# Patient Record
Sex: Male | Born: 1935 | Race: White | Hispanic: No | Marital: Married | Smoking: Former smoker
Health system: Southern US, Community
[De-identification: ages and names within clinical notes are randomized; demographics above are authoritative.]

## PROBLEM LIST (undated history)

## (undated) DIAGNOSIS — I5032 Chronic diastolic (congestive) heart failure: Secondary | ICD-10-CM

## (undated) DIAGNOSIS — I10 Essential (primary) hypertension: Secondary | ICD-10-CM

## (undated) DIAGNOSIS — I350 Nonrheumatic aortic (valve) stenosis: Secondary | ICD-10-CM

## (undated) DIAGNOSIS — E119 Type 2 diabetes mellitus without complications: Secondary | ICD-10-CM

## (undated) DIAGNOSIS — R112 Nausea with vomiting, unspecified: Secondary | ICD-10-CM

## (undated) DIAGNOSIS — I2510 Atherosclerotic heart disease of native coronary artery without angina pectoris: Secondary | ICD-10-CM

## (undated) DIAGNOSIS — D509 Iron deficiency anemia, unspecified: Secondary | ICD-10-CM

## (undated) DIAGNOSIS — N400 Enlarged prostate without lower urinary tract symptoms: Secondary | ICD-10-CM

## (undated) DIAGNOSIS — E663 Overweight: Secondary | ICD-10-CM

## (undated) DIAGNOSIS — K222 Esophageal obstruction: Secondary | ICD-10-CM

## (undated) DIAGNOSIS — M1990 Unspecified osteoarthritis, unspecified site: Secondary | ICD-10-CM

## (undated) DIAGNOSIS — K635 Polyp of colon: Secondary | ICD-10-CM

## (undated) DIAGNOSIS — M545 Low back pain, unspecified: Secondary | ICD-10-CM

## (undated) DIAGNOSIS — N183 Chronic kidney disease, stage 3 unspecified: Secondary | ICD-10-CM

## (undated) DIAGNOSIS — Z9289 Personal history of other medical treatment: Secondary | ICD-10-CM

## (undated) DIAGNOSIS — G629 Polyneuropathy, unspecified: Secondary | ICD-10-CM

## (undated) DIAGNOSIS — R0600 Dyspnea, unspecified: Secondary | ICD-10-CM

## (undated) DIAGNOSIS — M059 Rheumatoid arthritis with rheumatoid factor, unspecified: Secondary | ICD-10-CM

## (undated) DIAGNOSIS — Z87442 Personal history of urinary calculi: Secondary | ICD-10-CM

## (undated) DIAGNOSIS — E785 Hyperlipidemia, unspecified: Secondary | ICD-10-CM

## (undated) DIAGNOSIS — Z8669 Personal history of other diseases of the nervous system and sense organs: Secondary | ICD-10-CM

## (undated) DIAGNOSIS — Z9981 Dependence on supplemental oxygen: Secondary | ICD-10-CM

## (undated) DIAGNOSIS — K5790 Diverticulosis of intestine, part unspecified, without perforation or abscess without bleeding: Secondary | ICD-10-CM

## (undated) DIAGNOSIS — R011 Cardiac murmur, unspecified: Secondary | ICD-10-CM

## (undated) DIAGNOSIS — J849 Interstitial pulmonary disease, unspecified: Secondary | ICD-10-CM

## (undated) DIAGNOSIS — K449 Diaphragmatic hernia without obstruction or gangrene: Secondary | ICD-10-CM

## (undated) DIAGNOSIS — Z972 Presence of dental prosthetic device (complete) (partial): Secondary | ICD-10-CM

## (undated) DIAGNOSIS — K219 Gastro-esophageal reflux disease without esophagitis: Secondary | ICD-10-CM

## (undated) DIAGNOSIS — M069 Rheumatoid arthritis, unspecified: Secondary | ICD-10-CM

## (undated) DIAGNOSIS — I219 Acute myocardial infarction, unspecified: Secondary | ICD-10-CM

## (undated) DIAGNOSIS — U071 COVID-19: Secondary | ICD-10-CM

## (undated) DIAGNOSIS — C801 Malignant (primary) neoplasm, unspecified: Secondary | ICD-10-CM

## (undated) DIAGNOSIS — Z973 Presence of spectacles and contact lenses: Secondary | ICD-10-CM

## (undated) DIAGNOSIS — M810 Age-related osteoporosis without current pathological fracture: Secondary | ICD-10-CM

## (undated) DIAGNOSIS — H269 Unspecified cataract: Secondary | ICD-10-CM

## (undated) DIAGNOSIS — G8929 Other chronic pain: Secondary | ICD-10-CM

## (undated) HISTORY — DX: Rheumatoid arthritis with rheumatoid factor, unspecified: M05.9

## (undated) HISTORY — DX: Personal history of other medical treatment: Z92.89

## (undated) HISTORY — DX: Gastro-esophageal reflux disease without esophagitis: K21.9

## (undated) HISTORY — PX: HAND SURGERY: SHX662

## (undated) HISTORY — DX: Nausea with vomiting, unspecified: R11.2

## (undated) HISTORY — DX: Rheumatoid arthritis, unspecified: M06.9

## (undated) HISTORY — DX: Unspecified osteoarthritis, unspecified site: M19.90

## (undated) HISTORY — DX: Overweight: E66.3

## (undated) HISTORY — DX: Atherosclerotic heart disease of native coronary artery without angina pectoris: I25.10

## (undated) HISTORY — DX: Unspecified cataract: H26.9

## (undated) HISTORY — DX: Esophageal obstruction: K22.2

## (undated) HISTORY — DX: Diaphragmatic hernia without obstruction or gangrene: K44.9

## (undated) HISTORY — DX: Age-related osteoporosis without current pathological fracture: M81.0

## (undated) HISTORY — PX: JOINT REPLACEMENT: SHX530

## (undated) HISTORY — DX: Polyp of colon: K63.5

## (undated) HISTORY — DX: Interstitial pulmonary disease, unspecified: J84.9

## (undated) HISTORY — DX: Diverticulosis of intestine, part unspecified, without perforation or abscess without bleeding: K57.90

## (undated) HISTORY — DX: Essential (primary) hypertension: I10

## (undated) HISTORY — PX: CATARACT EXTRACTION W/ INTRAOCULAR LENS  IMPLANT, BILATERAL: SHX1307

## (undated) HISTORY — DX: Dyspnea, unspecified: R06.00

## (undated) HISTORY — DX: Hyperlipidemia, unspecified: E78.5

---

## 1998-01-22 ENCOUNTER — Encounter: Payer: Self-pay | Admitting: Emergency Medicine

## 1998-01-22 ENCOUNTER — Emergency Department (HOSPITAL_COMMUNITY): Admission: EM | Admit: 1998-01-22 | Discharge: 1998-01-22 | Payer: Self-pay | Admitting: Emergency Medicine

## 1998-07-01 ENCOUNTER — Encounter: Payer: Self-pay | Admitting: Urology

## 1998-07-01 ENCOUNTER — Ambulatory Visit (HOSPITAL_COMMUNITY): Admission: RE | Admit: 1998-07-01 | Discharge: 1998-07-01 | Payer: Self-pay | Admitting: Urology

## 1999-10-30 ENCOUNTER — Ambulatory Visit (HOSPITAL_COMMUNITY): Admission: RE | Admit: 1999-10-30 | Discharge: 1999-10-30 | Payer: Self-pay | Admitting: Family Medicine

## 1999-10-30 ENCOUNTER — Encounter: Payer: Self-pay | Admitting: Family Medicine

## 1999-11-04 HISTORY — PX: CORONARY ARTERY BYPASS GRAFT: SHX141

## 1999-11-06 ENCOUNTER — Encounter: Admission: RE | Admit: 1999-11-06 | Discharge: 1999-11-06 | Payer: Self-pay | Admitting: Family Medicine

## 1999-11-06 ENCOUNTER — Encounter: Payer: Self-pay | Admitting: Family Medicine

## 1999-11-30 ENCOUNTER — Encounter: Payer: Self-pay | Admitting: Neurosurgery

## 1999-11-30 ENCOUNTER — Ambulatory Visit (HOSPITAL_COMMUNITY): Admission: RE | Admit: 1999-11-30 | Discharge: 1999-11-30 | Payer: Self-pay | Admitting: Neurosurgery

## 1999-12-14 ENCOUNTER — Ambulatory Visit (HOSPITAL_COMMUNITY): Admission: RE | Admit: 1999-12-14 | Discharge: 1999-12-14 | Payer: Self-pay | Admitting: Neurosurgery

## 1999-12-14 ENCOUNTER — Encounter: Payer: Self-pay | Admitting: Neurosurgery

## 1999-12-29 ENCOUNTER — Ambulatory Visit (HOSPITAL_COMMUNITY): Admission: RE | Admit: 1999-12-29 | Discharge: 1999-12-29 | Payer: Self-pay | Admitting: Neurosurgery

## 1999-12-29 ENCOUNTER — Encounter: Payer: Self-pay | Admitting: Neurosurgery

## 2000-01-19 ENCOUNTER — Ambulatory Visit (HOSPITAL_COMMUNITY): Admission: RE | Admit: 2000-01-19 | Discharge: 2000-01-19 | Payer: Self-pay | Admitting: Vascular Surgery

## 2000-01-19 ENCOUNTER — Encounter: Payer: Self-pay | Admitting: Vascular Surgery

## 2000-11-16 ENCOUNTER — Encounter: Payer: Self-pay | Admitting: Cardiology

## 2000-11-17 ENCOUNTER — Encounter: Payer: Self-pay | Admitting: Thoracic Surgery (Cardiothoracic Vascular Surgery)

## 2000-11-17 ENCOUNTER — Inpatient Hospital Stay (HOSPITAL_COMMUNITY): Admission: RE | Admit: 2000-11-17 | Discharge: 2000-11-21 | Payer: Self-pay | Admitting: Cardiology

## 2000-11-18 ENCOUNTER — Encounter: Payer: Self-pay | Admitting: Thoracic Surgery (Cardiothoracic Vascular Surgery)

## 2000-11-19 ENCOUNTER — Encounter: Payer: Self-pay | Admitting: Thoracic Surgery (Cardiothoracic Vascular Surgery)

## 2000-12-12 ENCOUNTER — Encounter: Admission: RE | Admit: 2000-12-12 | Discharge: 2000-12-12 | Payer: Self-pay | Admitting: Cardiovascular Disease

## 2001-06-07 ENCOUNTER — Encounter: Admission: RE | Admit: 2001-06-07 | Discharge: 2001-06-07 | Payer: Self-pay | Admitting: Internal Medicine

## 2001-06-07 ENCOUNTER — Encounter: Payer: Self-pay | Admitting: Internal Medicine

## 2001-09-25 ENCOUNTER — Encounter: Payer: Self-pay | Admitting: Internal Medicine

## 2001-12-13 ENCOUNTER — Encounter: Admission: RE | Admit: 2001-12-13 | Discharge: 2001-12-13 | Payer: Self-pay | Admitting: Internal Medicine

## 2003-01-23 ENCOUNTER — Encounter: Admission: RE | Admit: 2003-01-23 | Discharge: 2003-01-23 | Payer: Self-pay | Admitting: Orthopedic Surgery

## 2003-01-23 ENCOUNTER — Encounter: Payer: Self-pay | Admitting: Orthopedic Surgery

## 2003-02-22 ENCOUNTER — Encounter: Admission: RE | Admit: 2003-02-22 | Discharge: 2003-02-22 | Payer: Self-pay | Admitting: Orthopedic Surgery

## 2003-02-25 ENCOUNTER — Ambulatory Visit (HOSPITAL_BASED_OUTPATIENT_CLINIC_OR_DEPARTMENT_OTHER): Admission: RE | Admit: 2003-02-25 | Discharge: 2003-02-25 | Payer: Self-pay | Admitting: Orthopedic Surgery

## 2003-02-25 ENCOUNTER — Ambulatory Visit (HOSPITAL_COMMUNITY): Admission: RE | Admit: 2003-02-25 | Discharge: 2003-02-25 | Payer: Self-pay | Admitting: Orthopedic Surgery

## 2003-03-19 ENCOUNTER — Encounter: Admission: RE | Admit: 2003-03-19 | Discharge: 2003-06-17 | Payer: Self-pay | Admitting: Internal Medicine

## 2003-11-04 HISTORY — PX: CHOLECYSTECTOMY OPEN: SUR202

## 2004-02-28 ENCOUNTER — Ambulatory Visit: Payer: Self-pay | Admitting: Internal Medicine

## 2004-05-01 ENCOUNTER — Ambulatory Visit: Payer: Self-pay | Admitting: Internal Medicine

## 2004-05-21 ENCOUNTER — Ambulatory Visit: Payer: Self-pay | Admitting: Internal Medicine

## 2004-08-03 HISTORY — PX: CARDIAC CATHETERIZATION: SHX172

## 2004-08-17 ENCOUNTER — Inpatient Hospital Stay (HOSPITAL_COMMUNITY): Admission: EM | Admit: 2004-08-17 | Discharge: 2004-08-20 | Payer: Self-pay | Admitting: Emergency Medicine

## 2004-08-17 ENCOUNTER — Ambulatory Visit: Payer: Self-pay | Admitting: Family Medicine

## 2004-08-18 ENCOUNTER — Ambulatory Visit: Payer: Self-pay | Admitting: Cardiovascular Disease

## 2004-08-27 ENCOUNTER — Ambulatory Visit: Payer: Self-pay | Admitting: Internal Medicine

## 2004-09-03 ENCOUNTER — Ambulatory Visit: Payer: Self-pay | Admitting: Internal Medicine

## 2004-10-12 ENCOUNTER — Ambulatory Visit: Payer: Self-pay | Admitting: Internal Medicine

## 2004-11-06 ENCOUNTER — Ambulatory Visit: Payer: Self-pay | Admitting: Cardiovascular Disease

## 2004-12-11 ENCOUNTER — Ambulatory Visit: Payer: Self-pay | Admitting: Internal Medicine

## 2004-12-17 ENCOUNTER — Ambulatory Visit: Payer: Self-pay | Admitting: Internal Medicine

## 2004-12-31 ENCOUNTER — Ambulatory Visit: Payer: Self-pay | Admitting: Internal Medicine

## 2004-12-31 ENCOUNTER — Ambulatory Visit (HOSPITAL_COMMUNITY): Admission: RE | Admit: 2004-12-31 | Discharge: 2004-12-31 | Payer: Self-pay | Admitting: Internal Medicine

## 2005-01-07 ENCOUNTER — Ambulatory Visit: Payer: Self-pay | Admitting: Cardiovascular Disease

## 2005-01-15 ENCOUNTER — Ambulatory Visit: Payer: Self-pay | Admitting: Internal Medicine

## 2005-01-23 ENCOUNTER — Ambulatory Visit: Payer: Self-pay

## 2005-03-02 ENCOUNTER — Ambulatory Visit: Payer: Self-pay | Admitting: Internal Medicine

## 2005-08-31 ENCOUNTER — Ambulatory Visit: Payer: Self-pay | Admitting: Internal Medicine

## 2005-10-19 ENCOUNTER — Ambulatory Visit: Payer: Self-pay | Admitting: Internal Medicine

## 2005-11-10 ENCOUNTER — Ambulatory Visit: Payer: Self-pay | Admitting: Internal Medicine

## 2005-11-17 ENCOUNTER — Ambulatory Visit: Payer: Self-pay | Admitting: Internal Medicine

## 2006-02-02 ENCOUNTER — Ambulatory Visit: Payer: Self-pay | Admitting: Internal Medicine

## 2006-03-17 ENCOUNTER — Ambulatory Visit: Payer: Self-pay | Admitting: Internal Medicine

## 2006-04-05 HISTORY — PX: KNEE ARTHROSCOPY: SHX127

## 2006-04-08 ENCOUNTER — Ambulatory Visit: Payer: Self-pay | Admitting: Family Medicine

## 2006-04-19 ENCOUNTER — Ambulatory Visit: Payer: Self-pay | Admitting: Cardiovascular Disease

## 2006-04-29 ENCOUNTER — Encounter: Payer: Self-pay | Admitting: Cardiology

## 2006-04-29 ENCOUNTER — Ambulatory Visit: Payer: Self-pay

## 2006-05-17 ENCOUNTER — Ambulatory Visit: Payer: Self-pay | Admitting: Internal Medicine

## 2006-05-17 LAB — CONVERTED CEMR LAB
Basophils Relative: 0 % (ref 0.0–1.0)
Eosinophils Relative: 5.7 % — ABNORMAL HIGH (ref 0.0–5.0)
Hemoglobin: 14.4 g/dL (ref 13.0–17.0)
Lymphocytes Relative: 35.8 % (ref 12.0–46.0)
Monocytes Absolute: 0.7 10*3/uL (ref 0.2–0.7)
Neutro Abs: 3.6 10*3/uL (ref 1.4–7.7)
RDW: 13.7 % (ref 11.5–14.6)
Sed Rate: 11 mm/hr (ref 0–20)
WBC: 7.3 10*3/uL (ref 4.5–10.5)

## 2006-09-23 DIAGNOSIS — I10 Essential (primary) hypertension: Secondary | ICD-10-CM

## 2006-09-23 DIAGNOSIS — E7849 Other hyperlipidemia: Secondary | ICD-10-CM

## 2006-09-23 DIAGNOSIS — K219 Gastro-esophageal reflux disease without esophagitis: Secondary | ICD-10-CM

## 2006-09-23 DIAGNOSIS — N400 Enlarged prostate without lower urinary tract symptoms: Secondary | ICD-10-CM

## 2006-10-17 ENCOUNTER — Ambulatory Visit: Payer: Self-pay | Admitting: Cardiovascular Disease

## 2006-10-17 ENCOUNTER — Ambulatory Visit: Payer: Self-pay | Admitting: Internal Medicine

## 2006-10-17 DIAGNOSIS — G479 Sleep disorder, unspecified: Secondary | ICD-10-CM | POA: Insufficient documentation

## 2006-10-18 LAB — CONVERTED CEMR LAB
ALT: 19 units/L (ref 0–53)
Albumin: 4 g/dL (ref 3.5–5.2)
BUN: 24 mg/dL — ABNORMAL HIGH (ref 6–23)
CO2: 30 meq/L (ref 19–32)
Cholesterol: 151 mg/dL (ref 0–200)
Creatinine, Ser: 1.1 mg/dL (ref 0.4–1.5)
Creatinine,U: 157.8 mg/dL
Hgb A1c MFr Bld: 6.7 % — ABNORMAL HIGH (ref 4.6–6.0)
LDL Cholesterol: 80 mg/dL (ref 0–99)
Phosphorus: 3.5 mg/dL (ref 2.3–4.6)
Potassium: 4.6 meq/L (ref 3.5–5.1)
Sodium: 139 meq/L (ref 135–145)
Total CHOL/HDL Ratio: 4
Triglycerides: 170 mg/dL — ABNORMAL HIGH (ref 0–149)

## 2007-01-17 ENCOUNTER — Telehealth (INDEPENDENT_AMBULATORY_CARE_PROVIDER_SITE_OTHER): Payer: Self-pay | Admitting: *Deleted

## 2007-02-03 ENCOUNTER — Ambulatory Visit: Payer: Self-pay | Admitting: Internal Medicine

## 2007-02-08 ENCOUNTER — Encounter: Payer: Self-pay | Admitting: Internal Medicine

## 2007-04-06 HISTORY — PX: CARDIAC CATHETERIZATION: SHX172

## 2007-04-17 ENCOUNTER — Ambulatory Visit: Payer: Self-pay

## 2007-04-17 ENCOUNTER — Encounter: Payer: Self-pay | Admitting: Internal Medicine

## 2007-04-21 ENCOUNTER — Ambulatory Visit: Payer: Self-pay | Admitting: Internal Medicine

## 2007-04-21 ENCOUNTER — Telehealth: Payer: Self-pay | Admitting: Internal Medicine

## 2007-04-24 LAB — CONVERTED CEMR LAB
ALT: 21 units/L (ref 0–53)
Albumin: 4 g/dL (ref 3.5–5.2)
Alkaline Phosphatase: 62 units/L (ref 39–117)
Basophils Absolute: 0 10*3/uL (ref 0.0–0.1)
Calcium: 10.1 mg/dL (ref 8.4–10.5)
Chloride: 104 meq/L (ref 96–112)
Creatinine, Ser: 0.9 mg/dL (ref 0.4–1.5)
Eosinophils Absolute: 0 10*3/uL (ref 0.0–0.6)
GFR calc Af Amer: 106 mL/min
Glucose, Bld: 190 mg/dL — ABNORMAL HIGH (ref 70–99)
HDL: 43.1 mg/dL (ref >39.0)
Lymphocytes Relative: 8.1 % — ABNORMAL LOW (ref 12.0–46.0)
MCHC: 34.7 g/dL (ref 30.0–36.0)
MCV: 86.3 fL (ref 78.0–100.0)
Monocytes Relative: 2.6 % — ABNORMAL LOW (ref 3.0–11.0)
Neutro Abs: 16.6 10*3/uL — ABNORMAL HIGH (ref 1.4–7.7)
Platelets: 212 10*3/uL (ref 150–400)
RBC: 5.17 M/uL (ref 4.22–5.81)
TSH: 0.59 microintl units/mL (ref 0.35–5.50)
Total CHOL/HDL Ratio: 3.6
Total Protein: 8.1 g/dL (ref 6.0–8.3)
Triglycerides: 78 mg/dL (ref 0–149)

## 2007-04-25 ENCOUNTER — Ambulatory Visit: Payer: Self-pay | Admitting: Cardiovascular Disease

## 2007-06-23 ENCOUNTER — Ambulatory Visit: Payer: Self-pay | Admitting: Internal Medicine

## 2007-10-23 ENCOUNTER — Ambulatory Visit: Payer: Self-pay | Admitting: Internal Medicine

## 2007-10-23 DIAGNOSIS — L570 Actinic keratosis: Secondary | ICD-10-CM | POA: Insufficient documentation

## 2007-10-25 LAB — CONVERTED CEMR LAB
Albumin: 4 g/dL (ref 3.5–5.2)
Basophils Absolute: 0.1 10*3/uL (ref 0.0–0.1)
Basophils Relative: 1 % (ref 0.0–3.0)
CO2: 27 meq/L (ref 19–32)
Chloride: 105 meq/L (ref 96–112)
Cholesterol: 155 mg/dL (ref 0–200)
Creatinine,U: 125.7 mg/dL
Eosinophils Absolute: 0.4 10*3/uL (ref 0.0–0.7)
GFR calc non Af Amer: 78 mL/min
Hemoglobin: 15.2 g/dL (ref 13.0–17.0)
Hgb A1c MFr Bld: 7.2 % — ABNORMAL HIGH (ref 4.6–6.0)
MCHC: 34.2 g/dL (ref 30.0–36.0)
MCV: 86.2 fL (ref 78.0–100.0)
Microalb Creat Ratio: 4.8 mg/g (ref 0.0–30.0)
Microalb, Ur: 0.6 mg/dL (ref 0.0–1.9)
Neutro Abs: 3.5 10*3/uL (ref 1.4–7.7)
Neutrophils Relative %: 48.5 % (ref 43.0–77.0)
RBC: 5.16 M/uL (ref 4.22–5.81)
RDW: 13.9 % (ref 11.5–14.6)
Sodium: 141 meq/L (ref 135–145)
Total Bilirubin: 0.8 mg/dL (ref 0.3–1.2)
Triglycerides: 98 mg/dL (ref 0–149)
VLDL: 20 mg/dL (ref 0–40)

## 2007-10-30 ENCOUNTER — Ambulatory Visit: Payer: Self-pay | Admitting: Cardiovascular Disease

## 2007-12-20 ENCOUNTER — Ambulatory Visit: Payer: Self-pay | Admitting: Family Medicine

## 2007-12-20 ENCOUNTER — Encounter (INDEPENDENT_AMBULATORY_CARE_PROVIDER_SITE_OTHER): Payer: Self-pay | Admitting: Internal Medicine

## 2007-12-21 LAB — CONVERTED CEMR LAB
Calcium: 10.3 mg/dL (ref 8.4–10.5)
Creatinine, Ser: 1 mg/dL (ref 0.4–1.5)
GFR calc Af Amer: 94 mL/min
GFR calc non Af Amer: 78 mL/min

## 2007-12-22 ENCOUNTER — Ambulatory Visit: Payer: Self-pay | Admitting: Cardiovascular Disease

## 2008-01-01 ENCOUNTER — Ambulatory Visit: Payer: Self-pay | Admitting: Family Medicine

## 2008-01-08 ENCOUNTER — Ambulatory Visit: Payer: Self-pay | Admitting: Family Medicine

## 2008-01-09 ENCOUNTER — Ambulatory Visit: Payer: Self-pay | Admitting: Cardiovascular Disease

## 2008-01-09 ENCOUNTER — Ambulatory Visit: Payer: Self-pay | Admitting: Internal Medicine

## 2008-01-09 LAB — CONVERTED CEMR LAB
Basophils Absolute: 0.1 10*3/uL (ref 0.0–0.1)
CO2: 30 meq/L (ref 19–32)
Calcium: 9.5 mg/dL (ref 8.4–10.5)
GFR calc Af Amer: 84 mL/min
Hemoglobin: 15.5 g/dL (ref 13.0–17.0)
Lymphocytes Relative: 28.1 % (ref 12.0–46.0)
MCHC: 35.3 g/dL (ref 30.0–36.0)
Neutro Abs: 5.7 10*3/uL (ref 1.4–7.7)
Prothrombin Time: 13 s (ref 10.9–13.3)
RDW: 12.7 % (ref 11.5–14.6)
Sodium: 137 meq/L (ref 135–145)
aPTT: 27.3 s (ref 21.7–29.8)

## 2008-01-10 ENCOUNTER — Inpatient Hospital Stay (HOSPITAL_BASED_OUTPATIENT_CLINIC_OR_DEPARTMENT_OTHER): Admission: RE | Admit: 2008-01-10 | Discharge: 2008-01-10 | Payer: Self-pay | Admitting: Cardiovascular Disease

## 2008-01-10 ENCOUNTER — Ambulatory Visit: Payer: Self-pay | Admitting: Cardiovascular Disease

## 2008-01-16 ENCOUNTER — Ambulatory Visit: Payer: Self-pay | Admitting: Family Medicine

## 2008-01-19 ENCOUNTER — Ambulatory Visit: Payer: Self-pay | Admitting: Cardiology

## 2008-01-24 ENCOUNTER — Ambulatory Visit: Payer: Self-pay | Admitting: Internal Medicine

## 2008-01-24 ENCOUNTER — Ambulatory Visit: Payer: Self-pay | Admitting: Cardiology

## 2008-01-24 DIAGNOSIS — J8409 Other alveolar and parieto-alveolar conditions: Secondary | ICD-10-CM | POA: Insufficient documentation

## 2008-01-24 DIAGNOSIS — J9620 Acute and chronic respiratory failure, unspecified whether with hypoxia or hypercapnia: Secondary | ICD-10-CM | POA: Insufficient documentation

## 2008-01-30 ENCOUNTER — Ambulatory Visit: Payer: Self-pay | Admitting: Internal Medicine

## 2008-01-31 LAB — CONVERTED CEMR LAB: Hgb A1c MFr Bld: 7.6 % — ABNORMAL HIGH (ref 4.6–6.0)

## 2008-02-05 ENCOUNTER — Encounter (INDEPENDENT_AMBULATORY_CARE_PROVIDER_SITE_OTHER): Payer: Self-pay | Admitting: *Deleted

## 2008-02-05 ENCOUNTER — Telehealth: Payer: Self-pay | Admitting: Internal Medicine

## 2008-02-06 ENCOUNTER — Telehealth: Payer: Self-pay | Admitting: Internal Medicine

## 2008-03-19 ENCOUNTER — Encounter: Payer: Self-pay | Admitting: Cardiovascular Disease

## 2008-03-19 ENCOUNTER — Ambulatory Visit: Payer: Self-pay | Admitting: Cardiovascular Disease

## 2008-03-19 ENCOUNTER — Emergency Department (HOSPITAL_COMMUNITY): Admission: EM | Admit: 2008-03-19 | Discharge: 2008-03-19 | Payer: Self-pay | Admitting: Family Medicine

## 2008-03-19 DIAGNOSIS — I2510 Atherosclerotic heart disease of native coronary artery without angina pectoris: Secondary | ICD-10-CM | POA: Insufficient documentation

## 2008-03-19 DIAGNOSIS — I700 Atherosclerosis of aorta: Secondary | ICD-10-CM | POA: Insufficient documentation

## 2008-03-19 DIAGNOSIS — I350 Nonrheumatic aortic (valve) stenosis: Secondary | ICD-10-CM | POA: Insufficient documentation

## 2008-03-19 LAB — CONVERTED CEMR LAB
Calcium: 9.7 mg/dL (ref 8.4–10.5)
Chloride: 101 meq/L (ref 96–112)
Creatinine, Ser: 1 mg/dL (ref 0.4–1.5)
GFR calc Af Amer: 94 mL/min
GFR calc non Af Amer: 78 mL/min
Sed Rate: 17 mm/hr — ABNORMAL HIGH (ref 0–16)

## 2008-03-26 ENCOUNTER — Ambulatory Visit: Payer: Self-pay | Admitting: Internal Medicine

## 2008-03-27 ENCOUNTER — Ambulatory Visit: Payer: Self-pay | Admitting: Internal Medicine

## 2008-03-27 ENCOUNTER — Encounter (INDEPENDENT_AMBULATORY_CARE_PROVIDER_SITE_OTHER): Payer: Self-pay | Admitting: *Deleted

## 2008-04-05 HISTORY — PX: ESOPHAGOGASTRODUODENOSCOPY (EGD) WITH ESOPHAGEAL DILATION: SHX5812

## 2008-04-09 ENCOUNTER — Ambulatory Visit: Payer: Self-pay | Admitting: Family Medicine

## 2008-04-30 ENCOUNTER — Ambulatory Visit: Payer: Self-pay | Admitting: Internal Medicine

## 2008-05-06 ENCOUNTER — Ambulatory Visit (HOSPITAL_COMMUNITY): Admission: RE | Admit: 2008-05-06 | Discharge: 2008-05-06 | Payer: Self-pay | Admitting: Internal Medicine

## 2008-05-06 ENCOUNTER — Encounter: Payer: Self-pay | Admitting: Internal Medicine

## 2008-05-14 ENCOUNTER — Ambulatory Visit: Payer: Self-pay | Admitting: Internal Medicine

## 2008-05-24 ENCOUNTER — Ambulatory Visit: Payer: Self-pay | Admitting: Internal Medicine

## 2008-05-31 ENCOUNTER — Telehealth: Payer: Self-pay | Admitting: Internal Medicine

## 2008-06-13 ENCOUNTER — Ambulatory Visit: Payer: Self-pay | Admitting: Family Medicine

## 2008-08-02 ENCOUNTER — Ambulatory Visit: Payer: Self-pay | Admitting: Internal Medicine

## 2008-08-02 ENCOUNTER — Encounter (INDEPENDENT_AMBULATORY_CARE_PROVIDER_SITE_OTHER): Payer: Self-pay | Admitting: *Deleted

## 2008-08-02 LAB — CONVERTED CEMR LAB
AST: 31 units/L (ref 0–37)
Alkaline Phosphatase: 55 units/L (ref 39–117)
Basophils Relative: 0.8 % (ref 0.0–3.0)
Bilirubin, Direct: 0.2 mg/dL (ref 0.0–0.3)
CO2: 29 meq/L (ref 19–32)
Calcium: 9.6 mg/dL (ref 8.4–10.5)
Creatinine, Ser: 1.2 mg/dL (ref 0.4–1.5)
Eosinophils Absolute: 0.4 10*3/uL (ref 0.0–0.7)
Glucose, Bld: 204 mg/dL — ABNORMAL HIGH (ref 70–99)
HCT: 43.4 % (ref 39.0–52.0)
Hgb A1c MFr Bld: 9 % — ABNORMAL HIGH (ref 4.6–6.5)
Lymphs Abs: 2.5 10*3/uL (ref 0.7–4.0)
MCHC: 34.4 g/dL (ref 30.0–36.0)
MCV: 84.8 fL (ref 78.0–100.0)
Monocytes Absolute: 0.7 10*3/uL (ref 0.1–1.0)
Neutrophils Relative %: 53.3 % (ref 43.0–77.0)
Platelets: 189 10*3/uL (ref 150.0–400.0)
Total Bilirubin: 1.1 mg/dL (ref 0.3–1.2)
Total CHOL/HDL Ratio: 4

## 2008-08-05 ENCOUNTER — Encounter: Payer: Self-pay | Admitting: Internal Medicine

## 2008-08-13 ENCOUNTER — Telehealth: Payer: Self-pay | Admitting: Internal Medicine

## 2008-09-04 ENCOUNTER — Ambulatory Visit: Payer: Self-pay | Admitting: Cardiovascular Disease

## 2008-09-04 ENCOUNTER — Encounter: Payer: Self-pay | Admitting: Cardiovascular Disease

## 2008-09-04 ENCOUNTER — Telehealth: Payer: Self-pay | Admitting: Internal Medicine

## 2008-09-04 ENCOUNTER — Ambulatory Visit: Payer: Self-pay

## 2008-09-10 ENCOUNTER — Ambulatory Visit: Payer: Self-pay | Admitting: Cardiology

## 2008-09-10 ENCOUNTER — Ambulatory Visit: Payer: Self-pay | Admitting: Internal Medicine

## 2008-09-10 DIAGNOSIS — K2100 Gastro-esophageal reflux disease with esophagitis, without bleeding: Secondary | ICD-10-CM | POA: Insufficient documentation

## 2008-09-10 DIAGNOSIS — K5730 Diverticulosis of large intestine without perforation or abscess without bleeding: Secondary | ICD-10-CM

## 2008-09-11 ENCOUNTER — Encounter: Payer: Self-pay | Admitting: Pulmonary Disease

## 2008-09-12 ENCOUNTER — Telehealth (INDEPENDENT_AMBULATORY_CARE_PROVIDER_SITE_OTHER): Payer: Self-pay | Admitting: *Deleted

## 2008-09-16 ENCOUNTER — Ambulatory Visit: Payer: Self-pay | Admitting: Internal Medicine

## 2008-10-01 ENCOUNTER — Encounter: Payer: Self-pay | Admitting: Internal Medicine

## 2008-10-09 ENCOUNTER — Ambulatory Visit: Payer: Self-pay | Admitting: Internal Medicine

## 2008-10-09 DIAGNOSIS — K222 Esophageal obstruction: Secondary | ICD-10-CM

## 2008-10-21 ENCOUNTER — Ambulatory Visit: Payer: Self-pay | Admitting: Internal Medicine

## 2008-11-04 ENCOUNTER — Telehealth: Payer: Self-pay | Admitting: Internal Medicine

## 2008-11-05 ENCOUNTER — Ambulatory Visit: Payer: Self-pay | Admitting: Internal Medicine

## 2008-11-05 ENCOUNTER — Encounter: Payer: Self-pay | Admitting: Cardiovascular Disease

## 2008-11-05 ENCOUNTER — Encounter: Payer: Self-pay | Admitting: Internal Medicine

## 2008-11-08 ENCOUNTER — Encounter: Payer: Self-pay | Admitting: Internal Medicine

## 2008-11-25 ENCOUNTER — Ambulatory Visit: Payer: Self-pay | Admitting: Internal Medicine

## 2008-12-11 ENCOUNTER — Telehealth: Payer: Self-pay | Admitting: Cardiovascular Disease

## 2008-12-16 ENCOUNTER — Ambulatory Visit: Payer: Self-pay | Admitting: Pulmonary Disease

## 2008-12-17 ENCOUNTER — Ambulatory Visit: Payer: Self-pay | Admitting: Pulmonary Disease

## 2008-12-17 DIAGNOSIS — G4733 Obstructive sleep apnea (adult) (pediatric): Secondary | ICD-10-CM | POA: Insufficient documentation

## 2009-01-01 ENCOUNTER — Ambulatory Visit: Payer: Self-pay | Admitting: Internal Medicine

## 2009-02-04 ENCOUNTER — Ambulatory Visit: Payer: Self-pay | Admitting: Internal Medicine

## 2009-02-07 LAB — CONVERTED CEMR LAB
ALT: 35 units/L (ref 0–53)
Albumin: 3.9 g/dL (ref 3.5–5.2)
BUN: 22 mg/dL (ref 6–23)
Basophils Relative: 0.5 % (ref 0.0–3.0)
Bilirubin, Direct: 0.2 mg/dL (ref 0.0–0.3)
Chloride: 99 meq/L (ref 96–112)
Cholesterol: 136 mg/dL (ref 0–200)
Eosinophils Relative: 5.1 % — ABNORMAL HIGH (ref 0.0–5.0)
GFR calc non Af Amer: 69.27 mL/min (ref >60)
HCT: 43.8 % (ref 39.0–52.0)
HDL: 36.5 mg/dL — ABNORMAL LOW (ref >39.00)
Hemoglobin: 15.5 g/dL (ref 13.0–17.0)
LDL Cholesterol: 81 mg/dL (ref 0–99)
Lymphocytes Relative: 32.1 % (ref 12.0–46.0)
Lymphs Abs: 2.7 10*3/uL (ref 0.7–4.0)
Microalb, Ur: 0.5 mg/dL (ref 0.0–1.9)
Monocytes Relative: 8.5 % (ref 3.0–12.0)
Neutro Abs: 4.7 10*3/uL (ref 1.4–7.7)
Phosphorus: 2.8 mg/dL (ref 2.3–4.6)
Potassium: 4.3 meq/L (ref 3.5–5.1)
RBC: 5.17 M/uL (ref 4.22–5.81)
Sodium: 136 meq/L (ref 135–145)
Total Protein: 7.8 g/dL (ref 6.0–8.3)
VLDL: 18.4 mg/dL (ref 0.0–40.0)
WBC: 8.5 10*3/uL (ref 4.5–10.5)

## 2009-02-11 ENCOUNTER — Telehealth: Payer: Self-pay | Admitting: Internal Medicine

## 2009-02-17 ENCOUNTER — Ambulatory Visit: Payer: Self-pay | Admitting: Cardiovascular Disease

## 2009-02-20 LAB — CONVERTED CEMR LAB
BUN: 18 mg/dL (ref 6–23)
CO2: 28 meq/L (ref 19–32)
Chloride: 95 meq/L — ABNORMAL LOW (ref 96–112)
Creatinine, Ser: 1 mg/dL (ref 0.4–1.5)
Potassium: 4 meq/L (ref 3.5–5.1)
Pro B Natriuretic peptide (BNP): 18 pg/mL (ref 0.0–100.0)

## 2009-03-07 ENCOUNTER — Ambulatory Visit (HOSPITAL_COMMUNITY): Admission: RE | Admit: 2009-03-07 | Discharge: 2009-03-07 | Payer: Self-pay | Admitting: Cardiovascular Disease

## 2009-03-07 ENCOUNTER — Encounter: Payer: Self-pay | Admitting: Cardiovascular Disease

## 2009-03-07 ENCOUNTER — Ambulatory Visit: Payer: Self-pay

## 2009-03-07 ENCOUNTER — Ambulatory Visit: Payer: Self-pay | Admitting: Cardiology

## 2009-03-25 ENCOUNTER — Encounter: Payer: Self-pay | Admitting: Internal Medicine

## 2009-03-26 ENCOUNTER — Encounter: Payer: Self-pay | Admitting: Internal Medicine

## 2009-04-02 ENCOUNTER — Telehealth: Payer: Self-pay | Admitting: Internal Medicine

## 2009-04-03 ENCOUNTER — Ambulatory Visit: Payer: Self-pay | Admitting: Internal Medicine

## 2009-04-07 LAB — CONVERTED CEMR LAB: Hgb A1c MFr Bld: 9.4 % — ABNORMAL HIGH (ref 4.6–6.1)

## 2009-05-05 ENCOUNTER — Ambulatory Visit: Payer: Self-pay | Admitting: Pulmonary Disease

## 2009-07-16 ENCOUNTER — Ambulatory Visit: Payer: Self-pay | Admitting: Internal Medicine

## 2009-07-21 LAB — CONVERTED CEMR LAB
ALT: 25 units/L (ref 0–53)
AST: 29 units/L (ref 0–37)
BUN: 23 mg/dL (ref 6–23)
Basophils Absolute: 0.1 10*3/uL (ref 0.0–0.1)
CO2: 28 meq/L (ref 19–32)
Chloride: 102 meq/L (ref 96–112)
Creatinine, Ser: 1.1 mg/dL (ref 0.4–1.5)
Eosinophils Absolute: 0.5 10*3/uL (ref 0.0–0.7)
GFR calc non Af Amer: 69.19 mL/min (ref >60)
HDL: 38.4 mg/dL — ABNORMAL LOW (ref >39.00)
Hemoglobin: 15.7 g/dL (ref 13.0–17.0)
Hgb A1c MFr Bld: 8.6 % — ABNORMAL HIGH (ref 4.6–6.5)
Lymphocytes Relative: 29.5 % (ref 12.0–46.0)
MCHC: 34.4 g/dL (ref 30.0–36.0)
Monocytes Relative: 7.3 % (ref 3.0–12.0)
Neutrophils Relative %: 57.9 % (ref 43.0–77.0)
Phosphorus: 3.3 mg/dL (ref 2.3–4.6)
RBC: 5.35 M/uL (ref 4.22–5.81)
RDW: 14.7 % — ABNORMAL HIGH (ref 11.5–14.6)
Sodium: 139 meq/L (ref 135–145)
TSH: 2 microintl units/mL (ref 0.35–5.50)
Total Protein: 8.4 g/dL — ABNORMAL HIGH (ref 6.0–8.3)
Triglycerides: 113 mg/dL (ref 0.0–149.0)
VLDL: 22.6 mg/dL (ref 0.0–40.0)

## 2009-08-25 ENCOUNTER — Ambulatory Visit: Payer: Self-pay | Admitting: Cardiovascular Disease

## 2009-09-15 ENCOUNTER — Ambulatory Visit: Payer: Self-pay | Admitting: Cardiology

## 2009-09-22 ENCOUNTER — Ambulatory Visit: Payer: Self-pay | Admitting: Internal Medicine

## 2009-11-24 ENCOUNTER — Encounter: Payer: Self-pay | Admitting: Internal Medicine

## 2009-12-26 ENCOUNTER — Telehealth (INDEPENDENT_AMBULATORY_CARE_PROVIDER_SITE_OTHER): Payer: Self-pay | Admitting: *Deleted

## 2009-12-31 ENCOUNTER — Encounter: Payer: Self-pay | Admitting: Internal Medicine

## 2009-12-31 ENCOUNTER — Ambulatory Visit: Payer: Self-pay | Admitting: Internal Medicine

## 2010-01-01 LAB — CONVERTED CEMR LAB: Hgb A1c MFr Bld: 7.9 % — ABNORMAL HIGH (ref 4.6–6.5)

## 2010-01-21 ENCOUNTER — Encounter: Payer: Self-pay | Admitting: Cardiovascular Disease

## 2010-03-05 HISTORY — PX: TOTAL KNEE ARTHROPLASTY: SHX125

## 2010-03-23 ENCOUNTER — Encounter: Payer: Self-pay | Admitting: Internal Medicine

## 2010-03-24 ENCOUNTER — Telehealth: Payer: Self-pay | Admitting: Internal Medicine

## 2010-04-02 ENCOUNTER — Inpatient Hospital Stay (HOSPITAL_COMMUNITY)
Admission: RE | Admit: 2010-04-02 | Discharge: 2010-04-05 | Payer: Self-pay | Source: Home / Self Care | Attending: Orthopedic Surgery | Admitting: Orthopedic Surgery

## 2010-04-03 LAB — CONVERTED CEMR LAB: Hgb A1c MFr Bld: 7.6 %

## 2010-04-07 ENCOUNTER — Telehealth: Payer: Self-pay | Admitting: Internal Medicine

## 2010-04-09 ENCOUNTER — Ambulatory Visit
Admission: RE | Admit: 2010-04-09 | Discharge: 2010-04-09 | Payer: Self-pay | Source: Home / Self Care | Attending: Family Medicine | Admitting: Family Medicine

## 2010-04-16 ENCOUNTER — Ambulatory Visit
Admission: RE | Admit: 2010-04-16 | Discharge: 2010-04-16 | Payer: Self-pay | Source: Home / Self Care | Attending: Family Medicine | Admitting: Family Medicine

## 2010-04-16 ENCOUNTER — Other Ambulatory Visit: Payer: Self-pay | Admitting: Family Medicine

## 2010-04-16 LAB — BASIC METABOLIC PANEL
BUN: 29 mg/dL — ABNORMAL HIGH (ref 6–23)
CO2: 30 mEq/L (ref 19–32)
Calcium: 9.8 mg/dL (ref 8.4–10.5)
Chloride: 96 mEq/L (ref 96–112)
Creatinine, Ser: 1.2 mg/dL (ref 0.4–1.5)
GFR: 61.86 mL/min (ref >60.00)
Glucose, Bld: 163 mg/dL — ABNORMAL HIGH (ref 70–99)
Potassium: 4.4 mEq/L (ref 3.5–5.1)
Sodium: 135 mEq/L (ref 135–145)

## 2010-04-20 ENCOUNTER — Telehealth: Payer: Self-pay | Admitting: Internal Medicine

## 2010-04-26 ENCOUNTER — Encounter: Payer: Self-pay | Admitting: Internal Medicine

## 2010-04-27 ENCOUNTER — Encounter: Payer: Self-pay | Admitting: Internal Medicine

## 2010-05-05 NOTE — Letter (Signed)
Summary: [facility] [place] [facility]  [facility]   Imported By: Phillis Knack 09/12/2009 11:23:19  _____________________________________________________________________  External Attachment:    Type:   Image     Comment:   External Document

## 2010-05-05 NOTE — Assessment & Plan Note (Signed)
Summary: Cardiology Office Visit   Referred by:  Dr. Johnsie Cancel PCP:  Donella Stade, MD   History of Present Illness: [name] returns today for followup.  He continues to have some shortness of breath. his heart catheterization was benign.  All his bypass grafts are patent.  His filling pressures were normal.  He continues to have normal LV function.  He had a CT scan which showed an interstitial process involving the lower lobes.  He has a followup appointment with our pulmonary doctors.  Is not any recurrent chest pain.  No PND orthopnea.  He does have lower extremity edema secondary some varicosities.  He's been compliant with his low dose hydrochlorothiazide.  I told Mr. [name] I thought his heart was stable.  I think is her current problems are stemming from some interstitial pulmonary process.      Prior Medication List:  ADULT ASPIRIN LOW STRENGTH 81 MG  TBDP (ASPIRIN) Take 1 tablet by mouth once a day MULTIVITAMINS   TABS (MULTIPLE VITAMIN) Take 1 tablet by mouth once a day ISOSORBIDE MONONITRATE CR 60 MG  TB24 (ISOSORBIDE MONONITRATE) Take 1 tablet by mouth once a day SIMVASTATIN 40 MG  TABS (SIMVASTATIN) Take 1 tablet by mouth once a day PRILOSEC OTC 20 MG  TBEC (OMEPRAZOLE MAGNESIUM) Take 1 tablet by mouth once a day HYDROCODONE-ACETAMINOPHEN 5-325 MG  TABS (HYDROCODONE-ACETAMINOPHEN) every 6 hours prn TEMAZEPAM 15 MG  CAPS (TEMAZEPAM) 1 at bedtime as needed for sleep problems METOPROLOL TARTRATE 50 MG TABS (METOPROLOL TARTRATE) take one half tab by mouth twice a day HYDROCHLOROTHIAZIDE 25 MG TABS (HYDROCHLOROTHIAZIDE) Take 1 tablet by mouth once a day   Updated Prior Medication List: ADULT ASPIRIN LOW STRENGTH 81 MG  TBDP (ASPIRIN) Take 1 tablet by mouth once a day MULTIVITAMINS   TABS (MULTIPLE VITAMIN) Take 1 tablet by mouth once a day ISOSORBIDE MONONITRATE CR 60 MG  TB24 (ISOSORBIDE MONONITRATE) Take 1 tablet by mouth once a day SIMVASTATIN 40 MG  TABS (SIMVASTATIN)  Take 1 tablet by mouth once a day PRILOSEC OTC 20 MG  TBEC (OMEPRAZOLE MAGNESIUM) Take 1 tablet by mouth once a day HYDROCODONE-ACETAMINOPHEN 5-325 MG  TABS (HYDROCODONE-ACETAMINOPHEN) every 6 hours prn TEMAZEPAM 15 MG  CAPS (TEMAZEPAM) 1 at bedtime as needed for sleep problems METOPROLOL TARTRATE 50 MG TABS (METOPROLOL TARTRATE) take one half tab by mouth twice a day HYDROCHLOROTHIAZIDE 25 MG TABS (HYDROCHLOROTHIAZIDE) Take 1 tablet by mouth once a day  Current Allergies: ! CARDURA      Review of Systems  The patient denies anorexia, fever, weight loss, weight gain, vision loss, decreased hearing, hoarseness, chest pain, syncope, dyspnea on exertion, peripheral edema, prolonged cough, headaches, hemoptysis, abdominal pain, melena, hematochezia, severe indigestion/heartburn, hematuria, incontinence, genital sores, muscle weakness, suspicious skin lesions, transient blindness, difficulty walking, depression, unusual weight change, abnormal bleeding, enlarged lymph nodes, angioedema, breast masses, and testicular masses.         No cough, SSCP, palpitation, or syncope   Vital Signs:  Patient Profile:   77 Years Old Male Height:     74 inches Weight:      232 pounds Pulse rate:   64 / minute Resp:     12 per minute BP supine:   130 / 60                  Physical Exam  General:     Well developed, well nourished, in no acute distress. Eyes:     PERRLA/EOM intact; conjunctiva  and lids normal. Mouth:     Teeth, gums and palate normal. Oral mucosa normal. Neck:     Neck supple, no JVD. No masses, thyromegaly or abnormal cervical nodes. Lungs:     Clear bilaterally to auscultation and percussion. Heart:     [id number] with systolic ejection murmur of mild aortic stenosis PMI is normal Abdomen:     Bowel sounds positive; abdomen soft and non-tender without masses, organomegaly, or hernias noted. No hepatosplenomegaly. Msk:     Back normal, normal gait. Muscle strength and tone  normal. Extremities:     No clubbing or cyanosis.mild lower extremity venousvenous varicosities Neurologic:     Alert and oriented x 3. Skin:     Intact without lesions or rashes. Psych:     Normal affect.   Impression & Recommendations:  Problem # 1:  CORONARY ARTERY DISEASE (ICD-414.00) his coronary artery disease is stable.  Recent catheterization shows all patent grafts.  He has normal LV function.  He is normal filling pressures.  Will continue his current medications including an aspirin a day.  No need for functional study in the near future.   Problem # 2:  DYSPNEA ON EXERTION (ICD-786.09) the patient has a followup appointment with pulmonary.  I believe a repeat CT scan is being done next week.  Will check a BMET, BNP and ESR to asses filling pressures and R/O an inflamatory process.further recommendations will be given by pulmonary consultation.   Problem # 3:  AORTIC STENOSIS (ICD-424.1)  patient's murmur continues to be mild.  Follow up echocardiogram in 6 months.  His last echo in January of 08 showed the mean gradient across aortic valve of only 6 mm mercury.  Hopefully his valve disease will not progress as it would be a difficult decision to have a redo valve procedure as an octogenarian.   Problem # 4:  HYPERTENSION (ICD-401.9)  His updated medication list for this problem includes:    Adult Aspirin Low Strength 81 Mg Tbdp (Aspirin) .[name]... Take 1 tablet by mouth once a day    Metoprolol Tartrate 50 Mg Tabs (Metoprolol tartrate) .[name]... Take one half tab by mouth twice a day    Hydrochlorothiazide 25 Mg Tabs (Hydrochlorothiazide) .[name]... Take 1 tablet by mouth once a day  blood pressure is well controlled.  Continue low-sodium diet and above medications.   Problem # 5:  HYPERLIPIDEMIA (P102836.4)  His updated medication list for this problem includes:    Simvastatin 40 Mg Tabs (Simvastatin) .[name]... Take 1 tablet by mouth once a day  have to look in the patient's  chart.  I suspect Dr. [name] is checked his LDL in the last 6 months.  Target goal is less than 80 currently not having any side effects from statin therapy    Patient Instructions: 1)  F/U will pulmonary for lung process 2)  F/U with Cardiology in 6 months with echocardiogram 3)  Lab work today  Bmet, BNP and ESR    ]

## 2010-05-05 NOTE — Assessment & Plan Note (Signed)
Summary: 6 month rov/sl  Medications Added NITROSTAT 0.4 MG SUBL (NITROGLYCERIN) as needed        Visit Type:  6 mo f/u Referring Provider:  [name], MD Primary Provider:  Leticia Penna, MD  CC:  no cardiac  complaints today.  History of Present Illness: [name] is seen today for F/U of CAD with ischemic DCM  EF around 35% in past but most recent echo 12/10  normal.  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  He has had strictures and multiple endoscopy's.  He has had a distant CABG and cath 01/2008 showed widely patent grafts and normal EF.  I think his dyspnea is more related to bronchiectasis and interstitial lung disease as documented by CT.  he has been compliant with his meds  He also has mild AS on recent echo and should probably have another one 03/2011.  Current Medications (verified): 1)  Hydrochlorothiazide 25 Mg Tabs (Hydrochlorothiazide) .... Take 1 Tablet By Mouth Once A Day 2)  Adult Aspirin Low Strength 81 Mg  Tbdp (Aspirin) .... Take 1 Tablet By Mouth Once A Day 3)  Multivitamins   Tabs (Multiple Vitamin) .... Take 1 Tablet By Mouth Once A Day 4)  Isosorbide Mononitrate Cr 60 Mg  Tb24 (Isosorbide Mononitrate) .... Take 1 Tablet By Mouth Once A Day 5)  Simvastatin 80 Mg Tabs (Simvastatin) .... Take One Tablet By Mouth Daily At Bedtime 6)  Prilosec Otc 20 Mg  Tbec (Omeprazole Magnesium) .... Take 1 Tablet By Mouth Once A Day 7)  Metformin Hcl 1000 Mg Tabs (Metformin Hcl) .[name].. 1 Tab Daily Before Breakfast and Supper 8)  Losartan Potassium 50 Mg Tabs (Losartan Potassium) .[name].. 1 Tab Daily For High Blood Pressure  Allergies: 1)  ! Cardura  Vital Signs:  Patient profile:   77 year old male Height:      74 inches Weight:      217 pounds BMI:     27.96 Pulse rate:   88 / minute Pulse rhythm:   regular BP sitting:   142 / 90  (left arm) Cuff size:   large  Vitals Entered By: Julaine Hua, CMA (Aug 25, 2009 9:40  AM)  Physical Exam  General:  Affect appropriate Healthy:  appears stated age 57: normal Neck supple with no adenopathy JVP normal no bruits no thyromegaly Lungs basilar fibrotic crackles with no wheezing and good diaphragmatic motion Heart:  S1/S2 mild AS  murmur,rub, gallop or click PMI normal Abdomen: benighn, BS positve, no tenderness, no AAA no bruit.  No HSM or HJR Distal pulses intact with no bruits No edema Neuro non-focal Skin warm and dry    Impression & Recommendations:  Problem # 1:  CORONARY ARTERY DISEASE (ICD-414.00) Stable grafts patent cath 2009 His updated medication list for this problem includes:    Adult Aspirin Low Strength 81 Mg Tbdp (Aspirin) .[name]... Take 1 tablet by mouth once a day    Isosorbide Mononitrate Cr 60 Mg Tb24 (Isosorbide mononitrate) .[name]... Take 1 tablet by mouth once a day    Nitrostat 0.4 Mg Subl (Nitroglycerin) .[name]... As needed  Problem # 2:  AORTIC STENOSIS (ICD-424.1) Mild with no change in murmur.  F/U echo 2 years His updated medication list for this problem includes:    Hydrochlorothiazide 25 Mg Tabs (Hydrochlorothiazide) .[name]... Take 1 tablet by mouth once a day    Isosorbide Mononitrate Cr 60 Mg Tb24 (Isosorbide mononitrate) .[name]... Take 1  tablet by mouth once a day    Losartan Potassium 50 Mg Tabs (Losartan potassium) .[name]... 1 tab daily for high blood pressure    Nitrostat 0.4 Mg Subl (Nitroglycerin) .[name]... As needed  Problem # 3:  DYSPNEA ON EXERTION (ICD-786.09) F/U pulmonary.  CT in June.  Pulmonary fibrosis and mild OSA His updated medication list for this problem includes:    Hydrochlorothiazide 25 Mg Tabs (Hydrochlorothiazide) .[name]... Take 1 tablet by mouth once a day    Adult Aspirin Low Strength 81 Mg Tbdp (Aspirin) .[name]... Take 1 tablet by mouth once a day    Losartan Potassium 50 Mg Tabs (Losartan potassium) .[name]... 1 tab daily for high blood pressure  Problem # 4:  HYPERTENSION (ICD-401.9) Well controlled His updated  medication list for this problem includes:    Hydrochlorothiazide 25 Mg Tabs (Hydrochlorothiazide) .[name]... Take 1 tablet by mouth once a day    Adult Aspirin Low Strength 81 Mg Tbdp (Aspirin) .[name]... Take 1 tablet by mouth once a day    Losartan Potassium 50 Mg Tabs (Losartan potassium) .[name]... 1 tab daily for high blood pressure  Problem # 5:  HYPERLIPIDEMIA (ICD-272.4) At goal with no side effects His updated medication list for this problem includes:    Simvastatin 80 Mg Tabs (Simvastatin) .[name]... Take one tablet by mouth daily at bedtime  CHOL: 129 (07/16/2009)   LDL: 68 (07/16/2009)   HDL: 38.40 (07/16/2009)   TG: 113.0 (07/16/2009)  Patient Instructions: 1)  Your physician recommends that you schedule a follow-up appointment in: 12 months with Dr Johnsie Cancel 2)  Your physician recommends that you continue on your current medications as directed. Please refer to the Current Medication list given to you today. Prescriptions: NITROSTAT 0.4 MG SUBL (NITROGLYCERIN) as needed  #25 x 11   Entered by:   Sim Boast, RN   Authorized by:   Bosie Clos, MD, [facility]   Signed by:   Sim Boast, RN on 08/25/2009   Method used:   Electronically to        [id number]       [address]       [place], [place]  [address]       Ph: [phone]       Fax: [phone]   RxID:   [id number]

## 2010-05-05 NOTE — Assessment & Plan Note (Signed)
Summary: 35 M F/U DLO   Vital Signs:  Patient profile:   77 year old male Weight:      220 pounds Temp:     98.1 degrees F oral Pulse rate:   80 / minute Pulse rhythm:   irregular BP sitting:   144 / 80  (left arm) Cuff size:   large  Vitals Entered By: Edwin Dada CMA Deborra Medina) (July 16, 2009 10:47 AM) CC: 6 month follow-up   History of Present Illness: Doing okay  Voiding somewhat better but not great Still has nocturia 2-3 Flow may be some better---"freer"  Checks sugars about every 2-3 days generally  ~180 No hypoglycemia  Breathing has been good rare cough Same  chest pain --- better but defies diagnosis still No edema Has noted improving exercise tolerance with his exercise  Mild arthritic pain No regular meds  stomach still hurts No sig heartburn No real change from the winter  Allergies: 1)  ! Cardura  Past History:  Past medical, surgical, family and social histories (including risk factors) reviewed for relevance to current acute and chronic problems.  Past Medical History: #DYSPNEA - since July-Sept 2009 >Coronary artery disease. s/p CABG x 5 in 2002.  > symptoms followed lopressor Jan-July 2009 and lisinopril increase July 2009 >  Myoview on April 17, 2007 was  nonischemic with an EF of 51% and inferobasal wall scar.  > Cath Oct 2009:   1. Severe native three-vessel coronary artery disease.   2. Status post multivessel coronary bypass surgery with all grafts      patent.   3. Normal left ventricular systolic function with normal left    ventricular filling pressures.  > PFT - Mixed pattern on spiro. Mild restn on lung volumes with near normal DLCO.  - Pattern can be explained by CABG scar - Fev1 2.2L/73%, FVC 3.21L/70%, Ratio 68 (67),  TLC 4.7L/68%, RV 1.5L/55%, DLCO 79%.  > CPST 05/06/2008: Normal effot.  Reduced VO2 max 20.5/65%, REduced AT 8.2/40%, Normal breathing reservce of 55%, submaximal heart rate response 112/77%,  flattened O2 pulse  response at peak exercise - 89ml/beat at 85%. No VQ mismatch abnormalities. > ECHO 09/04/2008: LVH, ef 60%, mild AS,  #Hyperlipidemia #Hypertension #Benign prostatic hypertrophy #Diabetes mellitus, type II #GERD - on prilosec as needed #Osteoarthritis #Overweight - BMI 29 #Osteoarthritis - of digits #Interstitial Lung disease NOS  >Stable pulm infilratates 03/2008 -> 10/2008: ? due to GERD #Diverticulosis #Hiatal hernia #Esophageal stricture >s/p dilatation spring 2010  Past Surgical History: Reviewed history from 10/17/2006 and no changes required. Cholecystectomy (jacobs ) -open  11/2005 CABG  11/2000 Adenosine cardiolite-- thinning without ischemia EF- 55%  01/2003 CP--no MI (cath- small vessel desease ) 08/2004 Cardolite neg. 04/2006 R knee arthroscopy--Dr Dawna Part  6/08  Family History: Reviewed history from 04/21/2007 and no changes required. Mom died from COPD in her 61's Dad died from heart disease in his 39's 3 brothers--2 died (1 from DM complications, 1 from colon cancer) 5 sisters--1 died from alcoholsim  Social History: Reviewed history from 01/30/2008 and no changes required. Retired--mows lawns Married Former Smoker--quit in 1960's Alcohol use-no children Worked in Teacher, adult education care  Review of Systems       trying to eat right 4# further weight loss walking and [facility]  Physical Exam  General:  alert and normal appearance.   Neck:  supple, no masses, no thyromegaly, no carotid bruits, and no cervical lymphadenopathy.   Lungs:  normal respiratory  effort, no intercostal retractions, and no accessory muscle use.  Fine bibasilar crackles Heart:  normal rate, regular rhythm, no murmur, and no gallop.   Abdomen:  soft and non-tender.   Pulses:  1+ in feet Extremities:  no edema Skin:  no suspicious lesions and no ulcerations.   Psych:  normally interactive, good eye contact, not anxious appearing, and not depressed appearing.    Diabetes  Management Exam:    Foot Exam (with socks and/or shoes not present):       Sensory-Pinprick/Light touch:          Left medial foot (L-4): normal          Left dorsal foot (L-5): normal          Left lateral foot (S-1): normal          Right medial foot (L-4): normal          Right dorsal foot (L-5): normal          Right lateral foot (S-1): normal       Inspection:          Left foot: normal          Right foot: normal       Nails:          Left foot: normal          Right foot: normal    Eye Exam:       Eye Exam done elsewhere          Date: 11/04/2008          Results: no retinopathy          Done by: Dr Loanne Drilling   Impression & Recommendations:  Problem # 1:  DIABETES MELLITUS, TYPE II (ICD-250.00) Assessment Improved  lifestyle improvements will double metformin now consider 2 nd agent if not <9% by next visit  The following medications were removed from the medication list:    Cozaar 50 Mg Tabs (Losartan potassium) .[name]... Take 1 tablet by mouth once a day    Metformin Hcl 500 Mg Xr24h-tab (Metformin hcl) .[name]... 2 tabs before breakfast daily His updated medication list for this problem includes:    Adult Aspirin Low Strength 81 Mg Tbdp (Aspirin) .[name]... Take 1 tablet by mouth once a day    Metformin Hcl 1000 Mg Tabs (Metformin hcl) .[name]... 1 tab daily before breakfast and supper    Losartan Potassium 50 Mg Tabs (Losartan potassium) .[name]... 1 tab daily for high blood pressure  Labs Reviewed: Creat: 1.0 (02/17/2009)     Last Eye Exam: no retinopathy (11/04/2008) Reviewed HgBA1c results: 9.4 (04/03/2009)  10.5 (02/17/2009)  Orders: TLB-A1C / Hgb A1C (Glycohemoglobin) (83036-A1C)  Problem # 2:  ABDOMINAL PAIN, LEFT LOWER QUADRANT (ICD-789.04) Assessment: Unchanged persists without diagnosis doesn't appear to be worrisome No further testing now  Problem # 3:  GERD (ICD-530.81) Assessment: Unchanged seems to have reasonable control  His updated medication list for this  problem includes:    Prilosec Otc 20 Mg Tbec (Omeprazole magnesium) .[name]... Take 1 tablet by mouth once a day  Problem # 4:  BENIGN PROSTATIC HYPERTROPHY (ICD-600.00) Assessment: Improved slightly better on tamsulosin so will continue  Problem # 5:  HYPERTENSION (ICD-401.9) Assessment: Comment Only reasonable control no changes  The following medications were removed from the medication list:    Cozaar 50 Mg Tabs (Losartan potassium) .[name]... Take 1 tablet by mouth once a day His updated medication list for this problem includes:  Hydrochlorothiazide 25 Mg Tabs (Hydrochlorothiazide) .[name]... Take 1 tablet by mouth once a day    Losartan Potassium 50 Mg Tabs (Losartan potassium) .[name]... 1 tab daily for high blood pressure  Orders: Prescription Created Electronically [id number]) TLB-Renal Function Panel (80069-RENAL) TLB-TSH (Thyroid Stimulating Hormone) (84443-TSH) TLB-CBC Platelet - w/Differential (85025-CBCD) Venipuncture HR:[id number])  BP today: 144/80 Prior BP: 118/80 (05/05/2009)  Prior 10 Yr Risk Heart Disease: N/A (05/05/2009)  Labs Reviewed: K+: 4.0 (02/17/2009) Creat: : 1.0 (02/17/2009)   Chol: 136 (02/04/2009)   HDL: 36.50 (02/04/2009)   LDL: 81 (02/04/2009)   TG: 92.0 (02/04/2009)  Complete Medication List: 1)  Hydrochlorothiazide 25 Mg Tabs (Hydrochlorothiazide) .... Take 1 tablet by mouth once a day 2)  Adult Aspirin Low Strength 81 Mg Tbdp (Aspirin) .... Take 1 tablet by mouth once a day 3)  Multivitamins Tabs (Multiple vitamin) .... Take 1 tablet by mouth once a day 4)  Isosorbide Mononitrate Cr 60 Mg Tb24 (Isosorbide mononitrate) .... Take 1 tablet by mouth once a day 5)  Simvastatin 80 Mg Tabs (Simvastatin) .... Take one tablet by mouth daily at bedtime 6)  Prilosec Otc 20 Mg Tbec (Omeprazole magnesium) .... Take 1 tablet by mouth once a day 7)  Tamsulosin Hcl 0.4 Mg Caps (Tamsulosin hcl) .[name].. 1 tab daily for enlarged prostate 8)  Metformin Hcl 1000 Mg Tabs (Metformin hcl)  .[name].. 1 tab daily before breakfast and supper 9)  Losartan Potassium 50 Mg Tabs (Losartan potassium) .[name].. 1 tab daily for high blood pressure  Other Orders: TLB-Lipid Panel (80061-LIPID) TLB-Hepatic/Liver Function Pnl (80076-HEPATIC)  Patient Instructions: 1)  Please schedule a follow-up appointment in 6 months .  Prescriptions: LOSARTAN POTASSIUM 50 MG TABS (LOSARTAN POTASSIUM) 1 tab daily for high blood pressure  #30 x 11   Entered and Authorized by:   Claris Gower MD   Signed by:   Claris Gower MD on 07/16/2009   Method used:   [facility]        [phone] ([facility]ail)       [address]       [place], [place]  [address]       Ph: [phone]       Fax: [phone]   RxID:   [id number] METFORMIN HCL 1000 MG TABS (METFORMIN HCL) 1 tab daily before breakfast and supper  #60 x 11   Entered and Authorized by:   Claris Gower MD   Signed by:   Claris Gower MD on 07/16/2009   Method used:   Electronically to        [phone]* (retail)       [address]       [place], [place]  [address]       Ph: [phone]       Fax: [phone]   RxID:   [id number]   Current Allergies (reviewed today): ! CARDURA

## 2010-05-05 NOTE — Assessment & Plan Note (Signed)
Summary: PRE-OP CLEARANCE,FLU SHOT/CLE   Vital Signs:  Patient profile:   77 year old male Height:      73 inches Weight:      216 pounds O2 Sat:      95 % on Room air Temp:     98.2 degrees F oral Pulse rate:   70 / minute Pulse rhythm:   regular BP sitting:   140 / 80  (left arm) Cuff size:   large  Vitals Entered By: Edwin Dada CMA Deborra Medina) (December 31, 2009 11:18 AM)  O2 Flow:  Room air CC: Pre-Op Clearance   History of Present Illness: Here for preop evaluation Dr Telford Nab plans right TKR hopes to go right home with PT at home  Sugars have been running up in the 170's No hypoglycemia mixup with metformin--had only been taking 1000mg  daily instead of two times a day--Rx corrected Hard to exercise due to knee but has been walking  Walks at least 1/4- 1/2 mile without stopping Does note dyspnea walking up stairs or a hill but he doesn't have to stop  Still gets chest discomfort with exertion has had full evaluation including cardiopulmonary stress test---no findings  No cough-- or rare  Still has trouble sleeping due to nocturia uses 2 pillows--no change No PND  Allergies: 1)  ! Cardura  Past History:  Past medical, surgical, family and social histories (including risk factors) reviewed for relevance to current acute and chronic problems.  Past Medical History: Reviewed history from 09/22/2009 and no changes required. #DYSPNEA - since July-Sept 2009 >Coronary artery disease. s/p CABG x 5 in 2002.  > symptoms followed lopressor Jan-July 2009 and lisinopril increase July 2009 >  Myoview on April 17, 2007 was  nonischemic with an EF of 51% and inferobasal wall scar.  > Cath Oct 2009:   1. Severe native three-vessel coronary artery disease.   2. Status post multivessel coronary bypass surgery with all grafts      patent.   3. Normal left ventricular systolic function with normal left    ventricular filling pressures.  > PFT - Mixed pattern on spiro.  Mild restn on lung volumes with near normal DLCO.  - Pattern can be explained by CABG scar - Fev1 2.2L/73%, FVC 3.21L/70%, Ratio 68 (67),  TLC 4.7L/68%, RV 1.5L/55%, DLCO 79%.  > CPST 05/06/2008: Normal effot.  Reduced VO2 max 20.5/65%, REduced AT 8.2/40%, Normal breathing reservce of 55%, submaximal heart rate response 112/77%,  flattened O2 pulse response at peak exercise - 48ml/beat at 85%. No VQ mismatch abnormalities. All c/w CIRC LIMITATION > ECHO 09/04/2008: LVH, ef 60%, mild AS,  #Hyperlipidemia #Hypertension #Benign prostatic hypertrophy #Diabetes mellitus, type II #GERD - on prilosec as needed #Osteoarthritis #Overweight - BMI 29 #Osteoarthritis - of digits #Interstitial Lung disease NOS  >Stable pulm infilratates 03/2008 -> 10/2008: ? due to GERD #Diverticulosis #Hiatal hernia #Esophageal stricture >s/p dilatation spring 2010  Past Surgical History: Reviewed history from 10/17/2006 and no changes required. Cholecystectomy (jacobs ) -open  11/2005 CABG  11/2000 Adenosine cardiolite-- thinning without ischemia EF- 55%  01/2003 CP--no MI (cath- small vessel desease ) 08/2004 Cardolite neg. 04/2006 R knee arthroscopy--Dr Dawna Part  6/08  Family History: Reviewed history from 04/21/2007 and no changes required. Mom died from COPD in her 4's Dad died from heart disease in his 59's 3 brothers--2 died (1 from DM complications, 1 from colon cancer) 5 sisters--1 died from alcoholsim  Social History: Reviewed history from 01/30/2008 and no changes required.  Retired--mows lawns Married Former Smoker--quit in 1960's Alcohol use-no children Worked in lawn care  Review of Systems  The patient denies syncope and abdominal pain.         weight stable  Bowels fine  Physical Exam  General:  alert and normal appearance.   Neck:  supple, no masses, no thyromegaly, no carotid bruits, and no cervical lymphadenopathy.   Lungs:  normal respiratory effort, no intercostal retractions,  and no accessory muscle use.   Bibasilar coarse crackles Heart:  normal rate, regular rhythm, no murmur, and no gallop.   Pulses:  faint in feet Extremities:  no edema Skin:  no suspicious lesions and no ulcerations.   Psych:  normally interactive, good eye contact, not anxious appearing, and not depressed appearing.    Diabetes Management Exam:    Foot Exam (with socks and/or shoes not present):       Sensory-Pinprick/Light touch:          Left medial foot (L-4): normal          Left dorsal foot (L-5): normal          Left lateral foot (S-1): normal          Right medial foot (L-4): normal          Right dorsal foot (L-5): normal          Right lateral foot (S-1): normal       Inspection:          Left foot: normal          Right foot: normal       Nails:          Left foot: thickened          Right foot: thickened   Impression & Recommendations:  Problem # 1:  OSTEOARTHRITIS (ICD-715.90) Assessment Comment Only due for right TKR by Dr Telford Nab No medical contraindications has had ongoing chest discomfort without evidence of active CAD or CHF stalble NYHA class 2 activity level Diabetes slightly out of control due to mistake with meds----no reason to postpone sugar. Can use brief course of insulin in hospital if running too high  His updated medication list for this problem includes:    Adult Aspirin Low Strength 81 Mg Tbdp (Aspirin) .[name]... Take 1 tablet by mouth once a day  Problem # 2:  DIABETES MELLITUS, TYPE II (ICD-250.00) Assessment: Comment Only  may be higher due to error with metformin will recheck in 2-3 months if over 9% again  The following medications were removed from the medication list:    Metformin Hcl 500 Mg Tabs (Metformin hcl) .[name]... 2 before first meal His updated medication list for this problem includes:    Metformin Hcl 1000 Mg Tabs (Metformin hcl) .[name]... 1 two times a day before breakfast and supper    Losartan Potassium 50 Mg Tabs (Losartan  potassium) .[name]... 1 tab daily for high blood pressure    Adult Aspirin Low Strength 81 Mg Tbdp (Aspirin) .[name]... Take 1 tablet by mouth once a day  Labs Reviewed: Creat: 1.1 (07/16/2009)     Last Eye Exam: No diabetic retinopathy.    (11/24/2009) Reviewed HgBA1c results: 8.6 (07/16/2009)  9.4 (04/03/2009)  Orders: Venipuncture [id number]) TLB-A1C / Hgb A1C (Glycohemoglobin) (83036-A1C)  Problem # 3:  CORONARY ARTERY DISEASE (ICD-414.00) Assessment: Unchanged  doesn't appear to be active  His updated medication list for this problem includes:    Hydrochlorothiazide 25 Mg Tabs (Hydrochlorothiazide) .[name]... Take 1 tablet  by mouth once a day    Isosorbide Mononitrate Cr 60 Mg Tb24 (Isosorbide mononitrate) .[name]... Take 1 tablet by mouth once a day    Losartan Potassium 50 Mg Tabs (Losartan potassium) .[name]... 1 tab daily for high blood pressure    Nitrostat 0.4 Mg Subl (Nitroglycerin) .[name]... As needed    Adult Aspirin Low Strength 81 Mg Tbdp (Aspirin) .[name]... Take 1 tablet by mouth once a day  Orders: EKG w/ Interpretation (93000)  Problem # 4:  HYPERTENSION (ICD-401.9) Assessment: Unchanged good control  His updated medication list for this problem includes:    Hydrochlorothiazide 25 Mg Tabs (Hydrochlorothiazide) .[name]... Take 1 tablet by mouth once a day    Losartan Potassium 50 Mg Tabs (Losartan potassium) .[name]... 1 tab daily for high blood pressure  BP today: 140/80 Prior BP: 138/70 (09/22/2009)  Prior 10 Yr Risk Heart Disease: N/A (05/05/2009)  Labs Reviewed: K+: 3.8 (07/16/2009) Creat: : 1.1 (07/16/2009)   Chol: 129 (07/16/2009)   HDL: 38.40 (07/16/2009)   LDL: 68 (07/16/2009)   TG: 113.0 (07/16/2009)  Complete Medication List: 1)  Metformin Hcl 1000 Mg Tabs (Metformin hcl) .[name].. 1 two times a day before breakfast and supper 2)  Hydrochlorothiazide 25 Mg Tabs (Hydrochlorothiazide) .... Take 1 tablet by mouth once a day 3)  Isosorbide Mononitrate Cr 60 Mg Tb24 (Isosorbide mononitrate) ....  Take 1 tablet by mouth once a day 4)  Simvastatin 80 Mg Tabs (Simvastatin) .... Take one tablet by mouth daily at bedtime 5)  Losartan Potassium 50 Mg Tabs (Losartan potassium) .[name].. 1 tab daily for high blood pressure 6)  Nitrostat 0.4 Mg Subl (Nitroglycerin) .... As needed 7)  Adult Aspirin Low Strength 81 Mg Tbdp (Aspirin) .... Take 1 tablet by mouth once a day 8)  Multivitamins Tabs (Multiple vitamin) .... Take 1 tablet by mouth once a day 9)  Prilosec Otc 20 Mg Tbec (Omeprazole magnesium) .... Take 1 tablet by mouth once a day  Patient Instructions: 1)  Please schedule a follow-up appointment in 6 months --cancel next month's appt Prescriptions: METFORMIN HCL 1000 MG TABS (METFORMIN HCL) 1 two times a day before breakfast and supper  #60 x 12   Entered and Authorized by:   Claris Gower MD   Signed by:   Claris Gower MD on 12/31/2009   Method used:   Electronically to        [facility] [phone]* (retail)       [address]       [place], [place]  [address]       Ph: [phone]       Fax: [phone]   RxID:   [id number]   Current Allergies (reviewed today): ! CARDURA  Appended Document: PRE-OP CLEARANCE,FLU SHOT/CLE    Influenza Vaccine    Vaccine Type: Fluvax MCR    Site: left deltoid    Mfr: GlaxoSmithKline    Dose: 0.5 ml    Route: IM    Given by: Emelia Salisbury LPN    Exp. Date: 10/03/2010    Lot #: [id number]    VIS given: 10/28/09 version given December 31, 2009.  Flu Vaccine Consent Questions    Do you have a history of severe allergic reactions to this vaccine? no    Any prior history of allergic reactions to egg and/or gelatin? no    Do you have a sensitivity to the preservative Thimersol? no  Do you have a past history of Guillan-Barre Syndrome? no    Do you currently have an acute febrile illness? no    Have you ever had a severe reaction to latex? no    Vaccine information given and explained to patient?  yes

## 2010-05-05 NOTE — Assessment & Plan Note (Signed)
Summary: 10 months/after ct scan/apc   Visit Type:  Follow-up Copy to:  [name], MD Primary Provider/Referring Provider:  Leticia Penna, MD - PMD, DR Johnsie Cancel - Cards, Dr Halford Chessman - sleep, Dr. Chase Caller -Pulmonary  CC:  Followup.  Pt states that his cough has resolved.  Pt states that breathing is about 50% improved since last.  He states that he is able to do more activity without getting out of breath.  He states that he still gets SOB with heavy exertion.[name]  History of Present Illness: OV 09/22/2009: Folowup dyspnea due to ciriculatory limitation in remote smoker who quit 1960 and s/p CABG x 5 in 2002 and Known Mild Aortic Stenosis. Also with RLL >> LLL infiltrates in CT chest since October 2009. Last viis was August 2010. Since then dyspnea has improved spontaneously. He did not enrol in rehab due to cost issues but has been exxerciseing gently at homme and taking better care of his blood sugars. He is able to mow yard and climb > 1 flight of stairs without dyspnea. He had CT chest 61/06/2009 and this shows continued stability of infiltrates. There are no new complaints. Denies cough.   Preventive Screening-Counseling & Management  Alcohol-Tobacco     Smoking Status: quit  Current Medications (verified): 1)  Hydrochlorothiazide 25 Mg Tabs (Hydrochlorothiazide) .... Take 1 Tablet By Mouth Once A Day 2)  Adult Aspirin Low Strength 81 Mg  Tbdp (Aspirin) .... Take 1 Tablet By Mouth Once A Day 3)  Multivitamins   Tabs (Multiple Vitamin) .... Take 1 Tablet By Mouth Once A Day 4)  Isosorbide Mononitrate Cr 60 Mg  Tb24 (Isosorbide Mononitrate) .... Take 1 Tablet By Mouth Once A Day 5)  Simvastatin 80 Mg Tabs (Simvastatin) .... Take One Tablet By Mouth Daily At Bedtime 6)  Prilosec Otc 20 Mg  Tbec (Omeprazole Magnesium) .... Take 1 Tablet By Mouth Once A Day 7)  Metformin Hcl 1000 Mg Tabs (Metformin Hcl) .[name].. 1 Two Times A Day 8)  Losartan Potassium 50 Mg Tabs (Losartan Potassium) .[name].. 1 Tab Daily  For High Blood Pressure 9)  Nitrostat 0.4 Mg Subl (Nitroglycerin) .... As Needed 10)  Metformin Hcl 500 Mg Tabs (Metformin Hcl) .... 2 Before First Meal  Allergies (verified): 1)  ! Cardura  Past History:  Family History: Last updated: 04-24-07 Mom died from COPD in her 76's Dad died from heart disease in his 34's 3 brothers--2 died (1 from DM complications, 1 from colon cancer) 5 sisters--1 died from alcoholsim  Social History: Last updated: 01/30/2008 Retired--mows lawns Married Former Smoker--quit in 1960's Alcohol use-no children Worked in Teacher, adult education care  Risk Factors: Smoking Status: quit (09/22/2009)  Past Medical History: #DYSPNEA - since July-Sept 2009 >Coronary artery disease. s/p CABG x 5 in 2002.  > symptoms followed lopressor Jan-July 2009 and lisinopril increase July 2009 >  Myoview on April 17, 2007 was  nonischemic with an EF of 51% and inferobasal wall scar.  > Cath Oct 2009:   1. Severe native three-vessel coronary artery disease.   2. Status post multivessel coronary bypass surgery with all grafts      patent.   3. Normal left ventricular systolic function with normal left    ventricular filling pressures.  > PFT - Mixed pattern on spiro. Mild restn on lung volumes with near normal DLCO.  - Pattern can be explained by CABG scar - Fev1 2.2L/73%, FVC 3.21L/70%, Ratio 68 (67),  TLC 4.7L/68%, RV 1.5L/55%, DLCO 79%.  > CPST  05/06/2008: Normal effot.  Reduced VO2 max 20.5/65%, REduced AT 8.2/40%, Normal breathing reservce of 55%, submaximal heart rate response 112/77%,  flattened O2 pulse response at peak exercise - 72ml/beat at 85%. No VQ mismatch abnormalities. All c/w CIRC LIMITATION > ECHO 09/04/2008: LVH, ef 60%, mild AS,  #Hyperlipidemia #Hypertension #Benign prostatic hypertrophy #Diabetes mellitus, type II #GERD - on prilosec as needed #Osteoarthritis #Overweight - BMI 29 #Osteoarthritis - of digits #Interstitial Lung disease NOS  >Stable pulm  infilratates 03/2008 -> 10/2008: ? due to GERD #Diverticulosis #Hiatal hernia #Esophageal stricture >s/p dilatation spring 2010  Past Surgical History: Reviewed history from 10/17/2006 and no changes required. Cholecystectomy (jacobs ) -open  11/2005 CABG  11/2000 Adenosine cardiolite-- thinning without ischemia EF- 55%  01/2003 CP--no MI (cath- small vessel desease ) 08/2004 Cardolite neg. 04/2006 R knee arthroscopy--Dr Dawna Part  6/08  Family History: Reviewed history from 04/21/2007 and no changes required. Mom died from COPD in her 74's Dad died from heart disease in his 54's 3 brothers--2 died (1 from DM complications, 1 from colon cancer) 5 sisters--1 died from alcoholsim  Social History: Reviewed history from 01/30/2008 and no changes required. Retired--mows lawns Married Former Smoker--quit in 1960's Alcohol use-no children Worked in lawn care  Review of Systems       The patient complains of dyspnea on exertion.  The patient denies anorexia, fever, weight loss, weight gain, vision loss, decreased hearing, hoarseness, chest pain, peripheral edema, prolonged cough, headaches, hemoptysis, abdominal pain, melena, hematochezia, severe indigestion/heartburn, hematuria, incontinence, genital sores, muscle weakness, suspicious skin lesions, transient blindness, difficulty walking, depression, unusual weight change, abnormal bleeding, enlarged lymph nodes, angioedema, breast masses, and testicular masses.    Vital Signs:  Patient profile:   77 year old male Weight:      218 pounds BMI:     28.09 O2 Sat:      95 % on Room air Temp:     97.2 degrees F oral Pulse rate:   80 / minute BP sitting:   138 / 70  (left arm)  Vitals Entered By: Tilden Dome (September 22, 2009 8:55 AM)  O2 Flow:  Room air CC: Followup.  Pt states that his cough has resolved.  Pt states that breathing is about 50% improved since last.  He states that he is able to do more activity without getting out of  breath.  He states that he still gets SOB with heavy exertion.   Physical Exam  General:  normal appearance and obese.   Head:  Normocephalic and atraumatic. Eyes:  PERRLA, no icterus. Nose:  Narrow nasal angles Mouth:  MP3, 2+ tonsills, poor dentition, retrognathic Neck:  no JVD, no thyromegaly Chest Wall:  Symmetrical,  no deformities . Lungs:  clear bilaterally to auscultation and percussion Heart:  regular rhythm and normal rate.  Grade 2 murmur in aortic area + Abdomen:  Soft, nontender and nondistended. Msk:  Symmetrical with no gross deformities. Normal posture. Pulses:  Normal pulses noted. Extremities:  No clubbing, cyanosis, edema or deformities noted. Neurologic:  CN II-XII grossly intact with normal reflexes, coordination, muscle strength and tone Skin:  Intact without significant lesions or rashes. Cervical Nodes:  No significant cervical adenopathy.no supraclavicular adenopathy Axillary Nodes:  no significant adenopathy Psych:  Alert and cooperative. Normal mood but mildly flat affect.   CT of Chest  Procedure date:  09/15/2009  Findings:      Comparison: 09/10/2008   Findings: No enlarged axillary or supraclavicular lymph nodes.  No enlarged mediastinal or hilar lymph nodes.   There is no pericardial effusion.   Pleural thickening is again noted within the posterior right lung base and appears unchanged from previous exam.   The trachea appears midline and patent. There is mild bronchiectasis identified bilaterally.  Mild scar-like and ground- glass densities are noted within the right lung base and are unchanged from prior exam.   There is no airspace consolidation identified.   Review of the visualized osseous structures shows no aggressive bone lesions.   IMPRESSION:   1.  Stable CT of the chest. 2.  No change in the appearance of the bilateral bronchiectasis, and right lower lobe ground-glass and scar like densities.   Read By:  Angelita Ingles,  M.D.     Released By:  Angelita Ingles,  M.D.  _____________________________________________________________________  External Attachment:    Type:     Image     Comment:  CT CHEST W/O CM - [id number]   Comments:      personally reviewed. No change actually since OCt 2009  Impression & Recommendations:  Problem # 1:  OBSTRUCTIVE SLEEP APNEA (ICD-327.23) Assessment Comment Only seen by Dr Halford Chessman. No further followup. WEight loss advised  Problem # 2:  UNSPEC ALVEOLAR&PARIETOALVEOLAR PNEUMONOPATHY (ICD-516.9) Assessment: Unchanged  ct 01/2008 -> 03/2008 is better -> 09/2008 is stable -> 09/2009 is stable  plan has completed nearly 2year followup. Will just monitor clinically No further ct Expectant approach Risk for future lung cancer remains due to age and prior smoking hx but current infiltrates are not c/w cancer Orders: Est. Patient Level III [id number])  Problem # 3:  DYSPNEA ON EXERTION (ICD-786.09) Assessment: Improved  Dyspnea is due to ciriculatory limitaiton based on CT. This is due to CAD, CABG and mild AS. THis is better with exercise at home  plan encourage continued daily exerccise advised to be more regular at gym get heart rate monitor and talk to Dr. Johnsie Cancel how much to push himself no further pulmonary followup His updated medication list for this problem includes:    Hydrochlorothiazide 25 Mg Tabs (Hydrochlorothiazide) .[name]... Take 1 tablet by mouth once a day  Orders: Est. Patient Level III [id number])  Medications Added to Medication List This Visit: 1)  Metformin Hcl 1000 Mg Tabs (Metformin hcl) .[name].. 1 two times a day 2)  Metformin Hcl 500 Mg Tabs (Metformin hcl) .... 2 before first meal  Patient Instructions: 1)  I am glad your shortness of breath is better 2)  Continue daily exercises 3)  BUy heart rate monitor and use that as a guide for exercise 4)  Your CT scan has been stable for 2 years 5)  No need for active followup 6)  Return if  having new problems or anything goes bad 7)  Good luck

## 2010-05-05 NOTE — Letter (Signed)
Summary: [facility]   Imported By: Edmonia James 12/01/2009 08:12:08  _____________________________________________________________________  External Attachment:    Type:   Image     Comment:   External Document  Appended Document: [facility]     Clinical Lists Changes  Observations: Added new observation of DIAB EYE EX: No diabetic retinopathy.    (11/24/2009 7:47)       Diabetic Eye Exam  Procedure date:  11/24/2009  Findings:      No diabetic retinopathy.

## 2010-05-05 NOTE — Progress Notes (Signed)
   Walk in Patient Form Recieved " Pt needs form filled out left paper from Sports Medicine" sent to [facility]  December 26, 2009 1:53 PM

## 2010-05-05 NOTE — Assessment & Plan Note (Signed)
Summary: 4-6 mo f/u/LC   Copy to:  [name], MD Primary Provider/Referring Provider:  Leticia Penna, MD  CC:  Sleep follow-up.  The patient has no complaints today.[name]  History of Present Illness: 77 yo male with mild OSA.  He has been sleeping better.  He was recently started on a medicine for his prostate.    He goes to bed at 1030 pm, and falls asleep quickly.  He wakes up once or twice per night to use the bathroom.  He wakes up at 730 am, and feels okay.  He denies morning headache.  He does not have any trouble with feeling sleepy during the day.  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  He has lost about 8 lbs since his last visit.  He has been more aware of his diet, and trying to exercise more.   Hypertension History:      Positive major cardiovascular risk factors include male age 23 years old or older, diabetes, hyperlipidemia, and hypertension.  Negative major cardiovascular risk factors include non-tobacco-user status.        Positive history for target organ damage include ASHD (either angina/prior MI/prior CABG).     Current Medications (verified): 1)  Hydrochlorothiazide 25 Mg Tabs (Hydrochlorothiazide) .... Take 1 Tablet By Mouth Once A Day 2)  Adult Aspirin Low Strength 81 Mg  Tbdp (Aspirin) .... Take 1 Tablet By Mouth Once A Day 3)  Multivitamins   Tabs (Multiple Vitamin) .... Take 1 Tablet By Mouth Once A Day 4)  Isosorbide Mononitrate Cr 60 Mg  Tb24 (Isosorbide Mononitrate) .... Take 1 Tablet By Mouth Once A Day 5)  Simvastatin 80 Mg Tabs (Simvastatin) .... Take One Tablet By Mouth Daily At Bedtime 6)  Prilosec Otc 20 Mg  Tbec (Omeprazole Magnesium) .... Take 1 Tablet By Mouth Once A Day 7)  Cozaar 50 Mg Tabs (Losartan Potassium) .... Take 1 Tablet By Mouth Once A Day 8)  Metformin Hcl 500 Mg Xr24h-Tab (Metformin Hcl) .... 2 Tabs Before Breakfast  Daily 9)  Tamsulosin Hcl 0.4 Mg Caps (Tamsulosin Hcl) .[name].. 1 Tab Daily For Enlarged Prostate  Allergies (verified): 1)  ! Cardura  Past History:  Past Medical History: Reviewed history from 04/03/2009 and no changes required. #DYSPNEA - since July-Sept 2009 >Coronary artery disease. s/p CABG x 5 in 2002.  > symptoms followed lopressor Jan-July 2009 and lisinopril increase July 2009 >  Myoview on April 17, 2007 was  nonischemic with an EF of 51% and inferobasal wall scar.  > Cath Oct 2009:   1. Severe native three-vessel coronary artery disease.   2. Status post multivessel coronary bypass surgery with all grafts      patent.   3. Normal left ventricular systolic function with normal left    ventricular filling pressures.  > PFT - Mixed pattern on spiro. Mild restn on lung volumes with near normal DLCO.  - Pattern can be explained by CABG scar - Fev1 2.2L/73%, FVC 3.21L/70%, Ratio 68 (67),  TLC 4.7L/68%, RV 1.5L/55%, DLCO 79%.  > CPST 05/06/2008: Normal effot.  Reduced VO2 max 20.5/65%, REduced AT 8.2/40%, Normal breathing reservce of 55%, submaximal heart rate response 112/77%,  flattened O2 pulse response at peak exercise - 78ml/beat at 85%. No VQ mismatch abnormalities. > ECHO 09/04/2008: LVH, ef 60%, mild AS,  #Hyperlipidemia #Hypertension #Benign prostatic hypertrophy #Diabetes mellitus, type II #GERD -  on prilosec as needed #Osteoarthritis #Overweight - BMI 29 #Osteoarthritis - of digits #Interstitial Lung disease NOS  >Stable pulm infilratates 03/2008 -> 10/2008: ? due to gerd #Diverticulosis #Hiatal hernia #esophageal stricture >s/p dilatation spring 2010  Past Surgical History: Reviewed history from 10/17/2006 and no changes required. Cholecystectomy (jacobs ) -open  11/2005 CABG  11/2000 Adenosine cardiolite-- thinning without ischemia EF- 55%  01/2003 CP--no MI (cath- small vessel desease ) 08/2004 Cardolite neg. 04/2006 R knee arthroscopy--Dr Dawna Part  6/08  Vital  Signs:  Patient profile:   77 year old male Height:      74 inches (187.96 cm) Weight:      224.50 pounds (102.05 kg) BMI:     28.93 O2 Sat:      96 % on Room air Temp:     97.7 degrees F (36.50 degrees C) oral Pulse rate:   74 / minute BP sitting:   118 / 80  (left arm) Cuff size:   regular  Vitals Entered By: Francesca Jewett CMA (May 05, 2009 9:52 AM)  O2 Sat at Rest %:  96 O2 Flow:  Room air  Physical Exam  General:  normal appearance and obese.   Nose:  Narrow nasal angles Mouth:  MP3, 2+ tonsills, poor dentition, retrognathic Neck:  no JVD, no thyromegaly Lungs:  clear bilaterally to auscultation and percussion Heart:  Regular rate and rhythm; no murmurs, rubs,  or bruits. Abdomen:  Soft, nontender and nondistended. Extremities:  No clubbing, cyanosis, edema or deformities noted. Cervical Nodes:  No significant cervical adenopathy.no supraclavicular adenopathy   Impression & Recommendations:  Problem # 1:  OBSTRUCTIVE SLEEP APNEA (ICD-327.23)  He has mild sleep apnea, and has likely improved with weight loss.  He is sleeping well, and denies daytime sleepiness.  I have encouraged him to continue with his weight loss regimen.  I don't think he needs any additional interventions for his sleep apnea at present.  Advised that he can follow up with sleep medicine if he notices a change in his sleep pattern, or daytime alertness.  Orders: Est. Patient Level II [id number])  Complete Medication List: 1)  Hydrochlorothiazide 25 Mg Tabs (Hydrochlorothiazide) .... Take 1 tablet by mouth once a day 2)  Adult Aspirin Low Strength 81 Mg Tbdp (Aspirin) .... Take 1 tablet by mouth once a day 3)  Multivitamins Tabs (Multiple vitamin) .... Take 1 tablet by mouth once a day 4)  Isosorbide Mononitrate Cr 60 Mg Tb24 (Isosorbide mononitrate) .... Take 1 tablet by mouth once a day 5)  Simvastatin 80 Mg Tabs (Simvastatin) .... Take one tablet by mouth daily at bedtime 6)  Prilosec Otc 20 Mg  Tbec (Omeprazole magnesium) .... Take 1 tablet by mouth once a day 7)  Cozaar 50 Mg Tabs (Losartan potassium) .... Take 1 tablet by mouth once a day 8)  Metformin Hcl 500 Mg Xr24h-tab (Metformin hcl) .... 2 tabs before breakfast daily 9)  Tamsulosin Hcl 0.4 Mg Caps (Tamsulosin hcl) .[name].. 1 tab daily for enlarged prostate  Hypertension Assessment/Plan:      The patient's hypertensive risk group is category C: Target organ damage and/or diabetes.  Today's blood pressure is 118/80.    Patient Instructions: 1)  Follow up as needed

## 2010-05-05 NOTE — Letter (Signed)
Summary: [facility] Pre [facility] Clearance   Imported By: Sallee Provencal 02/04/2010 11:59:28  _____________________________________________________________________  External Attachment:    Type:   Image     Comment:   External Document

## 2010-05-06 ENCOUNTER — Encounter: Payer: Self-pay | Admitting: Internal Medicine

## 2010-05-06 ENCOUNTER — Ambulatory Visit: Admit: 2010-05-06 | Payer: Self-pay | Admitting: Internal Medicine

## 2010-05-06 ENCOUNTER — Ambulatory Visit (INDEPENDENT_AMBULATORY_CARE_PROVIDER_SITE_OTHER): Payer: MEDICARE | Admitting: Internal Medicine

## 2010-05-06 DIAGNOSIS — I10 Essential (primary) hypertension: Secondary | ICD-10-CM

## 2010-05-06 DIAGNOSIS — E1159 Type 2 diabetes mellitus with other circulatory complications: Secondary | ICD-10-CM

## 2010-05-06 DIAGNOSIS — M130 Polyarthritis, unspecified: Secondary | ICD-10-CM | POA: Insufficient documentation

## 2010-05-07 NOTE — Progress Notes (Signed)
Summary: Patient status  Phone Note Other Incoming   Caller: [place] RN - Juliann Pulse Summary of Call: From 04/06/10 visit: S/p TKR on 12/29. BP 180/98. CBG 263 I suggested him  to take Losartan 50 mg two times a day; Metformin 1000 mg 1 tab in am and 1/2 at lunch, 1/2 at dinner. F/u w/Dr Silvio Pate. Call if you are not better in a reasonable amount of time or if worse. Go to ER if feeling really bad!  Initial call taken by: Cassandria Anger MD,  April 07, 2010 8:40 AM  Follow-up for Phone Call        Please check and see how he is doing Claris Gower MD  April 08, 2010 9:31 PM   pt was seen today 04/09/10 by Dr.G, no answer at home number. DeShannon Smith CMA Deborra Medina)  April 09, 2010 4:39 PM   noted  Spoke with patient he states he's doing ok, bp and sugar is good, bp med was changed, coming back 04/16/10 for lab work. DeShannon Smith [place] Deborra Medina)  April 09, 2010 4:45 PM  Follow-up by: Claris Gower MD,  April 10, 2010 5:12 PM

## 2010-05-07 NOTE — Progress Notes (Signed)
Summary: blood in his urine   Phone Note Call from Patient   Caller: [place] Call For: Claris Gower MD Summary of Call: Patient is scheduled for total knee replacement on 04-02-10. Dr. Norville Haggard office faxed over his labs results.They are concerned that there is blood in his urine. They want to know if he needs to be seen before his surgery. Please advise. Labs are on your desk.  Initial call taken by: Lacretia Nicks,  March 24, 2010 10:52 AM  Follow-up for Phone Call        no evidence of infection and only slight microscopic hematuria Okay to proceed with the surgery Follow-up by: Claris Gower MD,  March 24, 2010 1:52 PM  Additional Follow-up for Phone Call Additional follow up Details #1::        faxed back to [facility] and scanned Additional Follow-up by: Edwin Dada CMA Deborra Medina),  March 24, 2010 2:43 PM

## 2010-05-07 NOTE — Progress Notes (Signed)
Summary: pt has rash  Phone Note Refill Request Call back at Home Phone [phone] Message from:  Patient  Pt has started 3 new medicines in the last 2 weeks and this morning noticed a rash over his body.  He only started the skelaxin yesterday, he thinks that is the one that is causing the rash.  Please advise.  Initial call taken by: Marty Heck CMA, AAMA,  April 20, 2010 1:15 PM  Follow-up for Phone Call        what are the other 2 new meds? He should stop the skelaxin, but even if that is causing the rash if may take a few days for the rash to fade If it is another of the meds, the rash may worsen or persist longer Is he having any other allergy type symptoms---mouth swelling, hives, itching, etc? Claris Gower MD  April 20, 2010 1:38 PM   Additional Follow-up for Phone Call Additional follow up Details #1::        I spoke with the patient about another matter. He said he had stopped the skelaxin on Saturday. I advised it may take a few days to fade. I also told him that it may get worse if it's one of the other meds. He denied any mouth/tongue swelling, hives, itching, wheezing or shortness of breath.   Also, Dr. Danise Mina had seen him last week and suggested he follow up with you sooner than Feb 1 for his B/P and sugar. His B/P was running high last week per my note. Today it was 145/78 per the patient's digital machine. I looked at your schedule and you're booked until then with the exception of a few "same day" appts and "per dr" slots. Could you advise if you want him in any sooner? Thanks. Additional Follow-up by: Arnetha Courser CMA Deborra Medina),  April 20, 2010 2:36 PM    Additional Follow-up for Phone Call Additional follow up Details #2::    Okay to wait till Feb 1st unless he wants to push up the appt sooner Claris Gower MD  April 20, 2010 4:12 PM   left message on machine at home for patient to return my call.  DeShannon Smith [place] Deborra Medina)  April 20, 2010  5:06 PM   Pt says he is fine with waiting until 05/06/10 for his appointment. Follow-up by: Marty Heck CMA, AAMA,  April 22, 2010 9:42 AM

## 2010-05-07 NOTE — Assessment & Plan Note (Signed)
Summary: F/U [facility]/CLE   Vital Signs:  Patient profile:   77 year old male Weight:      220.25 pounds Temp:     98.3 degrees F oral Pulse rate:   88 / minute Pulse rhythm:   regular BP sitting:   136 / 84  (left arm) Cuff size:   large  Vitals Entered By: Maudie Mercury Dance CMA (AAMA) (April 09, 2010 11:14 AM) CC: Hospital Follow up Comments B/P and sugar got elevated in the hospital and he was told to follow up for possible medication adjustments. Spoke with Dr. Alain [name] on 04-06-10 who made temporary adjustments to Metformin and Losartan.   History of Present Illness: CC: hosp f/u  s/p R TKR last week, doing well on that aspect.  pain well controlled for most part.  Here today because during hospital blood pressure and sugar went up.  SBP up and cbgs up in hospital, told to f/u with PCP.    At home [facility] checked BP 180/93 and sugar 263 fasting.  CBGs normally run 160-170s fasting.  cbg yesterday 213 fasting.  Today BP 136/84.  has been eating more carbs recently (potatoes that family makes for him while recuperating)  No dizziness, no HA, chest pain, SOB.  compliant with other meds.  taking rivaroxaban x 2 wks then back onto aspirin  Current Medications (verified): 1)  Metformin Hcl 1000 Mg Tabs (Metformin Hcl) .[name].. 1 Two Times A Day Before Breakfast and Supper 2)  Hydrochlorothiazide 25 Mg Tabs (Hydrochlorothiazide) .... Take 1 Tablet By Mouth Once A Day 3)  Isosorbide Mononitrate Cr 60 Mg  Tb24 (Isosorbide Mononitrate) .... Take 1 Tablet By Mouth Once A Day 4)  Simvastatin 80 Mg Tabs (Simvastatin) .... Take One Tablet By Mouth Daily At Bedtime 5)  Losartan Potassium 50 Mg Tabs (Losartan Potassium) .[name].. 1 Tab Daily For High Blood Pressure 6)  Nitrostat 0.4 Mg Subl (Nitroglycerin) .... As Needed 7)  Adult Aspirin Low Strength 81 Mg  Tbdp (Aspirin) .... Take 1 Tablet By Mouth Once A Day-On Hold 8)  Multivitamins   Tabs (Multiple Vitamin) .... Take 1 Tablet By Mouth Once A  Day 9)  Prilosec Otc 20 Mg  Tbec (Omeprazole Magnesium) .... Take 1 Tablet By Mouth Once A Day 10)  Amoxicillin 500 Mg Caps (Amoxicillin) .[name].. 1 By Mouth Four Times Daily Until Finished. 11)  Celebrex 200 Mg Caps (Celecoxib) .[name].. 1 By Mouth Two Times A Day X1 Week and Then Once Daily 12)  Oxycodone-Acetaminophen 5-325 Mg Tabs (Oxycodone-Acetaminophen) .[name].. 1-2 By Mouth Every 4 Hours As Needed For Pain 13)  Methocarbamol 500 Mg Tabs (Methocarbamol) .[name].. 1-2 By Mouth Every 6 Hours As Needed For Spasms 14)  Xarelto 10 Mg Tabs (Rivaroxaban) .[name].. 1 By Mouth Every Day At 8pm  Allergies: 1)  ! Cardura  Past History:  Social History: Last updated: 01/30/2008 Retired--mows lawns Married Former Smoker--quit in 1960's Alcohol use-no children Worked in Teacher, adult education care  Past Medical History: #DYSPNEA - since July-Sept 2009 >Coronary artery disease. s/p CABG x 5 in 2002.  > symptoms followed lopressor Jan-July 2009 and lisinopril increase July 2009 >  Myoview on April 17, 2007 was  nonischemic with an EF of 51% and inferobasal wall scar.  > Cath Oct 2009:   1. Severe native three-vessel coronary artery disease.   2. Status post multivessel coronary bypass surgery with all grafts      patent.   3. Normal left ventricular systolic function with normal  left    ventricular filling pressures.  > PFT - Mixed pattern on spiro. Mild restn on lung volumes with near normal DLCO.  - Pattern can be explained by CABG scar - Fev1 2.2L/73%, FVC 3.21L/70%, Ratio 68 (67),  TLC 4.7L/68%, RV 1.5L/55%, DLCO 79%.  > CPST 05/06/2008: Normal effot.  Reduced VO2 max 20.5/65%, REduced AT 8.2/40%, Normal breathing reservce of 55%, submaximal heart rate response 112/77%,  flattened O2 pulse response at peak exercise - 2ml/beat at 85%. No VQ mismatch abnormalities. All c/w CIRC LIMITATION > ECHO 09/04/2008: LVH, ef 60%, mild AS,  #Hyperlipidemia #Hypertension #Benign prostatic hypertrophy #Diabetes mellitus, type II #GERD -  on prilosec as needed #Osteoarthritis s/p R TKR #Overweight - BMI 29 #Osteoarthritis - of digits #Interstitial Lung disease NOS  >Stable pulm infilratates 03/2008 -> 10/2008: ? due to GERD #Diverticulosis #Hiatal hernia #Esophageal stricture >s/p dilatation spring 2010  Past Surgical History: Cholecystectomy (jacobs ) -open  11/2005 CABG  11/2000 Adenosine cardiolite-- thinning without ischemia EF- 55%  01/2003 CP--no MI (cath- small vessel desease ) 08/2004 Cardolite neg. 04/2006 R knee arthroscopy--Dr Dawna Part  6/08 R TKR - Dr. Telford Nab 03/2010  Review of Systems       per HPI  Physical Exam  General:  alert and normal appearance.   Mouth:  Oral mucosa and oropharynx without lesions or exudates.  Teeth in good repair. Neck:  supple, no masses, no thyromegaly, no carotid bruits, and no cervical lymphadenopathy.   Lungs:  normal respiratory effort, no intercostal retractions, and no accessory muscle use.   Bibasilar coarse crackles Heart:  normal rate, regular rhythm, no murmur, and no gallop.   Pulses:  2+ rad pulses Extremities:  no edema   Impression & Recommendations:  Problem # 1:  HYPERTENSION (ICD-401.9) good control today on losartan 50 two times a day.  advised increase losartan to 100mg  daily, call us with update.  return next week for Cr. His updated medication list for this problem includes:    Hydrochlorothiazide 25 Mg Tabs (Hydrochlorothiazide) .[name]... Take 1 tablet by mouth once a day    Losartan Potassium 100 Mg Tabs (Losartan potassium) ..... One daily for blood pressure  BP today: 136/84 Prior BP: 140/80 (12/31/2009)  Prior 10 Yr Risk Heart Disease: N/A (05/05/2009)  Labs Reviewed: K+: 3.8 (07/16/2009) Creat: : 1.1 (07/16/2009)   Chol: 129 (07/16/2009)   HDL: 38.40 (07/16/2009)   LDL: 68 (07/16/2009)   TG: 113.0 (07/16/2009)  Problem # 2:  DIABETES MELLITUS, TYPE II (ICD-250.00) cbg's seem to be trending down.  advised continue metformin two times a day.   call us if consistently >250.  if so, consider starting other dm med.  watch carbs. His updated medication list for this problem includes:    Metformin Hcl 1000 Mg Tabs (Metformin hcl) .[name]... 1 two times a day before breakfast and supper    Losartan Potassium 100 Mg Tabs (Losartan potassium) ..... One daily for blood pressure    Adult Aspirin Low Strength 81 Mg Tbdp (Aspirin) .[name]... Take 1 tablet by mouth once a day-on hold  Labs Reviewed: Creat: 1.1 (07/16/2009)     Last Eye Exam: No diabetic retinopathy.    (11/24/2009) Reviewed HgBA1c results: 7.6 (04/03/2010)  7.9 (12/31/2009)  Complete Medication List: 1)  Metformin Hcl 1000 Mg Tabs (Metformin hcl) .[name].. 1 two times a day before breakfast and supper 2)  Hydrochlorothiazide 25 Mg Tabs (Hydrochlorothiazide) .... Take 1 tablet by mouth once a day 3)  Isosorbide Mononitrate Cr 60  Mg Tb24 (Isosorbide mononitrate) .... Take 1 tablet by mouth once a day 4)  Simvastatin 80 Mg Tabs (Simvastatin) .... Take one tablet by mouth daily at bedtime 5)  Losartan Potassium 100 Mg Tabs (Losartan potassium) .... One daily for blood pressure 6)  Nitrostat 0.4 Mg Subl (Nitroglycerin) .... As needed 7)  Adult Aspirin Low Strength 81 Mg Tbdp (Aspirin) .... Take 1 tablet by mouth once a day-on hold 8)  Multivitamins Tabs (Multiple vitamin) .... Take 1 tablet by mouth once a day 9)  Prilosec Otc 20 Mg Tbec (Omeprazole magnesium) .... Take 1 tablet by mouth once a day 10)  Amoxicillin 500 Mg Caps (Amoxicillin) .[name].. 1 by mouth four times daily until finished. 11)  Celebrex 200 Mg Caps (Celecoxib) .[name].. 1 by mouth two times a day x1 week and then once daily 12)  Oxycodone-acetaminophen 5-325 Mg Tabs (Oxycodone-acetaminophen) .[name].. 1-2 by mouth every 4 hours as needed for pain 13)  Methocarbamol 500 Mg Tabs (Methocarbamol) .[name].. 1-2 by mouth every 6 hours as needed for spasms 14)  Xarelto 10 Mg Tabs (Rivaroxaban) .[name].. 1 by mouth every day at 8pm  Patient  Instructions: 1)  Take metformin twice daily once in am and once at night. 2)  Keep eye on sugars.  if consistently staying high, give [place] a call (>250 fasting).  should slowly return to normal, watch simple carbs and get plenty of water. 3)  Increase losartan to 100mg  daily (increased dose). 4)  return next week for blood check [BMP, 401.9, 250.00] 5)  if blood pressure staying low (top number less than 110), change back to losartan 50mg  daily and call us. Prescriptions: LOSARTAN POTASSIUM 100 MG TABS (LOSARTAN POTASSIUM) one daily for blood pressure  #30 x 0   Entered and Authorized by:   Ria Bush  MD   Signed by:   Ria Bush  MD on 04/09/2010   Method used:   Electronically to        [facility] [phone]* (retail)       [address]       [place], [place]  [address]       Ph: [phone]       Fax: [phone]   RxID:   [id number]    Orders Added: 1)  Est. Patient Level III [id number]    Current Allergies (reviewed today): ! CARDURA  Appended Document: F/U [facility] i'd like pt to f/u with PCP in 1 mo for hematuria and bp/cbg control.  forgot to put in instructions.  can we call and have him set that up?  Appended Document: F/U [facility] Appt scheduled with patient.

## 2010-05-08 NOTE — Letter (Signed)
Summary: [facility] Clearance/[facility]   Imported By: Edmonia James 01/07/2010 11:20:59  _____________________________________________________________________  External Attachment:    Type:   Image     Comment:   External Document

## 2010-05-13 NOTE — Assessment & Plan Note (Signed)
Summary: 1MTH FOLLOW-UP   Vital Signs:  Patient profile:   77 year old male Weight:      219 pounds Temp:     98.2 degrees F oral Pulse rate:   84 / minute Pulse rhythm:   regular BP sitting:   175 / 91  (left arm) Cuff size:   large  Vitals Entered By: Edwin Dada CMA Deborra Medina) (May 06, 2010 10:58 AM) CC: 1 MONTH FOLLOW-UP   History of Present Illness: Recovering from knee replacement 6 weeks ago Pain is not bad--not sure he needs the celebrex rarely using oxycodone Still working with the therapists as outpatient  He checks BP running higher than his usual-- A999333 systolic  Checking sugars intermittently--every 2-3 days Has been coming down Highest  ~169 and lowest 139 No hypoglycemic spells  Needs diabetic shoes does have decreased circulation has numbness and some decreased sensation upon walking  Allergies: 1)  ! Cardura 2)  ! Methocarbamol (Methocarbamol)  Past History:  Past medical, surgical, family and social histories (including risk factors) reviewed for relevance to current acute and chronic problems.  Past Medical History: Reviewed history from 04/09/2010 and no changes required. #DYSPNEA - since July-Sept 2009 >Coronary artery disease. s/p CABG x 5 in 2002.  > symptoms followed lopressor Jan-July 2009 and lisinopril increase July 2009 >  Myoview on April 17, 2007 was  nonischemic with an EF of 51% and inferobasal wall scar.  > Cath Oct 2009:   1. Severe native three-vessel coronary artery disease.   2. Status post multivessel coronary bypass surgery with all grafts      patent.   3. Normal left ventricular systolic function with normal left    ventricular filling pressures.  > PFT - Mixed pattern on spiro. Mild restn on lung volumes with near normal DLCO.  - Pattern can be explained by CABG scar - Fev1 2.2L/73%, FVC 3.21L/70%, Ratio 68 (67),  TLC 4.7L/68%, RV 1.5L/55%, DLCO 79%.  > CPST 05/06/2008: Normal effot.  Reduced VO2 max  20.5/65%, REduced AT 8.2/40%, Normal breathing reservce of 55%, submaximal heart rate response 112/77%,  flattened O2 pulse response at peak exercise - 36ml/beat at 85%. No VQ mismatch abnormalities. All c/w CIRC LIMITATION > ECHO 09/04/2008: LVH, ef 60%, mild AS,  #Hyperlipidemia #Hypertension #Benign prostatic hypertrophy #Diabetes mellitus, type II #GERD - on prilosec as needed #Osteoarthritis s/p R TKR #Overweight - BMI 29 #Osteoarthritis - of digits #Interstitial Lung disease NOS  >Stable pulm infilratates 03/2008 -> 10/2008: ? due to GERD #Diverticulosis #Hiatal hernia #Esophageal stricture >s/p dilatation spring 2010  Past Surgical History: Reviewed history from 04/09/2010 and no changes required. Cholecystectomy ([facility] ) -open  11/2005 CABG  11/2000 Adenosine cardiolite-- thinning without ischemia EF- 55%  01/2003 CP--no MI (cath- small vessel desease ) 08/2004 Cardolite neg. 04/2006 R knee arthroscopy--Dr Dawna Part  6/08 R TKR - Dr. Telford Nab 03/2010  Family History: Reviewed history from 04/21/2007 and no changes required. Mom died from COPD in her 59's Dad died from heart disease in his 12's 3 brothers--2 died (1 from DM complications, 1 from colon cancer) 5 sisters--1 died from alcoholsim  Social History: Reviewed history from 01/30/2008 and no changes required. Retired--mows lawns Married Former Smoker--quit in 1960's Alcohol use-no children Worked in lawn care  Review of Systems       appetite is good sleeps okay but occ kept up by pain in knee. Still swollen and he ice packs it weight is stable  Physical Exam  General:  alert and normal appearance.   Neck:  supple, no masses, no thyromegaly, and no cervical lymphadenopathy.   Lungs:  normal respiratory effort, no intercostal retractions, no accessory muscle use, and normal breath sounds.   Heart:  normal rate, regular rhythm, and no gallop.   ?soft systolic murmur towards apex Msk:  still with swelling  around right knee Extremities:  no edema Psych:  normally interactive, good eye contact, not anxious appearing, and not depressed appearing.     Impression & Recommendations:  Problem # 1:  DIABETES MELLITUS, WITH VASCULAR COMPLICATIONS (A999333) Assessment Unchanged seems to have reasonable control will recheck A1c next time Rx for shoes signed--has early circulatory changes and early neuropathy  His updated medication list for this problem includes:    Metformin Hcl 1000 Mg Tabs (Metformin hcl) .[name]... 1 two times a day before breakfast and supper    Losartan Potassium 100 Mg Tabs (Losartan potassium) ..... One daily for blood pressure    Adult Aspirin Low Strength 81 Mg Tbdp (Aspirin) .[name]... Take 1 tablet by mouth once a day-on hold  Labs Reviewed: Creat: 1.2 (04/16/2010)     Last Eye Exam: No diabetic retinopathy.    (11/24/2009) Reviewed HgBA1c results: 7.6 (04/03/2010)  7.9 (12/31/2009)  Problem # 2:  HYPERTENSION (ICD-401.9) Assessment: Deteriorated will try off the celebrex and add med if still up next time  His updated medication list for this problem includes:    Hydrochlorothiazide 25 Mg Tabs (Hydrochlorothiazide) .[name]... Take 1 tablet by mouth once a day    Losartan Potassium 100 Mg Tabs (Losartan potassium) ..... One daily for blood pressure  BP today: 175/91 Prior BP: 136/84 (04/09/2010)  Prior 10 Yr Risk Heart Disease: N/A (05/05/2009)  Labs Reviewed: K+: 4.4 (04/16/2010) Creat: : 1.2 (04/16/2010)   Chol: 129 (07/16/2009)   HDL: 38.40 (07/16/2009)   LDL: 68 (07/16/2009)   TG: 113.0 (07/16/2009)  Problem # 3:  UNSPEC POLYARTHROPATHY/POLYARTHRITIS SITE UNSPEC (ICD-716.50) Assessment: Improved slow recovery from TKR will change to tylenol and add tramadol as needed   Complete Medication List: 1)  Xarelto 10 Mg Tabs (Rivaroxaban) .[name].. 1 by mouth every day at 8pm 2)  Metformin Hcl 1000 Mg Tabs (Metformin hcl) .[name].. 1 two times a day before breakfast and  supper 3)  Hydrochlorothiazide 25 Mg Tabs (Hydrochlorothiazide) .... Take 1 tablet by mouth once a day 4)  Isosorbide Mononitrate Cr 60 Mg Tb24 (Isosorbide mononitrate) .... Take 1 tablet by mouth once a day 5)  Simvastatin 80 Mg Tabs (Simvastatin) .... Take one tablet by mouth daily at bedtime 6)  Losartan Potassium 100 Mg Tabs (Losartan potassium) .... One daily for blood pressure 7)  Nitrostat 0.4 Mg Subl (Nitroglycerin) .... As needed 8)  Adult Aspirin Low Strength 81 Mg Tbdp (Aspirin) .... Take 1 tablet by mouth once a day-on hold 9)  Multivitamins Tabs (Multiple vitamin) .... Take 1 tablet by mouth once a day 10)  Prilosec Otc 20 Mg Tbec (Omeprazole magnesium) .... Take 1 tablet by mouth once a day 11)  Tramadol Hcl 50 Mg Tabs (Tramadol hcl) .[name].. 1 tab by mouth three times a day as needed for arthritis pain if tylenol not enough  Patient Instructions: 1)  Please schedule a follow-up appointment in 3 months .  Prescriptions: TRAMADOL HCL 50 MG TABS (TRAMADOL HCL) 1 tab by mouth three times a day as needed for arthritis pain if tylenol not enough  #90 x 0   Entered and Authorized by:   [facility]  Silvio Pate MD   Signed by:   Claris Gower MD on 05/06/2010   Method used:   Electronically to        [facility] [phone]* (retail)       [address]       [place], [place]  [address]       Ph: [phone]       Fax: [phone]   RxID:   [id number]      Current Allergies (reviewed today): ! CARDURA ! METHOCARBAMOL (METHOCARBAMOL)

## 2010-05-21 NOTE — Letter (Signed)
Summary: Diabetic Footwear Orders  Diabetic Footwear Orders   Imported By: Jamelle Haring 05/13/2010 08:21:40  _____________________________________________________________________  External Attachment:    Type:   Image     Comment:   External Document

## 2010-06-03 ENCOUNTER — Telehealth (INDEPENDENT_AMBULATORY_CARE_PROVIDER_SITE_OTHER): Payer: Self-pay | Admitting: *Deleted

## 2010-06-09 ENCOUNTER — Encounter: Payer: Self-pay | Admitting: Family Medicine

## 2010-06-09 ENCOUNTER — Ambulatory Visit (INDEPENDENT_AMBULATORY_CARE_PROVIDER_SITE_OTHER): Payer: Medicare Other | Admitting: Family Medicine

## 2010-06-09 DIAGNOSIS — I10 Essential (primary) hypertension: Secondary | ICD-10-CM

## 2010-06-11 NOTE — Progress Notes (Signed)
Summary: BP is elevated  Phone Note Call from Patient   Caller: Patient Call For: Claris Gower MD Reason for Call: Talk to Doctor Summary of Call: Patient says that his blood pressure is up this morning. When he checked it with his cuff it was 172/98. [facility] and [facility] checked it and it was 171/96. He says that he feels fine, no headache or dizziness, no chest pain, having some SOB, but he says that is usual for him. He is asking what he should do.  Initial call taken by: Lacretia Nicks,  June 03, 2010 11:43 AM  Follow-up for Phone Call        this is about where it was the last time here  I was hoping it would go down off the celebrex If it stays up, we will have to add a med but this is not an emergency  If stays up over the next couple of weeks, he should set up appt. If he has any symptoms, I should see him then Follow-up by: Claris Gower MD,  June 03, 2010 1:58 PM  Additional Follow-up for Phone Call Additional follow up Details #1::        Patient Advised.   Lugene Fuquay CMA Deborra Medina)  June 03, 2010 2:41 PM

## 2010-06-12 NOTE — Discharge Summary (Signed)
[name], [name]               ACCOUNT NO.:  [id number]  MEDICAL RECORD NO.:  [id number]          PATIENT TYPE:  INP  LOCATION:  [place]                         FACILITY:  [facility]  PHYSICIAN:  Annalyse Langlais L. Rendall, M.D.  DATE OF BIRTH:  1931-12-04  DATE OF ADMISSION:  04/02/2010 DATE OF DISCHARGE:  04/05/2010                              DISCHARGE SUMMARY   ADMISSION DIAGNOSES: 1. End-stage osteoarthritis, right knee. 2. Hypertension. 3. Type 2 diabetes mellitus. 4. Gastroesophageal reflux disease. 5. Coronary artery disease with history of coronary artery bypass     grafting. 6. Osteoarthritis, bilateral hips.  DISCHARGE DIAGNOSES: 1. End-stage osteoarthritis right knee, status post right total knee     arthroplasty. 2. Acute blood loss anemia secondary to surgery. 3. Hypertension. 4. Type 2 diabetes mellitus. 5. Gastroesophageal reflux disease. 6. Coronary artery disease with history of coronary artery bypass     grafting. 7. Osteoarthritis, bilateral hips.  SURGICAL PROCEDURES:  On April 02, 2010, [name] underwent a right total knee arthroplasty with computer navigation by Dr. Jenny Reichmann L. Rendall, assisted by Zenaida Deed, PA-C.  He had a DePuy primary femoral component cemented size large right placed with a metal backed patella cemented size large, a tibial tray rotating platform, MBT keel size 5 cemented with an LCS complete RP insert size large 12.5 mm thickness.  COMPLICATIONS:  None.  CONSULTS:  Physical therapy consult April 03, 2010.  HISTORY OF PRESENT ILLNESS:  This 77 year old white male patient presented to Dr. Telford Nab with a 5-year history of gradual onset progressive right knee pain without injury or prior surgery.  Pain at this time is a constant throb to sharp sensation diffuse about the knee without radiation.  Nothing makes it worse and it decreases with hydrocodone.  The knee does swell and give way.  He has failed conservative treatment and  because of that, he is presenting for right knee replacement.  HOSPITAL COURSE:  [name] tolerated his surgical procedure well without immediate postoperative complications.  He was transferred to the orthopedic floor.  T-max on postop day #1 was 99.1, vitals were stable.  Hemoglobin 11.5, hematocrit 34.5.  Dressing was intact to the right knee.  He was tolerating CPM.  He was started on therapy per protocol and weaned off his oxygen.  On postop day #2, his dressing was changed, drain was discontinued, and he was afebrile, vitals stable.  He had some difficulty with extension of the knee but otherwise was doing well with therapy.  Postop day #3, he continued to do well with therapy, afebrile, vitals stable.  Hemoglobin 10.1. It was felt he was doing well enough for discharge home and was discharged home at that time.  DISCHARGE INSTRUCTIONS:  DIET:  He is to resume his regular prehospitalization diet. MEDICATIONS:  Please see the home med rec sheet for complete documentation of his medications, but he was given several new meds including Celebrex 200 mg, Robaxin 500 mg, Percocet 5/325, and Xarelto 10 mg for 2 weeks after surgery.  Again, see the med discharge home rec sheet for complete documentation of his medications. ACTIVITY:  He can be  out of bed, weightbearing as tolerated on the right leg with use of the walker.  No lifting or driving for 6 weeks.  He can walk up steps and increase activity slowly.  Please see the white total joint discharge sheet for further activity instructions.  WOUND CARE:  Please see the white total joint discharge sheet for further wound care instructions.  FOLLOWUP:  He needs to follow up with Dr. Telford Nab in our office Tuesday, April 14, 2010, and call [phone] for that appointment.  He needs to follow up with his medical doctor at [facility] on Tuesday or Wednesday.  He is arranged for home health care per interim.  LABORATORY DATA:   Hemoglobin/hematocrit ranged from 15.1 and 44.6 on the April 02, 2010, to 10.1 and 30 on April 05, 2010.  Platelets went from 155 on the April 02, 2010, to 133 on April 04, 2010, to 142 on April 05, 2010.  Glucose ranged from 239 on the April 02, 2010 to 191 on April 04, 2010.  Sodium went from 138 on the March 23, 2010, to 133 on April 04, 2010.  Calcium dropped to a low of 8.3 on April 03, 2010. Hemoglobin A1c on April 03, 2010, was 7.6%.  His urinalysis on the April 02, 2010, showed rare epithelials, 0-2 white cells, and 11-20 red cells.  All other laboratory studies were within normal limits.     Vonita Moss Duffy, P.A.   ______________________________ Karen Chafe. Telford Nab, M.D.    KED/MEDQ  D:  04/22/2010  T:  04/22/2010  Job:  [id number]  Electronically Signed by Zenaida Deed P.A. on 05/27/2010 10:08:54 AM Electronically Signed by Oretha Caprice M.D. on 06/12/2010 01:16:47 PM

## 2010-06-15 LAB — GLUCOSE, CAPILLARY
Glucose-Capillary: 131 mg/dL — ABNORMAL HIGH (ref 70–99)
Glucose-Capillary: 154 mg/dL — ABNORMAL HIGH (ref 70–99)
Glucose-Capillary: 169 mg/dL — ABNORMAL HIGH (ref 70–99)
Glucose-Capillary: 183 mg/dL — ABNORMAL HIGH (ref 70–99)
Glucose-Capillary: 187 mg/dL — ABNORMAL HIGH (ref 70–99)
Glucose-Capillary: 188 mg/dL — ABNORMAL HIGH (ref 70–99)
Glucose-Capillary: 210 mg/dL — ABNORMAL HIGH (ref 70–99)

## 2010-06-15 LAB — BASIC METABOLIC PANEL
CO2: 27 mEq/L (ref 19–32)
Calcium: 8.8 mg/dL (ref 8.4–10.5)
Chloride: 98 mEq/L (ref 96–112)
Creatinine, Ser: 1.2 mg/dL (ref 0.4–1.5)
GFR calc Af Amer: >60 mL/min (ref >60)
Glucose, Bld: 191 mg/dL — ABNORMAL HIGH (ref 70–99)
Potassium: 4.2 mEq/L (ref 3.5–5.1)

## 2010-06-15 LAB — PROTIME-INR: Prothrombin Time: 13.4 seconds (ref 11.6–15.2)

## 2010-06-15 LAB — URINALYSIS, ROUTINE W REFLEX MICROSCOPIC
Bilirubin Urine: NEGATIVE
Glucose, UA: 250 mg/dL — AB
Ketones, ur: NEGATIVE mg/dL
Protein, ur: 30 mg/dL — AB

## 2010-06-15 LAB — SURGICAL PCR SCREEN: Staphylococcus aureus: POSITIVE — AB

## 2010-06-15 LAB — CBC
HCT: 30 % — ABNORMAL LOW (ref 39.0–52.0)
HCT: 34.5 % — ABNORMAL LOW (ref 39.0–52.0)
HCT: 44.6 % (ref 39.0–52.0)
Hemoglobin: 10.7 g/dL — ABNORMAL LOW (ref 13.0–17.0)
MCH: 28.7 pg (ref 26.0–34.0)
MCHC: 33.6 g/dL (ref 30.0–36.0)
MCV: 85.6 fL (ref 78.0–100.0)
MCV: 85.7 fL (ref 78.0–100.0)
RBC: 4.03 MIL/uL — ABNORMAL LOW (ref 4.22–5.81)
RBC: 5.21 MIL/uL (ref 4.22–5.81)
RDW: 13.5 % (ref 11.5–15.5)
RDW: 13.8 % (ref 11.5–15.5)
WBC: 10.1 10*3/uL (ref 4.0–10.5)
WBC: 7.4 10*3/uL (ref 4.0–10.5)
WBC: 9.1 10*3/uL (ref 4.0–10.5)

## 2010-06-15 LAB — COMPREHENSIVE METABOLIC PANEL
ALT: 29 U/L (ref 0–53)
Albumin: 3.9 g/dL (ref 3.5–5.2)
Alkaline Phosphatase: 40 U/L (ref 39–117)
Chloride: 100 mEq/L (ref 96–112)
Glucose, Bld: 239 mg/dL — ABNORMAL HIGH (ref 70–99)
Potassium: 4.5 mEq/L (ref 3.5–5.1)
Sodium: 138 mEq/L (ref 135–145)
Total Protein: 7.9 g/dL (ref 6.0–8.3)

## 2010-06-15 LAB — URINE MICROSCOPIC-ADD ON

## 2010-06-15 LAB — DIFFERENTIAL
Basophils Relative: 1 % (ref 0–1)
Eosinophils Absolute: 0.4 10*3/uL (ref 0.0–0.7)
Eosinophils Relative: 5 % (ref 0–5)
Lymphs Abs: 2.2 10*3/uL (ref 0.7–4.0)

## 2010-06-15 LAB — CROSSMATCH: Antibody Screen: NEGATIVE

## 2010-06-15 LAB — ABO/RH: ABO/RH(D): A NEG

## 2010-06-16 NOTE — Assessment & Plan Note (Signed)
Summary: elevated blood pressure/alc   Vital Signs:  Patient profile:   77 year old male Weight:      211.25 pounds Temp:     97.8 degrees F oral Pulse rate:   88 / minute Pulse rhythm:   regular BP sitting:   160 / 100  (left arm) Cuff size:   large  Vitals Entered By: Maudie Mercury Dance CMA (Helen) (June 09, 2010 10:30 AM) CC: BP is staying elevated   CC:  BP is staying elevated.  History of Present Illness: CC: BP staying high  BP staying high.  Checks at home, last 187/102.  Taking losartan and hctz, compliant.  increased to 100mg  last visit, hasn't helped much.  scared because sister with stroke 5 years ago.  No HA, vision changes, chest pain/tightness, urinary changes, LE swelling.  Staying short of breath intermittently for years, no change (has seen pulm).  sugars running good.  also feeling tired, nauseated intermittently.  No vomiting, diarrhea/constipation, fevers/chills.  No coughing, congestion.  states pain from recent knee replacement reasonably controlled.  as far as diet, tries to stay away from salts/sweets/fried foods.  eats vegetables.  doesn't add salt to diet.  getting plenty of water in.  on simvastatin for several years now.  LDL 68 last check 07/2009.  Current Medications (verified): 1)  Metformin Hcl 1000 Mg Tabs (Metformin Hcl) .[name].. 1 Two Times A Day Before Breakfast and Supper 2)  Hydrochlorothiazide 25 Mg Tabs (Hydrochlorothiazide) .... Take 1 Tablet By Mouth Once A Day 3)  Isosorbide Mononitrate Cr 60 Mg  Tb24 (Isosorbide Mononitrate) .... Take 1 Tablet By Mouth Once A Day 4)  Simvastatin 80 Mg Tabs (Simvastatin) .... Take One Tablet By Mouth Daily At Bedtime 5)  Losartan Potassium 100 Mg Tabs (Losartan Potassium) .... One Daily For Blood Pressure 6)  Nitrostat 0.4 Mg Subl (Nitroglycerin) .... As Needed 7)  Adult Aspirin Low Strength 81 Mg  Tbdp (Aspirin) .... Take 1 Tablet By Mouth Once A Day 8)  Multivitamins   Tabs (Multiple Vitamin) .... Take 1 Tablet  By Mouth Once A Day 9)  Prilosec Otc 20 Mg  Tbec (Omeprazole Magnesium) .... Take 1 Tablet By Mouth Once A Day 10)  Tramadol Hcl 50 Mg Tabs (Tramadol Hcl) .[name].. 1 Tab By Mouth Three Times A Day As Needed For Arthritis Pain If Tylenol Not Enough 11)  Tylenol Extra Strength 500 Mg Tabs (Acetaminophen) .... As Needed  Allergies: 1)  ! Cardura 2)  ! Methocarbamol (Methocarbamol)  Past History:  Past Medical History: Last updated: 04/09/2010 #DYSPNEA - since July-Sept 2009 >Coronary artery disease. s/p CABG x 5 in 2002.  > symptoms followed lopressor Jan-July 2009 and lisinopril increase July 2009 >  Myoview on April 17, 2007 was  nonischemic with an EF of 51% and inferobasal wall scar.  > Cath Oct 2009:   1. Severe native three-vessel coronary artery disease.   2. Status post multivessel coronary bypass surgery with all grafts      patent.   3. Normal left ventricular systolic function with normal left    ventricular filling pressures.  > PFT - Mixed pattern on spiro. Mild restn on lung volumes with near normal DLCO.  - Pattern can be explained by CABG scar - Fev1 2.2L/73%, FVC 3.21L/70%, Ratio 68 (67),  TLC 4.7L/68%, RV 1.5L/55%, DLCO 79%.  > CPST 05/06/2008: Normal effot.  Reduced VO2 max 20.5/65%, REduced AT 8.2/40%, Normal breathing reservce of 55%, submaximal heart rate response 112/77%,  flattened O2 pulse response at peak exercise - 55ml/beat at 85%. No VQ mismatch abnormalities. All c/w CIRC LIMITATION > ECHO 09/04/2008: LVH, ef 60%, mild AS,  #Hyperlipidemia #Hypertension #Benign prostatic hypertrophy #Diabetes mellitus, type II #GERD - on prilosec as needed #Osteoarthritis s/p R TKR #Overweight - BMI 29 #Osteoarthritis - of digits #Interstitial Lung disease NOS  >Stable pulm infilratates 03/2008 -> 10/2008: ? due to GERD #Diverticulosis #Hiatal hernia #Esophageal stricture >s/p dilatation spring 2010  Social History: Last updated: 01/30/2008 Retired--mows  lawns Married Former Smoker--quit in 1960's Alcohol use-no children Worked in Teacher, adult education care  Review of Systems       per HPI  Physical Exam  General:  alert and normal appearance.   Head:  normocephalic and atraumatic Mouth:  Oral mucosa and oropharynx without lesions or exudates.  Teeth in good repair. Neck:  supple, no masses, no thyromegaly, and no cervical lymphadenopathy.  no bruits, no JVD Lungs:  normal respiratory effort, no intercostal retractions, no accessory muscle use, and normal breath sounds.   Heart:  2/6 SEM best at LUSB Abdomen:  soft and non-tender.  no abd/renal bruits Pulses:  2+ rad pulses, brisk cap refill Extremities:  no pedal edema   Impression & Recommendations:  Problem # 1:  HYPERTENSION (ICD-401.9) Assessment Deteriorated continues elevated.  in setting of h/o AS. continue current meds (ARB, HCTZ).  add amlodipine, change statin from simva to atorva given amlodipine interaction. return if BP staying elevated.  call us with update in 1 wk. discussed healthy eating to lower bp = avoid salt.  more potassium. could consider spironolactone if not better.  His updated medication list for this problem includes:    Hydrochlorothiazide 25 Mg Tabs (Hydrochlorothiazide) .[name]... Take 1 tablet by mouth once a day    Losartan Potassium 100 Mg Tabs (Losartan potassium) ..... One daily for blood pressure    Amlodipine Besylate 5 Mg Tabs (Amlodipine besylate) .[name]... Take one daily for blood pressure  BP today: 160/100 Prior BP: 175/91 (05/06/2010)  Prior 10 Yr Risk Heart Disease: N/A (05/05/2009)  Labs Reviewed: K+: 4.4 (04/16/2010) Creat: : 1.2 (04/16/2010)   Chol: 129 (07/16/2009)   HDL: 38.40 (07/16/2009)   LDL: 68 (07/16/2009)   TG: 113.0 (07/16/2009)  Problem # 2:  AORTIC STENOSIS (ICD-424.1)  His updated medication list for this problem includes:    Adult Aspirin Low Strength 81 Mg Tbdp (Aspirin) .[name]... Take 1 tablet by mouth once a day  Complete  Medication List: 1)  Metformin Hcl 1000 Mg Tabs (Metformin hcl) .[name].. 1 two times a day before breakfast and supper 2)  Hydrochlorothiazide 25 Mg Tabs (Hydrochlorothiazide) .... Take 1 tablet by mouth once a day 3)  Isosorbide Mononitrate Cr 60 Mg Tb24 (Isosorbide mononitrate) .... Take 1 tablet by mouth once a day 4)  Atorvastatin Calcium 20 Mg Tabs (Atorvastatin calcium) .... Take one nightly for blood pressure 5)  Losartan Potassium 100 Mg Tabs (Losartan potassium) .... One daily for blood pressure 6)  Nitrostat 0.4 Mg Subl (Nitroglycerin) .... As needed 7)  Adult Aspirin Low Strength 81 Mg Tbdp (Aspirin) .... Take 1 tablet by mouth once a day 8)  Multivitamins Tabs (Multiple vitamin) .... Take 1 tablet by mouth once a day 9)  Prilosec Otc 20 Mg Tbec (Omeprazole magnesium) .... Take 1 tablet by mouth once a day 10)  Tramadol Hcl 50 Mg Tabs (Tramadol hcl) .[name].. 1 tab by mouth three times a day as needed for arthritis pain if tylenol not enough 11)  Tylenol Extra Strength 500 Mg Tabs (Acetaminophen) .... As needed 12)  Amlodipine Besylate 5 Mg Tabs (Amlodipine besylate) .... Take one daily for blood pressure  Patient Instructions: 1)  keep previous appointment.  2)  Stop simvastatin. 3)  For cholesterol, start another medicine called atorvastatin 20mg  nightly. 4)  Start amlodipine 5mg  daily. 5)  Keep eye on blood pressure, if not running beter after a week, call us with update. 6)  If worsening shortness of breath, any chest pain or tightness or dizziness, please return sooner. Prescriptions: AMLODIPINE BESYLATE 5 MG TABS (AMLODIPINE BESYLATE) take one daily for blood pressure  #30 x 3   Entered and Authorized by:   Ria Bush  MD   Signed by:   Ria Bush  MD on 06/09/2010   Method used:   Electronically to        [facility] [phone]* (retail)       [address]       [place], [place]  [address]       Ph: [phone]       Fax: [phone]    [place]:   [phone] ATORVASTATIN CALCIUM 20 MG TABS (ATORVASTATIN CALCIUM) take one nightly for blood pressure  #30 x 3   Entered and Authorized by:   Ria Bush  MD   Signed by:   Ria Bush  MD on 06/09/2010   Method used:   Electronically to        [facility] [phone]* (retail)       [address]       [place], [place]  [address]       Ph: [phone]       Fax: [phone]   RxID:   [id number]    Orders Added: 1)  Est. Patient Level IV [id number]    Current Allergies (reviewed today): ! CARDURA ! METHOCARBAMOL (METHOCARBAMOL)

## 2010-06-22 ENCOUNTER — Encounter: Payer: Self-pay | Admitting: Internal Medicine

## 2010-06-23 ENCOUNTER — Encounter: Payer: Self-pay | Admitting: Internal Medicine

## 2010-06-30 ENCOUNTER — Ambulatory Visit: Payer: Self-pay | Admitting: Internal Medicine

## 2010-07-13 LAB — GLUCOSE, CAPILLARY: Glucose-Capillary: 136 mg/dL — ABNORMAL HIGH (ref 70–99)

## 2010-08-04 ENCOUNTER — Ambulatory Visit: Payer: Self-pay | Admitting: Internal Medicine

## 2010-08-10 ENCOUNTER — Encounter: Payer: Self-pay | Admitting: Internal Medicine

## 2010-08-10 ENCOUNTER — Ambulatory Visit (INDEPENDENT_AMBULATORY_CARE_PROVIDER_SITE_OTHER): Payer: Medicare Other | Admitting: Internal Medicine

## 2010-08-10 VITALS — BP 158/70 | HR 78 | Temp 98.0°F | Ht 73.0 in | Wt 217.0 lb

## 2010-08-10 DIAGNOSIS — E119 Type 2 diabetes mellitus without complications: Secondary | ICD-10-CM

## 2010-08-10 DIAGNOSIS — E1142 Type 2 diabetes mellitus with diabetic polyneuropathy: Secondary | ICD-10-CM | POA: Insufficient documentation

## 2010-08-10 DIAGNOSIS — I2510 Atherosclerotic heart disease of native coronary artery without angina pectoris: Secondary | ICD-10-CM

## 2010-08-10 DIAGNOSIS — Z79899 Other long term (current) drug therapy: Secondary | ICD-10-CM

## 2010-08-10 DIAGNOSIS — I10 Essential (primary) hypertension: Secondary | ICD-10-CM

## 2010-08-10 DIAGNOSIS — N400 Enlarged prostate without lower urinary tract symptoms: Secondary | ICD-10-CM

## 2010-08-10 DIAGNOSIS — G5790 Unspecified mononeuropathy of unspecified lower limb: Secondary | ICD-10-CM

## 2010-08-10 DIAGNOSIS — E785 Hyperlipidemia, unspecified: Secondary | ICD-10-CM

## 2010-08-10 MED ORDER — GABAPENTIN 300 MG PO CAPS: 300.0000 mg | ORAL_CAPSULE | Freq: Every evening | ORAL | Status: DC

## 2010-08-10 MED ORDER — PRAVASTATIN SODIUM 40 MG PO TABS: 40.0000 mg | ORAL_TABLET | Freq: Every day | ORAL | Status: DC

## 2010-08-10 NOTE — Patient Instructions (Signed)
Please stop the atorvastatin and start the pravastatin.  Please set up labs in about 2 months---lipid, hepatic (272.4)  Please stop the lyrica and try the gabapentin at bedtime for the burning in your feet

## 2010-08-10 NOTE — Progress Notes (Signed)
Subjective:    Patient ID: [name], male    DOB: [birth year], 77 y.o.   MRN: [id number]  HPI DOing okay Expressed condolences on his brother's death  Doing fine on new BP med Home BP usually 140-150's/70 or so No headaches  No chest pain No SOB  Does get some right head congestion at times Feels like it is in ear Hearing is off  No problems on atorvastatin but it cost $45 Asks for different med  Checks sugars twice a week 130-140 usually Highest 168 No hypoglycemic reactions Went to podiatrist for burning pain ---started on lyrica. Helps but very expensive  Voiding okay Nocturia x 2--stable  Current outpatient prescriptions:acetaminophen (TYLENOL) 500 MG tablet, Take 500 mg by mouth as needed.  , Disp: , Rfl: ;  amLODipine (NORVASC) 5 MG tablet, Take 5 mg by mouth daily. For blood pressure , Disp: , Rfl: ;  aspirin 81 MG tablet, Take 81 mg by mouth daily.  , Disp: , Rfl: ;  atorvastatin (LIPITOR) 20 MG tablet, Take 20 mg by mouth. Nightly for blood pressure , Disp: , Rfl:  hydrochlorothiazide 25 MG tablet, Take 25 mg by mouth daily.  , Disp: , Rfl: ;  isosorbide mononitrate (IMDUR) 60 MG 24 hr tablet, Take 60 mg by mouth daily.  , Disp: , Rfl: ;  losartan (COZAAR) 100 MG tablet, Take 100 mg by mouth daily. For blood pressure , Disp: , Rfl: ;  metFORMIN (GLUCOPHAGE) 1000 MG tablet, Take 1,000 mg by mouth 2 (two) times daily with a meal.  , Disp: , Rfl:  Multiple Vitamins-Minerals (ICAPS) CAPS, Take 1 capsule by mouth daily.  , Disp: , Rfl: ;  nitroGLYCERIN (NITROSTAT) 0.4 MG SL tablet, Place 0.4 mg under the tongue every 5 (five) minutes as needed. Chest pain , Disp: , Rfl: ;  omeprazole (PRILOSEC OTC) 20 MG tablet, Take 20 mg by mouth daily.  , Disp: , Rfl:  traMADol (ULTRAM) 50 MG tablet, Take 50 mg by mouth every 8 (eight) hours as needed. For arthritis pain, if tylenol is not enough , Disp: , Rfl: ;  DISCONTD: Multiple Vitamin (MULTIVITAMIN) tablet, Take 1 tablet by mouth  daily.  , Disp: , Rfl:   Past Medical History  Diagnosis Date  . Dyspnea 2009 since July -Sept    05/06/08-CPST-  normal effort, reduced VO2 max 20.5 /65%, reduced at 8.2/ 40%, normal breathing resetvca of 55%, submaximal heart rate response 112/77%, flattened o2 pluse response at peak exercise-12 ml/beat @ 85%, No VQ mismatch abnormalities, All c/w CIRC Limitation  . Hyperlipidemia   . Hypertension   . Diabetes mellitus   . GERD (gastroesophageal reflux disease)   . Arthritis     osteoarthritis, s/p R TKR, and digits  . Overweight (BMI 25.0-29.9)     BMI 29  . Interstitial lung disease     NOS  . Pulmonary infiltrates 12-09    stable, 7/10: ? due to GERD  . Diverticulosis   . Hiatal hernia   . Esophageal stricture     s/p dilation spring 2010  . CAD (coronary artery disease)     symptoms  followed lopressor Jan-Jul 09 and lisinopril increase July 09, 1/09 Myoview- non ischemic with an EF of 51% and inferobasal wall scar, 10/09 Cath- severe native three-vessel coronary artery disease, s/p multivessel coronary bypass graft with all grafts, Normal left ventricular systolic function with normal left ventricular filling pressures,   . History of PFTs  mixed pattern on spiro. mild restn on lung volumes with near normal DLCO. Pattern can be explained by CABG scar. Fev1 2.2L/73%, ratio 68 (67), TLC 4.7/68%,RV 1.5L/55%,DLCO 79%  . CAD (coronary artery disease)     09/04/08 ECHO- LVH, ef 60%, mild AS,01/2003-Adenosine cardiolite- thinning without ischemia EF 55%, 1/08 -Cardiolite -neg    Past Surgical History  Procedure Date  . Cholecystectomy 11/2005    [name]- open  . Coronary artery bypass graft 11/2000    x 5  . Cardiac catheterization 5/06    CP- no MI, Cath- small vessell disease  . Total knee arthroplasty 6/08    R knee- Riddle  . Total knee arthroplasty 12/11    Right- Dr Telford Nab    Family History  Problem Relation Age of Onset  . COPD Mother   . Heart disease Father   .  Diabetes Brother   . Colon cancer Brother   . Alcohol abuse Sister     History   Social History  . Marital Status: Married    Spouse Name: N/A    Number of Children: N/A  . Years of Education: N/A   Occupational History  . lawn mower     retired   Social History Main Topics  . Smoking status: Former Smoker    Quit date: 04/06/1963  . Smokeless tobacco: Not on file  . Alcohol Use: No  . Drug Use: Not on file  . Sexually Active: Not on file   Other Topics Concern  . Not on file   Social History Narrative   Has children   Review of Systems Appetite is fine Weight is stable Sleeps well Still stiff from TKR---uses tylenol or tramadol prn      Objective:   Physical Exam  Constitutional: He appears well-developed and well-nourished. No distress.  HENT:       Right canal occluded with cerumen---discussed regimen to clear it out  Neck: Normal range of motion. No thyromegaly present.  Cardiovascular: Normal rate, regular rhythm and intact distal pulses.  Exam reveals no gallop.   Murmur heard.      Gr 2/6 aortic systolic murmur  Pulmonary/Chest: Effort normal. No respiratory distress. He has no wheezes. He has rales.       Fine bibasilar crackles  Abdominal: Soft. There is no tenderness.  Musculoskeletal: Normal range of motion. He exhibits no edema and no tenderness.  Lymphadenopathy:    He has no cervical adenopathy.  Psychiatric: He has a normal mood and affect. His behavior is normal. Judgment and thought content normal.          Assessment & Plan:

## 2010-08-11 LAB — CBC WITH DIFFERENTIAL/PLATELET
Basophils Relative: 0.5 % (ref 0.0–3.0)
Eosinophils Absolute: 0.4 10*3/uL (ref 0.0–0.7)
HCT: 35 % — ABNORMAL LOW (ref 39.0–52.0)
Lymphs Abs: 2.6 10*3/uL (ref 0.7–4.0)
MCHC: 34.4 g/dL (ref 30.0–36.0)
MCV: 82.9 fl (ref 78.0–100.0)
Monocytes Absolute: 0.7 10*3/uL (ref 0.1–1.0)
Neutrophils Relative %: 56.1 % (ref 43.0–77.0)
RBC: 4.22 Mil/uL (ref 4.22–5.81)

## 2010-08-11 LAB — BASIC METABOLIC PANEL
BUN: 23 mg/dL (ref 6–23)
CO2: 30 mEq/L (ref 19–32)
Chloride: 103 mEq/L (ref 96–112)
Creatinine, Ser: 1.5 mg/dL (ref 0.4–1.5)

## 2010-08-11 LAB — TSH: TSH: 1.24 u[IU]/mL (ref 0.35–5.50)

## 2010-08-11 LAB — HEMOGLOBIN A1C: Hgb A1c MFr Bld: 7.7 % — ABNORMAL HIGH (ref 4.6–6.5)

## 2010-08-17 ENCOUNTER — Encounter: Payer: Self-pay | Admitting: *Deleted

## 2010-08-18 NOTE — Cardiovascular Report (Signed)
[name], [name]               ACCOUNT NO.:  [id number]   MEDICAL RECORD NO.:  [id number]          PATIENT TYPE:  OIB   LOCATION:  NA                           FACILITY:  [facility]   PHYSICIAN:  Oluwatoni Melendrez. Burt Knack, MD  DATE OF BIRTH:  [birth year]   DATE OF PROCEDURE:  01/10/2008  DATE OF DISCHARGE:                            CARDIAC CATHETERIZATION   PROCEDURE:  1. Left heart catheterization.  2. Selective coronary angiography.  3. Left ventricular angiography.  4. Saphenous vein graft angiography.  5. Left internal mammary artery angiography.   INDICATIONS:  [name] is a 77 year old gentleman who was referred  yesterday as an outpatient for progressive angina.  He describes a 2-3  weeks history of increased exertional angina with low-level activity.  His symptoms resolved with rest.  He had earlier classic presentation of  crescendo angina and was referred for cardiac catheterization in the  setting of known CAD and prior CABG.   Risks and indications of procedure were reviewed with the patient and  informed consent was obtained.  The right groin was prepped, draped, and  anesthetized with 1% lidocaine using modified Seldinger technique.  A 4-  French sheaths was placed in the right femoral artery.  Standard 4-  [place] Judkins catheters were used for coronary angiography, saphenous  vein graft angiography, LIMA angiography, and left ventricular  angiography.  All catheter exchanges were performed over a guidewire.  The patient tolerated the procedure well.  There were no immediate  complications.  A 3-D RCA catheter was used for the LIMA and native  right coronary artery.  A JL-4 catheter was used for the left coronary  artery and RCD catheter was used for the saphenous vein graft to RCA and  saphenous vein graft to ramus intermedius.  The 3-D RCA catheter was  also used for the saphenous vein graft to first diagonal.   FINDINGS:  Aortic pressure 134/57 with a mean of 89 and  left ventricular  pressure 141/14.   Aortic pressure 134/57 with a mean of 89 and left ventricular pressure  141/ 14.   Coronary angiography:  The left mainstem:  Calcified.  Proximal  midportions are widely patent.  There are severe distal left main  stenoses of 90%.  The left main divides into the LAD and left  circumflex.   LAD:  The LAD is heavily calcified.  The proximal vessel was completely  occluded.   Left circumflex:  There is a moderate-sized intermediate branch.  The  vessel has 90% stenosis at its origin.  There is competitive filling  from the graft.  Vessel was diffusely diseased prior to the graft.   Left circumflex.  The left circumflex is very small and diffusely  diseased.  There is a small second OM branch that has severe 90%  stenosis.   Right coronary artery:  The right coronary artery is severely diseased  throughout the proximal portion.  It is heavily calcified.  The mid  vessel was totally occluded.   Saphenous vein graft to first diagonal:  This is a small vein.  The vein  is  widely patent with no focal stenosis.  The native diagonal branch is  patent without significant disease.   Saphenous vein graft to distal right coronary artery.  This vein is  widely patent with no significant stenosis.  It appears to be  anastomosed at the origin of the PDA.  It is described as a sequential  graft, but I do not see the sequence portion.  The PDA and two  posterolateral branches fill.  There is some back filling of the distal  right coronary artery with filling of an acute marginal branch as well.   Saphenous vein graft to ramus intermedius:  Widely patent.  No  significant stenosis.  The intermediate is moderate-sized and there is  no significant disease.   LIMA to LAD:  The LIMA is widely patent throughout its course.  It is  anastomosed to the mid-LAD.  The mid distal LAD have no significant  stenosis.  There is a small RV marginal branch of the right  coronary  artery that fills from collaterals of the LIMA and LAD.   Left ventricular function is normal by left ventriculography.  The LVEF  is 60%.   ASSESSMENT:  1. Severe native three-vessel coronary artery disease.  2. Status post multivessel coronary bypass surgery with all grafts      patent.  3. Normal left ventricular systolic function with normal left      ventricular filling pressures.   PLAN:  [name] has no change in his coronary anatomy.  I am unsure  how to explain his symptoms.  In any event he was reassured regarding  his coronary status with widely patent bypass grafts, normal LV  function, and normal LV filling pressures.  His medical therapy will  continue.      Juanda Bond. Burt Knack, MD  Electronically Signed     MDC/MEDQ  D:  01/10/2008  T:  01/11/2008  Job:  [id number]   cc:   Kathryne Eriksson, MD  Wallis Bamberg Johnsie Cancel, MD, [facility]

## 2010-08-18 NOTE — Consult Note (Signed)
[name], [name]               ACCOUNT NO.:  [id number]   MEDICAL RECORD NO.:  [id number]          PATIENT TYPE:  EMS   LOCATION:  URG                          FACILITY:  [facility]   PHYSICIAN:  Ileene Hutchinson T. Erik Obey, M.D. DATE OF BIRTH:  1932/03/14   DATE OF CONSULTATION:  03/19/2008  DATE OF DISCHARGE:  03/19/2008                                 CONSULTATION   CHIEF COMPLAINT:  Nasal trauma.   HISTORY:  This is a 77 year old white male who stumbled approximately 3  hours ago striking the dorsum of his nose flat on the ground.  He also  struck his left knee and left hand.  He did not faint before the fall  but simply stumbled.  He did not lose consciousness afterwards.  He  sustained an external laceration and had some persistent bleeding from  the internal nose.  He has never had a prior nasal fracture.  The pain  level is moderate.  No vision difficulty.  No neck pain or radiating  neurologic symptoms.  No malocclusion or dental problems.   PHYSICAL EXAMINATION:  This is a tall, trim, older middle-aged white  male.  He has an irregular excoriation/laceration over the bony nasal  dorsum.  He has a very prominent bony dorsum, but it looks to be  nondepressed and not displaced to the left or to the right.  There is  minimal active bleeding externally.  Ears are clear.  Internal nose  shows a small amount of blood in the vault, but the septum is straight  and the airway is good on both sides.  Oral cavity is moist with teeth  in good repair.  Oropharynx shows a small amount of old blood.  I did  not examine nasopharynx or hypopharynx.  Neck without adenopathy.   X-RAYS:  I reviewed a nasal bone series showing small comminuted  fracture of the tips of the nasal bones with slight depression but no  obvious lateral displacement.   IMPRESSION:  Depressed chip fracture of the tips of the nasal bones.   PLAN:  I discussed this with the patient and his family.  I recommend an  ice pack x24  hours, elevation for 3-4 days, Tylenol No. 3 or similar for  pain, or possibly just plain ibuprofen.  For now, I would like him to  blow the clots out of his nose and spray Afrin spray, but he should not  do this more than 2 days running.  I will try to see him back in my  office in 4-5 days and review the anatomy and decide whether this needs  any further intervention.  I suspect it will not require any formal  repair.  He is taking aspirin and I would like him to stop this for 3-4  days as for its blood thinning properties and then resume.      Marikay Alar. Erik Obey, M.D.  Electronically Signed     KTW/MEDQ  D:  03/19/2008  T:  03/20/2008  Job:  [id number]

## 2010-08-18 NOTE — Assessment & Plan Note (Signed)
[place] OFFICE NOTE   NAME:[name], [name]                      MRN:          [id number]  DATE:01/24/2008                            DOB:          Apr 18, 1931    PRIMARY CARDIOLOGIST:  Dr. Johnsie [name].   PRIMARY CARE PHYSICIAN:  Dr. Silvio [name].   This is a 77 year old white male patient of Dr. Kyla [name] who has  coronary artery disease status post CABG in 2002.  He has had 2 months  of recurrent substernal chest pain.  Initially he was treated for an  upper respiratory infection and reflux.  He continued to have pain and  underwent cardiac catheterization on January 10, 2008.  This revealed  severe native three-vessel coronary disease but widely patent bypass  grafts, normal LV function and normal LV filling pressures.  Dr. Burt [name]  could not explain his symptoms based on his cardiac cath.   The patient has continued to complain of substernal chest pain.  He says  whenever he does anything he becomes short of breath and develops a  hurting in his chest.  As soon as he stops his activity, it goes away.  This also occurs when he lays down at night and is relieved with sitting  up in a recliner for a few minutes.  He saw Dr. Frederico [name] in Dr. Alla [name]  absence last week who ordered a CT angio of his chest.  This showed no  pulmonary embolus but patchy lower lobe ground glass opacity, most  confluent in the right lower lobe but sparing the right costophrenic  sulcus.  There was trace layering of right pleural effusion and consider  viral atypical infectious alveolitis.  Differentials includes  atelectasis, drug toxicity, hypersensitivity, pneumonitis or  interstitial disease.   The patient denies any cough, fever, chills.  He says this is unlike the  chest burning that he has had with his angina in the past.   CURRENT MEDICATIONS:  1. Aspirin 81 mg daily.  2. Multivitamin daily.  3. Isosorbide 60 mg daily.  4.  Simvastatin 40 mg daily.  5. Lisinopril 40 mg daily.  6. Hydrochlorothiazide 25 mg daily.  7. Protonix 40 mg daily.  8. Metoprolol 50 mg one-half b.i.d.  9. Prilosec 40 mg daily.   PHYSICAL EXAM:  This is a pleasant 77 year old white male in no acute  distress.  Blood pressure 134/79, pulse 64, weight 224.  NECK:  Without JVD, HJR, bruit or thyroid enlargement.  LUNGS:  Decreased breath sounds with crackles and rhonchi at the right  base of the lung.  HEART:  Regular rate and rhythm at 64 beats per minute, normal S1 and  S2, distant heart sounds, positive S4; no murmur, rub, bruit, thrill or  heave noted.  ABDOMEN:  Soft without organomegaly, masses, lesions or abnormal  tenderness.  RIGHT GROIN:  Without hematoma or hemorrhage.  He has good distal pulses.   EKG:  Normal sinus rhythm, left anterior fascicular block, no acute  change.   IMPRESSION:  1. Chest pain and dyspnea with abnormal CT angio of the chest, see  above dictation.  2. Coronary artery disease status post coronary artery bypass grafting      x5 in 2002 with a left internal mammary artery to the left anterior      descending, saphenous vein graft to the first diagonal, saphenous      vein graft to the obtuse marginal, and saphenous vein graft to the      posterior descending artery.  3. Cardiac cath on January 10, 2008:  Widely patent bypass grafts,      normal LV function, normal LV filling pressures.  4. Hypertension.  5. Hyperlipidemia.  6. History of gastroesophageal reflux disease.  7. Nephrolithiasis   PLAN:  I do not think the patient's pain and dyspnea is ischemic.  I  have referred him to our Pulmonary Specialist to follow up his symptoms  and abnormal CT angio. He will then see Dr. Johnsie [name] back in 1-2 months.      [name] Barrios, PA-C  Electronically Signed      [name]. [name] Perches, MD, [facility]  Electronically Signed   ML/MedQ  DD: 01/24/2008  DT: 01/24/2008  Job #: [id number]   cc:   [name] Carbon, MD

## 2010-08-18 NOTE — Assessment & Plan Note (Signed)
[place] OFFICE NOTE   NAME:[name], [name]                      MRN:          [id number]  DATE:01/09/2008                            DOB:          06/05/1931    REASON FOR VISIT:  Chest pain.   HISTORY OF PRESENT ILLNESS:  [name] is a 77 year old gentleman with  coronary artery disease and prior coronary bypass surgery who presents  for further evaluation of chest pain.  He was seen by Dr. Johnsie [name] last  month where he had predominantly atypical symptoms.  He has been treated  for gastroesophageal reflux, as well as upper respiratory tract  infection, and his symptoms have not improved.  Actually, over the last  few weeks, he has developed exertional chest discomfort.  He has typical  substernal chest pressure with activity.  He has had symptoms with  walking, and they resolve after several minutes of rest.  He has also  had a few episodes of nocturnal chest pain.  He has not taken  nitroglycerin to date.  He denies dyspnea, orthopnea, PND, palpitations,  lightheadedness or syncope.  He has not developed rest symptoms during  the daytime.  I was called by Dr. Edilia Bo this morning because of  concern over the nature of his symptoms and the fact that have not  improved in spite of therapy as described above.   MEDICATIONS:  1. Aspirin 81 mg daily.  2. Multivitamin daily.  3. Isosorbide 60 mg daily.  4. Simvastatin 40 mg daily.  5. Lisinopril 40 mg daily.  6. Hydrochlorothiazide 25 mg daily.  7. Protonix 40 mg daily.  8. Metoprolol 25 mg twice daily.   ALLERGIES:  No known drug allergies.   PAST MEDICAL HISTORY:  1. Coronary bypass in 2002.  The patient underwent five-vessel bypass      at that time after he presented with unstable angina.  He had      follow-up cardiac catheterization in 2006 that demonstrated patent      grafts.  I do not have a copy of the report, but the diagram shows      a  patent LIMA to the LAD, patent saphenous vein graft to the first      diagonal branch, patent saphenous vein graft to the OM, and patent      saphenous vein graft to right PDA.  Nuclear stress study in January      2009 showed inferobasal scar with no ischemia.  The LVEF was 51%.  2. Hypertension.  3. Hyperlipidemia.  4. Nephrolithiasis.  5. Gastroesophageal reflux disease.   REVIEW OF SYSTEMS:  A 12 point review of systems was performed.  There  were no pertinent positives to report except as listed in the HPI.   PHYSICAL EXAMINATION:  GENERAL:  The patient is alert and oriented.  He  is in no acute distress.  VITAL SIGNS:  Weight is 224 pounds, blood pressure 143/76, heart rate is  59, respiratory rate is 16.  HEENT:  Normal.  NECK:  Normal carotid upstrokes.  No bruits.  JVP  normal.  LUNGS:  Clear bilaterally.  HEART:  Regular rate and rhythm.  No murmurs or gallops.  ABDOMEN:  Soft, nontender.  No organomegaly.  No abdominal bruits.  EXTREMITIES:  No clubbing, cyanosis or edema.  Peripheral pulses intact  and equal.  SKIN:  Warm and dry with no rash.  LYMPHATICS:  No adenopathy.  NEUROLOGIC:  Cranial nerves II-XII are intact.  Strength is intact and  equal bilaterally.  BACK:  No CVA tenderness.   EKG shows normal sinus rhythm with left axis deviation.  This is  unchanged from previous.   ASSESSMENT:  This is a 77 year old gentlemanman with known coronary artery  disease and previous coronary artery bypass surgery who presents with  typical angina.  His symptoms are fairly compelling for ischemia.  I  have recommended a cardiac catheterization in the setting of known CAD  and typical symptoms.  Risks and indications of the procedure were  reviewed in detail with the patient, who agrees to proceed.  The patient  will be scheduled for the outpatient cath lab on January 10, 2008.  Further disposition pending the results of his cardiac catheterization.  He should continue his  current medical regimen for the time being.     [name] Bond. Burt Knack, MD  Electronically Signed    MDC/MedQ  DD: 01/09/2008  DT: 01/10/2008  Job #: [id number]   cc:   [name] Bamberg. Johnsie Cancel, MD, [facility]  [name] Eriksson, MD

## 2010-08-18 NOTE — Assessment & Plan Note (Signed)
[place] OFFICE NOTE   NAME:[name], [name]                      MRN:          [id number]  DATE:10/17/2006                            DOB:          May 15, 1931    [name] is seen today.  He has a history of coronary artery  bypass surgery.  As I recall, this was back in 2004.  He had a  nonischemic Myoview in January 2008.  His ejection fraction is low  normal at 50%.  He has been doing fairly well.  He gets occasional  exertional dyspnea.  There is a question of COPD or pulmonary fibrosis.  He has had some bronchitis in the past.  He sees Dr. Silvio Pate for this.  I  do not see formal PFTs in the chart.  He has not had any significant  chest pain.   In regards to his heart, there has been no PND or orthopnea.  No  palpitations or syncope.   REVIEW OF SYSTEMS:  Otherwise remarkable for a recent cyst removed from  his back.  He has been on antibiotics for this.  He thinks this is  upsetting his stomach.  He had some lab work drawn at Dr. Alla German  office this morning and wanted to make sure I got a copy of this.  Review of systems is otherwise negative.   MEDICATIONS:  1. Lopressor 25 b.i.d.  2. Lisinopril 10 a day.  3. Aspirin a day.  4. Multivitamin.  5. Imdur 60 a day.  6. Flomax 0.4 a day.  7. Simvastatin 40 a day.   PHYSICAL EXAMINATION:  GENERAL:  Remarkable for a healthy appearing,  elderly, white male in no distress.  Affect is appropriate.  VITAL SIGNS:  Respiratory rate is 14.  Blood pressure is 140/80.  Pulse  is 73 and regular. Weight is 212.  HEENT:  Normal.  Carotids are normal without bruits.  There is no  lymphadenopathy.  No thyromegaly.  No JVP elevation.  LUNGS:  Clear with good diaphragmatic motion.  No wheezing.  HEART:  There is an S1 S2 with a systolic ejection murmur.  PMI is  normal.  ABDOMEN:  Benign.  Bowel sounds positive.  There is no tenderness, no  organomegaly,  no hepatosplenomegaly, no hepatojugular reflux, no bruits,  and no AAA.  EXTREMITIES:  Distal pulses are intact with no edema.  PTs are +3.  There are no varicosities.  SKIN:  Remarkable for a recent excision of seborrheic keratosis over the  chest.  It seems to be healing well.  NEUROLOGIC:  Nonfocal.  There is no muscular weakness.   IMPRESSION:  1. Stable, status post coronary artery bypass graft in 2004,      nonischemic Myoview, followup Myoview January 2009.  Continue      current medical therapy including aspirin and beta-blocker.  2. Hypertension, currently fairly well controlled.  Continue low salt      diet and current medications.  3. Dyspnea, probably more related to lung problems.  Follow up with      Dr.  [name].  May need followup high resolution CT scan to further      elucidate possible pulmonary fibrosis.  No evidence of cardiac      problems relating to dyspnea.  4. Recent right knee arthroscopy currently healing.  Increase      ambulation and physical therapy occupational therapy per      orthopedics.  5. Hypercholesterolemia.  Continue Simvastatin, particularly in the      setting of coronary disease.  Followup lipid and liver profiles      done this morning.  We will see what these show.  He certainly      could be on a higher dose if his LDL is above 80.   I will see him back in January, when he has his stress test.     Collier Salina C. Johnsie Cancel, MD, [facility]  Electronically Signed    PCN/MedQ  DD: 10/17/2006  DT: 10/17/2006  Job #: [id number]

## 2010-08-18 NOTE — Assessment & Plan Note (Signed)
[place] OFFICE NOTE   NAME:Lamison, THERRON SCHORSCH                      MRN:          [id number]  DATE:10/30/2007                            DOB:          28-Sep-1931    HISTORY OF PRESENT ILLNESS:  Mr. Villaruz returns today for followup.  He  is status post previous CABG with good LV function.  He has hypertension  and hypercholesterolemia.  He is not having any significant chest pain.   He has good LV function.  His last Myoview on April 17, 2007 was  nonischemic with an EF of 51% and inferobasal wall scar.   The patient had CABG x5 in 2002.  His grafts were patent by cath in  2006.  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  It seems to be running high.  It was high  in the office today.  I talked to him a bit about low-salt diet.  We  will increase his lisinopril from 10 to 20 mg a day.   REVIEW OF SYSTEMS:  Otherwise, on review of systems, he is not having  palpitations, PND, orthopnea.  There has been no heart failure or  angina.   He has been compliant with his medications.   CURRENT MEDICATIONS:  1. Lisinopril, which will be increased to 20 a day.  2. An aspirin a day.  3. Multivitamins.  4. Imdur 60 a day.  5. Simvastatin 40 a day.  6. Prilosec 20 a day.  7. He uses [facility] on the [address].  He also takes Toprol 50 mg b.i.d.   PHYSICAL EXAMINATION:  VITAL SIGNS:  Remarkable for blood pressure of  165/86, pulse 54 and regular, respiratory rate 14, afebrile, and weight  is 227.  HEENT:  Unremarkable.  NECK:  No carotid bruits.  No lymphadenopathy, thyromegaly, or JVP  elevation.  LUNGS:  Clear.  Good diaphragmatic motion.  No wheezing.  S1 and S2 with  normal heart sounds.  PMI normal.  ABDOMEN:  Benign.  Bowel sounds positive.  No AAA, no tenderness, no  bruit, no hepatosplenomegaly, no hepatojugular reflux.  EXTREMITIES:  Distal pulses are  intact, no edema.  NEURO:  Nonfocal.  SKIN:  Warm and dry.  No muscular weakness.   EKG today shows sinus rhythm with left axis deviation, PVC.   IMPRESSION:  1. Coronary artery disease, previous coronary artery bypass graft, no      angina.  Continue aspirin and beta-blocker.  2. Hypertension.  Increase lisinopril from 10 to 20 mg a day.      Followup in 3 months.  Record blood pressures at home twice a week.  3. Hypercholesterolemia.  Continue simvastatin.  We will check to see      when his last lipid and liver was checked by Dr. Linda Hedges.  4. History of prostatism, previously on Flomax, currently doing fine.      Followup PSA in 6 months.     Wallis Bamberg. Johnsie Cancel, MD, [facility]  Electronically Signed    PCN/MedQ  DD: 10/30/2007  DT: 10/30/2007  Job #: [id number]

## 2010-08-18 NOTE — Assessment & Plan Note (Signed)
[place] OFFICE NOTE   NAME:Mataya, CHANTHA MURAD                      MRN:          [id number]  DATE:04/25/2007                            DOB:          January 16, 1932    Mr. Mohamud seen today in follow-up.  Has a previous history coronary  bypass surgery.  Last cath in January 2008 showed patent grafts.  He is  not a recurrent chest pain.   The patient has good LV function.   He has been sedentary over the winter.  He has gained about 10 pounds.  His LDL cholesterol was 98.  His hemoglobin A1c was 6.8.  His sugars  have been running high.   I explained to him the connection between weight gain insulin  resistance.  He has been compliant with his other meds.  He has not  really been walking.  He has not had any PND, orthopnea, no palpitations  or syncope.   REVIEW OF SYSTEMS:  Otherwise negative.   CURRENT MEDICATIONS:  Include Lopressor 25 b.i.d., lisinopril 10 and an  aspirin a day, multivitamins, isosorbide 60 a day, simvastatin to be  increased to 80 a day.   EXAM:  Remarkable for an elderly white male in no distress.  His weight is up to 222 from 212 in July, respiratory 14, blood pressure  140/80, pulse 60 and regular, afebrile.  HEENT:  Unremarkable.  Carotids are without bruit, no lymphadenopathy, thyromegaly, JVP  elevation.  LUNGS:  Clear diaphragmatic motion.  No wheezing.  [id number] with a systolic ejection murmur.  PMI normal.  No diastolic  murmur.  ABDOMEN:  Benign.  Bowel sounds positive.  No AAA no tenderness.  No  hepatosplenomegaly.  No hepatojugular reflux.  Pulse intact, no edema.  NEURO:  Nonfocal.  SKIN:  Warm and dry.  No muscular weakness.   I reviewed lab work from Dr. Silvio Pate that was done I believe earlier this  month.  His LDL was 98.  LFTs were normal.  Hematocrit was 44.7 I did  not see a PSA.  TSH was 0.59, creatinine was 0.9.   IMPRESSION:  1. Coronary disease, previous CABG.   Grafts patent recent Myoview done      April 17, 2007 with inferior lateral wall scar no ischemia.      Continue aspirin and beta blocker therapy.  2. Hypertension currently well controlled.  Continue current dose of      ACE inhibitor low-salt diet.  3. Weight gain.  Increase activity decreased caloric intake. No      contraindications to exercise in regards to his heart.  4. Hypercholesterolemia.  Increase simvastatin from 40 to 80.  Lipid      and liver profile in 3 months current goal LDL 70 or less.  5. Diabetes follow-up with Dr. Silvio Pate.  Target goal hemoglobin A1c low      sixes, needs dietary consult.   The patient may benefit from the addition of Amaryl or Glucophage.     Wallis Bamberg. Johnsie Cancel, MD, [facility]  Electronically Signed    PCN/MedQ  DD: 04/25/2007  DT: 04/25/2007  Job #: [id number]

## 2010-08-18 NOTE — Assessment & Plan Note (Signed)
[place] OFFICE NOTE   NAME:[name], [name]                      MRN:          [id number]  DATE:12/22/2007                            DOB:          1931/07/15    [name] returns today for followup.  He has not felt well recently.  Apparently, he saw Wyatt [facility].  He  has been having some atypical chest pain.  He had a Myoview in January,  which was nonischemic.  His pain is atypical with sharp pain in his  chest.  It radiate up towards the shoulders.  It is necessarily  exertional.  He says it hurts him to take a deep breath.  He sounds like  he has some reflux.  There has been no nausea and vomiting.   He has a bit of a dry cough.  He has not been febrile   I had to pull his results from Tuesday as no one had gone over them with  him yet.  His chest x-ray showed slight cardiomegaly post CABG with no  cardiopulmonary disease and no infiltrate.  His LDL cholesterol was 96  with normal LFTs.  His white count was 7.2, hematocrit was 44.  His BUN  was 23 with a creatinine of 1.0.  A1c was 7.2.  In talking to Ikner,  his dyspnea seems little bit functional.  He may have a bit of a URI  with his cough, but clearly does not have a bacterial pneumonia or heart  failure.  He has good LV function.  He has not had lower extremity  edema.   His review of systems is otherwise negative.   He is taking aspirin a day, multivitamins, Imdur 60 a day, lisinopril 40  a day, hydrochlorothiazide 25 a day, Protonix 40 a day, Lopressor 25  b.i.d., he is on Flomax 0.4 a day, blood pressure control is much  improved on higher dose lisinopril.   PHYSICAL EXAMINATION:  VITAL SIGNS:  Blood pressure was 140/80, pulse 69  and regular, respiratory rate 14 and afebrile, and weight is 224.  HEENT:  Unremarkable.  NECK:  Carotids are normal without bruit.  No lymphadenopathy,  thyromegaly, or JVP  elevation.  LUNGS:  Clear.  Good diaphragmatic  motion.  No wheezing.  CARDIAC:  S1 and S2 with a systolic ejection murmur.  ABDOMEN:  Benign.  Bowel sounds positive.  No AAA, no tenderness, no  bruit, no hepatosplenomegaly, or hepatojugular reflux.  EXTREMITIES:  No tenderness.  Distal pulses are intact.  No edema.  NEURO:  Nonfocal.  SKIN:  Warm and dry.  MUSCULOSKELETAL:  No muscular weakness.   As indicated, his chest x-ray and all of his lab work were reviewed.  His EKG shows sinus rhythm with left axis deviation, no acute changes.   IMPRESSION:  1. Chest pain atypical history of coronary artery bypass grafting,      nonischemic Myoview in January.  Continue aspirin and beta-blocker.      No indication for catheterization.  2. Dyspnea, may be related to upper  respiratory infection.  No      evidence of pneumonia.  White count normal, afebrile.  No evidence      of congestive heart failure with weight actually being down.      Continue current dose of diuretic.  Follow up with Billie Bean.  3. Hypertension, improved control on higher dose lisinopril.  Continue      current medications.  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  4. Hypercholesterolemia, currently in a reasonable range in the 90s on      simvastatin 40 a day.  Given his known coronary disease we can      consider switching to higher dose Crestor in the future.  5. Question reflux.  Continue Protonix, this may be contributing to      his cough.  He will follow up with Dalene Seltzer Bean being regarding this      as well.  Overall, I think his heart is stable.  I will see him      back in 58-month.     Wallis Bamberg. Johnsie Cancel, MD, [facility]  Electronically Signed    PCN/MedQ  DD: 12/22/2007  DT: 12/23/2007  Job #: [id number]

## 2010-08-21 NOTE — Discharge Summary (Signed)
NAMEKELYNN, STAVELY               ACCOUNT NO.:  [id number]   MEDICAL RECORD NO.:  [id number]          PATIENT TYPE:  INP   LOCATION:  6523                         FACILITY:  [facility]   PHYSICIAN:  [name], M.D.     DATE OF BIRTH:  02-14-1932   DATE OF ADMISSION:  08/17/2004  DATE OF DISCHARGE:  08/20/2004                                 DISCHARGE SUMMARY   PRINCIPAL DIAGNOSIS:  Chest pain.   OTHER DIAGNOSES:  1.  Coronary artery disease.  2.  Hypertension.  3.  Gastroesophageal reflux disease.  4.  Mild aortic sclerosis.  5.  Hyperlipidemia.   ALLERGIES:  NO KNOWN DRUG ALLERGIES.   PROCEDURE:  Left heart cardiac catheterization.   HISTORY OF PRESENT ILLNESS:  A 78 year old white male with prior history of  CAD, status post CABG x2 in 2002 with hypertension, hyperlipidemia, and mild  aortic sclerosis who presented to [facility] on Aug 17, 2004 with  one-week history of substernal chest pain on exertion associated with  shortness of breath and relieved by rest.  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 Silvio Pate, and he was referred to the [facility] ED.  The  patient ruled out for MI and he was admitted for further evaluation.   HOSPITAL COURSE:  The patient was scheduled for left heart cardiac  catheterization which took place on May 15, revealing patent bypass grafts  with small vessel disease that would not be amenable to PCI.  It was  determined at that point that the patient should continue with medical  management.  He was initiated on long-acting nitrate therapy and he has not  had any recurrent chest pain.  He has been ambulating in the hallway without  difficulty and is being discharged home today in satisfactory condition.   DISCHARGE LABORATORY DATA:  Hemoglobin 13.6, hematocrit 39.6, WBC 7.3,  platelets 194,000.  Sodium 135, potassium 3.6, chloride 104, bicarbonate  25,  BUN 22, creatinine 0.9, glucose 116.  Cardiac enzymes negative x3.  Total  cholesterol 137, triglycerides 82, HDL 36, LDL 85.  Total bilirubin 0.5,  alkaline phosphatase 45, AST 19, ALT 18, calcium 9.3, magnesium 2.1.  TSH  1.588.   DISCHARGE PHYSICAL EXAMINATION:  VITAL SIGNS:  Temperature 96.9, heart rate  56, respirations 18, blood pressure 110/50, pulse oximetry 97% on room air.  GENERAL:  A pleasant white male in no acute distress, alert and oriented x3.  NECK:  Normal carotid upstrokes, no bruits or JVD.  LUNGS:  Respirations regular and unlabored, clear to auscultation.  CARDIAC:  Regular S1 and S2, no S3 or S4 or murmur.  ABDOMEN:  Round, soft, nontender, non-distended, bowel sounds present x4.  EXTREMITIES:  Warm and dry and pink, no clubbing, cyanosis or edema.  Dorsalis pedis and posterior tibial pulses 2+ and equal bilaterally.  The  groin site used for catheterization without bleeding, bruit or hematoma.   DISPOSITION:  The patient is being discharged home in good condition and  he  was asked to follow up with his primary care physician, Dr. Silvio Pate, in one  to two weeks and he has an appointment with Dr. Johnsie Cancel on September 03, 2004 at  10:30 a.m.   DISCHARGE MEDICATIONS:  1.  Lopressor 25 mg b.i.d.  2.  Aspirin 81 mg daily.  3.  __________  40 mg daily.  4.  Lisinopril/HCTZ 25/12.5 mg two tablets daily.  5.  Potassium chloride 10 mEq daily.  6.  Imdur 30 mg daily.  7.  Zantac 150 mg daily.  8.  Nitroglycerin 0.4 mg p.r.n. chest pain.   Pending lab studies:  None.      CRB/MEDQ  D:  08/20/2004  T:  08/20/2004  Job:  [id number]

## 2010-08-21 NOTE — Discharge Summary (Signed)
[place]  Patient:    [name], [name] Visit Number: [id number] MRN: [id number]          Service Type: MED Location: 2000 2009 01 Attending Physician:  Modesto Charon Dictated by:   Lynnell Catalan, P.A. Admit Date:  11/16/2000 Discharge Date: 11/21/2000   CC:         [name] D. Lia Foyer, M.D. [facility]   Discharge Summary  DATE OF BIRTH:  07/20/1931  ADMISSION DIAGNOSIS:  Unstable angina.  SECONDARY DIAGNOSIS:  Hypertension.  DISCHARGE DIAGNOSIS:  Coronary artery disease.  PROCEDURES: 1. Cardiac catheterization. 2. Pre-CABG Dopplers. 3. Bilateral carotid duplex. 4. Coronary artery bypass graft x 5.  HOSPITAL COURSE:  [name] was admitted to [facility] on November 16, 2000, secondary to unstable angina.  Because of this the patient underwent cardiovascular workup which included cardiac catheterization.  This revealed the patient had significant coronary artery disease.  Because of this, Dr. Roxan Hockey was consulted.  On November 17, 2000, the patient underwent a coronary artery bypass graft x 5.  The left internal mammary artery was anastomosed to the left anterior descending artery, saphenous vein graft to the second diagonal artery, saphenous vein graft to the obtuse marginal 1, and sequential saphenous vein graft anastomosed to the distal right coronary artery and distal posterior descending artery.  No complications were noted during the procedure.  Postoperatively the patient had an uneventful hospital course.  He did, however, have several periods of low-grade temperature.  This was felt to be due to atelectasis, and the patient was instructed on aggressive use of incentive spirometer.  The patient was subsequently discharged home in stable condition on postoperative day #4, November 21, 2000.  DISCHARGE MEDICATIONS: 1. Aspirin 325 mg 1 tablet daily. 2. Percocet 1-2 tablets every four to six hours as needed for  pain. 3. Lopressor 50 mg 1/2 tablet every 12 hours. 4. Lasix 40 mg 1 tablet daily. 5. Potassium chloride 20 mEq 1 tablet daily. 6. Zantac 75 mg 1 tablet twice daily.  ACTIVITY:  The patient was told to avoid driving, strenuous activity, and lifting heavy objects.  He was told to walk daily and continue using his incentive spirometer daily.  DIET:  Low fat, low salt.  WOUND CARE:  The patient was told that he could shower and clean his incisions with soap and water.  DISPOSITION:  Home.  FOLLOW-UP:  The patient was instructed to follow up with Dr. Lia Foyer on December 05, 2000, at 10:30 a.m.  He was also told to follow up with Dr. Roxan Hockey in approximately three weeks, and the office will call him to verify time and date of his appointment. Dictated by:   Lynnell Catalan, P.A.  Attending Physician:  Modesto Charon DD:  12/06/00 TD:  12/06/00 Job: [id number]

## 2010-08-21 NOTE — Cardiovascular Report (Signed)
NAMEADDAI, AFSHARI               ACCOUNT NO.:  [id number]   MEDICAL RECORD NO.:  [id number]          PATIENT TYPE:  INP   LOCATION:  6523                         FACILITY:  [place]   PHYSICIAN:  Junious Silk, M.D. LHCDATE OF BIRTH:  Oct 25, 1931   DATE OF PROCEDURE:  08/19/2004  DATE OF DISCHARGE:                              CARDIAC CATHETERIZATION   PROCEDURE PERFORMED:  Left heart catheterization with coronary angiography,  bypass graft angiography, and left ventriculography.   INDICATIONS:  The patient is 77 year old male with a history of three-vessel  coronary disease status post coronary artery bypass surgery. He presented to  the hospital with symptoms of progressive exertional chest pain. He ruled  out for myocardial infarction and was referred for cardiac catheterization.   PROCEDURE NOTE:  A 6-French sheath was placed in the right femoral artery.  Native coronaries were imaged with 6-French JL-4 and JR-4 catheters. The  saphenous vein graft to the diagonal branch was imaged with a JR-4 catheter.  The saphenous vein graft to the obtuse marginal was imaged with a LCB  catheter. The saphenous vein graft to the posterior descending artery was  imaged with an RCB catheter. The left internal mammary artery was imaged  with an internal mammary catheter. Left ventriculography was performed with  an angled pigtail catheter. Contrast was Omnipaque. At the conclusion of  procedure, a StarClose vascular closure device was placed in the right  femoral artery with good hemostasis. Of note, after we completed the  procedure the patient did have substernal chest pain which was relieved with  two sublingual nitroglycerin. There were no complications.   RESULTS:  HEMODYNAMICS:  Left ventricular pressure 168/18, aortic pressure  160/80. There was no aortic valve gradient.   Left ventriculogram:  Wall motion is normal, ejection fraction estimated at  60%. There is no mitral  regurgitation.   CORONARY ARTERIOGRAPHY:  Left main has a distal 90% stenosis.   Left anterior descending artery has a diffuse 60% stenosis in the proximal  vessel extending into the origin of the second diagonal branch. Beyond the  second diagonal, the LAD is 100% occluded. The distal LAD fills via left  internal mammary graft. There is also what appears to be a small first  diagonal branch which is occluded proximally. This fills via saphenous vein  graft.   Left circumflex has a diffuse 50% stenosis in the mid vessel. There is a  large second obtuse marginal which fills via saphenous vein graft. There is  a small first obtuse marginal and small third and fourth obtuse marginal  branches arising from the native circumflex.   Right coronary is a dominant vessel. There is a diffuse 80% stenosis in the  proximal mid vessel followed by a 99% stenosis in mid vessel just prior to  an acute marginal branch. Beyond the acute marginal branch, the distal right  coronary is 100% occluded. The distal right coronary fills via saphenous  vein graft.   Left internal mammary artery to the distal LAD is patent throughout its  course filling the mid and distal LAD. There is moderate diffuse  disease in  the apical LAD.   Saphenous vein graft to the first diagonal branch is patent throughout its  course filling a small diagonal branch.   Saphenous vein graft to the second obtuse marginal branch is patent  throughout its course filling a large second obtuse marginal branch.   Saphenous vein graft to the posterior descending artery is patent throughout  its course filling a large posterior descending artery and normal-sized  first posterior branch and a small second posterior branch.   IMPRESSION:  1.  Normal left ventricular systolic function.  2.  Native three-vessel coronary artery disease.  3.  Status post coronary artery bypass surgery. All grafts are patent. The      patient does appear to  be well revascularized.  4.  There is residual small vessel disease in nonbypassed vessels,      particularly in the second diagonal branch and in the acute marginal      branch. However, this disease appears to be chronic in nature and not      favorable for percutaneous coronary intervention.   RECOMMENDATIONS:  Medical therapy.      MWP/MEDQ  D:  08/19/2004  T:  08/19/2004  Job:  [id number]   cc:   [name], M.D.

## 2010-08-21 NOTE — Op Note (Signed)
[place]. [facility]  Patient:    ERSEL, KLEEMAN                MRN: [id number] Proc. Date: 11/15/00 Adm. Date:  [id number] Attending:  Modesto Charon CC:         Wallis Bamberg. Johnsie Cancel, M.D. [facility]  Heinz Knuckles. Norins, M.D. 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  PROCEDURE:  Median sternotomy, extracorporeal circulation, coronary artery bypass grafting x 5 (left internal mammary artery to left anterior descending, saphenous vein graft to second diagonal, saphenous vein graft to obtuse marginal-one, sequential saphenous vein graft to distal right coronary and distal posterior descending).  SURGEON:  Revonda Standard. Roxan Hockey, M.D.  ASSISTANT:  Earnstine Regal, P.A.  ANESTHESIA:  General.  FINDINGS:  Good quality targets, good quality conduits.  First diagonal was too small and heavily diseased to graft.  Preserved left ventricular function. Weaned from bypass without difficulty.  CLINICAL NOTE:  Mr. Riedman is a 77 year old gentleman, who presented with recent onset of class 3 angina.  He had initially presented with complaints of right upper quadrant pain, but on further questioning was having exertional chest tightness and shortness of breath.  He underwent cardiac catheterization, which revealed a 60% left main stenosis, as well as diffuse 3-vessel coronary disease.  He was noted to have calcification in his aortic valve, but no gradient at the time of catheterization.  He underwent a 2-D echocardiogram, which showed some aortic valve sclerosis, but an estimated 7 mm gradient with a valve area greater than 2 cm.sq.  The patient was referred for coronary artery bypass grafting.  The indications, risks, benefits, and alternatives for bypass grafting were discussed in detail, as well as the  decision making process regarding the aortic valve.  There was no indication for aortic valve replacement at this time.  Mr. Paccione understood and accepted the risks and agreed with the operative plan.  OPERATIVE NOTE:  Mr. Schwiebert was brought to the preoperative holding area on November 17, 2000.  Lines were placed to monitor arterial, central venous and pulmonary arterial pressure.  EKG leads were placed for continuous telemetry. The patient was taken to the operating room, anesthetized and intubated.  A Foley catheter was placed, intravenous antibiotics were administered.  The chest, abdomen and legs were prepped and draped in the usual fashion.  A median sternotomy was performed and the left internal mammary artery was harvested in the standard fashion.  It was a 2 mm good quality vessel.  The patient was fully heparinized prior dividing the distal end of the mammary artery.  There was excellent flow through the cut end of the vessel. Simultaneously, an incision was made in the medial aspect of the right leg and the greater saphenous vein was harvested from the ankle to the lower thigh. The saphenous vein was also of good quality.  The pericardium was opened.  The ascending aorta was inspected and palpated. There was no palpable atherosclerotic disease.  The aorta was cannulated via concentric 2-0 Ethibond pledgeted purse-string sutures.  A dual-stage venous cannula was placed via purse-string suture in the right atrial appendage. Cardiopulmonary bypass was instituted and the patient was cooled to 32 degrees Celcius.  The coronary arteries were inspected and anastomotic sites were chosen.  The conduits were inspected and cut to length.  A foam pad  was placed in the pericardium to protect the left phrenic nerve.  A temperature probe was placed in the myocardial septum and a cardioplegia cannula was placed in the ascending aorta.  The aorta was cross-clamped.  The left ventricle was  emptied via the aortic root vent.  Cardiac arrest then was achieved with a combination of cold antegrade blood cardioplegia and topical ice saline.  After achieving a complete diastolic arrest and a myocardial septal temperature of 12 degrees Celcius, the following distal anastomoses were performed.  First, a reverse saphenous vein graft was placed sequentially to the distal right coronary and distal posterior descending.  He had severe heavily and diffusely diseased right coronary in its proximal and mid portions.  An arteriotomy was made in the distal right coronary just beyond the takeoff of the posterior descending.  The right coronary was a 2.5 mm, good quality vessel at this site.  A side-to-side anastomosis was performed off a side branch of the vein graft to the distal right coronary using a running 7-0 Prolene suture.  There was good flow through this anastomosis.  The distal end of the vein was cut to length and was anastomosed end-to-side to the distal posterior descending, which was a 1.5 mm fair quality vessel.  The posterior descending had a 50% stenosis in its mid portion and the graft was placed just beyond that stenosis.  At the completion of the second anastomosis, cardioplegia was administered.  There was excellent flow through the graft and there was good hemostasis at the anastomoses.  There was some bleeding from epicardial veins, which was controlled with clips.  Next, a reverse saphenous vein graft was placed end-to-side to the obtuse marginal-one.  This was a 1.5 mm, good quality target.  The vein graft was of smaller caliber, but still good quality relative to the other two veins.  The obtuse marginal was superficially intramyocardial, but was deeper intramyocardial distal to the anastomosis.  The anastomosis was performed with a running 7-0 Prolene suture in an end-to-side fashion.  At the completion of the anastomosis, cardioplegia was administered.  There was  good hemostasis and good flow.   Next, a reverse saphenous vein graft was placed end-to-side to the second diagonal branch of the LAD.  The diagonal one and two arose as a common trunk, which bifurcated.  The first diagonal was very diffusely diseased and small and not graftable.  The second diagonal also was diffusely diseased in its proximal segment and then it was a good quality vessel distally.  It was 1.5 mm at the site of the anastomosis.  Again, this anastomosis was performed with a running 7-0 Prolene suture in an end-to-side fashion.  The vein graft was of good quality.  There was excellent flow through the graft, cardioplegia was administered and there was good hemostasis at the anastomosis.  Next, the left internal mammary artery was brought through a window in the pericardium anterior to the left phrenic nerve.  The distal end of the mammary artery was spatulated.  It was anastomosed end-to-side to the distal LAD.  The LAD was 1.5 mm, good quality target.  The mammary was a 2 mm, good quality conduit.  The anastomosis was performed with a running 8-0 Prolene suture.  At the completion of the mammary to LAD anastomosis, the bulldog clamp was briefly removed from the mammary artery to inspect for hemostasis.  Rapid septal rewarming occurred.  The bulldog clamp was replaced and additional cardioplegia was administered.  The mammary  pedicle was tacked to the epicardial surface of the heart with 6-0 Prolene sutures.  Rewarming was begun.  The vein grafts were cut to length, the cardioplegia cannula was removed from the ascending aorta, the proximal vein graft anastomoses then were performed to 4.0 mm punch aortotomies with running 6-0 Prolene sutures.  As the final anastomosis was being completed, the bulldog clamp was once again removed from the mammary artery.  Lidocaine was administered and immediate and rapid septal rewarming was noted.  Deairing maneuvers were performed.   The patient was placed in Trendelenburg position and the aortic cross-clamp was removed.  Air was then aspirated from the vein grafts, the bulldog clamps were removed.  All proximal and distal anastomoses were inspected for hemostasis.  Epicardial pacing wires were placed on the right ventricle and right atrium.  When the core temperature reached 37 degrees Celcius, the patient was weaned from cardiopulmonary bypass.  He was atrially paced for rate on no inotropic support at the time of separation from bypass.  The total cross-clamp time was 82 minutes and the total bypass time 132 minutes.  A test dose of protamine was administered and was well-tolerated.  The atrial and aortic cannulae were removed.  The remainder of the protamine was administered without incident.  The chest was irrigated with 1 L of warm normal saline containing 1 g of vancomycin.  Hemostasis was achieved.  A left pleural and two mediastinal chest tubes were placed through separate subcostal incisions.  The pericardium was reapproximated over the ascending aorta and proximal vein graft anastomoses.  It could not be closed over the heart.  The sternum was closed with heavy gauge interrupted stainless steel wires.  The pectoralis fascia was closed with a running #1 Vicryl suture.  The subcutaneous tissue was closed a running 2-0 Vicryl suture and the skin was closed with a 3-0 Vicryl subcuticular suture.  All sponge, needle and instrument counts were correct at the end of the procedure.  The patient remained hemodynamically stable throughout the postbypass period and was taken from the operating room to the surgical intensive care unit intubated and in stable condition. DD:  11/17/00 TD:  11/17/00 Job: [id number]

## 2010-08-21 NOTE — Op Note (Signed)
NAME:  [name], [name]                   ACCOUNT NO.:  [id number]   MEDICAL RECORD NO.:  [id number]                   PATIENT TYPE:  AMB   LOCATION:  [place]                                  FACILITY:  [facility]   PHYSICIAN:  John L. Rendall III, M.D.           DATE OF BIRTH:  [birth year]   DATE OF PROCEDURE:  02/25/2003  DATE OF DISCHARGE:                                 OPERATIVE REPORT   CONTINUATION:   PREOPERATIVE DIAGNOSES:   POSTOPERATIVE DIAGNOSES:  1. Partial thickness cuff tear with impingement syndrome.  2. Degenerative tear of the glenoid labrum.  3. Partial tear of the biceps tendon.  4. Adhesive capsulitis.   OPERATIVE PROCEDURE:   SURGEON:  John L. Rendall, M.D.   ASSISTANT:  Vonita Moss. Duffy, P.A.-C.   ANESTHESIA:  General.   PATHOLOGY:  The patient was found to have significant fraying of about 1-1/2  inches of the long head of the biceps tendon within the glenohumeral joint.  From the inside, the rotator cuff appeared intact, but the glenoid labrum  had a degenerative tear of the anterior labrum.  The humeral head had no  significant arthritic change.  There was further labral deterioration  posteriorly inferiorly.  The glenohumeral joint was otherwise pretty much  intact.  Attention at the bursa revealed a downward sloping acromion, very  thick bursa, and spurs near the [facility] joint anteriorly.   DESCRIPTION OF PROCEDURE:  Under general anesthesia, the patient was placed  in the left lateral decubitus position with the bean bag.  The arm was  suspended with a 15 pound weight, with a fishing pole shoulder holder.  The  posterior entry was made after a routine prep and drape, marking out the  landmarks with a marking pen.  By Wissinger rod technique, an anterior  working portal was obtained, and the glenohumeral joint was entered.  The  labrum was debrided.  The frayed portion of the long head of the biceps was  debrided.  The remaining fraying was felt to be  minimal enough that the  tendon was not fully released or cut loose., feeling that what we had done  was sufficient at this point.  The inferior posterior labrum was then  debrided, and attention was then turned to the bursa.  A bursectomy was  carried out.  The [employer] wand was then used for periosteal debridement on  the under-surface of the acromion.  A 6 mm bur was used to take down the  downward slope and spurs close to the [facility] joint anteriorly.  Once this was  completed, a fine rasp was further used to smooth the rough edges.  Traction  was let off, and the decompression appeared appropriate.  It should be noted  that the rotator cuff was frayed on the outside, but no evidence of close to  full thickness tear was found.  The portals were then closed with #4-0  nylon, and the shoulder  was  then manipulated, having some fairly significant adhesive capsulitis.  Releasing to a full forward flexion about 160 degrees is all it would  tolerate.  __________ were also improved.   The patient returned to the recovery room in good condition.                                               John L. Wynona Luna, M.D.    Judie Grieve  D:  02/25/2003  T:  02/25/2003  Job:  [id number]

## 2010-08-21 NOTE — Assessment & Plan Note (Signed)
[place] OFFICE NOTE   NAME:[name]                      MRN:          [id number]  DATE:04/19/2006                            DOB:          04/05/1932    [name] is referred back here today for an episode of chest pain requiring  an ER visit.  This was on January 4.   The patient had coronary artery bypass surgery back, I believe, in 2002.  He had repeat heart catheterization in May 2006 and had a patent LIMA to  the LAD, patent vein graft to the diagonal, patent vein graft to the OM,  and a patent vein graft to the PDA.  He had normal LV function.  The  patient's chest pain was somewhat atypical.  He had a lot of burping and  belching.  He had a little bit of diaphoresis.   The pain occurred after he had some sausage gravy.  It lasted about 2  hours.  He did not take nitroglycerin for it.  It resolved when he went  to the emergency room.  His EKG was without change and his CPKs were  negative.   On review of systems, the patient has some mild chronic shortness of  breath.  He had an abnormal CPX and there was a suggestion of developing  pulmonary fibrosis.  The shortness of breath has not been thought due to  LV dysfunction or other cardiac problems.   He has a longstanding heart murmur and has had mild AS by previous  echocardiogram.   His medications include:  1. Lopressor 25 b.i.d.  2. Lisinopril 10 a day.  3. An aspirin a day.  4. Zantac 150 a day.  5. Isosorbide 60 a day.  6. Flomax 0.4 a day.  7. Simvastatin 40 a day.   His exam is remarkable for a blood pressure of 150/80, pulse 60 and  regular.  HEENT is normal.  Carotids have no bruit, no thyromegaly.  The  lungs have inspiratory crackles at the base.  There is an S1, S2 with a  mild AS murmur.  Abdomen is benign, lower extremities intact pulses and  no edema.  Neurologic is nonfocal.   Since I last saw the patient he also had a  cholecystectomy and has a new  scar in the right upper quadrant.   IMPRESSION:  [name] presents multiple issues in regards to his heart.  He  has had some recurrent chest pain.  The chest pain syndrome has some GI  overtones.  He just had his gallbladder removed and may be having some  reflux.  He is already on his Zantac.   He had patent grafts by catheterization May 2006.  We will refer him for  a stress Myoview study.   In regards to his murmur, he has had mild aortic stenosis.  He has not  had a recent echocardiogram.  He needs a followup for this.  His second  heart sound is preserved and I doubt that he has progressed to moderate  AS.   In regards  to the patient's hypertension, he had some problems with  postural hypotension and dizziness in the past.  However, his blood  pressure started to creep up and we reinstituted lisinopril.  I asked  him to watch his blood pressure over the next 3-4 weeks at home.  We  will continue his lisinopril.   His last LDL cholesterol, I believe, was 81 and we will continue his  simvastatin for hypercholesterolemia.   The patient asked me if he could have Cialis and Viagra.  I told [name]  that he could not.  He is status post CABG and is on long-acting  nitrates and therefore I told him he was not a candidate for this.   I will see him back in 6 months so long as his Myoview and  echocardiogram do not show critical disease.     Wallis Bamberg. Johnsie Cancel, MD, [facility]  Electronically Signed    PCN/MedQ  DD: 04/19/2006  DT: 04/19/2006  Job #: [id number]

## 2010-08-21 NOTE — Cardiovascular Report (Signed)
[place]. [facility]  Patient:    [name], [name]                MRN: [id number] Proc. Date: 11/16/00 Adm. Date:  [id number] Attending:  Wadie Lessen CC:         [place] Norins, M.D. [facility]  Peter C. Johnsie Cancel, M.D. [facility]  CV Laboratory  Margarita Sermons, P.A.C. [facility]   Cardiac Catheterization  INDICATIONS:  This 78 year old gentleman has classic exertional angina. He is brought in for further evaluation.  PROCEDURES: 1. Left heart catheterization. 2. Selective coronary arteriography. 3. Selective left ventriculography. 4. Aortic root angiography.  DESCRIPTION OF PROCEDURE:  The procedure was performed from the right femoral artery using 6 French catheters.  He tolerated the procedure without complication.  We did have some difficulty getting across the aortic valve but ultimately we were successful.  HEMODYNAMICS:  The central aortic pressure was 138/74 with a mean of 101.  LV pressure 134/12 with an LVEDP of 18.  There was no gradient on pullback across the aortic valve.  ANGIOGRAPHIC DATA: 1. The aortic root shot demonstrates calcification of the aortic leaflets with    some restriction of motion.  There is no significant aortic regurgitation    and the aortic root does not appear to be substantially dilated. 2. Left ventriculography reveals vigorous global systolic function.  Ejection    fraction is calculated at 60.4%.  There is no segmental wall motion    abnormality. 3. The subclavian internal mammary appeared to be widely patent.  There is    some calcification at the origin of vertebral with about 50% narrowing    eccentrically just inside the ostium. 4. There is extensive calcification of the coronary arteries.  The left main    coronary artery demonstrates about 60% tapered narrowing of the distal    end where it bifurcates into the LAD and circumflex. 5. The LAD demonstrates about 30-40% proximal narrowing and there is a 90%  segmental stenosis in the mid vessel just after the diagonal.  The    diagonal has two sub-branches one of which has 70% narrowing and the other    with 50% narrowing.  All distal vessels appear to be suitable for    grafting. 6. The circumflex provides the first marginal.  The large marginal has about    40% proximal and 70% mid narrowing.  There is a small AV circumflex    distally with 90% narrowing in a marginal sub-branch. 7. The right coronary artery is calcified and has multiple lesions.  There    is 70% in the proximal portion of the mid vessel and 80% segmental    narrowing in the distal portion of the mid vessel.  There are tandem    50% stenoses in the distal vessel and a 50% PDA stenosis.  CONCLUSIONS: 1. Preserved left ventricular function. 2. Probable mild aortic stenosis. 3. Significant three-vessel coronary artery disease including involvement    of the left main.  DISPOSITION:  These lesions are not suitable for percutaneous intervention. Surgical consultation will be obtained.  A 2-D echocardiogram will also be obtained.  ______ followup is with Dr. Johnsie Cancel. DD:  11/16/00 TD:  11/16/00 Job: [id number]

## 2010-08-21 NOTE — Consult Note (Signed)
[facility]  Patient:    [name], [name]                MRN: [id number] Proc. Date: 11/16/00 Adm. Date:  [id number] Attending:  Wadie Lessen CC:         [name] Brasil. Lia Foyer, M.D. [facility]  Collier Salina C. Johnsie Cancel, M.D. [name]  Heinz Knuckles. Norins, M.D. [facility]   Consultation Report  REASON FOR CONSULTATION:  Left main and three-vessel coronary disease.  CHIEF COMPLAINT:  Exertional chest pain and dyspnea.  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 He presented to [facility]. Norins, M.D. for evaluation for possible cholelithiasis. He was complaining of right upper quadrant pain which was near constant with radiation to his flank and that that had been getting worse. During that office visit, he also noted that for the past several months, he had decreased exercise tolerance and decreased tolerance for his activities of daily living. He would often get short of breath over the past several weeks even with showering or routine activities of daily living. Also, if hewould walk to the mailbox and back, he would have shortness of breath, as well as sometimes pressure in his chest and diaphoresis. These symptoms would resolve with rest. He also would get short of breath if he walked up four or five stairs. Because of this symptoms complex being worrisome for angina, he was referred for cardiology evaluation. He was seen and evaluated by Wallis Bamberg. Johnsie Cancel, M.D. and scheduled for cardiac catheterization. He underwent cardiac catheterization today by [name], M.D. which revealed severe three-vessel and left main coronary disease. He had a 60% left main stenosis and 90% LAD stenosis at the takeoff of a large bifurcating first diagonal. He also had a 70% stenosis in  the large OM2. He had diffuse disease in his right coronary with ______ being mid 80% lesion. There was some calcification of the aortic valve noted. There was no aortic insufficiency and no gradient with pullback.  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 He has no history of diabetes, previous cardiac problem, stroke or TIA.  CURRENT MEDICATIONS: 1. Cardizem CD 300 mg p.o. q.d. 2. Zestoretic 20/12.5 mg p.o. q.d. 3. Zantac 75 mg p.o. q.d. 4. Enteric-coated one p.o. q.d.  ALLERGIES:  No known allergies to medications.  FAMILY HISTORY:  Significant for coronary disease. He had a brother who had bypass grafting in his late 59s. He also has a family history of diabetes.  SOCIAL HISTORY:  He is married with three daughters and seven grandchildren. He is retired but remains active until recently, and he does not smoke. He did smoke for awhile but quit 42 years ago.  REVIEW OF SYSTEMS:  He denies stroke, TIA symptoms, including amaurosis or unilateral weakness. He has not had any recent symptoms of vasculitis or headaches. He has not had any wheezing or cough. He does have exertional dyspnea and chest tightness. He has a fatty food intolerance in addition to his right upper quadrant difficulties. He has had no change in his bowel habits, constipation or diarrhea. Had no melena or hematochezia. He has had no recent hematuria or dysuria or pyuria or difficulty voiding. He  has not had any significant weight change. He does not have any bleeding or clotting abnormalities. All other systems are negative.  PHYSICAL EXAMINATION:  GENERAL:  Mr. Atondo is a 77 year old white male in no acute distress. He is well-developed and well-nourished in no acute distress.  VITAL SIGNS:  His blood pressure is 160/90; pulse is 69 and regular,  sinus rhythm by monitor; his respirations are 20.  NEUROLOGICAL:  He is alert and oriented x 3. Grossly intact.  HEENT:  Unremarkable.  LUNGS:  Clear to auscultation and percussion bilaterally. There is no wheezing.  CARDIAC:  Regular rate and rhythm. There is a 2/6 early systolic murmur at the right upper sternal border. This was very difficult to appreciate initially. There are no rubs or gallops.  NECK:  Supple without thyromegaly, adenopathy, JVD, or bruit.  ABDOMEN:  Soft. It is nontender. There are no palpable masses.  EXTREMITIES:  There is no clubbing, cyanosis, or edema. He has 2+ pulses in lower extremities.  LABORATORY DATA:  His BUN and creatinine are 20 and 1.1, glucose was 159. His electrolytes were normal. His PT is 11.7, PTT 22.2. Hematocrit 47, platelet count 242, white blood cell count 7.5.  Cardiac catheterization reviewed.  A 2-D echo pending.  EKG shows normal sinus rhythm with no acute ischemic changes.  IMPRESSION AND PLAN:  Mr. Vazguez is a 77 year old gentleman with several cardiac risk factors who presents now with classic exertional angina which is progressive and now is effecting his activities of daily living. On catheterization, he has severe three-vessel coronary disease and left main disease. He has preserved left ventricular function. There is some calcium apparent at the aortic valve but no gradient or aortic insufficiency. Coronary artery bypass grafting is indicated for relief of symptoms and survival benefit. Based on the catheterization results, there is no indication for aortic valve replacement at this point in time. However, the 2-D echo is pending and will need to be reviewed before arriving at a final decision. I discussed in detail with the patient and his family the indications, risks, benefits, and alternative treatments. They understand that coronary artery  bypass is indicated based on three-vessel disease and left main  disease and that there is survival benefit, as well as excellent relief of symptoms associated with bypass grafting under these circumstances. I discussed with them the risks which include but are not limited to death, stroke, MI, bleeding, possible need for transfusions, infections, DVT, PE, or other organ system dysfunction, including respiratory, renal, hepatic, or GI complications. He probably is at slightly higher risk than normal for cholecystitis. However, the risk to life from the left main disease certainly takes precedence. I answered all the patients and familys questions regarding the operative approach, postoperative length of stay, and anticipated recovery period. They understand and accept the risk and agree to proceed with bypass grafting as planned. We will review the 2-D echo prior to surgery. DD:  11/16/00 TD:  11/16/00 Job: [id number]

## 2010-10-13 ENCOUNTER — Other Ambulatory Visit: Payer: Self-pay | Admitting: Internal Medicine

## 2010-10-13 DIAGNOSIS — E785 Hyperlipidemia, unspecified: Secondary | ICD-10-CM

## 2010-10-14 ENCOUNTER — Other Ambulatory Visit: Payer: Self-pay | Admitting: Internal Medicine

## 2010-10-14 ENCOUNTER — Other Ambulatory Visit (INDEPENDENT_AMBULATORY_CARE_PROVIDER_SITE_OTHER): Payer: Medicare Other | Admitting: Internal Medicine

## 2010-10-14 ENCOUNTER — Other Ambulatory Visit: Payer: Self-pay | Admitting: Family Medicine

## 2010-10-14 DIAGNOSIS — E785 Hyperlipidemia, unspecified: Secondary | ICD-10-CM

## 2010-10-14 DIAGNOSIS — E538 Deficiency of other specified B group vitamins: Secondary | ICD-10-CM

## 2010-10-14 LAB — LIPID PANEL
Total CHOL/HDL Ratio: 4
Triglycerides: 179 mg/dL — ABNORMAL HIGH (ref 0.0–149.0)

## 2010-10-14 LAB — VITAMIN B12: Vitamin B-12: 671 pg/mL (ref 211–911)

## 2010-10-14 LAB — HEPATIC FUNCTION PANEL
Albumin: 4.4 g/dL (ref 3.5–5.2)
Total Protein: 8.4 g/dL — ABNORMAL HIGH (ref 6.0–8.3)

## 2010-10-14 NOTE — Progress Notes (Signed)
Addended by: Ellamae Sia on: 10/14/2010 10:00 AM   Modules accepted: Orders

## 2010-10-15 ENCOUNTER — Encounter: Payer: Self-pay | Admitting: *Deleted

## 2010-10-20 ENCOUNTER — Telehealth: Payer: Self-pay | Admitting: *Deleted

## 2010-10-20 NOTE — Telephone Encounter (Signed)
Patient says that the Gabapentin is not helping with the pain and burning in his feet. He is asking if he could increase it or if he could try something different. Uses [facility] [address]

## 2010-10-21 MED ORDER — GABAPENTIN 300 MG PO CAPS: 900.0000 mg | ORAL_CAPSULE | Freq: Every day | ORAL | Status: DC

## 2010-10-21 NOTE — Telephone Encounter (Signed)
Spoke with patient and advised results  rx sent to pharmacy by e-script, because pt was just about out.

## 2010-10-21 NOTE — Telephone Encounter (Signed)
If he has not had side effects, he can increase nighttime gabapentin to 3 at night (900mg )  If still having sig pain, he can try a 300mg  dose in the morning also---watch for sedation If he gets relief with higher dose, we will refill at that level (he will have to let us know)

## 2010-11-30 ENCOUNTER — Other Ambulatory Visit: Payer: Self-pay | Admitting: *Deleted

## 2010-11-30 MED ORDER — GABAPENTIN 300 MG PO CAPS: 900.0000 mg | ORAL_CAPSULE | Freq: Every day | ORAL | Status: DC

## 2010-11-30 NOTE — Telephone Encounter (Signed)
rx sent to pharmacy by e-script  

## 2010-12-01 ENCOUNTER — Telehealth: Payer: Self-pay | Admitting: *Deleted

## 2010-12-01 MED ORDER — NORTRIPTYLINE HCL 25 MG PO CAPS: 25.0000 mg | ORAL_CAPSULE | Freq: Every day | ORAL | Status: DC

## 2010-12-01 NOTE — Telephone Encounter (Signed)
Needs to continue the gabapentin!!

## 2010-12-01 NOTE — Telephone Encounter (Signed)
Spoke with patient's wife and advised results, patient was going out to mow some yards so she would give him the message.

## 2010-12-01 NOTE — Telephone Encounter (Signed)
Pt states he has been taking 900 mg's of gabapentin twice a day and this isn't helping the burning in his feet.  He would like to try something else.  [facility] [address].

## 2010-12-01 NOTE — Telephone Encounter (Signed)
Please add nortriptylline 25mg  at bedtime and increase to 50mg  at bedtime in 1 week if not really better and no side effects (like dry mouth or trouble urinating) #60 x 1 If still not better, needs appt to review options

## 2010-12-28 ENCOUNTER — Other Ambulatory Visit: Payer: Self-pay | Admitting: Internal Medicine

## 2010-12-29 ENCOUNTER — Ambulatory Visit (INDEPENDENT_AMBULATORY_CARE_PROVIDER_SITE_OTHER): Payer: Medicare Other | Admitting: *Deleted

## 2010-12-29 DIAGNOSIS — Z23 Encounter for immunization: Secondary | ICD-10-CM

## 2011-01-26 ENCOUNTER — Other Ambulatory Visit: Payer: Self-pay | Admitting: *Deleted

## 2011-01-26 MED ORDER — ISOSORBIDE MONONITRATE 60 MG PO TB24: 60.0000 mg | ORAL_TABLET | Freq: Every day | ORAL | Status: DC

## 2011-02-02 ENCOUNTER — Other Ambulatory Visit: Payer: Self-pay | Admitting: Internal Medicine

## 2011-02-16 ENCOUNTER — Ambulatory Visit (INDEPENDENT_AMBULATORY_CARE_PROVIDER_SITE_OTHER): Payer: Medicare Other | Admitting: Internal Medicine

## 2011-02-16 ENCOUNTER — Other Ambulatory Visit: Payer: Self-pay | Admitting: Internal Medicine

## 2011-02-16 ENCOUNTER — Other Ambulatory Visit: Payer: Self-pay | Admitting: *Deleted

## 2011-02-16 ENCOUNTER — Encounter: Payer: Self-pay | Admitting: Internal Medicine

## 2011-02-16 DIAGNOSIS — E1149 Type 2 diabetes mellitus with other diabetic neurological complication: Secondary | ICD-10-CM

## 2011-02-16 DIAGNOSIS — I10 Essential (primary) hypertension: Secondary | ICD-10-CM

## 2011-02-16 DIAGNOSIS — E785 Hyperlipidemia, unspecified: Secondary | ICD-10-CM

## 2011-02-16 DIAGNOSIS — R110 Nausea: Secondary | ICD-10-CM | POA: Insufficient documentation

## 2011-02-16 DIAGNOSIS — K222 Esophageal obstruction: Secondary | ICD-10-CM

## 2011-02-16 MED ORDER — AMLODIPINE BESYLATE 10 MG PO TABS: 10.0000 mg | ORAL_TABLET | Freq: Every day | ORAL | Status: DC

## 2011-02-16 MED ORDER — LOSARTAN POTASSIUM 100 MG PO TABS: 100.0000 mg | ORAL_TABLET | Freq: Every day | ORAL | Status: DC

## 2011-02-16 NOTE — Progress Notes (Signed)
Subjective:    Patient ID: [name], male    DOB: 11-04-1931, 77 y.o.   MRN: [id number]  HPI Doing okay Feels sick in the morning after getting up and getting dressed Not clearly related to neuropathy meds Bowels are "tight"---has to strain Hasn't been taking any meds for bowels  Still having neuropathic pain esp at night Lots of burning still despite the gabapentin and nortriptylline  Checks sugars a couple of times per week Usually in mid to upper 100's--not above 200 Has had eye exam No hypoglycemic reactions  Checks BP at home about once a week---usually 150/70-80 No sig edema No chest pain Stable DOE or slightly worse--relates to gaining some weight  Current Outpatient Prescriptions on File Prior to Visit  Medication Sig Dispense Refill  . amLODipine (NORVASC) 5 MG tablet TAKE ONE DAILY FOR BLOOD PRESSURE  30 tablet  3  . aspirin 81 MG tablet Take 81 mg by mouth daily.        [name] gabapentin (NEURONTIN) 300 MG capsule TAKE 3 CAPSULES BY MOUTH AT BEDTIME. AND IF NO RELIEF TAKE 3 CAPSULES IN THE MORNING.  180 capsule  0  . hydrochlorothiazide 25 MG tablet Take 25 mg by mouth daily.        . isosorbide mononitrate (IMDUR) 60 MG 24 hr tablet Take 1 tablet (60 mg total) by mouth daily.  90 tablet  3  . losartan (COZAAR) 100 MG tablet TAKE 1 TABLET BY MOUTH EVERY DAY FOR BLOOD PRESSURE  30 tablet  3  . metFORMIN (GLUCOPHAGE) 1000 MG tablet TAKE 1 TABLET TWICE A DAY BEFORE BREAKFAST AND SUPPER  60 tablet  7  . Multiple Vitamins-Minerals (ICAPS) CAPS Take 1 capsule by mouth daily.        . nitroGLYCERIN (NITROSTAT) 0.4 MG SL tablet Place 0.4 mg under the tongue every 5 (five) minutes as needed. Chest pain       . nortriptyline (PAMELOR) 25 MG capsule Take 1-2 capsules (25-50 mg total) by mouth at bedtime.  60 capsule  1  . omeprazole (PRILOSEC OTC) 20 MG tablet Take 20 mg by mouth daily.        . pravastatin (PRAVACHOL) 40 MG tablet Take 1 tablet (40 mg total) by mouth daily.   30 tablet  11    Allergies  Allergen Reactions  . Doxazosin Mesylate     REACTION: dizzy  . Methocarbamol     REACTION: RASH    Past Medical History  Diagnosis Date  . Dyspnea 2009 since July -Sept    05/06/08-CPST-  normal effort, reduced VO2 max 20.5 /65%, reduced at 8.2/ 40%, normal breathing resetvca of 55%, submaximal heart rate response 112/77%, flattened o2 pluse response at peak exercise-12 ml/beat @ 85%, No VQ mismatch abnormalities, All c/w CIRC Limitation  . Hyperlipidemia   . Hypertension   . Diabetes mellitus   . GERD (gastroesophageal reflux disease)   . Arthritis     osteoarthritis, s/p R TKR, and digits  . Overweight (BMI 25.0-29.9)     BMI 29  . Interstitial lung disease     NOS  . Pulmonary infiltrates 12-09    stable, 7/10: ? due to GERD  . Diverticulosis   . Hiatal hernia   . Esophageal stricture     s/p dilation spring 2010  . CAD (coronary artery disease)     symptoms  followed lopressor Jan-Jul 09 and lisinopril increase July 09, 1/09 Myoview- non ischemic with an EF of 51% and  inferobasal wall scar, 10/09 Cath- severe native three-vessel coronary artery disease, s/p multivessel coronary bypass graft with all grafts, Normal left ventricular systolic function with normal left ventricular filling pressures,   . History of PFTs     mixed pattern on spiro. mild restn on lung volumes with near normal DLCO. Pattern can be explained by CABG scar. Fev1 2.2L/73%, ratio 68 (67), TLC 4.7/68%,RV 1.5L/55%,DLCO 79%  . CAD (coronary artery disease)     09/04/08 ECHO- LVH, ef 60%, mild AS,01/2003-Adenosine cardiolite- thinning without ischemia EF 55%, 1/08 -Cardiolite -neg    Past Surgical History  Procedure Date  . Cholecystectomy 11/2005    [name]- open  . Coronary artery bypass graft 11/2000    x 5  . Cardiac catheterization 5/06    CP- no MI, Cath- small vessell disease  . Total knee arthroplasty 6/08    R knee- Riddle  . Total knee arthroplasty 12/11    Right-  Dr Telford Nab    Family History  Problem Relation Age of Onset  . COPD Mother   . Heart disease Father   . Diabetes Brother   . Colon cancer Brother   . Alcohol abuse Sister     History   Social History  . Marital Status: Married    Spouse Name: N/A    Number of Children: N/A  . Years of Education: N/A   Occupational History  . lawn mower     retired   Social History Main Topics  . Smoking status: Former Smoker    Quit date: 04/06/1963  . Smokeless tobacco: Never Used  . Alcohol Use: No  . Drug Use: Not on file  . Sexually Active: Not on file   Other Topics Concern  . Not on file   Social History Narrative   Has children   Review of Systems Does sleep okay but does have nocturia x 2-3 occ feels tired in day--will take nap occ problems swallowing food. Heartburn is controlled. Still on prilosec    Objective:   Physical Exam  Constitutional: He appears well-developed and well-nourished. No distress.  Neck: Normal range of motion. Neck supple. No thyromegaly present.  Cardiovascular: Normal rate, regular rhythm, normal heart sounds and intact distal pulses.  Exam reveals no gallop.   No murmur heard. Pulmonary/Chest: Effort normal. No respiratory distress. He has no wheezes. He has rales.       Faint bibasilar crackles  Abdominal: Soft. He exhibits no mass. There is no tenderness.  Musculoskeletal: He exhibits no edema and no tenderness.  Lymphadenopathy:    He has no cervical adenopathy.  Skin:       No foot lesions  Psychiatric: He has a normal mood and affect. His behavior is normal. Judgment and thought content normal.          Assessment & Plan:

## 2011-02-16 NOTE — Assessment & Plan Note (Signed)
BP Readings from Last 3 Encounters:  02/16/11 156/90  08/10/10 158/70  06/09/10 160/100   Not at goal still Will increase amlodipine

## 2011-02-16 NOTE — Assessment & Plan Note (Signed)
Ongoing neuropathic pain Has had acceptable control Lab Results  Component Value Date   HGBA1C 7.7* 08/10/2010   Will recheck

## 2011-02-16 NOTE — Assessment & Plan Note (Signed)
Some swallowing problems again Will continue prilosec GI reeval if worsens

## 2011-02-16 NOTE — Assessment & Plan Note (Signed)
In the morning ??related to amitryiptylline --probably not Likely some degree of diabetic gastroparesis Will start miralax and cathartics prn

## 2011-02-16 NOTE — Assessment & Plan Note (Signed)
Lab Results  Component Value Date   LDLCALC 95 10/14/2010   At goal (under 100) on the pravastatin

## 2011-02-18 ENCOUNTER — Encounter: Payer: Self-pay | Admitting: *Deleted

## 2011-03-04 ENCOUNTER — Other Ambulatory Visit: Payer: Self-pay | Admitting: Internal Medicine

## 2011-03-08 ENCOUNTER — Other Ambulatory Visit: Payer: Self-pay | Admitting: Internal Medicine

## 2011-03-23 ENCOUNTER — Other Ambulatory Visit: Payer: Self-pay | Admitting: Internal Medicine

## 2011-03-23 NOTE — Telephone Encounter (Signed)
Patient called and was seen on 02/16/11 and his Gabapentin was discontinued and he stated it really helped his burning feet and wanted to know if he could get a new refill on his gabapentin.

## 2011-03-24 NOTE — Telephone Encounter (Signed)
.  left message to have patient return my call.  

## 2011-03-24 NOTE — Telephone Encounter (Signed)
Spoke with patient and he take (300mg  tab) 900 mg in the morning and 900 mg at night, please advise on refill. I looked at a previous refill 02/02/11 and it states 3 tabs in the morning and if no relief take 3 tabs at night.

## 2011-03-24 NOTE — Telephone Encounter (Signed)
Okay to refill #90 x 5 Please confirm how he is taking it and adjust Rx if needed (listed as tid--300mg )

## 2011-03-25 MED ORDER — GABAPENTIN 300 MG PO CAPS: 900.0000 mg | ORAL_CAPSULE | Freq: Two times a day (BID) | ORAL | Status: DC

## 2011-03-25 NOTE — Telephone Encounter (Signed)
Okay to fill #180 x 5 3 tabs bid

## 2011-03-25 NOTE — Telephone Encounter (Signed)
rx sent to pharmacy by e-script Spoke with patient and advised results   

## 2011-04-12 ENCOUNTER — Other Ambulatory Visit: Payer: Self-pay | Admitting: Orthopaedic Surgery

## 2011-04-12 DIAGNOSIS — M545 Low back pain: Secondary | ICD-10-CM

## 2011-04-16 ENCOUNTER — Ambulatory Visit
Admission: RE | Admit: 2011-04-16 | Discharge: 2011-04-16 | Disposition: A | Discharge status: Home / Self Care | Payer: Medicare Other | Source: Ambulatory Visit | Attending: Orthopaedic Surgery | Admitting: Orthopaedic Surgery

## 2011-04-16 DIAGNOSIS — M545 Low back pain: Secondary | ICD-10-CM

## 2011-04-27 ENCOUNTER — Ambulatory Visit (INDEPENDENT_AMBULATORY_CARE_PROVIDER_SITE_OTHER): Payer: Medicare Other | Admitting: Internal Medicine

## 2011-04-27 ENCOUNTER — Ambulatory Visit (INDEPENDENT_AMBULATORY_CARE_PROVIDER_SITE_OTHER)
Admission: RE | Admit: 2011-04-27 | Discharge: 2011-04-27 | Disposition: A | Discharge status: Home / Self Care | Payer: Medicare Other | Source: Ambulatory Visit | Attending: Internal Medicine | Admitting: Internal Medicine

## 2011-04-27 ENCOUNTER — Other Ambulatory Visit: Payer: Self-pay | Admitting: Cardiovascular Disease

## 2011-04-27 ENCOUNTER — Encounter: Payer: Self-pay | Admitting: Internal Medicine

## 2011-04-27 VITALS — BP 130/70 | HR 89 | Temp 97.5°F | Wt 220.0 lb

## 2011-04-27 DIAGNOSIS — R0602 Shortness of breath: Secondary | ICD-10-CM | POA: Insufficient documentation

## 2011-04-27 DIAGNOSIS — R42 Dizziness and giddiness: Secondary | ICD-10-CM | POA: Insufficient documentation

## 2011-04-27 DIAGNOSIS — I10 Essential (primary) hypertension: Secondary | ICD-10-CM

## 2011-04-27 DIAGNOSIS — R110 Nausea: Secondary | ICD-10-CM

## 2011-04-27 DIAGNOSIS — E785 Hyperlipidemia, unspecified: Secondary | ICD-10-CM

## 2011-04-27 LAB — CBC WITH DIFFERENTIAL/PLATELET
Basophils Relative: 0.3 % (ref 0.0–3.0)
Eosinophils Relative: 4.4 % (ref 0.0–5.0)
HCT: 39.4 % (ref 39.0–52.0)
Lymphs Abs: 2.8 10*3/uL (ref 0.7–4.0)
MCV: 85.9 fl (ref 78.0–100.0)
Monocytes Relative: 6.6 % (ref 3.0–12.0)
Platelets: 239 10*3/uL (ref 150.0–400.0)
RBC: 4.59 Mil/uL (ref 4.22–5.81)
WBC: 11.9 10*3/uL — ABNORMAL HIGH (ref 4.5–10.5)

## 2011-04-27 LAB — BASIC METABOLIC PANEL
BUN: 30 mg/dL — ABNORMAL HIGH (ref 6–23)
Chloride: 100 mEq/L (ref 96–112)
GFR: 43.43 mL/min — ABNORMAL LOW (ref >60.00)
Potassium: 4.6 mEq/L (ref 3.5–5.1)
Sodium: 138 mEq/L (ref 135–145)

## 2011-04-27 LAB — HEPATIC FUNCTION PANEL
Albumin: 4 g/dL (ref 3.5–5.2)
Bilirubin, Direct: 0 mg/dL (ref 0.0–0.3)
Total Protein: 8.2 g/dL (ref 6.0–8.3)

## 2011-04-27 LAB — BRAIN NATRIURETIC PEPTIDE: Pro B Natriuretic peptide (BNP): 13 pg/mL (ref 0.0–100.0)

## 2011-04-27 LAB — TSH: TSH: 1.78 u[IU]/mL (ref 0.35–5.50)

## 2011-04-27 MED ORDER — HYDROCHLOROTHIAZIDE 25 MG PO TABS: 25.0000 mg | ORAL_TABLET | Freq: Every day | ORAL | Status: DC

## 2011-04-27 NOTE — Assessment & Plan Note (Signed)
No recent clear cut angina Will stop his imdur for now Due for cardiology eval Will consider stopping HCTZ if BUN up also

## 2011-04-27 NOTE — Assessment & Plan Note (Addendum)
Non specific Could be ischemic or reflect change in cardiac function CXR shows interstitial changes but no clear CHF or pneumonia (radiologist reading is pending)

## 2011-04-27 NOTE — Assessment & Plan Note (Signed)
Ongoing problems with this Probably gastroparesis but will send to GI to evaluate for other possiblities

## 2011-04-27 NOTE — Progress Notes (Signed)
Subjective:    Patient ID: [name], male    DOB: 1931/10/22, 78 y.o.   MRN: [id number]  HPI Having some dizzy spells Balance issues---has to hold onto things Only is standing--after up for a while--like 30-60 seconds Sense of "blacking out" Passes if he holds onto something for a while  Gets congestion on right side of head Got azelastine from ENT but not helping a whole lot (only brief)  Having SOB Even occurs at rest May be more prominent when lying down per wife No cough or fever  Gets sick after eating---will "heave"  Trouble eating anything for breakfast Can't drink milk Does better later in the day Bowels are okay  Has some upper chest pain---doesn't feel like his heart Hasn't used NTG under tongue in some time  Current Outpatient Prescriptions on File Prior to Visit  Medication Sig Dispense Refill  . amLODipine (NORVASC) 10 MG tablet Take 1 tablet (10 mg total) by mouth daily.  30 tablet  11  . aspirin 81 MG tablet Take 81 mg by mouth daily.        [name] gabapentin (NEURONTIN) 300 MG capsule Take 3 capsules (900 mg total) by mouth 2 (two) times daily.  180 capsule  5  . hydrochlorothiazide (HYDRODIURIL) 25 MG tablet Take 1 tablet (25 mg total) by mouth daily.  30 tablet  0  . isosorbide mononitrate (IMDUR) 60 MG 24 hr tablet Take 1 tablet (60 mg total) by mouth daily.  90 tablet  3  . losartan (COZAAR) 100 MG tablet Take 1 tablet (100 mg total) by mouth daily.  30 tablet  11  . metFORMIN (GLUCOPHAGE) 1000 MG tablet TAKE 1 TABLET TWICE A DAY BEFORE BREAKFAST AND SUPPER  60 tablet  7  . Multiple Vitamins-Minerals (ICAPS) CAPS Take 1 capsule by mouth daily.        . nitroGLYCERIN (NITROSTAT) 0.4 MG SL tablet Place 0.4 mg under the tongue every 5 (five) minutes as needed. Chest pain       . nortriptyline (PAMELOR) 25 MG capsule TAKE 1-2 CAPSULES BY MOUTH AT BEDTIME.  60 capsule  1  . omeprazole (PRILOSEC OTC) 20 MG tablet Take 20 mg by mouth daily.        .  polyethylene glycol (MIRALAX / GLYCOLAX) packet Take 17 g by mouth daily.        . pravastatin (PRAVACHOL) 40 MG tablet Take 1 tablet (40 mg total) by mouth daily.  30 tablet  11  . DISCONTD: hydrochlorothiazide 25 MG tablet Take 25 mg by mouth daily.          Allergies  Allergen Reactions  . Doxazosin Mesylate     REACTION: dizzy  . Methocarbamol     REACTION: RASH    Past Medical History  Diagnosis Date  . Dyspnea 2009 since July -Sept    05/06/08-CPST-  normal effort, reduced VO2 max 20.5 /65%, reduced at 8.2/ 40%, normal breathing resetvca of 55%, submaximal heart rate response 112/77%, flattened o2 pluse response at peak exercise-12 ml/beat @ 85%, No VQ mismatch abnormalities, All c/w CIRC Limitation  . Hyperlipidemia   . Hypertension   . Diabetes mellitus   . GERD (gastroesophageal reflux disease)   . Arthritis     osteoarthritis, s/p R TKR, and digits  . Overweight (BMI 25.0-29.9)     BMI 29  . Interstitial lung disease     NOS  . Pulmonary infiltrates 12-09    stable, 7/10: ? due to  GERD  . Diverticulosis   . Hiatal hernia   . Esophageal stricture     s/p dilation spring 2010  . CAD (coronary artery disease)     symptoms  followed lopressor Jan-Jul 09 and lisinopril increase July 09, 1/09 Myoview- non ischemic with an EF of 51% and inferobasal wall scar, 10/09 Cath- severe native three-vessel coronary artery disease, s/p multivessel coronary bypass graft with all grafts, Normal left ventricular systolic function with normal left ventricular filling pressures,   . History of PFTs     mixed pattern on spiro. mild restn on lung volumes with near normal DLCO. Pattern can be explained by CABG scar. Fev1 2.2L/73%, ratio 68 (67), TLC 4.7/68%,RV 1.5L/55%,DLCO 79%  . CAD (coronary artery disease)     09/04/08 ECHO- LVH, ef 60%, mild AS,01/2003-Adenosine cardiolite- thinning without ischemia EF 55%, 1/08 -Cardiolite -neg    Past Surgical History  Procedure Date  . Cholecystectomy  11/2005    [name]- open  . Coronary artery bypass graft 11/2000    x 5  . Cardiac catheterization 5/06    CP- no MI, Cath- small vessell disease  . Total knee arthroplasty 6/08    R knee- Riddle  . Total knee arthroplasty 12/11    Right- Dr Telford Nab    Family History  Problem Relation Age of Onset  . COPD Mother   . Heart disease Father   . Diabetes Brother   . Colon cancer Brother   . Alcohol abuse Sister     History   Social History  . Marital Status: Married    Spouse Name: N/A    Number of Children: N/A  . Years of Education: N/A   Occupational History  . lawn mower     retired   Social History Main Topics  . Smoking status: Former Smoker    Quit date: 04/06/1963  . Smokeless tobacco: Never Used  . Alcohol Use: No  . Drug Use: Not on file  . Sexually Active: Not on file   Other Topics Concern  . Not on file   Social History Narrative   Has children   Review of Systems Weight has been fairly stable over the past couple of years Sleeps okay in general. No clear PND Still with freq nocturia    Objective:   Physical Exam  Constitutional: He appears well-developed and well-nourished. No distress.  Neck: Normal range of motion. Neck supple.  Cardiovascular: Normal rate, regular rhythm, normal heart sounds and intact distal pulses.  Exam reveals no gallop.   No murmur heard. Pulmonary/Chest: Effort normal. No respiratory distress. He has no wheezes. He has rales.       Some bibasilar crackles but more on right  Musculoskeletal: He exhibits no edema and no tenderness.  Lymphadenopathy:    He has no cervical adenopathy.  Psychiatric: He has a normal mood and affect. His behavior is normal. Judgment and thought content normal.          Assessment & Plan:

## 2011-04-27 NOTE — Patient Instructions (Addendum)
Please stop the isosorbide Please set up appt with gastroenterologist

## 2011-04-28 ENCOUNTER — Telehealth: Payer: Self-pay | Admitting: *Deleted

## 2011-04-28 ENCOUNTER — Encounter: Payer: Self-pay | Admitting: Cardiovascular Disease

## 2011-04-28 ENCOUNTER — Ambulatory Visit (INDEPENDENT_AMBULATORY_CARE_PROVIDER_SITE_OTHER): Payer: Medicare Other | Admitting: Cardiovascular Disease

## 2011-04-28 DIAGNOSIS — E785 Hyperlipidemia, unspecified: Secondary | ICD-10-CM

## 2011-04-28 DIAGNOSIS — R079 Chest pain, unspecified: Secondary | ICD-10-CM

## 2011-04-28 DIAGNOSIS — R0602 Shortness of breath: Secondary | ICD-10-CM

## 2011-04-28 DIAGNOSIS — I359 Nonrheumatic aortic valve disorder, unspecified: Secondary | ICD-10-CM

## 2011-04-28 DIAGNOSIS — I10 Essential (primary) hypertension: Secondary | ICD-10-CM

## 2011-04-28 DIAGNOSIS — I2510 Atherosclerotic heart disease of native coronary artery without angina pectoris: Secondary | ICD-10-CM

## 2011-04-28 MED ORDER — NITROGLYCERIN 0.4 MG SL SUBL: 0.4000 mg | SUBLINGUAL_TABLET | SUBLINGUAL | Status: DC | PRN

## 2011-04-28 NOTE — Assessment & Plan Note (Signed)
With normal BNP and CXR showing no fluid not likely cardiac.  Echo to make sure EF still normal and AS mild

## 2011-04-28 NOTE — Assessment & Plan Note (Signed)
Well controlled.  Continue current medications and low sodium Dash type diet.    

## 2011-04-28 NOTE — Assessment & Plan Note (Signed)
Cholesterol is at goal.  Continue current dose of statin and diet Rx.  No myalgias or side effects.  F/U  LFT's in 6 months. Lab Results  Component Value Date   [place] 95 10/14/2010

## 2011-04-28 NOTE — Assessment & Plan Note (Signed)
Recurrent SSCP in setting of nause and vohmiting and chronic esophageal problems.  Grafts patent on cath 2009.  F/U myovue

## 2011-04-28 NOTE — Telephone Encounter (Signed)
Per staff message pt needs appt with Dr Johnsie Cancel  or Richardson Dopp pac  This week to f/u sob ./cy

## 2011-04-28 NOTE — Assessment & Plan Note (Signed)
Mild by exam f/u echo

## 2011-04-28 NOTE — Patient Instructions (Signed)
Your physician wants you to follow-up in: [place] will receive a reminder letter in the mail two months in advance. If you don't receive a letter, please call our office to schedule the follow-up appointment. Your physician recommends that you continue on your current medications as directed. Please refer to the Current Medication list given to you today. Your physician has requested that you have an echocardiogram. Echocardiography is a painless test that uses sound waves to create images of your heart. It provides your doctor with information about the size and shape of your heart and how well your heart's chambers and valves are working. This procedure takes approximately one hour. There are no restrictions for this procedure. DX DYSPNEA Your physician has requested that you have a lexiscan myoview. For further information please visit [email or web address]. Please follow instruction sheet, as given. DX CHEST PAIN

## 2011-04-28 NOTE — Progress Notes (Signed)
[name] is seen today for F/U of CAD with ischemic DCM EF around 35% in past but most recent echo 12/10 normal. 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 He has had strictures and multiple endoscopy's. He has had a distant CABG and cath 01/2008 showed widely patent grafts and normal EF. I think his dyspnea is more related to bronchiectasis and interstitial lung disease as documented by CT. he has been compliant with his meds He also has mild AS on recent echo and should probably have another one 03/2011.  Been ill since coming back from beach a week or two ago. Had nausea and vohmiting with SSCP.  Mild cough. CXR with bronchiectasis no CHF BNP normal ECG in office today NSR rate 31 LAFB old IMI no acute chanes  ROS: Denies fever, malais, weight loss, blurry vision, decreased visual acuity, cough, sputum, SOB, hemoptysis, pleuritic pain, palpitaitons, heartburn, abdominal pain, melena, lower extremity edema, claudication, or rash.  All other systems reviewed and negative  General: Affect appropriate Healthy:  appears stated age 78: normal Neck supple with no adenopathy JVP normal no bruits no thyromegaly Lungs inspitory crackles both bases and good diaphragmatic motion Heart:  S1/S2 SEM  murmur,rub, gallop or click PMI normal Abdomen: benighn, BS positve, no tenderness, no AAA no bruit.  No HSM or HJR Distal pulses intact with no bruits No edema Neuro non-focal Skin warm and dry No muscular weakness   Current Outpatient Prescriptions  Medication Sig Dispense Refill  . amLODipine (NORVASC) 10 MG tablet Take 1 tablet (10 mg total) by mouth daily.  30 tablet  11  . aspirin 81 MG tablet Take 81 mg by mouth daily.        [name] gabapentin (NEURONTIN) 300 MG capsule Take 3 capsules (900 mg total) by mouth 2 (two) times daily.  180 capsule  5  . hydrochlorothiazide (HYDRODIURIL) 25 MG tablet Take 1 tablet (25 mg total) by mouth daily.  30  tablet  0  . losartan (COZAAR) 100 MG tablet Take 1 tablet (100 mg total) by mouth daily.  30 tablet  11  . metFORMIN (GLUCOPHAGE) 1000 MG tablet TAKE 1 TABLET TWICE A DAY BEFORE BREAKFAST AND SUPPER  60 tablet  7  . Multiple Vitamins-Minerals (ICAPS) CAPS Take 1 capsule by mouth daily.        . nitroGLYCERIN (NITROSTAT) 0.4 MG SL tablet Place 0.4 mg under the tongue every 5 (five) minutes as needed. Chest pain       . nortriptyline (PAMELOR) 25 MG capsule TAKE 1-2 CAPSULES BY MOUTH AT BEDTIME.  60 capsule  1  . omeprazole (PRILOSEC OTC) 20 MG tablet Take 20 mg by mouth daily.        . polyethylene glycol (MIRALAX / GLYCOLAX) packet Take 17 g by mouth daily.        . pravastatin (PRAVACHOL) 40 MG tablet Take 1 tablet (40 mg total) by mouth daily.  30 tablet  11  . vitamin B-12 (CYANOCOBALAMIN) 1000 MCG tablet Take 1,000 mcg by mouth daily.        Allergies  Doxazosin mesylate and Methocarbamol  Electrocardiogram:  See HPI  Assessment and Plan

## 2011-04-28 NOTE — Telephone Encounter (Signed)
PT WAS SEEN TODAY AT 3:00 PM   BY DR Johnsie Cancel .Adonis Housekeeper

## 2011-05-04 ENCOUNTER — Telehealth: Payer: Self-pay | Admitting: Internal Medicine

## 2011-05-04 ENCOUNTER — Ambulatory Visit (HOSPITAL_COMMUNITY): Payer: Medicare Other | Attending: Cardiology

## 2011-05-04 DIAGNOSIS — I2589 Other forms of chronic ischemic heart disease: Secondary | ICD-10-CM | POA: Insufficient documentation

## 2011-05-04 DIAGNOSIS — I2510 Atherosclerotic heart disease of native coronary artery without angina pectoris: Secondary | ICD-10-CM | POA: Insufficient documentation

## 2011-05-04 DIAGNOSIS — E785 Hyperlipidemia, unspecified: Secondary | ICD-10-CM | POA: Insufficient documentation

## 2011-05-04 DIAGNOSIS — R072 Precordial pain: Secondary | ICD-10-CM

## 2011-05-04 DIAGNOSIS — I359 Nonrheumatic aortic valve disorder, unspecified: Secondary | ICD-10-CM

## 2011-05-04 DIAGNOSIS — I10 Essential (primary) hypertension: Secondary | ICD-10-CM | POA: Insufficient documentation

## 2011-05-04 DIAGNOSIS — R0602 Shortness of breath: Secondary | ICD-10-CM

## 2011-05-04 DIAGNOSIS — R079 Chest pain, unspecified: Secondary | ICD-10-CM | POA: Insufficient documentation

## 2011-05-04 NOTE — Telephone Encounter (Signed)
Pt returned nurse call from Jan 28th.  Call back # [phone]

## 2011-05-05 NOTE — Telephone Encounter (Signed)
Please address

## 2011-05-06 ENCOUNTER — Ambulatory Visit (HOSPITAL_COMMUNITY): Payer: Medicare Other | Attending: Cardiovascular Disease | Admitting: Radiology

## 2011-05-06 DIAGNOSIS — E119 Type 2 diabetes mellitus without complications: Secondary | ICD-10-CM | POA: Insufficient documentation

## 2011-05-06 DIAGNOSIS — R079 Chest pain, unspecified: Secondary | ICD-10-CM | POA: Insufficient documentation

## 2011-05-06 DIAGNOSIS — Z87891 Personal history of nicotine dependence: Secondary | ICD-10-CM | POA: Insufficient documentation

## 2011-05-06 DIAGNOSIS — E785 Hyperlipidemia, unspecified: Secondary | ICD-10-CM | POA: Insufficient documentation

## 2011-05-06 DIAGNOSIS — R0602 Shortness of breath: Secondary | ICD-10-CM | POA: Insufficient documentation

## 2011-05-06 DIAGNOSIS — I10 Essential (primary) hypertension: Secondary | ICD-10-CM | POA: Insufficient documentation

## 2011-05-06 DIAGNOSIS — R0609 Other forms of dyspnea: Secondary | ICD-10-CM | POA: Insufficient documentation

## 2011-05-06 DIAGNOSIS — Z951 Presence of aortocoronary bypass graft: Secondary | ICD-10-CM | POA: Insufficient documentation

## 2011-05-06 DIAGNOSIS — R0989 Other specified symptoms and signs involving the circulatory and respiratory systems: Secondary | ICD-10-CM | POA: Insufficient documentation

## 2011-05-06 DIAGNOSIS — I2510 Atherosclerotic heart disease of native coronary artery without angina pectoris: Secondary | ICD-10-CM | POA: Insufficient documentation

## 2011-05-06 DIAGNOSIS — R42 Dizziness and giddiness: Secondary | ICD-10-CM | POA: Insufficient documentation

## 2011-05-06 MED ORDER — TECHNETIUM TC 99M TETROFOSMIN IV KIT
33.0000 | PACK | Freq: Once | INTRAVENOUS | Status: AC | PRN
  Administered 2011-05-06: 33 via INTRAVENOUS

## 2011-05-06 MED ORDER — REGADENOSON 0.4 MG/5ML IV SOLN
0.4000 mg | Freq: Once | INTRAVENOUS | Status: AC
  Administered 2011-05-06: 0.4 mg via INTRAVENOUS

## 2011-05-06 MED ORDER — TECHNETIUM TC 99M TETROFOSMIN IV KIT
11.0000 | PACK | Freq: Once | INTRAVENOUS | Status: AC | PRN
  Administered 2011-05-06: 11 via INTRAVENOUS

## 2011-05-06 NOTE — Progress Notes (Signed)
[place] [address] [phone]  Cardiology Nuclear Med Study  [name] is a 78 y.o. male [id number] 05/15/1931   Nuclear Med Background Indication for Stress Test:  Evaluation for Ischemia and Graft Patency History:  '02 CABG x 5, 1/09 Myocardial Perfusion Study-inferobasal scar, (-) ischemia, EF=51%, 10/09 Heart Catheterization-Patent grafts, EF=60%, 05/04/11 Echo: EF=55-60%, mild LVH, mild AS Cardiac Risk Factors: Family History - CAD, History of Smoking, Hypertension, Lipids and NIDDM  Symptoms: Chest Pain with/without exertion (last occurrence one month ago),  Dizziness, DOE and SOB   Nuclear Pre-Procedure Caffeine/Decaff Intake:  None NPO After: 9:00pm   Lungs:  Clear IV 0.9% NS with Angio Cath:  20g  IV Site: R Antecubital  IV Started by:  Eliezer Lofts, EMT-P  Chest Size (in):  44 Cup Size: n/a  Height: 6\' 1"  (1.854 m)  Weight:  220 lb (99.791 kg)  BMI:  Body mass index is 29.03 kg/(m^2). Tech Comments:  CBG=147 @ 7am, per patient.    Nuclear Med Study 1 or 2 day study: 1 day  Stress Test Type:  Carlton Adam  Reading MD: [name], MD  Order Authorizing Provider:  [name], MD  Resting Radionuclide: Technetium 20m Tetrofosmin  Resting Radionuclide Dose: 11.0 mCi   Stress Radionuclide:  Technetium 46m Tetrofosmin  Stress Radionuclide Dose: 33.0 mCi           Stress Protocol Rest HR: 83 Stress HR: 93  Rest BP: 143/80 Stress BP: 154/77  Exercise Time (min): n/a METS: n/a   Predicted Max HR: 143 bpm % Max HR: 65.03 bpm Rate Pressure Product: 14322   Dose of Adenosine (mg):  n/a Dose of Lexiscan: 0.4 mg  Dose of Atropine (mg): n/a Dose of Dobutamine: n/a mcg/kg/min (at max HR)  Stress Test Technologist: Irven Baltimore, RN  Nuclear Technologist:  Charlton Amor, CNMT     Rest Procedure:  Myocardial perfusion imaging was performed at rest 45 minutes following the intravenous administration  of Technetium 68m Tetrofosmin. Rest ECG: NSR with nonspecific T wave changes  Stress Procedure:  The patient received IV Lexiscan 0.4 mg over 15-seconds.  Technetium 31m Tetrofosmin injected at 30-seconds.  There were nonspecific T wave changes with Lexiscan. There were frequent PVC's, trigeminy and bigeminy PVC's.  Quantitative spect images were obtained after a 45 minute delay. Stress ECG: No significant change from baseline ECG  QPS Raw Data Images:  Normal; no motion artifact; normal heart/lung ratio. Stress Images:  There is decreased uptake in the inferior wall. Rest Images:  There is decreased uptake in the inferior wall. Subtraction (SDS):  There is a fixed defect that is most consistent with a previous infarction. Transient Ischemic Dilatation (Normal <1.22):  1.05 Lung/Heart Ratio (Normal <0.45):  0.27  Quantitative Gated Spect Images QGS EDV:  117 ml QGS ESV:  65 ml QGS cine images:  Inferobasal hypokinesis QGS EF: 45%  Impression Exercise Capacity:  Lexiscan with no exercise. BP Response:  Normal blood pressure response. Clinical Symptoms:  No chest pain. ECG Impression:  No significant ST segment change suggestive of ischemia. Comparison with Prior Nuclear Study: No change compared to 2009  Overall Impression:  Low risk stress nuclear study.  Small inferobasal wall scar with no ischemia  [name]

## 2011-05-17 ENCOUNTER — Other Ambulatory Visit: Payer: Self-pay | Admitting: Internal Medicine

## 2011-05-17 ENCOUNTER — Ambulatory Visit (INDEPENDENT_AMBULATORY_CARE_PROVIDER_SITE_OTHER): Payer: Medicare Other | Admitting: Internal Medicine

## 2011-05-17 ENCOUNTER — Encounter: Payer: Self-pay | Admitting: Internal Medicine

## 2011-05-17 VITALS — BP 136/80 | HR 84 | Ht 74.0 in | Wt 224.4 lb

## 2011-05-17 DIAGNOSIS — R110 Nausea: Secondary | ICD-10-CM

## 2011-05-17 DIAGNOSIS — Z86010 Personal history of colonic polyps: Secondary | ICD-10-CM

## 2011-05-17 DIAGNOSIS — K222 Esophageal obstruction: Secondary | ICD-10-CM

## 2011-05-17 DIAGNOSIS — K219 Gastro-esophageal reflux disease without esophagitis: Secondary | ICD-10-CM

## 2011-05-17 NOTE — Patient Instructions (Signed)
Please follow up as needed 

## 2011-05-17 NOTE — Progress Notes (Signed)
HISTORY OF PRESENT ILLNESS:  [name] is a 78 y.o. male with multiple medical problems including hypertension, hyperlipidemia, diabetes mellitus, coronary artery disease status post coronary artery bypass grafting, interstitial lung disease, GERD complicated by peptic stricture, and adenomatous colon polyps. The patient was last seen in the office in July of 2010. See that dictation. He subsequently underwent colonoscopy and upper endoscopy in August of 2010. Colonoscopy revealed diminutive colon polyps and mild sigmoid diverticulosis. Followup in 5 years recommended. Upper endoscopy revealed a distal esophageal stricture which was dilated with a 54 [place] Maloney dilator. The patient presents today regarding recent problems with nausea, routine followup for GERD, and question regarding timing of surveillance colonoscopy. First, patient had difficulties with nausea. He was seen by his primary provider. There was a concern that isosorbide was contributing. After discontinuation of the medication, the symptom has completely resolved. He continues on omeprazole 20 mg daily for GERD. Good control of symptoms. No recurrent dysphagia post dilation. He denies lower GI complaints such as change in bowel habits or bleeding. Review of laboratories from 3 weeks ago shows normal CBC with hemoglobin of 13.4.  REVIEW OF SYSTEMS:  All non-GI ROS negative except for arthritis, back pain  Past Medical History  Diagnosis Date  . Dyspnea 2009 since July -Sept    05/06/08-CPST-  normal effort, reduced VO2 max 20.5 /65%, reduced at 8.2/ 40%, normal breathing resetvca of 55%, submaximal heart rate response 112/77%, flattened o2 pluse response at peak exercise-12 ml/beat @ 85%, No VQ mismatch abnormalities, All c/w CIRC Limitation  . Hyperlipidemia   . Hypertension   . Diabetes mellitus   . GERD (gastroesophageal reflux disease)   . Arthritis     osteoarthritis, s/p R TKR, and digits  . Overweight (BMI 25.0-29.9)       BMI 29  . Interstitial lung disease     NOS  . Pulmonary infiltrates 12-09    stable, 7/10: ? due to GERD  . Diverticulosis   . Hiatal hernia   . Esophageal stricture     s/p dilation spring 2010  . CAD (coronary artery disease)     symptoms  followed lopressor Jan-Jul 09 and lisinopril increase July 09, 1/09 Myoview- non ischemic with an EF of 51% and inferobasal wall scar, 10/09 Cath- severe native three-vessel coronary artery disease, s/p multivessel coronary bypass graft with all grafts, Normal left ventricular systolic function with normal left ventricular filling pressures,   . History of PFTs     mixed pattern on spiro. mild restn on lung volumes with near normal DLCO. Pattern can be explained by CABG scar. Fev1 2.2L/73%, ratio 68 (67), TLC 4.7/68%,RV 1.5L/55%,DLCO 79%  . CAD (coronary artery disease)     09/04/08 ECHO- LVH, ef 60%, mild AS,01/2003-Adenosine cardiolite- thinning without ischemia EF 55%, 1/08 -Cardiolite -neg  . Colon polyps     Past Surgical History  Procedure Date  . Cholecystectomy 11/2005    [name]- open  . Coronary artery bypass graft 11/2000    x 5  . Cardiac catheterization 5/06    CP- no MI, Cath- small vessell disease  . Total knee arthroplasty 6/08    R knee- Riddle  . Total knee arthroplasty 12/11    Right- Dr Telford Nab    Social History [name]  reports that he quit smoking about 48 years ago. He has never used smokeless tobacco. He reports that he does not drink alcohol. His drug history not on file.  family history includes  Alcohol abuse in his sister; COPD in his mother; Colon cancer in his brother; Diabetes in his brother; and Heart disease in his father.  Allergies  Allergen Reactions  . Doxazosin Mesylate     REACTION: dizzy  . Methocarbamol     REACTION: RASH       PHYSICAL EXAMINATION: Vital signs: BP 136/80  Pulse 84  Ht 6\' 2"  (1.88 m)  Wt 224 lb 6.4 oz (101.787 kg)  BMI 28.81 kg/m2 General: Well-developed,  well-nourished, no acute distress HEENT: Sclerae are anicteric, conjunctiva pink. Oral mucosa intact Lungs: Clear Heart: Regular with soft systolic murmur Abdomen: soft, nontender, nondistended, no obvious ascites, no peritoneal signs, normal bowel sounds. No organomegaly.no succussion splash Extremities: No edema Psychiatric: alert and oriented x3. Cooperative    ASSESSMENT:  #1. Nausea secondary to isosorbide. Resolved after discontinuation of medication #2. GERD complicated by peptic stricture. The patient is asymptomatic post dilation on PPI #3. History of adenomatous colon polyps. Last colonoscopy August 2010 #4. Multiple medical problems  PLAN:  #1. Reflux precautions #2. Continue PPI #3. Surveillance colonoscopy around August 2015 #4. Interval GI followup as needed #5. Return to the care of Dr. Silvio Pate

## 2011-05-27 ENCOUNTER — Telehealth: Payer: Self-pay | Admitting: Internal Medicine

## 2011-05-27 NOTE — Telephone Encounter (Signed)
appt scheduled for 12:30pm

## 2011-05-27 NOTE — Telephone Encounter (Signed)
Can add on at 3:15 if he wants to come in tomorrow

## 2011-05-27 NOTE — Telephone Encounter (Signed)
Triage Record Num: [id number] Operator: Earnest Rosier Patient Name: [name]" Legrand Como Call Date & Time: 05/27/2011 2:21:42PM Patient Phone: [phone] PCP: Viviana Simpler Patient Gender: Male PCP Fax : [phone] Patient DOB: 10-Apr-1931 Practice Name: [facility] Reason for Call: Caller: Brenda/daughter PCP: Viviana Simpler I.; CB#: [phone]; Call regarding weakness onset 05/26/11, new onset 05/27/11 of fever, congestion, nausea, with being dizzy, chills. Symptoms reviewed URI Guideline, short of breath, with symptoms, call provider, calll to office with no appt in EPIC, Caren Griffins advises will call pt with recommendations. Protocol(s) Used: Upper Respiratory Infection (URI) Recommended Outcome per Protocol: Call Provider Immediately Reason for Outcome: Any temperature elevation in an immunocompromised individual OR frail elderly Care Advice: ~ Another adult should drive. ~ Go to the ED if has new onset of stiff neck, vomiting, drowsiness or confusion

## 2011-05-28 ENCOUNTER — Other Ambulatory Visit: Payer: Self-pay

## 2011-05-28 ENCOUNTER — Encounter: Payer: Self-pay | Admitting: Internal Medicine

## 2011-05-28 ENCOUNTER — Ambulatory Visit (INDEPENDENT_AMBULATORY_CARE_PROVIDER_SITE_OTHER)
Admission: RE | Admit: 2011-05-28 | Discharge: 2011-05-28 | Disposition: A | Discharge status: Home / Self Care | Payer: Medicare Other | Source: Ambulatory Visit | Attending: Internal Medicine | Admitting: Internal Medicine

## 2011-05-28 ENCOUNTER — Ambulatory Visit (INDEPENDENT_AMBULATORY_CARE_PROVIDER_SITE_OTHER): Payer: Medicare Other | Admitting: Internal Medicine

## 2011-05-28 VITALS — BP 140/80 | HR 101 | Temp 98.1°F | Resp 24 | Ht 74.0 in | Wt 220.0 lb

## 2011-05-28 DIAGNOSIS — R42 Dizziness and giddiness: Secondary | ICD-10-CM

## 2011-05-28 DIAGNOSIS — J209 Acute bronchitis, unspecified: Secondary | ICD-10-CM | POA: Insufficient documentation

## 2011-05-28 DIAGNOSIS — R0602 Shortness of breath: Secondary | ICD-10-CM

## 2011-05-28 MED ORDER — LEVOFLOXACIN 500 MG PO TABS: 500.0000 mg | ORAL_TABLET | Freq: Every day | ORAL | Status: AC

## 2011-05-28 NOTE — Assessment & Plan Note (Signed)
Ongoing issues Doesn't appear to be cardiac with benign stress test Likely pulmonary

## 2011-05-28 NOTE — Progress Notes (Signed)
Subjective:    Patient ID: [name], male    DOB: 06-11-1931, 78 y.o.   MRN: [id number]  HPI Has been sick for a few days Got dizzy while rearranging pictures in carport Back hurt and had to sit down Started with respiratory illness and fever yesterday SOB, cough, congestion Breathing had seemed better till then  Had epidural shot in back last week--hasn't helped May need to try something else now  Current Outpatient Prescriptions on File Prior to Visit  Medication Sig Dispense Refill  . amLODipine (NORVASC) 10 MG tablet Take 1 tablet (10 mg total) by mouth daily.  30 tablet  11  . aspirin 81 MG tablet Take 81 mg by mouth daily.        [name] gabapentin (NEURONTIN) 300 MG capsule Take 3 capsules (900 mg total) by mouth 2 (two) times daily.  180 capsule  5  . hydrochlorothiazide (HYDRODIURIL) 25 MG tablet Take 1 tablet (25 mg total) by mouth daily.  30 tablet  0  . losartan (COZAAR) 100 MG tablet Take 1 tablet (100 mg total) by mouth daily.  30 tablet  11  . metFORMIN (GLUCOPHAGE) 1000 MG tablet TAKE 1 TABLET TWICE A DAY BEFORE BREAKFAST AND SUPPER  60 tablet  7  . Multiple Vitamins-Minerals (ICAPS) CAPS Take 1 capsule by mouth daily.        . nitroGLYCERIN (NITROSTAT) 0.4 MG SL tablet Place 1 tablet (0.4 mg total) under the tongue every 5 (five) minutes as needed. Chest pain  25 tablet  4  . nortriptyline (PAMELOR) 25 MG capsule TAKE 1 OR 2 CAPSULES BY MOUTH AT BEDTIME.  60 capsule  1  . omeprazole (PRILOSEC OTC) 20 MG tablet Take 20 mg by mouth daily.        . polyethylene glycol (MIRALAX / GLYCOLAX) packet Take 17 g by mouth daily.        . pravastatin (PRAVACHOL) 40 MG tablet Take 1 tablet (40 mg total) by mouth daily.  30 tablet  11  . vitamin B-12 (CYANOCOBALAMIN) 1000 MCG tablet Take 1,000 mcg by mouth daily.        Allergies  Allergen Reactions  . Doxazosin Mesylate     REACTION: dizzy  . Methocarbamol     REACTION: RASH    Past Medical History  Diagnosis Date  .  Dyspnea 2009 since July -Sept    05/06/08-CPST-  normal effort, reduced VO2 max 20.5 /65%, reduced at 8.2/ 40%, normal breathing resetvca of 55%, submaximal heart rate response 112/77%, flattened o2 pluse response at peak exercise-12 ml/beat @ 85%, No VQ mismatch abnormalities, All c/w CIRC Limitation  . Hyperlipidemia   . Hypertension   . Diabetes mellitus   . GERD (gastroesophageal reflux disease)   . Arthritis     osteoarthritis, s/p R TKR, and digits  . Overweight (BMI 25.0-29.9)     BMI 29  . Interstitial lung disease     NOS  . Pulmonary infiltrates 12-09    stable, 7/10: ? due to GERD  . Diverticulosis   . Hiatal hernia   . Esophageal stricture     s/p dilation spring 2010  . CAD (coronary artery disease)     symptoms  followed lopressor Jan-Jul 09 and lisinopril increase July 09, 1/09 Myoview- non ischemic with an EF of 51% and inferobasal wall scar, 10/09 Cath- severe native three-vessel coronary artery disease, s/p multivessel coronary bypass graft with all grafts, Normal left ventricular systolic function with normal left ventricular  filling pressures,   . History of PFTs     mixed pattern on spiro. mild restn on lung volumes with near normal DLCO. Pattern can be explained by CABG scar. Fev1 2.2L/73%, ratio 68 (67), TLC 4.7/68%,RV 1.5L/55%,DLCO 79%  . CAD (coronary artery disease)     09/04/08 ECHO- LVH, ef 60%, mild AS,01/2003-Adenosine cardiolite- thinning without ischemia EF 55%, 1/08 -Cardiolite -neg  . Colon polyps     Past Surgical History  Procedure Date  . Cholecystectomy 11/2005    [name]- open  . Coronary artery bypass graft 11/2000    x 5  . Cardiac catheterization 5/06    CP- no MI, Cath- small vessell disease  . Total knee arthroplasty 6/08    R knee- Riddle  . Total knee arthroplasty 12/11    Right- Dr Telford Nab    Family History  Problem Relation Age of Onset  . COPD Mother   . Heart disease Father   . Diabetes Brother   . Colon cancer Brother   .  Alcohol abuse Sister     History   Social History  . Marital Status: Married    Spouse Name: N/A    Number of Children: N/A  . Years of Education: N/A   Occupational History  . lawn mower     retired   Social History Main Topics  . Smoking status: Former Smoker    Quit date: 04/06/1963  . Smokeless tobacco: Never Used  . Alcohol Use: No  . Drug Use: Not on file  . Sexually Active: Not on file   Other Topics Concern  . Not on file   Social History Narrative   Has children   Review of Systems Recent stress test was okay Appetite is okay Sleep is still not good    Objective:   Physical Exam  Constitutional: He appears well-developed and well-nourished.       Looks fatigued but no distress  HENT:  Mouth/Throat: Oropharynx is clear and moist. No oropharyngeal exudate.       Canals with cerumen Mild nasal inflammation with green mucus on right  Neck: Normal range of motion. Neck supple.  Pulmonary/Chest: Effort normal.       Decreased breath sounds Some dullness at right base Basilar crackles-- R>L  Musculoskeletal: He exhibits no edema.  Lymphadenopathy:    He has no cervical adenopathy.          Assessment & Plan:

## 2011-05-28 NOTE — Assessment & Plan Note (Signed)
Fairly quick onset of illness with fever, etc CXR shows ?RLL atelectasis but no clear fever Has underlying lung disease  Will treat with levaquin Recheck if breathing worsens

## 2011-05-28 NOTE — Assessment & Plan Note (Signed)
If persists, will consider stopping his HCTZ

## 2011-06-02 ENCOUNTER — Other Ambulatory Visit: Payer: Self-pay | Admitting: *Deleted

## 2011-06-02 MED ORDER — HYDROCHLOROTHIAZIDE 25 MG PO TABS: 25.0000 mg | ORAL_TABLET | Freq: Every day | ORAL | Status: DC

## 2011-06-28 ENCOUNTER — Telehealth: Payer: Self-pay | Admitting: Cardiovascular Disease

## 2011-06-28 NOTE — Telephone Encounter (Signed)
PT COMPLAINING THAT HCTZ IS MAKING FEEL  HIM  NAUSEATED HE  STOPPED  TAKING MED FOR FEW DAYS AND SYMPTOM  DISSAPPEARED RESTARTED TAKING AND  SAME SYMPTOM RETURNED  PT AWARE WILL FORWARD TO DR Johnsie Cancel FOR REVIEW./CY

## 2011-06-28 NOTE — Telephone Encounter (Signed)
Ok to stop diuretic continue other BP meds

## 2011-06-28 NOTE — Telephone Encounter (Signed)
Please return call to patient at hm# [phone]   Patient would like to discuss his medications, would like to discontinue Hydrochlorothiazide.  Patient can be reached at hm# [phone]

## 2011-06-28 NOTE — Telephone Encounter (Signed)
Pt rtn call, do not see a rtn call to pt [phone]

## 2011-06-30 NOTE — Telephone Encounter (Signed)
PT'S DAUGHTER AWARE WILL LET PT KNOW OKAY TO STOP DIURETIC./CY

## 2011-08-02 ENCOUNTER — Other Ambulatory Visit: Payer: Self-pay | Admitting: Internal Medicine

## 2011-08-17 ENCOUNTER — Encounter: Payer: Self-pay | Admitting: Internal Medicine

## 2011-08-17 ENCOUNTER — Ambulatory Visit (INDEPENDENT_AMBULATORY_CARE_PROVIDER_SITE_OTHER): Payer: Medicare Other | Admitting: Internal Medicine

## 2011-08-17 VITALS — BP 148/80 | HR 96 | Temp 97.7°F | Ht 74.0 in | Wt 224.0 lb

## 2011-08-17 DIAGNOSIS — E1149 Type 2 diabetes mellitus with other diabetic neurological complication: Secondary | ICD-10-CM

## 2011-08-17 DIAGNOSIS — Z79899 Other long term (current) drug therapy: Secondary | ICD-10-CM

## 2011-08-17 DIAGNOSIS — E1142 Type 2 diabetes mellitus with diabetic polyneuropathy: Secondary | ICD-10-CM

## 2011-08-17 DIAGNOSIS — K219 Gastro-esophageal reflux disease without esophagitis: Secondary | ICD-10-CM

## 2011-08-17 DIAGNOSIS — E785 Hyperlipidemia, unspecified: Secondary | ICD-10-CM

## 2011-08-17 DIAGNOSIS — Z23 Encounter for immunization: Secondary | ICD-10-CM

## 2011-08-17 DIAGNOSIS — I10 Essential (primary) hypertension: Secondary | ICD-10-CM

## 2011-08-17 DIAGNOSIS — G5790 Unspecified mononeuropathy of unspecified lower limb: Secondary | ICD-10-CM

## 2011-08-17 LAB — CBC WITH DIFFERENTIAL/PLATELET
Basophils Relative: 0.8 % (ref 0.0–3.0)
Eosinophils Absolute: 0.4 10*3/uL (ref 0.0–0.7)
HCT: 38.9 % — ABNORMAL LOW (ref 39.0–52.0)
Hemoglobin: 12.9 g/dL — ABNORMAL LOW (ref 13.0–17.0)
MCHC: 33.1 g/dL (ref 30.0–36.0)
MCV: 86.8 fl (ref 78.0–100.0)
Monocytes Absolute: 0.7 10*3/uL (ref 0.1–1.0)
Neutro Abs: 5.7 10*3/uL (ref 1.4–7.7)
RBC: 4.49 Mil/uL (ref 4.22–5.81)

## 2011-08-17 LAB — LIPID PANEL
Total CHOL/HDL Ratio: 4
Triglycerides: 158 mg/dL — ABNORMAL HIGH (ref 0.0–149.0)

## 2011-08-17 LAB — BASIC METABOLIC PANEL
CO2: 27 mEq/L (ref 19–32)
Chloride: 103 mEq/L (ref 96–112)
Sodium: 141 mEq/L (ref 135–145)

## 2011-08-17 LAB — HEPATIC FUNCTION PANEL
ALT: 19 U/L (ref 0–53)
Bilirubin, Direct: 0 mg/dL (ref 0.0–0.3)
Total Protein: 8.3 g/dL (ref 6.0–8.3)

## 2011-08-17 NOTE — Assessment & Plan Note (Signed)
Control seems to have slipped Will check A1c He will work harder on lifestyle Consider glipizide if markedly worse

## 2011-08-17 NOTE — Progress Notes (Signed)
Subjective:    Patient ID: [name], male    DOB: 1931-05-29, 78 y.o.   MRN: [id number]  HPI Doing okay Did get over the respiratory illness  Gets AM nausea if eats right after getting off HCTZ stopped by Dr Johnsie Cancel in case that was causing the nausea---and it is better now Delays breakfast and does okay Swallows okay  Checks sugars twice a week ~170 fasting No hypoglycemic reactions Not overly compliant with diabetic diet  Ongoing toe pain but not bad Takes the gabapentin and nortriptylline  Occ checks BP with wrist Doesn't remember numbers No headaches No chest pain No SOB No edema  Current Outpatient Prescriptions on File Prior to Visit  Medication Sig Dispense Refill  . amLODipine (NORVASC) 10 MG tablet Take 1 tablet (10 mg total) by mouth daily.  30 tablet  11  . aspirin 81 MG tablet Take 81 mg by mouth daily.        [name] gabapentin (NEURONTIN) 300 MG capsule Take 3 capsules (900 mg total) by mouth 2 (two) times daily.  180 capsule  5  . losartan (COZAAR) 100 MG tablet Take 1 tablet (100 mg total) by mouth daily.  30 tablet  11  . metFORMIN (GLUCOPHAGE) 1000 MG tablet TAKE 1 TABLET TWICE A DAY BEFORE BREAKFAST AND SUPPER  60 tablet  7  . Multiple Vitamins-Minerals (ICAPS) CAPS Take 1 capsule by mouth daily.        . nitroGLYCERIN (NITROSTAT) 0.4 MG SL tablet Place 1 tablet (0.4 mg total) under the tongue every 5 (five) minutes as needed. Chest pain  25 tablet  4  . nortriptyline (PAMELOR) 25 MG capsule TAKE 1 OR 2 CAPSULES BY MOUTH AT BEDTIME.  60 capsule  1  . omeprazole (PRILOSEC OTC) 20 MG tablet Take 20 mg by mouth daily.        . polyethylene glycol (MIRALAX / GLYCOLAX) packet Take 17 g by mouth daily.        . vitamin B-12 (CYANOCOBALAMIN) 1000 MCG tablet Take 1,000 mcg by mouth daily.      [name] DISCONTD: pravastatin (PRAVACHOL) 40 MG tablet Take 1 tablet (40 mg total) by mouth daily.  30 tablet  11    Allergies  Allergen Reactions  . Doxazosin Mesylate    REACTION: dizzy  . Methocarbamol     REACTION: RASH    Past Medical History  Diagnosis Date  . Dyspnea 2009 since July -Sept    05/06/08-[facility]ST-  normal effort, reduced VO2 max 20.5 /65%, reduced at 8.2/ 40%, normal breathing resetvca of 55%, submaximal heart rate response 112/77%, flattened o2 pluse response at peak exercise-12 ml/beat @ 85%, No VQ mismatch abnormalities, All c/w CIRC Limitation  . Hyperlipidemia   . Hypertension   . Diabetes mellitus   . GERD (gastroesophageal reflux disease)   . Arthritis     osteoarthritis, s/p R TKR, and digits  . Overweight (BMI 25.0-29.9)     BMI 29  . Interstitial lung disease     NOS  . Pulmonary infiltrates 12-09    stable, 7/10: ? due to GERD  . Diverticulosis   . Hiatal hernia   . Esophageal stricture     s/p dilation spring 2010  . CAD (coronary artery disease)     symptoms  followed lopressor Jan-Jul 09 and lisinopril increase July 09, 1/09 Myoview- non ischemic with an EF of 51% and inferobasal wall scar, 10/09 Cath- severe native three-vessel coronary artery disease, s/p multivessel coronary bypass  graft with all grafts, Normal left ventricular systolic function with normal left ventricular filling pressures,   . History of PFTs     mixed pattern on spiro. mild restn on lung volumes with near normal DLCO. Pattern can be explained by CABG scar. Fev1 2.2L/73%, ratio 68 (67), TLC 4.7/68%,RV 1.5L/55%,DLCO 79%  . CAD (coronary artery disease)     09/04/08 ECHO- LVH, ef 60%, mild AS,01/2003-Adenosine cardiolite- thinning without ischemia EF 55%, 1/08 -Cardiolite -neg  . Colon polyps     Past Surgical History  Procedure Date  . Cholecystectomy 11/2005    [name]- open  . Coronary artery bypass graft 11/2000    x 5  . Cardiac catheterization 5/06    CP- no MI, Cath- small vessell disease  . Total knee arthroplasty 6/08    R knee- Riddle  . Total knee arthroplasty 12/11    Right- Dr Telford Nab    Family History  Problem Relation Age of  Onset  . COPD Mother   . Heart disease Father   . Diabetes Brother   . Colon cancer Brother   . Alcohol abuse Sister     History   Social History  . Marital Status: Married    Spouse Name: N/A    Number of Children: N/A  . Years of Education: N/A   Occupational History  . lawn mower     retired   Social History Main Topics  . Smoking status: Former Smoker    Quit date: 04/06/1963  . Smokeless tobacco: Never Used  . Alcohol Use: No  . Drug Use: Not on file  . Sexually Active: Not on file   Other Topics Concern  . Not on file   Social History Narrative   Has children   Review of Systems Sleeps fairly well Nocturia x 2 is stable No sig daytime urinary problems Appetite is fine     Objective:   Physical Exam  Constitutional: He appears well-developed and well-nourished. No distress.  Neck: Normal range of motion. Neck supple. No thyromegaly present.  Cardiovascular: Normal rate, regular rhythm, normal heart sounds and intact distal pulses.  Exam reveals no gallop.   No murmur heard. Pulmonary/Chest: Effort normal and breath sounds normal. No respiratory distress. He has no wheezes. He has no rales.  Abdominal: Soft. There is no tenderness.  Musculoskeletal: He exhibits no edema.  Lymphadenopathy:    He has no cervical adenopathy.  Skin: No rash noted. No erythema.  Psychiatric: He has a normal mood and affect. His behavior is normal.          Assessment & Plan:

## 2011-08-17 NOTE — Assessment & Plan Note (Signed)
Due for labs No problems on med

## 2011-08-17 NOTE — Assessment & Plan Note (Signed)
fair control on his 2 meds No change

## 2011-08-17 NOTE — Assessment & Plan Note (Signed)
Okay on meds

## 2011-08-17 NOTE — Assessment & Plan Note (Signed)
BP Readings from Last 3 Encounters:  08/17/11 148/80  05/28/11 140/80  05/17/11 136/80   Up a little off the HCTZ but nausea better No changes for now Due back with Dr Johnsie Cancel this summer

## 2011-08-18 ENCOUNTER — Encounter: Payer: Self-pay | Admitting: *Deleted

## 2011-09-07 ENCOUNTER — Other Ambulatory Visit: Payer: Self-pay | Admitting: *Deleted

## 2011-09-07 MED ORDER — PRAVASTATIN SODIUM 40 MG PO TABS: 40.0000 mg | ORAL_TABLET | Freq: Every day | ORAL | Status: DC

## 2011-09-09 ENCOUNTER — Telehealth: Payer: Self-pay | Admitting: Internal Medicine

## 2011-09-09 ENCOUNTER — Emergency Department (HOSPITAL_COMMUNITY)
Admission: EM | Admit: 2011-09-09 | Discharge: 2011-09-09 | Disposition: A | Discharge status: Home / Self Care | Payer: Medicare Other | Attending: Emergency Medicine | Admitting: Emergency Medicine

## 2011-09-09 ENCOUNTER — Encounter (HOSPITAL_COMMUNITY): Payer: Self-pay | Admitting: *Deleted

## 2011-09-09 ENCOUNTER — Emergency Department (HOSPITAL_COMMUNITY): Payer: Medicare Other

## 2011-09-09 DIAGNOSIS — E785 Hyperlipidemia, unspecified: Secondary | ICD-10-CM | POA: Insufficient documentation

## 2011-09-09 DIAGNOSIS — R609 Edema, unspecified: Secondary | ICD-10-CM | POA: Insufficient documentation

## 2011-09-09 DIAGNOSIS — R0600 Dyspnea, unspecified: Secondary | ICD-10-CM

## 2011-09-09 DIAGNOSIS — I10 Essential (primary) hypertension: Secondary | ICD-10-CM | POA: Insufficient documentation

## 2011-09-09 DIAGNOSIS — K219 Gastro-esophageal reflux disease without esophagitis: Secondary | ICD-10-CM | POA: Insufficient documentation

## 2011-09-09 DIAGNOSIS — E119 Type 2 diabetes mellitus without complications: Secondary | ICD-10-CM | POA: Insufficient documentation

## 2011-09-09 DIAGNOSIS — I2510 Atherosclerotic heart disease of native coronary artery without angina pectoris: Secondary | ICD-10-CM | POA: Insufficient documentation

## 2011-09-09 DIAGNOSIS — Z79899 Other long term (current) drug therapy: Secondary | ICD-10-CM | POA: Insufficient documentation

## 2011-09-09 DIAGNOSIS — Z8739 Personal history of other diseases of the musculoskeletal system and connective tissue: Secondary | ICD-10-CM | POA: Insufficient documentation

## 2011-09-09 DIAGNOSIS — R600 Localized edema: Secondary | ICD-10-CM

## 2011-09-09 DIAGNOSIS — R0989 Other specified symptoms and signs involving the circulatory and respiratory systems: Secondary | ICD-10-CM | POA: Insufficient documentation

## 2011-09-09 DIAGNOSIS — R0609 Other forms of dyspnea: Secondary | ICD-10-CM | POA: Insufficient documentation

## 2011-09-09 DIAGNOSIS — Z7982 Long term (current) use of aspirin: Secondary | ICD-10-CM | POA: Insufficient documentation

## 2011-09-09 LAB — COMPREHENSIVE METABOLIC PANEL
ALT: 18 U/L (ref 0–53)
AST: 21 U/L (ref 0–37)
CO2: 23 mEq/L (ref 19–32)
Chloride: 100 mEq/L (ref 96–112)
GFR calc non Af Amer: 46 mL/min — ABNORMAL LOW (ref >90)
Potassium: 3.9 mEq/L (ref 3.5–5.1)
Sodium: 136 mEq/L (ref 135–145)
Total Bilirubin: 0.4 mg/dL (ref 0.3–1.2)

## 2011-09-09 LAB — CBC
HCT: 36.3 % — ABNORMAL LOW (ref 39.0–52.0)
Platelets: 214 10*3/uL (ref 150–400)
RDW: 13.9 % (ref 11.5–15.5)
WBC: 9.9 10*3/uL (ref 4.0–10.5)

## 2011-09-09 LAB — DIFFERENTIAL
Basophils Absolute: 0.1 10*3/uL (ref 0.0–0.1)
Lymphocytes Relative: 26 % (ref 12–46)
Neutro Abs: 6.1 10*3/uL (ref 1.7–7.7)

## 2011-09-09 MED ORDER — FUROSEMIDE 20 MG PO TABS: 20.0000 mg | ORAL_TABLET | Freq: Every day | ORAL | Status: DC | PRN

## 2011-09-09 MED ORDER — FUROSEMIDE 10 MG/ML IJ SOLN
40.0000 mg | Freq: Once | INTRAMUSCULAR | Status: AC
  Administered 2011-09-09: 40 mg via INTRAVENOUS
  Filled 2011-09-09: qty 4

## 2011-09-09 NOTE — Discharge Instructions (Signed)
Edema Edema is an abnormal build-up of fluids in tissues. Because this is partly dependent on gravity (water flows to the lowest place), it is more common in the leg sand thighs (lower extremities). It is also common in the looser tissues, like around the eyes. Painless swelling of the feet and ankles is common and increases as a person ages. It may affect both legs and may include the calves or even thighs. When squeezed, the fluid may move out of the affected area and may leave a dent for a few moments. CAUSES   Prolonged standing or sitting in one place for extended periods of time. Movement helps pump tissue fluid into the veins, and absence of movement prevents this, resulting in edema.   Varicose veins. The valves in the veins do not work as well as they should. This causes fluid to leak into the tissues.   Fluid and salt overload.   Injury, burn, or surgery to the leg, ankle, or foot, may damage veins and allow fluid to leak out.   Sunburn damages vessels. Leaky vessels allow fluid to go out into the sunburned tissues.   Allergies (from insect bites or stings, medications or chemicals) cause swelling by allowing vessels to become leaky.   Protein in the blood helps keep fluid in your vessels. Low protein, as in malnutrition, allows fluid to leak out.   Hormonal changes, including pregnancy and menstruation, cause fluid retention. This fluid may leak out of vessels and cause edema.   Medications that cause fluid retention. Examples are sex hormones, blood pressure medications, steroid treatment, or anti-depressants.   Some illnesses cause edema, especially heart failure, kidney disease, or liver disease.   Surgery that cuts veins or lymph nodes, such as surgery done for the heart or for breast cancer, may result in edema.  DIAGNOSIS  Your caregiver is usually easily able to determine what is causing your swelling (edema) by simply asking what is wrong (getting a history) and examining  you (doing a physical). Sometimes x-rays, EKG (electrocardiogram or heart tracing), and blood work may be done to evaluate for underlying medical illness. TREATMENT  General treatment includes:  Leg elevation (or elevation of the affected body part).   Restriction of fluid intake.   Prevention of fluid overload.   Compression of the affected body part. Compression with elastic bandages or support stockings squeezes the tissues, preventing fluid from entering and forcing it back into the blood vessels.   Diuretics (also called water pills or fluid pills) pull fluid out of your body in the form of increased urination. These are effective in reducing the swelling, but can have side effects and must be used only under your caregiver's supervision. Diuretics are appropriate only for some types of edema.  The specific treatment can be directed at any underlying causes discovered. Heart, liver, or kidney disease should be treated appropriately. HOME CARE INSTRUCTIONS   Elevate the legs (or affected body part) above the level of the heart, while lying down.   Avoid sitting or standing still for prolonged periods of time.   Avoid putting anything directly under the knees when lying down, and do not wear constricting clothing or garters on the upper legs.   Exercising the legs causes the fluid to work back into the veins and lymphatic channels. This may help the swelling go down.   The pressure applied by elastic bandages or support stockings can help reduce ankle swelling.   A low-salt diet may help reduce fluid   retention and decrease the ankle swelling.   Take any medications exactly as prescribed.  SEEK MEDICAL CARE IF:  Your edema is not responding to recommended treatments. SEEK IMMEDIATE MEDICAL CARE IF:   You develop shortness of breath or chest pain.   You cannot breathe when you lay down; or if, while lying down, you have to get up and go to the window to get your breath.   You  are having increasing swelling without relief from treatment.   You develop a fever over 102 F (38.9 C).   You develop pain or redness in the areas that are swollen.   Tell your caregiver right away if you have gained 3 lb/1.4 kg in 1 day or 5 lb/2.3 kg in a week.  MAKE SURE YOU:   Understand these instructions.   Will watch your condition.   Will get help right away if you are not doing well or get worse.  Document Released: 03/22/2005 Document Revised: 03/11/2011 Document Reviewed: 11/08/2007 [employer] Patient Information 2012 [employer]. 

## 2011-09-09 NOTE — Telephone Encounter (Signed)
Caller: Hamed/Patient; PCP: Viviana Simpler I.; CB#: [phone];  Call regarding Swelling of Legs; Onset: 09/05/11.   Bilateral edema of feet and lower legs to knees present. Reports shortness of breath with mild exertion or talking.  Advised to call 911 now for worsening breathing problems and known cardiac condition.

## 2011-09-09 NOTE — ED Provider Notes (Addendum)
History     CSN: [id number]  Arrival date & time 09/09/11  1748   First MD Initiated Contact with Patient 09/09/11 1909      Chief Complaint  Patient presents with  . Shortness of Breath    (Consider location/radiation/quality/duration/timing/severity/associated sxs/prior treatment) HPI Pt presents with worseing SOb with any exertion x 2 weeks and several day of bl lower ext swelling. No orthopnea. No cough, fever, chills, chest pain. Pt has known interstitial lung disease and chronic dyspnea.   Past Medical History  Diagnosis Date  . Dyspnea 2009 since July -Sept    05/06/08-[facility]ST-  normal effort, reduced VO2 max 20.5 /65%, reduced at 8.2/ 40%, normal breathing resetvca of 55%, submaximal heart rate response 112/77%, flattened o2 pluse response at peak exercise-12 ml/beat @ 85%, No VQ mismatch abnormalities, All c/w CIRC Limitation  . Hyperlipidemia   . Hypertension   . Diabetes mellitus   . GERD (gastroesophageal reflux disease)   . Arthritis     osteoarthritis, s/p R TKR, and digits  . Overweight (BMI 25.0-29.9)     BMI 29  . Interstitial lung disease     NOS  . Pulmonary infiltrates 12-09    stable, 7/10: ? due to GERD  . Diverticulosis   . Hiatal hernia   . Esophageal stricture     s/p dilation spring 2010  . CAD (coronary artery disease)     symptoms  followed lopressor Jan-Jul 09 and lisinopril increase July 09, 1/09 Myoview- non ischemic with an EF of 51% and inferobasal wall scar, 10/09 Cath- severe native three-vessel coronary artery disease, s/p multivessel coronary bypass graft with all grafts, Normal left ventricular systolic function with normal left ventricular filling pressures,   . History of PFTs     mixed pattern on spiro. mild restn on lung volumes with near normal DLCO. Pattern can be explained by CABG scar. Fev1 2.2L/73%, ratio 68 (67), TLC 4.7/68%,RV 1.5L/55%,DLCO 79%  . CAD (coronary artery disease)     09/04/08 ECHO- LVH, ef 60%, mild AS,01/2003-Adenosine  cardiolite- thinning without ischemia EF 55%, 1/08 -Cardiolite -neg  . Colon polyps     Past Surgical History  Procedure Date  . Cholecystectomy 11/2005    [name]- open  . Coronary artery bypass graft 11/2000    x 5  . Cardiac catheterization 5/06    CP- no MI, Cath- small vessell disease  . Total knee arthroplasty 6/08    R knee- Riddle  . Total knee arthroplasty 12/11    Right- Dr Telford Nab    Family History  Problem Relation Age of Onset  . COPD Mother   . Heart disease Father   . Diabetes Brother   . Colon cancer Brother   . Alcohol abuse Sister     History  Substance Use Topics  . Smoking status: Former Smoker    Quit date: 04/06/1963  . Smokeless tobacco: Never Used  . Alcohol Use: No      Review of Systems  Constitutional: Negative for fever and chills.  Respiratory: Positive for shortness of breath. Negative for cough, chest tightness, wheezing and stridor.   Cardiovascular: Positive for leg swelling. Negative for chest pain and palpitations.  Gastrointestinal: Negative for nausea, vomiting and abdominal pain.  Neurological: Negative for dizziness, weakness, light-headedness, numbness and headaches.    Allergies  Doxazosin mesylate and Methocarbamol  Home Medications   Current Outpatient Rx  Name Route Sig Dispense Refill  . AMLODIPINE BESYLATE 10 MG PO TABS Oral Take 1  tablet (10 mg total) by mouth daily. 30 tablet 11  . ASPIRIN 81 MG PO TABS Oral Take 81 mg by mouth daily.      [name] GABAPENTIN 300 MG PO CAPS Oral Take 3 capsules (900 mg total) by mouth 2 (two) times daily. 180 capsule 5  . LOSARTAN POTASSIUM 100 MG PO TABS Oral Take 1 tablet (100 mg total) by mouth daily. 30 tablet 11  . METFORMIN HCL 1000 MG PO TABS  TAKE 1 TABLET TWICE A DAY BEFORE BREAKFAST AND SUPPER 60 tablet 7  . ICAPS PO CAPS Oral Take 1 capsule by mouth daily.      [name] NORTRIPTYLINE HCL 25 MG PO CAPS  TAKE 1 OR 2 CAPSULES BY MOUTH AT BEDTIME. 60 capsule 1  . OMEPRAZOLE MAGNESIUM 20  MG PO TBEC Oral Take 20 mg by mouth daily.      [name] PRAVASTATIN SODIUM 40 MG PO TABS Oral Take 1 tablet (40 mg total) by mouth daily. 30 tablet 11  . VITAMIN B-12 1000 MCG PO TABS Oral Take 1,000 mcg by mouth daily.    . FUROSEMIDE 20 MG PO TABS Oral Take 1 tablet (20 mg total) by mouth daily as needed (Leg swelling). 30 tablet 0  . NITROGLYCERIN 0.4 MG SL SUBL Sublingual Place 1 tablet (0.4 mg total) under the tongue every 5 (five) minutes as needed. Chest pain 25 tablet 4    BP 165/83  Pulse 95  Temp(Src) 98.2 F (36.8 C) (Oral)  Resp 18  SpO2 96%  Physical Exam  Nursing note and vitals reviewed. Constitutional: He is oriented to person, place, and time. He appears well-developed and well-nourished. No distress.  HENT:  Head: Normocephalic and atraumatic.  Mouth/Throat: Oropharynx is clear and moist.  Eyes: EOM are normal. Pupils are equal, round, and reactive to light.  Neck: Normal range of motion. Neck supple.  Cardiovascular: Normal rate and regular rhythm.   Pulmonary/Chest: Effort normal. No respiratory distress. He has no wheezes. He has rales (crackle in bl lung bases).  Abdominal: Soft. Bowel sounds are normal. There is no tenderness. There is no rebound and no guarding.  Musculoskeletal: Normal range of motion. He exhibits edema (1 + edema bl ankles). He exhibits no tenderness.  Neurological: He is alert and oriented to person, place, and time.  Skin: Skin is warm and dry. No rash noted. No erythema.  Psychiatric: He has a normal mood and affect. His behavior is normal.    ED Course  Procedures (including critical care time)  Labs Reviewed  CBC - Abnormal; Notable for the following:    Hemoglobin 12.3 (*)    HCT 36.3 (*)    All other components within normal limits  COMPREHENSIVE METABOLIC PANEL - Abnormal; Notable for the following:    Glucose, Bld 196 (*)    Creatinine, Ser 1.41 (*)    GFR calc non Af Amer 46 (*)    GFR calc Af Amer 54 (*)    All other  components within normal limits  DIFFERENTIAL  TROPONIN I  PRO B NATRIURETIC PEPTIDE   Dg Chest 2 View  09/09/2011  *RADIOLOGY REPORT*  Clinical Data: Shortness of breath.  Feet swelling.  CHEST - 2 VIEW  Comparison: Chest x-ray 05/28/2011.  Findings: Lung volumes are normal.  No consolidative airspace disease.  No pleural effusions.  No pneumothorax.  No pulmonary nodule or mass noted. Linear opacities in the right base are unchanged compared to the prior examination, most compatible with an  area of scarring.  Pulmonary vasculature and the cardiomediastinal silhouette are within normal limits.  The patient is status post median sternotomy for CABG.  IMPRESSION: 1. No radiographic evidence of acute cardiopulmonary disease. 2.  Appearance of chest is unchanged compared to priors, as above.  Original Report Authenticated By: Etheleen Mayhew, M.D.     1. Dyspnea   2. Pedal edema      Date: 10/09/2011  Rate: 101  Rhythm: sinus tachycardia  QRS Axis: normal  Intervals: normal  ST/T Wave abnormalities: nonspecific ST changes  Conduction Disutrbances:none  Narrative Interpretation:   Old EKG Reviewed: none available    MDM  Pt diuresed 700 cc and states he is feeling much better and at his baseline. Will give low dose Lasix prescription and pt advised to f/u closely with pulmonologist and PMD.         Julianne Rice, MD 09/09/11 [place], MD 10/09/11 [phone]

## 2011-09-09 NOTE — ED Notes (Signed)
Sob and swollen extremities since Sunday.  No pain no sob now.  Hx of chf

## 2011-09-10 ENCOUNTER — Telehealth: Payer: Self-pay | Admitting: Cardiovascular Disease

## 2011-09-10 NOTE — Telephone Encounter (Signed)
Seen in ER and sent home after diuresis Will set up follow up next week

## 2011-09-10 NOTE — Telephone Encounter (Signed)
Please return call to patient regarding hospital f/u, he can be reached at [phone].

## 2011-09-13 NOTE — Telephone Encounter (Signed)
PER PT WAS SEEN IN ER ON Thursday  (09-09-11 )  FOR SOB AND  EDEMA AND WAS TOLD TO FOLLOW UP WITH DR Johnsie Cancel .OFFERED APPT WITH PA THIS WEEK DECLINED AT THIS TIME  PER PT HAS APPT WITH DR [name] TOM WILL FOLLOW UP THERE FIRST HAS  APPT WITH DR Johnsie Cancel IN AUGUST 2013 AND WILL CALL IF NEEDS EARLIER APPT .Adonis Housekeeper

## 2011-09-14 ENCOUNTER — Encounter: Payer: Self-pay | Admitting: Internal Medicine

## 2011-09-14 ENCOUNTER — Ambulatory Visit (INDEPENDENT_AMBULATORY_CARE_PROVIDER_SITE_OTHER): Payer: Medicare Other | Admitting: Internal Medicine

## 2011-09-14 VITALS — BP 158/88 | HR 99 | Temp 97.5°F | Ht 74.0 in | Wt 227.0 lb

## 2011-09-14 DIAGNOSIS — I5030 Unspecified diastolic (congestive) heart failure: Secondary | ICD-10-CM | POA: Insufficient documentation

## 2011-09-14 DIAGNOSIS — I5032 Chronic diastolic (congestive) heart failure: Secondary | ICD-10-CM

## 2011-09-14 DIAGNOSIS — I5022 Chronic systolic (congestive) heart failure: Secondary | ICD-10-CM | POA: Insufficient documentation

## 2011-09-14 MED ORDER — FUROSEMIDE 20 MG PO TABS: 20.0000 mg | ORAL_TABLET | Freq: Every day | ORAL | Status: DC | PRN

## 2011-09-14 NOTE — Progress Notes (Signed)
Subjective:    Patient ID: [name], male    DOB: 1931/06/03, 78 y.o.   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 amLODipine (NORVASC) 10 MG tablet Take 1 tablet (10 mg total) by mouth daily.  30 tablet  11  . aspirin 81 MG tablet Take 81 mg by mouth daily.        . furosemide (LASIX) 20 MG tablet Take 1 tablet (20 mg total) by mouth daily as needed (Leg swelling).  30 tablet  0  . gabapentin (NEURONTIN) 300 MG capsule Take 3 capsules (900 mg total) by mouth 2 (two) times daily.  180 capsule  5  . losartan (COZAAR) 100 MG tablet Take 1 tablet (100 mg total) by mouth daily.  30 tablet  11  . metFORMIN (GLUCOPHAGE) 1000 MG tablet TAKE 1 TABLET TWICE A DAY BEFORE BREAKFAST AND SUPPER  60 tablet  7  . Multiple Vitamins-Minerals (ICAPS) CAPS Take 1 capsule by mouth daily.        . nitroGLYCERIN (NITROSTAT) 0.4 MG SL tablet Place 1 tablet (0.4 mg total) under the tongue every 5 (five) minutes as needed. Chest pain  25 tablet  4  . nortriptyline (PAMELOR) 25 MG capsule TAKE 1 OR 2 CAPSULES BY MOUTH AT BEDTIME.  60 capsule  1  . omeprazole (PRILOSEC OTC) 20 MG tablet Take 20 mg by mouth daily.        . pravastatin (PRAVACHOL) 40 MG tablet Take 1 tablet (40 mg total) by mouth daily.  30 tablet  11  . vitamin B-12 (CYANOCOBALAMIN) 1000 MCG tablet Take 1,000 mcg by mouth daily.        Allergies  Allergen Reactions  . Doxazosin Mesylate     REACTION: dizzy  .  Methocarbamol     REACTION: RASH    Past Medical History  Diagnosis Date  . Dyspnea 2009 since July -Sept    05/06/08-CPST-  normal effort, reduced VO2 max 20.5 /65%, reduced at 8.2/ 40%, normal breathing resetvca of 55%, submaximal heart rate response 112/77%, flattened o2 pluse response at peak exercise-12 ml/beat @ 85%, No VQ mismatch abnormalities, All c/w CIRC Limitation  . Hyperlipidemia   . Hypertension   . Diabetes mellitus   . GERD (gastroesophageal reflux disease)   . Arthritis     osteoarthritis, s/p R TKR, and digits  . Overweight (BMI 25.0-29.9)     BMI 29  . Interstitial lung disease     NOS  . Pulmonary infiltrates 12-09    stable, 7/10: ? due to GERD  . Diverticulosis   . Hiatal hernia   . Esophageal stricture     s/p dilation spring 2010  . CAD (coronary artery disease)     symptoms  followed lopressor Jan-Jul 09 and lisinopril increase July 09, 1/09 Myoview- non ischemic with an EF of 51% and inferobasal wall scar, 10/09 Cath- severe native three-vessel coronary artery disease, s/p multivessel coronary bypass  graft with all grafts, Normal left ventricular systolic function with normal left ventricular filling pressures,   . History of PFTs     mixed pattern on spiro. mild restn on lung volumes with near normal DLCO. Pattern can be explained by CABG scar. Fev1 2.2L/73%, ratio 68 (67), TLC 4.7/68%,RV 1.5L/55%,DLCO 79%  . CAD (coronary artery disease)     09/04/08 ECHO- LVH, ef 60%, mild AS,01/2003-Adenosine cardiolite- thinning without ischemia EF 55%, 1/08 -Cardiolite -neg  . Colon polyps     Past Surgical History  Procedure Date  . Cholecystectomy 11/2005    [name]- open  . Coronary artery bypass graft 11/2000    x 5  . Cardiac catheterization 5/06    CP- no MI, Cath- small vessell disease  . Total knee arthroplasty 6/08    R knee- Riddle  . Total knee arthroplasty 12/11    Right- Dr Telford Nab    Family History  Problem Relation Age of Onset  . COPD Mother     . Heart disease Father   . Diabetes Brother   . Colon cancer Brother   . Alcohol abuse Sister     History   Social History  . Marital Status: Married    Spouse Name: N/A    Number of Children: N/A  . Years of Education: N/A   Occupational History  . lawn mower     retired   Social History Main Topics  . Smoking status: Former Smoker    Quit date: 04/06/1963  . Smokeless tobacco: Never Used  . Alcohol Use: No  . Drug Use: Not on file  . Sexually Active: Not on file   Other Topics Concern  . Not on file   Social History Narrative   Has children   Review of Systems Appetite is okay wieght is up 3# since last month--even with the diuresis last week and 2 lasix Generally sleeps okay     Objective:   Physical Exam  Constitutional: He appears well-developed and well-nourished. No distress.  Neck: Normal range of motion. Neck supple. JVD present.       ? Slightly elevated JVP  Pulmonary/Chest: Effort normal. No respiratory distress. He has no wheezes. He has rales.       Bibasilar crackles  Abdominal: Soft. There is no tenderness.  Musculoskeletal: He exhibits no edema and no tenderness.  Lymphadenopathy:    He has no cervical adenopathy.  Psychiatric: He has a normal mood and affect. His behavior is normal.          Assessment & Plan:

## 2011-09-14 NOTE — Patient Instructions (Signed)
Please weigh yourself each morning after your first morning bladder emptying Please take the furosemide every day if your weight at home is 220# or more Call if your home weight stays above 225#

## 2011-09-14 NOTE — Assessment & Plan Note (Signed)
Clearly had fluid overload Echo in January did confirm diastolic dysfunction Discussed weighing daily Will use the furosemide daily based on weight estimate

## 2011-10-05 ENCOUNTER — Encounter: Payer: Self-pay | Admitting: Internal Medicine

## 2011-10-05 ENCOUNTER — Ambulatory Visit (INDEPENDENT_AMBULATORY_CARE_PROVIDER_SITE_OTHER): Payer: Medicare Other | Admitting: Internal Medicine

## 2011-10-05 VITALS — BP 148/87 | HR 93 | Temp 97.4°F | Ht 73.0 in | Wt 223.8 lb

## 2011-10-05 DIAGNOSIS — R0602 Shortness of breath: Secondary | ICD-10-CM

## 2011-10-05 DIAGNOSIS — R0600 Dyspnea, unspecified: Secondary | ICD-10-CM

## 2011-10-05 NOTE — Progress Notes (Signed)
  Subjective:    Patient ID: [name], male    DOB: 1931/10/05, 78 y.o.   MRN: [id number]  HPI  OV 09/22/2009: [place] dyspnea due to ciriculatory limitation in remote smoker who quit 1960 and s/p CABG x 5 in 2002 and Known Mild Aortic Stenosis. Also with RLL >> LLL infiltrates in CT chest since October 2009. Last viis was August 2010. Since then dyspnea has improved spontaneously. He did not enrol in rehab due to cost issues but has been exxerciseing gently at homme and taking better care of his blood sugars. He is able to mow yard and climb > 1 flight of stairs without dyspnea. He had CT chest 61/06/2009 and this shows continued stability of infiltrates. There are no new complaints. Denies cough.   Patient Instructions: 1)  I am glad your shortness of breath is better 2)  Continue daily exercises 3)  BUy heart rate monitor and use that as a guide for exercise 4)  Your CT scan has been stable for 2 years 5)  No need for active followup 6)  Return if having new problems or anything goes bad 7)  Good luck  OV 10/05/2011 On 09/05/11 noticed abrupt dyspnea worsened from class 1 to 3. Also, edema. No chest pain, hemoptysis. So, 09/09/11 went to ER. Trop, BNP normal. No d-didimer. CXR reported as stable but am wondering if there is increased ILD. Given lasix and now edema better and dyspnea better and > 75% towads baseline  Walking desat test : resting 98/94% -> 3 laps x 185 feet; HR 113/pulse ox 96%   Past, Family, Social reviewed: no change since last visit in 2 year. No major changes to health in 2 years  Review of Systems  Constitutional: Negative for fever and unexpected weight change.  HENT: Negative for ear pain, nosebleeds, congestion, sore throat, rhinorrhea, sneezing, trouble swallowing, dental problem, postnasal drip and sinus pressure.   Eyes: Negative for redness and itching.  Respiratory: Positive for shortness of breath. Negative for cough, chest tightness and wheezing.     Cardiovascular: Negative for palpitations and leg swelling.  Gastrointestinal: Negative for nausea and vomiting.  Genitourinary: Negative for dysuria.  Musculoskeletal: Negative for joint swelling.  Skin: Negative for rash.  Neurological: Negative for headaches.  Hematological: Does not bruise/bleed easily.  Psychiatric/Behavioral: Negative for dysphoric mood. The patient is not nervous/anxious.        Objective:   Physical Exam Physical Exam  General:  normal appearance and obese.  Same as 2 years ago Head:  Normocephalic and atraumatic. Eyes:  PERRLA, no icterus. Nose:  Narrow nasal angles Mouth:  MP3, 2+ tonsills, poor dentition, retrognathic Neck:  no JVD, no thyromegaly Chest Wall:  Symmetrical,  no deformities . Lungs:  clear bilaterally to auscultation and percussion: CRACKLES + BOTTOM 1/3rd - I THINK THIS IS NEW COMPARED to BEFORE Heart:  regular rhythm and normal rate.  Grade 2 murmur in aortic area + Abdomen:  Soft, nontender and nondistended. Msk:  Symmetrical with no gross deformities. Normal posture. Pulses:  Normal pulses noted. Extremities:  No clubbing, cyanosis, edema or deformities noted. Neurologic:  CN II-XII grossly intact with normal reflexes, coordination, muscle strength and tone Skin:  Intact without significant lesions or rashes. Cervical Nodes:  No significant cervical adenopathy.no supraclavicular adenopathy Axillary Nodes:  no significant adenopathy Psych:  Alert and cooperative. Normal mood but mildly flat affect.       Assessment & Plan:

## 2011-10-05 NOTE — Patient Instructions (Addendum)
Do not know why exactly you are more short of breath The main difference I think we notice compared to 2 years ago is the slightly more crackling noise in lung but this is not showing up on walking test or xray Best you have breathing test called PFT Return to see me or NP or DR Halford Chessman  for results of PFt in next 4 weeks

## 2011-10-11 ENCOUNTER — Other Ambulatory Visit: Payer: Self-pay | Admitting: Internal Medicine

## 2011-10-12 ENCOUNTER — Ambulatory Visit (INDEPENDENT_AMBULATORY_CARE_PROVIDER_SITE_OTHER): Payer: Medicare Other | Admitting: Internal Medicine

## 2011-10-12 DIAGNOSIS — R0989 Other specified symptoms and signs involving the circulatory and respiratory systems: Secondary | ICD-10-CM

## 2011-10-12 DIAGNOSIS — R0600 Dyspnea, unspecified: Secondary | ICD-10-CM

## 2011-10-12 LAB — PULMONARY FUNCTION TEST

## 2011-10-12 NOTE — Progress Notes (Signed)
PFT done today. 

## 2011-10-16 ENCOUNTER — Encounter: Payer: Self-pay | Admitting: Internal Medicine

## 2011-10-16 NOTE — Assessment & Plan Note (Signed)
Do not know why exactly you are more short of breath The main difference I think we notice compared to 2 years ago is the slightly more crackling noise in lung but this is not showing up on walking test or xray Best you have breathing test called PFT Return to see me or NP or DR [name]  for results of PFt in next 4 weeks

## 2011-10-19 ENCOUNTER — Other Ambulatory Visit: Payer: Self-pay | Admitting: *Deleted

## 2011-10-19 MED ORDER — METFORMIN HCL 1000 MG PO TABS: 1000.0000 mg | ORAL_TABLET | Freq: Two times a day (BID) | ORAL | Status: DC

## 2011-11-02 ENCOUNTER — Ambulatory Visit (INDEPENDENT_AMBULATORY_CARE_PROVIDER_SITE_OTHER): Payer: Medicare Other | Admitting: Pulmonary Disease

## 2011-11-02 ENCOUNTER — Other Ambulatory Visit (INDEPENDENT_AMBULATORY_CARE_PROVIDER_SITE_OTHER): Payer: Medicare Other

## 2011-11-02 ENCOUNTER — Ambulatory Visit (INDEPENDENT_AMBULATORY_CARE_PROVIDER_SITE_OTHER)
Admission: RE | Admit: 2011-11-02 | Discharge: 2011-11-02 | Disposition: A | Discharge status: Home / Self Care | Payer: Medicare Other | Source: Ambulatory Visit | Attending: Pulmonary Disease | Admitting: Pulmonary Disease

## 2011-11-02 ENCOUNTER — Encounter: Payer: Self-pay | Admitting: Pulmonary Disease

## 2011-11-02 VITALS — BP 148/84 | HR 103 | Temp 97.5°F | Ht 74.0 in | Wt 223.2 lb

## 2011-11-02 DIAGNOSIS — R0609 Other forms of dyspnea: Secondary | ICD-10-CM

## 2011-11-02 DIAGNOSIS — R0600 Dyspnea, unspecified: Secondary | ICD-10-CM

## 2011-11-02 DIAGNOSIS — R0989 Other specified symptoms and signs involving the circulatory and respiratory systems: Secondary | ICD-10-CM

## 2011-11-02 DIAGNOSIS — R0602 Shortness of breath: Secondary | ICD-10-CM

## 2011-11-02 DIAGNOSIS — G4733 Obstructive sleep apnea (adult) (pediatric): Secondary | ICD-10-CM

## 2011-11-02 LAB — BASIC METABOLIC PANEL
Chloride: 99 mEq/L (ref 96–112)
Creatinine, Ser: 1.6 mg/dL — ABNORMAL HIGH (ref 0.4–1.5)
Potassium: 4.6 mEq/L (ref 3.5–5.1)

## 2011-11-02 MED ORDER — TIOTROPIUM BROMIDE MONOHYDRATE 18 MCG IN CAPS: 18.0000 ug | ORAL_CAPSULE | Freq: Every day | RESPIRATORY_TRACT | Status: DC

## 2011-11-02 MED ORDER — IOHEXOL 350 MG/ML SOLN: 100.0000 mL | Freq: Once | INTRAVENOUS | Status: AC | PRN

## 2011-11-02 NOTE — Patient Instructions (Signed)
Spiriva one puff daily Will schedule CT chest Follow up with Dr. Chase Caller in 2 to 3 weeks

## 2011-11-02 NOTE — Progress Notes (Signed)
Chief Complaint  Patient presents with  . Follow-up    w/ pft. breathing is better, c/o nasal congestion. denies any cough, wheezing, chest tx    History of Present Illness: [name] is a 78 y.o. male former smoker with dyspnea.  I previously saw [name] for his mild sleep apnea.  He has been followed by Dr. Chase Caller for his dyspnea.  He is here to review his PFT.  He continues to have trouble with dyspnea on exertion.  He tries to stay active, but this is limited.  He denies cough, sputum, wheeze, or chest tightness.  He tried advair before, but this didn't seem to help much.  He is not using any inhalers now.  He had mild fibrotic change and pleural thickening on CT chest in right lung from Sept 2003.  CT chest from Oct 2009 showed patchy GGO, mostly in Rt lower lobe which persisted on CT chest from Dec 2009 and June 2010.  He was also noted to have b/l bronchiectasis on CT chest from June 2011.  Past Medical History  Diagnosis Date  . Dyspnea 2009 since July -Sept    05/06/08-CPST-  normal effort, reduced VO2 max 20.5 /65%, reduced at 8.2/ 40%, normal breathing resetvca of 55%, submaximal heart rate response 112/77%, flattened o2 pluse response at peak exercise-12 ml/beat @ 85%, No VQ mismatch abnormalities, All c/w CIRC Limitation  . Hyperlipidemia   . Hypertension   . Diabetes mellitus   . GERD (gastroesophageal reflux disease)   . Arthritis     osteoarthritis, s/p R TKR, and digits  . Overweight (BMI 25.0-29.9)     BMI 29  . Interstitial lung disease     NOS  . Pulmonary infiltrates 12-09    stable, 7/10: ? due to GERD  . Diverticulosis   . Hiatal hernia   . Esophageal stricture     s/p dilation spring 2010  . CAD (coronary artery disease)     symptoms  followed lopressor Jan-Jul 09 and lisinopril increase July 09, 1/09 Myoview- non ischemic with an EF of 51% and inferobasal wall scar, 10/09 Cath- severe native three-vessel coronary artery disease, s/p multivessel  coronary bypass graft with all grafts, Normal left ventricular systolic function with normal left ventricular filling pressures,   . History of PFTs     mixed pattern on spiro. mild restn on lung volumes with near normal DLCO. Pattern can be explained by CABG scar. Fev1 2.2L/73%, ratio 68 (67), TLC 4.7/68%,RV 1.5L/55%,DLCO 79%  . CAD (coronary artery disease)     09/04/08 ECHO- LVH, ef 60%, mild AS,01/2003-Adenosine cardiolite- thinning without ischemia EF 55%, 1/08 -Cardiolite -neg  . Colon polyps     Past Surgical History  Procedure Date  . Cholecystectomy 11/2005    [name]- open  . Coronary artery bypass graft 11/2000    x 5  . Cardiac catheterization 5/06    CP- no MI, Cath- small vessell disease  . Total knee arthroplasty 6/08    R knee- [facility]iddle  . Total knee arthroplasty 12/11    Right- Dr Telford Nab    Outpatient Encounter Prescriptions as of 11/02/2011  Medication Sig Dispense Refill  . amLODipine (NORVASC) 10 MG tablet Take 1 tablet (10 mg total) by mouth daily.  30 tablet  11  . aspirin 81 MG tablet Take 81 mg by mouth daily.        . furosemide (LASIX) 20 MG tablet Take 1 tablet (20 mg total) by mouth daily as  needed. Take if weight at home over 220# in the morning  30 tablet  2  . gabapentin (NEURONTIN) 300 MG capsule TAKE 3 CAPSULES BY MOUTH TWICE A DAY  180 capsule  5  . losartan (COZAAR) 100 MG tablet Take 1 tablet (100 mg total) by mouth daily.  30 tablet  11  . metFORMIN (GLUCOPHAGE) 1000 MG tablet Take 1 tablet (1,000 mg total) by mouth 2 (two) times daily with a meal.  60 tablet  11  . mometasone (NASONEX) 50 MCG/ACT nasal spray Place 2 sprays into the nose daily as needed.      . Multiple Vitamins-Minerals (ICAPS) CAPS Take 1 capsule by mouth daily.        . nitroGLYCERIN (NITROSTAT) 0.4 MG SL tablet Place 1 tablet (0.4 mg total) under the tongue every 5 (five) minutes as needed. Chest pain  25 tablet  4  . nortriptyline (PAMELOR) 25 MG capsule TAKE 1 OR 2 CAPSULES BY  MOUTH AT BEDTIME.  60 capsule  1  . omeprazole (PRILOSEC OTC) 20 MG tablet Take 20 mg by mouth daily.        . pravastatin (PRAVACHOL) 40 MG tablet Take 1 tablet (40 mg total) by mouth daily.  30 tablet  11  . vitamin B-12 (CYANOCOBALAMIN) 1000 MCG tablet Take 1,000 mcg by mouth daily.        Allergies  Allergen Reactions  . Doxazosin Mesylate     REACTION: dizzy  . Methocarbamol     REACTION: RASH    Physical Exam:  Blood pressure 148/84, pulse 103, temperature 97.5 F (36.4 C), temperature source Oral, height 6\' 2"  (1.88 m), weight 223 lb 3.2 oz (101.243 kg), SpO2 95.00%.  Body mass index is 28.66 kg/(m^2). Wt Readings from Last 2 Encounters:  11/02/11 223 lb 3.2 oz (101.243 kg)  10/05/11 223 lb 12.8 oz (101.515 kg)    General - No distress ENT - TM clear, no sinus tenderness, no oral exudate, no LAN, no thyromegaly Cardiac - s1s2 regular, no murmur, pulses symmetric, no edema Chest - normal respiratory excursion, good air entry, no wheeze/rales/dullness Back - no focal tenderness Abd - soft, non-tender, no organomegaly, + bowel sounds Ext - normal motor strength Neuro - Cranial nerves are normal. PERLA. EOM's intact. Skin - no discernible active dermatitis, erythema, urticaria or inflammatory process. Psych - normal mood, and behavior.  PFT 10/12/11>>FEV1 2.02 (70%), FEV1% 72, TLC 4.07 (59%), DLCO 62%, no BD  Echo 05/04/11>>mild LVH, EF 55 to [id number], grade 1 diastolic dysfx, mild AS, mild MR, PAS 34 mmHg  Dg Chest 2 View  09/09/2011  *RADIOLOGY REPORT*  Clinical Data: Shortness of breath.  Feet swelling.  CHEST - 2 VIEW  Comparison: Chest x-ray 05/28/2011.  Findings: Lung volumes are normal.  No consolidative airspace disease.  No pleural effusions.  No pneumothorax.  No pulmonary nodule or mass noted. Linear opacities in the right base are unchanged compared to the prior examination, most compatible with an area of scarring.  Pulmonary vasculature and the cardiomediastinal  silhouette are within normal limits.  The patient is status post median sternotomy for CABG.  IMPRESSION: 1. No radiographic evidence of acute cardiopulmonary disease. 2.  Appearance of chest is unchanged compared to priors, as above.  Original Report Authenticated By: Etheleen Mayhew, M.D.   Lab Results  Component Value Date   CREATININE 1.6* 11/02/2011   BUN 31* 11/02/2011   NA 138 11/02/2011   K 4.6 11/02/2011   CL 99  11/02/2011   CO2 30 11/02/2011   Lab Results  Component Value Date   WBC 9.9 09/09/2011   HGB 12.3* 09/09/2011   HCT 36.3* 09/09/2011   MCV 83.1 09/09/2011   PLT 214 09/09/2011   Lab Results  Component Value Date   ALT 18 09/09/2011   AST 21 09/09/2011   ALKPHOS 56 09/09/2011   BILITOT 0.4 09/09/2011   BNP    Component Value Date/Time   PROBNP 169.4 09/09/2011 1823   Lab Results  Component Value Date   ESRSEDRATE 17* 03/19/2008    Assessment/Plan:  Chesley Mires, MD [place] [facility] Pager:  [phone] 11/02/2011, 11:20 AM

## 2011-11-02 NOTE — Assessment & Plan Note (Signed)
He has history of mild sleep apnea. He does not feel that his sleep is a major problem at present.  I have asked him to monitor his sleep pattern.  He can follow up with me as needed if his sleep becomes more of an issue.

## 2011-11-02 NOTE — Assessment & Plan Note (Signed)
He has progressive dyspnea.  He has history of smoking.  He has history of GGO, and BTX on prior CT chests.  He has borderline obstruction, restriction, and diffusion defect on PFT.  There is nothing in his exam to suggest respiratory muscle weakness.  Will give him trial of spiriva.  Will arrange for high resolution CT chest with IV contrast.  I will have him follow up with Dr. Chase Caller to assess further.

## 2011-11-03 ENCOUNTER — Telehealth: Payer: Self-pay | Admitting: Pulmonary Disease

## 2011-11-03 NOTE — Telephone Encounter (Signed)
Ct Angio Chest W/cm &/or Wo Cm  11/02/2011  *RADIOLOGY REPORT*  Clinical Data:  Dyspnea, restrictive defect on pulmonary function tests, history of smoking, rule out pulmonary embolus  CT ANGIOGRAPHY CHEST  Technique:  Multidetector CT imaging of the chest using the standard protocol during bolus administration of intravenous contrast. Multiplanar reconstructed images including MIPs were obtained and reviewed to evaluate the vascular anatomy.  Contrast:  Comparison: Chest x-ray 09/09/2011, most recent CT scan of the chest 09/15/2009  Findings:  Mediastinum: Normal thyroid gland and thoracic inlet.  No suspicious mediastinal or hilar adenopathy.  Thoracic esophagus is within normal limits.  Heart/Vascular: The pulmonary arteries are well opacified to the segmental level.  There is no central filling defect to suggest acute pulmonary embolism.  No intravascular webs to suggest chronic pulmonary embolism.  The main pulmonary artery is within normal limits for size.  There is a bovine configuration of the aortic arch (two vessel arch with common origin of the brachiocephalic and left common carotid arteries), a normal anatomic variant.  The thoracic aorta is slightly tortuous, and mildly ectatic measuring up to 3.4 cm in diameter.  The heart is within normal limits for size, however the left ventricular myocardium is concentrically hypertrophied. Extensive calcification of the aortic valve leaflets.  No pericardial effusion.  Surgical changes of prior median sternotomy and multivessel CABG.  There is a left internal mammary artery to LAD bypass graft - the patency of the graft cannot be assessed secondary to pulmonary arterial phase timing. Extensive atherosclerotic calcification of the native coronary arteries.  Lungs/Pleura: No pulmonary edema, or focal airspace consolidation. No significant emphysema.  There is diffuse bronchial wall thickening, most prominently in the bilateral lower lobes of lung with some mild  cylindrical bronchiectasis in the posterior basal segments of the lower lobes slightly more prominent on the right than the left.  Mild dependent atelectasis.  2.5 mm dense for size subpleural nodule in the right middle lobe likely reflects a small granuloma, or focus of inspissated secretions.  This is below the size threshold for imaging followup.  Upper Abdomen: Visualized upper abdominal organs are unremarkable.  Bones: No acute fracture or aggressive appearing lytic or blastic osseous lesion.   Well healed median sternotomy.  Multilevel degenerative disc disease and hypertrophic changes of the spinous processes.  IMPRESSION:  1.   Negative for acute pulmonary embolism. No imaging findings to suggest chronic pulmonary embolism.  2.  Mild bronchial wall thickening and cylindrical bronchiectasis in the right greater than left lower lobes which can be seen in chronic bronchitis, asthma, and occasionally chronic subclinical aspiration.  3.  Extensive calcification of the aortic valve leaflets.  Combined with concentric hypertrophy of the left ventricular myocardium, these findings raise concern for aortic valvular stenosis.  If clinically warranted, further evaluation with echo cardiography may be useful.  Original Report Authenticated By: [place] with pt over phone.  Advised to continue prescribed inhaler regimen from ROV.  Will forward to Dr. Chase Caller so he can discuss in more detail at next [facility]  Pt has also asked I forward results to Dr. Johnsie Cancel for his review prior to [facility] in August.

## 2011-11-08 ENCOUNTER — Other Ambulatory Visit: Payer: Self-pay | Admitting: *Deleted

## 2011-11-08 MED ORDER — NORTRIPTYLINE HCL 25 MG PO CAPS: 25.0000 mg | ORAL_CAPSULE | Freq: Every day | ORAL | Status: DC

## 2011-11-08 NOTE — Telephone Encounter (Signed)
Thanks. Seeing him 11/22/11

## 2011-11-09 ENCOUNTER — Encounter: Payer: Self-pay | Admitting: Cardiovascular Disease

## 2011-11-09 ENCOUNTER — Ambulatory Visit (INDEPENDENT_AMBULATORY_CARE_PROVIDER_SITE_OTHER): Payer: Medicare Other | Admitting: Cardiovascular Disease

## 2011-11-09 VITALS — BP 164/93 | HR 87 | Ht 73.0 in | Wt 222.0 lb

## 2011-11-09 DIAGNOSIS — I2510 Atherosclerotic heart disease of native coronary artery without angina pectoris: Secondary | ICD-10-CM

## 2011-11-09 DIAGNOSIS — R0602 Shortness of breath: Secondary | ICD-10-CM

## 2011-11-09 DIAGNOSIS — I10 Essential (primary) hypertension: Secondary | ICD-10-CM

## 2011-11-09 LAB — BASIC METABOLIC PANEL
CO2: 26 mEq/L (ref 19–32)
Calcium: 9.6 mg/dL (ref 8.4–10.5)
Sodium: 137 mEq/L (ref 135–145)

## 2011-11-09 LAB — BRAIN NATRIURETIC PEPTIDE: Pro B Natriuretic peptide (BNP): 25 pg/mL (ref 0.0–100.0)

## 2011-11-09 NOTE — Assessment & Plan Note (Signed)
Cholesterol is at goal.  Continue current dose of statin and diet Rx.  No myalgias or side effects.  F/U  LFT's in 6 months. Lab Results  Component Value Date   LDLCALC 83 08/17/2011

## 2011-11-09 NOTE — Progress Notes (Signed)
Patient ID: [name], male   DOB: 1931-04-09, 78 y.o.   MRN: [id number] [name] is seen today for F/U of CAD with ischemic DCM EF around 35% in past but most recent echo 12/10 normal. 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 He has had strictures and multiple endoscopy's. He has had a distant CABG and cath 01/2008 showed widely patent grafts and normal EF. I think his dyspnea is more related to bronchiectasis and interstitial lung disease as documented by CT. he has been compliant with his meds He also has mild AS on recent echo and should probably have another one 03/2011.  Been ill since coming back from beach a week or two ago. Had nausea and vohmiting with SSCP. Mild cough. CXR with bronchiectasis no CHF BNP normal ECG in office today NSR rate 80 LAFB old IMI no acute chanes  Seen in ER recently ? CHF  Given lasix Still with some nocturnal frequency and LE edema.  No chest pain  ROS: Denies fever, malais, weight loss, blurry vision, decreased visual acuity, cough, sputum, SOB, hemoptysis, pleuritic pain, palpitaitons, heartburn, abdominal pain, melena, lower extremity edema, claudication, or rash.  All other systems reviewed and negative  General: Affect appropriate Healthy:  appears stated age 32: normal Neck supple with no adenopathy JVP normal no bruits no thyromegaly Lungs clear with no wheezing and good diaphragmatic motion Heart:  S1/S2 no murmur, no rub, gallop or click PMI normal Abdomen: benighn, BS positve, no tenderness, no AAA no bruit.  No HSM or HJR Distal pulses intact with no bruits Plus one bilateral  edema Neuro non-focal Skin warm and dry No muscular weakness   Current Outpatient Prescriptions  Medication Sig Dispense Refill  . amLODipine (NORVASC) 10 MG tablet Take 1 tablet (10 mg total) by mouth daily.  30 tablet  11  . aspirin 81 MG tablet Take 81 mg by mouth daily.        . furosemide (LASIX) 20  MG tablet Take 1 tablet (20 mg total) by mouth daily as needed. Take if weight at home over 220# in the morning  30 tablet  2  . gabapentin (NEURONTIN) 300 MG capsule TAKE 3 CAPSULES BY MOUTH TWICE A DAY  180 capsule  5  . losartan (COZAAR) 100 MG tablet Take 1 tablet (100 mg total) by mouth daily.  30 tablet  11  . metFORMIN (GLUCOPHAGE) 1000 MG tablet Take 1 tablet (1,000 mg total) by mouth 2 (two) times daily with a meal.  60 tablet  11  . mometasone (NASONEX) 50 MCG/ACT nasal spray Place 2 sprays into the nose daily as needed.      . Multiple Vitamins-Minerals (ICAPS) CAPS Take 1 capsule by mouth daily.        . nitroGLYCERIN (NITROSTAT) 0.4 MG SL tablet Place 1 tablet (0.4 mg total) under the tongue every 5 (five) minutes as needed. Chest pain  25 tablet  4  . nortriptyline (PAMELOR) 25 MG capsule Take 1-2 capsules (25-50 mg total) by mouth at bedtime.  60 capsule  1  . omeprazole (PRILOSEC OTC) 20 MG tablet Take 20 mg by mouth daily.        . pravastatin (PRAVACHOL) 40 MG tablet Take 1 tablet (40 mg total) by mouth daily.  30 tablet  11  . tiotropium (SPIRIVA) 18 MCG inhalation capsule Place 1 capsule (18 mcg total) into inhaler and inhale daily.  30 capsule  5  .  vitamin B-12 (CYANOCOBALAMIN) 1000 MCG tablet Take 1,000 mcg by mouth daily.        Allergies  Doxazosin mesylate and Methocarbamol  Electrocardiogram:09/13/11  SR rate 100 nonspecific ST/T wave changes  Assessment and Plan

## 2011-11-09 NOTE — Assessment & Plan Note (Signed)
Well controlled.  Continue current medications and low sodium Dash type diet.    

## 2011-11-09 NOTE — Assessment & Plan Note (Signed)
CHF with known ischemic DCM.  Continue lasix.  Check BMET and BNP.  Also component of lung disease with bronchiectasis.  Reevaluate in 3-4 weeks May need right and left heart cath.

## 2011-11-09 NOTE — Patient Instructions (Signed)
Your physician recommends that you schedule a follow-up appointment in: 3-4 weeks with DR [facility] Your physician recommends that you continue on your current medications as directed. Please refer to the Current Medication list given to you today. Your physician recommends that you return for lab work in: TODAY  BMET  BNP  DX 401.1 785.06 V58.69

## 2011-11-10 ENCOUNTER — Ambulatory Visit (INDEPENDENT_AMBULATORY_CARE_PROVIDER_SITE_OTHER): Payer: Medicare Other | Admitting: Family Medicine

## 2011-11-10 ENCOUNTER — Encounter: Payer: Self-pay | Admitting: Family Medicine

## 2011-11-10 VITALS — BP 142/78 | HR 82 | Temp 97.5°F | Wt 225.0 lb

## 2011-11-10 DIAGNOSIS — L723 Sebaceous cyst: Secondary | ICD-10-CM

## 2011-11-10 DIAGNOSIS — L720 Epidermal cyst: Secondary | ICD-10-CM

## 2011-11-10 MED ORDER — DOXYCYCLINE HYCLATE 100 MG PO CAPS: 100.0000 mg | ORAL_CAPSULE | Freq: Two times a day (BID) | ORAL | Status: AC

## 2011-11-10 NOTE — Progress Notes (Signed)
  Subjective:    Patient ID: [name], male    DOB: 11-23-1931, 78 y.o.   MRN: [id number]  HPI CC: skin rash on back  No h/o shingles in past.  Did not receive zostavax.  4d h/o rash on back, lower right back, tender to touch and with shirt.  Hasn't tried anything on it.  No fevers/chills, oral lesions, other skin rashes, n/v, joint pains.  Review of Systems Per HPI    Objective:   Physical Exam WDWN CM, NAD Right lower back laterally with erythematous indurated lesion. Draining on its own.  Able to express small amt pus.  No significant fluctuance.    Assessment & Plan:

## 2011-11-10 NOTE — Assessment & Plan Note (Signed)
Anticipate infected cyst.  As already draining some on own, did not perform I&D today. placed on doxy, discussed precautions to return for I&D. Aggressive warm compresses.

## 2011-11-10 NOTE — Patient Instructions (Signed)
I think you have an infected epidermal cyst. Treat with aggressive warm compresses and antibiotic (sent to pharmacy). Doxycycline twice daily sent to pharmacy for 10 days. If area coming to a head, or worsening redness, pain, please return for incision and drainage. Hopefully warm compress and antibiotics will help resolve without cutting.

## 2011-11-11 ENCOUNTER — Other Ambulatory Visit: Payer: Self-pay | Admitting: Internal Medicine

## 2011-11-22 ENCOUNTER — Other Ambulatory Visit (INDEPENDENT_AMBULATORY_CARE_PROVIDER_SITE_OTHER): Payer: Medicare Other

## 2011-11-22 ENCOUNTER — Encounter: Payer: Self-pay | Admitting: Internal Medicine

## 2011-11-22 ENCOUNTER — Ambulatory Visit (INDEPENDENT_AMBULATORY_CARE_PROVIDER_SITE_OTHER): Payer: Medicare Other | Admitting: Internal Medicine

## 2011-11-22 VITALS — BP 130/76 | HR 101 | Temp 98.5°F | Ht 73.0 in | Wt 228.8 lb

## 2011-11-22 DIAGNOSIS — R0602 Shortness of breath: Secondary | ICD-10-CM

## 2011-11-22 DIAGNOSIS — J8410 Pulmonary fibrosis, unspecified: Secondary | ICD-10-CM

## 2011-11-22 DIAGNOSIS — J849 Interstitial pulmonary disease, unspecified: Secondary | ICD-10-CM

## 2011-11-22 NOTE — Progress Notes (Signed)
Subjective:    Patient ID: [name], male    DOB: 04-28-1931, 78 y.o.   MRN: [id number]  HPI #[id number] - since July-Sept 2009  - Coronary artery disease. s/p CABG x 5 in 2002.  Dyspnea started following lopressor Jan-July 2009 and lisinopril increae July 2009. No relief despite stopping agents July 2010  -   Myoview on April 17, 2007 was  nonischemic with an EF of 51% and inferobasal wall scar.   - CAth Oct 2009:    - . Severe native three-vessel coronary artery disease. 2. Status post multivessel coronary bypass surgery with all grafts  patent. Normal LVEF and LVEDP - PFT - Mixed pattern on spiro. Mild restn on lung volumeswith near normal DLCO.   - Fev1 2.2L/73%, FVC 3.21L/70%, Ratio 68 (67),  TLC 4.7L/68%, RV 1.5L/55%, DLCO 79%.  -CPST 05/06/2008: Normal effot.    - Reduced VO2 max 20.5/65%, REduced AT 8.2/40%, Normal breathing reservce of 55%, submaximal heart rate response 112/77%,  flattened O2 pulse response at peak exercise - 39ml/beat at 85%. No VQ mismatch abnormalities. - ECHO 09/04/2008: LVH, ef 60%, mild AS and unchanged from prior   - CT chest   - Non specific Interstitial Lung disease NOS. Stable pulm infilratates RLL ggo > LLL ggo. 03/2008 -> 10/2008 -> June 2011: stable - Rehab: never attended 2009-2013 due to cost   OV 10/05/2011 Last seen September 22, 2009 and discharged from followup. He never attended rehab due to cost issues. Now reports that on 09/05/11 noticed abrupt dyspnea worsened from class 1 to 3. Also, edema. No chest pain, hemoptysis. So, 09/09/11 went to ER. Trop, BNP normal. No d-didimer. CXR reported as stable but am wondering if there is increased ILD. Given lasix and now edema better and dyspnea better and > 75% towads baseline  Walking desat test : resting 98/94% -> 3 laps x 185 feet; HR 113/pulse ox 96%   Past, Family, Social reviewed: no change since last visit in 2 year. No major changes to health in 2 years  REC Do not know why exactly you are more short  of breath The main difference I think we notice compared to 2 years ago is the slightly more crackling noise in lung but this is not showing up on walking test or xray Best you have breathing test called PFT Return to see me or NP or DR Halford Chessman  for results of PFt in next 4 weeks   OV 11/22/2011 Followup test results: Dyspnea is unchanged since last. IN review of tests: PFTs July 2013 show worsening restriction since 2010 along with reduced diffusion. The RLL baseline GGO might be worse in July 2013 compared to 2011. In addition, autommune profile shows VERY HIGH TITERS of CCP antibody > 300 which is pathognomonic of rheumtoid arthritis lung involvement. RF is only borderline elevated a 23   PFT 10/12/11>>FEV1 2.02 (70%), FEV1% 72, TLC 4.07 (59%), DLCO 62%, no BD: RESTRICTION - WORSE since 2010 -(2010:  Fev1 2.2L/73%, FVC 3.21L/70%, Ratio 68 (67),  TLC 4.7L/68%, RV 1.5L/55%, DLCO 79%)   Echo 05/04/11>>mild LVH, EF 55 to [id number], grade 1 diastolic dysfx, mild AS, mild MR, PAS 34 mmHg  CT 11/02/11 - calcification suspicious for aortic valve and some GGO esp RLL > LLL (? RLL worse compared to 2011)   Past, Family, Social reviewed: new problem is that  rT flank and RUQ pain. No associated weight loss but has gained. Come and goes. Mild pain. Unclear what aggravating factors  and relieving factors. Annoying quality.    Review of Systems  Constitutional: Negative for fever, chills, diaphoresis, activity change, appetite change, fatigue and unexpected weight change.  HENT: Negative for ear pain, nosebleeds, congestion, sore throat, facial swelling, rhinorrhea, sneezing, mouth sores, trouble swallowing, postnasal drip, sinus pressure and tinnitus.   Eyes: Negative for visual disturbance.  Respiratory: Positive for cough and wheezing. Negative for choking, chest tightness and shortness of breath.   Cardiovascular: Negative for chest pain and palpitations.  Gastrointestinal: Positive for abdominal pain.  Negative for nausea, vomiting, constipation, blood in stool and abdominal distention.  Genitourinary: Negative for difficulty urinating.  Musculoskeletal: Negative for gait problem.  Skin: Negative for pallor and rash.  Neurological: Negative for dizziness, weakness, light-headedness, numbness and headaches.  Hematological: Does not bruise/bleed easily.  Psychiatric/Behavioral: Negative for confusion, disturbed wake/sleep cycle and agitation. The patient is not nervous/anxious.        Objective:   Physical Exam Physical Exam  General:  normal appearance and obese.  Same as 2 years ago Head:  Normocephalic and atraumatic. Eyes:  PERRLA, no icterus. Nose:  Narrow nasal angles Mouth:  MP3, 2+ tonsills, poor dentition, retrognathic Neck:  no JVD, no thyromegaly Chest Wall:  Symmetrical,  no deformities . Lungs:  clear bilaterally to auscultation and percussion: CRACKLES + BOTTOM 1/3rd - I THINK THIS IS NEW COMPARED to 2011 Heart:  regular rhythm and normal rate.   Cardiac: Grade 2 murmur in aortic area + Abdomen:  Soft, nontender and nondistended. Msk:  Symmetrical with no gross deformities. Normal posture. Pulses:  Normal pulses noted.       Assessment & Plan:

## 2011-11-22 NOTE — Patient Instructions (Addendum)
Have blood work today related to lung disease I will talk to Dr Johnsie Cancel I will call you back after results are in and after talking to Dr Johnsie Cancel   ... Update - see phone note 11/26/11. I will send it to Dr Johnsie Cancel

## 2011-11-23 ENCOUNTER — Telehealth: Payer: Self-pay | Admitting: Cardiovascular Disease

## 2011-11-23 ENCOUNTER — Telehealth: Payer: Self-pay | Admitting: *Deleted

## 2011-11-23 LAB — ANA: Anti Nuclear Antibody(ANA): NEGATIVE

## 2011-11-23 LAB — ANCA SCREEN W REFLEX TITER
Atypical p-ANCA Screen: NEGATIVE
c-ANCA Screen: NEGATIVE

## 2011-11-23 LAB — ANGIOTENSIN CONVERTING ENZYME: Angiotensin-Converting Enzyme: 20 U/L (ref 8–52)

## 2011-11-23 LAB — CYCLIC CITRUL PEPTIDE ANTIBODY, IGG: Cyclic Citrullin Peptide Ab: >300 U/mL — ABNORMAL HIGH (ref 0.0–5.0)

## 2011-11-23 LAB — RHEUMATOID FACTOR: Rheumatoid fact SerPl-aCnc: 23 [IU]/mL — ABNORMAL HIGH

## 2011-11-23 NOTE — Telephone Encounter (Signed)
Calling patient to see if he will be using [employer] for his diabetes supplies, .left message to have patient return my call. Form on my desk

## 2011-11-23 NOTE — Telephone Encounter (Signed)
PT AWARE OF LAB RESULTS./CY 

## 2011-11-23 NOTE — Telephone Encounter (Signed)
Fu call °Patient returning your call °

## 2011-11-24 LAB — ANTI-SCLERODERMA ANTIBODY: Scleroderma (Scl-70) (ENA) Antibody, IgG: 1 AU/mL (ref <30)

## 2011-11-25 ENCOUNTER — Telehealth: Payer: Self-pay | Admitting: Internal Medicine

## 2011-11-25 DIAGNOSIS — J849 Interstitial pulmonary disease, unspecified: Secondary | ICD-10-CM

## 2011-11-25 DIAGNOSIS — M069 Rheumatoid arthritis, unspecified: Secondary | ICD-10-CM

## 2011-11-25 NOTE — Telephone Encounter (Signed)
MR spoke with the patient. [name], CMA

## 2011-11-25 NOTE — Telephone Encounter (Signed)
Autoimmune test results: RF and CCP antibodies positive. Esp CCP strongly positive. 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 Dyspnea is from this +/- joint pain that he might be having. He needs immune modulators like prednisone or cell cept  I called to give him this news but left message with grandaughter to call back office.   I have ordered rheum referral

## 2011-11-28 DIAGNOSIS — J849 Interstitial pulmonary disease, unspecified: Secondary | ICD-10-CM | POA: Insufficient documentation

## 2011-11-28 NOTE — Telephone Encounter (Signed)
Nish - He has rheumatoid arthritis; showed up on autoimmune testing. IThe subtle ILD in lung is from this. This in addition to possible general fatigue from the RA and ? Joint issues likely contributing to dyspnea. PFTs worse compared to 3 years ago. FYI for you  Delsa Sale - please ensure rheum appt. GIve me fu in 2 months but has to be after rheum visit

## 2011-11-28 NOTE — Assessment & Plan Note (Signed)
Restrictive PFT with low dlco. CT chest RLL > LLL ggo. Both findings not significant enough to make him desaturate walking 185 feet x 3 laps Anti CCP SKY HIGH at 300s titer - c/w Rheumatoid Arthritis ILD  Plan Refer rheumatology

## 2011-11-28 NOTE — Assessment & Plan Note (Signed)
Clearly what is worse compared to few years ago is his restrictive lung disease and the rLL ggo. Serum work c/w RA related ILD. This is definitely contributing to dyspnea. Dyspnea though is out of proporton to findings and ? If there is RA relaed fatigue and joint issues making him dyspneic. Deconditoning likely element as well but he has never been able to afford rehab. We will see what rehum says and then decide about rehab referral

## 2011-11-29 LAB — HYPERSENSITIVITY PNUEMONITIS PROFILE

## 2011-11-29 NOTE — Telephone Encounter (Signed)
[facility] has faxed request to Dr. Anette Guarneri office and are awaiting a call back for the appt. I will place a reminder to schedule the pt an appt 2 months after rheum appt.[place], CMA

## 2011-11-30 NOTE — Telephone Encounter (Signed)
.  left message to have patient return my call.  

## 2011-12-03 NOTE — Telephone Encounter (Signed)
.  left message to have patient return my call.  

## 2011-12-13 ENCOUNTER — Encounter: Payer: Self-pay | Admitting: Cardiovascular Disease

## 2011-12-13 ENCOUNTER — Ambulatory Visit (INDEPENDENT_AMBULATORY_CARE_PROVIDER_SITE_OTHER): Payer: Medicare Other | Admitting: Cardiovascular Disease

## 2011-12-13 VITALS — BP 152/80 | HR 92 | Ht 73.0 in | Wt 229.0 lb

## 2011-12-13 DIAGNOSIS — R609 Edema, unspecified: Secondary | ICD-10-CM

## 2011-12-13 DIAGNOSIS — I359 Nonrheumatic aortic valve disorder, unspecified: Secondary | ICD-10-CM

## 2011-12-13 DIAGNOSIS — Z79899 Other long term (current) drug therapy: Secondary | ICD-10-CM

## 2011-12-13 DIAGNOSIS — R0602 Shortness of breath: Secondary | ICD-10-CM

## 2011-12-13 LAB — BASIC METABOLIC PANEL
Calcium: 9.4 mg/dL (ref 8.4–10.5)
GFR: 47.7 mL/min — ABNORMAL LOW (ref >60.00)
Potassium: 3.6 mEq/L (ref 3.5–5.1)
Sodium: 138 mEq/L (ref 135–145)

## 2011-12-13 MED ORDER — NITROGLYCERIN 0.4 MG SL SUBL: 0.4000 mg | SUBLINGUAL_TABLET | SUBLINGUAL | Status: DC | PRN

## 2011-12-13 NOTE — Assessment & Plan Note (Signed)
Mild should not be causing dyspnea echo 1/14

## 2011-12-13 NOTE — Assessment & Plan Note (Signed)
Well controlled.  Continue current medications and low sodium Dash type diet.    

## 2011-12-13 NOTE — Assessment & Plan Note (Signed)
Chronic  More lung related.  F/U Ramaswhamy.  Check BMET and BNP  Does not need right heart cath at this time

## 2011-12-13 NOTE — Assessment & Plan Note (Signed)
Stable with no angina and good activity level.  Continue medical Rx  

## 2011-12-13 NOTE — Assessment & Plan Note (Signed)
Cholesterol is at goal.  Continue current dose of statin and diet Rx.  No myalgias or side effects.  F/U  LFT's in 6 months. Lab Results  Component Value Date   LDLCALC 83 08/17/2011

## 2011-12-13 NOTE — Progress Notes (Signed)
Patient ID: [name], male   DOB: [birth year], 78 y.o.   MRN: [id number] 78 y.o. with distant CABG in 2002 with chronic dyspnea.  Extensive w/u without significant findings   EF around 35% in past but most recent echo 12/10 normal. 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 He has had strictures and multiple endoscopy's. He has had a distant CABG and cath 01/2008 showed widely patent grafts and normal EF. I think his dyspnea is more related to bronchiectasis and interstitial lung disease as documented by CT. he has been compliant with his meds He also has mild AS on recent echo and should probably have another one 03/2011.    - Coronary artery disease. s/p CABG x 5 in 2002. Dyspnea started following lopressor Jan-July 2009 and lisinopril increae July 2009. No relief despite stopping agents July 2010  - Myoview on April 17, 2007 was nonischemic with an EF of 51% and inferobasal wall scar.  - CAth Oct 2009:  - . Severe native three-vessel coronary artery disease. 2. Status post multivessel coronary bypass surgery with all grafts patent. Normal LVEF and LVEDP  - PFT - Mixed pattern on spiro. Mild restn on lung volumeswith near normal DLCO.  - Fev1 2.2L/73%, FVC 3.21L/70%, Ratio 68 (67), TLC 4.7L/68%, RV 1.5L/55%, DLCO 79%.  -CPST 05/06/2008: Normal effot.  - Reduced VO2 max 20.5/65%, REduced AT 8.2/40%, Normal breathing reservce of 55%, submaximal heart rate response 112/77%, flattened O2 pulse response at peak exercise - 48ml/beat at 85%. No VQ mismatch abnormalities.  - ECHO 09/04/2008: LVH, ef 60%, mild AS and unchanged from prior  - CT chest  - Non specific Interstitial Lung disease NOS. Stable pulm infilratates RLL ggo > LLL ggo. 03/2008 -> 10/2008 -> June 2011: stable  - Rehab: never attended 2009-2013 due to cost  ROS: Denies fever, malais, weight loss, blurry vision, decreased visual acuity, cough, sputum, SOB, hemoptysis, pleuritic pain,  palpitaitons, heartburn, abdominal pain, melena, lower extremity edema, claudication, or rash.  All other systems reviewed and negative  General: Affect appropriate Healthy:  appears stated age 78: normal Neck supple with no adenopathy JVP normal no bruits no thyromegaly Lungs end expitory  wheezing and good diaphragmatic motion Heart:  S1/S2 mild AS  murmur, no rub, gallop or click PMI normal Abdomen: benighn, BS positve, no tenderness, no AAA no bruit.  No HSM or HJR Distal pulses intact with no bruits Plus one bilateral edema Neuro non-focal Skin warm and dry No muscular weakness   Current Outpatient Prescriptions  Medication Sig Dispense Refill  . amLODipine (NORVASC) 10 MG tablet Take 1 tablet (10 mg total) by mouth daily.  30 tablet  11  . aspirin 81 MG tablet Take 81 mg by mouth daily.        . furosemide (LASIX) 20 MG tablet TAKE 1 TABLET EVERY DAY AS NEEDED FOR LEG SWELLING  30 tablet  11  . gabapentin (NEURONTIN) 300 MG capsule TAKE 3 CAPSULES BY MOUTH TWICE A DAY  180 capsule  5  . losartan (COZAAR) 100 MG tablet Take 1 tablet (100 mg total) by mouth daily.  30 tablet  11  . metFORMIN (GLUCOPHAGE) 1000 MG tablet Take 1 tablet (1,000 mg total) by mouth 2 (two) times daily with a meal.  60 tablet  11  . mometasone (NASONEX) 50 MCG/ACT nasal spray Place 2 sprays into the nose daily as needed.      . Multiple Vitamins-Minerals (ICAPS)  CAPS Take 1 capsule by mouth daily.        . nitroGLYCERIN (NITROSTAT) 0.4 MG SL tablet Place 1 tablet (0.4 mg total) under the tongue every 5 (five) minutes as needed. Chest pain  25 tablet  4  . nortriptyline (PAMELOR) 25 MG capsule Take 1-2 capsules (25-50 mg total) by mouth at bedtime.  60 capsule  1  . omeprazole (PRILOSEC OTC) 20 MG tablet Take 20 mg by mouth daily.        . pravastatin (PRAVACHOL) 40 MG tablet Take 1 tablet (40 mg total) by mouth daily.  30 tablet  11  . tiotropium (SPIRIVA) 18 MCG inhalation capsule Place 1 capsule  (18 mcg total) into inhaler and inhale daily.  30 capsule  5  . vitamin B-12 (CYANOCOBALAMIN) 1000 MCG tablet Take 1,000 mcg by mouth daily.        Allergies  Doxazosin mesylate and Methocarbamol  Electrocardiogram:  6/13  SR rate 101 otherwise normal  Assessment and Plan

## 2011-12-13 NOTE — Patient Instructions (Signed)
Your physician recommends that you schedule a follow-up appointment in: [place] physician recommends that you continue on your current medications as directed. Please refer to the Current Medication list given to you today. Your physician recommends that you return for lab work in: TODAY BMET BNP  DX V58.69   [facility] DUE FOR ECHO IN JAN  2014

## 2011-12-16 ENCOUNTER — Telehealth: Payer: Self-pay | Admitting: Cardiovascular Disease

## 2011-12-16 NOTE — Telephone Encounter (Signed)
LMTCB ./CY 

## 2011-12-16 NOTE — Telephone Encounter (Signed)
New Problem:    Patient called in returning your call. Please call back. 

## 2011-12-17 ENCOUNTER — Telehealth: Payer: Self-pay | Admitting: *Deleted

## 2011-12-17 NOTE — Telephone Encounter (Signed)
Notes Recorded by [name], MD on 12/13/2011 at 4:09 PM Labs reviewed and are normal. No changes in medication or F/U needed. BS too high F/U with primary not fasting lab.  12/17/11--Spoke with pt and gave him lab results. I made him aware of high BS. He states he had eaten an hour or so before this lab was drawn.He states it has not been this high. He is aware he should F/U with primary if it is high when he checks his BS.

## 2011-12-17 NOTE — Telephone Encounter (Signed)
F/U  Returning [place] call.

## 2011-12-31 ENCOUNTER — Emergency Department (HOSPITAL_COMMUNITY): Payer: Medicare Other

## 2011-12-31 ENCOUNTER — Encounter (HOSPITAL_COMMUNITY): Payer: Self-pay

## 2011-12-31 ENCOUNTER — Inpatient Hospital Stay (HOSPITAL_COMMUNITY)
Admission: EM | Admit: 2011-12-31 | Discharge: 2012-01-04 | DRG: 247 | Disposition: A | Discharge status: Home / Self Care | Payer: Medicare Other | Attending: Cardiovascular Disease | Admitting: Cardiovascular Disease

## 2011-12-31 ENCOUNTER — Encounter (HOSPITAL_COMMUNITY)
Admission: EM | Disposition: A | Discharge status: Home / Self Care | Payer: Self-pay | Source: Home / Self Care | Attending: Cardiovascular Disease

## 2011-12-31 DIAGNOSIS — I2510 Atherosclerotic heart disease of native coronary artery without angina pectoris: Secondary | ICD-10-CM

## 2011-12-31 DIAGNOSIS — R0600 Dyspnea, unspecified: Secondary | ICD-10-CM

## 2011-12-31 DIAGNOSIS — E785 Hyperlipidemia, unspecified: Secondary | ICD-10-CM | POA: Diagnosis present

## 2011-12-31 DIAGNOSIS — Z955 Presence of coronary angioplasty implant and graft: Secondary | ICD-10-CM

## 2011-12-31 DIAGNOSIS — G4733 Obstructive sleep apnea (adult) (pediatric): Secondary | ICD-10-CM | POA: Diagnosis present

## 2011-12-31 DIAGNOSIS — R079 Chest pain, unspecified: Secondary | ICD-10-CM

## 2011-12-31 DIAGNOSIS — I798 Other disorders of arteries, arterioles and capillaries in diseases classified elsewhere: Secondary | ICD-10-CM | POA: Diagnosis present

## 2011-12-31 DIAGNOSIS — M1990 Unspecified osteoarthritis, unspecified site: Secondary | ICD-10-CM | POA: Diagnosis present

## 2011-12-31 DIAGNOSIS — Y840 Cardiac catheterization as the cause of abnormal reaction of the patient, or of later complication, without mention of misadventure at the time of the procedure: Secondary | ICD-10-CM | POA: Diagnosis not present

## 2011-12-31 DIAGNOSIS — M069 Rheumatoid arthritis, unspecified: Secondary | ICD-10-CM | POA: Diagnosis present

## 2011-12-31 DIAGNOSIS — I25810 Atherosclerosis of coronary artery bypass graft(s) without angina pectoris: Principal | ICD-10-CM | POA: Diagnosis present

## 2011-12-31 DIAGNOSIS — Z96659 Presence of unspecified artificial knee joint: Secondary | ICD-10-CM

## 2011-12-31 DIAGNOSIS — E7849 Other hyperlipidemia: Secondary | ICD-10-CM | POA: Diagnosis present

## 2011-12-31 DIAGNOSIS — E1159 Type 2 diabetes mellitus with other circulatory complications: Secondary | ICD-10-CM | POA: Diagnosis present

## 2011-12-31 DIAGNOSIS — I200 Unstable angina: Secondary | ICD-10-CM | POA: Diagnosis present

## 2011-12-31 DIAGNOSIS — IMO0002 Reserved for concepts with insufficient information to code with codable children: Secondary | ICD-10-CM | POA: Diagnosis not present

## 2011-12-31 DIAGNOSIS — Y9210 Unspecified residential institution as the place of occurrence of the external cause: Secondary | ICD-10-CM | POA: Diagnosis not present

## 2011-12-31 DIAGNOSIS — K219 Gastro-esophageal reflux disease without esophagitis: Secondary | ICD-10-CM | POA: Diagnosis present

## 2011-12-31 DIAGNOSIS — I359 Nonrheumatic aortic valve disorder, unspecified: Secondary | ICD-10-CM | POA: Diagnosis present

## 2011-12-31 DIAGNOSIS — N184 Chronic kidney disease, stage 4 (severe): Secondary | ICD-10-CM | POA: Diagnosis present

## 2011-12-31 DIAGNOSIS — N183 Chronic kidney disease, stage 3 unspecified: Secondary | ICD-10-CM | POA: Diagnosis present

## 2011-12-31 DIAGNOSIS — I10 Essential (primary) hypertension: Secondary | ICD-10-CM | POA: Diagnosis present

## 2011-12-31 DIAGNOSIS — I129 Hypertensive chronic kidney disease with stage 1 through stage 4 chronic kidney disease, or unspecified chronic kidney disease: Secondary | ICD-10-CM | POA: Diagnosis present

## 2011-12-31 DIAGNOSIS — R072 Precordial pain: Secondary | ICD-10-CM

## 2011-12-31 HISTORY — DX: Chronic diastolic (congestive) heart failure: I50.32

## 2011-12-31 HISTORY — DX: Low back pain: M54.5

## 2011-12-31 HISTORY — DX: Chronic kidney disease, stage 3 (moderate): N18.3

## 2011-12-31 HISTORY — DX: Cardiac murmur, unspecified: R01.1

## 2011-12-31 HISTORY — PX: PERCUTANEOUS CORONARY STENT INTERVENTION (PCI-S): SHX5485

## 2011-12-31 HISTORY — DX: Chronic kidney disease, stage 3 unspecified: N18.30

## 2011-12-31 HISTORY — DX: Other chronic pain: G89.29

## 2011-12-31 HISTORY — DX: Type 2 diabetes mellitus without complications: E11.9

## 2011-12-31 HISTORY — DX: Low back pain, unspecified: M54.50

## 2011-12-31 HISTORY — PX: LEFT AND RIGHT HEART CATHETERIZATION WITH CORONARY ANGIOGRAM: SHX5449

## 2011-12-31 HISTORY — PX: CARDIAC CATHETERIZATION: SHX172

## 2011-12-31 LAB — TROPONIN I: Troponin I: <0.3 ng/mL (ref <0.30)

## 2011-12-31 LAB — COMPREHENSIVE METABOLIC PANEL
ALT: 14 U/L (ref 0–53)
AST: 16 U/L (ref 0–37)
Alkaline Phosphatase: 50 U/L (ref 39–117)
CO2: 27 mEq/L (ref 19–32)
Calcium: 10.8 mg/dL — ABNORMAL HIGH (ref 8.4–10.5)
Chloride: 96 mEq/L (ref 96–112)
GFR calc Af Amer: 48 mL/min — ABNORMAL LOW (ref >90)
GFR calc non Af Amer: 41 mL/min — ABNORMAL LOW (ref >90)
Glucose, Bld: 252 mg/dL — ABNORMAL HIGH (ref 70–99)
Sodium: 135 mEq/L (ref 135–145)
Total Bilirubin: 0.5 mg/dL (ref 0.3–1.2)

## 2011-12-31 LAB — CK TOTAL AND CKMB (NOT AT ARMC)
CK, MB: 4.1 ng/mL — ABNORMAL HIGH (ref 0.3–4.0)
CK, MB: 3.4 ng/mL (ref 0.3–4.0)
Relative Index: INVALID (ref 0.0–2.5)
Relative Index: INVALID (ref 0.0–2.5)
Total CK: 73 U/L (ref 7–232)

## 2011-12-31 LAB — POCT I-STAT 3, VENOUS BLOOD GAS (G3P V)
Bicarbonate: 28.6 mEq/L — ABNORMAL HIGH (ref 20.0–24.0)
O2 Saturation: 68 %
pCO2, Ven: 47.6 mmHg (ref 45.0–50.0)
pO2, Ven: 36 mmHg (ref 30.0–45.0)

## 2011-12-31 LAB — POCT I-STAT 3, ART BLOOD GAS (G3+)
Acid-Base Excess: 2 mmol/L (ref 0.0–2.0)
pH, Arterial: 7.433 (ref 7.350–7.450)
pO2, Arterial: 67 mmHg — ABNORMAL LOW (ref 80.0–100.0)

## 2011-12-31 LAB — CBC WITH DIFFERENTIAL/PLATELET
Eosinophils Relative: 0 % (ref 0–5)
HCT: 35.8 % — ABNORMAL LOW (ref 39.0–52.0)
Lymphocytes Relative: 16 % (ref 12–46)
Lymphs Abs: 1.6 10*3/uL (ref 0.7–4.0)
MCV: 82.7 fL (ref 78.0–100.0)
Monocytes Absolute: 0.5 10*3/uL (ref 0.1–1.0)
Neutro Abs: 8.2 10*3/uL — ABNORMAL HIGH (ref 1.7–7.7)
Platelets: 202 10*3/uL (ref 150–400)
RBC: 4.33 MIL/uL (ref 4.22–5.81)
WBC: 10.3 10*3/uL (ref 4.0–10.5)

## 2011-12-31 LAB — GLUCOSE, CAPILLARY
Glucose-Capillary: 145 mg/dL — ABNORMAL HIGH (ref 70–99)
Glucose-Capillary: 157 mg/dL — ABNORMAL HIGH (ref 70–99)

## 2011-12-31 LAB — POCT ACTIVATED CLOTTING TIME: Activated Clotting Time: 134 seconds

## 2011-12-31 SURGERY — PERCUTANEOUS CORONARY STENT INTERVENTION (PCI-S)

## 2011-12-31 MED ORDER — ONDANSETRON HCL 4 MG/2ML IJ SOLN: 4.0000 mg | Freq: Four times a day (QID) | INTRAMUSCULAR | Status: DC | PRN

## 2011-12-31 MED ORDER — ASPIRIN 81 MG PO TABS: 81.0000 mg | ORAL_TABLET | Freq: Every day | ORAL | Status: DC

## 2011-12-31 MED ORDER — AMLODIPINE BESYLATE 10 MG PO TABS
10.0000 mg | ORAL_TABLET | Freq: Every day | ORAL | Status: DC
  Administered 2011-12-31 – 2012-01-04 (×5): 10 mg via ORAL
  Filled 2011-12-31 (×6): qty 1

## 2011-12-31 MED ORDER — HYDRALAZINE HCL 20 MG/ML IJ SOLN
10.0000 mg | INTRAMUSCULAR | Status: DC | PRN
  Administered 2012-01-04: 05:00:00 10 mg via INTRAVENOUS
  Filled 2011-12-31 (×2): qty 0.5

## 2011-12-31 MED ORDER — MIDAZOLAM HCL 2 MG/2ML IJ SOLN
INTRAMUSCULAR | Status: AC
  Filled 2011-12-31: qty 2

## 2011-12-31 MED ORDER — NITROGLYCERIN 0.2 MG/ML ON CALL CATH LAB
INTRAVENOUS | Status: AC
  Filled 2011-12-31: qty 1

## 2011-12-31 MED ORDER — NORTRIPTYLINE HCL 25 MG PO CAPS
25.0000 mg | ORAL_CAPSULE | Freq: Every evening | ORAL | Status: DC | PRN
  Filled 2011-12-31 (×2): qty 2

## 2011-12-31 MED ORDER — TICAGRELOR 90 MG PO TABS: 180.0000 mg | ORAL_TABLET | Freq: Once | ORAL | Status: DC

## 2011-12-31 MED ORDER — SODIUM CHLORIDE 0.9 % IJ SOLN: 3.0000 mL | INTRAMUSCULAR | Status: DC | PRN

## 2011-12-31 MED ORDER — ALPRAZOLAM 0.25 MG PO TABS
0.2500 mg | ORAL_TABLET | Freq: Two times a day (BID) | ORAL | Status: DC | PRN
  Administered 2011-12-31 – 2012-01-03 (×2): 0.25 mg via ORAL
  Filled 2011-12-31 (×2): qty 1

## 2011-12-31 MED ORDER — LOSARTAN POTASSIUM 50 MG PO TABS
100.0000 mg | ORAL_TABLET | Freq: Every day | ORAL | Status: DC
  Administered 2011-12-31 – 2012-01-04 (×5): 100 mg via ORAL
  Filled 2011-12-31 (×6): qty 2

## 2011-12-31 MED ORDER — LABETALOL HCL 5 MG/ML IV SOLN
INTRAVENOUS | Status: AC
  Filled 2011-12-31: qty 4

## 2011-12-31 MED ORDER — PREDNISONE (PAK) 5 MG PO TABS
10.0000 mg | ORAL_TABLET | Freq: Every day | ORAL | Status: DC
  Filled 2011-12-31: qty 21

## 2011-12-31 MED ORDER — NITROGLYCERIN 2 % TD OINT
0.5000 [in_us] | TOPICAL_OINTMENT | Freq: Once | TRANSDERMAL | Status: AC
  Administered 2011-12-31: 0.5 [in_us] via TOPICAL
  Filled 2011-12-31: qty 1

## 2011-12-31 MED ORDER — HEPARIN SODIUM (PORCINE) 1000 UNIT/ML IJ SOLN
INTRAMUSCULAR | Status: AC
  Filled 2011-12-31: qty 1

## 2011-12-31 MED ORDER — ASPIRIN EC 81 MG PO TBEC
81.0000 mg | DELAYED_RELEASE_TABLET | Freq: Every day | ORAL | Status: DC
  Administered 2012-01-01 – 2012-01-04 (×3): 81 mg via ORAL
  Filled 2011-12-31 (×5): qty 1

## 2011-12-31 MED ORDER — SODIUM CHLORIDE 0.9 % IJ SOLN
3.0000 mL | Freq: Two times a day (BID) | INTRAMUSCULAR | Status: DC
  Administered 2011-12-31 – 2012-01-03 (×6): 3 mL via INTRAVENOUS

## 2011-12-31 MED ORDER — HEPARIN (PORCINE) IN NACL 100-0.45 UNIT/ML-% IJ SOLN
1000.0000 [IU]/h | INTRAMUSCULAR | Status: DC
  Filled 2011-12-31: qty 250

## 2011-12-31 MED ORDER — PREDNISONE 10 MG PO TABS
10.0000 mg | ORAL_TABLET | Freq: Every day | ORAL | Status: DC
  Administered 2012-01-01 – 2012-01-04 (×4): 10 mg via ORAL
  Filled 2011-12-31 (×7): qty 1

## 2011-12-31 MED ORDER — HEPARIN (PORCINE) IN NACL 2-0.9 UNIT/ML-% IJ SOLN
INTRAMUSCULAR | Status: AC
  Filled 2011-12-31: qty 1000

## 2011-12-31 MED ORDER — SODIUM CHLORIDE 0.9 % IV SOLN: INTRAVENOUS | Status: DC

## 2011-12-31 MED ORDER — FENTANYL CITRATE 0.05 MG/ML IJ SOLN
INTRAMUSCULAR | Status: AC
  Filled 2011-12-31: qty 2

## 2011-12-31 MED ORDER — TICAGRELOR 90 MG PO TABS
ORAL_TABLET | ORAL | Status: AC
  Filled 2011-12-31: qty 2

## 2011-12-31 MED ORDER — GABAPENTIN 300 MG PO CAPS
900.0000 mg | ORAL_CAPSULE | Freq: Two times a day (BID) | ORAL | Status: DC
  Administered 2011-12-31 – 2012-01-04 (×8): 900 mg via ORAL
  Filled 2011-12-31 (×11): qty 3

## 2011-12-31 MED ORDER — NITROGLYCERIN IN D5W 200-5 MCG/ML-% IV SOLN
5.0000 ug/min | INTRAVENOUS | Status: DC
  Administered 2011-12-31: 5 ug/min via INTRAVENOUS
  Filled 2011-12-31: qty 250

## 2011-12-31 MED ORDER — PANTOPRAZOLE SODIUM 40 MG PO TBEC
40.0000 mg | DELAYED_RELEASE_TABLET | Freq: Every day | ORAL | Status: DC
  Administered 2012-01-01 – 2012-01-03 (×3): 40 mg via ORAL
  Filled 2011-12-31 (×3): qty 1

## 2011-12-31 MED ORDER — LIDOCAINE HCL (PF) 1 % IJ SOLN
INTRAMUSCULAR | Status: AC
  Filled 2011-12-31: qty 30

## 2011-12-31 MED ORDER — VITAMIN B-12 1000 MCG PO TABS
1000.0000 ug | ORAL_TABLET | Freq: Every day | ORAL | Status: DC
  Administered 2012-01-01 – 2012-01-04 (×4): 1000 ug via ORAL
  Filled 2011-12-31 (×6): qty 1

## 2011-12-31 MED ORDER — ACETAMINOPHEN 325 MG PO TABS
650.0000 mg | ORAL_TABLET | ORAL | Status: DC | PRN
  Administered 2011-12-31: 22:00:00 650 mg via ORAL
  Filled 2011-12-31: qty 2

## 2011-12-31 MED ORDER — HEPARIN BOLUS VIA INFUSION: 2500.0000 [IU] | Freq: Once | INTRAVENOUS | Status: DC

## 2011-12-31 MED ORDER — SODIUM CHLORIDE 0.9 % IV SOLN: INTRAVENOUS | Status: AC

## 2011-12-31 MED ORDER — SODIUM CHLORIDE 0.9 % IV SOLN: 250.0000 mL | INTRAVENOUS | Status: DC | PRN

## 2011-12-31 MED ORDER — HYDRALAZINE HCL 20 MG/ML IJ SOLN
INTRAMUSCULAR | Status: AC
  Filled 2011-12-31: qty 1

## 2011-12-31 MED ORDER — BIVALIRUDIN 250 MG IV SOLR
INTRAVENOUS | Status: AC
  Filled 2011-12-31: qty 250

## 2011-12-31 MED ORDER — TICAGRELOR 90 MG PO TABS
90.0000 mg | ORAL_TABLET | Freq: Two times a day (BID) | ORAL | Status: DC
  Administered 2012-01-01 – 2012-01-04 (×6): 90 mg via ORAL
  Filled 2011-12-31 (×8): qty 1

## 2011-12-31 MED ORDER — ASPIRIN 81 MG PO CHEW: 324.0000 mg | CHEWABLE_TABLET | ORAL | Status: DC

## 2011-12-31 MED ORDER — SIMVASTATIN 20 MG PO TABS
20.0000 mg | ORAL_TABLET | Freq: Every day | ORAL | Status: DC
  Administered 2012-01-01 – 2012-01-03 (×3): 20 mg via ORAL
  Filled 2011-12-31 (×6): qty 1

## 2011-12-31 MED ORDER — DIAZEPAM 5 MG PO TABS: 5.0000 mg | ORAL_TABLET | ORAL | Status: DC

## 2011-12-31 MED ORDER — ZOLPIDEM TARTRATE 5 MG PO TABS: 5.0000 mg | ORAL_TABLET | Freq: Every evening | ORAL | Status: DC | PRN

## 2011-12-31 MED ORDER — OMEPRAZOLE MAGNESIUM 20 MG PO TBEC: 20.0000 mg | DELAYED_RELEASE_TABLET | Freq: Two times a day (BID) | ORAL | Status: DC

## 2011-12-31 MED ORDER — LIDOCAINE-EPINEPHRINE 1 %-1:100000 IJ SOLN
INTRAMUSCULAR | Status: AC
  Filled 2011-12-31: qty 1

## 2011-12-31 MED ORDER — HEPARIN (PORCINE) IN NACL 100-0.45 UNIT/ML-% IJ SOLN
10.0000 [IU]/kg/h | INTRAMUSCULAR | Status: DC
  Filled 2011-12-31: qty 250

## 2011-12-31 MED ORDER — NITROGLYCERIN 0.4 MG SL SUBL: 0.4000 mg | SUBLINGUAL_TABLET | SUBLINGUAL | Status: DC | PRN

## 2011-12-31 NOTE — H&P (Signed)
Patient examined and chart reviewed.  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  Relief with nitro at home and iv nitro in ER.  Given chronic dyspnea and no chest pain favor left and right heart cath.  Patient and family in agreement.  Has SVG to D1, SVG OM, SVG PDA and LIMA to LAD  [name]

## 2011-12-31 NOTE — Progress Notes (Signed)
Patient and spouse provided with Brilinta Patient Information Booklet/Co pay card. The importance of oral antiplatelet therapy discussed during that time.

## 2011-12-31 NOTE — CV Procedure (Signed)
   Cardiac Catheterization Procedure Note  Name: [name] MRN: [id number] DOB: 1931-04-21  Procedure: Right Heart Cath, Left Heart Cath, Selective Coronary Angiography, LV angiography  Indication:    Procedural Details: The right groin was prepped, draped, and anesthetized with 1% lidocaine. Using the modified Seldinger technique a 5 French sheath was placed in the right femoral artery and a 7 French sheath was placed in the right femoral vein. A Swan-Ganz catheter was used for the right heart catheterization. Standard protocol was followed for recording of right heart pressures and sampling of oxygen saturations. Fick cardiac output was calculated. Standard Judkins catheters were used for selective coronary angiography and left ventriculography. There were no immediate procedural complications. The patient was transferred to the post catheterization recovery area for further monitoring.  Procedural Findings: Hemodynamics RA 8  RV 32 10 PA 37 19 PCWP 17 LV 180 5 AO 171 87  Oxygen saturations: PA 93 AO 68  Cardiac Output (Fick) 7.22  Cardiac Index (Fick) 3.18   Coronary angiography: Coronary dominance: right  Left mainstem: 80% distal LM  Left anterior descending (LAD): 100%  Left circumflex (LCx): 100%  Right coronary artery (RCA): 100%  SVG: large OM 30% tubular disease proximally SVG: D1 normal  SVG: RCA 99% stenosis distally with 80% ostial PDA lesion distal to graft LIMA: to LAD normal with some left to right collaterals  Left ventriculography: Left ventricular systolic function is mildly decreased with posterior basal akinesis and  no significant mitral regurgitation   Final Conclusions:  Needs PCI of SVG RCA and native PDA  Discussed with Dr Dinah Beers  Will try to do today Load with Brilinta and put on heparin in holding area     [employer] 12/31/2011, 3:53 PM

## 2011-12-31 NOTE — Progress Notes (Signed)
Mr. Zuhlke underwent right and left heart cath today per Dr. Johnsie Cancel. He was found to have severe stenosis in the distal body of the vein graft to the PDA and disease at the anastomosis of the vein graft to the PDA. I was asked to perform PCI. His PCI has been delayed over the last 2 hours secondary to the cath lab schedule. The patient has been on IV heparin in the holding area. He has had continuous brisk oozing from a skin puncture during the diagnostic cath. He was brought into the cath lab at 6:30 pm in preparation for the PCI of the vein graft. The oozing remained brisk. Lidocaine with epi was injected into the skin tract but we were unable to slow the ooze. Manual pressures was applied for 20 minutes. IV heparin was still therapeutic. The right groin was prepped and sterilly draped. I changed the 5 [place] sheath to a 6 [place] system. I then changed the 7 French venous sheath for another 7 [place] sheath. There was a hematoma present in the right groin secondary to the non sheath puncture site. Femoral artery angiogram performed in the RAO projection demonstrating that the arterial sheath entered the artery near the bifurcation. After a long discussion with the patient, I elected to delay his PCI as I felt the risk of groin complication was very high given the active bleeding from the non-sheath puncture site. We had planned on using Angiomax for the intervention. It was felt that this would significantly raise his risk of groin complication.   I discussed this with his family.  We will plan on admitting to 6500 for close observation. Sheath removal with manual pressure in cath lab when ACT less than 150. He has been loaded with Brilinta following his cath earlier today. Will continue ASA and Brilinta. Continue other cardiac meds. Troponin is negative so would not recommend heparin gtt this weekend. Plan PCI of SVG to PDA and possibly PDA at anastomosis on Monday.   Dave Mergen 7:31  PM 12/31/2011

## 2011-12-31 NOTE — ED Provider Notes (Signed)
History     CSN: [id number]  Arrival date & time 12/31/11  0216   First MD Initiated Contact with Patient 12/31/11 0248      Chief Complaint  Patient presents with  . Chest Pain    (Consider location/radiation/quality/duration/timing/severity/associated sxs/prior treatment) HPI 78 year old male presents to emergency department with complaint of chest pain starting last evening around 7 PM. Chest pain was substernal nonradiating, associated with nausea, diaphoresis, and shortness of breath. Patient has ongoing undifferentiated shortness of breath for some time, but reports it was slightly worse tonight. Chest pain occurred at rest. Patient has significant coronary disease history with CABG in 2001, reports he has not had a heart catheterization since that time. Patient is followed by Dr. admission. He saw him 2 weeks ago, and was scheduled for outpatient echo in January. Patient took nitroglycerin multiple times tonight, with improvement in symptoms each time, but had return of the chest pain. Last episode woke him from sleep. Patient has taken nitroglycerin 3 times. Patient also took TUMS and aspirin on the advice of 911. Patient is currently pain-free. Patient noted be hypertensive, which he reports is above his norm.   Past Medical History  Diagnosis Date  . Dyspnea 2009 since July -Sept    05/06/08-CPST-  normal effort, reduced VO2 max 20.5 /65%, reduced at 8.2/ 40%, normal breathing resetvca of 55%, submaximal heart rate response 112/77%, flattened o2 pluse response at peak exercise-12 ml/beat @ 85%, No VQ mismatch abnormalities, All c/w CIRC Limitation  . Hyperlipidemia   . Hypertension   . Diabetes mellitus   . GERD (gastroesophageal reflux disease)   . Arthritis     osteoarthritis, s/p R TKR, and digits  . Overweight (BMI 25.0-29.9)     BMI 29  . Interstitial lung disease     NOS  . Pulmonary infiltrates 12-09    stable, 7/10: ? due to GERD  . Diverticulosis   . Hiatal hernia    . Esophageal stricture     s/p dilation spring 2010  . CAD (coronary artery disease)     symptoms  followed lopressor Jan-Jul 09 and lisinopril increase July 09, 1/09 Myoview- non ischemic with an EF of 51% and inferobasal wall scar, 10/09 Cath- severe native three-vessel coronary artery disease, s/p multivessel coronary bypass graft with all grafts, Normal left ventricular systolic function with normal left ventricular filling pressures,   . History of PFTs     mixed pattern on spiro. mild restn on lung volumes with near normal DLCO. Pattern can be explained by CABG scar. Fev1 2.2L/73%, ratio 68 (67), TLC 4.7/68%,RV 1.5L/55%,DLCO 79%  . CAD (coronary artery disease)     09/04/08 ECHO- LVH, ef 60%, mild AS,01/2003-Adenosine cardiolite- thinning without ischemia EF 55%, 1/08 -Cardiolite -neg  . Colon polyps     Past Surgical History  Procedure Date  . Cholecystectomy 11/2005    [name]- open  . Coronary artery bypass graft 11/2000    x 5  . Cardiac catheterization 5/06    CP- no MI, Cath- small vessell disease  . Total knee arthroplasty 6/08    R knee- Riddle  . Total knee arthroplasty 12/11    Right- Dr Telford Nab    Family History  Problem Relation Age of Onset  . COPD Mother   . Heart disease Father   . Diabetes Brother   . Colon cancer Brother   . Alcohol abuse Sister     History  Substance Use Topics  . Smoking status: Former Smoker --  1.0 packs/day for 20 years    Types: Cigarettes    Quit date: 04/06/1963  . Smokeless tobacco: Never Used  . Alcohol Use: No      Review of Systems  Respiratory: Positive for chest tightness and shortness of breath.   Cardiovascular: Positive for chest pain, palpitations and leg swelling.  All other systems reviewed and are negative.    Allergies  Doxazosin mesylate and Methocarbamol  Home Medications   Current Outpatient Rx  Name Route Sig Dispense Refill  . AMLODIPINE BESYLATE 10 MG PO TABS Oral Take 1 tablet (10 mg total) by  mouth daily. 30 tablet 11  . ASPIRIN 81 MG PO TABS Oral Take 81 mg by mouth daily.      . FUROSEMIDE 20 MG PO TABS Oral Take 20 mg by mouth daily.     [name] GABAPENTIN 300 MG PO CAPS  TAKE 3 CAPSULES BY MOUTH TWICE A DAY 180 capsule 5  . LOSARTAN POTASSIUM 100 MG PO TABS Oral Take 1 tablet (100 mg total) by mouth daily. 30 tablet 11  . METFORMIN HCL 1000 MG PO TABS Oral Take 1 tablet (1,000 mg total) by mouth 2 (two) times daily with a meal. 60 tablet 11  . NITROGLYCERIN 0.4 MG SL SUBL Sublingual Place 1 tablet (0.4 mg total) under the tongue every 5 (five) minutes as needed. Chest pain 25 tablet 4  . NORTRIPTYLINE HCL 25 MG PO CAPS Oral Take 1-2 capsules (25-50 mg total) by mouth at bedtime. 60 capsule 1  . OMEPRAZOLE MAGNESIUM 20 MG PO TBEC Oral Take 20 mg by mouth daily.      [name] PRAVASTATIN SODIUM 40 MG PO TABS Oral Take 1 tablet (40 mg total) by mouth daily. 30 tablet 11  . VITAMIN B-12 1000 MCG PO TABS Oral Take 1,000 mcg by mouth daily.      BP 173/92  Pulse 94  Temp 97.8 F (36.6 C) (Oral)  Resp 24  SpO2 97%  Physical Exam  Nursing note and vitals reviewed. Constitutional: He appears well-developed and well-nourished. No distress.  HENT:  Head: Normocephalic and atraumatic.  Nose: Nose normal.  Mouth/Throat: Oropharynx is clear and moist.  Neck: Normal range of motion. Neck supple. No JVD present. No tracheal deviation present. No thyromegaly present.  Cardiovascular: Normal rate, regular rhythm, normal heart sounds and intact distal pulses.  Exam reveals no gallop and no friction rub.   No murmur heard. Pulmonary/Chest: No stridor. No respiratory distress. He has no wheezes. He has rales. He exhibits tenderness.  Abdominal: Soft. Bowel sounds are normal. He exhibits no distension and no mass. There is no tenderness. There is no rebound and no guarding.  Musculoskeletal: He exhibits edema.  Lymphadenopathy:    He has no cervical adenopathy.  Skin: Skin is warm and dry. No rash  noted. No erythema. No pallor.    ED Course  Procedures (including critical care time)  Labs Reviewed  CBC WITH DIFFERENTIAL - Abnormal; Notable for the following:    Hemoglobin 12.3 (*)     HCT 35.8 (*)     Neutrophils Relative 79 (*)     Neutro Abs 8.2 (*)     All other components within normal limits  COMPREHENSIVE METABOLIC PANEL - Abnormal; Notable for the following:    Glucose, Bld 252 (*)     Creatinine, Ser 1.55 (*)     Calcium 10.8 (*)     GFR calc non Af Amer 41 (*)  GFR calc Af Amer 48 (*)     All other components within normal limits  PRO B NATRIURETIC PEPTIDE  TROPONIN I   Dg Chest 2 View  12/31/2011  *RADIOLOGY REPORT*  Clinical Data: Chest pain.  Short of breath.  Elevated blood pressure.  History of CABG.  CHEST - 2 VIEW  Comparison: CT chest 11/02/2011.  Chest radiograph 09/09/2011.  Findings: Stable appearance of the chest.  CABG. Monitoring leads are projected over the chest.  Right basilar scarring/atelectasis inferior to the hemidiaphragm.  No airspace disease.  No effusion. Trachea midline.  IMPRESSION: No active cardiopulmonary disease or interval change.   Original Report Authenticated By: Dereck Ligas, M.D.     Date: 12/31/2011  Rate: 99  Rhythm: normal sinus rhythm  QRS Axis: left  Intervals: normal  ST/T Wave abnormalities: normal  Conduction Disutrbances:none  Narrative Interpretation:   Old EKG Reviewed: unchanged     1. Chest pain   2. Dyspnea   3. Hypertension       MDM  78 year old male with chest pain concerning for unstable angina noted be hypertensive. Will start on nitroglycerin drip. We'll get full lab work. On exam, patient has what sounds to be Rawls about one third of the way up, it is unclear if he carries a diagnosis of CHF, with reported EF of 51% although he is on Lasix and has noted some increasing swelling in his legs. Expect patient will require admission        Kalman Drape, MD 12/31/11 [phone]

## 2011-12-31 NOTE — ED Notes (Signed)
PT AND FAMILY ARE AWARE HE IS NPO AND THAT AT THIS TIME HE IS DR [facility] CASE OF THE DAY.

## 2011-12-31 NOTE — ED Notes (Signed)
Per EMS pt from home, pt c/o mid-sternum chest pain radiating through out chest wall starting approx 1900, pt thought this was heart burn took Tums and a nitro x1 at 1900, nitro @ 2300 and 0100, pt also took ASA 324 mg. Pt also reports diaphoresis, feeling hot, clammy. Pt reports pain free at the time. 20 g RAC started en route, 12 lead showed PVC's which is pt's norm.

## 2011-12-31 NOTE — Interval H&P Note (Signed)
History and Physical Interval Note:  12/31/2011 2:46 PM  [name]  has presented today for surgery, with the diagnosis of Chest pain  The various methods of treatment have been discussed with the patient and family. After consideration of risks, benefits and other options for treatment, the patient has consented to  Procedure(s) (LRB) with comments: LEFT HEART CATHETERIZATION WITH CORONARY ANGIOGRAM (N/A) as a surgical intervention .  The patient's history has been reviewed, patient examined, no change in status, stable for surgery.  I have reviewed the patient's chart and labs.  Questions were answered to the patient's satisfaction.     [name]

## 2011-12-31 NOTE — ED Notes (Signed)
Pt reports anterior chest wall pain, nausea, weakness, and diaphoretic starting 1900, pt also reports SOB but sts that is normal for him. Pt reports taking nitro x1 at 1900, 1930, and 0100, ASA 324 mg at 0100. Pt denies V/D, fever, cough, back/abd pain. Pt reports he took Tums sts he felt like he was having "indigestion."

## 2011-12-31 NOTE — H&P (Signed)
CARDIOLOGY HISTORY AND PHYSICAL   Patient ID: [name] MRN: [id number] DOB/AGE: 11-Jun-1931 78 y.o.  Admit date: 12/31/2011  Primary Physician   Viviana Simpler, MD Primary Cardiologist   PN Reason for Consultation   Chest pain  [id number] C [name] is a 78 y.o. male with a history of CAD. [name] is a 78 year old male with known coronary artery disease. He was last seen by Dr. Johnsie Cancel on September 9. He has chronic dyspnea on exertion but has never had chest pain. He had lab work indicating rheumatoid arthritis, and was seen by a rheumatologist. He was placed on steroids. He started the steroids yesterday, taking 4 tablets (unclear strength).  Last p.m., he was sitting quietly and had onset of substernal chest pain. It did not radiate. It was associated with shortness of breath and some diaphoresis. He took a nitroglycerin and it resolved. His pain returned again and was relieved with sublingual nitroglycerin x1. He then went to bed. The pain woke him about 3 AM and was a 10/10. He again took a sublingual nitroglycerin which relieved the pain. He has never had pain like this before. He does not exert himself very much because of his respiratory issues, but is able to walk 30-40 feet at a time without stopping. He has never had chest pain with exertion or with ambulation. His previous anginal symptom was dyspnea on exertion. His dyspnea on exertion currently, is at its baseline. He denies lower extremity edema, or PND but has chronic orthopnea. He was started on nitroglycerin paste in the emergency room and his chest pain has not returned.   Past Medical History  Diagnosis Date  . Dyspnea 2009 since July -Sept    05/06/08-CPST-  normal effort, reduced VO2 max 20.5 /65%, reduced at 8.2/ 40%, normal breathing resetvca of 55%, submaximal heart rate response 112/77%, flattened o2 pluse response at peak exercise-12 ml/beat @ 85%, No VQ mismatch abnormalities, All c/w CIRC Limitation  .  Hyperlipidemia   . Hypertension   . Diabetes mellitus   . GERD (gastroesophageal reflux disease)   . Arthritis     osteoarthritis, s/p R TKR, and digits  . Overweight (BMI 25.0-29.9)     BMI 29  . Interstitial lung disease     NOS  . Pulmonary infiltrates 12-09    stable, 7/10: ? due to GERD  . Diverticulosis   . Hiatal hernia   . Esophageal stricture     s/p dilation spring 2010  . CAD (coronary artery disease)     symptoms  followed lopressor Jan-Jul 09 and lisinopril increase July 09, 1/09 Myoview- non ischemic with an EF of 51% and inferobasal wall scar, 10/09 Cath- severe native three-vessel coronary artery disease, s/p multivessel coronary bypass graft with all grafts, Normal left ventricular systolic function with normal left ventricular filling pressures,   . History of PFTs     mixed pattern on spiro. mild restn on lung volumes with near normal DLCO. Pattern can be explained by CABG scar. Fev1 2.2L/73%, ratio 68 (67), TLC 4.7/68%,RV 1.5L/55%,DLCO 79%  . CAD (coronary artery disease)     09/04/08 ECHO- LVH, ef 60%, mild AS,01/2003-Adenosine cardiolite- thinning without ischemia EF 55%, 1/08 -Cardiolite -neg  . Colon polyps      Past Surgical History  Procedure Date  . Cholecystectomy 11/2005    [name]- open  . Coronary artery bypass graft 11/2000    x 5  . Cardiac catheterization 5/06    CP- no MI, Cath-  small vessell disease  . Total knee arthroplasty 6/08    R knee- Riddle  . Total knee arthroplasty 12/11    Right- Dr Telford Nab    Allergies  Allergen Reactions  . Doxazosin Mesylate     REACTION: dizzy  . Methocarbamol     REACTION: RASH    I have reviewed the patient's current medications   . nitroGLYCERIN  0.5 inch Topical Once    Medication Sig  Prednisone 5 mg tablet  Take 4 tablets daily for 2 weeks, then as directed.  amLODipine (NORVASC) 10 MG tablet Take 1 tablet (10 mg total) by mouth daily.  aspirin 81 MG tablet Take 81 mg by mouth daily.      furosemide (LASIX) 20 MG tablet Take 20 mg by mouth daily.   gabapentin (NEURONTIN) 300 MG capsule TAKE 3 CAPSULES BY MOUTH TWICE A DAY  losartan (COZAAR) 100 MG tablet Take 1 tablet  by mouth daily.  metFORMIN (GLUCOPHAGE) 1000 MG tablet Take 1 tablet (1,000 mg total) by mouth 2 times daily with food.  nitroGLYCERIN (NITROSTAT) 0.4 MG SL tablet Place 1 tablet under the tongue every 5 minutes as needed.      nortriptyline (PAMELOR) 25 MG capsule Take 1-2 capsules (25-50 mg total) by mouth at bedtime.  omeprazole (PRILOSEC OTC) 20 MG tablet Take 20 mg by mouth daily.    pravastatin (PRAVACHOL) 40 MG tablet Take 1 tablet (40 mg total) by mouth daily.  vitamin B-12 (CYANOCOBALAMIN) 1000 MCG tablet Take 1,000 mcg by mouth daily.     History   Social History  . Marital Status: Married    Spouse Name: N/A    Number of Children: N/A  . Years of Education: N/A   Occupational History  . lawn mower     retired   Social History Main Topics  . Smoking status: Former Smoker -- 1.0 packs/day for 20 years    Types: Cigarettes    Quit date: 04/06/1963  . Smokeless tobacco: Never Used  . Alcohol Use: No  . Drug Use: Not on file  . Sexually Active: Not on file   Other Topics Concern  . Not on file   Social History Narrative   Has children    Family Status  Relation Status Death Age  . Mother Deceased 65    died of copd  . Father Deceased 93    died of heart disease  . Brother Deceased     died if DM coplications  . Brother Deceased     died of colon ca  . Sister Deceased     died of alcoholism  . Sister Alive   . Sister Alive   . Sister Alive   . Sister Alive   . Brother Alive     Family History  Problem Relation Age of Onset  . COPD Mother   . Heart disease Father   . Diabetes Brother   . Colon cancer Brother   . Alcohol abuse Sister      ROS: He has had no recent illnesses, fevers or chills. His respiratory status is at baseline. He denies melena. He has not been  having problems with reflux. He denies lower extremity edema and is not aware of any weight changes. Full 14 point review of systems complete and found to be negative unless listed above.  Physical Exam: Blood pressure 140/85, pulse 92, temperature 97.8 F (36.6 C), temperature source Oral, resp. rate 23, SpO2 97.00%.  General: Well developed, well  nourished, male in no acute distress Head: Eyes PERRLA, No xanthomas.   Normocephalic and atraumatic, oropharynx without edema or exudate. Dentition: poor Lungs: bilateral basilar rales worse on the right Heart: HRRR S1 S2, no rub/gallop, 2/6 systolic murmur. pulses are 2+ all 4 extrem.   Neck: No carotid bruits. No lymphadenopathy.  JVD not elevated. Abdomen: Bowel sounds present, abdomen soft and non-tender without masses or hernias noted. Msk:  No spine or cva tenderness. No weakness, no joint deformities or effusions. Extremities: No clubbing or cyanosis. No edema.  Neuro: Alert and oriented X 3. No focal deficits noted. Psych:  Good affect, responds appropriately Skin: No rashes or lesions noted.  Labs:   Lab Results  Component Value Date   WBC 10.3 12/31/2011   HGB 12.3* 12/31/2011   HCT 35.8* 12/31/2011   MCV 82.7 12/31/2011   PLT 202 12/31/2011     Lab 12/31/11 0249  NA 135  K 3.9  CL 96  CO2 27  BUN 22  CREATININE 1.55*  CALCIUM 10.8*  PROT 7.8  BILITOT 0.5  ALKPHOS 50  ALT 14  AST 16  GLUCOSE 252*    Basename 12/31/11 0259  CKTOTAL --  CKMB --  TROPONINI <0.30   Pro B Natriuretic peptide (BNP)  Date/Time Value Range Status  12/31/2011  2:49 AM 400.4  0 - 450 pg/mL Final  11/09/2011  9:28 AM 25.0  0.0 - 100.0 pg/mL Final    Echo: 05/04/2011 Study Conclusions - Left ventricle: The cavity size was normal. Wall thickness was increased in a pattern of mild LVH. Systolic function was normal. The estimated ejection fraction was in the range of 55% to 60%. Wall motion was normal; there were no regional wall motion  abnormalities. Doppler parameters are consistent with abnormal left ventricular relaxation (grade 1 diastolic dysfunction). - Aortic valve: There was mild stenosis. Trivial regurgitation. - Mitral valve: Mild regurgitation. - Pulmonary arteries: Systolic pressure was mildly increased. PA peak pressure: 31mm Hg (S).  ECG:  31-Dec-2011 02:20:38  SINUS RHYTHM ~ normal P axis, V-rate 50- 99 LEFT AXIS DEVIATION ~ QRS axis (-30,-90) Standard 12 Lead Report ~ Unconfirmed Interpretation Otherwise Normal ECG Vent. rate 99 BPM PR interval 180 ms QRS duration 104 ms QT/QTc 360/462 ms P-R-T axes 70 -40 17  Myoview: 05/06/2011 Impression  Exercise Capacity: Lexiscan with no exercise.  BP Response: Normal blood pressure response.  Clinical Symptoms: No chest pain.  ECG Impression: No significant ST segment change suggestive of ischemia.  Comparison with Prior Nuclear Study: No change compared to 2009  Overall Impression: Low risk stress nuclear study. Small inferobasal wall scar with no ischemia   Radiology:  Dg Chest 2 View 12/31/2011  *RADIOLOGY REPORT*  Clinical Data: Chest pain.  Short of breath.  Elevated blood pressure.  History of CABG.  CHEST - 2 VIEW  Comparison: CT chest 11/02/2011.  Chest radiograph 09/09/2011.  Findings: Stable appearance of the chest.  CABG. Monitoring leads are projected over the chest.  Right basilar scarring/atelectasis inferior to the hemidiaphragm.  No airspace disease.  No effusion. Trachea midline.  IMPRESSION: No active cardiopulmonary disease or interval change.   Original Report Authenticated By: Dereck Ligas, M.D.     ASSESSMENT AND PLAN:   The patient was seen today by Dr Johnsie Cancel, the patient evaluated and the data reviewed.  Principal Problem:  *Chest pain - admit, rule out, continue current therapies, M.D. advised on further evaluation with catheterization. Last cath was 2009 with patent grafts, last  Myoview January 2013. Since symptoms responded to  nitroglycerin, may need cath. The risks and benefits of a cardiac catheterization including, but not limited to, death, stroke, MI, kidney damage and bleeding were discussed with the patient who indicates understanding and agrees to proceed.    Active Problems:  HYPERLIPIDEMIA  OBSTRUCTIVE SLEEP APNEA  HYPERTENSION  AORTIC STENOSIS  Reflux esophagitis  GERD  DIABETES MELLITUS, WITH VASCULAR COMPLICATIONS  Chronic kidney disease, stage III (moderate)   Signed: Rosaria Ferries 12/31/2011, 9:54 AM Co-Sign MD

## 2011-12-31 NOTE — Interval H&P Note (Signed)
History and Physical Interval Note:  12/31/2011 6:56 PM  [name]  has presented today for PCI of SVG to PDA  with the diagnosis of Chest pain  The various methods of treatment have been discussed with the patient and family. After consideration of risks, benefits and other options for treatment, the patient has consented to  Procedure(s) (LRB) with comments: LEFT AND RIGHT HEART CATHETERIZATION WITH CORONARY ANGIOGRAM () PERCUTANEOUS CORONARY STENT INTERVENTION (PCI-S) () as a surgical intervention .  The patient's history has been reviewed, patient examined, no change in status, stable for surgery.  I have reviewed the patient's chart and labs.  Questions were answered to the patient's satisfaction.     Norman Bier

## 2012-01-01 ENCOUNTER — Encounter (HOSPITAL_COMMUNITY): Payer: Self-pay | Admitting: General Practice

## 2012-01-01 DIAGNOSIS — R079 Chest pain, unspecified: Secondary | ICD-10-CM

## 2012-01-01 LAB — BASIC METABOLIC PANEL
CO2: 26 mEq/L (ref 19–32)
Glucose, Bld: 179 mg/dL — ABNORMAL HIGH (ref 70–99)
Potassium: 3.3 mEq/L — ABNORMAL LOW (ref 3.5–5.1)
Sodium: 140 mEq/L (ref 135–145)

## 2012-01-01 LAB — LIPID PANEL: LDL Cholesterol: 69 mg/dL (ref 0–99)

## 2012-01-01 LAB — GLUCOSE, CAPILLARY
Glucose-Capillary: 173 mg/dL — ABNORMAL HIGH (ref 70–99)
Glucose-Capillary: 303 mg/dL — ABNORMAL HIGH (ref 70–99)

## 2012-01-01 LAB — CBC
Hemoglobin: 11.8 g/dL — ABNORMAL LOW (ref 13.0–17.0)
MCH: 28.2 pg (ref 26.0–34.0)
RBC: 4.18 MIL/uL — ABNORMAL LOW (ref 4.22–5.81)

## 2012-01-01 MED ORDER — INSULIN ASPART 100 UNIT/ML ~~LOC~~ SOLN: 0.0000 [IU] | Freq: Three times a day (TID) | SUBCUTANEOUS | Status: DC

## 2012-01-01 MED ORDER — INSULIN ASPART 100 UNIT/ML ~~LOC~~ SOLN
0.0000 [IU] | Freq: Three times a day (TID) | SUBCUTANEOUS | Status: DC
  Administered 2012-01-02: 3 [IU] via SUBCUTANEOUS
  Administered 2012-01-02 (×2): 5 [IU] via SUBCUTANEOUS
  Administered 2012-01-03 – 2012-01-04 (×2): 3 [IU] via SUBCUTANEOUS

## 2012-01-01 MED ORDER — INSULIN ASPART 100 UNIT/ML ~~LOC~~ SOLN
0.0000 [IU] | Freq: Every day | SUBCUTANEOUS | Status: DC
  Administered 2012-01-01 – 2012-01-02 (×2): 3 [IU] via SUBCUTANEOUS

## 2012-01-01 NOTE — Progress Notes (Signed)
Patient ID: [name], male   DOB: January 09, 1932, 78 y.o.   MRN: [id number]    Subjective:  Denies SSCP, palpitations or Dyspnea Groin feels fine  Objective:  Filed Vitals:   12/31/11 2347 01/01/12 0400 01/01/12 0500 01/01/12 0600  BP: 163/109 128/62  146/72  Pulse:  76  76  Temp: 97.4 F (36.3 C)   97.4 F (36.3 C)  TempSrc:      Resp:  20  19  Height:    6\' 1"  (1.854 m)  Weight:   219 lb 14.4 oz (99.746 kg) 219 lb 14.4 oz (99.746 kg)  SpO2:  97%  95%    Intake/Output from previous day:  Intake/Output Summary (Last 24 hours) at 01/01/12 0751 Last data filed at 01/01/12 0600  Gross per 24 hour  Intake    240 ml  Output    600 ml  Net   -360 ml    Physical Exam: Affect appropriate Healthy:  appears stated age HEENT: normal Neck supple with no adenopathy JVP normal no bruits no thyromegaly Lungs clear with no wheezing and good diaphragmatic motion Heart:  S1/S2 no murmur, no rub, gallop or click PMI normal Abdomen: benighn, BS positve, no tenderness, no AAA no bruit.  No HSM or HJR Distal pulses intact with no bruits No edema Neuro non-focal Skin warm and dry No muscular weakness   Lab Results: Basic Metabolic Panel:  Basename 12/31/11 0249  NA 135  K 3.9  CL 96  CO2 27  GLUCOSE 252*  BUN 22  CREATININE 1.55*  CALCIUM 10.8*  MG --  PHOS --   Liver Function Tests:  Basename 12/31/11 0249  AST 16  ALT 14  ALKPHOS 50  BILITOT 0.5  PROT 7.8  ALBUMIN 3.7   No results found for this basename: LIPASE:2,AMYLASE:2 in the last 72 hours CBC:  Basename 01/01/12 0715 12/31/11 0249  WBC 8.0 10.3  NEUTROABS -- 8.2*  HGB 11.8* 12.3*  HCT 35.0* 35.8*  MCV 83.7 82.7  PLT 181 202   Cardiac Enzymes:  Basename 12/31/11 2117 12/31/11 1007 12/31/11 0259  CKTOTAL 73 86 --  CKMB 3.4 4.1* --  CKMBINDEX -- -- --  TROPONINI <0.30 <0.30 <0.30    Imaging: Dg Chest 2 View  12/31/2011  *RADIOLOGY REPORT*  Clinical Data: Chest pain.  Short of breath.   Elevated blood pressure.  History of CABG.  CHEST - 2 VIEW  Comparison: CT chest 11/02/2011.  Chest radiograph 09/09/2011.  Findings: Stable appearance of the chest.  CABG. Monitoring leads are projected over the chest.  Right basilar scarring/atelectasis inferior to the hemidiaphragm.  No airspace disease.  No effusion. Trachea midline.  IMPRESSION: No active cardiopulmonary disease or interval change.   Original Report Authenticated By: Dereck Ligas, M.D.     Cardiac Studies:  ECG: NSR no ischemia 9/27   Telemetry: NSR no VT 01/01/2012   Echo:   Medications:     . amLODipine  10 mg Oral Daily  . aspirin EC  81 mg Oral Daily  . bivalirudin      . fentaNYL      . gabapentin  900 mg Oral BID  . heparin      . heparin      . hydrALAZINE      . labetalol      . lidocaine      . lidocaine-EPINEPHrine      . losartan  100 mg Oral Daily  . midazolam      .  midazolam      . nitroGLYCERIN      . pantoprazole  40 mg Oral Q1200  . predniSONE  10 mg Oral Q breakfast  . simvastatin  20 mg Oral q1800  . sodium chloride  3 mL Intravenous Q12H  . Ticagrelor      . Ticagrelor  180 mg Oral Once  . Ticagrelor  90 mg Oral BID  . vitamin B-12  1,000 mcg Oral Daily  . DISCONTD: aspirin  324 mg Oral Pre-Cath  . DISCONTD: aspirin  81 mg Oral Daily  . DISCONTD: diazepam  5 mg Oral On Call  . DISCONTD: heparin  2,500 Units Intravenous Once  . DISCONTD: omeprazole  20 mg Oral BID AC  . DISCONTD: predniSONE  10 mg Oral Daily       . sodium chloride 50 mL/hr at 12/31/11 2300  . DISCONTD: sodium chloride    . DISCONTD: heparin    . DISCONTD: heparin      Assessment/Plan:  CAD:  Unstable angina 95% SVG RCA lesion For PCI Monday  Continue heparin and brilinta.  Groin looks Fine this am.   Dyspnea:  Chronic EF ok and right heart pressures ok at cath.   HTN:  Continue current meds.  Got labatolol in lab  [employer] 01/01/2012, 7:51 AM

## 2012-01-01 NOTE — Progress Notes (Signed)
  Echocardiogram 2D Echocardiogram has been performed.  Mauricio Po 01/01/2012, 8:39 AM

## 2012-01-02 LAB — GLUCOSE, CAPILLARY
Glucose-Capillary: 166 mg/dL — ABNORMAL HIGH (ref 70–99)
Glucose-Capillary: 201 mg/dL — ABNORMAL HIGH (ref 70–99)
Glucose-Capillary: 260 mg/dL — ABNORMAL HIGH (ref 70–99)

## 2012-01-02 MED ORDER — SODIUM CHLORIDE 0.9 % IV SOLN: 250.0000 mL | INTRAVENOUS | Status: DC | PRN

## 2012-01-02 MED ORDER — SODIUM CHLORIDE 0.9 % IJ SOLN: 3.0000 mL | INTRAMUSCULAR | Status: DC | PRN

## 2012-01-02 MED ORDER — ASPIRIN 81 MG PO CHEW
324.0000 mg | CHEWABLE_TABLET | ORAL | Status: AC
  Administered 2012-01-03: 324 mg via ORAL
  Filled 2012-01-02: qty 4

## 2012-01-02 MED ORDER — SODIUM CHLORIDE 0.9 % IV SOLN
INTRAVENOUS | Status: DC
  Administered 2012-01-03: 07:00:00 via INTRAVENOUS

## 2012-01-02 MED ORDER — HEPARIN (PORCINE) IN NACL 100-0.45 UNIT/ML-% IJ SOLN
1200.0000 [IU]/h | INTRAMUSCULAR | Status: DC
  Filled 2012-01-02: qty 250

## 2012-01-02 MED ORDER — SODIUM CHLORIDE 0.9 % IJ SOLN
3.0000 mL | Freq: Two times a day (BID) | INTRAMUSCULAR | Status: DC
  Administered 2012-01-02: 3 mL via INTRAVENOUS

## 2012-01-02 NOTE — Progress Notes (Signed)
Patient ID: [name], male   DOB: 07/08/1931, 78 y.o.   MRN: [id number]    Subjective:  Denies SSCP, palpitations or Dyspnea Groin feels fine  Objective:  Filed Vitals:   01/01/12 0954 01/01/12 1511 01/01/12 2100 01/02/12 0500  BP: 166/85 165/97 160/88 156/97  Pulse: 87 88 90 77  Temp: 97.7 F (36.5 C) 97.9 F (36.6 C) 98.5 F (36.9 C) 97.6 F (36.4 C)  TempSrc: Oral Oral    Resp: 18 18 21 18   Height:      Weight: 220 lb 4.8 oz (99.927 kg)   209 lb 7 oz (95 kg)  SpO2: 94% 97% 95% 95%    Intake/Output from previous day:  Intake/Output Summary (Last 24 hours) at 01/02/12 0919 Last data filed at 01/01/12 2100  Gross per 24 hour  Intake    480 ml  Output    200 ml  Net    280 ml    Physical Exam: Affect appropriate Healthy:  appears stated age HEENT: normal Neck supple with no adenopathy JVP normal no bruits no thyromegaly Lungs clear with no wheezing and good diaphragmatic motion Heart:  S1/S2 no murmur, no rub, gallop or click PMI normal Abdomen: benighn, BS positve, no tenderness, no AAA no bruit.  No HSM or HJR Distal pulses intact with no bruits No edema Neuro non-focal Skin warm and dry No muscular weakness   Lab Results: Basic Metabolic Panel:  Basename 01/01/12 0715 12/31/11 0249  NA 140 135  K 3.3* 3.9  CL 103 96  CO2 26 27  GLUCOSE 179* 252*  BUN 18 22  CREATININE 1.32 1.55*  CALCIUM 9.7 10.8*  MG -- --  PHOS -- --   Liver Function Tests:  [facility] 12/31/11 0249  AST 16  ALT 14  ALKPHOS 50  BILITOT 0.5  PROT 7.8  ALBUMIN 3.7   No results found for this basename: LIPASE:2,AMYLASE:2 in the last 72 hours CBC:  Basename 01/01/12 0715 12/31/11 0249  WBC 8.0 10.3  NEUTROABS -- 8.2*  HGB 11.8* 12.3*  HCT 35.0* 35.8*  MCV 83.7 82.7  PLT 181 202   Cardiac Enzymes:  Basename 12/31/11 2117 12/31/11 1007 12/31/11 0259  CKTOTAL 73 86 --  CKMB 3.4 4.1* --  CKMBINDEX -- -- --  TROPONINI <0.30 <0.30 <0.30    Imaging: No  results found.  Cardiac Studies:  ECG: NSR no ischemia 9/27   Telemetry: NSR no VT 01/02/2012   Echo:   Medications:      . amLODipine  10 mg Oral Daily  . aspirin EC  81 mg Oral Daily  . gabapentin  900 mg Oral BID  . insulin aspart  0-15 Units Subcutaneous TID WC  . insulin aspart  0-5 Units Subcutaneous QHS  . losartan  100 mg Oral Daily  . pantoprazole  40 mg Oral Q1200  . predniSONE  10 mg Oral Q breakfast  . simvastatin  20 mg Oral q1800  . sodium chloride  3 mL Intravenous Q12H  . Ticagrelor  180 mg Oral Once  . Ticagrelor  90 mg Oral BID  . vitamin B-12  1,000 mcg Oral Daily  . DISCONTD: insulin aspart  0-15 Units Subcutaneous TID WC  . DISCONTD: insulin aspart  0-15 Units Subcutaneous TID WC       Assessment/Plan:  CAD:  Unstable angina 95% SVG RCA lesion For PCI Monday   Brilinta.  Groin looks Fine this am.  Heparin if more pain Dyspnea:  Chronic  EF ok and right heart pressures ok at cath.  Echo reviewed EF 50-55% minimal AS HTN:  Continue current meds.  Got labatolol in lab Rheumatoid:  ? Lung involvement on steroids per pulmonary  [name] 01/02/2012, 9:19 AM

## 2012-01-02 NOTE — Progress Notes (Addendum)
ANTICOAGULATION CONSULT NOTE - Initial Consult  Pharmacy Consult for Heparin Indication: chest pain/ACS  Allergies  Allergen Reactions  . Doxazosin Mesylate Other (See Comments)    REACTION: dizzy  . Methocarbamol Rash    "don't remember how bad"    Patient Measurements: Height: 6\' 1"  (185.4 cm) Weight: 209 lb 7 oz (95 kg) IBW/kg (Calculated) : 79.9  Heparin Dosing Weight: 94.4 kg  Vital Signs: Temp: 97.6 F (36.4 C) (09/29 0500) BP: 156/97 mmHg (09/29 0500) Pulse Rate: 77  (09/29 0500)  Labs:  Basename 01/01/12 0715 12/31/11 2117 12/31/11 1157 12/31/11 1007 12/31/11 0259 12/31/11 0249  HGB 11.8* -- -- -- -- 12.3*  HCT 35.0* -- -- -- -- 35.8*  PLT 181 -- -- -- -- 202  APTT -- -- 27 -- -- --  LABPROT -- -- 13.2 -- -- --  INR -- -- 1.01 -- -- --  HEPARINUNFRC -- -- -- -- -- --  CREATININE 1.32 -- -- -- -- 1.55*  CKTOTAL -- 73 -- 86 -- --  CKMB -- 3.4 -- 4.1* -- --  TROPONINI -- <0.30 -- <0.30 <0.30 --    Estimated Creatinine Clearance: 52.1 ml/min (by C-G formula based on Cr of 1.32).   Medical History: Past Medical History  Diagnosis Date  . Hyperlipidemia   . Hypertension   . GERD (gastroesophageal reflux disease)   . Arthritis     osteoarthritis, s/p R TKR, and digits  . Overweight (BMI 25.0-29.9)     BMI 29  . Interstitial lung disease     NOS  . Pulmonary infiltrates 12-09    stable, 7/10: ? due to GERD  . Diverticulosis   . Hiatal hernia   . Esophageal stricture     s/p dilation spring 2010  . CAD (coronary artery disease)     symptoms  followed lopressor Jan-Jul 09 and lisinopril increase July 09, 1/09 Myoview- non ischemic with an EF of 51% and inferobasal wall scar, 10/09 Cath- severe native three-vessel coronary artery disease, s/p multivessel coronary bypass graft with all grafts, Normal left ventricular systolic function with normal left ventricular filling pressures,   . History of PFTs     mixed pattern on spiro. mild restn on lung volumes  with near normal DLCO. Pattern can be explained by CABG scar. Fev1 2.2L/73%, ratio 68 (67), TLC 4.7/68%,RV 1.5L/55%,DLCO 79%  . CAD (coronary artery disease)     09/04/08 ECHO- LVH, ef 60%, mild AS,01/2003-Adenosine cardiolite- thinning without ischemia EF 55%, 1/08 -Cardiolite -neg  . Colon polyps   . Chronic kidney disease, stage III (moderate)   . CHF (congestive heart failure)   . Heart murmur   . Anginal pain   . Dyspnea 2009 since July -Sept    05/06/08-CPST-  normal effort, reduced VO2 max 20.5 /65%, reduced at 8.2/ 40%, normal breathing resetvca of 55%, submaximal heart rate response 112/77%, flattened o2 pluse response at peak exercise-12 ml/beat @ 85%, No VQ mismatch abnormalities, All c/w CIRC Limitation  . Type II diabetes mellitus   . Chronic lower back pain     Medications:  Scheduled:    . amLODipine  10 mg Oral Daily  . aspirin EC  81 mg Oral Daily  . gabapentin  900 mg Oral BID  . insulin aspart  0-15 Units Subcutaneous TID WC  . insulin aspart  0-5 Units Subcutaneous QHS  . losartan  100 mg Oral Daily  . pantoprazole  40 mg Oral Q1200  . predniSONE  10  mg Oral Q breakfast  . simvastatin  20 mg Oral q1800  . sodium chloride  3 mL Intravenous Q12H  . Ticagrelor  180 mg Oral Once  . Ticagrelor  90 mg Oral BID  . vitamin B-12  1,000 mcg Oral Daily  . DISCONTD: insulin aspart  0-15 Units Subcutaneous TID WC  . DISCONTD: insulin aspart  0-15 Units Subcutaneous TID WC    Assessment: 78 year old male with chest pain scheduled to receive PCI tomorrow. Procedure was initially planned for 9/27 but delayed due to patient having a groin hematoma and brisk oozing blood from skin puncture. Heparin held and pharmacy asked to restart today if groin looks good. Per Dr. Johnsie Cancel, groin looks fine this am. H/H slightly low at 11.8/35. Platelets wnl at 181. Will not give heparin bolus due to patient's recent bleeding episode. Will initiate heparin infusion.  Goal of Therapy:  Heparin  level 0.3-0.7 units/ml Monitor platelets by anticoagulation protocol: Yes   Plan:  Start heparin infusion at 1200 units/hr Obtain heparin level in 8 hours Daily CBC  ________________________________________________ Addendum Heparin NOT to be starter per Dr. Johnsie Cancel- only wants heparin if patient has more pain (which he has not). Heparin order d/c'd.  Dicky Doe, PharmD Clinical Pharmacist Pager: [phone] Phone: [phone] 01/02/2012 3:05 PM

## 2012-01-03 ENCOUNTER — Encounter (HOSPITAL_COMMUNITY)
Admission: EM | Disposition: A | Discharge status: Home / Self Care | Payer: Self-pay | Source: Home / Self Care | Attending: Cardiovascular Disease

## 2012-01-03 ENCOUNTER — Encounter (HOSPITAL_COMMUNITY): Payer: Self-pay

## 2012-01-03 DIAGNOSIS — I25810 Atherosclerosis of coronary artery bypass graft(s) without angina pectoris: Secondary | ICD-10-CM

## 2012-01-03 HISTORY — PX: PERCUTANEOUS CORONARY STENT INTERVENTION (PCI-S): SHX5485

## 2012-01-03 HISTORY — PX: CORONARY ANGIOPLASTY WITH STENT PLACEMENT: SHX49

## 2012-01-03 HISTORY — PX: PERCUTANEOUS CORONARY INTERVENTION-BALLOON ONLY: SHX6014

## 2012-01-03 LAB — CBC
Hemoglobin: 11.9 g/dL — ABNORMAL LOW (ref 13.0–17.0)
MCV: 82.9 fL (ref 78.0–100.0)
Platelets: 200 10*3/uL (ref 150–400)
RBC: 4.22 MIL/uL (ref 4.22–5.81)
WBC: 11.2 10*3/uL — ABNORMAL HIGH (ref 4.0–10.5)

## 2012-01-03 LAB — BASIC METABOLIC PANEL
Chloride: 103 mEq/L (ref 96–112)
Creatinine, Ser: 1.42 mg/dL — ABNORMAL HIGH (ref 0.50–1.35)
GFR calc Af Amer: 53 mL/min — ABNORMAL LOW (ref >90)
Potassium: 3.2 mEq/L — ABNORMAL LOW (ref 3.5–5.1)

## 2012-01-03 LAB — GLUCOSE, CAPILLARY
Glucose-Capillary: 179 mg/dL — ABNORMAL HIGH (ref 70–99)
Glucose-Capillary: 179 mg/dL — ABNORMAL HIGH (ref 70–99)
Glucose-Capillary: 196 mg/dL — ABNORMAL HIGH (ref 70–99)

## 2012-01-03 SURGERY — PERCUTANEOUS CORONARY STENT INTERVENTION (PCI-S)
Anesthesia: LOCAL

## 2012-01-03 MED ORDER — MIDAZOLAM HCL 2 MG/2ML IJ SOLN
INTRAMUSCULAR | Status: AC
  Filled 2012-01-03: qty 2

## 2012-01-03 MED ORDER — SODIUM CHLORIDE 0.9 % IV SOLN
1.0000 mL/kg/h | INTRAVENOUS | Status: AC
  Administered 2012-01-03: 13:00:00 1 mL/kg/h via INTRAVENOUS

## 2012-01-03 MED ORDER — HEPARIN SODIUM (PORCINE) 1000 UNIT/ML IJ SOLN
INTRAMUSCULAR | Status: AC
  Filled 2012-01-03: qty 1

## 2012-01-03 MED ORDER — LIDOCAINE HCL (PF) 1 % IJ SOLN
INTRAMUSCULAR | Status: AC
  Filled 2012-01-03: qty 30

## 2012-01-03 MED ORDER — NITROGLYCERIN 0.2 MG/ML ON CALL CATH LAB
INTRAVENOUS | Status: AC
  Filled 2012-01-03: qty 1

## 2012-01-03 MED ORDER — FENTANYL CITRATE 0.05 MG/ML IJ SOLN
INTRAMUSCULAR | Status: AC
  Filled 2012-01-03: qty 2

## 2012-01-03 MED ORDER — BIVALIRUDIN 250 MG IV SOLR
INTRAVENOUS | Status: AC
  Filled 2012-01-03: qty 250

## 2012-01-03 MED ORDER — VERAPAMIL HCL 2.5 MG/ML IV SOLN
INTRAVENOUS | Status: AC
  Filled 2012-01-03: qty 2

## 2012-01-03 MED ORDER — CLOPIDOGREL BISULFATE 300 MG PO TABS
ORAL_TABLET | ORAL | Status: AC
  Filled 2012-01-03: qty 2

## 2012-01-03 MED ORDER — POTASSIUM CHLORIDE CRYS ER 20 MEQ PO TBCR
40.0000 meq | EXTENDED_RELEASE_TABLET | Freq: Once | ORAL | Status: AC
  Administered 2012-01-03: 40 meq via ORAL
  Filled 2012-01-03: qty 2

## 2012-01-03 MED ORDER — HEPARIN (PORCINE) IN NACL 2-0.9 UNIT/ML-% IJ SOLN
INTRAMUSCULAR | Status: AC
  Filled 2012-01-03: qty 1000

## 2012-01-03 MED FILL — Dextrose Inj 5%: INTRAVENOUS | Qty: 50 | Status: AC

## 2012-01-03 NOTE — Progress Notes (Signed)
TR BAND REMOVAL  LOCATION:    right radial  DEFLATED PER PROTOCOL:    yes  TIME BAND OFF / DRESSING APPLIED:    1515   SITE UPON ARRIVAL:    Level 0  SITE AFTER BAND REMOVAL:    Level 0  REVERSE ALLEN'S TEST:     positive  CIRCULATION SENSATION AND MOVEMENT:    Within Normal Limits   yes  COMMENTS:   Tolerated procedure well

## 2012-01-03 NOTE — Progress Notes (Signed)
Inpatient Diabetes Program Recommendations  AACE/ADA: New Consensus Statement on Inpatient Glycemic Control (2013)  Target Ranges:  Prepandial:   less than 140 mg/dL      Peak postprandial:   less than 180 mg/dL (1-2 hours)      Critically ill patients:  140 - 180 mg/dL   Reason for Visit: Results for [name], [name] (MRN [id number]) as of 01/03/2012 12:48  Ref. Range 01/02/2012 11:24 01/02/2012 16:49 01/02/2012 20:58 01/03/2012 07:42 01/03/2012 12:17  Glucose-Capillary Latest Range: 70-99 mg/dL 201 (H) 246 (H) 260 (H) 179 (H) 161 (H)   Please check A1C to determine pre-hospitalization glycemic control.  Consider adding Novolog 3 units tid with meals to cover carbohydrate intake (hold if patient eats less than 50%). Will follow.

## 2012-01-03 NOTE — Interval H&P Note (Signed)
History and Physical Interval Note:  01/03/2012 10:24 AM  [name]  has presented today for surgery, with the diagnosis of blockages  The various methods of treatment have been discussed with the patient and family. After consideration of risks, benefits and other options for treatment, the patient has consented to  Procedure(s) (LRB) with comments: PERCUTANEOUS CORONARY STENT INTERVENTION (PCI-S) (N/A) as a surgical intervention .  The patient's history has been reviewed, patient examined, no change in status, stable for surgery.  I have reviewed the patient's chart and labs.  Questions were answered to the patient's satisfaction.     Collier Salina [facility] 01/03/2012 10:24 AM

## 2012-01-03 NOTE — Progress Notes (Signed)
Utilization Review Completed.  

## 2012-01-03 NOTE — CV Procedure (Signed)
   CARDIAC CATH NOTE  Name: [name] MRN: [id number] DOB: 05-18-1931  Procedure: PTCA and stenting of the saphenous vein graft to the right coronary with cutting balloon angioplasty of the ostium of the PDA.  Indication: 78 year old white male with history of coronary disease status post prior coronary bypass surgery presents with unstable angina. Diagnostic cardiac catheterization demonstrates a 95% stenosis in the saphenous vein graft to the right coronary. There is also a 90% stenosis at the ostium of the PDA. His other grafts were patent. PCI was recommended.  Procedural Details: The right wrist was prepped, draped, and anesthetized with 1% lidocaine. Using the modified Seldinger technique, a 6 Fr sheath was introduced into the radial artery. 3 mg verapamil was administered through the radial sheath. Weight-based bivalirudin was given for anticoagulation. Once a therapeutic ACT was achieved, a 6 [place] RCB guide catheter was inserted.  A filter wire EZ coronary guidewire was used to cross the lesion. The filter was deployed in the distal vein graft. The lesion was predilated with a 2.5 mm balloon.  The lesion was then stented with a 3.5 x 16 mm Promus stent.  The stent was postdilated with a 3.75 mm noncompliant balloon in a focal area of incomplete expansion.  Following PCI, there was 0% residual stenosis and TIMI-3 flow. We then retrieved the filter wire. An [employer] medium coronary guidewire was used to cross the PDA lesion. The ostium of the PDA was dilated with a 2.25 x 6 mm cutting balloon to 6 and then 8 atmospheres. Postintervention there was less than 10% residual stenosis. Final angiography confirmed an excellent result. The patient tolerated the procedure well. There were no immediate procedural complications. A TR band was used for radial hemostasis. The patient was transferred to the post catheterization recovery area for further monitoring.  Lesion Data: Vessel: Saphenous vein graft  to the RCA Percent stenosis (pre): 95% TIMI-flow (pre):  3 Stent:  3.5 x 16 mm Promus Percent stenosis (post): 0% TIMI-flow (post): 3  Vessel #2: Ostial PDA Percent stenosis (pre-): 90% TIMI flow (pre-) 3 PCI: Cutting balloon angioplasty Percent stenosis (post) less than 10% TIMI flow (post) 3  Conclusions: Successful intracoronary stenting of the saphenous vein graft to the right coronary with a drug-eluting stent. Successful cutting balloon angioplasty of the ostium of the PDA.  Recommendations: Continue dual antiplatelet therapy for one year  [name] 01/03/2012, 11:35 AM

## 2012-01-03 NOTE — H&P (View-Only) (Signed)
Patient ID: [name], male   DOB: [birth year], 78 y.o.   MRN: [id number]    Subjective:  Denies SSCP, palpitations or Dyspnea Groin feels fine  Objective:  Filed Vitals:   01/01/12 0954 01/01/12 1511 01/01/12 2100 01/02/12 0500  BP: 166/85 165/97 160/88 156/97  Pulse: 87 88 90 77  Temp: 97.7 F (36.5 C) 97.9 F (36.6 C) 98.5 F (36.9 C) 97.6 F (36.4 C)  TempSrc: Oral Oral    Resp: 18 18 21 18   Height:      Weight: 220 lb 4.8 oz (99.927 kg)   209 lb 7 oz (95 kg)  SpO2: 94% 97% 95% 95%    Intake/Output from previous day:  Intake/Output Summary (Last 24 hours) at 01/02/12 0919 Last data filed at 01/01/12 2100  Gross per 24 hour  Intake    480 ml  Output    200 ml  Net    280 ml    Physical Exam: Affect appropriate Healthy:  appears stated age HEENT: normal Neck supple with no adenopathy JVP normal no bruits no thyromegaly Lungs clear with no wheezing and good diaphragmatic motion Heart:  S1/S2 no murmur, no rub, gallop or click PMI normal Abdomen: benighn, BS positve, no tenderness, no AAA no bruit.  No HSM or HJR Distal pulses intact with no bruits No edema Neuro non-focal Skin warm and dry No muscular weakness   Lab Results: Basic Metabolic Panel:  Basename 01/01/12 0715 12/31/11 0249  NA 140 135  K 3.3* 3.9  CL 103 96  CO2 26 27  GLUCOSE 179* 252*  BUN 18 22  CREATININE 1.32 1.55*  CALCIUM 9.7 10.8*  MG -- --  PHOS -- --   Liver Function Tests:  [facility] 12/31/11 0249  AST 16  ALT 14  ALKPHOS 50  BILITOT 0.5  PROT 7.8  ALBUMIN 3.7   No results found for this basename: LIPASE:2,AMYLASE:2 in the last 72 hours CBC:  Basename 01/01/12 0715 12/31/11 0249  WBC 8.0 10.3  NEUTROABS -- 8.2*  HGB 11.8* 12.3*  HCT 35.0* 35.8*  MCV 83.7 82.7  PLT 181 202   Cardiac Enzymes:  Basename 12/31/11 2117 12/31/11 1007 12/31/11 0259  CKTOTAL 73 86 --  CKMB 3.4 4.1* --  CKMBINDEX -- -- --  TROPONINI <0.30 <0.30 <0.30    Imaging: No  results found.  Cardiac Studies:  ECG: NSR no ischemia 9/27   Telemetry: NSR no VT 01/02/2012   Echo:   Medications:      . amLODipine  10 mg Oral Daily  . aspirin EC  81 mg Oral Daily  . gabapentin  900 mg Oral BID  . insulin aspart  0-15 Units Subcutaneous TID WC  . insulin aspart  0-5 Units Subcutaneous QHS  . losartan  100 mg Oral Daily  . pantoprazole  40 mg Oral Q1200  . predniSONE  10 mg Oral Q breakfast  . simvastatin  20 mg Oral q1800  . sodium chloride  3 mL Intravenous Q12H  . Ticagrelor  180 mg Oral Once  . Ticagrelor  90 mg Oral BID  . vitamin B-12  1,000 mcg Oral Daily  . DISCONTD: insulin aspart  0-15 Units Subcutaneous TID WC  . DISCONTD: insulin aspart  0-15 Units Subcutaneous TID WC       Assessment/Plan:  CAD:  Unstable angina 95% SVG RCA lesion For PCI Monday   Brilinta.  Groin looks Fine this am.  Heparin if more pain Dyspnea:  Chronic  EF ok and right heart pressures ok at cath.  Echo reviewed EF 50-55% minimal AS HTN:  Continue current meds.  Got labatolol in lab Rheumatoid:  ? Lung involvement on steroids per pulmonary  [name] 01/02/2012, 9:19 AM

## 2012-01-04 ENCOUNTER — Encounter (HOSPITAL_COMMUNITY): Payer: Self-pay | Admitting: Physician Assistant

## 2012-01-04 LAB — CBC
MCH: 28.5 pg (ref 26.0–34.0)
MCHC: 34.2 g/dL (ref 30.0–36.0)
MCV: 83.4 fL (ref 78.0–100.0)
Platelets: 188 10*3/uL (ref 150–400)

## 2012-01-04 LAB — BASIC METABOLIC PANEL
Calcium: 9.7 mg/dL (ref 8.4–10.5)
Creatinine, Ser: 1.29 mg/dL (ref 0.50–1.35)
GFR calc non Af Amer: 51 mL/min — ABNORMAL LOW (ref >90)
Glucose, Bld: 214 mg/dL — ABNORMAL HIGH (ref 70–99)
Sodium: 137 mEq/L (ref 135–145)

## 2012-01-04 LAB — GLUCOSE, CAPILLARY: Glucose-Capillary: 177 mg/dL — ABNORMAL HIGH (ref 70–99)

## 2012-01-04 LAB — POCT ACTIVATED CLOTTING TIME: Activated Clotting Time: 349 seconds

## 2012-01-04 MED ORDER — TICAGRELOR 90 MG PO TABS: 90.0000 mg | ORAL_TABLET | Freq: Two times a day (BID) | ORAL | Status: DC

## 2012-01-04 MED ORDER — CARVEDILOL 6.25 MG PO TABS: 6.2500 mg | ORAL_TABLET | Freq: Two times a day (BID) | ORAL | Status: DC

## 2012-01-04 MED FILL — Dextrose Inj 5%: INTRAVENOUS | Qty: 50 | Status: AC

## 2012-01-04 NOTE — Discharge Summary (Signed)
Discharge Summary   Patient ID: [name],  MRN: [id number], DOB/AGE: 1931/07/04 78 y.o.  Admit date: 12/31/2011 Discharge date: 01/04/2012  Primary Physician: [name] Simpler, MD Primary Cardiologist: [name] Rouge, MD  Discharge Diagnoses Principal Problem:  *Chest pain Active Problems:  HYPERLIPIDEMIA  OBSTRUCTIVE SLEEP APNEA  HYPERTENSION  AORTIC STENOSIS  Reflux esophagitis  GERD  DIABETES MELLITUS, WITH VASCULAR COMPLICATIONS  Chronic kidney disease, stage III (moderate)   Allergies Allergies  Allergen Reactions  . Doxazosin Mesylate Other (See Comments)    REACTION: dizzy  . Methocarbamol Rash    "don't remember how bad"    Diagnostic Studies/Procedures  PA/LATERAL CHEST X-RAY - 12/31/11  No active cardiopulmonary disease.   CARDIAC CATHETERIZATION - ANGIOGRAPHY - 12/31/11  Hemodynamics  RA 8  RV 32 10  PA 37 19  PCWP 17  LV 180 5  AO 171 87  Oxygen saturations:  PA 93  AO 68  Cardiac Output (Fick) 7.22  Cardiac Index (Fick) 3.18  Coronary angiography:  Coronary dominance: right  Left mainstem: 80% distal LM  Left anterior descending (LAD): 100%  Left circumflex (LCx): 100%  Right coronary artery (RCA): 100%  SVG: large OM 30% tubular disease proximally  SVG: D1 normal  SVG: RCA 99% stenosis distally with 80% ostial PDA lesion distal to graft  LIMA: to LAD normal with some left to right collaterals  Left ventriculography: Left ventricular systolic function is mildly decreased with posterior basal akinesis and no significant mitral regurgitation  Conclusions: Planned PCI.   2D ECHOCARDIOGRAM - 12/31/11  Left ventricle: The cavity size was normal. Wall thickness was normal. Systolic function was normal. The estimated ejection fraction was in the range of 50% to 55%. Doppler parameters are consistent with abnormal left ventricular relaxation (grade 1 diastolic dysfunction). - Aortic valve: Valve mobility was restricted. Valve  area: 1.61cm^2(VTI). Valve area: 1.88cm^2 (Vmax). - Left atrium: The atrium was mildly dilated. 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 Impressions:  - Technically difficult; LV function appears to low normal to mildly reduced; focal wall motion abnormality cannot be excluded; aortic valve heavily calcified but no significant AS by doppler; suggest TEE to better assess if clinically indicated.  CARDIAC CATHETERIZATION - PCI - 01/03/12  Vessel: Saphenous vein graft to the RCA  Percent stenosis (pre): 95%  TIMI-flow (pre): 3  Stent: 3.5 x 16 mm Promus  Percent stenosis (post): 0%  TIMI-flow (post): 3  Vessel #2: Ostial PDA  Percent stenosis (pre-): 90%  TIMI flow (pre-) 3  PCI: Cutting balloon angioplasty  Percent stenosis (post) less than 10%  TIMI flow (post) 3  Conclusions: Successful intracoronary stenting of the saphenous vein graft to the right coronary with a drug-eluting stent. Successful cutting balloon angioplasty of the ostium of the PDA.  History of Present Illness  [name] is a 78yo Caucasian male with the above problem list who was admitted to [facility] on 01/02/12 for chest pain concerning for unstable angina.   He has a history of CAD (s/p prior CABG x 5 in 2002; cath 2009 with patent grafts, normal EF; low risk Myoview, inferobasal wall scar w/o ischemia, EF 45% 02/13). He had last followed up with [name] earlier in September this year. He endorsed chronic dyspnea, which was believed to be attributed to interstitial lung disease over cardiac etiologies. Additionally, he noted atypical chest pain described as a GI source.   The night prior to admission, he reported experiencing substernal chest pain  at rest without radiation and associated with some shortness of breath and diaphoresis relieved with NTG. The pain returned, and was relieved again by NTG. He was able to fall asleep, but was awoken by the same discomfort only more severe at  a 10/10 thus prompting his ED presentation.   There, EKG and initial trop-I revealed no acute ischemic changes. CXR as above revealed no active cardiopulmonary disease.   Hospital Course   Given his cardiac history and HPI concerning for unstable angina, he was informed, consented and prepped for cardiac catheterization which is detailed in full above. This was notable for 80% distal LM, 100% native LAD, LCx and RCA, 30% prox SVG-OM, SVG-D1 normal, 99% distal, 80% ostial SVG-RCA distal to graft, LIMA-LAD normal; LVEF mildly decreased with posterior basal AK. The patient tolerated the procedure well without complications. The decision was made for PCI.   He was loaded with Brilinta and started on heparin in the holding area. His PCI was delayed due to a heavy schedule in the cath lab. There was noted continued brisk oozing from the initial femoral artery groin site necessitating lidocaine with epi injection and prolonged manual pressure which achieved hemostasis. The patient was prepped for repeat cath + PCI. Sheath systems were changed. A large R groin hematoma was visualized secondary to the non sheath puncture sight. After a long discussion with the patient, PCI was delayed given risk of groin site complication.   He was admitted to 6500 for close monitoring. ASA/Brilinta were continued. Heparin was held. A 2D echocardiogram was performed as detailed above. This revealed LVEF [id number], grade 1 diastolic dysfunction, mild LA dilatation, atrial septal aneurysm, AV mobility restricted, but no significant AS by doppler. Given the patient's preserved EF and normal R heart pressures during initial cath, his chronic dyspnea was suspected to be secondary to his underlying ILD. There was a suspicion that this was attributed rheumatoid arthritis. Of note, he had been started on steroids as an outpatient, and as a result, CBCs returned a mildly elevated WBC count.   His groin site remained stable and he was  asymptomatic over the weekend.   He was restarted on heparin prior to PCI on 01/03/12. He was informed, consented and prepped for cath. Access was achieved via the R wrist. The full details are noted above. This resulted in successful DES to SVG-RCA and cutting balloon angioplasty to ostial PDA. He tolerated this well without complications. He was transferred to 6500 and remained stable overnight. He will be discharged today. He will follow-up in the office as noted below. His pressures were elevated this morning, and he will be discharged on Coreg given CAD. He will continue ASA/Brilinta x 1 year. Additional cardiac meds include ARB, statin, NTG SL PRN. This information, including activity restrictions and groin/wrist site care, has been clearly outlined in the discharge AVS.   Discharge Vitals:  Blood pressure 168/69, pulse 86, temperature 97.7 F (36.5 C), temperature source Oral, resp. rate 23, height 6\' 1"  (1.854 m), weight 96.8 kg (213 lb 6.5 oz), SpO2 96.00%.   Labs: Recent Labs  [facility] 01/04/12 0620 01/03/12 0555   WBC 11.3* 11.2*   HGB 12.5* 11.9*   HCT 36.6* 35.0*   MCV 83.4 82.9   PLT 188 200    Lab 01/04/12 0620 01/03/12 0555 01/01/12 0715 12/31/11 0249  NA 137 139 140 --  K 4.5 3.2* 3.3* --  CL 101 103 103 --  CO2 25 23 26  --  BUN 18 21  18 --  CREATININE 1.29 1.42* 1.32 --  CALCIUM 9.7 9.5 9.7 --  PROT -- -- -- 7.8  BILITOT -- -- -- 0.5  ALKPHOS -- -- -- 50  ALT -- -- -- 14  AST -- -- -- 16  AMYLASE -- -- -- --  LIPASE -- -- -- --  GLUCOSE 214* 169* 179* --   Disposition:  Discharge Orders    Future Appointments: Provider: Department: Dept Phone: Center:   01/18/2012 12:10 PM Liliane Shi, [place] [phone] [facility]   02/08/2012 9:00 AM [name], [place] [phone] [facility]   02/16/2012 11:15 AM [name], MD [place] [phone] [facility]   02/22/2012 9:00 AM [name], MD  [facility] [phone] [facility]     Follow-up Information    Follow up with Richardson Dopp, PA. On 01/18/2012. (At 12:10 PM for follow-up after this hospitalization.)    Contact information:   [id number] [address] [place] [address] [phone]       Follow up with [name] Rouge, MD. On 02/16/2012. (At 11:15 AM for follow-up.  )    Contact information:   [id number] [address] [place], [place] [address] [phone]       Follow up with [name] Simpler, MD. (Please schedule an appointment with your PCP in 1-2 weeks for diabetes management. )    Contact information:   [place] [address] [phone]         Discharge Medications:    Medication List     As of 01/04/2012  9:07 AM    START taking these medications         carvedilol 6.25 MG tablet   Commonly known as: COREG   Take 1 tablet (6.25 mg total) by mouth 2 (two) times daily with a meal.      Ticagrelor 90 MG Tabs tablet   Commonly known as: BRILINTA   Take 1 tablet (90 mg total) by mouth 2 (two) times daily.      CONTINUE taking these medications         amLODipine 10 MG tablet   Commonly known as: NORVASC   Take 1 tablet (10 mg total) by mouth daily.      aspirin 81 MG tablet      furosemide 20 MG tablet   Commonly known as: LASIX      gabapentin 300 MG capsule   Commonly known as: NEURONTIN   TAKE 3 CAPSULES BY MOUTH TWICE A DAY      losartan 100 MG tablet   Commonly known as: COZAAR   Take 1 tablet (100 mg total) by mouth daily.      metFORMIN 1000 MG tablet   Commonly known as: GLUCOPHAGE   Take 1 tablet (1,000 mg total) by mouth 2 (two) times daily with a meal.      nitroGLYCERIN 0.4 MG SL tablet   Commonly known as: NITROSTAT   Place 1 tablet (0.4 mg total) under the tongue every 5 (five) minutes as needed. Chest pain      nortriptyline 25 MG capsule   Commonly known as: PAMELOR   Take 1-2 capsules (25-50 mg  total) by mouth at bedtime.      omeprazole 20 MG tablet   Commonly known as: PRILOSEC OTC      pravastatin 40 MG tablet   Commonly known as: PRAVACHOL   Take 1 tablet (  40 mg total) by mouth daily.      predniSONE 5 MG Tabs   Commonly known as: STERAPRED UNI-PAK      vitamin B-12 1000 MCG tablet   Commonly known as: CYANOCOBALAMIN          Where to get your medications    These are the prescriptions that you need to pick up. We sent them to a specific pharmacy, so you will need to go there to get them.   [id number] [place], [place] - [address]    [address] [address]    Phone: [phone]        carvedilol 6.25 MG tablet   Ticagrelor 90 MG Tabs tablet           Outstanding Labs/Studies: None  Duration of Discharge Encounter: Greater than 30 minutes including physician time.  Signed, R. Valeria Batman, PA-C 01/04/2012, 9:07 AM

## 2012-01-04 NOTE — Progress Notes (Signed)
Patient Name: [name] Date of Encounter: 01/04/2012     Principal Problem:  *Chest pain Active Problems:  HYPERLIPIDEMIA  OBSTRUCTIVE SLEEP APNEA  HYPERTENSION  AORTIC STENOSIS  Reflux esophagitis  GERD  DIABETES MELLITUS, WITH VASCULAR COMPLICATIONS  Chronic kidney disease, stage III (moderate)    SUBJECTIVE: Feels well. No chest pain, shortness of breath or palpitations.    OBJECTIVE  Filed Vitals:   01/03/12 1600 01/03/12 1930 01/04/12 0100 01/04/12 0428  BP: 157/73 162/74 148/71 168/69  Pulse: 60 84 80 86  Temp: 97.5 F (36.4 C) 97.9 F (36.6 C) 97.7 F (36.5 C) 97.7 F (36.5 C)  TempSrc: Oral Oral Oral Oral  Resp: 11 22 21 23   Height:      Weight:   96.8 kg (213 lb 6.5 oz)   SpO2: 95% 95% 95% 96%    Intake/Output Summary (Last 24 hours) at 01/04/12 [id number] Last data filed at 01/04/12 0500  Gross per 24 hour  Intake 1207.2 ml  Output   1350 ml  Net -142.8 ml   Weight change: 2.2 kg (4 lb 13.6 oz)  PHYSICAL EXAM  General: Well developed, well nourished, in no acute distress. Head: Normocephalic, atraumatic, sclera non-icteric, no xanthomas, nares are without discharge.  Neck: Supple without bruits or JVD. Lungs:  Resp regular and unlabored, CTAB without wheezes, rales or rhonchi Heart: III/VI systolic cres-decres murmur at RUSB, RRR no s3, s4 Abdomen: Soft, non-tender, non-distended, BS + x 4.  Msk:  Strength and tone appears normal for age. Extremities: No clubbing, cyanosis or edema. DP/PT/Radials 2+ and equal bilaterally. Neuro: Alert and oriented X 3. Moves all extremities spontaneously. Psych: Normal affect.  LABS:  Recent Labs  [facility] 01/04/12 0620 01/03/12 0555   WBC 11.3* 11.2*   HGB 12.5* 11.9*   HCT 36.6* 35.0*   MCV 83.4 82.9   PLT 188 200   Lab 01/04/12 0620 01/03/12 0555 01/01/12 0715 12/31/11 0249  NA 137 139 140 --  K 4.5 3.2* 3.3* --  CL 101 103 103 --  CO2 25 23 26  --  BUN 18 21 18  --  CREATININE 1.29 1.42*  1.32 --  CALCIUM 9.7 9.5 9.7 --  PROT -- -- -- 7.8  BILITOT -- -- -- 0.5  ALKPHOS -- -- -- 50  ALT -- -- -- 14  AST -- -- -- 16  AMYLASE -- -- -- --  LIPASE -- -- -- --  GLUCOSE 214* 169* 179* --    TELE: NSR, 60-90 bpm, occasional PVCs  ECG: NSR, 73 bpm, LAE, no ST/T wave changes   Radiology/Studies:  Dg Chest 2 View  12/31/2011  *RADIOLOGY REPORT*  Clinical Data: Chest pain.  Short of breath.  Elevated blood pressure.  History of CABG.  CHEST - 2 VIEW  Comparison: CT chest 11/02/2011.  Chest radiograph 09/09/2011.  Findings: Stable appearance of the chest.  CABG. Monitoring leads are projected over the chest.  Right basilar scarring/atelectasis inferior to the hemidiaphragm.  No airspace disease.  No effusion. Trachea midline.  IMPRESSION: No active cardiopulmonary disease or interval change.   Original Report Authenticated By: Dereck Ligas, M.D.     Current Medications:     . amLODipine  10 mg Oral Daily  . aspirin EC  81 mg Oral Daily  . bivalirudin      . bivalirudin      . clopidogrel      . fentaNYL      . fentaNYL      .  gabapentin  900 mg Oral BID  . heparin      . heparin      . insulin aspart  0-15 Units Subcutaneous TID WC  . insulin aspart  0-5 Units Subcutaneous QHS  . lidocaine      . losartan  100 mg Oral Daily  . midazolam      . midazolam      . nitroGLYCERIN      . pantoprazole  40 mg Oral Q1200  . potassium chloride  40 mEq Oral Once  . predniSONE  10 mg Oral Q breakfast  . simvastatin  20 mg Oral q1800  . sodium chloride  3 mL Intravenous Q12H  . Ticagrelor  90 mg Oral BID  . verapamil      . verapamil      . vitamin B-12  1,000 mcg Oral Daily  . DISCONTD: sodium chloride  3 mL Intravenous Q12H  . DISCONTD: Ticagrelor  180 mg Oral Once    ASSESSMENT AND PLAN:  1. CAD/unstable angina- PCI performed yesterday resulting in successful placement of DES to of SVG-RCA and cutting balloon angioplasty to ostial PDA.  Feels well this morning.  Continue DAPT- Brilinta/ASA x 1 year. Continue ARB. Add BB (certainly has enough pressure). Ambulate with assistance and anticipate discharge if tolerates well.   2. Type 2 DM- on Metformin outpatient. Diabetes coordinator recommends Hgb A1C. Follow-up with PCP for further glycemic control.   3. HTN- elevated this AM requiring Hydralazine PRN. Add BB for further BP control.   4. HL- continue statin.   5. Rheumatoid arthritis- on steroid for questionable pulmonary involvement. Mild leukocytosis associated with this.   6. GERD- stable, continue PPI.     Signed, R. Valeria Batman, PA-C 01/04/2012, 7:28 AM

## 2012-01-04 NOTE — Care Management Note (Signed)
    Page 1 of 1   01/04/2012     11:01:36 AM   CARE MANAGEMENT NOTE 01/04/2012  Patient:  [name], [name]   Account Number:  [id number]  Date Initiated:  01/04/2012  Documentation initiated by:  Llana Aliment  Subjective/Objective Assessment:   78yo male admitted with Chest Pain.  Pt. lives at home with spouse.     Action/Plan:   Discharge planning   Anticipated DC Date:  01/04/2012   Anticipated DC Plan:  [place]  CM consult  Medication Assistance      Choice offered to / List presented to:             Status of service:  Completed, signed off Medicare Important Message given?   (If response is "NO", the following Medicare IM given date fields will be blank) Date Medicare IM given:   Date Additional Medicare IM given:    Discharge Disposition:  HOME/SELF CARE  Per UR Regulation:  Reviewed for med. necessity/level of care/duration of stay  If discussed at [place] of Stay Meetings, dates discussed:    Comments:  01/04/12 [name], RN, BSN NCM [phone] Spoke with pt. about co-pay for [place].  Pt. has [employer], and he is eligible to acquire the 30day free rx. Pt. has 30day card.  He will be dc home today with family.

## 2012-01-04 NOTE — Progress Notes (Signed)
CARDIAC REHAB PHASE I   PRE:  Rate/Rhythm: 95 SR PVC's  BP:  Supine: 160/87  Sitting:   Standing:    SaO2:   MODE:  Ambulation: 1000 ft   POST:  Rate/Rhythem: 118 ST PVC's  BP:  Supine:   Sitting: 160/73  Standing:    SaO2:  0800-09010 Tolerated ambulation well without c/o of cp. Some DOE noted but, pt did  not mention it. Gait steady. Completed discharge education with pt and sister. Pt agrees to [place]. [facility] in [place], will send referral.  Deon Pilling

## 2012-01-04 NOTE — Progress Notes (Signed)
Pt's bp 170/90 at 0440.  Hydralazine 10mg  iv given at 0500.  Bp at 0530 151/75.

## 2012-01-04 NOTE — Progress Notes (Signed)
Patient examined. No angina  Right radial and right FA look fine  D/C home  F/U with PA in 3-4 weeks and me in 6-8 weeks  [name]

## 2012-01-18 ENCOUNTER — Ambulatory Visit (INDEPENDENT_AMBULATORY_CARE_PROVIDER_SITE_OTHER): Payer: Medicare Other | Admitting: Physician Assistant

## 2012-01-18 ENCOUNTER — Encounter: Payer: Self-pay | Admitting: Physician Assistant

## 2012-01-18 ENCOUNTER — Ambulatory Visit (INDEPENDENT_AMBULATORY_CARE_PROVIDER_SITE_OTHER): Payer: Medicare Other

## 2012-01-18 VITALS — BP 170/94 | HR 80 | Ht 73.0 in | Wt 214.0 lb

## 2012-01-18 DIAGNOSIS — E785 Hyperlipidemia, unspecified: Secondary | ICD-10-CM

## 2012-01-18 DIAGNOSIS — I25810 Atherosclerosis of coronary artery bypass graft(s) without angina pectoris: Secondary | ICD-10-CM

## 2012-01-18 DIAGNOSIS — R0602 Shortness of breath: Secondary | ICD-10-CM

## 2012-01-18 DIAGNOSIS — Z23 Encounter for immunization: Secondary | ICD-10-CM

## 2012-01-18 DIAGNOSIS — I10 Essential (primary) hypertension: Secondary | ICD-10-CM

## 2012-01-18 MED ORDER — CARVEDILOL 12.5 MG PO TABS: 12.5000 mg | ORAL_TABLET | Freq: Two times a day (BID) | ORAL | Status: DC

## 2012-01-18 NOTE — Patient Instructions (Addendum)
Your physician has recommended you make the following change in your medication: INCREASE COREG 12.5 MG 1 TABLET TWICE DAILY  Your physician recommends that you schedule a follow-up appointment in: 02/08/12 @ 9 AM WITH DR. Johnsie Cancel

## 2012-01-18 NOTE — Progress Notes (Signed)
[place], [place]  [address] Phone: [phone] Fax:  [phone]  Date:  01/18/2012   Name:  [name]   DOB:  July 24, 1931   MRN:  [id number]  PCP:  Viviana Simpler, MD  Primary Cardiologist:  Dr. [name]  Primary Electrophysiologist:  None    History of Present Illness: [name] is a 78 y.o. male who returns for follow up after recent hospitalization.  He has a history of CAD, s/p CABG, interstitial lung disease, mild aortic stenosis, HTN, HL, DM2, CKD, rheumatoid arthritis.  He has chronic dyspnea.  He was admitted 9/27-10/1 with chest pain. Myocardial infarction was ruled out.  Cardiac catheterization demonstrated high-grade disease in the SVG-RCA as well as ostial PDA distal to the graft.  He had some complications from his diagnostic cath with groin hematoma. He was brought back to the Cath Lab several days later for PCI with DES to the [facility] and cutting PTCA to the [place].  Dual antiplatelet therapy recommended for one year.   LHC 12/31/11:  dLM 80%, LAD 100%, circumflex 100%, RCA 100%, proximal SVG-OM 30%, SVG-D1 ok, SVG-RCA 99% distal and 80% ostial PDA distal to graft, LIMA-LAD ok with some left to right collaterals.  PCI 01/03/12: Promus DES to the SVG-RCA and Cutting Balloon angioplasty to the ostial PDA.  Echocardiogram 12/31/11: EF 99991111, grade 1 diastolic dysfunction, mild aortic stenosis, mean gradient 9, mild LAE.   Labs (10/13): K 4.5, creatinine 1.29, ALT 14, LDL 69, Hgb 12.5  Since d/c, he is doing well.  No chest pain.  Dyspnea improved.  Probably Class IIb.  No syncope.  No orthopnea, PND, edema.  Groin is doing ok.     Wt Readings from Last 3 Encounters:  01/18/12 214 lb (97.07 kg)  01/04/12 213 lb 6.5 oz (96.8 kg)  01/04/12 213 lb 6.5 oz (96.8 kg)     Past Medical History  Diagnosis Date  . Hyperlipidemia   . Hypertension   . GERD (gastroesophageal reflux disease)   . Arthritis     osteoarthritis, s/p  R TKR, and digits  . Overweight (BMI 25.0-29.9)     BMI 29  . Interstitial lung disease     NOS  . Pulmonary infiltrates 12-09    stable, 7/10: ? due to GERD  . Diverticulosis   . Hiatal hernia   . Esophageal stricture     s/p dilation spring 2010  . CAD (coronary artery disease)     symptoms followed lopressor Jan-Jul 09 and lisinopril increase July 09, 1/09 Myoview- non ischemic with an EF of 51% and inferobasal wall scar, 10/09 Cath- severe native three-vessel coronary artery disease, s/p multivessel coronary bypass graft with all grafts, Normal LV sys fn with normal LV filling pressures; 10/04-Adenosine cardiolite- thinning wo ischemia, EF 55%; 1/08 Cardiolite neg  . History of PFTs     mixed pattern on spiro. mild restn on lung volumes with near normal DLCO. Pattern can be explained by CABG scar. Fev1 2.2L/73%, ratio 68 (67), TLC 4.7/68%,RV 1.5L/55%,DLCO 79%  . Colon polyps   . Chronic kidney disease, stage III (moderate)   . Chronic diastolic CHF (congestive heart failure)     a) 09/13 ECHO- LVEF 99991111, grade 1 diastolic dysfunction, mild LA dilatation, atrial septal aneurysm, AV mobility restricted, but no sig AS by doppler; b) 09/04/08 ECHO- LVH, ef 60%, mild AS,  . Heart murmur   . Anginal pain   . Dyspnea 2009 since  July -Sept    05/06/08-CPST-  normal effort, reduced VO2 max 20.5 /65%, reduced at 8.2/ 40%, normal breathing resetvca of 55%, submaximal heart rate response 112/77%, flattened o2 pluse response at peak exercise-12 ml/beat @ 85%, No VQ mismatch abnormalities, All c/w CIRC Limitation  . Type II diabetes mellitus   . Chronic lower back pain     Current Outpatient Prescriptions  Medication Sig Dispense Refill  . amLODipine (NORVASC) 10 MG tablet Take 1 tablet (10 mg total) by mouth daily.  30 tablet  11  . aspirin 81 MG tablet Take 81 mg by mouth daily.        . carvedilol (COREG) 6.25 MG tablet Take 1 tablet (6.25 mg total) by mouth 2 (two) times daily with a meal.  60  tablet  3  . furosemide (LASIX) 20 MG tablet Take 20 mg by mouth daily.       [name] gabapentin (NEURONTIN) 300 MG capsule TAKE 3 CAPSULES BY MOUTH TWICE A DAY  180 capsule  5  . losartan (COZAAR) 100 MG tablet Take 1 tablet (100 mg total) by mouth daily.  30 tablet  11  . metFORMIN (GLUCOPHAGE) 1000 MG tablet Take 1 tablet (1,000 mg total) by mouth 2 (two) times daily with a meal.  60 tablet  11  . Multiple Vitamins-Minerals (ICAPS) CAPS Take by mouth. 1 TAB DAILY      . nitroGLYCERIN (NITROSTAT) 0.4 MG SL tablet Place 1 tablet (0.4 mg total) under the tongue every 5 (five) minutes as needed. Chest pain  25 tablet  4  . nortriptyline (PAMELOR) 25 MG capsule Take 1-2 capsules (25-50 mg total) by mouth at bedtime.  60 capsule  1  . omeprazole (PRILOSEC OTC) 20 MG tablet Take 20 mg by mouth daily.        . pravastatin (PRAVACHOL) 40 MG tablet Take 1 tablet (40 mg total) by mouth daily.  30 tablet  11  . predniSONE (STERAPRED UNI-PAK) 5 MG TABS Take 10 mg by mouth daily. Take 4 tablets daily for 2 weeks, then as directed.      . Ticagrelor (BRILINTA) 90 MG TABS tablet Take 1 tablet (90 mg total) by mouth 2 (two) times daily.  60 tablet  3  . vitamin B-12 (CYANOCOBALAMIN) 1000 MCG tablet Take 1,000 mcg by mouth daily.        Allergies: Allergies  Allergen Reactions  . Doxazosin Mesylate Other (See Comments)    REACTION: dizzy  . Methocarbamol Rash    "don't remember how bad"    History  Substance Use Topics  . Smoking status: Former Smoker -- 1.0 packs/day for 20 years    Types: Cigarettes    Quit date: 04/06/1963  . Smokeless tobacco: Never Used  . Alcohol Use: Yes     01/01/2012 "last alcohol ~ 50 yr ago"     ROS:  Please see the history of present illness.     All other systems reviewed and negative.   PHYSICAL EXAM: VS:  BP 170/94  Pulse 80  Ht 6\' 1"  (1.854 m)  Wt 214 lb (97.07 kg)  BMI 28.23 kg/m2 Well nourished, well developed, in no acute distress HEENT: normal Neck: no  JVD Cardiac:  normal S1, S2; RRR; 2/6 systolic murmur at RUSB Lungs:  clear to auscultation bilaterally, no wheezing, rhonchi or rales Abd: soft, nontender, no hepatomegaly Ext: no edema; right groin without hematoma or bruit; right wrist without hematoma or bruit  Skin: warm and dry Neuro:  CNs 2-12 intact, no focal abnormalities noted  EKG:  NSR, HR 80, LAD, PVC, inf Q waves, NSSTTW changes.       ASSESSMENT AND PLAN:  1. Coronary Artery Disease: He is doing well after PCI to his SVG-RCA and native PDA.  We discussed the importance of dual antiplatelet therapy.  Followup with Dr. Johnsie Cancel in one month as planned.  2. Hypertension: Uncontrolled. Increase carvedilol to 12.5 mg twice daily.  3. Hyperlipidemia: Continue current dose of statin. Recent LDL optimal.  4. Dyspnea: Likely multifactorial. He has some element of diastolic dysfunction as well as very mild aortic stenosis. He also has interstitial lung disease. Pulmonary component is likely the main cause for his dyspnea. He is seen by pulmonology. There is some concern for rheumatoid arthritis playing a role. He is also seen rheumatology.  Signed, Richardson Dopp, PA-C  1:21 PM 01/18/2012

## 2012-02-04 HISTORY — PX: CORONARY STENT PLACEMENT: SHX1402

## 2012-02-08 ENCOUNTER — Ambulatory Visit (INDEPENDENT_AMBULATORY_CARE_PROVIDER_SITE_OTHER): Payer: Medicare Other | Admitting: Cardiovascular Disease

## 2012-02-08 ENCOUNTER — Encounter: Payer: Self-pay | Admitting: Cardiovascular Disease

## 2012-02-08 VITALS — BP 148/67 | HR 64 | Wt 214.0 lb

## 2012-02-08 DIAGNOSIS — E1159 Type 2 diabetes mellitus with other circulatory complications: Secondary | ICD-10-CM

## 2012-02-08 DIAGNOSIS — I359 Nonrheumatic aortic valve disorder, unspecified: Secondary | ICD-10-CM

## 2012-02-08 DIAGNOSIS — E785 Hyperlipidemia, unspecified: Secondary | ICD-10-CM

## 2012-02-08 DIAGNOSIS — I2510 Atherosclerotic heart disease of native coronary artery without angina pectoris: Secondary | ICD-10-CM

## 2012-02-08 DIAGNOSIS — I10 Essential (primary) hypertension: Secondary | ICD-10-CM

## 2012-02-08 NOTE — Patient Instructions (Signed)
Your physician wants you to follow-up in:  6 MONTHS WITH DR NISHAN  You will receive a reminder letter in the mail two months in advance. If you don't receive a letter, please call our office to schedule the follow-up appointment. Your physician recommends that you continue on your current medications as directed. Please refer to the Current Medication list given to you today. 

## 2012-02-08 NOTE — Assessment & Plan Note (Signed)
Cholesterol is at goal.  Continue current dose of statin and diet Rx.  No myalgias or side effects.  F/U  LFT's in 6 months. Lab Results  Component Value Date   LDLCALC 69 01/01/2012

## 2012-02-08 NOTE — Assessment & Plan Note (Signed)
Stable with no angina and good activity level.  Continue medical Rx  

## 2012-02-08 NOTE — Assessment & Plan Note (Signed)
Well controlled.  Continue current medications and low sodium Dash type diet.    

## 2012-02-08 NOTE — Assessment & Plan Note (Signed)
Discussed low carb diet.  Target hemoglobin A1c is 6.5 or less.  Continue current medications.  

## 2012-02-08 NOTE — Progress Notes (Signed)
Patient ID: [name], male   DOB: 01-11-1932, 78 y.o.   MRN: [id number] [name] is a 78 y.o. male who returns for follow up after recent hospitalization.  He has a history of CAD, s/p CABG, interstitial lung disease, mild aortic stenosis, HTN, HL, DM2, CKD, rheumatoid arthritis. He has chronic dyspnea. He was admitted 9/27-10/1 with chest pain. Myocardial infarction was ruled out. Cardiac catheterization demonstrated high-grade disease in the SVG-RCA as well as ostial PDA distal to the graft. He had some complications from his diagnostic cath with groin hematoma. He was brought back to the Cath Lab several days later for PCI with DES to the [facility] and cutting PTCA to the [place]. Dual antiplatelet therapy recommended for one year.  LHC 12/31/11: dLM 80%, LAD 100%, circumflex 100%, RCA 100%, proximal SVG-OM 30%, SVG-D1 ok, SVG-RCA 99% distal and 80% ostial PDA distal to graft, LIMA-LAD ok with some left to right collaterals.  PCI 01/03/12: Promus DES to the SVG-RCA and Cutting Balloon angioplasty to the ostial PDA.  Echocardiogram 12/31/11: EF 99991111, grade 1 diastolic dysfunction, mild aortic stenosis, mean gradient 9, mild LAE.  Labs (10/13): K 4.5, creatinine 1.29, ALT 14, LDL 69, Hgb 12.5  Since d/c, he is doing well. No chest pain. Dyspnea improved. Probably Class IIb. No syncope. No orthopnea, PND, edema. Groin is doing ok.   ROS: Denies fever, malais, weight loss, blurry vision, decreased visual acuity, cough, sputum, SOB, hemoptysis, pleuritic pain, palpitaitons, heartburn, abdominal pain, melena, lower extremity edema, claudication, or rash.  All other systems reviewed and negative  General: Affect appropriate Healthy:  appears stated age 27: normal Neck supple with no adenopathy JVP normal no bruits no thyromegaly Lungs clear with no wheezing and good diaphragmatic motion Heart:  S1/S2 midl AS  murmur, no rub, gallop or click PMI normal Abdomen: benighn, BS positve, no  tenderness, no AAA no bruit.  No HSM or HJR Distal pulses intact with no bruits No edema Neuro non-focal Skin warm and dry No muscular weakness   Current Outpatient Prescriptions  Medication Sig Dispense Refill  . amLODipine (NORVASC) 10 MG tablet Take 1 tablet (10 mg total) by mouth daily.  30 tablet  11  . aspirin 81 MG tablet Take 81 mg by mouth daily.        . furosemide (LASIX) 20 MG tablet Take 20 mg by mouth daily.       [name] gabapentin (NEURONTIN) 300 MG capsule TAKE 3 CAPSULES BY MOUTH TWICE A DAY  180 capsule  5  . losartan (COZAAR) 100 MG tablet Take 1 tablet (100 mg total) by mouth daily.  30 tablet  11  . metFORMIN (GLUCOPHAGE) 1000 MG tablet Take 1 tablet (1,000 mg total) by mouth 2 (two) times daily with a meal.  60 tablet  11  . Multiple Vitamins-Minerals (ICAPS) CAPS Take by mouth. 1 TAB DAILY      . nitroGLYCERIN (NITROSTAT) 0.4 MG SL tablet Place 1 tablet (0.4 mg total) under the tongue every 5 (five) minutes as needed. Chest pain  25 tablet  4  . nortriptyline (PAMELOR) 25 MG capsule Take 1-2 capsules (25-50 mg total) by mouth at bedtime.  60 capsule  1  . omeprazole (PRILOSEC OTC) 20 MG tablet Take 20 mg by mouth daily.        . pravastatin (PRAVACHOL) 40 MG tablet Take 1 tablet (40 mg total) by mouth daily.  30 tablet  11  . predniSONE (STERAPRED UNI-PAK) 5 MG TABS  Take 10 mg by mouth daily. Take 4 tablets daily for 2 weeks, then as directed.      . Ticagrelor (BRILINTA) 90 MG TABS tablet Take 1 tablet (90 mg total) by mouth 2 (two) times daily.  60 tablet  3  . vitamin B-12 (CYANOCOBALAMIN) 1000 MCG tablet Take 1,000 mcg by mouth daily.        Allergies  Doxazosin mesylate and Methocarbamol  Electrocardiogram: 01/18/12 SR 32 pvc old IMI  Today    Assessment and Plan

## 2012-02-08 NOTE — Assessment & Plan Note (Signed)
Mild f/u echo in a year No change in murmur

## 2012-02-16 ENCOUNTER — Ambulatory Visit: Payer: Medicare Other | Admitting: Cardiovascular Disease

## 2012-02-22 ENCOUNTER — Ambulatory Visit (INDEPENDENT_AMBULATORY_CARE_PROVIDER_SITE_OTHER): Payer: Medicare Other | Admitting: Internal Medicine

## 2012-02-22 ENCOUNTER — Encounter: Payer: Self-pay | Admitting: Internal Medicine

## 2012-02-22 VITALS — BP 160/80 | HR 69 | Temp 97.5°F | Wt 216.0 lb

## 2012-02-22 DIAGNOSIS — I5032 Chronic diastolic (congestive) heart failure: Secondary | ICD-10-CM

## 2012-02-22 DIAGNOSIS — E1149 Type 2 diabetes mellitus with other diabetic neurological complication: Secondary | ICD-10-CM

## 2012-02-22 DIAGNOSIS — J8410 Pulmonary fibrosis, unspecified: Secondary | ICD-10-CM

## 2012-02-22 DIAGNOSIS — Z79899 Other long term (current) drug therapy: Secondary | ICD-10-CM

## 2012-02-22 DIAGNOSIS — J849 Interstitial pulmonary disease, unspecified: Secondary | ICD-10-CM

## 2012-02-22 DIAGNOSIS — I2510 Atherosclerotic heart disease of native coronary artery without angina pectoris: Secondary | ICD-10-CM

## 2012-02-22 DIAGNOSIS — E1159 Type 2 diabetes mellitus with other circulatory complications: Secondary | ICD-10-CM

## 2012-02-22 NOTE — Assessment & Plan Note (Signed)
Ongoing neuropathic symptoms Continues on the gabapentin

## 2012-02-22 NOTE — Assessment & Plan Note (Signed)
Hopefully still acceptable control Would try glipizide or januvia if well over 8%

## 2012-02-22 NOTE — Assessment & Plan Note (Signed)
Neutral fluid status  No med changes

## 2012-02-22 NOTE — Assessment & Plan Note (Signed)
Recent stent Seems to be symptom free Dyspnea probably pulmonary

## 2012-02-22 NOTE — Assessment & Plan Note (Signed)
Now on immunosuppresants

## 2012-02-22 NOTE — Progress Notes (Signed)
Subjective:    Patient ID: [name], male    DOB: Jun 24, 1931, 78 y.o.   MRN: [id number]  HPI Recent hospitalization Had cath and then stent later No chest pain since then  Breathing still bad Continues to see pulmonologist and now rheum [name] Now on azathioprine for interstitial lung disease Started multivitamin for anemia  Checks sugars 2-3 times per week 170-175 mostly No hypoglycemic reactions Had numbness and tingling in hands and pain in feet  Current Outpatient Prescriptions on File Prior to Visit  Medication Sig Dispense Refill  . aspirin 81 MG tablet Take 81 mg by mouth daily.        [name] azaTHIOprine (IMURAN) 50 MG tablet Take 50 mg by mouth 2 (two) times daily.      . furosemide (LASIX) 20 MG tablet Take 20 mg by mouth as needed.       . gabapentin (NEURONTIN) 300 MG capsule TAKE 3 CAPSULES BY MOUTH TWICE A DAY  180 capsule  5  . losartan (COZAAR) 100 MG tablet Take 1 tablet (100 mg total) by mouth daily.  30 tablet  11  . metFORMIN (GLUCOPHAGE) 1000 MG tablet Take 1 tablet (1,000 mg total) by mouth 2 (two) times daily with a meal.  60 tablet  11  . Multiple Vitamins-Minerals (ICAPS) CAPS Take by mouth daily.       . nitroGLYCERIN (NITROSTAT) 0.4 MG SL tablet Place 1 tablet (0.4 mg total) under the tongue every 5 (five) minutes as needed. Chest pain  25 tablet  4  . nortriptyline (PAMELOR) 25 MG capsule Take 1-2 capsules (25-50 mg total) by mouth at bedtime.  60 capsule  1  . omeprazole (PRILOSEC OTC) 20 MG tablet Take 20 mg by mouth daily.        . pravastatin (PRAVACHOL) 40 MG tablet Take 1 tablet (40 mg total) by mouth daily.  30 tablet  11  . predniSONE (STERAPRED UNI-PAK) 5 MG TABS as directed.       . Ticagrelor (BRILINTA) 90 MG TABS tablet Take 1 tablet (90 mg total) by mouth 2 (two) times daily.  60 tablet  3  . vitamin B-12 (CYANOCOBALAMIN) 1000 MCG tablet Take 1,000 mcg by mouth daily.      . [DISCONTINUED] amLODipine (NORVASC) 10 MG tablet Take 1  tablet (10 mg total) by mouth daily.  30 tablet  11    Allergies  Allergen Reactions  . Doxazosin Mesylate Other (See Comments)    REACTION: dizzy  . Methocarbamol Rash    "don't remember how bad"    Past Medical History  Diagnosis Date  . Hyperlipidemia   . Hypertension   . GERD (gastroesophageal reflux disease)   . Arthritis     osteoarthritis, s/p R TKR, and digits  . Overweight (BMI 25.0-29.9)     BMI 29  . Interstitial lung disease     NOS  . Pulmonary infiltrates 12-09    stable, 7/10: ? due to GERD  . Diverticulosis   . Hiatal hernia   . Esophageal stricture     s/p dilation spring 2010  . CAD (coronary artery disease)     symptoms followed lopressor Jan-Jul 09 and lisinopril increase July 09, 1/09 Myoview- non ischemic with an EF of 51% and inferobasal wall scar, 10/09 Cath- severe native three-vessel coronary artery disease, s/p multivessel coronary bypass graft with all grafts, Normal LV sys fn with normal LV filling pressures; 10/04-Adenosine cardiolite- thinning wo ischemia, EF 55%; 1/08 Cardiolite  neg  . History of PFTs     mixed pattern on spiro. mild restn on lung volumes with near normal DLCO. Pattern can be explained by CABG scar. Fev1 2.2L/73%, ratio 68 (67), TLC 4.7/68%,RV 1.5L/55%,DLCO 79%  . Colon polyps   . Chronic kidney disease, stage III (moderate)   . Chronic diastolic CHF (congestive heart failure)     a) 09/13 ECHO- LVEF 99991111, grade 1 diastolic dysfunction, mild LA dilatation, atrial septal aneurysm, AV mobility restricted, but no sig AS by doppler; b) 09/04/08 ECHO- LVH, ef 60%, mild AS,  . Heart murmur   . Anginal pain   . Dyspnea 2009 since July -Sept    05/06/08-CPST-  normal effort, reduced VO2 max 20.5 /65%, reduced at 8.2/ 40%, normal breathing resetvca of 55%, submaximal heart rate response 112/77%, flattened o2 pluse response at peak exercise-12 ml/beat @ 85%, No VQ mismatch abnormalities, All c/w CIRC Limitation  . Type II diabetes mellitus    . Chronic lower back pain     Past Surgical History  Procedure Date  . Total knee arthroplasty 03/2010    Right- [name]  . Cholecystectomy 11/2003    [name]- open  . Knee arthroscopy 2008    right  . Cataract extraction w/ intraocular lens  implant, bilateral   . Coronary artery bypass graft 11/1999    CABG X5  . Cardiac catheterization 5/06    CP- no MI, Cath- small vessell disease  . Cardiac catheterization 12/31/2011    80% distal LM, 100% native LAD, LCx and RCA, 30% prox SVG-OM, SVG-D1 normal, 99% distal, 80% ostial SVG-RCA distal to graft, LIMA-LAD normal; LVEF mildly decreased with posterior basal AK  . Ptca 01/03/2012    Successful DES to SVG-RCA and cutting balloon angioplasty ostial  PDA    Family History  Problem Relation Age of Onset  . COPD Mother   . Heart disease Father   . Diabetes Brother   . Colon cancer Brother   . Alcohol abuse Sister     History   Social History  . Marital Status: Married    Spouse Name: N/A    Number of Children: N/A  . Years of Education: N/A   Occupational History  . lawn mower     retired   Social History Main Topics  . Smoking status: Former Smoker -- 1.0 packs/day for 20 years    Types: Cigarettes    Quit date: 04/06/1963  . Smokeless tobacco: Never Used  . Alcohol Use: Yes     Comment: 01/01/2012 "last alcohol ~ 50 yr ago"  . Drug Use: No  . Sexually Active: Not Currently   Other Topics Concern  . Not on file   Social History Narrative   Has children   Review of Systems Gets some "mini-headaches" in frontal area---last 10-15 seconds. 1-3 episodes per day Weight fairly stable Sleeping okay Appetite is fine Bowels okay    Objective:   Physical Exam  Constitutional: He appears well-developed and well-nourished. No distress.  Neck: Normal range of motion. Neck supple.  Cardiovascular: Normal rate, regular rhythm, normal heart sounds and intact distal pulses.  Exam reveals no gallop.   No murmur  heard. Pulmonary/Chest: Effort normal. No respiratory distress. He has no wheezes. He has no rales.       Decreased breath sounds but clear  Musculoskeletal: He exhibits no edema.  Lymphadenopathy:    He has no cervical adenopathy.  Skin: No rash noted. No erythema.  No foot lesions  Psychiatric: He has a normal mood and affect. His behavior is normal.          Assessment & Plan:

## 2012-02-24 ENCOUNTER — Encounter: Payer: Self-pay | Admitting: *Deleted

## 2012-03-03 ENCOUNTER — Other Ambulatory Visit: Payer: Self-pay | Admitting: Internal Medicine

## 2012-03-16 ENCOUNTER — Other Ambulatory Visit: Payer: Self-pay | Admitting: Internal Medicine

## 2012-04-03 ENCOUNTER — Telehealth: Payer: Self-pay | Admitting: Internal Medicine

## 2012-04-03 NOTE — Telephone Encounter (Signed)
Scheduled pt appt at 8am 04/04/12 since staff meeting canceled.

## 2012-04-03 NOTE — Telephone Encounter (Signed)
Will evaluate at tomorrow's OV

## 2012-04-03 NOTE — Telephone Encounter (Signed)
Patient Information:  Caller Name: [place]  Phone: [phone]  Patient: [name], [name]  Gender: Male  DOB: 06/08/1931  Age: 78 Years  PCP: Viviana Simpler [facility])  Office Follow Up:  Does the office need to follow up with this patient?: Yes  Instructions For The Office: Has a cold with Sore Throat, Fever (does not have a thermometer but stated he was really hot and taking Tylenol) and Chest Congestion, needs to sleep up in a chair since Thursday,  03/30/2012. Disposition states to see today, NO APPOINTMENTS AVAILABLE.  RN Note:  Breathing hard at night especially and needs to sit in chair, sore throat and fever (does not have a thermometer but states he is really hot). NO APPOINTMENTS WERE AVAILABLE TODAY, NEED TO SEE TODAY DISPOSITION. Patient was advised to call back with any respiratory distresss.   Symptoms  Reason For Call & Symptoms: cold: sore throat, congestion, fever  Reviewed Health History In EMR: Yes  Reviewed Medications In EMR: Yes  Reviewed Allergies In EMR: Yes  Reviewed Surgeries / Procedures: No  Date of Onset of Symptoms: 03/30/2012  Treatments Tried: Tylenol and Robitussin cough medicine.  Treatments Tried Worked: Yes  Any Fever: Yes  Fever Taken: Tactile  Fever Time Of Reading: 07:30:00  Fever Last Reading: N/A  Guideline(s) Used:  Cold Symptoms (Upper Respiratory Infection)  Colds  Cough  Disposition Per Guideline:   See Today in Office  Reason For Disposition Reached:   Fever present > 3 days (72 hours)  Advice Given:  Reassurance  Coughing is the way that our lungs remove irritants and mucus. It helps protect our lungs from getting pneumonia.  You can get a dry hacking cough after a chest cold. Sometimes this type of cough can last 1-3 weeks, and be worse at night.  You can also get a cough after being exposed to irritating substances like smoke, strong perfumes, and dust.  Here is some care advice that should help.  Cough  Medicines:  Home Remedy - Honey: This old home remedy has been shown to help decrease coughing at night. The adult dosage is 2 teaspoons (10 ml) at bedtime. Honey should not be given to infants under one year of age.  Caution - Dextromethorphan:   CONTRAINDICATED: Do not take dextromethorphan if you are taking a monoamine oxidase (MAO) inhibitor now or in the past 2 weeks. Examples of MAO inhibitors include isocarboxazid (Marplan), phenelzine (Nardil), selegiline (Eldepryl, Emsam, Zelapar), and tranylcypromine (Parnate). Do not take dextromethorphan if you are taking venlafaxine (Effexor).  Coughing Spasms:  Drink warm fluids. Inhale warm mist (Reason: both relax the airway and loosen up the phlegm).  Suck on cough drops or hard candy to coat the irritated throat.  Prevent Dehydration:  Drink adequate liquids.  This will help soothe an irritated or dry throat and loosen up the phlegm.  Avoid Tobacco Smoke:  Smoking or being exposed to smoke makes coughs much worse.  Expected Course:   The expected course depends on what is causing the cough.  Viral bronchitis (chest cold) causes a cough that lasts 1 to 3 weeks. Sometimes you may cough up lots of phlegm (sputum, mucus). The mucus can normally be white, gray, yellow, or green.  Call Back If:  Difficulty breathing  Cough lasts more than 3 weeks  Fever lasts > 3 days  You become worse.  Reassurance  It sounds like an uncomplicated cold that we can treat at home.  Colds are usually not  serious.  Caution - Nasal Decongestants:  Do not take these medications if you have high blood pressure, heart disease, prostate problems, or an overactive thyroid.  Pain and Fever Medicines:  For pain or fever relief, take either acetaminophen or ibuprofen.  They are over-the-counter (OTC) drugs that help treat both fever and pain. You can buy them at the drugstore.  Call Back If:  Difficulty breathing occurs  Fever lasts more than 3 days

## 2012-04-04 ENCOUNTER — Encounter: Payer: Self-pay | Admitting: Internal Medicine

## 2012-04-04 ENCOUNTER — Ambulatory Visit (INDEPENDENT_AMBULATORY_CARE_PROVIDER_SITE_OTHER): Payer: Medicare Other | Admitting: Internal Medicine

## 2012-04-04 VITALS — BP 138/72 | HR 64 | Temp 98.2°F | Resp 20 | Wt 211.0 lb

## 2012-04-04 DIAGNOSIS — J209 Acute bronchitis, unspecified: Secondary | ICD-10-CM

## 2012-04-04 MED ORDER — HYDROCODONE-HOMATROPINE 5-1.5 MG/5ML PO SYRP: 5.0000 mL | ORAL_SOLUTION | Freq: Every evening | ORAL | Status: DC | PRN

## 2012-04-04 MED ORDER — AMOXICILLIN-POT CLAVULANATE 875-125 MG PO TABS: 1.0000 | ORAL_TABLET | Freq: Two times a day (BID) | ORAL | Status: DC

## 2012-04-04 NOTE — Progress Notes (Signed)
Subjective:    Patient ID: [name], male    DOB: 04-Jan-1932, 78 y.o.   MRN: [id number]  HPI Here with wife Sick since 6 days ago Awoke after Christmas feeling sick Some fever--felt hot. No sweats or shakes Lots of cough and sore throat Congestion in chest Cough productive of gray sputum  Some increase in his SOB No ear pain  Tried robitussin, mucinex and tylenol (this seemed to help fever)  Current Outpatient Prescriptions on File Prior to Visit  Medication Sig Dispense Refill  . amLODipine (NORVASC) 10 MG tablet TAKE 1 TABLET (10 MG TOTAL) BY MOUTH DAILY.  30 tablet  10  . aspirin 81 MG tablet Take 81 mg by mouth daily.        [name] azaTHIOprine (IMURAN) 50 MG tablet Take 50 mg by mouth 2 (two) times daily.      . carvedilol (COREG) 12.5 MG tablet Take 12.5 mg by mouth 2 (two) times daily with a meal.      . furosemide (LASIX) 20 MG tablet Take 20 mg by mouth as needed.       . gabapentin (NEURONTIN) 300 MG capsule TAKE 3 CAPSULES BY MOUTH TWICE A DAY  180 capsule  5  . losartan (COZAAR) 100 MG tablet TAKE 1 TABLET (100 MG TOTAL) BY MOUTH DAILY.  30 tablet  10  . metFORMIN (GLUCOPHAGE) 1000 MG tablet Take 1 tablet (1,000 mg total) by mouth 2 (two) times daily with a meal.  60 tablet  11  . Multiple Vitamins-Minerals (ICAPS) CAPS Take by mouth daily.       . nitroGLYCERIN (NITROSTAT) 0.4 MG SL tablet Place 1 tablet (0.4 mg total) under the tongue every 5 (five) minutes as needed. Chest pain  25 tablet  4  . nortriptyline (PAMELOR) 25 MG capsule Take 1-2 capsules (25-50 mg total) by mouth at bedtime.  60 capsule  1  . omeprazole (PRILOSEC OTC) 20 MG tablet Take 20 mg by mouth daily.        . pravastatin (PRAVACHOL) 40 MG tablet Take 1 tablet (40 mg total) by mouth daily.  30 tablet  11  . predniSONE (STERAPRED UNI-PAK) 5 MG TABS as directed.       . Ticagrelor (BRILINTA) 90 MG TABS tablet Take 1 tablet (90 mg total) by mouth 2 (two) times daily.  60 tablet  3  . vitamin B-12  (CYANOCOBALAMIN) 1000 MCG tablet Take 1,000 mcg by mouth daily.        Allergies  Allergen Reactions  . Doxazosin Mesylate Other (See Comments)    REACTION: dizzy  . Methocarbamol Rash    "don't remember how bad"    Past Medical History  Diagnosis Date  . Hyperlipidemia   . Hypertension   . GERD (gastroesophageal reflux disease)   . Arthritis     osteoarthritis, s/p R TKR, and digits  . Overweight (BMI 25.0-29.9)     BMI 29  . Interstitial lung disease     NOS  . Pulmonary infiltrates 12-09    stable, 7/10: ? due to GERD  . Diverticulosis   . Hiatal hernia   . Esophageal stricture     s/p dilation spring 2010  . CAD (coronary artery disease)     symptoms followed lopressor Jan-Jul 09 and lisinopril increase July 09, 1/09 Myoview- non ischemic with an EF of 51% and inferobasal wall scar, 10/09 Cath- severe native three-vessel coronary artery disease, s/p multivessel coronary bypass graft with all grafts, Normal  LV sys fn with normal LV filling pressures; 10/04-Adenosine cardiolite- thinning wo ischemia, EF 55%; 1/08 Cardiolite neg  . History of PFTs     mixed pattern on spiro. mild restn on lung volumes with near normal DLCO. Pattern can be explained by CABG scar. Fev1 2.2L/73%, ratio 68 (67), TLC 4.7/68%,RV 1.5L/55%,DLCO 79%  . Colon polyps   . Chronic kidney disease, stage III (moderate)   . Chronic diastolic CHF (congestive heart failure)     a) 09/13 ECHO- LVEF 99991111, grade 1 diastolic dysfunction, mild LA dilatation, atrial septal aneurysm, AV mobility restricted, but no sig AS by doppler; b) 09/04/08 ECHO- LVH, ef 60%, mild AS,  . Heart murmur   . Anginal pain   . Dyspnea 2009 since July -Sept    05/06/08-CPST-  normal effort, reduced VO2 max 20.5 /65%, reduced at 8.2/ 40%, normal breathing resetvca of 55%, submaximal heart rate response 112/77%, flattened o2 pluse response at peak exercise-12 ml/beat @ 85%, No VQ mismatch abnormalities, All c/w CIRC Limitation  . Type II  diabetes mellitus   . Chronic lower back pain     Past Surgical History  Procedure Date  . Total knee arthroplasty 03/2010    Right- [name]  . Cholecystectomy 11/2003    [name]- open  . Knee arthroscopy 2008    right  . Cataract extraction w/ intraocular lens  implant, bilateral   . Coronary artery bypass graft 11/1999    CABG X5  . Cardiac catheterization 5/06    CP- no MI, Cath- small vessell disease  . Cardiac catheterization 12/31/2011    80% distal LM, 100% native LAD, LCx and RCA, 30% prox SVG-OM, SVG-D1 normal, 99% distal, 80% ostial SVG-RCA distal to graft, LIMA-LAD normal; LVEF mildly decreased with posterior basal AK  . Ptca 01/03/2012    Successful DES to SVG-RCA and cutting balloon angioplasty ostial  PDA    Family History  Problem Relation Age of Onset  . COPD Mother   . Heart disease Father   . Diabetes Brother   . Colon cancer Brother   . Alcohol abuse Sister     History   Social History  . Marital Status: Married    Spouse Name: N/A    Number of Children: N/A  . Years of Education: N/A   Occupational History  . lawn mower     retired   Social History Main Topics  . Smoking status: Former Smoker -- 1.0 packs/day for 20 years    Types: Cigarettes    Quit date: 04/06/1963  . Smokeless tobacco: Never Used  . Alcohol Use: Yes     Comment: 01/01/2012 "last alcohol ~ 50 yr ago"  . Drug Use: No  . Sexually Active: Not Currently   Other Topics Concern  . Not on file   Social History Narrative   Has children   Review of Systems No body aches No vomiting or diarrhea Appetite fair    Objective:   Physical Exam  Constitutional: He appears well-developed and well-nourished. No distress.  HENT:  Mouth/Throat: Oropharynx is clear and moist. No oropharyngeal exudate.       TMs normal Moderate nasal inflammation--slight green mucus  Neck: Normal range of motion. Neck supple.  Pulmonary/Chest: Effort normal. No respiratory distress. He has no  wheezes. He has rales.       Fine bibasilar crackles  Lymphadenopathy:    He has no cervical adenopathy.          Assessment & Plan:

## 2012-04-04 NOTE — Assessment & Plan Note (Signed)
He has been sick almost a week, has underlying interstitial lung disease and is on immunosuppressants Clinical picture is not of pneumonia Will treat with augmentin and hydrocodone to give relief from the cough

## 2012-04-06 ENCOUNTER — Emergency Department (HOSPITAL_COMMUNITY): Payer: Medicare Other

## 2012-04-06 ENCOUNTER — Encounter (HOSPITAL_COMMUNITY): Payer: Self-pay | Admitting: Emergency Medicine

## 2012-04-06 ENCOUNTER — Emergency Department (HOSPITAL_COMMUNITY)
Admission: EM | Admit: 2012-04-06 | Discharge: 2012-04-07 | Disposition: A | Discharge status: Home / Self Care | Payer: Medicare Other | Attending: Emergency Medicine | Admitting: Emergency Medicine

## 2012-04-06 DIAGNOSIS — I129 Hypertensive chronic kidney disease with stage 1 through stage 4 chronic kidney disease, or unspecified chronic kidney disease: Secondary | ICD-10-CM | POA: Insufficient documentation

## 2012-04-06 DIAGNOSIS — J849 Interstitial pulmonary disease, unspecified: Secondary | ICD-10-CM

## 2012-04-06 DIAGNOSIS — Z79899 Other long term (current) drug therapy: Secondary | ICD-10-CM | POA: Insufficient documentation

## 2012-04-06 DIAGNOSIS — R011 Cardiac murmur, unspecified: Secondary | ICD-10-CM | POA: Insufficient documentation

## 2012-04-06 DIAGNOSIS — M545 Low back pain, unspecified: Secondary | ICD-10-CM | POA: Insufficient documentation

## 2012-04-06 DIAGNOSIS — G8929 Other chronic pain: Secondary | ICD-10-CM | POA: Insufficient documentation

## 2012-04-06 DIAGNOSIS — R5381 Other malaise: Secondary | ICD-10-CM | POA: Insufficient documentation

## 2012-04-06 DIAGNOSIS — J209 Acute bronchitis, unspecified: Secondary | ICD-10-CM

## 2012-04-06 DIAGNOSIS — Z7982 Long term (current) use of aspirin: Secondary | ICD-10-CM | POA: Insufficient documentation

## 2012-04-06 DIAGNOSIS — I2510 Atherosclerotic heart disease of native coronary artery without angina pectoris: Secondary | ICD-10-CM | POA: Insufficient documentation

## 2012-04-06 DIAGNOSIS — I509 Heart failure, unspecified: Secondary | ICD-10-CM | POA: Insufficient documentation

## 2012-04-06 DIAGNOSIS — R42 Dizziness and giddiness: Secondary | ICD-10-CM | POA: Insufficient documentation

## 2012-04-06 DIAGNOSIS — E119 Type 2 diabetes mellitus without complications: Secondary | ICD-10-CM | POA: Insufficient documentation

## 2012-04-06 DIAGNOSIS — Z8719 Personal history of other diseases of the digestive system: Secondary | ICD-10-CM | POA: Insufficient documentation

## 2012-04-06 DIAGNOSIS — R059 Cough, unspecified: Secondary | ICD-10-CM | POA: Insufficient documentation

## 2012-04-06 DIAGNOSIS — Z87891 Personal history of nicotine dependence: Secondary | ICD-10-CM | POA: Insufficient documentation

## 2012-04-06 DIAGNOSIS — R5383 Other fatigue: Secondary | ICD-10-CM | POA: Insufficient documentation

## 2012-04-06 DIAGNOSIS — R05 Cough: Secondary | ICD-10-CM | POA: Insufficient documentation

## 2012-04-06 DIAGNOSIS — M129 Arthropathy, unspecified: Secondary | ICD-10-CM | POA: Insufficient documentation

## 2012-04-06 DIAGNOSIS — Z860100 Personal history of colon polyps, unspecified: Secondary | ICD-10-CM | POA: Insufficient documentation

## 2012-04-06 DIAGNOSIS — N183 Chronic kidney disease, stage 3 unspecified: Secondary | ICD-10-CM | POA: Insufficient documentation

## 2012-04-06 DIAGNOSIS — J84115 Respiratory bronchiolitis interstitial lung disease: Secondary | ICD-10-CM | POA: Insufficient documentation

## 2012-04-06 DIAGNOSIS — K219 Gastro-esophageal reflux disease without esophagitis: Secondary | ICD-10-CM | POA: Insufficient documentation

## 2012-04-06 DIAGNOSIS — R1110 Vomiting, unspecified: Secondary | ICD-10-CM | POA: Insufficient documentation

## 2012-04-06 DIAGNOSIS — E785 Hyperlipidemia, unspecified: Secondary | ICD-10-CM | POA: Insufficient documentation

## 2012-04-06 NOTE — ED Notes (Signed)
Patient complaining of shortness of breath over the last couple of weeks; was seen by PCP yesterday and was told that he might have the flu.  Shortness of breath worsened today, especially upon ambulating.  Patient notably short of breath in triage.  Patient denies chest pain.  Reports sore throat, cough, and chest/nasal congestion.

## 2012-04-06 NOTE — ED Provider Notes (Signed)
History     CSN: [id number]  Arrival date & time 04/06/12  2320   First MD Initiated Contact with Patient 04/06/12 2335      Chief Complaint  Patient presents with  . Shortness of Breath    (Consider location/radiation/quality/duration/timing/severity/associated sxs/prior treatment) Patient is a 79 y.o. male presenting with shortness of breath. The history is provided by the patient.  Shortness of Breath  The current episode started more than 1 week ago. Associated symptoms include cough and shortness of breath. Pertinent negatives include no chest pain.   patient has had some shortness of breath and generalized fatigue for the last week and a half. He states it started Christmas. Been seen by his PCP on the 31st and was told that he may touch the flu. He was given Augmentin and cough medicine. He is continued to worsen. Worse with angulated. No chest pain. He has had a mild sore throat. His head has been congested. No fevers. He has a mild chronic cough. No chest pain. He vomited once yesterday. He was just more fatigued today. He is on Imuran. He states his chronic crackles in his lungs. No dysuria. No abdominal pain. No difficulty swallowing.  Past Medical History  Diagnosis Date  . Hyperlipidemia   . Hypertension   . GERD (gastroesophageal reflux disease)   . Arthritis     osteoarthritis, s/p R TKR, and digits  . Overweight (BMI 25.0-29.9)     BMI 29  . Interstitial lung disease     NOS  . Pulmonary infiltrates 12-09    stable, 7/10: ? due to GERD  . Diverticulosis   . Hiatal hernia   . Esophageal stricture     s/p dilation spring 2010  . CAD (coronary artery disease)     symptoms followed lopressor Jan-Jul 09 and lisinopril increase July 09, 1/09 Myoview- non ischemic with an EF of 51% and inferobasal wall scar, 10/09 Cath- severe native three-vessel coronary artery disease, s/p multivessel coronary bypass graft with all grafts, Normal LV sys fn with normal LV filling  pressures; 10/04-Adenosine cardiolite- thinning wo ischemia, EF 55%; 1/08 Cardiolite neg  . History of PFTs     mixed pattern on spiro. mild restn on lung volumes with near normal DLCO. Pattern can be explained by CABG scar. Fev1 2.2L/73%, ratio 68 (67), TLC 4.7/68%,RV 1.5L/55%,DLCO 79%  . Colon polyps   . Chronic kidney disease, stage III (moderate)   . Chronic diastolic CHF (congestive heart failure)     a) 09/13 ECHO- LVEF 99991111, grade 1 diastolic dysfunction, mild LA dilatation, atrial septal aneurysm, AV mobility restricted, but no sig AS by doppler; b) 09/04/08 ECHO- LVH, ef 60%, mild AS,  . Heart murmur   . Anginal pain   . Dyspnea 2009 since July -Sept    05/06/08-CPST-  normal effort, reduced VO2 max 20.5 /65%, reduced at 8.2/ 40%, normal breathing resetvca of 55%, submaximal heart rate response 112/77%, flattened o2 pluse response at peak exercise-12 ml/beat @ 85%, No VQ mismatch abnormalities, All c/w CIRC Limitation  . Type II diabetes mellitus   . Chronic lower back pain     Past Surgical History  Procedure Date  . Total knee arthroplasty 03/2010    Right- [name]  . Cholecystectomy 11/2003    [name]- open  . Knee arthroscopy 2008    right  . Cataract extraction w/ intraocular lens  implant, bilateral   . Coronary artery bypass graft 11/1999    CABG X5  .  Cardiac catheterization 5/06    CP- no MI, Cath- small vessell disease  . Cardiac catheterization 12/31/2011    80% distal LM, 100% native LAD, LCx and RCA, 30% prox SVG-OM, SVG-D1 normal, 99% distal, 80% ostial SVG-RCA distal to graft, LIMA-LAD normal; LVEF mildly decreased with posterior basal AK  . Ptca 01/03/2012    Successful DES to SVG-RCA and cutting balloon angioplasty ostial  PDA    Family History  Problem Relation Age of Onset  . COPD Mother   . Heart disease Father   . Diabetes Brother   . Colon cancer Brother   . Alcohol abuse Sister     History  Substance Use Topics  . Smoking status: Former Smoker  -- 1.0 packs/day for 20 years    Types: Cigarettes    Quit date: 04/06/1963  . Smokeless tobacco: Never Used  . Alcohol Use: Yes     Comment: 01/01/2012 "last alcohol ~ 50 yr ago"      Review of Systems  Constitutional: Positive for fatigue. Negative for activity change and appetite change.  HENT: Negative for neck stiffness.   Eyes: Negative for pain.  Respiratory: Positive for cough and shortness of breath. Negative for chest tightness.   Cardiovascular: Negative for chest pain and leg swelling.  Gastrointestinal: Positive for vomiting. Negative for nausea, abdominal pain and diarrhea.  Genitourinary: Negative for flank pain.  Musculoskeletal: Negative for back pain.  Skin: Negative for rash.  Neurological: Positive for light-headedness. Negative for weakness, numbness and headaches.  Psychiatric/Behavioral: Negative for behavioral problems.    Allergies  Doxazosin mesylate and Methocarbamol  Home Medications   Current Outpatient Rx  Name  Route  Sig  Dispense  Refill  . AMLODIPINE BESYLATE 10 MG PO TABS      TAKE 1 TABLET (10 MG TOTAL) BY MOUTH DAILY.   30 tablet   10   . AMOXICILLIN-POT CLAVULANATE 875-125 MG PO TABS   Oral   Take 1 tablet by mouth 2 (two) times daily.   20 tablet   0   . ASPIRIN 81 MG PO TABS   Oral   Take 81 mg by mouth daily.           . AZATHIOPRINE 50 MG PO TABS   Oral   Take 50 mg by mouth 2 (two) times daily.         [name] CARVEDILOL 12.5 MG PO TABS   Oral   Take 12.5 mg by mouth 2 (two) times daily with a meal.         . FUROSEMIDE 20 MG PO TABS   Oral   Take 20 mg by mouth as needed.          [name] GABAPENTIN 300 MG PO CAPS      TAKE 3 CAPSULES BY MOUTH TWICE A DAY   180 capsule   5   . HYDROCODONE-HOMATROPINE 5-1.5 MG/5ML PO SYRP   Oral   Take 5 mLs by mouth at bedtime as needed for cough.   120 mL   0   . LOSARTAN POTASSIUM 100 MG PO TABS      TAKE 1 TABLET (100 MG TOTAL) BY MOUTH DAILY.   30 tablet   10   .  METFORMIN HCL 1000 MG PO TABS   Oral   Take 1 tablet (1,000 mg total) by mouth 2 (two) times daily with a meal.   60 tablet   11   . ICAPS PO CAPS   Oral  Take by mouth daily.          [name] NITROGLYCERIN 0.4 MG SL SUBL   Sublingual   Place 1 tablet (0.4 mg total) under the tongue every 5 (five) minutes as needed. Chest pain   25 tablet   4   . NORTRIPTYLINE HCL 25 MG PO CAPS   Oral   Take 1-2 capsules (25-50 mg total) by mouth at bedtime.   60 capsule   1   . OMEPRAZOLE MAGNESIUM 20 MG PO TBEC   Oral   Take 20 mg by mouth daily.           [name] PRAVASTATIN SODIUM 40 MG PO TABS   Oral   Take 1 tablet (40 mg total) by mouth daily.   30 tablet   11   . TICAGRELOR 90 MG PO TABS   Oral   Take 1 tablet (90 mg total) by mouth 2 (two) times daily.   60 tablet   3   . VITAMIN B-12 1000 MCG PO TABS   Oral   Take 1,000 mcg by mouth daily.           BP 142/99  Pulse 74  Temp 97.8 F (36.6 C) (Oral)  Resp 20  SpO2 99%  Physical Exam  Nursing note and vitals reviewed. Constitutional: He is oriented to person, place, and time. He appears well-developed and well-nourished.  HENT:  Head: Normocephalic and atraumatic.  Eyes: EOM are normal. Pupils are equal, round, and reactive to light.  Neck: Normal range of motion. Neck supple.  Cardiovascular: Normal rate, regular rhythm and normal heart sounds.   No murmur heard. Pulmonary/Chest: Effort normal. He has rales.       Rales bilateral bases.   Abdominal: Soft. Bowel sounds are normal. He exhibits no distension and no mass. There is no tenderness. There is no rebound and no guarding.  Musculoskeletal: Normal range of motion. He exhibits no edema.  Neurological: He is alert and oriented to person, place, and time. No cranial nerve deficit.  Skin: Skin is warm and dry.  Psychiatric: He has a normal mood and affect.    ED Course  Procedures (including critical care time)  Labs Reviewed  CBC WITH DIFFERENTIAL -  Abnormal; Notable for the following:    WBC 10.6 (*)     Hemoglobin 11.9 (*)     HCT 36.4 (*)     All other components within normal limits  COMPREHENSIVE METABOLIC PANEL - Abnormal; Notable for the following:    Glucose, Bld 162 (*)     Total Protein 8.7 (*)     GFR calc non Af Amer 51 (*)     GFR calc Af Amer 60 (*)     All other components within normal limits  PRO B NATRIURETIC PEPTIDE  TROPONIN I  POCT I-STAT TROPONIN I   Dg Chest 2 View  04/07/2012  *RADIOLOGY REPORT*  Clinical Data: Fever with cough and shortness of breath for 1 week.  CHEST - 2 VIEW  Comparison: 12/31/2011.  Findings: The heart size and mediastinal contours are stable status post CABG.  There is stable chronic lung disease with central airway thickening and scattered scarring.  No edema, confluent airspace opacity or pleural effusion is seen.  The osseous structures appear stable.  IMPRESSION: Stable postoperative chest.  No acute cardiopulmonary process.   Original Report Authenticated By: Richardean Sale, M.D.      1. Acute bronchitis   2. ILD (interstitial lung disease)  Date: 04/07/2012  Rate: 76  Rhythm: normal sinus rhythm and premature ventricular contractions (PVC)  QRS Axis: normal  Intervals: normal  ST/T Wave abnormalities: nonspecific ST/T changes  Conduction Disutrbances:none  Narrative Interpretation: nons-pecific ST?T wave changes  Old EKG Reviewed: changes noted    MDM  Patient with URI type symptoms with worsening shortness of breath worsening with angulation. He is not hypoxic on room air. He was given a breathing treatment it feels when better. X-ray is chronic without new infiltrates. He has chronic lung disease. No fevers. EKG is overall reassuring. He does not appear to be in CHF at this time. He is eager to be discharged and will be discharged home follow up with Dr. Buddy Duty on antibiotics by his primary care Dr.        [name] Riling. Alvino Chapel, MD 04/07/12 [id number]

## 2012-04-07 LAB — CBC WITH DIFFERENTIAL/PLATELET
Basophils Absolute: 0.1 10*3/uL (ref 0.0–0.1)
Eosinophils Absolute: 0.4 10*3/uL (ref 0.0–0.7)
Lymphocytes Relative: 21 % (ref 12–46)
MCHC: 32.7 g/dL (ref 30.0–36.0)
Monocytes Relative: 8 % (ref 3–12)
Platelets: 336 10*3/uL (ref 150–400)
RDW: 14.5 % (ref 11.5–15.5)
WBC: 10.6 10*3/uL — ABNORMAL HIGH (ref 4.0–10.5)

## 2012-04-07 LAB — COMPREHENSIVE METABOLIC PANEL
AST: 24 U/L (ref 0–37)
Albumin: 3.7 g/dL (ref 3.5–5.2)
Alkaline Phosphatase: 73 U/L (ref 39–117)
Chloride: 99 mEq/L (ref 96–112)
Creatinine, Ser: 1.29 mg/dL (ref 0.50–1.35)
Potassium: 3.8 mEq/L (ref 3.5–5.1)
Total Bilirubin: 0.6 mg/dL (ref 0.3–1.2)
Total Protein: 8.7 g/dL — ABNORMAL HIGH (ref 6.0–8.3)

## 2012-04-07 LAB — POCT I-STAT TROPONIN I

## 2012-04-07 LAB — PRO B NATRIURETIC PEPTIDE: Pro B Natriuretic peptide (BNP): 443.3 pg/mL (ref 0–450)

## 2012-04-07 LAB — TROPONIN I: Troponin I: <0.3 ng/mL (ref <0.30)

## 2012-04-07 MED ORDER — ALBUTEROL SULFATE (5 MG/ML) 0.5% IN NEBU
5.0000 mg | INHALATION_SOLUTION | Freq: Once | RESPIRATORY_TRACT | Status: AC
  Administered 2012-04-07: 5 mg via RESPIRATORY_TRACT
  Filled 2012-04-07: qty 1

## 2012-04-07 MED ORDER — ALBUTEROL SULFATE HFA 108 (90 BASE) MCG/ACT IN AERS
2.0000 | INHALATION_SPRAY | RESPIRATORY_TRACT | Status: DC | PRN
  Administered 2012-04-07: 2 via RESPIRATORY_TRACT
  Filled 2012-04-07: qty 6.7

## 2012-04-19 ENCOUNTER — Other Ambulatory Visit: Payer: Self-pay | Admitting: Internal Medicine

## 2012-04-27 ENCOUNTER — Ambulatory Visit (INDEPENDENT_AMBULATORY_CARE_PROVIDER_SITE_OTHER): Payer: Medicare Other | Admitting: Internal Medicine

## 2012-04-27 ENCOUNTER — Encounter: Payer: Self-pay | Admitting: Internal Medicine

## 2012-04-27 VITALS — BP 108/72 | HR 73 | Temp 98.3°F | Ht 73.0 in | Wt 216.8 lb

## 2012-04-27 DIAGNOSIS — R0600 Dyspnea, unspecified: Secondary | ICD-10-CM

## 2012-04-27 DIAGNOSIS — J849 Interstitial pulmonary disease, unspecified: Secondary | ICD-10-CM

## 2012-04-27 DIAGNOSIS — R0609 Other forms of dyspnea: Secondary | ICD-10-CM

## 2012-04-27 DIAGNOSIS — J8410 Pulmonary fibrosis, unspecified: Secondary | ICD-10-CM

## 2012-04-27 NOTE — Assessment & Plan Note (Signed)
Dyspnea is unimproved despite diagnosis of rheumatoid arthritis interstitial lung disease and starting treatment with Imuran. He is also had coronary intervention in the interim. This also does not help his dyspnea. He is deconditioned on he has progressive pulmonary fibrosis. Not her to separate the 2 I will get pulmonary function tests. If this shows worsening then I think we should consider CellCept noting that CellCept will only prevent progression of the disease. In order to obtain improvement from dyspnea I think he needs pulmonary rehabilitation which she has refused for many years ago due to cost. However this time he is willing to go and I made another referral again. We'll also try him on empiric inhaled corticosteroids along with long-acting beta agonist because he said that albuterol given in the emergency room helped him. Followup depends on results of the pulmonary function test  (> 50% of this 15 min visit spent in face to face counseling)

## 2012-04-27 NOTE — Progress Notes (Signed)
Subjective:    Patient ID: [name], male    DOB: 05/04/1931, 79 y.o.   MRN: [id number]  HPI #[id number] - since July-Sept 2009  - Coronary artery disease. s/p CABG x 5 in 2002.  Dyspnea started following lopressor Jan-July 2009 and lisinopril increae July 2009. No relief despite stopping agents July 2010  -   Myoview on April 17, 2007 was  nonischemic with an EF of 51% and inferobasal wall scar.   - CAth Oct 2009:    - . Severe native three-vessel coronary artery disease. 2. Status post multivessel coronary bypass surgery with all grafts  patent. Normal LVEF and LVEDP - PFT - Mixed pattern on spiro. Mild restn on lung volumeswith near normal DLCO.   - Fev1 2.2L/73%, FVC 3.21L/70%, Ratio 68 (67),  TLC 4.7L/68%, RV 1.5L/55%, DLCO 79%.  -CPST 05/06/2008: Normal effot.    - Reduced VO2 max 20.5/65%, REduced AT 8.2/40%, Normal breathing reservce of 55%, submaximal heart rate response 112/77%,  flattened O2 pulse response at peak exercise - 63ml/beat at 85%. No VQ mismatch abnormalities. - ECHO 09/04/2008: LVH, ef 60%, mild AS and unchanged from prior   - CT chest   - Non specific Interstitial Lung disease NOS. Stable pulm infilratates RLL ggo > LLL ggo. 03/2008 -> 10/2008 -> June 2011: stable - Rehab: never attended 2009-2013 due to cost   OV 10/05/2011 Last seen September 22, 2009 and discharged from followup. He never attended rehab due to cost issues. Now reports that on 09/05/11 noticed abrupt dyspnea worsened from class 1 to 3. Also, edema. No chest pain, hemoptysis. So, 09/09/11 went to ER. Trop, BNP normal. No d-didimer. CXR reported as stable but am wondering if there is increased ILD. Given lasix and now edema better and dyspnea better and > 75% towads baseline  Walking desat test : resting 98/94% -> 3 laps x 185 feet; HR 113/pulse ox 96%   Past, Family, Social reviewed: no change since last visit in 2 year. No major changes to health in 2 years  REC Do not know why exactly you are more short  of breath The main difference I think we notice compared to 2 years ago is the slightly more crackling noise in lung but this is not showing up on walking test or xray Best you have breathing test called PFT Return to see me or NP or DR Halford Chessman  for results of PFt in next 4 weeks   OV 11/22/2011 Followup test results: Dyspnea is unchanged since last. IN review of tests: PFTs July 2013 show worsening restriction since 2010 along with reduced diffusion. The RLL baseline GGO might be worse in July 2013 compared to 2011. In addition, autommune profile shows VERY HIGH TITERS of CCP antibody > 300 which is pathognomonic of rheumtoid arthritis lung involvement. RF is only borderline elevated a 23   PFT 10/12/11>>FEV1 2.02 (70%), FEV1% 72, TLC 4.07 (59%), DLCO 62%, no BD: RESTRICTION - WORSE since 2010 -(2010:  Fev1 2.2L/73%, FVC 3.21L/70%, Ratio 68 (67),  TLC 4.7L/68%, RV 1.5L/55%, DLCO 79%)   Echo 05/04/11>>mild LVH, EF 55 to 123456, grade 1 diastolic dysfx, mild AS, mild MR, PAS 34 mmHg  CT 11/02/11 - calcification suspicious for aortic valve and some GGO esp RLL > LLL (? RLL worse compared to 2011)   Past, Family, Social reviewed: new problem is that  rT flank and RUQ pain. No associated weight loss but has gained. Come and goes. Mild pain. Unclear what aggravating factors  and relieving factors. Annoying quality.    Have blood work today related to lung disease  I will talk to Dr Johnsie Cancel  I will call you back after results are in and after talking to Dr Johnsie Cancel  ...  Update - see phone note 11/26/11. I will send it to Dr Johnsie Cancel   Telephone call 8/22/013 hutoimmune test results: RF and CCP antibodies positive. Esp CCP strongly positive. 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 Dyspnea is from this +/- joint pain that he might be having. He needs immune     Cardiology office note November 2013  He was admitted 9/27-10/1 with chest pain. Myocardial infarction was ruled out. Cardiac  catheterization demonstrated high-grade disease in the SVG-RCA as well as ostial PDA distal to the graft. He had some complications from his diagnostic cath with groin hematoma. He was brought back to the Cath Lab several days later for PCI with DES to the [facility] and cutting PTCA to the [facility]. Dual antiplatelet therapy recommended for one year.  LHC 12/31/11: dLM 80%, LAD 100%, circumflex 100%, RCA 100%, proximal SVG-OM 30%, SVG-D1 ok, SVG-RCA 99% distal and 80% ostial PDA distal to graft, LIMA-LAD ok with some left to right collaterals.  PCI 01/03/12: Promus DES to the SVG-RCA and Cutting Balloon angioplasty to the ostial PDA.  Echocardiogram 12/31/11: EF 99991111, grade 1 diastolic dysfunction, mild aortic stenosis, mean gradient 9, mild LAE.  Labs (10/13): K 4.5, creatinine 1.29, ALT 14, LDL 69, Hgb 12.5  Since d/c, he is doing well. No chest pain. Dyspnea improved. Probably Class IIb. No syncope. No orthopnea, PND, edema   OV 04/27/2012  Mr. Bluemel returns for followup after 6 months. This is for dyspnea out of proportion to his interstitial lung disease. His interstitial lung disease is on the basis of rheumatoid arthritis. Since I last saw him he has seen rheumatologist  Dr [name], who has started him on Imuran and prednisone. He does be that he took the prednisone only for a short time but he continues on Imuran right now. He feels that this intervention only helped his arthritic symptoms which came to light after the diagnosis of serum antibody positivity rheumatoid arthritis. However it does not help his dyspnea. Then in September 2013 J. chest pain and had cardiac catheterization and status post PCI. He followed up with cardiology in November 2013 and was noted to have improved dyspnea but he tells me now that his dyspnea never really improved. Currently he is dyspneic for simple things like tying his shoelaces, bending over. It is also dyspneic when he is sitting and doing nothing and he  feels like he is to take a big deep breath. Is also dyspneic with exertion and relieved with rest. Severity is moderate. Symptoms are unimproved but stable. There is no associated cough. Of note he went to the emergency room early in January 2014 and was given albuterol as needed which he says helps his symptoms. Labs on 04/07/2047 normal. He is really frustrated by his dyspnea.  So far we have never been able to improve his dyspnea. His rheumatologist is concerned about worsening interstitial lung disease  Of note in the past we've had several discussions about attending pulmonary rehabilitation but due to cost factors he is refused  I screened him about depression and anxiety and he denies this.  Past, Family, Social reviewed: no change since last visit other than noted as above   ShailiReview of Systems  Constitutional: Negative for  fever and unexpected weight change.  HENT: Negative for ear pain, nosebleeds, congestion, sore throat, rhinorrhea, sneezing, trouble swallowing, dental problem, postnasal drip and sinus pressure.   Eyes: Negative for redness and itching.  Respiratory: Positive for shortness of breath. Negative for cough, chest tightness and wheezing.   Cardiovascular: Negative for palpitations and leg swelling.  Gastrointestinal: Negative for nausea and vomiting.  Genitourinary: Negative for dysuria.  Musculoskeletal: Negative for joint swelling.  Skin: Negative for rash.  Neurological: Negative for headaches.  Hematological: Does not bruise/bleed easily.  Psychiatric/Behavioral: Negative for dysphoric mood. The patient is not nervous/anxious.    Current outpatient prescriptions:amLODipine (NORVASC) 10 MG tablet, TAKE 1 TABLET (10 MG TOTAL) BY MOUTH DAILY., Disp: 30 tablet, Rfl: 10;  aspirin 81 MG tablet, Take 81 mg by mouth daily.  , Disp: , Rfl: ;  azaTHIOprine (IMURAN) 50 MG tablet, Take 50 mg by mouth 2 (two) times daily., Disp: , Rfl: ;  carvedilol (COREG) 12.5 MG tablet,  Take 12.5 mg by mouth 2 (two) times daily with a meal., Disp: , Rfl:  furosemide (LASIX) 20 MG tablet, Take 20 mg by mouth as needed. , Disp: , Rfl: ;  gabapentin (NEURONTIN) 300 MG capsule, TAKE 3 CAPSULES BY MOUTH TWICE A DAY, Disp: 180 capsule, Rfl: 5;  HYDROcodone-homatropine (HYCODAN) 5-1.5 MG/5ML syrup, Take 5 mLs by mouth at bedtime as needed for cough., Disp: 120 mL, Rfl: 0;  losartan (COZAAR) 100 MG tablet, TAKE 1 TABLET (100 MG TOTAL) BY MOUTH DAILY., Disp: 30 tablet, Rfl: 10 metFORMIN (GLUCOPHAGE) 1000 MG tablet, Take 1 tablet (1,000 mg total) by mouth 2 (two) times daily with a meal., Disp: 60 tablet, Rfl: 11;  Multiple Vitamins-Minerals (ICAPS) CAPS, Take by mouth daily. , Disp: , Rfl: ;  nitroGLYCERIN (NITROSTAT) 0.4 MG SL tablet, Place 1 tablet (0.4 mg total) under the tongue every 5 (five) minutes as needed. Chest pain, Disp: 25 tablet, Rfl: 4 nortriptyline (PAMELOR) 25 MG capsule, Take 1-2 capsules (25-50 mg total) by mouth at bedtime., Disp: 60 capsule, Rfl: 1;  omeprazole (PRILOSEC OTC) 20 MG tablet, Take 20 mg by mouth daily.  , Disp: , Rfl: ;  pravastatin (PRAVACHOL) 40 MG tablet, Take 1 tablet (40 mg total) by mouth daily., Disp: 30 tablet, Rfl: 11;  Ticagrelor (BRILINTA) 90 MG TABS tablet, Take 1 tablet (90 mg total) by mouth 2 (two) times daily., Disp: 60 tablet, Rfl: 3 vitamin B-12 (CYANOCOBALAMIN) 1000 MCG tablet, Take 1,000 mcg by mouth daily., Disp: , Rfl:      Objective:   Physical Exam General:  normal appearance and obese.  Same as 2 years ago Head:  Normocephalic and atraumatic. Eyes:  PERRLA, no icterus. Nose:  Narrow nasal angles Mouth:  MP3, 2+ tonsills, poor dentition, retrognathic Neck:  no JVD, no thyromegaly Chest Wall:  Symmetrical,  no deformities . Lungs:  clear bilaterally to auscultation and percussion: CRACKLES + BOTTOM 1/3rd -  Heart:  regular rhythm and normal rate.   Cardiac: Grade 2 murmur in aortic area + Abdomen:  Soft, nontender and  nondistended. Msk:  Symmetrical with no gross deformities. Normal posture. Pulses:  Normal pulses noted.       Assessment & Plan:

## 2012-04-27 NOTE — Patient Instructions (Addendum)
I understand it is frustrating that they've not been able to improve your shortness of breath despite several interventions and new diagnoses Please repeat pulmonary function testing and if it shows the pulmonary fibrosis is getting worse I will talk to her rheumatologist about changing some medications  - However this is only meant to prevent further worsening and will not improve things from where they are In order to improve things from where they are you absolutely need pulmonary rehabilitation and I made a referral to them. We will work with them about reducing cost   also start empiric  dulera samples; he will opick this up when he comes for PFT Followup based on our phone conversation of pulmonary function test results

## 2012-04-28 ENCOUNTER — Ambulatory Visit (INDEPENDENT_AMBULATORY_CARE_PROVIDER_SITE_OTHER): Payer: Medicare Other | Admitting: Internal Medicine

## 2012-04-28 DIAGNOSIS — J8410 Pulmonary fibrosis, unspecified: Secondary | ICD-10-CM

## 2012-04-28 DIAGNOSIS — J849 Interstitial pulmonary disease, unspecified: Secondary | ICD-10-CM

## 2012-04-28 LAB — PULMONARY FUNCTION TEST

## 2012-04-28 NOTE — Progress Notes (Signed)
PFT done today. 

## 2012-05-03 ENCOUNTER — Emergency Department (HOSPITAL_COMMUNITY)
Admission: EM | Admit: 2012-05-03 | Discharge: 2012-05-04 | Disposition: A | Discharge status: Home / Self Care | Payer: Medicare Other | Attending: Emergency Medicine | Admitting: Emergency Medicine

## 2012-05-03 ENCOUNTER — Other Ambulatory Visit (HOSPITAL_COMMUNITY): Payer: Self-pay | Admitting: Physician Assistant

## 2012-05-03 DIAGNOSIS — J069 Acute upper respiratory infection, unspecified: Secondary | ICD-10-CM | POA: Insufficient documentation

## 2012-05-03 DIAGNOSIS — Z860100 Personal history of colon polyps, unspecified: Secondary | ICD-10-CM | POA: Insufficient documentation

## 2012-05-03 DIAGNOSIS — Z8739 Personal history of other diseases of the musculoskeletal system and connective tissue: Secondary | ICD-10-CM | POA: Insufficient documentation

## 2012-05-03 DIAGNOSIS — G8929 Other chronic pain: Secondary | ICD-10-CM | POA: Insufficient documentation

## 2012-05-03 DIAGNOSIS — I5032 Chronic diastolic (congestive) heart failure: Secondary | ICD-10-CM | POA: Insufficient documentation

## 2012-05-03 DIAGNOSIS — Z951 Presence of aortocoronary bypass graft: Secondary | ICD-10-CM | POA: Insufficient documentation

## 2012-05-03 DIAGNOSIS — M545 Low back pain, unspecified: Secondary | ICD-10-CM | POA: Insufficient documentation

## 2012-05-03 DIAGNOSIS — R011 Cardiac murmur, unspecified: Secondary | ICD-10-CM | POA: Insufficient documentation

## 2012-05-03 DIAGNOSIS — N183 Chronic kidney disease, stage 3 unspecified: Secondary | ICD-10-CM | POA: Insufficient documentation

## 2012-05-03 DIAGNOSIS — Z79899 Other long term (current) drug therapy: Secondary | ICD-10-CM | POA: Insufficient documentation

## 2012-05-03 DIAGNOSIS — E119 Type 2 diabetes mellitus without complications: Secondary | ICD-10-CM | POA: Insufficient documentation

## 2012-05-03 DIAGNOSIS — R059 Cough, unspecified: Secondary | ICD-10-CM | POA: Insufficient documentation

## 2012-05-03 DIAGNOSIS — Z87891 Personal history of nicotine dependence: Secondary | ICD-10-CM | POA: Insufficient documentation

## 2012-05-03 DIAGNOSIS — R05 Cough: Secondary | ICD-10-CM | POA: Insufficient documentation

## 2012-05-03 DIAGNOSIS — R0989 Other specified symptoms and signs involving the circulatory and respiratory systems: Secondary | ICD-10-CM | POA: Insufficient documentation

## 2012-05-03 DIAGNOSIS — I2510 Atherosclerotic heart disease of native coronary artery without angina pectoris: Secondary | ICD-10-CM | POA: Insufficient documentation

## 2012-05-03 DIAGNOSIS — E785 Hyperlipidemia, unspecified: Secondary | ICD-10-CM | POA: Insufficient documentation

## 2012-05-03 DIAGNOSIS — R0609 Other forms of dyspnea: Secondary | ICD-10-CM | POA: Insufficient documentation

## 2012-05-03 DIAGNOSIS — I129 Hypertensive chronic kidney disease with stage 1 through stage 4 chronic kidney disease, or unspecified chronic kidney disease: Secondary | ICD-10-CM | POA: Insufficient documentation

## 2012-05-03 DIAGNOSIS — K219 Gastro-esophageal reflux disease without esophagitis: Secondary | ICD-10-CM | POA: Insufficient documentation

## 2012-05-03 DIAGNOSIS — J8410 Pulmonary fibrosis, unspecified: Secondary | ICD-10-CM | POA: Insufficient documentation

## 2012-05-03 DIAGNOSIS — Z7982 Long term (current) use of aspirin: Secondary | ICD-10-CM | POA: Insufficient documentation

## 2012-05-03 DIAGNOSIS — Z8709 Personal history of other diseases of the respiratory system: Secondary | ICD-10-CM | POA: Insufficient documentation

## 2012-05-03 DIAGNOSIS — R110 Nausea: Secondary | ICD-10-CM | POA: Insufficient documentation

## 2012-05-03 DIAGNOSIS — M069 Rheumatoid arthritis, unspecified: Secondary | ICD-10-CM | POA: Insufficient documentation

## 2012-05-03 DIAGNOSIS — Z8719 Personal history of other diseases of the digestive system: Secondary | ICD-10-CM | POA: Insufficient documentation

## 2012-05-03 NOTE — ED Notes (Signed)
MD at bedside. 

## 2012-05-03 NOTE — ED Provider Notes (Signed)
History     CSN: [id number]  Arrival date & time 05/03/12  1843   First MD Initiated Contact with Patient 05/03/12 2325      Chief Complaint  Patient presents with  . Shortness of Breath    (Consider location/radiation/quality/duration/timing/severity/associated sxs/prior treatment) HPI Comments: Pt with hx of interstitial lung disease who presents with worsening SOB X 2 days - slight increased cough, increased dyspnea and having to take deeper breaths.  According to recent office visit note the patient was seen in the last week by the pulmonologist and told that his interstitial lung disease was worsening, that he had increased restriction on his lungs and that his clinical exam showed increased rales. 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 He has been visiting with a patient in the hospital who had a stroke and last night developed nausea and increased shortness of breath. He has not been having chest pain back pain swelling of the legs rashes or diarrhea and he has been able to tolerate some chicken noodle soup this evening. The symptoms are persistent, nothing seems to make it better or worse, it is associated with subjective fevers per his bowels though the patient does not complain of this himself.  Patient is a 79 y.o. male presenting with shortness of breath. The history is provided by the patient, the spouse and medical records.  Shortness of Breath  Associated symptoms include shortness of breath.    Past Medical History  Diagnosis Date  . Hyperlipidemia   . Hypertension   . GERD (gastroesophageal reflux disease)   . Arthritis     osteoarthritis, s/p R TKR, and digits  . Overweight (BMI 25.0-29.9)     BMI 29  . Interstitial lung disease     NOS  . Pulmonary infiltrates 12-09    stable, 7/10: ? due to GERD  . Diverticulosis   . Hiatal hernia   . Esophageal stricture     s/p dilation spring  2010  . CAD (coronary artery disease)     symptoms followed lopressor Jan-Jul 09 and lisinopril increase July 09, 1/09 Myoview- non ischemic with an EF of 51% and inferobasal wall scar, 10/09 Cath- severe native three-vessel coronary artery disease, s/p multivessel coronary bypass graft with all grafts, Normal LV sys fn with normal LV filling pressures; 10/04-Adenosine cardiolite- thinning wo ischemia, EF 55%; 1/08 Cardiolite neg  . History of PFTs     mixed pattern on spiro. mild restn on lung volumes with near normal DLCO. Pattern can be explained by CABG scar. Fev1 2.2L/73%, ratio 68 (67), TLC 4.7/68%,RV 1.5L/55%,DLCO 79%  . Colon polyps   . Chronic kidney disease, stage III (moderate)   . Chronic diastolic CHF (congestive heart failure)     a) 09/13 ECHO- LVEF 99991111, grade 1 diastolic dysfunction, mild LA dilatation, atrial septal aneurysm, AV mobility restricted, but no sig AS by doppler; b) 09/04/08 ECHO- LVH, ef 60%, mild AS,  . Heart murmur   . Anginal pain   . Dyspnea 2009 since July -Sept    05/06/08-CPST-  normal effort, reduced VO2 max 20.5 /65%, reduced at 8.2/ 40%, normal breathing resetvca of 55%, submaximal heart rate response 112/77%, flattened o2 pluse response at peak exercise-12 ml/beat @ 85%, No VQ mismatch abnormalities, All c/w CIRC Limitation  . Type II diabetes mellitus   . Chronic lower back pain     Past Surgical History  Procedure Date  .  Total knee arthroplasty 03/2010    Right- [name]  . Cholecystectomy 11/2003    [name]- open  . Knee arthroscopy 2008    right  . Cataract extraction w/ intraocular lens  implant, bilateral   . Coronary artery bypass graft 11/1999    CABG X5  . Cardiac catheterization 5/06    CP- no MI, Cath- small vessell disease  . Cardiac catheterization 12/31/2011    80% distal LM, 100% native LAD, LCx and RCA, 30% prox SVG-OM, SVG-D1 normal, 99% distal, 80% ostial SVG-RCA distal to graft, LIMA-LAD normal; LVEF mildly decreased with  posterior basal AK  . Ptca 01/03/2012    Successful DES to SVG-RCA and cutting balloon angioplasty ostial  PDA    Family History  Problem Relation Age of Onset  . COPD Mother   . Heart disease Father   . Diabetes Brother   . Colon cancer Brother   . Alcohol abuse Sister     History  Substance Use Topics  . Smoking status: Former Smoker -- 1.0 packs/day for 20 years    Types: Cigarettes    Quit date: 04/06/1963  . Smokeless tobacco: Never Used  . Alcohol Use: Yes     Comment: 01/01/2012 "last alcohol ~ 50 yr ago"      Review of Systems  Respiratory: Positive for shortness of breath.   All other systems reviewed and are negative.    Allergies  Doxazosin mesylate and Methocarbamol  Home Medications   Current Outpatient Rx  Name  Route  Sig  Dispense  Refill  . AMLODIPINE BESYLATE 10 MG PO TABS      TAKE 1 TABLET (10 MG TOTAL) BY MOUTH DAILY.   30 tablet   10   . AMOXICILLIN-POT CLAVULANATE 875-125 MG PO TABS               . ASPIRIN 81 MG PO TABS   Oral   Take 81 mg by mouth daily.           . AZATHIOPRINE 50 MG PO TABS   Oral   Take 50 mg by mouth 2 (two) times daily.         [name] CARVEDILOL 12.5 MG PO TABS   Oral   Take 12.5 mg by mouth 2 (two) times daily with a meal.         . GABAPENTIN 300 MG PO CAPS      TAKE 3 CAPSULES BY MOUTH TWICE A DAY   180 capsule   5   . LOSARTAN POTASSIUM 100 MG PO TABS      TAKE 1 TABLET (100 MG TOTAL) BY MOUTH DAILY.   30 tablet   10   . METFORMIN HCL 1000 MG PO TABS   Oral   Take 1 tablet (1,000 mg total) by mouth 2 (two) times daily with a meal.   60 tablet   11   . MOMETASONE FURO-FORMOTEROL FUM 100-5 MCG/ACT IN AERO   Inhalation   Inhale 1 puff into the lungs 2 (two) times daily as needed. For shortness of breath         . ICAPS PO CAPS   Oral   Take by mouth daily.          [name] NORTRIPTYLINE HCL 25 MG PO CAPS   Oral   Take 1-2 capsules (25-50 mg total) by mouth at bedtime.   60  capsule   1   . OMEPRAZOLE MAGNESIUM 20 MG PO TBEC  Oral   Take 20 mg by mouth daily.           [name] PRAVASTATIN SODIUM 40 MG PO TABS   Oral   Take 1 tablet (40 mg total) by mouth daily.   30 tablet   11   . TICAGRELOR 90 MG PO TABS   Oral   Take 1 tablet (90 mg total) by mouth 2 (two) times daily.   60 tablet   3   . VITAMIN B-12 1000 MCG PO TABS   Oral   Take 1,000 mcg by mouth daily.         . FUROSEMIDE 20 MG PO TABS   Oral   Take 20 mg by mouth daily as needed. For edema         . HYDROCODONE-HOMATROPINE 5-1.5 MG/5ML PO SYRP   Oral   Take 5 mLs by mouth at bedtime as needed for cough.   120 mL   0   . LEVOFLOXACIN 500 MG PO TABS   Oral   Take 1 tablet (500 mg total) by mouth daily.   7 tablet   0   . NITROGLYCERIN 0.4 MG SL SUBL   Sublingual   Place 1 tablet (0.4 mg total) under the tongue every 5 (five) minutes as needed. Chest pain   25 tablet   4   . OSELTAMIVIR PHOSPHATE 75 MG PO CAPS   Oral   Take 1 capsule (75 mg total) by mouth every 12 (twelve) hours.   10 capsule   0     BP 161/70  Pulse 79  Temp 97.9 F (36.6 C) (Oral)  Resp 20  SpO2 95%  Physical Exam  Nursing note and vitals reviewed. Constitutional: He appears well-developed and well-nourished. No distress.  HENT:  Head: Normocephalic and atraumatic.  Mouth/Throat: Oropharynx is clear and moist. No oropharyngeal exudate.       Moist mucous membranes  Eyes: Conjunctivae normal and EOM are normal. Pupils are equal, round, and reactive to light. Right eye exhibits no discharge. Left eye exhibits no discharge. No scleral icterus.  Neck: Normal range of motion. Neck supple. No JVD present. No thyromegaly present.  Cardiovascular: Normal rate, regular rhythm, normal heart sounds and intact distal pulses.  Exam reveals no gallop and no friction rub.   No murmur heard. Pulmonary/Chest: Effort normal. No respiratory distress. He has no wheezes. He has rales ( Diffuse rales, worse at  the bases bilaterally).  Abdominal: Soft. Bowel sounds are normal. He exhibits no distension and no mass. There is no tenderness.  Musculoskeletal: Normal range of motion. He exhibits no edema and no tenderness.  Lymphadenopathy:    He has no cervical adenopathy.  Neurological: He is alert. Coordination normal.  Skin: Skin is warm and dry. No rash noted. No erythema.  Psychiatric: He has a normal mood and affect. His behavior is normal.    ED Course  Procedures (including critical care time)  Labs Reviewed  PRO B NATRIURETIC PEPTIDE - Abnormal; Notable for the following:    Pro B Natriuretic peptide (BNP) 479.6 (*)     All other components within normal limits  CBC WITH DIFFERENTIAL - Abnormal; Notable for the following:    Hemoglobin 12.5 (*)     HCT 36.7 (*)     RDW 15.6 (*)     All other components within normal limits  POCT I-STAT, CHEM 8 - Abnormal; Notable for the following:    Creatinine, Ser 1.40 (*)  Glucose, Bld 139 (*)     Hemoglobin 12.2 (*)     HCT 36.0 (*)     All other components within normal limits  POCT I-STAT TROPONIN I  INFLUENZA PANEL BY PCR   Dg Chest 2 View  05/04/2012  *RADIOLOGY REPORT*  Clinical Data: Shortness of breath.  CHEST - 2 VIEW  Comparison: 04/06/2012 and prior chest radiographs  Findings: Mild cardiomegaly and CABG changes again noted. There is no evidence of focal airspace disease, pulmonary edema, suspicious pulmonary nodule/mass, pleural effusion, or pneumothorax. No acute bony abnormalities are identified.  IMPRESSION: No evidence of acute cardiopulmonary disease.   Original Report Authenticated By: Margarette Canada, M.D.      1. Upper respiratory infection       MDM  The patient has no peripheral edema, no JVD, soft abdomen and moist mucous membranes. His vital signs reveal an oxygen level of 97% on room air but he does have rales on exam I suspect this is related to chronic lung disease. We'll obtain some laboratory data to rule out  other sources of her shortness of breath including a BNP, troponin, EKG and a chest x-ray. He does have a history of coronary disease, CABG and a coronary catheterization with stenting in the last 2 years per the patient's report.   ED ECG REPORT  I personally interpreted this EKG   Date: 05/04/2012   Rate: 67  Rhythm: normal sinus rhythm  QRS Axis: left  Intervals: normal  ST/T Wave abnormalities: normal  Conduction Disutrbances:none  Narrative Interpretation: LVH present by voltage criteria in aVL.  Old EKG Reviewed: Compared with 04/06/2012, no significant changes other than mild LVH criteria now present.   PA and lateral views of the chest were obtained by digital radiography. I have personally interpreted these x-rays and find her to be no signs of pulmonary infiltrate, cardiomegaly, subdiaphragmatic free air, soft tissue abnormality, no obvious bony abnormalities or fractures.  Laboratory workup shows no acute findings, I have discussed with the patient and his family members all of their findings and have also discussed the care with the pulmonary critical care doctor, Dr. Curt Jews who agrees with antibiotics, flu panel and Tamiflu. The patient appears well, he can followup in the office, Dr. Curt Jews has stated that he will have the office call for an appointment.   Johnna Acosta, MD 05/04/12 [phone]

## 2012-05-03 NOTE — ED Notes (Signed)
Pt speaking full sentences

## 2012-05-03 NOTE — ED Notes (Addendum)
Pt arrived from with c/o fever, Nausea, and cough with SOB.

## 2012-05-04 ENCOUNTER — Telehealth: Payer: Self-pay | Admitting: Internal Medicine

## 2012-05-04 ENCOUNTER — Emergency Department (HOSPITAL_COMMUNITY): Payer: Medicare Other

## 2012-05-04 ENCOUNTER — Telehealth: Payer: Self-pay | Admitting: *Deleted

## 2012-05-04 LAB — POCT I-STAT, CHEM 8
BUN: 21 mg/dL (ref 6–23)
Calcium, Ion: 1.16 mmol/L (ref 1.13–1.30)
Chloride: 106 mEq/L (ref 96–112)
HCT: 36 % — ABNORMAL LOW (ref 39.0–52.0)
Potassium: 4 mEq/L (ref 3.5–5.1)
Sodium: 143 mEq/L (ref 135–145)

## 2012-05-04 LAB — CBC WITH DIFFERENTIAL/PLATELET
Basophils Absolute: 0 10*3/uL (ref 0.0–0.1)
Basophils Relative: 0 % (ref 0–1)
HCT: 36.7 % — ABNORMAL LOW (ref 39.0–52.0)
Lymphocytes Relative: 20 % (ref 12–46)
MCHC: 34.1 g/dL (ref 30.0–36.0)
Monocytes Absolute: 0.7 10*3/uL (ref 0.1–1.0)
Neutro Abs: 5.8 10*3/uL (ref 1.7–7.7)
Neutrophils Relative %: 67 % (ref 43–77)
RDW: 15.6 % — ABNORMAL HIGH (ref 11.5–15.5)
WBC: 8.7 10*3/uL (ref 4.0–10.5)

## 2012-05-04 LAB — INFLUENZA PANEL BY PCR (TYPE A & B)
H1N1 flu by pcr: NOT DETECTED
Influenza A By PCR: NEGATIVE
Influenza B By PCR: NEGATIVE

## 2012-05-04 LAB — PRO B NATRIURETIC PEPTIDE: Pro B Natriuretic peptide (BNP): 479.6 pg/mL — ABNORMAL HIGH (ref 0–450)

## 2012-05-04 LAB — POCT I-STAT TROPONIN I: Troponin i, poc: 0 ng/mL (ref 0.00–0.08)

## 2012-05-04 MED ORDER — OSELTAMIVIR PHOSPHATE 75 MG PO CAPS: 75.0000 mg | ORAL_CAPSULE | Freq: Two times a day (BID) | ORAL | Status: DC

## 2012-05-04 MED ORDER — LEVOFLOXACIN 500 MG PO TABS: 500.0000 mg | ORAL_TABLET | Freq: Every day | ORAL | Status: DC

## 2012-05-04 NOTE — ED Notes (Signed)
Patient transported to X-ray 

## 2012-05-04 NOTE — Telephone Encounter (Signed)
[facility] Triage Call Report Triage Record Num: [id number] Operator: Oletta Lamas Patient Name: [name] Call Date & Time: 05/03/2012 5:56:04PM Patient Phone: [phone] PCP: Viviana Simpler Patient Gender: Male PCP Fax : [phone] Patient DOB: [birth year] Practice Name: [facility] Reason for Call: Caller: [name]/Patient; PCP: Viviana Simpler (Family Practice); CB#: [phone]. 05/03/12 - Patient is feeling nauseous, tired and has a fever. Normally has breathing problems, but it is worse tonight. Can't take temp at this time. Had flu vaccine, but does not think he has been exposed. Emergent Sign/Symptom of "any new or worsening lower respiratory tract symptoms AND temperature elevation" per dDabetes: Respiratory Problems Protocol (disposition-see in emergency room). Advised patient be seen and evaluated at the Emergency Room. Patient stated he would try and get someone to take him there. Protocol(s) Used: Breathing Problems Protocol(s) Used: Diabetes: Respiratory Problems Recommended Outcome per Protocol: Call Provider Immediately Reason for Outcome: Diabetes and breathing problems Any new or worsening lower respiratory tract symptoms (productive cough with colored sputum or increasing sputum, short of breath, faster rate of breathing) AND any temperature elevation Care Advice: ~ 05/03/2012 6:14:53PM Page 1 of 1 [email or web address]

## 2012-05-04 NOTE — Telephone Encounter (Signed)
Message copied by Amado Coe on Thu May 04, 2012 11:53 AM ------      Message from: Simonne Maffucci B      Created: Thu May 04, 2012  2:18 AM       Hi All,            This patient was seen in the [facility] ED on 1/30 in the early AM with cough by Dr. Sabra Heck.  He was discharged home from the ED with Levaquin and Tamiflu.  He requested that we set up a follow up appointment soon.              Can the patient be seen on Friday (1/31) by anyone or on Monday 2/3?            Ideally if he can see Dr. Chase Caller that would be best, but not required.            Thanks,      [employer]

## 2012-05-04 NOTE — ED Notes (Signed)
MD at bedside. 

## 2012-05-04 NOTE — Telephone Encounter (Signed)
Pt has been scheduled for f/u with MR on Fri., 05/05/12 @ 9am. Pt aware.

## 2012-05-04 NOTE — Telephone Encounter (Signed)
He was seen in the ER yesterday and sent home Has follow up with Dr Lake Bells Please call him though to see how he is doing

## 2012-05-04 NOTE — ED Notes (Signed)
MD at bedside.miller 

## 2012-05-05 ENCOUNTER — Ambulatory Visit (INDEPENDENT_AMBULATORY_CARE_PROVIDER_SITE_OTHER): Payer: Medicare Other | Admitting: Internal Medicine

## 2012-05-05 ENCOUNTER — Telehealth: Payer: Self-pay | Admitting: Internal Medicine

## 2012-05-05 ENCOUNTER — Encounter: Payer: Self-pay | Admitting: Internal Medicine

## 2012-05-05 VITALS — BP 120/78 | HR 69 | Temp 97.5°F | Ht 73.0 in | Wt 211.0 lb

## 2012-05-05 DIAGNOSIS — J8410 Pulmonary fibrosis, unspecified: Secondary | ICD-10-CM

## 2012-05-05 DIAGNOSIS — J849 Interstitial pulmonary disease, unspecified: Secondary | ICD-10-CM

## 2012-05-05 NOTE — Assessment & Plan Note (Addendum)
Looks like a recent acute bronchitis but in terms of chronic dyspnea he is not any better in fact he is worse. Pulmonary function test shows worsening of spirometry but improvement in diffusion capacity. I do not know how to interpret this for now I will let him continue [facility] which is empiric. We will get a CT scan of the chest to see if there is any worsening of interstitial lung disease and based on this consider bronchoscopy for opportunistic infection versus talking to Dr. [name] about changing Imuran to CellCept. He will definitely need it and pulmonary rehabilitation . In addition [place] in the phone note to [place] to make sure that his cardiac stent his work working well

## 2012-05-05 NOTE — Telephone Encounter (Signed)
Pt was seen today by pulmonary and feels ok, still on meds, will follow-up as needed

## 2012-05-05 NOTE — Telephone Encounter (Signed)
Hi [name].   [name] s continues to complain of worsening dyspnea. Given his multiple issues and finding it difficult to sort out what really the problem has. It took a few years finally figured out his interstitial lung disease that was getting worse due to rheumatoid arthritis that was a new problem. He started immunomodulating treatment with rheumatologist but he says that this only improved joint pain but not dyspnea. Then it looks like between [place]  2013 and winter of 2013 here cardiac stent. However he still says that his dyspnea is not any better and in fact it is getting worse. I did a pulmonary function test and this shows preserved diffusion capacity but worsening spirometry so I'm not sure how to interpret this. I walked him in the office and he did not desaturate. I think he might be deconditioned and he has always refused to go to rehabilitation but this time I forcerd him to agree. However, I want to make sure that his cardiac status and particularly stent is okay because he just had  emergency room visit for acute respiratory distress  Wondering if youi could take a look. I am getting a ct chest for ILD changes   Dr. Brand Males, M.D., [facility].C.P Pulmonary and Critical Care Medicine Staff Physician [place] [facility] Pager: [phone], If no answer or between  15:00h - 7:00h: call [phone]  05/05/2012 5:30 PM

## 2012-05-05 NOTE — Patient Instructions (Addendum)
Looks like an acute bronchitis. Please complete emergency room instructions of antibiotics and Tamiflu Regarding chronic worsening of shortness of breath some aspects of your breathing test the worse and some  Better, so have CT scan of the chest Based on results of CT scan of the chest I will discuss with Dr. Abel Presto about changing your rheumatoid arthritis treatment or doing a bronchoscopy to rule out opportunistic infection

## 2012-05-05 NOTE — Progress Notes (Signed)
Subjective:    Patient ID: [name], male    DOB: 09/18/1931, 79 y.o.   MRN: [id number]  HPI #[id number] - since July-Sept 2009  - Coronary artery disease. s/p CABG x 5 in 2002.  Dyspnea started following lopressor Jan-July 2009 and lisinopril increae July 2009. No relief despite stopping agents July 2010  -   Myoview on April 17, 2007 was  nonischemic with an EF of 51% and inferobasal wall scar.   - CAth Oct 2009:    - . Severe native three-vessel coronary artery disease. 2. Status post multivessel coronary bypass surgery with all grafts  patent. Normal LVEF and LVEDP - PFT - Mixed pattern on spiro. Mild restn on lung volumeswith near normal DLCO.   - Fev1 2.2L/73%, FVC 3.21L/70%, Ratio 68 (67),  TLC 4.7L/68%, RV 1.5L/55%, DLCO 79%.  -CPST 05/06/2008: Normal effot.    - Reduced VO2 max 20.5/65%, REduced AT 8.2/40%, Normal breathing reservce of 55%, submaximal heart rate response 112/77%,  flattened O2 pulse response at peak exercise - 20ml/beat at 85%. No VQ mismatch abnormalities. - ECHO 09/04/2008: LVH, ef 60%, mild AS and unchanged from prior   - CT chest   - Non specific Interstitial Lung disease NOS. Stable pulm infilratates RLL ggo > LLL ggo. 03/2008 -> 10/2008 -> June 2011: stable - Rehab: never attended 2009-2013 due to cost   OV 10/05/2011 Last seen September 22, 2009 and discharged from followup. He never attended rehab due to cost issues. Now reports that on 09/05/11 noticed abrupt dyspnea worsened from class 1 to 3. Also, edema. No chest pain, hemoptysis. So, 09/09/11 went to ER. Trop, BNP normal. No d-didimer. CXR reported as stable but am wondering if there is increased ILD. Given lasix and now edema better and dyspnea better and > 75% towads baseline  Walking desat test : resting 98/94% -> 3 laps x 185 feet; HR 113/pulse ox 96%   Past, Family, Social reviewed: no change since last visit in 2 year. No major changes to health in 2 years  REC Do not know why exactly you are more short  of breath The main difference I think we notice compared to 2 years ago is the slightly more crackling noise in lung but this is not showing up on walking test or xray Best you have breathing test called PFT Return to see me or NP or DR Halford Chessman  for results of PFt in next 4 weeks   OV 11/22/2011 Followup test results: Dyspnea is unchanged since last. IN review of tests: PFTs July 2013 show worsening restriction since 2010 along with reduced diffusion. The RLL baseline GGO might be worse in July 2013 compared to 2011. In addition, autommune profile shows VERY HIGH TITERS of CCP antibody > 300 which is pathognomonic of rheumtoid arthritis lung involvement. RF is only borderline elevated a 23   PFT 10/12/11>>FEV1 2.02 (70%), Fvc 2.8/72%, TLC 4.07 (59%), DLCO 62%, no BD: RESTRICTION - WORSE since 2010 -(2010:  Fev1 2.2L/73%, FVC 3.21L/70%, Ratio 68 (67),  TLC 4.7L/68%, RV 1.5L/55%, DLCO 79%)   Echo 05/04/11>>mild LVH, EF 55 to 123456, grade 1 diastolic dysfx, mild AS, mild MR, PAS 34 mmHg  CT 11/02/11 - calcification suspicious for aortic valve and some GGO esp RLL > LLL (? RLL worse compared to 2011)   Past, Family, Social reviewed: new problem is that  rT flank and RUQ pain. No associated weight loss but has gained. Come and goes. Mild pain. Unclear what aggravating factors  and relieving factors. Annoying quality.    Have blood work today related to lung disease  I will talk to Dr Johnsie Cancel  I will call you back after results are in and after talking to Dr Johnsie Cancel  ...  Update - see phone note 11/26/11. I will send it to Dr Johnsie Cancel   Telephone call 8/22/013 hutoimmune test results: RF and CCP antibodies positive. Esp CCP strongly positive. 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 Dyspnea is from this +/- joint pain that he might be having. He needs immune     Cardiology office note November 2013  He was admitted 9/27-10/1 with chest pain. Myocardial infarction was ruled out. Cardiac  catheterization demonstrated high-grade disease in the SVG-RCA as well as ostial PDA distal to the graft. He had some complications from his diagnostic cath with groin hematoma. He was brought back to the Cath Lab several days later for PCI with DES to the [facility] and cutting PTCA to the [place]. Dual antiplatelet therapy recommended for one year.  LHC 12/31/11: dLM 80%, LAD 100%, circumflex 100%, RCA 100%, proximal SVG-OM 30%, SVG-D1 ok, SVG-RCA 99% distal and 80% ostial PDA distal to graft, LIMA-LAD ok with some left to right collaterals.  PCI 01/03/12: Promus DES to the SVG-RCA and Cutting Balloon angioplasty to the ostial PDA.  Echocardiogram 12/31/11: EF 99991111, grade 1 diastolic dysfunction, mild aortic stenosis, mean gradient 9, mild LAE.  Labs (10/13): K 4.5, creatinine 1.29, ALT 14, LDL 69, Hgb 12.5  Since d/c, he is doing well. No chest pain. Dyspnea improved. Probably Class IIb. No syncope. No orthopnea, PND, edema   OV 04/27/2012  [name] returns for followup after 6 months. This is for dyspnea out of proportion to his interstitial lung disease. His interstitial lung disease is on the basis of rheumatoid arthritis. Since I last saw him he has seen rheumatologist  Dr [name], who has started him on Imuran and prednisone. He does be that he took the prednisone only for a short time but he continues on Imuran right now. He feels that this intervention only helped his arthritic symptoms which came to light after the diagnosis of serum antibody positivity rheumatoid arthritis. However it does not help his dyspnea. Then in September 2013 J. chest pain and had cardiac catheterization and status post PCI. He followed up with cardiology in November 2013 and was noted to have improved dyspnea but he tells me now that his dyspnea never really improved. Currently he is dyspneic for simple things like tying his shoelaces, bending over. It is also dyspneic when he is sitting and doing nothing and he  feels like he is to take a big deep breath. Is also dyspneic with exertion and relieved with rest. Severity is moderate. Symptoms are unimproved but stable. There is no associated cough. Of note he went to the emergency room early in January 2014 and was given albuterol as needed which he says helps his symptoms. Labs on 04/07/2047 normal. He is really frustrated by his dyspnea.  So far we have never been able to improve his dyspnea. His rheumatologist is concerned about worsening interstitial lung disease  Of note in the past we've had several discussions about attending pulmonary rehabilitation but due to cost factors he is refused  I screened him about depression and anxiety and he denies this.  Past, Family, Social reviewed: no change since last visit other than noted as above   rec I understand it is frustrating that  they've not been able to improve your shortness of breath despite several interventions and new diagnoses  Please repeat pulmonary function testing and if it shows the pulmonary fibrosis is getting worse I will talk to her rheumatologist about changing some medications  - However this is only meant to prevent further worsening and will not improve things from where they are  In order to improve things from where they are you absolutely need pulmonary rehabilitation and I made a referral to them. We will work with them about reducing cost  also start empiric dulera samples; he will opick this up when he comes for PFT  Followup based on our phone conversation of pulmonary function test results   Ov 05/05/2012  [name] returns acute followup.. After his visit last week he did have pulmonary function test on 04/28/2012 when compared to his pre-Imuran pulmonary function test in July 2013 current pulmonary function test shows worsening in terms of spirometry with improvement in terms of diffusion capacity. Specifically his FEV1 is now 1.77 L/62% and this is down by 12%. His FVC is  2.62 L/59% and this is decreased by 7%. His total lung capacity is 4 L/59% and this is unchanged. His diffusion capacity is 15.7/71% and this is actually improved by 11%. He is started later a few days ago but this has not made any impact in his symptoms.  However yesterday he developed an acute symptomatology of shortness of breath and some nausea and up in the emergency room. Labs are listed below which have reviewed in the look okay. Emergency room services discharge him on Tamiflu and antibiotics. He is currently feeling better. There no new issues other than chronic worsening of dyspnea  Walking desaturation test on 05/05/2012 185 feet x 3 laps:  did NOT desaturate. Rest pulse ox was 98%, final pulse ox was 96%. HR response 69/min at rest to 82/min at peak exertion.   PFT FVC fev1 ratio BD fev1 TLC DLCO comment intervention  2010 3.2L/70% 2.2L/73%   4.7L/68% 79%    10/12/11 2.8L/72% 2.02L/70% 72  4.07L/59% 62% Worsening restriction since 2010 Rheum will start immuran/pred. Also had PCI in nov 2013  04/28/12 2.6L/59% 1.77L/62% 67  4.0L/59% 15.7/71%        Lab 05/04/12 0027 05/04/12 0010  HGB 12.2* 12.5*  HCT 36.0* 36.7*  WBC -- 8.7  PLT -- 170   '  Lab 05/04/12 0027  NA 143  K 4.0  CL 106  CO2 --  GLUCOSE 139*  BUN 21  CREATININE 1.40*  CALCIUM --  MG --  PHOS --    No results found for this basename: TROPONINI:5 in the last 168 hours   Lab 05/04/12 0010  PROBNP 479.6*    Dg Chest 2 View  05/04/2012  *RADIOLOGY REPORT*  Clinical Data: Shortness of breath.  CHEST - 2 VIEW  Comparison: 04/06/2012 and prior chest radiographs  Findings: Mild cardiomegaly and CABG changes again noted. There is no evidence of focal airspace disease, pulmonary edema, suspicious pulmonary nodule/mass, pleural effusion, or pneumothorax. No acute bony abnormalities are identified.  IMPRESSION: No evidence of acute cardiopulmonary disease.   Original Report Authenticated By: Margarette Canada, M.D.          Review of Systems  Constitutional: Negative for fever and unexpected weight change.  HENT: Negative for ear pain, nosebleeds, congestion, sore throat, rhinorrhea, sneezing, trouble swallowing, dental problem, postnasal drip and sinus pressure.   Eyes: Negative for redness and itching.  Respiratory: Positive  for cough and shortness of breath. Negative for chest tightness and wheezing.   Cardiovascular: Negative for palpitations and leg swelling.  Gastrointestinal: Negative for nausea and vomiting.  Genitourinary: Negative for dysuria.  Musculoskeletal: Negative for joint swelling.  Skin: Negative for rash.  Neurological: Negative for headaches.  Hematological: Does not bruise/bleed easily.  Psychiatric/Behavioral: Negative for dysphoric mood. The patient is not nervous/anxious.        Objective:   Physical Exam General:  normal appearance and obese.  Same as 2 years ago Head:  Normocephalic and atraumatic. Eyes:  PERRLA, no icterus. Nose:  Narrow nasal angles Mouth:  MP3, 2+ tonsills, poor dentition, retrognathic Neck:  no JVD, no thyromegaly Chest Wall:  Symmetrical,  no deformities . Lungs:  clear bilaterally to auscultation and percussion: CRACKLES + BOTTOM 1/3rd -  Heart:  regular rhythm and normal rate.   Cardiac: Grade 2 murmur in aortic area + Abdomen:  Soft, nontender and nondistended. Msk:  Symmetrical with no gross deformities. Normal posture. Pulses:  Normal pulses noted.         Assessment & Plan:

## 2012-05-09 ENCOUNTER — Ambulatory Visit (INDEPENDENT_AMBULATORY_CARE_PROVIDER_SITE_OTHER)
Admission: RE | Admit: 2012-05-09 | Discharge: 2012-05-09 | Disposition: A | Discharge status: Home / Self Care | Payer: Medicare Other | Source: Ambulatory Visit | Attending: Internal Medicine | Admitting: Internal Medicine

## 2012-05-09 DIAGNOSIS — J8410 Pulmonary fibrosis, unspecified: Secondary | ICD-10-CM

## 2012-05-09 DIAGNOSIS — J849 Interstitial pulmonary disease, unspecified: Secondary | ICD-10-CM

## 2012-05-22 ENCOUNTER — Encounter: Payer: Self-pay | Admitting: Internal Medicine

## 2012-05-26 ENCOUNTER — Telehealth: Payer: Self-pay | Admitting: Internal Medicine

## 2012-05-26 NOTE — Telephone Encounter (Signed)
Spoke to patient. CT scan of the chest 05/09/2012 does not show much change. I'm not really sure why he is more dyspneic whether it is his lungs [PFT slightly worse but CT scan unchanged so data points are not congruous] or his heart [most recently. By cardiology] or deconditioning  I again emphasized to him that if he does not go to pulmonary rehabilitation and show that his dyspnea is unimproved despite pulmonary rehabilitation we will have to subject him to more testing.  Based on this  - He has agreed to order pulmonary rehabilitation. He lives in [place] and and therefore I will get him to rehabilitation at [facility] which is faster to get than the rehabilitation [facility]  - I will also sees cardiology can see him within the next month   Thanks  MR  Cc: To Cherly Hensen andDR Marlowe [place], please see if cardiology can move up the appointment to see him within the next one month. Please give him appointment to see me in 3 months

## 2012-05-29 NOTE — Telephone Encounter (Signed)
Pt advised. Jennifer Castillo, CMA  

## 2012-05-29 NOTE — Telephone Encounter (Signed)
LMTCBx1 with the pt. I called cardiology and have the pt scheduled to see Dr. Johnsie Cancel on 06-26-12 at 8:45am. [place], [place]

## 2012-06-02 ENCOUNTER — Other Ambulatory Visit (INDEPENDENT_AMBULATORY_CARE_PROVIDER_SITE_OTHER): Payer: Medicare Other

## 2012-06-08 ENCOUNTER — Encounter: Payer: Self-pay | Admitting: *Deleted

## 2012-06-26 ENCOUNTER — Ambulatory Visit (INDEPENDENT_AMBULATORY_CARE_PROVIDER_SITE_OTHER): Payer: Medicare Other | Admitting: Cardiovascular Disease

## 2012-06-26 VITALS — BP 132/78 | HR 72 | Ht 74.0 in | Wt 215.8 lb

## 2012-06-26 DIAGNOSIS — I10 Essential (primary) hypertension: Secondary | ICD-10-CM

## 2012-06-26 DIAGNOSIS — I2510 Atherosclerotic heart disease of native coronary artery without angina pectoris: Secondary | ICD-10-CM

## 2012-06-26 DIAGNOSIS — E785 Hyperlipidemia, unspecified: Secondary | ICD-10-CM

## 2012-06-26 DIAGNOSIS — J8410 Pulmonary fibrosis, unspecified: Secondary | ICD-10-CM

## 2012-06-26 DIAGNOSIS — I359 Nonrheumatic aortic valve disorder, unspecified: Secondary | ICD-10-CM

## 2012-06-26 DIAGNOSIS — J849 Interstitial pulmonary disease, unspecified: Secondary | ICD-10-CM

## 2012-06-26 DIAGNOSIS — E1159 Type 2 diabetes mellitus with other circulatory complications: Secondary | ICD-10-CM

## 2012-06-26 NOTE — Assessment & Plan Note (Signed)
Most significant issue in regard to his dyspnea Crackles in lungs seem worse to me F/U pulmonary

## 2012-06-26 NOTE — Patient Instructions (Signed)
Your physician wants you to follow-up in:  6 MONTHS WITH DR NISHAN  You will receive a reminder letter in the mail two months in advance. If you don't receive a letter, please call our office to schedule the follow-up appointment. Your physician recommends that you continue on your current medications as directed. Please refer to the Current Medication list given to you today. 

## 2012-06-26 NOTE — Assessment & Plan Note (Signed)
Well controlled.  Continue current medications and low sodium Dash type diet.    

## 2012-06-26 NOTE — Assessment & Plan Note (Signed)
Cholesterol is at goal.  Continue current dose of statin and diet Rx.  No myalgias or side effects.  F/U  LFT's in 6 months. Lab Results  Component Value Date   LDLCALC 69 01/01/2012

## 2012-06-26 NOTE — Assessment & Plan Note (Signed)
Discussed low carb diet.  Target hemoglobin A1c is 6.5 or less.  Continue current medications.  

## 2012-06-26 NOTE — Progress Notes (Signed)
Patient ID: [name], male   DOB: Feb 11, 1932, 79 y.o.   MRN: [id number] [name] is a 79 y.o. male who returns for follow up   He has a history of CAD, s/p CABG, interstitial lung disease, mild aortic stenosis, HTN, HL, DM2, CKD, rheumatoid arthritis. He has chronic dyspnea. He was admitted 9/27-10/13 with chest pain. Myocardial infarction was ruled out. Cardiac catheterization demonstrated high-grade disease in the SVG-RCA as well as ostial PDA distal to the graft. He had some complications from his diagnostic cath with groin hematoma. He was brought back to the Cath Lab several days later for PCI with DES to the [facility] and cutting PTCA to the [place]. Dual antiplatelet therapy recommended for one year.   LHC 12/31/11: dLM 80%, LAD 100%, circumflex 100%, RCA 100%, proximal SVG-OM 30%, SVG-D1 ok, SVG-RCA 99% distal and 80% ostial PDA distal to graft, LIMA-LAD ok with some left to right collaterals.  PCI 01/03/12: Promus DES to the SVG-RCA and Cutting Balloon angioplasty to the ostial PDA.   Echocardiogram 12/31/11: EF 99991111, grade 1 diastolic dysfunction, mild aortic stenosis, mean gradient 9, mild LAE.   Labs (10/13): K 4.5, creatinine 1.29, ALT 14, LDL 69, Hgb 12.5   Since d/c, he is doing well. No chest pain. Dyspnea improved. Probably Class IIb. No syncope. No orthopnea, PND, edema. Groin is doing ok.  PFT;s 1/14 with moderate restrictive lung defect and mild decrease in diffusing capacity  ROS: Denies fever, malais, weight loss, blurry vision, decreased visual acuity, cough, sputum, SOB, hemoptysis, pleuritic pain, palpitaitons, heartburn, abdominal pain, melena, lower extremity edema, claudication, or rash.  All other systems reviewed and negative  General: Affect appropriate Healthy:  appears stated age 50: normal Neck supple with no adenopathy JVP normal no bruits no thyromegaly Lungs interstitial crackles at bases  no wheezing and good diaphragmatic motion Heart:  S1/S2 midl  AS murmur, no rub, gallop or click PMI normal Abdomen: benighn, BS positve, no tenderness, no AAA no bruit.  No HSM or HJR Distal pulses intact with no bruits No edema Neuro non-focal Skin warm and dry No muscular weakness Right knee TKR with brace   Current Outpatient Prescriptions  Medication Sig Dispense Refill  . amLODipine (NORVASC) 10 MG tablet TAKE 1 TABLET (10 MG TOTAL) BY MOUTH DAILY.  30 tablet  10  . aspirin 81 MG tablet Take 81 mg by mouth daily.        [name] azaTHIOprine (IMURAN) 50 MG tablet Take 50 mg by mouth 2 (two) times daily.      [name] BRILINTA 90 MG TABS tablet TAKE 1 TABLET (90 MG TOTAL) BY MOUTH 2 (TWO) TIMES DAILY.  60 tablet  3  . carvedilol (COREG) 12.5 MG tablet Take 12.5 mg by mouth 2 (two) times daily with a meal.      . furosemide (LASIX) 20 MG tablet Take 20 mg by mouth daily as needed. For edema      . gabapentin (NEURONTIN) 300 MG capsule TAKE 3 CAPSULES BY MOUTH TWICE A DAY  180 capsule  5  . levofloxacin (LEVAQUIN) 500 MG tablet Take 1 tablet (500 mg total) by mouth daily.  7 tablet  0  . losartan (COZAAR) 100 MG tablet TAKE 1 TABLET (100 MG TOTAL) BY MOUTH DAILY.  30 tablet  10  . metFORMIN (GLUCOPHAGE) 1000 MG tablet Take 1 tablet (1,000 mg total) by mouth 2 (two) times daily with a meal.  60 tablet  11  . mometasone-formoterol (DULERA)  100-5 MCG/ACT AERO Inhale 1 puff into the lungs 2 (two) times daily as needed. For shortness of breath      . Multiple Vitamins-Minerals (ICAPS) CAPS Take by mouth daily.       . nitroGLYCERIN (NITROSTAT) 0.4 MG SL tablet Place 1 tablet (0.4 mg total) under the tongue every 5 (five) minutes as needed. Chest pain  25 tablet  4  . nortriptyline (PAMELOR) 25 MG capsule Take 1-2 capsules (25-50 mg total) by mouth at bedtime.  60 capsule  1  . omeprazole (PRILOSEC OTC) 20 MG tablet Take 20 mg by mouth daily.        . pravastatin (PRAVACHOL) 40 MG tablet Take 1 tablet (40 mg total) by mouth daily.  30 tablet  11  . vitamin B-12  (CYANOCOBALAMIN) 1000 MCG tablet Take 1,000 mcg by mouth daily.       No current facility-administered medications for this visit.    Allergies  Doxazosin mesylate and Methocarbamol  Electrocardiogram:   05/07/12 SR rate 62 LAD othewise normal  Assessment and Plan

## 2012-06-26 NOTE — Assessment & Plan Note (Signed)
No change in murmur F/U echo in a year

## 2012-06-26 NOTE — Assessment & Plan Note (Signed)
Stable with no angina and good activity level.  Continue medical Rx  

## 2012-06-28 ENCOUNTER — Encounter: Payer: Self-pay | Admitting: Internal Medicine

## 2012-06-28 ENCOUNTER — Ambulatory Visit (INDEPENDENT_AMBULATORY_CARE_PROVIDER_SITE_OTHER): Payer: Medicare Other | Admitting: Internal Medicine

## 2012-06-28 VITALS — BP 142/80 | HR 63 | Temp 97.4°F | Wt 213.0 lb

## 2012-06-28 DIAGNOSIS — E1149 Type 2 diabetes mellitus with other diabetic neurological complication: Secondary | ICD-10-CM

## 2012-06-28 DIAGNOSIS — E1142 Type 2 diabetes mellitus with diabetic polyneuropathy: Secondary | ICD-10-CM

## 2012-06-28 MED ORDER — GABAPENTIN 300 MG PO CAPS: 900.0000 mg | ORAL_CAPSULE | Freq: Three times a day (TID) | ORAL | Status: DC

## 2012-06-28 NOTE — Patient Instructions (Signed)
Please increase the gabapentin to 900mg  three times a day Add 1 extra 300mg  pill daily for the next three days to get to the full dose Please start the nortriptylline again at bedtime---try 1 pill and increase to 2 after a week if no side effects and the foot pain continues to be an issue

## 2012-06-28 NOTE — Assessment & Plan Note (Signed)
Worsened neuropathic pain Will try increasing gabapentin to 900 tid May need to add tramadol or hydrocodone Consider increasing the nortriptylline

## 2012-06-28 NOTE — Progress Notes (Signed)
Subjective:    Patient ID: [name], male    DOB: 11/17/1931, 79 y.o.   MRN: [id number]  HPI Having burning and pain in his feet all the time Uses diabetic shoes and socks Goes back a long time but worse now Day and night  On gabapentin 900mg  bid Bothered by it more during the day--is usually able to go to sleep Not sedated by this that he can tell  No sores  Current Outpatient Prescriptions on File Prior to Visit  Medication Sig Dispense Refill  . amLODipine (NORVASC) 10 MG tablet TAKE 1 TABLET (10 MG TOTAL) BY MOUTH DAILY.  30 tablet  10  . aspirin 81 MG tablet Take 81 mg by mouth daily.        [name] azaTHIOprine (IMURAN) 50 MG tablet Take 50 mg by mouth 2 (two) times daily.      [name] BRILINTA 90 MG TABS tablet TAKE 1 TABLET (90 MG TOTAL) BY MOUTH 2 (TWO) TIMES DAILY.  60 tablet  3  . carvedilol (COREG) 12.5 MG tablet Take 12.5 mg by mouth 2 (two) times daily with a meal.      . furosemide (LASIX) 20 MG tablet Take 20 mg by mouth daily as needed. For edema      . gabapentin (NEURONTIN) 300 MG capsule TAKE 3 CAPSULES BY MOUTH TWICE A DAY  180 capsule  5  . losartan (COZAAR) 100 MG tablet TAKE 1 TABLET (100 MG TOTAL) BY MOUTH DAILY.  30 tablet  10  . metFORMIN (GLUCOPHAGE) 1000 MG tablet Take 1 tablet (1,000 mg total) by mouth 2 (two) times daily with a meal.  60 tablet  11  . mometasone-formoterol (DULERA) 100-5 MCG/ACT AERO Inhale 1 puff into the lungs 2 (two) times daily as needed. For shortness of breath      . Multiple Vitamins-Minerals (ICAPS) CAPS Take by mouth daily.       . nitroGLYCERIN (NITROSTAT) 0.4 MG SL tablet Place 1 tablet (0.4 mg total) under the tongue every 5 (five) minutes as needed. Chest pain  25 tablet  4  . nortriptyline (PAMELOR) 25 MG capsule Take 1-2 capsules (25-50 mg total) by mouth at bedtime.  60 capsule  1  . omeprazole (PRILOSEC OTC) 20 MG tablet Take 20 mg by mouth daily.        . pravastatin (PRAVACHOL) 40 MG tablet Take 1 tablet (40 mg total) by  mouth daily.  30 tablet  11  . vitamin B-12 (CYANOCOBALAMIN) 1000 MCG tablet Take 1,000 mcg by mouth daily.       No current facility-administered medications on file prior to visit.    Allergies  Allergen Reactions  . Doxazosin Mesylate Other (See Comments)    REACTION: dizzy  . Methocarbamol Rash    "don't remember how bad"    Past Medical History  Diagnosis Date  . Hyperlipidemia   . Hypertension   . GERD (gastroesophageal reflux disease)   . Arthritis     osteoarthritis, s/p R TKR, and digits  . Overweight (BMI 25.0-29.9)     BMI 29  . Interstitial lung disease     NOS  . Pulmonary infiltrates 12-09    stable, 7/10: ? due to GERD  . Diverticulosis   . Hiatal hernia   . Esophageal stricture     s/p dilation spring 2010  . CAD (coronary artery disease)     symptoms followed lopressor Jan-Jul 09 and lisinopril increase July 09, 1/09 Myoview- non ischemic with  an EF of 51% and inferobasal wall scar, 10/09 Cath- severe native three-vessel coronary artery disease, s/p multivessel coronary bypass graft with all grafts, Normal LV sys fn with normal LV filling pressures; 10/04-Adenosine cardiolite- thinning wo ischemia, EF 55%; 1/08 Cardiolite neg  . History of PFTs     mixed pattern on spiro. mild restn on lung volumes with near normal DLCO. Pattern can be explained by CABG scar. Fev1 2.2L/73%, ratio 68 (67), TLC 4.7/68%,RV 1.5L/55%,DLCO 79%  . Colon polyps   . Chronic kidney disease, stage III (moderate)   . Chronic diastolic CHF (congestive heart failure)     a) 09/13 ECHO- LVEF 99991111, grade 1 diastolic dysfunction, mild LA dilatation, atrial septal aneurysm, AV mobility restricted, but no sig AS by doppler; b) 09/04/08 ECHO- LVH, ef 60%, mild AS,  . Heart murmur   . Anginal pain   . Dyspnea 2009 since July -Sept    05/06/08-CPST-  normal effort, reduced VO2 max 20.5 /65%, reduced at 8.2/ 40%, normal breathing resetvca of 55%, submaximal heart rate response 112/77%, flattened o2  pluse response at peak exercise-12 ml/beat @ 85%, No VQ mismatch abnormalities, All c/w CIRC Limitation  . Type II diabetes mellitus   . Chronic lower back pain     Past Surgical History  Procedure Laterality Date  . Total knee arthroplasty  03/2010    Right- [name]  . Cholecystectomy  11/2003    [name]- open  . Knee arthroscopy  2008    right  . Cataract extraction w/ intraocular lens  implant, bilateral    . Coronary artery bypass graft  11/1999    CABG X5  . Cardiac catheterization  5/06    CP- no MI, Cath- small vessell disease  . Cardiac catheterization  12/31/2011    80% distal LM, 100% native LAD, LCx and RCA, 30% prox SVG-OM, SVG-D1 normal, 99% distal, 80% ostial SVG-RCA distal to graft, LIMA-LAD normal; LVEF mildly decreased with posterior basal AK  . Ptca  01/03/2012    Successful DES to SVG-RCA and cutting balloon angioplasty ostial  PDA    Family History  Problem Relation Age of Onset  . COPD Mother   . Heart disease Father   . Diabetes Brother   . Colon cancer Brother   . Alcohol abuse Sister     History   Social History  . Marital Status: Married    Spouse Name: N/A    Number of Children: N/A  . Years of Education: N/A   Occupational History  . lawn mower     retired   Social History Main Topics  . Smoking status: Former Smoker -- 1.00 packs/day for 20 years    Types: Cigarettes    Quit date: 04/06/1963  . Smokeless tobacco: Never Used  . Alcohol Use: Yes     Comment: 01/01/2012 "last alcohol ~ 50 yr ago"  . Drug Use: No  . Sexually Active: Not Currently   Other Topics Concern  . Not on file   Social History Narrative   Has children   Review of Systems No fever Not depressed    Objective:   Physical Exam  Musculoskeletal:  See diabetic foot exam          Assessment & Plan:

## 2012-07-12 ENCOUNTER — Other Ambulatory Visit (HOSPITAL_COMMUNITY): Payer: Self-pay | Admitting: Orthopaedic Surgery

## 2012-07-12 ENCOUNTER — Other Ambulatory Visit: Payer: Self-pay | Admitting: Orthopaedic Surgery

## 2012-07-12 DIAGNOSIS — M25561 Pain in right knee: Secondary | ICD-10-CM

## 2012-07-12 DIAGNOSIS — M25512 Pain in left shoulder: Secondary | ICD-10-CM

## 2012-07-13 ENCOUNTER — Encounter (HOSPITAL_COMMUNITY)
Admission: RE | Admit: 2012-07-13 | Discharge: 2012-07-13 | Disposition: A | Discharge status: Home / Self Care | Payer: Medicare Other | Source: Ambulatory Visit | Attending: Orthopaedic Surgery | Admitting: Orthopaedic Surgery

## 2012-07-13 DIAGNOSIS — M25469 Effusion, unspecified knee: Secondary | ICD-10-CM | POA: Insufficient documentation

## 2012-07-13 DIAGNOSIS — M1710 Unilateral primary osteoarthritis, unspecified knee: Secondary | ICD-10-CM | POA: Insufficient documentation

## 2012-07-13 DIAGNOSIS — M25561 Pain in right knee: Secondary | ICD-10-CM

## 2012-07-13 DIAGNOSIS — Z96659 Presence of unspecified artificial knee joint: Secondary | ICD-10-CM | POA: Insufficient documentation

## 2012-07-13 MED ORDER — TECHNETIUM TC 99M MEDRONATE IV KIT
27.0000 | PACK | Freq: Once | INTRAVENOUS | Status: AC | PRN
  Administered 2012-07-13: 27 via INTRAVENOUS

## 2012-07-18 ENCOUNTER — Ambulatory Visit
Admission: RE | Admit: 2012-07-18 | Discharge: 2012-07-18 | Disposition: A | Discharge status: Home / Self Care | Payer: Medicare Other | Source: Ambulatory Visit | Attending: Orthopaedic Surgery | Admitting: Orthopaedic Surgery

## 2012-07-18 DIAGNOSIS — M25512 Pain in left shoulder: Secondary | ICD-10-CM

## 2012-08-18 ENCOUNTER — Encounter: Payer: Self-pay | Admitting: Internal Medicine

## 2012-08-29 ENCOUNTER — Encounter: Payer: Self-pay | Admitting: Internal Medicine

## 2012-08-29 ENCOUNTER — Ambulatory Visit (INDEPENDENT_AMBULATORY_CARE_PROVIDER_SITE_OTHER): Payer: Medicare Other | Admitting: Internal Medicine

## 2012-08-29 VITALS — BP 148/70 | HR 66 | Temp 97.9°F | Wt 216.0 lb

## 2012-08-29 DIAGNOSIS — I10 Essential (primary) hypertension: Secondary | ICD-10-CM

## 2012-08-29 DIAGNOSIS — E785 Hyperlipidemia, unspecified: Secondary | ICD-10-CM

## 2012-08-29 DIAGNOSIS — Z0000 Encounter for general adult medical examination without abnormal findings: Secondary | ICD-10-CM

## 2012-08-29 DIAGNOSIS — E1149 Type 2 diabetes mellitus with other diabetic neurological complication: Secondary | ICD-10-CM

## 2012-08-29 DIAGNOSIS — J849 Interstitial pulmonary disease, unspecified: Secondary | ICD-10-CM

## 2012-08-29 DIAGNOSIS — J8410 Pulmonary fibrosis, unspecified: Secondary | ICD-10-CM

## 2012-08-29 DIAGNOSIS — G5790 Unspecified mononeuropathy of unspecified lower limb: Secondary | ICD-10-CM

## 2012-08-29 DIAGNOSIS — I5032 Chronic diastolic (congestive) heart failure: Secondary | ICD-10-CM

## 2012-08-29 NOTE — Assessment & Plan Note (Signed)
BP Readings from Last 3 Encounters:  08/29/12 148/70  06/28/12 142/80  06/26/12 132/78   Generally fair control No changes

## 2012-08-29 NOTE — Progress Notes (Signed)
  Subjective:    Patient ID: [name], male    DOB: 01-09-1932, 79 y.o.   MRN: [id number]  HPI    Review of Systems     Objective:   Physical Exam  Pulmonary/Chest: He has rales.  Bibasilar crackles persist          Assessment & Plan:

## 2012-08-29 NOTE — Assessment & Plan Note (Signed)
Lab Results  Component Value Date   LDLCALC 69 01/01/2012   At goal

## 2012-08-29 NOTE — Assessment & Plan Note (Signed)
Compensated °No fluid overload °

## 2012-08-29 NOTE — Assessment & Plan Note (Signed)
Discussed trying more gabapentin at bedtime

## 2012-08-29 NOTE — Progress Notes (Signed)
Subjective:    Patient ID: [name], male    DOB: 04/20/1931, 79 y.o.   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 amLODipine (NORVASC) 10 MG tablet TAKE 1 TABLET (10 MG TOTAL) BY MOUTH DAILY.  30 tablet  10  . aspirin 81 MG tablet Take 81 mg by mouth daily.        [name] azaTHIOprine (IMURAN) 50 MG tablet Take 50 mg by mouth 2 (two) times daily.      [name] BRILINTA 90 MG TABS tablet TAKE 1 TABLET (90 MG TOTAL) BY MOUTH 2 (TWO) TIMES DAILY.  60 tablet  3  . carvedilol (COREG) 12.5 MG tablet Take 12.5 mg by mouth 2 (two) times daily with a meal.      . furosemide (LASIX) 20 MG tablet Take 20 mg by mouth daily as needed. For edema      . gabapentin (NEURONTIN) 300 MG capsule Take 3 capsules (900 mg total) by mouth 3 (three) times daily.  270 capsule  5  . losartan (COZAAR) 100 MG tablet TAKE 1 TABLET (100 MG TOTAL) BY MOUTH DAILY.  30 tablet  10  . metFORMIN (GLUCOPHAGE) 1000 MG tablet Take 1 tablet (1,000 mg total) by mouth 2 (two) times daily with a meal.  60 tablet  11  . mometasone-formoterol (DULERA) 100-5 MCG/ACT AERO Inhale 1 puff into the lungs 2 (two) times daily as needed. For shortness of breath      . Multiple Vitamins-Minerals (ICAPS) CAPS Take by mouth daily.       . nitroGLYCERIN (NITROSTAT) 0.4 MG SL tablet Place 1 tablet (0.4 mg total) under the tongue every 5 (five) minutes as needed.  Chest pain  25 tablet  4  . nortriptyline (PAMELOR) 25 MG capsule Take 1-2 capsules (25-50 mg total) by mouth at bedtime.  60 capsule  1  . omeprazole (PRILOSEC OTC) 20 MG tablet Take 20 mg by mouth daily.        . pravastatin (PRAVACHOL) 40 MG tablet Take 1 tablet (40 mg total) by mouth daily.  30 tablet  11  . vitamin B-12 (CYANOCOBALAMIN) 1000 MCG tablet Take 1,000 mcg by mouth daily.       No current facility-administered medications on file prior to visit.    Allergies  Allergen Reactions  . Doxazosin Mesylate Other (See Comments)    REACTION: dizzy  . Methocarbamol Rash    "don't remember how bad"    Past Medical History  Diagnosis Date  . Hyperlipidemia   . Hypertension   . GERD (gastroesophageal reflux disease)   . Arthritis     osteoarthritis, s/p R TKR, and digits  . Overweight (BMI 25.0-29.9)     BMI 29  . Interstitial lung disease     NOS  . Pulmonary infiltrates 12-09    stable, 7/10: ? due to GERD  . Diverticulosis   . Hiatal hernia   .  Esophageal stricture     s/p dilation spring 2010  . CAD (coronary artery disease)     symptoms followed lopressor Jan-Jul 09 and lisinopril increase July 09, 1/09 Myoview- non ischemic with an EF of 51% and inferobasal wall scar, 10/09 Cath- severe native three-vessel coronary artery disease, s/p multivessel coronary bypass graft with all grafts, Normal LV sys fn with normal LV filling pressures; 10/04-Adenosine cardiolite- thinning wo ischemia, EF 55%; 1/08 Cardiolite neg  . History of PFTs     mixed pattern on spiro. mild restn on lung volumes with near normal DLCO. Pattern can be explained by CABG scar. Fev1 2.2L/73%, ratio 68 (67), TLC 4.7/68%,RV 1.5L/55%,DLCO 79%  . Colon polyps   . Chronic kidney disease, stage III (moderate)   . Chronic diastolic CHF (congestive heart failure)     a) 09/13 ECHO- LVEF 99991111, grade 1 diastolic dysfunction, mild LA dilatation, atrial septal aneurysm, AV mobility restricted, but no sig AS by  doppler; b) 09/04/08 ECHO- LVH, ef 60%, mild AS,  . Heart murmur   . Anginal pain   . Dyspnea 2009 since July -Sept    05/06/08-CPST-  normal effort, reduced VO2 max 20.5 /65%, reduced at 8.2/ 40%, normal breathing resetvca of 55%, submaximal heart rate response 112/77%, flattened o2 pluse response at peak exercise-12 ml/beat @ 85%, No VQ mismatch abnormalities, All c/w CIRC Limitation  . Type II diabetes mellitus   . Chronic lower back pain     Past Surgical History  Procedure Laterality Date  . Total knee arthroplasty  03/2010    Right- [name]  . Cholecystectomy  11/2003    [name]- open  . Knee arthroscopy  2008    right  . Cataract extraction w/ intraocular lens  implant, bilateral    . Coronary artery bypass graft  11/1999    CABG X5  . Cardiac catheterization  5/06    CP- no MI, Cath- small vessell disease  . Cardiac catheterization  12/31/2011    80% distal LM, 100% native LAD, LCx and RCA, 30% prox SVG-OM, SVG-D1 normal, 99% distal, 80% ostial SVG-RCA distal to graft, LIMA-LAD normal; LVEF mildly decreased with posterior basal AK  . Ptca  01/03/2012    Successful DES to SVG-RCA and cutting balloon angioplasty ostial  PDA    Family History  Problem Relation Age of Onset  . COPD Mother   . Heart disease Father   . Diabetes Brother   . Colon cancer Brother   . Alcohol abuse Sister     History   Social History  . Marital Status: Married    Spouse Name: N/A    Number of Children: N/A  . Years of Education: N/A   Occupational History  . lawn mower     retired   Social History Main Topics  . Smoking status: Former Smoker -- 1.00 packs/day for 20 years    Types: Cigarettes    Quit date: 04/06/1963  . Smokeless tobacco: Never Used  . Alcohol Use: Yes     Comment: 01/01/2012 "last alcohol ~ 50 yr ago"  . Drug Use: No  . Sexually Active: Not Currently   Other Topics Concern  . Not on file   Social History Narrative   No living will   Requests wife as health  care POA   Discussed DNR --he requests this (done 08/29/12)   Not sure about feeding tube---but might accept for some time   Review of Systems  Constitutional: Negative for fatigue  and unexpected weight change.       Wears seat belt  HENT: Positive for hearing loss and congestion. Negative for rhinorrhea, dental problem and tinnitus.        Regular with dentist  Eyes: Positive for visual disturbance.       Feels his vision is "shrinking"---blurry. Due for recheck  Respiratory: Positive for shortness of breath. Negative for cough and chest tightness.   Cardiovascular: Negative for chest pain, palpitations and leg swelling.  Gastrointestinal: Positive for abdominal pain. Negative for nausea, vomiting, constipation and blood in stool.       Mild pain from right ventral hernia No sig heartburn while on the med  Endocrine: Negative for cold intolerance and heat intolerance.  Genitourinary: Positive for difficulty urinating.       Has hesitancy of urine  No dribbling Nocturia x 2---stable  Musculoskeletal: Positive for back pain and arthralgias.       Right side pain --?hip Torn muscle in left shoulder---trying to hold off on surgery  Skin: Negative for rash.       No suspicious lesions  Allergic/Immunologic: Negative for environmental allergies and immunocompromised state.  Neurological: Positive for numbness. Negative for dizziness, syncope, weakness, light-headedness and headaches.  Hematological: Negative for adenopathy. Does not bruise/bleed easily.  Psychiatric/Behavioral: Positive for sleep disturbance. Negative for dysphoric mood. The patient is not nervous/anxious.        Some sleep issues with legs still       Objective:   Physical Exam  Constitutional: He is oriented to person, place, and time. He appears well-developed and well-nourished. No distress.  HENT:  Head: Normocephalic and atraumatic.  Right Ear: External ear normal.  Left Ear: External ear normal.   Mouth/Throat: Oropharynx is clear and moist. No oropharyngeal exudate.  Eyes: Conjunctivae and EOM are normal. Pupils are equal, round, and reactive to light.  Neck: Normal range of motion. Neck supple. No thyromegaly present.  Cardiovascular: Normal rate, regular rhythm and intact distal pulses.  Exam reveals no gallop.   Murmur heard. Faint pedal pulses Gr 2/6 aortic systolic murmur  Pulmonary/Chest: Effort normal and breath sounds normal. No respiratory distress. He has no wheezes. He has no rales.  Abdominal: Soft. There is no tenderness.  Musculoskeletal: He exhibits no edema and no tenderness.  Lymphadenopathy:    He has no cervical adenopathy.  Neurological: He is alert and oriented to person, place, and time.  Skin: Skin is warm. No rash noted. No erythema.  Psychiatric: He has a normal mood and affect. His behavior is normal.          Assessment & Plan:

## 2012-08-29 NOTE — Patient Instructions (Signed)
Please try increasing the night dose of gabapentin to 4-5 pills (1200-1500mg ) and hold off on the lunch dose

## 2012-08-29 NOTE — Assessment & Plan Note (Signed)
Stable DOE Starting pulmonary rehab

## 2012-08-29 NOTE — Assessment & Plan Note (Signed)
Lab Results  Component Value Date   HGBA1C 6.6* 06/02/2012   Good control Will recheck at next visit

## 2012-08-29 NOTE — Assessment & Plan Note (Signed)
DNR done Prefers no zostavax UTD otherwise

## 2012-09-03 ENCOUNTER — Encounter: Payer: Self-pay | Admitting: Internal Medicine

## 2012-09-11 ENCOUNTER — Other Ambulatory Visit: Payer: Self-pay

## 2012-09-11 MED ORDER — TICAGRELOR 90 MG PO TABS: ORAL_TABLET | ORAL | Status: DC

## 2012-09-11 NOTE — Telephone Encounter (Signed)
..   Requested Prescriptions   Signed Prescriptions Disp Refills  . Ticagrelor (BRILINTA) 90 MG TABS tablet 60 tablet 6    Sig: TAKE 1 TABLET (90 MG TOTAL) BY MOUTH 2 (TWO) TIMES DAILY.    Authorizing Provider: [name]    Ordering User: [name], Francely Craw Jerilynn Mages

## 2012-10-03 ENCOUNTER — Encounter: Payer: Self-pay | Admitting: Internal Medicine

## 2012-10-03 ENCOUNTER — Other Ambulatory Visit: Payer: Self-pay | Admitting: *Deleted

## 2012-10-03 MED ORDER — GABAPENTIN 300 MG PO CAPS: ORAL_CAPSULE | ORAL | Status: DC

## 2012-10-03 NOTE — Telephone Encounter (Signed)
Received refill request from pharmacy for Gabapentin with a note that pt has new directions. Per 08/29/12 ov pt was advised try  "increasing the night dose of gabapentin to 4-5 pills (1200-1500mg ) and hold off on the lunch dose"  Does this mean he is to take 3 tabs (900) mg in am, no lunch dose then 4-5 tabs qhs?

## 2012-10-03 NOTE — Telephone Encounter (Signed)
rx sent to pharmacy by e-script  

## 2012-10-03 NOTE — Telephone Encounter (Signed)
Okay for a year

## 2012-10-09 ENCOUNTER — Other Ambulatory Visit: Payer: Self-pay | Admitting: Internal Medicine

## 2012-11-03 ENCOUNTER — Encounter: Payer: Self-pay | Admitting: Internal Medicine

## 2012-11-07 ENCOUNTER — Other Ambulatory Visit: Payer: Self-pay | Admitting: Internal Medicine

## 2012-12-04 ENCOUNTER — Encounter: Payer: Self-pay | Admitting: Internal Medicine

## 2012-12-13 ENCOUNTER — Encounter: Payer: Self-pay | Admitting: Internal Medicine

## 2012-12-13 ENCOUNTER — Ambulatory Visit (INDEPENDENT_AMBULATORY_CARE_PROVIDER_SITE_OTHER): Payer: Medicare Other | Admitting: Internal Medicine

## 2012-12-13 VITALS — BP 150/80 | HR 71 | Temp 98.6°F | Wt 217.0 lb

## 2012-12-13 DIAGNOSIS — G5790 Unspecified mononeuropathy of unspecified lower limb: Secondary | ICD-10-CM

## 2012-12-13 DIAGNOSIS — Z23 Encounter for immunization: Secondary | ICD-10-CM

## 2012-12-13 MED ORDER — NORTRIPTYLINE HCL 25 MG PO CAPS: 25.0000 mg | ORAL_CAPSULE | Freq: Every day | ORAL | Status: DC

## 2012-12-13 NOTE — Progress Notes (Signed)
Subjective:    Patient ID: [name], male    DOB: 1931-04-28, 79 y.o.   MRN: [id number]  HPI Having more problems with the neuropathy Mostly from forefoot out to toes Constant pain--or at least 95% of the time Jabbing type pain  Gabapentin 900mg  AM and 1500mg  bedtime  Wears diabetic shoes with inserts  Current Outpatient Prescriptions on File Prior to Visit  Medication Sig Dispense Refill  . amLODipine (NORVASC) 10 MG tablet TAKE 1 TABLET (10 MG TOTAL) BY MOUTH DAILY.  30 tablet  10  . aspirin 81 MG tablet Take 81 mg by mouth daily.        [name] azaTHIOprine (IMURAN) 50 MG tablet Take 50 mg by mouth 2 (two) times daily.      . carvedilol (COREG) 12.5 MG tablet Take 12.5 mg by mouth 2 (two) times daily with a meal.      . furosemide (LASIX) 20 MG tablet Take 20 mg by mouth daily as needed. For edema      . gabapentin (NEURONTIN) 300 MG capsule Take 3 tablets by mouth in the morning and 4-5 tablets at bedtime  240 capsule  11  . losartan (COZAAR) 100 MG tablet TAKE 1 TABLET (100 MG TOTAL) BY MOUTH DAILY.  30 tablet  10  . metFORMIN (GLUCOPHAGE) 1000 MG tablet TAKE 1 TABLET TWICE A DAY WITH MEAL  60 tablet  11  . mometasone-formoterol (DULERA) 100-5 MCG/ACT AERO Inhale 1 puff into the lungs 2 (two) times daily as needed. For shortness of breath      . Multiple Vitamins-Minerals (ICAPS) CAPS Take by mouth daily.       . nitroGLYCERIN (NITROSTAT) 0.4 MG SL tablet Place 1 tablet (0.4 mg total) under the tongue every 5 (five) minutes as needed. Chest pain  25 tablet  4  . nortriptyline (PAMELOR) 25 MG capsule Take 1-2 capsules (25-50 mg total) by mouth at bedtime.  60 capsule  1  . omeprazole (PRILOSEC OTC) 20 MG tablet Take 20 mg by mouth daily.        . pravastatin (PRAVACHOL) 40 MG tablet TAKE 1 TABLET EVERY DAY  30 tablet  11  . Ticagrelor (BRILINTA) 90 MG TABS tablet TAKE 1 TABLET (90 MG TOTAL) BY MOUTH 2 (TWO) TIMES DAILY.  60 tablet  6  . vitamin B-12 (CYANOCOBALAMIN) 1000 MCG  tablet Take 1,000 mcg by mouth daily.       No current facility-administered medications on file prior to visit.    Allergies  Allergen Reactions  . Doxazosin Mesylate Other (See Comments)    REACTION: dizzy  . Methocarbamol Rash    "don't remember how bad"    Past Medical History  Diagnosis Date  . Hyperlipidemia   . Hypertension   . GERD (gastroesophageal reflux disease)   . Arthritis     osteoarthritis, s/p R TKR, and digits  . Overweight (BMI 25.0-29.9)     BMI 29  . Interstitial lung disease     NOS  . Pulmonary infiltrates 12-09    stable, 7/10: ? due to GERD  . Diverticulosis   . Hiatal hernia   . Esophageal stricture     s/p dilation spring 2010  . CAD (coronary artery disease)     symptoms followed lopressor Jan-Jul 09 and lisinopril increase July 09, 1/09 Myoview- non ischemic with an EF of 51% and inferobasal wall scar, 10/09 Cath- severe native three-vessel coronary artery disease, s/p multivessel coronary bypass graft with all grafts,  Normal LV sys fn with normal LV filling pressures; 10/04-Adenosine cardiolite- thinning wo ischemia, EF 55%; 1/08 Cardiolite neg  . History of PFTs     mixed pattern on spiro. mild restn on lung volumes with near normal DLCO. Pattern can be explained by CABG scar. Fev1 2.2L/73%, ratio 68 (67), TLC 4.7/68%,RV 1.5L/55%,DLCO 79%  . Colon polyps   . Chronic kidney disease, stage III (moderate)   . Chronic diastolic CHF (congestive heart failure)     a) 09/13 ECHO- LVEF 99991111, grade 1 diastolic dysfunction, mild LA dilatation, atrial septal aneurysm, AV mobility restricted, but no sig AS by doppler; b) 09/04/08 ECHO- LVH, ef 60%, mild AS,  . Heart murmur   . Anginal pain   . Dyspnea 2009 since July -Sept    05/06/08-CPST-  normal effort, reduced VO2 max 20.5 /65%, reduced at 8.2/ 40%, normal breathing resetvca of 55%, submaximal heart rate response 112/77%, flattened o2 pluse response at peak exercise-12 ml/beat @ 85%, No VQ mismatch  abnormalities, All c/w CIRC Limitation  . Type II diabetes mellitus   . Chronic lower back pain     Past Surgical History  Procedure Laterality Date  . Total knee arthroplasty  03/2010    Right- [name]  . Cholecystectomy  11/2003    [name]- open  . Knee arthroscopy  2008    right  . Cataract extraction w/ intraocular lens  implant, bilateral    . Coronary artery bypass graft  11/1999    CABG X5  . Cardiac catheterization  5/06    CP- no MI, Cath- small vessell disease  . Cardiac catheterization  12/31/2011    80% distal LM, 100% native LAD, LCx and RCA, 30% prox SVG-OM, SVG-D1 normal, 99% distal, 80% ostial SVG-RCA distal to graft, LIMA-LAD normal; LVEF mildly decreased with posterior basal AK  . Ptca  01/03/2012    Successful DES to SVG-RCA and cutting balloon angioplasty ostial  PDA    Family History  Problem Relation Age of Onset  . COPD Mother   . Heart disease Father   . Diabetes Brother   . Colon cancer Brother   . Alcohol abuse Sister     History   Social History  . Marital Status: Married    Spouse Name: N/A    Number of Children: N/A  . Years of Education: N/A   Occupational History  . lawn mower     retired   Social History Main Topics  . Smoking status: Former Smoker -- 1.00 packs/day for 20 years    Types: Cigarettes    Quit date: 04/06/1963  . Smokeless tobacco: Never Used  . Alcohol Use: Yes     Comment: 01/01/2012 "last alcohol ~ 50 yr ago"  . Drug Use: No  . Sexual Activity: Not Currently   Other Topics Concern  . Not on file   Social History Narrative   No living will   Requests wife as health care POA   Discussed DNR --he requests this (done 08/29/12)   Not sure about feeding tube---but might accept for some time   Review of Systems Is able to sleep---doesn't awaken No problems during exercise per se Now going to pulmonary rehab--seems to be helping his breathing    Objective:   Physical Exam  Constitutional: He appears  well-developed and well-nourished. No distress.  Cardiovascular: Intact distal pulses.   Musculoskeletal:  Pain in forefoot but no clear bony tenderness  Skin:  No foot lesions  Assessment & Plan:

## 2012-12-13 NOTE — Assessment & Plan Note (Signed)
Pain in feet is worse Pattern seems like there might be a mechanical component as well Doesn't think he is taking the nortriptylline---will have him try this again Try stability sports shoes instead of the hard diabetic shoes Consider consult with Dr Lorelei Pont

## 2012-12-13 NOTE — Addendum Note (Signed)
Addended by: Despina Hidden on: 12/13/2012 03:23 PM   Modules accepted: Orders

## 2012-12-13 NOTE — Patient Instructions (Signed)
Please restart the nortriptylline at 25mg  at bedtime. If needed, and no problems with it, you can increase to 50mg  after a week.  Try wearing a stability sports shoe with your inserts to see if that helps your foot pain

## 2012-12-26 ENCOUNTER — Ambulatory Visit (INDEPENDENT_AMBULATORY_CARE_PROVIDER_SITE_OTHER): Payer: Medicare Other | Admitting: Cardiovascular Disease

## 2012-12-26 ENCOUNTER — Encounter: Payer: Self-pay | Admitting: Cardiovascular Disease

## 2012-12-26 VITALS — BP 138/78 | HR 64 | Ht 74.0 in | Wt 219.0 lb

## 2012-12-26 DIAGNOSIS — E785 Hyperlipidemia, unspecified: Secondary | ICD-10-CM

## 2012-12-26 DIAGNOSIS — I10 Essential (primary) hypertension: Secondary | ICD-10-CM

## 2012-12-26 DIAGNOSIS — I2510 Atherosclerotic heart disease of native coronary artery without angina pectoris: Secondary | ICD-10-CM

## 2012-12-26 DIAGNOSIS — E1159 Type 2 diabetes mellitus with other circulatory complications: Secondary | ICD-10-CM

## 2012-12-26 MED ORDER — NITROGLYCERIN 0.4 MG SL SUBL: 0.4000 mg | SUBLINGUAL_TABLET | SUBLINGUAL | Status: DC | PRN

## 2012-12-26 NOTE — Progress Notes (Signed)
Patient ID: [name], male   DOB: 03-Jan-1932, 79 y.o.   MRN: [id number] [name] is a 79 y.o. male who returns for follow up  He has a history of CAD, s/p CABG, interstitial lung disease, mild aortic stenosis, HTN, HL, DM2, CKD, rheumatoid arthritis. He has chronic dyspnea. He was admitted 9/27-10/13 with chest pain. Myocardial infarction was ruled out. Cardiac catheterization demonstrated high-grade disease in the SVG-RCA as well as ostial PDA distal to the graft. He had some complications from his diagnostic cath with groin hematoma. He was brought back to the Cath Lab several days later for PCI with DES to the [facility] and cutting PTCA to the [place]. Dual antiplatelet therapy recommended for one year.  LHC 12/31/11: dLM 80%, LAD 100%, circumflex 100%, RCA 100%, proximal SVG-OM 30%, SVG-D1 ok, SVG-RCA 99% distal and 80% ostial PDA distal to graft, LIMA-LAD ok with some left to right collaterals.  PCI 01/03/12: Promus DES to the SVG-RCA and Cutting Balloon angioplasty to the ostial PDA.  Echocardiogram 12/31/11: EF 99991111, grade 1 diastolic dysfunction, mild aortic stenosis, mean gradient 9, mild LAE.  Labs (10/13): K 4.5, creatinine 1.29, ALT 14, LDL 69, Hgb 12.5  Since d/c, he is doing well. No chest pain. Dyspnea improved. Probably Class IIb. No syncope. No orthopnea, PND, edema. Groin is doing ok.  PFT;s 1/14 with moderate restrictive lung defect and mild decrease in diffusing capacity  ROS: Denies fever, malais, weight loss, blurry vision, decreased visual acuity, cough, sputum, SOB, hemoptysis, pleuritic pain, palpitaitons, heartburn, abdominal pain, melena, lower extremity edema, claudication, or rash.  All other systems reviewed and negative  General: Affect appropriate Healthy:  appears stated age 35: normal Neck supple with no adenopathy JVP normal no bruits no thyromegaly Lungs clear with no wheezing and good diaphragmatic motion Heart:  S1/S2 no murmur, no rub, gallop or  click PMI normal Abdomen: benighn, BS positve, no tenderness, no AAA no bruit.  No HSM or HJR Distal pulses intact with no bruits No edema Neuro non-focal Skin warm and dry No muscular weakness   Current Outpatient Prescriptions  Medication Sig Dispense Refill  . amLODipine (NORVASC) 10 MG tablet TAKE 1 TABLET (10 MG TOTAL) BY MOUTH DAILY.  30 tablet  10  . aspirin 81 MG tablet Take 81 mg by mouth daily.        [name] azaTHIOprine (IMURAN) 50 MG tablet Take 50 mg by mouth 2 (two) times daily.      . carvedilol (COREG) 12.5 MG tablet Take 12.5 mg by mouth 2 (two) times daily with a meal.      . furosemide (LASIX) 20 MG tablet Take 20 mg by mouth daily as needed. For edema      . gabapentin (NEURONTIN) 300 MG capsule Take 3 tablets by mouth in the morning and 4-5 tablets at bedtime  240 capsule  11  . losartan (COZAAR) 100 MG tablet TAKE 1 TABLET (100 MG TOTAL) BY MOUTH DAILY.  30 tablet  10  . metFORMIN (GLUCOPHAGE) 1000 MG tablet TAKE 1 TABLET TWICE A DAY WITH MEAL  60 tablet  11  . mometasone-formoterol (DULERA) 100-5 MCG/ACT AERO Inhale 1 puff into the lungs 2 (two) times daily as needed. For shortness of breath      . Multiple Vitamins-Minerals (ICAPS) CAPS Take by mouth daily.       . nitroGLYCERIN (NITROSTAT) 0.4 MG SL tablet Place 1 tablet (0.4 mg total) under the tongue every 5 (five) minutes as needed.  Chest pain  25 tablet  4  . nortriptyline (PAMELOR) 25 MG capsule Take 1-2 capsules (25-50 mg total) by mouth at bedtime.  60 capsule  11  . omeprazole (PRILOSEC OTC) 20 MG tablet Take 20 mg by mouth daily.        . pravastatin (PRAVACHOL) 40 MG tablet TAKE 1 TABLET EVERY DAY  30 tablet  11  . Ticagrelor (BRILINTA) 90 MG TABS tablet TAKE 1 TABLET (90 MG TOTAL) BY MOUTH 2 (TWO) TIMES DAILY.  60 tablet  6  . vitamin B-12 (CYANOCOBALAMIN) 1000 MCG tablet Take 1,000 mcg by mouth daily.       No current facility-administered medications for this visit.    Allergies  Doxazosin mesylate  and Methocarbamol  Electrocardiogram:  SR rate 67 nonspecific ST/T wave changes  Assessment and Plan

## 2012-12-26 NOTE — Assessment & Plan Note (Signed)
Cholesterol is at goal.  Continue current dose of statin and diet Rx.  No myalgias or side effects.  F/U  LFT's in 6 months. Lab Results  Component Value Date   LDLCALC 69 01/01/2012

## 2012-12-26 NOTE — Assessment & Plan Note (Signed)
Well controlled.  Continue current medications and low sodium Dash type diet.    

## 2012-12-26 NOTE — Assessment & Plan Note (Signed)
Stable with no angina and good activity level.  Continue medical Rx  

## 2012-12-26 NOTE — Patient Instructions (Signed)
Your physician wants you to follow-up in:  6 MONTHS WITH DR NISHAN  You will receive a reminder letter in the mail two months in advance. If you don't receive a letter, please call our office to schedule the follow-up appointment. Your physician recommends that you continue on your current medications as directed. Please refer to the Current Medication list given to you today. 

## 2012-12-26 NOTE — Assessment & Plan Note (Signed)
Discussed low carb diet.  Target hemoglobin A1c is 6.5 or less.  Continue current medications.  

## 2013-01-03 ENCOUNTER — Encounter: Payer: Self-pay | Admitting: Internal Medicine

## 2013-01-08 ENCOUNTER — Telehealth: Payer: Self-pay | Admitting: Cardiovascular Disease

## 2013-01-08 NOTE — Telephone Encounter (Signed)
LMTCB ./CY 

## 2013-01-08 NOTE — Telephone Encounter (Signed)
New problem    Office calling stating that patient drop off form requesting cardiac clearance in may. Please advise.     Can brilianta be hold prior to surgery

## 2013-01-11 NOTE — Telephone Encounter (Signed)
LMTCB ./CY 

## 2013-01-11 NOTE — Telephone Encounter (Signed)
[place]

## 2013-01-16 ENCOUNTER — Telehealth: Payer: Self-pay | Admitting: Cardiovascular Disease

## 2013-01-16 NOTE — Telephone Encounter (Signed)
Spoke with patient.  Patient states the cost of Brilinta went from $45 per month to $120 per month and he is wondering if he can stop taking the medication.  I inquired as to whether patient has or pharmacy has contacted insurance company to see if cost can be reduced and patient states the pharmacy did call and there is no cost reduction available.  Patient states his stent was placed 03/01/12 - hospital note states procedure was 12/31/11. I have placed a 30 day supply at the front desk for the patient and patient is aware; states he will pick up the medication today or tomorrow.  I am routing message to Dr. Johnsie Cancel for advice regarding Brilinta therapy.

## 2013-01-16 NOTE — Telephone Encounter (Signed)
New problem:  Pt states he wants to know if he can stop taking his blood thinner after this month due to the high cost. Please advise

## 2013-01-16 NOTE — Telephone Encounter (Signed)
Spoke with Kim/[facility] she stated she has Not received Cardiac Clearance back, she is Going to Refax and Is aware Christine/Dr.Nishan not back in Office until Wednesday, Once Completed Please fax to [phone]

## 2013-01-16 NOTE — Telephone Encounter (Signed)
After samples he can stop it

## 2013-01-17 NOTE — Telephone Encounter (Signed)
PT  AWARE  CLEARANCE NOTE  FAXED   CLEARING  PT FOR SURGERY  AND ALSO  NEEDS TO HOLD BRILINTA  5 DAYS PRIOR TO  PROCEDURE .Adonis Housekeeper

## 2013-01-19 LAB — HM DIABETES EYE EXAM

## 2013-01-24 ENCOUNTER — Other Ambulatory Visit: Payer: Self-pay | Admitting: Physician Assistant

## 2013-01-24 ENCOUNTER — Encounter: Payer: Self-pay | Admitting: Internal Medicine

## 2013-02-02 ENCOUNTER — Encounter: Payer: Self-pay | Admitting: Internal Medicine

## 2013-02-08 ENCOUNTER — Other Ambulatory Visit: Payer: Self-pay | Admitting: Internal Medicine

## 2013-02-14 NOTE — H&P (Signed)
[name] Fears, MD   [name] Borg, PA-C [address] [place], [place]  [address]                             [phone]   [place] MRN:  [id number] DOB/SEX:  1931/09/10/male  CHIEF COMPLAINT:  Painful left shoulder and left index finger  HISTORY: In regard to his left shoulder, he was having pain since January in regard to his left shoulder mainly in the upper arm without any history of injury or trauma.  He states he woke up with this pain.  He was having difficulty with motion secondary to the pain.  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  He is unable to reach posteriorly to get into his back pocket with the left shoulder.  He came back and a MRI scan was obtained which did reveal a complete rotator cuff tear which was retracted and supraspinatus tendon near his junction with the infraspinatus tendon.  There was a 2 cm retraction and the tear was 16 mm wide.  The infraspinatus and subscapularis tendinopathy were noted.  There was no significant atrophy of the rotator cuff muscles at that time of his MRI back in April.  There was no muscle tear myositis.  There was tendinopathy involving the intra-articular portion of the biceps tendon, but it was intact.  AC joint had moderate DJD.  There was downsloping of a type 2-3 acromion.  Mild-to-moderate DJD of the glenohumeral joint.  At that time we suggested that he consider having an arthroscopic subacromial decompression, distal clavicle excision as well as mini open rotator cuff repair.  He, however, has put this off until his mowing season was complete and he did go against our wishes.  He returns today now with continuing pain and discomfort in his left shoulder, but also now triggering in his left index finger which is intermittent, but quite painful.  He has had 2 other releases in the past.  He is seen today for  evaluation.     PAST MEDICAL HISTORY: Patient Active Problem List   Diagnosis Date Noted  . Routine general medical examination at a health care facility 08/29/2012  . Chronic kidney disease, stage III (moderate)   . ILD (interstitial lung disease) 11/28/2011  . Chronic diastolic heart failure A999333  . Type II or unspecified type diabetes mellitus with neurological manifestations, not stated as uncontrolled(250.60) 02/16/2011  . Neuropathy of lower extremity 08/10/2010  . DIABETES MELLITUS, WITH VASCULAR COMPLICATIONS [id number]  . OBSTRUCTIVE SLEEP APNEA 12/17/2008  . ESOPHAGEAL STRICTURE 10/09/2008  . Reflux esophagitis 09/10/2008  . DIVERTICULOSIS-COLON 09/10/2008  . AORTIC STENOSIS 03/19/2008  . ACTINIC KERATOSIS 10/23/2007  . SLEEP DISORDER, CHRONIC 10/17/2006  . HYPERLIPIDEMIA 09/23/2006  . HYPERTENSION 09/23/2006  . CORONARY ARTERY DISEASE 09/23/2006  . GERD 09/23/2006  . BENIGN PROSTATIC HYPERTROPHY 09/23/2006   Past Medical History  Diagnosis Date  . Hyperlipidemia   . Hypertension   . GERD (gastroesophageal reflux disease)   . Arthritis     osteoarthritis, s/p R TKR, and digits  . Overweight (BMI 25.0-29.9)     BMI 29  . Interstitial lung disease     NOS  . Pulmonary infiltrates 12-09    stable, 7/10: ? due to GERD  . Diverticulosis   .  Esophageal stricture     s/p dilation spring 2010  . CAD (coronary artery disease)     symptoms followed lopressor Jan-Jul 09 and lisinopril increase July 09, 1/09 Myoview- non ischemic with an EF of 51% and inferobasal wall scar, 10/09 Cath- severe native three-vessel coronary artery disease, s/p multivessel coronary bypass graft with all grafts, Normal LV sys fn with normal LV filling pressures; 10/04-Adenosine cardiolite- thinning wo ischemia, EF 55%; 1/08 Cardiolite neg  . History of PFTs     mixed pattern on spiro. mild restn on lung volumes with near normal DLCO. Pattern can be explained by CABG scar. Fev1 2.2L/73%,  ratio 68 (67), TLC 4.7/68%,RV 1.5L/55%,DLCO 79%  . Colon polyps   . Chronic kidney disease, stage III (moderate)   . Chronic diastolic CHF (congestive heart failure)     a) 09/13 ECHO- LVEF 99991111, grade 1 diastolic dysfunction, mild LA dilatation, atrial septal aneurysm, AV mobility restricted, but no sig AS by doppler; b) 09/04/08 ECHO- LVH, ef 60%, mild AS,  . Heart murmur   . Type II diabetes mellitus   . Chronic lower back pain   . RA (rheumatoid arthritis)     [name]  . Myocardial infarction     pci  . Dyspnea 2009 since July -Sept    05/06/08-CPST-  normal effort, reduced VO2 max 20.5 /65%, reduced at 8.2/ 40%, normal breathing resetvca of 55%, submaximal heart rate response 112/77%, flattened o2 pluse response at peak exercise-12 ml/beat @ 85%, No VQ mismatch abnormalities, All c/w CIRC Limitation  . Hiatal hernia   . Anginal pain 02/24/13   Past Surgical History  Procedure Laterality Date  . Total knee arthroplasty  03/2010    Right- [name]  . Cholecystectomy  11/2003    [name]- open  . Knee arthroscopy  2008    right  . Cataract extraction w/ intraocular lens  implant, bilateral    . Coronary artery bypass graft  11/1999    CABG X5  . Cardiac catheterization  5/06    CP- no MI, Cath- small vessell disease  . Cardiac catheterization  12/31/2011    80% distal LM, 100% native LAD, LCx and RCA, 30% prox SVG-OM, SVG-D1 normal, 99% distal, 80% ostial SVG-RCA distal to graft, LIMA-LAD normal; LVEF mildly decreased with posterior basal AK  . Ptca  01/03/2012    Successful DES to SVG-RCA and cutting balloon angioplasty ostial  PDA  . Eye surgery    . Coronary stent placement       MEDICATIONS:   Prescriptions prior to admission  Medication Sig Dispense Refill  . amLODipine (NORVASC) 10 MG tablet Take 10 mg by mouth daily.      [name] aspirin 81 MG tablet Take 81 mg by mouth daily.        [name] azaTHIOprine (IMURAN) 50 MG tablet Take 50 mg by mouth 2 (two) times daily.      .  carvedilol (COREG) 12.5 MG tablet Take 12.5 mg by mouth 2 (two) times daily with a meal.      . gabapentin (NEURONTIN) 300 MG capsule Take 3 tablets by mouth in the morning and 4-5 tablets at bedtime      . isosorbide mononitrate (IMDUR) 30 MG 24 hr tablet Take 1 tablet (30 mg total) by mouth daily.  30 tablet  6  . losartan (COZAAR) 100 MG tablet Take 100 mg by mouth daily.      . metFORMIN (GLUCOPHAGE) 1000 MG tablet Take 1,000 mg  by mouth 2 (two) times daily with a meal.      . Multiple Vitamins-Minerals (ICAPS) CAPS Take 1 capsule by mouth daily.       . nortriptyline (PAMELOR) 25 MG capsule Take 1-2 capsules (25-50 mg total) by mouth at bedtime.  60 capsule  11  . omeprazole (PRILOSEC OTC) 20 MG tablet Take 20 mg by mouth daily.        . pravastatin (PRAVACHOL) 40 MG tablet Take 40 mg by mouth daily.      . vitamin B-12 (CYANOCOBALAMIN) 1000 MCG tablet Take 1,000 mcg by mouth daily.      . nitroGLYCERIN (NITROSTAT) 0.4 MG SL tablet Place 1 tablet (0.4 mg total) under the tongue every 5 (five) minutes as needed. Chest pain  25 tablet  4  . Ticagrelor (BRILINTA) 90 MG TABS tablet Take 90 mg by mouth 2 (two) times daily.        ALLERGIES:   Allergies  Allergen Reactions  . Doxazosin Mesylate Other (See Comments)    REACTION: dizzy  . Methocarbamol Rash    "don't remember how bad"    REVIEW OF SYSTEMS:  Pertinent items are noted in HPI and PMH   FAMILY HISTORY:   Family History  Problem Relation Age of Onset  . COPD Mother   . Heart disease Father   . Diabetes Brother   . Colon cancer Brother   . Alcohol abuse Sister     SOCIAL HISTORY:   History  Substance Use Topics  . Smoking status: Former Smoker -- 1.00 packs/day for 20 years    Types: Cigarettes    Quit date: 04/06/1963  . Smokeless tobacco: Never Used  . Alcohol Use: No     Comment: 01/01/2012 "last alcohol ~ 50 yr ago"      EXAMINATION: Vital signs in last 24 hours: Temp:  [97.2 F (36.2 C)] 97.2 F (36.2  C) (11/25 1116) Pulse Rate:  [64] 64 (11/25 1116) Resp:  [18] 18 (11/25 1116) BP: (197)/(74) 197/74 mmHg (11/25 1116) SpO2:  [95 %] 95 % (11/25 1116)  Head is normocephalic.   Eyes:  Pupils equal, round and reactive to light and accommodation.  Extraocular intact. ENT: Ears, nose, and throat were benign.   Neck: supple, no bruits were noted.   Chest: good expansion.   Lungs: essentially clear.   Cardiac: regular rhythm and rate, normal S1, S2.  No murmurs appreciated. Pulses :  1+ bilateral and symmetric in upper extremities. Abdomen is scaphoid, soft, nontender, no masses palpable, normal bowel sounds present. CNS:  He is oriented x3 and cranial nerves II-XII grossly intact. Breast, rectal, and genital exams: not performed and not indicated for an orthopedic evaluation. Musculoskeletal: Today he has 150 degrees of abduction and forward flexion passively.  External rotation 70 degrees, internal to 10 degrees.  He has weak abduction and external rotation.  Positive empty can test is noted.  He is tender along the A1 pulley of the left index finger.  Really cannot get the trigger today, but I can certainly feel the nodule.     Imaging Review MRI scan of the left shoulder reveals full thickness retracted tear of the supraspinatus tendon near its junction with infraspinatus tendon.  There is a 2-cm retraction and the tear is 16 mm wide.  Moderate infraspinatus and subscapularis tendinopathy with insertional tears.  There is no significant fatty atrophy of the rotator cuff muscles.  No muscle tear or myositis.  Biceps tendon is intact.  There is tendinopathy involving the intra-articular portion.  The [facility] joint has moderate DJD.  There is lateral downsloping of a type II to III acromion.  Mild to moderate degenerative changes in the glenohumeral joint.  ASSESSMENT: Torn left rotator cuff and left index trigger finger  Past Medical History  Diagnosis Date  . Hyperlipidemia   . Hypertension   .  GERD (gastroesophageal reflux disease)   . Arthritis     osteoarthritis, s/p R TKR, and digits  . Overweight (BMI 25.0-29.9)     BMI 29  . Interstitial lung disease     NOS  . Pulmonary infiltrates 12-09    stable, 7/10: ? due to GERD  . Diverticulosis   . Esophageal stricture     s/p dilation spring 2010  . CAD (coronary artery disease)     symptoms followed lopressor Jan-Jul 09 and lisinopril increase July 09, 1/09 Myoview- non ischemic with an EF of 51% and inferobasal wall scar, 10/09 Cath- severe native three-vessel coronary artery disease, s/p multivessel coronary bypass graft with all grafts, Normal LV sys fn with normal LV filling pressures; 10/04-Adenosine cardiolite- thinning wo ischemia, EF 55%; 1/08 Cardiolite neg  . History of PFTs     mixed pattern on spiro. mild restn on lung volumes with near normal DLCO. Pattern can be explained by CABG scar. Fev1 2.2L/73%, ratio 68 (67), TLC 4.7/68%,RV 1.5L/55%,DLCO 79%  . Colon polyps   . Chronic kidney disease, stage III (moderate)   . Chronic diastolic CHF (congestive heart failure)     a) 09/13 ECHO- LVEF 99991111, grade 1 diastolic dysfunction, mild LA dilatation, atrial septal aneurysm, AV mobility restricted, but no sig AS by doppler; b) 09/04/08 ECHO- LVH, ef 60%, mild AS,  . Heart murmur   . Type II diabetes mellitus   . Chronic lower back pain   . RA (rheumatoid arthritis)     [name]  . Myocardial infarction     pci  . Dyspnea 2009 since July -Sept    05/06/08-CPST-  normal effort, reduced VO2 max 20.5 /65%, reduced at 8.2/ 40%, normal breathing resetvca of 55%, submaximal heart rate response 112/77%, flattened o2 pluse response at peak exercise-12 ml/beat @ 85%, No VQ mismatch abnormalities, All c/w CIRC Limitation  . Hiatal hernia   . Anginal pain 02/24/13    PLAN: Plan for  LEFT SHOULDER ARTHROSCOPY WITH MINI OPEN ROTATOR CUFF REPAIR AND SUBACROMIAL DECOMPRESSION AND DISTAL CLAVICLE RESECTION, POSSIBLE GRAFT JACKET.  Left General RELEASE TRIGGER FINGER/A-1 PULLEY  The procedure,  risks, and benefits of total knee arthroplasty were presented and reviewed. The risks including but not limited to infection, blood clots, vascular and nerve injury, stiffness,  among others were discussed. The patient acknowledged the explanation, agreed to proceed.   Ladarrious Kirksey 02/27/2013, 1:49 PM

## 2013-02-15 ENCOUNTER — Encounter (HOSPITAL_COMMUNITY): Payer: Self-pay | Admitting: Pharmacy Technician

## 2013-02-17 NOTE — Pre-Procedure Instructions (Addendum)
[name]  02/17/2013   Your procedure is scheduled on:  November 25  Report to [facility] Entrance "A" [address] at [employer] AM.  Call this number if you have problems the morning of surgery: [phone]   Remember:   Do not eat food or drink liquids after midnight.   Take these medicines the morning of surgery with A SIP OF WATER: Amlodipine, Carvedilol, Gabapentin, Prilosec, Imuran   STOP Aspirin, Multiple Vitamins, Vitamin B12, Ticagrelor November 20   STOP Aspirin, Aleve, Naproxen, Advil, Ibuprofen, Vitamin, Herbs, and Supplements starting November 20   Do not wear jewelry, make-up or nail polish.  Do not wear lotions, powders, or perfumes. You may wear deodorant.  Do not shave 48 hours prior to surgery. Men may shave face and neck.  Do not bring valuables to the hospital.  [facility] is not responsible                  for any belongings or valuables.               Contacts, dentures or bridgework may not be worn into surgery.  Leave suitcase in the car. After surgery it may be brought to your room.  For patients admitted to the hospital, discharge time is determined by your                treatment team.               Patients discharged the day of surgery will not be allowed to drive  home.  Name and phone number of your driver: Family/ Friend  Special Instructions: Shower using CHG 2 nights before surgery and the night before surgery.  If you shower the day of surgery use CHG.  Use special wash - you have one bottle of CHG for all showers.  You should use approximately 1/3 of the bottle for each shower.   Please read over the following fact sheets that you were given: Pain Booklet, Coughing and Deep Breathing and Surgical Site Infection Prevention

## 2013-02-19 ENCOUNTER — Encounter (HOSPITAL_COMMUNITY)
Admission: RE | Admit: 2013-02-19 | Discharge: 2013-02-19 | Disposition: A | Discharge status: Home / Self Care | Payer: Medicare Other | Source: Ambulatory Visit | Attending: Orthopaedic Surgery | Admitting: Orthopaedic Surgery

## 2013-02-19 ENCOUNTER — Encounter (HOSPITAL_COMMUNITY): Payer: Self-pay

## 2013-02-19 DIAGNOSIS — Z01812 Encounter for preprocedural laboratory examination: Secondary | ICD-10-CM | POA: Insufficient documentation

## 2013-02-19 DIAGNOSIS — Z01818 Encounter for other preprocedural examination: Secondary | ICD-10-CM | POA: Insufficient documentation

## 2013-02-19 LAB — CBC WITH DIFFERENTIAL/PLATELET
Basophils Absolute: 0.1 10*3/uL (ref 0.0–0.1)
Basophils Relative: 1 % (ref 0–1)
Eosinophils Absolute: 0.4 10*3/uL (ref 0.0–0.7)
MCH: 28.2 pg (ref 26.0–34.0)
MCHC: 33.3 g/dL (ref 30.0–36.0)
MCV: 84.6 fL (ref 78.0–100.0)
Neutrophils Relative %: 63 % (ref 43–77)
Platelets: 168 10*3/uL (ref 150–400)
RBC: 4.36 MIL/uL (ref 4.22–5.81)
RDW: 14.7 % (ref 11.5–15.5)

## 2013-02-19 LAB — URINALYSIS, ROUTINE W REFLEX MICROSCOPIC
Bilirubin Urine: NEGATIVE
Glucose, UA: NEGATIVE mg/dL
Specific Gravity, Urine: 1.02 (ref 1.005–1.030)
Urobilinogen, UA: 0.2 mg/dL (ref 0.0–1.0)
pH: 5 (ref 5.0–8.0)

## 2013-02-19 LAB — COMPREHENSIVE METABOLIC PANEL
ALT: 12 U/L (ref 0–53)
AST: 19 U/L (ref 0–37)
Albumin: 3.7 g/dL (ref 3.5–5.2)
Alkaline Phosphatase: 51 U/L (ref 39–117)
Potassium: 4.3 mEq/L (ref 3.5–5.1)
Sodium: 142 mEq/L (ref 135–145)
Total Protein: 8.1 g/dL (ref 6.0–8.3)

## 2013-02-19 LAB — URINE MICROSCOPIC-ADD ON

## 2013-02-19 LAB — PROTIME-INR: Prothrombin Time: 12.2 seconds (ref 11.6–15.2)

## 2013-02-19 NOTE — Progress Notes (Signed)
02/19/13 1019  OBSTRUCTIVE SLEEP APNEA  Have you ever been diagnosed with sleep apnea through a sleep study? No  Do you snore loudly (loud enough to be heard through closed doors)?  0  Do you often feel tired, fatigued, or sleepy during the daytime? 0  Has anyone observed you stop breathing during your sleep? 0  Do you have, or are you being treated for high blood pressure? 1  BMI more than 35 kg/m2? 0  Age over 77 years old? 1  Neck circumference greater than 40 cm/18 inches? 1 (18.5)  Gender: 1  Obstructive Sleep Apnea Score 4  Score 4 or greater  Results sent to PCP

## 2013-02-20 NOTE — Progress Notes (Signed)
Anesthesia Chart Review:  Patient is a 79 year old male scheduled for left shoulder arthroscopic mini rotator cuff repair, possible graft jacket, left index trigger finger release on 02/27/13 by Dr. Durward Fortes.    History includes former smoker, HLD, HTN, CAD s/p CABG '01 with DES to SVG-RCA and angioplasty to ostial PDA 01/03/12, mild AS by 12/2011 echo, CKD, GERD, RA with associated interstitial lung disease, hiatal hernia, chronic diastolic CHF, RA, DM2, cholecystectomy, TKR, cataract extraction. PCP is Dr. Silvio Pate.  Pulmonologist is Dr. Chase Caller, last visit 04/2012.  Rheumatologist is Dr. [name]. He is on Imuran. Cardiologist is Dr. Johnsie Cancel, last visit 12/26/12, who cleared patient for surgery with permission to hold Brilinta for 5 days prior to procedure.  LHC 12/31/11: dLM 80%, LAD 100%, circumflex 100%, RCA 100%, proximal SVG-OM 30%, SVG-D1 ok, SVG-RCA 99% distal and 80% ostial PDA distal to graft, LIMA-LAD ok with some left to right collaterals.  PCI 01/03/12: Promus DES to the SVG-RCA and Cutting Balloon angioplasty to the ostial PDA.   Echocardiogram 12/31/11: EF 99991111, grade 1 diastolic dysfunction, mild aortic stenosis, mean gradient 9, mild LAE.   His last nuclear stress test was on 05/07/11 prior to his last PCI in 12/2011.  CXR on 05/04/12 showed: Mild cardiomegaly and CABG changes again noted. There is no evidence of focal airspace disease, pulmonary edema, suspicious pulmonary nodule/mass, pleural effusion, or pneumothorax. No acute bony abnormalities are identified. No evidence of acute cardiopulmonary disease.  CT of the chest on 05/09/12 showed: 1. Airway thickening in the lower lobes, right greater than left, associated with cylindrical bronchiectasis and air trapping, likely related to chronic inflammation. No fibrosis or honeycombing. No thickening of secondary pulmonary lobular septa.  2. Mild chronic pleural enhancement on the right, most likely due to remote inflammation, and  unchanged from 2009.  Dr. Chase Caller did not feel that it showed much change from prior scan.  Results were not congruent with slightly worse PFTs from 04/28/12 (see Results Review tab), so pulmonary rehab at [facility] was recommended (deconditioning?) as well as cardiology follow-up for further evaluation of dyspnea.  By notes, patient felt dyspnea did improve with pulmonary rehab. No further cardiology testing was ordered from his last two cardiology visits. He did not desaturate with walk test.  Preoperative labs noted.    Patient will be further evaluated by his assigned anesthesiologist on the day of surgery.  If no acute changes in his cardiopulmonary status/symptomology then I would anticipate that he could proceed as planned.  George Hugh [facility] Short Stay Center/Anesthesiology Phone [phone] 02/20/2013 2:57 PM

## 2013-02-24 ENCOUNTER — Encounter (HOSPITAL_COMMUNITY): Payer: Self-pay | Admitting: Emergency Medicine

## 2013-02-24 ENCOUNTER — Observation Stay (HOSPITAL_COMMUNITY)
Admission: EM | Admit: 2013-02-24 | Discharge: 2013-02-25 | Disposition: A | Discharge status: Home / Self Care | Payer: Medicare Other | Attending: Cardiology | Admitting: Cardiology

## 2013-02-24 ENCOUNTER — Emergency Department (HOSPITAL_COMMUNITY): Payer: Medicare Other

## 2013-02-24 DIAGNOSIS — I129 Hypertensive chronic kidney disease with stage 1 through stage 4 chronic kidney disease, or unspecified chronic kidney disease: Secondary | ICD-10-CM | POA: Insufficient documentation

## 2013-02-24 DIAGNOSIS — M069 Rheumatoid arthritis, unspecified: Secondary | ICD-10-CM | POA: Insufficient documentation

## 2013-02-24 DIAGNOSIS — E119 Type 2 diabetes mellitus without complications: Secondary | ICD-10-CM | POA: Insufficient documentation

## 2013-02-24 DIAGNOSIS — R011 Cardiac murmur, unspecified: Secondary | ICD-10-CM | POA: Insufficient documentation

## 2013-02-24 DIAGNOSIS — K219 Gastro-esophageal reflux disease without esophagitis: Secondary | ICD-10-CM | POA: Insufficient documentation

## 2013-02-24 DIAGNOSIS — N183 Chronic kidney disease, stage 3 unspecified: Secondary | ICD-10-CM | POA: Insufficient documentation

## 2013-02-24 DIAGNOSIS — E785 Hyperlipidemia, unspecified: Secondary | ICD-10-CM | POA: Insufficient documentation

## 2013-02-24 DIAGNOSIS — R0602 Shortness of breath: Secondary | ICD-10-CM | POA: Insufficient documentation

## 2013-02-24 DIAGNOSIS — Z888 Allergy status to other drugs, medicaments and biological substances status: Secondary | ICD-10-CM | POA: Insufficient documentation

## 2013-02-24 DIAGNOSIS — G8929 Other chronic pain: Secondary | ICD-10-CM | POA: Insufficient documentation

## 2013-02-24 DIAGNOSIS — I200 Unstable angina: Principal | ICD-10-CM | POA: Insufficient documentation

## 2013-02-24 DIAGNOSIS — Z9889 Other specified postprocedural states: Secondary | ICD-10-CM | POA: Insufficient documentation

## 2013-02-24 DIAGNOSIS — I5032 Chronic diastolic (congestive) heart failure: Secondary | ICD-10-CM

## 2013-02-24 DIAGNOSIS — Z9861 Coronary angioplasty status: Secondary | ICD-10-CM | POA: Insufficient documentation

## 2013-02-24 DIAGNOSIS — Z87891 Personal history of nicotine dependence: Secondary | ICD-10-CM | POA: Insufficient documentation

## 2013-02-24 DIAGNOSIS — Z7982 Long term (current) use of aspirin: Secondary | ICD-10-CM | POA: Insufficient documentation

## 2013-02-24 DIAGNOSIS — I2510 Atherosclerotic heart disease of native coronary artery without angina pectoris: Secondary | ICD-10-CM | POA: Insufficient documentation

## 2013-02-24 DIAGNOSIS — I359 Nonrheumatic aortic valve disorder, unspecified: Secondary | ICD-10-CM

## 2013-02-24 DIAGNOSIS — I252 Old myocardial infarction: Secondary | ICD-10-CM | POA: Insufficient documentation

## 2013-02-24 DIAGNOSIS — E663 Overweight: Secondary | ICD-10-CM | POA: Insufficient documentation

## 2013-02-24 DIAGNOSIS — Z860100 Personal history of colon polyps, unspecified: Secondary | ICD-10-CM | POA: Insufficient documentation

## 2013-02-24 DIAGNOSIS — Z951 Presence of aortocoronary bypass graft: Secondary | ICD-10-CM | POA: Insufficient documentation

## 2013-02-24 DIAGNOSIS — M1990 Unspecified osteoarthritis, unspecified site: Secondary | ICD-10-CM | POA: Insufficient documentation

## 2013-02-24 DIAGNOSIS — I10 Essential (primary) hypertension: Secondary | ICD-10-CM

## 2013-02-24 DIAGNOSIS — J8410 Pulmonary fibrosis, unspecified: Secondary | ICD-10-CM | POA: Insufficient documentation

## 2013-02-24 DIAGNOSIS — Z79899 Other long term (current) drug therapy: Secondary | ICD-10-CM | POA: Insufficient documentation

## 2013-02-24 HISTORY — DX: Acute myocardial infarction, unspecified: I21.9

## 2013-02-24 LAB — CBC
Hemoglobin: 13.5 g/dL (ref 13.0–17.0)
Hemoglobin: 12.9 g/dL — ABNORMAL LOW (ref 13.0–17.0)
MCH: 29.5 pg (ref 26.0–34.0)
MCHC: 34.3 g/dL (ref 30.0–36.0)
Platelets: 162 10*3/uL (ref 150–400)
Platelets: 170 10*3/uL (ref 150–400)
RBC: 4.39 MIL/uL (ref 4.22–5.81)
RDW: 14.7 % (ref 11.5–15.5)
WBC: 7 10*3/uL (ref 4.0–10.5)

## 2013-02-24 LAB — PRO B NATRIURETIC PEPTIDE: Pro B Natriuretic peptide (BNP): 130.5 pg/mL (ref 0–450)

## 2013-02-24 LAB — CREATININE, SERUM
GFR calc Af Amer: 54 mL/min — ABNORMAL LOW (ref >90)
GFR calc non Af Amer: 46 mL/min — ABNORMAL LOW (ref >90)

## 2013-02-24 LAB — BASIC METABOLIC PANEL
Creatinine, Ser: 1.59 mg/dL — ABNORMAL HIGH (ref 0.50–1.35)
GFR calc Af Amer: 46 mL/min — ABNORMAL LOW (ref >90)
GFR calc non Af Amer: 40 mL/min — ABNORMAL LOW (ref >90)
Glucose, Bld: 139 mg/dL — ABNORMAL HIGH (ref 70–99)
Potassium: 4.1 mEq/L (ref 3.5–5.1)
Sodium: 136 mEq/L (ref 135–145)

## 2013-02-24 LAB — HEMOGLOBIN A1C: Mean Plasma Glucose: 151 mg/dL — ABNORMAL HIGH (ref <117)

## 2013-02-24 LAB — POCT I-STAT TROPONIN I: Troponin i, poc: 0 ng/mL (ref 0.00–0.08)

## 2013-02-24 LAB — TROPONIN I: Troponin I: <0.3 ng/mL (ref <0.30)

## 2013-02-24 MED ORDER — ASPIRIN EC 81 MG PO TBEC
81.0000 mg | DELAYED_RELEASE_TABLET | Freq: Every day | ORAL | Status: DC
  Administered 2013-02-24: 81 mg via ORAL
  Filled 2013-02-24 (×2): qty 1

## 2013-02-24 MED ORDER — GABAPENTIN 300 MG PO CAPS
900.0000 mg | ORAL_CAPSULE | Freq: Every morning | ORAL | Status: DC
  Administered 2013-02-24 – 2013-02-25 (×2): 900 mg via ORAL
  Filled 2013-02-24 (×2): qty 3

## 2013-02-24 MED ORDER — OCUVITE-LUTEIN PO CAPS
1.0000 | ORAL_CAPSULE | Freq: Every day | ORAL | Status: DC
  Administered 2013-02-24: 1 via ORAL
  Filled 2013-02-24 (×2): qty 1

## 2013-02-24 MED ORDER — CARVEDILOL 12.5 MG PO TABS
12.5000 mg | ORAL_TABLET | Freq: Two times a day (BID) | ORAL | Status: DC
  Administered 2013-02-24 – 2013-02-25 (×2): 12.5 mg via ORAL
  Filled 2013-02-24 (×4): qty 1

## 2013-02-24 MED ORDER — AMLODIPINE BESYLATE 10 MG PO TABS
10.0000 mg | ORAL_TABLET | Freq: Every day | ORAL | Status: DC
  Administered 2013-02-24 – 2013-02-25 (×2): 10 mg via ORAL
  Filled 2013-02-24 (×2): qty 1

## 2013-02-24 MED ORDER — PANTOPRAZOLE SODIUM 40 MG PO TBEC
40.0000 mg | DELAYED_RELEASE_TABLET | Freq: Every day | ORAL | Status: DC
  Administered 2013-02-24 – 2013-02-25 (×2): 40 mg via ORAL

## 2013-02-24 MED ORDER — NITROGLYCERIN 0.4 MG SL SUBL: 0.4000 mg | SUBLINGUAL_TABLET | SUBLINGUAL | Status: DC | PRN

## 2013-02-24 MED ORDER — ACETAMINOPHEN 325 MG PO TABS
650.0000 mg | ORAL_TABLET | Freq: Once | ORAL | Status: AC
  Administered 2013-02-24: 650 mg via ORAL
  Filled 2013-02-24: qty 2

## 2013-02-24 MED ORDER — ONDANSETRON HCL 4 MG/2ML IJ SOLN: 4.0000 mg | Freq: Four times a day (QID) | INTRAMUSCULAR | Status: DC | PRN

## 2013-02-24 MED ORDER — AZATHIOPRINE 50 MG PO TABS
50.0000 mg | ORAL_TABLET | Freq: Two times a day (BID) | ORAL | Status: DC
Start: 2013-02-24 — End: 2013-02-25
  Administered 2013-02-24 – 2013-02-25 (×3): 50 mg via ORAL
  Filled 2013-02-24 (×5): qty 1

## 2013-02-24 MED ORDER — ENOXAPARIN SODIUM 40 MG/0.4ML ~~LOC~~ SOLN
40.0000 mg | SUBCUTANEOUS | Status: DC
  Administered 2013-02-24: 40 mg via SUBCUTANEOUS
  Filled 2013-02-24 (×2): qty 0.4

## 2013-02-24 MED ORDER — ASPIRIN 81 MG PO CHEW
324.0000 mg | CHEWABLE_TABLET | Freq: Once | ORAL | Status: AC
  Administered 2013-02-24: 324 mg via ORAL
  Filled 2013-02-24: qty 4

## 2013-02-24 MED ORDER — OMEPRAZOLE MAGNESIUM 20 MG PO TBEC: 20.0000 mg | DELAYED_RELEASE_TABLET | Freq: Every day | ORAL | Status: DC

## 2013-02-24 MED ORDER — ACETAMINOPHEN 325 MG PO TABS: 650.0000 mg | ORAL_TABLET | ORAL | Status: DC | PRN

## 2013-02-24 MED ORDER — GABAPENTIN 400 MG PO CAPS
1500.0000 mg | ORAL_CAPSULE | Freq: Every day | ORAL | Status: DC
  Administered 2013-02-24: 22:00:00 1500 mg via ORAL
  Filled 2013-02-24 (×2): qty 1

## 2013-02-24 MED ORDER — LOSARTAN POTASSIUM 50 MG PO TABS
100.0000 mg | ORAL_TABLET | Freq: Every day | ORAL | Status: DC
  Administered 2013-02-24 – 2013-02-25 (×2): 100 mg via ORAL
  Filled 2013-02-24 (×2): qty 2

## 2013-02-24 MED ORDER — VITAMIN B-12 1000 MCG PO TABS
1000.0000 ug | ORAL_TABLET | Freq: Every day | ORAL | Status: DC
  Administered 2013-02-24: 1000 ug via ORAL
  Filled 2013-02-24 (×2): qty 1

## 2013-02-24 MED ORDER — NORTRIPTYLINE HCL 25 MG PO CAPS
25.0000 mg | ORAL_CAPSULE | Freq: Every day | ORAL | Status: DC
  Administered 2013-02-24: 50 mg via ORAL
  Filled 2013-02-24 (×2): qty 2

## 2013-02-24 MED ORDER — ISOSORBIDE MONONITRATE ER 30 MG PO TB24
30.0000 mg | ORAL_TABLET | Freq: Every day | ORAL | Status: DC
  Administered 2013-02-24 – 2013-02-25 (×2): 30 mg via ORAL
  Filled 2013-02-24 (×2): qty 1

## 2013-02-24 MED ORDER — ATORVASTATIN CALCIUM 80 MG PO TABS
80.0000 mg | ORAL_TABLET | Freq: Every day | ORAL | Status: DC
  Administered 2013-02-24: 80 mg via ORAL
  Filled 2013-02-24 (×2): qty 1

## 2013-02-24 MED ORDER — NITROGLYCERIN 0.4 MG SL SUBL
0.4000 mg | SUBLINGUAL_TABLET | SUBLINGUAL | Status: DC | PRN
  Administered 2013-02-24: 0.4 mg via SUBLINGUAL

## 2013-02-24 NOTE — Progress Notes (Signed)
Agree with plan established by Dr. Tommi Rumps who admitted him at 610 this morning.  I will however add isosorbide 30 mg to his drug regimen. He did take 1 nitroglycerin earlier this morning.  Recent, reassuring nuclear stress test overall low risk.  He is supposed to have shoulder surgery on Tuesday. He did not seem honestly too eager to have this done.  Troponin thus far is reassuring. Obviously no signs of acute coronary syndrome were noted. If abnormal, may need to postpone surgery.   We will continue to monitor today.  Remember, he is currently holding his Brilinta in anticipation of Tuesday's shoulder surgery.

## 2013-02-24 NOTE — H&P (Addendum)
Patient ID: [name] MRN: [id number], DOB/AGE: [id number]   Admit date: 02/24/2013   Primary Physician: [name] Simpler, MD Primary Cardiologist: [name] Rouge, MD  Pt. Profile:  5M with CAD, s/p CABG and PCI 12/2011, interstitial lung disease, mild aortic stenosis, HTN, HL, DM2, CKD, rheumatoid arthritis who presents with CP x 2 in setting of holding ticagrelor prior to elective orthopedic surgery.   Problem List  Past Medical History  Diagnosis Date  . Hyperlipidemia   . Hypertension   . GERD (gastroesophageal reflux disease)   . Arthritis     osteoarthritis, s/p R TKR, and digits  . Overweight (BMI 25.0-29.9)     BMI 29  . Interstitial lung disease     NOS  . Pulmonary infiltrates 12-09    stable, 7/10: ? due to GERD  . Diverticulosis   . Hiatal hernia   . Esophageal stricture     s/p dilation spring 2010  . CAD (coronary artery disease)     symptoms followed lopressor Jan-Jul 09 and lisinopril increase July 09, 1/09 Myoview- non ischemic with an EF of 51% and inferobasal wall scar, 10/09 Cath- severe native three-vessel coronary artery disease, s/p multivessel coronary bypass graft with all grafts, Normal LV sys fn with normal LV filling pressures; 10/04-Adenosine cardiolite- thinning wo ischemia, EF 55%; 1/08 Cardiolite neg  . History of PFTs     mixed pattern on spiro. mild restn on lung volumes with near normal DLCO. Pattern can be explained by CABG scar. Fev1 2.2L/73%, ratio 68 (67), TLC 4.7/68%,RV 1.5L/55%,DLCO 79%  . Colon polyps   . Chronic kidney disease, stage III (moderate)   . Chronic diastolic CHF (congestive heart failure)     a) 09/13 ECHO- LVEF 99991111, grade 1 diastolic dysfunction, mild LA dilatation, atrial septal aneurysm, AV mobility restricted, but no sig AS by doppler; b) 09/04/08 ECHO- LVH, ef 60%, mild AS,  . Heart murmur   . Anginal pain   . Dyspnea 2009 since July -Sept    05/06/08-CPST-  normal effort, reduced VO2 max 20.5 /65%, reduced at  8.2/ 40%, normal breathing resetvca of 55%, submaximal heart rate response 112/77%, flattened o2 pluse response at peak exercise-12 ml/beat @ 85%, No VQ mismatch abnormalities, All c/w CIRC Limitation  . Type II diabetes mellitus   . Chronic lower back pain   . RA (rheumatoid arthritis)     [name]    Past Surgical History  Procedure Laterality Date  . Total knee arthroplasty  03/2010    Right- [name] [name]  . Cholecystectomy  11/2003    [name]- open  . Knee arthroscopy  2008    right  . Cataract extraction w/ intraocular lens  implant, bilateral    . Coronary artery bypass graft  11/1999    CABG X5  . Cardiac catheterization  5/06    CP- no MI, Cath- small vessell disease  . Cardiac catheterization  12/31/2011    80% distal LM, 100% native LAD, LCx and RCA, 30% prox SVG-OM, SVG-D1 normal, 99% distal, 80% ostial SVG-RCA distal to graft, LIMA-LAD normal; LVEF mildly decreased with posterior basal AK  . Ptca  01/03/2012    Successful DES to SVG-RCA and cutting balloon angioplasty ostial  PDA  . Eye surgery    . Coronary stent placement       Allergies  Allergies  Allergen Reactions  . Doxazosin Mesylate Other (See Comments)    REACTION: dizzy  . Methocarbamol Rash    "don't  remember how bad"    HPI  CAD, s/p CABG, interstitial lung disease, mild aortic stenosis, HTN, HL, DM2, CKD, rheumatoid arthritis who presents with chest pain.   [name] reports that around 10pm this evening while trying to fall asleep he developed 8/10 SSCP without radiation or associated sx. Pain felt it did prior to PCI in 12/2011, but notes it was different in character compared to his pre-bypass CP. He tried to take antacids for the pain and it did not work. (He notes he had a brief episode 1d PTA that seemed to improve with tums).   After the CP persisted for hours he sought medical attention.    He was given 324mg  ASA and a SL NTG. He reports SL NTG relieved the pain within 1-2 minutes (and led  to a headache.) Of note, he stopped his ticagrelor on 11/20 in anticipation of a shoulder surgery early next week. His baseline dyspnea is unchanged.   Recent cath/PCIs [facility] 12/31/11: dLM 80%, LAD 100%, circumflex 100%, RCA 100%, proximal SVG-OM 30%, SVG-D1 ok, SVG-RCA 99% distal and 80% ostial PDA distal to graft, LIMA-LAD ok with some left to right collaterals.  PCI 01/03/12: Promus DES to the SVG-RCA and Cutting Balloon angioplasty to the ostial PDA.  Echocardiogram 12/31/11: EF 99991111, grade 1 diastolic dysfunction, mild aortic stenosis, mean gradient 9, mild LAE.    Home Medications  Prior to Admission medications   Medication Sig Start Date End Date Taking? Authorizing Provider  amLODipine (NORVASC) 10 MG tablet Take 10 mg by mouth daily.   Yes Historical Provider, MD  aspirin 81 MG tablet Take 81 mg by mouth daily.     Yes Historical Provider, MD  azaTHIOprine (IMURAN) 50 MG tablet Take 50 mg by mouth 2 (two) times daily.   Yes Historical Provider, MD  carvedilol (COREG) 12.5 MG tablet Take 12.5 mg by mouth 2 (two) times daily with a meal.   Yes Historical Provider, MD  gabapentin (NEURONTIN) 300 MG capsule Take 3 tablets by mouth in the morning and 4-5 tablets at bedtime 10/03/12  Yes [name], MD  losartan (COZAAR) 100 MG tablet Take 100 mg by mouth daily.   Yes Historical Provider, MD  metFORMIN (GLUCOPHAGE) 1000 MG tablet Take 1,000 mg by mouth 2 (two) times daily with a meal.   Yes Historical Provider, MD  Multiple Vitamins-Minerals (ICAPS) CAPS Take 1 capsule by mouth daily.    Yes Historical Provider, MD  nitroGLYCERIN (NITROSTAT) 0.4 MG SL tablet Place 1 tablet (0.4 mg total) under the tongue every 5 (five) minutes as needed. Chest pain 12/26/12  Yes [name], MD  nortriptyline (PAMELOR) 25 MG capsule Take 1-2 capsules (25-50 mg total) by mouth at bedtime. 12/13/12  Yes [name], MD  omeprazole (PRILOSEC OTC) 20 MG tablet Take 20 mg by mouth daily.     Yes  Historical Provider, MD  pravastatin (PRAVACHOL) 40 MG tablet Take 40 mg by mouth daily.   Yes Historical Provider, MD  Ticagrelor (BRILINTA) 90 MG TABS tablet Take 90 mg by mouth 2 (two) times daily.   Yes Historical Provider, MD  vitamin B-12 (CYANOCOBALAMIN) 1000 MCG tablet Take 1,000 mcg by mouth daily.   Yes Historical Provider, MD    Family History  Family History  Problem Relation Age of Onset  . COPD Mother   . Heart disease Father   . Diabetes Brother   . Colon cancer Brother   . Alcohol abuse Sister  Social History  History   Social History  . Marital Status: Married    Spouse Name: N/A    Number of Children: N/A  . Years of Education: N/A   Occupational History  . lawn mower     retired   Social History Main Topics  . Smoking status: Former Smoker -- 1.00 packs/day for 20 years    Types: Cigarettes    Quit date: 04/06/1963  . Smokeless tobacco: Never Used  . Alcohol Use: No     Comment: 01/01/2012 "last alcohol ~ 50 yr ago"  . Drug Use: No  . Sexual Activity: Not Currently   Other Topics Concern  . Not on file   Social History Narrative   No living will   Requests wife as health care POA   Discussed DNR --he requests this (done 08/29/12)   Not sure about feeding tube---but might accept for some time     Review of Systems General:  No chills, fever Cardiovascular:  Chest pain per HPI, stable chronic dyspnea. No edema, orthopnea, palpitations Dermatological: No rash, lesions/masses Respiratory: stable chropnic dyspnea Abdominal:   No nausea, vomiting, diarrhea, bright red blood per rectum, melena, or hematemesis Neurologic:  No visual changes, wkns, changes in mental status. All other systems reviewed and are otherwise negative except as noted above.  Physical Exam  Blood pressure 136/71, pulse 62, temperature 97.7 F (36.5 C), resp. rate 18, SpO2 96.00%.  General: Pleasant, NAD Psych: Normal affect. Neuro: Alert and oriented X 3. Moves all  extremities spontaneously. HEENT: Normal  Neck: Supple without bruits or JVD. Lungs:  Resp regular and unlabored, crackles 1/2 bilaterally (baseline per patient)  Heart: RRR no s3, s4, mid peaking systolic murmur with entirely preserved A2. . Abdomen: Soft, non-tender, non-distended, BS + x 4.  Extremities: No clubbing, cyanosis or edema. DP/PT/femoral/Radials 2+ and equal bilaterally.  Labs  Troponin [facility] Test)  Recent Labs  02/24/13 0404  TROPIPOC 0.00   No results found for this basename: CKTOTAL, CKMB, TROPONINI,  in the last 72 hours Lab Results  Component Value Date   WBC 7.6 02/24/2013   HGB 13.5 02/24/2013   HCT 39.4 02/24/2013   MCV 86.0 02/24/2013   PLT 162 02/24/2013     Recent Labs Lab 02/19/13 1038 02/24/13 0351  NA 142 136  K 4.3 4.1  CL 103 99  CO2 27 26  BUN 20 23  CREATININE 1.40* 1.59*  CALCIUM 10.0 9.8  PROT 8.1  --   BILITOT 0.4  --   ALKPHOS 51  --   ALT 12  --   AST 19  --   GLUCOSE 146* 139*   Lab Results  Component Value Date   CHOL 132 01/01/2012   HDL 32* 01/01/2012   LDLCALC 69 01/01/2012   TRIG 154* 01/01/2012   No results found for this basename: DDIMER     Radiology/Studies  Dg Chest 2 View  02/24/2013   CLINICAL DATA:  Chest pain  EXAM: CHEST  2 VIEW  COMPARISON:  05/04/2012  FINDINGS: No cardiomegaly. Status post CABG. Mildly hyperinflated lungs with chronic bronchial wall thickening. Minimal scar or atelectasis at the peripheral right base. Known bronchiectasis at the right base. No infiltrate, edema, effusion, or pneumothorax.  IMPRESSION: 1. No active cardiopulmonary disease. 2. Chronic bronchitic changes.   Electronically Signed   By: Jorje Guild M.D.   On: 02/24/2013 04:39    ECG 05/03/12: NSR, LAD 02/24/13: NSR, LAD (unchanged from prior)  05/06/11 Lexiscan: low risk stress nuclear study. Small inferobasal wall scar with no ischemia  12/31/11 TTE - Left ventricle: The cavity size was normal. Wall  thickness was normal. Systolic function was normal. The estimated ejection fraction was in the range of 50% to 55%. Doppler parameters are consistent with abnormal left ventricular relaxation (grade 1 diastolic dysfunction). - Aortic valve: Valve mobility was restricted. Valve area: 1.61cm^2(VTI). Valve area: 1.88cm^2 (Vmax). - Left atrium: The atrium was mildly dilated. 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  ASSESSMENT AND PLAN 74M with CAD s/p CABG and PCI, CKD, ILD who presents with nitro responsive CP that is fairly typical for his ischemic pain. Biomarkers negative and ECG reassuring. However, he is high risk.   1. Chest pain. Although pain did come on while lying flat (potentially suggestive GERD) it was nitro responsive and c/w prior CP episodes in setting of ischemia - Admit for rule out and stress if TnI are negative - continue ASA, ARB, coreg - switch statin to atorva 80mg  - Will hold ticagrelor for now (so long as biomarkers remain negative) so as to hold hold up  - cycle trops     Signed, Lamar Sprinkles, MD 02/24/2013, 5:19 AML

## 2013-02-24 NOTE — Progress Notes (Signed)
Pt had 5 beat run vtach, pt asymptomatic, md aware, pt on BB, strip is in chart. Will continue to monitor.

## 2013-02-24 NOTE — ED Notes (Signed)
Pt denies any Cp now but stated some head.

## 2013-02-24 NOTE — ED Notes (Signed)
Old and new EKG given to Dr Winfred Leeds

## 2013-02-24 NOTE — ED Provider Notes (Signed)
CSN: [id number]     Arrival date & time 02/24/13  [id number] History   First MD Initiated Contact with Patient 02/24/13 0406     Chief Complaint  Patient presents with  . Chest Pain   (Consider location/radiation/quality/duration/timing/severity/associated sxs/prior Treatment) Patient is a 79 y.o. male presenting with chest pain.  Chest Pain Associated symptoms: shortness of breath    Point of anterior chest pain pressure-like, nonradiating slight amount yesterday morning. Return again tonight 10 PM while at rest symptoms accompanied by shortness of breath. No nausea no sweatiness. No treatment prior to coming here. Feels like "heart pain" he's had in the past. Pain is not present. No other associated symptoms. Nothing makes symptoms better or worse. Past Medical History  Diagnosis Date  . Hyperlipidemia   . Hypertension   . GERD (gastroesophageal reflux disease)   . Arthritis     osteoarthritis, s/p R TKR, and digits  . Overweight (BMI 25.0-29.9)     BMI 29  . Interstitial lung disease     NOS  . Pulmonary infiltrates 12-09    stable, 7/10: ? due to GERD  . Diverticulosis   . Hiatal hernia   . Esophageal stricture     s/p dilation spring 2010  . CAD (coronary artery disease)     symptoms followed lopressor Jan-Jul 09 and lisinopril increase July 09, 1/09 Myoview- non ischemic with an EF of 51% and inferobasal wall scar, 10/09 Cath- severe native three-vessel coronary artery disease, s/p multivessel coronary bypass graft with all grafts, Normal LV sys fn with normal LV filling pressures; 10/04-Adenosine cardiolite- thinning wo ischemia, EF 55%; 1/08 Cardiolite neg  . History of PFTs     mixed pattern on spiro. mild restn on lung volumes with near normal DLCO. Pattern can be explained by CABG scar. Fev1 2.2L/73%, ratio 68 (67), TLC 4.7/68%,RV 1.5L/55%,DLCO 79%  . Colon polyps   . Chronic kidney disease, stage III (moderate)   . Chronic diastolic CHF (congestive heart failure)     a)  09/13 ECHO- LVEF 99991111, grade 1 diastolic dysfunction, mild LA dilatation, atrial septal aneurysm, AV mobility restricted, but no sig AS by doppler; b) 09/04/08 ECHO- LVH, ef 60%, mild AS,  . Heart murmur   . Anginal pain   . Dyspnea 2009 since July -Sept    05/06/08-CPST-  normal effort, reduced VO2 max 20.5 /65%, reduced at 8.2/ 40%, normal breathing resetvca of 55%, submaximal heart rate response 112/77%, flattened o2 pluse response at peak exercise-12 ml/beat @ 85%, No VQ mismatch abnormalities, All c/w CIRC Limitation  . Type II diabetes mellitus   . Chronic lower back pain   . RA (rheumatoid arthritis)     [name]   Past Surgical History  Procedure Laterality Date  . Total knee arthroplasty  03/2010    Right- [name]  . Cholecystectomy  11/2003    [name]- open  . Knee arthroscopy  2008    right  . Cataract extraction w/ intraocular lens  implant, bilateral    . Coronary artery bypass graft  11/1999    CABG X5  . Cardiac catheterization  5/06    CP- no MI, Cath- small vessell disease  . Cardiac catheterization  12/31/2011    80% distal LM, 100% native LAD, LCx and RCA, 30% prox SVG-OM, SVG-D1 normal, 99% distal, 80% ostial SVG-RCA distal to graft, LIMA-LAD normal; LVEF mildly decreased with posterior basal AK  . Ptca  01/03/2012    Successful DES to SVG-RCA and  cutting balloon angioplasty ostial  PDA  . Eye surgery    . Coronary stent placement     Family History  Problem Relation Age of Onset  . COPD Mother   . Heart disease Father   . Diabetes Brother   . Colon cancer Brother   . Alcohol abuse Sister    History  Substance Use Topics  . Smoking status: Former Smoker -- 1.00 packs/day for 20 years    Types: Cigarettes    Quit date: 04/06/1963  . Smokeless tobacco: Never Used  . Alcohol Use: No     Comment: 01/01/2012 "last alcohol ~ 50 yr ago"    Review of Systems  Constitutional: Negative.   HENT: Negative.   Respiratory: Positive for shortness of breath.    Cardiovascular: Positive for chest pain.  Gastrointestinal: Negative.   Musculoskeletal: Negative.   Skin: Negative.   Neurological: Negative.   Psychiatric/Behavioral: Negative.   All other systems reviewed and are negative.    Allergies  Doxazosin mesylate and Methocarbamol  Home Medications   Current Outpatient Rx  Name  Route  Sig  Dispense  Refill  . amLODipine (NORVASC) 10 MG tablet      TAKE 1 TABLET (10 MG TOTAL) BY MOUTH DAILY.   30 tablet   10   . aspirin 81 MG tablet   Oral   Take 81 mg by mouth daily.           [name] azaTHIOprine (IMURAN) 50 MG tablet   Oral   Take 50 mg by mouth 2 (two) times daily.         . carvedilol (COREG) 12.5 MG tablet   Oral   Take 12.5 mg by mouth 2 (two) times daily with a meal.         . gabapentin (NEURONTIN) 300 MG capsule      Take 3 tablets by mouth in the morning and 4-5 tablets at bedtime         . losartan (COZAAR) 100 MG tablet      TAKE 1 TABLET (100 MG TOTAL) BY MOUTH DAILY.   30 tablet   10   . metFORMIN (GLUCOPHAGE) 1000 MG tablet      TAKE 1 TABLET TWICE A DAY WITH MEAL   60 tablet   11   . Multiple Vitamins-Minerals (ICAPS) CAPS   Oral   Take by mouth daily.          . nitroGLYCERIN (NITROSTAT) 0.4 MG SL tablet   Sublingual   Place 1 tablet (0.4 mg total) under the tongue every 5 (five) minutes as needed. Chest pain   25 tablet   4   . nortriptyline (PAMELOR) 25 MG capsule   Oral   Take 1-2 capsules (25-50 mg total) by mouth at bedtime.   60 capsule   11   . omeprazole (PRILOSEC OTC) 20 MG tablet   Oral   Take 20 mg by mouth daily.           . pravastatin (PRAVACHOL) 40 MG tablet      TAKE 1 TABLET EVERY DAY   30 tablet   11   . Ticagrelor (BRILINTA) 90 MG TABS tablet      TAKE 1 TABLET (90 MG TOTAL) BY MOUTH 2 (TWO) TIMES DAILY.         . vitamin B-12 (CYANOCOBALAMIN) 1000 MCG tablet   Oral   Take 1,000 mcg by mouth daily.  BP 167/84  Resp 21  SpO2  97% Physical Exam  Nursing note and vitals reviewed. Constitutional: He appears well-developed and well-nourished.  HENT:  Head: Normocephalic and atraumatic.  Eyes: Conjunctivae are normal. Pupils are equal, round, and reactive to light.  Neck: Neck supple. No tracheal deviation present. No thyromegaly present.  Cardiovascular: Normal rate and regular rhythm.   No murmur heard. Pulmonary/Chest: Effort normal and breath sounds normal.  Abdominal: Soft. Bowel sounds are normal. He exhibits no distension. There is no tenderness.  Musculoskeletal: Normal range of motion. He exhibits no edema and no tenderness.  Neurological: He is alert. Coordination normal.  Skin: Skin is warm and dry. No rash noted.  Psychiatric: He has a normal mood and affect.    ED Course  Procedures (including critical care time) Labs Review Labs Reviewed  CBC  BASIC METABOLIC PANEL  PRO B NATRIURETIC PEPTIDE   Imaging Review No results found.  EKG Interpretation    Date/Time:  Saturday February 24 2013 03:46:20 EST Ventricular Rate:  64 PR Interval:  170 QRS Duration: 98 QT Interval:  416 QTC Calculation: 429 R Axis:   -31 Text Interpretation:  Normal sinus rhythm Left axis deviation Abnormal ECG Confirmed by Efton Thomley  MD, Corbyn Wildey ([id number]) on 02/24/2013 4:18:27 AM            5:20 AM patient chest pain-free after treatment with sublingual nitroglycerin aspirin. Complains of headache since nitroglycerin administered. Tylenol ordered. Chest x-ray reviewed by me Results for orders placed during the hospital encounter of 02/24/13  CBC      Result Value Range   WBC 7.6  4.0 - 10.5 K/uL   RBC 4.58  4.22 - 5.81 MIL/uL   Hemoglobin 13.5  13.0 - 17.0 g/dL   HCT 39.4  39.0 - 52.0 %   MCV 86.0  78.0 - 100.0 fL   MCH 29.5  26.0 - 34.0 pg   MCHC 34.3  30.0 - 36.0 g/dL   RDW 14.8  11.5 - 15.5 %   Platelets 162  150 - 400 K/uL  BASIC METABOLIC PANEL      Result Value Range   Sodium 136  135 - 145 mEq/L    Potassium 4.1  3.5 - 5.1 mEq/L   Chloride 99  96 - 112 mEq/L   CO2 26  19 - 32 mEq/L   Glucose, Bld 139 (*) 70 - 99 mg/dL   BUN 23  6 - 23 mg/dL   Creatinine, Ser 1.59 (*) 0.50 - 1.35 mg/dL   Calcium 9.8  8.4 - 10.5 mg/dL   GFR calc non Af Amer 40 (*) >90 mL/min   GFR calc Af Amer 46 (*) >90 mL/min  PRO B NATRIURETIC PEPTIDE      Result Value Range   Pro B Natriuretic peptide (BNP) 130.5  0 - 450 pg/mL  POCT I-STAT TROPONIN I      Result Value Range   Troponin i, poc 0.00  0.00 - 0.08 ng/mL   Comment 3            Dg Chest 2 View  02/24/2013   CLINICAL DATA:  Chest pain  EXAM: CHEST  2 VIEW  COMPARISON:  05/04/2012  FINDINGS: No cardiomegaly. Status post CABG. Mildly hyperinflated lungs with chronic bronchial wall thickening. Minimal scar or atelectasis at the peripheral right base. Known bronchiectasis at the right base. No infiltrate, edema, effusion, or pneumothorax.  IMPRESSION: 1. No active cardiopulmonary disease. 2. Chronic bronchitic changes.  Electronically Signed   By: Jorje Guild M.D.   On: 02/24/2013 04:39    MDM  No diagnosis found. Dr Tommi Rumps, cardiology consulted for admission. Dx #1unstable angina #2 renal insufficiency #3 hyperglycemia    Orlie Dakin, MD 02/24/13 [phone]

## 2013-02-24 NOTE — ED Notes (Signed)
Pt. reports mid chest pain with SOB onset yesterday worse this evening , denies nausea or diaphoresis , pt. stated history of CAD / CABG / STENT , his cardiologist is Dr. Johnsie Cancel .

## 2013-02-25 DIAGNOSIS — I359 Nonrheumatic aortic valve disorder, unspecified: Secondary | ICD-10-CM

## 2013-02-25 DIAGNOSIS — I5032 Chronic diastolic (congestive) heart failure: Secondary | ICD-10-CM

## 2013-02-25 DIAGNOSIS — I2510 Atherosclerotic heart disease of native coronary artery without angina pectoris: Secondary | ICD-10-CM

## 2013-02-25 DIAGNOSIS — I10 Essential (primary) hypertension: Secondary | ICD-10-CM

## 2013-02-25 LAB — LIPID PANEL
LDL Cholesterol: 76 mg/dL (ref 0–99)
VLDL: 38 mg/dL (ref 0–40)

## 2013-02-25 MED ORDER — ISOSORBIDE MONONITRATE ER 30 MG PO TB24: 30.0000 mg | ORAL_TABLET | Freq: Every day | ORAL | Status: DC

## 2013-02-25 NOTE — Discharge Summary (Addendum)
Physician Discharge Summary  Patient ID: [name] MRN: [id number] DOB/AGE: 77/28/1933 77 y.o.  Admit date: 02/24/2013 Discharge date: 02/25/2013  Admission Diagnoses: Possible unstable angina  Discharge Diagnoses:  Chest pain Coronary artery disease Mild aortic stenosis Interstitial lung disease Hypertension Hyperlipidemia Rheumatoid arthritis  Discharged Condition: good  Hospital Course: 79 year old male with coronary artery disease status post bypass surgery, PCI in September of 2013 with interstitial lung disease, mild aortic stenosis, hypertension, hyperlipidemia, diabetes, chronic kidney disease, rheumatoid arthritis who presented with chest discomfort, 2 separate bouts in the setting of holding ticagrelor for shoulder surgery planned on Tuesday.  He is quite anxious about the shoulder surgery and was actually hoping that I would state that he would not be able to go forward with shoulder surgery. Unfortunately, or fortunately, history prominence were all normal, EKG unremarkable and he remained chest pain-free. I feel comfortable allowing him to proceed with upcoming surgery.  I did initiate isosorbide 30 mg once a day.  I will keep him on all other home medications including pravastatin. One could consider changed to atorvastatin as outpatient.  Troponin/EKG unremarkable.  In doing well, family at bedside. They also expressed that he has anxiety at times.  Discharge Exam: Blood pressure 150/60, pulse 62, temperature 98.1 F (36.7 C), temperature source Oral, resp. rate 18, height 6' 0.84" (1.85 m), weight 218 lb (98.884 kg), SpO2 92.00%.  General: Alert and oriented x3 in no acute distress Cardiovascular: Regular rate and rhythm, no murmurs rubs or gallops (telemetry unremarkable except for short, 5 beat run of wide-complex tachycardia, asymptomatic-on metoprolol. Lungs: Clear to auscultation bilaterally Abdomen: Soft, nontender, normal bowel sounds Extremities:  No clubbing cyanosis or edema.  Disposition: 01-Home or Self Care  Discharge Orders   Future Appointments Provider Department Dept Phone   03/06/2013 10:15 AM [name], MD [place] at [facility] [phone]   Future Orders Complete By Expires   Diet - low sodium heart healthy  As directed    Increase activity slowly  As directed        Medication List         amLODipine 10 MG tablet  Commonly known as:  NORVASC  Take 10 mg by mouth daily.     aspirin 81 MG tablet  Take 81 mg by mouth daily.     azaTHIOprine 50 MG tablet  Commonly known as:  IMURAN  Take 50 mg by mouth 2 (two) times daily.     carvedilol 12.5 MG tablet  Commonly known as:  COREG  Take 12.5 mg by mouth 2 (two) times daily with a meal.     gabapentin 300 MG capsule  Commonly known as:  NEURONTIN  Take 3 tablets by mouth in the morning and 4-5 tablets at bedtime     ICAPS Caps  Take 1 capsule by mouth daily.     isosorbide mononitrate 30 MG 24 hr tablet  Commonly known as:  IMDUR  Take 1 tablet (30 mg total) by mouth daily.     losartan 100 MG tablet  Commonly known as:  COZAAR  Take 100 mg by mouth daily.     metFORMIN 1000 MG tablet  Commonly known as:  GLUCOPHAGE  Take 1,000 mg by mouth 2 (two) times daily with a meal.     nitroGLYCERIN 0.4 MG SL tablet  Commonly known as:  NITROSTAT  Place 1 tablet (0.4 mg total) under the tongue every 5 (five) minutes as needed. Chest pain  nortriptyline 25 MG capsule  Commonly known as:  PAMELOR  Take 1-2 capsules (25-50 mg total) by mouth at bedtime.     omeprazole 20 MG tablet  Commonly known as:  PRILOSEC OTC  Take 20 mg by mouth daily.     pravastatin 40 MG tablet  Commonly known as:  PRAVACHOL  Take 40 mg by mouth daily.     Ticagrelor 90 MG Tabs tablet  Commonly known as:  BRILINTA  Take 90 mg by mouth 2 (two) times daily.     vitamin B-12 1000 MCG tablet  Commonly known as:  CYANOCOBALAMIN  Take 1,000 mcg by mouth  daily.        Continue to hold ticagrelor for upcoming surgery on Tuesday.  If symptoms worsen or become more worrisome, please see the medical attention.  Once again, recent reassuring nuclear stress test which was low risk.  Continue with followup as planned with Dr. Johnsie Cancel.  SignedCandee Furbish 02/25/2013, 12:03 PM

## 2013-02-26 MED ORDER — CEFAZOLIN SODIUM 10 G IJ SOLR
3.0000 g | INTRAMUSCULAR | Status: AC
  Administered 2013-02-27: 3 g via INTRAVENOUS
  Filled 2013-02-26: qty 3000

## 2013-02-26 MED ORDER — CHLORHEXIDINE GLUCONATE 4 % EX LIQD: 60.0000 mL | Freq: Once | CUTANEOUS | Status: DC

## 2013-02-26 NOTE — Progress Notes (Signed)
Message left on pts voice mail re: time change.  Instructed pt to return call to verify receipt of message.

## 2013-02-26 NOTE — Progress Notes (Signed)
Spoke with Mrs. [name] and verified and confirmed new surgery time.  They will be here at 11:15am..Marland KitchenDA

## 2013-02-27 ENCOUNTER — Encounter (HOSPITAL_COMMUNITY)
Admission: RE | Disposition: A | Discharge status: Home / Self Care | Payer: Self-pay | Source: Ambulatory Visit | Attending: Orthopaedic Surgery

## 2013-02-27 ENCOUNTER — Ambulatory Visit (HOSPITAL_COMMUNITY): Payer: Medicare Other | Admitting: Anesthesiology

## 2013-02-27 ENCOUNTER — Encounter (HOSPITAL_COMMUNITY): Payer: Self-pay | Admitting: *Deleted

## 2013-02-27 ENCOUNTER — Ambulatory Visit (HOSPITAL_COMMUNITY)
Admission: RE | Admit: 2013-02-27 | Discharge: 2013-02-27 | Disposition: A | Discharge status: Home / Self Care | Payer: Medicare Other | Source: Ambulatory Visit | Attending: Orthopaedic Surgery | Admitting: Orthopaedic Surgery

## 2013-02-27 ENCOUNTER — Encounter (HOSPITAL_COMMUNITY): Payer: Medicare Other | Admitting: Vascular Surgery

## 2013-02-27 DIAGNOSIS — M19019 Primary osteoarthritis, unspecified shoulder: Secondary | ICD-10-CM | POA: Insufficient documentation

## 2013-02-27 DIAGNOSIS — M6530 Trigger finger, unspecified finger: Secondary | ICD-10-CM | POA: Insufficient documentation

## 2013-02-27 DIAGNOSIS — G4733 Obstructive sleep apnea (adult) (pediatric): Secondary | ICD-10-CM | POA: Insufficient documentation

## 2013-02-27 DIAGNOSIS — I129 Hypertensive chronic kidney disease with stage 1 through stage 4 chronic kidney disease, or unspecified chronic kidney disease: Secondary | ICD-10-CM | POA: Insufficient documentation

## 2013-02-27 DIAGNOSIS — M719 Bursopathy, unspecified: Secondary | ICD-10-CM | POA: Insufficient documentation

## 2013-02-27 DIAGNOSIS — Z79899 Other long term (current) drug therapy: Secondary | ICD-10-CM | POA: Insufficient documentation

## 2013-02-27 DIAGNOSIS — M9420 Chondromalacia, unspecified site: Secondary | ICD-10-CM | POA: Insufficient documentation

## 2013-02-27 DIAGNOSIS — I5032 Chronic diastolic (congestive) heart failure: Secondary | ICD-10-CM | POA: Insufficient documentation

## 2013-02-27 DIAGNOSIS — Z7982 Long term (current) use of aspirin: Secondary | ICD-10-CM | POA: Insufficient documentation

## 2013-02-27 DIAGNOSIS — K219 Gastro-esophageal reflux disease without esophagitis: Secondary | ICD-10-CM | POA: Insufficient documentation

## 2013-02-27 DIAGNOSIS — E785 Hyperlipidemia, unspecified: Secondary | ICD-10-CM | POA: Insufficient documentation

## 2013-02-27 DIAGNOSIS — M7500 Adhesive capsulitis of unspecified shoulder: Secondary | ICD-10-CM | POA: Diagnosis present

## 2013-02-27 DIAGNOSIS — N138 Other obstructive and reflux uropathy: Secondary | ICD-10-CM | POA: Insufficient documentation

## 2013-02-27 DIAGNOSIS — N183 Chronic kidney disease, stage 3 unspecified: Secondary | ICD-10-CM | POA: Insufficient documentation

## 2013-02-27 DIAGNOSIS — N401 Enlarged prostate with lower urinary tract symptoms: Secondary | ICD-10-CM | POA: Insufficient documentation

## 2013-02-27 DIAGNOSIS — Z87891 Personal history of nicotine dependence: Secondary | ICD-10-CM | POA: Insufficient documentation

## 2013-02-27 DIAGNOSIS — M75102 Unspecified rotator cuff tear or rupture of left shoulder, not specified as traumatic: Secondary | ICD-10-CM | POA: Diagnosis present

## 2013-02-27 DIAGNOSIS — I252 Old myocardial infarction: Secondary | ICD-10-CM | POA: Insufficient documentation

## 2013-02-27 DIAGNOSIS — M67919 Unspecified disorder of synovium and tendon, unspecified shoulder: Secondary | ICD-10-CM | POA: Insufficient documentation

## 2013-02-27 DIAGNOSIS — I2510 Atherosclerotic heart disease of native coronary artery without angina pectoris: Secondary | ICD-10-CM | POA: Insufficient documentation

## 2013-02-27 DIAGNOSIS — E119 Type 2 diabetes mellitus without complications: Secondary | ICD-10-CM | POA: Insufficient documentation

## 2013-02-27 DIAGNOSIS — M7542 Impingement syndrome of left shoulder: Secondary | ICD-10-CM | POA: Diagnosis present

## 2013-02-27 HISTORY — PX: TRIGGER FINGER RELEASE: SHX641

## 2013-02-27 HISTORY — PX: SHOULDER ARTHROSCOPY WITH OPEN ROTATOR CUFF REPAIR AND DISTAL CLAVICLE ACROMINECTOMY: SHX5683

## 2013-02-27 LAB — GLUCOSE, CAPILLARY
Glucose-Capillary: 132 mg/dL — ABNORMAL HIGH (ref 70–99)
Glucose-Capillary: 156 mg/dL — ABNORMAL HIGH (ref 70–99)

## 2013-02-27 SURGERY — SHOULDER ARTHROSCOPY WITH OPEN ROTATOR CUFF REPAIR AND DISTAL CLAVICLE ACROMINECTOMY
Anesthesia: General | Site: Shoulder | Laterality: Left | Wound class: Clean

## 2013-02-27 MED ORDER — MIDAZOLAM HCL 2 MG/2ML IJ SOLN
INTRAMUSCULAR | Status: AC
  Filled 2013-02-27: qty 2

## 2013-02-27 MED ORDER — LIDOCAINE HCL (CARDIAC) 20 MG/ML IV SOLN
INTRAVENOUS | Status: DC | PRN
  Administered 2013-02-27: 40 mg via INTRAVENOUS

## 2013-02-27 MED ORDER — ROCURONIUM BROMIDE 100 MG/10ML IV SOLN
INTRAVENOUS | Status: DC | PRN
  Administered 2013-02-27: 50 mg via INTRAVENOUS

## 2013-02-27 MED ORDER — BUPIVACAINE HCL 0.25 % IJ SOLN
INTRAMUSCULAR | Status: DC | PRN
  Administered 2013-02-27: 4 mL

## 2013-02-27 MED ORDER — ALBUMIN HUMAN 5 % IV SOLN
INTRAVENOUS | Status: DC | PRN
  Administered 2013-02-27: 15:00:00 via INTRAVENOUS

## 2013-02-27 MED ORDER — ONDANSETRON HCL 4 MG/2ML IJ SOLN
INTRAMUSCULAR | Status: DC | PRN
  Administered 2013-02-27: 4 mg via INTRAVENOUS

## 2013-02-27 MED ORDER — FENTANYL CITRATE 0.05 MG/ML IJ SOLN
INTRAMUSCULAR | Status: AC
  Filled 2013-02-27: qty 2

## 2013-02-27 MED ORDER — BUPIVACAINE HCL (PF) 0.25 % IJ SOLN
INTRAMUSCULAR | Status: AC
  Filled 2013-02-27: qty 30

## 2013-02-27 MED ORDER — ONDANSETRON HCL 4 MG/2ML IJ SOLN: 4.0000 mg | Freq: Once | INTRAMUSCULAR | Status: DC | PRN

## 2013-02-27 MED ORDER — NEOSTIGMINE METHYLSULFATE 1 MG/ML IJ SOLN
INTRAMUSCULAR | Status: DC | PRN
  Administered 2013-02-27: 2 mg via INTRAVENOUS

## 2013-02-27 MED ORDER — PROPOFOL 10 MG/ML IV BOLUS
INTRAVENOUS | Status: DC | PRN
  Administered 2013-02-27: 100 mg via INTRAVENOUS

## 2013-02-27 MED ORDER — HYDROMORPHONE HCL PF 1 MG/ML IJ SOLN: 0.2500 mg | INTRAMUSCULAR | Status: DC | PRN

## 2013-02-27 MED ORDER — EPHEDRINE SULFATE 50 MG/ML IJ SOLN
INTRAMUSCULAR | Status: DC | PRN
  Administered 2013-02-27 (×5): 10 mg via INTRAVENOUS

## 2013-02-27 MED ORDER — LACTATED RINGERS IV SOLN
INTRAVENOUS | Status: DC | PRN
  Administered 2013-02-27 (×2): via INTRAVENOUS

## 2013-02-27 MED ORDER — FENTANYL CITRATE 0.05 MG/ML IJ SOLN
INTRAMUSCULAR | Status: DC | PRN
  Administered 2013-02-27: 50 ug via INTRAVENOUS

## 2013-02-27 MED ORDER — OXYCODONE-ACETAMINOPHEN 5-325 MG PO TABS: 1.0000 | ORAL_TABLET | ORAL | Status: DC | PRN

## 2013-02-27 MED ORDER — GLYCOPYRROLATE 0.2 MG/ML IJ SOLN
INTRAMUSCULAR | Status: DC | PRN
  Administered 2013-02-27: 0.4 mg via INTRAVENOUS

## 2013-02-27 MED ORDER — SODIUM CHLORIDE 0.9 % IR SOLN
Status: DC | PRN
  Administered 2013-02-27: 9000 mL

## 2013-02-27 SURGICAL SUPPLY — 55 items
BANDAGE ELASTIC 4 VELCRO ST LF (GAUZE/BANDAGES/DRESSINGS) ×3 IMPLANT
BANDAGE GAUZE ELAST BULKY 4 IN (GAUZE/BANDAGES/DRESSINGS) ×3 IMPLANT
BENZOIN TINCTURE PRP APPL 2/3 (GAUZE/BANDAGES/DRESSINGS) ×3 IMPLANT
BLADE GREAT WHITE 4.2 (BLADE) ×3 IMPLANT
BLADE SURG 11 STRL SS (BLADE) ×3 IMPLANT
BNDG ESMARK 4X9 LF (GAUZE/BANDAGES/DRESSINGS) ×3 IMPLANT
BUR OVAL 6.0 (BURR) ×3 IMPLANT
CANNULA ACUFLEX KIT 5X76 (CANNULA) ×3 IMPLANT
CLOTH BEACON ORANGE TIMEOUT ST (SAFETY) IMPLANT
CORDS BIPOLAR (ELECTRODE) ×3 IMPLANT
COVER SURGICAL LIGHT HANDLE (MISCELLANEOUS) ×3 IMPLANT
DRAPE INCISE IOBAN 66X45 STRL (DRAPES) ×3 IMPLANT
DRAPE SHOULDER BEACH CHAIR (DRAPES) ×3 IMPLANT
DRSG ADAPTIC 3X8 NADH LF (GAUZE/BANDAGES/DRESSINGS) ×3 IMPLANT
DRSG EMULSION OIL 3X3 NADH (GAUZE/BANDAGES/DRESSINGS) ×3 IMPLANT
DRSG PAD ABDOMINAL 8X10 ST (GAUZE/BANDAGES/DRESSINGS) ×3 IMPLANT
DURAPREP 26ML APPLICATOR (WOUND CARE) ×3 IMPLANT
ELECT REM PT RETURN 9FT ADLT (ELECTROSURGICAL) ×3
ELECTRODE REM PT RTRN 9FT ADLT (ELECTROSURGICAL) ×2 IMPLANT
GLOVE BIOGEL PI IND STRL 8 (GLOVE) ×2 IMPLANT
GLOVE BIOGEL PI IND STRL 8.5 (GLOVE) IMPLANT
GLOVE BIOGEL PI INDICATOR 8 (GLOVE) ×1
GLOVE BIOGEL PI INDICATOR 8.5 (GLOVE)
GLOVE ECLIPSE 8.0 STRL XLNG CF (GLOVE) ×3 IMPLANT
GLOVE SURG ORTHO 8.5 STRL (GLOVE) ×6 IMPLANT
GOWN PREVENTION PLUS XXLARGE (GOWN DISPOSABLE) ×3 IMPLANT
GOWN STRL NON-REIN LRG LVL3 (GOWN DISPOSABLE) ×6 IMPLANT
KIT BASIN OR (CUSTOM PROCEDURE TRAY) ×3 IMPLANT
KIT ROOM TURNOVER OR (KITS) ×3 IMPLANT
MANIFOLD NEPTUNE II (INSTRUMENTS) ×3 IMPLANT
NEEDLE 22X1 1/2 (OR ONLY) (NEEDLE) ×3 IMPLANT
NEEDLE HYPO 25GX1X1/2 BEV (NEEDLE) ×3 IMPLANT
NEEDLE SPNL 18GX3.5 QUINCKE PK (NEEDLE) ×3 IMPLANT
NS IRRIG 1000ML POUR BTL (IV SOLUTION) ×3 IMPLANT
PACK ORTHO EXTREMITY (CUSTOM PROCEDURE TRAY) ×3 IMPLANT
PACK SHOULDER (CUSTOM PROCEDURE TRAY) ×3 IMPLANT
PAD ARMBOARD 7.5X6 YLW CONV (MISCELLANEOUS) ×6 IMPLANT
PEEK ANCHOR ALLTHREAD 5.5M SZ2 (Anchor) ×3 IMPLANT
SET ARTHROSCOPY TUBING (MISCELLANEOUS) ×1
SET ARTHROSCOPY TUBING LN (MISCELLANEOUS) ×2 IMPLANT
SPONGE GAUZE 4X4 12PLY (GAUZE/BANDAGES/DRESSINGS) ×3 IMPLANT
STRIP CLOSURE SKIN 1/2X4 (GAUZE/BANDAGES/DRESSINGS) ×3 IMPLANT
SUCTION FRAZIER TIP 10 FR DISP (SUCTIONS) ×3 IMPLANT
SUT ETHILON 4 0 PS 2 18 (SUTURE) ×3 IMPLANT
SUT MNCRL AB 3-0 PS2 18 (SUTURE) ×3 IMPLANT
SUT VIC AB 0 CT1 27 (SUTURE) ×1
SUT VIC AB 0 CT1 27XBRD ANBCTR (SUTURE) ×2 IMPLANT
SUT VICRYL 0 UR6 27IN ABS (SUTURE) ×3 IMPLANT
SYR CONTROL 10ML LL (SYRINGE) IMPLANT
TOWEL OR 17X24 6PK STRL BLUE (TOWEL DISPOSABLE) ×3 IMPLANT
TOWEL OR 17X26 10 PK STRL BLUE (TOWEL DISPOSABLE) ×3 IMPLANT
TUBE CONNECTING 12X1/4 (SUCTIONS) ×6 IMPLANT
UNDERPAD 30X30 INCONTINENT (UNDERPADS AND DIAPERS) IMPLANT
WAND 90 DEG TURBOVAC W/CORD (SURGICAL WAND) ×3 IMPLANT
WATER STERILE IRR 1000ML POUR (IV SOLUTION) ×3 IMPLANT

## 2013-02-27 NOTE — Anesthesia Preprocedure Evaluation (Addendum)
Anesthesia Evaluation  Patient identified by MRN, date of birth, ID band Patient awake    Reviewed: Allergy & Precautions, H&P , NPO status , Patient's Chart, lab work & pertinent test results  Airway Mallampati: II TM Distance: >3 FB Neck ROM: Full    Dental  (+) Dental Advisory Given and Edentulous Upper   Pulmonary former smoker,  breath sounds clear to auscultation        Cardiovascular hypertension, Rhythm:Regular Rate:Normal     Neuro/Psych    GI/Hepatic   Endo/Other    Renal/GU      Musculoskeletal   Abdominal   Peds  Hematology   Anesthesia Other Findings   Reproductive/Obstetrics                          Anesthesia Physical Anesthesia Plan  ASA: III  Anesthesia Plan: General   Post-op Pain Management:    Induction: Intravenous  Airway Management Planned: Oral ETT  Additional Equipment:   Intra-op Plan:   Post-operative Plan: Extubation in OR  Informed Consent: I have reviewed the patients History and Physical, chart, labs and discussed the procedure including the risks, benefits and alternatives for the proposed anesthesia with the patient or authorized representative who has indicated his/her understanding and acceptance.   Dental advisory given  Plan Discussed with: CRNA and Anesthesiologist  Anesthesia Plan Comments: (Impingement L. Shoulder  CAD S/P CABG, PTCA  H/O Pulmonary fibrosis mild by PFTs Type 2 DM glucose 132 Htn Renal insufficiency Cr. 1.40  Plan GA with oral ETT, OK for interscalene block.  Roberts Gaudy, MD)       Anesthesia Quick Evaluation

## 2013-02-27 NOTE — Op Note (Signed)
PATIENT ID:      [name]  MRN:     [id number] DOB/AGE:    Jan 04, 1932 / 79 y.o.       OPERATIVE REPORT    DATE OF PROCEDURE:  02/27/2013       PREOPERATIVE DIAGNOSIS:   ROTATOR CUFF TEAR LEFT SHOULDER WITH IMPINGEMENT AND DJD A-C JOINT LEFT INDEX TRIGGER FINGER                                                       Estimated body mass index is 29.02 kg/(m^2) as calculated from the following:   Height as of 02/24/13: 6' 0.83" (1.85 m).   Weight as of 12/26/12: 99.338 kg (219 lb).     POSTOPERATIVE DIAGNOSIS:  SAME                                                                    Estimated body mass index is 29.02 kg/(m^2) as calculated from the following:   Height as of 02/24/13: 6' 0.83" (1.85 m).   Weight as of 12/26/12: 99.338 kg (219 lb).     PROCEDURE:  Procedure(s): LEFT SHOULDER ARTHROSCOPY WITH MINI OPEN ROTATOR CUFF REPAIR AND SUBACROMIAL DECOMPRESSION AND DISTAL CLAVICLE RESECTION RELEASE TRIGGER FINGER/A-1 PULLEY left     SURGEON:  Joni Fears, MD    ASSISTANT:   Biagio Borg, PA-C   (Present and scrubbed throughout the case, critical for assistance with exposure, retraction, instrumentation, and closure.)          ANESTHESIA: local, regional and general     DRAINS: none :      TOURNIQUET TIME: * No tourniquets in log *    COMPLICATIONS:  None   CONDITION:  stable  PROCEDURE IN [id number]   [name] W 02/27/2013, 3:58 PM

## 2013-02-27 NOTE — Transfer of Care (Signed)
Immediate Anesthesia Transfer of Care Note  Patient: [name]  Procedure(s) Performed: Procedure(s): LEFT SHOULDER ARTHROSCOPY WITH MINI OPEN ROTATOR CUFF REPAIR AND SUBACROMIAL DECOMPRESSION AND DISTAL CLAVICLE RESECTION (Left) RELEASE TRIGGER FINGER/A-1 PULLEY (Left)  Patient Location: PACU  Anesthesia Type:General  Level of Consciousness: awake, alert  and oriented  Airway & Oxygen Therapy: Patient Spontanous Breathing and Patient connected to nasal cannula oxygen  Post-op Assessment: Report given to PACU RN and Post -op Vital signs reviewed and stable  Post vital signs: Reviewed and stable  Complications: No apparent anesthesia complications

## 2013-02-27 NOTE — Anesthesia Postprocedure Evaluation (Signed)
Anesthesia Post Note  Patient: [name]  Procedure(s) Performed: Procedure(s) (LRB): LEFT SHOULDER ARTHROSCOPY WITH MINI OPEN ROTATOR CUFF REPAIR AND SUBACROMIAL DECOMPRESSION AND DISTAL CLAVICLE RESECTION (Left) RELEASE TRIGGER FINGER/A-1 PULLEY (Left)  Anesthesia type: general  Patient location: PACU  Post pain: Pain level controlled  Post assessment: Patient's Cardiovascular Status Stable  Last Vitals:  Filed Vitals:   02/27/13 1645  BP: 143/63  Pulse: 63  Temp:   Resp: 17    Post vital signs: Reviewed and stable  Level of consciousness: sedated  Complications: No apparent anesthesia complications

## 2013-02-27 NOTE — H&P (Signed)
  The recent History & Physical has been reviewed. I have personally examined the patient today. There is no interval change to the documented History & Physical. The patient would like to proceed with the procedure.  Joni Fears W 02/27/2013,  1:42 PM

## 2013-02-27 NOTE — Anesthesia Procedure Notes (Signed)
Anesthesia Regional Block:  Interscalene brachial plexus block  Pre-Anesthetic Checklist: ,, timeout performed, Correct Patient, Correct Site, Correct Laterality, Correct Procedure, Correct Position, site marked, Risks and benefits discussed,  Surgical consent,  Pre-op evaluation,  At surgeon's request and post-op pain management  Laterality: Left  Prep: chloraprep       Needles:  Injection technique: Single-shot  Needle Type: Echogenic Stimulator Needle     Needle Length:cm 9 cm Needle Gauge: 22 and 22 G    Additional Needles:  Procedures: ultrasound guided (picture in chart) and nerve stimulator Interscalene brachial plexus block Narrative:  Start time: 02/27/2013 12:30 PM End time: 02/27/2013 12:35 PM Injection made incrementally with aspirations every 5 mL.  Performed by: Personally   Additional Notes: 25 cc 0.5% marcaine with 1:200 epi and 5 mg decadron injected easily.

## 2013-02-28 ENCOUNTER — Encounter (HOSPITAL_COMMUNITY): Payer: Self-pay | Admitting: Orthopaedic Surgery

## 2013-02-28 NOTE — Op Note (Signed)
NAMEJEREMAI, RUSSIN               ACCOUNT NO.:  [id number]  MEDICAL RECORD NO.:  [id number]  LOCATION:  [facility]                         FACILITY:  [facility]  PHYSICIAN:  Vonna Kotyk. Salah Nakamura, M.D.DATE OF BIRTH:  10/10/1931  DATE OF PROCEDURE:  02/27/2013 DATE OF DISCHARGE:  02/27/2013                              OPERATIVE REPORT   PREOPERATIVE DIAGNOSES: 1. Rotator cuff tear, left shoulder with impingement. 2. Degenerative joint disease, acromioclavicular joint, left shoulder. 3. Left index trigger finger.  POSTOPERATIVE DIAGNOSES: 1. Rotator cuff tear, left shoulder with impingement. 2. Degenerative joint disease, acromioclavicular joint, left shoulder. 3. Left index trigger finger.  PROCEDURE: 1. Examination of left shoulder under anesthesia with manipulation. 2. Diagnostic arthroscopy, left shoulder with debridement of labral     tearing. 3. Arthroscopic subacromial decompression. 4. Arthroscopic distal clavicle resection. 5. Release of A1 pulley, left index finger.  SURGEON:  Vonna Kotyk. Durward Fortes, M.D.  ASSISTANT:  Aaron Edelman D. Petrarca, PA-C, who was present throughout the operative procedure to ensure its timely completion.  ANESTHESIA:  Interscalene nerve block, left upper extremity per anesthesia.  General per anesthesia, local 2% Xylocaine without epinephrine to a left index finger.  COMPLICATIONS:  None.  HISTORY:  A 79 year old gentleman has been experiencing left shoulder pain since January 2014.  He was subsequently diagnosed with a rotator cuff tear, specifically involving the supraspinatus per MRI scan.  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  He also developed triggering of a left index finger, wishes to proceed with repair of the rotator cuff tear, as well as release of a left index trigger finger.  DESCRIPTION OF PROCEDURE:  Mr. Bolash was met in the holding area with his family, identified the  left hand and left shoulder as the appropriate operative site.  He was then transported to room #3 and placed under general anesthesia without difficulty.  He did receive a preoperative interscalene nerve block per anesthesia.  The patient was placed in a semi-sitting position after induction of anesthesia.  Examination of a left shoulder revealed evidence of adhesive capsulitis.  He was only allowed about 130 degrees of overhead motion, about 30 degrees of external rotation.  Gentle manipulation was performed with lysis of adhesions.  I could not quite get the arm fully overhead but it was felt that I was nearly up to 150 or 60 degrees and about 45-50 degrees of external rotation, and over 90 degrees of abduction.  The left upper extremity was then prepped with DuraPrep from the base of the neck circumferentially and tips of the fingers.  Sterile draping was performed.  Initial procedure was performed in a left shoulder.  I marked the AC joint, coracoid, acromion with a marking pencil.  At a point, a fingerbreadth posterior and medial to the posterior angle acromion, a small stab wound was made.  The arthroscope was easily placed into the shoulder joint.  Diagnostic arthroscopy revealed some hemarthrosis related to the manipulation.  There was some fraying of the anterior and inferior labrum.  These were debrided with a Cuda shaver.  There was some chondromalacia of the glenoid, very  little of the humeral head but did not require shaving.  There were grade 2 changes.  I did not see any loose material.  The biceps tendon appeared to be intact.  The rotator cuff appeared to be torn.  There were still some fibers of the supraspinatus.  Infraspinatus looked intact.  There was some partial tearing of the subscapularis.  At that point, I have performed a subacromial decompression.  The arthroscope placed in the subacromial space posteriorly, the cannula in subacromial space anteriorly, and  a third portal was established in lateral subacromial space.  The arthroscopic subacromial decompression was performed removing abundant bursal tissue with a Cuda shaver and the ArthroCare wand.  There was considerable anterior to some extent lateral overhang of the acromion. The anterior, inferior, and lateral acromioplasty were performed with a 6-mm bur.  There was also considerable degenerative change at the [facility] joint, with prominence of the distal clavicle and synovitis at the [facility] joint.  Distal clavicle resection was used implying the same 6 mm bur with the nice resection.  I could visualize the rotator cuff tear to the bursal surface, and then I proceeded with a mini open rotator cuff tear repair.  About an inch incision was made over the anterior aspect of the shoulder via sharp dissection and carried down to the subcutaneous tissue.  Soft tissue was debrided so that I could find a raphe in the deltoid fascia.  This was incised.  Subacromial space was entered.  It was then irrigated.  The rotator cuff was easily identified and it was chronic involving the supraspinatus.  The edges were rounded and retracted.  There was also a large humeral head spur which was resected down to bleeding bone.  I then repaired the rotator cuff tear using a single peek 5.5 mm anchor with 2 attached FiberWire.  The sutures were then applied to the rotator cuff, applying it to bleeding bone and then performing a 2nd row repair through bone in the proximal humerus.  We had nice repair under no tension.  There was no evidence of any impingement.  The wound was then irrigated with saline solution.  A raphe in the fascia was closed with a running 0 Vicryl, subcu with 3-0 Monocryl. Skin was then closed with Steri-Strips over benzoin.  A left hand was then exposed through the sterile draping and skin incision was outlined along the distal flexion crease overlying the index finger.  An Esmarch was then  applied for hemostasis.  About a half to 3/4 inch incision was then made along the flexion crease and via sharp dissection, carried down the subcutaneous tissue.  By blunt dissection, the soft tissue was elevated from the flexor tendon sheath. With good visualization, I made a small longitudinal incision in the sheath.  I found the area that was constricting the tendon along the A1 pulley proximally and incised it proximally and distally.  At that point, we could no longer trigger the finger.  The wound was irrigated with saline solution.  The skin was closed with interrupted 4-0 Ethilon. I infiltrated the skin with 2% Xylocaine without epinephrine.  An Esmarch was removed.  There was immediate capillary refill to the fingers.  Sterile bulky dressing was applied followed by a Coban.  Bulky dressing was applied to the left shoulder followed by a sling.  The patient was awoke and returned to the postanesthesia recovery in satisfactory condition.  As long as he is stable in recovery room, plan to discharge him  in a sling and follow up as an outpatient.     Vonna Kotyk. Durward Fortes, M.D.     PWW/MEDQ  D:  02/27/2013  T:  02/28/2013  Job:  [id number]

## 2013-03-02 ENCOUNTER — Other Ambulatory Visit: Payer: Self-pay | Admitting: Internal Medicine

## 2013-03-05 ENCOUNTER — Ambulatory Visit: Payer: Medicare Other | Admitting: Internal Medicine

## 2013-03-06 ENCOUNTER — Encounter: Payer: Self-pay | Admitting: Internal Medicine

## 2013-03-06 ENCOUNTER — Ambulatory Visit (INDEPENDENT_AMBULATORY_CARE_PROVIDER_SITE_OTHER): Payer: Medicare Other | Admitting: Internal Medicine

## 2013-03-06 VITALS — BP 150/76 | HR 76 | Temp 98.0°F | Wt 213.5 lb

## 2013-03-06 DIAGNOSIS — E1142 Type 2 diabetes mellitus with diabetic polyneuropathy: Secondary | ICD-10-CM

## 2013-03-06 DIAGNOSIS — I2510 Atherosclerotic heart disease of native coronary artery without angina pectoris: Secondary | ICD-10-CM

## 2013-03-06 DIAGNOSIS — J849 Interstitial pulmonary disease, unspecified: Secondary | ICD-10-CM

## 2013-03-06 DIAGNOSIS — E1149 Type 2 diabetes mellitus with other diabetic neurological complication: Secondary | ICD-10-CM

## 2013-03-06 DIAGNOSIS — J8410 Pulmonary fibrosis, unspecified: Secondary | ICD-10-CM

## 2013-03-06 DIAGNOSIS — I5032 Chronic diastolic (congestive) heart failure: Secondary | ICD-10-CM

## 2013-03-06 LAB — HM DIABETES FOOT EXAM

## 2013-03-06 NOTE — Assessment & Plan Note (Signed)
Pain is better back on the nortriptylline Will sign for shoes since at high risk due to absence of sensation

## 2013-03-06 NOTE — Assessment & Plan Note (Signed)
Recent chest pain admission not cardiac Not sure he needs the isosorbide---will check with Dr Johnsie Cancel

## 2013-03-06 NOTE — Progress Notes (Signed)
Subjective:    Patient ID: [name], male    DOB: Feb 07, 1932, 79 y.o.   MRN: [id number]  HPI Here with wife  Just had left rotator cuff repair Release of trigger finger--- 2nd on left Everything went okay Will be starting PT soon  Diabetes okay Checks sugars once a week or so Usually 130 -150 No hypoglycemic reactions Neuropathy pain is better---less burning  Admitted for ?unstable angina No bump in enzymes Doesn't seem to have been cardiac Had been started on isosorbide then No chest pain since this time Hadn't needed his nitro before this hospital stay  Current Outpatient Prescriptions on File Prior to Visit  Medication Sig Dispense Refill  . amLODipine (NORVASC) 10 MG tablet Take 10 mg by mouth daily.      [name] aspirin 81 MG tablet Take 81 mg by mouth daily.        [name] azaTHIOprine (IMURAN) 50 MG tablet Take 50 mg by mouth 2 (two) times daily.      . carvedilol (COREG) 12.5 MG tablet Take 12.5 mg by mouth 2 (two) times daily with a meal.      . gabapentin (NEURONTIN) 300 MG capsule Take 3 tablets by mouth in the morning and 4-5 tablets at bedtime      . isosorbide mononitrate (IMDUR) 30 MG 24 hr tablet Take 1 tablet (30 mg total) by mouth daily.  30 tablet  6  . losartan (COZAAR) 100 MG tablet TAKE 1 TABLET (100 MG TOTAL) BY MOUTH DAILY.  30 tablet  0  . metFORMIN (GLUCOPHAGE) 1000 MG tablet Take 1,000 mg by mouth 2 (two) times daily with a meal.      . Multiple Vitamins-Minerals (ICAPS) CAPS Take 1 capsule by mouth daily.       . nitroGLYCERIN (NITROSTAT) 0.4 MG SL tablet Place 1 tablet (0.4 mg total) under the tongue every 5 (five) minutes as needed. Chest pain  25 tablet  4  . nortriptyline (PAMELOR) 25 MG capsule Take 1-2 capsules (25-50 mg total) by mouth at bedtime.  60 capsule  11  . omeprazole (PRILOSEC OTC) 20 MG tablet Take 20 mg by mouth daily.        [name] oxyCODONE-acetaminophen (ROXICET) 5-325 MG per tablet Take 1-2 tablets by mouth every 4 (four) hours as  needed for severe pain.  30 tablet  0  . pravastatin (PRAVACHOL) 40 MG tablet Take 40 mg by mouth daily.      . Ticagrelor (BRILINTA) 90 MG TABS tablet Take 90 mg by mouth 2 (two) times daily.      . vitamin B-12 (CYANOCOBALAMIN) 1000 MCG tablet Take 1,000 mcg by mouth daily.       No current facility-administered medications on file prior to visit.    Allergies  Allergen Reactions  . Doxazosin Mesylate Other (See Comments)    REACTION: dizzy  . Methocarbamol Rash    "don't remember how bad"    Past Medical History  Diagnosis Date  . Hyperlipidemia   . Hypertension   . GERD (gastroesophageal reflux disease)   . Arthritis     osteoarthritis, s/p R TKR, and digits  . Overweight (BMI 25.0-29.9)     BMI 29  . Interstitial lung disease     NOS  . Pulmonary infiltrates 12-09    stable, 7/10: ? due to GERD  . Diverticulosis   . Esophageal stricture     s/p dilation spring 2010  . CAD (coronary artery disease)  symptoms followed lopressor Jan-Jul 09 and lisinopril increase July 09, 1/09 Myoview- non ischemic with an EF of 51% and inferobasal wall scar, 10/09 Cath- severe native three-vessel coronary artery disease, s/p multivessel coronary bypass graft with all grafts, Normal LV sys fn with normal LV filling pressures; 10/04-Adenosine cardiolite- thinning wo ischemia, EF 55%; 1/08 Cardiolite neg  . History of PFTs     mixed pattern on spiro. mild restn on lung volumes with near normal DLCO. Pattern can be explained by CABG scar. Fev1 2.2L/73%, ratio 68 (67), TLC 4.7/68%,RV 1.5L/55%,DLCO 79%  . Colon polyps   . Chronic kidney disease, stage III (moderate)   . Chronic diastolic CHF (congestive heart failure)     a) 09/13 ECHO- LVEF 99991111, grade 1 diastolic dysfunction, mild LA dilatation, atrial septal aneurysm, AV mobility restricted, but no sig AS by doppler; b) 09/04/08 ECHO- LVH, ef 60%, mild AS,  . Heart murmur   . Type II diabetes mellitus   . Chronic lower back pain   . RA  (rheumatoid arthritis)     [name]  . Myocardial infarction     pci  . Dyspnea 2009 since July -Sept    05/06/08-[facility]-  normal effort, reduced VO2 max 20.5 /65%, reduced at 8.2/ 40%, normal breathing resetvca of 55%, submaximal heart rate response 112/77%, flattened o2 pluse response at peak exercise-12 ml/beat @ 85%, No VQ mismatch abnormalities, All c/w CIRC Limitation  . Hiatal hernia   . Anginal pain 02/24/13    Past Surgical History  Procedure Laterality Date  . Total knee arthroplasty  03/2010    Right- [name]  . Cholecystectomy  11/2003    [name]- open  . Knee arthroscopy  2008    right  . Cataract extraction w/ intraocular lens  implant, bilateral    . Coronary artery bypass graft  11/1999    CABG X5  . Cardiac catheterization  5/06    CP- no MI, Cath- small vessell disease  . Cardiac catheterization  12/31/2011    80% distal LM, 100% native LAD, LCx and RCA, 30% prox SVG-OM, SVG-D1 normal, 99% distal, 80% ostial SVG-RCA distal to graft, LIMA-LAD normal; LVEF mildly decreased with posterior basal AK  . Ptca  01/03/2012    Successful DES to SVG-RCA and cutting balloon angioplasty ostial  PDA  . Eye surgery    . Coronary stent placement    . Shoulder arthroscopy with open rotator cuff repair and distal clavicle acrominectomy Left 02/27/2013    Procedure: LEFT SHOULDER ARTHROSCOPY WITH MINI OPEN ROTATOR CUFF REPAIR AND SUBACROMIAL DECOMPRESSION AND DISTAL CLAVICLE RESECTION;  Surgeon: [name], MD;  Location: [place];  Service: Orthopedics;  Laterality: Left;  . Trigger finger release Left 02/27/2013    Procedure: RELEASE TRIGGER FINGER/A-1 PULLEY;  Surgeon: [name], MD;  Location: [place];  Service: Orthopedics;  Laterality: Left;    Family History  Problem Relation Age of Onset  . COPD Mother   . Heart disease Father   . Diabetes Brother   . Colon cancer Brother   . Alcohol abuse Sister     History   Social History  . Marital Status: Married     Spouse Name: N/A    Number of Children: N/A  . Years of Education: N/A   Occupational History  . lawn mower     retired   Social History Main Topics  . Smoking status: Former Smoker -- 1.00 packs/day for 20 years    Types:  Cigarettes    Quit date: 04/06/1963  . Smokeless tobacco: Never Used  . Alcohol Use: No     Comment: 01/01/2012 "last alcohol ~ 50 yr ago"  . Drug Use: No  . Sexual Activity: Not Currently   Other Topics Concern  . Not on file   Social History Narrative   No living will   Requests wife as health care POA   Discussed DNR --he requests this (done 08/29/12)   Not sure about feeding tube---but might accept for some time   Review of Systems Sleeps okay--- in recliner since the surgery Appetite is good Voids okay    Objective:   Physical Exam  Constitutional: He appears well-developed and well-nourished. No distress.  Neck: Normal range of motion. Neck supple. No thyromegaly present.  Cardiovascular: Normal rate, regular rhythm, normal heart sounds and intact distal pulses.  Exam reveals no gallop.   No murmur heard. Pulmonary/Chest: Effort normal and breath sounds normal. No respiratory distress. He has no wheezes. He has no rales.  Musculoskeletal: He exhibits no edema and no tenderness.  Lymphadenopathy:    He has no cervical adenopathy.  Neurological:  Decreased sensation in feet  Skin: No rash noted.  No foot lesions  Psychiatric: He has a normal mood and affect. His behavior is normal.          Assessment & Plan:

## 2013-03-06 NOTE — Progress Notes (Signed)
Pre-visit discussion using our clinic review tool. No additional management support is needed unless otherwise documented below in the visit note.  

## 2013-03-06 NOTE — Assessment & Plan Note (Signed)
Good control Lab Results  Component Value Date   HGBA1C 6.9* 02/24/2013   Discussed lifestyle

## 2013-03-06 NOTE — Assessment & Plan Note (Signed)
Well compensated No changes needed

## 2013-03-06 NOTE — Assessment & Plan Note (Signed)
Did pulmonary rehab---it helped He had to pay though!!  No crackles on exam today

## 2013-03-12 ENCOUNTER — Ambulatory Visit: Payer: Medicare Other | Attending: Orthopaedic Surgery | Admitting: Physical Therapy

## 2013-03-12 DIAGNOSIS — R293 Abnormal posture: Secondary | ICD-10-CM | POA: Insufficient documentation

## 2013-03-12 DIAGNOSIS — M6281 Muscle weakness (generalized): Secondary | ICD-10-CM | POA: Insufficient documentation

## 2013-03-12 DIAGNOSIS — M25519 Pain in unspecified shoulder: Secondary | ICD-10-CM | POA: Insufficient documentation

## 2013-03-12 DIAGNOSIS — M25619 Stiffness of unspecified shoulder, not elsewhere classified: Secondary | ICD-10-CM | POA: Insufficient documentation

## 2013-03-12 DIAGNOSIS — IMO0001 Reserved for inherently not codable concepts without codable children: Secondary | ICD-10-CM | POA: Insufficient documentation

## 2013-03-14 ENCOUNTER — Telehealth: Payer: Self-pay

## 2013-03-14 NOTE — Telephone Encounter (Signed)
Mrs [name] said pt started 2 days ago with prod cough yellow phlegm,fever,congestion; wants med sent to [place].No CP,SOB or wheezing. Advised needs to be seen; scheduled appt 03/15/13 at 11:15 am. If pt condition changes or worsens prior to appt will go to [facility].

## 2013-03-14 NOTE — Telephone Encounter (Signed)
Agreed -

## 2013-03-15 ENCOUNTER — Ambulatory Visit (INDEPENDENT_AMBULATORY_CARE_PROVIDER_SITE_OTHER): Payer: Medicare Other | Admitting: Family Medicine

## 2013-03-15 ENCOUNTER — Encounter: Payer: Self-pay | Admitting: Family Medicine

## 2013-03-15 VITALS — BP 128/68 | HR 60 | Temp 97.9°F | Wt 212.8 lb

## 2013-03-15 DIAGNOSIS — R05 Cough: Secondary | ICD-10-CM | POA: Insufficient documentation

## 2013-03-15 MED ORDER — DOXYCYCLINE HYCLATE 100 MG PO TABS: 100.0000 mg | ORAL_TABLET | Freq: Two times a day (BID) | ORAL | Status: DC

## 2013-03-15 NOTE — Progress Notes (Signed)
Pre-visit discussion using our clinic review tool. No additional management support is needed unless otherwise documented below in the visit note.  L shoulder surgery hx and imuran noted.    Sx started a few days ago.  Wife is sick.  Noted fatigue, cough, sweats.  Discolored sputum.  Not more SOB at usual.  No ear pain.  ST is mild. No rash. Sugar has been controlled.  Rhinorrhea noted recently.  Chest sx were most notable for patient.  Meds, vitals, and allergies reviewed.   ROS: See HPI.  Otherwise, noncontributory.  GEN: nad, alert and oriented HEENT: mucous membranes moist, tm w/o erythema, nasal exam w/o erythema, clear discharge noted,  OP with cobblestoning NECK: supple w/o LA CV: rrr.  Murmur noted.  PULM: BLL rhonchi but ctab o/w, no inc wob EXT: no edema SKIN: no acute rash L arm in a sling

## 2013-03-15 NOTE — Patient Instructions (Addendum)
Take doxycycline and mucinex. Drink plenty of water and this should gradually improve.  Take care.

## 2013-03-15 NOTE — Assessment & Plan Note (Signed)
Given the rhonchi and the imuran, would treat for bronchitis.  No wheeze.  No inc in wob.  Start doxy.  D/w pt.  F/u prn.  He agrees.

## 2013-03-22 ENCOUNTER — Ambulatory Visit: Payer: Medicare Other | Admitting: Rehabilitation

## 2013-03-26 ENCOUNTER — Ambulatory Visit: Payer: Medicare Other | Admitting: Rehabilitation

## 2013-03-27 ENCOUNTER — Ambulatory Visit: Payer: Medicare Other | Admitting: Rehabilitation

## 2013-03-28 ENCOUNTER — Other Ambulatory Visit: Payer: Self-pay | Admitting: Internal Medicine

## 2013-04-03 ENCOUNTER — Ambulatory Visit: Payer: Medicare Other | Admitting: Rehabilitation

## 2013-04-04 ENCOUNTER — Ambulatory Visit: Payer: Medicare Other | Admitting: Rehabilitation

## 2013-04-09 ENCOUNTER — Ambulatory Visit: Payer: Medicare Other | Attending: Internal Medicine | Admitting: Physical Therapy

## 2013-04-09 DIAGNOSIS — IMO0001 Reserved for inherently not codable concepts without codable children: Secondary | ICD-10-CM | POA: Insufficient documentation

## 2013-04-09 DIAGNOSIS — M25519 Pain in unspecified shoulder: Secondary | ICD-10-CM | POA: Insufficient documentation

## 2013-04-09 DIAGNOSIS — R293 Abnormal posture: Secondary | ICD-10-CM | POA: Insufficient documentation

## 2013-04-09 DIAGNOSIS — M6281 Muscle weakness (generalized): Secondary | ICD-10-CM | POA: Insufficient documentation

## 2013-04-09 DIAGNOSIS — M25619 Stiffness of unspecified shoulder, not elsewhere classified: Secondary | ICD-10-CM | POA: Insufficient documentation

## 2013-04-11 ENCOUNTER — Ambulatory Visit: Payer: Medicare Other | Admitting: Physical Therapy

## 2013-04-16 ENCOUNTER — Ambulatory Visit: Payer: Medicare Other | Admitting: Rehabilitation

## 2013-04-18 ENCOUNTER — Ambulatory Visit: Payer: Medicare Other | Admitting: Rehabilitation

## 2013-04-23 ENCOUNTER — Encounter: Payer: Medicare Other | Admitting: Rehabilitation

## 2013-04-25 ENCOUNTER — Encounter: Payer: Medicare Other | Admitting: Rehabilitation

## 2013-04-30 ENCOUNTER — Ambulatory Visit: Payer: Medicare Other | Admitting: Rehabilitation

## 2013-05-02 ENCOUNTER — Ambulatory Visit: Payer: Medicare Other | Admitting: Physical Therapy

## 2013-05-07 ENCOUNTER — Ambulatory Visit: Payer: Medicare Other | Attending: Internal Medicine | Admitting: Physical Therapy

## 2013-05-07 DIAGNOSIS — M25619 Stiffness of unspecified shoulder, not elsewhere classified: Secondary | ICD-10-CM | POA: Insufficient documentation

## 2013-05-07 DIAGNOSIS — IMO0001 Reserved for inherently not codable concepts without codable children: Secondary | ICD-10-CM | POA: Insufficient documentation

## 2013-05-07 DIAGNOSIS — M25519 Pain in unspecified shoulder: Secondary | ICD-10-CM | POA: Insufficient documentation

## 2013-05-07 DIAGNOSIS — R293 Abnormal posture: Secondary | ICD-10-CM | POA: Insufficient documentation

## 2013-05-07 DIAGNOSIS — M6281 Muscle weakness (generalized): Secondary | ICD-10-CM | POA: Insufficient documentation

## 2013-05-09 ENCOUNTER — Ambulatory Visit: Payer: Medicare Other | Admitting: Rehabilitation

## 2013-05-10 ENCOUNTER — Telehealth: Payer: Self-pay | Admitting: *Deleted

## 2013-05-10 NOTE — Telephone Encounter (Signed)
Noted  

## 2013-05-10 NOTE — Telephone Encounter (Signed)
Received fax from [facility] asking for refill of FUROSEMIDE 20 mg, called pt and advised this wasn't on his current med list and also we haven't filled this since 2013 and per pt he has an appt with Dr. Darnell Level tomorrow 05/11/13 @ 10am and he will discuss with Dr. Darnell Level then.

## 2013-05-11 ENCOUNTER — Ambulatory Visit (INDEPENDENT_AMBULATORY_CARE_PROVIDER_SITE_OTHER): Payer: Medicare Other | Admitting: Family Medicine

## 2013-05-11 ENCOUNTER — Encounter: Payer: Self-pay | Admitting: Family Medicine

## 2013-05-11 ENCOUNTER — Ambulatory Visit (INDEPENDENT_AMBULATORY_CARE_PROVIDER_SITE_OTHER)
Admission: RE | Admit: 2013-05-11 | Discharge: 2013-05-11 | Disposition: A | Discharge status: Home / Self Care | Payer: Medicare Other | Source: Ambulatory Visit | Attending: Family Medicine | Admitting: Family Medicine

## 2013-05-11 VITALS — BP 148/74 | HR 64 | Temp 97.8°F | Wt 227.5 lb

## 2013-05-11 DIAGNOSIS — R0600 Dyspnea, unspecified: Secondary | ICD-10-CM

## 2013-05-11 DIAGNOSIS — R0989 Other specified symptoms and signs involving the circulatory and respiratory systems: Secondary | ICD-10-CM

## 2013-05-11 DIAGNOSIS — R0609 Other forms of dyspnea: Secondary | ICD-10-CM

## 2013-05-11 MED ORDER — FUROSEMIDE 20 MG PO TABS: 20.0000 mg | ORAL_TABLET | Freq: Every day | ORAL | Status: DC | PRN

## 2013-05-11 MED ORDER — FUROSEMIDE 20 MG PO TABS: 20.0000 mg | ORAL_TABLET | Freq: Every day | ORAL | Status: DC

## 2013-05-11 NOTE — Progress Notes (Signed)
BP 148/74  Pulse 64  Temp(Src) 97.8 F (36.6 C) (Oral)  Wt 227 lb 8 oz (103.193 kg)  SpO2 95%   CC: leg swelling  Subjective:    Patient ID: [name], male    DOB: 12-27-1931, 80 y.o.   MRN: [id number]  HPI: [name] is a 80 y.o. male presenting on 05/11/2013 with Edema  Pleasant 80 yo patient of Dr. Alla German presents today with several week h/o R>L swelling of legs and feet.  H/o R knee replacement 2005.  Denies change in diet or salt content.  No new medications or changed medications.  Brillinta was stopped 02/2013 (completed 1 year therapy).  Denies chest pain palpitations or dizziness.  Some fatigue.  Thinks has become more deconditioned since shoulder surgery (02/2013).  Completed rehab this week.  Stays short winded - h/o interstitial lung disease and has completed pulm rehab [facility].  May slightly more short winded since shoulder surgery.  Some acid indigestion despite prevacid use. S/p 5v CABG 2001.  Followed by Dr. Johnsie Cancel.  + h/o chronic diastolic CHF.  Last echo 12/2011 EF 50-55%, mildly dilated LA with atrial septal aneurysm.  Next appt with Dr. Johnsie Cancel is late 06/2013 H/o CKD.  Has not used lasix in 2 years.  Lab Results  Component Value Date   CREATININE 1.40* 02/24/2013   Wt Readings from Last 3 Encounters:  05/11/13 227 lb 8 oz (103.193 kg)  03/15/13 212 lb 12 oz (96.503 kg)  03/06/13 213 lb 8 oz (96.843 kg)  endorses gaining weight since shoulder surgery.  Relevant past medical, surgical, family and social history reviewed and updated. Allergies and medications reviewed and updated. Current Outpatient Prescriptions on File Prior to Visit  Medication Sig  . amLODipine (NORVASC) 10 MG tablet Take 10 mg by mouth daily.  [name] aspirin 81 MG tablet Take 81 mg by mouth daily.    [name] azaTHIOprine (IMURAN) 50 MG tablet Take 50 mg by mouth 2 (two) times daily.  . carvedilol (COREG) 12.5 MG tablet Take 12.5 mg by mouth 2 (two) times daily with a meal.  . gabapentin  (NEURONTIN) 300 MG capsule Take 3 tablets by mouth in the morning and 4-5 tablets at bedtime  . isosorbide mononitrate (IMDUR) 30 MG 24 hr tablet Take 1 tablet (30 mg total) by mouth daily.  [name] losartan (COZAAR) 100 MG tablet TAKE 1 TABLET (100 MG TOTAL) BY MOUTH DAILY.  . metFORMIN (GLUCOPHAGE) 1000 MG tablet Take 1,000 mg by mouth 2 (two) times daily with a meal.  . Multiple Vitamins-Minerals (ICAPS) CAPS Take 1 capsule by mouth daily.   . nitroGLYCERIN (NITROSTAT) 0.4 MG SL tablet Place 1 tablet (0.4 mg total) under the tongue every 5 (five) minutes as needed. Chest pain  . nortriptyline (PAMELOR) 25 MG capsule Take 1-2 capsules (25-50 mg total) by mouth at bedtime.  [name] omeprazole (PRILOSEC OTC) 20 MG tablet Take 20 mg by mouth daily.    [name] oxyCODONE-acetaminophen (ROXICET) 5-325 MG per tablet Take 1-2 tablets by mouth every 4 (four) hours as needed for severe pain.  . pravastatin (PRAVACHOL) 40 MG tablet Take 40 mg by mouth daily.  . vitamin B-12 (CYANOCOBALAMIN) 1000 MCG tablet Take 1,000 mcg by mouth daily.  . Ticagrelor (BRILINTA) 90 MG TABS tablet Take 90 mg by mouth 2 (two) times daily.   No current facility-administered medications on file prior to visit.    Review of Systems Per HPI unless specifically indicated above    Objective:  BP 148/74  Pulse 64  Temp(Src) 97.8 F (36.6 C) (Oral)  Wt 227 lb 8 oz (103.193 kg)  SpO2 95%  Physical Exam  Nursing note and vitals reviewed. Constitutional: He appears well-developed and well-nourished. No distress.  HENT:  Mouth/Throat: Oropharynx is clear and moist. No oropharyngeal exudate.  Neck: Hepatojugular reflux (mild) present. No JVD present.  Cardiovascular: Normal rate, regular rhythm and intact distal pulses.   Murmur (4/6 SEM best at LUSB) heard. Pulmonary/Chest: Effort normal. No respiratory distress. He has no wheezes. He has no rales.  Crackles bibasilarly  Musculoskeletal: He exhibits edema (R 1+ pitting, L tr pedal  edema).  1+ DP bilaterally  Lymphadenopathy:    He has no cervical adenopathy.  Skin: Skin is warm and dry. No rash noted.  Psychiatric: He has a normal mood and affect.      Assessment & Plan:   Problem List Items Addressed This Visit   Dyspnea - Primary     I think pt has a mild CHF exacerbation as evidenced by increased dyspnea, pedal edema, weight gain, and physical exam. Treat with lasix 20mg  daily for 1 week then may use PRN. Check CXR given significant crackles heard on exam to eval pulm edema, but may be pt's baseline ILD. rtc with PCP in 2 wks for f/u. Pt agrees with plan. I don't think this is worsening aortic stenosis.    Relevant Orders      DG Chest 2 View       Follow up plan: Return in about 2 weeks (around 05/25/2013), or if symptoms worsen or fail to improve, for follow up visit with Dr. Silvio Pate.

## 2013-05-11 NOTE — Progress Notes (Signed)
Pre-visit discussion using our clinic review tool. No additional management support is needed unless otherwise documented below in the visit note.  

## 2013-05-11 NOTE — Patient Instructions (Addendum)
Xray today to check on lungs. I think you have a mild heart failure exacerbation - treat with lasix 20mg  daily for next week then may use as needed for swelling or weight over 220 lbs, but make sure you stay well hydrated on this medicine. Return to see [place] in 2 weeks for followup ,sooner if not improving as expected.

## 2013-05-11 NOTE — Assessment & Plan Note (Addendum)
I think pt has a mild CHF exacerbation as evidenced by increased dyspnea, pedal edema, weight gain, and physical exam. Treat with lasix 20mg  daily for 1 week then may use PRN. Check CXR given significant crackles heard on exam to eval pulm edema, but may be pt's baseline ILD. rtc with PCP in 2 wks for f/u. Pt agrees with plan. I don't think this is worsening aortic stenosis.

## 2013-05-11 NOTE — Addendum Note (Signed)
Addended by: Ria Bush on: 05/11/2013 10:59 AM   Modules accepted: Orders

## 2013-05-15 ENCOUNTER — Telehealth: Payer: Self-pay

## 2013-05-15 NOTE — Telephone Encounter (Signed)
Pt said wife was not sure of CXR results given on 05/14/13.Patient notified as instructed by result note message.

## 2013-05-29 ENCOUNTER — Ambulatory Visit (INDEPENDENT_AMBULATORY_CARE_PROVIDER_SITE_OTHER): Payer: Medicare Other | Admitting: Internal Medicine

## 2013-05-29 ENCOUNTER — Encounter: Payer: Self-pay | Admitting: Internal Medicine

## 2013-05-29 VITALS — BP 150/80 | HR 65 | Temp 98.3°F | Wt 229.0 lb

## 2013-05-29 DIAGNOSIS — I5033 Acute on chronic diastolic (congestive) heart failure: Secondary | ICD-10-CM

## 2013-05-29 DIAGNOSIS — I2510 Atherosclerotic heart disease of native coronary artery without angina pectoris: Secondary | ICD-10-CM

## 2013-05-29 MED ORDER — FUROSEMIDE 40 MG PO TABS: 40.0000 mg | ORAL_TABLET | Freq: Every day | ORAL | Status: DC

## 2013-05-29 NOTE — Assessment & Plan Note (Signed)
Some stable angina---may be more noticeable with the fluid overload Continue isosorbide and other meds

## 2013-05-29 NOTE — Progress Notes (Signed)
Pre visit review using our clinic review tool, if applicable. No additional management support is needed unless otherwise documented below in the visit note. 

## 2013-05-29 NOTE — Assessment & Plan Note (Signed)
Still with fluid overload, PND and orthopnea and DOE Will increase furosemide till achieving diuresis Recheck soon

## 2013-05-29 NOTE — Progress Notes (Signed)
Subjective:    Patient ID: [name], male    DOB: Mar 12, 1932, 80 y.o.   MRN: [id number]  HPI Here for follow up Still having leg swelling Breathing has been okay--- still gets DOE Not sleeping well--- has to get in recliner Has had some PND Some chest pain--- "I have been thinking it is indigestion" Does use nitro with some success at times  Took the furosemide for a week---didn't really bring his swelling or weight down  Saw [name] yesterday--hands seem to be okay  Checks weight every other day or so ~220-225#  Current Outpatient Prescriptions on File Prior to Visit  Medication Sig Dispense Refill  . amLODipine (NORVASC) 10 MG tablet Take 10 mg by mouth daily.      [name] aspirin 81 MG tablet Take 81 mg by mouth daily.        [name] azaTHIOprine (IMURAN) 50 MG tablet Take 50 mg by mouth 2 (two) times daily.      . carvedilol (COREG) 12.5 MG tablet Take 12.5 mg by mouth 2 (two) times daily with a meal.      . furosemide (LASIX) 20 MG tablet Take 1 tablet (20 mg total) by mouth daily as needed for fluid or edema.  30 tablet  3  . gabapentin (NEURONTIN) 300 MG capsule Take 3 tablets by mouth in the morning and 4-5 tablets at bedtime      . isosorbide mononitrate (IMDUR) 30 MG 24 hr tablet Take 1 tablet (30 mg total) by mouth daily.  30 tablet  6  . losartan (COZAAR) 100 MG tablet TAKE 1 TABLET (100 MG TOTAL) BY MOUTH DAILY.  90 tablet  3  . metFORMIN (GLUCOPHAGE) 1000 MG tablet Take 1,000 mg by mouth 2 (two) times daily with a meal.      . Multiple Vitamins-Minerals (ICAPS) CAPS Take 1 capsule by mouth daily.       . nitroGLYCERIN (NITROSTAT) 0.4 MG SL tablet Place 1 tablet (0.4 mg total) under the tongue every 5 (five) minutes as needed. Chest pain  25 tablet  4  . nortriptyline (PAMELOR) 25 MG capsule Take 1-2 capsules (25-50 mg total) by mouth at bedtime.  60 capsule  11  . omeprazole (PRILOSEC OTC) 20 MG tablet Take 20 mg by mouth daily.        [name] oxyCODONE-acetaminophen  (ROXICET) 5-325 MG per tablet Take 1-2 tablets by mouth every 4 (four) hours as needed for severe pain.  30 tablet  0  . pravastatin (PRAVACHOL) 40 MG tablet Take 40 mg by mouth daily.      . vitamin B-12 (CYANOCOBALAMIN) 1000 MCG tablet Take 1,000 mcg by mouth daily.       No current facility-administered medications on file prior to visit.    Allergies  Allergen Reactions  . Doxazosin Mesylate Other (See Comments)    REACTION: dizzy  . Methocarbamol Rash    "don't remember how bad"    Past Medical History  Diagnosis Date  . Hyperlipidemia   . Hypertension   . GERD (gastroesophageal reflux disease)   . Arthritis     osteoarthritis, s/p R TKR, and digits  . Overweight (BMI 25.0-29.9)     BMI 29  . Interstitial lung disease     NOS  . Pulmonary infiltrates 12-09    stable, 7/10: ? due to GERD  . Diverticulosis   . Esophageal stricture     s/p dilation spring 2010  . CAD (coronary artery disease)  symptoms followed lopressor Jan-Jul 09 and lisinopril increase July 09, 1/09 Myoview- non ischemic with an EF of 51% and inferobasal wall scar, 10/09 Cath- severe native three-vessel coronary artery disease, s/p multivessel coronary bypass graft with all grafts, Normal LV sys fn with normal LV filling pressures; 10/04-Adenosine cardiolite- thinning wo ischemia, EF 55%; 1/08 Cardiolite neg  . History of PFTs     mixed pattern on spiro. mild restn on lung volumes with near normal DLCO. Pattern can be explained by CABG scar. Fev1 2.2L/73%, ratio 68 (67), TLC 4.7/68%,RV 1.5L/55%,DLCO 79%  . Colon polyps   . Chronic kidney disease, stage III (moderate)   . Chronic diastolic CHF (congestive heart failure)     a) 09/13 ECHO- LVEF 99991111, grade 1 diastolic dysfunction, mild LA dilatation, atrial septal aneurysm, AV mobility restricted, but no sig AS by doppler; b) 09/04/08 ECHO- LVH, ef 60%, mild AS,  . Heart murmur   . Type II diabetes mellitus   . Chronic lower back pain   . RA  (rheumatoid arthritis)     [name]  . Myocardial infarction     pci  . Dyspnea 2009 since July -Sept    05/06/08-CPST-  normal effort, reduced VO2 max 20.5 /65%, reduced at 8.2/ 40%, normal breathing resetvca of 55%, submaximal heart rate response 112/77%, flattened o2 pluse response at peak exercise-12 ml/beat @ 85%, No VQ mismatch abnormalities, All c/w CIRC Limitation  . Hiatal hernia   . Anginal pain 02/24/13    Past Surgical History  Procedure Laterality Date  . Total knee arthroplasty  03/2010    Right- [name]  . Cholecystectomy  11/2003    [name]- open  . Knee arthroscopy  2008    right  . Cataract extraction w/ intraocular lens  implant, bilateral    . Coronary artery bypass graft  11/1999    CABG X5  . Cardiac catheterization  5/06    CP- no MI, Cath- small vessell disease  . Cardiac catheterization  12/31/2011    80% distal LM, 100% native LAD, LCx and RCA, 30% prox SVG-OM, SVG-D1 normal, 99% distal, 80% ostial SVG-RCA distal to graft, LIMA-LAD normal; LVEF mildly decreased with posterior basal AK  . Ptca  01/03/2012    Successful DES to SVG-RCA and cutting balloon angioplasty ostial  PDA  . Eye surgery    . Coronary stent placement  02/2012    1 stent and balloon  . Shoulder arthroscopy with open rotator cuff repair and distal clavicle acrominectomy Left 02/27/2013    Procedure: LEFT SHOULDER ARTHROSCOPY WITH MINI OPEN ROTATOR CUFF REPAIR AND SUBACROMIAL DECOMPRESSION AND DISTAL CLAVICLE RESECTION;  Surgeon: [name], MD;  Location: [place];  Service: Orthopedics;  Laterality: Left;  . Trigger finger release Left 02/27/2013    Procedure: RELEASE TRIGGER FINGER/A-1 PULLEY;  Surgeon: [name], MD;  Location: [place];  Service: Orthopedics;  Laterality: Left;    Family History  Problem Relation Age of Onset  . COPD Mother   . Heart disease Father   . Diabetes Brother   . Colon cancer Brother   . Alcohol abuse Sister     History   Social History   . Marital Status: Married    Spouse Name: N/A    Number of Children: N/A  . Years of Education: N/A   Occupational History  . lawn mower     retired   Social History Main Topics  . Smoking status: Former Smoker -- 1.00 packs/day  for 20 years    Types: Cigarettes    Quit date: 04/06/1963  . Smokeless tobacco: Never Used  . Alcohol Use: No     Comment: 01/01/2012 "last alcohol ~ 50 yr ago"  . Drug Use: No  . Sexual Activity: Not Currently   Other Topics Concern  . Not on file   Social History Narrative   No living will   Requests wife as health care POA   Discussed DNR --he requests this (done 08/29/12)   Not sure about feeding tube---but might accept for some time   Review of Systems Still has numbness in finger that had surgery a few months ago-- left index Appetite is fine Weight is up 2#    Objective:   Physical Exam  Constitutional: He appears well-developed and well-nourished. No distress.  Neck: Normal range of motion. Neck supple. No JVD present. No thyromegaly present.  Cardiovascular: Normal rate and normal heart sounds.  Exam reveals no gallop.   Gr 2/6 aortic systolic murmur  Pulmonary/Chest: Effort normal. No respiratory distress. He has no wheezes.  Decreased breath sounds with some dullness at bases  Musculoskeletal: He exhibits edema.  1+ non pitting edema bilaterally  Lymphadenopathy:    He has no cervical adenopathy.  Psychiatric: He has a normal mood and affect. His behavior is normal.          Assessment & Plan:

## 2013-05-29 NOTE — Patient Instructions (Signed)
Please weigh yourself daily. Take the furosemide 40mg  every morning--take your first dose today after you pick it up. Call me on Friday if you haven't lost 3-5# by then-----I will increase the furosemide dose

## 2013-06-08 ENCOUNTER — Other Ambulatory Visit: Payer: Self-pay

## 2013-06-08 MED ORDER — CARVEDILOL 12.5 MG PO TABS: 12.5000 mg | ORAL_TABLET | Freq: Two times a day (BID) | ORAL | Status: DC

## 2013-06-13 ENCOUNTER — Encounter: Payer: Self-pay | Admitting: Internal Medicine

## 2013-06-13 ENCOUNTER — Ambulatory Visit (INDEPENDENT_AMBULATORY_CARE_PROVIDER_SITE_OTHER): Payer: Medicare Other | Admitting: Internal Medicine

## 2013-06-13 ENCOUNTER — Other Ambulatory Visit: Payer: Self-pay | Admitting: Family Medicine

## 2013-06-13 VITALS — BP 128/70 | HR 65 | Temp 97.7°F | Wt 226.0 lb

## 2013-06-13 DIAGNOSIS — E1142 Type 2 diabetes mellitus with diabetic polyneuropathy: Secondary | ICD-10-CM

## 2013-06-13 DIAGNOSIS — I5033 Acute on chronic diastolic (congestive) heart failure: Secondary | ICD-10-CM

## 2013-06-13 DIAGNOSIS — R7989 Other specified abnormal findings of blood chemistry: Secondary | ICD-10-CM

## 2013-06-13 DIAGNOSIS — E1149 Type 2 diabetes mellitus with other diabetic neurological complication: Secondary | ICD-10-CM

## 2013-06-13 LAB — BASIC METABOLIC PANEL
BUN: 31 mg/dL — AB (ref 6–23)
CALCIUM: 9.7 mg/dL (ref 8.4–10.5)
CO2: 32 mEq/L (ref 19–32)
CREATININE: 1.8 mg/dL — AB (ref 0.4–1.5)
Chloride: 98 mEq/L (ref 96–112)
GFR: 39.3 mL/min — ABNORMAL LOW (ref >60.00)
GLUCOSE: 206 mg/dL — AB (ref 70–99)
POTASSIUM: 4.5 meq/L (ref 3.5–5.1)
Sodium: 138 mEq/L (ref 135–145)

## 2013-06-13 NOTE — Assessment & Plan Note (Signed)
Better but still not at dry weight Will have him increase the furosemide to 80mg  daily if his weight is over 225# at home He is no longer having the DOE or chest pain though

## 2013-06-13 NOTE — Patient Instructions (Signed)
Please continue the furosemide 40mg  daily. Your proper weight is about 220#. If you are over 225#, please take 80mg  of the furosemide instead of the 40mg 

## 2013-06-13 NOTE — Progress Notes (Signed)
Subjective:    Patient ID: [name], male    DOB: [birth year], 80 y.o.   MRN: [id number]  HPI Feels a little better Weight down 3# Feels better--- breathing is better Less swelling in feet  No chest pain Hasn't needed nitro recently  Current Outpatient Prescriptions on File Prior to Visit  Medication Sig Dispense Refill  . amLODipine (NORVASC) 10 MG tablet Take 10 mg by mouth daily.      [name] aspirin 81 MG tablet Take 81 mg by mouth daily.        [name] azaTHIOprine (IMURAN) 50 MG tablet Take 50 mg by mouth 2 (two) times daily.      . carvedilol (COREG) 12.5 MG tablet Take 1 tablet (12.5 mg total) by mouth 2 (two) times daily with a meal.  60 tablet  3  . furosemide (LASIX) 40 MG tablet Take 1-2 tablets (40-80 mg total) by mouth daily.  60 tablet  11  . gabapentin (NEURONTIN) 300 MG capsule Take 3 tablets by mouth in the morning and 4-5 tablets at bedtime      . isosorbide mononitrate (IMDUR) 30 MG 24 hr tablet Take 1 tablet (30 mg total) by mouth daily.  30 tablet  6  . losartan (COZAAR) 100 MG tablet TAKE 1 TABLET (100 MG TOTAL) BY MOUTH DAILY.  90 tablet  3  . metFORMIN (GLUCOPHAGE) 1000 MG tablet Take 1,000 mg by mouth 2 (two) times daily with a meal.      . Multiple Vitamins-Minerals (ICAPS) CAPS Take 1 capsule by mouth daily.       . nitroGLYCERIN (NITROSTAT) 0.4 MG SL tablet Place 1 tablet (0.4 mg total) under the tongue every 5 (five) minutes as needed. Chest pain  25 tablet  4  . nortriptyline (PAMELOR) 25 MG capsule Take 1-2 capsules (25-50 mg total) by mouth at bedtime.  60 capsule  11  . omeprazole (PRILOSEC OTC) 20 MG tablet Take 20 mg by mouth daily.        [name] oxyCODONE-acetaminophen (ROXICET) 5-325 MG per tablet Take 1-2 tablets by mouth every 4 (four) hours as needed for severe pain.  30 tablet  0  . pravastatin (PRAVACHOL) 40 MG tablet Take 40 mg by mouth daily.      . vitamin B-12 (CYANOCOBALAMIN) 1000 MCG tablet Take 1,000 mcg by mouth daily.       No current  facility-administered medications on file prior to visit.    Allergies  Allergen Reactions  . Doxazosin Mesylate Other (See Comments)    REACTION: dizzy  . Methocarbamol Rash    "don't remember how bad"    Past Medical History  Diagnosis Date  . Hyperlipidemia   . Hypertension   . GERD (gastroesophageal reflux disease)   . Arthritis     osteoarthritis, s/p R TKR, and digits  . Overweight (BMI 25.0-29.9)     BMI 29  . Interstitial lung disease     NOS  . Pulmonary infiltrates 12-09    stable, 7/10: ? due to GERD  . Diverticulosis   . Esophageal stricture     s/p dilation spring 2010  . CAD (coronary artery disease)     symptoms followed lopressor Jan-Jul 09 and lisinopril increase July 09, 1/09 Myoview- non ischemic with an EF of 51% and inferobasal wall scar, 10/09 Cath- severe native three-vessel coronary artery disease, s/p multivessel coronary bypass graft with all grafts, Normal LV sys fn with normal LV filling pressures; 10/04-Adenosine cardiolite- thinning wo  ischemia, EF 55%; 1/08 Cardiolite neg  . History of PFTs     mixed pattern on spiro. mild restn on lung volumes with near normal DLCO. Pattern can be explained by CABG scar. Fev1 2.2L/73%, ratio 68 (67), TLC 4.7/68%,RV 1.5L/55%,DLCO 79%  . Colon polyps   . Chronic kidney disease, stage III (moderate)   . Chronic diastolic CHF (congestive heart failure)     a) 09/13 ECHO- LVEF 99991111, grade 1 diastolic dysfunction, mild LA dilatation, atrial septal aneurysm, AV mobility restricted, but no sig AS by doppler; b) 09/04/08 ECHO- LVH, ef 60%, mild AS,  . Heart murmur   . Type II diabetes mellitus   . Chronic lower back pain   . RA (rheumatoid arthritis)     [name]  . Myocardial infarction     pci  . Dyspnea 2009 since July -Sept    05/06/08-[facility]-  normal effort, reduced VO2 max 20.5 /65%, reduced at 8.2/ 40%, normal breathing resetvca of 55%, submaximal heart rate response 112/77%, flattened o2 pluse response at peak  exercise-12 ml/beat @ 85%, No VQ mismatch abnormalities, All c/w CIRC Limitation  . Hiatal hernia   . Anginal pain 02/24/13    Past Surgical History  Procedure Laterality Date  . Total knee arthroplasty  03/2010    Right- [name]  . Cholecystectomy  11/2003    [name]- open  . Knee arthroscopy  2008    right  . Cataract extraction w/ intraocular lens  implant, bilateral    . Coronary artery bypass graft  11/1999    CABG X5  . Cardiac catheterization  5/06    CP- no MI, Cath- small vessell disease  . Cardiac catheterization  12/31/2011    80% distal LM, 100% native LAD, LCx and RCA, 30% prox SVG-OM, SVG-D1 normal, 99% distal, 80% ostial SVG-RCA distal to graft, LIMA-LAD normal; LVEF mildly decreased with posterior basal AK  . Ptca  01/03/2012    Successful DES to SVG-RCA and cutting balloon angioplasty ostial  PDA  . Eye surgery    . Coronary stent placement  02/2012    1 stent and balloon  . Shoulder arthroscopy with open rotator cuff repair and distal clavicle acrominectomy Left 02/27/2013    Procedure: LEFT SHOULDER ARTHROSCOPY WITH MINI OPEN ROTATOR CUFF REPAIR AND SUBACROMIAL DECOMPRESSION AND DISTAL CLAVICLE RESECTION;  Surgeon: [name], MD;  Location: [facility];  Service: Orthopedics;  Laterality: Left;  . Trigger finger release Left 02/27/2013    Procedure: RELEASE TRIGGER FINGER/A-1 PULLEY;  Surgeon: [name], MD;  Location: [facility];  Service: Orthopedics;  Laterality: Left;    Family History  Problem Relation Age of Onset  . COPD Mother   . Heart disease Father   . Diabetes Brother   . Colon cancer Brother   . Alcohol abuse Sister     History   Social History  . Marital Status: Married    Spouse Name: N/A    Number of Children: N/A  . Years of Education: N/A   Occupational History  . lawn mower     retired   Social History Main Topics  . Smoking status: Former Smoker -- 1.00 packs/day for 20 years    Types: Cigarettes    Quit date:  04/06/1963  . Smokeless tobacco: Never Used  . Alcohol Use: No     Comment: 01/01/2012 "last alcohol ~ 50 yr ago"  . Drug Use: No  . Sexual Activity: Not Currently   Other Topics  Concern  . Not on file   Social History Narrative   No living will   Requests wife as health care POA   Discussed DNR --he requests this (done 08/29/12)   Not sure about feeding tube---but might accept for some time   Review of Systems Sleeps okay--no PND Appetite is good    Objective:   Physical Exam  Constitutional: He appears well-developed and well-nourished. No distress.  Neck: Normal range of motion. Neck supple.  Cardiovascular: Normal rate and regular rhythm.  Exam reveals no gallop.   Soft systolic murmur  Pulmonary/Chest: Effort normal. No respiratory distress. He has no wheezes.  Faint bibasilar crackles  Musculoskeletal:  Trace edema only Plantar callous laterally on left  Lymphadenopathy:    He has no cervical adenopathy.  Psychiatric: He has a normal mood and affect. His behavior is normal.          Assessment & Plan:

## 2013-06-13 NOTE — Progress Notes (Signed)
Pre visit review using our clinic review tool, if applicable. No additional management support is needed unless otherwise documented below in the visit note. 

## 2013-06-13 NOTE — Assessment & Plan Note (Signed)
Form signed for diabetic shoes

## 2013-06-21 ENCOUNTER — Other Ambulatory Visit (INDEPENDENT_AMBULATORY_CARE_PROVIDER_SITE_OTHER): Payer: Medicare Other

## 2013-06-21 DIAGNOSIS — R799 Abnormal finding of blood chemistry, unspecified: Secondary | ICD-10-CM

## 2013-06-21 DIAGNOSIS — R7989 Other specified abnormal findings of blood chemistry: Secondary | ICD-10-CM

## 2013-06-21 LAB — BASIC METABOLIC PANEL WITH GFR
BUN: 25 mg/dL — ABNORMAL HIGH (ref 6–23)
CO2: 27 meq/L (ref 19–32)
Calcium: 9.4 mg/dL (ref 8.4–10.5)
Chloride: 96 meq/L (ref 96–112)
Creatinine, Ser: 1.8 mg/dL — ABNORMAL HIGH (ref 0.4–1.5)
GFR: 38.79 mL/min — ABNORMAL LOW
Glucose, Bld: 216 mg/dL — ABNORMAL HIGH (ref 70–99)
Potassium: 3.7 meq/L (ref 3.5–5.1)
Sodium: 138 meq/L (ref 135–145)

## 2013-06-26 ENCOUNTER — Encounter: Payer: Self-pay | Admitting: Cardiovascular Disease

## 2013-06-26 ENCOUNTER — Ambulatory Visit: Payer: Medicare Other | Admitting: Cardiovascular Disease

## 2013-06-26 ENCOUNTER — Ambulatory Visit (INDEPENDENT_AMBULATORY_CARE_PROVIDER_SITE_OTHER): Payer: Medicare Other | Admitting: Cardiovascular Disease

## 2013-06-26 VITALS — BP 160/72 | HR 50 | Ht 73.0 in | Wt 223.0 lb

## 2013-06-26 DIAGNOSIS — J849 Interstitial pulmonary disease, unspecified: Secondary | ICD-10-CM

## 2013-06-26 DIAGNOSIS — I10 Essential (primary) hypertension: Secondary | ICD-10-CM

## 2013-06-26 DIAGNOSIS — J8410 Pulmonary fibrosis, unspecified: Secondary | ICD-10-CM

## 2013-06-26 DIAGNOSIS — I5032 Chronic diastolic (congestive) heart failure: Secondary | ICD-10-CM

## 2013-06-26 DIAGNOSIS — I359 Nonrheumatic aortic valve disorder, unspecified: Secondary | ICD-10-CM

## 2013-06-26 DIAGNOSIS — I2510 Atherosclerotic heart disease of native coronary artery without angina pectoris: Secondary | ICD-10-CM

## 2013-06-26 NOTE — Assessment & Plan Note (Signed)
No change in murmur stable consider f/u echo in a year

## 2013-06-26 NOTE — Patient Instructions (Signed)
Your physician wants you to follow-up in:  6 months. You will receive a reminder letter in the mail two months in advance. If you don't receive a letter, please call our office to schedule the follow-up appointment.   

## 2013-06-26 NOTE — Assessment & Plan Note (Signed)
F/U pulmonary clinically this is more primary cause of his dyspnea

## 2013-06-26 NOTE — Progress Notes (Signed)
Patient ID: [name], male   DOB: 1931/07/21, 80 y.o.   MRN: [id number] [name] is a 80 y.o. male who returns for follow up   He has a history of CAD, s/p CABG, interstitial lung disease, mild aortic stenosis, HTN, HL, DM2, CKD, rheumatoid arthritis. He has chronic dyspnea.  [id number] Cardiac catheterization demonstrated high-grade disease in the SVG-RCA as well as ostial PDA distal to the graft. He had some complications from his diagnostic cath with groin hematoma. He was brought back to the Cath Lab several days later for PCI with DES to the [facility] and cutting PTCA to the [facility]. Dual antiplatelet therapy recommended for one year.   LHC 12/31/11: dLM 80%, LAD 100%, circumflex 100%, RCA 100%, proximal SVG-OM 30%, SVG-D1 ok, SVG-RCA 99% distal and 80% ostial PDA distal to graft, LIMA-LAD ok with some left to right collaterals.  PCI 01/03/12: Promus DES to the SVG-RCA and Cutting Balloon angioplasty to the ostial PDA.   Echocardiogram 12/31/11: EF 99991111, grade 1 diastolic dysfunction, mild aortic stenosis, mean gradient 9, mild LAE.  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  All other systems reviewed and negative  General: Affect appropriate Chronically ill overweight white male  HEENT: normal Neck supple with no adenopathy JVP normal no bruits no thyromegaly Lungs interstitial crackles  with no wheezing and good diaphragmatic motion Heart:  S1/S2 AS  murmur, no rub, gallop or click PMI normal Abdomen: benighn, BS positve, no tenderness, no AAA no bruit.  No HSM or HJR Distal pulses  intact with no bruits Trace edema  With varicose veins  Neuro non-focal Skin warm and dry No muscular weakness   Current Outpatient Prescriptions  Medication Sig Dispense Refill  . amLODipine (NORVASC) 10 MG tablet Take 10 mg by mouth daily.      [name] aspirin 81 MG tablet Take 81 mg by mouth daily.        [name] azaTHIOprine (IMURAN) 50 MG tablet Take 50 mg by mouth 2 (two) times daily.      . carvedilol (COREG) 12.5 MG tablet Take 1 tablet (12.5 mg total) by mouth 2 (two) times daily with a meal.  60 tablet  3  . furosemide (LASIX) 40 MG tablet Take 1-2 tablets (40-80 mg total) by mouth daily.  60 tablet  11  . gabapentin (NEURONTIN) 300 MG capsule Take 3 tablets by mouth in the morning and 4-5 tablets at bedtime      . isosorbide mononitrate (IMDUR) 30 MG 24 hr tablet Take 1 tablet (30 mg total) by mouth daily.  30 tablet  6  . losartan (COZAAR) 100 MG tablet TAKE 1 TABLET (100 MG TOTAL) BY MOUTH DAILY.  90 tablet  3  . metFORMIN (GLUCOPHAGE) 1000 MG tablet Take 1,000 mg by mouth 2 (two) times daily with a meal.      . Multiple Vitamins-Minerals (ICAPS) CAPS Take 1 capsule by mouth daily.       . nitroGLYCERIN (NITROSTAT) 0.4 MG SL tablet Place 1 tablet (0.4 mg total) under the tongue every 5 (five) minutes as needed. Chest pain  25  tablet  4  . nortriptyline (PAMELOR) 25 MG capsule Take 1-2 capsules (25-50 mg total) by mouth at bedtime.  60 capsule  11  . omeprazole (PRILOSEC OTC) 20 MG tablet Take 20 mg by mouth daily.        [name] oxyCODONE-acetaminophen (ROXICET) 5-325 MG per tablet Take 1-2 tablets by mouth every 4 (four) hours as needed for severe pain.  30 tablet  0  . pravastatin (PRAVACHOL) 40 MG tablet Take 40 mg by mouth daily.      . vitamin B-12 (CYANOCOBALAMIN) 1000 MCG tablet Take 1,000 mcg by mouth daily.       No current facility-administered medications for this visit.    Allergies  Doxazosin mesylate and Methocarbamol  Electrocardiogram:  SR normal   Assessment and  Plan

## 2013-06-26 NOTE — Assessment & Plan Note (Signed)
223 today dry weight 225  Lasix dose appr.   Improved

## 2013-06-26 NOTE — Assessment & Plan Note (Signed)
Well controlled.  Continue current medications and low sodium Dash type diet.    

## 2013-06-26 NOTE — Assessment & Plan Note (Signed)
Stable with no angina and good activity level.  Continue medical Rx  

## 2013-09-10 ENCOUNTER — Encounter: Payer: Self-pay | Admitting: Internal Medicine

## 2013-09-10 ENCOUNTER — Encounter (INDEPENDENT_AMBULATORY_CARE_PROVIDER_SITE_OTHER): Payer: Self-pay

## 2013-09-10 ENCOUNTER — Encounter: Payer: Self-pay | Admitting: *Deleted

## 2013-09-10 ENCOUNTER — Ambulatory Visit (INDEPENDENT_AMBULATORY_CARE_PROVIDER_SITE_OTHER): Payer: Medicare Other | Admitting: Internal Medicine

## 2013-09-10 VITALS — BP 138/80 | HR 71 | Temp 98.2°F | Ht 73.0 in | Wt 230.0 lb

## 2013-09-10 DIAGNOSIS — E1142 Type 2 diabetes mellitus with diabetic polyneuropathy: Secondary | ICD-10-CM

## 2013-09-10 DIAGNOSIS — M059 Rheumatoid arthritis with rheumatoid factor, unspecified: Secondary | ICD-10-CM

## 2013-09-10 DIAGNOSIS — J849 Interstitial pulmonary disease, unspecified: Secondary | ICD-10-CM

## 2013-09-10 DIAGNOSIS — K222 Esophageal obstruction: Secondary | ICD-10-CM

## 2013-09-10 DIAGNOSIS — N183 Chronic kidney disease, stage 3 unspecified: Secondary | ICD-10-CM

## 2013-09-10 DIAGNOSIS — E1149 Type 2 diabetes mellitus with other diabetic neurological complication: Secondary | ICD-10-CM

## 2013-09-10 DIAGNOSIS — E1129 Type 2 diabetes mellitus with other diabetic kidney complication: Secondary | ICD-10-CM

## 2013-09-10 DIAGNOSIS — J8410 Pulmonary fibrosis, unspecified: Secondary | ICD-10-CM

## 2013-09-10 DIAGNOSIS — Z23 Encounter for immunization: Secondary | ICD-10-CM

## 2013-09-10 DIAGNOSIS — M069 Rheumatoid arthritis, unspecified: Secondary | ICD-10-CM

## 2013-09-10 DIAGNOSIS — I5032 Chronic diastolic (congestive) heart failure: Secondary | ICD-10-CM

## 2013-09-10 DIAGNOSIS — Z0000 Encounter for general adult medical examination without abnormal findings: Secondary | ICD-10-CM

## 2013-09-10 DIAGNOSIS — N400 Enlarged prostate without lower urinary tract symptoms: Secondary | ICD-10-CM

## 2013-09-10 DIAGNOSIS — I359 Nonrheumatic aortic valve disorder, unspecified: Secondary | ICD-10-CM

## 2013-09-10 LAB — HEMOGLOBIN A1C: HEMOGLOBIN A1C: 7.6 % — AB (ref 4.6–6.5)

## 2013-09-10 NOTE — Assessment & Plan Note (Signed)
[name] managing

## 2013-09-10 NOTE — Patient Instructions (Signed)
Please remember to take 2 of the furosemide daily if your weight is over 225# at home.  Diabetes Meal Planning Guide The diabetes meal planning guide is a tool to help you plan your meals and snacks. It is important for people with diabetes to manage their blood glucose (sugar) levels. Choosing the right foods and the right amounts throughout your day will help control your blood glucose. Eating right can even help you improve your blood pressure and reach or maintain a healthy weight. CARBOHYDRATE COUNTING MADE EASY When you eat carbohydrates, they turn to sugar. This raises your blood glucose level. Counting carbohydrates can help you control this level so you feel better. When you plan your meals by counting carbohydrates, you can have more flexibility in what you eat and balance your medicine with your food intake. Carbohydrate counting simply means adding up the total amount of carbohydrate grams in your meals and snacks. Try to eat about the same amount at each meal. Foods with carbohydrates are listed below. Each portion below is 1 carbohydrate serving or 15 grams of carbohydrates. Ask your dietician how many grams of carbohydrates you should eat at each meal or snack. Grains and Starches  1 slice bread.   English muffin or hotdog/hamburger bun.   cup cold cereal (unsweetened).   cup cooked pasta or rice.   cup starchy vegetables (corn, potatoes, peas, beans, winter squash).  1 tortilla (6 inches).   bagel.  1 waffle or pancake (size of a CD).   cup cooked cereal.  4 to 6 small crackers. *Whole grain is recommended. Fruit  1 cup fresh unsweetened berries, melon, papaya, pineapple.  1 small fresh fruit.   banana or mango.   cup fruit juice (4 oz unsweetened).   cup canned fruit in natural juice or water.  2 tbs dried fruit.  12 to 15 grapes or cherries. Milk and Yogurt  1 cup fat-free or 1% milk.  1 cup soy milk.  6 oz light yogurt with sugar-free  sweetener.  6 oz low-fat soy yogurt.  6 oz plain yogurt. Vegetables  1 cup raw or  cup cooked is counted as 0 carbohydrates or a "free" food.  If you eat 3 or more servings at 1 meal, count them as 1 carbohydrate serving. Other Carbohydrates   oz chips or pretzels.   cup ice cream or frozen yogurt.   cup sherbet or sorbet.  2 inch square cake, no frosting.  1 tbs honey, sugar, jam, jelly, or syrup.  2 small cookies.  3 squares of graham crackers.  3 cups popcorn.  6 crackers.  1 cup broth-based soup.  Count 1 cup casserole or other mixed foods as 2 carbohydrate servings.  Foods with less than 20 calories in a serving may be counted as 0 carbohydrates or a "free" food. You may want to purchase a book or computer software that lists the carbohydrate gram counts of different foods. In addition, the nutrition facts panel on the labels of the foods you eat are a good source of this information. The label will tell you how big the serving size is and the total number of carbohydrate grams you will be eating per serving. Divide this number by 15 to obtain the number of carbohydrate servings in a portion. Remember, 1 carbohydrate serving equals 15 grams of carbohydrate. SERVING SIZES Measuring foods and serving sizes helps you make sure you are getting the right amount of food. The list below tells how big or small  some common serving sizes are.  1 oz.........4 stacked dice.  3 oz........Marland KitchenDeck of cards.  1 tsp.......Marland KitchenTip of little finger.  1 tbs......Marland KitchenMarland KitchenThumb.  2 tbs.......Marland KitchenGolf ball.   cup......Marland KitchenHalf of a fist.  1 cup.......Marland KitchenA fist. SAMPLE DIABETES MEAL PLAN Below is a sample meal plan that includes foods from the grain and starches, dairy, vegetable, fruit, and meat groups. A dietician can individualize a meal plan to fit your calorie needs and tell you the number of servings needed from each food group. However, controlling the total amount of carbohydrates in your  meal or snack is more important than making sure you include all of the food groups at every meal. You may interchange carbohydrate containing foods (dairy, starches, and fruits). The meal plan below is an example of a 2000 calorie diet using carbohydrate counting. This meal plan has 17 carbohydrate servings. Breakfast  1 cup oatmeal (2 carb servings).   cup light yogurt (1 carb serving).  1 cup blueberries (1 carb serving).   cup almonds. Snack  1 large apple (2 carb servings).  1 low-fat string cheese stick. Lunch  Chicken breast salad.  1 cup spinach.   cup chopped tomatoes.  2 oz chicken breast, sliced.  2 tbs low-fat [place] dressing.  12 whole-wheat crackers (2 carb servings).  12 to 15 grapes (1 carb serving).  1 cup low-fat milk (1 carb serving). Snack  1 cup carrots.   cup hummus (1 carb serving). Dinner  3 oz broiled salmon.  1 cup brown rice (3 carb servings). Snack  1  cups steamed broccoli (1 carb serving) drizzled with 1 tsp olive oil and lemon juice.  1 cup light pudding (2 carb servings). DIABETES MEAL PLANNING WORKSHEET Your dietician can use this worksheet to help you decide how many servings of foods and what types of foods are right for you.  BREAKFAST Food Group and Servings / Carb Servings Grain/Starches __________________________________ Dairy __________________________________________ Vegetable ______________________________________ Fruit ___________________________________________ Meat __________________________________________ Fat ____________________________________________ LUNCH Food Group and Servings / Carb Servings Grain/Starches ___________________________________ Dairy ___________________________________________ Fruit ____________________________________________ Meat ___________________________________________ Fat _____________________________________________ [name] Food Group and Servings / Carb  Servings Grain/Starches ___________________________________ Dairy ___________________________________________ Fruit ____________________________________________ Meat ___________________________________________ Fat _____________________________________________ SNACKS Food Group and Servings / Carb Servings Grain/Starches ___________________________________ Dairy ___________________________________________ Vegetable _______________________________________ Fruit ____________________________________________ Meat ___________________________________________ Fat _____________________________________________ DAILY TOTALS Starches _________________________ Vegetable ________________________ Fruit ____________________________ Dairy ____________________________ Meat ____________________________ Fat ______________________________ Document Released: 12/17/2004 Document Revised: 06/14/2011 Document Reviewed: 10/28/2008 [employer] Patient Information 2014 [employer]

## 2013-09-10 NOTE — Addendum Note (Signed)
Addended by: Despina Hidden on: 09/10/2013 03:38 PM   Modules accepted: Orders

## 2013-09-10 NOTE — Assessment & Plan Note (Signed)
Some dysphagia Needs to stay on the PPI

## 2013-09-10 NOTE — Assessment & Plan Note (Signed)
No sig symptoms but weight up Discussed increase of furosemide based on weight

## 2013-09-10 NOTE — Assessment & Plan Note (Signed)
Ongoing DOE---probably adds to the CHF issues

## 2013-09-10 NOTE — Assessment & Plan Note (Signed)
I don't have his last blood work by Dr D Referral is appropriate regardless

## 2013-09-10 NOTE — Progress Notes (Signed)
Pre visit review using our clinic review tool, if applicable. No additional management support is needed unless otherwise documented below in the visit note. 

## 2013-09-10 NOTE — Assessment & Plan Note (Signed)
Hopefully still good control 

## 2013-09-10 NOTE — Assessment & Plan Note (Signed)
I have personally reviewed the Medicare Annual Wellness questionnaire and have noted 1. The patient's medical and social history 2. Their use of alcohol, tobacco or illicit drugs 3. Their current medications and supplements 4. The patient's functional ability including ADL's, fall risks, home safety risks and hearing or visual             impairment. 5. Diet and physical activities 6. Evidence for depression or mood disorders  The patients weight, height, BMI and visual acuity have been recorded in the chart I have made referrals, counseling and provided education to the patient based review of the above and I have provided the pt with a written personalized care plan for preventive services.  I have provided you with a copy of your personalized plan for preventive services. Please take the time to review along with your updated medication list.  Will give prevnar today Prefers no zostavax Yearly flu shots No cancer screening due to age

## 2013-09-10 NOTE — Progress Notes (Signed)
Subjective:    Patient ID: [name], male    DOB: [birth year], 80 y.o.   MRN: [id number]  HPI Here for Medicare wellness, physical and follow up Reviewed his form---though incomplete Sees [name] tomorrow--- keeps up with blood work Reviewed other doctors No tobacco or alcohol Independent with ADLs---still mows lawns Vision fair--some trouble with right eye though (getting shots) Poor hearing--has aides but doesn't always wear No apparent memory changes  azothioprine for RA still Not helping much Will discuss with Dr D tomorrow Hands are tight and sore  Has gained 7# Relates this to recent cruise Continues on the furosemide daily Breathing is so-so---not great Sleeps in recliner-- no PND Some mild increase in DOE  Checks sugars intermittently--like once a week Generally under 160 fasting No hypoglycemia Still with burning and pain in feet--- bad at times. Still on gabapentin 900/1500  Set up to see nephrologist by [name] Will speak to her about this tomorrow  Current Outpatient Prescriptions on File Prior to Visit  Medication Sig Dispense Refill  . amLODipine (NORVASC) 10 MG tablet Take 10 mg by mouth daily.      [name] aspirin 81 MG tablet Take 81 mg by mouth daily.        [name] azaTHIOprine (IMURAN) 50 MG tablet Take 50 mg by mouth 2 (two) times daily.      . carvedilol (COREG) 12.5 MG tablet Take 1 tablet (12.5 mg total) by mouth 2 (two) times daily with a meal.  60 tablet  3  . furosemide (LASIX) 40 MG tablet Take 1-2 tablets (40-80 mg total) by mouth daily.  60 tablet  11  . gabapentin (NEURONTIN) 300 MG capsule Take 3 tablets by mouth in the morning and 4-5 tablets at bedtime      . isosorbide mononitrate (IMDUR) 30 MG 24 hr tablet Take 1 tablet (30 mg total) by mouth daily.  30 tablet  6  . losartan (COZAAR) 100 MG tablet TAKE 1 TABLET (100 MG TOTAL) BY MOUTH DAILY.  90 tablet  3  . metFORMIN (GLUCOPHAGE) 1000 MG tablet Take 1,000 mg by mouth 2 (two)  times daily with a meal.      . Multiple Vitamins-Minerals (ICAPS) CAPS Take 1 capsule by mouth daily.       . nitroGLYCERIN (NITROSTAT) 0.4 MG SL tablet Place 1 tablet (0.4 mg total) under the tongue every 5 (five) minutes as needed. Chest pain  25 tablet  4  . nortriptyline (PAMELOR) 25 MG capsule Take 1-2 capsules (25-50 mg total) by mouth at bedtime.  60 capsule  11  . omeprazole (PRILOSEC OTC) 20 MG tablet Take 20 mg by mouth daily.        [name] oxyCODONE-acetaminophen (ROXICET) 5-325 MG per tablet Take 1-2 tablets by mouth every 4 (four) hours as needed for severe pain.  30 tablet  0  . pravastatin (PRAVACHOL) 40 MG tablet Take 40 mg by mouth daily.      . vitamin B-12 (CYANOCOBALAMIN) 1000 MCG tablet Take 1,000 mcg by mouth daily.       No current facility-administered medications on file prior to visit.    Allergies  Allergen Reactions  . Doxazosin Mesylate Other (See Comments)    REACTION: dizzy  . Methocarbamol Rash    "don't remember how bad"    Past Medical History  Diagnosis Date  . Hyperlipidemia   . Hypertension   . GERD (gastroesophageal reflux disease)   . Arthritis  osteoarthritis, s/p R TKR, and digits  . Overweight (BMI 25.0-29.9)     BMI 29  . Interstitial lung disease     NOS  . Pulmonary infiltrates 12-09    stable, 7/10: ? due to GERD  . Diverticulosis   . Esophageal stricture     s/p dilation spring 2010  . CAD (coronary artery disease)     symptoms followed lopressor Jan-Jul 09 and lisinopril increase July 09, 1/09 Myoview- non ischemic with an EF of 51% and inferobasal wall scar, 10/09 Cath- severe native three-vessel coronary artery disease, s/p multivessel coronary bypass graft with all grafts, Normal LV sys fn with normal LV filling pressures; 10/04-Adenosine cardiolite- thinning wo ischemia, EF 55%; 1/08 Cardiolite neg  . History of PFTs     mixed pattern on spiro. mild restn on lung volumes with near normal DLCO. Pattern can be explained by CABG  scar. Fev1 2.2L/73%, ratio 68 (67), TLC 4.7/68%,RV 1.5L/55%,DLCO 79%  . Colon polyps   . Chronic kidney disease, stage III (moderate)   . Chronic diastolic CHF (congestive heart failure)     a) 09/13 ECHO- LVEF 99991111, grade 1 diastolic dysfunction, mild LA dilatation, atrial septal aneurysm, AV mobility restricted, but no sig AS by doppler; b) 09/04/08 ECHO- LVH, ef 60%, mild AS,  . Heart murmur   . Type II diabetes mellitus   . Chronic lower back pain   . RA (rheumatoid arthritis)     [name]  . Myocardial infarction     pci  . Dyspnea 2009 since July -Sept    05/06/08-CPST-  normal effort, reduced VO2 max 20.5 /65%, reduced at 8.2/ 40%, normal breathing resetvca of 55%, submaximal heart rate response 112/77%, flattened o2 pluse response at peak exercise-12 ml/beat @ 85%, No VQ mismatch abnormalities, All c/w CIRC Limitation  . Hiatal hernia   . Anginal pain 02/24/13  . Seropositive rheumatoid arthritis     Past Surgical History  Procedure Laterality Date  . Total knee arthroplasty  03/2010    Right- [name]  . Cholecystectomy  11/2003    [name]- open  . Knee arthroscopy  2008    right  . Cataract extraction w/ intraocular lens  implant, bilateral    . Coronary artery bypass graft  11/1999    CABG X5  . Cardiac catheterization  5/06    CP- no MI, Cath- small vessell disease  . Cardiac catheterization  12/31/2011    80% distal LM, 100% native LAD, LCx and RCA, 30% prox SVG-OM, SVG-D1 normal, 99% distal, 80% ostial SVG-RCA distal to graft, LIMA-LAD normal; LVEF mildly decreased with posterior basal AK  . Ptca  01/03/2012    Successful DES to SVG-RCA and cutting balloon angioplasty ostial  PDA  . Eye surgery    . Coronary stent placement  02/2012    1 stent and balloon  . Shoulder arthroscopy with open rotator cuff repair and distal clavicle acrominectomy Left 02/27/2013    Procedure: LEFT SHOULDER ARTHROSCOPY WITH MINI OPEN ROTATOR CUFF REPAIR AND SUBACROMIAL DECOMPRESSION  AND DISTAL CLAVICLE RESECTION;  Surgeon: [name], MD;  Location: [facility];  Service: Orthopedics;  Laterality: Left;  . Trigger finger release Left 02/27/2013    Procedure: RELEASE TRIGGER FINGER/A-1 PULLEY;  Surgeon: [name], MD;  Location: [facility];  Service: Orthopedics;  Laterality: Left;    Family History  Problem Relation Age of Onset  . COPD Mother   . Heart disease Father   . Diabetes Brother   .  Colon cancer Brother   . Alcohol abuse Sister     History   Social History  . Marital Status: Married    Spouse Name: N/A    Number of Children: N/A  . Years of Education: N/A   Occupational History  . lawn mower     retired   Social History Main Topics  . Smoking status: Former Smoker -- 1.00 packs/day for 20 years    Types: Cigarettes    Quit date: 04/06/1963  . Smokeless tobacco: Never Used  . Alcohol Use: No     Comment: 01/01/2012 "last alcohol ~ 50 yr ago"  . Drug Use: No  . Sexual Activity: Not Currently   Other Topics Concern  . Not on file   Social History Narrative   No living will   Requests wife as health care POA   Discussed DNR --he requests this (done 08/29/12)   Not sure about feeding tube---but might accept for some time   Review of Systems  Constitutional: Positive for unexpected weight change. Negative for fatigue.       Wears seat belt  HENT: Positive for hearing loss. Negative for dental problem.        Regular with dentist   Eyes: Positive for visual disturbance.  Respiratory: Positive for shortness of breath. Negative for cough and chest tightness.   Cardiovascular: Positive for palpitations. Negative for chest pain and leg swelling.  Gastrointestinal: Negative for nausea, vomiting, abdominal pain, constipation and blood in stool.       Recent stomach problems---restarted the PPI  Endocrine: Negative for cold intolerance and heat intolerance.  Genitourinary: Positive for frequency and difficulty urinating.       Some slow  stream with dribbling  Musculoskeletal: Positive for arthralgias and joint swelling. Negative for back pain.  Skin: Negative for rash.  Allergic/Immunologic: Negative for environmental allergies and immunocompromised state.  Neurological: Positive for numbness and headaches. Negative for dizziness, syncope, weakness and light-headedness.       Occ "tiny" headache  Hematological: Negative for adenopathy. Does not bruise/bleed easily.  Psychiatric/Behavioral: Positive for sleep disturbance. Negative for dysphoric mood. The patient is not nervous/anxious.        Objective:   Physical Exam  Constitutional: He is oriented to person, place, and time. He appears well-developed and well-nourished. No distress.  HENT:  Head: Normocephalic and atraumatic.  Right Ear: External ear normal.  Left Ear: External ear normal.  Mouth/Throat: Oropharynx is clear and moist. No oropharyngeal exudate.  Eyes: Conjunctivae and EOM are normal. Pupils are equal, round, and reactive to light.  Neck: Normal range of motion. Neck supple. No thyromegaly present.  Cardiovascular: Normal rate, regular rhythm and intact distal pulses.  Exam reveals no gallop.   Grade 3/6 aortic systolic murmur Faint distal pulses  Pulmonary/Chest: Effort normal. No respiratory distress. He has no wheezes.  Bibasilar dry crackles  Abdominal: Soft. He exhibits no distension. There is no tenderness. There is no rebound and no guarding.  Musculoskeletal:  1+ pitting edema at ankles Mild PIP swelling in hands  Lymphadenopathy:    He has no cervical adenopathy.  Neurological: He is alert and oriented to person, place, and time.  President-- "Obama, Anne Hahn I guess....." [id number] D-l-o-r-w Recall 2/3  Skin: No rash noted. No erythema.  No foot lesions  Psychiatric: He has a normal mood and affect. His behavior is normal.          Assessment & Plan:

## 2013-09-10 NOTE — Assessment & Plan Note (Signed)
[name] is setting him up with renal

## 2013-09-10 NOTE — Assessment & Plan Note (Signed)
Mild symptoms No meds for this 

## 2013-09-10 NOTE — Assessment & Plan Note (Signed)
No apparent symptoms from this May limit exercise tolerance though

## 2013-09-10 NOTE — Assessment & Plan Note (Signed)
Ongoing pain despite high dose gabapentin

## 2013-09-17 ENCOUNTER — Encounter: Payer: Self-pay | Admitting: Internal Medicine

## 2013-09-17 ENCOUNTER — Ambulatory Visit (INDEPENDENT_AMBULATORY_CARE_PROVIDER_SITE_OTHER): Payer: Medicare Other | Admitting: Internal Medicine

## 2013-09-17 VITALS — BP 130/68 | HR 65 | Ht 73.0 in | Wt 230.0 lb

## 2013-09-17 DIAGNOSIS — R0600 Dyspnea, unspecified: Secondary | ICD-10-CM

## 2013-09-17 DIAGNOSIS — I7789 Other specified disorders of arteries and arterioles: Secondary | ICD-10-CM

## 2013-09-17 DIAGNOSIS — R0989 Other specified symptoms and signs involving the circulatory and respiratory systems: Secondary | ICD-10-CM

## 2013-09-17 DIAGNOSIS — R0609 Other forms of dyspnea: Secondary | ICD-10-CM

## 2013-09-17 DIAGNOSIS — J8410 Pulmonary fibrosis, unspecified: Secondary | ICD-10-CM

## 2013-09-17 DIAGNOSIS — J849 Interstitial pulmonary disease, unspecified: Secondary | ICD-10-CM

## 2013-09-17 DIAGNOSIS — M0520 Rheumatoid vasculitis with rheumatoid arthritis of unspecified site: Secondary | ICD-10-CM

## 2013-09-17 DIAGNOSIS — R942 Abnormal results of pulmonary function studies: Secondary | ICD-10-CM

## 2013-09-17 NOTE — Progress Notes (Signed)
Subjective:    Patient ID: [name], male    DOB: February 20, 1932, 80 y.o.   MRN: [id number]  HPI  DYSPNEA - since July-Sept 2009  - Coronary artery disease. s/p CABG x 5 in 2002.  Dyspnea started following lopressor Jan-July 2009 and lisinopril increae July 2009. No relief despite stopping agents July 2010  -   Myoview on April 17, 2007 was  nonischemic with an EF of 51% and inferobasal wall scar.   - CAth Oct 2009:    - . Severe native three-vessel coronary artery disease. 2. Status post multivessel coronary bypass surgery with all grafts  patent. Normal LVEF and LVEDP - PFT - Mixed pattern on spiro. Mild restn on lung volumeswith near normal DLCO.   - Fev1 2.2L/73%, FVC 3.21L/70%, Ratio 68 (67),  TLC 4.7L/68%, RV 1.5L/55%, DLCO 79%.  -CPST 05/06/2008: Normal effot.    - Reduced VO2 max 20.5/65%, REduced AT 8.2/40%, Normal breathing reservce of 55%, submaximal heart rate response 112/77%,  flattened O2 pulse response at peak exercise - 73ml/beat at 85%. No VQ mismatch abnormalities. - ECHO 09/04/2008: LVH, ef 60%, mild AS and unchanged from prior   - CT chest   - Non specific Interstitial Lung disease NOS. Stable pulm infilratates RLL ggo > LLL ggo. 03/2008 -> 10/2008 -> June 2011: stable - Rehab: never attended 2009-2013 due to cost   -  reports that he quit smoking about 50 years ago. His smoking use included Cigarettes. He has a 20 pack-year smoking history. He has never used smokeless tobacco.  OV 10/05/2011 Last seen September 22, 2009 and discharged from followup. He never attended rehab due to cost issues. Now reports that on 09/05/11 noticed abrupt dyspnea worsened from class 1 to 3. Also, edema. No chest pain, hemoptysis. So, 09/09/11 went to ER. Trop, BNP normal. No d-didimer. CXR reported as stable but am wondering if there is increased ILD. Given lasix and now edema better and dyspnea better and > 75% towads baseline  Walking desat test : resting 98/94% -> 3 laps x 185 feet; HR 113/pulse  ox 96%   Past, Family, Social reviewed: no change since last visit in 2 year. No major changes to health in 2 years  REC Do not know why exactly you are more short of breath The main difference I think we notice compared to 2 years ago is the slightly more crackling noise in lung but this is not showing up on walking test or xray Best you have breathing test called PFT Return to see me or NP or DR Halford Chessman  for results of PFt in next 4 weeks   OV 11/22/2011 Followup test results: Dyspnea is unchanged since last. IN review of tests: PFTs July 2013 show worsening restriction since 2010 along with reduced diffusion. The RLL baseline GGO might be worse in July 2013 compared to 2011. In addition, autommune profile shows VERY HIGH TITERS of CCP antibody > 300 which is pathognomonic of rheumtoid arthritis lung involvement. RF is only borderline elevated a 23   PFT 10/12/11>>FEV1 2.02 (70%), Fvc 2.8/72%, TLC 4.07 (59%), DLCO 62%, no BD: RESTRICTION - WORSE since 2010 -(2010:  Fev1 2.2L/73%, FVC 3.21L/70%, Ratio 68 (67),  TLC 4.7L/68%, RV 1.5L/55%, DLCO 79%)   Echo 05/04/11>>mild LVH, EF 55 to 123456, grade 1 diastolic dysfx, mild AS, mild MR, PAS 34 mmHg  CT 11/02/11 - calcification suspicious for aortic valve and some GGO esp RLL > LLL (? RLL worse compared to 2011)  Past, Family, Social reviewed: new problem is that  rT flank and RUQ pain. No associated weight loss but has gained. Come and goes. Mild pain. Unclear what aggravating factors and relieving factors. Annoying quality.    Have blood work today related to lung disease  I will talk to Dr Johnsie Cancel  I will call you back after results are in and after talking to Dr Johnsie Cancel  ...  Update - see phone note 11/26/11. I will send it to Dr Johnsie Cancel   Telephone call 8/22/013 hutoimmune test results: RF and CCP antibodies positive. Esp CCP strongly positive. 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 Dyspnea is from this +/- joint pain that he  might be having. He needs immune     Cardiology office note November 2013  He was admitted 9/27-10/1 with chest pain. Myocardial infarction was ruled out. Cardiac catheterization demonstrated high-grade disease in the SVG-RCA as well as ostial PDA distal to the graft. He had some complications from his diagnostic cath with groin hematoma. He was brought back to the Cath Lab several days later for PCI with DES to the [facility] and cutting PTCA to the [facility]. Dual antiplatelet therapy recommended for one year.  LHC 12/31/11: dLM 80%, LAD 100%, circumflex 100%, RCA 100%, proximal SVG-OM 30%, SVG-D1 ok, SVG-RCA 99% distal and 80% ostial PDA distal to graft, LIMA-LAD ok with some left to right collaterals.  PCI 01/03/12: Promus DES to the SVG-RCA and Cutting Balloon angioplasty to the ostial PDA.  Echocardiogram 12/31/11: EF 99991111, grade 1 diastolic dysfunction, mild aortic stenosis, mean gradient 9, mild LAE.  Labs (10/13): K 4.5, creatinine 1.29, ALT 14, LDL 69, Hgb 12.5  Since d/c, he is doing well. No chest pain. Dyspnea improved. Probably Class IIb. No syncope. No orthopnea, PND, edema   OV 04/27/2012  [name] returns for followup after 6 months. This is for dyspnea out of proportion to his interstitial lung disease. His interstitial lung disease is on the basis of rheumatoid arthritis. Since I last saw him he has seen rheumatologist  Dr [name], who has started him on Imuran and prednisone. He does be that he took the prednisone only for a short time but he continues on Imuran right now. He feels that this intervention only helped his arthritic symptoms which came to light after the diagnosis of serum antibody positivity rheumatoid arthritis. However it does not help his dyspnea. Then in September 2013 J. chest pain and had cardiac catheterization and status post PCI. He followed up with cardiology in November 2013 and was noted to have improved dyspnea but he tells me now that his dyspnea never  really improved. Currently he is dyspneic for simple things like tying his shoelaces, bending over. It is also dyspneic when he is sitting and doing nothing and he feels like he is to take a big deep breath. Is also dyspneic with exertion and relieved with rest. Severity is moderate. Symptoms are unimproved but stable. There is no associated cough. Of note he went to the emergency room early in January 2014 and was given albuterol as needed which he says helps his symptoms. Labs on 04/07/2047 normal. He is really frustrated by his dyspnea.  So far we have never been able to improve his dyspnea. His rheumatologist is concerned about worsening interstitial lung disease  Of note in the past we've had several discussions about attending pulmonary rehabilitation but due to cost factors he is refused  I screened him about depression  and anxiety and he denies this.  Past, Family, Social reviewed: no change since last visit other than noted as above   rec I understand it is frustrating that they've not been able to improve your shortness of breath despite several interventions and new diagnoses  Please repeat pulmonary function testing and if it shows the pulmonary fibrosis is getting worse I will talk to her rheumatologist about changing some medications  - However this is only meant to prevent further worsening and will not improve things from where they are  In order to improve things from where they are you absolutely need pulmonary rehabilitation and I made a referral to them. We will work with them about reducing cost  also start empiric dulera samples; he will opick this up when he comes for PFT  Followup based on our phone conversation of pulmonary function test results   Ov 05/05/2012  [name] returns acute followup.. After his visit last week he did have pulmonary function test on 04/28/2012 when compared to his pre-Imuran pulmonary function test in July 2013 current pulmonary function test  shows worsening in terms of spirometry with improvement in terms of diffusion capacity. Specifically his FEV1 is now 1.77 L/62% and this is down by 12%. His FVC is 2.62 L/59% and this is decreased by 7%. His total lung capacity is 4 L/59% and this is unchanged. His diffusion capacity is 15.7/71% and this is actually improved by 11%. He is started later a few days ago but this has not made any impact in his symptoms.  However yesterday he developed an acute symptomatology of shortness of breath and some nausea and up in the emergency room. Labs are listed below which have reviewed in the look okay. Emergency room services discharge him on Tamiflu and antibiotics. He is currently feeling better. There no new issues other than chronic worsening of dyspnea  Walking desaturation test on 05/05/2012 185 feet x 3 laps:  did NOT desaturate. Rest pulse ox was 98%, final pulse ox was 96%. HR response 69/min at rest to 82/min at peak exertion.    OV 09/17/2013  Chief Complaint  Patient presents with  . Acute Visit    Pt last seen by MR on 04/2012. Pt c/o dyspnea with any activity. Pt states he has very little cough with grey mucous and CP with deep breaths.    Not seen in the past one year. Continues to have dyspnea on exertion. He is on Imuran although medication list does not have on prednisone anymore. He states dyspnea stable. His rheumatologist sent him back here because of persistent dyspnea and if it is class II to class III on exertion. Last pulmonary function tests, CT scan of the chest and oxygen evaluation was over a year ago. There no new problems. His weight has gained back a few pounds according to his history. This was associated with cough. It is exertional and relieved by rest.   PFT FVC fev1 ratio BD fev1 TLC DLCO comment intervention  2010 3.2L/70% 2.2L/73%   4.7L/68% 79%    10/12/11 2.8L/72% 2.02L/70% 72  4.07L/59% 62% Worsening restriction since 2010 Rheum will start immuran/pred. Also had  PCI in nov 2013  04/28/12 2.6L/59% 1.77L/62% 67  4.0L/59% 15.7/71%      Review of Systems  Constitutional: Negative for fever and unexpected weight change.  HENT: Negative for congestion, dental problem, ear pain, nosebleeds, postnasal drip, rhinorrhea, sinus pressure, sneezing, sore throat and trouble swallowing.   Eyes: Negative for  redness and itching.  Respiratory: Positive for cough and shortness of breath. Negative for chest tightness and wheezing.   Cardiovascular: Positive for chest pain and leg swelling. Negative for palpitations.  Gastrointestinal: Negative for nausea and vomiting.  Genitourinary: Negative for dysuria.  Musculoskeletal: Negative for joint swelling.  Skin: Negative for rash.  Neurological: Negative for headaches.  Hematological: Does not bruise/bleed easily.  Psychiatric/Behavioral: Negative for dysphoric mood. The patient is not nervous/anxious.        Objective:   Physical Exam  Nursing note and vitals reviewed. Constitutional: He is oriented to person, place, and time. He appears well-developed and well-nourished. No distress.  HENT:  Head: Normocephalic and atraumatic.  Right Ear: External ear normal.  Left Ear: External ear normal.  Mouth/Throat: Oropharynx is clear and moist. No oropharyngeal exudate.  Eyes: Conjunctivae and EOM are normal. Pupils are equal, round, and reactive to light. Right eye exhibits no discharge. Left eye exhibits no discharge. No scleral icterus.  Neck: Normal range of motion. Neck supple. No JVD present. No tracheal deviation present. No thyromegaly present.  Cardiovascular: Normal rate, regular rhythm and intact distal pulses.  Exam reveals no gallop and no friction rub.   No murmur heard. Pulmonary/Chest: Effort normal. No respiratory distress. He has no wheezes. He has rales. He exhibits no tenderness.  Abdominal: Soft. Bowel sounds are normal. He exhibits no distension and no mass. There is no tenderness. There is no  rebound and no guarding.  Musculoskeletal: Normal range of motion. He exhibits no edema and no tenderness.  Warm hands not deformed  Lymphadenopathy:    He has no cervical adenopathy.  Neurological: He is alert and oriented to person, place, and time. He has normal reflexes. No cranial nerve deficit. Coordination normal.  Skin: Skin is warm and dry. No rash noted. He is not diaphoretic. No erythema. No pallor.  Psychiatric: Judgment normal.     Filed Vitals:   09/17/13 1503  BP: 130/68  Pulse: 65  Height: 6\' 1"  (1.854 m)  Weight: 230 lb (104.327 kg)  SpO2: 95%         Assessment & Plan:  #Shortness of breath and rheumatoid arthritis  - unclear why possibly worse  - High Resolution CT chest without contrast on ILD protocol. Only  Dr Lorin Picket or Dr. Vinnie Langton to read - do full PFT - do ONO test on room air  - do walk test on room air in office (not 50mwd) now or at followup   #FOllowup  - after above in 4 weeks with me or my NP to regroup

## 2013-09-17 NOTE — Patient Instructions (Addendum)
#  Shortness of breath and rheumatoid arthritis  - unclear why possibly worse  - High Resolution CT chest without contrast on ILD protocol. Only  Dr Lorin Picket or Dr. Vinnie Langton to read - do full PFT - do ONO test on room air  - do walk test on room air in office (not 67mwd) now or at followup   #FOllowup  - after above in 4 weeks with me or my NP to regroup

## 2013-09-18 ENCOUNTER — Ambulatory Visit (HOSPITAL_COMMUNITY)
Admission: RE | Admit: 2013-09-18 | Discharge: 2013-09-18 | Disposition: A | Discharge status: Home / Self Care | Payer: Medicare Other | Source: Ambulatory Visit | Attending: Internal Medicine | Admitting: Internal Medicine

## 2013-09-18 DIAGNOSIS — R942 Abnormal results of pulmonary function studies: Secondary | ICD-10-CM

## 2013-09-18 DIAGNOSIS — M0520 Rheumatoid vasculitis with rheumatoid arthritis of unspecified site: Secondary | ICD-10-CM

## 2013-09-18 DIAGNOSIS — R0609 Other forms of dyspnea: Secondary | ICD-10-CM | POA: Insufficient documentation

## 2013-09-18 DIAGNOSIS — R0600 Dyspnea, unspecified: Secondary | ICD-10-CM

## 2013-09-18 DIAGNOSIS — R0989 Other specified symptoms and signs involving the circulatory and respiratory systems: Principal | ICD-10-CM | POA: Insufficient documentation

## 2013-09-18 MED ORDER — ALBUTEROL SULFATE (2.5 MG/3ML) 0.083% IN NEBU
2.5000 mg | INHALATION_SOLUTION | Freq: Once | RESPIRATORY_TRACT | Status: AC
  Administered 2013-09-18: 2.5 mg via RESPIRATORY_TRACT

## 2013-09-19 ENCOUNTER — Ambulatory Visit (INDEPENDENT_AMBULATORY_CARE_PROVIDER_SITE_OTHER)
Admission: RE | Admit: 2013-09-19 | Discharge: 2013-09-19 | Disposition: A | Discharge status: Home / Self Care | Payer: Medicare Other | Source: Ambulatory Visit | Attending: Internal Medicine | Admitting: Internal Medicine

## 2013-09-19 DIAGNOSIS — R0609 Other forms of dyspnea: Secondary | ICD-10-CM

## 2013-09-19 DIAGNOSIS — R942 Abnormal results of pulmonary function studies: Secondary | ICD-10-CM

## 2013-09-19 DIAGNOSIS — M0520 Rheumatoid vasculitis with rheumatoid arthritis of unspecified site: Secondary | ICD-10-CM

## 2013-09-19 DIAGNOSIS — R0989 Other specified symptoms and signs involving the circulatory and respiratory systems: Secondary | ICD-10-CM

## 2013-09-19 DIAGNOSIS — I7789 Other specified disorders of arteries and arterioles: Secondary | ICD-10-CM

## 2013-09-19 DIAGNOSIS — R0600 Dyspnea, unspecified: Secondary | ICD-10-CM

## 2013-09-23 DIAGNOSIS — R0600 Dyspnea, unspecified: Secondary | ICD-10-CM | POA: Insufficient documentation

## 2013-09-23 NOTE — Assessment & Plan Note (Signed)
#  Shortness of breath and rheumatoid arthritis  - unclear why possibly worse  - High Resolution CT chest without contrast on ILD protocol. Only  Dr Lorin Picket or Dr. Vinnie Langton to read - do full PFT - do ONO test on room air  - do walk test on room air in office (not 74mwd) now or at followup   #FOllowup  - after above in 4 weeks with me or my NP to regroup

## 2013-09-28 LAB — PULMONARY FUNCTION TEST
DL/VA % pred: 107 %
DL/VA: 4.99 ml/min/mmHg/L
DLCO UNC: 19.91 ml/min/mmHg
DLCO cor % pred: 59 %
DLCO cor: 19.91 ml/min/mmHg
DLCO unc % pred: 59 %
FEF 25-75 Post: 1.1 L/sec
FEF 25-75 Pre: 1.1 L/sec
FEF2575-%Change-Post: 0 %
FEF2575-%Pred-Post: 53 %
FEF2575-%Pred-Pre: 53 %
FEV1-%CHANGE-POST: 7 %
FEV1-%PRED-POST: 60 %
FEV1-%Pred-Pre: 56 %
FEV1-POST: 1.81 L
FEV1-Pre: 1.69 L
FEV1FVC-%CHANGE-POST: 5 %
FEV1FVC-%Pred-Pre: 91 %
FEV6-%Change-Post: 0 %
FEV6-%PRED-PRE: 65 %
FEV6-%Pred-Post: 65 %
FEV6-POST: 2.56 L
FEV6-PRE: 2.56 L
FEV6FVC-%Change-Post: -1 %
FEV6FVC-%PRED-POST: 105 %
FEV6FVC-%PRED-PRE: 107 %
FVC-%CHANGE-POST: 1 %
FVC-%PRED-PRE: 61 %
FVC-%Pred-Post: 61 %
FVC-Post: 2.6 L
FVC-Pre: 2.57 L
PRE FEV1/FVC RATIO: 66 %
PRE FEV6/FVC RATIO: 100 %
Post FEV1/FVC ratio: 70 %
Post FEV6/FVC ratio: 98 %
RV % PRED: 72 %
RV: 1.95 L
TLC % pred: 64 %
TLC: 4.7 L

## 2013-10-05 ENCOUNTER — Emergency Department (INDEPENDENT_AMBULATORY_CARE_PROVIDER_SITE_OTHER): Payer: Medicare Other

## 2013-10-05 ENCOUNTER — Encounter (HOSPITAL_COMMUNITY): Payer: Self-pay | Admitting: Emergency Medicine

## 2013-10-05 ENCOUNTER — Emergency Department (HOSPITAL_COMMUNITY)
Admission: EM | Admit: 2013-10-05 | Discharge: 2013-10-05 | Disposition: A | Discharge status: Home / Self Care | Payer: Medicare Other | Source: Home / Self Care | Attending: Family Medicine | Admitting: Family Medicine

## 2013-10-05 DIAGNOSIS — T148 Other injury of unspecified body region: Secondary | ICD-10-CM

## 2013-10-05 DIAGNOSIS — W57XXXA Bitten or stung by nonvenomous insect and other nonvenomous arthropods, initial encounter: Secondary | ICD-10-CM

## 2013-10-05 DIAGNOSIS — J45901 Unspecified asthma with (acute) exacerbation: Secondary | ICD-10-CM

## 2013-10-05 DIAGNOSIS — R509 Fever, unspecified: Secondary | ICD-10-CM

## 2013-10-05 MED ORDER — DOXYCYCLINE HYCLATE 100 MG PO CAPS: 100.0000 mg | ORAL_CAPSULE | Freq: Two times a day (BID) | ORAL | Status: DC

## 2013-10-05 MED ORDER — GUAIFENESIN-CODEINE 100-10 MG/5ML PO SOLN: 5.0000 mL | Freq: Every evening | ORAL | Status: DC | PRN

## 2013-10-05 MED ORDER — IPRATROPIUM-ALBUTEROL 0.5-2.5 (3) MG/3ML IN SOLN
RESPIRATORY_TRACT | Status: AC
  Filled 2013-10-05: qty 3

## 2013-10-05 MED ORDER — IPRATROPIUM-ALBUTEROL 0.5-2.5 (3) MG/3ML IN SOLN
3.0000 mL | Freq: Once | RESPIRATORY_TRACT | Status: AC
  Administered 2013-10-05: 3 mL via RESPIRATORY_TRACT

## 2013-10-05 NOTE — ED Notes (Signed)
C/o cough and and fever onset Monday.  Tick bite on back of head 2 weeks.  Wife removed it with tweezers and alcohol.

## 2013-10-05 NOTE — Discharge Instructions (Signed)
Thank you for coming in today. Take the doxycycline twice daily starting tonight.  Come back if worsening or not improving.  Use the cough medicine as needed.  Call or go to the emergency room if you get worse, have trouble breathing, have chest pains, or palpitations.   [place] Spotted Fever [place] Spotted Fever (RMSF) is the oldest known tick-borne disease of people in the [place]. This disease was named because it was first described among people in the [facility] area who had an illness characterized by a rash with red-purple-black spots. This disease is caused by a rickettsia (Rickettsia rickettsii), a bacteria carried by the tick. The [facility] wood tick and the American dog tick, acquire and transmit the RMSF bacteria (pictures NOT actual size). When a larval, nymphal or adult tick feeds on an infected rodent or larger animal, the tick can become infected. Infected adult ticks then feed on people who may then get RMSF. The tick transmits the disease to humans during a prolonged period of feeding that lasts many hours, days or even a couple weeks. The bite is painless and frequently goes unnoticed. An infected male tick may also pass the rickettsial bacteria to her eggs that then may mature to be infected adult ticks. The rickettsia that causes RMSF can also get into a person's body through damaged skin. A tick bite is not necessary. People can get RMSF if they crush a tick and get it's blood or body fluids on their skin through a small cut or sore.  DIAGNOSIS Diagnosis is made by laboratory tests.  TREATMENT Treatment is with antibiotics (medications that kill rickettsia and other bacteria). Immediate treatment usually prevents death. GEOGRAPHIC RANGE This disease was reported only in the [facility] until 1931. RMSF has more recently been described among individuals in all states except [place], [place] and [place]. The highest reported incidences of RMSF now  occur among residents of [place], [place], [place] and the [place]. TIME OF YEAR  Most cases are diagnosed during late spring and summer when ticks are most active. However, especially in the warmer [place] states, a few cases occur during the winter. SYMPTOMS   Symptoms of RMSF begin from 2 to 14 days after a tick bite. The most common early symptoms are fever, muscle aches and headache followed by nausea (feeling sick to your stomach) or vomiting.  The RMSF rash is typically delayed until 3 or more days after symptom onset, and eventually develops in 9 of 10 infected patients by the 5th day of illness. If the disease is not treated it can cause death. If you get a fever, headache, muscle aches, rash, nausea or vomiting within 2 weeks of a possible tick bite or exposure you should see your caregiver immediately. PREVENTION Ticks prefer to hide in shady, moist ground litter. They can often be found above the ground clinging to tall grass, brush, shrubs and low tree branches. They also inhabit lawns and gardens, especially at the edges of woodlands and around old stone walls. Within the areas where ticks generally live, no naturally vegetated area can be considered completely free of infected ticks. The best precaution against RMSF is to avoid contact with soil, leaf litter and vegetation as much as possible in tick infested areas. For those who enjoy gardening or walking in their yards, clear brush and mow tall grass around houses and at the edges of gardens. This may help reduce the tick population in the immediate area. Applications of chemical insecticides  by a licensed professional in the spring (late May) and Fall (September) will also control ticks, especially in heavily infested areas. Treatment will never get rid of all the ticks. Getting rid of small animal populations that host ticks will also decrease the tick population. When working in the garden, [facility], or handling soil and  vegetation, wear light-colored protective clothing and gloves. Spot-check often to prevent ticks from reaching the skin. Ticks cannot jump or fly. They will not drop from an above-ground perch onto a passing animal. Once a tick gains access to human skin it climbs upward until it reaches a more protected area. For example, the back of the knee, groin, navel, armpit, ears or nape of the neck. It then begins the slow process of embedding itself in the skin. Campers, hikers, field workers, and others who spend time in wooded, brushy or tall grassy areas can avoid exposure to ticks by using the following precautions:  Wear light-colored clothing with a tight weave to spot ticks more easily and prevent contact with the skin.  Wear long pants tucked into socks, long-sleeved shirts tucked into pants and enclosed shoes or boots along with insect repellent.  Spray clothes with insect repellent containing either DEET or Permethrin. Only DEET can be used on exposed skin. Follow the manufacturer's directions carefully.  Wear a hat and keep long hair pulled back.  Stay on cleared, well-worn trails whenever possible.  Spot-check yourself and others often for the presence of ticks on clothes. If you find one, there are likely to be others. Check thoroughly.  Remove clothes after leaving tick-infested areas. If possible, wash them to eliminate any unseen ticks. Check yourself, your children and any pets from head to toe for the presence of ticks.  Shower and shampoo. You can greatly reduce your chances of contracting RMSF if you remove attached ticks as soon as possible. Regular checks of the body, including all body sites covered by hair (head, armpits, genitals), allow removal of the tick before rickettsial transmission. To remove an attached tick, use a forceps or tweezers to detach the intact tick without leaving mouth parts in the skin. The tick bite wound should be cleansed after tick removal. Remember the  most common symptoms of RMSF are fever, muscle aches, headache and nausea or vomiting with a later onset of rash. If you get these symptoms after a tick bite and while living in an area where RMSF is found, RMSF should be suspected. If the disease is not treated, it can cause death. See your caregiver immediately if you get these symptoms. Do this even if not aware of a tick bite. Document Released: 07/04/2000 Document Revised: 06/14/2011 Document Reviewed: 02/24/2009 [facility] Patient Information 2015 [place], [place]. This information is not intended to replace advice given to you by your health care provider. Make sure you discuss any questions you have with your health care provider.

## 2013-10-05 NOTE — ED Provider Notes (Signed)
[name] is a 80 y.o. male who presents to Urgent Care today for tick bite cough and congestion. Patient removed a tick from his upper left head 2 weeks ago. Over the last few days he's developed body aches headache and fever. However he also notes cough and congestion associated with some wheezing. He has no vomiting or diarrhea. No chest pains or palpitations. He has not tried any medications yet. He denies any rash.   Past Medical History  Diagnosis Date  . Hyperlipidemia   . Hypertension   . GERD (gastroesophageal reflux disease)   . Arthritis     osteoarthritis, s/p R TKR, and digits  . Overweight (BMI 25.0-29.9)     BMI 29  . Interstitial lung disease     NOS  . Pulmonary infiltrates 12-09    stable, 7/10: ? due to GERD  . Diverticulosis   . Esophageal stricture     s/p dilation spring 2010  . CAD (coronary artery disease)     symptoms followed lopressor Jan-Jul 09 and lisinopril increase July 09, 1/09 Myoview- non ischemic with an EF of 51% and inferobasal wall scar, 10/09 Cath- severe native three-vessel coronary artery disease, s/p multivessel coronary bypass graft with all grafts, Normal LV sys fn with normal LV filling pressures; 10/04-Adenosine cardiolite- thinning wo ischemia, EF 55%; 1/08 Cardiolite neg  . History of PFTs     mixed pattern on spiro. mild restn on lung volumes with near normal DLCO. Pattern can be explained by CABG scar. Fev1 2.2L/73%, ratio 68 (67), TLC 4.7/68%,RV 1.5L/55%,DLCO 79%  . Colon polyps   . Chronic kidney disease, stage III (moderate)   . Chronic diastolic CHF (congestive heart failure)     a) 09/13 ECHO- LVEF 99991111, grade 1 diastolic dysfunction, mild LA dilatation, atrial septal aneurysm, AV mobility restricted, but no sig AS by doppler; b) 09/04/08 ECHO- LVH, ef 60%, mild AS,  . Heart murmur   . Type II diabetes mellitus   . Chronic lower back pain   . RA (rheumatoid arthritis)     [name]  . Myocardial infarction     pci  .  Dyspnea 2009 since July -Sept    05/06/08-[facility]-  normal effort, reduced VO2 max 20.5 /65%, reduced at 8.2/ 40%, normal breathing resetvca of 55%, submaximal heart rate response 112/77%, flattened o2 pluse response at peak exercise-12 ml/beat @ 85%, No VQ mismatch abnormalities, All c/w CIRC Limitation  . Hiatal hernia   . Anginal pain 02/24/13  . Seropositive rheumatoid arthritis    History  Substance Use Topics  . Smoking status: Former Smoker -- 1.00 packs/day for 20 years    Types: Cigarettes    Quit date: 04/06/1963  . Smokeless tobacco: Never Used  . Alcohol Use: No     Comment: 01/01/2012 "last alcohol ~ 50 yr ago"   ROS as above Medications: No current facility-administered medications for this encounter.   Current Outpatient Prescriptions  Medication Sig Dispense Refill  . amLODipine (NORVASC) 10 MG tablet Take 10 mg by mouth daily.      [name] aspirin 81 MG tablet Take 81 mg by mouth daily.        [name] azaTHIOprine (IMURAN) 50 MG tablet Take 50 mg by mouth 2 (two) times daily.      . carvedilol (COREG) 12.5 MG tablet Take 1 tablet (12.5 mg total) by mouth 2 (two) times daily with a meal.  60 tablet  3  . DHA-Vitamin C-Lutein ([place]  PO) Take by mouth.      . furosemide (LASIX) 40 MG tablet Take 1-2 tablets (40-80 mg total) by mouth daily.  60 tablet  11  . gabapentin (NEURONTIN) 300 MG capsule Take 3 tablets by mouth in the morning and 4-5 tablets at bedtime      . isosorbide mononitrate (IMDUR) 30 MG 24 hr tablet Take 1 tablet (30 mg total) by mouth daily.  30 tablet  6  . losartan (COZAAR) 100 MG tablet TAKE 1 TABLET (100 MG TOTAL) BY MOUTH DAILY.  90 tablet  3  . metFORMIN (GLUCOPHAGE) 1000 MG tablet Take 1,000 mg by mouth 2 (two) times daily with a meal.      . Multiple Vitamins-Minerals (ICAPS) CAPS Take 1 capsule by mouth daily.       . nitroGLYCERIN (NITROSTAT) 0.4 MG SL tablet Place 1 tablet (0.4 mg total) under the tongue every 5 (five) minutes as needed. Chest pain   25 tablet  4  . nortriptyline (PAMELOR) 25 MG capsule Take 1-2 capsules (25-50 mg total) by mouth at bedtime.  60 capsule  11  . omeprazole (PRILOSEC OTC) 20 MG tablet Take 20 mg by mouth daily.        . pravastatin (PRAVACHOL) 40 MG tablet Take 40 mg by mouth daily.      . vitamin B-12 (CYANOCOBALAMIN) 1000 MCG tablet Take 1,000 mcg by mouth daily.      [name] doxycycline (VIBRAMYCIN) 100 MG capsule Take 1 capsule (100 mg total) by mouth 2 (two) times daily.  20 capsule  0  . guaiFENesin-codeine 100-10 MG/5ML syrup Take 5 mLs by mouth at bedtime as needed for cough.  120 mL  0    Exam:  BP 140/65  Pulse 70  Temp(Src) 98.4 F (36.9 C) (Oral)  Resp 20  SpO2 97% Gen: Well NAD HEENT: EOMI,  MMM Lungs: Normal work of breathing. Crackles right lower lung Heart: RRR no MRG Abd: NABS, Soft. NT, ND Exts: Brisk capillary refill, warm and well perfused.  Skin: No rash  Patient was given a DuoNeb nebulizer treatment which did not help.  No results found for this or any previous visit (from the past 24 hour(s)). Dg Chest 2 View  10/05/2013   CLINICAL DATA:  Cough and wheezing and fever; history of coronary artery disease, CABG, and stent placement.  EXAM: CHEST  2 VIEW  COMPARISON:  PA and lateral chest x-ray of May 11, 2013 and CT scan of the chest of September 19, 2013.  FINDINGS: The lungs are mildly hyperinflated. The interstitial markings are coarse in the lower lung zones. There is no alveolar infiltrate or pulmonary edema. The heart is normal in size. The patient has undergone previous CABG. The bony thorax exhibits calcification of the anterior longitudinal ligament.  IMPRESSION: There is no evidence of pneumonia nor CHF nor other acute cardiopulmonary abnormality.   Electronically Signed   By: David  Martinique   On: 10/05/2013 17:47    Assessment and Plan: 80 y.o. male with tick bite with fever headache and body aches. This is concerning for [facility] not spotted fever.  Will treat with  doxycycline. Patient additionally has a cough. This is likely more related to his pre-existing interstitial lung disease. He does not have evidence for pneumonia or COPD or asthma exacerbation. Plan to treat with codeine containing cough medication. Followup with primary care provider if worsening.  Discussed warning signs or symptoms. Please see discharge instructions. Patient expresses understanding.  Gregor Hams, MD 10/05/13 [phone]

## 2013-10-09 ENCOUNTER — Other Ambulatory Visit: Payer: Self-pay | Admitting: Cardiovascular Disease

## 2013-10-10 ENCOUNTER — Encounter (HOSPITAL_COMMUNITY): Payer: Self-pay | Admitting: Emergency Medicine

## 2013-10-10 ENCOUNTER — Emergency Department (INDEPENDENT_AMBULATORY_CARE_PROVIDER_SITE_OTHER)
Admission: EM | Admit: 2013-10-10 | Discharge: 2013-10-10 | Disposition: A | Discharge status: Home / Self Care | Payer: Medicare Other | Source: Home / Self Care

## 2013-10-10 DIAGNOSIS — J3089 Other allergic rhinitis: Secondary | ICD-10-CM

## 2013-10-10 DIAGNOSIS — R059 Cough, unspecified: Secondary | ICD-10-CM

## 2013-10-10 DIAGNOSIS — R0982 Postnasal drip: Secondary | ICD-10-CM

## 2013-10-10 DIAGNOSIS — J8410 Pulmonary fibrosis, unspecified: Secondary | ICD-10-CM

## 2013-10-10 DIAGNOSIS — J849 Interstitial pulmonary disease, unspecified: Secondary | ICD-10-CM

## 2013-10-10 DIAGNOSIS — R05 Cough: Secondary | ICD-10-CM

## 2013-10-10 DIAGNOSIS — J302 Other seasonal allergic rhinitis: Secondary | ICD-10-CM

## 2013-10-10 NOTE — ED Provider Notes (Signed)
Medical screening examination/treatment/procedure(s) were performed by resident physician or non-physician practitioner and as supervising physician I was immediately available for consultation/collaboration.   Pauline Good MD.   Billy Fischer, MD 10/10/13 2059

## 2013-10-10 NOTE — ED Notes (Signed)
Pt  Seen  5  Days  Ago  For  Tick  Fever        He  Reports    Was  Given  meds   Still  Has  Symptoms  Of  Cough   Weakness      Lightheaded  And  Dizzy         He  Reports  Has  Been  worrking  Outside  In sun  Today as  Well

## 2013-10-10 NOTE — Discharge Instructions (Signed)
Cough, Adult  A cough is a reflex that helps clear your throat and airways. It can help heal the body or may be a reaction to an irritated airway. A cough may only last 2 or 3 weeks (acute) or may last more than 8 weeks (chronic).  CAUSES Acute cough:  Viral or bacterial infections. Chronic cough:  Infections.  Allergies.  Asthma.  Post-nasal drip.  Smoking.  Heartburn or acid reflux.  Some medicines.  Chronic lung problems (COPD).  Cancer. SYMPTOMS   Cough.  Fever.  Chest pain.  Increased breathing rate.  High-pitched whistling sound when breathing (wheezing).  Colored mucus that you cough up (sputum). TREATMENT   A bacterial cough may be treated with antibiotic medicine.  A viral cough must run its course and will not respond to antibiotics.  Your caregiver may recommend other treatments if you have a chronic cough. HOME CARE INSTRUCTIONS   Only take over-the-counter or prescription medicines for pain, discomfort, or fever as directed by your caregiver. Use cough suppressants only as directed by your caregiver.  Use a cold steam vaporizer or humidifier in your bedroom or home to help loosen secretions.  Sleep in a semi-upright position if your cough is worse at night.  Rest as needed.  Stop smoking if you smoke. SEEK IMMEDIATE MEDICAL CARE IF:   You have pus in your sputum.  Your cough starts to worsen.  You cannot control your cough with suppressants and are losing sleep.  You begin coughing up blood.  You have difficulty breathing.  You develop pain which is getting worse or is uncontrolled with medicine.  You have a fever. MAKE SURE YOU:   Understand these instructions.  Will watch your condition.  Will get help right away if you are not doing well or get worse. Document Released: 09/18/2010 Document Revised: 06/14/2011 Document Reviewed: 09/18/2010 [facility] Patient Information 2015 [place], [place]. This information is not intended  to replace advice given to you by your health care provider. Make sure you discuss any questions you have with your health care provider.  Allergic Rhinitis Allegra 180 mg a day or 60 mg twice a day. Allergic rhinitis is when the mucous membranes in the nose respond to allergens. Allergens are particles in the air that cause your body to have an allergic reaction. This causes you to release allergic antibodies. Through a chain of events, these eventually cause you to release histamine into the blood stream. Although meant to protect the body, it is this release of histamine that causes your discomfort, such as frequent sneezing, congestion, and an itchy, runny nose.  CAUSES  Seasonal allergic rhinitis (hay fever) is caused by pollen allergens that may come from grasses, trees, and weeds. Year-round allergic rhinitis (perennial allergic rhinitis) is caused by allergens such as house dust mites, pet dander, and mold spores.  SYMPTOMS   Nasal stuffiness (congestion).  Itchy, runny nose with sneezing and tearing of the eyes. DIAGNOSIS  Your health care provider can help you determine the allergen or allergens that trigger your symptoms. If you and your health care provider are unable to determine the allergen, skin or blood testing may be used. TREATMENT  Allergic rhinitis does not have a cure, but it can be controlled by:  Medicines and allergy shots (immunotherapy).  Avoiding the allergen. Hay fever may often be treated with antihistamines in pill or nasal spray forms. Antihistamines block the effects of histamine. There are over-the-counter medicines that may help with nasal congestion and swelling  around the eyes. Check with your health care provider before taking or giving this medicine.  If avoiding the allergen or the medicine prescribed do not work, there are many new medicines your health care provider can prescribe. Stronger medicine may be used if initial measures are ineffective.  Desensitizing injections can be used if medicine and avoidance does not work. Desensitization is when a patient is given ongoing shots until the body becomes less sensitive to the allergen. Make sure you follow up with your health care provider if problems continue. HOME CARE INSTRUCTIONS It is not possible to completely avoid allergens, but you can reduce your symptoms by taking steps to limit your exposure to them. It helps to know exactly what you are allergic to so that you can avoid your specific triggers. SEEK MEDICAL CARE IF:   You have a fever.  You develop a cough that does not stop easily (persistent).  You have shortness of breath.  You start wheezing.  Symptoms interfere with normal daily activities. Document Released: 12/15/2000 Document Revised: 03/27/2013 Document Reviewed: 11/27/2012 [facility] Patient Information 2015 [place], [place]. This information is not intended to replace advice given to you by your health care provider. Make sure you discuss any questions you have with your health care provider.

## 2013-10-10 NOTE — ED Provider Notes (Signed)
CSN: [id number]     Arrival date & time 10/10/13  1306 History   None    Chief Complaint  Patient presents with  . Follow-up   (Consider location/radiation/quality/duration/timing/severity/associated sxs/prior Treatment) HPI Comments: Returns to UC for F/U after seen here abut a weeka ago for congestion and cough and fever. Tx for possible Tick fever since he had removed a tick recently.  Feeling better, no fever, chills. Continues with a cough and upper airway congestion.    Past Medical History  Diagnosis Date  . Hyperlipidemia   . Hypertension   . GERD (gastroesophageal reflux disease)   . Arthritis     osteoarthritis, s/p R TKR, and digits  . Overweight (BMI 25.0-29.9)     BMI 29  . Interstitial lung disease     NOS  . Pulmonary infiltrates 12-09    stable, 7/10: ? due to GERD  . Diverticulosis   . Esophageal stricture     s/p dilation spring 2010  . CAD (coronary artery disease)     symptoms followed lopressor Jan-Jul 09 and lisinopril increase July 09, 1/09 Myoview- non ischemic with an EF of 51% and inferobasal wall scar, 10/09 Cath- severe native three-vessel coronary artery disease, s/p multivessel coronary bypass graft with all grafts, Normal LV sys fn with normal LV filling pressures; 10/04-Adenosine cardiolite- thinning wo ischemia, EF 55%; 1/08 Cardiolite neg  . History of PFTs     mixed pattern on spiro. mild restn on lung volumes with near normal DLCO. Pattern can be explained by CABG scar. Fev1 2.2L/73%, ratio 68 (67), TLC 4.7/68%,RV 1.5L/55%,DLCO 79%  . Colon polyps   . Chronic kidney disease, stage III (moderate)   . Chronic diastolic CHF (congestive heart failure)     a) 09/13 ECHO- LVEF 99991111, grade 1 diastolic dysfunction, mild LA dilatation, atrial septal aneurysm, AV mobility restricted, but no sig AS by doppler; b) 09/04/08 ECHO- LVH, ef 60%, mild AS,  . Heart murmur   . Type II diabetes mellitus   . Chronic lower back pain   . RA (rheumatoid arthritis)      [name]  . Myocardial infarction     pci  . Dyspnea 2009 since July -Sept    05/06/08-CPST-  normal effort, reduced VO2 max 20.5 /65%, reduced at 8.2/ 40%, normal breathing resetvca of 55%, submaximal heart rate response 112/77%, flattened o2 pluse response at peak exercise-12 ml/beat @ 85%, No VQ mismatch abnormalities, All c/w CIRC Limitation  . Hiatal hernia   . Anginal pain 02/24/13  . Seropositive rheumatoid arthritis    Past Surgical History  Procedure Laterality Date  . Total knee arthroplasty  03/2010    Right- [name]  . Cholecystectomy  11/2003    [name]- open  . Knee arthroscopy  2008    right  . Cataract extraction w/ intraocular lens  implant, bilateral    . Coronary artery bypass graft  11/1999    CABG X5  . Cardiac catheterization  5/06    CP- no MI, Cath- small vessell disease  . Cardiac catheterization  12/31/2011    80% distal LM, 100% native LAD, LCx and RCA, 30% prox SVG-OM, SVG-D1 normal, 99% distal, 80% ostial SVG-RCA distal to graft, LIMA-LAD normal; LVEF mildly decreased with posterior basal AK  . Ptca  01/03/2012    Successful DES to SVG-RCA and cutting balloon angioplasty ostial  PDA  . Eye surgery    . Coronary stent placement  02/2012    1 stent  and balloon  . Shoulder arthroscopy with open rotator cuff repair and distal clavicle acrominectomy Left 02/27/2013    Procedure: LEFT SHOULDER ARTHROSCOPY WITH MINI OPEN ROTATOR CUFF REPAIR AND SUBACROMIAL DECOMPRESSION AND DISTAL CLAVICLE RESECTION;  Surgeon: [name], MD;  Location: [place];  Service: Orthopedics;  Laterality: Left;  . Trigger finger release Left 02/27/2013    Procedure: RELEASE TRIGGER FINGER/A-1 PULLEY;  Surgeon: [name], MD;  Location: [place];  Service: Orthopedics;  Laterality: Left;   Family History  Problem Relation Age of Onset  . COPD Mother   . Heart disease Father   . Diabetes Brother   . Colon cancer Brother   . Alcohol abuse Sister    History  Substance  Use Topics  . Smoking status: Former Smoker -- 1.00 packs/day for 20 years    Types: Cigarettes    Quit date: 04/06/1963  . Smokeless tobacco: Never Used  . Alcohol Use: No     Comment: 01/01/2012 "last alcohol ~ 50 yr ago"    Review of Systems  Constitutional: Negative.   HENT: Positive for congestion and postnasal drip. Negative for sore throat.   Respiratory: Positive for cough and shortness of breath.   Cardiovascular: Negative.   Skin: Negative.   Neurological: Negative.     Allergies  Doxazosin mesylate and Methocarbamol  Home Medications   Prior to Admission medications   Medication Sig Start Date End Date Taking? Authorizing Provider  amLODipine (NORVASC) 10 MG tablet Take 10 mg by mouth daily.    Historical Provider, MD  aspirin 81 MG tablet Take 81 mg by mouth daily.      Historical Provider, MD  azaTHIOprine (IMURAN) 50 MG tablet Take 50 mg by mouth 2 (two) times daily.    Historical Provider, MD  carvedilol (COREG) 12.5 MG tablet TAKE 1 TABLET BY MOUTH TWICE A DAY WITH A MEAL    [name], MD  DHA-Vitamin C-Lutein (EYE HEALTH FORMULA PO) Take by mouth.    Historical Provider, MD  doxycycline (VIBRAMYCIN) 100 MG capsule Take 1 capsule (100 mg total) by mouth 2 (two) times daily. 10/05/13   Gregor Hams, MD  furosemide (LASIX) 40 MG tablet Take 1-2 tablets (40-80 mg total) by mouth daily. 05/29/13   [name], MD  gabapentin (NEURONTIN) 300 MG capsule Take 3 tablets by mouth in the morning and 4-5 tablets at bedtime 10/03/12   [name], MD  guaiFENesin-codeine 100-10 MG/5ML syrup Take 5 mLs by mouth at bedtime as needed for cough. 10/05/13   Gregor Hams, MD  isosorbide mononitrate (IMDUR) 30 MG 24 hr tablet Take 1 tablet (30 mg total) by mouth daily. 02/25/13   Candee Furbish, MD  losartan (COZAAR) 100 MG tablet TAKE 1 TABLET (100 MG TOTAL) BY MOUTH DAILY. 03/28/13   [name], MD  metFORMIN (GLUCOPHAGE) 1000 MG tablet Take 1,000 mg by mouth 2 (two)  times daily with a meal.    Historical Provider, MD  Multiple Vitamins-Minerals (ICAPS) CAPS Take 1 capsule by mouth daily.     Historical Provider, MD  nitroGLYCERIN (NITROSTAT) 0.4 MG SL tablet Place 1 tablet (0.4 mg total) under the tongue every 5 (five) minutes as needed. Chest pain 12/26/12   [name], MD  nortriptyline (PAMELOR) 25 MG capsule Take 1-2 capsules (25-50 mg total) by mouth at bedtime. 12/13/12   [name], MD  omeprazole (PRILOSEC OTC) 20 MG tablet Take 20 mg by mouth daily.  Historical Provider, MD  pravastatin (PRAVACHOL) 40 MG tablet Take 40 mg by mouth daily.    Historical Provider, MD  vitamin B-12 (CYANOCOBALAMIN) 1000 MCG tablet Take 1,000 mcg by mouth daily.    Historical Provider, MD   BP 119/67  Pulse 60  Temp(Src) 97.2 F (36.2 C) (Oral)  Resp 20  SpO2 93% Physical Exam  Nursing note and vitals reviewed. Constitutional: He is oriented to person, place, and time. He appears well-developed and well-nourished. No distress.  HENT:  Mouth/Throat: No oropharyngeal exudate.  OP with PND, cobblestoning , mild redness.  Eyes: Conjunctivae and EOM are normal.  Neck: Normal range of motion.  Cardiovascular: Normal rate and regular rhythm.   Murmur heard. Pulmonary/Chest: Effort normal.  Chronic bibasilar rales.  Clear otherwise, no wheeze  Lymphadenopathy:    He has no cervical adenopathy.  Neurological: He is alert and oriented to person, place, and time.  Skin: Skin is warm and dry.    ED Course  Procedures (including critical care time) Labs Review Labs Reviewed - No data to display  Imaging Review No results found.   MDM   1. Cough   2. Interstitial lung disease   3. PND (post-nasal drip)   4. Other seasonal allergic rhinitis    Allegra 180 q d After see your pulmonologist next week he can decide if need alb HFA Stable, afebrile and at base line.       Janne Napoleon, NP 10/10/13 1515

## 2013-10-13 ENCOUNTER — Other Ambulatory Visit: Payer: Self-pay | Admitting: Cardiology

## 2013-10-13 ENCOUNTER — Telehealth: Payer: Self-pay | Admitting: Internal Medicine

## 2013-10-13 NOTE — Telephone Encounter (Signed)
Tammt  He has no clear cut ILD findings just some vague densitiies but has been hampered by dyspnea for long time - suspect dyspnea, CAD, decodnitioning. I even diagnosed RA in him and sent him to rheum but has not helped.   He came back to  See me and i did bunch of tests   - CT shows some sclerotic aortic valve: might need echo   - ONO 09/19/13 - ONO 09/19/13 shows pilse ox </= 88% at 33 min 44 sec. He does not desat with walking. So, will need o2 at nighjt   - PFT - is stable and CT lung - nil specific  So, I am thinking next he needs to have echo. Please asess for fu CT timing if needed  Dr. Brand Males, M.D., [facility].C.P Pulmonary and Critical Care Medicine Staff Physician [place] [facility] Pager: [phone], If no answer or between  15:00h - 7:00h: call [phone]  10/13/2013 5:01 PM

## 2013-10-15 ENCOUNTER — Encounter: Payer: Self-pay | Admitting: Adult Health

## 2013-10-15 ENCOUNTER — Ambulatory Visit (INDEPENDENT_AMBULATORY_CARE_PROVIDER_SITE_OTHER): Payer: Medicare Other | Admitting: Adult Health

## 2013-10-15 VITALS — BP 134/66 | HR 74 | Temp 97.3°F | Ht 73.0 in | Wt 227.0 lb

## 2013-10-15 DIAGNOSIS — R0989 Other specified symptoms and signs involving the circulatory and respiratory systems: Secondary | ICD-10-CM

## 2013-10-15 DIAGNOSIS — J849 Interstitial pulmonary disease, unspecified: Secondary | ICD-10-CM

## 2013-10-15 DIAGNOSIS — R0600 Dyspnea, unspecified: Secondary | ICD-10-CM

## 2013-10-15 DIAGNOSIS — R0609 Other forms of dyspnea: Secondary | ICD-10-CM

## 2013-10-15 DIAGNOSIS — I5032 Chronic diastolic (congestive) heart failure: Secondary | ICD-10-CM

## 2013-10-15 DIAGNOSIS — J8410 Pulmonary fibrosis, unspecified: Secondary | ICD-10-CM

## 2013-10-15 NOTE — Progress Notes (Signed)
Subjective:    Patient ID: [name], male    DOB: Aug 27, 1931, 80 y.o.   MRN: [id number]  HPI  DYSPNEA - since July-Sept 2009  - Coronary artery disease. s/p CABG x 5 in 2002.  Dyspnea started following lopressor Jan-July 2009 and lisinopril increae July 2009. No relief despite stopping agents July 2010  -   Myoview on April 17, 2007 was  nonischemic with an EF of 51% and inferobasal wall scar.   - CAth Oct 2009:    - . Severe native three-vessel coronary artery disease. 2. Status post multivessel coronary bypass surgery with all grafts  patent. Normal LVEF and LVEDP - PFT - Mixed pattern on spiro. Mild restn on lung volumeswith near normal DLCO.   - Fev1 2.2L/73%, FVC 3.21L/70%, Ratio 68 (67),  TLC 4.7L/68%, RV 1.5L/55%, DLCO 79%.  -CPST 05/06/2008: Normal effot.    - Reduced VO2 max 20.5/65%, REduced AT 8.2/40%, Normal breathing reservce of 55%, submaximal heart rate response 112/77%,  flattened O2 pulse response at peak exercise - 38ml/beat at 85%. No VQ mismatch abnormalities. - ECHO 09/04/2008: LVH, ef 60%, mild AS and unchanged from prior   - CT chest   - Non specific Interstitial Lung disease NOS. Stable pulm infilratates RLL ggo > LLL ggo. 03/2008 -> 10/2008 -> June 2011: stable - Rehab: never attended 2009-2013 due to cost   -  reports that he quit smoking about 50 years ago. His smoking use included Cigarettes. He has a 20 pack-year smoking history. He has never used smokeless tobacco.  OV 10/05/2011 Last seen September 22, 2009 and discharged from followup. He never attended rehab due to cost issues. Now reports that on 09/05/11 noticed abrupt dyspnea worsened from class 1 to 3. Also, edema. No chest pain, hemoptysis. So, 09/09/11 went to ER. Trop, BNP normal. No d-didimer. CXR reported as stable but am wondering if there is increased ILD. Given lasix and now edema better and dyspnea better and > 75% towads baseline  Walking desat test : resting 98/94% -> 3 laps x 185 feet; HR 113/pulse  ox 96%   Past, Family, Social reviewed: no change since last visit in 2 year. No major changes to health in 2 years  REC Do not know why exactly you are more short of breath The main difference I think we notice compared to 2 years ago is the slightly more crackling noise in lung but this is not showing up on walking test or xray Best you have breathing test called PFT Return to see me or NP or DR Halford Chessman  for results of PFt in next 4 weeks   OV 11/22/2011 Followup test results: Dyspnea is unchanged since last. IN review of tests: PFTs July 2013 show worsening restriction since 2010 along with reduced diffusion. The RLL baseline GGO might be worse in July 2013 compared to 2011. In addition, autommune profile shows VERY HIGH TITERS of CCP antibody > 300 which is pathognomonic of rheumtoid arthritis lung involvement. RF is only borderline elevated a 23   PFT 10/12/11>>FEV1 2.02 (70%), Fvc 2.8/72%, TLC 4.07 (59%), DLCO 62%, no BD: RESTRICTION - WORSE since 2010 -(2010:  Fev1 2.2L/73%, FVC 3.21L/70%, Ratio 68 (67),  TLC 4.7L/68%, RV 1.5L/55%, DLCO 79%)   Echo 05/04/11>>mild LVH, EF 55 to 123456, grade 1 diastolic dysfx, mild AS, mild MR, PAS 34 mmHg  CT 11/02/11 - calcification suspicious for aortic valve and some GGO esp RLL > LLL (? RLL worse compared to 2011)  Past, Family, Social reviewed: new problem is that  rT flank and RUQ pain. No associated weight loss but has gained. Come and goes. Mild pain. Unclear what aggravating factors and relieving factors. Annoying quality.    Have blood work today related to lung disease  I will talk to Dr Johnsie Cancel  I will call you back after results are in and after talking to Dr Johnsie Cancel  ...  Update - see phone note 11/26/11. I will send it to Dr Johnsie Cancel   Telephone call 8/22/013 hutoimmune test results: RF and CCP antibodies positive. Esp CCP strongly positive. 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 Dyspnea is from this +/- joint pain that he  might be having. He needs immune     Cardiology office note November 2013  He was admitted 9/27-10/1 with chest pain. Myocardial infarction was ruled out. Cardiac catheterization demonstrated high-grade disease in the SVG-RCA as well as ostial PDA distal to the graft. He had some complications from his diagnostic cath with groin hematoma. He was brought back to the Cath Lab several days later for PCI with DES to the [facility] and cutting PTCA to the [place]. Dual antiplatelet therapy recommended for one year.  LHC 12/31/11: dLM 80%, LAD 100%, circumflex 100%, RCA 100%, proximal SVG-OM 30%, SVG-D1 ok, SVG-RCA 99% distal and 80% ostial PDA distal to graft, LIMA-LAD ok with some left to right collaterals.  PCI 01/03/12: Promus DES to the SVG-RCA and Cutting Balloon angioplasty to the ostial PDA.  Echocardiogram 12/31/11: EF 99991111, grade 1 diastolic dysfunction, mild aortic stenosis, mean gradient 9, mild LAE.  Labs (10/13): K 4.5, creatinine 1.29, ALT 14, LDL 69, Hgb 12.5  Since d/c, he is doing well. No chest pain. Dyspnea improved. Probably Class IIb. No syncope. No orthopnea, PND, edema   OV 04/27/2012  [name] returns for followup after 6 months. This is for dyspnea out of proportion to his interstitial lung disease. His interstitial lung disease is on the basis of rheumatoid arthritis. Since I last saw him he has seen rheumatologist  Dr [name], who has started him on Imuran and prednisone. He does be that he took the prednisone only for a short time but he continues on Imuran right now. He feels that this intervention only helped his arthritic symptoms which came to light after the diagnosis of serum antibody positivity rheumatoid arthritis. However it does not help his dyspnea. Then in September 2013 J. chest pain and had cardiac catheterization and status post PCI. He followed up with cardiology in November 2013 and was noted to have improved dyspnea but he tells me now that his dyspnea never  really improved. Currently he is dyspneic for simple things like tying his shoelaces, bending over. It is also dyspneic when he is sitting and doing nothing and he feels like he is to take a big deep breath. Is also dyspneic with exertion and relieved with rest. Severity is moderate. Symptoms are unimproved but stable. There is no associated cough. Of note he went to the emergency room early in January 2014 and was given albuterol as needed which he says helps his symptoms. Labs on 04/07/2047 normal. He is really frustrated by his dyspnea.  So far we have never been able to improve his dyspnea. His rheumatologist is concerned about worsening interstitial lung disease  Of note in the past we've had several discussions about attending pulmonary rehabilitation but due to cost factors he is refused  I screened him about depression  and anxiety and he denies this.  Past, Family, Social reviewed: no change since last visit other than noted as above   rec I understand it is frustrating that they've not been able to improve your shortness of breath despite several interventions and new diagnoses  Please repeat pulmonary function testing and if it shows the pulmonary fibrosis is getting worse I will talk to her rheumatologist about changing some medications  - However this is only meant to prevent further worsening and will not improve things from where they are  In order to improve things from where they are you absolutely need pulmonary rehabilitation and I made a referral to them. We will work with them about reducing cost  also start empiric dulera samples; he will opick this up when he comes for PFT  Followup based on our phone conversation of pulmonary function test results   Ov 05/05/2012  [name] returns acute followup.. After his visit last week he did have pulmonary function test on 04/28/2012 when compared to his pre-Imuran pulmonary function test in July 2013 current pulmonary function test  shows worsening in terms of spirometry with improvement in terms of diffusion capacity. Specifically his FEV1 is now 1.77 L/62% and this is down by 12%. His FVC is 2.62 L/59% and this is decreased by 7%. His total lung capacity is 4 L/59% and this is unchanged. His diffusion capacity is 15.7/71% and this is actually improved by 11%. He is started later a few days ago but this has not made any impact in his symptoms.  However yesterday he developed an acute symptomatology of shortness of breath and some nausea and up in the emergency room. Labs are listed below which have reviewed in the look okay. Emergency room services discharge him on Tamiflu and antibiotics. He is currently feeling better. There no new issues other than chronic worsening of dyspnea  Walking desaturation test on 05/05/2012 185 feet x 3 laps:  did NOT desaturate. Rest pulse ox was 98%, final pulse ox was 96%. HR response 69/min at rest to 82/min at peak exertion.    OV 09/17/2013  Chief Complaint  Patient presents with  . Acute Visit    Pt last seen by MR on 04/2012. Pt c/o dyspnea with any activity. Pt states he has very little cough with grey mucous and CP with deep breaths.    Not seen in the past one year. Continues to have dyspnea on exertion. He is on Imuran although medication list does not have on prednisone anymore. He states dyspnea stable. His rheumatologist sent him back here because of persistent dyspnea and if it is class II to class III on exertion. Last pulmonary function tests, CT scan of the chest and oxygen evaluation was over a year ago. There no new problems. His weight has gained back a few pounds according to his history. This was associated with cough. It is exertional and relieved by rest.   PFT FVC fev1 ratio BD fev1 TLC DLCO comment intervention  2010 3.2L/70% 2.2L/73%   4.7L/68% 79%    10/12/11 2.8L/72% 2.02L/70% 72  4.07L/59% 62% Worsening restriction since 2010 Rheum will start immuran/pred. Also had  PCI in nov 2013  04/28/12 2.6L/59% 1.77L/62% 67  4.0L/59% 15.7/71%     10/15/2013 Follow up  MR pt here for 4 week follow up .   Reports breathing has been doing well, no new complaints.  Will finish Doxycycline 7/14 for [facility] Spotted Fever (was exposed to tick bite)  He has had several tests since last ov , we discussed and reviewed the following.  CT chest showed bronchiectasis w/ no ILD  ONO showed some desats .  Denies daytimes sleepiness or snoring. Previous abg w/ no hypercarbia noted 2013 .  No desats w/ ambulation  PFT showed no significant change with FEV1 56% , ratio 66,  FVC 61%.  Denies hemoptysis , chest pain, orthopnea, edema or fever.      Review of Systems  Constitutional: Negative for fever and unexpected weight change.  HENT: Negative for congestion, dental problem, ear pain, nosebleeds, postnasal drip, rhinorrhea, sinus pressure, sneezing, sore throat and trouble swallowing.   Eyes: Negative for redness and itching.  Respiratory: Positive for cough and shortness of breath. Negative for chest tightness and wheezing.   Cardiovascular:  Negative for palpitations.  Gastrointestinal: Negative for nausea and vomiting.  Genitourinary: Negative for dysuria.  Musculoskeletal: Negative for joint swelling.  Skin: Negative for rash.  Neurological: Negative for headaches.  Hematological: Does not bruise/bleed easily.  Psychiatric/Behavioral: Negative for dysphoric mood. The patient is not nervous/anxious.        Objective:   Physical Exam  GEN: A/Ox3; pleasant , NAD, elderly obese   HEENT:  [place]/AT,  EACs-clear, TMs-wnl, NOSE-clear, THROAT-clear, no lesions, no postnasal drip or exudate noted.   NECK:  Supple w/ fair ROM; no JVD; normal carotid impulses w/o bruits; no thyromegaly or nodules palpated; no lymphadenopathy.  RESP  Decreased BS in bases , w/o, wheezes/ rales/ or rhonchi.no accessory muscle use, no dullness to percussion  CARD:  RRR, no m/r/g  ,tr   peripheral edema, pulses intact, no cyanosis or clubbing.  GI:   Soft & nt; nml bowel sounds; no organomegaly or masses detected.  Musco: Warm bil, no deformities or joint swelling noted.   Neuro: alert, no focal deficits noted.    Skin: Warm, no lesions or rashes     CT chest 09/19/13  very mild ground-glass attenuation in the  lower lobes of the lungs bilaterally, this is nonspecific. Given the  presence of mild bilateral lower lobe cylindrical bronchiectasis and  thickening of the peribronchovascular interstitium, this may simply  be related to prior episodes of infection and/or prior aspiration.  No other stigmata of interstitial lung disease is noted on today's  examination.        Assessment & Plan:

## 2013-10-15 NOTE — Patient Instructions (Signed)
Begin Oxygen 2l/m At bedtime   Trial Symbicort 80 2 puffs Twice daily  , rinse after use.  We are setting you up for a 2 D Echo .  Follow up Dr. Chase Caller in 4 weeks and As needed   Please contact office for sooner follow up if symptoms do not improve or worsen or seek emergency care

## 2013-10-15 NOTE — Assessment & Plan Note (Signed)
?   Contributing factor to Dypsnea  Check 2 D echo , discussed with pt.

## 2013-10-15 NOTE — Assessment & Plan Note (Signed)
DOE/dyspnea suspect is multifactoral with extensive workup  Previous smoker with combined dz/mild obstruction/restriction  CT chest w/ mild bronchiectasis , no ILD  Deconditioned w/ obesity  Suspect tendency toward fluid retention is contributing factor  Will check 2 d echo , add ICS/LABA to see if helps with dyspnea  Does drop sats while sleeping , ? OSA but denies daytime sleepiness or snoring    Plan  Begin Oxygen 2l/m At bedtime   Trial Symbicort 80 2 puffs Twice daily  , rinse after use.  We are setting you up for a 2 D Echo .  Follow up Dr. Chase Caller in 4 weeks and As needed   Please contact office for sooner follow up if symptoms do not improve or worsen or seek emergency care

## 2013-10-17 ENCOUNTER — Encounter: Payer: Self-pay | Admitting: Internal Medicine

## 2013-10-20 ENCOUNTER — Other Ambulatory Visit: Payer: Self-pay | Admitting: Internal Medicine

## 2013-10-25 ENCOUNTER — Other Ambulatory Visit (HOSPITAL_COMMUNITY): Payer: Medicare Other

## 2013-10-29 ENCOUNTER — Other Ambulatory Visit: Payer: Self-pay | Admitting: Internal Medicine

## 2013-10-31 ENCOUNTER — Ambulatory Visit (HOSPITAL_COMMUNITY): Payer: Medicare Other | Attending: Cardiology | Admitting: Radiology

## 2013-10-31 ENCOUNTER — Telehealth: Payer: Self-pay | Admitting: Internal Medicine

## 2013-10-31 DIAGNOSIS — R0609 Other forms of dyspnea: Secondary | ICD-10-CM | POA: Insufficient documentation

## 2013-10-31 DIAGNOSIS — R0989 Other specified symptoms and signs involving the circulatory and respiratory systems: Secondary | ICD-10-CM | POA: Diagnosis present

## 2013-10-31 DIAGNOSIS — I359 Nonrheumatic aortic valve disorder, unspecified: Secondary | ICD-10-CM

## 2013-10-31 DIAGNOSIS — R0600 Dyspnea, unspecified: Secondary | ICD-10-CM

## 2013-10-31 DIAGNOSIS — R0602 Shortness of breath: Secondary | ICD-10-CM

## 2013-10-31 DIAGNOSIS — I5033 Acute on chronic diastolic (congestive) heart failure: Secondary | ICD-10-CM

## 2013-10-31 DIAGNOSIS — J849 Interstitial pulmonary disease, unspecified: Secondary | ICD-10-CM

## 2013-10-31 DIAGNOSIS — I509 Heart failure, unspecified: Secondary | ICD-10-CM

## 2013-10-31 NOTE — Progress Notes (Signed)
Echocardiogram performed.  

## 2013-10-31 NOTE — Telephone Encounter (Signed)
ATC line busy x 5 wcb

## 2013-11-01 NOTE — Telephone Encounter (Signed)
lmtcb x1 w/ spouse 

## 2013-11-01 NOTE — Telephone Encounter (Signed)
lmtcb x1 

## 2013-11-01 NOTE — Telephone Encounter (Signed)
[id number] be gone for about 1hour half

## 2013-11-05 MED ORDER — PREDNISONE 10 MG PO TABS: ORAL_TABLET | ORAL | Status: DC

## 2013-11-05 NOTE — Telephone Encounter (Signed)
Pt aware of recs. RX called in. Nothing further needed 

## 2013-11-05 NOTE — Telephone Encounter (Signed)
Prednisone 10mg  Take 4 for three days 3 for three days 2 for three days 1 for three days and stop #30 trial Stop symbicort Keep appt with Dr Chase Caller

## 2013-11-05 NOTE — Telephone Encounter (Signed)
Spoke with pt--states that he was taking Symbicort 80 mcg- Rx'd by Rexene Edison 10/15/13 C/o chest tightness when taking inhaler.  Pt reports that this chest tightness did not happen any other time; only when using the inhaler. Denies cough, wheezing, SOB Pt stopped Symbicort around 10/30/13 Would like alternative to Symbicort or any other recommendations.  Pt scheduled to see Dr Chase Caller 11/26/13 Please advise Dr Joya Gaskins as Dr Chase Caller is not in office until 11/09/13

## 2013-11-06 ENCOUNTER — Encounter: Payer: Self-pay | Admitting: Adult Health

## 2013-11-07 ENCOUNTER — Other Ambulatory Visit: Payer: Self-pay | Admitting: Cardiovascular Disease

## 2013-11-07 ENCOUNTER — Telehealth: Payer: Self-pay | Admitting: Internal Medicine

## 2013-11-07 NOTE — Telephone Encounter (Signed)
Notes Recorded by Melvenia Needles, NP on 11/06/2013 at 10:39 PM 2 D echo shows good LV fxn w/ nnl EF 60%, Grade 1 DD  Mild Aortic stenosis  No sign change  Cont w/ ov recs . follow up with Dr. Chase Caller as planned 8/4 will discuss further on follow up  Please contact office for sooner follow up if symptoms do not improve or worsen or seek emergency care       ---- Pt has pending appt 11/26/13.

## 2013-11-07 NOTE — Telephone Encounter (Signed)
I spoke with patient about results and he verbalized understanding and had no questions 

## 2013-11-13 ENCOUNTER — Other Ambulatory Visit: Payer: Self-pay | Admitting: Cardiovascular Disease

## 2013-11-16 ENCOUNTER — Encounter: Payer: Self-pay | Admitting: Internal Medicine

## 2013-11-16 ENCOUNTER — Other Ambulatory Visit: Payer: Self-pay | Admitting: Internal Medicine

## 2013-11-23 ENCOUNTER — Other Ambulatory Visit: Payer: Self-pay | Admitting: Nephrology

## 2013-11-23 DIAGNOSIS — N183 Chronic kidney disease, stage 3 unspecified: Secondary | ICD-10-CM

## 2013-11-26 ENCOUNTER — Ambulatory Visit (INDEPENDENT_AMBULATORY_CARE_PROVIDER_SITE_OTHER): Payer: Medicare Other | Admitting: Internal Medicine

## 2013-11-26 ENCOUNTER — Encounter: Payer: Self-pay | Admitting: Internal Medicine

## 2013-11-26 VITALS — BP 134/70 | HR 67 | Ht 73.0 in | Wt 227.0 lb

## 2013-11-26 DIAGNOSIS — R0600 Dyspnea, unspecified: Secondary | ICD-10-CM

## 2013-11-26 DIAGNOSIS — R0609 Other forms of dyspnea: Secondary | ICD-10-CM

## 2013-11-26 DIAGNOSIS — R0989 Other specified symptoms and signs involving the circulatory and respiratory systems: Secondary | ICD-10-CM

## 2013-11-26 NOTE — Patient Instructions (Addendum)
Shortness of breath is multi-factorial I recommend repeat bike pulmonary stress testing to get more data but I doubt we will see something actionable Agree we will get Dr Johnsie Cancel input  Followup 3 months or sooner if needed

## 2013-11-26 NOTE — Progress Notes (Signed)
Subjective:    Patient ID: [name], male    DOB: 1932-02-03, 80 y.o.   MRN: [id number]  HPI  DYSPNEA - since July-Sept 2009  - Coronary artery disease. s/p CABG x 5 in 2002.  Dyspnea started following lopressor Jan-July 2009 and lisinopril increae July 2009. No relief despite stopping agents July 2010  -   Myoview on April 17, 2007 was  nonischemic with an EF of 51% and inferobasal wall scar.   - CAth Oct 2009:    - . Severe native three-vessel coronary artery disease. 2. Status post multivessel coronary bypass surgery with all grafts  patent. Normal LVEF and LVEDP - PFT - Mixed pattern on spiro. Mild restn on lung volumeswith near normal DLCO.   - Fev1 2.2L/73%, FVC 3.21L/70%, Ratio 68 (67),  TLC 4.7L/68%, RV 1.5L/55%, DLCO 79%.  -CPST 05/06/2008: Normal effot.    - Reduced VO2 max 20.5/65%, REduced AT 8.2/40%, Normal breathing reservce of 55%, submaximal heart rate response 112/77%,  flattened O2 pulse response at peak exercise - 39ml/beat at 85%. No VQ mismatch abnormalities. - ECHO 09/04/2008: LVH, ef 60%, mild AS and unchanged from prior   - CT chest   - Non specific Interstitial Lung disease NOS. Stable pulm infilratates RLL ggo > LLL ggo. 03/2008 -> 10/2008 -> June 2011: stable - Rehab: never attended 2009-2013 due to cost   -  reports that he quit smoking about 50 years ago. His smoking use included Cigarettes. He has a 20 pack-year smoking history. He has never used smokeless tobacco.  OV 10/05/2011 Last seen September 22, 2009 and discharged from followup. He never attended rehab due to cost issues. Now reports that on 09/05/11 noticed abrupt dyspnea worsened from class 1 to 3. Also, edema. No chest pain, hemoptysis. So, 09/09/11 went to ER. Trop, BNP normal. No d-didimer. CXR reported as stable but am wondering if there is increased ILD. Given lasix and now edema better and dyspnea better and > 75% towads baseline  Walking desat test : resting 98/94% -> 3 laps x 185 feet; HR 113/pulse  ox 96%   Past, Family, Social reviewed: no change since last visit in 2 year. No major changes to health in 2 years  REC Do not know why exactly you are more short of breath The main difference I think we notice compared to 2 years ago is the slightly more crackling noise in lung but this is not showing up on walking test or xray Best you have breathing test called PFT Return to see me or NP or DR Halford Chessman  for results of PFt in next 4 weeks   OV 11/22/2011 Followup test results: Dyspnea is unchanged since last. IN review of tests: PFTs July 2013 show worsening restriction since 2010 along with reduced diffusion. The RLL baseline GGO might be worse in July 2013 compared to 2011. In addition, autommune profile shows VERY HIGH TITERS of CCP antibody > 300 which is pathognomonic of rheumtoid arthritis lung involvement. RF is only borderline elevated a 23   PFT 10/12/11>>FEV1 2.02 (70%), Fvc 2.8/72%, TLC 4.07 (59%), DLCO 62%, no BD: RESTRICTION - WORSE since 2010 -(2010:  Fev1 2.2L/73%, FVC 3.21L/70%, Ratio 68 (67),  TLC 4.7L/68%, RV 1.5L/55%, DLCO 79%)   Echo 05/04/11>>mild LVH, EF 55 to 123456, grade 1 diastolic dysfx, mild AS, mild MR, PAS 34 mmHg  CT 11/02/11 - calcification suspicious for aortic valve and some GGO esp RLL > LLL (? RLL worse compared to 2011)  Past, Family, Social reviewed: new problem is that  rT flank and RUQ pain. No associated weight loss but has gained. Come and goes. Mild pain. Unclear what aggravating factors and relieving factors. Annoying quality.    Have blood work today related to lung disease  I will talk to Dr Johnsie Cancel  I will call you back after results are in and after talking to Dr Johnsie Cancel  ...  Update - see phone note 11/26/11. I will send it to Dr Johnsie Cancel   Telephone call 8/22/013 hutoimmune test results: RF and CCP antibodies positive. Esp CCP strongly positive. 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 Dyspnea is from this +/- joint pain that he  might be having. He needs immune     Cardiology office note November 2013  He was admitted 9/27-10/1 with chest pain. Myocardial infarction was ruled out. Cardiac catheterization demonstrated high-grade disease in the SVG-RCA as well as ostial PDA distal to the graft. He had some complications from his diagnostic cath with groin hematoma. He was brought back to the Cath Lab several days later for PCI with DES to the [facility] and cutting PTCA to the [place]. Dual antiplatelet therapy recommended for one year.  LHC 12/31/11: dLM 80%, LAD 100%, circumflex 100%, RCA 100%, proximal SVG-OM 30%, SVG-D1 ok, SVG-RCA 99% distal and 80% ostial PDA distal to graft, LIMA-LAD ok with some left to right collaterals.  PCI 01/03/12: Promus DES to the SVG-RCA and Cutting Balloon angioplasty to the ostial PDA.  Echocardiogram 12/31/11: EF 99991111, grade 1 diastolic dysfunction, mild aortic stenosis, mean gradient 9, mild LAE.  Labs (10/13): K 4.5, creatinine 1.29, ALT 14, LDL 69, Hgb 12.5  Since d/c, he is doing well. No chest pain. Dyspnea improved. Probably Class IIb. No syncope. No orthopnea, PND, edema   OV 04/27/2012  Mr. Kravets returns for followup after 6 months. This is for dyspnea out of proportion to his interstitial lung disease. His interstitial lung disease is on the basis of rheumatoid arthritis. Since I last saw him he has seen rheumatologist  Dr [name], who has started him on Imuran and prednisone. He does be that he took the prednisone only for a short time but he continues on Imuran right now. He feels that this intervention only helped his arthritic symptoms which came to light after the diagnosis of serum antibody positivity rheumatoid arthritis. However it does not help his dyspnea. Then in September 2013 J. chest pain and had cardiac catheterization and status post PCI. He followed up with cardiology in November 2013 and was noted to have improved dyspnea but he tells me now that his dyspnea never  really improved. Currently he is dyspneic for simple things like tying his shoelaces, bending over. It is also dyspneic when he is sitting and doing nothing and he feels like he is to take a big deep breath. Is also dyspneic with exertion and relieved with rest. Severity is moderate. Symptoms are unimproved but stable. There is no associated cough. Of note he went to the emergency room early in January 2014 and was given albuterol as needed which he says helps his symptoms. Labs on 04/07/2047 normal. He is really frustrated by his dyspnea.  So far we have never been able to improve his dyspnea. His rheumatologist is concerned about worsening interstitial lung disease  Of note in the past we've had several discussions about attending pulmonary rehabilitation but due to cost factors he is refused  I screened him about depression  and anxiety and he denies this.  Past, Family, Social reviewed: no change since last visit other than noted as above   rec I understand it is frustrating that they've not been able to improve your shortness of breath despite several interventions and new diagnoses  Please repeat pulmonary function testing and if it shows the pulmonary fibrosis is getting worse I will talk to her rheumatologist about changing some medications  - However this is only meant to prevent further worsening and will not improve things from where they are  In order to improve things from where they are you absolutely need pulmonary rehabilitation and I made a referral to them. We will work with them about reducing cost  also start empiric dulera samples; he will opick this up when he comes for PFT  Followup based on our phone conversation of pulmonary function test results   Ov 05/05/2012  Mr. Memon returns acute followup.. After his visit last week he did have pulmonary function test on 04/28/2012 when compared to his pre-Imuran pulmonary function test in July 2013 current pulmonary function test  shows worsening in terms of spirometry with improvement in terms of diffusion capacity. Specifically his FEV1 is now 1.77 L/62% and this is down by 12%. His FVC is 2.62 L/59% and this is decreased by 7%. His total lung capacity is 4 L/59% and this is unchanged. His diffusion capacity is 15.7/71% and this is actually improved by 11%. He is started later a few days ago but this has not made any impact in his symptoms.  However yesterday he developed an acute symptomatology of shortness of breath and some nausea and up in the emergency room. Labs are listed below which have reviewed in the look okay. Emergency room services discharge him on Tamiflu and antibiotics. He is currently feeling better. There no new issues other than chronic worsening of dyspnea  Walking desaturation test on 05/05/2012 185 feet x 3 laps:  did NOT desaturate. Rest pulse ox was 98%, final pulse ox was 96%. HR response 69/min at rest to 82/min at peak exertion.    OV 09/17/2013  Chief Complaint  Patient presents with  . Acute Visit    Pt last seen by MR on 04/2012. Pt c/o dyspnea with any activity. Pt states he has very little cough with grey mucous and CP with deep breaths.    Not seen in the past one year. Continues to have dyspnea on exertion. He is on Imuran although medication list does not have on prednisone anymore. He states dyspnea stable. His rheumatologist sent him back here because of persistent dyspnea and if it is class II to class III on exertion. Last pulmonary function tests, CT scan of the chest and oxygen evaluation was over a year ago. There no new problems. His weight has gained back a few pounds according to his history. This was associated with cough. It is exertional and relieved by rest.   PFT FVC fev1 ratio BD fev1 TLC DLCO comment intervention  2010 3.2L/70% 2.2L/73%   4.7L/68% 79%    10/12/11 2.8L/72% 2.02L/70% 72  4.07L/59% 62% Worsening restriction since 2010 Rheum will start immuran/pred. Also had  PCI in nov 2013  04/28/12 2.6L/59% 1.77L/62% 67  4.0L/59% 15.7/71%     #Shortness of breath and rheumatoid arthritis  - unclear why possibly worse  - High Resolution CT chest without contrast on ILD protocol. Only  Dr Lorin Picket or Dr. Vinnie Langton to read - do full PFT -  do ONO test on room air  - do walk test on room air in office (not 43mwd) now or at followup   #FOllowup  - after above in 4 weeks with me or my NP to regroup   10/15/2013 Follow up  MR pt here for 4 week follow up .   Reports breathing has been doing well, no new complaints.  Will finish Doxycycline 7/14 for [facility] Spotted Fever (was exposed to tick bite)  He has had several tests since last ov , we discussed and reviewed the following.  CT chest showed bronchiectasis w/ no ILD  ONO showed some desats .  Denies daytimes sleepiness or snoring. Previous abg w/ no hypercarbia noted 2013 .  No desats w/ ambulation  PFT showed no significant change with FEV1 56% , ratio 66,  FVC 61%.  Denies hemoptysis , chest pain, orthopnea, edema or fever.   Begin Oxygen 2l/m At bedtime   Trial Symbicort 80 2 puffs Twice daily  , rinse after use.  We are setting you up for a 2 D Echo .  Follow up Dr. Chase Caller in 4 weeks and As needed   Please contact office for sooner follow up if symptoms do not improve or worsen or seek emergency care    OV 11/26/2013  Chief Complaint  Patient presents with  . Follow-up    Pt wearing 2lpm at bedtime. Pt c/o DOE, mild dry cough and chest tightness when SOB. Pt had CT in 09/2013 and echo in 10/2013.     Followup dyspnea multifactorial  - Overnight oxygen test showed desaturations at night. He is using 2 L nasal cannula at night. Despite oxygen use he is not feeling any better. His main complaint is that the slips off . He also tried Symbicort but this did not help and in fact it paradoxically increased bronchoconstriction subjectively and therefore he has stopped this. Pulmonary  function tests does not show any decline  CT scan of the chest does not show any clear cut evidence of residual lung disease other than old bronchiectasis is mild. Echo 10/31/2013: Shows grade 1 diastolic dysfunction and mild aortic stenosis but otherwise unremarkable with a left ventricular ejection fraction 55%. I offered a cardiopulmonary stress test repeat but he wants to talk to his cardiologist before deciding on this.  -  no new issues   Review of Systems  Constitutional: Negative for fever and unexpected weight change.  HENT: Negative for congestion, dental problem, ear pain, nosebleeds, postnasal drip, rhinorrhea, sinus pressure, sneezing, sore throat and trouble swallowing.   Eyes: Negative for redness and itching.  Respiratory: Positive for cough, chest tightness and shortness of breath. Negative for wheezing.   Cardiovascular: Negative for palpitations and leg swelling.  Gastrointestinal: Negative for nausea and vomiting.  Genitourinary: Negative for dysuria.  Musculoskeletal: Negative for joint swelling.  Skin: Negative for rash.  Neurological: Negative for headaches.  Hematological: Does not bruise/bleed easily.  Psychiatric/Behavioral: Negative for dysphoric mood. The patient is not nervous/anxious.        Objective:   Physical Exam    Filed Vitals:   11/26/13 1142  BP: 134/70  Pulse: 67  Height: 6\' 1"  (1.854 m)  Weight: 227 lb (102.967 kg)  SpO2: 96%    GEN: A/Ox3; pleasant , NAD, elderly obese   HEENT:  [place]/AT,  EACs-clear, TMs-wnl, NOSE-clear, THROAT-clear, no lesions, no postnasal drip or exudate noted.   NECK:  Supple w/ fair ROM; no JVD; normal carotid impulses w/o  bruits; no thyromegaly or nodules palpated; no lymphadenopathy.  RESP  Decreased BS in bases , w/o, wheezes/ rales/ or rhonchi.no accessory muscle use, no dullness to percussion  CARD:  RRR, no m/r/g  ,tr  peripheral edema, pulses intact, no cyanosis or clubbing.  GI:   Soft & nt; nml bowel  sounds; no organomegaly or masses detected.  Musco: Warm bil, no deformities or joint swelling noted.   Neuro: alert, no focal deficits noted.    Skin: Warm, no lesions or rashes     Assessment & Plan:  Shortness of breath is multi-factorial I recommend repeat bike pulmonary stress testing to get more data but I doubt we will see something actionable Agree we will get Dr Johnsie Cancel input before ordering CPST bike test  Followup 3 months or sooner if needed

## 2013-11-27 ENCOUNTER — Ambulatory Visit
Admission: RE | Admit: 2013-11-27 | Discharge: 2013-11-27 | Disposition: A | Discharge status: Home / Self Care | Payer: Medicare Other | Source: Ambulatory Visit | Attending: Nephrology | Admitting: Nephrology

## 2013-11-27 ENCOUNTER — Other Ambulatory Visit: Payer: Self-pay | Admitting: Internal Medicine

## 2013-11-27 DIAGNOSIS — N183 Chronic kidney disease, stage 3 unspecified: Secondary | ICD-10-CM

## 2013-11-28 ENCOUNTER — Other Ambulatory Visit: Payer: Medicare Other

## 2013-12-03 NOTE — Assessment & Plan Note (Signed)
Shortness of breath is multi-factorial I recommend repeat bike pulmonary stress testing to get more data but I doubt we will see something actionable Agree we will get Dr Johnsie Cancel input before ordering CPST bike test  Followup 3 months or sooner if needed  (> 50% of this 15 min visit spent in face to face counseling)

## 2013-12-17 ENCOUNTER — Encounter: Payer: Self-pay | Admitting: Internal Medicine

## 2013-12-17 ENCOUNTER — Ambulatory Visit (INDEPENDENT_AMBULATORY_CARE_PROVIDER_SITE_OTHER): Payer: Medicare Other | Admitting: Internal Medicine

## 2013-12-17 VITALS — BP 140/80 | HR 64 | Temp 98.0°F | Wt 219.0 lb

## 2013-12-17 DIAGNOSIS — N400 Enlarged prostate without lower urinary tract symptoms: Secondary | ICD-10-CM

## 2013-12-17 DIAGNOSIS — K439 Ventral hernia without obstruction or gangrene: Secondary | ICD-10-CM | POA: Insufficient documentation

## 2013-12-17 MED ORDER — GLUCOSE BLOOD VI STRP: ORAL_STRIP | Status: DC

## 2013-12-17 MED ORDER — ONETOUCH BASIC SYSTEM W/DEVICE KIT: PACK | Status: DC

## 2013-12-17 MED ORDER — ONETOUCH LANCETS MISC: Status: DC

## 2013-12-17 NOTE — Addendum Note (Signed)
Addended by: Despina Hidden on: 12/17/2013 04:46 PM   Modules accepted: Orders

## 2013-12-17 NOTE — Assessment & Plan Note (Signed)
At lower edge of gallbladder scar No obstruction Discussed using velcro binder for support

## 2013-12-17 NOTE — Progress Notes (Signed)
Pre visit review using our clinic review tool, if applicable. No additional management support is needed unless otherwise documented below in the visit note. 

## 2013-12-17 NOTE — Assessment & Plan Note (Signed)
Mild symptoms Mild enlargement via ultrasound Discussed that we don't want to evaluate this any further (was apparently homogeneous but I don't want to know if he has cancer--Rx more dangerous than the disease)

## 2013-12-17 NOTE — Progress Notes (Signed)
Subjective:    Patient ID: [name], male    DOB: 12-08-1931, 80 y.o.   MRN: [id number]  HPI Saw nephrologist per [name] Got renal ultrasound Note of prostate enlargement--mild  Does have some hesitancy with urine stream Stable 1x nocturia Some daytime frequency but no urgency or incontinence  Also with some pain from hernia along right side Bowels okay May just hurt when sitting around  Current Outpatient Prescriptions on File Prior to Visit  Medication Sig Dispense Refill  . amLODipine (NORVASC) 10 MG tablet Take 10 mg by mouth daily.      [name] aspirin 81 MG tablet Take 81 mg by mouth daily.        [name] azaTHIOprine (IMURAN) 50 MG tablet Take 50 mg by mouth daily.       . carvedilol (COREG) 12.5 MG tablet TAKE 1 TABLET BY MOUTH TWICE A DAY WITH A MEAL  60 tablet  1  . DHA-Vitamin C-Lutein (EYE HEALTH FORMULA PO) Take by mouth.      . esomeprazole (NEXIUM) 20 MG capsule Take 20 mg by mouth daily.       . furosemide (LASIX) 40 MG tablet Take 1-2 tablets (40-80 mg total) by mouth daily.  60 tablet  11  . gabapentin (NEURONTIN) 300 MG capsule TAKE 3 CAPSULES BY MOUTH IN THE MORNING AND 4-5 CAPSULES AT BEDTIME  240 capsule  3  . isosorbide mononitrate (IMDUR) 30 MG 24 hr tablet TAKE 1 TABLET BY MOUTH DAILY  30 tablet  3  . losartan (COZAAR) 100 MG tablet TAKE 1 TABLET (100 MG TOTAL) BY MOUTH DAILY.  90 tablet  3  . metFORMIN (GLUCOPHAGE) 1000 MG tablet TAKE 1 TABLET BY MOUTH TWICE A DAY WITH A MEAL  60 tablet  11  . Multiple Vitamins-Minerals (ICAPS) CAPS Take 1 capsule by mouth daily.       [name] NITROSTAT 0.4 MG SL tablet DISSOLVE 1 TABLET UNDER THE TONGUE FOR CHEST PAIN. MAY REPEAT EVERY 5MINUTES UP TO 3 DOSES. CALL 911 IF NOT RESOLVED.  25 tablet  4  . nortriptyline (PAMELOR) 25 MG capsule Take 1-2 capsules (25-50 mg total) by mouth at bedtime.  60 capsule  11  . pravastatin (PRAVACHOL) 40 MG tablet TAKE 1 TABLET BY MOUTH DAILY  30 tablet  11  . vitamin B-12 (CYANOCOBALAMIN)  1000 MCG tablet Take 1,000 mcg by mouth daily.       No current facility-administered medications on file prior to visit.    Allergies  Allergen Reactions  . Doxazosin Mesylate Other (See Comments)    REACTION: dizzy  . Methocarbamol Rash    "don't remember how bad"    Past Medical History  Diagnosis Date  . Hyperlipidemia   . Hypertension   . GERD (gastroesophageal reflux disease)   . Arthritis     osteoarthritis, s/p R TKR, and digits  . Overweight (BMI 25.0-29.9)     BMI 29  . Interstitial lung disease     NOS  . Pulmonary infiltrates 12-09    stable, 7/10: ? due to GERD  . Diverticulosis   . Esophageal stricture     s/p dilation spring 2010  . CAD (coronary artery disease)     symptoms followed lopressor Jan-Jul 09 and lisinopril increase July 09, 1/09 Myoview- non ischemic with an EF of 51% and inferobasal wall scar, 10/09 Cath- severe native three-vessel coronary artery disease, s/p multivessel coronary bypass graft with all grafts, Normal LV sys fn with  normal LV filling pressures; 10/04-Adenosine cardiolite- thinning wo ischemia, EF 55%; 1/08 Cardiolite neg  . History of PFTs     mixed pattern on spiro. mild restn on lung volumes with near normal DLCO. Pattern can be explained by CABG scar. Fev1 2.2L/73%, ratio 68 (67), TLC 4.7/68%,RV 1.5L/55%,DLCO 79%  . Colon polyps   . Chronic kidney disease, stage III (moderate)   . Chronic diastolic CHF (congestive heart failure)     a) 09/13 ECHO- LVEF 99991111, grade 1 diastolic dysfunction, mild LA dilatation, atrial septal aneurysm, AV mobility restricted, but no sig AS by doppler; b) 09/04/08 ECHO- LVH, ef 60%, mild AS,  . Heart murmur   . Type II diabetes mellitus   . Chronic lower back pain   . RA (rheumatoid arthritis)     [name]  . Myocardial infarction     pci  . Dyspnea 2009 since July -Sept    05/06/08-CPST-  normal effort, reduced VO2 max 20.5 /65%, reduced at 8.2/ 40%, normal breathing resetvca of 55%,  submaximal heart rate response 112/77%, flattened o2 pluse response at peak exercise-12 ml/beat @ 85%, No VQ mismatch abnormalities, All c/w CIRC Limitation  . Hiatal hernia   . Anginal pain 02/24/13  . Seropositive rheumatoid arthritis     Past Surgical History  Procedure Laterality Date  . Total knee arthroplasty  03/2010    Right- [name]  . Cholecystectomy  11/2003    [name]- open  . Knee arthroscopy  2008    right  . Cataract extraction w/ intraocular lens  implant, bilateral    . Coronary artery bypass graft  11/1999    CABG X5  . Cardiac catheterization  5/06    CP- no MI, Cath- small vessell disease  . Cardiac catheterization  12/31/2011    80% distal LM, 100% native LAD, LCx and RCA, 30% prox SVG-OM, SVG-D1 normal, 99% distal, 80% ostial SVG-RCA distal to graft, LIMA-LAD normal; LVEF mildly decreased with posterior basal AK  . Ptca  01/03/2012    Successful DES to SVG-RCA and cutting balloon angioplasty ostial  PDA  . Eye surgery    . Coronary stent placement  02/2012    1 stent and balloon  . Shoulder arthroscopy with open rotator cuff repair and distal clavicle acrominectomy Left 02/27/2013    Procedure: LEFT SHOULDER ARTHROSCOPY WITH MINI OPEN ROTATOR CUFF REPAIR AND SUBACROMIAL DECOMPRESSION AND DISTAL CLAVICLE RESECTION;  Surgeon: [name], MD;  Location: [place];  Service: Orthopedics;  Laterality: Left;  . Trigger finger release Left 02/27/2013    Procedure: RELEASE TRIGGER FINGER/A-1 PULLEY;  Surgeon: [name], MD;  Location: [place];  Service: Orthopedics;  Laterality: Left;    Family History  Problem Relation Age of Onset  . COPD Mother   . Heart disease Father   . Diabetes Brother   . Colon cancer Brother   . Alcohol abuse Sister     History   Social History  . Marital Status: Married    Spouse Name: N/A    Number of Children: N/A  . Years of Education: N/A   Occupational History  . lawn mower     retired   Social History Main  Topics  . Smoking status: Former Smoker -- 1.00 packs/day for 20 years    Types: Cigarettes    Quit date: 04/06/1963  . Smokeless tobacco: Never Used  . Alcohol Use: No     Comment: 01/01/2012 "last alcohol ~ 50 yr ago"  .  Drug Use: No  . Sexual Activity: Not Currently   Other Topics Concern  . Not on file   Social History Narrative   No living will   Requests wife as health care POA   Discussed DNR --he requests this (done 08/29/12)   Not sure about feeding tube---but might accept for some time   Review of Systems Has lost 8# since my last visit--working on eating right Went on diet his daughter got at [facility] to avoid heartburn--no gassy or spicy foods, etc    Objective:   Physical Exam  Constitutional: He appears well-developed and well-nourished. No distress.  Abdominal:  Slight hernia superior to lower end of gallbladder scar No sig tenderness          Assessment & Plan:

## 2013-12-25 ENCOUNTER — Ambulatory Visit: Payer: Medicare Other | Admitting: Cardiovascular Disease

## 2013-12-28 ENCOUNTER — Ambulatory Visit: Payer: Medicare Other | Admitting: Cardiology

## 2014-01-01 ENCOUNTER — Ambulatory Visit: Payer: Medicare Other | Admitting: Cardiology

## 2014-01-02 ENCOUNTER — Encounter: Payer: Self-pay | Admitting: Physician Assistant

## 2014-01-02 ENCOUNTER — Ambulatory Visit (INDEPENDENT_AMBULATORY_CARE_PROVIDER_SITE_OTHER): Payer: Medicare Other | Admitting: Physician Assistant

## 2014-01-02 ENCOUNTER — Telehealth: Payer: Self-pay | Admitting: *Deleted

## 2014-01-02 VITALS — BP 140/70 | HR 65 | Ht 73.0 in | Wt 224.0 lb

## 2014-01-02 DIAGNOSIS — R0602 Shortness of breath: Secondary | ICD-10-CM

## 2014-01-02 DIAGNOSIS — N189 Chronic kidney disease, unspecified: Secondary | ICD-10-CM

## 2014-01-02 DIAGNOSIS — I5032 Chronic diastolic (congestive) heart failure: Secondary | ICD-10-CM

## 2014-01-02 DIAGNOSIS — I10 Essential (primary) hypertension: Secondary | ICD-10-CM

## 2014-01-02 DIAGNOSIS — R079 Chest pain, unspecified: Secondary | ICD-10-CM

## 2014-01-02 DIAGNOSIS — J8410 Pulmonary fibrosis, unspecified: Secondary | ICD-10-CM

## 2014-01-02 DIAGNOSIS — J849 Interstitial pulmonary disease, unspecified: Secondary | ICD-10-CM

## 2014-01-02 DIAGNOSIS — E785 Hyperlipidemia, unspecified: Secondary | ICD-10-CM

## 2014-01-02 DIAGNOSIS — I2510 Atherosclerotic heart disease of native coronary artery without angina pectoris: Secondary | ICD-10-CM

## 2014-01-02 DIAGNOSIS — I359 Nonrheumatic aortic valve disorder, unspecified: Secondary | ICD-10-CM

## 2014-01-02 LAB — BRAIN NATRIURETIC PEPTIDE: PRO B NATRI PEPTIDE: 43 pg/mL (ref 0.0–100.0)

## 2014-01-02 NOTE — Patient Instructions (Signed)
LAB WORK TODAY; BNP   Your physician has requested that you have a lexiscan myoview. For further information please visit [email or web address]. Please follow instruction sheet, as given.  Your physician recommends that you schedule a follow-up appointment in: [place], [place] IS IN THE OFFICE

## 2014-01-02 NOTE — Progress Notes (Signed)
Cardiology Office Note    Date:  01/02/2014   ID:  [name], [name] 09-13-1931, MRN [id number]  PCP:  Viviana Simpler, MD  Cardiologist:  Dr. [name]     History of Present Illness: [name] is a 80 y.o. male with a hx of CAD, s/p CABG, interstitial lung disease, mild aortic stenosis, HTN, HL, DM2, CKD, rheumatoid arthritis. He has chronic dyspnea. He had unstable angina in 9/13 and underwent DES to the [facility] and cutting POBA to ostial PDA.    Followed by Dr. Chase Caller for ILD.  Recent PFTs without significant change (FEV1 56% , ratio 66, FVC 61%).  Recent echo showed normal BiV function and just mild AS.  He has desats at night and is now on O2.  Plan is to proceed with CPX (bike) to further assess his dyspnea.  But, patient asked to FU here first.    Patient notes chronic exertional chest discomfort. He has had this for years without significant change. He also notes chronic dyspnea. He is NYHA class III. He denies orthopnea, PND or edema. He denies any radiating symptoms with his chest pain. He denies syncope.   Studies:  - Nuclear (2/13):  Inferior scar, no ischemia, EF 45%, low-risk  - LHC 12/31/11: dLM 80%, LAD 100%, circumflex 100%, RCA 100%, proximal SVG-OM 30%, SVG-D1 ok, SVG-RCA 99% distal and 80% ostial PDA distal to graft, LIMA-LAD ok with some left to right collaterals. >> PCI 01/03/12: Promus DES to the SVG-RCA and Cutting Balloon angioplasty to the ostial PDA.  - Echo (7/15): Mild LVH, EF 60-65%, normal wall motion, grade 1 diastolic dysfunction, mild aortic stenosis (mean 14 mm Hg), normal RV function   Recent Labs/Images:  02/19/2013: ALT 12; AST 19  02/24/2013: Hemoglobin 12.9*; Pro B Natriuretic peptide (BNP) 130.5; WBC 7.0  02/25/2013: HDL Cholesterol by NMR 30*; LDL (calc) 76  06/21/2013: BUN 25*; Creatinine 1.8*; Potassium 3.7; Sodium 138   09/10/2013: Hemoglobin-A1c 7.6*   Wt Readings from Last 3 Encounters:  01/02/14 224 lb (101.606 kg)    12/17/13 219 lb (99.338 kg)  11/26/13 227 lb (102.967 kg)     Past Medical History  Diagnosis Date  . Hyperlipidemia   . Hypertension   . GERD (gastroesophageal reflux disease)   . Arthritis     osteoarthritis, s/p R TKR, and digits  . Overweight (BMI 25.0-29.9)     BMI 29  . Interstitial lung disease     NOS  . Pulmonary infiltrates 12-09    stable, 7/10: ? due to GERD  . Diverticulosis   . Esophageal stricture     s/p dilation spring 2010  . CAD (coronary artery disease)     symptoms followed lopressor Jan-Jul 09 and lisinopril increase July 09, 1/09 Myoview- non ischemic with an EF of 51% and inferobasal wall scar, 10/09 Cath- severe native three-vessel coronary artery disease, s/p multivessel coronary bypass graft with all grafts, Normal LV sys fn with normal LV filling pressures; 10/04-Adenosine cardiolite- thinning wo ischemia, EF 55%; 1/08 Cardiolite neg  . History of PFTs     mixed pattern on spiro. mild restn on lung volumes with near normal DLCO. Pattern can be explained by CABG scar. Fev1 2.2L/73%, ratio 68 (67), TLC 4.7/68%,RV 1.5L/55%,DLCO 79%  . Colon polyps   . Chronic kidney disease, stage III (moderate)   . Chronic diastolic CHF (congestive heart failure)     a) 09/13 ECHO- LVEF 54-65%, grade 1 diastolic dysfunction, mild LA dilatation,  atrial septal aneurysm, AV mobility restricted, but no sig AS by doppler; b) 09/04/08 ECHO- LVH, ef 60%, mild AS,  . Heart murmur   . Type II diabetes mellitus   . Chronic lower back pain   . RA (rheumatoid arthritis)     [name]  . Myocardial infarction     pci  . Dyspnea 2009 since July -Sept    05/06/08-CPST-  normal effort, reduced VO2 max 20.5 /65%, reduced at 8.2/ 40%, normal breathing resetvca of 55%, submaximal heart rate response 112/77%, flattened o2 pluse response at peak exercise-12 ml/beat @ 85%, No VQ mismatch abnormalities, All c/w CIRC Limitation  . Hiatal hernia   . Anginal pain 02/24/13  . Seropositive  rheumatoid arthritis     Current Outpatient Prescriptions  Medication Sig Dispense Refill  . amLODipine (NORVASC) 10 MG tablet Take 10 mg by mouth daily.      [name] aspirin 81 MG tablet Take 81 mg by mouth daily.        [name] azaTHIOprine (IMURAN) 50 MG tablet Take 50 mg by mouth daily.       . Blood Glucose Monitoring Suppl ([place]) W/DEVICE KIT Used to test blood sugar once daily dx: 250.60  1 each  0  . carvedilol (COREG) 12.5 MG tablet TAKE 1 TABLET BY MOUTH TWICE A DAY WITH A MEAL  60 tablet  1  . DHA-Vitamin C-Lutein (EYE HEALTH FORMULA PO) Take by mouth.      . esomeprazole (NEXIUM) 20 MG capsule Take 20 mg by mouth daily.       . furosemide (LASIX) 40 MG tablet Take 1-2 tablets (40-80 mg total) by mouth daily.  60 tablet  11  . gabapentin (NEURONTIN) 300 MG capsule TAKE 3 CAPSULES BY MOUTH IN THE MORNING AND 4-5 CAPSULES AT BEDTIME  240 capsule  3  . glucose blood (ONE TOUCH TEST STRIPS) test strip Used to test blood sugar once daily dx: 250.60  100 each  1  . isosorbide mononitrate (IMDUR) 30 MG 24 hr tablet TAKE 1 TABLET BY MOUTH DAILY  30 tablet  3  . losartan (COZAAR) 100 MG tablet TAKE 1 TABLET (100 MG TOTAL) BY MOUTH DAILY.  90 tablet  3  . metFORMIN (GLUCOPHAGE) 1000 MG tablet TAKE 1 TABLET BY MOUTH TWICE A DAY WITH A MEAL  60 tablet  11  . Multiple Vitamins-Minerals (ICAPS) CAPS Take 1 capsule by mouth daily.       [name] NITROSTAT 0.4 MG SL tablet DISSOLVE 1 TABLET UNDER THE TONGUE FOR CHEST PAIN. MAY REPEAT EVERY 5MINUTES UP TO 3 DOSES. CALL 911 IF NOT RESOLVED.  25 tablet  4  . nortriptyline (PAMELOR) 25 MG capsule Take 1-2 capsules (25-50 mg total) by mouth at bedtime.  60 capsule  11  . ONE TOUCH LANCETS MISC Used to test blood sugar once daily dx: 250.60  100 each  1  . pravastatin (PRAVACHOL) 40 MG tablet TAKE 1 TABLET BY MOUTH DAILY  30 tablet  11  . vitamin B-12 (CYANOCOBALAMIN) 1000 MCG tablet Take 1,000 mcg by mouth daily.       No current facility-administered  medications for this visit.     Allergies:   Doxazosin mesylate and Methocarbamol   Social History:  The patient  reports that he quit smoking about 50 years ago. His smoking use included Cigarettes. He has a 20 pack-year smoking history. He has never used smokeless tobacco. He reports that he does not drink  alcohol or use illicit drugs.   Family History:  The patient's family history includes Alcohol abuse in his sister; COPD in his mother; Colon cancer in his brother; Diabetes in his brother; Heart attack in his father; Heart disease in his father; Stroke in his sister.   ROS:  Please see the history of present illness.       All other systems reviewed and negative.    PHYSICAL EXAM: VS:  BP 140/70  Pulse 65  Ht _0  (1.854 m)  Wt 224 lb (101.606 kg)  BMI 29.56 kg/m2 Well nourished, well developed, in no acute distress HEENT: normal Neck:  no JVD Cardiac:  normal S1, S2;  RRR; 9-0/2 systolic murmur RUSB Lungs:  Bibasilar crackles (chronic), no wheezes Abd: soft, nontender, no hepatomegaly Ext: trace bilateral ankle edema Skin: warm and dry Neuro:  CNs 2-12 intact, no focal abnormalities noted  EKG:  NSR, HR 65, inferior Q waves, no change from prior tracing      ASSESSMENT AND PLAN:  1. CAD:  Patient has had chronic chest pain. He has been recently seen by pulmonology for dyspnea. Plan is to proceed with cardiopulmonary stress test after evaluation by cardiology. Patient also has chronic dyspnea. It has been 2 years since his last assessment for ischemia. Arrange Lexiscan Myoview to rule out large areas of ischemia.  Continue aspirin, beta blocker, statin. 2. Diastolic CHF:  Volume appears stable. Check BNP today. Recent echocardiogram with normal LV function 3. Aortic stenosis:  Mild by recent echocardiogram. 4. HTN:  Controlled. 5. Hyperlipidemia:  Continue statin. 6. Interstitial lung disease:  Follow up with pulmonology. 7. Chronic kidney disease:  Creatinine stable by  most recent measurement. 8. Diabetes mellitus:   Fair control.  Last A1c 7.6.    Disposition:    FU with Dr. Johnsie Cancel or me in one month.   Signed, Versie Starks, MHS 01/02/2014 10:06 AM    [facility] [address], [place], [place]  [address] Phone: [phone]; Fax: [phone]

## 2014-01-02 NOTE — Telephone Encounter (Signed)
pt notified about lab results with verbal understanding  

## 2014-01-04 ENCOUNTER — Other Ambulatory Visit: Payer: Self-pay | Admitting: Cardiovascular Disease

## 2014-01-04 ENCOUNTER — Other Ambulatory Visit: Payer: Self-pay | Admitting: Internal Medicine

## 2014-01-07 ENCOUNTER — Ambulatory Visit (HOSPITAL_COMMUNITY): Payer: Medicare Other | Attending: Internal Medicine | Admitting: Radiology

## 2014-01-07 VITALS — BP 127/66 | HR 49 | Ht 73.0 in | Wt 220.0 lb

## 2014-01-07 DIAGNOSIS — E119 Type 2 diabetes mellitus without complications: Secondary | ICD-10-CM | POA: Diagnosis not present

## 2014-01-07 DIAGNOSIS — R0602 Shortness of breath: Secondary | ICD-10-CM

## 2014-01-07 DIAGNOSIS — R079 Chest pain, unspecified: Secondary | ICD-10-CM | POA: Diagnosis not present

## 2014-01-07 DIAGNOSIS — I2510 Atherosclerotic heart disease of native coronary artery without angina pectoris: Secondary | ICD-10-CM

## 2014-01-07 DIAGNOSIS — I10 Essential (primary) hypertension: Secondary | ICD-10-CM | POA: Insufficient documentation

## 2014-01-07 MED ORDER — TECHNETIUM TC 99M SESTAMIBI GENERIC - CARDIOLITE
11.0000 | Freq: Once | INTRAVENOUS | Status: AC | PRN
Start: 2014-01-07 — End: 2014-01-07
  Administered 2014-01-07: 11 via INTRAVENOUS

## 2014-01-07 MED ORDER — TECHNETIUM TC 99M SESTAMIBI GENERIC - CARDIOLITE
33.0000 | Freq: Once | INTRAVENOUS | Status: AC | PRN
  Administered 2014-01-07: 33 via INTRAVENOUS

## 2014-01-07 MED ORDER — REGADENOSON 0.4 MG/5ML IV SOLN
0.4000 mg | Freq: Once | INTRAVENOUS | Status: AC
  Administered 2014-01-07: 0.4 mg via INTRAVENOUS

## 2014-01-07 NOTE — Progress Notes (Signed)
[facility] NUCLEAR MED [address] [place], [place] [address] [phone]    Cardiology Nuclear Med Study  [name] is a 80 y.o. male     MRN : [id number]     DOB: 06/27/1931  Procedure Date: 01/07/2014  Nuclear Med Background Indication for Stress Test:  Evaluation for Ischemia, Graft Patency and Stent Patency History:  CAD, MPI 2013 (scar) EF 45% Cardiac Risk Factors: Hypertension and NIDDM  Symptoms:  Chest Pain with Exertion (last date of chest discomfort was one week ago) and SOB   Nuclear Pre-Procedure Caffeine/Decaff Intake:  None> 12 hrs NPO After: 8:00am   Lungs:  clear O2 Sat: 94% on room air. IV 0.9% NS with Angio Cath:  22g  IV Site: R Antecubital x 1, tolerated well IV Started by:  Irven Baltimore, RN  Chest Size (in):  46 Cup Size: n/a  Height: 6\' 1"  (1.854 m)  Weight:  220 lb (99.791 kg)  BMI:  Body mass index is 29.03 kg/(m^2). Tech Comments:  Patient took Coreg and Metformin this am with breakfast. Irven Baltimore, RN.    Nuclear Med Study 1 or 2 day study: 1 day  Stress Test Type:  Treadmill/Lexiscan  Reading MD: N/A  Order Authorizing Provider:  [name], MD  Resting Radionuclide: Technetium 6m Sestamibi  Resting Radionuclide Dose: 11.0 mCi   Stress Radionuclide:  Technetium 59m Sestamibi  Stress Radionuclide Dose: 33.0 mCi           Stress Protocol Rest HR: 49 Stress HR: 67  Rest BP: 127/66 Stress BP: 108/57  Exercise Time (min): n/a METS: n/a           Dose of Adenosine (mg):  n/a Dose of Lexiscan: 0.4 mg  Dose of Atropine (mg): n/a Dose of Dobutamine: n/a mcg/kg/min (at max HR)  Stress Test Technologist: Glade Lloyd, BS-ES  Nuclear Technologist:  Margie Ege     Rest Procedure:  Myocardial perfusion imaging was performed at rest 45 minutes following the intravenous administration of Technetium 32m Sestamibi. Rest ECG: SB, inferior MI, age undetermined.   Stress Procedure:  The patient received IV  Lexiscan 0.4 mg over 15-seconds.  Technetium 54m Sestamibi injected at 30-seconds.  Quantitative spect images were obtained after a 45 minute delay.  During the infusion of Lexiscan the patient complained of SOB and a cough.  These symptoms began to resolve in recovery.  Stress ECG: No significant change from baseline ECG  QPS Raw Data Images:  There is interference from nuclear activity from structures below the diaphragm. This does not affect the ability to read the study. Stress Images:  There is decreased uptake in the entire inferior, and basal and mid inferolateral walls, as well as mid and apical anterior walls and in the true apex. [name]  Rest Images:  There is decreased uptake in the entire inferior, and basal and mid inferolateral walls Subtraction (SDS):  There is ischemia in the mid and apical anterior walls and in the true apex. (SDS 8).  Transient Ischemic Dilatation (Normal <1.22):  1.16 Lung/Heart Ratio (Normal <0.45):  0.30  Quantitative Gated Spect Images QGS EDV:  132 ml QGS ESV:  72 ml  Impression Exercise Capacity:  Lexiscan with low level exercise. BP Response:  Normal blood pressure response. Clinical Symptoms:  There is dyspnea. ECG Impression:  No significant ST segment change suggestive of ischemia. Comparison with Prior Nuclear Study: There is significant change when compared to the study from 2013.  Overall Impression:  High risk stress nuclear study with two perfusion defects: 1. Medium size severe severity ischemia in the mid LAD territory (SDS 8) and 2. Large scar in the entire inferior, and basal and mid inferolateral walls. .  LV Ejection Fraction: 45%.  LV Wall Motion:  Paradoxical septal motion and akinesis of the basal and mid inferior and inferolateral walls.     Dorothy Spark 01/07/2014

## 2014-01-08 ENCOUNTER — Encounter: Payer: Self-pay | Admitting: Physician Assistant

## 2014-01-09 ENCOUNTER — Encounter: Payer: Self-pay | Admitting: *Deleted

## 2014-01-09 ENCOUNTER — Telehealth: Payer: Self-pay | Admitting: *Deleted

## 2014-01-09 DIAGNOSIS — Z01812 Encounter for preprocedural laboratory examination: Secondary | ICD-10-CM

## 2014-01-09 NOTE — Telephone Encounter (Signed)
Message copied by Richmond Campbell on Wed Jan 09, 2014  3:26 PM ------      Message from: [name]      Created: Wed Jan 09, 2014  3:02 PM       Myovue abnormal needs cath set up for [facility], PJ, HS or CM next week   Needs right and left cath as he has dyspnea ------

## 2014-01-09 NOTE — Telephone Encounter (Signed)
PT AWARE OF MYOVIEW RESULTS  RIGHT AND LEFT CATH SCHEDULED FOR MON  01-14-14 12:00  WITH  DR  [place]

## 2014-01-10 ENCOUNTER — Other Ambulatory Visit (INDEPENDENT_AMBULATORY_CARE_PROVIDER_SITE_OTHER): Payer: Medicare Other | Admitting: *Deleted

## 2014-01-10 DIAGNOSIS — Z01812 Encounter for preprocedural laboratory examination: Secondary | ICD-10-CM

## 2014-01-10 DIAGNOSIS — Z01818 Encounter for other preprocedural examination: Secondary | ICD-10-CM

## 2014-01-10 DIAGNOSIS — Z5181 Encounter for therapeutic drug level monitoring: Secondary | ICD-10-CM

## 2014-01-10 LAB — BASIC METABOLIC PANEL
BUN: 26 mg/dL — ABNORMAL HIGH (ref 6–23)
CHLORIDE: 101 meq/L (ref 96–112)
CO2: 28 meq/L (ref 19–32)
CREATININE: 1.9 mg/dL — AB (ref 0.4–1.5)
Calcium: 9.4 mg/dL (ref 8.4–10.5)
GFR: 36.62 mL/min — ABNORMAL LOW (ref >60.00)
Glucose, Bld: 118 mg/dL — ABNORMAL HIGH (ref 70–99)
POTASSIUM: 4.4 meq/L (ref 3.5–5.1)
SODIUM: 138 meq/L (ref 135–145)

## 2014-01-10 LAB — CBC WITH DIFFERENTIAL/PLATELET
BASOS ABS: 0 10*3/uL (ref 0.0–0.1)
Basophils Relative: 0.5 % (ref 0.0–3.0)
Eosinophils Absolute: 0.4 10*3/uL (ref 0.0–0.7)
Eosinophils Relative: 4.6 % (ref 0.0–5.0)
HCT: 38.3 % — ABNORMAL LOW (ref 39.0–52.0)
Hemoglobin: 12.6 g/dL — ABNORMAL LOW (ref 13.0–17.0)
LYMPHS PCT: 24.5 % (ref 12.0–46.0)
Lymphs Abs: 2.2 10*3/uL (ref 0.7–4.0)
MCHC: 32.9 g/dL (ref 30.0–36.0)
MCV: 87.6 fl (ref 78.0–100.0)
Monocytes Absolute: 0.7 10*3/uL (ref 0.1–1.0)
Monocytes Relative: 7.2 % (ref 3.0–12.0)
NEUTROS PCT: 63.2 % (ref 43.0–77.0)
Neutro Abs: 5.7 10*3/uL (ref 1.4–7.7)
Platelets: 181 10*3/uL (ref 150.0–400.0)
RBC: 4.37 Mil/uL (ref 4.22–5.81)
RDW: 15.5 % (ref 11.5–15.5)
WBC: 9 10*3/uL (ref 4.0–10.5)

## 2014-01-10 LAB — PROTIME-INR
INR: 1 ratio (ref 0.8–1.0)
PROTHROMBIN TIME: 11.1 s (ref 9.6–13.1)

## 2014-01-11 ENCOUNTER — Encounter (HOSPITAL_COMMUNITY): Payer: Self-pay | Admitting: Pharmacy Technician

## 2014-01-14 ENCOUNTER — Encounter (HOSPITAL_COMMUNITY): Payer: Self-pay | Admitting: General Practice

## 2014-01-14 ENCOUNTER — Ambulatory Visit (HOSPITAL_COMMUNITY)
Admission: RE | Admit: 2014-01-14 | Discharge: 2014-01-15 | Disposition: A | Discharge status: Home / Self Care | Payer: Medicare Other | Source: Ambulatory Visit | Attending: Cardiology | Admitting: Cardiology

## 2014-01-14 ENCOUNTER — Encounter (HOSPITAL_COMMUNITY)
Admission: RE | Disposition: A | Discharge status: Home / Self Care | Payer: Medicare Other | Source: Ambulatory Visit | Attending: Cardiology

## 2014-01-14 DIAGNOSIS — Z7982 Long term (current) use of aspirin: Secondary | ICD-10-CM | POA: Diagnosis not present

## 2014-01-14 DIAGNOSIS — Z951 Presence of aortocoronary bypass graft: Secondary | ICD-10-CM | POA: Insufficient documentation

## 2014-01-14 DIAGNOSIS — R0609 Other forms of dyspnea: Secondary | ICD-10-CM | POA: Diagnosis present

## 2014-01-14 DIAGNOSIS — I5030 Unspecified diastolic (congestive) heart failure: Secondary | ICD-10-CM | POA: Diagnosis present

## 2014-01-14 DIAGNOSIS — E119 Type 2 diabetes mellitus without complications: Secondary | ICD-10-CM | POA: Insufficient documentation

## 2014-01-14 DIAGNOSIS — I129 Hypertensive chronic kidney disease with stage 1 through stage 4 chronic kidney disease, or unspecified chronic kidney disease: Secondary | ICD-10-CM | POA: Insufficient documentation

## 2014-01-14 DIAGNOSIS — I5022 Chronic systolic (congestive) heart failure: Secondary | ICD-10-CM | POA: Diagnosis present

## 2014-01-14 DIAGNOSIS — M069 Rheumatoid arthritis, unspecified: Secondary | ICD-10-CM | POA: Diagnosis not present

## 2014-01-14 DIAGNOSIS — E7849 Other hyperlipidemia: Secondary | ICD-10-CM | POA: Diagnosis present

## 2014-01-14 DIAGNOSIS — I25810 Atherosclerosis of coronary artery bypass graft(s) without angina pectoris: Secondary | ICD-10-CM

## 2014-01-14 DIAGNOSIS — I5032 Chronic diastolic (congestive) heart failure: Secondary | ICD-10-CM | POA: Insufficient documentation

## 2014-01-14 DIAGNOSIS — N183 Chronic kidney disease, stage 3 (moderate): Secondary | ICD-10-CM | POA: Insufficient documentation

## 2014-01-14 DIAGNOSIS — I200 Unstable angina: Secondary | ICD-10-CM

## 2014-01-14 DIAGNOSIS — I2510 Atherosclerotic heart disease of native coronary artery without angina pectoris: Secondary | ICD-10-CM | POA: Diagnosis present

## 2014-01-14 DIAGNOSIS — N184 Chronic kidney disease, stage 4 (severe): Secondary | ICD-10-CM | POA: Diagnosis present

## 2014-01-14 DIAGNOSIS — Z79899 Other long term (current) drug therapy: Secondary | ICD-10-CM | POA: Insufficient documentation

## 2014-01-14 DIAGNOSIS — E1165 Type 2 diabetes mellitus with hyperglycemia: Secondary | ICD-10-CM

## 2014-01-14 DIAGNOSIS — I25119 Atherosclerotic heart disease of native coronary artery with unspecified angina pectoris: Secondary | ICD-10-CM

## 2014-01-14 DIAGNOSIS — J849 Interstitial pulmonary disease, unspecified: Secondary | ICD-10-CM | POA: Insufficient documentation

## 2014-01-14 DIAGNOSIS — I10 Essential (primary) hypertension: Secondary | ICD-10-CM

## 2014-01-14 DIAGNOSIS — I25110 Atherosclerotic heart disease of native coronary artery with unstable angina pectoris: Secondary | ICD-10-CM | POA: Insufficient documentation

## 2014-01-14 DIAGNOSIS — G4733 Obstructive sleep apnea (adult) (pediatric): Secondary | ICD-10-CM | POA: Insufficient documentation

## 2014-01-14 DIAGNOSIS — I25710 Atherosclerosis of autologous vein coronary artery bypass graft(s) with unstable angina pectoris: Secondary | ICD-10-CM | POA: Insufficient documentation

## 2014-01-14 DIAGNOSIS — Z23 Encounter for immunization: Secondary | ICD-10-CM | POA: Diagnosis not present

## 2014-01-14 DIAGNOSIS — I350 Nonrheumatic aortic (valve) stenosis: Secondary | ICD-10-CM | POA: Diagnosis not present

## 2014-01-14 DIAGNOSIS — K219 Gastro-esophageal reflux disease without esophagitis: Secondary | ICD-10-CM | POA: Diagnosis not present

## 2014-01-14 DIAGNOSIS — E785 Hyperlipidemia, unspecified: Secondary | ICD-10-CM | POA: Diagnosis not present

## 2014-01-14 DIAGNOSIS — E1149 Type 2 diabetes mellitus with other diabetic neurological complication: Secondary | ICD-10-CM

## 2014-01-14 HISTORY — PX: CORONARY ANGIOPLASTY WITH STENT PLACEMENT: SHX49

## 2014-01-14 HISTORY — PX: LEFT AND RIGHT HEART CATHETERIZATION WITH CORONARY ANGIOGRAM: SHX5449

## 2014-01-14 HISTORY — DX: Benign prostatic hyperplasia without lower urinary tract symptoms: N40.0

## 2014-01-14 LAB — POCT I-STAT 3, ART BLOOD GAS (G3+)
ACID-BASE EXCESS: 5 mmol/L — AB (ref 0.0–2.0)
BICARBONATE: 30.8 meq/L — AB (ref 20.0–24.0)
O2 Saturation: 98 %
TCO2: 32 mmol/L (ref 0–100)
pCO2 arterial: 50.7 mmHg — ABNORMAL HIGH (ref 35.0–45.0)
pH, Arterial: 7.391 (ref 7.350–7.450)
pO2, Arterial: 112 mmHg — ABNORMAL HIGH (ref 80.0–100.0)

## 2014-01-14 LAB — POCT I-STAT 3, VENOUS BLOOD GAS (G3P V)
ACID-BASE EXCESS: 5 mmol/L — AB (ref 0.0–2.0)
Bicarbonate: 31.3 mEq/L — ABNORMAL HIGH (ref 20.0–24.0)
O2 SAT: 72 %
PCO2 VEN: 53.7 mmHg — AB (ref 45.0–50.0)
TCO2: 33 mmol/L (ref 0–100)
pH, Ven: 7.374 — ABNORMAL HIGH (ref 7.250–7.300)
pO2, Ven: 39 mmHg (ref 30.0–45.0)

## 2014-01-14 LAB — GLUCOSE, CAPILLARY
GLUCOSE-CAPILLARY: 148 mg/dL — AB (ref 70–99)
GLUCOSE-CAPILLARY: 122 mg/dL — AB (ref 70–99)
Glucose-Capillary: 141 mg/dL — ABNORMAL HIGH (ref 70–99)
Glucose-Capillary: 281 mg/dL — ABNORMAL HIGH (ref 70–99)
Glucose-Capillary: 253 mg/dL — ABNORMAL HIGH (ref 70–99)

## 2014-01-14 LAB — POCT ACTIVATED CLOTTING TIME
ACTIVATED CLOTTING TIME: 321 s
Activated Clotting Time: 174 seconds

## 2014-01-14 SURGERY — LEFT AND RIGHT HEART CATHETERIZATION WITH CORONARY ANGIOGRAM
Anesthesia: LOCAL

## 2014-01-14 MED ORDER — SODIUM CHLORIDE 0.9 % IV SOLN: 250.0000 mL | INTRAVENOUS | Status: DC | PRN

## 2014-01-14 MED ORDER — ASPIRIN 81 MG PO CHEW
CHEWABLE_TABLET | ORAL | Status: AC
  Filled 2014-01-14: qty 1

## 2014-01-14 MED ORDER — NORTRIPTYLINE HCL 25 MG PO CAPS
25.0000 mg | ORAL_CAPSULE | Freq: Every day | ORAL | Status: DC
  Administered 2014-01-14: 22:00:00 25 mg via ORAL
  Filled 2014-01-14 (×2): qty 1

## 2014-01-14 MED ORDER — SODIUM CHLORIDE 0.9 % IJ SOLN: 3.0000 mL | Freq: Two times a day (BID) | INTRAMUSCULAR | Status: DC

## 2014-01-14 MED ORDER — PRAVASTATIN SODIUM 40 MG PO TABS
40.0000 mg | ORAL_TABLET | Freq: Every day | ORAL | Status: DC
  Administered 2014-01-14: 22:00:00 40 mg via ORAL
  Filled 2014-01-14 (×2): qty 1

## 2014-01-14 MED ORDER — ADULT MULTIVITAMIN W/MINERALS CH
1.0000 | ORAL_TABLET | Freq: Every day | ORAL | Status: DC
  Administered 2014-01-15: 1 via ORAL
  Filled 2014-01-14 (×2): qty 1

## 2014-01-14 MED ORDER — ASPIRIN EC 81 MG PO TBEC
81.0000 mg | DELAYED_RELEASE_TABLET | Freq: Every day | ORAL | Status: DC
  Administered 2014-01-14: 81 mg via ORAL
  Filled 2014-01-14 (×2): qty 1

## 2014-01-14 MED ORDER — ATROPINE SULFATE 0.1 MG/ML IJ SOLN
INTRAMUSCULAR | Status: AC
  Filled 2014-01-14: qty 10

## 2014-01-14 MED ORDER — SODIUM CHLORIDE 0.9 % IV SOLN: 1.0000 mL/kg/h | INTRAVENOUS | Status: AC

## 2014-01-14 MED ORDER — MIDAZOLAM HCL 2 MG/2ML IJ SOLN
INTRAMUSCULAR | Status: AC
  Filled 2014-01-14: qty 2

## 2014-01-14 MED ORDER — NITROGLYCERIN 1 MG/10 ML FOR IR/CATH LAB
INTRA_ARTERIAL | Status: AC
  Filled 2014-01-14: qty 10

## 2014-01-14 MED ORDER — GABAPENTIN 400 MG PO CAPS
1200.0000 mg | ORAL_CAPSULE | Freq: Every day | ORAL | Status: DC
  Administered 2014-01-14: 1200 mg via ORAL
  Filled 2014-01-14 (×2): qty 3

## 2014-01-14 MED ORDER — BIVALIRUDIN 250 MG IV SOLR
INTRAVENOUS | Status: AC
  Filled 2014-01-14: qty 250

## 2014-01-14 MED ORDER — AMLODIPINE BESYLATE 10 MG PO TABS
10.0000 mg | ORAL_TABLET | Freq: Every day | ORAL | Status: DC
  Administered 2014-01-15: 09:00:00 10 mg via ORAL
  Filled 2014-01-14 (×2): qty 1

## 2014-01-14 MED ORDER — ISOSORBIDE MONONITRATE ER 30 MG PO TB24
30.0000 mg | ORAL_TABLET | Freq: Every day | ORAL | Status: DC
  Administered 2014-01-14: 30 mg via ORAL
  Filled 2014-01-14 (×2): qty 1

## 2014-01-14 MED ORDER — VITAMIN B-12 1000 MCG PO TABS
1000.0000 ug | ORAL_TABLET | Freq: Every day | ORAL | Status: DC
  Administered 2014-01-14 – 2014-01-15 (×2): 1000 ug via ORAL
  Filled 2014-01-14 (×2): qty 1

## 2014-01-14 MED ORDER — TICAGRELOR 90 MG PO TABS
90.0000 mg | ORAL_TABLET | Freq: Two times a day (BID) | ORAL | Status: DC
  Administered 2014-01-15 (×2): 90 mg via ORAL
  Filled 2014-01-14 (×4): qty 1

## 2014-01-14 MED ORDER — LIDOCAINE HCL (PF) 1 % IJ SOLN
INTRAMUSCULAR | Status: AC
  Filled 2014-01-14: qty 30

## 2014-01-14 MED ORDER — SODIUM CHLORIDE 0.9 % IJ SOLN: 3.0000 mL | INTRAMUSCULAR | Status: DC | PRN

## 2014-01-14 MED ORDER — HEPARIN SODIUM (PORCINE) 1000 UNIT/ML IJ SOLN
INTRAMUSCULAR | Status: AC
  Filled 2014-01-14: qty 1

## 2014-01-14 MED ORDER — FUROSEMIDE 40 MG PO TABS
40.0000 mg | ORAL_TABLET | Freq: Every day | ORAL | Status: DC
  Administered 2014-01-15: 40 mg via ORAL
  Filled 2014-01-14 (×2): qty 1

## 2014-01-14 MED ORDER — SODIUM CHLORIDE 0.9 % IV SOLN
INTRAVENOUS | Status: DC
Start: 2014-01-14 — End: 2014-01-14
  Administered 2014-01-14: 11:00:00 via INTRAVENOUS

## 2014-01-14 MED ORDER — TICAGRELOR 90 MG PO TABS
ORAL_TABLET | ORAL | Status: AC
  Administered 2014-01-15: 90 mg via ORAL
  Filled 2014-01-14: qty 1

## 2014-01-14 MED ORDER — GABAPENTIN 300 MG PO CAPS
900.0000 mg | ORAL_CAPSULE | ORAL | Status: DC
  Administered 2014-01-15: 09:00:00 900 mg via ORAL
  Filled 2014-01-14 (×2): qty 3

## 2014-01-14 MED ORDER — TICAGRELOR 90 MG PO TABS
ORAL_TABLET | ORAL | Status: AC
  Filled 2014-01-14: qty 1

## 2014-01-14 MED ORDER — ACETAMINOPHEN 325 MG PO TABS: 650.0000 mg | ORAL_TABLET | ORAL | Status: DC | PRN

## 2014-01-14 MED ORDER — FENTANYL CITRATE 0.05 MG/ML IJ SOLN
INTRAMUSCULAR | Status: AC
  Filled 2014-01-14: qty 2

## 2014-01-14 MED ORDER — VERAPAMIL HCL 2.5 MG/ML IV SOLN
INTRAVENOUS | Status: AC
  Filled 2014-01-14: qty 2

## 2014-01-14 MED ORDER — ONDANSETRON HCL 4 MG/2ML IJ SOLN: 4.0000 mg | Freq: Four times a day (QID) | INTRAMUSCULAR | Status: DC | PRN

## 2014-01-14 MED ORDER — INFLUENZA VAC SPLIT QUAD 0.5 ML IM SUSY
0.5000 mL | PREFILLED_SYRINGE | INTRAMUSCULAR | Status: AC
  Administered 2014-01-15: 09:00:00 0.5 mL via INTRAMUSCULAR
  Filled 2014-01-14: qty 0.5

## 2014-01-14 MED ORDER — ASPIRIN 81 MG PO CHEW
81.0000 mg | CHEWABLE_TABLET | ORAL | Status: AC
  Administered 2014-01-14: 81 mg via ORAL

## 2014-01-14 MED ORDER — AZATHIOPRINE 50 MG PO TABS
50.0000 mg | ORAL_TABLET | Freq: Every day | ORAL | Status: DC
  Administered 2014-01-15: 09:00:00 50 mg via ORAL
  Filled 2014-01-14 (×2): qty 1

## 2014-01-14 MED ORDER — HYDRALAZINE HCL 20 MG/ML IJ SOLN
10.0000 mg | INTRAMUSCULAR | Status: DC | PRN
  Administered 2014-01-14: 10 mg via INTRAVENOUS
  Filled 2014-01-14: qty 1

## 2014-01-14 MED ORDER — PANTOPRAZOLE SODIUM 40 MG PO TBEC
40.0000 mg | DELAYED_RELEASE_TABLET | Freq: Every day | ORAL | Status: DC
Start: 2014-01-14 — End: 2014-01-15
  Administered 2014-01-14 – 2014-01-15 (×2): 40 mg via ORAL
  Filled 2014-01-14 (×2): qty 1

## 2014-01-14 MED ORDER — NITROGLYCERIN 0.4 MG SL SUBL: 0.4000 mg | SUBLINGUAL_TABLET | SUBLINGUAL | Status: DC | PRN

## 2014-01-14 MED ORDER — HEPARIN (PORCINE) IN NACL 2-0.9 UNIT/ML-% IJ SOLN
INTRAMUSCULAR | Status: AC
  Filled 2014-01-14: qty 1000

## 2014-01-14 MED ORDER — LOSARTAN POTASSIUM 50 MG PO TABS
100.0000 mg | ORAL_TABLET | Freq: Every day | ORAL | Status: DC
  Administered 2014-01-15: 09:00:00 100 mg via ORAL
  Filled 2014-01-14: qty 2

## 2014-01-14 MED ORDER — CARVEDILOL 12.5 MG PO TABS
12.5000 mg | ORAL_TABLET | Freq: Two times a day (BID) | ORAL | Status: DC
  Administered 2014-01-14 – 2014-01-15 (×2): 12.5 mg via ORAL
  Filled 2014-01-14 (×3): qty 1

## 2014-01-14 NOTE — H&P (View-Only) (Signed)
Cardiology Office Note    Date:  01/02/2014   ID:  [name], [name] 09-13-1931, MRN [id number]  PCP:  Viviana Simpler, MD  Cardiologist:  Dr. [name]     History of Present Illness: [name] is a 80 y.o. male with a hx of CAD, s/p CABG, interstitial lung disease, mild aortic stenosis, HTN, HL, DM2, CKD, rheumatoid arthritis. He has chronic dyspnea. He had unstable angina in 9/13 and underwent DES to the [facility] and cutting POBA to ostial PDA.    Followed by Dr. Chase Caller for ILD.  Recent PFTs without significant change (FEV1 56% , ratio 66, FVC 61%).  Recent echo showed normal BiV function and just mild AS.  He has desats at night and is now on O2.  Plan is to proceed with CPX (bike) to further assess his dyspnea.  But, patient asked to FU here first.    Patient notes chronic exertional chest discomfort. He has had this for years without significant change. He also notes chronic dyspnea. He is NYHA class III. He denies orthopnea, PND or edema. He denies any radiating symptoms with his chest pain. He denies syncope.   Studies:  - Nuclear (2/13):  Inferior scar, no ischemia, EF 45%, low-risk  - LHC 12/31/11: dLM 80%, LAD 100%, circumflex 100%, RCA 100%, proximal SVG-OM 30%, SVG-D1 ok, SVG-RCA 99% distal and 80% ostial PDA distal to graft, LIMA-LAD ok with some left to right collaterals. >> PCI 01/03/12: Promus DES to the SVG-RCA and Cutting Balloon angioplasty to the ostial PDA.  - Echo (7/15): Mild LVH, EF 60-65%, normal wall motion, grade 1 diastolic dysfunction, mild aortic stenosis (mean 14 mm Hg), normal RV function   Recent Labs/Images:  02/19/2013: ALT 12; AST 19  02/24/2013: Hemoglobin 12.9*; Pro B Natriuretic peptide (BNP) 130.5; WBC 7.0  02/25/2013: HDL Cholesterol by NMR 30*; LDL (calc) 76  06/21/2013: BUN 25*; Creatinine 1.8*; Potassium 3.7; Sodium 138   09/10/2013: Hemoglobin-A1c 7.6*   Wt Readings from Last 3 Encounters:  01/02/14 224 lb (101.606 kg)    12/17/13 219 lb (99.338 kg)  11/26/13 227 lb (102.967 kg)     Past Medical History  Diagnosis Date  . Hyperlipidemia   . Hypertension   . GERD (gastroesophageal reflux disease)   . Arthritis     osteoarthritis, s/p R TKR, and digits  . Overweight (BMI 25.0-29.9)     BMI 29  . Interstitial lung disease     NOS  . Pulmonary infiltrates 12-09    stable, 7/10: ? due to GERD  . Diverticulosis   . Esophageal stricture     s/p dilation spring 2010  . CAD (coronary artery disease)     symptoms followed lopressor Jan-Jul 09 and lisinopril increase July 09, 1/09 Myoview- non ischemic with an EF of 51% and inferobasal wall scar, 10/09 Cath- severe native three-vessel coronary artery disease, s/p multivessel coronary bypass graft with all grafts, Normal LV sys fn with normal LV filling pressures; 10/04-Adenosine cardiolite- thinning wo ischemia, EF 55%; 1/08 Cardiolite neg  . History of PFTs     mixed pattern on spiro. mild restn on lung volumes with near normal DLCO. Pattern can be explained by CABG scar. Fev1 2.2L/73%, ratio 68 (67), TLC 4.7/68%,RV 1.5L/55%,DLCO 79%  . Colon polyps   . Chronic kidney disease, stage III (moderate)   . Chronic diastolic CHF (congestive heart failure)     a) 09/13 ECHO- LVEF 54-65%, grade 1 diastolic dysfunction, mild LA dilatation,  atrial septal aneurysm, AV mobility restricted, but no sig AS by doppler; b) 09/04/08 ECHO- LVH, ef 60%, mild AS,  . Heart murmur   . Type II diabetes mellitus   . Chronic lower back pain   . RA (rheumatoid arthritis)     [name]  . Myocardial infarction     pci  . Dyspnea 2009 since July -Sept    05/06/08-CPST-  normal effort, reduced VO2 max 20.5 /65%, reduced at 8.2/ 40%, normal breathing resetvca of 55%, submaximal heart rate response 112/77%, flattened o2 pluse response at peak exercise-12 ml/beat @ 85%, No VQ mismatch abnormalities, All c/w CIRC Limitation  . Hiatal hernia   . Anginal pain 02/24/13  . Seropositive  rheumatoid arthritis     Current Outpatient Prescriptions  Medication Sig Dispense Refill  . amLODipine (NORVASC) 10 MG tablet Take 10 mg by mouth daily.      [name] aspirin 81 MG tablet Take 81 mg by mouth daily.        [name] azaTHIOprine (IMURAN) 50 MG tablet Take 50 mg by mouth daily.       . Blood Glucose Monitoring Suppl ([place]) W/DEVICE KIT Used to test blood sugar once daily dx: 250.60  1 each  0  . carvedilol (COREG) 12.5 MG tablet TAKE 1 TABLET BY MOUTH TWICE A DAY WITH A MEAL  60 tablet  1  . DHA-Vitamin C-Lutein (EYE HEALTH FORMULA PO) Take by mouth.      . esomeprazole (NEXIUM) 20 MG capsule Take 20 mg by mouth daily.       . furosemide (LASIX) 40 MG tablet Take 1-2 tablets (40-80 mg total) by mouth daily.  60 tablet  11  . gabapentin (NEURONTIN) 300 MG capsule TAKE 3 CAPSULES BY MOUTH IN THE MORNING AND 4-5 CAPSULES AT BEDTIME  240 capsule  3  . glucose blood (ONE TOUCH TEST STRIPS) test strip Used to test blood sugar once daily dx: 250.60  100 each  1  . isosorbide mononitrate (IMDUR) 30 MG 24 hr tablet TAKE 1 TABLET BY MOUTH DAILY  30 tablet  3  . losartan (COZAAR) 100 MG tablet TAKE 1 TABLET (100 MG TOTAL) BY MOUTH DAILY.  90 tablet  3  . metFORMIN (GLUCOPHAGE) 1000 MG tablet TAKE 1 TABLET BY MOUTH TWICE A DAY WITH A MEAL  60 tablet  11  . Multiple Vitamins-Minerals (ICAPS) CAPS Take 1 capsule by mouth daily.       [name] NITROSTAT 0.4 MG SL tablet DISSOLVE 1 TABLET UNDER THE TONGUE FOR CHEST PAIN. MAY REPEAT EVERY 5MINUTES UP TO 3 DOSES. CALL 911 IF NOT RESOLVED.  25 tablet  4  . nortriptyline (PAMELOR) 25 MG capsule Take 1-2 capsules (25-50 mg total) by mouth at bedtime.  60 capsule  11  . ONE TOUCH LANCETS MISC Used to test blood sugar once daily dx: 250.60  100 each  1  . pravastatin (PRAVACHOL) 40 MG tablet TAKE 1 TABLET BY MOUTH DAILY  30 tablet  11  . vitamin B-12 (CYANOCOBALAMIN) 1000 MCG tablet Take 1,000 mcg by mouth daily.       No current facility-administered  medications for this visit.     Allergies:   Doxazosin mesylate and Methocarbamol   Social History:  The patient  reports that he quit smoking about 50 years ago. His smoking use included Cigarettes. He has a 20 pack-year smoking history. He has never used smokeless tobacco. He reports that he does not drink  alcohol or use illicit drugs.   Family History:  The patient's family history includes Alcohol abuse in his sister; COPD in his mother; Colon cancer in his brother; Diabetes in his brother; Heart attack in his father; Heart disease in his father; Stroke in his sister.   ROS:  Please see the history of present illness.       All other systems reviewed and negative.    PHYSICAL EXAM: VS:  BP 140/70  Pulse 65  Ht _0  (1.854 m)  Wt 224 lb (101.606 kg)  BMI 29.56 kg/m2 Well nourished, well developed, in no acute distress HEENT: normal Neck:  no JVD Cardiac:  normal S1, S2;  RRR; 9-0/2 systolic murmur RUSB Lungs:  Bibasilar crackles (chronic), no wheezes Abd: soft, nontender, no hepatomegaly Ext: trace bilateral ankle edema Skin: warm and dry Neuro:  CNs 2-12 intact, no focal abnormalities noted  EKG:  NSR, HR 65, inferior Q waves, no change from prior tracing      ASSESSMENT AND PLAN:  1. CAD:  Patient has had chronic chest pain. He has been recently seen by pulmonology for dyspnea. Plan is to proceed with cardiopulmonary stress test after evaluation by cardiology. Patient also has chronic dyspnea. It has been 2 years since his last assessment for ischemia. Arrange Lexiscan Myoview to rule out large areas of ischemia.  Continue aspirin, beta blocker, statin. 2. Diastolic CHF:  Volume appears stable. Check BNP today. Recent echocardiogram with normal LV function 3. Aortic stenosis:  Mild by recent echocardiogram. 4. HTN:  Controlled. 5. Hyperlipidemia:  Continue statin. 6. Interstitial lung disease:  Follow up with pulmonology. 7. Chronic kidney disease:  Creatinine stable by  most recent measurement. 8. Diabetes mellitus:   Fair control.  Last A1c 7.6.    Disposition:    FU with Dr. Johnsie Cancel or me in one month.   Signed, Versie Starks, MHS 01/02/2014 10:06 AM    [facility] [place], [place], [place]  [address] Phone: [phone]; Fax: [phone]

## 2014-01-14 NOTE — CV Procedure (Signed)
Cardiac Catheterization Procedure Note  Name: [name] MRN: [id number] DOB: 1931-09-26  Procedure: Right Heart Cath, Left Heart Cath, Selective Coronary Angiography, SVG and LIMA angiography, PCI with stenting of the SVG to OM.  Indication: 80 yo WM with remote history of CABG in 2001. S/p stenting of SVG to RCA in 2013. Presents with worsening dyspnea on exertion. Myoview study demonstrates inferior wall scar with anterior wall ischemia.  Procedural Details: The right groin repped, draped, and anesthetized with 1% lidocaine. Using the modified Seldinger technique a 5 Fr  sheath was placed in the right femoral artery and a 7 French sheath was placed in the right femoral vein. A Swan-Ganz catheter was used for the right heart catheterization. Standard protocol was followed for recording of right heart pressures and sampling of oxygen saturations. Fick cardiac output was calculated. Standard Judkins catheters were used for selective coronary angiography and left ventriculography. There were no immediate procedural complications.   Procedural Findings: Hemodynamics RA 11/10 mean 8 mm Hg RV 35/11 mm Hg PA 36/9 mean 21 mm Hg PCWP 14/13 mean 11 mm Hg LV 146/14 mm Hg AO 147/63 mean 94 mm Hg.  No significant AV or MV gradient.   Oxygen saturations: PA 72% AO 98%  Cardiac Output (Fick) 6.7 L/min  Cardiac Index (Fick) 3.0 L/min/meter squared.   Coronary angiography: Coronary dominance: right  Left mainstem: 80% distal left main stenosis.   Left anterior descending (LAD): 100% occluded at the ostium.  Left circumflex (LCx): The LCx has diffuse 40% disease in the mid vessel. It gives rise to 4 OM branches. The first OM is small with an 80% ostial stenosis. The second OM is occluded. The third OM is small with a 95% proxmal stenosis. The fourth OM is without obstructive disease.   Right coronary artery (RCA): The RCA is occluded proximally. It fills by various collaterals from  the IMA, LAD, and OM.   The SVG to the RCA is occluded proximally.  The SVG to the diagonal is occluded proximally.  The SVG to the OM2 is patent with an eccentric 80% stenosis in the proximal SVG  The LIMA to the LAD is widely patent.  Left ventriculography: Not done.  Following the diagnostic procedure we decided to proceed with PCI of the SVG to OM2. The femoral sheath was exchanged for a 6 Fr sheath. Brilinta 180 mg was given orally. Weight based Angiomax was given for anticoagulation. Once a therapeutic ACT was obtained a 6 Fr. LA1 guide was inserted. The lesion in the SVG was crossed with a filter wire. The filter was deployed in the mid SVG. The lesion was then primarily stented with a 3.5 x 16 mm Promus stent deploying up to 14 atm. Following PCI there was an excellent angiographic results with 0% residual stenosis and TIMI 3 flow. The filter was retrieved without difficulty. The patient was transferred to the recovery area for further monitoring. The femoral sheaths will be removed later with manual compression.  Vessel: SVG to OM2: proximal body TIMI flow (pre) 3 Stenosis: 80% Stent: 3.5 x 16 mm Promus TIMI flow (post): 3 Stenosis (post) 0%   Final Conclusions:   1. Severe 3 vessel obstructive CAD 2. Patent LIMA to the LAD 3. Patent SVG to the OM2 but with moderate-severe stenosis in proximal body of SVG 4. Occluded SVG to the RCA-new 5. Occluded SVG to the diagonal-new 6. Normal right heart and left ventricular filling pressures.   Recommendations: DAPT for at  least one year. Continue aggressive medical therapy for residual disease.  Peter Martinique, [place] 01/14/2014, 2:05 PM

## 2014-01-14 NOTE — Progress Notes (Signed)
Site area: right groin  Site Prior to Removal:  Level 0  Pressure Applied For 20 MINUTES    Minutes Beginning at 1830  Manual:   Yes.    Patient Status During Pull:  AAO X3 , stable condition  Post Pull Groin Site:  Level 0  Post Pull Instructions Given:  Yes.    Post Pull Pulses Present:  Yes.    Dressing Applied:  Yes.    Comments:  tolerated procedure well

## 2014-01-14 NOTE — Interval H&P Note (Signed)
History and Physical Interval Note:  01/14/2014 12:25 PM  [name]  has presented today for surgery, with the diagnosis of abnormal myoview, dysmia  The various methods of treatment have been discussed with the patient and family. After consideration of risks, benefits and other options for treatment, the patient has consented to  Procedure(s): LEFT AND RIGHT HEART CATHETERIZATION WITH CORONARY ANGIOGRAM (N/A) as a surgical intervention .  The patient's history has been reviewed, patient examined, no change in status, stable for surgery.  I have reviewed the patient's chart and labs.  Questions were answered to the patient's satisfaction.    Cath Lab Visit (complete for each Cath Lab visit)  Clinical Evaluation Leading to the Procedure:   ACS: No.  Non-ACS:    Anginal Classification: CCS III  Anti-ischemic medical therapy: Maximal Therapy (2 or more classes of medications)  Non-Invasive Test Results: High-risk stress test findings: cardiac mortality >3%/year  Prior CABG: Previous CABG       [name] [facility] 01/14/2014 12:25 PM

## 2014-01-14 NOTE — Care Management Note (Addendum)
  Page 1 of 1   01/15/2014     12:16:03 PM CARE MANAGEMENT NOTE 01/15/2014  Patient:  SANJIV, PETIT   Account Number:  [id number]  Date Initiated:  01/14/2014  Documentation initiated by:  Mariann Laster  Subjective/Objective Assessment:   abnormal myoview, dysmia     Action/Plan:   CM to follow for disposition needs   Anticipated DC Date:  01/15/2014   Anticipated DC Plan:  [place]  CM consult  Medication Assistance      Choice offered to / List presented to:             Status of service:  Completed, signed off Medicare Important Message given?   (If response is "NO", the following Medicare IM given date fields will be blank) Date Medicare IM given:   Medicare IM given by:   Date Additional Medicare IM given:   Additional Medicare IM given by:    Discharge Disposition:  HOME/SELF CARE  Per UR Regulation:    If discussed at Long Length of Stay Meetings, dates discussed:    Comments:  Sabre Romberger RN, BSN, MSHL, CCM  Nurse - Case Manager,  (Unit [phone]  01/15/2014 Update: PT COPAY WILL BE $45- NO PRIOR [place] RN, BSN, MSHL, CCM  Nurse - Case Manager,  (Unit [phone]  01/14/2014 Benefits Check:  ticagrelor (BRILINTA) tablet 90 mg  BID - In progress.

## 2014-01-14 NOTE — Progress Notes (Signed)
Site area: right brachial  Site Prior to Removal:  Level 0  Pressure Applied For 10  MINUTES    Minutes Beginning at 1815  Manual:   Yes.    Patient Status During Pull:  AAO X 4  Post Pull Groin Site:  Level 0  Post Pull Instructions Given:  Yes.    Post Pull Pulses Present:  Yes.    Dressing Applied:  Yes.    Comments:  Tolerated procedure well

## 2014-01-15 ENCOUNTER — Telehealth: Payer: Self-pay | Admitting: Cardiovascular Disease

## 2014-01-15 ENCOUNTER — Encounter (HOSPITAL_COMMUNITY): Payer: Self-pay | Admitting: Physician Assistant

## 2014-01-15 DIAGNOSIS — I200 Unstable angina: Secondary | ICD-10-CM | POA: Insufficient documentation

## 2014-01-15 DIAGNOSIS — E785 Hyperlipidemia, unspecified: Secondary | ICD-10-CM

## 2014-01-15 DIAGNOSIS — I25119 Atherosclerotic heart disease of native coronary artery with unspecified angina pectoris: Secondary | ICD-10-CM

## 2014-01-15 DIAGNOSIS — I10 Essential (primary) hypertension: Secondary | ICD-10-CM

## 2014-01-15 DIAGNOSIS — I350 Nonrheumatic aortic (valve) stenosis: Secondary | ICD-10-CM

## 2014-01-15 DIAGNOSIS — G4733 Obstructive sleep apnea (adult) (pediatric): Secondary | ICD-10-CM | POA: Diagnosis not present

## 2014-01-15 DIAGNOSIS — I25110 Atherosclerotic heart disease of native coronary artery with unstable angina pectoris: Secondary | ICD-10-CM | POA: Diagnosis not present

## 2014-01-15 DIAGNOSIS — I25710 Atherosclerosis of autologous vein coronary artery bypass graft(s) with unstable angina pectoris: Secondary | ICD-10-CM | POA: Diagnosis not present

## 2014-01-15 LAB — BASIC METABOLIC PANEL
Anion gap: 14 (ref 5–15)
BUN: 22 mg/dL (ref 6–23)
CALCIUM: 9.3 mg/dL (ref 8.4–10.5)
CO2: 28 mEq/L (ref 19–32)
Chloride: 104 mEq/L (ref 96–112)
Creatinine, Ser: 1.58 mg/dL — ABNORMAL HIGH (ref 0.50–1.35)
GFR calc Af Amer: 46 mL/min — ABNORMAL LOW (ref >90)
GFR, EST NON AFRICAN AMERICAN: 40 mL/min — AB (ref >90)
Glucose, Bld: 128 mg/dL — ABNORMAL HIGH (ref 70–99)
Potassium: 4.2 mEq/L (ref 3.7–5.3)
Sodium: 146 mEq/L (ref 137–147)

## 2014-01-15 LAB — CBC
HCT: 35.5 % — ABNORMAL LOW (ref 39.0–52.0)
Hemoglobin: 12.1 g/dL — ABNORMAL LOW (ref 13.0–17.0)
MCH: 29.1 pg (ref 26.0–34.0)
MCHC: 34.1 g/dL (ref 30.0–36.0)
MCV: 85.3 fL (ref 78.0–100.0)
PLATELETS: 167 10*3/uL (ref 150–400)
RBC: 4.16 MIL/uL — ABNORMAL LOW (ref 4.22–5.81)
RDW: 14.9 % (ref 11.5–15.5)
WBC: 7.8 10*3/uL (ref 4.0–10.5)

## 2014-01-15 LAB — GLUCOSE, CAPILLARY: Glucose-Capillary: 129 mg/dL — ABNORMAL HIGH (ref 70–99)

## 2014-01-15 MED ORDER — METFORMIN HCL 1000 MG PO TABS: 1000.0000 mg | ORAL_TABLET | Freq: Two times a day (BID) | ORAL | Status: DC

## 2014-01-15 MED ORDER — GI COCKTAIL ~~LOC~~: 30.0000 mL | Freq: Once | ORAL | Status: DC

## 2014-01-15 MED ORDER — TICAGRELOR 90 MG PO TABS: 90.0000 mg | ORAL_TABLET | Freq: Two times a day (BID) | ORAL | Status: DC

## 2014-01-15 MED FILL — Sodium Chloride IV Soln 0.9%: INTRAVENOUS | Qty: 50 | Status: AC

## 2014-01-15 NOTE — Progress Notes (Signed)
CARDIAC REHAB PHASE I   PRE:  Rate/Rhythm: 70 SR  BP:  Supine: 168/68  Sitting:   Standing:    SaO2:   MODE:  Ambulation: 550 ft   POST:  Rate/Rhythm: 78 SR  BP:  Supine:   Sitting: 178/77  Standing:    SaO2:  [id number] Pt tolerated ambulation well without c/o of cp or SOB. BP up before and after walk. Completed stent discharge education. Pt voices understanding. Pt declines Outpt. CRP due to not being able to afford co-pay. He has a Higher education careers adviser at [employer] and plans to exercise there.  Rodney Langton RN 01/15/2014 8:46 AM

## 2014-01-15 NOTE — Discharge Summary (Signed)
Discharge Summary   Patient ID: [name] MRN: [id number], DOB/AGE: 1932/02/09 80 y.o. Admit date: 01/14/2014 D/C date:     01/15/2014  Primary Cardiologist: Dr. Johnsie Cancel  Principal Problem:   Unstable angina Active Problems:   HLD (hyperlipidemia)   Obstructive sleep apnea   Essential hypertension   Aortic stenosis   GERD   Chronic diastolic heart failure   ILD (interstitial lung disease)   Chronic kidney disease, stage III (moderate)   Type II diabetes mellitus   CAD (coronary artery disease)    Admission Dates: 01/14/14- 01/15/14 Discharge Diagnosis: [place] s/p successful placement of DES to SVG to OM2   HPI: [name] is a 80 y.o. male with a hx of CAD: s/p CABG (2001): s/p DES to RCA and cutting POBA to ostial PDA (2013), interstitial lung disease, mild aortic stenosis, HTN, HL, DM2, CKD, rheumatoid arthritis and chronic dyspnea who presented to [facility] on 01/14/14 for planned LHC after an abnormal Lexiscan myoview.     Hospital Course  CAD/Unstable angina: Patient has had chronic chest pain. He is followed by pulmonary for dyspnea and patient wanted cardiology eval prior to proceeding with CPX test. He underwent Lexiscan myoview on 01/08/14 which returned as high risk with medium size severe ischemia in mLAD territory and large scar in entire inferior, basal and mid- inferolat walls. EF 45%. He was then referred for cardiac cath.  -- He underwent coronary angiography on 01/14/14 which revealed   1. Severe 3 vessel obstructive CAD   2. Patent LIMA to the LAD   3. Patent SVG to the OM2 but with moderate-severe stenosis in proximal body of SVG   4. Occluded SVG to the RCA-new   5. Occluded SVG to the diagonal-new   6. Normal right heart and left ventricular filling pressures.  -- Now s/p successful placement of DES to SVG to OM2  -- Continue aspirin/Brilinta for at least 1 year, Coreg 12.5 mg BID and pravastatin.   Diastolic CHF: Volume appears stable.  --Echo  (7/15): Mild LVH, EF 60-65%, normal wall motion, grade 1 diastolic dysfunction, mild aortic stenosis (mean 14 mm Hg), normal RV function  -- Continue Lasix 29m po qd.   Aortic stenosis: Mild by recent echocardiogram.   HTN: Controlled.  -- Continue amlodipine 128m Coreg 12.5 mg BID, losartan 10057mo qd.   Hyperlipidemia: Continue statin.   Interstitial lung disease: Follow up with pulmonology.   Chronic kidney disease: Creatinine stable after cath at 1.58, which is down from 1.9 on 01/10/14   Diabetes mellitus: Fair control. Last A1c 7.6.  -- Hold metformin at least 48 hours post cardiac cath.     The patient has had an uncomplicated hospital course and is recovering well. The  femoral catheter site is stable. He has been seen by Dr. VarIrish Lackday and deemed ready for discharge home. All follow-up appointments have been scheduled. A written RX for a 30 day free supply of Brilinta was provided for the patient.  Discharge medications are listed below.   Discharge Vitals: Blood pressure 143/73, pulse 70, temperature 97.3 F (36.3 C), temperature source Oral, resp. rate 18, height _0  (1.854 m), weight 220 lb 7.4 oz (100 kg), SpO2 95.00%.  Labs: Lab Results  Component Value Date   WBC 7.8 01/15/2014   HGB 12.1* 01/15/2014   HCT 35.5* 01/15/2014   MCV 85.3 01/15/2014   PLT 167 01/15/2014     Recent Labs Lab 01/15/14 0421  NA 146  K 4.2  CL 104  CO2 28  BUN 22  CREATININE 1.58*  CALCIUM 9.3  GLUCOSE 128*    Diagnostic Studies/Procedures    Cardiac Catheterization Procedure Note  Name: [name]  MRN: [id number]  DOB: 11-03-1931  Procedure: Right Heart Cath, Left Heart Cath, Selective Coronary Angiography, SVG and LIMA angiography, PCI with stenting of the SVG to OM.  Indication: 80 yo WM with remote history of CABG in 2001. S/p stenting of SVG to RCA in 2013. Presents with worsening dyspnea on exertion. Myoview study demonstrates inferior wall scar with  anterior wall ischemia.  Procedural Details: The right groin repped, draped, and anesthetized with 1% lidocaine. Using the modified Seldinger technique a 5 Fr sheath was placed in the right femoral artery and a 7 French sheath was placed in the right femoral vein. A Swan-Ganz catheter was used for the right heart catheterization. Standard protocol was followed for recording of right heart pressures and sampling of oxygen saturations. Fick cardiac output was calculated. Standard Judkins catheters were used for selective coronary angiography and left ventriculography. There were no immediate procedural complications.  Procedural Findings:  Hemodynamics  RA 11/10 mean 8 mm Hg  RV 35/11 mm Hg  PA 36/9 mean 21 mm Hg  PCWP 14/13 mean 11 mm Hg  LV 146/14 mm Hg  AO 147/63 mean 94 mm Hg.  No significant AV or MV gradient.  Oxygen saturations:  PA 72%  AO 98%  Cardiac Output (Fick) 6.7 L/min  Cardiac Index (Fick) 3.0 L/min/meter squared.  Coronary angiography:  Coronary dominance: right  Left mainstem: 80% distal left main stenosis.  Left anterior descending (LAD): 100% occluded at the ostium.  Left circumflex (LCx): The LCx has diffuse 40% disease in the mid vessel. It gives rise to 4 OM branches. The first OM is small with an 80% ostial stenosis. The second OM is occluded. The third OM is small with a 95% proxmal stenosis. The fourth OM is without obstructive disease.  Right coronary artery (RCA): The RCA is occluded proximally. It fills by various collaterals from the IMA, LAD, and OM.  The SVG to the RCA is occluded proximally.  The SVG to the diagonal is occluded proximally.  The SVG to the OM2 is patent with an eccentric 80% stenosis in the proximal SVG  The LIMA to the LAD is widely patent.  Left ventriculography: Not done.  Following the diagnostic procedure we decided to proceed with PCI of the SVG to OM2. The femoral sheath was exchanged for a 6 Fr sheath. Brilinta 180 mg was given orally.  Weight based Angiomax was given for anticoagulation. Once a therapeutic ACT was obtained a 6 Fr. LA1 guide was inserted. The lesion in the SVG was crossed with a filter wire. The filter was deployed in the mid SVG. The lesion was then primarily stented with a 3.5 x 16 mm Promus stent deploying up to 14 atm. Following PCI there was an excellent angiographic results with 0% residual stenosis and TIMI 3 flow. The filter was retrieved without difficulty. The patient was transferred to the recovery area for further monitoring. The femoral sheaths will be removed later with manual compression.  Vessel: SVG to OM2: proximal body  TIMI flow (pre) 3  Stenosis: 80%  Stent: 3.5 x 16 mm Promus  TIMI flow (post): 3  Stenosis (post) 0%  Final Conclusions:  1. Severe 3 vessel obstructive CAD  2. Patent LIMA to the LAD  3. Patent SVG to the  OM2 but with moderate-severe stenosis in proximal body of SVG  4. Occluded SVG to the RCA-new  5. Occluded SVG to the diagonal-new  6. Normal right heart and left ventricular filling pressures.  Recommendations: DAPT for at least one year. Continue aggressive medical therapy for residual disease      Nuclear stress test: 01/08/14  Overall Impression: High risk stress nuclear study with two perfusion defects: 1. Medium size severe severity ischemia in the mid LAD territory (SDS 8) and 2. Large scar in the entire inferior, and basal and mid inferolateral walls. .  LV Ejection Fraction: 45%. LV Wall Motion: Paradoxical septal motion and akinesis of the basal and mid inferior and inferolateral walls.    .  Discharge Medications     Medication List         amLODipine 10 MG tablet  Commonly known as:  NORVASC  Take 10 mg by mouth daily.     aspirin EC 81 MG tablet  Take 81 mg by mouth at bedtime.     azaTHIOprine 50 MG tablet  Commonly known as:  IMURAN  Take 50 mg by mouth daily.     carvedilol 12.5 MG tablet  Commonly known as:  COREG  Take 12.5 mg by  mouth 2 (two) times daily with a meal.     diclofenac sodium 1 % Gel  Commonly known as:  VOLTAREN  Apply 1 application topically daily. Apply to fingers and toes     esomeprazole 20 MG capsule  Commonly known as:  NEXIUM  Take 20 mg by mouth daily.     EYE HEALTH FORMULA PO  Take 1 tablet by mouth daily.     furosemide 40 MG tablet  Commonly known as:  LASIX  Take 40 mg by mouth daily.     gabapentin 300 MG capsule  Commonly known as:  NEURONTIN  Take 900-1,200 mg by mouth 2 (two) times daily. Take 3 capsules (900 mg) every morning and 4 capsules (1200 mg) every night     glucose blood test strip  Commonly known as:  ONE TOUCH TEST STRIPS  Used to test blood sugar once daily dx: 250.60     isosorbide mononitrate 30 MG 24 hr tablet  Commonly known as:  IMDUR  Take 30 mg by mouth at bedtime.     losartan 100 MG tablet  Commonly known as:  COZAAR  Take 100 mg by mouth daily.     metFORMIN 1000 MG tablet  Commonly known as:  GLUCOPHAGE  Take 1 tablet (1,000 mg total) by mouth 2 (two) times daily with a meal.  Start taking on:  01/17/2014     multivitamin with minerals Tabs tablet  Take 1 tablet by mouth daily.     nitroGLYCERIN 0.4 MG SL tablet  Commonly known as:  NITROSTAT  Place 0.4 mg under the tongue every 5 (five) minutes as needed for chest pain. Call 911 if not resolved after 3 doses     nortriptyline 25 MG capsule  Commonly known as:  PAMELOR  Take 25 mg by mouth at bedtime.     ONE TOUCH BASIC SYSTEM W/DEVICE Kit  Used to test blood sugar once daily dx: 250.60     ONE TOUCH LANCETS Misc  Used to test blood sugar once daily dx: 250.60     pravastatin 40 MG tablet  Commonly known as:  PRAVACHOL  Take 40 mg by mouth at bedtime.     ticagrelor 90 MG Tabs tablet  Commonly known as:  BRILINTA  Take 1 tablet (90 mg total) by mouth 2 (two) times daily.     vitamin B-12 1000 MCG tablet  Commonly known as:  CYANOCOBALAMIN  Take 1,000 mcg by mouth daily.          Disposition   The patient will be discharged in stable condition to home.  Follow-up Information   Follow up with Eileen Stanford, PA-C On 01/22/2014. (_0  am)    Specialty:  Cardiology   Contact information:   [place] [address] [phone]         Duration of Discharge Encounter: Greater than 30 minutes including physician and PA time.  SignedAngelena Form R PA-C 01/15/2014, 12:04 PM   I have examined the patient and reviewed assessment and plan and discussed with patient. Agree with above as stated. Stressed importance of DAPT. Right groin stable. Palpable pedal pulse. Patient with other graft disease that will be managed medically.  Zarion Oliff S.

## 2014-01-15 NOTE — Progress Notes (Signed)
Pt still c/o feeling mildly SOB despite 98% on 2L per [place], denies CP.  Resp even & unlabored.  Given coke to drink as recommended by Brilinta reps, no significant difference.  Lungs clear but diminished in bases, no cough.  Rt groin and Rt brachial dressings D&I.  Reminded pt not to get up w/o assist, voices understanding, oriented x4.

## 2014-01-15 NOTE — Progress Notes (Signed)
Patient Name: [name] Date of Encounter: 01/15/2014     Active Problems:   Angina pectoris    SUBJECTIVE  Feeling well. Ready to go home.   CURRENT MEDS . amLODipine  10 mg Oral Daily  . aspirin EC  81 mg Oral QHS  . azaTHIOprine  50 mg Oral Daily  . carvedilol  12.5 mg Oral BID WC  . furosemide  40 mg Oral Daily  . gabapentin  1,200 mg Oral QHS  . gabapentin  900 mg Oral BH-[id number]  . Influenza vac split quadrivalent PF  0.5 mL Intramuscular Tomorrow-1000  . isosorbide mononitrate  30 mg Oral QHS  . losartan  100 mg Oral Daily  . multivitamin with minerals  1 tablet Oral Daily  . nortriptyline  25 mg Oral QHS  . pantoprazole  40 mg Oral Daily  . pravastatin  40 mg Oral QHS  . ticagrelor  90 mg Oral BID  . vitamin B-12  1,000 mcg Oral Daily    OBJECTIVE  Filed Vitals:   01/14/14 2100 01/14/14 2200 01/14/14 2311 01/15/14 0552  BP: 153/59 152/58 152/67 149/66  Pulse: 64 64 70 62  Temp:   98.1 F (36.7 C) 98.3 F (36.8 C)  TempSrc:   Oral Oral  Resp:   18 20  Height:      Weight:    220 lb 7.4 oz (100 kg)  SpO2: 97% 97% 98% 97%    Intake/Output Summary (Last 24 hours) at 01/15/14 0806 Last data filed at 01/14/14 2311  Gross per 24 hour  Intake    960 ml  Output   1300 ml  Net   -340 ml   Filed Weights   01/14/14 0942 01/15/14 0552  Weight: 220 lb (99.791 kg) 220 lb 7.4 oz (100 kg)    PHYSICAL EXAM  HEENT: normal  Neck: no JVD  Cardiac: normal S1, S2; RRR; 7-3/4 systolic murmur RUSB  Lungs: Bibasilar crackles (chronic), no wheezes  Abd: soft, nontender, no hepatomegaly  Ext: trace bilateral ankle edema  Skin: warm and dry  Neuro: CNs 2-12 intact, no focal abnormalities noted   Accessory Clinical Findings  CBC  Recent Labs  01/15/14 0421  WBC 7.8  HGB 12.1*  HCT 35.5*  MCV 85.3  PLT 193   Basic Metabolic Panel  Recent Labs  01/15/14 0421  NA 146  K 4.2  CL 104  CO2 28  GLUCOSE 128*  BUN 22  CREATININE 1.58*  CALCIUM  9.3   TELE: NSR with some PVCs.    Radiology/Studies   Cardiac Catheterization Procedure Note  Name: TREYVON BLAHUT  MRN: [id number]  DOB: [birth year]  Procedure: Right Heart Cath, Left Heart Cath, Selective Coronary Angiography, SVG and LIMA angiography, PCI with stenting of the SVG to OM.  Indication: 80 yo WM with remote history of CABG in 2001. S/p stenting of SVG to RCA in 2013. Presents with worsening dyspnea on exertion. Myoview study demonstrates inferior wall scar with anterior wall ischemia.  Procedural Details: The right groin repped, draped, and anesthetized with 1% lidocaine. Using the modified Seldinger technique a 5 Fr sheath was placed in the right femoral artery and a 7 French sheath was placed in the right femoral vein. A Swan-Ganz catheter was used for the right heart catheterization. Standard protocol was followed for recording of right heart pressures and sampling of oxygen saturations. Fick cardiac output was calculated. Standard Judkins catheters were used for selective coronary angiography and left ventriculography.  There were no immediate procedural complications.  Procedural Findings:  Hemodynamics  RA 11/10 mean 8 mm Hg  RV 35/11 mm Hg  PA 36/9 mean 21 mm Hg  PCWP 14/13 mean 11 mm Hg  LV 146/14 mm Hg  AO 147/63 mean 94 mm Hg.  No significant AV or MV gradient.  Oxygen saturations:  PA 72%  AO 98%  Cardiac Output (Fick) 6.7 L/min  Cardiac Index (Fick) 3.0 L/min/meter squared.  Coronary angiography:  Coronary dominance: right  Left mainstem: 80% distal left main stenosis.  Left anterior descending (LAD): 100% occluded at the ostium.  Left circumflex (LCx): The LCx has diffuse 40% disease in the mid vessel. It gives rise to 4 OM branches. The first OM is small with an 80% ostial stenosis. The second OM is occluded. The third OM is small with a 95% proxmal stenosis. The fourth OM is without obstructive disease.  Right coronary artery (RCA): The RCA is occluded  proximally. It fills by various collaterals from the IMA, LAD, and OM.  The SVG to the RCA is occluded proximally.  The SVG to the diagonal is occluded proximally.  The SVG to the OM2 is patent with an eccentric 80% stenosis in the proximal SVG  The LIMA to the LAD is widely patent.  Left ventriculography: Not done.  Following the diagnostic procedure we decided to proceed with PCI of the SVG to OM2. The femoral sheath was exchanged for a 6 Fr sheath. Brilinta 180 mg was given orally. Weight based Angiomax was given for anticoagulation. Once a therapeutic ACT was obtained a 6 Fr. LA1 guide was inserted. The lesion in the SVG was crossed with a filter wire. The filter was deployed in the mid SVG. The lesion was then primarily stented with a 3.5 x 16 mm Promus stent deploying up to 14 atm. Following PCI there was an excellent angiographic results with 0% residual stenosis and TIMI 3 flow. The filter was retrieved without difficulty. The patient was transferred to the recovery area for further monitoring. The femoral sheaths will be removed later with manual compression.  Vessel: SVG to OM2: proximal body  TIMI flow (pre) 3  Stenosis: 80%  Stent: 3.5 x 16 mm Promus  TIMI flow (post): 3  Stenosis (post) 0%  Final Conclusions:  1. Severe 3 vessel obstructive CAD  2. Patent LIMA to the LAD  3. Patent SVG to the OM2 but with moderate-severe stenosis in proximal body of SVG  4. Occluded SVG to the RCA-new  5. Occluded SVG to the diagonal-new  6. Normal right heart and left ventricular filling pressures.  Recommendations: DAPT for at least one year. Continue aggressive medical therapy for residual disease    Nuclear stress test: 01/08/14  Overall Impression: High risk stress nuclear study with two perfusion defects: 1. Medium size severe severity ischemia in the mid LAD territory (SDS 8) and 2. Large scar in the entire inferior, and basal and mid inferolateral walls. . LV Ejection Fraction: 45%. LV  Wall Motion: Paradoxical septal motion and akinesis of the basal and mid inferior and inferolateral walls.    ASSESSMENT AND PLAN [name] is a 80 y.o. male with a hx of CAD: s/p CABG (2001): s/p DES to RCA and cutting POBA to ostial PDA (2013),  interstitial lung disease, mild aortic stenosis, HTN, HL, DM2, CKD, rheumatoid arthritis and chronic dyspnea who presented to [facility] on 01/14/14 for planned LHC after an abnormal Lexiscan myoview.   CAD: Patient has  had chronic chest pain. He is followed by pulmonary for dyspnea and patient wanted Cardiology eval prior to proceeding with CPX test. He underwent Lexiscan myoview on 01/08/14 which returned as high risk with medium size severe ischemia in mLAD territory and large scar in entire inferior, basal and mid- inferolat walls. EF 45%. He was then referred for cardiac cath.  -- He underwent coronary angiography on 01/14/14 which revealed   1. Severe 3 vessel obstructive CAD   2. Patent LIMA to the LAD   3. Patent SVG to the OM2 but with moderate-severe stenosis in proximal body of SVG   4. Occluded SVG to the RCA-new   5. Occluded SVG to the diagonal-new    6. Normal right heart and left ventricular filling pressures.  -- Now s/p successful placement of DES to SVG to OM2 -- Continue aspirin/Brilinta for at least 1 year,  Coreg 12.5 mg BID and pravastatin.   Diastolic CHF: Volume appears stable.  --Echo (7/15): Mild LVH, EF 60-65%, normal wall motion, grade 1 diastolic dysfunction, mild aortic stenosis (mean 14 mm Hg), normal RV function -- Continue Lasix 29m po qd.  Aortic stenosis: Mild by recent echocardiogram.   HTN: Controlled.  -- Continue amlodipine 155m Coreg 12.5 mg BID, losartan 10087mo qd.   Hyperlipidemia: Continue statin.   Interstitial lung disease: Follow up with pulmonology.   Chronic kidney disease: Creatinine stable after cath at 1.58, which is down from 1.9 on 01/10/14  Diabetes mellitus: Fair control. Last A1c 7.6.  Hold metformin at least 48 hours post cardiac cath.     Signed, TEileen Stanford-C  Pager [phone] have examined the patient and reviewed assessment and plan and discussed with patient.  Agree with above as stated.  Stressed importance of DAPT.  Right groin stable.  Palpable pedal pulse.  Tiffanny Lamarche S.

## 2014-01-15 NOTE — Telephone Encounter (Signed)
New message    TCM appt with Nell Range PA. On  10/20 @ 10 am

## 2014-01-16 NOTE — Telephone Encounter (Signed)
Spoke with patient who states he is doing [place] since hospital discharge. Is a little SOB, most likely from the [place]. Reminded of his appointment on 01/22/2014 with K. Grandville Silos. Advised to call us with any problems.

## 2014-01-22 ENCOUNTER — Ambulatory Visit (INDEPENDENT_AMBULATORY_CARE_PROVIDER_SITE_OTHER): Payer: Medicare Other | Admitting: Physician Assistant

## 2014-01-22 ENCOUNTER — Encounter: Payer: Self-pay | Admitting: Physician Assistant

## 2014-01-22 VITALS — BP 110/60 | HR 74 | Ht 73.0 in | Wt 215.0 lb

## 2014-01-22 DIAGNOSIS — R0602 Shortness of breath: Secondary | ICD-10-CM

## 2014-01-22 DIAGNOSIS — N183 Chronic kidney disease, stage 3 (moderate): Secondary | ICD-10-CM

## 2014-01-22 LAB — BASIC METABOLIC PANEL
BUN: 43 mg/dL — ABNORMAL HIGH (ref 6–23)
CALCIUM: 10 mg/dL (ref 8.4–10.5)
CO2: 28 mEq/L (ref 19–32)
Chloride: 97 mEq/L (ref 96–112)
Creatinine, Ser: 2.3 mg/dL — ABNORMAL HIGH (ref 0.4–1.5)
GFR: 29.64 mL/min — AB (ref >60.00)
Glucose, Bld: 229 mg/dL — ABNORMAL HIGH (ref 70–99)
POTASSIUM: 3.8 meq/L (ref 3.5–5.1)
Sodium: 138 mEq/L (ref 135–145)

## 2014-01-22 LAB — BRAIN NATRIURETIC PEPTIDE: PRO B NATRI PEPTIDE: 37 pg/mL (ref 0.0–100.0)

## 2014-01-22 MED ORDER — CLOPIDOGREL BISULFATE 75 MG PO TABS: 75.0000 mg | ORAL_TABLET | Freq: Every day | ORAL | Status: DC

## 2014-01-22 MED ORDER — PANTOPRAZOLE SODIUM 20 MG PO TBEC: 20.0000 mg | DELAYED_RELEASE_TABLET | Freq: Every day | ORAL | Status: DC

## 2014-01-22 NOTE — Progress Notes (Addendum)
Cardiology Office Note    Date:  01/22/2014   ID:  Mykel, Sponaugle [birth year], MRN [id number]  PCP:  Viviana Simpler, MD  Cardiologist:  Dr. Johnsie Cancel    History of Present Illness: [name] is a 80 y.o. male with a hx of CAD: s/p CABG (2001): s/p DES to RCA and cutting POBA to ostial PDA (2013), interstitial lung disease, mild aortic stenosis, HTN, HL, DM2, CKD, rheumatoid arthritis and chronic dyspnea who was recently admitted to [facility] on 01/14/14 for planned LHC after an abnormal Lexiscan myoview. He underwent successful placement of DES to SVG to OM2. Today he presents for post hospital follow up.  He reports worsening SOB since placement of his DES to SVG to OM2 on 01/14/14 and being placed on Brilinta. However, he has chronic dyspnea. No chest pain, orthopnea, PND or LE edema. No lightheadedness or syncope. Some mild orthostatic hypotension. No blood in his stool or urine.   Studies:  - LHC (01/14/14):  Svr 3V CAD, patent LIMA to LAD, patent SVG to OM2 but mod-sev sten of prox SVG, new occluded SVG to Diag, new occluded SVG to RCA. S/p DES to SVG to OM2  - Echo (10/2013): Mild LVH, EF 60-65%, normal wall motion, grade 1 diastolic dysfunction, mild aortic stenosis (mean 14 mm Hg), normal RV function   - Nuclear (01/08/14): High risk stress nuclear study with two perfusion defects: 1. Medium size severe severity ischemia in the mid LAD territory (SDS 8) and 2. Large scar in the entire inferior, and basal and mid inferolateral walls. .  LV Ejection Fraction: 45%. LV Wall Motion: Paradoxical septal motion and akinesis of the basal and mid inferior and inferolateral walls.   Recent Labs/Images:   Recent Labs  02/19/13 1038  02/25/13 0550  01/02/14 1104  01/15/14 0421  NA 142  < >  --   < >  --   < > 146  K 4.3  < >  --   < >  --   < > 4.2  BUN 20  < >  --   < >  --   < > 22  CREATININE 1.40*  < >  --   < >  --   < > 1.58*  ALT 12  --   --   --   --   --   --   HGB 12.3*   < >  --   --   --   < > 12.1*  LDLCALC  --   --  76  --   --   --   --   HDL  --   --  30*  --   --   --   --   PROBNP  --   < >  --   --  43.0  --   --   < > = values in this interval not displayed.   No results found.   Wt Readings from Last 3 Encounters:  01/22/14 215 lb (97.523 kg)  01/15/14 220 lb 7.4 oz (100 kg)  01/15/14 220 lb 7.4 oz (100 kg)     Past Medical History  Diagnosis Date  . Hyperlipidemia   . Hypertension   . GERD (gastroesophageal reflux disease)   . Overweight (BMI 25.0-29.9)     BMI 29  . Interstitial lung disease     NOS  . Diverticulosis   . Esophageal stricture     a. s/p dilation  spring 2010  . CAD (coronary artery disease)     a. s/p CABG (2001)  b. s/p DES to RCA and cutting POBA to ostial PDA (2013)   c. s/p DES to SVG to OM2 (01/14/14)  . History of PFTs     mixed pattern on spiro. mild restn on lung volumes with near normal DLCO. Pattern can be explained by CABG scar. Fev1 2.2L/73%, ratio 68 (67), TLC 4.7/68%,RV 1.5L/55%,DLCO 79%  . Colon polyps   . Chronic kidney disease, stage III (moderate)   . Chronic diastolic CHF (congestive heart failure)     a) 09/13 ECHO- LVEF 67-12%, grade 1 diastolic dysfunction, mild LA dilatation, atrial septal aneurysm, AV mobility restricted, but no sig AS by doppler; b) 09/04/08 ECHO- LVH, ef 60%, mild AS,  . Heart murmur   . Chronic lower back pain   . Dyspnea 2009 since July -Sept    05/06/08-CPST-  normal effort, reduced VO2 max 20.5 /65%, reduced at 8.2/ 40%, normal breathing resetvca of 55%, submaximal heart rate response 112/77%, flattened o2 pluse response at peak exercise-12 ml/beat @ 85%, No VQ mismatch abnormalities, All c/w CIRC Limitation  . Hiatal hernia   . Type II diabetes mellitus   . Arthritis     osteoarthritis, s/p R TKR, and digits  . RA (rheumatoid arthritis)     [name]  . Seropositive rheumatoid arthritis   . Enlarged prostate     Current Outpatient Prescriptions  Medication Sig  Dispense Refill  . amLODipine (NORVASC) 10 MG tablet Take 10 mg by mouth daily.      [name] aspirin EC 81 MG tablet Take 81 mg by mouth at bedtime.      [name] azaTHIOprine (IMURAN) 50 MG tablet Take 50 mg by mouth daily.       . Blood Glucose Monitoring Suppl (ONE TOUCH BASIC SYSTEM) W/DEVICE KIT Used to test blood sugar once daily dx: 250.60  1 each  0  . carvedilol (COREG) 12.5 MG tablet Take 12.5 mg by mouth 2 (two) times daily with a meal.      . DHA-Vitamin C-Lutein (EYE HEALTH FORMULA PO) Take 1 tablet by mouth daily.       . diclofenac sodium (VOLTAREN) 1 % GEL Apply 1 application topically daily. Apply to fingers and toes      . esomeprazole (NEXIUM) 20 MG capsule Take 20 mg by mouth daily.       . furosemide (LASIX) 40 MG tablet Take 40 mg by mouth daily.      [name] gabapentin (NEURONTIN) 300 MG capsule Take 900-1,200 mg by mouth 2 (two) times daily. Take 3 capsules (900 mg) every morning and 4 capsules (1200 mg) every night      . glucose blood (ONE TOUCH TEST STRIPS) test strip Used to test blood sugar once daily dx: 250.60  100 each  1  . isosorbide mononitrate (IMDUR) 30 MG 24 hr tablet Take 30 mg by mouth at bedtime.      [name] losartan (COZAAR) 100 MG tablet Take 100 mg by mouth daily.      . metFORMIN (GLUCOPHAGE) 1000 MG tablet Take 1 tablet (1,000 mg total) by mouth 2 (two) times daily with a meal.  60 tablet  11  . Multiple Vitamin (MULTIVITAMIN WITH MINERALS) TABS tablet Take 1 tablet by mouth daily.      . nitroGLYCERIN (NITROSTAT) 0.4 MG SL tablet Place 0.4 mg under the tongue every 5 (five) minutes as needed for chest pain.  Call 911 if not resolved after 3 doses      . nortriptyline (PAMELOR) 25 MG capsule Take 25 mg by mouth at bedtime.      . ONE TOUCH LANCETS MISC Used to test blood sugar once daily dx: 250.60  100 each  1  . pravastatin (PRAVACHOL) 40 MG tablet Take 40 mg by mouth at bedtime.      . ticagrelor (BRILINTA) 90 MG TABS tablet Take 1 tablet (90 mg total) by mouth 2 (two)  times daily.  60 tablet  11  . vitamin B-12 (CYANOCOBALAMIN) 1000 MCG tablet Take 1,000 mcg by mouth daily.       No current facility-administered medications for this visit.     Allergies:   Doxazosin mesylate and Methocarbamol   Social History:  The patient  reports that he quit smoking about 50 years ago. His smoking use included Cigarettes. He has a 20 pack-year smoking history. He has never used smokeless tobacco. He reports that he does not drink alcohol or use illicit drugs.   Family History:  The patient's family history includes Alcohol abuse in his sister; COPD in his mother; Colon cancer in his brother; Diabetes in his brother; Heart attack in his father; Heart disease in his father; Stroke in his sister.   ROS:  Please see the history of present illness.  All other systems reviewed and negative.    PHYSICAL EXAM: VS:  BP 110/60  Pulse 74  Ht $R'6\' 1"'LF$  (1.854 m)  Wt 215 lb (97.523 kg)  BMI 28.37 kg/m2 Well nourished, well developed, in no acute distress HEENT: normal Neck: no JVD Cardiac:  normal S1, S2; RRR; +murmur Lungs:  clear to auscultation bilaterally, no wheezing, rhonchi or rales Abd: soft, nontender, no hepatomegaly Ext: no edema. Bruising around left groin site. No pulsatile mass. Good LE pulse. Skin: warm and dry Neuro:  CNs 2-12 intact, no focal abnormalities noted  EKG:  NSR with some new TWI in III and AVF similar to a previous tracing on 12/2013   ASSESSMENT AND PLAN:  JAHSHUA BONITO is a 80 y.o. male with a hx of CAD: s/p CABG (2001): s/p DES to RCA and cutting POBA to ostial PDA (2013), interstitial lung disease, mild aortic stenosis, HTN, HL, DM2, CKD, rheumatoid arthritis and chronic dyspnea who was recently admitted to [facility] on 01/14/14 for planned LHC after an abnormal Lexiscan myoview. He underwent successful placement of DES to SVG to OM2. Today he presents for post hospital follow up.  CAD/Unstable angina: He underwent Lexiscan myoview on 01/08/14  which returned as high risk with medium size severe ischemia in mLAD territory and large scar in entire inferior, basal and mid- inferolat walls. EF 45%. He was then referred for cardiac cath.  -- He underwent coronary angiography on 01/14/14 which revealed  1. Severe 3 vessel obstructive CAD  2. Patent LIMA to the LAD  3. Patent SVG to the OM2 but with moderate-severe stenosis in proximal body of SVG  4. Occluded SVG to the RCA-new  5. Occluded SVG to the diagonal-new  6. Normal right heart and left ventricular filling pressures.  -- Now s/p successful placement of DES to SVG to OM2  -- Continue DAPT for at least 1 year, Coreg 12.5 mg BID and pravastatin.  -- He was placed on Brilinta on 01/14/14 but is experiencing SOB associated with this medication. He also cannot afford the co-pays. For these reasons we will switch him to Plavix after he has  completed 1 month of Brilinta. He thinks he can manage the SOB until then. He also knows that the SOB may get better with time on the Brilinta. He will be finished with 1 month on 01/14/14. -- Groin site stable with bruising and tenderness but no pulsatile mass.   GERD- will change his Nexium to Protonix as he will be starting Plavix on 02/14/14.  Diastolic CHF: Volume appears stable.  --Echo (10/2013): Mild LVH, EF 60-65%, normal wall motion, grade 1 diastolic dysfunction, mild aortic stenosis (mean 14 mm Hg), normal RV function  -- Continue Lasix 8m po qd.  -- Will check a BNP today. If elevated can give him a few extra doses of Lasix.  Aortic stenosis: Mild by recent echocardiogram.   HTN: Well controlled today at 110/60 -- Continue amlodipine 188m Coreg 12.5 mg BID, losartan 10071mo qd.   Hyperlipidemia: Continue statin.   Interstitial lung disease: Follow up with pulmonology.   Chronic kidney disease: Creatinine stable after cath at 1.58, which is down from 1.9 on 01/10/14  -- Will check a BMET today    Disposition: FU with Dr. NisJohnsie Canceln 3-4 weeks.   Signed, KatVesta MixerA-C, MHS 01/22/2014 10:33 AM    [facility] [id number]  [phone]: [phone]: [phone]

## 2014-01-22 NOTE — Patient Instructions (Addendum)
Your physician has recommended you make the following change in your medication:    STOP BIRLINTA  ON 02/14/14  STOP TAKING NEXIUM   START TAKING PROTONIX 20 MG ONCE A DAY   START TAKING PLAVIX 56 MG ONCE  DAY      Your physician recommends that you return for lab work  [place] BNP   Your physician recommends that you schedule a follow-up appointment in:  3 TO [place] Johnsie Cancel

## 2014-01-23 ENCOUNTER — Telehealth: Payer: Self-pay | Admitting: *Deleted

## 2014-01-23 DIAGNOSIS — I5032 Chronic diastolic (congestive) heart failure: Secondary | ICD-10-CM

## 2014-01-23 MED ORDER — FUROSEMIDE 40 MG PO TABS: 20.0000 mg | ORAL_TABLET | Freq: Every day | ORAL | Status: DC

## 2014-01-23 MED ORDER — METFORMIN HCL 1000 MG PO TABS: 1000.0000 mg | ORAL_TABLET | Freq: Two times a day (BID) | ORAL | Status: DC

## 2014-01-23 NOTE — Telephone Encounter (Signed)
pt notified about lab results and med changes, hold metformin, losartan, lasix. Resume losartan after holding x 2 days; resume lasix at 20 mg daily after holding x 2 days. BMET 10/26. Pt verbalized understanding to instructions.

## 2014-01-28 ENCOUNTER — Other Ambulatory Visit (INDEPENDENT_AMBULATORY_CARE_PROVIDER_SITE_OTHER): Payer: Medicare Other | Admitting: *Deleted

## 2014-01-28 DIAGNOSIS — I5032 Chronic diastolic (congestive) heart failure: Secondary | ICD-10-CM

## 2014-01-28 LAB — BASIC METABOLIC PANEL
BUN: 23 mg/dL (ref 6–23)
CALCIUM: 9.2 mg/dL (ref 8.4–10.5)
CHLORIDE: 102 meq/L (ref 96–112)
CO2: 29 meq/L (ref 19–32)
CREATININE: 1.7 mg/dL — AB (ref 0.4–1.5)
GFR: 41.1 mL/min — ABNORMAL LOW (ref >60.00)
GLUCOSE: 205 mg/dL — AB (ref 70–99)
Potassium: 3.8 mEq/L (ref 3.5–5.1)
Sodium: 138 mEq/L (ref 135–145)

## 2014-01-29 ENCOUNTER — Telehealth: Payer: Self-pay | Admitting: *Deleted

## 2014-01-29 NOTE — Telephone Encounter (Signed)
pt notified about lab results and to f/u w/PCP in regards to if he should resume/remain on metformin. Pt c/o today that the Brilinta is still making him sob. I advised I will d/w Brynda Rim. PA and cb with recommendations, pt said ok and thank you

## 2014-01-30 ENCOUNTER — Telehealth: Payer: Self-pay

## 2014-01-30 NOTE — Telephone Encounter (Signed)
Left message for [name] that Dr. Silvio Pate is out of the office until Monday afternoon and per Dr. Glori Bickers he should not restart his Metformin until we hear back from Dr. Silvio Pate.

## 2014-01-30 NOTE — Telephone Encounter (Signed)
Pt left v/m; pt had stent put in 2 weeks ago; pt was advised to stop metformin and to call PCP to find out when or if pt should  restart metformin. Pt request cb.

## 2014-01-30 NOTE — Telephone Encounter (Signed)
Given his kidney status -I do not know if Dr Silvio Pate will want him back on it or not --he is out of the office  I will route to him -let pt know to hold off on re start for now

## 2014-01-31 ENCOUNTER — Telehealth: Payer: Self-pay | Admitting: Physician Assistant

## 2014-01-31 NOTE — Telephone Encounter (Signed)
lmtcb/lr

## 2014-01-31 NOTE — Telephone Encounter (Signed)
Patient advised that [name] called him on Tuesday, but that Dr. Alla German office called him yesterday about his Metformin. He also needs a followup appointment with Dr. Audelia Acton or PA, status post stent placement 2 weeks ago.

## 2014-01-31 NOTE — Telephone Encounter (Signed)
New problem   Pt returning call from nurse.

## 2014-01-31 NOTE — Telephone Encounter (Signed)
Left message for patient to call back. I need to speak with him regarding his medications (Brilinta). Richardson Dopp, PA-C   01/31/2014 4:31 PM

## 2014-01-31 NOTE — Telephone Encounter (Signed)
Follow up     Please call him back on the house phone

## 2014-02-02 ENCOUNTER — Other Ambulatory Visit: Payer: Self-pay | Admitting: Cardiology

## 2014-02-02 NOTE — Telephone Encounter (Signed)
Please call him His repeat kidney test looked okay Have him restart his metformin

## 2014-02-04 ENCOUNTER — Telehealth: Payer: Self-pay | Admitting: Physician Assistant

## 2014-02-04 LAB — HM DIABETES EYE EXAM

## 2014-02-04 NOTE — Telephone Encounter (Signed)
Spoke with patient and advised results   

## 2014-02-04 NOTE — Telephone Encounter (Signed)
Needs to speak with Richardson Dopp PA. Please call him after 3:30 today.

## 2014-02-04 NOTE — Telephone Encounter (Signed)
New message      Returning a nurse's call from last week

## 2014-02-05 ENCOUNTER — Ambulatory Visit: Payer: Medicare Other | Admitting: Physician Assistant

## 2014-02-05 NOTE — Telephone Encounter (Signed)
Patient notified us that he was still having difficulty with dyspnea. I spoke to him by telephone. He believes that he can continue Brilinta to complete one month of therapy post PCI. I have reviewed his case with Dr. Martinique who did his PCI. He felt that the patient would need to be loaded with Plavix should he switch from Brilinta to Plavix. He will need to take 600 mg on day 1 and 75 mg daily thereafter. I reviewed this with the patient today. He verbalized understanding. He will notify us if he opts to start Plavix sooner. He will keep follow-up with Dr. Johnsie Cancel as planned. Richardson Dopp, PA-C   02/05/2014 5:34 PM

## 2014-02-14 ENCOUNTER — Encounter: Payer: Self-pay | Admitting: Internal Medicine

## 2014-02-19 ENCOUNTER — Encounter: Payer: Self-pay | Admitting: Cardiovascular Disease

## 2014-02-19 ENCOUNTER — Ambulatory Visit (INDEPENDENT_AMBULATORY_CARE_PROVIDER_SITE_OTHER): Payer: Medicare Other | Admitting: Cardiovascular Disease

## 2014-02-19 VITALS — BP 120/72 | HR 69 | Ht 73.0 in | Wt 225.0 lb

## 2014-02-19 DIAGNOSIS — I2510 Atherosclerotic heart disease of native coronary artery without angina pectoris: Secondary | ICD-10-CM

## 2014-02-19 DIAGNOSIS — I350 Nonrheumatic aortic (valve) stenosis: Secondary | ICD-10-CM

## 2014-02-19 DIAGNOSIS — E119 Type 2 diabetes mellitus without complications: Secondary | ICD-10-CM

## 2014-02-19 DIAGNOSIS — I2583 Coronary atherosclerosis due to lipid rich plaque: Principal | ICD-10-CM

## 2014-02-19 DIAGNOSIS — J849 Interstitial pulmonary disease, unspecified: Secondary | ICD-10-CM

## 2014-02-19 DIAGNOSIS — I10 Essential (primary) hypertension: Secondary | ICD-10-CM

## 2014-02-19 NOTE — Assessment & Plan Note (Signed)
Discussed low carb diet.  Target hemoglobin A1c is 6.5 or less.  Continue current medications.  

## 2014-02-19 NOTE — Assessment & Plan Note (Signed)
Mild no change in murmur  F/U echo in a year or change in symptoms

## 2014-02-19 NOTE — Assessment & Plan Note (Signed)
No angina post PCI DES SVG OM2 01/14/14  Continue plavix indefinitely

## 2014-02-19 NOTE — Assessment & Plan Note (Signed)
Well controlled.  Continue current medications and low sodium Dash type diet.    

## 2014-02-19 NOTE — Assessment & Plan Note (Signed)
Chronic dyspnea was worse on Brillinta now better Abnormal lung exam f/u pulmonary Monday

## 2014-02-19 NOTE — Progress Notes (Signed)
Patient ID: [name], male   DOB: 17-Sep-1931, 80 y.o.   MRN: [id number] [name] is a 80 y.o. male with a hx of CAD: s/p CABG (2001): s/p DES to RCA and cutting POBA to ostial PDA (2013), interstitial lung disease, mild aortic stenosis, HTN, HL, DM2, CKD, rheumatoid arthritis and chronic dyspnea who was recently admitted to [facility] on 01/14/14 for planned LHC after an abnormal Lexiscan myoview. He underwent successful placement of DES to SVG to OM2.  Studies: - LHC (01/14/14): Svr 3V CAD, patent LIMA to LAD, patent SVG to OM2 but mod-sev sten of prox SVG, new occluded SVG to Diag, new occluded SVG to RCA. S/p DES to SVG to OM2  - Echo (10/2013): Mild LVH, EF 60-65%, normal wall motion, grade 1 diastolic dysfunction, mild aortic stenosis (mean 14 mm Hg), normal RV function  - Nuclear (01/08/14): High risk stress nuclear study with two perfusion defects: 1. Medium size severe severity ischemia in the mid LAD territory (SDS 8) and 2. Large scar in the entire inferior, and basal and mid inferolateral walls. .  LV Ejection Fraction   Dyspnea improved off Brillinta and taking plavix now Has f/u with pulmonary Monday      ROS: Denies fever, malais, weight loss, blurry vision, decreased visual acuity, cough, sputum, SOB, hemoptysis, pleuritic pain, palpitaitons, heartburn, abdominal pain, melena, lower extremity edema, claudication, or rash.  All other systems reviewed and negative  General: Affect appropriate Healthy:  appears stated age 12: normal Neck supple with no adenopathy JVP normal no bruits no thyromegaly Lungs decreased BS right base no wheezing and good diaphragmatic motion Heart:  S1/S2 mild AS  murmur, no rub, gallop or click PMI normal Abdomen: benighn, BS positve, no tenderness, no AAA no bruit.  No HSM or HJR Distal pulses intact with no bruits No edema Neuro non-focal Skin warm and dry No muscular weakness   Current Outpatient Prescriptions  Medication Sig  Dispense Refill  . amLODipine (NORVASC) 10 MG tablet Take 10 mg by mouth daily.    [name] aspirin EC 81 MG tablet Take 81 mg by mouth at bedtime.    [name] azaTHIOprine (IMURAN) 50 MG tablet Take 50 mg by mouth daily.     . Blood Glucose Monitoring Suppl ([place]) W/DEVICE KIT Used to test blood sugar once daily dx: 250.60 1 each 0  . carvedilol (COREG) 12.5 MG tablet Take 12.5 mg by mouth 2 (two) times daily with a meal.    . clopidogrel (PLAVIX) 75 MG tablet Take 1 tablet (75 mg total) by mouth daily. 30 tablet 7  . DHA-Vitamin C-Lutein (EYE HEALTH FORMULA PO) Take 1 tablet by mouth daily.     . furosemide (LASIX) 40 MG tablet Take 0.5 tablets (20 mg total) by mouth daily.    [name] gabapentin (NEURONTIN) 300 MG capsule Take 900-1,200 mg by mouth 2 (two) times daily. Take 3 capsules (900 mg) every morning and 4 capsules (1200 mg) every night    . glucose blood (ONE TOUCH TEST STRIPS) test strip Used to test blood sugar once daily dx: 250.60 100 each 1  . isosorbide mononitrate (IMDUR) 30 MG 24 hr tablet TAKE 1 TABLET BY MOUTH DAILY 30 tablet 3  . losartan (COZAAR) 100 MG tablet Take 100 mg by mouth daily.    . metFORMIN (GLUCOPHAGE) 1000 MG tablet Take 1 tablet (1,000 mg total) by mouth 2 (two) times daily with a meal. HOLD PER SCOTT WEAVER, [facility] 01/23/14.[name]    [name]  Multiple Vitamin (MULTIVITAMIN WITH MINERALS) TABS tablet Take 1 tablet by mouth daily.    . nitroGLYCERIN (NITROSTAT) 0.4 MG SL tablet Place 0.4 mg under the tongue every 5 (five) minutes as needed for chest pain. Call 911 if not resolved after 3 doses    . ONE TOUCH LANCETS MISC Used to test blood sugar once daily dx: 250.60 100 each 1  . pravastatin (PRAVACHOL) 40 MG tablet Take 40 mg by mouth at bedtime.    . vitamin B-12 (CYANOCOBALAMIN) 1000 MCG tablet Take 1,000 mcg by mouth daily.     No current facility-administered medications for this visit.    Allergies  Doxazosin mesylate and Methocarbamol  Electrocardiogram:  SR rate  44 old IMI with T wave inversions   Assessment and Plan

## 2014-02-19 NOTE — Patient Instructions (Signed)
Your physician wants you to follow-up in:  6 MONTHS WITH DR NISHAN  You will receive a reminder letter in the mail two months in advance. If you don't receive a letter, please call our office to schedule the follow-up appointment. Your physician recommends that you continue on your current medications as directed. Please refer to the Current Medication list given to you today. 

## 2014-03-04 ENCOUNTER — Telehealth: Payer: Self-pay | Admitting: Internal Medicine

## 2014-03-04 ENCOUNTER — Ambulatory Visit (INDEPENDENT_AMBULATORY_CARE_PROVIDER_SITE_OTHER): Payer: Medicare Other | Admitting: Internal Medicine

## 2014-03-04 ENCOUNTER — Encounter: Payer: Self-pay | Admitting: Internal Medicine

## 2014-03-04 VITALS — BP 146/76 | HR 65 | Ht 73.0 in | Wt 232.0 lb

## 2014-03-04 DIAGNOSIS — I00 Rheumatic fever without heart involvement: Secondary | ICD-10-CM | POA: Insufficient documentation

## 2014-03-04 DIAGNOSIS — J849 Interstitial pulmonary disease, unspecified: Secondary | ICD-10-CM

## 2014-03-04 DIAGNOSIS — M0520 Rheumatoid vasculitis with rheumatoid arthritis of unspecified site: Secondary | ICD-10-CM | POA: Insufficient documentation

## 2014-03-04 DIAGNOSIS — R0600 Dyspnea, unspecified: Secondary | ICD-10-CM
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
## 2014-03-04 NOTE — Patient Instructions (Addendum)
ICD-9-CM ICD-10-CM   1. Dyspnea 786.09 R06.00   2. ILD (interstitial lung disease) 515 J84.9   3. Rheumatoid arteritis 447.8 I00     Too bad your shortness of breath is still a problem despite stent placement  PLAN I will talk to DR Johnsie Cancel and once he gives approval we will get this test done Otherwise followup in  23months

## 2014-03-04 NOTE — Telephone Encounter (Signed)
Ok to repeat CPST
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
## 2014-03-05 ENCOUNTER — Other Ambulatory Visit: Payer: Self-pay | Admitting: Internal Medicine

## 2014-03-05 ENCOUNTER — Other Ambulatory Visit: Payer: Self-pay | Admitting: Cardiovascular Disease

## 2014-03-09 NOTE — Telephone Encounter (Signed)
Daneil Dan  Let patient know that Dr Johnsie Cancel is ok with him getting CPST. However, no technician til Jan-March 2016. If patient interested, I can refer him to [facility] or [place] or [facility] just for CPST  Thanks  Dr. Brand Males, M.D., [facility].C.P Pulmonary and Critical Care Medicine Staff Physician [facility] Pager: [phone], If no answer or between  15:00h - 7:00h: call [phone]  03/09/2014 6:13 AM

## 2014-03-12 ENCOUNTER — Ambulatory Visit (INDEPENDENT_AMBULATORY_CARE_PROVIDER_SITE_OTHER): Payer: Medicare Other | Admitting: Internal Medicine

## 2014-03-12 ENCOUNTER — Encounter: Payer: Self-pay | Admitting: Internal Medicine

## 2014-03-12 VITALS — BP 148/60 | HR 60 | Temp 98.5°F | Wt 229.0 lb

## 2014-03-12 DIAGNOSIS — I5032 Chronic diastolic (congestive) heart failure: Secondary | ICD-10-CM

## 2014-03-12 DIAGNOSIS — M059 Rheumatoid arthritis with rheumatoid factor, unspecified: Secondary | ICD-10-CM

## 2014-03-12 DIAGNOSIS — E1142 Type 2 diabetes mellitus with diabetic polyneuropathy: Secondary | ICD-10-CM

## 2014-03-12 DIAGNOSIS — E785 Hyperlipidemia, unspecified: Secondary | ICD-10-CM

## 2014-03-12 DIAGNOSIS — E1149 Type 2 diabetes mellitus with other diabetic neurological complication: Secondary | ICD-10-CM

## 2014-03-12 DIAGNOSIS — I25118 Atherosclerotic heart disease of native coronary artery with other forms of angina pectoris: Secondary | ICD-10-CM

## 2014-03-12 LAB — HEMOGLOBIN A1C: HEMOGLOBIN A1C: 7.4 % — AB (ref 4.6–6.5)

## 2014-03-12 LAB — HM DIABETES FOOT EXAM

## 2014-03-12 LAB — LIPID PANEL
CHOL/HDL RATIO: 5
Cholesterol: 171 mg/dL (ref 0–200)
HDL: 34.3 mg/dL — AB (ref >39.00)
LDL Cholesterol: 103 mg/dL — ABNORMAL HIGH (ref 0–99)
NonHDL: 136.7
Triglycerides: 171 mg/dL — ABNORMAL HIGH (ref 0.0–149.0)
VLDL: 34.2 mg/dL (ref 0.0–40.0)

## 2014-03-12 NOTE — Assessment & Plan Note (Signed)
No problems with statin Due for labs 

## 2014-03-12 NOTE — Progress Notes (Signed)
Pre visit review using our clinic review tool, if applicable. No additional management support is needed unless otherwise documented below in the visit note. 

## 2014-03-12 NOTE — Assessment & Plan Note (Signed)
Recent stent and ongoing problems since then with pain and DOE Awaiting further testing

## 2014-03-12 NOTE — Assessment & Plan Note (Signed)
Ongoing pain in feet despite the gabapentin No changes for now

## 2014-03-12 NOTE — Assessment & Plan Note (Signed)
Neutral fluid status No changes needed 

## 2014-03-12 NOTE — Assessment & Plan Note (Signed)
Pain is much better on his Rx [name] monitors

## 2014-03-12 NOTE — Progress Notes (Signed)
Subjective:    Patient ID: [name], male    DOB: [birth year], 80 y.o.   MRN: [id number]  HPI Here for follow up of diabetes and multiple chronic medical problems Reviewed his multiple medical visits and conditions  Recent coronary stent--in October Was on Brilinta and it caused SOB On for 1 month--then off Breathing is still not back to normal Saw [name] to do further testing (cardiopulmonary stress?) Gets chest pain with short walk--DOE also May be worse then before the stent  Checks sugars regularly 150's fasting No hypoglycemic reactions Still has pain in his feet---not sure the gabapentin is helping. 7 per day No sores  Recent repeat renal function Now back to previous level (?related to dye from cath) No uremic symptoms  Arthritis is better [name] keeps on eye on this  Current Outpatient Prescriptions on File Prior to Visit  Medication Sig Dispense Refill  . amLODipine (NORVASC) 10 MG tablet Take 10 mg by mouth daily.    [name] aspirin EC 81 MG tablet Take 81 mg by mouth at bedtime.    [name] azaTHIOprine (IMURAN) 50 MG tablet Take 50 mg by mouth daily.     . carvedilol (COREG) 12.5 MG tablet TAKE 1 TABLET BY MOUTH TWICE A DAY WITH MEALS 60 tablet 5  . clopidogrel (PLAVIX) 75 MG tablet Take 1 tablet (75 mg total) by mouth daily. 30 tablet 7  . DHA-Vitamin C-Lutein (EYE HEALTH FORMULA PO) Take 1 tablet by mouth daily.     . furosemide (LASIX) 40 MG tablet Take 0.5 tablets (20 mg total) by mouth daily.    [name] gabapentin (NEURONTIN) 300 MG capsule TAKE 3 CAPSULES BY MOUTH EACH MORNING. AND TAKE 4-5 CAPSULES AT BEDTIME 240 capsule 3  . glucose blood (ONE TOUCH TEST STRIPS) test strip Used to test blood sugar once daily dx: 250.60 100 each 1  . isosorbide mononitrate (IMDUR) 30 MG 24 hr tablet TAKE 1 TABLET BY MOUTH DAILY 30 tablet 3  . losartan (COZAAR) 100 MG tablet Take 100 mg by mouth daily.    . Multiple Vitamin (MULTIVITAMIN WITH MINERALS) TABS  tablet Take 1 tablet by mouth daily.    . nitroGLYCERIN (NITROSTAT) 0.4 MG SL tablet Place 0.4 mg under the tongue every 5 (five) minutes as needed for chest pain. Call 911 if not resolved after 3 doses    . ONE TOUCH LANCETS MISC Used to test blood sugar once daily dx: 250.60 100 each 1  . pravastatin (PRAVACHOL) 40 MG tablet Take 40 mg by mouth at bedtime.    . vitamin B-12 (CYANOCOBALAMIN) 1000 MCG tablet Take 1,000 mcg by mouth daily.     No current facility-administered medications on file prior to visit.    Allergies  Allergen Reactions  . Doxazosin Mesylate Other (See Comments)    dizziness  . Methocarbamol Rash    Past Medical History  Diagnosis Date  . Hyperlipidemia   . Hypertension   . GERD (gastroesophageal reflux disease)   . Overweight (BMI 25.0-29.9)     BMI 29  . Interstitial lung disease     NOS  . Diverticulosis   . Esophageal stricture     a. s/p dilation spring 2010  . CAD (coronary artery disease)     a. s/p CABG (2001)  b. s/p DES to RCA and cutting POBA to ostial PDA (2013)   c. s/p DES to SVG to OM2 (01/14/14)  . History of PFTs     mixed  pattern on spiro. mild restn on lung volumes with near normal DLCO. Pattern can be explained by CABG scar. Fev1 2.2L/73%, ratio 68 (67), TLC 4.7/68%,RV 1.5L/55%,DLCO 79%  . Colon polyps   . Chronic kidney disease, stage III (moderate)   . Chronic diastolic CHF (congestive heart failure)     a) 09/13 ECHO- LVEF 99991111, grade 1 diastolic dysfunction, mild LA dilatation, atrial septal aneurysm, AV mobility restricted, but no sig AS by doppler; b) 09/04/08 ECHO- LVH, ef 60%, mild AS,  . Heart murmur   . Chronic lower back pain   . Dyspnea 2009 since July -Sept    05/06/08-CPST-  normal effort, reduced VO2 max 20.5 /65%, reduced at 8.2/ 40%, normal breathing resetvca of 55%, submaximal heart rate response 112/77%, flattened o2 pluse response at peak exercise-12 ml/beat @ 85%, No VQ mismatch abnormalities, All c/w CIRC Limitation   . Hiatal hernia   . Type II diabetes mellitus   . Arthritis     osteoarthritis, s/p R TKR, and digits  . RA (rheumatoid arthritis)     [name]  . Seropositive rheumatoid arthritis   . Enlarged prostate     Past Surgical History  Procedure Laterality Date  . Total knee arthroplasty Right 03/2010    [name]  . Knee arthroscopy Right 2008  . Cataract extraction w/ intraocular lens  implant, bilateral Bilateral   . Coronary artery bypass graft  11/1999    CABG X5  . Coronary stent placement  02/2012    1 stent and balloon  . Shoulder arthroscopy with open rotator cuff repair and distal clavicle acrominectomy Left 02/27/2013    Procedure: LEFT SHOULDER ARTHROSCOPY WITH MINI OPEN ROTATOR CUFF REPAIR AND SUBACROMIAL DECOMPRESSION AND DISTAL CLAVICLE RESECTION;  Surgeon: [name], MD;  Location: [facility];  Service: Orthopedics;  Laterality: Left;  . Trigger finger release Left 02/27/2013    Procedure: RELEASE TRIGGER FINGER/A-1 PULLEY;  Surgeon: [name], MD;  Location: [place];  Service: Orthopedics;  Laterality: Left;  . Cholecystectomy open  11/2003    [name]  . Joint replacement    . Esophagogastroduodenoscopy (egd) with esophageal dilation  2010  . Cardiac catheterization  08/2004    CP- no MI, Cath- small vessell disease   . Cardiac catheterization  12/31/2011    80% distal LM, 100% native LAD, LCx and RCA, 30% prox SVG-OM, SVG-D1 normal, 99% distal, 80% ostial SVG-RCA distal to graft, LIMA-LAD normal; LVEF mildly decreased with posterior basal AK   . Coronary angioplasty with stent placement  01/03/2012    Successful DES to SVG-RCA and cutting balloon angioplasty ostial  PDA   . Coronary angioplasty with stent placement  01/14/2014    "1"  . Cardiac catheterization  2009    with patent grafts/notes 12/31/2011    Family History  Problem Relation Age of Onset  . COPD Mother   . Heart disease Father   . Diabetes Brother   . Colon cancer Brother   . Alcohol  abuse Sister   . Heart attack Father   . Stroke Sister     History   Social History  . Marital Status: Married    Spouse Name: N/A    Number of Children: N/A  . Years of Education: N/A   Occupational History  . lawn mower     retired   Social History Main Topics  . Smoking status: Former Smoker -- 1.00 packs/day for 20 years    Types: Cigarettes  Quit date: 04/06/1963  . Smokeless tobacco: Never Used  . Alcohol Use: No     Comment: 01/01/2012 "last alcohol ~ 50 yr ago"  . Drug Use: No  . Sexual Activity: Not Currently   Other Topics Concern  . Not on file   Social History Narrative   No living will   Requests wife as health care POA   Discussed DNR --he requests this (done 08/29/12)   Not sure about feeding tube---but might accept for some time   Review of Systems Appetite is fine Weight is stable ---not a great thing Sleep is not great--sleeps with oxygen. Often gets up to recliner (not PND--just more comfortable)    Objective:   Physical Exam  Constitutional: He appears well-developed and well-nourished. No distress.  Neck: Normal range of motion. Neck supple. No thyromegaly present.  Cardiovascular: Normal rate, regular rhythm and intact distal pulses.  Exam reveals no gallop.   Gr 2/6 systolic murmur  Pulmonary/Chest: Effort normal. No respiratory distress. He has no wheezes.  Widespread crackles in lower lobes  Musculoskeletal: He exhibits no edema or tenderness.  Skin:  No foot lesions or ulcers  Psychiatric: He has a normal mood and affect. His behavior is normal.  Frustrated with medical conditions but mood neutral          Assessment & Plan:

## 2014-03-12 NOTE — Assessment & Plan Note (Signed)
Hopefully still has acceptable control I wouldn't really make much changes unless he was up near 9%

## 2014-03-13 ENCOUNTER — Encounter: Payer: Self-pay | Admitting: *Deleted

## 2014-03-14 ENCOUNTER — Encounter (HOSPITAL_COMMUNITY): Payer: Self-pay | Admitting: Cardiovascular Disease

## 2014-03-15 NOTE — Telephone Encounter (Signed)
Called and spoke to pt. Informed pt of the recs per MR. Pt verbalized understanding and stated he would rather wait but if MR thinks this is urgent then he is willing to be referred out.   MR please advise.

## 2014-03-15 NOTE — Telephone Encounter (Signed)
[place] he can wait. Give appt feb 2016 to see me so he does not slip through cracks

## 2014-03-19 NOTE — Telephone Encounter (Signed)
Called and spoke to pt. Recall made for 05/2014. Pt verbalized understanding and denied any further questions or concerns at this time.

## 2014-04-01 ENCOUNTER — Other Ambulatory Visit: Payer: Self-pay | Admitting: Internal Medicine

## 2014-04-02 ENCOUNTER — Other Ambulatory Visit: Payer: Self-pay | Admitting: Internal Medicine

## 2014-04-17 DIAGNOSIS — J8410 Pulmonary fibrosis, unspecified: Secondary | ICD-10-CM | POA: Diagnosis not present

## 2014-04-17 DIAGNOSIS — R0689 Other abnormalities of breathing: Secondary | ICD-10-CM | POA: Diagnosis not present

## 2014-04-23 DIAGNOSIS — G5601 Carpal tunnel syndrome, right upper limb: Secondary | ICD-10-CM | POA: Diagnosis not present

## 2014-04-23 DIAGNOSIS — T8484XA Pain due to internal orthopedic prosthetic devices, implants and grafts, initial encounter: Secondary | ICD-10-CM | POA: Diagnosis not present

## 2014-04-23 DIAGNOSIS — E1142 Type 2 diabetes mellitus with diabetic polyneuropathy: Secondary | ICD-10-CM | POA: Diagnosis not present

## 2014-04-23 DIAGNOSIS — M25561 Pain in right knee: Secondary | ICD-10-CM | POA: Diagnosis not present

## 2014-05-01 ENCOUNTER — Other Ambulatory Visit (HOSPITAL_COMMUNITY): Payer: Self-pay | Admitting: Orthopaedic Surgery

## 2014-05-01 ENCOUNTER — Telehealth: Payer: Self-pay | Admitting: Cardiovascular Disease

## 2014-05-01 DIAGNOSIS — T8484XA Pain due to internal orthopedic prosthetic devices, implants and grafts, initial encounter: Secondary | ICD-10-CM

## 2014-05-01 DIAGNOSIS — Z96659 Presence of unspecified artificial knee joint: Principal | ICD-10-CM

## 2014-05-01 NOTE — Telephone Encounter (Signed)
New Message        Nurse Case Manager calling stating that she is trying to confirm pt's heart dx and see if pt is diagnosed with heart failure. Please call back and advise.

## 2014-05-01 NOTE — Telephone Encounter (Signed)
LMTCB ./CY 

## 2014-05-02 NOTE — Telephone Encounter (Signed)
REQUESTED  INFO  GIVEN TO  NURSE MANAGER  .Adonis Housekeeper

## 2014-05-06 DIAGNOSIS — N189 Chronic kidney disease, unspecified: Secondary | ICD-10-CM | POA: Diagnosis not present

## 2014-05-06 DIAGNOSIS — M0579 Rheumatoid arthritis with rheumatoid factor of multiple sites without organ or systems involvement: Secondary | ICD-10-CM | POA: Diagnosis not present

## 2014-05-06 DIAGNOSIS — G5602 Carpal tunnel syndrome, left upper limb: Secondary | ICD-10-CM | POA: Diagnosis not present

## 2014-05-06 DIAGNOSIS — R0902 Hypoxemia: Secondary | ICD-10-CM | POA: Diagnosis not present

## 2014-05-08 ENCOUNTER — Ambulatory Visit (HOSPITAL_COMMUNITY)
Admission: RE | Admit: 2014-05-08 | Discharge: 2014-05-08 | Disposition: A | Discharge status: Home / Self Care | Payer: Medicare Other | Source: Ambulatory Visit | Attending: Orthopaedic Surgery | Admitting: Orthopaedic Surgery

## 2014-05-08 ENCOUNTER — Other Ambulatory Visit (HOSPITAL_COMMUNITY): Payer: Self-pay | Admitting: Orthopaedic Surgery

## 2014-05-08 DIAGNOSIS — Z96659 Presence of unspecified artificial knee joint: Secondary | ICD-10-CM | POA: Insufficient documentation

## 2014-05-08 DIAGNOSIS — T8484XA Pain due to internal orthopedic prosthetic devices, implants and grafts, initial encounter: Secondary | ICD-10-CM

## 2014-05-08 DIAGNOSIS — M25561 Pain in right knee: Secondary | ICD-10-CM | POA: Insufficient documentation

## 2014-05-08 DIAGNOSIS — Z96651 Presence of right artificial knee joint: Secondary | ICD-10-CM | POA: Diagnosis not present

## 2014-05-08 MED ORDER — TECHNETIUM TC 99M MEDRONATE IV KIT
26.0000 | PACK | Freq: Once | INTRAVENOUS | Status: AC | PRN
  Administered 2014-05-08: 26 via INTRAVENOUS

## 2014-05-09 DIAGNOSIS — G5602 Carpal tunnel syndrome, left upper limb: Secondary | ICD-10-CM | POA: Diagnosis not present

## 2014-05-13 DIAGNOSIS — Z79899 Other long term (current) drug therapy: Secondary | ICD-10-CM | POA: Diagnosis not present

## 2014-05-13 DIAGNOSIS — G5602 Carpal tunnel syndrome, left upper limb: Secondary | ICD-10-CM | POA: Diagnosis not present

## 2014-05-14 DIAGNOSIS — E119 Type 2 diabetes mellitus without complications: Secondary | ICD-10-CM | POA: Diagnosis not present

## 2014-05-18 DIAGNOSIS — R0689 Other abnormalities of breathing: Secondary | ICD-10-CM | POA: Diagnosis not present

## 2014-05-18 DIAGNOSIS — J8410 Pulmonary fibrosis, unspecified: Secondary | ICD-10-CM | POA: Diagnosis not present

## 2014-05-24 ENCOUNTER — Telehealth: Payer: Self-pay | Admitting: Internal Medicine

## 2014-05-24 NOTE — Telephone Encounter (Signed)
Pt states he is having difficulty swallowing and would like to be seen. Pt scheduled to see Alonza Bogus PA 05/27/14@1 :30pm. Pt aware of appt.

## 2014-05-27 ENCOUNTER — Ambulatory Visit (INDEPENDENT_AMBULATORY_CARE_PROVIDER_SITE_OTHER): Payer: Medicare Other | Admitting: Gastroenterology

## 2014-05-27 ENCOUNTER — Encounter: Payer: Self-pay | Admitting: Gastroenterology

## 2014-05-27 VITALS — BP 130/68 | HR 60 | Ht 73.0 in | Wt 226.6 lb

## 2014-05-27 DIAGNOSIS — K222 Esophageal obstruction: Secondary | ICD-10-CM

## 2014-05-27 DIAGNOSIS — R1314 Dysphagia, pharyngoesophageal phase: Secondary | ICD-10-CM

## 2014-05-27 NOTE — Patient Instructions (Signed)
You have been scheduled for a Barium Esophogram at [facility] (1st floor of the hospital) on 05/30/14 at 10:30am. Please arrive 15 minutes prior to your appointment for registration. Make certain not to have anything to eat or drink 3 hours prior to your test. If you need to reschedule for any reason, please contact radiology at [phone] to do so. __________________________________________________________________ A barium swallow is an examination that concentrates on views of the esophagus. This tends to be a double contrast exam (barium and two liquids which, when combined, create a gas to distend the wall of the oesophagus) or single contrast (non-ionic iodine based). The study is usually tailored to your symptoms so a good history is essential. Attention is paid during the study to the form, structure and configuration of the esophagus, looking for functional disorders (such as aspiration, dysphagia, achalasia, motility and reflux) EXAMINATION You may be asked to change into a gown, depending on the type of swallow being performed. A radiologist and radiographer will perform the procedure. The radiologist will advise you of the type of contrast selected for your procedure and direct you during the exam. You will be asked to stand, sit or lie in several different positions and to hold a small amount of fluid in your mouth before being asked to swallow while the imaging is performed .In some instances you may be asked to swallow barium coated marshmallows to assess the motility of a solid food bolus. The exam can be recorded as a digital or video fluoroscopy procedure. POST PROCEDURE It will take 1-2 days for the barium to pass through your system. To facilitate this, it is important, unless otherwise directed, to increase your fluids for the next 24-48hrs and to resume your normal diet.  This test typically takes about 30 minutes to  perform. __________________________________________________________________________________

## 2014-05-27 NOTE — Progress Notes (Signed)
     05/27/2014 [name] [id number] 07/21/1931   History of Present Illness:  This is an 81 year old male who is previously known to [name].  He has history of esophageal stricture with dilation during his EGD in 09/2008; dilated with 79F [place].  He presents to our office today with complaints of dysphagia again over the past 2-3 months.  He says that it does not occur every time that he eats or even every day, but it is happening most days of the week.  He says that when it occurs the food gets stuck and eventually comes back up; does not have choking, coughing, etc.  Then he is usually able to proceed with eating cautiously without any major issues.  He is on protonix 40 mg daily.  He denies any weight loss or other related symptoms.  Says that this feels similar to what he experienced in the past.   Current Medications, Allergies, Past Medical History, Past Surgical History, Family History and Social History were reviewed in [employer] record.   Physical Exam: BP 130/68 mmHg  Pulse 60  Ht 6\' 1"  (1.854 m)  Wt 226 lb 9.6 oz (102.785 kg)  BMI 29.90 kg/m2 General: Well developed white male in no acute distress Head: Normocephalic and atraumatic Eyes:  Sclerae anicteric, conjunctiva pink  Ears: Normal auditory acuity Lungs: Clear throughout to auscultation Heart: Regular rate and rhythm; 3/6 SEM noted. Abdomen: Soft, non-distended.  BS present.  Non-tender. Musculoskeletal: Symmetrical with no gross deformities  Extremities: No edema  Neurological: Alert oriented x 4, grossly non-focal Psychological:  Alert and cooperative. Normal mood and affect  Assessment and Recommendations: -81 year old male with complaints of dysphagia and history of esophageal stricture.  Unfortunately he his on Plavix for a DES that was just placed in 01/2014 and I think that it would be deemed unsafe to hold the plavix at this time.  The patient understands.  Symptoms are  intermittent and he thinks they can be managed with dietary precautions alone for now.  I have asked him to take small bites, chew his food well, take sips between bites, and avoid foods that tend to get stuck.  Will check and esophagram to confirm stricture for now.  CC:  Dr. Silvio Pate

## 2014-05-28 NOTE — Progress Notes (Signed)
Agree with initial assessment and plans. Hopefully, eating carefully, will allow him to manage this problem until his cardiologist team says it would be safe to hold Plavix for therapeutic endoscopy.

## 2014-05-30 ENCOUNTER — Ambulatory Visit (HOSPITAL_COMMUNITY)
Admission: RE | Admit: 2014-05-30 | Discharge: 2014-05-30 | Disposition: A | Discharge status: Home / Self Care | Payer: Medicare Other | Source: Ambulatory Visit | Attending: Gastroenterology | Admitting: Gastroenterology

## 2014-05-30 DIAGNOSIS — K219 Gastro-esophageal reflux disease without esophagitis: Secondary | ICD-10-CM | POA: Diagnosis not present

## 2014-05-30 DIAGNOSIS — K222 Esophageal obstruction: Secondary | ICD-10-CM

## 2014-05-30 DIAGNOSIS — K224 Dyskinesia of esophagus: Secondary | ICD-10-CM | POA: Insufficient documentation

## 2014-05-30 DIAGNOSIS — R1314 Dysphagia, pharyngoesophageal phase: Secondary | ICD-10-CM

## 2014-05-30 DIAGNOSIS — R1310 Dysphagia, unspecified: Secondary | ICD-10-CM | POA: Diagnosis present

## 2014-06-04 ENCOUNTER — Other Ambulatory Visit: Payer: Self-pay | Admitting: Cardiovascular Disease

## 2014-06-04 ENCOUNTER — Encounter: Payer: Self-pay | Admitting: *Deleted

## 2014-06-04 ENCOUNTER — Other Ambulatory Visit: Payer: Self-pay | Admitting: *Deleted

## 2014-06-04 MED ORDER — OMEPRAZOLE 40 MG PO CPDR: DELAYED_RELEASE_CAPSULE | ORAL | Status: DC

## 2014-06-16 DIAGNOSIS — J8410 Pulmonary fibrosis, unspecified: Secondary | ICD-10-CM | POA: Diagnosis not present

## 2014-06-16 DIAGNOSIS — R0689 Other abnormalities of breathing: Secondary | ICD-10-CM | POA: Diagnosis not present

## 2014-07-09 ENCOUNTER — Ambulatory Visit (INDEPENDENT_AMBULATORY_CARE_PROVIDER_SITE_OTHER): Payer: Medicare Other | Admitting: Internal Medicine

## 2014-07-09 ENCOUNTER — Encounter: Payer: Self-pay | Admitting: Internal Medicine

## 2014-07-09 VITALS — BP 122/60 | HR 63 | Ht 73.0 in | Wt 228.0 lb

## 2014-07-09 DIAGNOSIS — I00 Rheumatic fever without heart involvement: Secondary | ICD-10-CM

## 2014-07-09 DIAGNOSIS — R0600 Dyspnea, unspecified: Secondary | ICD-10-CM | POA: Diagnosis not present

## 2014-07-09 DIAGNOSIS — M0520 Rheumatoid vasculitis with rheumatoid arthritis of unspecified site: Secondary | ICD-10-CM

## 2014-07-09 DIAGNOSIS — Z79899 Other long term (current) drug therapy: Secondary | ICD-10-CM | POA: Diagnosis not present

## 2014-07-09 NOTE — Progress Notes (Signed)
Subjective:    Patient ID: [name], male    DOB: 03/20/1932, 81 y.o.   MRN: [id number]  HPI  DYSPNEA - since July-Sept 2009  - Coronary artery disease. s/p CABG x 5 in 2002.  Dyspnea started following lopressor Jan-July 2009 and lisinopril increae July 2009. No relief despite stopping agents July 2010  -   Myoview on April 17, 2007 was  nonischemic with an EF of 51% and inferobasal wall scar.   - CAth Oct 2009:    - . Severe native three-vessel coronary artery disease. 2. Status post multivessel coronary bypass surgery with all grafts  patent. Normal LVEF and LVEDP - PFT - Mixed pattern on spiro. Mild restn on lung volumeswith near normal DLCO.   - Fev1 2.2L/73%, FVC 3.21L/70%, Ratio 68 (67),  TLC 4.7L/68%, RV 1.5L/55%, DLCO 79%.  -CPST 05/06/2008: Normal effot.    - Reduced VO2 max 20.5/65%, REduced AT 8.2/40%, Normal breathing reservce of 55%, submaximal heart rate response 112/77%,  flattened O2 pulse response at peak exercise - 84ml/beat at 85%. No VQ mismatch abnormalities. - ECHO 09/04/2008: LVH, ef 60%, mild AS and unchanged from prior   - CT chest   - Non specific Interstitial Lung disease NOS. Stable pulm infilratates RLL ggo > LLL ggo. 03/2008 -> 10/2008 -> June 2011: stable - Rehab: never attended 2009-2013 due to cost   -  reports that he quit smoking about 50 years ago. His smoking use included Cigarettes. He has a 20 pack-year smoking history. He has never used smokeless tobacco.  OV 10/05/2011 Last seen September 22, 2009 and discharged from followup. He never attended rehab due to cost issues. Now reports that on 09/05/11 noticed abrupt dyspnea worsened from class 1 to 3. Also, edema. No chest pain, hemoptysis. So, 09/09/11 went to ER. Trop, BNP normal. No d-didimer. CXR reported as stable but am wondering if there is increased ILD. Given lasix and now edema better and dyspnea better and > 75% towads baseline  Walking desat test : resting 98/94% -> 3 laps x 185 feet; HR 113/pulse  ox 96%   Past, Family, Social reviewed: no change since last visit in 2 year. No major changes to health in 2 years  REC Do not know why exactly you are more short of breath The main difference I think we notice compared to 2 years ago is the slightly more crackling noise in lung but this is not showing up on walking test or xray Best you have breathing test called PFT Return to see me or NP or DR Halford Chessman  for results of PFt in next 4 weeks   OV 11/22/2011 Followup test results: Dyspnea is unchanged since last. IN review of tests: PFTs July 2013 show worsening restriction since 2010 along with reduced diffusion. The RLL baseline GGO might be worse in July 2013 compared to 2011. In addition, autommune profile shows VERY HIGH TITERS of CCP antibody > 300 which is pathognomonic of rheumtoid arthritis lung involvement. RF is only borderline elevated a 23   PFT 10/12/11>>FEV1 2.02 (70%), Fvc 2.8/72%, TLC 4.07 (59%), DLCO 62%, no BD: RESTRICTION - WORSE since 2010 -(2010:  Fev1 2.2L/73%, FVC 3.21L/70%, Ratio 68 (67),  TLC 4.7L/68%, RV 1.5L/55%, DLCO 79%)   Echo 05/04/11>>mild LVH, EF 55 to [id number], grade 1 diastolic dysfx, mild AS, mild MR, PAS 34 mmHg  CT 11/02/11 - calcification suspicious for aortic valve and some GGO esp RLL > LLL (? RLL worse compared to 2011)  Past, Family, Social reviewed: new problem is that  rT flank and RUQ pain. No associated weight loss but has gained. Come and goes. Mild pain. Unclear what aggravating factors and relieving factors. Annoying quality.    Have blood work today related to lung disease  I will talk to Dr Johnsie Cancel  I will call you back after results are in and after talking to Dr Johnsie Cancel  ...  Update - see phone note 11/26/11. I will send it to Dr Johnsie Cancel   Telephone call 8/22/013 hutoimmune test results: RF and CCP antibodies positive. Esp CCP strongly positive. 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 Dyspnea is from this +/- joint pain that he  might be having. He needs immune     Cardiology office note November 2013  He was admitted 9/27-10/1 with chest pain. Myocardial infarction was ruled out. Cardiac catheterization demonstrated high-grade disease in the SVG-RCA as well as ostial PDA distal to the graft. He had some complications from his diagnostic cath with groin hematoma. He was brought back to the Cath Lab several days later for PCI with DES to the [facility] and cutting PTCA to the [place]. Dual antiplatelet therapy recommended for one year.  LHC 12/31/11: dLM 80%, LAD 100%, circumflex 100%, RCA 100%, proximal SVG-OM 30%, SVG-D1 ok, SVG-RCA 99% distal and 80% ostial PDA distal to graft, LIMA-LAD ok with some left to right collaterals.  PCI 01/03/12: Promus DES to the SVG-RCA and Cutting Balloon angioplasty to the ostial PDA.  Echocardiogram 12/31/11: EF [id number], grade 1 diastolic dysfunction, mild aortic stenosis, mean gradient 9, mild LAE.  Labs (10/13): K 4.5, creatinine 1.29, ALT 14, LDL 69, Hgb 12.5  Since d/c, he is doing well. No chest pain. Dyspnea improved. Probably Class IIb. No syncope. No orthopnea, PND, edema   OV 04/27/2012  [name] returns for followup after 6 months. This is for dyspnea out of proportion to his interstitial lung disease. His interstitial lung disease is on the basis of rheumatoid arthritis. Since I last saw him he has seen rheumatologist  Dr [name], who has started him on Imuran and prednisone. He does be that he took the prednisone only for a short time but he continues on Imuran right now. He feels that this intervention only helped his arthritic symptoms which came to light after the diagnosis of serum antibody positivity rheumatoid arthritis. However it does not help his dyspnea. Then in September 2013 J. chest pain and had cardiac catheterization and status post PCI. He followed up with cardiology in November 2013 and was noted to have improved dyspnea but he tells me now that his dyspnea never  really improved. Currently he is dyspneic for simple things like tying his shoelaces, bending over. It is also dyspneic when he is sitting and doing nothing and he feels like he is to take a big deep breath. Is also dyspneic with exertion and relieved with rest. Severity is moderate. Symptoms are unimproved but stable. There is no associated cough. Of note he went to the emergency room early in January 2014 and was given albuterol as needed which he says helps his symptoms. Labs on 04/07/2047 normal. He is really frustrated by his dyspnea.  So far we have never been able to improve his dyspnea. His rheumatologist is concerned about worsening interstitial lung disease  Of note in the past we've had several discussions about attending pulmonary rehabilitation but due to cost factors he is refused  I screened him about depression  and anxiety and he denies this.  Past, Family, Social reviewed: no change since last visit other than noted as above   rec I understand it is frustrating that they've not been able to improve your shortness of breath despite several interventions and new diagnoses  Please repeat pulmonary function testing and if it shows the pulmonary fibrosis is getting worse I will talk to her rheumatologist about changing some medications  - However this is only meant to prevent further worsening and will not improve things from where they are  In order to improve things from where they are you absolutely need pulmonary rehabilitation and I made a referral to them. We will work with them about reducing cost  also start empiric dulera samples; he will opick this up when he comes for PFT  Followup based on our phone conversation of pulmonary function test results   Ov 05/05/2012  [name] returns acute followup.. After his visit last week he did have pulmonary function test on 04/28/2012 when compared to his pre-Imuran pulmonary function test in July 2013 current pulmonary function test  shows worsening in terms of spirometry with improvement in terms of diffusion capacity. Specifically his FEV1 is now 1.77 L/62% and this is down by 12%. His FVC is 2.62 L/59% and this is decreased by 7%. His total lung capacity is 4 L/59% and this is unchanged. His diffusion capacity is 15.7/71% and this is actually improved by 11%. He is started later a few days ago but this has not made any impact in his symptoms.  However yesterday he developed an acute symptomatology of shortness of breath and some nausea and up in the emergency room. Labs are listed below which have reviewed in the look okay. Emergency room services discharge him on Tamiflu and antibiotics. He is currently feeling better. There no new issues other than chronic worsening of dyspnea  Walking desaturation test on 05/05/2012 185 feet x 3 laps:  did NOT desaturate. Rest pulse ox was 98%, final pulse ox was 96%. HR response 69/min at rest to 82/min at peak exertion.    OV 09/17/2013  Chief Complaint  Patient presents with  . Acute Visit    Pt last seen by MR on 04/2012. Pt c/o dyspnea with any activity. Pt states he has very little cough with grey mucous and CP with deep breaths.    Not seen in the past one year. Continues to have dyspnea on exertion. He is on Imuran although medication list does not have on prednisone anymore. He states dyspnea stable. His rheumatologist sent him back here because of persistent dyspnea and if it is class II to class III on exertion. Last pulmonary function tests, CT scan of the chest and oxygen evaluation was over a year ago. There no new problems. His weight has gained back a few pounds according to his history. This was associated with cough. It is exertional and relieved by rest.   PFT FVC fev1 ratio BD fev1 TLC DLCO comment intervention  2010 3.2L/70% 2.2L/73%   4.7L/68% 79%    10/12/11 2.8L/72% 2.02L/70% 72  4.07L/59% 62% Worsening restriction since 2010 Rheum will start immuran/pred. Also had  PCI in nov 2013  04/28/12 2.6L/59% 1.77L/62% 67  4.0L/59% 15.7/71%     #Shortness of breath and rheumatoid arthritis  - unclear why possibly worse  - High Resolution CT chest without contrast on ILD protocol. Only  Dr Lorin Picket or Dr. Vinnie Langton to read - do full PFT -  do ONO test on room air  - do walk test on room air in office (not 83mwd) now or at followup   #FOllowup  - after above in 4 weeks with me or my NP to regroup   10/15/2013 Follow up  MR pt here for 4 week follow up .   Reports breathing has been doing well, no new complaints.  Will finish Doxycycline 7/14 for [facility] Spotted Fever (was exposed to tick bite)  He has had several tests since last ov , we discussed and reviewed the following.  CT chest showed bronchiectasis w/ no ILD  ONO showed some desats .  Denies daytimes sleepiness or snoring. Previous abg w/ no hypercarbia noted 2013 .  No desats w/ ambulation  PFT showed no significant change with FEV1 56% , ratio 66,  FVC 61%.  Denies hemoptysis , chest pain, orthopnea, edema or fever.   Begin Oxygen 2l/m At bedtime   Trial Symbicort 80 2 puffs Twice daily  , rinse after use.  We are setting you up for a 2 D Echo .  Follow up Dr. Chase Caller in 4 weeks and As needed   Please contact office for sooner follow up if symptoms do not improve or worsen or seek emergency care    OV 11/26/2013  Chief Complaint  Patient presents with  . Follow-up    Pt wearing 2lpm at bedtime. Pt c/o DOE, mild dry cough and chest tightness when SOB. Pt had CT in 09/2013 and echo in 10/2013.     Followup dyspnea multifactorial  - Overnight oxygen test showed desaturations at night. He is using 2 L nasal cannula at night. Despite oxygen use he is not feeling any better. His main complaint is that the slips off . He also tried Symbicort but this did not help and in fact it paradoxically increased bronchoconstriction subjectively and therefore he has stopped this. Pulmonary  function tests does not show any decline  CT scan of the chest does not show any clear cut evidence of residual lung disease other than old bronchiectasis is mild. Echo 10/31/2013: Shows grade 1 diastolic dysfunction and mild aortic stenosis but otherwise unremarkable with a left ventricular ejection fraction 55%. I offered a cardiopulmonary stress test repeat but he wants to talk to his cardiologist before deciding on this.  -  no new issues     OV 03/04/2014  Chief Complaint  Patient presents with  . Follow-up    Pt states his breathing has worsened since last OV. Pt states since he had a stent placement his breathing has worsened, increase in SOB. Pt c/o mild productive cough with light green mucus and chest pain with activity.     Follow-up dyspnea multifactorial in the setting of rheumatoid arthritis, coronary artery disease, obesity   Last visit was in August 2015. I recommended a cardiopulmonary stress test and cardiology evaluation at that point in time. Now he tells me that in October 2015 he had a new cardiac stent placed and this seemed to temporarily improve his dyspnea but then he was started on brilinta which then made his dyspnea worse. Now for the last 2 weeks he's off this drug and is on Plavix instead but still dyspnea has not improved. Currently dyspnea is rated as moderate-severe. It is unchanged from his baseline pre-stent. It is brought on by exertion for 40 yards and relieved by rest. There is associated with chest burning with this which is chronic and long-standing and unchanged. There  is no radiation of the chest pain or cough or wheezing. His quality of life is significantly impacted by dyspnea  He has significant visceral obesity and he has not lost any weight. He says he has tried different diets but says the problem is with the weighing scales     OV 07/09/2014  Chief Complaint  Patient presents with  . Follow-up    Pt stated his breathing is unchanged since  last OV. Pt c/o mild cough with grey mucus production and CP when active.    Follow-up refractory dyspnea which is multifactorial in the setting of rheumatoid arthritis, coronary artery disease, obesity, diastolic dysfunction  Last seen November 2015. At that time I recommended cardiopulmonary stress test. I got clearance from this from his cardiologist but he decided to wait. He returns for follow-up. Dyspnea still persists and is moderate to severe. Associated with chest burning that is chronic and long-standing and unchanged. His quality of life is significantly impacted. He is frustrated. It is not worse however. He is now open to having his cardiopulmonary stress test. I have cautioned him that we might not get much useful information but it'll be a good serial test to give some data  Review of Systems  Constitutional: Negative for fever and unexpected weight change.  HENT: Negative for congestion, dental problem, ear pain, nosebleeds, postnasal drip, rhinorrhea, sinus pressure, sneezing, sore throat and trouble swallowing.   Eyes: Negative for redness and itching.  Respiratory: Positive for cough and shortness of breath. Negative for chest tightness and wheezing.   Cardiovascular: Positive for chest pain. Negative for palpitations and leg swelling.  Gastrointestinal: Negative for nausea and vomiting.  Genitourinary: Negative for dysuria.  Musculoskeletal: Negative for joint swelling.  Skin: Negative for rash.  Neurological: Negative for headaches.  Hematological: Does not bruise/bleed easily.  Psychiatric/Behavioral: Negative for dysphoric mood. The patient is not nervous/anxious.        Objective:   Physical Exam  Filed Vitals:   07/09/14 1048  BP: 122/60  Pulse: 63  Height: 6\' 1"  (1.854 m)  Weight: 228 lb (103.42 kg)  SpO2: 95%   Focused exam only. Respiratory exam shows some bibasilar crackles.  Alert and oriented 3 neurologic exam Cardiac exam normal heart  sounds Abdominal exam visible obesity present soft nontender Extremities no cyanosis no clubbing no edema     Assessment & Plan:     ICD-9-CM ICD-10-CM   1. Dyspnea 786.09 R06.00 Cardiopulmonary exercise test  2. Rheumatoid arteritis 447.8 I00 Cardiopulmonary exercise test   We discuss cardiopulmonary stress testing is the next best test to evaluate his dyspnea. He has had this test once before. His dyspnea is clearly multifactorial. I told him that repeat testing might sometimes give data but at least will give [place] a sense of his excess capacity and his prognosis. He is willing to undergo the test.   Dr. Brand Males, M.D., [facility].C.P Pulmonary and Critical Care Medicine Staff Physician [place] [facility] Pager: [phone], If no answer or between  15:00h - 7:00h: call [phone]  07/09/2014 11:12 AM

## 2014-07-09 NOTE — Patient Instructions (Addendum)
Dyspnea  Rheumatoid arteritis   REfer CPST bike test Return to see me after cpst bike test

## 2014-07-16 ENCOUNTER — Ambulatory Visit (INDEPENDENT_AMBULATORY_CARE_PROVIDER_SITE_OTHER): Payer: Medicare Other | Admitting: Internal Medicine

## 2014-07-16 ENCOUNTER — Encounter: Payer: Self-pay | Admitting: Internal Medicine

## 2014-07-16 VITALS — BP 140/60 | HR 66 | Ht 73.0 in | Wt 226.8 lb

## 2014-07-16 DIAGNOSIS — R1310 Dysphagia, unspecified: Secondary | ICD-10-CM

## 2014-07-16 DIAGNOSIS — K219 Gastro-esophageal reflux disease without esophagitis: Secondary | ICD-10-CM | POA: Diagnosis not present

## 2014-07-16 DIAGNOSIS — K224 Dyskinesia of esophagus: Secondary | ICD-10-CM

## 2014-07-16 DIAGNOSIS — R933 Abnormal findings on diagnostic imaging of other parts of digestive tract: Secondary | ICD-10-CM | POA: Diagnosis not present

## 2014-07-16 NOTE — Patient Instructions (Signed)
Please follow up with Dr. Perry as needed 

## 2014-07-16 NOTE — Progress Notes (Signed)
HISTORY OF PRESENT ILLNESS:  [name] is a 81 y.o. male with multiple significant medical problems as listed below. He was evaluated by the GI physician assistant 05/27/2014 for dysphagia. She does have a history of GERD, care by peptic stricture for which she underwent dilation of the esophagus June 2010. He states that this was helpful. His more recent history was that of 3 months of intermittent solid AND LIQUID dysphagia. His PPI was changed from pantoprazole 40 mg daily to omeprazole 40 mg twice a day. He was set up for a diagnostic esophagram which was performed 05/30/2014. He was found to have esophageal dysmotility without tertiary contractions. Moderate reflux was present. However, no mucosal or structural abnormality. 13 mm tablet passed easily through the GE junction. He presents today for follow-up. He does report that his swallowing has been better since his last visit. He does have restrictions for possible therapeutic procedures as he is on Plavix for cardiac intervention October 2015. GI review of systems is otherwise negative  REVIEW OF SYSTEMS:  All non-GI ROS negative except for arthritis, back pain, visual change, hearing problems, shortness of breath, ankle edema  Past Medical History  Diagnosis Date  . Hyperlipidemia   . Hypertension   . GERD (gastroesophageal reflux disease)   . Overweight (BMI 25.0-29.9)     BMI 29  . Interstitial lung disease     NOS  . Diverticulosis   . Esophageal stricture     a. s/p dilation spring 2010  . CAD (coronary artery disease)     a. s/p CABG (2001)  b. s/p DES to RCA and cutting POBA to ostial PDA (2013)   c. s/p DES to SVG to OM2 (01/14/14)  . History of PFTs     mixed pattern on spiro. mild restn on lung volumes with near normal DLCO. Pattern can be explained by CABG scar. Fev1 2.2L/73%, ratio 68 (67), TLC 4.7/68%,RV 1.5L/55%,DLCO 79%  . Colon polyps   . Chronic kidney disease, stage III (moderate)   . Chronic diastolic CHF  (congestive heart failure)     a) 09/13 ECHO- LVEF 99991111, grade 1 diastolic dysfunction, mild LA dilatation, atrial septal aneurysm, AV mobility restricted, but no sig AS by doppler; b) 09/04/08 ECHO- LVH, ef 60%, mild AS,  . Heart murmur   . Chronic lower back pain   . Dyspnea 2009 since July -Sept    05/06/08-[facility]-  normal effort, reduced VO2 max 20.5 /65%, reduced at 8.2/ 40%, normal breathing resetvca of 55%, submaximal heart rate response 112/77%, flattened o2 pluse response at peak exercise-12 ml/beat @ 85%, No VQ mismatch abnormalities, All c/w CIRC Limitation  . Hiatal hernia   . Type II diabetes mellitus   . Arthritis     osteoarthritis, s/p R TKR, and digits  . RA (rheumatoid arthritis)     [name]  . Seropositive rheumatoid arthritis   . Enlarged prostate     Past Surgical History  Procedure Laterality Date  . Total knee arthroplasty Right 03/2010    [name]  . Knee arthroscopy Right 2008  . Cataract extraction w/ intraocular lens  implant, bilateral Bilateral   . Coronary artery bypass graft  11/1999    CABG X5  . Coronary stent placement  02/2012    1 stent and balloon  . Shoulder arthroscopy with open rotator cuff repair and distal clavicle acrominectomy Left 02/27/2013    Procedure: LEFT SHOULDER ARTHROSCOPY WITH MINI OPEN ROTATOR CUFF REPAIR AND SUBACROMIAL DECOMPRESSION AND  DISTAL CLAVICLE RESECTION;  Surgeon: [name], MD;  Location: [facility];  Service: Orthopedics;  Laterality: Left;  . Trigger finger release Left 02/27/2013    Procedure: RELEASE TRIGGER FINGER/A-1 PULLEY;  Surgeon: [name], MD;  Location: [place];  Service: Orthopedics;  Laterality: Left;  . Cholecystectomy open  11/2003    [name]  . Joint replacement    . Esophagogastroduodenoscopy (egd) with esophageal dilation  2010  . Cardiac catheterization  08/2004    CP- no MI, Cath- small vessell disease   . Cardiac catheterization  12/31/2011    80% distal LM, 100% native LAD, LCx and  RCA, 30% prox SVG-OM, SVG-D1 normal, 99% distal, 80% ostial SVG-RCA distal to graft, LIMA-LAD normal; LVEF mildly decreased with posterior basal AK   . Coronary angioplasty with stent placement  01/03/2012    Successful DES to SVG-RCA and cutting balloon angioplasty ostial  PDA   . Coronary angioplasty with stent placement  01/14/2014    "1"  . Cardiac catheterization  2009    with patent grafts/notes 12/31/2011  . Left and right heart catheterization with coronary angiogram  12/31/2011    Procedure: LEFT AND RIGHT HEART CATHETERIZATION WITH CORONARY ANGIOGRAM;  Surgeon: Burnell Blanks, MD;  Location: [facility] CATH LAB;  Service: Cardiovascular;;  . Percutaneous coronary stent intervention (pci-s)  12/31/2011    Procedure: PERCUTANEOUS CORONARY STENT INTERVENTION (PCI-S);  Surgeon: Burnell Blanks, MD;  Location: [facility] CATH LAB;  Service: Cardiovascular;;  . Percutaneous coronary stent intervention (pci-s) N/A 01/03/2012    Procedure: PERCUTANEOUS CORONARY STENT INTERVENTION (PCI-S);  Surgeon: Peter M Martinique, MD;  Location: [facility] CATH LAB;  Service: Cardiovascular;  Laterality: N/A;  . Percutaneous coronary intervention-balloon only  01/03/2012    Procedure: PERCUTANEOUS CORONARY INTERVENTION-BALLOON ONLY;  Surgeon: Peter M Martinique, MD;  Location: [facility] CATH LAB;  Service: Cardiovascular;;  . Left and right heart catheterization with coronary angiogram N/A 01/14/2014    Procedure: LEFT AND RIGHT HEART CATHETERIZATION WITH CORONARY ANGIOGRAM;  Surgeon: Peter M Martinique, MD;  Location[facility] CATH LAB;  Service: Cardiovascular;  Laterality: N/A;  . Hand surgery      Social History [name]  reports that he quit smoking about 51 years ago. His smoking use included Cigarettes. He has a 20 pack-year smoking history. He has never used smokeless tobacco. He reports that he does not drink alcohol or use illicit drugs.  family history includes Alcohol abuse in his sister; COPD in his mother; Colon cancer  (age of onset: 85) in his brother; Diabetes in his brother; Heart attack in his father; Heart disease in his father; Stroke in his sister.  Allergies  Allergen Reactions  . Doxazosin Mesylate Other (See Comments)    dizziness  . Methocarbamol Rash       PHYSICAL EXAMINATION: Vital signs: BP 140/60 mmHg  Pulse 66  Ht 6\' 1"  (1.854 m)  Wt 226 lb 12.8 oz (102.876 kg)  BMI 29.93 kg/m2 General: Well-developed, well-nourished, no acute distress HEENT: Sclerae are anicteric, conjunctiva pink. Oral mucosa intact Lungs: Clear Heart: Regular Abdomen: soft, nontender, nondistended, no obvious ascites, no peritoneal signs, normal bowel sounds. No organomegaly. Extremities: No edema Psychiatric: alert and oriented x3. Cooperative     ASSESSMENT:  #1. Dysphagia. Principal problem at this point based on history and findings on esophagram are most consistent with esophageal dysmotility. Though he does have a history of GERD complicated by peptic stricture, no significant constricting lesion identified on esophagram. His history is  complicated by the fact that he requires Plavix for relatively recent coronary intervention 6 months ago   PLAN:  #1. Advised to continue to chew food well #2. Advised to continue omeprazole 40 mg daily. A component of esophageal dysmotility may have been reflux related thus explaining his seeming improvement over the past few months #3. For worsening problems or symptoms suggesting food impaction contact this office. We would consider endoscopy with empiric esophageal dilation after it is safe to have him off Plavix for a week. Otherwise, he will resume care with his primary providers  20 minutes was spent today for face-to-face with this patient. Greater than 50% of the time was used for consultation including reviewing his condition, its treatment, and outcomes. Also reviewed his studies with him and recommendations. Multiple questions answered satisfactorily

## 2014-07-17 DIAGNOSIS — J8410 Pulmonary fibrosis, unspecified: Secondary | ICD-10-CM | POA: Diagnosis not present

## 2014-07-17 DIAGNOSIS — R0689 Other abnormalities of breathing: Secondary | ICD-10-CM | POA: Diagnosis not present

## 2014-07-22 DIAGNOSIS — H3531 Nonexudative age-related macular degeneration: Secondary | ICD-10-CM | POA: Diagnosis not present

## 2014-07-22 DIAGNOSIS — H43813 Vitreous degeneration, bilateral: Secondary | ICD-10-CM | POA: Diagnosis not present

## 2014-07-22 DIAGNOSIS — H35372 Puckering of macula, left eye: Secondary | ICD-10-CM | POA: Diagnosis not present

## 2014-07-22 DIAGNOSIS — H3532 Exudative age-related macular degeneration: Secondary | ICD-10-CM | POA: Diagnosis not present

## 2014-07-23 ENCOUNTER — Ambulatory Visit (HOSPITAL_COMMUNITY): Payer: Medicare Other | Attending: Internal Medicine

## 2014-07-23 DIAGNOSIS — M0520 Rheumatoid vasculitis with rheumatoid arthritis of unspecified site: Secondary | ICD-10-CM

## 2014-07-23 DIAGNOSIS — R0600 Dyspnea, unspecified: Secondary | ICD-10-CM

## 2014-07-23 DIAGNOSIS — M069 Rheumatoid arthritis, unspecified: Secondary | ICD-10-CM | POA: Insufficient documentation

## 2014-07-30 ENCOUNTER — Ambulatory Visit: Payer: Medicare Other | Admitting: Internal Medicine

## 2014-07-31 ENCOUNTER — Telehealth: Payer: Self-pay | Admitting: Internal Medicine

## 2014-07-31 NOTE — Telephone Encounter (Signed)
lmtcb for pt.  

## 2014-07-31 NOTE — Telephone Encounter (Signed)
Spoke with Daneil Dan. Schedule pt on MR's next available. ROV has been scheduled on 08/28/14 at 10:45am.

## 2014-07-31 NOTE — Telephone Encounter (Signed)
Pt returned call

## 2014-07-31 NOTE — Telephone Encounter (Signed)
lmtcb x1 for pt. 

## 2014-07-31 NOTE — Telephone Encounter (Signed)
[phone], pt cb

## 2014-08-16 DIAGNOSIS — J8410 Pulmonary fibrosis, unspecified: Secondary | ICD-10-CM | POA: Diagnosis not present

## 2014-08-16 DIAGNOSIS — R0689 Other abnormalities of breathing: Secondary | ICD-10-CM | POA: Diagnosis not present

## 2014-08-27 ENCOUNTER — Other Ambulatory Visit: Payer: Self-pay | Admitting: Internal Medicine

## 2014-08-28 ENCOUNTER — Encounter: Payer: Self-pay | Admitting: Internal Medicine

## 2014-08-28 ENCOUNTER — Ambulatory Visit (INDEPENDENT_AMBULATORY_CARE_PROVIDER_SITE_OTHER): Payer: Medicare Other | Admitting: Internal Medicine

## 2014-08-28 VITALS — BP 142/64 | HR 63 | Ht 73.0 in | Wt 225.0 lb

## 2014-08-28 DIAGNOSIS — R0600 Dyspnea, unspecified: Secondary | ICD-10-CM | POA: Diagnosis not present

## 2014-08-28 NOTE — Progress Notes (Signed)
Subjective:    Patient ID: [name], male    DOB: [birth year], 81 y.o.   MRN: [id number]  HPI  DYSPNEA - since July-Sept 2009  - Coronary artery disease. s/p CABG x 5 in 2002.  Dyspnea started following lopressor Jan-July 2009 and lisinopril increae July 2009. No relief despite stopping agents July 2010  -   Myoview on April 17, 2007 was  nonischemic with an EF of 51% and inferobasal wall scar.   - CAth Oct 2009:    - . Severe native three-vessel coronary artery disease. 2. Status post multivessel coronary bypass surgery with all grafts  patent. Normal LVEF and LVEDP - PFT - Mixed pattern on spiro. Mild restn on lung volumeswith near normal DLCO.   - Fev1 2.2L/73%, FVC 3.21L/70%, Ratio 68 (67),  TLC 4.7L/68%, RV 1.5L/55%, DLCO 79%.  -CPST 05/06/2008: Normal effot.    - Reduced VO2 max 20.5/65%, REduced AT 8.2/40%, Normal breathing reservce of 55%, submaximal heart rate response 112/77%,  flattened O2 pulse response at peak exercise - 68ml/beat at 85%. No VQ mismatch abnormalities. - ECHO 09/04/2008: LVH, ef 60%, mild AS and unchanged from prior   - CT chest   - Non specific Interstitial Lung disease NOS. Stable pulm infilratates RLL ggo > LLL ggo. 03/2008 -> 10/2008 -> June 2011: stable - Rehab: never attended 2009-2013 due to cost   -  reports that he quit smoking about 50 years ago. His smoking use included Cigarettes. He has a 20 pack-year smoking history. He has never used smokeless tobacco.  OV 10/05/2011 Last seen September 22, 2009 and discharged from followup. He never attended rehab due to cost issues. Now reports that on 09/05/11 noticed abrupt dyspnea worsened from class 1 to 3. Also, edema. No chest pain, hemoptysis. So, 09/09/11 went to ER. Trop, BNP normal. No d-didimer. CXR reported as stable but am wondering if there is increased ILD. Given lasix and now edema better and dyspnea better and > 75% towads baseline  Walking desat test : resting 98/94% -> 3 laps x 185 feet; HR 113/pulse  ox 96%   Past, Family, Social reviewed: no change since last visit in 2 year. No major changes to health in 2 years  REC Do not know why exactly you are more short of breath The main difference I think we notice compared to 2 years ago is the slightly more crackling noise in lung but this is not showing up on walking test or xray Best you have breathing test called PFT Return to see me or NP or DR Halford Chessman  for results of PFt in next 4 weeks   OV 11/22/2011 Followup test results: Dyspnea is unchanged since last. IN review of tests: PFTs July 2013 show worsening restriction since 2010 along with reduced diffusion. The RLL baseline GGO might be worse in July 2013 compared to 2011. In addition, autommune profile shows VERY HIGH TITERS of CCP antibody > 300 which is pathognomonic of rheumtoid arthritis lung involvement. RF is only borderline elevated a 23   PFT 10/12/11>>FEV1 2.02 (70%), Fvc 2.8/72%, TLC 4.07 (59%), DLCO 62%, no BD: RESTRICTION - WORSE since 2010 -(2010:  Fev1 2.2L/73%, FVC 3.21L/70%, Ratio 68 (67),  TLC 4.7L/68%, RV 1.5L/55%, DLCO 79%)   Echo 05/04/11>>mild LVH, EF 55 to [id number], grade 1 diastolic dysfx, mild AS, mild MR, PAS 34 mmHg  CT 11/02/11 - calcification suspicious for aortic valve and some GGO esp RLL > LLL (? RLL worse compared to 2011)  Past, Family, Social reviewed: new problem is that  rT flank and RUQ pain. No associated weight loss but has gained. Come and goes. Mild pain. Unclear what aggravating factors and relieving factors. Annoying quality.    Have blood work today related to lung disease  I will talk to Dr Johnsie Cancel  I will call you back after results are in and after talking to Dr Johnsie Cancel  ...  Update - see phone note 11/26/11. I will send it to Dr Johnsie Cancel   Telephone call 8/22/013 hutoimmune test results: RF and CCP antibodies positive. Esp CCP strongly positive. 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 Dyspnea is from this +/- joint pain that he  might be having. He needs immune     Cardiology office note November 2013  He was admitted 9/27-10/1 with chest pain. Myocardial infarction was ruled out. Cardiac catheterization demonstrated high-grade disease in the SVG-RCA as well as ostial PDA distal to the graft. He had some complications from his diagnostic cath with groin hematoma. He was brought back to the Cath Lab several days later for PCI with DES to the [facility] and cutting PTCA to the [place]. Dual antiplatelet therapy recommended for one year.  LHC 12/31/11: dLM 80%, LAD 100%, circumflex 100%, RCA 100%, proximal SVG-OM 30%, SVG-D1 ok, SVG-RCA 99% distal and 80% ostial PDA distal to graft, LIMA-LAD ok with some left to right collaterals.  PCI 01/03/12: Promus DES to the SVG-RCA and Cutting Balloon angioplasty to the ostial PDA.  Echocardiogram 12/31/11: EF [id number], grade 1 diastolic dysfunction, mild aortic stenosis, mean gradient 9, mild LAE.  Labs (10/13): K 4.5, creatinine 1.29, ALT 14, LDL 69, Hgb 12.5  Since d/c, he is doing well. No chest pain. Dyspnea improved. Probably Class IIb. No syncope. No orthopnea, PND, edema   OV 04/27/2012  Mr. Sloman returns for followup after 6 months. This is for dyspnea out of proportion to his interstitial lung disease. His interstitial lung disease is on the basis of rheumatoid arthritis. Since I last saw him he has seen rheumatologist  Dr [name], who has started him on Imuran and prednisone. He does be that he took the prednisone only for a short time but he continues on Imuran right now. He feels that this intervention only helped his arthritic symptoms which came to light after the diagnosis of serum antibody positivity rheumatoid arthritis. However it does not help his dyspnea. Then in September 2013 J. chest pain and had cardiac catheterization and status post PCI. He followed up with cardiology in November 2013 and was noted to have improved dyspnea but he tells me now that his dyspnea never  really improved. Currently he is dyspneic for simple things like tying his shoelaces, bending over. It is also dyspneic when he is sitting and doing nothing and he feels like he is to take a big deep breath. Is also dyspneic with exertion and relieved with rest. Severity is moderate. Symptoms are unimproved but stable. There is no associated cough. Of note he went to the emergency room early in January 2014 and was given albuterol as needed which he says helps his symptoms. Labs on 04/07/2047 normal. He is really frustrated by his dyspnea.  So far we have never been able to improve his dyspnea. His rheumatologist is concerned about worsening interstitial lung disease  Of note in the past we've had several discussions about attending pulmonary rehabilitation but due to cost factors he is refused  I screened him about depression  and anxiety and he denies this.  Past, Family, Social reviewed: no change since last visit other than noted as above   rec I understand it is frustrating that they've not been able to improve your shortness of breath despite several interventions and new diagnoses  Please repeat pulmonary function testing and if it shows the pulmonary fibrosis is getting worse I will talk to her rheumatologist about changing some medications  - However this is only meant to prevent further worsening and will not improve things from where they are  In order to improve things from where they are you absolutely need pulmonary rehabilitation and I made a referral to them. We will work with them about reducing cost  also start empiric dulera samples; he will opick this up when he comes for PFT  Followup based on our phone conversation of pulmonary function test results   Ov 05/05/2012  Mr. Resendis returns acute followup.. After his visit last week he did have pulmonary function test on 04/28/2012 when compared to his pre-Imuran pulmonary function test in July 2013 current pulmonary function test  shows worsening in terms of spirometry with improvement in terms of diffusion capacity. Specifically his FEV1 is now 1.77 L/62% and this is down by 12%. His FVC is 2.62 L/59% and this is decreased by 7%. His total lung capacity is 4 L/59% and this is unchanged. His diffusion capacity is 15.7/71% and this is actually improved by 11%. He is started later a few days ago but this has not made any impact in his symptoms.  However yesterday he developed an acute symptomatology of shortness of breath and some nausea and up in the emergency room. Labs are listed below which have reviewed in the look okay. Emergency room services discharge him on Tamiflu and antibiotics. He is currently feeling better. There no new issues other than chronic worsening of dyspnea  Walking desaturation test on 05/05/2012 185 feet x 3 laps:  did NOT desaturate. Rest pulse ox was 98%, final pulse ox was 96%. HR response 69/min at rest to 82/min at peak exertion.    OV 09/17/2013  Chief Complaint  Patient presents with  . Acute Visit    Pt last seen by MR on 04/2012. Pt c/o dyspnea with any activity. Pt states he has very little cough with grey mucous and CP with deep breaths.    Not seen in the past one year. Continues to have dyspnea on exertion. He is on Imuran although medication list does not have on prednisone anymore. He states dyspnea stable. His rheumatologist sent him back here because of persistent dyspnea and if it is class II to class III on exertion. Last pulmonary function tests, CT scan of the chest and oxygen evaluation was over a year ago. There no new problems. His weight has gained back a few pounds according to his history. This was associated with cough. It is exertional and relieved by rest.   PFT FVC fev1 ratio BD fev1 TLC DLCO comment intervention  2010 3.2L/70% 2.2L/73%   4.7L/68% 79%    10/12/11 2.8L/72% 2.02L/70% 72  4.07L/59% 62% Worsening restriction since 2010 Rheum will start immuran/pred. Also had  PCI in nov 2013  04/28/12 2.6L/59% 1.77L/62% 67  4.0L/59% 15.7/71%     #Shortness of breath and rheumatoid arthritis  - unclear why possibly worse  - High Resolution CT chest without contrast on ILD protocol. Only  Dr Lorin Picket or Dr. Vinnie Langton to read - do full PFT -  do ONO test on room air  - do walk test on room air in office (not 58mwd) now or at followup   #FOllowup  - after above in 4 weeks with me or my NP to regroup   10/15/2013 Follow up  MR pt here for 4 week follow up .   Reports breathing has been doing well, no new complaints.  Will finish Doxycycline 7/14 for [facility] Spotted Fever (was exposed to tick bite)  He has had several tests since last ov , we discussed and reviewed the following.  CT chest showed bronchiectasis w/ no ILD  ONO showed some desats .  Denies daytimes sleepiness or snoring. Previous abg w/ no hypercarbia noted 2013 .  No desats w/ ambulation  PFT showed no significant change with FEV1 56% , ratio 66,  FVC 61%.  Denies hemoptysis , chest pain, orthopnea, edema or fever.   Begin Oxygen 2l/m At bedtime   Trial Symbicort 80 2 puffs Twice daily  , rinse after use.  We are setting you up for a 2 D Echo .  Follow up Dr. Chase Caller in 4 weeks and As needed   Please contact office for sooner follow up if symptoms do not improve or worsen or seek emergency care    OV 11/26/2013  Chief Complaint  Patient presents with  . Follow-up    Pt wearing 2lpm at bedtime. Pt c/o DOE, mild dry cough and chest tightness when SOB. Pt had CT in 09/2013 and echo in 10/2013.     Followup dyspnea multifactorial  - Overnight oxygen test showed desaturations at night. He is using 2 L nasal cannula at night. Despite oxygen use he is not feeling any better. His main complaint is that the slips off . He also tried Symbicort but this did not help and in fact it paradoxically increased bronchoconstriction subjectively and therefore he has stopped this. Pulmonary  function tests does not show any decline  CT scan of the chest does not show any clear cut evidence of residual lung disease other than old bronchiectasis is mild. Echo 10/31/2013: Shows grade 1 diastolic dysfunction and mild aortic stenosis but otherwise unremarkable with a left ventricular ejection fraction 55%. I offered a cardiopulmonary stress test repeat but he wants to talk to his cardiologist before deciding on this.  -  no new issues     OV 03/04/2014  Chief Complaint  Patient presents with  . Follow-up    Pt states his breathing has worsened since last OV. Pt states since he had a stent placement his breathing has worsened, increase in SOB. Pt c/o mild productive cough with light green mucus and chest pain with activity.     Follow-up dyspnea multifactorial in the setting of rheumatoid arthritis, coronary artery disease, obesity   Last visit was in August 2015. I recommended a cardiopulmonary stress test and cardiology evaluation at that point in time. Now he tells me that in October 2015 he had a new cardiac stent placed and this seemed to temporarily improve his dyspnea but then he was started on brilinta which then made his dyspnea worse. Now for the last 2 weeks he's off this drug and is on Plavix instead but still dyspnea has not improved. Currently dyspnea is rated as moderate-severe. It is unchanged from his baseline pre-stent. It is brought on by exertion for 40 yards and relieved by rest. There is associated with chest burning with this which is chronic and long-standing and unchanged. There  is no radiation of the chest pain or cough or wheezing. His quality of life is significantly impacted by dyspnea  He has significant visceral obesity and he has not lost any weight. He says he has tried different diets but says the problem is with the weighing scales     OV 07/09/2014  Chief Complaint  Patient presents with  . Follow-up    Pt stated his breathing is unchanged since  last OV. Pt c/o mild cough with grey mucus production and CP when active.    Follow-up refractory dyspnea which is multifactorial in the setting of rheumatoid arthritis, coronary artery disease, obesity, diastolic dysfunction  Last seen November 2015. At that time I recommended cardiopulmonary stress test. I got clearance from this from his cardiologist but he decided to wait. He returns for follow-up. Dyspnea still persists and is moderate to severe. Associated with chest burning that is chronic and long-standing and unchanged. His quality of life is significantly impacted. He is frustrated. It is not worse however. He is now open to having his cardiopulmonary stress test. I have cautioned him that we might not get much useful information but it'll be a good serial test to give some data    OV 08/28/2014  Chief Complaint  Patient presents with  . Follow-up    Pt here to discuss CPST/dyspnea:Pt stated his breathing is unchanged since last OV. Pt c/o mild cough with grey mucus production and CP when active.     Follow-up dyspnea after cardiopulmonary stress test. Formal interpretation of the cardiopulmonary stress test is pending. This was done 07/24/2014. I personally reviewed the trace. My interpretation shows a submaximal effort with a respiratory exchange ratio of 0.9. However he perceived that he gave an excellent effort. He stopped because of dyspnea and chest pain. He reports these as ongoing problems. He is frustrated that they've not been able to identify a solution for him.  Key findings where a low VO2 max of 11.1 mL per kilogram per minute at 54% associated with a low anaerobic threshold of 33.3%. This was also associated with a high heart rate reserve ordered in other words inadequate heart rate response with a heart rate reserve of 56 bpm. There were no EKG changes. In addition his blood pressure response was flat or hypotensive. Blood pressure at rest was 124/62 with a peak blood  pressure of 108/52. His peak heart rate was 84/m.  There was no ventilatory limitation but his spirometry did show restrictive/obstructive pattern with an FEV1 of 1.6 L/45% and a ratio of 68/92%  Overall the cardia promised stress test at least in terms of increased heart rate reserve was similar to the previous one  Current outpatient prescriptions:  .  amLODipine (NORVASC) 10 MG tablet, TAKE 1 TABLET BY MOUTH DAILY, Disp: 90 tablet, Rfl: 3 .  aspirin EC 81 MG tablet, Take 81 mg by mouth at bedtime., Disp: , Rfl:  .  azaTHIOprine (IMURAN) 50 MG tablet, Take 50 mg by mouth daily. , Disp: , Rfl:  .  carvedilol (COREG) 12.5 MG tablet, TAKE 1 TABLET BY MOUTH TWICE A DAY WITH MEALS, Disp: 60 tablet, Rfl: 5 .  clopidogrel (PLAVIX) 75 MG tablet, Take 1 tablet (75 mg total) by mouth daily., Disp: 30 tablet, Rfl: 7 .  DHA-Vitamin C-Lutein (EYE HEALTH FORMULA PO), Take 1 tablet by mouth daily. , Disp: , Rfl:  .  furosemide (LASIX) 40 MG tablet, Take 0.5 tablets (20 mg total) by mouth daily., Disp: ,  Rfl:  .  gabapentin (NEURONTIN) 300 MG capsule, TAKE 3 CAPSULES BY MOUTH EACH MORNING AND 4 TO 5 CAPSULES AT BEDTIME, Disp: 240 capsule, Rfl: 0 .  glucose blood (ONE TOUCH TEST STRIPS) test strip, Used to test blood sugar once daily dx: 250.60, Disp: 100 each, Rfl: 1 .  isosorbide mononitrate (IMDUR) 30 MG 24 hr tablet, TAKE 1 TABLET BY MOUTH DAILY, Disp: 30 tablet, Rfl: 3 .  losartan (COZAAR) 100 MG tablet, TAKE 1 TABLET BY MOUTH DAILY, Disp: 90 tablet, Rfl: 3 .  metFORMIN (GLUCOPHAGE) 1000 MG tablet, Take 1,000 mg by mouth 2 (two) times daily with a meal., Disp: , Rfl:  .  Multiple Vitamin (MULTIVITAMIN WITH MINERALS) TABS tablet, Take 1 tablet by mouth daily., Disp: , Rfl:  .  nitroGLYCERIN (NITROSTAT) 0.4 MG SL tablet, Place 0.4 mg under the tongue every 5 (five) minutes as needed for chest pain. Call 911 if not resolved after 3 doses, Disp: , Rfl:  .  omeprazole (PRILOSEC) 40 MG capsule, Take one po BID,  Disp: 180 capsule, Rfl: 3 .  ONE TOUCH LANCETS MISC, Used to test blood sugar once daily dx: 250.60, Disp: 100 each, Rfl: 1 .  pravastatin (PRAVACHOL) 40 MG tablet, Take 40 mg by mouth at bedtime., Disp: , Rfl:  .  vitamin B-12 (CYANOCOBALAMIN) 1000 MCG tablet, Take 1,000 mcg by mouth daily., Disp: , Rfl:    Review of Systems Discussion only visit    Objective:   Physical Exam   Filed Vitals:   08/28/14 1039  BP: 142/64  Pulse: 63  Height: 6\' 1"  (1.854 m)  Weight: 225 lb (102.059 kg)  SpO2: 93%   Discussion only visit     Assessment & Plan:     ICD-9-CM ICD-10-CM   1. Dyspnea 786.09 R06.00 Ambulatory referral to Pulmonology   Not sure how to interpret the cardiopulmonary stress test because of a lower respiratory exhange ratio. So he's possibly deconditioned. However, if one were to accept this is a maximal test than the looking at significant chronotropic incompetence which could be beta blocker mediated all coronary artery disease mediated. Certainly is exertional chest pain might fit in with bad native untreatable coronary artery disease but will need cards input on this hypothesis.    At this point in time I do not have good solutions for his dyspnea I will make a referral to [facility] for a second opinion  (> 50% of this 15 min visit spent in face to face counseling or/and coordination of care)   Dr. Brand Males, M.D., [facility].C.P Pulmonary and Critical Care Medicine Staff Physician [place] [facility] Pager: [phone], If no answer or between  15:00h - 7:00h: call [phone]  08/28/2014 11:18 AM

## 2014-08-28 NOTE — Patient Instructions (Signed)
ICD-9-CM ICD-10-CM   1. Dyspnea 786.09 R06.00 Ambulatory referral to Pulmonology    Refer to [facility] for pulmonary evaluation   Followup  6 months to report progress

## 2014-09-03 ENCOUNTER — Other Ambulatory Visit: Payer: Self-pay

## 2014-09-03 ENCOUNTER — Other Ambulatory Visit: Payer: Self-pay | Admitting: Cardiovascular Disease

## 2014-09-03 MED ORDER — CLOPIDOGREL BISULFATE 75 MG PO TABS: 75.0000 mg | ORAL_TABLET | Freq: Every day | ORAL | Status: DC

## 2014-09-03 NOTE — Telephone Encounter (Signed)
[name], MD at 02/19/2014 7:49 AM  carvedilol (COREG) 12.5 MG tablet Take 12.5 mg by mouth 2 (two) times daily with a meal         Your physician recommends that you continue on your current medications as directed. Please refer to the Current Medication list given to you today

## 2014-09-06 DIAGNOSIS — Z79899 Other long term (current) drug therapy: Secondary | ICD-10-CM | POA: Diagnosis not present

## 2014-09-09 DIAGNOSIS — Z79899 Other long term (current) drug therapy: Secondary | ICD-10-CM | POA: Diagnosis not present

## 2014-09-09 DIAGNOSIS — N189 Chronic kidney disease, unspecified: Secondary | ICD-10-CM | POA: Diagnosis not present

## 2014-09-09 DIAGNOSIS — M069 Rheumatoid arthritis, unspecified: Secondary | ICD-10-CM | POA: Diagnosis not present

## 2014-09-09 DIAGNOSIS — M19041 Primary osteoarthritis, right hand: Secondary | ICD-10-CM | POA: Diagnosis not present

## 2014-09-10 ENCOUNTER — Ambulatory Visit: Payer: Medicare Other | Admitting: Internal Medicine

## 2014-09-11 ENCOUNTER — Encounter: Payer: Self-pay | Admitting: Internal Medicine

## 2014-09-11 ENCOUNTER — Ambulatory Visit (INDEPENDENT_AMBULATORY_CARE_PROVIDER_SITE_OTHER): Payer: Medicare Other | Admitting: Internal Medicine

## 2014-09-11 VITALS — BP 150/80 | HR 68 | Temp 97.8°F | Ht 73.0 in | Wt 226.0 lb

## 2014-09-11 DIAGNOSIS — N183 Chronic kidney disease, stage 3 unspecified: Secondary | ICD-10-CM

## 2014-09-11 DIAGNOSIS — E1149 Type 2 diabetes mellitus with other diabetic neurological complication: Secondary | ICD-10-CM

## 2014-09-11 DIAGNOSIS — I5032 Chronic diastolic (congestive) heart failure: Secondary | ICD-10-CM

## 2014-09-11 DIAGNOSIS — M059 Rheumatoid arthritis with rheumatoid factor, unspecified: Secondary | ICD-10-CM | POA: Diagnosis not present

## 2014-09-11 DIAGNOSIS — E1122 Type 2 diabetes mellitus with diabetic chronic kidney disease: Secondary | ICD-10-CM

## 2014-09-11 DIAGNOSIS — I25119 Atherosclerotic heart disease of native coronary artery with unspecified angina pectoris: Secondary | ICD-10-CM | POA: Diagnosis not present

## 2014-09-11 DIAGNOSIS — J849 Interstitial pulmonary disease, unspecified: Secondary | ICD-10-CM

## 2014-09-11 LAB — HEMOGLOBIN A1C: Hgb A1c MFr Bld: 7.5 % — ABNORMAL HIGH (ref 4.6–6.5)

## 2014-09-11 NOTE — Assessment & Plan Note (Signed)
Gets chest pain along with dyspnea with exertion Not clearly ischemic though Some concern about chronotropic insufficiency--but I would not cut the carvedilol till evaluation with [facility] On statin, ARB, asa and plavix

## 2014-09-11 NOTE — Progress Notes (Signed)
Subjective:    Patient ID: [name], male    DOB: December 16, 1931, 81 y.o.   MRN: [id number]  HPI Here for follow up of diabetes and multiple chronic medical conditions  Keeps up with [name] On azathioprine still--- seems to control synovitis Also using some topical Rx Reviewed labs from her  Ongoing dyspnea Going for second opinion at [facility] in August Still no diagnosis Some chest pain with dyspnea while walking--not felt to be anginal No dizziness or syncope No sig edema Sleeps in recliner and sometimes in bed. Keeps head elevated but no PND. Uses oxygen at night  Checks sugars once or twice a week 150's usually No hypoglycemic reactions Ongoing foot pain and sensory changes Does have diabetic socks  Also saw Dr Henrene Pastor No action for now about the dysphagia  Current Outpatient Prescriptions on File Prior to Visit  Medication Sig Dispense Refill  . amLODipine (NORVASC) 10 MG tablet TAKE 1 TABLET BY MOUTH DAILY 90 tablet 3  . aspirin EC 81 MG tablet Take 81 mg by mouth at bedtime.    [name] azaTHIOprine (IMURAN) 50 MG tablet Take 50 mg by mouth daily.     . carvedilol (COREG) 12.5 MG tablet TAKE 1 TABLET BY MOUTH TWICE A DAY WITH MEALS 60 tablet 4  . clopidogrel (PLAVIX) 75 MG tablet Take 1 tablet (75 mg total) by mouth daily. 30 tablet 0  . DHA-Vitamin C-Lutein (EYE HEALTH FORMULA PO) Take 1 tablet by mouth daily.     . furosemide (LASIX) 40 MG tablet Take 0.5 tablets (20 mg total) by mouth daily.    [name] gabapentin (NEURONTIN) 300 MG capsule TAKE 3 CAPSULES BY MOUTH EACH MORNING AND 4 TO 5 CAPSULES AT BEDTIME 240 capsule 0  . glucose blood (ONE TOUCH TEST STRIPS) test strip Used to test blood sugar once daily dx: 250.60 100 each 1  . isosorbide mononitrate (IMDUR) 30 MG 24 hr tablet TAKE 1 TABLET BY MOUTH DAILY 30 tablet 3  . losartan (COZAAR) 100 MG tablet TAKE 1 TABLET BY MOUTH DAILY 90 tablet 3  . metFORMIN (GLUCOPHAGE) 1000 MG tablet Take 1,000 mg by mouth 2 (two) times  daily with a meal.    . Multiple Vitamin (MULTIVITAMIN WITH MINERALS) TABS tablet Take 1 tablet by mouth daily.    . nitroGLYCERIN (NITROSTAT) 0.4 MG SL tablet Place 0.4 mg under the tongue every 5 (five) minutes as needed for chest pain. Call 911 if not resolved after 3 doses    . omeprazole (PRILOSEC) 40 MG capsule Take one po BID 180 capsule 3  . ONE TOUCH LANCETS MISC Used to test blood sugar once daily dx: 250.60 100 each 1  . pravastatin (PRAVACHOL) 40 MG tablet Take 40 mg by mouth at bedtime.    . vitamin B-12 (CYANOCOBALAMIN) 1000 MCG tablet Take 1,000 mcg by mouth daily.     No current facility-administered medications on file prior to visit.    Allergies  Allergen Reactions  . Doxazosin Mesylate Other (See Comments)    dizziness  . Methocarbamol Rash    Past Medical History  Diagnosis Date  . Hyperlipidemia   . Hypertension   . GERD (gastroesophageal reflux disease)   . Overweight (BMI 25.0-29.9)     BMI 29  . Interstitial lung disease     NOS  . Diverticulosis   . Esophageal stricture     a. s/p dilation spring 2010  . CAD (coronary artery disease)     a.  s/p CABG (2001)  b. s/p DES to RCA and cutting POBA to ostial PDA (2013)   c. s/p DES to SVG to OM2 (01/14/14)  . History of PFTs     mixed pattern on spiro. mild restn on lung volumes with near normal DLCO. Pattern can be explained by CABG scar. Fev1 2.2L/73%, ratio 68 (67), TLC 4.7/68%,RV 1.5L/55%,DLCO 79%  . Colon polyps   . Chronic kidney disease, stage III (moderate)   . Chronic diastolic CHF (congestive heart failure)     a) 09/13 ECHO- LVEF 99991111, grade 1 diastolic dysfunction, mild LA dilatation, atrial septal aneurysm, AV mobility restricted, but no sig AS by doppler; b) 09/04/08 ECHO- LVH, ef 60%, mild AS,  . Heart murmur   . Chronic lower back pain   . Dyspnea 2009 since July -Sept    05/06/08-[facility]-  normal effort, reduced VO2 max 20.5 /65%, reduced at 8.2/ 40%, normal breathing resetvca of 55%, submaximal  heart rate response 112/77%, flattened o2 pluse response at peak exercise-12 ml/beat @ 85%, No VQ mismatch abnormalities, All c/w CIRC Limitation  . Hiatal hernia   . Type II diabetes mellitus   . Arthritis     osteoarthritis, s/p R TKR, and digits  . RA (rheumatoid arthritis)     [name]  . Seropositive rheumatoid arthritis   . Enlarged prostate     Past Surgical History  Procedure Laterality Date  . Total knee arthroplasty Right 03/2010    [name]  . Knee arthroscopy Right 2008  . Cataract extraction w/ intraocular lens  implant, bilateral Bilateral   . Coronary artery bypass graft  11/1999    CABG X5  . Coronary stent placement  02/2012    1 stent and balloon  . Shoulder arthroscopy with open rotator cuff repair and distal clavicle acrominectomy Left 02/27/2013    Procedure: LEFT SHOULDER ARTHROSCOPY WITH MINI OPEN ROTATOR CUFF REPAIR AND SUBACROMIAL DECOMPRESSION AND DISTAL CLAVICLE RESECTION;  Surgeon: [name], MD;  Location: [facility];  Service: Orthopedics;  Laterality: Left;  . Trigger finger release Left 02/27/2013    Procedure: RELEASE TRIGGER FINGER/A-1 PULLEY;  Surgeon: [name], MD;  Location: [place];  Service: Orthopedics;  Laterality: Left;  . Cholecystectomy open  11/2003    [name]  . Joint replacement    . Esophagogastroduodenoscopy (egd) with esophageal dilation  2010  . Cardiac catheterization  08/2004    CP- no MI, Cath- small vessell disease   . Cardiac catheterization  12/31/2011    80% distal LM, 100% native LAD, LCx and RCA, 30% prox SVG-OM, SVG-D1 normal, 99% distal, 80% ostial SVG-RCA distal to graft, LIMA-LAD normal; LVEF mildly decreased with posterior basal AK   . Coronary angioplasty with stent placement  01/03/2012    Successful DES to SVG-RCA and cutting balloon angioplasty ostial  PDA   . Coronary angioplasty with stent placement  01/14/2014    "1"  . Cardiac catheterization  2009    with patent grafts/notes 12/31/2011  . Left  and right heart catheterization with coronary angiogram  12/31/2011    Procedure: LEFT AND RIGHT HEART CATHETERIZATION WITH CORONARY ANGIOGRAM;  Surgeon: Burnell Blanks, MD;  Location: [facility] CATH LAB;  Service: Cardiovascular;;  . Percutaneous coronary stent intervention (pci-s)  12/31/2011    Procedure: PERCUTANEOUS CORONARY STENT INTERVENTION (PCI-S);  Surgeon: Burnell Blanks, MD;  Location: [facility] CATH LAB;  Service: Cardiovascular;;  . Percutaneous coronary stent intervention (pci-s) N/A 01/03/2012    Procedure: PERCUTANEOUS  CORONARY STENT INTERVENTION (PCI-S);  Surgeon: Peter M Martinique, MD;  Location: [facility] CATH LAB;  Service: Cardiovascular;  Laterality: N/A;  . Percutaneous coronary intervention-balloon only  01/03/2012    Procedure: PERCUTANEOUS CORONARY INTERVENTION-BALLOON ONLY;  Surgeon: Peter M Martinique, MD;  Location: [facility] CATH LAB;  Service: Cardiovascular;;  . Left and right heart catheterization with coronary angiogram N/A 01/14/2014    Procedure: LEFT AND RIGHT HEART CATHETERIZATION WITH CORONARY ANGIOGRAM;  Surgeon: Peter M Martinique, MD;  Location: [facility] CATH LAB;  Service: Cardiovascular;  Laterality: N/A;  . Hand surgery      Family History  Problem Relation Age of Onset  . COPD Mother   . Heart disease Father   . Diabetes Brother   . Colon cancer Brother 25  . Alcohol abuse Sister   . Heart attack Father   . Stroke Sister     History   Social History  . Marital Status: Married    Spouse Name: N/A  . Number of Children: 3  . Years of Education: N/A   Occupational History  . lawn mower     retired   Social History Main Topics  . Smoking status: Former Smoker -- 1.00 packs/day for 20 years    Types: Cigarettes    Quit date: 04/06/1963  . Smokeless tobacco: Never Used  . Alcohol Use: No     Comment: 01/01/2012 "last alcohol ~ 50 yr ago"  . Drug Use: No  . Sexual Activity: Not Currently   Other Topics Concern  . Not on file   Social History Narrative   No  living will   Requests wife as health care POA   Discussed DNR --he requests this (done 08/29/12)   Not sure about feeding tube---but might accept for some time   Review of Systems No constipation Appetite is okay Weight is stable    Objective:   Physical Exam  Neck: Normal range of motion. Neck supple. No thyromegaly present.  Cardiovascular: Normal rate, regular rhythm, normal heart sounds and intact distal pulses.  Exam reveals no gallop.   No murmur heard. Pulmonary/Chest: Effort normal. No respiratory distress. He has no wheezes.  Bibasilar dry crackles  Musculoskeletal:  Trace calf edema  Lymphadenopathy:    He has no cervical adenopathy.  Skin:  Slight callous on left foot No ulcers  Psychiatric: He has a normal mood and affect. His behavior is normal.          Assessment & Plan:

## 2014-09-11 NOTE — Assessment & Plan Note (Signed)
Ongoing dyspnea which is not clearly cardiac Trace edema but no evidence of fluid overload

## 2014-09-11 NOTE — Assessment & Plan Note (Signed)
Not sure if this is the cause of dyspnea  Does use oxygen at night

## 2014-09-11 NOTE — Assessment & Plan Note (Signed)
Seems to be controlled with the azathioprine

## 2014-09-11 NOTE — Progress Notes (Signed)
Pre visit review using our clinic review tool, if applicable. No additional management support is needed unless otherwise documented below in the visit note. 

## 2014-09-11 NOTE — Assessment & Plan Note (Signed)
Recent lab reviewed and stable On ARB

## 2014-09-11 NOTE — Assessment & Plan Note (Signed)
Hopefully still good control Due for labs 

## 2014-09-13 ENCOUNTER — Encounter: Payer: Self-pay | Admitting: *Deleted

## 2014-09-14 DIAGNOSIS — R0600 Dyspnea, unspecified: Secondary | ICD-10-CM | POA: Diagnosis not present

## 2014-09-16 DIAGNOSIS — R0689 Other abnormalities of breathing: Secondary | ICD-10-CM | POA: Diagnosis not present

## 2014-09-16 DIAGNOSIS — J8410 Pulmonary fibrosis, unspecified: Secondary | ICD-10-CM | POA: Diagnosis not present

## 2014-09-25 ENCOUNTER — Other Ambulatory Visit: Payer: Self-pay | Admitting: Internal Medicine

## 2014-09-28 ENCOUNTER — Other Ambulatory Visit: Payer: Self-pay | Admitting: Cardiovascular Disease

## 2014-10-09 ENCOUNTER — Other Ambulatory Visit: Payer: Self-pay | Admitting: Cardiovascular Disease

## 2014-10-16 DIAGNOSIS — R0689 Other abnormalities of breathing: Secondary | ICD-10-CM | POA: Diagnosis not present

## 2014-10-16 DIAGNOSIS — J8410 Pulmonary fibrosis, unspecified: Secondary | ICD-10-CM | POA: Diagnosis not present

## 2014-10-30 ENCOUNTER — Other Ambulatory Visit: Payer: Self-pay

## 2014-10-30 ENCOUNTER — Telehealth: Payer: Self-pay

## 2014-10-30 MED ORDER — CLOPIDOGREL BISULFATE 75 MG PO TABS: 75.0000 mg | ORAL_TABLET | Freq: Every day | ORAL | Status: DC

## 2014-10-30 NOTE — Telephone Encounter (Signed)
APPT MADE  FOR  12-30-14  AT  11;15 AM  WITH DR Johnsie Cancel .Adonis Housekeeper

## 2014-10-30 NOTE — Telephone Encounter (Signed)
Received request for clopidogrel 75mg  refill, pt hasn't been seen since 2015, please advise if ok to refill

## 2014-10-30 NOTE — Telephone Encounter (Signed)
Had stent 01/2014 should be on plavix please refill

## 2014-11-04 ENCOUNTER — Telehealth: Payer: Self-pay | Admitting: Internal Medicine

## 2014-11-04 DIAGNOSIS — R0602 Shortness of breath: Secondary | ICD-10-CM | POA: Diagnosis not present

## 2014-11-04 DIAGNOSIS — J849 Interstitial pulmonary disease, unspecified: Secondary | ICD-10-CM | POA: Diagnosis not present

## 2014-11-04 DIAGNOSIS — M0570 Rheumatoid arthritis with rheumatoid factor of unspecified site without organ or systems involvement: Secondary | ICD-10-CM | POA: Diagnosis not present

## 2014-11-04 NOTE — Telephone Encounter (Signed)
Spoke with the pt  He states had PFT done at [facility] and they advised he needs f/u with MR in 2 wks  MR does not have any openings this month  TP does not have any openings in 2 wks  Please advise if we can overbook thanks

## 2014-11-05 DIAGNOSIS — M069 Rheumatoid arthritis, unspecified: Secondary | ICD-10-CM | POA: Insufficient documentation

## 2014-11-06 ENCOUNTER — Other Ambulatory Visit: Payer: Self-pay | Admitting: Internal Medicine

## 2014-11-06 NOTE — Telephone Encounter (Signed)
atc pt to schedule him on 8/18 with TP Line rang several times and asked me to enter remote access code.  wcb.

## 2014-11-06 NOTE — Telephone Encounter (Signed)
There are no openings in 2 weeks. Ok to [employer]? Please advise

## 2014-11-06 NOTE — Telephone Encounter (Signed)
That is fine can book on 8/18 if that works for him on Thursday

## 2014-11-07 ENCOUNTER — Other Ambulatory Visit: Payer: Self-pay | Admitting: Cardiovascular Disease

## 2014-11-07 ENCOUNTER — Other Ambulatory Visit: Payer: Self-pay | Admitting: Internal Medicine

## 2014-11-07 NOTE — Telephone Encounter (Signed)
Called pt and appt scheduled for 8/18 w/ TP. Nothing further needed

## 2014-11-13 DIAGNOSIS — Z79899 Other long term (current) drug therapy: Secondary | ICD-10-CM | POA: Diagnosis not present

## 2014-11-16 DIAGNOSIS — R0689 Other abnormalities of breathing: Secondary | ICD-10-CM | POA: Diagnosis not present

## 2014-11-16 DIAGNOSIS — J8410 Pulmonary fibrosis, unspecified: Secondary | ICD-10-CM | POA: Diagnosis not present

## 2014-11-21 ENCOUNTER — Telehealth: Payer: Self-pay | Admitting: Adult Health

## 2014-11-21 ENCOUNTER — Ambulatory Visit (INDEPENDENT_AMBULATORY_CARE_PROVIDER_SITE_OTHER): Payer: Medicare Other | Admitting: Adult Health

## 2014-11-21 ENCOUNTER — Encounter: Payer: Self-pay | Admitting: Adult Health

## 2014-11-21 VITALS — BP 130/72 | HR 60 | Temp 97.8°F | Ht 73.0 in | Wt 230.0 lb

## 2014-11-21 DIAGNOSIS — J849 Interstitial pulmonary disease, unspecified: Secondary | ICD-10-CM

## 2014-11-21 DIAGNOSIS — J9611 Chronic respiratory failure with hypoxia: Secondary | ICD-10-CM

## 2014-11-21 DIAGNOSIS — J9610 Chronic respiratory failure, unspecified whether with hypoxia or hypercapnia: Secondary | ICD-10-CM | POA: Insufficient documentation

## 2014-11-21 NOTE — Assessment & Plan Note (Signed)
       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 Chase Caller and he will talk with Rheumatology to consider to change to cellcept  Phone message-see

## 2014-11-21 NOTE — Patient Instructions (Signed)
I will discuss case with Dr. Chase Caller and get back with you  follow up Dr. Chase Caller in 6 weeks and As needed

## 2014-11-21 NOTE — Assessment & Plan Note (Signed)
Cont on O2 At bedtime   

## 2014-11-21 NOTE — Progress Notes (Signed)
Subjective:    Patient ID: [name], male    DOB: [birth year], 81 y.o.   MRN: [id number]  HPI  DYSPNEA - since July-Sept 2009  - Coronary artery disease. s/p CABG x 5 in 2002.  Dyspnea started following lopressor Jan-July 2009 and lisinopril increae July 2009. No relief despite stopping agents July 2010  -   Myoview on April 17, 2007 was  nonischemic with an EF of 51% and inferobasal wall scar.   - CAth Oct 2009:    - . Severe native three-vessel coronary artery disease. 2. Status post multivessel coronary bypass surgery with all grafts  patent. Normal LVEF and LVEDP - PFT - Mixed pattern on spiro. Mild restn on lung volumeswith near normal DLCO.   - Fev1 2.2L/73%, FVC 3.21L/70%, Ratio 68 (67),  TLC 4.7L/68%, RV 1.5L/55%, DLCO 79%.  -CPST 05/06/2008: Normal effot.    - Reduced VO2 max 20.5/65%, REduced AT 8.2/40%, Normal breathing reservce of 55%, submaximal heart rate response 112/77%,  flattened O2 pulse response at peak exercise - 55ml/beat at 85%. No VQ mismatch abnormalities. - ECHO 09/04/2008: LVH, ef 60%, mild AS and unchanged from prior   - CT chest   - Non specific Interstitial Lung disease NOS. Stable pulm infilratates RLL ggo > LLL ggo. 03/2008 -> 10/2008 -> June 2011: stable - Rehab: never attended 2009-2013 due to cost   -  reports that he quit smoking about 50 years ago. His smoking use included Cigarettes. He has a 20 pack-year smoking history. He has never used smokeless tobacco.  OV 10/05/2011 Last seen September 22, 2009 and discharged from followup. He never attended rehab due to cost issues. Now reports that on 09/05/11 noticed abrupt dyspnea worsened from class 1 to 3. Also, edema. No chest pain, hemoptysis. So, 09/09/11 went to ER. Trop, BNP normal. No d-didimer. CXR reported as stable but am wondering if there is increased ILD. Given lasix and now edema better and dyspnea better and > 75% towads baseline  Walking desat test : resting 98/94% -> 3 laps x 185 feet; HR 113/pulse  ox 96%   Past, Family, Social reviewed: no change since last visit in 2 year. No major changes to health in 2 years  REC Do not know why exactly you are more short of breath The main difference I think we notice compared to 2 years ago is the slightly more crackling noise in lung but this is not showing up on walking test or xray Best you have breathing test called PFT Return to see me or NP or DR Halford Chessman  for results of PFt in next 4 weeks   OV 11/22/2011 Followup test results: Dyspnea is unchanged since last. IN review of tests: PFTs July 2013 show worsening restriction since 2010 along with reduced diffusion. The RLL baseline GGO might be worse in July 2013 compared to 2011. In addition, autommune profile shows VERY HIGH TITERS of CCP antibody > 300 which is pathognomonic of rheumtoid arthritis lung involvement. RF is only borderline elevated a 23   PFT 10/12/11>>FEV1 2.02 (70%), Fvc 2.8/72%, TLC 4.07 (59%), DLCO 62%, no BD: RESTRICTION - WORSE since 2010 -(2010:  Fev1 2.2L/73%, FVC 3.21L/70%, Ratio 68 (67),  TLC 4.7L/68%, RV 1.5L/55%, DLCO 79%)   Echo 05/04/11>>mild LVH, EF 55 to [id number], grade 1 diastolic dysfx, mild AS, mild MR, PAS 34 mmHg  CT 11/02/11 - calcification suspicious for aortic valve and some GGO esp RLL > LLL (? RLL worse compared to 2011)  Past, Family, Social reviewed: new problem is that  rT flank and RUQ pain. No associated weight loss but has gained. Come and goes. Mild pain. Unclear what aggravating factors and relieving factors. Annoying quality.    Have blood work today related to lung disease  I will talk to Dr Johnsie Cancel  I will call you back after results are in and after talking to Dr Johnsie Cancel  ...  Update - see phone note 11/26/11. I will send it to Dr Johnsie Cancel   Telephone call 8/22/013 hutoimmune test results: RF and CCP antibodies positive. Esp CCP strongly positive. 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 Dyspnea is from this +/- joint pain that he  might be having. He needs immune     Cardiology office note November 2013  He was admitted 9/27-10/1 with chest pain. Myocardial infarction was ruled out. Cardiac catheterization demonstrated high-grade disease in the SVG-RCA as well as ostial PDA distal to the graft. He had some complications from his diagnostic cath with groin hematoma. He was brought back to the Cath Lab several days later for PCI with DES to the [facility] and cutting PTCA to the [place]. Dual antiplatelet therapy recommended for one year.  LHC 12/31/11: dLM 80%, LAD 100%, circumflex 100%, RCA 100%, proximal SVG-OM 30%, SVG-D1 ok, SVG-RCA 99% distal and 80% ostial PDA distal to graft, LIMA-LAD ok with some left to right collaterals.  PCI 01/03/12: Promus DES to the SVG-RCA and Cutting Balloon angioplasty to the ostial PDA.  Echocardiogram 12/31/11: EF 99991111, grade 1 diastolic dysfunction, mild aortic stenosis, mean gradient 9, mild LAE.  Labs (10/13): K 4.5, creatinine 1.29, ALT 14, LDL 69, Hgb 12.5  Since d/c, he is doing well. No chest pain. Dyspnea improved. Probably Class IIb. No syncope. No orthopnea, PND, edema   OV 04/27/2012  [name] returns for followup after 6 months. This is for dyspnea out of proportion to his interstitial lung disease. His interstitial lung disease is on the basis of rheumatoid arthritis. Since I last saw him he has seen rheumatologist  Dr [name], who has started him on Imuran and prednisone. He does be that he took the prednisone only for a short time but he continues on Imuran right now. He feels that this intervention only helped his arthritic symptoms which came to light after the diagnosis of serum antibody positivity rheumatoid arthritis. However it does not help his dyspnea. Then in September 2013 J. chest pain and had cardiac catheterization and status post PCI. He followed up with cardiology in November 2013 and was noted to have improved dyspnea but he tells me now that his dyspnea never  really improved. Currently he is dyspneic for simple things like tying his shoelaces, bending over. It is also dyspneic when he is sitting and doing nothing and he feels like he is to take a big deep breath. Is also dyspneic with exertion and relieved with rest. Severity is moderate. Symptoms are unimproved but stable. There is no associated cough. Of note he went to the emergency room early in January 2014 and was given albuterol as needed which he says helps his symptoms. Labs on 04/07/2047 normal. He is really frustrated by his dyspnea.  So far we have never been able to improve his dyspnea. His rheumatologist is concerned about worsening interstitial lung disease  Of note in the past we've had several discussions about attending pulmonary rehabilitation but due to cost factors he is refused  I screened him about depression  and anxiety and he denies this.  Past, Family, Social reviewed: no change since last visit other than noted as above   rec I understand it is frustrating that they've not been able to improve your shortness of breath despite several interventions and new diagnoses  Please repeat pulmonary function testing and if it shows the pulmonary fibrosis is getting worse I will talk to her rheumatologist about changing some medications  - However this is only meant to prevent further worsening and will not improve things from where they are  In order to improve things from where they are you absolutely need pulmonary rehabilitation and I made a referral to them. We will work with them about reducing cost  also start empiric dulera samples; he will opick this up when he comes for PFT  Followup based on our phone conversation of pulmonary function test results   Ov 05/05/2012  [name] returns acute followup.. After his visit last week he did have pulmonary function test on 04/28/2012 when compared to his pre-Imuran pulmonary function test in July 2013 current pulmonary function test  shows worsening in terms of spirometry with improvement in terms of diffusion capacity. Specifically his FEV1 is now 1.77 L/62% and this is down by 12%. His FVC is 2.62 L/59% and this is decreased by 7%. His total lung capacity is 4 L/59% and this is unchanged. His diffusion capacity is 15.7/71% and this is actually improved by 11%. He is started later a few days ago but this has not made any impact in his symptoms.  However yesterday he developed an acute symptomatology of shortness of breath and some nausea and up in the emergency room. Labs are listed below which have reviewed in the look okay. Emergency room services discharge him on Tamiflu and antibiotics. He is currently feeling better. There no new issues other than chronic worsening of dyspnea  Walking desaturation test on 05/05/2012 185 feet x 3 laps:  did NOT desaturate. Rest pulse ox was 98%, final pulse ox was 96%. HR response 69/min at rest to 82/min at peak exertion.    OV 09/17/2013  Chief Complaint  Patient presents with  . Acute Visit    Pt last seen by MR on 04/2012. Pt c/o dyspnea with any activity. Pt states he has very little cough with grey mucous and CP with deep breaths.    Not seen in the past one year. Continues to have dyspnea on exertion. He is on Imuran although medication list does not have on prednisone anymore. He states dyspnea stable. His rheumatologist sent him back here because of persistent dyspnea and if it is class II to class III on exertion. Last pulmonary function tests, CT scan of the chest and oxygen evaluation was over a year ago. There no new problems. His weight has gained back a few pounds according to his history. This was associated with cough. It is exertional and relieved by rest.   PFT FVC fev1 ratio BD fev1 TLC DLCO comment intervention  2010 3.2L/70% 2.2L/73%   4.7L/68% 79%    10/12/11 2.8L/72% 2.02L/70% 72  4.07L/59% 62% Worsening restriction since 2010 Rheum will start immuran/pred. Also had  PCI in nov 2013  04/28/12 2.6L/59% 1.77L/62% 67  4.0L/59% 15.7/71%     #Shortness of breath and rheumatoid arthritis  - unclear why possibly worse  - High Resolution CT chest without contrast on ILD protocol. Only  Dr Lorin Picket or Dr. Vinnie Langton to read - do full PFT -  do ONO test on room air  - do walk test on room air in office (not 66mwd) now or at followup   #FOllowup  - after above in 4 weeks with me or my NP to regroup   10/15/2013 Follow up  MR pt here for 4 week follow up .   Reports breathing has been doing well, no new complaints.  Will finish Doxycycline 7/14 for [facility] Spotted Fever (was exposed to tick bite)  He has had several tests since last ov , we discussed and reviewed the following.  CT chest showed bronchiectasis w/ no ILD  ONO showed some desats .  Denies daytimes sleepiness or snoring. Previous abg w/ no hypercarbia noted 2013 .  No desats w/ ambulation  PFT showed no significant change with FEV1 56% , ratio 66,  FVC 61%.  Denies hemoptysis , chest pain, orthopnea, edema or fever.   Begin Oxygen 2l/m At bedtime   Trial Symbicort 80 2 puffs Twice daily  , rinse after use.  We are setting you up for a 2 D Echo .  Follow up Dr. Chase Caller in 4 weeks and As needed   Please contact office for sooner follow up if symptoms do not improve or worsen or seek emergency care    OV 11/26/2013  Chief Complaint  Patient presents with  . Follow-up    Pt wearing 2lpm at bedtime. Pt c/o DOE, mild dry cough and chest tightness when SOB. Pt had CT in 09/2013 and echo in 10/2013.     Followup dyspnea multifactorial  - Overnight oxygen test showed desaturations at night. He is using 2 L nasal cannula at night. Despite oxygen use he is not feeling any better. His main complaint is that the slips off . He also tried Symbicort but this did not help and in fact it paradoxically increased bronchoconstriction subjectively and therefore he has stopped this. Pulmonary  function tests does not show any decline  CT scan of the chest does not show any clear cut evidence of residual lung disease other than old bronchiectasis is mild. Echo 10/31/2013: Shows grade 1 diastolic dysfunction and mild aortic stenosis but otherwise unremarkable with a left ventricular ejection fraction 55%. I offered a cardiopulmonary stress test repeat but he wants to talk to his cardiologist before deciding on this.  -  no new issues     OV 03/04/2014  Chief Complaint  Patient presents with  . Follow-up    Pt states his breathing has worsened since last OV. Pt states since he had a stent placement his breathing has worsened, increase in SOB. Pt c/o mild productive cough with light green mucus and chest pain with activity.     Follow-up dyspnea multifactorial in the setting of rheumatoid arthritis, coronary artery disease, obesity   Last visit was in August 2015. I recommended a cardiopulmonary stress test and cardiology evaluation at that point in time. Now he tells me that in October 2015 he had a new cardiac stent placed and this seemed to temporarily improve his dyspnea but then he was started on brilinta which then made his dyspnea worse. Now for the last 2 weeks he's off this drug and is on Plavix instead but still dyspnea has not improved. Currently dyspnea is rated as moderate-severe. It is unchanged from his baseline pre-stent. It is brought on by exertion for 40 yards and relieved by rest. There is associated with chest burning with this which is chronic and long-standing and unchanged. There  is no radiation of the chest pain or cough or wheezing. His quality of life is significantly impacted by dyspnea  He has significant visceral obesity and he has not lost any weight. He says he has tried different diets but says the problem is with the weighing scales     OV 07/09/2014  Chief Complaint  Patient presents with  . Follow-up    Pt stated his breathing is unchanged since  last OV. Pt c/o mild cough with grey mucus production and CP when active.    Follow-up refractory dyspnea which is multifactorial in the setting of rheumatoid arthritis, coronary artery disease, obesity, diastolic dysfunction  Last seen November 2015. At that time I recommended cardiopulmonary stress test. I got clearance from this from his cardiologist but he decided to wait. He returns for follow-up. Dyspnea still persists and is moderate to severe. Associated with chest burning that is chronic and long-standing and unchanged. His quality of life is significantly impacted. He is frustrated. It is not worse however. He is now open to having his cardiopulmonary stress test. I have cautioned him that we might not get much useful information but it'll be a good serial test to give some data    OV 08/28/2014  Chief Complaint  Patient presents with  . Follow-up    Pt here to discuss CPST/dyspnea:Pt stated his breathing is unchanged since last OV. Pt c/o mild cough with grey mucus production and CP when active.     Follow-up dyspnea after cardiopulmonary stress test. Formal interpretation of the cardiopulmonary stress test is pending. This was done 07/24/2014. I personally reviewed the trace. My interpretation shows a submaximal effort with a respiratory exchange ratio of 0.9. However he perceived that he gave an excellent effort. He stopped because of dyspnea and chest pain. He reports these as ongoing problems. He is frustrated that they've not been able to identify a solution for him.  Key findings where a low VO2 max of 11.1 mL per kilogram per minute at 54% associated with a low anaerobic threshold of 33.3%. This was also associated with a high heart rate reserve ordered in other words inadequate heart rate response with a heart rate reserve of 56 bpm. There were no EKG changes. In addition his blood pressure response was flat or hypotensive. Blood pressure at rest was 124/62 with a peak blood  pressure of 108/52. His peak heart rate was 84/m.  There was no ventilatory limitation but his spirometry did show restrictive/obstructive pattern with an FEV1 of 1.6 L/45% and a ratio of 68/92%  Overall the cardia promised stress test at least in terms of increased heart rate reserve was similar to the previous one  11/21/2014 Follow up : ILD ? RA related  Pt returns for follow up  Pt was recently sent to [facility] for evaluation for dyspnea /iLD  Records reviewed from care everywhere , notes from Dr. Deeann Dowse  Indicated that it was felt he most likely had RA related ILD .  CT chest reported to have Lower lboe predominant areas of GG and traction Bronchiectasis .  Was suggested that he might benefit from Cellcept Was indicated his case to be discussed in ILD conference , no notes available on care everywhere.  PFT did show decline in TLC at 55% and FEV1 43%  We reviewed these notes with pt and wife.  He continues to get winded with minimal activity . Has some dry cough  He denies chest pain,orthopnea, increased edema or fever.  Remains on O2 At bedtime    Review of Systems Constitutional:   No  weight loss, night sweats,  Fevers, chills, fatigue, or  lassitude.  HEENT:   No headaches,  Difficulty swallowing,  Tooth/dental problems, or  Sore throat,                No sneezing, itching, ear ache, nasal congestion, post nasal drip,   CV:  No chest pain,  Orthopnea, PND, swelling in lower extremities, anasarca, dizziness, palpitations, syncope.   GI  No heartburn, indigestion, abdominal pain, nausea, vomiting, diarrhea, change in bowel habits, loss of appetite, bloody stools.   Resp: .  No chest wall deformity  Skin: no rash or lesions.  GU: no dysuria, change in color of urine, no urgency or frequency.  No flank pain, no hematuria   MS:  No joint pain or swelling.  No decreased range of motion.  No back pain.  Psych:  No change in mood or affect. No depression or anxiety.  No memory  loss.         Objective:   Physical Exam GEN: A/Ox3; pleasant , NAD, elderly obese   HEENT:  [place]/AT,  EACs-clear, TMs-wnl, NOSE-clear, THROAT-clear, no lesions, no postnasal drip or exudate noted.   NECK:  Supple w/ fair ROM; no JVD; normal carotid impulses w/o bruits; no thyromegaly or nodules palpated; no lymphadenopathy.  RESP  Trace bibasilar crackles no accessory muscle use, no dullness to percussion  CARD:  RRR, no m/r/g  , tr  peripheral edema, pulses intact, no cyanosis or clubbing.  GI:   Soft & nt; nml bowel sounds; no organomegaly or masses detected.  Musco: Warm bil, no deformities or joint swelling noted.   Neuro: alert, no focal deficits noted.    Skin: Warm, no lesions or rashes      Assessment & Plan:

## 2014-11-21 NOTE — Telephone Encounter (Signed)
Seen at [facility] recently with supsected RA r/t ILD  Suggested possible Cellcept due to decline in lung fxn with decreased TLC and FEV1 over last year with more DOE  Dr. Chase Caller will discuss with Rheumatology

## 2014-11-28 NOTE — Telephone Encounter (Signed)
Dr. Chase Caller , were you able to discuss this with [facility] ?

## 2014-12-06 NOTE — Telephone Encounter (Signed)
D/w Dr  Nunzio Cory approx 10d ago over phone - she d/w Dr Dorothyann Peng ni their ILD clinic - recommend increasing immuran or changging to cellcept to help with ILD as a way to reduce dyspnea. D/w Dr [name] 12/06/2014 2:05 PM over phione - rec increasing immuran. Patient weight 100kg.   REC - please call aptient and increase immuran from 50mg  once daily to 50mg  twice daily = cbc,. Bmet, lft in 2 weeks in our lab - ensure he fu with Dr Bronson Curb who will do further titration - fu with me next 1-2 months - at end of this note - please route this to Dr Estanislado Pandy   Dr. Brand Males, M.D., [facility].C.P Pulmonary and Critical Care Medicine Staff Physician [place] [facility] Pager: [phone], If no answer or between  15:00h - 7:00h: call [phone]  12/06/2014 2:07 PM

## 2014-12-06 NOTE — Telephone Encounter (Signed)
Attempted to contact patient, no answer.  Asked for remote access code, could not leave message Will call back.

## 2014-12-11 NOTE — Telephone Encounter (Signed)
Patient notified. Labs ordered. TE forwarded to Dr. [name].

## 2014-12-17 ENCOUNTER — Ambulatory Visit (INDEPENDENT_AMBULATORY_CARE_PROVIDER_SITE_OTHER): Payer: Medicare Other | Admitting: Internal Medicine

## 2014-12-17 ENCOUNTER — Encounter: Payer: Self-pay | Admitting: Internal Medicine

## 2014-12-17 VITALS — BP 130/70 | HR 73 | Temp 97.5°F | Wt 227.0 lb

## 2014-12-17 DIAGNOSIS — R0689 Other abnormalities of breathing: Secondary | ICD-10-CM | POA: Diagnosis not present

## 2014-12-17 DIAGNOSIS — E1142 Type 2 diabetes mellitus with diabetic polyneuropathy: Secondary | ICD-10-CM | POA: Diagnosis not present

## 2014-12-17 DIAGNOSIS — J8410 Pulmonary fibrosis, unspecified: Secondary | ICD-10-CM | POA: Diagnosis not present

## 2014-12-17 MED ORDER — NORTRIPTYLINE HCL 10 MG PO CAPS: 10.0000 mg | ORAL_CAPSULE | Freq: Every day | ORAL | Status: DC

## 2014-12-17 MED ORDER — GABAPENTIN 300 MG PO CAPS: 600.0000 mg | ORAL_CAPSULE | Freq: Two times a day (BID) | ORAL | Status: DC

## 2014-12-17 NOTE — Patient Instructions (Signed)
Please increase the gabapentin to 2 capsules morning and afternoon and 4 at bedtime. Start the nortriptyline tonight with 1 capsule. After a week, you can increase to 2 capsules and then after 2 weeks, to 3 capsules if needed. Watch for morning tiredness, dry mouth, constipation or trouble emptying your bladder.

## 2014-12-17 NOTE — Progress Notes (Signed)
Pre visit review using our clinic review tool, if applicable. No additional management support is needed unless otherwise documented below in the visit note. 

## 2014-12-17 NOTE — Assessment & Plan Note (Signed)
Ongoing life altering symptoms Will increase the gabapentin to 600/600/1200 Add small doses of nortriptyline

## 2014-12-17 NOTE — Progress Notes (Signed)
Subjective:    Patient ID: [name], male    DOB: Feb 16, 1932, 81 y.o.   MRN: [id number]  HPI Having worsened problems with the foot pain Same burning and pain from neuropathy Taking 900/1200 of the gabapentin Sleeps okay at night Some sleepiness from the morning dose Hard to tell if this is helping  Current Outpatient Prescriptions on File Prior to Visit  Medication Sig Dispense Refill  . amLODipine (NORVASC) 10 MG tablet TAKE 1 TABLET BY MOUTH DAILY 90 tablet 3  . aspirin EC 81 MG tablet Take 81 mg by mouth at bedtime.    . carvedilol (COREG) 12.5 MG tablet TAKE 1 TABLET BY MOUTH TWICE A DAY WITH MEALS 60 tablet 4  . clopidogrel (PLAVIX) 75 MG tablet Take 1 tablet (75 mg total) by mouth daily. 30 tablet 3  . DHA-Vitamin C-Lutein (EYE HEALTH FORMULA PO) Take 1 tablet by mouth daily.     . furosemide (LASIX) 40 MG tablet TAKE 1 TO 2 TABLETS BY MOUTH EVERY DAY. 60 tablet 1  . gabapentin (NEURONTIN) 300 MG capsule TAKE 3 CAPSULES BY MOUTH EACH MORNING & 4 TO 5 CAPSULES AT BEDTIME 240 capsule 3  . glucose blood (ONE TOUCH TEST STRIPS) test strip Used to test blood sugar once daily dx: 250.60 100 each 1  . isosorbide mononitrate (IMDUR) 30 MG 24 hr tablet TAKE 1 TABLET BY MOUTH DAILY 30 tablet 0  . losartan (COZAAR) 100 MG tablet TAKE 1 TABLET BY MOUTH DAILY 90 tablet 3  . metFORMIN (GLUCOPHAGE) 1000 MG tablet Take 1,000 mg by mouth 2 (two) times daily with a meal.    . Multiple Vitamin (MULTIVITAMIN WITH MINERALS) TABS tablet Take 1 tablet by mouth daily.    . nitroGLYCERIN (NITROSTAT) 0.4 MG SL tablet Place 0.4 mg under the tongue every 5 (five) minutes as needed for chest pain. Call 911 if not resolved after 3 doses    . omeprazole (PRILOSEC) 40 MG capsule Take one po BID 180 capsule 3  . ONE TOUCH LANCETS MISC Used to test blood sugar once daily dx: 250.60 100 each 1  . pravastatin (PRAVACHOL) 40 MG tablet TAKE 1 TABLET BY MOUTH DAILY 90 tablet 3   No current  facility-administered medications on file prior to visit.    Allergies  Allergen Reactions  . Doxazosin Mesylate Other (See Comments)    dizziness  . Methocarbamol Rash    Past Medical History  Diagnosis Date  . Hyperlipidemia   . Hypertension   . GERD (gastroesophageal reflux disease)   . Overweight (BMI 25.0-29.9)     BMI 29  . Interstitial lung disease     NOS  . Diverticulosis   . Esophageal stricture     a. s/p dilation spring 2010  . CAD (coronary artery disease)     a. s/p CABG (2001)  b. s/p DES to RCA and cutting POBA to ostial PDA (2013)   c. s/p DES to SVG to OM2 (01/14/14)  . History of PFTs     mixed pattern on spiro. mild restn on lung volumes with near normal DLCO. Pattern can be explained by CABG scar. Fev1 2.2L/73%, ratio 68 (67), TLC 4.7/68%,RV 1.5L/55%,DLCO 79%  . Colon polyps   . Chronic kidney disease, stage III (moderate)   . Chronic diastolic CHF (congestive heart failure)     a) 09/13 ECHO- LVEF 99991111, grade 1 diastolic dysfunction, mild LA dilatation, atrial septal aneurysm, AV mobility restricted, but no sig AS by  doppler; b) 09/04/08 ECHO- LVH, ef 60%, mild AS,  . Heart murmur   . Chronic lower back pain   . Dyspnea 2009 since July -Sept    05/06/08-[facility]-  normal effort, reduced VO2 max 20.5 /65%, reduced at 8.2/ 40%, normal breathing resetvca of 55%, submaximal heart rate response 112/77%, flattened o2 pluse response at peak exercise-12 ml/beat @ 85%, No VQ mismatch abnormalities, All c/w CIRC Limitation  . Hiatal hernia   . Type II diabetes mellitus   . Arthritis     osteoarthritis, s/p R TKR, and digits  . RA (rheumatoid arthritis)     [name]  . Seropositive rheumatoid arthritis   . Enlarged prostate     Past Surgical History  Procedure Laterality Date  . Total knee arthroplasty Right 03/2010    [name]  . Knee arthroscopy Right 2008  . Cataract extraction w/ intraocular lens  implant, bilateral Bilateral   . Coronary artery bypass  graft  11/1999    CABG X5  . Coronary stent placement  02/2012    1 stent and balloon  . Shoulder arthroscopy with open rotator cuff repair and distal clavicle acrominectomy Left 02/27/2013    Procedure: LEFT SHOULDER ARTHROSCOPY WITH MINI OPEN ROTATOR CUFF REPAIR AND SUBACROMIAL DECOMPRESSION AND DISTAL CLAVICLE RESECTION;  Surgeon: [name], MD;  Location: [name];  Service: Orthopedics;  Laterality: Left;  . Trigger finger release Left 02/27/2013    Procedure: RELEASE TRIGGER FINGER/A-1 PULLEY;  Surgeon: [name], MD;  Location: [place];  Service: Orthopedics;  Laterality: Left;  . Cholecystectomy open  11/2003    [name]  . Joint replacement    . Esophagogastroduodenoscopy (egd) with esophageal dilation  2010  . Cardiac catheterization  08/2004    CP- no MI, Cath- small vessell disease   . Cardiac catheterization  12/31/2011    80% distal LM, 100% native LAD, LCx and RCA, 30% prox SVG-OM, SVG-D1 normal, 99% distal, 80% ostial SVG-RCA distal to graft, LIMA-LAD normal; LVEF mildly decreased with posterior basal AK   . Coronary angioplasty with stent placement  01/03/2012    Successful DES to SVG-RCA and cutting balloon angioplasty ostial  PDA   . Coronary angioplasty with stent placement  01/14/2014    "1"  . Cardiac catheterization  2009    with patent grafts/notes 12/31/2011  . Left and right heart catheterization with coronary angiogram  12/31/2011    Procedure: LEFT AND RIGHT HEART CATHETERIZATION WITH CORONARY ANGIOGRAM;  Surgeon: Burnell Blanks, MD;  Location: [facility] CATH LAB;  Service: Cardiovascular;;  . Percutaneous coronary stent intervention (pci-s)  12/31/2011    Procedure: PERCUTANEOUS CORONARY STENT INTERVENTION (PCI-S);  Surgeon: Burnell Blanks, MD;  Location: [facility] CATH LAB;  Service: Cardiovascular;;  . Percutaneous coronary stent intervention (pci-s) N/A 01/03/2012    Procedure: PERCUTANEOUS CORONARY STENT INTERVENTION (PCI-S);  Surgeon: Peter M Martinique,  MD;  Location: [facility] CATH LAB;  Service: Cardiovascular;  Laterality: N/A;  . Percutaneous coronary intervention-balloon only  01/03/2012    Procedure: PERCUTANEOUS CORONARY INTERVENTION-BALLOON ONLY;  Surgeon: Peter M Martinique, MD;  Location: [facility] CATH LAB;  Service: Cardiovascular;;  . Left and right heart catheterization with coronary angiogram N/A 01/14/2014    Procedure: LEFT AND RIGHT HEART CATHETERIZATION WITH CORONARY ANGIOGRAM;  Surgeon: Peter M Martinique, MD;  Location: [facility] CATH LAB;  Service: Cardiovascular;  Laterality: N/A;  . Hand surgery      Family History  Problem Relation Age of Onset  . COPD Mother   .  Heart disease Father   . Diabetes Brother   . Colon cancer Brother 25  . Alcohol abuse Sister   . Heart attack Father   . Stroke Sister     Social History   Social History  . Marital Status: Married    Spouse Name: N/A  . Number of Children: 3  . Years of Education: N/A   Occupational History  . lawn mower     retired   Social History Main Topics  . Smoking status: Former Smoker -- 1.00 packs/day for 20 years    Types: Cigarettes    Quit date: 04/06/1963  . Smokeless tobacco: Never Used  . Alcohol Use: No     Comment: 01/01/2012 "last alcohol ~ 50 yr ago"  . Drug Use: No  . Sexual Activity: Not Currently   Other Topics Concern  . Not on file   Social History Narrative   No living will   Requests wife as health care POA   Discussed DNR --he requests this (done 08/29/12)   Not sure about feeding tube---but might accept for some time    Review of Systems Got second opinion about lung disease at [facility] Increased imuran seems to have helped    Objective:   Physical Exam        Assessment & Plan:

## 2014-12-25 ENCOUNTER — Other Ambulatory Visit (INDEPENDENT_AMBULATORY_CARE_PROVIDER_SITE_OTHER): Payer: Medicare Other

## 2014-12-25 ENCOUNTER — Encounter: Payer: Self-pay | Admitting: *Deleted

## 2014-12-25 DIAGNOSIS — J849 Interstitial pulmonary disease, unspecified: Secondary | ICD-10-CM

## 2014-12-25 LAB — BASIC METABOLIC PANEL
BUN: 24 mg/dL — AB (ref 6–23)
CO2: 34 mEq/L — ABNORMAL HIGH (ref 19–32)
CREATININE: 1.53 mg/dL — AB (ref 0.40–1.50)
Calcium: 9.9 mg/dL (ref 8.4–10.5)
Chloride: 101 mEq/L (ref 96–112)
GFR: 46.62 mL/min — ABNORMAL LOW (ref >60.00)
Glucose, Bld: 152 mg/dL — ABNORMAL HIGH (ref 70–99)
Potassium: 4.1 mEq/L (ref 3.5–5.1)
Sodium: 142 mEq/L (ref 135–145)

## 2014-12-25 LAB — HEPATIC FUNCTION PANEL
ALT: 14 U/L (ref 0–53)
AST: 19 U/L (ref 0–37)
Albumin: 4 g/dL (ref 3.5–5.2)
Alkaline Phosphatase: 49 U/L (ref 39–117)
BILIRUBIN TOTAL: 0.5 mg/dL (ref 0.2–1.2)
Bilirubin, Direct: 0.1 mg/dL (ref 0.0–0.3)
TOTAL PROTEIN: 8.3 g/dL (ref 6.0–8.3)

## 2014-12-25 LAB — CBC WITH DIFFERENTIAL/PLATELET
BASOS ABS: 0 10*3/uL (ref 0.0–0.1)
Basophils Relative: 0.4 % (ref 0.0–3.0)
EOS PCT: 6.3 % — AB (ref 0.0–5.0)
Eosinophils Absolute: 0.5 10*3/uL (ref 0.0–0.7)
HEMATOCRIT: 38.9 % — AB (ref 39.0–52.0)
Hemoglobin: 12.9 g/dL — ABNORMAL LOW (ref 13.0–17.0)
LYMPHS PCT: 26.7 % (ref 12.0–46.0)
Lymphs Abs: 2.1 10*3/uL (ref 0.7–4.0)
MCHC: 33.3 g/dL (ref 30.0–36.0)
MCV: 83.1 fl (ref 78.0–100.0)
MONOS PCT: 7.4 % (ref 3.0–12.0)
Monocytes Absolute: 0.6 10*3/uL (ref 0.1–1.0)
NEUTROS ABS: 4.6 10*3/uL (ref 1.4–7.7)
Neutrophils Relative %: 59.2 % (ref 43.0–77.0)
PLATELETS: 211 10*3/uL (ref 150.0–400.0)
RBC: 4.68 Mil/uL (ref 4.22–5.81)
RDW: 15.8 % — ABNORMAL HIGH (ref 11.5–15.5)
WBC: 7.7 10*3/uL (ref 4.0–10.5)

## 2014-12-27 ENCOUNTER — Telehealth: Payer: Self-pay | Admitting: Internal Medicine

## 2014-12-27 DIAGNOSIS — J849 Interstitial pulmonary disease, unspecified: Secondary | ICD-10-CM

## 2014-12-27 NOTE — Telephone Encounter (Signed)
[phone] calling back

## 2014-12-27 NOTE — Telephone Encounter (Signed)
Called and spoke with pt.  Reviewed results and recs Pt voiced understanding and had no further questions Labs were faxed to.Dr [name]   Order was placed for BMET, LFT and CBC  Pt has ov with MR on 01/07/15 Nothing further needed

## 2014-12-27 NOTE — Telephone Encounter (Signed)
lmtcb for pt.  

## 2014-12-27 NOTE — Telephone Encounter (Signed)
  Let [name] know that CBC, BMET, LFT are stable. Send this note to Dr [name] after informing patient   Plan Repeat cbc bmet lft in 1 month   Thanks  Dr. Brand Males, M.D., [facility].C.P Pulmonary and Critical Care Medicine Staff Physician [place] [facility] Critical [facility] Pager: [phone], If no answer or between  15:00h - 7:00h: call [phone]  12/27/2014 8:05 AM      PULMONARY No results for input(s): PHART, PCO2ART, PO2ART, HCO3, TCO2, O2SAT in the last 168 hours.  Invalid input(s): PCO2, PO2  CBC  Recent Labs Lab 12/25/14 1013  HGB 12.9*  HCT 38.9*  WBC 7.7  PLT 211.0    COAGULATION No results for input(s): INR in the last 168 hours.  CARDIAC  No results for input(s): TROPONINI in the last 168 hours. No results for input(s): PROBNP in the last 168 hours.   CHEMISTRY  Recent Labs Lab 12/25/14 1013  NA 142  K 4.1  CL 101  CO2 34*  GLUCOSE 152*  BUN 24*  CREATININE 1.53*  CALCIUM 9.9   Estimated Creatinine Clearance: 47.7 mL/min (by C-G formula based on Cr of 1.53).   LIVER  Recent Labs Lab 12/25/14 1013  AST 19  ALT 14  ALKPHOS 49  BILITOT 0.5  PROT 8.3  ALBUMIN 4.0     INFECTIOUS No results for input(s): LATICACIDVEN, PROCALCITON in the last 168 hours.   ENDOCRINE CBG (last 3)  No results for input(s): GLUCAP in the last 72 hours.       IMAGING x48h  - image(s) personally visualized  -   highlighted in bold No results found.

## 2014-12-29 NOTE — Progress Notes (Signed)
Patient ID: [name], male   DOB: 17-Jun-1931, 81 y.o.   MRN: [id number] [name] is a 81 y.o. . male with a hx of CAD: s/p CABG (2001): s/p DES to RCA and cutting POBA to ostial PDA (2013), interstitial lung disease, mild aortic stenosis, HTN, HL, DM2, CKD, rheumatoid arthritis and chronic dyspnea who was  admitted to [facility] on 01/14/14 for planned LHC after an abnormal Lexiscan myoview. He underwent successful placement of DES to SVG to OM2.  Studies: - LHC (01/14/14): Svr 3V CAD, patent LIMA to LAD, patent SVG to OM2 but mod-sev stent of prox SVG, new occluded SVG to Diag, new occluded SVG to RCA. S/p DES to SVG to OM2  - Echo (10/2013): Mild LVH, EF 60-65%, normal wall motion, grade 1 diastolic dysfunction, mild aortic stenosis (mean 14 mm Hg), normal RV function  - Nuclear (01/08/14): High risk stress nuclear study with two perfusion defects: 1. Medium size severe severity ischemia in the mid LAD territory (SDS 8) and 2. Large scar in the entire inferior, and basal and mid inferolateral walls. [name]   Dyspnea improved off Brillinta and taking plavix now  Has clinical ILD and seeing pulmonary  Imuran increased for arthritis after seeing rheumatology at [facility] and has cured 99% of his chest pain according to patient   ROS: Denies fever, malais, weight loss, blurry vision, decreased visual acuity, cough, sputum, SOB, hemoptysis, pleuritic pain, palpitaitons, heartburn, abdominal pain, melena, lower extremity edema, claudication, or rash.  All other systems reviewed and negative  General: Affect appropriate Healthy:  appears stated age 81: normal Neck supple with no adenopathy JVP normal no bruits no thyromegaly Lungs decreased BS right base no wheezing and good diaphragmatic motion Heart:  S1/S2 mild AS  murmur, no rub, gallop or click PMI normal Abdomen: benighn, BS positve, no tenderness, no AAA no bruit.  No HSM or HJR Distal pulses intact with no bruits No edema Neuro  non-focal Skin warm and dry No muscular weakness   Current Outpatient Prescriptions  Medication Sig Dispense Refill  . amLODipine (NORVASC) 10 MG tablet TAKE 1 TABLET BY MOUTH DAILY 90 tablet 3  . aspirin EC 81 MG tablet Take 81 mg by mouth at bedtime.    [name] azaTHIOprine (IMURAN) 50 MG tablet Take 100 mg by mouth daily.    . carvedilol (COREG) 12.5 MG tablet TAKE 1 TABLET BY MOUTH TWICE A DAY WITH MEALS 60 tablet 4  . clopidogrel (PLAVIX) 75 MG tablet Take 1 tablet (75 mg total) by mouth daily. 30 tablet 3  . DHA-Vitamin C-Lutein (EYE HEALTH FORMULA PO) Take 1 tablet by mouth daily.     . furosemide (LASIX) 40 MG tablet TAKE 1 TO 2 TABLETS BY MOUTH EVERY DAY. 60 tablet 1  . gabapentin (NEURONTIN) 300 MG capsule Take 600 mg by mouth 2 (two) times daily. TAKE 4 TABLETS BY MOUTH AT BEDTIME.    [name] glucose blood (ONE TOUCH TEST STRIPS) test strip Used to test blood sugar once daily dx: 250.60 100 each 1  . isosorbide mononitrate (IMDUR) 30 MG 24 hr tablet TAKE 1 TABLET BY MOUTH DAILY 30 tablet 0  . losartan (COZAAR) 100 MG tablet TAKE 1 TABLET BY MOUTH DAILY 90 tablet 3  . metFORMIN (GLUCOPHAGE) 1000 MG tablet Take 1,000 mg by mouth 2 (two) times daily with a meal.    . Multiple Vitamin (MULTIVITAMIN WITH MINERALS) TABS tablet Take 1 tablet by mouth daily.    . nitroGLYCERIN (NITROSTAT)  0.4 MG SL tablet Place 0.4 mg under the tongue every 5 (five) minutes as needed for chest pain. Call 911 if not resolved after 3 doses    . nortriptyline (PAMELOR) 10 MG capsule Take 1-3 capsules (10-30 mg total) by mouth at bedtime. 90 capsule 11  . omeprazole (PRILOSEC) 40 MG capsule Take 40 mg by mouth daily.    . ONE TOUCH LANCETS MISC Used to test blood sugar once daily dx: 250.60 100 each 1  . pravastatin (PRAVACHOL) 40 MG tablet TAKE 1 TABLET BY MOUTH DAILY 90 tablet 3   No current facility-administered medications for this visit.    Allergies  Doxazosin mesylate and Methocarbamol  Electrocardiogram:   01/22/14 SR rate 79 old IMI with T wave inversions  12/30/14  SR rate 64  Old IMI    Assessment and Plan [id number] 2001.  DES to native RCA 2013  LHC (01/14/14): Severe  3V CAD, patent LIMA to LAD, patent SVG to OM2 but mod-sev stent thrombosis  of prox SVG, new occluded SVG to Diag, new occluded SVG to RCA. S/p DES to SVG to OM2  Continue medical Rx and DAT Interesting increasing his Imuran for arthritis has helped his chest pain more than anything  DM: Discussed low carb diet.  Target hemoglobin A1c is 6.5 or less.  Continue current medications. HTN: Well controlled.  Continue current medications and low sodium Dash type diet.   Chol:  Lab Results  Component Value Date   LDLCALC 103* 03/12/2014   Dyspnea: More related to ILD than heart abnormal basilar lung exam f/u pulmonary  AS:  Mild by echo no change in murmur echo in a year  [employer]

## 2014-12-30 ENCOUNTER — Ambulatory Visit (INDEPENDENT_AMBULATORY_CARE_PROVIDER_SITE_OTHER): Payer: Medicare Other | Admitting: Cardiovascular Disease

## 2014-12-30 ENCOUNTER — Encounter: Payer: Self-pay | Admitting: Cardiovascular Disease

## 2014-12-30 VITALS — BP 146/68 | HR 69 | Ht 73.0 in | Wt 227.1 lb

## 2014-12-30 DIAGNOSIS — E785 Hyperlipidemia, unspecified: Secondary | ICD-10-CM

## 2014-12-30 DIAGNOSIS — I10 Essential (primary) hypertension: Secondary | ICD-10-CM | POA: Diagnosis not present

## 2014-12-30 NOTE — Patient Instructions (Signed)
Medication Instructions:  Your physician recommends that you continue on your current medications as directed. Please refer to the Current Medication list given to you today.   Labwork: NONE  Testing/Procedures: NONE  Follow-Up: Your physician wants you to follow-up in: 6 MONTHS  WITH  DR NISHAN  You will receive a reminder letter in the mail two months in advance. If you don't receive a letter, please call our office to schedule the follow-up appointment.  Any Other Special Instructions Will Be Listed Below (If Applicable).   

## 2015-01-07 ENCOUNTER — Ambulatory Visit (INDEPENDENT_AMBULATORY_CARE_PROVIDER_SITE_OTHER): Payer: Medicare Other | Admitting: Internal Medicine

## 2015-01-07 ENCOUNTER — Other Ambulatory Visit: Payer: Self-pay | Admitting: Internal Medicine

## 2015-01-07 ENCOUNTER — Encounter: Payer: Self-pay | Admitting: Internal Medicine

## 2015-01-07 VITALS — BP 140/80 | HR 77 | Ht 73.0 in | Wt 228.8 lb

## 2015-01-07 DIAGNOSIS — I00 Rheumatic fever without heart involvement: Secondary | ICD-10-CM | POA: Diagnosis not present

## 2015-01-07 DIAGNOSIS — J849 Interstitial pulmonary disease, unspecified: Secondary | ICD-10-CM

## 2015-01-07 DIAGNOSIS — R0600 Dyspnea, unspecified: Secondary | ICD-10-CM | POA: Diagnosis not present

## 2015-01-07 DIAGNOSIS — M0520 Rheumatoid vasculitis with rheumatoid arthritis of unspecified site: Secondary | ICD-10-CM

## 2015-01-07 DIAGNOSIS — Z79899 Other long term (current) drug therapy: Secondary | ICD-10-CM | POA: Diagnosis not present

## 2015-01-07 NOTE — Patient Instructions (Signed)
ICD-9-CM ICD-10-CM   1. ILD (interstitial lung disease) ([place]) 515 J84.9   2. Dyspnea 786.09 R06.00   3. Rheumatoid arteritis 447.8 I00    Glad pain in chest better after up-titration of immuran Further immuran dosing and monitoring with Dr Estanislado Pandy Too bad that your shortness of breath still not better   - Refer [facility] pulmonary rehab to see if it can help shortness of breath  - when safe let us see if Dr Estanislado Pandy can increase immuran further and if that would help  Followup  6 months or sooner if needed

## 2015-01-07 NOTE — Progress Notes (Signed)
Subjective:     Patient ID: [name], male   DOB: 04-20-1931, 81 y.o.   MRN: [id number]  HPI   OV 01/07/2015  Chief Complaint  Patient presents with  . Follow-up    Pt states his breathing is unchanged since last OV. Pt c/o prod cough with green mucus. Pt denies CP/tightness.     Follow-up multifactorial dyspnea. Cardiopulmonary stress test showed submaximal exertion. He also has rheumatoid arthritis with ILD. Last visit I referred him to [facility] for his dyspnea. They returned back saying that possibly treating his ILD with increased Imuran would be the way to improve his dyspnea. Therefore after talking to his local rheumatologist Dr. Keturah Barre I increased Imuran. Further titration of urine will be done by Dr. Keturah Barre. He says with this and his wife who is here with him for the first time also test the same that the chest tightness and the pain that he was having with exertion has improved. Therefore retrospect this chest pain was due to rheumatoid arthritis. However dyspnea and fatigue are unchanged. He is reluctantly willing to undergo pulmonary rehabilitation. There are no other new issues   Current outpatient prescriptions:  .  amLODipine (NORVASC) 10 MG tablet, TAKE 1 TABLET BY MOUTH DAILY, Disp: 90 tablet, Rfl: 3 .  aspirin EC 81 MG tablet, Take 81 mg by mouth at bedtime., Disp: , Rfl:  .  azaTHIOprine (IMURAN) 50 MG tablet, Take 100 mg by mouth 2 (two) times daily. , Disp: , Rfl:  .  carvedilol (COREG) 12.5 MG tablet, TAKE 1 TABLET BY MOUTH TWICE A DAY WITH MEALS, Disp: 60 tablet, Rfl: 4 .  clopidogrel (PLAVIX) 75 MG tablet, Take 1 tablet (75 mg total) by mouth daily., Disp: 30 tablet, Rfl: 3 .  DHA-Vitamin C-Lutein (EYE HEALTH FORMULA PO), Take 1 tablet by mouth daily. , Disp: , Rfl:  .  furosemide (LASIX) 40 MG tablet, TAKE ONE OR TWO TABLETS BY MOUTH EVERY DAY., Disp: 60 tablet, Rfl: 11 .  gabapentin (NEURONTIN) 300 MG capsule, Take 600 mg by mouth 2 (two) times  daily. Pt states he is taking 2 caps in AM, 2 caps at lunch, 2 caps in PM, Disp: , Rfl:  .  glucose blood (ONE TOUCH TEST STRIPS) test strip, Used to test blood sugar once daily dx: 250.60, Disp: 100 each, Rfl: 1 .  isosorbide mononitrate (IMDUR) 30 MG 24 hr tablet, TAKE 1 TABLET BY MOUTH DAILY, Disp: 30 tablet, Rfl: 0 .  losartan (COZAAR) 100 MG tablet, TAKE 1 TABLET BY MOUTH DAILY, Disp: 90 tablet, Rfl: 3 .  metFORMIN (GLUCOPHAGE) 1000 MG tablet, Take 1,000 mg by mouth 2 (two) times daily with a meal., Disp: , Rfl:  .  nortriptyline (PAMELOR) 10 MG capsule, Take 1-3 capsules (10-30 mg total) by mouth at bedtime., Disp: 90 capsule, Rfl: 11 .  omeprazole (PRILOSEC) 40 MG capsule, Take 40 mg by mouth daily., Disp: , Rfl:  .  ONE TOUCH LANCETS MISC, Used to test blood sugar once daily dx: 250.60, Disp: 100 each, Rfl: 1 .  pravastatin (PRAVACHOL) 40 MG tablet, TAKE 1 TABLET BY MOUTH DAILY, Disp: 90 tablet, Rfl: 3 .  Multiple Vitamin (MULTIVITAMIN WITH MINERALS) TABS tablet, Take 1 tablet by mouth daily., Disp: , Rfl:  .  nitroGLYCERIN (NITROSTAT) 0.4 MG SL tablet, Place 0.4 mg under the tongue every 5 (five) minutes as needed for chest pain. Call 911 if not resolved after 3 doses, Disp: , Rfl:  Immunization History  Administered Date(s) Administered  . H1N1 03/27/2008  . Influenza Split 12/29/2010, 01/18/2012  . Influenza Whole 02/03/2007, 01/09/2008, 01/01/2009, 12/31/2009  . Influenza,inj,Quad PF,36+ Mos 12/13/2012, 01/15/2014  . Pneumococcal Conjugate-13 09/10/2013  . Pneumococcal Polysaccharide-23 03/05/1997, 08/17/2011  . Td 03/05/1997, 08/17/2011      Review of Systems  Positive for hernia, shortness of breath, fatigue and improved chest pain. Otherwise 11 point review systems is negative or per history of present illness     Objective:   Physical Exam  Constitutional: He is oriented to person, place, and time. He appears well-developed and well-nourished. No distress.   -----------------------------------                   01/07/15                               1403             -----------------------------------  BP:               140/80             Pulse:              77               Height:       6\' 1"  (1.854 m)        Weight: 228 lb 12.8 oz (103.783 kg)  SpO2:               95%             -----------------------------------  Body mass index is 30.19 kg/(m^2).   HENT:  Head: Normocephalic and atraumatic.  Right Ear: External ear normal.  Left Ear: External ear normal.  Mouth/Throat: Oropharynx is clear and moist. No oropharyngeal exudate.  Eyes: Conjunctivae and EOM are normal. Pupils are equal, round, and reactive to light. Right eye exhibits no discharge. Left eye exhibits no discharge. No scleral icterus.  Neck: Normal range of motion. Neck supple. No JVD present. No tracheal deviation present. No thyromegaly present.  Cardiovascular: Normal rate, regular rhythm and intact distal pulses.  Exam reveals no gallop and no friction rub.   No murmur heard. Pulmonary/Chest: Effort normal. No respiratory distress. He has no wheezes. He has rales. He exhibits no tenderness.  Abdominal: Soft. Bowel sounds are normal. He exhibits no distension and no mass. There is no tenderness. There is no rebound and no guarding.  Musculoskeletal: Normal range of motion. He exhibits no edema or tenderness.  Lymphadenopathy:    He has no cervical adenopathy.  Neurological: He is alert and oriented to person, place, and time. He has normal reflexes. No cranial nerve deficit. Coordination normal.  Skin: Skin is warm and dry. No rash noted. He is not diaphoretic. No erythema. No pallor.  Psychiatric: He has a normal mood and affect. His behavior is normal. Judgment and thought content normal.  Nursing note and vitals reviewed.   Filed Vitals:   01/07/15 1403  BP: 140/80  Pulse: 77  Height: 6\' 1"  (1.854 m)  Weight: 228 lb 12.8 oz  (103.783 kg)  SpO2: 95%        Assessment:       ICD-9-CM ICD-10-CM   1. ILD (interstitial lung disease) ([place]) 515 J84.9   2. Dyspnea 786.09 R06.00   3. 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 Brand Males, M.D., [facility].C.P Pulmonary and Critical Care Medicine Staff Physician [place] [facility] and Critical Care Pager: [phone], If no answer or between  15:00h - 7:00h: call [phone]  01/07/2015 2:28 PM

## 2015-01-13 DIAGNOSIS — M79671 Pain in right foot: Secondary | ICD-10-CM | POA: Diagnosis not present

## 2015-01-13 DIAGNOSIS — M79672 Pain in left foot: Secondary | ICD-10-CM | POA: Diagnosis not present

## 2015-01-13 DIAGNOSIS — M0579 Rheumatoid arthritis with rheumatoid factor of multiple sites without organ or systems involvement: Secondary | ICD-10-CM | POA: Diagnosis not present

## 2015-01-13 DIAGNOSIS — M79641 Pain in right hand: Secondary | ICD-10-CM | POA: Diagnosis not present

## 2015-01-13 DIAGNOSIS — Z79899 Other long term (current) drug therapy: Secondary | ICD-10-CM | POA: Diagnosis not present

## 2015-01-13 DIAGNOSIS — M79642 Pain in left hand: Secondary | ICD-10-CM | POA: Diagnosis not present

## 2015-01-15 ENCOUNTER — Ambulatory Visit (INDEPENDENT_AMBULATORY_CARE_PROVIDER_SITE_OTHER): Payer: Medicare Other | Admitting: Internal Medicine

## 2015-01-15 ENCOUNTER — Encounter: Payer: Self-pay | Admitting: Internal Medicine

## 2015-01-15 VITALS — BP 122/72 | HR 78 | Temp 97.5°F | Wt 224.2 lb

## 2015-01-15 DIAGNOSIS — Z23 Encounter for immunization: Secondary | ICD-10-CM

## 2015-01-15 DIAGNOSIS — E1142 Type 2 diabetes mellitus with diabetic polyneuropathy: Secondary | ICD-10-CM

## 2015-01-15 MED ORDER — DULOXETINE HCL 30 MG PO CPEP
30.0000 mg | ORAL_CAPSULE | Freq: Every day | ORAL | Status: DC
Start: 2015-01-15 — End: 2015-01-29

## 2015-01-15 NOTE — Addendum Note (Signed)
Addended by: Emelia Salisbury C on: 01/15/2015 01:01 PM   Modules accepted: Orders

## 2015-01-15 NOTE — Patient Instructions (Signed)
Please stop the nortriptyline. Start the duloxetine 30mg  daily. Let me know immediately if you have any problems. If you are tolerating it, let me know in 2-3 weeks if it is not helping the burning at all. I would then increase the dose.

## 2015-01-15 NOTE — Assessment & Plan Note (Signed)
Ongoing issues Will try duloxetine and increase dose as indicated Could try lyrica as well--but more chance of side effects Discussed plans over half of ~15 minute visit

## 2015-01-15 NOTE — Progress Notes (Signed)
Pre visit review using our clinic review tool, if applicable. No additional management support is needed unless otherwise documented below in the visit note. 

## 2015-01-15 NOTE — Progress Notes (Signed)
Subjective:    Patient ID: [name], male    DOB: 1932-01-02, 81 y.o.   MRN: [id number]  HPI Here due to ongoing neuropathic pain Did increase the gabapentin  No benefit from nortriptyline --even up to 30 mg at night  Hard to initiate sleep--- but then does okay Pain is all day ---all the time Burning mostly  Current Outpatient Prescriptions on File Prior to Visit  Medication Sig Dispense Refill  . amLODipine (NORVASC) 10 MG tablet TAKE 1 TABLET BY MOUTH DAILY 90 tablet 3  . aspirin EC 81 MG tablet Take 81 mg by mouth at bedtime.    [name] azaTHIOprine (IMURAN) 50 MG tablet Take 100 mg by mouth 2 (two) times daily.     . carvedilol (COREG) 12.5 MG tablet TAKE 1 TABLET BY MOUTH TWICE A DAY WITH MEALS 60 tablet 4  . clopidogrel (PLAVIX) 75 MG tablet Take 1 tablet (75 mg total) by mouth daily. 30 tablet 3  . DHA-Vitamin C-Lutein (EYE HEALTH FORMULA PO) Take 1 tablet by mouth daily.     . furosemide (LASIX) 40 MG tablet TAKE ONE OR TWO TABLETS BY MOUTH EVERY DAY. 60 tablet 11  . gabapentin (NEURONTIN) 300 MG capsule Take 600 mg by mouth 2 (two) times daily. Pt states he is taking 2 caps in AM, 2 caps at lunch, 2 caps in PM    . glucose blood (ONE TOUCH TEST STRIPS) test strip Used to test blood sugar once daily dx: 250.60 100 each 1  . isosorbide mononitrate (IMDUR) 30 MG 24 hr tablet TAKE 1 TABLET BY MOUTH DAILY 30 tablet 0  . losartan (COZAAR) 100 MG tablet TAKE 1 TABLET BY MOUTH DAILY 90 tablet 3  . metFORMIN (GLUCOPHAGE) 1000 MG tablet Take 1,000 mg by mouth 2 (two) times daily with a meal.    . Multiple Vitamin (MULTIVITAMIN WITH MINERALS) TABS tablet Take 1 tablet by mouth daily.    . nitroGLYCERIN (NITROSTAT) 0.4 MG SL tablet Place 0.4 mg under the tongue every 5 (five) minutes as needed for chest pain. Call 911 if not resolved after 3 doses    . nortriptyline (PAMELOR) 10 MG capsule Take 1-3 capsules (10-30 mg total) by mouth at bedtime. 90 capsule 11  . omeprazole (PRILOSEC) 40  MG capsule Take 40 mg by mouth daily.    . ONE TOUCH LANCETS MISC Used to test blood sugar once daily dx: 250.60 100 each 1  . pravastatin (PRAVACHOL) 40 MG tablet TAKE 1 TABLET BY MOUTH DAILY 90 tablet 3   No current facility-administered medications on file prior to visit.    Allergies  Allergen Reactions  . Doxazosin Mesylate Other (See Comments)    dizziness  . Methocarbamol Rash    Past Medical History  Diagnosis Date  . Hyperlipidemia   . Hypertension   . GERD (gastroesophageal reflux disease)   . Overweight (BMI 25.0-29.9)     BMI 29  . Interstitial lung disease ([name])     NOS  . Diverticulosis   . Esophageal stricture     a. s/p dilation spring 2010  . CAD (coronary artery disease)     a. s/p CABG (2001)  b. s/p DES to RCA and cutting POBA to ostial PDA (2013)   c. s/p DES to SVG to OM2 (01/14/14)  . History of PFTs     mixed pattern on spiro. mild restn on lung volumes with near normal DLCO. Pattern can be explained by CABG scar. Fev1  2.2L/73%, ratio 68 (67), TLC 4.7/68%,RV 1.5L/55%,DLCO 79%  . Colon polyps   . Chronic kidney disease, stage III (moderate)   . Chronic diastolic CHF (congestive heart failure) ([place])     a) 09/13 ECHO- LVEF 99991111, grade 1 diastolic dysfunction, mild LA dilatation, atrial septal aneurysm, AV mobility restricted, but no sig AS by doppler; b) 09/04/08 ECHO- LVH, ef 60%, mild AS,  . Heart murmur   . Chronic lower back pain   . Dyspnea 2009 since July -Sept    05/06/08-[facility]-  normal effort, reduced VO2 max 20.5 /65%, reduced at 8.2/ 40%, normal breathing resetvca of 55%, submaximal heart rate response 112/77%, flattened o2 pluse response at peak exercise-12 ml/beat @ 85%, No VQ mismatch abnormalities, All c/w CIRC Limitation  . Hiatal hernia   . Type II diabetes mellitus ([place])   . Arthritis     osteoarthritis, s/p R TKR, and digits  . RA (rheumatoid arthritis) ([name])     [name]  . Seropositive rheumatoid arthritis ([place])   . Enlarged  prostate     Past Surgical History  Procedure Laterality Date  . Total knee arthroplasty Right 03/2010    [name]  . Knee arthroscopy Right 2008  . Cataract extraction w/ intraocular lens  implant, bilateral Bilateral   . Coronary artery bypass graft  11/1999    CABG X5  . Coronary stent placement  02/2012    1 stent and balloon  . Shoulder arthroscopy with open rotator cuff repair and distal clavicle acrominectomy Left 02/27/2013    Procedure: LEFT SHOULDER ARTHROSCOPY WITH MINI OPEN ROTATOR CUFF REPAIR AND SUBACROMIAL DECOMPRESSION AND DISTAL CLAVICLE RESECTION;  Surgeon: [name], MD;  Location: [place];  Service: Orthopedics;  Laterality: Left;  . Trigger finger release Left 02/27/2013    Procedure: RELEASE TRIGGER FINGER/A-1 PULLEY;  Surgeon: [name], MD;  Location: [place];  Service: Orthopedics;  Laterality: Left;  . Cholecystectomy open  11/2003    [name]  . Joint replacement    . Esophagogastroduodenoscopy (egd) with esophageal dilation  2010  . Cardiac catheterization  08/2004    CP- no MI, Cath- small vessell disease   . Cardiac catheterization  12/31/2011    80% distal LM, 100% native LAD, LCx and RCA, 30% prox SVG-OM, SVG-D1 normal, 99% distal, 80% ostial SVG-RCA distal to graft, LIMA-LAD normal; LVEF mildly decreased with posterior basal AK   . Coronary angioplasty with stent placement  01/03/2012    Successful DES to SVG-RCA and cutting balloon angioplasty ostial  PDA   . Coronary angioplasty with stent placement  01/14/2014    "1"  . Cardiac catheterization  2009    with patent grafts/notes 12/31/2011  . Left and right heart catheterization with coronary angiogram  12/31/2011    Procedure: LEFT AND RIGHT HEART CATHETERIZATION WITH CORONARY ANGIOGRAM;  Surgeon: Burnell Blanks, MD;  Location: [facility] CATH LAB;  Service: Cardiovascular;;  . Percutaneous coronary stent intervention (pci-s)  12/31/2011    Procedure: PERCUTANEOUS CORONARY STENT INTERVENTION  (PCI-S);  Surgeon: Burnell Blanks, MD;  Location: [facility] CATH LAB;  Service: Cardiovascular;;  . Percutaneous coronary stent intervention (pci-s) N/A 01/03/2012    Procedure: PERCUTANEOUS CORONARY STENT INTERVENTION (PCI-S);  Surgeon: Peter M Martinique, MD;  Location: [facility] CATH LAB;  Service: Cardiovascular;  Laterality: N/A;  . Percutaneous coronary intervention-balloon only  01/03/2012    Procedure: PERCUTANEOUS CORONARY INTERVENTION-BALLOON ONLY;  Surgeon: Peter M Martinique, MD;  Location: [facility] CATH LAB;  Service: Cardiovascular;;  .  Left and right heart catheterization with coronary angiogram N/A 01/14/2014    Procedure: LEFT AND RIGHT HEART CATHETERIZATION WITH CORONARY ANGIOGRAM;  Surgeon: Peter M Martinique, MD;  Location: [facility] CATH LAB;  Service: Cardiovascular;  Laterality: N/A;  . Hand surgery      Family History  Problem Relation Age of Onset  . COPD Mother   . Heart disease Father   . Diabetes Brother   . Colon cancer Brother 25  . Alcohol abuse Sister   . Heart attack Father   . Stroke Sister     Social History   Social History  . Marital Status: Married    Spouse Name: N/A  . Number of Children: 3  . Years of Education: N/A   Occupational History  . lawn mower     retired   Social History Main Topics  . Smoking status: Former Smoker -- 1.00 packs/day for 20 years    Types: Cigarettes    Quit date: 04/06/1963  . Smokeless tobacco: Never Used  . Alcohol Use: No     Comment: 01/01/2012 "last alcohol ~ 50 yr ago"  . Drug Use: No  . Sexual Activity: Not Currently   Other Topics Concern  . Not on file   Social History Narrative   No living will   Requests wife as health care POA   Discussed DNR --he requests this (done 08/29/12)   Not sure about feeding tube---but might accept for some time   Review of Systems Recent visit with [name]-- on higher imuran now (lungs are better on this) Appetite is good    Objective:   Physical Exam        Assessment & Plan:

## 2015-01-16 DIAGNOSIS — J8410 Pulmonary fibrosis, unspecified: Secondary | ICD-10-CM | POA: Diagnosis not present

## 2015-01-16 DIAGNOSIS — R0689 Other abnormalities of breathing: Secondary | ICD-10-CM | POA: Diagnosis not present

## 2015-01-20 DIAGNOSIS — H353213 Exudative age-related macular degeneration, right eye, with inactive scar: Secondary | ICD-10-CM | POA: Diagnosis not present

## 2015-01-20 DIAGNOSIS — H35372 Puckering of macula, left eye: Secondary | ICD-10-CM | POA: Diagnosis not present

## 2015-01-20 DIAGNOSIS — H353122 Nonexudative age-related macular degeneration, left eye, intermediate dry stage: Secondary | ICD-10-CM | POA: Diagnosis not present

## 2015-01-20 DIAGNOSIS — H43813 Vitreous degeneration, bilateral: Secondary | ICD-10-CM | POA: Diagnosis not present

## 2015-01-21 DIAGNOSIS — M47816 Spondylosis without myelopathy or radiculopathy, lumbar region: Secondary | ICD-10-CM | POA: Diagnosis not present

## 2015-01-21 DIAGNOSIS — G609 Hereditary and idiopathic neuropathy, unspecified: Secondary | ICD-10-CM | POA: Diagnosis not present

## 2015-01-28 ENCOUNTER — Encounter: Payer: Self-pay | Admitting: Internal Medicine

## 2015-01-29 ENCOUNTER — Telehealth: Payer: Self-pay | Admitting: *Deleted

## 2015-01-29 MED ORDER — DULOXETINE HCL 60 MG PO CPEP: 60.0000 mg | ORAL_CAPSULE | Freq: Every day | ORAL | Status: DC

## 2015-01-29 NOTE — Telephone Encounter (Signed)
Pt calling about the cymbalta, stating that it helped the burning in his feet but the pain is still there, does he need to increase?

## 2015-01-29 NOTE — Telephone Encounter (Signed)
Yes, assuming he hasn't had any problems with it. Have him increase to 60mg  daily. I sent a new Rx but he can take 2 of the 30mg  tablets for now

## 2015-01-29 NOTE — Telephone Encounter (Signed)
Spoke with patient and advised results   

## 2015-01-30 DIAGNOSIS — N183 Chronic kidney disease, stage 3 (moderate): Secondary | ICD-10-CM | POA: Diagnosis not present

## 2015-02-06 ENCOUNTER — Other Ambulatory Visit: Payer: Self-pay | Admitting: Physician Assistant

## 2015-02-06 ENCOUNTER — Other Ambulatory Visit: Payer: Self-pay | Admitting: Cardiovascular Disease

## 2015-02-07 ENCOUNTER — Other Ambulatory Visit: Payer: Self-pay | Admitting: Physician Assistant

## 2015-02-07 NOTE — Telephone Encounter (Signed)
Reviewed with Devra Dopp, LPN - patient needs to have filled through PCP/ordering provider.

## 2015-02-07 NOTE — Telephone Encounter (Signed)
Pt requesting refill on metformin. Please advise

## 2015-02-10 MED ORDER — DULOXETINE HCL 30 MG PO CPEP: 30.0000 mg | ORAL_CAPSULE | Freq: Two times a day (BID) | ORAL | Status: DC

## 2015-02-10 NOTE — Addendum Note (Signed)
Addended by: Despina Hidden on: 02/10/2015 05:14 PM   Modules accepted: Orders, Medications

## 2015-02-10 NOTE — Telephone Encounter (Signed)
Okay to send in 30mg  bid #60 x 11

## 2015-02-10 NOTE — Telephone Encounter (Signed)
See if he can take 30mg  twice a day (without the dizziness). I don't have other great options for him

## 2015-02-10 NOTE — Telephone Encounter (Addendum)
Pt left v/m; pt cannot take cymbalta 60 mg; it makes pt so dizzy he cannot walk and pt request different med to [facility]. Pt request cb.

## 2015-02-10 NOTE — Telephone Encounter (Signed)
.  left message to have patient return my call.  

## 2015-02-10 NOTE — Telephone Encounter (Addendum)
Spoke with patient and he would like the 30mg  called in because that worked best. Please advise

## 2015-02-10 NOTE — Telephone Encounter (Addendum)
rx sent to pharmacy by e-script Spoke with patient and advised results   

## 2015-02-11 ENCOUNTER — Other Ambulatory Visit: Payer: Self-pay | Admitting: Internal Medicine

## 2015-02-11 ENCOUNTER — Other Ambulatory Visit: Payer: Self-pay | Admitting: Physician Assistant

## 2015-02-12 NOTE — Telephone Encounter (Signed)
Pt requesting a refill on metformin. Please advise

## 2015-02-14 ENCOUNTER — Other Ambulatory Visit: Payer: Self-pay | Admitting: *Deleted

## 2015-02-14 MED ORDER — METFORMIN HCL 1000 MG PO TABS: 1000.0000 mg | ORAL_TABLET | Freq: Two times a day (BID) | ORAL | Status: DC

## 2015-02-16 DIAGNOSIS — R0689 Other abnormalities of breathing: Secondary | ICD-10-CM | POA: Diagnosis not present

## 2015-02-16 DIAGNOSIS — J8410 Pulmonary fibrosis, unspecified: Secondary | ICD-10-CM | POA: Diagnosis not present

## 2015-02-18 ENCOUNTER — Encounter: Payer: Medicare Other | Attending: Internal Medicine | Admitting: Respiratory Therapy

## 2015-02-18 VITALS — Ht 73.0 in | Wt 215.4 lb

## 2015-02-18 DIAGNOSIS — J849 Interstitial pulmonary disease, unspecified: Secondary | ICD-10-CM | POA: Diagnosis not present

## 2015-02-18 NOTE — Patient Instructions (Signed)
Patient Instructions  Patient Details  Name: [name] MRN: [id number] Date of Birth: February 24, 1932 Referring Provider:  Brand Males, MD  Below are the personal goals you chose as well as exercise and nutrition goals. Our goal is to help you keep on track towards obtaining and maintaining your goals. We will be discussing your progress on these goals with you throughout the program.  Initial Exercise Prescription:     Initial Exercise Prescription - 02/18/15 1400    Date of Initial Exercise Prescription   Date 02/18/15   Treadmill   MPH 2   Grade 0   Minutes 10   Recumbant Bike   Level 2   RPM 40   Watts 20   Minutes 10   NuStep   Level 2   Watts 40   Minutes 10   Arm Ergometer   Level 1   Watts 10   Minutes 10   REL-XR   Level 2   Watts 40   Minutes 10   Prescription Details   Frequency (times per week) 3   Duration Progress to 30 minutes of continuous aerobic without signs/symptoms of physical distress   Intensity   THRR REST +  30   Ratings of Perceived Exertion 11-15   Perceived Dyspnea 2-4   Progression Continue progressive overload as per policy without signs/symptoms or physical distress.   Resistance Training   Training Prescription Yes   Weight 2   Reps 10-15      Exercise Goals: Frequency: Be able to perform aerobic exercise three times per week working toward 3-5 days per week.  Intensity: Work with a perceived exertion of 11 (fairly light) - 15 (hard) as tolerated. Follow your new exercise prescription and watch for changes in prescription as you progress with the program. Changes will be reviewed with you when they are made.  Duration: You should be able to do 30 minutes of continuous aerobic exercise in addition to a 5 minute warm-up and a 5 minute cool-down routine.  Nutrition Goals: Your personal nutrition goals will be established when you do your nutrition analysis with the dietician.  The following are nutrition guidelines to  follow: Cholesterol < 200mg /day Sodium < 1500mg /day Fiber: Men over 50 yrs - 30 grams per day  Personal Goals:     Personal Goals and Risk Factors at Admission - 02/18/15 1330    Personal Goals and Risk Factors on Admission   Increase Aerobic Exercise and Physical Activity Yes   Intervention While in program, learn and follow the exercise prescription taught. Start at a low level workload and increase workload after able to maintain previous level for 30 minutes. Increase time before increasing intensity.  Mr [name] wants to increase his stamina and strength. 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 He walks his yard for 10 minutes, 3 times a day.    Understand more about Heart/Pulmonary Disease. Yes   Intervention While in program utilize professionals for any questions, and attend the education sessions. Great websites to use are www.americanheart.org or www.lung.org for reliable information.  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 His rheumatologist suggested increasing his imuran which may help his dyspnea.    Improve shortness of breath with ADL's Yes   Intervention While in program, learn and follow the exercise prescription taught. Start at a low level workload and increase workload ad advised by the  exercise physiologist. Increase time before increasing intensity.  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   Develop more efficient breathing techniques such as purse lipped breathing and diaphragmatic breathing; and practicing self-pacing with activity Yes   Intervention While in program, learn and utilize the specific breathing techniques taught to you. Continue to practice and use the techniques as needed.  Demonstrated good  technique with PLB   Increase knowledge of respiratory medications and ability to use respiratory devices properly.  Yes   Intervention While in program learn and demonstrate appropriate use of your oxygen therapy by increasing flow with exertion, manage oxygen tank operation, including continuous and intermittent flow.  Understanding oxygen is a drug ordered by your physician.  Mr [name] uses 2l/m [place] at night with sleep; [facility]   Diabetes Yes   Goal Blood glucose control identified by blood glucose values, HgbA1C. Participant verbalizes understanding of the signs/symptoms of hyper/hypo glycemia, proper foot care and importance of medication and nutrition plan for blood glucose control.   Intervention Provide nutrition & aerobic exercise along with prescribed medications to achieve blood glucose in normal ranges: Fasting 65-99 mg/dL   Hypertension Yes   Goal Participant will see blood pressure controlled within the values of 140/62mm/Hg or within value directed by their physician.   Intervention Provide nutrition & aerobic exercise along with prescribed medications to achieve BP 140/90 or less.      Tobacco Use Initial Evaluation: History  Smoking status  . Former Smoker -- 1.00 packs/day for 20 years  . Types: Cigarettes  . Quit date: 04/06/1963  Smokeless tobacco  . Never Used    Copy of goals given to participant.

## 2015-02-18 NOTE — Progress Notes (Signed)
Pulmonary Individual Treatment Plan  Patient Details  Name: [name] MRN: [id number] Date of Birth: [birth year] Referring Provider:  Brand Males, MD  Initial Encounter Date: Date: 02/18/15  Visit Diagnosis: ILD (interstitial lung disease) ([place])  Patient's Home Medications on Admission:  Current outpatient prescriptions:  .  amLODipine (NORVASC) 10 MG tablet, TAKE 1 TABLET BY MOUTH DAILY, Disp: 90 tablet, Rfl: 3 .  aspirin EC 81 MG tablet, Take 81 mg by mouth at bedtime., Disp: , Rfl:  .  azaTHIOprine (IMURAN) 50 MG tablet, Take 100 mg by mouth 2 (two) times daily. , Disp: , Rfl:  .  carvedilol (COREG) 12.5 MG tablet, TAKE 1 TABLET BY MOUTH TWICE A DAY WITH MEALS, Disp: 60 tablet, Rfl: 9 .  clopidogrel (PLAVIX) 75 MG tablet, Take 1 tablet (75 mg total) by mouth daily., Disp: 30 tablet, Rfl: 3 .  DHA-Vitamin C-Lutein (EYE HEALTH FORMULA PO), Take 1 tablet by mouth daily. , Disp: , Rfl:  .  DULoxetine (CYMBALTA) 30 MG capsule, Take 1 capsule (30 mg total) by mouth 2 (two) times daily., Disp: 60 capsule, Rfl: 11 .  furosemide (LASIX) 40 MG tablet, TAKE ONE OR TWO TABLETS BY MOUTH EVERY DAY., Disp: 60 tablet, Rfl: 11 .  gabapentin (NEURONTIN) 300 MG capsule, TAKE 3 CAPSULES BY MOUTH EVERY MORNING AND TAKE 4 TO 5 CAPSULES BY MOUTH EVERY NIGHT AT BEDTIME, Disp: 240 capsule, Rfl: 0 .  glucose blood (ONE TOUCH TEST STRIPS) test strip, Used to test blood sugar once daily dx: 250.60, Disp: 100 each, Rfl: 1 .  isosorbide mononitrate (IMDUR) 30 MG 24 hr tablet, TAKE 1 TABLET BY MOUTH DAILY, Disp: 30 tablet, Rfl: 9 .  losartan (COZAAR) 100 MG tablet, TAKE 1 TABLET BY MOUTH DAILY, Disp: 90 tablet, Rfl: 3 .  metFORMIN (GLUCOPHAGE) 1000 MG tablet, Take 1 tablet (1,000 mg total) by mouth 2 (two) times daily with a meal., Disp: 60 tablet, Rfl: 11 .  Multiple Vitamin (MULTIVITAMIN WITH MINERALS) TABS tablet, Take 1 tablet by mouth daily., Disp: , Rfl:  .  nitroGLYCERIN (NITROSTAT) 0.4 MG SL  tablet, Place 0.4 mg under the tongue every 5 (five) minutes as needed for chest pain. Call 911 if not resolved after 3 doses, Disp: , Rfl:  .  nortriptyline (PAMELOR) 10 MG capsule, , Disp: , Rfl:  .  omeprazole (PRILOSEC) 40 MG capsule, Take 40 mg by mouth daily., Disp: , Rfl:  .  ONE TOUCH LANCETS MISC, Used to test blood sugar once daily dx: 250.60, Disp: 100 each, Rfl: 1 .  pravastatin (PRAVACHOL) 40 MG tablet, TAKE 1 TABLET BY MOUTH DAILY, Disp: 90 tablet, Rfl: 3  Past Medical History: Past Medical History  Diagnosis Date  . Hyperlipidemia   . Hypertension   . GERD (gastroesophageal reflux disease)   . Overweight (BMI 25.0-29.9)     BMI 29  . Interstitial lung disease ([name])     NOS  . Diverticulosis   . Esophageal stricture     a. s/p dilation spring 2010  . CAD (coronary artery disease)     a. s/p CABG (2001)  b. s/p DES to RCA and cutting POBA to ostial PDA (2013)   c. s/p DES to SVG to OM2 (01/14/14)  . History of PFTs     mixed pattern on spiro. mild restn on lung volumes with near normal DLCO. Pattern can be explained by CABG scar. Fev1 2.2L/73%, ratio 68 (67), TLC 4.7/68%,RV 1.5L/55%,DLCO 79%  . Colon polyps   .  Chronic kidney disease, stage III (moderate)   . Chronic diastolic CHF (congestive heart failure) ([place])     a) 09/13 ECHO- LVEF 99991111, grade 1 diastolic dysfunction, mild LA dilatation, atrial septal aneurysm, AV mobility restricted, but no sig AS by doppler; b) 09/04/08 ECHO- LVH, ef 60%, mild AS,  . Heart murmur   . Chronic lower back pain   . Dyspnea 2009 since July -Sept    05/06/08-[facility]-  normal effort, reduced VO2 max 20.5 /65%, reduced at 8.2/ 40%, normal breathing resetvca of 55%, submaximal heart rate response 112/77%, flattened o2 pluse response at peak exercise-12 ml/beat @ 85%, No VQ mismatch abnormalities, All c/w CIRC Limitation  . Hiatal hernia   . Type II diabetes mellitus ([place])   . Arthritis     osteoarthritis, s/p R TKR, and digits  . RA  (rheumatoid arthritis) ([name])     [name]  . Seropositive rheumatoid arthritis ([place])   . Enlarged prostate     Tobacco Use: History  Smoking status  . Former Smoker -- 1.00 packs/day for 20 years  . Types: Cigarettes  . Quit date: 04/06/1963  Smokeless tobacco  . Never Used    Labs: Recent Review Flowsheet Data    Labs for ITP Cardiac and Pulmonary Rehab Latest Ref Rng 09/10/2013 01/14/2014 01/14/2014 03/12/2014 09/11/2014   Cholestrol 0 - 200 mg/dL - - - 171 -   LDLCALC 0 - 99 mg/dL - - - 103(H) -   HDL >39.00 mg/dL - - - 34.30(L) -   Trlycerides 0.0 - 149.0 mg/dL - - - 171.0(H) -   Hemoglobin A1c 4.6 - 6.5 % 7.6(H) - - 7.4(H) 7.5(H)   PHART 7.350 - 7.450 - 7.391 - - -   PCO2ART 35.0 - 45.0 mmHg - 50.7(H) - - -   HCO3 20.0 - 24.0 mEq/L - 30.8(H) 31.3(H) - -   TCO2 0 - 100 mmol/L - 32 33 - -   O2SAT - - 98.0 72.0 - -       ADL UCSD:     ADL UCSD      02/18/15 1330       ADL UCSD   ADL Phase Entry     SOB Score total 69     Rest 0     Walk 3     Stairs 5     Bath 1     Dress 3     Shop 3         Pulmonary Function Assessment:     Pulmonary Function Assessment - 02/18/15 1330    Pulmonary Function Tests   DLCO% 59 %   Initial Spirometry Results   FVC% 61 %   FEV1% 60 %   FEV1/FVC Ratio 76   Comments 71   Post Bronchodilator Spirometry Results   FVC% 6 %   FEV1% 56 %   Breath   Bilateral Breath Sounds [place];Basilar   Shortness of Breath Yes;Fear of Shortness of Breath;Limiting activity      Exercise Target Goals: Date: 02/18/15  Exercise Program Goal: Individual exercise prescription set with THRR, safety & activity barriers. Participant demonstrates ability to understand and report RPE using BORG scale, to self-measure pulse accurately, and to acknowledge the importance of the exercise prescription.  Exercise Prescription Goal: Starting with aerobic activity 30 plus minutes a day, 3 days per week for initial exercise prescription. Provide  home exercise prescription and guidelines that participant acknowledges understanding prior to discharge.  Activity Barriers & Risk  Stratification:     Activity Barriers & Risk Stratification - 02/18/15 1330    Activity Barriers & Risk Stratification   Activity Barriers Arthritis;Deconditioning;Muscular Weakness;Shortness of Breath   Risk Stratification Moderate      6 Minute Walk:     6 Minute Walk      02/18/15 1438       6 Minute Walk   Phase Initial     Distance 730 feet     Walk Time 5 minutes     Resting HR 73 bpm     Resting BP 124/76 mmHg     Max Ex. HR 76 bpm     Max Ex. BP 142/76 mmHg     RPE 18     Perceived Dyspnea  6     Symptoms No        Initial Exercise Prescription:     Initial Exercise Prescription - 02/18/15 1400    Date of Initial Exercise Prescription   Date 02/18/15   Treadmill   MPH 2   Grade 0   Minutes 10   Recumbant Bike   Level 2   RPM 40   Watts 20   Minutes 10   NuStep   Level 2   Watts 40   Minutes 10   Arm Ergometer   Level 1   Watts 10   Minutes 10   REL-XR   Level 2   Watts 40   Minutes 10   Prescription Details   Frequency (times per week) 3   Duration Progress to 30 minutes of continuous aerobic without signs/symptoms of physical distress   Intensity   THRR REST +  30   Ratings of Perceived Exertion 11-15   Perceived Dyspnea 2-4   Progression Continue progressive overload as per policy without signs/symptoms or physical distress.   Resistance Training   Training Prescription Yes   Weight 2   Reps 10-15      Exercise Prescription Changes:   Discharge Exercise Prescription (Final Exercise Prescription Changes):    Nutrition:  Target Goals: Understanding of nutrition guidelines, daily intake of sodium 1500mg , cholesterol 200mg , calories 30% from fat and 7% or less from saturated fats, daily to have 5 or more servings of fruits and vegetables.  Biometrics:     Pre Biometrics - 02/18/15 1450     Pre Biometrics   Height 6\' 1"  (1.854 m)   Weight 215 lb 6.4 oz (97.705 kg)   Waist Circumference 42.5 inches   Hip Circumference 44 inches   Waist to Hip Ratio 0.97 %   BMI (Calculated) 28.5       Nutrition Therapy Plan and Nutrition Goals:     Nutrition Therapy & Goals - 02/18/15 1330    Nutrition Therapy   Diet Mr Christhian prefers not to meet with the dietitian; eats out everyday, but he has cut back on bread, potatoes, and pasta which was recommended by his arthritis physician.      Nutrition Discharge: Rate Your Plate Scores:   Psychosocial: Target Goals: Acknowledge presence or absence of depression, maximize coping skills, provide positive support system. Participant is able to verbalize types and ability to use techniques and skills needed for reducing stress and depression.  Initial Review & Psychosocial Screening:     Initial Psych Review & Screening - 02/18/15 1330    Family Dynamics   Good Support System? Yes   Comments Mr Raelyn Ensign has good support from his 3 children, grandchildren, and wife.   Barriers  Psychosocial barriers to participate in program There are no identifiable barriers or psychosocial needs.;The patient should benefit from training in stress management and relaxation.   Screening Interventions   Interventions Encouraged to exercise      Quality of Life Scores:     Quality of Life - 02/18/15 1330    Quality of Life Scores   Health/Function Pre 24.33 %   Socioeconomic Pre 26.75 %   Psych/Spiritual Pre 28.75 %   Family Pre 25.3 %   GLOBAL Pre 25.77 %      PHQ-9:     Recent Review Flowsheet Data    Depression screen [facility] 2/9 02/18/2015 09/11/2014 09/10/2013 06/28/2012   Decreased Interest 0 0 0 0   Down, Depressed, Hopeless 0 0 0 0   PHQ - 2 Score 0 0 0 0      Psychosocial Evaluation and Intervention:   Psychosocial Re-Evaluation:  Education: Education Goals: Education classes will be provided on a weekly basis, covering required  topics. Participant will state understanding/return demonstration of topics presented.  Learning Barriers/Preferences:     Learning Barriers/Preferences - 02/18/15 1330    Learning Barriers/Preferences   Learning Barriers None   Learning Preferences Group Instruction;Individual Instruction;Pictoral;Skilled Demonstration;Verbal Instruction;Video;Written Material      Education Topics: Initial Evaluation Education: - Verbal, written and demonstration of respiratory meds, RPE/PD scales, oximetry and breathing techniques. Instruction on use of nebulizers and MDIs: cleaning and proper use, rinsing mouth with steroid doses and importance of monitoring MDI activations.          Pulmonary Rehab from 02/18/2015 in [place]   Date  02/18/15   Educator  LB   Instruction Review Code  2- meets goals/outcomes      General Nutrition Guidelines/Fats and Fiber: -Group instruction provided by verbal, written material, models and posters to present the general guidelines for heart healthy nutrition. Gives an explanation and review of dietary fats and fiber.   Controlling Sodium/Reading Food Labels: -Group verbal and written material supporting the discussion of sodium use in heart healthy nutrition. Review and explanation with models, verbal and written materials for utilization of the food label.   Exercise Physiology & Risk Factors: - Group verbal and written instruction with models to review the exercise physiology of the cardiovascular system and associated critical values. Details cardiovascular disease risk factors and the goals associated with each risk factor.   Aerobic Exercise & Resistance Training: - Gives group verbal and written discussion on the health impact of inactivity. On the components of aerobic and resistive training programs and the benefits of this training and how to safely progress through these programs.   Flexibility, Balance,  General Exercise Guidelines: - Provides group verbal and written instruction on the benefits of flexibility and balance training programs. Provides general exercise guidelines with specific guidelines to those with heart or lung disease. Demonstration and skill practice provided.   Stress Management: - Provides group verbal and written instruction about the health risks of elevated stress, cause of high stress, and healthy ways to reduce stress.   Depression: - Provides group verbal and written instruction on the correlation between heart/lung disease and depressed mood, treatment options, and the stigmas associated with seeking treatment.   Exercise & Equipment Safety: - Individual verbal instruction and demonstration of equipment use and safety with use of the equipment.   Infection Prevention: - Provides verbal and written material to individual with discussion of infection control including proper hand washing and proper  equipment cleaning during exercise session.   Falls Prevention: - Provides verbal and written material to individual with discussion of falls prevention and safety.      Pulmonary Rehab from 02/18/2015 in [place]   Date  02/18/15   Educator  LB   Instruction Review Code  2- meets goals/outcomes      Diabetes: - Individual verbal and written instruction to review signs/symptoms of diabetes, desired ranges of glucose level fasting, after meals and with exercise. Advice that pre and post exercise glucose checks will be done for 3 sessions at entry of program.   Chronic Lung Diseases: - Group verbal and written instruction to review new updates, new respiratory medications, new advancements in procedures and treatments. Provide informative websites and "800" numbers of self-education.   Lung Procedures: - Group verbal and written instruction to describe testing methods done to diagnose lung disease. Review the outcome of  test results. Describe the treatment choices: Pulmonary Function Tests, ABGs and oximetry.   Energy Conservation: - Provide group verbal and written instruction for methods to conserve energy, plan and organize activities. Instruct on pacing techniques, use of adaptive equipment and posture/positioning to relieve shortness of breath.   Triggers: - Group verbal and written instruction to review types of environmental controls: home humidity, furnaces, filters, dust mite/pet prevention, HEPA vacuums. To discuss weather changes, air quality and the benefits of nasal washing.   Exacerbations: - Group verbal and written instruction to provide: warning signs, infection symptoms, calling MD promptly, preventive modes, and value of vaccinations. Review: effective airway clearance, coughing and/or vibration techniques. Create an Sports administrator.   Oxygen: - Individual and group verbal and written instruction on oxygen therapy. Includes supplement oxygen, available portable oxygen systems, continuous and intermittent flow rates, oxygen safety, concentrators, and Medicare reimbursement for oxygen.   Respiratory Medications: - Group verbal and written instruction to review medications for lung disease. Drug class, frequency, complications, importance of spacers, rinsing mouth after steroid MDI's, and proper cleaning methods for nebulizers.   AED/CPR: - Group verbal and written instruction with the use of models to demonstrate the basic use of the AED with the basic ABC's of resuscitation.   Breathing Retraining: - Provides individuals verbal and written instruction on purpose, frequency, and proper technique of diaphragmatic breathing and pursed-lipped breathing. Applies individual practice skills.      Pulmonary Rehab from 02/18/2015 in [place]   Date  02/18/15   Educator  LB   Instruction Review Code  2- meets goals/outcomes      Anatomy and Physiology  of the Lungs: - Group verbal and written instruction with the use of models to provide basic lung anatomy and physiology related to function, structure and complications of lung disease.   Heart Failure: - Group verbal and written instruction on the basics of heart failure: signs/symptoms, treatments, explanation of ejection fraction, enlarged heart and cardiomyopathy.   Sleep Apnea: - Individual verbal and written instruction to review Obstructive Sleep Apnea. Review of risk factors, methods for diagnosing and types of masks and machines for OSA.   Anxiety: - Provides group, verbal and written instruction on the correlation between heart/lung disease and anxiety, treatment options, and management of anxiety.   Relaxation: - Provides group, verbal and written instruction about the benefits of relaxation for patients with heart/lung disease. Also provides patients with examples of relaxation techniques.   Knowledge Questionnaire Score:     Knowledge Questionnaire Score - 02/18/15 1330  Knowledge Questionnaire Score   Pre Score -7      Personal Goals and Risk Factors at Admission:     Personal Goals and Risk Factors at Admission - 02/18/15 1330    Personal Goals and Risk Factors on Admission   Increase Aerobic Exercise and Physical Activity Yes   Intervention While in program, learn and follow the exercise prescription taught. Start at a low level workload and increase workload after able to maintain previous level for 30 minutes. Increase time before increasing intensity.  Mr Tyheem wants to increase his stamina and strength. 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 He walks his yard for 10 minutes, 3 times a day.    Understand more about Heart/Pulmonary Disease. Yes   Intervention While in program utilize professionals for any questions, and attend the education sessions. Great websites to use are www.americanheart.org or www.lung.org for reliable information.  Mr  Salif has Interstitual Lung Disease and is interested in learning more about his disease and daily management with his shortness of breath. His rheumatologist suggested increasing his imuran which may help his dyspnea.    Improve shortness of breath with ADL's Yes   Intervention While in program, learn and follow the exercise prescription taught. Start at a low level workload and increase workload ad advised by the exercise physiologist. Increase time before increasing intensity.  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   Develop more efficient breathing techniques such as purse lipped breathing and diaphragmatic breathing; and practicing self-pacing with activity Yes   Intervention While in program, learn and utilize the specific breathing techniques taught to you. Continue to practice and use the techniques as needed.  Demonstrated good technique with PLB   Increase knowledge of respiratory medications and ability to use respiratory devices properly.  Yes   Intervention While in program learn and demonstrate appropriate use of your oxygen therapy by increasing flow with exertion, manage oxygen tank operation, including continuous and intermittent flow.  Understanding oxygen is a drug ordered by your physician.  [name] uses 2l/m [place] at night with sleep; [facility]   Diabetes Yes   Goal Blood glucose control identified by blood glucose values, HgbA1C. Participant verbalizes understanding of the signs/symptoms of hyper/hypo glycemia, proper foot care and importance of medication and nutrition plan for blood glucose control.   Intervention Provide nutrition & aerobic exercise along with prescribed medications to achieve blood glucose in normal ranges: Fasting 65-99 mg/dL   Hypertension Yes   Goal Participant will see blood pressure  controlled within the values of 140/45mm/Hg or within value directed by their physician.   Intervention Provide nutrition & aerobic exercise along with prescribed medications to achieve BP 140/90 or less.      Personal Goals and Risk Factors Review:    Personal Goals Discharge (Final Personal Goals and Risk Factors Review):    Comments:

## 2015-02-19 ENCOUNTER — Encounter: Payer: Self-pay | Admitting: Respiratory Therapy

## 2015-02-19 NOTE — Patient Instructions (Signed)
pul Patient Instructions  Patient Details  Name: [name] MRN: [id number] Date of Birth: 1931-10-20 Referring Provider:  Dr Brand Males  Below are the personal goals you chose as well as exercise and nutrition goals. Our goal is to help you keep on track towards obtaining and maintaining your goals. We will be discussing your progress on these goals with you throughout the program.  Initial Exercise Prescription:     Initial Exercise Prescription - 02/18/15 1400    Date of Initial Exercise Prescription   Date 02/18/15   Treadmill   MPH 2   Grade 0   Minutes 10   Recumbant Bike   Level 2   RPM 40   Watts 20   Minutes 10   NuStep   Level 2   Watts 40   Minutes 10   Arm Ergometer   Level 1   Watts 10   Minutes 10   REL-XR   Level 2   Watts 40   Minutes 10   Prescription Details   Frequency (times per week) 3   Duration Progress to 30 minutes of continuous aerobic without signs/symptoms of physical distress   Intensity   THRR REST +  30   Ratings of Perceived Exertion 11-15   Perceived Dyspnea 2-4   Progression Continue progressive overload as per policy without signs/symptoms or physical distress.   Resistance Training   Training Prescription Yes   Weight 2   Reps 10-15      Exercise Goals: Frequency: Be able to perform aerobic exercise three times per week working toward 3-5 days per week.  Intensity: Work with a perceived exertion of 11 (fairly light) - 15 (hard) as tolerated. Follow your new exercise prescription and watch for changes in prescription as you progress with the program. Changes will be reviewed with you when they are made.  Duration: You should be able to do 30 minutes of continuous aerobic exercise in addition to a 5 minute warm-up and a 5 minute cool-down routine.  Nutrition Goals: Your personal nutrition goals will be established when you do your nutrition analysis with the dietician.  The following are nutrition guidelines to  follow: Cholesterol < 200mg /day Sodium < 1500mg /day Fiber: Men over 50 yrs - 30 grams per day  Personal Goals:     Personal Goals and Risk Factors at Admission - 02/18/15 1330    Personal Goals and Risk Factors on Admission   Increase Aerobic Exercise and Physical Activity Yes   Intervention While in program, learn and follow the exercise prescription taught. Start at a low level workload and increase workload after able to maintain previous level for 30 minutes. Increase time before increasing intensity.  Mr [name] wants to increase his stamina and strength. 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 He walks his yard for 10 minutes, 3 times a day.    Understand more about Heart/Pulmonary Disease. Yes   Intervention While in program utilize professionals for any questions, and attend the education sessions. Great websites to use are www.americanheart.org or www.lung.org for reliable information.  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 His rheumatologist suggested increasing his imuran which may help his dyspnea.    Improve shortness of breath with ADL's Yes   Intervention While in program, learn and follow the exercise prescription taught. Start at a low level workload and increase workload ad advised by  the exercise physiologist. Increase time before increasing intensity.  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   Develop more efficient breathing techniques such as purse lipped breathing and diaphragmatic breathing; and practicing self-pacing with activity Yes   Intervention While in program, learn and utilize the specific breathing techniques taught to you. Continue to practice and use the techniques as needed.  Demonstrated good  technique with PLB   Increase knowledge of respiratory medications and ability to use respiratory devices properly.  Yes   Intervention While in program learn and demonstrate appropriate use of your oxygen therapy by increasing flow with exertion, manage oxygen tank operation, including continuous and intermittent flow.  Understanding oxygen is a drug ordered by your physician.  Mr [name] uses 2l/m [place] at night with sleep; [facility]   Diabetes Yes   Goal Blood glucose control identified by blood glucose values, HgbA1C. Participant verbalizes understanding of the signs/symptoms of hyper/hypo glycemia, proper foot care and importance of medication and nutrition plan for blood glucose control.   Intervention Provide nutrition & aerobic exercise along with prescribed medications to achieve blood glucose in normal ranges: Fasting 65-99 mg/dL   Hypertension Yes   Goal Participant will see blood pressure controlled within the values of 140/67mm/Hg or within value directed by their physician.   Intervention Provide nutrition & aerobic exercise along with prescribed medications to achieve BP 140/90 or less.      Tobacco Use Initial Evaluation: History  Smoking status   Former Smoker -- 1.00 packs/day for 20 years   Types: Cigarettes   Quit date: 04/06/1963  Smokeless tobacco   Never Used    Copy of goals given to participant.

## 2015-02-19 NOTE — Progress Notes (Signed)
Pulmonary Individual Treatment Plan  Patient Details  Name: [name] MRN: [id number] Date of Birth: 06/14/1931 Referring Provider:  Dr Brand Males  Initial Encounter Date: 02/18/2015  Visit Diagnosis: Interstitual Pulmonary Lung Disease  Patient's Home Medications on Admission:  Current outpatient prescriptions:    amLODipine (NORVASC) 10 MG tablet, TAKE 1 TABLET BY MOUTH DAILY, Disp: 90 tablet, Rfl: 3   aspirin EC 81 MG tablet, Take 81 mg by mouth at bedtime., Disp: , Rfl:    azaTHIOprine (IMURAN) 50 MG tablet, Take 100 mg by mouth 2 (two) times daily. , Disp: , Rfl:    carvedilol (COREG) 12.5 MG tablet, TAKE 1 TABLET BY MOUTH TWICE A DAY WITH MEALS, Disp: 60 tablet, Rfl: 9   clopidogrel (PLAVIX) 75 MG tablet, Take 1 tablet (75 mg total) by mouth daily., Disp: 30 tablet, Rfl: 3   DHA-Vitamin C-Lutein (EYE HEALTH FORMULA PO), Take 1 tablet by mouth daily. , Disp: , Rfl:    DULoxetine (CYMBALTA) 30 MG capsule, Take 1 capsule (30 mg total) by mouth 2 (two) times daily., Disp: 60 capsule, Rfl: 11   furosemide (LASIX) 40 MG tablet, TAKE ONE OR TWO TABLETS BY MOUTH EVERY DAY., Disp: 60 tablet, Rfl: 11   gabapentin (NEURONTIN) 300 MG capsule, TAKE 3 CAPSULES BY MOUTH EVERY MORNING AND TAKE 4 TO 5 CAPSULES BY MOUTH EVERY NIGHT AT BEDTIME, Disp: 240 capsule, Rfl: 0   glucose blood (ONE TOUCH TEST STRIPS) test strip, Used to test blood sugar once daily dx: 250.60, Disp: 100 each, Rfl: 1   isosorbide mononitrate (IMDUR) 30 MG 24 hr tablet, TAKE 1 TABLET BY MOUTH DAILY, Disp: 30 tablet, Rfl: 9   losartan (COZAAR) 100 MG tablet, TAKE 1 TABLET BY MOUTH DAILY, Disp: 90 tablet, Rfl: 3   metFORMIN (GLUCOPHAGE) 1000 MG tablet, Take 1 tablet (1,000 mg total) by mouth 2 (two) times daily with a meal., Disp: 60 tablet, Rfl: 11   Multiple Vitamin (MULTIVITAMIN WITH MINERALS) TABS tablet, Take 1 tablet by mouth daily., Disp: , Rfl:    nitroGLYCERIN (NITROSTAT) 0.4 MG SL tablet, Place  0.4 mg under the tongue every 5 (five) minutes as needed for chest pain. Call 911 if not resolved after 3 doses, Disp: , Rfl:    nortriptyline (PAMELOR) 10 MG capsule, , Disp: , Rfl:    omeprazole (PRILOSEC) 40 MG capsule, Take 40 mg by mouth daily., Disp: , Rfl:    ONE TOUCH LANCETS MISC, Used to test blood sugar once daily dx: 250.60, Disp: 100 each, Rfl: 1   pravastatin (PRAVACHOL) 40 MG tablet, TAKE 1 TABLET BY MOUTH DAILY, Disp: 90 tablet, Rfl: 3  Past Medical History: Past Medical History  Diagnosis Date   Hyperlipidemia    Hypertension    GERD (gastroesophageal reflux disease)    Overweight (BMI 25.0-29.9)     BMI 29   Interstitial lung disease ([place])     NOS   Diverticulosis    Esophageal stricture     a. s/p dilation spring 2010   CAD (coronary artery disease)     a. s/p CABG (2001)  b. s/p DES to RCA and cutting POBA to ostial PDA (2013)   c. s/p DES to SVG to OM2 (01/14/14)   History of PFTs     mixed pattern on spiro. mild restn on lung volumes with near normal DLCO. Pattern can be explained by CABG scar. Fev1 2.2L/73%, ratio 68 (67), TLC 4.7/68%,RV 1.5L/55%,DLCO 79%   Colon polyps  Chronic kidney disease, stage III (moderate)    Chronic diastolic CHF (congestive heart failure) ([place])     a) 09/13 ECHO- LVEF 99991111, grade 1 diastolic dysfunction, mild LA dilatation, atrial septal aneurysm, AV mobility restricted, but no sig AS by doppler; b) 09/04/08 ECHO- LVH, ef 60%, mild AS,   Heart murmur    Chronic lower back pain    Dyspnea 2009 since July -Sept    05/06/08-[facility]-  normal effort, reduced VO2 max 20.5 /65%, reduced at 8.2/ 40%, normal breathing resetvca of 55%, submaximal heart rate response 112/77%, flattened o2 pluse response at peak exercise-12 ml/beat @ 85%, No VQ mismatch abnormalities, All c/w CIRC Limitation   Hiatal hernia    Type II diabetes mellitus ([place])    Arthritis     osteoarthritis, s/p R TKR, and digits   RA (rheumatoid arthritis)  ([place])     [name]   Seropositive rheumatoid arthritis ([place])    Enlarged prostate     Tobacco Use: History  Smoking status   Former Smoker -- 1.00 packs/day for 20 years   Types: Cigarettes   Quit date: 04/06/1963  Smokeless tobacco   Never Used    Labs: Recent Review Flowsheet Data    Labs for ITP Cardiac and Pulmonary Rehab Latest Ref Rng 09/10/2013 01/14/2014 01/14/2014 03/12/2014 09/11/2014   Cholestrol 0 - 200 mg/dL - - - 171 -   LDLCALC 0 - 99 mg/dL - - - 103(H) -   HDL >39.00 mg/dL - - - 34.30(L) -   Trlycerides 0.0 - 149.0 mg/dL - - - 171.0(H) -   Hemoglobin A1c 4.6 - 6.5 % 7.6(H) - - 7.4(H) 7.5(H)   PHART 7.350 - 7.450 - 7.391 - - -   PCO2ART 35.0 - 45.0 mmHg - 50.7(H) - - -   HCO3 20.0 - 24.0 mEq/L - 30.8(H) 31.3(H) - -   TCO2 0 - 100 mmol/L - 32 33 - -   O2SAT - - 98.0 72.0 - -       ADL UCSD:     ADL UCSD      02/18/15 1330       ADL UCSD   ADL Phase Entry     SOB Score total 69     Rest 0     Walk 3     Stairs 5     Bath 1     Dress 3     Shop 3         Pulmonary Function Assessment:     Pulmonary Function Assessment - 02/18/15 1330    Pulmonary Function Tests   DLCO% 59 %   Initial Spirometry Results   FVC% 61 %   FEV1% 60 %   FEV1/FVC Ratio 76   Comments 71   Post Bronchodilator Spirometry Results   FVC% 6 %   FEV1% 56 %   Breath   Bilateral Breath Sounds Rales;Basilar   Shortness of Breath Yes;Fear of Shortness of Breath;Limiting activity      Exercise Target Goals:    Exercise Program Goal: Individual exercise prescription set with THRR, safety & activity barriers. Participant demonstrates ability to understand and report RPE using BORG scale, to self-measure pulse accurately, and to acknowledge the importance of the exercise prescription.  Exercise Prescription Goal: Starting with aerobic activity 30 plus minutes a day, 3 days per week for initial exercise prescription. Provide home exercise prescription and  guidelines that participant acknowledges understanding prior to discharge.  Activity Barriers & Risk  Stratification:     Activity Barriers & Risk Stratification - 02/18/15 1330    Activity Barriers & Risk Stratification   Activity Barriers Arthritis;Deconditioning;Muscular Weakness;Shortness of Breath   Risk Stratification Moderate      6 Minute Walk:     6 Minute Walk      02/18/15 1438       6 Minute Walk   Phase Initial     Distance 730 feet     Walk Time 5 minutes     Resting HR 73 bpm     Resting BP 124/76 mmHg     Max Ex. HR 76 bpm     Max Ex. BP 142/76 mmHg     RPE 18     Perceived Dyspnea  6     Symptoms No        Initial Exercise Prescription:     Initial Exercise Prescription - 02/18/15 1400    Date of Initial Exercise Prescription   Date 02/18/15   Treadmill   MPH 2   Grade 0   Minutes 10   Recumbant Bike   Level 2   RPM 40   Watts 20   Minutes 10   NuStep   Level 2   Watts 40   Minutes 10   Arm Ergometer   Level 1   Watts 10   Minutes 10   REL-XR   Level 2   Watts 40   Minutes 10   Prescription Details   Frequency (times per week) 3   Duration Progress to 30 minutes of continuous aerobic without signs/symptoms of physical distress   Intensity   THRR REST +  30   Ratings of Perceived Exertion 11-15   Perceived Dyspnea 2-4   Progression Continue progressive overload as per policy without signs/symptoms or physical distress.   Resistance Training   Training Prescription Yes   Weight 2   Reps 10-15      Exercise Prescription Changes:   Discharge Exercise Prescription (Final Exercise Prescription Changes):    Nutrition:  Target Goals: Understanding of nutrition guidelines, daily intake of sodium 1500mg , cholesterol 200mg , calories 30% from fat and 7% or less from saturated fats, daily to have 5 or more servings of fruits and vegetables.  Biometrics:     Pre Biometrics - 02/18/15 1450    Pre Biometrics   Height 6\' 1"   (1.854 m)   Weight 215 lb 6.4 oz (97.705 kg)   Waist Circumference 42.5 inches   Hip Circumference 44 inches   Waist to Hip Ratio 0.97 %   BMI (Calculated) 28.5       Nutrition Therapy Plan and Nutrition Goals:     Nutrition Therapy & Goals - 02/18/15 1330    Nutrition Therapy   Diet Mr Jacorius prefers not to meet with the dietitian; eats out everyday, but he has cut back on bread, potatoes, and pasta which was recommended by his arthritis physician.      Nutrition Discharge: Rate Your Plate Scores:   Psychosocial: Target Goals: Acknowledge presence or absence of depression, maximize coping skills, provide positive support system. Participant is able to verbalize types and ability to use techniques and skills needed for reducing stress and depression.  Initial Review & Psychosocial Screening:     Initial Psych Review & Screening - 02/18/15 1330    Family Dynamics   Good Support System? Yes   Comments Mr Raelyn Ensign has good support from his 3 children, grandchildren, and wife.   Barriers  Psychosocial barriers to participate in program There are no identifiable barriers or psychosocial needs.;The patient should benefit from training in stress management and relaxation.   Screening Interventions   Interventions Encouraged to exercise      Quality of Life Scores:     Quality of Life - 02/18/15 1330    Quality of Life Scores   Health/Function Pre 24.33 %   Socioeconomic Pre 26.75 %   Psych/Spiritual Pre 28.75 %   Family Pre 25.3 %   GLOBAL Pre 25.77 %      PHQ-9:     Recent Review Flowsheet Data    Depression screen [facility] 2/9 02/18/2015 09/11/2014 09/10/2013 06/28/2012   Decreased Interest 0 0 0 0   Down, Depressed, Hopeless 0 0 0 0   PHQ - 2 Score 0 0 0 0      Psychosocial Evaluation and Intervention:   Psychosocial Re-Evaluation:  Education: Education Goals: Education classes will be provided on a weekly basis, covering required topics. Participant will state  understanding/return demonstration of topics presented.  Learning Barriers/Preferences:     Learning Barriers/Preferences - 02/18/15 1330    Learning Barriers/Preferences   Learning Barriers None   Learning Preferences Group Instruction;Individual Instruction;Pictoral;Skilled Demonstration;Verbal Instruction;Video;Written Material      Education Topics: Initial Evaluation Education: - Verbal, written and demonstration of respiratory meds, RPE/PD scales, oximetry and breathing techniques. Instruction on use of nebulizers and MDIs: cleaning and proper use, rinsing mouth with steroid doses and importance of monitoring MDI activations.          Pulmonary Rehab from 02/18/2015 in [place]   Date  02/18/15   Educator  LB   Instruction Review Code  2- meets goals/outcomes      General Nutrition Guidelines/Fats and Fiber: -Group instruction provided by verbal, written material, models and posters to present the general guidelines for heart healthy nutrition. Gives an explanation and review of dietary fats and fiber.   Controlling Sodium/Reading Food Labels: -Group verbal and written material supporting the discussion of sodium use in heart healthy nutrition. Review and explanation with models, verbal and written materials for utilization of the food label.   Exercise Physiology & Risk Factors: - Group verbal and written instruction with models to review the exercise physiology of the cardiovascular system and associated critical values. Details cardiovascular disease risk factors and the goals associated with each risk factor.   Aerobic Exercise & Resistance Training: - Gives group verbal and written discussion on the health impact of inactivity. On the components of aerobic and resistive training programs and the benefits of this training and how to safely progress through these programs.   Flexibility, Balance, General Exercise Guidelines: -  Provides group verbal and written instruction on the benefits of flexibility and balance training programs. Provides general exercise guidelines with specific guidelines to those with heart or lung disease. Demonstration and skill practice provided.   Stress Management: - Provides group verbal and written instruction about the health risks of elevated stress, cause of high stress, and healthy ways to reduce stress.   Depression: - Provides group verbal and written instruction on the correlation between heart/lung disease and depressed mood, treatment options, and the stigmas associated with seeking treatment.   Exercise & Equipment Safety: - Individual verbal instruction and demonstration of equipment use and safety with use of the equipment.   Infection Prevention: - Provides verbal and written material to individual with discussion of infection control including proper hand washing and proper  equipment cleaning during exercise session.   Falls Prevention: - Provides verbal and written material to individual with discussion of falls prevention and safety.      Pulmonary Rehab from 02/18/2015 in [place]   Date  02/18/15   Educator  LB   Instruction Review Code  2- meets goals/outcomes      Diabetes: - Individual verbal and written instruction to review signs/symptoms of diabetes, desired ranges of glucose level fasting, after meals and with exercise. Advice that pre and post exercise glucose checks will be done for 3 sessions at entry of program.   Chronic Lung Diseases: - Group verbal and written instruction to review new updates, new respiratory medications, new advancements in procedures and treatments. Provide informative websites and "800" numbers of self-education.   Lung Procedures: - Group verbal and written instruction to describe testing methods done to diagnose lung disease. Review the outcome of test results. Describe the  treatment choices: Pulmonary Function Tests, ABGs and oximetry.   Energy Conservation: - Provide group verbal and written instruction for methods to conserve energy, plan and organize activities. Instruct on pacing techniques, use of adaptive equipment and posture/positioning to relieve shortness of breath.   Triggers: - Group verbal and written instruction to review types of environmental controls: home humidity, furnaces, filters, dust mite/pet prevention, HEPA vacuums. To discuss weather changes, air quality and the benefits of nasal washing.   Exacerbations: - Group verbal and written instruction to provide: warning signs, infection symptoms, calling MD promptly, preventive modes, and value of vaccinations. Review: effective airway clearance, coughing and/or vibration techniques. Create an Sports administrator.   Oxygen: - Individual and group verbal and written instruction on oxygen therapy. Includes supplement oxygen, available portable oxygen systems, continuous and intermittent flow rates, oxygen safety, concentrators, and Medicare reimbursement for oxygen.   Respiratory Medications: - Group verbal and written instruction to review medications for lung disease. Drug class, frequency, complications, importance of spacers, rinsing mouth after steroid MDI's, and proper cleaning methods for nebulizers.   AED/CPR: - Group verbal and written instruction with the use of models to demonstrate the basic use of the AED with the basic ABC's of resuscitation.   Breathing Retraining: - Provides individuals verbal and written instruction on purpose, frequency, and proper technique of diaphragmatic breathing and pursed-lipped breathing. Applies individual practice skills.      Pulmonary Rehab from 02/18/2015 in [place]   Date  02/18/15   Educator  LB   Instruction Review Code  2- meets goals/outcomes      Anatomy and Physiology of the Lungs: - Group  verbal and written instruction with the use of models to provide basic lung anatomy and physiology related to function, structure and complications of lung disease.   Heart Failure: - Group verbal and written instruction on the basics of heart failure: signs/symptoms, treatments, explanation of ejection fraction, enlarged heart and cardiomyopathy.   Sleep Apnea: - Individual verbal and written instruction to review Obstructive Sleep Apnea. Review of risk factors, methods for diagnosing and types of masks and machines for OSA.   Anxiety: - Provides group, verbal and written instruction on the correlation between heart/lung disease and anxiety, treatment options, and management of anxiety.   Relaxation: - Provides group, verbal and written instruction about the benefits of relaxation for patients with heart/lung disease. Also provides patients with examples of relaxation techniques.   Knowledge Questionnaire Score:     Knowledge Questionnaire Score - 02/18/15 1330  Knowledge Questionnaire Score   Pre Score -7      Personal Goals and Risk Factors at Admission:     Personal Goals and Risk Factors at Admission - 02/18/15 1330    Personal Goals and Risk Factors on Admission   Increase Aerobic Exercise and Physical Activity Yes   Intervention While in program, learn and follow the exercise prescription taught. Start at a low level workload and increase workload after able to maintain previous level for 30 minutes. Increase time before increasing intensity.  Mr Previn wants to increase his stamina and strength. 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 He walks his yard for 10 minutes, 3 times a day.    Understand more about Heart/Pulmonary Disease. Yes   Intervention While in program utilize professionals for any questions, and attend the education sessions. Great websites to use are www.americanheart.org or www.lung.org for reliable information.  Mr Aniruddh has Interstitual  Lung Disease and is interested in learning more about his disease and daily management with his shortness of breath. His rheumatologist suggested increasing his imuran which may help his dyspnea.    Improve shortness of breath with ADL's Yes   Intervention While in program, learn and follow the exercise prescription taught. Start at a low level workload and increase workload ad advised by the exercise physiologist. Increase time before increasing intensity.  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   Develop more efficient breathing techniques such as purse lipped breathing and diaphragmatic breathing; and practicing self-pacing with activity Yes   Intervention While in program, learn and utilize the specific breathing techniques taught to you. Continue to practice and use the techniques as needed.  Demonstrated good technique with PLB   Increase knowledge of respiratory medications and ability to use respiratory devices properly.  Yes   Intervention While in program learn and demonstrate appropriate use of your oxygen therapy by increasing flow with exertion, manage oxygen tank operation, including continuous and intermittent flow.  Understanding oxygen is a drug ordered by your physician.  [name] uses 2l/m [place] at night with sleep; [facility]   Diabetes Yes   Goal Blood glucose control identified by blood glucose values, HgbA1C. Participant verbalizes understanding of the signs/symptoms of hyper/hypo glycemia, proper foot care and importance of medication and nutrition plan for blood glucose control.   Intervention Provide nutrition & aerobic exercise along with prescribed medications to achieve blood glucose in normal ranges: Fasting 65-99 mg/dL   Hypertension Yes   Goal Participant will see blood pressure controlled within the  values of 140/19mm/Hg or within value directed by their physician.   Intervention Provide nutrition & aerobic exercise along with prescribed medications to achieve BP 140/90 or less.      Personal Goals and Risk Factors Review:    Personal Goals Discharge (Final Personal Goals and Risk Factors Review):    Comments: Mr Cajun plans to start [facility] on 02/26/2015. He plans to attend 2 day/week.

## 2015-02-26 DIAGNOSIS — J849 Interstitial pulmonary disease, unspecified: Secondary | ICD-10-CM

## 2015-02-26 LAB — GLUCOSE, CAPILLARY
GLUCOSE-CAPILLARY: 179 mg/dL — AB (ref 65–99)
Glucose-Capillary: 184 mg/dL — ABNORMAL HIGH (ref 65–99)

## 2015-02-26 NOTE — Progress Notes (Signed)
Daily Session Note  Patient Details  Name: [name] MRN: [id number] Date of Birth: [birth year] Referring Provider:  Brand Males, MD  Encounter Date: 02/26/2015  Check In:     Session Check In - 02/26/15 1015    Check-In   Staff Present Heath Lark, RN, BSN, CCRP;Lovette Merta, BS, ACSM EP-C, Exercise Physiologist   ER physicians immediately available to respond to emergencies [facility] immediately available ER MD   Physician(s) gayle and stafford   Medication changes reported     No   Fall or balance concerns reported    No   Warm-up and Cool-down Performed on first and last piece of equipment   VAD Patient? No   Pain Assessment   Currently in Pain? No/denies         Goals Met:  Proper associated with RPD/PD & O2 Sat Exercise tolerated well No report of cardiac concerns or symptoms Strength training completed today  Goals Unmet:  Not Applicable  Goals Comments:   Dr. Emily Filbert is Medical Director for [facility] and [facility].

## 2015-03-03 ENCOUNTER — Encounter: Payer: Medicare Other | Admitting: *Deleted

## 2015-03-03 DIAGNOSIS — J849 Interstitial pulmonary disease, unspecified: Secondary | ICD-10-CM | POA: Diagnosis not present

## 2015-03-03 NOTE — Progress Notes (Signed)
Daily Session Note  Patient Details  Name: [name] MRN: [id number] Date of Birth: [birth year] Referring Provider:  Brand Males, MD  Encounter Date: 03/03/2015  Check In:     Session Check In - 03/03/15 1035    Check-In   Staff Present Carson Myrtle, BS, RRT, Respiratory Therapist;Steven Way, BS, ACSM EP-C, Exercise Physiologist;Rylen Swindler Amedeo Plenty, BS, ACSM CEP, Exercise Physiologist   ER physicians immediately available to respond to emergencies [facility] immediately available ER MD   Physician(s) Jimmye Norman and Schaevit   Medication changes reported     No   Fall or balance concerns reported    No   Warm-up and Cool-down Performed on first and last piece of equipment   VAD Patient? No   Pain Assessment   Currently in Pain? No/denies         Goals Met:  Proper associated with RPD/PD & O2 Sat Independence with exercise equipment Exercise tolerated well Strength training completed today  Goals Unmet:  Not Applicable  Goals Comments: Patient completed exercise prescription and all exercise goals during rehab session. The exercise was tolerated well and the patient is progressing in the program.     Dr. Emily Filbert is Medical Director for [name] and [facility].

## 2015-03-05 ENCOUNTER — Encounter: Payer: Medicare Other | Admitting: *Deleted

## 2015-03-05 DIAGNOSIS — J849 Interstitial pulmonary disease, unspecified: Secondary | ICD-10-CM

## 2015-03-05 LAB — GLUCOSE, CAPILLARY
GLUCOSE-CAPILLARY: 164 mg/dL — AB (ref 65–99)
Glucose-Capillary: 186 mg/dL — ABNORMAL HIGH (ref 65–99)

## 2015-03-05 NOTE — Progress Notes (Signed)
Daily Session Note  Patient Details  Name: [name] MRN: [id number] Date of Birth: March 13, 1932 Referring Provider:  Brand Males, MD  Encounter Date: 03/05/2015  Check In:     Session Check In - 03/05/15 1105    Check-In   Staff Present Carson Myrtle, BS, RRT, Respiratory Therapist;Ishita Mcnerney Dillard Essex, MS, ACSM CEP, Exercise Physiologist;Steven Way, BS, ACSM EP-C, Exercise Physiologist   ER physicians immediately available to respond to emergencies [facility] immediately available ER MD   Physician(s) Jimmye Norman and Clearnce Hasten   Medication changes reported     No   Fall or balance concerns reported    No   Warm-up and Cool-down Performed on first and last piece of equipment   VAD Patient? No   Pain Assessment   Currently in Pain? No/denies   Multiple Pain Sites No           Exercise Prescription Changes - 03/05/15 1100    Exercise Review   Progression No   Response to Exercise   Symptoms None   Comments Patient is still new to the program and procedures so the patient was oriented to the gym and the equipment functions and settings. Procedures and policies of the gym were outlined and explained. The patient's individual exercise prescription and treatment plan were reviewed with them. All starting workloads were established based on the results of the functional testing  done at the initial intake visit. The plan for exercise progression was also introduced and progression will be customized based on the patient's performance and goals.   Duration Progress to 50 minutes of aerobic without signs/symptoms of physical distress   Intensity THRR unchanged   Progression Continue progressive overload as per policy without signs/symptoms or physical distress.   Resistance Training   Training Prescription Yes   Weight 2   Reps 10-15   Interval Training   Interval Training No   Treadmill   MPH 2   Grade 0   Minutes 15   NuStep   Level 2   Watts 40   Minutes 15   REL-XR   Level 2   Watts 40   Minutes 15      Goals Met:  Proper associated with RPD/PD & O2 Sat Independence with exercise equipment Using PLB without cueing & demonstrates good technique Exercise tolerated well Personal goals reviewed Strength training completed today  Goals Unmet:  Not Applicable  Goals Comments: Patient completed exercise prescription and all exercise goals during rehab session. The exercise was tolerated well and the patient is progressing in the program. Personal and exercise goals expected to be met in 32 more sessions. Progress on specific individualized goals will be charted in patient's ITP. Upon completion of the program the patient will be comfortable managing exercise goals and progression on their own.    Dr. Emily Filbert is Medical Director for [name] and [facility].

## 2015-03-07 ENCOUNTER — Encounter: Payer: Medicare Other | Attending: Internal Medicine

## 2015-03-07 DIAGNOSIS — J849 Interstitial pulmonary disease, unspecified: Secondary | ICD-10-CM | POA: Insufficient documentation

## 2015-03-10 ENCOUNTER — Encounter: Payer: Self-pay | Admitting: *Deleted

## 2015-03-10 DIAGNOSIS — J849 Interstitial pulmonary disease, unspecified: Secondary | ICD-10-CM

## 2015-03-10 DIAGNOSIS — Z961 Presence of intraocular lens: Secondary | ICD-10-CM | POA: Diagnosis not present

## 2015-03-10 DIAGNOSIS — E119 Type 2 diabetes mellitus without complications: Secondary | ICD-10-CM | POA: Diagnosis not present

## 2015-03-10 DIAGNOSIS — H353213 Exudative age-related macular degeneration, right eye, with inactive scar: Secondary | ICD-10-CM | POA: Diagnosis not present

## 2015-03-10 DIAGNOSIS — Z79899 Other long term (current) drug therapy: Secondary | ICD-10-CM | POA: Diagnosis not present

## 2015-03-10 DIAGNOSIS — H353121 Nonexudative age-related macular degeneration, left eye, early dry stage: Secondary | ICD-10-CM | POA: Diagnosis not present

## 2015-03-10 LAB — HM DIABETES EYE EXAM

## 2015-03-10 NOTE — Progress Notes (Signed)
Pulmonary Individual Treatment Plan  Patient Details  Name: [name] MRN: [id number] Date of Birth: 1931/05/16 Referring Provider:  [name], MD   Initial Encounter Date:  02/18/2015  Visit Diagnosis: ILD (interstitial lung disease) ([place])  Patient's Home Medications on Admission:  Current outpatient prescriptions:  .  amLODipine (NORVASC) 10 MG tablet, TAKE 1 TABLET BY MOUTH DAILY, Disp: 90 tablet, Rfl: 3 .  aspirin EC 81 MG tablet, Take 81 mg by mouth at bedtime., Disp: , Rfl:  .  azaTHIOprine (IMURAN) 50 MG tablet, Take 100 mg by mouth 2 (two) times daily. , Disp: , Rfl:  .  carvedilol (COREG) 12.5 MG tablet, TAKE 1 TABLET BY MOUTH TWICE A DAY WITH MEALS, Disp: 60 tablet, Rfl: 9 .  clopidogrel (PLAVIX) 75 MG tablet, Take 1 tablet (75 mg total) by mouth daily., Disp: 30 tablet, Rfl: 3 .  DHA-Vitamin C-Lutein (EYE HEALTH FORMULA PO), Take 1 tablet by mouth daily. , Disp: , Rfl:  .  DULoxetine (CYMBALTA) 30 MG capsule, Take 1 capsule (30 mg total) by mouth 2 (two) times daily., Disp: 60 capsule, Rfl: 11 .  furosemide (LASIX) 40 MG tablet, TAKE ONE OR TWO TABLETS BY MOUTH EVERY DAY., Disp: 60 tablet, Rfl: 11 .  gabapentin (NEURONTIN) 300 MG capsule, TAKE 3 CAPSULES BY MOUTH EVERY MORNING AND TAKE 4 TO 5 CAPSULES BY MOUTH EVERY NIGHT AT BEDTIME, Disp: 240 capsule, Rfl: 0 .  glucose blood (ONE TOUCH TEST STRIPS) test strip, Used to test blood sugar once daily dx: 250.60, Disp: 100 each, Rfl: 1 .  isosorbide mononitrate (IMDUR) 30 MG 24 hr tablet, TAKE 1 TABLET BY MOUTH DAILY, Disp: 30 tablet, Rfl: 9 .  losartan (COZAAR) 100 MG tablet, TAKE 1 TABLET BY MOUTH DAILY, Disp: 90 tablet, Rfl: 3 .  metFORMIN (GLUCOPHAGE) 1000 MG tablet, Take 1 tablet (1,000 mg total) by mouth 2 (two) times daily with a meal., Disp: 60 tablet, Rfl: 11 .  Multiple Vitamin (MULTIVITAMIN WITH MINERALS) TABS tablet, Take 1 tablet by mouth daily., Disp: , Rfl:  .  nitroGLYCERIN (NITROSTAT) 0.4 MG SL tablet,  Place 0.4 mg under the tongue every 5 (five) minutes as needed for chest pain. Call 911 if not resolved after 3 doses, Disp: , Rfl:  .  nortriptyline (PAMELOR) 10 MG capsule, , Disp: , Rfl:  .  omeprazole (PRILOSEC) 40 MG capsule, Take 40 mg by mouth daily., Disp: , Rfl:  .  ONE TOUCH LANCETS MISC, Used to test blood sugar once daily dx: 250.60, Disp: 100 each, Rfl: 1 .  pravastatin (PRAVACHOL) 40 MG tablet, TAKE 1 TABLET BY MOUTH DAILY, Disp: 90 tablet, Rfl: 3  Past Medical History: Past Medical History  Diagnosis Date  . Hyperlipidemia   . Hypertension   . GERD (gastroesophageal reflux disease)   . Overweight (BMI 25.0-29.9)     BMI 29  . Interstitial lung disease ([name])     NOS  . Diverticulosis   . Esophageal stricture     a. s/p dilation spring 2010  . CAD (coronary artery disease)     a. s/p CABG (2001)  b. s/p DES to RCA and cutting POBA to ostial PDA (2013)   c. s/p DES to SVG to OM2 (01/14/14)  . History of PFTs     mixed pattern on spiro. mild restn on lung volumes with near normal DLCO. Pattern can be explained by CABG scar. Fev1 2.2L/73%, ratio 68 (67), TLC 4.7/68%,RV 1.5L/55%,DLCO 79%  . Colon  polyps   . Chronic kidney disease, stage III (moderate)   . Chronic diastolic CHF (congestive heart failure) ([place])     a) 09/13 ECHO- LVEF 37-10%, grade 1 diastolic dysfunction, mild LA dilatation, atrial septal aneurysm, AV mobility restricted, but no sig AS by doppler; b) 09/04/08 ECHO- LVH, ef 60%, mild AS,  . Heart murmur   . Chronic lower back pain   . Dyspnea 2009 since July -Sept    05/06/08-[facility]-  normal effort, reduced VO2 max 20.5 /65%, reduced at 8.2/ 40%, normal breathing resetvca of 55%, submaximal heart rate response 112/77%, flattened o2 pluse response at peak exercise-12 ml/beat @ 85%, No VQ mismatch abnormalities, All c/w CIRC Limitation  . Hiatal hernia   . Type II diabetes mellitus ([place])   . Arthritis     osteoarthritis, s/p R TKR, and digits  . RA (rheumatoid  arthritis) ([name])     [name]  . Seropositive rheumatoid arthritis ([place])   . Enlarged prostate     Tobacco Use: History  Smoking status  . Former Smoker -- 1.00 packs/day for 20 years  . Types: Cigarettes  . Quit date: 04/06/1963  Smokeless tobacco  . Never Used    Labs: Recent Review Flowsheet Data    Labs for ITP Cardiac and Pulmonary Rehab Latest Ref Rng 09/10/2013 01/14/2014 01/14/2014 03/12/2014 09/11/2014   Cholestrol 0 - 200 mg/dL - - - 171 -   LDLCALC 0 - 99 mg/dL - - - 103(H) -   HDL >39.00 mg/dL - - - 34.30(L) -   Trlycerides 0.0 - 149.0 mg/dL - - - 171.0(H) -   Hemoglobin A1c 4.6 - 6.5 % 7.6(H) - - 7.4(H) 7.5(H)   PHART 7.350 - 7.450 - 7.391 - - -   PCO2ART 35.0 - 45.0 mmHg - 50.7(H) - - -   HCO3 20.0 - 24.0 mEq/L - 30.8(H) 31.3(H) - -   TCO2 0 - 100 mmol/L - 32 33 - -   O2SAT - - 98.0 72.0 - -         POCT Glucose      02/26/15 1550 03/05/15 1527         POCT Blood Glucose   Pre-Exercise 179 mg/dL       Post-Exercise 184 mg/dL       Pre-Exercise #2 245 mg/dL       Pre-Exercise #3  164 mg/dL      Post-Exercise #3  186 mg/dL         ADL UCSD:     ADL UCSD      02/18/15 1330       ADL UCSD   ADL Phase Entry     SOB Score total 69     Rest 0     Walk 3     Stairs 5     Bath 1     Dress 3     Shop 3         Pulmonary Function Assessment:     Pulmonary Function Assessment - 02/18/15 1330    Pulmonary Function Tests   DLCO% 59 %   Initial Spirometry Results   FVC% 61 %   FEV1% 60 %   FEV1/FVC Ratio 76   Comments 71   Post Bronchodilator Spirometry Results   FVC% 6 %   FEV1% 56 %   Breath   Bilateral Breath Sounds [place];Basilar   Shortness of Breath Yes;Fear of Shortness of Breath;Limiting activity      Exercise Target  Goals:    Exercise Program Goal: Individual exercise prescription set with THRR, safety & activity barriers. Participant demonstrates ability to understand and report RPE using BORG scale, to self-measure  pulse accurately, and to acknowledge the importance of the exercise prescription.  Exercise Prescription Goal: Starting with aerobic activity 30 plus minutes a day, 3 days per week for initial exercise prescription. Provide home exercise prescription and guidelines that participant acknowledges understanding prior to discharge.  Activity Barriers & Risk Stratification:     Activity Barriers & Risk Stratification - 02/18/15 1330    Activity Barriers & Risk Stratification   Activity Barriers Arthritis;Deconditioning;Muscular Weakness;Shortness of Breath   Risk Stratification Moderate      6 Minute Walk:     6 Minute Walk      02/18/15 1438       6 Minute Walk   Phase Initial     Distance 730 feet     Walk Time 5 minutes     Resting HR 73 bpm     Resting BP 124/76 mmHg     Max Ex. HR 76 bpm     Max Ex. BP 142/76 mmHg     RPE 18     Perceived Dyspnea  6     Symptoms No        Initial Exercise Prescription:     Initial Exercise Prescription - 02/18/15 1400    Date of Initial Exercise Prescription   Date 02/18/15   Treadmill   MPH 2   Grade 0   Minutes 10   Recumbant Bike   Level 2   RPM 40   Watts 20   Minutes 10   NuStep   Level 2   Watts 40   Minutes 10   Arm Ergometer   Level 1   Watts 10   Minutes 10   REL-XR   Level 2   Watts 40   Minutes 10   Prescription Details   Frequency (times per week) 3   Duration Progress to 30 minutes of continuous aerobic without signs/symptoms of physical distress   Intensity   THRR REST +  30   Ratings of Perceived Exertion 11-15   Perceived Dyspnea 2-4   Progression Continue progressive overload as per policy without signs/symptoms or physical distress.   Resistance Training   Training Prescription Yes   Weight 2   Reps 10-15      Exercise Prescription Changes:     Exercise Prescription Changes      03/03/15 1643 03/05/15 1100         Exercise Review   Progression No No      Response to Exercise    Blood Pressure (Admit) 120/68 mmHg       Blood Pressure (Exercise) 138/76 mmHg       Blood Pressure (Exit) 108/64 mmHg       Heart Rate (Admit) 73 bpm       Heart Rate (Exercise) 83 bpm       Heart Rate (Exit) 73 bpm       Oxygen Saturation (Admit) 95 %       Oxygen Saturation (Exercise) 94 %       Oxygen Saturation (Exit) 97 %       Rating of Perceived Exertion (Exercise) 13       Perceived Dyspnea (Exercise) 3       Symptoms none None      Comments  Patient is still new to the program and  procedures so the patient was oriented to the gym and the equipment functions and settings. Procedures and policies of the gym were outlined and explained. The patient's individual exercise prescription and treatment plan were reviewed with them. All starting workloads were established based on the results of the functional testing  done at the initial intake visit. The plan for exercise progression was also introduced and progression will be customized based on the patient's performance and goals.      Duration Progress to 50 minutes of aerobic without signs/symptoms of physical distress Progress to 50 minutes of aerobic without signs/symptoms of physical distress      Intensity THRR unchanged THRR unchanged      Progression Continue progressive overload as per policy without signs/symptoms or physical distress. Continue progressive overload as per policy without signs/symptoms or physical distress.      Resistance Training   Training Prescription Yes Yes      Weight 2 3      Reps 10-15 10-15      Interval Training   Interval Training No No      Treadmill   MPH 2 2      Grade 0 0      Minutes 15 15      NuStep   Level 2 2      Watts 40 40      Minutes 15 15      REL-XR   Level 2 2      Watts 40 40      Minutes 15 15         Discharge Exercise Prescription (Final Exercise Prescription Changes):     Exercise Prescription Changes - 03/05/15 1100    Exercise Review   Progression No    Response to Exercise   Symptoms None   Comments Patient is still new to the program and procedures so the patient was oriented to the gym and the equipment functions and settings. Procedures and policies of the gym were outlined and explained. The patient's individual exercise prescription and treatment plan were reviewed with them. All starting workloads were established based on the results of the functional testing  done at the initial intake visit. The plan for exercise progression was also introduced and progression will be customized based on the patient's performance and goals.   Duration Progress to 50 minutes of aerobic without signs/symptoms of physical distress   Intensity THRR unchanged   Progression Continue progressive overload as per policy without signs/symptoms or physical distress.   Resistance Training   Training Prescription Yes   Weight 3   Reps 10-15   Interval Training   Interval Training No   Treadmill   MPH 2   Grade 0   Minutes 15   NuStep   Level 2   Watts 40   Minutes 15   REL-XR   Level 2   Watts 40   Minutes 15       Nutrition:  Target Goals: Understanding of nutrition guidelines, daily intake of sodium '1500mg'$ , cholesterol '200mg'$ , calories 30% from fat and 7% or less from saturated fats, daily to have 5 or more servings of fruits and vegetables.  Biometrics:     Pre Biometrics - 02/18/15 1450    Pre Biometrics   Height $Remov'6\' 1"'sFiEWN$  (1.854 m)   Weight 215 lb 6.4 oz (97.705 kg)   Waist Circumference 42.5 inches   Hip Circumference 44 inches   Waist to Hip Ratio 0.97 %   BMI (  Calculated) 28.5       Nutrition Therapy Plan and Nutrition Goals:     Nutrition Therapy & Goals - 02/18/15 1330    Nutrition Therapy   Diet [name] prefers not to meet with the dietitian; eats out everyday, but he has cut back on bread, potatoes, and pasta which was recommended by his arthritis physician.      Nutrition Discharge: Rate Your Plate  Scores:   Psychosocial: Target Goals: Acknowledge presence or absence of depression, maximize coping skills, provide positive support system. Participant is able to verbalize types and ability to use techniques and skills needed for reducing stress and depression.  Initial Review & Psychosocial Screening:     Initial Psych Review & Screening - 02/18/15 1330    Family Dynamics   Good Support System? Yes   Comments [name] has good support from his 3 children, grandchildren, and wife.   Barriers   Psychosocial barriers to participate in program There are no identifiable barriers or psychosocial needs.;The patient should benefit from training in stress management and relaxation.   Screening Interventions   Interventions Encouraged to exercise      Quality of Life Scores:     Quality of Life - 02/18/15 1330    Quality of Life Scores   Health/Function Pre 24.33 %   Socioeconomic Pre 26.75 %   Psych/Spiritual Pre 28.75 %   Family Pre 25.3 %   GLOBAL Pre 25.77 %      PHQ-9:     Recent Review Flowsheet Data    Depression screen [facility] 2/9 02/18/2015 09/11/2014 09/10/2013 06/28/2012   Decreased Interest 0 0 0 0   Down, Depressed, Hopeless 0 0 0 0   PHQ - 2 Score 0 0 0 0      Psychosocial Evaluation and Intervention:     Psychosocial Evaluation - 03/05/15 1054    Psychosocial Evaluation & Interventions   Interventions Encouraged to exercise with the program and follow exercise prescription;Relaxation education   Comments Counselor met with [name]. [name] todya for initial psychosocial evaluation.  He is an 81 year old who has been suffering with shortness of breath.   He has a strong support system with a spouse of almost 60 years and an older daughter who also lives in the same home.  [name]. [name] is also actively involved in his local church community.  He reports having problems with his feet and his eyes as well as type 2 diabetes in addition to IPD.  He states he sleeps well and has a  good appetite.  [name]. Curt denies a history of depression or anxiety or current symptoms.  He states he is typically in a positive mood but that his health condition is his primary stressor at this time.  [name]. Emiel has goals to breathe better and walk better while in this program.  He is a member at the [facility] and plans to consistently exercise there following completion of this program.     Continued Psychosocial Services Needed Yes  [name]. [name] will benefit from the psychoeducational components of this program, particularly stress management.        Psychosocial Re-Evaluation:  Education: Education Goals: Education classes will be provided on a weekly basis, covering required topics. Participant will state understanding/return demonstration of topics presented.  Learning Barriers/Preferences:     Learning Barriers/Preferences - 02/18/15 1330    Learning Barriers/Preferences   Learning Barriers None   Learning Preferences Group Instruction;Individual Instruction;Pictoral;Skilled Demonstration;Verbal Instruction;Video;Written Material  Education Topics: Initial Evaluation Education: - Verbal, written and demonstration of respiratory meds, RPE/PD scales, oximetry and breathing techniques. Instruction on use of nebulizers and MDIs: cleaning and proper use, rinsing mouth with steroid doses and importance of monitoring MDI activations.          Pulmonary Rehab from 03/05/2015 in [place]   Date  02/18/15   Educator  LB   Instruction Review Code  2- meets goals/outcomes      General Nutrition Guidelines/Fats and Fiber: -Group instruction provided by verbal, written material, models and posters to present the general guidelines for heart healthy nutrition. Gives an explanation and review of dietary fats and fiber.   Controlling Sodium/Reading Food Labels: -Group verbal and written material supporting the discussion of sodium use in heart healthy  nutrition. Review and explanation with models, verbal and written materials for utilization of the food label.   Exercise Physiology & Risk Factors: - Group verbal and written instruction with models to review the exercise physiology of the cardiovascular system and associated critical values. Details cardiovascular disease risk factors and the goals associated with each risk factor.   Aerobic Exercise & Resistance Training: - Gives group verbal and written discussion on the health impact of inactivity. On the components of aerobic and resistive training programs and the benefits of this training and how to safely progress through these programs.   Flexibility, Balance, General Exercise Guidelines: - Provides group verbal and written instruction on the benefits of flexibility and balance training programs. Provides general exercise guidelines with specific guidelines to those with heart or lung disease. Demonstration and skill practice provided.   Stress Management: - Provides group verbal and written instruction about the health risks of elevated stress, cause of high stress, and healthy ways to reduce stress.   Depression: - Provides group verbal and written instruction on the correlation between heart/lung disease and depressed mood, treatment options, and the stigmas associated with seeking treatment.   Exercise & Equipment Safety: - Individual verbal instruction and demonstration of equipment use and safety with use of the equipment.   Infection Prevention: - Provides verbal and written material to individual with discussion of infection control including proper hand washing and proper equipment cleaning during exercise session.   Falls Prevention: - Provides verbal and written material to individual with discussion of falls prevention and safety.      Pulmonary Rehab from 03/05/2015 in [place]   Date  02/18/15   Educator  LB    Instruction Review Code  2- meets goals/outcomes      Diabetes: - Individual verbal and written instruction to review signs/symptoms of diabetes, desired ranges of glucose level fasting, after meals and with exercise. Advice that pre and post exercise glucose checks will be done for 3 sessions at entry of program.   Chronic Lung Diseases: - Group verbal and written instruction to review new updates, new respiratory medications, new advancements in procedures and treatments. Provide informative websites and "800" numbers of self-education.   Lung Procedures: - Group verbal and written instruction to describe testing methods done to diagnose lung disease. Review the outcome of test results. Describe the treatment choices: Pulmonary Function Tests, ABGs and oximetry.   Energy Conservation: - Provide group verbal and written instruction for methods to conserve energy, plan and organize activities. Instruct on pacing techniques, use of adaptive equipment and posture/positioning to relieve shortness of breath.   Triggers: - Group verbal and written instruction  to review types of environmental controls: home humidity, furnaces, filters, dust mite/pet prevention, HEPA vacuums. To discuss weather changes, air quality and the benefits of nasal washing.   Exacerbations: - Group verbal and written instruction to provide: warning signs, infection symptoms, calling MD promptly, preventive modes, and value of vaccinations. Review: effective airway clearance, coughing and/or vibration techniques. Create an Sports administrator.   Oxygen: - Individual and group verbal and written instruction on oxygen therapy. Includes supplement oxygen, available portable oxygen systems, continuous and intermittent flow rates, oxygen safety, concentrators, and Medicare reimbursement for oxygen.   Respiratory Medications: - Group verbal and written instruction to review medications for lung disease. Drug class, frequency,  complications, importance of spacers, rinsing mouth after steroid MDI's, and proper cleaning methods for nebulizers.   AED/CPR: - Group verbal and written instruction with the use of models to demonstrate the basic use of the AED with the basic ABC's of resuscitation.   Breathing Retraining: - Provides individuals verbal and written instruction on purpose, frequency, and proper technique of diaphragmatic breathing and pursed-lipped breathing. Applies individual practice skills.      Pulmonary Rehab from 03/05/2015 in [place]   Date  02/18/15   Educator  LB   Instruction Review Code  2- meets goals/outcomes      Anatomy and Physiology of the Lungs: - Group verbal and written instruction with the use of models to provide basic lung anatomy and physiology related to function, structure and complications of lung disease.   Heart Failure: - Group verbal and written instruction on the basics of heart failure: signs/symptoms, treatments, explanation of ejection fraction, enlarged heart and cardiomyopathy.   Sleep Apnea: - Individual verbal and written instruction to review Obstructive Sleep Apnea. Review of risk factors, methods for diagnosing and types of masks and machines for OSA.   Anxiety: - Provides group, verbal and written instruction on the correlation between heart/lung disease and anxiety, treatment options, and management of anxiety.   Relaxation: - Provides group, verbal and written instruction about the benefits of relaxation for patients with heart/lung disease. Also provides patients with examples of relaxation techniques.      Pulmonary Rehab from 03/05/2015 in [place]   Date  03/05/15   Educator  [facility]   Instruction Review Code  2- Meets goals/outcomes      Knowledge Questionnaire Score:     Knowledge Questionnaire Score - 02/18/15 1330    Knowledge Questionnaire Score   Pre Score  -7      Personal Goals and Risk Factors at Admission:     Personal Goals and Risk Factors at Admission - 02/18/15 1330    Personal Goals and Risk Factors on Admission   Increase Aerobic Exercise and Physical Activity Yes   Intervention While in program, learn and follow the exercise prescription taught. Start at a low level workload and increase workload after able to maintain previous level for 30 minutes. Increase time before increasing intensity.  [name] wants to increase his stamina and strength. 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 He walks his yard for 10 minutes, 3 times a day.    Understand more about Heart/Pulmonary Disease. Yes   Intervention While in program utilize professionals for any questions, and attend the education sessions. Great websites to use are www.americanheart.org or www.lung.org for reliable information.  [name] has Interstitual Lung Disease and is interested in learning more about his disease and daily management  with his shortness of breath. His rheumatologist suggested increasing his imuran which may help his dyspnea.    Improve shortness of breath with ADL's Yes   Intervention While in program, learn and follow the exercise prescription taught. Start at a low level workload and increase workload ad advised by the exercise physiologist. Increase time before increasing intensity.  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   Develop more efficient breathing techniques such as purse lipped breathing and diaphragmatic breathing; and practicing self-pacing with activity Yes   Intervention While in program, learn and utilize the specific breathing techniques taught to you. Continue to practice and use the techniques as needed.  Demonstrated good technique with PLB   Increase knowledge of respiratory  medications and ability to use respiratory devices properly.  Yes   Intervention While in program learn and demonstrate appropriate use of your oxygen therapy by increasing flow with exertion, manage oxygen tank operation, including continuous and intermittent flow.  Understanding oxygen is a drug ordered by your physician.  [name] uses 2l/m [place] at night with sleep; [facility]   Diabetes Yes   Goal Blood glucose control identified by blood glucose values, HgbA1C. Participant verbalizes understanding of the signs/symptoms of hyper/hypo glycemia, proper foot care and importance of medication and nutrition plan for blood glucose control.   Intervention Provide nutrition & aerobic exercise along with prescribed medications to achieve blood glucose in normal ranges: Fasting 65-99 mg/dL   Hypertension Yes   Goal Participant will see blood pressure controlled within the values of 140/55mm/Hg or within value directed by their physician.   Intervention Provide nutrition & aerobic exercise along with prescribed medications to achieve BP 140/90 or less.      Personal Goals and Risk Factors Review:      Goals and Risk Factor Review      02/26/15 1020 02/26/15 1551         Breathing Techniques   Goals Progress/Improvement seen  Yes       Comments Voiced understanding of PLB       Diabetes   Goal  Blood glucose control identified by blood glucose values, HgbA1C. Participant verbalizes understanding of the signs/symptoms of hyper/hypo glycemia, proper foot care and importance of medication and nutrition plan for blood glucose control.      Progress seen towards goals  Unknown      Comments  Talked with [name] today about his diabetes. He stated that he has blood work about every two months. He does not know his hgbA1c rsults, His feet are bothered because of his diabetes.  Wil  research to find his labs and then talk with [name] about the reults.      Hypertension   Goal  Participant will  see blood pressure controlled within the values of 140/24mm/Hg or within value directed by their physician.      Progress seen toward goals  Yes      Comments  [name] arrives and leaves with BP in normal range. He is a new start to the program. I will follow up and review nutrition and exercise guidelines  to maintian normal BP control with [name].         Personal Goals Discharge (Final Personal Goals and Risk Factors Review):      Goals and Risk  Factor Review - 02/26/15 1551    Diabetes   Goal Blood glucose control identified by blood glucose values, HgbA1C. Participant verbalizes understanding of the signs/symptoms of hyper/hypo glycemia, proper foot care and importance of medication and nutrition plan for blood glucose control.   Progress seen towards goals Unknown   Comments Talked with [name] today about his diabetes. He stated that he has blood work about every two months. He does not know his hgbA1c rsults, His feet are bothered because of his diabetes.  Wil  research to find his labs and then talk with [name] about the reults.   Hypertension   Goal Participant will see blood pressure controlled within the values of 140/40mm/Hg or within value directed by their physician.   Progress seen toward goals Yes   Comments [name] arrives and leaves with BP in normal range. He is a new start to the program. I will follow up and review nutrition and exercise guidelines  to maintian normal BP control with [name].      ITP Comments:   Comments: 30 day review.

## 2015-03-11 ENCOUNTER — Encounter: Payer: Self-pay | Admitting: Internal Medicine

## 2015-03-12 ENCOUNTER — Encounter: Payer: Medicare Other | Admitting: *Deleted

## 2015-03-12 DIAGNOSIS — J849 Interstitial pulmonary disease, unspecified: Secondary | ICD-10-CM

## 2015-03-12 NOTE — Progress Notes (Signed)
Daily Session Note  Patient Details  Name: [name] MRN: [id number] Date of Birth: [birth year] Referring Provider:  [name], MD  Encounter Date: 03/12/2015  Check In:     Session Check In - 03/12/15 1053    Check-In   Staff Present Carson Myrtle, BS, RRT, Respiratory Therapist;Steven Way, BS, ACSM EP-C, Exercise Physiologist;Ambriel Gorelick Dillard Essex, MS, ACSM CEP, Exercise Physiologist   ER physicians immediately available to respond to emergencies [facility] immediately available ER MD   Physician(s) Clearnce Hasten and Joni Fears   Medication changes reported     No   Fall or balance concerns reported    No   Warm-up and Cool-down Performed on first and last piece of equipment   Pain Assessment   Currently in Pain? No/denies   Multiple Pain Sites No         Goals Met:  Proper associated with RPD/PD & O2 Sat Independence with exercise equipment Using PLB without cueing & demonstrates good technique Exercise tolerated well Strength training completed today  Goals Unmet:  Not Applicable  Goals Comments: Patient completed exercise prescription and all exercise goals during rehab session. The exercise was tolerated well and the patient is progressing in the program. Personal and exercise goals expected to be met in 31 more sessions. Progress on specific individualized goals will be charted in patient's ITP. Upon completion of the program the patient will be comfortable managing exercise goals and progression on their own.    Dr. Emily Filbert is Medical Director for [facility] and [facility].

## 2015-03-14 ENCOUNTER — Encounter: Payer: Medicare Other | Admitting: *Deleted

## 2015-03-14 DIAGNOSIS — J849 Interstitial pulmonary disease, unspecified: Secondary | ICD-10-CM | POA: Diagnosis not present

## 2015-03-14 NOTE — Progress Notes (Signed)
Daily Session Note  Patient Details  Name: [name] MRN: [id number] Date of Birth: 06/16/1931 Referring Provider:  Brand Males, MD  Encounter Date: 03/14/2015  Check In:     Session Check In - 03/14/15 1049    Check-In   Staff Present Frederich Cha, RRT, RCP, Respiratory Therapist;Diogenes Whirley Dillard Essex, MS, ACSM CEP, Exercise Physiologist;Susanne Bice, RN, BSN, Nikolai   ER physicians immediately available to respond to emergencies [facility] immediately available ER MD   Physician(s) Marcelene Butte and Burlene Arnt   Medication changes reported     No   Fall or balance concerns reported    No   Warm-up and Cool-down Performed on first and last piece of equipment   VAD Patient? No   Pain Assessment   Currently in Pain? No/denies   Multiple Pain Sites No           Exercise Prescription Changes - 03/14/15 1000    Exercise Review   Progression No   Response to Exercise   Symptoms None   Comments Reviewed individualized exercise prescription and made increases per departmental policy. Exercise increases were discussed with the patient and they were able to perform the new work loads without issue (no signs or symptoms).    Duration Progress to 50 minutes of aerobic without signs/symptoms of physical distress   Intensity THRR unchanged   Progression Continue progressive overload as per policy without signs/symptoms or physical distress.   Resistance Training   Training Prescription Yes   Weight 3   Reps 10-15   Interval Training   Interval Training No   Treadmill   MPH 2   Grade 0   Minutes 15   NuStep   Level 3   Watts 60   Minutes 15   REL-XR   Level 3   Watts 50   Minutes 15      Goals Met:  Proper associated with RPD/PD & O2 Sat Independence with exercise equipment Using PLB without cueing & demonstrates good technique Exercise tolerated well Personal goals reviewed Strength training completed today  Goals Unmet:  Not Applicable  Goals Comments: Patient  completed exercise prescription and all exercise goals during rehab session. The exercise was tolerated well and the patient is progressing in the program. Personal and exercise goals expected to be met in 30 more sessions. Progress on specific individualized goals will be charted in patient's ITP. Upon completion of the program the patient will be comfortable managing exercise goals and progression on their own.    Dr. Emily Filbert is Medical Director for [place] and [facility].

## 2015-03-15 ENCOUNTER — Other Ambulatory Visit: Payer: Self-pay | Admitting: Internal Medicine

## 2015-03-17 ENCOUNTER — Encounter: Payer: Self-pay | Admitting: Internal Medicine

## 2015-03-17 ENCOUNTER — Ambulatory Visit (INDEPENDENT_AMBULATORY_CARE_PROVIDER_SITE_OTHER): Payer: Medicare Other | Admitting: Internal Medicine

## 2015-03-17 VITALS — BP 120/60 | HR 61 | Temp 97.4°F | Wt 216.0 lb

## 2015-03-17 DIAGNOSIS — E1122 Type 2 diabetes mellitus with diabetic chronic kidney disease: Secondary | ICD-10-CM | POA: Diagnosis not present

## 2015-03-17 DIAGNOSIS — J9610 Chronic respiratory failure, unspecified whether with hypoxia or hypercapnia: Secondary | ICD-10-CM

## 2015-03-17 DIAGNOSIS — I5032 Chronic diastolic (congestive) heart failure: Secondary | ICD-10-CM

## 2015-03-17 DIAGNOSIS — E1140 Type 2 diabetes mellitus with diabetic neuropathy, unspecified: Secondary | ICD-10-CM

## 2015-03-17 DIAGNOSIS — N183 Chronic kidney disease, stage 3 (moderate): Secondary | ICD-10-CM

## 2015-03-17 DIAGNOSIS — I700 Atherosclerosis of aorta: Secondary | ICD-10-CM

## 2015-03-17 DIAGNOSIS — IMO0001 Reserved for inherently not codable concepts without codable children: Secondary | ICD-10-CM

## 2015-03-17 LAB — CBC WITH DIFFERENTIAL/PLATELET
BASOS ABS: 0 10*3/uL (ref 0.0–0.1)
Basophils Relative: 0.5 % (ref 0.0–3.0)
EOS ABS: 0.5 10*3/uL (ref 0.0–0.7)
Eosinophils Relative: 6 % — ABNORMAL HIGH (ref 0.0–5.0)
HEMATOCRIT: 39.1 % (ref 39.0–52.0)
Hemoglobin: 12.6 g/dL — ABNORMAL LOW (ref 13.0–17.0)
LYMPHS ABS: 1.8 10*3/uL (ref 0.7–4.0)
LYMPHS PCT: 21.9 % (ref 12.0–46.0)
MCHC: 32.4 g/dL (ref 30.0–36.0)
MCV: 85 fl (ref 78.0–100.0)
MONO ABS: 0.6 10*3/uL (ref 0.1–1.0)
Monocytes Relative: 7.2 % (ref 3.0–12.0)
NEUTROS ABS: 5.3 10*3/uL (ref 1.4–7.7)
NEUTROS PCT: 64.4 % (ref 43.0–77.0)
PLATELETS: 225 10*3/uL (ref 150.0–400.0)
RBC: 4.6 Mil/uL (ref 4.22–5.81)
RDW: 16.4 % — ABNORMAL HIGH (ref 11.5–15.5)
WBC: 8.2 10*3/uL (ref 4.0–10.5)

## 2015-03-17 LAB — LIPID PANEL
CHOLESTEROL: 153 mg/dL (ref 0–200)
HDL: 32.2 mg/dL — AB (ref >39.00)
LDL Cholesterol: 98 mg/dL (ref 0–99)
NonHDL: 120.93
TRIGLYCERIDES: 116 mg/dL (ref 0.0–149.0)
Total CHOL/HDL Ratio: 5
VLDL: 23.2 mg/dL (ref 0.0–40.0)

## 2015-03-17 LAB — COMPREHENSIVE METABOLIC PANEL
ALT: 14 U/L (ref 0–53)
AST: 20 U/L (ref 0–37)
Albumin: 4 g/dL (ref 3.5–5.2)
Alkaline Phosphatase: 41 U/L (ref 39–117)
BILIRUBIN TOTAL: 0.5 mg/dL (ref 0.2–1.2)
BUN: 22 mg/dL (ref 6–23)
CALCIUM: 9.9 mg/dL (ref 8.4–10.5)
CO2: 32 meq/L (ref 19–32)
CREATININE: 1.61 mg/dL — AB (ref 0.40–1.50)
Chloride: 98 mEq/L (ref 96–112)
GFR: 43.93 mL/min — ABNORMAL LOW (ref >60.00)
Glucose, Bld: 165 mg/dL — ABNORMAL HIGH (ref 70–99)
Potassium: 4.3 mEq/L (ref 3.5–5.1)
SODIUM: 140 meq/L (ref 135–145)
Total Protein: 7.7 g/dL (ref 6.0–8.3)

## 2015-03-17 LAB — HEMOGLOBIN A1C: Hgb A1c MFr Bld: 7.4 % — ABNORMAL HIGH (ref 4.6–6.5)

## 2015-03-17 NOTE — Assessment & Plan Note (Signed)
With mild stenosis No obvious symptoms Need to limit afterload reduction though

## 2015-03-17 NOTE — Assessment & Plan Note (Signed)
Improved functional status with pulmonary rehab

## 2015-03-17 NOTE — Assessment & Plan Note (Signed)
Hopefully still good control Due for labs and lipids also

## 2015-03-17 NOTE — Assessment & Plan Note (Signed)
Seems to be compensated No edema Weight down some

## 2015-03-17 NOTE — Progress Notes (Signed)
Subjective:    Patient ID: [name], male    DOB: Jun 05, 1931, 81 y.o.   MRN: [id number]  HPI Here for follow up of chronic medical conditions  Going to pulmonary rehab Usually twice a week He feels his functional status is improving  Taking 1 duloxetine at bedtime Couldn't tolerate higher dose or bid  AM dose was sedating Pain is better at night  Checking sugars regularly Usually 150's after eating (at rehab) No hypoglycemic reactions  No chest pain Gets palpitations at times No dizziness or syncope No edema  Arthritis is better  Continues on azathioprine 50 bid  Current Outpatient Prescriptions on File Prior to Visit  Medication Sig Dispense Refill  . amLODipine (NORVASC) 10 MG tablet TAKE 1 TABLET BY MOUTH DAILY 90 tablet 3  . aspirin EC 81 MG tablet Take 81 mg by mouth at bedtime.    [name] azaTHIOprine (IMURAN) 50 MG tablet Take 100 mg by mouth 2 (two) times daily.     . carvedilol (COREG) 12.5 MG tablet TAKE 1 TABLET BY MOUTH TWICE A DAY WITH MEALS 60 tablet 9  . clopidogrel (PLAVIX) 75 MG tablet Take 1 tablet (75 mg total) by mouth daily. 30 tablet 3  . DHA-Vitamin C-Lutein (EYE HEALTH FORMULA PO) Take 1 tablet by mouth daily.     . DULoxetine (CYMBALTA) 30 MG capsule Take 1 capsule (30 mg total) by mouth 2 (two) times daily. (Patient taking differently: Take 30 mg by mouth at bedtime. ) 60 capsule 11  . furosemide (LASIX) 40 MG tablet TAKE ONE OR TWO TABLETS BY MOUTH EVERY DAY. 60 tablet 11  . gabapentin (NEURONTIN) 300 MG capsule TAKE 3 CAPSULES BY MOUTH EVERY MORNING AAND TAKE 4-5 CAPSULES BY MOUTH EVERY NIGHT AT BEDTIME 240 capsule 0  . glucose blood (ONE TOUCH TEST STRIPS) test strip Used to test blood sugar once daily dx: 250.60 100 each 1  . isosorbide mononitrate (IMDUR) 30 MG 24 hr tablet TAKE 1 TABLET BY MOUTH DAILY 30 tablet 9  . losartan (COZAAR) 100 MG tablet TAKE 1 TABLET BY MOUTH DAILY 90 tablet 3  . metFORMIN (GLUCOPHAGE) 1000 MG tablet Take 1  tablet (1,000 mg total) by mouth 2 (two) times daily with a meal. 60 tablet 11  . Multiple Vitamin (MULTIVITAMIN WITH MINERALS) TABS tablet Take 1 tablet by mouth daily.    . nitroGLYCERIN (NITROSTAT) 0.4 MG SL tablet Place 0.4 mg under the tongue every 5 (five) minutes as needed for chest pain. Call 911 if not resolved after 3 doses    . omeprazole (PRILOSEC) 40 MG capsule Take 40 mg by mouth daily.    . ONE TOUCH LANCETS MISC Used to test blood sugar once daily dx: 250.60 100 each 1  . pravastatin (PRAVACHOL) 40 MG tablet TAKE 1 TABLET BY MOUTH DAILY 90 tablet 3   No current facility-administered medications on file prior to visit.    Allergies  Allergen Reactions  . Doxazosin Mesylate Other (See Comments)    dizziness  . Methocarbamol Rash    Past Medical History  Diagnosis Date  . Hyperlipidemia   . Hypertension   . GERD (gastroesophageal reflux disease)   . Overweight (BMI 25.0-29.9)     BMI 29  . Interstitial lung disease ([name])     NOS  . Diverticulosis   . Esophageal stricture     a. s/p dilation spring 2010  . CAD (coronary artery disease)     a. s/p CABG (2001)  b. s/p DES to RCA and cutting POBA to ostial PDA (2013)   c. s/p DES to SVG to OM2 (01/14/14)  . History of PFTs     mixed pattern on spiro. mild restn on lung volumes with near normal DLCO. Pattern can be explained by CABG scar. Fev1 2.2L/73%, ratio 68 (67), TLC 4.7/68%,RV 1.5L/55%,DLCO 79%  . Colon polyps   . Chronic kidney disease, stage III (moderate)   . Chronic diastolic CHF (congestive heart failure) ([place])     a) 09/13 ECHO- LVEF 99991111, grade 1 diastolic dysfunction, mild LA dilatation, atrial septal aneurysm, AV mobility restricted, but no sig AS by doppler; b) 09/04/08 ECHO- LVH, ef 60%, mild AS,  . Heart murmur   . Chronic lower back pain   . Dyspnea 2009 since July -Sept    05/06/08-CP[facility]-  normal effort, reduced VO2 max 20.5 /65%, reduced at 8.2/ 40%, normal breathing resetvca of 55%, submaximal heart  rate response 112/77%, flattened o2 pluse response at peak exercise-12 ml/beat @ 85%, No VQ mismatch abnormalities, All c/w CIRC Limitation  . Hiatal hernia   . Type II diabetes mellitus ([place])   . Arthritis     osteoarthritis, s/p R TKR, and digits  . RA (rheumatoid arthritis) ([name])     [name]  . Seropositive rheumatoid arthritis ([place])   . Enlarged prostate     Past Surgical History  Procedure Laterality Date  . Total knee arthroplasty Right 03/2010    [name]  . Knee arthroscopy Right 2008  . Cataract extraction w/ intraocular lens  implant, bilateral Bilateral   . Coronary artery bypass graft  11/1999    CABG X5  . Coronary stent placement  02/2012    1 stent and balloon  . Shoulder arthroscopy with open rotator cuff repair and distal clavicle acrominectomy Left 02/27/2013    Procedure: LEFT SHOULDER ARTHROSCOPY WITH MINI OPEN ROTATOR CUFF REPAIR AND SUBACROMIAL DECOMPRESSION AND DISTAL CLAVICLE RESECTION;  Surgeon: [name], MD;  Location: [facility];  Service: Orthopedics;  Laterality: Left;  . Trigger finger release Left 02/27/2013    Procedure: RELEASE TRIGGER FINGER/A-1 PULLEY;  Surgeon: [name], MD;  Location: [facility];  Service: Orthopedics;  Laterality: Left;  . Cholecystectomy open  11/2003    [name]  . Joint replacement    . Esophagogastroduodenoscopy (egd) with esophageal dilation  2010  . Cardiac catheterization  08/2004    CP- no MI, Cath- small vessell disease   . Cardiac catheterization  12/31/2011    80% distal LM, 100% native LAD, LCx and RCA, 30% prox SVG-OM, SVG-D1 normal, 99% distal, 80% ostial SVG-RCA distal to graft, LIMA-LAD normal; LVEF mildly decreased with posterior basal AK   . Coronary angioplasty with stent placement  01/03/2012    Successful DES to SVG-RCA and cutting balloon angioplasty ostial  PDA   . Coronary angioplasty with stent placement  01/14/2014    "1"  . Cardiac catheterization  2009    with patent grafts/notes  12/31/2011  . Left and right heart catheterization with coronary angiogram  12/31/2011    Procedure: LEFT AND RIGHT HEART CATHETERIZATION WITH CORONARY ANGIOGRAM;  Surgeon: Burnell Blanks, MD;  Location: [facility] LAB;  Service: Cardiovascular;;  . Percutaneous coronary stent intervention (pci-s)  12/31/2011    Procedure: PERCUTANEOUS CORONARY STENT INTERVENTION (PCI-S);  Surgeon: Burnell Blanks, MD;  Location: [facility] CATH LAB;  Service: Cardiovascular;;  . Percutaneous coronary stent intervention (pci-s) N/A 01/03/2012    Procedure: PERCUTANEOUS  CORONARY STENT INTERVENTION (PCI-S);  Surgeon: Peter M Martinique, MD;  Location: [facility] C[facility] LAB;  Service: Cardiovascular;  Laterality: N/A;  . Percutaneous coronary intervention-balloon only  01/03/2012    Procedure: PERCUTANEOUS CORONARY INTERVENTION-BALLOON ONLY;  Surgeon: Peter M Martinique, MD;  Location: [facility] CATH LAB;  Service: Cardiovascular;;  . Left and right heart catheterization with coronary angiogram N/A 01/14/2014    Procedure: LEFT AND RIGHT HEART CATHETERIZATION WITH CORONARY ANGIOGRAM;  Surgeon: Peter M Martinique, MD;  Location[facility] LAB;  Service: Cardiovascular;  Laterality: N/A;  . Hand surgery      Family History  Problem Relation Age of Onset  . COPD Mother   . Heart disease Father   . Diabetes Brother   . Colon cancer Brother 25  . Alcohol abuse Sister   . Heart attack Father   . Stroke Sister     Social History   Social History  . Marital Status: Married    Spouse Name: N/A  . Number of Children: 3  . Years of Education: N/A   Occupational History  . lawn mower     retired   Social History Main Topics  . Smoking status: Former Smoker -- 1.00 packs/day for 20 years    Types: Cigarettes    Quit date: 04/06/1963  . Smokeless tobacco: Never Used  . Alcohol Use: No     Comment: 01/01/2012 "last alcohol ~ 50 yr ago"  . Drug Use: No  . Sexual Activity: Not Currently   Other Topics Concern  . Not on file   Social  History Narrative   No living will   Requests wife as health care POA   Discussed DNR --he requests this (done 08/29/12)   Not sure about feeding tube---but might accept for some time   Review of Systems Working on healthier lifestyle Lost ~10# in [place] okay Some dysphagia--- EGD on hold due to blood thinner     Objective:   Physical Exam  Constitutional: He appears well-developed. No distress.  Neck: Normal range of motion. Neck supple. No thyromegaly present.  Cardiovascular: Normal rate, regular rhythm and intact distal pulses.  Exam reveals no gallop.   Gr 2/6 aortic systolic murmur  Pulmonary/Chest: Effort normal. No respiratory distress. He has no wheezes.  Bibasilar dry crackles  Abdominal: Soft. There is no tenderness.  Musculoskeletal: He exhibits no edema or tenderness.  Lymphadenopathy:    He has no cervical adenopathy.  Skin: No rash noted. No erythema.  No foot lesions  Psychiatric: He has a normal mood and affect. His behavior is normal.          Assessment & Plan:

## 2015-03-17 NOTE — Assessment & Plan Note (Signed)
Will recheck labs 

## 2015-03-17 NOTE — Progress Notes (Signed)
Pre visit review using our clinic review tool, if applicable. No additional management support is needed unless otherwise documented below in the visit note. 

## 2015-03-18 DIAGNOSIS — R0689 Other abnormalities of breathing: Secondary | ICD-10-CM | POA: Diagnosis not present

## 2015-03-18 DIAGNOSIS — J8410 Pulmonary fibrosis, unspecified: Secondary | ICD-10-CM | POA: Diagnosis not present

## 2015-03-19 ENCOUNTER — Encounter: Payer: Medicare Other | Admitting: *Deleted

## 2015-03-19 ENCOUNTER — Other Ambulatory Visit: Payer: Self-pay | Admitting: Cardiovascular Disease

## 2015-03-19 DIAGNOSIS — J849 Interstitial pulmonary disease, unspecified: Secondary | ICD-10-CM

## 2015-03-19 NOTE — Progress Notes (Signed)
Daily Session Note  Patient Details  Name: [name] MRN: [id number] Date of Birth: 1931/06/27 Referring Provider:  Brand Males, MD  Encounter Date: 03/19/2015  Check In:     Session Check In - 03/19/15 1030    Check-In   Staff Present Lestine Box, BS, ACSM EP-C, Exercise Physiologist;Hadleigh Felber Dillard Essex, MS, ACSM CEP, Exercise Physiologist;Laureen Owens Shark, BS, RRT, Respiratory Therapist   ER physicians immediately available to respond to emergencies [facility] immediately available ER MD   Physician(s) Jimmye Norman and Mariea Clonts   Medication changes reported     No   Fall or balance concerns reported    No   Warm-up and Cool-down Performed on first and last piece of equipment   VAD Patient? No   Pain Assessment   Currently in Pain? No/denies   Multiple Pain Sites No         Goals Met:  Proper associated with RPD/PD & O2 Sat Independence with exercise equipment Using PLB without cueing & demonstrates good technique Exercise tolerated well Strength training completed today  Goals Unmet:  Not Applicable  Goals Comments: Patient completed exercise prescription and all exercise goals during rehab session. The exercise was tolerated well and the patient is progressing in the program. Personal and exercise goals expected to be met in 29 more sessions. Progress on specific individualized goals will be charted in patient's ITP. Upon completion of the program the patient will be comfortable managing exercise goals and progression on their own.    Dr. Emily Filbert is Medical Director for [place] and [facility].

## 2015-03-20 ENCOUNTER — Encounter: Payer: Self-pay | Admitting: *Deleted

## 2015-03-21 ENCOUNTER — Ambulatory Visit (INDEPENDENT_AMBULATORY_CARE_PROVIDER_SITE_OTHER): Payer: Medicare Other

## 2015-03-21 ENCOUNTER — Encounter: Payer: Self-pay | Admitting: Podiatry

## 2015-03-21 ENCOUNTER — Encounter: Payer: Medicare Other | Admitting: *Deleted

## 2015-03-21 ENCOUNTER — Ambulatory Visit (INDEPENDENT_AMBULATORY_CARE_PROVIDER_SITE_OTHER): Payer: Medicare Other | Admitting: Podiatry

## 2015-03-21 VITALS — BP 140/70 | HR 68 | Resp 12

## 2015-03-21 DIAGNOSIS — M779 Enthesopathy, unspecified: Secondary | ICD-10-CM | POA: Diagnosis not present

## 2015-03-21 DIAGNOSIS — J849 Interstitial pulmonary disease, unspecified: Secondary | ICD-10-CM

## 2015-03-21 DIAGNOSIS — G629 Polyneuropathy, unspecified: Secondary | ICD-10-CM

## 2015-03-21 MED ORDER — TRIAMCINOLONE ACETONIDE 10 MG/ML IJ SUSP
10.0000 mg | Freq: Once | INTRAMUSCULAR | Status: AC
  Administered 2015-03-21: 10 mg

## 2015-03-21 NOTE — Progress Notes (Signed)
   Subjective:    Patient ID: [name], male    DOB: 1931/07/03, 81 y.o.   MRN: [id number]  HPI PT STATED B/L FEET HAVE BURNING SENSATION/PAIN FOR 1 YEAR. FEET ARE GETTING WORSE ESPECIALLY SITTING/STANDING. DR. Silvio Pate PRESCRIBE RX DULOXETINE-NO RELIEF.  Review of Systems  HENT: Positive for hearing loss and sneezing.   Eyes: Positive for itching.       Objective:   Physical Exam        Assessment & Plan:

## 2015-03-21 NOTE — Progress Notes (Signed)
Daily Session Note  Patient Details  Name: [name] MRN: [id number] Date of Birth: 09-16-1931 Referring Provider:  Brand Males, MD  Encounter Date: 03/21/2015  Check In:     Session Check In - 03/21/15 1215    Check-In   Staff Present Frederich Cha, RRT, RCP, Respiratory Therapist;Geryl Dohn Dillard Essex, MS, ACSM CEP, Exercise Physiologist;Mary Kellie Shropshire, RN   ER physicians immediately available to respond to emergencies [facility] immediately available ER MD   Physician(s) Jimmye Norman and Junius Finner or balance concerns reported    No   Warm-up and Cool-down Performed on first and last piece of equipment   VAD Patient? No   Pain Assessment   Currently in Pain? No/denies   Multiple Pain Sites No           Exercise Prescription Changes - 03/21/15 1200    Exercise Review   Progression Yes   Response to Exercise   Symptoms None   Comments Re-evaluated Cliff's ex. rx. and made increased in levels on the XR and NS due to increase in strength and stamina.   Duration Progress to 50 minutes of aerobic without signs/symptoms of physical distress   Intensity THRR unchanged   Progression Continue progressive overload as per policy without signs/symptoms or physical distress.   Resistance Training   Training Prescription Yes   Weight 3   Reps 10-15   Interval Training   Interval Training No   Treadmill   MPH 2   Grade 0   Minutes 15   NuStep   Level 4   Watts 60   Minutes 15   REL-XR   Level 4   Watts 60   Minutes 15      Goals Met:  Proper associated with RPD/PD & O2 Sat Independence with exercise equipment Using PLB without cueing & demonstrates good technique Exercise tolerated well Personal goals reviewed Strength training completed today  Goals Unmet:  Not Applicable  Goals Comments: Patient completed exercise prescription and all exercise goals during rehab session. The exercise was tolerated well and the patient is progressing in the program.  Personal and exercise goals expected to be met in 26 more sessions. Progress on specific individualized goals will be charted in patient's ITP. Upon completion of the program the patient will be comfortable managing exercise goals and progression on their own.    Dr. Emily Filbert is Medical Director for [place] and [facility].

## 2015-03-24 ENCOUNTER — Encounter: Payer: Medicare Other | Admitting: Respiratory Therapy

## 2015-03-24 DIAGNOSIS — J849 Interstitial pulmonary disease, unspecified: Secondary | ICD-10-CM | POA: Diagnosis not present

## 2015-03-24 NOTE — Progress Notes (Signed)
Daily Session Note  Patient Details  Name: [name] MRN: [id number] Date of Birth: [birth year] Referring Provider:  Brand Males, MD  Encounter Date: 03/24/2015  Check In:     Session Check In - 03/24/15 1048    Check-In   Staff Present Carson Myrtle, BS, RRT, Respiratory Bertis Ruddy, BS, ACSM CEP, Exercise Physiologist;Steven Way, BS, ACSM EP-C, Exercise Physiologist   ER physicians immediately available to respond to emergencies See telemetry face sheet for immediately available ER MD   Physician(s) Marcelene Butte and Schaevit   Medication changes reported     No   Fall or balance concerns reported    No   Warm-up and Cool-down Performed on first and last piece of equipment   VAD Patient? No   Pain Assessment   Currently in Pain? No/denies   Multiple Pain Sites No           Exercise Prescription Changes - 03/24/15 1000    Exercise Review   Progression Yes   Response to Exercise   Symptoms None   Comments Re-evaluated Cliff's ex. rx. and made increase in elevation on TM due to increase in strength and stamina.   Duration Progress to 50 minutes of aerobic without signs/symptoms of physical distress   Intensity THRR unchanged   Progression Continue progressive overload as per policy without signs/symptoms or physical distress.   Resistance Training   Training Prescription Yes   Weight 3   Reps 10-15   Interval Training   Interval Training No   Treadmill   MPH 2   Grade 1   Minutes 15   NuStep   Level 4   Watts 60   Minutes 15   REL-XR   Level 4   Watts 60   Minutes 15      Goals Met:  Proper associated with RPD/PD & O2 Sat Independence with exercise equipment Exercise tolerated well Personal goals reviewed Strength training completed today  Goals Unmet:  Not Applicable  Goals Comments: Reviewed individualized exercise prescription and made increases per departmental policy. Exercise increases were discussed with the patient and they  were able to perform the new work loads without issue (no signs or symptoms).     Dr. Emily Filbert is Medical Director for [place] and [facility].

## 2015-03-25 ENCOUNTER — Ambulatory Visit: Payer: Self-pay | Admitting: Podiatry

## 2015-03-28 ENCOUNTER — Encounter: Payer: Medicare Other | Admitting: *Deleted

## 2015-03-28 DIAGNOSIS — J849 Interstitial pulmonary disease, unspecified: Secondary | ICD-10-CM | POA: Diagnosis not present

## 2015-03-28 NOTE — Progress Notes (Signed)
Daily Session Note  Patient Details  Name: [name] MRN: [id number] Date of Birth: May 09, 1931 Referring Provider:  [name], MD  Encounter Date: 03/28/2015  Check In:     Session Check In - 03/28/15 1133    Check-In   Staff Present Nyoka Cowden, RN;Shontae Rosiles, RN, BSN, CCRP;Stacey Blanch Media, RRT, RCP, Respiratory Therapist   ER physicians immediately available to respond to emergencies [facility] immediately available ER MD   Physician(s) Ds: Helane Gunther   Medication changes reported     No   Fall or balance concerns reported    No   Warm-up and Cool-down Performed on first and last piece of equipment   VAD Patient? No   Pain Assessment   Currently in Pain? No/denies         Goals Met:  Exercise tolerated well Strength training completed today  Goals Unmet:  Not Applicable  Goals Comments: Doing well with exercise prescription progression.  Expected to meet program goals in 26 visits    Dr. Emily Filbert is Medical Director for [place] and [facility].

## 2015-03-30 NOTE — Progress Notes (Signed)
Subjective:     Patient ID: [name], male   DOB: 1931-06-16, 81 y.o.   MRN: [id number]  HPI patient states I been having pain in the bottom of both my feet for approximately a year and it's been getting gradually worse. States that he has tried prescription without relief   Review of Systems  All other systems reviewed and are negative.      Objective:   Physical Exam  Constitutional: He is oriented to person, place, and time.  Cardiovascular: Intact distal pulses.   Musculoskeletal: Normal range of motion.  Neurological: He is oriented to person, place, and time.  Skin: Skin is warm.  Nursing note and vitals reviewed.  neurovascular status found to be intact muscle strength adequate range of motion within normal limits with patient found to have discomfort in the plantar aspect left and right foot at the the tarsal phalangeal joint second MPJ bilateral. States it's been getting gradually worse     Assessment:      inflammatory capsulitis second MPJ left bilateral with fluid buildup    Plan:      H&P conditions reviewed with patient and discussed long-term neuropathy versus inflammatory condition. Today I did a proximal nerve block of the second MPJ bilateral carefully aspirated the joint getting out a small amount of clear fluid and injected with half cc deck some some Kenalog and applied thick plantar padding. Reappoint to recheck

## 2015-04-01 ENCOUNTER — Encounter: Payer: Self-pay | Admitting: Respiratory Therapy

## 2015-04-01 DIAGNOSIS — J849 Interstitial pulmonary disease, unspecified: Secondary | ICD-10-CM

## 2015-04-01 NOTE — Progress Notes (Signed)
Pulmonary Individual Treatment Plan  Patient Details  Name: [name] MRN: [id number] Date of Birth: 07-23-1931 Referring Provider:  Dr Brand Males  Initial Encounter Date: 02/18/2015  Visit Diagnosis: ILD (interstitial lung disease) ([place])  Patient's Home Medications on Admission:  Current outpatient prescriptions:    amLODipine (NORVASC) 10 MG tablet, TAKE 1 TABLET BY MOUTH DAILY, Disp: 90 tablet, Rfl: 3   aspirin EC 81 MG tablet, Take 81 mg by mouth at bedtime., Disp: , Rfl:    azaTHIOprine (IMURAN) 50 MG tablet, Take 100 mg by mouth 2 (two) times daily. , Disp: , Rfl:    carvedilol (COREG) 12.5 MG tablet, TAKE 1 TABLET BY MOUTH TWICE A DAY WITH MEALS, Disp: 60 tablet, Rfl: 9   clopidogrel (PLAVIX) 75 MG tablet, TAKE 1 TABLET BY MOUTH DAILY, Disp: 30 tablet, Rfl: 6   DHA-Vitamin C-Lutein (EYE HEALTH FORMULA PO), Take 1 tablet by mouth daily. , Disp: , Rfl:    DULoxetine (CYMBALTA) 30 MG capsule, Take 1 capsule (30 mg total) by mouth 2 (two) times daily. (Patient taking differently: Take 30 mg by mouth at bedtime. ), Disp: 60 capsule, Rfl: 11   furosemide (LASIX) 40 MG tablet, TAKE ONE OR TWO TABLETS BY MOUTH EVERY DAY., Disp: 60 tablet, Rfl: 11   gabapentin (NEURONTIN) 300 MG capsule, TAKE 3 CAPSULES BY MOUTH EVERY MORNING AAND TAKE 4-5 CAPSULES BY MOUTH EVERY NIGHT AT BEDTIME, Disp: 240 capsule, Rfl: 0   glucose blood (ONE TOUCH TEST STRIPS) test strip, Used to test blood sugar once daily dx: 250.60, Disp: 100 each, Rfl: 1   isosorbide mononitrate (IMDUR) 30 MG 24 hr tablet, TAKE 1 TABLET BY MOUTH DAILY, Disp: 30 tablet, Rfl: 9   losartan (COZAAR) 100 MG tablet, TAKE 1 TABLET BY MOUTH DAILY, Disp: 90 tablet, Rfl: 3   metFORMIN (GLUCOPHAGE) 1000 MG tablet, Take 1 tablet (1,000 mg total) by mouth 2 (two) times daily with a meal., Disp: 60 tablet, Rfl: 11   Multiple Vitamin (MULTIVITAMIN WITH MINERALS) TABS tablet, Take 1 tablet by mouth daily., Disp: , Rfl:     nitroGLYCERIN (NITROSTAT) 0.4 MG SL tablet, Place 0.4 mg under the tongue every 5 (five) minutes as needed for chest pain. Call 911 if not resolved after 3 doses, Disp: , Rfl:    omeprazole (PRILOSEC) 40 MG capsule, Take 40 mg by mouth daily., Disp: , Rfl:    ONE TOUCH LANCETS MISC, Used to test blood sugar once daily dx: 250.60, Disp: 100 each, Rfl: 1   pravastatin (PRAVACHOL) 40 MG tablet, TAKE 1 TABLET BY MOUTH DAILY, Disp: 90 tablet, Rfl: 3  Past Medical History: Past Medical History  Diagnosis Date   Hyperlipidemia    Hypertension    GERD (gastroesophageal reflux disease)    Overweight (BMI 25.0-29.9)     BMI 29   Interstitial lung disease ([place])     NOS   Diverticulosis    Esophageal stricture     a. s/p dilation spring 2010   CAD (coronary artery disease)     a. s/p CABG (2001)  b. s/p DES to RCA and cutting POBA to ostial PDA (2013)   c. s/p DES to SVG to OM2 (01/14/14)   History of PFTs     mixed pattern on spiro. mild restn on lung volumes with near normal DLCO. Pattern can be explained by CABG scar. Fev1 2.2L/73%, ratio 68 (67), TLC 4.7/68%,RV 1.5L/55%,DLCO 79%   Colon polyps    Chronic kidney disease, stage III (  moderate)    Chronic diastolic CHF (congestive heart failure) ([place])     a) 09/13 ECHO- LVEF 25-95%, grade 1 diastolic dysfunction, mild LA dilatation, atrial septal aneurysm, AV mobility restricted, but no sig AS by doppler; b) 09/04/08 ECHO- LVH, ef 60%, mild AS,   Heart murmur    Chronic lower back pain    Dyspnea 2009 since July -Sept    05/06/08-[facility]-  normal effort, reduced VO2 max 20.5 /65%, reduced at 8.2/ 40%, normal breathing resetvca of 55%, submaximal heart rate response 112/77%, flattened o2 pluse response at peak exercise-12 ml/beat @ 85%, No VQ mismatch abnormalities, All c/w CIRC Limitation   Hiatal hernia    Type II diabetes mellitus ([place])    Arthritis     osteoarthritis, s/p R TKR, and digits   RA (rheumatoid arthritis) ([place])      [name]   Seropositive rheumatoid arthritis ([place])    Enlarged prostate     Tobacco Use: History  Smoking status   Former Smoker -- 1.00 packs/day for 20 years   Types: Cigarettes   Quit date: 04/06/1963  Smokeless tobacco   Never Used    Labs: Recent Review Flowsheet Data    Labs for ITP Cardiac and Pulmonary Rehab Latest Ref Rng 01/14/2014 01/14/2014 03/12/2014 09/11/2014 03/17/2015   Cholestrol 0 - 200 mg/dL - - 171 - 153   LDLCALC 0 - 99 mg/dL - - 103(H) - 98   HDL >39.00 mg/dL - - 34.30(L) - 32.20(L)   Trlycerides 0.0 - 149.0 mg/dL - - 171.0(H) - 116.0   Hemoglobin A1c 4.6 - 6.5 % - - 7.4(H) 7.5(H) 7.4(H)   PHART 7.350 - 7.450 7.391 - - - -   PCO2ART 35.0 - 45.0 mmHg 50.7(H) - - - -   HCO3 20.0 - 24.0 mEq/L 30.8(H) 31.3(H) - - -   TCO2 0 - 100 mmol/L 32 33 - - -   O2SAT - 98.0 72.0 - - -         POCT Glucose      02/26/15 1550 03/05/15 1527 03/24/15 1000 03/24/15 1507     POCT Blood Glucose   Pre-Exercise 179 mg/dL       Post-Exercise 184 mg/dL       Pre-Exercise #2 245 mg/dL       Pre-Exercise #3  164 mg/dL      Post-Exercise #3  186 mg/dL      Random   158 mg/dL  Post 128; salad and chicken for breakfast --  Error charted glucose on wrong patient       ADL UCSD:     ADL UCSD      02/18/15 1330       ADL UCSD   ADL Phase Entry     SOB Score total 69     Rest 0     Walk 3     Stairs 5     Bath 1     Dress 3     Shop 3         Pulmonary Function Assessment:     Pulmonary Function Assessment - 02/18/15 1330    Pulmonary Function Tests   DLCO% 59 %   Initial Spirometry Results   FVC% 61 %   FEV1% 60 %   FEV1/FVC Ratio 76   Comments 71   Post Bronchodilator Spirometry Results   FVC% 6 %   FEV1% 56 %   Breath   Bilateral Breath Sounds Rales;Basilar   Shortness  of Breath Yes;Fear of Shortness of Breath;Limiting activity      Exercise Target Goals:    Exercise Program Goal: Individual exercise prescription set with THRR,  safety & activity barriers. Participant demonstrates ability to understand and report RPE using BORG scale, to self-measure pulse accurately, and to acknowledge the importance of the exercise prescription.  Exercise Prescription Goal: Starting with aerobic activity 30 plus minutes a day, 3 days per week for initial exercise prescription. Provide home exercise prescription and guidelines that participant acknowledges understanding prior to discharge.  Activity Barriers & Risk Stratification:     Activity Barriers & Risk Stratification - 02/18/15 1330    Activity Barriers & Risk Stratification   Activity Barriers Arthritis;Deconditioning;Muscular Weakness;Shortness of Breath   Risk Stratification Moderate      6 Minute Walk:     6 Minute Walk      02/18/15 1438       6 Minute Walk   Phase Initial     Distance 730 feet     Walk Time 5 minutes     Resting HR 73 bpm     Resting BP 124/76 mmHg     Max Ex. HR 76 bpm     Max Ex. BP 142/76 mmHg     RPE 18     Perceived Dyspnea  6     Symptoms No        Initial Exercise Prescription:     Initial Exercise Prescription - 02/18/15 1400    Date of Initial Exercise Prescription   Date 02/18/15   Treadmill   MPH 2   Grade 0   Minutes 10   Recumbant Bike   Level 2   RPM 40   Watts 20   Minutes 10   NuStep   Level 2   Watts 40   Minutes 10   Arm Ergometer   Level 1   Watts 10   Minutes 10   REL-XR   Level 2   Watts 40   Minutes 10   Prescription Details   Frequency (times per week) 3   Duration Progress to 30 minutes of continuous aerobic without signs/symptoms of physical distress   Intensity   THRR REST +  30   Ratings of Perceived Exertion 11-15   Perceived Dyspnea 2-4   Progression Continue progressive overload as per policy without signs/symptoms or physical distress.   Resistance Training   Training Prescription Yes   Weight 2   Reps 10-15      Exercise Prescription Changes:     Exercise  Prescription Changes      03/03/15 1643 03/05/15 1100 03/14/15 1000 03/21/15 1200 03/24/15 1000   Exercise Review   Progression No No No Yes Yes   Response to Exercise   Blood Pressure (Admit) 120/68 mmHg       Blood Pressure (Exercise) 138/76 mmHg       Blood Pressure (Exit) 108/64 mmHg       Heart Rate (Admit) 73 bpm       Heart Rate (Exercise) 83 bpm       Heart Rate (Exit) 73 bpm       Oxygen Saturation (Admit) 95 %       Oxygen Saturation (Exercise) 94 %       Oxygen Saturation (Exit) 97 %       Rating of Perceived Exertion (Exercise) 13       Perceived Dyspnea (Exercise) 3       Symptoms none None  None None None   Comments  Patient is still new to the program and procedures so the patient was oriented to the gym and the equipment functions and settings. Procedures and policies of the gym were outlined and explained. The patient's individual exercise prescription and treatment plan were reviewed with them. All starting workloads were established based on the results of the functional testing  done at the initial intake visit. The plan for exercise progression was also introduced and progression will be customized based on the patient's performance and goals. Reviewed individualized exercise prescription and made increases per departmental policy. Exercise increases were discussed with the patient and they were able to perform the new work loads without issue (no signs or symptoms).  Re-evaluated Cliff's ex. rx. and made increased in levels on the XR and NS due to increase in strength and stamina. Re-evaluated Cliff's ex. rx. and made increase in elevation on TM due to increase in strength and stamina.   Duration Progress to 50 minutes of aerobic without signs/symptoms of physical distress Progress to 50 minutes of aerobic without signs/symptoms of physical distress Progress to 50 minutes of aerobic without signs/symptoms of physical distress Progress to 50 minutes of aerobic without  signs/symptoms of physical distress Progress to 50 minutes of aerobic without signs/symptoms of physical distress   Intensity THRR unchanged THRR unchanged THRR unchanged THRR unchanged THRR unchanged   Progression Continue progressive overload as per policy without signs/symptoms or physical distress. Continue progressive overload as per policy without signs/symptoms or physical distress. Continue progressive overload as per policy without signs/symptoms or physical distress. Continue progressive overload as per policy without signs/symptoms or physical distress. Continue progressive overload as per policy without signs/symptoms or physical distress.   Resistance Training   Training Prescription Yes Yes Yes Yes Yes   Weight '2 3 3 3 3   '$ Reps 10-15 10-15 10-15 10-15 10-15   Interval Training   Interval Training No No No No No   Treadmill   MPH '2 2 2 2 2   '$ Grade 0 0 0 0 1   Minutes '15 15 15 15 15   '$ NuStep   Level '2 2 3 4 4   '$ Watts 40 40 60 60 60   Minutes '15 15 15 15 15   '$ REL-XR   Level '2 2 3 4 4   '$ Watts 40 40 50 60 60   Minutes '15 15 15 15 15     '$ 03/24/15 1400           Exercise Review   Progression Yes       Response to Exercise   Symptoms None       Frequency Add 2 additional days to program exercise sessions.       Duration Progress to 50 minutes of aerobic without signs/symptoms of physical distress       Intensity THRR unchanged       Progression Continue progressive overload as per policy without signs/symptoms or physical distress.       Resistance Training   Training Prescription Yes       Weight 3       Reps 10-15       Interval Training   Interval Training No       Treadmill   MPH 2       Grade 1       Minutes 15       NuStep   Level 4       Watts  60       Minutes 15       REL-XR   Level 4       Watts 60       Minutes 15          Discharge Exercise Prescription (Final Exercise Prescription Changes):     Exercise Prescription Changes - 03/24/15 1400     Exercise Review   Progression Yes   Response to Exercise   Symptoms None   Frequency Add 2 additional days to program exercise sessions.   Duration Progress to 50 minutes of aerobic without signs/symptoms of physical distress   Intensity THRR unchanged   Progression Continue progressive overload as per policy without signs/symptoms or physical distress.   Resistance Training   Training Prescription Yes   Weight 3   Reps 10-15   Interval Training   Interval Training No   Treadmill   MPH 2   Grade 1   Minutes 15   NuStep   Level 4   Watts 60   Minutes 15   REL-XR   Level 4   Watts 60   Minutes 15       Nutrition:  Target Goals: Understanding of nutrition guidelines, daily intake of sodium '1500mg'$ , cholesterol '200mg'$ , calories 30% from fat and 7% or less from saturated fats, daily to have 5 or more servings of fruits and vegetables.  Biometrics:     Pre Biometrics - 02/18/15 1450    Pre Biometrics   Height '6\' 1"'$  (1.854 m)   Weight 215 lb 6.4 oz (97.705 kg)   Waist Circumference 42.5 inches   Hip Circumference 44 inches   Waist to Hip Ratio 0.97 %   BMI (Calculated) 28.5       Nutrition Therapy Plan and Nutrition Goals:     Nutrition Therapy & Goals - 02/18/15 1330    Nutrition Therapy   Diet Mr Shelton prefers not to meet with the dietitian; eats out everyday, but he has cut back on bread, potatoes, and pasta which was recommended by his arthritis physician.      Nutrition Discharge: Rate Your Plate Scores:   Psychosocial: Target Goals: Acknowledge presence or absence of depression, maximize coping skills, provide positive support system. Participant is able to verbalize types and ability to use techniques and skills needed for reducing stress and depression.  Initial Review & Psychosocial Screening:     Initial Psych Review & Screening - 02/18/15 1330    Family Dynamics   Good Support System? Yes   Comments Mr Raelyn Ensign has good support from his 3  children, grandchildren, and wife.   Barriers   Psychosocial barriers to participate in program There are no identifiable barriers or psychosocial needs.;The patient should benefit from training in stress management and relaxation.   Screening Interventions   Interventions Encouraged to exercise      Quality of Life Scores:     Quality of Life - 02/18/15 1330    Quality of Life Scores   Health/Function Pre 24.33 %   Socioeconomic Pre 26.75 %   Psych/Spiritual Pre 28.75 %   Family Pre 25.3 %   GLOBAL Pre 25.77 %      PHQ-9:     Recent Review Flowsheet Data    Depression screen [facility] 2/9 02/18/2015 09/11/2014 09/10/2013 06/28/2012   Decreased Interest 0 0 0 0   Down, Depressed, Hopeless 0 0 0 0   PHQ - 2 Score 0 0 0 0  Psychosocial Evaluation and Intervention:     Psychosocial Evaluation - 03/05/15 1054    Psychosocial Evaluation & Interventions   Interventions Encouraged to exercise with the program and follow exercise prescription;Relaxation education   Comments Counselor met with Mr. Ethen todya for initial psychosocial evaluation.  He is an 81 year old who has been suffering with shortness of breath.   He has a strong support system with a spouse of almost 48 years and an older daughter who also lives in the same home.  Mr. Ardian is also actively involved in his local church community.  He reports having problems with his feet and his eyes as well as type 2 diabetes in addition to IPD.  He states he sleeps well and has a good appetite.  Mr. Keiton denies a history of depression or anxiety or current symptoms.  He states he is typically in a positive mood but that his health condition is his primary stressor at this time.  Mr. Dover has goals to breathe better and walk better while in this program.  He is a member at the [facility] and plans to consistently exercise there following completion of this program.     Continued Psychosocial Services Needed Yes  Mr. Belford will benefit from  the psychoeducational components of this program, particularly stress management.        Psychosocial Re-Evaluation:  Education: Education Goals: Education classes will be provided on a weekly basis, covering required topics. Participant will state understanding/return demonstration of topics presented.  Learning Barriers/Preferences:     Learning Barriers/Preferences - 02/18/15 1330    Learning Barriers/Preferences   Learning Barriers None   Learning Preferences Group Instruction;Individual Instruction;Pictoral;Skilled Demonstration;Verbal Instruction;Video;Written Material      Education Topics: Initial Evaluation Education: - Verbal, written and demonstration of respiratory meds, RPE/PD scales, oximetry and breathing techniques. Instruction on use of nebulizers and MDIs: cleaning and proper use, rinsing mouth with steroid doses and importance of monitoring MDI activations.          Pulmonary Rehab from 03/19/2015 in [place]   Date  02/18/15   Educator  LB   Instruction Review Code  2- meets goals/outcomes      General Nutrition Guidelines/Fats and Fiber: -Group instruction provided by verbal, written material, models and posters to present the general guidelines for heart healthy nutrition. Gives an explanation and review of dietary fats and fiber.   Controlling Sodium/Reading Food Labels: -Group verbal and written material supporting the discussion of sodium use in heart healthy nutrition. Review and explanation with models, verbal and written materials for utilization of the food label.   Exercise Physiology & Risk Factors: - Group verbal and written instruction with models to review the exercise physiology of the cardiovascular system and associated critical values. Details cardiovascular disease risk factors and the goals associated with each risk factor.      Pulmonary Rehab from 03/19/2015 in [place]   Date  03/12/15   Educator  SW   Instruction Review Code  2- meets goals/outcomes      Aerobic Exercise & Resistance Training: - Gives group verbal and written discussion on the health impact of inactivity. On the components of aerobic and resistive training programs and the benefits of this training and how to safely progress through these programs.   Flexibility, Balance, General Exercise Guidelines: - Provides group verbal and written instruction on the benefits of flexibility and balance training programs. Provides general exercise  guidelines with specific guidelines to those with heart or lung disease. Demonstration and skill practice provided.   Stress Management: - Provides group verbal and written instruction about the health risks of elevated stress, cause of high stress, and healthy ways to reduce stress.   Depression: - Provides group verbal and written instruction on the correlation between heart/lung disease and depressed mood, treatment options, and the stigmas associated with seeking treatment.   Exercise & Equipment Safety: - Individual verbal instruction and demonstration of equipment use and safety with use of the equipment.   Infection Prevention: - Provides verbal and written material to individual with discussion of infection control including proper hand washing and proper equipment cleaning during exercise session.   Falls Prevention: - Provides verbal and written material to individual with discussion of falls prevention and safety.      Pulmonary Rehab from 03/19/2015 in [place]   Date  02/18/15   Educator  LB   Instruction Review Code  2- meets goals/outcomes      Diabetes: - Individual verbal and written instruction to review signs/symptoms of diabetes, desired ranges of glucose level fasting, after meals and with exercise. Advice that pre and post exercise glucose checks will be done for 3  sessions at entry of program.   Chronic Lung Diseases: - Group verbal and written instruction to review new updates, new respiratory medications, new advancements in procedures and treatments. Provide informative websites and "800" numbers of self-education.      Pulmonary Rehab from 03/19/2015 in [place]   Date  03/14/15 [facility]]   Educator  CE   Instruction Review Code  2- meets goals/outcomes      Lung Procedures: - Group verbal and written instruction to describe testing methods done to diagnose lung disease. Review the outcome of test results. Describe the treatment choices: Pulmonary Function Tests, ABGs and oximetry.   Energy Conservation: - Provide group verbal and written instruction for methods to conserve energy, plan and organize activities. Instruct on pacing techniques, use of adaptive equipment and posture/positioning to relieve shortness of breath.   Triggers: - Group verbal and written instruction to review types of environmental controls: home humidity, furnaces, filters, dust mite/pet prevention, HEPA vacuums. To discuss weather changes, air quality and the benefits of nasal washing.   Exacerbations: - Group verbal and written instruction to provide: warning signs, infection symptoms, calling MD promptly, preventive modes, and value of vaccinations. Review: effective airway clearance, coughing and/or vibration techniques. Create an Sports administrator.   Oxygen: - Individual and group verbal and written instruction on oxygen therapy. Includes supplement oxygen, available portable oxygen systems, continuous and intermittent flow rates, oxygen safety, concentrators, and Medicare reimbursement for oxygen.   Respiratory Medications: - Group verbal and written instruction to review medications for lung disease. Drug class, frequency, complications, importance of spacers, rinsing mouth after steroid MDI's, and proper cleaning  methods for nebulizers.   AED/CPR: - Group verbal and written instruction with the use of models to demonstrate the basic use of the AED with the basic ABC's of resuscitation.   Breathing Retraining: - Provides individuals verbal and written instruction on purpose, frequency, and proper technique of diaphragmatic breathing and pursed-lipped breathing. Applies individual practice skills.      Pulmonary Rehab from 03/19/2015 in [place]   Date  02/18/15   Educator  LB   Instruction Review Code  2- meets goals/outcomes  Anatomy and Physiology of the Lungs: - Group verbal and written instruction with the use of models to provide basic lung anatomy and physiology related to function, structure and complications of lung disease.   Heart Failure: - Group verbal and written instruction on the basics of heart failure: signs/symptoms, treatments, explanation of ejection fraction, enlarged heart and cardiomyopathy.   Sleep Apnea: - Individual verbal and written instruction to review Obstructive Sleep Apnea. Review of risk factors, methods for diagnosing and types of masks and machines for OSA.   Anxiety: - Provides group, verbal and written instruction on the correlation between heart/lung disease and anxiety, treatment options, and management of anxiety.   Relaxation: - Provides group, verbal and written instruction about the benefits of relaxation for patients with heart/lung disease. Also provides patients with examples of relaxation techniques.      Pulmonary Rehab from 03/19/2015 in [place]   Date  03/05/15   Educator  [facility]   Instruction Review Code  2- Meets goals/outcomes      Knowledge Questionnaire Score:     Knowledge Questionnaire Score - 02/18/15 1330    Knowledge Questionnaire Score   Pre Score -7      Personal Goals and Risk Factors at Admission:     Personal Goals and Risk  Factors at Admission - 02/18/15 1330    Personal Goals and Risk Factors on Admission   Increase Aerobic Exercise and Physical Activity Yes   Intervention While in program, learn and follow the exercise prescription taught. Start at a low level workload and increase workload after able to maintain previous level for 30 minutes. Increase time before increasing intensity.  Mr Dylon wants to increase his stamina and strength. 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 He walks his yard for 10 minutes, 3 times a day.    Understand more about Heart/Pulmonary Disease. Yes   Intervention While in program utilize professionals for any questions, and attend the education sessions. Great websites to use are www.americanheart.org or www.lung.org for reliable information.  Mr Hendry has Interstitual Lung Disease and is interested in learning more about his disease and daily management with his shortness of breath. His rheumatologist suggested increasing his imuran which may help his dyspnea.    Improve shortness of breath with ADL's Yes   Intervention While in program, learn and follow the exercise prescription taught. Start at a low level workload and increase workload ad advised by the exercise physiologist. Increase time before increasing intensity.  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   Develop more efficient breathing techniques such as purse lipped breathing and diaphragmatic breathing; and practicing self-pacing with activity Yes   Intervention While in program, learn and utilize the specific breathing techniques taught to you. Continue to practice and use the techniques as needed.  Demonstrated good technique with PLB   Increase knowledge of respiratory medications and ability to use respiratory devices properly.  Yes   Intervention While in  program learn and demonstrate appropriate use of your oxygen therapy by increasing flow with exertion, manage oxygen tank operation, including continuous and intermittent flow.  Understanding oxygen is a drug ordered by your physician.  [name] uses 2l/m [place] at night with sleep; [facility]   Diabetes Yes   Goal  Blood glucose control identified by blood glucose values, HgbA1C. Participant verbalizes understanding of the signs/symptoms of hyper/hypo glycemia, proper foot care and importance of medication and nutrition plan for blood glucose control.   Intervention Provide nutrition & aerobic exercise along with prescribed medications to achieve blood glucose in normal ranges: Fasting 65-99 mg/dL   Hypertension Yes   Goal Participant will see blood pressure controlled within the values of 140/43mm/Hg or within value directed by their physician.   Intervention Provide nutrition & aerobic exercise along with prescribed medications to achieve BP 140/90 or less.      Personal Goals and Risk Factors Review:      Goals and Risk Factor Review      02/26/15 1020 02/26/15 1551 03/19/15 1000 03/24/15 1000 03/24/15 1051   Increase Aerobic Exercise and Physical Activity   Goals Progress/Improvement seen      Yes   Comments     Patient has been able to tolerate exercise increases in his exercise prescription well. He has felt an increase in strengh and stamina.    Understand more about Heart/Pulmonary Disease   Goals Progress/Improvement seen    Yes     Comments   Mr Goddard has improved with his IPF since he has stasrted taking Imuran twice a day. His arthritis is believed to have caused his lung condition. He does have routine lab work done and a yearly CT.      Improve shortness of breath with ADL's   Goals Progress/Improvement seen      No   Comments     Patient stated that his SOB is about the same as before he started. His goal is to be able to walk from the front door of the hospital to  class without having to stop due to SOB.    Breathing Techniques   Goals Progress/Improvement seen  Yes  Yes     Comments Voiced understanding of PLB  I encouraged Mr Elsass to  use PLB when on the NS and short of breath. He states he just does not remember to do the technique.     Increase knowledge of respiratory medications   Goals Progress/Improvement seen    Yes Yes    Comments   Mr Mobley is very compliant about wearing his oxygen at night. He was tested with a home sleep study and now wears 2l/m; no CPAP machine. he does maintain acceptable O2Sat's with rest and activity. Educated Mr Channing Mutters on alternative respiratory medications which Medicare will pay for for SVN use. [facility] and will [facility] in [facility] program and will supply his inhalers.    Diabetes   Goal  Blood glucose control identified by blood glucose values, HgbA1C. Participant verbalizes understanding of the signs/symptoms of hyper/hypo glycemia, proper foot care and importance of medication and nutrition plan for blood glucose control.      Progress seen towards goals  Unknown      Comments  Talked with Daron Offer today about his diabetes. He stated that he has blood work about every two months. He does not know his hgbA1c rsults, His feet are bothered because of his diabetes.  Wil  research to find his labs and then talk with Daron Offer about the reults.      Hypertension   Goal  Participant will see blood pressure controlled within the values of 140/50mm/Hg or within value directed by their physician.      Progress seen  toward goals  Yes      Comments  Cliff arrives and leaves with BP in normal range. He is a new start to the program. I will follow up and review nutrition and exercise guidelines  to maintian normal BP control with Cliff.        03/24/15 1506 03/25/15 1517 04/01/15 0730       Increase knowledge of respiratory medications   Comments Educated Mr Carloyn Manner on alternative  respiratory medications which Medicare will pay for for SVN use. Mr Carloyn Manner has an appointment with the [facility] in January and will be enrolled in their medication program and will supply his inhalers. Error - charted on another patient on Mr Sides's chart.      Diabetes   Progress seen towards goals   Yes     Comments   Mr Baumgardner has not had any issues with his glucose levels during exercise in [facility]. He is compliant with his pravastatin.     Hypertension   Progress seen toward goals   Yes     Comments   Mr Housel is maintaining acceptable BP in [facility] and is compliant with his BP medicine.        Personal Goals Discharge (Final Personal Goals and Risk Factors Review):      Goals and Risk Factor Review - 04/01/15 0730    Diabetes   Progress seen towards goals Yes   Comments Mr Pustejovsky has not had any issues with his glucose levels during exercise in [facility]. He is compliant with his pravastatin.   Hypertension   Progress seen toward goals Yes   Comments Mr Sells is maintaining acceptable BP in [facility] and is compliant with his BP medicine.      ITP Comments:     ITP Comments      03/12/15 1000 03/24/15 1048 03/28/15 1400       ITP Comments Estimated timetable to achieve ITP goals is 30 more sessions. ITP goals anticipated to be met in 25 sessions.  ITP goals anticipated to be met in 26 more sessions.       30 day note review  Comments:

## 2015-04-09 ENCOUNTER — Encounter: Payer: Medicare Other | Attending: Internal Medicine

## 2015-04-09 DIAGNOSIS — J849 Interstitial pulmonary disease, unspecified: Secondary | ICD-10-CM | POA: Insufficient documentation

## 2015-04-11 ENCOUNTER — Encounter: Payer: Medicare Other | Admitting: *Deleted

## 2015-04-11 DIAGNOSIS — J849 Interstitial pulmonary disease, unspecified: Secondary | ICD-10-CM

## 2015-04-11 NOTE — Progress Notes (Signed)
Daily Session Note  Patient Details  Name: [name] MRN: [id number] Date of Birth: [birth year] Referring Provider:  Brand Males, MD  Encounter Date: 04/11/2015  Check In:     Session Check In - 04/11/15 1048    Check-In   Staff Present Candiss Norse, MS, ACSM CEP, Exercise Physiologist;Susanne Bice, RN, BSN, CCRP;Stacey Blanch Media, RRT, RCP, Respiratory Therapist   ER physicians immediately available to respond to emergencies [facility] immediately available ER MD   Physician(s) Edd Fabian and Thomasene Lot   Medication changes reported     No   Fall or balance concerns reported    No   Warm-up and Cool-down Performed on first and last piece of equipment   VAD Patient? No   Pain Assessment   Currently in Pain? No/denies   Multiple Pain Sites No           Exercise Prescription Changes - 04/11/15 1000    Exercise Review   Progression Yes   Response to Exercise   Symptoms None   Comments [name] continues to show progression and this is now reflected in a more challlenging ex. rx. on the NS. He was able to complete this increase with no signs or symproms.   Duration Progress to 50 minutes of aerobic without signs/symptoms of physical distress   Intensity THRR unchanged   Progression Continue progressive overload as per policy without signs/symptoms or physical distress.   Resistance Training   Training Prescription Yes   Weight 3   Reps 10-15   Interval Training   Interval Training No   Treadmill   MPH 2   Grade 1   Minutes 15   NuStep   Level 4   Watts 65   Minutes 15   REL-XR   Level 4   Watts 60   Minutes 15      Goals Met:  Proper associated with RPD/PD & O2 Sat [place] with exercise equipment Using PLB without cueing & demonstrates good technique  Goals Unmet:  Not Applicable  Goals Comments: [name] had a virus last week.  His BP is on the low side of normal.  He completed the NS and only 3 mins. on the TM.  He left early due to low BP and not  feeling well.  Instructed him to drink plenty of fluids and rest this weekend.  He is expected to met his personal and exercise goals in 25 more sessions.    Dr. Emily Filbert is Medical Director for [facility] and [facility].

## 2015-04-16 ENCOUNTER — Encounter: Payer: Medicare Other | Admitting: *Deleted

## 2015-04-16 DIAGNOSIS — J849 Interstitial pulmonary disease, unspecified: Secondary | ICD-10-CM | POA: Diagnosis not present

## 2015-04-16 NOTE — Progress Notes (Signed)
Daily Session Note  Patient Details  Name: [name] MRN: [id number] Date of Birth: 07/19/1931 Referring Provider:  Brand Males, MD  Encounter Date: 04/16/2015  Check In:     Session Check In - 04/16/15 1036    Check-In   Staff Present Carson Myrtle, BS, RRT, Respiratory Therapist;Steven Way, BS, ACSM EP-C, Exercise Physiologist;Rhyli Depaula Dillard Essex, MS, ACSM CEP, Exercise Physiologist   ER physicians immediately available to respond to emergencies [facility] immediately available ER MD   Physician(s) Clearnce Hasten and Paduchowski   Medication changes reported     No   Fall or balance concerns reported    No   Warm-up and Cool-down Performed on first and last piece of equipment   VAD Patient? No   Pain Assessment   Currently in Pain? No/denies   Multiple Pain Sites No           Exercise Prescription Changes - 04/16/15 1000    Exercise Review   Progression Yes   Response to Exercise   Symptoms [name] is still recovering from a sickness in December and is feeling low energy and SOB. In lieu of the treadmill for this class session he will complete the BioStep. His workloads for this machine can be viewed below.   Duration Progress to 50 minutes of aerobic without signs/symptoms of physical distress   Intensity THRR unchanged   Progression Continue progressive overload as per policy without signs/symptoms or physical distress.   Resistance Training   Training Prescription Yes   Weight 3   Reps 10-15   Interval Training   Interval Training No   Treadmill   MPH 2   Grade 1   Minutes 15   NuStep   Level 4   Watts 65   Minutes 15   REL-XR   Level 4   Watts 60   Minutes 15   Biostep-RELP   Level 3   Watts 20   Minutes 15      Goals Met:  Proper associated with RPD/PD & O2 Sat Independence with exercise equipment Using PLB without cueing & demonstrates good technique Exercise tolerated well Personal goals reviewed Strength training completed today  Goals  Unmet:  Not Applicable  Goals Comments: Modified exercise due to recovering from sickness, still completed exercise and full time of class.    Dr. Emily Filbert is Medical Director for [facility] and [facility].

## 2015-04-18 ENCOUNTER — Encounter: Payer: Medicare Other | Admitting: *Deleted

## 2015-04-18 VITALS — Ht 73.0 in | Wt 212.0 lb

## 2015-04-18 DIAGNOSIS — J849 Interstitial pulmonary disease, unspecified: Secondary | ICD-10-CM | POA: Diagnosis not present

## 2015-04-18 DIAGNOSIS — J8410 Pulmonary fibrosis, unspecified: Secondary | ICD-10-CM | POA: Diagnosis not present

## 2015-04-18 DIAGNOSIS — R0689 Other abnormalities of breathing: Secondary | ICD-10-CM | POA: Diagnosis not present

## 2015-04-18 NOTE — Progress Notes (Signed)
Daily Session Note  Patient Details  Name: [name] MRN: [id number] Date of Birth: 1931-06-15 Referring Provider:  Brand Males, MD  Encounter Date: 04/18/2015  Check In:     Session Check In - 04/18/15 1108    Check-In   Staff Present Candiss Norse, MS, ACSM CEP, Exercise Physiologist;Susanne Bice, RN, BSN, CCRP;Stacey Blanch Media, RRT, RCP, Respiratory Therapist   ER physicians immediately available to respond to emergencies [facility] immediately available ER MD   Physician(s) Jacqualine Code and Joni Fears   Medication changes reported     No   Fall or balance concerns reported    No   Warm-up and Cool-down Performed on first and last piece of equipment   VAD Patient? No   Pain Assessment   Currently in Pain? No/denies   Multiple Pain Sites No           Exercise Prescription Changes - 04/18/15 1100    Exercise Review   Progression Yes   Response to Exercise   Symptoms None   Comments Patient is approaching graduation of the program and home exercise plans were discussed. Details of the patient's exercise prescription and what they need to do in order to continue the prescription and progress with exercise were outlined and the patient verbalized understanding. The patient plans to complete all exercise at the fitness center and an appointment was made for him for Tuesday, 04-22-15   Duration Progress to 50 minutes of aerobic without signs/symptoms of physical distress   Intensity THRR unchanged   Progression Continue progressive overload as per policy without signs/symptoms or physical distress.   Resistance Training   Training Prescription Yes   Weight 3   Reps 10-15   Interval Training   Interval Training No   Treadmill   MPH 2   Grade 1   Minutes 15   NuStep   Level 4   Watts 65   Minutes 15   REL-XR   Level 4   Watts 60   Minutes 15   Biostep-RELP   Level 3   Watts 20   Minutes 15   Home Exercise Plan   Plans to continue exercise at [place] Met:  Proper associated with RPD/PD & O2 Sat Independence with exercise equipment Using PLB without cueing & demonstrates good technique Exercise tolerated well Personal goals reviewed Strength training completed today  Goals Unmet:  Not Applicable  Goals Comments: Did post 6MW today   Dr. Emily Filbert is Medical Director for [place] and [facility].

## 2015-04-21 ENCOUNTER — Encounter: Payer: Medicare Other | Admitting: *Deleted

## 2015-04-21 DIAGNOSIS — J849 Interstitial pulmonary disease, unspecified: Secondary | ICD-10-CM

## 2015-04-21 NOTE — Progress Notes (Signed)
Daily Session Note  Patient Details  Name: CREEK GAN MRN: [id number] Date of Birth: [birth year] Referring Provider:  Brand Males, MD  Encounter Date: 04/21/2015  Check In:     Session Check In - 04/21/15 1058    Check-In   Staff Present Carson Myrtle, BS, RRT, Respiratory Bertis Ruddy, BS, ACSM CEP, Exercise Physiologist;Steven Way, BS, ACSM EP-C, Exercise Physiologist   ER physicians immediately available to respond to emergencies [facility] immediately available ER MD   Physician(s) Dr. Clearnce Hasten and Marcelene Butte   Medication changes reported     No   Fall or balance concerns reported    No   Warm-up and Cool-down Performed on first and last piece of equipment   VAD Patient? No   Pain Assessment   Currently in Pain? No/denies   Multiple Pain Sites No         Goals Met:  Proper associated with RPD/PD & O2 Sat Independence with exercise equipment Exercise tolerated well Strength training completed today  Goals Unmet:  Not Applicable  Goals Comments: Patient completed exercise prescription and all exercise goals during rehab session. The exercise was tolerated well and the patient is progressing in the program.     Dr. Emily Filbert is Medical Director for [place] [facility] and [facility].

## 2015-04-21 NOTE — Progress Notes (Signed)
Pulmonary Individual Treatment Plan  Patient Details  Name: [name] MRN: [id number] Date of Birth: 1976/10/13 Referring Provider:  Brand Males, MD  Initial Encounter Date: 02/18/2015  Visit Diagnosis: Interstitial lung disease ([place])  Patient's Home Medications on Admission:  Current outpatient prescriptions:    amLODipine (NORVASC) 10 MG tablet, TAKE 1 TABLET BY MOUTH DAILY, Disp: 90 tablet, Rfl: 3   aspirin EC 81 MG tablet, Take 81 mg by mouth at bedtime., Disp: , Rfl:    azaTHIOprine (IMURAN) 50 MG tablet, Take 100 mg by mouth 2 (two) times daily. , Disp: , Rfl:    carvedilol (COREG) 12.5 MG tablet, TAKE 1 TABLET BY MOUTH TWICE A DAY WITH MEALS, Disp: 60 tablet, Rfl: 9   clopidogrel (PLAVIX) 75 MG tablet, TAKE 1 TABLET BY MOUTH DAILY, Disp: 30 tablet, Rfl: 6   DHA-Vitamin C-Lutein (EYE HEALTH FORMULA PO), Take 1 tablet by mouth daily. , Disp: , Rfl:    DULoxetine (CYMBALTA) 30 MG capsule, Take 1 capsule (30 mg total) by mouth 2 (two) times daily. (Patient taking differently: Take 30 mg by mouth at bedtime. ), Disp: 60 capsule, Rfl: 11   furosemide (LASIX) 40 MG tablet, TAKE ONE OR TWO TABLETS BY MOUTH EVERY DAY., Disp: 60 tablet, Rfl: 11   gabapentin (NEURONTIN) 300 MG capsule, TAKE 3 CAPSULES BY MOUTH EVERY MORNING AAND TAKE 4-5 CAPSULES BY MOUTH EVERY NIGHT AT BEDTIME, Disp: 240 capsule, Rfl: 0   glucose blood (ONE TOUCH TEST STRIPS) test strip, Used to test blood sugar once daily dx: 250.60, Disp: 100 each, Rfl: 1   isosorbide mononitrate (IMDUR) 30 MG 24 hr tablet, TAKE 1 TABLET BY MOUTH DAILY, Disp: 30 tablet, Rfl: 9   losartan (COZAAR) 100 MG tablet, TAKE 1 TABLET BY MOUTH DAILY, Disp: 90 tablet, Rfl: 3   metFORMIN (GLUCOPHAGE) 1000 MG tablet, Take 1 tablet (1,000 mg total) by mouth 2 (two) times daily with a meal., Disp: 60 tablet, Rfl: 11   Multiple Vitamin (MULTIVITAMIN WITH MINERALS) TABS tablet, Take 1 tablet by mouth daily., Disp: , Rfl:     nitroGLYCERIN (NITROSTAT) 0.4 MG SL tablet, Place 0.4 mg under the tongue every 5 (five) minutes as needed for chest pain. Call 911 if not resolved after 3 doses, Disp: , Rfl:    omeprazole (PRILOSEC) 40 MG capsule, Take 40 mg by mouth daily., Disp: , Rfl:    ONE TOUCH LANCETS MISC, Used to test blood sugar once daily dx: 250.60, Disp: 100 each, Rfl: 1   pravastatin (PRAVACHOL) 40 MG tablet, TAKE 1 TABLET BY MOUTH DAILY, Disp: 90 tablet, Rfl: 3  Past Medical History: Past Medical History  Diagnosis Date   Hyperlipidemia    Hypertension    GERD (gastroesophageal reflux disease)    Overweight (BMI 25.0-29.9)     BMI 29   Interstitial lung disease ([place])     NOS   Diverticulosis    Esophageal stricture     a. s/p dilation spring 2010   CAD (coronary artery disease)     a. s/p CABG (2001)  b. s/p DES to RCA and cutting POBA to ostial PDA (2013)   c. s/p DES to SVG to OM2 (01/14/14)   History of PFTs     mixed pattern on spiro. mild restn on lung volumes with near normal DLCO. Pattern can be explained by CABG scar. Fev1 2.2L/73%, ratio 68 (67), TLC 4.7/68%,RV 1.5L/55%,DLCO 79%   Colon polyps    Chronic kidney disease, stage III (moderate)  Chronic diastolic CHF (congestive heart failure) ([place])     a) 09/13 ECHO- LVEF 63-87%, grade 1 diastolic dysfunction, mild LA dilatation, atrial septal aneurysm, AV mobility restricted, but no sig AS by doppler; b) 09/04/08 ECHO- LVH, ef 60%, mild AS,   Heart murmur    Chronic lower back pain    Dyspnea 2009 since July -Sept    05/06/08-[facility]-  normal effort, reduced VO2 max 20.5 /65%, reduced at 8.2/ 40%, normal breathing resetvca of 55%, submaximal heart rate response 112/77%, flattened o2 pluse response at peak exercise-12 ml/beat @ 85%, No VQ mismatch abnormalities, All c/w CIRC Limitation   Hiatal hernia    Type II diabetes mellitus ([place])    Arthritis     osteoarthritis, s/p R TKR, and digits   RA (rheumatoid arthritis) ([place])      [name]   Seropositive rheumatoid arthritis ([place])    Enlarged prostate     Tobacco Use: History  Smoking status   Former Smoker -- 1.00 packs/day for 20 years   Types: Cigarettes   Quit date: 04/06/1963  Smokeless tobacco   Never Used    Labs: Recent Review Flowsheet Data    Labs for ITP Cardiac and Pulmonary Rehab Latest Ref Rng 01/14/2014 01/14/2014 03/12/2014 09/11/2014 03/17/2015   Cholestrol 0 - 200 mg/dL - - 171 - 153   LDLCALC 0 - 99 mg/dL - - 103(H) - 98   HDL >39.00 mg/dL - - 34.30(L) - 32.20(L)   Trlycerides 0.0 - 149.0 mg/dL - - 171.0(H) - 116.0   Hemoglobin A1c 4.6 - 6.5 % - - 7.4(H) 7.5(H) 7.4(H)   PHART 7.350 - 7.450 7.391 - - - -   PCO2ART 35.0 - 45.0 mmHg 50.7(H) - - - -   HCO3 20.0 - 24.0 mEq/L 30.8(H) 31.3(H) - - -   TCO2 0 - 100 mmol/L 32 33 - - -   O2SAT - 98.0 72.0 - - -         POCT Glucose      02/26/15 1550 03/05/15 1527 03/24/15 1000 03/24/15 1507     POCT Blood Glucose   Pre-Exercise 179 mg/dL       Post-Exercise 184 mg/dL       Pre-Exercise #2 245 mg/dL       Pre-Exercise #3  164 mg/dL      Post-Exercise #3  186 mg/dL      Random   158 mg/dL  Post 128; salad and chicken for breakfast --  Error charted glucose on wrong patient       ADL UCSD:     ADL UCSD      02/18/15 1330 04/18/15 1537     ADL UCSD   ADL Phase Entry Exit    SOB Score total 69 14    Rest 0 0    Walk 3 2    Stairs 5 2    Bath 1 0    Dress 3 0    Shop 3 1        Pulmonary Function Assessment:     Pulmonary Function Assessment - 02/18/15 1330    Pulmonary Function Tests   DLCO% 59 %   Initial Spirometry Results   FVC% 61 %   FEV1% 60 %   FEV1/FVC Ratio 76   Comments 71   Post Bronchodilator Spirometry Results   FVC% 6 %   FEV1% 56 %   Breath   Bilateral Breath Sounds Rales;Basilar   Shortness of Breath Yes;Fear of  Shortness of Breath;Limiting activity      Exercise Target Goals:    Exercise Program Goal: Individual exercise  prescription set with THRR, safety & activity barriers. Participant demonstrates ability to understand and report RPE using BORG scale, to self-measure pulse accurately, and to acknowledge the importance of the exercise prescription.  Exercise Prescription Goal: Starting with aerobic activity 30 plus minutes a day, 3 days per week for initial exercise prescription. Provide home exercise prescription and guidelines that participant acknowledges understanding prior to discharge.  Activity Barriers & Risk Stratification:     Activity Barriers & Risk Stratification - 02/18/15 1330    Activity Barriers & Risk Stratification   Activity Barriers Arthritis;Deconditioning;Muscular Weakness;Shortness of Breath   Risk Stratification Moderate      6 Minute Walk:     6 Minute Walk      02/18/15 1438 04/18/15 1110     6 Minute Walk   Phase Initial Discharge    Distance 730 feet 1150 feet    Walk Time 5 minutes 6 minutes    Resting HR 73 bpm 73 bpm    Resting BP 124/76 mmHg 122/68 mmHg    Max Ex. HR 76 bpm 76 bpm    Max Ex. BP 142/76 mmHg 136/58 mmHg    RPE 18 15    Perceived Dyspnea  6 3    Symptoms No No       Initial Exercise Prescription:     Initial Exercise Prescription - 02/18/15 1400    Date of Initial Exercise Prescription   Date 02/18/15   Treadmill   MPH 2   Grade 0   Minutes 10   Recumbant Bike   Level 2   RPM 40   Watts 20   Minutes 10   NuStep   Level 2   Watts 40   Minutes 10   Arm Ergometer   Level 1   Watts 10   Minutes 10   REL-XR   Level 2   Watts 40   Minutes 10   Prescription Details   Frequency (times per week) 3   Duration Progress to 30 minutes of continuous aerobic without signs/symptoms of physical distress   Intensity   THRR REST +  30   Ratings of Perceived Exertion 11-15   Perceived Dyspnea 2-4   Progression Continue progressive overload as per policy without signs/symptoms or physical distress.   Resistance Training   Training  Prescription Yes   Weight 2   Reps 10-15      Exercise Prescription Changes:     Exercise Prescription Changes      03/03/15 1643 03/05/15 1100 03/14/15 1000 03/21/15 1200 03/24/15 1000   Exercise Review   Progression No No No Yes Yes   Response to Exercise   Blood Pressure (Admit) 120/68 mmHg       Blood Pressure (Exercise) 138/76 mmHg       Blood Pressure (Exit) 108/64 mmHg       Heart Rate (Admit) 73 bpm       Heart Rate (Exercise) 83 bpm       Heart Rate (Exit) 73 bpm       Oxygen Saturation (Admit) 95 %       Oxygen Saturation (Exercise) 94 %       Oxygen Saturation (Exit) 97 %       Rating of Perceived Exertion (Exercise) 13       Perceived Dyspnea (Exercise) 3       Symptoms  _0    Comments  Patient is still new to the program and procedures so the patient was oriented to the gym and the equipment functions and settings. Procedures and policies of the gym were outlined and explained. The patient's individual exercise prescription and treatment plan were reviewed with them. All starting workloads were established based on the results of the functional testing  done at the initial intake visit. The plan for exercise progression was also introduced and progression will be customized based on the patient's performance and goals. Reviewed individualized exercise prescription and made increases per departmental policy. Exercise increases were discussed with the patient and they were able to perform the new work loads without issue (no signs or symptoms).  Re-evaluated Cliff's ex. rx. and made increased in levels on the XR and NS due to increase in strength and stamina. Re-evaluated Cliff's ex. rx. and made increase in elevation on TM due to increase in strength and stamina.   Duration Progress to 50 minutes of aerobic without signs/symptoms of physical distress Progress to 50 minutes of aerobic without signs/symptoms of physical distress Progress to 50 minutes of  aerobic without signs/symptoms of physical distress Progress to 50 minutes of aerobic without signs/symptoms of physical distress Progress to 50 minutes of aerobic without signs/symptoms of physical distress   Intensity _1    Progression Continue progressive overload as per policy without signs/symptoms or physical distress. Continue progressive overload as per policy without signs/symptoms or physical distress. Continue progressive overload as per policy without signs/symptoms or physical distress. Continue progressive overload as per policy without signs/symptoms or physical distress. Continue progressive overload as per policy without signs/symptoms or physical distress.   Resistance Training   Training Prescription _2    Weight _3 Reps 10-15 10-15 10-15 10-15 10-15   Interval Training   Interval Training _4    Treadmill   MPH _5 Grade 0 0 0 0 1   Minutes _6 NuStep   Level _7 Watts 40 40 60 60 60   Minutes _8 REL-XR   Level _9 Watts 40 40 50 60 60   Minutes _10 03/24/15 1400 04/11/15 1000 04/16/15 1000 04/18/15 1100     Exercise Review   Progression Yes Yes Yes Yes    Response to Exercise   Symptoms None None [name] is still recovering from a sickness in December and is feeling low energy and SOB. In lieu of the treadmill for this class session he will complete the BioStep. His workloads for this machine can be viewed below. None    Comments  [name] continues to show progression and this is now reflected in a more challlenging ex. rx. on the NS. He was able to complete this increase with no signs or symproms.  Patient is approaching graduation of the program and home exercise plans were discussed. Details of the patient's exercise prescription and what they need to do in order to continue the prescription and  progress with exercise were outlined and the patient verbalized understanding. The patient plans to complete all exercise at the fitness center and an appointment was made for him for Tuesday, 04-22-15  Frequency Add 2 additional days to program exercise sessions.       Duration Progress to 50 minutes of aerobic without signs/symptoms of physical distress Progress to 50 minutes of aerobic without signs/symptoms of physical distress Progress to 50 minutes of aerobic without signs/symptoms of physical distress Progress to 50 minutes of aerobic without signs/symptoms of physical distress    Intensity THRR unchanged THRR unchanged THRR unchanged THRR unchanged    Progression Continue progressive overload as per policy without signs/symptoms or physical distress. Continue progressive overload as per policy without signs/symptoms or physical distress. Continue progressive overload as per policy without signs/symptoms or physical distress. Continue progressive overload as per policy without signs/symptoms or physical distress.    Resistance Training   Training Prescription Yes Yes Yes Yes    Weight _0 Reps 10-15 10-15 10-15 10-15    Interval Training   Interval Training No No No No    Treadmill   MPH _1 Grade _2 Minutes _3 NuStep   Level _4 Watts 60 65 65 65    Minutes _5 REL-XR   Level _6 Watts 60 60 60 60    Minutes _7 Biostep-RELP   Level   3 3    Watts   20 20    Minutes   15 15    Home Exercise Plan   Plans to continue exercise at    [facility]       Discharge Exercise Prescription (Final Exercise Prescription Changes):     Exercise Prescription Changes - 04/18/15 1100    Exercise Review   Progression Yes   Response to Exercise   Symptoms None   Comments Patient is approaching graduation of the program and home exercise plans were discussed. Details of the patient's exercise prescription and  what they need to do in order to continue the prescription and progress with exercise were outlined and the patient verbalized understanding. The patient plans to complete all exercise at the fitness center and an appointment was made for him for Tuesday, 04-22-15   Duration Progress to 50 minutes of aerobic without signs/symptoms of physical distress   Intensity THRR unchanged   Progression Continue progressive overload as per policy without signs/symptoms or physical distress.   Resistance Training   Training Prescription Yes   Weight 3   Reps 10-15   Interval Training   Interval Training No   Treadmill   MPH 2   Grade 1   Minutes 15   NuStep   Level 4   Watts 65   Minutes 15   REL-XR   Level 4   Watts 60   Minutes 15   Biostep-RELP   Level 3   Watts 20   Minutes 15   Home Exercise Plan   Plans to continue exercise at [facility]       Nutrition:  Target Goals: Understanding of nutrition guidelines, daily intake of sodium <1558m, cholesterol <2074m calories 30% from fat and 7% or less from saturated fats, daily to have 5 or more servings of fruits and vegetables.  Biometrics:     Pre Biometrics - 02/18/15 1450    Pre Biometrics   Height 6' 1" (1.854 m)   Weight  215 lb 6.4 oz (97.705 kg)   Waist Circumference 42.5 inches   Hip Circumference 44 inches   Waist to Hip Ratio 0.97 %   BMI (Calculated) 28.5         Post Biometrics - 04/18/15 1114     Post  Biometrics   Height '6\' 1"'$  (1.854 m)   Weight 212 lb (96.163 kg)   Waist Circumference 43 inches   Hip Circumference 42 inches   Waist to Hip Ratio 1.02 %   BMI (Calculated) 28      Nutrition Therapy Plan and Nutrition Goals:     Nutrition Therapy & Goals - 02/18/15 1330    Nutrition Therapy   Diet Mr Mohamud prefers not to meet with the dietitian; eats out everyday, but he has cut back on bread, potatoes, and pasta which was recommended by his arthritis physician.      Nutrition Discharge:  Rate Your Plate Scores:   Psychosocial: Target Goals: Acknowledge presence or absence of depression, maximize coping skills, provide positive support system. Participant is able to verbalize types and ability to use techniques and skills needed for reducing stress and depression.  Initial Review & Psychosocial Screening:     Initial Psych Review & Screening - 02/18/15 1330    Family Dynamics   Good Support System? Yes   Comments Mr Raelyn Ensign has good support from his 3 children, grandchildren, and wife.   Barriers   Psychosocial barriers to participate in program There are no identifiable barriers or psychosocial needs.;The patient should benefit from training in stress management and relaxation.   Screening Interventions   Interventions Encouraged to exercise      Quality of Life Scores:     Quality of Life - 02/18/15 1330    Quality of Life Scores   Health/Function Pre 24.33 %   Socioeconomic Pre 26.75 %   Psych/Spiritual Pre 28.75 %   Family Pre 25.3 %   GLOBAL Pre 25.77 %      PHQ-9:     Recent Review Flowsheet Data    Depression screen [facility] 2/9 04/18/2015 02/18/2015 09/11/2014 09/10/2013 06/28/2012   Decreased Interest 0 0 0 0 0   Down, Depressed, Hopeless 0 0 0 0 0   PHQ - 2 Score 0 0 0 0 0   Altered sleeping 0 - - - -   Tired, decreased energy 2 - - - -   Change in appetite 1 - - - -   Feeling bad or failure about yourself  0 - - - -   Trouble concentrating 0 - - - -   Moving slowly or fidgety/restless 0 - - - -   Suicidal thoughts 0 - - - -   PHQ-9 Score 3 - - - -   Difficult doing work/chores Not difficult at all - - - -      Psychosocial Evaluation and Intervention:     Psychosocial Evaluation - 03/05/15 1054    Psychosocial Evaluation & Interventions   Interventions Encouraged to exercise with the program and follow exercise prescription;Relaxation education   Comments Counselor met with Mr. Shi todya for initial psychosocial evaluation.  He is an 82 year  old who has been suffering with shortness of breath.   He has a strong support system with a spouse of almost 55 years and an older daughter who also lives in the same home.  Mr. Cowan is also actively involved in his local church community.  He reports having problems with his  feet and his eyes as well as type 2 diabetes in addition to IPD.  He states he sleeps well and has a good appetite.  Mr. Ryley denies a history of depression or anxiety or current symptoms.  He states he is typically in a positive mood but that his health condition is his primary stressor at this time.  Mr. Lorik has goals to breathe better and walk better while in this program.  He is a member at the [facility] and plans to consistently exercise there following completion of this program.     Continued Psychosocial Services Needed Yes  Mr. Hilary will benefit from the psychoeducational components of this program, particularly stress management.        Psychosocial Re-Evaluation:     Psychosocial Re-Evaluation      04/16/15 1126           Psychosocial Re-Evaluation   Comments Counselor follow up with Mr. Rafter today reporting this program is helping him have improved breathing with less shortness of breath, and he is able to walk further and better than before.  He admits to having a big family gathering over the holidays that left him sick about 10 days following but he has improved and has a positive outlook about continuing this program.           Education: Education Goals: Education classes will be provided on a weekly basis, covering required topics. Participant will state understanding/return demonstration of topics presented.  Learning Barriers/Preferences:     Learning Barriers/Preferences - 02/18/15 1330    Learning Barriers/Preferences   Learning Barriers None   Learning Preferences Group Instruction;Individual Instruction;Pictoral;Skilled Demonstration;Verbal Instruction;Video;Written Material       Education Topics: Initial Evaluation Education: - Verbal, written and demonstration of respiratory meds, RPE/PD scales, oximetry and breathing techniques. Instruction on use of nebulizers and MDIs: cleaning and proper use, rinsing mouth with steroid doses and importance of monitoring MDI activations.          Pulmonary Rehab from 04/16/2015 in [facility] Cardiac and Pulmonary Rehab   Date  02/18/15   Educator  LB   Instruction Review Code  2- meets goals/outcomes      General Nutrition Guidelines/Fats and Fiber: -Group instruction provided by verbal, written material, models and posters to present the general guidelines for heart healthy nutrition. Gives an explanation and review of dietary fats and fiber.   Controlling Sodium/Reading Food Labels: -Group verbal and written material supporting the discussion of sodium use in heart healthy nutrition. Review and explanation with models, verbal and written materials for utilization of the food label.   Exercise Physiology & Risk Factors: - Group verbal and written instruction with models to review the exercise physiology of the cardiovascular system and associated critical values. Details cardiovascular disease risk factors and the goals associated with each risk factor.      Pulmonary Rehab from 04/16/2015 in [facility] Cardiac and Pulmonary Rehab   Date  03/12/15   Educator  SW   Instruction Review Code  2- meets goals/outcomes      Aerobic Exercise & Resistance Training: - Gives group verbal and written discussion on the health impact of inactivity. On the components of aerobic and resistive training programs and the benefits of this training and how to safely progress through these programs.   Flexibility, Balance, General Exercise Guidelines: - Provides group verbal and written instruction on the benefits of flexibility and balance training programs. Provides general exercise guidelines with specific guidelines to those with heart  or lung  disease. Demonstration and skill practice provided.   Stress Management: - Provides group verbal and written instruction about the health risks of elevated stress, cause of high stress, and healthy ways to reduce stress.   Depression: - Provides group verbal and written instruction on the correlation between heart/lung disease and depressed mood, treatment options, and the stigmas associated with seeking treatment.      Pulmonary Rehab from 04/16/2015 in [facility] Cardiac and Pulmonary Rehab   Date  04/16/15   Educator  [facility]   Instruction Review Code  2- meets goals/outcomes      Exercise & Equipment Safety: - Individual verbal instruction and demonstration of equipment use and safety with use of the equipment.   Infection Prevention: - Provides verbal and written material to individual with discussion of infection control including proper hand washing and proper equipment cleaning during exercise session.   Falls Prevention: - Provides verbal and written material to individual with discussion of falls prevention and safety.      Pulmonary Rehab from 04/16/2015 in [facility] Cardiac and Pulmonary Rehab   Date  02/18/15   Educator  LB   Instruction Review Code  2- meets goals/outcomes      Diabetes: - Individual verbal and written instruction to review signs/symptoms of diabetes, desired ranges of glucose level fasting, after meals and with exercise. Advice that pre and post exercise glucose checks will be done for 3 sessions at entry of program.   Chronic Lung Diseases: - Group verbal and written instruction to review new updates, new respiratory medications, new advancements in procedures and treatments. Provide informative websites and "800" numbers of self-education.      Pulmonary Rehab from 04/16/2015 in [facility] Cardiac and Pulmonary Rehab   Date  03/14/15 [facility]]   Educator  CE   Instruction Review Code  2- meets goals/outcomes      Lung Procedures: - Group verbal and  written instruction to describe testing methods done to diagnose lung disease. Review the outcome of test results. Describe the treatment choices: Pulmonary Function Tests, ABGs and oximetry.   Energy Conservation: - Provide group verbal and written instruction for methods to conserve energy, plan and organize activities. Instruct on pacing techniques, use of adaptive equipment and posture/positioning to relieve shortness of breath.   Triggers: - Group verbal and written instruction to review types of environmental controls: home humidity, furnaces, filters, dust mite/pet prevention, HEPA vacuums. To discuss weather changes, air quality and the benefits of nasal washing.   Exacerbations: - Group verbal and written instruction to provide: warning signs, infection symptoms, calling MD promptly, preventive modes, and value of vaccinations. Review: effective airway clearance, coughing and/or vibration techniques. Create an Sports administrator.   Oxygen: - Individual and group verbal and written instruction on oxygen therapy. Includes supplement oxygen, available portable oxygen systems, continuous and intermittent flow rates, oxygen safety, concentrators, and Medicare reimbursement for oxygen.   Respiratory Medications: - Group verbal and written instruction to review medications for lung disease. Drug class, frequency, complications, importance of spacers, rinsing mouth after steroid MDI's, and proper cleaning methods for nebulizers.   AED/CPR: - Group verbal and written instruction with the use of models to demonstrate the basic use of the AED with the basic ABC's of resuscitation.   Breathing Retraining: - Provides individuals verbal and written instruction on purpose, frequency, and proper technique of diaphragmatic breathing and pursed-lipped breathing. Applies individual practice skills.      Pulmonary Rehab from 04/16/2015 in [facility]  Cardiac and Pulmonary Rehab   Date  02/18/15   Educator  LB    Instruction Review Code  2- meets goals/outcomes      Anatomy and Physiology of the Lungs: - Group verbal and written instruction with the use of models to provide basic lung anatomy and physiology related to function, structure and complications of lung disease.   Heart Failure: - Group verbal and written instruction on the basics of heart failure: signs/symptoms, treatments, explanation of ejection fraction, enlarged heart and cardiomyopathy.   Sleep Apnea: - Individual verbal and written instruction to review Obstructive Sleep Apnea. Review of risk factors, methods for diagnosing and types of masks and machines for OSA.   Anxiety: - Provides group, verbal and written instruction on the correlation between heart/lung disease and anxiety, treatment options, and management of anxiety.   Relaxation: - Provides group, verbal and written instruction about the benefits of relaxation for patients with heart/lung disease. Also provides patients with examples of relaxation techniques.      Pulmonary Rehab from 04/16/2015 in [facility] Cardiac and Pulmonary Rehab   Date  03/05/15   Educator  [facility]   Instruction Review Code  2- Meets goals/outcomes      Knowledge Questionnaire Score:     Knowledge Questionnaire Score - 04/18/15 1537    Knowledge Questionnaire Score   Post Score -7      Personal Goals and Risk Factors at Admission:     Personal Goals and Risk Factors at Admission - 02/18/15 1330    Personal Goals and Risk Factors on Admission   Increase Aerobic Exercise and Physical Activity Yes   Intervention While in program, learn and follow the exercise prescription taught. Start at a low level workload and increase workload after able to maintain previous level for 30 minutes. Increase time before increasing intensity.  Mr Dearius wants to increase his stamina and strength. 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 He walks his yard for 10 minutes, 3 times a day.    Understand  more about Heart/Pulmonary Disease. Yes   Intervention While in program utilize professionals for any questions, and attend the education sessions. Great websites to use are www.americanheart.org or www.lung.org for reliable information.  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 His rheumatologist suggested increasing his imuran which may help his dyspnea.    Improve shortness of breath with ADL's Yes   Intervention While in program, learn and follow the exercise prescription taught. Start at a low level workload and increase workload ad advised by the exercise physiologist. Increase time before increasing intensity.  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   Develop more efficient breathing techniques such as purse lipped breathing and diaphragmatic breathing; and practicing self-pacing with activity Yes   Intervention While in program, learn and utilize the specific breathing techniques taught to you. Continue to practice and use the techniques as needed.  Demonstrated good technique with PLB   Increase knowledge of respiratory medications and ability to use respiratory devices properly.  Yes   Intervention While in program learn and demonstrate appropriate use of your oxygen therapy by increasing flow with exertion, manage oxygen tank operation, including continuous and intermittent flow.  Understanding oxygen is  a drug ordered by your physician.  [name] uses 2l/m [place] at night with sleep; [facility]   Diabetes Yes   Goal Blood glucose control identified by blood glucose values, HgbA1C. Participant verbalizes understanding of the signs/symptoms of hyper/hypo glycemia, proper foot care and importance of medication and nutrition plan  for blood glucose control.   Intervention Provide nutrition & aerobic exercise along with prescribed medications to achieve blood glucose in normal ranges: Fasting 65-99 mg/dL   Hypertension Yes   Goal Participant will see blood pressure controlled within the values of 140/72m/Hg or within value directed by their physician.   Intervention Provide nutrition & aerobic exercise along with prescribed medications to achieve BP 140/90 or less.      Personal Goals and Risk Factors Review:      Goals and Risk Factor Review      02/26/15 1020 02/26/15 1551 03/19/15 1000 03/24/15 1000 03/24/15 1051   Increase Aerobic Exercise and Physical Activity   Goals Progress/Improvement seen      Yes   Comments     Patient has been able to tolerate exercise increases in his exercise prescription well. He has felt an increase in strengh and stamina.    Understand more about Heart/Pulmonary Disease   Goals Progress/Improvement seen    Yes     Comments   Mr MCodnerhas improved with his IPF since he has stasrted taking Imuran twice a day. His arthritis is believed to have caused his lung condition. He does have routine lab work done and a yearly CT.      Improve shortness of breath with ADL's   Goals Progress/Improvement seen      No   Comments     Patient stated that his SOB is about the same as before he started. His goal is to be able to walk from the front door of the hospital to class without having to stop due to SOB.    Breathing Techniques   Goals Progress/Improvement seen  Yes  Yes     Comments Voiced understanding of PLB  I encouraged Mr MParadisto  use PLB when on the NS and short of breath. He states he just does not remember to do the technique.     Increase knowledge of respiratory medications   Goals Progress/Improvement seen    Yes Yes    Comments   Mr MSleethis very compliant about wearing his oxygen at night. He was tested with a home sleep study and now wears 2l/m; no CPAP machine. he does  maintain acceptable O2Sat's with rest and activity. Educated Mr RCarloyn Manneron alternative respiratory medications which Medicare will pay for for SVN use. Mr RCarloyn Mannerhas an [facility] and will be enrolled in their medication program and will supply his inhalers.    Diabetes   Goal  Blood glucose control identified by blood glucose values, HgbA1C. Participant verbalizes understanding of the signs/symptoms of hyper/hypo glycemia, proper foot care and importance of medication and nutrition plan for blood glucose control.      Progress seen towards goals  Unknown      Comments  Talked with COzzie Hoyletoday about his diabetes. He stated that he has blood work about every two months. He does not know his hgbA1c rsults, His feet are bothered because of his diabetes.  Wil  research to find his labs and then talk with COzzie Hoyleabout the reults.      Hypertension  Goal  Participant will see blood pressure controlled within the values of 140/71m/Hg or within value directed by their physician.      Progress seen toward goals  Yes      Comments  Cliff arrives and leaves with BP in normal range. He is a new start to the program. I will follow up and review nutrition and exercise guidelines  to maintian normal BP control with Cliff.        03/24/15 1506 03/25/15 1517 04/01/15 0730 04/16/15 1000 04/18/15 1111   Increase Aerobic Exercise and Physical Activity   Goals Progress/Improvement seen     Yes Yes   Comments    Mr MBordasis interested in FF. He has a copay of $30.00 and is exercising independently in [place]. COzzie Hoylehad some setbacks due to a cold which left him feeling low energy. He has since recovered from that and had a significant improvement in his post 6MW test. He is a lot more confident exercising and says he feels much stronger. He has vowed that he will continue to exercise at least 2d/wk in the fitness center. [facility]. We spent significant  time reviewing his exercise goals and his plan moving forward.   Understand more about Heart/Pulmonary Disease   Goals Progress/Improvement seen     Yes    Comments    I discussed with Mr MRomasthe new research on Metformin and IPF. Mr MGasparrodoes take Metformin for his diabetes and could be helpful for his IPF.    Increase knowledge of respiratory medications   Comments Educated Mr RCarloyn Manneron alternative respiratory medications which Medicare will pay for for SVN use. Mr RCarloyn Mannerhas an [facility] and will be enrolled in their medication program and will supply his inhalers. Error - charted on another patient on Mr Dooner's chart.      Diabetes   Progress seen towards goals   Yes     Comments   Mr MGetterhas not had any issues with his glucose levels during exercise in [facility]. He is compliant with his pravastatin.     Hypertension   Progress seen toward goals   Yes     Comments   Mr MBealsis maintaining acceptable BP in [facility] and is compliant with his BP medicine.       04/21/15 1000           Improve shortness of breath with ADL's   Goals Progress/Improvement seen  Yes       Comments Mr MGeissingerstates he has improved his breathing a little, but is more confident in his manage of his shortness of breath by the acceptable O2Sat's in [facility] with exercise and by pacing himself with activites. [facility]       Breathing Techniques   Goals Progress/Improvement seen  Yes       Comments Mr MDaughtridgedoes use PLB occasionally with good technique.       Increase knowledge of respiratory medications   Goals Progress/Improvement seen  Yes;No       Diabetes   Comments Mr MMeenachhas not had any problems with his glucose levels in [facility] and is compliant with his medications.       Hypertension   Comments Mr MMatheyis maintaining acceptable BP in [facility] and is  compliant with his BP medicine.          Personal Goals Discharge (Final Personal Goals and Risk Factors Review):      Goals and Risk Factor Review - 04/21/15 1000    Improve shortness of breath with ADL's   Goals Progress/Improvement seen  Yes   Comments Mr Sprigg states he has improved his breathing a little, but is more confident in his manage of his shortness of breath by the acceptable O2Sat's in [facility] with exercise and by pacing himself with activites. He noticed  an improvement when he was last at [facility] when walking into the hospital from the parking lot.   Breathing Techniques   Goals Progress/Improvement seen  Yes   Comments Mr Christianson does use PLB occasionally with good technique.   Increase knowledge of respiratory medications   Goals Progress/Improvement seen  Yes;No   Diabetes   Comments Mr Vonbargen has not had any problems with his glucose levels in [facility] and is compliant with his medications.   Hypertension   Comments Mr Cirelli is maintaining acceptable BP in [facility] and is compliant with his BP medicine.      ITP Comments:     ITP Comments      03/12/15 1000 03/24/15 1048 03/28/15 1400 04/11/15 1051 04/11/15 1351   ITP Comments Estimated timetable to achieve ITP goals is 30 more sessions. ITP goals anticipated to be met in 25 sessions.  ITP goals anticipated to be met in 26 more sessions. Personal and exercise goals expected to be met in 25 more sessions. Progress on specific individualized goals will be charted in patient's ITP. Upon completion of the program the patient will be comfortable managing exercise goals and progression on their own.  Mr. Herman had a virus last week.  His BP is on the low side of normal.  He completed the NS and only 3 mins. on the TM.  He left early due to low BP and not feeling well.  Instructed him to drink plenty of fluids and rest this weekend.  He is expected to met his personal and exercise goals in 25 more sessions.      04/11/15 1402 04/16/15 1039 04/18/15 1108       ITP Comments Mr. Exodus had a virus last week.  His BP is on the low side of normal.  He completed the NS and only 3 mins. on the TM.  He left early due to low BP and not feeling well.  Instructed him to drink plenty of fluids and rest this weekend.  He is expected to met his personal and exercise goals in 25 more sessions. Personal and exercise goals expected to be met in 25 more sessions. Progress on specific individualized goals will be charted in patient's ITP. Upon completion of the program the patient will be comfortable managing exercise goals and progression on their own.  Personal and exercise goals expected to be met in 1 more sessions. Progress on specific individualized goals will be charted in patient's ITP. Upon completion of the program the patient will be comfortable managing exercise goals and progression on their own.  Accelerated discharge due to high copay        Comments: Due to a high co-pay each [facility] session, Mr Currie is graduating from [employer] early and will join the [employer]. Thank you for the opportunity to work with your patient in [place].

## 2015-04-24 ENCOUNTER — Ambulatory Visit (INDEPENDENT_AMBULATORY_CARE_PROVIDER_SITE_OTHER): Payer: Medicare Other | Admitting: Podiatry

## 2015-04-24 ENCOUNTER — Encounter: Payer: Self-pay | Admitting: Podiatry

## 2015-04-24 DIAGNOSIS — M779 Enthesopathy, unspecified: Secondary | ICD-10-CM | POA: Diagnosis not present

## 2015-04-24 NOTE — Progress Notes (Signed)
Subjective:     Patient ID: [name], male   DOB: April 09, 1931, 82 y.o.   MRN: [id number]  HPI patient states that improve my second joints bilateral but I'm getting some pain in the third and fourth joints bilateral and I also noted some of this is related to my nerves that may not be in   Review of Systems     Objective:   Physical Exam Neurovascular status on changed with patient having discomfort third and fourth metatarsophalangeal joints with improvement in the second which we worked on previously    Assessment:     Inflammatory capsulitis but may also have neuropathic like symptoms    Plan:     H&P condition reviewed and careful injections of the third and fourth MPJs administered 3 mg Kenalog 5 mg Xylocaine bilateral. Reappoint for [place] to recheck again and continue taking medications to help with neuropathy

## 2015-05-06 ENCOUNTER — Other Ambulatory Visit: Payer: Self-pay | Admitting: Internal Medicine

## 2015-05-14 DIAGNOSIS — Z09 Encounter for follow-up examination after completed treatment for conditions other than malignant neoplasm: Secondary | ICD-10-CM | POA: Diagnosis not present

## 2015-05-14 DIAGNOSIS — J849 Interstitial pulmonary disease, unspecified: Secondary | ICD-10-CM | POA: Diagnosis not present

## 2015-05-14 DIAGNOSIS — M79641 Pain in right hand: Secondary | ICD-10-CM | POA: Diagnosis not present

## 2015-05-14 DIAGNOSIS — M0579 Rheumatoid arthritis with rheumatoid factor of multiple sites without organ or systems involvement: Secondary | ICD-10-CM | POA: Diagnosis not present

## 2015-05-16 DIAGNOSIS — Z79899 Other long term (current) drug therapy: Secondary | ICD-10-CM | POA: Diagnosis not present

## 2015-05-19 ENCOUNTER — Other Ambulatory Visit: Payer: Self-pay | Admitting: Internal Medicine

## 2015-05-19 DIAGNOSIS — J8410 Pulmonary fibrosis, unspecified: Secondary | ICD-10-CM | POA: Diagnosis not present

## 2015-05-19 DIAGNOSIS — E119 Type 2 diabetes mellitus without complications: Secondary | ICD-10-CM | POA: Diagnosis not present

## 2015-05-19 DIAGNOSIS — R0689 Other abnormalities of breathing: Secondary | ICD-10-CM | POA: Diagnosis not present

## 2015-05-26 ENCOUNTER — Other Ambulatory Visit: Payer: Self-pay | Admitting: Internal Medicine

## 2015-05-28 DIAGNOSIS — M545 Low back pain: Secondary | ICD-10-CM | POA: Diagnosis not present

## 2015-05-28 DIAGNOSIS — M47816 Spondylosis without myelopathy or radiculopathy, lumbar region: Secondary | ICD-10-CM | POA: Diagnosis not present

## 2015-05-28 DIAGNOSIS — M4806 Spinal stenosis, lumbar region: Secondary | ICD-10-CM | POA: Diagnosis not present

## 2015-06-03 ENCOUNTER — Telehealth: Payer: Self-pay

## 2015-06-03 DIAGNOSIS — E1142 Type 2 diabetes mellitus with diabetic polyneuropathy: Secondary | ICD-10-CM

## 2015-06-03 NOTE — Telephone Encounter (Signed)
Pt left v/m; pt has been seeing podiatrist about his feet and the podiatrist told pt needs to see neurologist; ? Neuropathy. Pt last f/y 03/17/15. Pt request cb with referral appt.

## 2015-06-04 NOTE — Telephone Encounter (Signed)
.  left message to have patient return my call.  

## 2015-06-04 NOTE — Telephone Encounter (Signed)
Spoke with patient and he states that even after the foot dr gave him injections in his feet, he's still having tingling and burning in his feet and that's why he wanted to see the neurologist.

## 2015-06-04 NOTE — Telephone Encounter (Signed)
We have been dealing with his neuropathy for a long time. I don't know why we would need neurologist now--usually if there is uncertainty about the diagnosis. If nothing has changed, not sure he needs neurologist

## 2015-06-05 NOTE — Telephone Encounter (Signed)
Left message on machine with results.

## 2015-06-05 NOTE — Telephone Encounter (Signed)
Let him know I put in a referral for him to meet with a neurologist to see if they have any other ideas for treatment

## 2015-06-13 ENCOUNTER — Encounter: Payer: Self-pay | Admitting: Neurology

## 2015-06-13 ENCOUNTER — Ambulatory Visit (INDEPENDENT_AMBULATORY_CARE_PROVIDER_SITE_OTHER): Payer: Medicare Other | Admitting: Neurology

## 2015-06-13 VITALS — BP 148/88 | HR 67 | Ht 73.0 in | Wt 214.1 lb

## 2015-06-13 DIAGNOSIS — E0842 Diabetes mellitus due to underlying condition with diabetic polyneuropathy: Secondary | ICD-10-CM

## 2015-06-13 DIAGNOSIS — M0550 Rheumatoid polyneuropathy with rheumatoid arthritis of unspecified site: Secondary | ICD-10-CM

## 2015-06-13 MED ORDER — DESIPRAMINE HCL 10 MG PO TABS: ORAL_TABLET | ORAL | Status: DC

## 2015-06-13 NOTE — Progress Notes (Signed)
[facility] Clinic Note - Initial Visit   Date: 06/13/2015  [name] MRN: [id number] DOB: 06/24/1931   Dear Dr. Silvio Pate:  Thank you for your kind referral of [name] for consultation of diabetic neuropathy. Although his history is well known to you, please allow [place] to reiterate it for the purpose of our medical record. The patient was accompanied to the clinic by self.    History of Present Illness: [name] is a 82 y.o. right-handed Caucasian male with CAD, GERD, interstitial lung disease, rheumatoid arthritis (on imuran), aortic stenosis, CKD stage 3, hyperlipidemia, and hypertension  presenting for evaluation of worsening burning pain of the feet.    He was diagnosed with diabetes about 10-15 years ago and recall havinghaving burning pain involving the toes.  Since then, there has been gradual worsening in intensity of pain and it now also involves the dorsum of the foot.  There is no weakness, imbalance, or gait difficulty.  He still walks independently and denies any falls.  He takes gabapentin 900mg  in the morning, 600mg  in the afternoon, and 600mg  at bedtime which provides about 50% relief. He has previously tried nortriptyline but does not recall whether it did not work or stopped it because of side effects. He was tried on Cymbalta but this made him very dizzy and sleepy.  He has seen podiatry for this and had steroid injections for capsulitis which did not provide much relief.  He takes imuran for rheumatoid arthritis.   He has chronic low back pain and gets ESI several times per year.    Out-side paper records, electronic medical record, and images have been reviewed where available and summarized as:  Lab Results  Component Value Date   TSH 2.51 08/17/2011   Lab Results  Component Value Date   HGBA1C 7.4* 03/17/2015   Lab Results  Component Value Date   [id number] 10/14/2010    Past Medical History  Diagnosis Date  .  Hyperlipidemia   . Hypertension   . GERD (gastroesophageal reflux disease)   . Overweight (BMI 25.0-29.9)     BMI 29  . Interstitial lung disease ([name])     NOS  . Diverticulosis   . Esophageal stricture     a. s/p dilation spring 2010  . CAD (coronary artery disease)     a. s/p CABG (2001)  b. s/p DES to RCA and cutting POBA to ostial PDA (2013)   c. s/p DES to SVG to OM2 (01/14/14)  . History of PFTs     mixed pattern on spiro. mild restn on lung volumes with near normal DLCO. Pattern can be explained by CABG scar. Fev1 2.2L/73%, ratio 68 (67), TLC 4.7/68%,RV 1.5L/55%,DLCO 79%  . Colon polyps   . Chronic kidney disease, stage III (moderate)   . Chronic diastolic CHF (congestive heart failure) ([place])     a) 09/13 ECHO- LVEF [id number], grade 1 diastolic dysfunction, mild LA dilatation, atrial septal aneurysm, AV mobility restricted, but no sig AS by doppler; b) 09/04/08 ECHO- LVH, ef 60%, mild AS,  . Heart murmur   . Chronic lower back pain   . Dyspnea 2009 since July -Sept    05/06/08-[facility]-  normal effort, reduced VO2 max 20.5 /65%, reduced at 8.2/ 40%, normal breathing resetvca of 55%, submaximal heart rate response 112/77%, flattened o2 pluse response at peak exercise-12 ml/beat @ 85%, No VQ mismatch abnormalities, All c/w CIRC Limitation  . Hiatal hernia   . Type  II diabetes mellitus ([place])   . Arthritis     osteoarthritis, s/p R TKR, and digits  . RA (rheumatoid arthritis) ([name])     [name]  . Seropositive rheumatoid arthritis ([place])   . Enlarged prostate     Past Surgical History  Procedure Laterality Date  . Total knee arthroplasty Right 03/2010    [name]  . Knee arthroscopy Right 2008  . Cataract extraction w/ intraocular lens  implant, bilateral Bilateral   . Coronary artery bypass graft  11/1999    CABG X5  . Coronary stent placement  02/2012    1 stent and balloon  . Shoulder arthroscopy with open rotator cuff repair and distal clavicle acrominectomy Left 02/27/2013      Procedure: LEFT SHOULDER ARTHROSCOPY WITH MINI OPEN ROTATOR CUFF REPAIR AND SUBACROMIAL DECOMPRESSION AND DISTAL CLAVICLE RESECTION;  Surgeon: [name], MD;  Location: [facility];  Service: Orthopedics;  Laterality: Left;  . Trigger finger release Left 02/27/2013    Procedure: RELEASE TRIGGER FINGER/A-1 PULLEY;  Surgeon: [name], MD;  Location: [facility];  Service: Orthopedics;  Laterality: Left;  . Cholecystectomy open  11/2003    [name]  . Joint replacement    . Esophagogastroduodenoscopy (egd) with esophageal dilation  2010  . Cardiac catheterization  08/2004    CP- no MI, Cath- small vessell disease   . Cardiac catheterization  12/31/2011    80% distal LM, 100% native LAD, LCx and RCA, 30% prox SVG-OM, SVG-D1 normal, 99% distal, 80% ostial SVG-RCA distal to graft, LIMA-LAD normal; LVEF mildly decreased with posterior basal AK   . Coronary angioplasty with stent placement  01/03/2012    Successful DES to SVG-RCA and cutting balloon angioplasty ostial  PDA   . Coronary angioplasty with stent placement  01/14/2014    "1"  . Cardiac catheterization  2009    with patent grafts/notes 12/31/2011  . Left and right heart catheterization with coronary angiogram  12/31/2011    Procedure: LEFT AND RIGHT HEART CATHETERIZATION WITH CORONARY ANGIOGRAM;  Surgeon: Burnell Blanks, MD;  Location: [facility] CATH LAB;  Service: Cardiovascular;;  . Percutaneous coronary stent intervention (pci-s)  12/31/2011    Procedure: PERCUTANEOUS CORONARY STENT INTERVENTION (PCI-S);  Surgeon: Burnell Blanks, MD;  Location: [facility] CATH LAB;  Service: Cardiovascular;;  . Percutaneous coronary stent intervention (pci-s) N/A 01/03/2012    Procedure: PERCUTANEOUS CORONARY STENT INTERVENTION (PCI-S);  Surgeon: Peter M Martinique, MD;  Location: [facility] CATH LAB;  Service: Cardiovascular;  Laterality: N/A;  . Percutaneous coronary intervention-balloon only  01/03/2012    Procedure: PERCUTANEOUS CORONARY INTERVENTION-BALLOON  ONLY;  Surgeon: Peter M Martinique, MD;  Location: [facility] CATH LAB;  Service: Cardiovascular;;  . Left and right heart catheterization with coronary angiogram N/A 01/14/2014    Procedure: LEFT AND RIGHT HEART CATHETERIZATION WITH CORONARY ANGIOGRAM;  Surgeon: Peter M Martinique, MD;  Location: [facility] CATH LAB;  Service: Cardiovascular;  Laterality: N/A;  . Hand surgery       Medications:  Outpatient Encounter Prescriptions as of 06/13/2015  Medication Sig  . amLODipine (NORVASC) 10 MG tablet TAKE 1 TABLET BY MOUTH DAILY  . aspirin EC 81 MG tablet Take 81 mg by mouth at bedtime.  [name] azaTHIOprine (IMURAN) 50 MG tablet Take 100 mg by mouth 2 (two) times daily.   . carvedilol (COREG) 12.5 MG tablet TAKE 1 TABLET BY MOUTH TWICE A DAY WITH MEALS  . clopidogrel (PLAVIX) 75 MG tablet TAKE 1 TABLET BY MOUTH DAILY  .  DHA-Vitamin C-Lutein (EYE HEALTH FORMULA PO) Take 1 tablet by mouth daily.   . DULoxetine (CYMBALTA) 30 MG capsule Take 1 capsule (30 mg total) by mouth 2 (two) times daily. (Patient taking differently: Take 30 mg by mouth at bedtime. )  . furosemide (LASIX) 40 MG tablet TAKE ONE OR TWO TABLETS BY MOUTH EVERY DAY.  [name] gabapentin (NEURONTIN) 300 MG capsule TAKE 3 CAPSULES BY MOUTH EVERY MORNING AND TAKE 4-5 CAPSULES BY MOUTHEVERY NIGHT AT BEDTIME (Patient taking differently: TAKE 3 CAPSULES BY MOUTH EVERY MORNING 2 capsules at lunch and 2 at night)  . glucose blood (ONE TOUCH TEST STRIPS) test strip Used to test blood sugar once daily dx: 250.60  . isosorbide mononitrate (IMDUR) 30 MG 24 hr tablet TAKE 1 TABLET BY MOUTH DAILY  . losartan (COZAAR) 100 MG tablet TAKE 1 TABLET BY MOUTH DAILY  . metFORMIN (GLUCOPHAGE) 1000 MG tablet Take 1 tablet (1,000 mg total) by mouth 2 (two) times daily with a meal.  . Multiple Vitamin (MULTIVITAMIN WITH MINERALS) TABS tablet Take 1 tablet by mouth daily.  . nitroGLYCERIN (NITROSTAT) 0.4 MG SL tablet Place 0.4 mg under the tongue every 5 (five) minutes as needed for chest  pain. Call 911 if not resolved after 3 doses  . omeprazole (PRILOSEC) 40 MG capsule Take 40 mg by mouth daily.  . ONE TOUCH LANCETS MISC Used to test blood sugar once daily dx: 250.60  . pravastatin (PRAVACHOL) 40 MG tablet TAKE 1 TABLET BY MOUTH DAILY   No facility-administered encounter medications on file as of 06/13/2015.     Allergies:  Allergies  Allergen Reactions  . Doxazosin Mesylate Other (See Comments)    dizziness  . Methocarbamol Rash    Family History: Family History  Problem Relation Age of Onset  . COPD Mother   . Heart disease Father   . Diabetes Brother   . Colon cancer Brother 25  . Alcohol abuse Sister   . Heart attack Father   . Stroke Sister     Social History: Social History  Substance Use Topics  . Smoking status: Former Smoker -- 1.00 packs/day for 20 years    Types: Cigarettes    Quit date: 04/06/1963  . Smokeless tobacco: Never Used  . Alcohol Use: No     Comment: 01/01/2012 "last alcohol ~ 50 yr ago"   Social History   Social History Narrative   No living will   Requests wife as health care POA   Discussed DNR --he requests this (done 08/29/12)   Not sure about feeding tube---but might accept for some time   Patient lives with wife and daughter in a one story home.  Has 3 children.  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    Review of Systems:  CONSTITUTIONAL: No fevers, chills, night sweats, or weight loss.   EYES: No visual changes or eye pain ENT: No hearing changes.  No history of nose bleeds.   RESPIRATORY: No cough, wheezing and shortness of breath.   CARDIOVASCULAR: Negative for chest pain, and palpitations.   GI: Negative for abdominal discomfort, blood in stools or black stools.  No recent change in bowel habits.   GU:  No history of incontinence.   MUSCLOSKELETAL: +history of joint pain or swelling.  No myalgias.   SKIN: Negative for lesions, rash, and itching.   HEMATOLOGY/ONCOLOGY: Negative for prolonged  bleeding, bruising easily, and swollen nodes.  No history of cancer.   ENDOCRINE: Negative for cold  or heat intolerance, polydipsia or goiter.   PSYCH:  No depression or anxiety symptoms.   NEURO: As Above.   Vital Signs:  BP 148/88 mmHg  Pulse 67  Ht 6\' 1"  (1.854 m)  Wt 214 lb 1 oz (97.098 kg)  BMI 28.25 kg/m2  SpO2 94%   General Medical Exam:   General:  Well appearing, comfortable.   Eyes/ENT: see cranial nerve examination.   Neck: No masses appreciated.  Full range of motion without tenderness.  No carotid bruits. Respiratory:  Clear to auscultation, good air entry bilaterally.   Cardiac:  Harsh systolic murmur   Extremities: mild hand deformity.  Skin:  No rashes or lesions.  Neurological Exam: MENTAL STATUS including orientation to time, place, person, recent and remote memory, attention span and concentration, language, and fund of knowledge is normal.  Speech is not dysarthric.  CRANIAL NERVES: II:  No visual field defects.  Unremarkable fundi.   III-IV-VI: Pupils equal round and reactive to light.  Normal conjugate, extra-ocular eye movements in all directions of gaze.  No nystagmus.  No ptosis.   V:  Normal facial sensation.     VII:  Normal facial symmetry and movements.  No pathologic facial reflexes.  VIII:  Normal hearing and vestibular function.   IX-X:  Normal palatal movement.   XI:  Normal shoulder shrug and head rotation.   XII:  Normal tongue strength and range of motion, no deviation or fasciculation.  MOTOR:  No atrophy, fasciculations or abnormal movements.  No pronator drift.  Tone is normal.    Right Upper Extremity:    Left Upper Extremity:    Deltoid  5/5   Deltoid  5/5   Biceps  5/5   Biceps  5/5   Triceps  5/5   Triceps  5/5   Wrist extensors  5/5   Wrist extensors  5/5   Wrist flexors  5/5   Wrist flexors  5/5   Finger extensors  5/5   Finger extensors  5/5   Finger flexors  5/5   Finger flexors  5/5   Dorsal interossei  5/5   Dorsal  interossei  5/5   Abductor pollicis  5/5   Abductor pollicis  5/5   Tone (Ashworth scale)  0  Tone (Ashworth scale)  0   Right Lower Extremity:    Left Lower Extremity:    Hip flexors  5/5   Hip flexors  5/5   Hip extensors  5/5   Hip extensors  5/5   Knee flexors  5/5   Knee flexors  5/5   Knee extensors  5/5   Knee extensors  5/5   Dorsiflexors  5/5   Dorsiflexors  5/5   Plantarflexors  5/5   Plantarflexors  5/5   Toe extensors  5/5   Toe extensors  5/5   Toe flexors  5/5   Toe flexors  5/5   Tone (Ashworth scale)  0  Tone (Ashworth scale)  0   MSRs:  Right                                                                 Left brachioradialis 2+  brachioradialis 2+  biceps 2+  biceps 2+  triceps 2+  triceps 2+  patellar 2+  patellar 2+  ankle jerk 1+  ankle jerk 1+  Hoffman no  Hoffman no  plantar response down  plantar response down   SENSORY:  Temperature, vibration, and pin prick reduced distal to ankles bilaterally.  Mild sway with Romberg's testing.   COORDINATION/GAIT: Normal finger-to- nose-finger.  Intact rapid alternating movements bilaterally.  Gait narrow based and stable, unassisted.  He is unsteady with tandem gait.   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  He is already taking gabapentin 900-600-600 and with his CKD, I would not increase this further.  - Previously tried medications:  Cymbalta, nortriptyline - Start desipramine 10mg  at bedtime x 2 weeks, then increase to 20mg  at bedtime.  Side effects discussed, QTc from 12/2014 is 437. - Try OTC Aspercream to feet.   - Going forward consider carbamazepine, lidocaine ointemnt, alpha lipoic acid - Encouraged tight glycemic control  Return to clinic in 3 months.   The duration of this appointment visit was 40 minutes of face-to-face time with the patient.  Greater than 50% of this time was  spent in counseling, explanation of diagnosis, planning of further management, and coordination of care.   Thank you for allowing me to participate in patient's care.  If I can answer any additional questions, I would be pleased to do so.    Sincerely,    Donika K. Posey Pronto, DO

## 2015-06-13 NOTE — Patient Instructions (Signed)
1.  Start desipramine 10mg  at bedtime for two weeks, then increase to 2 tablets (20mg ) at bedtime 2.  You can try Aspercream over the counter ointment for your feet 3.  Return to clinic in 3 months

## 2015-06-16 DIAGNOSIS — J8410 Pulmonary fibrosis, unspecified: Secondary | ICD-10-CM | POA: Diagnosis not present

## 2015-06-16 DIAGNOSIS — R0689 Other abnormalities of breathing: Secondary | ICD-10-CM | POA: Diagnosis not present

## 2015-06-18 DIAGNOSIS — M791 Myalgia: Secondary | ICD-10-CM | POA: Diagnosis not present

## 2015-06-18 DIAGNOSIS — M0579 Rheumatoid arthritis with rheumatoid factor of multiple sites without organ or systems involvement: Secondary | ICD-10-CM | POA: Diagnosis not present

## 2015-06-18 DIAGNOSIS — M47815 Spondylosis without myelopathy or radiculopathy, thoracolumbar region: Secondary | ICD-10-CM | POA: Diagnosis not present

## 2015-06-18 DIAGNOSIS — M47812 Spondylosis without myelopathy or radiculopathy, cervical region: Secondary | ICD-10-CM | POA: Diagnosis not present

## 2015-06-24 ENCOUNTER — Other Ambulatory Visit: Payer: Self-pay | Admitting: Internal Medicine

## 2015-06-27 NOTE — Progress Notes (Signed)
Patient ID: [name], male   DOB: [birth year], 82 y.o.   MRN: [id number]   [name] is a 82 y.o. . male with a hx of CAD: s/p CABG (2001): s/p DES to RCA and cutting POBA to ostial PDA (2013), interstitial lung disease, mild aortic stenosis, HTN, HL, DM2, CKD, rheumatoid arthritis and chronic dyspnea who was  admitted to [facility] on 01/14/14 for planned LHC after an abnormal Lexiscan myoview. He underwent successful placement of DES to SVG to OM2.  Studies: - LHC (01/14/14): Svr 3V CAD, patent LIMA to LAD, patent SVG to OM2 but mod-sev stent of prox SVG, new occluded SVG to Diag, new occluded SVG to RCA. S/p DES to SVG to OM2  - Echo (10/2013): Mild LVH, EF 60-65%, normal wall motion, grade 1 diastolic dysfunction, mild aortic stenosis (mean 14 mm Hg), normal RV function  - Nuclear (01/08/14): High risk stress nuclear study with two perfusion defects: 1. Medium size severe severity ischemia in the mid LAD territory (SDS 8) and 2. Large scar in the entire inferior, and basal and mid inferolateral walls. [name]   Dyspnea improved off Brillinta  Over a year since his stent so will stop plavix  Has clinical ILD and seeing pulmonary  Imuran increased for arthritis after seeing rheumatology at [facility] and has cured 99% of his chest pain according to patient   ROS: Denies fever, malais, weight loss, blurry vision, decreased visual acuity, cough, sputum, SOB, hemoptysis, pleuritic pain, palpitaitons, heartburn, abdominal pain, melena, lower extremity edema, claudication, or rash.  All other systems reviewed and negative  General: Affect appropriate Healthy:  appears stated age 59: normal Neck supple with no adenopathy JVP normal no bruits no thyromegaly Lungs ILD crackles bilateral  no wheezing and good diaphragmatic motion Heart:  S1/S2 mild AS  murmur, no rub, gallop or click PMI normal Abdomen: benighn, BS positve, no tenderness, no AAA no bruit.  No HSM or HJR Distal pulses intact with no  bruits No edema Neuro non-focal Skin warm and dry No muscular weakness   Current Outpatient Prescriptions  Medication Sig Dispense Refill  . amLODipine (NORVASC) 10 MG tablet TAKE 1 TABLET BY MOUTH DAILY 90 tablet 3  . aspirin EC 81 MG tablet Take 81 mg by mouth at bedtime.    [name] azaTHIOprine (IMURAN) 50 MG tablet Take 100 mg by mouth 2 (two) times daily.     . carvedilol (COREG) 12.5 MG tablet TAKE 1 TABLET BY MOUTH TWICE A DAY WITH MEALS 60 tablet 9  . clopidogrel (PLAVIX) 75 MG tablet TAKE 1 TABLET BY MOUTH DAILY 30 tablet 6  . desipramine (NOPRAMIN) 10 MG tablet Take 1 tablet at bedtime for two weeks, then increase to 2 tablets. 60 tablet 5  . DHA-Vitamin C-Lutein (EYE HEALTH FORMULA PO) Take 1 tablet by mouth daily.     . DULoxetine (CYMBALTA) 30 MG capsule Take 30 mg by mouth daily.    . furosemide (LASIX) 40 MG tablet TAKE ONE OR TWO TABLETS BY MOUTH EVERY DAY. 60 tablet 11  . gabapentin (NEURONTIN) 300 MG capsule TAKE 3 CAPSULES BY MOUTH EVERY MORNING AND TAKE 4-5 CAPSULES BY MOUTHEVERY NIGHT AT BEDTIME 240 capsule 2  . isosorbide mononitrate (IMDUR) 30 MG 24 hr tablet TAKE 1 TABLET BY MOUTH DAILY 30 tablet 9  . losartan (COZAAR) 100 MG tablet TAKE 1 TABLET BY MOUTH DAILY 90 tablet 3  . metFORMIN (GLUCOPHAGE) 1000 MG tablet Take 1 tablet (1,000 mg total) by mouth  2 (two) times daily with a meal. 60 tablet 11  . Multiple Vitamin (MULTIVITAMIN WITH MINERALS) TABS tablet Take 1 tablet by mouth daily.    . nitroGLYCERIN (NITROSTAT) 0.4 MG SL tablet Place 1 tablet (0.4 mg total) under the tongue every 5 (five) minutes as needed for chest pain. Call 911 if not resolved after 3 doses 25 tablet 1  . omeprazole (PRILOSEC) 40 MG capsule Take 40 mg by mouth daily.    . pravastatin (PRAVACHOL) 40 MG tablet TAKE 1 TABLET BY MOUTH DAILY 90 tablet 3   No current facility-administered medications for this visit.    Allergies  Doxazosin mesylate and Methocarbamol  Electrocardiogram:  01/22/14  SR rate 3 old IMI with T wave inversions  12/30/14  SR rate 67  Old IMI    Assessment and Plan [id number] 2001.  DES to native RCA 2013  LHC (01/14/14): Severe  3V CAD, patent LIMA to LAD, patent SVG to OM2 but mod-sev stent thrombosis  of prox SVG, new occluded SVG to Diag, new occluded SVG to RCA. S/p DES to SVG to OM2  Continue medical Rx Stop plavix Interesting increasing his Imuran for arthritis has helped his chest pain more than anything  DM: Discussed low carb diet.  Target hemoglobin A1c is 6.5 or less.  Continue current medications. HTN: Well controlled.  Continue current medications and low sodium Dash type diet.   Chol:  Lab Results  Component Value Date   LDLCALC 98 03/17/2015   Dyspnea: More related to ILD than heart abnormal basilar lung exam f/u pulmonary  AS:  Mild by previous echo f/u echo in 6 months   [name]

## 2015-07-01 ENCOUNTER — Ambulatory Visit (INDEPENDENT_AMBULATORY_CARE_PROVIDER_SITE_OTHER): Payer: Medicare Other | Admitting: Cardiovascular Disease

## 2015-07-01 ENCOUNTER — Other Ambulatory Visit: Payer: Self-pay | Admitting: Cardiovascular Disease

## 2015-07-01 ENCOUNTER — Encounter: Payer: Self-pay | Admitting: Cardiovascular Disease

## 2015-07-01 VITALS — BP 132/64 | HR 77 | Ht 73.0 in | Wt 221.4 lb

## 2015-07-01 DIAGNOSIS — I2583 Coronary atherosclerosis due to lipid rich plaque: Secondary | ICD-10-CM

## 2015-07-01 DIAGNOSIS — I350 Nonrheumatic aortic (valve) stenosis: Secondary | ICD-10-CM

## 2015-07-01 DIAGNOSIS — I5032 Chronic diastolic (congestive) heart failure: Secondary | ICD-10-CM | POA: Diagnosis not present

## 2015-07-01 DIAGNOSIS — I2510 Atherosclerotic heart disease of native coronary artery without angina pectoris: Secondary | ICD-10-CM

## 2015-07-01 MED ORDER — NITROGLYCERIN 0.4 MG SL SUBL: 0.4000 mg | SUBLINGUAL_TABLET | SUBLINGUAL | Status: DC | PRN

## 2015-07-01 NOTE — Patient Instructions (Signed)
Medication Instructions:  Your physician recommends that you continue on your current medications as directed. Please refer to the Current Medication list given to you today.  Labwork: NONE  Testing/Procedures: Your physician has requested that you have an echocardiogramin 6 months. Echocardiography is a painless test that uses sound waves to create images of your heart. It provides your doctor with information about the size and shape of your heart and how well your heart's chambers and valves are working. This procedure takes approximately one hour. There are no restrictions for this procedure.  Follow-Up: Your physician wants you to follow-up in: 6 months with Dr. Nishan. You will receive a reminder letter in the mail two months in advance. If you don't receive a letter, please call our office to schedule the follow-up appointment.   If you need a refill on your cardiac medications before your next appointment, please call your pharmacy.    

## 2015-07-07 ENCOUNTER — Encounter: Payer: Self-pay | Admitting: Internal Medicine

## 2015-07-07 ENCOUNTER — Ambulatory Visit (INDEPENDENT_AMBULATORY_CARE_PROVIDER_SITE_OTHER): Payer: Medicare Other | Admitting: Internal Medicine

## 2015-07-07 VITALS — BP 142/70 | HR 71 | Ht 73.0 in | Wt 221.2 lb

## 2015-07-07 DIAGNOSIS — J849 Interstitial pulmonary disease, unspecified: Secondary | ICD-10-CM | POA: Diagnosis not present

## 2015-07-07 DIAGNOSIS — R0600 Dyspnea, unspecified: Secondary | ICD-10-CM | POA: Diagnosis not present

## 2015-07-07 NOTE — Addendum Note (Signed)
Addended by: Collier Salina on: 07/07/2015 10:03 AM   Modules accepted: Orders

## 2015-07-07 NOTE — Patient Instructions (Addendum)
ICD-9-CM ICD-10-CM   1. ILD (interstitial lung disease) ([place]) 515 J84.9   2. Dyspnea 786.09 R06.00     Unclear why you're symptoms are worse  Plan - Do high resolution CT chest -Do full pulmonary function test  -These will be follow-up from June 2015 - Do overnight oxygen test  Follow-up  -Return to see my nurse practitioner or myself within the next few to several weeks after completion of the above

## 2015-07-07 NOTE — Progress Notes (Signed)
Subjective:     Patient ID: [name], male   DOB: 1931-07-11, 82 y.o.   MRN: [id number]  HPI   OV 01/07/2015  Chief Complaint  Patient presents with  . Follow-up    Pt states his breathing is unchanged since last OV. Pt c/o prod cough with green mucus. Pt denies CP/tightness.     Follow-up multifactorial dyspnea. Cardiopulmonary stress test showed submaximal exertion. He also has rheumatoid arthritis with ILD. Last visit I referred him to [facility] for his dyspnea. They returned back saying that possibly treating his ILD with increased Imuran would be the way to improve his dyspnea. Therefore after talking to his local rheumatologist Dr. Keturah Barre I increased Imuran. Further titration of iommuran will be done by Dr. Keturah Barre. He says with this and his wife who is here with him for the first time also test the same that the chest tightness and the pain that he was having with exertion has improved. Therefore retrospect this chest pain was due to rheumatoid arthritis. However dyspnea and fatigue are unchanged. He is reluctantly willing to undergo pulmonary rehabilitation. There are no other new issues   OV 07/07/2015  Chief Complaint  Patient presents with  . Follow-up    Pt states his breathing has worsened in the last month. Pt c/o increase in SOB, chest tightness with activity, and wheezing.     Follow-up multifactorial dyspnea in the setting of rheumatoid arthritis interstitial lung disease and coronary artery disease status post intervention associated with mild aortic stenosis.  Last visit was October 2016. At that time his Imuran was increased by [facility] for his rheumatoid arthritis interstitial lung disease and this dyspnea or exertional chest tightness and improved. However now at routine follow-up he tells [place] that in the last 1 month dyspnea is worse. Doing even minimal exertion brings on dyspnea and it is associated with central chest pain. He did see cardiology  07/01/2015. I personally visualized the note. The feeling by cardiology is that dyspnea worsening is related to interstitial lung disease. Review of the chart shows last pulmonary function tests and CT scan of the chest is in June 2015. He has undergone pulmonary rehabilitation.    has a past medical history of Hyperlipidemia; Hypertension; GERD (gastroesophageal reflux disease); Overweight (BMI 25.0-29.9); Interstitial lung disease ([place]); Diverticulosis; Esophageal stricture; CAD (coronary artery disease); History of PFTs; Colon polyps; Chronic kidney disease, stage III (moderate); Chronic diastolic CHF (congestive heart failure) ([place]); Heart murmur; Chronic lower back pain; Dyspnea (2009 since July -Sept); Hiatal hernia; Type II diabetes mellitus ([place]); Arthritis; RA (rheumatoid arthritis) ([place]); Seropositive rheumatoid arthritis ([place]); and Enlarged prostate.   reports that he quit smoking about 52 years ago. His smoking use included Cigarettes. He has a 20 pack-year smoking history. He has never used smokeless tobacco.  Past Surgical History  Procedure Laterality Date  . Total knee arthroplasty Right 03/2010    [name]  . Knee arthroscopy Right 2008  . Cataract extraction w/ intraocular lens  implant, bilateral Bilateral   . Coronary artery bypass graft  11/1999    CABG X5  . Coronary stent placement  02/2012    1 stent and balloon  . Shoulder arthroscopy with open rotator cuff repair and distal clavicle acrominectomy Left 02/27/2013    Procedure: LEFT SHOULDER ARTHROSCOPY WITH MINI OPEN ROTATOR CUFF REPAIR AND SUBACROMIAL DECOMPRESSION AND DISTAL CLAVICLE RESECTION;  Surgeon: [name], MD;  Location: [place];  Service: Orthopedics;  Laterality: Left;  .  Trigger finger release Left 02/27/2013    Procedure: RELEASE TRIGGER FINGER/A-1 PULLEY;  Surgeon: [name], MD;  Location: [place];  Service: Orthopedics;  Laterality: Left;  . Cholecystectomy open  11/2003    [name]  . Joint  replacement    . Esophagogastroduodenoscopy (egd) with esophageal dilation  2010  . Cardiac catheterization  08/2004    CP- no MI, Cath- small vessell disease   . Cardiac catheterization  12/31/2011    80% distal LM, 100% native LAD, LCx and RCA, 30% prox SVG-OM, SVG-D1 normal, 99% distal, 80% ostial SVG-RCA distal to graft, LIMA-LAD normal; LVEF mildly decreased with posterior basal AK   . Coronary angioplasty with stent placement  01/03/2012    Successful DES to SVG-RCA and cutting balloon angioplasty ostial  PDA   . Coronary angioplasty with stent placement  01/14/2014    "1"  . Cardiac catheterization  2009    with patent grafts/notes 12/31/2011  . Left and right heart catheterization with coronary angiogram  12/31/2011    Procedure: LEFT AND RIGHT HEART CATHETERIZATION WITH CORONARY ANGIOGRAM;  Surgeon: Burnell Blanks, MD;  Location: [facility] CATH LAB;  Service: Cardiovascular;;  . Percutaneous coronary stent intervention (pci-s)  12/31/2011    Procedure: PERCUTANEOUS CORONARY STENT INTERVENTION (PCI-S);  Surgeon: Burnell Blanks, MD;  Location: [facility] CATH LAB;  Service: Cardiovascular;;  . Percutaneous coronary stent intervention (pci-s) N/A 01/03/2012    Procedure: PERCUTANEOUS CORONARY STENT INTERVENTION (PCI-S);  Surgeon: Peter M Martinique, MD;  Location: [facility] CATH LAB;  Service: Cardiovascular;  Laterality: N/A;  . Percutaneous coronary intervention-balloon only  01/03/2012    Procedure: PERCUTANEOUS CORONARY INTERVENTION-BALLOON ONLY;  Surgeon: Peter M Martinique, MD;  Location: [facility] CATH LAB;  Service: Cardiovascular;;  . Left and right heart catheterization with coronary angiogram N/A 01/14/2014    Procedure: LEFT AND RIGHT HEART CATHETERIZATION WITH CORONARY ANGIOGRAM;  Surgeon: Peter M Martinique, MD;  Location: [facility] CATH LAB;  Service: Cardiovascular;  Laterality: N/A;  . Hand surgery      Allergies  Allergen Reactions  . Doxazosin Mesylate Other (See Comments)    dizziness  .  Methocarbamol Rash    Immunization History  Administered Date(s) Administered  . H1N1 03/27/2008  . Influenza Split 12/29/2010, 01/18/2012  . Influenza Whole 02/03/2007, 01/09/2008, 01/01/2009, 12/31/2009  . Influenza,inj,Quad PF,36+ Mos 12/13/2012, 01/15/2014, 01/15/2015  . Pneumococcal Conjugate-13 09/10/2013  . Pneumococcal Polysaccharide-23 03/05/1997, 08/17/2011  . Td 03/05/1997, 08/17/2011    Family History  Problem Relation Age of Onset  . COPD Mother   . Heart disease Father   . Diabetes Brother   . Colon cancer Brother 25  . Alcohol abuse Sister   . Heart attack Father   . Stroke Sister      Current outpatient prescriptions:  .  amLODipine (NORVASC) 10 MG tablet, TAKE 1 TABLET BY MOUTH DAILY, Disp: 90 tablet, Rfl: 3 .  aspirin EC 81 MG tablet, Take 81 mg by mouth at bedtime., Disp: , Rfl:  .  azaTHIOprine (IMURAN) 50 MG tablet, Take 100 mg by mouth 2 (two) times daily. , Disp: , Rfl:  .  carvedilol (COREG) 12.5 MG tablet, TAKE 1 TABLET BY MOUTH TWICE A DAY WITH MEALS, Disp: 60 tablet, Rfl: 9 .  clopidogrel (PLAVIX) 75 MG tablet, TAKE 1 TABLET BY MOUTH DAILY, Disp: 30 tablet, Rfl: 6 .  desipramine (NOPRAMIN) 10 MG tablet, Take 1 tablet at bedtime for two weeks, then increase to 2 tablets., Disp: 60 tablet, Rfl: 5 .  DHA-Vitamin C-Lutein (EYE HEALTH FORMULA PO), Take 1 tablet by mouth daily. , Disp: , Rfl:  .  furosemide (LASIX) 40 MG tablet, TAKE ONE OR TWO TABLETS BY MOUTH EVERY DAY., Disp: 60 tablet, Rfl: 11 .  gabapentin (NEURONTIN) 300 MG capsule, TAKE 3 CAPSULES BY MOUTH EVERY MORNING AND TAKE 4-5 CAPSULES BY MOUTHEVERY NIGHT AT BEDTIME, Disp: 240 capsule, Rfl: 2 .  isosorbide mononitrate (IMDUR) 30 MG 24 hr tablet, TAKE 1 TABLET BY MOUTH DAILY, Disp: 30 tablet, Rfl: 9 .  losartan (COZAAR) 100 MG tablet, TAKE 1 TABLET BY MOUTH DAILY, Disp: 90 tablet, Rfl: 3 .  metFORMIN (GLUCOPHAGE) 1000 MG tablet, Take 1 tablet (1,000 mg total) by mouth 2 (two) times daily with a  meal., Disp: 60 tablet, Rfl: 11 .  Multiple Vitamin (MULTIVITAMIN WITH MINERALS) TABS tablet, Take 1 tablet by mouth daily., Disp: , Rfl:  .  nitroGLYCERIN (NITROSTAT) 0.4 MG SL tablet, Place 1 tablet (0.4 mg total) under the tongue every 5 (five) minutes as needed for chest pain. Call 911 if not resolved after 3 doses, Disp: 25 tablet, Rfl: 1 .  pravastatin (PRAVACHOL) 40 MG tablet, TAKE 1 TABLET BY MOUTH DAILY, Disp: 90 tablet, Rfl: 3     Review of Systems     Objective:   Physical Exam  Constitutional: He is oriented to person, place, and time. He appears well-developed and well-nourished. No distress.  HENT:  Head: Normocephalic and atraumatic.  Right Ear: External ear normal.  Left Ear: External ear normal.  Mouth/Throat: Oropharynx is clear and moist. No oropharyngeal exudate.  Eyes: Conjunctivae and EOM are normal. Pupils are equal, round, and reactive to light. Right eye exhibits no discharge. Left eye exhibits no discharge. No scleral icterus.  Neck: Normal range of motion. Neck supple. No JVD present. No tracheal deviation present. No thyromegaly present.  Cardiovascular: Normal rate, regular rhythm and intact distal pulses.  Exam reveals no gallop and no friction rub.   No murmur heard. Pulmonary/Chest: Effort normal. No respiratory distress. He has no wheezes. He has rales. He exhibits no tenderness.  Crackles halfway up  Abdominal: Soft. Bowel sounds are normal. He exhibits no distension and no mass. There is no tenderness. There is no rebound and no guarding.  Musculoskeletal: Normal range of motion. He exhibits no edema or tenderness.  Lymphadenopathy:    He has no cervical adenopathy.  Neurological: He is alert and oriented to person, place, and time. He has normal reflexes. No cranial nerve deficit. Coordination normal.  Skin: Skin is warm and dry. No rash noted. He is not diaphoretic. No erythema. No pallor.  Psychiatric: He has a normal mood and affect. His behavior  is normal. Judgment and thought content normal.  Nursing note and vitals reviewed.   Filed Vitals:   07/07/15 0931  BP: 142/70  Pulse: 71  Height: 6\' 1"  (1.854 m)  Weight: 221 lb 3.2 oz (100.336 kg)  SpO2: 96%        Assessment:       ICD-9-CM ICD-10-CM   1. ILD (interstitial lung disease) ([place]) 515 J84.9   2. Dyspnea 786.09 R06.00        Plan:      Unclear why you're symptoms are worse Despite pulmonary rehabilitation.[name] He clearly has multifactorial dyspnea. All I can do at this stage is to make sure interstitial lung disease is not worse.  Plan - Do high resolution CT chest -Do full pulmonary function test  -These will be follow-up from  June 2015 - Do overnight oxygen test  Follow-up  -Return to see my nurse practitioner or myself within the next few to several weeks after completion of the above    Dr. Brand Males, M.D., [facility].C.P Pulmonary and Critical Care Medicine Staff Physician [place] [facility] Pager: [phone], If no answer or between  15:00h - 7:00h: call [phone]  07/07/2015 9:55 AM

## 2015-07-08 ENCOUNTER — Ambulatory Visit (INDEPENDENT_AMBULATORY_CARE_PROVIDER_SITE_OTHER)
Admission: RE | Admit: 2015-07-08 | Discharge: 2015-07-08 | Disposition: A | Discharge status: Home / Self Care | Payer: Medicare Other | Source: Ambulatory Visit | Attending: Internal Medicine | Admitting: Internal Medicine

## 2015-07-08 DIAGNOSIS — J849 Interstitial pulmonary disease, unspecified: Secondary | ICD-10-CM | POA: Diagnosis not present

## 2015-07-08 DIAGNOSIS — R918 Other nonspecific abnormal finding of lung field: Secondary | ICD-10-CM | POA: Diagnosis not present

## 2015-07-09 DIAGNOSIS — J849 Interstitial pulmonary disease, unspecified: Secondary | ICD-10-CM | POA: Diagnosis not present

## 2015-07-10 ENCOUNTER — Telehealth: Payer: Self-pay | Admitting: Internal Medicine

## 2015-07-10 NOTE — Telephone Encounter (Signed)
LMTCB for pt.    Notes Recorded by Brand Males, MD on 07/09/2015 at 6:18 PM Let him [name] know that CT is now any worse over time. I am simply stumped by his shortness of breath

## 2015-07-10 NOTE — Progress Notes (Signed)
Quick Note:  Please see phone note from 4.6.17. Will sign off. ______

## 2015-07-10 NOTE — Progress Notes (Signed)
Quick Note:  lmtcb for pt. ______ 

## 2015-07-11 NOTE — Telephone Encounter (Signed)
Spoke with pt, aware of results/recs.  Nothing further needed.  

## 2015-07-17 DIAGNOSIS — J8410 Pulmonary fibrosis, unspecified: Secondary | ICD-10-CM | POA: Diagnosis not present

## 2015-07-17 DIAGNOSIS — R0689 Other abnormalities of breathing: Secondary | ICD-10-CM | POA: Diagnosis not present

## 2015-07-21 DIAGNOSIS — H353123 Nonexudative age-related macular degeneration, left eye, advanced atrophic without subfoveal involvement: Secondary | ICD-10-CM | POA: Diagnosis not present

## 2015-07-21 DIAGNOSIS — D3131 Benign neoplasm of right choroid: Secondary | ICD-10-CM | POA: Diagnosis not present

## 2015-07-21 DIAGNOSIS — H353213 Exudative age-related macular degeneration, right eye, with inactive scar: Secondary | ICD-10-CM | POA: Diagnosis not present

## 2015-07-21 DIAGNOSIS — H35372 Puckering of macula, left eye: Secondary | ICD-10-CM | POA: Diagnosis not present

## 2015-07-23 ENCOUNTER — Telehealth: Payer: Self-pay | Admitting: Internal Medicine

## 2015-07-23 NOTE — Telephone Encounter (Signed)
Per MR: ONO on RA from 4.5.17 showed desat during the night, pt will need to be started on 2lpm O2 at night.

## 2015-07-24 NOTE — Telephone Encounter (Signed)
lmtcb for pt.  

## 2015-07-26 ENCOUNTER — Other Ambulatory Visit: Payer: Self-pay | Admitting: Cardiovascular Disease

## 2015-07-28 NOTE — Telephone Encounter (Signed)
Spoke with pt and he gave test results. Pt states that he is already on O2/2L QHS.

## 2015-07-28 NOTE — Telephone Encounter (Signed)
Pt aware to continue the 2lpm at night. Nothing further needed at this time.

## 2015-07-28 NOTE — Telephone Encounter (Signed)
Pt returning call for results and can be reached @ [phone].[name]

## 2015-07-29 ENCOUNTER — Ambulatory Visit (HOSPITAL_COMMUNITY)
Admission: RE | Admit: 2015-07-29 | Discharge: 2015-07-29 | Disposition: A | Discharge status: Home / Self Care | Payer: Medicare Other | Source: Ambulatory Visit | Attending: Internal Medicine | Admitting: Internal Medicine

## 2015-07-29 ENCOUNTER — Ambulatory Visit: Payer: Medicare Other | Admitting: Adult Health

## 2015-07-29 DIAGNOSIS — J849 Interstitial pulmonary disease, unspecified: Secondary | ICD-10-CM | POA: Diagnosis not present

## 2015-07-29 LAB — PULMONARY FUNCTION TEST
DL/VA % PRED: 77 %
DL/VA: 3.69 ml/min/mmHg/L
DLCO UNC % PRED: 40 %
DLCO unc: 14.87 ml/min/mmHg
FEF 25-75 PRE: 0.77 L/s
FEF 25-75 Post: 1.29 L/sec
FEF2575-%CHANGE-POST: 66 %
FEF2575-%PRED-POST: 59 %
FEF2575-%Pred-Pre: 35 %
FEV1-%CHANGE-POST: 9 %
FEV1-%PRED-POST: 57 %
FEV1-%Pred-Pre: 52 %
FEV1-Post: 1.84 L
FEV1-Pre: 1.67 L
FEV1FVC-%CHANGE-POST: 6 %
FEV1FVC-%Pred-Pre: 93 %
FEV6-%CHANGE-POST: 6 %
FEV6-%PRED-POST: 61 %
FEV6-%PRED-PRE: 57 %
FEV6-PRE: 2.41 L
FEV6-Post: 2.56 L
FEV6FVC-%Change-Post: 3 %
FEV6FVC-%PRED-PRE: 101 %
FEV6FVC-%Pred-Post: 105 %
FVC-%CHANGE-POST: 3 %
FVC-%PRED-POST: 58 %
FVC-%Pred-Pre: 56 %
FVC-PRE: 2.52 L
FVC-Post: 2.6 L
POST FEV6/FVC RATIO: 99 %
PRE FEV6/FVC RATIO: 96 %
Post FEV1/FVC ratio: 71 %
Pre FEV1/FVC ratio: 66 %
RV % PRED: 77 %
RV: 2.22 L
TLC % pred: 62 %
TLC: 4.81 L

## 2015-07-29 MED ORDER — ALBUTEROL SULFATE (2.5 MG/3ML) 0.083% IN NEBU
2.5000 mg | INHALATION_SOLUTION | Freq: Once | RESPIRATORY_TRACT | Status: AC
  Administered 2015-07-29: 2.5 mg via RESPIRATORY_TRACT

## 2015-07-30 DIAGNOSIS — Z79899 Other long term (current) drug therapy: Secondary | ICD-10-CM | POA: Diagnosis not present

## 2015-07-31 ENCOUNTER — Encounter: Payer: Self-pay | Admitting: Adult Health

## 2015-07-31 ENCOUNTER — Ambulatory Visit (INDEPENDENT_AMBULATORY_CARE_PROVIDER_SITE_OTHER): Payer: Medicare Other | Admitting: Adult Health

## 2015-07-31 VITALS — BP 114/66 | HR 72 | Temp 97.6°F | Ht 73.0 in | Wt 221.0 lb

## 2015-07-31 DIAGNOSIS — J9611 Chronic respiratory failure with hypoxia: Secondary | ICD-10-CM | POA: Diagnosis not present

## 2015-07-31 DIAGNOSIS — J849 Interstitial pulmonary disease, unspecified: Secondary | ICD-10-CM

## 2015-07-31 NOTE — Patient Instructions (Signed)
Continue on current regimen .  Follow up with Dr. Chase Caller in 4 months and As needed

## 2015-07-31 NOTE — Assessment & Plan Note (Signed)
RA r/t ILD  No sign change on HRCT chest  PFT does show decline in DLCO  Would cont w/ current regimen  Vaccine utd.  follow up Dr. Chase Caller in 4 months .

## 2015-07-31 NOTE — Assessment & Plan Note (Signed)
Cont on O2 At bedtime   

## 2015-07-31 NOTE — Progress Notes (Signed)
Subjective:    Patient ID: [name], male    DOB: 08-27-1931, 82 y.o.   MRN: [id number]  HPI 82 yo male former smoker with RA-ILD   TEST     - Coronary artery disease. s/p CABG x 5 in 2002.  Dyspnea started following lopressor Jan-July 2009 and lisinopril increae July 2009. No relief despite stopping agents July 2010  -   Myoview on April 17, 2007 was  nonischemic with an EF of 51% and inferobasal wall scar.   - CAth Oct 2009:    - . Severe native three-vessel coronary artery disease. 2. Status post multivessel coronary bypass surgery with all grafts  patent. Normal LVEF and LVEDP - PFT - Mixed pattern on spiro. Mild restn on lung volumeswith near normal DLCO.   - Fev1 2.2L/73%, FVC 3.21L/70%, Ratio 68 (67),  TLC 4.7L/68%, RV 1.5L/55%, DLCO 79%.  -CPST 05/06/2008: Normal effot.    - Reduced VO2 max 20.5/65%, REduced AT 8.2/40%, Normal breathing reservce of 55%, submaximal heart rate response 112/77%,  flattened O2 pulse response at peak exercise - 26ml/beat at 85%. No VQ mismatch abnormalities. - ECHO 09/04/2008: LVH, ef 60%, mild AS and unchanged from prior   - CT chest   - Non specific Interstitial Lung disease NOS. Stable pulm infilratates RLL ggo > LLL ggo. 03/2008 -> 10/2008 -> June 2011: stable - Rehab: never attended 2009-2013 due to cost co.  2013 Walking desat test : resting 98/94% -> 3 laps x 185 feet; HR 113/pulse ox 96%    PFTs July 2013 show worsening restriction since 2010 along with reduced diffusion. The RLL baseline GGO might be worse in July 2013 compared to 2011. In addition, autommune profile shows VERY HIGH TITERS of CCP antibody > 300 which is pathognomonic of rheumtoid arthritis lung involvement. RF is only borderline elevated a 23  PFT 10/12/11>>FEV1 2.02 (70%), Fvc 2.8/72%, TLC 4.07 (59%), DLCO 62%, no BD: RESTRICTION - WORSE since 2010 -(2010:  Fev1 2.2L/73%, FVC 3.21L/70%, Ratio 68 (67),  TLC 4.7L/68%, RV 1.5L/55%, DLCO 79%)   Echo 05/04/11>>mild LVH, EF  55 to [id number], grade 1 diastolic dysfx, mild AS, mild MR, PAS 34 mmHg  CT 11/02/11 - calcification suspicious for aortic valve and some GGO esp RLL > LLL (? RLL worse compared to 2011)   [place] 12/31/11: dLM 80%, LAD 100%, circumflex 100%, RCA 100%, proximal SVG-OM 30%, SVG-D1 ok, SVG-RCA 99% distal and 80% ostial PDA distal to graft, LIMA-LAD ok with some left to right collaterals.  PCI 01/03/12: Promus DES to the SVG-RCA and Cutting Balloon angioplasty to the ostial PDA.  Echocardiogram 12/31/11: EF 99991111, grade 1 diastolic dysfunction, mild aortic stenosis, mean gradient 9, mild LAE.   Walking desaturation test on 05/05/2012 185 feet x 3 laps:  did NOT desaturate. Rest pulse ox was 98%, final pulse ox was 96%. HR response 69/min at rest to 82/min at peak exertion.   PFT FVC fev1 ratio BD fev1 TLC DLCO comment intervention  2010 3.2L/70% 2.2L/73%   4.7L/68% 79%    10/12/11 2.8L/72% 2.02L/70% 72  4.07L/59% 62% Worsening restriction since 2010 Rheum will start immuran/pred. Also had PCI in nov 2013  04/28/12 2.6L/59% 1.77L/62% 67  4.0L/59% 15.7/71%     '2015 PFT showed no significant change with FEV1 56% , ratio 66,  FVC 61%.   07/31/2015 Follow up : ILD ? RA related  Pt returns for follow up . Has been have more DOE.  Was set up for a  CT chest  PFT on 4/25 showed FEV1 57%, ratio 71, FVC 58%, no sign BD response , + BD in midflows.  DLCO 40%. (this is decreased from 2015- was 59%.  TLC is similar to 2015 . Has been started on Imuran for RA ILD .  CT chest did not show any sgin change , cont Bilateral LL GGO and bronchiectasis .  Remains on O2 2l/m  At bedtime  . ONO showed desats on RA , O2 was continued.  Has been seen by [facility] last year. Now on Imuran, feels Arthritis pain is better.  Says he is feels he is doing okay, gets winded with walking long distances.  Can walk 1/2 block before stopping.  Has dry cough some days.  PVX and Prevnar 13 are utd.   Followed by Dr. Estil Daft in [facility]. Remains on  Imuran. Most pain is in his hands.   Denies chest pain, orthopnea , edema or fever.     Review of Systems Constitutional:   No  weight loss, night sweats,  Fevers, chills, + fatigue, or  lassitude.  HEENT:   No headaches,  Difficulty swallowing,  Tooth/dental problems, or  Sore throat,                No sneezing, itching, ear ache, nasal congestion, post nasal drip,   CV:  No chest pain,  Orthopnea, PND, swelling in lower extremities, anasarca, dizziness, palpitations, syncope.   GI  No heartburn, indigestion, abdominal pain, nausea, vomiting, diarrhea, change in bowel habits, loss of appetite, bloody stools.   Resp: .  No chest wall deformity  Skin: no rash or lesions.  GU: no dysuria, change in color of urine, no urgency or frequency.  No flank pain, no hematuria   MS:  No joint pain or swelling.  No decreased range of motion.  No back pain.  Psych:  No change in mood or affect. No depression or anxiety.  No memory loss.         Objective:   Physical Exam  Filed Vitals:   07/31/15 1421  BP: 114/66  Pulse: 72  Temp: 97.6 F (36.4 C)  TempSrc: Oral  Height: 6\' 1"  (1.854 m)  Weight: 221 lb (100.245 kg)  SpO2: 93%    GEN: A/Ox3; pleasant , NAD, elderly obese   HEENT:  [place]/AT,  EACs-clear, TMs-wnl, NOSE-clear, THROAT-clear, no lesions, no postnasal drip or exudate noted.   NECK:  Supple w/ fair ROM; no JVD; normal carotid impulses w/o bruits; no thyromegaly or nodules palpated; no lymphadenopathy.  RESP  Trace bibasilar crackles no accessory muscle use, no dullness to percussion  CARD:  RRR, no m/r/g  , tr  peripheral edema, pulses intact, no cyanosis or clubbing.  GI:   Soft & nt; nml bowel sounds; no organomegaly or masses detected.  Musco: Warm bil, no deformities or joint swelling noted.   Neuro: alert, no focal deficits noted.    Skin: Warm, no lesions or rashes  CT chest 07/08/15  Bilateral lower lobe platelike ground-glass and bronchiectasis  are indicative of scarring. No definitive evidence of interstitial lung Disease.   Shamya Macfadden NP-C  [facility]  07/31/2015     Assessment & Plan:

## 2015-08-06 ENCOUNTER — Other Ambulatory Visit: Payer: Self-pay | Admitting: Internal Medicine

## 2015-08-07 ENCOUNTER — Telehealth: Payer: Self-pay | Admitting: Internal Medicine

## 2015-08-07 NOTE — Telephone Encounter (Signed)
Pt states he has been off of blood thinners for over a month now. Pt wants to know if he can be scheduled for an EGD now. Please advise.

## 2015-08-08 NOTE — Telephone Encounter (Signed)
Ok to set up for EGD w/ dilation if there are no other issues

## 2015-08-11 NOTE — Telephone Encounter (Signed)
Pt scheduled for previsit 08/18/15@2 :30pm, EGD with dil scheduled in the [place] 08/20/15@9am . Left message for pt to call back regarding appts.

## 2015-08-12 ENCOUNTER — Other Ambulatory Visit: Payer: Self-pay

## 2015-08-12 NOTE — Telephone Encounter (Signed)
Spoke with pt and he is aware of appts. 

## 2015-08-13 ENCOUNTER — Telehealth: Payer: Self-pay

## 2015-08-13 ENCOUNTER — Emergency Department (HOSPITAL_COMMUNITY): Payer: Medicare Other

## 2015-08-13 ENCOUNTER — Telehealth: Payer: Self-pay | Admitting: Internal Medicine

## 2015-08-13 ENCOUNTER — Encounter (HOSPITAL_COMMUNITY)
Admission: EM | Disposition: A | Discharge status: Home / Self Care | Payer: Self-pay | Source: Home / Self Care | Attending: Cardiovascular Disease

## 2015-08-13 ENCOUNTER — Encounter (HOSPITAL_COMMUNITY): Payer: Self-pay | Admitting: Emergency Medicine

## 2015-08-13 ENCOUNTER — Inpatient Hospital Stay (HOSPITAL_COMMUNITY)
Admission: EM | Admit: 2015-08-13 | Discharge: 2015-08-14 | DRG: 247 | Disposition: A | Discharge status: Home / Self Care | Payer: Medicare Other | Attending: Cardiovascular Disease | Admitting: Cardiovascular Disease

## 2015-08-13 DIAGNOSIS — Z7982 Long term (current) use of aspirin: Secondary | ICD-10-CM

## 2015-08-13 DIAGNOSIS — I350 Nonrheumatic aortic (valve) stenosis: Secondary | ICD-10-CM | POA: Diagnosis present

## 2015-08-13 DIAGNOSIS — E1149 Type 2 diabetes mellitus with other diabetic neurological complication: Secondary | ICD-10-CM | POA: Diagnosis present

## 2015-08-13 DIAGNOSIS — Z961 Presence of intraocular lens: Secondary | ICD-10-CM | POA: Diagnosis not present

## 2015-08-13 DIAGNOSIS — I130 Hypertensive heart and chronic kidney disease with heart failure and stage 1 through stage 4 chronic kidney disease, or unspecified chronic kidney disease: Secondary | ICD-10-CM | POA: Diagnosis present

## 2015-08-13 DIAGNOSIS — E785 Hyperlipidemia, unspecified: Secondary | ICD-10-CM | POA: Diagnosis not present

## 2015-08-13 DIAGNOSIS — Z9842 Cataract extraction status, left eye: Secondary | ICD-10-CM

## 2015-08-13 DIAGNOSIS — I2510 Atherosclerotic heart disease of native coronary artery without angina pectoris: Secondary | ICD-10-CM | POA: Diagnosis present

## 2015-08-13 DIAGNOSIS — I214 Non-ST elevation (NSTEMI) myocardial infarction: Principal | ICD-10-CM

## 2015-08-13 DIAGNOSIS — I5032 Chronic diastolic (congestive) heart failure: Secondary | ICD-10-CM | POA: Diagnosis present

## 2015-08-13 DIAGNOSIS — Z833 Family history of diabetes mellitus: Secondary | ICD-10-CM

## 2015-08-13 DIAGNOSIS — Z888 Allergy status to other drugs, medicaments and biological substances status: Secondary | ICD-10-CM | POA: Diagnosis not present

## 2015-08-13 DIAGNOSIS — Z96651 Presence of right artificial knee joint: Secondary | ICD-10-CM | POA: Diagnosis not present

## 2015-08-13 DIAGNOSIS — Z7984 Long term (current) use of oral hypoglycemic drugs: Secondary | ICD-10-CM | POA: Diagnosis not present

## 2015-08-13 DIAGNOSIS — N183 Chronic kidney disease, stage 3 (moderate): Secondary | ICD-10-CM | POA: Diagnosis not present

## 2015-08-13 DIAGNOSIS — K219 Gastro-esophageal reflux disease without esophagitis: Secondary | ICD-10-CM | POA: Diagnosis present

## 2015-08-13 DIAGNOSIS — I10 Essential (primary) hypertension: Secondary | ICD-10-CM | POA: Diagnosis present

## 2015-08-13 DIAGNOSIS — N184 Chronic kidney disease, stage 4 (severe): Secondary | ICD-10-CM | POA: Diagnosis present

## 2015-08-13 DIAGNOSIS — Z79899 Other long term (current) drug therapy: Secondary | ICD-10-CM | POA: Diagnosis not present

## 2015-08-13 DIAGNOSIS — Z9841 Cataract extraction status, right eye: Secondary | ICD-10-CM

## 2015-08-13 DIAGNOSIS — Z8249 Family history of ischemic heart disease and other diseases of the circulatory system: Secondary | ICD-10-CM | POA: Diagnosis not present

## 2015-08-13 DIAGNOSIS — Z955 Presence of coronary angioplasty implant and graft: Secondary | ICD-10-CM | POA: Diagnosis not present

## 2015-08-13 DIAGNOSIS — I25810 Atherosclerosis of coronary artery bypass graft(s) without angina pectoris: Secondary | ICD-10-CM | POA: Diagnosis not present

## 2015-08-13 DIAGNOSIS — J849 Interstitial pulmonary disease, unspecified: Secondary | ICD-10-CM | POA: Diagnosis not present

## 2015-08-13 DIAGNOSIS — R079 Chest pain, unspecified: Secondary | ICD-10-CM | POA: Diagnosis not present

## 2015-08-13 DIAGNOSIS — E7849 Other hyperlipidemia: Secondary | ICD-10-CM | POA: Diagnosis present

## 2015-08-13 DIAGNOSIS — T82898D Other specified complication of vascular prosthetic devices, implants and grafts, subsequent encounter: Secondary | ICD-10-CM

## 2015-08-13 DIAGNOSIS — E1165 Type 2 diabetes mellitus with hyperglycemia: Secondary | ICD-10-CM | POA: Diagnosis present

## 2015-08-13 DIAGNOSIS — I5022 Chronic systolic (congestive) heart failure: Secondary | ICD-10-CM | POA: Diagnosis present

## 2015-08-13 DIAGNOSIS — I25110 Atherosclerotic heart disease of native coronary artery with unstable angina pectoris: Secondary | ICD-10-CM | POA: Diagnosis not present

## 2015-08-13 DIAGNOSIS — I5030 Unspecified diastolic (congestive) heart failure: Secondary | ICD-10-CM | POA: Diagnosis present

## 2015-08-13 DIAGNOSIS — I25119 Atherosclerotic heart disease of native coronary artery with unspecified angina pectoris: Secondary | ICD-10-CM | POA: Diagnosis present

## 2015-08-13 DIAGNOSIS — E1122 Type 2 diabetes mellitus with diabetic chronic kidney disease: Secondary | ICD-10-CM | POA: Diagnosis present

## 2015-08-13 HISTORY — PX: CARDIAC CATHETERIZATION: SHX172

## 2015-08-13 LAB — CBC
HCT: 37.6 % — ABNORMAL LOW (ref 39.0–52.0)
Hemoglobin: 12.1 g/dL — ABNORMAL LOW (ref 13.0–17.0)
MCH: 27.5 pg (ref 26.0–34.0)
MCHC: 32.2 g/dL (ref 30.0–36.0)
MCV: 85.5 fL (ref 78.0–100.0)
PLATELETS: 200 10*3/uL (ref 150–400)
RBC: 4.4 MIL/uL (ref 4.22–5.81)
RDW: 15.6 % — AB (ref 11.5–15.5)
WBC: 7.6 10*3/uL (ref 4.0–10.5)

## 2015-08-13 LAB — BASIC METABOLIC PANEL
ANION GAP: 13 (ref 5–15)
BUN: 18 mg/dL (ref 6–20)
CALCIUM: 9.7 mg/dL (ref 8.9–10.3)
CO2: 25 mmol/L (ref 22–32)
Chloride: 102 mmol/L (ref 101–111)
Creatinine, Ser: 1.56 mg/dL — ABNORMAL HIGH (ref 0.61–1.24)
GFR calc Af Amer: 46 mL/min — ABNORMAL LOW (ref >60)
GFR, EST NON AFRICAN AMERICAN: 40 mL/min — AB (ref >60)
Glucose, Bld: 170 mg/dL — ABNORMAL HIGH (ref 65–99)
POTASSIUM: 4.1 mmol/L (ref 3.5–5.1)
SODIUM: 140 mmol/L (ref 135–145)

## 2015-08-13 LAB — GLUCOSE, CAPILLARY: Glucose-Capillary: 116 mg/dL — ABNORMAL HIGH (ref 65–99)

## 2015-08-13 LAB — POCT ACTIVATED CLOTTING TIME
ACTIVATED CLOTTING TIME: 224 s
ACTIVATED CLOTTING TIME: 265 s
Activated Clotting Time: 255 seconds

## 2015-08-13 LAB — APTT: aPTT: 29 seconds (ref 24–37)

## 2015-08-13 LAB — TROPONIN I
TROPONIN I: 0.5 ng/mL — AB (ref <0.031)
TROPONIN I: 0.68 ng/mL — AB (ref <0.031)
Troponin I: 0.49 ng/mL — ABNORMAL HIGH (ref <0.031)

## 2015-08-13 LAB — PROTIME-INR
INR: 1.12 (ref 0.00–1.49)
Prothrombin Time: 14.6 seconds (ref 11.6–15.2)

## 2015-08-13 LAB — I-STAT TROPONIN, ED: Troponin i, poc: 0.42 ng/mL (ref 0.00–0.08)

## 2015-08-13 LAB — BRAIN NATRIURETIC PEPTIDE: B NATRIURETIC PEPTIDE 5: 318.4 pg/mL — AB (ref 0.0–100.0)

## 2015-08-13 SURGERY — LEFT HEART CATH AND CORS/GRAFTS ANGIOGRAPHY

## 2015-08-13 MED ORDER — ACETAMINOPHEN 325 MG PO TABS
650.0000 mg | ORAL_TABLET | ORAL | Status: DC | PRN
  Filled 2015-08-13 (×2): qty 2

## 2015-08-13 MED ORDER — SODIUM CHLORIDE 0.9% FLUSH: 3.0000 mL | INTRAVENOUS | Status: DC | PRN

## 2015-08-13 MED ORDER — CLOPIDOGREL BISULFATE 75 MG PO TABS
75.0000 mg | ORAL_TABLET | Freq: Every day | ORAL | Status: DC
  Administered 2015-08-14: 08:00:00 75 mg via ORAL
  Filled 2015-08-13: qty 1

## 2015-08-13 MED ORDER — LIDOCAINE HCL (PF) 1 % IJ SOLN
INTRAMUSCULAR | Status: AC
  Filled 2015-08-13: qty 30

## 2015-08-13 MED ORDER — FENTANYL CITRATE (PF) 100 MCG/2ML IJ SOLN
INTRAMUSCULAR | Status: AC
  Filled 2015-08-13: qty 2

## 2015-08-13 MED ORDER — SODIUM CHLORIDE 0.9% FLUSH
3.0000 mL | Freq: Two times a day (BID) | INTRAVENOUS | Status: DC
  Administered 2015-08-13: 19:00:00 3 mL via INTRAVENOUS

## 2015-08-13 MED ORDER — ASPIRIN 81 MG PO CHEW: 324.0000 mg | CHEWABLE_TABLET | ORAL | Status: AC

## 2015-08-13 MED ORDER — HEPARIN SODIUM (PORCINE) 1000 UNIT/ML IJ SOLN
INTRAMUSCULAR | Status: AC
  Filled 2015-08-13: qty 1

## 2015-08-13 MED ORDER — AZATHIOPRINE 50 MG PO TABS
100.0000 mg | ORAL_TABLET | Freq: Two times a day (BID) | ORAL | Status: DC
  Administered 2015-08-13 – 2015-08-14 (×2): 100 mg via ORAL
  Filled 2015-08-13 (×2): qty 2

## 2015-08-13 MED ORDER — ACETAMINOPHEN 325 MG PO TABS
650.0000 mg | ORAL_TABLET | ORAL | Status: DC | PRN
  Administered 2015-08-13 (×2): 650 mg via ORAL

## 2015-08-13 MED ORDER — CARVEDILOL 12.5 MG PO TABS
12.5000 mg | ORAL_TABLET | Freq: Two times a day (BID) | ORAL | Status: DC
  Administered 2015-08-13 – 2015-08-14 (×2): 12.5 mg via ORAL
  Filled 2015-08-13 (×2): qty 1

## 2015-08-13 MED ORDER — IOPAMIDOL (ISOVUE-370) INJECTION 76%
INTRAVENOUS | Status: DC | PRN
  Administered 2015-08-13: 89 mL

## 2015-08-13 MED ORDER — ASPIRIN 81 MG PO CHEW
324.0000 mg | CHEWABLE_TABLET | Freq: Once | ORAL | Status: AC
  Administered 2015-08-13: 324 mg via ORAL
  Filled 2015-08-13: qty 4

## 2015-08-13 MED ORDER — HEPARIN (PORCINE) IN NACL 2-0.9 UNIT/ML-% IJ SOLN
INTRAMUSCULAR | Status: DC | PRN
  Administered 2015-08-13: 15:00:00

## 2015-08-13 MED ORDER — FUROSEMIDE 40 MG PO TABS
40.0000 mg | ORAL_TABLET | Freq: Every day | ORAL | Status: DC
  Administered 2015-08-14: 40 mg via ORAL
  Filled 2015-08-13: qty 1

## 2015-08-13 MED ORDER — ANGIOPLASTY BOOK
Freq: Once | Status: AC
  Administered 2015-08-13: 21:00:00
  Filled 2015-08-13: qty 1

## 2015-08-13 MED ORDER — GABAPENTIN 300 MG PO CAPS
300.0000 mg | ORAL_CAPSULE | Freq: Two times a day (BID) | ORAL | Status: DC
  Administered 2015-08-13 – 2015-08-14 (×2): 300 mg via ORAL
  Filled 2015-08-13 (×2): qty 1

## 2015-08-13 MED ORDER — NITROGLYCERIN 0.4 MG SL SUBL: 0.4000 mg | SUBLINGUAL_TABLET | SUBLINGUAL | Status: DC | PRN

## 2015-08-13 MED ORDER — ISOSORBIDE MONONITRATE ER 30 MG PO TB24
30.0000 mg | ORAL_TABLET | Freq: Every day | ORAL | Status: DC
  Administered 2015-08-13 – 2015-08-14 (×2): 30 mg via ORAL
  Filled 2015-08-13 (×2): qty 1

## 2015-08-13 MED ORDER — OXYCODONE-ACETAMINOPHEN 5-325 MG PO TABS
1.0000 | ORAL_TABLET | ORAL | Status: DC | PRN
  Administered 2015-08-14: 02:00:00 2 via ORAL
  Filled 2015-08-13: qty 2

## 2015-08-13 MED ORDER — HEPARIN BOLUS VIA INFUSION
4000.0000 [IU] | Freq: Once | INTRAVENOUS | Status: AC
  Administered 2015-08-13: 4000 [IU] via INTRAVENOUS
  Filled 2015-08-13: qty 4000

## 2015-08-13 MED ORDER — ATORVASTATIN CALCIUM 80 MG PO TABS
80.0000 mg | ORAL_TABLET | Freq: Every day | ORAL | Status: DC
Start: 2015-08-13 — End: 2015-08-14
  Administered 2015-08-13: 17:00:00 80 mg via ORAL
  Filled 2015-08-13: qty 1

## 2015-08-13 MED ORDER — ASPIRIN 300 MG RE SUPP
300.0000 mg | RECTAL | Status: AC
  Filled 2015-08-13: qty 1

## 2015-08-13 MED ORDER — HYDRALAZINE HCL 20 MG/ML IJ SOLN
10.0000 mg | Freq: Four times a day (QID) | INTRAMUSCULAR | Status: DC | PRN
  Administered 2015-08-13 – 2015-08-14 (×2): 10 mg via INTRAVENOUS
  Filled 2015-08-13 (×2): qty 1

## 2015-08-13 MED ORDER — SODIUM CHLORIDE 0.9 % WEIGHT BASED INFUSION: 3.0000 mL/kg/h | INTRAVENOUS | Status: AC

## 2015-08-13 MED ORDER — VERAPAMIL HCL 2.5 MG/ML IV SOLN
INTRAVENOUS | Status: DC | PRN
  Administered 2015-08-13 (×3): 100 ug via INTRACORONARY

## 2015-08-13 MED ORDER — HEPARIN (PORCINE) IN NACL 2-0.9 UNIT/ML-% IJ SOLN
INTRAMUSCULAR | Status: AC
  Filled 2015-08-13: qty 1500

## 2015-08-13 MED ORDER — LIDOCAINE HCL (PF) 1 % IJ SOLN
INTRAMUSCULAR | Status: DC | PRN
  Administered 2015-08-13: 3 mL via SUBCUTANEOUS

## 2015-08-13 MED ORDER — VERAPAMIL HCL 2.5 MG/ML IV SOLN
INTRAVENOUS | Status: AC
  Filled 2015-08-13: qty 2

## 2015-08-13 MED ORDER — ONDANSETRON HCL 4 MG/2ML IJ SOLN: 4.0000 mg | Freq: Four times a day (QID) | INTRAMUSCULAR | Status: DC | PRN

## 2015-08-13 MED ORDER — ASPIRIN EC 81 MG PO TBEC: 81.0000 mg | DELAYED_RELEASE_TABLET | Freq: Every day | ORAL | Status: DC

## 2015-08-13 MED ORDER — HEPARIN SODIUM (PORCINE) 1000 UNIT/ML IJ SOLN
INTRAMUSCULAR | Status: DC | PRN
  Administered 2015-08-13: 4000 [IU] via INTRAVENOUS
  Administered 2015-08-13: 6000 [IU] via INTRAVENOUS

## 2015-08-13 MED ORDER — MIDAZOLAM HCL 2 MG/2ML IJ SOLN
INTRAMUSCULAR | Status: AC
  Filled 2015-08-13: qty 2

## 2015-08-13 MED ORDER — NITROGLYCERIN 2 % TD OINT
1.0000 [in_us] | TOPICAL_OINTMENT | Freq: Once | TRANSDERMAL | Status: AC
  Administered 2015-08-13: 1 [in_us] via TOPICAL
  Filled 2015-08-13: qty 1

## 2015-08-13 MED ORDER — AMLODIPINE BESYLATE 10 MG PO TABS
10.0000 mg | ORAL_TABLET | Freq: Every day | ORAL | Status: DC
  Administered 2015-08-13 – 2015-08-14 (×2): 10 mg via ORAL
  Filled 2015-08-13 (×2): qty 1

## 2015-08-13 MED ORDER — HEART ATTACK BOUNCING BOOK
Freq: Once | Status: AC
  Administered 2015-08-13: 21:00:00
  Filled 2015-08-13: qty 1

## 2015-08-13 MED ORDER — CLOPIDOGREL BISULFATE 300 MG PO TABS
ORAL_TABLET | ORAL | Status: DC | PRN
  Administered 2015-08-13: 600 mg via ORAL

## 2015-08-13 MED ORDER — SODIUM CHLORIDE 0.9 % IV SOLN: 250.0000 mL | INTRAVENOUS | Status: DC | PRN

## 2015-08-13 MED ORDER — HEPARIN (PORCINE) IN NACL 100-0.45 UNIT/ML-% IJ SOLN
1200.0000 [IU]/h | INTRAMUSCULAR | Status: DC
  Administered 2015-08-13: 1200 [IU]/h via INTRAVENOUS
  Filled 2015-08-13: qty 250

## 2015-08-13 MED ORDER — CLOPIDOGREL BISULFATE 300 MG PO TABS
ORAL_TABLET | ORAL | Status: AC
  Filled 2015-08-13: qty 2

## 2015-08-13 MED ORDER — IOPAMIDOL (ISOVUE-370) INJECTION 76%
INTRAVENOUS | Status: AC
  Filled 2015-08-13: qty 125

## 2015-08-13 MED ORDER — VERAPAMIL HCL 2.5 MG/ML IV SOLN
INTRAVENOUS | Status: DC | PRN
  Administered 2015-08-13: 14:00:00 via INTRA_ARTERIAL

## 2015-08-13 MED ORDER — FENTANYL CITRATE (PF) 100 MCG/2ML IJ SOLN
INTRAMUSCULAR | Status: DC | PRN
  Administered 2015-08-13: 25 ug via INTRAVENOUS

## 2015-08-13 MED ORDER — MIDAZOLAM HCL 2 MG/2ML IJ SOLN
INTRAMUSCULAR | Status: DC | PRN
  Administered 2015-08-13: 1 mg via INTRAVENOUS

## 2015-08-13 SURGICAL SUPPLY — 20 items
BALLN ANGIOSCULPT RX 3.0X10 (BALLOONS) ×3
BALLN EUPHORA RX 3.0X15 (BALLOONS) ×3
BALLN ~~LOC~~ EUPHORA RX 4.0X8 (BALLOONS) ×3
BALLOON ANGIOSCULPT RX 3.0X10 (BALLOONS) ×1 IMPLANT
BALLOON EUPHORA RX 3.0X15 (BALLOONS) ×1 IMPLANT
BALLOON ~~LOC~~ EUPHORA RX 4.0X8 (BALLOONS) ×1 IMPLANT
CATH INFINITI 5 FR IM (CATHETERS) ×3 IMPLANT
CATH INFINITI 5FR AL1 (CATHETERS) ×3 IMPLANT
CATH INFINITI 5FR MULTPACK ANG (CATHETERS) ×3 IMPLANT
CATH VISTA GUIDE 6FR AL1 (CATHETERS) ×3 IMPLANT
DEVICE RAD COMP TR BAND LRG (VASCULAR PRODUCTS) ×3 IMPLANT
GLIDESHEATH SLEND SS 6F .021 (SHEATH) ×3 IMPLANT
KIT ENCORE 26 ADVANTAGE (KITS) ×3 IMPLANT
KIT HEART LEFT (KITS) ×3 IMPLANT
PACK CARDIAC CATHETERIZATION (CUSTOM PROCEDURE TRAY) ×3 IMPLANT
STENT RESOLUTE INTEG 3.5X12 (Permanent Stent) ×3 IMPLANT
TRANSDUCER W/STOPCOCK (MISCELLANEOUS) ×3 IMPLANT
TUBING CIL FLEX 10 FLL-RA (TUBING) ×3 IMPLANT
WIRE COUGAR XT STRL 190CM (WIRE) ×3 IMPLANT
WIRE SAFE-T 1.5MM-J .035X260CM (WIRE) ×3 IMPLANT

## 2015-08-13 NOTE — Progress Notes (Signed)
ANTICOAGULATION CONSULT NOTE - Initial Consult  Pharmacy Consult for heparin Indication: chest pain/ACS  Allergies  Allergen Reactions  . Doxazosin Mesylate Other (See Comments)    dizziness  . Methocarbamol Rash    Patient Measurements: Height: 6\' 1"  (185.4 cm) Weight: 221 lb 12.8 oz (100.608 kg) IBW/kg (Calculated) : 79.9 Heparin Dosing Weight: 100kg  Vital Signs: Temp: 97.6 F (36.4 C) (05/10 1002) Temp Source: Oral (05/10 1002) BP: 143/74 mmHg (05/10 1100) Pulse Rate: 70 (05/10 1100)  Labs:  Recent Labs  08/13/15 1010 08/13/15 1012  HGB  --  12.1*  HCT  --  37.6*  PLT  --  200  APTT 29  --   LABPROT 14.6  --   INR 1.12  --     CrCl cannot be calculated (Patient has no serum creatinine result on file.).   Medical History: Past Medical History  Diagnosis Date  . Hyperlipidemia   . Hypertension   . GERD (gastroesophageal reflux disease)   . Overweight (BMI 25.0-29.9)     BMI 29  . Interstitial lung disease ([name])     NOS  . Diverticulosis   . Esophageal stricture     a. s/p dilation spring 2010  . CAD (coronary artery disease)     a. s/p CABG (2001)  b. s/p DES to RCA and cutting POBA to ostial PDA (2013)   c. s/p DES to SVG to OM2 (01/14/14)  . History of PFTs     mixed pattern on spiro. mild restn on lung volumes with near normal DLCO. Pattern can be explained by CABG scar. Fev1 2.2L/73%, ratio 68 (67), TLC 4.7/68%,RV 1.5L/55%,DLCO 79%  . Colon polyps   . Chronic kidney disease, stage III (moderate)   . Chronic diastolic CHF (congestive heart failure) ([place])     a) 09/13 ECHO- LVEF 99991111, grade 1 diastolic dysfunction, mild LA dilatation, atrial septal aneurysm, AV mobility restricted, but no sig AS by doppler; b) 09/04/08 ECHO- LVH, ef 60%, mild AS,  . Heart murmur   . Chronic lower back pain   . Dyspnea 2009 since July -Sept    05/06/08-CPST-  normal effort, reduced VO2 max 20.5 /65%, reduced at 8.2/ 40%, normal breathing resetvca of 55%, submaximal  heart rate response 112/77%, flattened o2 pluse response at peak exercise-12 ml/beat @ 85%, No VQ mismatch abnormalities, All c/w CIRC Limitation  . Hiatal hernia   . Type II diabetes mellitus ([place])   . Arthritis     osteoarthritis, s/p R TKR, and digits  . RA (rheumatoid arthritis) ([name])     [name]  . Seropositive rheumatoid arthritis ([place])   . Enlarged prostate     Medications:  Infusions:  . heparin      Assessment: 34 yom presented to the ED with check pain. Found to have an elevated troponin. Baseline CBC is WNL and he is not on anticoagulation PTA.   Goal of Therapy:  Heparin level 0.3-0.7 units/ml Monitor platelets by anticoagulation protocol: Yes   Plan:  - Heparin bolus 4000 units IV x 1 - Heparin gtt 1200 units/hr - Check an 8 hour heparin level - Daily heparin level and CBC  Zuleyma Scharf, Rande Lawman 08/13/2015,11:31 AM

## 2015-08-13 NOTE — Telephone Encounter (Signed)
Left message for pt to call back  °

## 2015-08-13 NOTE — Progress Notes (Signed)
CRITICAL VALUE ALERT  Critical value received:  Troponin=0.50  Date of notification:  08/13/15  Time of notification:  19:17  Critical value read back:Yes.    Nurse who received alert:  Vita Erm RN  MD notified (1st page):  PA Rosita Fire  Time of first page:  19:20  MD notified (2nd page):  Time of second page:  Responding MD:  PA Rosita Fire  Time MD responded:  19:21

## 2015-08-13 NOTE — Telephone Encounter (Signed)
-----   Message from Irene Shipper, MD sent at 08/13/2015 10:05 AM EDT ----- Regarding: FW: ASA IV pt [name] sent me a phone note on this gentleman regarding possible direct procedure for dysphagia. I said okay if there were no other issues. Unfortunately, [name] noticed that he has end-stage lung disease and is on oxygen therapy at home. Thus, He would not be appropriate for endoscopy here, or possibly anywhere. Thus, set him up for a barium esophagram with tablet and subsequent office follow-up thereafter. Obviously, cancel [facility] previsit and procedure date. Thank you [name]  ----- Message -----    From: Osvaldo Angst, CRNA    Sent: 08/12/2015   5:50 PM      To: Irene Shipper, MD Subject: ASA IV pt                                      [name],  This pt has severe COPD and uses O2 at home;consequently, he unfortunately does not qualify for care at [facility].  Regards,  Osvaldo Angst

## 2015-08-13 NOTE — Telephone Encounter (Signed)
I spoke with [name] signed) and they are at [facility] ED now.

## 2015-08-13 NOTE — Interval H&P Note (Signed)
History and Physical Interval Note:  08/13/2015 3:37 PM  [name]  has presented today for surgery, with the diagnosis of cp  The various methods of treatment have been discussed with the patient and family. After consideration of risks, benefits and other options for treatment, the patient has consented to  Procedure(s): Left Heart Cath and Cors/Grafts Angiography (N/A) as a surgical intervention .  The patient's history has been reviewed, patient examined, no change in status, stable for surgery.  I have reviewed the patient's chart and labs.  Questions were answered to the patient's satisfaction.    Cath Lab Visit (complete for each Cath Lab visit)  Clinical Evaluation Leading to the Procedure:   ACS: Yes.    Non-ACS:    Anginal Classification: CCS IV  Anti-ischemic medical therapy: Maximal Therapy (2 or more classes of medications)  Non-Invasive Test Results: No non-invasive testing performed  Prior CABG: Previous CABG       [name]

## 2015-08-13 NOTE — ED Provider Notes (Signed)
CSN: [id number]     Arrival date & time 08/13/15  0957 History   First MD Initiated Contact with Patient 08/13/15 1014     Chief Complaint  Patient presents with  . Chest Pain    Patient is a 82 y.o. male presenting with chest pain. The history is provided by the patient and a relative.  Chest Pain Pain location:  Substernal area Pain quality: pressure   Pain radiates to:  Does not radiate Pain severity:  Moderate Onset quality:  Gradual Duration:  1 week Timing:  Intermittent Progression:  Worsening Chronicity:  New Relieved by:  Rest Worsened by:  Exertion Associated symptoms: lower extremity edema and shortness of breath   Associated symptoms: no abdominal pain, no cough, no fever, no syncope and not vomiting   Risk factors: coronary artery disease   Patient with h/o CAD/hypertension presents with chest pressure on/off for past week but worse over past 24 hours It is now occurring at rest He did have CP recently with exertion but now occurring at rest No fever/vomiting He reports SOB He reports LE edema He is a nonsmoker   Past Medical History  Diagnosis Date  . Hyperlipidemia   . Hypertension   . GERD (gastroesophageal reflux disease)   . Overweight (BMI 25.0-29.9)     BMI 29  . Interstitial lung disease ([name])     NOS  . Diverticulosis   . Esophageal stricture     a. s/p dilation spring 2010  . CAD (coronary artery disease)     a. s/p CABG (2001)  b. s/p DES to RCA and cutting POBA to ostial PDA (2013)   c. s/p DES to SVG to OM2 (01/14/14)  . History of PFTs     mixed pattern on spiro. mild restn on lung volumes with near normal DLCO. Pattern can be explained by CABG scar. Fev1 2.2L/73%, ratio 68 (67), TLC 4.7/68%,RV 1.5L/55%,DLCO 79%  . Colon polyps   . Chronic kidney disease, stage III (moderate)   . Chronic diastolic CHF (congestive heart failure) ([place])     a) 09/13 ECHO- LVEF 99991111, grade 1 diastolic dysfunction, mild LA dilatation, atrial septal aneurysm,  AV mobility restricted, but no sig AS by doppler; b) 09/04/08 ECHO- LVH, ef 60%, mild AS,  . Heart murmur   . Chronic lower back pain   . Dyspnea 2009 since July -Sept    05/06/08-CPST-  normal effort, reduced VO2 max 20.5 /65%, reduced at 8.2/ 40%, normal breathing resetvca of 55%, submaximal heart rate response 112/77%, flattened o2 pluse response at peak exercise-12 ml/beat @ 85%, No VQ mismatch abnormalities, All c/w CIRC Limitation  . Hiatal hernia   . Type II diabetes mellitus ([place])   . Arthritis     osteoarthritis, s/p R TKR, and digits  . RA (rheumatoid arthritis) ([name])     [name]  . Seropositive rheumatoid arthritis ([place])   . Enlarged prostate    Past Surgical History  Procedure Laterality Date  . Total knee arthroplasty Right 03/2010    [name]  . Knee arthroscopy Right 2008  . Cataract extraction w/ intraocular lens  implant, bilateral Bilateral   . Coronary artery bypass graft  11/1999    CABG X5  . Coronary stent placement  02/2012    1 stent and balloon  . Shoulder arthroscopy with open rotator cuff repair and distal clavicle acrominectomy Left 02/27/2013    Procedure: LEFT SHOULDER ARTHROSCOPY WITH MINI OPEN ROTATOR CUFF REPAIR AND SUBACROMIAL DECOMPRESSION  AND DISTAL CLAVICLE RESECTION;  Surgeon: [name], MD;  Location: [place];  Service: Orthopedics;  Laterality: Left;  . Trigger finger release Left 02/27/2013    Procedure: RELEASE TRIGGER FINGER/A-1 PULLEY;  Surgeon: [name], MD;  Location: [place];  Service: Orthopedics;  Laterality: Left;  . Cholecystectomy open  11/2003    [name]  . Joint replacement    . Esophagogastroduodenoscopy (egd) with esophageal dilation  2010  . Cardiac catheterization  08/2004    CP- no MI, Cath- small vessell disease   . Cardiac catheterization  12/31/2011    80% distal LM, 100% native LAD, LCx and RCA, 30% prox SVG-OM, SVG-D1 normal, 99% distal, 80% ostial SVG-RCA distal to graft, LIMA-LAD normal; LVEF mildly  decreased with posterior basal AK   . Coronary angioplasty with stent placement  01/03/2012    Successful DES to SVG-RCA and cutting balloon angioplasty ostial  PDA   . Coronary angioplasty with stent placement  01/14/2014    "1"  . Cardiac catheterization  2009    with patent grafts/notes 12/31/2011  . Left and right heart catheterization with coronary angiogram  12/31/2011    Procedure: LEFT AND RIGHT HEART CATHETERIZATION WITH CORONARY ANGIOGRAM;  Surgeon: Burnell Blanks, MD;  Location: [facility] CATH LAB;  Service: Cardiovascular;;  . Percutaneous coronary stent intervention (pci-s)  12/31/2011    Procedure: PERCUTANEOUS CORONARY STENT INTERVENTION (PCI-S);  Surgeon: Burnell Blanks, MD;  Location: [facility] CATH LAB;  Service: Cardiovascular;;  . Percutaneous coronary stent intervention (pci-s) N/A 01/03/2012    Procedure: PERCUTANEOUS CORONARY STENT INTERVENTION (PCI-S);  Surgeon: Peter M Martinique, MD;  Location: [facility] CATH LAB;  Service: Cardiovascular;  Laterality: N/A;  . Percutaneous coronary intervention-balloon only  01/03/2012    Procedure: PERCUTANEOUS CORONARY INTERVENTION-BALLOON ONLY;  Surgeon: Peter M Martinique, MD;  Location: [facility] CATH LAB;  Service: Cardiovascular;;  . Left and right heart catheterization with coronary angiogram N/A 01/14/2014    Procedure: LEFT AND RIGHT HEART CATHETERIZATION WITH CORONARY ANGIOGRAM;  Surgeon: Peter M Martinique, MD;  Location: [facility] CATH LAB;  Service: Cardiovascular;  Laterality: N/A;  . Hand surgery     Family History  Problem Relation Age of Onset  . COPD Mother   . Heart disease Father   . Diabetes Brother   . Colon cancer Brother 25  . Alcohol abuse Sister   . Heart attack Father   . Stroke Sister    Social History  Substance Use Topics  . Smoking status: Former Smoker -- 1.00 packs/day for 20 years    Types: Cigarettes    Quit date: 04/06/1963  . Smokeless tobacco: Never Used  . Alcohol Use: No     Comment: 01/01/2012 "last alcohol ~ 50 yr  ago"    Review of Systems  Constitutional: Negative for fever.  Respiratory: Positive for shortness of breath. Negative for cough.   Cardiovascular: Positive for chest pain and leg swelling. Negative for syncope.  Gastrointestinal: Negative for vomiting, abdominal pain and blood in stool.  Neurological: Negative for syncope.  All other systems reviewed and are negative.     Allergies  Doxazosin mesylate and Methocarbamol  Home Medications   Prior to Admission medications   Medication Sig Start Date End Date Taking? Authorizing Provider  amLODipine (NORVASC) 10 MG tablet TAKE 1 TABLET BY MOUTH DAILY 05/19/15   [name], MD  aspirin EC 81 MG tablet Take 81 mg by mouth at bedtime.    Historical Provider, MD  azaTHIOprine Ilean Skill)  50 MG tablet Take 100 mg by mouth 2 (two) times daily.     Historical Provider, MD  carvedilol (COREG) 12.5 MG tablet TAKE 1 TABLET BY MOUTH TWICE A DAY WITH MEALS 02/06/15   [name], MD  desipramine (NOPRAMIN) 10 MG tablet Take 1 tablet at bedtime for two weeks, then increase to 2 tablets. 06/13/15   Donika Keith Rake, DO  DHA-Vitamin C-Lutein (EYE HEALTH FORMULA PO) Take 1 tablet by mouth daily.     Historical Provider, MD  furosemide (LASIX) 40 MG tablet TAKE ONE OR TWO TABLETS BY MOUTH EVERY DAY. 01/07/15   [name], MD  gabapentin (NEURONTIN) 300 MG capsule TAKE 3 CAPSULES BY MOUTH EVERY MORNING AND TAKE 4 TO 5 CAPSULES BY MOUTH EVERY NIGHT AT BEDTIM 08/06/15   [name], MD  isosorbide mononitrate (IMDUR) 30 MG 24 hr tablet TAKE 1 TABLET BY MOUTH DAILY 02/06/15   [name], MD  losartan (COZAAR) 100 MG tablet TAKE 1 TABLET BY MOUTH DAILY 05/06/15   [name], MD  metFORMIN (GLUCOPHAGE) 1000 MG tablet Take 1 tablet (1,000 mg total) by mouth 2 (two) times daily with a meal. 02/14/15   [name], MD  Multiple Vitamin (MULTIVITAMIN WITH MINERALS) TABS tablet Take 1 tablet by mouth daily.    Historical Provider, MD   nitroGLYCERIN (NITROSTAT) 0.4 MG SL tablet DISSOLVE 1 TABLET UNDER THE TONGUE FOR CHEST PAIN. MAY REPEAT EVERY 5MINUTES UP TO 3 DOSES. IF NO RELIEF, CALL 911** 07/28/15   [name], MD  pravastatin (PRAVACHOL) 40 MG tablet TAKE 1 TABLET BY MOUTH DAILY 08/06/15   [name], MD   BP 154/76 mmHg  Pulse 72  Temp(Src) 97.6 F (36.4 C) (Oral)  Resp 26  Ht 6\' 1"  (1.854 m)  Wt 102.059 kg  BMI 29.69 kg/m2  SpO2 95% Physical Exam CONSTITUTIONAL: Well developed/well nourished HEAD: Normocephalic/atraumatic EYES: EOMI ENMT: Mucous membranes moist NECK: supple no meningeal signs SPINE/BACK:entire spine nontender CV: S1/S2 noted, there are no harsh sounding murmurs LUNGS: coarse BS noted bilaterally, no apparent distress ABDOMEN: soft, nontender NEURO: Pt is awake/alert/appropriate, moves all extremitiesx4.  No facial droop.   EXTREMITIES: pulses normal/equalx4, full ROM SKIN: warm, color normal PSYCH: no abnormalities of mood noted, alert and oriented to situation  ED Course  Procedures  CRITICAL CARE Performed by: Sharyon Cable Total critical care time: 35 minutes Critical care time was exclusive of separately billable procedures and treating other patients. Critical care was necessary to treat or prevent imminent or life-threatening deterioration. Critical care was time spent personally by me on the following activities: development of treatment plan with patient and/or surrogate as well as nursing, discussions with consultants, evaluation of patient's response to treatment, examination of patient, obtaining history from patient or surrogate, ordering and performing treatments and interventions, ordering and review of laboratory studies, ordering and review of radiographic studies, pulse oximetry and re-evaluation of patient's condition. PATIENT WITH ACUTE EKG CHANGES, PATIENT HAS NON-STEMI, PT WAS PLACED ON HEPARIN AND NITROGLYCERIN AND WILL NEED ADMISSION TO CARDIOLOGY AND HE  MAY REQUIRE CARDIAC CATHETERIZATION  10:58 AM D/w dr Claiborne Billings with cardiology He has reviewed EKGs (concern for posterior MI) He does not feel this represents STEMI Consult gen. Cardiology and likely cath later today 11:37 AM Pt with long h/o CAD per chart He has CABG per chart D/w general cardiology and will evaluate patient for admission 11:58 AM Pt stable He denies any CP at this time  He feels improved Will admit for Non-stemi BP 148/89 mmHg  Pulse 66  Temp(Src) 97.6 F (36.4 C) (Oral)  Resp 18  Ht 6\' 1"  (1.854 m)  Wt 100.608 kg  BMI 29.27 kg/m2  SpO2 95%  Labs Review Labs Reviewed  CBC - Abnormal; Notable for the following:    Hemoglobin 12.1 (*)    HCT 37.6 (*)    RDW 15.6 (*)    All other components within normal limits  I-STAT TROPOININ, ED - Abnormal; Notable for the following:    Troponin i, poc 0.42 (*)    All other components within normal limits  APTT  PROTIME-INR  TROPONIN I  BASIC METABOLIC PANEL  HEPARIN LEVEL (UNFRACTIONATED)    Imaging Review Dg Chest 2 View  08/13/2015  CLINICAL DATA:  Chest pain starting this morning. EXAM: CHEST  2 VIEW COMPARISON:  07/08/2015 FINDINGS: Prior CABG.  Upper normal heart size.  Atherosclerotic aortic arch. Thoracic spondylosis. No pleural effusion or airspace opacity. Minimal scarring at the lung bases. IMPRESSION: 1. Prior CABG with upper normal heart size but no edema. 2. Mild bibasilar scarring. 3. Atherosclerotic aortic arch. Electronically Signed   By: Van Clines M.D.   On: 08/13/2015 10:58   I have personally reviewed and evaluated these images and lab results as part of my medical decision-making.   EKG Interpretation   Date/Time:  Wednesday Aug 13 2015 10:02:24 EDT Ventricular Rate:  77 PR Interval:  164 QRS Duration: 102 QT Interval:  408 QTC Calculation: 461 R Axis:     Text Interpretation:  Normal sinus rhythm ST \\T \ T wave abnormality,  consider inferolateral ischemia Prolonged QT Abnormal  ECG significant  changes from prior Confirmed by Christy Gentles  MD, [place] ([id number]) on 08/13/2015  10:50:45 AM      EKG Interpretation  Date/Time:  Wednesday Aug 13 2015 10:49:20 EDT Ventricular Rate:  70 PR Interval:  162 QRS Duration: 110 QT Interval:  417 QTC Calculation: 450 R Axis:   -17 Text Interpretation:  Sinus rhythm Atrial premature complex Incomplete left bundle branch block st  depression in V2/V3 improved from prior Confirmed by [facility]  MD, [place] ([id number]) on 08/13/2015 10:52:49 AM      Medications  heparin ADULT infusion 100 units/mL (25000 units/250 mL) (1,200 Units/hr Intravenous New Bag/Given 08/13/15 1153)  aspirin chewable tablet 324 mg (324 mg Oral Given 08/13/15 1102)  nitroGLYCERIN (NITROGLYN) 2 % ointment 1 inch (1 inch Topical Given 08/13/15 1102)  heparin bolus via infusion 4,000 Units (4,000 Units Intravenous Given 08/13/15 1153)    MDM   Final diagnoses:  Non-STEMI (non-ST elevated myocardial infarction) [facility])    Nursing notes including past medical history and social history reviewed and considered in documentation xrays/imaging reviewed by myself and considered during evaluation Patient maintained in spinal precautions/logroll utilized Previous records reviewed and considered     Ripley Fraise, MD 08/13/15 1158

## 2015-08-13 NOTE — ED Notes (Signed)
Pt states began having substernal chest pain yesterday while putting up shelving with his wife. States he got some relief with nitro. Chest pain returned this AM. Pt resting in bed, tachypneic, lungs CTA, +1 edema to lower extremity.

## 2015-08-13 NOTE — Telephone Encounter (Signed)
Patient Name: [name]  DOB: 25-Jul-1931    Initial Comment Caller states has a hernia, hurting all the way up in chest, hard to breathe   Nurse Assessment  Nurse: Julien Girt, RN, [place] Free Date/Time Eilene Ghazi Time): 08/13/2015 9:27:17 AM  Confirm and document reason for call. If symptomatic, describe symptoms. You must click the next button to save text entered. ---Caller states he has a hernia and this morning it is hurting all the way up in his chest and it is hard to breathe. States he has hurting for awhile, sx worsened on Tuesday. The hernia is bulging out.  Has the patient traveled out of the country within the last 30 days? ---Not Applicable  Does the patient have any new or worsening symptoms? ---Yes  Will a triage be completed? ---Yes  Related visit to physician within the last 2 weeks? ---No  Does the PT have any chronic conditions? (i.e. diabetes, asthma, etc.) ---Yes  List chronic conditions. ---Umbilical Hernia, Htn, Diabetes, Cardiac Stent/CABG, Hx sob  Is this a behavioral health or substance abuse call? ---No     Guidelines    Guideline Title Affirmed Question Affirmed Notes  Hernia Hernia is painful or tender to touch   Chest Pain [1] Chest pain lasts > 5 minutes AND [2] history of heart disease (i.e., heart attack, bypass surgery, angina, angioplasty, CHF; not just a heart murmur)    Final Disposition User   Call EMS 911 Now Julien Girt, RN, [place] Free    Comments  Per EPIC patient has a hiatal hernia w/GERD and esophageal stricture. Error documenting "umbilical hernia"   Referrals  [facility] - ED       Disagree/Comply: Comply  08/13/2015 9:35:52 AM 911 Outcome Documentation Julien Girt, RN, [place]myra Free Reason: Caller refuses to call 911, states his heart doctor and lung doctor say this chest pain is not cardiac related and he needs his esophagus dilated. He did verbalize understanding of all instructions and will cb as needed.

## 2015-08-13 NOTE — Progress Notes (Signed)
TR BAND REMOVAL  LOCATION:  left radial  DEFLATED PER PROTOCOL:  Yes.    TIME BAND OFF / DRESSING APPLIED:   1900   SITE UPON ARRIVAL:   Level 0  SITE AFTER BAND REMOVAL:  Level 0  CIRCULATION SENSATION AND MOVEMENT:  Within Normal Limits  Yes.    COMMENTS:

## 2015-08-13 NOTE — Telephone Encounter (Signed)
That could be a lot of things---including cardiac Glad he is in the ER---will review his visit when done

## 2015-08-13 NOTE — ED Notes (Signed)
Wheeled pt back to room from waiting room. 

## 2015-08-13 NOTE — ED Notes (Signed)
Pt reports generalized CP starting last night and worsening this morning. Pt reports the pain has eased off but is still present.

## 2015-08-13 NOTE — H&P (Signed)
Patient ID: [name] MRN: [id number], DOB/AGE: [birth year]   Admit date: 08/13/2015   Primary Physician: Viviana Simpler, MD Primary Cardiologist: Dr. Johnsie Cancel   Pt. Profile:  82 y/o male with known CAD s/p CABG in 2001 with multiple PCIs as outlined below, mild AS, HTN, HLD, DM and CKD, not on HD, presenting to ED with CP and + troponin of 0.49.   Problem List  Past Medical History  Diagnosis Date  . Hyperlipidemia   . Hypertension   . GERD (gastroesophageal reflux disease)   . Overweight (BMI 25.0-29.9)     BMI 29  . Interstitial lung disease ([name])     NOS  . Diverticulosis   . Esophageal stricture     a. s/p dilation spring 2010  . CAD (coronary artery disease)     a. s/p CABG (2001)  b. s/p DES to RCA and cutting POBA to ostial PDA (2013)   c. s/p DES to SVG to OM2 (01/14/14)  . History of PFTs     mixed pattern on spiro. mild restn on lung volumes with near normal DLCO. Pattern can be explained by CABG scar. Fev1 2.2L/73%, ratio 68 (67), TLC 4.7/68%,RV 1.5L/55%,DLCO 79%  . Colon polyps   . Chronic kidney disease, stage III (moderate)   . Chronic diastolic CHF (congestive heart failure) ([place])     a) 09/13 ECHO- LVEF [id number], grade 1 diastolic dysfunction, mild LA dilatation, atrial septal aneurysm, AV mobility restricted, but no sig AS by doppler; b) 09/04/08 ECHO- LVH, ef 60%, mild AS,  . Heart murmur   . Chronic lower back pain   . Dyspnea 2009 since July -Sept    05/06/08-CPST-  normal effort, reduced VO2 max 20.5 /65%, reduced at 8.2/ 40%, normal breathing resetvca of 55%, submaximal heart rate response 112/77%, flattened o2 pluse response at peak exercise-12 ml/beat @ 85%, No VQ mismatch abnormalities, All c/w CIRC Limitation  . Hiatal hernia   . Type II diabetes mellitus ([place])   . Arthritis     osteoarthritis, s/p R TKR, and digits  . RA (rheumatoid arthritis) ([name])     [name]  . Seropositive rheumatoid arthritis ([place])   . Enlarged prostate     Past  Surgical History  Procedure Laterality Date  . Total knee arthroplasty Right 03/2010    [name]  . Knee arthroscopy Right 2008  . Cataract extraction w/ intraocular lens  implant, bilateral Bilateral   . Coronary artery bypass graft  11/1999    CABG X5  . Coronary stent placement  02/2012    1 stent and balloon  . Shoulder arthroscopy with open rotator cuff repair and distal clavicle acrominectomy Left 02/27/2013    Procedure: LEFT SHOULDER ARTHROSCOPY WITH MINI OPEN ROTATOR CUFF REPAIR AND SUBACROMIAL DECOMPRESSION AND DISTAL CLAVICLE RESECTION;  Surgeon: [name], MD;  Location: [facility];  Service: Orthopedics;  Laterality: Left;  . Trigger finger release Left 02/27/2013    Procedure: RELEASE TRIGGER FINGER/A-1 PULLEY;  Surgeon: [name], MD;  Location: [facility];  Service: Orthopedics;  Laterality: Left;  . Cholecystectomy open  11/2003    [name]  . Joint replacement    . Esophagogastroduodenoscopy (egd) with esophageal dilation  2010  . Cardiac catheterization  08/2004    CP- no MI, Cath- small vessell disease   . Cardiac catheterization  12/31/2011    80% distal LM, 100% native LAD, LCx and RCA, 30% prox SVG-OM, SVG-D1 normal, 99% distal, 80% ostial SVG-RCA  distal to graft, LIMA-LAD normal; LVEF mildly decreased with posterior basal AK   . Coronary angioplasty with stent placement  01/03/2012    Successful DES to SVG-RCA and cutting balloon angioplasty ostial  PDA   . Coronary angioplasty with stent placement  01/14/2014    "1"  . Cardiac catheterization  2009    with patent grafts/notes 12/31/2011  . Left and right heart catheterization with coronary angiogram  12/31/2011    Procedure: LEFT AND RIGHT HEART CATHETERIZATION WITH CORONARY ANGIOGRAM;  Surgeon: Burnell Blanks, MD;  Location: [facility] CATH LAB;  Service: Cardiovascular;;  . Percutaneous coronary stent intervention (pci-s)  12/31/2011    Procedure: PERCUTANEOUS CORONARY STENT INTERVENTION (PCI-S);  Surgeon:  Burnell Blanks, MD;  Location: [facility] CATH LAB;  Service: Cardiovascular;;  . Percutaneous coronary stent intervention (pci-s) N/A 01/03/2012    Procedure: PERCUTANEOUS CORONARY STENT INTERVENTION (PCI-S);  Surgeon: Peter M Martinique, MD;  Location: [facility] CATH LAB;  Service: Cardiovascular;  Laterality: N/A;  . Percutaneous coronary intervention-balloon only  01/03/2012    Procedure: PERCUTANEOUS CORONARY INTERVENTION-BALLOON ONLY;  Surgeon: Peter M Martinique, MD;  Location: [facility] CATH LAB;  Service: Cardiovascular;;  . Left and right heart catheterization with coronary angiogram N/A 01/14/2014    Procedure: LEFT AND RIGHT HEART CATHETERIZATION WITH CORONARY ANGIOGRAM;  Surgeon: Peter M Martinique, MD;  Location: [facility] CATH LAB;  Service: Cardiovascular;  Laterality: N/A;  . Hand surgery       Allergies  Allergies  Allergen Reactions  . Doxazosin Mesylate Other (See Comments)    dizziness  . Methocarbamol Rash    HPI  82 y/o male, followed by Dr. Johnsie Cancel, with a h/o CAD s/p CABG (2001): s/p DES to RCA and cutting POBA to ostial PDA (2013) and DES to the SVG to OM2 (2015), interstitial lung disease, mild aortic stenosis, HTN, HL, DM2, CKD, rheumatoid arthritis and chronic dyspnea. His last echo was 10/2013. This showed normal LVEF of 60% and grade I DD. His AS was noted to be mild. Mean gradient was 14 mmHg. He was last seen by Dr. Johnsie Cancel 06/2015 for routien f/u. He denied CP. Plavix was discontinued as it had been > 56yr post PCI. He was ordered to get a repeat echo in 6 months to reassess his aortic valve.   He presents to the [facility] ED with a complaint of CP. Troponin is also abnormal at 0.49. EKG shows slight inferolateral ST depressions, new compared to prior EKG in 2016. CBC shows chronic renal insuffiencey with SCr at 1.56 which is his baseline. CXR is unremarkable.   The patient notes mid epigastric pain occuring off and on for the past week. No radiation. Occurs with exertion. Relieved with rest and SL NTG.  No relationship with meals. No improvement with antacids. He has also noticed dyspnea with exertion. No resting dyspnea. No n/v/d. No dizziness, syncope/ near syncope. His wife notes that he was diaphoretic earlier today. His discomfort earlier this am was 10/10. It recurred when he was getting dressed this morning. He is now on IV heparin in the ED and is CP free. Last meal was last night.     Home Medications  Prior to Admission medications   Medication Sig Start Date End Date Taking? Authorizing Provider  amLODipine (NORVASC) 10 MG tablet TAKE 1 TABLET BY MOUTH DAILY 05/19/15  Yes [name], MD  aspirin EC 81 MG tablet Take 81 mg by mouth at bedtime.   Yes Historical Provider, MD  azaTHIOprine (IMURAN) 50  MG tablet Take 100 mg by mouth 2 (two) times daily.    Yes Historical Provider, MD  carvedilol (COREG) 12.5 MG tablet TAKE 1 TABLET BY MOUTH TWICE A DAY WITH MEALS 02/06/15  Yes [name], MD  desipramine (NOPRAMIN) 10 MG tablet Take 1 tablet at bedtime for two weeks, then increase to 2 tablets. 06/13/15  Yes Donika Keith Rake, DO  DHA-Vitamin C-Lutein (EYE HEALTH FORMULA PO) Take 1 tablet by mouth daily.    Yes Historical Provider, MD  furosemide (LASIX) 40 MG tablet TAKE ONE OR TWO TABLETS BY MOUTH EVERY DAY. 01/07/15  Yes [name], MD  gabapentin (NEURONTIN) 300 MG capsule TAKE 3 CAPSULES BY MOUTH EVERY MORNING AND TAKE 4 TO 5 CAPSULES BY MOUTH EVERY NIGHT AT BEDTIM 08/06/15  Yes [name], MD  isosorbide mononitrate (IMDUR) 30 MG 24 hr tablet TAKE 1 TABLET BY MOUTH DAILY 02/06/15  Yes [name], MD  losartan (COZAAR) 100 MG tablet TAKE 1 TABLET BY MOUTH DAILY 05/06/15  Yes [name], MD  metFORMIN (GLUCOPHAGE) 1000 MG tablet Take 1 tablet (1,000 mg total) by mouth 2 (two) times daily with a meal. 02/14/15  Yes [name], MD  Multiple Vitamin (MULTIVITAMIN WITH MINERALS) TABS tablet Take 1 tablet by mouth daily.   Yes Historical Provider, MD    nitroGLYCERIN (NITROSTAT) 0.4 MG SL tablet DISSOLVE 1 TABLET UNDER THE TONGUE FOR CHEST PAIN. MAY REPEAT EVERY 5MINUTES UP TO 3 DOSES. IF NO RELIEF, CALL 911** 07/28/15  Yes [name], MD  pravastatin (PRAVACHOL) 40 MG tablet TAKE 1 TABLET BY MOUTH DAILY 08/06/15  Yes [name], MD    Family History  Family History  Problem Relation Age of Onset  . COPD Mother   . Heart disease Father   . Diabetes Brother   . Colon cancer Brother 25  . Alcohol abuse Sister   . Heart attack Father   . Stroke Sister     Social History  Social History   Social History  . Marital Status: Married    Spouse Name: N/A  . Number of Children: 3  . Years of Education: N/A   Occupational History  . lawn mower     retired   Social History Main Topics  . Smoking status: Former Smoker -- 1.00 packs/day for 20 years    Types: Cigarettes    Quit date: 04/06/1963  . Smokeless tobacco: Never Used  . Alcohol Use: No     Comment: 01/01/2012 "last alcohol ~ 50 yr ago"  . Drug Use: No  . Sexual Activity: Not Currently   Other Topics Concern  . Not on file   Social History Narrative   No living will   Requests wife as health care POA   Discussed DNR --he requests this (done 08/29/12)   Not sure about feeding tube---but might accept for some time   Patient lives with wife and daughter in a one story home.  Has 3 children.  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     Review of Systems General:  No chills, fever, night sweats or weight changes.  Cardiovascular:  No chest pain, dyspnea on exertion, edema, orthopnea, palpitations, paroxysmal nocturnal dyspnea. Dermatological: No rash, lesions/masses Respiratory: No cough, dyspnea Urologic: No hematuria, dysuria Abdominal:   No nausea, vomiting, diarrhea, bright red blood per rectum, melena, or hematemesis Neurologic:  No visual changes, wkns, changes in mental status. All other systems reviewed  and are otherwise negative  except as noted above.  Physical Exam  Blood pressure 148/89, pulse 66, temperature 97.6 F (36.4 C), temperature source Oral, resp. rate 18, height 6\' 1"  (1.854 m), weight 221 lb 12.8 oz (100.608 kg), SpO2 95 %.  General: Pleasant, NAD Psych: Normal affect. Neuro: Alert and oriented X 3. Moves all extremities spontaneously. HEENT: Normal  Neck: Supple without bruits or JVD. Lungs:  Faint rales at the bases bilaterally Heart: RRR 2/6 SM, loudest at the RUSB Abdomen: Soft, non-tender, non-distended, BS + x 4.  Extremities: No clubbing, cyanosis or edema. DP/PT/Radials 2+ and equal bilaterally.  Labs  Troponin [facility] Test)  Recent Labs  08/13/15 1022  TROPIPOC 0.42*    Recent Labs  08/13/15 1010  TROPONINI 0.49*   Lab Results  Component Value Date   WBC 7.6 08/13/2015   HGB 12.1* 08/13/2015   HCT 37.6* 08/13/2015   MCV 85.5 08/13/2015   PLT 200 08/13/2015     Recent Labs Lab 08/13/15 1010  NA 140  K 4.1  CL 102  CO2 25  BUN 18  CREATININE 1.56*  CALCIUM 9.7  GLUCOSE 170*   Lab Results  Component Value Date   CHOL 153 03/17/2015   HDL 32.20* 03/17/2015   LDLCALC 98 03/17/2015   TRIG 116.0 03/17/2015   No results found for: DDIMER   Radiology/Studies  Dg Chest 2 View  08/13/2015  CLINICAL DATA:  Chest pain starting this morning. EXAM: CHEST  2 VIEW COMPARISON:  07/08/2015 FINDINGS: Prior CABG.  Upper normal heart size.  Atherosclerotic aortic arch. Thoracic spondylosis. No pleural effusion or airspace opacity. Minimal scarring at the lung bases. IMPRESSION: 1. Prior CABG with upper normal heart size but no edema. 2. Mild bibasilar scarring. 3. Atherosclerotic aortic arch. Electronically Signed   By: Van Clines M.D.   On: 08/13/2015 10:58    ECG  Mild inferolateral ST depressions, new compared to prior EKG in 2016    ASSESSMENT AND PLAN  Principal Problem:   NSTEMI (non-ST elevated myocardial infarction) ([place]) Active Problems:    HLD (hyperlipidemia)   Essential hypertension   Chronic diastolic heart failure ([place])   CKD stage 3 due to type 2 diabetes mellitus ([name])   DM (diabetes mellitus) type II controlled, neurological manifestation ([place])   Coronary artery disease with unspecified angina pectoris   1. NSTEMI: symptomatology, EKG and troponin c/w NSTEMI. He is currently CP free on IV heparin. Will continue to cycle enzymes x 3. Plan for [facility] +/- PCI. Obtain 2D echo. Continue ASA, Coreg, statin and Imdur. Hold losartan and metformin given CKD and plans for cath. Patient notes bleeding complication/ femoral hematoma after last LHC in 2015. He is requesting cath be done radially if possible.   2. AS: mild on prior echo in 2016 with mean gradient of 14 mmHg. Will repeat echo to reassess.   3. HTN: mildly elevated in the ED in the 0000000 systolic. Continue home Coreg, amlodipine and Imdur. Will hold losartan until post cath, given chronic renal insuffiencey.   4. DM: will hold metformin given CKD and need for cath. Use SSI.   5. HLD: on Pravastatin as an OP. Last Lipid panel 03/2015 showed LDL not at goal at 98 mg/dL. Given his history and new ACS, he will need more aggressive medical therapy for LDL reduction to <70 mg/dL. Recommend changing to Lipitor 80 mg. If unable to tolerate more potent statin, will need to consider referral to outpatient lipid  clinic for PCSK9 inhibitor.   6. CKD: SCr is at baseline at 1.5. Will monitor closely post cath. Will hold his losartan and Metformin for now.   7. Chronic Diastolic HF: patient with mild bibasilar rales but no peripheral edema. Continue home lasix. Order 2D echo to reassess LVF.   Signed, Lyda Jester, PA-C 08/13/2015, 12:59 PM   Attending Note:   The patient was seen and examined.  Agree with assessment and plan as noted above.  Changes made to the above note as needed.  1. ACS: Pt presents with symptoms c/w CAD and unstable angina . He has new ST /T wave  changes and has mildly elevted troponin levels  Hx of CAD in the past At this point , he is pain free but he needs to have a cath  We have discussed the risks, benefits, options of cath He understands and agrees to proceed  Thayer Headings, Brooke Bonito., MD, [facility] 08/13/2015, 3:28 PM [address],  [place] Pager [phone]

## 2015-08-14 ENCOUNTER — Inpatient Hospital Stay (HOSPITAL_COMMUNITY): Payer: Medicare Other

## 2015-08-14 ENCOUNTER — Encounter (HOSPITAL_COMMUNITY): Payer: Self-pay | Admitting: Cardiovascular Disease

## 2015-08-14 DIAGNOSIS — I350 Nonrheumatic aortic (valve) stenosis: Secondary | ICD-10-CM

## 2015-08-14 LAB — GLUCOSE, CAPILLARY: Glucose-Capillary: 144 mg/dL — ABNORMAL HIGH (ref 65–99)

## 2015-08-14 LAB — ECHOCARDIOGRAM COMPLETE
Height: 73 in
Weight: 3548.52 oz

## 2015-08-14 LAB — BASIC METABOLIC PANEL
Anion gap: 11 (ref 5–15)
BUN: 18 mg/dL (ref 6–20)
CHLORIDE: 104 mmol/L (ref 101–111)
CO2: 26 mmol/L (ref 22–32)
CREATININE: 1.59 mg/dL — AB (ref 0.61–1.24)
Calcium: 9 mg/dL (ref 8.9–10.3)
GFR calc Af Amer: 45 mL/min — ABNORMAL LOW (ref >60)
GFR, EST NON AFRICAN AMERICAN: 39 mL/min — AB (ref >60)
Glucose, Bld: 165 mg/dL — ABNORMAL HIGH (ref 65–99)
Potassium: 4.4 mmol/L (ref 3.5–5.1)
SODIUM: 141 mmol/L (ref 135–145)

## 2015-08-14 LAB — CBC
HEMATOCRIT: 32.1 % — AB (ref 39.0–52.0)
Hemoglobin: 10.2 g/dL — ABNORMAL LOW (ref 13.0–17.0)
MCH: 27.1 pg (ref 26.0–34.0)
MCHC: 31.8 g/dL (ref 30.0–36.0)
MCV: 85.1 fL (ref 78.0–100.0)
PLATELETS: 173 10*3/uL (ref 150–400)
RBC: 3.77 MIL/uL — AB (ref 4.22–5.81)
RDW: 15.8 % — AB (ref 11.5–15.5)
WBC: 6.7 10*3/uL (ref 4.0–10.5)

## 2015-08-14 LAB — LIPID PANEL
CHOL/HDL RATIO: 5.4 ratio
Cholesterol: 145 mg/dL (ref 0–200)
HDL: 27 mg/dL — AB (ref >40)
LDL CALC: 88 mg/dL (ref 0–99)
Triglycerides: 148 mg/dL (ref <150)
VLDL: 30 mg/dL (ref 0–40)

## 2015-08-14 LAB — TROPONIN I: Troponin I: 0.68 ng/mL (ref <0.031)

## 2015-08-14 MED ORDER — ATORVASTATIN CALCIUM 80 MG PO TABS: 80.0000 mg | ORAL_TABLET | Freq: Every day | ORAL | Status: DC

## 2015-08-14 MED ORDER — CLOPIDOGREL BISULFATE 75 MG PO TABS: 75.0000 mg | ORAL_TABLET | Freq: Every day | ORAL | Status: DC

## 2015-08-14 NOTE — Telephone Encounter (Signed)
Pt had another stent placed yesterday and is now back on Plavix. Procedures have been cancelled. Do you still want pt to have a barium esophagram? Please advise.

## 2015-08-14 NOTE — Progress Notes (Signed)
  Echocardiogram 2D Echocardiogram has been performed.  [name] 08/14/2015, 9:31 AM

## 2015-08-14 NOTE — Progress Notes (Signed)
Hospital Problem List     Principal Problem:   NSTEMI (non-ST elevated myocardial infarction) ([place]) Active Problems:   HLD (hyperlipidemia)   Essential hypertension   Chronic diastolic heart failure ([place])   CKD stage 3 due to type 2 diabetes mellitus ([name])   DM (diabetes mellitus) type II controlled, neurological manifestation ([place])   Coronary artery disease with unspecified angina pectoris     Patient Profile:   Primary Cardiologist: Dr. Johnsie Cancel  82 y/o male with known CAD (s/p CABG in 2001 with multiple PCIs), mild AS, HTN, HLD, DM and CKD (not on HD), who  presented to the ED on 08/13/2015 for CP. Admitted with NSTEMI and peak troponin of 0.68.    Subjective   Reports mild chest discomfort yesterday afternoon but denies any repeat episodes overnight or this morning. Anxious to go home.  Inpatient Medications    . amLODipine  10 mg Oral Daily  . aspirin EC  81 mg Oral QHS  . atorvastatin  80 mg Oral q1800  . azaTHIOprine  100 mg Oral BID  . carvedilol  12.5 mg Oral BID WC  . clopidogrel  75 mg Oral Q breakfast  . furosemide  40 mg Oral Daily  . gabapentin  300 mg Oral BID  . isosorbide mononitrate  30 mg Oral Daily  . sodium chloride flush  3 mL Intravenous Q12H    Vital Signs    Filed Vitals:   08/13/15 1845 08/13/15 1900 08/13/15 2000 08/14/15 0543  BP: 173/73 181/81 147/96 136/62  Pulse: 65 80 78 58  Temp:   97.9 F (36.6 C) 97.7 F (36.5 C)  TempSrc:   Oral Oral  Resp: 25 23 28 20   Height:      Weight:    221 lb 12.5 oz (100.6 kg)  SpO2: 92% 94% 94% 98%    Intake/Output Summary (Last 24 hours) at 08/14/15 0724 Last data filed at 08/14/15 0000  Gross per 24 hour  Intake      0 ml  Output   1000 ml  Net  -1000 ml   Filed Weights   08/13/15 1002 08/13/15 1105 08/14/15 0543  Weight: 225 lb (102.059 kg) 221 lb 12.8 oz (100.608 kg) 221 lb 12.5 oz (100.6 kg)    Physical Exam    General: Well developed, well nourished, male appearing in no acute  distress. Head: Normocephalic, atraumatic.  Neck: Supple without bruits, JVD not elevated. Lungs:  Resp regular and unlabored, CTA without wheezing or rales. Heart: RRR, S1, S2, no S3, S4, 2/6 SEM at RUSB; no rub. Abdomen: Soft, non-tender, non-distended with normoactive bowel sounds. No hepatomegaly. No rebound/guarding. No obvious abdominal masses. Extremities: No clubbing, cyanosis, or edema. Distal pedal pulses are 2+ bilaterally. Right radial cath site stable without evidence of ecchymosis or a hematoma. Neuro: Alert and oriented X 3. Moves all extremities spontaneously. Psych: Normal affect.  Labs    CBC  Recent Labs  08/13/15 1012 08/14/15 0448  WBC 7.6 6.7  HGB 12.1* 10.2*  HCT 37.6* 32.1*  MCV 85.5 85.1  PLT 200 [id number]   Basic Metabolic Panel  Recent Labs  08/13/15 1010 08/14/15 0448  NA 140 141  K 4.1 4.4  CL 102 104  CO2 25 26  GLUCOSE 170* 165*  BUN 18 18  CREATININE 1.56* 1.59*  CALCIUM 9.7 9.0   Liver Function Tests No results for input(s): AST, ALT, ALKPHOS, BILITOT, PROT, ALBUMIN in the last 72 hours. No results for input(s):  LIPASE, AMYLASE in the last 72 hours. Cardiac Enzymes  Recent Labs  08/13/15 1829 08/13/15 2234 08/14/15 0448  TROPONINI 0.50* 0.68* 0.68*   Fasting Lipid Panel  Recent Labs  08/14/15 0445  CHOL 145  HDL 27*  LDLCALC 88  TRIG 148  CHOLHDL 5.4    Telemetry    NSR, HR in 60's - 70's. Multiform PVC's.  ECG    SR, HR 60's w/ PAC's. TWI in inferior and lateral leads.    Cardiac Studies and Radiology    Dg Chest 2 View: 08/13/2015  CLINICAL DATA:  Chest pain starting this morning. EXAM: CHEST  2 VIEW COMPARISON:  07/08/2015 FINDINGS: Prior CABG.  Upper normal heart size.  Atherosclerotic aortic arch. Thoracic spondylosis. No pleural effusion or airspace opacity. Minimal scarring at the lung bases. IMPRESSION: 1. Prior CABG with upper normal heart size but no edema. 2. Mild bibasilar scarring. 3. Atherosclerotic  aortic arch. Electronically Signed   By: Van Clines M.D.   On: 08/13/2015 10:58    Cardiac Catheterization: 08/13/2015   Prox RCA lesion, 100% stenosed.  LIMA .  The LIMA to LAD graft is widely patent without stenosis.  Sequential SVG .  The sequential saphenous vein graft to OM1 and OM 2 has critical stenosis within the previously stented segment in the proximal body of the graft.  SVG .  Graft known to be totally occluded, not selectively injected  Origin lesion, 100% stenosed.  SVG .  The graft is chronically occluded  Prox Graft lesion, 100% stenosed.  Prox LAD lesion, 100% stenosed.  Ost LM lesion, 80% stenosed.  Ost 2nd Mrg to 2nd Mrg lesion, 90% stenosed.  Prox Graft to Mid Graft lesion, before 1st Mrg, 99% stenosed. Post intervention, there is a 0% residual stenosis. The lesion was previously treated with a stent (unknown type).  1. Severe native three-vessel coronary artery disease with severe left main stenosis, total occlusion of the LAD, total occlusion of the RCA, and severe native left circumflex stenosis  2. Status post aortocoronary bypass surgery with continued patency of the LIMA to LAD and continued patency of the saphenous vein graft to OM with severe stenosis in the proximal body of the graft (in-stent)  3. Chronic total occlusion of the saphenous graft RCA and saphenous vein graft to diagonal  4. Successful PCI of the saphenous vein graft OM with placement of a drug-eluting stent(treatment of in-stent restenosis)  Recommendation: dual antiplatelet therapy with aspirin and Plavix at least 12 months. Consider long-term therapy if tolerated in this patient with previous bypass grafting who has required multiple PCI procedures.  Assessment & Plan    1. NSTEMI - troponin peaked at 0.68. Went for a LHC on 08/13/2015 which showed a patent LIMA-LAD and patent SVG-OM with severe stenosis in the proximal graft. PCI with a DES was performed. CTO of  SVG-RCA and SVG-D1 was noted. He will be on DAPT for at least 12 months and possibly indefinitely due to a history of multiple PCI procedures. - continue ASA, BB, statin, Plavix, and Imdur.   2. Mild AS - mild on prior echo in 2016 with mean gradient of 14 mmHg.  - repeat echo is pending.   3. HTN - has been variable in the past 24 hours. Most recent reading improved to 136/62. - continue current medication regimen.  4. DM - resume Metformin 48 hours following cath.  5. HLD - on Pravastatin as an OP. Last Lipid panel 03/2015 showed LDL not at  goal at 98 mg/dL.  - switched to high-intensity statin at time of admission.  6. CKD - SCr is at baseline at 1.5. Stable at 1.59 post-cath.  7. Chronic Diastolic HF - patient with mild bibasilar rales but no peripheral edema.  - Continue home lasix. Repeat 2D echo to reassess LVF is pending.   Appears stable for discharge today. A repeat echocardiogram is pending. I updated this to "Anticipated Discharge". If it cannot be performed this AM, will arrange for it to be done as an outpatient.   Arna Medici , PA-C 7:24 AM 08/14/2015 Pager: [phone]  Attending Note:   The patient was seen and examined.  Agree with assessment and plan as noted above.  Changes made to the above note as needed.  Pt has done well. No CP .  Getting his echo. He will be able to go later today .  I would recommend lifelong plavix   Ramond Dial., MD, [facility] 08/14/2015, 8:46 AM [address],  [place] Pager [phone]

## 2015-08-14 NOTE — Discharge Summary (Signed)
Discharge Summary    Patient ID: [name],  MRN: [id number], DOB/AGE: 1932-01-06 82 y.o.  Admit date: 08/13/2015 Discharge date: 08/14/2015  Primary Care Provider: Viviana Simpler Primary Cardiologist: Dr. Johnsie Cancel  Discharge Diagnoses    Principal Problem:   NSTEMI (non-ST elevated myocardial infarction) [facility]) Active Problems:   HLD (hyperlipidemia)   Essential hypertension   Chronic diastolic heart failure ([place])   CKD stage 3 due to type 2 diabetes mellitus ([place])   DM (diabetes mellitus) type II controlled, neurological manifestation ([place])   Coronary artery disease with unspecified angina pectoris   History of Present Illness     [name] is a 82 y.o. male with past medical history of CAD (s/p CABG in 2001 with multiple PCI's since, known occluded SVG-RCA and SVG-D1), mild AS, HTN, HLD, DM and CKD (not on HD), wh[facility]ed to [name] on 08/13/2015 for evaluation of chest pain.   He reported having mid-epigastric pain occuring off and on for the past week which was occuring with exertion. It was relieved with rest and SL NTG. He has also noticed dyspnea with exertion. While in the ED, his initial troponin was elevated to 0.49 and EKG showed slight inferolateral ST depressions, new compared to prior EKG in 2016. CBC showed chronic renal insuffiencey with SCr at 1.56 which is his baseline.   With his presenting symptoms, EKG changes, and elevated troponin, a cardiac catheterization was recommended. He was admitted and started on Heparin with anticipation of a cardiac cath later that day.  Hospital Course     Consultants: None   His catheterization showed a patent LIMA-LAD and 99% stenosis noted in the SVG-OM. PCI with a DES was performed. CTO of SVG-RCA and SVG-D1 was noted. He will be on DAPT for at least 12 months and possibly indefinitely due to a history of multiple PCI procedures. He tolerated the procedure well and no complications were noted.  The  following morning, he denied any repeat episodes of chest discomfort overnight or the following morning. His right radial cath site was stable without evidence of ecchymosis or a hematoma. Troponin values peaked at 0.68. Creatinine was stable at 1.59. He ambulated over 500 ft with cardiac rehab with mild dyspnea which he has at baseline secondary to COPD. An echocardiogram was performed prior to discharge which showed a preserved EF of 55-60% with hypokinesis of the inferolateral myocardium. Grade 1 DD was noted along with mild AS, mild AI, mild MR and TR.   He was last examined by Dr. Acie Fredrickson and deemed stable for discharge. He will continue on ASA, BB, statin (changed to high-intensity this admission), Plavix, and Imdur. Reports his Plavix was discontinued just one month ago and has a new bottle of the medication at home (Refills were sent to his pharmacy with instructions not to fill at this time). A 10-day TCM appointment was arranged prior to discharge.   _____________  Discharge Vitals Blood pressure 171/74, pulse 78, temperature 97.8 F (36.6 C), temperature source Oral, resp. rate 18, height 6\' 1"  (1.854 m), weight 221 lb 12.5 oz (100.6 kg), SpO2 98 %.  Filed Weights   08/13/15 1002 08/13/15 1105 08/14/15 0543  Weight: 225 lb (102.059 kg) 221 lb 12.8 oz (100.608 kg) 221 lb 12.5 oz (100.6 kg)    Labs & Radiologic Studies     CBC  Recent Labs  08/13/15 1012 08/14/15 0448  WBC 7.6 6.7  HGB 12.1* 10.2*  HCT 37.6* 32.1*  MCV 85.5 85.1  PLT 200 [id number]   Basic Metabolic Panel  Recent Labs  08/13/15 1010 08/14/15 0448  NA 140 141  K 4.1 4.4  CL 102 104  CO2 25 26  GLUCOSE 170* 165*  BUN 18 18  CREATININE 1.56* 1.59*  CALCIUM 9.7 9.0   Cardiac Enzymes  Recent Labs  08/13/15 1829 08/13/15 2234 08/14/15 0448  TROPONINI 0.50* 0.68* 0.68*   Fasting Lipid Panel  Recent Labs  08/14/15 0445  CHOL 145  HDL 27*  LDLCALC 88  TRIG 148  CHOLHDL 5.4    Dg Chest 2 View:  08/13/2015  CLINICAL DATA:  Chest pain starting this morning. EXAM: CHEST  2 VIEW COMPARISON:  07/08/2015 FINDINGS: Prior CABG.  Upper normal heart size.  Atherosclerotic aortic arch. Thoracic spondylosis. No pleural effusion or airspace opacity. Minimal scarring at the lung bases. IMPRESSION: 1. Prior CABG with upper normal heart size but no edema. 2. Mild bibasilar scarring. 3. Atherosclerotic aortic arch. Electronically Signed   By: Van Clines M.D.   On: 08/13/2015 10:58     Diagnostic Studies/Procedures     Cardiac Catheterization: 08/13/2015  Prox RCA lesion, 100% stenosed.  LIMA .  The LIMA to LAD graft is widely patent without stenosis.  Sequential SVG .  The sequential saphenous vein graft to OM1 and OM 2 has critical stenosis within the previously stented segment in the proximal body of the graft.  SVG .  Graft known to be totally occluded, not selectively injected  Origin lesion, 100% stenosed.  SVG .  The graft is chronically occluded  Prox Graft lesion, 100% stenosed.  Prox LAD lesion, 100% stenosed.  Ost LM lesion, 80% stenosed.  Ost 2nd Mrg to 2nd Mrg lesion, 90% stenosed.  Prox Graft to Mid Graft lesion, before 1st Mrg, 99% stenosed. Post intervention, there is a 0% residual stenosis. The lesion was previously treated with a stent (unknown type).  1. Severe native three-vessel coronary artery disease with severe left main stenosis, total occlusion of the LAD, total occlusion of the RCA, and severe native left circumflex stenosis  2. Status post aortocoronary bypass surgery with continued patency of the LIMA to LAD and continued patency of the saphenous vein graft to OM with severe stenosis in the proximal body of the graft (in-stent)  3. Chronic total occlusion of the saphenous graft RCA and saphenous vein graft to diagonal  4. Successful PCI of the saphenous vein graft OM with placement of a drug-eluting stent(treatment of in-stent  restenosis)  Recommendation: dual antiplatelet therapy with aspirin and Plavix at least 12 months. Consider long-term therapy if tolerated in this patient with previous bypass grafting who has required multiple PCI procedures.   Echocardiogram: 08/14/2015 Study Conclusions - Left ventricle: The cavity size was normal. Wall thickness was  increased in a pattern of mild LVH. Systolic function was normal.  The estimated ejection fraction was in the range of 55% to 60%.  There is hypokinesis of the inferolateral myocardium. Doppler  parameters are consistent with abnormal left ventricular  relaxation (grade 1 diastolic dysfunction). Doppler parameters  are consistent with high ventricular filling pressure. - Aortic valve: Valve mobility was restricted. There was mild  stenosis. There was mild regurgitation. Valve area (VTI): 1.87  cm^2. Valve area (Vmax): 2.08 cm^2. Valve area (Vmean): 1.9 cm^2. - Mitral valve: Calcified annulus. Mildly thickened leaflets .  There was mild regurgitation. - Left atrium: The atrium was mildly dilated. - Pulmonary arteries: Systolic pressure was mildly increased.  PA  peak pressure: 36 mm Hg (S).  Impressions: - Hypokinesis of the inferior lateral wall with overall preserved  LV function; grade 1 diastolic dysfunction with elevated LV  filling pressure; mild LAE; calcified aortic valve with mild AS  and mild AI; mild MR and TR; mildly elevated pulmonary pressure.    Disposition   Pt is being discharged home today in good condition.  Follow-up Plans & Appointments    Follow-up Information    Follow up with Truitt Merle, NP On 08/25/2015.   Specialties:  Nurse Practitioner, Interventional Cardiology, Cardiology, Radiology   Why:  [place] on 08/25/2015 at 10:00AM.   Contact information:   [place] [place] [address] [phone]      Discharge Instructions    Amb Referral to Cardiac  Rehabilitation    Complete by:  As directed   Diagnosis:   Coronary Stents NSTEMI       Diet - low sodium heart healthy    Complete by:  As directed      Discharge instructions    Complete by:  As directed   PLEASE REMEMBER TO BRING ALL OF YOUR MEDICATIONS TO EACH OF YOUR FOLLOW-UP OFFICE VISITS.  PLEASE ATTEND ALL SCHEDULED FOLLOW-UP APPOINTMENTS.   Activity: Increase activity slowly as tolerated. You may shower, but no soaking baths (or swimming) for 1 week. No driving for 1 week. No lifting over 10 lbs for 2 weeks. No sexual activity for 2 weeks.   You May Return to Work: in 1 week (if applicable)  Wound Care: You may wash cath site gently with soap and water. Keep cath site clean and dry. If you notice pain, swelling, bleeding or pus at your cath site, please call [phone].     Increase activity slowly    Complete by:  As directed            Discharge Medications   Current Discharge Medication List    START taking these medications   Details  atorvastatin (LIPITOR) 80 MG tablet Take 1 tablet (80 mg total) by mouth daily at 6 PM. Qty: 30 tablet, Refills: 6    clopidogrel (PLAVIX) 75 MG tablet Take 1 tablet (75 mg total) by mouth daily with breakfast. Qty: 30 tablet, Refills: 6      CONTINUE these medications which have NOT CHANGED   Details  amLODipine (NORVASC) 10 MG tablet TAKE 1 TABLET BY MOUTH DAILY Qty: 90 tablet, Refills: 3    aspirin EC 81 MG tablet Take 81 mg by mouth at bedtime.    azaTHIOprine (IMURAN) 50 MG tablet Take 100 mg by mouth 2 (two) times daily.     carvedilol (COREG) 12.5 MG tablet TAKE 1 TABLET BY MOUTH TWICE A DAY WITH MEALS Qty: 60 tablet, Refills: 9    desipramine (NOPRAMIN) 10 MG tablet Take 1 tablet at bedtime for two weeks, then increase to 2 tablets. Qty: 60 tablet, Refills: 5    DHA-Vitamin C-Lutein (EYE HEALTH FORMULA PO) Take 1 tablet by mouth daily.     furosemide (LASIX) 40 MG tablet TAKE ONE OR TWO TABLETS BY MOUTH EVERY  DAY. Qty: 60 tablet, Refills: 11    gabapentin (NEURONTIN) 300 MG capsule TAKE 3 CAPSULES BY MOUTH EVERY MORNING AND TAKE 4 TO 5 CAPSULES BY MOUTH EVERY NIGHT AT BEDTIM Qty: 720 capsule, Refills: 0    isosorbide mononitrate (IMDUR) 30 MG 24 hr tablet TAKE 1 TABLET BY MOUTH DAILY Qty: 30 tablet, Refills: 9  losartan (COZAAR) 100 MG tablet TAKE 1 TABLET BY MOUTH DAILY Qty: 90 tablet, Refills: 3    metFORMIN (GLUCOPHAGE) 1000 MG tablet Take 1 tablet (1,000 mg total) by mouth 2 (two) times daily with a meal. Qty: 60 tablet, Refills: 11    Multiple Vitamin (MULTIVITAMIN WITH MINERALS) TABS tablet Take 1 tablet by mouth daily.    nitroGLYCERIN (NITROSTAT) 0.4 MG SL tablet DISSOLVE 1 TABLET UNDER THE TONGUE FOR CHEST PAIN. MAY REPEAT EVERY 5MINUTES UP TO 3 DOSES. IF NO RELIEF, CALL 911** Qty: 25 tablet, Refills: 5      STOP taking these medications     pravastatin (PRAVACHOL) 40 MG tablet          Aspirin prescribed at discharge?  Yes High Intensity Statin Prescribed? (Lipitor 40-80mg  or Crestor 20-40mg ): Yes Beta Blocker Prescribed? Yes For EF 45% or less, Was ACEI/ARB Prescribed? Yes ADP Receptor Inhibitor Prescribed? (i.e. Plavix etc.-Includes Medically Managed Patients): Yes For EF <40%, Aldosterone Inhibitor Prescribed? No: N/A - EF preserved. Was EF assessed during THIS hospitalization? Yes - Cath and Echo Was Cardiac Rehab II ordered? (Included Medically managed Patients): Yes   Allergies Allergies  Allergen Reactions  . Doxazosin Mesylate Other (See Comments)    dizziness  . Methocarbamol Rash    Outstanding Labs/Studies   None  Duration of Discharge Encounter   Greater than 30 minutes including physician time.  Signed, Erma Heritage, PA-C 08/14/2015, 10:59 AM   Attending Note:   The patient was seen and examined.  Agree with assessment and plan as noted above.  Changes made to the above note as needed.  Pt is stable for DC See progress note from  Aug 14, 2015   Thayer Headings, Brooke Bonito., MD, [facility] 08/15/2015, 9:43 AM [address]  [place] Pager [phone]

## 2015-08-14 NOTE — Discharge Instructions (Addendum)
STOP TAKING THE FOLLOWING MEDICATIONS:  PRAVASTATIN (PRAVACHOL)

## 2015-08-14 NOTE — Progress Notes (Signed)
CARDIAC REHAB PHASE I   PRE:  Rate/Rhythm: 57 SR  BP:  Sitting: 171/74        SaO2: 98 RA  MODE:  Ambulation: 500 ft   POST:  Rate/Rhythm: 74 SR  BP:  Sitting: 185/77         SaO2: 94 RA  Pt ambulated 500 ft on RA, handheld assist, steady gait, tolerated fairly well.  Pt c/o mild DOE (states this is baseline-pt has severe COPD), denies cp, dizziness, declined rest stop. Completed MI/stent education.  Reviewed risk factors, anti-platelet therapy, stent card, activity restrictions, ntg, exercise, heart healthy diet, carb counting, portion control, sodium restrictions and phase 2 cardiac rehab. Pt verbalized understanding, receptive to education. Pt states he was scheduled to have and esophogeal dilation next week, advised pt to discuss with MD. Pt agrees to phase 2 cardiac rehab referral, although he states he has been unable to afford it in the past, will send to [facility] per pt request. Pt to recliner after walk, call bell within reach.   Redbird Smith, RN, BSN 08/14/2015 8:40 AM

## 2015-08-14 NOTE — Telephone Encounter (Signed)
No. He should just he carefully and slowly. No need for office appointment either

## 2015-08-14 NOTE — Telephone Encounter (Signed)
Spoke with pt and he is aware. 

## 2015-08-15 ENCOUNTER — Other Ambulatory Visit: Payer: Self-pay | Admitting: Internal Medicine

## 2015-08-16 DIAGNOSIS — R0689 Other abnormalities of breathing: Secondary | ICD-10-CM | POA: Diagnosis not present

## 2015-08-16 DIAGNOSIS — J8410 Pulmonary fibrosis, unspecified: Secondary | ICD-10-CM | POA: Diagnosis not present

## 2015-08-20 ENCOUNTER — Encounter: Payer: Medicare Other | Admitting: Internal Medicine

## 2015-08-25 ENCOUNTER — Ambulatory Visit (INDEPENDENT_AMBULATORY_CARE_PROVIDER_SITE_OTHER): Payer: Medicare Other | Admitting: Nurse Practitioner

## 2015-08-25 ENCOUNTER — Encounter: Payer: Self-pay | Admitting: Nurse Practitioner

## 2015-08-25 VITALS — BP 126/68 | HR 64 | Ht 73.0 in | Wt 218.6 lb

## 2015-08-25 DIAGNOSIS — I259 Chronic ischemic heart disease, unspecified: Secondary | ICD-10-CM | POA: Diagnosis not present

## 2015-08-25 LAB — BASIC METABOLIC PANEL
BUN: 24 mg/dL (ref 7–25)
CO2: 29 mmol/L (ref 20–31)
Calcium: 9.4 mg/dL (ref 8.6–10.3)
Chloride: 101 mmol/L (ref 98–110)
Creat: 1.89 mg/dL — ABNORMAL HIGH (ref 0.70–1.11)
Glucose, Bld: 188 mg/dL — ABNORMAL HIGH (ref 65–99)
Potassium: 4.9 mmol/L (ref 3.5–5.3)
Sodium: 140 mmol/L (ref 135–146)

## 2015-08-25 LAB — CBC
HCT: 36 % — ABNORMAL LOW (ref 38.5–50.0)
Hemoglobin: 11.8 g/dL — ABNORMAL LOW (ref 13.2–17.1)
MCH: 26.9 pg — ABNORMAL LOW (ref 27.0–33.0)
MCHC: 32.8 g/dL (ref 32.0–36.0)
MCV: 82.2 fL (ref 80.0–100.0)
MPV: 10 fL (ref 7.5–12.5)
Platelets: 243 10*3/uL (ref 140–400)
RBC: 4.38 MIL/uL (ref 4.20–5.80)
RDW: 16.6 % — ABNORMAL HIGH (ref 11.0–15.0)
WBC: 8.4 10*3/uL (ref 3.8–10.8)

## 2015-08-25 MED ORDER — ISOSORBIDE MONONITRATE ER 60 MG PO TB24: 60.0000 mg | ORAL_TABLET | Freq: Every day | ORAL | Status: DC

## 2015-08-25 NOTE — Patient Instructions (Addendum)
We will be checking the following labs today - BMET and CBC   Medication Instructions:    Continue with your current medicines. BUT  I am increasing the Imdur to 60 mg a day - you can take 2 of your 30 mg tablets and use up - the RX for the 60 mg is at your drug store.     Testing/Procedures To Be Arranged:  N/A  Follow-Up:   See [place] back in 2 to 3 weeks.     Other Special Instructions:   Try to gradually resume your activities.     If you need a refill on your cardiac medications before your next appointment, please call your pharmacy.   Call the [place] office at [phone] if you have any questions, problems or concerns.

## 2015-08-25 NOTE — Progress Notes (Signed)
CARDIOLOGY OFFICE NOTE  Date:  08/25/2015    [name] Date of Birth: [birth year] Medical Record [id number]  PCP:  Viviana Simpler, MD  Cardiologist:  Johnsie Cancel    Chief Complaint  Patient presents with  . Coronary Artery Disease    Post hospital visit - seen for Dr. Johnsie Cancel    History of Present Illness: [name] is a 82 y.o. male who presents today for a work in visit. Seen for Dr. Johnsie Cancel. This was to be a TOC but no phone call documented.   He has a past medical history of CAD (s/p CABG in 2001 with multiple PCI's since, known occluded SVG-RCA and SVG-D1), mild AS, HTN, HLD, DM and CKD (not on HD).   He was last seen here in the office at the end of March.   He presented to [name] ED on 08/13/2015 for evaluation of chest pain.   He reported having mid-epigastric pain occuring off and on for the past week which was occuring with exertion. It was relieved with rest and SL NTG. He has also noticed dyspnea with exertion. While in the ED, his initial troponin was elevated to 0.49 and EKG showed slight inferolateral ST depressions, new compared to prior EKG in 2016. CBC showed chronic renal insuffiencey with SCr at 1.56 which is his baseline.   With his presenting symptoms, EKG changes, and elevated troponin, a cardiac catheterization was recommended. He was admitted and started on Heparin with anticipation of a cardiac cath later that day.   His catheterization showed a patent LIMA-LAD and 99% stenosis noted in the SVG-OM. PCI with a DES was performed. CTO of SVG-RCA and SVG-D1 was noted. He will be on DAPT for at least 12 months and possibly indefinitely due to a history of multiple PCI procedures. He tolerated the procedure well and no complications were noted.  An echocardiogram was performed prior to discharge which showed a preserved EF of 55-60% with hypokinesis of the inferolateral myocardium. Grade 1 DD was noted along with mild AS, mild AI, mild MR and TR.    He was last examined by Dr. Acie Fredrickson and deemed stable for discharge. He will continue on ASA, BB, statin (changed to high-intensity this admission), Plavix, and Imdur. Reports his Plavix was discontinued just one month ago and has a new bottle of the medication at home (Refills were sent to his pharmacy with instructions not to fill at this time). A 10-day TCM appointment was arranged prior to discharge.      Comes back today. Here with his wife. Notes that he has been home about 2 weeks. Still with a little hurting in his belly/chest - not as bad as what led him to the ER. Not doing much and has not resumed all his activities. Notes that overall he is about 75% better than he was prior to admission. He always has shortness of breath - this is not new. Legs sore today - he did have a fall yesterday - toe got caught in his oxygen tubing but says he had a "soft fall" since he had a quilt in his hand. No headache, no loss of consciousness. No nausea.   Past Medical History  Diagnosis Date  . Hyperlipidemia   . Hypertension   . GERD (gastroesophageal reflux disease)   . Overweight (BMI 25.0-29.9)     BMI 29  . Interstitial lung disease ([name])     NOS  . Diverticulosis   . Esophageal stricture  a. s/p dilation spring 2010  . CAD (coronary artery disease)     a. s/p CABG (2001)  b. s/p DES to RCA and cutting POBA to ostial PDA (2013)   c. s/p DES to SVG to OM2 (01/14/14) d. cath: 08/2015 NSTEMI w/ patent LIMA-LAD and 99% stenosis of SVG-OM w/ DES placed. CTO of SVG-RCA and SVG-D1.   . History of PFTs     mixed pattern on spiro. mild restn on lung volumes with near normal DLCO. Pattern can be explained by CABG scar. Fev1 2.2L/73%, ratio 68 (67), TLC 4.7/68%,RV 1.5L/55%,DLCO 79%  . Colon polyps   . Chronic kidney disease, stage III (moderate)   . Chronic diastolic CHF (congestive heart failure) ([place])     a) 09/13 ECHO- LVEF 99991111, grade 1 diastolic dysfunction, mild LA dilatation, atrial septal  aneurysm, AV mobility restricted, but no sig AS by doppler; b) 09/04/08 ECHO- LVH, ef 60%, mild AS, c. echo 08/2015: EF perserved of 55-60% with inferolateral HK. Mild AS noted.  [name] Heart murmur   . Chronic lower back pain   . Dyspnea 2009 since July -Sept    05/06/08-[facility]-  normal effort, reduced VO2 max 20.5 /65%, reduced at 8.2/ 40%, normal breathing resetvca of 55%, submaximal heart rate response 112/77%, flattened o2 pluse response at peak exercise-12 ml/beat @ 85%, No VQ mismatch abnormalities, All c/w CIRC Limitation  . Hiatal hernia   . Type II diabetes mellitus ([place])   . Arthritis     osteoarthritis, s/p R TKR, and digits  . RA (rheumatoid arthritis) ([name])     [name]  . Seropositive rheumatoid arthritis ([place])   . Enlarged prostate     Past Surgical History  Procedure Laterality Date  . Total knee arthroplasty Right 03/2010    [name]  . Knee arthroscopy Right 2008  . Cataract extraction w/ intraocular lens  implant, bilateral Bilateral   . Coronary artery bypass graft  11/1999    CABG X5  . Coronary stent placement  02/2012    1 stent and balloon  . Shoulder arthroscopy with open rotator cuff repair and distal clavicle acrominectomy Left 02/27/2013    Procedure: LEFT SHOULDER ARTHROSCOPY WITH MINI OPEN ROTATOR CUFF REPAIR AND SUBACROMIAL DECOMPRESSION AND DISTAL CLAVICLE RESECTION;  Surgeon: [name], MD;  Location: [facility];  Service: Orthopedics;  Laterality: Left;  . Trigger finger release Left 02/27/2013    Procedure: RELEASE TRIGGER FINGER/A-1 PULLEY;  Surgeon: [name], MD;  Location: [facility];  Service: Orthopedics;  Laterality: Left;  . Cholecystectomy open  11/2003    [name]  . Joint replacement    . Esophagogastroduodenoscopy (egd) with esophageal dilation  2010  . Cardiac catheterization  08/2004    CP- no MI, Cath- small vessell disease   . Cardiac catheterization  12/31/2011    80% distal LM, 100% native LAD, LCx and RCA, 30% prox SVG-OM, SVG-D1  normal, 99% distal, 80% ostial SVG-RCA distal to graft, LIMA-LAD normal; LVEF mildly decreased with posterior basal AK   . Coronary angioplasty with stent placement  01/03/2012    Successful DES to SVG-RCA and cutting balloon angioplasty ostial  PDA   . Coronary angioplasty with stent placement  01/14/2014    "1"  . Cardiac catheterization  2009    with patent grafts/notes 12/31/2011  . Left and right heart catheterization with coronary angiogram  12/31/2011    Procedure: LEFT AND RIGHT HEART CATHETERIZATION WITH CORONARY ANGIOGRAM;  Surgeon: Burnell Blanks, MD;  Location: [facility] CATH LAB;  Service: Cardiovascular;;  . Percutaneous coronary stent intervention (pci-s)  12/31/2011    Procedure: PERCUTANEOUS CORONARY STENT INTERVENTION (PCI-S);  Surgeon: Burnell Blanks, MD;  Location: [facility] CATH LAB;  Service: Cardiovascular;;  . Percutaneous coronary stent intervention (pci-s) N/A 01/03/2012    Procedure: PERCUTANEOUS CORONARY STENT INTERVENTION (PCI-S);  Surgeon: Peter M Martinique, MD;  Location: [facility] CATH LAB;  Service: Cardiovascular;  Laterality: N/A;  . Percutaneous coronary intervention-balloon only  01/03/2012    Procedure: PERCUTANEOUS CORONARY INTERVENTION-BALLOON ONLY;  Surgeon: Peter M Martinique, MD;  Location: [facility] CATH LAB;  Service: Cardiovascular;;  . Left and right heart catheterization with coronary angiogram N/A 01/14/2014    Procedure: LEFT AND RIGHT HEART CATHETERIZATION WITH CORONARY ANGIOGRAM;  Surgeon: Peter M Martinique, MD;  Location: [facility] CATH LAB;  Service: Cardiovascular;  Laterality: N/A;  . Hand surgery    . Cardiac catheterization N/A 08/13/2015    Procedure: Left Heart Cath and Cors/Grafts Angiography;  Surgeon: Sherren Mocha, MD;  Location: [facility] CV LAB;  Service: Cardiovascular;  Laterality: N/A;     Medications: Current Outpatient Prescriptions  Medication Sig Dispense Refill  . amLODipine (NORVASC) 10 MG tablet TAKE 1 TABLET BY MOUTH DAILY 90 tablet 3  . aspirin  EC 81 MG tablet Take 81 mg by mouth at bedtime.    [name] atorvastatin (LIPITOR) 80 MG tablet Take 1 tablet (80 mg total) by mouth daily at 6 PM. 30 tablet 6  . azaTHIOprine (IMURAN) 50 MG tablet Take 100 mg by mouth 2 (two) times daily.     . carvedilol (COREG) 12.5 MG tablet TAKE 1 TABLET BY MOUTH TWICE A DAY WITH MEALS 60 tablet 9  . clopidogrel (PLAVIX) 75 MG tablet Take 1 tablet (75 mg total) by mouth daily with breakfast. 30 tablet 6  . desipramine (NOPRAMIN) 10 MG tablet Take 1 tablet at bedtime for two weeks, then increase to 2 tablets. 60 tablet 5  . DHA-Vitamin C-Lutein (EYE HEALTH FORMULA PO) Take 1 tablet by mouth daily.     . furosemide (LASIX) 40 MG tablet TAKE ONE OR TWO TABLETS BY MOUTH EVERY DAY. 60 tablet 11  . gabapentin (NEURONTIN) 300 MG capsule TAKE 3 CAPSULES BY MOUTH EVERY MORNING AND TAKE 4 TO 5 CAPSULES BY MOUTH EVERY NIGHT AT BEDTIM 720 capsule 0  . losartan (COZAAR) 100 MG tablet TAKE 1 TABLET BY MOUTH DAILY 90 tablet 3  . metFORMIN (GLUCOPHAGE) 1000 MG tablet Take 1 tablet (1,000 mg total) by mouth 2 (two) times daily with a meal. 60 tablet 11  . Multiple Vitamin (MULTIVITAMIN WITH MINERALS) TABS tablet Take 1 tablet by mouth daily.    . nitroGLYCERIN (NITROSTAT) 0.4 MG SL tablet DISSOLVE 1 TABLET UNDER THE TONGUE FOR CHEST PAIN. MAY REPEAT EVERY 5MINUTES UP TO 3 DOSES. IF NO RELIEF, CALL 911** 25 tablet 5  . isosorbide mononitrate (IMDUR) 60 MG 24 hr tablet Take 1 tablet (60 mg total) by mouth daily. 90 tablet 3   No current facility-administered medications for this visit.    Allergies: Allergies  Allergen Reactions  . Doxazosin Mesylate Other (See Comments)    dizziness  . Methocarbamol Rash    Social History: The patient  reports that he quit smoking about 52 years ago. His smoking use included Cigarettes. He has a 20 pack-year smoking history. He has never used smokeless tobacco. He reports that he does not drink alcohol or use illicit drugs.   Family  History: The patient's family  history includes Alcohol abuse in his sister; COPD in his mother; Colon cancer (age of onset: 58) in his brother; Diabetes in his brother; Heart attack in his father; Heart disease in his father; Stroke in his sister.   Review of Systems: Please see the history of present illness.   Otherwise, the review of systems is positive for .   All other systems are reviewed and negative.   Physical Exam: VS:  BP 126/68 mmHg  Pulse 64  Ht 6\' 1"  (1.854 m)  Wt 218 lb 9.6 oz (99.156 kg)  BMI 28.85 kg/m2 .  BMI Body mass index is 28.85 kg/(m^2).  Wt Readings from Last 3 Encounters:  08/25/15 218 lb 9.6 oz (99.156 kg)  08/14/15 221 lb 12.5 oz (100.6 kg)  07/31/15 221 lb (100.245 kg)    General: Pleasant. Elderly male who is alert and in no acute distress. Talks slowly. Superficial abrasion over the right eyebrow.  HEENT: Normal. Neck: Supple, no JVD, carotid bruits, or masses noted.  Cardiac: Regular rate and rhythm. Soft outflow murmur. No edema.  Respiratory:  Lungs with few crackles in the bases but with normal work of breathing.  GI: Soft and nontender.  MS: No deformity or atrophy. Gait and ROM intact. Skin: Warm and dry. Color is normal.  Neuro:  Strength and sensation are intact and no gross focal deficits noted.  Psych: Alert, appropriate and with normal affect.   LABORATORY DATA:  EKG:  EKG is ordered today. This demonstrates NSR with inferolateral T wave changes.  Lab Results  Component Value Date   WBC 6.7 08/14/2015   HGB 10.2* 08/14/2015   HCT 32.1* 08/14/2015   PLT 173 08/14/2015   GLUCOSE 165* 08/14/2015   CHOL 145 08/14/2015   TRIG 148 08/14/2015   HDL 27* 08/14/2015   LDLCALC 88 08/14/2015   ALT 14 03/17/2015   AST 20 03/17/2015   NA 141 08/14/2015   K 4.4 08/14/2015   CL 104 08/14/2015   CREATININE 1.59* 08/14/2015   BUN 18 08/14/2015   CO2 26 08/14/2015   TSH 2.51 08/17/2011   PSA 1.83 02/17/2009   INR 1.12 08/13/2015   HGBA1C  7.4* 03/17/2015   MICROALBUR 0.5 02/04/2009    BNP (last 3 results)  Recent Labs  08/13/15 1829  BNP 318.4*    ProBNP (last 3 results) No results for input(s): PROBNP in the last 8760 hours.   Other Studies Reviewed Today:  Echo Study Conclusions from 08/2015  - Left ventricle: The cavity size was normal. Wall thickness was  increased in a pattern of mild LVH. Systolic function was normal.  The estimated ejection fraction was in the range of 55% to 60%.  There is hypokinesis of the inferolateral myocardium. Doppler  parameters are consistent with abnormal left ventricular  relaxation (grade 1 diastolic dysfunction). Doppler parameters  are consistent with high ventricular filling pressure. - Aortic valve: Valve mobility was restricted. There was mild  stenosis. There was mild regurgitation. Valve area (VTI): 1.87  cm^2. Valve area (Vmax): 2.08 cm^2. Valve area (Vmean): 1.9 cm^2. - Mitral valve: Calcified annulus. Mildly thickened leaflets .  There was mild regurgitation. - Left atrium: The atrium was mildly dilated. - Pulmonary arteries: Systolic pressure was mildly increased. PA  peak pressure: 36 mm Hg (S).  Impressions:  - Hypokinesis of the inferior lateral wall with overall preserved  LV function; grade 1 diastolic dysfunction with elevated LV  filling pressure; mild LAE; calcified aortic valve with mild AS  and mild AI; mild MR and TR; mildly elevated pulmonary pressure.  Procedures    Left Heart Cath and Cors/Grafts Angiography    Conclusion     Prox RCA lesion, 100% stenosed.  LIMA .  The LIMA to LAD graft is widely patent without stenosis.  Sequential SVG .  The sequential saphenous vein graft to OM1 and OM 2 has critical stenosis within the previously stented segment in the proximal body of the graft.  SVG .  Graft known to be totally occluded, not selectively injected  Origin lesion, 100% stenosed.  SVG .  The graft is  chronically occluded  Prox Graft lesion, 100% stenosed.  Prox LAD lesion, 100% stenosed.  Ost LM lesion, 80% stenosed.  Ost 2nd Mrg to 2nd Mrg lesion, 90% stenosed.  Prox Graft to Mid Graft lesion, before 1st Mrg, 99% stenosed. Post intervention, there is a 0% residual stenosis. The lesion was previously treated with a stent (unknown type).  1. Severe native three-vessel coronary artery disease with severe left main stenosis, total occlusion of the LAD, total occlusion of the RCA, and severe native left circumflex stenosis  2. Status post aortocoronary bypass surgery with continued patency of the LIMA to LAD and continued patency of the saphenous vein graft to OM with severe stenosis in the proximal body of the graft (in-stent)  3. Chronic total occlusion of the saphenous graft RCA and saphenous vein graft to diagonal  4. Successful PCI of the saphenous vein graft OM with placement of a drug-eluting stent(treatment of in-stent restenosis)  Recommendation: dual antiplatelet therapy with aspirin and Plavix at least 12 months. Consider long-term therapy if tolerated in this patient with previous bypass grafting who has required multiple PCI procedures.         Assessment/Plan: 1. Extensive CAD with recent PCI - committed to DAPT - symptoms are about 75% improved - will increase his nitrate today. [facility] lab today.   2. HTN - BP ok on current regimen  3. HLD - now on high dose statin - would plan on repeating labs in about 3 months  4. Chronic dyspnea - using Oxygen at night  5. Recent fall - advised on what to be on the lookout for - headache, nausea, sleepiness, etc.   6. Mild AS - would follow.   7. Anemia - rechecking labs today.   Current medicines are reviewed with the patient today.  The patient does not have concerns regarding medicines other than what has been noted above.  The following changes have been made:  See above.  Labs/ tests ordered today include:     Orders Placed This Encounter  Procedures  . Basic metabolic panel  . CBC  . EKG 12-Lead     Disposition:   FU with provider in about 3 weeks.  Patient is agreeable to this plan and will call if any problems develop in the interim.   Signed: Burtis Junes, RN, ANP-C 08/25/2015 10:26 AM  [facility] [address] [place], [place]  [address] Phone: [phone] Fax: [phone]

## 2015-08-26 ENCOUNTER — Telehealth: Payer: Self-pay | Admitting: Cardiovascular Disease

## 2015-08-26 NOTE — Telephone Encounter (Signed)
Follow Up   Pt returned call as a follow up on Lab results.

## 2015-09-09 DIAGNOSIS — Z79899 Other long term (current) drug therapy: Secondary | ICD-10-CM | POA: Diagnosis not present

## 2015-09-11 DIAGNOSIS — M170 Bilateral primary osteoarthritis of knee: Secondary | ICD-10-CM | POA: Diagnosis not present

## 2015-09-11 DIAGNOSIS — M0579 Rheumatoid arthritis with rheumatoid factor of multiple sites without organ or systems involvement: Secondary | ICD-10-CM | POA: Diagnosis not present

## 2015-09-11 DIAGNOSIS — M19241 Secondary osteoarthritis, right hand: Secondary | ICD-10-CM | POA: Diagnosis not present

## 2015-09-11 DIAGNOSIS — J849 Interstitial pulmonary disease, unspecified: Secondary | ICD-10-CM | POA: Diagnosis not present

## 2015-09-15 DIAGNOSIS — M1711 Unilateral primary osteoarthritis, right knee: Secondary | ICD-10-CM | POA: Diagnosis not present

## 2015-09-15 DIAGNOSIS — M1712 Unilateral primary osteoarthritis, left knee: Secondary | ICD-10-CM | POA: Diagnosis not present

## 2015-09-16 DIAGNOSIS — R0689 Other abnormalities of breathing: Secondary | ICD-10-CM | POA: Diagnosis not present

## 2015-09-16 DIAGNOSIS — J8410 Pulmonary fibrosis, unspecified: Secondary | ICD-10-CM | POA: Diagnosis not present

## 2015-09-17 ENCOUNTER — Telehealth: Payer: Self-pay | Admitting: *Deleted

## 2015-09-17 ENCOUNTER — Encounter: Payer: Self-pay | Admitting: Nurse Practitioner

## 2015-09-17 ENCOUNTER — Telehealth: Payer: Self-pay | Admitting: Nurse Practitioner

## 2015-09-17 ENCOUNTER — Ambulatory Visit (INDEPENDENT_AMBULATORY_CARE_PROVIDER_SITE_OTHER): Payer: Medicare Other | Admitting: Nurse Practitioner

## 2015-09-17 VITALS — BP 150/78 | HR 72 | Ht 73.0 in | Wt 219.4 lb

## 2015-09-17 DIAGNOSIS — I10 Essential (primary) hypertension: Secondary | ICD-10-CM

## 2015-09-17 DIAGNOSIS — I259 Chronic ischemic heart disease, unspecified: Secondary | ICD-10-CM | POA: Diagnosis not present

## 2015-09-17 DIAGNOSIS — E785 Hyperlipidemia, unspecified: Secondary | ICD-10-CM | POA: Diagnosis not present

## 2015-09-17 DIAGNOSIS — R0789 Other chest pain: Secondary | ICD-10-CM | POA: Diagnosis not present

## 2015-09-17 NOTE — Telephone Encounter (Signed)
S/w [facility]  @ [phone] will fax over pt June 7 labs

## 2015-09-17 NOTE — Telephone Encounter (Signed)
Follow up  Pt states that he missed a call

## 2015-09-17 NOTE — Patient Instructions (Addendum)
We will be checking the following labs today - BMET and CBC  Medication Instructions:    Continue with your current medicines.     Testing/Procedures To Be Arranged:  N/A  Follow-Up:   See Dr. Johnsie Cancel as planned in September    Other Special Instructions:   N/A    If you need a refill on your cardiac medications before your next appointment, please call your pharmacy.   Call the [place] office at [phone] if you have any questions, problems or concerns.

## 2015-09-17 NOTE — Progress Notes (Signed)
CARDIOLOGY OFFICE NOTE  Date:  09/17/2015    [name] Date of Birth: 01/11/1932 Medical Record [id number]  PCP:  [name] Simpler, MD  Cardiologist:  [name]  Chief Complaint  Patient presents with  . Chest Pain  . Coronary Artery Disease    Follow up visit - seen for Dr. Johnsie [name]    History of Present Illness: [name] is a 82 y.o. male who presents today for a one month check.   Seen for Dr. Johnsie [name].   He has a past medical history of CAD (s/p CABG in 2001 with multiple PCI's since, known occluded SVG-RCA and SVG-D1), mild AS, HTN, HLD, DM and CKD (not on HD).   He was last seen here in the office at the end of March.   He presented to [name] on 08/13/2015 for evaluation of chest pain.   He reported having mid-epigastric pain occuring off and on for the past week which was occuring with exertion. It was relieved with rest and SL NTG. He has also noticed dyspnea with exertion. While in the ED, his initial troponin was elevated to 0.49 and EKG showed slight inferolateral ST depressions, new compared to prior EKG in 2016. CBC showed chronic renal insuffiencey with SCr at 1.56 which is his baseline.   With his presenting symptoms, EKG changes, and elevated troponin, a cardiac catheterization was recommended. He was admitted and started on Heparin with anticipation of a cardiac cath later that day.   His catheterization showed a patent LIMA-LAD and 99% stenosis noted in the SVG-OM. PCI with a DES was performed. CTO of SVG-RCA and SVG-D1 was noted. He will be on DAPT for at least 12 months and possibly indefinitely due to a history of multiple PCI procedures. He tolerated the procedure well and no complications were noted.  An echocardiogram was performed prior to discharge which showed a preserved EF of 55-60% with hypokinesis of the inferolateral myocardium. Grade 1 DD was noted along with mild AS, mild AI, mild MR and TR.   He was last examined by Dr.  Acie [name] and deemed stable for discharge. He will continue on ASA, BB, statin (changed to high-intensity this admission), Plavix, and Imdur. Reports his Plavix was discontinued just one month ago and has a new bottle of the medication at home (Refills were sent to his pharmacy with instructions not to fill at this time). A 10-day TCM appointment was arranged prior to discharge.     I saw him last month in the [facility] and he was still having some symptoms but improved. We increased his Imdur.        Comes back today. Here alone today.  He says he is feeling better. He did have some headache - but this has improved. Not dizzy or lightheaded. No chest pain/belly pain. Breathing is stable. He is pleased with how he is doing.   Past Medical History  Diagnosis Date  . Hyperlipidemia   . Hypertension   . GERD (gastroesophageal reflux disease)   . Overweight (BMI 25.0-29.9)     BMI 29  . Interstitial lung disease ([name])     NOS  . Diverticulosis   . Esophageal stricture     a. s/p dilation spring 2010  . CAD (coronary artery disease)     a. s/p CABG (2001)  b. s/p DES to RCA and cutting POBA to ostial PDA (2013)   c. s/p DES to SVG to OM2 (01/14/14) d. cath: 08/2015  NSTEMI w/ patent LIMA-LAD and 99% stenosis of SVG-OM w/ DES placed. CTO of SVG-RCA and SVG-D1.   . History of PFTs     mixed pattern on spiro. mild restn on lung volumes with near normal DLCO. Pattern can be explained by CABG scar. Fev1 2.2L/73%, ratio 68 (67), TLC 4.7/68%,RV 1.5L/55%,DLCO 79%  . Colon polyps   . Chronic kidney disease, stage III (moderate)   . Chronic diastolic CHF (congestive heart failure) ([name])     a) 09/13 ECHO- LVEF 99991111, grade 1 diastolic dysfunction, mild LA dilatation, atrial septal aneurysm, AV mobility restricted, but no sig AS by doppler; b) 09/04/08 ECHO- LVH, ef 60%, mild AS, c. echo 08/2015: EF perserved of 55-60% with inferolateral HK. Mild AS noted.  [name] Heart murmur   . Chronic lower back pain   .  Dyspnea 2009 since July -Sept    05/06/08-[facility]-  normal effort, reduced VO2 max 20.5 /65%, reduced at 8.2/ 40%, normal breathing resetvca of 55%, submaximal heart rate response 112/77%, flattened o2 pluse response at peak exercise-12 ml/beat @ 85%, No VQ mismatch abnormalities, All c/w CIRC Limitation  . Hiatal hernia   . Type II diabetes mellitus ([name])   . Arthritis     osteoarthritis, s/p R TKR, and digits  . RA (rheumatoid arthritis) ([name])     Dr [name]  . Seropositive rheumatoid arthritis ([name])   . Enlarged prostate     Past Surgical History  Procedure Laterality Date  . Total knee arthroplasty Right 03/2010    Dr Tommie [name]  . Knee arthroscopy Right 2008  . Cataract extraction w/ intraocular lens  implant, bilateral Bilateral   . Coronary artery bypass graft  11/1999    CABG X5  . Coronary stent placement  02/2012    1 stent and balloon  . Shoulder arthroscopy with open rotator cuff repair and distal clavicle acrominectomy Left 02/27/2013    Procedure: LEFT SHOULDER ARTHROSCOPY WITH MINI OPEN ROTATOR CUFF REPAIR AND SUBACROMIAL DECOMPRESSION AND DISTAL CLAVICLE RESECTION;  Surgeon: [name] Balding, MD;  Location: [facility];  Service: Orthopedics;  Laterality: Left;  . Trigger finger release Left 02/27/2013    Procedure: RELEASE TRIGGER FINGER/A-1 PULLEY;  Surgeon: [name] Balding, MD;  Location: [facility];  Service: Orthopedics;  Laterality: Left;  . Cholecystectomy open  11/2003    [name]  . Joint replacement    . Esophagogastroduodenoscopy (egd) with esophageal dilation  2010  . Cardiac catheterization  08/2004    CP- no MI, Cath- small vessell disease   . Cardiac catheterization  12/31/2011    80% distal LM, 100% native LAD, LCx and RCA, 30% prox SVG-OM, SVG-D1 normal, 99% distal, 80% ostial SVG-RCA distal to graft, LIMA-LAD normal; LVEF mildly decreased with posterior basal AK   . Coronary angioplasty with stent placement  01/03/2012    Successful DES to SVG-RCA and cutting balloon  angioplasty ostial  PDA   . Coronary angioplasty with stent placement  01/14/2014    "1"  . Cardiac catheterization  2009    with patent grafts/notes 12/31/2011  . Left and right heart catheterization with coronary angiogram  12/31/2011    Procedure: LEFT AND RIGHT HEART CATHETERIZATION WITH CORONARY ANGIOGRAM;  Surgeon: Burnell Blanks, MD;  Location: [facility] CATH LAB;  Service: Cardiovascular;;  . Percutaneous coronary stent intervention (pci-s)  12/31/2011    Procedure: PERCUTANEOUS CORONARY STENT INTERVENTION (PCI-S);  Surgeon: Burnell Blanks, MD;  Location[facility] C[facility] LAB;  Service: Cardiovascular;;  . Percutaneous coronary stent  intervention (pci-s) N/A 01/03/2012    Procedure: PERCUTANEOUS CORONARY STENT INTERVENTION (PCI-S);  Surgeon: Peter M Martinique, MD;  Location: [facility] CATH LAB;  Service: Cardiovascular;  Laterality: N/A;  . Percutaneous coronary intervention-balloon only  01/03/2012    Procedure: PERCUTANEOUS CORONARY INTERVENTION-BALLOON ONLY;  Surgeon: Peter M Martinique, MD;  Location: [facility] CATH LAB;  Service: Cardiovascular;;  . Left and right heart catheterization with coronary angiogram N/A 01/14/2014    Procedure: LEFT AND RIGHT HEART CATHETERIZATION WITH CORONARY ANGIOGRAM;  Surgeon: Peter M Martinique, MD;  Location: [facility] CATH LAB;  Service: Cardiovascular;  Laterality: N/A;  . Hand surgery    . Cardiac catheterization N/A 08/13/2015    Procedure: Left Heart Cath and Cors/Grafts Angiography;  Surgeon: Sherren Mocha, MD;  Location: [place] CV LAB;  Service: Cardiovascular;  Laterality: N/A;     Medications: Current Outpatient Prescriptions  Medication Sig Dispense Refill  . amLODipine (NORVASC) 10 MG tablet TAKE 1 TABLET BY MOUTH DAILY 90 tablet 3  . aspirin EC 81 MG tablet Take 81 mg by mouth at bedtime.    [name] atorvastatin (LIPITOR) 80 MG tablet Take 1 tablet (80 mg total) by mouth daily at 6 PM. 30 tablet 6  . azaTHIOprine (IMURAN) 50 MG tablet Take 100 mg by mouth 2 (two) times  daily.     . carvedilol (COREG) 12.5 MG tablet TAKE 1 TABLET BY MOUTH TWICE A DAY WITH MEALS 60 tablet 9  . clopidogrel (PLAVIX) 75 MG tablet Take 1 tablet (75 mg total) by mouth daily with breakfast. 30 tablet 6  . desipramine (NOPRAMIN) 10 MG tablet Take 1 tablet at bedtime for two weeks, then increase to 2 tablets. 60 tablet 5  . DHA-Vitamin C-Lutein (EYE HEALTH FORMULA PO) Take 1 tablet by mouth daily.     . furosemide (LASIX) 40 MG tablet TAKE ONE OR TWO TABLETS BY MOUTH EVERY DAY. 60 tablet 11  . gabapentin (NEURONTIN) 300 MG capsule TAKE 3 CAPSULES BY MOUTH EVERY MORNING AND TAKE 4 TO 5 CAPSULES BY MOUTH EVERY NIGHT AT BEDTIM 720 capsule 0  . isosorbide mononitrate (IMDUR) 60 MG 24 hr tablet Take 1 tablet (60 mg total) by mouth daily. 90 tablet 3  . losartan (COZAAR) 100 MG tablet TAKE 1 TABLET BY MOUTH DAILY 90 tablet 3  . metFORMIN (GLUCOPHAGE) 1000 MG tablet Take 1 tablet (1,000 mg total) by mouth 2 (two) times daily with a meal. 60 tablet 11  . Multiple Vitamin (MULTIVITAMIN WITH MINERALS) TABS tablet Take 1 tablet by mouth daily.    . nitroGLYCERIN (NITROSTAT) 0.4 MG SL tablet DISSOLVE 1 TABLET UNDER THE TONGUE FOR CHEST PAIN. MAY REPEAT EVERY 5MINUTES UP TO 3 DOSES. IF NO RELIEF, CALL 911** 25 tablet 5   No current facility-administered medications for this visit.    Allergies: Allergies  Allergen Reactions  . Doxazosin Mesylate Other (See Comments)    dizziness  . Methocarbamol Rash    Social History: The patient  reports that he quit smoking about 52 years ago. His smoking use included Cigarettes. He has a 20 pack-year smoking history. He has never used smokeless tobacco. He reports that he does not drink alcohol or use illicit drugs.   Family History: The patient's family history includes Alcohol abuse in his sister; COPD in his mother; Colon cancer (age of onset: 28) in his brother; Diabetes in his brother; Heart attack in his father; Heart disease in his father; Stroke  in his sister.   Review of Systems: Please  see the history of present illness.   Otherwise, the review of systems is positive for none.   All other systems are reviewed and negative.   Physical Exam: VS:  BP 150/78 mmHg  Pulse 72  Ht 6\' 1"  (1.854 m)  Wt 219 lb 6.4 oz (99.519 kg)  BMI 28.95 kg/m2 .  BMI Body mass index is 28.95 kg/(m^2).  Wt Readings from Last 3 Encounters:  09/17/15 219 lb 6.4 oz (99.519 kg)  08/25/15 218 lb 9.6 oz (99.156 kg)  08/14/15 221 lb 12.5 oz (100.6 kg)   BP by me is 138/70  General: Pleasant. Elderly male who is hard of hearing but alert and in no acute distress.  HEENT: Normal. Neck: Supple, no JVD, carotid bruits, or masses noted.  Cardiac: Regular rate and rhythm. Harsh outflow murmur. No edema.  Respiratory:  Lungs are clear to auscultation bilaterally with normal work of breathing.  GI: Soft and nontender.  MS: No deformity or atrophy. Gait and ROM intact. Skin: Warm and dry. Color is normal.  Neuro:  Strength and sensation are intact and no gross focal deficits noted.  Psych: Alert, appropriate and with normal affect.   LABORATORY DATA:  EKG:  EKG is not ordered today.  Lab Results  Component Value Date   WBC 8.4 08/25/2015   HGB 11.8* 08/25/2015   HCT 36.0* 08/25/2015   PLT 243 08/25/2015   GLUCOSE 188* 08/25/2015   CHOL 145 08/14/2015   TRIG 148 08/14/2015   HDL 27* 08/14/2015   LDLCALC 88 08/14/2015   ALT 14 03/17/2015   AST 20 03/17/2015   NA 140 08/25/2015   K 4.9 08/25/2015   CL 101 08/25/2015   CREATININE 1.89* 08/25/2015   BUN 24 08/25/2015   CO2 29 08/25/2015   TSH 2.51 08/17/2011   PSA 1.83 02/17/2009   INR 1.12 08/13/2015   HGBA1C 7.4* 03/17/2015   MICROALBUR 0.5 02/04/2009    BNP (last 3 results)  Recent Labs  08/13/15 1829  BNP 318.4*    ProBNP (last 3 results) No results for input(s): PROBNP in the last 8760 hours.   Other Studies Reviewed Today:  Echo Study Conclusions from 08/2015  - Left  ventricle: The cavity size was normal. Wall thickness was  increased in a pattern of mild LVH. Systolic function was normal.  The estimated ejection fraction was in the range of 55% to 60%.  There is hypokinesis of the inferolateral myocardium. Doppler  parameters are consistent with abnormal left ventricular  relaxation (grade 1 diastolic dysfunction). Doppler parameters  are consistent with high ventricular filling pressure. - Aortic valve: Valve mobility was restricted. There was mild  stenosis. There was mild regurgitation. Valve area (VTI): 1.87  cm^2. Valve area (Vmax): 2.08 cm^2. Valve area (Vmean): 1.9 cm^2. - Mitral valve: Calcified annulus. Mildly thickened leaflets .  There was mild regurgitation. - Left atrium: The atrium was mildly dilated. - Pulmonary arteries: Systolic pressure was mildly increased. PA  peak pressure: 36 mm Hg (S).  Impressions:  - Hypokinesis of the inferior lateral wall with overall preserved  LV function; grade 1 diastolic dysfunction with elevated LV  filling pressure; mild LAE; calcified aortic valve with mild AS  and mild AI; mild MR and TR; mildly elevated pulmonary pressure.  Procedures    Left Heart Cath and Cors/Grafts Angiography    Conclusion     Prox RCA lesion, 100% stenosed.  LIMA .  The LIMA to LAD graft is widely patent without stenosis.  Sequential SVG .  The sequential saphenous vein graft to OM1 and OM 2 has critical stenosis within the previously stented segment in the proximal body of the graft.  SVG .  Graft known to be totally occluded, not selectively injected  Origin lesion, 100% stenosed.  SVG .  The graft is chronically occluded  Prox Graft lesion, 100% stenosed.  Prox LAD lesion, 100% stenosed.  Ost LM lesion, 80% stenosed.  Ost 2nd Mrg to 2nd Mrg lesion, 90% stenosed.  Prox Graft to Mid Graft lesion, before 1st Mrg, 99% stenosed. Post intervention, there is a 0% residual  stenosis. The lesion was previously treated with a stent (unknown type).  1. Severe native three-vessel coronary artery disease with severe left main stenosis, total occlusion of the LAD, total occlusion of the RCA, and severe native left circumflex stenosis  2. Status post aortocoronary bypass surgery with continued patency of the LIMA to LAD and continued patency of the saphenous vein graft to OM with severe stenosis in the proximal body of the graft (in-stent)  3. Chronic total occlusion of the saphenous graft RCA and saphenous vein graft to diagonal  4. Successful PCI of the saphenous vein graft OM with placement of a drug-eluting stent(treatment of in-stent restenosis)  Recommendation: dual antiplatelet therapy with aspirin and Plavix at least 12 months. Consider long-term therapy if tolerated in this patient with previous bypass grafting who has required multiple PCI procedures.         Assessment/Plan: 1. Extensive CAD with recent PCI - committed to DAPT - symptoms now resolved with current therapy.   2. HTN - BP recheck by me looks fine - ok on current regimen  3. HLD - now on high dose statin - would plan on repeating labs in about 3 months  4. Chronic dyspnea - using Oxygen at night - follows with pulmonary as well.   5. Mild AS - would follow.   7. Anemia - rechecking labs today.   8. CKD - rechecking lab today.         Current medicines are reviewed with the patient today.  The patient does not have concerns regarding medicines other than what has been noted above.  The following changes have been made:  See above.  Labs/ tests ordered today include:    Orders Placed This Encounter  Procedures  . Basic metabolic panel  . CBC     Disposition:   FU with Dr. Johnsie [name] in September as planned.  Patient is agreeable to this plan and will call if any problems develop in the interim.   Signed: Burtis Junes, RN, ANP-C 09/17/2015 10:44 AM  [facility] [address] [place], [place]  [address] Phone: [phone] Fax: [phone]

## 2015-09-17 NOTE — Telephone Encounter (Signed)
Was calling pt to find out what doctor pt had recent labs at so I can get faxed copy.  Lori answered question.

## 2015-09-19 ENCOUNTER — Ambulatory Visit: Payer: Medicare Other | Admitting: Neurology

## 2015-09-24 ENCOUNTER — Encounter: Payer: Self-pay | Admitting: Neurology

## 2015-09-24 ENCOUNTER — Ambulatory Visit (INDEPENDENT_AMBULATORY_CARE_PROVIDER_SITE_OTHER): Payer: Medicare Other | Admitting: Neurology

## 2015-09-24 VITALS — BP 122/64 | HR 78 | Ht 73.0 in | Wt 214.0 lb

## 2015-09-24 DIAGNOSIS — E0842 Diabetes mellitus due to underlying condition with diabetic polyneuropathy: Secondary | ICD-10-CM

## 2015-09-24 DIAGNOSIS — M0550 Rheumatoid polyneuropathy with rheumatoid arthritis of unspecified site: Secondary | ICD-10-CM

## 2015-09-24 MED ORDER — LIDOCAINE 5 % EX OINT: TOPICAL_OINTMENT | CUTANEOUS | Status: DC

## 2015-09-24 NOTE — Progress Notes (Signed)
Follow-up Visit   Date: 09/24/2015    [name] MRN: [id number] DOB: 22-May-1931   Interim History: [name] is a 82 y.o. right-handed Caucasian male with CAD, GERD, interstitial lung disease, rheumatoid arthritis (on imuran), aortic stenosis, CKD stage 3, hyperlipidemia, and hypertension returning to the clinic for follow-up of neuropathy.  The patient was accompanied to the clinic by self.  History of present illness: He was diagnosed with diabetes about 10-15 years ago and recall havinghaving burning pain involving the toes. Since then, there has been gradual worsening in intensity of pain and it now also involves the dorsum of the foot. There is no weakness, imbalance, or gait difficulty. He still walks independently and denies any falls. He takes gabapentin 900mg  in the morning, 600mg  in the afternoon, and 600mg  at bedtime which provides about 50% relief. He has previously tried nortriptyline but does not recall whether it did not work or stopped it because of side effects. He was tried on Cymbalta but this made him very dizzy and sleepy. He has seen podiatry for this and had steroid injections for capsulitis which did not provide much relief. He takes imuran for rheumatoid arthritis. He has chronic low back pain and gets ESI several times per year.   UPDATE 09/24/2015:  Unfortunately, there was no benefit with desimpramine 20mg  at bedtime.  He reports tripping over his oxygen cord an fell about a month ago, but otherwise walks independently and denies imbalance.  His feet continues to be most painful and bothersome.    Medications:  Current Outpatient Prescriptions on File Prior to Visit  Medication Sig Dispense Refill  . amLODipine (NORVASC) 10 MG tablet TAKE 1 TABLET BY MOUTH DAILY 90 tablet 3  . aspirin EC 81 MG tablet Take 81 mg by mouth at bedtime.    [name] atorvastatin (LIPITOR) 80 MG tablet Take 1 tablet (80 mg total) by mouth daily at 6 PM. 30 tablet 6  .  azaTHIOprine (IMURAN) 50 MG tablet Take 100 mg by mouth 2 (two) times daily.     . carvedilol (COREG) 12.5 MG tablet TAKE 1 TABLET BY MOUTH TWICE A DAY WITH MEALS 60 tablet 9  . clopidogrel (PLAVIX) 75 MG tablet Take 1 tablet (75 mg total) by mouth daily with breakfast. 30 tablet 6  . DHA-Vitamin C-Lutein (EYE HEALTH FORMULA PO) Take 1 tablet by mouth daily.     . furosemide (LASIX) 40 MG tablet TAKE ONE OR TWO TABLETS BY MOUTH EVERY DAY. 60 tablet 11  . gabapentin (NEURONTIN) 300 MG capsule TAKE 3 CAPSULES BY MOUTH EVERY MORNING AND TAKE 4 TO 5 CAPSULES BY MOUTH EVERY NIGHT AT BEDTIM 720 capsule 0  . isosorbide mononitrate (IMDUR) 60 MG 24 hr tablet Take 1 tablet (60 mg total) by mouth daily. 90 tablet 3  . losartan (COZAAR) 100 MG tablet TAKE 1 TABLET BY MOUTH DAILY 90 tablet 3  . metFORMIN (GLUCOPHAGE) 1000 MG tablet Take 1 tablet (1,000 mg total) by mouth 2 (two) times daily with a meal. 60 tablet 11  . Multiple Vitamin (MULTIVITAMIN WITH MINERALS) TABS tablet Take 1 tablet by mouth daily.    . nitroGLYCERIN (NITROSTAT) 0.4 MG SL tablet DISSOLVE 1 TABLET UNDER THE TONGUE FOR CHEST PAIN. MAY REPEAT EVERY 5MINUTES UP TO 3 DOSES. IF NO RELIEF, CALL 911** (Patient not taking: Reported on 09/24/2015) 25 tablet 5   No current facility-administered medications on file prior to visit.    Allergies:  Allergies  Allergen  Reactions  . Doxazosin Mesylate Other (See Comments)    dizziness  . Methocarbamol Rash    Review of Systems:  CONSTITUTIONAL: No fevers, chills, night sweats, or weight loss.  EYES: No visual changes or eye pain ENT: No hearing changes.  No history of nose bleeds.   RESPIRATORY: No cough, wheezing and shortness of breath.   CARDIOVASCULAR: Negative for chest pain, and palpitations.   GI: Negative for abdominal discomfort, blood in stools or black stools.  No recent change in bowel habits.   GU:  No history of incontinence.   MUSCLOSKELETAL: No history of joint pain or  swelling.  No myalgias.   SKIN: Negative for lesions, rash, and itching.   ENDOCRINE: Negative for cold or heat intolerance, polydipsia or goiter.   PSYCH:  No depression or anxiety symptoms.   NEURO: As Above.   Vital Signs:  BP 122/64 mmHg  Pulse 78  Ht 6\' 1"  (1.854 m)  Wt 214 lb (97.07 kg)  BMI 28.24 kg/m2  SpO2 94%  Neurological Exam: MENTAL STATUS including orientation to time, place, person, recent and remote memory, attention span and concentration, language, and fund of knowledge is normal.  Speech is not dysarthric.  CRANIAL NERVES:  Face is symmetric.  MOTOR:  Motor strength is 5/5 in all extremities  SENSORY: Temperature, vibration reduced distal to ankles bilaterally.   COORDINATION/GAIT:   Gait narrow based and stable.   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 He is already taking gabapentin 900-600-600 and with his CKD, I would not increase this further.  - Previously tried medications: Cymbalta, nortriptyline,  - Discontinue desipramine due to no benefit - Start lidocaine ointment to feet, if this is too expensive, he can try OTC Aspercream .  - Encouraged tight glycemic control  Return to clinic in 3 months.   The duration of this appointment visit was 15 minutes of face-to-face time with the patient.  Greater than 50% of this time was spent in counseling, explanation of diagnosis, planning of further management, and coordination of care.   Thank you for allowing me to participate in patient's care.  If I can answer any additional questions, I would be pleased to do so.    Sincerely,    Jerick Khachatryan K. Posey Pronto, DO

## 2015-09-24 NOTE — Patient Instructions (Addendum)
You can start using lidocaine ointment to your feet.  If this is too expensive, try Aspercream. Stop desipramine   Return to clinic in 3 months

## 2015-10-06 ENCOUNTER — Encounter: Payer: Self-pay | Admitting: Internal Medicine

## 2015-10-06 ENCOUNTER — Ambulatory Visit (INDEPENDENT_AMBULATORY_CARE_PROVIDER_SITE_OTHER): Payer: Medicare Other | Admitting: Internal Medicine

## 2015-10-06 VITALS — BP 118/66 | HR 61 | Temp 97.7°F | Ht 73.0 in | Wt 217.0 lb

## 2015-10-06 DIAGNOSIS — I25119 Atherosclerotic heart disease of native coronary artery with unspecified angina pectoris: Secondary | ICD-10-CM

## 2015-10-06 DIAGNOSIS — Z0000 Encounter for general adult medical examination without abnormal findings: Secondary | ICD-10-CM | POA: Diagnosis not present

## 2015-10-06 DIAGNOSIS — E1122 Type 2 diabetes mellitus with diabetic chronic kidney disease: Secondary | ICD-10-CM

## 2015-10-06 DIAGNOSIS — N183 Chronic kidney disease, stage 3 unspecified: Secondary | ICD-10-CM

## 2015-10-06 DIAGNOSIS — J849 Interstitial pulmonary disease, unspecified: Secondary | ICD-10-CM | POA: Diagnosis not present

## 2015-10-06 DIAGNOSIS — I700 Atherosclerosis of aorta: Secondary | ICD-10-CM

## 2015-10-06 DIAGNOSIS — J9611 Chronic respiratory failure with hypoxia: Secondary | ICD-10-CM

## 2015-10-06 DIAGNOSIS — E1140 Type 2 diabetes mellitus with diabetic neuropathy, unspecified: Secondary | ICD-10-CM

## 2015-10-06 DIAGNOSIS — IMO0001 Reserved for inherently not codable concepts without codable children: Secondary | ICD-10-CM

## 2015-10-06 DIAGNOSIS — I5032 Chronic diastolic (congestive) heart failure: Secondary | ICD-10-CM

## 2015-10-06 DIAGNOSIS — M059 Rheumatoid arthritis with rheumatoid factor, unspecified: Secondary | ICD-10-CM

## 2015-10-06 LAB — HEMOGLOBIN A1C: Hgb A1c MFr Bld: 7.3 % — ABNORMAL HIGH (ref 4.6–6.5)

## 2015-10-06 LAB — HM DIABETES FOOT EXAM

## 2015-10-06 NOTE — Assessment & Plan Note (Signed)
Doing well with just oxygen at night

## 2015-10-06 NOTE — Assessment & Plan Note (Signed)
And stenosis Mild orthostatic dizziness--but otherwise no symptoms of sig stenosis

## 2015-10-06 NOTE — Progress Notes (Signed)
Subjective:    Patient ID: [name], male    DOB: 11/03/1931, 82 y.o.   MRN: [id number]  HPI Here for Medicare wellness and follow up of chronic health conditions Reviewed form and advanced directives Reviewed other doctors--pulmonary, cardiology, podiatry, neurology all via [facility] Dr Florene Glen? For kidneys Continues to [employer] (riding mower) and other household tasks. Still drives Does all his instrumental ADLs No tobacco or alcohol Fell once this year--mild injury to nose (abrasion) Vision is okay Hearing is not great---uses aides No major cognitive changes No depression or anhedonia  Reviewed recent blood work from [name] She prescribes the imuran for the RA Gives at least 75% control of this  Ongoing neuropathy Desipramine no help Lidocaine topical not helpful either  Hospitalized for chest pain and had angioplasty recently On DAPT for now No chest pain since then Occasional dizziness upon arising No palpitations  Breathing is not good DOE with minimal walking Ongoing care with Dr Chase Caller No regular cough--occ though with some sputum Gets congested in nose with rhinorrhea at times May have trouble talking when this all acts up Uses oxygen at night  Checks sugars once a week or so Usually 150's to 160's No hypoglycemic reactions Ongoing foot pain  Current Outpatient Prescriptions on File Prior to Visit  Medication Sig Dispense Refill  . amLODipine (NORVASC) 10 MG tablet TAKE 1 TABLET BY MOUTH DAILY 90 tablet 3  . aspirin EC 81 MG tablet Take 81 mg by mouth at bedtime.    [name] atorvastatin (LIPITOR) 80 MG tablet Take 1 tablet (80 mg total) by mouth daily at 6 PM. 30 tablet 6  . azaTHIOprine (IMURAN) 50 MG tablet Take 100 mg by mouth 2 (two) times daily.     . carvedilol (COREG) 12.5 MG tablet TAKE 1 TABLET BY MOUTH TWICE A DAY WITH MEALS 60 tablet 9  . clopidogrel (PLAVIX) 75 MG tablet Take 1 tablet (75 mg total) by mouth daily with breakfast.  30 tablet 6  . DHA-Vitamin C-Lutein (EYE HEALTH FORMULA PO) Take 1 tablet by mouth daily.     . furosemide (LASIX) 40 MG tablet TAKE ONE OR TWO TABLETS BY MOUTH EVERY DAY. 60 tablet 11  . gabapentin (NEURONTIN) 300 MG capsule TAKE 3 CAPSULES BY MOUTH EVERY MORNING AND TAKE 4 TO 5 CAPSULES BY MOUTH EVERY NIGHT AT BEDTIM 720 capsule 0  . isosorbide mononitrate (IMDUR) 60 MG 24 hr tablet Take 1 tablet (60 mg total) by mouth daily. 90 tablet 3  . lidocaine (XYLOCAINE) 5 % ointment Apply to feet twice daily.  Use gloves or be sure to wash hands afterwards. 35 g 5  . losartan (COZAAR) 100 MG tablet TAKE 1 TABLET BY MOUTH DAILY 90 tablet 3  . metFORMIN (GLUCOPHAGE) 1000 MG tablet Take 1 tablet (1,000 mg total) by mouth 2 (two) times daily with a meal. 60 tablet 11  . Multiple Vitamin (MULTIVITAMIN WITH MINERALS) TABS tablet Take 1 tablet by mouth daily.    . nitroGLYCERIN (NITROSTAT) 0.4 MG SL tablet DISSOLVE 1 TABLET UNDER THE TONGUE FOR CHEST PAIN. MAY REPEAT EVERY 5MINUTES UP TO 3 DOSES. IF NO RELIEF, CALL 911** 25 tablet 5   No current facility-administered medications on file prior to visit.    Allergies  Allergen Reactions  . Doxazosin Mesylate Other (See Comments)    dizziness  . Methocarbamol Rash    Past Medical History  Diagnosis Date  . Hyperlipidemia   . Hypertension   .  GERD (gastroesophageal reflux disease)   . Overweight (BMI 25.0-29.9)     BMI 29  . Interstitial lung disease ([name])     NOS  . Diverticulosis   . Esophageal stricture     a. s/p dilation spring 2010  . CAD (coronary artery disease)     a. s/p CABG (2001)  b. s/p DES to RCA and cutting POBA to ostial PDA (2013)   c. s/p DES to SVG to OM2 (01/14/14) d. cath: 08/2015 NSTEMI w/ patent LIMA-LAD and 99% stenosis of SVG-OM w/ DES placed. CTO of SVG-RCA and SVG-D1.   . History of PFTs     mixed pattern on spiro. mild restn on lung volumes with near normal DLCO. Pattern can be explained by CABG scar. Fev1 2.2L/73%,  ratio 68 (67), TLC 4.7/68%,RV 1.5L/55%,DLCO 79%  . Colon polyps   . Chronic kidney disease, stage III (moderate)   . Chronic diastolic CHF (congestive heart failure) ([place])     a) 09/13 ECHO- LVEF 99991111, grade 1 diastolic dysfunction, mild LA dilatation, atrial septal aneurysm, AV mobility restricted, but no sig AS by doppler; b) 09/04/08 ECHO- LVH, ef 60%, mild AS, c. echo 08/2015: EF perserved of 55-60% with inferolateral HK. Mild AS noted.  [name] Heart murmur   . Chronic lower back pain   . Dyspnea 2009 since July -Sept    05/06/08-[facility]-  normal effort, reduced VO2 max 20.5 /65%, reduced at 8.2/ 40%, normal breathing resetvca of 55%, submaximal heart rate response 112/77%, flattened o2 pluse response at peak exercise-12 ml/beat @ 85%, No VQ mismatch abnormalities, All c/w CIRC Limitation  . Hiatal hernia   . Type II diabetes mellitus ([place])   . Arthritis     osteoarthritis, s/p R TKR, and digits  . RA (rheumatoid arthritis) ([name])     [name]  . Seropositive rheumatoid arthritis ([place])   . Enlarged prostate     Past Surgical History  Procedure Laterality Date  . Total knee arthroplasty Right 03/2010    [name]  . Knee arthroscopy Right 2008  . Cataract extraction w/ intraocular lens  implant, bilateral Bilateral   . Coronary artery bypass graft  11/1999    CABG X5  . Coronary stent placement  02/2012    1 stent and balloon  . Shoulder arthroscopy with open rotator cuff repair and distal clavicle acrominectomy Left 02/27/2013    Procedure: LEFT SHOULDER ARTHROSCOPY WITH MINI OPEN ROTATOR CUFF REPAIR AND SUBACROMIAL DECOMPRESSION AND DISTAL CLAVICLE RESECTION;  Surgeon: [name], MD;  Location: [name];  Service: Orthopedics;  Laterality: Left;  . Trigger finger release Left 02/27/2013    Procedure: RELEASE TRIGGER FINGER/A-1 PULLEY;  Surgeon: [name], MD;  Location: [place];  Service: Orthopedics;  Laterality: Left;  . Cholecystectomy open  11/2003    [name]  . Joint  replacement    . Esophagogastroduodenoscopy (egd) with esophageal dilation  2010  . Cardiac catheterization  08/2004    CP- no MI, Cath- small vessell disease   . Cardiac catheterization  12/31/2011    80% distal LM, 100% native LAD, LCx and RCA, 30% prox SVG-OM, SVG-D1 normal, 99% distal, 80% ostial SVG-RCA distal to graft, LIMA-LAD normal; LVEF mildly decreased with posterior basal AK   . Coronary angioplasty with stent placement  01/03/2012    Successful DES to SVG-RCA and cutting balloon angioplasty ostial  PDA   . Coronary angioplasty with stent placement  01/14/2014    "1"  . Cardiac catheterization  2009    with patent grafts/notes 12/31/2011  . Left and right heart catheterization with coronary angiogram  12/31/2011    Procedure: LEFT AND RIGHT HEART CATHETERIZATION WITH CORONARY ANGIOGRAM;  Surgeon: Burnell Blanks, MD;  Location: [facility] CATH LAB;  Service: Cardiovascular;;  . Percutaneous coronary stent intervention (pci-s)  12/31/2011    Procedure: PERCUTANEOUS CORONARY STENT INTERVENTION (PCI-S);  Surgeon: Burnell Blanks, MD;  Location: [facility] CATH LAB;  Service: Cardiovascular;;  . Percutaneous coronary stent intervention (pci-s) N/A 01/03/2012    Procedure: PERCUTANEOUS CORONARY STENT INTERVENTION (PCI-S);  Surgeon: Peter M Martinique, MD;  Location: [facility] CATH LAB;  Service: Cardiovascular;  Laterality: N/A;  . Percutaneous coronary intervention-balloon only  01/03/2012    Procedure: PERCUTANEOUS CORONARY INTERVENTION-BALLOON ONLY;  Surgeon: Peter M Martinique, MD;  Location: [facility] CATH LAB;  Service: Cardiovascular;;  . Left and right heart catheterization with coronary angiogram N/A 01/14/2014    Procedure: LEFT AND RIGHT HEART CATHETERIZATION WITH CORONARY ANGIOGRAM;  Surgeon: Peter M Martinique, MD;  Location: [facility] CATH LAB;  Service: Cardiovascular;  Laterality: N/A;  . Hand surgery    . Cardiac catheterization N/A 08/13/2015    Procedure: Left Heart Cath and Cors/Grafts Angiography;  Surgeon:  Sherren Mocha, MD;  Location: [place] CV LAB;  Service: Cardiovascular;  Laterality: N/A;    Family History  Problem Relation Age of Onset  . COPD Mother   . Heart disease Father   . Diabetes Brother   . Colon cancer Brother 25  . Alcohol abuse Sister   . Heart attack Father   . Stroke Sister     Social History   Social History  . Marital Status: Married    Spouse Name: N/A  . Number of Children: 3  . Years of Education: N/A   Occupational History  . lawn mower     retired   Social History Main Topics  . Smoking status: Former Smoker -- 1.00 packs/day for 20 years    Types: Cigarettes    Quit date: 04/06/1963  . Smokeless tobacco: Never Used  . Alcohol Use: No     Comment: 01/01/2012 "last alcohol ~ 50 yr ago"  . Drug Use: No  . Sexual Activity: Not Currently   Other Topics Concern  . Not on file   Social History Narrative   No living will   Requests wife as health care POA   Discussed DNR --he requests this (done 08/29/12)   Not sure about feeding tube---but might accept for some time   Patient lives with wife and daughter in a one story home.  Has 3 children.  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   Review of Systems Appetite is good Weight fairly stable Sleeps fair Wears seat belt Teeth okay--keeps up with dentist (Dr Olena Heckle) Bowels are fine--no blood Voids okay. Flow is slow but he seems to empty okay No skin problems Occasional dysphagia--did see Dr Henrene Pastor about this. EGD/dilation not practical due to other medical conditions.    Objective:   Physical Exam  Constitutional: He is oriented to person, place, and time. He appears well-developed and well-nourished. No distress.  HENT:  Mouth/Throat: Oropharynx is clear and moist. No oropharyngeal exudate.  Neck: Normal range of motion. Neck supple. No thyromegaly present.  Slight right sided neck tenderness--but no mass or findings   Cardiovascular: Normal rate, regular rhythm and  intact distal pulses.  Exam reveals no gallop.   Gr 2/6 aortic systolic murmur  Pulmonary/Chest: Effort normal.  No respiratory distress. He has no wheezes.  Slightly decreased breath sounds and bibasilar crackles  Abdominal: Soft. There is no tenderness.  Musculoskeletal: He exhibits no edema or tenderness.  Lymphadenopathy:    He has no cervical adenopathy.  Neurological: He is alert and oriented to person, place, and time.  President-- "[employer]" [phone] D-l-o-r-w Recall 3/3  Very little sensation in feet  Skin: No rash noted. No erythema.  No foot lesions  Psychiatric: He has a normal mood and affect. His behavior is normal.          Assessment & Plan:

## 2015-10-06 NOTE — Assessment & Plan Note (Signed)
Part of his underlying DOE

## 2015-10-06 NOTE — Assessment & Plan Note (Signed)
I have personally reviewed the Medicare Annual Wellness questionnaire and have noted 1. The patient's medical and social history 2. Their use of alcohol, tobacco or illicit drugs 3. Their current medications and supplements 4. The patient's functional ability including ADL's, fall risks, home safety risks and hearing or visual             impairment. 5. Diet and physical activities 6. Evidence for depression or mood disorders  The patients weight, height, BMI and visual acuity have been recorded in the chart I have made referrals, counseling and provided education to the patient based review of the above and I have provided the pt with a written personalized care plan for preventive services.  I have provided you with a copy of your personalized plan for preventive services. Please take the time to review along with your updated medication list.  No cancer screening due to age No zostavax due to azathioprine Yearly flu vaccine

## 2015-10-06 NOTE — Assessment & Plan Note (Signed)
Fair control on the azathioprine

## 2015-10-06 NOTE — Progress Notes (Signed)
Pre visit review using our clinic review tool, if applicable. No additional management support is needed unless otherwise documented below in the visit note. 

## 2015-10-06 NOTE — Assessment & Plan Note (Signed)
Fluid status is better Weight down but still needs the furosemide

## 2015-10-06 NOTE — Assessment & Plan Note (Signed)
No pain now on nitrates

## 2015-10-06 NOTE — Assessment & Plan Note (Signed)
Hopefully still acceptable control Will check A1c

## 2015-10-06 NOTE — Assessment & Plan Note (Signed)
On ARB 

## 2015-10-15 ENCOUNTER — Other Ambulatory Visit: Payer: Self-pay | Admitting: Cardiovascular Disease

## 2015-10-15 ENCOUNTER — Other Ambulatory Visit: Payer: Self-pay | Admitting: Neurology

## 2015-10-15 NOTE — Telephone Encounter (Signed)
Rx refused.  Last note says it was d/c'd since it was not working.

## 2015-10-16 DIAGNOSIS — J8410 Pulmonary fibrosis, unspecified: Secondary | ICD-10-CM | POA: Diagnosis not present

## 2015-10-16 DIAGNOSIS — R0689 Other abnormalities of breathing: Secondary | ICD-10-CM | POA: Diagnosis not present

## 2015-11-10 DIAGNOSIS — Z79899 Other long term (current) drug therapy: Secondary | ICD-10-CM | POA: Diagnosis not present

## 2015-11-10 LAB — BASIC METABOLIC PANEL: Creatinine: 1.7 mg/dL — AB (ref 0.6–1.3)

## 2015-11-10 LAB — CBC AND DIFFERENTIAL
HCT: 33 % — AB (ref 41–53)
HEMOGLOBIN: 11.1 g/dL — AB (ref 13.5–17.5)

## 2015-11-12 DIAGNOSIS — M47816 Spondylosis without myelopathy or radiculopathy, lumbar region: Secondary | ICD-10-CM | POA: Diagnosis not present

## 2015-11-16 DIAGNOSIS — R0689 Other abnormalities of breathing: Secondary | ICD-10-CM | POA: Diagnosis not present

## 2015-11-16 DIAGNOSIS — J8410 Pulmonary fibrosis, unspecified: Secondary | ICD-10-CM | POA: Diagnosis not present

## 2015-11-19 DIAGNOSIS — L570 Actinic keratosis: Secondary | ICD-10-CM | POA: Diagnosis not present

## 2015-12-09 ENCOUNTER — Encounter: Payer: Self-pay | Admitting: Internal Medicine

## 2015-12-09 ENCOUNTER — Ambulatory Visit (INDEPENDENT_AMBULATORY_CARE_PROVIDER_SITE_OTHER): Payer: Medicare Other | Admitting: Internal Medicine

## 2015-12-09 ENCOUNTER — Telehealth: Payer: Self-pay | Admitting: Internal Medicine

## 2015-12-09 VITALS — BP 116/72 | HR 62 | Ht 73.0 in | Wt 217.0 lb

## 2015-12-09 DIAGNOSIS — R0600 Dyspnea, unspecified: Secondary | ICD-10-CM | POA: Diagnosis not present

## 2015-12-09 DIAGNOSIS — J849 Interstitial pulmonary disease, unspecified: Secondary | ICD-10-CM

## 2015-12-09 DIAGNOSIS — J9611 Chronic respiratory failure with hypoxia: Secondary | ICD-10-CM | POA: Diagnosis not present

## 2015-12-09 NOTE — Patient Instructions (Signed)
ICD-9-CM ICD-10-CM   1. Dyspnea 786.09 R06.00   2. Chronic respiratory failure with hypoxia ([name]) 518.83 J96.11    799.02    3. ILD (interstitial lung disease) ([place]) 515 J84.9    Glad you are som better after stent placement I will write to DR Johnsie Cancel to see if we can do a right heart cath for you Continue rheumatoid arthtrritics treatment per Dr D Not sure why [facility] Dr Deeann Dowse asked you to stop acid reflux treatment - Icould not see it in chart Flu shot in fall - high dose preferred  followup  62months or sooner

## 2015-12-09 NOTE — Telephone Encounter (Signed)
[name] seems to have elevated PASP on echo may 2017. He has RA and wondering if you can consider right heart cath for him. PAH Rx could potentiall help his dyspnea  Thanks  Dr. Brand Males, M.D., [facility].C.P Pulmonary and Critical Care Medicine Staff Physician [place] Pager: [phone], If no answer or between  15:00h - 7:00h: call [phone]  12/09/2015 10:50 AM

## 2015-12-09 NOTE — Progress Notes (Signed)
Subjective:     Patient ID: [name], male   DOB: 04/30/1931, 82 y.o.   MRN: [id number]  PCP Viviana Simpler, MD   HPI   82 yo male former smoker with RA-ILD   TEST     - Coronary artery disease. s/p CABG x 5 in 2002.  Dyspnea started following lopressor Jan-July 2009 and lisinopril increae July 2009. No relief despite stopping agents July 2010  -   Myoview on April 17, 2007 was  nonischemic with an EF of 51% and inferobasal wall scar.   - CAth Oct 2009:    - . Severe native three-vessel coronary artery disease. 2. Status post multivessel coronary bypass surgery with all grafts  patent. Normal LVEF and LVEDP - PFT - Mixed pattern on spiro. Mild restn on lung volumeswith near normal DLCO.   - Fev1 2.2L/73%, FVC 3.21L/70%, Ratio 68 (67),  TLC 4.7L/68%, RV 1.5L/55%, DLCO 79%.  -CPST 05/06/2008: Normal effot.    - Reduced VO2 max 20.5/65%, REduced AT 8.2/40%, Normal breathing reservce of 55%, submaximal heart rate response 112/77%,  flattened O2 pulse response at peak exercise - 38ml/beat at 85%. No VQ mismatch abnormalities. - ECHO 09/04/2008: LVH, ef 60%, mild AS and unchanged from prior   - CT chest   - Non specific Interstitial Lung disease NOS. Stable pulm infilratates RLL ggo > LLL ggo. 03/2008 -> 10/2008 -> June 2011: stable - Rehab: never attended 2009-2013 due to cost co.  2013 Walking desat test : resting 98/94% -> 3 laps x 185 feet; HR 113/pulse ox 96%    PFTs July 2013 show worsening restriction since 2010 along with reduced diffusion. The RLL baseline GGO might be worse in July 2013 compared to 2011. In addition, autommune profile shows VERY HIGH TITERS of CCP antibody > 300 which is pathognomonic of rheumtoid arthritis lung involvement. RF is only borderline elevated a 23  PFT 10/12/11>>FEV1 2.02 (70%), Fvc 2.8/72%, TLC 4.07 (59%), DLCO 62%, no BD: RESTRICTION - WORSE since 2010 -(2010:  Fev1 2.2L/73%, FVC 3.21L/70%, Ratio 68 (67),  TLC 4.7L/68%, RV 1.5L/55%, DLCO 79%)    Echo 05/04/11>>mild LVH, EF 55 to [id number], grade 1 diastolic dysfx, mild AS, mild MR, PAS 34 mmHg  CT 11/02/11 - calcification suspicious for aortic valve and some GGO esp RLL > LLL (? RLL worse compared to 2011)   [place] 12/31/11: dLM 80%, LAD 100%, circumflex 100%, RCA 100%, proximal SVG-OM 30%, SVG-D1 ok, SVG-RCA 99% distal and 80% ostial PDA distal to graft, LIMA-LAD ok with some left to right collaterals.  PCI 01/03/12: Promus DES to the SVG-RCA and Cutting Balloon angioplasty to the ostial PDA.  Echocardiogram 12/31/11: EF 99991111, grade 1 diastolic dysfunction, mild aortic stenosis, mean gradient 9, mild LAE.   Walking desaturation test on 05/05/2012 185 feet x 3 laps:  did NOT desaturate. Rest pulse ox was 98%, final pulse ox was 96%. HR response 69/min at rest to 82/min at peak exertion.   PFT FVC fev1 ratio BD fev1 TLC DLCO comment intervention  2010 3.2L/70% 2.2L/73%   4.7L/68% 79%    10/12/11 2.8L/72% 2.02L/70% 72  4.07L/59% 62% Worsening restriction since 2010 Rheum will start immuran/pred. Also had PCI in nov 2013  04/28/12 2.6L/59% 1.77L/62% 67  4.0L/59% 15.7/71%     '2015 PFT showed no significant change with FEV1 56% , ratio 66,  FVC 61%.   07/31/2015 Follow up : ILD ? RA related  Pt returns for follow up . Has been have more  DOE.  Was set up for a CT chest  PFT on 4/25 showed FEV1 57%, ratio 71, FVC 58%, no sign BD response , + BD in midflows.  DLCO 40%. (this is decreased from 2015- was 59%.  TLC is similar to 2015 . Has been started on Imuran for RA ILD .  CT chest did not show any sgin change , cont Bilateral LL GGO and bronchiectasis .  Remains on O2 2l/m  At bedtime  . ONO showed desats on RA , O2 was continued.  Has been seen by [facility] last year. Now on Imuran, feels Arthritis pain is better.  Says he is feels he is doing okay, gets winded with walking long distances.  Can walk 1/2 block before stopping.  Has dry cough some days.  PVX and Prevnar 13 are utd.   Followed by Dr.  Estil Daft in Rheum. Remains on Imuran. Most pain is in his hands.   Denies chest pain, orthopnea , edema or fever.    OV 12/09/2015   Chief Complaint  Patient presents with  . Follow-up    Feels about the same since the last time we saw him, cough, with some mucus    Follow-up refractory dyspnea in the setting of mild interstitial lung disease from rheumatoid arthritis and coronary artery disease and multifactorial reasons. Refractory to pulmonary rehabilitation.  He did see my nurse practitioner April 2017 and CT chest did not show any changes to his bilateral lower lobe groundglass opacities and bronchiectasis. He is being maintained on Imuran for his rheumatoid arthritis. He is on oxygen 2 L at bedtime.     Had echocardiogram May 2017 that shows elevated pulmonary artery systolic pressure, mild aortic stenosis and diastolic dysfunction. On 08/13/2015 he underwent left heart catheterization by Dr. Sherren Mocha that shows severe native three-vessel coronary artery disease status post bypass with continued patency of the LIMA to LAD and continued patency of the saphenous vein graft to OM with severe stenosis in the proximal body of the graft for which he had successful PCI of a drug-eluting stent treatment of in-stent restenosis. Recommendation is dual antiplatelet therapy lifelong.   He is with his wife currently. He says dyspnea is only some better. He still has significant dyspnea and faitgues more than his wife.   has a past medical history of Arthritis; CAD (coronary artery disease); Chronic diastolic CHF (congestive heart failure) ([place]); Chronic kidney disease, stage III (moderate); Chronic lower back pain; Colon polyps; Diverticulosis; Dyspnea (2009 since July -Sept); Enlarged prostate; Esophageal stricture; GERD (gastroesophageal reflux disease); Heart murmur; Hiatal hernia; History of PFTs; Hyperlipidemia; Hypertension; Interstitial lung disease ([place]); Overweight (BMI 25.0-29.9); RA  (rheumatoid arthritis) ([place]); Seropositive rheumatoid arthritis ([place]); and Type II diabetes mellitus ([place]).   reports that he quit smoking about 52 years ago. His smoking use included Cigarettes. He has a 20.00 pack-year smoking history. He has never used smokeless tobacco.  Past Surgical History:  Procedure Laterality Date  . CARDIAC CATHETERIZATION  08/2004   CP- no MI, Cath- small vessell disease   . CARDIAC CATHETERIZATION  12/31/2011   80% distal LM, 100% native LAD, LCx and RCA, 30% prox SVG-OM, SVG-D1 normal, 99% distal, 80% ostial SVG-RCA distal to graft, LIMA-LAD normal; LVEF mildly decreased with posterior basal AK   . CARDIAC CATHETERIZATION  2009   with patent grafts/notes 12/31/2011  . CARDIAC CATHETERIZATION N/A 08/13/2015   Procedure: Left Heart Cath and Cors/Grafts Angiography;  Surgeon: Sherren Mocha, MD;  Location: [place] CV  LAB;  Service: Cardiovascular;  Laterality: N/A;  . CATARACT EXTRACTION W/ INTRAOCULAR LENS  IMPLANT, BILATERAL Bilateral   . CHOLECYSTECTOMY OPEN  11/2003   [name]  . CORONARY ANGIOPLASTY WITH STENT PLACEMENT  01/03/2012   Successful DES to SVG-RCA and cutting balloon angioplasty ostial  PDA   . CORONARY ANGIOPLASTY WITH STENT PLACEMENT  01/14/2014   "1"  . CORONARY ARTERY BYPASS GRAFT  11/1999   CABG X5  . CORONARY STENT PLACEMENT  02/2012   1 stent and balloon  . ESOPHAGOGASTRODUODENOSCOPY (EGD) WITH ESOPHAGEAL DILATION  2010  . HAND SURGERY    . JOINT REPLACEMENT    . KNEE ARTHROSCOPY Right 2008  . LEFT AND RIGHT HEART CATHETERIZATION WITH CORONARY ANGIOGRAM  12/31/2011   Procedure: LEFT AND RIGHT HEART CATHETERIZATION WITH CORONARY ANGIOGRAM;  Surgeon: Burnell Blanks, MD;  Location: [facility] CATH LAB;  Service: Cardiovascular;;  . LEFT AND RIGHT HEART CATHETERIZATION WITH CORONARY ANGIOGRAM N/A 01/14/2014   Procedure: LEFT AND RIGHT HEART CATHETERIZATION WITH CORONARY ANGIOGRAM;  Surgeon: Peter M Martinique, MD;  Location: [facility] CATH LAB;   Service: Cardiovascular;  Laterality: N/A;  . PERCUTANEOUS CORONARY INTERVENTION-BALLOON ONLY  01/03/2012   Procedure: PERCUTANEOUS CORONARY INTERVENTION-BALLOON ONLY;  Surgeon: Peter M Martinique, MD;  Location: [facility] CATH LAB;  Service: Cardiovascular;;  . PERCUTANEOUS CORONARY STENT INTERVENTION (PCI-S)  12/31/2011   Procedure: PERCUTANEOUS CORONARY STENT INTERVENTION (PCI-S);  Surgeon: Burnell Blanks, MD;  Location: [facility] CATH LAB;  Service: Cardiovascular;;  . PERCUTANEOUS CORONARY STENT INTERVENTION (PCI-S) N/A 01/03/2012   Procedure: PERCUTANEOUS CORONARY STENT INTERVENTION (PCI-S);  Surgeon: Peter M Martinique, MD;  Location: [facility] CATH LAB;  Service: Cardiovascular;  Laterality: N/A;  . SHOULDER ARTHROSCOPY WITH OPEN ROTATOR CUFF REPAIR AND DISTAL CLAVICLE ACROMINECTOMY Left 02/27/2013   Procedure: LEFT SHOULDER ARTHROSCOPY WITH MINI OPEN ROTATOR CUFF REPAIR AND SUBACROMIAL DECOMPRESSION AND DISTAL CLAVICLE RESECTION;  Surgeon: [name], MD;  Location: [place];  Service: Orthopedics;  Laterality: Left;  . TOTAL KNEE ARTHROPLASTY Right 03/2010   [name]  . TRIGGER FINGER RELEASE Left 02/27/2013   Procedure: RELEASE TRIGGER FINGER/A-1 PULLEY;  Surgeon: [name], MD;  Location: [place];  Service: Orthopedics;  Laterality: Left;    Allergies  Allergen Reactions  . Doxazosin Mesylate Other (See Comments)    dizziness  . Methocarbamol Rash    Immunization History  Administered Date(s) Administered  . H1N1 03/27/2008  . Influenza Split 12/29/2010, 01/18/2012  . Influenza Whole 02/03/2007, 01/09/2008, 01/01/2009, 12/31/2009  . Influenza,inj,Quad PF,36+ Mos 12/13/2012, 01/15/2014, 01/15/2015  . Pneumococcal Conjugate-13 09/10/2013  . Pneumococcal Polysaccharide-23 03/05/1997, 08/17/2011  . Td 03/05/1997, 08/17/2011    Family History  Problem Relation Age of Onset  . COPD Mother   . Heart disease Father   . Diabetes Brother   . Colon cancer Brother 25  . Alcohol abuse  Sister   . Heart attack Father   . Stroke Sister      Current Outpatient Prescriptions:  .  amLODipine (NORVASC) 10 MG tablet, TAKE 1 TABLET BY MOUTH DAILY, Disp: 90 tablet, Rfl: 3 .  aspirin EC 81 MG tablet, Take 81 mg by mouth at bedtime., Disp: , Rfl:  .  atorvastatin (LIPITOR) 80 MG tablet, Take 1 tablet (80 mg total) by mouth daily at 6 PM., Disp: 30 tablet, Rfl: 6 .  azaTHIOprine (IMURAN) 50 MG tablet, Take 100 mg by mouth 2 (two) times daily. , Disp: , Rfl:  .  carvedilol (COREG) 12.5 MG tablet, TAKE 1  TABLET BY MOUTH TWICE A DAY WITH MEALS., Disp: 60 tablet, Rfl: 3 .  clopidogrel (PLAVIX) 75 MG tablet, Take 1 tablet (75 mg total) by mouth daily with breakfast., Disp: 30 tablet, Rfl: 6 .  DHA-Vitamin C-Lutein (EYE HEALTH FORMULA PO), Take 1 tablet by mouth daily. , Disp: , Rfl:  .  furosemide (LASIX) 40 MG tablet, TAKE ONE OR TWO TABLETS BY MOUTH EVERY DAY., Disp: 60 tablet, Rfl: 11 .  gabapentin (NEURONTIN) 300 MG capsule, TAKE 3 CAPSULES BY MOUTH EVERY MORNING AND TAKE 4 TO 5 CAPSULES BY MOUTH EVERY NIGHT AT BEDTIM, Disp: 720 capsule, Rfl: 0 .  isosorbide mononitrate (IMDUR) 60 MG 24 hr tablet, Take 1 tablet (60 mg total) by mouth daily., Disp: 90 tablet, Rfl: 3 .  losartan (COZAAR) 100 MG tablet, TAKE 1 TABLET BY MOUTH DAILY, Disp: 90 tablet, Rfl: 3 .  metFORMIN (GLUCOPHAGE) 1000 MG tablet, Take 1 tablet (1,000 mg total) by mouth 2 (two) times daily with a meal., Disp: 60 tablet, Rfl: 11 .  Multiple Vitamin (MULTIVITAMIN WITH MINERALS) TABS tablet, Take 1 tablet by mouth daily., Disp: , Rfl:  .  nitroGLYCERIN (NITROSTAT) 0.4 MG SL tablet, DISSOLVE 1 TABLET UNDER THE TONGUE FOR CHEST PAIN. MAY REPEAT EVERY 5MINUTES UP TO 3 DOSES. IF NO RELIEF, CALL 911**, Disp: 25 tablet, Rfl: 5 .  lidocaine (XYLOCAINE) 5 % ointment, Apply to feet twice daily.  Use gloves or be sure to wash hands afterwards. (Patient not taking: Reported on 12/09/2015), Disp: 35 g, Rfl: 5    Review of Systems      Objective:   Physical Exam  Constitutional: He is oriented to person, place, and time. He appears well-developed and well-nourished. No distress.  HENT:  Head: Normocephalic and atraumatic.  Right Ear: External ear normal.  Left Ear: External ear normal.  Mouth/Throat: Oropharynx is clear and moist. No oropharyngeal exudate.  Eyes: Conjunctivae and EOM are normal. Pupils are equal, round, and reactive to light. Right eye exhibits no discharge. Left eye exhibits no discharge. No scleral icterus.  Neck: Normal range of motion. Neck supple. No JVD present. No tracheal deviation present. No thyromegaly present.  Cardiovascular: Normal rate, regular rhythm and intact distal pulses.  Exam reveals no gallop and no friction rub.   No murmur heard. Pulmonary/Chest: Effort normal. No respiratory distress. He has no wheezes. He has rales. He exhibits no tenderness.  Abdominal: Soft. Bowel sounds are normal. He exhibits no distension and no mass. There is no tenderness. There is no rebound and no guarding.  Musculoskeletal: Normal range of motion. He exhibits no edema or tenderness.  Lymphadenopathy:    He has no cervical adenopathy.  Neurological: He is alert and oriented to person, place, and time. He has normal reflexes. No cranial nerve deficit. Coordination normal.  Skin: Skin is warm and dry. No rash noted. He is not diaphoretic. No erythema. No pallor.  Psychiatric: He has a normal mood and affect. His behavior is normal. Judgment and thought content normal.  Flat affect  Nursing note and vitals reviewed.   Vitals:   12/09/15 1007  BP: 116/72  Pulse: 62  SpO2: 93%  Weight: 217 lb (98.4 kg)  Height: 6\' 1"  (1.854 m)        Assessment:       ICD-9-CM ICD-10-CM   1. Dyspnea 786.09 R06.00   2. Chronic respiratory failure with hypoxia ([name]) 518.83 J96.11    799.02    3. ILD (interstitial lung disease) ([place])  515 J84.9    His dyspnea is refractory. I have told him as such that it'll  be very difficult improved. Nevertheless he slightly better after stent placement which which is encouraging. Need to know if he has autoimmune related pulmonary arterial hypertension is some suggestion that he might have based on echo findings. I will see if cardiology is willing to go right heart cath for him     Plan:      Glad you are som better after stent placement I will write to DR Johnsie Cancel to see if we can do a right heart cath for you Continue rheumatoid arthtrritics treatment per Dr D Not sure why [facility] Dr Deeann Dowse asked you to stop acid reflux treatment - Icould not see it in chart Flu shot in fall - high dose preferred  followup  56months or sooner   Dr. Brand Males, M.D., [facility].C.P Pulmonary and Critical Care Medicine Staff Physician [place] [facility] Pager: [phone], If no answer or between  15:00h - 7:00h: call [phone]  12/09/2015 10:47 AM

## 2015-12-09 NOTE — Telephone Encounter (Signed)
His PA estimate was only 36 mmHg and CT has bronchietasis and scarring Don't usually do right heart for PA pressures that Low ???

## 2015-12-16 NOTE — Telephone Encounter (Signed)
He is so consumed by Dyspnea despite all interventions;  So I figured maybe do a RHC but leave it to your judgement based on echo findings. Given his collagen vasc disease he is at some risk for [facility]   Thanks  Dr. Brand Males, M.D., [facility].C.P Pulmonary and Critical Care Medicine Staff Physician [place] [facility] Critical [facility] Pager: [phone], If no answer or between  15:00h - 7:00h: call [phone]  12/16/2015 8:36 AM

## 2015-12-17 DIAGNOSIS — J8410 Pulmonary fibrosis, unspecified: Secondary | ICD-10-CM | POA: Diagnosis not present

## 2015-12-17 DIAGNOSIS — R0689 Other abnormalities of breathing: Secondary | ICD-10-CM | POA: Diagnosis not present

## 2015-12-23 DIAGNOSIS — M791 Myalgia: Secondary | ICD-10-CM | POA: Diagnosis not present

## 2015-12-23 DIAGNOSIS — M47816 Spondylosis without myelopathy or radiculopathy, lumbar region: Secondary | ICD-10-CM | POA: Diagnosis not present

## 2015-12-23 DIAGNOSIS — M545 Low back pain: Secondary | ICD-10-CM | POA: Diagnosis not present

## 2015-12-23 DIAGNOSIS — M47812 Spondylosis without myelopathy or radiculopathy, cervical region: Secondary | ICD-10-CM | POA: Diagnosis not present

## 2015-12-23 DIAGNOSIS — M542 Cervicalgia: Secondary | ICD-10-CM | POA: Diagnosis not present

## 2015-12-25 ENCOUNTER — Encounter: Payer: Self-pay | Admitting: Neurology

## 2015-12-25 ENCOUNTER — Ambulatory Visit (INDEPENDENT_AMBULATORY_CARE_PROVIDER_SITE_OTHER): Payer: Medicare Other | Admitting: Neurology

## 2015-12-25 VITALS — BP 128/64 | HR 68 | Ht 74.0 in | Wt 226.0 lb

## 2015-12-25 DIAGNOSIS — M0550 Rheumatoid polyneuropathy with rheumatoid arthritis of unspecified site: Secondary | ICD-10-CM | POA: Diagnosis not present

## 2015-12-25 DIAGNOSIS — E0842 Diabetes mellitus due to underlying condition with diabetic polyneuropathy: Secondary | ICD-10-CM

## 2015-12-25 MED ORDER — PREGABALIN 50 MG PO CAPS: 50.0000 mg | ORAL_CAPSULE | Freq: Three times a day (TID) | ORAL | 0 refills | Status: DC

## 2015-12-25 MED ORDER — PREGABALIN 100 MG PO CAPS: 100.0000 mg | ORAL_CAPSULE | Freq: Two times a day (BID) | ORAL | 5 refills | Status: DC

## 2015-12-25 MED ORDER — PREGABALIN 75 MG PO CAPS: 75.0000 mg | ORAL_CAPSULE | Freq: Two times a day (BID) | ORAL | 0 refills | Status: DC

## 2015-12-25 NOTE — Patient Instructions (Addendum)
1.  Adjust gabapentin 300 mg tablets and Lyrica as follows:       Morning       Afternoon         Evening  Week 1 Gabapentin   2 tab            2 tab                 2 tab               Lyrica   50mg      50mg   Week 2 Gabapentin  1 tab          1 tab          1 tab               Lyrica   75mg      75mg   Week 3 Gabapentin  Stop           Lyrica **  100mg     100mg      ** A prescription for 100mg  tablets will be sent  Call with update in 1 month, to determine further increase in medication.  If you develop increased sleepiness, stay at the lower dose.           Return to clinic in 4-5 months

## 2015-12-25 NOTE — Progress Notes (Signed)
Follow-up Visit   Date: 12/25/15    [name] MRN: [id number] DOB: [birth year]   Interim History: [name] is a 82 y.o. right-handed Caucasian male with CAD, GERD, interstitial lung disease, rheumatoid arthritis (on imuran), aortic stenosis, CKD stage 3, hyperlipidemia, and hypertension returning to the clinic for follow-up of neuropathy.  The patient was accompanied to the clinic by self.  History of present illness: He was diagnosed with diabetes about 10-15 years ago and recall havinghaving burning pain involving the toes. Since then, there has been gradual worsening in intensity of pain and it now also involves the dorsum of the foot. There is no weakness, imbalance, or gait difficulty. He still walks independently and denies any falls. He takes gabapentin 900mg  in the morning, 600mg  in the afternoon, and 600mg  at bedtime which provides about 50% relief. He has previously tried nortriptyline but does not recall whether it did not work or stopped it because of side effects. He was tried on Cymbalta but this made him very dizzy and sleepy. He has seen podiatry for this and had steroid injections for capsulitis which did not provide much relief. He takes imuran for rheumatoid arthritis. He has chronic low back pain and gets ESI several times per year.   UPDATE 09/24/2015:  Unfortunately, there was no benefit with desimpramine 20mg  at bedtime.  He reports tripping over his oxygen cord an fell about a month ago, but otherwise walks independently and denies imbalance.  His feet continues to be most painful and bothersome.    UPDATE 12/25/2015:  He only had mild benefit with lidocaine and none with Aspercream, so discontinued both.  The pain is in his feet is unchanged and continues to be bothersome, especially at night.  He would like to try Lyrica.  Despite my recommendations, he reports to taking gabapentin 900mg  in the morning, 600mg  in the afternoon, and 1200mg  at  bedtime.  His GFR is 35 and certainly should not be on this dosage of gabapentin.  He is not feeling very well today because of diarrhea.  Medications:  Current Outpatient Prescriptions on File Prior to Visit  Medication Sig Dispense Refill  . amLODipine (NORVASC) 10 MG tablet TAKE 1 TABLET BY MOUTH DAILY 90 tablet 3  . aspirin EC 81 MG tablet Take 81 mg by mouth at bedtime.    [name] atorvastatin (LIPITOR) 80 MG tablet Take 1 tablet (80 mg total) by mouth daily at 6 PM. 30 tablet 6  . azaTHIOprine (IMURAN) 50 MG tablet Take 100 mg by mouth 2 (two) times daily.     . carvedilol (COREG) 12.5 MG tablet TAKE 1 TABLET BY MOUTH TWICE A DAY WITH MEALS. 60 tablet 3  . clopidogrel (PLAVIX) 75 MG tablet Take 1 tablet (75 mg total) by mouth daily with breakfast. 30 tablet 6  . DHA-Vitamin C-Lutein (EYE HEALTH FORMULA PO) Take 1 tablet by mouth daily.     . furosemide (LASIX) 40 MG tablet TAKE ONE OR TWO TABLETS BY MOUTH EVERY DAY. 60 tablet 11  . gabapentin (NEURONTIN) 300 MG capsule TAKE 3 CAPSULES BY MOUTH EVERY MORNING AND TAKE 4 TO 5 CAPSULES BY MOUTH EVERY NIGHT AT BEDTIM 720 capsule 0  . isosorbide mononitrate (IMDUR) 60 MG 24 hr tablet Take 1 tablet (60 mg total) by mouth daily. 90 tablet 3  . lidocaine (XYLOCAINE) 5 % ointment Apply to feet twice daily.  Use gloves or be sure to wash hands afterwards. 35 g 5  .  losartan (COZAAR) 100 MG tablet TAKE 1 TABLET BY MOUTH DAILY 90 tablet 3  . metFORMIN (GLUCOPHAGE) 1000 MG tablet Take 1 tablet (1,000 mg total) by mouth 2 (two) times daily with a meal. 60 tablet 11  . Multiple Vitamin (MULTIVITAMIN WITH MINERALS) TABS tablet Take 1 tablet by mouth daily.    . nitroGLYCERIN (NITROSTAT) 0.4 MG SL tablet DISSOLVE 1 TABLET UNDER THE TONGUE FOR CHEST PAIN. MAY REPEAT EVERY 5MINUTES UP TO 3 DOSES. IF NO RELIEF, CALL 911** 25 tablet 5   No current facility-administered medications on file prior to visit.     Allergies:  Allergies  Allergen Reactions  .  Doxazosin Mesylate Other (See Comments)    dizziness  . Methocarbamol Rash    Review of Systems:  CONSTITUTIONAL: No fevers, chills, night sweats, or weight loss.  EYES: No visual changes or eye pain ENT: No hearing changes.  No history of nose bleeds.   RESPIRATORY: No cough, wheezing and shortness of breath.   CARDIOVASCULAR: Negative for chest pain, and palpitations.   GI: +for abdominal discomfort, blood in stools or black stools.  No recent change in bowel habits.   GU:  No history of incontinence.   MUSCLOSKELETAL: No history of joint pain or swelling.  No myalgias.   SKIN: Negative for lesions, rash, and itching.   ENDOCRINE: Negative for cold or heat intolerance, polydipsia or goiter.   PSYCH:  No depression or anxiety symptoms.   NEURO: As Above.   Vital Signs:  BP 128/64   Pulse 68   Ht 6\' 2"  (1.88 m)   Wt 226 lb (102.5 kg)   SpO2 95%   BMI 29.02 kg/m   Neurological Exam: MENTAL STATUS including orientation to time, place, person, recent and remote memory, attention span and concentration, language, and fund of knowledge is normal.  Speech is not dysarthric.  CRANIAL NERVES:  Face is symmetric.  MOTOR:  Motor strength is 5/5 in all extremities  SENSORY: Temperature, vibration reduced distal to ankles bilaterally.   COORDINATION/GAIT:   Gait narrow based and stable.   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 He is already taking gabapentin 900-600-600 and with his CKD, I would not increase this further.  - Previously tried medications: Cymbalta, nortriptyline, despiramine, lidocaine ointment, Aspercream - Start Lyrica 50mg  BID and titrate to 100mg  BID - Schedule provided to taper gabapentin and  stop it over 2 weeks  Return to clinic in 4 months.   The duration of this appointment visit was 25 minutes of face-to-face time with the patient.  Greater than 50% of this time was spent in counseling, explanation of diagnosis, planning of further management, and coordination of care.   Thank you for allowing me to participate in patient's care.  If I can answer any additional questions, I would be pleased to do so.    Sincerely,    Mollie Rossano K. Posey Pronto, DO

## 2015-12-25 NOTE — Progress Notes (Signed)
CARDIOLOGY OFFICE NOTE  Date:  01/01/2016    [name] Date of Birth: 10-Dec-1931 Medical Record #[id number]  PCP:  Viviana Simpler, MD  Cardiologist:  Johnsie Cancel  Chief Complaint  Patient presents with  . Chronic diastolic heart failure ([place])  . Ischemic Heart Disease    History of Present Illness: [name] is a 82 y.o. male who presents today for a one month check.   Seen for Dr. Johnsie Cancel.   He has a past medical history of CAD (s/p CABG in 2001 with multiple PCI's since, known occluded SVG-RCA and SVG-D1), mild AS, HTN, HLD, DM and CKD (not on HD).   He was last seen here in the office at the end of March.   He presented to [name] ED on 08/13/2015 for evaluation of chest pain.   He reported having mid-epigastric pain occuring off and on for the past week which was occuring with exertion. It was relieved with rest and SL NTG. He has also noticed dyspnea with exertion. While in the ED, his initial troponin was elevated to 0.49 and EKG showed slight inferolateral ST depressions, new compared to prior EKG in 2016. CBC showed chronic renal insuffiencey with SCr at 1.56 which is his baseline.   With his presenting symptoms, EKG changes, and elevated troponin, a cardiac catheterization was recommended. He was admitted and started on Heparin with anticipation of a cardiac cath later that day.   His catheterization showed a patent LIMA-LAD and 99% stenosis noted in the SVG-OM. PCI with a DES was performed. CTO of SVG-RCA and SVG-D1 was noted. He will be on DAPT for at least 12 months and possibly indefinitely due to a history of multiple PCI procedures. He tolerated the procedure well and no complications were noted.  An echocardiogram was performed prior to discharge which showed a preserved EF of 55-60% with hypokinesis of the inferolateral myocardium. Grade 1 DD was noted along with mild AS, mild AI, mild MR and TR.   He was last examined by Dr. Acie Fredrickson and deemed  stable for discharge. He will continue on ASA, BB, statin (changed to high-intensity this admission), Plavix, and Imdur. Reports his Plavix was discontinued just one month ago and has a new bottle of the medication at home (Refills were sent to his pharmacy with instructions not to fill at this time). A 10-day TCM appointment was arranged prior to discharge.     I saw him last month in the [facility] and he was still having some symptoms but improved. We increased his Imdur.        Comes back today. Here alone today.  He says he is feeling better. He did have some headache - but this has improved. Not dizzy or lightheaded. No chest pain/belly pain. Breathing is stable. He is pleased with how he is doing.   Past Medical History:  Diagnosis Date  . Arthritis    osteoarthritis, s/p R TKR, and digits  . CAD (coronary artery disease)    a. s/p CABG (2001)  b. s/p DES to RCA and cutting POBA to ostial PDA (2013)   c. s/p DES to SVG to OM2 (01/14/14) d. cath: 08/2015 NSTEMI w/ patent LIMA-LAD and 99% stenosis of SVG-OM w/ DES placed. CTO of SVG-RCA and SVG-D1.   [name] Chronic diastolic CHF (congestive heart failure) ([place])    a) 09/13 ECHO- LVEF 23-53%, grade 1 diastolic dysfunction, mild LA dilatation, atrial septal aneurysm, AV mobility restricted, but no sig  AS by doppler; b) 09/04/08 ECHO- LVH, ef 60%, mild AS, c. echo 08/2015: EF perserved of 55-60% with inferolateral HK. Mild AS noted.  . Chronic kidney disease, stage III (moderate)   . Chronic lower back pain   . Colon polyps   . Diverticulosis   . Dyspnea 2009 since July -Sept   05/06/08-CPST-  normal effort, reduced VO2 max 20.5 /65%, reduced at 8.2/ 40%, normal breathing resetvca of 55%, submaximal heart rate response 112/77%, flattened o2 pluse response at peak exercise-12 ml/beat @ 85%, No VQ mismatch abnormalities, All c/w CIRC Limitation  . Enlarged prostate   . Esophageal stricture    a. s/p dilation spring 2010  . GERD (gastroesophageal reflux  disease)   . Heart murmur   . Hiatal hernia   . History of PFTs    mixed pattern on spiro. mild restn on lung volumes with near normal DLCO. Pattern can be explained by CABG scar. Fev1 2.2L/73%, ratio 68 (67), TLC 4.7/68%,RV 1.5L/55%,DLCO 79%  . Hyperlipidemia   . Hypertension   . Interstitial lung disease ([name])    NOS  . Overweight (BMI 25.0-29.9)    BMI 29  . RA (rheumatoid arthritis) ([name])    [name]  . Seropositive rheumatoid arthritis ([place])   . Type II diabetes mellitus ([place])     Past Surgical History:  Procedure Laterality Date  . CARDIAC CATHETERIZATION  08/2004   CP- no MI, Cath- small vessell disease   . CARDIAC CATHETERIZATION  12/31/2011   80% distal LM, 100% native LAD, LCx and RCA, 30% prox SVG-OM, SVG-D1 normal, 99% distal, 80% ostial SVG-RCA distal to graft, LIMA-LAD normal; LVEF mildly decreased with posterior basal AK   . CARDIAC CATHETERIZATION  2009   with patent grafts/notes 12/31/2011  . CARDIAC CATHETERIZATION N/A 08/13/2015   Procedure: Left Heart Cath and Cors/Grafts Angiography;  Surgeon: Sherren Mocha, MD;  Location: [facility];  Service: Cardiovascular;  Laterality: N/A;  . CATARACT EXTRACTION W/ INTRAOCULAR LENS  IMPLANT, BILATERAL Bilateral   . CHOLECYSTECTOMY OPEN  11/2003   [name]  . CORONARY ANGIOPLASTY WITH STENT PLACEMENT  01/03/2012   Successful DES to SVG-RCA and cutting balloon angioplasty ostial  PDA   . CORONARY ANGIOPLASTY WITH STENT PLACEMENT  01/14/2014   "1"  . CORONARY ARTERY BYPASS GRAFT  11/1999   CABG X5  . CORONARY STENT PLACEMENT  02/2012   1 stent and balloon  . ESOPHAGOGASTRODUODENOSCOPY (EGD) WITH ESOPHAGEAL DILATION  2010  . HAND SURGERY    . JOINT REPLACEMENT    . KNEE ARTHROSCOPY Right 2008  . LEFT AND RIGHT HEART CATHETERIZATION WITH CORONARY ANGIOGRAM  12/31/2011   Procedure: LEFT AND RIGHT HEART CATHETERIZATION WITH CORONARY ANGIOGRAM;  Surgeon: Burnell Blanks, MD;  Location: [facility] C[facility];  Service:  Cardiovascular;;  . LEFT AND RIGHT HEART CATHETERIZATION WITH CORONARY ANGIOGRAM N/A 01/14/2014   Procedure: LEFT AND RIGHT HEART CATHETERIZATION WITH CORONARY ANGIOGRAM;  Surgeon: Domenica Weightman M Martinique, MD;  Location: [facility] CATH LAB;  Service: Cardiovascular;  Laterality: N/A;  . PERCUTANEOUS CORONARY INTERVENTION-BALLOON ONLY  01/03/2012   Procedure: PERCUTANEOUS CORONARY INTERVENTION-BALLOON ONLY;  Surgeon: Vollie Brunty M Martinique, MD;  Location: [facility] CATH LAB;  Service: Cardiovascular;;  . PERCUTANEOUS CORONARY STENT INTERVENTION (PCI-S)  12/31/2011   Procedure: PERCUTANEOUS CORONARY STENT INTERVENTION (PCI-S);  Surgeon: Burnell Blanks, MD;  Location: [facility] CATH LAB;  Service: Cardiovascular;;  . PERCUTANEOUS CORONARY STENT INTERVENTION (PCI-S) N/A 01/03/2012   Procedure: PERCUTANEOUS CORONARY STENT INTERVENTION (PCI-S);  Surgeon:  Azaya Goedde M Martinique, MD;  Location: [facility] CATH LAB;  Service: Cardiovascular;  Laterality: N/A;  . SHOULDER ARTHROSCOPY WITH OPEN ROTATOR CUFF REPAIR AND DISTAL CLAVICLE ACROMINECTOMY Left 02/27/2013   Procedure: LEFT SHOULDER ARTHROSCOPY WITH MINI OPEN ROTATOR CUFF REPAIR AND SUBACROMIAL DECOMPRESSION AND DISTAL CLAVICLE RESECTION;  Surgeon: [name], MD;  Location: [place];  Service: Orthopedics;  Laterality: Left;  . TOTAL KNEE ARTHROPLASTY Right 03/2010   [name]  . TRIGGER FINGER RELEASE Left 02/27/2013   Procedure: RELEASE TRIGGER FINGER/A-1 PULLEY;  Surgeon: [name], MD;  Location: [place];  Service: Orthopedics;  Laterality: Left;     Medications: Current Outpatient Prescriptions  Medication Sig Dispense Refill  . amLODipine (NORVASC) 10 MG tablet TAKE 1 TABLET BY MOUTH DAILY 90 tablet 3  . aspirin EC 81 MG tablet Take 81 mg by mouth at bedtime.    [name] atorvastatin (LIPITOR) 80 MG tablet Take 1 tablet (80 mg total) by mouth daily at 6 PM. 30 tablet 6  . azaTHIOprine (IMURAN) 50 MG tablet Take 100 mg by mouth 2 (two) times daily.     . carvedilol (COREG) 12.5 MG  tablet TAKE 1 TABLET BY MOUTH TWICE A DAY WITH MEALS. 60 tablet 3  . clopidogrel (PLAVIX) 75 MG tablet Take 1 tablet (75 mg total) by mouth daily with breakfast. 30 tablet 6  . DHA-Vitamin C-Lutein (EYE HEALTH FORMULA PO) Take 1 tablet by mouth daily.     . furosemide (LASIX) 40 MG tablet TAKE ONE OR TWO TABLETS BY MOUTH EVERY DAY. 60 tablet 11  . isosorbide mononitrate (IMDUR) 60 MG 24 hr tablet Take 1 tablet (60 mg total) by mouth daily. 90 tablet 3  . losartan (COZAAR) 100 MG tablet TAKE 1 TABLET BY MOUTH DAILY 90 tablet 3  . metFORMIN (GLUCOPHAGE) 1000 MG tablet Take 1 tablet (1,000 mg total) by mouth 2 (two) times daily with a meal. 60 tablet 11  . Multiple Vitamin (MULTIVITAMIN WITH MINERALS) TABS tablet Take 1 tablet by mouth daily.    . nitroGLYCERIN (NITROSTAT) 0.4 MG SL tablet DISSOLVE 1 TABLET UNDER THE TONGUE FOR CHEST PAIN. MAY REPEAT EVERY 5MINUTES UP TO 3 DOSES. IF NO RELIEF, CALL 911** 25 tablet 5  . pregabalin (LYRICA) 100 MG capsule Take 1 capsule (100 mg total) by mouth 2 (two) times daily. 60 capsule 5   No current facility-administered medications for this visit.     Allergies: Allergies  Allergen Reactions  . Doxazosin Mesylate Other (See Comments)    dizziness  . Methocarbamol Rash    Social History: The patient  reports that he quit smoking about 52 years ago. His smoking use included Cigarettes. He has a 20.00 pack-year smoking history. He has never used smokeless tobacco. He reports that he does not drink alcohol or use drugs.   Family History: The patient's family history includes Alcohol abuse in his sister; COPD in his mother; Colon cancer (age of onset: 82) in his brother; Diabetes in his brother; Heart attack in his father; Heart disease in his father; Stroke in his sister.   Review of Systems: Please see the history of present illness.   Otherwise, the review of systems is positive for none.   All other systems are reviewed and negative.   Physical  Exam: VS:  BP (!) 120/54   Pulse (!) 59   Ht 6\' 1"  (1.854 m)   Wt 99.6 kg (219 lb 9.6 oz)   SpO2 92%   BMI 28.97  kg/m  .  BMI Body mass index is 28.97 kg/m.  Wt Readings from Last 3 Encounters:  01/01/16 99.6 kg (219 lb 9.6 oz)  12/25/15 102.5 kg (226 lb)  12/09/15 98.4 kg (217 lb)   BP by me is 138/70  General: Pleasant. Elderly male who is hard of hearing but alert and in no acute distress.   HEENT: Normal.  Neck: Supple, no JVD, carotid bruits, or masses noted.  Cardiac: Regular rate and rhythm. Harsh outflow murmur. No edema.  Respiratory:  Lungs are clear to auscultation bilaterally with normal work of breathing.  GI: Soft and nontender.  MS: No deformity or atrophy. Gait and ROM intact.  Skin: Warm and dry. Color is normal.  Neuro:  Strength and sensation are intact and no gross focal deficits noted.  Psych: Alert, appropriate and with normal affect.   LABORATORY DATA:  EKG:   08/25/15 SR rate 66 inferior lateral T wave inversions   Lab Results  Component Value Date   WBC 8.4 08/25/2015   HGB 11.8 (L) 08/25/2015   HCT 36.0 (L) 08/25/2015   PLT 243 08/25/2015   GLUCOSE 188 (H) 08/25/2015   CHOL 145 08/14/2015   TRIG 148 08/14/2015   HDL 27 (L) 08/14/2015   LDLCALC 88 08/14/2015   ALT 14 03/17/2015   AST 20 03/17/2015   NA 140 08/25/2015   K 4.9 08/25/2015   CL 101 08/25/2015   CREATININE 1.89 (H) 08/25/2015   BUN 24 08/25/2015   CO2 29 08/25/2015   TSH 2.51 08/17/2011   PSA 1.83 02/17/2009   INR 1.12 08/13/2015   HGBA1C 7.3 (H) 10/06/2015   MICROALBUR 0.5 02/04/2009    BNP (last 3 results)  Recent Labs  08/13/15 1829  BNP 318.4*    ProBNP (last 3 results) No results for input(s): PROBNP in the last 8760 hours.   Other Studies Reviewed Today:  Echo Study Conclusions from 08/2015  - Left ventricle: The cavity size was normal. Wall thickness was  increased in a pattern of mild LVH. Systolic function was normal.  The estimated ejection  fraction was in the range of 55% to 60%.  There is hypokinesis of the inferolateral myocardium. Doppler  parameters are consistent with abnormal left ventricular  relaxation (grade 1 diastolic dysfunction). Doppler parameters  are consistent with high ventricular filling pressure. - Aortic valve: Valve mobility was restricted. There was mild  stenosis. There was mild regurgitation. Valve area (VTI): 1.87  cm^2. Valve area (Vmax): 2.08 cm^2. Valve area (Vmean): 1.9 cm^2. - Mitral valve: Calcified annulus. Mildly thickened leaflets .  There was mild regurgitation. - Left atrium: The atrium was mildly dilated. - Pulmonary arteries: Systolic pressure was mildly increased. PA  peak pressure: 36 mm Hg (S).  Impressions:  - Hypokinesis of the inferior lateral wall with overall preserved  LV function; grade 1 diastolic dysfunction with elevated LV  filling pressure; mild LAE; calcified aortic valve with mild AS  and mild AI; mild MR and TR; mildly elevated pulmonary pressure.  Procedures 08/13/15    Left Heart Cath and Cors/Grafts Angiography    Conclusion     Prox RCA lesion, 100% stenosed.  LIMA .  The LIMA to LAD graft is widely patent without stenosis.  Sequential SVG .  The sequential saphenous vein graft to OM1 and OM 2 has critical stenosis within the previously stented segment in the proximal body of the graft.  SVG .  Graft known to be totally occluded, not selectively  injected  Origin lesion, 100% stenosed.  SVG .  The graft is chronically occluded  Prox Graft lesion, 100% stenosed.  Prox LAD lesion, 100% stenosed.  Ost LM lesion, 80% stenosed.  Ost 2nd Mrg to 2nd Mrg lesion, 90% stenosed.  Prox Graft to Mid Graft lesion, before 1st Mrg, 99% stenosed. Post intervention, there is a 0% residual stenosis. The lesion was previously treated with a stent (unknown type).  1. Severe native three-vessel coronary artery disease with severe left  main stenosis, total occlusion of the LAD, total occlusion of the RCA, and severe native left circumflex stenosis  2. Status post aortocoronary bypass surgery with continued patency of the LIMA to LAD and continued patency of the saphenous vein graft to OM with severe stenosis in the proximal body of the graft (in-stent)  3. Chronic total occlusion of the saphenous graft RCA and saphenous vein graft to diagonal  4. Successful PCI of the saphenous vein graft OM with placement of a drug-eluting stent(treatment of in-stent restenosis)  Recommendation: dual antiplatelet therapy with aspirin and Plavix at least 12 months. Consider long-term therapy if tolerated in this patient with previous bypass grafting who has required multiple PCI procedures.         Assessment/Plan: 1. Extensive CAD with recent PCI - committed to DAPT - symptoms now resolved with current therapy. Cath 08/13/2015 DES SVG OM   2. HTN - BP recheck by me looks fine - ok on current regimen  3. HLD - now on high dose statin - would plan on repeating labs in about 3 months  4. Chronic dyspnea - using Oxygen at night - follows with pulmonary as well. Did not Think right heart cath indicated as echo suggests PA pressures only  36 mmHg CT High res done 07/08/15 with bilateral lower lobe plate-like ground glass and bronchiectasis exam is abnormal with ILD like velcro crackles at base Has f/u echo In October to make sure PA pressure estimate is not high   5. Mild AS - would follow. Mean gradient 12 mmHg peak 22 mmHg   7. Anemia - rechecking labs today.   8. CKD - rechecking lab today.         Current medicines are reviewed with the patient today.  The patient does not have concerns regarding medicines other than what has been noted above.  The following changes have been made:  See above.  Labs/ tests ordered today include:    No orders of the defined types were placed in this encounter.    [name]

## 2015-12-31 DIAGNOSIS — M545 Low back pain: Secondary | ICD-10-CM | POA: Diagnosis not present

## 2015-12-31 DIAGNOSIS — M47816 Spondylosis without myelopathy or radiculopathy, lumbar region: Secondary | ICD-10-CM | POA: Diagnosis not present

## 2016-01-01 ENCOUNTER — Encounter: Payer: Self-pay | Admitting: Cardiovascular Disease

## 2016-01-01 ENCOUNTER — Ambulatory Visit (INDEPENDENT_AMBULATORY_CARE_PROVIDER_SITE_OTHER): Payer: Medicare Other | Admitting: Cardiovascular Disease

## 2016-01-01 VITALS — BP 120/54 | HR 59 | Ht 73.0 in | Wt 219.6 lb

## 2016-01-01 DIAGNOSIS — I5032 Chronic diastolic (congestive) heart failure: Secondary | ICD-10-CM

## 2016-01-01 DIAGNOSIS — I259 Chronic ischemic heart disease, unspecified: Secondary | ICD-10-CM

## 2016-01-01 MED ORDER — ISOSORBIDE MONONITRATE ER 30 MG PO TB24: 30.0000 mg | ORAL_TABLET | Freq: Every day | ORAL | 3 refills | Status: DC

## 2016-01-01 NOTE — Patient Instructions (Addendum)
Medication Instructions:  Your physician has recommended you make the following change in your medication:  1- Decrease Imdur 30 mg by mouth daily.  Labwork: NONE  Testing/Procedures: NONE  Follow-Up: Your physician wants you to follow-up in: 6 months with Dr. Johnsie Cancel. You will receive a reminder letter in the mail two months in advance. If you don't receive a letter, please call our office to schedule the follow-up appointment.   If you need a refill on your cardiac medications before your next appointment, please call your pharmacy.

## 2016-01-06 ENCOUNTER — Telehealth: Payer: Self-pay | Admitting: Internal Medicine

## 2016-01-06 ENCOUNTER — Ambulatory Visit (INDEPENDENT_AMBULATORY_CARE_PROVIDER_SITE_OTHER): Payer: Medicare Other | Admitting: Rheumatology

## 2016-01-06 ENCOUNTER — Ambulatory Visit (INDEPENDENT_AMBULATORY_CARE_PROVIDER_SITE_OTHER): Payer: Medicare Other

## 2016-01-06 DIAGNOSIS — M0579 Rheumatoid arthritis with rheumatoid factor of multiple sites without organ or systems involvement: Secondary | ICD-10-CM

## 2016-01-06 DIAGNOSIS — M170 Bilateral primary osteoarthritis of knee: Secondary | ICD-10-CM

## 2016-01-06 DIAGNOSIS — Z23 Encounter for immunization: Secondary | ICD-10-CM | POA: Diagnosis not present

## 2016-01-06 DIAGNOSIS — M19041 Primary osteoarthritis, right hand: Secondary | ICD-10-CM

## 2016-01-06 DIAGNOSIS — J84115 Respiratory bronchiolitis interstitial lung disease: Secondary | ICD-10-CM

## 2016-01-06 DIAGNOSIS — Z79899 Other long term (current) drug therapy: Secondary | ICD-10-CM

## 2016-01-06 NOTE — Telephone Encounter (Signed)
Pt dropped off results from [facility] and bone density script. I labeled and put on cart. Please call with any questions.

## 2016-01-07 NOTE — Telephone Encounter (Signed)
Spoke to pt. Advised him we do not do the BMD testing here. I will put the order back up front. Advised him to take it to an imaging center or call [facility] back and advise them to schedule the appt for him if they want him to have it.

## 2016-01-07 NOTE — Telephone Encounter (Signed)
He can take the script and get the test done at the hospital or an imaging center. We don't do those tests here

## 2016-01-08 ENCOUNTER — Encounter: Payer: Self-pay | Admitting: Internal Medicine

## 2016-01-08 ENCOUNTER — Encounter (INDEPENDENT_AMBULATORY_CARE_PROVIDER_SITE_OTHER): Payer: Medicare Other | Admitting: Physical Medicine and Rehabilitation

## 2016-01-08 DIAGNOSIS — M47816 Spondylosis without myelopathy or radiculopathy, lumbar region: Secondary | ICD-10-CM | POA: Diagnosis not present

## 2016-01-09 ENCOUNTER — Other Ambulatory Visit: Payer: Self-pay | Admitting: Cardiovascular Disease

## 2016-01-09 ENCOUNTER — Telehealth: Payer: Self-pay | Admitting: Internal Medicine

## 2016-01-09 ENCOUNTER — Other Ambulatory Visit: Payer: Self-pay | Admitting: Internal Medicine

## 2016-01-09 MED ORDER — CLOPIDOGREL BISULFATE 75 MG PO TABS: 75.0000 mg | ORAL_TABLET | Freq: Every day | ORAL | 11 refills | Status: DC

## 2016-01-09 MED ORDER — ATORVASTATIN CALCIUM 80 MG PO TABS: 80.0000 mg | ORAL_TABLET | Freq: Every day | ORAL | 11 refills | Status: DC

## 2016-01-09 MED ORDER — ONETOUCH ULTRA 2 W/DEVICE KIT: PACK | 0 refills | Status: DC

## 2016-01-09 NOTE — Telephone Encounter (Signed)
Spoke to [employer]. Advised her I did not have him on Gabapentin anymore.

## 2016-01-09 NOTE — Telephone Encounter (Signed)
Rx sent electronically.  

## 2016-01-09 NOTE — Telephone Encounter (Signed)
Bea called to get clarification on the pts Gabapentin RX.  She said he presented with a refill for 3 capsules in the AM and 4-5 capsules in the PM but it says there are zero refills.  She wants to confirm there are no refills on this med.  Can you please contact her [phone]

## 2016-01-09 NOTE — Addendum Note (Signed)
Addended by: Derl Barrow on: 01/09/2016 12:29 PM   Modules accepted: Orders

## 2016-01-13 ENCOUNTER — Other Ambulatory Visit: Payer: Self-pay

## 2016-01-13 ENCOUNTER — Ambulatory Visit (HOSPITAL_COMMUNITY): Payer: Medicare Other | Attending: Cardiovascular Disease

## 2016-01-13 DIAGNOSIS — I5030 Unspecified diastolic (congestive) heart failure: Secondary | ICD-10-CM | POA: Insufficient documentation

## 2016-01-13 DIAGNOSIS — I350 Nonrheumatic aortic (valve) stenosis: Secondary | ICD-10-CM | POA: Diagnosis not present

## 2016-01-14 DIAGNOSIS — Z13820 Encounter for screening for osteoporosis: Secondary | ICD-10-CM | POA: Diagnosis not present

## 2016-01-14 DIAGNOSIS — M85851 Other specified disorders of bone density and structure, right thigh: Secondary | ICD-10-CM | POA: Diagnosis not present

## 2016-01-16 DIAGNOSIS — R0689 Other abnormalities of breathing: Secondary | ICD-10-CM | POA: Diagnosis not present

## 2016-01-16 DIAGNOSIS — J8410 Pulmonary fibrosis, unspecified: Secondary | ICD-10-CM | POA: Diagnosis not present

## 2016-01-20 DIAGNOSIS — H353213 Exudative age-related macular degeneration, right eye, with inactive scar: Secondary | ICD-10-CM | POA: Diagnosis not present

## 2016-01-20 DIAGNOSIS — H35372 Puckering of macula, left eye: Secondary | ICD-10-CM | POA: Diagnosis not present

## 2016-01-20 DIAGNOSIS — H353123 Nonexudative age-related macular degeneration, left eye, advanced atrophic without subfoveal involvement: Secondary | ICD-10-CM | POA: Diagnosis not present

## 2016-01-20 DIAGNOSIS — H43813 Vitreous degeneration, bilateral: Secondary | ICD-10-CM | POA: Diagnosis not present

## 2016-01-26 ENCOUNTER — Telehealth: Payer: Self-pay | Admitting: Neurology

## 2016-01-26 DIAGNOSIS — N183 Chronic kidney disease, stage 3 (moderate): Secondary | ICD-10-CM | POA: Diagnosis not present

## 2016-01-26 DIAGNOSIS — N400 Enlarged prostate without lower urinary tract symptoms: Secondary | ICD-10-CM | POA: Diagnosis not present

## 2016-01-26 NOTE — Telephone Encounter (Signed)
[name] 2031/10/22. His # [phone]. He has been on Lyrica for about 30 days.He said he does not feel that it is helping. He is having sharp pains in his feet and Numbness. Thank you

## 2016-01-27 ENCOUNTER — Telehealth: Payer: Self-pay | Admitting: Radiology

## 2016-01-27 ENCOUNTER — Other Ambulatory Visit: Payer: Self-pay | Admitting: *Deleted

## 2016-01-27 MED ORDER — PREGABALIN 100 MG PO CAPS: 100.0000 mg | ORAL_CAPSULE | Freq: Three times a day (TID) | ORAL | 3 refills | Status: DC

## 2016-01-27 NOTE — Telephone Encounter (Signed)
Received note from [facility] today patient has stage 3 CKD< stable, he will follow up there in one yr with Dr Florene Glen, note sent for scanning

## 2016-01-27 NOTE — Telephone Encounter (Signed)
Please advise 

## 2016-01-27 NOTE — Telephone Encounter (Signed)
Please find out the dose of Lyrica, if he is taking Lyrica 100mg  BID, increase to Lyrica 100mg  TID.  We still have room to go up on the dose.  Donika K. Posey Pronto, DO

## 2016-01-27 NOTE — Telephone Encounter (Signed)
Patient agreed to increase to tid.  Will fax Rx to pharmacy.

## 2016-01-30 ENCOUNTER — Telehealth (INDEPENDENT_AMBULATORY_CARE_PROVIDER_SITE_OTHER): Payer: Self-pay | Admitting: Physical Medicine and Rehabilitation

## 2016-02-02 NOTE — Telephone Encounter (Signed)
Patient is scheduled for 02/05/16 at 0915.

## 2016-02-02 NOTE — Telephone Encounter (Signed)
We can ov to talk but would be question of medications, PT etc

## 2016-02-05 ENCOUNTER — Ambulatory Visit (INDEPENDENT_AMBULATORY_CARE_PROVIDER_SITE_OTHER): Payer: Medicare Other | Admitting: Physical Medicine and Rehabilitation

## 2016-02-05 ENCOUNTER — Encounter (INDEPENDENT_AMBULATORY_CARE_PROVIDER_SITE_OTHER): Payer: Self-pay | Admitting: Physical Medicine and Rehabilitation

## 2016-02-05 VITALS — BP 111/61 | HR 76

## 2016-02-05 DIAGNOSIS — G8929 Other chronic pain: Secondary | ICD-10-CM | POA: Diagnosis not present

## 2016-02-05 DIAGNOSIS — M545 Low back pain, unspecified: Secondary | ICD-10-CM

## 2016-02-05 DIAGNOSIS — M47816 Spondylosis without myelopathy or radiculopathy, lumbar region: Secondary | ICD-10-CM | POA: Diagnosis not present

## 2016-02-05 DIAGNOSIS — M48061 Spinal stenosis, lumbar region without neurogenic claudication: Secondary | ICD-10-CM

## 2016-02-05 NOTE — Progress Notes (Signed)
Office Visit Note   Patient: [name]           Date of Birth: [birth year]           MRN: [id number] Visit Date: 02/05/2016              Requested by: [name], MD [place], [place] [id number] PCP: Viviana Simpler, MD   Assessment & Plan: Visit Diagnoses:  1. Spondylosis without myelopathy or radiculopathy, lumbar region   2. Spinal stenosis of lumbar region without neurogenic claudication   3. Chronic bilateral low back pain without sciatica    Chronic long-term back pain without much referral pattern and now it's really mainly in the right paraspinal quadratus lumborum region. Is worse with standing and ambulating. He does not get any radicular complaints. I think this is a lot of myofascial pain and postural pain with poor core strengthening. He's had physical therapy but he has a lot of other issues with rheumatoid arthritis. At this point I think he would do well with a lumbar corset. I did provide a prescription to [facility] for a lumbar corset. I also want to update his MRI is been seems to thousand 13 and he did miss anything localized in that area and also look for any worsening stenosis. Plan: Plan as above  Follow-Up Instructions: Return for MRI review after completion.   Orders:  Orders Placed This Encounter  Procedures  . MR LUMBAR SPINE WO CONTRAST   No orders of the defined types were placed in this encounter.     Procedures: No procedures performed   Clinical Data: No additional findings.   Subjective: Chief Complaint  Patient presents with  . Lower Back - Pain    Back Pain  This is a chronic problem. The pain is present in the lumbar spine. The pain is at a severity of 8/10. Pertinent negatives include no abdominal pain, chest pain, fever or headaches.  RFA procedures helped a little for about a week. Pain is back to where it was before. The right side is worse than the left. [name] very pleasant 82 year old gentleman  that really is having more musculoskeletal pain on the right side. It's more myofascial. He does have facet arthropathy and the radiofrequency ablation has helped actually quite a bit in terms of that pain but not this one focal area of the muscle. We had completed a trigger point injection a while back with some relief but just not a great deal of relief. He's been to physical therapy but not recently for dry needling and that is thought. He is also not had an MRI in about 4 years and we wonder if there is any worsening stenosis here. He does not wear any sort of lumbar corset has not had one. He does hurt more when he ambulates and walks and has some relief at rest. No radicular pain and no focal weakness.  Review of Systems  Constitutional: Negative for chills, fatigue, fever and unexpected weight change.  HENT: Negative for sore throat and trouble swallowing.   Eyes: Negative for photophobia and visual disturbance.  Respiratory: Negative for chest tightness and shortness of breath.   Cardiovascular: Negative for chest pain.  Gastrointestinal: Negative for abdominal pain.  Endocrine: Negative for cold intolerance and heat intolerance.  Musculoskeletal: Positive for back pain. Negative for myalgias.  Skin: Negative for color change and rash.  Neurological: Negative for speech difficulty and headaches.  Psychiatric/Behavioral: Negative for  confusion. The patient is not nervous/anxious.    Objective: Vital Signs: BP 111/61   Pulse 76   Physical Exam  Constitutional: He appears well-developed and well-nourished. No distress.  Eyes: Conjunctivae are normal. Pupils are equal, round, and reactive to light.  Cardiovascular: Regular rhythm and intact distal pulses.   Pulmonary/Chest: Effort normal and breath sounds normal.  Skin: Skin is warm.  Psychiatric: He has a normal mood and affect.   The patient's for a slow to rise from a seated position and stands with a forward flexed spine. He has  pain over the right iliac crest and paraspinal region relieve the quadratus lumborum. There is a focal trigger point here. This is in the area of most of his pain. He does have pain with extension rotation LESS than before. He has no pain over the greater trochanters and no pain with hip rotation. Good distal strength without deficits. No clonus.   Ortho Exam  Specialty Comments:  No specialty comments available.  Imaging: No results found.   PMFS History: Patient Active Problem List   Diagnosis Date Noted  . NSTEMI (non-ST elevated myocardial infarction) ([place]) 08/13/2015  . Respiratory failure, chronic ([place]) 11/21/2014  . DM (diabetes mellitus) type II controlled, neurological manifestation ([place])   . Coronary artery disease with unspecified angina pectoris   . Ventral hernia 12/17/2013  . Seropositive rheumatoid arthritis ([place])   . Routine general medical examination at a health care facility 08/29/2012  . CKD stage 3 due to type 2 diabetes mellitus ([place])   . ILD (interstitial lung disease) ([place]) 11/28/2011  . Chronic diastolic heart failure ([place]) 09/14/2011  . Diabetes, polyneuropathy ([place]) 08/10/2010  . Obstructive sleep apnea 12/17/2008  . ESOPHAGEAL STRICTURE 10/09/2008  . Reflux esophagitis 09/10/2008  . DIVERTICULOSIS-COLON 09/10/2008  . Aortic sclerosis ([place]) 03/19/2008  . ACTINIC KERATOSIS 10/23/2007  . SLEEP DISORDER, CHRONIC 10/17/2006  . HLD (hyperlipidemia) 09/23/2006  . Essential hypertension 09/23/2006  . GERD 09/23/2006  . BENIGN PROSTATIC HYPERTROPHY 09/23/2006   Past Medical History:  Diagnosis Date  . Arthritis    osteoarthritis, s/p R TKR, and digits  . CAD (coronary artery disease)    a. s/p CABG (2001)  b. s/p DES to RCA and cutting POBA to ostial PDA (2013)   c. s/p DES to SVG to OM2 (01/14/14) d. cath: 08/2015 NSTEMI w/ patent LIMA-LAD and 99% stenosis of SVG-OM w/ DES placed. CTO of SVG-RCA and SVG-D1.   [name] Chronic diastolic CHF (congestive heart  failure) ([place])    a) 09/13 ECHO- LVEF 94-49%, grade 1 diastolic dysfunction, mild LA dilatation, atrial septal aneurysm, AV mobility restricted, but no sig AS by doppler; b) 09/04/08 ECHO- LVH, ef 60%, mild AS, c. echo 08/2015: EF perserved of 55-60% with inferolateral HK. Mild AS noted.  . Chronic kidney disease, stage III (moderate)   . Chronic lower back pain   . Colon polyps   . Diverticulosis   . Dyspnea 2009 since July -Sept   05/06/08-[facility]-  normal effort, reduced VO2 max 20.5 /65%, reduced at 8.2/ 40%, normal breathing resetvca of 55%, submaximal heart rate response 112/77%, flattened o2 pluse response at peak exercise-12 ml/beat @ 85%, No VQ mismatch abnormalities, All c/w CIRC Limitation  . Enlarged prostate   . Esophageal stricture    a. s/p dilation spring 2010  . GERD (gastroesophageal reflux disease)   . Heart murmur   . Hiatal hernia   . History of PFTs    mixed pattern on spiro. mild  restn on lung volumes with near normal DLCO. Pattern can be explained by CABG scar. Fev1 2.2L/73%, ratio 68 (67), TLC 4.7/68%,RV 1.5L/55%,DLCO 79%  . Hyperlipidemia   . Hypertension   . Interstitial lung disease ([name])    NOS  . Overweight (BMI 25.0-29.9)    BMI 29  . RA (rheumatoid arthritis) ([name])    [name]  . Seropositive rheumatoid arthritis ([place])   . Type II diabetes mellitus ([name])     Family History  Problem Relation Age of Onset  . COPD Mother   . Heart disease Father   . Heart attack Father   . Diabetes Brother   . Colon cancer Brother 25  . Alcohol abuse Sister   . Stroke Sister     Past Surgical History:  Procedure Laterality Date  . CARDIAC CATHETERIZATION  08/2004   CP- no MI, Cath- small vessell disease   . CARDIAC CATHETERIZATION  12/31/2011   80% distal LM, 100% native LAD, LCx and RCA, 30% prox SVG-OM, SVG-D1 normal, 99% distal, 80% ostial SVG-RCA distal to graft, LIMA-LAD normal; LVEF mildly decreased with posterior basal AK   . CARDIAC CATHETERIZATION  2009    with patent grafts/notes 12/31/2011  . CARDIAC CATHETERIZATION N/A 08/13/2015   Procedure: Left Heart Cath and Cors/Grafts Angiography;  Surgeon: Sherren Mocha, MD;  Location: [facility];  Service: Cardiovascular;  Laterality: N/A;  . CATARACT EXTRACTION W/ INTRAOCULAR LENS  IMPLANT, BILATERAL Bilateral   . CHOLECYSTECTOMY OPEN  11/2003   [name]  . CORONARY ANGIOPLASTY WITH STENT PLACEMENT  01/03/2012   Successful DES to SVG-RCA and cutting balloon angioplasty ostial  PDA   . CORONARY ANGIOPLASTY WITH STENT PLACEMENT  01/14/2014   "1"  . CORONARY ARTERY BYPASS GRAFT  11/1999   CABG X5  . CORONARY STENT PLACEMENT  02/2012   1 stent and balloon  . ESOPHAGOGASTRODUODENOSCOPY (EGD) WITH ESOPHAGEAL DILATION  2010  . HAND SURGERY    . JOINT REPLACEMENT    . KNEE ARTHROSCOPY Right 2008  . LEFT AND RIGHT HEART CATHETERIZATION WITH CORONARY ANGIOGRAM  12/31/2011   Procedure: LEFT AND RIGHT HEART CATHETERIZATION WITH CORONARY ANGIOGRAM;  Surgeon: Burnell Blanks, MD;  Location: [facility] CATH LAB;  Service: Cardiovascular;;  . LEFT AND RIGHT HEART CATHETERIZATION WITH CORONARY ANGIOGRAM N/A 01/14/2014   Procedure: LEFT AND RIGHT HEART CATHETERIZATION WITH CORONARY ANGIOGRAM;  Surgeon: Peter M Martinique, MD;  Location: [facility] CATH LAB;  Service: Cardiovascular;  Laterality: N/A;  . PERCUTANEOUS CORONARY INTERVENTION-BALLOON ONLY  01/03/2012   Procedure: PERCUTANEOUS CORONARY INTERVENTION-BALLOON ONLY;  Surgeon: Peter M Martinique, MD;  Location: [facility] CATH LAB;  Service: Cardiovascular;;  . PERCUTANEOUS CORONARY STENT INTERVENTION (PCI-S)  12/31/2011   Procedure: PERCUTANEOUS CORONARY STENT INTERVENTION (PCI-S);  Surgeon: Burnell Blanks, MD;  Location: [facility] CATH LAB;  Service: Cardiovascular;;  . PERCUTANEOUS CORONARY STENT INTERVENTION (PCI-S) N/A 01/03/2012   Procedure: PERCUTANEOUS CORONARY STENT INTERVENTION (PCI-S);  Surgeon: Peter M Martinique, MD;  Location: [facility] CATH LAB;  Service: Cardiovascular;   Laterality: N/A;  . SHOULDER ARTHROSCOPY WITH OPEN ROTATOR CUFF REPAIR AND DISTAL CLAVICLE ACROMINECTOMY Left 02/27/2013   Procedure: LEFT SHOULDER ARTHROSCOPY WITH MINI OPEN ROTATOR CUFF REPAIR AND SUBACROMIAL DECOMPRESSION AND DISTAL CLAVICLE RESECTION;  Surgeon: [name], MD;  Location: [facility];  Service: Orthopedics;  Laterality: Left;  . TOTAL KNEE ARTHROPLASTY Right 03/2010   [name]  . TRIGGER FINGER RELEASE Left 02/27/2013   Procedure: RELEASE TRIGGER FINGER/A-1 PULLEY;  Surgeon: [name], MD;  Location: [facility];  Service: Orthopedics;  Laterality: Left;   Social History   Occupational History  . Conservation officer, [employer] Retired    retired   Social History Main Topics  . Smoking status: Former Smoker    Packs/day: 1.00    Years: 20.00    Types: Cigarettes    Quit date: 04/06/1963  . Smokeless tobacco: Never Used  . Alcohol use No     Comment: 01/01/2012 "last alcohol ~ 50 yr ago"  . Drug use: No  . Sexual activity: Not Currently

## 2016-02-05 NOTE — Patient Instructions (Addendum)
Script given for [employer] to provide lumbar corset   Back Pain, Adult Back pain is very common in adults.The cause of back pain is rarely dangerous and the pain often gets better over time.The cause of your back pain may not be known. Some common causes of back pain include:  Strain of the muscles or ligaments supporting the spine.  Wear and tear (degeneration) of the spinal disks.  Arthritis.  Direct injury to the back. For many people, back pain may return. Since back pain is rarely dangerous, most people can learn to manage this condition on their own. HOME CARE INSTRUCTIONS Watch your back pain for any changes. The following actions may help to lessen any discomfort you are feeling:  Remain active. It is stressful on your back to sit or stand in one place for long periods of time. Do not sit, drive, or stand in one place for more than 30 minutes at a time. Take short walks on even surfaces as soon as you are able.Try to increase the length of time you walk each day.  Exercise regularly as directed by your health care provider. Exercise helps your back heal faster. It also helps avoid future injury by keeping your muscles strong and flexible.  Do not stay in bed.Resting more than 1-2 days can delay your recovery.  Pay attention to your body when you bend and lift. The most comfortable positions are those that put less stress on your recovering back. Always use proper lifting techniques, including:  Bending your knees.  Keeping the load close to your body.  Avoiding twisting.  Find a comfortable position to sleep. Use a firm mattress and lie on your side with your knees slightly bent. If you lie on your back, put a pillow under your knees.  Avoid feeling anxious or stressed.Stress increases muscle tension and can worsen back pain.It is important to recognize when you are anxious or stressed and learn ways to manage it, such as with exercise.  Take medicines only as directed  by your health care provider. Over-the-counter medicines to reduce pain and inflammation are often the most helpful.Your health care provider may prescribe muscle relaxant drugs.These medicines help dull your pain so you can more quickly return to your normal activities and healthy exercise.  Apply ice to the injured area:  Put ice in a plastic bag.  Place a towel between your skin and the bag.  Leave the ice on for 20 minutes, 2-3 times a day for the first 2-3 days. After that, ice and heat may be alternated to reduce pain and spasms.  Maintain a healthy weight. Excess weight puts extra stress on your back and makes it difficult to maintain good posture. SEEK MEDICAL CARE IF:  You have pain that is not relieved with rest or medicine.  You have increasing pain going down into the legs or buttocks.  You have pain that does not improve in one week.  You have night pain.  You lose weight.  You have a fever or chills. SEEK IMMEDIATE MEDICAL CARE IF:   You develop new bowel or bladder control problems.  You have unusual weakness or numbness in your arms or legs.  You develop nausea or vomiting.  You develop abdominal pain.  You feel faint.   This information is not intended to replace advice given to you by your health care provider. Make sure you discuss any questions you have with your health care provider.   Document Released: 03/22/2005 Document Revised:  04/12/2014 Document Reviewed: 07/24/2013 Elsevier Interactive Patient Education [employer].

## 2016-02-11 ENCOUNTER — Telehealth: Payer: Self-pay

## 2016-02-11 NOTE — Telephone Encounter (Signed)
Message relayed to patient. Verbalized understanding and denied questions. Will call back in 1 week with update.

## 2016-02-11 NOTE — Telephone Encounter (Signed)
I'm sorry to hear this - swelling can be a side effect of the Lyrica.  Please have him reduce the dose to Lyrica 100mg  daily until his swelling has improved. Call with update in 1 week - if swelling is better, we can increase is slowly with 75mg  twice daily.    Donika K. Posey Pronto, DO

## 2016-02-11 NOTE — Telephone Encounter (Signed)
Pt called to report that he is having swelling since starting Lyrica. Especially in his feet. He cannot get his shoes on. Please advise.   [phone]

## 2016-02-16 DIAGNOSIS — J8410 Pulmonary fibrosis, unspecified: Secondary | ICD-10-CM | POA: Diagnosis not present

## 2016-02-16 DIAGNOSIS — R0689 Other abnormalities of breathing: Secondary | ICD-10-CM | POA: Diagnosis not present

## 2016-02-18 ENCOUNTER — Other Ambulatory Visit: Payer: Self-pay | Admitting: Rheumatology

## 2016-02-18 DIAGNOSIS — L570 Actinic keratosis: Secondary | ICD-10-CM | POA: Diagnosis not present

## 2016-02-18 DIAGNOSIS — Z79899 Other long term (current) drug therapy: Secondary | ICD-10-CM | POA: Diagnosis not present

## 2016-02-18 DIAGNOSIS — L821 Other seborrheic keratosis: Secondary | ICD-10-CM | POA: Diagnosis not present

## 2016-02-18 DIAGNOSIS — D225 Melanocytic nevi of trunk: Secondary | ICD-10-CM | POA: Diagnosis not present

## 2016-02-18 DIAGNOSIS — L820 Inflamed seborrheic keratosis: Secondary | ICD-10-CM | POA: Diagnosis not present

## 2016-02-18 DIAGNOSIS — D1801 Hemangioma of skin and subcutaneous tissue: Secondary | ICD-10-CM | POA: Diagnosis not present

## 2016-02-18 LAB — COMPLETE METABOLIC PANEL WITH GFR
ALT: 8 U/L — AB (ref 9–46)
AST: 16 U/L (ref 10–35)
Albumin: 4 g/dL (ref 3.6–5.1)
Alkaline Phosphatase: 60 U/L (ref 40–115)
BILIRUBIN TOTAL: 0.5 mg/dL (ref 0.2–1.2)
BUN: 27 mg/dL — ABNORMAL HIGH (ref 7–25)
CALCIUM: 9.5 mg/dL (ref 8.6–10.3)
CO2: 31 mmol/L (ref 20–31)
CREATININE: 1.84 mg/dL — AB (ref 0.70–1.11)
Chloride: 102 mmol/L (ref 98–110)
GFR, EST AFRICAN AMERICAN: 39 mL/min — AB (ref >=60)
GFR, Est Non African American: 33 mL/min — ABNORMAL LOW (ref >=60)
Glucose, Bld: 107 mg/dL — ABNORMAL HIGH (ref 65–99)
Potassium: 4.3 mmol/L (ref 3.5–5.3)
Sodium: 143 mmol/L (ref 135–146)
TOTAL PROTEIN: 7.4 g/dL (ref 6.1–8.1)

## 2016-02-18 LAB — CBC WITH DIFFERENTIAL/PLATELET
BASOS PCT: 1 %
Basophils Absolute: 82 cells/uL (ref 0–200)
EOS ABS: 328 {cells}/uL (ref 15–500)
Eosinophils Relative: 4 %
HCT: 32.8 % — ABNORMAL LOW (ref 38.5–50.0)
Hemoglobin: 10.9 g/dL — ABNORMAL LOW (ref 13.2–17.1)
LYMPHS PCT: 22 %
Lymphs Abs: 1804 cells/uL (ref 850–3900)
MCH: 28.2 pg (ref 27.0–33.0)
MCHC: 33.2 g/dL (ref 32.0–36.0)
MCV: 84.8 fL (ref 80.0–100.0)
MONOS PCT: 8 %
MPV: 10.3 fL (ref 7.5–12.5)
Monocytes Absolute: 656 cells/uL (ref 200–950)
Neutro Abs: 5330 cells/uL (ref 1500–7800)
Neutrophils Relative %: 65 %
PLATELETS: 235 10*3/uL (ref 140–400)
RBC: 3.87 MIL/uL — AB (ref 4.20–5.80)
RDW: 16.1 % — AB (ref 11.0–15.0)
WBC: 8.2 10*3/uL (ref 3.8–10.8)

## 2016-02-19 ENCOUNTER — Telehealth: Payer: Self-pay | Admitting: Radiology

## 2016-02-19 NOTE — Telephone Encounter (Signed)
Called patient no answer.

## 2016-02-19 NOTE — Telephone Encounter (Signed)
I will call patient to advise labs are c/w previous

## 2016-02-20 ENCOUNTER — Telehealth: Payer: Self-pay | Admitting: Neurology

## 2016-02-20 MED ORDER — LIDOCAINE 5 % EX OINT: 1.0000 "application " | TOPICAL_OINTMENT | Freq: Two times a day (BID) | CUTANEOUS | 5 refills | Status: DC | PRN

## 2016-02-20 NOTE — Telephone Encounter (Signed)
Patient taken off Lyrica last week. He said that the swelling is gone but his feet still hurt.  He has tried lidocaine in the past but said that it did not help much then.  He may try it again.  Any other suggestions?

## 2016-02-20 NOTE — Telephone Encounter (Signed)
Patient needs to talk to someone about giving a update on how he is doing please call [phone]

## 2016-02-20 NOTE — Telephone Encounter (Signed)
He can try alpha-lipoic acid 400-600mg  daily - it's an OTC supplement.  Lidocaine ointment will be resent for him to try.  Donika K. Posey Pronto, DO

## 2016-02-25 ENCOUNTER — Inpatient Hospital Stay: Admission: RE | Admit: 2016-02-25 | Payer: Medicare Other | Source: Ambulatory Visit

## 2016-03-08 ENCOUNTER — Telehealth: Payer: Self-pay | Admitting: Neurology

## 2016-03-08 NOTE — Telephone Encounter (Signed)
Patient wanted to know if he should stop the lyrica.  Instructed him not to take it if he thinks it is causing the swelling but if not he can take it.

## 2016-03-08 NOTE — Telephone Encounter (Signed)
PT left a message that he wanted a call back from Dr Serita Grit nurse/Dawn CB# [phone]

## 2016-03-12 ENCOUNTER — Ambulatory Visit
Admission: RE | Admit: 2016-03-12 | Discharge: 2016-03-12 | Disposition: A | Discharge status: Home / Self Care | Payer: Medicare Other | Source: Ambulatory Visit | Attending: Physical Medicine and Rehabilitation | Admitting: Physical Medicine and Rehabilitation

## 2016-03-12 DIAGNOSIS — M545 Low back pain, unspecified: Secondary | ICD-10-CM

## 2016-03-12 DIAGNOSIS — M5126 Other intervertebral disc displacement, lumbar region: Secondary | ICD-10-CM | POA: Diagnosis not present

## 2016-03-12 DIAGNOSIS — M48061 Spinal stenosis, lumbar region without neurogenic claudication: Secondary | ICD-10-CM

## 2016-03-12 DIAGNOSIS — M47816 Spondylosis without myelopathy or radiculopathy, lumbar region: Secondary | ICD-10-CM

## 2016-03-12 DIAGNOSIS — G8929 Other chronic pain: Secondary | ICD-10-CM

## 2016-03-17 DIAGNOSIS — J8410 Pulmonary fibrosis, unspecified: Secondary | ICD-10-CM | POA: Diagnosis not present

## 2016-03-17 DIAGNOSIS — R0689 Other abnormalities of breathing: Secondary | ICD-10-CM | POA: Diagnosis not present

## 2016-03-23 ENCOUNTER — Ambulatory Visit (INDEPENDENT_AMBULATORY_CARE_PROVIDER_SITE_OTHER): Payer: Medicare Other | Admitting: Physical Medicine and Rehabilitation

## 2016-03-23 ENCOUNTER — Encounter (INDEPENDENT_AMBULATORY_CARE_PROVIDER_SITE_OTHER): Payer: Self-pay | Admitting: Physical Medicine and Rehabilitation

## 2016-03-23 VITALS — BP 143/70 | HR 60

## 2016-03-23 DIAGNOSIS — M47816 Spondylosis without myelopathy or radiculopathy, lumbar region: Secondary | ICD-10-CM | POA: Diagnosis not present

## 2016-03-23 DIAGNOSIS — M48061 Spinal stenosis, lumbar region without neurogenic claudication: Secondary | ICD-10-CM | POA: Insufficient documentation

## 2016-03-23 NOTE — Progress Notes (Signed)
GRIGOR LIPSCHUTZ - 82 y.o. male MRN [id number]  Date of birth: 1931-12-18  Office Visit Note: Visit Date: 03/23/2016 PCP: Viviana Simpler, MD Referred by: [name], MD  Subjective: Chief Complaint  Patient presents with  . Lower Back - Pain   HPI: Mr. Macomber is a very pleasant 82 year old gentleman that I have seen over the years for lumbar stenosis and low back pain with facet joint arthropathy. Patient is here today for MRI review. Has been wearing back brace which he says has helped a lot. "pain about a 3." He has periods where his back pain will flareup. He has not had any periods recently a radiculitis radiculopathy. He had a prior MRI in 2013 but with increasing pain recently. He has not really benefited from injection as much. We have completed radiofrequency ablation and now with a back brace he is actually doing somewhat better. We did update his MRI due to the fact that he did have some stenosis of the MRI was going on 82 years old at that point. The MRI is reviewed below but fortunately does not show really increasing stenosis but he does have prominent stenosis at L4-5. He has a transitional segment. He does have multilevel facet arthropathy. His disks actually looked fairly good. Overall his case is complicated by rheumatoid arthritis as well as interstitial lung disease and diabetes. He states overall he is doing okay at this point. He is able to still work with his back brace on he is not having radicular complaints or focal weakness. He says really his biggest problem at this point is his lungs.    Review of Systems  Constitutional: Negative for chills, fever, malaise/fatigue and weight loss.  HENT: Negative for hearing loss and sinus pain.   Eyes: Negative for blurred vision, double vision and photophobia.  Respiratory: Positive for shortness of breath. Negative for cough.   Cardiovascular: Negative for chest pain, palpitations and leg swelling.  Gastrointestinal:  Negative for abdominal pain, nausea and vomiting.  Genitourinary: Negative for flank pain.  Musculoskeletal: Positive for back pain and joint pain. Negative for myalgias.  Skin: Negative for itching and rash.  Neurological: Negative for tremors, focal weakness and weakness.  Endo/Heme/Allergies: Negative.   Psychiatric/Behavioral: Negative for depression.  All other systems reviewed and are negative.  Otherwise per HPI.  Assessment & Plan: Visit Diagnoses:  1. Spinal stenosis of lumbar region without neurogenic claudication   2. Spondylosis without myelopathy or radiculopathy, lumbar region     Plan: Findings:  Chronic history of exacerbations and intermittent low back pain history in the past and more radicular complaints. He's done fairly well with radiofrequency ablation and lumbar corset. He does have facet arthropathy. MRI findings show stable stenosis at L5-S1 which would be an L4-5 in most people would he does have a transitional segment. The last lumbar body was designated as an S1 vertebral body which would be lumbar I's test. Nonetheless he is doing okay at this point we will maintain the current medication regimen and the back brace that he uses intermittently. If his symptoms were to start to return or become more radicular probably would try an epidural injection. We have not completed an epidural injection sometime as we have felt this to be more facet mediated pain. Again is No radicular symptoms or focal weakness. His lung diseases problematic he is on oxygen at night and this seems to be slowly progressive.    Meds & Orders: No orders of the defined  types were placed in this encounter.  No orders of the defined types were placed in this encounter.   Follow-up: Return if symptoms worsen or fail to improve.   Procedures: No procedures performed  No notes on file   Clinical History: Lumbar spine 03/12/2016 MRI  IMPRESSION:  L5-S1: Prominent central narrowing of the thecal  sac with mild bilateral foraminal stenosis and mild left and borderline right subarticular lateral recess stenosis due to short pedicles, disc bulge, and facet arthropathy. Cross-sectional area of the thecal sac 0.6 cm^ 2, formerly 0.8 cm^2.   1. A transitional lumbosacral vertebra is assumed to represent the S1 level. Careful correlation with this numbering strategy prior to any procedural intervention would be recommended. 2. Lumbar spondylosis and degenerative disc disease along with congenitally short pedicles, causing prominent impingement at L5-S1; moderate impingement at L4-5; and mild impingement at L3-4 and S1-2, as detailed above. The findings at L5-S1 have worsened in the interval. 3. Stable mildly low-lying conus without tethering mass, although this low position of the conus is partially due to the classification scheme in which S1 is fully segmental.  He reports that he quit smoking about 53 years ago. His smoking use included Cigarettes. He has a 20.00 pack-year smoking history. He has never used smokeless tobacco.   Recent Labs  10/06/15 1246  HGBA1C 7.3*    Objective:  VS:  HT:    WT:   BMI:     BP:(!) 143/70  HR:60bpm  TEMP: ( )  RESP:  Physical Exam  Constitutional: He is oriented to person, place, and time. He appears well-developed and well-nourished. No distress.  HENT:  Head: Normocephalic and atraumatic.  Eyes: Conjunctivae are normal. Pupils are equal, round, and reactive to light.  Cardiovascular: Regular rhythm and intact distal pulses.   Pulmonary/Chest: Effort normal.  He demonstrates no retractions or accessory muscle use or aspiration.  Abdominal: Soft. There is no tenderness.  Musculoskeletal:  Lumbar spine range of motion is significantly limited with extension. He does get pain with extension rotation. No pain over the greater trochanters no pain with hip rotation good distal strength.  Neurological: He is alert and oriented to person,  place, and time.  Skin: Skin is warm.  Psychiatric: He has a normal mood and affect.    Ortho Exam Imaging: No results found.  Past Medical/Family/Surgical/Social History: Medications & Allergies reviewed per EMR Patient Active Problem List   Diagnosis Date Noted  . Spinal stenosis of lumbar region without neurogenic claudication 03/23/2016  . Spondylosis without myelopathy or radiculopathy, lumbar region 03/23/2016  . NSTEMI (non-ST elevated myocardial infarction) ([place]) 08/13/2015  . Respiratory failure, chronic ([place]) 11/21/2014  . DM (diabetes mellitus) type II controlled, neurological manifestation ([place])   . Coronary artery disease with unspecified angina pectoris   . Ventral hernia 12/17/2013  . Seropositive rheumatoid arthritis ([place])   . Routine general medical examination at a health care facility 08/29/2012  . CKD stage 3 due to type 2 diabetes mellitus ([place])   . ILD (interstitial lung disease) ([place]) 11/28/2011  . Chronic diastolic heart failure ([place]) 09/14/2011  . Diabetes, polyneuropathy ([place]) 08/10/2010  . Obstructive sleep apnea 12/17/2008  . ESOPHAGEAL STRICTURE 10/09/2008  . Reflux esophagitis 09/10/2008  . DIVERTICULOSIS-COLON 09/10/2008  . Aortic sclerosis ([place]) 03/19/2008  . ACTINIC KERATOSIS 10/23/2007  . SLEEP DISORDER, CHRONIC 10/17/2006  . HLD (hyperlipidemia) 09/23/2006  . Essential hypertension 09/23/2006  . GERD 09/23/2006  . BENIGN PROSTATIC HYPERTROPHY 09/23/2006   Past Medical History:  Diagnosis Date  . Arthritis    osteoarthritis, s/p R TKR, and digits  . CAD (coronary artery disease)    a. s/p CABG (2001)  b. s/p DES to RCA and cutting POBA to ostial PDA (2013)   c. s/p DES to SVG to OM2 (01/14/14) d. cath: 08/2015 NSTEMI w/ patent LIMA-LAD and 99% stenosis of SVG-OM w/ DES placed. CTO of SVG-RCA and SVG-D1.   [name] Chronic diastolic CHF (congestive heart failure) ([place])    a) 09/13 ECHO- LVEF 79-02%, grade 1 diastolic dysfunction, mild LA  dilatation, atrial septal aneurysm, AV mobility restricted, but no sig AS by doppler; b) 09/04/08 ECHO- LVH, ef 60%, mild AS, c. echo 08/2015: EF perserved of 55-60% with inferolateral HK. Mild AS noted.  . Chronic kidney disease, stage III (moderate)   . Chronic lower back pain   . Colon polyps   . Diverticulosis   . Dyspnea 2009 since July -Sept   05/06/08-CPST-  normal effort, reduced VO2 max 20.5 /65%, reduced at 8.2/ 40%, normal breathing resetvca of 55%, submaximal heart rate response 112/77%, flattened o2 pluse response at peak exercise-12 ml/beat @ 85%, No VQ mismatch abnormalities, All c/w CIRC Limitation  . Enlarged prostate   . Esophageal stricture    a. s/p dilation spring 2010  . GERD (gastroesophageal reflux disease)   . Heart murmur   . Hiatal hernia   . History of PFTs    mixed pattern on spiro. mild restn on lung volumes with near normal DLCO. Pattern can be explained by CABG scar. Fev1 2.2L/73%, ratio 68 (67), TLC 4.7/68%,RV 1.5L/55%,DLCO 79%  . Hyperlipidemia   . Hypertension   . Interstitial lung disease ([name])    NOS  . Overweight (BMI 25.0-29.9)    BMI 29  . RA (rheumatoid arthritis) ([name])    [name]  . Seropositive rheumatoid arthritis ([place])   . Type II diabetes mellitus ([name])    Family History  Problem Relation Age of Onset  . COPD Mother   . Heart disease Father   . Heart attack Father   . Diabetes Brother   . Colon cancer Brother 25  . Alcohol abuse Sister   . Stroke Sister    Past Surgical History:  Procedure Laterality Date  . CARDIAC CATHETERIZATION  08/2004   CP- no MI, Cath- small vessell disease   . CARDIAC CATHETERIZATION  12/31/2011   80% distal LM, 100% native LAD, LCx and RCA, 30% prox SVG-OM, SVG-D1 normal, 99% distal, 80% ostial SVG-RCA distal to graft, LIMA-LAD normal; LVEF mildly decreased with posterior basal AK   . CARDIAC CATHETERIZATION  2009   with patent grafts/notes 12/31/2011  . CARDIAC CATHETERIZATION N/A 08/13/2015    Procedure: Left Heart Cath and Cors/Grafts Angiography;  Surgeon: Sherren Mocha, MD;  Location: [place] [facility];  Service: Cardiovascular;  Laterality: N/A;  . CATARACT EXTRACTION W/ INTRAOCULAR LENS  IMPLANT, BILATERAL Bilateral   . CHOLECYSTECTOMY OPEN  11/2003   [name]  . CORONARY ANGIOPLASTY WITH STENT PLACEMENT  01/03/2012   Successful DES to SVG-RCA and cutting balloon angioplasty ostial  PDA   . CORONARY ANGIOPLASTY WITH STENT PLACEMENT  01/14/2014   "1"  . CORONARY ARTERY BYPASS GRAFT  11/1999   CABG X5  . CORONARY STENT PLACEMENT  02/2012   1 stent and balloon  . ESOPHAGOGASTRODUODENOSCOPY (EGD) WITH ESOPHAGEAL DILATION  2010  . HAND SURGERY    . JOINT REPLACEMENT    . KNEE ARTHROSCOPY Right 2008  . LEFT AND  RIGHT HEART CATHETERIZATION WITH CORONARY ANGIOGRAM  12/31/2011   Procedure: LEFT AND RIGHT HEART CATHETERIZATION WITH CORONARY ANGIOGRAM;  Surgeon: Burnell Blanks, MD;  Location: [facility] CATH LAB;  Service: Cardiovascular;;  . LEFT AND RIGHT HEART CATHETERIZATION WITH CORONARY ANGIOGRAM N/A 01/14/2014   Procedure: LEFT AND RIGHT HEART CATHETERIZATION WITH CORONARY ANGIOGRAM;  Surgeon: Peter M Martinique, MD;  Location: [facility] CATH LAB;  Service: Cardiovascular;  Laterality: N/A;  . PERCUTANEOUS CORONARY INTERVENTION-BALLOON ONLY  01/03/2012   Procedure: PERCUTANEOUS CORONARY INTERVENTION-BALLOON ONLY;  Surgeon: Peter M Martinique, MD;  Location: [facility] CATH LAB;  Service: Cardiovascular;;  . PERCUTANEOUS CORONARY STENT INTERVENTION (PCI-S)  12/31/2011   Procedure: PERCUTANEOUS CORONARY STENT INTERVENTION (PCI-S);  Surgeon: Burnell Blanks, MD;  Location: [facility] CATH LAB;  Service: Cardiovascular;;  . PERCUTANEOUS CORONARY STENT INTERVENTION (PCI-S) N/A 01/03/2012   Procedure: PERCUTANEOUS CORONARY STENT INTERVENTION (PCI-S);  Surgeon: Peter M Martinique, MD;  Location: [facility] CATH LAB;  Service: Cardiovascular;  Laterality: N/A;  . SHOULDER ARTHROSCOPY WITH OPEN ROTATOR CUFF REPAIR AND DISTAL  CLAVICLE ACROMINECTOMY Left 02/27/2013   Procedure: LEFT SHOULDER ARTHROSCOPY WITH MINI OPEN ROTATOR CUFF REPAIR AND SUBACROMIAL DECOMPRESSION AND DISTAL CLAVICLE RESECTION;  Surgeon: [name], MD;  Location: [place];  Service: Orthopedics;  Laterality: Left;  . TOTAL KNEE ARTHROPLASTY Right 03/2010   [name]  . TRIGGER FINGER RELEASE Left 02/27/2013   Procedure: RELEASE TRIGGER FINGER/A-1 PULLEY;  Surgeon: [name], MD;  Location: [place];  Service: Orthopedics;  Laterality: Left;   Social History   Occupational History  . Conservation officer, [employer] Retired    retired   Social History Main Topics  . Smoking status: Former Smoker    Packs/day: 1.00    Years: 20.00    Types: Cigarettes    Quit date: 04/06/1963  . Smokeless tobacco: Never Used  . Alcohol use No     Comment: 01/01/2012 "last alcohol ~ 50 yr ago"  . Drug use: No  . Sexual activity: Not Currently

## 2016-03-24 ENCOUNTER — Encounter (INDEPENDENT_AMBULATORY_CARE_PROVIDER_SITE_OTHER): Payer: Self-pay | Admitting: Physical Medicine and Rehabilitation

## 2016-04-08 ENCOUNTER — Ambulatory Visit (INDEPENDENT_AMBULATORY_CARE_PROVIDER_SITE_OTHER): Payer: Medicare Other | Admitting: Internal Medicine

## 2016-04-08 ENCOUNTER — Encounter: Payer: Self-pay | Admitting: Internal Medicine

## 2016-04-08 VITALS — BP 130/78 | HR 65 | Temp 97.4°F | Wt 216.0 lb

## 2016-04-08 DIAGNOSIS — I5032 Chronic diastolic (congestive) heart failure: Secondary | ICD-10-CM | POA: Diagnosis not present

## 2016-04-08 DIAGNOSIS — I25119 Atherosclerotic heart disease of native coronary artery with unspecified angina pectoris: Secondary | ICD-10-CM | POA: Diagnosis not present

## 2016-04-08 DIAGNOSIS — M059 Rheumatoid arthritis with rheumatoid factor, unspecified: Secondary | ICD-10-CM

## 2016-04-08 DIAGNOSIS — E1122 Type 2 diabetes mellitus with diabetic chronic kidney disease: Secondary | ICD-10-CM

## 2016-04-08 DIAGNOSIS — J849 Interstitial pulmonary disease, unspecified: Secondary | ICD-10-CM | POA: Diagnosis not present

## 2016-04-08 DIAGNOSIS — N183 Chronic kidney disease, stage 3 (moderate): Secondary | ICD-10-CM | POA: Diagnosis not present

## 2016-04-08 DIAGNOSIS — E1140 Type 2 diabetes mellitus with diabetic neuropathy, unspecified: Secondary | ICD-10-CM | POA: Diagnosis not present

## 2016-04-08 DIAGNOSIS — J9611 Chronic respiratory failure with hypoxia: Secondary | ICD-10-CM

## 2016-04-08 LAB — RENAL FUNCTION PANEL
ALBUMIN: 4.2 g/dL (ref 3.5–5.2)
BUN: 32 mg/dL — AB (ref 6–23)
CHLORIDE: 102 meq/L (ref 96–112)
CO2: 34 mEq/L — ABNORMAL HIGH (ref 19–32)
CREATININE: 2.03 mg/dL — AB (ref 0.40–1.50)
Calcium: 10.2 mg/dL (ref 8.4–10.5)
GFR: 33.53 mL/min — ABNORMAL LOW (ref >60.00)
Glucose, Bld: 135 mg/dL — ABNORMAL HIGH (ref 70–99)
PHOSPHORUS: 3.6 mg/dL (ref 2.3–4.6)
Potassium: 4.7 mEq/L (ref 3.5–5.1)
SODIUM: 141 meq/L (ref 135–145)

## 2016-04-08 LAB — HEMOGLOBIN A1C: Hgb A1c MFr Bld: 7 % — ABNORMAL HIGH (ref 4.6–6.5)

## 2016-04-08 NOTE — Assessment & Plan Note (Signed)
Doing better since stent No extra nitro need on the isosorbide

## 2016-04-08 NOTE — Progress Notes (Signed)
Pre visit review using our clinic review tool, if applicable. No additional management support is needed unless otherwise documented below in the visit note. 

## 2016-04-08 NOTE — Progress Notes (Signed)
Subjective:    Patient ID: [name], male    DOB: Mar 18, 1932, 83 y.o.   MRN: [id number]  HPI Here for follow up of diabetes and other chronic health conditions  Still has pain in his feet Tried lyrica and it caused edema at 3 a day, dizziness at 2 a day--so down to 1 (??helpful) This was from Dr Posey Pronto  Recent back reevaluation Had another MRI--confirmed spondylosis with compression Probably part of the leg pain also  Checks sugars 2-3 times per week Usually in 150's ---rarely up to 200 No hypoglycemic reactions Due for eye exam  RA reasonably controlled with the azathiprine Continues with [name] for this  Heart is okay No chest pain on the isosorbide--- no SL NTG Same SOB--- relates to his chronic lung disease  Still wears the oxygen at night  Current Outpatient Prescriptions on File Prior to Visit  Medication Sig Dispense Refill  . amLODipine (NORVASC) 10 MG tablet TAKE 1 TABLET BY MOUTH DAILY 90 tablet 3  . aspirin EC 81 MG tablet Take 81 mg by mouth at bedtime.    [name] atorvastatin (LIPITOR) 80 MG tablet Take 1 tablet (80 mg total) by mouth daily at 6 PM. 30 tablet 11  . azaTHIOprine (IMURAN) 50 MG tablet Take 100 mg by mouth 2 (two) times daily.     . Blood Glucose Monitoring Suppl (ONE TOUCH ULTRA 2) w/Device KIT Use to obtain blood sugar daily. Dx Code E11.40 1 each 0  . carvedilol (COREG) 12.5 MG tablet TAKE 1 TABLET BY MOUTH TWICE A DAY WITH MEALS 60 tablet 11  . clopidogrel (PLAVIX) 75 MG tablet Take 1 tablet (75 mg total) by mouth daily with breakfast. 30 tablet 11  . DHA-Vitamin C-Lutein (EYE HEALTH FORMULA PO) Take 1 tablet by mouth daily.     . furosemide (LASIX) 40 MG tablet TAKE 1 OR 2 TABLETS BY MOUTH DAILY 60 tablet 10  . glucose blood (ONE TOUCH ULTRA TEST) test strip USE TO CHECK BLOOD SUGAR ONCE A DAY Dx Code E11.40 100 each 3  . isosorbide mononitrate (IMDUR) 30 MG 24 hr tablet Take 1 tablet (30 mg total) by mouth daily. 90 tablet 3  .  lidocaine (XYLOCAINE) 5 % ointment Apply 1 application topically 2 (two) times daily as needed. 35.44 g 5  . losartan (COZAAR) 100 MG tablet TAKE 1 TABLET BY MOUTH DAILY 90 tablet 3  . metFORMIN (GLUCOPHAGE) 1000 MG tablet TAKE 1 TABLET BY MOUTH TWICE A DAY 60 tablet 10  . Multiple Vitamin (MULTIVITAMIN WITH MINERALS) TABS tablet Take 1 tablet by mouth daily.    . nitroGLYCERIN (NITROSTAT) 0.4 MG SL tablet DISSOLVE 1 TABLET UNDER THE TONGUE FOR CHEST PAIN. MAY REPEAT EVERY 5MINUTES UP TO 3 DOSES. IF NO RELIEF, CALL 911** 25 tablet 5  . ONETOUCH DELICA LANCETS 57D MISC     . pregabalin (LYRICA) 100 MG capsule Take 1 capsule (100 mg total) by mouth 3 (three) times daily. (Patient taking differently: Take 100 mg by mouth daily. ) 90 capsule 3   No current facility-administered medications on file prior to visit.     Allergies  Allergen Reactions  . Doxazosin Mesylate Other (See Comments)    dizziness  . Methocarbamol Rash    Past Medical History:  Diagnosis Date  . Arthritis    osteoarthritis, s/p R TKR, and digits  . CAD (coronary artery disease)    a. s/p CABG (2001)  b. s/p DES to RCA and  cutting POBA to ostial PDA (2013)   c. s/p DES to SVG to OM2 (01/14/14) d. cath: 08/2015 NSTEMI w/ patent LIMA-LAD and 99% stenosis of SVG-OM w/ DES placed. CTO of SVG-RCA and SVG-D1.   [name] Chronic diastolic CHF (congestive heart failure) ([place])    a) 09/13 ECHO- LVEF 86-57%, grade 1 diastolic dysfunction, mild LA dilatation, atrial septal aneurysm, AV mobility restricted, but no sig AS by doppler; b) 09/04/08 ECHO- LVH, ef 60%, mild AS, c. echo 08/2015: EF perserved of 55-60% with inferolateral HK. Mild AS noted.  . Chronic kidney disease, stage III (moderate)   . Chronic lower back pain   . Colon polyps   . Diverticulosis   . Dyspnea 2009 since July -Sept   05/06/08-CPST-  normal effort, reduced VO2 max 20.5 /65%, reduced at 8.2/ 40%, normal breathing resetvca of 55%, submaximal heart rate response  112/77%, flattened o2 pluse response at peak exercise-12 ml/beat @ 85%, No VQ mismatch abnormalities, All c/w CIRC Limitation  . Enlarged prostate   . Esophageal stricture    a. s/p dilation spring 2010  . GERD (gastroesophageal reflux disease)   . Heart murmur   . Hiatal hernia   . History of PFTs    mixed pattern on spiro. mild restn on lung volumes with near normal DLCO. Pattern can be explained by CABG scar. Fev1 2.2L/73%, ratio 68 (67), TLC 4.7/68%,RV 1.5L/55%,DLCO 79%  . Hyperlipidemia   . Hypertension   . Interstitial lung disease ([name])    NOS  . Overweight (BMI 25.0-29.9)    BMI 29  . RA (rheumatoid arthritis) ([name])    [name]  . Seropositive rheumatoid arthritis ([place])   . Type II diabetes mellitus ([place])     Past Surgical History:  Procedure Laterality Date  . CARDIAC CATHETERIZATION  08/2004   CP- no MI, Cath- small vessell disease   . CARDIAC CATHETERIZATION  12/31/2011   80% distal LM, 100% native LAD, LCx and RCA, 30% prox SVG-OM, SVG-D1 normal, 99% distal, 80% ostial SVG-RCA distal to graft, LIMA-LAD normal; LVEF mildly decreased with posterior basal AK   . CARDIAC CATHETERIZATION  2009   with patent grafts/notes 12/31/2011  . CARDIAC CATHETERIZATION N/A 08/13/2015   Procedure: Left Heart Cath and Cors/Grafts Angiography;  Surgeon: Sherren Mocha, MD;  Location: [place] [facility] LAB;  Service: Cardiovascular;  Laterality: N/A;  . CATARACT EXTRACTION W/ INTRAOCULAR LENS  IMPLANT, BILATERAL Bilateral   . CHOLECYSTECTOMY OPEN  11/2003   [name]  . CORONARY ANGIOPLASTY WITH STENT PLACEMENT  01/03/2012   Successful DES to SVG-RCA and cutting balloon angioplasty ostial  PDA   . CORONARY ANGIOPLASTY WITH STENT PLACEMENT  01/14/2014   "1"  . CORONARY ARTERY BYPASS GRAFT  11/1999   CABG X5  . CORONARY STENT PLACEMENT  02/2012   1 stent and balloon  . ESOPHAGOGASTRODUODENOSCOPY (EGD) WITH ESOPHAGEAL DILATION  2010  . HAND SURGERY    . JOINT REPLACEMENT    . KNEE  ARTHROSCOPY Right 2008  . LEFT AND RIGHT HEART CATHETERIZATION WITH CORONARY ANGIOGRAM  12/31/2011   Procedure: LEFT AND RIGHT HEART CATHETERIZATION WITH CORONARY ANGIOGRAM;  Surgeon: Burnell Blanks, MD;  Location: [facility] CATH LAB;  Service: Cardiovascular;;  . LEFT AND RIGHT HEART CATHETERIZATION WITH CORONARY ANGIOGRAM N/A 01/14/2014   Procedure: LEFT AND RIGHT HEART CATHETERIZATION WITH CORONARY ANGIOGRAM;  Surgeon: Peter M Martinique, MD;  Location: [facility] CATH LAB;  Service: Cardiovascular;  Laterality: N/A;  . PERCUTANEOUS CORONARY INTERVENTION-BALLOON ONLY  01/03/2012  Procedure: PERCUTANEOUS CORONARY INTERVENTION-BALLOON ONLY;  Surgeon: Peter M Martinique, MD;  Location: [facility] CATH LAB;  Service: Cardiovascular;;  . PERCUTANEOUS CORONARY STENT INTERVENTION (PCI-S)  12/31/2011   Procedure: PERCUTANEOUS CORONARY STENT INTERVENTION (PCI-S);  Surgeon: Burnell Blanks, MD;  Location: [facility] CATH LAB;  Service: Cardiovascular;;  . PERCUTANEOUS CORONARY STENT INTERVENTION (PCI-S) N/A 01/03/2012   Procedure: PERCUTANEOUS CORONARY STENT INTERVENTION (PCI-S);  Surgeon: Peter M Martinique, MD;  Location: [facility] CATH LAB;  Service: Cardiovascular;  Laterality: N/A;  . SHOULDER ARTHROSCOPY WITH OPEN ROTATOR CUFF REPAIR AND DISTAL CLAVICLE ACROMINECTOMY Left 02/27/2013   Procedure: LEFT SHOULDER ARTHROSCOPY WITH MINI OPEN ROTATOR CUFF REPAIR AND SUBACROMIAL DECOMPRESSION AND DISTAL CLAVICLE RESECTION;  Surgeon: [name], MD;  Location: [place];  Service: Orthopedics;  Laterality: Left;  . TOTAL KNEE ARTHROPLASTY Right 03/2010   [name]  . TRIGGER FINGER RELEASE Left 02/27/2013   Procedure: RELEASE TRIGGER FINGER/A-1 PULLEY;  Surgeon: [name], MD;  Location: [place];  Service: Orthopedics;  Laterality: Left;    Family History  Problem Relation Age of Onset  . COPD Mother   . Heart disease Father   . Heart attack Father   . Diabetes Brother   . Colon cancer Brother 25  . Alcohol abuse Sister   .  Stroke Sister     Social History   Social History  . Marital status: Married    Spouse name: N/A  . Number of children: 3  . Years of education: N/A   Occupational History  . Conservation officer, nature Retired    retired   Social History Main Topics  . Smoking status: Former Smoker    Packs/day: 1.00    Years: 20.00    Types: Cigarettes    Quit date: 04/06/1963  . Smokeless tobacco: Never Used  . Alcohol use No     Comment: 01/01/2012 "last alcohol ~ 50 yr ago"  . Drug use: No  . Sexual activity: Not Currently   Other Topics Concern  . Not on file   Social History Narrative   No living will   Requests wife as health care POA   Discussed DNR --he requests this (done 08/29/12)   Not sure about feeding tube---but might accept for some time   Patient lives with wife and daughter in a one story home.  Has 3 children.  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   Review of Systems Appetite is good Weight stable Sleeps okay    Objective:   Physical Exam  Constitutional: He appears well-nourished. No distress.  Neck: Normal range of motion. Neck supple. No thyromegaly present.  Cardiovascular: Normal rate, regular rhythm and intact distal pulses.  Exam reveals no gallop.   Soft coarse systolic murmur at base  Pulmonary/Chest: Effort normal. No respiratory distress. He has no wheezes.  Slightly decreased breath sounds Dry bibasilar crackles  Musculoskeletal: He exhibits no edema.  Lymphadenopathy:    He has no cervical adenopathy.  Skin:  No foot lesions  Psychiatric: He has a normal mood and affect. His behavior is normal.          Assessment & Plan:

## 2016-04-08 NOTE — Assessment & Plan Note (Signed)
Hopefully still good control I suspect the leg pain is diabetic and from spinal stenosis Still working with Dr Posey Pronto

## 2016-04-08 NOTE — Assessment & Plan Note (Signed)
Still uses the oxygen at night Hasn't needed in day recently

## 2016-04-08 NOTE — Assessment & Plan Note (Signed)
Compensated Seems to have neutral fluid status

## 2016-04-08 NOTE — Assessment & Plan Note (Signed)
Will recheck labs No ACEI/ARB due to his lung disease

## 2016-04-08 NOTE — Assessment & Plan Note (Signed)
Chronic Stable DOE

## 2016-04-08 NOTE — Assessment & Plan Note (Signed)
Relatively controlled on the imuran

## 2016-04-17 DIAGNOSIS — J8410 Pulmonary fibrosis, unspecified: Secondary | ICD-10-CM | POA: Diagnosis not present

## 2016-04-17 DIAGNOSIS — R0689 Other abnormalities of breathing: Secondary | ICD-10-CM | POA: Diagnosis not present

## 2016-04-27 ENCOUNTER — Other Ambulatory Visit: Payer: Self-pay | Admitting: Internal Medicine

## 2016-04-30 ENCOUNTER — Ambulatory Visit (INDEPENDENT_AMBULATORY_CARE_PROVIDER_SITE_OTHER): Payer: Medicare Other | Admitting: Neurology

## 2016-04-30 ENCOUNTER — Encounter: Payer: Self-pay | Admitting: Neurology

## 2016-04-30 VITALS — BP 104/64 | HR 66 | Ht 73.0 in | Wt 218.4 lb

## 2016-04-30 DIAGNOSIS — M0550 Rheumatoid polyneuropathy with rheumatoid arthritis of unspecified site: Secondary | ICD-10-CM

## 2016-04-30 DIAGNOSIS — E0842 Diabetes mellitus due to underlying condition with diabetic polyneuropathy: Secondary | ICD-10-CM

## 2016-04-30 MED ORDER — CARBAMAZEPINE 200 MG PO TABS: ORAL_TABLET | ORAL | 5 refills | Status: DC

## 2016-04-30 NOTE — Patient Instructions (Addendum)
1.  Start carbamazepine 200mg  1 tablet at bedtime for two weeks, then increase to 1 tablet in the morning and bedtime.  If the medication makes you sleepy or lightheaded, take at bedtime only.  If you develop any rash, STOP the medication immediately.  2.  Continue Lyrica 100mg  twice daily  Return to clinic in 3-4 months

## 2016-04-30 NOTE — Progress Notes (Signed)
Follow-up Visit   Date: 04/30/16    [name] MRN: [id number] DOB: 1931/06/16   Interim History: [name] is a 83 y.o. right-handed Caucasian male with CAD, GERD, interstitial lung disease, rheumatoid arthritis (on imuran), aortic stenosis, CKD stage 3, hyperlipidemia, and hypertension returning to the clinic for follow-up of neuropathy.  The patient was accompanied to the clinic by self.  History of present illness: He was diagnosed with diabetes about 10-15 years ago and recall havinghaving burning pain involving the toes. Since then, there has been gradual worsening in intensity of pain and it now also involves the dorsum of the foot. There is no weakness, imbalance, or gait difficulty. He still walks independently and denies any falls. He takes gabapentin '900mg'$  in the morning, '600mg'$  in the afternoon, and '600mg'$  at bedtime which provides about 50% relief. He has previously tried nortriptyline but does not recall whether it did not work or stopped it because of side effects. He was tried on Cymbalta but this made him very dizzy and sleepy. He has seen podiatry for this and had steroid injections for capsulitis which did not provide much relief. He takes imuran for rheumatoid arthritis. He has chronic low back pain and gets ESI several times per year.   UPDATE 09/24/2015:  Unfortunately, there was no benefit with desimpramine '20mg'$  at bedtime.  He reports tripping over his oxygen cord an fell about a month ago, but otherwise walks independently and denies imbalance.  His feet continues to be most painful and bothersome.    UPDATE 12/25/2015:  He only had mild benefit with lidocaine and none with Aspercream, so discontinued both.  The pain is in his feet is unchanged and continues to be bothersome, especially at night.  He would like to try Lyrica.  Despite my recommendations, he reports to taking gabapentin '900mg'$  in the morning, '600mg'$  in the afternoon, and '1200mg'$  at  bedtime.  His GFR is 35 and certainly should not be on this dosage of gabapentin.  He is not feeling very well today because of diarrhea.  UPDATE 04/30/2016:  He reports having 50% improvement with Lyrica '100mg'$  BID.  Unfortunately, he was unable to tolerate a higher dose due to leg edema. He discontinued gabapentin and did not appreciate any worsening pain as he tapered this. It is fair to say, this was ineffective for him.  The shooting pain is still very severe and he would like to try alternative options. No new weakness or falls.   Medications:  Current Outpatient Prescriptions on File Prior to Visit  Medication Sig Dispense Refill  . amLODipine (NORVASC) 10 MG tablet TAKE 1 TABLET BY MOUTH DAILY 90 tablet 3  . aspirin EC 81 MG tablet Take 81 mg by mouth at bedtime.    [name] atorvastatin (LIPITOR) 80 MG tablet Take 1 tablet (80 mg total) by mouth daily at 6 PM. 30 tablet 11  . azaTHIOprine (IMURAN) 50 MG tablet Take 100 mg by mouth 2 (two) times daily.     . Blood Glucose Monitoring Suppl (ONE TOUCH ULTRA 2) w/Device KIT Use to obtain blood sugar daily. Dx Code E11.40 1 each 0  . carvedilol (COREG) 12.5 MG tablet TAKE 1 TABLET BY MOUTH TWICE A DAY WITH MEALS 60 tablet 11  . clopidogrel (PLAVIX) 75 MG tablet Take 1 tablet (75 mg total) by mouth daily with breakfast. 30 tablet 11  . DHA-Vitamin C-Lutein (EYE HEALTH FORMULA PO) Take 1 tablet by mouth daily.     [name]  furosemide (LASIX) 40 MG tablet TAKE 1 OR 2 TABLETS BY MOUTH DAILY 60 tablet 10  . glucose blood (ONE TOUCH ULTRA TEST) test strip USE TO CHECK BLOOD SUGAR ONCE A DAY Dx Code E11.40 100 each 3  . isosorbide mononitrate (IMDUR) 30 MG 24 hr tablet Take 1 tablet (30 mg total) by mouth daily. 90 tablet 3  . lidocaine (XYLOCAINE) 5 % ointment Apply 1 application topically 2 (two) times daily as needed. 35.44 g 5  . losartan (COZAAR) 100 MG tablet TAKE 1 TABLET BY MOUTH DAILY 90 tablet 3  . metFORMIN (GLUCOPHAGE) 1000 MG tablet TAKE 1 TABLET BY  MOUTH TWICE A DAY 60 tablet 10  . Multiple Vitamin (MULTIVITAMIN WITH MINERALS) TABS tablet Take 1 tablet by mouth daily.    . nitroGLYCERIN (NITROSTAT) 0.4 MG SL tablet DISSOLVE 1 TABLET UNDER THE TONGUE FOR CHEST PAIN. MAY REPEAT EVERY 5MINUTES UP TO 3 DOSES. IF NO RELIEF, CALL 911** 25 tablet 5  . ONETOUCH DELICA LANCETS 16X MISC     . pregabalin (LYRICA) 100 MG capsule Take 1 capsule (100 mg total) by mouth 3 (three) times daily. (Patient taking differently: Take 100 mg by mouth daily. ) 90 capsule 3   No current facility-administered medications on file prior to visit.     Allergies:  Allergies  Allergen Reactions  . Doxazosin Mesylate Other (See Comments)    dizziness  . Methocarbamol Rash    Review of Systems:  CONSTITUTIONAL: No fevers, chills, night sweats, or weight loss.  EYES: No visual changes or eye pain ENT: No hearing changes.  No history of nose bleeds.   RESPIRATORY: No cough, wheezing and shortness of breath.   CARDIOVASCULAR: Negative for chest pain, and palpitations.   GI: +for abdominal discomfort, blood in stools or black stools.  No recent change in bowel habits.   GU:  No history of incontinence.   MUSCLOSKELETAL: No history of joint pain or swelling.  No myalgias.   SKIN: Negative for lesions, rash, and itching.   ENDOCRINE: Negative for cold or heat intolerance, polydipsia or goiter.   PSYCH:  No depression or anxiety symptoms.   NEURO: As Above.   Vital Signs:  BP 104/64   Pulse 66   Ht '6\' 1"'$  (1.854 m)   Wt 218 lb 7 oz (99.1 kg)   SpO2 95%   BMI 28.82 kg/m   Neurological Exam: MENTAL STATUS including orientation to time, place, person, recent and remote memory, attention span and concentration, language, and fund of knowledge is normal.  Speech is not dysarthric.  CRANIAL NERVES:  Face is symmetric.  MOTOR:  Motor strength is 5/5 in all extremities  COORDINATION/GAIT:   Gait narrow based and stable.   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 - Previously tried medications: gabapentin, nortriptyline, despiramine, lidocaine ointment, Aspercream - Continue Lyrica '100mg'$  BID - Start carbamazepine '200mg'$  at bedtime x 2 weeks, then increase to '200mg'$  BID.  Side effects discussed.  If he develops any rash, he was instructed to stop the medication immediately. - Continue alpha-lipoic acid '600mg'$ /d  Return to clinic in 4 months.   The duration of this  appointment visit was 25 minutes of face-to-face time with the patient.  Greater than 50% of this time was spent in counseling, explanation of diagnosis, planning of further management, and coordination of care.   Thank you for allowing me to participate in patient's care.  If I can answer any additional questions, I would be pleased to do so.    Sincerely,    Yanci Bachtell K. Posey Pronto, DO

## 2016-05-11 ENCOUNTER — Other Ambulatory Visit: Payer: Self-pay | Admitting: Rheumatology

## 2016-05-11 DIAGNOSIS — Z79899 Other long term (current) drug therapy: Secondary | ICD-10-CM | POA: Diagnosis not present

## 2016-05-12 LAB — CBC WITH DIFFERENTIAL/PLATELET
BASOS ABS: 0.1 10*3/uL (ref 0.0–0.2)
Basos: 1 %
EOS (ABSOLUTE): 0.4 10*3/uL (ref 0.0–0.4)
Eos: 5 %
HEMATOCRIT: 29.4 % — AB (ref 37.5–51.0)
HEMOGLOBIN: 10 g/dL — AB (ref 13.0–17.7)
LYMPHS ABS: 1.7 10*3/uL (ref 0.7–3.1)
LYMPHS: 19 %
MCH: 27.5 pg (ref 26.6–33.0)
MCHC: 34 g/dL (ref 31.5–35.7)
MCV: 81 fL (ref 79–97)
MONOCYTES: 9 %
Monocytes Absolute: 0.8 10*3/uL (ref 0.1–0.9)
NEUTROS PCT: 66 %
Neutrophils Absolute: 6 10*3/uL (ref 1.4–7.0)
PLATELETS: 249 10*3/uL (ref 150–379)
RBC: 3.64 x10E6/uL — AB (ref 4.14–5.80)
RDW: 16.3 % — ABNORMAL HIGH (ref 12.3–15.4)
WBC: 9 10*3/uL (ref 3.4–10.8)

## 2016-05-12 LAB — COMPREHENSIVE METABOLIC PANEL
A/G RATIO: 1.1 — AB (ref 1.2–2.2)
ALBUMIN: 3.9 g/dL (ref 3.5–4.7)
ALT: 12 IU/L (ref 0–44)
AST: 17 IU/L (ref 0–40)
Alkaline Phosphatase: 74 IU/L (ref 39–117)
BUN/Creatinine Ratio: 18 (ref 10–24)
BUN: 36 mg/dL — ABNORMAL HIGH (ref 8–27)
Bilirubin Total: 0.3 mg/dL (ref 0.0–1.2)
CALCIUM: 10 mg/dL (ref 8.6–10.2)
CO2: 30 mmol/L — ABNORMAL HIGH (ref 18–29)
Chloride: 100 mmol/L (ref 96–106)
Creatinine, Ser: 1.97 mg/dL — ABNORMAL HIGH (ref 0.76–1.27)
GFR, EST AFRICAN AMERICAN: 36 mL/min/{1.73_m2} — AB (ref >59)
GFR, EST NON AFRICAN AMERICAN: 31 mL/min/{1.73_m2} — AB (ref >59)
Globulin, Total: 3.5 g/dL (ref 1.5–4.5)
Glucose: 127 mg/dL — ABNORMAL HIGH (ref 65–99)
POTASSIUM: 4.6 mmol/L (ref 3.5–5.2)
Sodium: 140 mmol/L (ref 134–144)
TOTAL PROTEIN: 7.4 g/dL (ref 6.0–8.5)

## 2016-05-14 ENCOUNTER — Encounter (INDEPENDENT_AMBULATORY_CARE_PROVIDER_SITE_OTHER): Payer: Self-pay

## 2016-05-14 ENCOUNTER — Encounter: Payer: Self-pay | Admitting: Family Medicine

## 2016-05-14 ENCOUNTER — Ambulatory Visit: Payer: Medicare Other | Admitting: Family Medicine

## 2016-05-14 ENCOUNTER — Ambulatory Visit (INDEPENDENT_AMBULATORY_CARE_PROVIDER_SITE_OTHER): Payer: Medicare Other | Admitting: Family Medicine

## 2016-05-14 DIAGNOSIS — R42 Dizziness and giddiness: Secondary | ICD-10-CM | POA: Insufficient documentation

## 2016-05-14 DIAGNOSIS — T421X5A Adverse effect of iminostilbenes, initial encounter: Secondary | ICD-10-CM | POA: Diagnosis not present

## 2016-05-14 NOTE — Progress Notes (Signed)
   Subjective:    Patient ID: [name], male    DOB: 03/14/1932, 83 y.o.   MRN: [id number]  HPI   83 year old male patient of Dr. Silvio Pate with history of  Cad, CKD, CHF, HTN, GERD/esophagitis,  And ILD presents with new onset dizziness as well as nausea constantly as well as worse after eating.  NO EMESIS.    He first noted in last  1.5 weeks started with dizziness. Describes as lightheaded, constantly. No change with movement.  No abd pain.  No dysuria.  No HA. Feels more bleary eyed.  No fever, cold symptoms. No ear pain. No new numbness or weakness.     He  Had labs checked on 2/6 by  rheum for upcoming OV showing  stable CKD, fairly  stable anemia ( no bleeding, moderate energy level).   He was started on carbamazepine for painful olyneuropathy by [facility] on 04/30/2016. ( 44% reported dizziness  He is still taking this, but stopped lyrica (2 days ago) that did not help symptoms.  Blood pressure 110/60, pulse 68, temperature 97.5 F (36.4 C), temperature source Oral, height 6\' 1"  (1.854 m), weight 214 lb 4 oz (97.2 kg). Review of Systems     Objective:   Physical Exam  Constitutional: He is oriented to person, place, and time. Vital signs are normal. He appears well-developed and well-nourished.  HENT:  Head: Normocephalic.  Right Ear: Hearing normal.  Left Ear: Hearing normal.  Nose: Nose normal.  Mouth/Throat: Oropharynx is clear and moist and mucous membranes are normal.  Neck: Trachea normal. Carotid bruit is not present. No thyroid mass and no thyromegaly present.  Cardiovascular: Normal rate, regular rhythm and normal pulses.  Exam reveals no gallop, no distant heart sounds and no friction rub.   Murmur heard.  Systolic murmur is present with a grade of 2/6  No peripheral edema  Pulmonary/Chest: Effort normal and breath sounds normal. No respiratory distress.  Neurological: He is alert and oriented to person, place, and time. He has normal strength. A sensory  deficit is present. No cranial nerve deficit. He exhibits normal muscle tone. Coordination and gait normal. GCS eye subscore is 4. GCS verbal subscore is 5. GCS motor subscore is 6.  Skin: Skin is warm, dry and intact. No rash noted.  Psychiatric: He has a normal mood and affect. His speech is normal and behavior is normal. Thought content normal.          Assessment & Plan:

## 2016-05-14 NOTE — Patient Instructions (Addendum)
Stop the new medicine carbamazepine.  Call and tell neurologist that you hads dizziness with it... To get other recommendations.  Restart lyrica for now for nerve pain. If the dizziness is not improving in 1-2 weeks off the medication call PCP.

## 2016-05-14 NOTE — Progress Notes (Signed)
Pre visit review using our clinic review tool, if applicable. No additional management support is needed unless otherwise documented below in the visit note. 

## 2016-05-14 NOTE — Assessment & Plan Note (Signed)
Most likely new symptoms are due to carbamazepine.  Pt will stop and restart lyrica.  Recent labs stable and no current evidence of infection or nrew cardiopulmonary issue.

## 2016-05-17 ENCOUNTER — Telehealth: Payer: Self-pay | Admitting: *Deleted

## 2016-05-17 NOTE — Telephone Encounter (Signed)
Noted - d/c carbamazepine.  Stay on Lyrica 100mg  twice daily.  Donika K. Posey Pronto, DO

## 2016-05-17 NOTE — Addendum Note (Signed)
Addended by: Alda Berthold on: 05/17/2016 01:52 PM   Modules accepted: Orders

## 2016-05-17 NOTE — Telephone Encounter (Signed)
Patient left message stating that ever since he started the medication you put him on at the last visit he has been nauseas and swimmy headed.  His PCP stopped the medication at his last visit with her.

## 2016-05-18 DIAGNOSIS — J8410 Pulmonary fibrosis, unspecified: Secondary | ICD-10-CM | POA: Diagnosis not present

## 2016-05-18 DIAGNOSIS — R0689 Other abnormalities of breathing: Secondary | ICD-10-CM | POA: Diagnosis not present

## 2016-06-07 ENCOUNTER — Encounter: Payer: Self-pay | Admitting: Internal Medicine

## 2016-06-07 ENCOUNTER — Ambulatory Visit (INDEPENDENT_AMBULATORY_CARE_PROVIDER_SITE_OTHER): Payer: Medicare Other | Admitting: Internal Medicine

## 2016-06-07 VITALS — BP 104/64 | HR 65 | Ht 73.0 in | Wt 224.8 lb

## 2016-06-07 DIAGNOSIS — R0600 Dyspnea, unspecified: Secondary | ICD-10-CM | POA: Diagnosis not present

## 2016-06-07 MED ORDER — FLUTICASONE-UMECLIDIN-VILANT 100-62.5-25 MCG/INH IN AEPB: 1.0000 | INHALATION_SPRAY | Freq: Every day | RESPIRATORY_TRACT | 0 refills | Status: DC

## 2016-06-07 NOTE — Progress Notes (Signed)
Subjective:     Patient ID: [name], male   DOB: Jul 06, 1931, 83 y.o.   MRN: [id number]  HPI    83 yo male former smoker with RA-ILD   TEST     - Coronary artery disease. s/p CABG x 5 in 2002.  Dyspnea started following lopressor Jan-July 2009 and lisinopril increae July 2009. No relief despite stopping agents July 2010  -   Myoview on April 17, 2007 was  nonischemic with an EF of 51% and inferobasal wall scar.   - CAth Oct 2009:    - . Severe native three-vessel coronary artery disease. 2. Status post multivessel coronary bypass surgery with all grafts  patent. Normal LVEF and LVEDP - PFT - Mixed pattern on spiro. Mild restn on lung volumeswith near normal DLCO.   - Fev1 2.2L/73%, FVC 3.21L/70%, Ratio 68 (67),  TLC 4.7L/68%, RV 1.5L/55%, DLCO 79%.  -CPST 05/06/2008: Normal effot.    - Reduced VO2 max 20.5/65%, REduced AT 8.2/40%, Normal breathing reservce of 55%, submaximal heart rate response 112/77%,  flattened O2 pulse response at peak exercise - 55m/beat at 85%. No VQ mismatch abnormalities. - ECHO 09/04/2008: LVH, ef 60%, mild AS and unchanged from prior   - CT chest   - Non specific Interstitial Lung disease NOS. Stable pulm infilratates RLL ggo > LLL ggo. 03/2008 -> 10/2008 -> June 2011: stable - Rehab: never attended 2009-2013 due to cost co.  2013 Walking desat test : resting 98/94% -> 3 laps x 185 feet; HR 113/pulse ox 96%    PFTs July 2013 show worsening restriction since 2010 along with reduced diffusion. The RLL baseline GGO might be worse in July 2013 compared to 2011. In addition, autommune profile shows VERY HIGH TITERS of CCP antibody > 300 which is pathognomonic of rheumtoid arthritis lung involvement. RF is only borderline elevated a 23  PFT 10/12/11>>FEV1 2.02 (70%), Fvc 2.8/72%, TLC 4.07 (59%), DLCO 62%, no BD: RESTRICTION - WORSE since 2010 -(2010:  Fev1 2.2L/73%, FVC 3.21L/70%, Ratio 68 (67),  TLC 4.7L/68%, RV 1.5L/55%, DLCO 79%)   Echo 05/04/11>>mild LVH,  EF 55 to 650% grade 1 diastolic dysfx, mild AS, mild MR, PAS 34 mmHg  CT 11/02/11 - calcification suspicious for aortic valve and some GGO esp RLL > LLL (? RLL worse compared to 2011)   [place]: dLM 80%, LAD 100%, circumflex 100%, RCA 100%, proximal SVG-OM 30%, SVG-D1 ok, SVG-RCA 99% distal and 80% ostial PDA distal to graft, LIMA-LAD ok with some left to right collaterals.  PCI 01/03/12: Promus DES to the SVG-RCA and Cutting Balloon angioplasty to the ostial PDA.  Echocardiogram 12/31/11: EF 503-70% grade 1 diastolic dysfunction, mild aortic stenosis, mean gradient 9, mild LAE.   Walking desaturation test on 05/05/2012 185 feet x 3 laps:  did NOT desaturate. Rest pulse ox was 98%, final pulse ox was 96%. HR response 69/min at rest to 82/min at peak exertion.   PFT FVC fev1 ratio BD fev1 TLC DLCO comment intervention  2010 3.2L/70% 2.2L/73%   4.7L/68% 79%    10/12/11 2.8L/72% 2.02L/70% 72  4.07L/59% 62% Worsening restriction since 2010 Rheum will start immuran/pred. Also had PCI in nov 2013  04/28/12 2.6L/59% 1.77L/62% 67  4.0L/59% 15.7/71%     '2015 PFT showed no significant change with FEV1 56% , ratio 66,  FVC 61%.   07/31/2015 Follow up : ILD ? RA related  Pt returns for follow up . Has been have more DOE.  Was set up  for a CT chest  PFT on 4/25 showed FEV1 57%, ratio 71, FVC 58%, no sign BD response , + BD in midflows.  DLCO 40%. (this is decreased from 2015- was 59%.  TLC is similar to 2015 . Has been started on Imuran for RA ILD .  CT chest did not show any sgin change , cont Bilateral LL GGO and bronchiectasis .  Remains on O2 2l/m  At bedtime  . ONO showed desats on RA , O2 was continued.  Has been seen by [facility] last year. Now on Imuran, feels Arthritis pain is better.  Says he is feels he is doing okay, gets winded with walking long distances.  Can walk 1/2 block before stopping.  Has dry cough some days.  PVX and Prevnar 13 are utd.   Followed by Dr. Estil Daft in Rheum. Remains  on Imuran. Most pain is in his hands.   Denies chest pain, orthopnea , edema or fever.    OV 12/09/2015   Chief Complaint  Patient presents with  . Follow-up    Feels about the same since the last time we saw him, cough, with some mucus    Follow-up refractory dyspnea in the setting of mild interstitial lung disease from rheumatoid arthritis and coronary artery disease and multifactorial reasons. Refractory to pulmonary rehabilitation.  He did see my nurse practitioner April 2017 and CT chest did not show any changes to his bilateral lower lobe groundglass opacities and bronchiectasis. He is being maintained on Imuran for his rheumatoid arthritis. He is on oxygen 2 L at bedtime.     Had echocardiogram May 2017 that shows elevated pulmonary artery systolic pressure, mild aortic stenosis and diastolic dysfunction. On 08/13/2015 he underwent left heart catheterization by Dr. Sherren Mocha that shows severe native three-vessel coronary artery disease status post bypass with continued patency of the LIMA to LAD and continued patency of the saphenous vein graft to OM with severe stenosis in the proximal body of the graft for which he had successful PCI of a drug-eluting stent treatment of in-stent restenosis. Recommendation is dual antiplatelet therapy lifelong.   He is with his wife currently. He says dyspnea is only some better. He still has significant dyspnea and faitgues more than his wife.   OV 06/07/2016  Chief Complaint  Patient presents with  . Follow-up    Pt feels like his breathing has not been doing good, pt c/o coughing more with greyish colored mucus, more increase sob with exertion, has some chest tightnes, does has some congestion Denies fever   Follow-up refractory dyspnea in the setting of rheumatoid arthritis and symmetric and coronary artery disease. Multifactorial dyspnea. On Imuran for rheumatoid arthritis oxygen 2 L at bedtime. He is refractory to pulmonary  rehabilitation. He is believed to have some interstitial lung disease but April 2017 CT chest suggested only mild bronchiectasis  Last seen in September 2017 and at that time as dyspnea with some better after having a cardiac stent. I discussed with his cardiologist about the possibility of having right heart catheterization but it was felt that the elevation in pulmonary artery pressure was not significant enough on the echocardiogram to warrant this. He tells me that he is deteriorated now and this shortness of breath is even worse. He has tried Symbicort in the past without much help. He is willing to try "anything" to help his dyspnea. Walking desaturation test on 06/07/2016 185 feet x 3 laps on RA:  did NOT desaturate. Rest pulse  ox was 97%, final pulse ox was 95%. Did only 2 laps and stopped in interim x 2. . HR response 64/min at rest to 70/min at peak exertion. He says he stopped due to dyspnea and chest pain - like always    has a past medical history of Arthritis; CAD (coronary artery disease); Chronic diastolic CHF (congestive heart failure) ([place]); Chronic kidney disease, stage III (moderate); Chronic lower back pain; Colon polyps; Diverticulosis; Dyspnea (2009 since July -Sept); Enlarged prostate; Esophageal stricture; GERD (gastroesophageal reflux disease); Heart murmur; Hiatal hernia; History of PFTs; Hyperlipidemia; Hypertension; Interstitial lung disease ([place]); Overweight (BMI 25.0-29.9); RA (rheumatoid arthritis) ([place]); Seropositive rheumatoid arthritis ([place]); and Type II diabetes mellitus ([place]).   reports that he quit smoking about 53 years ago. His smoking use included Cigarettes. He has a 20.00 pack-year smoking history. He has never used smokeless tobacco.  Past Surgical History:  Procedure Laterality Date  . CARDIAC CATHETERIZATION  08/2004   CP- no MI, Cath- small vessell disease   . CARDIAC CATHETERIZATION  12/31/2011   80% distal LM, 100% native LAD, LCx and RCA, 30% prox SVG-OM,  SVG-D1 normal, 99% distal, 80% ostial SVG-RCA distal to graft, LIMA-LAD normal; LVEF mildly decreased with posterior basal AK   . CARDIAC CATHETERIZATION  2009   with patent grafts/notes 12/31/2011  . CARDIAC CATHETERIZATION N/A 08/13/2015   Procedure: Left Heart Cath and Cors/Grafts Angiography;  Surgeon: Sherren Mocha, MD;  Location: [place] CV LAB;  Service: Cardiovascular;  Laterality: N/A;  . CATARACT EXTRACTION W/ INTRAOCULAR LENS  IMPLANT, BILATERAL Bilateral   . CHOLECYSTECTOMY OPEN  11/2003   [name]  . CORONARY ANGIOPLASTY WITH STENT PLACEMENT  01/03/2012   Successful DES to SVG-RCA and cutting balloon angioplasty ostial  PDA   . CORONARY ANGIOPLASTY WITH STENT PLACEMENT  01/14/2014   "1"  . CORONARY ARTERY BYPASS GRAFT  11/1999   CABG X5  . CORONARY STENT PLACEMENT  02/2012   1 stent and balloon  . ESOPHAGOGASTRODUODENOSCOPY (EGD) WITH ESOPHAGEAL DILATION  2010  . HAND SURGERY    . JOINT REPLACEMENT    . KNEE ARTHROSCOPY Right 2008  . LEFT AND RIGHT HEART CATHETERIZATION WITH CORONARY ANGIOGRAM  12/31/2011   Procedure: LEFT AND RIGHT HEART CATHETERIZATION WITH CORONARY ANGIOGRAM;  Surgeon: Burnell Blanks, MD;  Location: [facility] CATH LAB;  Service: Cardiovascular;;  . LEFT AND RIGHT HEART CATHETERIZATION WITH CORONARY ANGIOGRAM N/A 01/14/2014   Procedure: LEFT AND RIGHT HEART CATHETERIZATION WITH CORONARY ANGIOGRAM;  Surgeon: Peter M Martinique, MD;  Location: [facility] CATH LAB;  Service: Cardiovascular;  Laterality: N/A;  . PERCUTANEOUS CORONARY INTERVENTION-BALLOON ONLY  01/03/2012   Procedure: PERCUTANEOUS CORONARY INTERVENTION-BALLOON ONLY;  Surgeon: Peter M Martinique, MD;  Location: [facility] CATH LAB;  Service: Cardiovascular;;  . PERCUTANEOUS CORONARY STENT INTERVENTION (PCI-S)  12/31/2011   Procedure: PERCUTANEOUS CORONARY STENT INTERVENTION (PCI-S);  Surgeon: Burnell Blanks, MD;  Location: [facility] CATH LAB;  Service: Cardiovascular;;  . PERCUTANEOUS CORONARY STENT INTERVENTION (PCI-S)  N/A 01/03/2012   Procedure: PERCUTANEOUS CORONARY STENT INTERVENTION (PCI-S);  Surgeon: Peter M Martinique, MD;  Location: [place] CATH LAB;  Service: Cardiovascular;  Laterality: N/A;  . SHOULDER ARTHROSCOPY WITH OPEN ROTATOR CUFF REPAIR AND DISTAL CLAVICLE ACROMINECTOMY Left 02/27/2013   Procedure: LEFT SHOULDER ARTHROSCOPY WITH MINI OPEN ROTATOR CUFF REPAIR AND SUBACROMIAL DECOMPRESSION AND DISTAL CLAVICLE RESECTION;  Surgeon: [name], MD;  Location: [place];  Service: Orthopedics;  Laterality: Left;  . TOTAL KNEE ARTHROPLASTY Right 03/2010   [name]  .  TRIGGER FINGER RELEASE Left 02/27/2013   Procedure: RELEASE TRIGGER FINGER/A-1 PULLEY;  Surgeon: [name], MD;  Location: [place];  Service: Orthopedics;  Laterality: Left;    Allergies  Allergen Reactions  . Doxazosin Mesylate Other (See Comments)    dizziness  . Methocarbamol Rash    Immunization History  Administered Date(s) Administered  . H1N1 03/27/2008  . Influenza Split 12/29/2010, 01/18/2012  . Influenza Whole 02/03/2007, 01/09/2008, 01/01/2009, 12/31/2009  . Influenza,inj,Quad PF,36+ Mos 12/13/2012, 01/15/2014, 01/15/2015, 01/06/2016  . Pneumococcal Conjugate-13 09/10/2013  . Pneumococcal Polysaccharide-23 03/05/1997, 08/17/2011  . Td 03/05/1997, 08/17/2011    Family History  Problem Relation Age of Onset  . COPD Mother   . Heart disease Father   . Heart attack Father   . Diabetes Brother   . Colon cancer Brother 25  . Alcohol abuse Sister   . Stroke Sister      Current Outpatient Prescriptions:  .  amLODipine (NORVASC) 10 MG tablet, TAKE 1 TABLET BY MOUTH DAILY, Disp: 90 tablet, Rfl: 3 .  aspirin EC 81 MG tablet, Take 81 mg by mouth at bedtime., Disp: , Rfl:  .  atorvastatin (LIPITOR) 80 MG tablet, Take 1 tablet (80 mg total) by mouth daily at 6 PM., Disp: 30 tablet, Rfl: 11 .  azaTHIOprine (IMURAN) 50 MG tablet, Take 100 mg by mouth 2 (two) times daily. , Disp: , Rfl:  .  Blood Glucose Monitoring  Suppl (ONE TOUCH ULTRA 2) w/Device KIT, Use to obtain blood sugar daily. Dx Code E11.40, Disp: 1 each, Rfl: 0 .  carvedilol (COREG) 12.5 MG tablet, TAKE 1 TABLET BY MOUTH TWICE A DAY WITH MEALS, Disp: 60 tablet, Rfl: 11 .  clopidogrel (PLAVIX) 75 MG tablet, Take 1 tablet (75 mg total) by mouth daily with breakfast., Disp: 30 tablet, Rfl: 11 .  DHA-Vitamin C-Lutein (EYE HEALTH FORMULA PO), Take 1 tablet by mouth daily. , Disp: , Rfl:  .  furosemide (LASIX) 40 MG tablet, TAKE 1 OR 2 TABLETS BY MOUTH DAILY, Disp: 60 tablet, Rfl: 10 .  glucose blood (ONE TOUCH ULTRA TEST) test strip, USE TO CHECK BLOOD SUGAR ONCE A DAY Dx Code E11.40, Disp: 100 each, Rfl: 3 .  isosorbide mononitrate (IMDUR) 30 MG 24 hr tablet, Take 1 tablet (30 mg total) by mouth daily., Disp: 90 tablet, Rfl: 3 .  lidocaine (XYLOCAINE) 5 % ointment, Apply 1 application topically 2 (two) times daily as needed., Disp: 35.44 g, Rfl: 5 .  losartan (COZAAR) 100 MG tablet, TAKE 1 TABLET BY MOUTH DAILY, Disp: 90 tablet, Rfl: 3 .  metFORMIN (GLUCOPHAGE) 1000 MG tablet, TAKE 1 TABLET BY MOUTH TWICE A DAY, Disp: 60 tablet, Rfl: 10 .  Multiple Vitamin (MULTIVITAMIN WITH MINERALS) TABS tablet, Take 1 tablet by mouth daily., Disp: , Rfl:  .  nitroGLYCERIN (NITROSTAT) 0.4 MG SL tablet, DISSOLVE 1 TABLET UNDER THE TONGUE FOR CHEST PAIN. MAY REPEAT EVERY 5MINUTES UP TO 3 DOSES. IF NO RELIEF, CALL 911**, Disp: 25 tablet, Rfl: 5 .  ONETOUCH DELICA LANCETS 10F MISC, , Disp: , Rfl:  .  pregabalin (LYRICA) 100 MG capsule, Take 1 capsule (100 mg total) by mouth 3 (three) times daily. (Patient taking differently: Take 100 mg by mouth 2 (two) times daily. ), Disp: 90 capsule, Rfl: 3   Review of Systems     Objective:   Physical Exam  Constitutional: He is oriented to person, place, and time. He appears well-developed and well-nourished. No distress.  HENT:  Head:  Normocephalic and atraumatic.  Right Ear: External ear normal.  Left Ear: External ear  normal.  Mouth/Throat: Oropharynx is clear and moist. No oropharyngeal exudate.  Eyes: Conjunctivae and EOM are normal. Pupils are equal, round, and reactive to light. Right eye exhibits no discharge. Left eye exhibits no discharge. No scleral icterus.  Neck: Normal range of motion. Neck supple. No JVD present. No tracheal deviation present. No thyromegaly present.  Cardiovascular: Normal rate, regular rhythm and intact distal pulses.  Exam reveals no gallop and no friction rub.   No murmur heard. Pulmonary/Chest: Effort normal. No respiratory distress. He has no wheezes. He has rales. He exhibits no tenderness.  Abdominal: Soft. Bowel sounds are normal. He exhibits no distension and no mass. There is no tenderness. There is no rebound and no guarding.  Significant visceral obesity present  Musculoskeletal: Normal range of motion. He exhibits no edema or tenderness.  Lymphadenopathy:    He has no cervical adenopathy.  Neurological: He is alert and oriented to person, place, and time. He has normal reflexes. No cranial nerve deficit. Coordination normal.  Skin: Skin is warm and dry. No rash noted. He is not diaphoretic. No erythema. No pallor.  Psychiatric: He has a normal mood and affect. His behavior is normal. Judgment and thought content normal.  Flat affect  Nursing note and vitals reviewed.  Vitals:   06/07/16 1120  BP: 104/64  Pulse: 65  SpO2: 94%  Weight: 224 lb 12.8 oz (102 kg)  Height: _0  (1.854 m)    Estimated body mass index is 29.66 kg/m as calculated from the following:   Height as of this encounter: _1  (1.854 m).   Weight as of this encounter: 224 lb 12.8 oz (102 kg).       Assessment:       ICD-9-CM ICD-10-CM   1. Dyspnea, unspecified type 786.09 R06.00 CT Chest High Resolution   Moderate to sever subjectivy    Plan:      Unclear how best to help your problem  Plan - repeat HRCT in April 2018/May 2018 for 1 year followup  - In interim try TRELEGY  inhaler sample  Followup  - return to see me in April/MAy 2018 after CT chest   Dr. Brand Males, M.D., [facility].C.P Pulmonary and Critical Care Medicine Staff Physician [place] [facility]monary and Critical Care Pager: [phone], If no answer or between  15:00h - 7:00h: call [phone]  06/07/2016 12:22 PM

## 2016-06-07 NOTE — Patient Instructions (Signed)
ICD-9-CM ICD-10-CM   1. Dyspnea, unspecified type 786.09 R06.00     Unclear how best to help your problem  Plan - repeat HRCT in April 2018/May 2018 for 1 year followup  - In interim try TRELEGY inhaler sample  Followup  - return to see me in April/MAy 2018 after CT chest

## 2016-06-15 DIAGNOSIS — R0689 Other abnormalities of breathing: Secondary | ICD-10-CM | POA: Diagnosis not present

## 2016-06-15 DIAGNOSIS — J8410 Pulmonary fibrosis, unspecified: Secondary | ICD-10-CM | POA: Diagnosis not present

## 2016-06-15 NOTE — Progress Notes (Signed)
CARDIOLOGY OFFICE NOTE  Date:  06/23/2016    [name] Date of Birth: October 14, 1931 Medical Record #[id number]  PCP:  Viviana Simpler, MD  Cardiologist:  Johnsie Cancel  No chief complaint on file.   History of Present Illness: [name] is a 83 y.o. male who presents today for a one month check.   Seen for Dr. Johnsie Cancel.   He has a past medical history of CAD (s/p CABG in 2001 with multiple PCI's since, known occluded SVG-RCA and SVG-D1), mild AS, HTN, HLD, DM and CKD (not on HD).   He was last seen here in the office at the end of March.   He presented to [name] ED on 08/13/2015 for evaluation of chest pain.   He reported having mid-epigastric pain occuring off and on for the past week which was occuring with exertion. It was relieved with rest and SL NTG. He has also noticed dyspnea with exertion. While in the ED, his initial troponin was elevated to 0.49 and EKG showed slight inferolateral ST depressions, new compared to prior EKG in 2016. CBC showed chronic renal insuffiencey with SCr at 1.56 which is his baseline.   With his presenting symptoms, EKG changes, and elevated troponin, a cardiac catheterization was recommended. He was admitted and started on Heparin with anticipation of a cardiac cath later that day.   His catheterization showed a patent LIMA-LAD and 99% stenosis noted in the SVG-OM. PCI with a DES was performed. CTO of SVG-RCA and SVG-D1 was noted. He will be on DAPT for at least 12 months and possibly indefinitely due to a history of multiple PCI procedures. He tolerated the procedure well and no complications were noted.  An echocardiogram was performed prior to discharge which showed a preserved EF of 55-60% with hypokinesis of the inferolateral myocardium. Grade 1 DD was noted along with mild AS, mild AI, mild MR and TR.   He was last examined by Dr. Acie Fredrickson and deemed stable for discharge. He will continue on ASA, BB, statin (changed to  high-intensity this admission), Plavix, and Imdur. Reports his Plavix was discontinued just one month ago and has a new bottle of the medication at home (Refills were sent to his pharmacy with instructions not to fill at this time). A 10-day TCM appointment was arranged prior to discharge.     I saw him last month in the [facility] and he was still having some symptoms but improved. We increased his Imdur.        Comes back today. Here alone today.  He says he is feeling better. He did have some headache - but this has improved. Not dizzy or lightheaded. No chest pain/belly pain. Breathing is stable. He is pleased with how he is doing.  Got an inhaler from Dr Chase Caller and this seems to have helped a lot   Past Medical History:  Diagnosis Date  . Arthritis    osteoarthritis, s/p R TKR, and digits  . CAD (coronary artery disease)    a. s/p CABG (2001)  b. s/p DES to RCA and cutting POBA to ostial PDA (2013)   c. s/p DES to SVG to OM2 (01/14/14) d. cath: 08/2015 NSTEMI w/ patent LIMA-LAD and 99% stenosis of SVG-OM w/ DES placed. CTO of SVG-RCA and SVG-D1.   [name] Chronic diastolic CHF (congestive heart failure) ([place])    a) 09/13 ECHO- LVEF 83-15%, grade 1 diastolic dysfunction, mild LA dilatation, atrial septal aneurysm, AV mobility restricted, but no  sig AS by doppler; b) 09/04/08 ECHO- LVH, ef 60%, mild AS, c. echo 08/2015: EF perserved of 55-60% with inferolateral HK. Mild AS noted.  . Chronic kidney disease, stage III (moderate)   . Chronic lower back pain   . Colon polyps   . Diverticulosis   . Dyspnea 2009 since July -Sept   05/06/08-CPST-  normal effort, reduced VO2 max 20.5 /65%, reduced at 8.2/ 40%, normal breathing resetvca of 55%, submaximal heart rate response 112/77%, flattened o2 pluse response at peak exercise-12 ml/beat @ 85%, No VQ mismatch abnormalities, All c/w CIRC Limitation  . Enlarged prostate   . Esophageal stricture    a. s/p dilation spring 2010  . GERD (gastroesophageal reflux  disease)   . Heart murmur   . Hiatal hernia   . History of PFTs    mixed pattern on spiro. mild restn on lung volumes with near normal DLCO. Pattern can be explained by CABG scar. Fev1 2.2L/73%, ratio 68 (67), TLC 4.7/68%,RV 1.5L/55%,DLCO 79%  . Hyperlipidemia   . Hypertension   . Interstitial lung disease ([name])    NOS  . Overweight (BMI 25.0-29.9)    BMI 29  . RA (rheumatoid arthritis) ([name])    [name]  . Seropositive rheumatoid arthritis ([place])   . Type II diabetes mellitus ([place])     Past Surgical History:  Procedure Laterality Date  . CARDIAC CATHETERIZATION  08/2004   CP- no MI, Cath- small vessell disease   . CARDIAC CATHETERIZATION  12/31/2011   80% distal LM, 100% native LAD, LCx and RCA, 30% prox SVG-OM, SVG-D1 normal, 99% distal, 80% ostial SVG-RCA distal to graft, LIMA-LAD normal; LVEF mildly decreased with posterior basal AK   . CARDIAC CATHETERIZATION  2009   with patent grafts/notes 12/31/2011  . CARDIAC CATHETERIZATION N/A 08/13/2015   Procedure: Left Heart Cath and Cors/Grafts Angiography;  Surgeon: Sherren Mocha, MD;  Location: [place] CV LAB;  Service: Cardiovascular;  Laterality: N/A;  . CATARACT EXTRACTION W/ INTRAOCULAR LENS  IMPLANT, BILATERAL Bilateral   . CHOLECYSTECTOMY OPEN  11/2003   [name]  . CORONARY ANGIOPLASTY WITH STENT PLACEMENT  01/03/2012   Successful DES to SVG-RCA and cutting balloon angioplasty ostial  PDA   . CORONARY ANGIOPLASTY WITH STENT PLACEMENT  01/14/2014   "1"  . CORONARY ARTERY BYPASS GRAFT  11/1999   CABG X5  . CORONARY STENT PLACEMENT  02/2012   1 stent and balloon  . ESOPHAGOGASTRODUODENOSCOPY (EGD) WITH ESOPHAGEAL DILATION  2010  . HAND SURGERY    . JOINT REPLACEMENT    . KNEE ARTHROSCOPY Right 2008  . LEFT AND RIGHT HEART CATHETERIZATION WITH CORONARY ANGIOGRAM  12/31/2011   Procedure: LEFT AND RIGHT HEART CATHETERIZATION WITH CORONARY ANGIOGRAM;  Surgeon: Burnell Blanks, MD;  Location: [facility] CATH LAB;  Service:  Cardiovascular;;  . LEFT AND RIGHT HEART CATHETERIZATION WITH CORONARY ANGIOGRAM N/A 01/14/2014   Procedure: LEFT AND RIGHT HEART CATHETERIZATION WITH CORONARY ANGIOGRAM;  Surgeon: Mervil Wacker M Martinique, MD;  Location: [facility] CATH LAB;  Service: Cardiovascular;  Laterality: N/A;  . PERCUTANEOUS CORONARY INTERVENTION-BALLOON ONLY  01/03/2012   Procedure: PERCUTANEOUS CORONARY INTERVENTION-BALLOON ONLY;  Surgeon: Dyshon Philbin M Martinique, MD;  Location: [facility] CATH LAB;  Service: Cardiovascular;;  . PERCUTANEOUS CORONARY STENT INTERVENTION (PCI-S)  12/31/2011   Procedure: PERCUTANEOUS CORONARY STENT INTERVENTION (PCI-S);  Surgeon: Burnell Blanks, MD;  Location: [facility] CATH LAB;  Service: Cardiovascular;;  . PERCUTANEOUS CORONARY STENT INTERVENTION (PCI-S) N/A 01/03/2012   Procedure: PERCUTANEOUS CORONARY STENT INTERVENTION (PCI-S);  Surgeon: Neeko Pharo M Martinique, MD;  Location: [facility] CATH LAB;  Service: Cardiovascular;  Laterality: N/A;  . SHOULDER ARTHROSCOPY WITH OPEN ROTATOR CUFF REPAIR AND DISTAL CLAVICLE ACROMINECTOMY Left 02/27/2013   Procedure: LEFT SHOULDER ARTHROSCOPY WITH MINI OPEN ROTATOR CUFF REPAIR AND SUBACROMIAL DECOMPRESSION AND DISTAL CLAVICLE RESECTION;  Surgeon: [name], MD;  Location: [place];  Service: Orthopedics;  Laterality: Left;  . TOTAL KNEE ARTHROPLASTY Right 03/2010   [name]  . TRIGGER FINGER RELEASE Left 02/27/2013   Procedure: RELEASE TRIGGER FINGER/A-1 PULLEY;  Surgeon: [name], MD;  Location: [place];  Service: Orthopedics;  Laterality: Left;     Medications: Current Outpatient Prescriptions  Medication Sig Dispense Refill  . amLODipine (NORVASC) 10 MG tablet TAKE 1 TABLET BY MOUTH DAILY 90 tablet 3  . aspirin EC 81 MG tablet Take 81 mg by mouth at bedtime.    [name] atorvastatin (LIPITOR) 80 MG tablet Take 1 tablet (80 mg total) by mouth daily at 6 PM. 30 tablet 11  . azaTHIOprine (IMURAN) 50 MG tablet Take 100 mg by mouth 2 (two) times daily.     . Blood Glucose Monitoring  Suppl (ONE TOUCH ULTRA 2) w/Device KIT Use to obtain blood sugar daily. Dx Code E11.40 1 each 0  . carvedilol (COREG) 12.5 MG tablet TAKE 1 TABLET BY MOUTH TWICE A DAY WITH MEALS 60 tablet 11  . clopidogrel (PLAVIX) 75 MG tablet Take 1 tablet (75 mg total) by mouth daily with breakfast. 30 tablet 11  . DHA-Vitamin C-Lutein (EYE HEALTH FORMULA PO) Take 1 tablet by mouth daily.     . Fluticasone-Umeclidin-Vilant (TRELEGY ELLIPTA) 100-62.5-25 MCG/INH AEPB Inhale 1 puff into the lungs daily. 2 each 0  . furosemide (LASIX) 40 MG tablet TAKE 1 OR 2 TABLETS BY MOUTH DAILY 60 tablet 10  . glucose blood (ONE TOUCH ULTRA TEST) test strip USE TO CHECK BLOOD SUGAR ONCE A DAY Dx Code E11.40 100 each 3  . isosorbide mononitrate (IMDUR) 30 MG 24 hr tablet Take 1 tablet (30 mg total) by mouth daily. 90 tablet 3  . lidocaine (XYLOCAINE) 5 % ointment Apply 1 application topically 2 (two) times daily as needed. 35.44 g 5  . losartan (COZAAR) 100 MG tablet TAKE 1 TABLET BY MOUTH DAILY 90 tablet 3  . metFORMIN (GLUCOPHAGE) 1000 MG tablet TAKE 1 TABLET BY MOUTH TWICE A DAY 60 tablet 10  . Multiple Vitamin (MULTIVITAMIN WITH MINERALS) TABS tablet Take 1 tablet by mouth daily.    . nitroGLYCERIN (NITROSTAT) 0.4 MG SL tablet DISSOLVE 1 TABLET UNDER THE TONGUE FOR CHEST PAIN. MAY REPEAT EVERY 5MINUTES UP TO 3 DOSES. IF NO RELIEF, CALL 911** 25 tablet 5  . ONETOUCH DELICA LANCETS 41Y MISC     . pregabalin (LYRICA) 100 MG capsule Take 100 mg by mouth 2 (two) times daily.     No current facility-administered medications for this visit.     Allergies: Allergies  Allergen Reactions  . Doxazosin Mesylate Other (See Comments)    dizziness  . Methocarbamol Rash    Social History: The patient  reports that he quit smoking about 53 years ago. His smoking use included Cigarettes. He has a 20.00 pack-year smoking history. He has never used smokeless tobacco. He reports that he does not drink alcohol or use drugs.   Family  History: The patient's family history includes Alcohol abuse in his sister; COPD in his mother; Colon cancer (age of onset: 2) in his brother; Diabetes in his  brother; Heart attack in his father; Heart disease in his father; Stroke in his sister.   Review of Systems: Please see the history of present illness.   Otherwise, the review of systems is positive for none.   All other systems are reviewed and negative.   Physical Exam: VS:  BP 130/70   Pulse 67   Ht '6\' 1"'$  (1.854 m)   Wt 220 lb 12 oz (100.1 kg)   SpO2 95%   BMI 29.12 kg/m  .  BMI Body mass index is 29.12 kg/m.  Wt Readings from Last 3 Encounters:  06/23/16 220 lb 12 oz (100.1 kg)  06/07/16 224 lb 12.8 oz (102 kg)  05/14/16 214 lb 4 oz (97.2 kg)   BP 130/70   Pulse 67   Ht '6\' 1"'$  (1.854 m)   Wt 220 lb 12 oz (100.1 kg)   SpO2 95%   BMI 29.12 kg/m   Affect appropriate Chronically ill pale white male  HEENT: normal Neck supple with no adenopathy JVP normal no bruits no thyromegaly Lungs basilar crackles bilateral  no wheezing and good diaphragmatic motion Heart:  S1/S2 AS  murmur, no rub, gallop or click PMI normal Abdomen: benighn, BS positve, no tenderness, no AAA no bruit.  No HSM or HJR Distal pulses intact with no bruits Trace bilateral  edema Neuro non-focal Skin warm and dry No muscular weakness   LABORATORY DATA:  EKG:   08/25/15 SR rate 66 inferior lateral T wave inversions   Lab Results  Component Value Date   WBC 9.0 05/11/2016   HGB 10.9 (L) 02/18/2016   HCT 29.4 (L) 05/11/2016   PLT 249 05/11/2016   GLUCOSE 127 (H) 05/11/2016   CHOL 145 08/14/2015   TRIG 148 08/14/2015   HDL 27 (L) 08/14/2015   LDLCALC 88 08/14/2015   ALT 12 05/11/2016   AST 17 05/11/2016   NA 140 05/11/2016   K 4.6 05/11/2016   CL 100 05/11/2016   CREATININE 1.97 (H) 05/11/2016   BUN 36 (H) 05/11/2016   CO2 30 (H) 05/11/2016   TSH 2.51 08/17/2011   PSA 1.83 02/17/2009   INR 1.12 08/13/2015   HGBA1C 7.0 (H)  04/08/2016   MICROALBUR 0.5 02/04/2009    BNP (last 3 results)  Recent Labs  08/13/15 1829  BNP 318.4*    ProBNP (last 3 results) No results for input(s): PROBNP in the last 8760 hours.   Other Studies Reviewed Today:  Echo Study Conclusions from 08/2015  - Left ventricle: The cavity size was normal. Wall thickness was  increased in a pattern of mild LVH. Systolic function was normal.  The estimated ejection fraction was in the range of 55% to 60%.  There is hypokinesis of the inferolateral myocardium. Doppler  parameters are consistent with abnormal left ventricular  relaxation (grade 1 diastolic dysfunction). Doppler parameters  are consistent with high ventricular filling pressure. - Aortic valve: Valve mobility was restricted. There was mild  stenosis. There was mild regurgitation. Valve area (VTI): 1.87  cm^2. Valve area (Vmax): 2.08 cm^2. Valve area (Vmean): 1.9 cm^2. - Mitral valve: Calcified annulus. Mildly thickened leaflets .  There was mild regurgitation. - Left atrium: The atrium was mildly dilated. - Pulmonary arteries: Systolic pressure was mildly increased. PA  peak pressure: 36 mm Hg (S).  Impressions:  - Hypokinesis of the inferior lateral wall with overall preserved  LV function; grade 1 diastolic dysfunction with elevated LV  filling pressure; mild LAE; calcified aortic valve with  mild AS  and mild AI; mild MR and TR; mildly elevated pulmonary pressure.  Procedures 08/13/15    Left Heart Cath and Cors/Grafts Angiography    Conclusion     Prox RCA lesion, 100% stenosed.  LIMA .  The LIMA to LAD graft is widely patent without stenosis.  Sequential SVG .  The sequential saphenous vein graft to OM1 and OM 2 has critical stenosis within the previously stented segment in the proximal body of the graft.  SVG .  Graft known to be totally occluded, not selectively injected  Origin lesion, 100% stenosed.  SVG .  The  graft is chronically occluded  Prox Graft lesion, 100% stenosed.  Prox LAD lesion, 100% stenosed.  Ost LM lesion, 80% stenosed.  Ost 2nd Mrg to 2nd Mrg lesion, 90% stenosed.  Prox Graft to Mid Graft lesion, before 1st Mrg, 99% stenosed. Post intervention, there is a 0% residual stenosis. The lesion was previously treated with a stent (unknown type).  1. Severe native three-vessel coronary artery disease with severe left main stenosis, total occlusion of the LAD, total occlusion of the RCA, and severe native left circumflex stenosis  2. Status post aortocoronary bypass surgery with continued patency of the LIMA to LAD and continued patency of the saphenous vein graft to OM with severe stenosis in the proximal body of the graft (in-stent)  3. Chronic total occlusion of the saphenous graft RCA and saphenous vein graft to diagonal  4. Successful PCI of the saphenous vein graft OM with placement of a drug-eluting stent(treatment of in-stent restenosis)  Recommendation: dual antiplatelet therapy with aspirin and Plavix at least 12 months. Consider long-term therapy if tolerated in this patient with previous bypass grafting who has required multiple PCI procedures.         Assessment/Plan: 1. Extensive CAD with last  PCI  May 2017 - committed to DAPT - symptoms now resolved with current therapy. Cath 08/13/2015 DES SVG OM   2. HTN - BP recheck by me looks fine - ok on current regimen  3. HLD - now on high dose statin - LDL 88 08/14/15  LFTls normal 09/09/15   4. Chronic dyspnea - using Oxygen at night - follows with pulmonary as well. Did not Think right heart cath indicated as echo suggests PA pressures only  36 mmHg CT High res done 07/08/15 with bilateral lower lobe plate-like ground glass and bronchiectasis exam is abnormal with ILD like velcro crackles at base Echo 01/2016 Estimated PA 46 mmhg stable Improvement with inhaler speaks to pulmonary nature of symptoms f/u with Dr  Chase Caller   5. Mild AS - would follow. Mean gradient 12 mmHg peak 26 mmHg  F/u echo 01/2017  7. Anemia - f/u primary no active bleeding noted   8. CKD -  Baseline Cr close to 3 f/u nephrology          Current medicines are reviewed with the patient today.  The patient does not have concerns regarding medicines other than what has been noted above.  The following changes have been made:  See above.  Labs/ tests ordered today include:    Orders Placed This Encounter  Procedures  . ECHOCARDIOGRAM COMPLETE     [name]

## 2016-06-16 ENCOUNTER — Encounter: Payer: Self-pay | Admitting: Podiatry

## 2016-06-16 ENCOUNTER — Ambulatory Visit (INDEPENDENT_AMBULATORY_CARE_PROVIDER_SITE_OTHER): Payer: Medicare Other

## 2016-06-16 ENCOUNTER — Ambulatory Visit (INDEPENDENT_AMBULATORY_CARE_PROVIDER_SITE_OTHER): Payer: Medicare Other | Admitting: Podiatry

## 2016-06-16 DIAGNOSIS — M79671 Pain in right foot: Secondary | ICD-10-CM

## 2016-06-16 DIAGNOSIS — M779 Enthesopathy, unspecified: Secondary | ICD-10-CM

## 2016-06-16 DIAGNOSIS — M79672 Pain in left foot: Secondary | ICD-10-CM | POA: Diagnosis not present

## 2016-06-16 MED ORDER — TRIAMCINOLONE ACETONIDE 10 MG/ML IJ SUSP
10.0000 mg | Freq: Once | INTRAMUSCULAR | Status: AC
  Administered 2016-06-16: 10 mg

## 2016-06-17 NOTE — Progress Notes (Signed)
Subjective:     Patient ID: [name], male   DOB: 12/16/1931, 83 y.o.   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

## 2016-06-23 ENCOUNTER — Ambulatory Visit (INDEPENDENT_AMBULATORY_CARE_PROVIDER_SITE_OTHER): Payer: Medicare Other | Admitting: Cardiovascular Disease

## 2016-06-23 ENCOUNTER — Encounter: Payer: Self-pay | Admitting: Cardiovascular Disease

## 2016-06-23 ENCOUNTER — Encounter (INDEPENDENT_AMBULATORY_CARE_PROVIDER_SITE_OTHER): Payer: Self-pay

## 2016-06-23 VITALS — BP 130/70 | HR 67 | Ht 73.0 in | Wt 220.8 lb

## 2016-06-23 DIAGNOSIS — I350 Nonrheumatic aortic (valve) stenosis: Secondary | ICD-10-CM | POA: Diagnosis not present

## 2016-06-23 NOTE — Patient Instructions (Addendum)
Medication Instructions:  Your physician recommends that you continue on your current medications as directed. Please refer to the Current Medication list given to you today.  Labwork: NONE  Testing/Procedures: Your physician has requested that you have an echocardiogram in October. Echocardiography is a painless test that uses sound waves to create images of your heart. It provides your doctor with information about the size and shape of your heart and how well your heart's chambers and valves are working. This procedure takes approximately one hour. There are no restrictions for this procedure.  Follow-Up: Your physician wants you to follow-up in: 6 months with Dr. Nishan. You will receive a reminder letter in the mail two months in advance. If you don't receive a letter, please call our office to schedule the follow-up appointment.   If you need a refill on your cardiac medications before your next appointment, please call your pharmacy.    

## 2016-07-05 ENCOUNTER — Other Ambulatory Visit: Payer: Self-pay | Admitting: Neurology

## 2016-07-06 ENCOUNTER — Other Ambulatory Visit: Payer: Self-pay | Admitting: *Deleted

## 2016-07-06 MED ORDER — PREGABALIN 100 MG PO CAPS: 100.0000 mg | ORAL_CAPSULE | Freq: Two times a day (BID) | ORAL | 5 refills | Status: DC

## 2016-07-06 NOTE — Telephone Encounter (Signed)
Rx sent 

## 2016-07-08 ENCOUNTER — Ambulatory Visit (INDEPENDENT_AMBULATORY_CARE_PROVIDER_SITE_OTHER)
Admission: RE | Admit: 2016-07-08 | Discharge: 2016-07-08 | Disposition: A | Discharge status: Home / Self Care | Payer: Medicare Other | Source: Ambulatory Visit | Attending: Internal Medicine | Admitting: Internal Medicine

## 2016-07-08 DIAGNOSIS — R0600 Dyspnea, unspecified: Secondary | ICD-10-CM

## 2016-07-08 DIAGNOSIS — J479 Bronchiectasis, uncomplicated: Secondary | ICD-10-CM | POA: Diagnosis not present

## 2016-07-14 ENCOUNTER — Telehealth: Payer: Self-pay | Admitting: Internal Medicine

## 2016-07-14 MED ORDER — FLUTICASONE-UMECLIDIN-VILANT 100-62.5-25 MCG/INH IN AEPB: 1.0000 | INHALATION_SPRAY | Freq: Every day | RESPIRATORY_TRACT | 0 refills | Status: DC

## 2016-07-14 NOTE — Telephone Encounter (Signed)
Spoke with pt and he likes the Trelegy and he requested more samples. These have been placed up front for pick up. Pt is aware to bring ID. Nothing further is needed

## 2016-07-16 DIAGNOSIS — R0689 Other abnormalities of breathing: Secondary | ICD-10-CM | POA: Diagnosis not present

## 2016-07-16 DIAGNOSIS — J8410 Pulmonary fibrosis, unspecified: Secondary | ICD-10-CM | POA: Diagnosis not present

## 2016-07-21 DIAGNOSIS — H35371 Puckering of macula, right eye: Secondary | ICD-10-CM | POA: Diagnosis not present

## 2016-07-21 DIAGNOSIS — H43813 Vitreous degeneration, bilateral: Secondary | ICD-10-CM | POA: Diagnosis not present

## 2016-07-21 DIAGNOSIS — H353213 Exudative age-related macular degeneration, right eye, with inactive scar: Secondary | ICD-10-CM | POA: Diagnosis not present

## 2016-07-21 DIAGNOSIS — H353123 Nonexudative age-related macular degeneration, left eye, advanced atrophic without subfoveal involvement: Secondary | ICD-10-CM | POA: Diagnosis not present

## 2016-07-21 LAB — HM DIABETES EYE EXAM

## 2016-08-02 ENCOUNTER — Telehealth: Payer: Self-pay | Admitting: Rheumatology

## 2016-08-02 DIAGNOSIS — Z79899 Other long term (current) drug therapy: Secondary | ICD-10-CM

## 2016-08-02 MED ORDER — AZATHIOPRINE 50 MG PO TABS: 100.0000 mg | ORAL_TABLET | Freq: Two times a day (BID) | ORAL | 0 refills | Status: DC

## 2016-08-02 NOTE — Telephone Encounter (Signed)
[name] with Pharmacy calling to advise Patients Rx for Aza Thioprine had 180 tabs left on rx, but rx date had expired. Pharmacy reissued the Rx for patient so he could use the remaining quantity. Please call if this is not okay.

## 2016-08-02 NOTE — Telephone Encounter (Signed)
He is past due for follow up  Message sent to appt scheduling to call him Last visit 01/06/16 CBC CMP 05/11/16 c/w previous He was also due for labs in April  Ok to refill one month supply  per Dr Estanislado Pandy   I called patient to advise the labs and follow up are past due. His memory is full on his voice mail   I have called pharmacy to advise. Patient is there now, and they will advise him to come here for lab and make follow up

## 2016-08-03 ENCOUNTER — Ambulatory Visit (INDEPENDENT_AMBULATORY_CARE_PROVIDER_SITE_OTHER): Payer: Medicare Other | Admitting: Rheumatology

## 2016-08-03 ENCOUNTER — Encounter: Payer: Self-pay | Admitting: Internal Medicine

## 2016-08-03 ENCOUNTER — Encounter: Payer: Self-pay | Admitting: Rheumatology

## 2016-08-03 ENCOUNTER — Ambulatory Visit (INDEPENDENT_AMBULATORY_CARE_PROVIDER_SITE_OTHER): Payer: Medicare Other | Admitting: Internal Medicine

## 2016-08-03 ENCOUNTER — Other Ambulatory Visit: Payer: Self-pay | Admitting: Radiology

## 2016-08-03 ENCOUNTER — Telehealth (INDEPENDENT_AMBULATORY_CARE_PROVIDER_SITE_OTHER): Payer: Self-pay | Admitting: Physical Medicine and Rehabilitation

## 2016-08-03 ENCOUNTER — Telehealth: Payer: Self-pay | Admitting: Internal Medicine

## 2016-08-03 VITALS — BP 92/45 | HR 64 | Resp 14 | Ht 71.0 in | Wt 225.0 lb

## 2016-08-03 DIAGNOSIS — Z79899 Other long term (current) drug therapy: Secondary | ICD-10-CM

## 2016-08-03 DIAGNOSIS — N183 Chronic kidney disease, stage 3 (moderate): Secondary | ICD-10-CM

## 2016-08-03 DIAGNOSIS — E1122 Type 2 diabetes mellitus with diabetic chronic kidney disease: Secondary | ICD-10-CM | POA: Diagnosis not present

## 2016-08-03 DIAGNOSIS — M059 Rheumatoid arthritis with rheumatoid factor, unspecified: Secondary | ICD-10-CM

## 2016-08-03 DIAGNOSIS — J849 Interstitial pulmonary disease, unspecified: Secondary | ICD-10-CM | POA: Diagnosis not present

## 2016-08-03 LAB — COMPLETE METABOLIC PANEL WITH GFR
ALT: 7 U/L — ABNORMAL LOW (ref 9–46)
AST: 13 U/L (ref 10–35)
Albumin: 3.6 g/dL (ref 3.6–5.1)
Alkaline Phosphatase: 58 U/L (ref 40–115)
BUN: 27 mg/dL — ABNORMAL HIGH (ref 7–25)
CHLORIDE: 100 mmol/L (ref 98–110)
CO2: 27 mmol/L (ref 20–31)
Calcium: 8.9 mg/dL (ref 8.6–10.3)
Creat: 2.29 mg/dL — ABNORMAL HIGH (ref 0.70–1.11)
GFR, EST AFRICAN AMERICAN: 30 mL/min — AB (ref >=60)
GFR, EST NON AFRICAN AMERICAN: 26 mL/min — AB (ref >=60)
Glucose, Bld: 222 mg/dL — ABNORMAL HIGH (ref 65–99)
POTASSIUM: 4 mmol/L (ref 3.5–5.3)
Sodium: 140 mmol/L (ref 135–146)
Total Bilirubin: 0.5 mg/dL (ref 0.2–1.2)
Total Protein: 6.9 g/dL (ref 6.1–8.1)

## 2016-08-03 LAB — CBC WITH DIFFERENTIAL/PLATELET
BASOS ABS: 0 {cells}/uL (ref 0–200)
BASOS PCT: 0 %
EOS ABS: 360 {cells}/uL (ref 15–500)
EOS PCT: 5 %
HCT: 27.5 % — ABNORMAL LOW (ref 38.5–50.0)
Hemoglobin: 8.7 g/dL — ABNORMAL LOW (ref 13.2–17.1)
LYMPHS PCT: 22 %
Lymphs Abs: 1584 cells/uL (ref 850–3900)
MCH: 24.7 pg — AB (ref 27.0–33.0)
MCHC: 31.6 g/dL — ABNORMAL LOW (ref 32.0–36.0)
MCV: 78.1 fL — AB (ref 80.0–100.0)
MONOS PCT: 7 %
MPV: 10.6 fL (ref 7.5–12.5)
Monocytes Absolute: 504 cells/uL (ref 200–950)
Neutro Abs: 4752 cells/uL (ref 1500–7800)
Neutrophils Relative %: 66 %
PLATELETS: 237 10*3/uL (ref 140–400)
RBC: 3.52 MIL/uL — ABNORMAL LOW (ref 4.20–5.80)
RDW: 17.4 % — AB (ref 11.0–15.0)
WBC: 7.2 10*3/uL (ref 3.8–10.8)

## 2016-08-03 MED ORDER — GLYCOPYRROLATE-FORMOTEROL 9-4.8 MCG/ACT IN AERO: 2.0000 | INHALATION_SPRAY | Freq: Two times a day (BID) | RESPIRATORY_TRACT | 0 refills | Status: DC

## 2016-08-03 MED ORDER — AZATHIOPRINE 50 MG PO TABS
50.0000 mg | ORAL_TABLET | Freq: Two times a day (BID) | ORAL | 0 refills | Status: DC
Start: 2016-08-03 — End: 2016-08-13

## 2016-08-03 NOTE — Addendum Note (Signed)
Addended by: Collier Salina on: 08/03/2016 12:39 PM   Modules accepted: Orders

## 2016-08-03 NOTE — Assessment & Plan Note (Signed)
Overall CT and PFT stable since 2015 Too bad trelegy inhaler did not sustain effect in helping your shortness of breath Frustrating you have disabling shortness of breath Options are   - Consider right heart cath - please d/w Dr Johnsie Cancel when you see him  - I have written to Dr Estanislado Pandy to see if there is any utility in cellcept which was recommended at [facility] in 2016  Meanwhile will give you sample bevespi to see if this can help airtrapping  Followup 6 months or sooner if needed

## 2016-08-03 NOTE — Telephone Encounter (Signed)
Hi  [name]  In 2016 [facility] recommended cellcept for his mildILD givenhis refractory dyspnea even though as of 08/03/2016 - ILD is stable on CT/PFT. He has CKD-3. Thoughts on cellcept? He is 61 but desperate about his dyspnea  Thanks  Dr. Brand Males, M.D., [facility].C.P Pulmonary and Critical Care Medicine Staff Physician [facility] Pager: [phone], If no answer or between  15:00h - 7:00h: call [phone]  08/03/2016 12:28 PM

## 2016-08-03 NOTE — Patient Instructions (Signed)
ILD (interstitial lung disease) Overall CT and PFT stable since 2015 Too bad trelegy inhaler did not sustain effect in helping your shortness of breath Frustrating you have disabling shortness of breath Options are   - Consider right heart cath - please d/w Dr Johnsie Cancel when you see him  - I have written to Dr Estanislado Pandy to see if there is any utility in cellcept which was recommended at [facility] in 2016  Meanwhile will give you sample bevespi to see if this can help airtrapping  Followup 6 months or sooner if needed

## 2016-08-03 NOTE — Telephone Encounter (Signed)
[name] has advised the Rx for Imuran that was documented yesterday was in error. I documented 2 bid in the Rx module when I spoke to pharmacy. I have called the pharmacy, and they have been filling one bid, it was just documented in our system incorrectly.   [name] is aware of this is error in chart and I have now corrected it.  Thank you for letting me know. It has been corrected in the RX module   Caroly Purewal states she did speak to you today, and the error did not reach the patient, Rx was filled correctly.

## 2016-08-03 NOTE — Progress Notes (Signed)
Subjective:     Patient ID: [name], male   DOB: October 15, 1931, 83 y.o.   MRN: [id number]  HPI   83 yo male former smoker with RA-ILD   TEST     - Coronary artery disease. s/p CABG x 5 in 2002.  Dyspnea started following lopressor Jan-July 2009 and lisinopril increae July 2009. No relief despite stopping agents July 2010  -   Myoview on April 17, 2007 was  nonischemic with an EF of 51% and inferobasal wall scar.   - CAth Oct 2009:    - . Severe native three-vessel coronary artery disease. 2. Status post multivessel coronary bypass surgery with all grafts  patent. Normal LVEF and LVEDP - PFT - Mixed pattern on spiro. Mild restn on lung volumeswith near normal DLCO.   - Fev1 2.2L/73%, FVC 3.21L/70%, Ratio 68 (67),  TLC 4.7L/68%, RV 1.5L/55%, DLCO 79%.  -CPST 05/06/2008: Normal effot.    - Reduced VO2 max 20.5/65%, REduced AT 8.2/40%, Normal breathing reservce of 55%, submaximal heart rate response 112/77%,  flattened O2 pulse response at peak exercise - 85m/beat at 85%. No VQ mismatch abnormalities. - ECHO 09/04/2008: LVH, ef 60%, mild AS and unchanged from prior   - CT chest   - Non specific Interstitial Lung disease NOS. Stable pulm infilratates RLL ggo > LLL ggo. 03/2008 -> 10/2008 -> June 2011: stable - Rehab: never attended 2009-2013 due to cost co.  2013 Walking desat test : resting 98/94% -> 3 laps x 185 feet; HR 113/pulse ox 96%    PFTs July 2013 show worsening restriction since 2010 along with reduced diffusion. The RLL baseline GGO might be worse in July 2013 compared to 2011. In addition, autommune profile shows VERY HIGH TITERS of CCP antibody > 300 which is pathognomonic of rheumtoid arthritis lung involvement. RF is only borderline elevated a 23  PFT 10/12/11>>FEV1 2.02 (70%), Fvc 2.8/72%, TLC 4.07 (59%), DLCO 62%, no BD: RESTRICTION - WORSE since 2010 -(2010:  Fev1 2.2L/73%, FVC 3.21L/70%, Ratio 68 (67),  TLC 4.7L/68%, RV 1.5L/55%, DLCO 79%)   Echo 05/04/11>>mild LVH, EF  55 to 640% grade 1 diastolic dysfx, mild AS, mild MR, PAS 34 mmHg  CT 11/02/11 - calcification suspicious for aortic valve and some GGO esp RLL > LLL (? RLL worse compared to 2011)   [place]: dLM 80%, LAD 100%, circumflex 100%, RCA 100%, proximal SVG-OM 30%, SVG-D1 ok, SVG-RCA 99% distal and 80% ostial PDA distal to graft, LIMA-LAD ok with some left to right collaterals.  PCI 01/03/12: Promus DES to the SVG-RCA and Cutting Balloon angioplasty to the ostial PDA.  Echocardiogram 12/31/11: EF 598-11% grade 1 diastolic dysfunction, mild aortic stenosis, mean gradient 9, mild LAE.   Walking desaturation test on 05/05/2012 185 feet x 3 laps:  did NOT desaturate. Rest pulse ox was 98%, final pulse ox was 96%. HR response 69/min at rest to 82/min at peak exertion.   PFT FVC fev1 ratio BD fev1 TLC DLCO comment intervention  2010 3.2L/70% 2.2L/73%   4.7L/68% 79%    10/12/11 2.8L/72% 2.02L/70% 72  4.07L/59% 62% Worsening restriction since 2010 Rheum will start immuran/pred. Also had PCI in nov 2013  04/28/12 2.6L/59% 1.77L/62% 67  4.0L/59% 15.7/71%     '2015 PFT showed no significant change with FEV1 56% , ratio 66,  FVC 61%.   07/31/2015 Follow up : ILD ? RA related  Pt returns for follow up . Has been have more DOE.  Was set up for  a CT chest  PFT on 4/25 showed FEV1 57%, ratio 71, FVC 58%, no sign BD response , + BD in midflows.  DLCO 40%. (this is decreased from 2015- was 59%.  TLC is similar to 2015 . Has been started on Imuran for RA ILD .  CT chest did not show any sgin change , cont Bilateral LL GGO and bronchiectasis .  Remains on O2 2l/m  At bedtime  . ONO showed desats on RA , O2 was continued.  Has been seen by [facility] last year. Now on Imuran, feels Arthritis pain is better.  Says he is feels he is doing okay, gets winded with walking long distances.  Can walk 1/2 block before stopping.  Has dry cough some days.  PVX and Prevnar 13 are utd.   Followed by Dr. Estil Daft in Rheum. Remains on  Imuran. Most pain is in his hands.   Denies chest pain, orthopnea , edema or fever.    OV 12/09/2015   Chief Complaint  Patient presents with  . Follow-up    Feels about the same since the last time we saw him, cough, with some mucus    Follow-up refractory dyspnea in the setting of mild interstitial lung disease from rheumatoid arthritis and coronary artery disease and multifactorial reasons. Refractory to pulmonary rehabilitation.  He did see my nurse practitioner April 2017 and CT chest did not show any changes to his bilateral lower lobe groundglass opacities and bronchiectasis. He is being maintained on Imuran for his rheumatoid arthritis. He is on oxygen 2 L at bedtime.     Had echocardiogram May 2017 that shows elevated pulmonary artery systolic pressure, mild aortic stenosis and diastolic dysfunction. On 08/13/2015 he underwent left heart catheterization by Dr. Sherren Mocha that shows severe native three-vessel coronary artery disease status post bypass with continued patency of the LIMA to LAD and continued patency of the saphenous vein graft to OM with severe stenosis in the proximal body of the graft for which he had successful PCI of a drug-eluting stent treatment of in-stent restenosis. Recommendation is dual antiplatelet therapy lifelong.   He is with his wife currently. He says dyspnea is only some better. He still has significant dyspnea and faitgues more than his wife.   OV 06/07/2016  Chief Complaint  Patient presents with  . Follow-up    Pt feels like his breathing has not been doing good, pt c/o coughing more with greyish colored mucus, more increase sob with exertion, has some chest tightnes, does has some congestion Denies fever   Follow-up refractory dyspnea in the setting of rheumatoid arthritis andand coronary artery disease. Multifactorial dyspnea. On Imuran for rheumatoid arthritis oxygen 2 L at bedtime. He is refractory to pulmonary rehabilitation. He is  believed to have some interstitial lung disease but April 2017 CT chest suggested only mild bronchiectasis  Last seen in September 2017 and at that time as dyspnea with some better after having a cardiac stent. I discussed with his cardiologist about the possibility of having right heart catheterization but it was felt that the elevation in pulmonary artery pressure was not significant enough on the echocardiogram to warrant this. He tells me that he is deteriorated now and this shortness of breath is even worse. He has tried Symbicort in the past without much help. He is willing to try "anything" to help his dyspnea. Walking desaturation test on 06/07/2016 185 feet x 3 laps on RA:  did NOT desaturate. Rest pulse ox was 97%,  final pulse ox was 95%. Did only 2 laps and stopped in interim x 2. . HR response 64/min at rest to 70/min at peak exertion. He says he stopped due to dyspnea and chest pain - like always       OV 08/03/2016  Chief Complaint  Patient presents with  . Follow-up    Pt here after HRCT. Pt states he tried the trelegy and states it helped his breathing. Pt denies cough, CP/tightness, and f/c/s.     Refractory dyspnea in this gentleman with cardiac disease and also mixed obstructive restrictive spirometry due to air trapping and bronchiectasis and some groundglass.  Last visit I tried him on a sample of triple inhaler therapy. He says that this only worked temporarily and stop working. Nevertheless he wants another inhaler. He is frustrated by his dyspnea. He and his wife are making for relief from dyspnea. In 2016 he visited [facility] and they were considering switching his Imuran to CellCept in order to improve his pulmonary parenchymal problems. He is open to that. However he is not sure he needs follow-up with [facility] because Dr Deeann Dowse left the practice there. The suggestion of mild elevation in pulmonary artery systolic pressures and a previous echocardiogram but it  was not significant enough to warrant cardiology to do right heart catheterization. He is willing to have right heart catheterization if recommended. Overall frustrated. Pulm function test does not show any change in 2 years. And CT scan itself in 2018 appears to be stable   Results for [name], [name] (MRN [id number]) as of 08/03/2016 12:17  Ref. Range 09/18/2013 11:28 07/29/2015 11:12  FEV1-Pre Latest Units: L 1.69 1.67  FEV1-%Pred-Pre Latest Units: % 56 52  Pre FEV1/FVC ratio Latest Units: % 66 66  Results for [name], [name] (MRN [id number]) as of 08/03/2016 12:17  Ref. Range 09/18/2013 11:28 07/29/2015 11:12  TLC Latest Units: L 4.70 4.81  TLC % pred Latest Units: % 64 62  Results for [name], [name] (MRN [id number]) as of 08/03/2016 12:17  Ref. Range 09/18/2013 11:28 07/29/2015 11:12  DLCO unc Latest Units: ml/min/mmHg 19.91 14.87  DLCO unc % pred Latest Units: % 59 40   IMPRESSION: 1. Stable mild-to-moderate cylindrical and varicoid bronchiectasis in the bilateral dependent lower lobes. 2. Continued stability of parenchymal banding with associated subpleural reticulation and ground-glass attenuation at the areas of bronchiectasis in the dependent and basilar lower lobes, most consistent with postinfectious/postinflammatory scarring. No findings to suggest interstitial lung disease. 3. Stable smooth pleural thickening and calcification in the dependent basilar right pleural space, most compatible with chronic postinflammatory change. No pleural effusions. 4. Stable mild patchy air trapping in both lungs, suggesting small airways disease. 5. Aortic atherosclerosis. Left main and 3 vessel coronary atherosclerosis status post CABG.   Electronically Signed   By: Ilona Sorrel M.D.   On: 07/08/2016 14:11   has a past medical history of Arthritis; CAD (coronary artery disease); Chronic diastolic CHF (congestive heart failure) ([place]); Chronic kidney disease, stage III (moderate); Chronic  lower back pain; Colon polyps; Diverticulosis; Dyspnea (2009 since July -Sept); Enlarged prostate; Esophageal stricture; GERD (gastroesophageal reflux disease); Heart murmur; Hiatal hernia; History of PFTs; Hyperlipidemia; Hypertension; Interstitial lung disease ([place]); Overweight (BMI 25.0-29.9); RA (rheumatoid arthritis) ([place]); Seropositive rheumatoid arthritis ([place]); and Type II diabetes mellitus ([place]).   reports that he quit smoking about 53 years ago. His smoking use included Cigarettes. He has a 20.00 pack-year smoking history. He has never used smokeless tobacco.  Past Surgical History:  Procedure Laterality Date  . CARDIAC CATHETERIZATION  08/2004   CP- no MI, Cath- small vessell disease   . CARDIAC CATHETERIZATION  12/31/2011   80% distal LM, 100% native LAD, LCx and RCA, 30% prox SVG-OM, SVG-D1 normal, 99% distal, 80% ostial SVG-RCA distal to graft, LIMA-LAD normal; LVEF mildly decreased with posterior basal AK   . CARDIAC CATHETERIZATION  2009   with patent grafts/notes 12/31/2011  . CARDIAC CATHETERIZATION N/A 08/13/2015   Procedure: Left Heart Cath and Cors/Grafts Angiography;  Surgeon: Sherren Mocha, MD;  Location: [facility];  Service: Cardiovascular;  Laterality: N/A;  . CATARACT EXTRACTION W/ INTRAOCULAR LENS  IMPLANT, BILATERAL Bilateral   . CHOLECYSTECTOMY OPEN  11/2003   [name]  . CORONARY ANGIOPLASTY WITH STENT PLACEMENT  01/03/2012   Successful DES to SVG-RCA and cutting balloon angioplasty ostial  PDA   . CORONARY ANGIOPLASTY WITH STENT PLACEMENT  01/14/2014   "1"  . CORONARY ARTERY BYPASS GRAFT  11/1999   CABG X5  . CORONARY STENT PLACEMENT  02/2012   1 stent and balloon  . ESOPHAGOGASTRODUODENOSCOPY (EGD) WITH ESOPHAGEAL DILATION  2010  . HAND SURGERY    . JOINT REPLACEMENT    . KNEE ARTHROSCOPY Right 2008  . LEFT AND RIGHT HEART CATHETERIZATION WITH CORONARY ANGIOGRAM  12/31/2011   Procedure: LEFT AND RIGHT HEART CATHETERIZATION WITH CORONARY ANGIOGRAM;   Surgeon: Burnell Blanks, MD;  Location: [facility] CATH LAB;  Service: Cardiovascular;;  . LEFT AND RIGHT HEART CATHETERIZATION WITH CORONARY ANGIOGRAM N/A 01/14/2014   Procedure: LEFT AND RIGHT HEART CATHETERIZATION WITH CORONARY ANGIOGRAM;  Surgeon: Peter M Martinique, MD;  Location: [facility] CATH LAB;  Service: Cardiovascular;  Laterality: N/A;  . PERCUTANEOUS CORONARY INTERVENTION-BALLOON ONLY  01/03/2012   Procedure: PERCUTANEOUS CORONARY INTERVENTION-BALLOON ONLY;  Surgeon: Peter M Martinique, MD;  Location: [facility] CATH LAB;  Service: Cardiovascular;;  . PERCUTANEOUS CORONARY STENT INTERVENTION (PCI-S)  12/31/2011   Procedure: PERCUTANEOUS CORONARY STENT INTERVENTION (PCI-S);  Surgeon: Burnell Blanks, MD;  Location: [facility] CATH LAB;  Service: Cardiovascular;;  . PERCUTANEOUS CORONARY STENT INTERVENTION (PCI-S) N/A 01/03/2012   Procedure: PERCUTANEOUS CORONARY STENT INTERVENTION (PCI-S);  Surgeon: Peter M Martinique, MD;  Location: [facility] CATH LAB;  Service: Cardiovascular;  Laterality: N/A;  . SHOULDER ARTHROSCOPY WITH OPEN ROTATOR CUFF REPAIR AND DISTAL CLAVICLE ACROMINECTOMY Left 02/27/2013   Procedure: LEFT SHOULDER ARTHROSCOPY WITH MINI OPEN ROTATOR CUFF REPAIR AND SUBACROMIAL DECOMPRESSION AND DISTAL CLAVICLE RESECTION;  Surgeon: [name], MD;  Location: [place];  Service: Orthopedics;  Laterality: Left;  . TOTAL KNEE ARTHROPLASTY Right 03/2010   [name]  . TRIGGER FINGER RELEASE Left 02/27/2013   Procedure: RELEASE TRIGGER FINGER/A-1 PULLEY;  Surgeon: [name], MD;  Location: [place];  Service: Orthopedics;  Laterality: Left;    Allergies  Allergen Reactions  . Doxazosin Mesylate Other (See Comments)    dizziness  . Methocarbamol Rash    Immunization History  Administered Date(s) Administered  . H1N1 03/27/2008  . Influenza Split 12/29/2010, 01/18/2012  . Influenza Whole 02/03/2007, 01/09/2008, 01/01/2009, 12/31/2009  . Influenza,inj,Quad PF,36+ Mos 12/13/2012, 01/15/2014,  01/15/2015, 01/06/2016  . Pneumococcal Conjugate-13 09/10/2013  . Pneumococcal Polysaccharide-23 03/05/1997, 08/17/2011  . Td 03/05/1997, 08/17/2011    Family History  Problem Relation Age of Onset  . COPD Mother   . Heart disease Father   . Heart attack Father   . Diabetes Brother   . Colon cancer Brother 25  . Alcohol abuse Sister   .  Stroke Sister      Current Outpatient Prescriptions:  .  amLODipine (NORVASC) 10 MG tablet, TAKE 1 TABLET BY MOUTH DAILY, Disp: 90 tablet, Rfl: 3 .  aspirin EC 81 MG tablet, Take 81 mg by mouth at bedtime., Disp: , Rfl:  .  atorvastatin (LIPITOR) 80 MG tablet, Take 1 tablet (80 mg total) by mouth daily at 6 PM., Disp: 30 tablet, Rfl: 11 .  azaTHIOprine (IMURAN) 50 MG tablet, Take 1 tablet (50 mg total) by mouth 2 (two) times daily., Disp: 120 tablet, Rfl: 0 .  Blood Glucose Monitoring Suppl (ONE TOUCH ULTRA 2) w/Device KIT, Use to obtain blood sugar daily. Dx Code E11.40, Disp: 1 each, Rfl: 0 .  carvedilol (COREG) 12.5 MG tablet, TAKE 1 TABLET BY MOUTH TWICE A DAY WITH MEALS, Disp: 60 tablet, Rfl: 11 .  clopidogrel (PLAVIX) 75 MG tablet, Take 1 tablet (75 mg total) by mouth daily with breakfast., Disp: 30 tablet, Rfl: 11 .  DHA-Vitamin C-Lutein (EYE HEALTH FORMULA PO), Take 1 tablet by mouth daily. , Disp: , Rfl:  .  Fluticasone-Umeclidin-Vilant (TRELEGY ELLIPTA) 100-62.5-25 MCG/INH AEPB, Inhale 1 puff into the lungs daily., Disp: 28 each, Rfl: 0 .  furosemide (LASIX) 40 MG tablet, TAKE 1 OR 2 TABLETS BY MOUTH DAILY, Disp: 60 tablet, Rfl: 10 .  glucose blood (ONE TOUCH ULTRA TEST) test strip, USE TO CHECK BLOOD SUGAR ONCE A DAY Dx Code E11.40, Disp: 100 each, Rfl: 3 .  isosorbide mononitrate (IMDUR) 30 MG 24 hr tablet, Take 1 tablet (30 mg total) by mouth daily., Disp: 90 tablet, Rfl: 3 .  losartan (COZAAR) 100 MG tablet, TAKE 1 TABLET BY MOUTH DAILY, Disp: 90 tablet, Rfl: 3 .  metFORMIN (GLUCOPHAGE) 1000 MG tablet, TAKE 1 TABLET BY MOUTH TWICE A DAY,  Disp: 60 tablet, Rfl: 10 .  Multiple Vitamin (MULTIVITAMIN WITH MINERALS) TABS tablet, Take 1 tablet by mouth daily., Disp: , Rfl:  .  nitroGLYCERIN (NITROSTAT) 0.4 MG SL tablet, DISSOLVE 1 TABLET UNDER THE TONGUE FOR CHEST PAIN. MAY REPEAT EVERY 5MINUTES UP TO 3 DOSES. IF NO RELIEF, CALL 911**, Disp: 25 tablet, Rfl: 5 .  ONETOUCH DELICA LANCETS 63O MISC, , Disp: , Rfl:  .  pregabalin (LYRICA) 100 MG capsule, Take 1 capsule (100 mg total) by mouth 2 (two) times daily., Disp: 60 capsule, Rfl: 5    Review of Systems     Objective:   Physical Exam  Constitutional: He is oriented to person, place, and time. He appears well-developed and well-nourished. No distress.  HENT:  Head: Normocephalic and atraumatic.  Right Ear: External ear normal.  Left Ear: External ear normal.  Mouth/Throat: Oropharynx is clear and moist. No oropharyngeal exudate.  Eyes: Conjunctivae and EOM are normal. Pupils are equal, round, and reactive to light. Right eye exhibits no discharge. Left eye exhibits no discharge. No scleral icterus.  Neck: Normal range of motion. Neck supple. No JVD present. No tracheal deviation present. No thyromegaly present.  Cardiovascular: Normal rate, regular rhythm and intact distal pulses.  Exam reveals no gallop and no friction rub.   No murmur heard. Pulmonary/Chest: Effort normal. No respiratory distress. He has no wheezes. He has rales. He exhibits no tenderness.  Very faint crackles at lung base  Abdominal: Soft. Bowel sounds are normal. He exhibits no distension and no mass. There is no tenderness. There is no rebound and no guarding.  Visceral obesity +  Musculoskeletal: Normal range of motion. He exhibits no edema or tenderness.  Lymphadenopathy:    He has no cervical adenopathy.  Neurological: He is alert and oriented to person, place, and time. He has normal reflexes. No cranial nerve deficit. Coordination normal.  Skin: Skin is warm and dry. No rash noted. He is not  diaphoretic. No erythema. No pallor.  Psychiatric: He has a normal mood and affect. His behavior is normal. Judgment and thought content normal.  Flat affect  Nursing note and vitals reviewed.  Vitals:   08/03/16 1208  BP: 122/60  Pulse: (!) 56  SpO2: 96%  Weight: 222 lb 12.8 oz (101.1 kg)  Height: '5\' 11"'$  (1.803 m)    Estimated body mass index is 31.07 kg/m as calculated from the following:   Height as of this encounter: '5\' 11"'$  (1.803 m).   Weight as of this encounter: 222 lb 12.8 oz (101.1 kg).     Assessment:       ICD-9-CM ICD-10-CM   1. ILD (interstitial lung disease) ([place]) 515 J84.9        Plan:     ILD (interstitial lung disease) Overall CT and PFT stable since 2015 Too bad trelegy inhaler did not sustain effect in helping your shortness of breath Frustrating you have disabling shortness of breath Options are   - Consider right heart cath - please d/w Dr Johnsie Cancel when you see him  - I have written to Dr Estanislado Pandy to see if there is any utility in cellcept which was recommended at [facility] in 2016  Meanwhile will give you sample bevespi to see if this can help airtrapping  Followup 6 months or sooner if needed   Dr. Brand Males, M.D., [name].C.P Pulmonary and Critical Care Medicine Staff Physician [place] [facility] Pager: [phone], If no answer or between  15:00h - 7:00h: call [phone]  08/03/2016 12:32 PM

## 2016-08-03 NOTE — Telephone Encounter (Signed)
Patient called and left message wanting to schedule an appointment with Dr. Ernestina Patches. I called him back and left a message for him to call back to discuss/ schedule.

## 2016-08-03 NOTE — Telephone Encounter (Signed)
I do not see any problem switching him to CellCept. Please let me know if you want to start him on CellCept or you want me to prescribe.Thank you

## 2016-08-03 NOTE — Progress Notes (Signed)
Office Visit Note  Patient: [name]             Date of Birth: [birth year]           MRN: [id number]             PCP: Viviana Simpler, MD Referring: [name], MD Visit Date: 08/03/2016 Occupation: '@GUAROCC'$ @    Subjective:  No chief complaint on file.   History of Present Illness: [name] is a 83 y.o. male  Follow-up on rheumatoid arthritis.  He has positive rheumatoid factor and CCP.  He also has interstitial lung disease which is being addressed by Dr. Chase Caller.  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  He states that his rheumatoid arthritis is doing well. He is taking azathioprine 50 mg twice a day.  He has a history of chronic kidney disease stage III. His kidney function is stable. Creatinine is elevated and GFR slightly low.  There is no change in his symptoms since the last visit.  Activities of Daily Living:  Patient reports morning stiffness for 10 minutes.   Patient Denies nocturnal pain.  Difficulty dressing/grooming: Denies Difficulty climbing stairs: Reports Difficulty getting out of chair: Reports Difficulty using hands for taps, buttons, cutlery, and/or writing: Denies   Review of Systems  Constitutional: Negative for fatigue.  HENT: Negative for mouth sores and mouth dryness.   Eyes: Negative for dryness.  Respiratory: Negative for shortness of breath.   Gastrointestinal: Negative for constipation and diarrhea.  Musculoskeletal: Negative for myalgias and myalgias.  Skin: Negative for sensitivity to sunlight.  Neurological: Negative for memory loss.  Psychiatric/Behavioral: Negative for sleep disturbance.    PMFS History:  Patient Active Problem List   Diagnosis Date Noted  . Tegretol-induced dizziness 05/14/2016  . Spinal stenosis of lumbar region without neurogenic claudication 03/23/2016  . Spondylosis without myelopathy or radiculopathy, lumbar region 03/23/2016  .  Respiratory failure, chronic ([place]) 11/21/2014  . DM (diabetes mellitus) type II controlled, neurological manifestation ([place])   . Atherosclerotic heart disease of native coronary artery with angina pectoris ([place])   . Ventral hernia 12/17/2013  . Seropositive rheumatoid arthritis ([place])   . Routine general medical examination at a health care facility 08/29/2012  . CKD stage 3 due to type 2 diabetes mellitus ([place])   . ILD (interstitial lung disease) ([place]) 11/28/2011  . Chronic diastolic heart failure ([place]) 09/14/2011  . Obstructive sleep apnea 12/17/2008  . ESOPHAGEAL STRICTURE 10/09/2008  . Reflux esophagitis 09/10/2008  . DIVERTICULOSIS-COLON 09/10/2008  . Aortic sclerosis ([place]) 03/19/2008  . ACTINIC KERATOSIS 10/23/2007  . SLEEP DISORDER, CHRONIC 10/17/2006  . HLD (hyperlipidemia) 09/23/2006  . Essential hypertension 09/23/2006  . GERD 09/23/2006  . BENIGN PROSTATIC HYPERTROPHY 09/23/2006    Past Medical History:  Diagnosis Date  . Arthritis    osteoarthritis, s/p R TKR, and digits  . CAD (coronary artery disease)    a. s/p CABG (2001)  b. s/p DES to RCA and cutting POBA to ostial PDA (2013)   c. s/p DES to SVG to OM2 (01/14/14) d. cath: 08/2015 NSTEMI w/ patent LIMA-LAD and 99% stenosis of SVG-OM w/ DES placed. CTO of SVG-RCA and SVG-D1.   [name] Chronic diastolic CHF (congestive heart failure) ([place])    a) 09/13 ECHO- LVEF 82-50%, grade 1 diastolic dysfunction, mild LA dilatation, atrial septal aneurysm, AV mobility restricted, but no sig AS by doppler; b) 09/04/08 ECHO- LVH, ef  60%, mild AS, c. echo 08/2015: EF perserved of 55-60% with inferolateral HK. Mild AS noted.  . Chronic kidney disease, stage III (moderate)   . Chronic lower back pain   . Colon polyps   . Diverticulosis   . Dyspnea 2009 since July -Sept   05/06/08-CPST-  normal effort, reduced VO2 max 20.5 /65%, reduced at 8.2/ 40%, normal breathing resetvca of 55%, submaximal heart rate response 112/77%, flattened o2 pluse  response at peak exercise-12 ml/beat @ 85%, No VQ mismatch abnormalities, All c/w CIRC Limitation  . Enlarged prostate   . Esophageal stricture    a. s/p dilation spring 2010  . GERD (gastroesophageal reflux disease)   . Heart murmur   . Hiatal hernia   . History of PFTs    mixed pattern on spiro. mild restn on lung volumes with near normal DLCO. Pattern can be explained by CABG scar. Fev1 2.2L/73%, ratio 68 (67), TLC 4.7/68%,RV 1.5L/55%,DLCO 79%  . Hyperlipidemia   . Hypertension   . Interstitial lung disease ([name])    NOS  . Overweight (BMI 25.0-29.9)    BMI 29  . RA (rheumatoid arthritis) ([name])    [name]  . Seropositive rheumatoid arthritis ([place])   . Type II diabetes mellitus ([name])     Family History  Problem Relation Age of Onset  . COPD Mother   . Heart disease Father   . Heart attack Father   . Diabetes Brother   . Colon cancer Brother 25  . Alcohol abuse Sister   . Stroke Sister    Past Surgical History:  Procedure Laterality Date  . CARDIAC CATHETERIZATION  08/2004   CP- no MI, Cath- small vessell disease   . CARDIAC CATHETERIZATION  12/31/2011   80% distal LM, 100% native LAD, LCx and RCA, 30% prox SVG-OM, SVG-D1 normal, 99% distal, 80% ostial SVG-RCA distal to graft, LIMA-LAD normal; LVEF mildly decreased with posterior basal AK   . CARDIAC CATHETERIZATION  2009   with patent grafts/notes 12/31/2011  . CARDIAC CATHETERIZATION N/A 08/13/2015   Procedure: Left Heart Cath and Cors/Grafts Angiography;  Surgeon: Sherren Mocha, MD;  Location: [facility];  Service: Cardiovascular;  Laterality: N/A;  . CATARACT EXTRACTION W/ INTRAOCULAR LENS  IMPLANT, BILATERAL Bilateral   . CHOLECYSTECTOMY OPEN  11/2003   [name]  . CORONARY ANGIOPLASTY WITH STENT PLACEMENT  01/03/2012   Successful DES to SVG-RCA and cutting balloon angioplasty ostial  PDA   . CORONARY ANGIOPLASTY WITH STENT PLACEMENT  01/14/2014   "1"  . CORONARY ARTERY BYPASS GRAFT  11/1999   CABG X5  .  CORONARY STENT PLACEMENT  02/2012   1 stent and balloon  . ESOPHAGOGASTRODUODENOSCOPY (EGD) WITH ESOPHAGEAL DILATION  2010  . HAND SURGERY    . JOINT REPLACEMENT    . KNEE ARTHROSCOPY Right 2008  . LEFT AND RIGHT HEART CATHETERIZATION WITH CORONARY ANGIOGRAM  12/31/2011   Procedure: LEFT AND RIGHT HEART CATHETERIZATION WITH CORONARY ANGIOGRAM;  Surgeon: Burnell Blanks, MD;  Location: [facility] CATH LAB;  Service: Cardiovascular;;  . LEFT AND RIGHT HEART CATHETERIZATION WITH CORONARY ANGIOGRAM N/A 01/14/2014   Procedure: LEFT AND RIGHT HEART CATHETERIZATION WITH CORONARY ANGIOGRAM;  Surgeon: Peter M Martinique, MD;  Location: [facility] CATH LAB;  Service: Cardiovascular;  Laterality: N/A;  . PERCUTANEOUS CORONARY INTERVENTION-BALLOON ONLY  01/03/2012   Procedure: PERCUTANEOUS CORONARY INTERVENTION-BALLOON ONLY;  Surgeon: Peter M Martinique, MD;  Location: [facility] CATH LAB;  Service: Cardiovascular;;  . PERCUTANEOUS CORONARY STENT INTERVENTION (PCI-S)  12/31/2011  Procedure: PERCUTANEOUS CORONARY STENT INTERVENTION (PCI-S);  Surgeon: Burnell Blanks, MD;  Location: [facility] CATH LAB;  Service: Cardiovascular;;  . PERCUTANEOUS CORONARY STENT INTERVENTION (PCI-S) N/A 01/03/2012   Procedure: PERCUTANEOUS CORONARY STENT INTERVENTION (PCI-S);  Surgeon: Peter M Martinique, MD;  Location: [facility] CATH LAB;  Service: Cardiovascular;  Laterality: N/A;  . SHOULDER ARTHROSCOPY WITH OPEN ROTATOR CUFF REPAIR AND DISTAL CLAVICLE ACROMINECTOMY Left 02/27/2013   Procedure: LEFT SHOULDER ARTHROSCOPY WITH MINI OPEN ROTATOR CUFF REPAIR AND SUBACROMIAL DECOMPRESSION AND DISTAL CLAVICLE RESECTION;  Surgeon: [name], MD;  Location: [facility];  Service: Orthopedics;  Laterality: Left;  . TOTAL KNEE ARTHROPLASTY Right 03/2010   [name]  . TRIGGER FINGER RELEASE Left 02/27/2013   Procedure: RELEASE TRIGGER FINGER/A-1 PULLEY;  Surgeon: [name], MD;  Location: [facility];  Service: Orthopedics;  Laterality: Left;   Social History    Social History Narrative   No living will   Requests wife as health care POA   Discussed DNR --he requests this (done 08/29/12)   Not sure about feeding tube---but might accept for some time   Patient lives with wife and daughter in a one story home.  Has 3 children.  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     Objective: Vital Signs: BP (!) 92/45   Pulse 64   Resp 14   Ht '5\' 11"'$  (1.803 m)   Wt 225 lb (102.1 kg)   BMI 31.38 kg/m    Physical Exam  Constitutional: He is oriented to person, place, and time. He appears well-developed and well-nourished.  HENT:  Head: Normocephalic and atraumatic.  Eyes: Conjunctivae and EOM are normal. Pupils are equal, round, and reactive to light.  Neck: Normal range of motion. Neck supple.  Cardiovascular: Normal rate, regular rhythm and normal heart sounds.  Exam reveals no gallop and no friction rub.   No murmur heard. Pulmonary/Chest: Effort normal and breath sounds normal. No respiratory distress. He has no wheezes. He has no rales. He exhibits no tenderness.  Abdominal: Soft. He exhibits no distension and no mass. There is no tenderness. There is no guarding.  Musculoskeletal: Normal range of motion.  Lymphadenopathy:    He has no cervical adenopathy.  Neurological: He is alert and oriented to person, place, and time. He exhibits normal muscle tone. Coordination normal.  Skin: Skin is warm and dry. Capillary refill takes less than 2 seconds. No rash noted.  Psychiatric: He has a normal mood and affect. His behavior is normal. Judgment and thought content normal.  Vitals reviewed.    Musculoskeletal Exam:    CDAI Exam: CDAI Homunculus Exam:   Joint Counts:  CDAI Tender Joint count: 0 CDAI Swollen Joint count: 0  Global Assessments:  Patient Global Assessment: 5 Provider Global Assessment: 5  CDAI Calculated Score: 10 He has no synovitis on examination. His ongoing joint pain is to his right knee (history of  total replacement but continues to cause him pain and discomfort.) He has seen Dr. Durward Fortes since his original surgeon has retired. Dr. Durward Fortes has done x-rays for the patient's right knee and it is well positioned and no further treatment can be done. He is also given him exercises to do further knee pain. This has given him partial relief.   Investigation: No additional findings.  Orders Only on 05/11/2016  Component Date Value Ref Range Status  . WBC 05/11/2016 9.0  3.4 - 10.8 x10E3/uL Final  . RBC 05/11/2016 3.64* 4.14 - 5.80  x10E6/uL Final  . Hemoglobin 05/11/2016 10.0* 13.0 - 17.7 g/dL Final  . Hematocrit 05/11/2016 29.4* 37.5 - 51.0 % Final  . MCV 05/11/2016 81  79 - 97 fL Final  . MCH 05/11/2016 27.5  26.6 - 33.0 pg Final  . MCHC 05/11/2016 34.0  31.5 - 35.7 g/dL Final  . RDW 05/11/2016 16.3* 12.3 - 15.4 % Final  . Platelets 05/11/2016 249  150 - 379 x10E3/uL Final  . Neutrophils 05/11/2016 66  Not Estab. % Final  . Lymphs 05/11/2016 19  Not Estab. % Final  . Monocytes 05/11/2016 9  Not Estab. % Final  . Eos 05/11/2016 5  Not Estab. % Final  . Basos 05/11/2016 1  Not Estab. % Final  . Neutrophils Absolute 05/11/2016 6.0  1.4 - 7.0 x10E3/uL Final  . Lymphocytes Absolute 05/11/2016 1.7  0.7 - 3.1 x10E3/uL Final  . Monocytes Absolute 05/11/2016 0.8  0.1 - 0.9 x10E3/uL Final  . EOS (ABSOLUTE) 05/11/2016 0.4  0.0 - 0.4 x10E3/uL Final  . Basophils Absolute 05/11/2016 0.1  0.0 - 0.2 x10E3/uL Final  . Glucose 05/11/2016 127* 65 - 99 mg/dL Final  . BUN 05/11/2016 36* 8 - 27 mg/dL Final  . Creatinine, Ser 05/11/2016 1.97* 0.76 - 1.27 mg/dL Final  . GFR calc non Af Amer 05/11/2016 31* >59 mL/min/1.73 Final  . GFR calc Af Amer 05/11/2016 36* >59 mL/min/1.73 Final  . BUN/Creatinine Ratio 05/11/2016 18  10 - 24 Final  . Sodium 05/11/2016 140  134 - 144 mmol/L Final  . Potassium 05/11/2016 4.6  3.5 - 5.2 mmol/L Final  . Chloride 05/11/2016 100  96 - 106 mmol/L Final  . CO2  05/11/2016 30* 18 - 29 mmol/L Final  . Calcium 05/11/2016 10.0  8.6 - 10.2 mg/dL Final  . Total Protein 05/11/2016 7.4  6.0 - 8.5 g/dL Final  . Albumin 05/11/2016 3.9  3.5 - 4.7 g/dL Final  . Globulin, Total 05/11/2016 3.5  1.5 - 4.5 g/dL Final  . Albumin/Globulin Ratio 05/11/2016 1.1* 1.2 - 2.2 Final  . Bilirubin Total 05/11/2016 0.3  0.0 - 1.2 mg/dL Final  . Alkaline Phosphatase 05/11/2016 74  39 - 117 IU/L Final  . AST 05/11/2016 17  0 - 40 IU/L Final  . ALT 05/11/2016 12  0 - 44 IU/L Final  Office Visit on 04/08/2016  Component Date Value Ref Range Status  . Sodium 04/08/2016 141  135 - 145 mEq/L Final  . Potassium 04/08/2016 4.7  3.5 - 5.1 mEq/L Final  . Chloride 04/08/2016 102  96 - 112 mEq/L Final  . CO2 04/08/2016 34* 19 - 32 mEq/L Final  . Calcium 04/08/2016 10.2  8.4 - 10.5 mg/dL Final  . Albumin 04/08/2016 4.2  3.5 - 5.2 g/dL Final  . BUN 04/08/2016 32* 6 - 23 mg/dL Final  . Creatinine, Ser 04/08/2016 2.03* 0.40 - 1.50 mg/dL Final  . Glucose, Bld 04/08/2016 135* 70 - 99 mg/dL Final  . Phosphorus 04/08/2016 3.6  2.3 - 4.6 mg/dL Final  . GFR 04/08/2016 33.53* >60.00 mL/min Final  . Hgb A1c MFr Bld 04/08/2016 7.0* 4.6 - 6.5 % Final  Orders Only on 02/18/2016  Component Date Value Ref Range Status  . Sodium 02/18/2016 143  135 - 146 mmol/L Final  . Potassium 02/18/2016 4.3  3.5 - 5.3 mmol/L Final  . Chloride 02/18/2016 102  98 - 110 mmol/L Final  . CO2 02/18/2016 31  20 - 31 mmol/L Final  . Glucose, Bld 02/18/2016 107*  65 - 99 mg/dL Final  . BUN 02/18/2016 27* 7 - 25 mg/dL Final  . Creat 02/18/2016 1.84* 0.70 - 1.11 mg/dL Final   Comment:   For patients > or = 81 years of age: The upper reference limit for Creatinine is approximately 13% higher for people identified as African-American.     . Total Bilirubin 02/18/2016 0.5  0.2 - 1.2 mg/dL Final  . Alkaline Phosphatase 02/18/2016 60  40 - 115 U/L Final  . AST 02/18/2016 16  10 - 35 U/L Final  . ALT 02/18/2016 8* 9 -  46 U/L Final  . Total Protein 02/18/2016 7.4  6.1 - 8.1 g/dL Final  . Albumin 02/18/2016 4.0  3.6 - 5.1 g/dL Final  . Calcium 02/18/2016 9.5  8.6 - 10.3 mg/dL Final  . GFR, Est African American 02/18/2016 39* >=60 mL/min Final  . GFR, Est Non African American 02/18/2016 33* >=60 mL/min Final  . WBC 02/18/2016 8.2  3.8 - 10.8 K/uL Final  . RBC 02/18/2016 3.87* 4.20 - 5.80 MIL/uL Final  . Hemoglobin 02/18/2016 10.9* 13.2 - 17.1 g/dL Final  . HCT 02/18/2016 32.8* 38.5 - 50.0 % Final  . MCV 02/18/2016 84.8  80.0 - 100.0 fL Final  . MCH 02/18/2016 28.2  27.0 - 33.0 pg Final  . MCHC 02/18/2016 33.2  32.0 - 36.0 g/dL Final  . RDW 02/18/2016 16.1* 11.0 - 15.0 % Final  . Platelets 02/18/2016 235  140 - 400 K/uL Final  . MPV 02/18/2016 10.3  7.5 - 12.5 fL Final  . Neutro Abs 02/18/2016 5330  1,500 - 7,800 cells/uL Final  . Lymphs Abs 02/18/2016 1804  850 - 3,900 cells/uL Final  . Monocytes Absolute 02/18/2016 656  200 - 950 cells/uL Final  . Eosinophils Absolute 02/18/2016 328  15 - 500 cells/uL Final  . Basophils Absolute 02/18/2016 82  0 - 200 cells/uL Final  . Neutrophils Relative % 02/18/2016 65  % Final  . Lymphocytes Relative 02/18/2016 22  % Final  . Monocytes Relative 02/18/2016 8  % Final  . Eosinophils Relative 02/18/2016 4  % Final  . Basophils Relative 02/18/2016 1  % Final  . Smear Review 02/18/2016 Criteria for review not met   Final     Imaging: Ct Chest High Resolution  Result Date: 07/08/2016 CLINICAL DATA:  Chronic dyspnea and cough. Evaluate for interstitial lung disease. EXAM: CT CHEST WITHOUT CONTRAST TECHNIQUE: Multidetector CT imaging of the chest was performed following the standard protocol without intravenous contrast. High resolution imaging of the lungs, as well as inspiratory and expiratory imaging, was performed. COMPARISON:  07/08/2015 high-resolution chest CT. 08/13/2015 chest radiograph. FINDINGS: Cardiovascular: Stable top-normal heart size. No significant  pericardial fluid/thickening. Left main, left anterior descending, left circumflex and right coronary atherosclerosis status post CABG. Atherosclerotic nonaneurysmal thoracic aorta. Normal caliber pulmonary arteries. Mediastinum/Nodes: No discrete thyroid nodules. Unremarkable esophagus. No pathologically enlarged axillary, mediastinal or gross hilar lymph nodes, noting limited sensitivity for the detection of hilar adenopathy on this noncontrast study. Lungs/Pleura: No pneumothorax. Stable smooth pleural thickening with increased scattered pleural calcifications in the dependent basilar right pleural space. No left pleural thickening or left pleural calcification. No pleural effusions. No acute consolidative airspace disease, lung masses or significant pulmonary nodules. There is stable mild-to-moderate cylindrical and varicoid bronchiectasis in the dependent bilateral lower lobes. There are parenchymal bands with associated patchy subpleural reticulation and ground-glass attenuation in the dependent and basilar lower lobes bilaterally, not appreciably changed back to 09/19/2013. No  frank honeycombing. No evidence of progressive disease. Stable mild patchy air trapping in both lungs on the expiration sequence. Upper abdomen: Unremarkable. Musculoskeletal: No aggressive appearing focal osseous lesions. Sternotomy wires appear intact. Moderate thoracic spondylosis . IMPRESSION: 1. Stable mild-to-moderate cylindrical and varicoid bronchiectasis in the bilateral dependent lower lobes. 2. Continued stability of parenchymal banding with associated subpleural reticulation and ground-glass attenuation at the areas of bronchiectasis in the dependent and basilar lower lobes, most consistent with postinfectious/postinflammatory scarring. No findings to suggest interstitial lung disease. 3. Stable smooth pleural thickening and calcification in the dependent basilar right pleural space, most compatible with chronic  postinflammatory change. No pleural effusions. 4. Stable mild patchy air trapping in both lungs, suggesting small airways disease. 5. Aortic atherosclerosis. Left main and 3 vessel coronary atherosclerosis status post CABG. Electronically Signed   By: Ilona Sorrel M.D.   On: 07/08/2016 14:11    Speciality Comments: No specialty comments available.    Procedures:  No procedures performed Allergies: Doxazosin mesylate and Methocarbamol   Assessment / Plan:     Visit Diagnoses: High risk medications (not anticoagulants) long-term use - Plan: CBC with Differential/Platelet, COMPLETE METABOLIC PANEL WITH GFR   Plan: #1: Rheumatoid arthritis, positive RF, positive CCP, Stable. No pain to his hands or wrists. No synovitis on examination  #2: High-risk prescription. On Imuran 50 mg; one pill twice a day; [facility]  #3: CBC with differential and CMP with GFR were last done February 2018 and June L. We have placed an order for today's  #4: Ongoing right knee pain after rightfully replacement years ago. His original surgeon who did the surgery for the right totally replacement has retired. Patient has followed up with Dr. Durward Fortes who has offered him exercises to minimize his pain and discomfort since x-rays prove that the hardware is intact and well placed.  #5: CBC with differential and CMP with GFR to every 2 months.  #6: Chronic kidney disease, stage III. Probably due to diabetes mellitus. Stable.  Orders: Orders Placed This Encounter  Procedures  . CBC with Differential/Platelet  . COMPLETE METABOLIC PANEL WITH GFR   No orders of the defined types were placed in this encounter.   Face-to-face time spent with patient was 30 minutes. 50% of time was spent in counseling and coordination of care.  Follow-Up Instructions: Return in about 5 months (around 01/03/2017).   Eliezer Lofts, PA-C  Note - This record has been created using [employer].  Chart creation  errors have been sought, but may not always  have been located. Such creation errors do not reflect on  the standard of medical care.

## 2016-08-04 NOTE — Telephone Encounter (Signed)
Please sch appt to start on Cellcept

## 2016-08-04 NOTE — Telephone Encounter (Signed)
Patient advised he would need an appointment to discuss starting a new medication. Patient transferred to the front to schedule appointment.

## 2016-08-04 NOTE — Telephone Encounter (Signed)
[name]  He is so frustrated by his dyspnea, yes pleaes go ahead and change immuran to cellcept and we can see if this helps improve his lung inflammation and thereby dyspnea  Dr. Brand Males, M.D., [facility].C.P Pulmonary and Critical Care Medicine Staff Physician [place] [facility] Pager: [phone], If no answer or between  15:00h - 7:00h: call [phone]  08/04/2016 8:49 AM

## 2016-08-05 NOTE — Progress Notes (Signed)
Office Visit Note  Patient: [name]             Date of Birth: 1931-05-18           MRN: [id number]             PCP: [name], MD Referring: [name], MD Visit Date: 08/10/2016 Occupation: @GUAROCC @    Subjective:  SOB  History of Present Illness: [name] is a 83 y.o. male with history of interstitial lung disease and rheumatoid arthritis. He states his joints are doing fairly well without any joint swelling but he does have some stiffness in his hands. He's been doing quite well as regards to his arthritis on Imuran. He was recently evaluated by Dr. Chase Caller who recommended CellCept to control his pulmonary symptoms. The plan was to bring him in today to start on CellCept. His most recent labs showed microcytic anemia with hemoglobin of 8.7. He is also on Plavix. I was concerned about starting him on CellCept prior to evaluation of his anemia. I contacted Dr. Silvio Pate. His cardiologist wants him to stay on Plavix. We're uncertain about the cause of anemia at this point. His PCP once and to have following labs which will be done today. He'll be also checking Hemoccult. We will have detailed discussion with patient regarding CellCept and use of prophylactic Bactrim which will be started after we sort out the cause of anemia.  Activities of Daily Living:  Patient reports morning stiffness for 20 minutes.   Patient Denies nocturnal pain.  Difficulty dressing/grooming: Denies Difficulty climbing stairs: Reports Difficulty getting out of chair: Reports Difficulty using hands for taps, buttons, cutlery, and/or writing: Denies   Review of Systems  Constitutional: Positive for fatigue and weakness. Negative for weight gain and weight loss.  HENT: Positive for mouth dryness. Negative for mouth sores and nose dryness.   Eyes: Positive for itching and dryness. Negative for pain and redness.  Respiratory: Positive for cough and shortness of breath. Negative for  difficulty breathing.   Cardiovascular: Positive for swelling in legs/feet. Negative for chest pain, palpitations, hypertension and irregular heartbeat.  Gastrointestinal: Positive for constipation. Negative for blood in stool and diarrhea.  Genitourinary: Negative for painful urination.  Musculoskeletal: Positive for arthralgias and joint pain. Negative for joint swelling, myalgias, morning stiffness and myalgias.  Skin: Negative for color change, rash, nodules/bumps, skin tightness and ulcers.  Allergic/Immunologic: Negative for susceptible to infections.  Neurological: Positive for dizziness.  Hematological: Negative for swollen glands.  Psychiatric/Behavioral: Negative for depressed mood and sleep disturbance. The patient is not nervous/anxious.     PMFS History:  Patient Active Problem List   Diagnosis Date Noted  . Tegretol-induced dizziness 05/14/2016  . Spinal stenosis of lumbar region without neurogenic claudication 03/23/2016  . Spondylosis without myelopathy or radiculopathy, lumbar region 03/23/2016  . Respiratory failure, chronic ([place]) 11/21/2014  . DM (diabetes mellitus) type II controlled, neurological manifestation ([place])   . Atherosclerotic heart disease of native coronary artery with angina pectoris ([place])   . Ventral hernia 12/17/2013  . Seropositive rheumatoid arthritis ([place])   . Routine general medical examination at a health care facility 08/29/2012  . CKD stage 3 due to type 2 diabetes mellitus ([place])   . ILD (interstitial lung disease) ([place]) 11/28/2011  . Chronic diastolic heart failure ([place]) 09/14/2011  . Obstructive sleep apnea 12/17/2008  . ESOPHAGEAL STRICTURE 10/09/2008  . Reflux esophagitis 09/10/2008  . DIVERTICULOSIS-COLON 09/10/2008  . Aortic sclerosis ([place]) 03/19/2008  .  ACTINIC KERATOSIS 10/23/2007  . SLEEP DISORDER, CHRONIC 10/17/2006  . HLD (hyperlipidemia) 09/23/2006  . Essential hypertension 09/23/2006  . GERD 09/23/2006  . BENIGN PROSTATIC  HYPERTROPHY 09/23/2006    Past Medical History:  Diagnosis Date  . Arthritis    osteoarthritis, s/p R TKR, and digits  . CAD (coronary artery disease)    a. s/p CABG (2001)  b. s/p DES to RCA and cutting POBA to ostial PDA (2013)   c. s/p DES to SVG to OM2 (01/14/14) d. cath: 08/2015 NSTEMI w/ patent LIMA-LAD and 99% stenosis of SVG-OM w/ DES placed. CTO of SVG-RCA and SVG-D1.   [name] Chronic diastolic CHF (congestive heart failure) ([place])    a) 09/13 ECHO- LVEF 62-95%, grade 1 diastolic dysfunction, mild LA dilatation, atrial septal aneurysm, AV mobility restricted, but no sig AS by doppler; b) 09/04/08 ECHO- LVH, ef 60%, mild AS, c. echo 08/2015: EF perserved of 55-60% with inferolateral HK. Mild AS noted.  . Chronic kidney disease, stage III (moderate)   . Chronic lower back pain   . Colon polyps   . Diverticulosis   . Dyspnea 2009 since July -Sept   05/06/08-CPST-  normal effort, reduced VO2 max 20.5 /65%, reduced at 8.2/ 40%, normal breathing resetvca of 55%, submaximal heart rate response 112/77%, flattened o2 pluse response at peak exercise-12 ml/beat @ 85%, No VQ mismatch abnormalities, All c/w CIRC Limitation  . Enlarged prostate   . Esophageal stricture    a. s/p dilation spring 2010  . GERD (gastroesophageal reflux disease)   . Heart murmur   . Hiatal hernia   . History of PFTs    mixed pattern on spiro. mild restn on lung volumes with near normal DLCO. Pattern can be explained by CABG scar. Fev1 2.2L/73%, ratio 68 (67), TLC 4.7/68%,RV 1.5L/55%,DLCO 79%  . Hyperlipidemia   . Hypertension   . Interstitial lung disease ([name])    NOS  . Overweight (BMI 25.0-29.9)    BMI 29  . RA (rheumatoid arthritis) ([name])    [name]  . Seropositive rheumatoid arthritis ([place])   . Type II diabetes mellitus ([name])     Family History  Problem Relation Age of Onset  . COPD Mother   . Heart disease Father   . Heart attack Father   . Diabetes Brother   . Colon cancer Brother 25  . Alcohol abuse  Sister   . Stroke Sister    Past Surgical History:  Procedure Laterality Date  . CARDIAC CATHETERIZATION  08/2004   CP- no MI, Cath- small vessell disease   . CARDIAC CATHETERIZATION  12/31/2011   80% distal LM, 100% native LAD, LCx and RCA, 30% prox SVG-OM, SVG-D1 normal, 99% distal, 80% ostial SVG-RCA distal to graft, LIMA-LAD normal; LVEF mildly decreased with posterior basal AK   . CARDIAC CATHETERIZATION  2009   with patent grafts/notes 12/31/2011  . CARDIAC CATHETERIZATION N/A 08/13/2015   Procedure: Left Heart Cath and Cors/Grafts Angiography;  Surgeon: Sherren Mocha, MD;  Location: [facility];  Service: Cardiovascular;  Laterality: N/A;  . CATARACT EXTRACTION W/ INTRAOCULAR LENS  IMPLANT, BILATERAL Bilateral   . CHOLECYSTECTOMY OPEN  11/2003   [name]  . CORONARY ANGIOPLASTY WITH STENT PLACEMENT  01/03/2012   Successful DES to SVG-RCA and cutting balloon angioplasty ostial  PDA   . CORONARY ANGIOPLASTY WITH STENT PLACEMENT  01/14/2014   "1"  . CORONARY ARTERY BYPASS GRAFT  11/1999   CABG X5  . CORONARY STENT PLACEMENT  02/2012  1 stent and balloon  . ESOPHAGOGASTRODUODENOSCOPY (EGD) WITH ESOPHAGEAL DILATION  2010  . HAND SURGERY    . JOINT REPLACEMENT    . KNEE ARTHROSCOPY Right 2008  . LEFT AND RIGHT HEART CATHETERIZATION WITH CORONARY ANGIOGRAM  12/31/2011   Procedure: LEFT AND RIGHT HEART CATHETERIZATION WITH CORONARY ANGIOGRAM;  Surgeon: Burnell Blanks, MD;  Location: [facility] CATH LAB;  Service: Cardiovascular;;  . LEFT AND RIGHT HEART CATHETERIZATION WITH CORONARY ANGIOGRAM N/A 01/14/2014   Procedure: LEFT AND RIGHT HEART CATHETERIZATION WITH CORONARY ANGIOGRAM;  Surgeon: Peter M Martinique, MD;  Location: [facility] CATH LAB;  Service: Cardiovascular;  Laterality: N/A;  . PERCUTANEOUS CORONARY INTERVENTION-BALLOON ONLY  01/03/2012   Procedure: PERCUTANEOUS CORONARY INTERVENTION-BALLOON ONLY;  Surgeon: Peter M Martinique, MD;  Location: [facility] CATH LAB;  Service: Cardiovascular;;  .  PERCUTANEOUS CORONARY STENT INTERVENTION (PCI-S)  12/31/2011   Procedure: PERCUTANEOUS CORONARY STENT INTERVENTION (PCI-S);  Surgeon: Burnell Blanks, MD;  Location: [facility] CATH LAB;  Service: Cardiovascular;;  . PERCUTANEOUS CORONARY STENT INTERVENTION (PCI-S) N/A 01/03/2012   Procedure: PERCUTANEOUS CORONARY STENT INTERVENTION (PCI-S);  Surgeon: Peter M Martinique, MD;  Location: [facility] CATH LAB;  Service: Cardiovascular;  Laterality: N/A;  . SHOULDER ARTHROSCOPY WITH OPEN ROTATOR CUFF REPAIR AND DISTAL CLAVICLE ACROMINECTOMY Left 02/27/2013   Procedure: LEFT SHOULDER ARTHROSCOPY WITH MINI OPEN ROTATOR CUFF REPAIR AND SUBACROMIAL DECOMPRESSION AND DISTAL CLAVICLE RESECTION;  Surgeon: [name], MD;  Location: [facility];  Service: Orthopedics;  Laterality: Left;  . TOTAL KNEE ARTHROPLASTY Right 03/2010   [name]  . TRIGGER FINGER RELEASE Left 02/27/2013   Procedure: RELEASE TRIGGER FINGER/A-1 PULLEY;  Surgeon: [name], MD;  Location: [facility];  Service: Orthopedics;  Laterality: Left;   Social History   Social History Narrative   No living will   Requests wife as health care POA   Discussed DNR --he requests this (done 08/29/12)   Not sure about feeding tube---but might accept for some time   Patient lives with wife and daughter in a one story home.  Has 3 children.  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     Objective: Vital Signs: BP (!) 117/51   Pulse 63   Resp 14   Ht 6\' 1"  (1.854 m)   Wt 220 lb (99.8 kg)   BMI 29.03 kg/m    Physical Exam  Constitutional: He is oriented to person, place, and time. He appears well-developed and well-nourished.  HENT:  Head: Normocephalic and atraumatic.  Eyes: Conjunctivae and EOM are normal. Pupils are equal, round, and reactive to light.  Neck: Normal range of motion. Neck supple.  Cardiovascular: Normal rate, regular rhythm and normal heart sounds.   Pulmonary/Chest: Effort normal. He has rales.  bilateral lung  bases  Abdominal: Soft. Bowel sounds are normal.  Neurological: He is alert and oriented to person, place, and time.  Skin: Skin is warm and dry. Capillary refill takes less than 2 seconds.  Psychiatric: He has a normal mood and affect. His behavior is normal.  Nursing note and vitals reviewed.    Musculoskeletal Exam: C-spine limited range of motion. He has thoracic kyphosis. Shoulder joint abduction is limited to 120 bilaterally. Elbow joints good range of motion. He had no synovitis over MCPs or PIP joints see DIP thickening bilaterally. Hip joints knee joints are good range of motion with no synovitis.  CDAI Exam: CDAI Homunculus Exam:   Joint Counts:  CDAI Tender Joint count: 0 CDAI Swollen Joint count: 0  Global Assessments:  Patient Global Assessment: 2 Provider Global Assessment: 2  CDAI Calculated Score: 4    Investigation: Findings:  Dr Chase Caller has indicated patient could benefit from changing Imuran to Cellcept, patient presents to discuss medication risks/ benefits, and lab monitor.   CBC Latest Ref Rng & Units 08/03/2016 05/11/2016 02/18/2016  WBC 3.8 - 10.8 K/uL 7.2 9.0 8.2  Hemoglobin 13.2 - 17.1 g/dL 8.7(L) - 10.9(L)  Hematocrit 38.5 - 50.0 % 27.5(L) 29.4(L) 32.8(L)  Platelets 140 - 400 K/uL 237 249 235    CMP Latest Ref Rng & Units 08/03/2016 05/11/2016 04/08/2016  Glucose 65 - 99 mg/dL 222(H) 127(H) 135(H)  BUN 7 - 25 mg/dL 27(H) 36(H) 32(H)  Creatinine 0.70 - 1.11 mg/dL 2.29(H) 1.97(H) 2.03(H)  Sodium 135 - 146 mmol/L 140 140 141  Potassium 3.5 - 5.3 mmol/L 4.0 4.6 4.7  Chloride 98 - 110 mmol/L 100 100 102  CO2 20 - 31 mmol/L 27 30(H) 34(H)  Calcium 8.6 - 10.3 mg/dL 8.9 10.0 10.2  Total Protein 6.1 - 8.1 g/dL 6.9 7.4 -  Total Bilirubin 0.2 - 1.2 mg/dL 0.5 0.3 -  Alkaline Phos 40 - 115 U/L 58 74 -  AST 10 - 35 U/L 13 17 -  ALT 9 - 46 U/L 7(L) 12 -   Imaging: No results found.  Speciality Comments: No specialty comments available.    Procedures:    No procedures performed Allergies: Doxazosin mesylate and Methocarbamol   Assessment / Plan:     Visit Diagnoses: ILD (interstitial lung disease) ([place]): He continues to have active lung disease with bibasilar crackles. Dr. Chase Caller wants to switch him to CellCept from Imuran. We had detailed discussion regarding CellCept, indications side effects contraindications. Also discussed prophylactic Bactrim. The plan is to start him on CellCept once we finish the evaluation for anemia.  Seropositive rheumatoid arthritis ([place]): Very well controlled on Imuran. He has no synovitis on examination.  High risk medication use - on Imuran, Hb 8.7, Cr 2.29  DDD Lspine: Chronic discomfort  History of chronic kidney disease: Followed up by Dr. Florene Glen  History of hyperlipidemia  History of diverticulosis  History of CHF (congestive heart failure): Followed up by cardiology. He is on Plavix.  Atherosclerosis of native coronary artery of native heart with angina pectoris ([name]) - on Plavix  Anemia, unspecified type - Plan: CBC with Differential/Platelet, Ferritin, Iron and TIBC, Glucose 6 phosphate dehydrogenase, Vitamin B12, Retic, Folate    Orders: Orders Placed This Encounter  Procedures  . CBC with Differential/Platelet  . Ferritin  . Iron and TIBC  . Glucose 6 phosphate dehydrogenase  . Vitamin B12  . Retic  . Folate  . Serum protein electrophoresis with reflex  . IgG, IgA, IgM   No orders of the defined types were placed in this encounter.   Face-to-face time spent with patient was 30 minutes. 50% of time was spent in counseling and coordination of care.  Follow-Up Instructions: Return in about 1 month (around 09/10/2016) for Rheumatoid arthritis,ILD.   [name], MD  Note - This record has been created using Editor, commissioning.  Chart creation errors have been sought, but may not always  have been located. Such creation errors do not reflect on  the standard of medical  care.

## 2016-08-05 NOTE — Progress Notes (Signed)
Based on this recent abnormal kidney function and moderate anemia, Dr D will see him in office in a few days and wants to switch him from imuran 50mg  bid and cell-cept.  PLEASE SEND LABS TO PCP AND LET THEM KNOW THAT HIS ANEMIA HAS WORSENED, KIDNEY FUNCTION HAS WORSENED.  #1: CMP with GFR is within normal limits except for elevated creatinine at 2.29(more elevated than past labs); with GFR is low at 26 (more decreased than  past labs); nonfasting glucose is at 222.(Please tell patient to see PCP regarding current nonfasting glucose is considerably high compared to the last 3 labs that we've done over the last 5-6 months). #2: CBC with differential is within normal limits except for low RBCs, hemoglobin, hematocrit, MCV, MCH (consistent with past labs but patient should discuss with PCP).

## 2016-08-09 ENCOUNTER — Telehealth: Payer: Self-pay | Admitting: Radiology

## 2016-08-09 ENCOUNTER — Ambulatory Visit: Payer: Medicare Other | Admitting: Neurology

## 2016-08-09 DIAGNOSIS — D649 Anemia, unspecified: Secondary | ICD-10-CM

## 2016-08-09 NOTE — Telephone Encounter (Signed)
Does he need further workup for possible bleeding? In case he starts CellCept we will have to and Bactrim DS 3 times a week. [place] hold off switching to CellCept at this point?

## 2016-08-09 NOTE — Telephone Encounter (Signed)
Okay---thanks for the clarification. We don't have any clear cut plans for procedure yet.

## 2016-08-09 NOTE — Telephone Encounter (Signed)
Can we stop the clopidogrel altogether since he is about a year out from DES? His hemoglobin is dropping--unclear cause at this point and he is being considered for CellCept

## 2016-08-09 NOTE — Telephone Encounter (Signed)
He just had a drug eluting stent 08/13/15---so may be able to come off the plavix now. I will send this note to Dr Johnsie Cancel also for his review. The hemoglobin is very low. We probably should at least check for blood in stools and consider a colonoscopy if positive (he had 3 polyps at his last exam in 2010)  [place], Can you please have the lab send him a FOBT test kit and call him to explain why we are checking it

## 2016-08-09 NOTE — Telephone Encounter (Signed)
Will you please write a note to Dr Silvio Pate concerning the abnormal labs for patient? his anemia and kidney functions have worsened on recent labs

## 2016-08-09 NOTE — Telephone Encounter (Signed)
[name] is a mutual patient of ours. He has known history of pulmonary fibrosis. He's been on Imuran so far. Dr. Chase Caller recommended to switch him to CellCept. My plan is to start him on CellCept soon. I was reviewing his most recent labs and his hemoglobin is down to 8.7 and his creatinine has gone up. I noted that he is on Plavix. I'm concerned about the blood loss and anemia. If you could kindly evaluate the patient before we can start him on CellCept ever truly appreciated. Thank you [employer]

## 2016-08-09 NOTE — Telephone Encounter (Signed)
Would continue plavix after procedure as he has had multiple interventions to SVG OM and has 2/4 grafts with failure and residual disease

## 2016-08-09 NOTE — Telephone Encounter (Signed)
Ok to hold plavix for 5 days before endoscopic procedure if needed

## 2016-08-10 ENCOUNTER — Ambulatory Visit (INDEPENDENT_AMBULATORY_CARE_PROVIDER_SITE_OTHER): Payer: Medicare Other | Admitting: Rheumatology

## 2016-08-10 ENCOUNTER — Encounter: Payer: Self-pay | Admitting: Rheumatology

## 2016-08-10 VITALS — BP 117/51 | HR 63 | Resp 14 | Ht 73.0 in | Wt 220.0 lb

## 2016-08-10 DIAGNOSIS — Z8679 Personal history of other diseases of the circulatory system: Secondary | ICD-10-CM

## 2016-08-10 DIAGNOSIS — J849 Interstitial pulmonary disease, unspecified: Secondary | ICD-10-CM

## 2016-08-10 DIAGNOSIS — Z87448 Personal history of other diseases of urinary system: Secondary | ICD-10-CM

## 2016-08-10 DIAGNOSIS — M059 Rheumatoid arthritis with rheumatoid factor, unspecified: Secondary | ICD-10-CM

## 2016-08-10 DIAGNOSIS — Z8719 Personal history of other diseases of the digestive system: Secondary | ICD-10-CM

## 2016-08-10 DIAGNOSIS — Z8639 Personal history of other endocrine, nutritional and metabolic disease: Secondary | ICD-10-CM

## 2016-08-10 DIAGNOSIS — M47816 Spondylosis without myelopathy or radiculopathy, lumbar region: Secondary | ICD-10-CM

## 2016-08-10 DIAGNOSIS — D649 Anemia, unspecified: Secondary | ICD-10-CM

## 2016-08-10 DIAGNOSIS — Z79899 Other long term (current) drug therapy: Secondary | ICD-10-CM

## 2016-08-10 DIAGNOSIS — I25119 Atherosclerotic heart disease of native coronary artery with unspecified angina pectoris: Secondary | ICD-10-CM | POA: Diagnosis not present

## 2016-08-10 LAB — CBC WITH DIFFERENTIAL/PLATELET
BASOS PCT: 1 %
Basophils Absolute: 78 cells/uL (ref 0–200)
EOS ABS: 390 {cells}/uL (ref 15–500)
Eosinophils Relative: 5 %
HEMATOCRIT: 30.1 % — AB (ref 38.5–50.0)
HEMOGLOBIN: 9.5 g/dL — AB (ref 13.2–17.1)
LYMPHS ABS: 1794 {cells}/uL (ref 850–3900)
LYMPHS PCT: 23 %
MCH: 24.4 pg — ABNORMAL LOW (ref 27.0–33.0)
MCHC: 31.6 g/dL — AB (ref 32.0–36.0)
MCV: 77.2 fL — AB (ref 80.0–100.0)
MONO ABS: 624 {cells}/uL (ref 200–950)
MPV: 10 fL (ref 7.5–12.5)
Monocytes Relative: 8 %
NEUTROS PCT: 63 %
Neutro Abs: 4914 cells/uL (ref 1500–7800)
Platelets: 251 10*3/uL (ref 140–400)
RBC: 3.9 MIL/uL — ABNORMAL LOW (ref 4.20–5.80)
RDW: 17.2 % — AB (ref 11.0–15.0)
WBC: 7.8 10*3/uL (ref 3.8–10.8)

## 2016-08-10 LAB — RETICULOCYTES
ABS RETIC: 62400 {cells}/uL (ref 25000–90000)
RBC.: 3.9 MIL/uL — AB (ref 4.20–5.80)
RETIC CT PCT: 1.6 %

## 2016-08-10 NOTE — Telephone Encounter (Signed)
Terrie, Please get him in for this blood work and he should be getting a FOBT kit as well (I didn't order it in case it was already done). Thanks  I will check some labs--if he is iron deficient, he should have further work up before cellcept

## 2016-08-10 NOTE — Telephone Encounter (Signed)
Okay; thanks.

## 2016-08-10 NOTE — Telephone Encounter (Signed)
I spoke to the [name] about coming in for more blood work. He is going to Dr Estanislado Pandy today and will get it done there. I will send him an ifob kit in the mail

## 2016-08-10 NOTE — Progress Notes (Signed)
Pharmacy Note  Subjective: Patient presents today to the [facility] to see Dr. Estanislado Pandy.  Dr. Chase Caller recommended changing patient from Imuran to CellCept.  Patient seen by the pharmacist for counseling on mycophenolate (CellCept).    Objective: CBC    Component Value Date/Time   WBC 7.2 08/03/2016 0927   RBC 3.52 (L) 08/03/2016 0927   HGB 8.7 (L) 08/03/2016 0927   HCT 27.5 (L) 08/03/2016 0927   HCT 29.4 (L) 05/11/2016 1233   PLT 237 08/03/2016 0927   PLT 249 05/11/2016 1233   MCV 78.1 (L) 08/03/2016 0927   MCV 81 05/11/2016 1233   MCH 24.7 (L) 08/03/2016 0927   MCHC 31.6 (L) 08/03/2016 0927   RDW 17.4 (H) 08/03/2016 0927   RDW 16.3 (H) 05/11/2016 1233   LYMPHSABS 1,584 08/03/2016 0927   LYMPHSABS 1.7 05/11/2016 1233   MONOABS 504 08/03/2016 0927   EOSABS 360 08/03/2016 0927   EOSABS 0.4 05/11/2016 1233   BASOSABS 0 08/03/2016 0927   BASOSABS 0.1 05/11/2016 1233   CMP     Component Value Date/Time   NA 140 08/03/2016 0927   NA 140 05/11/2016 1233   K 4.0 08/03/2016 0927   CL 100 08/03/2016 0927   CO2 27 08/03/2016 0927   GLUCOSE 222 (H) 08/03/2016 0927   BUN 27 (H) 08/03/2016 0927   BUN 36 (H) 05/11/2016 1233   CREATININE 2.29 (H) 08/03/2016 0927   CALCIUM 8.9 08/03/2016 0927   PROT 6.9 08/03/2016 0927   PROT 7.4 05/11/2016 1233   ALBUMIN 3.6 08/03/2016 0927   ALBUMIN 3.9 05/11/2016 1233   AST 13 08/03/2016 0927   ALT 7 (L) 08/03/2016 0927   ALKPHOS 58 08/03/2016 0927   BILITOT 0.5 08/03/2016 0927   BILITOT 0.3 05/11/2016 1233   GFRNONAA 26 (L) 08/03/2016 0927   GFRAA 30 (L) 08/03/2016 0927   TB Gold: negative (12/29/2011) Hepatitis panel: negative (12/29/2011)  Assessment/Plan:  Patient was counseled on the purpose, proper use, and adverse effects of mycophenolate including risk of infection, new or reactivation of viral infections, nausea, and headaches.  Discussed warning of increased risk of development of lymphoma and other  malignancies, particularly of the skin.  Discussed risk of neutropenia and discussed importance of regular labs to monitor blood counts, liver function, and kidney function.  Counseled patient on importance of taking PCP prophylaxis while on mycophenolate.  Counseled patient on purpose, proper use, and adverse effects of sulfamethoxazole/trimethoprim.  Provided patient with educational materials and answered all questions.  Patient consented to mycophenolate.  Will wait until anemia evaluation is completed before initiation of CellCept.    Elisabeth Most, Pharm.D., BCPS, CPP Clinical Pharmacist Pager: [phone] Phone: [phone] 08/10/2016 2:49 PM

## 2016-08-10 NOTE — Patient Instructions (Signed)
Mycophenolate capsules What is this medicine? MYCOPHENOLATE MOFETIL (mye koe FEN oh late MOE fe til) is used to decrease the immune system's response to a transplanted organ. This medicine may be used for other purposes; ask your health care provider or pharmacist if you have questions. COMMON BRAND NAME(S): CellCept What should I tell my health care provider before I take this medicine? They need to know if you have any of these conditions: -anemia or other blood disorder -diarrhea -immune system problems -infection -kidney disease -phenylketonuria -stomach problems -an unusual or allergic reaction to mycophenolate mofetil, other medicines, foods, dyes, or preservatives -pregnant or trying to get pregnant -breast-feeding How should I use this medicine? Take this medicine by mouth with a full glass of water. Follow the directions on the prescription label. Take this medicine on an empty stomach, at least 1 hour before or 2 hours after food. Do not take with food unless your doctor approves. Swallow the medicine whole. Do not cut, crush, or chew the medicine. If the medicine is broken or is not intact, do not get the powder on your skin or eyes. If contact occurs, rinse thoroughly with water. Take your medicine at regular intervals. Do not take your medicine more often than directed. Do not stop taking except on your doctor's advice. A special MedGuide will be given to you by the pharmacist with each prescription and refill. Be sure to read this information carefully each time. Talk to your pediatrician regarding the use of this medicine in children. Special care may be needed. Overdosage: If you think you have taken too much of this medicine contact a poison control center or emergency room at once. NOTE: This medicine is only for you. Do not share this medicine with others. What if I miss a dose? If you miss a dose, take it as soon as you can. If it is almost time for your next dose, take  only that dose. Do not take double or extra doses. What may interact with this medicine? -acyclovir or valacyclovir -antacids -azathioprine -birth control pills -certain antibiotics like ciprofloxacin and amoxicillin; clavulanic acid -ganciclovir or valganciclovir -lanthanum carbonate -medicines for cholesterol like cholestyramine and colestipol -metronidazole -norfloxacin -other mycophenolate medicines -probenecid -rifampin -sevelamer -vaccines This list may not describe all possible interactions. Give your health care provider a list of all the medicines, herbs, non-prescription drugs, or dietary supplements you use. Also tell them if you smoke, drink alcohol, or use illegal drugs. Some items may interact with your medicine. What should I watch for while using this medicine? Visit your doctor or health care professional for regular checks on your progress. You will need frequent blood checks during the first few months you are receiving the medicine. This medicine can make you more sensitive to the sun. Keep out of the sun. If you cannot avoid being in the sun, wear protective clothing and use sunscreen. Do not use sun lamps or tanning beds/booths. This medicine can cause birth defects. Do not get pregnant while taking this drug. Females will need to have a negative pregnancy test before starting this medicine. If sexually active, use 2 reliable forms of birth control together for 4 weeks before starting this medicine, while you are taking this medicine, and for 6 weeks after you stop taking this medicine. Birth control pills alone may not work properly while you are taking this medicine. If you think that you might be pregnant talk to your doctor right away. If you get a cold or other  infection while receiving this medicine, call your doctor or health care professional. Do not treat yourself. The medicine may decrease your body's ability to fight infections. What side effects may I notice  from receiving this medicine? Side effects that you should report to your doctor or health care professional as soon as possible: -allergic reactions like skin rash, itching or hives, swelling of the face, lips, or tongue -bloody, dark, or tarry stools -changes in vision -dizziness -fever, chills or any other sign of infection -unusual bleeding or bruising -unusually weak or tired Side effects that usually do not require medical attention (report to your doctor or health care professional if they continue or are bothersome): -constipation -diarrhea -difficulty sleeping -loss of appetite -nausea, vomiting This list may not describe all possible side effects. Call your doctor for medical advice about side effects. You may report side effects to FDA at 1-800-FDA-1088. Where should I keep my medicine? Keep out of the reach of children. Store at room temperature between 15 and 30 degrees C (59 and 86 degrees F). Throw away any unused medicine after the expiration date. NOTE: This sheet is a summary. It may not cover all possible information. If you have questions about this medicine, talk to your doctor, pharmacist, or health care provider.  2018 Elsevier/Gold Standard (2007-10-02 09:25:30)  Sulfamethoxazole; Trimethoprim, SMX-TMP tablets What is this medicine? SULFAMETHOXAZOLE; TRIMETHOPRIM or SMX-TMP (suhl fuh meth OK suh zohl; trye METH oh prim) is a combination of a sulfonamide antibiotic and a second antibiotic, trimethoprim. It is used to treat or prevent certain kinds of bacterial infections. It will not work for colds, flu, or other viral infections. This medicine may be used for other purposes; ask your health care provider or pharmacist if you have questions. COMMON BRAND NAME(S): Bacter-Aid DS, Bactrim, Bactrim DS, Septra, Septra DS What should I tell my health care provider before I take this medicine? They need to know if you have any of these  conditions: -anemia -asthma -being treated with anticonvulsants -if you frequently drink alcohol containing drinks -kidney disease -liver disease -low level of folic acid or [id number]-[id number] dehydrogenase -poor nutrition or malabsorption -porphyria -severe allergies -thyroid disorder -an unusual or allergic reaction to sulfamethoxazole, trimethoprim, sulfa drugs, other medicines, foods, dyes, or preservatives -pregnant or trying to get pregnant -breast-feeding How should I use this medicine? Take this medicine by mouth with a full glass of water. Follow the directions on the prescription label. Take your medicine at regular intervals. Do not take it more often than directed. Do not skip doses or stop your medicine early. Talk to your pediatrician regarding the use of this medicine in children. Special care may be needed. This medicine has been used in children as young as 99 months of age. Overdosage: If you think you have taken too much of this medicine contact a poison control center or emergency room at once. NOTE: This medicine is only for you. Do not share this medicine with others. What if I miss a dose? If you miss a dose, take it as soon as you can. If it is almost time for your next dose, take only that dose. Do not take double or extra doses. What may interact with this medicine? Do not take this medicine with any of the following medications: -aminobenzoate potassium -dofetilide -metronidazole This medicine may also interact with the following medications: -ACE inhibitors like benazepril, enalapril, lisinopril, and ramipril -birth control pills -cyclosporine -digoxin -diuretics -indomethacin -medicines for diabetes -methenamine -methotrexate -phenytoin -potassium supplements -  pyrimethamine -sulfinpyrazone -tricyclic antidepressants -warfarin This list may not describe all possible interactions. Give your health care provider a list of all the medicines, herbs,  non-prescription drugs, or dietary supplements you use. Also tell them if you smoke, drink alcohol, or use illegal drugs. Some items may interact with your medicine. What should I watch for while using this medicine? Tell your doctor or health care professional if your symptoms do not improve. Drink several glasses of water a day to reduce the risk of kidney problems. Do not treat diarrhea with over the counter products. Contact your doctor if you have diarrhea that lasts more than 2 days or if it is severe and watery. This medicine can make you more sensitive to the sun. Keep out of the sun. If you cannot avoid being in the sun, wear protective clothing and use a sunscreen. Do not use sun lamps or tanning beds/booths. What side effects may I notice from receiving this medicine? Side effects that you should report to your doctor or health care professional as soon as possible: -allergic reactions like skin rash or hives, swelling of the face, lips, or tongue -breathing problems -fever or chills, sore throat -irregular heartbeat, chest pain -joint or muscle pain -pain or difficulty passing urine -red pinpoint spots on skin -redness, blistering, peeling or loosening of the skin, including inside the mouth -unusual bleeding or bruising -unusually weak or tired -yellowing of the eyes or skin Side effects that usually do not require medical attention (report to your doctor or health care professional if they continue or are bothersome): -diarrhea -dizziness -headache -loss of appetite -nausea, vomiting -nervousness This list may not describe all possible side effects. Call your doctor for medical advice about side effects. You may report side effects to FDA at 1-800-FDA-1088. Where should I keep my medicine? Keep out of the reach of children. Store at room temperature between 20 to 25 degrees C (68 to 77 degrees F). Protect from light. Throw away any unused medicine after the expiration  date. NOTE: This sheet is a summary. It may not cover all possible information. If you have questions about this medicine, talk to your doctor, pharmacist, or health care provider.  2018 Elsevier/Gold Standard (2012-10-27 14:38:26)

## 2016-08-11 LAB — IGG, IGA, IGM
IGG (IMMUNOGLOBIN G), SERUM: 1525 mg/dL (ref 694–1618)
IgA: 636 mg/dL — ABNORMAL HIGH (ref 81–463)
IgM, Serum: 40 mg/dL — ABNORMAL LOW (ref 48–271)

## 2016-08-11 LAB — IRON AND TIBC
%SAT: 9 % — AB (ref 15–60)
IRON: 37 ug/dL — AB (ref 50–180)
TIBC: 421 ug/dL (ref 250–425)
UIBC: 384 ug/dL (ref 125–400)

## 2016-08-11 LAB — VITAMIN B12: VITAMIN B 12: 253 pg/mL (ref 200–1100)

## 2016-08-11 LAB — FOLATE: Folate: 17.6 ng/mL (ref >5.4)

## 2016-08-11 LAB — FERRITIN: Ferritin: 8 ng/mL — ABNORMAL LOW (ref 20–380)

## 2016-08-12 LAB — PROTEIN ELECTROPHORESIS, SERUM, WITH REFLEX
ALPHA-1-GLOBULIN: 0.4 g/dL — AB (ref 0.2–0.3)
ALPHA-2-GLOBULIN: 1.1 g/dL — AB (ref 0.5–0.9)
Albumin ELP: 3.8 g/dL (ref 3.8–4.8)
BETA 2: 0.7 g/dL — AB (ref 0.2–0.5)
Beta Globulin: 0.6 g/dL (ref 0.4–0.6)
GAMMA GLOBULIN: 1.4 g/dL (ref 0.8–1.7)
TOTAL PROTEIN, SERUM ELECTROPHOR: 7.9 g/dL (ref 6.1–8.1)

## 2016-08-12 LAB — GLUCOSE 6 PHOSPHATE DEHYDROGENASE: G-6PDH: 20.9 U/g{Hb} — AB (ref 7.0–20.5)

## 2016-08-12 NOTE — Progress Notes (Signed)
Okay to start CellCept 500 mg by mouth daily for 1 week and then increase it to 1 tablet by mouth twice a day. Please check labs CBC and CMP every week 1 month and then every 2 months. He will need prophylaxis sulfamethizole/trimethoprim 400 mg/80 mg 1 tablet 3 times a week

## 2016-08-12 NOTE — Progress Notes (Signed)
Plan was to start CellCept.  Okay to start CellCept?

## 2016-08-12 NOTE — Progress Notes (Signed)
Hemoglobin is better. Please for labs to his PCP.

## 2016-08-13 ENCOUNTER — Ambulatory Visit (INDEPENDENT_AMBULATORY_CARE_PROVIDER_SITE_OTHER): Payer: Medicare Other | Admitting: Neurology

## 2016-08-13 ENCOUNTER — Encounter: Payer: Self-pay | Admitting: Neurology

## 2016-08-13 ENCOUNTER — Telehealth: Payer: Self-pay | Admitting: Pharmacist

## 2016-08-13 VITALS — BP 108/60 | HR 64 | Ht 73.0 in | Wt 221.1 lb

## 2016-08-13 DIAGNOSIS — E0842 Diabetes mellitus due to underlying condition with diabetic polyneuropathy: Secondary | ICD-10-CM

## 2016-08-13 DIAGNOSIS — Z79899 Other long term (current) drug therapy: Secondary | ICD-10-CM

## 2016-08-13 MED ORDER — SULFAMETHOXAZOLE-TRIMETHOPRIM 400-80 MG PO TABS: 1.0000 | ORAL_TABLET | ORAL | 0 refills | Status: DC

## 2016-08-13 MED ORDER — MYCOPHENOLATE MOFETIL 500 MG PO TABS
500.0000 mg | ORAL_TABLET | Freq: Every day | ORAL | 0 refills | Status: DC
Start: 2016-08-13 — End: 2016-08-16

## 2016-08-13 MED ORDER — DULOXETINE HCL 30 MG PO CPEP: 30.0000 mg | ORAL_CAPSULE | Freq: Every day | ORAL | 5 refills | Status: DC

## 2016-08-13 NOTE — Progress Notes (Signed)
Follow-up Visit   Date: 08/13/16    [name] MRN: [id number] DOB: Jul 17, 1931   Interim History: [name] is a 83 y.o. right-handed Caucasian male with CAD, GERD, interstitial lung disease, rheumatoid arthritis (on imuran), aortic stenosis, CKD stage 3, hyperlipidemia, and hypertension returning to the clinic for follow-up of neuropathy.  The patient was accompanied to the clinic by self.  History of present illness: He was diagnosed with diabetes about 10-15 years ago and recall havinghaving burning pain involving the toes. Since then, there has been gradual worsening in intensity of pain and it now also involves the dorsum of the foot. There is no weakness, imbalance, or gait difficulty. He still walks independently and denies any falls. He takes gabapentin 97m in the morning, 6067min the afternoon, and 60038mt bedtime which provides about 50% relief. He has previously tried nortriptyline but does not recall whether it did not work or stopped it because of side effects. He was tried on Cymbalta but this made him very dizzy and sleepy. He has seen podiatry for this and had steroid injections for capsulitis which did not provide much relief. He takes imuran for rheumatoid arthritis. He has chronic low back pain and gets ESI several times per year.   UPDATE 09/24/2015:  Unfortunately, there was no benefit with desimpramine 55m66m bedtime.  He reports tripping over his oxygen cord an fell about a month ago, but otherwise walks independently and denies imbalance.  His feet continues to be most painful and bothersome.    UPDATE 12/25/2015:  He only had mild benefit with lidocaine and none with Aspercream, so discontinued both.  The pain is in his feet is unchanged and continues to be bothersome, especially at night.  He would like to try Lyrica.  Despite my recommendations, he reports to taking gabapentin 900mg22mthe morning, 600mg 58mhe afternoon, and 1200mg a43medtime.  His GFR is 35 and certainly should not be on this dosage of gabapentin.  He is not feeling very well today because of diarrhea.  UPDATE 04/30/2016:  He reports having 50% improvement with Lyrica 100mg BI46mUnfortunately, he was unable to tolerate a higher dose due to leg edema. He discontinued gabapentin and did not appreciate any worsening pain as he tapered this. It is fair to say, this was ineffective for him.  The shooting pain is still very severe and he would like to try alternative options.    UPDATE 08/13/2016:  He is here for follow-up.  Unfortunately, carbamazepine made him too dizzy so it was discontinued.  He is back on Lyrica 100mg twi42maily, which helps some but it is too expensive and he would like to stop it.  His neuropathy continues to be very painful.  No new weakness or falls.  Medications:  Current Outpatient Prescriptions on File Prior to Visit  Medication Sig Dispense Refill  . amLODipine (NORVASC) 10 MG tablet TAKE 1 TABLET BY MOUTH DAILY 90 tablet 3  . aspirin EC 81 MG tablet Take 81 mg by mouth at bedtime.    . atorvasMarland Kitchenatin (LIPITOR) 80 MG tablet Take 1 tablet (80 mg total) by mouth daily at 6 PM. 30 tablet 11  . Blood Glucose Monitoring Suppl (ONE TOUCH ULTRA 2) w/Device KIT Use to obtain blood sugar daily. Dx Code E11.40 1 each 0  . carvedilol (COREG) 12.5 MG tablet TAKE 1 TABLET BY MOUTH TWICE A DAY WITH MEALS 60 tablet 11  . clopidogrel (  PLAVIX) 75 MG tablet Take 1 tablet (75 mg total) by mouth daily with breakfast. 30 tablet 11  . DHA-Vitamin C-Lutein (EYE HEALTH FORMULA PO) Take 1 tablet by mouth daily.     . Fluticasone-Umeclidin-Vilant (TRELEGY ELLIPTA) 100-62.5-25 MCG/INH AEPB Inhale 1 puff into the lungs daily. 28 each 0  . furosemide (LASIX) 40 MG tablet TAKE 1 OR 2 TABLETS BY MOUTH DAILY 60 tablet 10  . glucose blood (ONE TOUCH ULTRA TEST) test strip USE TO CHECK BLOOD SUGAR ONCE A DAY Dx Code E11.40 100 each 3  . Glycopyrrolate-Formoterol (BEVESPI  AEROSPHERE) 9-4.8 MCG/ACT AERO Inhale 2 puffs into the lungs 2 (two) times daily. 1 Inhaler 0  . isosorbide mononitrate (IMDUR) 30 MG 24 hr tablet Take 1 tablet (30 mg total) by mouth daily. 90 tablet 3  . losartan (COZAAR) 100 MG tablet TAKE 1 TABLET BY MOUTH DAILY 90 tablet 3  . metFORMIN (GLUCOPHAGE) 1000 MG tablet TAKE 1 TABLET BY MOUTH TWICE A DAY 60 tablet 10  . Multiple Vitamin (MULTIVITAMIN WITH MINERALS) TABS tablet Take 1 tablet by mouth daily.    . nitroGLYCERIN (NITROSTAT) 0.4 MG SL tablet DISSOLVE 1 TABLET UNDER THE TONGUE FOR CHEST PAIN. MAY REPEAT EVERY 5MINUTES UP TO 3 DOSES. IF NO RELIEF, CALL 911** 25 tablet 5  . ONETOUCH DELICA LANCETS 19E MISC     . pregabalin (LYRICA) 100 MG capsule Take 1 capsule (100 mg total) by mouth 2 (two) times daily. (Patient taking differently: Take 100 mg by mouth daily. ) 60 capsule 5   No current facility-administered medications on file prior to visit.     Allergies:  Allergies  Allergen Reactions  . Doxazosin Mesylate Other (See Comments)    dizziness  . Methocarbamol Rash    Review of Systems:  CONSTITUTIONAL: No fevers, chills, night sweats, or weight loss.  EYES: No visual changes or eye pain ENT: No hearing changes.  No history of nose bleeds.   RESPIRATORY: No cough, wheezing and shortness of breath.   CARDIOVASCULAR: Negative for chest pain, and palpitations.   GI: +for abdominal discomfort, blood in stools or black stools.  No recent change in bowel habits.   GU:  No history of incontinence.   MUSCLOSKELETAL: No history of joint pain or swelling.  No myalgias.   SKIN: Negative for lesions, rash, and itching.   ENDOCRINE: Negative for cold or heat intolerance, polydipsia or goiter.   PSYCH:  No depression or anxiety symptoms.   NEURO: As Above.   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 - Previously tried medications: gabapentin, nortriptyline, despiramine, lidocaine ointment, Aspercream, carbamazepine - Continue Lyrica 161m BID - Start cymbalta 342mdaily - Continue alpha-lipoic acid 6003m - Consider pain management referral, if Cymbalta does not help  Return to clinic in 4 months.   The duration of this appointment visit was 15 minutes of face-to-face time with the patient.  Greater than 50% of this time was spent in counseling, explanation of diagnosis, planning of further management, and coordination of care.   Thank you for allowing me to participate in patient's care.  If I can answer any additional questions, I would be pleased to do so.    Sincerely,    Colbey Wirtanen K. Posey Pronto, DO

## 2016-08-13 NOTE — Patient Instructions (Addendum)
1.  Reduce Lyrica 100mg  daily for one week, then stop. 2.  Start Cymbalta 30mg  daily 3.  Call with an update in 1 month to let me know how you are doing.  If the pain has not improved, we may need to refer you to pain management for their opinion  Return to clinic in 4 months

## 2016-08-13 NOTE — Telephone Encounter (Addendum)
I spoke to patient and discussed the plan.  Counseled patient to discontinue azathiprine, start CellCept 500 mg daily for one week, then increase to 500 mg BID, and start sulfamethoxazole/trimethoprim 400 mg/80 mg three times weekly.  Counseled patient on the lab monitoring plan of weekly labs for 1 month then every 2 weeks.    Patient wrote down all instructions and was able to verbalize the plan back to me.    Patient was advised to schedule follow up in 1 month with Dr. Estanislado Pandy (patient scheduled appointment for 09/14/16).   Patient denies any questions or concerns at this time.   Elisabeth Most, Pharm.D., BCPS, CPP Clinical Pharmacist Pager: [phone] Phone: [phone] 08/13/2016 11:24 AM

## 2016-08-13 NOTE — Telephone Encounter (Signed)
-----   Message from [name], MD sent at 08/12/2016  4:59 PM EDT ----- Faythe Ghee to start CellCept 500 mg by mouth daily for 1 week and then increase it to 1 tablet by mouth twice a day. Please check labs CBC and CMP every week 1 month and then every 2 months. He will need prophylaxis sulfamethizole/trimethoprim 400 mg/80 m g 1 tablet 3 times a week

## 2016-08-13 NOTE — Telephone Encounter (Signed)
History of repeat hemoglobin is better. We decided to start him on CellCept and Bactrim at low-dose. He discontinued Imuran. We will monitor his labs closely .Thank you

## 2016-08-15 DIAGNOSIS — J8410 Pulmonary fibrosis, unspecified: Secondary | ICD-10-CM | POA: Diagnosis not present

## 2016-08-15 DIAGNOSIS — R0689 Other abnormalities of breathing: Secondary | ICD-10-CM | POA: Diagnosis not present

## 2016-08-15 NOTE — Telephone Encounter (Signed)
I saw that Thanks for keeping me up to date

## 2016-08-16 ENCOUNTER — Telehealth: Payer: Self-pay

## 2016-08-16 ENCOUNTER — Telehealth: Payer: Self-pay | Admitting: Pharmacist

## 2016-08-16 NOTE — Telephone Encounter (Signed)
Received a prior auth request for Cellcept through cover my meds. Submitted auth. Will update once we receive a response.   [name], [place], CPhT 9:36 AM

## 2016-08-16 NOTE — Telephone Encounter (Signed)
Prior authorization request for mycophenolate mofetil (CellCept) was denied "because the use is not supported by the Food and Drug Administration (FDA) or by one of the Medicare approved references for treating your medical condition(s): Interstitial lung disease."    Please advise.

## 2016-08-16 NOTE — Telephone Encounter (Signed)
Noted  

## 2016-08-16 NOTE — Telephone Encounter (Signed)
FYI: Pt's insurance declined Cellcept. I have requested Dr. Chase Caller to take over Cellcept rx as it might get approved if rxd by a pulmonologist. Pt. Will continue Imuran for now.

## 2016-08-16 NOTE — Telephone Encounter (Signed)
I updated patient on the denial of Cellcept by his insurance company.  Patient has not started CellCept or Bactrim.  Counseled he does not need Bactrim if he is not on CellCept.   Advised him to continue taking Imuran at this time unless told differently by Dr. Golden Pop office.  Patient voiced understanding.

## 2016-08-16 NOTE — Telephone Encounter (Signed)
We could not get the patient approved for Cellcept. They want data to support the use of Cellcept in ILD. I would appreciate if you could have your office work on it and rx it. He will continue Imuran in the mean time.

## 2016-08-17 ENCOUNTER — Ambulatory Visit (INDEPENDENT_AMBULATORY_CARE_PROVIDER_SITE_OTHER): Payer: Medicare Other | Admitting: Family Medicine

## 2016-08-17 ENCOUNTER — Encounter: Payer: Self-pay | Admitting: Family Medicine

## 2016-08-17 VITALS — BP 122/58 | HR 68 | Temp 97.6°F | Wt 213.5 lb

## 2016-08-17 DIAGNOSIS — R197 Diarrhea, unspecified: Secondary | ICD-10-CM

## 2016-08-17 LAB — CBC WITH DIFFERENTIAL/PLATELET
BASOS ABS: 0.1 10*3/uL (ref 0.0–0.1)
Basophils Relative: 1 % (ref 0.0–3.0)
EOS PCT: 2.9 % (ref 0.0–5.0)
Eosinophils Absolute: 0.2 10*3/uL (ref 0.0–0.7)
HCT: 28.7 % — ABNORMAL LOW (ref 39.0–52.0)
HEMOGLOBIN: 9.4 g/dL — AB (ref 13.0–17.0)
Lymphocytes Relative: 18.7 % (ref 12.0–46.0)
Lymphs Abs: 1.5 10*3/uL (ref 0.7–4.0)
MCHC: 32.7 g/dL (ref 30.0–36.0)
MCV: 74.4 fl — AB (ref 78.0–100.0)
MONO ABS: 0.7 10*3/uL (ref 0.1–1.0)
MONOS PCT: 8.4 % (ref 3.0–12.0)
Neutro Abs: 5.7 10*3/uL (ref 1.4–7.7)
Neutrophils Relative %: 69 % (ref 43.0–77.0)
Platelets: 317 10*3/uL (ref 150.0–400.0)
RBC: 3.85 Mil/uL — AB (ref 4.22–5.81)
RDW: 18.2 % — ABNORMAL HIGH (ref 11.5–15.5)
WBC: 8.3 10*3/uL (ref 4.0–10.5)

## 2016-08-17 LAB — COMPREHENSIVE METABOLIC PANEL
ALBUMIN: 4.2 g/dL (ref 3.5–5.2)
ALK PHOS: 60 U/L (ref 39–117)
ALT: 8 U/L (ref 0–53)
AST: 15 U/L (ref 0–37)
BUN: 29 mg/dL — ABNORMAL HIGH (ref 6–23)
CO2: 29 mEq/L (ref 19–32)
Calcium: 10.1 mg/dL (ref 8.4–10.5)
Chloride: 101 mEq/L (ref 96–112)
Creatinine, Ser: 2.3 mg/dL — ABNORMAL HIGH (ref 0.40–1.50)
GFR: 29.01 mL/min — AB (ref >60.00)
Glucose, Bld: 148 mg/dL — ABNORMAL HIGH (ref 70–99)
POTASSIUM: 4.2 meq/L (ref 3.5–5.1)
Sodium: 139 mEq/L (ref 135–145)
TOTAL PROTEIN: 8.1 g/dL (ref 6.0–8.3)
Total Bilirubin: 0.6 mg/dL (ref 0.2–1.2)

## 2016-08-17 NOTE — Patient Instructions (Addendum)
Skip the amlodipine for now, until you are not lightheaded.  Cut the metformin back to 1 pill a day for now and see if that helps the diarrhea.  Update [place] about your BP in about 1 week, sooner if needed.  Go to the lab on the way out.  We'll contact you with your lab report. Take care.  Glad to see you.

## 2016-08-17 NOTE — Progress Notes (Signed)
Nausea and diarrhea for a few days.  He feels weak in general.  No vomiting.  No fevers known/documented but had chills and felt hot.  No rash.  Diarrhea is getting better.  No one else is sick. Prev with about 2 episodes of diarrhea per days.  Some lower mild abd pain, not severe, present most of the time.  No new foods or atypical foods.   On cymbalta recently- he just started that med, unclear if that was causative.  He stopped it in the meantime.    Has been lightheaded for about 2 weeks.    Pulse 68 on MD check.   Meds, vitals, and allergies reviewed.   ROS: Per HPI unless specifically indicated in ROS section   GEN: nad, alert and oriented HEENT: mucous membranes moist NECK: supple w/o LA CV: rrr.  PULM: ctab, no inc wob ABD: soft, +bs, no rebound, not ttp EXT: trace BLE edema SKIN: no acute rash

## 2016-08-18 ENCOUNTER — Other Ambulatory Visit: Payer: Self-pay | Admitting: Family Medicine

## 2016-08-18 DIAGNOSIS — R197 Diarrhea, unspecified: Secondary | ICD-10-CM | POA: Insufficient documentation

## 2016-08-18 NOTE — Assessment & Plan Note (Signed)
Unclear source. This could be infectious. He is nontoxic. The diarrhea is getting some better. Would still recheck routine labs today. See notes on labs.  Unclear if this is related to the Cymbalta. He has stopped the medication in the meantime. Unclear if he was getting better on his on or if he is getting better from stopping the medication. Discussed with patient.  He has been lightheaded in the meantime. Skip the amlodipine for now, until he is not lightheaded. He'll update [place] about BP in about 1 week, sooner if needed.  Okay to cut the metformin back to 1 pill a day for now and see if that helps the diarrhea.    At this point still okay for outpatient follow-up. See notes on labs.

## 2016-08-18 NOTE — Telephone Encounter (Signed)
[name]  I have gotten approval by citing this article. beause he has connetive tissu ILD. Are oyu able to to do the letter? Or want me to?   THanks  Dr R  [email or web address] J Rheumatol. 2013 May;40(5):640-6. doi: 10.3899/[email or web address]. Epub 2013 Mar 1. Mycophenolate mofetil improves lung function in connective tissue disease-associated interstitial lung disease. [place] Wannetta Sender, Marylou Flesher, [place] RM, [place], [place] [facility], [place] [facility], [place], [place], [place] RT, [place], [place], [place].

## 2016-08-23 ENCOUNTER — Encounter (HOSPITAL_COMMUNITY): Payer: Self-pay | Admitting: Emergency Medicine

## 2016-08-23 ENCOUNTER — Ambulatory Visit (HOSPITAL_COMMUNITY)
Admission: EM | Admit: 2016-08-23 | Discharge: 2016-08-23 | Disposition: A | Discharge status: Home / Self Care | Payer: Medicare Other | Attending: Family Medicine | Admitting: Family Medicine

## 2016-08-23 DIAGNOSIS — R531 Weakness: Secondary | ICD-10-CM | POA: Diagnosis not present

## 2016-08-23 DIAGNOSIS — R110 Nausea: Secondary | ICD-10-CM

## 2016-08-23 MED ORDER — ONDANSETRON 4 MG PO TBDP
4.0000 mg | ORAL_TABLET | Freq: Once | ORAL | Status: AC
  Administered 2016-08-23: 4 mg via ORAL

## 2016-08-23 MED ORDER — ONDANSETRON 4 MG PO TBDP: 4.0000 mg | ORAL_TABLET | Freq: Three times a day (TID) | ORAL | 0 refills | Status: DC | PRN

## 2016-08-23 MED ORDER — ONDANSETRON 4 MG PO TBDP
ORAL_TABLET | ORAL | Status: AC
  Filled 2016-08-23: qty 1

## 2016-08-23 NOTE — ED Triage Notes (Signed)
Weakness, nausea, tired.

## 2016-08-23 NOTE — Discharge Instructions (Signed)
Please call and schedule and appointment to be seen by your PCP who has just recently stopped medicine/ adjusted your medicine for chronic medical problems.

## 2016-08-23 NOTE — ED Provider Notes (Signed)
CSN: [id number]     Arrival date & time 08/23/16  1049 History   First MD Initiated Contact with Patient 08/23/16 1128     Chief Complaint  Patient presents with  . Nausea  . Fatigue   (Consider location/radiation/quality/duration/timing/severity/associated sxs/prior Treatment) C/o nausea and fatigue   The history is provided by the patient.  Emesis  Severity:  Mild Duration:  2 days Timing:  Intermittent Chronicity:  New Relieved by:  Nothing Worsened by:  Nothing Associated symptoms: diarrhea     Past Medical History:  Diagnosis Date  . Arthritis    osteoarthritis, s/p R TKR, and digits  . CAD (coronary artery disease)    a. s/p CABG (2001)  b. s/p DES to RCA and cutting POBA to ostial PDA (2013)   c. s/p DES to SVG to OM2 (01/14/14) d. cath: 08/2015 NSTEMI w/ patent LIMA-LAD and 99% stenosis of SVG-OM w/ DES placed. CTO of SVG-RCA and SVG-D1.   [name] Chronic diastolic CHF (congestive heart failure) ([place])    a) 09/13 ECHO- LVEF 40-98%, grade 1 diastolic dysfunction, mild LA dilatation, atrial septal aneurysm, AV mobility restricted, but no sig AS by doppler; b) 09/04/08 ECHO- LVH, ef 60%, mild AS, c. echo 08/2015: EF perserved of 55-60% with inferolateral HK. Mild AS noted.  . Chronic kidney disease, stage III (moderate)   . Chronic lower back pain   . Colon polyps   . Diverticulosis   . Dyspnea 2009 since July -Sept   05/06/08-CPST-  normal effort, reduced VO2 max 20.5 /65%, reduced at 8.2/ 40%, normal breathing resetvca of 55%, submaximal heart rate response 112/77%, flattened o2 pluse response at peak exercise-12 ml/beat @ 85%, No VQ mismatch abnormalities, All c/w CIRC Limitation  . Enlarged prostate   . Esophageal stricture    a. s/p dilation spring 2010  . GERD (gastroesophageal reflux disease)   . Heart murmur   . Hiatal hernia   . History of PFTs    mixed pattern on spiro. mild restn on lung volumes with near normal DLCO. Pattern can be explained by CABG scar. Fev1  2.2L/73%, ratio 68 (67), TLC 4.7/68%,RV 1.5L/55%,DLCO 79%  . Hyperlipidemia   . Hypertension   . Interstitial lung disease ([name])    NOS  . Overweight (BMI 25.0-29.9)    BMI 29  . RA (rheumatoid arthritis) ([name])    [name]  . Seropositive rheumatoid arthritis ([place])   . Type II diabetes mellitus ([place])    Past Surgical History:  Procedure Laterality Date  . CARDIAC CATHETERIZATION  08/2004   CP- no MI, Cath- small vessell disease   . CARDIAC CATHETERIZATION  12/31/2011   80% distal LM, 100% native LAD, LCx and RCA, 30% prox SVG-OM, SVG-D1 normal, 99% distal, 80% ostial SVG-RCA distal to graft, LIMA-LAD normal; LVEF mildly decreased with posterior basal AK   . CARDIAC CATHETERIZATION  2009   with patent grafts/notes 12/31/2011  . CARDIAC CATHETERIZATION N/A 08/13/2015   Procedure: Left Heart Cath and Cors/Grafts Angiography;  Surgeon: Sherren Mocha, MD;  Location: [place] [facility];  Service: Cardiovascular;  Laterality: N/A;  . CATARACT EXTRACTION W/ INTRAOCULAR LENS  IMPLANT, BILATERAL Bilateral   . CHOLECYSTECTOMY OPEN  11/2003   [name]  . CORONARY ANGIOPLASTY WITH STENT PLACEMENT  01/03/2012   Successful DES to SVG-RCA and cutting balloon angioplasty ostial  PDA   . CORONARY ANGIOPLASTY WITH STENT PLACEMENT  01/14/2014   "1"  . CORONARY ARTERY BYPASS GRAFT  11/1999   CABG X5  .  CORONARY STENT PLACEMENT  02/2012   1 stent and balloon  . ESOPHAGOGASTRODUODENOSCOPY (EGD) WITH ESOPHAGEAL DILATION  2010  . HAND SURGERY    . JOINT REPLACEMENT    . KNEE ARTHROSCOPY Right 2008  . LEFT AND RIGHT HEART CATHETERIZATION WITH CORONARY ANGIOGRAM  12/31/2011   Procedure: LEFT AND RIGHT HEART CATHETERIZATION WITH CORONARY ANGIOGRAM;  Surgeon: Burnell Blanks, MD;  Location: [facility] CATH LAB;  Service: Cardiovascular;;  . LEFT AND RIGHT HEART CATHETERIZATION WITH CORONARY ANGIOGRAM N/A 01/14/2014   Procedure: LEFT AND RIGHT HEART CATHETERIZATION WITH CORONARY ANGIOGRAM;  Surgeon: Peter M  Martinique, MD;  Location: [facility] CATH LAB;  Service: Cardiovascular;  Laterality: N/A;  . PERCUTANEOUS CORONARY INTERVENTION-BALLOON ONLY  01/03/2012   Procedure: PERCUTANEOUS CORONARY INTERVENTION-BALLOON ONLY;  Surgeon: Peter M Martinique, MD;  Location: [facility] CATH LAB;  Service: Cardiovascular;;  . PERCUTANEOUS CORONARY STENT INTERVENTION (PCI-S)  12/31/2011   Procedure: PERCUTANEOUS CORONARY STENT INTERVENTION (PCI-S);  Surgeon: Burnell Blanks, MD;  Location: [facility] CATH LAB;  Service: Cardiovascular;;  . PERCUTANEOUS CORONARY STENT INTERVENTION (PCI-S) N/A 01/03/2012   Procedure: PERCUTANEOUS CORONARY STENT INTERVENTION (PCI-S);  Surgeon: Peter M Martinique, MD;  Location: [facility] CATH LAB;  Service: Cardiovascular;  Laterality: N/A;  . SHOULDER ARTHROSCOPY WITH OPEN ROTATOR CUFF REPAIR AND DISTAL CLAVICLE ACROMINECTOMY Left 02/27/2013   Procedure: LEFT SHOULDER ARTHROSCOPY WITH MINI OPEN ROTATOR CUFF REPAIR AND SUBACROMIAL DECOMPRESSION AND DISTAL CLAVICLE RESECTION;  Surgeon: [name], MD;  Location: [place];  Service: Orthopedics;  Laterality: Left;  . TOTAL KNEE ARTHROPLASTY Right 03/2010   [name]  . TRIGGER FINGER RELEASE Left 02/27/2013   Procedure: RELEASE TRIGGER FINGER/A-1 PULLEY;  Surgeon: [name], MD;  Location: [place];  Service: Orthopedics;  Laterality: Left;   Family History  Problem Relation Age of Onset  . COPD Mother   . Heart disease Father   . Heart attack Father   . Diabetes Brother   . Colon cancer Brother 25  . Alcohol abuse Sister   . Stroke Sister    Social History  Substance Use Topics  . Smoking status: Former Smoker    Packs/day: 1.00    Years: 20.00    Types: Cigarettes    Quit date: 04/06/1963  . Smokeless tobacco: Never Used  . Alcohol use No     Comment: 01/01/2012 "last alcohol ~ 50 yr ago"    Review of Systems  Constitutional: Positive for fatigue.  HENT: Negative.   Eyes: Negative.   Respiratory: Negative.   Cardiovascular: Negative.    Gastrointestinal: Positive for diarrhea and vomiting.  Endocrine: Negative.   Genitourinary: Negative.   Musculoskeletal: Negative.   Allergic/Immunologic: Negative.   Neurological: Negative.   Hematological: Negative.   Psychiatric/Behavioral: Negative.     Allergies  Metformin and related; Doxazosin mesylate; and Methocarbamol  Home Medications   Prior to Admission medications   Medication Sig Start Date End Date Taking? Authorizing Provider  amLODipine (NORVASC) 10 MG tablet TAKE 1 TABLET BY MOUTH DAILY 04/27/16   Viviana Simpler I, MD  aspirin EC 81 MG tablet Take 81 mg by mouth at bedtime.    [provider]  atorvastatin (LIPITOR) 80 MG tablet Take 1 tablet (80 mg total) by mouth daily at 6 PM. 01/09/16   [name], MD  azaTHIOprine (IMURAN) 50 MG tablet Take 50 mg by mouth 2 (two) times daily.    [provider]  Blood Glucose Monitoring Suppl (ONE TOUCH ULTRA 2) w/Device KIT Use to  obtain blood sugar daily. Dx Code E11.40 01/09/16   Viviana Simpler I, MD  carvedilol (COREG) 12.5 MG tablet TAKE 1 TABLET BY MOUTH TWICE A DAY WITH MEALS 01/09/16   [name], MD  clopidogrel (PLAVIX) 75 MG tablet Take 1 tablet (75 mg total) by mouth daily with breakfast. 01/09/16   [name], MD  DHA-Vitamin C-Lutein (EYE HEALTH FORMULA PO) Take 1 tablet by mouth daily.     [provider]  DULoxetine (CYMBALTA) 30 MG capsule Take 1 capsule (30 mg total) by mouth daily. Patient not taking: Reported on 08/18/2016 08/13/16   Narda Amber K, DO  furosemide (LASIX) 40 MG tablet TAKE 1 OR 2 TABLETS BY MOUTH DAILY 01/09/16   Viviana Simpler I, MD  glucose blood (ONE TOUCH ULTRA TEST) test strip USE TO CHECK BLOOD SUGAR ONCE A DAY Dx Code E11.40 01/09/16   [name], MD  isosorbide mononitrate (IMDUR) 30 MG 24 hr tablet Take 1 tablet (30 mg total) by mouth daily. 01/01/16   [name], MD  losartan (COZAAR) 100 MG tablet TAKE 1 TABLET BY MOUTH DAILY  04/27/16   [name], MD  Multiple Vitamin (MULTIVITAMIN WITH MINERALS) TABS tablet Take 1 tablet by mouth daily.    [provider]  nitroGLYCERIN (NITROSTAT) 0.4 MG SL tablet DISSOLVE 1 TABLET UNDER THE TONGUE FOR CHEST PAIN. MAY REPEAT EVERY 5MINUTES UP TO 3 DOSES. IF NO RELIEF, CALL 911** 07/28/15   [name], MD  ondansetron (ZOFRAN ODT) 4 MG disintegrating tablet Take 1 tablet (4 mg total) by mouth every 8 (eight) hours as needed for nausea or vomiting. 08/23/16   Lysbeth Penner, FNP  [facility] DELICA LANCETS 38V MISC  01/09/16   [provider]   Meds Ordered and Administered this Visit   Medications  ondansetron (ZOFRAN-ODT) disintegrating tablet 4 mg (4 mg Oral Given 08/23/16 1132)    BP (!) 135/56 (BP Location: Left Arm) Comment: rn notified  Pulse 63   Temp 98 F (36.7 C) (Oral)   Resp 14   SpO2 97%  No data found.   Physical Exam  Constitutional: He is oriented to person, place, and time. He appears well-developed and well-nourished.  HENT:  Head: Normocephalic and atraumatic.  Right Ear: External ear normal.  Left Ear: External ear normal.  Mouth/Throat: Oropharynx is clear and moist.  Eyes: Conjunctivae and EOM are normal. Pupils are equal, round, and reactive to light.  Neck: Normal range of motion. Neck supple.  Cardiovascular: Normal rate, regular rhythm and normal heart sounds.   Pulmonary/Chest: Effort normal and breath sounds normal.  Abdominal: Soft. Bowel sounds are normal.  Neurological: He is alert and oriented to person, place, and time.  Nursing note and vitals reviewed.   Urgent Care Course     Procedures (including critical care time)  Labs Review Labs Reviewed - No data to display  Imaging Review No results found.   Visual Acuity Review  Right Eye Distance:   Left Eye Distance:   Bilateral Distance:    Right Eye Near:   Left Eye Near:    Bilateral Near:         MDM   1. Weakness   2. Nausea     Zofran ODT '4mg'$  one po tid prn #21  Follow up with pcp    Lysbeth Penner, FNP 08/23/16 2046

## 2016-08-23 NOTE — Telephone Encounter (Signed)
Since we are treating for his lung disease. I think we will have the best chance of getting it approved if the letter comes from your office.  Thank you,  [name]

## 2016-08-25 ENCOUNTER — Encounter: Payer: Self-pay | Admitting: Family Medicine

## 2016-08-25 ENCOUNTER — Ambulatory Visit (INDEPENDENT_AMBULATORY_CARE_PROVIDER_SITE_OTHER): Payer: Medicare Other | Admitting: Family Medicine

## 2016-08-25 VITALS — BP 110/58 | HR 66 | Temp 97.6°F | Wt 210.0 lb

## 2016-08-25 DIAGNOSIS — E1122 Type 2 diabetes mellitus with diabetic chronic kidney disease: Secondary | ICD-10-CM

## 2016-08-25 DIAGNOSIS — E1140 Type 2 diabetes mellitus with diabetic neuropathy, unspecified: Secondary | ICD-10-CM

## 2016-08-25 DIAGNOSIS — I10 Essential (primary) hypertension: Secondary | ICD-10-CM

## 2016-08-25 DIAGNOSIS — R110 Nausea: Secondary | ICD-10-CM

## 2016-08-25 DIAGNOSIS — N183 Chronic kidney disease, stage 3 (moderate): Secondary | ICD-10-CM | POA: Diagnosis not present

## 2016-08-25 MED ORDER — LOSARTAN POTASSIUM 100 MG PO TABS: 50.0000 mg | ORAL_TABLET | Freq: Every day | ORAL | Status: DC

## 2016-08-25 NOTE — Patient Instructions (Signed)
Go to the lab on the way out.  We'll contact you with your lab report.  Stay off amlodipine.  Stop metformin totally.  Cut losartan back to 50mg  (1/2 tab) a day.   Use ondansetron for nausea.   Update [place] in about 2 days.  Take care.  Glad to see you.

## 2016-08-25 NOTE — Progress Notes (Signed)
L lateral subconjunctival hemorrhage noted.  No vision changes. No eye pain.  He is back on cymbalta without any changes.    Metformin and CKD d/w pt.  Last Cr. 2.3.  Is taking metformin once a day.  Was prev on BID dosing.  That seemed to help with diarrhea some.   Sugar in the ~140-150s recently.  Still off amlodipine at this point.    Had nausea and went to UC.  Given rx for zofran.  That med helped some.  No vomiting.  No diarrhea.  No fevers.    No bloody vomit or diarrhea.    Dorsal hands burning "like somebody poured [place] on them."  Started a few days ago.  Bilateral.  He had foot pain at baseline.    PMH and SH reviewed  ROS: Per HPI unless specifically indicated in ROS section   Meds, vitals, and allergies reviewed.   GEN: nad, alert and oriented HEENT: mucous membranes moist, L lateral subconjunctival hemorrhage noted.  NECK: supple w/o LA CV: rrr.  no murmur PULM: ctab, no inc wob ABD: soft, +bs, not tender to palpation. EXT: no edema SKIN: no acute rash

## 2016-08-26 ENCOUNTER — Encounter: Payer: Self-pay | Admitting: Family Medicine

## 2016-08-26 ENCOUNTER — Other Ambulatory Visit: Payer: Self-pay | Admitting: Family Medicine

## 2016-08-26 DIAGNOSIS — N189 Chronic kidney disease, unspecified: Secondary | ICD-10-CM

## 2016-08-26 LAB — COMPREHENSIVE METABOLIC PANEL
ALK PHOS: 62 U/L (ref 39–117)
ALT: 9 U/L (ref 0–53)
AST: 17 U/L (ref 0–37)
Albumin: 4.1 g/dL (ref 3.5–5.2)
BUN: 31 mg/dL — ABNORMAL HIGH (ref 6–23)
CO2: 28 mEq/L (ref 19–32)
Calcium: 10.1 mg/dL (ref 8.4–10.5)
Chloride: 98 mEq/L (ref 96–112)
Creatinine, Ser: 2.62 mg/dL — ABNORMAL HIGH (ref 0.40–1.50)
GFR: 24.96 mL/min — AB (ref >60.00)
GLUCOSE: 126 mg/dL — AB (ref 70–99)
POTASSIUM: 4.4 meq/L (ref 3.5–5.1)
Sodium: 135 mEq/L (ref 135–145)
TOTAL PROTEIN: 8 g/dL (ref 6.0–8.3)
Total Bilirubin: 0.6 mg/dL (ref 0.2–1.2)

## 2016-08-26 NOTE — Assessment & Plan Note (Signed)
Stop metformin. See above and below. He can update PCP with sugar readings future.

## 2016-08-26 NOTE — Assessment & Plan Note (Signed)
Would stop metformin. Recheck creatinine pending. Discussed with patient.

## 2016-08-26 NOTE — Telephone Encounter (Signed)
Daneil Dan  Please look at reference article below and do a letter thatn [name] Oct 02, 1931 has been on immuran for Rheumatoid ARthtritis ILD for years but he still has persistent dyspnea and ILD on CT scan. SO, he meets indication for mycophenolate mofetil. This is because based on article attached there is good evidence that this agent can slow down progression of ILD In Rheumatoid arthritis. Therefore, we appreciate approval of this agent  Dr. Brand Males, M.D., [facility].C.P Pulmonary and Critical Care Medicine Staff Physician [place] [facility] Pager: [phone], If no answer or between  15:00h - 7:00h: call [phone]  08/26/2016 4:32 PM

## 2016-08-26 NOTE — Assessment & Plan Note (Addendum)
Would not restart amlodipine yet. Given previous elevation in creatinine and relative decrease in oral intake, would cut ARB back to 50 mg. Discussed with patient. He agrees. >25 minutes spent in face to face time with patient, >50% spent in counselling or coordination of care.  See after visit summary

## 2016-08-26 NOTE — Assessment & Plan Note (Signed)
No diarrhea now. This may be due to metformin use. Stop metformin. See notes on labs. Use Zofran as needed. Okay for outpatient follow-up.

## 2016-08-27 NOTE — Telephone Encounter (Signed)
Letter generated Article printed to send with letter Placed in MR's lookat for signature

## 2016-08-31 ENCOUNTER — Other Ambulatory Visit (INDEPENDENT_AMBULATORY_CARE_PROVIDER_SITE_OTHER): Payer: Medicare Other

## 2016-08-31 DIAGNOSIS — N189 Chronic kidney disease, unspecified: Secondary | ICD-10-CM

## 2016-08-31 LAB — BASIC METABOLIC PANEL
BUN: 24 mg/dL — AB (ref 6–23)
CHLORIDE: 99 meq/L (ref 96–112)
CO2: 30 meq/L (ref 19–32)
CREATININE: 2 mg/dL — AB (ref 0.40–1.50)
Calcium: 9.7 mg/dL (ref 8.4–10.5)
GFR: 34.08 mL/min — ABNORMAL LOW (ref >60.00)
Glucose, Bld: 160 mg/dL — ABNORMAL HIGH (ref 70–99)
POTASSIUM: 4.3 meq/L (ref 3.5–5.1)
Sodium: 137 mEq/L (ref 135–145)

## 2016-09-03 ENCOUNTER — Ambulatory Visit (INDEPENDENT_AMBULATORY_CARE_PROVIDER_SITE_OTHER): Payer: Medicare Other | Admitting: Internal Medicine

## 2016-09-03 ENCOUNTER — Encounter: Payer: Self-pay | Admitting: Internal Medicine

## 2016-09-03 DIAGNOSIS — E1140 Type 2 diabetes mellitus with diabetic neuropathy, unspecified: Secondary | ICD-10-CM

## 2016-09-03 DIAGNOSIS — R110 Nausea: Secondary | ICD-10-CM | POA: Diagnosis not present

## 2016-09-03 DIAGNOSIS — I10 Essential (primary) hypertension: Secondary | ICD-10-CM | POA: Diagnosis not present

## 2016-09-03 MED ORDER — PANTOPRAZOLE SODIUM 40 MG PO TBEC: 40.0000 mg | DELAYED_RELEASE_TABLET | Freq: Two times a day (BID) | ORAL | 3 refills | Status: DC

## 2016-09-03 NOTE — Patient Instructions (Signed)
Please start the pantoprazole twice a day on an empty stomach. Let me know if you have any problems taking it.

## 2016-09-03 NOTE — Progress Notes (Signed)
Subjective:    Patient ID: [name], male    DOB: 12-27-1931, 83 y.o.   MRN: [id number]  HPI Here with wife for review of status Has been sick over the past month Nausea and lightheadedness Several visits--here and urgent care  Amlodipine stopped Some med for nausea from urgent care---helps a little Losartan and metformin also stopped This hasn't helped anything  No heartburn but notes some dysphagia Food and drink not going down right No meds for this---off PPI "a long time"  Current Outpatient Prescriptions on File Prior to Visit  Medication Sig Dispense Refill  . aspirin EC 81 MG tablet Take 81 mg by mouth at bedtime.    [name] atorvastatin (LIPITOR) 80 MG tablet Take 1 tablet (80 mg total) by mouth daily at 6 PM. 30 tablet 11  . azaTHIOprine (IMURAN) 50 MG tablet Take 50 mg by mouth 2 (two) times daily.    . Blood Glucose Monitoring Suppl (ONE TOUCH ULTRA 2) w/Device KIT Use to obtain blood sugar daily. Dx Code E11.40 1 each 0  . carvedilol (COREG) 12.5 MG tablet TAKE 1 TABLET BY MOUTH TWICE A DAY WITH MEALS 60 tablet 11  . clopidogrel (PLAVIX) 75 MG tablet Take 1 tablet (75 mg total) by mouth daily with breakfast. 30 tablet 11  . DHA-Vitamin C-Lutein (EYE HEALTH FORMULA PO) Take 1 tablet by mouth daily.     . DULoxetine (CYMBALTA) 30 MG capsule Take 1 capsule (30 mg total) by mouth daily. 30 capsule 5  . furosemide (LASIX) 40 MG tablet TAKE 1 OR 2 TABLETS BY MOUTH DAILY 60 tablet 10  . glucose blood (ONE TOUCH ULTRA TEST) test strip USE TO CHECK BLOOD SUGAR ONCE A DAY Dx Code E11.40 100 each 3  . isosorbide mononitrate (IMDUR) 30 MG 24 hr tablet Take 1 tablet (30 mg total) by mouth daily. 90 tablet 3  . Multiple Vitamin (MULTIVITAMIN WITH MINERALS) TABS tablet Take 1 tablet by mouth daily.    . nitroGLYCERIN (NITROSTAT) 0.4 MG SL tablet DISSOLVE 1 TABLET UNDER THE TONGUE FOR CHEST PAIN. MAY REPEAT EVERY 5MINUTES UP TO 3 DOSES. IF NO RELIEF, CALL 911** 25 tablet 5  .  ondansetron (ZOFRAN ODT) 4 MG disintegrating tablet Take 1 tablet (4 mg total) by mouth every 8 (eight) hours as needed for nausea or vomiting. 20 tablet 0  . ONETOUCH DELICA LANCETS 88E MISC      No current facility-administered medications on file prior to visit.     Allergies  Allergen Reactions  . Metformin And Related Other (See Comments)    Would avoid due to baseline Cr  . Doxazosin Mesylate Other (See Comments)    dizziness  . Methocarbamol Rash    Past Medical History:  Diagnosis Date  . Arthritis    osteoarthritis, s/p R TKR, and digits  . CAD (coronary artery disease)    a. s/p CABG (2001)  b. s/p DES to RCA and cutting POBA to ostial PDA (2013)   c. s/p DES to SVG to OM2 (01/14/14) d. cath: 08/2015 NSTEMI w/ patent LIMA-LAD and 99% stenosis of SVG-OM w/ DES placed. CTO of SVG-RCA and SVG-D1.   [name] Chronic diastolic CHF (congestive heart failure) ([place])    a) 09/13 ECHO- LVEF 28-00%, grade 1 diastolic dysfunction, mild LA dilatation, atrial septal aneurysm, AV mobility restricted, but no sig AS by doppler; b) 09/04/08 ECHO- LVH, ef 60%, mild AS, c. echo 08/2015: EF perserved of 55-60% with inferolateral HK. Mild AS  noted.  . Chronic kidney disease, stage III (moderate)   . Chronic lower back pain   . Colon polyps   . Diverticulosis   . Dyspnea 2009 since July -Sept   05/06/08-CPST-  normal effort, reduced VO2 max 20.5 /65%, reduced at 8.2/ 40%, normal breathing resetvca of 55%, submaximal heart rate response 112/77%, flattened o2 pluse response at peak exercise-12 ml/beat @ 85%, No VQ mismatch abnormalities, All c/w CIRC Limitation  . Enlarged prostate   . Esophageal stricture    a. s/p dilation spring 2010  . GERD (gastroesophageal reflux disease)   . Heart murmur   . Hiatal hernia   . History of PFTs    mixed pattern on spiro. mild restn on lung volumes with near normal DLCO. Pattern can be explained by CABG scar. Fev1 2.2L/73%, ratio 68 (67), TLC 4.7/68%,RV 1.5L/55%,DLCO  79%  . Hyperlipidemia   . Hypertension   . Interstitial lung disease ([name])    NOS  . Overweight (BMI 25.0-29.9)    BMI 29  . RA (rheumatoid arthritis) ([name])    [name]  . Seropositive rheumatoid arthritis ([place])   . Type II diabetes mellitus ([place])     Past Surgical History:  Procedure Laterality Date  . CARDIAC CATHETERIZATION  08/2004   CP- no MI, Cath- small vessell disease   . CARDIAC CATHETERIZATION  12/31/2011   80% distal LM, 100% native LAD, LCx and RCA, 30% prox SVG-OM, SVG-D1 normal, 99% distal, 80% ostial SVG-RCA distal to graft, LIMA-LAD normal; LVEF mildly decreased with posterior basal AK   . CARDIAC CATHETERIZATION  2009   with patent grafts/notes 12/31/2011  . CARDIAC CATHETERIZATION N/A 08/13/2015   Procedure: Left Heart Cath and Cors/Grafts Angiography;  Surgeon: Sherren Mocha, MD;  Location: [place] CV LAB;  Service: Cardiovascular;  Laterality: N/A;  . CATARACT EXTRACTION W/ INTRAOCULAR LENS  IMPLANT, BILATERAL Bilateral   . CHOLECYSTECTOMY OPEN  11/2003   [name]  . CORONARY ANGIOPLASTY WITH STENT PLACEMENT  01/03/2012   Successful DES to SVG-RCA and cutting balloon angioplasty ostial  PDA   . CORONARY ANGIOPLASTY WITH STENT PLACEMENT  01/14/2014   "1"  . CORONARY ARTERY BYPASS GRAFT  11/1999   CABG X5  . CORONARY STENT PLACEMENT  02/2012   1 stent and balloon  . ESOPHAGOGASTRODUODENOSCOPY (EGD) WITH ESOPHAGEAL DILATION  2010  . HAND SURGERY    . JOINT REPLACEMENT    . KNEE ARTHROSCOPY Right 2008  . LEFT AND RIGHT HEART CATHETERIZATION WITH CORONARY ANGIOGRAM  12/31/2011   Procedure: LEFT AND RIGHT HEART CATHETERIZATION WITH CORONARY ANGIOGRAM;  Surgeon: Burnell Blanks, MD;  Location: [facility] CATH LAB;  Service: Cardiovascular;;  . LEFT AND RIGHT HEART CATHETERIZATION WITH CORONARY ANGIOGRAM N/A 01/14/2014   Procedure: LEFT AND RIGHT HEART CATHETERIZATION WITH CORONARY ANGIOGRAM;  Surgeon: Peter M Martinique, MD;  Location: [facility] CATH LAB;  Service:  Cardiovascular;  Laterality: N/A;  . PERCUTANEOUS CORONARY INTERVENTION-BALLOON ONLY  01/03/2012   Procedure: PERCUTANEOUS CORONARY INTERVENTION-BALLOON ONLY;  Surgeon: Peter M Martinique, MD;  Location: [facility] CATH LAB;  Service: Cardiovascular;;  . PERCUTANEOUS CORONARY STENT INTERVENTION (PCI-S)  12/31/2011   Procedure: PERCUTANEOUS CORONARY STENT INTERVENTION (PCI-S);  Surgeon: Burnell Blanks, MD;  Location: [facility] CATH LAB;  Service: Cardiovascular;;  . PERCUTANEOUS CORONARY STENT INTERVENTION (PCI-S) N/A 01/03/2012   Procedure: PERCUTANEOUS CORONARY STENT INTERVENTION (PCI-S);  Surgeon: Peter M Martinique, MD;  Location: [facility] CATH LAB;  Service: Cardiovascular;  Laterality: N/A;  . SHOULDER ARTHROSCOPY WITH OPEN ROTATOR CUFF  REPAIR AND DISTAL CLAVICLE ACROMINECTOMY Left 02/27/2013   Procedure: LEFT SHOULDER ARTHROSCOPY WITH MINI OPEN ROTATOR CUFF REPAIR AND SUBACROMIAL DECOMPRESSION AND DISTAL CLAVICLE RESECTION;  Surgeon: [name], MD;  Location: [place];  Service: Orthopedics;  Laterality: Left;  . TOTAL KNEE ARTHROPLASTY Right 03/2010   [name]  . TRIGGER FINGER RELEASE Left 02/27/2013   Procedure: RELEASE TRIGGER FINGER/A-1 PULLEY;  Surgeon: [name], MD;  Location: [place];  Service: Orthopedics;  Laterality: Left;    Family History  Problem Relation Age of Onset  . COPD Mother   . Heart disease Father   . Heart attack Father   . Diabetes Brother   . Colon cancer Brother 25  . Alcohol abuse Sister   . Stroke Sister     Social History   Social History  . Marital status: Married    Spouse name: N/A  . Number of children: 3  . Years of education: N/A   Occupational History  . Conservation officer, nature Retired    retired   Social History Main Topics  . Smoking status: Former Smoker    Packs/day: 1.00    Years: 20.00    Types: Cigarettes    Quit date: 04/06/1963  . Smokeless tobacco: Never Used  . Alcohol use No     Comment: 01/01/2012 "last alcohol ~ 50 yr ago"  . Drug use: No   . Sexual activity: Not Currently   Other Topics Concern  . Not on file   Social History Narrative   No living will   Requests wife as health care POA   Discussed DNR --he requests this (done 08/29/12)   Not sure about feeding tube---but might accept for some time   Patient lives with wife and daughter in a one story home.  Has 3 children.  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   Review of Systems Never did get the cellcept approved--so still on azathioprine No fever Some hot sensation on hands and face Good appetite but just doesn't "want nothing" No diarrhea Checking sugar at times-- 175 fasting off the metformin. Lower than that at blood draws    Objective:   Physical Exam  Constitutional: He appears well-nourished. No distress.  Neck: No thyromegaly present.  Cardiovascular: Normal rate, regular rhythm and normal heart sounds.  Exam reveals no gallop.   No murmur heard. Pulmonary/Chest: No respiratory distress.  Decreased breath sounds with bibasilar crackles  Abdominal: Soft. Bowel sounds are normal. He exhibits no mass. There is no tenderness. There is no rebound and no guarding.  Musculoskeletal: He exhibits no edema.  Lymphadenopathy:    He has no cervical adenopathy.          Assessment & Plan:

## 2016-09-03 NOTE — Assessment & Plan Note (Signed)
Persists for a month No evidence that this is related to kidney or liver No pneumonia evident (1 month duration) Does have specific dysphagia Will try PPI (has history of reflux esophagitis) See back GI eval if not improved

## 2016-09-03 NOTE — Assessment & Plan Note (Signed)
Of metformin and sugars acceptable Will decide about restarting after he feels better

## 2016-09-03 NOTE — Assessment & Plan Note (Signed)
BP Readings from Last 3 Encounters:  09/03/16 140/76  08/25/16 (!) 110/58  08/23/16 (!) 135/56   Off amlodipine and losartan Would plan to restart losartan at least at lower dose once symptoms are better

## 2016-09-14 ENCOUNTER — Ambulatory Visit: Payer: Medicare Other | Admitting: Rheumatology

## 2016-09-15 DIAGNOSIS — J8410 Pulmonary fibrosis, unspecified: Secondary | ICD-10-CM | POA: Diagnosis not present

## 2016-09-15 DIAGNOSIS — R0689 Other abnormalities of breathing: Secondary | ICD-10-CM | POA: Diagnosis not present

## 2016-09-15 NOTE — Telephone Encounter (Signed)
Per EE-MR has this letter and it will be signed once MR is back in the office.

## 2016-09-16 ENCOUNTER — Ambulatory Visit: Payer: Medicare Other | Admitting: Podiatry

## 2016-09-16 ENCOUNTER — Ambulatory Visit (INDEPENDENT_AMBULATORY_CARE_PROVIDER_SITE_OTHER): Payer: Medicare Other | Admitting: Internal Medicine

## 2016-09-16 ENCOUNTER — Encounter: Payer: Self-pay | Admitting: Internal Medicine

## 2016-09-16 VITALS — BP 140/88 | HR 68 | Temp 98.0°F | Wt 215.0 lb

## 2016-09-16 DIAGNOSIS — E1140 Type 2 diabetes mellitus with diabetic neuropathy, unspecified: Secondary | ICD-10-CM | POA: Diagnosis not present

## 2016-09-16 DIAGNOSIS — I10 Essential (primary) hypertension: Secondary | ICD-10-CM | POA: Diagnosis not present

## 2016-09-16 DIAGNOSIS — R110 Nausea: Secondary | ICD-10-CM | POA: Diagnosis not present

## 2016-09-16 MED ORDER — METFORMIN HCL 1000 MG PO TABS: 1000.0000 mg | ORAL_TABLET | Freq: Every day | ORAL | 0 refills | Status: DC

## 2016-09-16 MED ORDER — LOSARTAN POTASSIUM 100 MG PO TABS: 50.0000 mg | ORAL_TABLET | Freq: Every day | ORAL | 0 refills | Status: DC

## 2016-09-16 NOTE — Assessment & Plan Note (Addendum)
GFR over 30 again Will restart the metformin at lower dose Ongoing neuropathy--form done for diabetic shoes

## 2016-09-16 NOTE — Assessment & Plan Note (Signed)
Improved with PPI but persists Still with esophageal dysphagia Will ask Dr Henrene Pastor to see him for evaluation

## 2016-09-16 NOTE — Progress Notes (Signed)
Subjective:    Patient ID: [name], male    DOB: 06/10/1931, 83 y.o.   MRN: [id number]  HPI Here for follow up of nausea and BP problems  Still has some nausea but clearly better Able to eat---but it does cause the nausea Some esophageal dysphagia still--will regurgitate food at times Some stomach pain  Gets dizzy when he gets up from sitting BP up some at home--149/76  Sugars high at times ---295 this AM Off the metformin for now  Current Outpatient Prescriptions on File Prior to Visit  Medication Sig Dispense Refill  . aspirin EC 81 MG tablet Take 81 mg by mouth at bedtime.    [name] atorvastatin (LIPITOR) 80 MG tablet Take 1 tablet (80 mg total) by mouth daily at 6 PM. 30 tablet 11  . azaTHIOprine (IMURAN) 50 MG tablet Take 50 mg by mouth 2 (two) times daily.    . Blood Glucose Monitoring Suppl (ONE TOUCH ULTRA 2) w/Device KIT Use to obtain blood sugar daily. Dx Code E11.40 1 each 0  . carvedilol (COREG) 12.5 MG tablet TAKE 1 TABLET BY MOUTH TWICE A DAY WITH MEALS 60 tablet 11  . clopidogrel (PLAVIX) 75 MG tablet Take 1 tablet (75 mg total) by mouth daily with breakfast. 30 tablet 11  . DHA-Vitamin C-Lutein (EYE HEALTH FORMULA PO) Take 1 tablet by mouth daily.     . DULoxetine (CYMBALTA) 30 MG capsule Take 1 capsule (30 mg total) by mouth daily. 30 capsule 5  . furosemide (LASIX) 40 MG tablet TAKE 1 OR 2 TABLETS BY MOUTH DAILY 60 tablet 10  . glucose blood (ONE TOUCH ULTRA TEST) test strip USE TO CHECK BLOOD SUGAR ONCE A DAY Dx Code E11.40 100 each 3  . isosorbide mononitrate (IMDUR) 30 MG 24 hr tablet Take 1 tablet (30 mg total) by mouth daily. 90 tablet 3  . Multiple Vitamin (MULTIVITAMIN WITH MINERALS) TABS tablet Take 1 tablet by mouth daily.    . nitroGLYCERIN (NITROSTAT) 0.4 MG SL tablet DISSOLVE 1 TABLET UNDER THE TONGUE FOR CHEST PAIN. MAY REPEAT EVERY 5MINUTES UP TO 3 DOSES. IF NO RELIEF, CALL 911** 25 tablet 5  . ONETOUCH DELICA LANCETS 09B MISC     . pantoprazole  (PROTONIX) 40 MG tablet Take 1 tablet (40 mg total) by mouth 2 (two) times daily. 60 tablet 3   No current facility-administered medications on file prior to visit.     Allergies  Allergen Reactions  . Metformin And Related Other (See Comments)    Would avoid due to baseline Cr  . Doxazosin Mesylate Other (See Comments)    dizziness  . Methocarbamol Rash    Past Medical History:  Diagnosis Date  . Arthritis    osteoarthritis, s/p R TKR, and digits  . CAD (coronary artery disease)    a. s/p CABG (2001)  b. s/p DES to RCA and cutting POBA to ostial PDA (2013)   c. s/p DES to SVG to OM2 (01/14/14) d. cath: 08/2015 NSTEMI w/ patent LIMA-LAD and 99% stenosis of SVG-OM w/ DES placed. CTO of SVG-RCA and SVG-D1.   [name] Chronic diastolic CHF (congestive heart failure) ([place])    a) 09/13 ECHO- LVEF 35-32%, grade 1 diastolic dysfunction, mild LA dilatation, atrial septal aneurysm, AV mobility restricted, but no sig AS by doppler; b) 09/04/08 ECHO- LVH, ef 60%, mild AS, c. echo 08/2015: EF perserved of 55-60% with inferolateral HK. Mild AS noted.  . Chronic kidney disease, stage III (moderate)   .  Chronic lower back pain   . Colon polyps   . Diverticulosis   . Dyspnea 2009 since July -Sept   05/06/08-CPST-  normal effort, reduced VO2 max 20.5 /65%, reduced at 8.2/ 40%, normal breathing resetvca of 55%, submaximal heart rate response 112/77%, flattened o2 pluse response at peak exercise-12 ml/beat @ 85%, No VQ mismatch abnormalities, All c/w CIRC Limitation  . Enlarged prostate   . Esophageal stricture    a. s/p dilation spring 2010  . GERD (gastroesophageal reflux disease)   . Heart murmur   . Hiatal hernia   . History of PFTs    mixed pattern on spiro. mild restn on lung volumes with near normal DLCO. Pattern can be explained by CABG scar. Fev1 2.2L/73%, ratio 68 (67), TLC 4.7/68%,RV 1.5L/55%,DLCO 79%  . Hyperlipidemia   . Hypertension   . Interstitial lung disease ([name])    NOS  . Overweight  (BMI 25.0-29.9)    BMI 29  . RA (rheumatoid arthritis) ([name])    [name]  . Seropositive rheumatoid arthritis ([place])   . Type II diabetes mellitus ([place])     Past Surgical History:  Procedure Laterality Date  . CARDIAC CATHETERIZATION  08/2004   CP- no MI, Cath- small vessell disease   . CARDIAC CATHETERIZATION  12/31/2011   80% distal LM, 100% native LAD, LCx and RCA, 30% prox SVG-OM, SVG-D1 normal, 99% distal, 80% ostial SVG-RCA distal to graft, LIMA-LAD normal; LVEF mildly decreased with posterior basal AK   . CARDIAC CATHETERIZATION  2009   with patent grafts/notes 12/31/2011  . CARDIAC CATHETERIZATION N/A 08/13/2015   Procedure: Left Heart Cath and Cors/Grafts Angiography;  Surgeon: Sherren Mocha, MD;  Location: [facility];  Service: Cardiovascular;  Laterality: N/A;  . CATARACT EXTRACTION W/ INTRAOCULAR LENS  IMPLANT, BILATERAL Bilateral   . CHOLECYSTECTOMY OPEN  11/2003   [name]  . CORONARY ANGIOPLASTY WITH STENT PLACEMENT  01/03/2012   Successful DES to SVG-RCA and cutting balloon angioplasty ostial  PDA   . CORONARY ANGIOPLASTY WITH STENT PLACEMENT  01/14/2014   "1"  . CORONARY ARTERY BYPASS GRAFT  11/1999   CABG X5  . CORONARY STENT PLACEMENT  02/2012   1 stent and balloon  . ESOPHAGOGASTRODUODENOSCOPY (EGD) WITH ESOPHAGEAL DILATION  2010  . HAND SURGERY    . JOINT REPLACEMENT    . KNEE ARTHROSCOPY Right 2008  . LEFT AND RIGHT HEART CATHETERIZATION WITH CORONARY ANGIOGRAM  12/31/2011   Procedure: LEFT AND RIGHT HEART CATHETERIZATION WITH CORONARY ANGIOGRAM;  Surgeon: Burnell Blanks, MD;  Location: [facility] CATH LAB;  Service: Cardiovascular;;  . LEFT AND RIGHT HEART CATHETERIZATION WITH CORONARY ANGIOGRAM N/A 01/14/2014   Procedure: LEFT AND RIGHT HEART CATHETERIZATION WITH CORONARY ANGIOGRAM;  Surgeon: Peter M Martinique, MD;  Location: [facility] CATH LAB;  Service: Cardiovascular;  Laterality: N/A;  . PERCUTANEOUS CORONARY INTERVENTION-BALLOON ONLY  01/03/2012   Procedure:  PERCUTANEOUS CORONARY INTERVENTION-BALLOON ONLY;  Surgeon: Peter M Martinique, MD;  Location: [facility] CATH LAB;  Service: Cardiovascular;;  . PERCUTANEOUS CORONARY STENT INTERVENTION (PCI-S)  12/31/2011   Procedure: PERCUTANEOUS CORONARY STENT INTERVENTION (PCI-S);  Surgeon: Burnell Blanks, MD;  Location: [facility] CATH LAB;  Service: Cardiovascular;;  . PERCUTANEOUS CORONARY STENT INTERVENTION (PCI-S) N/A 01/03/2012   Procedure: PERCUTANEOUS CORONARY STENT INTERVENTION (PCI-S);  Surgeon: Peter M Martinique, MD;  Location: [facility] CATH LAB;  Service: Cardiovascular;  Laterality: N/A;  . SHOULDER ARTHROSCOPY WITH OPEN ROTATOR CUFF REPAIR AND DISTAL CLAVICLE ACROMINECTOMY Left 02/27/2013   Procedure: LEFT SHOULDER  ARTHROSCOPY WITH MINI OPEN ROTATOR CUFF REPAIR AND SUBACROMIAL DECOMPRESSION AND DISTAL CLAVICLE RESECTION;  Surgeon: [name], MD;  Location: [place];  Service: Orthopedics;  Laterality: Left;  . TOTAL KNEE ARTHROPLASTY Right 03/2010   [name]  . TRIGGER FINGER RELEASE Left 02/27/2013   Procedure: RELEASE TRIGGER FINGER/A-1 PULLEY;  Surgeon: [name], MD;  Location: [facility];  Service: Orthopedics;  Laterality: Left;    Family History  Problem Relation Age of Onset  . COPD Mother   . Heart disease Father   . Heart attack Father   . Diabetes Brother   . Colon cancer Brother 25  . Alcohol abuse Sister   . Stroke Sister     Social History   Social History  . Marital status: Married    Spouse name: N/A  . Number of children: 3  . Years of education: N/A   Occupational History  . Conservation officer, nature Retired    retired   Social History Main Topics  . Smoking status: Former Smoker    Packs/day: 1.00    Years: 20.00    Types: Cigarettes    Quit date: 04/06/1963  . Smokeless tobacco: Never Used  . Alcohol use No     Comment: 01/01/2012 "last alcohol ~ 50 yr ago"  . Drug use: No  . Sexual activity: Not Currently   Other Topics Concern  . Not on file   Social History Narrative   No  living will   Requests wife as health care POA   Discussed DNR --he requests this (done 08/29/12)   Not sure about feeding tube---but might accept for some time   Patient lives with wife and daughter in a one story home.  Has 3 children.  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   Review of Systems Weight is up a few pounds Trouble initiating sleep--then does okay Nocturia x 1-2    Objective:   Physical Exam  Constitutional: He appears well-nourished. No distress.  Neck: No thyromegaly present.  Cardiovascular: Normal rate, regular rhythm and intact distal pulses.  Exam reveals no gallop.   ??very slight systolic murmur  Pulmonary/Chest: No respiratory distress. He has no wheezes.  Crackles in lower half bilaterally  Lymphadenopathy:    He has no cervical adenopathy.  Neurological:  Decreased sensation in feet  Skin:  No foot ulcers          Assessment & Plan:

## 2016-09-16 NOTE — Assessment & Plan Note (Signed)
BP Readings from Last 3 Encounters:  09/16/16 140/88  09/03/16 140/76  08/25/16 (!) 110/58   Some better Will restart just the losartan

## 2016-09-17 MED ORDER — METFORMIN HCL 1000 MG PO TABS: 1000.0000 mg | ORAL_TABLET | Freq: Every day | ORAL | 3 refills | Status: DC

## 2016-09-17 NOTE — Addendum Note (Signed)
Addended by: Lurlean Nanny on: 09/17/2016 05:37 PM   Modules accepted: Orders

## 2016-09-21 ENCOUNTER — Telehealth: Payer: Self-pay

## 2016-09-21 NOTE — Telephone Encounter (Signed)
Pt scheduled to see Tye Savoy NP 10/11/16@1 :30pm. Pt aware of appt.

## 2016-09-21 NOTE — Telephone Encounter (Signed)
-----   Message from Irene Shipper, MD sent at 09/21/2016  1:04 PM EDT ----- Vaughan [facility], Arrange routine GI appointment with me or APP. Thanks  jp  ----- Message ----- From: [name], MD Sent: 09/16/2016   3:51 PM To: Irene Shipper, MD  John,  Can you set him up for an appt. Having nausea and ongoing esophageal dysphagia ----though some better back on PPI  Rich

## 2016-09-24 NOTE — Telephone Encounter (Signed)
[name],   Are you able to fax [place] the rejection letter for the [employer]? Fax: [phone].

## 2016-09-24 NOTE — Telephone Encounter (Signed)
Daneil Dan - The letter has been uploaded under the media tab in patient's chart.  It was uploaded on 08/16/16.  Please let me know if you need me to fax it as well.

## 2016-09-24 NOTE — Telephone Encounter (Signed)
Appeal letter and supporting documents faxed to appeals department. Information is in blue folder. Will await decision.

## 2016-09-30 ENCOUNTER — Ambulatory Visit (INDEPENDENT_AMBULATORY_CARE_PROVIDER_SITE_OTHER): Payer: Medicare Other

## 2016-09-30 ENCOUNTER — Encounter (INDEPENDENT_AMBULATORY_CARE_PROVIDER_SITE_OTHER): Payer: Self-pay | Admitting: Orthopedic Surgery

## 2016-09-30 ENCOUNTER — Ambulatory Visit (INDEPENDENT_AMBULATORY_CARE_PROVIDER_SITE_OTHER): Payer: Medicare Other | Admitting: Orthopedic Surgery

## 2016-09-30 VITALS — BP 115/66 | HR 75 | Resp 14 | Ht 72.0 in | Wt 225.0 lb

## 2016-09-30 DIAGNOSIS — M25562 Pain in left knee: Secondary | ICD-10-CM

## 2016-09-30 DIAGNOSIS — G8929 Other chronic pain: Secondary | ICD-10-CM | POA: Diagnosis not present

## 2016-09-30 NOTE — Progress Notes (Signed)
Office Visit Note   Patient: [name]           Date of Birth: [birth year]           MRN: [id number] Visit Date: 09/30/2016              Requested by: [name], MD [place], [name] [id number] PCP: [name], MD   Assessment & Plan: Visit Diagnoses:  1. Chronic pain of left knee     Plan:  #1: Since she is not having any pain at all plan is to proceed with the conservative measures. #2: Told Him if he has pain which worsens to give [place] a call and we can always put a cortical steroid injection. He is a diabetic and his glucoses are running in the high 200s so I have to be very cautious about that also.  Follow-Up Instructions: Return if symptoms worsen or fail to improve.   Orders:  Orders Placed This Encounter  Procedures  . XR KNEE 3 VIEW LEFT   No orders of the defined types were placed in this encounter.     Procedures: No procedures performed   Clinical Data: No additional findings.   Subjective: Chief Complaint  Patient presents with  . Left Knee - Pain    [name] is an 83 y o that presents with L knee pain and has fallen 4 times. He has seen his PCP as he relates he has been sick for 7 weeks. Dizziness, neuropathy    [name] is a very pleasant 83 year old white male who is seen today for evaluation of left knee pain. His wife states that he is fallen about 4 times. He seen his PCP for related illness over the past 7 weeks. He is complains of dizziness and apparently has some regurgitation and esophageal complaints. He comes in today for a left knee injection however he states that he was going to cancel the appointment because his knee was feeling much better. He chose to come in today anyhow for evaluation.        Review of Systems  Gastrointestinal: Positive for nausea.  Genitourinary:       History of nocturia  All other systems reviewed and are negative.    Objective: Vital Signs: BP 115/66    Pulse 75   Resp 14   Ht 6' (1.829 m)   Wt 225 lb (102.1 kg)   BMI 30.52 kg/m   Physical Exam  Constitutional: He is oriented to person, place, and time. He appears well-developed and well-nourished.  HENT:  Head: Normocephalic and atraumatic.  Eyes: EOM are normal. Pupils are equal, round, and reactive to light.  Pulmonary/Chest: Effort normal.  Neurological: He is alert and oriented to person, place, and time.  Skin: Skin is warm and dry.  Psychiatric: He has a normal mood and affect. His behavior is normal. Judgment and thought content normal.    Ortho Exam  Today he lacks a few degrees of full extension. He flexes to about 105. Not much of an effusion. Tender over the medial joint line. A little bit of crepitus with range of motion.  Specialty Comments:  No specialty comments available.  Imaging: Xr Knee 3 View Left  Result Date: 09/30/2016 Three-view x-ray of the left knee reveals marked medial compartment joint space narrowing. He does have more of a medial tibial plateau spur and some mild spurring of the medial femoral condyle. Also has. Trigger  spurring laterally also. Sclerosing of the medial tibial plateau. Does have some. Articular changes in the patellofemoral joint.    PMFS History: Patient Active Problem List   Diagnosis Date Noted  . Diarrhea 08/18/2016  . Tegretol-induced dizziness 05/14/2016  . Spinal stenosis of lumbar region without neurogenic claudication 03/23/2016  . Spondylosis without myelopathy or radiculopathy, lumbar region 03/23/2016  . Respiratory failure, chronic ([place]) 11/21/2014  . DM (diabetes mellitus) type II controlled, neurological manifestation ([place])   . Atherosclerotic heart disease of native coronary artery with angina pectoris ([place])   . Ventral hernia 12/17/2013  . Seropositive rheumatoid arthritis ([place])   . Routine general medical examination at a health care facility 08/29/2012  . CKD stage 3 due to type 2 diabetes mellitus ([place])    . ILD (interstitial lung disease) ([place]) 11/28/2011  . Chronic diastolic heart failure ([place]) 09/14/2011  . Nausea 02/16/2011  . Obstructive sleep apnea 12/17/2008  . ESOPHAGEAL STRICTURE 10/09/2008  . Reflux esophagitis 09/10/2008  . DIVERTICULOSIS-COLON 09/10/2008  . Aortic sclerosis ([place]) 03/19/2008  . ACTINIC KERATOSIS 10/23/2007  . SLEEP DISORDER, CHRONIC 10/17/2006  . HLD (hyperlipidemia) 09/23/2006  . Essential hypertension 09/23/2006  . GERD 09/23/2006  . BENIGN PROSTATIC HYPERTROPHY 09/23/2006   Past Medical History:  Diagnosis Date  . Arthritis    osteoarthritis, s/p R TKR, and digits  . CAD (coronary artery disease)    a. s/p CABG (2001)  b. s/p DES to RCA and cutting POBA to ostial PDA (2013)   c. s/p DES to SVG to OM2 (01/14/14) d. cath: 08/2015 NSTEMI w/ patent LIMA-LAD and 99% stenosis of SVG-OM w/ DES placed. CTO of SVG-RCA and SVG-D1.   [name] Chronic diastolic CHF (congestive heart failure) ([place])    a) 09/13 ECHO- LVEF 70-01%, grade 1 diastolic dysfunction, mild LA dilatation, atrial septal aneurysm, AV mobility restricted, but no sig AS by doppler; b) 09/04/08 ECHO- LVH, ef 60%, mild AS, c. echo 08/2015: EF perserved of 55-60% with inferolateral HK. Mild AS noted.  . Chronic kidney disease, stage III (moderate)   . Chronic lower back pain   . Colon polyps   . Diverticulosis   . Dyspnea 2009 since July -Sept   05/06/08-CPST-  normal effort, reduced VO2 max 20.5 /65%, reduced at 8.2/ 40%, normal breathing resetvca of 55%, submaximal heart rate response 112/77%, flattened o2 pluse response at peak exercise-12 ml/beat @ 85%, No VQ mismatch abnormalities, All c/w CIRC Limitation  . Enlarged prostate   . Esophageal stricture    a. s/p dilation spring 2010  . GERD (gastroesophageal reflux disease)   . Heart murmur   . Hiatal hernia   . History of PFTs    mixed pattern on spiro. mild restn on lung volumes with near normal DLCO. Pattern can be explained by CABG scar. Fev1  2.2L/73%, ratio 68 (67), TLC 4.7/68%,RV 1.5L/55%,DLCO 79%  . Hyperlipidemia   . Hypertension   . Interstitial lung disease ([name])    NOS  . Overweight (BMI 25.0-29.9)    BMI 29  . RA (rheumatoid arthritis) ([name])    [name]  . Seropositive rheumatoid arthritis ([place])   . Type II diabetes mellitus ([name])     Family History  Problem Relation Age of Onset  . COPD Mother   . Heart disease Father   . Heart attack Father   . Diabetes Brother   . Colon cancer Brother 25  . Alcohol abuse Sister   . Stroke Sister     Past Surgical  History:  Procedure Laterality Date  . CARDIAC CATHETERIZATION  08/2004   CP- no MI, Cath- small vessell disease   . CARDIAC CATHETERIZATION  12/31/2011   80% distal LM, 100% native LAD, LCx and RCA, 30% prox SVG-OM, SVG-D1 normal, 99% distal, 80% ostial SVG-RCA distal to graft, LIMA-LAD normal; LVEF mildly decreased with posterior basal AK   . CARDIAC CATHETERIZATION  2009   with patent grafts/notes 12/31/2011  . CARDIAC CATHETERIZATION N/A 08/13/2015   Procedure: Left Heart Cath and Cors/Grafts Angiography;  Surgeon: Sherren Mocha, MD;  Location: [facility];  Service: Cardiovascular;  Laterality: N/A;  . CATARACT EXTRACTION W/ INTRAOCULAR LENS  IMPLANT, BILATERAL Bilateral   . CHOLECYSTECTOMY OPEN  11/2003   [name]  . CORONARY ANGIOPLASTY WITH STENT PLACEMENT  01/03/2012   Successful DES to SVG-RCA and cutting balloon angioplasty ostial  PDA   . CORONARY ANGIOPLASTY WITH STENT PLACEMENT  01/14/2014   "1"  . CORONARY ARTERY BYPASS GRAFT  11/1999   CABG X5  . CORONARY STENT PLACEMENT  02/2012   1 stent and balloon  . ESOPHAGOGASTRODUODENOSCOPY (EGD) WITH ESOPHAGEAL DILATION  2010  . HAND SURGERY    . JOINT REPLACEMENT    . KNEE ARTHROSCOPY Right 2008  . LEFT AND RIGHT HEART CATHETERIZATION WITH CORONARY ANGIOGRAM  12/31/2011   Procedure: LEFT AND RIGHT HEART CATHETERIZATION WITH CORONARY ANGIOGRAM;  Surgeon: Burnell Blanks, MD;  Location:  [facility] CATH LAB;  Service: Cardiovascular;;  . LEFT AND RIGHT HEART CATHETERIZATION WITH CORONARY ANGIOGRAM N/A 01/14/2014   Procedure: LEFT AND RIGHT HEART CATHETERIZATION WITH CORONARY ANGIOGRAM;  Surgeon: Peter M Martinique, MD;  Location: [facility] CATH LAB;  Service: Cardiovascular;  Laterality: N/A;  . PERCUTANEOUS CORONARY INTERVENTION-BALLOON ONLY  01/03/2012   Procedure: PERCUTANEOUS CORONARY INTERVENTION-BALLOON ONLY;  Surgeon: Peter M Martinique, MD;  Location: [facility] CATH LAB;  Service: Cardiovascular;;  . PERCUTANEOUS CORONARY STENT INTERVENTION (PCI-S)  12/31/2011   Procedure: PERCUTANEOUS CORONARY STENT INTERVENTION (PCI-S);  Surgeon: Burnell Blanks, MD;  Location: [facility] CATH LAB;  Service: Cardiovascular;;  . PERCUTANEOUS CORONARY STENT INTERVENTION (PCI-S) N/A 01/03/2012   Procedure: PERCUTANEOUS CORONARY STENT INTERVENTION (PCI-S);  Surgeon: Peter M Martinique, MD;  Location: [facility] CATH LAB;  Service: Cardiovascular;  Laterality: N/A;  . SHOULDER ARTHROSCOPY WITH OPEN ROTATOR CUFF REPAIR AND DISTAL CLAVICLE ACROMINECTOMY Left 02/27/2013   Procedure: LEFT SHOULDER ARTHROSCOPY WITH MINI OPEN ROTATOR CUFF REPAIR AND SUBACROMIAL DECOMPRESSION AND DISTAL CLAVICLE RESECTION;  Surgeon: [name], MD;  Location: [place];  Service: Orthopedics;  Laterality: Left;  . TOTAL KNEE ARTHROPLASTY Right 03/2010   [name]  . TRIGGER FINGER RELEASE Left 02/27/2013   Procedure: RELEASE TRIGGER FINGER/A-1 PULLEY;  Surgeon: [name], MD;  Location: [place];  Service: Orthopedics;  Laterality: Left;   Social History   Occupational History  . Conservation officer, [employer] Retired    retired   Social History Main Topics  . Smoking status: Former Smoker    Packs/day: 1.00    Years: 20.00    Types: Cigarettes    Quit date: 04/06/1963  . Smokeless tobacco: Never Used  . Alcohol use No     Comment: 01/01/2012 "last alcohol ~ 50 yr ago"  . Drug use: No  . Sexual activity: Not Currently

## 2016-10-01 ENCOUNTER — Ambulatory Visit (INDEPENDENT_AMBULATORY_CARE_PROVIDER_SITE_OTHER): Payer: Medicare Other | Admitting: Internal Medicine

## 2016-10-01 ENCOUNTER — Encounter: Payer: Self-pay | Admitting: Internal Medicine

## 2016-10-01 VITALS — BP 130/84 | HR 76 | Temp 97.4°F | Wt 209.0 lb

## 2016-10-01 DIAGNOSIS — R110 Nausea: Secondary | ICD-10-CM | POA: Diagnosis not present

## 2016-10-01 DIAGNOSIS — I951 Orthostatic hypotension: Secondary | ICD-10-CM | POA: Diagnosis not present

## 2016-10-01 MED ORDER — ONDANSETRON HCL 4 MG PO TABS: 4.0000 mg | ORAL_TABLET | Freq: Three times a day (TID) | ORAL | 0 refills | Status: DC | PRN

## 2016-10-01 MED ORDER — NITROGLYCERIN 0.4 MG SL SUBL: SUBLINGUAL_TABLET | SUBLINGUAL | 5 refills | Status: DC

## 2016-10-01 NOTE — Patient Instructions (Signed)
Stop the losartan completely. If you continue the dizzy spells, let me know (I will decrease the carvedilol dose).

## 2016-10-01 NOTE — Assessment & Plan Note (Signed)
Persists Awaiting GI evaluation

## 2016-10-01 NOTE — Assessment & Plan Note (Signed)
BP 150/70 supine and 128/60 standing HR stayed the same at 66 Didn't have the dizziness or falls when off the losartan--so will stop this again There is a component of chronotropic insufficiency also--since HR did not go up (and could consider cutting the carvedilol)--but since his symptoms were gone off the losartan, will just stop that

## 2016-10-01 NOTE — Progress Notes (Signed)
Subjective:    Patient ID: [name], male    DOB: [birth year], 83 y.o.   MRN: [id number]  HPI Here due to ongoing dizziness Will happen soon after he gets up and starts walking Has fallen 2-3 times Will have his knees and legs start shaking--then falls  Still having nausea Did get appt to see Dr Henrene Pastor soon  Nausea is persistent Gets sick after he eats--- not dysphagia though now  Takes 1 furosemide every day Coreg bid Losartan has restarted  Current Outpatient Prescriptions on File Prior to Visit  Medication Sig Dispense Refill  . aspirin EC 81 MG tablet Take 81 mg by mouth at bedtime.    [name] atorvastatin (LIPITOR) 80 MG tablet Take 1 tablet (80 mg total) by mouth daily at 6 PM. 30 tablet 11  . azaTHIOprine (IMURAN) 50 MG tablet Take 50 mg by mouth 2 (two) times daily.    . Blood Glucose Monitoring Suppl (ONE TOUCH ULTRA 2) w/Device KIT Use to obtain blood sugar daily. Dx Code E11.40 1 each 0  . carvedilol (COREG) 12.5 MG tablet TAKE 1 TABLET BY MOUTH TWICE A DAY WITH MEALS 60 tablet 11  . clopidogrel (PLAVIX) 75 MG tablet Take 1 tablet (75 mg total) by mouth daily with breakfast. 30 tablet 11  . DHA-Vitamin C-Lutein (EYE HEALTH FORMULA PO) Take 1 tablet by mouth daily.     . DULoxetine (CYMBALTA) 30 MG capsule Take 1 capsule (30 mg total) by mouth daily. 30 capsule 5  . furosemide (LASIX) 40 MG tablet TAKE 1 OR 2 TABLETS BY MOUTH DAILY 60 tablet 10  . glucose blood (ONE TOUCH ULTRA TEST) test strip USE TO CHECK BLOOD SUGAR ONCE A DAY Dx Code E11.40 100 each 3  . isosorbide mononitrate (IMDUR) 30 MG 24 hr tablet Take 1 tablet (30 mg total) by mouth daily. 90 tablet 3  . losartan (COZAAR) 100 MG tablet Take 0.5 tablets (50 mg total) by mouth daily. 1 tablet 0  . metFORMIN (GLUCOPHAGE) 1000 MG tablet Take 1 tablet (1,000 mg total) by mouth daily with breakfast. 30 tablet 3  . Multiple Vitamin (MULTIVITAMIN WITH MINERALS) TABS tablet Take 1 tablet by mouth daily.    .  nitroGLYCERIN (NITROSTAT) 0.4 MG SL tablet DISSOLVE 1 TABLET UNDER THE TONGUE FOR CHEST PAIN. MAY REPEAT EVERY 5MINUTES UP TO 3 DOSES. IF NO RELIEF, CALL 911** 25 tablet 5  . ONETOUCH DELICA LANCETS 72Z MISC     . pantoprazole (PROTONIX) 40 MG tablet Take 1 tablet (40 mg total) by mouth 2 (two) times daily. 60 tablet 3   No current facility-administered medications on file prior to visit.     Allergies  Allergen Reactions  . Metformin And Related Other (See Comments)    Would avoid due to baseline Cr  . Doxazosin Mesylate Other (See Comments)    dizziness  . Methocarbamol Rash    Past Medical History:  Diagnosis Date  . Arthritis    osteoarthritis, s/p R TKR, and digits  . CAD (coronary artery disease)    a. s/p CABG (2001)  b. s/p DES to RCA and cutting POBA to ostial PDA (2013)   c. s/p DES to SVG to OM2 (01/14/14) d. cath: 08/2015 NSTEMI w/ patent LIMA-LAD and 99% stenosis of SVG-OM w/ DES placed. CTO of SVG-RCA and SVG-D1.   [name] Chronic diastolic CHF (congestive heart failure) ([place])    a) 09/13 ECHO- LVEF 36-64%, grade 1 diastolic dysfunction, mild LA dilatation, atrial  septal aneurysm, AV mobility restricted, but no sig AS by doppler; b) 09/04/08 ECHO- LVH, ef 60%, mild AS, c. echo 08/2015: EF perserved of 55-60% with inferolateral HK. Mild AS noted.  . Chronic kidney disease, stage III (moderate)   . Chronic lower back pain   . Colon polyps   . Diverticulosis   . Dyspnea 2009 since July -Sept   05/06/08-CPST-  normal effort, reduced VO2 max 20.5 /65%, reduced at 8.2/ 40%, normal breathing resetvca of 55%, submaximal heart rate response 112/77%, flattened o2 pluse response at peak exercise-12 ml/beat @ 85%, No VQ mismatch abnormalities, All c/w CIRC Limitation  . Enlarged prostate   . Esophageal stricture    a. s/p dilation spring 2010  . GERD (gastroesophageal reflux disease)   . Heart murmur   . Hiatal hernia   . History of PFTs    mixed pattern on spiro. mild restn on lung  volumes with near normal DLCO. Pattern can be explained by CABG scar. Fev1 2.2L/73%, ratio 68 (67), TLC 4.7/68%,RV 1.5L/55%,DLCO 79%  . Hyperlipidemia   . Hypertension   . Interstitial lung disease ([name])    NOS  . Overweight (BMI 25.0-29.9)    BMI 29  . RA (rheumatoid arthritis) ([name])    [name]  . Seropositive rheumatoid arthritis ([place])   . Type II diabetes mellitus ([place])     Past Surgical History:  Procedure Laterality Date  . CARDIAC CATHETERIZATION  08/2004   CP- no MI, Cath- small vessell disease   . CARDIAC CATHETERIZATION  12/31/2011   80% distal LM, 100% native LAD, LCx and RCA, 30% prox SVG-OM, SVG-D1 normal, 99% distal, 80% ostial SVG-RCA distal to graft, LIMA-LAD normal; LVEF mildly decreased with posterior basal AK   . CARDIAC CATHETERIZATION  2009   with patent grafts/notes 12/31/2011  . CARDIAC CATHETERIZATION N/A 08/13/2015   Procedure: Left Heart Cath and Cors/Grafts Angiography;  Surgeon: Sherren Mocha, MD;  Location: [place] [facility];  Service: Cardiovascular;  Laterality: N/A;  . CATARACT EXTRACTION W/ INTRAOCULAR LENS  IMPLANT, BILATERAL Bilateral   . CHOLECYSTECTOMY OPEN  11/2003   [name]  . CORONARY ANGIOPLASTY WITH STENT PLACEMENT  01/03/2012   Successful DES to SVG-RCA and cutting balloon angioplasty ostial  PDA   . CORONARY ANGIOPLASTY WITH STENT PLACEMENT  01/14/2014   "1"  . CORONARY ARTERY BYPASS GRAFT  11/1999   CABG X5  . CORONARY STENT PLACEMENT  02/2012   1 stent and balloon  . ESOPHAGOGASTRODUODENOSCOPY (EGD) WITH ESOPHAGEAL DILATION  2010  . HAND SURGERY    . JOINT REPLACEMENT    . KNEE ARTHROSCOPY Right 2008  . LEFT AND RIGHT HEART CATHETERIZATION WITH CORONARY ANGIOGRAM  12/31/2011   Procedure: LEFT AND RIGHT HEART CATHETERIZATION WITH CORONARY ANGIOGRAM;  Surgeon: Burnell Blanks, MD;  Location: [facility] CATH LAB;  Service: Cardiovascular;;  . LEFT AND RIGHT HEART CATHETERIZATION WITH CORONARY ANGIOGRAM N/A 01/14/2014   Procedure: LEFT  AND RIGHT HEART CATHETERIZATION WITH CORONARY ANGIOGRAM;  Surgeon: Peter M Martinique, MD;  Location: [facility] CATH LAB;  Service: Cardiovascular;  Laterality: N/A;  . PERCUTANEOUS CORONARY INTERVENTION-BALLOON ONLY  01/03/2012   Procedure: PERCUTANEOUS CORONARY INTERVENTION-BALLOON ONLY;  Surgeon: Peter M Martinique, MD;  Location: [facility] CATH LAB;  Service: Cardiovascular;;  . PERCUTANEOUS CORONARY STENT INTERVENTION (PCI-S)  12/31/2011   Procedure: PERCUTANEOUS CORONARY STENT INTERVENTION (PCI-S);  Surgeon: Burnell Blanks, MD;  Location: [facility] CATH LAB;  Service: Cardiovascular;;  . PERCUTANEOUS CORONARY STENT INTERVENTION (PCI-S) N/A 01/03/2012  Procedure: PERCUTANEOUS CORONARY STENT INTERVENTION (PCI-S);  Surgeon: Peter M Martinique, MD;  Location: [facility] CATH LAB;  Service: Cardiovascular;  Laterality: N/A;  . SHOULDER ARTHROSCOPY WITH OPEN ROTATOR CUFF REPAIR AND DISTAL CLAVICLE ACROMINECTOMY Left 02/27/2013   Procedure: LEFT SHOULDER ARTHROSCOPY WITH MINI OPEN ROTATOR CUFF REPAIR AND SUBACROMIAL DECOMPRESSION AND DISTAL CLAVICLE RESECTION;  Surgeon: [name], MD;  Location: [place];  Service: Orthopedics;  Laterality: Left;  . TOTAL KNEE ARTHROPLASTY Right 03/2010   [name]  . TRIGGER FINGER RELEASE Left 02/27/2013   Procedure: RELEASE TRIGGER FINGER/A-1 PULLEY;  Surgeon: [name], MD;  Location: [place];  Service: Orthopedics;  Laterality: Left;    Family History  Problem Relation Age of Onset  . COPD Mother   . Heart disease Father   . Heart attack Father   . Diabetes Brother   . Colon cancer Brother 25  . Alcohol abuse Sister   . Stroke Sister     Social History   Social History  . Marital status: Married    Spouse name: N/A  . Number of children: 3  . Years of education: N/A   Occupational History  . Conservation officer, nature Retired    retired   Social History Main Topics  . Smoking status: Former Smoker    Packs/day: 1.00    Years: 20.00    Types: Cigarettes    Quit date: 04/06/1963   . Smokeless tobacco: Never Used  . Alcohol use No     Comment: 01/01/2012 "last alcohol ~ 50 yr ago"  . Drug use: No  . Sexual activity: Not Currently   Other Topics Concern  . Not on file   Social History Narrative   No living will   Requests wife as health care POA   Discussed DNR --he requests this (done 08/29/12)   Not sure about feeding tube---but might accept for some time   Patient lives with wife and daughter in a one story home.  Has 3 children.  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   Review of Systems Not eating as much Weight is down a few pounds    Objective:   Physical Exam  Constitutional: No distress.  Cardiovascular: Normal rate, regular rhythm and normal heart sounds.  Exam reveals no gallop.   No murmur heard. Pulmonary/Chest: Effort normal. No respiratory distress. He has no wheezes.  Bibasilar dry crackles  Psychiatric: He has a normal mood and affect. His behavior is normal.          Assessment & Plan:

## 2016-10-04 ENCOUNTER — Telehealth: Payer: Self-pay

## 2016-10-04 NOTE — Telephone Encounter (Signed)
Received a fax from [facility])  regarding a prior authorization appeal approval for Mycophenolate Mofetil from 08/16/16 to 04/04/17.   Case number:[id number] SG Phone number:[phone] Authorization number: [id number]  Will send document to scan center.  Marielouise Amey, [place], CPhT  4:14 PM

## 2016-10-07 MED ORDER — MYCOPHENOLATE MOFETIL 500 MG PO TABS
ORAL_TABLET | ORAL | 2 refills | Status: DC
Start: 2016-10-07 — End: 2016-11-04

## 2016-10-07 MED ORDER — SULFAMETHOXAZOLE-TRIMETHOPRIM 400-80 MG PO TABS: ORAL_TABLET | ORAL | 2 refills | Status: DC

## 2016-10-07 NOTE — Telephone Encounter (Signed)
Called insurance to check status of appeal, was transferred to [place].  Spoke with rep who advised that Cellcept appeal was overturned and rx has been approved through 04/04/2017.  rx must be sent to [facility] so they can set up shipment with the patient.    Dr. Estanislado Pandy is the provider who initially started pt on this rx- recs to discontinue azathiprine, start CellCept 500 mg daily for one week, then increase to 500 mg BID, and start sulfamethoxazole/trimethoprim 400 mg/80 mg three times weekly.  Counseled patient on the lab monitoring plan of weekly labs for 1 month then every 2 weeks.    MR please advise if this is how you'd like rx to be sent to pharmacy, or if this rx/mgmt needs to come from Dr. Arlean Hopping office.  Thanks!

## 2016-10-07 NOTE — Telephone Encounter (Signed)
Patient advised of recommendation and verbalized understanding.

## 2016-10-07 NOTE — Telephone Encounter (Signed)
He may stop Imuran and start CellCept the next day

## 2016-10-07 NOTE — Telephone Encounter (Signed)
Patient advised Mycophenolate Mofeti was approved by insurance. Patient advised prescription for Cellcept and Bactrim was sent to the pharmacy. Reviewed lab schedule with patient. Patient would like to know how he should go about stopping the Imuran to start the Cellcept.

## 2016-10-07 NOTE — Addendum Note (Signed)
Addended by: Carole Binning on: 10/07/2016 04:06 PM   Modules accepted: Orders

## 2016-10-08 NOTE — Telephone Encounter (Signed)
MR to clarify, although Dr. Estanislado Pandy started pt on Cellcept and Bactrim, you'd like to prescribe and manage these prescriptions?

## 2016-10-08 NOTE — Telephone Encounter (Signed)
LEt us do it cellcept and bactrim as outlined

## 2016-10-11 ENCOUNTER — Ambulatory Visit: Payer: Medicare Other | Admitting: Nurse Practitioner

## 2016-10-12 DIAGNOSIS — D225 Melanocytic nevi of trunk: Secondary | ICD-10-CM | POA: Diagnosis not present

## 2016-10-12 DIAGNOSIS — L570 Actinic keratosis: Secondary | ICD-10-CM | POA: Diagnosis not present

## 2016-10-12 DIAGNOSIS — D1801 Hemangioma of skin and subcutaneous tissue: Secondary | ICD-10-CM | POA: Diagnosis not present

## 2016-10-12 DIAGNOSIS — D485 Neoplasm of uncertain behavior of skin: Secondary | ICD-10-CM | POA: Diagnosis not present

## 2016-10-12 DIAGNOSIS — C4442 Squamous cell carcinoma of skin of scalp and neck: Secondary | ICD-10-CM | POA: Diagnosis not present

## 2016-10-12 DIAGNOSIS — L820 Inflamed seborrheic keratosis: Secondary | ICD-10-CM | POA: Diagnosis not present

## 2016-10-12 DIAGNOSIS — L814 Other melanin hyperpigmentation: Secondary | ICD-10-CM | POA: Diagnosis not present

## 2016-10-12 DIAGNOSIS — L821 Other seborrheic keratosis: Secondary | ICD-10-CM | POA: Diagnosis not present

## 2016-10-14 ENCOUNTER — Encounter: Payer: Self-pay | Admitting: Internal Medicine

## 2016-10-14 ENCOUNTER — Ambulatory Visit (INDEPENDENT_AMBULATORY_CARE_PROVIDER_SITE_OTHER): Payer: Medicare Other | Admitting: Internal Medicine

## 2016-10-14 ENCOUNTER — Telehealth: Payer: Self-pay | Admitting: Radiology

## 2016-10-14 VITALS — BP 140/84 | HR 82 | Temp 97.6°F | Wt 209.0 lb

## 2016-10-14 DIAGNOSIS — I5032 Chronic diastolic (congestive) heart failure: Secondary | ICD-10-CM | POA: Diagnosis not present

## 2016-10-14 DIAGNOSIS — E1140 Type 2 diabetes mellitus with diabetic neuropathy, unspecified: Secondary | ICD-10-CM

## 2016-10-14 DIAGNOSIS — I700 Atherosclerosis of aorta: Secondary | ICD-10-CM | POA: Diagnosis not present

## 2016-10-14 DIAGNOSIS — I951 Orthostatic hypotension: Secondary | ICD-10-CM

## 2016-10-14 DIAGNOSIS — J849 Interstitial pulmonary disease, unspecified: Secondary | ICD-10-CM | POA: Diagnosis not present

## 2016-10-14 LAB — LIPID PANEL
CHOL/HDL RATIO: 4
CHOLESTEROL: 109 mg/dL (ref 0–200)
HDL: 30.2 mg/dL — ABNORMAL LOW (ref >39.00)
LDL CALC: 43 mg/dL (ref 0–99)
NonHDL: 78.83
TRIGLYCERIDES: 177 mg/dL — AB (ref 0.0–149.0)
VLDL: 35.4 mg/dL (ref 0.0–40.0)

## 2016-10-14 LAB — HEMOGLOBIN A1C: Hgb A1c MFr Bld: 8.3 % — ABNORMAL HIGH (ref 4.6–6.5)

## 2016-10-14 LAB — HM DIABETES FOOT EXAM

## 2016-10-14 MED ORDER — CARVEDILOL 12.5 MG PO TABS: 6.2500 mg | ORAL_TABLET | Freq: Two times a day (BID) | ORAL | 0 refills | Status: DC

## 2016-10-14 NOTE — Telephone Encounter (Signed)
Patient states he started the Cellcept on Monday, will come by next week on Tuesday for his labwork To you FYI

## 2016-10-14 NOTE — Assessment & Plan Note (Signed)
Seen on CT scan Already on antiplatelet and statin

## 2016-10-14 NOTE — Telephone Encounter (Signed)
Needs labs this week since he started Cellcept / he will need weekly labs x1 month / will call him

## 2016-10-14 NOTE — Assessment & Plan Note (Signed)
Just started cellcept

## 2016-10-14 NOTE — Patient Instructions (Signed)
Please only take 1/2 tab of carvedilol (coreg) twice a day.

## 2016-10-14 NOTE — Assessment & Plan Note (Signed)
Not checking lately Hopefully still well controlled Will check A1c

## 2016-10-14 NOTE — Assessment & Plan Note (Signed)
Compensated SOB probably mostly pulmonary

## 2016-10-14 NOTE — Progress Notes (Signed)
Subjective:    Patient ID: [name], male    DOB: July 22, 1931, 83 y.o.   MRN: [id number]  HPI Here for follow up of diabetes and other conditions  Still having dizzy spells--mostly upon standing and starting to walk No black outs though No chest pain Breathing is about the same---"still bad"  Gets regular chest CT scans Reviewed report Does not thoracic aortic atherosclerosis In on dual antiplatelet Rx and on statin  Off imuran now Started cellcept ---finally approved by his insurance  Missed GI appt-going tomorrow Nausea persist  Hasn't taken sugar in past couple of weeks Was almost 300 last time he checked Usually okay No hypoglycemic reactions Ongoing sensory loss and constant pain in feet  Current Outpatient Prescriptions on File Prior to Visit  Medication Sig Dispense Refill  . aspirin EC 81 MG tablet Take 81 mg by mouth at bedtime.    [name] atorvastatin (LIPITOR) 80 MG tablet Take 1 tablet (80 mg total) by mouth daily at 6 PM. 30 tablet 11  . Blood Glucose Monitoring Suppl (ONE TOUCH ULTRA 2) w/Device KIT Use to obtain blood sugar daily. Dx Code E11.40 1 each 0  . carvedilol (COREG) 12.5 MG tablet TAKE 1 TABLET BY MOUTH TWICE A DAY WITH MEALS 60 tablet 11  . clopidogrel (PLAVIX) 75 MG tablet Take 1 tablet (75 mg total) by mouth daily with breakfast. 30 tablet 11  . DHA-Vitamin C-Lutein (EYE HEALTH FORMULA PO) Take 1 tablet by mouth daily.     . DULoxetine (CYMBALTA) 30 MG capsule Take 1 capsule (30 mg total) by mouth daily. 30 capsule 5  . furosemide (LASIX) 40 MG tablet TAKE 1 OR 2 TABLETS BY MOUTH DAILY 60 tablet 10  . glucose blood (ONE TOUCH ULTRA TEST) test strip USE TO CHECK BLOOD SUGAR ONCE A DAY Dx Code E11.40 100 each 3  . isosorbide mononitrate (IMDUR) 30 MG 24 hr tablet Take 1 tablet (30 mg total) by mouth daily. 90 tablet 3  . metFORMIN (GLUCOPHAGE) 1000 MG tablet Take 1 tablet (1,000 mg total) by mouth daily with breakfast. 30 tablet 3  . Multiple  Vitamin (MULTIVITAMIN WITH MINERALS) TABS tablet Take 1 tablet by mouth daily.    . mycophenolate (CELLCEPT) 500 MG tablet 500 mg by mouth daily for 1 week and then increase it to 1 tablet by mouth twice a day. 60 tablet 2  . nitroGLYCERIN (NITROSTAT) 0.4 MG SL tablet DISSOLVE 1 TABLET UNDER THE TONGUE FOR CHEST PAIN. MAY REPEAT EVERY 5MINUTES UP TO 3 DOSES. IF NO RELIEF, CALL 911** 25 tablet 5  . ondansetron (ZOFRAN) 4 MG tablet Take 1 tablet (4 mg total) by mouth every 8 (eight) hours as needed for nausea or vomiting. 20 tablet 0  . ONETOUCH DELICA LANCETS 37J MISC     . pantoprazole (PROTONIX) 40 MG tablet Take 1 tablet (40 mg total) by mouth 2 (two) times daily. 60 tablet 3  . sulfamethoxazole-trimethoprim (BACTRIM) 400-80 MG tablet 1 tablet po 3 times a week 12 tablet 2   No current facility-administered medications on file prior to visit.     Allergies  Allergen Reactions  . Metformin And Related Other (See Comments)    Would avoid due to baseline Cr  . Doxazosin Mesylate Other (See Comments)    dizziness  . Methocarbamol Rash    Past Medical History:  Diagnosis Date  . Arthritis    osteoarthritis, s/p R TKR, and digits  . CAD (coronary artery disease)  a. s/p CABG (2001)  b. s/p DES to RCA and cutting POBA to ostial PDA (2013)   c. s/p DES to SVG to OM2 (01/14/14) d. cath: 08/2015 NSTEMI w/ patent LIMA-LAD and 99% stenosis of SVG-OM w/ DES placed. CTO of SVG-RCA and SVG-D1.   [name] Chronic diastolic CHF (congestive heart failure) ([place])    a) 09/13 ECHO- LVEF 62-26%, grade 1 diastolic dysfunction, mild LA dilatation, atrial septal aneurysm, AV mobility restricted, but no sig AS by doppler; b) 09/04/08 ECHO- LVH, ef 60%, mild AS, c. echo 08/2015: EF perserved of 55-60% with inferolateral HK. Mild AS noted.  . Chronic kidney disease, stage III (moderate)   . Chronic lower back pain   . Colon polyps   . Diverticulosis   . Dyspnea 2009 since July -Sept   05/06/08-CPST-  normal effort,  reduced VO2 max 20.5 /65%, reduced at 8.2/ 40%, normal breathing resetvca of 55%, submaximal heart rate response 112/77%, flattened o2 pluse response at peak exercise-12 ml/beat @ 85%, No VQ mismatch abnormalities, All c/w CIRC Limitation  . Enlarged prostate   . Esophageal stricture    a. s/p dilation spring 2010  . GERD (gastroesophageal reflux disease)   . Heart murmur   . Hiatal hernia   . History of PFTs    mixed pattern on spiro. mild restn on lung volumes with near normal DLCO. Pattern can be explained by CABG scar. Fev1 2.2L/73%, ratio 68 (67), TLC 4.7/68%,RV 1.5L/55%,DLCO 79%  . Hyperlipidemia   . Hypertension   . Interstitial lung disease ([name])    NOS  . Overweight (BMI 25.0-29.9)    BMI 29  . RA (rheumatoid arthritis) ([name])    [name]  . Seropositive rheumatoid arthritis ([place])   . Type II diabetes mellitus ([place])     Past Surgical History:  Procedure Laterality Date  . CARDIAC CATHETERIZATION  08/2004   CP- no MI, Cath- small vessell disease   . CARDIAC CATHETERIZATION  12/31/2011   80% distal LM, 100% native LAD, LCx and RCA, 30% prox SVG-OM, SVG-D1 normal, 99% distal, 80% ostial SVG-RCA distal to graft, LIMA-LAD normal; LVEF mildly decreased with posterior basal AK   . CARDIAC CATHETERIZATION  2009   with patent grafts/notes 12/31/2011  . CARDIAC CATHETERIZATION N/A 08/13/2015   Procedure: Left Heart Cath and Cors/Grafts Angiography;  Surgeon: Sherren Mocha, MD;  Location: [place] CV LAB;  Service: Cardiovascular;  Laterality: N/A;  . CATARACT EXTRACTION W/ INTRAOCULAR LENS  IMPLANT, BILATERAL Bilateral   . CHOLECYSTECTOMY OPEN  11/2003   [name]  . CORONARY ANGIOPLASTY WITH STENT PLACEMENT  01/03/2012   Successful DES to SVG-RCA and cutting balloon angioplasty ostial  PDA   . CORONARY ANGIOPLASTY WITH STENT PLACEMENT  01/14/2014   "1"  . CORONARY ARTERY BYPASS GRAFT  11/1999   CABG X5  . CORONARY STENT PLACEMENT  02/2012   1 stent and balloon  .  ESOPHAGOGASTRODUODENOSCOPY (EGD) WITH ESOPHAGEAL DILATION  2010  . HAND SURGERY    . JOINT REPLACEMENT    . KNEE ARTHROSCOPY Right 2008  . LEFT AND RIGHT HEART CATHETERIZATION WITH CORONARY ANGIOGRAM  12/31/2011   Procedure: LEFT AND RIGHT HEART CATHETERIZATION WITH CORONARY ANGIOGRAM;  Surgeon: Burnell Blanks, MD;  Location: [facility] CATH LAB;  Service: Cardiovascular;;  . LEFT AND RIGHT HEART CATHETERIZATION WITH CORONARY ANGIOGRAM N/A 01/14/2014   Procedure: LEFT AND RIGHT HEART CATHETERIZATION WITH CORONARY ANGIOGRAM;  Surgeon: Peter M Martinique, MD;  Location: [facility] CATH LAB;  Service: Cardiovascular;  Laterality: N/A;  . PERCUTANEOUS CORONARY INTERVENTION-BALLOON ONLY  01/03/2012   Procedure: PERCUTANEOUS CORONARY INTERVENTION-BALLOON ONLY;  Surgeon: Peter M Martinique, MD;  Location: [facility] CATH LAB;  Service: Cardiovascular;;  . PERCUTANEOUS CORONARY STENT INTERVENTION (PCI-S)  12/31/2011   Procedure: PERCUTANEOUS CORONARY STENT INTERVENTION (PCI-S);  Surgeon: Burnell Blanks, MD;  Location: [facility] CATH LAB;  Service: Cardiovascular;;  . PERCUTANEOUS CORONARY STENT INTERVENTION (PCI-S) N/A 01/03/2012   Procedure: PERCUTANEOUS CORONARY STENT INTERVENTION (PCI-S);  Surgeon: Peter M Martinique, MD;  Location: [place] CATH LAB;  Service: Cardiovascular;  Laterality: N/A;  . SHOULDER ARTHROSCOPY WITH OPEN ROTATOR CUFF REPAIR AND DISTAL CLAVICLE ACROMINECTOMY Left 02/27/2013   Procedure: LEFT SHOULDER ARTHROSCOPY WITH MINI OPEN ROTATOR CUFF REPAIR AND SUBACROMIAL DECOMPRESSION AND DISTAL CLAVICLE RESECTION;  Surgeon: [name], MD;  Location: [place];  Service: Orthopedics;  Laterality: Left;  . TOTAL KNEE ARTHROPLASTY Right 03/2010   [name]  . TRIGGER FINGER RELEASE Left 02/27/2013   Procedure: RELEASE TRIGGER FINGER/A-1 PULLEY;  Surgeon: [name], MD;  Location: [place];  Service: Orthopedics;  Laterality: Left;    Family History  Problem Relation Age of Onset  . COPD Mother   . Heart disease  Father   . Heart attack Father   . Diabetes Brother   . Colon cancer Brother 25  . Alcohol abuse Sister   . Stroke Sister     Social History   Social History  . Marital status: Married    Spouse name: N/A  . Number of children: 3  . Years of education: N/A   Occupational History  . Conservation officer, nature Retired    retired   Social History Main Topics  . Smoking status: Former Smoker    Packs/day: 1.00    Years: 20.00    Types: Cigarettes    Quit date: 04/06/1963  . Smokeless tobacco: Never Used  . Alcohol use No     Comment: 01/01/2012 "last alcohol ~ 50 yr ago"  . Drug use: No  . Sexual activity: Not Currently   Other Topics Concern  . Not on file   Social History Narrative   No living will   Requests wife as health care POA   Discussed DNR --he requests this (done 08/29/12)   Not sure about feeding tube---but might accept for some time   Patient lives with wife and daughter in a one story home.  Has 3 children.  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   Review of Systems  Appetite is good Weight stable Sleeps okay--occasional trouble initiating     Objective:   Physical Exam  Constitutional: He appears well-developed. No distress.  Neck: No thyromegaly present.  Cardiovascular: Normal rate, regular rhythm and intact distal pulses.  Exam reveals no gallop.   Gr 2/6 coarse systolic murmur (loudest at base but also at apex)  Pulmonary/Chest: Effort normal.  Poor air movement throughout Bibasilar crackles Pursed lip recovery breathing even with putting shoes back on  Lymphadenopathy:    He has no cervical adenopathy.  Neurological:  Decreased sensation in feet  Skin:  No foot lesion  Psychiatric: He has a normal mood and affect. His behavior is normal.          Assessment & Plan:

## 2016-10-14 NOTE — Assessment & Plan Note (Signed)
Ongoing despite the stopping of losartan Will cut the bisoprolol in half

## 2016-10-15 ENCOUNTER — Encounter: Payer: Self-pay | Admitting: Physician Assistant

## 2016-10-15 ENCOUNTER — Ambulatory Visit (INDEPENDENT_AMBULATORY_CARE_PROVIDER_SITE_OTHER): Payer: Medicare Other | Admitting: Physician Assistant

## 2016-10-15 VITALS — BP 140/78 | HR 64 | Ht 73.0 in | Wt 207.8 lb

## 2016-10-15 DIAGNOSIS — R0689 Other abnormalities of breathing: Secondary | ICD-10-CM | POA: Diagnosis not present

## 2016-10-15 DIAGNOSIS — R1310 Dysphagia, unspecified: Secondary | ICD-10-CM

## 2016-10-15 DIAGNOSIS — R110 Nausea: Secondary | ICD-10-CM | POA: Diagnosis not present

## 2016-10-15 DIAGNOSIS — J8410 Pulmonary fibrosis, unspecified: Secondary | ICD-10-CM | POA: Diagnosis not present

## 2016-10-15 NOTE — Progress Notes (Signed)
Initial assessment and plans reviewed 

## 2016-10-15 NOTE — Progress Notes (Signed)
Chief Complaint: Dysphagia  HPI:  [name] is an 83 year old Caucasian male with a past medical history of multiple significant medical problems as listed below including being maintained on Plavix for CAD and chronic diastolic CHF, who was referred to me by [name], MD for a complaint of dysphagia.   Patient has previously followed with [name] and was last seen in clinic 07/16/14. He is known to have a history of GERD and underwent dilation of esophagus June 2010 for peptic stricture. This was helpful after that point. He was seen at that visit for intermittent solid and liquid dysphagia. His PPI had already been changed from Pantoprazole 40 mg daily to Omeprazole 40 mg twice daily. He had a diagnostic esophagram 05/2514 which showed esophageal dysmotility without tertiary contractions. Moderate reflux was present however there was no mucosal or structural abnormality and a 13 mm tablet passed easily through the GE junction. At that visit, the patient had reported that his swallowing had improved.   Today, the patient tells me that he had been doing fairly well since time of last visit to our clinic but now continues with an increasing amount of intermittent dysphagia to both solids and liquids. He does continue on his Pantoprazole 40 mg twice a daily and denies any overt symptoms of heartburn or reflux. He does tell me that anything that eats or drinks can get stuck on it's way down and often he has to regurgitate it. This tends to be worse if he is talking while eating or does not chew well. Patient tells me it is to the point where he would like something checked out.   Also, patient describes a history of nausea for the past 6-9 weeks. Apparently he has been following with his primary care provider regarding this and they have tried to alter various medications to help him with this, but nothing has helped yet. They recently started him on Bactrim 1 tablet by mouth 3 times a week, he is  not really sure why. He also uses Zofran 4 mg when necessary for nausea. Which does help.   Patient denies fever, chills, blood in his stool, melena, change in bowel habits, weight loss, fatigue, anorexia, vomiting, heartburn, reflux or abdominal pain.  Past Medical History:  Diagnosis Date  . Arthritis    osteoarthritis, s/p R TKR, and digits  . CAD (coronary artery disease)    a. s/p CABG (2001)  b. s/p DES to RCA and cutting POBA to ostial PDA (2013)   c. s/p DES to SVG to OM2 (01/14/14) d. cath: 08/2015 NSTEMI w/ patent LIMA-LAD and 99% stenosis of SVG-OM w/ DES placed. CTO of SVG-RCA and SVG-D1.   [name] Chronic diastolic CHF (congestive heart failure) ([place])    a) 09/13 ECHO- LVEF 31-54%, grade 1 diastolic dysfunction, mild LA dilatation, atrial septal aneurysm, AV mobility restricted, but no sig AS by doppler; b) 09/04/08 ECHO- LVH, ef 60%, mild AS, c. echo 08/2015: EF perserved of 55-60% with inferolateral HK. Mild AS noted.  . Chronic kidney disease, stage III (moderate)   . Chronic lower back pain   . Colon polyps   . Diverticulosis   . Dyspnea 2009 since July -Sept   05/06/08-CPST-  normal effort, reduced VO2 max 20.5 /65%, reduced at 8.2/ 40%, normal breathing resetvca of 55%, submaximal heart rate response 112/77%, flattened o2 pluse response at peak exercise-12 ml/beat @ 85%, No VQ mismatch abnormalities, All c/w CIRC Limitation  . Enlarged prostate   .  Esophageal stricture    a. s/p dilation spring 2010  . GERD (gastroesophageal reflux disease)   . Heart murmur   . Hiatal hernia   . History of PFTs    mixed pattern on spiro. mild restn on lung volumes with near normal DLCO. Pattern can be explained by CABG scar. Fev1 2.2L/73%, ratio 68 (67), TLC 4.7/68%,RV 1.5L/55%,DLCO 79%  . Hyperlipidemia   . Hypertension   . Interstitial lung disease ([name])    NOS  . Overweight (BMI 25.0-29.9)    BMI 29  . RA (rheumatoid arthritis) ([name])    [name]  . Seropositive rheumatoid arthritis  ([place])   . Type II diabetes mellitus ([place])     Past Surgical History:  Procedure Laterality Date  . CARDIAC CATHETERIZATION  08/2004   CP- no MI, Cath- small vessell disease   . CARDIAC CATHETERIZATION  12/31/2011   80% distal LM, 100% native LAD, LCx and RCA, 30% prox SVG-OM, SVG-D1 normal, 99% distal, 80% ostial SVG-RCA distal to graft, LIMA-LAD normal; LVEF mildly decreased with posterior basal AK   . CARDIAC CATHETERIZATION  2009   with patent grafts/notes 12/31/2011  . CARDIAC CATHETERIZATION N/A 08/13/2015   Procedure: Left Heart Cath and Cors/Grafts Angiography;  Surgeon: Sherren Mocha, MD;  Location: [facility];  Service: Cardiovascular;  Laterality: N/A;  . CATARACT EXTRACTION W/ INTRAOCULAR LENS  IMPLANT, BILATERAL Bilateral   . CHOLECYSTECTOMY OPEN  11/2003   [name]  . CORONARY ANGIOPLASTY WITH STENT PLACEMENT  01/03/2012   Successful DES to SVG-RCA and cutting balloon angioplasty ostial  PDA   . CORONARY ANGIOPLASTY WITH STENT PLACEMENT  01/14/2014   "1"  . CORONARY ARTERY BYPASS GRAFT  11/1999   CABG X5  . CORONARY STENT PLACEMENT  02/2012   1 stent and balloon  . ESOPHAGOGASTRODUODENOSCOPY (EGD) WITH ESOPHAGEAL DILATION  2010  . HAND SURGERY    . JOINT REPLACEMENT    . KNEE ARTHROSCOPY Right 2008  . LEFT AND RIGHT HEART CATHETERIZATION WITH CORONARY ANGIOGRAM  12/31/2011   Procedure: LEFT AND RIGHT HEART CATHETERIZATION WITH CORONARY ANGIOGRAM;  Surgeon: Burnell Blanks, MD;  Location: [facility] CATH LAB;  Service: Cardiovascular;;  . LEFT AND RIGHT HEART CATHETERIZATION WITH CORONARY ANGIOGRAM N/A 01/14/2014   Procedure: LEFT AND RIGHT HEART CATHETERIZATION WITH CORONARY ANGIOGRAM;  Surgeon: Peter M Martinique, MD;  Location: [facility] CATH LAB;  Service: Cardiovascular;  Laterality: N/A;  . PERCUTANEOUS CORONARY INTERVENTION-BALLOON ONLY  01/03/2012   Procedure: PERCUTANEOUS CORONARY INTERVENTION-BALLOON ONLY;  Surgeon: Peter M Martinique, MD;  Location: [facility] CATH LAB;  Service:  Cardiovascular;;  . PERCUTANEOUS CORONARY STENT INTERVENTION (PCI-S)  12/31/2011   Procedure: PERCUTANEOUS CORONARY STENT INTERVENTION (PCI-S);  Surgeon: Burnell Blanks, MD;  Location: [facility] CATH LAB;  Service: Cardiovascular;;  . PERCUTANEOUS CORONARY STENT INTERVENTION (PCI-S) N/A 01/03/2012   Procedure: PERCUTANEOUS CORONARY STENT INTERVENTION (PCI-S);  Surgeon: Peter M Martinique, MD;  Location: [facility] CATH LAB;  Service: Cardiovascular;  Laterality: N/A;  . SHOULDER ARTHROSCOPY WITH OPEN ROTATOR CUFF REPAIR AND DISTAL CLAVICLE ACROMINECTOMY Left 02/27/2013   Procedure: LEFT SHOULDER ARTHROSCOPY WITH MINI OPEN ROTATOR CUFF REPAIR AND SUBACROMIAL DECOMPRESSION AND DISTAL CLAVICLE RESECTION;  Surgeon: [name], MD;  Location: [place] [facility];  Service: Orthopedics;  Laterality: Left;  . TOTAL KNEE ARTHROPLASTY Right 03/2010   [name]  . TRIGGER FINGER RELEASE Left 02/27/2013   Procedure: RELEASE TRIGGER FINGER/A-1 PULLEY;  Surgeon: [name], MD;  Location: [place];  Service: Orthopedics;  Laterality: Left;  Current Outpatient Prescriptions  Medication Sig Dispense Refill  . aspirin EC 81 MG tablet Take 81 mg by mouth at bedtime.    [name] atorvastatin (LIPITOR) 80 MG tablet Take 1 tablet (80 mg total) by mouth daily at 6 PM. 30 tablet 11  . Blood Glucose Monitoring Suppl (ONE TOUCH ULTRA 2) w/Device KIT Use to obtain blood sugar daily. Dx Code E11.40 1 each 0  . carvedilol (COREG) 12.5 MG tablet Take 0.5 tablets (6.25 mg total) by mouth 2 (two) times daily with a meal. 1 tablet 0  . clopidogrel (PLAVIX) 75 MG tablet Take 1 tablet (75 mg total) by mouth daily with breakfast. 30 tablet 11  . DHA-Vitamin C-Lutein (EYE HEALTH FORMULA PO) Take 1 tablet by mouth daily.     . DULoxetine (CYMBALTA) 30 MG capsule Take 1 capsule (30 mg total) by mouth daily. 30 capsule 5  . furosemide (LASIX) 40 MG tablet TAKE 1 OR 2 TABLETS BY MOUTH DAILY 60 tablet 10  . glucose blood (ONE TOUCH ULTRA TEST) test  strip USE TO CHECK BLOOD SUGAR ONCE A DAY Dx Code E11.40 100 each 3  . metFORMIN (GLUCOPHAGE) 1000 MG tablet Take 1 tablet (1,000 mg total) by mouth daily with breakfast. 30 tablet 3  . Multiple Vitamin (MULTIVITAMIN WITH MINERALS) TABS tablet Take 1 tablet by mouth daily.    . mycophenolate (CELLCEPT) 500 MG tablet 500 mg by mouth daily for 1 week and then increase it to 1 tablet by mouth twice a day. 60 tablet 2  . nitroGLYCERIN (NITROSTAT) 0.4 MG SL tablet DISSOLVE 1 TABLET UNDER THE TONGUE FOR CHEST PAIN. MAY REPEAT EVERY 5MINUTES UP TO 3 DOSES. IF NO RELIEF, CALL 911** 25 tablet 5  . ondansetron (ZOFRAN) 4 MG tablet Take 1 tablet (4 mg total) by mouth every 8 (eight) hours as needed for nausea or vomiting. 20 tablet 0  . ONETOUCH DELICA LANCETS 90W MISC     . pantoprazole (PROTONIX) 40 MG tablet Take 1 tablet (40 mg total) by mouth 2 (two) times daily. 60 tablet 3  . sulfamethoxazole-trimethoprim (BACTRIM) 400-80 MG tablet 1 tablet po 3 times a week 12 tablet 2   No current facility-administered medications for this visit.     Allergies as of 10/15/2016 - Review Complete 10/15/2016  Allergen Reaction Noted  . Doxazosin mesylate Other (See Comments) 09/23/2006  . Methocarbamol Rash     Family History  Problem Relation Age of Onset  . COPD Mother   . Heart disease Father   . Heart attack Father   . Diabetes Brother   . Colon cancer Brother 25  . Alcohol abuse Sister   . Stroke Sister     Social History   Social History  . Marital status: Married    Spouse name: N/A  . Number of children: 3  . Years of education: N/A   Occupational History  . Conservation officer, nature Retired    retired   Social History Main Topics  . Smoking status: Former Smoker    Packs/day: 1.00    Years: 20.00    Types: Cigarettes    Quit date: 04/06/1963  . Smokeless tobacco: Never Used  . Alcohol use No     Comment: 01/01/2012 "last alcohol ~ 50 yr ago"  . Drug use: No  . Sexual activity: Not Currently    Other Topics Concern  . Not on file   Social History Narrative   No living will   Requests wife as health  care POA   Discussed DNR --he requests this (done 08/29/12)   Not sure about feeding tube---but might accept for some time   Patient lives with wife and daughter in a one story home.  Has 3 children.  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    Review of Systems:    Constitutional: No weight loss, fever or chills Skin: No rash  Cardiovascular: No chest pain Respiratory: No SOB Gastrointestinal: See HPI and otherwise negative   Physical Exam:  Vital signs: BP 140/78   Pulse 64   Ht _0  (1.854 m)   Wt 207 lb 12.8 oz (94.3 kg)   BMI 27.42 kg/m   Constitutional:   Pleasant elderly Caucasian male appears to be in NAD, Well developed, Well nourished, alert and cooperative Respiratory: Respirations even and unlabored. Lungs clear to auscultation bilaterally.   No wheezes, crackles, or rhonchi.  Cardiovascular: Normal S1, S2. No MRG. Regular rate and rhythm. No peripheral edema, cyanosis or pallor.  Gastrointestinal:  Soft, nondistended, nontender. No rebound or guarding. Normal bowel sounds. No appreciable masses or hepatomegaly. Psychiatric:  Demonstrates good judgement and reason without abnormal affect or behaviors.  MOST RECENT LABS AND IMAGING: CBC    Component Value Date/Time   WBC 8.3 08/17/2016 1134   RBC 3.85 (L) 08/17/2016 1134   HGB 9.4 (L) 08/17/2016 1134   HGB 10.0 (L) 05/11/2016 1233   HCT 28.7 (L) 08/17/2016 1134   HCT 29.4 (L) 05/11/2016 1233   PLT 317.0 08/17/2016 1134   PLT 249 05/11/2016 1233   MCV 74.4 (L) 08/17/2016 1134   MCV 81 05/11/2016 1233   MCH 24.4 (L) 08/10/2016 1458   MCHC 32.7 08/17/2016 1134   RDW 18.2 (H) 08/17/2016 1134   RDW 16.3 (H) 05/11/2016 1233   LYMPHSABS 1.5 08/17/2016 1134   LYMPHSABS 1.7 05/11/2016 1233   MONOABS 0.7 08/17/2016 1134   EOSABS 0.2 08/17/2016 1134   EOSABS 0.4 05/11/2016 1233    BASOSABS 0.1 08/17/2016 1134   BASOSABS 0.1 05/11/2016 1233    CMP     Component Value Date/Time   NA 137 08/31/2016 1100   NA 140 05/11/2016 1233   K 4.3 08/31/2016 1100   CL 99 08/31/2016 1100   CO2 30 08/31/2016 1100   GLUCOSE 160 (H) 08/31/2016 1100   BUN 24 (H) 08/31/2016 1100   BUN 36 (H) 05/11/2016 1233   CREATININE 2.00 (H) 08/31/2016 1100   CREATININE 2.29 (H) 08/03/2016 0927   CALCIUM 9.7 08/31/2016 1100   PROT 8.0 08/25/2016 1548   PROT 7.4 05/11/2016 1233   ALBUMIN 4.1 08/25/2016 1548   ALBUMIN 3.9 05/11/2016 1233   AST 17 08/25/2016 1548   ALT 9 08/25/2016 1548   ALKPHOS 62 08/25/2016 1548   BILITOT 0.6 08/25/2016 1548   BILITOT 0.3 05/11/2016 1233   GFRNONAA 26 (L) 08/03/2016 0927   GFRAA 30 (L) 08/03/2016 0927    Assessment: 1. Dysphagia: History of peptic stricture as well as esophageal dysmotility, time of last esophagram in 2016 patient did not have signs of structural abnormality and EGD was not pursued, since that time patient has had an increase in symptoms; consider peptic stricture versus worsening dysmotility versus other 2. Chronic anticoagulation: With Plavix 3. Nausea: unclear history, now on abx? Has tried altering meds with no help-may want to pursue EGD in the future for this if not better  Plan: 1. Ordered an esophagram with tablet for further evaluation. If this shows structural component,  recommend an EGD with dilation. This should be scheduled with [name] in the [facility]. Patient would need to be off of his Plavix for 5 days prior to this procedure. We would need to communicate with his cardiologist to ensure that this is acceptable. 2. Reviewed anti-dysphagia measures including taking small bites, drinking sips of water between bites, avoiding distraction while eating and the chin tuck technique. 3. Patient to continue Pantoprazole 40 mg twice daily 4. Patient to follow in clinic per recommendations after review of upcoming  esophagram.  Ellouise Newer, PA-C [facility] 10/15/2016, 1:31 PM  Cc: [name], MD

## 2016-10-15 NOTE — Patient Instructions (Signed)
Continue Pantoprazole sodium 40 mg twice daily.   You have been scheduled for a Barium Esophogram at [facility] on Friday 10-22-2016 at 9:00 am. Please arrive at 9:39 am.  Make certain not to have anything to eat or drink 6 hours prior to your test. If you need to reschedule for any reason, please contact radiology at [phone] to do so. __________________________________________________________________ A barium swallow is an examination that concentrates on views of the esophagus. This tends to be a double contrast exam (barium and two liquids which, when combined, create a gas to distend the wall of the oesophagus) or single contrast (non-ionic iodine based). The study is usually tailored to your symptoms so a good history is essential. Attention is paid during the study to the form, structure and configuration of the esophagus, looking for functional disorders (such as aspiration, dysphagia, achalasia, motility and reflux) EXAMINATION You may be asked to change into a gown, depending on the type of swallow being performed. A radiologist and radiographer will perform the procedure. The radiologist will advise you of the type of contrast selected for your procedure and direct you during the exam. You will be asked to stand, sit or lie in several different positions and to hold a small amount of fluid in your mouth before being asked to swallow while the imaging is performed .In some instances you may be asked to swallow barium coated marshmallows to assess the motility of a solid food bolus. The exam can be recorded as a digital or video fluoroscopy procedure. POST PROCEDURE It will take 1-2 days for the barium to pass through your system. To facilitate this, it is important, unless otherwise directed, to increase your fluids for the next 24-48hrs and to resume your normal diet.  This test typically takes about 30 minutes to  perform. __________________________________________________________________________________

## 2016-10-19 ENCOUNTER — Other Ambulatory Visit: Payer: Self-pay | Admitting: *Deleted

## 2016-10-19 ENCOUNTER — Other Ambulatory Visit: Payer: Self-pay

## 2016-10-19 DIAGNOSIS — Z79899 Other long term (current) drug therapy: Secondary | ICD-10-CM

## 2016-10-19 LAB — CBC WITH DIFFERENTIAL/PLATELET
BASOS ABS: 85 {cells}/uL (ref 0–200)
Basophils Relative: 1 %
EOS PCT: 5 %
Eosinophils Absolute: 425 cells/uL (ref 15–500)
HEMATOCRIT: 30.9 % — AB (ref 38.5–50.0)
HEMOGLOBIN: 9.7 g/dL — AB (ref 13.2–17.1)
LYMPHS ABS: 1870 {cells}/uL (ref 850–3900)
Lymphocytes Relative: 22 %
MCH: 22.9 pg — AB (ref 27.0–33.0)
MCHC: 31.4 g/dL — AB (ref 32.0–36.0)
MCV: 73 fL — ABNORMAL LOW (ref 80.0–100.0)
MPV: 9.3 fL (ref 7.5–12.5)
Monocytes Absolute: 680 cells/uL (ref 200–950)
Monocytes Relative: 8 %
NEUTROS PCT: 64 %
Neutro Abs: 5440 cells/uL (ref 1500–7800)
Platelets: 337 10*3/uL (ref 140–400)
RBC: 4.23 MIL/uL (ref 4.20–5.80)
RDW: 18 % — ABNORMAL HIGH (ref 11.0–15.0)
WBC: 8.5 10*3/uL (ref 3.8–10.8)

## 2016-10-19 LAB — COMPLETE METABOLIC PANEL WITH GFR
ALBUMIN: 3.7 g/dL (ref 3.6–5.1)
ALK PHOS: 71 U/L (ref 40–115)
ALT: 9 U/L (ref 9–46)
AST: 17 U/L (ref 10–35)
BUN: 23 mg/dL (ref 7–25)
CALCIUM: 9.2 mg/dL (ref 8.6–10.3)
CO2: 23 mmol/L (ref 20–31)
Chloride: 98 mmol/L (ref 98–110)
Creat: 2.32 mg/dL — ABNORMAL HIGH (ref 0.70–1.11)
GFR, EST AFRICAN AMERICAN: 29 mL/min — AB (ref >=60)
GFR, EST NON AFRICAN AMERICAN: 25 mL/min — AB (ref >=60)
Glucose, Bld: 174 mg/dL — ABNORMAL HIGH (ref 65–99)
POTASSIUM: 4.5 mmol/L (ref 3.5–5.3)
Sodium: 134 mmol/L — ABNORMAL LOW (ref 135–146)
Total Bilirubin: 0.6 mg/dL (ref 0.2–1.2)
Total Protein: 7.3 g/dL (ref 6.1–8.1)

## 2016-10-20 NOTE — Progress Notes (Signed)
Labs are stable.

## 2016-10-21 DIAGNOSIS — E119 Type 2 diabetes mellitus without complications: Secondary | ICD-10-CM | POA: Diagnosis not present

## 2016-10-21 DIAGNOSIS — Z79899 Other long term (current) drug therapy: Secondary | ICD-10-CM | POA: Insufficient documentation

## 2016-10-21 DIAGNOSIS — H353213 Exudative age-related macular degeneration, right eye, with inactive scar: Secondary | ICD-10-CM | POA: Diagnosis not present

## 2016-10-21 DIAGNOSIS — Z961 Presence of intraocular lens: Secondary | ICD-10-CM | POA: Diagnosis not present

## 2016-10-21 DIAGNOSIS — Z96651 Presence of right artificial knee joint: Secondary | ICD-10-CM | POA: Insufficient documentation

## 2016-10-21 DIAGNOSIS — M170 Bilateral primary osteoarthritis of knee: Secondary | ICD-10-CM | POA: Insufficient documentation

## 2016-10-21 DIAGNOSIS — H353121 Nonexudative age-related macular degeneration, left eye, early dry stage: Secondary | ICD-10-CM | POA: Diagnosis not present

## 2016-10-21 DIAGNOSIS — H524 Presbyopia: Secondary | ICD-10-CM | POA: Diagnosis not present

## 2016-10-21 LAB — HM DIABETES EYE EXAM

## 2016-10-21 NOTE — Progress Notes (Signed)
Office Visit Note  Patient: [name]             Date of Birth: [birth year]           MRN: [id number]             PCP: [name], MD Referring: [name], MD Visit Date: 10/27/2016 Occupation: @GUAROCC @    Subjective:  Shortness of breath.   History of Present Illness: [name] is a 83 y.o. male with history of sero positive rheumatoid arthritis and interstitial lung disease. He was started on CellCept 500 mg by mouth twice a day per Dr. Golden Pop recommendations. He's been also taking Bactrim 3 times a day as prophylactic antibiotic. He's been doing quite well as regards to his joint without any pain or swelling. He does have some stiffness in his joints due to underlying osteoarthritis. He states she's been tolerating CellCept without any problems. He has not noticed any improvement in his breathing so far. His lower back pain is tolerable currently.  Activities of Daily Living:  Patient reports morning stiffness for minutes.   Patient Reports nocturnal pain.  Difficulty dressing/grooming: Denies Difficulty climbing stairs: Reports Difficulty getting out of chair: Reports Difficulty using hands for taps, buttons, cutlery, and/or writing: Reports   Review of Systems  Constitutional: Positive for fatigue. Negative for night sweats and weakness ( ).  HENT: Negative for mouth sores, mouth dryness and nose dryness.   Eyes: Negative for redness and dryness.  Respiratory: Positive for shortness of breath. Negative for difficulty breathing.        ILD  Cardiovascular: Negative for chest pain, palpitations, hypertension, irregular heartbeat and swelling in legs/feet.  Gastrointestinal: Negative for constipation and diarrhea.  Endocrine: Negative for increased urination.  Musculoskeletal: Positive for arthralgias, joint pain and morning stiffness. Negative for joint swelling, myalgias, muscle weakness, muscle tenderness and myalgias.  Skin: Negative for  color change, rash, hair loss, nodules/bumps, skin tightness, ulcers and sensitivity to sunlight.  Allergic/Immunologic: Negative for susceptible to infections.  Neurological: Negative for dizziness, fainting, memory loss and night sweats.  Hematological: Negative for swollen glands.  Psychiatric/Behavioral: Negative for depressed mood and sleep disturbance. The patient is not nervous/anxious.     PMFS History:  Patient Active Problem List   Diagnosis Date Noted  . High risk medication use 10/21/2016  . Primary osteoarthritis of both knees 10/21/2016  . History of right knee joint replacement 10/21/2016  . Orthostatic hypotension 10/01/2016  . Diarrhea 08/18/2016  . Tegretol-induced dizziness 05/14/2016  . Spinal stenosis of lumbar region without neurogenic claudication 03/23/2016  . Spondylosis without myelopathy or radiculopathy, lumbar region 03/23/2016  . Respiratory failure, chronic ([place]) 11/21/2014  . DM (diabetes mellitus) type II controlled, neurological manifestation ([place])   . Atherosclerotic heart disease of native coronary artery with angina pectoris ([place])   . Ventral hernia 12/17/2013  . Seropositive rheumatoid arthritis ([place])   . Routine general medical examination at a health care facility 08/29/2012  . CKD stage 3 due to type 2 diabetes mellitus ([place])   . ILD (interstitial lung disease) ([place]) 11/28/2011  . Chronic diastolic heart failure ([place]) 09/14/2011  . Nausea 02/16/2011  . Obstructive sleep apnea 12/17/2008  . ESOPHAGEAL STRICTURE 10/09/2008  . Reflux esophagitis 09/10/2008  . DIVERTICULOSIS-COLON 09/10/2008  . Thoracic aorta atherosclerosis ([place]) 03/19/2008  . ACTINIC KERATOSIS 10/23/2007  . SLEEP DISORDER, CHRONIC 10/17/2006  . HLD (hyperlipidemia) 09/23/2006  . Essential hypertension 09/23/2006  . GERD 09/23/2006  .  BENIGN PROSTATIC HYPERTROPHY 09/23/2006    Past Medical History:  Diagnosis Date  . Arthritis    osteoarthritis, s/p R TKR, and digits    . CAD (coronary artery disease)    a. s/p CABG (2001)  b. s/p DES to RCA and cutting POBA to ostial PDA (2013)   c. s/p DES to SVG to OM2 (01/14/14) d. cath: 08/2015 NSTEMI w/ patent LIMA-LAD and 99% stenosis of SVG-OM w/ DES placed. CTO of SVG-RCA and SVG-D1.   [name] Chronic diastolic CHF (congestive heart failure) ([place])    a) 09/13 ECHO- LVEF 46-27%, grade 1 diastolic dysfunction, mild LA dilatation, atrial septal aneurysm, AV mobility restricted, but no sig AS by doppler; b) 09/04/08 ECHO- LVH, ef 60%, mild AS, c. echo 08/2015: EF perserved of 55-60% with inferolateral HK. Mild AS noted.  . Chronic kidney disease, stage III (moderate)   . Chronic lower back pain   . Colon polyps   . Diverticulosis   . Dyspnea 2009 since July -Sept   05/06/08-CPST-  normal effort, reduced VO2 max 20.5 /65%, reduced at 8.2/ 40%, normal breathing resetvca of 55%, submaximal heart rate response 112/77%, flattened o2 pluse response at peak exercise-12 ml/beat @ 85%, No VQ mismatch abnormalities, All c/w CIRC Limitation  . Enlarged prostate   . Esophageal stricture    a. s/p dilation spring 2010  . GERD (gastroesophageal reflux disease)   . Heart murmur   . Hiatal hernia   . History of PFTs    mixed pattern on spiro. mild restn on lung volumes with near normal DLCO. Pattern can be explained by CABG scar. Fev1 2.2L/73%, ratio 68 (67), TLC 4.7/68%,RV 1.5L/55%,DLCO 79%  . Hyperlipidemia   . Hypertension   . Interstitial lung disease ([name])    NOS  . Overweight (BMI 25.0-29.9)    BMI 29  . RA (rheumatoid arthritis) ([name])    [name]  . Seropositive rheumatoid arthritis ([place])   . Type II diabetes mellitus ([name])     Family History  Problem Relation Age of Onset  . COPD Mother   . Heart disease Father   . Heart attack Father   . Diabetes Brother   . Colon cancer Brother 25  . Alcohol abuse Sister   . Stroke Sister    Past Surgical History:  Procedure Laterality Date  . CARDIAC CATHETERIZATION  08/2004    CP- no MI, Cath- small vessell disease   . CARDIAC CATHETERIZATION  12/31/2011   80% distal LM, 100% native LAD, LCx and RCA, 30% prox SVG-OM, SVG-D1 normal, 99% distal, 80% ostial SVG-RCA distal to graft, LIMA-LAD normal; LVEF mildly decreased with posterior basal AK   . CARDIAC CATHETERIZATION  2009   with patent grafts/notes 12/31/2011  . CARDIAC CATHETERIZATION N/A 08/13/2015   Procedure: Left Heart Cath and Cors/Grafts Angiography;  Surgeon: Sherren Mocha, MD;  Location: [facility];  Service: Cardiovascular;  Laterality: N/A;  . CATARACT EXTRACTION W/ INTRAOCULAR LENS  IMPLANT, BILATERAL Bilateral   . CHOLECYSTECTOMY OPEN  11/2003   [name]  . CORONARY ANGIOPLASTY WITH STENT PLACEMENT  01/03/2012   Successful DES to SVG-RCA and cutting balloon angioplasty ostial  PDA   . CORONARY ANGIOPLASTY WITH STENT PLACEMENT  01/14/2014   "1"  . CORONARY ARTERY BYPASS GRAFT  11/1999   CABG X5  . CORONARY STENT PLACEMENT  02/2012   1 stent and balloon  . ESOPHAGOGASTRODUODENOSCOPY (EGD) WITH ESOPHAGEAL DILATION  2010  . HAND SURGERY    . JOINT  REPLACEMENT    . KNEE ARTHROSCOPY Right 2008  . LEFT AND RIGHT HEART CATHETERIZATION WITH CORONARY ANGIOGRAM  12/31/2011   Procedure: LEFT AND RIGHT HEART CATHETERIZATION WITH CORONARY ANGIOGRAM;  Surgeon: Burnell Blanks, MD;  Location: [facility] CATH LAB;  Service: Cardiovascular;;  . LEFT AND RIGHT HEART CATHETERIZATION WITH CORONARY ANGIOGRAM N/A 01/14/2014   Procedure: LEFT AND RIGHT HEART CATHETERIZATION WITH CORONARY ANGIOGRAM;  Surgeon: Peter M Martinique, MD;  Location: [facility] CATH LAB;  Service: Cardiovascular;  Laterality: N/A;  . PERCUTANEOUS CORONARY INTERVENTION-BALLOON ONLY  01/03/2012   Procedure: PERCUTANEOUS CORONARY INTERVENTION-BALLOON ONLY;  Surgeon: Peter M Martinique, MD;  Location: [facility] CATH LAB;  Service: Cardiovascular;;  . PERCUTANEOUS CORONARY STENT INTERVENTION (PCI-S)  12/31/2011   Procedure: PERCUTANEOUS CORONARY STENT INTERVENTION (PCI-S);   Surgeon: Burnell Blanks, MD;  Location: [facility] CATH LAB;  Service: Cardiovascular;;  . PERCUTANEOUS CORONARY STENT INTERVENTION (PCI-S) N/A 01/03/2012   Procedure: PERCUTANEOUS CORONARY STENT INTERVENTION (PCI-S);  Surgeon: Peter M Martinique, MD;  Location: [facility] CATH LAB;  Service: Cardiovascular;  Laterality: N/A;  . SHOULDER ARTHROSCOPY WITH OPEN ROTATOR CUFF REPAIR AND DISTAL CLAVICLE ACROMINECTOMY Left 02/27/2013   Procedure: LEFT SHOULDER ARTHROSCOPY WITH MINI OPEN ROTATOR CUFF REPAIR AND SUBACROMIAL DECOMPRESSION AND DISTAL CLAVICLE RESECTION;  Surgeon: [name], MD;  Location: [facility];  Service: Orthopedics;  Laterality: Left;  . TOTAL KNEE ARTHROPLASTY Right 03/2010   [name]  . TRIGGER FINGER RELEASE Left 02/27/2013   Procedure: RELEASE TRIGGER FINGER/A-1 PULLEY;  Surgeon: [name], MD;  Location: [facility];  Service: Orthopedics;  Laterality: Left;   Social History   Social History Narrative   No living will   Requests wife as health care POA   Discussed DNR --he requests this (done 08/29/12)   Not sure about feeding tube---but might accept for some time   Patient lives with wife and daughter in a one story home.  Has 3 children.  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     Objective: Vital Signs: BP (!) 172/90 (BP Location: Left Arm, Patient Position: Sitting, Cuff Size: Normal)   Pulse 83   Ht 6\' 1"  (1.854 m)   Wt 205 lb (93 kg)   BMI 27.05 kg/m    Physical Exam  Constitutional: He is oriented to person, place, and time. He appears well-developed and well-nourished.  HENT:  Head: Normocephalic and atraumatic.  Eyes: Pupils are equal, round, and reactive to light. Conjunctivae and EOM are normal.  Neck: Normal range of motion. Neck supple.  Cardiovascular: Normal rate, regular rhythm and normal heart sounds.   Pulmonary/Chest: Effort normal. He has rales.  Bilateral lung bases  Abdominal: Soft. Bowel sounds are normal.  Neurological: He  is alert and oriented to person, place, and time.  Skin: Skin is warm and dry. Capillary refill takes less than 2 seconds.  Psychiatric: He has a normal mood and affect. His behavior is normal.  Nursing note and vitals reviewed.    Musculoskeletal Exam: C-spine and thoracic lumbar spine good range of motion. Shoulder joints elbow joints wrist joint MCPs PIPs DIPs with good range of motion. Hip joints knee joints ankles MTPs PIPs DIPs are good range of motion with no synovitis.  CDAI Exam: CDAI Homunculus Exam:   Joint Counts:  CDAI Tender Joint count: 0 CDAI Swollen Joint count: 0  Global Assessments:  Patient Global Assessment: 2 Provider Global Assessment: 2  CDAI Calculated Score: 4    Investigation: No additional findings. CBC  Latest Ref Rng & Units 10/26/2016 10/19/2016 08/17/2016  WBC 3.8 - 10.8 K/uL 8.1 8.5 8.3  Hemoglobin 13.2 - 17.1 g/dL 10.0(L) 9.7(L) 9.4(L)  Hematocrit 38.5 - 50.0 % 32.2(L) 30.9(L) 28.7(L)  Platelets 140 - 400 K/uL 339 337 317.0   CMP Latest Ref Rng & Units 10/26/2016 10/19/2016 08/31/2016  Glucose 65 - 99 mg/dL 165(H) 174(H) 160(H)  BUN 7 - 25 mg/dL 22 23 24(H)  Creatinine 0.70 - 1.11 mg/dL 2.24(H) 2.32(H) 2.00(H)  Sodium 135 - 146 mmol/L 138 134(L) 137  Potassium 3.5 - 5.3 mmol/L 4.6 4.5 4.3  Chloride 98 - 110 mmol/L 99 98 99  CO2 20 - 31 mmol/L 28 23 30   Calcium 8.6 - 10.3 mg/dL 10.0 9.2 9.7  Total Protein 6.1 - 8.1 g/dL 7.7 7.3 -  Total Bilirubin 0.2 - 1.2 mg/dL 0.7 0.6 -  Alkaline Phos 40 - 115 U/L 75 71 -  AST 10 - 35 U/L 15 17 -  ALT 9 - 46 U/L 9 9 -    Imaging: Dg Esophagus  Result Date: 10/22/2016 CLINICAL DATA:  Food getting stuck. History of EGD in 2010 with peptic stricture. EXAM: ESOPHOGRAM / BARIUM SWALLOW / BARIUM TABLET STUDY TECHNIQUE: Combined double contrast and single contrast examination performed using effervescent crystals, thick barium liquid, and thin barium liquid. The patient was observed with fluoroscopy swallowing a  13 mm barium sulphate tablet. FLUOROSCOPY TIME:  Fluoroscopy Time:  1 minutes 54 seconds Radiation Exposure Index (if provided by the fluoroscopic device): 12.6 mGy Number of Acquired Spot Images: 0 COMPARISON:  05/30/2014 FINDINGS: Lateral pharyngeal imaging shows no aspiration or obstructive process. There is good airway protection and epiglottis turnover. Suboptimal distention of the esophagus despite double contrast technique. There were frequent tertiary contractions and proximal escape, findings of presbyesophagus. No mucosal lesion was noted. Mild smooth narrowing at the GE junction. Notably delayed passage of a 13 mm barium tablet despite its relatively minor appearance, with narrowing presumably compounded by the degree of dysmotility. No hiatal hernia. Gastroesophageal reflux and poor clearance was seen when recumbent. IMPRESSION: 1. 13 mm barium tablet was slow to pass through the distal esophagus from mild smooth stricturing exacerbated by esophageal dysmotility (presbyesophagus). 2. Gastroesophageal reflux and stasis when recumbent. Electronically Signed   By: Monte Fantasia M.D.   On: 10/22/2016 09:24   Xr Knee 3 View Left  Result Date: 09/30/2016 Three-view x-ray of the left knee reveals marked medial compartment joint space narrowing. He does have more of a medial tibial plateau spur and some mild spurring of the medial femoral condyle. Also has. Trigger spurring laterally also. Sclerosing of the medial tibial plateau. Does have some. Articular changes in the patellofemoral joint.   Speciality Comments: No specialty comments available.    Procedures:  No procedures performed Allergies: Doxazosin mesylate and Methocarbamol   Assessment / Plan:     Visit Diagnoses: Seropositive rheumatoid arthritis ([place]): He is no active synovitis on examination.  ILD (interstitial lung disease) ([place]) he does have some bibasilar crackles. He denies any shortness of breath or any improvement in his  symptoms.  High risk medication use - Cellcept 500 mg po bid and Bactrim DS 1 tab po three times a week. His labs are stable. He does have low GFR. I plan to keep him on same dose for right now area he will check his labs every week for first month and then every 2 weeks for the next 2 months. Patient is an appointment coming up  with Dr. Chase Caller  Primary osteoarthritis of left knee: He has some discomfort in his left knee joint especially at night  History of right knee joint replacement: Doing well  DDD (degenerative disc disease), lumbar: The pain is tolerable History of carotid artery disease - on Plavix  History of hyperlipidemia  History of chronic heart failure  History of chronic kidney disease  History of diabetes mellitus  History of gastroesophageal reflux (GERD)  History of diverticulosis    Orders: Orders Placed This Encounter  Procedures  . CBC with Differential/Platelet  . COMPLETE METABOLIC PANEL WITH GFR   No orders of the defined types were placed in this encounter.   Follow-Up Instructions: Return in about 4 weeks (around 11/24/2016) for Rheumatoid arthritis,ILD.   [name], MD  Note - This record has been created using Editor, commissioning.  Chart creation errors have been sought, but may not always  have been located. Such creation errors do not reflect on  the standard of medical care.

## 2016-10-22 ENCOUNTER — Ambulatory Visit (HOSPITAL_COMMUNITY)
Admission: RE | Admit: 2016-10-22 | Discharge: 2016-10-22 | Disposition: A | Discharge status: Home / Self Care | Payer: Medicare Other | Source: Ambulatory Visit | Attending: Physician Assistant | Admitting: Physician Assistant

## 2016-10-22 DIAGNOSIS — K219 Gastro-esophageal reflux disease without esophagitis: Secondary | ICD-10-CM | POA: Diagnosis not present

## 2016-10-22 DIAGNOSIS — K224 Dyskinesia of esophagus: Secondary | ICD-10-CM | POA: Insufficient documentation

## 2016-10-22 DIAGNOSIS — R1310 Dysphagia, unspecified: Secondary | ICD-10-CM | POA: Diagnosis present

## 2016-10-22 DIAGNOSIS — T18120A Food in esophagus causing compression of trachea, initial encounter: Secondary | ICD-10-CM | POA: Diagnosis not present

## 2016-10-22 DIAGNOSIS — R110 Nausea: Secondary | ICD-10-CM | POA: Diagnosis present

## 2016-10-25 ENCOUNTER — Encounter: Payer: Self-pay | Admitting: Internal Medicine

## 2016-10-26 DIAGNOSIS — Z79899 Other long term (current) drug therapy: Secondary | ICD-10-CM | POA: Diagnosis not present

## 2016-10-26 LAB — CBC WITH DIFFERENTIAL/PLATELET
BASOS ABS: 81 {cells}/uL (ref 0–200)
Basophils Relative: 1 %
EOS PCT: 6 %
Eosinophils Absolute: 486 cells/uL (ref 15–500)
HEMATOCRIT: 32.2 % — AB (ref 38.5–50.0)
HEMOGLOBIN: 10 g/dL — AB (ref 13.2–17.1)
LYMPHS ABS: 1944 {cells}/uL (ref 850–3900)
Lymphocytes Relative: 24 %
MCH: 22.7 pg — AB (ref 27.0–33.0)
MCHC: 31.1 g/dL — ABNORMAL LOW (ref 32.0–36.0)
MCV: 73 fL — ABNORMAL LOW (ref 80.0–100.0)
MONO ABS: 648 {cells}/uL (ref 200–950)
MPV: 9.2 fL (ref 7.5–12.5)
Monocytes Relative: 8 %
NEUTROS ABS: 4941 {cells}/uL (ref 1500–7800)
Neutrophils Relative %: 61 %
Platelets: 339 10*3/uL (ref 140–400)
RBC: 4.41 MIL/uL (ref 4.20–5.80)
RDW: 17.7 % — ABNORMAL HIGH (ref 11.0–15.0)
WBC: 8.1 10*3/uL (ref 3.8–10.8)

## 2016-10-26 NOTE — Telephone Encounter (Signed)
I dw Dr Estanislado Pandy and she is 100% fine with me / [place] doing the cellcept and bactrim per plan outlined  Please go ahead  Dr. Brand Males, M.D., [facility].C.P Pulmonary and Critical Care Medicine Staff Physician [facility] Pager: [phone], If no answer or between  15:00h - 7:00h: call [phone]  10/26/2016 3:10 PM

## 2016-10-26 NOTE — Telephone Encounter (Signed)
Pt aware that we will take over his Cellcept and Bactrim if he is okay with this.  Aware also that Dr Estanislado Pandy has agreed with this.  Pt states that he is okay with MR prescribing these medications, he does not need any refills as of today.  Will send to MR as FYI.

## 2016-10-27 ENCOUNTER — Encounter: Payer: Self-pay | Admitting: Rheumatology

## 2016-10-27 ENCOUNTER — Ambulatory Visit (INDEPENDENT_AMBULATORY_CARE_PROVIDER_SITE_OTHER): Payer: Medicare Other | Admitting: Rheumatology

## 2016-10-27 VITALS — BP 172/90 | HR 83 | Ht 73.0 in | Wt 205.0 lb

## 2016-10-27 DIAGNOSIS — Z96651 Presence of right artificial knee joint: Secondary | ICD-10-CM

## 2016-10-27 DIAGNOSIS — Z8679 Personal history of other diseases of the circulatory system: Secondary | ICD-10-CM

## 2016-10-27 DIAGNOSIS — M059 Rheumatoid arthritis with rheumatoid factor, unspecified: Secondary | ICD-10-CM | POA: Diagnosis not present

## 2016-10-27 DIAGNOSIS — M1712 Unilateral primary osteoarthritis, left knee: Secondary | ICD-10-CM

## 2016-10-27 DIAGNOSIS — M5136 Other intervertebral disc degeneration, lumbar region: Secondary | ICD-10-CM | POA: Diagnosis not present

## 2016-10-27 DIAGNOSIS — Z8719 Personal history of other diseases of the digestive system: Secondary | ICD-10-CM | POA: Diagnosis not present

## 2016-10-27 DIAGNOSIS — J849 Interstitial pulmonary disease, unspecified: Secondary | ICD-10-CM

## 2016-10-27 DIAGNOSIS — Z87448 Personal history of other diseases of urinary system: Secondary | ICD-10-CM | POA: Diagnosis not present

## 2016-10-27 DIAGNOSIS — Z8639 Personal history of other endocrine, nutritional and metabolic disease: Secondary | ICD-10-CM | POA: Diagnosis not present

## 2016-10-27 DIAGNOSIS — M51369 Other intervertebral disc degeneration, lumbar region without mention of lumbar back pain or lower extremity pain: Secondary | ICD-10-CM

## 2016-10-27 DIAGNOSIS — Z79899 Other long term (current) drug therapy: Secondary | ICD-10-CM

## 2016-10-27 LAB — COMPLETE METABOLIC PANEL WITH GFR
ALBUMIN: 3.9 g/dL (ref 3.6–5.1)
ALK PHOS: 75 U/L (ref 40–115)
ALT: 9 U/L (ref 9–46)
AST: 15 U/L (ref 10–35)
BUN: 22 mg/dL (ref 7–25)
CO2: 28 mmol/L (ref 20–31)
Calcium: 10 mg/dL (ref 8.6–10.3)
Chloride: 99 mmol/L (ref 98–110)
Creat: 2.24 mg/dL — ABNORMAL HIGH (ref 0.70–1.11)
GFR, Est African American: 30 mL/min — ABNORMAL LOW (ref >=60)
GFR, Est Non African American: 26 mL/min — ABNORMAL LOW (ref >=60)
GLUCOSE: 165 mg/dL — AB (ref 65–99)
Potassium: 4.6 mmol/L (ref 3.5–5.3)
SODIUM: 138 mmol/L (ref 135–146)
Total Bilirubin: 0.7 mg/dL (ref 0.2–1.2)
Total Protein: 7.7 g/dL (ref 6.1–8.1)

## 2016-10-27 NOTE — Progress Notes (Signed)
Labs are stable.

## 2016-10-27 NOTE — Patient Instructions (Signed)
Standing Labs We placed an order today for your standing lab work.    Please come back and get your standing labs weekly for 2 more weeks then every two weeks for the next 2 months  We have open lab Monday through Friday from 8:30-11:30 AM and 1:30-4 PM at the office of Dr. [name].   The office is located at [address], [place], [place], [place] [address] No appointment is necessary.   Labs are drawn by [facility].  You may receive a bill from [facility] for your lab work. If you have any questions regarding directions or hours of operation,  please call [phone].

## 2016-10-27 NOTE — Progress Notes (Signed)
Rheumatology Medication Review by a Pharmacist Does the patient feel that his/her medications are working for him/her?  Yes Has the patient been experiencing any side effects to the medications prescribed?  No Does the patient have any problems obtaining medications?  No, Cellcept has now been approved by patient's insurance  Issues to address at subsequent visits: None   Pharmacist comments:  Mr. Gauger is a pleasant 83 yo M who presents for follow up.  He is currently taking mycophenolate 500 mg twice daily and sulfamethoxazole-trimethoprim 400-80 mg on Monday, Wednesday, and Friday.  Patient started this new regimen on 10/11/16.  Patient confirms he is no longer taking azathioprine (Imuran).  Patient has had two sets of weekly labs which have been stable.  Patient will be due for weekly labs again for the next two weeks then labs every 2 weeks for 2 months.  Patient voiced understanding of lab monitoring plan and denies any questions or concerns at this time.   Elisabeth Most, Pharm.D., BCPS, CPP Clinical Pharmacist Pager: [phone] Phone: [phone] 10/27/2016 9:25 AM

## 2016-10-28 ENCOUNTER — Other Ambulatory Visit: Payer: Self-pay | Admitting: Cardiovascular Disease

## 2016-10-28 NOTE — Telephone Encounter (Signed)
I did cut this in half due to hypotension and hypoperfusion stroke Refill as 6.25 bid x 1 year

## 2016-10-28 NOTE — Telephone Encounter (Signed)
[facility] is requesting a refill on Carvedilol 12.5 mg tablet taken 1 tablet BID. Pt's med list states that pt takes 1/2 tablet of Carvedilol 12.5 mg BID, changed by PCP, Dr. Silvio Pate. I called pt and pt stated that he takes carvedilol 12.5 mg tablet, 1 tablet BID. Please advise

## 2016-10-29 ENCOUNTER — Other Ambulatory Visit: Payer: Self-pay

## 2016-10-29 MED ORDER — METFORMIN HCL 1000 MG PO TABS: 1000.0000 mg | ORAL_TABLET | Freq: Every day | ORAL | 3 refills | Status: DC

## 2016-10-29 MED ORDER — CARVEDILOL 6.25 MG PO TABS: 6.2500 mg | ORAL_TABLET | Freq: Two times a day (BID) | ORAL | 11 refills | Status: DC

## 2016-10-29 NOTE — Telephone Encounter (Signed)
Spoke to pt

## 2016-10-29 NOTE — Telephone Encounter (Signed)
I changed the rx to 6.25mg  bid and sent to the pharmacy. I will let pt know to take a whole one.

## 2016-10-29 NOTE — Telephone Encounter (Signed)
Most refills have been going to [facility]. Request for metformin received by fax today. New rx sent to [facility]

## 2016-11-01 ENCOUNTER — Telehealth: Payer: Self-pay

## 2016-11-01 NOTE — Telephone Encounter (Signed)
Message  Received: 2 days ago  Message Contents  [name], MD sent to Jeoffrey Massed, RN        Last stent was May 17 ok to hold plavix for colonoscopy   Previous Messages    ----- Message -----  From: Jeoffrey Massed, RN  Sent: 10/22/2016 10:24 AM  To: [name], MD      Patient contacted and advised that he will be able to hold Plavix for 5 days prior to the EGD.  He is notified that he will get full instructions at the pre-visit on 12/21/16

## 2016-11-02 ENCOUNTER — Other Ambulatory Visit: Payer: Self-pay | Admitting: *Deleted

## 2016-11-02 DIAGNOSIS — Z79899 Other long term (current) drug therapy: Secondary | ICD-10-CM | POA: Diagnosis not present

## 2016-11-02 DIAGNOSIS — C4442 Squamous cell carcinoma of skin of scalp and neck: Secondary | ICD-10-CM | POA: Diagnosis not present

## 2016-11-02 DIAGNOSIS — L570 Actinic keratosis: Secondary | ICD-10-CM | POA: Diagnosis not present

## 2016-11-02 LAB — CBC WITH DIFFERENTIAL/PLATELET
BASOS PCT: 1 %
Basophils Absolute: 90 cells/uL (ref 0–200)
EOS PCT: 4 %
Eosinophils Absolute: 360 cells/uL (ref 15–500)
HCT: 33.4 % — ABNORMAL LOW (ref 38.5–50.0)
HEMOGLOBIN: 10.5 g/dL — AB (ref 13.2–17.1)
LYMPHS ABS: 2250 {cells}/uL (ref 850–3900)
Lymphocytes Relative: 25 %
MCH: 22.9 pg — ABNORMAL LOW (ref 27.0–33.0)
MCHC: 31.4 g/dL — AB (ref 32.0–36.0)
MCV: 72.8 fL — ABNORMAL LOW (ref 80.0–100.0)
MONO ABS: 810 {cells}/uL (ref 200–950)
MPV: 9.5 fL (ref 7.5–12.5)
Monocytes Relative: 9 %
NEUTROS PCT: 61 %
Neutro Abs: 5490 cells/uL (ref 1500–7800)
Platelets: 314 10*3/uL (ref 140–400)
RBC: 4.59 MIL/uL (ref 4.20–5.80)
RDW: 17.3 % — AB (ref 11.0–15.0)
WBC: 9 10*3/uL (ref 3.8–10.8)

## 2016-11-03 LAB — COMPLETE METABOLIC PANEL WITH GFR
ALBUMIN: 4 g/dL (ref 3.6–5.1)
ALT: 11 U/L (ref 9–46)
AST: 16 U/L (ref 10–35)
Alkaline Phosphatase: 78 U/L (ref 40–115)
BUN: 21 mg/dL (ref 7–25)
CHLORIDE: 96 mmol/L — AB (ref 98–110)
CO2: 26 mmol/L (ref 20–31)
CREATININE: 2.15 mg/dL — AB (ref 0.70–1.11)
Calcium: 9.6 mg/dL (ref 8.6–10.3)
GFR, Est African American: 32 mL/min — ABNORMAL LOW (ref >=60)
GFR, Est Non African American: 27 mL/min — ABNORMAL LOW (ref >=60)
GLUCOSE: 176 mg/dL — AB (ref 65–99)
POTASSIUM: 4.8 mmol/L (ref 3.5–5.3)
SODIUM: 136 mmol/L (ref 135–146)
Total Bilirubin: 0.6 mg/dL (ref 0.2–1.2)
Total Protein: 7.7 g/dL (ref 6.1–8.1)

## 2016-11-03 NOTE — Progress Notes (Signed)
Labs are stable.

## 2016-11-04 ENCOUNTER — Telehealth: Payer: Self-pay | Admitting: Radiology

## 2016-11-04 ENCOUNTER — Other Ambulatory Visit: Payer: Self-pay | Admitting: Radiology

## 2016-11-04 MED ORDER — MYCOPHENOLATE MOFETIL 500 MG PO TABS: ORAL_TABLET | ORAL | 0 refills | Status: DC

## 2016-11-04 NOTE — Telephone Encounter (Signed)
-----   Message from [name], MD sent at 11/03/2016  1:06 PM EDT ----- Labs are stable

## 2016-11-04 NOTE — Telephone Encounter (Signed)
I have called patient to advise labs are stable  

## 2016-11-04 NOTE — Telephone Encounter (Signed)
Advised patient of normal labs today. Ok to refill per Dr Estanislado Pandy  Cellcept sent for 90 day supply. Patient aware of lab plan he has follow up appt scheduled

## 2016-11-08 ENCOUNTER — Other Ambulatory Visit: Payer: Self-pay | Admitting: *Deleted

## 2016-11-08 MED ORDER — DULOXETINE HCL 30 MG PO CPEP: 30.0000 mg | ORAL_CAPSULE | Freq: Every day | ORAL | 3 refills | Status: DC

## 2016-11-10 ENCOUNTER — Other Ambulatory Visit: Payer: Self-pay

## 2016-11-10 DIAGNOSIS — Z79899 Other long term (current) drug therapy: Secondary | ICD-10-CM

## 2016-11-10 LAB — CBC WITH DIFFERENTIAL/PLATELET
Basophils Absolute: 79 cells/uL (ref 0–200)
Basophils Relative: 1 %
EOS PCT: 4 %
Eosinophils Absolute: 316 cells/uL (ref 15–500)
HCT: 32.5 % — ABNORMAL LOW (ref 38.5–50.0)
HEMOGLOBIN: 10 g/dL — AB (ref 13.2–17.1)
LYMPHS ABS: 1738 {cells}/uL (ref 850–3900)
Lymphocytes Relative: 22 %
MCH: 22.9 pg — ABNORMAL LOW (ref 27.0–33.0)
MCHC: 30.8 g/dL — AB (ref 32.0–36.0)
MCV: 74.4 fL — ABNORMAL LOW (ref 80.0–100.0)
MPV: 9.9 fL (ref 7.5–12.5)
Monocytes Absolute: 632 cells/uL (ref 200–950)
Monocytes Relative: 8 %
NEUTROS ABS: 5135 {cells}/uL (ref 1500–7800)
NEUTROS PCT: 65 %
PLATELETS: 286 10*3/uL (ref 140–400)
RBC: 4.37 MIL/uL (ref 4.20–5.80)
RDW: 17.4 % — ABNORMAL HIGH (ref 11.0–15.0)
WBC: 7.9 10*3/uL (ref 3.8–10.8)

## 2016-11-11 LAB — COMPLETE METABOLIC PANEL WITH GFR
ALBUMIN: 3.9 g/dL (ref 3.6–5.1)
ALK PHOS: 69 U/L (ref 40–115)
ALT: 9 U/L (ref 9–46)
AST: 15 U/L (ref 10–35)
BUN: 24 mg/dL (ref 7–25)
CALCIUM: 10.1 mg/dL (ref 8.6–10.3)
CHLORIDE: 98 mmol/L (ref 98–110)
CO2: 24 mmol/L (ref 20–32)
CREATININE: 2.18 mg/dL — AB (ref 0.70–1.11)
GFR, EST AFRICAN AMERICAN: 31 mL/min — AB (ref >=60)
GFR, Est Non African American: 27 mL/min — ABNORMAL LOW (ref >=60)
Glucose, Bld: 293 mg/dL — ABNORMAL HIGH (ref 65–99)
POTASSIUM: 4.3 mmol/L (ref 3.5–5.3)
Sodium: 137 mmol/L (ref 135–146)
Total Bilirubin: 0.5 mg/dL (ref 0.2–1.2)
Total Protein: 7.5 g/dL (ref 6.1–8.1)

## 2016-11-11 NOTE — Progress Notes (Signed)
Labs are stable.

## 2016-11-12 NOTE — Progress Notes (Signed)
Office Visit Note  Patient: [name]             Date of Birth: 10-01-1931           MRN: [id number]             PCP: [name], MD Referring: [name], MD Visit Date: 11/26/2016 Occupation: @GUAROCC @    Subjective:  Pain in joints and shortness of breath.   History of Present Illness: [name] is a 83 y.o. male with history of rheumatoid arthritis and interstitial lung disease. He continues to have some joint stiffness and discomfort. He states his feet have been hurting more recently. He was seen by podiatrist who did injections in his bilateral feet. He believes the discomfort is coming from neuropathy. He denies any joint swelling. He has not noticed much improvement in his breathing on CellCept. His been tolerating CellCept well. He states he has some GI symptoms recently and had his medications adjusted.  Activities of Daily Living:  Patient reports morning stiffness for 0 minute.   Patient Reports nocturnal pain. Knee pain Difficulty dressing/grooming: Denies Difficulty climbing stairs: Reports Difficulty getting out of chair: Reports Difficulty using hands for taps, buttons, cutlery, and/or writing: Denies   Review of Systems  Constitutional: Positive for fatigue. Negative for night sweats and weakness ( ).  HENT: Positive for mouth dryness. Negative for mouth sores and nose dryness.   Eyes: Negative for redness and dryness.  Respiratory: Negative for shortness of breath and difficulty breathing.   Cardiovascular: Negative for chest pain, palpitations, hypertension, irregular heartbeat and swelling in legs/feet.  Gastrointestinal: Negative for constipation and diarrhea.  Endocrine: Negative for increased urination.  Musculoskeletal: Positive for arthralgias, joint pain and morning stiffness. Negative for joint swelling, myalgias, muscle weakness, muscle tenderness and myalgias.  Skin: Negative for color change, rash, hair loss,  nodules/bumps, skin tightness, ulcers and sensitivity to sunlight.  Allergic/Immunologic: Negative for susceptible to infections.  Neurological: Negative for dizziness, fainting, memory loss and night sweats.  Hematological: Negative for swollen glands.  Psychiatric/Behavioral: Positive for sleep disturbance. Negative for depressed mood. The patient is not nervous/anxious.     PMFS History:  Patient Active Problem List   Diagnosis Date Noted  . High risk medication use 10/21/2016  . Primary osteoarthritis of both knees 10/21/2016  . History of right knee joint replacement 10/21/2016  . Orthostatic hypotension 10/01/2016  . Diarrhea 08/18/2016  . Tegretol-induced dizziness 05/14/2016  . Spinal stenosis of lumbar region without neurogenic claudication 03/23/2016  . Spondylosis without myelopathy or radiculopathy, lumbar region 03/23/2016  . Respiratory failure, chronic ([place]) 11/21/2014  . DM (diabetes mellitus) type II controlled, neurological manifestation ([place])   . Atherosclerotic heart disease of native coronary artery with angina pectoris ([place])   . Ventral hernia 12/17/2013  . Seropositive rheumatoid arthritis ([place])   . Routine general medical examination at a health care facility 08/29/2012  . CKD stage 3 due to type 2 diabetes mellitus ([place])   . ILD (interstitial lung disease) ([place]) 11/28/2011  . Chronic diastolic heart failure ([place]) 09/14/2011  . Nausea 02/16/2011  . Obstructive sleep apnea 12/17/2008  . ESOPHAGEAL STRICTURE 10/09/2008  . Reflux esophagitis 09/10/2008  . DIVERTICULOSIS-COLON 09/10/2008  . Thoracic aorta atherosclerosis ([place]) 03/19/2008  . ACTINIC KERATOSIS 10/23/2007  . SLEEP DISORDER, CHRONIC 10/17/2006  . HLD (hyperlipidemia) 09/23/2006  . Essential hypertension 09/23/2006  . GERD 09/23/2006  . BENIGN PROSTATIC HYPERTROPHY 09/23/2006    Past Medical History:  Diagnosis Date  . Arthritis    osteoarthritis, s/p R TKR, and digits  . CAD (coronary  artery disease)    a. s/p CABG (2001)  b. s/p DES to RCA and cutting POBA to ostial PDA (2013)   c. s/p DES to SVG to OM2 (01/14/14) d. cath: 08/2015 NSTEMI w/ patent LIMA-LAD and 99% stenosis of SVG-OM w/ DES placed. CTO of SVG-RCA and SVG-D1.   [name] Chronic diastolic CHF (congestive heart failure) ([place])    a) 09/13 ECHO- LVEF 50-09%, grade 1 diastolic dysfunction, mild LA dilatation, atrial septal aneurysm, AV mobility restricted, but no sig AS by doppler; b) 09/04/08 ECHO- LVH, ef 60%, mild AS, c. echo 08/2015: EF perserved of 55-60% with inferolateral HK. Mild AS noted.  . Chronic kidney disease, stage III (moderate)   . Chronic lower back pain   . Colon polyps   . Diverticulosis   . Dyspnea 2009 since July -Sept   05/06/08-CPST-  normal effort, reduced VO2 max 20.5 /65%, reduced at 8.2/ 40%, normal breathing resetvca of 55%, submaximal heart rate response 112/77%, flattened o2 pluse response at peak exercise-12 ml/beat @ 85%, No VQ mismatch abnormalities, All c/w CIRC Limitation  . Enlarged prostate   . Esophageal stricture    a. s/p dilation spring 2010  . GERD (gastroesophageal reflux disease)   . Heart murmur   . Hiatal hernia   . History of PFTs    mixed pattern on spiro. mild restn on lung volumes with near normal DLCO. Pattern can be explained by CABG scar. Fev1 2.2L/73%, ratio 68 (67), TLC 4.7/68%,RV 1.5L/55%,DLCO 79%  . Hyperlipidemia   . Hypertension   . Interstitial lung disease ([name])    NOS  . Overweight (BMI 25.0-29.9)    BMI 29  . RA (rheumatoid arthritis) ([name])    [name]  . Seropositive rheumatoid arthritis ([place])   . Type II diabetes mellitus ([name])     Family History  Problem Relation Age of Onset  . COPD Mother   . Heart disease Father   . Heart attack Father   . Diabetes Brother   . Colon cancer Brother 25  . Alcohol abuse Sister   . Stroke Sister    Past Surgical History:  Procedure Laterality Date  . CARDIAC CATHETERIZATION  08/2004   CP- no MI, Cath-  small vessell disease   . CARDIAC CATHETERIZATION  12/31/2011   80% distal LM, 100% native LAD, LCx and RCA, 30% prox SVG-OM, SVG-D1 normal, 99% distal, 80% ostial SVG-RCA distal to graft, LIMA-LAD normal; LVEF mildly decreased with posterior basal AK   . CARDIAC CATHETERIZATION  2009   with patent grafts/notes 12/31/2011  . CARDIAC CATHETERIZATION N/A 08/13/2015   Procedure: Left Heart Cath and Cors/Grafts Angiography;  Surgeon: Sherren Mocha, MD;  Location: [facility];  Service: Cardiovascular;  Laterality: N/A;  . CATARACT EXTRACTION W/ INTRAOCULAR LENS  IMPLANT, BILATERAL Bilateral   . CHOLECYSTECTOMY OPEN  11/2003   [name]  . CORONARY ANGIOPLASTY WITH STENT PLACEMENT  01/03/2012   Successful DES to SVG-RCA and cutting balloon angioplasty ostial  PDA   . CORONARY ANGIOPLASTY WITH STENT PLACEMENT  01/14/2014   "1"  . CORONARY ARTERY BYPASS GRAFT  11/1999   CABG X5  . CORONARY STENT PLACEMENT  02/2012   1 stent and balloon  . ESOPHAGOGASTRODUODENOSCOPY (EGD) WITH ESOPHAGEAL DILATION  2010  . HAND SURGERY    . JOINT REPLACEMENT    . KNEE ARTHROSCOPY Right 2008  . LEFT  AND RIGHT HEART CATHETERIZATION WITH CORONARY ANGIOGRAM  12/31/2011   Procedure: LEFT AND RIGHT HEART CATHETERIZATION WITH CORONARY ANGIOGRAM;  Surgeon: Burnell Blanks, MD;  Location: [facility] CATH LAB;  Service: Cardiovascular;;  . LEFT AND RIGHT HEART CATHETERIZATION WITH CORONARY ANGIOGRAM N/A 01/14/2014   Procedure: LEFT AND RIGHT HEART CATHETERIZATION WITH CORONARY ANGIOGRAM;  Surgeon: Peter M Martinique, MD;  Location: [facility] CATH LAB;  Service: Cardiovascular;  Laterality: N/A;  . PERCUTANEOUS CORONARY INTERVENTION-BALLOON ONLY  01/03/2012   Procedure: PERCUTANEOUS CORONARY INTERVENTION-BALLOON ONLY;  Surgeon: Peter M Martinique, MD;  Location: [facility] CATH LAB;  Service: Cardiovascular;;  . PERCUTANEOUS CORONARY STENT INTERVENTION (PCI-S)  12/31/2011   Procedure: PERCUTANEOUS CORONARY STENT INTERVENTION (PCI-S);  Surgeon:  Burnell Blanks, MD;  Location: [facility] CATH LAB;  Service: Cardiovascular;;  . PERCUTANEOUS CORONARY STENT INTERVENTION (PCI-S) N/A 01/03/2012   Procedure: PERCUTANEOUS CORONARY STENT INTERVENTION (PCI-S);  Surgeon: Peter M Martinique, MD;  Location: [facility] CATH LAB;  Service: Cardiovascular;  Laterality: N/A;  . SHOULDER ARTHROSCOPY WITH OPEN ROTATOR CUFF REPAIR AND DISTAL CLAVICLE ACROMINECTOMY Left 02/27/2013   Procedure: LEFT SHOULDER ARTHROSCOPY WITH MINI OPEN ROTATOR CUFF REPAIR AND SUBACROMIAL DECOMPRESSION AND DISTAL CLAVICLE RESECTION;  Surgeon: [name], MD;  Location: [facility];  Service: Orthopedics;  Laterality: Left;  . TOTAL KNEE ARTHROPLASTY Right 03/2010   [name]  . TRIGGER FINGER RELEASE Left 02/27/2013   Procedure: RELEASE TRIGGER FINGER/A-1 PULLEY;  Surgeon: [name], MD;  Location: [facility];  Service: Orthopedics;  Laterality: Left;   Social History   Social History Narrative   No living will   Requests wife as health care POA   Discussed DNR --he requests this (done 08/29/12)   Not sure about feeding tube---but might accept for some time   Patient lives with wife and daughter in a one story home.  Has 3 children.  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     Objective: Vital Signs: BP 140/82   Pulse 74   Resp 16   Ht 6\' 1"  (1.854 m)   Wt 202 lb (91.6 kg)   BMI 26.65 kg/m    Physical Exam  Constitutional: He is oriented to person, place, and time. He appears well-developed and well-nourished.  HENT:  Head: Normocephalic and atraumatic.  Eyes: Pupils are equal, round, and reactive to light. Conjunctivae and EOM are normal.  Neck: Normal range of motion. Neck supple.  Cardiovascular: Normal rate, regular rhythm and normal heart sounds.   Pulmonary/Chest: Effort normal and breath sounds normal.  Abdominal: Soft. Bowel sounds are normal.  Neurological: He is alert and oriented to person, place, and time.  Skin: Skin is warm and dry.  Capillary refill takes less than 2 seconds.  Psychiatric: He has a normal mood and affect. His behavior is normal.  Nursing note and vitals reviewed.    Musculoskeletal Exam: C-spine and thoracic lumbar spine limited range of motion. Shoulder joints elbow joints wrist joints are good range of motion. He has some synovial thickening over her MCP joints but no synovitis was noted. Hip joints knee joints are good doing well. His right total knee replacement is doing good. He has some discomfort in bilateral first MTP joint.  CDAI Exam: CDAI Homunculus Exam:   Tenderness:  Right foot: 1st MTP Left foot: 1st MTP  Joint Counts:  CDAI Tender Joint count: 0 CDAI Swollen Joint count: 0  Global Assessments:  Patient Global Assessment: 4 Provider Global Assessment: 4  CDAI Calculated Score:  8    Investigation: No additional findings. CBC Latest Ref Rng & Units 11/23/2016 11/10/2016 11/02/2016  WBC 3.8 - 10.8 K/uL 8.3 7.9 9.0  Hemoglobin 13.2 - 17.1 g/dL 10.0(L) 10.0(L) 10.5(L)  Hematocrit 38.5 - 50.0 % 33.1(L) 32.5(L) 33.4(L)  Platelets 140 - 400 K/uL 323 286 314   CMP     Component Value Date/Time   NA 136 11/23/2016 1028   NA 140 05/11/2016 1233   K 4.6 11/23/2016 1028   CL 98 11/23/2016 1028   CO2 24 11/23/2016 1028   GLUCOSE 244 (H) 11/23/2016 1028   BUN 22 11/23/2016 1028   BUN 36 (H) 05/11/2016 1233   CREATININE 1.93 (H) 11/23/2016 1028   CALCIUM 9.3 11/23/2016 1028   PROT 7.2 11/23/2016 1028   PROT 7.4 05/11/2016 1233   ALBUMIN 3.8 11/23/2016 1028   ALBUMIN 3.9 05/11/2016 1233   AST 16 11/23/2016 1028   ALT 10 11/23/2016 1028   ALKPHOS 73 11/23/2016 1028   BILITOT 0.6 11/23/2016 1028   BILITOT 0.3 05/11/2016 1233   GFRNONAA 31 (L) 11/23/2016 1028   GFRAA 36 (L) 11/23/2016 1028    Imaging: No results found.  Speciality Comments: No specialty comments available.    Procedures:  No procedures performed Allergies: Doxazosin mesylate and Methocarbamol    Assessment / Plan:     Visit Diagnoses: Seropositive rheumatoid arthritis ([place]) - +RF +CCP: He has no synovitis on examination he continues to have some synovial thickening and discomfort.  ILD (interstitial lung disease) ([place]): He has not noticed much improvement on CellCept. Although he is tolerating the medication well. Due to his low GFR we have him on low-dose. I would like Dr. Golden Pop opinion regarding that.  High risk medication use - Cellcept 500 mg po bid and Bactrim DS 1 tab po three times a week.[name] His labs are stable. We will continue to monitor his labs every 2 months. His next labs are due in October.  Primary osteoarthritis of both knees chronic pain  Spondylosis without myelopathy or radiculopathy, lumbar region  History of right knee joint replacement  His other medical problems are listed as follows:  Chronic diastolic heart failure ([place])  CKD stage 3 due to type 2 diabetes mellitus ([place])  Controlled type 2 diabetes mellitus with diabetic neuropathy, without long-term current use of insulin ([name])  Essential hypertension  Gastroesophageal reflux disease without esophagitis  Mixed hyperlipidemia  Obstructive sleep apnea  History of coronary artery disease - CABG    Orders: No orders of the defined types were placed in this encounter.  No orders of the defined types were placed in this encounter.     Follow-Up Instructions: Return in about 4 months (around 03/28/2017) for Rheumatoid arthritis ILD.   [name], MD  Note - This record has been created using Editor, commissioning.  Chart creation errors have been sought, but may not always  have been located. Such creation errors do not reflect on  the standard of medical care.

## 2016-11-15 DIAGNOSIS — R0689 Other abnormalities of breathing: Secondary | ICD-10-CM | POA: Diagnosis not present

## 2016-11-15 DIAGNOSIS — J8410 Pulmonary fibrosis, unspecified: Secondary | ICD-10-CM | POA: Diagnosis not present

## 2016-11-17 ENCOUNTER — Telehealth: Payer: Self-pay | Admitting: *Deleted

## 2016-11-17 NOTE — Telephone Encounter (Signed)
Spoke to pt. He will try stopping it and let us know what happens.

## 2016-11-17 NOTE — Telephone Encounter (Signed)
Patient left a voicemail stating that he wanted to let you know that he is still nauseated, vomiting and dizzy.

## 2016-11-17 NOTE — Telephone Encounter (Signed)
Please have him try stopping the isosorbide to see if that helps (and if he gets chest pain without it)

## 2016-11-23 ENCOUNTER — Other Ambulatory Visit: Payer: Self-pay | Admitting: *Deleted

## 2016-11-23 DIAGNOSIS — Z79899 Other long term (current) drug therapy: Secondary | ICD-10-CM

## 2016-11-23 LAB — CBC WITH DIFFERENTIAL/PLATELET
BASOS PCT: 1 %
Basophils Absolute: 83 cells/uL (ref 0–200)
EOS ABS: 415 {cells}/uL (ref 15–500)
Eosinophils Relative: 5 %
HEMATOCRIT: 33.1 % — AB (ref 38.5–50.0)
HEMOGLOBIN: 10 g/dL — AB (ref 13.2–17.1)
Lymphocytes Relative: 23 %
Lymphs Abs: 1909 cells/uL (ref 850–3900)
MCH: 22.3 pg — ABNORMAL LOW (ref 27.0–33.0)
MCHC: 30.2 g/dL — ABNORMAL LOW (ref 32.0–36.0)
MCV: 73.7 fL — AB (ref 80.0–100.0)
MONO ABS: 664 {cells}/uL (ref 200–950)
MPV: 9.4 fL (ref 7.5–12.5)
Monocytes Relative: 8 %
NEUTROS ABS: 5229 {cells}/uL (ref 1500–7800)
Neutrophils Relative %: 63 %
Platelets: 323 10*3/uL (ref 140–400)
RBC: 4.49 MIL/uL (ref 4.20–5.80)
RDW: 17.7 % — ABNORMAL HIGH (ref 11.0–15.0)
WBC: 8.3 10*3/uL (ref 3.8–10.8)

## 2016-11-24 LAB — COMPLETE METABOLIC PANEL WITH GFR
ALT: 10 U/L (ref 9–46)
AST: 16 U/L (ref 10–35)
Albumin: 3.8 g/dL (ref 3.6–5.1)
Alkaline Phosphatase: 73 U/L (ref 40–115)
BILIRUBIN TOTAL: 0.6 mg/dL (ref 0.2–1.2)
BUN: 22 mg/dL (ref 7–25)
CALCIUM: 9.3 mg/dL (ref 8.6–10.3)
CO2: 24 mmol/L (ref 20–32)
CREATININE: 1.93 mg/dL — AB (ref 0.70–1.11)
Chloride: 98 mmol/L (ref 98–110)
GFR, EST AFRICAN AMERICAN: 36 mL/min — AB (ref >=60)
GFR, Est Non African American: 31 mL/min — ABNORMAL LOW (ref >=60)
Glucose, Bld: 244 mg/dL — ABNORMAL HIGH (ref 65–99)
Potassium: 4.6 mmol/L (ref 3.5–5.3)
Sodium: 136 mmol/L (ref 135–146)
TOTAL PROTEIN: 7.2 g/dL (ref 6.1–8.1)

## 2016-11-24 NOTE — Progress Notes (Signed)
Labs are stable.

## 2016-11-26 ENCOUNTER — Encounter: Payer: Self-pay | Admitting: Rheumatology

## 2016-11-26 ENCOUNTER — Ambulatory Visit (INDEPENDENT_AMBULATORY_CARE_PROVIDER_SITE_OTHER): Payer: Medicare Other | Admitting: Rheumatology

## 2016-11-26 VITALS — BP 140/82 | HR 74 | Resp 16 | Ht 73.0 in | Wt 202.0 lb

## 2016-11-26 DIAGNOSIS — E1140 Type 2 diabetes mellitus with diabetic neuropathy, unspecified: Secondary | ICD-10-CM | POA: Diagnosis not present

## 2016-11-26 DIAGNOSIS — I5032 Chronic diastolic (congestive) heart failure: Secondary | ICD-10-CM | POA: Diagnosis not present

## 2016-11-26 DIAGNOSIS — E1122 Type 2 diabetes mellitus with diabetic chronic kidney disease: Secondary | ICD-10-CM

## 2016-11-26 DIAGNOSIS — Z96651 Presence of right artificial knee joint: Secondary | ICD-10-CM | POA: Diagnosis not present

## 2016-11-26 DIAGNOSIS — M47816 Spondylosis without myelopathy or radiculopathy, lumbar region: Secondary | ICD-10-CM | POA: Diagnosis not present

## 2016-11-26 DIAGNOSIS — I10 Essential (primary) hypertension: Secondary | ICD-10-CM

## 2016-11-26 DIAGNOSIS — M170 Bilateral primary osteoarthritis of knee: Secondary | ICD-10-CM

## 2016-11-26 DIAGNOSIS — J849 Interstitial pulmonary disease, unspecified: Secondary | ICD-10-CM

## 2016-11-26 DIAGNOSIS — Z79899 Other long term (current) drug therapy: Secondary | ICD-10-CM | POA: Diagnosis not present

## 2016-11-26 DIAGNOSIS — E782 Mixed hyperlipidemia: Secondary | ICD-10-CM | POA: Diagnosis not present

## 2016-11-26 DIAGNOSIS — G4733 Obstructive sleep apnea (adult) (pediatric): Secondary | ICD-10-CM

## 2016-11-26 DIAGNOSIS — K219 Gastro-esophageal reflux disease without esophagitis: Secondary | ICD-10-CM

## 2016-11-26 DIAGNOSIS — N183 Chronic kidney disease, stage 3 (moderate): Secondary | ICD-10-CM

## 2016-11-26 DIAGNOSIS — M059 Rheumatoid arthritis with rheumatoid factor, unspecified: Secondary | ICD-10-CM | POA: Diagnosis not present

## 2016-11-26 DIAGNOSIS — Z8679 Personal history of other diseases of the circulatory system: Secondary | ICD-10-CM

## 2016-11-26 NOTE — Patient Instructions (Signed)
Standing Labs We placed an order today for your standing lab work.    Please come back and get your standing labs in October and every 2 months  We have open lab Monday through Friday from 8:30-11:30 AM and 1:30-4 PM at the office of Dr. [name].   The office is located at [address] [place], [place], [place] [address] No appointment is necessary.   Labs are drawn by [facility].  You may receive a bill from [facility] for your lab work. If you have any questions regarding directions or hours of operation,  please call [phone].

## 2016-12-03 ENCOUNTER — Telehealth: Payer: Self-pay | Admitting: *Deleted

## 2016-12-03 NOTE — Telephone Encounter (Signed)
Patient left a voicemail stating that Dr. Silvio Pate took him off of his Isosorbide and he is still is having dizzy spells and nausea.

## 2016-12-05 NOTE — Telephone Encounter (Signed)
If he is not having chest pains, it is best if he still stays off the isosorbide. Ask him if there is any pattern---like when he first gets up, or turns quickly, etc

## 2016-12-07 NOTE — Telephone Encounter (Signed)
Spoke to pt. He said he will call and schedule an appt if he is not better in a week.

## 2016-12-07 NOTE — Telephone Encounter (Signed)
Make sure he is only taking 1/2 tab of the carvedilol bid as well. After that, if it is still a problem, might need to consider stopping this as well (but should be seen again before we do this)

## 2016-12-07 NOTE — Telephone Encounter (Signed)
Spoke to pt. He said he does not see a pattern. Michela Pitcher he can just be walking and gets dizzy. Still nauseous.

## 2016-12-09 DIAGNOSIS — E119 Type 2 diabetes mellitus without complications: Secondary | ICD-10-CM | POA: Diagnosis not present

## 2016-12-14 NOTE — Progress Notes (Signed)
CARDIOLOGY OFFICE NOTE  Date:  12/23/2016    [name] Date of Birth: 1932-02-21 Medical Record #[id number]  PCP:  [name], MD  Cardiologist:  [name] chief complaint on file.   History of Present Illness: [name] is a 83 y.o. male who presents today for f/u CAD.  CABG in 2001 Known occluded SVG RCA and D1. Mild AS, HTN, HLD , DM and CKD. Last cath 08/22/15 Showed continued patency of LIMA to LAD and SVG to OM with in-stent restenosis to body of graft Rx with another DES Has some left to right collaterals supplying RCA  As native RCA also occluded. Chronic dyspnea on oxygen at night Not thought to be related to heart.   Echo : 01/13/16  Mean gradient 12 mmHg peak 26 mmhg  Study Conclusions  - Left ventricle: The cavity size was normal. Wall thickness was   normal. Systolic function was normal. The estimated ejection   fraction was in the range of 55% to 60%. Wall motion was normal;   there were no regional wall motion abnormalities. Features are   consistent with a pseudonormal left ventricular filling pattern,   with concomitant abnormal relaxation and increased filling   pressure (grade 2 diastolic dysfunction). - Aortic valve: There was mild stenosis. There was mild   regurgitation. Valve area (VTI): 1.94 cm^2. Valve area (Vmax):   1.78 cm^2. Valve area (Vmean): 1.74 cm^2. - Right ventricle: Systolic function was mildly reduced. - Pulmonary arteries: Systolic pressure was moderately increased.   PA peak pressure: 46 mm Hg (S).   Interval history having lots of GI issues and nausea. Has lost weight Using zofran Has seen GI And apparently cannot get EGD until November No chest pain chronic dyspnea with oxygen at night  Past Medical History:  Diagnosis Date  . Arthritis    osteoarthritis, s/p R TKR, and digits  . CAD (coronary artery disease)    a. s/p CABG (2001)  b. s/p DES to RCA and cutting POBA to ostial PDA (2013)   c. s/p DES to SVG  to OM2 (01/14/14) d. cath: 08/2015 NSTEMI w/ patent LIMA-LAD and 99% stenosis of SVG-OM w/ DES placed. CTO of SVG-RCA and SVG-D1.   [name] Chronic diastolic CHF (congestive heart failure) ([place])    a) 09/13 ECHO- LVEF 53-97%, grade 1 diastolic dysfunction, mild LA dilatation, atrial septal aneurysm, AV mobility restricted, but no sig AS by doppler; b) 09/04/08 ECHO- LVH, ef 60%, mild AS, c. echo 08/2015: EF perserved of 55-60% with inferolateral HK. Mild AS noted.  . Chronic kidney disease, stage III (moderate)   . Chronic lower back pain   . Colon polyps   . Diverticulosis   . Dyspnea 2009 since July -Sept   05/06/08-CPST-  normal effort, reduced VO2 max 20.5 /65%, reduced at 8.2/ 40%, normal breathing resetvca of 55%, submaximal heart rate response 112/77%, flattened o2 pluse response at peak exercise-12 ml/beat @ 85%, No VQ mismatch abnormalities, All c/w CIRC Limitation  . Enlarged prostate   . Esophageal stricture    a. s/p dilation spring 2010  . GERD (gastroesophageal reflux disease)   . Heart murmur   . Hiatal hernia   . History of PFTs    mixed pattern on spiro. mild restn on lung volumes with near normal DLCO. Pattern can be explained by CABG scar. Fev1 2.2L/73%, ratio 68 (67), TLC 4.7/68%,RV 1.5L/55%,DLCO 79%  . Hyperlipidemia   . Hypertension   .  Interstitial lung disease ([name])    NOS  . Overweight (BMI 25.0-29.9)    BMI 29  . RA (rheumatoid arthritis) ([name])    [name]  . Seropositive rheumatoid arthritis ([place])   . Type II diabetes mellitus ([place])     Past Surgical History:  Procedure Laterality Date  . CARDIAC CATHETERIZATION  08/2004   CP- no MI, Cath- small vessell disease   . CARDIAC CATHETERIZATION  12/31/2011   80% distal LM, 100% native LAD, LCx and RCA, 30% prox SVG-OM, SVG-D1 normal, 99% distal, 80% ostial SVG-RCA distal to graft, LIMA-LAD normal; LVEF mildly decreased with posterior basal AK   . CARDIAC CATHETERIZATION  2009   with patent grafts/notes 12/31/2011  .  CARDIAC CATHETERIZATION N/A 08/13/2015   Procedure: Left Heart Cath and Cors/Grafts Angiography;  Surgeon: Sherren Mocha, MD;  Location: [place] [facility];  Service: Cardiovascular;  Laterality: N/A;  . CATARACT EXTRACTION W/ INTRAOCULAR LENS  IMPLANT, BILATERAL Bilateral   . CHOLECYSTECTOMY OPEN  11/2003   [name]  . CORONARY ANGIOPLASTY WITH STENT PLACEMENT  01/03/2012   Successful DES to SVG-RCA and cutting balloon angioplasty ostial  PDA   . CORONARY ANGIOPLASTY WITH STENT PLACEMENT  01/14/2014   "1"  . CORONARY ARTERY BYPASS GRAFT  11/1999   CABG X5  . CORONARY STENT PLACEMENT  02/2012   1 stent and balloon  . ESOPHAGOGASTRODUODENOSCOPY (EGD) WITH ESOPHAGEAL DILATION  2010  . HAND SURGERY    . JOINT REPLACEMENT    . KNEE ARTHROSCOPY Right 2008  . LEFT AND RIGHT HEART CATHETERIZATION WITH CORONARY ANGIOGRAM  12/31/2011   Procedure: LEFT AND RIGHT HEART CATHETERIZATION WITH CORONARY ANGIOGRAM;  Surgeon: Burnell Blanks, MD;  Location: [facility] CATH LAB;  Service: Cardiovascular;;  . LEFT AND RIGHT HEART CATHETERIZATION WITH CORONARY ANGIOGRAM N/A 01/14/2014   Procedure: LEFT AND RIGHT HEART CATHETERIZATION WITH CORONARY ANGIOGRAM;  Surgeon: Xavier Munger M Martinique, MD;  Location: [facility] C[facility] LAB;  Service: Cardiovascular;  Laterality: N/A;  . PERCUTANEOUS CORONARY INTERVENTION-BALLOON ONLY  01/03/2012   Procedure: PERCUTANEOUS CORONARY INTERVENTION-BALLOON ONLY;  Surgeon: Devyn Sheerin M Martinique, MD;  Location: [facility] CATH LAB;  Service: Cardiovascular;;  . PERCUTANEOUS CORONARY STENT INTERVENTION (PCI-S)  12/31/2011   Procedure: PERCUTANEOUS CORONARY STENT INTERVENTION (PCI-S);  Surgeon: Burnell Blanks, MD;  Location: [facility] CATH LAB;  Service: Cardiovascular;;  . PERCUTANEOUS CORONARY STENT INTERVENTION (PCI-S) N/A 01/03/2012   Procedure: PERCUTANEOUS CORONARY STENT INTERVENTION (PCI-S);  Surgeon: Celsa Nordahl M Martinique, MD;  Location: [facility] CATH LAB;  Service: Cardiovascular;  Laterality: N/A;  . SHOULDER ARTHROSCOPY  WITH OPEN ROTATOR CUFF REPAIR AND DISTAL CLAVICLE ACROMINECTOMY Left 02/27/2013   Procedure: LEFT SHOULDER ARTHROSCOPY WITH MINI OPEN ROTATOR CUFF REPAIR AND SUBACROMIAL DECOMPRESSION AND DISTAL CLAVICLE RESECTION;  Surgeon: [name], MD;  Location: [place];  Service: Orthopedics;  Laterality: Left;  . TOTAL KNEE ARTHROPLASTY Right 03/2010   [name]  . TRIGGER FINGER RELEASE Left 02/27/2013   Procedure: RELEASE TRIGGER FINGER/A-1 PULLEY;  Surgeon: [name], MD;  Location: [place];  Service: Orthopedics;  Laterality: Left;     Medications: Current Outpatient Prescriptions  Medication Sig Dispense Refill  . aspirin EC 81 MG tablet Take 81 mg by mouth at bedtime.    [name] atorvastatin (LIPITOR) 80 MG tablet Take 1 tablet (80 mg total) by mouth daily at 6 PM. 30 tablet 11  . carvedilol (COREG) 6.25 MG tablet Take 1 tablet (6.25 mg total) by mouth 2 (two) times daily with a meal. 60  tablet 11  . clopidogrel (PLAVIX) 75 MG tablet Take 1 tablet (75 mg total) by mouth daily with breakfast. 30 tablet 11  . DHA-Vitamin C-Lutein (EYE HEALTH FORMULA PO) Take 1 tablet by mouth daily.     . furosemide (LASIX) 40 MG tablet TAKE 1 OR 2 TABLETS BY MOUTH DAILY 60 tablet 10  . losartan (COZAAR) 100 MG tablet Take 50 mg by mouth daily.     . metFORMIN (GLUCOPHAGE) 1000 MG tablet Take 1 tablet (1,000 mg total) by mouth daily with breakfast. 30 tablet 3  . Multiple Vitamin (MULTIVITAMIN WITH MINERALS) TABS tablet Take 1 tablet by mouth daily.    . mycophenolate (CELLCEPT) 500 MG tablet 1bid 180 tablet 0  . nitroGLYCERIN (NITROSTAT) 0.4 MG SL tablet DISSOLVE 1 TABLET UNDER THE TONGUE FOR CHEST PAIN. MAY REPEAT EVERY 5MINUTES UP TO 3 DOSES. IF NO RELIEF, CALL 911** 25 tablet 5  . ondansetron (ZOFRAN) 4 MG tablet TAKE ONE TABLET BY MOUTH EVERY 8 HOURS AS NEEDED FOR NAUSEA OR VOMITING 60 tablet 1  . pantoprazole (PROTONIX) 40 MG tablet Take 1 tablet (40 mg total) by mouth 2 (two) times daily. 60 tablet 3    . sulfamethoxazole-trimethoprim (BACTRIM) 400-80 MG tablet 1 tablet po 3 times a week 12 tablet 2  . valproic acid (DEPAKENE) 250 MG capsule Take 1 tablet daily for 2 weeks, then increase to 1 tablet twice daily 60 capsule 5  . Blood Glucose Monitoring Suppl (ONE TOUCH ULTRA 2) w/Device KIT Use to obtain blood sugar daily. Dx Code E11.40 1 each 0  . glucose blood (ONE TOUCH ULTRA TEST) test strip USE TO CHECK BLOOD SUGAR ONCE A DAY Dx Code E11.40 100 each 3  . ONETOUCH DELICA LANCETS 78M MISC     . OXYGEN Inhale into the lungs. Per pt- Uses Oxygen 2 liters at night.     No current facility-administered medications for this visit.     Allergies: Allergies  Allergen Reactions  . Doxazosin Mesylate Other (See Comments)    dizziness  . Methocarbamol Rash    Social History: The patient  reports that he quit smoking about 53 years ago. His smoking use included Cigarettes. He has a 20.00 pack-year smoking history. He has never used smokeless tobacco. He reports that he does not drink alcohol or use drugs.   Family History: The patient's family history includes Alcohol abuse in his sister; COPD in his mother; Colon cancer (age of onset: 45) in his brother; Diabetes in his brother; Heart attack in his father; Heart disease in his father; Stroke in his sister.   Review of Systems: Please see the history of present illness.   Otherwise, the review of systems is positive for none.   All other systems are reviewed and negative.   Physical Exam: VS:  BP (!) 146/88   Pulse 76   Ht _0  (1.854 m)   Wt 199 lb (90.3 kg)   BMI 26.25 kg/m  .  BMI Body mass index is 26.25 kg/m.  Wt Readings from Last 3 Encounters:  12/23/16 199 lb (90.3 kg)  12/21/16 198 lb (89.8 kg)  12/16/16 199 lb (90.3 kg)    Affect appropriate Chronically ill pale white male  HEENT: normal Neck supple with no adenopathy JVP normal no bruits no thyromegaly Lungs basilar crackles bilateral  no wheezing and good  diaphragmatic motion Heart:  S1/S2 AS  murmur, no rub, gallop or click PMI normal Abdomen: benighn, BS positve, no tenderness, no  AAA no bruit.  No HSM or HJR Distal pulses intact with no bruits Trace bilateral  edema Neuro non-focal Skin warm and dry No muscular weakness   LABORATORY DATA:  EKG:   08/25/15 SR rate 66 inferior lateral T wave inversions   Lab Results  Component Value Date   WBC 8.3 11/23/2016   HGB 10.0 (L) 11/23/2016   HCT 33.1 (L) 11/23/2016   PLT 323 11/23/2016   GLUCOSE 244 (H) 11/23/2016   CHOL 109 10/14/2016   TRIG 177.0 (H) 10/14/2016   HDL 30.20 (L) 10/14/2016   LDLCALC 43 10/14/2016   ALT 10 11/23/2016   AST 16 11/23/2016   NA 136 11/23/2016   K 4.6 11/23/2016   CL 98 11/23/2016   CREATININE 1.93 (H) 11/23/2016   BUN 22 11/23/2016   CO2 24 11/23/2016   TSH 2.51 08/17/2011   PSA 1.83 02/17/2009   INR 1.12 08/13/2015   HGBA1C 8.3 (H) 10/14/2016   MICROALBUR 0.5 02/04/2009    BNP (last 3 results) No results for input(s): BNP in the last 8760 hours.  ProBNP (last 3 results) No results for input(s): PROBNP in the last 8760 hours.   Other Studies Reviewed Today:  Echo Study Conclusions from 08/2015  - Left ventricle: The cavity size was normal. Wall thickness was  increased in a pattern of mild LVH. Systolic function was normal.  The estimated ejection fraction was in the range of 55% to 60%.  There is hypokinesis of the inferolateral myocardium. Doppler  parameters are consistent with abnormal left ventricular  relaxation (grade 1 diastolic dysfunction). Doppler parameters  are consistent with high ventricular filling pressure. - Aortic valve: Valve mobility was restricted. There was mild  stenosis. There was mild regurgitation. Valve area (VTI): 1.87  cm^2. Valve area (Vmax): 2.08 cm^2. Valve area (Vmean): 1.9 cm^2. - Mitral valve: Calcified annulus. Mildly thickened leaflets .  There was mild regurgitation. - Left atrium:  The atrium was mildly dilated. - Pulmonary arteries: Systolic pressure was mildly increased. PA  peak pressure: 36 mm Hg (S).  Impressions:  - Hypokinesis of the inferior lateral wall with overall preserved  LV function; grade 1 diastolic dysfunction with elevated LV  filling pressure; mild LAE; calcified aortic valve with mild AS  and mild AI; mild MR and TR; mildly elevated pulmonary pressure.  Procedures 08/13/15    Left Heart Cath and Cors/Grafts Angiography    Conclusion     Prox RCA lesion, 100% stenosed.  LIMA .  The LIMA to LAD graft is widely patent without stenosis.  Sequential SVG .  The sequential saphenous vein graft to OM1 and OM 2 has critical stenosis within the previously stented segment in the proximal body of the graft.  SVG .  Graft known to be totally occluded, not selectively injected  Origin lesion, 100% stenosed.  SVG .  The graft is chronically occluded  Prox Graft lesion, 100% stenosed.  Prox LAD lesion, 100% stenosed.  Ost LM lesion, 80% stenosed.  Ost 2nd Mrg to 2nd Mrg lesion, 90% stenosed.  Prox Graft to Mid Graft lesion, before 1st Mrg, 99% stenosed. Post intervention, there is a 0% residual stenosis. The lesion was previously treated with a stent (unknown type).  1. Severe native three-vessel coronary artery disease with severe left main stenosis, total occlusion of the LAD, total occlusion of the RCA, and severe native left circumflex stenosis  2. Status post aortocoronary bypass surgery with continued patency of the LIMA to LAD and continued  patency of the saphenous vein graft to OM with severe stenosis in the proximal body of the graft (in-stent)  3. Chronic total occlusion of the saphenous graft RCA and saphenous vein graft to diagonal  4. Successful PCI of the saphenous vein graft OM with placement of a drug-eluting stent(treatment of in-stent restenosis)  Recommendation: dual antiplatelet therapy with aspirin  and Plavix at least 12 months. Consider long-term therapy if tolerated in this patient with previous bypass grafting who has required multiple PCI procedures.             Assessment and Plan:  CAD:  Post CABG with occluded graft to RCA and D1. Multiple stents to SVG OM body most recently May 2017.  Continue DAT. RCA fills by left to right collaterals   AS: mild f/u echo 01/2017 mean gradient 12 mmHg echo October 2017   Dyspnea: PA pressure only 47 mmhg last echo Bronchiectasis mild on CT 07/2015 Does not desat with ambulation Wearing oxygen at night. F/U with Dr Chase Caller Exam still abnormal with basilar crackles   HTN:  Well controlled.  Continue current medications and low sodium Dash type diet.    Cholesterol:  On statin  Lab Results  Component Value Date   LDLCALC 43 10/14/2016   GI:  Weight loss nausea etiology not clear f/u EGD and see GI

## 2016-12-16 ENCOUNTER — Ambulatory Visit (INDEPENDENT_AMBULATORY_CARE_PROVIDER_SITE_OTHER): Payer: Medicare Other | Admitting: Neurology

## 2016-12-16 ENCOUNTER — Encounter: Payer: Self-pay | Admitting: Neurology

## 2016-12-16 VITALS — BP 152/74 | HR 104 | Ht 73.0 in | Wt 199.0 lb

## 2016-12-16 DIAGNOSIS — M0550 Rheumatoid polyneuropathy with rheumatoid arthritis of unspecified site: Secondary | ICD-10-CM | POA: Diagnosis not present

## 2016-12-16 DIAGNOSIS — J8410 Pulmonary fibrosis, unspecified: Secondary | ICD-10-CM | POA: Diagnosis not present

## 2016-12-16 DIAGNOSIS — R0689 Other abnormalities of breathing: Secondary | ICD-10-CM | POA: Diagnosis not present

## 2016-12-16 DIAGNOSIS — E0842 Diabetes mellitus due to underlying condition with diabetic polyneuropathy: Secondary | ICD-10-CM

## 2016-12-16 MED ORDER — VALPROIC ACID 250 MG PO CAPS: ORAL_CAPSULE | ORAL | 5 refills | Status: DC

## 2016-12-16 NOTE — Progress Notes (Signed)
Follow-up Visit   Date: 12/16/16    [name] MRN: [id number] DOB: 04-29-1931   Interim History: [name] is a 83 y.o. right-handed Caucasian male with CAD, GERD, interstitial lung disease, rheumatoid arthritis (on imuran), aortic stenosis, CKD stage 3, hyperlipidemia, and hypertension returning to the clinic for follow-up of neuropathy.  The patient was accompanied to the clinic by self.  History of present illness: He was diagnosed with diabetes about 10-15 years ago and recall havinghaving burning pain involving the toes. Since then, there has been gradual worsening in intensity of pain and it now also involves the dorsum of the foot. There is no weakness, imbalance, or gait difficulty. He still walks independently and denies any falls. He takes gabapentin '900mg'$  in the morning, '600mg'$  in the afternoon, and '600mg'$  at bedtime which provides about 50% relief. He has previously tried nortriptyline but does not recall whether it did not work or stopped it because of side effects. He was tried on Cymbalta but this made him very dizzy and sleepy. He has seen podiatry for this and had steroid injections for capsulitis which did not provide much relief. He takes imuran for rheumatoid arthritis. He has chronic low back pain and gets ESI several times per year.   He has been tried on desipramine (no benefit), lidocaine/Aspercream (minimal benefit), gabapentin (high dose despite GFR 35, no lasting benefit).  In late 2017, he was started on Lyrica '100mg'$  twice and unable to titrate high because of leg edema and CKD.  UPDATE 08/13/2016:  He is here for follow-up.  Unfortunately, carbamazepine made him too dizzy so it was discontinued.  He is back on Lyrica '100mg'$  twice daily, which helps some but it is too expensive and he would like to stop it.  His neuropathy continues to be very painful.  No new weakness or falls.  UPDATE 12/16/2016:  At his last visit, Cymbalta '30mg'$  daily was  started and he reports no improvement with pain and feeling nausea/vomiting, He would like to discontinue the medication.  Despite all the medications we have tried, he has not appreciated any relief of pain.  He has suffered one fall a few weeks ago because of lightheadedness and fortunately did not injury himself.  He does not use a cane when walking. He denies any weakness of the legs.    Medications:  Current Outpatient Prescriptions on File Prior to Visit  Medication Sig Dispense Refill  . aspirin EC 81 MG tablet Take 81 mg by mouth at bedtime.    [name] atorvastatin (LIPITOR) 80 MG tablet Take 1 tablet (80 mg total) by mouth daily at 6 PM. 30 tablet 11  . Blood Glucose Monitoring Suppl (ONE TOUCH ULTRA 2) w/Device KIT Use to obtain blood sugar daily. Dx Code E11.40 1 each 0  . carvedilol (COREG) 6.25 MG tablet Take 1 tablet (6.25 mg total) by mouth 2 (two) times daily with a meal. 60 tablet 11  . clopidogrel (PLAVIX) 75 MG tablet Take 1 tablet (75 mg total) by mouth daily with breakfast. 30 tablet 11  . DHA-Vitamin C-Lutein (EYE HEALTH FORMULA PO) Take 1 tablet by mouth daily.     . DULoxetine (CYMBALTA) 30 MG capsule Take 1 capsule (30 mg total) by mouth daily. 90 capsule 3  . furosemide (LASIX) 40 MG tablet TAKE 1 OR 2 TABLETS BY MOUTH DAILY 60 tablet 10  . glucose blood (ONE TOUCH ULTRA TEST) test strip USE TO CHECK BLOOD SUGAR ONCE A DAY  Dx Code E11.40 100 each 3  . isosorbide mononitrate (IMDUR) 30 MG 24 hr tablet     . metFORMIN (GLUCOPHAGE) 1000 MG tablet Take 1 tablet (1,000 mg total) by mouth daily with breakfast. 30 tablet 3  . Multiple Vitamin (MULTIVITAMIN WITH MINERALS) TABS tablet Take 1 tablet by mouth daily.    . mycophenolate (CELLCEPT) 500 MG tablet 1bid 180 tablet 0  . nitroGLYCERIN (NITROSTAT) 0.4 MG SL tablet DISSOLVE 1 TABLET UNDER THE TONGUE FOR CHEST PAIN. MAY REPEAT EVERY 5MINUTES UP TO 3 DOSES. IF NO RELIEF, CALL 911** 25 tablet 5  . ondansetron (ZOFRAN) 4 MG tablet  Take 1 tablet (4 mg total) by mouth every 8 (eight) hours as needed for nausea or vomiting. 20 tablet 0  . ONETOUCH DELICA LANCETS 78E MISC     . pantoprazole (PROTONIX) 40 MG tablet Take 1 tablet (40 mg total) by mouth 2 (two) times daily. 60 tablet 3  . sulfamethoxazole-trimethoprim (BACTRIM) 400-80 MG tablet 1 tablet po 3 times a week 12 tablet 2   No current facility-administered medications on file prior to visit.     Allergies:  Allergies  Allergen Reactions  . Doxazosin Mesylate Other (See Comments)    dizziness  . Methocarbamol Rash    Review of Systems:  CONSTITUTIONAL: No fevers, chills, night sweats, or weight loss.  EYES: No visual changes or eye pain ENT: No hearing changes.  No history of nose bleeds.   RESPIRATORY: No cough, wheezing and shortness of breath.   CARDIOVASCULAR: Negative for chest pain, and palpitations.   GI: +for abdominal discomfort, blood in stools or black stools.  No recent change in bowel habits.   GU:  No history of incontinence.   MUSCLOSKELETAL: No history of joint pain or swelling.  No myalgias.   SKIN: Negative for lesions, rash, and itching.   ENDOCRINE: Negative for cold or heat intolerance, polydipsia or goiter.   PSYCH:  No depression or anxiety symptoms.   NEURO: As Above.   Vital Signs:  BP (!) 152/74   Pulse (!) 104   Ht '6\' 1"'$  (1.854 m)   Wt 199 lb (90.3 kg)   SpO2 94%   BMI 26.25 kg/m   Neurological Exam: MENTAL STATUS including orientation to time, place, person, recent and remote memory, attention span and concentration, language, and fund of knowledge is normal.  Speech is not dysarthric.  CRANIAL NERVES:  Face is symmetric.  MOTOR:  Motor strength is 5/5 in all extremities.  No pronator drift.  Tone is normal.    MSRs:  Reflexes are 2+/4 throughout, except absent Achilles.  SENSORY:  Vibration reduced distal to ankles bilaterally, intact at knees. Rhomberg testing is positive.   COORDINATION/GAIT:   Gait narrow  based and stable, unasissted.     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  Previously tried medications: gabapentin, nortriptyline, despiramine, lidocaine ointment, Aspercream, carbamazepine, Lyrica, Cymbalta. I will start depakote '250mg'$  at bedtime and titrate x 2 weeks, then '250mg'$  BID.  Side effects discussed.  Although there are no dosing adjustments needed for his renal disease, I will try to keep him on a low dose.  Unfortunately, if  he does not respond to this, I will have to refer him to Pain Management for their guidance as I do not have anything else to offer.  He was encouraged to use a cane for balance.  He will call with update in 4 weeks.   Greater than 50% of this 25 minute visit was spent in counseling, explanation of diagnosis, planning of further management, and coordination of care.    Thank you for allowing me to participate in patient's care.  If I can answer any additional questions, I would be pleased to do so.    Sincerely,    Emmarae Cowdery K. Posey Pronto, DO

## 2016-12-16 NOTE — Patient Instructions (Addendum)
Star valproate 250mg  at bedtime for two weeks, then increase to 1 tablet twice daily  Call with an update in 4 weeks

## 2016-12-17 ENCOUNTER — Ambulatory Visit: Payer: Medicare Other | Admitting: Neurology

## 2016-12-20 ENCOUNTER — Other Ambulatory Visit: Payer: Self-pay

## 2016-12-20 NOTE — Patient Outreach (Signed)
[facility]) Care Management  12/20/2016  [name] 09/29/1931 [id number]   Medication Adherence call to [name] the reason for this call is because [name] is showing past due on two of his medication Metformin 1000 mg and Losartan 100 mg try calling [name] but the phone just ring and no answer spoke to [place] talk  to [facility] she said the [name] is only taking 1 tablet of the Metformin 1000 mg(instead of 2 tablets a day) and losartan 100 mg he had not pick up they have ready for him to be pick up.     [facility] Management Direct Dial [phone]  Fax [phone] [name]

## 2016-12-21 ENCOUNTER — Other Ambulatory Visit: Payer: Self-pay | Admitting: Internal Medicine

## 2016-12-21 ENCOUNTER — Ambulatory Visit (AMBULATORY_SURGERY_CENTER): Payer: Self-pay

## 2016-12-21 VITALS — Ht 73.0 in | Wt 198.0 lb

## 2016-12-21 DIAGNOSIS — R1310 Dysphagia, unspecified: Secondary | ICD-10-CM

## 2016-12-21 NOTE — Progress Notes (Signed)
Per pt, no allergies to soy or egg products.   Pt came into the office today for his pre-visit prior to his endoscopy on 12/28/16 with Dr Henrene Pastor. After asking pt questions, he stated he is on Oxygen 2 liters at night and    has been on 02 for 3-4 years. Pt was informed he would need an office visit to discuss the oxygen. Pt was scheduled an OV with Amy-PA on 12/23/16. The endoscopy was not cx'd at this time. Pt is still on his Plavix and will discuss when to stop it at his office visit.  Pt refused Emmi video.

## 2016-12-21 NOTE — Telephone Encounter (Signed)
Last Rx 10/01/2016 #20. Last OV 10/2016. pls advise

## 2016-12-22 ENCOUNTER — Telehealth: Payer: Self-pay

## 2016-12-22 ENCOUNTER — Other Ambulatory Visit: Payer: Self-pay

## 2016-12-22 DIAGNOSIS — R1310 Dysphagia, unspecified: Secondary | ICD-10-CM

## 2016-12-22 NOTE — Telephone Encounter (Signed)
Approved: #[phone] x 0 

## 2016-12-22 NOTE — Telephone Encounter (Signed)
-----   Message from Alfredia Ferguson, PA-C sent at 12/22/2016 10:34 AM EDT ----- Regarding: EGD at [facility]- this pt is on my schedule for tomorrow- please take him off - He already saw Anderson Malta , had a barium swallow that shows a stricture - got set up for a pre visit (not sure why) before EGD , and then previsit nurse noticed he is on Oxygen at night and now sending him back for office visit to discuss ! GEEZ - !!  He already has cardiology OK to come off Plavix  Also - he just needs scheduled for EGD /Dil with Dr Henrene Pastor in a hospital slot , and hold Plavix 5 days before .   Too much run around for the poor guy ,could have been avoided if we knew he was on O2, and anyone seen in office shouldn't have to also have a previsit.Marland KitchenMarland KitchenMarland Kitchen

## 2016-12-22 NOTE — Telephone Encounter (Signed)
Appt cancelled for OV. Pt scheduled for EGD with dil at [facility] 03/01/17@11am , pt to arrive there at 9:30am. Pt aware of appt and prep instructions mailed to pt. Conf #[id number].

## 2016-12-23 ENCOUNTER — Ambulatory Visit (INDEPENDENT_AMBULATORY_CARE_PROVIDER_SITE_OTHER): Payer: Medicare Other | Admitting: Cardiovascular Disease

## 2016-12-23 ENCOUNTER — Ambulatory Visit: Payer: Medicare Other | Admitting: Physician Assistant

## 2016-12-23 ENCOUNTER — Encounter: Payer: Self-pay | Admitting: Cardiovascular Disease

## 2016-12-23 VITALS — BP 146/88 | HR 76 | Ht 73.0 in | Wt 199.0 lb

## 2016-12-23 DIAGNOSIS — I700 Atherosclerosis of aorta: Secondary | ICD-10-CM

## 2016-12-23 DIAGNOSIS — I10 Essential (primary) hypertension: Secondary | ICD-10-CM

## 2016-12-23 NOTE — Patient Instructions (Signed)

## 2016-12-28 ENCOUNTER — Encounter: Payer: Medicare Other | Admitting: Internal Medicine

## 2016-12-29 DIAGNOSIS — L821 Other seborrheic keratosis: Secondary | ICD-10-CM | POA: Diagnosis not present

## 2016-12-29 DIAGNOSIS — L570 Actinic keratosis: Secondary | ICD-10-CM | POA: Diagnosis not present

## 2016-12-29 DIAGNOSIS — Z85828 Personal history of other malignant neoplasm of skin: Secondary | ICD-10-CM | POA: Diagnosis not present

## 2016-12-29 DIAGNOSIS — D225 Melanocytic nevi of trunk: Secondary | ICD-10-CM | POA: Diagnosis not present

## 2016-12-29 DIAGNOSIS — L820 Inflamed seborrheic keratosis: Secondary | ICD-10-CM | POA: Diagnosis not present

## 2017-01-03 ENCOUNTER — Ambulatory Visit (INDEPENDENT_AMBULATORY_CARE_PROVIDER_SITE_OTHER): Payer: Medicare Other | Admitting: Internal Medicine

## 2017-01-03 ENCOUNTER — Encounter: Payer: Self-pay | Admitting: Internal Medicine

## 2017-01-03 VITALS — BP 132/82 | HR 73 | Temp 97.7°F | Wt 208.0 lb

## 2017-01-03 DIAGNOSIS — I951 Orthostatic hypotension: Secondary | ICD-10-CM | POA: Diagnosis not present

## 2017-01-03 DIAGNOSIS — Z23 Encounter for immunization: Secondary | ICD-10-CM

## 2017-01-03 MED ORDER — CARVEDILOL 3.125 MG PO TABS
3.1250 mg | ORAL_TABLET | Freq: Two times a day (BID) | ORAL | 3 refills | Status: DC
Start: 2017-01-03 — End: 2018-01-17

## 2017-01-03 NOTE — Addendum Note (Signed)
Addended by: Verlene Mayer A on: 01/03/2017 02:47 PM   Modules accepted: Orders

## 2017-01-03 NOTE — Progress Notes (Signed)
Subjective:    Patient ID: [name], male    DOB: 02-Jun-1931, 83 y.o.   MRN: [id number]  HPI here due to dizziness  It is now much more seldom Stopped the isosorbide Cut carvedilol in half Woke up a week ago and "felt the best I had in months" Good all week  Bad day yesterday--got nauseated when sitting in truck going to church Still went and started feeling better Back to feeling good again  Reviewed these issues with Dr Johnsie Cancel EKG okay---has scheduled echo again  Some sharp pains up under left ribs Tender at times No fall or injury  Stays constipation Hasn't tried anything  Current Outpatient Prescriptions on File Prior to Visit  Medication Sig Dispense Refill  . aspirin EC 81 MG tablet Take 81 mg by mouth at bedtime.    [name] atorvastatin (LIPITOR) 80 MG tablet Take 1 tablet (80 mg total) by mouth daily at 6 PM. 30 tablet 11  . Blood Glucose Monitoring Suppl (ONE TOUCH ULTRA 2) w/Device KIT Use to obtain blood sugar daily. Dx Code E11.40 1 each 0  . carvedilol (COREG) 6.25 MG tablet Take 1 tablet (6.25 mg total) by mouth 2 (two) times daily with a meal. 60 tablet 11  . clopidogrel (PLAVIX) 75 MG tablet Take 1 tablet (75 mg total) by mouth daily with breakfast. 30 tablet 11  . DHA-Vitamin C-Lutein (EYE HEALTH FORMULA PO) Take 1 tablet by mouth daily.     . furosemide (LASIX) 40 MG tablet TAKE 1 OR 2 TABLETS BY MOUTH DAILY 60 tablet 10  . glucose blood (ONE TOUCH ULTRA TEST) test strip USE TO CHECK BLOOD SUGAR ONCE A DAY Dx Code E11.40 100 each 3  . losartan (COZAAR) 100 MG tablet Take 50 mg by mouth daily.     . metFORMIN (GLUCOPHAGE) 1000 MG tablet Take 1 tablet (1,000 mg total) by mouth daily with breakfast. 30 tablet 3  . Multiple Vitamin (MULTIVITAMIN WITH MINERALS) TABS tablet Take 1 tablet by mouth daily.    . mycophenolate (CELLCEPT) 500 MG tablet 1bid 180 tablet 0  . nitroGLYCERIN (NITROSTAT) 0.4 MG SL tablet DISSOLVE 1 TABLET UNDER THE TONGUE FOR CHEST PAIN.  MAY REPEAT EVERY 5MINUTES UP TO 3 DOSES. IF NO RELIEF, CALL 911** 25 tablet 5  . ondansetron (ZOFRAN) 4 MG tablet TAKE ONE TABLET BY MOUTH EVERY 8 HOURS AS NEEDED FOR NAUSEA OR VOMITING 60 tablet 1  . ONETOUCH DELICA LANCETS 40J MISC     . OXYGEN Inhale into the lungs. Per pt- Uses Oxygen 2 liters at night.    . pantoprazole (PROTONIX) 40 MG tablet Take 1 tablet (40 mg total) by mouth 2 (two) times daily. 60 tablet 3  . sulfamethoxazole-trimethoprim (BACTRIM) 400-80 MG tablet 1 tablet po 3 times a week 12 tablet 2  . valproic acid (DEPAKENE) 250 MG capsule Take 1 tablet daily for 2 weeks, then increase to 1 tablet twice daily 60 capsule 5   No current facility-administered medications on file prior to visit.     Allergies  Allergen Reactions  . Doxazosin Mesylate Other (See Comments)    dizziness  . Methocarbamol Rash    Past Medical History:  Diagnosis Date  . Arthritis    osteoarthritis, s/p R TKR, and digits  . CAD (coronary artery disease)    a. s/p CABG (2001)  b. s/p DES to RCA and cutting POBA to ostial PDA (2013)   c. s/p DES to SVG to OM2 (01/14/14)  d. cath: 08/2015 NSTEMI w/ patent LIMA-LAD and 99% stenosis of SVG-OM w/ DES placed. CTO of SVG-RCA and SVG-D1.   [name] Chronic diastolic CHF (congestive heart failure) ([place])    a) 09/13 ECHO- LVEF 74-16%, grade 1 diastolic dysfunction, mild LA dilatation, atrial septal aneurysm, AV mobility restricted, but no sig AS by doppler; b) 09/04/08 ECHO- LVH, ef 60%, mild AS, c. echo 08/2015: EF perserved of 55-60% with inferolateral HK. Mild AS noted.  . Chronic kidney disease, stage III (moderate) ([place])   . Chronic lower back pain   . Colon polyps   . Diverticulosis   . Dyspnea 2009 since July -Sept   05/06/08-CPST-  normal effort, reduced VO2 max 20.5 /65%, reduced at 8.2/ 40%, normal breathing resetvca of 55%, submaximal heart rate response 112/77%, flattened o2 pluse response at peak exercise-12 ml/beat @ 85%, No VQ mismatch abnormalities,  All c/w CIRC Limitation  . Enlarged prostate   . Esophageal stricture    a. s/p dilation spring 2010  . GERD (gastroesophageal reflux disease)   . Heart murmur   . Hiatal hernia   . History of PFTs    mixed pattern on spiro. mild restn on lung volumes with near normal DLCO. Pattern can be explained by CABG scar. Fev1 2.2L/73%, ratio 68 (67), TLC 4.7/68%,RV 1.5L/55%,DLCO 79%  . Hyperlipidemia   . Hypertension   . Interstitial lung disease ([name])    NOS  . Overweight (BMI 25.0-29.9)    BMI 29  . RA (rheumatoid arthritis) ([name])    [name]  . Seropositive rheumatoid arthritis ([place])   . Type II diabetes mellitus ([place])     Past Surgical History:  Procedure Laterality Date  . CARDIAC CATHETERIZATION  08/2004   CP- no MI, Cath- small vessell disease   . CARDIAC CATHETERIZATION  12/31/2011   80% distal LM, 100% native LAD, LCx and RCA, 30% prox SVG-OM, SVG-D1 normal, 99% distal, 80% ostial SVG-RCA distal to graft, LIMA-LAD normal; LVEF mildly decreased with posterior basal AK   . CARDIAC CATHETERIZATION  2009   with patent grafts/notes 12/31/2011  . CARDIAC CATHETERIZATION N/A 08/13/2015   Procedure: Left Heart Cath and Cors/Grafts Angiography;  Surgeon: Sherren Mocha, MD;  Location: [facility];  Service: Cardiovascular;  Laterality: N/A;  . CATARACT EXTRACTION W/ INTRAOCULAR LENS  IMPLANT, BILATERAL Bilateral   . CHOLECYSTECTOMY OPEN  11/2003   [name]  . CORONARY ANGIOPLASTY WITH STENT PLACEMENT  01/03/2012   Successful DES to SVG-RCA and cutting balloon angioplasty ostial  PDA   . CORONARY ANGIOPLASTY WITH STENT PLACEMENT  01/14/2014   "1"  . CORONARY ARTERY BYPASS GRAFT  11/1999   CABG X5  . CORONARY STENT PLACEMENT  02/2012   1 stent and balloon  . ESOPHAGOGASTRODUODENOSCOPY (EGD) WITH ESOPHAGEAL DILATION  2010  . HAND SURGERY    . JOINT REPLACEMENT    . KNEE ARTHROSCOPY Right 2008  . LEFT AND RIGHT HEART CATHETERIZATION WITH CORONARY ANGIOGRAM  12/31/2011   Procedure:  LEFT AND RIGHT HEART CATHETERIZATION WITH CORONARY ANGIOGRAM;  Surgeon: Burnell Blanks, MD;  Location: [facility] CATH LAB;  Service: Cardiovascular;;  . LEFT AND RIGHT HEART CATHETERIZATION WITH CORONARY ANGIOGRAM N/A 01/14/2014   Procedure: LEFT AND RIGHT HEART CATHETERIZATION WITH CORONARY ANGIOGRAM;  Surgeon: Peter M Martinique, MD;  Location: [facility] C[facility] LAB;  Service: Cardiovascular;  Laterality: N/A;  . PERCUTANEOUS CORONARY INTERVENTION-BALLOON ONLY  01/03/2012   Procedure: PERCUTANEOUS CORONARY INTERVENTION-BALLOON ONLY;  Surgeon: Peter M Martinique, MD;  Location: [facility]  CATH LAB;  Service: Cardiovascular;;  . PERCUTANEOUS CORONARY STENT INTERVENTION (PCI-S)  12/31/2011   Procedure: PERCUTANEOUS CORONARY STENT INTERVENTION (PCI-S);  Surgeon: Burnell Blanks, MD;  Location: [facility] CATH LAB;  Service: Cardiovascular;;  . PERCUTANEOUS CORONARY STENT INTERVENTION (PCI-S) N/A 01/03/2012   Procedure: PERCUTANEOUS CORONARY STENT INTERVENTION (PCI-S);  Surgeon: Peter M Martinique, MD;  Location: [facility] CATH LAB;  Service: Cardiovascular;  Laterality: N/A;  . SHOULDER ARTHROSCOPY WITH OPEN ROTATOR CUFF REPAIR AND DISTAL CLAVICLE ACROMINECTOMY Left 02/27/2013   Procedure: LEFT SHOULDER ARTHROSCOPY WITH MINI OPEN ROTATOR CUFF REPAIR AND SUBACROMIAL DECOMPRESSION AND DISTAL CLAVICLE RESECTION;  Surgeon: [name], MD;  Location: [facility];  Service: Orthopedics;  Laterality: Left;  . TOTAL KNEE ARTHROPLASTY Right 03/2010   [name]  . TRIGGER FINGER RELEASE Left 02/27/2013   Procedure: RELEASE TRIGGER FINGER/A-1 PULLEY;  Surgeon: [name], MD;  Location: [facility];  Service: Orthopedics;  Laterality: Left;    Family History  Problem Relation Age of Onset  . COPD Mother   . Heart disease Father   . Heart attack Father   . Diabetes Brother   . Colon cancer Brother 25  . Alcohol abuse Sister   . Stroke Sister     Social History   Social History  . Marital status: Married    Spouse name: N/A  . Number  of children: 3  . Years of education: N/A   Occupational History  . Conservation officer, nature Retired    retired   Social History Main Topics  . Smoking status: Former Smoker    Packs/day: 1.00    Years: 20.00    Types: Cigarettes    Quit date: 04/06/1963  . Smokeless tobacco: Never Used  . Alcohol use No     Comment: 01/01/2012 "last alcohol ~ 50 yr ago"  . Drug use: No  . Sexual activity: Not Currently   Other Topics Concern  . Not on file   Social History Narrative   No living will   Requests wife as health care POA   Discussed DNR --he requests this (done 08/29/12)   Not sure about feeding tube---but might accept for some time   Patient lives with wife and daughter in a one story home.  Has 3 children.  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   Review of Systems  No problems with the cellcept--but too soon to tell if any better Regular blood monitoring with [name] Has chest congestion and some sputum. Doesn't really feel sick though Was due for EGD--now deferred to November     Objective:   Physical Exam  Neck: No thyromegaly present.  Cardiovascular: Normal rate and regular rhythm.  Exam reveals no gallop.   Pulmonary/Chest:  Slight tenderness at lower edge of left anterior ribs Little air movement Bibasilar dry crackles  Lymphadenopathy:    He has no cervical adenopathy.  Psychiatric: He has a normal mood and affect. His behavior is normal.          Assessment & Plan:

## 2017-01-03 NOTE — Patient Instructions (Signed)
Please start miralax 17gm daily for the constipation. If that hasn't worked within several days, you can add senna-s 2 tabs daily to this.

## 2017-01-03 NOTE — Assessment & Plan Note (Signed)
Better off isosorbide and on cutting carvedilol No new angina Will continue these meds  Has left chest pain--but seems to be rib related

## 2017-01-04 ENCOUNTER — Ambulatory Visit: Payer: Medicare Other | Admitting: Rheumatology

## 2017-01-07 ENCOUNTER — Other Ambulatory Visit: Payer: Self-pay | Admitting: Rheumatology

## 2017-01-10 ENCOUNTER — Telehealth: Payer: Self-pay | Admitting: Rheumatology

## 2017-01-10 DIAGNOSIS — N183 Chronic kidney disease, stage 3 (moderate): Secondary | ICD-10-CM | POA: Diagnosis not present

## 2017-01-10 DIAGNOSIS — N400 Enlarged prostate without lower urinary tract symptoms: Secondary | ICD-10-CM | POA: Diagnosis not present

## 2017-01-10 DIAGNOSIS — M069 Rheumatoid arthritis, unspecified: Secondary | ICD-10-CM | POA: Diagnosis not present

## 2017-01-10 DIAGNOSIS — J849 Interstitial pulmonary disease, unspecified: Secondary | ICD-10-CM | POA: Diagnosis not present

## 2017-01-10 NOTE — Telephone Encounter (Signed)
Last Visit: 11/26/16 Next Visit: 04/25/17  Okay to refill per Dr. Estanislado Pandy

## 2017-01-10 NOTE — Telephone Encounter (Signed)
Patient would like to know when he is due for labs. Please call to advise.

## 2017-01-11 NOTE — Telephone Encounter (Signed)
No answer/ unable to reach

## 2017-01-11 NOTE — Telephone Encounter (Signed)
Every 8 weeks Labs due 01/23/17 Called to advise

## 2017-01-12 NOTE — Telephone Encounter (Signed)
Patient's daughter Hassan Rowan advised patient is dyue for labs every 8 weeks. Patient will be due 01/23/17

## 2017-01-13 ENCOUNTER — Telehealth: Payer: Self-pay | Admitting: Neurology

## 2017-01-13 ENCOUNTER — Other Ambulatory Visit: Payer: Self-pay | Admitting: Cardiovascular Disease

## 2017-01-13 NOTE — Telephone Encounter (Signed)
Patient has been given instructions and I will send referral to [facility].

## 2017-01-13 NOTE — Telephone Encounter (Signed)
You started him on Depakote and said if that does not work, you would have to refer him to pain management.  Please advise.

## 2017-01-13 NOTE — Telephone Encounter (Signed)
Please to him to stop the medication and place a referral to pain management for painful neuropathy.  Leighla Chestnutt K. Posey Pronto, DO

## 2017-01-13 NOTE — Telephone Encounter (Signed)
Patient was calling to let Dr. Posey Pronto know that the new medication she started him on 2 weeks ago is not seeming to help.  Please Advise. Thanks

## 2017-01-15 DIAGNOSIS — R0689 Other abnormalities of breathing: Secondary | ICD-10-CM | POA: Diagnosis not present

## 2017-01-15 DIAGNOSIS — J8410 Pulmonary fibrosis, unspecified: Secondary | ICD-10-CM | POA: Diagnosis not present

## 2017-01-17 ENCOUNTER — Other Ambulatory Visit: Payer: Self-pay

## 2017-01-17 ENCOUNTER — Ambulatory Visit (HOSPITAL_COMMUNITY): Payer: Medicare Other | Attending: Cardiovascular Disease

## 2017-01-17 ENCOUNTER — Telehealth: Payer: Self-pay

## 2017-01-17 DIAGNOSIS — I352 Nonrheumatic aortic (valve) stenosis with insufficiency: Secondary | ICD-10-CM | POA: Insufficient documentation

## 2017-01-17 DIAGNOSIS — I110 Hypertensive heart disease with heart failure: Secondary | ICD-10-CM | POA: Insufficient documentation

## 2017-01-17 DIAGNOSIS — Z87891 Personal history of nicotine dependence: Secondary | ICD-10-CM | POA: Diagnosis not present

## 2017-01-17 DIAGNOSIS — I350 Nonrheumatic aortic (valve) stenosis: Secondary | ICD-10-CM

## 2017-01-17 DIAGNOSIS — E785 Hyperlipidemia, unspecified: Secondary | ICD-10-CM | POA: Insufficient documentation

## 2017-01-17 DIAGNOSIS — E119 Type 2 diabetes mellitus without complications: Secondary | ICD-10-CM | POA: Diagnosis not present

## 2017-01-17 DIAGNOSIS — I351 Nonrheumatic aortic (valve) insufficiency: Secondary | ICD-10-CM

## 2017-01-17 DIAGNOSIS — I509 Heart failure, unspecified: Secondary | ICD-10-CM | POA: Insufficient documentation

## 2017-01-17 NOTE — Telephone Encounter (Signed)
Patient aware of results. Per Dr. Johnsie Cancel, EF normal mild to moderate AR and mild AR f/u echo in a year. Patient verbalized understanding. Will put in order for echo for one year out.

## 2017-01-17 NOTE — Telephone Encounter (Signed)
-----   Message from [name], MD sent at 01/17/2017  3:07 PM EDT ----- EF normal mild to moderate AR and mild AR f/u echo in a year

## 2017-01-19 DIAGNOSIS — H35372 Puckering of macula, left eye: Secondary | ICD-10-CM | POA: Diagnosis not present

## 2017-01-19 DIAGNOSIS — H353221 Exudative age-related macular degeneration, left eye, with active choroidal neovascularization: Secondary | ICD-10-CM | POA: Diagnosis not present

## 2017-01-19 DIAGNOSIS — D3131 Benign neoplasm of right choroid: Secondary | ICD-10-CM | POA: Diagnosis not present

## 2017-01-19 DIAGNOSIS — H353213 Exudative age-related macular degeneration, right eye, with inactive scar: Secondary | ICD-10-CM | POA: Diagnosis not present

## 2017-01-25 ENCOUNTER — Other Ambulatory Visit: Payer: Self-pay

## 2017-01-25 DIAGNOSIS — Z79899 Other long term (current) drug therapy: Secondary | ICD-10-CM | POA: Diagnosis not present

## 2017-01-25 LAB — CBC WITH DIFFERENTIAL/PLATELET
BASOS ABS: 37 {cells}/uL (ref 0–200)
BASOS PCT: 0.5 %
EOS ABS: 340 {cells}/uL (ref 15–500)
Eosinophils Relative: 4.6 %
HEMATOCRIT: 30.3 % — AB (ref 38.5–50.0)
HEMOGLOBIN: 9.2 g/dL — AB (ref 13.2–17.1)
LYMPHS ABS: 1665 {cells}/uL (ref 850–3900)
MCH: 22.3 pg — AB (ref 27.0–33.0)
MCHC: 30.4 g/dL — AB (ref 32.0–36.0)
MCV: 73.4 fL — AB (ref 80.0–100.0)
MPV: 10.4 fL (ref 7.5–12.5)
Monocytes Relative: 8.8 %
NEUTROS ABS: 4706 {cells}/uL (ref 1500–7800)
Neutrophils Relative %: 63.6 %
Platelets: 285 10*3/uL (ref 140–400)
RBC: 4.13 10*6/uL — ABNORMAL LOW (ref 4.20–5.80)
RDW: 16.4 % — ABNORMAL HIGH (ref 11.0–15.0)
Total Lymphocyte: 22.5 %
WBC: 7.4 10*3/uL (ref 3.8–10.8)
WBCMIX: 651 {cells}/uL (ref 200–950)

## 2017-01-25 LAB — COMPLETE METABOLIC PANEL WITH GFR
AG Ratio: 1.2 (calc) (ref 1.0–2.5)
ALBUMIN MSPROF: 4 g/dL (ref 3.6–5.1)
ALT: 9 U/L (ref 9–46)
AST: 14 U/L (ref 10–35)
Alkaline phosphatase (APISO): 69 U/L (ref 40–115)
BILIRUBIN TOTAL: 0.6 mg/dL (ref 0.2–1.2)
BUN / CREAT RATIO: 11 (calc) (ref 6–22)
BUN: 23 mg/dL (ref 7–25)
CHLORIDE: 98 mmol/L (ref 98–110)
CO2: 29 mmol/L (ref 20–32)
Calcium: 9.3 mg/dL (ref 8.6–10.3)
Creat: 2.04 mg/dL — ABNORMAL HIGH (ref 0.70–1.11)
GFR, EST AFRICAN AMERICAN: 34 mL/min/{1.73_m2} — AB (ref >=60)
GFR, Est Non African American: 29 mL/min/{1.73_m2} — ABNORMAL LOW (ref >=60)
GLOBULIN: 3.4 g/dL (ref 1.9–3.7)
GLUCOSE: 222 mg/dL — AB (ref 65–99)
Potassium: 4.6 mmol/L (ref 3.5–5.3)
SODIUM: 137 mmol/L (ref 135–146)
TOTAL PROTEIN: 7.4 g/dL (ref 6.1–8.1)

## 2017-01-26 NOTE — Progress Notes (Signed)
Anemia is worse. He should follow up with PCP regarding anemia.

## 2017-01-28 ENCOUNTER — Telehealth: Payer: Self-pay | Admitting: Neurology

## 2017-01-28 NOTE — Telephone Encounter (Signed)
Patient called needing to check on status of the referral to the pain clinic. Please call his cell. Thanks  [phone]

## 2017-01-28 NOTE — Telephone Encounter (Signed)
Patient notified that I will get in touch with Pain Management.

## 2017-01-31 ENCOUNTER — Encounter: Payer: Self-pay | Admitting: Internal Medicine

## 2017-01-31 ENCOUNTER — Telehealth: Payer: Self-pay

## 2017-01-31 ENCOUNTER — Ambulatory Visit (INDEPENDENT_AMBULATORY_CARE_PROVIDER_SITE_OTHER): Payer: Medicare Other | Admitting: Internal Medicine

## 2017-01-31 VITALS — BP 128/70 | HR 83 | Ht 73.0 in | Wt 204.4 lb

## 2017-01-31 DIAGNOSIS — J849 Interstitial pulmonary disease, unspecified: Secondary | ICD-10-CM | POA: Diagnosis not present

## 2017-01-31 DIAGNOSIS — J209 Acute bronchitis, unspecified: Secondary | ICD-10-CM

## 2017-01-31 DIAGNOSIS — R110 Nausea: Secondary | ICD-10-CM

## 2017-01-31 DIAGNOSIS — R0600 Dyspnea, unspecified: Secondary | ICD-10-CM

## 2017-01-31 DIAGNOSIS — R079 Chest pain, unspecified: Secondary | ICD-10-CM

## 2017-01-31 MED ORDER — CEPHALEXIN 500 MG PO CAPS: 500.0000 mg | ORAL_CAPSULE | Freq: Three times a day (TID) | ORAL | 0 refills | Status: DC

## 2017-01-31 MED ORDER — PREDNISONE 10 MG PO TABS: ORAL_TABLET | ORAL | 0 refills | Status: DC

## 2017-01-31 NOTE — Progress Notes (Signed)
Subjective:     Patient ID: [name], male   DOB: 1932-03-19, 83 y.o.   MRN: [id number]  HPI   83 yo male former smoker with RA-ILD   TEST     - Coronary artery disease. s/p CABG x 5 in 2002.  Dyspnea started following lopressor Jan-July 2009 and lisinopril increae July 2009. No relief despite stopping agents July 2010  -   Myoview on April 17, 2007 was  nonischemic with an EF of 51% and inferobasal wall scar.   - CAth Oct 2009:    - . Severe native three-vessel coronary artery disease. 2. Status post multivessel coronary bypass surgery with all grafts  patent. Normal LVEF and LVEDP - PFT - Mixed pattern on spiro. Mild restn on lung volumeswith near normal DLCO.   - Fev1 2.2L/73%, FVC 3.21L/70%, Ratio 68 (67),  TLC 4.7L/68%, RV 1.5L/55%, DLCO 79%.  -CPST 05/06/2008: Normal effot.    - Reduced VO2 max 20.5/65%, REduced AT 8.2/40%, Normal breathing reservce of 55%, submaximal heart rate response 112/77%,  flattened O2 pulse response at peak exercise - 77m/beat at 85%. No VQ mismatch abnormalities. - ECHO 09/04/2008: LVH, ef 60%, mild AS and unchanged from prior   - CT chest   - Non specific Interstitial Lung disease NOS. Stable pulm infilratates RLL ggo > LLL ggo. 03/2008 -> 10/2008 -> June 2011: stable - Rehab: never attended 2009-2013 due to cost co.  2013 Walking desat test : resting 98/94% -> 3 laps x 185 feet; HR 113/pulse ox 96%    PFTs July 2013 show worsening restriction since 2010 along with reduced diffusion. The RLL baseline GGO might be worse in July 2013 compared to 2011. In addition, autommune profile shows VERY HIGH TITERS of CCP antibody > 300 which is pathognomonic of rheumtoid arthritis lung involvement. RF is only borderline elevated a 23  PFT 10/12/11>>FEV1 2.02 (70%), Fvc 2.8/72%, TLC 4.07 (59%), DLCO 62%, no BD: RESTRICTION - WORSE since 2010 -(2010:  Fev1 2.2L/73%, FVC 3.21L/70%, Ratio 68 (67),  TLC 4.7L/68%, RV 1.5L/55%, DLCO 79%)   Echo 05/04/11>>mild LVH, EF  55 to 656% grade 1 diastolic dysfx, mild AS, mild MR, PAS 34 mmHg  CT 11/02/11 - calcification suspicious for aortic valve and some GGO esp RLL > LLL (? RLL worse compared to 2011)   LFargo9/27/13: dLM 80%, LAD 100%, circumflex 100%, RCA 100%, proximal SVG-OM 30%, SVG-D1 ok, SVG-RCA 99% distal and 80% ostial PDA distal to graft, LIMA-LAD ok with some left to right collaterals.  PCI 01/03/12: Promus DES to the SVG-RCA and Cutting Balloon angioplasty to the ostial PDA.  Echocardiogram 12/31/11: EF 531-49% grade 1 diastolic dysfunction, mild aortic stenosis, mean gradient 9, mild LAE.   Walking desaturation test on 05/05/2012 185 feet x 3 laps:  did NOT desaturate. Rest pulse ox was 98%, final pulse ox was 96%. HR response 69/min at rest to 82/min at peak exertion.   PFT FVC fev1 ratio BD fev1 TLC DLCO comment intervention  2010 3.2L/70% 2.2L/73%   4.7L/68% 79%    10/12/11 2.8L/72% 2.02L/70% 72  4.07L/59% 62% Worsening restriction since 2010 Rheum will start immuran/pred. Also had PCI in nov 2013  04/28/12 2.6L/59% 1.77L/62% 67  4.0L/59% 15.7/71%     '2015 PFT showed no significant change with FEV1 56% , ratio 66,  FVC 61%.   07/31/2015 Follow up : ILD ? RA related  Pt returns for follow up . Has been have more DOE.  Was set up for  a CT chest  PFT on 4/25 showed FEV1 57%, ratio 71, FVC 58%, no sign BD response , + BD in midflows.  DLCO 40%. (this is decreased from 2015- was 59%.  TLC is similar to 2015 . Has been started on Imuran for RA ILD .  CT chest did not show any sgin change , cont Bilateral LL GGO and bronchiectasis .  Remains on O2 2l/m  At bedtime  . ONO showed desats on RA , O2 was continued.  Has been seen by [facility] last year. Now on Imuran, feels Arthritis pain is better.  Says he is feels he is doing okay, gets winded with walking long distances.  Can walk 1/2 block before stopping.  Has dry cough some days.  PVX and Prevnar 13 are utd.   Followed by Dr. Estil Daft in Rheum. Remains on  Imuran. Most pain is in his hands.   Denies chest pain, orthopnea , edema or fever.    OV 12/09/2015   Chief Complaint  Patient presents with  . Follow-up    Feels about the same since the last time we saw him, cough, with some mucus    Follow-up refractory dyspnea in the setting of mild interstitial lung disease from rheumatoid arthritis and coronary artery disease and multifactorial reasons. Refractory to pulmonary rehabilitation.  He did see my nurse practitioner April 2017 and CT chest did not show any changes to his bilateral lower lobe groundglass opacities and bronchiectasis. He is being maintained on Imuran for his rheumatoid arthritis. He is on oxygen 2 L at bedtime.     Had echocardiogram May 2017 that shows elevated pulmonary artery systolic pressure, mild aortic stenosis and diastolic dysfunction. On 08/13/2015 he underwent left heart catheterization by Dr. Sherren Mocha that shows severe native three-vessel coronary artery disease status post bypass with continued patency of the LIMA to LAD and continued patency of the saphenous vein graft to OM with severe stenosis in the proximal body of the graft for which he had successful PCI of a drug-eluting stent treatment of in-stent restenosis. Recommendation is dual antiplatelet therapy lifelong.   He is with his wife currently. He says dyspnea is only some better. He still has significant dyspnea and faitgues more than his wife.   OV 06/07/2016  Chief Complaint  Patient presents with  . Follow-up    Pt feels like his breathing has not been doing good, pt c/o coughing more with greyish colored mucus, more increase sob with exertion, has some chest tightnes, does has some congestion Denies fever   Follow-up refractory dyspnea in the setting of rheumatoid arthritis andand coronary artery disease. Multifactorial dyspnea. On Imuran for rheumatoid arthritis oxygen 2 L at bedtime. He is refractory to pulmonary rehabilitation. He is  believed to have some interstitial lung disease but April 2017 CT chest suggested only mild bronchiectasis  Last seen in September 2017 and at that time as dyspnea with some better after having a cardiac stent. I discussed with his cardiologist about the possibility of having right heart catheterization but it was felt that the elevation in pulmonary artery pressure was not significant enough on the echocardiogram to warrant this. He tells me that he is deteriorated now and this shortness of breath is even worse. He has tried Symbicort in the past without much help. He is willing to try "anything" to help his dyspnea. Walking desaturation test on 06/07/2016 185 feet x 3 laps on RA:  did NOT desaturate. Rest pulse ox was 97%,  final pulse ox was 95%. Did only 2 laps and stopped in interim x 2. . HR response 64/min at rest to 70/min at peak exertion. He says he stopped due to dyspnea and chest pain - like always       OV 08/03/2016  Chief Complaint  Patient presents with  . Follow-up    Pt here after HRCT. Pt states he tried the trelegy and states it helped his breathing. Pt denies cough, CP/tightness, and f/c/s.     Refractory dyspnea in this gentleman with cardiac disease and also mixed obstructive restrictive spirometry due to air trapping and bronchiectasis and some groundglass.  Last visit I tried him on a sample of triple inhaler therapy. He says that this only worked temporarily and stop working. Nevertheless he wants another inhaler. He is frustrated by his dyspnea. He and his wife are making for relief from dyspnea. In 2016 he visited [place] and they were considering switching his Imuran to CellCept in order to improve his pulmonary parenchymal problems. He is open to that. However he is not sure he needs follow-up with [facility] because Dr Deeann Dowse left the practice there. The suggestion of mild elevation in pulmonary artery systolic pressures and a previous echocardiogram but it  was not significant enough to warrant cardiology to do right heart catheterization. He is willing to have right heart catheterization if recommended. Overall frustrated. Pulm function test does not show any change in 2 years. And CT scan itself in 2018 appears to be stable   Results for RAWLINS, STUARD (MRN [id number]) as of 08/03/2016 12:17  Ref. Range 09/18/2013 11:28 07/29/2015 11:12  FEV1-Pre Latest Units: L 1.69 1.67  FEV1-%Pred-Pre Latest Units: % 56 52  Pre FEV1/FVC ratio Latest Units: % 66 66  Results for ROMEO, ZIELINSKI (MRN [id number]) as of 08/03/2016 12:17  Ref. Range 09/18/2013 11:28 07/29/2015 11:12  TLC Latest Units: L 4.70 4.81  TLC % pred Latest Units: % 64 62  Results for SAMARI, GORBY (MRN [id number]) as of 08/03/2016 12:17  Ref. Range 09/18/2013 11:28 07/29/2015 11:12  DLCO unc Latest Units: ml/min/mmHg 19.91 14.87  DLCO unc % pred Latest Units: % 59 40   IMPRESSION: 1. Stable mild-to-moderate cylindrical and varicoid bronchiectasis in the bilateral dependent lower lobes. 2. Continued stability of parenchymal banding with associated subpleural reticulation and ground-glass attenuation at the areas of bronchiectasis in the dependent and basilar lower lobes, most consistent with postinfectious/postinflammatory scarring. No findings to suggest interstitial lung disease. 3. Stable smooth pleural thickening and calcification in the dependent basilar right pleural space, most compatible with chronic postinflammatory change. No pleural effusions. 4. Stable mild patchy air trapping in both lungs, suggesting small airways disease. 5. Aortic atherosclerosis. Left main and 3 vessel coronary atherosclerosis status post CABG.    OV 01/31/2017  Chief Complaint  Patient presents with  . Follow-up    Pt states that he has been sick x5 months with nausea and vomiting. C/o SOB with exertion, prod. cough with gray mucus, and occ. CP when SOB.    83 year old male with rheumatoid  arthritis and interstitial lung disease. He has refractory dyspnea class III on exertion relieved by rest. He also has associated exertional chest pain.at last visit I had his rheumatologist Dr. D evaluate him in switching his Imuran to CellCeptwhich she did in 11/18/2016. He is on low-dose methotrexate was reduced GFR. He is tolerating the medication well. He complains of nausea and vomiting but this preceded the CellCept and  Bactrim by 2 months.he is scheduled for an endoscopy in November 2018. He does not change because of the CellCept and Bactrim.Most recent lab 12/26/2016 fairly stable except mild reduction in hemoglobin. Dr. Keturah Barre has referred him back to primary care physician.he continues to be overwhelmed by his dyspnea and also associated chest pain with exertion. Was recently saw cardiology and had a stable echocardiogram and EKG according to his history. Cardiologist reassured him in his view of the chest pain is not associated with cardiac disease. He therefore is insisting to me that this is because of lung disease. Walking desaturation test 185 feet 3 laps on room air with a forehead probe: Resting pulse ox 99%. Final pulse ox 98%. Resting heart rate 80/m. Final heart rate 98 a minute. He did get dyspneic with chest pain as always. This no change in his oxygen desaturation test.\  The only changes on the last 3 weeks is complaining of increased cough congestion in his chestand a grayish sputum that is different from from baseline.    has a past medical history of Arthritis; CAD (coronary artery disease); Chronic diastolic CHF (congestive heart failure) ([place]); Chronic kidney disease, stage III (moderate) ([place]); Chronic lower back pain; Colon polyps; Diverticulosis; Dyspnea (2009 since July -Sept); Enlarged prostate; Esophageal stricture; GERD (gastroesophageal reflux disease); Heart murmur; Hiatal hernia; History of PFTs; Hyperlipidemia; Hypertension; Interstitial lung disease ([place]); Overweight (BMI  25.0-29.9); RA (rheumatoid arthritis) ([place]); Seropositive rheumatoid arthritis ([place]); and Type II diabetes mellitus ([place]).   reports that he quit smoking about 53 years ago. His smoking use included Cigarettes. He has a 20.00 pack-year smoking history. He has never used smokeless tobacco.  Past Surgical History:  Procedure Laterality Date  . CARDIAC CATHETERIZATION  08/2004   CP- no MI, Cath- small vessell disease   . CARDIAC CATHETERIZATION  12/31/2011   80% distal LM, 100% native LAD, LCx and RCA, 30% prox SVG-OM, SVG-D1 normal, 99% distal, 80% ostial SVG-RCA distal to graft, LIMA-LAD normal; LVEF mildly decreased with posterior basal AK   . CARDIAC CATHETERIZATION  2009   with patent grafts/notes 12/31/2011  . CARDIAC CATHETERIZATION N/A 08/13/2015   Procedure: Left Heart Cath and Cors/Grafts Angiography;  Surgeon: Sherren Mocha, MD;  Location: [place] CV LAB;  Service: Cardiovascular;  Laterality: N/A;  . CATARACT EXTRACTION W/ INTRAOCULAR LENS  IMPLANT, BILATERAL Bilateral   . CHOLECYSTECTOMY OPEN  11/2003   [name]  . CORONARY ANGIOPLASTY WITH STENT PLACEMENT  01/03/2012   Successful DES to SVG-RCA and cutting balloon angioplasty ostial  PDA   . CORONARY ANGIOPLASTY WITH STENT PLACEMENT  01/14/2014   "1"  . CORONARY ARTERY BYPASS GRAFT  11/1999   CABG X5  . CORONARY STENT PLACEMENT  02/2012   1 stent and balloon  . ESOPHAGOGASTRODUODENOSCOPY (EGD) WITH ESOPHAGEAL DILATION  2010  . HAND SURGERY    . JOINT REPLACEMENT    . KNEE ARTHROSCOPY Right 2008  . LEFT AND RIGHT HEART CATHETERIZATION WITH CORONARY ANGIOGRAM  12/31/2011   Procedure: LEFT AND RIGHT HEART CATHETERIZATION WITH CORONARY ANGIOGRAM;  Surgeon: Burnell Blanks, MD;  Location: [facility] CATH LAB;  Service: Cardiovascular;;  . LEFT AND RIGHT HEART CATHETERIZATION WITH CORONARY ANGIOGRAM N/A 01/14/2014   Procedure: LEFT AND RIGHT HEART CATHETERIZATION WITH CORONARY ANGIOGRAM;  Surgeon: Peter M Martinique, MD;  Location: [facility]  CATH LAB;  Service: Cardiovascular;  Laterality: N/A;  . PERCUTANEOUS CORONARY INTERVENTION-BALLOON ONLY  01/03/2012   Procedure: PERCUTANEOUS CORONARY INTERVENTION-BALLOON ONLY;  Surgeon: Peter M Martinique, MD;  Location: [place] CATH LAB;  Service: Cardiovascular;;  . PERCUTANEOUS CORONARY STENT INTERVENTION (PCI-S)  12/31/2011   Procedure: PERCUTANEOUS CORONARY STENT INTERVENTION (PCI-S);  Surgeon: Burnell Blanks, MD;  Location: [facility] CATH LAB;  Service: Cardiovascular;;  . PERCUTANEOUS CORONARY STENT INTERVENTION (PCI-S) N/A 01/03/2012   Procedure: PERCUTANEOUS CORONARY STENT INTERVENTION (PCI-S);  Surgeon: Peter M Martinique, MD;  Location: [facility] CATH LAB;  Service: Cardiovascular;  Laterality: N/A;  . SHOULDER ARTHROSCOPY WITH OPEN ROTATOR CUFF REPAIR AND DISTAL CLAVICLE ACROMINECTOMY Left 02/27/2013   Procedure: LEFT SHOULDER ARTHROSCOPY WITH MINI OPEN ROTATOR CUFF REPAIR AND SUBACROMIAL DECOMPRESSION AND DISTAL CLAVICLE RESECTION;  Surgeon: [name], MD;  Location: [place];  Service: Orthopedics;  Laterality: Left;  . TOTAL KNEE ARTHROPLASTY Right 03/2010   [name]  . TRIGGER FINGER RELEASE Left 02/27/2013   Procedure: RELEASE TRIGGER FINGER/A-1 PULLEY;  Surgeon: [name], MD;  Location: [place];  Service: Orthopedics;  Laterality: Left;    Allergies  Allergen Reactions  . Doxazosin Mesylate Other (See Comments)    dizziness  . Methocarbamol Rash    Immunization History  Administered Date(s) Administered  . H1N1 03/27/2008  . Influenza Split 12/29/2010, 01/18/2012  . Influenza Whole 02/03/2007, 01/09/2008, 01/01/2009, 12/31/2009  . Influenza,inj,Quad PF,6+ Mos 12/13/2012, 01/15/2014, 01/15/2015, 01/06/2016, 01/03/2017  . Pneumococcal Conjugate-13 09/10/2013  . Pneumococcal Polysaccharide-23 03/05/1997, 08/17/2011  . Td 03/05/1997, 08/17/2011    Family History  Problem Relation Age of Onset  . COPD Mother   . Heart disease Father   . Heart attack Father   . Diabetes  Brother   . Colon cancer Brother 25  . Alcohol abuse Sister   . Stroke Sister      Current Outpatient Prescriptions:  .  aspirin EC 81 MG tablet, Take 81 mg by mouth at bedtime., Disp: , Rfl:  .  atorvastatin (LIPITOR) 80 MG tablet, TAKE 1 TABLET BY MOUTH DAILY AT 6PM., Disp: 30 tablet, Rfl: 10 .  Blood Glucose Monitoring Suppl (ONE TOUCH ULTRA 2) w/Device KIT, Use to obtain blood sugar daily. Dx Code E11.40, Disp: 1 each, Rfl: 0 .  carvedilol (COREG) 3.125 MG tablet, Take 1 tablet (3.125 mg total) by mouth 2 (two) times daily with a meal., Disp: 180 tablet, Rfl: 3 .  clopidogrel (PLAVIX) 75 MG tablet, TAKE 1 TABLET BY MOUTH DAILY WITH BREAKFAST., Disp: 30 tablet, Rfl: 10 .  DHA-Vitamin C-Lutein (EYE HEALTH FORMULA PO), Take 1 tablet by mouth daily. , Disp: , Rfl:  .  furosemide (LASIX) 40 MG tablet, TAKE 1 OR 2 TABLETS BY MOUTH DAILY, Disp: 60 tablet, Rfl: 10 .  glucose blood (ONE TOUCH ULTRA TEST) test strip, USE TO CHECK BLOOD SUGAR ONCE A DAY Dx Code E11.40, Disp: 100 each, Rfl: 3 .  metFORMIN (GLUCOPHAGE) 1000 MG tablet, Take 1 tablet (1,000 mg total) by mouth daily with breakfast., Disp: 30 tablet, Rfl: 3 .  Multiple Vitamin (MULTIVITAMIN WITH MINERALS) TABS tablet, Take 1 tablet by mouth daily., Disp: , Rfl:  .  mycophenolate (CELLCEPT) 500 MG tablet, 1bid, Disp: 180 tablet, Rfl: 0 .  nitroGLYCERIN (NITROSTAT) 0.4 MG SL tablet, DISSOLVE 1 TABLET UNDER THE TONGUE FOR CHEST PAIN. MAY REPEAT EVERY 5MINUTES UP TO 3 DOSES. IF NO RELIEF, CALL 911**, Disp: 25 tablet, Rfl: 5 .  ondansetron (ZOFRAN) 4 MG tablet, TAKE ONE TABLET BY MOUTH EVERY 8 HOURS AS NEEDED FOR NAUSEA OR VOMITING, Disp: 60 tablet, Rfl: 1 .  ONETOUCH DELICA LANCETS 15V MISC, , Disp: , Rfl:  .  OXYGEN, Inhale into the lungs. Per pt- Uses Oxygen 2 liters at night., Disp: , Rfl:  .  pantoprazole (PROTONIX) 40 MG tablet, Take 1 tablet (40 mg total) by mouth 2 (two) times daily., Disp: 60 tablet, Rfl: 3 .   sulfamethoxazole-trimethoprim (BACTRIM DS,SEPTRA DS) 800-160 MG tablet, TAKE HALF A TABLET BY MOUTH 3 TIMES A WEEK., Disp: 6 tablet, Rfl: 2   Review of Systems     Objective:   Physical Exam  Vitals:   01/31/17 1056  BP: 128/70  Pulse: 83  SpO2: 96%  Weight: 204 lb 6.4 oz (92.7 kg)  Height: '6\' 1"'$  (1.854 m)    Estimated body mass index is 26.97 kg/m as calculated from the following:   Height as of this encounter: '6\' 1"'$  (1.854 m).   Weight as of this encounter: 204 lb 6.4 oz (92.7 kg).      Assessment:       ICD-10-CM   1. ILD (interstitial lung disease) ([place]) J84.9   2. Dyspnea, unspecified type R06.00   3. Exertional chest pain R07.9   4. Nausea R11.0        Plan:      ILD (interstitial lung disease) ([place]) Dyspnea, unspecified type  - clinically stable  -uanble to fix your shortness of breath  - continue the cellcept and bactrim to prevent ILD from getting worse - labs for cellcept per Dr Estanislado Pandy  Exertional chest pain  - not related to lung  Nausea - this started  2 months before cellcept - please talk to PCP [name], MD about this - ok for endoscopy 03/01/17   Acute bronchitis Please take prednisone 40 mg x1 day, then 30 mg x1 day, then 20 mg x1 day, then 10 mg x1 day, and then 5 mg x1 day and stop  cephalexin '500mg'$  three times daily x  5 days     Follow-up 3 months or sooner if needed  > 50% of this > 25 min visit spent in face to face counseling or coordination of care    Dr. Brand Males, M.D., [facility].C.P Pulmonary and Critical Care Medicine Staff Physician [place] [facility] Pager: [phone], If no answer or between  15:00h - 7:00h: call [phone]  01/31/2017 11:19 AM

## 2017-01-31 NOTE — Telephone Encounter (Signed)
Amy at [facility] is trying to help pt with his meds so pt can take his med correctly. Pt seen 01/03/17 and at beginning of visit pt taking carvedilol 6.25 mg one tab bid and med was changed at visit to carvedilol 3.125 mg bid. Amy voiced understanding and will make sure pt is taking med correctly.

## 2017-01-31 NOTE — Addendum Note (Signed)
Addended by: Lorretta Harp on: 01/31/2017 11:35 AM   Modules accepted: Orders

## 2017-01-31 NOTE — Patient Instructions (Addendum)
ILD (interstitial lung disease) ([name]) Dyspnea, unspecified type  - clinically stable  -uanble to fix your shortness of breath  - continue the cellcept and bactrim to prevent ILD from getting worse - labs for cellcept per Dr Estanislado Pandy  Exertional chest pain  - not related to lung  Nausea - this started  2 months before cellcept - please talk to PCP [name], MD about this   Acute bronchitis Please take prednisone 40 mg x1 day, then 30 mg x1 day, then 20 mg x1 day, then 10 mg x1 day, and then 5 mg x1 day and stop  cephalexin 500mg  three times daily x  5 days     Follow-up 3 months or sooner if needed

## 2017-02-07 ENCOUNTER — Other Ambulatory Visit: Payer: Self-pay | Admitting: Rheumatology

## 2017-02-07 NOTE — Telephone Encounter (Signed)
Should follow-up with PCP regarding anemia .

## 2017-02-07 NOTE — Telephone Encounter (Signed)
Last Visit: 11/26/16 Next Visit: 04/25/17 Labs: 01/25/17 Anemia is worse. He should follow up with PCP regarding anemia.  Okay to refill Cellcept?

## 2017-02-10 ENCOUNTER — Telehealth: Payer: Self-pay | Admitting: Neurology

## 2017-02-10 NOTE — Telephone Encounter (Signed)
Called [facility] and left message for someone to call me back about the referral.

## 2017-02-10 NOTE — Telephone Encounter (Signed)
He has not heard anything from pain management and that his feet are still hurting. He would like you to please call his Cell [phone]. Thanks

## 2017-02-11 ENCOUNTER — Other Ambulatory Visit: Payer: Self-pay | Admitting: *Deleted

## 2017-02-11 DIAGNOSIS — E0842 Diabetes mellitus due to underlying condition with diabetic polyneuropathy: Secondary | ICD-10-CM

## 2017-02-11 DIAGNOSIS — M0550 Rheumatoid polyneuropathy with rheumatoid arthritis of unspecified site: Secondary | ICD-10-CM

## 2017-02-11 NOTE — Telephone Encounter (Signed)
Called patient back.  No answer and no voicemail.

## 2017-02-11 NOTE — Telephone Encounter (Signed)
Another referral sent to [facility] pain clinic.

## 2017-02-14 ENCOUNTER — Other Ambulatory Visit: Payer: Self-pay | Admitting: *Deleted

## 2017-02-14 DIAGNOSIS — M48061 Spinal stenosis, lumbar region without neurogenic claudication: Secondary | ICD-10-CM

## 2017-02-14 NOTE — Progress Notes (Unsigned)
amb  

## 2017-02-14 NOTE — Telephone Encounter (Signed)
Another referral sent to [facility].

## 2017-02-15 DIAGNOSIS — J8410 Pulmonary fibrosis, unspecified: Secondary | ICD-10-CM | POA: Diagnosis not present

## 2017-02-15 DIAGNOSIS — R0689 Other abnormalities of breathing: Secondary | ICD-10-CM | POA: Diagnosis not present

## 2017-02-16 DIAGNOSIS — H43813 Vitreous degeneration, bilateral: Secondary | ICD-10-CM | POA: Diagnosis not present

## 2017-02-16 DIAGNOSIS — H35372 Puckering of macula, left eye: Secondary | ICD-10-CM | POA: Diagnosis not present

## 2017-02-16 DIAGNOSIS — H353221 Exudative age-related macular degeneration, left eye, with active choroidal neovascularization: Secondary | ICD-10-CM | POA: Diagnosis not present

## 2017-02-16 DIAGNOSIS — H353213 Exudative age-related macular degeneration, right eye, with inactive scar: Secondary | ICD-10-CM | POA: Diagnosis not present

## 2017-02-17 ENCOUNTER — Other Ambulatory Visit: Payer: Self-pay

## 2017-02-17 ENCOUNTER — Encounter (HOSPITAL_COMMUNITY): Payer: Self-pay | Admitting: *Deleted

## 2017-02-17 NOTE — Telephone Encounter (Signed)
Patient notified that I have sent more referrals.  Called [facility] and they do have the referral and will call patient within a couple of weeks to schedule.

## 2017-03-01 ENCOUNTER — Encounter (HOSPITAL_COMMUNITY): Payer: Self-pay | Admitting: Emergency Medicine

## 2017-03-01 ENCOUNTER — Ambulatory Visit (HOSPITAL_COMMUNITY): Payer: Medicare Other | Admitting: Anesthesiology

## 2017-03-01 ENCOUNTER — Ambulatory Visit (HOSPITAL_COMMUNITY)
Admission: RE | Admit: 2017-03-01 | Discharge: 2017-03-01 | Disposition: A | Discharge status: Home / Self Care | Payer: Medicare Other | Source: Ambulatory Visit | Attending: Internal Medicine | Admitting: Internal Medicine

## 2017-03-01 ENCOUNTER — Encounter (HOSPITAL_COMMUNITY)
Admission: RE | Disposition: A | Discharge status: Home / Self Care | Payer: Self-pay | Source: Ambulatory Visit | Attending: Internal Medicine

## 2017-03-01 ENCOUNTER — Other Ambulatory Visit: Payer: Self-pay

## 2017-03-01 DIAGNOSIS — Z7982 Long term (current) use of aspirin: Secondary | ICD-10-CM | POA: Insufficient documentation

## 2017-03-01 DIAGNOSIS — N400 Enlarged prostate without lower urinary tract symptoms: Secondary | ICD-10-CM | POA: Diagnosis not present

## 2017-03-01 DIAGNOSIS — G4733 Obstructive sleep apnea (adult) (pediatric): Secondary | ICD-10-CM | POA: Diagnosis not present

## 2017-03-01 DIAGNOSIS — N183 Chronic kidney disease, stage 3 (moderate): Secondary | ICD-10-CM | POA: Diagnosis not present

## 2017-03-01 DIAGNOSIS — I2510 Atherosclerotic heart disease of native coronary artery without angina pectoris: Secondary | ICD-10-CM | POA: Diagnosis not present

## 2017-03-01 DIAGNOSIS — R933 Abnormal findings on diagnostic imaging of other parts of digestive tract: Secondary | ICD-10-CM | POA: Diagnosis not present

## 2017-03-01 DIAGNOSIS — J849 Interstitial pulmonary disease, unspecified: Secondary | ICD-10-CM | POA: Insufficient documentation

## 2017-03-01 DIAGNOSIS — Z955 Presence of coronary angioplasty implant and graft: Secondary | ICD-10-CM | POA: Insufficient documentation

## 2017-03-01 DIAGNOSIS — K222 Esophageal obstruction: Secondary | ICD-10-CM | POA: Diagnosis not present

## 2017-03-01 DIAGNOSIS — E785 Hyperlipidemia, unspecified: Secondary | ICD-10-CM | POA: Insufficient documentation

## 2017-03-01 DIAGNOSIS — I252 Old myocardial infarction: Secondary | ICD-10-CM | POA: Insufficient documentation

## 2017-03-01 DIAGNOSIS — I130 Hypertensive heart and chronic kidney disease with heart failure and stage 1 through stage 4 chronic kidney disease, or unspecified chronic kidney disease: Secondary | ICD-10-CM | POA: Diagnosis not present

## 2017-03-01 DIAGNOSIS — Z87891 Personal history of nicotine dependence: Secondary | ICD-10-CM | POA: Insufficient documentation

## 2017-03-01 DIAGNOSIS — E1122 Type 2 diabetes mellitus with diabetic chronic kidney disease: Secondary | ICD-10-CM | POA: Diagnosis not present

## 2017-03-01 DIAGNOSIS — K219 Gastro-esophageal reflux disease without esophagitis: Secondary | ICD-10-CM | POA: Diagnosis not present

## 2017-03-01 DIAGNOSIS — Z7984 Long term (current) use of oral hypoglycemic drugs: Secondary | ICD-10-CM | POA: Diagnosis not present

## 2017-03-01 DIAGNOSIS — K224 Dyskinesia of esophagus: Secondary | ICD-10-CM | POA: Insufficient documentation

## 2017-03-01 DIAGNOSIS — M069 Rheumatoid arthritis, unspecified: Secondary | ICD-10-CM | POA: Insufficient documentation

## 2017-03-01 DIAGNOSIS — Z79899 Other long term (current) drug therapy: Secondary | ICD-10-CM | POA: Diagnosis not present

## 2017-03-01 DIAGNOSIS — R1310 Dysphagia, unspecified: Secondary | ICD-10-CM

## 2017-03-01 DIAGNOSIS — Z951 Presence of aortocoronary bypass graft: Secondary | ICD-10-CM | POA: Diagnosis not present

## 2017-03-01 DIAGNOSIS — I5032 Chronic diastolic (congestive) heart failure: Secondary | ICD-10-CM | POA: Insufficient documentation

## 2017-03-01 HISTORY — PX: MALONEY DILATION: SHX5535

## 2017-03-01 HISTORY — DX: Personal history of urinary calculi: Z87.442

## 2017-03-01 HISTORY — PX: ESOPHAGOGASTRODUODENOSCOPY: SHX5428

## 2017-03-01 SURGERY — DILATION, ESOPHAGUS, USING MALONEY DILATOR
Anesthesia: Monitor Anesthesia Care

## 2017-03-01 MED ORDER — PROPOFOL 10 MG/ML IV BOLUS
INTRAVENOUS | Status: AC
  Filled 2017-03-01: qty 40

## 2017-03-01 MED ORDER — SODIUM CHLORIDE 0.9 % IV SOLN: INTRAVENOUS | Status: DC

## 2017-03-01 MED ORDER — LACTATED RINGERS IV SOLN
INTRAVENOUS | Status: DC | PRN
  Administered 2017-03-01: 10:00:00 via INTRAVENOUS

## 2017-03-01 MED ORDER — PROPOFOL 10 MG/ML IV BOLUS
INTRAVENOUS | Status: DC | PRN
  Administered 2017-03-01 (×2): 10 mg via INTRAVENOUS
  Administered 2017-03-01 (×2): 20 mg via INTRAVENOUS
  Administered 2017-03-01: 10 mg via INTRAVENOUS
  Administered 2017-03-01: 30 mg via INTRAVENOUS

## 2017-03-01 MED ORDER — SODIUM CHLORIDE 0.9 % IV SOLN: 50.0000 mL | INTRAVENOUS | 0 refills | Status: DC

## 2017-03-01 NOTE — Anesthesia Preprocedure Evaluation (Signed)
Anesthesia Evaluation  Patient identified by MRN, date of birth, ID band Patient awake    Reviewed: Allergy & Precautions, NPO status , Patient's Chart, lab work & pertinent test results, reviewed documented beta blocker date and time   Airway Mallampati: II  TM Distance: >3 FB Neck ROM: Full    Dental  (+) Dental Advisory Given, Chipped, Partial Upper, Poor Dentition, Missing   Pulmonary sleep apnea , former smoker,    Pulmonary exam normal breath sounds clear to auscultation       Cardiovascular hypertension, Pt. on medications and Pt. on home beta blockers + angina + CAD, + Cardiac Stents, + CABG, + Peripheral Vascular Disease and +CHF  Normal cardiovascular exam Rhythm:Regular Rate:Normal     Neuro/Psych negative neurological ROS  negative psych ROS   GI/Hepatic Neg liver ROS, hiatal hernia, GERD  Medicated,  Endo/Other  diabetes, Type 2, Oral Hypoglycemic Agents  Renal/GU Renal InsufficiencyRenal diseasenegative Renal ROS     Musculoskeletal  (+) Arthritis , Rheumatoid disorders,    Abdominal   Peds  Hematology  (+) Blood dyscrasia (Plavix), ,   Anesthesia Other Findings Day of surgery medications reviewed with the patient.  Reproductive/Obstetrics                             Anesthesia Physical Anesthesia Plan  ASA: III  Anesthesia Plan: MAC   Post-op Pain Management:    Induction: Intravenous  PONV Risk Score and Plan: 1 and Propofol infusion and Ondansetron  Airway Management Planned: Nasal Cannula  Additional Equipment:   Intra-op Plan:   Post-operative Plan:   Informed Consent: I have reviewed the patients History and Physical, chart, labs and discussed the procedure including the risks, benefits and alternatives for the proposed anesthesia with the patient or authorized representative who has indicated his/her understanding and acceptance.   Dental advisory  given  Plan Discussed with: CRNA and Anesthesiologist  Anesthesia Plan Comments: (Discussed risks/benefits/alternatives to MAC sedation including need for ventilatory support, hypotension, need for conversion to general anesthesia.  All patient questions answered.  Patient/guardian wishes to proceed.)        Anesthesia Quick Evaluation

## 2017-03-01 NOTE — Op Note (Signed)
[facility] Patient Name: [name] Procedure Date: 03/01/2017 MRN: [id number] Attending MD: Docia Chuck. Henrene Pastor , MD Date of Birth: 28-Jul-1931 CSN: [id number] Age: 83 Admit Type: Outpatient Procedure:                Upper GI endoscopy, with Venia Minks dilation of the                            esophagus Indications:              Dysphagia, Abnormal cine-esophagram Providers:                Docia Chuck. Henrene Pastor, MD, Laverta Baltimore RN, RN, Tinnie Gens, Technician, Otoe Alday CRNA, CRNA Referring MD:              Medicines:                Monitored Anesthesia Care Complications:            No immediate complications. Estimated Blood Loss:     Estimated blood loss: none. Procedure:                Pre-Anesthesia Assessment:                           - Prior to the procedure, a History and Physical                            was performed, and patient medications and                            allergies were reviewed. The patient's tolerance of                            previous anesthesia was also reviewed. The risks                            and benefits of the procedure and the sedation                            options and risks were discussed with the patient.                            All questions were answered, and informed consent                            was obtained. Prior Anticoagulants: The patient has                            taken Plavix (clopidogrel), last dose was 5 days                            prior to procedure. ASA Grade Assessment: III - A  patient with severe systemic disease. After                            reviewing the risks and benefits, the patient was                            deemed in satisfactory condition to undergo the                            procedure.                           After obtaining informed consent, the endoscope was                            passed under direct  vision. Throughout the                            procedure, the patient's blood pressure, pulse, and                            oxygen saturations were monitored continuously. The                            Endoscope was introduced through the mouth, and                            advanced to the second part of duodenum. The upper                            GI endoscopy was accomplished without difficulty.                            The patient tolerated the procedure well. Scope In: Scope Out: Findings:      One mild benign-appearing, intrinsic stenosis was found 40 cm from the       incisors. This measured 1.5 cm (inner diameter). The scope was       withdrawn. Dilation was performed with a Maloney dilator with no       resistance at 54 Fr.      The exam of the esophagus was otherwise normal.      The stomach was normal.      The examined duodenum was normal.      The cardia and gastric fundus were normal on retroflexion. Impression:               - Benign-appearing esophageal stenosis. Dilated.                           - Normal stomach.                           - Normal examined duodenum.                           - No specimens collected. Moderate Sedation:      none Recommendation:  1. Patient has a contact number available for                            emergencies. The signs and symptoms of potential                            delayed complications were discussed with the                            patient. Return to normal activities tomorrow.                            Written discharge instructions were provided to the                            patient.                           2. Post dilation diet                           3. Resume Plavix today                           4. Chew food well                           5. Office follow-up with [name] in about 3 months                           6. Consider manometry if no benefit from dilation                            7. Continue present medications. Procedure Code(s):        --- Professional ---                           [phone], Esophagogastroduodenoscopy, flexible,                            transoral; diagnostic, including collection of                            specimen(s) by brushing or washing, when performed                            (separate procedure)                           43450, Dilation of esophagus, by unguided sound or                            bougie, single or multiple passes Diagnosis Code(s):        --- Professional ---  K22.2, Esophageal obstruction                           R13.10, Dysphagia, unspecified                           R93.3, Abnormal findings on diagnostic imaging of                            other parts of digestive tract CPT copyright 2016 American Medical Association. All rights reserved. The codes documented in this report are preliminary and upon coder review may  be revised to meet current compliance requirements. Docia Chuck. Henrene Pastor, MD 03/01/2017 11:08:20 AM This report has been signed electronically. Number of Addenda: 0

## 2017-03-01 NOTE — Discharge Instructions (Signed)
YOU HAD AN ENDOSCOPIC PROCEDURE TODAY: Refer to the procedure report and other information in the discharge instructions given to you for any specific questions about what was found during the examination. If this information does not answer your questions, please call [place] office at [phone] to clarify.  ° °YOU SHOULD EXPECT: Some feelings of bloating in the abdomen. Passage of more gas than usual. Walking can help get rid of the air that was put into your GI tract during the procedure and reduce the bloating. If you had a lower endoscopy (such as a colonoscopy or flexible sigmoidoscopy) you may notice spotting of blood in your stool or on the toilet paper. Some abdominal soreness may be present for a day or two, also. ° °DIET: Your first meal following the procedure should be a light meal and then it is ok to progress to your normal diet. A half-sandwich or bowl of soup is an example of a good first meal. Heavy or fried foods are harder to digest and may make you feel nauseous or bloated. Drink plenty of fluids but you should avoid alcoholic beverages for 24 hours. If you had a esophageal dilation, please see attached instructions for diet.   ° °ACTIVITY: Your care partner should take you home directly after the procedure. You should plan to take it easy, moving slowly for the rest of the day. You can resume normal activity the day after the procedure however YOU SHOULD NOT DRIVE, use power tools, machinery or perform tasks that involve climbing or major physical exertion for 24 hours (because of the sedation medicines used during the test).  ° °SYMPTOMS TO REPORT IMMEDIATELY: °A gastroenterologist can be reached at any hour. Please call [phone]  for any of the following symptoms:  °Following lower endoscopy (colonoscopy, flexible sigmoidoscopy) °Excessive amounts of blood in the stool  °Significant tenderness, worsening of abdominal pains  °Swelling of the abdomen that is new, acute  °Fever of 100° or  higher  °Following upper endoscopy (EGD, EUS, ERCP, esophageal dilation) °Vomiting of blood or coffee ground material  °New, significant abdominal pain  °New, significant chest pain or pain under the shoulder blades  °Painful or persistently difficult swallowing  °New shortness of breath  °Black, tarry-looking or red, bloody stools ° °FOLLOW UP:  °If any biopsies were taken you will be contacted by phone or by letter within the next 1-3 weeks. Call [phone]  if you have not heard about the biopsies in 3 weeks.  °Please also call with any specific questions about appointments or follow up tests. ° °

## 2017-03-01 NOTE — Transfer of Care (Signed)
Immediate Anesthesia Transfer of Care Note  Patient: [name]  Procedure(s) Performed: ESOPHAGOGASTRODUODENOSCOPY (EGD) (N/A ) MALONEY DILATION  Patient Location: PACU  Anesthesia Type:MAC  Level of Consciousness: sedated  Airway & Oxygen Therapy: Patient Spontanous Breathing and Patient connected to nasal cannula oxygen  Post-op Assessment: Report given to RN and Post -op Vital signs reviewed and stable  Post vital signs: Reviewed and stable  Last Vitals:  Vitals:   03/01/17 0955  BP: (!) 190/88  Pulse: 70  Resp: (!) 25  Temp: 36.8 C  SpO2: 97%    Last Pain:  Vitals:   03/01/17 0955  TempSrc: Oral         Complications: No apparent anesthesia complications

## 2017-03-01 NOTE — H&P (Signed)
Chief Complaint: Dysphagia  HPI:  [name] is an 83 year old Caucasian male with a past medical history of multiple significant medical problems as listed below including being maintained on Plavix for CAD and chronic diastolic CHF, who was referred to me by [name], MD for a complaint of dysphagia.   Patient has previously followed with [name] and was last seen in clinic 07/16/14. He is known to have a history of GERD and underwent dilation of esophagus June 2010 for peptic stricture. This was helpful after that point. He was seen at that visit for intermittent solid and liquid dysphagia. His PPI had already been changed from Pantoprazole 40 mg daily to Omeprazole 40 mg twice daily. He had a diagnostic esophagram 05/2514 which showed esophageal dysmotility without tertiary contractions. Moderate reflux was present however there was no mucosal or structural abnormality and a 13 mm tablet passed easily through the GE junction. At that visit, the patient had reported that his swallowing had improved.   Today, the patient tells me that he had been doing fairly well since time of last visit to our clinic but now continues with an increasing amount of intermittent dysphagia to both solids and liquids. He does continue on his Pantoprazole 40 mg twice a daily and denies any overt symptoms of heartburn or reflux. He does tell me that anything that eats or drinks can get stuck on it's way down and often he has to regurgitate it. This tends to be worse if he is talking while eating or does not chew well. Patient tells me it is to the point where he would like something checked out.   Also, patient describes a history of nausea for the past 6-9 weeks. Apparently he has been following with his primary care provider regarding this and they have tried to alter various medications to help him with this, but nothing has helped yet. They recently started him on Bactrim 1 tablet by mouth 3 times a week, he is not  really sure why. He also uses Zofran 4 mg when necessary for nausea. Which does help.   Patient denies fever, chills, blood in his stool, melena, change in bowel habits, weight loss, fatigue, anorexia, vomiting, heartburn, reflux or abdominal pain.      Past Medical History:  Diagnosis Date  . Arthritis    osteoarthritis, s/p R TKR, and digits  . CAD (coronary artery disease)    a. s/p CABG (2001)  b. s/p DES to RCA and cutting POBA to ostial PDA (2013)   c. s/p DES to SVG to OM2 (01/14/14) d. cath: 08/2015 NSTEMI w/ patent LIMA-LAD and 99% stenosis of SVG-OM w/ DES placed. CTO of SVG-RCA and SVG-D1.   [name] Chronic diastolic CHF (congestive heart failure) ([place])    a) 09/13 ECHO- LVEF 38-25%, grade 1 diastolic dysfunction, mild LA dilatation, atrial septal aneurysm, AV mobility restricted, but no sig AS by doppler; b) 09/04/08 ECHO- LVH, ef 60%, mild AS, c. echo 08/2015: EF perserved of 55-60% with inferolateral HK. Mild AS noted.  . Chronic kidney disease, stage III (moderate)   . Chronic lower back pain   . Colon polyps   . Diverticulosis   . Dyspnea 2009 since July -Sept   05/06/08-CPST-  normal effort, reduced VO2 max 20.5 /65%, reduced at 8.2/ 40%, normal breathing resetvca of 55%, submaximal heart rate response 112/77%, flattened o2 pluse response at peak exercise-12 ml/beat @ 85%, No VQ mismatch abnormalities, All c/w CIRC Limitation  . Enlarged  prostate   . Esophageal stricture    a. s/p dilation spring 2010  . GERD (gastroesophageal reflux disease)   . Heart murmur   . Hiatal hernia   . History of PFTs    mixed pattern on spiro. mild restn on lung volumes with near normal DLCO. Pattern can be explained by CABG scar. Fev1 2.2L/73%, ratio 68 (67), TLC 4.7/68%,RV 1.5L/55%,DLCO 79%  . Hyperlipidemia   . Hypertension   . Interstitial lung disease ([name])    NOS  . Overweight (BMI 25.0-29.9)    BMI 29  . RA (rheumatoid arthritis) ([name])    [name]  .  Seropositive rheumatoid arthritis ([place])   . Type II diabetes mellitus ([place])          Past Surgical History:  Procedure Laterality Date  . CARDIAC CATHETERIZATION  08/2004   CP- no MI, Cath- small vessell disease   . CARDIAC CATHETERIZATION  12/31/2011   80% distal LM, 100% native LAD, LCx and RCA, 30% prox SVG-OM, SVG-D1 normal, 99% distal, 80% ostial SVG-RCA distal to graft, LIMA-LAD normal; LVEF mildly decreased with posterior basal AK   . CARDIAC CATHETERIZATION  2009   with patent grafts/notes 12/31/2011  . CARDIAC CATHETERIZATION N/A 08/13/2015   Procedure: Left Heart Cath and Cors/Grafts Angiography;  Surgeon: Sherren Mocha, MD;  Location: [facility];  Service: Cardiovascular;  Laterality: N/A;  . CATARACT EXTRACTION W/ INTRAOCULAR LENS  IMPLANT, BILATERAL Bilateral   . CHOLECYSTECTOMY OPEN  11/2003   [name]  . CORONARY ANGIOPLASTY WITH STENT PLACEMENT  01/03/2012   Successful DES to SVG-RCA and cutting balloon angioplasty ostial  PDA   . CORONARY ANGIOPLASTY WITH STENT PLACEMENT  01/14/2014   "1"  . CORONARY ARTERY BYPASS GRAFT  11/1999   CABG X5  . CORONARY STENT PLACEMENT  02/2012   1 stent and balloon  . ESOPHAGOGASTRODUODENOSCOPY (EGD) WITH ESOPHAGEAL DILATION  2010  . HAND SURGERY    . JOINT REPLACEMENT    . KNEE ARTHROSCOPY Right 2008  . LEFT AND RIGHT HEART CATHETERIZATION WITH CORONARY ANGIOGRAM  12/31/2011   Procedure: LEFT AND RIGHT HEART CATHETERIZATION WITH CORONARY ANGIOGRAM;  Surgeon: Burnell Blanks, MD;  Location: [facility] C[facility] LAB;  Service: Cardiovascular;;  . LEFT AND RIGHT HEART CATHETERIZATION WITH CORONARY ANGIOGRAM N/A 01/14/2014   Procedure: LEFT AND RIGHT HEART CATHETERIZATION WITH CORONARY ANGIOGRAM;  Surgeon: Peter M Martinique, MD;  Location: [facility] CATH LAB;  Service: Cardiovascular;  Laterality: N/A;  . PERCUTANEOUS CORONARY INTERVENTION-BALLOON ONLY  01/03/2012   Procedure: PERCUTANEOUS CORONARY INTERVENTION-BALLOON  ONLY;  Surgeon: Peter M Martinique, MD;  Location: [facility] CATH LAB;  Service: Cardiovascular;;  . PERCUTANEOUS CORONARY STENT INTERVENTION (PCI-S)  12/31/2011   Procedure: PERCUTANEOUS CORONARY STENT INTERVENTION (PCI-S);  Surgeon: Burnell Blanks, MD;  Location: [facility] CATH LAB;  Service: Cardiovascular;;  . PERCUTANEOUS CORONARY STENT INTERVENTION (PCI-S) N/A 01/03/2012   Procedure: PERCUTANEOUS CORONARY STENT INTERVENTION (PCI-S);  Surgeon: Peter M Martinique, MD;  Location: [facility] CATH LAB;  Service: Cardiovascular;  Laterality: N/A;  . SHOULDER ARTHROSCOPY WITH OPEN ROTATOR CUFF REPAIR AND DISTAL CLAVICLE ACROMINECTOMY Left 02/27/2013   Procedure: LEFT SHOULDER ARTHROSCOPY WITH MINI OPEN ROTATOR CUFF REPAIR AND SUBACROMIAL DECOMPRESSION AND DISTAL CLAVICLE RESECTION;  Surgeon: [name], MD;  Location: [place];  Service: Orthopedics;  Laterality: Left;  . TOTAL KNEE ARTHROPLASTY Right 03/2010   [name]  . TRIGGER FINGER RELEASE Left 02/27/2013   Procedure: RELEASE TRIGGER FINGER/A-1 PULLEY;  Surgeon: [name], MD;  Location: [place];  Service: Orthopedics;  Laterality: Left;          Current Outpatient Prescriptions  Medication Sig Dispense Refill  . aspirin EC 81 MG tablet Take 81 mg by mouth at bedtime.    [name] atorvastatin (LIPITOR) 80 MG tablet Take 1 tablet (80 mg total) by mouth daily at 6 PM. 30 tablet 11  . Blood Glucose Monitoring Suppl (ONE TOUCH ULTRA 2) w/Device KIT Use to obtain blood sugar daily. Dx Code E11.40 1 each 0  . carvedilol (COREG) 12.5 MG tablet Take 0.5 tablets (6.25 mg total) by mouth 2 (two) times daily with a meal. 1 tablet 0  . clopidogrel (PLAVIX) 75 MG tablet Take 1 tablet (75 mg total) by mouth daily with breakfast. 30 tablet 11  . DHA-Vitamin C-Lutein (EYE HEALTH FORMULA PO) Take 1 tablet by mouth daily.     . DULoxetine (CYMBALTA) 30 MG capsule Take 1 capsule (30 mg total) by mouth daily. 30 capsule 5  . furosemide (LASIX) 40 MG tablet TAKE 1  OR 2 TABLETS BY MOUTH DAILY 60 tablet 10  . glucose blood (ONE TOUCH ULTRA TEST) test strip USE TO CHECK BLOOD SUGAR ONCE A DAY Dx Code E11.40 100 each 3  . metFORMIN (GLUCOPHAGE) 1000 MG tablet Take 1 tablet (1,000 mg total) by mouth daily with breakfast. 30 tablet 3  . Multiple Vitamin (MULTIVITAMIN WITH MINERALS) TABS tablet Take 1 tablet by mouth daily.    . mycophenolate (CELLCEPT) 500 MG tablet 500 mg by mouth daily for 1 week and then increase it to 1 tablet by mouth twice a day. 60 tablet 2  . nitroGLYCERIN (NITROSTAT) 0.4 MG SL tablet DISSOLVE 1 TABLET UNDER THE TONGUE FOR CHEST PAIN. MAY REPEAT EVERY 5MINUTES UP TO 3 DOSES. IF NO RELIEF, CALL 911** 25 tablet 5  . ondansetron (ZOFRAN) 4 MG tablet Take 1 tablet (4 mg total) by mouth every 8 (eight) hours as needed for nausea or vomiting. 20 tablet 0  . ONETOUCH DELICA LANCETS 19J MISC     . pantoprazole (PROTONIX) 40 MG tablet Take 1 tablet (40 mg total) by mouth 2 (two) times daily. 60 tablet 3  . sulfamethoxazole-trimethoprim (BACTRIM) 400-80 MG tablet 1 tablet po 3 times a week 12 tablet 2   No current facility-administered medications for this visit.          Allergies as of 10/15/2016 - Review Complete 10/15/2016  Allergen Reaction Noted  . Doxazosin mesylate Other (See Comments) 09/23/2006  . Methocarbamol Rash          Family History  Problem Relation Age of Onset  . COPD Mother   . Heart disease Father   . Heart attack Father   . Diabetes Brother   . Colon cancer Brother 25  . Alcohol abuse Sister   . Stroke Sister     Social History        Social History  . Marital status: Married    Spouse name: N/A  . Number of children: 3  . Years of education: N/A        Occupational History  . Conservation officer, nature Retired    retired         Social History Main Topics  . Smoking status: Former Smoker    Packs/day: 1.00    Years: 20.00    Types: Cigarettes    Quit date: 04/06/1963  .  Smokeless tobacco: Never Used  . Alcohol use No     Comment:  01/01/2012 "last alcohol ~ 50 yr ago"  . Drug use: No  . Sexual activity: Not Currently       Other Topics Concern  . Not on file      Social History Narrative   No living will   Requests wife as health care POA   Discussed DNR --he requests this (done 08/29/12)   Not sure about feeding tube---but might accept for some time   Patient lives with wife and daughter in a one story home.  Has 3 children.  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    Review of Systems:    Constitutional: No weight loss, fever or chills Skin: No rash  Cardiovascular: No chest pain Respiratory: No SOB Gastrointestinal: See HPI and otherwise negative   Physical Exam:  Vital signs: BP 140/78   Pulse 64   Ht _0  (1.854 m)   Wt 207 lb 12.8 oz (94.3 kg)   BMI 27.42 kg/m   Constitutional:   Pleasant elderly Caucasian male appears to be in NAD, Well developed, Well nourished, alert and cooperative Respiratory: Respirations even and unlabored. Lungs clear to auscultation bilaterally.   No wheezes, crackles, or rhonchi.  Cardiovascular: Normal S1, S2. No MRG. Regular rate and rhythm. No peripheral edema, cyanosis or pallor.  Gastrointestinal:  Soft, nondistended, nontender. No rebound or guarding. Normal bowel sounds. No appreciable masses or hepatomegaly. Psychiatric:  Demonstrates good judgement and reason without abnormal affect or behaviors.  MOST RECENT LABS AND IMAGING: CBC Labs(Brief)     Component Value Date/Time   WBC 8.3 08/17/2016 1134   RBC 3.85 (L) 08/17/2016 1134   HGB 9.4 (L) 08/17/2016 1134   HGB 10.0 (L) 05/11/2016 1233   HCT 28.7 (L) 08/17/2016 1134   HCT 29.4 (L) 05/11/2016 1233   PLT 317.0 08/17/2016 1134   PLT 249 05/11/2016 1233   MCV 74.4 (L) 08/17/2016 1134   MCV 81 05/11/2016 1233   MCH 24.4 (L) 08/10/2016 1458   MCHC 32.7 08/17/2016 1134   RDW 18.2 (H) 08/17/2016  1134   RDW 16.3 (H) 05/11/2016 1233   LYMPHSABS 1.5 08/17/2016 1134   LYMPHSABS 1.7 05/11/2016 1233   MONOABS 0.7 08/17/2016 1134   EOSABS 0.2 08/17/2016 1134   EOSABS 0.4 05/11/2016 1233   BASOSABS 0.1 08/17/2016 1134   BASOSABS 0.1 05/11/2016 1233      CMP     Labs(Brief)          Component Value Date/Time   NA 137 08/31/2016 1100   NA 140 05/11/2016 1233   K 4.3 08/31/2016 1100   CL 99 08/31/2016 1100   CO2 30 08/31/2016 1100   GLUCOSE 160 (H) 08/31/2016 1100   BUN 24 (H) 08/31/2016 1100   BUN 36 (H) 05/11/2016 1233   CREATININE 2.00 (H) 08/31/2016 1100   CREATININE 2.29 (H) 08/03/2016 0927   CALCIUM 9.7 08/31/2016 1100   PROT 8.0 08/25/2016 1548   PROT 7.4 05/11/2016 1233   ALBUMIN 4.1 08/25/2016 1548   ALBUMIN 3.9 05/11/2016 1233   AST 17 08/25/2016 1548   ALT 9 08/25/2016 1548   ALKPHOS 62 08/25/2016 1548   BILITOT 0.6 08/25/2016 1548   BILITOT 0.3 05/11/2016 1233   GFRNONAA 26 (L) 08/03/2016 0927   GFRAA 30 (L) 08/03/2016 0927      Assessment: 1. Dysphagia: History of peptic stricture as well as esophageal dysmotility, time of last esophagram in 2016 patient did not have signs of structural abnormality and EGD was  not pursued, since that time patient has had an increase in symptoms; consider peptic stricture versus worsening dysmotility versus other 2. Chronic anticoagulation: With Plavix 3. Nausea: unclear history, now on abx? Has tried altering meds with no help-may want to pursue EGD in the future for this if not better  Plan: 1. Ordered an esophagram with tablet for further evaluation. If this shows structural component, recommend an EGD with dilation. This should be scheduled with [name] in the [facility]. Patient would need to be off of his Plavix for 5 days prior to this procedure. We would need to communicate with his cardiologist to ensure that this is acceptable. 2. Reviewed anti-dysphagia measures including taking  small bites, drinking sips of water between bites, avoiding distraction while eating and the chin tuck technique. 3. Patient to continue Pantoprazole 40 mg twice daily 4. Patient to follow in clinic per recommendations after review of upcoming esophagram  GI ATTENDING  Patient seen in the office as outlined above. No interval clinical change. He is here today with his wife. He has been seen and examined at bedside. He did have the swallowing study performed which showed both dysmotility and suggestion of stricture distally. He is for endoscopy today with dilation in hopes of improving his swallowing. We could consider manometry if this is not helpful. He has been off Plavix 5 days.The nature of the procedure, as well as the risks, benefits, and alternatives were carefully and thoroughly reviewed with the patient. Ample time for discussion and questions allowed. The patient understood, was satisfied, and agreed to proceed.  Docia Chuck. Geri Seminole., M.D. [facility] Division of Gastroenterology

## 2017-03-02 NOTE — Anesthesia Postprocedure Evaluation (Signed)
Anesthesia Post Note  Patient: [name]  Procedure(s) Performed: ESOPHAGOGASTRODUODENOSCOPY (EGD) (N/A ) [name] DILATION     Patient location during evaluation: Endoscopy Anesthesia Type: MAC Level of consciousness: awake and alert Pain management: pain level controlled Vital Signs Assessment: post-procedure vital signs reviewed and stable Respiratory status: nonlabored ventilation, respiratory function stable and spontaneous breathing Cardiovascular status: stable and blood pressure returned to baseline Postop Assessment: no apparent nausea or vomiting Anesthetic complications: no    Last Vitals:  Vitals:   03/01/17 1120 03/01/17 1130  BP: (!) 185/82 (!) 199/84  Pulse: 70 62  Resp: 20 20  Temp:    SpO2: 97% 98%    Last Pain:  Vitals:   03/01/17 1110  TempSrc: Oral                 Catalina Gravel

## 2017-03-03 ENCOUNTER — Encounter (HOSPITAL_COMMUNITY): Payer: Self-pay | Admitting: Internal Medicine

## 2017-03-17 ENCOUNTER — Encounter: Payer: Self-pay | Admitting: Student in an Organized Health Care Education/Training Program

## 2017-03-17 ENCOUNTER — Other Ambulatory Visit: Payer: Self-pay

## 2017-03-17 ENCOUNTER — Ambulatory Visit
Payer: Medicare Other | Attending: Student in an Organized Health Care Education/Training Program | Admitting: Student in an Organized Health Care Education/Training Program

## 2017-03-17 ENCOUNTER — Telehealth: Payer: Self-pay

## 2017-03-17 VITALS — BP 174/84 | HR 86 | Temp 97.5°F | Ht 73.0 in | Wt 215.0 lb

## 2017-03-17 DIAGNOSIS — G629 Polyneuropathy, unspecified: Secondary | ICD-10-CM

## 2017-03-17 DIAGNOSIS — Z79899 Other long term (current) drug therapy: Secondary | ICD-10-CM | POA: Diagnosis not present

## 2017-03-17 DIAGNOSIS — E1142 Type 2 diabetes mellitus with diabetic polyneuropathy: Secondary | ICD-10-CM | POA: Diagnosis not present

## 2017-03-17 DIAGNOSIS — Z833 Family history of diabetes mellitus: Secondary | ICD-10-CM | POA: Insufficient documentation

## 2017-03-17 DIAGNOSIS — I2510 Atherosclerotic heart disease of native coronary artery without angina pectoris: Secondary | ICD-10-CM | POA: Insufficient documentation

## 2017-03-17 DIAGNOSIS — M48061 Spinal stenosis, lumbar region without neurogenic claudication: Secondary | ICD-10-CM | POA: Diagnosis not present

## 2017-03-17 DIAGNOSIS — Z86010 Personal history of colonic polyps: Secondary | ICD-10-CM | POA: Insufficient documentation

## 2017-03-17 DIAGNOSIS — N400 Enlarged prostate without lower urinary tract symptoms: Secondary | ICD-10-CM | POA: Diagnosis not present

## 2017-03-17 DIAGNOSIS — M170 Bilateral primary osteoarthritis of knee: Secondary | ICD-10-CM | POA: Insufficient documentation

## 2017-03-17 DIAGNOSIS — E1122 Type 2 diabetes mellitus with diabetic chronic kidney disease: Secondary | ICD-10-CM | POA: Insufficient documentation

## 2017-03-17 DIAGNOSIS — G894 Chronic pain syndrome: Secondary | ICD-10-CM | POA: Insufficient documentation

## 2017-03-17 DIAGNOSIS — Z87891 Personal history of nicotine dependence: Secondary | ICD-10-CM | POA: Diagnosis not present

## 2017-03-17 DIAGNOSIS — E785 Hyperlipidemia, unspecified: Secondary | ICD-10-CM | POA: Diagnosis not present

## 2017-03-17 DIAGNOSIS — K449 Diaphragmatic hernia without obstruction or gangrene: Secondary | ICD-10-CM | POA: Insufficient documentation

## 2017-03-17 DIAGNOSIS — Z7982 Long term (current) use of aspirin: Secondary | ICD-10-CM | POA: Insufficient documentation

## 2017-03-17 DIAGNOSIS — Z9841 Cataract extraction status, right eye: Secondary | ICD-10-CM | POA: Insufficient documentation

## 2017-03-17 DIAGNOSIS — Z9049 Acquired absence of other specified parts of digestive tract: Secondary | ICD-10-CM | POA: Insufficient documentation

## 2017-03-17 DIAGNOSIS — Z811 Family history of alcohol abuse and dependence: Secondary | ICD-10-CM | POA: Insufficient documentation

## 2017-03-17 DIAGNOSIS — M059 Rheumatoid arthritis with rheumatoid factor, unspecified: Secondary | ICD-10-CM

## 2017-03-17 DIAGNOSIS — Z8249 Family history of ischemic heart disease and other diseases of the circulatory system: Secondary | ICD-10-CM | POA: Insufficient documentation

## 2017-03-17 DIAGNOSIS — I5032 Chronic diastolic (congestive) heart failure: Secondary | ICD-10-CM | POA: Diagnosis not present

## 2017-03-17 DIAGNOSIS — N183 Chronic kidney disease, stage 3 (moderate): Secondary | ICD-10-CM | POA: Diagnosis not present

## 2017-03-17 DIAGNOSIS — R1310 Dysphagia, unspecified: Secondary | ICD-10-CM | POA: Diagnosis not present

## 2017-03-17 DIAGNOSIS — J849 Interstitial pulmonary disease, unspecified: Secondary | ICD-10-CM | POA: Insufficient documentation

## 2017-03-17 DIAGNOSIS — Z7984 Long term (current) use of oral hypoglycemic drugs: Secondary | ICD-10-CM | POA: Diagnosis not present

## 2017-03-17 DIAGNOSIS — Z951 Presence of aortocoronary bypass graft: Secondary | ICD-10-CM | POA: Insufficient documentation

## 2017-03-17 DIAGNOSIS — Z79891 Long term (current) use of opiate analgesic: Secondary | ICD-10-CM | POA: Diagnosis not present

## 2017-03-17 DIAGNOSIS — Z96651 Presence of right artificial knee joint: Secondary | ICD-10-CM | POA: Insufficient documentation

## 2017-03-17 DIAGNOSIS — G4733 Obstructive sleep apnea (adult) (pediatric): Secondary | ICD-10-CM | POA: Diagnosis not present

## 2017-03-17 DIAGNOSIS — Z87442 Personal history of urinary calculi: Secondary | ICD-10-CM | POA: Diagnosis not present

## 2017-03-17 DIAGNOSIS — Z9889 Other specified postprocedural states: Secondary | ICD-10-CM | POA: Insufficient documentation

## 2017-03-17 DIAGNOSIS — Z888 Allergy status to other drugs, medicaments and biological substances status: Secondary | ICD-10-CM | POA: Diagnosis not present

## 2017-03-17 DIAGNOSIS — Z955 Presence of coronary angioplasty implant and graft: Secondary | ICD-10-CM | POA: Insufficient documentation

## 2017-03-17 DIAGNOSIS — I350 Nonrheumatic aortic (valve) stenosis: Secondary | ICD-10-CM | POA: Insufficient documentation

## 2017-03-17 DIAGNOSIS — J8410 Pulmonary fibrosis, unspecified: Secondary | ICD-10-CM | POA: Diagnosis not present

## 2017-03-17 DIAGNOSIS — R0689 Other abnormalities of breathing: Secondary | ICD-10-CM | POA: Diagnosis not present

## 2017-03-17 DIAGNOSIS — I252 Old myocardial infarction: Secondary | ICD-10-CM | POA: Insufficient documentation

## 2017-03-17 DIAGNOSIS — Z825 Family history of asthma and other chronic lower respiratory diseases: Secondary | ICD-10-CM | POA: Insufficient documentation

## 2017-03-17 DIAGNOSIS — K219 Gastro-esophageal reflux disease without esophagitis: Secondary | ICD-10-CM | POA: Insufficient documentation

## 2017-03-17 DIAGNOSIS — M069 Rheumatoid arthritis, unspecified: Secondary | ICD-10-CM | POA: Insufficient documentation

## 2017-03-17 DIAGNOSIS — Z823 Family history of stroke: Secondary | ICD-10-CM | POA: Insufficient documentation

## 2017-03-17 MED ORDER — TRAMADOL HCL 50 MG PO TABS: 50.0000 mg | ORAL_TABLET | Freq: Two times a day (BID) | ORAL | 1 refills | Status: DC | PRN

## 2017-03-17 NOTE — Telephone Encounter (Signed)
Patient said someone called from our office and was returning the call.

## 2017-03-17 NOTE — Telephone Encounter (Signed)
[name] has left them a message

## 2017-03-17 NOTE — Progress Notes (Signed)
Patient's Name: [name]  MRN: [id number]  Referring Provider: Alda Berthold, DO  DOB: June 25, 1931  PCP: [name], MD  DOS: 03/17/2017  Note by: Gillis Santa, MD  Service setting: Ambulatory outpatient  Specialty: Interventional Pain Management  Location: [facility] (AMB) Pain Management Facility  Visit type: Initial Patient Evaluation  Patient type: New Patient   Primary Reason(s) for Visit: Encounter for initial evaluation of one or more chronic problems (new to examiner) potentially causing chronic pain, and posing a threat to normal musculoskeletal function. (Level of risk: High) CC: Foot Burn (Both)  HPI  [name] is a 83 y.o. year old, male patient, who comes today to see [place] for the first time for an initial evaluation of his chronic pain. He has HLD (hyperlipidemia); Obstructive sleep apnea; Essential hypertension; Thoracic aorta atherosclerosis (Early); Reflux esophagitis; ESOPHAGEAL STRICTURE; GERD; DIVERTICULOSIS-COLON; BENIGN PROSTATIC HYPERTROPHY; ACTINIC KERATOSIS; SLEEP DISORDER, CHRONIC; Nausea; Chronic diastolic heart failure ([name]); ILD (interstitial lung disease) ([place]); CKD stage 3 due to type 2 diabetes mellitus ([place]); Routine general medical examination at a health care facility; Seropositive rheumatoid arthritis ([place]); Ventral hernia; DM (diabetes mellitus) type II controlled, neurological manifestation ([place]); Atherosclerotic heart disease of native coronary artery with angina pectoris ([place]); Respiratory failure, chronic ([place]); Spinal stenosis of lumbar region without neurogenic claudication; Spondylosis without myelopathy or radiculopathy, lumbar region; Tegretol-induced dizziness; Diarrhea; Orthostatic hypotension; High risk medication use; Primary osteoarthritis of both knees; History of right knee joint replacement; Dysphagia; and Esophageal stricture on their problem list. Today he comes in for evaluation of his Foot Burn (Both)  Pain Assessment: Location: Right, Left  Foot Radiating: Radiates Onset: More than a month ago Duration: Chronic pain Quality: Aching, Burning, Sharp, Shooting Severity: 7 /10 (self-reported pain score)  Note: Reported level is inconsistent with clinical observations. Clinically the patient looks like a       A 2/10 is viewed as "Mild to Moderate" and described as noticeable and distracting. Impossible to hide from other people. More frequent flare-ups. Still possible to adapt and function close to normal. It can be very annoying and may have occasional stronger flare-ups. With discipline, patients may get used to it and adapt.       When using our objective Pain Scale, levels between 6 and 10/10 are said to belong in an emergency room, as it progressively worsens from a 6/10, described as severely limiting, requiring emergency care not usually available at an outpatient pain management facility. At a 6/10 level, communication becomes difficult and requires great effort. Assistance to reach the emergency department may be required. Facial flushing and profuse sweating along with potentially dangerous increases in heart rate and blood pressure will be evident. Effect on ADL:   Timing: Constant Modifying factors: Denies  Onset and Duration: Present longer than 3 months Cause of pain: Unknown Severity: NAS-11 at its worse: 7/10, NAS-11 at its best: 2/10, NAS-11 now: 5/10 and NAS-11 on the average: 5/10 Timing: Not influenced by the time of the day Aggravating Factors: nothing Alleviating Factors: Sleeping Associated Problems: Nausea and Vomiting  Quality of Pain: Burning and Sharp Previous Examinations or Tests: CT scan, Endoscopy and X-rays Previous Treatments: The patient denies treatments  The patient comes into the clinics today for the first time for a chronic pain management evaluation.   83 year old male with a history of coronary artery disease, GERD, interstitial lung disease, rheumatoid arthritis, aortic stenosis, chronic  kidney disease stage III who presents with lower extremity neuropathic pain secondary to  diabetic polyneuropathy.  Patient's diagnosis of diabetes was over 10-15 years ago.  He is not on insulin.  He describes burning and tingling sensation in his toes.  This is worsened over time.  This is progressed to the dorsum of his foot.  Patient has been tried on various medications including desipramine, lidocaine cream, gabapentin at a dose of 900 mill grams in the morning, 600 mg in the afternoon, 600 mg at bedtime.  This dose had to be reduced secondary to his chronic kidney disease.  Patient was also tried on Lyrica 100 mill grams twice daily but it was too expensive for the patient to afford.  Furthermore Lyrica resulted in leg swelling and further dose escalation was limited by patient's chronic kidney disease stage III.  It was not very effective for his neuropathic pain symptoms.  Patient also has tried carbamazepine which was not effective for his neuropathic pain.  Patient has also had injections in his foot for his pain symptoms which were not effective.  Patient is not on any opioid therapy.  Although the patient did not endorse low back pain, according to chart review and his medical records he also has chronic lumbar radiculopathy and gets epidural steroid injections several times a year.  Today I took the time to provide the patient with information regarding my pain practice. The patient was informed that my practice is divided into two sections: an interventional pain management section, as well as a completely separate and distinct medication management section. I explained that I have procedure days for my interventional therapies, and evaluation days for follow-ups and medication management. Because of the amount of documentation required during both, they are kept separated. This means that there is the possibility that he may be scheduled for a procedure on one day, and medication management the  next. I have also informed him that because of staffing and facility limitations, I no longer take patients for medication management only. To illustrate the reasons for this, I gave the patient the example of surgeons, and how inappropriate it would be to refer a patient to his/her care, just to write for the post-surgical antibiotics on a surgery done by a different surgeon.   Because interventional pain management is my board-certified specialty, the patient was informed that joining my practice means that they are open to any and all interventional therapies. I made it clear that this does not mean that they will be forced to have any procedures done. What this means is that I believe interventional therapies to be essential part of the diagnosis and proper management of chronic pain conditions. Therefore, patients not interested in these interventional alternatives will be better served under the care of a different practitioner.  The patient was also made aware of my Comprehensive Pain Management Safety Guidelines where by joining my practice, they limit all of their nerve blocks and joint injections to those done by our practice, for as long as we are retained to manage their care.   Meds   Current Outpatient Medications:  .  aspirin EC 81 MG tablet, Take 81 mg by mouth at bedtime., Disp: , Rfl:  .  Blood Glucose Monitoring Suppl (ONE TOUCH ULTRA 2) w/Device KIT, Use to obtain blood sugar daily. Dx Code E11.40, Disp: 1 each, Rfl: 0 .  carvedilol (COREG) 3.125 MG tablet, Take 1 tablet (3.125 mg total) by mouth 2 (two) times daily with a meal., Disp: 180 tablet, Rfl: 3 .  clopidogrel (PLAVIX) 75 MG tablet,  TAKE 1 TABLET BY MOUTH DAILY WITH BREAKFAST., Disp: 30 tablet, Rfl: 10 .  DHA-Vitamin C-Lutein (EYE HEALTH FORMULA PO), Take 1 tablet by mouth daily. , Disp: , Rfl:  .  furosemide (LASIX) 40 MG tablet, TAKE 1 OR 2 TABLETS BY MOUTH DAILY (Patient taking differently: TAKE 1 TABLET BY MOUTH DAILY),  Disp: 60 tablet, Rfl: 10 .  glucose blood (ONE TOUCH ULTRA TEST) test strip, USE TO CHECK BLOOD SUGAR ONCE A DAY Dx Code E11.40, Disp: 100 each, Rfl: 3 .  metFORMIN (GLUCOPHAGE) 1000 MG tablet, Take 1 tablet (1,000 mg total) by mouth daily with breakfast., Disp: 30 tablet, Rfl: 3 .  Multiple Vitamin (MULTIVITAMIN WITH MINERALS) TABS tablet, Take 1 tablet by mouth daily., Disp: , Rfl:  .  mycophenolate (CELLCEPT) 500 MG tablet, TAKE 1 TABLET BY MOUTH TWICE A DAY, Disp: 180 tablet, Rfl: 0 .  nitroGLYCERIN (NITROSTAT) 0.4 MG SL tablet, DISSOLVE 1 TABLET UNDER THE TONGUE FOR CHEST PAIN. MAY REPEAT EVERY 5MINUTES UP TO 3 DOSES. IF NO RELIEF, CALL 911**, Disp: 25 tablet, Rfl: 5 .  ondansetron (ZOFRAN) 4 MG tablet, TAKE ONE TABLET BY MOUTH EVERY 8 HOURS AS NEEDED FOR NAUSEA OR VOMITING, Disp: 60 tablet, Rfl: 1 .  ONETOUCH DELICA LANCETS 18E MISC, , Disp: , Rfl:  .  OXYGEN, Inhale into the lungs. Per pt- Uses Oxygen 2 liters at night., Disp: , Rfl:  .  pantoprazole (PROTONIX) 40 MG tablet, Take 1 tablet (40 mg total) by mouth 2 (two) times daily., Disp: 60 tablet, Rfl: 3 .  sodium chloride 0.9 % infusion, Inject 50 mLs into the vein continuous., Disp: 1000 mL, Rfl: 0 .  sulfamethoxazole-trimethoprim (BACTRIM DS,SEPTRA DS) 800-160 MG tablet, TAKE HALF A TABLET BY MOUTH 3 TIMES A WEEK., Disp: 6 tablet, Rfl: 2 .  cephALEXin (KEFLEX) 500 MG capsule, Take 1 capsule (500 mg total) by mouth 3 (three) times daily. (Patient not taking: Reported on 03/17/2017), Disp: 15 capsule, Rfl: 0 .  traMADol (ULTRAM) 50 MG tablet, Take 1 tablet (50 mg total) by mouth every 12 (twelve) hours as needed for severe pain., Disp: 60 tablet, Rfl: 1  Imaging Review   Shoulder-L MR wo contrast:  Results for orders placed during the hospital encounter of 07/18/12  MR Shoulder Left Wo Contrast   Narrative  Clinical Data:  Left shoulder pain.  MRI OF THE LEFT SHOULDER WITHOUT CONTRAST  Technique:  Multiplanar, multisequence MR  imaging of the the left shoulder was performed.  No intravenous contrast was administered.  Comparison:  None  FINDINGS: Rotator cuff:  There is a full-thickness retracted tear of the supraspinatus tendon near its junction with infraspinatus tendon. There is 2 cm of retraction and the tear is 16 mm wide.  Moderate infraspinatus and subscapularis tendinopathy with interstitial tears. Muscles:  No significant fatty atrophy of the rotator cuff muscles. No muscle tear or myositis. Biceps long head:  Intact.  Tendinopathy involving the intra- articular portion.  Acromioclavicular Joint:  Moderate AC joint degenerative changes along with significant lateral downsloping of the type 2-3 acromion and undersurface spurring change. Glenohumeral Joint:  Mild to moderate degenerative changes.  No joint effusion.  Labrum:  Degenerated labra without discrete tear. Bones:  No acute bony findings.  IMPRESSION:  1.  Full-thickness retracted supraspinatus tendon tear. 2.  Moderate tendinopathy involving the infraspinatus and subscapularis tendons with small interstitial tears. 3.  AC joint degenerative changes, significant lateral downsloping of a type 2-3 acromion and undersurface spurring change all  likely contributing to bony impingement. 4.  Degenerative labral changes without obvious tear. 5.  Intact biceps tendon.  Mild tendinopathy.   Original Report Authenticated By: Marijo Sanes, M.D.      Lumbosacral Imaging: Lumbar MR wo contrast:  Results for orders placed during the hospital encounter of 03/12/16  MR LUMBAR SPINE WO CONTRAST   Narrative CLINICAL DATA:  Diffuse low back pain radiating to the legs.  EXAM: MRI LUMBAR SPINE WITHOUT CONTRAST  TECHNIQUE: Multiplanar, multisequence MR imaging of the lumbar spine was performed. No intravenous contrast was administered.  COMPARISON:  04/16/2011  FINDINGS: Segmentation: A transitional lumbosacral vertebra is assumed  to represent the S1 level. Careful correlation with this numbering strategy prior to any procedural intervention would be recommended. This is consistent with the prior lumbar MRI.  Alignment:  Unremarkable  Vertebrae: No significant vertebral marrow edema is identified. Intervertebral disc desiccation is observed at all levels between L3 and S1. No significant loss of disc height. Congenitally short pedicles in the lower lumbar spine.  Conus medullaris: Extends to the L2-3 level and appears normal. This is a low position, but partially accounted for by the fully segmental S1.  Paraspinal and other soft tissues: Unremarkable  Disc levels:  L2-3:  No impingement.  Mild facet arthropathy.  L3-4: Mild left and borderline right foraminal stenosis and mild left subarticular lateral recess stenosis due to disc bulge, short pedicles, and facet arthropathy, not appreciably changed from prior.  L4-5: Moderate central narrowing of the thecal sac with borderline bilateral foraminal stenosis due to disc bulge, facet arthropathy, and short pedicles, not appreciably changed from prior.  L5-S1: Prominent central narrowing of the thecal sac with mild bilateral foraminal stenosis and mild left and borderline right subarticular lateral recess stenosis due to short pedicles, disc bulge, and facet arthropathy. Cross-sectional area of the thecal sac 0.6 cm^ 2, formerly 0.8 cm^2.  S1-2: Mild right and borderline left foraminal stenosis due to facet arthropathy and disc bulge.  IMPRESSION: 1. A transitional lumbosacral vertebra is assumed to represent the S1 level. Careful correlation with this numbering strategy prior to any procedural intervention would be recommended. 2. Lumbar spondylosis and degenerative disc disease along with congenitally short pedicles, causing prominent impingement at L5-S1; moderate impingement at L4-5; and mild impingement at L3-4 and S1-2, as detailed above. The  findings at L5-S1 have worsened in the interval. 3. Stable mildly low-lying conus without tethering mass, although this low position of the conus is partially due to the classification scheme in which S1 is fully segmental.   Electronically Signed   By: Van Clines M.D.   On: 03/12/2016 14:07     Lumbar DG (Complete) 4+V:  Results for orders placed in visit on 10/30/99  DG Lumbar Spine Complete   Narrative FINDINGS HISTORY:        LOW BACK PAIN.  LEFT LEG PAIN. LUMBAR SPINE COMPLETE: FIVE FILMS INCLUDING A SINGLE AP VIEW OF THE THORACIC SPINE. THERE ARE TWELVE RIB-BEARING THORACIC VERTEBRAE.  ACCORDINGLY, THERE ARE SIX LUMBAR TYPE VERTEBRAE.  THERE IS MILD ANTERIOR OSTEOPHYTE FORMATION AT L4-5 AND TO A GREATER DEGREE AT L5-6.  VASCULAR CALCIFICATION IS NOTED IN THE AORTA.  THERE IS ADVANCED POSTERIOR ELEMENT HYPERTROPHY AT L6-S1 AND TO A LESSER DEGREE AT L5-6.  NO COMPRESSION FRACTURE OR MALALIGNMENT IS SEEN. IMPRESSION 1.    LOWER LUMBAR FACET ARTHROPATHY ESPECIALLY L6-S1. 2.    SIX LUMBAR VERTEBRAE. 3.    ALIGNMENT IS SATISFACTORY. MRI OF THE LUMBAR SPINE: MULTIPLANAR  AND MULTIPULSE SEQUENCES WERE OBTAINED WITHOUT CONTRAST.  SIX LUMBAR VERTEBRAE ARE PRESENT BASED ON PLAIN FILM FINDINGS.  THE ALIGNMENT IS SATISFACTORY.  THE MARROW SIGNAL IS HOMOGENOUS THROUGHOUT.  GOOD DISK HEIGHT AND HYDRATION IS SEEN. INDIVIDUAL DISK SPACES ARE EXAMINED AS FOLLOWS: L3-4:     MILD FACET HYPERTROPHY.  NO STENOSIS. L4-5:     MODERATE POSTERIOR ELEMENT HYPERTROPHY AFFECTING FACETS AND LIGAMENTUM FLAVA.  THERE MAY BE MILD LATERAL RECESS ENCROACHMENT ON THE LEFT.  NO HNP IS PRESENT. L5-6:    ADVANCED POSTERIOR HYPERTROPHIC AFFECTING FACETS AND LIGAMENTUM FLAVA.  LATERAL RECESS STENOSIS IS PRESENT ON THE LEFT WITH COMPRESSION OF THE EXITING NERVE ROOT (?L5 ROOT). L6-S1:  ADVANCED POSTERIOR ELEMENT HYPERTROPHY.  NO HNP OR SPINAL STENOSIS.  LARGE LATERAL PROJECTING OSTEOPHYTE IS  NOTED. INCIDENTAL NOTE IS MADE OF A SMALL DISK PROTRUSION CENTRALLY AT T12-L1 WHICH DOES NOT RESULT CONUS DEPRESSION. ALSO, INCIDENTAL NOTE IS MADE OF A 3 CM CYST PROJECTING MEDIALLY FROM THE UPPER POLE OF THE LEFT KIDNEY.  WHILE THIS APPEARS TO BE A SIMPLE CYST, I WOULD RECOMMEND CONFIRMATION WITH ULTRASOUND AS I CANNOT SEE THE ENTIRE EXTENT OF THE LESION ON THIS EXAMINATION AND IT IS POSSIBLE THAT A MURAL NODULE OR OTHER COMPLICATING FEATURE MIGHT BE APPARENT SUPERIORLY OR INFERIORLY. IMPRESSION 1.    SIX LUMBAR VERTEBRAE AS DESCRIBED. 2.    TINY CENTRAL HNP AT T12-L1. 3.    MULTILEVEL LOWER LUMBAR POSTERIOR ELEMENT HYPERTROPHY WORST AT L5-6 ON THE LEFT WITH EXITING NERVE ROOT ENCROACHMENT; THIS IS LIKELY RESPONSIBLE FOR THE PATIENT'S LEFT LEG PAIN; I AM UNABLE TO DETERMINE WHETHER THIS IS A LEFT L5 OR LEFT S1 ROOT. 4.    3 CM CYST IN THE LEFT KIDNEY WHICH CANNOT BE COMPLETELY EVALUATED WITH THIS EXAMINATION; RECOMMEND ULTRASOUND FOR FURTHER EVALUATION.   Lumbar DG Epidurogram IP:  Results for orders placed in visit on 12/29/99  IR Epidurography   Narrative FINDINGS CLINICAL DATA:  THE PATIENT HAS HAD EXCELLENT RESPONSE FOLLOWING EPIDURAL INJECTIONS. HE HAS MARKED DECREASE IN BACK AND LEFT RADICULAR SYMPTOMS.  A LEFT L5-6 INTERLAMINAR INJECTION WAS AGAIN PERFORMED TODAY. LEFT L5-6 INTERLAMINAR INJECTION: I PLACED A 20 GAUGE CRAWFORD NEEDLE INTO THE EPIDURAL SPACE ALONG THE INFERIOR LEFT L5 LAMINA. DIAGNOSTIC INJECTION WITH OMNIPAQUE 180 CONTRAST SHOWED DIFFUSE EPIDURAL SPREAD, PREFERENTIAL LEFT. THERE WAS NO VASCULAR UPTAKE. THERAPEUTIC INJECTION: 5 CC MIXTURE CONTAINING 120 MG DEPO-MEDROL WITH 3 CC OF 1% LIDOCAINE. HE TOLERATES THE PROCEDURE WITHOUT DIFFICULTY. HE WAS COMFORTABLE AT THE TIME OF HIS DISCHARGE. IMPRESSION 1.  LEFT L5-6 INTERLAMINAR EPIDUROGRAM WAS NORMAL IN APPEARANCE. 2.  THERAPEUTIC INTERLAMINAR EPIDURAL INJECTION. THE PATIENT WAS COMFORTABLE AT THE TIME OF  HIS DISCHARGE.    No results found for this or any previous visit.  Complexity Note: Imaging results reviewed. Results shared with Mr. Migliaccio, using Layman's terms.                         ROS  Cardiovascular History: High blood pressure, Heart surgery, Heart murmur and Heart catheterization Pulmonary or Respiratory History: Shortness of breath Neurological History: No reported neurological signs or symptoms such as seizures, abnormal skin sensations, urinary and/or fecal incontinence, being born with an abnormal open spine and/or a tethered spinal cord Review of Past Neurological Studies: No results found for this or any previous visit. Psychological-Psychiatric History: No reported psychological or psychiatric signs or symptoms such as difficulty sleeping, anxiety, depression, delusions or hallucinations (schizophrenial), mood swings (bipolar disorders) or suicidal  ideations or attempts Gastrointestinal History: Reflux or heatburn Genitourinary History: Passing kidney stones Hematological History: No reported hematological signs or symptoms such as prolonged bleeding, low or poor functioning platelets, bruising or bleeding easily, hereditary bleeding problems, low energy levels due to low hemoglobin or being anemic Endocrine History: No reported endocrine signs or symptoms such as high or low blood sugar, rapid heart rate due to high thyroid levels, obesity or weight gain due to slow thyroid or thyroid disease Rheumatologic History: No reported rheumatological signs and symptoms such as fatigue, joint pain, tenderness, swelling, redness, heat, stiffness, decreased range of motion, with or without associated rash Musculoskeletal History: Negative for myasthenia gravis, muscular dystrophy, multiple sclerosis or malignant hyperthermia Work History: Retired  Allergies  Mr. Hall is allergic to doxazosin mesylate and methocarbamol.  Laboratory Chemistry  Inflammation Markers (CRP: Acute  Phase) (ESR: Chronic Phase) Lab Results  Component Value Date   ESRSEDRATE 38 (H) 11/22/2011                 Rheumatology Markers Lab Results  Component Value Date   RF 23 (H) 11/22/2011   ANA NEG 11/22/2011                Renal Function Markers Lab Results  Component Value Date   BUN 23 01/25/2017   CREATININE 2.04 (H) 01/25/2017   GFRAA 34 (L) 01/25/2017   GFRNONAA 29 (L) 01/25/2017                 Hepatic Function Markers Lab Results  Component Value Date   AST 14 01/25/2017   ALT 9 01/25/2017   ALBUMIN 3.8 11/23/2016   ALKPHOS 73 11/23/2016                 Electrolytes Lab Results  Component Value Date   NA 137 01/25/2017   K 4.6 01/25/2017   CL 98 01/25/2017   CALCIUM 9.3 01/25/2017   PHOS 3.6 04/08/2016                 Neuropathy Markers Lab Results  Component Value Date   VITAMINB12 253 08/10/2016   FOLATE 17.6 08/10/2016   HGBA1C 8.3 (H) 10/14/2016                 Bone Pathology Markers No results found for: VD25OH, [id number], [id number], [id number], 25OHVITD1, 25OHVITD2, 25OHVITD3, TESTOFREE, TESTOSTERONE               Coagulation Parameters Lab Results  Component Value Date   INR 1.12 08/13/2015   LABPROT 14.6 08/13/2015   APTT 29 08/13/2015   PLT 285 01/25/2017                 Cardiovascular Markers Lab Results  Component Value Date   BNP 318.4 (H) 08/13/2015   CKTOTAL 73 12/31/2011   CKMB 3.4 12/31/2011   TROPONINI 0.68 (HH) 08/14/2015   HGB 9.2 (L) 01/25/2017   HCT 30.3 (L) 01/25/2017                 CA Markers No results found for: CEA, CA125, LABCA2               Note: Lab results reviewed.  [place]  Drug: Mr. Thielke  reports that he does not use drugs. Alcohol:  reports that he does not drink alcohol. Tobacco:  reports that he quit smoking about 53 years ago. His smoking use included cigarettes. He has a 20.00 pack-year smoking history. He has quit  using smokeless tobacco. His smokeless tobacco use included chew. Medical:   has a past medical history of Arthritis, CAD (coronary artery disease), Chronic diastolic CHF (congestive heart failure) ([employer]), Chronic kidney disease, stage III (moderate) ([place]), Chronic lower back pain, Colon polyps, Diverticulosis, Dyspnea (2009 since July -Sept), Enlarged prostate, Esophageal stricture, GERD (gastroesophageal reflux disease), Heart murmur, Hiatal hernia, History of kidney stones, History of PFTs, Hyperlipidemia, Hypertension, Interstitial lung disease ([place]), Overweight (BMI 25.0-29.9), RA (rheumatoid arthritis) ([place]), Seropositive rheumatoid arthritis ([place]), and Type II diabetes mellitus ([place]). Family: family history includes Alcohol abuse in his sister; COPD in his mother; Colon cancer (age of onset: 47) in his brother; Diabetes in his brother; Heart attack in his father; Heart disease in his father; Stroke in his sister.  Past Surgical History:  Procedure Laterality Date  . CARDIAC CATHETERIZATION  08/2004   CP- no MI, Cath- small vessell disease   . CARDIAC CATHETERIZATION  12/31/2011   80% distal LM, 100% native LAD, LCx and RCA, 30% prox SVG-OM, SVG-D1 normal, 99% distal, 80% ostial SVG-RCA distal to graft, LIMA-LAD normal; LVEF mildly decreased with posterior basal AK   . CARDIAC CATHETERIZATION  2009   with patent grafts/notes 12/31/2011  . CARDIAC CATHETERIZATION N/A 08/13/2015   Procedure: Left Heart Cath and Cors/Grafts Angiography;  Surgeon: Sherren Mocha, MD;  Location: [facility];  Service: Cardiovascular;  Laterality: N/A;  . CATARACT EXTRACTION W/ INTRAOCULAR LENS  IMPLANT, BILATERAL Bilateral   . CHOLECYSTECTOMY OPEN  11/2003   [name]  . CORONARY ANGIOPLASTY WITH STENT PLACEMENT  01/03/2012   Successful DES to SVG-RCA and cutting balloon angioplasty ostial  PDA   . CORONARY ANGIOPLASTY WITH STENT PLACEMENT  01/14/2014   "1"  . CORONARY ARTERY BYPASS GRAFT  11/1999   CABG X5  . CORONARY STENT PLACEMENT  02/2012   1 stent and balloon  .  ESOPHAGOGASTRODUODENOSCOPY N/A 03/01/2017   Procedure: ESOPHAGOGASTRODUODENOSCOPY (EGD);  Surgeon: Irene Shipper, MD;  Location: [facility];  Service: Endoscopy;  Laterality: N/A;  . ESOPHAGOGASTRODUODENOSCOPY (EGD) WITH ESOPHAGEAL DILATION  2010  . HAND SURGERY    . JOINT REPLACEMENT    . KNEE ARTHROSCOPY Right 2008  . LEFT AND RIGHT HEART CATHETERIZATION WITH CORONARY ANGIOGRAM  12/31/2011   Procedure: LEFT AND RIGHT HEART CATHETERIZATION WITH CORONARY ANGIOGRAM;  Surgeon: Burnell Blanks, MD;  Location: [facility] CATH LAB;  Service: Cardiovascular;;  . LEFT AND RIGHT HEART CATHETERIZATION WITH CORONARY ANGIOGRAM N/A 01/14/2014   Procedure: LEFT AND RIGHT HEART CATHETERIZATION WITH CORONARY ANGIOGRAM;  Surgeon: Peter M Martinique, MD;  Location: [facility] CATH LAB;  Service: Cardiovascular;  Laterality: N/A;  . MALONEY DILATION  03/01/2017   Procedure: Venia Minks DILATION;  Surgeon: Irene Shipper, MD;  Location: [facility];  Service: Endoscopy;;  . PERCUTANEOUS CORONARY INTERVENTION-BALLOON ONLY  01/03/2012   Procedure: PERCUTANEOUS CORONARY INTERVENTION-BALLOON ONLY;  Surgeon: Peter M Martinique, MD;  Location: [facility] CATH LAB;  Service: Cardiovascular;;  . PERCUTANEOUS CORONARY STENT INTERVENTION (PCI-S)  12/31/2011   Procedure: PERCUTANEOUS CORONARY STENT INTERVENTION (PCI-S);  Surgeon: Burnell Blanks, MD;  Location: [facility] CATH LAB;  Service: Cardiovascular;;  . PERCUTANEOUS CORONARY STENT INTERVENTION (PCI-S) N/A 01/03/2012   Procedure: PERCUTANEOUS CORONARY STENT INTERVENTION (PCI-S);  Surgeon: Peter M Martinique, MD;  Location: [facility] CATH LAB;  Service: Cardiovascular;  Laterality: N/A;  . SHOULDER ARTHROSCOPY WITH OPEN ROTATOR CUFF REPAIR AND DISTAL CLAVICLE ACROMINECTOMY Left 02/27/2013   Procedure: LEFT SHOULDER ARTHROSCOPY WITH MINI OPEN ROTATOR CUFF REPAIR AND SUBACROMIAL DECOMPRESSION AND  DISTAL CLAVICLE RESECTION;  Surgeon: [name], MD;  Location: [place];  Service: Orthopedics;  Laterality: Left;   . TOTAL KNEE ARTHROPLASTY Right 03/2010   [name]  . TRIGGER FINGER RELEASE Left 02/27/2013   Procedure: RELEASE TRIGGER FINGER/A-1 PULLEY;  Surgeon: [name], MD;  Location: [place];  Service: Orthopedics;  Laterality: Left;   Active Ambulatory Problems    Diagnosis Date Noted  . HLD (hyperlipidemia) 09/23/2006  . Obstructive sleep apnea 12/17/2008  . Essential hypertension 09/23/2006  . Thoracic aorta atherosclerosis ([place]) 03/19/2008  . Reflux esophagitis 09/10/2008  . ESOPHAGEAL STRICTURE 10/09/2008  . GERD 09/23/2006  . DIVERTICULOSIS-COLON 09/10/2008  . BENIGN PROSTATIC HYPERTROPHY 09/23/2006  . ACTINIC KERATOSIS 10/23/2007  . SLEEP DISORDER, CHRONIC 10/17/2006  . Nausea 02/16/2011  . Chronic diastolic heart failure ([place]) 09/14/2011  . ILD (interstitial lung disease) ([place]) 11/28/2011  . CKD stage 3 due to type 2 diabetes mellitus ([place])   . Routine general medical examination at a health care facility 08/29/2012  . Seropositive rheumatoid arthritis ([place])   . Ventral hernia 12/17/2013  . DM (diabetes mellitus) type II controlled, neurological manifestation ([place])   . Atherosclerotic heart disease of native coronary artery with angina pectoris ([place])   . Respiratory failure, chronic ([place]) 11/21/2014  . Spinal stenosis of lumbar region without neurogenic claudication 03/23/2016  . Spondylosis without myelopathy or radiculopathy, lumbar region 03/23/2016  . Tegretol-induced dizziness 05/14/2016  . Diarrhea 08/18/2016  . Orthostatic hypotension 10/01/2016  . High risk medication use 10/21/2016  . Primary osteoarthritis of both knees 10/21/2016  . History of right knee joint replacement 10/21/2016  . Dysphagia   . Esophageal stricture    Resolved Ambulatory Problems    Diagnosis Date Noted  . UNSPEC ALVEOLAR&PARIETOALVEOLAR PNEUMONOPATHY 01/24/2008  . Acute and chronic respiratory failure 01/24/2008  . UNSPEC POLYARTHROPATHY/POLYARTHRITIS SITE UNSPEC 05/06/2010  .  SOB (shortness of breath) 04/27/2011  . Postural dizziness 04/27/2011  . Acute bronchitis 05/28/2011  . Chest pain 12/31/2011  . Acute bronchitis 04/04/2012  . Impingement syndrome of left shoulder 02/27/2013  . Frozen shoulder syndrome 02/27/2013  . Tear of left rotator cuff 02/27/2013  . Cough 03/15/2013  . Dyspnea 05/11/2013  . Dyspnea 09/23/2013  . Unstable angina ([place]) 01/15/2014  . Rheumatoid arteritis 03/04/2014  . NSTEMI (non-ST elevated myocardial infarction) ([place]) 08/13/2015   Past Medical History:  Diagnosis Date  . Arthritis   . CAD (coronary artery disease)   . Chronic diastolic CHF (congestive heart failure) ([place])   . Chronic kidney disease, stage III (moderate) ([name])   . Chronic lower back pain   . Colon polyps   . Diverticulosis   . Dyspnea 2009 since July -Sept  . Enlarged prostate   . Esophageal stricture   . GERD (gastroesophageal reflux disease)   . Heart murmur   . Hiatal hernia   . History of kidney stones   . History of PFTs   . Hyperlipidemia   . Hypertension   . Interstitial lung disease ([place])   . Overweight (BMI 25.0-29.9)   . RA (rheumatoid arthritis) ([place])   . Seropositive rheumatoid arthritis ([place])   . Type II diabetes mellitus ([place])    Constitutional Exam  General appearance: Well nourished, well developed, and well hydrated. In no apparent acute distress Vitals:   03/17/17 1427  BP: (!) 174/84  Pulse: 86  Temp: (!) 97.5 F (36.4 C)  SpO2: 100%  Weight: 215 lb (97.5 kg)  Height: '6\' 1"'$  (1.854 m)  BMI Assessment: Estimated body mass index is 28.37 kg/m as calculated from the following:   Height as of this encounter: '6\' 1"'$  (1.854 m).   Weight as of this encounter: 215 lb (97.5 kg).  BMI interpretation table: BMI level Category Range association with higher incidence of chronic pain  <18 kg/m2 Underweight   18.5-24.9 kg/m2 Ideal body weight   25-29.9 kg/m2 Overweight Increased incidence by 20%  30-34.9 kg/m2 Obese (Class I)  Increased incidence by 68%  35-39.9 kg/m2 Severe obesity (Class II) Increased incidence by 136%  >40 kg/m2 Extreme obesity (Class III) Increased incidence by 254%   BMI Readings from Last 4 Encounters:  03/17/17 28.37 kg/m  03/01/17 28.37 kg/m  01/31/17 26.97 kg/m  01/03/17 27.44 kg/m   Wt Readings from Last 4 Encounters:  03/17/17 215 lb (97.5 kg)  03/01/17 215 lb (97.5 kg)  01/31/17 204 lb 6.4 oz (92.7 kg)  01/03/17 208 lb (94.3 kg)  Psych/Mental status: Alert, oriented x 3 (person, place, & time)       Eyes: PERLA Respiratory: No evidence of acute respiratory distress  Cervical Spine Area Exam  Skin & Axial Inspection: No masses, redness, edema, swelling, or associated skin lesions Alignment: Symmetrical Functional ROM: Unrestricted ROM      Stability: No instability detected Muscle Tone/Strength: Functionally intact. No obvious neuro-muscular anomalies detected. Sensory (Neurological): Unimpaired Palpation: No palpable anomalies              Upper Extremity (UE) Exam    Side: Right upper extremity  Side: Left upper extremity  Skin & Extremity Inspection: Skin color, temperature, and hair growth are WNL. No peripheral edema or cyanosis. No masses, redness, swelling, asymmetry, or associated skin lesions. No contractures.  Skin & Extremity Inspection: Skin color, temperature, and hair growth are WNL. No peripheral edema or cyanosis. No masses, redness, swelling, asymmetry, or associated skin lesions. No contractures.  Functional ROM: Unrestricted ROM          Functional ROM: Unrestricted ROM          Muscle Tone/Strength: Functionally intact. No obvious neuro-muscular anomalies detected.  Muscle Tone/Strength: Functionally intact. No obvious neuro-muscular anomalies detected.  Sensory (Neurological): Unimpaired          Sensory (Neurological): Unimpaired          Palpation: No palpable anomalies              Palpation: No palpable anomalies              Specialized Test(s):  Deferred         Specialized Test(s): Deferred          Thoracic Spine Area Exam  Skin & Axial Inspection: No masses, redness, or swelling Alignment: Symmetrical Functional ROM: Unrestricted ROM Stability: No instability detected Muscle Tone/Strength: Functionally intact. No obvious neuro-muscular anomalies detected. Sensory (Neurological): Unimpaired Muscle strength & Tone: No palpable anomalies  Lumbar Spine Area Exam  Skin & Axial Inspection: No masses, redness, or swelling Alignment: Symmetrical Functional ROM: Mechanically restricted ROM      Stability: No instability detected Muscle Tone/Strength: Functionally intact. No obvious neuro-muscular anomalies detected. Sensory (Neurological): Unimpaired Palpation: No palpable anomalies       Provocative Tests: Lumbar Hyperextension and rotation test: Positive bilaterally for facet joint pain. Lumbar Lateral bending test: Positive due to pain. Patrick's Maneuver: evaluation deferred today                    Gait & Posture Assessment  Ambulation: Unassisted Gait: Relatively normal for age and body habitus Posture: WNL   Lower Extremity Exam    Side: Right lower extremity  Side: Left lower extremity  Skin & Extremity Inspection: Skin color, temperature, and hair growth are WNL. No peripheral edema or cyanosis. No masses, redness, swelling, asymmetry, or associated skin lesions. No contractures.  Skin & Extremity Inspection: Skin color, temperature, and hair growth are WNL. No peripheral edema or cyanosis. No masses, redness, swelling, asymmetry, or associated skin lesions. No contractures.  Functional ROM: Unrestricted ROM          Functional ROM: Unrestricted ROM          Muscle Tone/Strength: Functionally intact. No obvious neuro-muscular anomalies detected.  Muscle Tone/Strength: Functionally intact. No obvious neuro-muscular anomalies detected.  Sensory (Neurological): Paresthesia (Burning sensation)  Sensory (Neurological):  Paresthesia (Burning sensation)  Palpation: No palpable anomalies  Palpation: No palpable anomalies   Assessment  Primary Diagnosis & Pertinent Problem List: The primary encounter diagnosis was Diabetic polyneuropathy associated with type 2 diabetes mellitus ([place]). Diagnoses of Neuropathy, Seropositive rheumatoid arthritis ([place]), CKD stage 3 due to type 2 diabetes mellitus ([place]), and Chronic pain syndrome were also pertinent to this visit.  Visit Diagnosis (New problems to examiner): 1. Diabetic polyneuropathy associated with type 2 diabetes mellitus ([place])   2. Neuropathy   3. Seropositive rheumatoid arthritis ([place])   4. CKD stage 3 due to type 2 diabetes mellitus ([place])   5. Chronic pain syndrome    General Recommendations: The pain condition that the patient suffers from is best treated with a multidisciplinary approach that involves an increase in physical activity to prevent de-conditioning and worsening of the pain cycle, as well as psychological counseling (formal and/or informal) to address the co-morbid psychological affects of pain. Treatment will often involve judicious use of pain medications and interventional procedures to decrease the pain, allowing the patient to participate in the physical activity that will ultimately produce long-lasting pain reductions. The goal of the multidisciplinary approach is to return the patient to a higher level of overall function and to restore their ability to perform activities of daily living.  83 year old male with a history of coronary artery disease, GERD, interstitial lung disease, rheumatoid arthritis, aortic stenosis, chronic kidney disease stage III who presents with lower extremity neuropathic pain secondary to diabetic polyneuropathy.  Patient's diagnosis of diabetes was over 10-15 years ago.  He is not on insulin.  He describes burning and tingling sensation in his toes.  This is worsened over time.  This is progressed to the dorsum of his  foot.  Patient has been tried on various medications including desipramine, lidocaine cream, gabapentin at a dose of 900 mill grams in the morning, 600 mg in the afternoon, 600 mg at bedtime.  This dose had to be reduced secondary to his chronic kidney disease.  Patient was also tried on Lyrica 100 mill grams twice daily but it was too expensive for the patient to afford.  Furthermore Lyrica resulted in leg swelling and further dose escalation was limited by patient's chronic kidney disease stage III.  It was not very effective for his neuropathic pain symptoms.  Patient also has tried carbamazepine which was not effective for his neuropathic pain.  Patient has also had injections in his foot for his pain symptoms which were not effective.  Patient is not on any opioid therapy.  Since patient has tried a myriad of neuropathic agents including first second and third line medications have little  to offer him in terms of non-opioid analgesics that he has not tried.  I will prescribe him tramadol 50 mg twice daily as needed to take.  I will also obtain a urine drug screen today which is standard for new patients.  I will have the patient follow with me in 4-6 weeks.    Plan: -UDS today. -Tramadol prescription as below. -Follow-up in 4-6 weeks for medication management.   Previous medication trials: Lyrica, gabapentin, nortriptyline, desipramine, topical lidocaine, Aspercreme, Cymbalta, carbamazepine.  These medications were not effective, limited by dose titration secondary to his chronic kidney disease, or resulted in side effects of sedation and dizziness.  Ordered Lab-work, Procedure(s), Referral(s), & Consult(s): Orders Placed This Encounter  Procedures  . Compliance Drug Analysis, Ur   Pharmacotherapy (current): Medications ordered:  Meds ordered this encounter  Medications  . traMADol (ULTRAM) 50 MG tablet    Sig: Take 1 tablet (50 mg total) by mouth every 12 (twelve) hours as needed for  severe pain.    Dispense:  60 tablet    Refill:  1    For chronic pain   Medications administered during this visit: Wilkes C. Kozakiewicz had no medications administered during this visit.     Provider-requested follow-up: Return in about 6 weeks (around 04/28/2017).  Future Appointments  Date Time Provider [place]  03/25/2017 11:00 AM Brand Males, MD [facility] None  04/18/2017 12:00 PM [name], MD [facility]  04/25/2017  9:45 AM [name], MD [facility] None  04/26/2017  2:30 PM Gillis Santa, MD [facility] None    Primary Care Physician: [name], MD Location: [facility] Note by: Gillis Santa, M.D, Date: 03/17/2017; Time: 3:58 PM  There are no Patient Instructions on file for this visit.

## 2017-03-17 NOTE — Telephone Encounter (Signed)
New patient referral

## 2017-03-21 DIAGNOSIS — D3131 Benign neoplasm of right choroid: Secondary | ICD-10-CM | POA: Diagnosis not present

## 2017-03-21 DIAGNOSIS — H353213 Exudative age-related macular degeneration, right eye, with inactive scar: Secondary | ICD-10-CM | POA: Diagnosis not present

## 2017-03-21 DIAGNOSIS — H35372 Puckering of macula, left eye: Secondary | ICD-10-CM | POA: Diagnosis not present

## 2017-03-21 DIAGNOSIS — H353221 Exudative age-related macular degeneration, left eye, with active choroidal neovascularization: Secondary | ICD-10-CM | POA: Diagnosis not present

## 2017-03-24 LAB — COMPLIANCE DRUG ANALYSIS, UR

## 2017-03-25 ENCOUNTER — Ambulatory Visit: Payer: Medicare Other | Admitting: Internal Medicine

## 2017-04-04 ENCOUNTER — Telehealth: Payer: Self-pay | Admitting: Cardiovascular Disease

## 2017-04-04 NOTE — Telephone Encounter (Signed)
Patient is calling about having flutters for the last 2 weeks that last about 30 seconds here and there.  Patient denies any chest pain. Patient stated he has had nausea for the past 4 months. At this time patient is ordering from [facility] while on the phone. Patient stated he always has SOB, that he has had this for years. Informed patient that he needs to come in to be evaluated. Patient has appt with PA this week. Will forward to Dr. Johnsie Cancel for further advisement.

## 2017-04-04 NOTE — Telephone Encounter (Signed)
New Message  Patient c/o Palpitations:  High priority if patient c/o lightheadedness, shortness of breath, or chest pain  1) How long have you had palpitations/irregular HR/ Afib? Are you having the symptoms now? Yes for 2 weeks   2) Are you currently experiencing lightheadedness, SOB or CP? No   3) Do you have a history of afib (atrial fibrillation) or irregular heart rhythm?   4) Have you checked your BP or HR? (document readings if available): no   5) Are you experiencing any other symptoms? No

## 2017-04-06 ENCOUNTER — Other Ambulatory Visit: Payer: Self-pay | Admitting: Internal Medicine

## 2017-04-07 ENCOUNTER — Ambulatory Visit: Payer: Medicare Other | Admitting: Physician Assistant

## 2017-04-07 NOTE — Progress Notes (Signed)
Cardiology Office Note:    Date:  04/08/2017   ID:  [name], DOB 1931/05/16, MRN [id number]  PCP:  [name] Carbon, MD  Cardiologist:  [name] Rouge, MD   Referring MD: [name] Carbon, MD   Chief Complaint  Patient presents with  . Palpitations    History of Present Illness:    [name] is a 84 y.o. male with a hx of CAD status post CABG and subsequent PCI with DES to the SVG-RCA and cutting balloon angioplasty to the ostial PDA in September 2013, interstitial lung disease, aortic stenosis, chronic kidney disease, rheumatoid arthritis, hypertension, hyperlipidemia, diabetes.  He underwent PCI in October 2015 with DES to the SVG-OM2 secondary to in-stent restenosis.  His last cardiac catheterization was in 5/17 and demonstrated 99% in-stent restenosis in the proximal SVG-OM1/OM2.  This was treated with DES.  Last seen by Dr. Johnsie Cancel 9/18.  He called in recently with palpitations.    [name] returns for evaluation of palpitations.  Over the past 2 weeks, he has noted palpitations that he describes as a vibration in his chest.  At last maybe 30 seconds or last.  It does not occur every day.  He does not have any associated symptoms.  He notes chronic chest discomfort and dyspnea with exertion.  This is overall stable without change over many years.  He sleeps in a recliner.  He denies PND, edema, syncope.  He denies any weight changes.  Of note, he tells me he has been nauseated for 6 months with occasional vomiting.  He has been seen by gastroenterology and had recent esophageal dilatation.  He denies fevers or bleeding issues.  He also notes occasional lightheadedness.  This does not seem to be associated with his palpitations or nausea.  Prior CV studies:   The following studies were reviewed today:  Echo 01/17/17 Mild LVH, EF 60-65, inf AK, Gr 1 DD, mild to mod AS (mean 18, peak 32), mild AI  Echo 01/13/16 EF 55-60, no RWMA, Gr 2 DD, mild AS (mean 12, peak 26),  mild AI, mild reduced RVSF, PASP 46 (mod inc)  Echo 08/14/15 Mild LVH, EF 55-60, inf-lat HK, Gr 1 DD, mild AS (mean 12, peak 22), mild AI, MAC, mild MR, mild lAE, PASP 36  Cardiac Catheterization 08/13/15 LM ost 80 LAD prox 100 OM2 90 RCA prox 100 L-LAD ok S-OM1/OM2 prox 99 ISR S-RCA 100 (CTO) S-D1 prox 100 (CTO) PCI:  3.5 x 12 mm Resolute DES to S-OM1/OM2      Nuclear stress test 01/08/14 Overall Impression:  High risk stress nuclear study with two perfusion defects: 1. Medium size severe severity ischemia in the mid LAD territory (SDS 8) and 2. Large scar in the entire inferior, and basal and mid inferolateral walls.  LV Ejection Fraction: 45%.  LV Wall Motion:  Paradoxical septal motion and akinesis of the basal and mid inferior and inferolateral walls.    Past Medical History:  Diagnosis Date  . Arthritis    osteoarthritis, s/p R TKR, and digits  . CAD (coronary artery disease)    a. s/p CABG (2001)  b. s/p DES to RCA and cutting POBA to ostial PDA (2013)   c. s/p DES to SVG to OM2 (01/14/14) d. cath: 08/2015 NSTEMI w/ patent LIMA-LAD and 99% stenosis of SVG-OM w/ DES placed. CTO of SVG-RCA and SVG-D1.   [name] Chronic diastolic CHF (congestive heart failure) ([place])    a) 09/13 ECHO- LVEF 50-55%, grade  1 diastolic dysfunction, mild LA dilatation, atrial septal aneurysm, AV mobility restricted, but no sig AS by doppler; b) 09/04/08 ECHO- LVH, ef 60%, mild AS, c. echo 08/2015: EF perserved of 55-60% with inferolateral HK. Mild AS noted.  . Chronic kidney disease, stage III (moderate) ([place])   . Chronic lower back pain   . Colon polyps   . Diverticulosis   . Dyspnea 2009 since July -Sept   05/06/08-CPST-  normal effort, reduced VO2 max 20.5 /65%, reduced at 8.2/ 40%, normal breathing resetvca of 55%, submaximal heart rate response 112/77%, flattened o2 pluse response at peak exercise-12 ml/beat @ 85%, No VQ mismatch abnormalities, All c/w CIRC Limitation  . Enlarged prostate   . Esophageal  stricture    a. s/p dilation spring 2010  . GERD (gastroesophageal reflux disease)   . Heart murmur   . Hiatal hernia   . History of kidney stones   . History of PFTs    mixed pattern on spiro. mild restn on lung volumes with near normal DLCO. Pattern can be explained by CABG scar. Fev1 2.2L/73%, ratio 68 (67), TLC 4.7/68%,RV 1.5L/55%,DLCO 79%  . Hyperlipidemia   . Hypertension   . Interstitial lung disease ([name])    NOS  . Overweight (BMI 25.0-29.9)    BMI 29  . RA (rheumatoid arthritis) ([name])    Dr [name]  . Seropositive rheumatoid arthritis ([name])   . Type II diabetes mellitus ([name])     Past Surgical History:  Procedure Laterality Date  . CARDIAC CATHETERIZATION  08/2004   CP- no MI, Cath- small vessell disease   . CARDIAC CATHETERIZATION  12/31/2011   80% distal LM, 100% native LAD, LCx and RCA, 30% prox SVG-OM, SVG-D1 normal, 99% distal, 80% ostial SVG-RCA distal to graft, LIMA-LAD normal; LVEF mildly decreased with posterior basal AK   . CARDIAC CATHETERIZATION  2009   with patent grafts/notes 12/31/2011  . CARDIAC CATHETERIZATION N/A 08/13/2015   Procedure: Left Heart Cath and Cors/Grafts Angiography;  Surgeon: Sherren Mocha, MD;  Location: [facility];  Service: Cardiovascular;  Laterality: N/A;  . CATARACT EXTRACTION W/ INTRAOCULAR LENS  IMPLANT, BILATERAL Bilateral   . CHOLECYSTECTOMY OPEN  11/2003   Ardis [name]  . CORONARY ANGIOPLASTY WITH STENT PLACEMENT  01/03/2012   Successful DES to SVG-RCA and cutting balloon angioplasty ostial  PDA   . CORONARY ANGIOPLASTY WITH STENT PLACEMENT  01/14/2014   "1"  . CORONARY ARTERY BYPASS GRAFT  11/1999   CABG X5  . CORONARY STENT PLACEMENT  02/2012   1 stent and balloon  . ESOPHAGOGASTRODUODENOSCOPY N/A 03/01/2017   Procedure: ESOPHAGOGASTRODUODENOSCOPY (EGD);  Surgeon: Irene Shipper, MD;  Location: [facility];  Service: Endoscopy;  Laterality: N/A;  . ESOPHAGOGASTRODUODENOSCOPY (EGD) WITH ESOPHAGEAL DILATION  2010  . HAND  SURGERY    . JOINT REPLACEMENT    . KNEE ARTHROSCOPY Right 2008  . LEFT AND RIGHT HEART CATHETERIZATION WITH CORONARY ANGIOGRAM  12/31/2011   Procedure: LEFT AND RIGHT HEART CATHETERIZATION WITH CORONARY ANGIOGRAM;  Surgeon: Burnell Blanks, MD;  Location: [facility] C[facility] LAB;  Service: Cardiovascular;;  . LEFT AND RIGHT HEART CATHETERIZATION WITH CORONARY ANGIOGRAM N/A 01/14/2014   Procedure: LEFT AND RIGHT HEART CATHETERIZATION WITH CORONARY ANGIOGRAM;  Surgeon: Peter M Martinique, MD;  Location: [facility];  Service: Cardiovascular;  Laterality: N/A;  . MALONEY DILATION  03/01/2017   Procedure: [name] Minks DILATION;  Surgeon: Irene Shipper, MD;  Location: [facility];  Service: Endoscopy;;  . PERCUTANEOUS CORONARY INTERVENTION-BALLOON ONLY  01/03/2012   Procedure: PERCUTANEOUS CORONARY INTERVENTION-BALLOON ONLY;  Surgeon: Peter M Martinique, MD;  Location: [facility] C[facility] LAB;  Service: Cardiovascular;;  . PERCUTANEOUS CORONARY STENT INTERVENTION (PCI-S)  12/31/2011   Procedure: PERCUTANEOUS CORONARY STENT INTERVENTION (PCI-S);  Surgeon: Burnell Blanks, MD;  Location: [facility];  Service: Cardiovascular;;  . PERCUTANEOUS CORONARY STENT INTERVENTION (PCI-S) N/A 01/03/2012   Procedure: PERCUTANEOUS CORONARY STENT INTERVENTION (PCI-S);  Surgeon: Peter M Martinique, MD;  Location: [facility] CATH LAB;  Service: Cardiovascular;  Laterality: N/A;  . SHOULDER ARTHROSCOPY WITH OPEN ROTATOR CUFF REPAIR AND DISTAL CLAVICLE ACROMINECTOMY Left 02/27/2013   Procedure: LEFT SHOULDER ARTHROSCOPY WITH MINI OPEN ROTATOR CUFF REPAIR AND SUBACROMIAL DECOMPRESSION AND DISTAL CLAVICLE RESECTION;  Surgeon: [name], MD;  Location: [place] [facility];  Service: Orthopedics;  Laterality: Left;  . TOTAL KNEE ARTHROPLASTY Right 03/2010   [name]  . TRIGGER FINGER RELEASE Left 02/27/2013   Procedure: RELEASE TRIGGER FINGER/A-1 PULLEY;  Surgeon: [name], MD;  Location: [facility];  Service: Orthopedics;  Laterality: Left;    Current  Medications: Current Meds  Medication Sig  . aspirin EC 81 MG tablet Take 81 mg by mouth at bedtime.  . Blood Glucose Monitoring Suppl (ONE TOUCH ULTRA 2) w/Device KIT Use to obtain blood sugar daily. Dx Code E11.40  . carvedilol (COREG) 3.125 MG tablet Take 1 tablet (3.125 mg total) by mouth 2 (two) times daily with a meal.  . cephALEXin (KEFLEX) 500 MG capsule Take 1 capsule (500 mg total) by mouth 3 (three) times daily.  . clopidogrel (PLAVIX) 75 MG tablet TAKE 1 TABLET BY MOUTH DAILY WITH BREAKFAST.  [name] DHA-Vitamin C-Lutein (EYE HEALTH FORMULA PO) Take 1 tablet by mouth daily.   . furosemide (LASIX) 40 MG tablet TAKE 1 TO 2 TABLETS BY MOUTH EVERY DAY.  [name] glucose blood (ONE TOUCH ULTRA TEST) test strip USE TO CHECK BLOOD SUGAR ONCE A DAY Dx Code E11.40  . metFORMIN (GLUCOPHAGE) 1000 MG tablet Take 1 tablet (1,000 mg total) by mouth daily with breakfast.  . Multiple Vitamin (MULTIVITAMIN WITH MINERALS) TABS tablet Take 1 tablet by mouth daily.  . mycophenolate (CELLCEPT) 500 MG tablet TAKE 1 TABLET BY MOUTH TWICE A DAY  . nitroGLYCERIN (NITROSTAT) 0.4 MG SL tablet DISSOLVE 1 TABLET UNDER THE TONGUE FOR CHEST PAIN. MAY REPEAT EVERY 5MINUTES UP TO 3 DOSES. IF NO RELIEF, CALL 911**  . ondansetron (ZOFRAN) 4 MG tablet TAKE ONE TABLET BY MOUTH EVERY 8 HOURS AS NEEDED FOR NAUSEA OR VOMITING  . ONETOUCH DELICA LANCETS 91B MISC   . OXYGEN Inhale into the lungs. Per pt- Uses Oxygen 2 liters at night.  . pantoprazole (PROTONIX) 40 MG tablet Take 1 tablet (40 mg total) by mouth 2 (two) times daily.  . sodium chloride 0.9 % infusion Inject 50 mLs into the vein continuous.  [name] sulfamethoxazole-trimethoprim (BACTRIM DS,SEPTRA DS) 800-160 MG tablet TAKE HALF A TABLET BY MOUTH 3 TIMES A WEEK.  . traMADol (ULTRAM) 50 MG tablet Take 1 tablet (50 mg total) by mouth every 12 (twelve) hours as needed for severe pain.     Allergies:   Doxazosin mesylate and Methocarbamol   Social History   Tobacco Use  .  Smoking status: Former Smoker    Packs/day: 1.00    Years: 20.00    Pack years: 20.00    Types: Cigarettes    Last attempt to quit: 04/06/1963    Years since quitting: 54.0  . Smokeless tobacco: Former Systems developer  Types: Chew  Substance Use Topics  . Alcohol use: No    Alcohol/week: 0.0 oz    Comment: 01/01/2012 "last alcohol ~ 50 yr ago"  . Drug use: No     Family Hx: The patient's family history includes Alcohol abuse in his sister; COPD in his mother; Colon cancer (age of onset: 63) in his brother; Diabetes in his brother; Heart attack in his father; Heart disease in his father; Stroke in his sister.  ROS:   Please see the history of present illness.    ROS All other systems reviewed and are negative.   EKGs/Labs/Other Test Reviewed:    EKG:  EKG is  ordered today.  The ekg ordered today demonstrates normal sinus rhythm, HR 73, normal axis, QTC 451 ms, no change from prior tracing  Recent Labs: 01/25/2017: ALT 9; BUN 23; Creat 2.04; Hemoglobin 9.2; Platelets 285; Potassium 4.6; Sodium 137   Recent Lipid Panel Lab Results  Component Value Date/Time   CHOL 109 10/14/2016 02:29 PM   TRIG 177.0 (H) 10/14/2016 02:29 PM   HDL 30.20 (L) 10/14/2016 02:29 PM   CHOLHDL 4 10/14/2016 02:29 PM   LDLCALC 43 10/14/2016 02:29 PM    Physical Exam:    VS:  BP (!) 150/80   Pulse 73   Ht _0  (1.854 m)   Wt 205 lb (93 kg)   BMI 27.05 kg/m     Wt Readings from Last 3 Encounters:  04/08/17 205 lb (93 kg)  03/17/17 215 lb (97.5 kg)  03/01/17 215 lb (97.5 kg)     Physical Exam  Constitutional: He is oriented to person, place, and time. He appears well-developed and well-nourished. No distress.  HENT:  Head: Normocephalic and atraumatic.  Eyes: No scleral icterus.  Neck: No JVD present.  Cardiovascular: Normal rate and regular rhythm.  Murmur heard.  Harsh systolic murmur is present with a grade of 2/6 at the upper right sternal border. Pulmonary/Chest: He has no wheezes.    Bibasilar crackles  Abdominal: Soft.  Musculoskeletal: He exhibits no edema.  Neurological: He is alert and oriented to person, place, and time.  Skin: Skin is warm and dry.    ASSESSMENT:    1. Palpitations   2. Shortness of breath   3. Coronary artery disease involving native coronary artery of native heart without angina pectoris   4. Aortic valve stenosis, etiology of cardiac valve disease unspecified   5. ILD (interstitial lung disease) ([place])   6. CKD stage 3 due to type 2 diabetes mellitus ([place])   7. Nausea and vomiting, intractability of vomiting not specified, unspecified vomiting type    PLAN:    In order of problems listed above:  1. Palpitations Etiology not entirely clear.  Echocardiogram in October 2018 demonstrated normal LV function.  He denies syncope but has been lightheaded at times.  He is also had significant nausea and vomiting for the past 6 months.  However, the symptoms do not seem to be related to his palpitations.  -Labs today: Basic metabolic panel, CBC, TSH, magnesium  -Obtain 30-day event monitor  2. Shortness of breath He has chronic shortness of breath with associated chest discomfort.  This is unchanged over many years.  He is followed by pulmonology (Dr. Chase Caller).  He has a history of interstitial lung disease.  He does have some crackles at the bases on exam.  There has been some weight discrepancy in his chart in EMR.  I will obtain a BNP today.  If this is significantly elevated, I will adjust his Lasix.  3. Coronary artery disease History of CABG.  He has had subsequent stenting to the vein graft to the RCA and most recently in 2017 drug-eluting stent to the vein graft to the OM1/OM2.  He has chronic chest discomfort without significant change.  His ECG is unchanged.  Blood pressure is somewhat elevated today.  However, his chart does indicate he has had a history of orthostatic hypotension in the past.  No further testing indicated at this time.   Continue aspirin, beta-blocker, Plavix.  4. Aortic valve stenosis, etiology of cardiac valve disease unspecified Mild to moderate aortic stenosis by echocardiogram in October 2018.  No evidence of worsening on clinical evaluation today.  5. ILD (interstitial lung disease) ([place]) Continue follow-up with pulmonology as planned.  6. CKD stage 3 due to type 2 diabetes mellitus ([place])   - Plan: Basic Metabolic Panel (BMET)  7. Nausea and vomiting, intractability of vomiting not specified, unspecified vomiting type He has follow-up with his primary care doctor in the next couple of weeks.  I have encouraged him to discuss this with his primary care physician at that time.  Etiology of his gastrointestinal symptoms is not entirely clear.  He also has a gastroenterologist.  As noted, basic metabolic panel, CBC will be obtained today.   Dispo:  Return in about 6 weeks (around 05/20/2017) for Follow up after testing with Dr. Johnsie Cancel.   Medication Adjustments/Labs and Tests Ordered: Current medicines are reviewed at length with the patient today.  Concerns regarding medicines are outlined above.  Tests Ordered: Orders Placed This Encounter  Procedures  . Basic Metabolic Panel (BMET)  . Magnesium  . CBC  . TSH  . Pro b natriuretic peptide  . Cardiac event monitor  . EKG 12-Lead   Medication Changes: No orders of the defined types were placed in this encounter.   Signed, Richardson Dopp, PA-C  04/08/2017 11:44 AM    [facility] [address], [place], [place]  [address] Phone: [phone]; Fax: [phone]

## 2017-04-07 NOTE — Telephone Encounter (Signed)
;  last Rx 01/2016 #60 10R. Last OV 01/2017

## 2017-04-08 ENCOUNTER — Ambulatory Visit (INDEPENDENT_AMBULATORY_CARE_PROVIDER_SITE_OTHER): Payer: Medicare Other | Admitting: Physician Assistant

## 2017-04-08 ENCOUNTER — Ambulatory Visit: Payer: Medicare Other | Admitting: Physician Assistant

## 2017-04-08 ENCOUNTER — Encounter: Payer: Self-pay | Admitting: Physician Assistant

## 2017-04-08 ENCOUNTER — Encounter (INDEPENDENT_AMBULATORY_CARE_PROVIDER_SITE_OTHER): Payer: Self-pay

## 2017-04-08 VITALS — BP 150/80 | HR 73 | Ht 73.0 in | Wt 205.0 lb

## 2017-04-08 DIAGNOSIS — R0602 Shortness of breath: Secondary | ICD-10-CM | POA: Diagnosis not present

## 2017-04-08 DIAGNOSIS — N183 Chronic kidney disease, stage 3 unspecified: Secondary | ICD-10-CM

## 2017-04-08 DIAGNOSIS — E1122 Type 2 diabetes mellitus with diabetic chronic kidney disease: Secondary | ICD-10-CM

## 2017-04-08 DIAGNOSIS — I350 Nonrheumatic aortic (valve) stenosis: Secondary | ICD-10-CM | POA: Diagnosis not present

## 2017-04-08 DIAGNOSIS — I2510 Atherosclerotic heart disease of native coronary artery without angina pectoris: Secondary | ICD-10-CM | POA: Diagnosis not present

## 2017-04-08 DIAGNOSIS — R002 Palpitations: Secondary | ICD-10-CM

## 2017-04-08 DIAGNOSIS — J849 Interstitial pulmonary disease, unspecified: Secondary | ICD-10-CM | POA: Diagnosis not present

## 2017-04-08 DIAGNOSIS — R112 Nausea with vomiting, unspecified: Secondary | ICD-10-CM | POA: Diagnosis not present

## 2017-04-08 NOTE — Patient Instructions (Signed)
Medication Instructions:  Your physician recommends that you continue on your current medications as directed. Please refer to the Current Medication list given to you today.   Labwork: 1. TODAY BMET, CBC, TSH, PRO BNP, MAGNESIUM LEVEL  Testing/Procedures: Your physician has recommended that you wear an event monitor. Event monitors are medical devices that record the heart's electrical activity. Doctors most often [place] these monitors to diagnose arrhythmias. Arrhythmias are problems with the speed or rhythm of the heartbeat. The monitor is a small, portable device. You can wear one while you do your normal daily activities. This is usually used to diagnose what is causing palpitations/syncope (passing out).    Follow-Up: DR. Johnsie Cancel IN 6 WEEKS   Any Other Special Instructions Will Be Listed Below (If Applicable).     If you need a refill on your cardiac medications before your next appointment, please call your pharmacy.

## 2017-04-08 NOTE — Telephone Encounter (Signed)
Sent.  Future refills per pcp.  Thanks.

## 2017-04-09 LAB — CBC
Hematocrit: 28.5 % — ABNORMAL LOW (ref 37.5–51.0)
Hemoglobin: 8.8 g/dL — ABNORMAL LOW (ref 13.0–17.7)
MCH: 21.5 pg — ABNORMAL LOW (ref 26.6–33.0)
MCHC: 30.9 g/dL — AB (ref 31.5–35.7)
MCV: 70 fL — AB (ref 79–97)
PLATELETS: 329 10*3/uL (ref 150–379)
RBC: 4.09 x10E6/uL — AB (ref 4.14–5.80)
RDW: 17 % — ABNORMAL HIGH (ref 12.3–15.4)
WBC: 9.4 10*3/uL (ref 3.4–10.8)

## 2017-04-09 LAB — BASIC METABOLIC PANEL
BUN / CREAT RATIO: 9 — AB (ref 10–24)
BUN: 21 mg/dL (ref 8–27)
CO2: 24 mmol/L (ref 20–29)
Calcium: 10.1 mg/dL (ref 8.6–10.2)
Chloride: 96 mmol/L (ref 96–106)
Creatinine, Ser: 2.28 mg/dL — ABNORMAL HIGH (ref 0.76–1.27)
GFR calc Af Amer: 30 mL/min/{1.73_m2} — ABNORMAL LOW (ref >59)
GFR calc non Af Amer: 26 mL/min/{1.73_m2} — ABNORMAL LOW (ref >59)
GLUCOSE: 206 mg/dL — AB (ref 65–99)
POTASSIUM: 4.4 mmol/L (ref 3.5–5.2)
SODIUM: 139 mmol/L (ref 134–144)

## 2017-04-09 LAB — PRO B NATRIURETIC PEPTIDE: NT-Pro BNP: 626 pg/mL — ABNORMAL HIGH (ref 0–486)

## 2017-04-09 LAB — MAGNESIUM: MAGNESIUM: 1.9 mg/dL (ref 1.6–2.3)

## 2017-04-09 LAB — TSH: TSH: 2.1 u[IU]/mL (ref 0.450–4.500)

## 2017-04-11 NOTE — Progress Notes (Signed)
Office Visit Note  Patient: [name]             Date of Birth: 02-12-1932           MRN: [id number]             PCP: [name], MD Referring: [name], MD Visit Date: 04/25/2017 Occupation: @GUAROCC @    Subjective:  Other (not doing good )   History of Present Illness: [name] is a 84 y.o. male with history of sero positive rheumatoid arthritis and interstitial lung disease. He states he has not been doing well. He states she's been having nausea and vomiting since she's been taking Bactrim. He would like to get off the medication. He also states that he has not noticed any improvement in his breathing since she's been on CellCept. He's been having some shortness of breath and chest pain. He is seeing a cardiologist and using of heart monitor currently. He does have osteoarthritis in multiple joints. He is replaced right knee joint is a still painful.  Activities of Daily Living:  Patient reports morning stiffness for 10 minutes.   Patient Reports nocturnal pain.  Difficulty dressing/grooming: Denies Difficulty climbing stairs: Reports Difficulty getting out of chair: Reports Difficulty using hands for taps, buttons, cutlery, and/or writing: Denies   Review of Systems  Constitutional: Positive for fatigue. Negative for night sweats and weakness ( ).  HENT: Positive for mouth dryness. Negative for mouth sores and nose dryness.   Eyes: Negative for redness and dryness.  Respiratory: Positive for shortness of breath and difficulty breathing.   Cardiovascular: Positive for chest pain. Negative for palpitations, hypertension, irregular heartbeat and swelling in legs/feet.  Gastrointestinal: Negative for constipation and diarrhea.  Endocrine: Negative for increased urination.  Musculoskeletal: Positive for arthralgias, joint pain and morning stiffness. Negative for joint swelling, myalgias, muscle weakness, muscle tenderness and myalgias.  Skin:  Negative for color change, rash, hair loss, nodules/bumps, skin tightness, ulcers and sensitivity to sunlight.  Allergic/Immunologic: Negative for susceptible to infections.  Neurological: Negative for dizziness, fainting, memory loss and night sweats.  Hematological: Negative for swollen glands.  Psychiatric/Behavioral: Negative for depressed mood and sleep disturbance. The patient is not nervous/anxious.     PMFS History:  Patient Active Problem List   Diagnosis Date Noted  . Dysphagia   . Esophageal stricture   . High risk medication use 10/21/2016  . Primary osteoarthritis of both knees 10/21/2016  . History of right knee joint replacement 10/21/2016  . Orthostatic hypotension 10/01/2016  . Diarrhea 08/18/2016  . Tegretol-induced dizziness 05/14/2016  . Spinal stenosis of lumbar region without neurogenic claudication 03/23/2016  . Spondylosis without myelopathy or radiculopathy, lumbar region 03/23/2016  . Respiratory failure, chronic ([place]) 11/21/2014  . DM (diabetes mellitus) type II controlled, neurological manifestation ([place])   . Atherosclerotic heart disease of native coronary artery with angina pectoris ([place])   . Ventral hernia 12/17/2013  . Seropositive rheumatoid arthritis ([place])   . Routine general medical examination at a health care facility 08/29/2012  . CKD (chronic kidney disease) stage 4, GFR 15-29 ml/min ([name])   . ILD (interstitial lung disease) ([place]) 11/28/2011  . Chronic diastolic heart failure ([place]) 09/14/2011  . Nausea 02/16/2011  . Obstructive sleep apnea 12/17/2008  . ESOPHAGEAL STRICTURE 10/09/2008  . Reflux esophagitis 09/10/2008  . DIVERTICULOSIS-COLON 09/10/2008  . Thoracic aorta atherosclerosis ([place]) 03/19/2008  . ACTINIC KERATOSIS 10/23/2007  . SLEEP DISORDER, CHRONIC 10/17/2006  . HLD (hyperlipidemia) 09/23/2006  .  Essential hypertension 09/23/2006  . GERD 09/23/2006  . BENIGN PROSTATIC HYPERTROPHY 09/23/2006    Past Medical History:    Diagnosis Date  . Arthritis    osteoarthritis, s/p R TKR, and digits  . CAD (coronary artery disease)    a. s/p CABG (2001)  b. s/p DES to RCA and cutting POBA to ostial PDA (2013)   c. s/p DES to SVG to OM2 (01/14/14) d. cath: 08/2015 NSTEMI w/ patent LIMA-LAD and 99% stenosis of SVG-OM w/ DES placed. CTO of SVG-RCA and SVG-D1.   [name] Chronic diastolic CHF (congestive heart failure) ([place])    a) 09/13 ECHO- LVEF 15-17%, grade 1 diastolic dysfunction, mild LA dilatation, atrial septal aneurysm, AV mobility restricted, but no sig AS by doppler; b) 09/04/08 ECHO- LVH, ef 60%, mild AS, c. echo 08/2015: EF perserved of 55-60% with inferolateral HK. Mild AS noted.  . Chronic kidney disease, stage III (moderate) ([place])   . Chronic lower back pain   . Colon polyps   . Diverticulosis   . Dyspnea 2009 since July -Sept   05/06/08-CPST-  normal effort, reduced VO2 max 20.5 /65%, reduced at 8.2/ 40%, normal breathing resetvca of 55%, submaximal heart rate response 112/77%, flattened o2 pluse response at peak exercise-12 ml/beat @ 85%, No VQ mismatch abnormalities, All c/w CIRC Limitation  . Enlarged prostate   . Esophageal stricture    a. s/p dilation spring 2010  . GERD (gastroesophageal reflux disease)   . Heart murmur   . Hiatal hernia   . History of kidney stones   . History of PFTs    mixed pattern on spiro. mild restn on lung volumes with near normal DLCO. Pattern can be explained by CABG scar. Fev1 2.2L/73%, ratio 68 (67), TLC 4.7/68%,RV 1.5L/55%,DLCO 79%  . Hyperlipidemia   . Hypertension   . Interstitial lung disease ([name])    NOS  . Overweight (BMI 25.0-29.9)    BMI 29  . RA (rheumatoid arthritis) ([name])    [name]  . Seropositive rheumatoid arthritis ([place])   . Type II diabetes mellitus ([name])     Family History  Problem Relation Age of Onset  . COPD Mother   . Heart disease Father   . Heart attack Father   . Diabetes Brother   . Colon cancer Brother 25  . Alcohol abuse Sister   .  Stroke Sister    Past Surgical History:  Procedure Laterality Date  . CARDIAC CATHETERIZATION  08/2004   CP- no MI, Cath- small vessell disease   . CARDIAC CATHETERIZATION  12/31/2011   80% distal LM, 100% native LAD, LCx and RCA, 30% prox SVG-OM, SVG-D1 normal, 99% distal, 80% ostial SVG-RCA distal to graft, LIMA-LAD normal; LVEF mildly decreased with posterior basal AK   . CARDIAC CATHETERIZATION  2009   with patent grafts/notes 12/31/2011  . CARDIAC CATHETERIZATION N/A 08/13/2015   Procedure: Left Heart Cath and Cors/Grafts Angiography;  Surgeon: Sherren Mocha, MD;  Location: [place] [facility];  Service: Cardiovascular;  Laterality: N/A;  . CATARACT EXTRACTION W/ INTRAOCULAR LENS  IMPLANT, BILATERAL Bilateral   . CHOLECYSTECTOMY OPEN  11/2003   [name]  . CORONARY ANGIOPLASTY WITH STENT PLACEMENT  01/03/2012   Successful DES to SVG-RCA and cutting balloon angioplasty ostial  PDA   . CORONARY ANGIOPLASTY WITH STENT PLACEMENT  01/14/2014   "1"  . CORONARY ARTERY BYPASS GRAFT  11/1999   CABG X5  . CORONARY STENT PLACEMENT  02/2012   1 stent and balloon  .  ESOPHAGOGASTRODUODENOSCOPY N/A 03/01/2017   Procedure: ESOPHAGOGASTRODUODENOSCOPY (EGD);  Surgeon: Irene Shipper, MD;  Location: [facility];  Service: Endoscopy;  Laterality: N/A;  . ESOPHAGOGASTRODUODENOSCOPY (EGD) WITH ESOPHAGEAL DILATION  2010  . HAND SURGERY    . JOINT REPLACEMENT    . KNEE ARTHROSCOPY Right 2008  . LEFT AND RIGHT HEART CATHETERIZATION WITH CORONARY ANGIOGRAM  12/31/2011   Procedure: LEFT AND RIGHT HEART CATHETERIZATION WITH CORONARY ANGIOGRAM;  Surgeon: Burnell Blanks, MD;  Location: [facility] CATH LAB;  Service: Cardiovascular;;  . LEFT AND RIGHT HEART CATHETERIZATION WITH CORONARY ANGIOGRAM N/A 01/14/2014   Procedure: LEFT AND RIGHT HEART CATHETERIZATION WITH CORONARY ANGIOGRAM;  Surgeon: Peter M Martinique, MD;  Location: [facility] CATH LAB;  Service: Cardiovascular;  Laterality: N/A;  . MALONEY DILATION  03/01/2017    Procedure: Venia Minks DILATION;  Surgeon: Irene Shipper, MD;  Location: [facility];  Service: Endoscopy;;  . PERCUTANEOUS CORONARY INTERVENTION-BALLOON ONLY  01/03/2012   Procedure: PERCUTANEOUS CORONARY INTERVENTION-BALLOON ONLY;  Surgeon: Peter M Martinique, MD;  Location: [facility] CATH LAB;  Service: Cardiovascular;;  . PERCUTANEOUS CORONARY STENT INTERVENTION (PCI-S)  12/31/2011   Procedure: PERCUTANEOUS CORONARY STENT INTERVENTION (PCI-S);  Surgeon: Burnell Blanks, MD;  Location: [facility] CATH LAB;  Service: Cardiovascular;;  . PERCUTANEOUS CORONARY STENT INTERVENTION (PCI-S) N/A 01/03/2012   Procedure: PERCUTANEOUS CORONARY STENT INTERVENTION (PCI-S);  Surgeon: Peter M Martinique, MD;  Location: [facility] CATH LAB;  Service: Cardiovascular;  Laterality: N/A;  . SHOULDER ARTHROSCOPY WITH OPEN ROTATOR CUFF REPAIR AND DISTAL CLAVICLE ACROMINECTOMY Left 02/27/2013   Procedure: LEFT SHOULDER ARTHROSCOPY WITH MINI OPEN ROTATOR CUFF REPAIR AND SUBACROMIAL DECOMPRESSION AND DISTAL CLAVICLE RESECTION;  Surgeon: [name], MD;  Location: [facility];  Service: Orthopedics;  Laterality: Left;  . TOTAL KNEE ARTHROPLASTY Right 03/2010   [name]  . TRIGGER FINGER RELEASE Left 02/27/2013   Procedure: RELEASE TRIGGER FINGER/A-1 PULLEY;  Surgeon: [name], MD;  Location: [facility];  Service: Orthopedics;  Laterality: Left;   Social History   Social History Narrative   No living will   Requests wife as health care POA   Discussed DNR --he requests this (done 08/29/12)   Not sure about feeding tube---but might accept for some time   Patient lives with wife and daughter in a one story home.  Has 3 children.  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     Objective: Vital Signs: BP 132/84 (BP Location: Left Arm, Patient Position: Sitting, Cuff Size: Normal)   Pulse 87   Resp 18   Ht 6\' 1"  (1.854 m)   Wt 207 lb (93.9 kg)   BMI 27.31 kg/m    Physical Exam  Constitutional: He is oriented to person,  place, and time. He appears well-developed and well-nourished.  HENT:  Head: Normocephalic and atraumatic.  Eyes: Conjunctivae and EOM are normal. Pupils are equal, round, and reactive to light.  Neck: Normal range of motion. Neck supple.  Cardiovascular: Normal rate, regular rhythm and normal heart sounds.  Pulmonary/Chest: Effort normal. He has rales.  Bilateral lung bases  Abdominal: Soft. Bowel sounds are normal.  Neurological: He is alert and oriented to person, place, and time.  Skin: Skin is warm and dry. Capillary refill takes less than 2 seconds.  Psychiatric: He has a normal mood and affect. His behavior is normal.  Nursing note and vitals reviewed.    Musculoskeletal Exam: C-spine and thoracic lumbar spine limited range of motion. Shoulder joints elbows wrist joints are good range  of motion. He has no synovitis over her MCP joints. He has DIP PIP thickening. Hip joints are good range of motion. He has right total knee replacement which causes discomfort.  CDAI Exam: CDAI Homunculus Exam:   Joint Counts:  CDAI Tender Joint count: 0 CDAI Swollen Joint count: 0  Global Assessments:  Patient Global Assessment: 2 Provider Global Assessment: 2  CDAI Calculated Score: 4    Investigation: No additional findings. CBC Latest Ref Rng & Units 04/08/2017 01/25/2017 11/23/2016  WBC 3.4 - 10.8 x10E3/uL 9.4 7.4 8.3  Hemoglobin 13.0 - 17.7 g/dL 8.8(L) 9.2(L) 10.0(L)  Hematocrit 37.5 - 51.0 % 28.5(L) 30.3(L) 33.1(L)  Platelets 150 - 379 x10E3/uL 329 285 323   CMP Latest Ref Rng & Units 04/08/2017 01/25/2017 11/23/2016  Glucose 65 - 99 mg/dL 206(H) 222(H) 244(H)  BUN 8 - 27 mg/dL 21 23 22   Creatinine 0.76 - 1.27 mg/dL 2.28(H) 2.04(H) 1.93(H)  Sodium 134 - 144 mmol/L 139 137 136  Potassium 3.5 - 5.2 mmol/L 4.4 4.6 4.6  Chloride 96 - 106 mmol/L 96 98 98  CO2 20 - 29 mmol/L 24 29 24   Calcium 8.6 - 10.2 mg/dL 10.1 9.3 9.3  Total Protein 6.1 - 8.1 g/dL - 7.4 7.2  Total Bilirubin 0.2 -  1.2 mg/dL - 0.6 0.6  Alkaline Phos 40 - 115 U/L - - 73  AST 10 - 35 U/L - 14 16  ALT 9 - 46 U/L - 9 10    Imaging: No results found.  Speciality Comments: No specialty comments available.    Procedures:  No procedures performed Allergies: Doxazosin mesylate and Methocarbamol   Assessment / Plan:     Visit Diagnoses: Seropositive rheumatoid arthritis ([place]) - +RF +CCP. He has no active synovitis on examination.  ILD (interstitial lung disease) ([place]) -followed up by Dr. Chase Caller. I had detailed discussion with Dr. Chase Caller today. Patient has not noticed any improvement in his pulmonary symptoms on CellCept. He does not like the side effects of Bactrim DS. Dr. Chase Caller agreed that we should stop the CellCept and Bactrim DS. We will observe for right now. He has appointment coming up with Dr. Chase Caller this week. He's been having increased nausea and sometimes vomiting. He also requests seeing a GI doctor at [facility]. He states his daughter wants him to see a gastroenterologist for second opinion.  High risk medication use - Cellcept 500 mg po bid and Bactrim DS 1 tab po three times a week. I'm uncertain if the anemia and elevation of creatinine is due to Bactrim or CellCept. I've advised him to discontinue both medications. He was on Imuran in the past. If need be we can restart Imuran and future. I would like to see his repeat labs.  Anemia: He has a drop in his hemoglobin. I've advised to contact his PCP and also gastroenterologist regarding decrease in the hemoglobin. The drop in hemoglobin could be contributing to his shortness of breath and chest pain as well.  Primary osteoarthritis of left knee: He has some discomfort.  History of right knee joint replacement: He has discomfort in his right knee despite replacement.  Spondylosis without myelopathy or radiculopathy, lumbar region: Chronic pain  Chronic diastolic heart failure [facility]): He is followed by cardiologist.   CKD  stage 3 due to type 2 diabetes mellitus [facility]): He is been followed by Dr. Florene Glen. I'm uncertain if his some anemia is coming from the chronic kidney disease or some other etiology.  History of coronary  artery disease - CABG  Other medical problems are listed as follows:  History of diabetes mellitus  History of sleep apnea  History of hypertension  History of hyperlipidemia  History of gastroesophageal reflux (GERD) : Patient wants to be referred to Nadara Mustard PA-C, telephone [phone]. Fax number [phone] who practices at GI clinic at [facility]. I'll make the referral per his request.   Orders: No orders of the defined types were placed in this encounter.  No orders of the defined types were placed in this encounter.   Face-to-face time spent with patient was 30 minutes. Greater than 50% of time was spent in counseling and coordination of care.  Follow-Up Instructions: Return in about 3 months (around 07/24/2017) for Rheumatoid arthritis, ILD.   [name], MD  Note - This record has been created using Editor, commissioning.  Chart creation errors have been sought, but may not always  have been located. Such creation errors do not reflect on  the standard of medical care.

## 2017-04-12 ENCOUNTER — Ambulatory Visit (INDEPENDENT_AMBULATORY_CARE_PROVIDER_SITE_OTHER): Payer: Medicare Other

## 2017-04-12 DIAGNOSIS — R002 Palpitations: Secondary | ICD-10-CM | POA: Diagnosis not present

## 2017-04-16 ENCOUNTER — Other Ambulatory Visit: Payer: Self-pay | Admitting: Rheumatology

## 2017-04-17 DIAGNOSIS — J8410 Pulmonary fibrosis, unspecified: Secondary | ICD-10-CM | POA: Diagnosis not present

## 2017-04-17 DIAGNOSIS — R0689 Other abnormalities of breathing: Secondary | ICD-10-CM | POA: Diagnosis not present

## 2017-04-18 ENCOUNTER — Encounter: Payer: Self-pay | Admitting: Internal Medicine

## 2017-04-18 ENCOUNTER — Ambulatory Visit (INDEPENDENT_AMBULATORY_CARE_PROVIDER_SITE_OTHER): Payer: Medicare Other | Admitting: Internal Medicine

## 2017-04-18 VITALS — BP 136/76 | HR 81 | Temp 97.4°F | Wt 203.0 lb

## 2017-04-18 DIAGNOSIS — N184 Chronic kidney disease, stage 4 (severe): Secondary | ICD-10-CM

## 2017-04-18 DIAGNOSIS — I5032 Chronic diastolic (congestive) heart failure: Secondary | ICD-10-CM

## 2017-04-18 DIAGNOSIS — J849 Interstitial pulmonary disease, unspecified: Secondary | ICD-10-CM

## 2017-04-18 DIAGNOSIS — E1140 Type 2 diabetes mellitus with diabetic neuropathy, unspecified: Secondary | ICD-10-CM | POA: Diagnosis not present

## 2017-04-18 DIAGNOSIS — K222 Esophageal obstruction: Secondary | ICD-10-CM

## 2017-04-18 DIAGNOSIS — I25119 Atherosclerotic heart disease of native coronary artery with unspecified angina pectoris: Secondary | ICD-10-CM | POA: Diagnosis not present

## 2017-04-18 DIAGNOSIS — M059 Rheumatoid arthritis with rheumatoid factor, unspecified: Secondary | ICD-10-CM

## 2017-04-18 LAB — HEMOGLOBIN A1C: Hgb A1c MFr Bld: 8 % — ABNORMAL HIGH (ref 4.6–6.5)

## 2017-04-18 NOTE — Assessment & Plan Note (Signed)
Some ongoing dysphagia but better since dilation

## 2017-04-18 NOTE — Assessment & Plan Note (Signed)
Unclear if DOE/chest pain is pulmonary or cardiac No med changes for now Rarely uses the nitro

## 2017-04-18 NOTE — Assessment & Plan Note (Signed)
He doesn't feel he is any better on the immurnosuppressant Keeps up with specialists

## 2017-04-18 NOTE — Assessment & Plan Note (Signed)
Seeing [name] Likely the cause of ILD as well On cellcept

## 2017-04-18 NOTE — Assessment & Plan Note (Signed)
Recent GFR down a little

## 2017-04-18 NOTE — Assessment & Plan Note (Signed)
This seems to be compensated No med changes indicated

## 2017-04-18 NOTE — Telephone Encounter (Signed)
Last Visit: 11/26/16 Next Visit: 04/25/17  Okay to refill per Dr. Estanislado Pandy

## 2017-04-18 NOTE — Assessment & Plan Note (Signed)
Not a priority with everything else Goal is A1c under 9% Ongoing recalcitrant neuropathy--now at pain clinic

## 2017-04-18 NOTE — Progress Notes (Signed)
Subjective:    Patient ID: [name], male    DOB: Apr 21, 1931, 84 y.o.   MRN: [id number]  HPI Here for follow up of diabetes and multiple other medical conditions  Still having nausea and occasional vomiting (not in 3 weeks) Still doesn't feel good Is on the cellcept--hard to tell if it is helping Ongoing cough Gets SOB at times---- just walking flat for a short distance Will get chest pain then also Currently on heart monitor--then will go back to Dr Johnsie Cancel  Ongoing knee and hand/finger pain Continues with [name]  Swallowing better since esophageal dilation Still gets some dysphagia and has to vomit up food  Checks sugars just once in a while Last was 254---but usually lower than this No sig edema Ongoing pain/numbness in feet--started on pain pill from pain clinic  Current Outpatient Medications on File Prior to Visit  Medication Sig Dispense Refill  . aspirin EC 81 MG tablet Take 81 mg by mouth at bedtime.    . Blood Glucose Monitoring Suppl (ONE TOUCH ULTRA 2) w/Device KIT Use to obtain blood sugar daily. Dx Code E11.40 1 each 0  . carvedilol (COREG) 3.125 MG tablet Take 1 tablet (3.125 mg total) by mouth 2 (two) times daily with a meal. 180 tablet 3  . clopidogrel (PLAVIX) 75 MG tablet TAKE 1 TABLET BY MOUTH DAILY WITH BREAKFAST. 30 tablet 10  . DHA-Vitamin C-Lutein (EYE HEALTH FORMULA PO) Take 1 tablet by mouth daily.     . furosemide (LASIX) 40 MG tablet TAKE 1 TO 2 TABLETS BY MOUTH EVERY DAY. 60 tablet 0  . glucose blood (ONE TOUCH ULTRA TEST) test strip USE TO CHECK BLOOD SUGAR ONCE A DAY Dx Code E11.40 100 each 3  . metFORMIN (GLUCOPHAGE) 1000 MG tablet Take 1 tablet (1,000 mg total) by mouth daily with breakfast. 30 tablet 3  . Multiple Vitamin (MULTIVITAMIN WITH MINERALS) TABS tablet Take 1 tablet by mouth daily.    . mycophenolate (CELLCEPT) 500 MG tablet TAKE 1 TABLET BY MOUTH TWICE A DAY 180 tablet 0  . nitroGLYCERIN (NITROSTAT) 0.4 MG SL tablet  DISSOLVE 1 TABLET UNDER THE TONGUE FOR CHEST PAIN. MAY REPEAT EVERY 5MINUTES UP TO 3 DOSES. IF NO RELIEF, CALL 911** 25 tablet 5  . ondansetron (ZOFRAN) 4 MG tablet TAKE ONE TABLET BY MOUTH EVERY 8 HOURS AS NEEDED FOR NAUSEA OR VOMITING 60 tablet 1  . ONETOUCH DELICA LANCETS 31S MISC     . OXYGEN Inhale into the lungs. Per pt- Uses Oxygen 2 liters at night.    . pantoprazole (PROTONIX) 40 MG tablet Take 1 tablet (40 mg total) by mouth 2 (two) times daily. 60 tablet 3  . sodium chloride 0.9 % infusion Inject 50 mLs into the vein continuous. 1000 mL 0  . sulfamethoxazole-trimethoprim (BACTRIM DS,SEPTRA DS) 800-160 MG tablet TAKE ONE-HALF TABLET BY MOUTH THREE TIMES A WEEK. 6 tablet 2  . traMADol (ULTRAM) 50 MG tablet Take 1 tablet (50 mg total) by mouth every 12 (twelve) hours as needed for severe pain. 60 tablet 1   No current facility-administered medications on file prior to visit.     Allergies  Allergen Reactions  . Doxazosin Mesylate Other (See Comments)    dizziness  . Methocarbamol Rash    Past Medical History:  Diagnosis Date  . Arthritis    osteoarthritis, s/p R TKR, and digits  . CAD (coronary artery disease)    a. s/p CABG (2001)  b. s/p DES  to RCA and cutting POBA to ostial PDA (2013)   c. s/p DES to SVG to OM2 (01/14/14) d. cath: 08/2015 NSTEMI w/ patent LIMA-LAD and 99% stenosis of SVG-OM w/ DES placed. CTO of SVG-RCA and SVG-D1.   [name] Chronic diastolic CHF (congestive heart failure) ([place])    a) 09/13 ECHO- LVEF 35-36%, grade 1 diastolic dysfunction, mild LA dilatation, atrial septal aneurysm, AV mobility restricted, but no sig AS by doppler; b) 09/04/08 ECHO- LVH, ef 60%, mild AS, c. echo 08/2015: EF perserved of 55-60% with inferolateral HK. Mild AS noted.  . Chronic kidney disease, stage III (moderate) ([place])   . Chronic lower back pain   . Colon polyps   . Diverticulosis   . Dyspnea 2009 since July -Sept   05/06/08-CPST-  normal effort, reduced VO2 max 20.5 /65%, reduced at  8.2/ 40%, normal breathing resetvca of 55%, submaximal heart rate response 112/77%, flattened o2 pluse response at peak exercise-12 ml/beat @ 85%, No VQ mismatch abnormalities, All c/w CIRC Limitation  . Enlarged prostate   . Esophageal stricture    a. s/p dilation spring 2010  . GERD (gastroesophageal reflux disease)   . Heart murmur   . Hiatal hernia   . History of kidney stones   . History of PFTs    mixed pattern on spiro. mild restn on lung volumes with near normal DLCO. Pattern can be explained by CABG scar. Fev1 2.2L/73%, ratio 68 (67), TLC 4.7/68%,RV 1.5L/55%,DLCO 79%  . Hyperlipidemia   . Hypertension   . Interstitial lung disease ([name])    NOS  . Overweight (BMI 25.0-29.9)    BMI 29  . RA (rheumatoid arthritis) ([name])    [name]  . Seropositive rheumatoid arthritis ([place])   . Type II diabetes mellitus ([place])     Past Surgical History:  Procedure Laterality Date  . CARDIAC CATHETERIZATION  08/2004   CP- no MI, Cath- small vessell disease   . CARDIAC CATHETERIZATION  12/31/2011   80% distal LM, 100% native LAD, LCx and RCA, 30% prox SVG-OM, SVG-D1 normal, 99% distal, 80% ostial SVG-RCA distal to graft, LIMA-LAD normal; LVEF mildly decreased with posterior basal AK   . CARDIAC CATHETERIZATION  2009   with patent grafts/notes 12/31/2011  . CARDIAC CATHETERIZATION N/A 08/13/2015   Procedure: Left Heart Cath and Cors/Grafts Angiography;  Surgeon: Sherren Mocha, MD;  Location: [facility];  Service: Cardiovascular;  Laterality: N/A;  . CATARACT EXTRACTION W/ INTRAOCULAR LENS  IMPLANT, BILATERAL Bilateral   . CHOLECYSTECTOMY OPEN  11/2003   [name]  . CORONARY ANGIOPLASTY WITH STENT PLACEMENT  01/03/2012   Successful DES to SVG-RCA and cutting balloon angioplasty ostial  PDA   . CORONARY ANGIOPLASTY WITH STENT PLACEMENT  01/14/2014   "1"  . CORONARY ARTERY BYPASS GRAFT  11/1999   CABG X5  . CORONARY STENT PLACEMENT  02/2012   1 stent and balloon  .  ESOPHAGOGASTRODUODENOSCOPY N/A 03/01/2017   Procedure: ESOPHAGOGASTRODUODENOSCOPY (EGD);  Surgeon: Irene Shipper, MD;  Location: [facility];  Service: Endoscopy;  Laterality: N/A;  . ESOPHAGOGASTRODUODENOSCOPY (EGD) WITH ESOPHAGEAL DILATION  2010  . HAND SURGERY    . JOINT REPLACEMENT    . KNEE ARTHROSCOPY Right 2008  . LEFT AND RIGHT HEART CATHETERIZATION WITH CORONARY ANGIOGRAM  12/31/2011   Procedure: LEFT AND RIGHT HEART CATHETERIZATION WITH CORONARY ANGIOGRAM;  Surgeon: Burnell Blanks, MD;  Location: [facility] C[facility] LAB;  Service: Cardiovascular;;  . LEFT AND RIGHT HEART CATHETERIZATION WITH CORONARY ANGIOGRAM N/A 01/14/2014  Procedure: LEFT AND RIGHT HEART CATHETERIZATION WITH CORONARY ANGIOGRAM;  Surgeon: Peter M Martinique, MD;  Location[facility] C[facility] LAB;  Service: Cardiovascular;  Laterality: N/A;  . MALONEY DILATION  03/01/2017   Procedure: Venia Minks DILATION;  Surgeon: Irene Shipper, MD;  Location: [facility];  Service: Endoscopy;;  . PERCUTANEOUS CORONARY INTERVENTION-BALLOON ONLY  01/03/2012   Procedure: PERCUTANEOUS CORONARY INTERVENTION-BALLOON ONLY;  Surgeon: Peter M Martinique, MD;  Location: [facility] CATH LAB;  Service: Cardiovascular;;  . PERCUTANEOUS CORONARY STENT INTERVENTION (PCI-S)  12/31/2011   Procedure: PERCUTANEOUS CORONARY STENT INTERVENTION (PCI-S);  Surgeon: Burnell Blanks, MD;  Location: [facility] CATH LAB;  Service: Cardiovascular;;  . PERCUTANEOUS CORONARY STENT INTERVENTION (PCI-S) N/A 01/03/2012   Procedure: PERCUTANEOUS CORONARY STENT INTERVENTION (PCI-S);  Surgeon: Peter M Martinique, MD;  Location: [facility] CATH LAB;  Service: Cardiovascular;  Laterality: N/A;  . SHOULDER ARTHROSCOPY WITH OPEN ROTATOR CUFF REPAIR AND DISTAL CLAVICLE ACROMINECTOMY Left 02/27/2013   Procedure: LEFT SHOULDER ARTHROSCOPY WITH MINI OPEN ROTATOR CUFF REPAIR AND SUBACROMIAL DECOMPRESSION AND DISTAL CLAVICLE RESECTION;  Surgeon: [name], MD;  Location: [facility];  Service: Orthopedics;  Laterality: Left;    . TOTAL KNEE ARTHROPLASTY Right 03/2010   [name]  . TRIGGER FINGER RELEASE Left 02/27/2013   Procedure: RELEASE TRIGGER FINGER/A-1 PULLEY;  Surgeon: [name], MD;  Location: [facility];  Service: Orthopedics;  Laterality: Left;    Family History  Problem Relation Age of Onset  . COPD Mother   . Heart disease Father   . Heart attack Father   . Diabetes Brother   . Colon cancer Brother 25  . Alcohol abuse Sister   . Stroke Sister     Social History   Socioeconomic History  . Marital status: Married    Spouse name: Not on file  . Number of children: 3  . Years of education: Not on file  . Highest education level: Not on file  Social Needs  . Financial resource strain: Not on file  . Food insecurity - worry: Not on file  . Food insecurity - inability: Not on file  . Transportation needs - medical: Not on file  . Transportation needs - non-medical: Not on file  Occupational History  . Occupation: Designer, jewellery: RETIRED    Comment: retired  Tobacco Use  . Smoking status: Former Smoker    Packs/day: 1.00    Years: 20.00    Pack years: 20.00    Types: Cigarettes    Last attempt to quit: 04/06/1963    Years since quitting: 54.0  . Smokeless tobacco: Former Systems developer    Types: Chew  Substance and Sexual Activity  . Alcohol use: No    Alcohol/week: 0.0 oz    Comment: 01/01/2012 "last alcohol ~ 50 yr ago"  . Drug use: No  . Sexual activity: Not Currently  Other Topics Concern  . Not on file  Social History Narrative   No living will   Requests wife as health care POA   Discussed DNR --he requests this (done 08/29/12)   Not sure about feeding tube---but might accept for some time   Patient lives with wife and daughter in a one story home.  Has 3 children.  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   Review of Systems Appetite is okay Weight stable Sleep is affected by pain pill Chronic constipation    Objective:   Physical Exam   Constitutional: No distress.  Neck: No thyromegaly present.  Cardiovascular: Normal rate, regular rhythm and intact distal pulses. Exam reveals no gallop.  Gr 2/6 aortic systolic murmu  Pulmonary/Chest: Effort normal. He has no wheezes.  Decreased breath sounds with fine bibasilar crackles  Lymphadenopathy:    He has no cervical adenopathy.          Assessment & Plan:

## 2017-04-19 ENCOUNTER — Encounter: Payer: Self-pay | Admitting: *Deleted

## 2017-04-21 ENCOUNTER — Other Ambulatory Visit: Payer: Self-pay | Admitting: Internal Medicine

## 2017-04-25 ENCOUNTER — Other Ambulatory Visit: Payer: Self-pay

## 2017-04-25 ENCOUNTER — Ambulatory Visit (INDEPENDENT_AMBULATORY_CARE_PROVIDER_SITE_OTHER): Payer: Medicare Other | Admitting: Rheumatology

## 2017-04-25 ENCOUNTER — Encounter: Payer: Self-pay | Admitting: Rheumatology

## 2017-04-25 VITALS — BP 132/84 | HR 87 | Resp 18 | Ht 73.0 in | Wt 207.0 lb

## 2017-04-25 DIAGNOSIS — M47816 Spondylosis without myelopathy or radiculopathy, lumbar region: Secondary | ICD-10-CM | POA: Diagnosis not present

## 2017-04-25 DIAGNOSIS — M1712 Unilateral primary osteoarthritis, left knee: Secondary | ICD-10-CM

## 2017-04-25 DIAGNOSIS — E1122 Type 2 diabetes mellitus with diabetic chronic kidney disease: Secondary | ICD-10-CM | POA: Diagnosis not present

## 2017-04-25 DIAGNOSIS — Z8669 Personal history of other diseases of the nervous system and sense organs: Secondary | ICD-10-CM

## 2017-04-25 DIAGNOSIS — I5032 Chronic diastolic (congestive) heart failure: Secondary | ICD-10-CM | POA: Diagnosis not present

## 2017-04-25 DIAGNOSIS — Z96651 Presence of right artificial knee joint: Secondary | ICD-10-CM

## 2017-04-25 DIAGNOSIS — Z79899 Other long term (current) drug therapy: Secondary | ICD-10-CM | POA: Diagnosis not present

## 2017-04-25 DIAGNOSIS — M059 Rheumatoid arthritis with rheumatoid factor, unspecified: Secondary | ICD-10-CM | POA: Diagnosis not present

## 2017-04-25 DIAGNOSIS — Z8679 Personal history of other diseases of the circulatory system: Secondary | ICD-10-CM

## 2017-04-25 DIAGNOSIS — J849 Interstitial pulmonary disease, unspecified: Secondary | ICD-10-CM

## 2017-04-25 DIAGNOSIS — Z8639 Personal history of other endocrine, nutritional and metabolic disease: Secondary | ICD-10-CM | POA: Diagnosis not present

## 2017-04-25 DIAGNOSIS — Z8719 Personal history of other diseases of the digestive system: Secondary | ICD-10-CM

## 2017-04-25 DIAGNOSIS — R110 Nausea: Secondary | ICD-10-CM

## 2017-04-25 DIAGNOSIS — N183 Chronic kidney disease, stage 3 (moderate): Secondary | ICD-10-CM

## 2017-04-25 NOTE — Patient Instructions (Addendum)
  Discontinue CellCept and Bactrim DS    Standing Labs We placed an order today for your standing lab work.    Please come back and get your standing labs in February and every 3 months  We have open lab Monday through Friday from 8:30-11:30 AM and 1:30-4 PM at the office of Dr. [name].   The office is located at [address] [place], [place], [place] [address] No appointment is necessary.   Labs are drawn by [facility].  You may receive a bill from [facility] for your lab work. If you have any questions regarding directions or hours of operation,  please call [phone].

## 2017-04-26 ENCOUNTER — Other Ambulatory Visit: Payer: Self-pay

## 2017-04-26 ENCOUNTER — Ambulatory Visit
Payer: Medicare Other | Attending: Student in an Organized Health Care Education/Training Program | Admitting: Student in an Organized Health Care Education/Training Program

## 2017-04-26 ENCOUNTER — Encounter: Payer: Self-pay | Admitting: Student in an Organized Health Care Education/Training Program

## 2017-04-26 VITALS — BP 151/73 | HR 83 | Temp 97.7°F | Resp 18 | Ht 73.0 in | Wt 208.0 lb

## 2017-04-26 DIAGNOSIS — Z79899 Other long term (current) drug therapy: Secondary | ICD-10-CM | POA: Diagnosis not present

## 2017-04-26 DIAGNOSIS — J9610 Chronic respiratory failure, unspecified whether with hypoxia or hypercapnia: Secondary | ICD-10-CM | POA: Diagnosis not present

## 2017-04-26 DIAGNOSIS — M170 Bilateral primary osteoarthritis of knee: Secondary | ICD-10-CM | POA: Insufficient documentation

## 2017-04-26 DIAGNOSIS — G894 Chronic pain syndrome: Secondary | ICD-10-CM | POA: Diagnosis not present

## 2017-04-26 DIAGNOSIS — Z7982 Long term (current) use of aspirin: Secondary | ICD-10-CM | POA: Insufficient documentation

## 2017-04-26 DIAGNOSIS — K222 Esophageal obstruction: Secondary | ICD-10-CM | POA: Insufficient documentation

## 2017-04-26 DIAGNOSIS — G629 Polyneuropathy, unspecified: Secondary | ICD-10-CM

## 2017-04-26 DIAGNOSIS — M069 Rheumatoid arthritis, unspecified: Secondary | ICD-10-CM | POA: Diagnosis not present

## 2017-04-26 DIAGNOSIS — K439 Ventral hernia without obstruction or gangrene: Secondary | ICD-10-CM | POA: Diagnosis not present

## 2017-04-26 DIAGNOSIS — Z7984 Long term (current) use of oral hypoglycemic drugs: Secondary | ICD-10-CM | POA: Insufficient documentation

## 2017-04-26 DIAGNOSIS — K5790 Diverticulosis of intestine, part unspecified, without perforation or abscess without bleeding: Secondary | ICD-10-CM | POA: Insufficient documentation

## 2017-04-26 DIAGNOSIS — E1142 Type 2 diabetes mellitus with diabetic polyneuropathy: Secondary | ICD-10-CM | POA: Insufficient documentation

## 2017-04-26 DIAGNOSIS — G479 Sleep disorder, unspecified: Secondary | ICD-10-CM | POA: Insufficient documentation

## 2017-04-26 DIAGNOSIS — M79671 Pain in right foot: Secondary | ICD-10-CM | POA: Diagnosis not present

## 2017-04-26 DIAGNOSIS — M059 Rheumatoid arthritis with rheumatoid factor, unspecified: Secondary | ICD-10-CM | POA: Diagnosis not present

## 2017-04-26 DIAGNOSIS — M48061 Spinal stenosis, lumbar region without neurogenic claudication: Secondary | ICD-10-CM | POA: Diagnosis not present

## 2017-04-26 DIAGNOSIS — I350 Nonrheumatic aortic (valve) stenosis: Secondary | ICD-10-CM | POA: Insufficient documentation

## 2017-04-26 DIAGNOSIS — I25119 Atherosclerotic heart disease of native coronary artery with unspecified angina pectoris: Secondary | ICD-10-CM | POA: Insufficient documentation

## 2017-04-26 DIAGNOSIS — Z96651 Presence of right artificial knee joint: Secondary | ICD-10-CM | POA: Insufficient documentation

## 2017-04-26 DIAGNOSIS — N400 Enlarged prostate without lower urinary tract symptoms: Secondary | ICD-10-CM | POA: Diagnosis not present

## 2017-04-26 DIAGNOSIS — I951 Orthostatic hypotension: Secondary | ICD-10-CM | POA: Insufficient documentation

## 2017-04-26 DIAGNOSIS — Z720 Tobacco use: Secondary | ICD-10-CM | POA: Insufficient documentation

## 2017-04-26 DIAGNOSIS — E785 Hyperlipidemia, unspecified: Secondary | ICD-10-CM | POA: Insufficient documentation

## 2017-04-26 DIAGNOSIS — I700 Atherosclerosis of aorta: Secondary | ICD-10-CM | POA: Insufficient documentation

## 2017-04-26 DIAGNOSIS — J849 Interstitial pulmonary disease, unspecified: Secondary | ICD-10-CM | POA: Insufficient documentation

## 2017-04-26 DIAGNOSIS — E1122 Type 2 diabetes mellitus with diabetic chronic kidney disease: Secondary | ICD-10-CM | POA: Insufficient documentation

## 2017-04-26 DIAGNOSIS — I130 Hypertensive heart and chronic kidney disease with heart failure and stage 1 through stage 4 chronic kidney disease, or unspecified chronic kidney disease: Secondary | ICD-10-CM | POA: Insufficient documentation

## 2017-04-26 DIAGNOSIS — N183 Chronic kidney disease, stage 3 unspecified: Secondary | ICD-10-CM

## 2017-04-26 DIAGNOSIS — K219 Gastro-esophageal reflux disease without esophagitis: Secondary | ICD-10-CM | POA: Diagnosis not present

## 2017-04-26 DIAGNOSIS — K210 Gastro-esophageal reflux disease with esophagitis: Secondary | ICD-10-CM | POA: Diagnosis not present

## 2017-04-26 DIAGNOSIS — M79672 Pain in left foot: Secondary | ICD-10-CM | POA: Diagnosis not present

## 2017-04-26 DIAGNOSIS — M47816 Spondylosis without myelopathy or radiculopathy, lumbar region: Secondary | ICD-10-CM | POA: Insufficient documentation

## 2017-04-26 DIAGNOSIS — Z951 Presence of aortocoronary bypass graft: Secondary | ICD-10-CM | POA: Insufficient documentation

## 2017-04-26 DIAGNOSIS — Z955 Presence of coronary angioplasty implant and graft: Secondary | ICD-10-CM | POA: Insufficient documentation

## 2017-04-26 MED ORDER — HYDROCODONE-ACETAMINOPHEN 5-325 MG PO TABS: 1.0000 | ORAL_TABLET | Freq: Two times a day (BID) | ORAL | 0 refills | Status: DC | PRN

## 2017-04-26 NOTE — Progress Notes (Signed)
Safety precautions to be maintained throughout the outpatient stay will include: orient to surroundings, keep bed in low position, maintain call bell within reach at all times, provide assistance with transfer out of bed and ambulation.  

## 2017-04-26 NOTE — Progress Notes (Signed)
Patient's Name: [name]  MRN: [id number]  Referring Provider: [name], MD  DOB: [birth year]  PCP: [name], MD  DOS: 04/26/2017  Note by: Gillis Santa, MD  Service setting: Ambulatory outpatient  Specialty: Interventional Pain Management  Location: [facility] (AMB) Pain Management Facility    Patient type: Established   Primary Reason(s) for Visit: Encounter for prescription drug management. (Level of risk: moderate)  CC: Foot Pain (bilat)  HPI  [name] is a 84 y.o. year old, male patient, who comes today for a medication management evaluation. He has HLD (hyperlipidemia); Obstructive sleep apnea; Essential hypertension; Thoracic aorta atherosclerosis ([place]); Reflux esophagitis; ESOPHAGEAL STRICTURE; GERD; DIVERTICULOSIS-COLON; BENIGN PROSTATIC HYPERTROPHY; ACTINIC KERATOSIS; SLEEP DISORDER, CHRONIC; Nausea; Chronic diastolic heart failure ([name]); ILD (interstitial lung disease) ([place]); CKD (chronic kidney disease) stage 4, GFR 15-29 ml/min ([place]); Routine general medical examination at a health care facility; Seropositive rheumatoid arthritis ([place]); Ventral hernia; DM (diabetes mellitus) type II controlled, neurological manifestation ([place]); Atherosclerotic heart disease of native coronary artery with angina pectoris ([employer]); Respiratory failure, chronic ([place]); Spinal stenosis of lumbar region without neurogenic claudication; Spondylosis without myelopathy or radiculopathy, lumbar region; Tegretol-induced dizziness; Diarrhea; Orthostatic hypotension; High risk medication use; Primary osteoarthritis of both knees; History of right knee joint replacement; Dysphagia; and Esophageal stricture on their problem list. His primarily concern today is the Foot Pain (bilat)  Pain Assessment: Location: Right, Left Foot Radiating: top of foot, sometimes effects toes Onset: More than a month ago Duration: Chronic pain Quality: Aching, Burning, Sharp, Shooting, Discomfort Severity: 10-Worst pain  ever/10 (self-reported pain score)  Note: Reported level is inconsistent with clinical observations. Clinically the patient looks like a 2/10 A 2/10 is viewed as "Mild to Moderate" and described as noticeable and distracting. Impossible to hide from other people. More frequent flare-ups. Still possible to adapt and function close to normal. It can be very annoying and may have occasional stronger flare-ups. With discipline, patients may get used to it and adapt.       When using our objective Pain Scale, levels between 6 and 10/10 are said to belong in an emergency room, as it progressively worsens from a 6/10, described as severely limiting, requiring emergency care not usually available at an outpatient pain management facility. At a 6/10 level, communication becomes difficult and requires great effort. Assistance to reach the emergency department may be required. Facial flushing and profuse sweating along with potentially dangerous increases in heart rate and blood pressure will be evident. Effect on ADL: prolonged walking, standing Timing: Constant Modifying factors: medications  [name] was last scheduled for an appointment on 03/17/2017 for medication management. During today's appointment we reviewed [name] chronic pain status, as well as his outpatient medication regimen.  84 year old male with a history of diabetic polyneuropathy who presents for follow-up.  He states that the tramadol was keeping him awake at night so he stopped his nighttime dose.  He finds a daytime dose somewhat effective.  We will discontinue tramadol and start hydrocodone 5 mg twice daily as needed.  The patient  reports that he does not use drugs. His body mass index is 27.44 kg/m.  Further details on both, my assessment(s), as well as the proposed treatment plan, please see below.  Controlled Substance Pharmacotherapy Assessment REMS (Risk Evaluation and Mitigation Strategy)  Analgesic: Hydrocodone 5 mg  twice daily as needed, quantity 1-monthMME/day: 10 mg/day.  [name] RN  04/26/2017  2:04 PM  Sign at close encounter  Safety precautions to be maintained throughout the outpatient stay will include: orient to surroundings, keep bed in low position, maintain call bell within reach at all times, provide assistance with transfer out of bed and ambulation.  Pharmacokinetics: Liberation and absorption (onset of action): WNL Distribution (time to peak effect): WNL Metabolism and excretion (duration of action): WNL         Pharmacodynamics: Desired effects: Analgesia: [name] reports >50% benefit. Functional ability: Patient reports that medication allows him to accomplish basic ADLs Clinically meaningful improvement in function (CMIF): Sustained CMIF goals met Perceived effectiveness: Described as relatively effective, allowing for increase in activities of daily living (ADL) Undesirable effects: Side-effects or Adverse reactions: None reported Monitoring: [place] PMP: Online review of the past 67-monthperiod conducted. Compliant with practice rules and regulations Last UDS on record: Summary  Date Value Ref Range Status  03/17/2017 FINAL  Final    Comment:    ==================================================================== TOXASSURE COMP DRUG ANALYSIS,UR ==================================================================== Test                             Result       Flag       Units Drug Absent but Declared for Prescription Verification   Tramadol                       Not Detected UNEXPECTED ng/mg creat   Salicylate                     Not Detected UNEXPECTED    Aspirin, as indicated in the declared medication list, is not    always detected even when used as directed. ==================================================================== Test                      Result    Flag   Units      Ref Range   Creatinine              80               mg/dL       >=20 ==================================================================== Declared Medications:  The flagging and interpretation on this report are based on the  following declared medications.  Unexpected results may arise from  inaccuracies in the declared medications.  **Note: The testing scope of this panel includes these medications:  Tramadol  **Note: The testing scope of this panel does not include small to  moderate amounts of these reported medications:  Aspirin (Aspirin 81)  **Note: The testing scope of this panel does not include following  reported medications:  Carvedilol  Cephalexin  Clopidogrel  Furosemide  Metformin  Multivitamin  Mycophenolate mofetil (Cellcept)  Nitroglycerin  Ondansetron  Oxygen  Pantoprazole  Sodium Chloride  Sulfamethoxazole  Trimethoprim ==================================================================== For clinical consultation, please call ([phone] ====================================================================    UDS interpretation: Compliant          Medication Assessment Form: Reviewed. Patient indicates being compliant with therapy Treatment compliance: Compliant Risk Assessment Profile: Aberrant behavior: See prior evaluations. None observed or detected today Comorbid factors increasing risk of overdose: See prior notes. No additional risks detected today Risk of substance use disorder (SUD): Low Opioid Risk Tool - 04/26/17 1409      Family History of Substance Abuse   Alcohol  Positive Male    Illegal Drugs  Positive Male    Rx Drugs  Positive Male or Male  Personal History of Substance Abuse   Alcohol  Negative    Illegal Drugs  Negative    Rx Drugs  Negative      Total Score   Opioid Risk Tool Scoring  10    Opioid Risk Interpretation  High Risk      ORT Scoring interpretation table:  Score <3 = Low Risk for SUD  Score between 4-7 = Moderate Risk for SUD  Score >8 = High Risk for Opioid  Abuse   Risk Mitigation Strategies:  Patient Counseling: Covered Patient-Prescriber Agreement (PPA): Present and active  Notification to other healthcare providers: Done  Pharmacologic Plan: Transition to hydrocodone 5 mill grams twice daily as needed, quantity 66-monthgiven insomnia associated with tramadol             Laboratory Chemistry  Inflammation Markers (CRP: Acute Phase) (ESR: Chronic Phase) Lab Results  Component Value Date   ESRSEDRATE 38 (H) 11/22/2011                 Rheumatology Markers Lab Results  Component Value Date   RF 23 (H) 11/22/2011   ANA NEG 11/22/2011                Renal Function Markers Lab Results  Component Value Date   BUN 21 04/08/2017   CREATININE 2.28 (H) 04/08/2017   GFRAA 30 (L) 04/08/2017   GFRNONAA 26 (L) 04/08/2017                 Hepatic Function Markers Lab Results  Component Value Date   AST 14 01/25/2017   ALT 9 01/25/2017   ALBUMIN 3.8 11/23/2016   ALKPHOS 73 11/23/2016                 Electrolytes Lab Results  Component Value Date   NA 139 04/08/2017   K 4.4 04/08/2017   CL 96 04/08/2017   CALCIUM 10.1 04/08/2017   MG 1.9 04/08/2017   PHOS 3.6 04/08/2016                 Neuropathy Markers Lab Results  Component Value Date   VITAMINB12 253 08/10/2016   FOLATE 17.6 08/10/2016   HGBA1C 8.0 (H) 04/18/2017                 Bone Pathology Markers No results found for: VD25OH, VD125OH2TOT, [id number] [id number] 25OHVITD1, 25OHVITD2, 25OHVITD3, TESTOFREE, TESTOSTERONE               Coagulation Parameters Lab Results  Component Value Date   INR 1.12 08/13/2015   LABPROT 14.6 08/13/2015   APTT 29 08/13/2015   PLT 329 04/08/2017                 Cardiovascular Markers Lab Results  Component Value Date   BNP 318.4 (H) 08/13/2015   CKTOTAL 73 12/31/2011   CKMB 3.4 12/31/2011   TROPONINI 0.68 (HH) 08/14/2015   HGB 8.8 (L) 04/08/2017   HCT 28.5 (L) 04/08/2017                 CA Markers No results found  for: CEA, CA125, LABCA2               Note: Lab results reviewed.  Recent Diagnostic Imaging Results  ECHOCARDIOGRAM COMPLETE                         *[facility]*  [address] [place], [place] [address]                            [phone]  ------------------------------------------------------------------- Transthoracic Echocardiography  Patient:    [name], [name] MR #:       [id number] Study Date: 01/17/2017 Gender:     M Age:        51 Height:     185.4 cm Weight:     94.3 kg BSA:        2.22 m^2 Pt. Status: Room:   Jetty Duhamel, M.D.  REFERRING    [name], M.D.  ATTENDING    Mertie Moores, M.D.  SONOGRAPHER  Oletta Lamas, Will  PERFORMING   [facility], Outpatient  cc:  ------------------------------------------------------------------- LV EF: 60% -   65%  ------------------------------------------------------------------- Indications:      (I35.0).  ------------------------------------------------------------------- History:   PMH:  Acquired from the patient and from the patient&'s chart.  Congestive heart failure.  Mild aortic stenosis.  Risk factors:  Former tobacco use. Hypertension. Diabetes mellitus. Dyslipidemia.  ------------------------------------------------------------------- Study Conclusions  - Left ventricle: The cavity size was normal. Wall thickness was   increased in a pattern of mild LVH. Systolic function was normal.   The estimated ejection fraction was in the range of 60% to 65%.   Akinesis of the basal-midinferior myocardium. Doppler parameters   are consistent with abnormal left ventricular relaxation (grade 1   diastolic dysfunction). - Aortic valve: Moderately calcified annulus. Moderately thickened   leaflets. There was mild to moderate stenosis. There was mild   regurgitation.  ------------------------------------------------------------------- Study data:   Comparison was made to the study of 01/13/2016.  Study status:  Routine.  Procedure:  The patient reported no pain pre or post test. Transthoracic echocardiography for left ventricular function evaluation and for assessment of valvular function. Image quality was adequate.  Study completion:  There were no complications.          Transthoracic echocardiography.  M-mode, complete 2D, spectral Doppler, and color Doppler.  Birthdate: Patient birthdate: [birth year].  Age:  Patient is 84 yr old.  Sex: Gender: male.    BMI: 27.4 kg/m^2.  Blood pressure:     146/88 Patient status:  Outpatient.  Study date:  Study date: 01/17/2017. Study time: 10:48 AM.  Location:  [facility]  -------------------------------------------------------------------  ------------------------------------------------------------------- Left ventricle:  The cavity size was normal. Wall thickness was increased in a pattern of mild LVH. Systolic function was normal. The estimated ejection fraction was in the range of 60% to 65%. Regional wall motion abnormalities:   Akinesis of the basal-midinferior myocardium. Doppler parameters are consistent with abnormal left ventricular relaxation (grade 1 diastolic dysfunction).  ------------------------------------------------------------------- Aortic valve:   Moderately calcified annulus. Moderately thickened leaflets.  Doppler:   There was mild to moderate stenosis.   There was mild regurgitation.    VTI ratio of LVOT to aortic valve: 0.3. Valve area (VTI): 1.6 cm^2. Indexed valve area (VTI): 0.72 cm^2/m^2. Peak velocity ratio of LVOT to aortic valve: 0.31. Valve area (Vmax): 1.63 cm^2. Indexed valve area (Vmax): 0.73 cm^2/m^2. Mean velocity ratio of LVOT to aortic valve: 0.29. Valve area (Vmean): 1.52 cm^2. Indexed valve area (Vmean): 0.69 cm^2/m^2. Mean gradient (S): 18 mm Hg. Peak gradient (S): 32 mm  Hg.  -------------------------------------------------------------------  Aorta:  Aortic root: The aortic root was normal in size. Ascending aorta: The ascending aorta was normal in size.  ------------------------------------------------------------------- Mitral valve:   Structurally normal valve.   Leaflet separation was normal.  Doppler:  Transvalvular velocity was within the normal range. There was no evidence for stenosis. There was trivial regurgitation.  ------------------------------------------------------------------- Left atrium:  The atrium was normal in size.  ------------------------------------------------------------------- Right ventricle:  The cavity size was normal. Systolic function was normal.  ------------------------------------------------------------------- Pulmonic valve:    The valve appears to be grossly normal. Doppler:  There was no significant regurgitation.  ------------------------------------------------------------------- Tricuspid valve:   Structurally normal valve.   Leaflet separation was normal.  Doppler:  Transvalvular velocity was within the normal range. There was no regurgitation.  ------------------------------------------------------------------- Right atrium:  The atrium was normal in size.  ------------------------------------------------------------------- Pericardium:  There was no pericardial effusion.  ------------------------------------------------------------------- Measurements   Left ventricle                            Value          Reference  LV ID, ED, PLAX chordal                   51.5  mm       43 - 52  LV ID, ES, PLAX chordal                   36.7  mm       23 - 38  LV fx shortening, PLAX chordal            29    %        >=29  LV PW thickness, ED                       11    mm       ---------  IVS/LV PW ratio, ED                       1.25           <=1.3  Stroke volume, 2D                         92    ml        ---------  Stroke volume/bsa, 2D                     41    ml/m^2   ---------  LV ejection fraction, 1-p A4C             50    %        ---------  LV end-diastolic volume, 2-p              123   ml       ---------  LV end-systolic volume, 2-p               55    ml       ---------  LV ejection fraction, 2-p                 55    %        ---------  Stroke volume, 2-p  68    ml       ---------  LV end-diastolic volume/bsa, 2-p          55    ml/m^2   ---------  LV end-systolic volume/bsa, 2-p           25    ml/m^2   ---------  Stroke volume/bsa, 2-p                    30.7  ml/m^2   ---------  LV e&', lateral                            5.07  cm/s     ---------  LV E/e&', lateral                          13.93          ---------  LV e&', medial                             3.8   cm/s     ---------  LV E/e&', medial                           18.58          ---------  LV e&', average                            4.44  cm/s     ---------  LV E/e&', average                          15.92          ---------    Ventricular septum                        Value          Reference  IVS thickness, ED                         13.8  mm       ---------    LVOT                                      Value          Reference  LVOT ID, S                                26    mm       ---------  LVOT area                                 5.31  cm^2     ---------  LVOT ID                                   26    mm       ---------  LVOT peak velocity, S  86.5  cm/s     ---------  LVOT mean velocity, S                     55    cm/s     ---------  LVOT VTI, S                               17.4  cm       ---------  LVOT peak gradient, S                     3     mm Hg    ---------  Stroke volume (SV), LVOT DP               92.4  ml       ---------  Stroke index (SV/bsa), LVOT DP            41.6  ml/m^2   ---------    Aortic valve                              Value           Reference  Aortic valve peak velocity, S             282   cm/s     ---------  Aortic valve mean velocity, S             192   cm/s     ---------  Aortic valve VTI, S                       57.8  cm       ---------  Aortic mean gradient, S                   18    mm Hg    ---------  Aortic peak gradient, S                   32    mm Hg    ---------  VTI ratio, LVOT/AV                        0.3            ---------  Aortic valve area, VTI                    1.6   cm^2     ---------  Aortic valve area/bsa, VTI                0.72  cm^2/m^2 ---------  Velocity ratio, peak, LVOT/AV             0.31           ---------  Aortic valve area, peak velocity          1.63  cm^2     ---------  Aortic valve area/bsa, peak               0.73  cm^2/m^2 ---------  velocity  Velocity ratio, mean, LVOT/AV             0.29           ---------  Aortic valve area, mean velocity          1.52  cm^2     ---------  Aortic valve area/bsa, mean               0.69  cm^2/m^2 ---------  velocity  Aortic regurg pressure half-time          488   ms       ---------    Aorta                                     Value          Reference  Aortic root ID, ED                        35    mm       ---------  Ascending aorta ID, A-P, S                34    mm       ---------    Left atrium                               Value          Reference  LA ID, A-P, ES                            39    mm       ---------  LA ID/bsa, A-P                            1.76  cm/m^2   <=2.2  LA volume, S                              72    ml       ---------  LA volume/bsa, S                          32.5  ml/m^2   ---------  LA volume, ES, 1-p A4C                    73    ml       ---------  LA volume/bsa, ES, 1-p A4C                32.9  ml/m^2   ---------  LA volume, ES, 1-p A2C                    73    ml       ---------  LA volume/bsa, ES, 1-p A2C                32.9  ml/m^2   ---------    Mitral valve                              Value           Reference  Mitral E-wave peak velocity               70.6  cm/s     ---------  Mitral A-wave peak velocity  103   cm/s     ---------  Mitral deceleration time          (H)     352   ms       150 - 230  Mitral E/A ratio, peak                    0.7            ---------    Pulmonary arteries                        Value          Reference  PA pressure, S, DP                        28    mm Hg    <=30    Tricuspid valve                           Value          Reference  Tricuspid regurg peak velocity            248   cm/s     ---------  Tricuspid peak RV-RA gradient             25    mm Hg    ---------    Systemic veins                            Value          Reference  Estimated CVP                             3     mm Hg    ---------    Right ventricle                           Value          Reference  RV pressure, S, DP                        28    mm Hg    <=30  RV s&', lateral, S                         11.2  cm/s     ---------  Legend: (L)  and  (H)  mark values outside specified reference range.  ------------------------------------------------------------------- Prepared and Electronically Authenticated by  Mertie Moores, M.D. 2018-10-15T14:57:14  Complexity Note: Imaging results reviewed. Results shared with [name], using Layman's terms.                         Meds   Current Outpatient Medications:  .  aspirin EC 81 MG tablet, Take 81 mg by mouth at bedtime., Disp: , Rfl:  .  Blood Glucose Monitoring Suppl (ONE TOUCH ULTRA 2) w/Device KIT, Use to obtain blood sugar daily. Dx Code E11.40, Disp: 1 each, Rfl: 0 .  carvedilol (COREG) 3.125 MG tablet, Take 1 tablet (3.125 mg total) by mouth 2 (two) times daily with a meal., Disp: 180 tablet, Rfl: 3 .  cholecalciferol (VITAMIN D) 1000 units tablet,  Take 1,000 Units by mouth daily., Disp: , Rfl:  .  clopidogrel (PLAVIX) 75 MG tablet, TAKE 1 TABLET BY MOUTH DAILY WITH BREAKFAST., Disp: 30 tablet,  Rfl: 10 .  DHA-Vitamin C-Lutein (EYE HEALTH FORMULA PO), Take 1 tablet by mouth daily. , Disp: , Rfl:  .  furosemide (LASIX) 40 MG tablet, TAKE 1 TO 2 TABLETS BY MOUTH EVERY DAY., Disp: 60 tablet, Rfl: 0 .  glucose blood (ONE TOUCH ULTRA TEST) test strip, USE TO CHECK BLOOD SUGAR ONCE A DAY Dx Code E11.40, Disp: 100 each, Rfl: 3 .  metFORMIN (GLUCOPHAGE) 1000 MG tablet, Take 1 tablet (1,000 mg total) by mouth daily with breakfast., Disp: 30 tablet, Rfl: 3 .  Multiple Vitamin (MULTIVITAMIN WITH MINERALS) TABS tablet, Take 1 tablet by mouth daily., Disp: , Rfl:  .  nitroGLYCERIN (NITROSTAT) 0.4 MG SL tablet, DISSOLVE 1 TABLET UNDER THE TONGUE FOR CHEST PAIN. MAY REPEAT EVERY 5MINUTES UP TO 3 DOSES. IF NO RELIEF, CALL 911**, Disp: 25 tablet, Rfl: 5 .  ondansetron (ZOFRAN) 4 MG tablet, TAKE ONE TABLET BY MOUTH EVERY 8 HOURS AS NEEDED FOR NAUSEA OR VOMITING, Disp: 60 tablet, Rfl: 1 .  ONETOUCH DELICA LANCETS 56L MISC, , Disp: , Rfl:  .  OXYGEN, Inhale into the lungs. Per pt- Uses Oxygen 2 liters at night., Disp: , Rfl:  .  pantoprazole (PROTONIX) 40 MG tablet, TAKE 1 TABLET BY MOUTH TWICE A DAY, Disp: 60 tablet, Rfl: 0 .  sodium chloride 0.9 % infusion, Inject 50 mLs into the vein continuous., Disp: 1000 mL, Rfl: 0 .  HYDROcodone-acetaminophen (NORCO/VICODIN) 5-325 MG tablet, Take 1 tablet by mouth 2 (two) times daily as needed for moderate pain., Disp: 60 tablet, Rfl: 0 .  mycophenolate (CELLCEPT) 500 MG tablet, TAKE 1 TABLET BY MOUTH TWICE A DAY (Patient not taking: Reported on 04/26/2017), Disp: 180 tablet, Rfl: 0 .  sulfamethoxazole-trimethoprim (BACTRIM DS,SEPTRA DS) 800-160 MG tablet, TAKE ONE-HALF TABLET BY MOUTH THREE TIMES A WEEK. (Patient not taking: Reported on 04/26/2017), Disp: 6 tablet, Rfl: 2  ROS  Constitutional: Denies any fever or chills Gastrointestinal: No reported hemesis, hematochezia, vomiting, or acute GI distress Musculoskeletal: Denies any acute onset joint swelling, redness,  loss of ROM, or weakness Neurological: No reported episodes of acute onset apraxia, aphasia, dysarthria, agnosia, amnesia, paralysis, loss of coordination, or loss of consciousness  Allergies  [name] is allergic to doxazosin mesylate and methocarbamol.  [place]  Drug: [name]  reports that he does not use drugs. Alcohol:  reports that he does not drink alcohol. Tobacco:  reports that he quit smoking about 54 years ago. His smoking use included cigarettes. He has a 20.00 pack-year smoking history. He has quit using smokeless tobacco. His smokeless tobacco use included chew. Medical:  has a past medical history of Arthritis, CAD (coronary artery disease), Chronic diastolic CHF (congestive heart failure) ([place]), Chronic kidney disease, stage III (moderate) ([place]), Chronic lower back pain, Colon polyps, Diverticulosis, Dyspnea (2009 since July -Sept), Enlarged prostate, Esophageal stricture, GERD (gastroesophageal reflux disease), Heart murmur, Hiatal hernia, History of kidney stones, History of PFTs, Hyperlipidemia, Hypertension, Interstitial lung disease ([place]), Nausea & vomiting, Overweight (BMI 25.0-29.9), RA (rheumatoid arthritis) ([place]), Seropositive rheumatoid arthritis ([place]), and Type II diabetes mellitus ([place]). Surgical: Mr. Gautreau  has a past surgical history that includes Total knee arthroplasty (Right, 03/2010); Knee arthroscopy (Right, 2008); Cataract extraction w/ intraocular lens  implant, bilateral (Bilateral); Coronary artery bypass graft (11/1999); Coronary stent placement (02/2012); Shoulder arthroscopy with open  rotator cuff repair and distal clavicle acrominectomy (Left, 02/27/2013); Trigger finger release (Left, 02/27/2013); Cholecystectomy open (11/2003); Joint replacement; Esophagogastroduodenoscopy (egd) with esophageal dilation (2010); Cardiac catheterization (08/2004); Cardiac catheterization (12/31/2011); Coronary angioplasty with stent (01/03/2012); Coronary angioplasty with stent  (01/14/2014); Cardiac catheterization (2009); left and right heart catheterization with coronary angiogram (12/31/2011); percutaneous coronary stent intervention (pci-s) (12/31/2011); percutaneous coronary stent intervention (pci-s) (N/A, 01/03/2012); Percutaneous coronary intervention-balloon only (01/03/2012); left and right heart catheterization with coronary angiogram (N/A, 01/14/2014); Hand surgery; Cardiac catheterization (N/A, 08/13/2015); Esophagogastroduodenoscopy (N/A, 03/01/2017); and maloney dilation (03/01/2017). Family: family history includes Alcohol abuse in his sister; COPD in his mother; Colon cancer (age of onset: 39) in his brother; Diabetes in his brother; Heart attack in his father; Heart disease in his father; Stroke in his sister.  Constitutional Exam  General appearance: Well nourished, well developed, and well hydrated. In no apparent acute distress Vitals:   04/26/17 1400  BP: (!) 151/73  Pulse: 83  Resp: 18  Temp: 97.7 F (36.5 C)  SpO2: 100%  Weight: 208 lb (94.3 kg)  Height: '6\' 1"'$  (1.854 m)   BMI Assessment: Estimated body mass index is 27.44 kg/m as calculated from the following:   Height as of this encounter: '6\' 1"'$  (1.854 m).   Weight as of this encounter: 208 lb (94.3 kg).  BMI interpretation table: BMI level Category Range association with higher incidence of chronic pain  <18 kg/m2 Underweight   18.5-24.9 kg/m2 Ideal body weight   25-29.9 kg/m2 Overweight Increased incidence by 20%  30-34.9 kg/m2 Obese (Class I) Increased incidence by 68%  35-39.9 kg/m2 Severe obesity (Class II) Increased incidence by 136%  >40 kg/m2 Extreme obesity (Class III) Increased incidence by 254%   BMI Readings from Last 4 Encounters:  04/26/17 27.44 kg/m  04/25/17 27.31 kg/m  04/18/17 26.78 kg/m  04/08/17 27.05 kg/m   Wt Readings from Last 4 Encounters:  04/26/17 208 lb (94.3 kg)  04/25/17 207 lb (93.9 kg)  04/18/17 203 lb (92.1 kg)  04/08/17 205 lb (93 kg)   Psych/Mental status: Alert, oriented x 3 (person, place, & time)       Eyes: PERLA Respiratory: No evidence of acute respiratory distress  Cervical Spine Area Exam  Skin & Axial Inspection: No masses, redness, edema, swelling, or associated skin lesions Alignment: Symmetrical Functional ROM: Unrestricted ROM      Stability: No instability detected Muscle Tone/Strength: Functionally intact. No obvious neuro-muscular anomalies detected. Sensory (Neurological): Unimpaired Palpation: No palpable anomalies              Upper Extremity (UE) Exam    Side: Right upper extremity  Side: Left upper extremity  Skin & Extremity Inspection: Skin color, temperature, and hair growth are WNL. No peripheral edema or cyanosis. No masses, redness, swelling, asymmetry, or associated skin lesions. No contractures.  Skin & Extremity Inspection: Skin color, temperature, and hair growth are WNL. No peripheral edema or cyanosis. No masses, redness, swelling, asymmetry, or associated skin lesions. No contractures.  Functional ROM: Unrestricted ROM          Functional ROM: Unrestricted ROM          Muscle Tone/Strength: Functionally intact. No obvious neuro-muscular anomalies detected.  Muscle Tone/Strength: Functionally intact. No obvious neuro-muscular anomalies detected.  Sensory (Neurological): Unimpaired          Sensory (Neurological): Unimpaired          Palpation: No palpable anomalies  Palpation: No palpable anomalies              Specialized Test(s): Deferred         Specialized Test(s): Deferred          Thoracic Spine Area Exam  Skin & Axial Inspection: No masses, redness, or swelling Alignment: Symmetrical Functional ROM: Unrestricted ROM Stability: No instability detected Muscle Tone/Strength: Functionally intact. No obvious neuro-muscular anomalies detected. Sensory (Neurological): Unimpaired Muscle strength & Tone: No palpable anomalies  Lumbar Spine Area Exam  Skin & Axial  Inspection: No masses, redness, or swelling Alignment: Symmetrical Functional ROM: Unrestricted ROM      Stability: No instability detected Muscle Tone/Strength: Functionally intact. No obvious neuro-muscular anomalies detected. Sensory (Neurological): Unimpaired Palpation: No palpable anomalies       Provocative Tests: Lumbar Hyperextension and rotation test: evaluation deferred today       Lumbar Lateral bending test: evaluation deferred today       Patrick's Maneuver: evaluation deferred today                    Gait & Posture Assessment  Ambulation: Unassisted Gait: Relatively normal for age and body habitus Posture: WNL   Lower Extremity Exam    Side: Right lower extremity  Side: Left lower extremity  Skin & Extremity Inspection: Skin color, temperature, and hair growth are WNL. No peripheral edema or cyanosis. No masses, redness, swelling, asymmetry, or associated skin lesions. No contractures.  Skin & Extremity Inspection: Skin color, temperature, and hair growth are WNL. No peripheral edema or cyanosis. No masses, redness, swelling, asymmetry, or associated skin lesions. No contractures.  Functional ROM: Unrestricted ROM          Functional ROM: Unrestricted ROM          Muscle Tone/Strength: Functionally intact. No obvious neuro-muscular anomalies detected.  Muscle Tone/Strength: Functionally intact. No obvious neuro-muscular anomalies detected.  Sensory (Neurological): Unimpaired  Sensory (Neurological): Unimpaired  Palpation: No palpable anomalies  Palpation: No palpable anomalies   Assessment  Primary Diagnosis & Pertinent Problem List: The primary encounter diagnosis was Diabetic polyneuropathy associated with type 2 diabetes mellitus ([place]). Diagnoses of Neuropathy, Seropositive rheumatoid arthritis ([place]), CKD stage 3 due to type 2 diabetes mellitus ([place]), and Chronic pain syndrome were also pertinent to this visit.  Status Diagnosis  Persistent Persistent Persistent 1.  Diabetic polyneuropathy associated with type 2 diabetes mellitus ([place])   2. Neuropathy   3. Seropositive rheumatoid arthritis ([place])   4. CKD stage 3 due to type 2 diabetes mellitus ([place])   5. Chronic pain syndrome      General Recommendations: The pain condition that the patient suffers from is best treated with a multidisciplinary approach that involves an increase in physical activity to prevent de-conditioning and worsening of the pain cycle, as well as psychological counseling (formal and/or informal) to address the co-morbid psychological affects of pain. Treatment will often involve judicious use of pain medications and interventional procedures to decrease the pain, allowing the patient to participate in the physical activity that will ultimately produce long-lasting pain reductions. The goal of the multidisciplinary approach is to return the patient to a higher level of overall function and to restore their ability to perform activities of daily living.  84 year old male with a history of coronary artery disease, GERD, interstitial lung disease, rheumatoid arthritis, aortic stenosis, chronic kidney disease stage III who presents with lower extremity neuropathic pain secondary to diabetic polyneuropathy.  Patient's diagnosis of diabetes was over  10-15 years ago.  He is not on insulin.  He describes burning and tingling sensation in his toes.  This is worsened over time.  This is progressed to the dorsum of his foot.  Patient has been tried on various medications including desipramine, lidocaine cream, gabapentin at a dose of 900 mill grams in the morning, 600 mg in the afternoon, 600 mg at bedtime.  This dose had to be reduced secondary to his chronic kidney disease.  Patient was also tried on Lyrica 100 mill grams twice daily but it was too expensive for the patient to afford.  Furthermore Lyrica resulted in leg swelling and further dose escalation was limited by patient's chronic kidney disease  stage III.  It was not very effective for his neuropathic pain symptoms.  Patient also has tried carbamazepine which was not effective for his neuropathic pain.  Patient has also had injections in his foot for his pain symptoms which were not effective.  Patient is not on any opioid therapy.  Since patient has tried a myriad of neuropathic agents including first, second and third line medications, I have little to offer him in terms of non-opioid analgesics that he has not tried.  Since the patient had insomnia with tramadol, we will trial hydrocodone 5 mg twice daily as needed, quantity 31-month  Have instructed patient to discontinue his tramadol.    Plan of Care  Pharmacotherapy (Medications Ordered): Meds ordered this encounter  Medications  . HYDROcodone-acetaminophen (NORCO/VICODIN) 5-325 MG tablet    Sig: Take 1 tablet by mouth 2 (two) times daily as needed for moderate pain.    Dispense:  60 tablet    Refill:  0    Do not place this medication, or any other prescription from our practice, on "Automatic Refill". Patient may have prescription filled one day early if pharmacy is closed on scheduled refill date.  For chronic pain: to last for 30 days from fill date   Time Note: Greater than 50% of the 25 minute(s) of face-to-face time spent with [name] was spent in counseling/coordination of care regarding: the treatment plan, treatment alternatives, medication side effects, the opioid analgesic risks and possible complications, the appropriate use of his medications and the medication agreement.  Provider-requested follow-up: Return in about 4 weeks (around 05/24/2017) for Medication Management.  Future Appointments  Date Time Provider DBon Air 04/29/2017 11:00 AM RBrand Males MD [facility] None  05/12/2017  2:15 PM [facility]pper [facility] 05/20/2017 11:00 AM NJosue Hector MD [facility]  05/24/2017 12:15 PM LGillis Santa MD  [facility] None  06/24/2017 10:00 AM DBo Merino MD PR-PR None  10/17/2017 11:00 AM LVenia Carbon MD [facility]    Primary Care Physician: LVenia Carbon [facility] Note by: BGillis Santa M.D Date: 04/26/2017; Time: 3:26 PM  There are no Patient Instructions on file for this visit.

## 2017-04-27 ENCOUNTER — Ambulatory Visit: Payer: Medicare Other | Admitting: Physician Assistant

## 2017-04-27 DIAGNOSIS — L570 Actinic keratosis: Secondary | ICD-10-CM | POA: Diagnosis not present

## 2017-04-27 DIAGNOSIS — L820 Inflamed seborrheic keratosis: Secondary | ICD-10-CM | POA: Diagnosis not present

## 2017-04-29 ENCOUNTER — Encounter: Payer: Self-pay | Admitting: Internal Medicine

## 2017-04-29 ENCOUNTER — Other Ambulatory Visit (INDEPENDENT_AMBULATORY_CARE_PROVIDER_SITE_OTHER): Payer: Medicare Other

## 2017-04-29 ENCOUNTER — Ambulatory Visit: Payer: Medicare Other | Admitting: Internal Medicine

## 2017-04-29 VITALS — BP 134/76 | HR 71 | Ht 73.0 in | Wt 204.8 lb

## 2017-04-29 DIAGNOSIS — R0600 Dyspnea, unspecified: Secondary | ICD-10-CM | POA: Diagnosis not present

## 2017-04-29 DIAGNOSIS — R112 Nausea with vomiting, unspecified: Secondary | ICD-10-CM

## 2017-04-29 DIAGNOSIS — N189 Chronic kidney disease, unspecified: Secondary | ICD-10-CM

## 2017-04-29 DIAGNOSIS — Z5181 Encounter for therapeutic drug level monitoring: Secondary | ICD-10-CM

## 2017-04-29 DIAGNOSIS — J849 Interstitial pulmonary disease, unspecified: Secondary | ICD-10-CM | POA: Diagnosis not present

## 2017-04-29 DIAGNOSIS — D649 Anemia, unspecified: Secondary | ICD-10-CM

## 2017-04-29 LAB — CBC WITH DIFFERENTIAL/PLATELET
BASOS ABS: 0.1 10*3/uL (ref 0.0–0.1)
Basophils Relative: 1.4 % (ref 0.0–3.0)
EOS ABS: 0.5 10*3/uL (ref 0.0–0.7)
Eosinophils Relative: 5.5 % — ABNORMAL HIGH (ref 0.0–5.0)
HEMATOCRIT: 28 % — AB (ref 39.0–52.0)
Hemoglobin: 8.8 g/dL — ABNORMAL LOW (ref 13.0–17.0)
LYMPHS PCT: 19.6 % (ref 12.0–46.0)
Lymphs Abs: 2 10*3/uL (ref 0.7–4.0)
MCHC: 31.4 g/dL (ref 30.0–36.0)
MCV: 67.3 fl — ABNORMAL LOW (ref 78.0–100.0)
MONOS PCT: 9.1 % (ref 3.0–12.0)
Monocytes Absolute: 0.9 10*3/uL (ref 0.1–1.0)
NEUTROS ABS: 6.5 10*3/uL (ref 1.4–7.7)
NEUTROS PCT: 64.4 % (ref 43.0–77.0)
PLATELETS: 304 10*3/uL (ref 150.0–400.0)
RBC: 4.15 Mil/uL — AB (ref 4.22–5.81)
RDW: 18.5 % — ABNORMAL HIGH (ref 11.5–15.5)
WBC: 10 10*3/uL (ref 4.0–10.5)

## 2017-04-29 LAB — HEPATIC FUNCTION PANEL
ALBUMIN: 4.2 g/dL (ref 3.5–5.2)
ALK PHOS: 67 U/L (ref 39–117)
ALT: 9 U/L (ref 0–53)
AST: 12 U/L (ref 0–37)
Bilirubin, Direct: 0.1 mg/dL (ref 0.0–0.3)
TOTAL PROTEIN: 8.1 g/dL (ref 6.0–8.3)
Total Bilirubin: 0.7 mg/dL (ref 0.2–1.2)

## 2017-04-29 LAB — BASIC METABOLIC PANEL
BUN: 31 mg/dL — ABNORMAL HIGH (ref 6–23)
CALCIUM: 9.7 mg/dL (ref 8.4–10.5)
CO2: 31 meq/L (ref 19–32)
CREATININE: 2.29 mg/dL — AB (ref 0.40–1.50)
Chloride: 96 mEq/L (ref 96–112)
GFR: 29.1 mL/min — AB (ref >60.00)
Glucose, Bld: 185 mg/dL — ABNORMAL HIGH (ref 70–99)
Potassium: 4.5 mEq/L (ref 3.5–5.1)
SODIUM: 137 meq/L (ref 135–145)

## 2017-04-29 NOTE — Patient Instructions (Addendum)
ICD-10-CM   1. ILD (interstitial lung disease) ([place]) J84.9   2. Dyspnea, unspecified type R06.00   3. Anemia, unspecified type D64.9   4. Chronic kidney disease, unspecified CKD stage N18.9   5. Nausea and vomiting, intractability of vomiting not specified, unspecified vomiting type R11.2   6. Encounter for therapeutic drug monitoring Z51.81    Glad bettter nause and vomit after cellcept stopped 04/25/17 Noticed early in jan 2019 you are more anemic and kidneys worse Unable to resolve your shortness of breath which is multifactorial  Plan - no more cellcept and bactrim - check cbc, bmet and lft  Folllowup 3 months or sooner if needed  - simple (not 6 min) walk test in office at followup

## 2017-04-29 NOTE — Progress Notes (Signed)
Subjective:     Patient ID: [name], male   DOB: March 02, 1932, 84 y.o.   MRN: [id number]  HPI  84 yo male former smoker with RA-ILD   TEST     - Coronary artery disease. s/p CABG x 5 in 2002.  Dyspnea started following lopressor Jan-July 2009 and lisinopril increae July 2009. No relief despite stopping agents July 2010  -   Myoview on April 17, 2007 was  nonischemic with an EF of 51% and inferobasal wall scar.   - CAth Oct 2009:    - . Severe native three-vessel coronary artery disease. 2. Status post multivessel coronary bypass surgery with all grafts  patent. Normal LVEF and LVEDP - PFT - Mixed pattern on spiro. Mild restn on lung volumeswith near normal DLCO.   - Fev1 2.2L/73%, FVC 3.21L/70%, Ratio 68 (67),  TLC 4.7L/68%, RV 1.5L/55%, DLCO 79%.  -CPST 05/06/2008: Normal effot.    - Reduced VO2 max 20.5/65%, REduced AT 8.2/40%, Normal breathing reservce of 55%, submaximal heart rate response 112/77%,  flattened O2 pulse response at peak exercise - 17m/beat at 85%. No VQ mismatch abnormalities. - ECHO 09/04/2008: LVH, ef 60%, mild AS and unchanged from prior   - CT chest   - Non specific Interstitial Lung disease NOS. Stable pulm infilratates RLL ggo > LLL ggo. 03/2008 -> 10/2008 -> June 2011: stable - Rehab: never attended 2009-2013 due to cost co.  2013 Walking desat test : resting 98/94% -> 3 laps x 185 feet; HR 113/pulse ox 96%    PFTs July 2013 show worsening restriction since 2010 along with reduced diffusion. The RLL baseline GGO might be worse in July 2013 compared to 2011. In addition, autommune profile shows VERY HIGH TITERS of CCP antibody > 300 which is pathognomonic of rheumtoid arthritis lung involvement. RF is only borderline elevated a 23  PFT 10/12/11>>FEV1 2.02 (70%), Fvc 2.8/72%, TLC 4.07 (59%), DLCO 62%, no BD: RESTRICTION - WORSE since 2010 -(2010:  Fev1 2.2L/73%, FVC 3.21L/70%, Ratio 68 (67),  TLC 4.7L/68%, RV 1.5L/55%, DLCO 79%)   Echo 05/04/11>>mild LVH, EF 55  to 696% grade 1 diastolic dysfx, mild AS, mild MR, PAS 34 mmHg  CT 11/02/11 - calcification suspicious for aortic valve and some GGO esp RLL > LLL (? RLL worse compared to 2011)   [place]9/27/13: dLM 80%, LAD 100%, circumflex 100%, RCA 100%, proximal SVG-OM 30%, SVG-D1 ok, SVG-RCA 99% distal and 80% ostial PDA distal to graft, LIMA-LAD ok with some left to right collaterals.  PCI 01/03/12: Promus DES to the SVG-RCA and Cutting Balloon angioplasty to the ostial PDA.  Echocardiogram 12/31/11: EF 522-29% grade 1 diastolic dysfunction, mild aortic stenosis, mean gradient 9, mild LAE.   Walking desaturation test on 05/05/2012 185 feet x 3 laps:  did NOT desaturate. Rest pulse ox was 98%, final pulse ox was 96%. HR response 69/min at rest to 82/min at peak exertion.   PFT FVC fev1 ratio BD fev1 TLC DLCO comment intervention  2010 3.2L/70% 2.2L/73%   4.7L/68% 79%    10/12/11 2.8L/72% 2.02L/70% 72  4.07L/59% 62% Worsening restriction since 2010 Rheum will start immuran/pred. Also had PCI in nov 2013  04/28/12 2.6L/59% 1.77L/62% 67  4.0L/59% 15.7/71%     '2015 PFT showed no significant change with FEV1 56% , ratio 66,  FVC 61%.   07/31/2015 Follow up : ILD ? RA related  Pt returns for follow up . Has been have more DOE.  Was set up for a  CT chest  PFT on 4/25 showed FEV1 57%, ratio 71, FVC 58%, no sign BD response , + BD in midflows.  DLCO 40%. (this is decreased from 2015- was 59%.  TLC is similar to 2015 . Has been started on Imuran for RA ILD .  CT chest did not show any sgin change , cont Bilateral LL GGO and bronchiectasis .  Remains on O2 2l/m  At bedtime  . ONO showed desats on RA , O2 was continued.  Has been seen by [facility] last year. Now on Imuran, feels Arthritis pain is better.  Says he is feels he is doing okay, gets winded with walking long distances.  Can walk 1/2 block before stopping.  Has dry cough some days.  PVX and Prevnar 13 are utd.   Followed by Dr. Estil Daft in Rheum. Remains on  Imuran. Most pain is in his hands.   Denies chest pain, orthopnea , edema or fever.    OV 12/09/2015   Chief Complaint  Patient presents with  . Follow-up    Feels about the same since the last time we saw him, cough, with some mucus    Follow-up refractory dyspnea in the setting of mild interstitial lung disease from rheumatoid arthritis and coronary artery disease and multifactorial reasons. Refractory to pulmonary rehabilitation.  He did see my nurse practitioner April 2017 and CT chest did not show any changes to his bilateral lower lobe groundglass opacities and bronchiectasis. He is being maintained on Imuran for his rheumatoid arthritis. He is on oxygen 2 L at bedtime.     Had echocardiogram May 2017 that shows elevated pulmonary artery systolic pressure, mild aortic stenosis and diastolic dysfunction. On 08/13/2015 he underwent left heart catheterization by Dr. Sherren Mocha that shows severe native three-vessel coronary artery disease status post bypass with continued patency of the LIMA to LAD and continued patency of the saphenous vein graft to OM with severe stenosis in the proximal body of the graft for which he had successful PCI of a drug-eluting stent treatment of in-stent restenosis. Recommendation is dual antiplatelet therapy lifelong.   He is with his wife currently. He says dyspnea is only some better. He still has significant dyspnea and faitgues more than his wife.   OV 06/07/2016  Chief Complaint  Patient presents with  . Follow-up    Pt feels like his breathing has not been doing good, pt c/o coughing more with greyish colored mucus, more increase sob with exertion, has some chest tightnes, does has some congestion Denies fever   Follow-up refractory dyspnea in the setting of rheumatoid arthritis andand coronary artery disease. Multifactorial dyspnea. On Imuran for rheumatoid arthritis oxygen 2 L at bedtime. He is refractory to pulmonary rehabilitation. He is  believed to have some interstitial lung disease but April 2017 CT chest suggested only mild bronchiectasis  Last seen in September 2017 and at that time as dyspnea with some better after having a cardiac stent. I discussed with his cardiologist about the possibility of having right heart catheterization but it was felt that the elevation in pulmonary artery pressure was not significant enough on the echocardiogram to warrant this. He tells me that he is deteriorated now and this shortness of breath is even worse. He has tried Symbicort in the past without much help. He is willing to try "anything" to help his dyspnea. Walking desaturation test on 06/07/2016 185 feet x 3 laps on RA:  did NOT desaturate. Rest pulse ox was 97%, final  pulse ox was 95%. Did only 2 laps and stopped in interim x 2. . HR response 64/min at rest to 70/min at peak exertion. He says he stopped due to dyspnea and chest pain - like always       OV 08/03/2016  Chief Complaint  Patient presents with  . Follow-up    Pt here after HRCT. Pt states he tried the trelegy and states it helped his breathing. Pt denies cough, CP/tightness, and f/c/s.     Refractory dyspnea in this gentleman with cardiac disease and also mixed obstructive restrictive spirometry due to air trapping and bronchiectasis and some groundglass.  Last visit I tried him on a sample of triple inhaler therapy. He says that this only worked temporarily and stop working. Nevertheless he wants another inhaler. He is frustrated by his dyspnea. He and his wife are making for relief from dyspnea. In 2016 he visited [facility] and they were considering switching his Imuran to CellCept in order to improve his pulmonary parenchymal problems. He is open to that. However he is not sure he needs follow-up with [facility] because Dr Deeann Dowse left the practice there. The suggestion of mild elevation in pulmonary artery systolic pressures and a previous echocardiogram but it  was not significant enough to warrant cardiology to do right heart catheterization. He is willing to have right heart catheterization if recommended. Overall frustrated. Pulm function test does not show any change in 2 years. And CT scan itself in 2018 appears to be stable   Results for TRAEVION, POEHLER (MRN [id number]) as of 08/03/2016 12:17  Ref. Range 09/18/2013 11:28 07/29/2015 11:12  FEV1-Pre Latest Units: L 1.69 1.67  FEV1-%Pred-Pre Latest Units: % 56 52  Pre FEV1/FVC ratio Latest Units: % 66 66  Results for MAGDIEL, BARTLES (MRN [id number]) as of 08/03/2016 12:17  Ref. Range 09/18/2013 11:28 07/29/2015 11:12  TLC Latest Units: L 4.70 4.81  TLC % pred Latest Units: % 64 62  Results for JERAMIAH, MCCAUGHEY (MRN [id number]) as of 08/03/2016 12:17  Ref. Range 09/18/2013 11:28 07/29/2015 11:12  DLCO unc Latest Units: ml/min/mmHg 19.91 14.87  DLCO unc % pred Latest Units: % 59 40   IMPRESSION: 1. Stable mild-to-moderate cylindrical and varicoid bronchiectasis in the bilateral dependent lower lobes. 2. Continued stability of parenchymal banding with associated subpleural reticulation and ground-glass attenuation at the areas of bronchiectasis in the dependent and basilar lower lobes, most consistent with postinfectious/postinflammatory scarring. No findings to suggest interstitial lung disease. 3. Stable smooth pleural thickening and calcification in the dependent basilar right pleural space, most compatible with chronic postinflammatory change. No pleural effusions. 4. Stable mild patchy air trapping in both lungs, suggesting small airways disease. 5. Aortic atherosclerosis. Left main and 3 vessel coronary atherosclerosis status post CABG.    OV 01/31/2017  Chief Complaint  Patient presents with  . Follow-up    Pt states that he has been sick x5 months with nausea and vomiting. C/o SOB with exertion, prod. cough with gray mucus, and occ. CP when SOB.    84 year old male with rheumatoid  arthritis and interstitial lung disease. He has refractory dyspnea class III on exertion relieved by rest. He also has associated exertional chest pain.at last visit I had his rheumatologist Dr. D evaluate him in switching his Imuran to CellCeptwhich she did in 11/18/2016. He is on low-dose methotrexate was reduced GFR. He is tolerating the medication well. He complains of nausea and vomiting but this preceded the CellCept and Bactrim  by 2 months.he is scheduled for an endoscopy in November 2018. He does not change because of the CellCept and Bactrim.Most recent lab 12/26/2016 fairly stable except mild reduction in hemoglobin. Dr. Keturah Barre has referred him back to primary care physician.he continues to be overwhelmed by his dyspnea and also associated chest pain with exertion. Was recently saw cardiology and had a stable echocardiogram and EKG according to his history. Cardiologist reassured him in his view of the chest pain is not associated with cardiac disease. He therefore is insisting to me that this is because of lung disease. Walking desaturation test 185 feet 3 laps on room air with a forehead probe: Resting pulse ox 99%. Final pulse ox 98%. Resting heart rate 80/m. Final heart rate 98 a minute. He did get dyspneic with chest pain as always. This no change in his oxygen desaturation test.\  The only changes on the last 3 weeks is complaining of increased cough congestion in his chestand a grayish sputum that is different from from baseline.   OV 04/29/2017  Chief Complaint  Patient presents with  . Follow-up    Pt states his SOB has become worse and coughing with gray mucus. Pt states abx he was placed on last visit did not clear up symptoms. Cellcept and bactrim were both stopped Tuesday, 04/26/17 pt believes.   Follow-up multifactorial refractory dyspnea in the setting of interstitial lung disease related to rheumatoid arthritis  He has been on CellCept for a while associated with Bactrim.  Earlier  this week he saw a rheumatologist Dr. Keturah Barre.  He complained of significant nausea.  She called me we spoke.  Because of lack of improvement with dyspnea with the CellCept regimen recommended by [facility], [facility], [facility].  We decided to discontinue this.  At the same time was having nausea and there was concern this was related to CellCept/Bactrim.  Today he tells me that since stopping CellCept/Bactrim nausea and vomiting have significantly improved.  He still feels fatigued.  His cardiologist now has it on a monitor because of his refractory dyspnea.  He is frustrated by his dyspnea which he says is exertional and associated with central chest pain.  There are no other new issues.  Recent review of the labs show that he has been anemic early in January 2019 and his chronic kidney disease is getting worse.  The status of these as of today is not known.  Next blood work is only in a month from now/   Walking desaturation test on 04/29/2017 185 feet x 3 laps on ROOM AIR:  did NOT desaturate. Rest pulse ox was 98%, final pulse ox was 95%. HR response 69/min at rest to 94/min at peak exertion. Patient [name]  Did not Desaturate < 88% . [name] yes did  Desaturated </= 3% points. [name] yes did get tachyardic   Results for BAYLER, GEHRIG (MRN [id number]) as of 04/29/2017 12:05  Ref. Range 11/10/2016 10:30 11/23/2016 10:28 01/25/2017 11:16 04/08/2017 09:52 04/18/2017 13:35  Creatinine Latest Ref Range: 0.76 - 1.27 mg/dL 2.18 (H) 1.93 (H) 2.04 (H) 2.28 (H)   Results for MUTASIM, TUCKEY (MRN [id number]) as of 04/29/2017 12:05  Ref. Range 11/10/2016 10:30 11/23/2016 10:28 01/25/2017 11:16 04/08/2017 09:52 04/18/2017 13:35  Hemoglobin Latest Ref Range: 13.0 - 17.7 g/dL 10.0 (L) 10.0 (L) 9.2 (L) 8.8 (L)      has a past medical history of Arthritis, CAD (coronary artery disease), Chronic diastolic CHF (congestive heart failure) ([place]),  Chronic kidney disease, stage III (moderate) ([place]), Chronic lower back pain, Colon  polyps, Diverticulosis, Dyspnea (2009 since July -Sept), Enlarged prostate, Esophageal stricture, GERD (gastroesophageal reflux disease), Heart murmur, Hiatal hernia, History of kidney stones, History of PFTs, Hyperlipidemia, Hypertension, Interstitial lung disease ([place]), Nausea & vomiting, Overweight (BMI 25.0-29.9), RA (rheumatoid arthritis) ([place]), Seropositive rheumatoid arthritis ([place]), and Type II diabetes mellitus ([place]).   reports that he quit smoking about 54 years ago. His smoking use included cigarettes. He has a 20.00 pack-year smoking history. He has quit using smokeless tobacco. His smokeless tobacco use included chew.  Past Surgical History:  Procedure Laterality Date  . CARDIAC CATHETERIZATION  08/2004   CP- no MI, Cath- small vessell disease   . CARDIAC CATHETERIZATION  12/31/2011   80% distal LM, 100% native LAD, LCx and RCA, 30% prox SVG-OM, SVG-D1 normal, 99% distal, 80% ostial SVG-RCA distal to graft, LIMA-LAD normal; LVEF mildly decreased with posterior basal AK   . CARDIAC CATHETERIZATION  2009   with patent grafts/notes 12/31/2011  . CARDIAC CATHETERIZATION N/A 08/13/2015   Procedure: Left Heart Cath and Cors/Grafts Angiography;  Surgeon: Sherren Mocha, MD;  Location: [facility];  Service: Cardiovascular;  Laterality: N/A;  . CATARACT EXTRACTION W/ INTRAOCULAR LENS  IMPLANT, BILATERAL Bilateral   . CHOLECYSTECTOMY OPEN  11/2003   [name]  . CORONARY ANGIOPLASTY WITH STENT PLACEMENT  01/03/2012   Successful DES to SVG-RCA and cutting balloon angioplasty ostial  PDA   . CORONARY ANGIOPLASTY WITH STENT PLACEMENT  01/14/2014   "1"  . CORONARY ARTERY BYPASS GRAFT  11/1999   CABG X5  . CORONARY STENT PLACEMENT  02/2012   1 stent and balloon  . ESOPHAGOGASTRODUODENOSCOPY N/A 03/01/2017   Procedure: ESOPHAGOGASTRODUODENOSCOPY (EGD);  Surgeon: Irene Shipper, MD;  Location: [facility];  Service: Endoscopy;  Laterality: N/A;  . ESOPHAGOGASTRODUODENOSCOPY (EGD) WITH  ESOPHAGEAL DILATION  2010  . HAND SURGERY    . JOINT REPLACEMENT    . KNEE ARTHROSCOPY Right 2008  . LEFT AND RIGHT HEART CATHETERIZATION WITH CORONARY ANGIOGRAM  12/31/2011   Procedure: LEFT AND RIGHT HEART CATHETERIZATION WITH CORONARY ANGIOGRAM;  Surgeon: Burnell Blanks, MD;  Location: [facility] CATH LAB;  Service: Cardiovascular;;  . LEFT AND RIGHT HEART CATHETERIZATION WITH CORONARY ANGIOGRAM N/A 01/14/2014   Procedure: LEFT AND RIGHT HEART CATHETERIZATION WITH CORONARY ANGIOGRAM;  Surgeon: Peter M Martinique, MD;  Location: [facility] CATH LAB;  Service: Cardiovascular;  Laterality: N/A;  . MALONEY DILATION  03/01/2017   Procedure: Venia Minks DILATION;  Surgeon: Irene Shipper, MD;  Location: [facility];  Service: Endoscopy;;  . PERCUTANEOUS CORONARY INTERVENTION-BALLOON ONLY  01/03/2012   Procedure: PERCUTANEOUS CORONARY INTERVENTION-BALLOON ONLY;  Surgeon: Peter M Martinique, MD;  Location: [facility] CATH LAB;  Service: Cardiovascular;;  . PERCUTANEOUS CORONARY STENT INTERVENTION (PCI-S)  12/31/2011   Procedure: PERCUTANEOUS CORONARY STENT INTERVENTION (PCI-S);  Surgeon: Burnell Blanks, MD;  Location: [facility] CATH LAB;  Service: Cardiovascular;;  . PERCUTANEOUS CORONARY STENT INTERVENTION (PCI-S) N/A 01/03/2012   Procedure: PERCUTANEOUS CORONARY STENT INTERVENTION (PCI-S);  Surgeon: Peter M Martinique, MD;  Location: [facility] C[facility] LAB;  Service: Cardiovascular;  Laterality: N/A;  . SHOULDER ARTHROSCOPY WITH OPEN ROTATOR CUFF REPAIR AND DISTAL CLAVICLE ACROMINECTOMY Left 02/27/2013   Procedure: LEFT SHOULDER ARTHROSCOPY WITH MINI OPEN ROTATOR CUFF REPAIR AND SUBACROMIAL DECOMPRESSION AND DISTAL CLAVICLE RESECTION;  Surgeon: [name], MD;  Location: [place];  Service: Orthopedics;  Laterality: Left;  . TOTAL KNEE ARTHROPLASTY Right 03/2010   [name]  .  TRIGGER FINGER RELEASE Left 02/27/2013   Procedure: RELEASE TRIGGER FINGER/A-1 PULLEY;  Surgeon: [name], MD;  Location: [place];  Service: Orthopedics;   Laterality: Left;    Allergies  Allergen Reactions  . Doxazosin Mesylate Other (See Comments)    dizziness  . Methocarbamol Rash    Immunization History  Administered Date(s) Administered  . H1N1 03/27/2008  . Influenza Split 12/29/2010, 01/18/2012  . Influenza Whole 02/03/2007, 01/09/2008, 01/01/2009, 12/31/2009  . Influenza,inj,Quad PF,6+ Mos 12/13/2012, 01/15/2014, 01/15/2015, 01/06/2016, 01/03/2017  . Pneumococcal Conjugate-13 09/10/2013  . Pneumococcal Polysaccharide-23 03/05/1997, 08/17/2011  . Td 03/05/1997, 08/17/2011    Family History  Problem Relation Age of Onset  . COPD Mother   . Heart disease Father   . Heart attack Father   . Diabetes Brother   . Colon cancer Brother 25  . Alcohol abuse Sister   . Stroke Sister      Current Outpatient Medications:  .  aspirin EC 81 MG tablet, Take 81 mg by mouth at bedtime., Disp: , Rfl:  .  Blood Glucose Monitoring Suppl (ONE TOUCH ULTRA 2) w/Device KIT, Use to obtain blood sugar daily. Dx Code E11.40, Disp: 1 each, Rfl: 0 .  carvedilol (COREG) 3.125 MG tablet, Take 1 tablet (3.125 mg total) by mouth 2 (two) times daily with a meal., Disp: 180 tablet, Rfl: 3 .  cholecalciferol (VITAMIN D) 1000 units tablet, Take 1,000 Units by mouth daily., Disp: , Rfl:  .  clopidogrel (PLAVIX) 75 MG tablet, TAKE 1 TABLET BY MOUTH DAILY WITH BREAKFAST., Disp: 30 tablet, Rfl: 10 .  DHA-Vitamin C-Lutein (EYE HEALTH FORMULA PO), Take 1 tablet by mouth daily. , Disp: , Rfl:  .  furosemide (LASIX) 40 MG tablet, TAKE 1 TO 2 TABLETS BY MOUTH EVERY DAY., Disp: 60 tablet, Rfl: 0 .  glucose blood (ONE TOUCH ULTRA TEST) test strip, USE TO CHECK BLOOD SUGAR ONCE A DAY Dx Code E11.40, Disp: 100 each, Rfl: 3 .  HYDROcodone-acetaminophen (NORCO/VICODIN) 5-325 MG tablet, Take 1 tablet by mouth 2 (two) times daily as needed for moderate pain., Disp: 60 tablet, Rfl: 0 .  metFORMIN (GLUCOPHAGE) 1000 MG tablet, Take 1 tablet (1,000 mg total) by mouth daily with  breakfast., Disp: 30 tablet, Rfl: 3 .  Multiple Vitamin (MULTIVITAMIN WITH MINERALS) TABS tablet, Take 1 tablet by mouth daily., Disp: , Rfl:  .  nitroGLYCERIN (NITROSTAT) 0.4 MG SL tablet, DISSOLVE 1 TABLET UNDER THE TONGUE FOR CHEST PAIN. MAY REPEAT EVERY 5MINUTES UP TO 3 DOSES. IF NO RELIEF, CALL 911**, Disp: 25 tablet, Rfl: 5 .  ondansetron (ZOFRAN) 4 MG tablet, TAKE ONE TABLET BY MOUTH EVERY 8 HOURS AS NEEDED FOR NAUSEA OR VOMITING, Disp: 60 tablet, Rfl: 1 .  ONETOUCH DELICA LANCETS 96E MISC, , Disp: , Rfl:  .  OXYGEN, Inhale into the lungs. Per pt- Uses Oxygen 2 liters at night., Disp: , Rfl:  .  pantoprazole (PROTONIX) 40 MG tablet, TAKE 1 TABLET BY MOUTH TWICE A DAY, Disp: 60 tablet, Rfl: 0 .  mycophenolate (CELLCEPT) 500 MG tablet, TAKE 1 TABLET BY MOUTH TWICE A DAY (Patient not taking: Reported on 04/26/2017), Disp: 180 tablet, Rfl: 0 .  sulfamethoxazole-trimethoprim (BACTRIM DS,SEPTRA DS) 800-160 MG tablet, TAKE ONE-HALF TABLET BY MOUTH THREE TIMES A WEEK. (Patient not taking: Reported on 04/26/2017), Disp: 6 tablet, Rfl: 2  Review of Systems     Objective:   Physical Exam  Constitutional: He is oriented to person, place, and time. He appears well-developed and  well-nourished. No distress.  HENT:  Head: Normocephalic and atraumatic.  Right Ear: External ear normal.  Left Ear: External ear normal.  Mouth/Throat: Oropharynx is clear and moist. No oropharyngeal exudate.  Eyes: Conjunctivae and EOM are normal. Pupils are equal, round, and reactive to light. Right eye exhibits no discharge. Left eye exhibits no discharge. No scleral icterus.  Neck: Normal range of motion. Neck supple. No JVD present. No tracheal deviation present. No thyromegaly present.  Cardiovascular: Normal rate, regular rhythm and intact distal pulses. Exam reveals no gallop and no friction rub.  No murmur heard. Pulmonary/Chest: Effort normal. No respiratory distress. He has no wheezes. He has rales. He exhibits no  tenderness.  Has monitor  Abdominal: Soft. Bowel sounds are normal. He exhibits no distension and no mass. There is no tenderness. There is no rebound and no guarding.  vsicera obeisty   Musculoskeletal: Normal range of motion. He exhibits no edema or tenderness.  Lymphadenopathy:    He has no cervical adenopathy.  Neurological: He is alert and oriented to person, place, and time. He has normal reflexes. No cranial nerve deficit. Coordination normal.  Skin: Skin is warm and dry. No rash noted. He is not diaphoretic. No erythema. No pallor.  Psychiatric: He has a normal mood and affect. His behavior is normal. Judgment and thought content normal.  Nursing note and vitals reviewed.  Vitals:   04/29/17 1151  BP: 134/76  Pulse: 71  SpO2: 97%  Weight: 204 lb 12.8 oz (92.9 kg)  Height: '6\' 1"'$  (1.854 m)    Estimated body mass index is 27.02 kg/m as calculated from the following:   Height as of this encounter: '6\' 1"'$  (1.854 m).   Weight as of this encounter: 204 lb 12.8 oz (92.9 kg).     Assessment:       ICD-10-CM   1. ILD (interstitial lung disease) ([place]) J84.9   2. Dyspnea, unspecified type R06.00   3. Anemia, unspecified type D64.9   4. Chronic kidney disease, unspecified CKD stage N18.9   5. Nausea and vomiting, intractability of vomiting not specified, unspecified vomiting type R11.2   6. Encounter for therapeutic drug monitoring Z51.81        Plan:      Glad bettter nause and vomit after cellcept stopped 04/25/17 Noticed early in jan 2019 you are more anemic and kidneys worse Unable to resolve your shortness of breath which is multifactorial  Plan - no more cellcept and bactrim - check cbc, bmet and lft  Folllowup 3 months or sooner if needed  - simple (not 6 min) walk test in office at followup   Dr. Brand Males, M.D., [facility].C.P Pulmonary and Critical Care Medicine Staff Physician, [place] Director - Interstitial Lung Disease  Program   Pulmonary [address] at [place], [place], [address]  Pager: [phone], If no answer or between  15:00h - 7:00h: call [phone] Telephone: [phone]

## 2017-05-02 DIAGNOSIS — H43813 Vitreous degeneration, bilateral: Secondary | ICD-10-CM | POA: Diagnosis not present

## 2017-05-02 DIAGNOSIS — H353213 Exudative age-related macular degeneration, right eye, with inactive scar: Secondary | ICD-10-CM | POA: Diagnosis not present

## 2017-05-02 DIAGNOSIS — H35372 Puckering of macula, left eye: Secondary | ICD-10-CM | POA: Diagnosis not present

## 2017-05-02 DIAGNOSIS — H353221 Exudative age-related macular degeneration, left eye, with active choroidal neovascularization: Secondary | ICD-10-CM | POA: Diagnosis not present

## 2017-05-09 ENCOUNTER — Other Ambulatory Visit: Payer: Self-pay | Admitting: Family Medicine

## 2017-05-09 ENCOUNTER — Other Ambulatory Visit: Payer: Self-pay | Admitting: Internal Medicine

## 2017-05-10 ENCOUNTER — Telehealth: Payer: Self-pay | Admitting: Internal Medicine

## 2017-05-10 NOTE — Telephone Encounter (Signed)
Left detailed message per dpr that we did not have labs from Rheumatology

## 2017-05-10 NOTE — Telephone Encounter (Signed)
Copied from [place] [id number]. Topic: Quick Communication - See Telephone Encounter >> May 10, 2017 11:44 AM Robina Ade, Helene Kelp D wrote: CRM for notification. See Telephone encounter for: 05/10/17. Patient called and said that his rheumatologist had lab done last week and wants to know if Dr. Silvio Pate got those results so he can review them. Please call patient back, thanks.

## 2017-05-10 NOTE — Telephone Encounter (Signed)
PT states that Dr Orvil Feil office asked the pt to have Dr Shelda Altes look at the results and decide if he wants to do further testing related to anemia and kidney function.

## 2017-05-10 NOTE — Telephone Encounter (Signed)
I only see labs from 1/25 done by Dr Chase Caller

## 2017-05-10 NOTE — Telephone Encounter (Signed)
Patient called back through the [facility] and wants to know that he meant for Dr. Golden Pop office.  I explained that he would need to call their office directly to discuss any specific questions or concerns. But, I will let Dr. Silvio Pate know.  Thanks.

## 2017-05-11 NOTE — Telephone Encounter (Signed)
Please let him know that the labs are not particularly different and no action is needed about this

## 2017-05-11 NOTE — Telephone Encounter (Signed)
Left a detailed message per DPR for pt with Dr Alla German statement below.

## 2017-05-12 ENCOUNTER — Encounter: Payer: Self-pay | Admitting: Internal Medicine

## 2017-05-12 ENCOUNTER — Ambulatory Visit (INDEPENDENT_AMBULATORY_CARE_PROVIDER_SITE_OTHER): Payer: Medicare Other | Admitting: Internal Medicine

## 2017-05-12 VITALS — Ht 73.0 in | Wt 207.0 lb

## 2017-05-12 DIAGNOSIS — K5900 Constipation, unspecified: Secondary | ICD-10-CM | POA: Diagnosis not present

## 2017-05-12 DIAGNOSIS — K222 Esophageal obstruction: Secondary | ICD-10-CM | POA: Diagnosis not present

## 2017-05-12 DIAGNOSIS — R1310 Dysphagia, unspecified: Secondary | ICD-10-CM | POA: Diagnosis not present

## 2017-05-12 DIAGNOSIS — K224 Dyskinesia of esophagus: Secondary | ICD-10-CM

## 2017-05-12 DIAGNOSIS — R110 Nausea: Secondary | ICD-10-CM

## 2017-05-12 DIAGNOSIS — K219 Gastro-esophageal reflux disease without esophagitis: Secondary | ICD-10-CM

## 2017-05-12 NOTE — Patient Instructions (Signed)
Please purchase the following medications over the counter and take as directed: Laxative of your choice-take as directed for constipation  Follow up with Dr Henrene Pastor as needed  If you are age 82 or older, your body mass index should be between 23-30. Your Body mass index is 27.31 kg/m. If this is out of the aforementioned range listed, please consider follow up with your Primary Care Provider.  If you are age 76 or younger, your body mass index should be between 19-25. Your Body mass index is 27.31 kg/m. If this is out of the aformentioned range listed, please consider follow up with your Primary Care Provider.

## 2017-05-13 ENCOUNTER — Encounter: Payer: Self-pay | Admitting: Internal Medicine

## 2017-05-13 NOTE — Progress Notes (Signed)
HISTORY OF PRESENT ILLNESS:  [name] is a 84 y.o. male with multiple significant medical problems as listed below who presents today for ongoing evaluation and treatment of dysphagia. He is coming by his wife. He is known to have esophageal stricture which has been dilated in the past as well as likely esophageal dysmotility. Because of complaints of both solid and liquid dysphagia he underwent upper endoscopy with esophageal dilation 03/01/2017. He was found to have a distal esophageal stricture which was dilated to a maximal diameter 54 [place]. He was asked to follow-up at this time. Patient reports that his solid food dysphagia has resolved. Unfortunately he continues with intermittent liquid dysphagia without change. No coughing or choking spells. He continues with chronic nausea without change. His new complaints today is that of constipation which he has had intermittently for some time. He does tell me that over-the-counter laxatives are helpful. His last complete colonoscopy in 2010 revealed mild diverticulosis and diminutive not advanced colon polyps which were removed.  REVIEW OF SYSTEMS:  All non-GI ROS negative was otherwise stated in the history of present illness except for arthritis, cough, hearing problems, heart murmur, urinary leakage, shortness of breath  Past Medical History:  Diagnosis Date  . Arthritis    osteoarthritis, s/p R TKR, and digits  . CAD (coronary artery disease)    a. s/p CABG (2001)  b. s/p DES to RCA and cutting POBA to ostial PDA (2013)   c. s/p DES to SVG to OM2 (01/14/14) d. cath: 08/2015 NSTEMI w/ patent LIMA-LAD and 99% stenosis of SVG-OM w/ DES placed. CTO of SVG-RCA and SVG-D1.   [name] Chronic diastolic CHF (congestive heart failure) ([place])    a) 09/13 ECHO- LVEF 75-64%, grade 1 diastolic dysfunction, mild LA dilatation, atrial septal aneurysm, AV mobility restricted, but no sig AS by doppler; b) 09/04/08 ECHO- LVH, ef 60%, mild AS, c. echo 08/2015: EF  perserved of 55-60% with inferolateral HK. Mild AS noted.  . Chronic kidney disease, stage III (moderate) ([place])   . Chronic lower back pain   . Colon polyps   . Diverticulosis   . Dyspnea 2009 since July -Sept   05/06/08-CPST-  normal effort, reduced VO2 max 20.5 /65%, reduced at 8.2/ 40%, normal breathing resetvca of 55%, submaximal heart rate response 112/77%, flattened o2 pluse response at peak exercise-12 ml/beat @ 85%, No VQ mismatch abnormalities, All c/w CIRC Limitation  . Enlarged prostate   . Esophageal stricture    a. s/p dilation spring 2010  . GERD (gastroesophageal reflux disease)   . Heart murmur   . Hiatal hernia   . History of kidney stones   . History of PFTs    mixed pattern on spiro. mild restn on lung volumes with near normal DLCO. Pattern can be explained by CABG scar. Fev1 2.2L/73%, ratio 68 (67), TLC 4.7/68%,RV 1.5L/55%,DLCO 79%  . Hyperlipidemia   . Hypertension   . Interstitial lung disease ([name])    NOS  . Nausea & vomiting    2018/2019  . Overweight (BMI 25.0-29.9)    BMI 29  . RA (rheumatoid arthritis) ([name])    [name]  . Seropositive rheumatoid arthritis ([place])   . Type II diabetes mellitus ([place])     Past Surgical History:  Procedure Laterality Date  . CARDIAC CATHETERIZATION  08/2004   CP- no MI, Cath- small vessell disease   . CARDIAC CATHETERIZATION  12/31/2011   80% distal LM, 100% native LAD, LCx and RCA, 30% prox  SVG-OM, SVG-D1 normal, 99% distal, 80% ostial SVG-RCA distal to graft, LIMA-LAD normal; LVEF mildly decreased with posterior basal AK   . CARDIAC CATHETERIZATION  2009   with patent grafts/notes 12/31/2011  . CARDIAC CATHETERIZATION N/A 08/13/2015   Procedure: Left Heart Cath and Cors/Grafts Angiography;  Surgeon: Sherren Mocha, MD;  Location: [place] CV [facility];  Service: Cardiovascular;  Laterality: N/A;  . CATARACT EXTRACTION W/ INTRAOCULAR LENS  IMPLANT, BILATERAL Bilateral   . CHOLECYSTECTOMY OPEN  11/2003   [name]  . CORONARY  ANGIOPLASTY WITH STENT PLACEMENT  01/03/2012   Successful DES to SVG-RCA and cutting balloon angioplasty ostial  PDA   . CORONARY ANGIOPLASTY WITH STENT PLACEMENT  01/14/2014   "1"  . CORONARY ARTERY BYPASS GRAFT  11/1999   CABG X5  . CORONARY STENT PLACEMENT  02/2012   1 stent and balloon  . ESOPHAGOGASTRODUODENOSCOPY N/A 03/01/2017   Procedure: ESOPHAGOGASTRODUODENOSCOPY (EGD);  Surgeon: Irene Shipper, MD;  Location: [facility];  Service: Endoscopy;  Laterality: N/A;  . ESOPHAGOGASTRODUODENOSCOPY (EGD) WITH ESOPHAGEAL DILATION  2010  . HAND SURGERY    . JOINT REPLACEMENT    . KNEE ARTHROSCOPY Right 2008  . LEFT AND RIGHT HEART CATHETERIZATION WITH CORONARY ANGIOGRAM  12/31/2011   Procedure: LEFT AND RIGHT HEART CATHETERIZATION WITH CORONARY ANGIOGRAM;  Surgeon: Burnell Blanks, MD;  Location[facility] CATH LAB;  Service: Cardiovascular;;  . LEFT AND RIGHT HEART CATHETERIZATION WITH CORONARY ANGIOGRAM N/A 01/14/2014   Procedure: LEFT AND RIGHT HEART CATHETERIZATION WITH CORONARY ANGIOGRAM;  Surgeon: Peter M Martinique, MD;  Location: [facility] CATH LAB;  Service: Cardiovascular;  Laterality: N/A;  . MALONEY DILATION  03/01/2017   Procedure: Venia Minks DILATION;  Surgeon: Irene Shipper, MD;  Location: [facility];  Service: Endoscopy;;  . PERCUTANEOUS CORONARY INTERVENTION-BALLOON ONLY  01/03/2012   Procedure: PERCUTANEOUS CORONARY INTERVENTION-BALLOON ONLY;  Surgeon: Peter M Martinique, MD;  [facility] C[facility] LAB;  Service: Cardiovascular;;  . PERCUTANEOUS CORONARY STENT INTERVENTION (PCI-S)  12/31/2011   Procedure: PERCUTANEOUS CORONARY STENT INTERVENTION (PCI-S);  Surgeon: Burnell Blanks, MD;  Location: [facility] CATH LAB;  Service: Cardiovascular;;  . PERCUTANEOUS CORONARY STENT INTERVENTION (PCI-S) N/A 01/03/2012   Procedure: PERCUTANEOUS CORONARY STENT INTERVENTION (PCI-S);  Surgeon: Peter M Martinique, MD;  Location: [facility] CATH LAB;  Service: Cardiovascular;  Laterality: N/A;  . SHOULDER ARTHROSCOPY WITH  OPEN ROTATOR CUFF REPAIR AND DISTAL CLAVICLE ACROMINECTOMY Left 02/27/2013   Procedure: LEFT SHOULDER ARTHROSCOPY WITH MINI OPEN ROTATOR CUFF REPAIR AND SUBACROMIAL DECOMPRESSION AND DISTAL CLAVICLE RESECTION;  Surgeon: [name], MD;  Location: [place];  Service: Orthopedics;  Laterality: Left;  . TOTAL KNEE ARTHROPLASTY Right 03/2010   [name]  . TRIGGER FINGER RELEASE Left 02/27/2013   Procedure: RELEASE TRIGGER FINGER/A-1 PULLEY;  Surgeon: [name], MD;  Location: [place];  Service: Orthopedics;  Laterality: Left;    Social History [name]  reports that he quit smoking about 54 years ago. His smoking use included cigarettes. He has a 20.00 pack-year smoking history. He has quit using smokeless tobacco. His smokeless tobacco use included chew. He reports that he does not drink alcohol or use drugs.  family history includes Alcohol abuse in his sister; COPD in his mother; Colon cancer (age of onset: 72) in his brother; Diabetes in his brother; Heart attack in his father; Heart disease in his father; Stroke in his sister.  Allergies  Allergen Reactions  . Doxazosin Mesylate Other (See Comments)    dizziness  . Methocarbamol Rash  PHYSICAL EXAMINATION: Vital signs: Ht 6\' 1"  (3.810 m)   Wt 207 lb (93.9 kg)   BMI 27.31 kg/m   Constitutional: generally well-appearing, no acute distress Psychiatric: alert and oriented x3, cooperative Eyes: extraocular movements intact, anicteric, conjunctiva pink Mouth: oral pharynx moist, no lesions Neck: supple no lymphadenopathy Cardiovascular: heart regular rate and rhythm, no murmur Lungs: clear to auscultation bilaterally Abdomen: soft, nontender, nondistended, no obvious ascites, no peritoneal signs, normal bowel sounds, no organomegaly Rectal: Omitted Extremities: no lower extremity edema bilaterally Skin: no lesions on visible extremities Neuro: No focal deficits.  ASSESSMENT:  #1. Complex dysphagia  secondary to esophageal stricture and highly probable esophageal dysmotility #2. Status post esophageal stricture dilation. Resolution of solid food dysphagia #3. Ongoing liquid dysphagia. Rule out formal motility disorder #4. Functional constipation #5. Multiple medical problems #6. Chronic stable nausea #7. GERD  PLAN:  #1. Recommend daily PPI to reduce the risk of stricture formation, help with his nausea, and treat possible secondary to acid dysmotility #2. Antinausea medicine as needed #3. Recommended esophageal manometry. He wishes to hold off #4. MiraLAX for bowels. Otherwise laxative choice #5. Return to the care of Dr. Carlena Sax back  25 minutes spent face-to-face with the patient. Greater than 50% a time use for counseling regarding his issues with dysphagia, nausea, and constipation

## 2017-05-16 NOTE — Progress Notes (Signed)
Cardiology Office Note:    Date:  05/20/2017   ID:  [name] June 20, 1931, MRN [id number]  PCP:  [name], MD  Cardiologist:  [name], MD   Referring MD: [name], MD   No chief complaint on file.   History of Present Illness:    [name] is a 84 y.o. male with a hx of CAD status post CABG and subsequent PCI with DES to the SVG-RCA and cutting balloon angioplasty to the ostial PDA in September 2013, interstitial lung disease, aortic stenosis, chronic kidney disease, rheumatoid arthritis, hypertension, hyperlipidemia, diabetes.  He underwent PCI in October 2015 with DES to the SVG-OM2 secondary to in-stent restenosis.  His last cardiac catheterization was in 5/17 and demonstrated 99% in-stent restenosis in the proximal SVG-OM1/OM2.  This was treated with DES.  Last seen by PA for palpitations  04/08/17 .    Chronic complaints of dyspnea , GERD and fatigue  Lots of nausea and GI issues with esophageal dilatation Dr Henrene Pastor 03/01/17  Labs remarkable for BNP 626 A1c 8  Cr 2.3 Hct 28   Event monitor reveiwed SR benign PACls and PVC;s no runs   Of note, he tells me he has been nauseated for 6 months with occasional vomiting.  He has been seen by gastroenterology and had recent esophageal dilatation.  He denies fevers or bleeding issues.  He also notes occasional lightheadedness.  This does not seem to be associated with his palpitations or nausea.  Prior CV studies:   The following studies were reviewed today:  Echo 01/17/17 Mild LVH, EF 60-65, inf AK, Gr 1 DD, mild to mod AS (mean 18, peak 32), mild AI  Echo 01/13/16 EF 55-60, no RWMA, Gr 2 DD, mild AS (mean 12, peak 26), mild AI, mild reduced RVSF, PASP 46 (mod inc)  Echo 08/14/15 Mild LVH, EF 55-60, inf-lat HK, Gr 1 DD, mild AS (mean 12, peak 22), mild AI, MAC, mild MR, mild lAE, PASP 36  Cardiac Catheterization 08/13/15 LM ost 80 LAD prox 100 OM2 90 RCA prox 100 L-LAD ok S-OM1/OM2 prox 99  ISR S-RCA 100 (CTO) S-D1 prox 100 (CTO) PCI:  3.5 x 12 mm Resolute DES to S-OM1/OM2      Nuclear stress test 01/08/14 Overall Impression:  High risk stress nuclear study with two perfusion defects: 1. Medium size severe severity ischemia in the mid LAD territory (SDS 8) and 2. Large scar in the entire inferior, and basal and mid inferolateral walls.  LV Ejection Fraction: 45%.  LV Wall Motion:  Paradoxical septal motion and akinesis of the basal and mid inferior and inferolateral walls.    Past Medical History:  Diagnosis Date  . Arthritis    osteoarthritis, s/p R TKR, and digits  . CAD (coronary artery disease)    a. s/p CABG (2001)  b. s/p DES to RCA and cutting POBA to ostial PDA (2013)   c. s/p DES to SVG to OM2 (01/14/14) d. cath: 08/2015 NSTEMI w/ patent LIMA-LAD and 99% stenosis of SVG-OM w/ DES placed. CTO of SVG-RCA and SVG-D1.   [name] Chronic diastolic CHF (congestive heart failure) ([place])    a) 09/13 ECHO- LVEF 32-20%, grade 1 diastolic dysfunction, mild LA dilatation, atrial septal aneurysm, AV mobility restricted, but no sig AS by doppler; b) 09/04/08 ECHO- LVH, ef 60%, mild AS, c. echo 08/2015: EF perserved of 55-60% with inferolateral HK. Mild AS noted.  . Chronic kidney disease, stage III (moderate) ([place])   .  Chronic lower back pain   . Colon polyps   . Diverticulosis   . Dyspnea 2009 since July -Sept   05/06/08-CPST-  normal effort, reduced VO2 max 20.5 /65%, reduced at 8.2/ 40%, normal breathing resetvca of 55%, submaximal heart rate response 112/77%, flattened o2 pluse response at peak exercise-12 ml/beat @ 85%, No VQ mismatch abnormalities, All c/w CIRC Limitation  . Enlarged prostate   . Esophageal stricture    a. s/p dilation spring 2010  . GERD (gastroesophageal reflux disease)   . Heart murmur   . Hiatal hernia   . History of kidney stones   . History of PFTs    mixed pattern on spiro. mild restn on lung volumes with near normal DLCO. Pattern can be explained by CABG  scar. Fev1 2.2L/73%, ratio 68 (67), TLC 4.7/68%,RV 1.5L/55%,DLCO 79%  . Hyperlipidemia   . Hypertension   . Interstitial lung disease ([name])    NOS  . Nausea & vomiting    2018/2019  . Overweight (BMI 25.0-29.9)    BMI 29  . RA (rheumatoid arthritis) ([name])    [name]  . Seropositive rheumatoid arthritis ([place])   . Type II diabetes mellitus ([place])     Past Surgical History:  Procedure Laterality Date  . CARDIAC CATHETERIZATION  08/2004   CP- no MI, Cath- small vessell disease   . CARDIAC CATHETERIZATION  12/31/2011   80% distal LM, 100% native LAD, LCx and RCA, 30% prox SVG-OM, SVG-D1 normal, 99% distal, 80% ostial SVG-RCA distal to graft, LIMA-LAD normal; LVEF mildly decreased with posterior basal AK   . CARDIAC CATHETERIZATION  2009   with patent grafts/notes 12/31/2011  . CARDIAC CATHETERIZATION N/A 08/13/2015   Procedure: Left Heart Cath and Cors/Grafts Angiography;  Surgeon: Sherren Mocha, MD;  Location: [place] [facility] LAB;  Service: Cardiovascular;  Laterality: N/A;  . CATARACT EXTRACTION W/ INTRAOCULAR LENS  IMPLANT, BILATERAL Bilateral   . CHOLECYSTECTOMY OPEN  11/2003   [name]  . CORONARY ANGIOPLASTY WITH STENT PLACEMENT  01/03/2012   Successful DES to SVG-RCA and cutting balloon angioplasty ostial  PDA   . CORONARY ANGIOPLASTY WITH STENT PLACEMENT  01/14/2014   "1"  . CORONARY ARTERY BYPASS GRAFT  11/1999   CABG X5  . CORONARY STENT PLACEMENT  02/2012   1 stent and balloon  . ESOPHAGOGASTRODUODENOSCOPY N/A 03/01/2017   Procedure: ESOPHAGOGASTRODUODENOSCOPY (EGD);  Surgeon: Irene Shipper, MD;  Location: [facility];  Service: Endoscopy;  Laterality: N/A;  . ESOPHAGOGASTRODUODENOSCOPY (EGD) WITH ESOPHAGEAL DILATION  2010  . HAND SURGERY    . JOINT REPLACEMENT    . KNEE ARTHROSCOPY Right 2008  . LEFT AND RIGHT HEART CATHETERIZATION WITH CORONARY ANGIOGRAM  12/31/2011   Procedure: LEFT AND RIGHT HEART CATHETERIZATION WITH CORONARY ANGIOGRAM;  Surgeon: Burnell Blanks, MD;  Location: [facility] CATH LAB;  Service: Cardiovascular;;  . LEFT AND RIGHT HEART CATHETERIZATION WITH CORONARY ANGIOGRAM N/A 01/14/2014   Procedure: LEFT AND RIGHT HEART CATHETERIZATION WITH CORONARY ANGIOGRAM;  Surgeon: Iqra Rotundo M Martinique, MD;  Location: [facility] CATH LAB;  Service: Cardiovascular;  Laterality: N/A;  . MALONEY DILATION  03/01/2017   Procedure: Venia Minks DILATION;  Surgeon: Irene Shipper, MD;  Location: [facility];  Service: Endoscopy;;  . PERCUTANEOUS CORONARY INTERVENTION-BALLOON ONLY  01/03/2012   Procedure: PERCUTANEOUS CORONARY INTERVENTION-BALLOON ONLY;  Surgeon: Samrat Hayward M Martinique, MD;  Location: [facility] CATH LAB;  Service: Cardiovascular;;  . PERCUTANEOUS CORONARY STENT INTERVENTION (PCI-S)  12/31/2011   Procedure: PERCUTANEOUS CORONARY STENT INTERVENTION (PCI-S);  Surgeon:  Burnell Blanks, MD;  Location: [facility] CATH LAB;  Service: Cardiovascular;;  . PERCUTANEOUS CORONARY STENT INTERVENTION (PCI-S) N/A 01/03/2012   Procedure: PERCUTANEOUS CORONARY STENT INTERVENTION (PCI-S);  Surgeon: Chrisha Vogel M Martinique, MD;  Location: [facility] CATH LAB;  Service: Cardiovascular;  Laterality: N/A;  . SHOULDER ARTHROSCOPY WITH OPEN ROTATOR CUFF REPAIR AND DISTAL CLAVICLE ACROMINECTOMY Left 02/27/2013   Procedure: LEFT SHOULDER ARTHROSCOPY WITH MINI OPEN ROTATOR CUFF REPAIR AND SUBACROMIAL DECOMPRESSION AND DISTAL CLAVICLE RESECTION;  Surgeon: [name], MD;  Location: [place];  Service: Orthopedics;  Laterality: Left;  . TOTAL KNEE ARTHROPLASTY Right 03/2010   [name]  . TRIGGER FINGER RELEASE Left 02/27/2013   Procedure: RELEASE TRIGGER FINGER/A-1 PULLEY;  Surgeon: [name], MD;  Location: [place];  Service: Orthopedics;  Laterality: Left;    Current Medications: Current Meds  Medication Sig  . aspirin EC 81 MG tablet Take 81 mg by mouth at bedtime.  . Blood Glucose Monitoring Suppl (ONE TOUCH ULTRA 2) w/Device KIT Use to obtain blood sugar daily. Dx Code E11.40  . carvedilol (COREG)  3.125 MG tablet Take 1 tablet (3.125 mg total) by mouth 2 (two) times daily with a meal.  . cholecalciferol (VITAMIN D) 1000 units tablet Take 1,000 Units by mouth daily.  . clopidogrel (PLAVIX) 75 MG tablet TAKE 1 TABLET BY MOUTH DAILY WITH BREAKFAST.  [name] DHA-Vitamin C-Lutein (EYE HEALTH FORMULA PO) Take 1 tablet by mouth daily.   . ferrous sulfate 324 (65 Fe) MG TBEC Take 1 tablet by mouth daily.  . furosemide (LASIX) 40 MG tablet TAKE 1 TO 2 TABLETS BY MOUTH EVERY DAY  . glucose blood (ONE TOUCH ULTRA TEST) test strip USE TO CHECK BLOOD SUGAR ONCE A DAY Dx Code E11.40  . HYDROcodone-acetaminophen (NORCO/VICODIN) 5-325 MG tablet Take 1 tablet by mouth 2 (two) times daily as needed for moderate pain.  . metFORMIN (GLUCOPHAGE) 1000 MG tablet TAKE 1 TABLET BY MOUTH ONCE DAILY WITH BREAKFAST  . Multiple Vitamin (MULTIVITAMIN WITH MINERALS) TABS tablet Take 1 tablet by mouth daily.  . nitroGLYCERIN (NITROSTAT) 0.4 MG SL tablet DISSOLVE 1 TABLET UNDER THE TONGUE FOR CHEST PAIN. MAY REPEAT EVERY 5MINUTES UP TO 3 DOSES. IF NO RELIEF, CALL 911**  . ondansetron (ZOFRAN) 4 MG tablet TAKE ONE TABLET BY MOUTH EVERY 8 HOURS AS NEEDED FOR NAUSEA OR VOMITING  . ONETOUCH DELICA LANCETS 01E MISC   . OXYGEN Inhale into the lungs. Per pt- Uses Oxygen 2 liters at night.  . pantoprazole (PROTONIX) 40 MG tablet TAKE 1 TABLET BY MOUTH TWICE (2) DAILY     Allergies:   Doxazosin mesylate and Methocarbamol   Social History   Tobacco Use  . Smoking status: Former Smoker    Packs/day: 1.00    Years: 20.00    Pack years: 20.00    Types: Cigarettes    Last attempt to quit: 04/06/1963    Years since quitting: 54.1  . Smokeless tobacco: Former Systems developer    Types: Chew  Substance Use Topics  . Alcohol use: No    Alcohol/week: 0.0 oz    Comment: 01/01/2012 "last alcohol ~ 50 yr ago"  . Drug use: No     Family Hx: The patient's family history includes Alcohol abuse in his sister; COPD in his mother; Colon cancer (age of  onset: 76) in his brother; Diabetes in his brother; Heart attack in his father; Heart disease in his father; Stroke in his sister.  ROS:   Please see the  history of present illness.    ROS All other systems reviewed and are negative.   EKGs/Labs/Other Test Reviewed:    EKG:  Normal sinus rhythm, HR 73, normal axis, QTC 451 ms,  PVC no change from prior tracing  Recent Labs: 04/08/2017: Magnesium 1.9; NT-Pro BNP 626; TSH 2.100 04/29/2017: ALT 9; BUN 31; Creatinine, Ser 2.29; Hemoglobin 8.8 Repeated and verified X2.; Platelets 304.0; Potassium 4.5; Sodium 137   Recent Lipid Panel Lab Results  Component Value Date/Time   CHOL 109 10/14/2016 02:29 PM   TRIG 177.0 (H) 10/14/2016 02:29 PM   HDL 30.20 (L) 10/14/2016 02:29 PM   CHOLHDL 4 10/14/2016 02:29 PM   LDLCALC 43 10/14/2016 02:29 PM    Physical Exam:    VS:  BP 128/74   Pulse 75   Ht _0  (1.854 m)   Wt 207 lb 8 oz (94.1 kg)   BMI 27.38 kg/m     Wt Readings from Last 3 Encounters:  05/20/17 207 lb 8 oz (94.1 kg)  05/12/17 207 lb (93.9 kg)  04/29/17 204 lb 12.8 oz (92.9 kg)     Physical Exam  Constitutional: He is oriented to person, place, and time. He appears well-developed and well-nourished. No distress.  HENT:  Head: Normocephalic and atraumatic.  Eyes: No scleral icterus.  Neck: No JVD present.  Cardiovascular: Normal rate and regular rhythm.  Murmur heard.  Harsh systolic murmur is present with a grade of 2/6 at the upper right sternal border. Mild AS murmur   Pulmonary/Chest: He has no wheezes.  Right basilar crackles from ILD  Abdominal: Soft.  Musculoskeletal: He exhibits no edema.  Neurological: He is alert and oriented to person, place, and time.  Skin: Skin is warm and dry.    ASSESSMENT:    No diagnosis found.  PLAN:    In order of problems listed above:  1. Palpitations  Normal LV function event monitor benign continue beta blocker   2. Shortness of breath He has chronic shortness of  breath with associated chest discomfort.  This is unchanged over many years.  He is followed by pulmonology (Dr. Chase Caller).  He has a history of interstitial lung disease. BNP mildly elevated lasix adjusted   3. Coronary artery disease History of CABG.  He has had subsequent stenting to the vein graft to the RCA and most recently in 2017 drug-eluting stent to the vein graft to the OM1/OM2.  He has chronic chest discomfort without significant change.  His ECG is unchanged.  No further testing indicated at this time.  Continue aspirin, beta-blocker, Plavix.  4. Aortic valve stenosis, etiology of cardiac valve disease unspecified Mild to moderate aortic stenosis by echocardiogram in October 2018.  No evidence of worsening on clinical evaluation today.  5. ILD (interstitial lung disease) ([place]) Continue follow-up with pulmonology as planned.  6. CKD stage 3 due to type 2 diabetes mellitus ([place])   - Plan: Basic Metabolic Panel (BMET)  7. Nausea and vomiting, intractability of vomiting not specified, unspecified vomiting type Post EGD with esophageal dilatation Henrene Pastor 02/2017 still needs anemia w/u on iron will f/u with GI In [name] for colonoscopy in May   Darian Ace

## 2017-05-17 DIAGNOSIS — R1310 Dysphagia, unspecified: Secondary | ICD-10-CM | POA: Diagnosis not present

## 2017-05-17 DIAGNOSIS — D509 Iron deficiency anemia, unspecified: Secondary | ICD-10-CM | POA: Diagnosis not present

## 2017-05-17 DIAGNOSIS — K219 Gastro-esophageal reflux disease without esophagitis: Secondary | ICD-10-CM | POA: Diagnosis not present

## 2017-05-17 DIAGNOSIS — M059 Rheumatoid arthritis with rheumatoid factor, unspecified: Secondary | ICD-10-CM | POA: Insufficient documentation

## 2017-05-17 DIAGNOSIS — N184 Chronic kidney disease, stage 4 (severe): Secondary | ICD-10-CM | POA: Diagnosis present

## 2017-05-17 DIAGNOSIS — I2510 Atherosclerotic heart disease of native coronary artery without angina pectoris: Secondary | ICD-10-CM | POA: Diagnosis present

## 2017-05-17 DIAGNOSIS — R112 Nausea with vomiting, unspecified: Secondary | ICD-10-CM | POA: Diagnosis not present

## 2017-05-17 DIAGNOSIS — E1149 Type 2 diabetes mellitus with other diabetic neurological complication: Secondary | ICD-10-CM

## 2017-05-17 DIAGNOSIS — I25119 Atherosclerotic heart disease of native coronary artery with unspecified angina pectoris: Secondary | ICD-10-CM | POA: Diagnosis present

## 2017-05-17 DIAGNOSIS — E1165 Type 2 diabetes mellitus with hyperglycemia: Secondary | ICD-10-CM

## 2017-05-18 DIAGNOSIS — R0689 Other abnormalities of breathing: Secondary | ICD-10-CM | POA: Diagnosis not present

## 2017-05-18 DIAGNOSIS — J8410 Pulmonary fibrosis, unspecified: Secondary | ICD-10-CM | POA: Diagnosis not present

## 2017-05-20 ENCOUNTER — Ambulatory Visit (INDEPENDENT_AMBULATORY_CARE_PROVIDER_SITE_OTHER): Payer: Medicare Other | Admitting: Cardiovascular Disease

## 2017-05-20 ENCOUNTER — Encounter: Payer: Self-pay | Admitting: Cardiovascular Disease

## 2017-05-20 VITALS — BP 128/74 | HR 75 | Ht 73.0 in | Wt 207.5 lb

## 2017-05-20 DIAGNOSIS — I25810 Atherosclerosis of coronary artery bypass graft(s) without angina pectoris: Secondary | ICD-10-CM | POA: Diagnosis not present

## 2017-05-20 DIAGNOSIS — I350 Nonrheumatic aortic (valve) stenosis: Secondary | ICD-10-CM | POA: Diagnosis not present

## 2017-05-20 DIAGNOSIS — R0602 Shortness of breath: Secondary | ICD-10-CM | POA: Diagnosis not present

## 2017-05-20 DIAGNOSIS — R002 Palpitations: Secondary | ICD-10-CM | POA: Diagnosis not present

## 2017-05-20 NOTE — Patient Instructions (Addendum)
Medication Instructions:  Your physician recommends that you continue on your current medications as directed. Please refer to the Current Medication list given to you today.  Labwork: NONE  Testing/Procedures: Your physician has requested that you have an echocardiogram in October with office visit same day. Echocardiography is a painless test that uses sound waves to create images of your heart. It provides your doctor with information about the size and shape of your heart and how well your heart's chambers and valves are working. This procedure takes approximately one hour. There are no restrictions for this procedure.  Follow-Up: Your physician wants you to follow-up in: October with Dr. Johnsie Cancel with echocardiogram same day. You will receive a reminder letter in the mail two months in advance. If you don't receive a letter, please call our office to schedule the follow-up appointment.   If you need a refill on your cardiac medications before your next appointment, please call your pharmacy.

## 2017-05-23 ENCOUNTER — Telehealth: Payer: Self-pay | Admitting: Internal Medicine

## 2017-05-23 NOTE — Telephone Encounter (Signed)
Call to pharmacy- they have Rx on file and will fill for patient and call him when it is ready.

## 2017-05-23 NOTE — Telephone Encounter (Signed)
Copied from [place]. Topic: Quick Communication - Rx Refill/Question >> May 23, 2017  2:12 PM Robina Ade, Helene Kelp D wrote: Medication: pantoprazole (PROTONIX) 40 MG tablet   Has the patient contacted their pharmacy? Yes   (Agent: If no, request that the patient contact the pharmacy for the refill.)   Preferred Pharmacy (with phone number or street name): [facility], [place]: Please be advised that RX refills may take up to 3 business days. We ask that you follow-up with your pharmacy.

## 2017-05-24 ENCOUNTER — Encounter: Payer: Self-pay | Admitting: Student in an Organized Health Care Education/Training Program

## 2017-05-24 ENCOUNTER — Other Ambulatory Visit: Payer: Self-pay

## 2017-05-24 ENCOUNTER — Ambulatory Visit
Payer: Medicare Other | Attending: Student in an Organized Health Care Education/Training Program | Admitting: Student in an Organized Health Care Education/Training Program

## 2017-05-24 VITALS — BP 153/96 | HR 69 | Temp 97.7°F | Resp 16 | Ht 73.0 in | Wt 207.0 lb

## 2017-05-24 DIAGNOSIS — Z951 Presence of aortocoronary bypass graft: Secondary | ICD-10-CM | POA: Insufficient documentation

## 2017-05-24 DIAGNOSIS — I130 Hypertensive heart and chronic kidney disease with heart failure and stage 1 through stage 4 chronic kidney disease, or unspecified chronic kidney disease: Secondary | ICD-10-CM | POA: Diagnosis not present

## 2017-05-24 DIAGNOSIS — Z7984 Long term (current) use of oral hypoglycemic drugs: Secondary | ICD-10-CM | POA: Insufficient documentation

## 2017-05-24 DIAGNOSIS — I350 Nonrheumatic aortic (valve) stenosis: Secondary | ICD-10-CM | POA: Diagnosis not present

## 2017-05-24 DIAGNOSIS — Z7982 Long term (current) use of aspirin: Secondary | ICD-10-CM | POA: Insufficient documentation

## 2017-05-24 DIAGNOSIS — G629 Polyneuropathy, unspecified: Secondary | ICD-10-CM | POA: Diagnosis not present

## 2017-05-24 DIAGNOSIS — J849 Interstitial pulmonary disease, unspecified: Secondary | ICD-10-CM | POA: Insufficient documentation

## 2017-05-24 DIAGNOSIS — G894 Chronic pain syndrome: Secondary | ICD-10-CM | POA: Insufficient documentation

## 2017-05-24 DIAGNOSIS — M170 Bilateral primary osteoarthritis of knee: Secondary | ICD-10-CM | POA: Diagnosis not present

## 2017-05-24 DIAGNOSIS — N183 Chronic kidney disease, stage 3 (moderate): Secondary | ICD-10-CM | POA: Diagnosis not present

## 2017-05-24 DIAGNOSIS — I5032 Chronic diastolic (congestive) heart failure: Secondary | ICD-10-CM | POA: Insufficient documentation

## 2017-05-24 DIAGNOSIS — Z87891 Personal history of nicotine dependence: Secondary | ICD-10-CM | POA: Insufficient documentation

## 2017-05-24 DIAGNOSIS — K449 Diaphragmatic hernia without obstruction or gangrene: Secondary | ICD-10-CM | POA: Diagnosis not present

## 2017-05-24 DIAGNOSIS — E1122 Type 2 diabetes mellitus with diabetic chronic kidney disease: Secondary | ICD-10-CM | POA: Insufficient documentation

## 2017-05-24 DIAGNOSIS — K219 Gastro-esophageal reflux disease without esophagitis: Secondary | ICD-10-CM | POA: Insufficient documentation

## 2017-05-24 DIAGNOSIS — Z79899 Other long term (current) drug therapy: Secondary | ICD-10-CM | POA: Insufficient documentation

## 2017-05-24 DIAGNOSIS — Z5181 Encounter for therapeutic drug level monitoring: Secondary | ICD-10-CM | POA: Diagnosis not present

## 2017-05-24 DIAGNOSIS — F329 Major depressive disorder, single episode, unspecified: Secondary | ICD-10-CM | POA: Insufficient documentation

## 2017-05-24 DIAGNOSIS — E785 Hyperlipidemia, unspecified: Secondary | ICD-10-CM | POA: Insufficient documentation

## 2017-05-24 DIAGNOSIS — M48061 Spinal stenosis, lumbar region without neurogenic claudication: Secondary | ICD-10-CM | POA: Insufficient documentation

## 2017-05-24 DIAGNOSIS — M069 Rheumatoid arthritis, unspecified: Secondary | ICD-10-CM | POA: Diagnosis not present

## 2017-05-24 DIAGNOSIS — M059 Rheumatoid arthritis with rheumatoid factor, unspecified: Secondary | ICD-10-CM | POA: Diagnosis not present

## 2017-05-24 DIAGNOSIS — I25119 Atherosclerotic heart disease of native coronary artery with unspecified angina pectoris: Secondary | ICD-10-CM | POA: Diagnosis not present

## 2017-05-24 DIAGNOSIS — E1142 Type 2 diabetes mellitus with diabetic polyneuropathy: Secondary | ICD-10-CM

## 2017-05-24 MED ORDER — OXYCODONE HCL 5 MG PO TABS: 5.0000 mg | ORAL_TABLET | Freq: Two times a day (BID) | ORAL | 0 refills | Status: DC | PRN

## 2017-05-24 NOTE — Progress Notes (Signed)
Nursing Pain Medication Assessment:  Safety precautions to be maintained throughout the outpatient stay will include: orient to surroundings, keep bed in low position, maintain call bell within reach at all times, provide assistance with transfer out of bed and ambulation.  Medication Inspection Compliance: [name] did not comply with our request to bring his pills to be counted. He was reminded that bringing the medication bottles, even when empty, is a requirement.  Medication: None brought in. Pill/Patch Count: None available to be counted. Bottle Appearance: No container available. Did not bring bottle(s) to appointment. Filled Date: N/A Last Medication intake:  Today 

## 2017-05-24 NOTE — Progress Notes (Signed)
Patient's Name: [name]  MRN: [id number]  Referring Provider: [name], MD  DOB: 09-Nov-1931  PCP: [name] Carbon, MD  DOS: 05/24/2017  Note by: [name] Santa, MD  Service setting: Ambulatory outpatient  Specialty: Interventional Pain Management  Location: [facility] (AMB) Pain Management Facility    Patient type: Established   Primary Reason(s) for Visit: Encounter for prescription drug management. (Level of risk: moderate)  CC: Foot Pain (bilaterally)  HPI  [name] is a 84 y.o. year old, male patient, who comes today for a medication management evaluation. He has HLD (hyperlipidemia); Obstructive sleep apnea; Essential hypertension; Thoracic aorta atherosclerosis ([place]); Reflux esophagitis; ESOPHAGEAL STRICTURE; GERD; DIVERTICULOSIS-COLON; BENIGN PROSTATIC HYPERTROPHY; ACTINIC KERATOSIS; SLEEP DISORDER, CHRONIC; Nausea; Chronic diastolic heart failure ([name]); ILD (interstitial lung disease) ([place]); CKD (chronic kidney disease) stage 4, GFR 15-29 ml/min ([place]); Routine general medical examination at a health care facility; Seropositive rheumatoid arthritis ([place]); Ventral hernia; DM (diabetes mellitus) type II controlled, neurological manifestation ([place]); Atherosclerotic heart disease of native coronary artery with angina pectoris ([place]); Respiratory failure, chronic ([place]); Spinal stenosis of lumbar region without neurogenic claudication; Spondylosis without myelopathy or radiculopathy, lumbar region; Tegretol-induced dizziness; Diarrhea; Orthostatic hypotension; High risk medication use; Primary osteoarthritis of both knees; History of right knee joint replacement; Dysphagia; and Esophageal stricture on their problem list. His primarily concern today is the Foot Pain (bilaterally)  Pain Assessment: Location: Right, Left Foot Radiating: ankles and feet bilaterally Onset: More than a month ago Duration: Chronic pain Quality: Sharp, Burning, Constant Severity: 5 /10 (self-reported pain  score)  Note: Reported level is compatible with observation.                         When using our objective Pain Scale, levels between 6 and 10/10 are said to belong in an emergency room, as it progressively worsens from a 6/10, described as severely limiting, requiring emergency care not usually available at an outpatient pain management facility. At a 6/10 level, communication becomes difficult and requires great effort. Assistance to reach the emergency department may be required. Facial flushing and profuse sweating along with potentially dangerous increases in heart rate and blood pressure will be evident. Effect on ADL:   Timing: Constant Modifying factors: denies  [name] was last scheduled for an appointment on 04/26/2017 for medication management. During today's appointment we reviewed [name] chronic pain status, as well as his outpatient medication regimen.  Patient was trialed on hydrocodone 5 mg twice daily as needed at the last visit.  He does not find this medication helpful.  He states that the medication may be worsening his pain.  We will trial oxycodone 5 mg twice daily as needed Otherwise no changes in his medical history.  The patient  reports that he does not use drugs. His body mass index is 27.31 kg/m.  Further details on both, my assessment(s), as well as the proposed treatment plan, please see below.  Controlled Substance Pharmacotherapy Assessment REMS (Risk Evaluation and Mitigation Strategy)  Analgesic: Hydrocodone 5 mg twice daily as needed --> oxycodone 5 mg twice daily as needed, quantity 79-monthMME/day: 10-->15 mg/day.  WRise Patience 05/24/2017 12:31 PM  Signed Nursing Pain Medication Assessment:  Safety precautions to be maintained throughout the outpatient stay will include: orient to surroundings, keep bed in low position, maintain call bell within reach at all times, provide assistance with transfer out of bed and ambulation.  Medication  Inspection Compliance: [name]  did not comply with our request to bring his pills to be counted. He was reminded that bringing the medication bottles, even when empty, is a requirement.  Medication: None brought in. Pill/Patch Count: None available to be counted. Bottle Appearance: No container available. Did not bring bottle(s) to appointment. Filled Date: N/A Last Medication intake:  Today   Pharmacokinetics: Liberation and absorption (onset of action): WNL Distribution (time to peak effect): WNL Metabolism and excretion (duration of action): WNL         Pharmacodynamics: Desired effects: Analgesia: [name] reports <50% benefit with hydrocodone, hence we will trial oxycodone today. Functional ability: Patient reports that medication allows him to accomplish basic ADLs Clinically meaningful improvement in function (CMIF): Sustained CMIF goals met Perceived effectiveness: Described as ineffective and would like to make some changes Undesirable effects: Side-effects or Adverse reactions: None reported Monitoring: [name] PMP: Online review of the past 46-monthperiod conducted. Compliant with practice rules and regulations Last UDS on record: Summary  Date Value Ref Range Status  03/17/2017 FINAL  Final    Comment:    ==================================================================== TOXASSURE COMP DRUG ANALYSIS,UR ==================================================================== Test                             Result       Flag       Units Drug Absent but Declared for Prescription Verification   Tramadol                       Not Detected UNEXPECTED ng/mg creat   Salicylate                     Not Detected UNEXPECTED    Aspirin, as indicated in the declared medication list, is not    always detected even when used as directed. ==================================================================== Test                      Result    Flag   Units      Ref Range   Creatinine               80               mg/dL      >=20 ==================================================================== Declared Medications:  The flagging and interpretation on this report are based on the  following declared medications.  Unexpected results may arise from  inaccuracies in the declared medications.  **Note: The testing scope of this panel includes these medications:  Tramadol  **Note: The testing scope of this panel does not include small to  moderate amounts of these reported medications:  Aspirin (Aspirin 81)  **Note: The testing scope of this panel does not include following  reported medications:  Carvedilol  Cephalexin  Clopidogrel  Furosemide  Metformin  Multivitamin  Mycophenolate mofetil (Cellcept)  Nitroglycerin  Ondansetron  Oxygen  Pantoprazole  Sodium Chloride  Sulfamethoxazole  Trimethoprim ==================================================================== For clinical consultation, please call ([phone] ====================================================================    UDS interpretation: Compliant          Medication Assessment Form: Reviewed. Patient indicates being compliant with therapy Treatment compliance: Compliant Risk Assessment Profile: Aberrant behavior: See prior evaluations. None observed or detected today Comorbid factors increasing risk of overdose: See prior notes. No additional risks detected today Risk of substance use disorder (SUD): Low Opioid Risk Tool - 05/24/17 1226      Family History of  Substance Abuse   Alcohol  Negative    Illegal Drugs  Negative    Rx Drugs  Negative      Personal History of Substance Abuse   Alcohol  Negative    Illegal Drugs  Negative    Rx Drugs  Negative      Age   Age between 85-45 years   No      History of Preadolescent Sexual Abuse   History of Preadolescent Sexual Abuse  Negative or Male      Psychological Disease   Psychological Disease  Negative    Depression   Negative      Total Score   Opioid Risk Tool Scoring  0    Opioid Risk Interpretation  Low Risk      ORT Scoring interpretation table:  Score <3 = Low Risk for SUD  Score between 4-7 = Moderate Risk for SUD  Score >8 = High Risk for Opioid Abuse   Risk Mitigation Strategies:  Patient Counseling: Covered Patient-Prescriber Agreement (PPA): Present and active  Notification to other healthcare providers: Done  Pharmacologic Plan: Discontinue hydrocodone.  Start oxycodone 5 mg twice daily as needed, quantity 38-month             Laboratory Chemistry  Inflammation Markers (CRP: Acute Phase) (ESR: Chronic Phase) Lab Results  Component Value Date   ESRSEDRATE 38 (H) 11/22/2011                         Rheumatology Markers Lab Results  Component Value Date   RF 23 (H) 11/22/2011   ANA NEG 11/22/2011                Renal Function Markers Lab Results  Component Value Date   BUN 31 (H) 04/29/2017   CREATININE 2.29 (H) 04/29/2017   GFRAA 30 (L) 04/08/2017   GFRNONAA 26 (L) 04/08/2017                 Hepatic Function Markers Lab Results  Component Value Date   AST 12 04/29/2017   ALT 9 04/29/2017   ALBUMIN 4.2 04/29/2017   ALKPHOS 67 04/29/2017                 Electrolytes Lab Results  Component Value Date   NA 137 04/29/2017   K 4.5 04/29/2017   CL 96 04/29/2017   CALCIUM 9.7 04/29/2017   MG 1.9 04/08/2017   PHOS 3.6 04/08/2016                        Neuropathy Markers Lab Results  Component Value Date   VITAMINB12 253 08/10/2016   FOLATE 17.6 08/10/2016   HGBA1C 8.0 (H) 04/18/2017                 Bone Pathology Markers No results found for: VD25OH, VD125OH2TOT, VCH8850YD7 [id number] 25OHVITD1, 25OHVITD2, 25OHVITD3, TESTOFREE, TESTOSTERONE                       Coagulation Parameters Lab Results  Component Value Date   INR 1.12 08/13/2015   LABPROT 14.6 08/13/2015   APTT 29 08/13/2015   PLT 304.0 04/29/2017                 Cardiovascular  Markers Lab Results  Component Value Date   BNP 318.4 (H) 08/13/2015   CKTOTAL 73 12/31/2011   CKMB 3.4 12/31/2011   TROPONINI  0.68 (HH) 08/14/2015   HGB 8.8 Repeated and verified X2. (L) 04/29/2017   HCT 28.0 (L) 04/29/2017                 CA Markers No results found for: CEA, CA125, LABCA2               Note: Lab results reviewed.  Recent Diagnostic Imaging Results  Cardiac event monitor SR isolated PAC;s and PVC;s  No significant arrhythmias  Complexity Note: Imaging results reviewed. Results shared with [name], using Layman's terms.                         Meds   Current Outpatient Medications:  .  aspirin EC 81 MG tablet, Take 81 mg by mouth at bedtime., Disp: , Rfl:  .  Blood Glucose Monitoring Suppl (ONE TOUCH ULTRA 2) w/Device KIT, Use to obtain blood sugar daily. Dx Code E11.40, Disp: 1 each, Rfl: 0 .  carvedilol (COREG) 3.125 MG tablet, Take 1 tablet (3.125 mg total) by mouth 2 (two) times daily with a meal., Disp: 180 tablet, Rfl: 3 .  cholecalciferol (VITAMIN D) 1000 units tablet, Take 1,000 Units by mouth daily., Disp: , Rfl:  .  clopidogrel (PLAVIX) 75 MG tablet, TAKE 1 TABLET BY MOUTH DAILY WITH BREAKFAST., Disp: 30 tablet, Rfl: 10 .  DHA-Vitamin C-Lutein (EYE HEALTH FORMULA PO), Take 1 tablet by mouth daily. , Disp: , Rfl:  .  ferrous sulfate 324 (65 Fe) MG TBEC, Take 1 tablet by mouth daily., Disp: , Rfl:  .  furosemide (LASIX) 40 MG tablet, TAKE 1 TO 2 TABLETS BY MOUTH EVERY DAY, Disp: 60 tablet, Rfl: 11 .  glucose blood (ONE TOUCH ULTRA TEST) test strip, USE TO CHECK BLOOD SUGAR ONCE A DAY Dx Code E11.40, Disp: 100 each, Rfl: 3 .  metFORMIN (GLUCOPHAGE) 1000 MG tablet, TAKE 1 TABLET BY MOUTH ONCE DAILY WITH BREAKFAST, Disp: 30 tablet, Rfl: 11 .  Multiple Vitamin (MULTIVITAMIN WITH MINERALS) TABS tablet, Take 1 tablet by mouth daily., Disp: , Rfl:  .  nitroGLYCERIN (NITROSTAT) 0.4 MG SL tablet, DISSOLVE 1 TABLET UNDER THE TONGUE FOR CHEST PAIN. MAY REPEAT  EVERY 5MINUTES UP TO 3 DOSES. IF NO RELIEF, CALL 911**, Disp: 25 tablet, Rfl: 5 .  ondansetron (ZOFRAN) 4 MG tablet, TAKE ONE TABLET BY MOUTH EVERY 8 HOURS AS NEEDED FOR NAUSEA OR VOMITING, Disp: 60 tablet, Rfl: 1 .  ONETOUCH DELICA LANCETS 40J MISC, , Disp: , Rfl:  .  OXYGEN, Inhale into the lungs. Per pt- Uses Oxygen 2 liters at night., Disp: , Rfl:  .  pantoprazole (PROTONIX) 40 MG tablet, TAKE 1 TABLET BY MOUTH TWICE (2) DAILY, Disp: 60 tablet, Rfl: 11 .  oxyCODONE (OXY IR/ROXICODONE) 5 MG immediate release tablet, Take 1 tablet (5 mg total) by mouth 2 (two) times daily as needed for severe pain., Disp: 60 tablet, Rfl: 0  ROS  Constitutional: Denies any fever or chills Gastrointestinal: No reported hemesis, hematochezia, vomiting, or acute GI distress Musculoskeletal: Denies any acute onset joint swelling, redness, loss of ROM, or weakness Neurological: No reported episodes of acute onset apraxia, aphasia, dysarthria, agnosia, amnesia, paralysis, loss of coordination, or loss of consciousness  Allergies  [name] is allergic to doxazosin mesylate and methocarbamol.  [place]  Drug: [name]  reports that he does not use drugs. Alcohol:  reports that he does not drink alcohol. Tobacco:  reports that he quit smoking about 54  years ago. His smoking use included cigarettes. He has a 20.00 pack-year smoking history. He has quit using smokeless tobacco. His smokeless tobacco use included chew. Medical:  has a past medical history of Arthritis, CAD (coronary artery disease), Chronic diastolic CHF (congestive heart failure) ([place]), Chronic kidney disease, stage III (moderate) ([place]), Chronic lower back pain, Colon polyps, Diverticulosis, Dyspnea (2009 since July -Sept), Enlarged prostate, Esophageal stricture, GERD (gastroesophageal reflux disease), Heart murmur, Hiatal hernia, History of kidney stones, History of PFTs, Hyperlipidemia, Hypertension, Interstitial lung disease ([place]), Nausea & vomiting,  Overweight (BMI 25.0-29.9), RA (rheumatoid arthritis) ([place]), Seropositive rheumatoid arthritis ([place]), and Type II diabetes mellitus ([place]). Surgical: Mr. Kau  has a past surgical history that includes Total knee arthroplasty (Right, 03/2010); Knee arthroscopy (Right, 2008); Cataract extraction w/ intraocular lens  implant, bilateral (Bilateral); Coronary artery bypass graft (11/1999); Coronary stent placement (02/2012); Shoulder arthroscopy with open rotator cuff repair and distal clavicle acrominectomy (Left, 02/27/2013); Trigger finger release (Left, 02/27/2013); Cholecystectomy open (11/2003); Joint replacement; Esophagogastroduodenoscopy (egd) with esophageal dilation (2010); Cardiac catheterization (08/2004); Cardiac catheterization (12/31/2011); Coronary angioplasty with stent (01/03/2012); Coronary angioplasty with stent (01/14/2014); Cardiac catheterization (2009); left and right heart catheterization with coronary angiogram (12/31/2011); percutaneous coronary stent intervention (pci-s) (12/31/2011); percutaneous coronary stent intervention (pci-s) (N/A, 01/03/2012); Percutaneous coronary intervention-balloon only (01/03/2012); left and right heart catheterization with coronary angiogram (N/A, 01/14/2014); Hand surgery; Cardiac catheterization (N/A, 08/13/2015); Esophagogastroduodenoscopy (N/A, 03/01/2017); and maloney dilation (03/01/2017). Family: family history includes Alcohol abuse in his sister; COPD in his mother; Colon cancer (age of onset: 67) in his brother; Diabetes in his brother; Heart attack in his father; Heart disease in his father; Stroke in his sister.  Constitutional Exam  General appearance: Well nourished, well developed, and well hydrated. In no apparent acute distress Vitals:   05/24/17 1215  BP: (!) 153/96  Pulse: 69  Resp: 16  Temp: 97.7 F (36.5 C)  TempSrc: Oral  SpO2: 98%  Weight: 207 lb (93.9 kg)  Height: '6\' 1"'$  (1.854 m)   BMI Assessment: Estimated body mass index is  27.31 kg/m as calculated from the following:   Height as of this encounter: '6\' 1"'$  (1.854 m).   Weight as of this encounter: 207 lb (93.9 kg).  BMI interpretation table: BMI level Category Range association with higher incidence of chronic pain  <18 kg/m2 Underweight   18.5-24.9 kg/m2 Ideal body weight   25-29.9 kg/m2 Overweight Increased incidence by 20%  30-34.9 kg/m2 Obese (Class I) Increased incidence by 68%  35-39.9 kg/m2 Severe obesity (Class II) Increased incidence by 136%  >40 kg/m2 Extreme obesity (Class III) Increased incidence by 254%   BMI Readings from Last 4 Encounters:  05/24/17 27.31 kg/m  05/20/17 27.38 kg/m  05/12/17 27.31 kg/m  04/29/17 27.02 kg/m   Wt Readings from Last 4 Encounters:  05/24/17 207 lb (93.9 kg)  05/20/17 207 lb 8 oz (94.1 kg)  05/12/17 207 lb (93.9 kg)  04/29/17 204 lb 12.8 oz (92.9 kg)  Psych/Mental status: Alert, oriented x 3 (person, place, & time)       Eyes: PERLA Respiratory: No evidence of acute respiratory distress  Cervical Spine Area Exam  Skin & Axial Inspection: No masses, redness, edema, swelling, or associated skin lesions Alignment: Symmetrical Functional ROM: Unrestricted ROM      Stability: No instability detected Muscle Tone/Strength: Functionally intact. No obvious neuro-muscular anomalies detected. Sensory (Neurological): Unimpaired Palpation: No palpable anomalies              Upper  Extremity (UE) Exam    Side: Right upper extremity  Side: Left upper extremity  Skin & Extremity Inspection: Skin color, temperature, and hair growth are WNL. No peripheral edema or cyanosis. No masses, redness, swelling, asymmetry, or associated skin lesions. No contractures.  Skin & Extremity Inspection: Skin color, temperature, and hair growth are WNL. No peripheral edema or cyanosis. No masses, redness, swelling, asymmetry, or associated skin lesions. No contractures.  Functional ROM: Unrestricted ROM          Functional ROM:  Unrestricted ROM          Muscle Tone/Strength: Functionally intact. No obvious neuro-muscular anomalies detected.  Muscle Tone/Strength: Functionally intact. No obvious neuro-muscular anomalies detected.  Sensory (Neurological): Unimpaired          Sensory (Neurological): Unimpaired          Palpation: No palpable anomalies              Palpation: No palpable anomalies              Specialized Test(s): Deferred         Specialized Test(s): Deferred          Thoracic Spine Area Exam  Skin & Axial Inspection: No masses, redness, or swelling Alignment: Symmetrical Functional ROM: Unrestricted ROM Stability: No instability detected Muscle Tone/Strength: Functionally intact. No obvious neuro-muscular anomalies detected. Sensory (Neurological): Unimpaired Muscle strength & Tone: No palpable anomalies  Lumbar Spine Area Exam  Skin & Axial Inspection: No masses, redness, or swelling Alignment: Symmetrical Functional ROM: Unrestricted ROM      Stability: No instability detected Muscle Tone/Strength: Functionally intact. No obvious neuro-muscular anomalies detected. Sensory (Neurological): Unimpaired Palpation: No palpable anomalies       Provocative Tests: Lumbar Hyperextension and rotation test: evaluation deferred today       Lumbar Lateral bending test: evaluation deferred today       Patrick's Maneuver: evaluation deferred today                    Gait & Posture Assessment  Ambulation: Unassisted Gait: Relatively normal for age and body habitus Posture: WNL   Lower Extremity Exam    Side: Right lower extremity  Side: Left lower extremity  Skin & Extremity Inspection: Skin color, temperature, and hair growth are WNL. No peripheral edema or cyanosis. No masses, redness, swelling, asymmetry, or associated skin lesions. No contractures.  Skin & Extremity Inspection: Skin color, temperature, and hair growth are WNL. No peripheral edema or cyanosis. No masses, redness, swelling, asymmetry,  or associated skin lesions. No contractures.  Functional ROM: Unrestricted ROM          Functional ROM: Unrestricted ROM          Muscle Tone/Strength: Functionally intact. No obvious neuro-muscular anomalies detected.  Muscle Tone/Strength: Functionally intact. No obvious neuro-muscular anomalies detected.  Sensory (Neurological): Unimpaired  Sensory (Neurological): Unimpaired  Palpation: No palpable anomalies  Palpation: No palpable anomalies   Assessment  Primary Diagnosis & Pertinent Problem List: The primary encounter diagnosis was Diabetic polyneuropathy associated with type 2 diabetes mellitus ([place]). Diagnoses of Neuropathy, Seropositive rheumatoid arthritis ([place]), CKD stage 3 due to type 2 diabetes mellitus ([place]), and Chronic pain syndrome were also pertinent to this visit.  Status Diagnosis  Persistent Persistent Persistent 1. Diabetic polyneuropathy associated with type 2 diabetes mellitus ([place])   2. Neuropathy   3. Seropositive rheumatoid arthritis ([place])   4. CKD stage 3 due to type 2 diabetes mellitus ([place])  5. Chronic pain syndrome     General Recommendations: The pain condition that the patient suffers from is best treated with a multidisciplinary approach that involves an increase in physical activity to prevent de-conditioning and worsening of the pain cycle, as well as psychological counseling (formal and/or informal) to address the co-morbid psychological affects of pain. Treatment will often involve judicious use of pain medications and interventional procedures to decrease the pain, allowing the patient to participate in the physical activity that will ultimately produce long-lasting pain reductions. The goal of the multidisciplinary approach is to return the patient to a higher level of overall function and to restore their ability to perform activities of daily living.  84 year old male with a history of coronary artery disease, GERD, interstitial lung disease, rheumatoid  arthritis, aortic stenosis, chronic kidney disease stage III who presents with lower extremity neuropathic pain secondary to diabetic polyneuropathy. Patient's diagnosis of diabetes was over 10-15 years ago. He is not on insulin. He describes burning and tingling sensation in his toes. This is worsened over time. This is progressed to the dorsum of his foot. Patient has been tried on various medications including desipramine, lidocaine cream, gabapentin at a dose of 900 mill grams in the morning, 600 mg in the afternoon, 600 mg at bedtime. This dose had to be reduced secondary to his chronic kidney disease. Patient was also tried on Lyrica 100 mill grams twice daily but it was too expensive for the patient to afford. Furthermore Lyrica resulted in leg swelling and further dose escalation was limited by patient's chronic kidney disease stage III. It was not very effective for his neuropathic pain symptoms. Patient also has tried carbamazepine which was not effective for his neuropathic pain. Patient has also had injections in his foot for his pain symptoms which were not effective.   Patient was trialed on hydrocodone 5 mg twice daily as needed at the last visit.  He does not find this medication helpful.  He states that the medication may be worsening his pain.  We will trial oxycodone 5 mgtwice daily as needed. Tramadol in the past resulted in insomnia. Otherwise no changes in his medical history.   Plan of Care  Pharmacotherapy (Medications Ordered): Meds ordered this encounter  Medications  . oxyCODONE (OXY IR/ROXICODONE) 5 MG immediate release tablet    Sig: Take 1 tablet (5 mg total) by mouth 2 (two) times daily as needed for severe pain.    Dispense:  60 tablet    Refill:  0    Do not place this medication, or any other prescription from our practice, on "Automatic Refill". Patient may have prescription filled one day early if pharmacy is closed on scheduled refill date.    Provider-requested follow-up: Return in about 4 weeks (around 06/21/2017) for Medication Management.   Time Note: Greater than 50% of the 25 minute(s) of face-to-face time spent with [name], was spent in counseling/coordination of care regarding: the treatment plan, treatment alternatives, medication side effects, the opioid analgesic risks and possible complications, the appropriate use of his medications and the medication agreement. Future Appointments  Date Time Provider [place]  06/21/2017 12:00 PM [name] Santa, MD [facility] None  06/24/2017 10:00 AM [name], MD [facility] None  08/01/2017 10:30 AM [facility] 6 MINUTE WALK [facility] None  08/01/2017 11:00 AM Brand Males, MD [facility] None  10/17/2017 11:00 AM [name] Carbon, MD [facility]    Primary Care Physician: [name] Carbon, MD Location: [facility] [facility]  Facility Note by: [name], M.D Date: 05/24/2017; Time: 2:27 PM  There are no Patient Instructions on file for this visit.

## 2017-05-27 ENCOUNTER — Other Ambulatory Visit: Payer: Self-pay | Admitting: *Deleted

## 2017-05-27 DIAGNOSIS — Z79899 Other long term (current) drug therapy: Secondary | ICD-10-CM

## 2017-05-28 LAB — CBC WITH DIFFERENTIAL/PLATELET
BASOS PCT: 1.2 %
Basophils Absolute: 108 cells/uL (ref 0–200)
EOS ABS: 540 {cells}/uL — AB (ref 15–500)
Eosinophils Relative: 6 %
HEMATOCRIT: 31.2 % — AB (ref 38.5–50.0)
Hemoglobin: 9.5 g/dL — ABNORMAL LOW (ref 13.2–17.1)
Lymphs Abs: 2196 cells/uL (ref 850–3900)
MCH: 21.5 pg — AB (ref 27.0–33.0)
MCHC: 30.4 g/dL — ABNORMAL LOW (ref 32.0–36.0)
MCV: 70.7 fL — AB (ref 80.0–100.0)
MPV: 10.7 fL (ref 7.5–12.5)
Monocytes Relative: 8 %
Neutro Abs: 5436 cells/uL (ref 1500–7800)
Neutrophils Relative %: 60.4 %
PLATELETS: 278 10*3/uL (ref 140–400)
RBC: 4.41 10*6/uL (ref 4.20–5.80)
RDW: 20.7 % — ABNORMAL HIGH (ref 11.0–15.0)
TOTAL LYMPHOCYTE: 24.4 %
WBC: 9 10*3/uL (ref 3.8–10.8)
WBCMIX: 720 {cells}/uL (ref 200–950)

## 2017-05-28 LAB — COMPLETE METABOLIC PANEL WITH GFR
AG RATIO: 1.2 (calc) (ref 1.0–2.5)
ALT: 9 U/L (ref 9–46)
AST: 15 U/L (ref 10–35)
Albumin: 4.2 g/dL (ref 3.6–5.1)
Alkaline phosphatase (APISO): 76 U/L (ref 40–115)
BILIRUBIN TOTAL: 0.7 mg/dL (ref 0.2–1.2)
BUN/Creatinine Ratio: 10 (calc) (ref 6–22)
BUN: 25 mg/dL (ref 7–25)
CALCIUM: 9.9 mg/dL (ref 8.6–10.3)
CHLORIDE: 99 mmol/L (ref 98–110)
CO2: 30 mmol/L (ref 20–32)
Creat: 2.43 mg/dL — ABNORMAL HIGH (ref 0.70–1.11)
GFR, EST AFRICAN AMERICAN: 27 mL/min/{1.73_m2} — AB (ref >=60)
GFR, EST NON AFRICAN AMERICAN: 24 mL/min/{1.73_m2} — AB (ref >=60)
GLOBULIN: 3.5 g/dL (ref 1.9–3.7)
Glucose, Bld: 168 mg/dL — ABNORMAL HIGH (ref 65–99)
POTASSIUM: 4.9 mmol/L (ref 3.5–5.3)
SODIUM: 141 mmol/L (ref 135–146)
TOTAL PROTEIN: 7.7 g/dL (ref 6.1–8.1)

## 2017-05-29 NOTE — Progress Notes (Signed)
He is off Bactrim and Cellcept now. Please forward labs to his PCP and Dr. Chase Caller. His GFR is lower. Anemia is better.

## 2017-06-10 ENCOUNTER — Other Ambulatory Visit: Payer: Self-pay | Admitting: Internal Medicine

## 2017-06-13 NOTE — Progress Notes (Signed)
Office Visit Note  Patient: [name]             Date of Birth: 02/13/1932           MRN: [id number]             PCP: [name], MD Referring: [name], MD Visit Date: 06/24/2017 Occupation: @GUAROCC @    Subjective:  Hand pain   History of Present Illness: [name] is a 84 y.o. male with history of seropositive rheumatoid arthritis, ILD, and osteoarthritis.  Patient states he continues to have bilateral hand pain and swelling.  He states his bilateral hands stay stiff all day.  He states he has bilateral knee pain. He reports his lower back is doing good.  He states that he followed up with a GI specialist at [facility], and was started on iron.  He states he is scheduled for a endoscopy and colonoscopy in May 2019.  He will be following up with the GI specialist on July 05, 2017.  He states his nausea has improved since discontinuing Cellcept and Bactrim.  He states his ILD has been stable.  He is following up with Dr. Chase Caller on August 01, 2017.       Activities of Daily Living:  Patient reports morning stiffness for  all day.   Patient Denies nocturnal pain.  Difficulty dressing/grooming: Denies Difficulty climbing stairs: Denies Difficulty getting out of chair: Reports Difficulty using hands for taps, buttons, cutlery, and/or writing: Denies   Review of Systems  Constitutional: Positive for fatigue. Negative for night sweats.  HENT: Positive for mouth dryness. Negative for mouth sores and nose dryness.   Eyes: Positive for dryness. Negative for redness.  Respiratory: Positive for difficulty breathing. Negative for cough, hemoptysis and shortness of breath.   Cardiovascular: Negative for chest pain, palpitations, hypertension, irregular heartbeat and swelling in legs/feet.  Gastrointestinal: Positive for constipation. Negative for blood in stool and diarrhea.  Endocrine: Negative for increased urination.  Genitourinary: Negative for painful urination.   Musculoskeletal: Positive for arthralgias, joint pain and morning stiffness. Negative for joint swelling, myalgias, muscle weakness, muscle tenderness and myalgias.  Skin: Negative for color change, pallor, rash, hair loss, nodules/bumps, skin tightness, ulcers and sensitivity to sunlight.  Allergic/Immunologic: Negative for susceptible to infections.  Neurological: Negative for dizziness, fainting, memory loss, night sweats and weakness.  Hematological: Negative for swollen glands.  Psychiatric/Behavioral: Negative for depressed mood and sleep disturbance. The patient is not nervous/anxious.     PMFS History:  Patient Active Problem List   Diagnosis Date Noted  . Dysphagia   . Esophageal stricture   . High risk medication use 10/21/2016  . Primary osteoarthritis of both knees 10/21/2016  . History of right knee joint replacement 10/21/2016  . Orthostatic hypotension 10/01/2016  . Diarrhea 08/18/2016  . Tegretol-induced dizziness 05/14/2016  . Spinal stenosis of lumbar region without neurogenic claudication 03/23/2016  . Spondylosis without myelopathy or radiculopathy, lumbar region 03/23/2016  . Respiratory failure, chronic ([place]) 11/21/2014  . DM (diabetes mellitus) type II controlled, neurological manifestation ([place])   . Atherosclerotic heart disease of native coronary artery with angina pectoris ([place])   . Ventral hernia 12/17/2013  . Seropositive rheumatoid arthritis ([place])   . Routine general medical examination at a health care facility 08/29/2012  . CKD (chronic kidney disease) stage 4, GFR 15-29 ml/min ([name])   . ILD (interstitial lung disease) ([place]) 11/28/2011  . Chronic diastolic heart failure ([place]) 09/14/2011  . Nausea  02/16/2011  . Obstructive sleep apnea 12/17/2008  . ESOPHAGEAL STRICTURE 10/09/2008  . Reflux esophagitis 09/10/2008  . DIVERTICULOSIS-COLON 09/10/2008  . Thoracic aorta atherosclerosis ([place]) 03/19/2008  . ACTINIC KERATOSIS 10/23/2007  . SLEEP DISORDER,  CHRONIC 10/17/2006  . HLD (hyperlipidemia) 09/23/2006  . Essential hypertension 09/23/2006  . GERD 09/23/2006  . BENIGN PROSTATIC HYPERTROPHY 09/23/2006    Past Medical History:  Diagnosis Date  . Arthritis    osteoarthritis, s/p R TKR, and digits  . CAD (coronary artery disease)    a. s/p CABG (2001)  b. s/p DES to RCA and cutting POBA to ostial PDA (2013)   c. s/p DES to SVG to OM2 (01/14/14) d. cath: 08/2015 NSTEMI w/ patent LIMA-LAD and 99% stenosis of SVG-OM w/ DES placed. CTO of SVG-RCA and SVG-D1.   [name] Chronic diastolic CHF (congestive heart failure) ([place])    a) 09/13 ECHO- LVEF 44-81%, grade 1 diastolic dysfunction, mild LA dilatation, atrial septal aneurysm, AV mobility restricted, but no sig AS by doppler; b) 09/04/08 ECHO- LVH, ef 60%, mild AS, c. echo 08/2015: EF perserved of 55-60% with inferolateral HK. Mild AS noted.  . Chronic kidney disease, stage III (moderate) ([place])   . Chronic lower back pain   . Colon polyps   . Diverticulosis   . Dyspnea 2009 since July -Sept   05/06/08-CPST-  normal effort, reduced VO2 max 20.5 /65%, reduced at 8.2/ 40%, normal breathing resetvca of 55%, submaximal heart rate response 112/77%, flattened o2 pluse response at peak exercise-12 ml/beat @ 85%, No VQ mismatch abnormalities, All c/w CIRC Limitation  . Enlarged prostate   . Esophageal stricture    a. s/p dilation spring 2010  . GERD (gastroesophageal reflux disease)   . Heart murmur   . Hiatal hernia   . History of kidney stones   . History of PFTs    mixed pattern on spiro. mild restn on lung volumes with near normal DLCO. Pattern can be explained by CABG scar. Fev1 2.2L/73%, ratio 68 (67), TLC 4.7/68%,RV 1.5L/55%,DLCO 79%  . Hyperlipidemia   . Hypertension   . Interstitial lung disease ([name])    NOS  . Nausea & vomiting    2018/2019  . Overweight (BMI 25.0-29.9)    BMI 29  . RA (rheumatoid arthritis) ([name])    [name]  . Seropositive rheumatoid arthritis ([place])   . Type II  diabetes mellitus ([name])     Family History  Problem Relation Age of Onset  . COPD Mother   . Heart disease Father   . Heart attack Father   . Diabetes Brother   . Colon cancer Brother 25  . Alcohol abuse Sister   . Stroke Sister    Past Surgical History:  Procedure Laterality Date  . CARDIAC CATHETERIZATION  08/2004   CP- no MI, Cath- small vessell disease   . CARDIAC CATHETERIZATION  12/31/2011   80% distal LM, 100% native LAD, LCx and RCA, 30% prox SVG-OM, SVG-D1 normal, 99% distal, 80% ostial SVG-RCA distal to graft, LIMA-LAD normal; LVEF mildly decreased with posterior basal AK   . CARDIAC CATHETERIZATION  2009   with patent grafts/notes 12/31/2011  . CARDIAC CATHETERIZATION N/A 08/13/2015   Procedure: Left Heart Cath and Cors/Grafts Angiography;  Surgeon: Sherren Mocha, MD;  Location: [facility];  Service: Cardiovascular;  Laterality: N/A;  . CATARACT EXTRACTION W/ INTRAOCULAR LENS  IMPLANT, BILATERAL Bilateral   . CHOLECYSTECTOMY OPEN  11/2003   [name]  . CORONARY ANGIOPLASTY WITH STENT PLACEMENT  01/03/2012   Successful DES to SVG-RCA and cutting balloon angioplasty ostial  PDA   . CORONARY ANGIOPLASTY WITH STENT PLACEMENT  01/14/2014   "1"  . CORONARY ARTERY BYPASS GRAFT  11/1999   CABG X5  . CORONARY STENT PLACEMENT  02/2012   1 stent and balloon  . ESOPHAGOGASTRODUODENOSCOPY N/A 03/01/2017   Procedure: ESOPHAGOGASTRODUODENOSCOPY (EGD);  Surgeon: Irene Shipper, MD;  Location[facility];  Service: Endoscopy;  Laterality: N/A;  . ESOPHAGOGASTRODUODENOSCOPY (EGD) WITH ESOPHAGEAL DILATION  2010  . HAND SURGERY    . JOINT REPLACEMENT    . KNEE ARTHROSCOPY Right 2008  . LEFT AND RIGHT HEART CATHETERIZATION WITH CORONARY ANGIOGRAM  12/31/2011   Procedure: LEFT AND RIGHT HEART CATHETERIZATION WITH CORONARY ANGIOGRAM;  Surgeon: Burnell Blanks, MD;  Location: [facility] CATH LAB;  Service: Cardiovascular;;  . LEFT AND RIGHT HEART CATHETERIZATION WITH CORONARY ANGIOGRAM  N/A 01/14/2014   Procedure: LEFT AND RIGHT HEART CATHETERIZATION WITH CORONARY ANGIOGRAM;  Surgeon: Peter M Martinique, MD;  Location: [facility] CATH LAB;  Service: Cardiovascular;  Laterality: N/A;  . MALONEY DILATION  03/01/2017   Procedure: Venia Minks DILATION;  Surgeon: Irene Shipper, MD;  Location: [facility];  Service: Endoscopy;;  . PERCUTANEOUS CORONARY INTERVENTION-BALLOON ONLY  01/03/2012   Procedure: PERCUTANEOUS CORONARY INTERVENTION-BALLOON ONLY;  Surgeon: Peter M Martinique, MD;  Location: [facility] CATH LAB;  Service: Cardiovascular;;  . PERCUTANEOUS CORONARY STENT INTERVENTION (PCI-S)  12/31/2011   Procedure: PERCUTANEOUS CORONARY STENT INTERVENTION (PCI-S);  Surgeon: Burnell Blanks, MD;  Location: [facility] CATH LAB;  Service: Cardiovascular;;  . PERCUTANEOUS CORONARY STENT INTERVENTION (PCI-S) N/A 01/03/2012   Procedure: PERCUTANEOUS CORONARY STENT INTERVENTION (PCI-S);  Surgeon: Peter M Martinique, MD;  Location: [facility] CATH LAB;  Service: Cardiovascular;  Laterality: N/A;  . SHOULDER ARTHROSCOPY WITH OPEN ROTATOR CUFF REPAIR AND DISTAL CLAVICLE ACROMINECTOMY Left 02/27/2013   Procedure: LEFT SHOULDER ARTHROSCOPY WITH MINI OPEN ROTATOR CUFF REPAIR AND SUBACROMIAL DECOMPRESSION AND DISTAL CLAVICLE RESECTION;  Surgeon: [name], MD;  Location: [facility];  Service: Orthopedics;  Laterality: Left;  . TOTAL KNEE ARTHROPLASTY Right 03/2010   [name]  . TRIGGER FINGER RELEASE Left 02/27/2013   Procedure: RELEASE TRIGGER FINGER/A-1 PULLEY;  Surgeon: [name], MD;  Location: [place] [facility];  Service: Orthopedics;  Laterality: Left;   Social History   Social History Narrative   No living will   Requests wife as health care POA   Discussed DNR --he requests this (done 08/29/12)   Not sure about feeding tube---but might accept for some time   Patient lives with wife and daughter in a one story home.  Has 3 children.  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     Objective: Vital Signs: BP  (!) 161/73 (BP Location: Left Arm, Patient Position: Sitting, Cuff Size: Normal)   Pulse 63   Resp 18   Ht 6\' 1"  (1.854 m)   Wt 210 lb (95.3 kg)   BMI 27.71 kg/m    Physical Exam  Constitutional: He is oriented to person, place, and time. He appears well-developed and well-nourished.  HENT:  Head: Normocephalic and atraumatic.  Eyes: Pupils are equal, round, and reactive to light. Conjunctivae and EOM are normal.  Neck: Normal range of motion. Neck supple.  Cardiovascular: Normal rate, regular rhythm and normal heart sounds.  Pulmonary/Chest: Effort normal and breath sounds normal.  Abdominal: Soft. Bowel sounds are normal.  Neurological: He is alert and oriented to person, place, and time.  Skin: Skin  is warm and dry. Capillary refill takes less than 2 seconds.  Psychiatric: He has a normal mood and affect. His behavior is normal.  Nursing note and vitals reviewed.    Musculoskeletal Exam: C-spine limited range of motion.  Thoracic kyphosis.  Lumbar spine limited ROM.  No midline spinal tenderness.  No SI joint tenderness.  Shoulder joints good ROM.  Mild right elbow contracture.  Wrist joints good ROM.  MCPs, PIPs, and DIPs good ROM with no synovitis.  He has PIP and DIP synovial thickening consistent with osteoarthritis.  He has subluxation of several DIP joints.  Hip joints, knee joints, ankle joints, MTPs, PIPs, and DIPs good ROM with no synovitis.    CDAI Exam: CDAI Homunculus Exam:   Joint Counts:  CDAI Tender Joint count: 0 CDAI Swollen Joint count: 0  Global Assessments:  Patient Global Assessment: 5   CDAI Calculated Score: 5    Investigation: No additional findings. CBC Latest Ref Rng & Units 05/27/2017 04/29/2017 04/08/2017  WBC 3.8 - 10.8 Thousand/uL 9.0 10.0 9.4  Hemoglobin 13.2 - 17.1 g/dL 9.5(L) 8.8 Repeated and verified X2.(L) 8.8(L)  Hematocrit 38.5 - 50.0 % 31.2(L) 28.0(L) 28.5(L)  Platelets 140 - 400 Thousand/uL 278 304.0 329   CMP Latest Ref Rng &  Units 05/27/2017 04/29/2017 04/08/2017  Glucose 65 - 99 mg/dL 168(H) 185(H) 206(H)  BUN 7 - 25 mg/dL 25 31(H) 21  Creatinine 0.70 - 1.11 mg/dL 2.43(H) 2.29(H) 2.28(H)  Sodium 135 - 146 mmol/L 141 137 139  Potassium 3.5 - 5.3 mmol/L 4.9 4.5 4.4  Chloride 98 - 110 mmol/L 99 96 96  CO2 20 - 32 mmol/L 30 31 24   Calcium 8.6 - 10.3 mg/dL 9.9 9.7 10.1  Total Protein 6.1 - 8.1 g/dL 7.7 8.1 -  Total Bilirubin 0.2 - 1.2 mg/dL 0.7 0.7 -  Alkaline Phos 39 - 117 U/L - 67 -  AST 10 - 35 U/L 15 12 -  ALT 9 - 46 U/L 9 9 -    Imaging: No results found.  Speciality Comments: No specialty comments available.    Procedures:  No procedures performed Allergies: Doxazosin mesylate and Methocarbamol   Assessment / Plan:     Visit Diagnoses: Seropositive rheumatoid arthritis ([place]) - +RF +CCP.  Patient has no synovitis on examination today.  He does have some synovial thickening and decreased range of motion of his hands.  ILD (interstitial lung disease) ([place]) - followed up by Dr. Chase Caller.  He was placed on CellCept per recommendations of Dr. Chase Caller.  But CellCept was discontinued as he developed severe anemia.  He was seen by the gastroenterologist and was placed on iron.  High risk medication use - discontinued Cellcept 500 mg po bid and Bactrim DS 1 tab po three times a week. - Plan: CBC with Differential/Platelet, COMPLETE METABOLIC PANEL WITH GFR will be obtained today.  I have advised him to discuss possible use of Imuran or some other medication for interstitial lung disease per Dr. Golden Pop recommendations.  He has appointment with him next month.  Primary osteoarthritis of left knee:Chronic pain  History of right knee joint replacement: Doing well  Spondylosis without myelopathy or radiculopathy, lumbar region on his discomfort off and on.  Chronic diastolic heart failure ([place]) - He is followed by cardiologist  History of anemia: Has been followed by gastroenterologist at [facility].  CKD  (chronic kidney disease) stage 4, GFR 15-29 ml/min ([name]) -  He is been followed by Dr. Florene Glen  History of  coronary artery disease - CABG  History of gastroesophageal reflux (GERD)  History of diabetes mellitus  History of hyperlipidemia  History of sleep apnea  History of hypertension    Orders: Orders Placed This Encounter  Procedures  . CBC with Differential/Platelet  . COMPLETE METABOLIC PANEL WITH GFR   No orders of the defined types were placed in this encounter.   Face-to-face time spent with patient was 30 minutes.  Greater than 50% of time was spent in counseling and coordination of care.  Follow-Up Instructions: Return in about 2 months (around 08/24/2017) for Rheumatoid arthritis,ILD.   [name], MD  Note - This record has been created using Editor, commissioning.  Chart creation errors have been sought, but may not always  have been located. Such creation errors do not reflect on  the standard of medical care.

## 2017-06-14 ENCOUNTER — Encounter: Payer: Medicare Other | Admitting: Student in an Organized Health Care Education/Training Program

## 2017-06-15 DIAGNOSIS — J8410 Pulmonary fibrosis, unspecified: Secondary | ICD-10-CM | POA: Diagnosis not present

## 2017-06-15 DIAGNOSIS — R0689 Other abnormalities of breathing: Secondary | ICD-10-CM | POA: Diagnosis not present

## 2017-06-20 DIAGNOSIS — D3131 Benign neoplasm of right choroid: Secondary | ICD-10-CM | POA: Diagnosis not present

## 2017-06-20 DIAGNOSIS — H353221 Exudative age-related macular degeneration, left eye, with active choroidal neovascularization: Secondary | ICD-10-CM | POA: Diagnosis not present

## 2017-06-20 DIAGNOSIS — H353213 Exudative age-related macular degeneration, right eye, with inactive scar: Secondary | ICD-10-CM | POA: Diagnosis not present

## 2017-06-20 DIAGNOSIS — H35373 Puckering of macula, bilateral: Secondary | ICD-10-CM | POA: Diagnosis not present

## 2017-06-21 ENCOUNTER — Other Ambulatory Visit: Payer: Self-pay

## 2017-06-21 ENCOUNTER — Encounter: Payer: Self-pay | Admitting: Student in an Organized Health Care Education/Training Program

## 2017-06-21 ENCOUNTER — Ambulatory Visit
Payer: Medicare Other | Attending: Student in an Organized Health Care Education/Training Program | Admitting: Student in an Organized Health Care Education/Training Program

## 2017-06-21 VITALS — BP 166/71 | HR 65 | Temp 98.1°F | Resp 16 | Ht 73.0 in | Wt 207.0 lb

## 2017-06-21 DIAGNOSIS — I5032 Chronic diastolic (congestive) heart failure: Secondary | ICD-10-CM | POA: Insufficient documentation

## 2017-06-21 DIAGNOSIS — M069 Rheumatoid arthritis, unspecified: Secondary | ICD-10-CM | POA: Insufficient documentation

## 2017-06-21 DIAGNOSIS — I130 Hypertensive heart and chronic kidney disease with heart failure and stage 1 through stage 4 chronic kidney disease, or unspecified chronic kidney disease: Secondary | ICD-10-CM | POA: Insufficient documentation

## 2017-06-21 DIAGNOSIS — K5790 Diverticulosis of intestine, part unspecified, without perforation or abscess without bleeding: Secondary | ICD-10-CM | POA: Diagnosis not present

## 2017-06-21 DIAGNOSIS — E1142 Type 2 diabetes mellitus with diabetic polyneuropathy: Secondary | ICD-10-CM | POA: Diagnosis not present

## 2017-06-21 DIAGNOSIS — K219 Gastro-esophageal reflux disease without esophagitis: Secondary | ICD-10-CM | POA: Insufficient documentation

## 2017-06-21 DIAGNOSIS — K222 Esophageal obstruction: Secondary | ICD-10-CM | POA: Insufficient documentation

## 2017-06-21 DIAGNOSIS — Z955 Presence of coronary angioplasty implant and graft: Secondary | ICD-10-CM | POA: Insufficient documentation

## 2017-06-21 DIAGNOSIS — E785 Hyperlipidemia, unspecified: Secondary | ICD-10-CM | POA: Insufficient documentation

## 2017-06-21 DIAGNOSIS — Z9981 Dependence on supplemental oxygen: Secondary | ICD-10-CM | POA: Diagnosis not present

## 2017-06-21 DIAGNOSIS — Z7982 Long term (current) use of aspirin: Secondary | ICD-10-CM | POA: Diagnosis not present

## 2017-06-21 DIAGNOSIS — Z951 Presence of aortocoronary bypass graft: Secondary | ICD-10-CM | POA: Insufficient documentation

## 2017-06-21 DIAGNOSIS — Z79891 Long term (current) use of opiate analgesic: Secondary | ICD-10-CM | POA: Insufficient documentation

## 2017-06-21 DIAGNOSIS — Z888 Allergy status to other drugs, medicaments and biological substances status: Secondary | ICD-10-CM | POA: Insufficient documentation

## 2017-06-21 DIAGNOSIS — Z87891 Personal history of nicotine dependence: Secondary | ICD-10-CM | POA: Insufficient documentation

## 2017-06-21 DIAGNOSIS — M059 Rheumatoid arthritis with rheumatoid factor, unspecified: Secondary | ICD-10-CM | POA: Diagnosis not present

## 2017-06-21 DIAGNOSIS — M48061 Spinal stenosis, lumbar region without neurogenic claudication: Secondary | ICD-10-CM | POA: Insufficient documentation

## 2017-06-21 DIAGNOSIS — G629 Polyneuropathy, unspecified: Secondary | ICD-10-CM

## 2017-06-21 DIAGNOSIS — Z833 Family history of diabetes mellitus: Secondary | ICD-10-CM | POA: Insufficient documentation

## 2017-06-21 DIAGNOSIS — M170 Bilateral primary osteoarthritis of knee: Secondary | ICD-10-CM | POA: Insufficient documentation

## 2017-06-21 DIAGNOSIS — Z8249 Family history of ischemic heart disease and other diseases of the circulatory system: Secondary | ICD-10-CM | POA: Insufficient documentation

## 2017-06-21 DIAGNOSIS — N400 Enlarged prostate without lower urinary tract symptoms: Secondary | ICD-10-CM | POA: Diagnosis not present

## 2017-06-21 DIAGNOSIS — Z7984 Long term (current) use of oral hypoglycemic drugs: Secondary | ICD-10-CM | POA: Insufficient documentation

## 2017-06-21 DIAGNOSIS — J849 Interstitial pulmonary disease, unspecified: Secondary | ICD-10-CM | POA: Diagnosis not present

## 2017-06-21 DIAGNOSIS — I350 Nonrheumatic aortic (valve) stenosis: Secondary | ICD-10-CM | POA: Insufficient documentation

## 2017-06-21 DIAGNOSIS — I25119 Atherosclerotic heart disease of native coronary artery with unspecified angina pectoris: Secondary | ICD-10-CM | POA: Insufficient documentation

## 2017-06-21 DIAGNOSIS — G894 Chronic pain syndrome: Secondary | ICD-10-CM | POA: Diagnosis not present

## 2017-06-21 DIAGNOSIS — N183 Chronic kidney disease, stage 3 unspecified: Secondary | ICD-10-CM

## 2017-06-21 DIAGNOSIS — Z9049 Acquired absence of other specified parts of digestive tract: Secondary | ICD-10-CM | POA: Insufficient documentation

## 2017-06-21 DIAGNOSIS — Z87442 Personal history of urinary calculi: Secondary | ICD-10-CM | POA: Insufficient documentation

## 2017-06-21 DIAGNOSIS — Z79899 Other long term (current) drug therapy: Secondary | ICD-10-CM | POA: Diagnosis not present

## 2017-06-21 DIAGNOSIS — Z96651 Presence of right artificial knee joint: Secondary | ICD-10-CM | POA: Insufficient documentation

## 2017-06-21 DIAGNOSIS — Z7902 Long term (current) use of antithrombotics/antiplatelets: Secondary | ICD-10-CM | POA: Insufficient documentation

## 2017-06-21 DIAGNOSIS — E1122 Type 2 diabetes mellitus with diabetic chronic kidney disease: Secondary | ICD-10-CM | POA: Insufficient documentation

## 2017-06-21 DIAGNOSIS — Z823 Family history of stroke: Secondary | ICD-10-CM | POA: Insufficient documentation

## 2017-06-21 DIAGNOSIS — Z86010 Personal history of colonic polyps: Secondary | ICD-10-CM | POA: Insufficient documentation

## 2017-06-21 DIAGNOSIS — Z800 Family history of malignant neoplasm of digestive organs: Secondary | ICD-10-CM | POA: Insufficient documentation

## 2017-06-21 DIAGNOSIS — K449 Diaphragmatic hernia without obstruction or gangrene: Secondary | ICD-10-CM | POA: Insufficient documentation

## 2017-06-21 DIAGNOSIS — Z9889 Other specified postprocedural states: Secondary | ICD-10-CM | POA: Insufficient documentation

## 2017-06-21 DIAGNOSIS — Z825 Family history of asthma and other chronic lower respiratory diseases: Secondary | ICD-10-CM | POA: Insufficient documentation

## 2017-06-21 DIAGNOSIS — Z811 Family history of alcohol abuse and dependence: Secondary | ICD-10-CM | POA: Insufficient documentation

## 2017-06-21 MED ORDER — HYDROCODONE-ACETAMINOPHEN 7.5-325 MG PO TABS: 1.0000 | ORAL_TABLET | Freq: Three times a day (TID) | ORAL | 0 refills | Status: DC | PRN

## 2017-06-21 NOTE — Progress Notes (Signed)
Patient's Name: [name]  MRN: [id number]  Referring Provider: [name], MD  DOB: 1932/03/13  PCP: [name] Carbon, MD  DOS: 06/21/2017  Note by: [name] Santa, MD  Service setting: Ambulatory outpatient  Specialty: Interventional Pain Management  Location: [facility] (AMB) Pain Management Facility    Patient type: Established   Primary Reason(s) for Visit: Encounter for prescription drug management. (Level of risk: moderate)  CC: Foot Pain (bilaterally)  HPI  [name] is a 84 y.o. year old, male patient, who comes today for a medication management evaluation. He has HLD (hyperlipidemia); Obstructive sleep apnea; Essential hypertension; Thoracic aorta atherosclerosis ([place]); Reflux esophagitis; ESOPHAGEAL STRICTURE; GERD; DIVERTICULOSIS-COLON; BENIGN PROSTATIC HYPERTROPHY; ACTINIC KERATOSIS; SLEEP DISORDER, CHRONIC; Nausea; Chronic diastolic heart failure ([name]); ILD (interstitial lung disease) ([place]); CKD (chronic kidney disease) stage 4, GFR 15-29 ml/min ([place]); Routine general medical examination at a health care facility; Seropositive rheumatoid arthritis ([place]); Ventral hernia; DM (diabetes mellitus) type II controlled, neurological manifestation ([place]); Atherosclerotic heart disease of native coronary artery with angina pectoris ([place]); Respiratory failure, chronic ([place]); Spinal stenosis of lumbar region without neurogenic claudication; Spondylosis without myelopathy or radiculopathy, lumbar region; Tegretol-induced dizziness; Diarrhea; Orthostatic hypotension; High risk medication use; Primary osteoarthritis of both knees; History of right knee joint replacement; Dysphagia; and Esophageal stricture on their problem list. His primarily concern today is the Foot Pain (bilaterally)  Pain Assessment: Location: Left, Right Foot Radiating: ankles Onset: More than a month ago Duration: Chronic pain Quality: Aching, Numbness(cold) Severity: 7 /10 (self-reported pain score)  Note: Reported  level is inconsistent with clinical observations. Clinically the patient looks like a 3/10             When using our objective Pain Scale, levels between 6 and 10/10 are said to belong in an emergency room, as it progressively worsens from a 6/10, described as severely limiting, requiring emergency care not usually available at an outpatient pain management facility. At a 6/10 level, communication becomes difficult and requires great effort. Assistance to reach the emergency department may be required. Facial flushing and profuse sweating along with potentially dangerous increases in heart rate and blood pressure will be evident. Effect on ADL: prolonged walking, standing Timing: Constant Modifying factors: denies  [name] was last scheduled for an appointment on 05/24/2017 for medication management. During today's appointment we reviewed [name] chronic pain status, as well as his outpatient medication regimen.  Follows up after starting Oxycodone 5 mg BID, doesn't not endorse significant benefit in terms of his bilateral feet neuropathic pain.  The patient  reports that he does not use drugs. His body mass index is 27.31 kg/m.  Further details on both, my assessment(s), as well as the proposed treatment plan, please see below.  Controlled Substance Pharmacotherapy Assessment REMS (Risk Evaluation and Mitigation Strategy)  Analgesic: Oxycodone 5 mill grams twice daily--> hydrocodone 7.5 mill grams 3 times daily MME/day: 22.5 mg/day.  [name] Specking, RN  06/21/2017 11:36 AM  Sign at close encounter Nursing Pain Medication Assessment:  Safety precautions to be maintained throughout the outpatient stay will include: orient to surroundings, keep bed in low position, maintain call bell within reach at all times, provide assistance with transfer out of bed and ambulation.  Medication Inspection Compliance: Pill count conducted under aseptic conditions, in front of the patient. Neither the  pills nor the bottle was removed from the patient's sight at any time. Once count was completed pills were immediately returned to the patient in their  original bottle.  Medication: See above Pill/Patch Count: 2 of 60 pills remain Pill/Patch Appearance: Markings consistent with prescribed medication Bottle Appearance: Standard pharmacy container. Clearly labeled. Filled Date: 2 / 19 / 2019 Last Medication intake:  Today   Pharmacokinetics: Liberation and absorption (onset of action): WNL Distribution (time to peak effect): WNL Metabolism and excretion (duration of action): WNL         Pharmacodynamics: Desired effects: Analgesia: [name] reports <50% benefit. Functional ability: Patient reports that medication allows him to accomplish basic ADLs Clinically meaningful improvement in function (CMIF): Sustained CMIF goals met Perceived effectiveness: Described as relatively effective but with some room for improvement Undesirable effects: Side-effects or Adverse reactions: None reported Monitoring: [place] PMP: Online review of the past 5-monthperiod conducted. Compliant with practice rules and regulations Last UDS on record: Summary  Date Value Ref Range Status  03/17/2017 FINAL  Final    Comment:    ==================================================================== TOXASSURE COMP DRUG ANALYSIS,UR ==================================================================== Test                             Result       Flag       Units Drug Absent but Declared for Prescription Verification   Tramadol                       Not Detected UNEXPECTED ng/mg creat   Salicylate                     Not Detected UNEXPECTED    Aspirin, as indicated in the declared medication list, is not    always detected even when used as directed. ==================================================================== Test                      Result    Flag   Units      Ref Range   Creatinine              80                mg/dL      >=20 ==================================================================== Declared Medications:  The flagging and interpretation on this report are based on the  following declared medications.  Unexpected results may arise from  inaccuracies in the declared medications.  **Note: The testing scope of this panel includes these medications:  Tramadol  **Note: The testing scope of this panel does not include small to  moderate amounts of these reported medications:  Aspirin (Aspirin 81)  **Note: The testing scope of this panel does not include following  reported medications:  Carvedilol  Cephalexin  Clopidogrel  Furosemide  Metformin  Multivitamin  Mycophenolate mofetil (Cellcept)  Nitroglycerin  Ondansetron  Oxygen  Pantoprazole  Sodium Chloride  Sulfamethoxazole  Trimethoprim ==================================================================== For clinical consultation, please call [phone] ====================================================================    UDS interpretation: Compliant          Medication Assessment Form: Reviewed. Patient indicates being compliant with therapy Treatment compliance: Compliant Risk Assessment Profile: Aberrant behavior: See prior evaluations. None observed or detected today Comorbid factors increasing risk of overdose: See prior notes. No additional risks detected today Risk of substance use disorder (SUD): Low Opioid Risk Tool - 06/21/17 1133      Family History of Substance Abuse   Alcohol  Negative    Illegal Drugs  Negative    Rx Drugs  Negative      Personal  History of Substance Abuse   Alcohol  Negative    Illegal Drugs  Negative    Rx Drugs  Negative      Total Score   Opioid Risk Tool Scoring  0    Opioid Risk Interpretation  Low Risk      ORT Scoring interpretation table:  Score <3 = Low Risk for SUD  Score between 4-7 = Moderate Risk for SUD  Score >8 = High Risk for Opioid Abuse    Risk Mitigation Strategies:  Patient Counseling: Covered Patient-Prescriber Agreement (PPA): Present and active  Notification to other healthcare providers: Done  Pharmacologic Plan: Change from oxycodone 5 mg twice daily to hydrocodone 7.5 mg 3 times daily as needed             Laboratory Chemistry  Inflammation Markers (CRP: Acute Phase) (ESR: Chronic Phase) Lab Results  Component Value Date   ESRSEDRATE 38 (H) 11/22/2011                         Rheumatology Markers Lab Results  Component Value Date   RF 23 (H) 11/22/2011   ANA NEG 11/22/2011                Renal Function Markers Lab Results  Component Value Date   BUN 25 05/27/2017   CREATININE 2.43 (H) 05/27/2017   GFRAA 27 (L) 05/27/2017   GFRNONAA 24 (L) 05/27/2017                 Hepatic Function Markers Lab Results  Component Value Date   AST 15 05/27/2017   ALT 9 05/27/2017   ALBUMIN 4.2 04/29/2017   ALKPHOS 67 04/29/2017                 Electrolytes Lab Results  Component Value Date   NA 141 05/27/2017   K 4.9 05/27/2017   CL 99 05/27/2017   CALCIUM 9.9 05/27/2017   MG 1.9 04/08/2017   PHOS 3.6 04/08/2016                        Neuropathy Markers Lab Results  Component Value Date   VITAMINB12 253 08/10/2016   FOLATE 17.6 08/10/2016   HGBA1C 8.0 (H) 04/18/2017                 Bone Pathology Markers No results found for: VD25OH, UV253GU4QIH, KV4259DG3, OV5643PI9, 25OHVITD1, 25OHVITD2, 25OHVITD3, TESTOFREE, TESTOSTERONE                       Coagulation Parameters Lab Results  Component Value Date   INR 1.12 08/13/2015   LABPROT 14.6 08/13/2015   APTT 29 08/13/2015   PLT 278 05/27/2017                 Cardiovascular Markers Lab Results  Component Value Date   BNP 318.4 (H) 08/13/2015   CKTOTAL 73 12/31/2011   CKMB 3.4 12/31/2011   TROPONINI 0.68 (HH) 08/14/2015   HGB 9.5 (L) 05/27/2017   HCT 31.2 (L) 05/27/2017                 CA Markers No results found for: CEA, CA125,  LABCA2               Note: Lab results reviewed.  Recent Diagnostic Imaging Results  Cardiac event monitor SR isolated PAC;s and PVC;s  No significant arrhythmias  Complexity Note: Imaging  results reviewed. Results shared with Mr. Mirsky, using Layman's terms.                         Meds   Current Outpatient Medications:  .  aspirin EC 81 MG tablet, Take 81 mg by mouth at bedtime., Disp: , Rfl:  .  Blood Glucose Monitoring Suppl (ONE TOUCH ULTRA 2) w/Device KIT, Use to obtain blood sugar daily. Dx Code E11.40, Disp: 1 each, Rfl: 0 .  carvedilol (COREG) 3.125 MG tablet, Take 1 tablet (3.125 mg total) by mouth 2 (two) times daily with a meal., Disp: 180 tablet, Rfl: 3 .  cholecalciferol (VITAMIN D) 1000 units tablet, Take 1,000 Units by mouth daily., Disp: , Rfl:  .  clopidogrel (PLAVIX) 75 MG tablet, TAKE 1 TABLET BY MOUTH DAILY WITH BREAKFAST., Disp: 30 tablet, Rfl: 10 .  DHA-Vitamin C-Lutein (EYE HEALTH FORMULA PO), Take 1 tablet by mouth daily. , Disp: , Rfl:  .  ferrous sulfate 324 (65 Fe) MG TBEC, Take 1 tablet by mouth daily., Disp: , Rfl:  .  furosemide (LASIX) 40 MG tablet, TAKE 1 TO 2 TABLETS BY MOUTH EVERY DAY, Disp: 60 tablet, Rfl: 11 .  glucose blood (ONE TOUCH ULTRA TEST) test strip, USE TO CHECK BLOOD SUGAR ONCE A DAY Dx Code E11.40, Disp: 100 each, Rfl: 3 .  metFORMIN (GLUCOPHAGE) 1000 MG tablet, TAKE 1 TABLET BY MOUTH ONCE DAILY WITH BREAKFAST, Disp: 30 tablet, Rfl: 11 .  Multiple Vitamin (MULTIVITAMIN WITH MINERALS) TABS tablet, Take 1 tablet by mouth daily., Disp: , Rfl:  .  nitroGLYCERIN (NITROSTAT) 0.4 MG SL tablet, DISSOLVE 1 TABLET UNDER THE TONGUE FOR CHEST PAIN. MAY REPEAT EVERY 5MINUTES UP TO 3 DOSES. IF NO RELIEF, CALL 911**, Disp: 25 tablet, Rfl: 5 .  ondansetron (ZOFRAN) 4 MG tablet, TAKE ONE TABLET BY MOUTH EVERY 8 HOURS AS NEEDED FOR NAUSEA OR VOMITING, Disp: 60 tablet, Rfl: 1 .  ONETOUCH DELICA LANCETS 96P MISC, , Disp: , Rfl:  .  OXYGEN, Inhale into the  lungs. Per pt- Uses Oxygen 2 liters at night., Disp: , Rfl:  .  pantoprazole (PROTONIX) 40 MG tablet, TAKE 1 TABLET BY MOUTH TWICE (2) DAILY, Disp: 60 tablet, Rfl: 11 .  HYDROcodone-acetaminophen (NORCO) 7.5-325 MG tablet, Take 1 tablet by mouth 3 (three) times daily as needed for moderate pain., Disp: 90 tablet, Rfl: 0  ROS  Constitutional: Denies any fever or chills Gastrointestinal: No reported hemesis, hematochezia, vomiting, or acute GI distress Musculoskeletal: Denies any acute onset joint swelling, redness, loss of ROM, or weakness Neurological: No reported episodes of acute onset apraxia, aphasia, dysarthria, agnosia, amnesia, paralysis, loss of coordination, or loss of consciousness  Allergies  Mr. Sissel is allergic to doxazosin mesylate and methocarbamol.  [place]  Drug: Mr. E[place]a  reports that he does not use drugs. Alcohol:  reports that he does not drink alcohol. Tobacco:  reports that he quit smoking about 54 years ago. His smoking use included cigarettes. He has a 20.00 pack-year smoking history. He has quit using smokeless tobacco. His smokeless tobacco use included chew. Medical:  has a past medical history of Arthritis, CAD (coronary artery disease), Chronic diastolic CHF (congestive heart failure) ([place]), Chronic kidney disease, stage III (moderate) ([place]), Chronic lower back pain, Colon polyps, Diverticulosis, Dyspnea (2009 since July -Sept), Enlarged prostate, Esophageal stricture, GERD (gastroesophageal reflux disease), Heart murmur, Hiatal hernia, History of kidney stones, History of PFTs, Hyperlipidemia, Hypertension, Interstitial lung disease ([place]),  Nausea & vomiting, Overweight (BMI 25.0-29.9), RA (rheumatoid arthritis) ([place]), Seropositive rheumatoid arthritis ([place]), and Type II diabetes mellitus ([place]). Surgical: Mr. Diiorio  has a past surgical history that includes Total knee arthroplasty (Right, 03/2010); Knee arthroscopy (Right, 2008); Cataract extraction w/ intraocular  lens  implant, bilateral (Bilateral); Coronary artery bypass graft (11/1999); Coronary stent placement (02/2012); Shoulder arthroscopy with open rotator cuff repair and distal clavicle acrominectomy (Left, 02/27/2013); Trigger finger release (Left, 02/27/2013); Cholecystectomy open (11/2003); Joint replacement; Esophagogastroduodenoscopy (egd) with esophageal dilation (2010); Cardiac catheterization (08/2004); Cardiac catheterization (12/31/2011); Coronary angioplasty with stent (01/03/2012); Coronary angioplasty with stent (01/14/2014); Cardiac catheterization (2009); left and right heart catheterization with coronary angiogram (12/31/2011); percutaneous coronary stent intervention (pci-s) (12/31/2011); percutaneous coronary stent intervention (pci-s) (N/A, 01/03/2012); Percutaneous coronary intervention-balloon only (01/03/2012); left and right heart catheterization with coronary angiogram (N/A, 01/14/2014); Hand surgery; Cardiac catheterization (N/A, 08/13/2015); Esophagogastroduodenoscopy (N/A, 03/01/2017); and maloney dilation (03/01/2017). Family: family history includes Alcohol abuse in his sister; COPD in his mother; Colon cancer (age of onset: 37) in his brother; Diabetes in his brother; Heart attack in his father; Heart disease in his father; Stroke in his sister.  Constitutional Exam  General appearance: Well nourished, well developed, and well hydrated. In no apparent acute distress Vitals:   06/21/17 1127  BP: (!) 166/71  Pulse: 65  Resp: 16  Temp: 98.1 F (36.7 C)  SpO2: 98%  Weight: 207 lb (93.9 kg)  Height: 6' 1" (1.854 m)   BMI Assessment: Estimated body mass index is 27.31 kg/m as calculated from the following:   Height as of this encounter: 6' 1" (1.854 m).   Weight as of this encounter: 207 lb (93.9 kg).  BMI interpretation table: BMI level Category Range association with higher incidence of chronic pain  <18 kg/m2 Underweight   18.5-24.9 kg/m2 Ideal body weight   25-29.9 kg/m2  Overweight Increased incidence by 20%  30-34.9 kg/m2 Obese (Class I) Increased incidence by 68%  35-39.9 kg/m2 Severe obesity (Class II) Increased incidence by 136%  >40 kg/m2 Extreme obesity (Class III) Increased incidence by 254%   BMI Readings from Last 4 Encounters:  06/21/17 27.31 kg/m  05/24/17 27.31 kg/m  05/20/17 27.38 kg/m  05/12/17 27.31 kg/m   Wt Readings from Last 4 Encounters:  06/21/17 207 lb (93.9 kg)  05/24/17 207 lb (93.9 kg)  05/20/17 207 lb 8 oz (94.1 kg)  05/12/17 207 lb (93.9 kg)  Psych/Mental status: Alert, oriented x 3 (person, place, & time)       Eyes: PERLA Respiratory: No evidence of acute respiratory distress  Cervical Spine Area Exam  Skin & Axial Inspection: No masses, redness, edema, swelling, or associated skin lesions Alignment: Symmetrical Functional ROM: Unrestricted ROM      Stability: No instability detected Muscle Tone/Strength: Functionally intact. No obvious neuro-muscular anomalies detected. Sensory (Neurological): Unimpaired Palpation: No palpable anomalies              Upper Extremity (UE) Exam    Side: Right upper extremity  Side: Left upper extremity  Skin & Extremity Inspection: Skin color, temperature, and hair growth are WNL. No peripheral edema or cyanosis. No masses, redness, swelling, asymmetry, or associated skin lesions. No contractures.  Skin & Extremity Inspection: Skin color, temperature, and hair growth are WNL. No peripheral edema or cyanosis. No masses, redness, swelling, asymmetry, or associated skin lesions. No contractures.  Functional ROM: Unrestricted ROM          Functional ROM: Unrestricted ROM  Muscle Tone/Strength: Functionally intact. No obvious neuro-muscular anomalies detected.  Muscle Tone/Strength: Functionally intact. No obvious neuro-muscular anomalies detected.  Sensory (Neurological): Unimpaired          Sensory (Neurological): Unimpaired          Palpation: No palpable anomalies               Palpation: No palpable anomalies              Specialized Test(s): Deferred         Specialized Test(s): Deferred          Thoracic Spine Area Exam  Skin & Axial Inspection: No masses, redness, or swelling Alignment: Symmetrical Functional ROM: Unrestricted ROM Stability: No instability detected Muscle Tone/Strength: Functionally intact. No obvious neuro-muscular anomalies detected. Sensory (Neurological): Unimpaired Muscle strength & Tone: No palpable anomalies  Lumbar Spine Area Exam  Skin & Axial Inspection: No masses, redness, or swelling Alignment: Symmetrical Functional ROM: Unrestricted ROM      Stability: No instability detected Muscle Tone/Strength: Functionally intact. No obvious neuro-muscular anomalies detected. Sensory (Neurological): Unimpaired Palpation: No palpable anomalies       Provocative Tests: Lumbar Hyperextension and rotation test: evaluation deferred today       Lumbar Lateral bending test: evaluation deferred today       Patrick's Maneuver: evaluation deferred today                    Gait & Posture Assessment  Ambulation: Unassisted Gait: Relatively normal for age and body habitus Posture: WNL   Lower Extremity Exam    Side: Right lower extremity  Side: Left lower extremity  Skin & Extremity Inspection: Skin color, temperature, and hair growth are WNL. No peripheral edema or cyanosis. No masses, redness, swelling, asymmetry, or associated skin lesions. No contractures.  Skin & Extremity Inspection: Skin color, temperature, and hair growth are WNL. No peripheral edema or cyanosis. No masses, redness, swelling, asymmetry, or associated skin lesions. No contractures.  Functional ROM: Unrestricted ROM          Functional ROM: Unrestricted ROM          Muscle Tone/Strength: Functionally intact. No obvious neuro-muscular anomalies detected.  Muscle Tone/Strength: Functionally intact. No obvious neuro-muscular anomalies detected.  Sensory (Neurological):  Paresthesia (Burning sensation)  Sensory (Neurological): Paresthesia (Burning sensation)  Palpation: No palpable anomalies  Palpation: No palpable anomalies   Assessment  Primary Diagnosis & Pertinent Problem List: The primary encounter diagnosis was Diabetic polyneuropathy associated with type 2 diabetes mellitus ([place]). Diagnoses of Neuropathy, Seropositive rheumatoid arthritis ([place]), CKD stage 3 due to type 2 diabetes mellitus ([place]), and Chronic pain syndrome were also pertinent to this visit.  Status Diagnosis  Not responding Persistent Persistent 1. Diabetic polyneuropathy associated with type 2 diabetes mellitus ([place])   2. Neuropathy   3. Seropositive rheumatoid arthritis ([place])   4. CKD stage 3 due to type 2 diabetes mellitus ([place])   5. Chronic pain syndrome      General Recommendations: The pain condition that the patient suffers from is best treated with a multidisciplinary approach that involves an increase in physical activity to prevent de-conditioning and worsening of the pain cycle, as well as psychological counseling (formal and/or informal) to address the co-morbid psychological affects of pain. Treatment will often involve judicious use of pain medications and interventional procedures to decrease the pain, allowing the patient to participate in the physical activity that will ultimately produce long-lasting pain reductions. The goal of the  multidisciplinary approach is to return the patient to a higher level of overall function and to restore their ability to perform activities of daily living.  84 year old male with a history of coronary artery disease, GERD, interstitial lung disease, rheumatoid arthritis, aortic stenosis, chronic kidney disease stage III who presents with lower extremity neuropathic pain secondary to diabetic polyneuropathy. Patient's diagnosis of diabetes was over 10-15 years ago. He is not on insulin. He describes burning and tingling sensation in his  toes. This is worsened over time. This is progressed to the dorsum of his foot. Patient has been tried on various medications including desipramine, lidocaine cream, gabapentin at a dose of 900 mill grams in the morning, 600 mg in the afternoon, 600 mg at bedtime. This dose had to be reduced secondary to his chronic kidney disease. Patient was also tried on Lyrica 100 mill grams twice daily but it was too expensive for the patient to afford. Furthermore Lyrica resulted in leg swelling and further dose escalation was limited by patient's chronic kidney disease stage III. It was not very effective for his neuropathic pain symptoms. Patient also has tried carbamazepine which was not effective for his neuropathic pain. Patient has also had injections in his foot for his pain symptoms which were not effective.   Follows up after starting Oxycodone 5 mg BID, doesn't not endorse significant benefit in terms of his bilateral feet neuropathic pain.  We will try hydrocodone 7.5 mg 3 times daily as needed.  Prescription provided below.  Patient to follow-up in 3 weeks.  Plan of Care  Pharmacotherapy (Medications Ordered): Meds ordered this encounter  Medications  . HYDROcodone-acetaminophen (NORCO) 7.5-325 MG tablet    Sig: Take 1 tablet by mouth 3 (three) times daily as needed for moderate pain.    Dispense:  90 tablet    Refill:  0    Do not place this medication, or any other prescription from our practice, on "Automatic Refill". Patient may have prescription filled one day early if pharmacy is closed on scheduled refill date.   For chronic pain, to last 1 month from fill date     Provider-requested follow-up: Return in about 3 weeks (around 07/12/2017) for Medication Management. Time Note: Greater than 50% of the 25 minute(s) of face-to-face time spent with Mr. Sane, was spent in counseling/coordination of care regarding: I think I have a couple more thatis trial is is is surgery is will be is  is however Mr. Barradas primary cause of pain, medication side effects, the opioid analgesic risks and possible complications, the appropriate use of his medications, realistic expectations and the medication agreement. Future Appointments  Date Time Provider [place]  06/24/2017 10:00 AM [name], MD [facility] None  07/07/2017 12:15 PM [name] Santa, MD [facility] None  08/01/2017 10:30 AM [facility] 6 MINUTE WALK [facility] None  08/01/2017 11:00 AM Brand Males, MD [facility] None  10/17/2017 11:00 AM [name] Carbon, MD [facility]  01/10/2018 11:30 AM [id number]    Primary Care Physician: [name] Carbon, MD Location: [facility] Note by: [name], M.D Date: 06/21/2017; Time: 1:43 PM  Patient Instructions  You have been given an Rx for Hydrocodone/APAP to last until 07/11/2017.____________________________________________________________________________________________  Genicular Nerve Block  What is a genicular nerve block? A genicular nerve block is the injection of a local anesthetic to block the nerves that transmits pain from the knee.  What is the purpose of a facet nerve block? A  genicular nerve block is a diagnostic procedure to determine if the pathologic changes (i.e. arthritis, meniscal tears, etc) and inflammation within the knee joint is the source of your knee pain. It also confirms that the knee pain will respond well to the actual treatment procedure. If a genicular nerve block works, it will give you relief for several hours. After that, the pain is expected to return to normal. This test is always performed twice (usually a week or two apart) because two successful tests are required to move onto treatment. If both diagnostic tests are positive, then we schedule a treatment called radiofrequency (RF) ablation. In this procedure, the same nerves are cauterized, which typically leads to  pain relief for 4 -18 months. If this process works well for one knee, it can be performed on the other knee if needed.  How is the procedure performed? You will be placed on the procedure table. The injection site is sterilized with either iodine or chlorhexadine. The site to be injected is numbed with a local anesthetic, and a needle is directed to the target area. X-ray guidance is used to ensure proper placement and positioning of the needle. When the needle is properly positioned near the genicular nerve, local anesthetic is injected to numb that nerve. This will be repeated at multiple sites around the knee to block all genicular nerves.  Will the procedure be painful? The injection can be painful and we therefore provide the option of receiving IV sedation. IV sedation, combined with local anesthetic, can make the injection nearly pain free. It allows you to remain very still during the procedure, which can also make the injection easier, faster, and more successful. If you decide to have IV sedation, you must have a driver to get you home safely afterwards. In addition, you cannot have anything to eat or drink within 8 hours of your appointment (clear liquids are allowed until 3 hours before the procedure). If you take medications for diabetes, these medications may need to be adjusted the morning of the procedure. Your primary care physician can help you with this adjustment.  What are the discharge instructions? If you received IV sedation do not drive or operate machinery for at least 24 hours after the procedure. You may return to work the next day following your procedure. You may resume your normal diet immediately. Do not engage in any strenuous activity for 24 hours. You should, however, engage in moderate activity that typically causes your ususal pain. If the block works, those activities should not be painful for several hours after the injection. Do not take a bath, swim, or use a hot  tub for 24 hours (you may take a shower). Call the office if you have any of the following: severe pain afterwards (different than your usual symptoms), redness/swelling/discharge at the injection site(s), fevers/chills, difficulty with bowel or bladder functions.  What are the risks and side effects? The complication rate for this procedure is very low. Whenever a needle enters the skin, bleeding or infection can occur. Some other serious but extremely rare risks include paralysis and death. You may have an allergic reaction to any of the medications used. If you have a known allergy to any medications, especially local anesthetics, notify our staff before the procedure takes place. You may experience any of the following side effects up to 4 - 6 hours after the procedure: . Leg muscle weakness or numbness may occur due to the local anesthetic affecting the nerves that control  your legs (this is a temporary affect and it is not paralysis). If you have any leg weakness or numbness, walk only with assistance in order to prevent falls and injury. Your leg strength will return slowly and completely. . Dizziness may occur due to a decrease in your blood pressure. If this occurs, remain in a seated or lying position. Gradually sit up, and then stand after at least 10 minutes of sitting. . Mild headaches may occur. Drink fluids and take pain medications if needed. If the headaches persist or become severe, call the office. . Mild discomfort at the injection site can occur. This typically lasts for a few hours but can persist for a couple days. If this occurs, take anti-inflammatories or pain medications, apply ice to the area the day of the procedure. If it persists, apply moist heat in the day(s) following.  The side effects listed above can be normal. They are not dangerous and will resolve on their own. If, however, you experience any of the following, a complication may have occurred and you should either  contact your doctor. If he is not readily available, then you should proceed to the closest urgent care center for evaluation: . Severe or progressive pain at the injection site(s) . Arm or leg weakness that progressively worsens or persists for longer than 8 hours . Severe or progressive redness, swelling, or discharge from the injections site(s) . Fevers, chills, nausea, or vomiting . Bowel or bladder dysfunction (i.e. inability to urinate or pass stool or difficulty controlling either)  How long does it take for the procedure to work? You should feel relief from your usual pain within the first hour. Again, this is only expected to last for several hours, at the most. Remember, you may be sore in the middle part of your back from the needles, and you must distinguish this from your usual pain. ____________________________________________________________________________________________

## 2017-06-21 NOTE — Progress Notes (Signed)
Nursing Pain Medication Assessment:  Safety precautions to be maintained throughout the outpatient stay will include: orient to surroundings, keep bed in low position, maintain call bell within reach at all times, provide assistance with transfer out of bed and ambulation.  Medication Inspection Compliance: Pill count conducted under aseptic conditions, in front of the patient. Neither the pills nor the bottle was removed from the patient's sight at any time. Once count was completed pills were immediately returned to the patient in their original bottle.  Medication: See above Pill/Patch Count: 2 of 60 pills remain Pill/Patch Appearance: Markings consistent with prescribed medication Bottle Appearance: Standard pharmacy container. Clearly labeled. Filled Date: 2 / 19 / 2019 Last Medication intake:  Today

## 2017-06-21 NOTE — Patient Instructions (Addendum)
You have been given an Rx for Hydrocodone/APAP to last until 07/11/2017

## 2017-06-24 ENCOUNTER — Ambulatory Visit (INDEPENDENT_AMBULATORY_CARE_PROVIDER_SITE_OTHER): Payer: Medicare Other | Admitting: Rheumatology

## 2017-06-24 ENCOUNTER — Encounter: Payer: Self-pay | Admitting: Rheumatology

## 2017-06-24 VITALS — BP 161/73 | HR 63 | Resp 18 | Ht 73.0 in | Wt 210.0 lb

## 2017-06-24 DIAGNOSIS — J849 Interstitial pulmonary disease, unspecified: Secondary | ICD-10-CM

## 2017-06-24 DIAGNOSIS — I5032 Chronic diastolic (congestive) heart failure: Secondary | ICD-10-CM

## 2017-06-24 DIAGNOSIS — M47816 Spondylosis without myelopathy or radiculopathy, lumbar region: Secondary | ICD-10-CM

## 2017-06-24 DIAGNOSIS — Z862 Personal history of diseases of the blood and blood-forming organs and certain disorders involving the immune mechanism: Secondary | ICD-10-CM | POA: Diagnosis not present

## 2017-06-24 DIAGNOSIS — M059 Rheumatoid arthritis with rheumatoid factor, unspecified: Secondary | ICD-10-CM | POA: Diagnosis not present

## 2017-06-24 DIAGNOSIS — M1712 Unilateral primary osteoarthritis, left knee: Secondary | ICD-10-CM | POA: Diagnosis not present

## 2017-06-24 DIAGNOSIS — Z96651 Presence of right artificial knee joint: Secondary | ICD-10-CM

## 2017-06-24 DIAGNOSIS — N184 Chronic kidney disease, stage 4 (severe): Secondary | ICD-10-CM | POA: Diagnosis not present

## 2017-06-24 DIAGNOSIS — Z8679 Personal history of other diseases of the circulatory system: Secondary | ICD-10-CM

## 2017-06-24 DIAGNOSIS — Z8639 Personal history of other endocrine, nutritional and metabolic disease: Secondary | ICD-10-CM | POA: Diagnosis not present

## 2017-06-24 DIAGNOSIS — Z8719 Personal history of other diseases of the digestive system: Secondary | ICD-10-CM

## 2017-06-24 DIAGNOSIS — Z79899 Other long term (current) drug therapy: Secondary | ICD-10-CM | POA: Diagnosis not present

## 2017-06-24 DIAGNOSIS — Z8669 Personal history of other diseases of the nervous system and sense organs: Secondary | ICD-10-CM

## 2017-06-27 LAB — CBC WITH DIFFERENTIAL/PLATELET
BASOS ABS: 78 {cells}/uL (ref 0–200)
Basophils Relative: 1 %
Eosinophils Absolute: 554 cells/uL — ABNORMAL HIGH (ref 15–500)
Eosinophils Relative: 7.1 %
HEMATOCRIT: 38.2 % — AB (ref 38.5–50.0)
Hemoglobin: 12 g/dL — ABNORMAL LOW (ref 13.2–17.1)
LYMPHS ABS: 1693 {cells}/uL (ref 850–3900)
MCH: 24.9 pg — ABNORMAL LOW (ref 27.0–33.0)
MCHC: 31.4 g/dL — AB (ref 32.0–36.0)
MCV: 79.3 fL — AB (ref 80.0–100.0)
MPV: 10.5 fL (ref 7.5–12.5)
Monocytes Relative: 9.6 %
NEUTROS PCT: 60.6 %
Neutro Abs: 4727 cells/uL (ref 1500–7800)
Platelets: 201 10*3/uL (ref 140–400)
RBC: 4.82 10*6/uL (ref 4.20–5.80)
Total Lymphocyte: 21.7 %
WBC: 7.8 10*3/uL (ref 3.8–10.8)
WBCMIX: 749 {cells}/uL (ref 200–950)

## 2017-06-27 LAB — COMPLETE METABOLIC PANEL WITH GFR
AG Ratio: 1.1 (calc) (ref 1.0–2.5)
ALBUMIN MSPROF: 4 g/dL (ref 3.6–5.1)
ALKALINE PHOSPHATASE (APISO): 69 U/L (ref 40–115)
ALT: 10 U/L (ref 9–46)
AST: 17 U/L (ref 10–35)
BUN / CREAT RATIO: 13 (calc) (ref 6–22)
BUN: 30 mg/dL — ABNORMAL HIGH (ref 7–25)
CO2: 32 mmol/L (ref 20–32)
Calcium: 10 mg/dL (ref 8.6–10.3)
Chloride: 102 mmol/L (ref 98–110)
Creat: 2.28 mg/dL — ABNORMAL HIGH (ref 0.70–1.11)
GFR, EST NON AFRICAN AMERICAN: 26 mL/min/{1.73_m2} — AB (ref >=60)
GFR, Est African American: 30 mL/min/{1.73_m2} — ABNORMAL LOW (ref >=60)
GLOBULIN: 3.6 g/dL (ref 1.9–3.7)
Glucose, Bld: 235 mg/dL — ABNORMAL HIGH (ref 65–99)
POTASSIUM: 4.6 mmol/L (ref 3.5–5.3)
Sodium: 142 mmol/L (ref 135–146)
Total Bilirubin: 0.6 mg/dL (ref 0.2–1.2)
Total Protein: 7.6 g/dL (ref 6.1–8.1)

## 2017-06-27 NOTE — Progress Notes (Signed)
His hemoglobin has improved since he has come off CellCept.  Please forward the labs to Dr. Chase Caller.  He will be seeing Dr. Chase Caller for future treatment.

## 2017-06-28 DIAGNOSIS — L820 Inflamed seborrheic keratosis: Secondary | ICD-10-CM | POA: Diagnosis not present

## 2017-06-28 DIAGNOSIS — D225 Melanocytic nevi of trunk: Secondary | ICD-10-CM | POA: Diagnosis not present

## 2017-06-28 DIAGNOSIS — L821 Other seborrheic keratosis: Secondary | ICD-10-CM | POA: Diagnosis not present

## 2017-06-28 DIAGNOSIS — L570 Actinic keratosis: Secondary | ICD-10-CM | POA: Diagnosis not present

## 2017-06-28 DIAGNOSIS — L720 Epidermal cyst: Secondary | ICD-10-CM | POA: Diagnosis not present

## 2017-07-05 DIAGNOSIS — K5900 Constipation, unspecified: Secondary | ICD-10-CM | POA: Insufficient documentation

## 2017-07-05 DIAGNOSIS — D509 Iron deficiency anemia, unspecified: Secondary | ICD-10-CM | POA: Insufficient documentation

## 2017-07-07 ENCOUNTER — Encounter: Payer: Self-pay | Admitting: Student in an Organized Health Care Education/Training Program

## 2017-07-07 ENCOUNTER — Other Ambulatory Visit: Payer: Self-pay

## 2017-07-07 ENCOUNTER — Ambulatory Visit
Payer: Medicare Other | Attending: Student in an Organized Health Care Education/Training Program | Admitting: Student in an Organized Health Care Education/Training Program

## 2017-07-07 VITALS — BP 185/82 | HR 66 | Temp 97.5°F | Resp 16 | Ht 73.0 in | Wt 209.0 lb

## 2017-07-07 DIAGNOSIS — M170 Bilateral primary osteoarthritis of knee: Secondary | ICD-10-CM | POA: Insufficient documentation

## 2017-07-07 DIAGNOSIS — Z79899 Other long term (current) drug therapy: Secondary | ICD-10-CM | POA: Diagnosis not present

## 2017-07-07 DIAGNOSIS — Z823 Family history of stroke: Secondary | ICD-10-CM | POA: Insufficient documentation

## 2017-07-07 DIAGNOSIS — I5032 Chronic diastolic (congestive) heart failure: Secondary | ICD-10-CM | POA: Insufficient documentation

## 2017-07-07 DIAGNOSIS — E1122 Type 2 diabetes mellitus with diabetic chronic kidney disease: Secondary | ICD-10-CM | POA: Diagnosis not present

## 2017-07-07 DIAGNOSIS — E1142 Type 2 diabetes mellitus with diabetic polyneuropathy: Secondary | ICD-10-CM | POA: Diagnosis not present

## 2017-07-07 DIAGNOSIS — Z7984 Long term (current) use of oral hypoglycemic drugs: Secondary | ICD-10-CM | POA: Insufficient documentation

## 2017-07-07 DIAGNOSIS — Z9842 Cataract extraction status, left eye: Secondary | ICD-10-CM | POA: Insufficient documentation

## 2017-07-07 DIAGNOSIS — Z833 Family history of diabetes mellitus: Secondary | ICD-10-CM | POA: Diagnosis not present

## 2017-07-07 DIAGNOSIS — Z7902 Long term (current) use of antithrombotics/antiplatelets: Secondary | ICD-10-CM | POA: Insufficient documentation

## 2017-07-07 DIAGNOSIS — Z7982 Long term (current) use of aspirin: Secondary | ICD-10-CM | POA: Diagnosis not present

## 2017-07-07 DIAGNOSIS — Z87891 Personal history of nicotine dependence: Secondary | ICD-10-CM | POA: Diagnosis not present

## 2017-07-07 DIAGNOSIS — E785 Hyperlipidemia, unspecified: Secondary | ICD-10-CM | POA: Insufficient documentation

## 2017-07-07 DIAGNOSIS — M059 Rheumatoid arthritis with rheumatoid factor, unspecified: Secondary | ICD-10-CM | POA: Insufficient documentation

## 2017-07-07 DIAGNOSIS — I2510 Atherosclerotic heart disease of native coronary artery without angina pectoris: Secondary | ICD-10-CM | POA: Diagnosis not present

## 2017-07-07 DIAGNOSIS — G4733 Obstructive sleep apnea (adult) (pediatric): Secondary | ICD-10-CM | POA: Insufficient documentation

## 2017-07-07 DIAGNOSIS — Z87442 Personal history of urinary calculi: Secondary | ICD-10-CM | POA: Insufficient documentation

## 2017-07-07 DIAGNOSIS — Z86010 Personal history of colonic polyps: Secondary | ICD-10-CM | POA: Insufficient documentation

## 2017-07-07 DIAGNOSIS — Z9049 Acquired absence of other specified parts of digestive tract: Secondary | ICD-10-CM | POA: Insufficient documentation

## 2017-07-07 DIAGNOSIS — Z9841 Cataract extraction status, right eye: Secondary | ICD-10-CM | POA: Insufficient documentation

## 2017-07-07 DIAGNOSIS — M47816 Spondylosis without myelopathy or radiculopathy, lumbar region: Secondary | ICD-10-CM | POA: Diagnosis not present

## 2017-07-07 DIAGNOSIS — N400 Enlarged prostate without lower urinary tract symptoms: Secondary | ICD-10-CM | POA: Insufficient documentation

## 2017-07-07 DIAGNOSIS — G629 Polyneuropathy, unspecified: Secondary | ICD-10-CM

## 2017-07-07 DIAGNOSIS — N183 Chronic kidney disease, stage 3 (moderate): Secondary | ICD-10-CM | POA: Insufficient documentation

## 2017-07-07 DIAGNOSIS — Z888 Allergy status to other drugs, medicaments and biological substances status: Secondary | ICD-10-CM | POA: Insufficient documentation

## 2017-07-07 DIAGNOSIS — M79673 Pain in unspecified foot: Secondary | ICD-10-CM | POA: Diagnosis present

## 2017-07-07 DIAGNOSIS — Z96651 Presence of right artificial knee joint: Secondary | ICD-10-CM | POA: Insufficient documentation

## 2017-07-07 DIAGNOSIS — Z800 Family history of malignant neoplasm of digestive organs: Secondary | ICD-10-CM | POA: Insufficient documentation

## 2017-07-07 DIAGNOSIS — I130 Hypertensive heart and chronic kidney disease with heart failure and stage 1 through stage 4 chronic kidney disease, or unspecified chronic kidney disease: Secondary | ICD-10-CM | POA: Insufficient documentation

## 2017-07-07 DIAGNOSIS — Z961 Presence of intraocular lens: Secondary | ICD-10-CM | POA: Insufficient documentation

## 2017-07-07 DIAGNOSIS — J849 Interstitial pulmonary disease, unspecified: Secondary | ICD-10-CM | POA: Insufficient documentation

## 2017-07-07 DIAGNOSIS — K219 Gastro-esophageal reflux disease without esophagitis: Secondary | ICD-10-CM | POA: Diagnosis not present

## 2017-07-07 DIAGNOSIS — Z811 Family history of alcohol abuse and dependence: Secondary | ICD-10-CM | POA: Diagnosis not present

## 2017-07-07 DIAGNOSIS — I350 Nonrheumatic aortic (valve) stenosis: Secondary | ICD-10-CM | POA: Diagnosis not present

## 2017-07-07 DIAGNOSIS — Z955 Presence of coronary angioplasty implant and graft: Secondary | ICD-10-CM | POA: Insufficient documentation

## 2017-07-07 DIAGNOSIS — M48061 Spinal stenosis, lumbar region without neurogenic claudication: Secondary | ICD-10-CM | POA: Insufficient documentation

## 2017-07-07 DIAGNOSIS — Z825 Family history of asthma and other chronic lower respiratory diseases: Secondary | ICD-10-CM | POA: Diagnosis not present

## 2017-07-07 DIAGNOSIS — Z951 Presence of aortocoronary bypass graft: Secondary | ICD-10-CM | POA: Insufficient documentation

## 2017-07-07 MED ORDER — HYDROCODONE-ACETAMINOPHEN 10-325 MG PO TABS: 1.0000 | ORAL_TABLET | Freq: Three times a day (TID) | ORAL | 0 refills | Status: DC | PRN

## 2017-07-07 NOTE — Patient Instructions (Signed)
You have been given 2 scripts for hydrocodone today.  They will last you until 09-12-17

## 2017-07-07 NOTE — Progress Notes (Signed)
Patient's Name: [name]  MRN: [id number]  Referring Provider: [name], MD  DOB: 11/13/1931  PCP: [name], MD  DOS: 07/07/2017  Note by: Gillis Santa, MD  Service setting: Ambulatory outpatient  Specialty: Interventional Pain Management  Location: [facility] (AMB) Pain Management Facility    Patient type: Established   Primary Reason(s) for Visit: Encounter for prescription drug management. (Level of risk: moderate)  CC: Foot Pain  HPI  [name] is a 84 y.o. year old, male patient, who comes today for a medication management evaluation. He has HLD (hyperlipidemia); Obstructive sleep apnea; Essential hypertension; Thoracic aorta atherosclerosis ([place]); Reflux esophagitis; ESOPHAGEAL STRICTURE; GERD; DIVERTICULOSIS-COLON; BENIGN PROSTATIC HYPERTROPHY; ACTINIC KERATOSIS; SLEEP DISORDER, CHRONIC; Nausea; Chronic diastolic heart failure ([name]); ILD (interstitial lung disease) ([place]); CKD (chronic kidney disease) stage 4, GFR 15-29 ml/min ([place]); Routine general medical examination at a health care facility; Seropositive rheumatoid arthritis ([place]); Ventral hernia; DM (diabetes mellitus) type II controlled, neurological manifestation ([place]); Atherosclerotic heart disease of native coronary artery with angina pectoris ([place]); Respiratory failure, chronic ([place]); Spinal stenosis of lumbar region without neurogenic claudication; Spondylosis without myelopathy or radiculopathy, lumbar region; Tegretol-induced dizziness; Diarrhea; Orthostatic hypotension; High risk medication use; Primary osteoarthritis of both knees; History of right knee joint replacement; Dysphagia; and Esophageal stricture on their problem list. His primarily concern today is the Foot Pain  Pain Assessment: Location: Right, Left Foot Radiating:   Onset: More than a month ago Duration: Chronic pain Quality: Aching, Numbness Severity: 7 /10 (self-reported pain score)  Note: Reported level is inconsistent with clinical  observations. Clinically the patient looks like a 3/10 A 3/10 is viewed as "Moderate" and described as significantly interfering with activities of daily living (ADL). It becomes difficult to feed, bathe, get dressed, get on and off the toilet or to perform personal hygiene functions. Difficult to get in and out of bed or a chair without assistance. Very distracting. With effort, it can be ignored when deeply involved in activities.       When using our objective Pain Scale, levels between 6 and 10/10 are said to belong in an emergency room, as it progressively worsens from a 6/10, described as severely limiting, requiring emergency care not usually available at an outpatient pain management facility. At a 6/10 level, communication becomes difficult and requires great effort. Assistance to reach the emergency department may be required. Facial flushing and profuse sweating along with potentially dangerous increases in heart rate and blood pressure will be evident. Effect on ADL:  limits ability to walk for extended period of time Timing: Constant Modifying factors: denies  [name] was last scheduled for an appointment on 06/21/2017 for medication management. During today's appointment we reviewed [name] chronic pain status, as well as his outpatient medication regimen.  The patient  reports that he does not use drugs. His body mass index is 27.57 kg/m.  Further details on both, my assessment(s), as well as the proposed treatment plan, please see below.  Controlled Substance Pharmacotherapy Assessment REMS (Risk Evaluation and Mitigation Strategy)  Analgesic: Hydrocodone 7.5 mg TID prn MME/day:22.5 mg/day.  [name] Shorter, RN  07/07/2017 12:34 PM  Signed Nursing Pain Medication Assessment:  Safety precautions to be maintained throughout the outpatient stay will include: orient to surroundings, keep bed in low position, maintain call bell within reach at all times, provide assistance with  transfer out of bed and ambulation.  Medication Inspection Compliance: Pill count conducted under aseptic conditions, in front of the  patient. Neither the pills nor the bottle was removed from the patient's sight at any time. Once count was completed pills were immediately returned to the patient in their original bottle.  Medication: Hydrocodone/APAP Pill/Patch Count: 36 of 90 pills remain Pill/Patch Appearance: Markings consistent with prescribed medication Bottle Appearance: Standard pharmacy container. Clearly labeled. Filled Date:03/ 19/ 2019 Last Medication intake:  Today   Pharmacokinetics: Liberation and absorption (onset of action): WNL Distribution (time to peak effect): WNL Metabolism and excretion (duration of action): WNL         Pharmacodynamics: Desired effects: Analgesia: [name] reports >50% benefit. Functional ability: Patient reports that medication does help, but not nearly as much as he would like Clinically meaningful improvement in function (CMIF): Sustained CMIF goals met Perceived effectiveness: Described as relatively effective but with some room for improvement Undesirable effects: Side-effects or Adverse reactions: None reported Monitoring: [place] PMP: Online review of the past 28-monthperiod conducted. Compliant with practice rules and regulations Last UDS on record: Summary  Date Value Ref Range Status  03/17/2017 FINAL  Final    Comment:    ==================================================================== TOXASSURE COMP DRUG ANALYSIS,UR ==================================================================== Test                             Result       Flag       Units Drug Absent but Declared for Prescription Verification   Tramadol                       Not Detected UNEXPECTED ng/mg creat   Salicylate                     Not Detected UNEXPECTED    Aspirin, as indicated in the declared medication list, is not    always detected even when used as  directed. ==================================================================== Test                      Result    Flag   Units      Ref Range   Creatinine              80               mg/dL      >=20 ==================================================================== Declared Medications:  The flagging and interpretation on this report are based on the  following declared medications.  Unexpected results may arise from  inaccuracies in the declared medications.  **Note: The testing scope of this panel includes these medications:  Tramadol  **Note: The testing scope of this panel does not include small to  moderate amounts of these reported medications:  Aspirin (Aspirin 81)  **Note: The testing scope of this panel does not include following  reported medications:  Carvedilol  Cephalexin  Clopidogrel  Furosemide  Metformin  Multivitamin  Mycophenolate mofetil (Cellcept)  Nitroglycerin  Ondansetron  Oxygen  Pantoprazole  Sodium Chloride  Sulfamethoxazole  Trimethoprim ==================================================================== For clinical consultation, please call ([phone] ====================================================================    UDS interpretation: Compliant          Medication Assessment Form: Reviewed. Patient indicates being compliant with therapy Treatment compliance: Compliant Risk Assessment Profile: Aberrant behavior: See prior evaluations. None observed or detected today Comorbid factors increasing risk of overdose: See prior notes. No additional risks detected today Risk of substance use disorder (SUD): Low Opioid Risk Tool - 06/21/17 1133  Family History of Substance Abuse   Alcohol  Negative    Illegal Drugs  Negative    Rx Drugs  Negative      Personal History of Substance Abuse   Alcohol  Negative    Illegal Drugs  Negative    Rx Drugs  Negative      Total Score   Opioid Risk Tool Scoring  0    Opioid Risk  Interpretation  Low Risk      ORT Scoring interpretation table:  Score <3 = Low Risk for SUD  Score between 4-7 = Moderate Risk for SUD  Score >8 = High Risk for Opioid Abuse   Risk Mitigation Strategies:  Patient Counseling: Covered Patient-Prescriber Agreement (PPA): Present and active  Notification to other healthcare providers: Done  Pharmacologic Plan: Increase Hydrocodone from 7.5 mg TID prn to 10 mg TID prn since patient obtaining some (but not significant) benefit with HC 7.5 mg TID prn             Laboratory Chemistry  Inflammation Markers (CRP: Acute Phase) (ESR: Chronic Phase) Lab Results  Component Value Date   ESRSEDRATE 38 (H) 11/22/2011                         Rheumatology Markers Lab Results  Component Value Date   RF 23 (H) 11/22/2011   ANA NEG 11/22/2011                        Renal Function Markers Lab Results  Component Value Date   BUN 30 (H) 06/24/2017   CREATININE 2.28 (H) 06/24/2017   GFRAA 30 (L) 06/24/2017   GFRNONAA 26 (L) 06/24/2017                              Hepatic Function Markers Lab Results  Component Value Date   AST 17 06/24/2017   ALT 10 06/24/2017   ALBUMIN 4.2 04/29/2017   ALKPHOS 67 04/29/2017                        Electrolytes Lab Results  Component Value Date   NA 142 06/24/2017   K 4.6 06/24/2017   CL 102 06/24/2017   CALCIUM 10.0 06/24/2017   MG 1.9 04/08/2017   PHOS 3.6 04/08/2016                        Neuropathy Markers Lab Results  Component Value Date   VITAMINB12 253 08/10/2016   FOLATE 17.6 08/10/2016   HGBA1C 8.0 (H) 04/18/2017                        Bone Pathology Markers No results found for: VD25OH, VD125OH2TOT, XL2440NU2, VO5366YQ0, 25OHVITD1, 25OHVITD2, 25OHVITD3, TESTOFREE, TESTOSTERONE                       Coagulation Parameters Lab Results  Component Value Date   INR 1.12 08/13/2015   LABPROT 14.6 08/13/2015   APTT 29 08/13/2015   PLT 201 06/24/2017                         Cardiovascular Markers Lab Results  Component Value Date   BNP 318.4 (H) 08/13/2015   CKTOTAL 73 12/31/2011   CKMB 3.4 12/31/2011  TROPONINI 0.68 (HH) 08/14/2015   HGB 12.0 (L) 06/24/2017   HCT 38.2 (L) 06/24/2017                         CA Markers No results found for: CEA, CA125, LABCA2                      Note: Lab results reviewed.  Recent Diagnostic Imaging Results  Cardiac event monitor SR isolated PAC;s and PVC;s  No significant arrhythmias  Complexity Note: Imaging results reviewed. Results shared with [name], using Layman's terms.                         Meds   Current Outpatient Medications:  .  aspirin EC 81 MG tablet, Take 81 mg by mouth at bedtime., Disp: , Rfl:  .  Blood Glucose Monitoring Suppl (ONE TOUCH ULTRA 2) w/Device KIT, Use to obtain blood sugar daily. Dx Code E11.40, Disp: 1 each, Rfl: 0 .  carvedilol (COREG) 3.125 MG tablet, Take 1 tablet (3.125 mg total) by mouth 2 (two) times daily with a meal., Disp: 180 tablet, Rfl: 3 .  cholecalciferol (VITAMIN D) 1000 units tablet, Take 2,000 Units by mouth daily. , Disp: , Rfl:  .  clopidogrel (PLAVIX) 75 MG tablet, TAKE 1 TABLET BY MOUTH DAILY WITH BREAKFAST., Disp: 30 tablet, Rfl: 10 .  DHA-Vitamin C-Lutein (EYE HEALTH FORMULA PO), Take 1 tablet by mouth daily. , Disp: , Rfl:  .  ferrous sulfate 324 (65 Fe) MG TBEC, Take 1 tablet by mouth daily., Disp: , Rfl:  .  furosemide (LASIX) 40 MG tablet, TAKE 1 TO 2 TABLETS BY MOUTH EVERY DAY, Disp: 60 tablet, Rfl: 11 .  glucose blood (ONE TOUCH ULTRA TEST) test strip, USE TO CHECK BLOOD SUGAR ONCE A DAY Dx Code E11.40, Disp: 100 each, Rfl: 3 .  metFORMIN (GLUCOPHAGE) 1000 MG tablet, TAKE 1 TABLET BY MOUTH ONCE DAILY WITH BREAKFAST, Disp: 30 tablet, Rfl: 11 .  Multiple Vitamin (MULTIVITAMIN WITH MINERALS) TABS tablet, Take 1 tablet by mouth daily., Disp: , Rfl:  .  nitroGLYCERIN (NITROSTAT) 0.4 MG SL tablet, DISSOLVE 1 TABLET UNDER THE TONGUE FOR CHEST PAIN.  MAY REPEAT EVERY 5MINUTES UP TO 3 DOSES. IF NO RELIEF, CALL 911**, Disp: 25 tablet, Rfl: 5 .  ondansetron (ZOFRAN) 4 MG tablet, TAKE ONE TABLET BY MOUTH EVERY 8 HOURS AS NEEDED FOR NAUSEA OR VOMITING, Disp: 60 tablet, Rfl: 1 .  ONETOUCH DELICA LANCETS 97X MISC, , Disp: , Rfl:  .  OXYGEN, Inhale into the lungs. Per pt- Uses Oxygen 2 liters at night., Disp: , Rfl:  .  pantoprazole (PROTONIX) 40 MG tablet, TAKE 1 TABLET BY MOUTH TWICE (2) DAILY, Disp: 60 tablet, Rfl: 11 .  AMITIZA 8 MCG capsule, , Disp: , Rfl:  .  amlodipine-atorvastatin (CADUET) 10-10 MG tablet, Take by mouth., Disp: , Rfl:  .  azaTHIOprine (IMURAN) 50 MG tablet, Take 50 mg by mouth., Disp: , Rfl:  .  HYDROcodone-acetaminophen (NORCO) 10-325 MG tablet, Take 1 tablet by mouth 3 (three) times daily as needed for severe pain. For chronic pain To fill on or after: 07/14/17, 08/12/17 Ok to fill 1 day early if pharmacy closed on fill date, Disp: 90 tablet, Rfl: 0 .  isosorbide dinitrate (ISORDIL) 30 MG tablet, Take 30 mg by mouth., Disp: , Rfl:  .  losartan (COZAAR) 100 MG tablet, Take 100  mg by mouth., Disp: , Rfl:  .  pregabalin (LYRICA) 100 MG capsule, Take 100 mg by mouth., Disp: , Rfl:   ROS  Constitutional: Denies any fever or chills Gastrointestinal: No reported hemesis, hematochezia, vomiting, or acute GI distress Musculoskeletal: Denies any acute onset joint swelling, redness, loss of ROM, or weakness Neurological: No reported episodes of acute onset apraxia, aphasia, dysarthria, agnosia, amnesia, paralysis, loss of coordination, or loss of consciousness  Allergies  [name] is allergic to doxazosin mesylate and methocarbamol.  [place]  Drug: [name]  reports that he does not use drugs. Alcohol:  reports that he does not drink alcohol. Tobacco:  reports that he quit smoking about 54 years ago. His smoking use included cigarettes. He has a 20.00 pack-year smoking history. He has never used smokeless tobacco. Medical:  has  a past medical history of Arthritis, CAD (coronary artery disease), Chronic diastolic CHF (congestive heart failure) ([place]), Chronic kidney disease, stage III (moderate) ([place]), Chronic lower back pain, Colon polyps, Diverticulosis, Dyspnea (2009 since July -Sept), Enlarged prostate, Esophageal stricture, GERD (gastroesophageal reflux disease), Heart murmur, Hiatal hernia, History of kidney stones, History of PFTs, Hyperlipidemia, Hypertension, Interstitial lung disease ([place]), Nausea & vomiting, Overweight (BMI 25.0-29.9), RA (rheumatoid arthritis) ([place]), Seropositive rheumatoid arthritis ([place]), and Type II diabetes mellitus ([place]). Surgical: [name]  has a past surgical history that includes Total knee arthroplasty (Right, 03/2010); Knee arthroscopy (Right, 2008); Cataract extraction w/ intraocular lens  implant, bilateral (Bilateral); Coronary artery bypass graft (11/1999); Coronary stent placement (02/2012); Shoulder arthroscopy with open rotator cuff repair and distal clavicle acrominectomy (Left, 02/27/2013); Trigger finger release (Left, 02/27/2013); Cholecystectomy open (11/2003); Joint replacement; Esophagogastroduodenoscopy (egd) with esophageal dilation (2010); Cardiac catheterization (08/2004); Cardiac catheterization (12/31/2011); Coronary angioplasty with stent (01/03/2012); Coronary angioplasty with stent (01/14/2014); Cardiac catheterization (2009); left and right heart catheterization with coronary angiogram (12/31/2011); percutaneous coronary stent intervention (pci-s) (12/31/2011); percutaneous coronary stent intervention (pci-s) (N/A, 01/03/2012); Percutaneous coronary intervention-balloon only (01/03/2012); left and right heart catheterization with coronary angiogram (N/A, 01/14/2014); Hand surgery; Cardiac catheterization (N/A, 08/13/2015); Esophagogastroduodenoscopy (N/A, 03/01/2017); and maloney dilation (03/01/2017). Family: family history includes Alcohol abuse in his sister; COPD in his mother;  Colon cancer (age of onset: 52) in his brother; Diabetes in his brother; Heart attack in his father; Heart disease in his father; Stroke in his sister.  Constitutional Exam  General appearance: Well nourished, well developed, and well hydrated. In no apparent acute distress Vitals:   07/07/17 1224  BP: (!) 185/82  Pulse: 66  Resp: 16  Temp: (!) 97.5 F (36.4 C)  SpO2: 94%  Weight: 209 lb (94.8 kg)  Height: '6\' 1"'$  (1.854 m)   BMI Assessment: Estimated body mass index is 27.57 kg/m as calculated from the following:   Height as of this encounter: '6\' 1"'$  (1.854 m).   Weight as of this encounter: 209 lb (94.8 kg).  BMI interpretation table: BMI level Category Range association with higher incidence of chronic pain  <18 kg/m2 Underweight   18.5-24.9 kg/m2 Ideal body weight   25-29.9 kg/m2 Overweight Increased incidence by 20%  30-34.9 kg/m2 Obese (Class I) Increased incidence by 68%  35-39.9 kg/m2 Severe obesity (Class II) Increased incidence by 136%  >40 kg/m2 Extreme obesity (Class III) Increased incidence by 254%   BMI Readings from Last 4 Encounters:  07/07/17 27.57 kg/m  06/24/17 27.71 kg/m  06/21/17 27.31 kg/m  05/24/17 27.31 kg/m   Wt Readings from Last 4 Encounters:  07/07/17 209 lb (94.8 kg)  06/24/17 210 lb (95.3 kg)  06/21/17 207 lb (93.9 kg)  05/24/17 207 lb (93.9 kg)  Psych/Mental status: Alert, oriented x 3 (person, place, & time)       Eyes: PERLA Respiratory: No evidence of acute respiratory distress  Cervical Spine Area Exam  Skin & Axial Inspection: No masses, redness, edema, swelling, or associated skin lesions Alignment: Symmetrical Functional ROM: Unrestricted ROM      Stability: No instability detected Muscle Tone/Strength: Functionally intact. No obvious neuro-muscular anomalies detected. Sensory (Neurological): Unimpaired Palpation: No palpable anomalies              Upper Extremity (UE) Exam    Side: Right upper extremity  Side: Left upper  extremity  Skin & Extremity Inspection: Skin color, temperature, and hair growth are WNL. No peripheral edema or cyanosis. No masses, redness, swelling, asymmetry, or associated skin lesions. No contractures.  Skin & Extremity Inspection: Skin color, temperature, and hair growth are WNL. No peripheral edema or cyanosis. No masses, redness, swelling, asymmetry, or associated skin lesions. No contractures.  Functional ROM: Unrestricted ROM          Functional ROM: Unrestricted ROM          Muscle Tone/Strength: Functionally intact. No obvious neuro-muscular anomalies detected.  Muscle Tone/Strength: Functionally intact. No obvious neuro-muscular anomalies detected.  Sensory (Neurological): Unimpaired          Sensory (Neurological): Unimpaired          Palpation: No palpable anomalies              Palpation: No palpable anomalies              Specialized Test(s): Deferred         Specialized Test(s): Deferred          Thoracic Spine Area Exam  Skin & Axial Inspection: No masses, redness, or swelling Alignment: Symmetrical Functional ROM: Unrestricted ROM Stability: No instability detected Muscle Tone/Strength: Functionally intact. No obvious neuro-muscular anomalies detected. Sensory (Neurological): Unimpaired Muscle strength & Tone: No palpable anomalies  Lumbar Spine Area Exam  Skin & Axial Inspection: No masses, redness, or swelling Alignment: Symmetrical Functional ROM: Unrestricted ROM      Stability: No instability detected Muscle Tone/Strength: Functionally intact. No obvious neuro-muscular anomalies detected. Sensory (Neurological): Unimpaired Palpation: No palpable anomalies       Provocative Tests: Lumbar Hyperextension and rotation test: evaluation deferred today       Lumbar Lateral bending test: evaluation deferred today       Patrick's Maneuver: evaluation deferred today                    Gait & Posture Assessment  Ambulation: Unassisted Gait: Relatively normal for age  and body habitus Posture: WNL   Lower Extremity Exam    Side: Right lower extremity  Side: Left lower extremity  Skin & Extremity Inspection: Skin color, temperature, and hair growth are WNL. No peripheral edema or cyanosis. No masses, redness, swelling, asymmetry, or associated skin lesions. No contractures.  Skin & Extremity Inspection: Skin color, temperature, and hair growth are WNL. No peripheral edema or cyanosis. No masses, redness, swelling, asymmetry, or associated skin lesions. No contractures.  Functional ROM: Unrestricted ROM          Functional ROM: Unrestricted ROM          Muscle Tone/Strength: Functionally intact. No obvious neuro-muscular anomalies detected.  Muscle Tone/Strength: Functionally intact. No obvious neuro-muscular anomalies detected.  Sensory (Neurological): Paresthesia (  Burning sensation)  Sensory (Neurological): Paresthesia (Burning sensation)  Palpation: No palpable anomalies  Palpation: No palpable anomalies   Assessment  Primary Diagnosis & Pertinent Problem List: The primary encounter diagnosis was Diabetic polyneuropathy associated with type 2 diabetes mellitus ([employer]). Diagnoses of Neuropathy, Seropositive rheumatoid arthritis ([place]), and CKD stage 3 due to type 2 diabetes mellitus ([place]) were also pertinent to this visit.  Status Diagnosis  Responding Responding Controlled 1. Diabetic polyneuropathy associated with type 2 diabetes mellitus ([place])   2. Neuropathy   3. Seropositive rheumatoid arthritis ([place])   4. CKD stage 3 due to type 2 diabetes mellitus ([place])      General Recommendations: The pain condition that the patient suffers from is best treated with a multidisciplinary approach that involves an increase in physical activity to prevent de-conditioning and worsening of the pain cycle, as well as psychological counseling (formal and/or informal) to address the co-morbid psychological affects of pain. Treatment will often involve judicious use of pain  medications and interventional procedures to decrease the pain, allowing the patient to participate in the physical activity that will ultimately produce long-lasting pain reductions. The goal of the multidisciplinary approach is to return the patient to a higher level of overall function and to restore their ability to perform activities of daily living.  84 year old male with a history of coronary artery disease, GERD, interstitial lung disease, rheumatoid arthritis, aortic stenosis, chronic kidney disease stage III who presents with lower extremity neuropathic pain secondary to diabetic polyneuropathy. Patient's diagnosis of diabetes was over 10-15 years ago. He is not on insulin. He describes burning and tingling sensation in his toes. This is worsened over time. This is progressed to the dorsum of his foot. Patient has been tried on various medications including desipramine, lidocaine cream, gabapentin at a dose of 900 mill grams in the morning, 600 mg in the afternoon, 600 mg at bedtime. This dose had to be reduced secondary to his chronic kidney disease. Patient was also tried on Lyrica 100 mill grams twice daily but it was too expensive for the patient to afford. Furthermore Lyrica resulted in leg swelling and further dose escalation was limited by patient's chronic kidney disease stage III. It was not very effective for his neuropathic pain symptoms. Patient also has tried carbamazepine which was not effective for his neuropathic pain. Patient has also had injections in his foot for his pain symptoms which were not effective.   Today, we will increase Hydrocodone from 7.5 mg TID prn to 10 mg TID prn since patient obtaining some (but not significant) benefit with HC 7.5 mg TID prn  Plan of Care  Pharmacotherapy (Medications Ordered): Meds ordered this encounter  Medications  . DISCONTD: HYDROcodone-acetaminophen (NORCO) 10-325 MG tablet    Sig: Take 1 tablet by mouth 3 (three) times  daily as needed for severe pain. For chronic pain To fill on or after: 07/14/17, 08/12/17 Ok to fill 1 day early if pharmacy closed on fill date    Dispense:  90 tablet    Refill:  0    Do not place this medication, or any other prescription from our practice, on "Automatic Refill". Patient may have prescription filled one day early if pharmacy is closed on scheduled refill date. Do not fill until:  To last until:  . HYDROcodone-acetaminophen (NORCO) 10-325 MG tablet    Sig: Take 1 tablet by mouth 3 (three) times daily as needed for severe pain. For chronic pain To fill on or after: 07/14/17, 08/12/17  Ok to fill 1 day early if pharmacy closed on fill date    Dispense:  90 tablet    Refill:  0    Do not place this medication, or any other prescription from our practice, on "Automatic Refill". Patient may have prescription filled one day early if pharmacy is closed on scheduled refill date. Do not fill until:  To last until:     Provider-requested follow-up: Return in about 8 weeks (around 09/01/2017) for Medication Management. Time Note: Greater than 50% of the 25 minute(s) of face-to-face time spent with [name], was spent in counseling/coordination of care regarding: [name] primary cause of pain, the treatment plan, medication side effects, the opioid analgesic risks and possible complications, the appropriate use of his medications, the medication agreement and the patient's responsibilities when it comes to controlled substances. Future Appointments  Date Time Provider [place]  08/01/2017 10:30 AM [facility] 6 MINUTE WALK [facility] None  08/01/2017 11:00 AM Brand Males, MD [facility] None  09/01/2017 10:40 AM Ofilia Neas, PA-C [facility] None  09/01/2017  1:45 PM Gillis Santa, MD [facility] None  10/17/2017 11:00 AM [name], MD [facility]  01/10/2018 11:30 AM [facility] 2 [id number]    Primary Care Physician: [name],  MD Location: [facility] Note by: Gillis Santa, M.D Date: 07/07/2017; Time: 2:23 PM  Patient Instructions  You have been given 2 scripts for hydrocodone today.  They will last you until 09-12-17

## 2017-07-07 NOTE — Progress Notes (Signed)
Nursing Pain Medication Assessment:  Safety precautions to be maintained throughout the outpatient stay will include: orient to surroundings, keep bed in low position, maintain call bell within reach at all times, provide assistance with transfer out of bed and ambulation.  Medication Inspection Compliance: Pill count conducted under aseptic conditions, in front of the patient. Neither the pills nor the bottle was removed from the patient's sight at any time. Once count was completed pills were immediately returned to the patient in their original bottle.  Medication: Hydrocodone/APAP Pill/Patch Count: 36 of 90 pills remain Pill/Patch Appearance: Markings consistent with prescribed medication Bottle Appearance: Standard pharmacy container. Clearly labeled. Filled Date:03/ 19/ 2019 Last Medication intake:  Today

## 2017-07-13 ENCOUNTER — Telehealth: Payer: Self-pay | Admitting: Internal Medicine

## 2017-07-13 NOTE — Telephone Encounter (Signed)
See request °

## 2017-07-13 NOTE — Telephone Encounter (Signed)
Copied from [place] [phone]. Topic: Quick Communication - Rx Refill/Question >> Jul 13, 2017  1:46 PM [name] wrote: Medication: Blood Glucose Monitoring Suppl (ONE TOUCH ULTRA 2) w/Device KIT (the pharmacist states that the machine broke and that insurance will pay for device)  ONETOUCH DELICA LANCETS 86L MISC      glucose blood (ONE TOUCH ULTRA TEST) test strip Has the patient contacted their pharmacy? Yes about the device but [name] the pharmacist from [place] called (Agent: If no, request that the patient contact the pharmacy for the refill.) Preferred Pharmacy (with phone number or street name):     [facility] [place] [facility] [place] [phone] (Phone) [phone] (Fax)     Agent: Please be advised that RX refills may take up to 3 business days. We ask that you follow-up with your pharmacy.

## 2017-07-16 DIAGNOSIS — R0689 Other abnormalities of breathing: Secondary | ICD-10-CM | POA: Diagnosis not present

## 2017-07-16 DIAGNOSIS — J8410 Pulmonary fibrosis, unspecified: Secondary | ICD-10-CM | POA: Diagnosis not present

## 2017-07-18 MED ORDER — GLUCOSE BLOOD VI STRP: ORAL_STRIP | 3 refills | Status: DC

## 2017-07-18 MED ORDER — ONETOUCH DELICA LANCETS 33G MISC: 1.0000 | Freq: Every day | 3 refills | Status: DC

## 2017-07-18 MED ORDER — ONETOUCH ULTRA 2 W/DEVICE KIT: PACK | 0 refills | Status: DC

## 2017-07-18 NOTE — Telephone Encounter (Signed)
Rxs sent electronically.  

## 2017-07-25 ENCOUNTER — Other Ambulatory Visit: Payer: Self-pay | Admitting: Internal Medicine

## 2017-07-26 ENCOUNTER — Telehealth: Payer: Self-pay | Admitting: Cardiovascular Disease

## 2017-07-26 DIAGNOSIS — H524 Presbyopia: Secondary | ICD-10-CM | POA: Diagnosis not present

## 2017-07-26 DIAGNOSIS — Z961 Presence of intraocular lens: Secondary | ICD-10-CM | POA: Diagnosis not present

## 2017-07-26 DIAGNOSIS — H353213 Exudative age-related macular degeneration, right eye, with inactive scar: Secondary | ICD-10-CM | POA: Diagnosis not present

## 2017-07-26 DIAGNOSIS — H353121 Nonexudative age-related macular degeneration, left eye, early dry stage: Secondary | ICD-10-CM | POA: Diagnosis not present

## 2017-07-26 DIAGNOSIS — E119 Type 2 diabetes mellitus without complications: Secondary | ICD-10-CM | POA: Diagnosis not present

## 2017-07-26 LAB — HM DIABETES EYE EXAM

## 2017-07-26 NOTE — Telephone Encounter (Signed)
Dr. Johnsie Cancel, can this pt hold Plavix for 7 days prior to colonoscopy/upper endoscopy?  Please route response back to [facility] Preop

## 2017-07-26 NOTE — Telephone Encounter (Signed)
° °  [facility] Pre-operative Risk Assessment    Request for surgical clearance:  1. What type of surgery is being performed? Colonoscopy/Upper Endoscopy   2. When is this surgery scheduled? 08/03/17   3. What type of clearance is required (medical clearance vs. Pharmacy clearance to hold med vs. Both)? Hold med  4. Are there any medications that need to be held prior to surgery and how long?If pt can hold Plavix 7 days    5. Practice name and name of physician performing surgery? [facility]..Dr Domenica Fail   6. What is your office phone number [phone]    7.   What is your office fax number [phone]  8.   Anesthesia type (None, local, MAC, general) ? propofol   [name] 07/26/2017, 1:24 PM  _________________________________________________________________   (provider comments below)

## 2017-07-28 NOTE — Telephone Encounter (Signed)
Discussed with staff.  No recent stent.  Okay to hold Plavix to allow colonoscopy.  Resume as soon as possible after procedure.

## 2017-07-28 NOTE — Telephone Encounter (Signed)
   Primary Cardiologist: [name], MD  Chart reviewed as part of pre-operative protocol coverage. H/o CAD s/p CABG, PCI, ILD, AS, CKD, RA, HTN, HLD, DM, GERD, fatigue, PACs, PVCs, chronic diastolic CHF. Last echo 01/2017 showed EF 60-65%, mild-mod AS, mild AI. Last OV 05/20/17 with Dr. Johnsie Cancel at which time no med changes were made; patient felt to be stable per note with acknowledgement of planned colonoscopy in May. Dr. Johnsie Cancel did not appear to have any reservations noted in his note. RCRI calculated at >11%. Called patient to review how he is feeling per protocol - since being treated for low iron, he actually feels significantly better with less SOB and fatigue. He's not had any chest pain. Given past medical history and time since last visit, based on ACC/AHA guidelines, [name] would be at acceptable risk for the planned procedure without further cardiovascular testing. He understands no procedure is without risk especially given complex PMH.  Dr. Johnsie Cancel is out of the office but Dr. Tamala Julian reviewed chart to weigh in on appropriateness of holding Plavix. This was requested for 7 days per original request. Per Dr. Thompson Caul reply, "Discussed with staff.  No recent stent.  Okay to hold Plavix to allow colonoscopy.  Resume as soon as possible after procedure." Dr. Thompson Caul covering [name] informed me that she had a conversation with both the GI office and Dr. Tamala Julian and both would be OK with holding for 5 days if that is instead deemed ideal by GI.  As the patient has continued on his Plavix and took his dose today, I asked him to contact the GI office to discuss timing of procedure. I will route this recommendation to the requesting party via Epic fax function and remove from pre-op pool.  Please call with questions.  Charlie Pitter, PA-C 07/28/2017, 2:00 PM

## 2017-08-01 ENCOUNTER — Ambulatory Visit (INDEPENDENT_AMBULATORY_CARE_PROVIDER_SITE_OTHER): Payer: Medicare Other | Admitting: Internal Medicine

## 2017-08-01 ENCOUNTER — Telehealth: Payer: Self-pay | Admitting: Internal Medicine

## 2017-08-01 ENCOUNTER — Ambulatory Visit: Payer: Medicare Other

## 2017-08-01 ENCOUNTER — Encounter: Payer: Self-pay | Admitting: Internal Medicine

## 2017-08-01 VITALS — BP 128/70 | HR 70 | Ht 73.0 in | Wt 208.0 lb

## 2017-08-01 DIAGNOSIS — R0600 Dyspnea, unspecified: Secondary | ICD-10-CM | POA: Diagnosis not present

## 2017-08-01 DIAGNOSIS — J849 Interstitial pulmonary disease, unspecified: Secondary | ICD-10-CM | POA: Diagnosis not present

## 2017-08-01 NOTE — Patient Instructions (Signed)
ICD-10-CM   1. ILD (interstitial lung disease) ([place]) J84.9   2. Dyspnea, unspecified type R06.00     ILD stable Glad shortness of breath some better with anemia improvement via iron tablets  Plan Continue anemia workup with colonoscopy Ok to go back on immuran when you see Dr Bronson Curb next   - no rush as long as next few months is fine  - I want the anemia workup completed before immuran restart  Followup 6 months or sooner if needed

## 2017-08-01 NOTE — Progress Notes (Signed)
Subjective:     Patient ID: [name], male   DOB: November 16, 1931, 84 y.o.   MRN: [id number]  HPI  84 yo male former smoker with RA-ILD   TEST     - Coronary artery disease. s/p CABG x 5 in 2002.  Dyspnea started following lopressor Jan-July 2009 and lisinopril increae July 2009. No relief despite stopping agents July 2010  -   Myoview on April 17, 2007 was  nonischemic with an EF of 51% and inferobasal wall scar.   - CAth Oct 2009:    - . Severe native three-vessel coronary artery disease. 2. Status post multivessel coronary bypass surgery with all grafts  patent. Normal LVEF and LVEDP - PFT - Mixed pattern on spiro. Mild restn on lung volumeswith near normal DLCO.   - Fev1 2.2L/73%, FVC 3.21L/70%, Ratio 68 (67),  TLC 4.7L/68%, RV 1.5L/55%, DLCO 79%.  -CPST 05/06/2008: Normal effot.    - Reduced VO2 max 20.5/65%, REduced AT 8.2/40%, Normal breathing reservce of 55%, submaximal heart rate response 112/77%,  flattened O2 pulse response at peak exercise - 57m/beat at 85%. No VQ mismatch abnormalities. - ECHO 09/04/2008: LVH, ef 60%, mild AS and unchanged from prior   - CT chest   - Non specific Interstitial Lung disease NOS. Stable pulm infilratates RLL ggo > LLL ggo. 03/2008 -> 10/2008 -> June 2011: stable - Rehab: never attended 2009-2013 due to cost co.  2013 Walking desat test : resting 98/94% -> 3 laps x 185 feet; HR 113/pulse ox 96%    PFTs July 2013 show worsening restriction since 2010 along with reduced diffusion. The RLL baseline GGO might be worse in July 2013 compared to 2011. In addition, autommune profile shows VERY HIGH TITERS of CCP antibody > 300 which is pathognomonic of rheumtoid arthritis lung involvement. RF is only borderline elevated a 23  PFT 10/12/11>>FEV1 2.02 (70%), Fvc 2.8/72%, TLC 4.07 (59%), DLCO 62%, no BD: RESTRICTION - WORSE since 2010 -(2010:  Fev1 2.2L/73%, FVC 3.21L/70%, Ratio 68 (67),  TLC 4.7L/68%, RV 1.5L/55%, DLCO 79%)   Echo 05/04/11>>mild LVH, EF  55 to 633% grade 1 diastolic dysfx, mild AS, mild MR, PAS 34 mmHg  CT 11/02/11 - calcification suspicious for aortic valve and some GGO esp RLL > LLL (? RLL worse compared to 2011)   [place]: dLM 80%, LAD 100%, circumflex 100%, RCA 100%, proximal SVG-OM 30%, SVG-D1 ok, SVG-RCA 99% distal and 80% ostial PDA distal to graft, LIMA-LAD ok with some left to right collaterals.  PCI 01/03/12: Promus DES to the SVG-RCA and Cutting Balloon angioplasty to the ostial PDA.  Echocardiogram 12/31/11: EF 529-51% grade 1 diastolic dysfunction, mild aortic stenosis, mean gradient 9, mild LAE.   Walking desaturation test on 05/05/2012 185 feet x 3 laps:  did NOT desaturate. Rest pulse ox was 98%, final pulse ox was 96%. HR response 69/min at rest to 82/min at peak exertion.   PFT FVC fev1 ratio BD fev1 TLC DLCO comment intervention  2010 3.2L/70% 2.2L/73%   4.7L/68% 79%    10/12/11 2.8L/72% 2.02L/70% 72  4.07L/59% 62% Worsening restriction since 2010 Rheum will start immuran/pred. Also had PCI in nov 2013  04/28/12 2.6L/59% 1.77L/62% 67  4.0L/59% 15.7/71%     '2015 PFT showed no significant change with FEV1 56% , ratio 66,  FVC 61%.   07/31/2015 Follow up : ILD ? RA related  Pt returns for follow up . Has been have more DOE.  Was set up for a  CT chest  PFT on 4/25 showed FEV1 57%, ratio 71, FVC 58%, no sign BD response , + BD in midflows.  DLCO 40%. (this is decreased from 2015- was 59%.  TLC is similar to 2015 . Has been started on Imuran for RA ILD .  CT chest did not show any sgin change , cont Bilateral LL GGO and bronchiectasis .  Remains on O2 2l/m  At bedtime  . ONO showed desats on RA , O2 was continued.  Has been [facility] by [facility] last year. Now on Imuran, feels Arthritis pain is better.  Says he is feels he is doing okay, gets winded with walking long distances.  Can walk 1/2 block before stopping.  Has dry cough some days.  PVX and Prevnar 13 are utd.   Followed by Dr. Estil Daft in Rheum. Remains on  Imuran. Most pain is in his hands.   Denies chest pain, orthopnea , edema or fever.    OV 12/09/2015   Chief Complaint  Patient presents with  . Follow-up    Feels about the same since the last time we saw him, cough, with some mucus    Follow-up refractory dyspnea in the setting of mild interstitial lung disease from rheumatoid arthritis and coronary artery disease and multifactorial reasons. Refractory to pulmonary rehabilitation.  He did see my nurse practitioner April 2017 and CT chest did not show any changes to his bilateral lower lobe groundglass opacities and bronchiectasis. He is being maintained on Imuran for his rheumatoid arthritis. He is on oxygen 2 L at bedtime.     Had echocardiogram May 2017 that shows elevated pulmonary artery systolic pressure, mild aortic stenosis and diastolic dysfunction. On 08/13/2015 he underwent left heart catheterization by Dr. Sherren Mocha that shows severe native three-vessel coronary artery disease status post bypass with continued patency of the LIMA to LAD and continued patency of the saphenous vein graft to OM with severe stenosis in the proximal body of the graft for which he had successful PCI of a drug-eluting stent treatment of in-stent restenosis. Recommendation is dual antiplatelet therapy lifelong.   He is with his wife currently. He says dyspnea is only some better. He still has significant dyspnea and faitgues more than his wife.   OV 06/07/2016  Chief Complaint  Patient presents with  . Follow-up    Pt feels like his breathing has not been doing good, pt c/o coughing more with greyish colored mucus, more increase sob with exertion, has some chest tightnes, does has some congestion Denies fever   Follow-up refractory dyspnea in the setting of rheumatoid arthritis andand coronary artery disease. Multifactorial dyspnea. On Imuran for rheumatoid arthritis oxygen 2 L at bedtime. He is refractory to pulmonary rehabilitation. He is  believed to have some interstitial lung disease but April 2017 CT chest suggested only mild bronchiectasis  Last seen in September 2017 and at that time as dyspnea with some better after having a cardiac stent. I discussed with his cardiologist about the possibility of having right heart catheterization but it was felt that the elevation in pulmonary artery pressure was not significant enough on the echocardiogram to warrant this. He tells me that he is deteriorated now and this shortness of breath is even worse. He has tried Symbicort in the past without much help. He is willing to try "anything" to help his dyspnea. Walking desaturation test on 06/07/2016 185 feet x 3 laps on RA:  did NOT desaturate. Rest pulse ox was 97%, final  pulse ox was 95%. Did only 2 laps and stopped in interim x 2. . HR response 64/min at rest to 70/min at peak exertion. He says he stopped due to dyspnea and chest pain - like always       OV 08/03/2016  Chief Complaint  Patient presents with  . Follow-up    Pt here after HRCT. Pt states he tried the trelegy and states it helped his breathing. Pt denies cough, CP/tightness, and f/c/s.     Refractory dyspnea in this gentleman with cardiac disease and also mixed obstructive restrictive spirometry due to air trapping and bronchiectasis and some groundglass.  Last visit I tried him on a sample of triple inhaler therapy. He says that this only worked temporarily and stop working. Nevertheless he wants another inhaler. He is frustrated by his dyspnea. He and his wife are making for relief from dyspnea. In 2016 he visited [place] and they were considering switching his Imuran to CellCept in order to improve his pulmonary parenchymal problems. He is open to that. However he is not sure he needs follow-up with [facility] because Dr Deeann Dowse left the practice there. The suggestion of mild elevation in pulmonary artery systolic pressures and a previous echocardiogram but it  was not significant enough to warrant cardiology to do right heart catheterization. He is willing to have right heart catheterization if recommended. Overall frustrated. Pulm function test does not show any change in 2 years. And CT scan itself in 2018 appears to be stable   Results for [name], [name] (MRN [id number]) as of 08/03/2016 12:17  Ref. Range 09/18/2013 11:28 07/29/2015 11:12  FEV1-Pre Latest Units: L 1.69 1.67  FEV1-%Pred-Pre Latest Units: % 56 52  Pre FEV1/FVC ratio Latest Units: % 66 66  Results for [name], [name] (MRN [id number]) as of 08/03/2016 12:17  Ref. Range 09/18/2013 11:28 07/29/2015 11:12  TLC Latest Units: L 4.70 4.81  TLC % pred Latest Units: % 64 62  Results for [name], [name] (MRN [id number]) as of 08/03/2016 12:17  Ref. Range 09/18/2013 11:28 07/29/2015 11:12  DLCO unc Latest Units: ml/min/mmHg 19.91 14.87  DLCO unc % pred Latest Units: % 59 40   IMPRESSION: 1. Stable mild-to-moderate cylindrical and varicoid bronchiectasis in the bilateral dependent lower lobes. 2. Continued stability of parenchymal banding with associated subpleural reticulation and ground-glass attenuation at the areas of bronchiectasis in the dependent and basilar lower lobes, most consistent with postinfectious/postinflammatory scarring. No findings to suggest interstitial lung disease. 3. Stable smooth pleural thickening and calcification in the dependent basilar right pleural space, most compatible with chronic postinflammatory change. No pleural effusions. 4. Stable mild patchy air trapping in both lungs, suggesting small airways disease. 5. Aortic atherosclerosis. Left main and 3 vessel coronary atherosclerosis status post CABG.    OV 01/31/2017  Chief Complaint  Patient presents with  . Follow-up    Pt states that he has been sick x5 months with nausea and vomiting. C/o SOB with exertion, prod. cough with gray mucus, and occ. CP when SOB.    84 year old male with rheumatoid  arthritis and interstitial lung disease. He has refractory dyspnea class III on exertion relieved by rest. He also has associated exertional chest pain.at last visit I had his rheumatologist Dr. D evaluate him in switching his Imuran to CellCeptwhich she did in 11/18/2016. He is on low-dose methotrexate was reduced GFR. He is tolerating the medication well. He complains of nausea and vomiting but this preceded the CellCept and Bactrim  by 2 months.he is scheduled for an endoscopy in November 2018. He does not change because of the CellCept and Bactrim.Most recent lab 12/26/2016 fairly stable except mild reduction in hemoglobin. Dr. Keturah Barre has referred him back to primary care physician.he continues to be overwhelmed by his dyspnea and also associated chest pain with exertion. Was recently saw cardiology and had a stable echocardiogram and EKG according to his history. Cardiologist reassured him in his view of the chest pain is not associated with cardiac disease. He therefore is insisting to me that this is because of lung disease. Walking desaturation test 185 feet 3 laps on room air with a forehead probe: Resting pulse ox 99%. Final pulse ox 98%. Resting heart rate 80/m. Final heart rate 98 a minute. He did get dyspneic with chest pain as always. This no change in his oxygen desaturation test.\  The only changes on the last 3 weeks is complaining of increased cough congestion in his chestand a grayish sputum that is different from from baseline.   OV 04/29/2017  Chief Complaint  Patient presents with  . Follow-up    Pt states his SOB has become worse and coughing with gray mucus. Pt states abx he was placed on last visit did not clear up symptoms. Cellcept and bactrim were both stopped Tuesday, 04/26/17 pt believes.   Follow-up multifactorial refractory dyspnea in the setting of interstitial lung disease related to rheumatoid arthritis  He has been on CellCept for a while associated with Bactrim.  Earlier  this week he saw a rheumatologist Dr. Keturah Barre.  He complained of significant nausea.  She called me we spoke.  Because of lack of improvement with dyspnea with the CellCept regimen recommended by [facility].  We decided to discontinue this.  At the same time was having nausea and there was concern this was related to CellCept/Bactrim.  Today he tells me that since stopping CellCept/Bactrim nausea and vomiting have significantly improved.  He still feels fatigued.  His cardiologist now has it on a monitor because of his refractory dyspnea.  He is frustrated by his dyspnea which he says is exertional and associated with central chest pain.  There are no other new issues.  Recent review of the labs show that he has been anemic early in January 2019 and his chronic kidney disease is getting worse.  The status of these as of today is not known.  Next blood work is only in a month from now/   Walking desaturation test on 04/29/2017 185 feet x 3 laps on ROOM AIR:  did NOT desaturate. Rest pulse ox was 98%, final pulse ox was 95%. HR response 69/min at rest to 94/min at peak exertion. Patient [name]  Did not Desaturate < 88% . [name] yes did  Desaturated </= 3% points. [name] yes did get tachyardic. DID get dyspneic with central chest pain - just like before   OV 08/01/2017    Chief Complaint  Patient presents with  . Follow-up    Pt states he is the same as he was at the last visit, maybe some better. Pt still becomes SOB with exertion, some mild coughing with gray mucus, and occ. chest tightness.   Follow-up multifactorial refractory dyspnea in the setting of interstitial lung disease related to rheumatoid arthritis   84 year old male with rheumatoid arthritis interstitial lung disease and also severe coronary artery disease on medical management.  He is here for follow-up.  There is a 53-month  follow-up.  In the interim his anemia has improved after he saw doctors at [facility] and placed on iron tablets.  His exertional chest pain is somewhat better and his dyspnea somewhat better but he still does have baseline exertional dyspnea and chest pain that is refractory to any treatment.  He is no longer on CellCept or Bactrim for his interstitial lung disease after developing nausea.  He has been off immune modulator treatment for 3 months.  He did see Dr. D rheumatologist end of March 2019 and she was wondering about restarting his old Imuran.  At this point in time he is still going through anemia work-up.  He has a colonoscopy upcoming.  He will have Plavix on hold prior to the colonoscopy.  At this point in time his walking desaturation test is stable and documented below.  There are no other new issues.  Walking desaturation test on 08/01/2017 185 feet x 3 laps on ROOM AIR:  did not desaturate. Rest pulse ox was 98%, final pulse ox was 98%. HR response 67/min at rest to 86/min at peak exertion. Patient [name]  Did not Desaturate < 88% . [name] did not  Desaturated </= 3% points. [name] did not get tachyardic.  DID get dyspneic with central chest pain - just like before   Results for QUNICY, HIGINBOTHAM (MRN [id number]) as of 08/01/2017 11:25  Ref. Range 06/24/2017 11:36  Creatinine Latest Ref Range: 0.70 - 1.11 mg/dL 2.28 (H)    Results for JOHNATHIN, VANDERSCHAAF (MRN [id number]) as of 08/01/2017 11:25  Ref. Range 06/24/2017 11:36  WBC Latest Ref Range: 3.8 - 10.8 Thousand/uL 7.8     has a past medical history of Arthritis, CAD (coronary artery disease), Chronic diastolic CHF (congestive heart failure) ([place]), Chronic kidney disease, stage III (moderate) ([place]), Chronic lower back pain, Colon polyps, Diverticulosis, Dyspnea (2009 since July -Sept), Enlarged prostate, Esophageal stricture, GERD (gastroesophageal reflux disease), Heart murmur, Hiatal hernia, History of kidney stones, History of PFTs, Hyperlipidemia, Hypertension, Interstitial lung disease ([place]),  Nausea & vomiting, Overweight (BMI 25.0-29.9), RA (rheumatoid arthritis) ([place]), Seropositive rheumatoid arthritis ([place]), and Type II diabetes mellitus ([place]).   reports that he quit smoking about 54 years ago. His smoking use included cigarettes. He has a 20.00 pack-year smoking history. He has never used smokeless tobacco.  Past Surgical History:  Procedure Laterality Date  . CARDIAC CATHETERIZATION  08/2004   CP- no MI, Cath- small vessell disease   . CARDIAC CATHETERIZATION  12/31/2011   80% distal LM, 100% native LAD, LCx and RCA, 30% prox SVG-OM, SVG-D1 normal, 99% distal, 80% ostial SVG-RCA distal to graft, LIMA-LAD normal; LVEF mildly decreased with posterior basal AK   . CARDIAC CATHETERIZATION  2009   with patent grafts/notes 12/31/2011  . CARDIAC CATHETERIZATION N/A 08/13/2015   Procedure: Left Heart Cath and Cors/Grafts Angiography;  Surgeon: Sherren Mocha, MD;  Location: [place] [facility];  Service: Cardiovascular;  Laterality: N/A;  . CATARACT EXTRACTION W/ INTRAOCULAR LENS  IMPLANT, BILATERAL Bilateral   . CHOLECYSTECTOMY OPEN  11/2003   [name]  . CORONARY ANGIOPLASTY WITH STENT PLACEMENT  01/03/2012   Successful DES to SVG-RCA and cutting balloon angioplasty ostial  PDA   . CORONARY ANGIOPLASTY WITH STENT PLACEMENT  01/14/2014   "1"  . CORONARY ARTERY BYPASS GRAFT  11/1999   CABG X5  . CORONARY STENT PLACEMENT  02/2012   1 stent and balloon  . ESOPHAGOGASTRODUODENOSCOPY N/A 03/01/2017  Procedure: ESOPHAGOGASTRODUODENOSCOPY (EGD);  Surgeon: Irene Shipper, MD;  Location: [facility];  Service: Endoscopy;  Laterality: N/A;  . ESOPHAGOGASTRODUODENOSCOPY (EGD) WITH ESOPHAGEAL DILATION  2010  . HAND SURGERY    . JOINT REPLACEMENT    . KNEE ARTHROSCOPY Right 2008  . LEFT AND RIGHT HEART CATHETERIZATION WITH CORONARY ANGIOGRAM  12/31/2011   Procedure: LEFT AND RIGHT HEART CATHETERIZATION WITH CORONARY ANGIOGRAM;  Surgeon: Burnell Blanks, MD;  Location: [facility] CATH LAB;   Service: Cardiovascular;;  . LEFT AND RIGHT HEART CATHETERIZATION WITH CORONARY ANGIOGRAM N/A 01/14/2014   Procedure: LEFT AND RIGHT HEART CATHETERIZATION WITH CORONARY ANGIOGRAM;  Surgeon: Peter M Martinique, MD;  Location: [facility] CATH LAB;  Service: Cardiovascular;  Laterality: N/A;  . MALONEY DILATION  03/01/2017   Procedure: Venia Minks DILATION;  Surgeon: Irene Shipper, MD;  Location: [facility];  Service: Endoscopy;;  . PERCUTANEOUS CORONARY INTERVENTION-BALLOON ONLY  01/03/2012   Procedure: PERCUTANEOUS CORONARY INTERVENTION-BALLOON ONLY;  Surgeon: Peter M Martinique, MD;  Location: [facility] CATH LAB;  Service: Cardiovascular;;  . PERCUTANEOUS CORONARY STENT INTERVENTION (PCI-S)  12/31/2011   Procedure: PERCUTANEOUS CORONARY STENT INTERVENTION (PCI-S);  Surgeon: Burnell Blanks, MD;  Location: [facility] CATH LAB;  Service: Cardiovascular;;  . PERCUTANEOUS CORONARY STENT INTERVENTION (PCI-S) N/A 01/03/2012   Procedure: PERCUTANEOUS CORONARY STENT INTERVENTION (PCI-S);  Surgeon: Peter M Martinique, MD;  Location: [facility] CATH LAB;  Service: Cardiovascular;  Laterality: N/A;  . SHOULDER ARTHROSCOPY WITH OPEN ROTATOR CUFF REPAIR AND DISTAL CLAVICLE ACROMINECTOMY Left 02/27/2013   Procedure: LEFT SHOULDER ARTHROSCOPY WITH MINI OPEN ROTATOR CUFF REPAIR AND SUBACROMIAL DECOMPRESSION AND DISTAL CLAVICLE RESECTION;  Surgeon: [name], MD;  Location: [place];  Service: Orthopedics;  Laterality: Left;  . TOTAL KNEE ARTHROPLASTY Right 03/2010   [name]  . TRIGGER FINGER RELEASE Left 02/27/2013   Procedure: RELEASE TRIGGER FINGER/A-1 PULLEY;  Surgeon: [name], MD;  Location: [place];  Service: Orthopedics;  Laterality: Left;    Allergies  Allergen Reactions  . Doxazosin Mesylate Other (See Comments)    dizziness  . Methocarbamol Rash    Immunization History  Administered Date(s) Administered  . H1N1 03/27/2008  . Influenza Split 12/29/2010, 01/18/2012  . Influenza Whole 02/03/2007, 01/09/2008, 01/01/2009,  12/31/2009  . Influenza,inj,Quad PF,6+ Mos 12/13/2012, 01/15/2014, 01/15/2015, 01/06/2016, 01/03/2017  . Pneumococcal Conjugate-13 09/10/2013  . Pneumococcal Polysaccharide-23 03/05/1997, 08/17/2011  . Td 03/05/1997, 08/17/2011    Family History  Problem Relation Age of Onset  . COPD Mother   . Heart disease Father   . Heart attack Father   . Diabetes Brother   . Colon cancer Brother 25  . Alcohol abuse Sister   . Stroke Sister      Current Outpatient Medications:  .  AMITIZA 8 MCG capsule, , Disp: , Rfl:  .  amlodipine-atorvastatin (CADUET) 10-10 MG tablet, Take by mouth., Disp: , Rfl:  .  aspirin EC 81 MG tablet, Take 81 mg by mouth at bedtime., Disp: , Rfl:  .  azaTHIOprine (IMURAN) 50 MG tablet, Take 50 mg by mouth., Disp: , Rfl:  .  Blood Glucose Monitoring Suppl (ONE TOUCH ULTRA 2) w/Device KIT, Use to obtain blood sugar daily. Dx Code E11.40, Disp: 1 each, Rfl: 0 .  carvedilol (COREG) 3.125 MG tablet, Take 1 tablet (3.125 mg total) by mouth 2 (two) times daily with a meal., Disp: 180 tablet, Rfl: 3 .  cholecalciferol (VITAMIN D) 1000 units tablet, Take 2,000 Units by mouth daily. , Disp: , Rfl:  .  clopidogrel (PLAVIX) 75 MG tablet, TAKE 1 TABLET BY MOUTH DAILY WITH BREAKFAST., Disp: 30 tablet, Rfl: 10 .  DHA-Vitamin C-Lutein (EYE HEALTH FORMULA PO), Take 1 tablet by mouth daily. , Disp: , Rfl:  .  ferrous sulfate 324 (65 Fe) MG TBEC, Take 1 tablet by mouth daily., Disp: , Rfl:  .  furosemide (LASIX) 40 MG tablet, TAKE 1 TO 2 TABLETS BY MOUTH EVERY DAY, Disp: 60 tablet, Rfl: 11 .  glucose blood (ONE TOUCH ULTRA TEST) test strip, USE TO CHECK BLOOD SUGAR ONCE A DAY Dx Code E11.40, Disp: 100 each, Rfl: 3 .  HYDROcodone-acetaminophen (NORCO) 10-325 MG tablet, Take 1 tablet by mouth 3 (three) times daily as needed for severe pain. For chronic pain To fill on or after: 07/14/17, 08/12/17 Ok to fill 1 day early if pharmacy closed on fill date, Disp: 90 tablet, Rfl: 0 .  isosorbide  dinitrate (ISORDIL) 30 MG tablet, Take 30 mg by mouth., Disp: , Rfl:  .  losartan (COZAAR) 100 MG tablet, Take 100 mg by mouth., Disp: , Rfl:  .  metFORMIN (GLUCOPHAGE) 1000 MG tablet, TAKE 1 TABLET BY MOUTH ONCE DAILY WITH BREAKFAST, Disp: 30 tablet, Rfl: 11 .  Multiple Vitamin (MULTIVITAMIN WITH MINERALS) TABS tablet, Take 1 tablet by mouth daily., Disp: , Rfl:  .  ondansetron (ZOFRAN) 4 MG tablet, TAKE 1 TABLET BY MOUTH EVERY 8 HOURS AS NEEDED FOR NAUSEA OR VOMITING, Disp: 60 tablet, Rfl: 0 .  ONETOUCH DELICA LANCETS 41D MISC, 1 each by In Vitro route daily. Dx Code E11.49, Disp: 100 each, Rfl: 3 .  OXYGEN, Inhale into the lungs. Per pt- Uses Oxygen 2 liters at night., Disp: , Rfl:  .  pantoprazole (PROTONIX) 40 MG tablet, TAKE 1 TABLET BY MOUTH TWICE (2) DAILY, Disp: 60 tablet, Rfl: 11 .  pregabalin (LYRICA) 100 MG capsule, Take 100 mg by mouth., Disp: , Rfl:  .  atorvastatin (LIPITOR) 80 MG tablet, , Disp: , Rfl:  .  nitroGLYCERIN (NITROSTAT) 0.4 MG SL tablet, DISSOLVE 1 TABLET UNDER THE TONGUE FOR CHEST PAIN. MAY REPEAT EVERY 5MINUTES UP TO 3 DOSES. IF NO RELIEF, CALL 911** (Patient not taking: Reported on 08/01/2017), Disp: 25 tablet, Rfl: 5 \  Review of Systems     Objective:   Physical Exam  Constitutional: He is oriented to person, place, and time. He appears well-developed and well-nourished. No distress.  HENT:  Head: Normocephalic and atraumatic.  Right Ear: External ear normal.  Left Ear: External ear normal.  Mouth/Throat: Oropharynx is clear and moist. No oropharyngeal exudate.  Eyes: Pupils are equal, round, and reactive to light. Conjunctivae and EOM are normal. Right eye exhibits no discharge. Left eye exhibits no discharge. No scleral icterus.  Neck: Normal range of motion. Neck supple. No JVD present. No tracheal deviation present. No thyromegaly present.  Cardiovascular: Normal rate, regular rhythm and intact distal pulses. Exam reveals no gallop and no friction rub.   No murmur heard. Pulmonary/Chest: Effort normal. No respiratory distress. He has no wheezes. He has rales. He exhibits no tenderness.  Abdominal: Soft. Bowel sounds are normal. He exhibits no distension and no mass. There is no tenderness. There is no rebound and no guarding.  Musculoskeletal: Normal range of motion. He exhibits no edema or tenderness.  Lymphadenopathy:    He has no cervical adenopathy.  Neurological: He is alert and oriented to person, place, and time. He has normal reflexes. No cranial nerve deficit. Coordination normal.  Skin: Skin is  warm and dry. No rash noted. He is not diaphoretic. No erythema. No pallor.  Psychiatric: He has a normal mood and affect. His behavior is normal. Judgment and thought content normal.  Nursing note and vitals reviewed.  Vitals:   08/01/17 1114  BP: 128/70  Pulse: 70  SpO2: 95%  Weight: 208 lb (94.3 kg)  Height: '6\' 1"'$  (1.854 m)   Estimated body mass index is 27.44 kg/m as calculated from the following:   Height as of this encounter: '6\' 1"'$  (1.854 m).   Weight as of this encounter: 208 lb (94.3 kg).     Assessment:       ICD-10-CM   1. ILD (interstitial lung disease) ([place]) J84.9   2. Dyspnea, unspecified type R06.00        Plan:       ILD stable Glad shortness of breath some better with anemia improvement via iron tablets  Plan Continue anemia workup with colonoscopy Ok to go back on immuran when you see Dr Bronson Curb next   - no rush as long as next few months is fine  - I want the anemia workup completed before immuran restart  Followup 6 months or sooner if needed    Dr. Brand Males, M.D., [facility].C.P Pulmonary and Critical Care Medicine Staff Physician, [place] Director - Interstitial Lung Disease  Program  Pulmonary [place] at [place], [place], [address]  Pager: [phone], If no answer or between  15:00h - 7:00h: call [phone] Telephone: [phone]

## 2017-08-01 NOTE — Telephone Encounter (Signed)
Hi Shaili  Ok for [employer] to go back on immuran but I would wait a few more months until his anemia issues are resolved. He is due for colonoscopy next week  Thanks  Dr. Brand Males, M.D., [facility].C.P Pulmonary and Critical Care Medicine Staff Physician, [place] Director - Interstitial Lung Disease  Program  Pulmonary [place] at [place], [place], [address]  Pager: [phone], If no answer or between  15:00h - 7:00h: call [phone] Telephone: [phone]

## 2017-08-04 ENCOUNTER — Encounter: Payer: Self-pay | Admitting: Internal Medicine

## 2017-08-15 DIAGNOSIS — H35372 Puckering of macula, left eye: Secondary | ICD-10-CM | POA: Diagnosis not present

## 2017-08-15 DIAGNOSIS — D3131 Benign neoplasm of right choroid: Secondary | ICD-10-CM | POA: Diagnosis not present

## 2017-08-15 DIAGNOSIS — R0689 Other abnormalities of breathing: Secondary | ICD-10-CM | POA: Diagnosis not present

## 2017-08-15 DIAGNOSIS — H353213 Exudative age-related macular degeneration, right eye, with inactive scar: Secondary | ICD-10-CM | POA: Diagnosis not present

## 2017-08-15 DIAGNOSIS — H353221 Exudative age-related macular degeneration, left eye, with active choroidal neovascularization: Secondary | ICD-10-CM | POA: Diagnosis not present

## 2017-08-15 DIAGNOSIS — J8410 Pulmonary fibrosis, unspecified: Secondary | ICD-10-CM | POA: Diagnosis not present

## 2017-08-16 ENCOUNTER — Ambulatory Visit (INDEPENDENT_AMBULATORY_CARE_PROVIDER_SITE_OTHER): Payer: Medicare Other | Admitting: Internal Medicine

## 2017-08-16 ENCOUNTER — Encounter: Payer: Self-pay | Admitting: Internal Medicine

## 2017-08-16 VITALS — BP 124/80 | HR 62 | Ht 73.0 in | Wt 206.0 lb

## 2017-08-16 DIAGNOSIS — H6120 Impacted cerumen, unspecified ear: Secondary | ICD-10-CM | POA: Insufficient documentation

## 2017-08-16 DIAGNOSIS — H6122 Impacted cerumen, left ear: Secondary | ICD-10-CM

## 2017-08-16 NOTE — Assessment & Plan Note (Signed)
Lavage done with clearance of the canal Discussed dilute H2O2 regularly to keep build up from recurring

## 2017-08-16 NOTE — Progress Notes (Signed)
Subjective:    Patient ID: [name], male    DOB: [birth year], 84 y.o.   MRN: [id number]  HPI Here due to concerns about wax in his ears Having trouble getting the hearing aide in right Hearing is not right--- aide blocked  No pain  Current Outpatient Medications on File Prior to Visit  Medication Sig Dispense Refill  . AMITIZA 8 MCG capsule     . aspirin EC 81 MG tablet Take 81 mg by mouth at bedtime.    [name] atorvastatin (LIPITOR) 80 MG tablet     . Blood Glucose Monitoring Suppl (ONE TOUCH ULTRA 2) w/Device KIT Use to obtain blood sugar daily. Dx Code E11.40 1 each 0  . carvedilol (COREG) 3.125 MG tablet Take 1 tablet (3.125 mg total) by mouth 2 (two) times daily with a meal. 180 tablet 3  . cholecalciferol (VITAMIN D) 1000 units tablet Take 2,000 Units by mouth daily.     . clopidogrel (PLAVIX) 75 MG tablet TAKE 1 TABLET BY MOUTH DAILY WITH BREAKFAST. 30 tablet 10  . DHA-Vitamin C-Lutein (EYE HEALTH FORMULA PO) Take 1 tablet by mouth daily.     . ferrous sulfate 324 (65 Fe) MG TBEC Take 1 tablet by mouth daily.    . furosemide (LASIX) 40 MG tablet TAKE 1 TO 2 TABLETS BY MOUTH EVERY DAY 60 tablet 11  . glucose blood (ONE TOUCH ULTRA TEST) test strip USE TO CHECK BLOOD SUGAR ONCE A DAY Dx Code E11.40 100 each 3  . metFORMIN (GLUCOPHAGE) 1000 MG tablet TAKE 1 TABLET BY MOUTH ONCE DAILY WITH BREAKFAST 30 tablet 11  . nitroGLYCERIN (NITROSTAT) 0.4 MG SL tablet DISSOLVE 1 TABLET UNDER THE TONGUE FOR CHEST PAIN. MAY REPEAT EVERY 5MINUTES UP TO 3 DOSES. IF NO RELIEF, CALL 911** 25 tablet 5  . ondansetron (ZOFRAN) 4 MG tablet TAKE 1 TABLET BY MOUTH EVERY 8 HOURS AS NEEDED FOR NAUSEA OR VOMITING 60 tablet 0  . ONETOUCH DELICA LANCETS 76E MISC 1 each by In Vitro route daily. Dx Code E11.49 100 each 3  . OXYGEN Inhale into the lungs. Per pt- Uses Oxygen 2 liters at night.    . pantoprazole (PROTONIX) 40 MG tablet TAKE 1 TABLET BY MOUTH TWICE (2) DAILY 60 tablet 11   No current  facility-administered medications on file prior to visit.     Allergies  Allergen Reactions  . Doxazosin Mesylate Other (See Comments)    dizziness  . Methocarbamol Rash    Past Medical History:  Diagnosis Date  . Arthritis    osteoarthritis, s/p R TKR, and digits  . CAD (coronary artery disease)    a. s/p CABG (2001)  b. s/p DES to RCA and cutting POBA to ostial PDA (2013)   c. s/p DES to SVG to OM2 (01/14/14) d. cath: 08/2015 NSTEMI w/ patent LIMA-LAD and 99% stenosis of SVG-OM w/ DES placed. CTO of SVG-RCA and SVG-D1.   [name] Chronic diastolic CHF (congestive heart failure) ([place])    a) 09/13 ECHO- LVEF 83-15%, grade 1 diastolic dysfunction, mild LA dilatation, atrial septal aneurysm, AV mobility restricted, but no sig AS by doppler; b) 09/04/08 ECHO- LVH, ef 60%, mild AS, c. echo 08/2015: EF perserved of 55-60% with inferolateral HK. Mild AS noted.  . Chronic kidney disease, stage III (moderate) ([place])   . Chronic lower back pain   . Colon polyps   . Diverticulosis   . Dyspnea 2009 since July -Sept   05/06/08-CPST-  normal effort,  reduced VO2 max 20.5 /65%, reduced at 8.2/ 40%, normal breathing resetvca of 55%, submaximal heart rate response 112/77%, flattened o2 pluse response at peak exercise-12 ml/beat @ 85%, No VQ mismatch abnormalities, All c/w CIRC Limitation  . Enlarged prostate   . Esophageal stricture    a. s/p dilation spring 2010  . GERD (gastroesophageal reflux disease)   . Heart murmur   . Hiatal hernia   . History of kidney stones   . History of PFTs    mixed pattern on spiro. mild restn on lung volumes with near normal DLCO. Pattern can be explained by CABG scar. Fev1 2.2L/73%, ratio 68 (67), TLC 4.7/68%,RV 1.5L/55%,DLCO 79%  . Hyperlipidemia   . Hypertension   . Interstitial lung disease ([name])    NOS  . Nausea & vomiting    2018/2019  . Overweight (BMI 25.0-29.9)    BMI 29  . RA (rheumatoid arthritis) ([name])    [name]  . Seropositive rheumatoid arthritis  ([place])   . Type II diabetes mellitus ([place])     Past Surgical History:  Procedure Laterality Date  . CARDIAC CATHETERIZATION  08/2004   CP- no MI, Cath- small vessell disease   . CARDIAC CATHETERIZATION  12/31/2011   80% distal LM, 100% native LAD, LCx and RCA, 30% prox SVG-OM, SVG-D1 normal, 99% distal, 80% ostial SVG-RCA distal to graft, LIMA-LAD normal; LVEF mildly decreased with posterior basal AK   . CARDIAC CATHETERIZATION  2009   with patent grafts/notes 12/31/2011  . CARDIAC CATHETERIZATION N/A 08/13/2015   Procedure: Left Heart Cath and Cors/Grafts Angiography;  Surgeon: Sherren Mocha, MD;  Location: [facility];  Service: Cardiovascular;  Laterality: N/A;  . CATARACT EXTRACTION W/ INTRAOCULAR LENS  IMPLANT, BILATERAL Bilateral   . CHOLECYSTECTOMY OPEN  11/2003   [name]  . CORONARY ANGIOPLASTY WITH STENT PLACEMENT  01/03/2012   Successful DES to SVG-RCA and cutting balloon angioplasty ostial  PDA   . CORONARY ANGIOPLASTY WITH STENT PLACEMENT  01/14/2014   "1"  . CORONARY ARTERY BYPASS GRAFT  11/1999   CABG X5  . CORONARY STENT PLACEMENT  02/2012   1 stent and balloon  . ESOPHAGOGASTRODUODENOSCOPY N/A 03/01/2017   Procedure: ESOPHAGOGASTRODUODENOSCOPY (EGD);  Surgeon: Irene Shipper, MD;  Location: [facility];  Service: Endoscopy;  Laterality: N/A;  . ESOPHAGOGASTRODUODENOSCOPY (EGD) WITH ESOPHAGEAL DILATION  2010  . HAND SURGERY    . JOINT REPLACEMENT    . KNEE ARTHROSCOPY Right 2008  . LEFT AND RIGHT HEART CATHETERIZATION WITH CORONARY ANGIOGRAM  12/31/2011   Procedure: LEFT AND RIGHT HEART CATHETERIZATION WITH CORONARY ANGIOGRAM;  Surgeon: Burnell Blanks, MD;  [facility] CATH LAB;  Service: Cardiovascular;;  . LEFT AND RIGHT HEART CATHETERIZATION WITH CORONARY ANGIOGRAM N/A 01/14/2014   Procedure: LEFT AND RIGHT HEART CATHETERIZATION WITH CORONARY ANGIOGRAM;  Surgeon: Peter M Martinique, MD;  [facility];  Service: Cardiovascular;  Laterality: N/A;  .  MALONEY DILATION  03/01/2017   Procedure: Venia Minks DILATION;  Surgeon: Irene Shipper, MD;  Location: [facility];  Service: Endoscopy;;  . PERCUTANEOUS CORONARY INTERVENTION-BALLOON ONLY  01/03/2012   Procedure: PERCUTANEOUS CORONARY INTERVENTION-BALLOON ONLY;  Surgeon: Peter M Martinique, MD;  Location: [facility] CATH LAB;  Service: Cardiovascular;;  . PERCUTANEOUS CORONARY STENT INTERVENTION (PCI-S)  12/31/2011   Procedure: PERCUTANEOUS CORONARY STENT INTERVENTION (PCI-S);  Surgeon: Burnell Blanks, MD;  Location: [facility] CATH LAB;  Service: Cardiovascular;;  . PERCUTANEOUS CORONARY STENT INTERVENTION (PCI-S) N/A 01/03/2012   Procedure: PERCUTANEOUS CORONARY STENT  INTERVENTION (PCI-S);  Surgeon: Peter M Martinique, MD;  Location: [facility] CATH LAB;  Service: Cardiovascular;  Laterality: N/A;  . SHOULDER ARTHROSCOPY WITH OPEN ROTATOR CUFF REPAIR AND DISTAL CLAVICLE ACROMINECTOMY Left 02/27/2013   Procedure: LEFT SHOULDER ARTHROSCOPY WITH MINI OPEN ROTATOR CUFF REPAIR AND SUBACROMIAL DECOMPRESSION AND DISTAL CLAVICLE RESECTION;  Surgeon: [name], MD;  Location: [facility];  Service: Orthopedics;  Laterality: Left;  . TOTAL KNEE ARTHROPLASTY Right 03/2010   [name]  . TRIGGER FINGER RELEASE Left 02/27/2013   Procedure: RELEASE TRIGGER FINGER/A-1 PULLEY;  Surgeon: [name], MD;  Location: [facility];  Service: Orthopedics;  Laterality: Left;    Family History  Problem Relation Age of Onset  . COPD Mother   . Heart disease Father   . Heart attack Father   . Diabetes Brother   . Colon cancer Brother 25  . Alcohol abuse Sister   . Stroke Sister     Social History   Socioeconomic History  . Marital status: Married    Spouse name: Not on file  . Number of children: 3  . Years of education: Not on file  . Highest education level: Not on file  Occupational History  . 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 Financial resource strain: Not on file  . Food  insecurity:    Worry: Not on file    Inability: Not on file  . Transportation needs:    Medical: Not on file    Non-medical: Not on file  Tobacco Use  . Smoking status: Former Smoker    Packs/day: 1.00    Years: 20.00    Pack years: 20.00    Types: Cigarettes    Last attempt to quit: 04/06/1963    Years since quitting: 54.4  . Smokeless tobacco: Never Used  Substance and Sexual Activity  . Alcohol use: No    Alcohol/week: 0.0 oz    Comment: 01/01/2012 "last alcohol ~ 50 yr ago"  . Drug use: No  . Sexual activity: Not Currently  Lifestyle  . Physical activity:    Days per week: Not on file    Minutes per session: Not on file  . Stress: Not on file  Relationships  . Social connections:    Talks on phone: Not on file    Gets together: Not on file    Attends religious service: Not on file    Active member of club or organization: Not on file    Attends meetings of clubs or organizations: Not on file    Relationship status: Not on file  . Intimate partner violence:    Fear of current or ex partner: Not on file    Emotionally abused: Not on file    Physically abused: Not on file    Forced sexual activity: Not on file  Other Topics Concern  . Not on file  Social History Narrative   No living will   Requests wife as health care POA   Discussed DNR --he requests this (done 08/29/12)   Not sure about feeding tube---but might accept for some time   Patient lives with wife and daughter in a one story home.  Has 3 children.  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   Review of Systems  No Q-tips At most rare tinnitus     Objective:   Physical Exam  HENT:  No tragal or pinna tenderness bilaterally Right  canal fairly clear--no inflammation  Left canal ~90% blocked with cerumen          Assessment & Plan:

## 2017-08-18 NOTE — Progress Notes (Signed)
Office Visit Note  Patient: [name]             Date of Birth: [birth year]           MRN: [id number]             PCP: [name], MD Referring: [name], MD Visit Date: 09/01/2017 Occupation: @GUAROCC @    Subjective:  Hand stiffness.   History of Present Illness: [name] is a 84 y.o. male with history of seropositive rheumatoid arthritis and interstitial lung disease.  He was initially on Imuran but was switched to CellCept per recommendation of pulmonologist.  Patient developed side effects from CellCept and Bactrim combination.  He had severe anemia and the medication was discontinued.  Per Dr. Golden Pop recommendation he should go back on Imuran.  He continues to have some pain and stiffness in his hands otherwise he does not have any joint swelling.  Reports that he had recent endoscopy and colonoscopy which was normal.  Activities of Daily Living:  Patient reports morning stiffness for 30 minutes.   Patient Reports nocturnal pain.  Difficulty dressing/grooming: Denies Difficulty climbing stairs: Denies Difficulty getting out of chair: Denies Difficulty using hands for taps, buttons, cutlery, and/or writing: Denies   Review of Systems  Constitutional: Negative for fatigue, fever and night sweats.  HENT: Negative for ear pain, mouth sores, mouth dryness and nose dryness.   Eyes: Negative for pain, redness and dryness.  Respiratory: Negative for cough, shortness of breath and difficulty breathing.   Cardiovascular: Negative for chest pain, palpitations, hypertension, irregular heartbeat and swelling in legs/feet.  Gastrointestinal: Positive for constipation. Negative for diarrhea.  Endocrine: Negative for increased urination.  Genitourinary: Positive for difficulty urinating.  Musculoskeletal: Positive for arthralgias, joint pain, joint swelling and morning stiffness. Negative for myalgias, muscle weakness, muscle tenderness and myalgias.    Skin: Positive for hair loss. Negative for color change, rash, nodules/bumps, skin tightness, ulcers and sensitivity to sunlight.  Allergic/Immunologic: Negative for susceptible to infections.  Neurological: Negative for dizziness, fainting, memory loss, night sweats and weakness ( ).  Hematological: Negative for bruising/bleeding tendency and swollen glands.  Psychiatric/Behavioral: Positive for sleep disturbance. Negative for depressed mood. The patient is not nervous/anxious.     PMFS History:  Patient Active Problem List   Diagnosis Date Noted  . Cerumen impaction 08/16/2017  . Dysphagia   . Esophageal stricture   . High risk medication use 10/21/2016  . Primary osteoarthritis of both knees 10/21/2016  . History of right knee joint replacement 10/21/2016  . Orthostatic hypotension 10/01/2016  . Diarrhea 08/18/2016  . Tegretol-induced dizziness 05/14/2016  . Spinal stenosis of lumbar region without neurogenic claudication 03/23/2016  . Spondylosis without myelopathy or radiculopathy, lumbar region 03/23/2016  . Respiratory failure, chronic ([place]) 11/21/2014  . DM (diabetes mellitus) type II controlled, neurological manifestation ([place])   . Atherosclerotic heart disease of native coronary artery with angina pectoris ([place])   . Ventral hernia 12/17/2013  . Seropositive rheumatoid arthritis ([place])   . Routine general medical examination at a health care facility 08/29/2012  . CKD (chronic kidney disease) stage 4, GFR 15-29 ml/min ([name])   . ILD (interstitial lung disease) ([place]) 11/28/2011  . Chronic diastolic heart failure ([place]) 09/14/2011  . Nausea 02/16/2011  . Obstructive sleep apnea 12/17/2008  . ESOPHAGEAL STRICTURE 10/09/2008  . Reflux esophagitis 09/10/2008  . DIVERTICULOSIS-COLON 09/10/2008  . Thoracic aorta atherosclerosis ([place]) 03/19/2008  . ACTINIC KERATOSIS 10/23/2007  . SLEEP DISORDER,  CHRONIC 10/17/2006  . HLD (hyperlipidemia) 09/23/2006  . Essential hypertension  09/23/2006  . GERD 09/23/2006  . BENIGN PROSTATIC HYPERTROPHY 09/23/2006    Past Medical History:  Diagnosis Date  . Arthritis    osteoarthritis, s/p R TKR, and digits  . CAD (coronary artery disease)    a. s/p CABG (2001)  b. s/p DES to RCA and cutting POBA to ostial PDA (2013)   c. s/p DES to SVG to OM2 (01/14/14) d. cath: 08/2015 NSTEMI w/ patent LIMA-LAD and 99% stenosis of SVG-OM w/ DES placed. CTO of SVG-RCA and SVG-D1.   [name] Chronic diastolic CHF (congestive heart failure) ([place])    a) 09/13 ECHO- LVEF 53-29%, grade 1 diastolic dysfunction, mild LA dilatation, atrial septal aneurysm, AV mobility restricted, but no sig AS by doppler; b) 09/04/08 ECHO- LVH, ef 60%, mild AS, c. echo 08/2015: EF perserved of 55-60% with inferolateral HK. Mild AS noted.  . Chronic kidney disease, stage III (moderate) ([place])   . Chronic lower back pain   . Colon polyps   . Diverticulosis   . Dyspnea 2009 since July -Sept   05/06/08-CPST-  normal effort, reduced VO2 max 20.5 /65%, reduced at 8.2/ 40%, normal breathing resetvca of 55%, submaximal heart rate response 112/77%, flattened o2 pluse response at peak exercise-12 ml/beat @ 85%, No VQ mismatch abnormalities, All c/w CIRC Limitation  . Enlarged prostate   . Esophageal stricture    a. s/p dilation spring 2010  . GERD (gastroesophageal reflux disease)   . Heart murmur   . Hiatal hernia   . History of kidney stones   . History of PFTs    mixed pattern on spiro. mild restn on lung volumes with near normal DLCO. Pattern can be explained by CABG scar. Fev1 2.2L/73%, ratio 68 (67), TLC 4.7/68%,RV 1.5L/55%,DLCO 79%  . Hyperlipidemia   . Hypertension   . Interstitial lung disease ([name])    NOS  . Nausea & vomiting    2018/2019  . Overweight (BMI 25.0-29.9)    BMI 29  . RA (rheumatoid arthritis) ([name])    [name]  . Seropositive rheumatoid arthritis ([place])   . Type II diabetes mellitus ([name])     Family History  Problem Relation Age of Onset  . COPD  Mother   . Heart disease Father   . Heart attack Father   . Diabetes Brother   . Colon cancer Brother 25  . Alcohol abuse Sister   . Stroke Sister    Past Surgical History:  Procedure Laterality Date  . CARDIAC CATHETERIZATION  08/2004   CP- no MI, Cath- small vessell disease   . CARDIAC CATHETERIZATION  12/31/2011   80% distal LM, 100% native LAD, LCx and RCA, 30% prox SVG-OM, SVG-D1 normal, 99% distal, 80% ostial SVG-RCA distal to graft, LIMA-LAD normal; LVEF mildly decreased with posterior basal AK   . CARDIAC CATHETERIZATION  2009   with patent grafts/notes 12/31/2011  . CARDIAC CATHETERIZATION N/A 08/13/2015   Procedure: Left Heart Cath and Cors/Grafts Angiography;  Surgeon: Sherren Mocha, MD;  Location: [facility];  Service: Cardiovascular;  Laterality: N/A;  . CATARACT EXTRACTION W/ INTRAOCULAR LENS  IMPLANT, BILATERAL Bilateral   . CHOLECYSTECTOMY OPEN  11/2003   [name]  . CORONARY ANGIOPLASTY WITH STENT PLACEMENT  01/03/2012   Successful DES to SVG-RCA and cutting balloon angioplasty ostial  PDA   . CORONARY ANGIOPLASTY WITH STENT PLACEMENT  01/14/2014   "1"  . CORONARY ARTERY BYPASS GRAFT  11/1999  CABG X5  . CORONARY STENT PLACEMENT  02/2012   1 stent and balloon  . ESOPHAGOGASTRODUODENOSCOPY N/A 03/01/2017   Procedure: ESOPHAGOGASTRODUODENOSCOPY (EGD);  Surgeon: Irene Shipper, MD;  Location: [facility];  Service: Endoscopy;  Laterality: N/A;  . ESOPHAGOGASTRODUODENOSCOPY (EGD) WITH ESOPHAGEAL DILATION  2010  . HAND SURGERY    . JOINT REPLACEMENT    . KNEE ARTHROSCOPY Right 2008  . LEFT AND RIGHT HEART CATHETERIZATION WITH CORONARY ANGIOGRAM  12/31/2011   Procedure: LEFT AND RIGHT HEART CATHETERIZATION WITH CORONARY ANGIOGRAM;  Surgeon: Burnell Blanks, MD;  Location[facility] C[facility] LAB;  Service: Cardiovascular;;  . LEFT AND RIGHT HEART CATHETERIZATION WITH CORONARY ANGIOGRAM N/A 01/14/2014   Procedure: LEFT AND RIGHT HEART CATHETERIZATION WITH CORONARY  ANGIOGRAM;  Surgeon: Peter M Martinique, MD;  Location: [facility] CATH LAB;  Service: Cardiovascular;  Laterality: N/A;  . MALONEY DILATION  03/01/2017   Procedure: Venia Minks DILATION;  Surgeon: Irene Shipper, MD;  Location: [facility];  Service: Endoscopy;;  . PERCUTANEOUS CORONARY INTERVENTION-BALLOON ONLY  01/03/2012   Procedure: PERCUTANEOUS CORONARY INTERVENTION-BALLOON ONLY;  Surgeon: Peter M Martinique, MD;  Location: [facility] CATH LAB;  Service: Cardiovascular;;  . PERCUTANEOUS CORONARY STENT INTERVENTION (PCI-S)  12/31/2011   Procedure: PERCUTANEOUS CORONARY STENT INTERVENTION (PCI-S);  Surgeon: Burnell Blanks, MD;  Location: [facility] CATH LAB;  Service: Cardiovascular;;  . PERCUTANEOUS CORONARY STENT INTERVENTION (PCI-S) N/A 01/03/2012   Procedure: PERCUTANEOUS CORONARY STENT INTERVENTION (PCI-S);  Surgeon: Peter M Martinique, MD;  Location: [facility] CATH LAB;  Service: Cardiovascular;  Laterality: N/A;  . SHOULDER ARTHROSCOPY WITH OPEN ROTATOR CUFF REPAIR AND DISTAL CLAVICLE ACROMINECTOMY Left 02/27/2013   Procedure: LEFT SHOULDER ARTHROSCOPY WITH MINI OPEN ROTATOR CUFF REPAIR AND SUBACROMIAL DECOMPRESSION AND DISTAL CLAVICLE RESECTION;  Surgeon: [name], MD;  Location: [facility];  Service: Orthopedics;  Laterality: Left;  . TOTAL KNEE ARTHROPLASTY Right 03/2010   [name]  . TRIGGER FINGER RELEASE Left 02/27/2013   Procedure: RELEASE TRIGGER FINGER/A-1 PULLEY;  Surgeon: [name], MD;  Location: [facility];  Service: Orthopedics;  Laterality: Left;   Social History   Social History Narrative   No living will   Requests wife as health care POA   Discussed DNR --he requests this (done 08/29/12)   Not sure about feeding tube---but might accept for some time   Patient lives with wife and daughter in a one story home.  Has 3 children.  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     Objective: Vital Signs: BP (!) 171/81 (BP Location: Left Arm, Patient Position: Sitting, Cuff Size: Normal)    Pulse 62   Ht 6\' 1"  (1.854 m)   Wt 206 lb (93.4 kg)   BMI 27.18 kg/m    Physical Exam  Constitutional: He is oriented to person, place, and time. He appears well-developed and well-nourished.  HENT:  Head: Normocephalic and atraumatic.  Eyes: Pupils are equal, round, and reactive to light. Conjunctivae and EOM are normal.  Neck: Normal range of motion. Neck supple.  Cardiovascular: Normal rate, regular rhythm and normal heart sounds.  Pulmonary/Chest: Effort normal. He has rales.  Bilateral lung bases.  Abdominal: Soft. Bowel sounds are normal.  Neurological: He is alert and oriented to person, place, and time.  Skin: Skin is warm and dry. Capillary refill takes less than 2 seconds.  Psychiatric: He has a normal mood and affect. His behavior is normal.  Nursing note and vitals reviewed.    Musculoskeletal Exam: C-spine thoracic lumbar  spine good range of motion.  Shoulder joints elbow joints were in good range of motion.  He had some synovial thickening over MCPs PIPs DIPs but no synovitis was noted.  His right knee joint is replaced which is doing quite well.  He has some osteoarthritic changes in his left knee joint without much discomfort.  CDAI Exam: No CDAI exam completed.    Investigation: No additional findings. CBC Latest Ref Rng & Units 06/24/2017 05/27/2017 04/29/2017  WBC 3.8 - 10.8 Thousand/uL 7.8 9.0 10.0  Hemoglobin 13.2 - 17.1 g/dL 12.0(L) 9.5(L) 8.8 Repeated and verified X2.(L)  Hematocrit 38.5 - 50.0 % 38.2(L) 31.2(L) 28.0(L)  Platelets 140 - 400 Thousand/uL 201 278 304.0   CMP Latest Ref Rng & Units 06/24/2017 05/27/2017 04/29/2017  Glucose 65 - 99 mg/dL 235(H) 168(H) 185(H)  BUN 7 - 25 mg/dL 30(H) 25 31(H)  Creatinine 0.70 - 1.11 mg/dL 2.28(H) 2.43(H) 2.29(H)  Sodium 135 - 146 mmol/L 142 141 137  Potassium 3.5 - 5.3 mmol/L 4.6 4.9 4.5  Chloride 98 - 110 mmol/L 102 99 96  CO2 20 - 32 mmol/L 32 30 31  Calcium 8.6 - 10.3 mg/dL 10.0 9.9 9.7  Total Protein 6.1 -  8.1 g/dL 7.6 7.7 8.1  Total Bilirubin 0.2 - 1.2 mg/dL 0.6 0.7 0.7  Alkaline Phos 39 - 117 U/L - - 67  AST 10 - 35 U/L 17 15 12   ALT 9 - 46 U/L 10 9 9     Imaging: No results found.  Speciality Comments: No specialty comments available.    Procedures:  No procedures performed Allergies: Doxazosin mesylate and Methocarbamol   Assessment / Plan:     Visit Diagnoses: Seropositive rheumatoid arthritis ([place]) - RF + and CCP+.  Patient has no synovitis on examination.  He was treated with Imuran in the past which was discontinued and he was switched to CellCept.  But he could not tolerate CellCept due to nausea vomiting and drop in his hemoglobin.  The CellCept was discontinued.  His hemoglobin resumed to be normal in March 2019.  ILD (interstitial lung disease) ([place]) - Followed up by Dr. Chase Caller.  I had telephone conversation with Dr. Chase Caller regarding Mr. Vanhorn.  He would prefer him to continue Imuran due to underlying ILD.  I will start him on Imuran 50 mg p.o. daily and increase it to 50 mg twice daily after 2 weeks if the labs are normal.  He will need lab work every 2 weeks x 2 and then every 2 to 3 months to monitor for drug toxicity.  High risk medication use - Patient developed nausea, vomiting and severe anemia on CellCept and Bactrim.  He was treated with Imuran in the past.  History of right knee joint replacement-doing well.  Primary osteoarthritis of left knee-he has some chronic pain.  Spondylosis without myelopathy or radiculopathy, lumbar region-chronic discomfort.  Okay  Chronic diastolic heart failure ([name])  History of anemia  CKD (chronic kidney disease) stage 4, GFR 15-29 ml/min ([name])  History of coronary artery disease  History of gastroesophageal reflux (GERD)  History of diabetes mellitus  History of hyperlipidemia  History of sleep apnea  History of hypertension    Orders: Orders Placed This Encounter  Procedures  . CBC with  Differential/Platelet  . COMPLETE METABOLIC PANEL WITH GFR   Meds ordered this encounter  Medications  . azaTHIOprine (IMURAN) 50 MG tablet    Sig: Take 1 tablet daily for 2 weeks, if labs are stable increase  to 1 tablet BID.    Dispense:  42 tablet    Refill:  0    Face-to-face time spent with patient was 30 minutes.> 50% of time was spent in counseling and coordination of care.  Follow-Up Instructions: Return in about 3 months (around 12/02/2017) for Rheumatoid arthritis, ILD.   [name], MD  Note - This record has been created using Editor, commissioning.  Chart creation errors have been sought, but may not always  have been located. Such creation errors do not reflect on  the standard of medical care.

## 2017-08-19 DIAGNOSIS — I2510 Atherosclerotic heart disease of native coronary artery without angina pectoris: Secondary | ICD-10-CM | POA: Diagnosis not present

## 2017-08-19 DIAGNOSIS — K2930 Chronic superficial gastritis without bleeding: Secondary | ICD-10-CM | POA: Diagnosis not present

## 2017-08-19 DIAGNOSIS — D509 Iron deficiency anemia, unspecified: Secondary | ICD-10-CM | POA: Diagnosis not present

## 2017-08-19 DIAGNOSIS — Z955 Presence of coronary angioplasty implant and graft: Secondary | ICD-10-CM | POA: Diagnosis not present

## 2017-08-19 DIAGNOSIS — K222 Esophageal obstruction: Secondary | ICD-10-CM | POA: Diagnosis not present

## 2017-08-19 DIAGNOSIS — K635 Polyp of colon: Secondary | ICD-10-CM | POA: Diagnosis not present

## 2017-08-19 DIAGNOSIS — K3189 Other diseases of stomach and duodenum: Secondary | ICD-10-CM | POA: Diagnosis not present

## 2017-08-19 DIAGNOSIS — K210 Gastro-esophageal reflux disease with esophagitis: Secondary | ICD-10-CM | POA: Diagnosis not present

## 2017-08-19 DIAGNOSIS — Z951 Presence of aortocoronary bypass graft: Secondary | ICD-10-CM | POA: Diagnosis not present

## 2017-08-19 DIAGNOSIS — Z9049 Acquired absence of other specified parts of digestive tract: Secondary | ICD-10-CM | POA: Diagnosis not present

## 2017-08-19 DIAGNOSIS — K449 Diaphragmatic hernia without obstruction or gangrene: Secondary | ICD-10-CM | POA: Diagnosis not present

## 2017-08-19 DIAGNOSIS — E7849 Other hyperlipidemia: Secondary | ICD-10-CM | POA: Diagnosis not present

## 2017-08-19 LAB — HM COLONOSCOPY

## 2017-08-22 ENCOUNTER — Other Ambulatory Visit: Payer: Self-pay

## 2017-08-22 ENCOUNTER — Encounter: Payer: Self-pay | Admitting: Student in an Organized Health Care Education/Training Program

## 2017-08-22 ENCOUNTER — Ambulatory Visit
Payer: Medicare Other | Attending: Student in an Organized Health Care Education/Training Program | Admitting: Student in an Organized Health Care Education/Training Program

## 2017-08-22 ENCOUNTER — Telehealth: Payer: Self-pay | Admitting: *Deleted

## 2017-08-22 VITALS — BP 166/91 | HR 72 | Temp 97.1°F | Ht 73.0 in | Wt 206.0 lb

## 2017-08-22 DIAGNOSIS — E1122 Type 2 diabetes mellitus with diabetic chronic kidney disease: Secondary | ICD-10-CM

## 2017-08-22 DIAGNOSIS — N400 Enlarged prostate without lower urinary tract symptoms: Secondary | ICD-10-CM | POA: Insufficient documentation

## 2017-08-22 DIAGNOSIS — J849 Interstitial pulmonary disease, unspecified: Secondary | ICD-10-CM | POA: Insufficient documentation

## 2017-08-22 DIAGNOSIS — Z9842 Cataract extraction status, left eye: Secondary | ICD-10-CM | POA: Insufficient documentation

## 2017-08-22 DIAGNOSIS — I5032 Chronic diastolic (congestive) heart failure: Secondary | ICD-10-CM | POA: Diagnosis not present

## 2017-08-22 DIAGNOSIS — E785 Hyperlipidemia, unspecified: Secondary | ICD-10-CM | POA: Insufficient documentation

## 2017-08-22 DIAGNOSIS — M170 Bilateral primary osteoarthritis of knee: Secondary | ICD-10-CM | POA: Diagnosis not present

## 2017-08-22 DIAGNOSIS — Z87891 Personal history of nicotine dependence: Secondary | ICD-10-CM | POA: Diagnosis not present

## 2017-08-22 DIAGNOSIS — Z800 Family history of malignant neoplasm of digestive organs: Secondary | ICD-10-CM | POA: Insufficient documentation

## 2017-08-22 DIAGNOSIS — M059 Rheumatoid arthritis with rheumatoid factor, unspecified: Secondary | ICD-10-CM | POA: Diagnosis not present

## 2017-08-22 DIAGNOSIS — I2510 Atherosclerotic heart disease of native coronary artery without angina pectoris: Secondary | ICD-10-CM | POA: Insufficient documentation

## 2017-08-22 DIAGNOSIS — E1142 Type 2 diabetes mellitus with diabetic polyneuropathy: Secondary | ICD-10-CM | POA: Diagnosis not present

## 2017-08-22 DIAGNOSIS — M48061 Spinal stenosis, lumbar region without neurogenic claudication: Secondary | ICD-10-CM | POA: Insufficient documentation

## 2017-08-22 DIAGNOSIS — K219 Gastro-esophageal reflux disease without esophagitis: Secondary | ICD-10-CM | POA: Insufficient documentation

## 2017-08-22 DIAGNOSIS — G629 Polyneuropathy, unspecified: Secondary | ICD-10-CM | POA: Diagnosis not present

## 2017-08-22 DIAGNOSIS — Z7984 Long term (current) use of oral hypoglycemic drugs: Secondary | ICD-10-CM | POA: Insufficient documentation

## 2017-08-22 DIAGNOSIS — Z961 Presence of intraocular lens: Secondary | ICD-10-CM | POA: Insufficient documentation

## 2017-08-22 DIAGNOSIS — Z833 Family history of diabetes mellitus: Secondary | ICD-10-CM | POA: Insufficient documentation

## 2017-08-22 DIAGNOSIS — Z9981 Dependence on supplemental oxygen: Secondary | ICD-10-CM | POA: Diagnosis not present

## 2017-08-22 DIAGNOSIS — G4733 Obstructive sleep apnea (adult) (pediatric): Secondary | ICD-10-CM | POA: Insufficient documentation

## 2017-08-22 DIAGNOSIS — I130 Hypertensive heart and chronic kidney disease with heart failure and stage 1 through stage 4 chronic kidney disease, or unspecified chronic kidney disease: Secondary | ICD-10-CM | POA: Diagnosis not present

## 2017-08-22 DIAGNOSIS — Z7902 Long term (current) use of antithrombotics/antiplatelets: Secondary | ICD-10-CM | POA: Insufficient documentation

## 2017-08-22 DIAGNOSIS — Z96651 Presence of right artificial knee joint: Secondary | ICD-10-CM | POA: Insufficient documentation

## 2017-08-22 DIAGNOSIS — Z7982 Long term (current) use of aspirin: Secondary | ICD-10-CM | POA: Diagnosis not present

## 2017-08-22 DIAGNOSIS — Z9841 Cataract extraction status, right eye: Secondary | ICD-10-CM | POA: Insufficient documentation

## 2017-08-22 DIAGNOSIS — Z811 Family history of alcohol abuse and dependence: Secondary | ICD-10-CM | POA: Insufficient documentation

## 2017-08-22 DIAGNOSIS — Z825 Family history of asthma and other chronic lower respiratory diseases: Secondary | ICD-10-CM | POA: Insufficient documentation

## 2017-08-22 DIAGNOSIS — M79671 Pain in right foot: Secondary | ICD-10-CM | POA: Diagnosis present

## 2017-08-22 DIAGNOSIS — Z87442 Personal history of urinary calculi: Secondary | ICD-10-CM | POA: Diagnosis not present

## 2017-08-22 DIAGNOSIS — Z79899 Other long term (current) drug therapy: Secondary | ICD-10-CM | POA: Diagnosis not present

## 2017-08-22 DIAGNOSIS — Z79891 Long term (current) use of opiate analgesic: Secondary | ICD-10-CM | POA: Insufficient documentation

## 2017-08-22 DIAGNOSIS — G894 Chronic pain syndrome: Secondary | ICD-10-CM | POA: Diagnosis not present

## 2017-08-22 DIAGNOSIS — M47816 Spondylosis without myelopathy or radiculopathy, lumbar region: Secondary | ICD-10-CM | POA: Insufficient documentation

## 2017-08-22 DIAGNOSIS — Z8249 Family history of ischemic heart disease and other diseases of the circulatory system: Secondary | ICD-10-CM | POA: Insufficient documentation

## 2017-08-22 DIAGNOSIS — J9610 Chronic respiratory failure, unspecified whether with hypoxia or hypercapnia: Secondary | ICD-10-CM | POA: Diagnosis not present

## 2017-08-22 DIAGNOSIS — N183 Chronic kidney disease, stage 3 (moderate): Secondary | ICD-10-CM

## 2017-08-22 DIAGNOSIS — Z823 Family history of stroke: Secondary | ICD-10-CM | POA: Insufficient documentation

## 2017-08-22 DIAGNOSIS — Z955 Presence of coronary angioplasty implant and graft: Secondary | ICD-10-CM | POA: Insufficient documentation

## 2017-08-22 MED ORDER — HYDROCODONE-ACETAMINOPHEN 10-325 MG PO TABS: 1.0000 | ORAL_TABLET | Freq: Four times a day (QID) | ORAL | 0 refills | Status: DC | PRN

## 2017-08-22 NOTE — Telephone Encounter (Signed)
Patient was seen today.

## 2017-08-22 NOTE — Progress Notes (Signed)
Patient's Name: [name]  MRN: [id number]  Referring Provider: [name], MD  DOB: [birth year]  PCP: [name], MD  DOS: 08/22/2017  Note by: Gillis Santa, MD  Service setting: Ambulatory outpatient  Specialty: Interventional Pain Management  Location: [facility] (AMB) Pain Management Facility    Patient type: Established   Primary Reason(s) for Visit: Encounter for prescription drug management. (Level of risk: moderate)  CC: Foot Pain (both)  HPI  [name] is a 85 y.o. year old, male patient, who comes today for a medication management evaluation. He has HLD (hyperlipidemia); Obstructive sleep apnea; Essential hypertension; Thoracic aorta atherosclerosis ([place]); Reflux esophagitis; ESOPHAGEAL STRICTURE; GERD; DIVERTICULOSIS-COLON; BENIGN PROSTATIC HYPERTROPHY; ACTINIC KERATOSIS; SLEEP DISORDER, CHRONIC; Nausea; Chronic diastolic heart failure ([name]); ILD (interstitial lung disease) ([place]); CKD (chronic kidney disease) stage 4, GFR 15-29 ml/min ([place]); Routine general medical examination at a health care facility; Seropositive rheumatoid arthritis ([place]); Ventral hernia; DM (diabetes mellitus) type II controlled, neurological manifestation ([place]); Atherosclerotic heart disease of native coronary artery with angina pectoris ([place]); Respiratory failure, chronic ([place]); Spinal stenosis of lumbar region without neurogenic claudication; Spondylosis without myelopathy or radiculopathy, lumbar region; Tegretol-induced dizziness; Diarrhea; Orthostatic hypotension; High risk medication use; Primary osteoarthritis of both knees; History of right knee joint replacement; Dysphagia; Esophageal stricture; and Cerumen impaction on their problem list. His primarily concern today is the Foot Pain (both)  Pain Assessment: Location: Right, Left Foot Radiating:  no Onset:  > 3 months Duration: Chronic pain Quality: Aching, Numbness, Throbbing Severity: 10-Worst pain ever/10 (subjective, self-reported pain  score)  Note: Reported level is compatible with observation.                         When using our objective Pain Scale, levels between 6 and 10/10 are said to belong in an emergency room, as it progressively worsens from a 6/10, described as severely limiting, requiring emergency care not usually available at an outpatient pain management facility. At a 6/10 level, communication becomes difficult and requires great effort. Assistance to reach the emergency department may be required. Facial flushing and profuse sweating along with potentially dangerous increases in heart rate and blood pressure will be evident. Effect on ADL:  limits ability to walk and clean Timing:  worse in the evenings Modifying factors: Ankle down BP: (!) 166/91  HR: 72  [name] was last scheduled for an appointment on 07/07/2017 for medication management. During today's appointment we reviewed [name] chronic pain status, as well as his outpatient medication regimen.  The patient  reports that he does not use drugs. His body mass index is 27.18 kg/m.  Further details on both, my assessment(s), as well as the proposed treatment plan, please see below.  Controlled Substance Pharmacotherapy Assessment REMS (Risk Evaluation and Mitigation Strategy)  Analgesic: Hydrocodone 10 mg TID prn, #90/month MME/day: 30 mg/day.  Chauncey Fischer, RN  08/22/2017 12:51 PM  Sign at close encounter Nursing Pain Medication Assessment:  Safety precautions to be maintained throughout the outpatient stay will include: orient to surroundings, keep bed in low position, maintain call bell within reach at all times, provide assistance with transfer out of bed and ambulation.  Medication Inspection Compliance: Pill count conducted under aseptic conditions, in front of the patient. Neither the pills nor the bottle was removed from the patient's sight at any time. Once count was completed pills were immediately returned to the patient in their  original bottle.  Medication: Hydrocodone/APAP  Pill/Patch Count: 0 of 90 pills remain Pill/Patch Appearance: Markings consistent with prescribed medication Bottle Appearance: Standard pharmacy container. Clearly labeled. Filled Date: 4 / 12 / 2019 Last Medication intake:  08/17/17   Pharmacokinetics: Liberation and absorption (onset of action): WNL Distribution (time to peak effect): WNL Metabolism and excretion (duration of action): WNL         Pharmacodynamics: Desired effects: Analgesia: [name] reports >50% benefit. Functional ability: Patient reports that medication does help, but not nearly as much as he would like Clinically meaningful improvement in function (CMIF): Sustained CMIF goals met Perceived effectiveness: Described as relatively effective but with some room for improvement Undesirable effects: Side-effects or Adverse reactions: None reported Monitoring: [place] PMP: Online review of the past 12-month period conducted. Compliant with practice rules and regulations Last UDS on record: Summary  Date Value Ref Range Status  03/17/2017 FINAL  Final    Comment:    ==================================================================== TOXASSURE COMP DRUG ANALYSIS,UR ==================================================================== Test                             Result       Flag       Units Drug Absent but Declared for Prescription Verification   Tramadol                       Not Detected UNEXPECTED ng/mg creat   Salicylate                     Not Detected UNEXPECTED    Aspirin, as indicated in the declared medication list, is not    always detected even when used as directed. ==================================================================== Test                      Result    Flag   Units      Ref Range   Creatinine              80               mg/dL      >=20 ==================================================================== Declared Medications:  The  flagging and interpretation on this report are based on the  following declared medications.  Unexpected results may arise from  inaccuracies in the declared medications.  **Note: The testing scope of this panel includes these medications:  Tramadol  **Note: The testing scope of this panel does not include small to  moderate amounts of these reported medications:  Aspirin (Aspirin 81)  **Note: The testing scope of this panel does not include following  reported medications:  Carvedilol  Cephalexin  Clopidogrel  Furosemide  Metformin  Multivitamin  Mycophenolate mofetil (Cellcept)  Nitroglycerin  Ondansetron  Oxygen  Pantoprazole  Sodium Chloride  Sulfamethoxazole  Trimethoprim ==================================================================== For clinical consultation, please call [phone]. ====================================================================    UDS interpretation: Compliant          Medication Assessment Form: Reviewed. Patient indicates being compliant with therapy Treatment compliance: Compliant Risk Assessment Profile: Aberrant behavior: See prior evaluations. None observed or detected today Comorbid factors increasing risk of overdose: See prior notes. No additional risks detected today Risk of substance use disorder (SUD): Low  ORT Scoring interpretation table:  Score <3 = Low Risk for SUD  Score between 4-7 = Moderate Risk for SUD  Score >8 = High Risk for Opioid Abuse   Risk Mitigation Strategies:  Patient Counseling: Covered Patient-Prescriber Agreement (  PPA): Present and active  Notification to other healthcare providers: Done  Pharmacologic Plan: Increase from Hydrocodone 10 mg TID prn to 10 mg QID prn#120/month             Laboratory Chemistry  Inflammation Markers (CRP: Acute Phase) (ESR: Chronic Phase) Lab Results  Component Value Date   ESRSEDRATE 38 (H) 11/22/2011                         Rheumatology Markers Lab Results   Component Value Date   RF 23 (H) 11/22/2011   ANA NEG 11/22/2011                        Renal Function Markers Lab Results  Component Value Date   BUN 30 (H) 06/24/2017   CREATININE 2.28 (H) 06/24/2017   BCR 13 06/24/2017   GFRAA 30 (L) 06/24/2017   GFRNONAA 26 (L) 06/24/2017                              Hepatic Function Markers Lab Results  Component Value Date   AST 17 06/24/2017   ALT 10 06/24/2017   ALBUMIN 4.2 04/29/2017   ALKPHOS 67 04/29/2017                        Electrolytes Lab Results  Component Value Date   NA 142 06/24/2017   K 4.6 06/24/2017   CL 102 06/24/2017   CALCIUM 10.0 06/24/2017   MG 1.9 04/08/2017   PHOS 3.6 04/08/2016                        Neuropathy Markers Lab Results  Component Value Date   VITAMINB12 253 08/10/2016   FOLATE 17.6 08/10/2016   HGBA1C 8.0 (H) 04/18/2017                        Bone Pathology Markers No results found for: VD25OH, VD125OH2TOT, VD3125OH2, VD2125OH2, 25OHVITD1, 25OHVITD2, 25OHVITD3, TESTOFREE, TESTOSTERONE                       Coagulation Parameters Lab Results  Component Value Date   INR 1.12 08/13/2015   LABPROT 14.6 08/13/2015   APTT 29 08/13/2015   PLT 201 06/24/2017                        Cardiovascular Markers Lab Results  Component Value Date   BNP 318.4 (H) 08/13/2015   CKTOTAL 73 12/31/2011   CKMB 3.4 12/31/2011   TROPONINI 0.68 (HH) 08/14/2015   HGB 12.0 (L) 06/24/2017   HCT 38.2 (L) 06/24/2017                         CA Markers No results found for: CEA, CA125, LABCA2                      Note: Lab results reviewed.  Recent Diagnostic Imaging Results  Cardiac event monitor SR isolated PAC;s and PVC;s  No significant arrhythmias  Complexity Note: Imaging results reviewed. Results shared with [name], using Layman's terms.                         Meds     Current Outpatient Medications:  .  AMITIZA 8 MCG capsule, , Disp: , Rfl:  .  aspirin EC 81 MG tablet, Take 81  mg by mouth at bedtime., Disp: , Rfl:  .  atorvastatin (LIPITOR) 80 MG tablet, , Disp: , Rfl:  .  Blood Glucose Monitoring Suppl (ONE TOUCH ULTRA 2) w/Device KIT, Use to obtain blood sugar daily. Dx Code E11.40, Disp: 1 each, Rfl: 0 .  carvedilol (COREG) 3.125 MG tablet, Take 1 tablet (3.125 mg total) by mouth 2 (two) times daily with a meal., Disp: 180 tablet, Rfl: 3 .  cholecalciferol (VITAMIN D) 1000 units tablet, Take 2,000 Units by mouth daily. , Disp: , Rfl:  .  clopidogrel (PLAVIX) 75 MG tablet, TAKE 1 TABLET BY MOUTH DAILY WITH BREAKFAST., Disp: 30 tablet, Rfl: 10 .  DHA-Vitamin C-Lutein (EYE HEALTH FORMULA PO), Take 1 tablet by mouth daily. , Disp: , Rfl:  .  ferrous sulfate 324 (65 Fe) MG TBEC, Take 1 tablet by mouth daily., Disp: , Rfl:  .  furosemide (LASIX) 40 MG tablet, TAKE 1 TO 2 TABLETS BY MOUTH EVERY DAY, Disp: 60 tablet, Rfl: 11 .  glucose blood (ONE TOUCH ULTRA TEST) test strip, USE TO CHECK BLOOD SUGAR ONCE A DAY Dx Code E11.40, Disp: 100 each, Rfl: 3 .  HYDROcodone-acetaminophen (NORCO) 10-325 MG tablet, Take 1-2 tablets by mouth every 6 (six) hours as needed., Disp: 120 tablet, Rfl: 0 .  metFORMIN (GLUCOPHAGE) 1000 MG tablet, TAKE 1 TABLET BY MOUTH ONCE DAILY WITH BREAKFAST, Disp: 30 tablet, Rfl: 11 .  nitroGLYCERIN (NITROSTAT) 0.4 MG SL tablet, DISSOLVE 1 TABLET UNDER THE TONGUE FOR CHEST PAIN. MAY REPEAT EVERY 5MINUTES UP TO 3 DOSES. IF NO RELIEF, CALL 911**, Disp: 25 tablet, Rfl: 5 .  ondansetron (ZOFRAN) 4 MG tablet, TAKE 1 TABLET BY MOUTH EVERY 8 HOURS AS NEEDED FOR NAUSEA OR VOMITING, Disp: 60 tablet, Rfl: 0 .  ONETOUCH DELICA LANCETS 96E MISC, 1 each by In Vitro route daily. Dx Code E11.49, Disp: 100 each, Rfl: 3 .  OXYGEN, Inhale into the lungs. Per pt- Uses Oxygen 2 liters at night., Disp: , Rfl:  .  pantoprazole (PROTONIX) 40 MG tablet, TAKE 1 TABLET BY MOUTH TWICE (2) DAILY, Disp: 60 tablet, Rfl: 11  ROS  Constitutional: Denies any fever or  chills Gastrointestinal: No reported hemesis, hematochezia, vomiting, or acute GI distress Musculoskeletal: Denies any acute onset joint swelling, redness, loss of ROM, or weakness Neurological: No reported episodes of acute onset apraxia, aphasia, dysarthria, agnosia, amnesia, paralysis, loss of coordination, or loss of consciousness  Allergies  [name] is allergic to doxazosin mesylate and methocarbamol.  [place]  Drug: [name]  reports that he does not use drugs. Alcohol:  reports that he does not drink alcohol. Tobacco:  reports that he quit smoking about 54 years ago. His smoking use included cigarettes. He has a 20.00 pack-year smoking history. He has never used smokeless tobacco. Medical:  has a past medical history of Arthritis, CAD (coronary artery disease), Chronic diastolic CHF (congestive heart failure) ([place]), Chronic kidney disease, stage III (moderate) ([place]), Chronic lower back pain, Colon polyps, Diverticulosis, Dyspnea (2009 since July -Sept), Enlarged prostate, Esophageal stricture, GERD (gastroesophageal reflux disease), Heart murmur, Hiatal hernia, History of kidney stones, History of PFTs, Hyperlipidemia, Hypertension, Interstitial lung disease ([place]), Nausea & vomiting, Overweight (BMI 25.0-29.9), RA (rheumatoid arthritis) ([place]), Seropositive rheumatoid arthritis ([place]), and Type II diabetes mellitus ([place]). Surgical: [name]  has a past surgical history that  includes Total knee arthroplasty (Right, 03/2010); Knee arthroscopy (Right, 2008); Cataract extraction w/ intraocular lens  implant, bilateral (Bilateral); Coronary artery bypass graft (11/1999); Coronary stent placement (02/2012); Shoulder arthroscopy with open rotator cuff repair and distal clavicle acrominectomy (Left, 02/27/2013); Trigger finger release (Left, 02/27/2013); Cholecystectomy open (11/2003); Joint replacement; Esophagogastroduodenoscopy (egd) with esophageal dilation (2010); Cardiac catheterization  (08/2004); Cardiac catheterization (12/31/2011); Coronary angioplasty with stent (01/03/2012); Coronary angioplasty with stent (01/14/2014); Cardiac catheterization (2009); left and right heart catheterization with coronary angiogram (12/31/2011); percutaneous coronary stent intervention (pci-s) (12/31/2011); percutaneous coronary stent intervention (pci-s) (N/A, 01/03/2012); Percutaneous coronary intervention-balloon only (01/03/2012); left and right heart catheterization with coronary angiogram (N/A, 01/14/2014); Hand surgery; Cardiac catheterization (N/A, 08/13/2015); Esophagogastroduodenoscopy (N/A, 03/01/2017); and maloney dilation (03/01/2017). Family: family history includes Alcohol abuse in his sister; COPD in his mother; Colon cancer (age of onset: 25) in his brother; Diabetes in his brother; Heart attack in his father; Heart disease in his father; Stroke in his sister.  Constitutional Exam  General appearance: Well nourished, well developed, and well hydrated. In no apparent acute distress Vitals:   08/22/17 1238  BP: (!) 166/91  Pulse: 72  Temp: (!) 97.1 F (36.2 C)  SpO2: 98%  Weight: 206 lb (93.4 kg)  Height: 6' 1" (1.854 m)   BMI Assessment: Estimated body mass index is 27.18 kg/m as calculated from the following:   Height as of this encounter: 6' 1" (1.854 m).   Weight as of this encounter: 206 lb (93.4 kg).  BMI interpretation table: BMI level Category Range association with higher incidence of chronic pain  <18 kg/m2 Underweight   18.5-24.9 kg/m2 Ideal body weight   25-29.9 kg/m2 Overweight Increased incidence by 20%  30-34.9 kg/m2 Obese (Class I) Increased incidence by 68%  35-39.9 kg/m2 Severe obesity (Class II) Increased incidence by 136%  >40 kg/m2 Extreme obesity (Class III) Increased incidence by 254%   Patient's current BMI Ideal Body weight  Body mass index is 27.18 kg/m. Ideal body weight: 79.9 kg (176 lb 2.4 oz) Adjusted ideal body weight: 85.3 kg (188 lb 1.4 oz)    BMI Readings from Last 4 Encounters:  08/22/17 27.18 kg/m  08/16/17 27.18 kg/m  08/01/17 27.44 kg/m  07/07/17 27.57 kg/m   Wt Readings from Last 4 Encounters:  08/22/17 206 lb (93.4 kg)  08/16/17 206 lb (93.4 kg)  08/01/17 208 lb (94.3 kg)  07/07/17 209 lb (94.8 kg)  Psych/Mental status: Alert, oriented x 3 (person, place, & time)       Eyes: PERLA Respiratory: No evidence of acute respiratory distress  Cervical Spine Area Exam  Skin & Axial Inspection: No masses, redness, edema, swelling, or associated skin lesions Alignment: Symmetrical Functional ROM: Unrestricted ROM      Stability: No instability detected Muscle Tone/Strength: Functionally intact. No obvious neuro-muscular anomalies detected. Sensory (Neurological): Unimpaired Palpation: No palpable anomalies              Upper Extremity (UE) Exam    Side: Right upper extremity  Side: Left upper extremity  Skin & Extremity Inspection: Skin color, temperature, and hair growth are WNL. No peripheral edema or cyanosis. No masses, redness, swelling, asymmetry, or associated skin lesions. No contractures.  Skin & Extremity Inspection: Skin color, temperature, and hair growth are WNL. No peripheral edema or cyanosis. No masses, redness, swelling, asymmetry, or associated skin lesions. No contractures.  Functional ROM: Unrestricted ROM          Functional ROM: Unrestricted ROM            Muscle Tone/Strength: Functionally intact. No obvious neuro-muscular anomalies detected.  Muscle Tone/Strength: Functionally intact. No obvious neuro-muscular anomalies detected.  Sensory (Neurological): Unimpaired          Sensory (Neurological): Unimpaired          Palpation: No palpable anomalies              Palpation: No palpable anomalies              Provocative Test(s):  Phalen's test: deferred Tinel's test: deferred Apley scratch test (Rotator Cuff ROM): deferred  Provocative Test(s):  Phalen's test: deferred Tinel's test:  deferred Apley scratch test (Rotator Cuff ROM): deferred   Thoracic Spine Area Exam  Skin & Axial Inspection: No masses, redness, or swelling Alignment: Symmetrical Functional ROM: Unrestricted ROM Stability: No instability detected Muscle Tone/Strength: Functionally intact. No obvious neuro-muscular anomalies detected. Sensory (Neurological): Unimpaired Muscle strength & Tone: No palpable anomalies  Lumbar Spine Area Exam  Skin & Axial Inspection: No masses, redness, or swelling Alignment: Symmetrical Functional ROM: Unrestricted ROM       Stability: No instability detected Muscle Tone/Strength: Functionally intact. No obvious neuro-muscular anomalies detected. Sensory (Neurological): Unimpaired Palpation: No palpable anomalies       Provocative Tests: Lumbar Hyperextension/rotation test: deferred today       Lumbar quadrant test (Kemp's test): deferred today       Lumbar Lateral bending test: deferred today       Patrick's Maneuver: deferred today                   FABER test: deferred today       Thigh-thrust test: deferred today       S-I compression test: deferred today       S-I distraction test: deferred today        Gait & Posture Assessment  Ambulation: Unassisted Gait: Relatively normal for age and body habitus Posture: WNL   Lower Extremity Exam    Side: Right lower extremity  Side: Left lower extremity  Stability: No instability observed          Stability: No instability observed          Skin & Extremity Inspection: Skin color, temperature, and hair growth are WNL. No peripheral edema or cyanosis. No masses, redness, swelling, asymmetry, or associated skin lesions. No contractures.  Skin & Extremity Inspection: Skin color, temperature, and hair growth are WNL. No peripheral edema or cyanosis. No masses, redness, swelling, asymmetry, or associated skin lesions. No contractures.  Functional ROM: Pain restricted ROM for ankle joint                  Functional ROM:  Pain restricted ROM for ankle joint                  Muscle Tone/Strength: Functionally intact. No obvious neuro-muscular anomalies detected.  Muscle Tone/Strength: Functionally intact. No obvious neuro-muscular anomalies detected.  Sensory (Neurological): Paresthesia (Burning sensation)  Sensory (Neurological): Paresthesia (Burning sensation)  Palpation: No palpable anomalies  Palpation: No palpable anomalies   Assessment  Primary Diagnosis & Pertinent Problem List: The primary encounter diagnosis was Diabetic polyneuropathy associated with type 2 diabetes mellitus ([name]). Diagnoses of Neuropathy, Seropositive rheumatoid arthritis ([name]), CKD stage 3 due to type 2 diabetes mellitus ([name]), and Chronic pain syndrome were also pertinent to this visit.  Status Diagnosis  Persistent Persistent Persistent 1. Diabetic polyneuropathy associated with type 2 diabetes mellitus ([name])   2. Neuropathy   3. Seropositive   rheumatoid arthritis ([name])   4. CKD stage 3 due to type 2 diabetes mellitus ([name])   5. Chronic pain syndrome      General Recommendations: The pain condition that the patient suffers from is best treated with a multidisciplinary approach that involves an increase in physical activity to prevent de-conditioning and worsening of the pain cycle, as well as psychological counseling (formal and/or informal) to address the co-morbid psychological affects of pain. Treatment will often involve judicious use of pain medications and interventional procedures to decrease the pain, allowing the patient to participate in the physical activity that will ultimately produce long-lasting pain reductions. The goal of the multidisciplinary approach is to return the patient to a higher level of overall function and to restore their ability to perform activities of daily living.  86-year-old male with a history of coronary artery disease, GERD, interstitial lung disease, rheumatoid arthritis, aortic stenosis,  chronic kidney disease stage III who presents with lower extremity neuropathic pain secondary to diabetic polyneuropathy. Patient's diagnosis of diabetes was over 10-15 years ago. He is not on insulin. He describes burning and tingling sensation in his toes. This is worsened over time. This is progressed to the dorsum of his foot. Patient has been tried on various medications including desipramine, lidocaine cream, gabapentin at a dose of 900 mill grams in the morning, 600 mg in the afternoon, 600 mg at bedtime. This dose had to be reduced secondary to his chronic kidney disease. Patient was also tried on Lyrica 100 mg twice daily but it was too expensive for the patient to afford. Furthermore Lyrica resulted in leg swelling and further dose escalation was limited by patient's chronic kidney disease stage III. It was not very effective for his neuropathic pain symptoms. Patient also has tried carbamazepine which was not effective for his neuropathic pain. Patient has also had injections in his foot for his pain symptoms which were not effective.   Today, we will increase Hydrocodone from 10 mg TID prn to 10 mg QID prn since patient obtaining some (but not significant) benefit with HC 10 mg TID prn. He feels that he needs extra dose at night on some evenings due to significant burning ankle and foot pain.   Plan of Care  Pharmacotherapy (Medications Ordered): Meds ordered this encounter  Medications  . HYDROcodone-acetaminophen (NORCO) 10-325 MG tablet    Sig: Take 1-2 tablets by mouth every 6 (six) hours as needed.    Dispense:  120 tablet    Refill:  0    For chronic pain 1-2 tablets q6 hrs prn, not to exceed 4 tablets a day To last for 30 days from fill date   Time Note: Greater than 50% of the 25 minute(s) of face-to-face time spent with [name], was spent in counseling/coordination of care regarding: [name]'s primary cause of pain, the treatment plan, medication side effects,  the opioid analgesic risks and possible complications, the appropriate use of his medications, the medication agreement and the patient's responsibilities when it comes to controlled substances.  Provider-requested follow-up: Return in about 3 weeks (around 09/15/2017) for Medication Management.  Future Appointments  Date Time Provider Department Center  09/01/2017 10:40 AM Dale, Taylor M, PA-C [facility] None  09/12/2017 11:30 AM , , MD [facility] None  10/17/2017 11:00 AM [name], Richard I, MD [facility]  01/10/2018 11:30 AM [facility] 2 [id number]    Primary Care Physician: [name], Richard I, MD Location: [facility] Note by:   Gillis Santa, M.D Date: 08/22/2017; Time: 2:42 PM  There are no Patient Instructions on file for this visit.

## 2017-08-22 NOTE — Progress Notes (Signed)
Nursing Pain Medication Assessment:  Safety precautions to be maintained throughout the outpatient stay will include: orient to surroundings, keep bed in low position, maintain call bell within reach at all times, provide assistance with transfer out of bed and ambulation.  Medication Inspection Compliance: Pill count conducted under aseptic conditions, in front of the patient. Neither the pills nor the bottle was removed from the patient's sight at any time. Once count was completed pills were immediately returned to the patient in their original bottle.  Medication: Hydrocodone/APAP Pill/Patch Count: 0 of 90 pills remain Pill/Patch Appearance: Markings consistent with prescribed medication Bottle Appearance: Standard pharmacy container. Clearly labeled. Filled Date: 4 / 75 / 2019 Last Medication intake:  08/17/17

## 2017-08-23 ENCOUNTER — Telehealth: Payer: Self-pay

## 2017-08-23 NOTE — Telephone Encounter (Signed)
   [facility] Pre-operative Risk Assessment    Request for surgical clearance:  1. What type of surgery is being performed? Teeth Cleaning and Extraction   2. When is this surgery scheduled? TBD   3. What type of clearance is required (medical clearance vs. Pharmacy clearance to hold med vs. Both)? Pharmacy  4. Are there any medications that need to be held prior to surgery and how long?Plavix   5. Practice name and name of physician performing surgery? [facility]/ Dr Festus Aloe   6. What is your office phone [phone]    7.   What is your office fax [phone]  8.   Anesthesia type (None, local, MAC, general) ? Local   [name] 08/23/2017, 12:40 PM  _________________________________________________________________   (provider comments below)

## 2017-08-24 NOTE — Telephone Encounter (Signed)
Ok to hold plavix 5 days before colonoscopy

## 2017-08-24 NOTE — Telephone Encounter (Signed)
   Primary Cardiologist: [name], MD  Chart reviewed as part of pre-operative protocol coverage. Given past medical history and time since last visit, based on ACC/AHA guidelines, FREELAND PRACHT would be at acceptable risk for the planned procedure without further cardiovascular testing. Recently hold plavix for colonoscopy without any issue.   Dr. Johnsie Cancel, is it okay to hold Plavix and how long? Please forward your response to [facility] PREOP.   I will route this recommendation to the requesting party via Epic fax function and remove from pre-op pool.  Please call with questions.  Philip, [place] 08/24/2017, 4:13 PM

## 2017-09-01 ENCOUNTER — Ambulatory Visit (INDEPENDENT_AMBULATORY_CARE_PROVIDER_SITE_OTHER): Payer: Medicare Other | Admitting: Rheumatology

## 2017-09-01 ENCOUNTER — Encounter: Payer: Self-pay | Admitting: Physician Assistant

## 2017-09-01 ENCOUNTER — Encounter: Payer: Medicare Other | Admitting: Student in an Organized Health Care Education/Training Program

## 2017-09-01 VITALS — BP 171/81 | HR 62 | Ht 73.0 in | Wt 206.0 lb

## 2017-09-01 DIAGNOSIS — M47816 Spondylosis without myelopathy or radiculopathy, lumbar region: Secondary | ICD-10-CM

## 2017-09-01 DIAGNOSIS — Z862 Personal history of diseases of the blood and blood-forming organs and certain disorders involving the immune mechanism: Secondary | ICD-10-CM | POA: Diagnosis not present

## 2017-09-01 DIAGNOSIS — Z79899 Other long term (current) drug therapy: Secondary | ICD-10-CM | POA: Diagnosis not present

## 2017-09-01 DIAGNOSIS — Z96651 Presence of right artificial knee joint: Secondary | ICD-10-CM | POA: Diagnosis not present

## 2017-09-01 DIAGNOSIS — M1712 Unilateral primary osteoarthritis, left knee: Secondary | ICD-10-CM

## 2017-09-01 DIAGNOSIS — Z8679 Personal history of other diseases of the circulatory system: Secondary | ICD-10-CM

## 2017-09-01 DIAGNOSIS — Z8719 Personal history of other diseases of the digestive system: Secondary | ICD-10-CM | POA: Diagnosis not present

## 2017-09-01 DIAGNOSIS — Z8669 Personal history of other diseases of the nervous system and sense organs: Secondary | ICD-10-CM

## 2017-09-01 DIAGNOSIS — N184 Chronic kidney disease, stage 4 (severe): Secondary | ICD-10-CM | POA: Diagnosis not present

## 2017-09-01 DIAGNOSIS — Z8639 Personal history of other endocrine, nutritional and metabolic disease: Secondary | ICD-10-CM | POA: Diagnosis not present

## 2017-09-01 DIAGNOSIS — J849 Interstitial pulmonary disease, unspecified: Secondary | ICD-10-CM | POA: Diagnosis not present

## 2017-09-01 DIAGNOSIS — M059 Rheumatoid arthritis with rheumatoid factor, unspecified: Secondary | ICD-10-CM

## 2017-09-01 DIAGNOSIS — I5032 Chronic diastolic (congestive) heart failure: Secondary | ICD-10-CM

## 2017-09-01 MED ORDER — AZATHIOPRINE 50 MG PO TABS: ORAL_TABLET | ORAL | 0 refills | Status: DC

## 2017-09-01 NOTE — Patient Instructions (Addendum)
Azathioprine tablets What is this medicine? AZATHIOPRINE (ay za THYE oh preen) suppresses the immune system. It is used to prevent organ rejection after a transplant. It is also used to treat rheumatoid arthritis. This medicine may be used for other purposes; ask your health care provider or pharmacist if you have questions. COMMON BRAND NAME(S): Azasan, Imuran What should I tell my health care provider before I take this medicine? They need to know if you have any of these conditions: -infection -kidney disease -liver disease -an unusual or allergic reaction to azathioprine, other medicines, lactose, foods, dyes, or preservatives -pregnant or trying to get pregnant -breast feeding How should I use this medicine? Take this medicine by mouth with a full glass of water. Follow the directions on the prescription label. Take your medicine at regular intervals. Do not take your medicine more often than directed. Continue to take your medicine even if you feel better. Do not stop taking except on your doctor's advice. Talk to your pediatrician regarding the use of this medicine in children. Special care may be needed. Overdosage: If you think you have taken too much of this medicine contact a poison control center or emergency room at once. NOTE: This medicine is only for you. Do not share this medicine with others. What if I miss a dose? If you miss a dose, take it as soon as you can. If it is almost time for your next dose, take only that dose. Do not take double or extra doses. What may interact with this medicine? Do not take this medicine with any of the following medications: -febuxostat -mercaptopurine This medicine may also interact with the following medications: -allopurinol -aminosalicylates like sulfasalazine, mesalamine, balsalazide, and olsalazine -leflunomide -medicines called ACE inhibitors like benazepril, captopril, enalapril, fosinopril, quinapril, lisinopril, ramipril, and  trandolapril -mycophenolate -sulfamethoxazole; trimethoprim -vaccines -warfarin This list may not describe all possible interactions. Give your health care provider a list of all the medicines, herbs, non-prescription drugs, or dietary supplements you use. Also tell them if you smoke, drink alcohol, or use illegal drugs. Some items may interact with your medicine. What should I watch for while using this medicine? Visit your doctor or health care professional for regular checks on your progress. You will need frequent blood checks during the first few months you are receiving the medicine. If you get a cold or other infection while receiving this medicine, call your doctor or health care professional. Do not treat yourself. The medicine may increase your risk of getting an infection. Women should inform their doctor if they wish to become pregnant or think they might be pregnant. There is a potential for serious side effects to an unborn child. Talk to your health care professional or pharmacist for more information. Men may have a reduced sperm count while they are taking this medicine. Talk to your health care professional for more information. This medicine may increase your risk of getting certain kinds of cancer. Talk to your doctor about healthy lifestyle choices, important screenings, and your risk. What side effects may I notice from receiving this medicine? Side effects that you should report to your doctor or health care professional as soon as possible: -allergic reactions like skin rash, itching or hives, swelling of the face, lips, or tongue -changes in vision -confusion -fever, chills, or any other sign of infection -loss of balance or coordination -severe stomach pain -unusual bleeding, bruising -unusually weak or tired -vomiting -yellowing of the eyes or skin Side effects that usually do   not require medical attention (report to your doctor or health care professional if they  continue or are bothersome): -hair loss -nausea This list may not describe all possible side effects. Call your doctor for medical advice about side effects. You may report side effects to FDA at 1-800-FDA-1088. Where should I keep my medicine? Keep out of the reach of children. Store at room temperature between 15 and 25 degrees C (59 and 77 degrees F). Protect from light. Throw away any unused medicine after the expiration date. NOTE: This sheet is a summary. It may not cover all possible information. If you have questions about this medicine, talk to your doctor, pharmacist, or health care provider.  2018 [employer] Standard (2013-07-17 12:00:31) Standing Labs We placed an order today for your standing lab work.    Please come back and get your standing labs in 2 weeks x 2 and then every 3 months  We have open lab Monday through Friday from 8:30-11:30 AM and 1:30-4:00 PM  at the office of Dr. [name].   You may experience shorter wait times on Monday and Friday afternoons. The office is located at [address], [place], [place], [place] [address] No appointment is necessary.   Labs are drawn by [facility].  You may receive a bill from [place] for your lab work. If you have any questions regarding directions or hours of operation,  please call [phone].

## 2017-09-12 ENCOUNTER — Ambulatory Visit
Payer: Medicare Other | Attending: Student in an Organized Health Care Education/Training Program | Admitting: Student in an Organized Health Care Education/Training Program

## 2017-09-12 ENCOUNTER — Encounter: Payer: Self-pay | Admitting: Student in an Organized Health Care Education/Training Program

## 2017-09-12 VITALS — BP 160/90 | HR 75 | Temp 98.3°F | Resp 16 | Ht 73.0 in | Wt 204.0 lb

## 2017-09-12 DIAGNOSIS — G629 Polyneuropathy, unspecified: Secondary | ICD-10-CM

## 2017-09-12 DIAGNOSIS — M48061 Spinal stenosis, lumbar region without neurogenic claudication: Secondary | ICD-10-CM | POA: Insufficient documentation

## 2017-09-12 DIAGNOSIS — K5790 Diverticulosis of intestine, part unspecified, without perforation or abscess without bleeding: Secondary | ICD-10-CM | POA: Diagnosis not present

## 2017-09-12 DIAGNOSIS — N184 Chronic kidney disease, stage 4 (severe): Secondary | ICD-10-CM | POA: Diagnosis not present

## 2017-09-12 DIAGNOSIS — M79672 Pain in left foot: Secondary | ICD-10-CM | POA: Diagnosis not present

## 2017-09-12 DIAGNOSIS — Z7982 Long term (current) use of aspirin: Secondary | ICD-10-CM | POA: Insufficient documentation

## 2017-09-12 DIAGNOSIS — E1122 Type 2 diabetes mellitus with diabetic chronic kidney disease: Secondary | ICD-10-CM | POA: Diagnosis not present

## 2017-09-12 DIAGNOSIS — K219 Gastro-esophageal reflux disease without esophagitis: Secondary | ICD-10-CM | POA: Insufficient documentation

## 2017-09-12 DIAGNOSIS — I700 Atherosclerosis of aorta: Secondary | ICD-10-CM | POA: Insufficient documentation

## 2017-09-12 DIAGNOSIS — Z951 Presence of aortocoronary bypass graft: Secondary | ICD-10-CM | POA: Insufficient documentation

## 2017-09-12 DIAGNOSIS — N400 Enlarged prostate without lower urinary tract symptoms: Secondary | ICD-10-CM | POA: Diagnosis not present

## 2017-09-12 DIAGNOSIS — I130 Hypertensive heart and chronic kidney disease with heart failure and stage 1 through stage 4 chronic kidney disease, or unspecified chronic kidney disease: Secondary | ICD-10-CM | POA: Diagnosis not present

## 2017-09-12 DIAGNOSIS — E785 Hyperlipidemia, unspecified: Secondary | ICD-10-CM | POA: Diagnosis not present

## 2017-09-12 DIAGNOSIS — M545 Low back pain: Secondary | ICD-10-CM | POA: Diagnosis not present

## 2017-09-12 DIAGNOSIS — N183 Chronic kidney disease, stage 3 unspecified: Secondary | ICD-10-CM

## 2017-09-12 DIAGNOSIS — K222 Esophageal obstruction: Secondary | ICD-10-CM | POA: Diagnosis not present

## 2017-09-12 DIAGNOSIS — Z79899 Other long term (current) drug therapy: Secondary | ICD-10-CM | POA: Insufficient documentation

## 2017-09-12 DIAGNOSIS — Z7984 Long term (current) use of oral hypoglycemic drugs: Secondary | ICD-10-CM | POA: Diagnosis not present

## 2017-09-12 DIAGNOSIS — G894 Chronic pain syndrome: Secondary | ICD-10-CM | POA: Insufficient documentation

## 2017-09-12 DIAGNOSIS — J849 Interstitial pulmonary disease, unspecified: Secondary | ICD-10-CM | POA: Diagnosis not present

## 2017-09-12 DIAGNOSIS — M059 Rheumatoid arthritis with rheumatoid factor, unspecified: Secondary | ICD-10-CM

## 2017-09-12 DIAGNOSIS — I25119 Atherosclerotic heart disease of native coronary artery with unspecified angina pectoris: Secondary | ICD-10-CM | POA: Diagnosis not present

## 2017-09-12 DIAGNOSIS — M069 Rheumatoid arthritis, unspecified: Secondary | ICD-10-CM | POA: Diagnosis not present

## 2017-09-12 DIAGNOSIS — E1142 Type 2 diabetes mellitus with diabetic polyneuropathy: Secondary | ICD-10-CM | POA: Diagnosis not present

## 2017-09-12 DIAGNOSIS — M79671 Pain in right foot: Secondary | ICD-10-CM | POA: Insufficient documentation

## 2017-09-12 DIAGNOSIS — G4733 Obstructive sleep apnea (adult) (pediatric): Secondary | ICD-10-CM | POA: Insufficient documentation

## 2017-09-12 DIAGNOSIS — Z955 Presence of coronary angioplasty implant and graft: Secondary | ICD-10-CM | POA: Insufficient documentation

## 2017-09-12 DIAGNOSIS — M47816 Spondylosis without myelopathy or radiculopathy, lumbar region: Secondary | ICD-10-CM | POA: Insufficient documentation

## 2017-09-12 DIAGNOSIS — Z87891 Personal history of nicotine dependence: Secondary | ICD-10-CM | POA: Insufficient documentation

## 2017-09-12 DIAGNOSIS — J9610 Chronic respiratory failure, unspecified whether with hypoxia or hypercapnia: Secondary | ICD-10-CM | POA: Insufficient documentation

## 2017-09-12 DIAGNOSIS — M170 Bilateral primary osteoarthritis of knee: Secondary | ICD-10-CM | POA: Insufficient documentation

## 2017-09-12 MED ORDER — HYDROCODONE-ACETAMINOPHEN 10-325 MG PO TABS: 1.0000 | ORAL_TABLET | ORAL | 0 refills | Status: DC | PRN

## 2017-09-12 NOTE — Progress Notes (Signed)
Patient's Name: [name]  MRN: [id number]  Referring Provider: [name], MD  DOB: [birth year]  PCP: [name], MD  DOS: 09/12/2017  Note by: Gillis Santa, MD  Service setting: Ambulatory outpatient  Specialty: Interventional Pain Management  Location: [facility] (AMB) Pain Management Facility    Patient type: Established   Primary Reason(s) for Visit: Encounter for prescription drug management. (Level of risk: moderate)  CC: Foot Pain (bilateral)  HPI  [name] is a 84 y.o. year old, male patient, who comes today for a medication management evaluation. He has HLD (hyperlipidemia); Obstructive sleep apnea; Essential hypertension; Thoracic aorta atherosclerosis ([place]); Reflux esophagitis; ESOPHAGEAL STRICTURE; GERD; DIVERTICULOSIS-COLON; BENIGN PROSTATIC HYPERTROPHY; ACTINIC KERATOSIS; SLEEP DISORDER, CHRONIC; Nausea; Chronic diastolic heart failure ([name]); ILD (interstitial lung disease) ([place]); CKD (chronic kidney disease) stage 4, GFR 15-29 ml/min ([place]); Routine general medical examination at a health care facility; Seropositive rheumatoid arthritis ([place]); Ventral hernia; DM (diabetes mellitus) type II controlled, neurological manifestation ([place]); Atherosclerotic heart disease of native coronary artery with angina pectoris ([place]); Respiratory failure, chronic ([place]); Spinal stenosis of lumbar region without neurogenic claudication; Spondylosis without myelopathy or radiculopathy, lumbar region; Tegretol-induced dizziness; Diarrhea; Orthostatic hypotension; High risk medication use; Primary osteoarthritis of both knees; History of right knee joint replacement; Dysphagia; Esophageal stricture; and Cerumen impaction on their problem list. His primarily concern today is the Foot Pain (bilateral)  Pain Assessment: Location: Left, Right Foot Radiating: up to the ankles  Onset: More than a month ago Duration: Chronic pain Quality: Discomfort, Aching, Numbness, Throbbing Severity: 7 /10  (subjective, self-reported pain score)  Note: Reported level is inconsistent with clinical observations. Clinically the patient looks like a 3/10 A 3/10 is viewed as "Moderate" and described as significantly interfering with activities of daily living (ADL). It becomes difficult to feed, bathe, get dressed, get on and off the toilet or to perform personal hygiene functions. Difficult to get in and out of bed or a chair without assistance. Very distracting. With effort, it can be ignored when deeply involved in activities. Information on the proper use of the pain scale provided to the patient today. When using our objective Pain Scale, levels between 6 and 10/10 are said to belong in an emergency room, as it progressively worsens from a 6/10, described as severely limiting, requiring emergency care not usually available at an outpatient pain management facility. At a 6/10 level, communication becomes difficult and requires great effort. Assistance to reach the emergency department may be required. Facial flushing and profuse sweating along with potentially dangerous increases in heart rate and blood pressure will be evident. Effect on ADL: denies that it affects his activity, still continues to do what he needs to  Timing: Constant Modifying factors: nothing is currently helping the pain.  pain medication eases it off but does not take it completely away BP: (!) 160/90  HR: 75  [name] was last scheduled for an appointment on 08/22/2017 for medication management. During today's appointment we reviewed [name] chronic pain status, as well as his outpatient medication regimen.  The patient  reports that he does not use drugs. His body mass index is 26.91 kg/m.  Further details on both, my assessment(s), as well as the proposed treatment plan, please see below.  Controlled Substance Pharmacotherapy Assessment REMS (Risk Evaluation and Mitigation Strategy)  Analgesic: Hydrocodone 10 mg, 4 times  daily as needed, quantity 120/month MME/day: 40 mg/day.  Janett Billow, RN  09/12/2017 11:39 AM  Sign at close  encounter Nursing Pain Medication Assessment:  Safety precautions to be maintained throughout the outpatient stay will include: orient to surroundings, keep bed in low position, maintain call bell within reach at all times, provide assistance with transfer out of bed and ambulation.  Medication Inspection Compliance: Pill count conducted under aseptic conditions, in front of the patient. Neither the pills nor the bottle was removed from the patient's sight at any time. Once count was completed pills were immediately returned to the patient in their original bottle.  Medication: Hydrocodone/APAP Pill/Patch Count: 53 of 120 pills remain Pill/Patch Appearance: Markings consistent with prescribed medication Bottle Appearance: Standard pharmacy container. Clearly labeled. Filled Date: 05 / 20 / 2019 Last Medication intake:  Today   Pharmacokinetics: Liberation and absorption (onset of action): WNL Distribution (time to peak effect): WNL Metabolism and excretion (duration of action): WNL         Pharmacodynamics: Desired effects: Analgesia: [name] reports >50% benefit. Functional ability: Patient reports that medication allows him to accomplish basic ADLs Clinically meaningful improvement in function (CMIF): Sustained CMIF goals met Perceived effectiveness: Described as relatively effective but with some room for improvement Undesirable effects: Side-effects or Adverse reactions: None reported Monitoring: [name] PMP: Online review of the past 51-monthperiod conducted. Compliant with practice rules and regulations Last UDS on record: Summary  Date Value Ref Range Status  03/17/2017 FINAL  Final    Comment:    ==================================================================== TOXASSURE COMP DRUG  ANALYSIS,UR ==================================================================== Test                             Result       Flag       Units Drug Absent but Declared for Prescription Verification   Tramadol                       Not Detected UNEXPECTED ng/mg creat   Salicylate                     Not Detected UNEXPECTED    Aspirin, as indicated in the declared medication list, is not    always detected even when used as directed. ==================================================================== Test                      Result    Flag   Units      Ref Range   Creatinine              80               mg/dL      >=20 ==================================================================== Declared Medications:  The flagging and interpretation on this report are based on the  following declared medications.  Unexpected results may arise from  inaccuracies in the declared medications.  **Note: The testing scope of this panel includes these medications:  Tramadol  **Note: The testing scope of this panel does not include small to  moderate amounts of these reported medications:  Aspirin (Aspirin 81)  **Note: The testing scope of this panel does not include following  reported medications:  Carvedilol  Cephalexin  Clopidogrel  Furosemide  Metformin  Multivitamin  Mycophenolate mofetil (Cellcept)  Nitroglycerin  Ondansetron  Oxygen  Pantoprazole  Sodium Chloride  Sulfamethoxazole  Trimethoprim ==================================================================== For clinical consultation, please call [phone] ====================================================================    UDS interpretation: Compliant          Medication Assessment Form: Reviewed.  Patient indicates being compliant with therapy Treatment compliance: Compliant Risk Assessment Profile: Aberrant behavior: See prior evaluations. None observed or detected today Comorbid factors increasing risk of  overdose: See prior notes. No additional risks detected today Risk of substance use disorder (SUD): Low Opioid Risk Tool - 09/12/17 1144      Family History of Substance Abuse   Alcohol  Negative    Illegal Drugs  Negative    Rx Drugs  Negative      Personal History of Substance Abuse   Alcohol  Negative    Illegal Drugs  Negative    Rx Drugs  Negative      Psychological Disease   Psychological Disease  Negative    Depression  Negative      Total Score   Opioid Risk Tool Scoring  0    Opioid Risk Interpretation  Low Risk      ORT Scoring interpretation table:  Score <3 = Low Risk for SUD  Score between 4-7 = Moderate Risk for SUD  Score >8 = High Risk for Opioid Abuse   Risk Mitigation Strategies:  Patient Counseling: Covered Patient-Prescriber Agreement (PPA): Present and active  Notification to other healthcare providers: Done  Pharmacologic Plan: Therapy adjustment: Increase hydrocodone so that the patient can take 1 to 2 tablets up to 3 times a day as needed for moderate to severe pain.  Maximum of 6 tablets/day.  Not to exceed 180 tablets/month.             Laboratory Chemistry  Inflammation Markers (CRP: Acute Phase) (ESR: Chronic Phase) Lab Results  Component Value Date   ESRSEDRATE 38 (H) 11/22/2011                         Rheumatology Markers Lab Results  Component Value Date   RF 23 (H) 11/22/2011   ANA NEG 11/22/2011                        Renal Function Markers Lab Results  Component Value Date   BUN 30 (H) 06/24/2017   CREATININE 2.28 (H) 06/24/2017   BCR 13 06/24/2017   GFRAA 30 (L) 06/24/2017   GFRNONAA 26 (L) 06/24/2017                             Hepatic Function Markers Lab Results  Component Value Date   AST 17 06/24/2017   ALT 10 06/24/2017   ALBUMIN 4.2 04/29/2017   ALKPHOS 67 04/29/2017                        Electrolytes Lab Results  Component Value Date   NA 142 06/24/2017   K 4.6 06/24/2017   CL 102 06/24/2017    CALCIUM 10.0 06/24/2017   MG 1.9 04/08/2017   PHOS 3.6 04/08/2016                        Neuropathy Markers Lab Results  Component Value Date   VITAMINB12 253 08/10/2016   FOLATE 17.6 08/10/2016   HGBA1C 8.0 (H) 04/18/2017                        Bone Pathology Markers No results found for: [place], [id number], [id number], [id number], [place], 25OHVITD2, 25OHVITD3, TESTOFREE, TESTOSTERONE  Coagulation Parameters Lab Results  Component Value Date   INR 1.12 08/13/2015   LABPROT 14.6 08/13/2015   APTT 29 08/13/2015   PLT 201 06/24/2017                        Cardiovascular Markers Lab Results  Component Value Date   BNP 318.4 (H) 08/13/2015   CKTOTAL 73 12/31/2011   CKMB 3.4 12/31/2011   TROPONINI 0.68 (HH) 08/14/2015   HGB 12.0 (L) 06/24/2017   HCT 38.2 (L) 06/24/2017                         CA Markers No results found for: CEA, CA125, LABCA2                      Note: Lab results reviewed.  Recent Diagnostic Imaging Results  Cardiac event monitor SR isolated PAC;s and PVC;s  No significant arrhythmias  Complexity Note: Imaging results reviewed. Results shared with [name], using Layman's terms.                         Meds   Current Outpatient Medications:  .  AMITIZA 8 MCG capsule, , Disp: , Rfl:  .  aspirin EC 81 MG tablet, Take 81 mg by mouth at bedtime., Disp: , Rfl:  .  atorvastatin (LIPITOR) 80 MG tablet, , Disp: , Rfl:  .  azaTHIOprine (IMURAN) 50 MG tablet, Take 1 tablet daily for 2 weeks, if labs are stable increase to 1 tablet BID., Disp: 42 tablet, Rfl: 0 .  Blood Glucose Monitoring Suppl (ONE TOUCH ULTRA 2) w/Device KIT, Use to obtain blood sugar daily. Dx Code E11.40, Disp: 1 each, Rfl: 0 .  carvedilol (COREG) 3.125 MG tablet, Take 1 tablet (3.125 mg total) by mouth 2 (two) times daily with a meal., Disp: 180 tablet, Rfl: 3 .  cholecalciferol (VITAMIN D) 1000 units tablet, Take 2,000 Units by mouth daily. , Disp: , Rfl:   .  clopidogrel (PLAVIX) 75 MG tablet, TAKE 1 TABLET BY MOUTH DAILY WITH BREAKFAST., Disp: 30 tablet, Rfl: 10 .  DHA-Vitamin C-Lutein (EYE HEALTH FORMULA PO), Take 1 tablet by mouth daily. , Disp: , Rfl:  .  ferrous sulfate 324 (65 Fe) MG TBEC, Take 1 tablet by mouth daily., Disp: , Rfl:  .  furosemide (LASIX) 40 MG tablet, TAKE 1 TO 2 TABLETS BY MOUTH EVERY DAY, Disp: 60 tablet, Rfl: 11 .  glucose blood (ONE TOUCH ULTRA TEST) test strip, USE TO CHECK BLOOD SUGAR ONCE A DAY Dx Code E11.40, Disp: 100 each, Rfl: 3 .  HYDROcodone-acetaminophen (NORCO) 10-325 MG tablet, Take 1-2 tablets by mouth every 4 (four) hours as needed., Disp: 180 tablet, Rfl: 0 .  metFORMIN (GLUCOPHAGE) 1000 MG tablet, TAKE 1 TABLET BY MOUTH ONCE DAILY WITH BREAKFAST, Disp: 30 tablet, Rfl: 11 .  nitroGLYCERIN (NITROSTAT) 0.4 MG SL tablet, DISSOLVE 1 TABLET UNDER THE TONGUE FOR CHEST PAIN. MAY REPEAT EVERY 5MINUTES UP TO 3 DOSES. IF NO RELIEF, CALL 911**, Disp: 25 tablet, Rfl: 5 .  ondansetron (ZOFRAN) 4 MG tablet, TAKE 1 TABLET BY MOUTH EVERY 8 HOURS AS NEEDED FOR NAUSEA OR VOMITING, Disp: 60 tablet, Rfl: 0 .  ONETOUCH DELICA LANCETS 17C MISC, 1 each by In Vitro route daily. Dx Code E11.49, Disp: 100 each, Rfl: 3 .  OXYGEN, Inhale into the lungs. Per pt- Uses Oxygen 2  liters at night., Disp: , Rfl:  .  pantoprazole (PROTONIX) 40 MG tablet, TAKE 1 TABLET BY MOUTH TWICE (2) DAILY, Disp: 60 tablet, Rfl: 11  ROS  Constitutional: Denies any fever or chills Gastrointestinal: No reported hemesis, hematochezia, vomiting, or acute GI distress Musculoskeletal: Denies any acute onset joint swelling, redness, loss of ROM, or weakness Neurological: No reported episodes of acute onset apraxia, aphasia, dysarthria, agnosia, amnesia, paralysis, loss of coordination, or loss of consciousness  Allergies  [name] is allergic to doxazosin mesylate and methocarbamol.  [place]  Drug: [name]  reports that he does not use drugs. Alcohol:   reports that he does not drink alcohol. Tobacco:  reports that he quit smoking about 54 years ago. His smoking use included cigarettes. He has a 20.00 pack-year smoking history. He has never used smokeless tobacco. Medical:  has a past medical history of Arthritis, CAD (coronary artery disease), Chronic diastolic CHF (congestive heart failure) ([place]), Chronic kidney disease, stage III (moderate) ([place]), Chronic lower back pain, Colon polyps, Diverticulosis, Dyspnea (2009 since July -Sept), Enlarged prostate, Esophageal stricture, GERD (gastroesophageal reflux disease), Heart murmur, Hiatal hernia, History of kidney stones, History of PFTs, Hyperlipidemia, Hypertension, Interstitial lung disease ([place]), Nausea & vomiting, Overweight (BMI 25.0-29.9), RA (rheumatoid arthritis) ([place]), Seropositive rheumatoid arthritis ([place]), and Type II diabetes mellitus ([place]). Surgical: [name]  has a past surgical history that includes Total knee arthroplasty (Right, 03/2010); Knee arthroscopy (Right, 2008); Cataract extraction w/ intraocular lens  implant, bilateral (Bilateral); Coronary artery bypass graft (11/1999); Coronary stent placement (02/2012); Shoulder arthroscopy with open rotator cuff repair and distal clavicle acrominectomy (Left, 02/27/2013); Trigger finger release (Left, 02/27/2013); Cholecystectomy open (11/2003); Joint replacement; Esophagogastroduodenoscopy (egd) with esophageal dilation (2010); Cardiac catheterization (08/2004); Cardiac catheterization (12/31/2011); Coronary angioplasty with stent (01/03/2012); Coronary angioplasty with stent (01/14/2014); Cardiac catheterization (2009); left and right heart catheterization with coronary angiogram (12/31/2011); percutaneous coronary stent intervention (pci-s) (12/31/2011); percutaneous coronary stent intervention (pci-s) (N/A, 01/03/2012); Percutaneous coronary intervention-balloon only (01/03/2012); left and right heart catheterization with coronary angiogram (N/A,  01/14/2014); Hand surgery; Cardiac catheterization (N/A, 08/13/2015); Esophagogastroduodenoscopy (N/A, 03/01/2017); and maloney dilation (03/01/2017). Family: family history includes Alcohol abuse in his sister; COPD in his mother; Colon cancer (age of onset: 49) in his brother; Diabetes in his brother; Heart attack in his father; Heart disease in his father; Stroke in his sister.  Constitutional Exam  General appearance: Well nourished, well developed, and well hydrated. In no apparent acute distress Vitals:   09/12/17 1138  BP: (!) 160/90  Pulse: 75  Resp: 16  Temp: 98.3 F (36.8 C)  TempSrc: Oral  SpO2: 97%  Weight: 204 lb (92.5 kg)  Height: 6' 1" (1.854 m)   BMI Assessment: Estimated body mass index is 26.91 kg/m as calculated from the following:   Height as of this encounter: 6' 1" (1.854 m).   Weight as of this encounter: 204 lb (92.5 kg).  BMI interpretation table: BMI level Category Range association with higher incidence of chronic pain  <18 kg/m2 Underweight   18.5-24.9 kg/m2 Ideal body weight   25-29.9 kg/m2 Overweight Increased incidence by 20%  30-34.9 kg/m2 Obese (Class I) Increased incidence by 68%  35-39.9 kg/m2 Severe obesity (Class II) Increased incidence by 136%  >40 kg/m2 Extreme obesity (Class III) Increased incidence by 254%   Patient's current BMI Ideal Body weight  Body mass index is 26.91 kg/m. Ideal body weight: 79.9 kg (176 lb 2.4 oz) Adjusted ideal body weight: 85 kg (187 lb 4.6  oz)   BMI Readings from Last 4 Encounters:  09/12/17 26.91 kg/m  09/01/17 27.18 kg/m  08/22/17 27.18 kg/m  08/16/17 27.18 kg/m   Wt Readings from Last 4 Encounters:  09/12/17 204 lb (92.5 kg)  09/01/17 206 lb (93.4 kg)  08/22/17 206 lb (93.4 kg)  08/16/17 206 lb (93.4 kg)  Psych/Mental status: Alert, oriented x 3 (person, place, & time)       Eyes: PERLA Respiratory: No evidence of acute respiratory distress  Cervical Spine Area Exam  Skin & Axial Inspection:  No masses, redness, edema, swelling, or associated skin lesions Alignment: Symmetrical Functional ROM: Unrestricted ROM      Stability: No instability detected Muscle Tone/Strength: Functionally intact. No obvious neuro-muscular anomalies detected. Sensory (Neurological): Unimpaired Palpation: No palpable anomalies              Upper Extremity (UE) Exam    Side: Right upper extremity  Side: Left upper extremity  Skin & Extremity Inspection: Skin color, temperature, and hair growth are WNL. No peripheral edema or cyanosis. No masses, redness, swelling, asymmetry, or associated skin lesions. No contractures.  Skin & Extremity Inspection: Skin color, temperature, and hair growth are WNL. No peripheral edema or cyanosis. No masses, redness, swelling, asymmetry, or associated skin lesions. No contractures.  Functional ROM: Unrestricted ROM          Functional ROM: Unrestricted ROM          Muscle Tone/Strength: Functionally intact. No obvious neuro-muscular anomalies detected.  Muscle Tone/Strength: Functionally intact. No obvious neuro-muscular anomalies detected.  Sensory (Neurological): Unimpaired          Sensory (Neurological): Unimpaired          Palpation: No palpable anomalies              Palpation: No palpable anomalies              Provocative Test(s):  Phalen's test: deferred Tinel's test: deferred Apley's scratch test (touch opposite shoulder):  Action 1 (Across chest): deferred Action 2 (Overhead): deferred Action 3 (LB reach): deferred   Provocative Test(s):  Phalen's test: deferred Tinel's test: deferred Apley's scratch test (touch opposite shoulder):  Action 1 (Across chest): deferred Action 2 (Overhead): deferred Action 3 (LB reach): deferred    Thoracic Spine Area Exam  Skin & Axial Inspection: No masses, redness, or swelling Alignment: Symmetrical Functional ROM: Unrestricted ROM Stability: No instability detected Muscle Tone/Strength: Functionally intact. No  obvious neuro-muscular anomalies detected. Sensory (Neurological): Unimpaired Muscle strength & Tone: No palpable anomalies  Lumbar Spine Area Exam  Skin & Axial Inspection: No masses, redness, or swelling Alignment: Symmetrical Functional ROM: Unrestricted ROM       Stability: No instability detected Muscle Tone/Strength: Functionally intact. No obvious neuro-muscular anomalies detected. Sensory (Neurological): Unimpaired Palpation: No palpable anomalies       Provocative Tests: Lumbar Hyperextension/rotation test: deferred today       Lumbar quadrant test (Kemp's test): deferred today       Lumbar Lateral bending test: deferred today       Patrick's Maneuver: deferred today                   FABER test: deferred today       Thigh-thrust test: deferred today       S-I compression test: deferred today       S-I distraction test: deferred today        Gait & Posture Assessment  Ambulation: Unassisted Gait: Relatively  normal for age and body habitus Posture: WNL   Lower Extremity Exam    Side: Right lower extremity  Side: Left lower extremity  Stability: No instability observed          Stability: No instability observed          Skin & Extremity Inspection: Skin color, temperature, and hair growth are WNL. No peripheral edema or cyanosis. No masses, redness, swelling, asymmetry, or associated skin lesions. No contractures.  Skin & Extremity Inspection: Skin color, temperature, and hair growth are WNL. No peripheral edema or cyanosis. No masses, redness, swelling, asymmetry, or associated skin lesions. No contractures.  Functional ROM: Unrestricted ROM                  Functional ROM: Unrestricted ROM                  Muscle Tone/Strength: Functionally intact. No obvious neuro-muscular anomalies detected.  Muscle Tone/Strength: Functionally intact. No obvious neuro-muscular anomalies detected.  Sensory (Neurological): Neuropathic pain pattern  Sensory (Neurological): Neuropathic pain  pattern  Palpation: No palpable anomalies  Palpation: No palpable anomalies   Assessment  Primary Diagnosis & Pertinent Problem List: The primary encounter diagnosis was Diabetic polyneuropathy associated with type 2 diabetes mellitus ([place]). Diagnoses of Neuropathy, Seropositive rheumatoid arthritis ([place]), CKD stage 3 due to type 2 diabetes mellitus ([place]), and Chronic pain syndrome were also pertinent to this visit.  Status Diagnosis  Persistent Persistent Persistent 1. Diabetic polyneuropathy associated with type 2 diabetes mellitus ([place])   2. Neuropathy   3. Seropositive rheumatoid arthritis ([place])   4. CKD stage 3 due to type 2 diabetes mellitus ([place])   5. Chronic pain syndrome      General Recommendations: The pain condition that the patient suffers from is best treated with a multidisciplinary approach that involves an increase in physical activity to prevent de-conditioning and worsening of the pain cycle, as well as psychological counseling (formal and/or informal) to address the co-morbid psychological affects of pain. Treatment will often involve judicious use of pain medications and interventional procedures to decrease the pain, allowing the patient to participate in the physical activity that will ultimately produce long-lasting pain reductions. The goal of the multidisciplinary approach is to return the patient to a higher level of overall function and to restore their ability to perform activities of daily living.  84 year old male with a history of coronary artery disease, GERD, interstitial lung disease, rheumatoid arthritis, aortic stenosis, chronic kidney disease stage III who presents with lower extremity neuropathic pain secondary to diabetic polyneuropathy. Patient's diagnosis of diabetes was over 10-15 years ago. He is not on insulin. He describes burning and tingling sensation in his toes. This is worsened over time. This is progressed to the dorsum of his foot.  Patient has been tried on various medications including desipramine, lidocaine cream, gabapentin at a dose of 900 mill grams in the morning, 600 mg in the afternoon, 600 mg at bedtime. This dose had to be reduced secondary to his chronic kidney disease. Patient was also tried on Lyrica 100 mg twice daily but it was too expensive for the patient to afford. Furthermore Lyrica resulted in leg swelling and further dose escalation was limited by patient's chronic kidney disease stage III. It was not very effective for his neuropathic pain symptoms. Patient also has tried carbamazepine which was not effective for his neuropathic pain. Patient has also had injections in his foot for his pain symptoms which were not effective.  Today, we will increase Hydrocodone from 10 mg QID prn to 10-20 mg TID as needed (maximum 6 tablets a day) prn since patient obtaining some (but not significant) benefit with current hydrocodone dose.    Plan of Care  Pharmacotherapy (Medications Ordered): Meds ordered this encounter  Medications  . HYDROcodone-acetaminophen (NORCO) 10-325 MG tablet    Sig: Take 1-2 tablets by mouth every 4 (four) hours as needed.    Dispense:  180 tablet    Refill:  0    For chronic pain 1-2 tablets q4 hrs prn, not to exceed 6 tablets a day To last for 30 days from fill date   Time Note:Greater than 50% of the 36mnute(s) of face-to-face time spent with MichealMcdonald, was spent in counseling/coordination of care regarding:MichealMcdonald's primary cause of pain, the treatment plan, medication side effects, the opioid analgesic risks and possible complications, the appropriate use ofhismedications, the medication agreement and the patient's responsibilities when it comes to controlled substances.  Provider-requested follow-up: Return in about 1 month (around 10/10/2017) for Medication Management.  Future Appointments  Date Time Provider [place] 10/10/2017 12:00 PM LGillis Santa MD  [facility] None  10/17/2017 11:00 AM LVenia Carbon MD [facility]  01/10/2018 11:30 AM [id number]    Primary Care Physician: LVenia Carbon MD [facility] Note by: BGillis Santa M.D Date: 09/12/2017; Time: 2:09 PM  There are no Patient Instructions on file for this visit.

## 2017-09-12 NOTE — Progress Notes (Signed)
Nursing Pain Medication Assessment:  Safety precautions to be maintained throughout the outpatient stay will include: orient to surroundings, keep bed in low position, maintain call bell within reach at all times, provide assistance with transfer out of bed and ambulation.  Medication Inspection Compliance: Pill count conducted under aseptic conditions, in front of the patient. Neither the pills nor the bottle was removed from the patient's sight at any time. Once count was completed pills were immediately returned to the patient in their original bottle.  Medication: Hydrocodone/APAP Pill/Patch Count: 53 of 120 pills remain Pill/Patch Appearance: Markings consistent with prescribed medication Bottle Appearance: Standard pharmacy container. Clearly labeled. Filled Date: 05 / 20 / 2019 Last Medication intake:  Today

## 2017-09-13 DIAGNOSIS — K3189 Other diseases of stomach and duodenum: Secondary | ICD-10-CM | POA: Diagnosis not present

## 2017-09-13 DIAGNOSIS — D509 Iron deficiency anemia, unspecified: Secondary | ICD-10-CM | POA: Diagnosis not present

## 2017-09-13 DIAGNOSIS — K31819 Angiodysplasia of stomach and duodenum without bleeding: Secondary | ICD-10-CM | POA: Diagnosis not present

## 2017-09-15 ENCOUNTER — Telehealth: Payer: Self-pay | Admitting: Rheumatology

## 2017-09-15 DIAGNOSIS — K3189 Other diseases of stomach and duodenum: Secondary | ICD-10-CM | POA: Diagnosis not present

## 2017-09-15 DIAGNOSIS — J8410 Pulmonary fibrosis, unspecified: Secondary | ICD-10-CM | POA: Diagnosis not present

## 2017-09-15 DIAGNOSIS — K31819 Angiodysplasia of stomach and duodenum without bleeding: Secondary | ICD-10-CM | POA: Diagnosis not present

## 2017-09-15 DIAGNOSIS — D509 Iron deficiency anemia, unspecified: Secondary | ICD-10-CM | POA: Diagnosis not present

## 2017-09-15 DIAGNOSIS — R0689 Other abnormalities of breathing: Secondary | ICD-10-CM | POA: Diagnosis not present

## 2017-09-15 NOTE — Telephone Encounter (Signed)
He may discontinue Imuran.  Is a schedule a follow-up appointment.  He may have to stay on low-dose prednisone as he could not tolerate any medications.

## 2017-09-15 NOTE — Telephone Encounter (Signed)
Patient advised he may discontinue the Imuran. Patient has been scheduled for a follow appointment and advised we may have to do lose dose prednisone as he can not tolerate any of the medications. Patient verbalized understanding.

## 2017-09-15 NOTE — Telephone Encounter (Signed)
Patient called stating he has been sick (throwing up) since he started taking the Azathioprine and he is going to stop taking it.  Patient is requesting a return call to discuss another medication.

## 2017-09-15 NOTE — Telephone Encounter (Signed)
Patient states he was started on Imuran on September 03, 2017. Patient states he has been having nausea and vomiting since he started the medication. Patient states he did not take the Imuran yesterday or today and has not had any nausea or vomiting.

## 2017-09-19 ENCOUNTER — Encounter: Payer: Self-pay | Admitting: Physician Assistant

## 2017-09-19 ENCOUNTER — Ambulatory Visit (INDEPENDENT_AMBULATORY_CARE_PROVIDER_SITE_OTHER): Payer: Medicare Other | Admitting: Rheumatology

## 2017-09-19 VITALS — BP 134/77 | HR 73 | Resp 16 | Ht 73.0 in | Wt 202.0 lb

## 2017-09-19 DIAGNOSIS — N184 Chronic kidney disease, stage 4 (severe): Secondary | ICD-10-CM

## 2017-09-19 DIAGNOSIS — Z79899 Other long term (current) drug therapy: Secondary | ICD-10-CM

## 2017-09-19 DIAGNOSIS — M1712 Unilateral primary osteoarthritis, left knee: Secondary | ICD-10-CM | POA: Diagnosis not present

## 2017-09-19 DIAGNOSIS — Z8719 Personal history of other diseases of the digestive system: Secondary | ICD-10-CM | POA: Diagnosis not present

## 2017-09-19 DIAGNOSIS — J849 Interstitial pulmonary disease, unspecified: Secondary | ICD-10-CM | POA: Diagnosis not present

## 2017-09-19 DIAGNOSIS — M059 Rheumatoid arthritis with rheumatoid factor, unspecified: Secondary | ICD-10-CM | POA: Diagnosis not present

## 2017-09-19 DIAGNOSIS — Z8669 Personal history of other diseases of the nervous system and sense organs: Secondary | ICD-10-CM | POA: Diagnosis not present

## 2017-09-19 DIAGNOSIS — Z8639 Personal history of other endocrine, nutritional and metabolic disease: Secondary | ICD-10-CM

## 2017-09-19 DIAGNOSIS — Z8679 Personal history of other diseases of the circulatory system: Secondary | ICD-10-CM | POA: Diagnosis not present

## 2017-09-19 DIAGNOSIS — Z96651 Presence of right artificial knee joint: Secondary | ICD-10-CM

## 2017-09-19 DIAGNOSIS — Z862 Personal history of diseases of the blood and blood-forming organs and certain disorders involving the immune mechanism: Secondary | ICD-10-CM

## 2017-09-19 DIAGNOSIS — I5032 Chronic diastolic (congestive) heart failure: Secondary | ICD-10-CM

## 2017-09-19 DIAGNOSIS — M47816 Spondylosis without myelopathy or radiculopathy, lumbar region: Secondary | ICD-10-CM | POA: Diagnosis not present

## 2017-09-19 NOTE — Progress Notes (Signed)
Office Visit Note  Patient: [name]             Date of Birth: [birth year]           MRN: [id number]             PCP: [name], MD Referring: [name], MD Visit Date: 09/19/2017 Occupation: @GUAROCC @    Subjective:  Discuss treatment options  History of Present Illness: [name] is a 84 y.o. male with history of seropositive rheumatoid arthritis, interstitial lung disease, osteoarthritis, and spondylosis of the lumbar spine.  He reports he discontinued Imuran on Thursday due to having nausea and vomiting since resuming Imuran.  He previously had to discontinue Cellcept due to anemia.  He reports his nausea improved yesterday but has returned today.  He has been taking Zofran 4 mg PRN for nausea and vomiting.  He denies any fevers or chills.  He denies any sick contacts.  He continues to have pain in bilateral hands and bilateral feet.  He denies any joint swelling.  He continues to chronic pain in bilateral knee joints.  He report the right knee replacement causes more discomfort than left knee joint pain.  He reports he saw Dr. Chase Caller on 08/01/17 who was ok with him starting on Imuran.  He reports he continues to have a productive cough.  He denies any other medication changes.      Activities of Daily Living:  Patient reports morning stiffness for 10 minutes.   Patient Reports nocturnal pain.  Difficulty dressing/grooming: Denies Difficulty climbing stairs: Denies Difficulty getting out of chair: Denies Difficulty using hands for taps, buttons, cutlery, and/or writing: Reports   Review of Systems  Constitutional: Positive for fatigue. Negative for night sweats.  HENT: Positive for mouth dryness. Negative for mouth sores and nose dryness.   Eyes: Negative for redness, visual disturbance and dryness.  Respiratory: Positive for cough. Negative for hemoptysis, shortness of breath and difficulty breathing.   Cardiovascular: Negative for chest pain,  palpitations, hypertension, irregular heartbeat and swelling in legs/feet.  Gastrointestinal: Positive for constipation and nausea. Negative for blood in stool and diarrhea.  Endocrine: Negative for increased urination.  Genitourinary: Negative for painful urination.  Musculoskeletal: Positive for arthralgias, joint pain, joint swelling and morning stiffness. Negative for myalgias, muscle weakness, muscle tenderness and myalgias.  Skin: Negative for color change, rash, hair loss, nodules/bumps, skin tightness, ulcers and sensitivity to sunlight.  Allergic/Immunologic: Negative for susceptible to infections.  Neurological: Negative for dizziness, fainting, memory loss, night sweats and weakness.  Hematological: Negative for swollen glands.  Psychiatric/Behavioral: Negative for depressed mood and sleep disturbance. The patient is not nervous/anxious.     PMFS History:  Patient Active Problem List   Diagnosis Date Noted  . Cerumen impaction 08/16/2017  . Dysphagia   . Esophageal stricture   . High risk medication use 10/21/2016  . Primary osteoarthritis of both knees 10/21/2016  . History of right knee joint replacement 10/21/2016  . Orthostatic hypotension 10/01/2016  . Diarrhea 08/18/2016  . Tegretol-induced dizziness 05/14/2016  . Spinal stenosis of lumbar region without neurogenic claudication 03/23/2016  . Spondylosis without myelopathy or radiculopathy, lumbar region 03/23/2016  . Respiratory failure, chronic ([place]) 11/21/2014  . DM (diabetes mellitus) type II controlled, neurological manifestation ([place])   . Atherosclerotic heart disease of native coronary artery with angina pectoris ([place])   . Ventral hernia 12/17/2013  . Seropositive rheumatoid arthritis ([place])   . Routine general medical examination at  a health care facility 08/29/2012  . CKD (chronic kidney disease) stage 4, GFR 15-29 ml/min ([name])   . ILD (interstitial lung disease) ([place]) 11/28/2011  . Chronic diastolic heart  failure ([place]) 09/14/2011  . Nausea 02/16/2011  . Obstructive sleep apnea 12/17/2008  . ESOPHAGEAL STRICTURE 10/09/2008  . Reflux esophagitis 09/10/2008  . DIVERTICULOSIS-COLON 09/10/2008  . Thoracic aorta atherosclerosis ([place]) 03/19/2008  . ACTINIC KERATOSIS 10/23/2007  . SLEEP DISORDER, CHRONIC 10/17/2006  . HLD (hyperlipidemia) 09/23/2006  . Essential hypertension 09/23/2006  . GERD 09/23/2006  . BENIGN PROSTATIC HYPERTROPHY 09/23/2006    Past Medical History:  Diagnosis Date  . Arthritis    osteoarthritis, s/p R TKR, and digits  . CAD (coronary artery disease)    a. s/p CABG (2001)  b. s/p DES to RCA and cutting POBA to ostial PDA (2013)   c. s/p DES to SVG to OM2 (01/14/14) d. cath: 08/2015 NSTEMI w/ patent LIMA-LAD and 99% stenosis of SVG-OM w/ DES placed. CTO of SVG-RCA and SVG-D1.   [name] Chronic diastolic CHF (congestive heart failure) ([place])    a) 09/13 ECHO- LVEF 24-26%, grade 1 diastolic dysfunction, mild LA dilatation, atrial septal aneurysm, AV mobility restricted, but no sig AS by doppler; b) 09/04/08 ECHO- LVH, ef 60%, mild AS, c. echo 08/2015: EF perserved of 55-60% with inferolateral HK. Mild AS noted.  . Chronic kidney disease, stage III (moderate) ([place])   . Chronic lower back pain   . Colon polyps   . Diverticulosis   . Dyspnea 2009 since July -Sept   05/06/08-CPST-  normal effort, reduced VO2 max 20.5 /65%, reduced at 8.2/ 40%, normal breathing resetvca of 55%, submaximal heart rate response 112/77%, flattened o2 pluse response at peak exercise-12 ml/beat @ 85%, No VQ mismatch abnormalities, All c/w CIRC Limitation  . Enlarged prostate   . Esophageal stricture    a. s/p dilation spring 2010  . GERD (gastroesophageal reflux disease)   . Heart murmur   . Hiatal hernia   . History of kidney stones   . History of PFTs    mixed pattern on spiro. mild restn on lung volumes with near normal DLCO. Pattern can be explained by CABG scar. Fev1 2.2L/73%, ratio 68 (67), TLC  4.7/68%,RV 1.5L/55%,DLCO 79%  . Hyperlipidemia   . Hypertension   . Interstitial lung disease ([name])    NOS  . Nausea & vomiting    2018/2019  . Overweight (BMI 25.0-29.9)    BMI 29  . RA (rheumatoid arthritis) ([name])    [name]  . Seropositive rheumatoid arthritis ([place])   . Type II diabetes mellitus ([name])     Family History  Problem Relation Age of Onset  . COPD Mother   . Heart disease Father   . Heart attack Father   . Diabetes Brother   . Colon cancer Brother 25  . Alcohol abuse Sister   . Stroke Sister    Past Surgical History:  Procedure Laterality Date  . CARDIAC CATHETERIZATION  08/2004   CP- no MI, Cath- small vessell disease   . CARDIAC CATHETERIZATION  12/31/2011   80% distal LM, 100% native LAD, LCx and RCA, 30% prox SVG-OM, SVG-D1 normal, 99% distal, 80% ostial SVG-RCA distal to graft, LIMA-LAD normal; LVEF mildly decreased with posterior basal AK   . CARDIAC CATHETERIZATION  2009   with patent grafts/notes 12/31/2011  . CARDIAC CATHETERIZATION N/A 08/13/2015   Procedure: Left Heart Cath and Cors/Grafts Angiography;  Surgeon: Sherren Mocha, MD;  Location: [facility]  INVASIVE CV LAB;  Service: Cardiovascular;  Laterality: N/A;  . CATARACT EXTRACTION W/ INTRAOCULAR LENS  IMPLANT, BILATERAL Bilateral   . CHOLECYSTECTOMY OPEN  11/2003   [name]  . CORONARY ANGIOPLASTY WITH STENT PLACEMENT  01/03/2012   Successful DES to SVG-RCA and cutting balloon angioplasty ostial  PDA   . CORONARY ANGIOPLASTY WITH STENT PLACEMENT  01/14/2014   "1"  . CORONARY ARTERY BYPASS GRAFT  11/1999   CABG X5  . CORONARY STENT PLACEMENT  02/2012   1 stent and balloon  . ESOPHAGOGASTRODUODENOSCOPY N/A 03/01/2017   Procedure: ESOPHAGOGASTRODUODENOSCOPY (EGD);  Surgeon: Irene Shipper, MD;  Location[facility];  Service: Endoscopy;  Laterality: N/A;  . ESOPHAGOGASTRODUODENOSCOPY (EGD) WITH ESOPHAGEAL DILATION  2010  . HAND SURGERY    . JOINT REPLACEMENT    . KNEE ARTHROSCOPY Right 2008  . LEFT  AND RIGHT HEART CATHETERIZATION WITH CORONARY ANGIOGRAM  12/31/2011   Procedure: LEFT AND RIGHT HEART CATHETERIZATION WITH CORONARY ANGIOGRAM;  Surgeon: Burnell Blanks, MD;  Location: [facility] CATH LAB;  Service: Cardiovascular;;  . LEFT AND RIGHT HEART CATHETERIZATION WITH CORONARY ANGIOGRAM N/A 01/14/2014   Procedure: LEFT AND RIGHT HEART CATHETERIZATION WITH CORONARY ANGIOGRAM;  Surgeon: Peter M Martinique, MD;  Location: [facility] CATH LAB;  Service: Cardiovascular;  Laterality: N/A;  . MALONEY DILATION  03/01/2017   Procedure: Venia Minks DILATION;  Surgeon: Irene Shipper, MD;  Location: [facility];  Service: Endoscopy;;  . PERCUTANEOUS CORONARY INTERVENTION-BALLOON ONLY  01/03/2012   Procedure: PERCUTANEOUS CORONARY INTERVENTION-BALLOON ONLY;  Surgeon: Peter M Martinique, MD;  [facility] CATH LAB;  Service: Cardiovascular;;  . PERCUTANEOUS CORONARY STENT INTERVENTION (PCI-S)  12/31/2011   Procedure: PERCUTANEOUS CORONARY STENT INTERVENTION (PCI-S);  Surgeon: Burnell Blanks, MD;  Location: [facility] CATH LAB;  Service: Cardiovascular;;  . PERCUTANEOUS CORONARY STENT INTERVENTION (PCI-S) N/A 01/03/2012   Procedure: PERCUTANEOUS CORONARY STENT INTERVENTION (PCI-S);  Surgeon: Peter M Martinique, MD;  Location: [facility] CATH LAB;  Service: Cardiovascular;  Laterality: N/A;  . SHOULDER ARTHROSCOPY WITH OPEN ROTATOR CUFF REPAIR AND DISTAL CLAVICLE ACROMINECTOMY Left 02/27/2013   Procedure: LEFT SHOULDER ARTHROSCOPY WITH MINI OPEN ROTATOR CUFF REPAIR AND SUBACROMIAL DECOMPRESSION AND DISTAL CLAVICLE RESECTION;  Surgeon: [name], MD;  Location: [facility];  Service: Orthopedics;  Laterality: Left;  . TOTAL KNEE ARTHROPLASTY Right 03/2010   [name]  . TRIGGER FINGER RELEASE Left 02/27/2013   Procedure: RELEASE TRIGGER FINGER/A-1 PULLEY;  Surgeon: [name], MD;  Location: [place];  Service: Orthopedics;  Laterality: Left;   Social History   Social History Narrative   No living will   Requests wife as health  care POA   Discussed DNR --he requests this (done 08/29/12)   Not sure about feeding tube---but might accept for some time   Patient lives with wife and daughter in a one story home.  Has 3 children.  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     Objective: Vital Signs: BP 134/77 (BP Location: Left Arm, Patient Position: Sitting, Cuff Size: Normal)   Pulse 73   Resp 16   Ht 6\' 1"  (1.854 m)   Wt 202 lb (91.6 kg)   BMI 26.65 kg/m    Physical Exam  Constitutional: He is oriented to person, place, and time. He appears well-developed and well-nourished.  HENT:  Head: Normocephalic and atraumatic.  Eyes: Pupils are equal, round, and reactive to light. Conjunctivae and EOM are normal.  Neck: Normal range of motion. Neck supple.  Cardiovascular: Normal  rate and regular rhythm.  Murmur heard. Pulmonary/Chest: Effort normal. He has rales.  Crackles auscultated at bibasilar lung bases  Abdominal: Soft. Bowel sounds are normal.  Lymphadenopathy:    He has no cervical adenopathy.  Neurological: He is alert and oriented to person, place, and time.  Skin: Skin is warm and dry. Capillary refill takes less than 2 seconds.  Psychiatric: He has a normal mood and affect. His behavior is normal.  Nursing note and vitals reviewed.    Musculoskeletal Exam:  C-spine limited ROM. Thoracic kyphosis noted.  Limited ROM of lumbar spine.  No midline spinal tenderness.  No SI joint tenderness.  Slightly limited ROM of bilateral shoulder joints.  Mild bilateral elbow contractures.  Limited ROM of bilateral wrist joints.  Hip joints good ROM.  Limited extension of bilateral knee joints.  Warmth of right knee joint.  Pedal edema bilaterally.   CDAI Exam: CDAI Homunculus Exam:   Joint Counts:  CDAI Tender Joint count: 0 CDAI Swollen Joint count: 0  Global Assessments:  Patient Global Assessment: 5 Provider Global Assessment: 5  CDAI Calculated Score: 10    Investigation: No  additional findings. CBC Latest Ref Rng & Units 06/24/2017 05/27/2017 04/29/2017  WBC 3.8 - 10.8 Thousand/uL 7.8 9.0 10.0  Hemoglobin 13.2 - 17.1 g/dL 12.0(L) 9.5(L) 8.8 Repeated and verified X2.(L)  Hematocrit 38.5 - 50.0 % 38.2(L) 31.2(L) 28.0(L)  Platelets 140 - 400 Thousand/uL 201 278 304.0   CMP Latest Ref Rng & Units 06/24/2017 05/27/2017 04/29/2017  Glucose 65 - 99 mg/dL 235(H) 168(H) 185(H)  BUN 7 - 25 mg/dL 30(H) 25 31(H)  Creatinine 0.70 - 1.11 mg/dL 2.28(H) 2.43(H) 2.29(H)  Sodium 135 - 146 mmol/L 142 141 137  Potassium 3.5 - 5.3 mmol/L 4.6 4.9 4.5  Chloride 98 - 110 mmol/L 102 99 96  CO2 20 - 32 mmol/L 32 30 31  Calcium 8.6 - 10.3 mg/dL 10.0 9.9 9.7  Total Protein 6.1 - 8.1 g/dL 7.6 7.7 8.1  Total Bilirubin 0.2 - 1.2 mg/dL 0.6 0.7 0.7  Alkaline Phos 39 - 117 U/L - - 67  AST 10 - 35 U/L 17 15 12   ALT 9 - 46 U/L 10 9 9      Imaging: No results found.  Speciality Comments: No specialty comments available.    Procedures:  No procedures performed Allergies: Doxazosin mesylate and Methocarbamol   Assessment / Plan:     Visit Diagnoses: Seropositive rheumatoid arthritis ([place]) - RF +, CCP+: He has no synovitis on exam. He continues to have joint pain and joint stiffness in bilateral hands.  He restarted on Imuran on 09/01/17 for management of RA and ILD.  He could not tolerate Imuran due to experiencing nausea and vomiting.  He discontinued Imuran on Thursday, and he continues to have nausea.  He has been taking Zofran 4 mg PRN for nausea. We will not restart him on Cellcept due to experiencing similar side effects as well as inducing anemia.   ILD (interstitial lung disease) ([place]) - Followed up by Dr. Chase Caller.  His last appointment was 08/01/17.  He continues to have crackles and rales in bilateral lung bases.  His dyspnea has improved.    High risk medication use - Patient developed nausea, vomiting and severe anemia on CellCept and Bactrim.  He was treated with Imuran in the  past and was restarted recently.  He discontinued Imuran on Thursday due to experiencing nausea and vomiting.  He continues to have nausea today.  He  has been taking Zofran 4 mg PRN for nausea and vomiting.    History of right knee joint replacement: Warmth but no effusion.  Limited extension.  He has chronic pain of the right knee joint.   Primary osteoarthritis of left knee: No warmth or effusion of let knee joint.  Limited extension.  He has discomfort in the left knee joint, especially if standing for prolonged periods of time.   Spondylosis without myelopathy or radiculopathy, lumbar region: No midline spinal tenderness.  Limited ROM of the lumbar spine.   History of anemia: He is currently on an iron supplement.  Anemia has improved since discontinuing Cellcept.   Other medical conditions are listed as follows:   Chronic diastolic heart failure ([name])  CKD (chronic kidney disease) stage 4, GFR 15-29 ml/min ([name])  History of coronary artery disease  History of diabetes mellitus  History of hyperlipidemia  History of sleep apnea  History of hypertension  History of gastroesophageal reflux (GERD)  History of diverticulosis    Orders: No orders of the defined types were placed in this encounter.  No orders of the defined types were placed in this encounter.   Face-to-face time spent with patient was 30 minutes.> 50% of time was spent in counseling and coordination of care.  Follow-Up Instructions: Return for Rheumatoid arthritis, Osteoarthritis.   Ofilia Neas, PA-C   I examined and evaluated the patient with Hazel Sams PA.  And had no synovitis on my examination.  He has been having vomiting even after stopping Imuran.  He had endoscopy and colonoscopy last week at [facility].  He still have GI work-up pending.  Based on his arthritis he does not need any prednisone.  I am uncertain if he needs prednisone for his interstitial lung disease.  I have sent a note to Dr.  Chase Caller for his input.  The plan of care was discussed as noted above.  [name], MD  Note - This record has been created using Editor, commissioning.  Chart creation errors have been sought, but may not always  have been located. Such creation errors do not reflect on  the standard of medical care.

## 2017-09-27 DIAGNOSIS — D509 Iron deficiency anemia, unspecified: Secondary | ICD-10-CM | POA: Diagnosis not present

## 2017-10-08 ENCOUNTER — Other Ambulatory Visit: Payer: Self-pay | Admitting: Rheumatology

## 2017-10-10 ENCOUNTER — Encounter: Payer: Medicare Other | Admitting: Student in an Organized Health Care Education/Training Program

## 2017-10-15 DIAGNOSIS — R0689 Other abnormalities of breathing: Secondary | ICD-10-CM | POA: Diagnosis not present

## 2017-10-15 DIAGNOSIS — J8410 Pulmonary fibrosis, unspecified: Secondary | ICD-10-CM | POA: Diagnosis not present

## 2017-10-17 ENCOUNTER — Ambulatory Visit (INDEPENDENT_AMBULATORY_CARE_PROVIDER_SITE_OTHER): Payer: Medicare Other | Admitting: Internal Medicine

## 2017-10-17 ENCOUNTER — Encounter: Payer: Self-pay | Admitting: Internal Medicine

## 2017-10-17 VITALS — BP 114/70 | HR 66 | Temp 97.4°F | Ht 73.0 in | Wt 198.0 lb

## 2017-10-17 DIAGNOSIS — E1140 Type 2 diabetes mellitus with diabetic neuropathy, unspecified: Secondary | ICD-10-CM | POA: Diagnosis not present

## 2017-10-17 DIAGNOSIS — I700 Atherosclerosis of aorta: Secondary | ICD-10-CM | POA: Diagnosis not present

## 2017-10-17 DIAGNOSIS — I5032 Chronic diastolic (congestive) heart failure: Secondary | ICD-10-CM

## 2017-10-17 DIAGNOSIS — R110 Nausea: Secondary | ICD-10-CM

## 2017-10-17 DIAGNOSIS — N184 Chronic kidney disease, stage 4 (severe): Secondary | ICD-10-CM

## 2017-10-17 LAB — RENAL FUNCTION PANEL
Albumin: 4.1 g/dL (ref 3.5–5.2)
BUN: 29 mg/dL — ABNORMAL HIGH (ref 6–23)
CALCIUM: 9.8 mg/dL (ref 8.4–10.5)
CHLORIDE: 97 meq/L (ref 96–112)
CO2: 31 meq/L (ref 19–32)
Creatinine, Ser: 2.47 mg/dL — ABNORMAL HIGH (ref 0.40–1.50)
GFR: 26.64 mL/min — AB (ref >60.00)
Glucose, Bld: 231 mg/dL — ABNORMAL HIGH (ref 70–99)
PHOSPHORUS: 3.5 mg/dL (ref 2.3–4.6)
POTASSIUM: 4.7 meq/L (ref 3.5–5.1)
Sodium: 138 mEq/L (ref 135–145)

## 2017-10-17 LAB — LIPID PANEL
CHOLESTEROL: 138 mg/dL (ref 0–200)
HDL: 34.4 mg/dL — AB (ref >39.00)
NONHDL: 103.15
TRIGLYCERIDES: 233 mg/dL — AB (ref 0.0–149.0)
Total CHOL/HDL Ratio: 4
VLDL: 46.6 mg/dL — ABNORMAL HIGH (ref 0.0–40.0)

## 2017-10-17 LAB — HEMOGLOBIN A1C: Hgb A1c MFr Bld: 8.1 % — ABNORMAL HIGH (ref 4.6–6.5)

## 2017-10-17 LAB — LDL CHOLESTEROL, DIRECT: LDL DIRECT: 73 mg/dL

## 2017-10-17 LAB — HM DIABETES FOOT EXAM

## 2017-10-17 NOTE — Assessment & Plan Note (Signed)
Needs A1c  Will stop the metformin given his CKD Accept A1c up to 9% to limit medications

## 2017-10-17 NOTE — Assessment & Plan Note (Signed)
Persists despite extensive testing Will try off statin and metformin

## 2017-10-17 NOTE — Patient Instructions (Signed)
Please try not taking the atorvastatin and metformin. You should stay off the metformin permanently. If your stomach is better off the atorvastatin, stay off it. But if you don't notice any difference after 2-3 weeks, go ahead and restart it.

## 2017-10-17 NOTE — Assessment & Plan Note (Signed)
Has mild aortic stenosis and atherosclerosis On ASA Will reassess if statin causing nausea

## 2017-10-17 NOTE — Assessment & Plan Note (Signed)
Seems to be compensated 

## 2017-10-17 NOTE — Assessment & Plan Note (Signed)
Follows with nephrology Stop metformin

## 2017-10-17 NOTE — Progress Notes (Signed)
Subjective:    Patient ID: [name], male    DOB: 11/17/1931, 84 y.o.   MRN: [id number]  HPI Here for follow up of diabetes and other chronic medical conditions  Still having nausea Ondansetron doesn't help much Trouble swallowing despite dilation--has to regurgitate things at times Did have EGD and colon. Bleeding spot found on video study Ongoing constipation despite linzess---but has only tried '145mg'$  only Just doesn't feel right--not for a while now  No chest pain--just stomach pain Breathing is about the same.  Only occasional cough No severe DOE that is limiting him  Sugars checked 2-3 times a week Usually in 180's No hypoglycemic reactions Ongoing neuropathic pain. Not much success even with the pain doctor's help  RA control seems okay Only mild hand pain  Current Outpatient Medications on File Prior to Visit  Medication Sig Dispense Refill  . aspirin EC 81 MG tablet Take 81 mg by mouth at bedtime.    [name] atorvastatin (LIPITOR) 80 MG tablet Take 80 mg by mouth daily at 6 PM.     . Blood Glucose Monitoring Suppl (ONE TOUCH ULTRA 2) w/Device KIT Use to obtain blood sugar daily. Dx Code E11.40 1 each 0  . carvedilol (COREG) 3.125 MG tablet Take 1 tablet (3.125 mg total) by mouth 2 (two) times daily with a meal. 180 tablet 3  . cholecalciferol (VITAMIN D) 1000 units tablet Take 2,000 Units by mouth daily.     . clopidogrel (PLAVIX) 75 MG tablet TAKE 1 TABLET BY MOUTH DAILY WITH BREAKFAST. 30 tablet 10  . DHA-Vitamin C-Lutein (EYE HEALTH FORMULA PO) Take 1 tablet by mouth daily.     . ferrous sulfate 324 (65 Fe) MG TBEC Take 1 tablet by mouth daily.    . furosemide (LASIX) 40 MG tablet TAKE 1 TO 2 TABLETS BY MOUTH EVERY DAY 60 tablet 11  . glucose blood (ONE TOUCH ULTRA TEST) test strip USE TO CHECK BLOOD SUGAR ONCE A DAY Dx Code E11.40 100 each 3  . HYDROcodone-acetaminophen (NORCO) 10-325 MG tablet Take 1-2 tablets by mouth every 4 (four) hours as needed. 180 tablet  0  . nitroGLYCERIN (NITROSTAT) 0.4 MG SL tablet DISSOLVE 1 TABLET UNDER THE TONGUE FOR CHEST PAIN. MAY REPEAT EVERY 5MINUTES UP TO 3 DOSES. IF NO RELIEF, CALL 911** 25 tablet 5  . ondansetron (ZOFRAN) 4 MG tablet TAKE 1 TABLET BY MOUTH EVERY 8 HOURS AS NEEDED FOR NAUSEA OR VOMITING 60 tablet 0  . ONETOUCH DELICA LANCETS 02H MISC 1 each by In Vitro route daily. Dx Code E11.49 100 each 3  . OXYGEN Inhale into the lungs. Per pt- Uses Oxygen 2 liters at night.    . pantoprazole (PROTONIX) 40 MG tablet TAKE 1 TABLET BY MOUTH TWICE (2) DAILY 60 tablet 11   No current facility-administered medications on file prior to visit.     Allergies  Allergen Reactions  . Doxazosin Mesylate Other (See Comments)    dizziness  . Methocarbamol Rash    Past Medical History:  Diagnosis Date  . Arthritis    osteoarthritis, s/p R TKR, and digits  . CAD (coronary artery disease)    a. s/p CABG (2001)  b. s/p DES to RCA and cutting POBA to ostial PDA (2013)   c. s/p DES to SVG to OM2 (01/14/14) d. cath: 08/2015 NSTEMI w/ patent LIMA-LAD and 99% stenosis of SVG-OM w/ DES placed. CTO of SVG-RCA and SVG-D1.   [name] Chronic diastolic CHF (congestive heart failure) (  [place])    a) 09/13 ECHO- LVEF 16-07%, grade 1 diastolic dysfunction, mild LA dilatation, atrial septal aneurysm, AV mobility restricted, but no sig AS by doppler; b) 09/04/08 ECHO- LVH, ef 60%, mild AS, c. echo 08/2015: EF perserved of 55-60% with inferolateral HK. Mild AS noted.  . Chronic kidney disease, stage III (moderate) ([place])   . Chronic lower back pain   . Colon polyps   . Diverticulosis   . Dyspnea 2009 since July -Sept   05/06/08-CP[facility]-  normal effort, reduced VO2 max 20.5 /65%, reduced at 8.2/ 40%, normal breathing resetvca of 55%, submaximal heart rate response 112/77%, flattened o2 pluse response at peak exercise-12 ml/beat @ 85%, No VQ mismatch abnormalities, All c/w CIRC Limitation  . Enlarged prostate   . Esophageal stricture    a. s/p dilation  spring 2010  . GERD (gastroesophageal reflux disease)   . Heart murmur   . Hiatal hernia   . History of kidney stones   . History of PFTs    mixed pattern on spiro. mild restn on lung volumes with near normal DLCO. Pattern can be explained by CABG scar. Fev1 2.2L/73%, ratio 68 (67), TLC 4.7/68%,RV 1.5L/55%,DLCO 79%  . Hyperlipidemia   . Hypertension   . Interstitial lung disease ([name])    NOS  . Nausea & vomiting    2018/2019  . Overweight (BMI 25.0-29.9)    BMI 29  . RA (rheumatoid arthritis) ([name])    [name]  . Seropositive rheumatoid arthritis ([place])   . Type II diabetes mellitus ([place])     Past Surgical History:  Procedure Laterality Date  . CARDIAC CATHETERIZATION  08/2004   CP- no MI, Cath- small vessell disease   . CARDIAC CATHETERIZATION  12/31/2011   80% distal LM, 100% native LAD, LCx and RCA, 30% prox SVG-OM, SVG-D1 normal, 99% distal, 80% ostial SVG-RCA distal to graft, LIMA-LAD normal; LVEF mildly decreased with posterior basal AK   . CARDIAC CATHETERIZATION  2009   with patent grafts/notes 12/31/2011  . CARDIAC CATHETERIZATION N/A 08/13/2015   Procedure: Left Heart Cath and Cors/Grafts Angiography;  Surgeon: Sherren Mocha, MD;  Location: [facility];  Service: Cardiovascular;  Laterality: N/A;  . CATARACT EXTRACTION W/ INTRAOCULAR LENS  IMPLANT, BILATERAL Bilateral   . CHOLECYSTECTOMY OPEN  11/2003   [name]  . CORONARY ANGIOPLASTY WITH STENT PLACEMENT  01/03/2012   Successful DES to SVG-RCA and cutting balloon angioplasty ostial  PDA   . CORONARY ANGIOPLASTY WITH STENT PLACEMENT  01/14/2014   "1"  . CORONARY ARTERY BYPASS GRAFT  11/1999   CABG X5  . CORONARY STENT PLACEMENT  02/2012   1 stent and balloon  . ESOPHAGOGASTRODUODENOSCOPY N/A 03/01/2017   Procedure: ESOPHAGOGASTRODUODENOSCOPY (EGD);  Surgeon: Irene Shipper, MD;  Location: [facility];  Service: Endoscopy;  Laterality: N/A;  . ESOPHAGOGASTRODUODENOSCOPY (EGD) WITH ESOPHAGEAL DILATION  2010  .  HAND SURGERY    . JOINT REPLACEMENT    . KNEE ARTHROSCOPY Right 2008  . LEFT AND RIGHT HEART CATHETERIZATION WITH CORONARY ANGIOGRAM  12/31/2011   Procedure: LEFT AND RIGHT HEART CATHETERIZATION WITH CORONARY ANGIOGRAM;  Surgeon: Burnell Blanks, MD;  Location: [facility] C[facility] LAB;  Service: Cardiovascular;;  . LEFT AND RIGHT HEART CATHETERIZATION WITH CORONARY ANGIOGRAM N/A 01/14/2014   Procedure: LEFT AND RIGHT HEART CATHETERIZATION WITH CORONARY ANGIOGRAM;  Surgeon: Peter M Martinique, MD;  Location: [facility] CATH LAB;  Service: Cardiovascular;  Laterality: N/A;  . MALONEY DILATION  03/01/2017   Procedure: MALONEY DILATION;  Surgeon: Irene Shipper, MD;  Location: [facility];  Service: Endoscopy;;  . PERCUTANEOUS CORONARY INTERVENTION-BALLOON ONLY  01/03/2012   Procedure: PERCUTANEOUS CORONARY INTERVENTION-BALLOON ONLY;  Surgeon: Peter M Martinique, MD;  Location: [facility] CATH LAB;  Service: Cardiovascular;;  . PERCUTANEOUS CORONARY STENT INTERVENTION (PCI-S)  12/31/2011   Procedure: PERCUTANEOUS CORONARY STENT INTERVENTION (PCI-S);  Surgeon: Burnell Blanks, MD;  Location: [facility] CATH LAB;  Service: Cardiovascular;;  . PERCUTANEOUS CORONARY STENT INTERVENTION (PCI-S) N/A 01/03/2012   Procedure: PERCUTANEOUS CORONARY STENT INTERVENTION (PCI-S);  Surgeon: Peter M Martinique, MD;  Location: [facility] CATH LAB;  Service: Cardiovascular;  Laterality: N/A;  . SHOULDER ARTHROSCOPY WITH OPEN ROTATOR CUFF REPAIR AND DISTAL CLAVICLE ACROMINECTOMY Left 02/27/2013   Procedure: LEFT SHOULDER ARTHROSCOPY WITH MINI OPEN ROTATOR CUFF REPAIR AND SUBACROMIAL DECOMPRESSION AND DISTAL CLAVICLE RESECTION;  Surgeon: [name], MD;  Location: [facility];  Service: Orthopedics;  Laterality: Left;  . TOTAL KNEE ARTHROPLASTY Right 03/2010   [name]  . TRIGGER FINGER RELEASE Left 02/27/2013   Procedure: RELEASE TRIGGER FINGER/A-1 PULLEY;  Surgeon: [name], MD;  Location: [facility];  Service: Orthopedics;  Laterality: Left;    Family  History  Problem Relation Age of Onset  . COPD Mother   . Heart disease Father   . Heart attack Father   . Diabetes Brother   . Colon cancer Brother 25  . Alcohol abuse Sister   . Stroke Sister     Social History   Socioeconomic History  . Marital status: Married    Spouse name: Not on file  . Number of children: 3  . Years of education: Not on file  . Highest education level: Not on file  Occupational History  . 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 Financial resource strain: Not on file  . Food insecurity:    Worry: Not on file    Inability: Not on file  . Transportation needs:    Medical: Not on file    Non-medical: Not on file  Tobacco Use  . Smoking status: Former Smoker    Packs/day: 1.00    Years: 20.00    Pack years: 20.00    Types: Cigarettes    Last attempt to quit: 04/06/1963    Years since quitting: 54.5  . Smokeless tobacco: Never Used  Substance and Sexual Activity  . Alcohol use: No    Alcohol/week: 0.0 oz    Comment: 01/01/2012 "last alcohol ~ 50 yr ago"  . Drug use: No  . Sexual activity: Not Currently  Lifestyle  . Physical activity:    Days per week: Not on file    Minutes per session: Not on file  . Stress: Not on file  Relationships  . Social connections:    Talks on phone: Not on file    Gets together: Not on file    Attends religious service: Not on file    Active member of club or organization: Not on file    Attends meetings of clubs or organizations: Not on file    Relationship status: Not on file  . Intimate partner violence:    Fear of current or ex partner: Not on file    Emotionally abused: Not on file    Physically abused: Not on file    Forced sexual activity: Not on file  Other Topics Concern  . Not on file  Social History Narrative   No living will  Requests wife as health care POA   Discussed DNR --he requests this (done 08/29/12)   Not sure about feeding tube---but might  accept for some time   Patient lives with wife and daughter in a one story home.  Has 3 children.  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   Review of Systems Appetite poor with the nausea Weight down some     Objective:   Physical Exam  Constitutional: He appears well-developed. No distress.  Neck: No thyromegaly present.  Cardiovascular: Normal rate and regular rhythm. Exam reveals no gallop.  Gr 3/6 systolic murmur towards base Faint pedal pulses  Respiratory:  Diminished breath sounds with bibasilar crackles  Lymphadenopathy:    He has no cervical adenopathy.  Neurological:  Significantly decreased sensation in feet  Skin:  No foot lesions  Psychiatric:  Frustrated but not depressed           Assessment & Plan:

## 2017-10-24 ENCOUNTER — Other Ambulatory Visit: Payer: Self-pay

## 2017-10-24 DIAGNOSIS — H353221 Exudative age-related macular degeneration, left eye, with active choroidal neovascularization: Secondary | ICD-10-CM | POA: Diagnosis not present

## 2017-10-24 DIAGNOSIS — D3131 Benign neoplasm of right choroid: Secondary | ICD-10-CM | POA: Diagnosis not present

## 2017-10-24 DIAGNOSIS — H35372 Puckering of macula, left eye: Secondary | ICD-10-CM | POA: Diagnosis not present

## 2017-10-24 DIAGNOSIS — H353213 Exudative age-related macular degeneration, right eye, with inactive scar: Secondary | ICD-10-CM | POA: Diagnosis not present

## 2017-10-24 NOTE — Patient Outreach (Signed)
[facility]) Care Management  10/24/2017  ANAS REISTER April 12, 1931 [id number]   Medication Adherence call to Mr. Aryn Kops spoke with patient he said he was taken off both medication Atorvastatin 80 mg and Metformin 1000 mg  patient is no longer taking both medication.Mr. Gaglio is showing past due under [place].  [facility] Management Direct Dial [phone]  Fax [phone] [email or web address]

## 2017-10-26 ENCOUNTER — Ambulatory Visit
Payer: Medicare Other | Attending: Student in an Organized Health Care Education/Training Program | Admitting: Nurse Practitioner

## 2017-10-26 ENCOUNTER — Encounter: Payer: Self-pay | Admitting: Nurse Practitioner

## 2017-10-26 ENCOUNTER — Telehealth: Payer: Self-pay | Admitting: *Deleted

## 2017-10-26 VITALS — BP 150/83 | HR 63 | Temp 97.6°F | Resp 16 | Ht 73.0 in | Wt 198.0 lb

## 2017-10-26 DIAGNOSIS — I130 Hypertensive heart and chronic kidney disease with heart failure and stage 1 through stage 4 chronic kidney disease, or unspecified chronic kidney disease: Secondary | ICD-10-CM | POA: Insufficient documentation

## 2017-10-26 DIAGNOSIS — Z7902 Long term (current) use of antithrombotics/antiplatelets: Secondary | ICD-10-CM | POA: Diagnosis not present

## 2017-10-26 DIAGNOSIS — E1122 Type 2 diabetes mellitus with diabetic chronic kidney disease: Secondary | ICD-10-CM | POA: Insufficient documentation

## 2017-10-26 DIAGNOSIS — J849 Interstitial pulmonary disease, unspecified: Secondary | ICD-10-CM | POA: Diagnosis not present

## 2017-10-26 DIAGNOSIS — Z87891 Personal history of nicotine dependence: Secondary | ICD-10-CM | POA: Insufficient documentation

## 2017-10-26 DIAGNOSIS — K5900 Constipation, unspecified: Secondary | ICD-10-CM | POA: Diagnosis not present

## 2017-10-26 DIAGNOSIS — N184 Chronic kidney disease, stage 4 (severe): Secondary | ICD-10-CM | POA: Diagnosis not present

## 2017-10-26 DIAGNOSIS — Z79891 Long term (current) use of opiate analgesic: Secondary | ICD-10-CM

## 2017-10-26 DIAGNOSIS — M79671 Pain in right foot: Secondary | ICD-10-CM | POA: Diagnosis present

## 2017-10-26 DIAGNOSIS — M059 Rheumatoid arthritis with rheumatoid factor, unspecified: Secondary | ICD-10-CM | POA: Insufficient documentation

## 2017-10-26 DIAGNOSIS — I700 Atherosclerosis of aorta: Secondary | ICD-10-CM | POA: Insufficient documentation

## 2017-10-26 DIAGNOSIS — Z8249 Family history of ischemic heart disease and other diseases of the circulatory system: Secondary | ICD-10-CM | POA: Insufficient documentation

## 2017-10-26 DIAGNOSIS — Z825 Family history of asthma and other chronic lower respiratory diseases: Secondary | ICD-10-CM | POA: Insufficient documentation

## 2017-10-26 DIAGNOSIS — Z7982 Long term (current) use of aspirin: Secondary | ICD-10-CM | POA: Diagnosis not present

## 2017-10-26 DIAGNOSIS — Z9842 Cataract extraction status, left eye: Secondary | ICD-10-CM | POA: Insufficient documentation

## 2017-10-26 DIAGNOSIS — M25561 Pain in right knee: Secondary | ICD-10-CM | POA: Insufficient documentation

## 2017-10-26 DIAGNOSIS — Z9841 Cataract extraction status, right eye: Secondary | ICD-10-CM | POA: Insufficient documentation

## 2017-10-26 DIAGNOSIS — I951 Orthostatic hypotension: Secondary | ICD-10-CM | POA: Diagnosis not present

## 2017-10-26 DIAGNOSIS — N400 Enlarged prostate without lower urinary tract symptoms: Secondary | ICD-10-CM | POA: Insufficient documentation

## 2017-10-26 DIAGNOSIS — Z9981 Dependence on supplemental oxygen: Secondary | ICD-10-CM | POA: Diagnosis not present

## 2017-10-26 DIAGNOSIS — R1310 Dysphagia, unspecified: Secondary | ICD-10-CM | POA: Insufficient documentation

## 2017-10-26 DIAGNOSIS — M79604 Pain in right leg: Secondary | ICD-10-CM | POA: Diagnosis not present

## 2017-10-26 DIAGNOSIS — M792 Neuralgia and neuritis, unspecified: Secondary | ICD-10-CM | POA: Insufficient documentation

## 2017-10-26 DIAGNOSIS — G8929 Other chronic pain: Secondary | ICD-10-CM

## 2017-10-26 DIAGNOSIS — G894 Chronic pain syndrome: Secondary | ICD-10-CM | POA: Diagnosis not present

## 2017-10-26 DIAGNOSIS — D509 Iron deficiency anemia, unspecified: Secondary | ICD-10-CM | POA: Diagnosis not present

## 2017-10-26 DIAGNOSIS — Z961 Presence of intraocular lens: Secondary | ICD-10-CM | POA: Insufficient documentation

## 2017-10-26 DIAGNOSIS — I5032 Chronic diastolic (congestive) heart failure: Secondary | ICD-10-CM | POA: Diagnosis not present

## 2017-10-26 DIAGNOSIS — Z79899 Other long term (current) drug therapy: Secondary | ICD-10-CM | POA: Diagnosis not present

## 2017-10-26 DIAGNOSIS — R197 Diarrhea, unspecified: Secondary | ICD-10-CM | POA: Diagnosis not present

## 2017-10-26 DIAGNOSIS — Z823 Family history of stroke: Secondary | ICD-10-CM | POA: Insufficient documentation

## 2017-10-26 DIAGNOSIS — M25562 Pain in left knee: Secondary | ICD-10-CM | POA: Insufficient documentation

## 2017-10-26 DIAGNOSIS — Z833 Family history of diabetes mellitus: Secondary | ICD-10-CM | POA: Insufficient documentation

## 2017-10-26 DIAGNOSIS — I2510 Atherosclerotic heart disease of native coronary artery without angina pectoris: Secondary | ICD-10-CM | POA: Insufficient documentation

## 2017-10-26 DIAGNOSIS — Z800 Family history of malignant neoplasm of digestive organs: Secondary | ICD-10-CM | POA: Insufficient documentation

## 2017-10-26 DIAGNOSIS — M47816 Spondylosis without myelopathy or radiculopathy, lumbar region: Secondary | ICD-10-CM | POA: Insufficient documentation

## 2017-10-26 DIAGNOSIS — K219 Gastro-esophageal reflux disease without esophagitis: Secondary | ICD-10-CM | POA: Insufficient documentation

## 2017-10-26 DIAGNOSIS — Z96651 Presence of right artificial knee joint: Secondary | ICD-10-CM | POA: Insufficient documentation

## 2017-10-26 DIAGNOSIS — Z951 Presence of aortocoronary bypass graft: Secondary | ICD-10-CM | POA: Insufficient documentation

## 2017-10-26 DIAGNOSIS — Z811 Family history of alcohol abuse and dependence: Secondary | ICD-10-CM | POA: Insufficient documentation

## 2017-10-26 DIAGNOSIS — E785 Hyperlipidemia, unspecified: Secondary | ICD-10-CM | POA: Insufficient documentation

## 2017-10-26 DIAGNOSIS — G4733 Obstructive sleep apnea (adult) (pediatric): Secondary | ICD-10-CM | POA: Insufficient documentation

## 2017-10-26 DIAGNOSIS — J9610 Chronic respiratory failure, unspecified whether with hypoxia or hypercapnia: Secondary | ICD-10-CM | POA: Diagnosis not present

## 2017-10-26 DIAGNOSIS — M79605 Pain in left leg: Secondary | ICD-10-CM

## 2017-10-26 DIAGNOSIS — Z955 Presence of coronary angioplasty implant and graft: Secondary | ICD-10-CM | POA: Diagnosis not present

## 2017-10-26 DIAGNOSIS — E1142 Type 2 diabetes mellitus with diabetic polyneuropathy: Secondary | ICD-10-CM | POA: Insufficient documentation

## 2017-10-26 DIAGNOSIS — Z9049 Acquired absence of other specified parts of digestive tract: Secondary | ICD-10-CM | POA: Insufficient documentation

## 2017-10-26 DIAGNOSIS — M48061 Spinal stenosis, lumbar region without neurogenic claudication: Secondary | ICD-10-CM | POA: Insufficient documentation

## 2017-10-26 MED ORDER — HYDROCODONE-ACETAMINOPHEN 10-325 MG PO TABS: 1.0000 | ORAL_TABLET | ORAL | 0 refills | Status: DC | PRN

## 2017-10-26 NOTE — Progress Notes (Signed)
Nursing Pain Medication Assessment:  Safety precautions to be maintained throughout the outpatient stay will include: orient to surroundings, keep bed in low position, maintain call bell within reach at all times, provide assistance with transfer out of bed and ambulation.  Medication Inspection Compliance: Pill count conducted under aseptic conditions, in front of the patient. Neither the pills nor the bottle was removed from the patient's sight at any time. Once count was completed pills were immediately returned to the patient in their original bottle.  Medication: Hydrocodone/APAP Pill/Patch Count: 31 of 180 pills remain Pill/Patch Appearance: Markings consistent with prescribed medication Bottle Appearance: Standard pharmacy container. Clearly labeled. Filled Date: 06 / 25 / 2019 Last Medication intake:  Today

## 2017-10-26 NOTE — Patient Instructions (Signed)
____________________________________________________________________________________________  Medication Rules  Applies to: All patients receiving prescriptions (written or electronic).  Pharmacy of record: Pharmacy where electronic prescriptions will be sent. If written prescriptions are taken to a different pharmacy, please inform the nursing staff. The pharmacy listed in the electronic medical record should be the one where you would like electronic prescriptions to be sent.  Prescription refills: Only during scheduled appointments. Applies to both, written and electronic prescriptions.  NOTE: The following applies primarily to controlled substances (Opioid* Pain Medications).   Patient's responsibilities: 1. Pain Pills: Bring all pain pills to every appointment (except for procedure appointments). 2. Pill Bottles: Bring pills in original pharmacy bottle. Always bring newest bottle. Bring bottle, even if empty. 3. Medication refills: You are responsible for knowing and keeping track of what medications you need refilled. The day before your appointment, write a list of all prescriptions that need to be refilled. Bring that list to your appointment and give it to the admitting nurse. Prescriptions will be written only during appointments. If you forget a medication, it will not be "Called in", "Faxed", or "electronically sent". You will need to get another appointment to get these prescribed. 4. Prescription Accuracy: You are responsible for carefully inspecting your prescriptions before leaving our office. Have the discharge nurse carefully go over each prescription with you, before taking them home. Make sure that your name is accurately spelled, that your address is correct. Check the name and dose of your medication to make sure it is accurate. Check the number of pills, and the written instructions to make sure they are clear and accurate. Make sure that you are given enough medication to last  until your next medication refill appointment. 5. Taking Medication: Take medication as prescribed. Never take more pills than instructed. Never take medication more frequently than prescribed. Taking less pills or less frequently is permitted and encouraged, when it comes to controlled substances (written prescriptions).  6. Inform other Doctors: Always inform, all of your healthcare providers, of all the medications you take. 7. Pain Medication from other Providers: You are not allowed to accept any additional pain medication from any other Doctor or Healthcare provider. There are two exceptions to this rule. (see below) In the event that you require additional pain medication, you are responsible for notifying us, as stated below. 8. Medication Agreement: You are responsible for carefully reading and following our Medication Agreement. This must be signed before receiving any prescriptions from our practice. Safely store a copy of your signed Agreement. Violations to the Agreement will result in no further prescriptions. (Additional copies of our Medication Agreement are available upon request.) 9. Laws, Rules, & Regulations: All patients are expected to follow all Federal and State Laws, Statutes, Rules, & Regulations. Ignorance of the Laws does not constitute a valid excuse. The use of any illegal substances is prohibited. 10. Adopted CDC guidelines & recommendations: Target dosing levels will be at or below 60 MME/day. Use of benzodiazepines** is not recommended.  Exceptions: There are only two exceptions to the rule of not receiving pain medications from other Healthcare Providers. 1. Exception #1 (Emergencies): In the event of an emergency (i.e.: accident requiring emergency care), you are allowed to receive additional pain medication. However, you are responsible for: As soon as you are able, call our office [phone], at any time of the day or night, and leave a message stating your name, the  date and nature of the emergency, and the name and dose of the medication   prescribed. In the event that your call is answered by a member of our staff, make sure to document and save the date, time, and the name of the person that took your information.  2. Exception #2 (Planned Surgery): In the event that you are scheduled by another doctor or dentist to have any type of surgery or procedure, you are allowed (for a period no longer than 30 days), to receive additional pain medication, for the acute post-op pain. However, in this case, you are responsible for picking up a copy of our "Post-op Pain Management for Surgeons" handout, and giving it to your surgeon or dentist. This document is available at our office, and does not require an appointment to obtain it. Simply go to our office during business hours (Monday-Thursday from 8:00 AM to 4:00 PM) (Friday 8:00 AM to 12:00 Noon) or if you have a scheduled appointment with us, prior to your surgery, and ask for it by name. In addition, you will need to provide us with your name, name of your surgeon, type of surgery, and date of procedure or surgery.  *Opioid medications include: morphine, codeine, oxycodone, oxymorphone, hydrocodone, hydromorphone, meperidine, tramadol, tapentadol, buprenorphine, fentanyl, methadone. **Benzodiazepine medications include: diazepam (Valium), alprazolam (Xanax), clonazepam (Klonopine), lorazepam (Ativan), clorazepate (Tranxene), chlordiazepoxide (Librium), estazolam (Prosom), oxazepam (Serax), temazepam (Restoril), triazolam (Halcion) (Last updated: 06/02/2017) ____________________________________________________________________________________________    

## 2017-10-26 NOTE — Telephone Encounter (Signed)
Spoke with patient to let him know that [facility] has talked with with Dr [name] and he doesn't want to increase medications.  Rx for hycrocodone - apap 10-325 mg x 1 month to begin filling 10/31/17 here for him to pick up.  Patient verbalizes u/o information.

## 2017-10-26 NOTE — Progress Notes (Signed)
Patient's Name: [name]  MRN: [id number]  Referring Provider: [name], MD  DOB: 24-Nov-1931  PCP: [name], MD  DOS: 10/26/2017  Note by: Vevelyn Francois NP  Service setting: Ambulatory outpatient  Specialty: Interventional Pain Management  Location: [facility] (AMB) Pain Management Facility    Patient type: Established    Primary Reason(s) for Visit: Encounter for prescription drug management. (Level of risk: moderate)  CC: Foot Pain (bilateral) and Knee Pain (bilateral.  right s/p joint replacement )  HPI  [name] is a 84 y.o. year old, male patient, who comes today for a medication management evaluation. He has Familial multiple lipoprotein-type hyperlipidemia; Obstructive sleep apnea; Essential hypertension; Coronary atherosclerosis; Reflux esophagitis; ESOPHAGEAL STRICTURE; GERD; Diverticulosis of colon; BENIGN PROSTATIC HYPERTROPHY; Actinic keratosis; SLEEP DISORDER, CHRONIC; Nausea; Chronic diastolic heart failure ([name]); ILD (interstitial lung disease) ([place]); CKD (chronic kidney disease) stage 4, GFR 15-29 ml/min ([place]); Routine general medical examination at a health care facility; Seropositive rheumatoid arthritis ([place]); Ventral hernia; DM (diabetes mellitus) type II controlled, neurological manifestation ([place]); Atherosclerotic heart disease of native coronary artery with angina pectoris ([place]); Respiratory failure, chronic ([place]); Spinal stenosis of lumbar region without neurogenic claudication; Spondylosis without myelopathy or radiculopathy, lumbar region; Tegretol-induced dizziness; Diarrhea; Orthostatic hypotension; High risk medication use; Primary osteoarthritis of both knees; History of right knee joint replacement; Dysphagia; Esophageal stricture; Cerumen impaction; Aortic valve disorder; Enlarged prostate without lower urinary tract symptoms (luts); Iron deficiency anemia; Rheumatic fever without heart involvement; Type 2 diabetes mellitus with diabetic polyneuropathy  ([place]); Intermediate coronary syndrome ([place]); Thoracic aorta atherosclerosis ([place]); Constipation; HLD (hyperlipidemia); Rheumatoid arthritis ([place]); Gastro-esophageal reflux disease with esophagitis; Routine history and physical examination of adult; Disturbance in sleep behavior; Chronic pain of both lower extremities; Neuropathic pain; and Chronic pain syndrome on their problem list. His primarily concern today is the Foot Pain (bilateral) and Knee Pain (bilateral.  right s/p joint replacement )  Pain Assessment: Location: Left, Right Foot(knee and back) Radiating: up into ankles  Onset: More than a month ago Duration: Chronic pain Quality: Discomfort, Aching, Numbness, Throbbing Severity: 5 /10 (subjective, self-reported pain score)  Note: Reported level is compatible with observation.                         When using our objective Pain Scale, levels between 6 and 10/10 are said to belong in an emergency room, as it progressively worsens from a 6/10, described as severely limiting, requiring emergency care not usually available at an outpatient pain management facility. At a 6/10 level, communication becomes difficult and requires great effort. Assistance to reach the emergency department may be required. Facial flushing and profuse sweating along with potentially dangerous increases in heart rate and blood pressure will be evident. Effect on ADL: does what he needs to do  Timing: Constant Modifying factors: medications help but don't knock the pain out.   BP: (!) 150/83  HR: 63  [name] was last scheduled for an appointment on Visit date not found for medication management. During today's appointment we reviewed [name] chronic pain status, as well as his outpatient medication regimen. He has numbness tingling and sharp pain. He denies any weakness. He has diabetes but he states that he was taken off his medication last week. This was related to nausea. He states that he does have  vomiting. He admits that he has been doing this for years.  His A1C was 8.1 He admits that  his blood glucose was 181. He will follow up with PCP in 6 months. He admits that he has a bleeding "blood vessel". He will follow up with GI at [facility] in August. He is on iron for anemia. He admits that he is taking the Norco 2 tabs TID. He admits that it is not taking the pain away. He wants to know if there is anything any stronger. He has failed gabapentin and  Lyrica. He has failed the lidocaine. He is suppose to use the Norco 6 times daily. He admits that he takes it and noon and then again at 10 pm. He admits that the pain is worse during the night.    The patient  reports that he does not use drugs. His body mass index is 26.12 kg/m.  Further details on both, my assessment(s), as well as the proposed treatment plan, please see below.  Controlled Substance Pharmacotherapy Assessment REMS (Risk Evaluation and Mitigation Strategy)  Analgesic: Hydrocodone 10 mg, 4 times daily as needed, quantity 180/month MME/day: 60 mg/day.     Janett Billow, RN  10/26/2017 11:03 AM  Sign at close encounter Nursing Pain Medication Assessment:  Safety precautions to be maintained throughout the outpatient stay will include: orient to surroundings, keep bed in low position, maintain call bell within reach at all times, provide assistance with transfer out of bed and ambulation.  Medication Inspection Compliance: Pill count conducted under aseptic conditions, in front of the patient. Neither the pills nor the bottle was removed from the patient's sight at any time. Once count was completed pills were immediately returned to the patient in their original bottle.  Medication: Hydrocodone/APAP Pill/Patch Count: 31 of 180 pills remain Pill/Patch Appearance: Markings consistent with prescribed medication Bottle Appearance: Standard pharmacy container. Clearly labeled. Filled Date: 06 / 25 / 2019 Last Medication intake:   Today   Pharmacokinetics: Liberation and absorption (onset of action): Shorter than expected. Distribution (time to peak effect): Shorter than expected. Metabolism and excretion (duration of action): WNL        Compliance issue Pharmacodynamics: Desired effects: Analgesia: [name] reports >50% benefit. Functional ability: Patient reports that medication does help, but not nearly as much as he would like Clinically meaningful improvement in function (CMIF): Sustained CMIF goals met Perceived effectiveness: Described as ineffective and would like to make some changes Undesirable effects: Side-effects or Adverse reactions: None reported Monitoring: [place] PMP: Online review of the past 87-monthperiod conducted. Compliant with practice rules and regulations Last UDS on record: Summary  Date Value Ref Range Status  03/17/2017 FINAL  Final    Comment:    ==================================================================== TOXASSURE COMP DRUG ANALYSIS,UR ==================================================================== Test                             Result       Flag       Units Drug Absent but Declared for Prescription Verification   Tramadol                       Not Detected UNEXPECTED ng/mg creat   Salicylate                     Not Detected UNEXPECTED    Aspirin, as indicated in the declared medication list, is not    always detected even when used as directed. ==================================================================== Test  Result    Flag   Units      Ref Range   Creatinine              80               mg/dL      >=20 ==================================================================== Declared Medications:  The flagging and interpretation on this report are based on the  following declared medications.  Unexpected results may arise from  inaccuracies in the declared medications.  **Note: The testing scope of this panel includes these  medications:  Tramadol  **Note: The testing scope of this panel does not include small to  moderate amounts of these reported medications:  Aspirin (Aspirin 81)  **Note: The testing scope of this panel does not include following  reported medications:  Carvedilol  Cephalexin  Clopidogrel  Furosemide  Metformin  Multivitamin  Mycophenolate mofetil (Cellcept)  Nitroglycerin  Ondansetron  Oxygen  Pantoprazole  Sodium Chloride  Sulfamethoxazole  Trimethoprim ==================================================================== For clinical consultation, please call [phone]. ====================================================================    UDS interpretation: Compliant          Medication Assessment Form: Reviewed. Patient indicates being compliant with therapy Treatment compliance: Compliant Risk Assessment Profile: Aberrant behavior: See prior evaluations. None observed or detected today Comorbid factors increasing risk of overdose: See prior notes. No additional risks detected today Risk of substance use disorder (SUD): Low Opioid Risk Tool - 09/12/17 1144      Family History of Substance Abuse   Alcohol  Negative    Illegal Drugs  Negative    Rx Drugs  Negative      Personal History of Substance Abuse   Alcohol  Negative    Illegal Drugs  Negative    Rx Drugs  Negative      Psychological Disease   Psychological Disease  Negative    Depression  Negative      Total Score   Opioid Risk Tool Scoring  0    Opioid Risk Interpretation  Low Risk      ORT Scoring interpretation table:  Score <3 = Low Risk for SUD  Score between 4-7 = Moderate Risk for SUD  Score >8 = High Risk for Opioid Abuse   Risk Mitigation Strategies:  Patient Counseling: Covered Patient-Prescriber Agreement (PPA): Present and active  Notification to other healthcare providers: Done  Pharmacologic Plan: No change in therapy, at this time.             Laboratory Chemistry   Inflammation Markers (CRP: Acute Phase) (ESR: Chronic Phase) Lab Results  Component Value Date   ESRSEDRATE 38 (H) 11/22/2011                         Rheumatology Markers Lab Results  Component Value Date   RF 23 (H) 11/22/2011   ANA NEG 11/22/2011                        Renal Function Markers Lab Results  Component Value Date   BUN 29 (H) 10/17/2017   CREATININE 2.47 (H) 10/17/2017   BCR 13 06/24/2017   GFRAA 30 (L) 06/24/2017   GFRNONAA 26 (L) 06/24/2017                             Hepatic Function Markers Lab Results  Component Value Date   AST 17 06/24/2017   ALT 10 06/24/2017  ALBUMIN 4.1 10/17/2017   ALKPHOS 67 04/29/2017                        Electrolytes Lab Results  Component Value Date   NA 138 10/17/2017   K 4.7 10/17/2017   CL 97 10/17/2017   CALCIUM 9.8 10/17/2017   MG 1.9 04/08/2017   PHOS 3.5 10/17/2017                        Neuropathy Markers Lab Results  Component Value Date   VITAMINB12 253 08/10/2016   FOLATE 17.6 08/10/2016   HGBA1C 8.1 (H) 10/17/2017                        Bone Pathology Markers No results found for: VD25OH, [id number], [id number], GN5621HY8, 25OHVITD1, 25OHVITD2, 25OHVITD3, TESTOFREE, TESTOSTERONE                       Coagulation Parameters Lab Results  Component Value Date   INR 1.12 08/13/2015   LABPROT 14.6 08/13/2015   APTT 29 08/13/2015   PLT 201 06/24/2017                        Cardiovascular Markers Lab Results  Component Value Date   BNP 318.4 (H) 08/13/2015   CKTOTAL 73 12/31/2011   CKMB 3.4 12/31/2011   TROPONINI 0.68 (HH) 08/14/2015   HGB 12.0 (L) 06/24/2017   HCT 38.2 (L) 06/24/2017                         CA Markers No results found for: CEA, CA125, LABCA2                      Note: Lab results reviewed.  Recent Diagnostic Imaging Results  Cardiac event monitor SR isolated PAC;s and PVC;s  No significant arrhythmias  Complexity Note: Imaging results reviewed. Results shared  with [name], using Layman's terms.                         Meds   Current Outpatient Medications:  .  AMITIZA 24 MCG capsule, Take 2 capsules by mouth daily., Disp: , Rfl:  .  aspirin EC 81 MG tablet, Take 81 mg by mouth at bedtime., Disp: , Rfl:  .  Blood Glucose Monitoring Suppl (ONE TOUCH ULTRA 2) w/Device KIT, Use to obtain blood sugar daily. Dx Code E11.40, Disp: 1 each, Rfl: 0 .  carvedilol (COREG) 3.125 MG tablet, Take 1 tablet (3.125 mg total) by mouth 2 (two) times daily with a meal., Disp: 180 tablet, Rfl: 3 .  cholecalciferol (VITAMIN D) 1000 units tablet, Take 2,000 Units by mouth daily. , Disp: , Rfl:  .  clopidogrel (PLAVIX) 75 MG tablet, TAKE 1 TABLET BY MOUTH DAILY WITH BREAKFAST., Disp: 30 tablet, Rfl: 10 .  DHA-Vitamin C-Lutein (EYE HEALTH FORMULA PO), Take 1 tablet by mouth daily. , Disp: , Rfl:  .  ferrous sulfate 324 (65 Fe) MG TBEC, Take 1 tablet by mouth daily., Disp: , Rfl:  .  furosemide (LASIX) 40 MG tablet, TAKE 1 TO 2 TABLETS BY MOUTH EVERY DAY, Disp: 60 tablet, Rfl: 11 .  glucose blood (ONE TOUCH ULTRA TEST) test strip, USE TO CHECK BLOOD SUGAR ONCE A DAY Dx Code E11.40, Disp: 100 each,  Rfl: 3 .  [START ON 10/31/2017] HYDROcodone-acetaminophen (NORCO) 10-325 MG tablet, Take 1-2 tablets by mouth every 4 (four) hours as needed., Disp: 180 tablet, Rfl: 0 .  nitroGLYCERIN (NITROSTAT) 0.4 MG SL tablet, DISSOLVE 1 TABLET UNDER THE TONGUE FOR CHEST PAIN. MAY REPEAT EVERY 5MINUTES UP TO 3 DOSES. IF NO RELIEF, CALL 911**, Disp: 25 tablet, Rfl: 5 .  ondansetron (ZOFRAN) 4 MG tablet, TAKE 1 TABLET BY MOUTH EVERY 8 HOURS AS NEEDED FOR NAUSEA OR VOMITING, Disp: 60 tablet, Rfl: 0 .  ONETOUCH DELICA LANCETS 40G MISC, 1 each by In Vitro route daily. Dx Code E11.49, Disp: 100 each, Rfl: 3 .  OXYGEN, Inhale into the lungs. Per pt- Uses Oxygen 2 liters at night., Disp: , Rfl:  .  pantoprazole (PROTONIX) 40 MG tablet, TAKE 1 TABLET BY MOUTH TWICE (2) DAILY, Disp: 60 tablet, Rfl:  11  ROS  Constitutional: Denies any fever or chills Gastrointestinal: No reported hemesis, hematochezia, vomiting, or acute GI distress Musculoskeletal: Denies any acute onset joint swelling, redness, loss of ROM, or weakness Neurological: No reported episodes of acute onset apraxia, aphasia, dysarthria, agnosia, amnesia, paralysis, loss of coordination, or loss of consciousness  Allergies  [name] is allergic to doxazosin mesylate and methocarbamol.  [place]  Drug: [name]  reports that he does not use drugs. Alcohol:  reports that he does not drink alcohol. Tobacco:  reports that he quit smoking about 54 years ago. His smoking use included cigarettes. He has a 20.00 pack-year smoking history. He has never used smokeless tobacco. Medical:  has a past medical history of Arthritis, CAD (coronary artery disease), Chronic diastolic CHF (congestive heart failure) ([place]), Chronic kidney disease, stage III (moderate) ([place]), Chronic lower back pain, Colon polyps, Diverticulosis, Dyspnea (2009 since July -Sept), Enlarged prostate, Esophageal stricture, GERD (gastroesophageal reflux disease), Heart murmur, Hiatal hernia, History of kidney stones, History of PFTs, Hyperlipidemia, Hypertension, Interstitial lung disease ([place]), Nausea & vomiting, Overweight (BMI 25.0-29.9), RA (rheumatoid arthritis) ([place]), Seropositive rheumatoid arthritis ([place]), and Type II diabetes mellitus ([place]). Surgical: [name]  has a past surgical history that includes Total knee arthroplasty (Right, 03/2010); Knee arthroscopy (Right, 2008); Cataract extraction w/ intraocular lens  implant, bilateral (Bilateral); Coronary artery bypass graft (11/1999); Coronary stent placement (02/2012); Shoulder arthroscopy with open rotator cuff repair and distal clavicle acrominectomy (Left, 02/27/2013); Trigger finger release (Left, 02/27/2013); Cholecystectomy open (11/2003); Joint replacement; Esophagogastroduodenoscopy (egd) with esophageal  dilation (2010); Cardiac catheterization (08/2004); Cardiac catheterization (12/31/2011); Coronary angioplasty with stent (01/03/2012); Coronary angioplasty with stent (01/14/2014); Cardiac catheterization (2009); left and right heart catheterization with coronary angiogram (12/31/2011); percutaneous coronary stent intervention (pci-s) (12/31/2011); percutaneous coronary stent intervention (pci-s) (N/A, 01/03/2012); Percutaneous coronary intervention-balloon only (01/03/2012); left and right heart catheterization with coronary angiogram (N/A, 01/14/2014); Hand surgery; Cardiac catheterization (N/A, 08/13/2015); Esophagogastroduodenoscopy (N/A, 03/01/2017); and maloney dilation (03/01/2017). Family: family history includes Alcohol abuse in his sister; COPD in his mother; Colon cancer (age of onset: 57) in his brother; Diabetes in his brother; Heart attack in his father; Heart disease in his father; Stroke in his sister.  Constitutional Exam  General appearance: Well nourished, well developed, and well hydrated. In no apparent acute distress Vitals:   10/26/17 1049  BP: (!) 150/83  Pulse: 63  Resp: 16  Temp: 97.6 F (36.4 C)  TempSrc: Oral  SpO2: 94%  Weight: 198 lb (89.8 kg)  Height: _0  (1.854 m)   BMI Assessment: Estimated body mass index is 26.12 kg/m as calculated from the  following:   Height as of this encounter: '6\' 1"'$  (1.854 m).   Weight as of this encounter: 198 lb (89.8 kg). Psych/Mental status: Alert, oriented x 3 (person, place, & time)       Eyes: Left eye distorition Respiratory: No evidence of acute respiratory distress   Gait & Posture Assessment  Ambulation: Unassisted Gait: Relatively normal for age and body habitus Posture: WNL   Lower Extremity Exam    Side: Right lower extremity  Side: Left lower extremity  Stability: No instability observed          Stability: No instability observed          Skin & Extremity Inspection:  Mild edema  Skin & Extremity Inspection: mild  edema  Functional ROM: Unrestricted ROM                  Functional ROM: Unrestricted ROM                  Muscle Tone/Strength: Functionally intact. No obvious neuro-muscular anomalies detected.  Muscle Tone/Strength: Functionally intact. No obvious neuro-muscular anomalies detected.  Sensory (Neurological): Neuropathic pain pattern  Sensory (Neurological): Neuropathic pain pattern  Palpation: No palpable anomalies  Palpation: No palpable anomalies   Assessment  Primary Diagnosis & Pertinent Problem List: The primary encounter diagnosis was Chronic pain of both lower extremities. Diagnoses of Neuropathic pain, Chronic pain syndrome, and Long term prescription opiate use were also pertinent to this visit.  Status Diagnosis  Worsening Worsening Persistent 1. Chronic pain of both lower extremities   2. Neuropathic pain   3. Chronic pain syndrome   4. Long term prescription opiate use     Problems updated and reviewed during this visit: Problem  Chronic Pain of Both Lower Extremities  Neuropathic Pain  Chronic Pain Syndrome  Iron Deficiency Anemia  Constipation  Ckd (Chronic Kidney Disease) Stage 4, Gfr 15-29 Ml/Min ([name])   Overview:  Last Assessment & Plan:  Recent GFR down a little   Seropositive Rheumatoid Arthritis ([name])   +RF +CCP  Overview:  Overview:  +RF +CCP  Last Assessment & Plan:  Seeing [name] Likely the cause of ILD as well On cellcept   DM (Diabetes Mellitus) Type II Controlled, Neurological Manifestation ([name])   Overview:  Last Assessment & Plan:  Not a priority with everything else Goal is A1c under 9% Ongoing recalcitrant neuropathy--now at pain clinic   Atherosclerotic Heart Disease of Native Coronary Artery With Angina Pectoris ([name])   a. s/p CABG (2001)  b. s/p DES to RCA and cutting POBA to ostial PDA (2013)   c. s/p DES to SVG to OM2 (01/14/14) Overview:  Overview:  a. s/p CABG (2001)  b. s/p DES to RCA and cutting POBA to ostial PDA  (2013)   c. s/p DES to SVG to OM2 (01/14/14)  Last Assessment & Plan:  Unclear if DOE/chest pain is pulmonary or cardiac No med changes for now Rarely uses the nitro   Dysphagia  Orthostatic Hypotension   Overview:  Last Assessment & Plan:  Better off isosorbide and on cutting carvedilol No new angina Will continue these meds  Has left chest pain--but seems to be rib related   Diarrhea   Overview:  Last Assessment & Plan:  Unclear source. This could be infectious. He is nontoxic. The diarrhea is getting some better. Would still recheck routine labs today. See notes on labs.  Unclear if this is related to the Cymbalta. He has stopped the  medication in the meantime. Unclear if he was getting better on his on or if he is getting better from stopping the medication. Discussed with patient.  He has been lightheaded in the meantime. Skip the amlodipine for now, until he is not lightheaded. He'll update [place] about BP in about 1 week, sooner if needed.  Okay to cut the metformin back to 1 pill a day for now and see if that helps the diarrhea.   At this point still okay for outpatient follow-up. See notes on labs.   Tegretol-Induced Dizziness   Overview:  Last Assessment & Plan:  Most likely new symptoms are due to carbamazepine.  Pt will stop and restart lyrica.  Recent labs stable and no current evidence of infection or nrew cardiopulmonary issue.   Respiratory Failure, Chronic ([name])   Overview:  Last Assessment & Plan:  Still uses the oxygen at night Hasn't needed in day recently   Rheumatoid Arthritis ([name])   Overview:  Last Assessment & Plan:  Seems to be controlled with the azathioprine Overview:  Overview:  Last Assessment & Plan:  Seems to be controlled with the azathioprine   Rheumatic Fever Without Heart Involvement  Intermediate Coronary Syndrome ([name])  Ventral Hernia   Overview:  Last Assessment & Plan:  At lower edge of gallbladder scar No  obstruction Discussed using velcro binder for support Overview:  Overview:  Last Assessment & Plan:  At lower edge of gallbladder scar No obstruction Discussed using velcro binder for support  Last Assessment & Plan:  At lower edge of gallbladder scar No obstruction Discussed using velcro binder for support   Routine History and Physical Examination of Adult   Overview:  Last Assessment & Plan:  I have personally reviewed the Medicare Annual Wellness questionnaire and have noted 1. The patient's medical and social history 2. Their use of alcohol, tobacco or illicit drugs 3. Their current medications and supplements 4. The patient's functional ability including ADL's, fall risks, home safety risks and hearing or visual             impairment. 5. Diet and physical activities 6. Evidence for depression or mood disorders  The patients weight, height, BMI and visual acuity have been recorded in the chart I have made referrals, counseling and provided education to the patient based review of the above and I have provided the pt with a written personalized care plan for preventive services.  I have provided you with a copy of your personalized plan for preventive services. Please take the time to review along with your updated medication list.  No cancer screening due to age No zostavax due to azathioprine Yearly flu vaccine   ILD (interstitial lung disease) ([place])   Overview:  Last Assessment & Plan:  Not sure if this is the cause of dyspnea  Does use oxygen at night Overview:  Last Assessment & Plan:  He doesn't feel he is any better on the immurnosuppressant Keeps up with specialists  Overview:  Last Assessment & Plan:  Not sure if this is the cause of dyspnea  Does use oxygen at night   Chronic diastolic heart failure ([name])   Overview:  Last Assessment & Plan:  Ongoing dyspnea which is not clearly cardiac Trace edema but no evidence of fluid overload Overview:   Overview:  Last Assessment & Plan:  Ongoing dyspnea which is not clearly cardiac Trace edema but no evidence of fluid overload  Last Assessment & Plan:  This seems to be  compensated No med changes indicated   Nausea   Overview:  Last Assessment & Plan:  Persists Awaiting GI evaluation   Type 2 Diabetes Mellitus With Diabetic Polyneuropathy ([name])   Overview:  Last Assessment & Plan:  Ongoing pain in feet despite the gabapentin No changes for now   Obstructive Sleep Apnea      Overview:  Overview:  Overview:   Last Assessment & Plan:  He has history of mild sleep apnea. He does not feel that his sleep is a major problem at present.  I have asked him to monitor his sleep pattern.  He can follow up with me as needed if his sleep becomes more of an issue.  Overview:   Last Assessment & Plan:  He has history of mild sleep apnea. He does not feel that his sleep is a major problem at present.  I have asked him to monitor his sleep pattern.  He can follow up with me as needed if his sleep becomes more of an issue.  Overview:  Overview:   Last Assessment & Plan:  He has history of mild sleep apnea. He does not feel that his sleep is a major problem at present.  I have asked him to monitor his sleep pattern.  He can follow up with me as needed if his sleep becomes more of an issue.   Esophageal Stricture   Overview:  Overview:  Qualifier: Diagnosis of  By: Henrene Pastor MD, Docia Chuck  Last Assessment & Plan:  Some dysphagia Needs to stay on the PPI Overview:  Overview:  Qualifier: Diagnosis of  By: Henrene Pastor MD, Docia Chuck  Last Assessment & Plan:  Some dysphagia Needs to stay on the PPI  Last Assessment & Plan:  Some ongoing dysphagia but better since dilation   Diverticulosis of Colon   Qualifier: Diagnosis of  By: Shane Crutch, Amy S   Overview:  Overview:  Qualifier: Diagnosis of  By: Shane Crutch, Amy S Overview:  Overview:  Overview:  Qualifier: Diagnosis of   By: Shane Crutch, Amy S   Gastro-Esophageal Reflux Disease With Esophagitis   Overview:  Overview:  Qualifier: Diagnosis of  By: Sharol Roussel  Overview:  Overview:  Overview:  Qualifier: Diagnosis of  By: Laney Potash, Pam   Overview:  Qualifier: Diagnosis of  By: Laney Potash, Pam    Coronary Atherosclerosis   10/31/13 2 D echo showed Nml EF 60%, Gr 1 DD , mild aortic stenosis (f/by cards - prev echo 2013 similar)   Overview:  Overview:  a. s/p CABG (2001)  b. s/p DES to RCA and cutting POBA to ostial PDA (2013)   c. s/p DES to SVG to OM2 (01/14/14)  Last Assessment & Plan:  Gets chest pain along with dyspnea with exertion Not clearly ischemic though Some concern about chronotropic insufficiency--but I would not cut the carvedilol till evaluation with [facility] statin, ARB, asa and plavix Overview:  Overview:  Overview:  a. s/p CABG (2001)  b. s/p DES to RCA and cutting POBA to ostial PDA (2013)   c. s/p DES to SVG to OM2 (01/14/14)  Last Assessment & Plan:  Gets chest pain along with dyspnea with exertion Not clearly ischemic though Some concern about chronotropic insufficiency--but I would not cut the carvedilol till evaluation with [facility] On statin, ARB, asa and plavix   Aortic Valve Disorder   Overview:  Overview:  10/31/13 2 D echo showed Nml EF 60%, Gr 1 DD , mild aortic  stenosis (f/by cards - prev echo 2013 similar)   Last Assessment & Plan:  Mild no change in murmur  F/U echo in a year or change in symptoms  Overview:  Overview:  Overview:  10/31/13 2 D echo showed Nml EF 60%, Gr 1 DD , mild aortic stenosis (f/by cards - prev echo 2013 similar)   Last Assessment & Plan:  Mild no change in murmur  F/U echo in a year or change in symptoms    Thoracic Aorta Atherosclerosis ([name])   Overview:  Overview:  10/31/13 2 D echo showed Nml EF 60%, Gr 1 DD , mild aortic stenosis (f/by cards - prev echo 2013 similar)   Last Assessment & Plan:  Seen on CT  scan Already on antiplatelet and statin   Actinic Keratosis   Qualifier: Diagnosis of  By: Silvio Pate MD, [facility] Cancer   Overview:  Overview:  Qualifier: Diagnosis of  By: Silvio Pate MD, [facility] Overview:  Overview:  Overview:  Qualifier: Diagnosis of  By: Silvio Pate MD, [facility]  Overview:  Qualifier: Diagnosis of  By: Silvio Pate MD, [facility]   Disturbance in Sleep Behavior   Overview:  Overview:  Qualifier: Diagnosis of  By: Silvio Pate MD, [facility]   Familial Multiple Lipoprotein-Type Hyperlipidemia   Qualifier: Diagnosis of  By: Lelon Mast    Overview:  Overview:  Qualifier: Diagnosis of  By: Lelon Mast   Last Assessment & Plan:  No problems with statin Due for labs Overview:  Overview:  Overview:  Qualifier: Diagnosis of  By: Lelon Mast   Last Assessment & Plan:  No problems with statin Due for labs   Essential Hypertension   Qualifier: Diagnosis of  By: Lelon Mast    Overview:  Overview:  Qualifier: Diagnosis of  By: Lelon Mast   Last Assessment & Plan:  Well controlled.  Continue current medications and low sodium Dash type diet.   Overview:  Overview:  Overview:  Qualifier: Diagnosis of  By: Lelon Mast   Last Assessment & Plan:  Well controlled.  Continue current medications and low sodium Dash type diet.    Overview:  Qualifier: Diagnosis of  By: Lelon Mast   Last Assessment & Plan:  Formatting of this note might be different from the original. 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)   Overview:  Overview:  Qualifier: Diagnosis of  By: Lelon Mast   Last Assessment & Plan:  Mild symptoms Mild enlargement via ultrasound Discussed that we don't want to evaluate this any further (was apparently homogeneous but I don't want to know if he has cancer--Rx more dangerous than the disease) Overview:  Overview:  Qualifier: Diagnosis of  By: Lelon Mast   Last Assessment & Plan:  Mild symptoms Mild enlargement via ultrasound Discussed that we don't want to evaluate this any further (was apparently homogeneous but I don't want to know if he has cancer--Rx more dangerous than the disease)   Hld (Hyperlipidemia)   Overview:  Overview:  Qualifier: Diagnosis  of  By: Lelon Mast   Last Assessment & Plan:  No problems with statin Due for labs    Plan of Care  Pharmacotherapy (Medications Ordered): Meds ordered this encounter  Medications  . HYDROcodone-acetaminophen (NORCO) 10-325 MG tablet    Sig: Take 1-2 tablets by mouth every 4 (four) hours as needed.    Dispense:  180 tablet    Refill:  0    For chronic pain 1-2 tablets q4 hrs prn, not to exceed 6 tablets a day To last for 30 days from fill date May fill: 10/31/2017 To last until:11/30/2017    Order Specific Question:   Supervising Provider    Answer:   Milinda Pointer [phone]   New Prescriptions   No medications on file   Medications administered today: [name] had no medications administered during this visit. Lab-work, procedure(s), and/or referral(s): Orders Placed This Encounter  Procedures  . Compliance Drug Analysis, Ur   Imaging and/or referral(s): None  Interventional therapies: Planned, scheduled, and/or pending:   Not at this time. No change in current treatment regimen. Diabetes needs to be treated to help with PN     Provider-requested follow-up: Return in about 1 month (around 11/23/2017) for w/ Dr. [name].  Future Appointments   Date Time Provider [place]  11/21/2017  1:45 PM Gillis Santa, MD [facility] None  01/10/2018 11:30 AM [id number]  04/28/2018 11:30 AM [name], MD [facility]   Primary Care Physician: [name], MD Location: [facility] Note by: Vevelyn Francois NP Date: 10/26/2017; Time: 4:58 PM  Pain Score Disclaimer: We use the NRS-11 scale. This is a self-reported, subjective measurement of pain severity with only modest accuracy. It is used primarily to identify changes within a particular patient. It must be understood that outpatient pain scales are significantly less accurate that those used for research, where they can be applied under ideal controlled circumstances with minimal exposure to variables. In reality, the score is likely to be a combination of pain intensity and pain affect, where pain affect describes the degree of emotional arousal or changes in action readiness caused by the sensory experience of pain. Factors such as social and work situation, setting, emotional state, anxiety levels, expectation, and prior pain experience may influence pain perception and show large inter-individual differences that may also be affected by time variables.  Patient instructions provided during this appointment: Patient Instructions  ____________________________________________________________________________________________  Medication Rules  Applies to: All patients receiving prescriptions (written or electronic).  Pharmacy of record: Pharmacy where electronic prescriptions will be sent. If written prescriptions are taken to a different pharmacy, please inform the nursing staff. The pharmacy listed in the electronic medical record should be the one where you would like electronic prescriptions to be sent.  Prescription refills: Only during scheduled appointments. Applies to both, written and electronic  prescriptions.  NOTE: The following applies primarily to controlled substances (Opioid* Pain Medications).   Patient's responsibilities: 1. Pain Pills: Bring all pain pills to every appointment (except for procedure appointments). 2. Pill Bottles: Bring pills in original pharmacy bottle. Always bring newest bottle. Bring bottle, even if empty. 3. Medication refills: You are responsible for knowing and keeping track of what medications you need refilled. The day before your appointment, write a list of all prescriptions that need to be refilled. Bring that list to your appointment and give it to the admitting nurse. Prescriptions will be written only during appointments.  If you forget a medication, it will not be "Called in", "Faxed", or "electronically sent". You will need to get another appointment to get these prescribed. 4. Prescription Accuracy: You are responsible for carefully inspecting your prescriptions before leaving our office. Have the discharge nurse carefully go over each prescription with you, before taking them home. Make sure that your name is accurately spelled, that your address is correct. Check the name and dose of your medication to make sure it is accurate. Check the number of pills, and the written instructions to make sure they are clear and accurate. Make sure that you are given enough medication to last until your next medication refill appointment. 5. Taking Medication: Take medication as prescribed. Never take more pills than instructed. Never take medication more frequently than prescribed. Taking less pills or less frequently is permitted and encouraged, when it comes to controlled substances (written prescriptions).  6. Inform other Doctors: Always inform, all of your healthcare providers, of all the medications you take. 7. Pain Medication from other Providers: You are not allowed to accept any additional pain medication from any other Doctor or Healthcare provider. There  are two exceptions to this rule. (see below) In the event that you require additional pain medication, you are responsible for notifying us, as stated below. 8. Medication Agreement: You are responsible for carefully reading and following our Medication Agreement. This must be signed before receiving any prescriptions from our practice. Safely store a copy of your signed Agreement. Violations to the Agreement will result in no further prescriptions. (Additional copies of our Medication Agreement are available upon request.) 9. Laws, Rules, & Regulations: All patients are expected to follow all Federal and [employer], [employer], Rules, [employer]. Ignorance of the Laws does not constitute a valid excuse. The use of any illegal substances is prohibited. 10. Adopted CDC guidelines & recommendations: Target dosing levels will be at or below 60 MME/day. Use of benzodiazepines** is not recommended.  Exceptions: There are only two exceptions to the rule of not receiving pain medications from other Healthcare Providers. 1. Exception #1 (Emergencies): In the event of an emergency (i.e.: accident requiring emergency care), you are allowed to receive additional pain medication. However, you are responsible for: As soon as you are able, call our office [phone], at any time of the day or night, and leave a message stating your name, the date and nature of the emergency, and the name and dose of the medication prescribed. In the event that your call is answered by a member of our staff, make sure to document and save the date, time, and the name of the person that took your information.  2. Exception #2 (Planned Surgery): In the event that you are scheduled by another doctor or dentist to have any type of surgery or procedure, you are allowed (for a period no longer than 30 days), to receive additional pain medication, for the acute post-op pain. However, in this case, you are responsible for picking up a copy of  our "Post-op Pain Management for Surgeons" handout, and giving it to your surgeon or dentist. This document is available at our office, and does not require an appointment to obtain it. Simply go to our office during business hours (Monday-Thursday from 8:00 AM to 4:00 PM) (Friday 8:00 AM to 12:00 Noon) or if you have a scheduled appointment with [place], prior to your surgery, and ask for it by name. In addition, you will need to provide [place] with your  name, name of your surgeon, type of surgery, and date of procedure or surgery.  *Opioid medications include: morphine, codeine, oxycodone, oxymorphone, hydrocodone, hydromorphone, meperidine, tramadol, tapentadol, buprenorphine, fentanyl, methadone. **Benzodiazepine medications include: diazepam (Valium), alprazolam (Xanax), clonazepam (Klonopine), lorazepam (Ativan), clorazepate (Tranxene), chlordiazepoxide (Librium), estazolam (Prosom), oxazepam (Serax), temazepam (Restoril), triazolam (Halcion) (Last updated: 06/02/2017) ____________________________________________________________________________________________

## 2017-10-31 LAB — COMPLIANCE DRUG ANALYSIS, UR

## 2017-11-15 DIAGNOSIS — J8410 Pulmonary fibrosis, unspecified: Secondary | ICD-10-CM | POA: Diagnosis not present

## 2017-11-15 DIAGNOSIS — R0689 Other abnormalities of breathing: Secondary | ICD-10-CM | POA: Diagnosis not present

## 2017-11-15 DIAGNOSIS — D509 Iron deficiency anemia, unspecified: Secondary | ICD-10-CM | POA: Diagnosis not present

## 2017-11-15 DIAGNOSIS — R1310 Dysphagia, unspecified: Secondary | ICD-10-CM | POA: Diagnosis not present

## 2017-11-17 ENCOUNTER — Telehealth: Payer: Self-pay | Admitting: Internal Medicine

## 2017-11-17 NOTE — Telephone Encounter (Signed)
Pt called to check on status of diabetic shoe form that was given to Dr. Silvio Pate during 10/17/17 OV. He is requesting a cb.

## 2017-11-17 NOTE — Telephone Encounter (Signed)
Tried to call pt. VM not set up. We faxed it along with the [place] note after his appointment.

## 2017-11-21 ENCOUNTER — Ambulatory Visit
Payer: Medicare Other | Attending: Student in an Organized Health Care Education/Training Program | Admitting: Student in an Organized Health Care Education/Training Program

## 2017-11-21 ENCOUNTER — Encounter: Payer: Self-pay | Admitting: Student in an Organized Health Care Education/Training Program

## 2017-11-21 ENCOUNTER — Other Ambulatory Visit: Payer: Self-pay

## 2017-11-21 VITALS — BP 159/83 | HR 61 | Temp 97.5°F | Resp 16 | Ht 73.0 in | Wt 200.0 lb

## 2017-11-21 DIAGNOSIS — K5730 Diverticulosis of large intestine without perforation or abscess without bleeding: Secondary | ICD-10-CM | POA: Insufficient documentation

## 2017-11-21 DIAGNOSIS — E1142 Type 2 diabetes mellitus with diabetic polyneuropathy: Secondary | ICD-10-CM

## 2017-11-21 DIAGNOSIS — E782 Mixed hyperlipidemia: Secondary | ICD-10-CM | POA: Diagnosis not present

## 2017-11-21 DIAGNOSIS — Z9889 Other specified postprocedural states: Secondary | ICD-10-CM | POA: Insufficient documentation

## 2017-11-21 DIAGNOSIS — Z800 Family history of malignant neoplasm of digestive organs: Secondary | ICD-10-CM | POA: Insufficient documentation

## 2017-11-21 DIAGNOSIS — Z951 Presence of aortocoronary bypass graft: Secondary | ICD-10-CM | POA: Insufficient documentation

## 2017-11-21 DIAGNOSIS — R197 Diarrhea, unspecified: Secondary | ICD-10-CM | POA: Insufficient documentation

## 2017-11-21 DIAGNOSIS — Z955 Presence of coronary angioplasty implant and graft: Secondary | ICD-10-CM | POA: Insufficient documentation

## 2017-11-21 DIAGNOSIS — Z5181 Encounter for therapeutic drug level monitoring: Secondary | ICD-10-CM | POA: Insufficient documentation

## 2017-11-21 DIAGNOSIS — K5900 Constipation, unspecified: Secondary | ICD-10-CM | POA: Insufficient documentation

## 2017-11-21 DIAGNOSIS — I25110 Atherosclerotic heart disease of native coronary artery with unstable angina pectoris: Secondary | ICD-10-CM | POA: Diagnosis not present

## 2017-11-21 DIAGNOSIS — G4733 Obstructive sleep apnea (adult) (pediatric): Secondary | ICD-10-CM | POA: Insufficient documentation

## 2017-11-21 DIAGNOSIS — M79604 Pain in right leg: Secondary | ICD-10-CM | POA: Diagnosis not present

## 2017-11-21 DIAGNOSIS — H6120 Impacted cerumen, unspecified ear: Secondary | ICD-10-CM | POA: Insufficient documentation

## 2017-11-21 DIAGNOSIS — M48061 Spinal stenosis, lumbar region without neurogenic claudication: Secondary | ICD-10-CM | POA: Diagnosis not present

## 2017-11-21 DIAGNOSIS — Z7982 Long term (current) use of aspirin: Secondary | ICD-10-CM | POA: Insufficient documentation

## 2017-11-21 DIAGNOSIS — G894 Chronic pain syndrome: Secondary | ICD-10-CM | POA: Diagnosis not present

## 2017-11-21 DIAGNOSIS — Z87891 Personal history of nicotine dependence: Secondary | ICD-10-CM | POA: Insufficient documentation

## 2017-11-21 DIAGNOSIS — Z8249 Family history of ischemic heart disease and other diseases of the circulatory system: Secondary | ICD-10-CM | POA: Insufficient documentation

## 2017-11-21 DIAGNOSIS — I130 Hypertensive heart and chronic kidney disease with heart failure and stage 1 through stage 4 chronic kidney disease, or unspecified chronic kidney disease: Secondary | ICD-10-CM | POA: Diagnosis not present

## 2017-11-21 DIAGNOSIS — K439 Ventral hernia without obstruction or gangrene: Secondary | ICD-10-CM | POA: Diagnosis not present

## 2017-11-21 DIAGNOSIS — K222 Esophageal obstruction: Secondary | ICD-10-CM | POA: Insufficient documentation

## 2017-11-21 DIAGNOSIS — M47816 Spondylosis without myelopathy or radiculopathy, lumbar region: Secondary | ICD-10-CM | POA: Insufficient documentation

## 2017-11-21 DIAGNOSIS — E1122 Type 2 diabetes mellitus with diabetic chronic kidney disease: Secondary | ICD-10-CM | POA: Diagnosis not present

## 2017-11-21 DIAGNOSIS — Z96651 Presence of right artificial knee joint: Secondary | ICD-10-CM | POA: Insufficient documentation

## 2017-11-21 DIAGNOSIS — K219 Gastro-esophageal reflux disease without esophagitis: Secondary | ICD-10-CM | POA: Insufficient documentation

## 2017-11-21 DIAGNOSIS — I358 Other nonrheumatic aortic valve disorders: Secondary | ICD-10-CM | POA: Insufficient documentation

## 2017-11-21 DIAGNOSIS — J849 Interstitial pulmonary disease, unspecified: Secondary | ICD-10-CM | POA: Insufficient documentation

## 2017-11-21 DIAGNOSIS — N183 Chronic kidney disease, stage 3 (moderate): Secondary | ICD-10-CM | POA: Diagnosis not present

## 2017-11-21 DIAGNOSIS — Z79899 Other long term (current) drug therapy: Secondary | ICD-10-CM | POA: Insufficient documentation

## 2017-11-21 DIAGNOSIS — Z79891 Long term (current) use of opiate analgesic: Secondary | ICD-10-CM | POA: Diagnosis not present

## 2017-11-21 DIAGNOSIS — Z811 Family history of alcohol abuse and dependence: Secondary | ICD-10-CM | POA: Insufficient documentation

## 2017-11-21 DIAGNOSIS — N400 Enlarged prostate without lower urinary tract symptoms: Secondary | ICD-10-CM | POA: Insufficient documentation

## 2017-11-21 DIAGNOSIS — G629 Polyneuropathy, unspecified: Secondary | ICD-10-CM

## 2017-11-21 DIAGNOSIS — I5032 Chronic diastolic (congestive) heart failure: Secondary | ICD-10-CM | POA: Diagnosis not present

## 2017-11-21 DIAGNOSIS — I700 Atherosclerosis of aorta: Secondary | ICD-10-CM | POA: Insufficient documentation

## 2017-11-21 DIAGNOSIS — G8929 Other chronic pain: Secondary | ICD-10-CM

## 2017-11-21 DIAGNOSIS — M792 Neuralgia and neuritis, unspecified: Secondary | ICD-10-CM | POA: Diagnosis not present

## 2017-11-21 DIAGNOSIS — Z9981 Dependence on supplemental oxygen: Secondary | ICD-10-CM | POA: Insufficient documentation

## 2017-11-21 DIAGNOSIS — L570 Actinic keratosis: Secondary | ICD-10-CM | POA: Diagnosis not present

## 2017-11-21 DIAGNOSIS — M170 Bilateral primary osteoarthritis of knee: Secondary | ICD-10-CM | POA: Insufficient documentation

## 2017-11-21 DIAGNOSIS — J9610 Chronic respiratory failure, unspecified whether with hypoxia or hypercapnia: Secondary | ICD-10-CM | POA: Insufficient documentation

## 2017-11-21 DIAGNOSIS — M069 Rheumatoid arthritis, unspecified: Secondary | ICD-10-CM | POA: Insufficient documentation

## 2017-11-21 DIAGNOSIS — I951 Orthostatic hypotension: Secondary | ICD-10-CM | POA: Diagnosis not present

## 2017-11-21 DIAGNOSIS — I00 Rheumatic fever without heart involvement: Secondary | ICD-10-CM | POA: Insufficient documentation

## 2017-11-21 DIAGNOSIS — D509 Iron deficiency anemia, unspecified: Secondary | ICD-10-CM | POA: Insufficient documentation

## 2017-11-21 DIAGNOSIS — M059 Rheumatoid arthritis with rheumatoid factor, unspecified: Secondary | ICD-10-CM

## 2017-11-21 DIAGNOSIS — M79605 Pain in left leg: Secondary | ICD-10-CM

## 2017-11-21 DIAGNOSIS — Z836 Family history of other diseases of the respiratory system: Secondary | ICD-10-CM | POA: Insufficient documentation

## 2017-11-21 MED ORDER — HYDROCODONE-ACETAMINOPHEN 10-325 MG PO TABS: 1.0000 | ORAL_TABLET | ORAL | 0 refills | Status: AC | PRN

## 2017-11-21 MED ORDER — HYDROCODONE-ACETAMINOPHEN 10-325 MG PO TABS: 1.0000 | ORAL_TABLET | ORAL | 0 refills | Status: DC | PRN

## 2017-11-21 NOTE — Progress Notes (Signed)
Patient's Name: [name]  MRN: [id number]  Referring Provider: [name], MD  DOB: 08-08-1931  PCP: [name], MD  DOS: 11/21/2017  Note by: Gillis Santa, MD  Service setting: Ambulatory outpatient  Specialty: Interventional Pain Management  Location: [facility] (AMB) Pain Management Facility    Patient type: Established   Primary Reason(s) for Visit: Encounter for prescription drug management. (Level of risk: moderate)  CC: Foot Pain (bilaterally)  HPI  [name] is a 84 y.o. year old, male patient, who comes today for a medication management evaluation. He has Familial multiple lipoprotein-type hyperlipidemia; Obstructive sleep apnea; Essential hypertension; Coronary atherosclerosis; Reflux esophagitis; ESOPHAGEAL STRICTURE; GERD; Diverticulosis of colon; BENIGN PROSTATIC HYPERTROPHY; Actinic keratosis; SLEEP DISORDER, CHRONIC; Nausea; Chronic diastolic heart failure ([name]); ILD (interstitial lung disease) ([place]); CKD (chronic kidney disease) stage 4, GFR 15-29 ml/min ([place]); Routine general medical examination at a health care facility; Seropositive rheumatoid arthritis ([place]); Ventral hernia; DM (diabetes mellitus) type II controlled, neurological manifestation ([place]); Atherosclerotic heart disease of native coronary artery with angina pectoris ([place]); Respiratory failure, chronic ([place]); Spinal stenosis of lumbar region without neurogenic claudication; Spondylosis without myelopathy or radiculopathy, lumbar region; Tegretol-induced dizziness; Diarrhea; Orthostatic hypotension; High risk medication use; Primary osteoarthritis of both knees; History of right knee joint replacement; Dysphagia; Esophageal stricture; Cerumen impaction; Aortic valve disorder; Enlarged prostate without lower urinary tract symptoms (luts); Iron deficiency anemia; Rheumatic fever without heart involvement; Type 2 diabetes mellitus with diabetic polyneuropathy ([place]); Intermediate coronary syndrome ([place]); Thoracic  aorta atherosclerosis ([employer]); Constipation; HLD (hyperlipidemia); Rheumatoid arthritis ([place]); Gastro-esophageal reflux disease with esophagitis; Routine history and physical examination of adult; Disturbance in sleep behavior; Chronic pain of both lower extremities; Neuropathic pain; and Chronic pain syndrome on their problem list. His primarily concern today is the Foot Pain (bilaterally)  Pain Assessment: Location: Right, Left Foot Radiating: into ankles Onset: More than a month ago Duration: Chronic pain Quality: Discomfort, Aching, Throbbing, Numbness Severity: 4 /10 (subjective, self-reported pain score)  Note: Reported level is compatible with observation.                         When using our objective Pain Scale, levels between 6 and 10/10 are said to belong in an emergency room, as it progressively worsens from a 6/10, described as severely limiting, requiring emergency care not usually available at an outpatient pain management facility. At a 6/10 level, communication becomes difficult and requires great effort. Assistance to reach the emergency department may be required. Facial flushing and profuse sweating along with potentially dangerous increases in heart rate and blood pressure will be evident. Effect on ADL: "i do what i need to do" Timing: Constant Modifying factors: meds help some BP: (!) 159/83  HR: 61  [name] was last scheduled for an appointment on 10/10/2017 for medication management. During today's appointment we reviewed [name] chronic pain status, as well as his outpatient medication regimen.  The patient  reports that he does not use drugs. His body mass index is 26.39 kg/m.  Further details on both, my assessment(s), as well as the proposed treatment plan, please see below.  Controlled Substance Pharmacotherapy Assessment REMS (Risk Evaluation and Mitigation Strategy)  Analgesic: Hydrocodone 10-20 mg TID as needed (maximum 6 tablets a day) MME/day: 60  mg/day.  Rise Patience, RN  11/21/2017  1:34 PM  Signed Nursing Pain Medication Assessment:  Safety precautions to be maintained throughout the outpatient stay will include: orient to surroundings,  keep bed in low position, maintain call bell within reach at all times, provide assistance with transfer out of bed and ambulation.  Medication Inspection Compliance: Pill count conducted under aseptic conditions, in front of the patient. Neither the pills nor the bottle was removed from the patient's sight at any time. Once count was completed pills were immediately returned to the patient in their original bottle.  Medication: Hydrocodone/APAP Pill/Patch Count: 58 of 180 pills remain Pill/Patch Appearance: Markings consistent with prescribed medication Bottle Appearance: Standard pharmacy container. Clearly labeled. Filled Date: 7 / 29 / 2019 Last Medication intake:  Today Pharmacokinetics: Liberation and absorption (onset of action): WNL Distribution (time to peak effect): WNL Metabolism and excretion (duration of action): WNL         Pharmacodynamics: Desired effects: Analgesia: [name] reports >50% benefit. Functional ability: Patient reports that medication allows him to accomplish basic ADLs Clinically meaningful improvement in function (CMIF): Sustained CMIF goals met Perceived effectiveness: Described as relatively effective, allowing for increase in activities of daily living (ADL) Undesirable effects: Side-effects or Adverse reactions: None reported Monitoring: [place] PMP: Online review of the past 31-monthperiod conducted. Compliant with practice rules and regulations Last UDS on record: Summary  Date Value Ref Range Status  10/26/2017 FINAL  Final    Comment:    ==================================================================== TOXASSURE COMP DRUG ANALYSIS,UR ==================================================================== Test                             Result        Flag       Units Drug Present and Declared for Prescription Verification   Hydrocodone                    2548         EXPECTED   ng/mg creat   Hydromorphone                  124          EXPECTED   ng/mg creat   Dihydrocodeine                 251          EXPECTED   ng/mg creat   Norhydrocodone                 1816         EXPECTED   ng/mg creat    Sources of hydrocodone include scheduled prescription    medications. Hydromorphone, dihydrocodeine and norhydrocodone are    expected metabolites of hydrocodone. Hydromorphone and    dihydrocodeine are also available as scheduled prescription    medications.   Acetaminophen                  PRESENT      EXPECTED Drug Absent but Declared for Prescription Verification   Salicylate                     Not Detected UNEXPECTED    Aspirin, as indicated in the declared medication list, is not    always detected even when used as directed. ==================================================================== Test                      Result    Flag   Units      Ref Range   Creatinine  87               mg/dL      >=20 ==================================================================== Declared Medications:  The flagging and interpretation on this report are based on the  following declared medications.  Unexpected results may arise from  inaccuracies in the declared medications.  **Note: The testing scope of this panel includes these medications:  Hydrocodone (Norco)  **Note: The testing scope of this panel does not include small to  moderate amounts of these reported medications:  Acetaminophen (Norco)  Aspirin (Aspirin 81)  **Note: The testing scope of this panel does not include following  reported medications:  Carvedilol (Coreg)  Clopidogrel (Plavix)  Furosemide (Lasix)  Iron (Ferrous Sulfate)  Lubiprostone (Amitiza)  Nitroglycerin (Nitrostat)  Ondansetron (Zofran)  Oxygen  Pantoprazole (Protonix)  Vitamin C  Vitamin  D ==================================================================== For clinical consultation, please call [phone]. ====================================================================    UDS interpretation: Compliant          Medication Assessment Form: Reviewed. Patient indicates being compliant with therapy Treatment compliance: Compliant Risk Assessment Profile: Aberrant behavior: See prior evaluations. None observed or detected today Comorbid factors increasing risk of overdose: See prior notes. No additional risks detected today Opioid risk tool (ORT) (Total Score):   Personal History of Substance Abuse (SUD-Substance use disorder):  Alcohol:    Illegal Drugs:    Rx Drugs:    ORT Risk Level calculation:   Risk of substance use disorder (SUD): Low  ORT Scoring interpretation table:  Score <3 = Low Risk for SUD  Score between 4-7 = Moderate Risk for SUD  Score >8 = High Risk for Opioid Abuse   Risk Mitigation Strategies:  Patient Counseling: Covered Patient-Prescriber Agreement (PPA): Present and active  Notification to other healthcare providers: Done  Pharmacologic Plan: No change in therapy, at this time.             Laboratory Chemistry  Inflammation Markers (CRP: Acute Phase) (ESR: Chronic Phase) Lab Results  Component Value Date   ESRSEDRATE 38 (H) 11/22/2011                         Rheumatology Markers Lab Results  Component Value Date   RF 23 (H) 11/22/2011   ANA NEG 11/22/2011                        Renal Function Markers Lab Results  Component Value Date   BUN 29 (H) 10/17/2017   CREATININE 2.47 (H) 10/17/2017   BCR 13 06/24/2017   GFRAA 30 (L) 06/24/2017   GFRNONAA 26 (L) 06/24/2017                             Hepatic Function Markers Lab Results  Component Value Date   AST 17 06/24/2017   ALT 10 06/24/2017   ALBUMIN 4.1 10/17/2017   ALKPHOS 67 04/29/2017                        Electrolytes Lab Results  Component Value Date    NA 138 10/17/2017   K 4.7 10/17/2017   CL 97 10/17/2017   CALCIUM 9.8 10/17/2017   MG 1.9 04/08/2017   PHOS 3.5 10/17/2017                        Neuropathy Markers Lab Results  Component  Value Date   VITAMINB12 253 08/10/2016   FOLATE 17.6 08/10/2016   HGBA1C 8.1 (H) 10/17/2017                        Bone Pathology Markers No results found for: VD25OH, DU202RK2HCW, CB7628BT5, VV6160VP7, 25OHVITD1, 25OHVITD2, 25OHVITD3, TESTOFREE, TESTOSTERONE                       Coagulation Parameters Lab Results  Component Value Date   INR 1.12 08/13/2015   LABPROT 14.6 08/13/2015   APTT 29 08/13/2015   PLT 201 06/24/2017                        Cardiovascular Markers Lab Results  Component Value Date   BNP 318.4 (H) 08/13/2015   CKTOTAL 73 12/31/2011   CKMB 3.4 12/31/2011   TROPONINI 0.68 (HH) 08/14/2015   HGB 12.0 (L) 06/24/2017   HCT 38.2 (L) 06/24/2017                         CA Markers No results found for: CEA, CA125, LABCA2                      Note: Lab results reviewed.  Recent Diagnostic Imaging Results  Cardiac event monitor SR isolated PAC;s and PVC;s  No significant arrhythmias  Complexity Note: Imaging results reviewed. Results shared with Mr. Phimmasone, using Layman's terms.                         Meds   Current Outpatient Medications:  .  aspirin EC 81 MG tablet, Take 81 mg by mouth at bedtime., Disp: , Rfl:  .  Blood Glucose Monitoring Suppl (ONE TOUCH ULTRA 2) w/Device KIT, Use to obtain blood sugar daily. Dx Code E11.40, Disp: 1 each, Rfl: 0 .  carvedilol (COREG) 3.125 MG tablet, Take 1 tablet (3.125 mg total) by mouth 2 (two) times daily with a meal., Disp: 180 tablet, Rfl: 3 .  cholecalciferol (VITAMIN D) 1000 units tablet, Take 2,000 Units by mouth daily. , Disp: , Rfl:  .  clopidogrel (PLAVIX) 75 MG tablet, TAKE 1 TABLET BY MOUTH DAILY WITH BREAKFAST., Disp: 30 tablet, Rfl: 10 .  DHA-Vitamin C-Lutein (EYE HEALTH FORMULA PO), Take 1 tablet by  mouth daily. , Disp: , Rfl:  .  ferrous sulfate 324 (65 Fe) MG TBEC, Take 1 tablet by mouth daily., Disp: , Rfl:  .  furosemide (LASIX) 40 MG tablet, TAKE 1 TO 2 TABLETS BY MOUTH EVERY DAY, Disp: 60 tablet, Rfl: 11 .  glucose blood (ONE TOUCH ULTRA TEST) test strip, USE TO CHECK BLOOD SUGAR ONCE A DAY Dx Code E11.40, Disp: 100 each, Rfl: 3 .  [START ON 11/30/2017] HYDROcodone-acetaminophen (NORCO) 10-325 MG tablet, Take 1-2 tablets by mouth every 4 (four) hours as needed., Disp: 180 tablet, Rfl: 0 .  linaclotide (LINZESS) 145 MCG CAPS capsule, Take 145 mcg by mouth daily before breakfast., Disp: , Rfl:  .  nitroGLYCERIN (NITROSTAT) 0.4 MG SL tablet, DISSOLVE 1 TABLET UNDER THE TONGUE FOR CHEST PAIN. MAY REPEAT EVERY 5MINUTES UP TO 3 DOSES. IF NO RELIEF, CALL 911**, Disp: 25 tablet, Rfl: 5 .  ondansetron (ZOFRAN) 4 MG tablet, TAKE 1 TABLET BY MOUTH EVERY 8 HOURS AS NEEDED FOR NAUSEA OR VOMITING, Disp: 60 tablet, Rfl: 0 .  ONETOUCH DELICA  LANCETS 33G MISC, 1 each by In Vitro route daily. Dx Code E11.49, Disp: 100 each, Rfl: 3 .  OXYGEN, Inhale into the lungs. Per pt- Uses Oxygen 2 liters at night., Disp: , Rfl:  .  pantoprazole (PROTONIX) 40 MG tablet, TAKE 1 TABLET BY MOUTH TWICE (2) DAILY, Disp: 60 tablet, Rfl: 11 .  AMITIZA 24 MCG capsule, Take 2 capsules by mouth daily., Disp: , Rfl:  .  [START ON 12/30/2017] HYDROcodone-acetaminophen (NORCO) 10-325 MG tablet, Take 1-2 tablets by mouth every 4 (four) hours as needed for severe pain., Disp: 180 tablet, Rfl: 0  ROS  Constitutional: Denies any fever or chills Gastrointestinal: No reported hemesis, hematochezia, vomiting, or acute GI distress Musculoskeletal: Denies any acute onset joint swelling, redness, loss of ROM, or weakness Neurological: No reported episodes of acute onset apraxia, aphasia, dysarthria, agnosia, amnesia, paralysis, loss of coordination, or loss of consciousness  Allergies  Mr. Knierim is allergic to doxazosin mesylate and  methocarbamol.  [place]  Drug: Mr. Morlock  reports that he does not use drugs. Alcohol:  reports that he does not drink alcohol. Tobacco:  reports that he quit smoking about 54 years ago. His smoking use included cigarettes. He has a 20.00 pack-year smoking history. He has never used smokeless tobacco. Medical:  has a past medical history of Arthritis, CAD (coronary artery disease), Chronic diastolic CHF (congestive heart failure) ([place]), Chronic kidney disease, stage III (moderate) ([place]), Chronic lower back pain, Colon polyps, Diverticulosis, Dyspnea (2009 since July -Sept), Enlarged prostate, Esophageal stricture, GERD (gastroesophageal reflux disease), Heart murmur, Hiatal hernia, History of kidney stones, History of PFTs, Hyperlipidemia, Hypertension, Interstitial lung disease ([place]), Nausea & vomiting, Overweight (BMI 25.0-29.9), RA (rheumatoid arthritis) ([place]), Seropositive rheumatoid arthritis ([place]), and Type II diabetes mellitus ([place]). Surgical: Mr. Kleinfelter  has a past surgical history that includes Total knee arthroplasty (Right, 03/2010); Knee arthroscopy (Right, 2008); Cataract extraction w/ intraocular lens  implant, bilateral (Bilateral); Coronary artery bypass graft (11/1999); Coronary stent placement (02/2012); Shoulder arthroscopy with open rotator cuff repair and distal clavicle acrominectomy (Left, 02/27/2013); Trigger finger release (Left, 02/27/2013); Cholecystectomy open (11/2003); Joint replacement; Esophagogastroduodenoscopy (egd) with esophageal dilation (2010); Cardiac catheterization (08/2004); Cardiac catheterization (12/31/2011); Coronary angioplasty with stent (01/03/2012); Coronary angioplasty with stent (01/14/2014); Cardiac catheterization (2009); left and right heart catheterization with coronary angiogram (12/31/2011); percutaneous coronary stent intervention (pci-s) (12/31/2011); percutaneous coronary stent intervention (pci-s) (N/A, 01/03/2012); Percutaneous coronary  intervention-balloon only (01/03/2012); left and right heart catheterization with coronary angiogram (N/A, 01/14/2014); Hand surgery; Cardiac catheterization (N/A, 08/13/2015); Esophagogastroduodenoscopy (N/A, 03/01/2017); and maloney dilation (03/01/2017). Family: family history includes Alcohol abuse in his sister; COPD in his mother; Colon cancer (age of onset: 67) in his brother; Diabetes in his brother; Heart attack in his father; Heart disease in his father; Stroke in his sister.  Constitutional Exam  General appearance: Well nourished, well developed, and well hydrated. In no apparent acute distress Vitals:   11/21/17 1334  BP: (!) 159/83  Pulse: 61  Resp: 16  Temp: (!) 97.5 F (36.4 C)  TempSrc: Oral  SpO2: 96%  Weight: 200 lb (90.7 kg)  Height: '6\' 1"'$  (1.854 m)   BMI Assessment: Estimated body mass index is 26.39 kg/m as calculated from the following:   Height as of this encounter: '6\' 1"'$  (1.854 m).   Weight as of this encounter: 200 lb (90.7 kg).  BMI interpretation table: BMI level Category Range association with higher incidence of chronic pain  <18 kg/m2 Underweight   18.5-24.9 kg/m2 Ideal  body weight   25-29.9 kg/m2 Overweight Increased incidence by 20%  30-34.9 kg/m2 Obese (Class I) Increased incidence by 68%  35-39.9 kg/m2 Severe obesity (Class II) Increased incidence by 136%  >40 kg/m2 Extreme obesity (Class III) Increased incidence by 254%   Patient's current BMI Ideal Body weight  Body mass index is 26.39 kg/m. Ideal body weight: 79.9 kg (176 lb 2.4 oz) Adjusted ideal body weight: 84.2 kg (185 lb 11 oz)   BMI Readings from Last 4 Encounters:  11/21/17 26.39 kg/m  10/26/17 26.12 kg/m  10/17/17 26.12 kg/m  09/19/17 26.65 kg/m   Wt Readings from Last 4 Encounters:  11/21/17 200 lb (90.7 kg)  10/26/17 198 lb (89.8 kg)  10/17/17 198 lb (89.8 kg)  09/19/17 202 lb (91.6 kg)  Psych/Mental status: Alert, oriented x 3 (person, place, & time)       Eyes:  PERLA Respiratory: No evidence of acute respiratory distress  Cervical Spine Area Exam  Skin & Axial Inspection: No masses, redness, edema, swelling, or associated skin lesions Alignment: Symmetrical Functional ROM: Unrestricted ROM      Stability: No instability detected Muscle Tone/Strength: Functionally intact. No obvious neuro-muscular anomalies detected. Sensory (Neurological): Unimpaired Palpation: No palpable anomalies              Upper Extremity (UE) Exam    Side: Right upper extremity  Side: Left upper extremity  Skin & Extremity Inspection: Skin color, temperature, and hair growth are WNL. No peripheral edema or cyanosis. No masses, redness, swelling, asymmetry, or associated skin lesions. No contractures.  Skin & Extremity Inspection: Skin color, temperature, and hair growth are WNL. No peripheral edema or cyanosis. No masses, redness, swelling, asymmetry, or associated skin lesions. No contractures.  Functional ROM: Unrestricted ROM          Functional ROM: Unrestricted ROM          Muscle Tone/Strength: Functionally intact. No obvious neuro-muscular anomalies detected.  Muscle Tone/Strength: Functionally intact. No obvious neuro-muscular anomalies detected.  Sensory (Neurological): Unimpaired          Sensory (Neurological): Unimpaired          Palpation: No palpable anomalies              Palpation: No palpable anomalies              Provocative Test(s):  Phalen's test: deferred Tinel's test: deferred Apley's scratch test (touch opposite shoulder):  Action 1 (Across chest): deferred Action 2 (Overhead): deferred Action 3 (LB reach): deferred   Provocative Test(s):  Phalen's test: deferred Tinel's test: deferred Apley's scratch test (touch opposite shoulder):  Action 1 (Across chest): deferred Action 2 (Overhead): deferred Action 3 (LB reach): deferred    Thoracic Spine Area Exam  Skin & Axial Inspection: No masses, redness, or swelling Alignment:  Symmetrical Functional ROM: Unrestricted ROM Stability: No instability detected Muscle Tone/Strength: Functionally intact. No obvious neuro-muscular anomalies detected. Sensory (Neurological): Unimpaired Muscle strength & Tone: No palpable anomalies  Lumbar Spine Area Exam  Skin & Axial Inspection: No masses, redness, or swelling Alignment: Symmetrical Functional ROM: Unrestricted ROM       Stability: No instability detected Muscle Tone/Strength: Functionally intact. No obvious neuro-muscular anomalies detected. Sensory (Neurological): Unimpaired Palpation: No palpable anomalies       Provocative Tests: Hyperextension/rotation test: deferred today       Lumbar quadrant test (Kemp's test): deferred today       Lateral bending test: deferred today  Patrick's Maneuver: deferred today                   FABER test: deferred today                   S-I anterior distraction/compression test: deferred today         S-I lateral compression test: deferred today         S-I Thigh-thrust test: deferred today         S-I Gaenslen's test: deferred today          Gait & Posture Assessment  Ambulation: Unassisted Gait: Relatively normal for age and body habitus Posture: WNL   Lower Extremity Exam    Side: Right lower extremity  Side: Left lower extremity  Stability: No instability observed          Stability: No instability observed          Skin & Extremity Inspection: Skin color, temperature, and hair growth are WNL. No peripheral edema or cyanosis. No masses, redness, swelling, asymmetry, or associated skin lesions. No contractures.  Skin & Extremity Inspection: Skin color, temperature, and hair growth are WNL. No peripheral edema or cyanosis. No masses, redness, swelling, asymmetry, or associated skin lesions. No contractures.  Functional ROM: Unrestricted ROM                  Functional ROM: Unrestricted ROM                  Muscle Tone/Strength: Functionally intact. No obvious  neuro-muscular anomalies detected.  Muscle Tone/Strength: Functionally intact. No obvious neuro-muscular anomalies detected.  Sensory (Neurological): Neuropathic pain pattern  Sensory (Neurological): Neuropathic pain pattern  Palpation: No palpable anomalies  Palpation: No palpable anomalies   Assessment  Primary Diagnosis & Pertinent Problem List: The primary encounter diagnosis was Chronic pain of both lower extremities. Diagnoses of Neuropathic pain, Chronic pain syndrome, Long term prescription opiate use, Diabetic polyneuropathy associated with type 2 diabetes mellitus ([place]), Neuropathy, Seropositive rheumatoid arthritis ([place]), and CKD stage 3 due to type 2 diabetes mellitus ([place]) were also pertinent to this visit.  Status Diagnosis  Persistent Persistent Persistent 1. Chronic pain of both lower extremities   2. Neuropathic pain   3. Chronic pain syndrome   4. Long term prescription opiate use   5. Diabetic polyneuropathy associated with type 2 diabetes mellitus ([place])   6. Neuropathy   7. Seropositive rheumatoid arthritis ([place])   8. CKD stage 3 due to type 2 diabetes mellitus ([place])      General Recommendations: The pain condition that the patient suffers from is best treated with a multidisciplinary approach that involves an increase in physical activity to prevent de-conditioning and worsening of the pain cycle, as well as psychological counseling (formal and/or informal) to address the co-morbid psychological affects of pain. Treatment will often involve judicious use of pain medications and interventional procedures to decrease the pain, allowing the patient to participate in the physical activity that will ultimately produce long-lasting pain reductions. The goal of the multidisciplinary approach is to return the patient to a higher level of overall function and to restore their ability to perform activities of daily living.  84 year old male with a history of coronary artery  disease, GERD, interstitial lung disease, rheumatoid arthritis, aortic stenosis, chronic kidney disease stage III who presents with lower extremity neuropathic pain secondary to diabetic polyneuropathy. Patient's diagnosis of diabetes was over 10-15 years ago. He is not on insulin. He  describes burning and tingling sensation in his toes. This is worsened over time. This is progressed to the dorsum of his foot. Patient has been tried on various medications including desipramine, lidocaine cream, gabapentin at a dose of 900 mill grams in the morning, 600 mg in the afternoon, 600 mg at bedtime. This dose had to be reduced secondary to his chronic kidney disease. Patient was also tried on Lyrica 100 mgtwice daily but it was too expensive for the patient to afford. Furthermore Lyrica resulted in leg swelling and further dose escalation was limited by patient's chronic kidney disease stage III. It was not very effective for his neuropathic pain symptoms. Patient also has tried carbamazepine which was not effective for his neuropathic pain. Patient has also had injections in his foot for his pain symptoms which were not effective.   Today, we will refill the patient's Hydrocodone as below. No dose change. I also recommend the patient try alpha lipoic acid for neuropathic pain which can be picked up OTC, 300-600 mg daily.  Plan of Care  Pharmacotherapy (Medications Ordered): Meds ordered this encounter  Medications  . HYDROcodone-acetaminophen (NORCO) 10-325 MG tablet    Sig: Take 1-2 tablets by mouth every 4 (four) hours as needed.    Dispense:  180 tablet    Refill:  0    For chronic pain 1-2 tablets q4 hrs prn, not to exceed 6 tablets a day  . HYDROcodone-acetaminophen (NORCO) 10-325 MG tablet    Sig: Take 1-2 tablets by mouth every 4 (four) hours as needed for severe pain.    Dispense:  180 tablet    Refill:  0    Do not place this medication, or any other prescription from our practice, on  "Automatic Refill". Patient may have prescription filled one day early if pharmacy is closed on scheduled refill date.    Time Note:Greater than 50% of the 37mnute(s) of face-to-face time spent with MichealBerlinger, was spent in counseling/coordination of care regarding:MichealForrester's primary cause of pain, the treatment plan, medication side effects, the opioid analgesic risks and possible complications, the appropriate use ofhismedications, the medication agreement and the patient's responsibilities when it comes to controlled substances.  Provider-requested follow-up: Return in about 10 weeks (around 01/30/2018) for Medication Management.  Future Appointments  Date Time Provider DDubois [facility]2018 [facility]30 [id number]  01/30/2018 12:30 PM LGillis Santa MD [facility] None  04/28/2018 11:30 AM LVenia Carbon MD [facility]    Primary Care Physician: LVenia Carbon MD [facility] Note by: BGillis Santa M.D Date: 11/21/2017; Time: 3:52 PM  Patient Instructions  Try alpha lipoic acid 300 mg to 600 mg once daily- you can get this over the counter  Refill of Hydrocodone Rx to last until 01/29/2018

## 2017-11-21 NOTE — Progress Notes (Signed)
Nursing Pain Medication Assessment:  Safety precautions to be maintained throughout the outpatient stay will include: orient to surroundings, keep bed in low position, maintain call bell within reach at all times, provide assistance with transfer out of bed and ambulation.  Medication Inspection Compliance: Pill count conducted under aseptic conditions, in front of the patient. Neither the pills nor the bottle was removed from the patient's sight at any time. Once count was completed pills were immediately returned to the patient in their original bottle.  Medication: Hydrocodone/APAP Pill/Patch Count: 58 of 180 pills remain Pill/Patch Appearance: Markings consistent with prescribed medication Bottle Appearance: Standard pharmacy container. Clearly labeled. Filled Date: 7 / 29 / 2019 Last Medication intake:  Today

## 2017-11-21 NOTE — Patient Instructions (Addendum)
Try alpha lipoic acid 300 mg to 600 mg once daily- you can get this over the counter  Refill of Hydrocodone Rx to last until 01/29/2018

## 2017-12-02 NOTE — Telephone Encounter (Signed)
Dorene Sorrow called from [place] requesting the form to be faxed again for diabetic shoes.  He is also requesting the last office visit notes stating that the patient has been diagnosed with diabetes.   Fax # [phone] CB# [phone]

## 2017-12-03 ENCOUNTER — Encounter (HOSPITAL_COMMUNITY): Payer: Self-pay | Admitting: Emergency Medicine

## 2017-12-03 ENCOUNTER — Emergency Department (HOSPITAL_COMMUNITY)
Admission: EM | Admit: 2017-12-03 | Discharge: 2017-12-03 | Disposition: A | Discharge status: Home / Self Care | Payer: Medicare Other | Attending: Emergency Medicine | Admitting: Emergency Medicine

## 2017-12-03 ENCOUNTER — Emergency Department (HOSPITAL_COMMUNITY): Payer: Medicare Other

## 2017-12-03 DIAGNOSIS — Y9289 Other specified places as the place of occurrence of the external cause: Secondary | ICD-10-CM | POA: Diagnosis not present

## 2017-12-03 DIAGNOSIS — N184 Chronic kidney disease, stage 4 (severe): Secondary | ICD-10-CM | POA: Insufficient documentation

## 2017-12-03 DIAGNOSIS — S8002XA Contusion of left knee, initial encounter: Secondary | ICD-10-CM | POA: Diagnosis not present

## 2017-12-03 DIAGNOSIS — W108XXA Fall (on) (from) other stairs and steps, initial encounter: Secondary | ICD-10-CM | POA: Insufficient documentation

## 2017-12-03 DIAGNOSIS — Y9389 Activity, other specified: Secondary | ICD-10-CM | POA: Diagnosis not present

## 2017-12-03 DIAGNOSIS — W19XXXA Unspecified fall, initial encounter: Secondary | ICD-10-CM

## 2017-12-03 DIAGNOSIS — M25561 Pain in right knee: Secondary | ICD-10-CM | POA: Diagnosis not present

## 2017-12-03 DIAGNOSIS — Z79899 Other long term (current) drug therapy: Secondary | ICD-10-CM | POA: Insufficient documentation

## 2017-12-03 DIAGNOSIS — S8992XA Unspecified injury of left lower leg, initial encounter: Secondary | ICD-10-CM | POA: Diagnosis not present

## 2017-12-03 DIAGNOSIS — Z96651 Presence of right artificial knee joint: Secondary | ICD-10-CM | POA: Diagnosis not present

## 2017-12-03 DIAGNOSIS — I130 Hypertensive heart and chronic kidney disease with heart failure and stage 1 through stage 4 chronic kidney disease, or unspecified chronic kidney disease: Secondary | ICD-10-CM | POA: Insufficient documentation

## 2017-12-03 DIAGNOSIS — Z7902 Long term (current) use of antithrombotics/antiplatelets: Secondary | ICD-10-CM | POA: Insufficient documentation

## 2017-12-03 DIAGNOSIS — E1122 Type 2 diabetes mellitus with diabetic chronic kidney disease: Secondary | ICD-10-CM | POA: Insufficient documentation

## 2017-12-03 DIAGNOSIS — Z951 Presence of aortocoronary bypass graft: Secondary | ICD-10-CM | POA: Diagnosis not present

## 2017-12-03 DIAGNOSIS — M25462 Effusion, left knee: Secondary | ICD-10-CM | POA: Diagnosis not present

## 2017-12-03 DIAGNOSIS — Z9049 Acquired absence of other specified parts of digestive tract: Secondary | ICD-10-CM | POA: Insufficient documentation

## 2017-12-03 DIAGNOSIS — Z7982 Long term (current) use of aspirin: Secondary | ICD-10-CM | POA: Insufficient documentation

## 2017-12-03 DIAGNOSIS — T148XXA Other injury of unspecified body region, initial encounter: Secondary | ICD-10-CM

## 2017-12-03 DIAGNOSIS — Z87891 Personal history of nicotine dependence: Secondary | ICD-10-CM | POA: Diagnosis not present

## 2017-12-03 DIAGNOSIS — S5011XA Contusion of right forearm, initial encounter: Secondary | ICD-10-CM | POA: Insufficient documentation

## 2017-12-03 DIAGNOSIS — Y998 Other external cause status: Secondary | ICD-10-CM | POA: Diagnosis not present

## 2017-12-03 DIAGNOSIS — I5032 Chronic diastolic (congestive) heart failure: Secondary | ICD-10-CM | POA: Insufficient documentation

## 2017-12-03 DIAGNOSIS — M7989 Other specified soft tissue disorders: Secondary | ICD-10-CM | POA: Diagnosis not present

## 2017-12-03 DIAGNOSIS — I2510 Atherosclerotic heart disease of native coronary artery without angina pectoris: Secondary | ICD-10-CM | POA: Diagnosis not present

## 2017-12-03 NOTE — Discharge Instructions (Signed)
Make sure to use a cane or walker every time you try to walk to prevent falls while your knee is hurting.  You may also take regular pain medications at home.  Be sure to apply ice to your knee and arm for 20 minutes at a time with a barrier like a towel between the ice and your skin at least 5 times daily over the next few days. Return to the emergency department for any new or worsening symptoms.  Your tetanus vaccination is up-to-date and was last given to you on 11/07/2011 and is good for 10 years unless you have a highly contaminated wound after 5. Contact a health care provider if: You have pain that gets worse. The cast, brace, or splint does not fit right. The cast, brace, or splint gets damaged. Get help right away if: You cannot use your injured joint to support any of your body weight (cannot bear weight). You cannot move the injured joint. You cannot walk more than a few steps without pain or without your knee buckling. You have significant pain, swelling, or numbness below the cast, brace, or splint.

## 2017-12-03 NOTE — ED Triage Notes (Addendum)
Pt reports tripping and falling last night onto his knees, reports he did not hit his head, is on a blood thinner, c/o left knee pain. Took oxycodone with some relief.

## 2017-12-03 NOTE — ED Provider Notes (Signed)
[facility] EMERGENCY DEPARTMENT Provider Note   CSN: [id number] Arrival date & time: 12/03/17  1056     History   Chief Complaint Chief Complaint  Patient presents with  . Fall  . Knee Pain    HPI [name] is a 84 y.o. male who presents emergency department for evaluation of knee pain after mechanical fall.  [place] going [place] the stairs.  He landed on his left knee and hit his right arm on 1 of the benches.  He did not hit his head or lose consciousness.  He is on Plavix.  He has known advanced degenerative osteoarthritis of the left knee.  He put ice on it and took his normal oxycodone for pain however has had significant difficulty ambulating.  He did use a walker that he had at home which allowed him to get here to the hospital.  He also complains of a "knot" on his left forearm after the fall.  It is tender to palpation.  He denies any weakness, numbness, tingling outside of his baseline neuropathy.  He is up-to-date on his tetanus vaccination as of November 07, 2011.  HPI  Past Medical History:  Diagnosis Date  . Arthritis    osteoarthritis, s/p R TKR, and digits  . CAD (coronary artery disease)    a. s/p CABG (2001)  b. s/p DES to RCA and cutting POBA to ostial PDA (2013)   c. s/p DES to SVG to OM2 (01/14/14) d. cath: 08/2015 NSTEMI w/ patent LIMA-LAD and 99% stenosis of SVG-OM w/ DES placed. CTO of SVG-RCA and SVG-D1.   [name] Chronic diastolic CHF (congestive heart failure) ([place])    a) 09/13 ECHO- LVEF 79-15%, grade 1 diastolic dysfunction, mild LA dilatation, atrial septal aneurysm, AV mobility restricted, but no sig AS by doppler; b) 09/04/08 ECHO- LVH, ef 60%, mild AS, c. echo 08/2015: EF perserved of 55-60% with inferolateral HK. Mild AS noted.  . Chronic kidney disease, stage III (moderate) ([place])   . Chronic lower back pain   . Colon polyps   . Diverticulosis   . Dyspnea 2009 since July -Sept   05/06/08-[facility]ST-   normal effort, reduced VO2 max 20.5 /65%, reduced at 8.2/ 40%, normal breathing resetvca of 55%, submaximal heart rate response 112/77%, flattened o2 pluse response at peak exercise-12 ml/beat @ 85%, No VQ mismatch abnormalities, All c/w CIRC Limitation  . Enlarged prostate   . Esophageal stricture    a. s/p dilation spring 2010  . GERD (gastroesophageal reflux disease)   . Heart murmur   . Hiatal hernia   . History of kidney stones   . History of PFTs    mixed pattern on spiro. mild restn on lung volumes with near normal DLCO. Pattern can be explained by CABG scar. Fev1 2.2L/73%, ratio 68 (67), TLC 4.7/68%,RV 1.5L/55%,DLCO 79%  . Hyperlipidemia   . Hypertension   . Interstitial lung disease ([name])    NOS  . Nausea & vomiting    2018/2019  . Overweight (BMI 25.0-29.9)    BMI 29  . RA (rheumatoid arthritis) ([name])    [name]  . Seropositive rheumatoid arthritis ([place])   . Type II diabetes mellitus [facility])     Patient Active Problem List   Diagnosis Date Noted  . Chronic pain of both lower extremities 10/26/2017  . Neuropathic pain 10/26/2017  . Chronic pain syndrome 10/26/2017  . Cerumen impaction 08/16/2017  . Iron deficiency  anemia 07/05/2017  . Constipation 07/05/2017  . CKD (chronic kidney disease) stage 4, GFR 15-29 ml/min ([name]) 05/17/2017  . Seropositive rheumatoid arthritis ([place]) 05/17/2017  . DM (diabetes mellitus) type II controlled, neurological manifestation ([place]) 05/17/2017  . Atherosclerotic heart disease of native coronary artery with angina pectoris ([place]) 05/17/2017  . Dysphagia 05/17/2017  . High risk medication use 10/21/2016  . Primary osteoarthritis of both knees 10/21/2016  . History of right knee joint replacement 10/21/2016  . Orthostatic hypotension 10/01/2016  . Diarrhea 08/18/2016  . Tegretol-induced dizziness 05/14/2016  . Spinal stenosis of lumbar region without neurogenic claudication 03/23/2016  . Spondylosis without myelopathy or radiculopathy,  lumbar region 03/23/2016  . Respiratory failure, chronic ([place]) 11/21/2014  . Rheumatoid arthritis ([place]) 11/05/2014  . Rheumatic fever without heart involvement 03/04/2014  . Intermediate coronary syndrome ([place]) 01/15/2014  . Ventral hernia 12/17/2013  . Routine general medical examination at a health care facility 08/29/2012  . Routine history and physical examination of adult 08/29/2012  . ILD (interstitial lung disease) ([place]) 11/28/2011  . Chronic diastolic heart failure ([place]) 09/14/2011  . Nausea 02/16/2011  . Type 2 diabetes mellitus with diabetic polyneuropathy ([place]) 08/10/2010  . Obstructive sleep apnea 12/17/2008  . ESOPHAGEAL STRICTURE 10/09/2008  . Esophageal stricture 10/09/2008  . Reflux esophagitis 09/10/2008  . Diverticulosis of colon 09/10/2008  . Gastro-esophageal reflux disease with esophagitis 09/10/2008  . Coronary atherosclerosis 03/19/2008  . Aortic valve disorder 03/19/2008  . Thoracic aorta atherosclerosis ([place]) 03/19/2008  . Actinic keratosis 10/23/2007  . SLEEP DISORDER, CHRONIC 10/17/2006  . Disturbance in sleep behavior 10/17/2006  . 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 Essential hypertension 09/23/2006  . GERD 09/23/2006  . BENIGN PROSTATIC HYPERTROPHY 09/23/2006  . Enlarged prostate without lower urinary tract symptoms (luts) 09/23/2006  . HLD (hyperlipidemia) 09/23/2006    Past Surgical History:  Procedure Laterality Date  . CARDIAC CATHETERIZATION  08/2004   CP- no MI, Cath- small vessell disease   . CARDIAC CATHETERIZATION  12/31/2011   80% distal LM, 100% native LAD, LCx and RCA, 30% prox SVG-OM, SVG-D1 normal, 99% distal, 80% ostial SVG-RCA distal to graft, LIMA-LAD normal; LVEF mildly decreased with posterior basal AK   . CARDIAC CATHETERIZATION  2009   with patent grafts/notes 12/31/2011  . CARDIAC CATHETERIZATION N/A 08/13/2015   Procedure: Left Heart Cath and Cors/Grafts Angiography;  Surgeon: Sherren Mocha, MD;   Location: [facility] CV LAB;  Service: Cardiovascular;  Laterality: N/A;  . CATARACT EXTRACTION W/ INTRAOCULAR LENS  IMPLANT, BILATERAL Bilateral   . CHOLECYSTECTOMY OPEN  11/2003   [name]  . CORONARY ANGIOPLASTY WITH STENT PLACEMENT  01/03/2012   Successful DES to SVG-RCA and cutting balloon angioplasty ostial  PDA   . CORONARY ANGIOPLASTY WITH STENT PLACEMENT  01/14/2014   "1"  . CORONARY ARTERY BYPASS GRAFT  11/1999   CABG X5  . CORONARY STENT PLACEMENT  02/2012   1 stent and balloon  . ESOPHAGOGASTRODUODENOSCOPY N/A 03/01/2017   Procedure: ESOPHAGOGASTRODUODENOSCOPY (EGD);  Surgeon: Irene Shipper, MD;  Location[facility];  Service: Endoscopy;  Laterality: N/A;  . ESOPHAGOGASTRODUODENOSCOPY (EGD) WITH ESOPHAGEAL DILATION  2010  . HAND SURGERY    . JOINT REPLACEMENT    . KNEE ARTHROSCOPY Right 2008  . LEFT AND RIGHT HEART CATHETERIZATION WITH CORONARY ANGIOGRAM  12/31/2011   Procedure: LEFT AND RIGHT HEART CATHETERIZATION WITH CORONARY ANGIOGRAM;  Surgeon: Burnell Blanks, MD;  Location: [facility] CATH LAB;  Service: Cardiovascular;;  . LEFT AND RIGHT HEART CATHETERIZATION  WITH CORONARY ANGIOGRAM N/A 01/14/2014   Procedure: LEFT AND RIGHT HEART CATHETERIZATION WITH CORONARY ANGIOGRAM;  Surgeon: Peter M Martinique, MD;  Location: [facility] CATH LAB;  Service: Cardiovascular;  Laterality: N/A;  . MALONEY DILATION  03/01/2017   Procedure: Venia Minks DILATION;  Surgeon: Irene Shipper, MD;  Location: [facility];  Service: Endoscopy;;  . PERCUTANEOUS CORONARY INTERVENTION-BALLOON ONLY  01/03/2012   Procedure: PERCUTANEOUS CORONARY INTERVENTION-BALLOON ONLY;  Surgeon: Peter M Martinique, MD;  Location[facility] CATH LAB;  Service: Cardiovascular;;  . PERCUTANEOUS CORONARY STENT INTERVENTION (PCI-S)  12/31/2011   Procedure: PERCUTANEOUS CORONARY STENT INTERVENTION (PCI-S);  Surgeon: Burnell Blanks, MD;  Location: [facility] CATH LAB;  Service: Cardiovascular;;  . PERCUTANEOUS CORONARY STENT INTERVENTION (PCI-S) N/A  01/03/2012   Procedure: PERCUTANEOUS CORONARY STENT INTERVENTION (PCI-S);  Surgeon: Peter M Martinique, MD;  Location: [facility] CATH LAB;  Service: Cardiovascular;  Laterality: N/A;  . SHOULDER ARTHROSCOPY WITH OPEN ROTATOR CUFF REPAIR AND DISTAL CLAVICLE ACROMINECTOMY Left 02/27/2013   Procedure: LEFT SHOULDER ARTHROSCOPY WITH MINI OPEN ROTATOR CUFF REPAIR AND SUBACROMIAL DECOMPRESSION AND DISTAL CLAVICLE RESECTION;  Surgeon: [name], MD;  Location: [facility];  Service: Orthopedics;  Laterality: Left;  . TOTAL KNEE ARTHROPLASTY Right 03/2010   [name]  . TRIGGER FINGER RELEASE Left 02/27/2013   Procedure: RELEASE TRIGGER FINGER/A-1 PULLEY;  Surgeon: [name], MD;  Location: [facility];  Service: Orthopedics;  Laterality: Left;        Home Medications    Prior to Admission medications   Medication Sig Start Date End Date Taking? Authorizing Provider  AMITIZA 24 MCG capsule Take 2 capsules by mouth daily. 10/08/17   [provider]  aspirin EC 81 MG tablet Take 81 mg by mouth at bedtime.    [provider]  Blood Glucose Monitoring Suppl (ONE TOUCH ULTRA 2) w/Device KIT Use to obtain blood sugar daily. Dx Code E11.40 07/18/17   [name], MD  carvedilol (COREG) 3.125 MG tablet Take 1 tablet (3.125 mg total) by mouth 2 (two) times daily with a meal. 01/03/17   [name], MD  cholecalciferol (VITAMIN D) 1000 units tablet Take 2,000 Units by mouth daily.     [provider]  clopidogrel (PLAVIX) 75 MG tablet TAKE 1 TABLET BY MOUTH DAILY WITH BREAKFAST. 01/13/17   [name], MD  DHA-Vitamin C-Lutein (EYE HEALTH FORMULA PO) Take 1 tablet by mouth daily.     [provider]  ferrous sulfate 324 (65 Fe) MG TBEC Take 1 tablet by mouth daily.    [provider]  furosemide (LASIX) 40 MG tablet TAKE 1 TO 2 TABLETS BY MOUTH EVERY DAY 05/09/17   Viviana Simpler I, MD  glucose blood (ONE TOUCH ULTRA TEST) test strip USE TO CHECK BLOOD SUGAR  ONCE A DAY Dx Code E11.40 07/18/17   [name], MD  HYDROcodone-acetaminophen (NORCO) 10-325 MG tablet Take 1-2 tablets by mouth every 4 (four) hours as needed. 11/30/17 12/30/17  Gillis Santa, MD  HYDROcodone-acetaminophen (NORCO) 10-325 MG tablet Take 1-2 tablets by mouth every 4 (four) hours as needed for severe pain. 12/30/17 01/29/18  Gillis Santa, MD  linaclotide (LINZESS) 145 MCG CAPS capsule Take 145 mcg by mouth daily before breakfast.    [provider]  nitroGLYCERIN (NITROSTAT) 0.4 MG SL tablet DISSOLVE 1 TABLET UNDER THE TONGUE FOR CHEST PAIN. MAY REPEAT EVERY 5MINUTES UP TO 3 DOSES. IF NO RELIEF, CALL 911** 10/01/16   [name], MD  ondansetron (  ZOFRAN) 4 MG tablet TAKE 1 TABLET BY MOUTH EVERY 8 HOURS AS NEEDED FOR NAUSEA OR VOMITING 07/25/17   [name], MD  [facility] DELICA LANCETS 00T MISC 1 each by In Vitro route daily. Dx Code E11.49 07/18/17   [name], MD  OXYGEN Inhale into the lungs. Per pt- Uses Oxygen 2 liters at night.    [provider]  pantoprazole (PROTONIX) 40 MG tablet TAKE 1 TABLET BY MOUTH TWICE (2) DAILY 05/09/17   [name], MD    Family History Family History  Problem Relation Age of Onset  . COPD Mother   . Heart disease Father   . Heart attack Father   . Diabetes Brother   . Colon cancer Brother 25  . Alcohol abuse Sister   . Stroke Sister     Social History Social History   Tobacco Use  . Smoking status: Former Smoker    Packs/day: 1.00    Years: 20.00    Pack years: 20.00    Types: Cigarettes    Last attempt to quit: 04/06/1963    Years since quitting: 54.6  . Smokeless tobacco: Never Used  Substance Use Topics  . Alcohol use: No    Alcohol/week: 0.0 standard drinks    Comment: 01/01/2012 "last alcohol ~ 50 yr ago"  . Drug use: No     Allergies   Doxazosin mesylate and Methocarbamol   Review of Systems Review of Systems  Ten systems reviewed and are negative for acute change,  except as noted in the HPI.   Physical Exam Updated Vital Signs BP 138/69   Pulse 65   Temp 99.3 F (37.4 C) (Oral)   Resp 18   SpO2 96%   Physical Exam  Constitutional: He appears well-developed and well-nourished. No distress.  HENT:  Head: Normocephalic and atraumatic.  Eyes: Pupils are equal, round, and reactive to light. Conjunctivae and EOM are normal. No scleral icterus.  Neck: Normal range of motion. Neck supple.  Cardiovascular: Normal rate, regular rhythm and normal heart sounds.  Pulmonary/Chest: Effort normal and breath sounds normal. No respiratory distress.  Abdominal: Soft. There is no tenderness.  Musculoskeletal: He exhibits no edema.  Left knee exam reveals bruising and a small abrasion to the left knee.  No tenderness over the patella or joint lines bilaterally.  Patient is able to both actively and passively flex and extend the knee.  Ligaments are stable on evaluation.  Normal bilateral DP and PT pulses. Right upper extremity reveals hematoma of the middle belly of the extensor muscles of the forearm.  Is tender to palpation.  There is a small abrasion along the ulna.  Full range of motion of the wrist elbow and ipsilateral shoulder on the right.  Normal grip strength and bilateral radial pulses are equal and 2+ with normal sensation in the fingers.  Neurological: He is alert.  Skin: Skin is warm and dry. He is not diaphoretic.  Psychiatric: His behavior is normal.  Nursing note and vitals reviewed.    ED Treatments / Results  Labs (all labs ordered are listed, but only abnormal results are displayed) Labs Reviewed - No data to display  EKG None  Radiology Dg Knee Complete 4 Views Left  Result Date: 12/03/2017 CLINICAL DATA:  84 year old who fell and injured the LEFT knee yesterday. LATERAL pain and swelling. Initial encounter. EXAM: LEFT KNEE - COMPLETE 4+ VIEW COMPARISON:  09/15/2015. FINDINGS: No evidence of acute fracture or dislocation. Severe MEDIAL  compartment joint space narrowing with associated spurring. Mild patellofemoral compartment joint space narrowing with mild spurring along the undersurface of patella. LATERAL compartment joint space well-preserved. Bone mineral density well preserved for age. Small enthesopathic spur at the insertion of the quadriceps tendon on the SUPERIOR patella, unchanged. Very small joint effusion. IMPRESSION: 1. No acute osseous abnormality. 2. Severe MEDIAL compartment and mild patellofemoral compartment osteoarthritis. 3. Small joint effusion. Electronically Signed   By: Evangeline Dakin M.D.   On: 12/03/2017 12:30    Procedures Procedures (including critical care time)  Medications Ordered in ED Medications - No data to display   Initial Impression / Assessment and Plan / ED Course  I have reviewed the triage vital signs and the nursing notes.  Pertinent labs & imaging results that were available during my care of the patient were reviewed by me and considered in my medical decision making (see chart for details).  Clinical Course as of Dec 04 1423  Sat Dec 04, 5330  1380 84 year old male complaining of mechanical trip and fall striking his left knee yesterday while at the track.  He is complaining of a moderate amount of pain when he ambulates.  His x-ray does not show an obvious fracture and he is got some early bruising over the anterior knee with some tenderness.  He is likely going to get into a knee brace and be discharged to follow-up with his PCP.   [MB]    Clinical Course User Index [MB] Hayden Rasmussen, MD    Patient with mechanical fall, abrasion and bruising to the knee, hematoma of the right forearm.  History is gathered from the patient and I also gathered history from his wife and daughter. Personally reviewed the patient's 4 view knee x-ray which shows advanced osteoarthritic changes and a small effusion.  I agree with the radiologic interpretation. He has no apparent head trauma  and states that he did not hit his head or lose consciousness.  I do not feel that radiologic evaluation of his head and C-spine is warranted despite the fact that the patient is elderly and on a blood thinner. Patient is ample to ambulate with assistive device and has a reassuring examination of the left knee.  He is already on pain medications and I feel that his symptoms should improve over the next few days.  Discussed goals of care with the patient and his family including his wife and daughter who are at bedside.  The patient was given discharge instructions and follow-up with orthopedics.  And shared visit with Dr. Melina Copa who agrees with work-up and plan for discharge.  I gave return precautions with the patient and his family.  He appears appropriate for discharge at this time  Final Clinical Impressions(s) / ED Diagnoses   Final diagnoses:  Fall, initial encounter  Knee injury, left, initial encounter  Knee effusion, left  Hematoma    ED Discharge Orders    None       Margarita Mail, PA-C 12/03/17 1429    Hayden Rasmussen, MD 12/04/17 1010

## 2017-12-03 NOTE — ED Notes (Signed)
Signature pad unavailable at time of pt discharge. Pt verbalized understanding of d/c instructions.

## 2017-12-03 NOTE — ED Notes (Signed)
Went to get pt in gown and hooked up to monitor. Told pt I could help him but said he did not want me to and wanted to wait for his wife to help him get undressed.

## 2017-12-06 NOTE — Telephone Encounter (Signed)
I faxed him the information that we had already faxed to him, again.

## 2017-12-13 ENCOUNTER — Telehealth: Payer: Self-pay | Admitting: *Deleted

## 2017-12-13 ENCOUNTER — Telehealth: Payer: Self-pay | Admitting: Cardiovascular Disease

## 2017-12-13 NOTE — Telephone Encounter (Signed)
Walk In pt form-Clearance Dropped off. Placed in Triage box to be addressed.

## 2017-12-13 NOTE — Telephone Encounter (Signed)
   [facility] Pre-operative Risk Assessment    Request for surgical clearance:  1. What type of surgery is being performed?  2 SURGICAL EXTRACTIONS AND UPPER PARTIAL   2. When is this surgery scheduled?  TBD   3. What type of clearance is required (medical clearance vs. Pharmacy clearance to hold med vs. Both)?  PHARMACY  4. Are there any medications that need to be held prior to surgery and how long? PLAVIX   5. Practice name and name of physician performing surgery?  [facility]    6. What is your office phone number [phone]     7.   What is your office fax number [phone]  8.   Anesthesia type (None, local, MAC, general) ?     Jeanann Lewandowsky 12/13/2017, 3:34 PM  _________________________________________________________________   (provider comments below)

## 2017-12-14 NOTE — Telephone Encounter (Signed)
OK to hold Plavix for dental extraction?  Kerin Ransom PA-C 12/14/2017 3:36 PM

## 2017-12-15 DIAGNOSIS — E119 Type 2 diabetes mellitus without complications: Secondary | ICD-10-CM | POA: Diagnosis not present

## 2017-12-15 NOTE — Telephone Encounter (Signed)
Greater than 2 years since last stent. Stents are in "old grafts" and or treating ISR. Will have higher than usual risk off plavix. Overall probably okay to hold for up to 5 days then resume.

## 2017-12-16 DIAGNOSIS — R0689 Other abnormalities of breathing: Secondary | ICD-10-CM | POA: Diagnosis not present

## 2017-12-16 DIAGNOSIS — J8410 Pulmonary fibrosis, unspecified: Secondary | ICD-10-CM | POA: Diagnosis not present

## 2017-12-19 DIAGNOSIS — R1310 Dysphagia, unspecified: Secondary | ICD-10-CM | POA: Diagnosis not present

## 2017-12-19 DIAGNOSIS — K222 Esophageal obstruction: Secondary | ICD-10-CM | POA: Diagnosis not present

## 2017-12-19 DIAGNOSIS — K228 Other specified diseases of esophagus: Secondary | ICD-10-CM | POA: Diagnosis not present

## 2017-12-22 ENCOUNTER — Ambulatory Visit (INDEPENDENT_AMBULATORY_CARE_PROVIDER_SITE_OTHER): Payer: Medicare Other | Admitting: Orthopedic Surgery

## 2017-12-22 ENCOUNTER — Ambulatory Visit (INDEPENDENT_AMBULATORY_CARE_PROVIDER_SITE_OTHER): Payer: Self-pay

## 2017-12-22 ENCOUNTER — Encounter (INDEPENDENT_AMBULATORY_CARE_PROVIDER_SITE_OTHER): Payer: Self-pay | Admitting: Orthopedic Surgery

## 2017-12-22 VITALS — BP 171/82 | HR 70 | Ht 73.0 in | Wt 198.0 lb

## 2017-12-22 DIAGNOSIS — M25562 Pain in left knee: Secondary | ICD-10-CM

## 2017-12-22 DIAGNOSIS — S8001XA Contusion of right knee, initial encounter: Secondary | ICD-10-CM

## 2017-12-22 DIAGNOSIS — G8929 Other chronic pain: Secondary | ICD-10-CM | POA: Diagnosis not present

## 2017-12-22 DIAGNOSIS — M1712 Unilateral primary osteoarthritis, left knee: Secondary | ICD-10-CM | POA: Diagnosis not present

## 2017-12-22 DIAGNOSIS — W010XXA Fall on same level from slipping, tripping and stumbling without subsequent striking against object, initial encounter: Secondary | ICD-10-CM

## 2017-12-22 DIAGNOSIS — Z96651 Presence of right artificial knee joint: Secondary | ICD-10-CM | POA: Diagnosis not present

## 2017-12-22 DIAGNOSIS — S8002XA Contusion of left knee, initial encounter: Secondary | ICD-10-CM | POA: Diagnosis not present

## 2017-12-22 MED ORDER — BUPIVACAINE HCL 0.5 % IJ SOLN
2.0000 mL | INTRAMUSCULAR | Status: AC | PRN
  Administered 2017-12-22: 2 mL via INTRA_ARTICULAR

## 2017-12-22 MED ORDER — LIDOCAINE HCL 1 % IJ SOLN
2.0000 mL | INTRAMUSCULAR | Status: AC | PRN
  Administered 2017-12-22: 2 mL

## 2017-12-22 MED ORDER — METHYLPREDNISOLONE ACETATE 40 MG/ML IJ SUSP
80.0000 mg | INTRAMUSCULAR | Status: AC | PRN
  Administered 2017-12-22: 80 mg

## 2017-12-22 NOTE — Progress Notes (Signed)
Office Visit Note   Patient: [name]           Date of Birth: May 09, 1931           MRN: [id number] Visit Date: 12/22/2017              Requested by: [name], MD [place], [place] [address] PCP: [name], MD   Assessment & Plan: Visit Diagnoses:  1. S/P total knee replacement using cement, right   2. Fall due to stumbling, initial encounter   3. Chronic pain of left knee   4. Unilateral primary osteoarthritis, left knee   5. Contusion of left knee, initial encounter   6. Contusion of right knee, initial encounter     Plan:  #1: Corticosteroid injection to the left knee was accomplished atraumatically. #2: No further treatment for the right knee at this time. #3: He does not have good effects he will give [place] a call we will see him again.  Follow-Up Instructions: No follow-ups on file.   Orders:  Orders Placed This Encounter  Procedures  . XR KNEE 3 VIEW RIGHT  . XR KNEE 3 VIEW LEFT   No orders of the defined types were placed in this encounter.     Procedures: Large Joint Inj: L knee on 12/22/2017 2:46 PM Indications: pain and diagnostic evaluation Details: 25 G 1.5 in needle, medial approach  Arthrogram: No  Medications: 2 mL lidocaine 1 %; 2 mL bupivacaine 0.5 %; 80 mg methylPREDNISolone acetate 40 MG/ML Procedure, treatment alternatives, risks and benefits explained, specific risks discussed. Consent was given by the patient. Immediately prior to procedure a time out was called to verify the correct patient, procedure, equipment, support staff and site/side marked as required. Patient was prepped and draped in the usual sterile fashion.       Clinical Data: No additional findings.   Subjective: Chief Complaint  Patient presents with  . Follow-up    BIL LAT KNEE PAIN BUT L KNEE PAIN, PT HAD FALL 11/22/17     HPI  [name] is an 84 year old white male who is seen today for evaluation of both his knees.  He  apparently was at a race and he went through a gait to go shake the hand of the winter of the race.  On his way back through he stumbled on a area at the gate and fell landing on both of his knees.  He had some contusion and ecchymosis to both knees.  He states his right knee is sore but certainly is not as painful as his left knee.  Been noted to have arthritis in his left knee previously.  Right total knee arthroplasty had been performed also.  His left knee is tender mainly along the anterior and lateral aspect of the knee.  Denies any hip or groin pain.  Denies any neurovascular compromise.  States he did have some ecchymosis about both knees.  Review of Systems  Constitutional: Positive for fatigue.  HENT: Negative for ear pain.   Eyes: Negative for pain.  Respiratory: Positive for shortness of breath. Negative for cough.   Cardiovascular: Positive for leg swelling.  Gastrointestinal: Positive for constipation.  Genitourinary: Negative for difficulty urinating.  Musculoskeletal: Negative for back pain and neck pain.  Skin: Negative for rash.  Allergic/Immunologic: Negative for food allergies.  Neurological: Positive for weakness. Negative for numbness.  Hematological: Bruises/bleeds easily.  Psychiatric/Behavioral: Negative for sleep disturbance.  Objective: Vital Signs: BP (!) 171/82 (BP Location: Left Arm, Patient Position: Sitting, Cuff Size: Normal)   Pulse 70   Ht '6\' 1"'$  (1.854 m)   Wt 198 lb (89.8 kg)   BMI 26.12 kg/m   Physical Exam  Constitutional: He is oriented to person, place, and time. He appears well-developed and well-nourished.  HENT:  Mouth/Throat: Oropharynx is clear and moist.  Eyes: Pupils are equal, round, and reactive to light. EOM are normal.  Pulmonary/Chest: Effort normal.  Neurological: He is alert and oriented to person, place, and time.  Skin: Skin is warm and dry.  Psychiatric: He has a normal mood and affect. His behavior is normal.    Ortho  Exam  Exam today reveals range of motion of the right knee from near full extension to 95 to 105 degrees.  He has good ligament stability.  I do not feel any crepitance with range of motion.  May be a little effusion but no warmth or erythema.  The left knee reveals range of motion from lacking about 5 degrees of full extension and flexes to about 95 degrees.  Trace effusion also.  No warmth or erythema.  Appears to have some latent ecchymosis about the knee anteriorly.  Does have some pseudolaxity with valgus stressing with a good endpoint.  Specialty Comments:  No specialty comments available.  Imaging: Xr Knee 3 View Left  Result Date: 12/22/2017 X-rays left knee reveals marked medial joint space narrowing with sclerosis of the proximal tibial plateau more than the medial femoral condyle.  There is periarticular spurring medially off of the tibia as well as laterally to.  There is patellofemoral joint base narrowing more medially.  Periarticular spurring both medially and laterally at the patellofemoral joint.  Xr Knee 3 View Right  Result Date: 12/22/2017 Three-view x-ray of the right knee reveals total knee arthroplasty in good position alignment.  Good cement mantle is noted.  Posterior vascular clips is noted on the lateral.    PMFS History: Current Outpatient Medications  Medication Sig Dispense Refill  . AMITIZA 24 MCG capsule Take 2 capsules by mouth daily.    [name] aspirin EC 81 MG tablet Take 81 mg by mouth at bedtime.    . Blood Glucose Monitoring Suppl (ONE TOUCH ULTRA 2) w/Device KIT Use to obtain blood sugar daily. Dx Code E11.40 1 each 0  . carvedilol (COREG) 3.125 MG tablet Take 1 tablet (3.125 mg total) by mouth 2 (two) times daily with a meal. 180 tablet 3  . cholecalciferol (VITAMIN D) 1000 units tablet Take 2,000 Units by mouth daily.     . clopidogrel (PLAVIX) 75 MG tablet TAKE 1 TABLET BY MOUTH DAILY WITH BREAKFAST. 30 tablet 10  . DHA-Vitamin C-Lutein (EYE HEALTH  FORMULA PO) Take 1 tablet by mouth daily.     . ferrous sulfate 324 (65 Fe) MG TBEC Take 1 tablet by mouth daily.    . furosemide (LASIX) 40 MG tablet TAKE 1 TO 2 TABLETS BY MOUTH EVERY DAY 60 tablet 11  . glucose blood (ONE TOUCH ULTRA TEST) test strip USE TO CHECK BLOOD SUGAR ONCE A DAY Dx Code E11.40 100 each 3  . HYDROcodone-acetaminophen (NORCO) 10-325 MG tablet Take 1-2 tablets by mouth every 4 (four) hours as needed. 180 tablet 0  . [START ON 12/30/2017] HYDROcodone-acetaminophen (NORCO) 10-325 MG tablet Take 1-2 tablets by mouth every 4 (four) hours as needed for severe pain. 180 tablet 0  . linaclotide (LINZESS) 145 MCG CAPS capsule  Take 145 mcg by mouth daily before breakfast.    . nitroGLYCERIN (NITROSTAT) 0.4 MG SL tablet DISSOLVE 1 TABLET UNDER THE TONGUE FOR CHEST PAIN. MAY REPEAT EVERY 5MINUTES UP TO 3 DOSES. IF NO RELIEF, CALL 911** 25 tablet 5  . ondansetron (ZOFRAN) 4 MG tablet TAKE 1 TABLET BY MOUTH EVERY 8 HOURS AS NEEDED FOR NAUSEA OR VOMITING 60 tablet 0  . ONETOUCH DELICA LANCETS 71G MISC 1 each by In Vitro route daily. Dx Code E11.49 100 each 3  . OXYGEN Inhale into the lungs. Per pt- Uses Oxygen 2 liters at night.    . pantoprazole (PROTONIX) 40 MG tablet TAKE 1 TABLET BY MOUTH TWICE (2) DAILY 60 tablet 11   No current facility-administered medications for this visit.     Patient Active Problem List   Diagnosis Date Noted  . Chronic pain of both lower extremities 10/26/2017  . Neuropathic pain 10/26/2017  . Chronic pain syndrome 10/26/2017  . Cerumen impaction 08/16/2017  . Iron deficiency anemia 07/05/2017  . Constipation 07/05/2017  . CKD (chronic kidney disease) stage 4, GFR 15-29 ml/min ([name]) 05/17/2017  . Seropositive rheumatoid arthritis ([place]) 05/17/2017  . DM (diabetes mellitus) type II controlled, neurological manifestation ([place]) 05/17/2017  . Atherosclerotic heart disease of native coronary artery with angina pectoris ([place]) 05/17/2017  . Dysphagia  05/17/2017  . High risk medication use 10/21/2016  . Primary osteoarthritis of both knees 10/21/2016  . History of right knee joint replacement 10/21/2016  . Orthostatic hypotension 10/01/2016  . Diarrhea 08/18/2016  . Tegretol-induced dizziness 05/14/2016  . Spinal stenosis of lumbar region without neurogenic claudication 03/23/2016  . Spondylosis without myelopathy or radiculopathy, lumbar region 03/23/2016  . Respiratory failure, chronic ([place]) 11/21/2014  . Rheumatoid arthritis ([place]) 11/05/2014  . Rheumatic fever without heart involvement 03/04/2014  . Intermediate coronary syndrome ([place]) 01/15/2014  . Ventral hernia 12/17/2013  . Routine general medical examination at a health care facility 08/29/2012  . Routine history and physical examination of adult 08/29/2012  . ILD (interstitial lung disease) ([place]) 11/28/2011  . Chronic diastolic heart failure ([place]) 09/14/2011  . Nausea 02/16/2011  . Type 2 diabetes mellitus with diabetic polyneuropathy ([place]) 08/10/2010  . Obstructive sleep apnea 12/17/2008  . ESOPHAGEAL STRICTURE 10/09/2008  . Esophageal stricture 10/09/2008  . Reflux esophagitis 09/10/2008  . Diverticulosis of colon 09/10/2008  . Gastro-esophageal reflux disease with esophagitis 09/10/2008  . Coronary atherosclerosis 03/19/2008  . Aortic valve disorder 03/19/2008  . Thoracic aorta atherosclerosis ([place]) 03/19/2008  . Actinic keratosis 10/23/2007  . SLEEP DISORDER, CHRONIC 10/17/2006  . Disturbance in sleep behavior 10/17/2006  . 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 Essential hypertension 09/23/2006  . GERD 09/23/2006  . BENIGN PROSTATIC HYPERTROPHY 09/23/2006  . Enlarged prostate without lower urinary tract symptoms (luts) 09/23/2006  . HLD (hyperlipidemia) 09/23/2006   Past Medical History:  Diagnosis Date  . Arthritis    osteoarthritis, s/p R TKR, and digits  . CAD (coronary artery disease)    a. s/p CABG (2001)  b. s/p DES to RCA  and cutting POBA to ostial PDA (2013)   c. s/p DES to SVG to OM2 (01/14/14) d. cath: 08/2015 NSTEMI w/ patent LIMA-LAD and 99% stenosis of SVG-OM w/ DES placed. CTO of SVG-RCA and SVG-D1.   [name] Chronic diastolic CHF (congestive heart failure) ([place])    a) 09/13 ECHO- LVEF 62-69%, grade 1 diastolic dysfunction, mild LA dilatation, atrial septal aneurysm, AV mobility restricted, but no sig AS by doppler; b) 09/04/08  ECHO- LVH, ef 60%, mild AS, c. echo 08/2015: EF perserved of 55-60% with inferolateral HK. Mild AS noted.  . Chronic kidney disease, stage III (moderate) ([place])   . Chronic lower back pain   . Colon polyps   . Diverticulosis   . Dyspnea 2009 since July -Sept   05/06/08-[facility]-  normal effort, reduced VO2 max 20.5 /65%, reduced at 8.2/ 40%, normal breathing resetvca of 55%, submaximal heart rate response 112/77%, flattened o2 pluse response at peak exercise-12 ml/beat @ 85%, No VQ mismatch abnormalities, All c/w CIRC Limitation  . Enlarged prostate   . Esophageal stricture    a. s/p dilation spring 2010  . GERD (gastroesophageal reflux disease)   . Heart murmur   . Hiatal hernia   . History of kidney stones   . History of PFTs    mixed pattern on spiro. mild restn on lung volumes with near normal DLCO. Pattern can be explained by CABG scar. Fev1 2.2L/73%, ratio 68 (67), TLC 4.7/68%,RV 1.5L/55%,DLCO 79%  . Hyperlipidemia   . Hypertension   . Interstitial lung disease ([name])    NOS  . Nausea & vomiting    2018/2019  . Overweight (BMI 25.0-29.9)    BMI 29  . RA (rheumatoid arthritis) ([name])    [name]  . Seropositive rheumatoid arthritis ([place])   . Type II diabetes mellitus ([name])     Family History  Problem Relation Age of Onset  . COPD Mother   . Heart disease Father   . Heart attack Father   . Diabetes Brother   . Colon cancer Brother 25  . Alcohol abuse Sister   . Stroke Sister     Past Surgical History:  Procedure Laterality Date  . CARDIAC CATHETERIZATION  08/2004   CP-  no MI, Cath- small vessell disease   . CARDIAC CATHETERIZATION  12/31/2011   80% distal LM, 100% native LAD, LCx and RCA, 30% prox SVG-OM, SVG-D1 normal, 99% distal, 80% ostial SVG-RCA distal to graft, LIMA-LAD normal; LVEF mildly decreased with posterior basal AK   . CARDIAC CATHETERIZATION  2009   with patent grafts/notes 12/31/2011  . CARDIAC CATHETERIZATION N/A 08/13/2015   Procedure: Left Heart Cath and Cors/Grafts Angiography;  Surgeon: Sherren Mocha, MD;  Location: [facility];  Service: Cardiovascular;  Laterality: N/A;  . CATARACT EXTRACTION W/ INTRAOCULAR LENS  IMPLANT, BILATERAL Bilateral   . CHOLECYSTECTOMY OPEN  11/2003   [name]  . CORONARY ANGIOPLASTY WITH STENT PLACEMENT  01/03/2012   Successful DES to SVG-RCA and cutting balloon angioplasty ostial  PDA   . CORONARY ANGIOPLASTY WITH STENT PLACEMENT  01/14/2014   "1"  . CORONARY ARTERY BYPASS GRAFT  11/1999   CABG X5  . CORONARY STENT PLACEMENT  02/2012   1 stent and balloon  . ESOPHAGOGASTRODUODENOSCOPY N/A 03/01/2017   Procedure: ESOPHAGOGASTRODUODENOSCOPY (EGD);  Surgeon: Irene Shipper, MD;  Location: [facility];  Service: Endoscopy;  Laterality: N/A;  . ESOPHAGOGASTRODUODENOSCOPY (EGD) WITH ESOPHAGEAL DILATION  2010  . HAND SURGERY    . JOINT REPLACEMENT    . KNEE ARTHROSCOPY Right 2008  . LEFT AND RIGHT HEART CATHETERIZATION WITH CORONARY ANGIOGRAM  12/31/2011   Procedure: LEFT AND RIGHT HEART CATHETERIZATION WITH CORONARY ANGIOGRAM;  Surgeon: Burnell Blanks, MD;  Location[facility] CATH LAB;  Service: Cardiovascular;;  . LEFT AND RIGHT HEART CATHETERIZATION WITH CORONARY ANGIOGRAM N/A 01/14/2014   Procedure: LEFT AND RIGHT HEART CATHETERIZATION WITH CORONARY ANGIOGRAM;  Surgeon: Peter M Martinique, MD;  Location: [facility]  CATH LAB;  Service: Cardiovascular;  Laterality: N/A;  . MALONEY DILATION  03/01/2017   Procedure: Venia Minks DILATION;  Surgeon: Irene Shipper, MD;  Location: [facility];  Service: Endoscopy;;  .  PERCUTANEOUS CORONARY INTERVENTION-BALLOON ONLY  01/03/2012   Procedure: PERCUTANEOUS CORONARY INTERVENTION-BALLOON ONLY;  Surgeon: Peter M Martinique, MD;  Location: [facility] CATH LAB;  Service: Cardiovascular;;  . PERCUTANEOUS CORONARY STENT INTERVENTION (PCI-S)  12/31/2011   Procedure: PERCUTANEOUS CORONARY STENT INTERVENTION (PCI-S);  Surgeon: Burnell Blanks, MD;  Location: [facility] CATH LAB;  Service: Cardiovascular;;  . PERCUTANEOUS CORONARY STENT INTERVENTION (PCI-S) N/A 01/03/2012   Procedure: PERCUTANEOUS CORONARY STENT INTERVENTION (PCI-S);  Surgeon: Peter M Martinique, MD;  Location: [facility] CATH LAB;  Service: Cardiovascular;  Laterality: N/A;  . SHOULDER ARTHROSCOPY WITH OPEN ROTATOR CUFF REPAIR AND DISTAL CLAVICLE ACROMINECTOMY Left 02/27/2013   Procedure: LEFT SHOULDER ARTHROSCOPY WITH MINI OPEN ROTATOR CUFF REPAIR AND SUBACROMIAL DECOMPRESSION AND DISTAL CLAVICLE RESECTION;  Surgeon: [name], MD;  Location: [facility];  Service: Orthopedics;  Laterality: Left;  . TOTAL KNEE ARTHROPLASTY Right 03/2010   [name]  . TRIGGER FINGER RELEASE Left 02/27/2013   Procedure: RELEASE TRIGGER FINGER/A-1 PULLEY;  Surgeon: [name], MD;  Location: [facility];  Service: Orthopedics;  Laterality: Left;   Social History   Occupational History  . Occupation: Designer, jewellery: RETIRED    Comment: retired  Tobacco Use  . Smoking status: Former Smoker    Packs/day: 1.00    Years: 20.00    Pack years: 20.00    Types: Cigarettes    Last attempt to quit: 04/06/1963    Years since quitting: 54.7  . Smokeless tobacco: Never Used  Substance and Sexual Activity  . Alcohol use: No    Alcohol/week: 0.0 standard drinks    Comment: 01/01/2012 "last alcohol ~ 50 yr ago"  . Drug use: No  . Sexual activity: Not Currently

## 2017-12-23 ENCOUNTER — Ambulatory Visit: Payer: Self-pay

## 2017-12-23 NOTE — Telephone Encounter (Signed)
Pt. States his ortho doctor gave him a steroid injection and told him it may elevate his blood sugar. Fasting blood sugar is usually 170 -185. It was 204 this morning. States he usually checks it once a week. Instructed pt. To check his glucose every morning over the weekend and monitor how how feels. Instructed to call back if glucose continues to be elevated for him. Verbalizes understanding.  Answer Assessment - Initial Assessment Questions 1. BLOOD GLUCOSE: "What is your blood glucose level?"      204 this morning 2. ONSET: "When did you check the blood glucose?"     Before breakfast 3. USUAL RANGE: "What is your glucose level usually?" (e.g., usual fasting morning value, usual evening value)     170-185 4. KETONES: "Do you check for ketones (urine or blood test strips)?" If yes, ask: "What does the test show now?"      No 5. TYPE 1 or 2:  "Do you know what type of diabetes you have?"  (e.g., Type 1, Type 2, Gestational; doesn't know)      Type 2  6. INSULIN: "Do you take insulin?" "What type of insulin(s) do you use? What is the mode of delivery? (syringe, pen (e.g., injection or  pump)?"      No 7. DIABETES PILLS: "Do you take any pills for your diabetes?" If yes, ask: "Have you missed taking any pills recently?"     Yes 8. OTHER SYMPTOMS: "Do you have any symptoms?" (e.g., fever, frequent urination, difficulty breathing, dizziness, weakness, vomiting)     No 9. PREGNANCY: "Is there any chance you are pregnant?" "When was your last menstrual period?"     n/a  Protocols used: DIABETES - HIGH BLOOD SUGAR-A-AH

## 2017-12-23 NOTE — Telephone Encounter (Signed)
Agree with advice given

## 2017-12-23 NOTE — Telephone Encounter (Signed)
Left message on VM per DPR advising pt [name] agreed with Triage Nurse.

## 2017-12-29 DIAGNOSIS — D485 Neoplasm of uncertain behavior of skin: Secondary | ICD-10-CM | POA: Diagnosis not present

## 2017-12-29 DIAGNOSIS — L820 Inflamed seborrheic keratosis: Secondary | ICD-10-CM | POA: Diagnosis not present

## 2017-12-29 DIAGNOSIS — L570 Actinic keratosis: Secondary | ICD-10-CM | POA: Diagnosis not present

## 2017-12-29 DIAGNOSIS — C4442 Squamous cell carcinoma of skin of scalp and neck: Secondary | ICD-10-CM | POA: Diagnosis not present

## 2018-01-02 ENCOUNTER — Telehealth: Payer: Self-pay | Admitting: Student in an Organized Health Care Education/Training Program

## 2018-01-02 DIAGNOSIS — H35372 Puckering of macula, left eye: Secondary | ICD-10-CM | POA: Diagnosis not present

## 2018-01-02 DIAGNOSIS — D3131 Benign neoplasm of right choroid: Secondary | ICD-10-CM | POA: Diagnosis not present

## 2018-01-02 DIAGNOSIS — H353213 Exudative age-related macular degeneration, right eye, with inactive scar: Secondary | ICD-10-CM | POA: Diagnosis not present

## 2018-01-02 DIAGNOSIS — H353221 Exudative age-related macular degeneration, left eye, with active choroidal neovascularization: Secondary | ICD-10-CM | POA: Diagnosis not present

## 2018-01-02 NOTE — Telephone Encounter (Signed)
Patient notified that he has a script that could be filled on 12-30-17 and that he did not need a new appointment.  I left him a messge and instructed him to call us back.

## 2018-01-02 NOTE — Telephone Encounter (Signed)
Left message

## 2018-01-02 NOTE — Telephone Encounter (Signed)
Patient states he will be out of meds 01-11-18 and refill appt is for 01-30-18. Please verify and call patient. He wants to come in earlier appt.

## 2018-01-03 NOTE — Telephone Encounter (Signed)
[name], patient called this  Morning and I tried to reschedule him but he wanted to keep appt for the 3rd.

## 2018-01-03 NOTE — Telephone Encounter (Signed)
Spoke with patient and he states that he has picked up his medication and he and someone he talked to here had  decided to keep the earlier appointment.  Reminded patient to bring medications  for count.

## 2018-01-04 ENCOUNTER — Telehealth: Payer: Self-pay

## 2018-01-04 NOTE — Telephone Encounter (Signed)
Copied from [place] [id number]. Topic: General - Other >> Dec 30, 2017  1:00 PM Mcneil, Ja-Kwan wrote: Reason for CRM: Pt returned call to the office. Pt states he has been checking his blood sugar and it has come down. Cb# [phone]

## 2018-01-04 NOTE — Telephone Encounter (Signed)
Okay Just have him let us know if it spikes up again (was probably just temporary from the steroids)

## 2018-01-04 NOTE — Telephone Encounter (Signed)
Spoke to pt

## 2018-01-04 NOTE — Telephone Encounter (Signed)
See phone note from 12/23/2017

## 2018-01-05 ENCOUNTER — Ambulatory Visit
Payer: Medicare Other | Attending: Student in an Organized Health Care Education/Training Program | Admitting: Student in an Organized Health Care Education/Training Program

## 2018-01-05 ENCOUNTER — Encounter: Payer: Self-pay | Admitting: Student in an Organized Health Care Education/Training Program

## 2018-01-05 ENCOUNTER — Other Ambulatory Visit: Payer: Self-pay

## 2018-01-05 VITALS — BP 163/78 | HR 58 | Temp 98.0°F | Resp 16 | Ht 73.0 in | Wt 205.0 lb

## 2018-01-05 DIAGNOSIS — M792 Neuralgia and neuritis, unspecified: Secondary | ICD-10-CM

## 2018-01-05 DIAGNOSIS — Z87891 Personal history of nicotine dependence: Secondary | ICD-10-CM | POA: Insufficient documentation

## 2018-01-05 DIAGNOSIS — M48061 Spinal stenosis, lumbar region without neurogenic claudication: Secondary | ICD-10-CM | POA: Diagnosis not present

## 2018-01-05 DIAGNOSIS — I2510 Atherosclerotic heart disease of native coronary artery without angina pectoris: Secondary | ICD-10-CM | POA: Diagnosis not present

## 2018-01-05 DIAGNOSIS — G629 Polyneuropathy, unspecified: Secondary | ICD-10-CM

## 2018-01-05 DIAGNOSIS — D509 Iron deficiency anemia, unspecified: Secondary | ICD-10-CM | POA: Diagnosis not present

## 2018-01-05 DIAGNOSIS — I130 Hypertensive heart and chronic kidney disease with heart failure and stage 1 through stage 4 chronic kidney disease, or unspecified chronic kidney disease: Secondary | ICD-10-CM | POA: Insufficient documentation

## 2018-01-05 DIAGNOSIS — N400 Enlarged prostate without lower urinary tract symptoms: Secondary | ICD-10-CM | POA: Insufficient documentation

## 2018-01-05 DIAGNOSIS — Z7982 Long term (current) use of aspirin: Secondary | ICD-10-CM | POA: Insufficient documentation

## 2018-01-05 DIAGNOSIS — G894 Chronic pain syndrome: Secondary | ICD-10-CM | POA: Diagnosis not present

## 2018-01-05 DIAGNOSIS — E1122 Type 2 diabetes mellitus with diabetic chronic kidney disease: Secondary | ICD-10-CM | POA: Diagnosis not present

## 2018-01-05 DIAGNOSIS — M79672 Pain in left foot: Secondary | ICD-10-CM | POA: Diagnosis present

## 2018-01-05 DIAGNOSIS — Z955 Presence of coronary angioplasty implant and graft: Secondary | ICD-10-CM | POA: Insufficient documentation

## 2018-01-05 DIAGNOSIS — K222 Esophageal obstruction: Secondary | ICD-10-CM | POA: Diagnosis not present

## 2018-01-05 DIAGNOSIS — N184 Chronic kidney disease, stage 4 (severe): Secondary | ICD-10-CM | POA: Insufficient documentation

## 2018-01-05 DIAGNOSIS — M79604 Pain in right leg: Secondary | ICD-10-CM | POA: Insufficient documentation

## 2018-01-05 DIAGNOSIS — M170 Bilateral primary osteoarthritis of knee: Secondary | ICD-10-CM | POA: Insufficient documentation

## 2018-01-05 DIAGNOSIS — Z79891 Long term (current) use of opiate analgesic: Secondary | ICD-10-CM | POA: Diagnosis not present

## 2018-01-05 DIAGNOSIS — K5730 Diverticulosis of large intestine without perforation or abscess without bleeding: Secondary | ICD-10-CM | POA: Diagnosis not present

## 2018-01-05 DIAGNOSIS — E1142 Type 2 diabetes mellitus with diabetic polyneuropathy: Secondary | ICD-10-CM | POA: Insufficient documentation

## 2018-01-05 DIAGNOSIS — Z6829 Body mass index (BMI) 29.0-29.9, adult: Secondary | ICD-10-CM | POA: Insufficient documentation

## 2018-01-05 DIAGNOSIS — M059 Rheumatoid arthritis with rheumatoid factor, unspecified: Secondary | ICD-10-CM | POA: Insufficient documentation

## 2018-01-05 DIAGNOSIS — Z86010 Personal history of colonic polyps: Secondary | ICD-10-CM | POA: Insufficient documentation

## 2018-01-05 DIAGNOSIS — Z87442 Personal history of urinary calculi: Secondary | ICD-10-CM | POA: Insufficient documentation

## 2018-01-05 DIAGNOSIS — E7841 Elevated Lipoprotein(a): Secondary | ICD-10-CM | POA: Diagnosis not present

## 2018-01-05 DIAGNOSIS — K219 Gastro-esophageal reflux disease without esophagitis: Secondary | ICD-10-CM | POA: Insufficient documentation

## 2018-01-05 DIAGNOSIS — Z96651 Presence of right artificial knee joint: Secondary | ICD-10-CM | POA: Diagnosis not present

## 2018-01-05 DIAGNOSIS — Z79899 Other long term (current) drug therapy: Secondary | ICD-10-CM | POA: Insufficient documentation

## 2018-01-05 DIAGNOSIS — N183 Chronic kidney disease, stage 3 (moderate): Secondary | ICD-10-CM

## 2018-01-05 DIAGNOSIS — Z8249 Family history of ischemic heart disease and other diseases of the circulatory system: Secondary | ICD-10-CM | POA: Insufficient documentation

## 2018-01-05 DIAGNOSIS — L570 Actinic keratosis: Secondary | ICD-10-CM | POA: Insufficient documentation

## 2018-01-05 DIAGNOSIS — M069 Rheumatoid arthritis, unspecified: Secondary | ICD-10-CM | POA: Insufficient documentation

## 2018-01-05 DIAGNOSIS — M79605 Pain in left leg: Secondary | ICD-10-CM | POA: Insufficient documentation

## 2018-01-05 DIAGNOSIS — E663 Overweight: Secondary | ICD-10-CM | POA: Insufficient documentation

## 2018-01-05 DIAGNOSIS — G4733 Obstructive sleep apnea (adult) (pediatric): Secondary | ICD-10-CM | POA: Diagnosis not present

## 2018-01-05 DIAGNOSIS — I5032 Chronic diastolic (congestive) heart failure: Secondary | ICD-10-CM | POA: Insufficient documentation

## 2018-01-05 DIAGNOSIS — J849 Interstitial pulmonary disease, unspecified: Secondary | ICD-10-CM | POA: Diagnosis not present

## 2018-01-05 DIAGNOSIS — K449 Diaphragmatic hernia without obstruction or gangrene: Secondary | ICD-10-CM | POA: Insufficient documentation

## 2018-01-05 DIAGNOSIS — G8929 Other chronic pain: Secondary | ICD-10-CM

## 2018-01-05 MED ORDER — HYDROCODONE-ACETAMINOPHEN 10-325 MG PO TABS: 1.0000 | ORAL_TABLET | ORAL | 0 refills | Status: DC | PRN

## 2018-01-05 MED ORDER — HYDROCODONE-ACETAMINOPHEN 10-325 MG PO TABS: 1.0000 | ORAL_TABLET | ORAL | 0 refills | Status: AC | PRN

## 2018-01-05 NOTE — Progress Notes (Signed)
Patient's Name: [name]  MRN: [id number]  Referring Provider: [name], MD  DOB: 23-Jan-1932  PCP: [name], MD  DOS: 01/05/2018  Note by: Gillis Santa, MD  Service setting: Ambulatory outpatient  Specialty: Interventional Pain Management  Location: [facility] (AMB) Pain Management Facility    Patient type: Established   Primary Reason(s) for Visit: Encounter for prescription drug management. (Level of risk: moderate)  CC: Foot Pain (bilateral)  HPI  [name] is a 84 y.o. year old, male patient, who comes today for a medication management evaluation. He has Familial multiple lipoprotein-type hyperlipidemia; Obstructive sleep apnea; Essential hypertension; Coronary atherosclerosis; Reflux esophagitis; ESOPHAGEAL STRICTURE; GERD; Diverticulosis of colon; BENIGN PROSTATIC HYPERTROPHY; Actinic keratosis; SLEEP DISORDER, CHRONIC; Nausea; Chronic diastolic heart failure ([name]); ILD (interstitial lung disease) ([place]); CKD (chronic kidney disease) stage 4, GFR 15-29 ml/min ([place]); Routine general medical examination at a health care facility; Seropositive rheumatoid arthritis ([place]); Ventral hernia; DM (diabetes mellitus) type II controlled, neurological manifestation ([place]); Atherosclerotic heart disease of native coronary artery with angina pectoris ([place]); Respiratory failure, chronic ([place]); Spinal stenosis of lumbar region without neurogenic claudication; Spondylosis without myelopathy or radiculopathy, lumbar region; Tegretol-induced dizziness; Diarrhea; Orthostatic hypotension; High risk medication use; Primary osteoarthritis of both knees; History of right knee joint replacement; Dysphagia; Esophageal stricture; Cerumen impaction; Aortic valve disorder; Enlarged prostate without lower urinary tract symptoms (luts); Iron deficiency anemia; Rheumatic fever without heart involvement; Type 2 diabetes mellitus with diabetic polyneuropathy ([place]); Intermediate coronary syndrome ([place]); Thoracic aorta  atherosclerosis ([place]); Constipation; HLD (hyperlipidemia); Rheumatoid arthritis ([place]); Gastro-esophageal reflux disease with esophagitis; Routine history and physical examination of adult; Disturbance in sleep behavior; Chronic pain of both lower extremities; Neuropathic pain; and Chronic pain syndrome on their problem list. His primarily concern today is the Foot Pain (bilateral)  Pain Assessment: Location: Right, Left Foot Radiating: denies Onset: More than a month ago Duration: Chronic pain Quality: Burning, Throbbing Severity: 2 /10 (subjective, self-reported pain score)  Note: Reported level is compatible with observation.                         When using our objective Pain Scale, levels between 6 and 10/10 are said to belong in an emergency room, as it progressively worsens from a 6/10, described as severely limiting, requiring emergency care not usually available at an outpatient pain management facility. At a 6/10 level, communication becomes difficult and requires great effort. Assistance to reach the emergency department may be required. Facial flushing and profuse sweating along with potentially dangerous increases in heart rate and blood pressure will be evident. Effect on ADL:   Timing: Constant Modifying factors: medications BP: (!) 163/78  HR: (!) 58  [name] was last scheduled for an appointment on 01/02/2018 for medication management. During today's appointment we reviewed [name] chronic pain status, as well as his outpatient medication regimen.  The patient  reports that he does not use drugs. His body mass index is 27.05 kg/m.  Further details on both, my assessment(s), as well as the proposed treatment plan, please see below.  Controlled Substance Pharmacotherapy Assessment REMS (Risk Evaluation and Mitigation Strategy)  Analgesic: Hydrocodone 10-'20mg'$  TIDas needed (maximum 6 tablets a day) MME/day: 60 mg/day.  Landis Martins, RN  01/05/2018 12:44 PM  Sign  at close encounter NNursing Pain Medication Assessment:  Safety precautions to be maintained throughout the outpatient stay will include: orient to surroundings, keep bed in low position, maintain call bell  within reach at all times, provide assistance with transfer out of bed and ambulation.  Medication Inspection Compliance: [name] did not comply with our request to bring his pills to be counted. He was reminded that bringing the medication bottles, even when empty, is a requirement.  Medication: None brought in. Pill/Patch Count: None available to be counted. Bottle Appearance: No container available. Did not bring bottle(s) to appointment. Filled Date: N/A Last Medication intake:  Today Pharmacokinetics: Liberation and absorption (onset of action): WNL Distribution (time to peak effect): WNL Metabolism and excretion (duration of action): WNL         Pharmacodynamics: Desired effects: Analgesia: [name] reports >50% benefit. Functional ability: Patient reports that medication allows him to accomplish basic ADLs Clinically meaningful improvement in function (CMIF): Sustained CMIF goals met Perceived effectiveness: Described as relatively effective, allowing for increase in activities of daily living (ADL) Undesirable effects: Side-effects or Adverse reactions: None reported Monitoring: [place] PMP: Online review of the past 61-monthperiod conducted. Compliant with practice rules and regulations Last UDS on record: Summary  Date Value Ref Range Status  10/26/2017 FINAL  Final    Comment:    ==================================================================== TOXASSURE COMP DRUG ANALYSIS,UR ==================================================================== Test                             Result       Flag       Units Drug Present and Declared for Prescription Verification   Hydrocodone                    2548         EXPECTED   ng/mg creat   Hydromorphone                   124          EXPECTED   ng/mg creat   Dihydrocodeine                 251          EXPECTED   ng/mg creat   Norhydrocodone                 1816         EXPECTED   ng/mg creat    Sources of hydrocodone include scheduled prescription    medications. Hydromorphone, dihydrocodeine and norhydrocodone are    expected metabolites of hydrocodone. Hydromorphone and    dihydrocodeine are also available as scheduled prescription    medications.   Acetaminophen                  PRESENT      EXPECTED Drug Absent but Declared for Prescription Verification   Salicylate                     Not Detected UNEXPECTED    Aspirin, as indicated in the declared medication list, is not    always detected even when used as directed. ==================================================================== Test                      Result    Flag   Units      Ref Range   Creatinine              87               mg/dL      >=20 ==================================================================== Declared Medications:  The flagging and interpretation on this report are based on the  following declared medications.  Unexpected results may arise from  inaccuracies in the declared medications.  **Note: The testing scope of this panel includes these medications:  Hydrocodone (Norco)  **Note: The testing scope of this panel does not include small to  moderate amounts of these reported medications:  Acetaminophen (Norco)  Aspirin (Aspirin 81)  **Note: The testing scope of this panel does not include following  reported medications:  Carvedilol (Coreg)  Clopidogrel (Plavix)  Furosemide (Lasix)  Iron (Ferrous Sulfate)  Lubiprostone (Amitiza)  Nitroglycerin (Nitrostat)  Ondansetron (Zofran)  Oxygen  Pantoprazole (Protonix)  Vitamin C  Vitamin D ==================================================================== For clinical consultation, please call [phone]. ====================================================================    UDS interpretation: Compliant          Medication Assessment Form: Reviewed. Patient indicates being compliant with therapy Treatment compliance: Compliant Risk Assessment Profile: Aberrant behavior: See prior evaluations. None observed or detected today Comorbid factors increasing risk of overdose: See prior notes. No additional risks detected today Opioid risk tool (ORT) (Total Score):   Personal History of Substance Abuse (SUD-Substance use disorder):  Alcohol:    Illegal Drugs:    Rx Drugs:    ORT Risk Level calculation:   Risk of substance use disorder (SUD): Low  ORT Scoring interpretation table:  Score <3 = Low Risk for SUD  Score between 4-7 = Moderate Risk for SUD  Score >8 = High Risk for Opioid Abuse   Risk Mitigation Strategies:  Patient Counseling: Covered Patient-Prescriber Agreement (PPA): Present and active  Notification to other healthcare providers: Done  Pharmacologic Plan: No change in therapy, at this time.             Laboratory Chemistry  Inflammation Markers (CRP: Acute Phase) (ESR: Chronic Phase) Lab Results  Component Value Date   ESRSEDRATE 38 (H) 11/22/2011                         Rheumatology Markers Lab Results  Component Value Date   RF 23 (H) 11/22/2011   ANA NEG 11/22/2011                        Renal Function Markers Lab Results  Component Value Date   BUN 29 (H) 10/17/2017   CREATININE 2.47 (H) 10/17/2017   BCR 13 06/24/2017   GFRAA 30 (L) 06/24/2017   GFRNONAA 26 (L) 06/24/2017                             Hepatic Function Markers Lab Results  Component Value Date   AST 17 06/24/2017   ALT 10 06/24/2017   ALBUMIN 4.1 10/17/2017   ALKPHOS 67 04/29/2017                        Electrolytes Lab Results  Component Value Date   NA 138 10/17/2017   K 4.7 10/17/2017   CL 97 10/17/2017   CALCIUM 9.8 10/17/2017   MG 1.9 04/08/2017   PHOS  3.5 10/17/2017                        Neuropathy Markers Lab Results  Component Value Date   VITAMINB12 253 08/10/2016   FOLATE 17.6 08/10/2016   HGBA1C 8.1 (H) 10/17/2017  CNS Tests No results found for: COLORCSF, APPEARCSF, RBCCOUNTCSF, WBCCSF, POLYSCSF, LYMPHSCSF, EOSCSF, PROTEINCSF, GLUCCSF, JCVIRUS, CSFOLI, IGGCSF                      Bone Pathology Markers No results found for: VD25OH, [id number], [id number], [id number], 25OHVITD1, 25OHVITD2, 25OHVITD3, TESTOFREE, TESTOSTERONE                       Coagulation Parameters Lab Results  Component Value Date   INR 1.12 08/13/2015   LABPROT 14.6 08/13/2015   APTT 29 08/13/2015   PLT 201 06/24/2017                        Cardiovascular Markers Lab Results  Component Value Date   BNP 318.4 (H) 08/13/2015   CKTOTAL 73 12/31/2011   CKMB 3.4 12/31/2011   TROPONINI 0.68 (HH) 08/14/2015   HGB 12.0 (L) 06/24/2017   HCT 38.2 (L) 06/24/2017                         CA Markers No results found for: CEA, CA125, LABCA2                      Note: Lab results reviewed.  Recent Diagnostic Imaging Results  XR KNEE 3 VIEW RIGHT Three-view x-ray of the right knee reveals total knee arthroplasty in good  position alignment.  Good cement mantle is noted.  Posterior vascular  clips is noted on the lateral. XR KNEE 3 VIEW LEFT X-rays left knee reveals marked medial joint space narrowing with  sclerosis of the proximal tibial plateau more than the medial femoral  condyle.  There is periarticular spurring medially off of the tibia as  well as laterally to.  There is patellofemoral joint base narrowing more  medially.  Periarticular spurring both medially and laterally at the  patellofemoral joint.  Complexity Note: Imaging results reviewed. Results shared with [name], using Layman's terms.                         Meds   Current Outpatient Medications:  .  AMITIZA 24 MCG capsule, Take 2 capsules by mouth  daily., Disp: , Rfl:  .  aspirin EC 81 MG tablet, Take 81 mg by mouth at bedtime., Disp: , Rfl:  .  Blood Glucose Monitoring Suppl (ONE TOUCH ULTRA 2) w/Device KIT, Use to obtain blood sugar daily. Dx Code E11.40, Disp: 1 each, Rfl: 0 .  carvedilol (COREG) 3.125 MG tablet, Take 1 tablet (3.125 mg total) by mouth 2 (two) times daily with a meal., Disp: 180 tablet, Rfl: 3 .  cholecalciferol (VITAMIN D) 1000 units tablet, Take 2,000 Units by mouth daily. , Disp: , Rfl:  .  clopidogrel (PLAVIX) 75 MG tablet, TAKE 1 TABLET BY MOUTH DAILY WITH BREAKFAST., Disp: 30 tablet, Rfl: 10 .  DHA-Vitamin C-Lutein (EYE HEALTH FORMULA PO), Take 1 tablet by mouth daily. , Disp: , Rfl:  .  ferrous sulfate 324 (65 Fe) MG TBEC, Take 1 tablet by mouth daily., Disp: , Rfl:  .  furosemide (LASIX) 40 MG tablet, TAKE 1 TO 2 TABLETS BY MOUTH EVERY DAY, Disp: 60 tablet, Rfl: 11 .  glucose blood (ONE TOUCH ULTRA TEST) test strip, USE TO CHECK BLOOD SUGAR ONCE A DAY Dx Code E11.40, Disp: 100 each, Rfl: 3 .  [START ON 02/03/2018] HYDROcodone-acetaminophen (  NORCO) 10-325 MG tablet, Take 1-2 tablets by mouth every 4 (four) hours as needed for severe pain., Disp: 180 tablet, Rfl: 0 .  linaclotide (LINZESS) 145 MCG CAPS capsule, Take 145 mcg by mouth daily before breakfast., Disp: , Rfl:  .  nitroGLYCERIN (NITROSTAT) 0.4 MG SL tablet, DISSOLVE 1 TABLET UNDER THE TONGUE FOR CHEST PAIN. MAY REPEAT EVERY 5MINUTES UP TO 3 DOSES. IF NO RELIEF, CALL 911**, Disp: 25 tablet, Rfl: 5 .  ondansetron (ZOFRAN) 4 MG tablet, TAKE 1 TABLET BY MOUTH EVERY 8 HOURS AS NEEDED FOR NAUSEA OR VOMITING, Disp: 60 tablet, Rfl: 0 .  ONETOUCH DELICA LANCETS 00B MISC, 1 each by In Vitro route daily. Dx Code E11.49, Disp: 100 each, Rfl: 3 .  OXYGEN, Inhale into the lungs. Per pt- Uses Oxygen 2 liters at night., Disp: , Rfl:  .  pantoprazole (PROTONIX) 40 MG tablet, TAKE 1 TABLET BY MOUTH TWICE (2) DAILY, Disp: 60 tablet, Rfl: 11 .  [START ON 03/05/2018]  HYDROcodone-acetaminophen (NORCO) 10-325 MG tablet, Take 1-2 tablets by mouth every 4 (four) hours as needed for severe pain., Disp: 180 tablet, Rfl: 0  ROS  Constitutional: Denies any fever or chills Gastrointestinal: No reported hemesis, hematochezia, vomiting, or acute GI distress Musculoskeletal: Denies any acute onset joint swelling, redness, loss of ROM, or weakness Neurological: No reported episodes of acute onset apraxia, aphasia, dysarthria, agnosia, amnesia, paralysis, loss of coordination, or loss of consciousness  Allergies  [name] is allergic to doxazosin mesylate and methocarbamol.  [place]  Drug: [name]  reports that he does not use drugs. Alcohol:  reports that he does not drink alcohol. Tobacco:  reports that he quit smoking about 54 years ago. His smoking use included cigarettes. He has a 20.00 pack-year smoking history. He has never used smokeless tobacco. Medical:  has a past medical history of Arthritis, CAD (coronary artery disease), Chronic diastolic CHF (congestive heart failure) ([place]), Chronic kidney disease, stage III (moderate) ([place]), Chronic lower back pain, Colon polyps, Diverticulosis, Dyspnea (2009 since July -Sept), Enlarged prostate, Esophageal stricture, GERD (gastroesophageal reflux disease), Heart murmur, Hiatal hernia, History of kidney stones, History of PFTs, Hyperlipidemia, Hypertension, Interstitial lung disease ([place]), Nausea & vomiting, Overweight (BMI 25.0-29.9), RA (rheumatoid arthritis) ([place]), Seropositive rheumatoid arthritis ([place]), and Type II diabetes mellitus ([place]). Surgical: [name]  has a past surgical history that includes Total knee arthroplasty (Right, 03/2010); Knee arthroscopy (Right, 2008); Cataract extraction w/ intraocular lens  implant, bilateral (Bilateral); Coronary artery bypass graft (11/1999); Coronary stent placement (02/2012); Shoulder arthroscopy with open rotator cuff repair and distal clavicle acrominectomy (Left,  02/27/2013); Trigger finger release (Left, 02/27/2013); Cholecystectomy open (11/2003); Joint replacement; Esophagogastroduodenoscopy (egd) with esophageal dilation (2010); Cardiac catheterization (08/2004); Cardiac catheterization (12/31/2011); Coronary angioplasty with stent (01/03/2012); Coronary angioplasty with stent (01/14/2014); Cardiac catheterization (2009); left and right heart catheterization with coronary angiogram (12/31/2011); percutaneous coronary stent intervention (pci-s) (12/31/2011); percutaneous coronary stent intervention (pci-s) (N/A, 01/03/2012); Percutaneous coronary intervention-balloon only (01/03/2012); left and right heart catheterization with coronary angiogram (N/A, 01/14/2014); Hand surgery; Cardiac catheterization (N/A, 08/13/2015); Esophagogastroduodenoscopy (N/A, 03/01/2017); and maloney dilation (03/01/2017). Family: family history includes Alcohol abuse in his sister; COPD in his mother; Colon cancer (age of onset: 38) in his brother; Diabetes in his brother; Heart attack in his father; Heart disease in his father; Stroke in his sister.  Constitutional Exam  General appearance: Well nourished, well developed, and well hydrated. In no apparent acute distress Vitals:   01/05/18 1239  BP: (!) 163/78  Pulse: (!) 58  Resp: 16  Temp: 98 F (36.7 C)  TempSrc: Oral  SpO2: 96%  Weight: 205 lb (93 kg)  Height: '6\' 1"'$  (1.854 m)   BMI Assessment: Estimated body mass index is 27.05 kg/m as calculated from the following:   Height as of this encounter: '6\' 1"'$  (1.854 m).   Weight as of this encounter: 205 lb (93 kg).  BMI interpretation table: BMI level Category Range association with higher incidence of chronic pain  <18 kg/m2 Underweight   18.5-24.9 kg/m2 Ideal body weight   25-29.9 kg/m2 Overweight Increased incidence by 20%  30-34.9 kg/m2 Obese (Class I) Increased incidence by 68%  35-39.9 kg/m2 Severe obesity (Class II) Increased incidence by 136%  >40 kg/m2 Extreme  obesity (Class III) Increased incidence by 254%   Patient's current BMI Ideal Body weight  Body mass index is 27.05 kg/m. Ideal body weight: 79.9 kg (176 lb 2.4 oz) Adjusted ideal body weight: 85.1 kg (187 lb 11 oz)   BMI Readings from Last 4 Encounters:  01/05/18 27.05 kg/m  12/22/17 26.12 kg/m  11/21/17 26.39 kg/m  10/26/17 26.12 kg/m   Wt Readings from Last 4 Encounters:  01/05/18 205 lb (93 kg)  12/22/17 198 lb (89.8 kg)  11/21/17 200 lb (90.7 kg)  10/26/17 198 lb (89.8 kg)  Psych/Mental status: Alert, oriented x 3 (person, place, & time)       Eyes: PERLA Respiratory: No evidence of acute respiratory distress  Cervical Spine Area Exam  Skin & Axial Inspection: No masses, redness, edema, swelling, or associated skin lesions Alignment: Symmetrical Functional ROM: Unrestricted ROM      Stability: No instability detected Muscle Tone/Strength: Functionally intact. No obvious neuro-muscular anomalies detected. Sensory (Neurological): Unimpaired Palpation: No palpable anomalies              Upper Extremity (UE) Exam    Side: Right upper extremity  Side: Left upper extremity  Skin & Extremity Inspection: Skin color, temperature, and hair growth are WNL. No peripheral edema or cyanosis. No masses, redness, swelling, asymmetry, or associated skin lesions. No contractures.  Skin & Extremity Inspection: Skin color, temperature, and hair growth are WNL. No peripheral edema or cyanosis. No masses, redness, swelling, asymmetry, or associated skin lesions. No contractures.  Functional ROM: Unrestricted ROM          Functional ROM: Unrestricted ROM          Muscle Tone/Strength: Functionally intact. No obvious neuro-muscular anomalies detected.  Muscle Tone/Strength: Functionally intact. No obvious neuro-muscular anomalies detected.  Sensory (Neurological): Unimpaired          Sensory (Neurological): Unimpaired          Palpation: No palpable anomalies              Palpation: No palpable  anomalies              Provocative Test(s):  Phalen's test: deferred Tinel's test: deferred Apley's scratch test (touch opposite shoulder):  Action 1 (Across chest): deferred Action 2 (Overhead): deferred Action 3 (LB reach): deferred   Provocative Test(s):  Phalen's test: deferred Tinel's test: deferred Apley's scratch test (touch opposite shoulder):  Action 1 (Across chest): deferred Action 2 (Overhead): deferred Action 3 (LB reach): deferred    Thoracic Spine Area Exam  Skin & Axial Inspection: No masses, redness, or swelling Alignment: Symmetrical Functional ROM: Unrestricted ROM Stability: No instability detected Muscle Tone/Strength: Functionally intact. No obvious neuro-muscular anomalies detected. Sensory (Neurological): Unimpaired Muscle strength & Tone:  No palpable anomalies  Lumbar Spine Area Exam  Skin & Axial Inspection: No masses, redness, or swelling Alignment: Symmetrical Functional ROM: Unrestricted ROM       Stability: No instability detected Muscle Tone/Strength: Functionally intact. No obvious neuro-muscular anomalies detected. Sensory (Neurological): Unimpaired Palpation: No palpable anomalies       Provocative Tests: Hyperextension/rotation test: deferred today       Lumbar quadrant test (Kemp's test): deferred today       Lateral bending test: deferred today       Patrick's Maneuver: deferred today                   FABER test: deferred today                   S-I anterior distraction/compression test: deferred today         S-I lateral compression test: deferred today         S-I Thigh-thrust test: deferred today         S-I Gaenslen's test: deferred today          Gait & Posture Assessment  Ambulation: Unassisted Gait: Relatively normal for age and body habitus Posture: WNL   Lower Extremity Exam    Side: Right lower extremity  Side: Left lower extremity  Stability: No instability observed          Stability: No instability observed           Skin & Extremity Inspection: Skin color, temperature, and hair growth are WNL. No peripheral edema or cyanosis. No masses, redness, swelling, asymmetry, or associated skin lesions. No contractures.  Skin & Extremity Inspection: Skin color, temperature, and hair growth are WNL. No peripheral edema or cyanosis. No masses, redness, swelling, asymmetry, or associated skin lesions. No contractures.  Functional ROM: Unrestricted ROM                  Functional ROM: Unrestricted ROM                  Muscle Tone/Strength: Functionally intact. No obvious neuro-muscular anomalies detected.  Muscle Tone/Strength: Functionally intact. No obvious neuro-muscular anomalies detected.  Sensory (Neurological): Neuropathic pain pattern  Sensory (Neurological): Neuropathic pain pattern  Palpation: No palpable anomalies  Palpation: No palpable anomalies   Assessment  Primary Diagnosis & Pertinent Problem List: The primary encounter diagnosis was Chronic pain of both lower extremities. Diagnoses of Neuropathic pain, Chronic pain syndrome, Long term prescription opiate use, Diabetic polyneuropathy associated with type 2 diabetes mellitus ([place]), Neuropathy, Seropositive rheumatoid arthritis ([place]), and CKD stage 3 due to type 2 diabetes mellitus ([place]) were also pertinent to this visit.  Status Diagnosis  Controlled Controlled Controlled 1. Chronic pain of both lower extremities   2. Neuropathic pain   3. Chronic pain syndrome   4. Long term prescription opiate use   5. Diabetic polyneuropathy associated with type 2 diabetes mellitus ([name])   6. Neuropathy   7. Seropositive rheumatoid arthritis ([place])   8. CKD stage 3 due to type 2 diabetes mellitus ([place])      General Recommendations: The pain condition that the patient suffers from is best treated with a multidisciplinary approach that involves an increase in physical activity to prevent de-conditioning and worsening of the pain cycle, as well as psychological  counseling (formal and/or informal) to address the co-morbid psychological affects of pain. Treatment will often involve judicious use of pain medications and interventional procedures to decrease the pain, allowing the  patient to participate in the physical activity that will ultimately produce long-lasting pain reductions. The goal of the multidisciplinary approach is to return the patient to a higher level of overall function and to restore their ability to perform activities of daily living.  84 year old male with a history of coronary artery disease, GERD, interstitial lung disease, rheumatoid arthritis, aortic stenosis, chronic kidney disease stage III who presents with lower extremity neuropathic pain secondary to diabetic polyneuropathy. Patient's diagnosis of diabetes was over 10-15 years ago. He is not on insulin. He describes burning and tingling sensation in his toes. This is worsened over time. This is progressed to the dorsum of his foot. Patient has been tried on various medications including desipramine, lidocaine cream, gabapentin at a dose of 900 mill grams in the morning, 600 mg in the afternoon, 600 mg at bedtime. This dose had to be reduced secondary to his chronic kidney disease. Patient was also tried on Lyrica 100 mgtwice daily but it was too expensive for the patient to afford. Furthermore Lyrica resulted in leg swelling and further dose escalation was limited by patient's chronic kidney disease stage III. It was not very effective for his neuropathic pain symptoms. Patient also has tried carbamazepine which was not effective for his neuropathic pain. Patient has also had injections in his foot for his pain symptoms which were not effective.   Today, we will refill the patient's Hydrocodone as below. No dose change. UDS up-to-date and appropriate.  [place] PMP checked and appropriate.  Plan of Care  Pharmacotherapy (Medications Ordered): Meds ordered this  encounter  Medications  . HYDROcodone-acetaminophen (NORCO) 10-325 MG tablet    Sig: Take 1-2 tablets by mouth every 4 (four) hours as needed for severe pain.    Dispense:  180 tablet    Refill:  0    Do not place this medication, or any other prescription from our practice, on "Automatic Refill". Patient may have prescription filled one day early if pharmacy is closed on scheduled refill date.  [name] HYDROcodone-acetaminophen (NORCO) 10-325 MG tablet    Sig: Take 1-2 tablets by mouth every 4 (four) hours as needed for severe pain.    Dispense:  180 tablet    Refill:  0    Do not place this medication, or any other prescription from our practice, on "Automatic Refill". Patient may have prescription filled one day early if pharmacy is closed on scheduled refill date.    Provider-requested follow-up: Return in about 10 weeks (around 03/16/2018) for Medication Management.  Time Note:Greater than 50% of the 52mnute(s) of face-to-face time spent with MichealMcdonald, was spent in counseling/coordination of care regarding:MichealMcdonald's primary cause of pain, the treatment plan, medication side effects, the opioid analgesic risks and possible complications, the appropriate use ofhismedications, the medication agreement and the patient's responsibilities when it comes to controlled substances.  Future Appointments  Date Time Provider [place] 01/10/2018  8:30 AM [facility]  01/19/2018 10:00 AM NJosue Hector MD [facility]  02/09/2018  2:30 PM [facility] NURSE [facility]  03/21/2018 12:15 PM LGillis Santa MD [facility] None  04/28/2018 11:30 AM LVenia Carbon MD [facility]    Primary Care Physician: LVenia Carbon MD [facility] Note by: BGillis Santa M.D Date: 01/05/2018; Time: 3:25 PM  Patient Instructions  You were given 2 prescriptions for Hydrocodone today.

## 2018-01-05 NOTE — Progress Notes (Signed)
NNursing Pain Medication Assessment:  Safety precautions to be maintained throughout the outpatient stay will include: orient to surroundings, keep bed in low position, maintain call bell within reach at all times, provide assistance with transfer out of bed and ambulation.  Medication Inspection Compliance: [name] did not comply with our request to bring his pills to be counted. He was reminded that bringing the medication bottles, even when empty, is a requirement.  Medication: None brought in. Pill/Patch Count: None available to be counted. Bottle Appearance: No container available. Did not bring bottle(s) to appointment. Filled Date: N/A Last Medication intake:  Today

## 2018-01-05 NOTE — Patient Instructions (Signed)
You were given 2 prescriptions for Hydrocodone today.

## 2018-01-06 ENCOUNTER — Other Ambulatory Visit: Payer: Self-pay | Admitting: Internal Medicine

## 2018-01-10 ENCOUNTER — Other Ambulatory Visit (HOSPITAL_COMMUNITY): Payer: Medicare Other

## 2018-01-10 NOTE — Progress Notes (Signed)
Cardiology Office Note:    Date:  01/19/2018   ID:  [name], DOB Dec 12, 1931, MRN [id number]  PCP:  [name], MD  Cardiologist:  [name], MD   Referring MD: [name], MD   No chief complaint on file.   History of Present Illness:    [name] is a 84 y.o. male with a hx of CAD status post CABG and subsequent PCI with DES to the SVG-RCA and cutting balloon angioplasty to the ostial PDA in September 2013, interstitial lung disease, aortic stenosis, chronic kidney disease, rheumatoid arthritis, hypertension, hyperlipidemia, diabetes.  He underwent PCI in October 2015 with DES to the SVG-OM2 secondary to in-stent restenosis.  His last cardiac catheterization was in 5/17 and demonstrated 99% in-stent restenosis in the proximal SVG-OM1/OM2.  This was treated with DES.   Chronic complaints of dyspnea , GERD and fatigue  Lots of nausea and GI issues with esophageal dilatation Dr Henrene Pastor 03/01/17 Chronic pain syndrome LE;s due to arthritis and neuropathy Seeing pain management for oxycodone    04/12/17 Event monitor reveiwed SR benign PACls and PVC;s no runs   Has had some dysphagia Seen by [facility] previous esophageal dilatation ? Want to use Botox now   Prior CV studies:   The following studies were reviewed today:  Echo 01/17/17 Mild LVH, EF 60-65, inf AK, Gr 1 DD, mild to mod AS (mean 18, peak 32), mild AI  Cardiac Catheterization 08/13/15 LM ost 80 LAD prox 100 OM2 90 RCA prox 100 L-LAD ok S-OM1/OM2 prox 99 ISR S-RCA 100 (CTO) S-D1 prox 100 (CTO) PCI:  3.5 x 12 mm Resolute DES to S-OM1/OM2      Nuclear stress test 01/08/14 Overall Impression:  High risk stress nuclear study with two perfusion defects: 1. Medium size severe severity ischemia in the mid LAD territory (SDS 8) and 2. Large scar in the entire inferior, and basal and mid inferolateral walls.  LV Ejection Fraction: 45%.  LV Wall Motion:  Paradoxical septal motion and akinesis of the  basal and mid inferior and inferolateral walls.    Past Medical History:  Diagnosis Date  . Arthritis    osteoarthritis, s/p R TKR, and digits  . CAD (coronary artery disease)    a. s/p CABG (2001)  b. s/p DES to RCA and cutting POBA to ostial PDA (2013)   c. s/p DES to SVG to OM2 (01/14/14) d. cath: 08/2015 NSTEMI w/ patent LIMA-LAD and 99% stenosis of SVG-OM w/ DES placed. CTO of SVG-RCA and SVG-D1.   [name] Chronic diastolic CHF (congestive heart failure) ([place])    a) 09/13 ECHO- LVEF 38-25%, grade 1 diastolic dysfunction, mild LA dilatation, atrial septal aneurysm, AV mobility restricted, but no sig AS by doppler; b) 09/04/08 ECHO- LVH, ef 60%, mild AS, c. echo 08/2015: EF perserved of 55-60% with inferolateral HK. Mild AS noted.  . Chronic kidney disease, stage III (moderate) ([place])   . Chronic lower back pain   . Colon polyps   . Diverticulosis   . Dyspnea 2009 since July -Sept   05/06/08-CPST-  normal effort, reduced VO2 max 20.5 /65%, reduced at 8.2/ 40%, normal breathing resetvca of 55%, submaximal heart rate response 112/77%, flattened o2 pluse response at peak exercise-12 ml/beat @ 85%, No VQ mismatch abnormalities, All c/w CIRC Limitation  . Enlarged prostate   . Esophageal stricture    a. s/p dilation spring 2010  . GERD (gastroesophageal reflux disease)   . Heart murmur   .  Hiatal hernia   . History of kidney stones   . History of PFTs    mixed pattern on spiro. mild restn on lung volumes with near normal DLCO. Pattern can be explained by CABG scar. Fev1 2.2L/73%, ratio 68 (67), TLC 4.7/68%,RV 1.5L/55%,DLCO 79%  . Hyperlipidemia   . Hypertension   . Interstitial lung disease ([name])    NOS  . Nausea & vomiting    2018/2019  . Overweight (BMI 25.0-29.9)    BMI 29  . RA (rheumatoid arthritis) ([name])    [name]  . Seropositive rheumatoid arthritis ([place])   . Type II diabetes mellitus ([place])     Past Surgical History:  Procedure Laterality Date  . CARDIAC CATHETERIZATION   08/2004   CP- no MI, Cath- small vessell disease   . CARDIAC CATHETERIZATION  12/31/2011   80% distal LM, 100% native LAD, LCx and RCA, 30% prox SVG-OM, SVG-D1 normal, 99% distal, 80% ostial SVG-RCA distal to graft, LIMA-LAD normal; LVEF mildly decreased with posterior basal AK   . CARDIAC CATHETERIZATION  2009   with patent grafts/notes 12/31/2011  . CARDIAC CATHETERIZATION N/A 08/13/2015   Procedure: Left Heart Cath and Cors/Grafts Angiography;  Surgeon: Sherren Mocha, MD;  Location: [place] [facility];  Service: Cardiovascular;  Laterality: N/A;  . CATARACT EXTRACTION W/ INTRAOCULAR LENS  IMPLANT, BILATERAL Bilateral   . CHOLECYSTECTOMY OPEN  11/2003   [name]  . CORONARY ANGIOPLASTY WITH STENT PLACEMENT  01/03/2012   Successful DES to SVG-RCA and cutting balloon angioplasty ostial  PDA   . CORONARY ANGIOPLASTY WITH STENT PLACEMENT  01/14/2014   "1"  . CORONARY ARTERY BYPASS GRAFT  11/1999   CABG X5  . CORONARY STENT PLACEMENT  02/2012   1 stent and balloon  . ESOPHAGOGASTRODUODENOSCOPY N/A 03/01/2017   Procedure: ESOPHAGOGASTRODUODENOSCOPY (EGD);  Surgeon: Irene Shipper, MD;  Location[facility];  Service: Endoscopy;  Laterality: N/A;  . ESOPHAGOGASTRODUODENOSCOPY (EGD) WITH ESOPHAGEAL DILATION  2010  . HAND SURGERY    . JOINT REPLACEMENT    . KNEE ARTHROSCOPY Right 2008  . LEFT AND RIGHT HEART CATHETERIZATION WITH CORONARY ANGIOGRAM  12/31/2011   Procedure: LEFT AND RIGHT HEART CATHETERIZATION WITH CORONARY ANGIOGRAM;  Surgeon: Burnell Blanks, MD;  Location[facility] LAB;  Service: Cardiovascular;;  . LEFT AND RIGHT HEART CATHETERIZATION WITH CORONARY ANGIOGRAM N/A 01/14/2014   Procedure: LEFT AND RIGHT HEART CATHETERIZATION WITH CORONARY ANGIOGRAM;  Surgeon: Siriah Treat M Martinique, MD;  Location: [facility] CATH LAB;  Service: Cardiovascular;  Laterality: N/A;  . MALONEY DILATION  03/01/2017   Procedure: Venia Minks DILATION;  Surgeon: Irene Shipper, MD;  Location: [facility];  Service:  Endoscopy;;  . PERCUTANEOUS CORONARY INTERVENTION-BALLOON ONLY  01/03/2012   Procedure: PERCUTANEOUS CORONARY INTERVENTION-BALLOON ONLY;  Surgeon: Miklo Aken M Martinique, MD;  Location: [facility] CATH LAB;  Service: Cardiovascular;;  . PERCUTANEOUS CORONARY STENT INTERVENTION (PCI-S)  12/31/2011   Procedure: PERCUTANEOUS CORONARY STENT INTERVENTION (PCI-S);  Surgeon: Burnell Blanks, MD;  Location: [facility] CATH LAB;  Service: Cardiovascular;;  . PERCUTANEOUS CORONARY STENT INTERVENTION (PCI-S) N/A 01/03/2012   Procedure: PERCUTANEOUS CORONARY STENT INTERVENTION (PCI-S);  Surgeon: Quetzaly Ebner M Martinique, MD;  Location: [facility] CATH LAB;  Service: Cardiovascular;  Laterality: N/A;  . SHOULDER ARTHROSCOPY WITH OPEN ROTATOR CUFF REPAIR AND DISTAL CLAVICLE ACROMINECTOMY Left 02/27/2013   Procedure: LEFT SHOULDER ARTHROSCOPY WITH MINI OPEN ROTATOR CUFF REPAIR AND SUBACROMIAL DECOMPRESSION AND DISTAL CLAVICLE RESECTION;  Surgeon: [name], MD;  Location: [place];  Service: Orthopedics;  Laterality:  Left;  . TOTAL KNEE ARTHROPLASTY Right 03/2010   [name]  . TRIGGER FINGER RELEASE Left 02/27/2013   Procedure: RELEASE TRIGGER FINGER/A-1 PULLEY;  Surgeon: [name], MD;  Location: [place];  Service: Orthopedics;  Laterality: Left;    Current Medications: Current Meds  Medication Sig  . AMITIZA 24 MCG capsule Take 2 capsules by mouth daily.  [name] aspirin EC 81 MG tablet Take 81 mg by mouth at bedtime.  . Blood Glucose Monitoring Suppl (ONE TOUCH ULTRA 2) w/Device KIT Use to obtain blood sugar daily. Dx Code E11.40  . carvedilol (COREG) 3.125 MG tablet TAKE 1 TABLET BY MOUTH TWICE (2) DAILY WITH MEALS  . cholecalciferol (VITAMIN D) 1000 units tablet Take 2,000 Units by mouth daily.   . clopidogrel (PLAVIX) 75 MG tablet TAKE 1 TABLET BY MOUTH ONCE DAILY WITH BREAKFAST  . DHA-Vitamin C-Lutein (EYE HEALTH FORMULA PO) Take 1 tablet by mouth daily.   . ferrous sulfate 324 (65 Fe) MG TBEC Take 1 tablet by mouth daily.  .  furosemide (LASIX) 40 MG tablet TAKE 1 TO 2 TABLETS BY MOUTH EVERY DAY  . glucose blood (ONE TOUCH ULTRA TEST) test strip USE TO CHECK BLOOD SUGAR ONCE A DAY Dx Code E11.40  . [START ON 02/03/2018] HYDROcodone-acetaminophen (NORCO) 10-325 MG tablet Take 1-2 tablets by mouth every 4 (four) hours as needed for severe pain.  Derrill Memo ON 03/05/2018] HYDROcodone-acetaminophen (NORCO) 10-325 MG tablet Take 1-2 tablets by mouth every 4 (four) hours as needed for severe pain.  [name] linaclotide (LINZESS) 145 MCG CAPS capsule Take 145 mcg by mouth daily before breakfast.  . nitroGLYCERIN (NITROSTAT) 0.4 MG SL tablet DISSOLVE 1 TABLET UNDER TONGUE AS NEEDEDFOR CHEST PAIN. MAY REPEAT 5 MINUTES APART 3 TIMES IF NEEDED. IF NO RELIEF CALL 911  . ondansetron (ZOFRAN) 4 MG tablet TAKE 1 TABLET BY MOUTH EVERY 8 HOURS AS NEEDED FOR NAUSEA OR VOMITING  . ONETOUCH DELICA LANCETS 16X MISC 1 each by In Vitro route daily. Dx Code E11.49  . OXYGEN Inhale into the lungs. Per pt- Uses Oxygen 2 liters at night.  . pantoprazole (PROTONIX) 40 MG tablet TAKE 1 TABLET BY MOUTH TWICE (2) DAILY     Allergies:   Doxazosin mesylate and Methocarbamol   Social History   Tobacco Use  . Smoking status: Former Smoker    Packs/day: 1.00    Years: 20.00    Pack years: 20.00    Types: Cigarettes    Last attempt to quit: 04/06/1963    Years since quitting: 54.8  . Smokeless tobacco: Never Used  Substance Use Topics  . Alcohol use: No    Alcohol/week: 0.0 standard drinks    Comment: 01/01/2012 "last alcohol ~ 50 yr ago"  . Drug use: No     Family Hx: The patient's family history includes Alcohol abuse in his sister; COPD in his mother; Colon cancer (age of onset: 80) in his brother; Diabetes in his brother; Heart attack in his father; Heart disease in his father; Stroke in his sister.  ROS:   Please see the history of present illness.    ROS All other systems reviewed and are negative.   EKGs/Labs/Other Test Reviewed:    EKG:   Normal sinus rhythm, HR 73, normal axis, QTC 451 ms,  PVC no change from prior tracing  Recent Labs: 04/08/2017: Magnesium 1.9; NT-Pro BNP 626; TSH 2.100 06/24/2017: ALT 10; Hemoglobin 12.0; Platelets 201 10/17/2017: BUN 29; Creatinine, Ser 2.47; Potassium 4.7;  Sodium 138   Recent Lipid Panel Lab Results  Component Value Date/Time   CHOL 138 10/17/2017 11:51 AM   TRIG 233.0 (H) 10/17/2017 11:51 AM   HDL 34.40 (L) 10/17/2017 11:51 AM   CHOLHDL 4 10/17/2017 11:51 AM   LDLCALC 43 10/14/2016 02:29 PM   LDLDIRECT 73.0 10/17/2017 11:51 AM    Physical Exam:    VS:  BP (!) 168/82   Pulse (!) 56   Ht _0  (1.854 m)   Wt 190 lb 1.9 oz (86.2 kg)   SpO2 95%   BMI 25.08 kg/m     Wt Readings from Last 3 Encounters:  01/19/18 190 lb 1.9 oz (86.2 kg)  01/05/18 205 lb (93 kg)  12/22/17 198 lb (89.8 kg)    Affect appropriate Chronically ill octogenarian  HEENT: normal Neck supple with no adenopathy JVP normal no bruits no thyromegaly Lungs clear with no wheezing and good diaphragmatic motion Heart:  S1/S2 SEM  murmur, no rub, gallop or click PMI normal Abdomen: benighn, BS positve, no tenderness, no AAA no bruit.  No HSM or HJR Distal pulses intact with no bruits No edema Neuro non-focal Skin warm and dry Arthritis both knees and distal neuropathy both legs    ASSESSMENT:    No diagnosis found.  PLAN:    In order of problems listed above:  1. Palpitations  Normal LV function event monitor benign continue beta blocker   2. Shortness of breath He has chronic shortness of breath with associated chest discomfort.  This is unchanged over many years.  He is followed by pulmonology (Dr. Chase Caller).  He has a history of interstitial lung disease. BNP mildly elevated lasix adjusted   3. Coronary artery disease History of CABG.  He has had subsequent stenting to the vein graft to the RCA and most recently in 2017 drug-eluting stent to the vein graft to the OM1/OM2.  He has chronic  chest discomfort without significant change.  His ECG is unchanged.  No further testing indicated at this time.  Continue aspirin, beta-blocker, Plavix.  4. Aortic valve stenosis, etiology of cardiac valve disease unspecified Mild to moderate aortic stenosis by echocardiogram in October 2018. Mean greadient 18 mmHg   F/U TTE ordered   5. ILD (interstitial lung disease) ([place]) Continue follow-up with pulmonology as planned.  6. CKD stage 3 due to type 2 diabetes mellitus ([name])   - Baseline Cr 2.4 f/u nephrology   7. Nausea and vomiting, intractability of vomiting not specified, unspecified vomiting type Post EGD with esophageal dilatation Henrene Pastor 02/2017 still needs anemia w/u on iron will f/u with GI [facility] [place] had colonoscopy in May Ok to hold plavix 5 days before f/u EGD and botox Rx   [name]

## 2018-01-12 ENCOUNTER — Telehealth: Payer: Self-pay

## 2018-01-12 NOTE — Telephone Encounter (Signed)
   [facility] Pre-operative Risk Assessment    Request for surgical clearance:  1. What type of surgery is being performed? EGD   2. When is this surgery scheduled?  TBD   3. What type of clearance is required (medical clearance vs. Pharmacy clearance to hold med vs. Both)? Pharmacy  4. Are there any medications that need to be held prior to surgery and how long? Plavix   5. Practice name and name of physician performing surgery?  [facility]   6. What is your office phone number [phone]     7.   What is your office fax number  [phone]  8.   Anesthesia type (None, local, MAC, general) ? Unknown  [name] 01/12/2018, 2:23 PM  _________________________________________________________________

## 2018-01-13 NOTE — Telephone Encounter (Signed)
Left a detailed message re appt for surgical clearance, for pt to call back and schedule an appt.

## 2018-01-13 NOTE — Telephone Encounter (Signed)
   Primary Cardiologist:Peter Johnsie Cancel, MD  Chart reviewed as part of pre-operative protocol coverage. Because of [name] past medical history and time since last visit, he/she will require a follow-up visit in order to better assess preoperative cardiovascular risk.  Pre-op covering staff: - Please schedule appointment and call patient to inform them. - Please contact requesting surgeon's office via preferred method (i.e, phone, fax) to inform them of need for appointment prior to surgery.  Cecilie Kicks, NP  01/13/2018, 2:33 PM

## 2018-01-15 DIAGNOSIS — J8410 Pulmonary fibrosis, unspecified: Secondary | ICD-10-CM | POA: Diagnosis not present

## 2018-01-15 DIAGNOSIS — R0689 Other abnormalities of breathing: Secondary | ICD-10-CM | POA: Diagnosis not present

## 2018-01-16 DIAGNOSIS — C4442 Squamous cell carcinoma of skin of scalp and neck: Secondary | ICD-10-CM | POA: Diagnosis not present

## 2018-01-16 DIAGNOSIS — L570 Actinic keratosis: Secondary | ICD-10-CM | POA: Diagnosis not present

## 2018-01-16 DIAGNOSIS — L820 Inflamed seborrheic keratosis: Secondary | ICD-10-CM | POA: Diagnosis not present

## 2018-01-17 ENCOUNTER — Other Ambulatory Visit: Payer: Self-pay | Admitting: Cardiovascular Disease

## 2018-01-17 ENCOUNTER — Other Ambulatory Visit: Payer: Self-pay | Admitting: Internal Medicine

## 2018-01-17 NOTE — Telephone Encounter (Signed)
Pt has appt with Dr. Johnsie Cancel 01/19/18. I will remove from the call back pool.

## 2018-01-18 DIAGNOSIS — J849 Interstitial pulmonary disease, unspecified: Secondary | ICD-10-CM | POA: Diagnosis not present

## 2018-01-18 DIAGNOSIS — N183 Chronic kidney disease, stage 3 (moderate): Secondary | ICD-10-CM | POA: Diagnosis not present

## 2018-01-18 DIAGNOSIS — K222 Esophageal obstruction: Secondary | ICD-10-CM | POA: Diagnosis not present

## 2018-01-18 DIAGNOSIS — M069 Rheumatoid arthritis, unspecified: Secondary | ICD-10-CM | POA: Diagnosis not present

## 2018-01-18 DIAGNOSIS — N184 Chronic kidney disease, stage 4 (severe): Secondary | ICD-10-CM | POA: Diagnosis not present

## 2018-01-19 ENCOUNTER — Encounter: Payer: Self-pay | Admitting: Cardiovascular Disease

## 2018-01-19 ENCOUNTER — Ambulatory Visit (INDEPENDENT_AMBULATORY_CARE_PROVIDER_SITE_OTHER): Payer: Medicare Other | Admitting: Cardiovascular Disease

## 2018-01-19 VITALS — BP 168/82 | HR 56 | Ht 73.0 in | Wt 190.1 lb

## 2018-01-19 DIAGNOSIS — I2510 Atherosclerotic heart disease of native coronary artery without angina pectoris: Secondary | ICD-10-CM | POA: Diagnosis not present

## 2018-01-19 NOTE — Patient Instructions (Signed)
Medication Instructions:  Your physician recommends that you continue on your current medications as directed. Please refer to the Current Medication list given to you today.  Labwork: NONE  Testing/Procedures: Your physician has requested that you have an echocardiogram as soon as possible. Echocardiography is a painless test that uses sound waves to create images of your heart. It provides your doctor with information about the size and shape of your heart and how well your heart's chambers and valves are working. This procedure takes approximately one hour. There are no restrictions for this procedure.  Follow-Up: Your physician wants you to follow-up in: 6 months with Dr. Johnsie Cancel. You will receive a reminder letter in the mail two months in advance. If you don't receive a letter, please call our office to schedule the follow-up appointment.   If you need a refill on your cardiac medications before your next appointment, please call your pharmacy.

## 2018-01-26 ENCOUNTER — Ambulatory Visit (HOSPITAL_COMMUNITY): Payer: Medicare Other | Attending: Internal Medicine

## 2018-01-26 ENCOUNTER — Other Ambulatory Visit: Payer: Self-pay

## 2018-01-26 DIAGNOSIS — I25810 Atherosclerosis of coronary artery bypass graft(s) without angina pectoris: Secondary | ICD-10-CM | POA: Diagnosis not present

## 2018-01-26 DIAGNOSIS — I350 Nonrheumatic aortic (valve) stenosis: Secondary | ICD-10-CM | POA: Diagnosis not present

## 2018-01-26 DIAGNOSIS — R0602 Shortness of breath: Secondary | ICD-10-CM | POA: Insufficient documentation

## 2018-01-26 DIAGNOSIS — R002 Palpitations: Secondary | ICD-10-CM | POA: Insufficient documentation

## 2018-01-30 ENCOUNTER — Encounter: Payer: Medicare Other | Admitting: Student in an Organized Health Care Education/Training Program

## 2018-02-09 ENCOUNTER — Ambulatory Visit (INDEPENDENT_AMBULATORY_CARE_PROVIDER_SITE_OTHER): Payer: Medicare Other

## 2018-02-09 DIAGNOSIS — Z23 Encounter for immunization: Secondary | ICD-10-CM

## 2018-02-15 DIAGNOSIS — J8410 Pulmonary fibrosis, unspecified: Secondary | ICD-10-CM | POA: Diagnosis not present

## 2018-02-15 DIAGNOSIS — R0689 Other abnormalities of breathing: Secondary | ICD-10-CM | POA: Diagnosis not present

## 2018-02-25 IMAGING — CT CT CHEST HIGH RESOLUTION W/O CM
2 of 7 series · 15 of 36 positions shown, 18 images · non-contrast
Comparison: Multiple chest CT exams dating back to 01/19/2008.

CLINICAL DATA: Interstitial lung disease. Increasing shortness of
breath over the past year. On oxygen at night.

EXAM:
CT CHEST WITHOUT CONTRAST
TECHNIQUE: Multidetector CT imaging of the chest was performed following the
standard protocol without intravenous contrast. High resolution
imaging of the lungs, as well as inspiratory and expiratory imaging,
was performed.

[Series 4: high resolution · axial · 0.63mm/px · z∈[-308,-36]mm · 12 of 152 slices shown, 15 images]
[im 8/152  mediastinal]
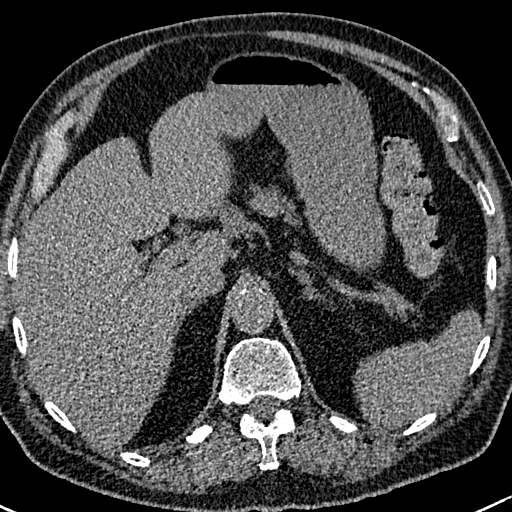
[im 8/152  lung]
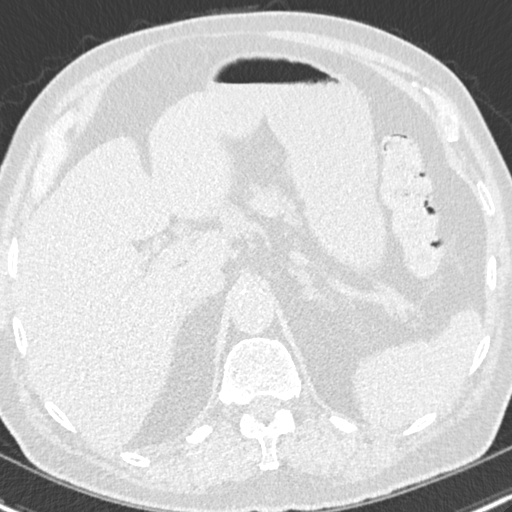
[im 24/152  lung]
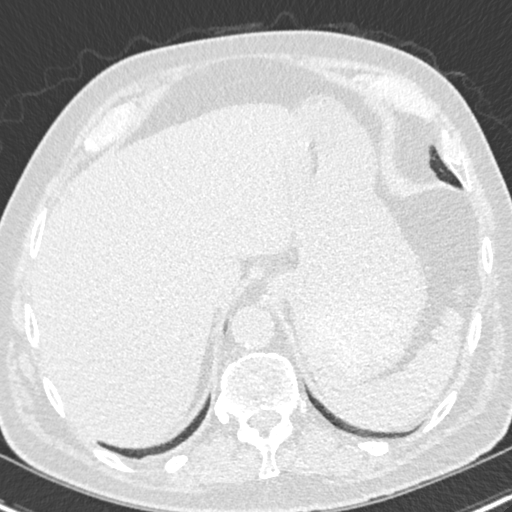
[im 32/152  lung]
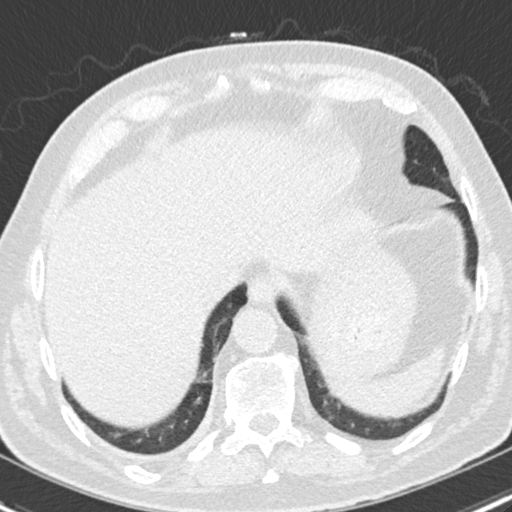
[im 48/152  lung]
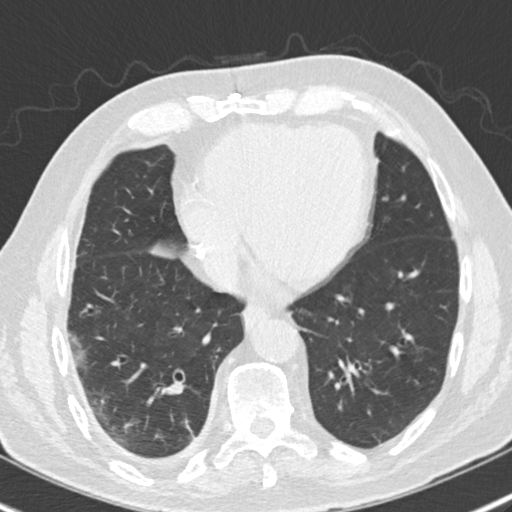
[im 56/152  mediastinal]
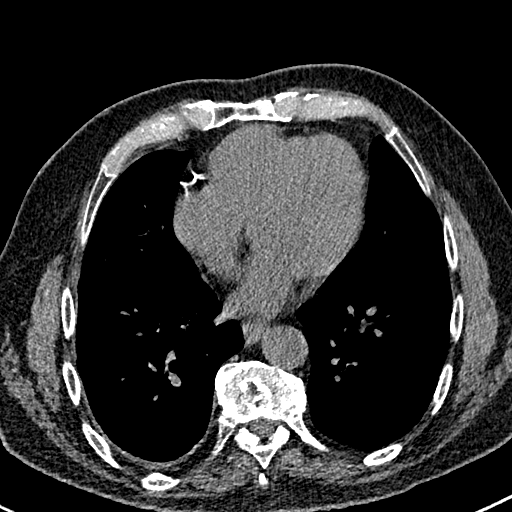
[im 56/152  lung]
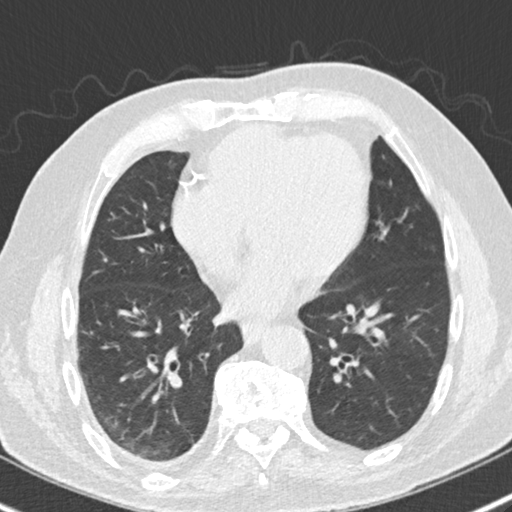
[im 72/152  lung]
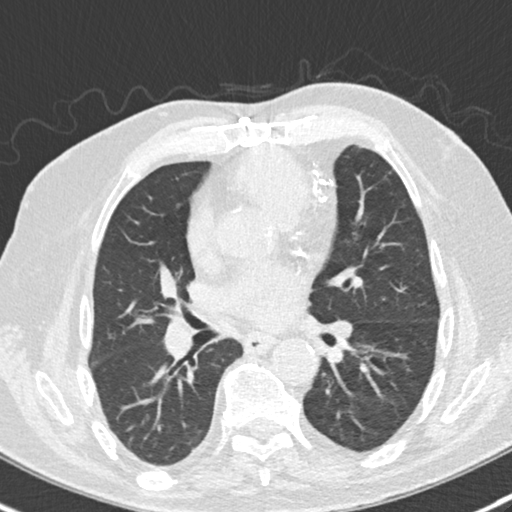
[im 80/152  lung]
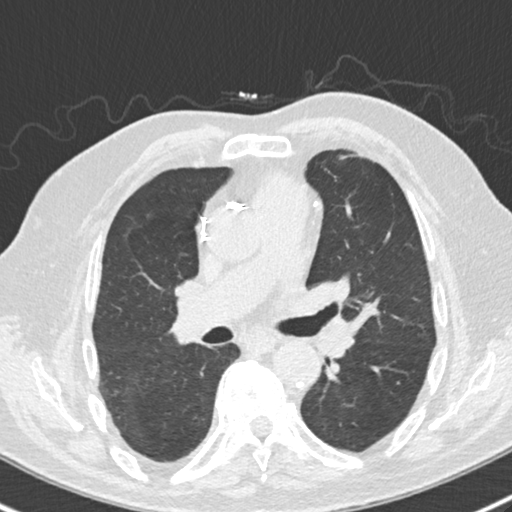
[im 96/152  lung]
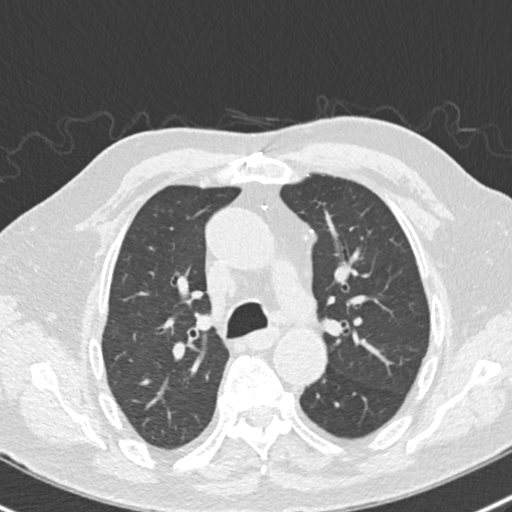
[im 104/152  mediastinal]
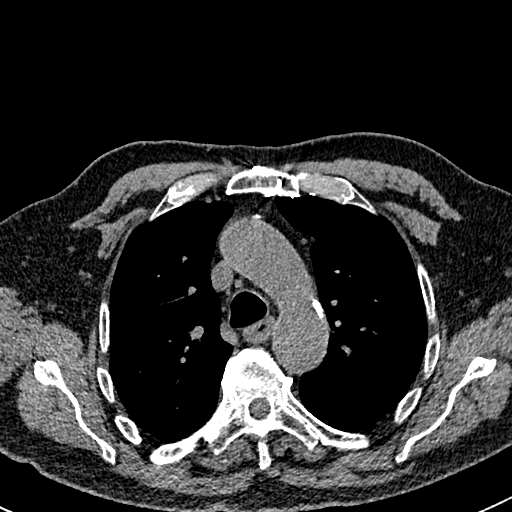
[im 104/152  lung]
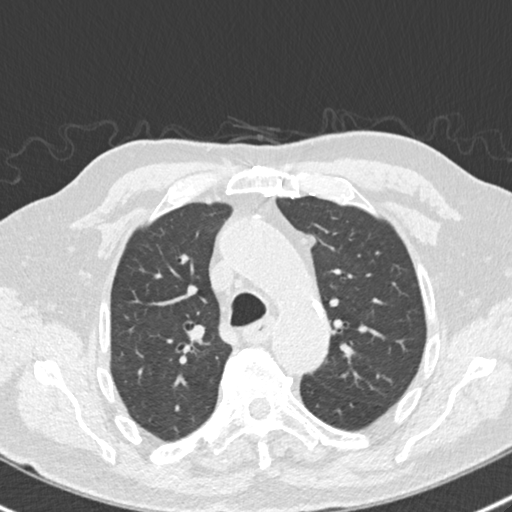
[im 120/152  lung]
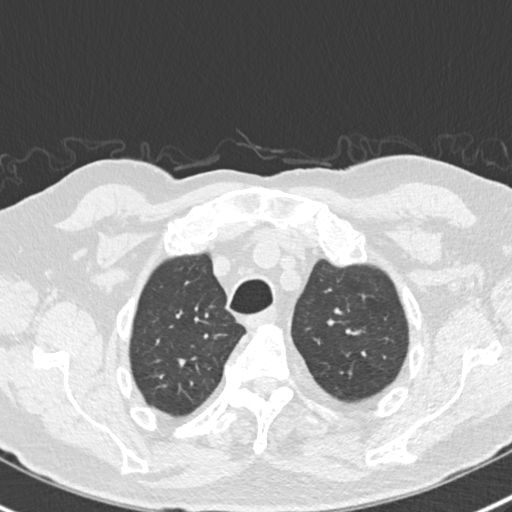
[im 128/152  lung]
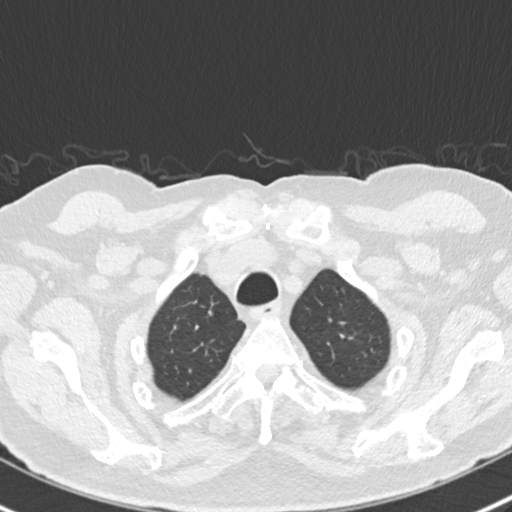
[im 144/152  lung]
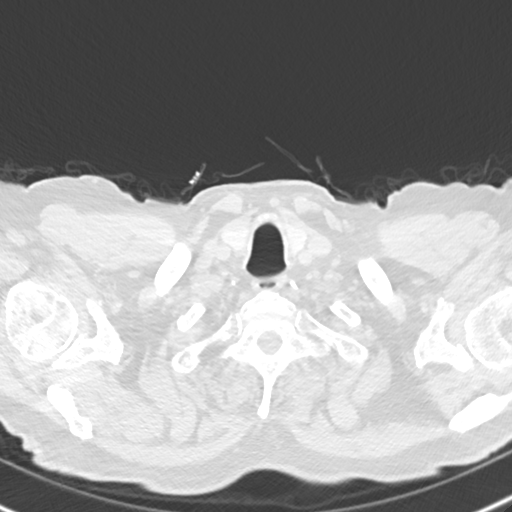

[Series 8: coronal · coronal · 0.59mm/px · 3 of 139 slices shown]
[im 28/139  lung]
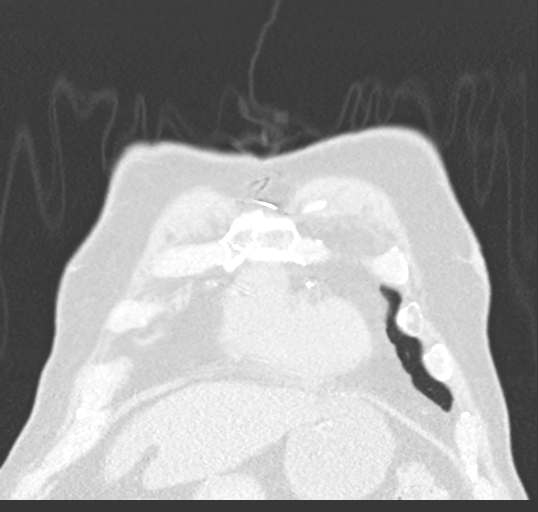
[im 56/139  lung]
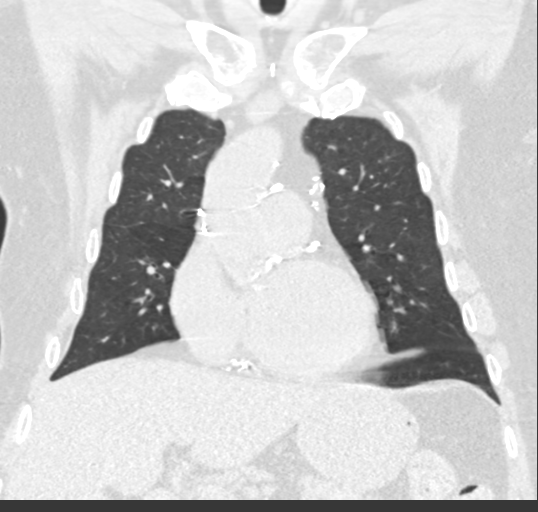
[im 83/139  lung]
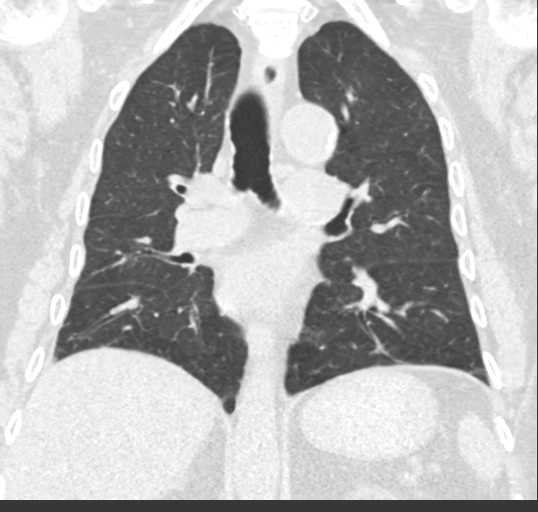

[15 of 36 positions shown; findings below may reference images not displayed]

FINDINGS: Mediastinum/Lymph Nodes: No pathologically enlarged mediastinal or
axillary lymph nodes. Hilar regions are difficult to definitively
evaluate without IV contrast. Heart size normal. No pericardial
effusion.

Lungs/Pleura: Platelike ground-glass and mild bronchiectasis are
again seen in the lower lobes. No definitive subpleural
reticulation, traction bronchiectasis/ bronchiolectasis, additional
ground-glass or architectural distortion. No pleural fluid. Airway
is unremarkable. No air trapping

Upper abdomen: Visualized portions of the liver, adrenal glands,
left kidney, spleen, pancreas, stomach and bowel are grossly
unremarkable.

Musculoskeletal: No worrisome lytic or sclerotic lesions.
Degenerative changes are seen in the spine. Flowing anterior
osteophytosis in the thoracic spine. Question old bilateral
posterior first rib fractures versus artifact.
IMPRESSION: Bilateral lower lobe platelike ground-glass and bronchiectasis are
indicative of scarring. No definitive evidence of interstitial lung
disease.

## 2018-03-17 DIAGNOSIS — J8410 Pulmonary fibrosis, unspecified: Secondary | ICD-10-CM | POA: Diagnosis not present

## 2018-03-17 DIAGNOSIS — R0689 Other abnormalities of breathing: Secondary | ICD-10-CM | POA: Diagnosis not present

## 2018-03-20 DIAGNOSIS — Z951 Presence of aortocoronary bypass graft: Secondary | ICD-10-CM | POA: Diagnosis not present

## 2018-03-20 DIAGNOSIS — I130 Hypertensive heart and chronic kidney disease with heart failure and stage 1 through stage 4 chronic kidney disease, or unspecified chronic kidney disease: Secondary | ICD-10-CM | POA: Diagnosis not present

## 2018-03-20 DIAGNOSIS — Z7982 Long term (current) use of aspirin: Secondary | ICD-10-CM | POA: Diagnosis not present

## 2018-03-20 DIAGNOSIS — K219 Gastro-esophageal reflux disease without esophagitis: Secondary | ICD-10-CM | POA: Diagnosis not present

## 2018-03-20 DIAGNOSIS — I252 Old myocardial infarction: Secondary | ICD-10-CM | POA: Diagnosis not present

## 2018-03-20 DIAGNOSIS — R1310 Dysphagia, unspecified: Secondary | ICD-10-CM | POA: Diagnosis not present

## 2018-03-20 DIAGNOSIS — I5032 Chronic diastolic (congestive) heart failure: Secondary | ICD-10-CM | POA: Diagnosis not present

## 2018-03-20 DIAGNOSIS — M170 Bilateral primary osteoarthritis of knee: Secondary | ICD-10-CM | POA: Diagnosis not present

## 2018-03-20 DIAGNOSIS — K224 Dyskinesia of esophagus: Secondary | ICD-10-CM | POA: Diagnosis not present

## 2018-03-20 DIAGNOSIS — Z79899 Other long term (current) drug therapy: Secondary | ICD-10-CM | POA: Diagnosis not present

## 2018-03-20 DIAGNOSIS — R0602 Shortness of breath: Secondary | ICD-10-CM | POA: Diagnosis not present

## 2018-03-20 DIAGNOSIS — I25118 Atherosclerotic heart disease of native coronary artery with other forms of angina pectoris: Secondary | ICD-10-CM | POA: Diagnosis not present

## 2018-03-20 DIAGNOSIS — Z7902 Long term (current) use of antithrombotics/antiplatelets: Secondary | ICD-10-CM | POA: Diagnosis not present

## 2018-03-20 DIAGNOSIS — E1122 Type 2 diabetes mellitus with diabetic chronic kidney disease: Secondary | ICD-10-CM | POA: Diagnosis not present

## 2018-03-20 DIAGNOSIS — E785 Hyperlipidemia, unspecified: Secondary | ICD-10-CM | POA: Diagnosis not present

## 2018-03-20 DIAGNOSIS — K449 Diaphragmatic hernia without obstruction or gangrene: Secondary | ICD-10-CM | POA: Diagnosis not present

## 2018-03-20 DIAGNOSIS — G4733 Obstructive sleep apnea (adult) (pediatric): Secondary | ICD-10-CM | POA: Diagnosis not present

## 2018-03-20 DIAGNOSIS — N184 Chronic kidney disease, stage 4 (severe): Secondary | ICD-10-CM | POA: Diagnosis not present

## 2018-03-20 DIAGNOSIS — G709 Myoneural disorder, unspecified: Secondary | ICD-10-CM | POA: Diagnosis not present

## 2018-03-21 ENCOUNTER — Other Ambulatory Visit: Payer: Self-pay

## 2018-03-21 ENCOUNTER — Encounter: Payer: Self-pay | Admitting: Student in an Organized Health Care Education/Training Program

## 2018-03-21 ENCOUNTER — Ambulatory Visit
Payer: Medicare Other | Attending: Student in an Organized Health Care Education/Training Program | Admitting: Student in an Organized Health Care Education/Training Program

## 2018-03-21 VITALS — BP 155/82 | HR 63 | Temp 97.7°F | Ht 73.0 in | Wt 198.0 lb

## 2018-03-21 DIAGNOSIS — Z951 Presence of aortocoronary bypass graft: Secondary | ICD-10-CM | POA: Diagnosis not present

## 2018-03-21 DIAGNOSIS — E1122 Type 2 diabetes mellitus with diabetic chronic kidney disease: Secondary | ICD-10-CM

## 2018-03-21 DIAGNOSIS — E1142 Type 2 diabetes mellitus with diabetic polyneuropathy: Secondary | ICD-10-CM

## 2018-03-21 DIAGNOSIS — Z79899 Other long term (current) drug therapy: Secondary | ICD-10-CM | POA: Diagnosis not present

## 2018-03-21 DIAGNOSIS — I130 Hypertensive heart and chronic kidney disease with heart failure and stage 1 through stage 4 chronic kidney disease, or unspecified chronic kidney disease: Secondary | ICD-10-CM | POA: Insufficient documentation

## 2018-03-21 DIAGNOSIS — I5032 Chronic diastolic (congestive) heart failure: Secondary | ICD-10-CM | POA: Insufficient documentation

## 2018-03-21 DIAGNOSIS — Z7982 Long term (current) use of aspirin: Secondary | ICD-10-CM | POA: Diagnosis not present

## 2018-03-21 DIAGNOSIS — G894 Chronic pain syndrome: Secondary | ICD-10-CM

## 2018-03-21 DIAGNOSIS — M059 Rheumatoid arthritis with rheumatoid factor, unspecified: Secondary | ICD-10-CM

## 2018-03-21 DIAGNOSIS — M170 Bilateral primary osteoarthritis of knee: Secondary | ICD-10-CM | POA: Insufficient documentation

## 2018-03-21 DIAGNOSIS — G4733 Obstructive sleep apnea (adult) (pediatric): Secondary | ICD-10-CM | POA: Insufficient documentation

## 2018-03-21 DIAGNOSIS — Z5181 Encounter for therapeutic drug level monitoring: Secondary | ICD-10-CM | POA: Insufficient documentation

## 2018-03-21 DIAGNOSIS — Z79891 Long term (current) use of opiate analgesic: Secondary | ICD-10-CM

## 2018-03-21 DIAGNOSIS — M79605 Pain in left leg: Secondary | ICD-10-CM

## 2018-03-21 DIAGNOSIS — Z9889 Other specified postprocedural states: Secondary | ICD-10-CM | POA: Insufficient documentation

## 2018-03-21 DIAGNOSIS — N184 Chronic kidney disease, stage 4 (severe): Secondary | ICD-10-CM | POA: Diagnosis not present

## 2018-03-21 DIAGNOSIS — M79673 Pain in unspecified foot: Secondary | ICD-10-CM | POA: Diagnosis not present

## 2018-03-21 DIAGNOSIS — N183 Chronic kidney disease, stage 3 (moderate): Secondary | ICD-10-CM

## 2018-03-21 DIAGNOSIS — G629 Polyneuropathy, unspecified: Secondary | ICD-10-CM

## 2018-03-21 DIAGNOSIS — I2510 Atherosclerotic heart disease of native coronary artery without angina pectoris: Secondary | ICD-10-CM | POA: Insufficient documentation

## 2018-03-21 DIAGNOSIS — M79604 Pain in right leg: Secondary | ICD-10-CM | POA: Diagnosis not present

## 2018-03-21 DIAGNOSIS — M792 Neuralgia and neuritis, unspecified: Secondary | ICD-10-CM

## 2018-03-21 DIAGNOSIS — Z9049 Acquired absence of other specified parts of digestive tract: Secondary | ICD-10-CM | POA: Insufficient documentation

## 2018-03-21 DIAGNOSIS — G8929 Other chronic pain: Secondary | ICD-10-CM

## 2018-03-21 MED ORDER — HYDROCODONE-ACETAMINOPHEN 10-325 MG PO TABS: 1.0000 | ORAL_TABLET | ORAL | 0 refills | Status: DC | PRN

## 2018-03-21 NOTE — Progress Notes (Signed)
Patient's Name: [name]  MRN: [id number]  Referring Provider: [name], MD  DOB: 1931-11-29  PCP: [name], MD  DOS: 03/21/2018  Note by: Gillis Santa, MD  Service setting: Ambulatory outpatient  Specialty: Interventional Pain Management  Location: [facility] (AMB) Pain Management Facility    Patient type: Established   Primary Reason(s) for Visit: Encounter for prescription drug management. (Level of risk: moderate)  CC: Foot Pain  HPI  [name] is a 84 y.o. year old, male patient, who comes today for a medication management evaluation. He has Familial multiple lipoprotein-type hyperlipidemia; Obstructive sleep apnea; Essential hypertension; Coronary atherosclerosis; Reflux esophagitis; ESOPHAGEAL STRICTURE; GERD; Diverticulosis of colon; BENIGN PROSTATIC HYPERTROPHY; Actinic keratosis; SLEEP DISORDER, CHRONIC; Nausea; Chronic diastolic heart failure ([name]); ILD (interstitial lung disease) ([place]); CKD (chronic kidney disease) stage 4, GFR 15-29 ml/min ([place]); Routine general medical examination at a health care facility; Seropositive rheumatoid arthritis ([place]); Ventral hernia; DM (diabetes mellitus) type II controlled, neurological manifestation ([name]); Atherosclerotic heart disease of native coronary artery with angina pectoris ([place]); Respiratory failure, chronic ([place]); Spinal stenosis of lumbar region without neurogenic claudication; Spondylosis without myelopathy or radiculopathy, lumbar region; Tegretol-induced dizziness; Diarrhea; Orthostatic hypotension; High risk medication use; Primary osteoarthritis of both knees; History of right knee joint replacement; Dysphagia; Esophageal stricture; Cerumen impaction; Aortic valve disorder; Enlarged prostate without lower urinary tract symptoms (luts); Iron deficiency anemia; Rheumatic fever without heart involvement; Type 2 diabetes mellitus with diabetic polyneuropathy ([place]); Intermediate coronary syndrome ([place]); Thoracic aorta  atherosclerosis ([place]); Constipation; HLD (hyperlipidemia); Rheumatoid arthritis ([place]); Gastro-esophageal reflux disease with esophagitis; Routine history and physical examination of adult; Disturbance in sleep behavior; Chronic pain of both lower extremities; Neuropathic pain; and Chronic pain syndrome on their problem list. His primarily concern today is the Foot Pain  Pain Assessment: Location: Right, Left Foot Radiating: Denies Onset: More than a month ago Duration: Chronic pain Quality: Aching, Burning, Throbbing, Sharp Severity: 5 /10 (subjective, self-reported pain score)  Note: Reported level is compatible with observation.                         When using our objective Pain Scale, levels between 6 and 10/10 are said to belong in an emergency room, as it progressively worsens from a 6/10, described as severely limiting, requiring emergency care not usually available at an outpatient pain management facility. At a 6/10 level, communication becomes difficult and requires great effort. Assistance to reach the emergency department may be required. Facial flushing and profuse sweating along with potentially dangerous increases in heart rate and blood pressure will be evident. Effect on ADL: limits my daily activities Timing: Constant Modifying factors: getting off my foot and medications BP: (!) 155/82  HR: 63  [name] was last scheduled for an appointment on 01/05/2018 for medication management. During today's appointment we reviewed [name] chronic pain status, as well as his outpatient medication regimen.  Patient follows up today for medication management.  Is overall doing well.  States that medications are helping manage his lower extremity neuropathic pain.  Of note patient did have an esophageal dilation yesterday with Botox to his lower esophageal sphincter which he states is helpful in his dysphagia symptoms.  Otherwise no changes in his medical history.  We will refill  medications as below.  The patient  reports no history of drug use. His body mass index is 26.12 kg/m.  Further details on both, my assessment(s), as well as the proposed  treatment plan, please see below.  Controlled Substance Pharmacotherapy Assessment REMS (Risk Evaluation and Mitigation Strategy)  Analgesic: Hydrocodone 10 to 20 mg 3 times daily as needed, quantity 180/month MME/day: 60 mg/day.  Chauncey Fischer, RN  03/21/2018 12:31 PM  Sign when Signing Visit Nursing Pain Medication Assessment:  Safety precautions to be maintained throughout the outpatient stay will include: orient to surroundings, keep bed in low position, maintain call bell within reach at all times, provide assistance with transfer out of bed and ambulation.  Medication Inspection Compliance: Pill count conducted under aseptic conditions, in front of the patient. Neither the pills nor the bottle was removed from the patient's sight at any time. Once count was completed pills were immediately returned to the patient in their original bottle.  Medication: Hydrocodone/APAP Pill/Patch Count: 161 of 180 pills remain Pill/Patch Appearance: Markings consistent with prescribed medication Bottle Appearance: Standard pharmacy container. Clearly labeled. Filled Date: 12/2/ 2019 Last Medication intake:  Today Pharmacokinetics: Liberation and absorption (onset of action): WNL Distribution (time to peak effect): WNL Metabolism and excretion (duration of action): WNL         Pharmacodynamics: Desired effects: Analgesia: [name] reports >50% benefit. Functional ability: Patient reports that medication allows him to accomplish basic ADLs Clinically meaningful improvement in function (CMIF): Sustained CMIF goals met Perceived effectiveness: Described as relatively effective, allowing for increase in activities of daily living (ADL) Undesirable effects: Side-effects or Adverse reactions: None reported Monitoring: [place] PMP:  Online review of the past 68-monthperiod conducted. Compliant with practice rules and regulations Last UDS on record: Summary  Date Value Ref Range Status  10/26/2017 FINAL  Final    Comment:    ==================================================================== TOXASSURE COMP DRUG ANALYSIS,UR ==================================================================== Test                             Result       Flag       Units Drug Present and Declared for Prescription Verification   Hydrocodone                    2548         EXPECTED   ng/mg creat   Hydromorphone                  124          EXPECTED   ng/mg creat   Dihydrocodeine                 251          EXPECTED   ng/mg creat   Norhydrocodone                 1816         EXPECTED   ng/mg creat    Sources of hydrocodone include scheduled prescription    medications. Hydromorphone, dihydrocodeine and norhydrocodone are    expected metabolites of hydrocodone. Hydromorphone and    dihydrocodeine are also available as scheduled prescription    medications.   Acetaminophen                  PRESENT      EXPECTED Drug Absent but Declared for Prescription Verification   Salicylate                     Not Detected UNEXPECTED    Aspirin, as indicated in the declared medication list, is not  always detected even when used as directed. ==================================================================== Test                      Result    Flag   Units      Ref Range   Creatinine              87               mg/dL      >=20 ==================================================================== Declared Medications:  The flagging and interpretation on this report are based on the  following declared medications.  Unexpected results may arise from  inaccuracies in the declared medications.  **Note: The testing scope of this panel includes these medications:  Hydrocodone (Norco)  **Note: The testing scope of this panel does not include  small to  moderate amounts of these reported medications:  Acetaminophen (Norco)  Aspirin (Aspirin 81)  **Note: The testing scope of this panel does not include following  reported medications:  Carvedilol (Coreg)  Clopidogrel (Plavix)  Furosemide (Lasix)  Iron (Ferrous Sulfate)  Lubiprostone (Amitiza)  Nitroglycerin (Nitrostat)  Ondansetron (Zofran)  Oxygen  Pantoprazole (Protonix)  Vitamin C  Vitamin D ==================================================================== For clinical consultation, please call [phone]. ====================================================================    UDS interpretation: Compliant          Medication Assessment Form: Reviewed. Patient indicates being compliant with therapy Treatment compliance: Compliant Risk Assessment Profile: Aberrant behavior: See prior evaluations. None observed or detected today Comorbid factors increasing risk of overdose: See prior notes. No additional risks detected today Opioid risk tool (ORT) (Total Score): 0 Personal History of Substance Abuse (SUD-Substance use disorder):  Alcohol: Negative  Illegal Drugs: Negative  Rx Drugs: Negative  ORT Risk Level calculation: Low Risk Risk of substance use disorder (SUD): Low Opioid Risk Tool - 03/21/18 1228      Family History of Substance Abuse   Alcohol  Negative    Illegal Drugs  Negative    Rx Drugs  Negative      Personal History of Substance Abuse   Alcohol  Negative    Illegal Drugs  Negative    Rx Drugs  Negative      Age   Age between 22-45 years   No      History of Preadolescent Sexual Abuse   History of Preadolescent Sexual Abuse  Negative or Male      Psychological Disease   Psychological Disease  Negative    Depression  Negative      Total Score   Opioid Risk Tool Scoring  0    Opioid Risk Interpretation  Low Risk      ORT Scoring interpretation table:  Score <3 = Low Risk for SUD  Score between 4-7 = Moderate Risk for SUD   Score >8 = High Risk for Opioid Abuse   Risk Mitigation Strategies:  Patient Counseling: Covered Patient-Prescriber Agreement (PPA): Present and active  Notification to other healthcare providers: Done  Pharmacologic Plan: No change in therapy, at this time.             Laboratory Chemistry  Inflammation Markers (CRP: Acute Phase) (ESR: Chronic Phase) Lab Results  Component Value Date   ESRSEDRATE 38 (H) 11/22/2011                         Rheumatology Markers Lab Results  Component Value Date   RF 23 (H) 11/22/2011   ANA NEG 11/22/2011  Renal Function Markers Lab Results  Component Value Date   BUN 29 (H) 10/17/2017   CREATININE 2.47 (H) 10/17/2017   BCR 13 06/24/2017   GFRAA 30 (L) 06/24/2017   GFRNONAA 26 (L) 06/24/2017                             Hepatic Function Markers Lab Results  Component Value Date   AST 17 06/24/2017   ALT 10 06/24/2017   ALBUMIN 4.1 10/17/2017   ALKPHOS 67 04/29/2017                        Electrolytes Lab Results  Component Value Date   NA 138 10/17/2017   K 4.7 10/17/2017   CL 97 10/17/2017   CALCIUM 9.8 10/17/2017   MG 1.9 04/08/2017   PHOS 3.5 10/17/2017                        Neuropathy Markers Lab Results  Component Value Date   VITAMINB12 253 08/10/2016   FOLATE 17.6 08/10/2016   HGBA1C 8.1 (H) 10/17/2017                        CNS Tests No results found for: COLORCSF, APPEARCSF, RBCCOUNTCSF, WBCCSF, POLYSCSF, LYMPHSCSF, EOSCSF, PROTEINCSF, GLUCCSF, JCVIRUS, CSFOLI, IGGCSF                      Bone Pathology Markers No results found for: VD25OH, VD125OH2TOT, [id number], [id number], 25OHVITD1, 25OHVITD2, 25OHVITD3, TESTOFREE, TESTOSTERONE                       Coagulation Parameters Lab Results  Component Value Date   INR 1.12 08/13/2015   LABPROT 14.6 08/13/2015   APTT 29 08/13/2015   PLT 201 06/24/2017                        Cardiovascular Markers Lab Results  Component Value Date    BNP 318.4 (H) 08/13/2015   CKTOTAL 73 12/31/2011   CKMB 3.4 12/31/2011   TROPONINI 0.68 (HH) 08/14/2015   HGB 12.0 (L) 06/24/2017   HCT 38.2 (L) 06/24/2017                         CA Markers No results found for: CEA, CA125, LABCA2                      Note: Lab results reviewed.  Recent Diagnostic Imaging Results  ECHOCARDIOGRAM COMPLETE                         [name]                        [address], [place] [address]                            [phone]  ------------------------------------------------------------------- Transthoracic Echocardiography  Patient:    [name], [name] MR #:       [id number] Study Date: 01/26/2018 Gender:  M Age:        62 Height:     185.4 cm Weight:     86.2 kg BSA:        2.11 m^2 Pt. Status: Room:   Jetty Duhamel, M.D.  REFERRING    [name], M.D.  SONOGRAPHER  Diamond Nickel  PERFORMING   [facility], Outpatient  ATTENDING    Cherlynn Kaiser  cc:  ------------------------------------------------------------------- LV EF: 50% -   55%  ------------------------------------------------------------------- Indications:      I35.0 Aortic stenosis.  ------------------------------------------------------------------- History:   PMH:  Palpitations.  Dyspnea.  Coronary artery disease. Risk factors:  Hypertension. Diabetes mellitus. Dyslipidemia.  ------------------------------------------------------------------- Study Conclusions  - Left ventricle: The cavity size was normal. Wall thickness was   increased in a pattern of moderate LVH. Systolic function was   normal. The estimated ejection fraction was in the range of 50%   to 55%. There is akinesis of the inferior myocardium. Doppler   parameters are consistent with abnormal left ventricular   relaxation (grade 1 diastolic dysfunction). - Aortic valve: Valve mobility was restricted. There was mild   stenosis.  There was trivial regurgitation. - Mitral valve: Calcified annulus. 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 - Pericardium, extracardiac: A trivial pericardial effusion was   identified.  Impressions:  - Akinesis of the basal inferior wall with overall low normal LV   function; mild diastolic dysfunction; moderate LVH; calcified   aortic valve with probable fusion of left and noncoronary cusps;   mild AS (mean gradient 12 mmHg) and trace AI.  ------------------------------------------------------------------- Labs, prior tests, procedures, and surgery: Coronary artery bypass grafting.  ------------------------------------------------------------------- Study data:  Comparison was made to the study of 01/17/2017.  Study status:  Routine.  Procedure:  The patient reported no pain pre or post test. Transthoracic echocardiography. Image quality was adequate.  Study completion:  There were no complications. Transthoracic echocardiography.  M-mode, complete 2D, spectral Doppler, and color Doppler.  Birthdate:  Patient birthdate: 1931-06-21.  Age:  Patient is 84 yr old.  Sex:  Gender: male. BMI: 25.1 kg/m^2.  Blood pressure:     156/83  Patient status: Outpatient.  Study date:  Study date: 01/26/2018. Study time: 02:10 PM.  Location:  [facility]  -------------------------------------------------------------------  ------------------------------------------------------------------- Left ventricle:  The cavity size was normal. Wall thickness was increased in a pattern of moderate LVH. Systolic function was normal. The estimated ejection fraction was in the range of 50% to 55%.  Regional wall motion abnormalities:   There is akinesis of the inferior myocardium. Doppler parameters are consistent with abnormal left ventricular relaxation (grade 1 diastolic dysfunction).  ------------------------------------------------------------------- Aortic valve:   Trileaflet.  Valve mobility was restricted. Doppler:   There was mild stenosis.   There was trivial regurgitation.    VTI ratio of LVOT to aortic valve: 0.36. Indexed valve area (VTI): 0.54 cm^2/m^2. Peak velocity ratio of LVOT to aortic valve: 0.35. Indexed valve area (Vmax): 0.52 cm^2/m^2. Mean velocity ratio of LVOT to aortic valve: 0.3. Indexed valve area (Vmean): 0.45 cm^2/m^2.    Mean gradient (S): 12 mm Hg. Peak gradient (S): 22 mm Hg.  ------------------------------------------------------------------- Aorta:  Aortic root: The aortic root was normal in size.  ------------------------------------------------------------------- Mitral valve:   Calcified annulus. Mobility was not restricted. Doppler:  Transvalvular velocity was within the normal range. There was no evidence for stenosis. There was trivial regurgitation. Indexed valve area by pressure half-time: 1.04 cm^2/m^2.  -------------------------------------------------------------------  Left atrium:  The atrium was normal in size.  ------------------------------------------------------------------- Atrial septum:  There was an atrial septal aneurysm.  ------------------------------------------------------------------- Right ventricle:  The cavity size was normal. Systolic function was normal.  ------------------------------------------------------------------- Pulmonic valve:    Doppler:  Transvalvular velocity was within the normal range. There was no evidence for stenosis.  ------------------------------------------------------------------- Tricuspid valve:   Structurally normal valve.    Doppler: Transvalvular velocity was within the normal range. There was trivial regurgitation.  ------------------------------------------------------------------- Pulmonary artery:   Systolic pressure was within the normal range.   ------------------------------------------------------------------- Right atrium:  The atrium was normal in  size.  ------------------------------------------------------------------- Pericardium:  A trivial pericardial effusion was identified.  ------------------------------------------------------------------- Systemic veins: Inferior vena cava: The vessel was normal in size.  ------------------------------------------------------------------- Measurements   Left ventricle                           Value          Reference  LV ID, ED, PLAX chordal          (L)     41    mm       43 - 52  LV ID, ES, PLAX chordal                  34    mm       23 - 38  LV fx shortening, PLAX chordal   (L)     17    %        >=29  LV PW thickness, ED                      15    mm       ----------  IVS/LV PW ratio, ED                      0.93           <=1.3  Stroke volume, 2D                        55    ml       ----------  Stroke volume/bsa, 2D                    26    ml/m^2   ----------  LV e&', lateral                           4.9   cm/s     ----------  LV E/e&', lateral                         10.49          ----------  LV e&', medial                            4.9   cm/s     ----------  LV E/e&', medial                          10.49          ----------  LV e&', average  4.9   cm/s     ----------  LV E/e&', average                         10.49          ----------    Ventricular septum                       Value          Reference  IVS thickness, ED                        13.96 mm       ----------    LVOT                                     Value          Reference  LVOT ID, S                               20    mm       ----------  LVOT area                                3.14  cm^2     ----------  LVOT peak velocity, S                    81.6  cm/s     ----------  LVOT mean velocity, S                    49    cm/s     ----------  LVOT VTI, S                              17.4  cm       ----------    Aortic valve                             Value          Reference   Aortic valve peak velocity, S            234   cm/s     ----------  Aortic valve mean velocity, S            162   cm/s     ----------  Aortic valve VTI, S                      47.8  cm       ----------  Aortic mean gradient, S                  12    mm Hg    ----------  Aortic peak gradient, S                  22    mm Hg    ----------  VTI ratio, LVOT/AV                       0.36           ----------  Aortic valve area/bsa, VTI               0.54  cm^2/m^2 ----------  Velocity ratio, peak, LVOT/AV            0.35           ----------  Aortic valve area/bsa, peak              0.52  cm^2/m^2 ----------  velocity  Velocity ratio, mean, LVOT/AV            0.3            ----------  Aortic valve area/bsa, mean              0.45  cm^2/m^2 ----------  velocity  Aortic regurg peak velocity              211   cm/s     ----------  Aortic regurg pressure half-time         668   ms       ----------  Aortic regurg peak gradient              18    mm Hg    ----------    Aorta                                    Value          Reference  Aortic root ID, ED                       28    mm       ----------    Left atrium                              Value          Reference  LA ID, A-P, ES                           38    mm       ----------  LA ID/bsa, A-P                           1.8   cm/m^2   <=2.2  LA volume, S                             53.2  ml       ----------  LA volume/bsa, S                         25.2  ml/m^2   ----------  LA volume, ES, 1-p A4C                   43.1  ml       ----------  LA volume/bsa, ES, 1-p A4C               20.4  ml/m^2   ----------  LA volume, ES, 1-p A2C                   64.8  ml       ----------  LA volume/bsa, ES, 1-p A2C  30.7  ml/m^2   ----------    Mitral valve                             Value          Reference  Mitral E-wave peak velocity              51.4  cm/s     ----------  Mitral A-wave peak velocity              101   cm/s      ----------  Mitral deceleration time         (H)     342   ms       150 - 230  Mitral E/A ratio, peak                   0.5            ----------  Mitral valve area/bsa, PHT, DP           1.04  cm^2/m^2 ----------    Pulmonary arteries                       Value          Reference  PA pressure, S, DP                       28    mm Hg    <=30    Tricuspid valve                          Value          Reference  Tricuspid regurg peak velocity           251   cm/s     ----------  Tricuspid peak RV-RA gradient            25    mm Hg    ----------    Right atrium                             Value          Reference  RA ID, S-I, ES, A4C              (H)     52    mm       34 - 49  RA area, ES, A4C                         13.7  cm^2     8.3 - 19.5  RA volume, ES, A/L                       27.4  ml       ----------  RA volume/bsa, ES, A/L                   13    ml/m^2   ----------    Systemic veins                           Value          Reference  Estimated CVP  3     mm Hg    ----------    Right ventricle                          Value          Reference  RV pressure, S, DP                       28    mm Hg    <=30  RV s&', lateral, S                        8.7   cm/s     ----------  Legend: (L)  and  (H)  mark values outside specified reference range.  ------------------------------------------------------------------- Prepared and Electronically Authenticated by  Kirk Ruths 2019-10-24T16:51:27  Complexity Note: Imaging results reviewed. Results shared with [name], using Layman's terms.                         Meds   Current Outpatient Medications:  .  AMITIZA 24 MCG capsule, Take 2 capsules by mouth daily., Disp: , Rfl:  .  aspirin EC 81 MG tablet, Take 81 mg by mouth at bedtime., Disp: , Rfl:  .  Blood Glucose Monitoring Suppl (ONE TOUCH ULTRA 2) w/Device KIT, Use to obtain blood sugar daily. Dx Code E11.40, Disp: 1 each, Rfl: 0 .   carvedilol (COREG) 3.125 MG tablet, TAKE 1 TABLET BY MOUTH TWICE (2) DAILY WITH MEALS, Disp: 180 tablet, Rfl: 3 .  cholecalciferol (VITAMIN D) 1000 units tablet, Take 2,000 Units by mouth daily. , Disp: , Rfl:  .  clopidogrel (PLAVIX) 75 MG tablet, TAKE 1 TABLET BY MOUTH ONCE DAILY WITH BREAKFAST, Disp: 90 tablet, Rfl: 1 .  DHA-Vitamin C-Lutein (EYE HEALTH FORMULA PO), Take 1 tablet by mouth daily. , Disp: , Rfl:  .  ferrous sulfate 324 (65 Fe) MG TBEC, Take 1 tablet by mouth daily., Disp: , Rfl:  .  furosemide (LASIX) 40 MG tablet, TAKE 1 TO 2 TABLETS BY MOUTH EVERY DAY, Disp: 60 tablet, Rfl: 11 .  glucose blood (ONE TOUCH ULTRA TEST) test strip, USE TO CHECK BLOOD SUGAR ONCE A DAY Dx Code E11.40, Disp: 100 each, Rfl: 3 .  HYDROcodone-acetaminophen (NORCO) 10-325 MG tablet, Take 1-2 tablets by mouth every 4 (four) hours as needed for severe pain. To fill on or after: 04/05/2018, 05/04/2018, 06/01/2018, Disp: 180 tablet, Rfl: 0 .  linaclotide (LINZESS) 145 MCG CAPS capsule, Take 145 mcg by mouth daily before breakfast., Disp: , Rfl:  .  nitroGLYCERIN (NITROSTAT) 0.4 MG SL tablet, DISSOLVE 1 TABLET UNDER TONGUE AS NEEDEDFOR CHEST PAIN. MAY REPEAT 5 MINUTES APART 3 TIMES IF NEEDED. IF NO RELIEF CALL 911, Disp: 25 tablet, Rfl: 1 .  ondansetron (ZOFRAN) 4 MG tablet, TAKE 1 TABLET BY MOUTH EVERY 8 HOURS AS NEEDED FOR NAUSEA OR VOMITING, Disp: 60 tablet, Rfl: 0 .  ONETOUCH DELICA LANCETS 54U MISC, 1 each by In Vitro route daily. Dx Code E11.49, Disp: 100 each, Rfl: 3 .  OXYGEN, Inhale into the lungs. Per pt- Uses Oxygen 2 liters at night., Disp: , Rfl:  .  pantoprazole (PROTONIX) 40 MG tablet, TAKE 1 TABLET BY MOUTH TWICE (2) DAILY, Disp: 60 tablet, Rfl: 11  ROS  Constitutional: Denies any fever or chills Gastrointestinal: No reported hemesis, hematochezia, vomiting, or acute GI distress  Musculoskeletal: Denies any acute onset joint swelling, redness, loss of ROM, or weakness Neurological: No reported  episodes of acute onset apraxia, aphasia, dysarthria, agnosia, amnesia, paralysis, loss of coordination, or loss of consciousness  Allergies  [name] is allergic to doxazosin mesylate and methocarbamol.  [place]  Drug: [name]  reports no history of drug use. Alcohol:  reports no history of alcohol use. Tobacco:  reports that he quit smoking about 54 years ago. His smoking use included cigarettes. He has a 20.00 pack-year smoking history. He has never used smokeless tobacco. Medical:  has a past medical history of Arthritis, CAD (coronary artery disease), Chronic diastolic CHF (congestive heart failure) ([place]), Chronic kidney disease, stage III (moderate) ([place]), Chronic lower back pain, Colon polyps, Diverticulosis, Dyspnea (2009 since July -Sept), Enlarged prostate, Esophageal stricture, GERD (gastroesophageal reflux disease), Heart murmur, Hiatal hernia, History of kidney stones, History of PFTs, Hyperlipidemia, Hypertension, Interstitial lung disease ([place]), Nausea & vomiting, Overweight (BMI 25.0-29.9), RA (rheumatoid arthritis) ([place]), Seropositive rheumatoid arthritis ([place]), and Type II diabetes mellitus ([place]). Surgical: [name]  has a past surgical history that includes Total knee arthroplasty (Right, 03/2010); Knee arthroscopy (Right, 2008); Cataract extraction w/ intraocular lens  implant, bilateral (Bilateral); Coronary artery bypass graft (11/1999); Coronary stent placement (02/2012); Shoulder arthroscopy with open rotator cuff repair and distal clavicle acrominectomy (Left, 02/27/2013); Trigger finger release (Left, 02/27/2013); Cholecystectomy open (11/2003); Joint replacement; Esophagogastroduodenoscopy (egd) with esophageal dilation (2010); Cardiac catheterization (08/2004); Cardiac catheterization (12/31/2011); Coronary angioplasty with stent (01/03/2012); Coronary angioplasty with stent (01/14/2014); Cardiac catheterization (2009); left and right heart catheterization with coronary  angiogram (12/31/2011); percutaneous coronary stent intervention (pci-s) (12/31/2011); percutaneous coronary stent intervention (pci-s) (N/A, 01/03/2012); Percutaneous coronary intervention-balloon only (01/03/2012); left and right heart catheterization with coronary angiogram (N/A, 01/14/2014); Hand surgery; Cardiac catheterization (N/A, 08/13/2015); Esophagogastroduodenoscopy (N/A, 03/01/2017); and maloney dilation (03/01/2017). Family: family history includes Alcohol abuse in his sister; COPD in his mother; Colon cancer (age of onset: 23) in his brother; Diabetes in his brother; Heart attack in his father; Heart disease in his father; Stroke in his sister.  Constitutional Exam  General appearance: Well nourished, well developed, and well hydrated. In no apparent acute distress Vitals:   03/21/18 1215  BP: (!) 155/82  Pulse: 63  Temp: 97.7 F (36.5 C)  SpO2: 96%  Weight: 198 lb (89.8 kg)  Height: _0  (1.854 m)   BMI Assessment: Estimated body mass index is 26.12 kg/m as calculated from the following:   Height as of this encounter: _1  (1.854 m).   Weight as of this encounter: 198 lb (89.8 kg).  BMI interpretation table: BMI level Category Range association with higher incidence of chronic pain  <18 kg/m2 Underweight   18.5-24.9 kg/m2 Ideal body weight   25-29.9 kg/m2 Overweight Increased incidence by 20%  30-34.9 kg/m2 Obese (Class I) Increased incidence by 68%  35-39.9 kg/m2 Severe obesity (Class II) Increased incidence by 136%  >40 kg/m2 Extreme obesity (Class III) Increased incidence by 254%   Patient's current BMI Ideal Body weight  Body mass index is 26.12 kg/m. Ideal body weight: 79.9 kg (176 lb 2.4 oz) Adjusted ideal body weight: 83.9 kg (184 lb 14.2 oz)   BMI Readings from Last 4 Encounters:  03/21/18 26.12 kg/m  01/19/18 25.08 kg/m  01/05/18 27.05 kg/m  12/22/17 26.12 kg/m   Wt Readings from Last 4 Encounters:  03/21/18 198 lb (89.8 kg)  01/19/18 190 lb 1.9 oz  (86.2 kg)  01/05/18 205 lb (93 kg)  12/22/17 198 lb (89.8 kg)  Psych/Mental status: Alert, oriented x 3 (person, place, & time)       Eyes: PERLA Respiratory: No evidence of acute respiratory distress  Cervical Spine Area Exam  Skin & Axial Inspection: No masses, redness, edema, swelling, or associated skin lesions Alignment: Symmetrical Functional ROM: Unrestricted ROM      Stability: No instability detected Muscle Tone/Strength: Functionally intact. No obvious neuro-muscular anomalies detected. Sensory (Neurological): Unimpaired Palpation: No palpable anomalies              Upper Extremity (UE) Exam    Side: Right upper extremity  Side: Left upper extremity  Skin & Extremity Inspection: Skin color, temperature, and hair growth are WNL. No peripheral edema or cyanosis. No masses, redness, swelling, asymmetry, or associated skin lesions. No contractures.  Skin & Extremity Inspection: Skin color, temperature, and hair growth are WNL. No peripheral edema or cyanosis. No masses, redness, swelling, asymmetry, or associated skin lesions. No contractures.  Functional ROM: Unrestricted ROM          Functional ROM: Unrestricted ROM          Muscle Tone/Strength: Functionally intact. No obvious neuro-muscular anomalies detected.  Muscle Tone/Strength: Functionally intact. No obvious neuro-muscular anomalies detected.  Sensory (Neurological): Unimpaired          Sensory (Neurological): Unimpaired          Palpation: No palpable anomalies              Palpation: No palpable anomalies              Provocative Test(s):  Phalen's test: deferred Tinel's test: deferred Apley's scratch test (touch opposite shoulder):  Action 1 (Across chest): deferred Action 2 (Overhead): deferred Action 3 (LB reach): deferred   Provocative Test(s):  Phalen's test: deferred Tinel's test: deferred Apley's scratch test (touch opposite shoulder):  Action 1 (Across chest): deferred Action 2 (Overhead): deferred Action  3 (LB reach): deferred    Thoracic Spine Area Exam  Skin & Axial Inspection: No masses, redness, or swelling Alignment: Symmetrical Functional ROM: Unrestricted ROM Stability: No instability detected Muscle Tone/Strength: Functionally intact. No obvious neuro-muscular anomalies detected. Sensory (Neurological): Unimpaired Muscle strength & Tone: No palpable anomalies  Lumbar Spine Area Exam  Skin & Axial Inspection: No masses, redness, or swelling Alignment: Symmetrical Functional ROM: Unrestricted ROM       Stability: No instability detected Muscle Tone/Strength: Functionally intact. No obvious neuro-muscular anomalies detected. Sensory (Neurological): Unimpaired Palpation: No palpable anomalies       Provocative Tests: Hyperextension/rotation test: deferred today       Lumbar quadrant test (Kemp's test): deferred today       Lateral bending test: deferred today       Patrick's Maneuver: deferred today                   FABER* test: deferred today                   S-I anterior distraction/compression test: deferred today         S-I lateral compression test: deferred today         S-I Thigh-thrust test: deferred today         S-I Gaenslen's test: deferred today         *(Flexion, ABduction and External Rotation)  Gait & Posture Assessment  Ambulation: Unassisted Gait: Relatively normal for age and body habitus Posture: WNL   Lower Extremity Exam  Side: Right lower extremity  Side: Left lower extremity  Stability: No instability observed          Stability: No instability observed          Skin & Extremity Inspection: Skin color, temperature, and hair growth are WNL. No peripheral edema or cyanosis. No masses, redness, swelling, asymmetry, or associated skin lesions. No contractures.  Skin & Extremity Inspection: Skin color, temperature, and hair growth are WNL. No peripheral edema or cyanosis. No masses, redness, swelling, asymmetry, or associated skin lesions. No  contractures.  Functional ROM: Unrestricted ROM                  Functional ROM: Unrestricted ROM                  Muscle Tone/Strength: Functionally intact. No obvious neuro-muscular anomalies detected.  Muscle Tone/Strength: Functionally intact. No obvious neuro-muscular anomalies detected.  Sensory (Neurological): Neuropathic pain pattern        Sensory (Neurological): Neuropathic pain pattern        DTR: Patellar: deferred today Achilles: deferred today Plantar: deferred today  DTR: Patellar: deferred today Achilles: deferred today Plantar: deferred today  Palpation: No palpable anomalies  Palpation: No palpable anomalies   Assessment  Primary Diagnosis & Pertinent Problem List: The primary encounter diagnosis was Chronic pain of both lower extremities. Diagnoses of Neuropathic pain, Chronic pain syndrome, Long term prescription opiate use, Diabetic polyneuropathy associated with type 2 diabetes mellitus ([place]), Neuropathy, Seropositive rheumatoid arthritis ([place]), and CKD stage 3 due to type 2 diabetes mellitus ([place]) were also pertinent to this visit.  Status Diagnosis  Controlled Controlled Controlled 1. Chronic pain of both lower extremities   2. Neuropathic pain   3. Chronic pain syndrome   4. Long term prescription opiate use   5. Diabetic polyneuropathy associated with type 2 diabetes mellitus ([place])   6. Neuropathy   7. Seropositive rheumatoid arthritis ([place])   8. CKD stage 3 due to type 2 diabetes mellitus ([place])      General Recommendations: The pain condition that the patient suffers from is best treated with a multidisciplinary approach that involves an increase in physical activity to prevent de-conditioning and worsening of the pain cycle, as well as psychological counseling (formal and/or informal) to address the co-morbid psychological affects of pain. Treatment will often involve judicious use of pain medications and interventional procedures to decrease the pain,  allowing the patient to participate in the physical activity that will ultimately produce long-lasting pain reductions. The goal of the multidisciplinary approach is to return the patient to a higher level of overall function and to restore their ability to perform activities of daily living.  84 year old male with a history of coronary artery disease, GERD, interstitial lung disease, rheumatoid arthritis, aortic stenosis, chronic kidney disease stage III who presents with lower extremity neuropathic pain secondary to diabetic polyneuropathy. Patient's diagnosis of diabetes was over 10-15 years ago. He is not on insulin. He describes burning and tingling sensation in his toes. This is worsened over time. This is progressed to the dorsum of his foot. Patient has been tried on various medications including desipramine, lidocaine cream, gabapentin at a dose of 900 mill grams in the morning, 600 mg in the afternoon, 600 mg at bedtime. This dose had to be reduced secondary to his chronic kidney disease. Patient was also tried on Lyrica 100 mgtwice daily but it was too expensive for the patient to afford. Furthermore Lyrica resulted in leg swelling  and further dose escalation was limited by patient's chronic kidney disease stage III. It was not very effective for his neuropathic pain symptoms. Patient also has tried carbamazepine which was not effective for his neuropathic pain. Patient has also had injections in his foot for his pain symptoms which were not effective.   Today, we will refill the patient's Hydrocodone as below. No dose change. UDS up-to-date and appropriate.  [place] PMP checked and appropriate.  Plan of Care  Pharmacotherapy (Medications Ordered): Meds ordered this encounter  Medications  . DISCONTD: HYDROcodone-acetaminophen (NORCO) 10-325 MG tablet    Sig: Take 1-2 tablets by mouth every 4 (four) hours as needed for severe pain. To fill on or after: 04/05/2018, 05/04/2018,  06/01/2018    Dispense:  180 tablet    Refill:  0    Do not place this medication, or any other prescription from our practice, on "Automatic Refill". Patient may have prescription filled one day early if pharmacy is closed on scheduled refill date.  [name] DISCONTD: HYDROcodone-acetaminophen (NORCO) 10-325 MG tablet    Sig: Take 1-2 tablets by mouth every 4 (four) hours as needed for severe pain. To fill on or after: 04/05/2018, 05/04/2018, 06/01/2018    Dispense:  180 tablet    Refill:  0    Do not place this medication, or any other prescription from our practice, on "Automatic Refill". Patient may have prescription filled one day early if pharmacy is closed on scheduled refill date.  [name] HYDROcodone-acetaminophen (NORCO) 10-325 MG tablet    Sig: Take 1-2 tablets by mouth every 4 (four) hours as needed for severe pain. To fill on or after: 04/05/2018, 05/04/2018, 06/01/2018    Dispense:  180 tablet    Refill:  0    Do not place this medication, or any other prescription from our practice, on "Automatic Refill". Patient may have prescription filled one day early if pharmacy is closed on scheduled refill date.   Provider-requested follow-up: Return in about 3 months (around 06/20/2018) for Medication Management.  Future Appointments  Date Time Provider [place]  04/28/2018 11:30 AM [name], MD [facility]  06/15/2018  1:45 PM Gillis Santa, MD [facility] None    Primary Care Physician: [name], MD Location: [facility] Note by: Gillis Santa, M.D Date: 03/21/2018; Time: 2:39 PM  Patient Instructions  You have been handed 3 scripts for Hydrocodone today to last until 07-01-18

## 2018-03-21 NOTE — Progress Notes (Signed)
Nursing Pain Medication Assessment:  Safety precautions to be maintained throughout the outpatient stay will include: orient to surroundings, keep bed in low position, maintain call bell within reach at all times, provide assistance with transfer out of bed and ambulation.  Medication Inspection Compliance: Pill count conducted under aseptic conditions, in front of the patient. Neither the pills nor the bottle was removed from the patient's sight at any time. Once count was completed pills were immediately returned to the patient in their original bottle.  Medication: Hydrocodone/APAP Pill/Patch Count: 161 of 180 pills remain Pill/Patch Appearance: Markings consistent with prescribed medication Bottle Appearance: Standard pharmacy container. Clearly labeled. Filled Date: 12/2/ 2019 Last Medication intake:  Today

## 2018-03-21 NOTE — Patient Instructions (Addendum)
You have been handed 3 scripts for Hydrocodone today to last until 07-01-18

## 2018-04-06 DIAGNOSIS — H35372 Puckering of macula, left eye: Secondary | ICD-10-CM | POA: Diagnosis not present

## 2018-04-06 DIAGNOSIS — H353213 Exudative age-related macular degeneration, right eye, with inactive scar: Secondary | ICD-10-CM | POA: Diagnosis not present

## 2018-04-06 DIAGNOSIS — H43813 Vitreous degeneration, bilateral: Secondary | ICD-10-CM | POA: Diagnosis not present

## 2018-04-06 DIAGNOSIS — H353221 Exudative age-related macular degeneration, left eye, with active choroidal neovascularization: Secondary | ICD-10-CM | POA: Diagnosis not present

## 2018-04-17 DIAGNOSIS — R0689 Other abnormalities of breathing: Secondary | ICD-10-CM | POA: Diagnosis not present

## 2018-04-17 DIAGNOSIS — J8410 Pulmonary fibrosis, unspecified: Secondary | ICD-10-CM | POA: Diagnosis not present

## 2018-04-28 ENCOUNTER — Encounter: Payer: Self-pay | Admitting: Internal Medicine

## 2018-04-28 ENCOUNTER — Ambulatory Visit (INDEPENDENT_AMBULATORY_CARE_PROVIDER_SITE_OTHER): Payer: Medicare Other | Admitting: Internal Medicine

## 2018-04-28 VITALS — BP 138/86 | HR 75 | Ht 72.0 in | Wt 197.0 lb

## 2018-04-28 DIAGNOSIS — Z0000 Encounter for general adult medical examination without abnormal findings: Secondary | ICD-10-CM

## 2018-04-28 DIAGNOSIS — E1140 Type 2 diabetes mellitus with diabetic neuropathy, unspecified: Secondary | ICD-10-CM

## 2018-04-28 DIAGNOSIS — J9611 Chronic respiratory failure with hypoxia: Secondary | ICD-10-CM | POA: Diagnosis not present

## 2018-04-28 DIAGNOSIS — I5032 Chronic diastolic (congestive) heart failure: Secondary | ICD-10-CM

## 2018-04-28 DIAGNOSIS — I700 Atherosclerosis of aorta: Secondary | ICD-10-CM

## 2018-04-28 DIAGNOSIS — N184 Chronic kidney disease, stage 4 (severe): Secondary | ICD-10-CM

## 2018-04-28 DIAGNOSIS — J849 Interstitial pulmonary disease, unspecified: Secondary | ICD-10-CM | POA: Diagnosis not present

## 2018-04-28 DIAGNOSIS — Z7189 Other specified counseling: Secondary | ICD-10-CM

## 2018-04-28 DIAGNOSIS — I25119 Atherosclerotic heart disease of native coronary artery with unspecified angina pectoris: Secondary | ICD-10-CM

## 2018-04-28 LAB — HEMOGLOBIN A1C: HEMOGLOBIN A1C: 8.1 % — AB (ref 4.6–6.5)

## 2018-04-28 LAB — RENAL FUNCTION PANEL
Albumin: 4.1 g/dL (ref 3.5–5.2)
BUN: 32 mg/dL — ABNORMAL HIGH (ref 6–23)
CO2: 34 mEq/L — ABNORMAL HIGH (ref 19–32)
Calcium: 10.6 mg/dL — ABNORMAL HIGH (ref 8.4–10.5)
Chloride: 95 mEq/L — ABNORMAL LOW (ref 96–112)
Creatinine, Ser: 2.24 mg/dL — ABNORMAL HIGH (ref 0.40–1.50)
GFR: 28.02 mL/min — ABNORMAL LOW (ref >60.00)
Glucose, Bld: 305 mg/dL — ABNORMAL HIGH (ref 70–99)
Phosphorus: 3.3 mg/dL (ref 2.3–4.6)
Potassium: 4.8 mEq/L (ref 3.5–5.1)
SODIUM: 136 meq/L (ref 135–145)

## 2018-04-28 LAB — MICROALBUMIN / CREATININE URINE RATIO
CREATININE, U: 88.3 mg/dL
Microalb Creat Ratio: 8.5 mg/g (ref 0.0–30.0)
Microalb, Ur: 7.5 mg/dL — ABNORMAL HIGH (ref 0.0–1.9)

## 2018-04-28 LAB — HEPATIC FUNCTION PANEL
ALBUMIN: 4.1 g/dL (ref 3.5–5.2)
ALK PHOS: 58 U/L (ref 39–117)
ALT: 14 U/L (ref 0–53)
AST: 17 U/L (ref 0–37)
BILIRUBIN DIRECT: 0 mg/dL (ref 0.0–0.3)
TOTAL PROTEIN: 8.2 g/dL (ref 6.0–8.3)
Total Bilirubin: 0.6 mg/dL (ref 0.2–1.2)

## 2018-04-28 LAB — CBC
HCT: 44.4 % (ref 39.0–52.0)
HEMOGLOBIN: 15.1 g/dL (ref 13.0–17.0)
MCHC: 34.1 g/dL (ref 30.0–36.0)
MCV: 86.8 fl (ref 78.0–100.0)
PLATELETS: 189 10*3/uL (ref 150.0–400.0)
RBC: 5.11 Mil/uL (ref 4.22–5.81)
RDW: 13.4 % (ref 11.5–15.5)
WBC: 8.3 10*3/uL (ref 4.0–10.5)

## 2018-04-28 LAB — HM DIABETES FOOT EXAM

## 2018-04-28 NOTE — Patient Instructions (Signed)
You can try adding senna s ---- 2 tabs once or twice a day---to the lactulose (to see if it helps your bowels)

## 2018-04-28 NOTE — Assessment & Plan Note (Signed)
Oxygen nightly

## 2018-04-28 NOTE — Progress Notes (Signed)
Subjective:    Patient ID: [name], male    DOB: 04/05/1932, 85 y.o.   MRN: [id number]  HPI Here for Medicare wellness visit and follow up of chronic health conditions Reviewed form and advanced directives Reviewed other doctors No tobacco or alcohol Tries to exercise regularly (what he can) Chronic vision problems Poor hearing [place] once in past year--no sig injury No regular depression or anhedonia Still drives, does all instrumental ADLs. Some trouble with stairs Memory has been okay  Just had botox shot in esophagus at [facility] This has helped his swallowing considerably Colonoscopy in past year also--had capsule study also No apparent bleeding Still having constipation  amitiza and linzess on list--he is not sure that he is taking these Now on lactulose  Seeing pain specialist On hydrocodone up to three times a day This is helping Mostly in knees now that back is better  Still sees [name] No meds for the RA currently  Checks sugars once a week or so Usually in 180's No low sugar reactions Ongoing severe neuropathy and nephropathy Keeps up with nephrology  Chronic lung disease Needs the oxygen at night Not too much cough DOE--- fairly stable  No chest pain Hasn't needed nitro lately Chronic DOE--hard to tell if angina No edema Sleeps in recliner or bed. Keeps head up--no PND Known aortic atherosclerosis  Current Outpatient Medications on File Prior to Visit  Medication Sig Dispense Refill  . aspirin EC 81 MG tablet Take 81 mg by mouth at bedtime.    . Blood Glucose Monitoring Suppl (ONE TOUCH ULTRA 2) w/Device KIT Use to obtain blood sugar daily. Dx Code E11.40 1 each 0  . carvedilol (COREG) 3.125 MG tablet TAKE 1 TABLET BY MOUTH TWICE (2) DAILY WITH MEALS 180 tablet 3  . cholecalciferol (VITAMIN D) 1000 units tablet Take 2,000 Units by mouth daily.     . clopidogrel (PLAVIX) 75 MG tablet TAKE 1 TABLET BY MOUTH ONCE DAILY WITH BREAKFAST 90 tablet  1  . DHA-Vitamin C-Lutein (EYE HEALTH FORMULA PO) Take 1 tablet by mouth daily.     . ferrous sulfate 324 (65 Fe) MG TBEC Take 1 tablet by mouth daily.    . furosemide (LASIX) 40 MG tablet TAKE 1 TO 2 TABLETS BY MOUTH EVERY DAY 60 tablet 11  . glucose blood (ONE TOUCH ULTRA TEST) test strip USE TO CHECK BLOOD SUGAR ONCE A DAY Dx Code E11.40 100 each 3  . HYDROcodone-acetaminophen (NORCO) 10-325 MG tablet Take 1-2 tablets by mouth every 4 (four) hours as needed for severe pain. To fill on or after: 04/05/2018, 05/04/2018, 06/01/2018 180 tablet 0  . lactulose (CHRONULAC) 10 GM/15ML solution Take 20 g by mouth daily.     . nitroGLYCERIN (NITROSTAT) 0.4 MG SL tablet DISSOLVE 1 TABLET UNDER TONGUE AS NEEDEDFOR CHEST PAIN. MAY REPEAT 5 MINUTES APART 3 TIMES IF NEEDED. IF NO RELIEF CALL 911 25 tablet 1  . ondansetron (ZOFRAN) 4 MG tablet TAKE 1 TABLET BY MOUTH EVERY 8 HOURS AS NEEDED FOR NAUSEA OR VOMITING 60 tablet 0  . ONETOUCH DELICA LANCETS 93Z MISC 1 each by In Vitro route daily. Dx Code E11.49 100 each 3  . OXYGEN Inhale into the lungs. Per pt- Uses Oxygen 2 liters at night.    . pantoprazole (PROTONIX) 40 MG tablet TAKE 1 TABLET BY MOUTH TWICE (2) DAILY 60 tablet 11   No current facility-administered medications on file prior to visit.     Allergies  Allergen Reactions  . Doxazosin Mesylate Other (See Comments)    dizziness  . Methocarbamol Rash    Past Medical History:  Diagnosis Date  . Arthritis    osteoarthritis, s/p R TKR, and digits  . CAD (coronary artery disease)    a. s/p CABG (2001)  b. s/p DES to RCA and cutting POBA to ostial PDA (2013)   c. s/p DES to SVG to OM2 (01/14/14) d. cath: 08/2015 NSTEMI w/ patent LIMA-LAD and 99% stenosis of SVG-OM w/ DES placed. CTO of SVG-RCA and SVG-D1.   [name] Chronic diastolic CHF (congestive heart failure) ([place])    a) 09/13 ECHO- LVEF 28-31%, grade 1 diastolic dysfunction, mild LA dilatation, atrial septal aneurysm, AV mobility restricted, but no  sig AS by doppler; b) 09/04/08 ECHO- LVH, ef 60%, mild AS, c. echo 08/2015: EF perserved of 55-60% with inferolateral HK. Mild AS noted.  . Chronic kidney disease, stage III (moderate) ([place])   . Chronic lower back pain   . Colon polyps   . Diverticulosis   . Dyspnea 2009 since July -Sept   05/06/08-CPST-  normal effort, reduced VO2 max 20.5 /65%, reduced at 8.2/ 40%, normal breathing resetvca of 55%, submaximal heart rate response 112/77%, flattened o2 pluse response at peak exercise-12 ml/beat @ 85%, No VQ mismatch abnormalities, All c/w CIRC Limitation  . Enlarged prostate   . Esophageal stricture    a. s/p dilation spring 2010  . GERD (gastroesophageal reflux disease)   . Heart murmur   . Hiatal hernia   . History of kidney stones   . History of PFTs    mixed pattern on spiro. mild restn on lung volumes with near normal DLCO. Pattern can be explained by CABG scar. Fev1 2.2L/73%, ratio 68 (67), TLC 4.7/68%,RV 1.5L/55%,DLCO 79%  . Hyperlipidemia   . Hypertension   . Interstitial lung disease ([name])    NOS  . Nausea & vomiting    2018/2019  . Overweight (BMI 25.0-29.9)    BMI 29  . RA (rheumatoid arthritis) ([name])    [name]  . Seropositive rheumatoid arthritis ([place])   . Type II diabetes mellitus ([place])     Past Surgical History:  Procedure Laterality Date  . CARDIAC CATHETERIZATION  08/2004   CP- no MI, Cath- small vessell disease   . CARDIAC CATHETERIZATION  12/31/2011   80% distal LM, 100% native LAD, LCx and RCA, 30% prox SVG-OM, SVG-D1 normal, 99% distal, 80% ostial SVG-RCA distal to graft, LIMA-LAD normal; LVEF mildly decreased with posterior basal AK   . CARDIAC CATHETERIZATION  2009   with patent grafts/notes 12/31/2011  . CARDIAC CATHETERIZATION N/A 08/13/2015   Procedure: Left Heart Cath and Cors/Grafts Angiography;  Surgeon: Sherren Mocha, MD;  Location: [place] [facility];  Service: Cardiovascular;  Laterality: N/A;  . CATARACT EXTRACTION W/ INTRAOCULAR LENS  IMPLANT,  BILATERAL Bilateral   . CHOLECYSTECTOMY OPEN  11/2003   [name]  . CORONARY ANGIOPLASTY WITH STENT PLACEMENT  01/03/2012   Successful DES to SVG-RCA and cutting balloon angioplasty ostial  PDA   . CORONARY ANGIOPLASTY WITH STENT PLACEMENT  01/14/2014   "1"  . CORONARY ARTERY BYPASS GRAFT  11/1999   CABG X5  . CORONARY STENT PLACEMENT  02/2012   1 stent and balloon  . ESOPHAGOGASTRODUODENOSCOPY N/A 03/01/2017   Procedure: ESOPHAGOGASTRODUODENOSCOPY (EGD);  Surgeon: Irene Shipper, MD;  Location: [facility];  Service: Endoscopy;  Laterality: N/A;  . ESOPHAGOGASTRODUODENOSCOPY (EGD) WITH ESOPHAGEAL DILATION  2010  . HAND  SURGERY    . JOINT REPLACEMENT    . KNEE ARTHROSCOPY Right 2008  . LEFT AND RIGHT HEART CATHETERIZATION WITH CORONARY ANGIOGRAM  12/31/2011   Procedure: LEFT AND RIGHT HEART CATHETERIZATION WITH CORONARY ANGIOGRAM;  Surgeon: Burnell Blanks, MD;  Location: [facility] CATH LAB;  Service: Cardiovascular;;  . LEFT AND RIGHT HEART CATHETERIZATION WITH CORONARY ANGIOGRAM N/A 01/14/2014   Procedure: LEFT AND RIGHT HEART CATHETERIZATION WITH CORONARY ANGIOGRAM;  Surgeon: Peter M Martinique, MD;  Location: [facility] CATH LAB;  Service: Cardiovascular;  Laterality: N/A;  . MALONEY DILATION  03/01/2017   Procedure: Venia Minks DILATION;  Surgeon: Irene Shipper, MD;  Location: [facility];  Service: Endoscopy;;  . PERCUTANEOUS CORONARY INTERVENTION-BALLOON ONLY  01/03/2012   Procedure: PERCUTANEOUS CORONARY INTERVENTION-BALLOON ONLY;  Surgeon: Peter M Martinique, MD;  Location: [facility] CATH LAB;  Service: Cardiovascular;;  . PERCUTANEOUS CORONARY STENT INTERVENTION (PCI-S)  12/31/2011   Procedure: PERCUTANEOUS CORONARY STENT INTERVENTION (PCI-S);  Surgeon: Burnell Blanks, MD;  Location: [facility] CATH LAB;  Service: Cardiovascular;;  . PERCUTANEOUS CORONARY STENT INTERVENTION (PCI-S) N/A 01/03/2012   Procedure: PERCUTANEOUS CORONARY STENT INTERVENTION (PCI-S);  Surgeon: Peter M Martinique, MD;  Location: [facility] CATH LAB;   Service: Cardiovascular;  Laterality: N/A;  . SHOULDER ARTHROSCOPY WITH OPEN ROTATOR CUFF REPAIR AND DISTAL CLAVICLE ACROMINECTOMY Left 02/27/2013   Procedure: LEFT SHOULDER ARTHROSCOPY WITH MINI OPEN ROTATOR CUFF REPAIR AND SUBACROMIAL DECOMPRESSION AND DISTAL CLAVICLE RESECTION;  Surgeon: [name], MD;  Location: [place];  Service: Orthopedics;  Laterality: Left;  . TOTAL KNEE ARTHROPLASTY Right 03/2010   [name]  . TRIGGER FINGER RELEASE Left 02/27/2013   Procedure: RELEASE TRIGGER FINGER/A-1 PULLEY;  Surgeon: [name], MD;  Location: [place];  Service: Orthopedics;  Laterality: Left;    Family History  Problem Relation Age of Onset  . COPD Mother   . Heart disease Father   . Heart attack Father   . Diabetes Brother   . Colon cancer Brother 25  . Alcohol abuse Sister   . Stroke Sister     Social History   Socioeconomic History  . Marital status: Married    Spouse name: Not on file  . Number of children: 3  . Years of education: Not on file  . Highest education level: Not on file  Occupational History  . 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 Financial resource strain: Not on file  . Food insecurity:    Worry: Not on file    Inability: Not on file  . Transportation needs:    Medical: Not on file    Non-medical: Not on file  Tobacco Use  . Smoking status: Former Smoker    Packs/day: 1.00    Years: 20.00    Pack years: 20.00    Types: Cigarettes    Last attempt to quit: 04/06/1963    Years since quitting: 55.0  . Smokeless tobacco: Never Used  Substance and Sexual Activity  . Alcohol use: No    Alcohol/week: 0.0 standard drinks    Comment: 01/01/2012 "last alcohol ~ 50 yr ago"  . Drug use: No  . Sexual activity: Not Currently  Lifestyle  . Physical activity:    Days per week: Not on file    Minutes per session: Not on file  . Stress: Not on file  Relationships  . Social connections:    Talks on  phone: Not on file  Gets together: Not on file    Attends religious service: Not on file    Active member of club or organization: Not on file    Attends meetings of clubs or organizations: Not on file    Relationship status: Not on file  . Intimate partner violence:    Fear of current or ex partner: Not on file    Emotionally abused: Not on file    Physically abused: Not on file    Forced sexual activity: Not on file  Other Topics Concern  . Not on file  Social History Narrative   No living will   Requests wife as health care POA   Discussed DNR --he requests this (done 08/29/12)   Not sure about feeding tube---but might accept for some time   Patient lives with wife and daughter in a one story home.  Has 3 children.  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   Review of Systems Sleeps fairly well Appetite fine Weight fairly stable Wears seat belt Some trouble with teeth--going back to dentist (new partial is loose) Occasional heartburn---lots of gas. No dysphagia     Objective:   Physical Exam  Constitutional: He is oriented to person, place, and time. He appears well-developed. No distress.  HENT:  Mouth/Throat: Oropharynx is clear and moist. No oropharyngeal exudate.  Neck: No thyromegaly present.  Cardiovascular: Normal rate, regular rhythm, normal heart sounds and intact distal pulses. Exam reveals no gallop.  No murmur heard. Respiratory:  Decreased breath sounds Loud bibasilar crackles  GI: Soft. There is no abdominal tenderness.  Musculoskeletal:        General: No tenderness or edema.  Lymphadenopathy:    He has no cervical adenopathy.  Neurological: He is alert and oriented to person, place, and time.  President--- "[name], [name], ?" [phone] D-l-[id number]-w Recall 3/3  Decreased sensation in feet  Skin: No rash noted. No erythema.  No foot lesions  Psychiatric: He has a normal mood and affect. His behavior is normal.             Assessment & Plan:

## 2018-04-28 NOTE — Assessment & Plan Note (Signed)
On ASA Cholesterol low

## 2018-04-28 NOTE — Assessment & Plan Note (Signed)
DOE is stable Hasn't needed nitro

## 2018-04-28 NOTE — Assessment & Plan Note (Signed)
Compensated Neutral weight status

## 2018-04-28 NOTE — Assessment & Plan Note (Signed)
Has DNR 

## 2018-04-28 NOTE — Assessment & Plan Note (Signed)
Reasonable control considering comorbidity Bad neuropathy Check A1c

## 2018-04-28 NOTE — Assessment & Plan Note (Signed)
Will recheck Sees nephrology

## 2018-04-28 NOTE — Assessment & Plan Note (Signed)
I have personally reviewed the Medicare Annual Wellness questionnaire and have noted 1. The patient's medical and social history 2. Their use of alcohol, tobacco or illicit drugs 3. Their current medications and supplements 4. The patient's functional ability including ADL's, fall risks, home safety risks and hearing or visual             impairment. 5. Diet and physical activities 6. Evidence for depression or mood disorders  The patients weight, height, BMI and visual acuity have been recorded in the chart I have made referrals, counseling and provided education to the patient based review of the above and I have provided the pt with a written personalized care plan for preventive services.  I have provided you with a copy of your personalized plan for preventive services. Please take the time to review along with your updated medication list.  No cancer screening due to age (though just had colon) Trying to exercise Yearly flu vaccine

## 2018-04-28 NOTE — Assessment & Plan Note (Signed)
Chronic DOE---lungs and cardiac Stable status

## 2018-05-15 ENCOUNTER — Telehealth: Payer: Self-pay | Admitting: Internal Medicine

## 2018-05-15 NOTE — Telephone Encounter (Signed)
Routine appointment ok

## 2018-05-16 ENCOUNTER — Ambulatory Visit: Payer: Medicare Other | Admitting: Internal Medicine

## 2018-05-16 NOTE — Telephone Encounter (Signed)
Called patient, no answer and no vmail setup.  Will try again later

## 2018-05-18 DIAGNOSIS — R0689 Other abnormalities of breathing: Secondary | ICD-10-CM | POA: Diagnosis not present

## 2018-05-18 DIAGNOSIS — J8410 Pulmonary fibrosis, unspecified: Secondary | ICD-10-CM | POA: Diagnosis not present

## 2018-05-22 ENCOUNTER — Other Ambulatory Visit: Payer: Self-pay | Admitting: Internal Medicine

## 2018-05-31 ENCOUNTER — Other Ambulatory Visit: Payer: Self-pay | Admitting: Student in an Organized Health Care Education/Training Program

## 2018-06-15 ENCOUNTER — Encounter: Payer: Medicare Other | Admitting: Student in an Organized Health Care Education/Training Program

## 2018-06-16 DIAGNOSIS — J8410 Pulmonary fibrosis, unspecified: Secondary | ICD-10-CM | POA: Diagnosis not present

## 2018-06-16 DIAGNOSIS — R0689 Other abnormalities of breathing: Secondary | ICD-10-CM | POA: Diagnosis not present

## 2018-06-20 ENCOUNTER — Telehealth: Payer: Self-pay

## 2018-06-20 NOTE — Telephone Encounter (Signed)

## 2018-06-21 ENCOUNTER — Ambulatory Visit: Payer: Medicare Other | Admitting: Internal Medicine

## 2018-06-21 ENCOUNTER — Encounter: Payer: Self-pay | Admitting: Student in an Organized Health Care Education/Training Program

## 2018-06-21 ENCOUNTER — Ambulatory Visit
Payer: Medicare Other | Attending: Student in an Organized Health Care Education/Training Program | Admitting: Student in an Organized Health Care Education/Training Program

## 2018-06-21 ENCOUNTER — Other Ambulatory Visit: Payer: Self-pay

## 2018-06-21 ENCOUNTER — Encounter: Payer: Self-pay | Admitting: Internal Medicine

## 2018-06-21 VITALS — Temp 94.6°F | Ht 72.0 in | Wt 196.6 lb

## 2018-06-21 VITALS — BP 179/85 | HR 63 | Temp 97.6°F | Resp 18 | Ht 73.0 in | Wt 196.0 lb

## 2018-06-21 DIAGNOSIS — G8929 Other chronic pain: Secondary | ICD-10-CM | POA: Diagnosis not present

## 2018-06-21 DIAGNOSIS — K5900 Constipation, unspecified: Secondary | ICD-10-CM

## 2018-06-21 DIAGNOSIS — M059 Rheumatoid arthritis with rheumatoid factor, unspecified: Secondary | ICD-10-CM | POA: Diagnosis not present

## 2018-06-21 DIAGNOSIS — M792 Neuralgia and neuritis, unspecified: Secondary | ICD-10-CM | POA: Insufficient documentation

## 2018-06-21 DIAGNOSIS — G894 Chronic pain syndrome: Secondary | ICD-10-CM | POA: Diagnosis not present

## 2018-06-21 DIAGNOSIS — Z79891 Long term (current) use of opiate analgesic: Secondary | ICD-10-CM | POA: Diagnosis not present

## 2018-06-21 DIAGNOSIS — K222 Esophageal obstruction: Secondary | ICD-10-CM | POA: Diagnosis not present

## 2018-06-21 DIAGNOSIS — K224 Dyskinesia of esophagus: Secondary | ICD-10-CM | POA: Diagnosis not present

## 2018-06-21 DIAGNOSIS — R110 Nausea: Secondary | ICD-10-CM | POA: Diagnosis not present

## 2018-06-21 DIAGNOSIS — G629 Polyneuropathy, unspecified: Secondary | ICD-10-CM | POA: Insufficient documentation

## 2018-06-21 DIAGNOSIS — E1142 Type 2 diabetes mellitus with diabetic polyneuropathy: Secondary | ICD-10-CM | POA: Diagnosis not present

## 2018-06-21 DIAGNOSIS — M79604 Pain in right leg: Secondary | ICD-10-CM

## 2018-06-21 DIAGNOSIS — M79605 Pain in left leg: Secondary | ICD-10-CM | POA: Insufficient documentation

## 2018-06-21 MED ORDER — HYDROCODONE-ACETAMINOPHEN 10-325 MG PO TABS: 1.0000 | ORAL_TABLET | Freq: Four times a day (QID) | ORAL | 0 refills | Status: DC | PRN

## 2018-06-21 NOTE — Progress Notes (Signed)
HISTORY OF PRESENT ILLNESS:  [name] is a 85 y.o. male with multiple significant medical problems who I last saw May 12, 2017 regarding complex dysphasia felt secondary to both esophageal stricture and high probability esophageal dysmotility.  He had just undergone esophageal dilation of his stricture with resolution of the solid food dysphasia component but not the liquid component.  We also addressed his issues with constipation and chronic nausea.  I recommended PPI, on-demand antinausea medicine, MiraLAX, and esophageal manometry.  He wished to hold off on manometry.  At the urging of his daughter he went to [facility] for a GI opinion.  He was noted to be iron deficient for which he underwent colonoscopy and standard upper endoscopy Aug 19, 2017.  Colonoscopy was normal with some preparation limitations.  Upper endoscopy was unremarkable including gastric and duodenal biopsies.  Capsule endoscopy was performed September 13, 2017 and was negative except for a small nonbleeding duodenal AVM and nonspecific red spots.  He was treated with iron supplementation therapy with normalization of his blood counts.  He subsequently underwent high-resolution esophageal manometry September 2019 which revealed hypertensive lower esophageal sphincter.  Thus, on March 20, 2018 he underwent upper endoscopy with Botox injection at the distal esophageal sphincter (100 units).  Patient wished to reestablish GI care at this office due to traveling convenience.  He is pleased to tell me that his swallowing has markedly improved after Botox injection therapy.  Issues with nausea have also improved.  He is still having significant issues with constipation and wishes to have some help regarding his constipation.  Has been using senna and lactulose a bit sporadically most recently.  He tells me that he has tried multiple agents with unsatisfactory results.  Currently having 2 or 3 bowel movements per week.  Describes a  lot of straining.  Review of outside laboratories from April 28, 2018 shows creatinine 2.24.  Normal blood counts with hemoglobin 15.1.  MCV 86.8.  REVIEW OF SYSTEMS:  All non-GI ROS negative unless otherwise stated in the HPI except for visual change, excessive thirst,  Past Medical History:  Diagnosis Date  . Arthritis    osteoarthritis, s/p R TKR, and digits  . CAD (coronary artery disease)    a. s/p CABG (2001)  b. s/p DES to RCA and cutting POBA to ostial PDA (2013)   c. s/p DES to SVG to OM2 (01/14/14) d. cath: 08/2015 NSTEMI w/ patent LIMA-LAD and 99% stenosis of SVG-OM w/ DES placed. CTO of SVG-RCA and SVG-D1.   [name] Chronic diastolic CHF (congestive heart failure) ([place])    a) 09/13 ECHO- LVEF 82-99%, grade 1 diastolic dysfunction, mild LA dilatation, atrial septal aneurysm, AV mobility restricted, but no sig AS by doppler; b) 09/04/08 ECHO- LVH, ef 60%, mild AS, c. echo 08/2015: EF perserved of 55-60% with inferolateral HK. Mild AS noted.  . Chronic kidney disease, stage III (moderate) ([place])   . Chronic lower back pain   . Colon polyps   . Diverticulosis   . Dyspnea 2009 since July -Sept   05/06/08-CPST-  normal effort, reduced VO2 max 20.5 /65%, reduced at 8.2/ 40%, normal breathing resetvca of 55%, submaximal heart rate response 112/77%, flattened o2 pluse response at peak exercise-12 ml/beat @ 85%, No VQ mismatch abnormalities, All c/w CIRC Limitation  . Enlarged prostate   . Esophageal stricture    a. s/p dilation spring 2010  . GERD (gastroesophageal reflux disease)   . Heart murmur   . Hiatal  hernia   . History of kidney stones   . History of PFTs    mixed pattern on spiro. mild restn on lung volumes with near normal DLCO. Pattern can be explained by CABG scar. Fev1 2.2L/73%, ratio 68 (67), TLC 4.7/68%,RV 1.5L/55%,DLCO 79%  . Hyperlipidemia   . Hypertension   . Interstitial lung disease ([name])    NOS  . Nausea & vomiting    2018/2019  . Overweight (BMI 25.0-29.9)    BMI  29  . RA (rheumatoid arthritis) ([name])    [name]  . Seropositive rheumatoid arthritis ([place])   . Type II diabetes mellitus ([place])     Past Surgical History:  Procedure Laterality Date  . CARDIAC CATHETERIZATION  08/2004   CP- no MI, Cath- small vessell disease   . CARDIAC CATHETERIZATION  12/31/2011   80% distal LM, 100% native LAD, LCx and RCA, 30% prox SVG-OM, SVG-D1 normal, 99% distal, 80% ostial SVG-RCA distal to graft, LIMA-LAD normal; LVEF mildly decreased with posterior basal AK   . CARDIAC CATHETERIZATION  2009   with patent grafts/notes 12/31/2011  . CARDIAC CATHETERIZATION N/A 08/13/2015   Procedure: Left Heart Cath and Cors/Grafts Angiography;  Surgeon: Sherren Mocha, MD;  Location: [place] [facility];  Service: Cardiovascular;  Laterality: N/A;  . CATARACT EXTRACTION W/ INTRAOCULAR LENS  IMPLANT, BILATERAL Bilateral   . CHOLECYSTECTOMY OPEN  11/2003   [name]  . CORONARY ANGIOPLASTY WITH STENT PLACEMENT  01/03/2012   Successful DES to SVG-RCA and cutting balloon angioplasty ostial  PDA   . CORONARY ANGIOPLASTY WITH STENT PLACEMENT  01/14/2014   "1"  . CORONARY ARTERY BYPASS GRAFT  11/1999   CABG X5  . CORONARY STENT PLACEMENT  02/2012   1 stent and balloon  . ESOPHAGOGASTRODUODENOSCOPY N/A 03/01/2017   Procedure: ESOPHAGOGASTRODUODENOSCOPY (EGD);  Surgeon: Irene Shipper, MD;  Location: [facility];  Service: Endoscopy;  Laterality: N/A;  . ESOPHAGOGASTRODUODENOSCOPY (EGD) WITH ESOPHAGEAL DILATION  2010  . HAND SURGERY    . JOINT REPLACEMENT    . KNEE ARTHROSCOPY Right 2008  . LEFT AND RIGHT HEART CATHETERIZATION WITH CORONARY ANGIOGRAM  12/31/2011   Procedure: LEFT AND RIGHT HEART CATHETERIZATION WITH CORONARY ANGIOGRAM;  Surgeon: Burnell Blanks, MD;  Location: [facility] CATH LAB;  Service: Cardiovascular;;  . LEFT AND RIGHT HEART CATHETERIZATION WITH CORONARY ANGIOGRAM N/A 01/14/2014   Procedure: LEFT AND RIGHT HEART CATHETERIZATION WITH CORONARY ANGIOGRAM;  Surgeon:  Peter M Martinique, MD;  Location: [facility] CATH LAB;  Service: Cardiovascular;  Laterality: N/A;  . MALONEY DILATION  03/01/2017   Procedure: Venia Minks DILATION;  Surgeon: Irene Shipper, MD;  Location: [facility];  Service: Endoscopy;;  . PERCUTANEOUS CORONARY INTERVENTION-BALLOON ONLY  01/03/2012   Procedure: PERCUTANEOUS CORONARY INTERVENTION-BALLOON ONLY;  Surgeon: Peter M Martinique, MD;  Location: [facility] CATH LAB;  Service: Cardiovascular;;  . PERCUTANEOUS CORONARY STENT INTERVENTION (PCI-S)  12/31/2011   Procedure: PERCUTANEOUS CORONARY STENT INTERVENTION (PCI-S);  Surgeon: Burnell Blanks, MD;  Location: [facility] CATH LAB;  Service: Cardiovascular;;  . PERCUTANEOUS CORONARY STENT INTERVENTION (PCI-S) N/A 01/03/2012   Procedure: PERCUTANEOUS CORONARY STENT INTERVENTION (PCI-S);  Surgeon: Peter M Martinique, MD;  Location: [facility] CATH LAB;  Service: Cardiovascular;  Laterality: N/A;  . SHOULDER ARTHROSCOPY WITH OPEN ROTATOR CUFF REPAIR AND DISTAL CLAVICLE ACROMINECTOMY Left 02/27/2013   Procedure: LEFT SHOULDER ARTHROSCOPY WITH MINI OPEN ROTATOR CUFF REPAIR AND SUBACROMIAL DECOMPRESSION AND DISTAL CLAVICLE RESECTION;  Surgeon: [name], MD;  Location: [place];  Service: Orthopedics;  Laterality: Left;  .  TOTAL KNEE ARTHROPLASTY Right 03/2010   [name]  . TRIGGER FINGER RELEASE Left 02/27/2013   Procedure: RELEASE TRIGGER FINGER/A-1 PULLEY;  Surgeon: [name], MD;  Location: [place];  Service: Orthopedics;  Laterality: Left;    Social History [name]  reports that he quit smoking about 55 years ago. His smoking use included cigarettes. He has a 20.00 pack-year smoking history. He has never used smokeless tobacco. He reports that he does not drink alcohol or use drugs.  family history includes Alcohol abuse in his sister; COPD in his mother; Colon cancer (age of onset: 59) in his brother; Diabetes in his brother; Heart attack in his father; Heart disease in his father; Stroke in his  sister.  Allergies  Allergen Reactions  . Doxazosin Mesylate Other (See Comments)    dizziness  . Methocarbamol Rash       PHYSICAL EXAMINATION: Vital signs: Temp (!) 94.6 F (34.8 C)   Ht 6' (1.829 m)   Wt 196 lb 9.6 oz (89.2 kg)   BMI 26.66 kg/m   Constitutional: generally well-appearing, no acute distress Psychiatric: alert and oriented x3, cooperative Eyes: extraocular movements intact, anicteric, conjunctiva pink Mouth: oral pharynx moist, no lesions Neck: supple no lymphadenopathy Cardiovascular: heart regular rate and rhythm,  Lungs: clear to auscultation bilaterally Abdomen: soft, nontender, nondistended, no obvious ascites, no peritoneal signs, normal bowel sounds, no organomegaly Rectal: Omitted Extremities: no lower extremity edema bilaterally Skin: no lesions on visible extremities Neuro: No focal deficits.  Cranial nerves intact  ASSESSMENT:  1.  Functional constipation.  Ongoing and problematic 2.  Dysphagia secondary to esophageal stricture and dysmotility status post stricture dilation and more recent Botox injection therapy at [facility] December 2019.  Dysphasia improved 3.  History of chronic nausea.  Improved 4.  History of iron deficiency anemia.  Extensive work-up as outlined.  Presumably secondary to small bowel AVMs.  Resolved with iron replacement therapy 5.  Multiple significant medical problems  PLAN:  1.  Recommend taking senna, 2 at night 2.  Recommend taking MiraLAX daily.  Start at 3 doses daily.  Titrate to accommodate desired result 3.  Contact this office if therapies for constipation not working satisfactorily 4.  Otherwise, resume general medical care with primary provider  25 minutes spent face-to-face with the patient.  Greater than 50% of the time used for counseling regarding his above listed GI problems.  Reviewing their work-up and treatments, as well as detailed treatment of his issues with constipation.

## 2018-06-21 NOTE — Patient Instructions (Signed)
As discussed with [name], take 2 Senna capsules at night.  Take three capfuls of Miralax in 20 ounces of water or juice in the morning.  To help prevent the possible spread of infection to our patients, communities, and staff; we will be implementing the following measures:  Please only allow one visitor/family member to accompany you to any upcoming appointments with [facility]. If you have any concerns about this please contact our office to discuss prior to the appointment.

## 2018-06-21 NOTE — Progress Notes (Signed)
Nursing Pain Medication Assessment:  Safety precautions to be maintained throughout the outpatient stay will include: orient to surroundings, keep bed in low position, maintain call bell within reach at all times, provide assistance with transfer out of bed and ambulation.  Medication Inspection Compliance: Pill count conducted under aseptic conditions, in front of the patient. Neither the pills nor the bottle was removed from the patient's sight at any time. Once count was completed pills were immediately returned to the patient in their original bottle.  Medication: Hydrocodone/APAP Pill/Patch Count: 12 of 180 pills remain Pill/Patch Appearance: Markings consistent with prescribed medication Bottle Appearance: Standard pharmacy container. Clearly labeled. Filled Date: 01 / 30 / 2020 Last Medication intake:  Today

## 2018-06-22 NOTE — Progress Notes (Signed)
atient's Name: [name]  MRN: [id number]  Referring Provider: [name], MD  DOB: 09-30-1931  PCP: [name], MD  DOS: 06/21/2018  Note by: Gillis Santa, MD  Service setting: Ambulatory outpatient  Specialty: Interventional Pain Management  Location: [facility] (AMB) Pain Management Facility    Patient type: Established   Primary Reason(s) for Visit: Encounter for prescription drug management. (Level of risk: moderate)  CC: Foot Pain (bilateral)  HPI  [name] is a 85 y.o. year old, male patient, who comes today for a medication management evaluation. He has Familial multiple lipoprotein-type hyperlipidemia; Obstructive sleep apnea; Essential hypertension; Coronary atherosclerosis; Reflux esophagitis; ESOPHAGEAL STRICTURE; GERD; Diverticulosis of colon; BENIGN PROSTATIC HYPERTROPHY; Actinic keratosis; SLEEP DISORDER, CHRONIC; Chronic diastolic heart failure ([name]); ILD (interstitial lung disease) ([place]); CKD (chronic kidney disease) stage 4, GFR 15-29 ml/min ([place]); Routine general medical examination at a health care facility; Seropositive rheumatoid arthritis ([place]); Ventral hernia; DM (diabetes mellitus) type II controlled, neurological manifestation ([place]); Atherosclerotic heart disease of native coronary artery with angina pectoris ([place]); Respiratory failure, chronic ([place]); Spinal stenosis of lumbar region without neurogenic claudication; Spondylosis without myelopathy or radiculopathy, lumbar region; Tegretol-induced dizziness; Diarrhea; Orthostatic hypotension; High risk medication use; Primary osteoarthritis of both knees; History of right knee joint replacement; Dysphagia; Esophageal stricture; Aortic valve disorder; Enlarged prostate without lower urinary tract symptoms (luts); Iron deficiency anemia; Rheumatic fever without heart involvement; Type 2 diabetes mellitus with diabetic polyneuropathy ([place]); Thoracic aorta atherosclerosis ([place]); Constipation; HLD (hyperlipidemia);  Rheumatoid arthritis ([place]); Gastro-esophageal reflux disease with esophagitis; Routine history and physical examination of adult; Disturbance in sleep behavior; Chronic pain of both lower extremities; Neuropathic pain; Chronic pain syndrome; and Advance directive discussed with patient on their problem list. His primarily concern today is the Foot Pain (bilateral)  Pain Assessment: Location: Right, Left Foot Radiating:   Onset: More than a month ago Duration: Chronic pain Quality: Burning, Constant Severity: 7 /10 (subjective, self-reported pain score)  Note: Reported level is inconsistent with clinical observations. Clinically the patient looks like a 2/10 A 2/10 is viewed as "Mild to Moderate" and described as noticeable and distracting. Impossible to hide from other people. More frequent flare-ups. Still possible to adapt and function close to normal. It can be very annoying and may have occasional stronger flare-ups. With discipline, patients may get used to it and adapt.       When using our objective Pain Scale, levels between 6 and 10/10 are said to belong in an emergency room, as it progressively worsens from a 6/10, described as severely limiting, requiring emergency care not usually available at an outpatient pain management facility. At a 6/10 level, communication becomes difficult and requires great effort. Assistance to reach the emergency department may be required. Facial flushing and profuse sweating along with potentially dangerous increases in heart rate and blood pressure will be evident. Effect on ADL:   Timing: Constant Modifying factors: medications BP: (!) 179/85  HR: 63  [name] was last scheduled for an appointment on 05/31/2018 for medication management. During today's appointment we reviewed [name] chronic pain status, as well as his outpatient medication regimen.  Presents for medication management.  No changes in medical history.  Has been utilizing less  hydrocodone, approximately 4-5 a day.  Discussed decreasing his monthly allotment to 120.  The patient  reports no history of drug use. His body mass index is 25.86 kg/m.  Further details on both, my assessment(s), as well as the proposed treatment plan,  please see below.  Controlled Substance Pharmacotherapy Assessment REMS (Risk Evaluation and Mitigation Strategy)   05/04/2018  1   03/21/2018  Hydrocodone-Acetamin 10-325 MG  180.00 15 Bi Lat   [id number]   Gib (4800)   0  120.00 MME  Medicare   [place]    Hart Rochester, RN  06/21/2018  1:52 PM  Sign when Signing Visit Nursing Pain Medication Assessment:  Safety precautions to be maintained throughout the outpatient stay will include: orient to surroundings, keep bed in low position, maintain call bell within reach at all times, provide assistance with transfer out of bed and ambulation.  Medication Inspection Compliance: Pill count conducted under aseptic conditions, in front of the patient. Neither the pills nor the bottle was removed from the patient's sight at any time. Once count was completed pills were immediately returned to the patient in their original bottle.  Medication: Hydrocodone/APAP Pill/Patch Count: 12 of 180 pills remain Pill/Patch Appearance: Markings consistent with prescribed medication Bottle Appearance: Standard pharmacy container. Clearly labeled. Filled Date: 01 / 30 / 2020 Last Medication intake:  Today   Pharmacokinetics: Liberation and absorption (onset of action): WNL Distribution (time to peak effect): WNL Metabolism and excretion (duration of action): WNL         Pharmacodynamics: Desired effects: Analgesia: [name] reports >50% benefit. Functional ability: Patient reports that medication allows him to accomplish basic ADLs Clinically meaningful improvement in function (CMIF): Sustained CMIF goals met Perceived effectiveness: Described as relatively effective, allowing for increase in activities of  daily living (ADL) Undesirable effects: Side-effects or Adverse reactions: None reported Monitoring: [place] PMP: Online review of the past 40-monthperiod conducted. Compliant with practice rules and regulations Last UDS on record: Summary  Date Value Ref Range Status  10/26/2017 FINAL  Final    Comment:    ==================================================================== TOXASSURE COMP DRUG ANALYSIS,UR ==================================================================== Test                             Result       Flag       Units Drug Present and Declared for Prescription Verification   Hydrocodone                    2548         EXPECTED   ng/mg creat   Hydromorphone                  124          EXPECTED   ng/mg creat   Dihydrocodeine                 251          EXPECTED   ng/mg creat   Norhydrocodone                 1816         EXPECTED   ng/mg creat    Sources of hydrocodone include scheduled prescription    medications. Hydromorphone, dihydrocodeine and norhydrocodone are    expected metabolites of hydrocodone. Hydromorphone and    dihydrocodeine are also available as scheduled prescription    medications.   Acetaminophen                  PRESENT      EXPECTED Drug Absent but Declared for Prescription Verification   Salicylate  Not Detected UNEXPECTED    Aspirin, as indicated in the declared medication list, is not    always detected even when used as directed. ==================================================================== Test                      Result    Flag   Units      Ref Range   Creatinine              87               mg/dL      >=20 ==================================================================== Declared Medications:  The flagging and interpretation on this report are based on the  following declared medications.  Unexpected results may arise from  inaccuracies in the declared medications.  **Note: The testing scope of this panel  includes these medications:  Hydrocodone (Norco)  **Note: The testing scope of this panel does not include small to  moderate amounts of these reported medications:  Acetaminophen (Norco)  Aspirin (Aspirin 81)  **Note: The testing scope of this panel does not include following  reported medications:  Carvedilol (Coreg)  Clopidogrel (Plavix)  Furosemide (Lasix)  Iron (Ferrous Sulfate)  Lubiprostone (Amitiza)  Nitroglycerin (Nitrostat)  Ondansetron (Zofran)  Oxygen  Pantoprazole (Protonix)  Vitamin C  Vitamin D ==================================================================== For clinical consultation, please call [phone]. ====================================================================    UDS interpretation: Compliant          Medication Assessment Form: Reviewed. Patient indicates being compliant with therapy Treatment compliance: Compliant Risk Assessment Profile: Aberrant behavior: See initial evaluations. None observed or detected today Comorbid factors increasing risk of overdose: See initial evaluation. No additional risks detected today Opioid risk tool (ORT):  Opioid Risk  06/21/2018  Alcohol 0  Illegal Drugs 0  Rx Drugs 0  Alcohol 0  Illegal Drugs 0  Rx Drugs 0  Age between 16-45 years  0  History of Preadolescent Sexual Abuse 0  Psychological Disease 0  Depression 0  Opioid Risk Tool Scoring 0  Opioid Risk Interpretation Low Risk    ORT Scoring interpretation table:  Score <3 = Low Risk for SUD  Score between 4-7 = Moderate Risk for SUD  Score >8 = High Risk for Opioid Abuse   Risk of substance use disorder (SUD): Low  Risk Mitigation Strategies:  Patient Counseling: Covered Patient-Prescriber Agreement (PPA): Present and active  Notification to other healthcare providers: Done  Pharmacologic Plan: Decrease quantity from 180-->120, allow 4 times daily dosing PRN             Laboratory Chemistry  Inflammation Markers (CRP: Acute  Phase) (ESR: Chronic Phase) Lab Results  Component Value Date   ESRSEDRATE 38 (H) 11/22/2011                         Rheumatology Markers Lab Results  Component Value Date   RF 23 (H) 11/22/2011   ANA NEG 11/22/2011                        Renal Function Markers Lab Results  Component Value Date   BUN 32 (H) 04/28/2018   CREATININE 2.24 (H) 04/28/2018   BCR 13 06/24/2017   GFRAA 30 (L) 06/24/2017   GFRNONAA 26 (L) 06/24/2017                             Hepatic Function Markers Lab Results  Component Value Date   AST 17 04/28/2018   ALT 14 04/28/2018   ALBUMIN 4.1 04/28/2018   ALBUMIN 4.1 04/28/2018   ALKPHOS 58 04/28/2018                        Electrolytes Lab Results  Component Value Date   NA 136 04/28/2018   K 4.8 04/28/2018   CL 95 (L) 04/28/2018   CALCIUM 10.6 (H) 04/28/2018   MG 1.9 04/08/2017   PHOS 3.3 04/28/2018                        Neuropathy Markers Lab Results  Component Value Date   VITAMINB12 253 08/10/2016   FOLATE 17.6 08/10/2016   HGBA1C 8.1 (H) 04/28/2018                        CNS Tests No results found for: COLORCSF, APPEARCSF, RBCCOUNTCSF, WBCCSF, POLYSCSF, LYMPHSCSF, EOSCSF, PROTEINCSF, GLUCCSF, JCVIRUS, CSFOLI, IGGCSF                      Bone Pathology Markers No results found for: VD25OH, [id number], [id number], [id number], 25OHVITD1, 25OHVITD2, 25OHVITD3, TESTOFREE, TESTOSTERONE                       Coagulation Parameters Lab Results  Component Value Date   INR 1.12 08/13/2015   LABPROT 14.6 08/13/2015   APTT 29 08/13/2015   PLT 189.0 04/28/2018                        Cardiovascular Markers Lab Results  Component Value Date   BNP 318.4 (H) 08/13/2015   CKTOTAL 73 12/31/2011   CKMB 3.4 12/31/2011   TROPONINI 0.68 (HH) 08/14/2015   HGB 15.1 04/28/2018   HCT 44.4 04/28/2018                         CA Markers No results found for: CEA, CA125, LABCA2                      Endocrine Markers Lab Results  Component  Value Date   TSH 2.100 04/08/2017                        Note: Lab results reviewed.  Recent Diagnostic Imaging Results  ECHOCARDIOGRAM COMPLETE                         [name]                        [address], [place] [address]                            [phone]  ------------------------------------------------------------------- Transthoracic Echocardiography  Patient:    [name], [name] MR #:       [id number] Study Date: 01/26/2018 Gender:     M Age:        21 Height:     185.4 cm Weight:     86.2 kg BSA:  2.11 m^2 Pt. Status: Room:   Jetty Duhamel, M.D.  REFERRING    [name], M.D.  SONOGRAPHER  Diamond Nickel  PERFORMING   [facility], Outpatient  ATTENDING    Cherlynn Kaiser  cc:  ------------------------------------------------------------------- LV EF: 50% -   55%  ------------------------------------------------------------------- Indications:      I35.0 Aortic stenosis.  ------------------------------------------------------------------- History:   PMH:  Palpitations.  Dyspnea.  Coronary artery disease. Risk factors:  Hypertension. Diabetes mellitus. Dyslipidemia.  ------------------------------------------------------------------- Study Conclusions  - Left ventricle: The cavity size was normal. Wall thickness was   increased in a pattern of moderate LVH. Systolic function was   normal. The estimated ejection fraction was in the range of 50%   to 55%. There is akinesis of the inferior myocardium. Doppler   parameters are consistent with abnormal left ventricular   relaxation (grade 1 diastolic dysfunction). - Aortic valve: Valve mobility was restricted. There was mild   stenosis. There was trivial regurgitation. - Mitral valve: Calcified annulus. 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 - Pericardium, extracardiac: A trivial pericardial effusion was    identified.  Impressions:  - Akinesis of the basal inferior wall with overall low normal LV   function; mild diastolic dysfunction; moderate LVH; calcified   aortic valve with probable fusion of left and noncoronary cusps;   mild AS (mean gradient 12 mmHg) and trace AI.  ------------------------------------------------------------------- Labs, prior tests, procedures, and surgery: Coronary artery bypass grafting.  ------------------------------------------------------------------- Study data:  Comparison was made to the study of 01/17/2017.  Study status:  Routine.  Procedure:  The patient reported no pain pre or post test. Transthoracic echocardiography. Image quality was adequate.  Study completion:  There were no complications. Transthoracic echocardiography.  M-mode, complete 2D, spectral Doppler, and color Doppler.  Birthdate:  Patient birthdate: 04-Jan-1932.  Age:  Patient is 85 yr old.  Sex:  Gender: male. BMI: 25.1 kg/m^2.  Blood pressure:     156/83  Patient status: Outpatient.  Study date:  Study date: 01/26/2018. Study time: 02:10 PM.  Location:  [facility]  -------------------------------------------------------------------  ------------------------------------------------------------------- Left ventricle:  The cavity size was normal. Wall thickness was increased in a pattern of moderate LVH. Systolic function was normal. The estimated ejection fraction was in the range of 50% to 55%.  Regional wall motion abnormalities:   There is akinesis of the inferior myocardium. Doppler parameters are consistent with abnormal left ventricular relaxation (grade 1 diastolic dysfunction).  ------------------------------------------------------------------- Aortic valve:   Trileaflet. Valve mobility was restricted. Doppler:   There was mild stenosis.   There was trivial regurgitation.    VTI ratio of LVOT to aortic valve: 0.36. Indexed valve area (VTI): 0.54 cm^2/m^2. Peak  velocity ratio of LVOT to aortic valve: 0.35. Indexed valve area (Vmax): 0.52 cm^2/m^2. Mean velocity ratio of LVOT to aortic valve: 0.3. Indexed valve area (Vmean): 0.45 cm^2/m^2.    Mean gradient (S): 12 mm Hg. Peak gradient (S): 22 mm Hg.  ------------------------------------------------------------------- Aorta:  Aortic root: The aortic root was normal in size.  ------------------------------------------------------------------- Mitral valve:   Calcified annulus. Mobility was not restricted. Doppler:  Transvalvular velocity was within the normal range. There was no evidence for stenosis. There was trivial regurgitation. Indexed valve area by pressure half-time: 1.04 cm^2/m^2.  ------------------------------------------------------------------- Left atrium:  The atrium was normal in size.  ------------------------------------------------------------------- Atrial septum:  There was an atrial septal aneurysm.  ------------------------------------------------------------------- Right ventricle:  The cavity size was normal. Systolic function  was normal.  ------------------------------------------------------------------- Pulmonic valve:    Doppler:  Transvalvular velocity was within the normal range. There was no evidence for stenosis.  ------------------------------------------------------------------- Tricuspid valve:   Structurally normal valve.    Doppler: Transvalvular velocity was within the normal range. There was trivial regurgitation.  ------------------------------------------------------------------- Pulmonary artery:   Systolic pressure was within the normal range.   ------------------------------------------------------------------- Right atrium:  The atrium was normal in size.  ------------------------------------------------------------------- Pericardium:  A trivial pericardial effusion was  identified.  ------------------------------------------------------------------- Systemic veins: Inferior vena cava: The vessel was normal in size.  ------------------------------------------------------------------- Measurements   Left ventricle                           Value          Reference  LV ID, ED, PLAX chordal          (L)     41    mm       43 - 52  LV ID, ES, PLAX chordal                  34    mm       23 - 38  LV fx shortening, PLAX chordal   (L)     17    %        >=29  LV PW thickness, ED                      15    mm       ----------  IVS/LV PW ratio, ED                      0.93           <=1.3  Stroke volume, 2D                        55    ml       ----------  Stroke volume/bsa, 2D                    26    ml/m^2   ----------  LV e&', lateral                           4.9   cm/s     ----------  LV E/e&', lateral                         10.49          ----------  LV e&', medial                            4.9   cm/s     ----------  LV E/e&', medial                          10.49          ----------  LV e&', average                           4.9   cm/s     ----------  LV E/e&', average  10.49          ----------    Ventricular septum                       Value          Reference  IVS thickness, ED                        13.96 mm       ----------    LVOT                                     Value          Reference  LVOT ID, S                               20    mm       ----------  LVOT area                                3.14  cm^2     ----------  LVOT peak velocity, S                    81.6  cm/s     ----------  LVOT mean velocity, S                    49    cm/s     ----------  LVOT VTI, S                              17.4  cm       ----------    Aortic valve                             Value          Reference  Aortic valve peak velocity, S            234   cm/s     ----------  Aortic valve mean velocity, S            162   cm/s      ----------  Aortic valve VTI, S                      47.8  cm       ----------  Aortic mean gradient, S                  12    mm Hg    ----------  Aortic peak gradient, S                  22    mm Hg    ----------  VTI ratio, LVOT/AV                       0.36           ----------  Aortic valve area/bsa, VTI               0.54  cm^2/m^2 ----------  Velocity ratio, peak, LVOT/AV  0.35           ----------  Aortic valve area/bsa, peak              0.52  cm^2/m^2 ----------  velocity  Velocity ratio, mean, LVOT/AV            0.3            ----------  Aortic valve area/bsa, mean              0.45  cm^2/m^2 ----------  velocity  Aortic regurg peak velocity              211   cm/s     ----------  Aortic regurg pressure half-time         668   ms       ----------  Aortic regurg peak gradient              18    mm Hg    ----------    Aorta                                    Value          Reference  Aortic root ID, ED                       28    mm       ----------    Left atrium                              Value          Reference  LA ID, A-P, ES                           38    mm       ----------  LA ID/bsa, A-P                           1.8   cm/m^2   <=2.2  LA volume, S                             53.2  ml       ----------  LA volume/bsa, S                         25.2  ml/m^2   ----------  LA volume, ES, 1-p A4C                   43.1  ml       ----------  LA volume/bsa, ES, 1-p A4C               20.4  ml/m^2   ----------  LA volume, ES, 1-p A2C                   64.8  ml       ----------  LA volume/bsa, ES, 1-p A2C               30.7  ml/m^2   ----------    Mitral valve  Value          Reference  Mitral E-wave peak velocity              51.4  cm/s     ----------  Mitral A-wave peak velocity              101   cm/s     ----------  Mitral deceleration time         (H)     342   ms       150 - 230  Mitral E/A ratio, peak                   0.5             ----------  Mitral valve area/bsa, PHT, DP           1.04  cm^2/m^2 ----------    Pulmonary arteries                       Value          Reference  PA pressure, S, DP                       28    mm Hg    <=30    Tricuspid valve                          Value          Reference  Tricuspid regurg peak velocity           251   cm/s     ----------  Tricuspid peak RV-RA gradient            25    mm Hg    ----------    Right atrium                             Value          Reference  RA ID, S-I, ES, A4C              (H)     52    mm       34 - 49  RA area, ES, A4C                         13.7  cm^2     8.3 - 19.5  RA volume, ES, A/L                       27.4  ml       ----------  RA volume/bsa, ES, A/L                   13    ml/m^2   ----------    Systemic veins                           Value          Reference  Estimated CVP                            3     mm Hg    ----------    Right ventricle  Value          Reference  RV pressure, S, DP                       28    mm Hg    <=30  RV s&', lateral, S                        8.7   cm/s     ----------  Legend: (L)  and  (H)  mark values outside specified reference range.  ------------------------------------------------------------------- Prepared and Electronically Authenticated by  Kirk Ruths 2019-10-24T16:51:27  Complexity Note: Imaging results reviewed. Results shared with [name], using Layman's terms.                               Meds   Current Outpatient Medications:  .  aspirin EC 81 MG tablet, Take 81 mg by mouth at bedtime., Disp: , Rfl:  .  Blood Glucose Monitoring Suppl (ONE TOUCH ULTRA 2) w/Device KIT, Use to obtain blood sugar daily. Dx Code E11.40, Disp: 1 each, Rfl: 0 .  carvedilol (COREG) 3.125 MG tablet, TAKE 1 TABLET BY MOUTH TWICE (2) DAILY WITH MEALS, Disp: 180 tablet, Rfl: 3 .  cholecalciferol (VITAMIN D) 1000 units tablet, Take 2,000 Units by mouth daily. , Disp: ,  Rfl:  .  clopidogrel (PLAVIX) 75 MG tablet, TAKE 1 TABLET BY MOUTH ONCE DAILY WITH BREAKFAST, Disp: 90 tablet, Rfl: 1 .  DHA-Vitamin C-Lutein (EYE HEALTH FORMULA PO), Take 1 tablet by mouth daily. , Disp: , Rfl:  .  ferrous sulfate 324 (65 Fe) MG TBEC, Take 1 tablet by mouth daily., Disp: , Rfl:  .  furosemide (LASIX) 40 MG tablet, TAKE 1 OR 2 TABLETS BY MOUTH DAILY, Disp: 60 tablet, Rfl: 11 .  glucose blood (ONE TOUCH ULTRA TEST) test strip, USE TO CHECK BLOOD SUGAR ONCE A DAY Dx Code E11.40, Disp: 100 each, Rfl: 3 .  HYDROcodone-acetaminophen (NORCO) 10-325 MG tablet, Take 1-2 tablets by mouth every 6 (six) hours as needed for severe pain. To fill on or after: 06/22/2018, 07/22/2018, 08/20/2018, Disp: 120 tablet, Rfl: 0 .  lactulose (CHRONULAC) 10 GM/15ML solution, Take 20 g by mouth daily. , Disp: , Rfl:  .  nitroGLYCERIN (NITROSTAT) 0.4 MG SL tablet, DISSOLVE 1 TABLET UNDER TONGUE AS NEEDEDFOR CHEST PAIN. MAY REPEAT 5 MINUTES APART 3 TIMES IF NEEDED. IF NO RELIEF CALL 911, Disp: 25 tablet, Rfl: 1 .  ONETOUCH DELICA LANCETS 94R MISC, 1 each by In Vitro route daily. Dx Code E11.49, Disp: 100 each, Rfl: 3 .  OXYGEN, Inhale into the lungs. Per pt- Uses Oxygen 2 liters at night., Disp: , Rfl:  .  pantoprazole (PROTONIX) 40 MG tablet, TAKE 1 TABLET BY MOUTH TWICE (2) DAILY, Disp: 60 tablet, Rfl: 11  ROS  Constitutional: Denies any fever or chills Gastrointestinal: No reported hemesis, hematochezia, vomiting, or acute GI distress Musculoskeletal: Denies any acute onset joint swelling, redness, loss of ROM, or weakness Neurological: No reported episodes of acute onset apraxia, aphasia, dysarthria, agnosia, amnesia, paralysis, loss of coordination, or loss of consciousness  Allergies  [name] is allergic to doxazosin mesylate and methocarbamol.  [place]  Drug: [name]  reports no history of drug use. Alcohol:  reports no history of alcohol use. Tobacco:  reports that he quit smoking about 55  years ago. His smoking  use included cigarettes. He has a 20.00 pack-year smoking history. He has never used smokeless tobacco. Medical:  has a past medical history of Arthritis, CAD (coronary artery disease), Chronic diastolic CHF (congestive heart failure) ([place]), Chronic kidney disease, stage III (moderate) ([place]), Chronic lower back pain, Colon polyps, Diverticulosis, Dyspnea (2009 since July -Sept), Enlarged prostate, Esophageal stricture, GERD (gastroesophageal reflux disease), Heart murmur, Hiatal hernia, History of kidney stones, History of PFTs, Hyperlipidemia, Hypertension, Interstitial lung disease ([place]), Nausea & vomiting, Overweight (BMI 25.0-29.9), RA (rheumatoid arthritis) ([place]), Seropositive rheumatoid arthritis ([place]), and Type II diabetes mellitus ([place]). Surgical: Mr. Toops  has a past surgical history that includes Total knee arthroplasty (Right, 03/2010); Knee arthroscopy (Right, 2008); Cataract extraction w/ intraocular lens  implant, bilateral (Bilateral); Coronary artery bypass graft (11/1999); Coronary stent placement (02/2012); Shoulder arthroscopy with open rotator cuff repair and distal clavicle acrominectomy (Left, 02/27/2013); Trigger finger release (Left, 02/27/2013); Cholecystectomy open (11/2003); Joint replacement; Esophagogastroduodenoscopy (egd) with esophageal dilation (2010); Cardiac catheterization (08/2004); Cardiac catheterization (12/31/2011); Coronary angioplasty with stent (01/03/2012); Coronary angioplasty with stent (01/14/2014); Cardiac catheterization (2009); left and right heart catheterization with coronary angiogram (12/31/2011); percutaneous coronary stent intervention (pci-s) (12/31/2011); percutaneous coronary stent intervention (pci-s) (N/A, 01/03/2012); Percutaneous coronary intervention-balloon only (01/03/2012); left and right heart catheterization with coronary angiogram (N/A, 01/14/2014); Hand surgery; Cardiac catheterization (N/A, 08/13/2015);  Esophagogastroduodenoscopy (N/A, 03/01/2017); and maloney dilation (03/01/2017). Family: family history includes Alcohol abuse in his sister; COPD in his mother; Colon cancer (age of onset: 28) in his brother; Diabetes in his brother; Heart attack in his father; Heart disease in his father; Stroke in his sister.  Constitutional Exam  General appearance: Well nourished, well developed, and well hydrated. In no apparent acute distress Vitals:   06/21/18 1354  BP: (!) 179/85  Pulse: 63  Resp: 18  Temp: 97.6 F (36.4 C)  TempSrc: Oral  SpO2: 98%  Weight: 196 lb (88.9 kg)  Height: '6\' 1"'$  (1.854 m)   BMI Assessment: Estimated body mass index is 25.86 kg/m as calculated from the following:   Height as of this encounter: '6\' 1"'$  (1.854 m).   Weight as of this encounter: 196 lb (88.9 kg).  BMI interpretation table: BMI level Category Range association with higher incidence of chronic pain  <18 kg/m2 Underweight   18.5-24.9 kg/m2 Ideal body weight   25-29.9 kg/m2 Overweight Increased incidence by 20%  30-34.9 kg/m2 Obese (Class I) Increased incidence by 68%  35-39.9 kg/m2 Severe obesity (Class II) Increased incidence by 136%  >40 kg/m2 Extreme obesity (Class III) Increased incidence by 254%   Patient's current BMI Ideal Body weight  Body mass index is 25.86 kg/m. Ideal body weight: 79.9 kg (176 lb 2.4 oz) Adjusted ideal body weight: 83.5 kg (184 lb 1.4 oz)   BMI Readings from Last 4 Encounters:  06/21/18 25.86 kg/m  06/21/18 26.66 kg/m  04/28/18 26.72 kg/m  03/21/18 26.12 kg/m   Wt Readings from Last 4 Encounters:  06/21/18 196 lb (88.9 kg)  06/21/18 196 lb 9.6 oz (89.2 kg)  04/28/18 197 lb (89.4 kg)  03/21/18 198 lb (89.8 kg)  Psych/Mental status: Alert, oriented x 3 (person, place, & time)       Eyes: PERLA Respiratory: No evidence of acute respiratory distress  Cervical Spine Area Exam  Skin & Axial Inspection: No masses, redness, edema, swelling, or associated skin  lesions Alignment: Symmetrical Functional ROM: Unrestricted ROM      Stability: No instability detected Muscle Tone/Strength: Functionally intact. No obvious neuro-muscular anomalies  detected. Sensory (Neurological): Unimpaired Palpation: No palpable anomalies              Upper Extremity (UE) Exam    Side: Right upper extremity  Side: Left upper extremity  Skin & Extremity Inspection: Skin color, temperature, and hair growth are WNL. No peripheral edema or cyanosis. No masses, redness, swelling, asymmetry, or associated skin lesions. No contractures.  Skin & Extremity Inspection: Skin color, temperature, and hair growth are WNL. No peripheral edema or cyanosis. No masses, redness, swelling, asymmetry, or associated skin lesions. No contractures.  Functional ROM: Unrestricted ROM          Functional ROM: Unrestricted ROM          Muscle Tone/Strength: Functionally intact. No obvious neuro-muscular anomalies detected.  Muscle Tone/Strength: Functionally intact. No obvious neuro-muscular anomalies detected.  Sensory (Neurological): Unimpaired          Sensory (Neurological): Unimpaired          Palpation: No palpable anomalies              Palpation: No palpable anomalies              Provocative Test(s):  Phalen's test: deferred Tinel's test: deferred Apley's scratch test (touch opposite shoulder):  Action 1 (Across chest): deferred Action 2 (Overhead): deferred Action 3 (LB reach): deferred   Provocative Test(s):  Phalen's test: deferred Tinel's test: deferred Apley's scratch test (touch opposite shoulder):  Action 1 (Across chest): deferred Action 2 (Overhead): deferred Action 3 (LB reach): deferred    Thoracic Spine Area Exam  Skin & Axial Inspection: No masses, redness, or swelling Alignment: Symmetrical Functional ROM: Unrestricted ROM Stability: No instability detected Muscle Tone/Strength: Functionally intact. No obvious neuro-muscular anomalies detected. Sensory  (Neurological): Unimpaired Muscle strength & Tone: No palpable anomalies Lumbar Spine Area Exam  Skin & Axial Inspection: No masses, redness, or swelling Alignment: Symmetrical Functional ROM: Unrestricted ROM       Stability: No instability detected Muscle Tone/Strength: Functionally intact. No obvious neuro-muscular anomalies detected. Sensory (Neurological): Unimpaired Palpation: No palpable anomalies       Provocative Tests: Hyperextension/rotation test: deferred today       Lumbar quadrant test (Kemp's test): deferred today       Lateral bending test: deferred today       Patrick's Maneuver: deferred today                   FABER* test: deferred today                   S-I anterior distraction/compression test: deferred today         S-I lateral compression test: deferred today         S-I Thigh-thrust test: deferred today         S-I Gaenslen's test: deferred today         *(Flexion, ABduction and External Rotation)  Gait & Posture Assessment  Ambulation: Unassisted Gait: Relatively normal for age and body habitus Posture: WNL   Lower Extremity Exam    Side: Right lower extremity  Side: Left lower extremity  Stability: No instability observed          Stability: No instability observed          Skin & Extremity Inspection: Skin color, temperature, and hair growth are WNL. No peripheral edema or cyanosis. No masses, redness, swelling, asymmetry, or associated skin lesions. No contractures.  Skin & Extremity Inspection: Skin color, temperature, and hair  growth are WNL. No peripheral edema or cyanosis. No masses, redness, swelling, asymmetry, or associated skin lesions. No contractures.  Functional ROM: Unrestricted ROM                  Functional ROM: Unrestricted ROM                  Muscle Tone/Strength: Functionally intact. No obvious neuro-muscular anomalies detected.  Muscle Tone/Strength: Functionally intact. No obvious neuro-muscular anomalies detected.  Sensory  (Neurological): Neuropathic pain pattern        Sensory (Neurological): Neuropathic pain pattern        DTR: Patellar: deferred today Achilles: deferred today Plantar: deferred today  DTR: Patellar: deferred today Achilles: deferred today Plantar: deferred today  Palpation: No palpable anomalies  Palpation: No palpable anomalies     Assessment   Status Diagnosis  Controlled Controlled Controlled 1. Neuropathic pain   2. Chronic pain of both lower extremities   3. Chronic pain syndrome   4. Long term prescription opiate use   5. Diabetic polyneuropathy associated with type 2 diabetes mellitus ([place])   6. Neuropathy   7. Seropositive rheumatoid arthritis ([place])     General Recommendations: The pain condition that the patient suffers from is best treated with a multidisciplinary approach that involves an increase in physical activity to prevent de-conditioning and worsening of the pain cycle, as well as psychological counseling (formal and/or informal) to address the co-morbid psychological affects of pain. Treatment will often involve judicious use of pain medications and interventional procedures to decrease the pain, allowing the patient to participate in the physical activity that will ultimately produce long-lasting pain reductions. The goal of the multidisciplinary approach is to return the patient to a higher level of overall function and to restore their ability to perform activities of daily living.  85 year old male with a history of coronary artery disease, GERD, interstitial lung disease, rheumatoid arthritis, aortic stenosis, chronic kidney disease stage III who presents with lower extremity neuropathic pain secondary to diabetic polyneuropathy. Patient's diagnosis of diabetes was over 10-15 years ago. He is not on insulin. He describes burning and tingling sensation in his toes. This is worsened over time. This is progressed to the dorsum of his foot. Patient has been  tried on various medications including desipramine, lidocaine cream, gabapentin at a dose of 900 mill grams in the morning, 600 mg in the afternoon, 600 mg at bedtime. This dose had to be reduced secondary to his chronic kidney disease. Patient was also tried on Lyrica 100 mgtwice daily but it was too expensive for the patient to afford. Furthermore Lyrica resulted in leg swelling and further dose escalation was limited by patient's chronic kidney disease stage III. It was not very effective for his neuropathic pain symptoms. Patient also has tried carbamazepine which was not effective for his neuropathic pain. Patient has also had injections in his foot for his pain symptoms which were not effective.   Today, we will refill the patient's Hydrocodone as below.  [place] PMP checked and appropriate.  UDS up-to-date and appropriate.  Will need repeat UDS at next visit for annual medication monitoring.  Reduction in monthly quantity from 180-->120 as patient taking it QID prn most days.  Plan of Care  Pharmacotherapy (Medications Ordered): Meds ordered this encounter  Medications  . DISCONTD: HYDROcodone-acetaminophen (NORCO) 10-325 MG tablet    Sig: Take 1-2 tablets by mouth every 6 (six) hours as needed for severe pain. To fill on or after:  06/22/2018, 07/22/2018, 08/20/2018    Dispense:  120 tablet    Refill:  0    Do not place this medication, or any other prescription from our practice, on "Automatic Refill". Patient may have prescription filled one day early if pharmacy is closed on scheduled refill date.  [name] DISCONTD: HYDROcodone-acetaminophen (NORCO) 10-325 MG tablet    Sig: Take 1-2 tablets by mouth every 6 (six) hours as needed for severe pain. To fill on or after: 06/22/2018, 07/22/2018, 08/20/2018    Dispense:  120 tablet    Refill:  0    Do not place this medication, or any other prescription from our practice, on "Automatic Refill". Patient may have prescription filled one day early if  pharmacy is closed on scheduled refill date.  [name] HYDROcodone-acetaminophen (NORCO) 10-325 MG tablet    Sig: Take 1-2 tablets by mouth every 6 (six) hours as needed for severe pain. To fill on or after: 06/22/2018, 07/22/2018, 08/20/2018    Dispense:  120 tablet    Refill:  0    Do not place this medication, or any other prescription from our practice, on "Automatic Refill". Patient may have prescription filled one day early if pharmacy is closed on scheduled refill date.     Provider-requested follow-up:  Future Appointments  Date Time Provider [place]  07/20/2018 11:00 AM [name], MD [facility]  09/13/2018  1:45 PM Gillis Santa, MD [facility] None  10/27/2018 12:00 PM [name], MD [facility]  05/01/2019 11:30 AM [name], MD [facility]    Primary Care Physician: [name], MD Location: [facility] Note by: Gillis Santa, M.D Date: 06/21/2018; Time: 9:07 AM  There are no Patient Instructions on file for this visit.

## 2018-06-30 ENCOUNTER — Telehealth: Payer: Self-pay

## 2018-06-30 NOTE — Telephone Encounter (Signed)
Received IBC from patient regarding possible pulled muscle in groin.  Contacted patient to get additional information about his concern. Patient states approx. 2 days he was straining while using bathroom. Has large raised area on right side of groin. Denies pain or redness at site. States area is about the size of his palm.   Patient is unable to complete Webex at this time. Patient scheduled for OV 07/04/18 @ 1100

## 2018-06-30 NOTE — Telephone Encounter (Signed)
Could be muscle damage vs hernia Can't do elective surgery now but can make assessment about what is going on at the [facility]

## 2018-07-04 ENCOUNTER — Ambulatory Visit (INDEPENDENT_AMBULATORY_CARE_PROVIDER_SITE_OTHER): Payer: Medicare Other | Admitting: Internal Medicine

## 2018-07-04 ENCOUNTER — Other Ambulatory Visit: Payer: Self-pay

## 2018-07-04 ENCOUNTER — Encounter: Payer: Self-pay | Admitting: Internal Medicine

## 2018-07-04 VITALS — BP 140/88 | HR 72 | Temp 97.4°F | Ht 73.0 in | Wt 196.0 lb

## 2018-07-04 DIAGNOSIS — K4090 Unilateral inguinal hernia, without obstruction or gangrene, not specified as recurrent: Secondary | ICD-10-CM | POA: Diagnosis not present

## 2018-07-04 NOTE — Assessment & Plan Note (Signed)
Small and easily reducible No need for surgery at this point Discussed not kicking things, proper lifting, etc If pain, etc ---will consider options (after COVID)

## 2018-07-04 NOTE — Progress Notes (Signed)
Subjective:    Patient ID: [name], male    DOB: Mar 18, 1932, 85 y.o.   MRN: [id number]  HPI Here due to swelling in his right groin  Had been straining trying to move his bowels About a week ago Noticed a pain and bulge in the right groin  Now not really painful He still feels it  Current Outpatient Medications on File Prior to Visit  Medication Sig Dispense Refill   aspirin EC 81 MG tablet Take 81 mg by mouth at bedtime.     Blood Glucose Monitoring Suppl (ONE TOUCH ULTRA 2) w/Device KIT Use to obtain blood sugar daily. Dx Code E11.40 1 each 0   carvedilol (COREG) 3.125 MG tablet TAKE 1 TABLET BY MOUTH TWICE (2) DAILY WITH MEALS 180 tablet 3   cholecalciferol (VITAMIN D) 1000 units tablet Take 2,000 Units by mouth daily.      clopidogrel (PLAVIX) 75 MG tablet TAKE 1 TABLET BY MOUTH ONCE DAILY WITH BREAKFAST 90 tablet 1   DHA-Vitamin C-Lutein (EYE HEALTH FORMULA PO) Take 1 tablet by mouth daily.      furosemide (LASIX) 40 MG tablet TAKE 1 OR 2 TABLETS BY MOUTH DAILY 60 tablet 11   glucose blood (ONE TOUCH ULTRA TEST) test strip USE TO CHECK BLOOD SUGAR ONCE A DAY Dx Code E11.40 100 each 3   HYDROcodone-acetaminophen (NORCO) 10-325 MG tablet Take 1-2 tablets by mouth every 6 (six) hours as needed for severe pain. To fill on or after: 06/22/2018, 07/22/2018, 08/20/2018 120 tablet 0   nitroGLYCERIN (NITROSTAT) 0.4 MG SL tablet DISSOLVE 1 TABLET UNDER TONGUE AS NEEDEDFOR CHEST PAIN. MAY REPEAT 5 MINUTES APART 3 TIMES IF NEEDED. IF NO RELIEF CALL 911 25 tablet 1   ONETOUCH DELICA LANCETS 85Y MISC 1 each by In Vitro route daily. Dx Code E11.49 100 each 3   OXYGEN Inhale into the lungs. Per pt- Uses Oxygen 2 liters at night.     pantoprazole (PROTONIX) 40 MG tablet TAKE 1 TABLET BY MOUTH TWICE (2) DAILY 60 tablet 11   No current facility-administered medications on file prior to visit.     Allergies  Allergen Reactions   Doxazosin Mesylate Other (See Comments)   dizziness   Methocarbamol Rash    Past Medical History:  Diagnosis Date   Arthritis    osteoarthritis, s/p R TKR, and digits   CAD (coronary artery disease)    a. s/p CABG (2001)  b. s/p DES to RCA and cutting POBA to ostial PDA (2013)   c. s/p DES to SVG to OM2 (01/14/14) d. cath: 08/2015 NSTEMI w/ patent LIMA-LAD and 99% stenosis of SVG-OM w/ DES placed. CTO of SVG-RCA and SVG-D1.    Chronic diastolic CHF (congestive heart failure) ([place])    a) 09/13 ECHO- LVEF 85-02%, grade 1 diastolic dysfunction, mild LA dilatation, atrial septal aneurysm, AV mobility restricted, but no sig AS by doppler; b) 09/04/08 ECHO- LVH, ef 60%, mild AS, c. echo 08/2015: EF perserved of 55-60% with inferolateral HK. Mild AS noted.   Chronic kidney disease, stage III (moderate) ([name])    Chronic lower back pain    Colon polyps    Diverticulosis    Dyspnea 2009 since July -Sept   05/06/08-[facility]-  normal effort, reduced VO2 max 20.5 /65%, reduced at 8.2/ 40%, normal breathing resetvca of 55%, submaximal heart rate response 112/77%, flattened o2 pluse response at peak exercise-12 ml/beat @ 85%, No VQ mismatch abnormalities, All c/w CIRC Limitation   Enlarged  prostate    Esophageal stricture    a. s/p dilation spring 2010   GERD (gastroesophageal reflux disease)    Heart murmur    Hiatal hernia    History of kidney stones    History of PFTs    mixed pattern on spiro. mild restn on lung volumes with near normal DLCO. Pattern can be explained by CABG scar. Fev1 2.2L/73%, ratio 68 (67), TLC 4.7/68%,RV 1.5L/55%,DLCO 79%   Hyperlipidemia    Hypertension    Interstitial lung disease ([name])    NOS   Nausea & vomiting    2018/2019   Overweight (BMI 25.0-29.9)    BMI 29   RA (rheumatoid arthritis) ([place])    [name]   Seropositive rheumatoid arthritis ([place])    Type II diabetes mellitus ([place])     Past Surgical History:  Procedure Laterality Date   CARDIAC CATHETERIZATION  08/2004   CP- no  MI, Cath- small vessell disease    CARDIAC CATHETERIZATION  12/31/2011   80% distal LM, 100% native LAD, LCx and RCA, 30% prox SVG-OM, SVG-D1 normal, 99% distal, 80% ostial SVG-RCA distal to graft, LIMA-LAD normal; LVEF mildly decreased with posterior basal AK    CARDIAC CATHETERIZATION  2009   with patent grafts/notes 12/31/2011   CARDIAC CATHETERIZATION N/A 08/13/2015   Procedure: Left Heart Cath and Cors/Grafts Angiography;  Surgeon: Sherren Mocha, MD;  Location: [place];  Service: Cardiovascular;  Laterality: N/A;   CATARACT EXTRACTION W/ INTRAOCULAR LENS  IMPLANT, BILATERAL Bilateral    CHOLECYSTECTOMY OPEN  11/2003   [name]   CORONARY ANGIOPLASTY WITH STENT PLACEMENT  01/03/2012   Successful DES to SVG-RCA and cutting balloon angioplasty ostial  PDA    CORONARY ANGIOPLASTY WITH STENT PLACEMENT  01/14/2014   "1"   CORONARY ARTERY BYPASS GRAFT  11/1999   CABG X5   CORONARY STENT PLACEMENT  02/2012   1 stent and balloon   ESOPHAGOGASTRODUODENOSCOPY N/A 03/01/2017   Procedure: ESOPHAGOGASTRODUODENOSCOPY (EGD);  Surgeon: Irene Shipper, MD;  Location: [facility];  Service: Endoscopy;  Laterality: N/A;   ESOPHAGOGASTRODUODENOSCOPY (EGD) WITH ESOPHAGEAL DILATION  2010   HAND SURGERY     JOINT REPLACEMENT     KNEE ARTHROSCOPY Right 2008   LEFT AND RIGHT HEART CATHETERIZATION WITH CORONARY ANGIOGRAM  12/31/2011   Procedure: LEFT AND RIGHT HEART CATHETERIZATION WITH CORONARY ANGIOGRAM;  Surgeon: Burnell Blanks, MD;  Location: [facility] CATH LAB;  Service: Cardiovascular;;   LEFT AND RIGHT HEART CATHETERIZATION WITH CORONARY ANGIOGRAM N/A 01/14/2014   Procedure: LEFT AND RIGHT HEART CATHETERIZATION WITH CORONARY ANGIOGRAM;  Surgeon: Peter M Martinique, MD;  Location: [facility] CATH LAB;  Service: Cardiovascular;  Laterality: N/A;   MALONEY DILATION  03/01/2017   Procedure: Venia Minks DILATION;  Surgeon: Irene Shipper, MD;  Location: [facility];  Service: Endoscopy;;    PERCUTANEOUS CORONARY INTERVENTION-BALLOON ONLY  01/03/2012   Procedure: PERCUTANEOUS CORONARY INTERVENTION-BALLOON ONLY;  Surgeon: Peter M Martinique, MD;  Location: [facility] CATH LAB;  Service: Cardiovascular;;   PERCUTANEOUS CORONARY STENT INTERVENTION (PCI-S)  12/31/2011   Procedure: PERCUTANEOUS CORONARY STENT INTERVENTION (PCI-S);  Surgeon: Burnell Blanks, MD;  Location: [facility] CATH LAB;  Service: Cardiovascular;;   PERCUTANEOUS CORONARY STENT INTERVENTION (PCI-S) N/A 01/03/2012   Procedure: PERCUTANEOUS CORONARY STENT INTERVENTION (PCI-S);  Surgeon: Peter M Martinique, MD;  Location: [facility] CATH LAB;  Service: Cardiovascular;  Laterality: N/A;   SHOULDER ARTHROSCOPY WITH OPEN ROTATOR CUFF REPAIR AND DISTAL CLAVICLE ACROMINECTOMY Left 02/27/2013   Procedure: LEFT SHOULDER ARTHROSCOPY  WITH MINI OPEN ROTATOR CUFF REPAIR AND SUBACROMIAL DECOMPRESSION AND DISTAL CLAVICLE RESECTION;  Surgeon: [name], MD;  Location: [place];  Service: Orthopedics;  Laterality: Left;   TOTAL KNEE ARTHROPLASTY Right 03/2010   [name]   TRIGGER FINGER RELEASE Left 02/27/2013   Procedure: RELEASE TRIGGER FINGER/A-1 PULLEY;  Surgeon: [name], MD;  Location: [place];  Service: Orthopedics;  Laterality: Left;    Family History  Problem Relation Age of Onset   COPD Mother    Heart disease Father    Heart attack Father    Diabetes Brother    Colon cancer Brother 72   Alcohol abuse Sister    Stroke Sister     Social History   Socioeconomic History   Marital status: Married    Spouse name: Not on file   Number of children: 3   Years of education: Not on file   Highest education level: Not on file  Occupational History   Occupation: Designer, jewellery: RETIRED    Comment: retired  Scientist, product/process development strain: Not on Training and development officer insecurity:    Worry: Not on file    Inability: Not on Lexicographer needs:    Medical: Not on file    Non-medical: Not on file    Tobacco Use   Smoking status: Former Smoker    Packs/day: 1.00    Years: 20.00    Pack years: 20.00    Types: Cigarettes    Last attempt to quit: 04/06/1963    Years since quitting: 55.2   Smokeless tobacco: Never Used  Substance and Sexual Activity   Alcohol use: No    Alcohol/week: 0.0 standard drinks    Comment: 01/01/2012 "last alcohol ~ 50 yr ago"   Drug use: No   Sexual activity: Not Currently  Lifestyle   Physical activity:    Days per week: Not on file    Minutes per session: Not on file   Stress: Not on file  Relationships   Social connections:    Talks on phone: Not on file    Gets together: Not on file    Attends religious service: Not on file    Active member of club or organization: Not on file    Attends meetings of clubs or organizations: Not on file    Relationship status: Not on file   Intimate partner violence:    Fear of current or ex partner: Not on file    Emotionally abused: Not on file    Physically abused: Not on file    Forced sexual activity: Not on file  Other Topics Concern   Not on file  Social History Narrative   No living will   Requests wife as health care POA-- alternate is daughter Hassan Rowan   Discussed DNR --he requests this (done 08/29/12)   Not sure about feeding tube---but might accept for some time   Patient lives with wife and daughter in a one story home.  Has 3 children.  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   Review of Systems  Bowels are some better---usually twice a day and no straining No fever No N/V Appetite is okay Ongoing severe burning neuropathy in hands and feet---continues with the pain specialist    Objective:   Physical Exam  GI: Soft. There is no abdominal tenderness.  Small reducible hernia in right inguinal area Easily reduces even standing No tenderness  Genitourinary:    Genitourinary Comments: No scrotal extension of the hernia            Assessment & Plan:

## 2018-07-13 ENCOUNTER — Telehealth: Payer: Self-pay

## 2018-07-13 DIAGNOSIS — H35372 Puckering of macula, left eye: Secondary | ICD-10-CM | POA: Diagnosis not present

## 2018-07-13 DIAGNOSIS — H353213 Exudative age-related macular degeneration, right eye, with inactive scar: Secondary | ICD-10-CM | POA: Diagnosis not present

## 2018-07-13 DIAGNOSIS — H353221 Exudative age-related macular degeneration, left eye, with active choroidal neovascularization: Secondary | ICD-10-CM | POA: Diagnosis not present

## 2018-07-13 NOTE — Telephone Encounter (Signed)
   Primary Cardiologist:  [name], MD   Patient contacted.  History reviewed.  No symptoms to suggest any unstable cardiac conditions.  Based on discussion, with current pandemic situation, patient will be postponing this appointment  with a plan for f/u on August 13th at 10am or sooner if feasible/necessary.  If symptoms change, he has been instructed to contact our office.   Jacinta Shoe, [place]  07/13/2018 5:07 PM         .

## 2018-07-17 DIAGNOSIS — R0689 Other abnormalities of breathing: Secondary | ICD-10-CM | POA: Diagnosis not present

## 2018-07-17 DIAGNOSIS — J8410 Pulmonary fibrosis, unspecified: Secondary | ICD-10-CM | POA: Diagnosis not present

## 2018-07-20 ENCOUNTER — Ambulatory Visit: Payer: Medicare Other | Admitting: Cardiovascular Disease

## 2018-08-07 ENCOUNTER — Other Ambulatory Visit: Payer: Self-pay | Admitting: Cardiovascular Disease

## 2018-08-10 ENCOUNTER — Encounter: Payer: Self-pay | Admitting: General Surgery

## 2018-08-14 ENCOUNTER — Encounter: Payer: Self-pay | Admitting: Internal Medicine

## 2018-08-14 ENCOUNTER — Telehealth: Payer: Self-pay

## 2018-08-14 ENCOUNTER — Other Ambulatory Visit: Payer: Self-pay

## 2018-08-14 ENCOUNTER — Ambulatory Visit (INDEPENDENT_AMBULATORY_CARE_PROVIDER_SITE_OTHER): Payer: Medicare Other | Admitting: Internal Medicine

## 2018-08-14 VITALS — Ht 72.0 in | Wt 196.0 lb

## 2018-08-14 DIAGNOSIS — K224 Dyskinesia of esophagus: Secondary | ICD-10-CM | POA: Diagnosis not present

## 2018-08-14 DIAGNOSIS — R1310 Dysphagia, unspecified: Secondary | ICD-10-CM

## 2018-08-14 DIAGNOSIS — K5900 Constipation, unspecified: Secondary | ICD-10-CM | POA: Diagnosis not present

## 2018-08-14 DIAGNOSIS — K219 Gastro-esophageal reflux disease without esophagitis: Secondary | ICD-10-CM | POA: Diagnosis not present

## 2018-08-14 DIAGNOSIS — Z7902 Long term (current) use of antithrombotics/antiplatelets: Secondary | ICD-10-CM

## 2018-08-14 DIAGNOSIS — K222 Esophageal obstruction: Secondary | ICD-10-CM | POA: Diagnosis not present

## 2018-08-14 NOTE — H&P (View-Only) (Signed)
HISTORY OF PRESENT ILLNESS:  [name] is a 85 y.o. male with multiple significant medical problems as listed below including coronary artery disease for which he is on Plavix and interstitial lung disease.  Patient was last seen in my office June 21, 2018 regarding functional constipation, problems with dysphasia secondary to esophageal stricture and dysmotility for which he underwent esophageal dilation and Botox injection therapy at [facility] December 2019, and a history of iron deficiency anemia secondary to small bowel AVMs also worked up [employer].  At the time of his visit his only issue was constipation.  Swelling is better.  He contact the office today and schedules this telehealth medicine visit during the coronavirus pandemic due to recurrence of dysphagia.  Reports that he has noticed recurrence of dysphagia over the past 1 to 2 weeks.  Not as bad as it had previous.  He is wondering if he needs repeat therapy.  In terms of his constipation, better after instituting previous recommendations.  MiraLAX working well.  No new problems.  He did have his Plavix held for 5 days prior to his procedures last year after consultation with his cardiologist.  REVIEW OF SYSTEMS:  All non-GI ROS negative unless otherwise stated in the HPI except for arthritis  Past Medical History:  Diagnosis Date  . Arthritis    osteoarthritis, s/p R TKR, and digits  . CAD (coronary artery disease)    a. s/p CABG (2001)  b. s/p DES to RCA and cutting POBA to ostial PDA (2013)   c. s/p DES to SVG to OM2 (01/14/14) d. cath: 08/2015 NSTEMI w/ patent LIMA-LAD and 99% stenosis of SVG-OM w/ DES placed. CTO of SVG-RCA and SVG-D1.   [name] Chronic diastolic CHF (congestive heart failure) ([place])    a) 09/13 ECHO- LVEF 94-70%, grade 1 diastolic dysfunction, mild LA dilatation, atrial septal aneurysm, AV mobility restricted, but no sig AS by doppler; b) 09/04/08 ECHO- LVH, ef 60%, mild AS, c. echo 08/2015: EF perserved of 55-60% with  inferolateral HK. Mild AS noted.  . Chronic kidney disease, stage III (moderate) ([place])   . Chronic lower back pain   . Colon polyps   . Diverticulosis   . Dyspnea 2009 since July -Sept   05/06/08-CPST-  normal effort, reduced VO2 max 20.5 /65%, reduced at 8.2/ 40%, normal breathing resetvca of 55%, submaximal heart rate response 112/77%, flattened o2 pluse response at peak exercise-12 ml/beat @ 85%, No VQ mismatch abnormalities, All c/w CIRC Limitation  . Enlarged prostate   . Esophageal stricture    a. s/p dilation spring 2010  . GERD (gastroesophageal reflux disease)   . Heart murmur   . Hiatal hernia   . History of kidney stones   . History of PFTs    mixed pattern on spiro. mild restn on lung volumes with near normal DLCO. Pattern can be explained by CABG scar. Fev1 2.2L/73%, ratio 68 (67), TLC 4.7/68%,RV 1.5L/55%,DLCO 79%  . Hyperlipidemia   . Hypertension   . Interstitial lung disease ([name])    NOS  . Nausea & vomiting    2018/2019  . Overweight (BMI 25.0-29.9)    BMI 29  . RA (rheumatoid arthritis) ([name])    [name]  . Seropositive rheumatoid arthritis ([place])   . Type II diabetes mellitus ([place])     Past Surgical History:  Procedure Laterality Date  . CARDIAC CATHETERIZATION  08/2004   CP- no MI, Cath- small vessell disease   . CARDIAC CATHETERIZATION  12/31/2011  80% distal LM, 100% native LAD, LCx and RCA, 30% prox SVG-OM, SVG-D1 normal, 99% distal, 80% ostial SVG-RCA distal to graft, LIMA-LAD normal; LVEF mildly decreased with posterior basal AK   . CARDIAC CATHETERIZATION  2009   with patent grafts/notes 12/31/2011  . CARDIAC CATHETERIZATION N/A 08/13/2015   Procedure: Left Heart Cath and Cors/Grafts Angiography;  Surgeon: Sherren Mocha, MD;  Location: [place] [facility];  Service: Cardiovascular;  Laterality: N/A;  . CATARACT EXTRACTION W/ INTRAOCULAR LENS  IMPLANT, BILATERAL Bilateral   . CHOLECYSTECTOMY OPEN  11/2003   [name]  . CORONARY ANGIOPLASTY WITH STENT  PLACEMENT  01/03/2012   Successful DES to SVG-RCA and cutting balloon angioplasty ostial  PDA   . CORONARY ANGIOPLASTY WITH STENT PLACEMENT  01/14/2014   "1"  . CORONARY ARTERY BYPASS GRAFT  11/1999   CABG X5  . CORONARY STENT PLACEMENT  02/2012   1 stent and balloon  . ESOPHAGOGASTRODUODENOSCOPY N/A 03/01/2017   Procedure: ESOPHAGOGASTRODUODENOSCOPY (EGD);  Surgeon: Irene Shipper, MD;  Location: [facility];  Service: Endoscopy;  Laterality: N/A;  . ESOPHAGOGASTRODUODENOSCOPY (EGD) WITH ESOPHAGEAL DILATION  2010  . HAND SURGERY    . JOINT REPLACEMENT    . KNEE ARTHROSCOPY Right 2008  . LEFT AND RIGHT HEART CATHETERIZATION WITH CORONARY ANGIOGRAM  12/31/2011   Procedure: LEFT AND RIGHT HEART CATHETERIZATION WITH CORONARY ANGIOGRAM;  Surgeon: Burnell Blanks, MD;  Location: [facility] CATH LAB;  Service: Cardiovascular;;  . LEFT AND RIGHT HEART CATHETERIZATION WITH CORONARY ANGIOGRAM N/A 01/14/2014   Procedure: LEFT AND RIGHT HEART CATHETERIZATION WITH CORONARY ANGIOGRAM;  Surgeon: Peter M Martinique, MD;  Location: [facility] CATH LAB;  Service: Cardiovascular;  Laterality: N/A;  . MALONEY DILATION  03/01/2017   Procedure: Venia Minks DILATION;  Surgeon: Irene Shipper, MD;  Location: [facility];  Service: Endoscopy;;  . PERCUTANEOUS CORONARY INTERVENTION-BALLOON ONLY  01/03/2012   Procedure: PERCUTANEOUS CORONARY INTERVENTION-BALLOON ONLY;  Surgeon: Peter M Martinique, MD;  Location: [facility] CATH LAB;  Service: Cardiovascular;;  . PERCUTANEOUS CORONARY STENT INTERVENTION (PCI-S)  12/31/2011   Procedure: PERCUTANEOUS CORONARY STENT INTERVENTION (PCI-S);  Surgeon: Burnell Blanks, MD;  Location: [facility] CATH LAB;  Service: Cardiovascular;;  . PERCUTANEOUS CORONARY STENT INTERVENTION (PCI-S) N/A 01/03/2012   Procedure: PERCUTANEOUS CORONARY STENT INTERVENTION (PCI-S);  Surgeon: Peter M Martinique, MD;  Location: [facility] CATH LAB;  Service: Cardiovascular;  Laterality: N/A;  . SHOULDER ARTHROSCOPY WITH OPEN ROTATOR CUFF REPAIR  AND DISTAL CLAVICLE ACROMINECTOMY Left 02/27/2013   Procedure: LEFT SHOULDER ARTHROSCOPY WITH MINI OPEN ROTATOR CUFF REPAIR AND SUBACROMIAL DECOMPRESSION AND DISTAL CLAVICLE RESECTION;  Surgeon: [name], MD;  Location: [place];  Service: Orthopedics;  Laterality: Left;  . TOTAL KNEE ARTHROPLASTY Right 03/2010   [name]  . TRIGGER FINGER RELEASE Left 02/27/2013   Procedure: RELEASE TRIGGER FINGER/A-1 PULLEY;  Surgeon: [name], MD;  Location: [place];  Service: Orthopedics;  Laterality: Left;    Social History OWENS HARA  reports that he quit smoking about 55 years ago. His smoking use included cigarettes. He has a 20.00 pack-year smoking history. He has never used smokeless tobacco. He reports that he does not drink alcohol or use drugs.  family history includes Alcohol abuse in his sister; COPD in his mother; Colon cancer (age of onset: 89) in his brother; Diabetes in his brother; Heart attack in his father; Heart disease in his father; Stroke in his sister.  Allergies  Allergen Reactions  . Doxazosin Mesylate Other (See Comments)  dizziness  . Methocarbamol Rash       PHYSICAL EXAMINATION: Physical examination with telehealth visit  ASSESSMENT:  1.  Recurrent dysphasia secondary to esophageal dysmotility and esophageal stricture.  He responded nicely to Botox injection therapy and esophageal dilation at [facility] December 2019.  Now with recurrence over the past few weeks 2.  Functional constipation.  Improved with laxative regimen 3.  Iron deficiency anemia secondary to small bowel AVMs.  Resolved on iron replacement 4.  Multiple medical problems including chronic Plavix therapy  PLAN:  1.  Schedule EGD with Botox injection and esophageal dilation at the hospital in 4 weeks (morning hospital block June 9) with monitored anesthesia care.  Patient is HIGH RISK given his age and comorbidities.  Also the need to interrupt Plavix therapy for this therapeutic  procedure.The nature of the procedure, as well as the risks, benefits, and alternatives were carefully and thoroughly reviewed with the patient. Ample time for discussion and questions allowed. The patient understood, was satisfied, and agreed to proceed. 2.  Hold Plavix 5 days prior to the procedure as previously done with the approval of his cardiologist 3.  Continue successful laxative regimen for functional constipation 4.  Continue iron supplement This telehealth medicine visit was initiated patient and consented for by the patient who was in his home while I was in my office during the encounter.  He understands it may be associated professional charge for this service

## 2018-08-14 NOTE — Telephone Encounter (Signed)
Dr. Johnsie Cancel Can you please comment on holding plavix?

## 2018-08-14 NOTE — Telephone Encounter (Signed)
   Primary Cardiologist: [name], MD  Chart reviewed as part of pre-operative protocol coverage. Patient was contacted 08/14/2018 in reference to pre-operative risk assessment for pending surgery as outlined below.  FRANCIS DOENGES was last seen on 01/19/18 by Dr. Johnsie Cancel.  Since that day, KYPTON ELTRINGHAM has done well. He denies any new cardiac complaints, including angina. His mobility is not limited by anginal symptoms.   Per Dr. Johnsie Cancel Patient may hold plavix for 5 days prior to procedure.  Therefore, based on ACC/AHA guidelines, the patient would be at acceptable risk for the planned procedure without further cardiovascular testing.   I will route this recommendation to the requesting party via Epic fax function and remove from pre-op pool.  Please call with questions.  Tami Lin Marayah Higdon, PA 08/14/2018, 3:17 PM

## 2018-08-14 NOTE — Telephone Encounter (Signed)
[facility] Pre-operative Risk Assessment     Request for surgical clearance:     Endoscopy Procedure  What type of surgery is being performed?     Endoscopy with botox injection  When is this surgery scheduled?     09/12/18   What type of clearance is required ?  Pharmacy/ Anticoagulant  Are there any medications that need to be held prior to surgery and how long? Plavix for 5 days  Practice name and name of physician performing surgery?      [facility] [name]  What is your office phone and fax number?      Phone- [phone]  [phone]  Anesthesia type (None, local, MAC, general) ?       MAC

## 2018-08-14 NOTE — Progress Notes (Signed)
HISTORY OF PRESENT ILLNESS:  [name] is a 85 y.o. male with multiple significant medical problems as listed below including coronary artery disease for which he is on Plavix and interstitial lung disease.  Patient was last seen in my office June 21, 2018 regarding functional constipation, problems with dysphasia secondary to esophageal stricture and dysmotility for which he underwent esophageal dilation and Botox injection therapy at [facility] December 2019, and a history of iron deficiency anemia secondary to small bowel AVMs also worked up [employer].  At the time of his visit his only issue was constipation.  Swelling is better.  He contact the office today and schedules this telehealth medicine visit during the coronavirus pandemic due to recurrence of dysphagia.  Reports that he has noticed recurrence of dysphagia over the past 1 to 2 weeks.  Not as bad as it had previous.  He is wondering if he needs repeat therapy.  In terms of his constipation, better after instituting previous recommendations.  MiraLAX working well.  No new problems.  He did have his Plavix held for 5 days prior to his procedures last year after consultation with his cardiologist.  REVIEW OF SYSTEMS:  All non-GI ROS negative unless otherwise stated in the HPI except for arthritis  Past Medical History:  Diagnosis Date  . Arthritis    osteoarthritis, s/p R TKR, and digits  . CAD (coronary artery disease)    a. s/p CABG (2001)  b. s/p DES to RCA and cutting POBA to ostial PDA (2013)   c. s/p DES to SVG to OM2 (01/14/14) d. cath: 08/2015 NSTEMI w/ patent LIMA-LAD and 99% stenosis of SVG-OM w/ DES placed. CTO of SVG-RCA and SVG-D1.   [name] Chronic diastolic CHF (congestive heart failure) ([place])    a) 09/13 ECHO- LVEF 60-63%, grade 1 diastolic dysfunction, mild LA dilatation, atrial septal aneurysm, AV mobility restricted, but no sig AS by doppler; b) 09/04/08 ECHO- LVH, ef 60%, mild AS, c. echo 08/2015: EF perserved of 55-60% with  inferolateral HK. Mild AS noted.  . Chronic kidney disease, stage III (moderate) ([place])   . Chronic lower back pain   . Colon polyps   . Diverticulosis   . Dyspnea 2009 since July -Sept   05/06/08-CPST-  normal effort, reduced VO2 max 20.5 /65%, reduced at 8.2/ 40%, normal breathing resetvca of 55%, submaximal heart rate response 112/77%, flattened o2 pluse response at peak exercise-12 ml/beat @ 85%, No VQ mismatch abnormalities, All c/w CIRC Limitation  . Enlarged prostate   . Esophageal stricture    a. s/p dilation spring 2010  . GERD (gastroesophageal reflux disease)   . Heart murmur   . Hiatal hernia   . History of kidney stones   . History of PFTs    mixed pattern on spiro. mild restn on lung volumes with near normal DLCO. Pattern can be explained by CABG scar. Fev1 2.2L/73%, ratio 68 (67), TLC 4.7/68%,RV 1.5L/55%,DLCO 79%  . Hyperlipidemia   . Hypertension   . Interstitial lung disease ([name])    NOS  . Nausea & vomiting    2018/2019  . Overweight (BMI 25.0-29.9)    BMI 29  . RA (rheumatoid arthritis) ([name])    [name]  . Seropositive rheumatoid arthritis ([place])   . Type II diabetes mellitus ([place])     Past Surgical History:  Procedure Laterality Date  . CARDIAC CATHETERIZATION  08/2004   CP- no MI, Cath- small vessell disease   . CARDIAC CATHETERIZATION  12/31/2011  80% distal LM, 100% native LAD, LCx and RCA, 30% prox SVG-OM, SVG-D1 normal, 99% distal, 80% ostial SVG-RCA distal to graft, LIMA-LAD normal; LVEF mildly decreased with posterior basal AK   . CARDIAC CATHETERIZATION  2009   with patent grafts/notes 12/31/2011  . CARDIAC CATHETERIZATION N/A 08/13/2015   Procedure: Left Heart Cath and Cors/Grafts Angiography;  Surgeon: Sherren Mocha, MD;  Location: [place] [facility];  Service: Cardiovascular;  Laterality: N/A;  . CATARACT EXTRACTION W/ INTRAOCULAR LENS  IMPLANT, BILATERAL Bilateral   . CHOLECYSTECTOMY OPEN  11/2003   [name]  . CORONARY ANGIOPLASTY WITH STENT  PLACEMENT  01/03/2012   Successful DES to SVG-RCA and cutting balloon angioplasty ostial  PDA   . CORONARY ANGIOPLASTY WITH STENT PLACEMENT  01/14/2014   "1"  . CORONARY ARTERY BYPASS GRAFT  11/1999   CABG X5  . CORONARY STENT PLACEMENT  02/2012   1 stent and balloon  . ESOPHAGOGASTRODUODENOSCOPY N/A 03/01/2017   Procedure: ESOPHAGOGASTRODUODENOSCOPY (EGD);  Surgeon: Irene Shipper, MD;  Location: [facility];  Service: Endoscopy;  Laterality: N/A;  . ESOPHAGOGASTRODUODENOSCOPY (EGD) WITH ESOPHAGEAL DILATION  2010  . HAND SURGERY    . JOINT REPLACEMENT    . KNEE ARTHROSCOPY Right 2008  . LEFT AND RIGHT HEART CATHETERIZATION WITH CORONARY ANGIOGRAM  12/31/2011   Procedure: LEFT AND RIGHT HEART CATHETERIZATION WITH CORONARY ANGIOGRAM;  Surgeon: Burnell Blanks, MD;  Location: [facility] CATH LAB;  Service: Cardiovascular;;  . LEFT AND RIGHT HEART CATHETERIZATION WITH CORONARY ANGIOGRAM N/A 01/14/2014   Procedure: LEFT AND RIGHT HEART CATHETERIZATION WITH CORONARY ANGIOGRAM;  Surgeon: Peter M Martinique, MD;  Location: [facility] CATH LAB;  Service: Cardiovascular;  Laterality: N/A;  . MALONEY DILATION  03/01/2017   Procedure: Venia Minks DILATION;  Surgeon: Irene Shipper, MD;  Location: [facility];  Service: Endoscopy;;  . PERCUTANEOUS CORONARY INTERVENTION-BALLOON ONLY  01/03/2012   Procedure: PERCUTANEOUS CORONARY INTERVENTION-BALLOON ONLY;  Surgeon: Peter M Martinique, MD;  Location: [facility] CATH LAB;  Service: Cardiovascular;;  . PERCUTANEOUS CORONARY STENT INTERVENTION (PCI-S)  12/31/2011   Procedure: PERCUTANEOUS CORONARY STENT INTERVENTION (PCI-S);  Surgeon: Burnell Blanks, MD;  Location: [facility] CATH LAB;  Service: Cardiovascular;;  . PERCUTANEOUS CORONARY STENT INTERVENTION (PCI-S) N/A 01/03/2012   Procedure: PERCUTANEOUS CORONARY STENT INTERVENTION (PCI-S);  Surgeon: Peter M Martinique, MD;  Location: [facility] CATH LAB;  Service: Cardiovascular;  Laterality: N/A;  . SHOULDER ARTHROSCOPY WITH OPEN ROTATOR CUFF REPAIR  AND DISTAL CLAVICLE ACROMINECTOMY Left 02/27/2013   Procedure: LEFT SHOULDER ARTHROSCOPY WITH MINI OPEN ROTATOR CUFF REPAIR AND SUBACROMIAL DECOMPRESSION AND DISTAL CLAVICLE RESECTION;  Surgeon: [name], MD;  Location: [place];  Service: Orthopedics;  Laterality: Left;  . TOTAL KNEE ARTHROPLASTY Right 03/2010   [name]  . TRIGGER FINGER RELEASE Left 02/27/2013   Procedure: RELEASE TRIGGER FINGER/A-1 PULLEY;  Surgeon: [name], MD;  Location: [place];  Service: Orthopedics;  Laterality: Left;    Social History TOR TSUDA  reports that he quit smoking about 55 years ago. His smoking use included cigarettes. He has a 20.00 pack-year smoking history. He has never used smokeless tobacco. He reports that he does not drink alcohol or use drugs.  family history includes Alcohol abuse in his sister; COPD in his mother; Colon cancer (age of onset: 66) in his brother; Diabetes in his brother; Heart attack in his father; Heart disease in his father; Stroke in his sister.  Allergies  Allergen Reactions  . Doxazosin Mesylate Other (See Comments)  dizziness  . Methocarbamol Rash       PHYSICAL EXAMINATION: Physical examination with telehealth visit  ASSESSMENT:  1.  Recurrent dysphasia secondary to esophageal dysmotility and esophageal stricture.  He responded nicely to Botox injection therapy and esophageal dilation at [facility] December 2019.  Now with recurrence over the past few weeks 2.  Functional constipation.  Improved with laxative regimen 3.  Iron deficiency anemia secondary to small bowel AVMs.  Resolved on iron replacement 4.  Multiple medical problems including chronic Plavix therapy  PLAN:  1.  Schedule EGD with Botox injection and esophageal dilation at the hospital in 4 weeks (morning hospital block June 9) with monitored anesthesia care.  Patient is HIGH RISK given his age and comorbidities.  Also the need to interrupt Plavix therapy for this therapeutic  procedure.The nature of the procedure, as well as the risks, benefits, and alternatives were carefully and thoroughly reviewed with the patient. Ample time for discussion and questions allowed. The patient understood, was satisfied, and agreed to proceed. 2.  Hold Plavix 5 days prior to the procedure as previously done with the approval of his cardiologist 3.  Continue successful laxative regimen for functional constipation 4.  Continue iron supplement This telehealth medicine visit was initiated patient and consented for by the patient who was in his home while I was in my office during the encounter.  He understands it may be associated professional charge for this service

## 2018-08-14 NOTE — Telephone Encounter (Signed)
Ok to hold plavix 5 days before procedure  

## 2018-08-15 ENCOUNTER — Other Ambulatory Visit: Payer: Self-pay

## 2018-08-15 NOTE — Progress Notes (Signed)
Pt scheduled for previsit 09/04/18@10am , EGD with botox scheduled at [facility] 09/12/18@9am , pt to arrive there at 7:30am. Pt aware of appts. Pt approved to hold plavix for 5 days prior to the procedure.

## 2018-08-16 DIAGNOSIS — J8410 Pulmonary fibrosis, unspecified: Secondary | ICD-10-CM | POA: Diagnosis not present

## 2018-08-16 DIAGNOSIS — R0689 Other abnormalities of breathing: Secondary | ICD-10-CM | POA: Diagnosis not present

## 2018-09-04 ENCOUNTER — Ambulatory Visit (AMBULATORY_SURGERY_CENTER): Payer: Self-pay

## 2018-09-04 ENCOUNTER — Other Ambulatory Visit: Payer: Self-pay

## 2018-09-04 VITALS — Ht 72.0 in | Wt 196.0 lb

## 2018-09-04 DIAGNOSIS — R1310 Dysphagia, unspecified: Secondary | ICD-10-CM

## 2018-09-04 NOTE — Progress Notes (Signed)
Denies allergies to eggs or soy products. Denies complication of anesthesia or sedation. Denies use of weight loss medication. Denies use of O2.   Emmi instructions declined.   Pre-Visit was conducted by phone due to Covid 19. Patient was confused and came to his appointment however we still conducted it by phone while he was in the lobby on the third floor.  Patient was handed his instructions for his procedure. Patient was given an opportunity to ask questions and encouraged to call if he had any questions or concerns.

## 2018-09-07 ENCOUNTER — Other Ambulatory Visit: Payer: Self-pay

## 2018-09-07 ENCOUNTER — Encounter (HOSPITAL_COMMUNITY): Payer: Self-pay | Admitting: *Deleted

## 2018-09-07 NOTE — Progress Notes (Signed)
Spoke with patient regarding Covid testing. Asked patient to go ;to [facility] Friday to get test, explained quarantine for the weekend until procedure.  Patient understands.

## 2018-09-08 ENCOUNTER — Other Ambulatory Visit (HOSPITAL_COMMUNITY)
Admission: RE | Admit: 2018-09-08 | Discharge: 2018-09-08 | Disposition: A | Discharge status: Home / Self Care | Payer: Medicare Other | Source: Ambulatory Visit | Attending: Internal Medicine | Admitting: Internal Medicine

## 2018-09-08 DIAGNOSIS — Z1159 Encounter for screening for other viral diseases: Secondary | ICD-10-CM | POA: Insufficient documentation

## 2018-09-08 DIAGNOSIS — Z01812 Encounter for preprocedural laboratory examination: Secondary | ICD-10-CM | POA: Insufficient documentation

## 2018-09-09 LAB — NOVEL CORONAVIRUS, NAA (HOSP ORDER, SEND-OUT TO REF LAB; TAT 18-24 HRS): SARS-CoV-2, NAA: NOT DETECTED

## 2018-09-12 ENCOUNTER — Ambulatory Visit (HOSPITAL_COMMUNITY)
Admission: RE | Admit: 2018-09-12 | Discharge: 2018-09-12 | Disposition: A | Discharge status: Home / Self Care | Payer: Medicare Other | Attending: Internal Medicine | Admitting: Internal Medicine

## 2018-09-12 ENCOUNTER — Telehealth: Payer: Self-pay | Admitting: *Deleted

## 2018-09-12 ENCOUNTER — Ambulatory Visit (HOSPITAL_COMMUNITY): Payer: Medicare Other | Admitting: Certified Registered"

## 2018-09-12 ENCOUNTER — Encounter (HOSPITAL_COMMUNITY): Payer: Self-pay | Admitting: *Deleted

## 2018-09-12 ENCOUNTER — Encounter (HOSPITAL_COMMUNITY)
Admission: RE | Disposition: A | Discharge status: Home / Self Care | Payer: Self-pay | Source: Home / Self Care | Attending: Internal Medicine

## 2018-09-12 ENCOUNTER — Other Ambulatory Visit: Payer: Self-pay

## 2018-09-12 DIAGNOSIS — Z79899 Other long term (current) drug therapy: Secondary | ICD-10-CM | POA: Insufficient documentation

## 2018-09-12 DIAGNOSIS — Z955 Presence of coronary angioplasty implant and graft: Secondary | ICD-10-CM | POA: Diagnosis not present

## 2018-09-12 DIAGNOSIS — I130 Hypertensive heart and chronic kidney disease with heart failure and stage 1 through stage 4 chronic kidney disease, or unspecified chronic kidney disease: Secondary | ICD-10-CM | POA: Insufficient documentation

## 2018-09-12 DIAGNOSIS — Z951 Presence of aortocoronary bypass graft: Secondary | ICD-10-CM | POA: Diagnosis not present

## 2018-09-12 DIAGNOSIS — Z87442 Personal history of urinary calculi: Secondary | ICD-10-CM | POA: Diagnosis not present

## 2018-09-12 DIAGNOSIS — K5904 Chronic idiopathic constipation: Secondary | ICD-10-CM | POA: Insufficient documentation

## 2018-09-12 DIAGNOSIS — Z87891 Personal history of nicotine dependence: Secondary | ICD-10-CM | POA: Insufficient documentation

## 2018-09-12 DIAGNOSIS — I5032 Chronic diastolic (congestive) heart failure: Secondary | ICD-10-CM | POA: Diagnosis not present

## 2018-09-12 DIAGNOSIS — K224 Dyskinesia of esophagus: Secondary | ICD-10-CM | POA: Insufficient documentation

## 2018-09-12 DIAGNOSIS — Z800 Family history of malignant neoplasm of digestive organs: Secondary | ICD-10-CM | POA: Diagnosis not present

## 2018-09-12 DIAGNOSIS — D508 Other iron deficiency anemias: Secondary | ICD-10-CM | POA: Diagnosis not present

## 2018-09-12 DIAGNOSIS — J849 Interstitial pulmonary disease, unspecified: Secondary | ICD-10-CM | POA: Insufficient documentation

## 2018-09-12 DIAGNOSIS — Z96652 Presence of left artificial knee joint: Secondary | ICD-10-CM | POA: Diagnosis not present

## 2018-09-12 DIAGNOSIS — Z7902 Long term (current) use of antithrombotics/antiplatelets: Secondary | ICD-10-CM | POA: Insufficient documentation

## 2018-09-12 DIAGNOSIS — Z888 Allergy status to other drugs, medicaments and biological substances status: Secondary | ICD-10-CM | POA: Diagnosis not present

## 2018-09-12 DIAGNOSIS — Z8249 Family history of ischemic heart disease and other diseases of the circulatory system: Secondary | ICD-10-CM | POA: Diagnosis not present

## 2018-09-12 DIAGNOSIS — R1310 Dysphagia, unspecified: Secondary | ICD-10-CM

## 2018-09-12 DIAGNOSIS — I252 Old myocardial infarction: Secondary | ICD-10-CM | POA: Insufficient documentation

## 2018-09-12 DIAGNOSIS — E1122 Type 2 diabetes mellitus with diabetic chronic kidney disease: Secondary | ICD-10-CM | POA: Insufficient documentation

## 2018-09-12 DIAGNOSIS — N183 Chronic kidney disease, stage 3 (moderate): Secondary | ICD-10-CM | POA: Insufficient documentation

## 2018-09-12 DIAGNOSIS — I2510 Atherosclerotic heart disease of native coronary artery without angina pectoris: Secondary | ICD-10-CM | POA: Diagnosis not present

## 2018-09-12 DIAGNOSIS — K210 Gastro-esophageal reflux disease with esophagitis: Secondary | ICD-10-CM | POA: Diagnosis not present

## 2018-09-12 DIAGNOSIS — K220 Achalasia of cardia: Secondary | ICD-10-CM

## 2018-09-12 DIAGNOSIS — K222 Esophageal obstruction: Secondary | ICD-10-CM | POA: Diagnosis not present

## 2018-09-12 DIAGNOSIS — K5790 Diverticulosis of intestine, part unspecified, without perforation or abscess without bleeding: Secondary | ICD-10-CM | POA: Diagnosis not present

## 2018-09-12 DIAGNOSIS — M069 Rheumatoid arthritis, unspecified: Secondary | ICD-10-CM | POA: Insufficient documentation

## 2018-09-12 HISTORY — PX: BOTOX INJECTION: SHX5754

## 2018-09-12 HISTORY — PX: ESOPHAGOGASTRODUODENOSCOPY (EGD) WITH PROPOFOL: SHX5813

## 2018-09-12 HISTORY — PX: BALLOON DILATION: SHX5330

## 2018-09-12 SURGERY — ESOPHAGOGASTRODUODENOSCOPY (EGD) WITH PROPOFOL
Anesthesia: Monitor Anesthesia Care

## 2018-09-12 MED ORDER — PROPOFOL 10 MG/ML IV BOLUS
INTRAVENOUS | Status: DC | PRN
  Administered 2018-09-12: 10 mg via INTRAVENOUS

## 2018-09-12 MED ORDER — PROPOFOL 10 MG/ML IV BOLUS
INTRAVENOUS | Status: AC
  Filled 2018-09-12: qty 60

## 2018-09-12 MED ORDER — LIDOCAINE 2% (20 MG/ML) 5 ML SYRINGE
INTRAMUSCULAR | Status: DC | PRN
  Administered 2018-09-12: 60 mg via INTRAVENOUS

## 2018-09-12 MED ORDER — SODIUM CHLORIDE 0.9 % IV SOLN: INTRAVENOUS | Status: DC

## 2018-09-12 MED ORDER — SODIUM CHLORIDE (PF) 0.9 % IJ SOLN
INTRAMUSCULAR | Status: DC | PRN
  Administered 2018-09-12: 4 mL via SUBMUCOSAL

## 2018-09-12 MED ORDER — PROPOFOL 500 MG/50ML IV EMUL
INTRAVENOUS | Status: DC | PRN
  Administered 2018-09-12: 100 ug/kg/min via INTRAVENOUS

## 2018-09-12 MED ORDER — PROMETHAZINE HCL 25 MG/ML IJ SOLN: 6.2500 mg | INTRAMUSCULAR | Status: DC | PRN

## 2018-09-12 MED ORDER — ONABOTULINUMTOXINA 100 UNITS IJ SOLR
INTRAMUSCULAR | Status: AC
  Filled 2018-09-12: qty 100

## 2018-09-12 MED ORDER — SODIUM CHLORIDE (PF) 0.9 % IJ SOLN
INTRAMUSCULAR | Status: AC
  Filled 2018-09-12: qty 10

## 2018-09-12 MED ORDER — LACTATED RINGERS IV SOLN
INTRAVENOUS | Status: AC | PRN
  Administered 2018-09-12: 1000 mL via INTRAVENOUS

## 2018-09-12 SURGICAL SUPPLY — 14 items

## 2018-09-12 NOTE — Anesthesia Procedure Notes (Addendum)
Procedure Name: MAC Date/Time: 09/12/2018 9:16 AM Performed by: Eben Burow, CRNA Pre-anesthesia Checklist: Patient identified, Emergency Drugs available, Suction available, Patient being monitored and Timeout performed Oxygen Delivery Method: Nasal cannula Dental Injury: Teeth and Oropharynx as per pre-operative assessment

## 2018-09-12 NOTE — Telephone Encounter (Signed)
Attempted to call for pre appointment assessment. No answer, voicemail not set up.

## 2018-09-12 NOTE — Anesthesia Preprocedure Evaluation (Addendum)
Anesthesia Evaluation  Patient identified by MRN, date of birth, ID band Patient awake    Reviewed: Allergy & Precautions, NPO status , Patient's Chart, lab work & pertinent test results  Airway Mallampati: II  TM Distance: >3 FB Neck ROM: Full    Dental no notable dental hx.    Pulmonary shortness of breath, former smoker,    Pulmonary exam normal breath sounds clear to auscultation       Cardiovascular hypertension, + CAD, + Cardiac Stents, + CABG and +CHF  + Valvular Problems/Murmurs AS  Rhythm:Regular Rate:Normal + Systolic murmurs    Neuro/Psych negative neurological ROS  negative psych ROS   GI/Hepatic Neg liver ROS, GERD  ,  Endo/Other  diabetes, Type 2  Renal/GU Renal InsufficiencyRenal disease  negative genitourinary   Musculoskeletal  (+) Arthritis , Rheumatoid disorders,    Abdominal   Peds negative pediatric ROS (+)  Hematology negative hematology ROS (+)   Anesthesia Other Findings   Reproductive/Obstetrics negative OB ROS                            Anesthesia Physical Anesthesia Plan  ASA: III  Anesthesia Plan: MAC   Post-op Pain Management:    Induction: Intravenous  PONV Risk Score and Plan: 0  Airway Management Planned: Simple Face Mask  Additional Equipment:   Intra-op Plan:   Post-operative Plan:   Informed Consent: I have reviewed the patients History and Physical, chart, labs and discussed the procedure including the risks, benefits and alternatives for the proposed anesthesia with the patient or authorized representative who has indicated his/her understanding and acceptance.     Dental advisory given  Plan Discussed with: CRNA and Surgeon  Anesthesia Plan Comments:         Anesthesia Quick Evaluation

## 2018-09-12 NOTE — Interval H&P Note (Signed)
History and Physical Interval Note:  09/12/2018 9:17 AM  [name]  has presented today for surgery, with the diagnosis of Dysphagia.  The various methods of treatment have been discussed with the patient and family. After consideration of risks, benefits and other options for treatment, the patient has consented to  Procedure(s): ESOPHAGOGASTRODUODENOSCOPY (EGD) WITH PROPOFOL (N/A) BOTOX INJECTION (N/A) as a surgical intervention and dilation of the esophagus.  The patient's history has been reviewed, patient examined, no change in status, stable for surgery.  I have reviewed the patient's chart and labs.  Questions were answered to the patient's satisfaction.    No interval clinical change since the patient's evaluation as above.  Has been off Plavix for about 6 days.  Evaluated and examined at bedside.   [name]

## 2018-09-12 NOTE — Op Note (Signed)
[facility] Patient Name: [name] Procedure Date: 09/12/2018 MRN: [id number] Attending MD: Docia Chuck. Henrene Pastor , MD Date of Birth: 1931/09/17 CSN: [id number] Age: 85 Admit Type: Outpatient Procedure:                Upper GI endoscopy with submucosal injection of                            Botox; w/ balloon dilation 68mm Indications:              Dysphagia Providers:                Docia Chuck. Henrene Pastor, MD, Carlyn Reichert, RN, William Dalton, Technician Referring MD:              Medicines:                Monitored Anesthesia Care Complications:            No immediate complications. Estimated Blood Loss:     Estimated blood loss: none. Procedure:                Pre-Anesthesia Assessment:                           - Prior to the procedure, a History and Physical                            was performed, and patient medications and                            allergies were reviewed. The patient's tolerance of                            previous anesthesia was also reviewed. The risks                            and benefits of the procedure and the sedation                            options and risks were discussed with the patient.                            All questions were answered, and informed consent                            was obtained. Prior Anticoagulants: The patient has                            taken Plavix (clopidogrel), last dose was 6 days                            prior to procedure. ASA Grade Assessment: III - A  patient with severe systemic disease. After                            reviewing the risks and benefits, the patient was                            deemed in satisfactory condition to undergo the                            procedure.                           After obtaining informed consent, the endoscope was                            passed under direct vision. Throughout the               procedure, the patient's blood pressure, pulse, and                            oxygen saturations were monitored continuously. The                            GIF-H190 ([id number] Olympus gastroscope was                            introduced through the mouth, and advanced to the                            second part of duodenum. The upper GI endoscopy was                            accomplished without difficulty. The patient                            tolerated the procedure well. Scope In: Scope Out: Findings:      The esophagus was somewhat tortuous but otherwise normal. Area was       successfully injected with 100 units botulinum toxin. 25 units in each       of 4 quadrants approximately 1 cm above the mucosal Z line at a 45       degree angle. A TTS dilator was passed through the scope after       completing submucosal injection therapy. Dilation with an 18-19-20 mm       balloon dilator was performed to 20 mm.      The stomach was normal.      The examined duodenum was normal.      The cardia and gastric fundus were normal on retroflexion. Impression:               1. Somewhat tortuous esophagus otherwise normal EGD                            status post Botox injection therapy followed by  balloon dilation therapy. Moderate Sedation:      none Recommendation:           1. Resume previous diet                           2. Continue medications                           3. Resume Plavix today                           4. Please make a follow-up office visit with Dr.                            Henrene Pastor in approximately 4 weeks. Procedure Code(s):        --- Professional ---                           [phone], Esophagogastroduodenoscopy, flexible,                            transoral; with transendoscopic balloon dilation of                            esophagus (less than 30 mm diameter) Diagnosis Code(s):        --- Professional ---                            R13.10, Dysphagia, unspecified CPT copyright 2019 [employer]. All rights reserved. The codes documented in this report are preliminary and upon coder review may  be revised to meet current compliance requirements. Docia Chuck. Henrene Pastor, MD 09/12/2018 9:42:09 AM This report has been signed electronically. Number of Addenda: 0

## 2018-09-12 NOTE — Transfer of Care (Signed)
Immediate Anesthesia Transfer of Care Note  Patient: [name]  Procedure(s) Performed: ESOPHAGOGASTRODUODENOSCOPY (EGD) WITH PROPOFOL (N/A ) BOTOX INJECTION (N/A ) BALLOON DILATION (N/A )  Patient Location: PACU and Endoscopy Unit  Anesthesia Type:MAC  Level of Consciousness: awake and drowsy  Airway & Oxygen Therapy: Patient Spontanous Breathing and Patient connected to nasal cannula oxygen  Post-op Assessment: Report given to RN and Post -op Vital signs reviewed and stable  Post vital signs: Reviewed and stable  Last Vitals:  Vitals Value Taken Time  BP 134/63 09/12/2018  9:40 AM  Temp    Pulse 57 09/12/2018  9:39 AM  Resp 22 09/12/2018  9:40 AM  SpO2 98 % 09/12/2018  9:39 AM  Vitals shown include unvalidated device data.  Last Pain:  Vitals:   09/12/18 0829  TempSrc: Oral  PainSc: 0-No pain         Complications: No apparent anesthesia complications

## 2018-09-12 NOTE — Anesthesia Postprocedure Evaluation (Signed)
Anesthesia Post Note  Patient: [name]  Procedure(s) Performed: ESOPHAGOGASTRODUODENOSCOPY (EGD) WITH PROPOFOL (N/A ) BOTOX INJECTION (N/A ) BALLOON DILATION (N/A )     Patient location during evaluation: PACU Anesthesia Type: MAC Level of consciousness: awake and alert Pain management: pain level controlled Vital Signs Assessment: post-procedure vital signs reviewed and stable Respiratory status: spontaneous breathing, nonlabored ventilation, respiratory function stable and patient connected to nasal cannula oxygen Cardiovascular status: stable and blood pressure returned to baseline Postop Assessment: no apparent nausea or vomiting Anesthetic complications: no    Last Vitals:  Vitals:   09/12/18 0950 09/12/18 0954  BP: (!) 178/71   Pulse: (!) 58 (!) 52  Resp: 19 14  Temp:    SpO2: 96% 97%    Last Pain:  Vitals:   09/12/18 0954  TempSrc:   PainSc: 0-No pain                 Tonjia Parillo S

## 2018-09-12 NOTE — Discharge Instructions (Signed)
YOU HAD AN ENDOSCOPIC PROCEDURE TODAY: Refer to the procedure report and other information in the discharge instructions given to you for any specific questions about what was found during the examination. If this information does not answer your questions, please call [place] office at [phone] to clarify.  ° °YOU SHOULD EXPECT: Some feelings of bloating in the abdomen. Passage of more gas than usual. Walking can help get rid of the air that was put into your GI tract during the procedure and reduce the bloating. If you had a lower endoscopy (such as a colonoscopy or flexible sigmoidoscopy) you may notice spotting of blood in your stool or on the toilet paper. Some abdominal soreness may be present for a day or two, also. ° °DIET: Your first meal following the procedure should be a light meal and then it is ok to progress to your normal diet. A half-sandwich or bowl of soup is an example of a good first meal. Heavy or fried foods are harder to digest and may make you feel nauseous or bloated. Drink plenty of fluids but you should avoid alcoholic beverages for 24 hours. If you had a esophageal dilation, please see attached instructions for diet.   ° °ACTIVITY: Your care partner should take you home directly after the procedure. You should plan to take it easy, moving slowly for the rest of the day. You can resume normal activity the day after the procedure however YOU SHOULD NOT DRIVE, use power tools, machinery or perform tasks that involve climbing or major physical exertion for 24 hours (because of the sedation medicines used during the test).  ° °SYMPTOMS TO REPORT IMMEDIATELY: °A gastroenterologist can be reached at any hour. Please call [phone]  for any of the following symptoms:  °Following lower endoscopy (colonoscopy, flexible sigmoidoscopy) °Excessive amounts of blood in the stool  °Significant tenderness, worsening of abdominal pains  °Swelling of the abdomen that is new, acute  °Fever of 100° or  higher  °Following upper endoscopy (EGD, EUS, ERCP, esophageal dilation) °Vomiting of blood or coffee ground material  °New, significant abdominal pain  °New, significant chest pain or pain under the shoulder blades  °Painful or persistently difficult swallowing  °New shortness of breath  °Black, tarry-looking or red, bloody stools ° °FOLLOW UP:  °If any biopsies were taken you will be contacted by phone or by letter within the next 1-3 weeks. Call [phone]  if you have not heard about the biopsies in 3 weeks.  °Please also call with any specific questions about appointments or follow up tests. ° °

## 2018-09-13 ENCOUNTER — Telehealth: Payer: Self-pay | Admitting: *Deleted

## 2018-09-13 ENCOUNTER — Encounter: Payer: Self-pay | Admitting: Student in an Organized Health Care Education/Training Program

## 2018-09-13 ENCOUNTER — Ambulatory Visit
Payer: Medicare Other | Attending: Student in an Organized Health Care Education/Training Program | Admitting: Student in an Organized Health Care Education/Training Program

## 2018-09-13 DIAGNOSIS — M79605 Pain in left leg: Secondary | ICD-10-CM

## 2018-09-13 DIAGNOSIS — M79604 Pain in right leg: Secondary | ICD-10-CM | POA: Diagnosis not present

## 2018-09-13 DIAGNOSIS — G8929 Other chronic pain: Secondary | ICD-10-CM

## 2018-09-13 DIAGNOSIS — M792 Neuralgia and neuritis, unspecified: Secondary | ICD-10-CM

## 2018-09-13 DIAGNOSIS — G894 Chronic pain syndrome: Secondary | ICD-10-CM

## 2018-09-13 MED ORDER — HYDROCODONE-ACETAMINOPHEN 10-325 MG PO TABS: 1.0000 | ORAL_TABLET | Freq: Four times a day (QID) | ORAL | 0 refills | Status: DC | PRN

## 2018-09-13 MED ORDER — HYDROCODONE-ACETAMINOPHEN 10-325 MG PO TABS: 1.0000 | ORAL_TABLET | Freq: Four times a day (QID) | ORAL | 0 refills | Status: AC | PRN

## 2018-09-13 NOTE — Progress Notes (Signed)
Pain Management Virtual Encounter Note - Virtual Visit via Telephone Telehealth (real-time audio visits between healthcare provider and patient).   Patient's Phone No. & Preferred Pharmacy:  [phone] (home); [phone] (mobile); (Preferred) [phone] No e-mail address on record  [name], [name], [address], [place] [address] Phone: [phone] Fax: [phone]  [facility], [place] [address] [place] [address] Phone: [phone] Fax: [phone]    Pre-screening note:  Our staff contacted [name] and offered him an "in person", "face-to-face" appointment versus a telephone encounter. He indicated preferring the telephone encounter, at this time.   Reason for Virtual Visit: COVID-19*  Social distancing based on CDC and AMA recommendations.   I contacted TYDE LAMISON on 09/13/2018 via telephone.      I clearly identified myself as [name] Santa, MD. I verified that I was speaking with the correct person using two identifiers (Name: [name], and date of birth: 1932/01/31).  Advanced Informed Consent I sought verbal advanced consent from [name] for virtual visit interactions. I informed Mr. Sens of possible security and privacy concerns, risks, and limitations associated with providing "not-in-person" medical evaluation and management services. I also informed Mr. Mcclintock of the availability of "in-person" appointments. Finally, I informed him that there would be a charge for the virtual visit and that he could be  personally, fully or partially, financially responsible for it. [name] expressed understanding and agreed to proceed.   Historic Elements   [name] is a 85 y.o. year old, male patient evaluated today after his last encounter by our practice on 09/12/2018. [name]  has a past medical history of Arthritis,  CAD (coronary artery disease), Cataract, Chronic diastolic CHF (congestive heart failure) ([place]), Chronic kidney disease, stage III (moderate) ([place]), Chronic lower back pain, Colon polyps, Diverticulosis, Dyspnea (2009 since July -Sept), Enlarged prostate, Esophageal stricture, GERD (gastroesophageal reflux disease), Heart murmur, Hiatal hernia, History of kidney stones, History of PFTs, Hyperlipidemia, Hypertension, Interstitial lung disease ([place]), Nausea & vomiting, Osteoporosis, Overweight (BMI 25.0-29.9), Oxygen deficiency, RA (rheumatoid arthritis) ([place]), Seropositive rheumatoid arthritis ([place]), and Type II diabetes mellitus ([place]). He also  has a past surgical history that includes Total knee arthroplasty (Right, 03/2010); Knee arthroscopy (Right, 2008); Cataract extraction w/ intraocular lens  implant, bilateral (Bilateral); Coronary artery bypass graft (11/1999); Coronary stent placement (02/2012); Shoulder arthroscopy with open rotator cuff repair and distal clavicle acrominectomy (Left, 02/27/2013); Trigger finger release (Left, 02/27/2013); Cholecystectomy open (11/2003); Joint replacement; Esophagogastroduodenoscopy (egd) with esophageal dilation (2010); Cardiac catheterization (08/2004); Cardiac catheterization (12/31/2011); Coronary angioplasty with stent (01/03/2012); Coronary angioplasty with stent (01/14/2014); Cardiac catheterization (2009); left and right heart catheterization with coronary angiogram (12/31/2011); percutaneous coronary stent intervention (pci-s) (12/31/2011); percutaneous coronary stent intervention (pci-s) (N/A, 01/03/2012); Percutaneous coronary intervention-balloon only (01/03/2012); left and right heart catheterization with coronary angiogram (N/A, 01/14/2014); Hand surgery; Cardiac catheterization (N/A, 08/13/2015); Esophagogastroduodenoscopy (N/A, 03/01/2017); maloney dilation (03/01/2017); Esophagogastroduodenoscopy (egd) with propofol (N/A, 09/12/2018); Botox injection (N/A, 09/12/2018);  and Balloon dilation (N/A, 09/12/2018). [name] has a current medication list which includes the following prescription(s): aspirin ec, one touch ultra 2, carvedilol, vitamin d, clopidogrel, dha-vitamin c-lutein, furosemide, glucose blood, hydrocodone-acetaminophen, hydrocodone-acetaminophen, hydrocodone-acetaminophen, nitroglycerin, onetouch delica lancets 21J, oxygen-helium, pantoprazole, polyethylene glycol, and senna. He  reports that he quit smoking about 55 years ago. His smoking use included cigarettes. He has a 20.00 pack-year smoking history. He has  never used smokeless tobacco. He reports that he does not drink alcohol or use drugs. [name] is allergic to doxazosin mesylate and methocarbamol.   HPI  Today, he is being contacted for medication management.   No change in medical history.  Patient had upper GI endoscopy with Botox injection and balloon dilatation of lower esophageal sphincter for dysphasia.  Continues to endorse low back and leg pain related to peripheral neuropathy.  We will refill medications as below.  Pharmacotherapy Assessment   08/21/2018  2   06/21/2018  Hydrocodone-Acetamin 10-325 MG  120.00 15 Bi Lat   [id number]   Gib (4800)   0  80.00 MME  Medicare   [name]     Monitoring: Pharmacotherapy: No side-effects or adverse reactions reported. [place] PMP: PDMP reviewed during this encounter.       Compliance: No problems identified. Effectiveness: Clinically acceptable. Plan: Refer to "POC".  Pertinent Labs   SAFETY SCREENING Profile Lab Results  Component Value Date   SARSCOV2NAA NOT DETECTED 09/08/2018   COVIDSOURCE NASOPHARYNGEAL 09/08/2018   STAPHAUREUS (A) 03/23/2010    POSITIVE        The Xpert SA Assay (FDA approved for NASAL specimens only), is one component of a comprehensive surveillance program.  It is not intended to diagnose infection nor to guide or monitor treatment.   MRSAPCR NEGATIVE 03/23/2010   Renal Function Lab Results  Component  Value Date   BUN 32 (H) 04/28/2018   CREATININE 2.24 (H) 04/28/2018   BCR 13 06/24/2017   GFRAA 30 (L) 06/24/2017   GFRNONAA 26 (L) 06/24/2017   Hepatic Function Lab Results  Component Value Date   AST 17 04/28/2018   ALT 14 04/28/2018   ALBUMIN 4.1 04/28/2018   ALBUMIN 4.1 04/28/2018   UDS Summary  Date Value Ref Range Status  10/26/2017 FINAL  Final    Comment:    ==================================================================== TOXASSURE COMP DRUG ANALYSIS,UR ==================================================================== Test                             Result       Flag       Units Drug Present and Declared for Prescription Verification   Hydrocodone                    2548         EXPECTED   ng/mg creat   Hydromorphone                  124          EXPECTED   ng/mg creat   Dihydrocodeine                 251          EXPECTED   ng/mg creat   Norhydrocodone                 1816         EXPECTED   ng/mg creat    Sources of hydrocodone include scheduled prescription    medications. Hydromorphone, dihydrocodeine and norhydrocodone are    expected metabolites of hydrocodone. Hydromorphone and    dihydrocodeine are also available as scheduled prescription    medications.   Acetaminophen                  PRESENT      EXPECTED Drug Absent but Declared for Prescription Verification   Salicylate  Not Detected UNEXPECTED    Aspirin, as indicated in the declared medication list, is not    always detected even when used as directed. ==================================================================== Test                      Result    Flag   Units      Ref Range   Creatinine              87               mg/dL      >=20 ==================================================================== Declared Medications:  The flagging and interpretation on this report are based on the  following declared medications.  Unexpected results may arise from   inaccuracies in the declared medications.  **Note: The testing scope of this panel includes these medications:  Hydrocodone (Norco)  **Note: The testing scope of this panel does not include small to  moderate amounts of these reported medications:  Acetaminophen (Norco)  Aspirin (Aspirin 81)  **Note: The testing scope of this panel does not include following  reported medications:  Carvedilol (Coreg)  Clopidogrel (Plavix)  Furosemide (Lasix)  Iron (Ferrous Sulfate)  Lubiprostone (Amitiza)  Nitroglycerin (Nitrostat)  Ondansetron (Zofran)  Oxygen  Pantoprazole (Protonix)  Vitamin C  Vitamin D ==================================================================== For clinical consultation, please call [phone]. ====================================================================    Note: Above Lab results reviewed.  Recent imaging  ECHOCARDIOGRAM COMPLETE                         [name]                        [address], [place] [address]                            [phone]  ------------------------------------------------------------------- Transthoracic Echocardiography  Patient:    Whitfield, Dulay MR #:       [id number] Study Date: 01/26/2018 Gender:     M Age:        77 Height:     185.4 cm Weight:     86.2 kg BSA:        2.11 m^2 Pt. Status: Room:   Jetty Duhamel, M.D.  REFERRING    [name], M.D.  SONOGRAPHER  Diamond Nickel  PERFORMING   [facility]patient  ATTENDING    Cherlynn Kaiser  cc:  ------------------------------------------------------------------- LV EF: 50% -   55%  ------------------------------------------------------------------- Indications:      I35.0 Aortic stenosis.  ------------------------------------------------------------------- History:   PMH:  Palpitations.  Dyspnea.  Coronary artery disease. Risk factors:  Hypertension. Diabetes mellitus.  Dyslipidemia.  ------------------------------------------------------------------- Study Conclusions  - Left ventricle: The cavity size was normal. Wall thickness was   increased in a pattern of moderate LVH. Systolic function was   normal. The estimated ejection fraction was in the range of 50%   to 55%. There is akinesis of the inferior myocardium. Doppler   parameters are consistent with abnormal left ventricular   relaxation (grade 1 diastolic dysfunction). - Aortic valve: Valve mobility was restricted. There was mild   stenosis. There was trivial regurgitation. - Mitral valve: Calcified annulus. - Atrial septum: There was an atrial septal  aneurysm. - Pericardium, extracardiac: A trivial pericardial effusion was   identified.  Impressions:  - Akinesis of the basal inferior wall with overall low normal LV   function; mild diastolic dysfunction; moderate LVH; calcified   aortic valve with probable fusion of left and noncoronary cusps;   mild AS (mean gradient 12 mmHg) and trace AI.  ------------------------------------------------------------------- Labs, prior tests, procedures, and surgery: Coronary artery bypass grafting.  ------------------------------------------------------------------- Study data:  Comparison was made to the study of 01/17/2017.  Study status:  Routine.  Procedure:  The patient reported no pain pre or post test. Transthoracic echocardiography. Image quality was adequate.  Study completion:  There were no complications. Transthoracic echocardiography.  M-mode, complete 2D, spectral Doppler, and color Doppler.  Birthdate:  Patient birthdate: 1931/12/19.  Age:  Patient is 85 yr old.  Sex:  Gender: male. BMI: 25.1 kg/m^2.  Blood pressure:     156/83  Patient status: Outpatient.  Study date:  Study date: 01/26/2018. Study time: 02:10 PM.  Location:   Site  3  -------------------------------------------------------------------  ------------------------------------------------------------------- Left ventricle:  The cavity size was normal. Wall thickness was increased in a pattern of moderate LVH. Systolic function was normal. The estimated ejection fraction was in the range of 50% to 55%.  Regional wall motion abnormalities:   There is akinesis of the inferior myocardium. Doppler parameters are consistent with abnormal left ventricular relaxation (grade 1 diastolic dysfunction).  ------------------------------------------------------------------- Aortic valve:   Trileaflet. Valve mobility was restricted. Doppler:   There was mild stenosis.   There was trivial regurgitation.    VTI ratio of LVOT to aortic valve: 0.36. Indexed valve area (VTI): 0.54 cm^2/m^2. Peak velocity ratio of LVOT to aortic valve: 0.35. Indexed valve area (Vmax): 0.52 cm^2/m^2. Mean velocity ratio of LVOT to aortic valve: 0.3. Indexed valve area (Vmean): 0.45 cm^2/m^2.    Mean gradient (S): 12 mm Hg. Peak gradient (S): 22 mm Hg.  ------------------------------------------------------------------- Aorta:  Aortic root: The aortic root was normal in size.  ------------------------------------------------------------------- Mitral valve:   Calcified annulus. Mobility was not restricted. Doppler:  Transvalvular velocity was within the normal range. There was no evidence for stenosis. There was trivial regurgitation. Indexed valve area by pressure half-time: 1.04 cm^2/m^2.  ------------------------------------------------------------------- Left atrium:  The atrium was normal in size.  ------------------------------------------------------------------- Atrial septum:  There was an atrial septal aneurysm.  ------------------------------------------------------------------- Right ventricle:  The cavity size was normal. Systolic function  was normal.  ------------------------------------------------------------------- Pulmonic valve:    Doppler:  Transvalvular velocity was within the normal range. There was no evidence for stenosis.  ------------------------------------------------------------------- Tricuspid valve:   Structurally normal valve.    Doppler: Transvalvular velocity was within the normal range. There was trivial regurgitation.  ------------------------------------------------------------------- Pulmonary artery:   Systolic pressure was within the normal range.   ------------------------------------------------------------------- Right atrium:  The atrium was normal in size.  ------------------------------------------------------------------- Pericardium:  A trivial pericardial effusion was identified.  ------------------------------------------------------------------- Systemic veins: Inferior vena cava: The vessel was normal in size.  ------------------------------------------------------------------- Measurements   Left ventricle                           Value          Reference  LV ID, ED, PLAX chordal          (L)     41    mm       43 - 52  LV ID, ES, PLAX chordal  34    mm       23 - 38  LV fx shortening, PLAX chordal   (L)     17    %        >=29  LV PW thickness, ED                      15    mm       ----------  IVS/LV PW ratio, ED                      0.93           <=1.3  Stroke volume, 2D                        55    ml       ----------  Stroke volume/bsa, 2D                    26    ml/m^2   ----------  LV e&', lateral                           4.9   cm/s     ----------  LV E/e&', lateral                         10.49          ----------  LV e&', medial                            4.9   cm/s     ----------  LV E/e&', medial                          10.49          ----------  LV e&', average                           4.9   cm/s     ----------  LV E/e&', average                          10.49          ----------    Ventricular septum                       Value          Reference  IVS thickness, ED                        13.96 mm       ----------    LVOT                                     Value          Reference  LVOT ID, S                               20    mm       ----------  LVOT area  3.14  cm^2     ----------  LVOT peak velocity, S                    81.6  cm/s     ----------  LVOT mean velocity, S                    49    cm/s     ----------  LVOT VTI, S                              17.4  cm       ----------    Aortic valve                             Value          Reference  Aortic valve peak velocity, S            234   cm/s     ----------  Aortic valve mean velocity, S            162   cm/s     ----------  Aortic valve VTI, S                      47.8  cm       ----------  Aortic mean gradient, S                  12    mm Hg    ----------  Aortic peak gradient, S                  22    mm Hg    ----------  VTI ratio, LVOT/AV                       0.36           ----------  Aortic valve area/bsa, VTI               0.54  cm^2/m^2 ----------  Velocity ratio, peak, LVOT/AV            0.35           ----------  Aortic valve area/bsa, peak              0.52  cm^2/m^2 ----------  velocity  Velocity ratio, mean, LVOT/AV            0.3            ----------  Aortic valve area/bsa, mean              0.45  cm^2/m^2 ----------  velocity  Aortic regurg peak velocity              211   cm/s     ----------  Aortic regurg pressure half-time         668   ms       ----------  Aortic regurg peak gradient              18    mm Hg    ----------    Aorta                                    Value  Reference  Aortic root ID, ED                       28    mm       ----------    Left atrium                              Value          Reference  LA ID, A-P, ES                           38    mm       ----------  LA ID/bsa,  A-P                           1.8   cm/m^2   <=2.2  LA volume, S                             53.2  ml       ----------  LA volume/bsa, S                         25.2  ml/m^2   ----------  LA volume, ES, 1-p A4C                   43.1  ml       ----------  LA volume/bsa, ES, 1-p A4C               20.4  ml/m^2   ----------  LA volume, ES, 1-p A2C                   64.8  ml       ----------  LA volume/bsa, ES, 1-p A2C               30.7  ml/m^2   ----------    Mitral valve                             Value          Reference  Mitral E-wave peak velocity              51.4  cm/s     ----------  Mitral A-wave peak velocity              101   cm/s     ----------  Mitral deceleration time         (H)     342   ms       150 - 230  Mitral E/A ratio, peak                   0.5            ----------  Mitral valve area/bsa, PHT, DP           1.04  cm^2/m^2 ----------    Pulmonary arteries                       Value          Reference  PA pressure, S, DP  28    mm Hg    <=30    Tricuspid valve                          Value          Reference  Tricuspid regurg peak velocity           251   cm/s     ----------  Tricuspid peak RV-RA gradient            25    mm Hg    ----------    Right atrium                             Value          Reference  RA ID, S-I, ES, A4C              (H)     52    mm       34 - 49  RA area, ES, A4C                         13.7  cm^2     8.3 - 19.5  RA volume, ES, A/L                       27.4  ml       ----------  RA volume/bsa, ES, A/L                   13    ml/m^2   ----------    Systemic veins                           Value          Reference  Estimated CVP                            3     mm Hg    ----------    Right ventricle                          Value          Reference  RV pressure, S, DP                       28    mm Hg    <=30  RV s&', lateral, S                        8.7   cm/s     ----------  Legend: (L)  and  (H)  mark values  outside specified reference range.  ------------------------------------------------------------------- Prepared and Electronically Authenticated by  Kirk Ruths 2019-10-24T16:51:27  Assessment  The primary encounter diagnosis was Neuropathic pain. Diagnoses of Chronic pain of both lower extremities and Chronic pain syndrome were also pertinent to this visit.  Plan of Care  I am having Vinton C. Cisse maintain his DHA-Vitamin C-Lutein (EYE HEALTH FORMULA PO), aspirin EC, OXYGEN, Vitamin D, pantoprazole, ONE TOUCH ULTRA 2, glucose blood, OneTouch Delica Lancets 67H, nitroGLYCERIN, carvedilol, furosemide, clopidogrel, polyethylene glycol, senna, HYDROcodone-acetaminophen, HYDROcodone-acetaminophen, and HYDROcodone-acetaminophen.  Pharmacotherapy (Medications Ordered): Meds ordered this encounter  Medications  .  DISCONTD: HYDROcodone-acetaminophen (NORCO) 10-325 MG tablet    Sig: Take 1-2 tablets by mouth every 6 (six) hours as needed for up to 30 days for severe pain.    Dispense:  120 tablet    Refill:  0    Do not place this medication, or any other prescription from our practice, on "Automatic Refill". Patient may have prescription filled one day early if pharmacy is closed on scheduled refill date.  [name] DISCONTD: HYDROcodone-acetaminophen (NORCO) 10-325 MG tablet    Sig: Take 1-2 tablets by mouth every 6 (six) hours as needed for up to 30 days for severe pain.    Dispense:  120 tablet    Refill:  0    Do not place this medication, or any other prescription from our practice, on "Automatic Refill". Patient may have prescription filled one day early if pharmacy is closed on scheduled refill date.  [name] DISCONTD: HYDROcodone-acetaminophen (NORCO) 10-325 MG tablet    Sig: Take 1-2 tablets by mouth every 6 (six) hours as needed for up to 30 days for severe pain.    Dispense:  120 tablet    Refill:  0    Do not place this medication, or any other prescription from our practice, on  "Automatic Refill". Patient may have prescription filled one day early if pharmacy is closed on scheduled refill date.  [name] HYDROcodone-acetaminophen (NORCO) 10-325 MG tablet    Sig: Take 1-2 tablets by mouth every 6 (six) hours as needed for up to 30 days for severe pain.    Dispense:  120 tablet    Refill:  0    Do not place this medication, or any other prescription from our practice, on "Automatic Refill". Patient may have prescription filled one day early if pharmacy is closed on scheduled refill date.  [name] HYDROcodone-acetaminophen (NORCO) 10-325 MG tablet    Sig: Take 1-2 tablets by mouth every 6 (six) hours as needed for up to 30 days for severe pain.    Dispense:  120 tablet    Refill:  0    Do not place this medication, or any other prescription from our practice, on "Automatic Refill". Patient may have prescription filled one day early if pharmacy is closed on scheduled refill date.  [name] HYDROcodone-acetaminophen (NORCO) 10-325 MG tablet    Sig: Take 1-2 tablets by mouth every 6 (six) hours as needed for up to 30 days for severe pain.    Dispense:  120 tablet    Refill:  0    Do not place this medication, or any other prescription from our practice, on "Automatic Refill". Patient may have prescription filled one day early if pharmacy is closed on scheduled refill date.   Orders:  No orders of the defined types were placed in this encounter.  Follow-up plan:   Return in about 3 months (around 12/14/2018) for Medication Management, in person.    I discussed the assessment and treatment plan with the patient. The patient was provided an opportunity to ask questions and all were answered. The patient agreed with the plan and demonstrated an understanding of the instructions.  Patient advised to call back or seek an in-person evaluation if the symptoms or condition worsens.  Total duration of non-face-to-face encounter: 25 minutes.  Note by: [name] Santa, MD Date: 09/13/2018; Time: 2:58  PM  Note: This dictation was prepared with Dragon dictation. Any transcriptional errors that may result from this process are unintentional.  Disclaimer:  * Given the special circumstances  of the COVID-19 pandemic, the federal government has announced that [employer] ([employer]) will exercise its enforcement discretion and will not impose penalties on physicians using telehealth in the event of noncompliance with regulatory requirements under the [place] and [place] (HIPAA) in connection with the good faith provision of telehealth during the IWPYK-99 national public health emergency. ([place])

## 2018-09-14 DIAGNOSIS — E119 Type 2 diabetes mellitus without complications: Secondary | ICD-10-CM | POA: Diagnosis not present

## 2018-09-16 DIAGNOSIS — J8410 Pulmonary fibrosis, unspecified: Secondary | ICD-10-CM | POA: Diagnosis not present

## 2018-09-16 DIAGNOSIS — R0689 Other abnormalities of breathing: Secondary | ICD-10-CM | POA: Diagnosis not present

## 2018-10-11 DIAGNOSIS — Z85828 Personal history of other malignant neoplasm of skin: Secondary | ICD-10-CM | POA: Diagnosis not present

## 2018-10-11 DIAGNOSIS — L820 Inflamed seborrheic keratosis: Secondary | ICD-10-CM | POA: Diagnosis not present

## 2018-10-11 DIAGNOSIS — D225 Melanocytic nevi of trunk: Secondary | ICD-10-CM | POA: Diagnosis not present

## 2018-10-11 DIAGNOSIS — L570 Actinic keratosis: Secondary | ICD-10-CM | POA: Diagnosis not present

## 2018-10-11 DIAGNOSIS — D1801 Hemangioma of skin and subcutaneous tissue: Secondary | ICD-10-CM | POA: Diagnosis not present

## 2018-10-16 DIAGNOSIS — J8410 Pulmonary fibrosis, unspecified: Secondary | ICD-10-CM | POA: Diagnosis not present

## 2018-10-16 DIAGNOSIS — R0689 Other abnormalities of breathing: Secondary | ICD-10-CM | POA: Diagnosis not present

## 2018-10-19 DIAGNOSIS — H35372 Puckering of macula, left eye: Secondary | ICD-10-CM | POA: Diagnosis not present

## 2018-10-19 DIAGNOSIS — H353213 Exudative age-related macular degeneration, right eye, with inactive scar: Secondary | ICD-10-CM | POA: Diagnosis not present

## 2018-10-19 DIAGNOSIS — H43813 Vitreous degeneration, bilateral: Secondary | ICD-10-CM | POA: Diagnosis not present

## 2018-10-19 DIAGNOSIS — H353221 Exudative age-related macular degeneration, left eye, with active choroidal neovascularization: Secondary | ICD-10-CM | POA: Diagnosis not present

## 2018-10-27 ENCOUNTER — Other Ambulatory Visit: Payer: Self-pay

## 2018-10-27 ENCOUNTER — Encounter: Payer: Self-pay | Admitting: Internal Medicine

## 2018-10-27 ENCOUNTER — Ambulatory Visit (INDEPENDENT_AMBULATORY_CARE_PROVIDER_SITE_OTHER): Payer: Medicare Other | Admitting: Internal Medicine

## 2018-10-27 VITALS — BP 134/88 | HR 61 | Temp 98.2°F | Ht 72.0 in | Wt 193.0 lb

## 2018-10-27 DIAGNOSIS — J849 Interstitial pulmonary disease, unspecified: Secondary | ICD-10-CM | POA: Diagnosis not present

## 2018-10-27 DIAGNOSIS — G8929 Other chronic pain: Secondary | ICD-10-CM

## 2018-10-27 DIAGNOSIS — E1140 Type 2 diabetes mellitus with diabetic neuropathy, unspecified: Secondary | ICD-10-CM | POA: Diagnosis not present

## 2018-10-27 DIAGNOSIS — M79605 Pain in left leg: Secondary | ICD-10-CM | POA: Diagnosis not present

## 2018-10-27 DIAGNOSIS — I5032 Chronic diastolic (congestive) heart failure: Secondary | ICD-10-CM

## 2018-10-27 DIAGNOSIS — M79604 Pain in right leg: Secondary | ICD-10-CM | POA: Diagnosis not present

## 2018-10-27 LAB — POCT GLYCOSYLATED HEMOGLOBIN (HGB A1C): Hemoglobin A1C: 8.9 % — AB (ref 4.0–5.6)

## 2018-10-27 LAB — HM DIABETES FOOT EXAM

## 2018-10-27 MED ORDER — PANTOPRAZOLE SODIUM 40 MG PO TBEC: DELAYED_RELEASE_TABLET | ORAL | 3 refills | Status: DC

## 2018-10-27 NOTE — Assessment & Plan Note (Signed)
Same crackles on exam No functional changes

## 2018-10-27 NOTE — Assessment & Plan Note (Signed)
Lab Results  Component Value Date   HGBA1C 8.9 (A) 10/27/2018   Control has slipped Under 9% is goal at his age--but asked him to be attentive to eating, etc Closer to 8% is desirable

## 2018-10-27 NOTE — Assessment & Plan Note (Signed)
Compensated. No changes needed. 

## 2018-10-27 NOTE — Patient Outreach (Addendum)
[facility]) Care Management  10/27/2018  [name] Dec 28, 1931 [id number]   Referral received from [facility] team for complex case management.  Unsuccessful outreach attempt. Phone rang without option to leave a voice message. Will send unsuccessful outreach letter and follow-up within 3-4 business days.      [place] [phone]

## 2018-10-27 NOTE — Assessment & Plan Note (Signed)
Neuropathic Continues with the pain specialist but has ongoing pain

## 2018-10-27 NOTE — Progress Notes (Signed)
Subjective:    Patient ID: [name], male    DOB: Oct 16, 1931, 85 y.o.   MRN: [id number]  HPI Here for follow up of diabetes and other chronic health conditions  Had EGD and botux again Seemed better for a couple of weeks but the dysphagia is returning The earlier one at [facility] lasted much longer  Checks sugars once a week High 100's mostly No low sugar reactions Feels his eating is about the same---though not going out to eat much  Chronic neuropathic pain persists Feet burn all the time--not that much relief from the pain pills  Breathing is about the same Gets DOE easily--- even working on riding mower No chest pain No edema No syncope or orthostatic dizziness  Current Outpatient Medications on File Prior to Visit  Medication Sig Dispense Refill  . aspirin EC 81 MG tablet Take 81 mg by mouth at bedtime.    . Blood Glucose Monitoring Suppl (ONE TOUCH ULTRA 2) w/Device KIT Use to obtain blood sugar daily. Dx Code E11.40 1 each 0  . carvedilol (COREG) 3.125 MG tablet TAKE 1 TABLET BY MOUTH TWICE (2) DAILY WITH MEALS (Patient taking differently: Take 3.125 mg by mouth 2 (two) times daily with a meal. ) 180 tablet 3  . Cholecalciferol (VITAMIN D) 50 MCG (2000 UT) CAPS Take 2,000 Units by mouth daily.     . clopidogrel (PLAVIX) 75 MG tablet TAKE 1 TABLET BY MOUTH ONCE A DAY WITH BREAKFAST (Patient taking differently: 75 mg daily. ) 90 tablet 0  . DHA-Vitamin C-Lutein (EYE HEALTH FORMULA PO) Take 1 tablet by mouth daily.     . furosemide (LASIX) 40 MG tablet TAKE 1 OR 2 TABLETS BY MOUTH DAILY (Patient taking differently: Take 40 mg by mouth daily. ) 60 tablet 11  . glucose blood (ONE TOUCH ULTRA TEST) test strip USE TO CHECK BLOOD SUGAR ONCE A DAY Dx Code E11.40 100 each 3  . HYDROcodone-acetaminophen (NORCO) 10-325 MG tablet Take 1-2 tablets by mouth every 6 (six) hours as needed for up to 30 days for severe pain. 120 tablet 0  . [START ON 11/19/2018] HYDROcodone-acetaminophen  (NORCO) 10-325 MG tablet Take 1-2 tablets by mouth every 6 (six) hours as needed for up to 30 days for severe pain. 120 tablet 0  . nitroGLYCERIN (NITROSTAT) 0.4 MG SL tablet DISSOLVE 1 TABLET UNDER TONGUE AS NEEDEDFOR CHEST PAIN. MAY REPEAT 5 MINUTES APART 3 TIMES IF NEEDED. IF NO RELIEF CALL 911 25 tablet 1  . ONETOUCH DELICA LANCETS 90Z MISC 1 each by In Vitro route daily. Dx Code E11.49 100 each 3  . OXYGEN Inhale into the lungs. Per pt- Uses Oxygen 2 liters at night.    . pantoprazole (PROTONIX) 40 MG tablet TAKE 1 TABLET BY MOUTH TWICE (2) DAILY (Patient taking differently: Take 40 mg by mouth 2 (two) times daily. ) 60 tablet 11  . polyethylene glycol (MIRALAX / GLYCOLAX) 17 g packet Take 17 g by mouth daily. Patient takes cap fulls of Miralax daily.    [name] senna (SENOKOT) 8.6 MG TABS tablet Take 2 tablets by mouth at bedtime.     No current facility-administered medications on file prior to visit.     Allergies  Allergen Reactions  . Doxazosin Mesylate Other (See Comments)    dizziness  . Methocarbamol Rash    Past Medical History:  Diagnosis Date  . Arthritis    osteoarthritis, s/p R TKR, and digits  . CAD (coronary artery disease)  a. s/p CABG (2001)  b. s/p DES to RCA and cutting POBA to ostial PDA (2013)   c. s/p DES to SVG to OM2 (01/14/14) d. cath: 08/2015 NSTEMI w/ patent LIMA-LAD and 99% stenosis of SVG-OM w/ DES placed. CTO of SVG-RCA and SVG-D1.   . Cataract   . Chronic diastolic CHF (congestive heart failure) ([name])    a) 09/13 ECHO- LVEF 50-55%, grade 1 diastolic dysfunction, mild LA dilatation, atrial septal aneurysm, AV mobility restricted, but no sig AS by doppler; b) 09/04/08 ECHO- LVH, ef 60%, mild AS, c. echo 08/2015: EF perserved of 55-60% with inferolateral HK. Mild AS noted.  . Chronic kidney disease, stage III (moderate) ([name])   . Chronic lower back pain   . Colon polyps   . Diverticulosis   . Dyspnea 2009 since July -Sept   05/06/08-CPST-  normal effort,  reduced VO2 max 20.5 /65%, reduced at 8.2/ 40%, normal breathing resetvca of 55%, submaximal heart rate response 112/77%, flattened o2 pluse response at peak exercise-12 ml/beat @ 85%, No VQ mismatch abnormalities, All c/w CIRC Limitation  . Enlarged prostate   . Esophageal stricture    a. s/p dilation spring 2010  . GERD (gastroesophageal reflux disease)   . Heart murmur   . Hiatal hernia   . History of kidney stones   . History of PFTs    mixed pattern on spiro. mild restn on lung volumes with near normal DLCO. Pattern can be explained by CABG scar. Fev1 2.2L/73%, ratio 68 (67), TLC 4.7/68%,RV 1.5L/55%,DLCO 79%  . Hyperlipidemia   . Hypertension   . Interstitial lung disease ([name])    NOS  . Nausea & vomiting    2018/2019  . Osteoporosis   . Overweight (BMI 25.0-29.9)    BMI 29  . Oxygen deficiency   . RA (rheumatoid arthritis) ([name])    Dr Devashwar  . Seropositive rheumatoid arthritis ([name])   . Type II diabetes mellitus ([name])    diet controlled    Past Surgical History:  Procedure Laterality Date  . BALLOON DILATION N/A 09/12/2018   Procedure: BALLOON DILATION;  Surgeon: Perry, John N, MD;  Location: [facility];  Service: Endoscopy;  Laterality: N/A;  . BOTOX INJECTION N/A 09/12/2018   Procedure: BOTOX INJECTION;  Surgeon: Perry, John N, MD;  Location: [facility];  Service: Endoscopy;  Laterality: N/A;  . CARDIAC CATHETERIZATION  08/2004   CP- no MI, Cath- small vessell disease   . CARDIAC CATHETERIZATION  12/31/2011   80% distal LM, 100% native LAD, LCx and RCA, 30% prox SVG-OM, SVG-D1 normal, 99% distal, 80% ostial SVG-RCA distal to graft, LIMA-LAD normal; LVEF mildly decreased with posterior basal AK   . CARDIAC CATHETERIZATION  2009   with patent grafts/notes 12/31/2011  . CARDIAC CATHETERIZATION N/A 08/13/2015   Procedure: Left Heart Cath and Cors/Grafts Angiography;  Surgeon: Degen Cooper, MD;  Location: [facility];  Service: Cardiovascular;  Laterality: N/A;  .  CATARACT EXTRACTION W/ INTRAOCULAR LENS  IMPLANT, BILATERAL Bilateral   . CHOLECYSTECTOMY OPEN  11/2003   Jacobs  . CORONARY ANGIOPLASTY WITH STENT PLACEMENT  01/03/2012   Successful DES to SVG-RCA and cutting balloon angioplasty ostial  PDA   . CORONARY ANGIOPLASTY WITH STENT PLACEMENT  01/14/2014   "1"  . CORONARY ARTERY BYPASS GRAFT  11/1999   CABG X5  . CORONARY STENT PLACEMENT  02/2012   1 stent and balloon  . ESOPHAGOGASTRODUODENOSCOPY N/A 03/01/2017   Procedure: ESOPHAGOGASTRODUODENOSCOPY (EGD);  Surgeon: Perry, John   N, MD;  Location: [facility];  Service: Endoscopy;  Laterality: N/A;  . ESOPHAGOGASTRODUODENOSCOPY (EGD) WITH ESOPHAGEAL DILATION  2010  . ESOPHAGOGASTRODUODENOSCOPY (EGD) WITH PROPOFOL N/A 09/12/2018   Procedure: ESOPHAGOGASTRODUODENOSCOPY (EGD) WITH PROPOFOL;  Surgeon: Perry, John N, MD;  Location: [facility];  Service: Endoscopy;  Laterality: N/A;  . HAND SURGERY    . JOINT REPLACEMENT    . KNEE ARTHROSCOPY Right 2008  . LEFT AND RIGHT HEART CATHETERIZATION WITH CORONARY ANGIOGRAM  12/31/2011   Procedure: LEFT AND RIGHT HEART CATHETERIZATION WITH CORONARY ANGIOGRAM;  Surgeon: Christopher D McAlhany, MD;  Location: [facility];  Service: Cardiovascular;;  . LEFT AND RIGHT HEART CATHETERIZATION WITH CORONARY ANGIOGRAM N/A 01/14/2014   Procedure: LEFT AND RIGHT HEART CATHETERIZATION WITH CORONARY ANGIOGRAM;  Surgeon: Peter M Jordan, MD;  Location: [facility];  Service: Cardiovascular;  Laterality: N/A;  . MALONEY DILATION  03/01/2017   Procedure: MALONEY DILATION;  Surgeon: Perry, John N, MD;  Location: [facility];  Service: Endoscopy;;  . PERCUTANEOUS CORONARY INTERVENTION-BALLOON ONLY  01/03/2012   Procedure: PERCUTANEOUS CORONARY INTERVENTION-BALLOON ONLY;  Surgeon: Peter M Jordan, MD;  Location: [facility];  Service: Cardiovascular;;  . PERCUTANEOUS CORONARY STENT INTERVENTION (PCI-S)  12/31/2011   Procedure: PERCUTANEOUS CORONARY STENT INTERVENTION (PCI-S);   Surgeon: Christopher D McAlhany, MD;  Location: [facility];  Service: Cardiovascular;;  . PERCUTANEOUS CORONARY STENT INTERVENTION (PCI-S) N/A 01/03/2012   Procedure: PERCUTANEOUS CORONARY STENT INTERVENTION (PCI-S);  Surgeon: Peter M Jordan, MD;  [facility]: [facility];  Service: Cardiovascular;  Laterality: N/A;  . SHOULDER ARTHROSCOPY WITH OPEN ROTATOR CUFF REPAIR AND DISTAL CLAVICLE ACROMINECTOMY Left 02/27/2013   Procedure: LEFT SHOULDER ARTHROSCOPY WITH MINI OPEN ROTATOR CUFF REPAIR AND SUBACROMIAL DECOMPRESSION AND DISTAL CLAVICLE RESECTION;  Surgeon: Peter W Whitfield, MD;  Location: [facility];  Service: Orthopedics;  Laterality: Left;  . TOTAL KNEE ARTHROPLASTY Right 03/2010   Dr Randall  . TRIGGER FINGER RELEASE Left 02/27/2013   Procedure: RELEASE TRIGGER FINGER/A-1 PULLEY;  Surgeon: Peter W Whitfield, MD;  Location: [facility];  Service: Orthopedics;  Laterality: Left;    Family History  Problem Relation Age of Onset  . COPD Mother   . Heart disease Father   . Heart attack Father   . Stomach cancer Brother   . Diabetes Brother   . Colon cancer Brother 25  . Alcohol abuse Sister   . Stroke Sister   . Rectal cancer Neg Hx     Social History   Socioeconomic History  . Marital status: Married    Spouse name: Not on file  . Number of children: 3  . Years of education: Not on file  . Highest education level: Not on file  Occupational History  . Occupation: lawn mower    Employer: RETIRED    Comment: retired  Social Needs  . Financial resource strain: Not on file  . Food insecurity    Worry: Not on file    Inability: Not on file  . Transportation needs    Medical: Not on file    Non-medical: Not on file  Tobacco Use  . Smoking status: Former Smoker    Packs/day: 1.00    Years: 20.00    Pack years: 20.00    Types: Cigarettes    Quit date: 04/06/1963    Years since quitting: 55.5  . Smokeless tobacco: Never Used  Substance and Sexual Activity  . Alcohol use: No     Alcohol/week: 0.0 standard drinks    Comment:   01/01/2012 "last alcohol ~ 50 yr ago"  . Drug use: No  . Sexual activity: Not Currently  Lifestyle  . Physical activity    Days per week: Not on file    Minutes per session: Not on file  . Stress: Not on file  Relationships  . Social Herbalist on phone: Not on file    Gets together: Not on file    Attends religious service: Not on file    Active member of club or organization: Not on file    Attends meetings of clubs or organizations: Not on file    Relationship status: Not on file  . Intimate partner violence    Fear of current or ex partner: Not on file    Emotionally abused: Not on file    Physically abused: Not on file    Forced sexual activity: Not on file  Other Topics Concern  . Not on file  Social History Narrative   No living will   Requests wife as health care POA-- alternate is daughter Hassan Rowan   Discussed DNR --he requests this (done 08/29/12)   Not sure about feeding tube---but might accept for some time   Patient lives with wife and daughter in a one story home.  Has 3 children.  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   Review of Systems Bowels are better with miralax Appetite is okay Weight fairly stable Sleeps okay. Starts in bed--then gets up and spends the rest of the night in recliner (head elevated)    Objective:   Physical Exam  Constitutional: He appears well-developed. No distress.  Neck: No thyromegaly present.  Cardiovascular: Normal rate, regular rhythm, normal heart sounds and intact distal pulses. Exam reveals no gallop.  No murmur heard. Respiratory: Effort normal. No respiratory distress. He has no wheezes.  No dullness Bilateral dry crackles up ~1/2 way  GI: Soft. There is no abdominal tenderness.  Musculoskeletal:        General: No edema.  Lymphadenopathy:    He has no cervical adenopathy.  Neurological:  Decreased sensation in feet  Skin:  No foot lesions   Psychiatric: He has a normal mood and affect. His behavior is normal.           Assessment & Plan:

## 2018-10-30 ENCOUNTER — Other Ambulatory Visit: Payer: Self-pay | Admitting: Internal Medicine

## 2018-10-30 ENCOUNTER — Other Ambulatory Visit: Payer: Self-pay | Admitting: Cardiovascular Disease

## 2018-11-01 ENCOUNTER — Other Ambulatory Visit: Payer: Self-pay

## 2018-11-01 NOTE — Patient Outreach (Signed)
[facility]) Care Management  11/01/2018  [name] February 03, 1932 [id number]   Successful outreach with [name]. HIPAA identifiers verified. [name] confirmed receipt of the previously mailed [facility] information. He denies a need for services and prefers not to participate in the care management program.  Requested to contact him for monthly "check-in" calls. He is aware that he will not be enrolled in a disease specific program but will remain active with [facility]. Informed that he can be transitioned to a disease specific case management program if his care management needs change. He is agreeable with this plan.  PLAN -Will follow-up next month.   [place] [phone]

## 2018-11-10 ENCOUNTER — Telehealth: Payer: Self-pay

## 2018-11-10 NOTE — Telephone Encounter (Signed)
I called and left patient a message about upcoming appointment with Dr. Johnsie Cancel on 11/16/18. Office visit needs to be switched to a telehealth visit. If not, patient needs to be rescheduled.

## 2018-11-12 NOTE — Progress Notes (Signed)
Virtual Visit via Video Note   This visit type was conducted due to national recommendations for restrictions regarding the COVID-19 Pandemic (e.g. social distancing) in an effort to limit this patient's exposure and mitigate transmission in our community.  Due to his co-morbid illnesses, this patient is at least at moderate risk for complications without adequate follow up.  This format is felt to be most appropriate for this patient at this time.  All issues noted in this document were discussed and addressed.  A limited physical exam was performed with this format.  Please refer to the patient's chart for his consent to telehealth for [facility].   Date:  11/16/2018   ID:  [name], [name] December 26, 1931, MRN [id number]  Patient Location: Home Provider Location: Office  PCP:  [name], MD  Cardiologist:  [name], MD   Electrophysiologist:  None   Evaluation Performed:  Follow-Up Visit  Chief Complaint:  CAD/CABG  History of Present Illness:    [name] is a 85 y.o. male with history of CAD previous CABG DES to SVG RCA and PCI/cutting balloon PDA 2013.  Had DES to SVG OM2 in October 2015 and another DES to SVG OM1/OM2 May 2017.  He has chronic fatigue and dyspnea with ILD, RA.  CRF;s include DM, HTN and HLD. Chronic GERD and GI issues with recent botox injection and dilatation for dysphagia by Dr Henrene Pastor 09/12/18   Last echo done 01/26/18 EF 50-55% with mild AS mean gradient 12 mmHg   No cardiac complaints Still with dysphagia post botox injection by Dr Henrene Pastor In June   The patient does not have symptoms concerning for COVID-19 infection (fever, chills, cough, or new shortness of breath).    Past Medical History:  Diagnosis Date  . Arthritis    osteoarthritis, s/p R TKR, and digits  . CAD (coronary artery disease)    a. s/p CABG (2001)  b. s/p DES to RCA and cutting POBA to ostial PDA (2013)   c. s/p DES to SVG to OM2 (01/14/14) d. cath: 08/2015 NSTEMI w/  patent LIMA-LAD and 99% stenosis of SVG-OM w/ DES placed. CTO of SVG-RCA and SVG-D1.   . Cataract   . Chronic diastolic CHF (congestive heart failure) ([place])    a) 09/13 ECHO- LVEF 30-07%, grade 1 diastolic dysfunction, mild LA dilatation, atrial septal aneurysm, AV mobility restricted, but no sig AS by doppler; b) 09/04/08 ECHO- LVH, ef 60%, mild AS, c. echo 08/2015: EF perserved of 55-60% with inferolateral HK. Mild AS noted.  . Chronic kidney disease, stage III (moderate) ([place])   . Chronic lower back pain   . Colon polyps   . Diverticulosis   . Dyspnea 2009 since July -Sept   05/06/08-CPST-  normal effort, reduced VO2 max 20.5 /65%, reduced at 8.2/ 40%, normal breathing resetvca of 55%, submaximal heart rate response 112/77%, flattened o2 pluse response at peak exercise-12 ml/beat @ 85%, No VQ mismatch abnormalities, All c/w CIRC Limitation  . Enlarged prostate   . Esophageal stricture    a. s/p dilation spring 2010  . GERD (gastroesophageal reflux disease)   . Heart murmur   . Hiatal hernia   . History of kidney stones   . History of PFTs    mixed pattern on spiro. mild restn on lung volumes with near normal DLCO. Pattern can be explained by CABG scar. Fev1 2.2L/73%, ratio 68 (67), TLC 4.7/68%,RV 1.5L/55%,DLCO 79%  . Hyperlipidemia   . Hypertension   .  Interstitial lung disease ([name])    NOS  . Nausea & vomiting    2018/2019  . Osteoporosis   . Overweight (BMI 25.0-29.9)    BMI 29  . Oxygen deficiency   . RA (rheumatoid arthritis) ([name])    [name]  . Seropositive rheumatoid arthritis ([place])   . Type II diabetes mellitus ([place])    diet controlled   Past Surgical History:  Procedure Laterality Date  . BALLOON DILATION N/A 09/12/2018   Procedure: BALLOON DILATION;  Surgeon: Irene Shipper, MD;  Location[facility];  Service: Endoscopy;  Laterality: N/A;  . BOTOX INJECTION N/A 09/12/2018   Procedure: BOTOX INJECTION;  Surgeon: Irene Shipper, MD;  Location: [facility];  Service:  Endoscopy;  Laterality: N/A;  . CARDIAC CATHETERIZATION  08/2004   CP- no MI, Cath- small vessell disease   . CARDIAC CATHETERIZATION  12/31/2011   80% distal LM, 100% native LAD, LCx and RCA, 30% prox SVG-OM, SVG-D1 normal, 99% distal, 80% ostial SVG-RCA distal to graft, LIMA-LAD normal; LVEF mildly decreased with posterior basal AK   . CARDIAC CATHETERIZATION  2009   with patent grafts/notes 12/31/2011  . CARDIAC CATHETERIZATION N/A 08/13/2015   Procedure: Left Heart Cath and Cors/Grafts Angiography;  Surgeon: Sherren Mocha, MD;  Location: [place] CV LAB;  Service: Cardiovascular;  Laterality: N/A;  . CATARACT EXTRACTION W/ INTRAOCULAR LENS  IMPLANT, BILATERAL Bilateral   . CHOLECYSTECTOMY OPEN  11/2003   [name]  . CORONARY ANGIOPLASTY WITH STENT PLACEMENT  01/03/2012   Successful DES to SVG-RCA and cutting balloon angioplasty ostial  PDA   . CORONARY ANGIOPLASTY WITH STENT PLACEMENT  01/14/2014   "1"  . CORONARY ARTERY BYPASS GRAFT  11/1999   CABG X5  . CORONARY STENT PLACEMENT  02/2012   1 stent and balloon  . ESOPHAGOGASTRODUODENOSCOPY N/A 03/01/2017   Procedure: ESOPHAGOGASTRODUODENOSCOPY (EGD);  Surgeon: Irene Shipper, MD;  Location: [facility];  Service: Endoscopy;  Laterality: N/A;  . ESOPHAGOGASTRODUODENOSCOPY (EGD) WITH ESOPHAGEAL DILATION  2010  . ESOPHAGOGASTRODUODENOSCOPY (EGD) WITH PROPOFOL N/A 09/12/2018   Procedure: ESOPHAGOGASTRODUODENOSCOPY (EGD) WITH PROPOFOL;  Surgeon: Irene Shipper, MD;  Location: [facility];  Service: Endoscopy;  Laterality: N/A;  . HAND SURGERY    . JOINT REPLACEMENT    . KNEE ARTHROSCOPY Right 2008  . LEFT AND RIGHT HEART CATHETERIZATION WITH CORONARY ANGIOGRAM  12/31/2011   Procedure: LEFT AND RIGHT HEART CATHETERIZATION WITH CORONARY ANGIOGRAM;  Surgeon: Burnell Blanks, MD;  Location: [facility] CATH LAB;  Service: Cardiovascular;;  . LEFT AND RIGHT HEART CATHETERIZATION WITH CORONARY ANGIOGRAM N/A 01/14/2014   Procedure: LEFT AND RIGHT HEART  CATHETERIZATION WITH CORONARY ANGIOGRAM;  Surgeon: Darcella Shiffman M Martinique, MD;  Location: [facility] CATH LAB;  Service: Cardiovascular;  Laterality: N/A;  . MALONEY DILATION  03/01/2017   Procedure: Venia Minks DILATION;  Surgeon: Irene Shipper, MD;  Location: [facility];  Service: Endoscopy;;  . PERCUTANEOUS CORONARY INTERVENTION-BALLOON ONLY  01/03/2012   Procedure: PERCUTANEOUS CORONARY INTERVENTION-BALLOON ONLY;  Surgeon: Janee Ureste M Martinique, MD;  Location: [facility] CATH LAB;  Service: Cardiovascular;;  . PERCUTANEOUS CORONARY STENT INTERVENTION (PCI-S)  12/31/2011   Procedure: PERCUTANEOUS CORONARY STENT INTERVENTION (PCI-S);  Surgeon: Burnell Blanks, MD;  Location: [facility] CATH LAB;  Service: Cardiovascular;;  . PERCUTANEOUS CORONARY STENT INTERVENTION (PCI-S) N/A 01/03/2012   Procedure: PERCUTANEOUS CORONARY STENT INTERVENTION (PCI-S);  Surgeon: Chayah Mckee M Martinique, MD;  Location[facility] CATH LAB;  Service: Cardiovascular;  Laterality: N/A;  . SHOULDER ARTHROSCOPY WITH OPEN ROTATOR CUFF REPAIR  AND DISTAL CLAVICLE ACROMINECTOMY Left 02/27/2013   Procedure: LEFT SHOULDER ARTHROSCOPY WITH MINI OPEN ROTATOR CUFF REPAIR AND SUBACROMIAL DECOMPRESSION AND DISTAL CLAVICLE RESECTION;  Surgeon: [name], MD;  Location: [place];  Service: Orthopedics;  Laterality: Left;  . TOTAL KNEE ARTHROPLASTY Right 03/2010   [name]  . TRIGGER FINGER RELEASE Left 02/27/2013   Procedure: RELEASE TRIGGER FINGER/A-1 PULLEY;  Surgeon: [name], MD;  Location: [place];  Service: Orthopedics;  Laterality: Left;     Current Meds  Medication Sig  . aspirin EC 81 MG tablet Take 81 mg by mouth at bedtime.  . Blood Glucose Monitoring Suppl (ONE TOUCH ULTRA 2) w/Device KIT Use to obtain blood sugar daily. Dx Code E11.40  . carvedilol (COREG) 3.125 MG tablet TAKE 1 TABLET BY MOUTH TWICE DAILY WITH MEALS  . Cholecalciferol (VITAMIN D) 50 MCG (2000 UT) CAPS Take 2,000 Units by mouth daily.   . clopidogrel (PLAVIX) 75 MG tablet TAKE 1 TABLET BY  MOUTH ONCE A DAY WITH BREAKFAST.  [name] DHA-Vitamin C-Lutein (EYE HEALTH FORMULA PO) Take 1 tablet by mouth daily.   . furosemide (LASIX) 40 MG tablet Take 40 mg by mouth daily.  [name] glucose blood (ONE TOUCH ULTRA TEST) test strip USE TO CHECK BLOOD SUGAR ONCE A DAY Dx Code E11.40  . HYDROcodone-acetaminophen (NORCO) 10-325 MG tablet Take 1-2 tablets by mouth every 6 (six) hours as needed for up to 30 days for severe pain.  . nitroGLYCERIN (NITROSTAT) 0.4 MG SL tablet DISSOLVE 1 TABLET UNDER TONGUE AS NEEDEDFOR CHEST PAIN. MAY REPEAT 5 MINUTES APART 3 TIMES IF NEEDED. IF NO RELIEF CALL 911  . ONETOUCH DELICA LANCETS 17C MISC 1 each by In Vitro route daily. Dx Code E11.49  . OXYGEN Inhale into the lungs. Per pt- Uses Oxygen 2 liters at night.  . pantoprazole (PROTONIX) 40 MG tablet TAKE 1 TABLET BY MOUTH TWICE (2) DAILY  . polyethylene glycol (MIRALAX / GLYCOLAX) 17 g packet Take 17 g by mouth daily. Patient takes cap fulls of Miralax daily.     Allergies:   Doxazosin mesylate and Methocarbamol   Social History   Tobacco Use  . Smoking status: Former Smoker    Packs/day: 1.00    Years: 20.00    Pack years: 20.00    Types: Cigarettes    Quit date: 04/06/1963    Years since quitting: 55.6  . Smokeless tobacco: Never Used  Substance Use Topics  . Alcohol use: No    Alcohol/week: 0.0 standard drinks    Comment: 01/01/2012 "last alcohol ~ 50 yr ago"  . Drug use: No     Family Hx: The patient's family history includes Alcohol abuse in his sister; COPD in his mother; Colon cancer (age of onset: 33) in his brother; Diabetes in his brother; Heart attack in his father; Heart disease in his father; Stomach cancer in his brother; Stroke in his sister. There is no history of Rectal cancer.  ROS:   Please see the history of present illness.     All other systems reviewed and are negative.   Prior CV studies:   The following studies were reviewed today:  Echo 01/26/18 Cath 08/13/15  Labs/Other  Tests and Data Reviewed:    EKG:   SR PVC T inversions 3,F   Recent Labs: 04/28/2018: ALT 14; BUN 32; Creatinine, Ser 2.24; Hemoglobin 15.1; Platelets 189.0; Potassium 4.8; Sodium 136   Recent Lipid Panel Lab Results  Component Value Date/Time  CHOL 138 10/17/2017 11:51 AM   TRIG 233.0 (H) 10/17/2017 11:51 AM   HDL 34.40 (L) 10/17/2017 11:51 AM   CHOLHDL 4 10/17/2017 11:51 AM   LDLCALC 43 10/14/2016 02:29 PM   LDLDIRECT 73.0 10/17/2017 11:51 AM    Wt Readings from Last 3 Encounters:  11/16/18 193 lb (87.5 kg)  10/27/18 193 lb (87.5 kg)  09/12/18 196 lb (88.9 kg)     Objective:    Vital Signs:  Ht _0  (1.854 m)   Wt 193 lb (87.5 kg)   BMI 25.46 kg/m    Telephone no exam   ASSESSMENT & PLAN:    1. CAD:  Distant CABG with multiple interventions to SVG RCA and OM Last cath 08/13/15 occluded SVG RCA and D1 patent LIMA and stenting to SVG OM Has left to right collaterals to RCA Continue medical RX   2. ILD:  Confirmed by CT 2018 with bronchiectasis chronic dyspnea f/u pulmonary  3. GI:  Post botox injection and dilatation esophagus June 2020 f/u Henrene Pastor still with issues with dysphagia  4. HTN:  Well controlled.  Continue current medications and low sodium Dash type diet.   5. DM:  Discussed low carb diet.  Target hemoglobin A1c is 6.5 or less.  Continue current medications. 6. Baseline Cr around 2.2 f/u nephrology   COVID-19 Education: The signs and symptoms of COVID-19 were discussed with the patient and how to seek care for testing (follow up with PCP or arrange E-visit).  The importance of social distancing was discussed today.  Time:   Today, I have spent 30 minutes with the patient with telehealth technology discussing the above problems.     Medication Adjustments/Labs and Tests Ordered: Current medicines are reviewed at length with the patient today.  Concerns regarding medicines are outlined above.   Tests Ordered: No orders of the defined types were placed in  this encounter.   Medication Changes: No orders of the defined types were placed in this encounter.   Follow Up:  Virtual Visit in 6 months  Signed, [name], MD  11/16/2018 10:07 AM    [place]

## 2018-11-13 ENCOUNTER — Telehealth: Payer: Self-pay

## 2018-11-13 NOTE — Telephone Encounter (Signed)
YOUR CARDIOLOGY TEAM HAS ARRANGED FOR AN E-VISIT FOR YOUR APPOINTMENT - PLEASE REVIEW IMPORTANT INFORMATION BELOW SEVERAL DAYS PRIOR TO YOUR APPOINTMENT  Due to the recent COVID-19 pandemic, we are transitioning in-person office visits to tele-medicine visits in an effort to decrease unnecessary exposure to our patients, their families, and staff. These visits are billed to your insurance just like a normal visit is. We also encourage you to sign up for MyChart if you have not already done so. You will need a smartphone if possible. For patients that do not have this, we can still complete the visit using a regular telephone but do prefer a smartphone to enable video when possible. You may have a family member that lives with you that can help. If possible, we also ask that you have a blood pressure cuff and scale at home to measure your blood pressure, heart rate and weight prior to your scheduled appointment. Patients with clinical needs that need an in-person evaluation and testing will still be able to come to the office if absolutely necessary. If you have any questions, feel free to call our office.     YOUR PROVIDER WILL BE USING THE FOLLOWING PLATFORM TO COMPLETE YOUR VISIT: Phone Call  . IF USING MYCHART - How to Download the MyChart App to Your SmartPhone   - If Apple, go to [employer] and type in MyChart in the search bar and download the app. If Android, ask patient to go to [employer] and type in [place] in the search bar and download the app. The app is free but as with any other app downloads, your phone may require you to verify saved payment information or Apple/Android password.  - You will need to then log into the app with your MyChart username and password, and select [facility] as your healthcare provider to link the account.  - When it is time for your visit, go to the MyChart app, find appointments, and click Begin Video Visit. Be sure to Select Allow for your device to  access the Microphone and Camera for your visit. You will then be connected, and your provider will be with you shortly.  **If you have any issues connecting or need assistance, please contact MyChart service desk [phone])**  **If using a computer, in order to ensure the best quality for your visit, you will need to use either of the following Internet Browsers: Insurance underwriter or [employer]**  . IF USING DOXIMITY or DOXY.ME - The staff will give you instructions on receiving your link to join the meeting the day of your visit.      2-3 DAYS BEFORE YOUR APPOINTMENT  You will receive a telephone call from one of our [place] team members - your caller ID may say "Unknown caller." If this is a video visit, we will walk you through how to get the video launched on your phone. We will remind you check your blood pressure, heart rate and weight prior to your scheduled appointment. If you have an Apple Watch or Kardia, please upload any pertinent ECG strips the day before or morning of your appointment to [place]. Our staff will also make sure you have reviewed the consent and agree to move forward with your scheduled tele-health visit.     THE DAY OF YOUR APPOINTMENT  Approximately 15 minutes prior to your scheduled appointment, you will receive a telephone call from one of [place] team - your caller ID may say "Unknown caller."  Our staff will confirm medications, vital signs for the day and any symptoms you may be experiencing. Please have this information available prior to the time of visit start. It may also be helpful for you to have a pad of paper and pen handy for any instructions given during your visit. They will also walk you through joining the smartphone meeting if this is a video visit.    CONSENT FOR TELE-HEALTH VISIT - PLEASE REVIEW  I hereby voluntarily request, consent and authorize [facility] and its employed or contracted physicians, physician  assistants, nurse practitioners or other licensed health care professionals (the Practitioner), to provide me with telemedicine health care services (the "Services") as deemed necessary by the treating Practitioner. I acknowledge and consent to receive the Services by the Practitioner via telemedicine. I understand that the telemedicine visit will involve communicating with the Practitioner through live audiovisual communication technology and the disclosure of certain medical information by electronic transmission. I acknowledge that I have been given the opportunity to request an in-person assessment or other available alternative prior to the telemedicine visit and am voluntarily participating in the telemedicine visit.  I understand that I have the right to withhold or withdraw my consent to the use of telemedicine in the course of my care at any time, without affecting my right to future care or treatment, and that the Practitioner or I may terminate the telemedicine visit at any time. I understand that I have the right to inspect all information obtained and/or recorded in the course of the telemedicine visit and may receive copies of available information for a reasonable fee.  I understand that some of the potential risks of receiving the Services via telemedicine include:  . Delay or interruption in medical evaluation due to technological equipment failure or disruption; . Information transmitted may not be sufficient (e.g. poor resolution of images) to allow for appropriate medical decision making by the Practitioner; and/or  . In rare instances, security protocols could fail, causing a breach of personal health information.  Furthermore, I acknowledge that it is my responsibility to provide information about my medical history, conditions and care that is complete and accurate to the best of my ability. I acknowledge that Practitioner's advice, recommendations, and/or decision may be based on  factors not within their control, such as incomplete or inaccurate data provided by me or distortions of diagnostic images or specimens that may result from electronic transmissions. I understand that the practice of medicine is not an exact science and that Practitioner makes no warranties or guarantees regarding treatment outcomes. I acknowledge that I will receive a copy of this consent concurrently upon execution via email to the email address I last provided but may also request a printed copy by calling the office of [facility].    I understand that my insurance will be billed for this visit.   I have read or had this consent read to me. . I understand the contents of this consent, which adequately explains the benefits and risks of the Services being provided via telemedicine.  . I have been provided ample opportunity to ask questions regarding this consent and the Services and have had my questions answered to my satisfaction. . I give my informed consent for the services to be provided through the use of telemedicine in my medical care  By participating in this telemedicine visit I agree to the above.  

## 2018-11-14 NOTE — Telephone Encounter (Signed)
This encounter was created in error - please disregard.

## 2018-11-16 ENCOUNTER — Telehealth: Payer: Self-pay | Admitting: Internal Medicine

## 2018-11-16 ENCOUNTER — Telehealth (INDEPENDENT_AMBULATORY_CARE_PROVIDER_SITE_OTHER): Payer: Medicare Other | Admitting: Cardiovascular Disease

## 2018-11-16 ENCOUNTER — Other Ambulatory Visit: Payer: Self-pay

## 2018-11-16 ENCOUNTER — Encounter: Payer: Self-pay | Admitting: Cardiovascular Disease

## 2018-11-16 VITALS — Ht 73.0 in | Wt 193.0 lb

## 2018-11-16 DIAGNOSIS — I2510 Atherosclerotic heart disease of native coronary artery without angina pectoris: Secondary | ICD-10-CM

## 2018-11-16 DIAGNOSIS — R0689 Other abnormalities of breathing: Secondary | ICD-10-CM | POA: Diagnosis not present

## 2018-11-16 DIAGNOSIS — J8410 Pulmonary fibrosis, unspecified: Secondary | ICD-10-CM | POA: Diagnosis not present

## 2018-11-16 NOTE — Patient Instructions (Signed)
Medication Instructions:  Your physician recommends that you continue on your current medications as directed. Please refer to the Current Medication list given to you today.  If you need a refill on your cardiac medications before your next appointment, please call your pharmacy.   Lab work: None If you have labs (blood work) drawn today and your tests are completely normal, you will receive your results only by: . MyChart Message (if you have MyChart) OR . A paper copy in the mail If you have any lab test that is abnormal or we need to change your treatment, we will call you to review the results.  Testing/Procedures: None  Follow-Up: At [facility], you and your health needs are our priority.  As part of our continuing mission to provide you with exceptional heart care, we have created designated Provider Care Teams.  These Care Teams include your primary Cardiologist (physician) and Advanced Practice Providers (APPs -  Physician Assistants and Nurse Practitioners) who all work together to provide you with the care you need, when you need it. You will need a follow up appointment in 6 months.  Please call our office 2 months in advance to schedule this appointment.  You may see [name], MD or one of the following Advanced Practice Providers on your designated Care Team:   [name], NP [name], NP . [name], NP  Any Other Special Instructions Will Be Listed Below (If Applicable).    

## 2018-11-16 NOTE — Telephone Encounter (Signed)
I do not know how much it will help, but we can set up EGD with Botox and balloon dilation of the esophagus during 1 of my routine hospital slots.  Thanks

## 2018-11-16 NOTE — Telephone Encounter (Signed)
Pt reported that he is still experiencing dysphagia.  Please advise.

## 2018-11-16 NOTE — Telephone Encounter (Signed)
Pt states he had an egd with dilation and botox in May to help with his dysphagia in May. Pt states that he is having the same issue again of food not going down when he tries to swallow it. Reports this has been happening for 3-4 weeks. Pt wants to know if he needs to have another procedure. Please advise.

## 2018-11-17 ENCOUNTER — Other Ambulatory Visit: Payer: Self-pay

## 2018-11-17 DIAGNOSIS — K220 Achalasia of cardia: Secondary | ICD-10-CM

## 2018-11-17 DIAGNOSIS — R1310 Dysphagia, unspecified: Secondary | ICD-10-CM

## 2018-11-17 NOTE — Telephone Encounter (Signed)
I can add him to your schedule at [facility] on 8/24@8 :15am if that will work for you. Please advise.

## 2018-11-17 NOTE — Telephone Encounter (Signed)
Sure need to hold plavix for 7 days and diabetic meds that day. Thanks

## 2018-11-17 NOTE — Telephone Encounter (Signed)
Pt scheduled for covid testing at [facility] 11/23/18, pt knows to quarantine post test. Pt to stop Plavix starting on 8/17 and hold diabetic med on 8/24, pt to be NPO after midnight for procedure. Pt to arrive at hospital at [place] 8/24, EGD with balloon dil and botox injection at [facility] at 8:15am. Pt aware.

## 2018-11-20 NOTE — Telephone Encounter (Signed)
Virtual visit completed on 11/16/2018. 

## 2018-11-22 ENCOUNTER — Other Ambulatory Visit: Payer: Self-pay

## 2018-11-22 NOTE — Patient Outreach (Signed)
[facility]) Care Management  11/22/2018  [name] May 14, 1931 [id number]   Brief outreach with [name]. He reports feeling well and denies concerns since our conversation last month. He still declines need for care management services but agreeable to routine "check-in" calls.   He denies urgent concerns or immediate needs. Agreeable to contacting RNCM with questions or if his health status changes.  PLAN -Will outreach next month.  [place] Care Management [phone]

## 2018-11-23 ENCOUNTER — Other Ambulatory Visit (HOSPITAL_COMMUNITY)
Admission: RE | Admit: 2018-11-23 | Discharge: 2018-11-23 | Disposition: A | Discharge status: Home / Self Care | Payer: Medicare Other | Source: Ambulatory Visit | Attending: Internal Medicine | Admitting: Internal Medicine

## 2018-11-23 ENCOUNTER — Encounter (HOSPITAL_COMMUNITY): Payer: Self-pay

## 2018-11-23 DIAGNOSIS — Z01812 Encounter for preprocedural laboratory examination: Secondary | ICD-10-CM | POA: Diagnosis not present

## 2018-11-23 DIAGNOSIS — Z20828 Contact with and (suspected) exposure to other viral communicable diseases: Secondary | ICD-10-CM | POA: Insufficient documentation

## 2018-11-23 LAB — SARS CORONAVIRUS 2 (TAT 6-24 HRS): SARS Coronavirus 2: NEGATIVE

## 2018-11-24 ENCOUNTER — Encounter (HOSPITAL_COMMUNITY): Payer: Self-pay

## 2018-11-24 ENCOUNTER — Other Ambulatory Visit: Payer: Self-pay

## 2018-11-24 NOTE — Progress Notes (Addendum)
The following are in epic: Last office visit note Dr. Johnsie Cancel 11/16/2018 EKG 05/20/2017 ECHO 01/26/2018 Hgb A1C (8.9) 10/27/2018  Last dose of Plavix was on 11/20/2018 per patient

## 2018-11-24 NOTE — Progress Notes (Signed)
Needs CBG per anesthesia protocol

## 2018-11-24 NOTE — Anesthesia Preprocedure Evaluation (Addendum)
Anesthesia Evaluation  Patient identified by MRN, date of birth, ID band Patient awake    Reviewed: Allergy & Precautions, NPO status , Patient's Chart, lab work & pertinent test results, reviewed documented beta blocker date and time   History of Anesthesia Complications Negative for: history of anesthetic complications  Airway Mallampati: II  TM Distance: >3 FB Neck ROM: Full    Dental  (+) Partial Upper, Missing,    Pulmonary sleep apnea and Oxygen sleep apnea , former smoker,  Interstitial lung disease   Pulmonary exam normal        Cardiovascular hypertension, Pt. on medications and Pt. on home beta blockers + CAD (on Plavix), + Cardiac Stents (2013, 2015, 2017), + CABG (2001) and +CHF  Normal cardiovascular exam  TTE 01/2018: moderate LVH, EF 50-55%, akinesis of inferior myocardium, grade 1 diastolic dysfunction, mild AS, atrial septal aneurysm    Neuro/Psych negative neurological ROS     GI/Hepatic Neg liver ROS, hiatal hernia, GERD  Medicated and Controlled,Achalasia   Endo/Other  diabetes, Type 2  Renal/GU Renal InsufficiencyRenal disease     Musculoskeletal  (+) Arthritis , Rheumatoid disorders,    Abdominal   Peds  Hematology negative hematology ROS (+)   Anesthesia Other Findings Day of surgery medications reviewed with the patient.  Reproductive/Obstetrics                            Anesthesia Physical Anesthesia Plan  ASA: III  Anesthesia Plan: MAC   Post-op Pain Management:    Induction:   PONV Risk Score and Plan: 1 and Treatment may vary due to age or medical condition and Propofol infusion  Airway Management Planned: Natural Airway and Nasal Cannula  Additional Equipment: None  Intra-op Plan:   Post-operative Plan:   Informed Consent: I have reviewed the patients History and Physical, chart, labs and discussed the procedure including the risks, benefits and  alternatives for the proposed anesthesia with the patient or authorized representative who has indicated his/her understanding and acceptance.     Dental advisory given  Plan Discussed with: CRNA  Anesthesia Plan Comments:        Anesthesia Quick Evaluation

## 2018-11-24 NOTE — Progress Notes (Signed)
SPOKE W/  [place]     SCREENING SYMPTOMS OF COVID 19:   COUGH--NO  RUNNY NOSE--- NO  SORE THROAT---NO  NASAL CONGESTION----NO  SNEEZING----NO  SHORTNESS OF BREATH---NO  DIFFICULTY BREATHING---NO  TEMP >100.0 -----NO  UNEXPLAINED BODY ACHES------NO  CHILLS -------- NO  HEADACHES ---------NO  LOSS OF SMELL/ TASTE --------NO    HAVE YOU OR ANY FAMILY MEMBER TRAVELLED PAST 14 DAYS OUT OF THE   COUNTY---NO STATE----NO COUNTRY----NO  HAVE YOU OR ANY FAMILY MEMBER BEEN EXPOSED TO ANYONE WITH COVID 19? NO

## 2018-11-27 ENCOUNTER — Other Ambulatory Visit: Payer: Self-pay

## 2018-11-27 ENCOUNTER — Ambulatory Visit (HOSPITAL_COMMUNITY): Payer: Medicare Other | Admitting: Anesthesiology

## 2018-11-27 ENCOUNTER — Encounter (HOSPITAL_COMMUNITY): Payer: Self-pay | Admitting: Anesthesiology

## 2018-11-27 ENCOUNTER — Encounter (HOSPITAL_COMMUNITY)
Admission: RE | Disposition: A | Discharge status: Home / Self Care | Payer: Self-pay | Source: Home / Self Care | Attending: Internal Medicine

## 2018-11-27 ENCOUNTER — Ambulatory Visit (HOSPITAL_COMMUNITY)
Admission: RE | Admit: 2018-11-27 | Discharge: 2018-11-27 | Disposition: A | Discharge status: Home / Self Care | Payer: Medicare Other | Attending: Internal Medicine | Admitting: Internal Medicine

## 2018-11-27 DIAGNOSIS — I130 Hypertensive heart and chronic kidney disease with heart failure and stage 1 through stage 4 chronic kidney disease, or unspecified chronic kidney disease: Secondary | ICD-10-CM | POA: Diagnosis not present

## 2018-11-27 DIAGNOSIS — Z79899 Other long term (current) drug therapy: Secondary | ICD-10-CM | POA: Diagnosis not present

## 2018-11-27 DIAGNOSIS — Z96651 Presence of right artificial knee joint: Secondary | ICD-10-CM | POA: Diagnosis not present

## 2018-11-27 DIAGNOSIS — G4733 Obstructive sleep apnea (adult) (pediatric): Secondary | ICD-10-CM | POA: Diagnosis not present

## 2018-11-27 DIAGNOSIS — I2510 Atherosclerotic heart disease of native coronary artery without angina pectoris: Secondary | ICD-10-CM | POA: Insufficient documentation

## 2018-11-27 DIAGNOSIS — R1310 Dysphagia, unspecified: Secondary | ICD-10-CM | POA: Insufficient documentation

## 2018-11-27 DIAGNOSIS — G4730 Sleep apnea, unspecified: Secondary | ICD-10-CM | POA: Diagnosis not present

## 2018-11-27 DIAGNOSIS — E1142 Type 2 diabetes mellitus with diabetic polyneuropathy: Secondary | ICD-10-CM | POA: Insufficient documentation

## 2018-11-27 DIAGNOSIS — K228 Other specified diseases of esophagus: Secondary | ICD-10-CM | POA: Diagnosis not present

## 2018-11-27 DIAGNOSIS — Z9981 Dependence on supplemental oxygen: Secondary | ICD-10-CM | POA: Insufficient documentation

## 2018-11-27 DIAGNOSIS — K219 Gastro-esophageal reflux disease without esophagitis: Secondary | ICD-10-CM | POA: Insufficient documentation

## 2018-11-27 DIAGNOSIS — E663 Overweight: Secondary | ICD-10-CM | POA: Diagnosis not present

## 2018-11-27 DIAGNOSIS — K220 Achalasia of cardia: Secondary | ICD-10-CM

## 2018-11-27 DIAGNOSIS — N183 Chronic kidney disease, stage 3 (moderate): Secondary | ICD-10-CM | POA: Diagnosis not present

## 2018-11-27 DIAGNOSIS — E1122 Type 2 diabetes mellitus with diabetic chronic kidney disease: Secondary | ICD-10-CM | POA: Insufficient documentation

## 2018-11-27 DIAGNOSIS — Z951 Presence of aortocoronary bypass graft: Secondary | ICD-10-CM | POA: Insufficient documentation

## 2018-11-27 DIAGNOSIS — M059 Rheumatoid arthritis with rheumatoid factor, unspecified: Secondary | ICD-10-CM | POA: Insufficient documentation

## 2018-11-27 DIAGNOSIS — I5032 Chronic diastolic (congestive) heart failure: Secondary | ICD-10-CM | POA: Diagnosis not present

## 2018-11-27 DIAGNOSIS — Z6826 Body mass index (BMI) 26.0-26.9, adult: Secondary | ICD-10-CM | POA: Diagnosis not present

## 2018-11-27 DIAGNOSIS — Z7902 Long term (current) use of antithrombotics/antiplatelets: Secondary | ICD-10-CM | POA: Diagnosis not present

## 2018-11-27 DIAGNOSIS — D509 Iron deficiency anemia, unspecified: Secondary | ICD-10-CM | POA: Diagnosis not present

## 2018-11-27 DIAGNOSIS — Z87891 Personal history of nicotine dependence: Secondary | ICD-10-CM | POA: Diagnosis not present

## 2018-11-27 DIAGNOSIS — E785 Hyperlipidemia, unspecified: Secondary | ICD-10-CM | POA: Diagnosis not present

## 2018-11-27 DIAGNOSIS — Z955 Presence of coronary angioplasty implant and graft: Secondary | ICD-10-CM | POA: Diagnosis not present

## 2018-11-27 HISTORY — DX: Dependence on supplemental oxygen: Z99.81

## 2018-11-27 HISTORY — PX: ESOPHAGEAL DILATION: SHX303

## 2018-11-27 HISTORY — DX: Presence of spectacles and contact lenses: Z97.3

## 2018-11-27 HISTORY — DX: Polyneuropathy, unspecified: G62.9

## 2018-11-27 HISTORY — DX: Personal history of other diseases of the nervous system and sense organs: Z86.69

## 2018-11-27 HISTORY — DX: Iron deficiency anemia, unspecified: D50.9

## 2018-11-27 HISTORY — DX: Presence of dental prosthetic device (complete) (partial): Z97.2

## 2018-11-27 HISTORY — PX: ESOPHAGOGASTRODUODENOSCOPY (EGD) WITH PROPOFOL: SHX5813

## 2018-11-27 HISTORY — PX: BOTOX INJECTION: SHX5754

## 2018-11-27 LAB — GLUCOSE, CAPILLARY: Glucose-Capillary: 163 mg/dL — ABNORMAL HIGH (ref 70–99)

## 2018-11-27 SURGERY — ESOPHAGOGASTRODUODENOSCOPY (EGD) WITH PROPOFOL
Anesthesia: Monitor Anesthesia Care

## 2018-11-27 MED ORDER — ONABOTULINUMTOXINA 100 UNITS IJ SOLR
INTRAMUSCULAR | Status: AC
  Filled 2018-11-27: qty 100

## 2018-11-27 MED ORDER — PROPOFOL 500 MG/50ML IV EMUL
INTRAVENOUS | Status: DC | PRN
  Administered 2018-11-27: 50 ug/kg/min via INTRAVENOUS

## 2018-11-27 MED ORDER — SODIUM CHLORIDE 0.9 % IV SOLN
INTRAVENOUS | Status: DC
  Administered 2018-11-27: 500 mL via INTRAVENOUS

## 2018-11-27 MED ORDER — SODIUM CHLORIDE (PF) 0.9 % IJ SOLN
INTRAMUSCULAR | Status: AC
  Filled 2018-11-27: qty 10

## 2018-11-27 MED ORDER — LABETALOL HCL 5 MG/ML IV SOLN
10.0000 mg | Freq: Once | INTRAVENOUS | Status: AC
  Administered 2018-11-27: 10 mg via INTRAVENOUS

## 2018-11-27 MED ORDER — SODIUM CHLORIDE (PF) 0.9 % IJ SOLN
INTRAMUSCULAR | Status: DC | PRN
  Administered 2018-11-27: 100 mL via SUBMUCOSAL

## 2018-11-27 MED ORDER — LIDOCAINE HCL 1 % IJ SOLN
INTRAMUSCULAR | Status: DC | PRN
  Administered 2018-11-27: 40 mg via INTRADERMAL

## 2018-11-27 MED ORDER — LABETALOL HCL 5 MG/ML IV SOLN
INTRAVENOUS | Status: AC
  Filled 2018-11-27: qty 4

## 2018-11-27 MED ORDER — LABETALOL HCL 5 MG/ML IV SOLN
5.0000 mg | Freq: Once | INTRAVENOUS | Status: AC
  Administered 2018-11-27: 5 mg via INTRAVENOUS

## 2018-11-27 MED ORDER — LABETALOL HCL 5 MG/ML IV SOLN
20.0000 mg | Freq: Once | INTRAVENOUS | Status: AC
  Administered 2018-11-27: 20 mg via INTRAVENOUS

## 2018-11-27 SURGICAL SUPPLY — 15 items

## 2018-11-27 NOTE — Op Note (Signed)
[facility] Patient Name: [name] Procedure Date: 11/27/2018 MRN: [id number] Attending MD: Docia Chuck. Henrene Pastor , MD Date of Birth: 15-Dec-1931 CSN: [id number] Age: 85 Admit Type: Outpatient Procedure:                Upper GI endoscopy with submucosal injection; with                            balloon dilation of esophagus Indications:              Therapeutic procedure, Dysphagia. Achalasia.                            Previous Botox injection and balloon dilation with                            success (transient) Providers:                Docia Chuck. Henrene Pastor, MD, Cleda Daub, RN, Cherylynn Ridges, Technician, Karis Juba, CRNA Referring MD:              Medicines:                Monitored Anesthesia Care Complications:            No immediate complications. Estimated Blood Loss:     Estimated blood loss: none. Procedure:                Pre-Anesthesia Assessment:                           - Prior to the procedure, a History and Physical                            was performed, and patient medications and                            allergies were reviewed. The patient's tolerance of                            previous anesthesia was also reviewed. The risks                            and benefits of the procedure and the sedation                            options and risks were discussed with the patient.                            All questions were answered, and informed consent                            was obtained. Prior Anticoagulants: The patient has  taken Plavix (clopidogrel), last dose was 7 days                            prior to procedure. ASA Grade Assessment: III - A                            patient with severe systemic disease. After                            reviewing the risks and benefits, the patient was                            deemed in satisfactory condition to undergo the             procedure.                           After obtaining informed consent, the endoscope was                            passed under direct vision. Throughout the                            procedure, the patient's blood pressure, pulse, and                            oxygen saturations were monitored continuously. The                            GIF-H190 ([id number] Olympus gastroscope was                            introduced through the mouth, and advanced to the                            second part of duodenum. The upper GI endoscopy was                            accomplished without difficulty. The patient                            tolerated the procedure well. Scope In: Scope Out: Findings:      The esophagus was slightly tortuous but otherwise normal. Area was       successfully injected with 100 units botulinum toxin (25 units into each       of 4 quadrants 1 cm above the mucosal Z line at a 45 degree angle). A       TTS dilator was passed through the scope. Dilation with an 18-19-20 mm       balloon dilator was performed to 20 mm.      The stomach was normal.      The examined duodenum was normal.      The cardia and gastric fundus were normal on retroflexion. Impression:               - Esophagus. Injected with  botulinum toxin. Dilated.                           - Normal stomach.                           - Normal examined duodenum.                           - No specimens collected. Moderate Sedation:      none Recommendation:           - Patient has a contact number available for                            emergencies. The signs and symptoms of potential                            delayed complications were discussed with the                            patient. Return to normal activities tomorrow.                            Written discharge instructions were provided to the                            patient.                           - Resume previous diet.                            - Continue present medications.                           - Resume Plavix (clopidogrel) at prior dose today. Procedure Code(s):        --- Professional ---                           [phone], Esophagogastroduodenoscopy, flexible,                            transoral; with transendoscopic balloon dilation of                            esophagus (less than 30 mm diameter) Diagnosis Code(s):        --- Professional ---                           R13.10, Dysphagia, unspecified CPT copyright 2019 American Medical Association. All rights reserved. The codes documented in this report are preliminary and upon coder review may  be revised to meet current compliance requirements. Docia Chuck. Henrene Pastor, MD 11/27/2018 8:57:00 AM This report has been signed electronically. Number of Addenda: 0

## 2018-11-27 NOTE — Progress Notes (Signed)
Pt's BP 211/66. Notified Dr. Daiva Huge. 5mg  of Labetolol IV ordered. Call back if SBP > 180.

## 2018-11-27 NOTE — H&P (Signed)
HISTORY OF PRESENT ILLNESS:  [name] is a 85 y.o. male with multiple medical problems including achalasia for which she is undergone prior Botox injection and dilation.  Now back for the same after recurrent dysphasia after his last procedure June 2020.  Has been off Plavix for 1 week.  Otherwise stable since his last evaluation  REVIEW OF SYSTEMS:  All non-GI ROS negative.  Past Medical History:  Diagnosis Date  . Arthritis    osteoarthritis, s/p R TKR, and digits  . CAD (coronary artery disease)    a. s/p CABG (2001)  b. s/p DES to RCA and cutting POBA to ostial PDA (2013)   c. s/p DES to SVG to OM2 (01/14/14) d. cath: 08/2015 NSTEMI w/ patent LIMA-LAD and 99% stenosis of SVG-OM w/ DES placed. CTO of SVG-RCA and SVG-D1.   . Cataract   . Chronic diastolic CHF (congestive heart failure) ([place])    a) 09/13 ECHO- LVEF 88-41%, grade 1 diastolic dysfunction, mild LA dilatation, atrial septal aneurysm, AV mobility restricted, but no sig AS by doppler; b) 09/04/08 ECHO- LVH, ef 60%, mild AS, c. echo 08/2015: EF perserved of 55-60% with inferolateral HK. Mild AS noted.  . Chronic kidney disease, stage III (moderate) ([place])   . Chronic lower back pain   . Colon polyps   . Diverticulosis   . Dyspnea 2009 since July -Sept   05/06/08-CPST-  normal effort, reduced VO2 max 20.5 /65%, reduced at 8.2/ 40%, normal breathing resetvca of 55%, submaximal heart rate response 112/77%, flattened o2 pluse response at peak exercise-12 ml/beat @ 85%, No VQ mismatch abnormalities, All c/w CIRC Limitation  . Enlarged prostate   . Esophageal stricture    a. s/p dilation spring 2010  . GERD (gastroesophageal reflux disease)   . Heart murmur   . Hiatal hernia   . History of carpal tunnel syndrome    Bilateral  . History of kidney stones   . History of PFTs    mixed pattern on spiro. mild restn on lung volumes with near normal DLCO. Pattern can be explained by CABG scar. Fev1 2.2L/73%, ratio 68 (67), TLC  4.7/68%,RV 1.5L/55%,DLCO 79%  . Hyperlipidemia   . Hypertension   . Interstitial lung disease ([name])    NOS  . Iron deficiency anemia   . Nausea & vomiting    2018/2019  . On home oxygen therapy    2 L [place] at bedtime  . Osteoporosis   . Overweight (BMI 25.0-29.9)    BMI 29  . Peripheral neuropathy   . RA (rheumatoid arthritis) ([name])    [name]  . Seropositive rheumatoid arthritis ([place])   . Type II diabetes mellitus ([name])    diet controlled  . Wears glasses   . Wears partial dentures    upper    Past Surgical History:  Procedure Laterality Date  . BALLOON DILATION N/A 09/12/2018   Procedure: BALLOON DILATION;  Surgeon: Irene Shipper, MD;  Location: [facility];  Service: Endoscopy;  Laterality: N/A;  . BOTOX INJECTION N/A 09/12/2018   Procedure: BOTOX INJECTION;  Surgeon: Irene Shipper, MD;  Location: [facility];  Service: Endoscopy;  Laterality: N/A;  . CARDIAC CATHETERIZATION  08/2004   CP- no MI, Cath- small vessell disease   . CARDIAC CATHETERIZATION  12/31/2011   80% distal LM, 100% native LAD, LCx and RCA, 30% prox SVG-OM, SVG-D1 normal, 99% distal, 80% ostial SVG-RCA distal to graft, LIMA-LAD normal; LVEF mildly decreased with posterior basal AK   .  CARDIAC CATHETERIZATION  2009   with patent grafts/notes 12/31/2011  . CARDIAC CATHETERIZATION N/A 08/13/2015   Procedure: Left Heart Cath and Cors/Grafts Angiography;  Surgeon: Sherren Mocha, MD;  Location: [facility];  Service: Cardiovascular;  Laterality: N/A;  . CATARACT EXTRACTION W/ INTRAOCULAR LENS  IMPLANT, BILATERAL Bilateral   . CHOLECYSTECTOMY OPEN  11/2003   [name]  . CORONARY ANGIOPLASTY WITH STENT PLACEMENT  01/03/2012   Successful DES to SVG-RCA and cutting balloon angioplasty ostial  PDA   . CORONARY ANGIOPLASTY WITH STENT PLACEMENT  01/14/2014   "1"  . CORONARY ARTERY BYPASS GRAFT  11/1999   CABG X5  . CORONARY STENT PLACEMENT  02/2012   1 stent and balloon  . ESOPHAGOGASTRODUODENOSCOPY N/A  03/01/2017   Procedure: ESOPHAGOGASTRODUODENOSCOPY (EGD);  Surgeon: Irene Shipper, MD;  Location: [facility];  Service: Endoscopy;  Laterality: N/A;  . ESOPHAGOGASTRODUODENOSCOPY (EGD) WITH ESOPHAGEAL DILATION  2010  . ESOPHAGOGASTRODUODENOSCOPY (EGD) WITH PROPOFOL N/A 09/12/2018   Procedure: ESOPHAGOGASTRODUODENOSCOPY (EGD) WITH PROPOFOL;  Surgeon: Irene Shipper, MD;  Location: [facility];  Service: Endoscopy;  Laterality: N/A;  . HAND SURGERY     bilateral carpal tunnel releases  . JOINT REPLACEMENT    . KNEE ARTHROSCOPY Right 2008  . LEFT AND RIGHT HEART CATHETERIZATION WITH CORONARY ANGIOGRAM  12/31/2011   Procedure: LEFT AND RIGHT HEART CATHETERIZATION WITH CORONARY ANGIOGRAM;  Surgeon: Burnell Blanks, MD;  Location: [facility] CATH LAB;  Service: Cardiovascular;;  . LEFT AND RIGHT HEART CATHETERIZATION WITH CORONARY ANGIOGRAM N/A 01/14/2014   Procedure: LEFT AND RIGHT HEART CATHETERIZATION WITH CORONARY ANGIOGRAM;  Surgeon: Peter M Martinique, MD;  Location: [facility] CATH LAB;  Service: Cardiovascular;  Laterality: N/A;  . MALONEY DILATION  03/01/2017   Procedure: Venia Minks DILATION;  Surgeon: Irene Shipper, MD;  Location: [facility];  Service: Endoscopy;;  . PERCUTANEOUS CORONARY INTERVENTION-BALLOON ONLY  01/03/2012   Procedure: PERCUTANEOUS CORONARY INTERVENTION-BALLOON ONLY;  Surgeon: Peter M Martinique, MD;  Location: [facility] LAB;  Service: Cardiovascular;;  . PERCUTANEOUS CORONARY STENT INTERVENTION (PCI-S)  12/31/2011   Procedure: PERCUTANEOUS CORONARY STENT INTERVENTION (PCI-S);  Surgeon: Burnell Blanks, MD;  Location: [facility] CATH LAB;  Service: Cardiovascular;;  . PERCUTANEOUS CORONARY STENT INTERVENTION (PCI-S) N/A 01/03/2012   Procedure: PERCUTANEOUS CORONARY STENT INTERVENTION (PCI-S);  Surgeon: Peter M Martinique, MD;  Location: [facility] CATH LAB;  Service: Cardiovascular;  Laterality: N/A;  . SHOULDER ARTHROSCOPY WITH OPEN ROTATOR CUFF REPAIR AND DISTAL CLAVICLE ACROMINECTOMY Left 02/27/2013    Procedure: LEFT SHOULDER ARTHROSCOPY WITH MINI OPEN ROTATOR CUFF REPAIR AND SUBACROMIAL DECOMPRESSION AND DISTAL CLAVICLE RESECTION;  Surgeon: [name], MD;  Location: [place];  Service: Orthopedics;  Laterality: Left;  . TOTAL KNEE ARTHROPLASTY Right 03/2010   [name]  . TRIGGER FINGER RELEASE Left 02/27/2013   Procedure: RELEASE TRIGGER FINGER/A-1 PULLEY;  Surgeon: [name], MD;  Location: [place];  Service: Orthopedics;  Laterality: Left;    Social History [name]  reports that he quit smoking about 55 years ago. His smoking use included cigarettes. He has a 20.00 pack-year smoking history. He has never used smokeless tobacco. He reports that he does not drink alcohol or use drugs.  family history includes Alcohol abuse in his sister; COPD in his mother; Colon cancer (age of onset: 37) in his brother; Diabetes in his brother; Heart attack in his father; Heart disease in his father; Stomach cancer in his brother; Stroke in his sister.  Allergies  Allergen Reactions  .  Doxazosin Mesylate Other (See Comments)    dizziness  . Methocarbamol Rash       PHYSICAL EXAMINATION: Vital signs: BP (!) 230/77   Pulse 66   Temp 97.8 F (36.6 C) (Oral)   Resp 19   Ht 6' (1.829 m)   Wt 87.5 kg   SpO2 98%   BMI 26.18 kg/m   Constitutional: generally well-appearing, no acute distress Psychiatric: alert and oriented x3, cooperative Eyes: extraocular movements intact, anicteric, conjunctiva pink Mouth: oral pharynx moist, no lesions Neck: supple no lymphadenopathy Cardiovascular: heart regular rate and rhythm, no murmur Lungs: clear to auscultation bilaterally Abdomen: soft, nontender, nondistended, no obvious ascites, no peritoneal signs, normal bowel sounds, no organomegaly Rectal: Omitted Extremities: no lower extremity edema bilaterally Skin: no lesions on visible extremities Neuro: No focal deficits.  Cranial nerves intact  ASSESSMENT:  1.  Achalasia with  dysphasia   PLAN:  1.  Upper endoscopy with Botox injection and balloon dilation of the distal esophagus.The nature of the procedure, as well as the risks, benefits, and alternatives were carefully and thoroughly reviewed with the patient. Ample time for discussion and questions allowed. The patient understood, was satisfied, and agreed to proceed.

## 2018-11-27 NOTE — Discharge Instructions (Signed)
YOU HAD AN ENDOSCOPIC PROCEDURE TODAY: Refer to the procedure report and other information in the discharge instructions given to you for any specific questions about what was found during the examination. If this information does not answer your questions, please call [place] office at [phone] to clarify.   YOU SHOULD EXPECT: Some feelings of bloating in the abdomen. Passage of more gas than usual. Walking can help get rid of the air that was put into your GI tract during the procedure and reduce the bloating. If you had a lower endoscopy (such as a colonoscopy or flexible sigmoidoscopy) you may notice spotting of blood in your stool or on the toilet paper. Some abdominal soreness may be present for a day or two, also.  DIET: Your first meal following the procedure should be a light meal and then it is ok to progress to your normal diet. A half-sandwich or bowl of soup is an example of a good first meal. Heavy or fried foods are harder to digest and may make you feel nauseous or bloated. Drink plenty of fluids but you should avoid alcoholic beverages for 24 hours. If you had a esophageal dilation, please see attached instructions for diet.    ACTIVITY: Your care partner should take you home directly after the procedure. You should plan to take it easy, moving slowly for the rest of the day. You can resume normal activity the day after the procedure however YOU SHOULD NOT DRIVE, use power tools, machinery or perform tasks that involve climbing or major physical exertion for 24 hours (because of the sedation medicines used during the test).   SYMPTOMS TO REPORT IMMEDIATELY: A gastroenterologist can be reached at any hour. Please call [phone]  for any of the following symptoms:   Following upper endoscopy (EGD, EUS, ERCP, esophageal dilation) Vomiting of blood or coffee ground material  New, significant abdominal pain  New, significant chest pain or pain under the shoulder blades  Painful or  persistently difficult swallowing  New shortness of breath  Black, tarry-looking or red, bloody stools  FOLLOW UP:  If any biopsies were taken you will be contacted by phone or by letter within the next 1-3 weeks. Call [phone]  if you have not heard about the biopsies in 3 weeks.  Please also call with any specific questions about appointments or follow up tests.  

## 2018-11-27 NOTE — Progress Notes (Signed)
Pt's BP 199/70. Notified Dr. Daiva Huge. She is okay with BP and it is okay to discharge patient.

## 2018-11-27 NOTE — Anesthesia Postprocedure Evaluation (Signed)
Anesthesia Post Note  Patient: [name]  Procedure(s) Performed: ESOPHAGOGASTRODUODENOSCOPY (EGD) WITH PROPOFOL, WITH BALLOON DILATION (N/A ) BOTOX INJECTION (N/A ) ESOPHAGEAL DILATION     Patient location during evaluation: PACU Anesthesia Type: MAC Level of consciousness: awake and alert and oriented Pain management: pain level controlled Vital Signs Assessment: post-procedure vital signs reviewed and stable Respiratory status: spontaneous breathing, nonlabored ventilation and respiratory function stable Cardiovascular status: blood pressure returned to baseline Postop Assessment: no apparent nausea or vomiting Anesthetic complications: no    Last Vitals:  Vitals:   11/27/18 0855 11/27/18 0900  BP: (!) 181/51 (!) 200/69  Pulse: (!) 59 63  Resp: 16 18  Temp: (!) 36.4 C   SpO2: 99% 96%    Last Pain:  Vitals:   11/27/18 0855  TempSrc: Temporal  PainSc: 0-No pain                 Brennan Bailey

## 2018-11-27 NOTE — Progress Notes (Signed)
Pt BP is 207/64. Notified Dr. Daiva Huge. 10mg  of Labetolol IV ordered. Call back in 15 mins.

## 2018-11-27 NOTE — Transfer of Care (Signed)
Immediate Anesthesia Transfer of Care Note  Patient: [name]  Procedure(s) Performed: ESOPHAGOGASTRODUODENOSCOPY (EGD) WITH PROPOFOL, WITH BALLOON DILATION (N/A ) BOTOX INJECTION (N/A ) ESOPHAGEAL DILATION  Patient Location: PACU and Endoscopy Unit  Anesthesia Type:MAC  Level of Consciousness: awake, alert , oriented and patient cooperative  Airway & Oxygen Therapy: Patient Spontanous Breathing and Patient connected to nasal cannula oxygen  Post-op Assessment: Report given to RN and Post -op Vital signs reviewed and stable  Post vital signs: Reviewed and stable  Last Vitals:  Vitals Value Taken Time  BP    Temp    Pulse 62 11/27/18 0853  Resp 22 11/27/18 0853  SpO2 100 % 11/27/18 0853  Vitals shown include unvalidated device data.  Last Pain:  Vitals:   11/27/18 0732  TempSrc: Oral  PainSc: 0-No pain         Complications: No apparent anesthesia complications

## 2018-11-27 NOTE — Progress Notes (Signed)
Pt's BP 208/74. Dr. Daiva Huge at bedside. Ordered 20mg  of IV Labetolol. Will continue to monitor.

## 2018-11-30 ENCOUNTER — Encounter (HOSPITAL_COMMUNITY): Payer: Self-pay | Admitting: Internal Medicine

## 2018-12-14 ENCOUNTER — Ambulatory Visit (HOSPITAL_BASED_OUTPATIENT_CLINIC_OR_DEPARTMENT_OTHER): Payer: Medicare Other | Admitting: Student in an Organized Health Care Education/Training Program

## 2018-12-14 ENCOUNTER — Encounter: Payer: Self-pay | Admitting: Student in an Organized Health Care Education/Training Program

## 2018-12-14 DIAGNOSIS — M79604 Pain in right leg: Secondary | ICD-10-CM

## 2018-12-14 DIAGNOSIS — M059 Rheumatoid arthritis with rheumatoid factor, unspecified: Secondary | ICD-10-CM

## 2018-12-14 DIAGNOSIS — G894 Chronic pain syndrome: Secondary | ICD-10-CM

## 2018-12-14 DIAGNOSIS — M792 Neuralgia and neuritis, unspecified: Secondary | ICD-10-CM

## 2018-12-14 DIAGNOSIS — N1832 Chronic kidney disease, stage 3b: Secondary | ICD-10-CM | POA: Insufficient documentation

## 2018-12-14 DIAGNOSIS — E1142 Type 2 diabetes mellitus with diabetic polyneuropathy: Secondary | ICD-10-CM | POA: Diagnosis not present

## 2018-12-14 DIAGNOSIS — N183 Chronic kidney disease, stage 3 (moderate): Secondary | ICD-10-CM

## 2018-12-14 DIAGNOSIS — E1122 Type 2 diabetes mellitus with diabetic chronic kidney disease: Secondary | ICD-10-CM

## 2018-12-14 DIAGNOSIS — G8929 Other chronic pain: Secondary | ICD-10-CM

## 2018-12-14 DIAGNOSIS — G629 Polyneuropathy, unspecified: Secondary | ICD-10-CM

## 2018-12-14 DIAGNOSIS — Z79891 Long term (current) use of opiate analgesic: Secondary | ICD-10-CM | POA: Diagnosis not present

## 2018-12-14 DIAGNOSIS — M79605 Pain in left leg: Secondary | ICD-10-CM

## 2018-12-14 MED ORDER — HYDROCODONE-ACETAMINOPHEN 10-325 MG PO TABS: 1.0000 | ORAL_TABLET | Freq: Four times a day (QID) | ORAL | 0 refills | Status: AC | PRN

## 2018-12-14 MED ORDER — HYDROCODONE-ACETAMINOPHEN 10-325 MG PO TABS: 1.0000 | ORAL_TABLET | Freq: Four times a day (QID) | ORAL | 0 refills | Status: DC | PRN

## 2018-12-14 NOTE — Progress Notes (Signed)
Pain Management Virtual Encounter Note - Virtual Visit via Telephone Telehealth (real-time audio visits between healthcare provider and patient).   Patient's Phone No. & Preferred Pharmacy:  [phone] (home); [phone] (mobile); (Preferred) [phone] No e-mail address on record  [place], [place], [address], [place] [address] Phone: [phone] Fax: [phone]  [place], [place] [address] [place] [address] Phone: [phone] Fax: [phone]    Pre-screening note:  Our staff contacted Mr. Mees and offered him an "in person", "face-to-face" appointment versus a telephone encounter. He indicated preferring the telephone encounter, at this time.   Reason for Virtual Visit: COVID-19*  Social distancing based on CDC and AMA recommendations.   I contacted [name] on 12/14/2018 via telephone.      I clearly identified myself as Gillis Santa, MD. I verified that I was speaking with the correct person using two identifiers (Name: [name], and date of birth: 1931-12-02).  Advanced Informed Consent I sought verbal advanced consent from [name] for virtual visit interactions. I informed [name] of possible security and privacy concerns, risks, and limitations associated with providing "not-in-person" medical evaluation and management services. I also informed [name] of the availability of "in-person" appointments. Finally, I informed him that there would be a charge for the virtual visit and that he could be  personally, fully or partially, financially responsible for it. [name] expressed understanding and agreed to proceed.   Historic Elements   [name] is a 85 y.o. year old, male patient evaluated today after his last encounter by our practice on 09/13/2018. [name]  has a past medical history of Arthritis,  CAD (coronary artery disease), Cataract, Chronic diastolic CHF (congestive heart failure) ([place]), Chronic kidney disease, stage III (moderate) ([place]), Chronic lower back pain, Colon polyps, Diverticulosis, Dyspnea (2009 since July -Sept), Enlarged prostate, Esophageal stricture, GERD (gastroesophageal reflux disease), Heart murmur, Hiatal hernia, History of carpal tunnel syndrome, History of kidney stones, History of PFTs, Hyperlipidemia, Hypertension, Interstitial lung disease ([place]), Iron deficiency anemia, Nausea & vomiting, On home oxygen therapy, Osteoporosis, Overweight (BMI 25.0-29.9), Peripheral neuropathy, RA (rheumatoid arthritis) ([place]), Seropositive rheumatoid arthritis ([place]), Type II diabetes mellitus ([place]), Wears glasses, and Wears partial dentures. He also  has a past surgical history that includes Total knee arthroplasty (Right, 03/2010); Knee arthroscopy (Right, 2008); Cataract extraction w/ intraocular lens  implant, bilateral (Bilateral); Coronary artery bypass graft (11/1999); Coronary stent placement (02/2012); Shoulder arthroscopy with open rotator cuff repair and distal clavicle acrominectomy (Left, 02/27/2013); Trigger finger release (Left, 02/27/2013); Cholecystectomy open (11/2003); Joint replacement; Esophagogastroduodenoscopy (egd) with esophageal dilation (2010); Cardiac catheterization (08/2004); Cardiac catheterization (12/31/2011); Cardiac catheterization (2009); left and right heart catheterization with coronary angiogram (12/31/2011); percutaneous coronary stent intervention (pci-s) (12/31/2011); percutaneous coronary stent intervention (pci-s) (N/A, 01/03/2012); Percutaneous coronary intervention-balloon only (01/03/2012); left and right heart catheterization with coronary angiogram (N/A, 01/14/2014); Hand surgery; Cardiac catheterization (N/A, 08/13/2015); Esophagogastroduodenoscopy (N/A, 03/01/2017); maloney dilation (03/01/2017); Esophagogastroduodenoscopy (egd) with propofol (N/A,  09/12/2018); Botox injection (N/A, 09/12/2018); Balloon dilation (N/A, 09/12/2018); Coronary angioplasty with stent (01/03/2012); Coronary angioplasty with stent (01/14/2014); Esophagogastroduodenoscopy (egd) with propofol (N/A, 11/27/2018); Botox injection (N/A, 11/27/2018); and Esophageal dilation (11/27/2018). [name] has a current medication list which includes the following prescription(s): aspirin ec, one touch ultra 2, carvedilol, cholecalciferol, clopidogrel, dha-vitamin c-lutein, furosemide, glucose blood, hydrocodone-acetaminophen, hydrocodone-acetaminophen, hydrocodone-acetaminophen, nitroglycerin, onetouch delica lancets 51W, oxygen-helium, pantoprazole,  and polyethylene glycol. He  reports that he quit smoking about 55 years ago. His smoking use included cigarettes. He has a 20.00 pack-year smoking history. He has never used smokeless tobacco. He reports that he does not drink alcohol or use drugs. [name] is allergic to doxazosin mesylate and methocarbamol.   HPI  Today, he is being contacted for medication management.   No change in medical history since last visit.  Patient's pain is at baseline.  Patient continues multimodal pain regimen as prescribed.  States that it provides pain relief and improvement in functional status.   Pharmacotherapy Assessment  Analgesic: 11/20/2018  2   09/13/2018  Hydrocodone-Acetamin 10-325 MG  120.00  30 Bi Lat   [id number]   Gib (4800)   0  40.00 MME  Medicare   [place]    Monitoring: Pharmacotherapy: No side-effects or adverse reactions reported. [place] PMP: PDMP reviewed during this encounter.       Compliance: No problems identified. Effectiveness: Clinically acceptable. Plan: Refer to "POC".  UDS:  Summary  Date Value Ref Range Status  10/26/2017 FINAL  Final    Comment:    ==================================================================== TOXASSURE COMP DRUG ANALYSIS,UR ==================================================================== Test                              Result       Flag       Units Drug Present and Declared for Prescription Verification   Hydrocodone                    2548         EXPECTED   ng/mg creat   Hydromorphone                  124          EXPECTED   ng/mg creat   Dihydrocodeine                 251          EXPECTED   ng/mg creat   Norhydrocodone                 1816         EXPECTED   ng/mg creat    Sources of hydrocodone include scheduled prescription    medications. Hydromorphone, dihydrocodeine and norhydrocodone are    expected metabolites of hydrocodone. Hydromorphone and    dihydrocodeine are also available as scheduled prescription    medications.   Acetaminophen                  PRESENT      EXPECTED Drug Absent but Declared for Prescription Verification   Salicylate                     Not Detected UNEXPECTED    Aspirin, as indicated in the declared medication list, is not    always detected even when used as directed. ==================================================================== Test                      Result    Flag   Units      Ref Range   Creatinine              87               mg/dL      >=20 ==================================================================== Declared Medications:  The flagging and  interpretation on this report are based on the  following declared medications.  Unexpected results may arise from  inaccuracies in the declared medications.  **Note: The testing scope of this panel includes these medications:  Hydrocodone (Norco)  **Note: The testing scope of this panel does not include small to  moderate amounts of these reported medications:  Acetaminophen (Norco)  Aspirin (Aspirin 81)  **Note: The testing scope of this panel does not include following  reported medications:  Carvedilol (Coreg)  Clopidogrel (Plavix)  Furosemide (Lasix)  Iron (Ferrous Sulfate)  Lubiprostone (Amitiza)  Nitroglycerin (Nitrostat)  Ondansetron (Zofran)  Oxygen   Pantoprazole (Protonix)  Vitamin C  Vitamin D ==================================================================== For clinical consultation, please call [phone]. ====================================================================    Laboratory Chemistry Profile (12 mo)  Renal: 04/28/2018: BUN 32; Creatinine, Ser 2.24  Lab Results  Component Value Date   GFRAA 30 (L) 06/24/2017   GFRNONAA 26 (L) 06/24/2017   Hepatic: 04/28/2018: Albumin 4.1; Albumin 4.1 Lab Results  Component Value Date   AST 17 04/28/2018   ALT 14 04/28/2018   Other: No results found for requested labs within last 8760 hours. Note: Above Lab results reviewed.   Assessment  The primary encounter diagnosis was Neuropathic pain. Diagnoses of Chronic pain of both lower extremities, Chronic pain syndrome, Long term prescription opiate use, Diabetic polyneuropathy associated with type 2 diabetes mellitus ([place]), Neuropathy, Seropositive rheumatoid arthritis ([place]), and CKD stage 3 due to type 2 diabetes mellitus ([place]) were also pertinent to this visit.  Plan of Care  I am having Adama C. Schwarz start on HYDROcodone-acetaminophen, HYDROcodone-acetaminophen, and HYDROcodone-acetaminophen. I am also having him maintain his DHA-Vitamin C-Lutein (EYE HEALTH FORMULA PO), aspirin EC, OXYGEN, ONE TOUCH ULTRA 2, glucose blood, OneTouch Delica Lancets 46T, nitroGLYCERIN, polyethylene glycol, pantoprazole, carvedilol, clopidogrel, furosemide, and cholecalciferol.  Refill of hydrocodone as below.  Instructed patient to come in next week to submit annual urine toxicology screen for medication monitoring and compliance.  No concerns from my standpoint.  Continue with medications as prescribed as they help manage his pain, help facilitate ADLs and improved functional status.  Pharmacotherapy (Medications Ordered): Meds ordered this encounter  Medications  . HYDROcodone-acetaminophen (NORCO) 10-325 MG tablet    Sig: Take 1  tablet by mouth every 6 (six) hours as needed for severe pain. Must last 30 days.    Dispense:  120 tablet    Refill:  0    Chronic Pain. (STOP Act - Not applicable). Fill one day early if closed on scheduled refill date.  [name] HYDROcodone-acetaminophen (NORCO) 10-325 MG tablet    Sig: Take 1 tablet by mouth every 6 (six) hours as needed for severe pain. Must last 30 days.    Dispense:  120 tablet    Refill:  0    Chronic Pain. (STOP Act - Not applicable). Fill one day early if closed on scheduled refill date.  [name] HYDROcodone-acetaminophen (NORCO) 10-325 MG tablet    Sig: Take 1 tablet by mouth every 6 (six) hours as needed for severe pain. Must last 30 days.    Dispense:  120 tablet    Refill:  0    Chronic Pain. (STOP Act - Not applicable). Fill one day early if closed on scheduled refill date.   Orders:  Orders Placed This Encounter  Procedures  . ToxASSURE Select 13 (MW), Urine    Volume: 30 ml(s). Minimum 3 ml of urine is needed. Document temperature of fresh sample. Indications: Long term (current) use of opiate analgesic (  Z79.891)   Follow-up plan:   Return in about 3 months (around 03/15/2019) for Medication Management, virtual.    Recent Visits No visits were found meeting these conditions.  Showing recent visits within past 90 days and meeting all other requirements   Today's Visits Date Type Provider Dept  12/14/18 Office Visit Gillis Santa, MD [facility]  Showing today's visits and meeting all other requirements   Future Appointments No visits were found meeting these conditions.  Showing future appointments within next 90 days and meeting all other requirements   I discussed the assessment and treatment plan with the patient. The patient was provided an opportunity to ask questions and all were answered. The patient agreed with the plan and demonstrated an understanding of the instructions.  Patient advised to call back or seek an in-person evaluation if  the symptoms or condition worsens.  Total duration of non-face-to-face encounter: 25 minutes.  Note by: Gillis Santa, MD Date: 12/14/2018; Time: 2:44 PM  Note: This dictation was prepared with Dragon dictation. Any transcriptional errors that may result from this process are unintentional.  Disclaimer:  * Given the special circumstances of the COVID-19 pandemic, the federal government has announced that the Office for Civil Rights (OCR) will exercise its enforcement discretion and will not impose penalties on physicians using telehealth in the event of noncompliance with regulatory requirements under the [place] (HIPAA) in connection with the good faith provision of telehealth during the POEUM-35 national public health emergency. ([place])

## 2018-12-17 DIAGNOSIS — R0689 Other abnormalities of breathing: Secondary | ICD-10-CM | POA: Diagnosis not present

## 2018-12-17 DIAGNOSIS — J8410 Pulmonary fibrosis, unspecified: Secondary | ICD-10-CM | POA: Diagnosis not present

## 2018-12-19 DIAGNOSIS — G894 Chronic pain syndrome: Secondary | ICD-10-CM | POA: Diagnosis not present

## 2018-12-22 LAB — TOXASSURE SELECT 13 (MW), URINE

## 2018-12-29 ENCOUNTER — Other Ambulatory Visit: Payer: Self-pay

## 2018-12-29 NOTE — Patient Outreach (Signed)
[facility]) Care Management  12/29/2018  [name] 02-11-1932 [id number]   Attempted monthly outreach. Left HIPAA compliant voice message requesting a return call.   PLAN -Will follow-up within 3-4 business days.   [facility] [phone]

## 2019-01-01 DIAGNOSIS — I129 Hypertensive chronic kidney disease with stage 1 through stage 4 chronic kidney disease, or unspecified chronic kidney disease: Secondary | ICD-10-CM | POA: Diagnosis not present

## 2019-01-01 DIAGNOSIS — E1122 Type 2 diabetes mellitus with diabetic chronic kidney disease: Secondary | ICD-10-CM | POA: Diagnosis not present

## 2019-01-01 DIAGNOSIS — N184 Chronic kidney disease, stage 4 (severe): Secondary | ICD-10-CM | POA: Diagnosis not present

## 2019-01-01 DIAGNOSIS — I2510 Atherosclerotic heart disease of native coronary artery without angina pectoris: Secondary | ICD-10-CM | POA: Diagnosis not present

## 2019-01-03 DIAGNOSIS — L218 Other seborrheic dermatitis: Secondary | ICD-10-CM | POA: Diagnosis not present

## 2019-01-03 DIAGNOSIS — Z85828 Personal history of other malignant neoplasm of skin: Secondary | ICD-10-CM | POA: Diagnosis not present

## 2019-01-03 DIAGNOSIS — L570 Actinic keratosis: Secondary | ICD-10-CM | POA: Diagnosis not present

## 2019-01-05 ENCOUNTER — Other Ambulatory Visit: Payer: Self-pay

## 2019-01-08 NOTE — Progress Notes (Signed)
Office Visit Note  Patient: [name]             Date of Birth: 11-23-1931           MRN: [id number]             PCP: [name], MD Referring: [name], MD Visit Date: 01/10/2019 Occupation: @GUAROCC @  Subjective:  Pain in both hands  History of Present Illness: [name] is a 85 y.o. male with history of seropositive rheumatoid arthritis and ILD.  He is followed by Dr. Chase Caller.  He is not on any immunosuppressive agents at this time.  He is going to pain management and is prescribed hydrocodone for pain relief.  He presents today with increased pain in bilateral hands.  He states he notices intermittent joint swelling.  He denies any other increased joint pain or joint swelling at this time.  He denies any new or worsening pulmonary symptoms.  He has an appointment with Dr. Chase Caller next Wednesday on 01/17/19.  He previously was taking CellCept but discontinued due to GI side effects.  He also discontinued Imuran in the past due to side effects.  Activities of Daily Living:  Patient reports morning stiffness for 1 hour.   Patient Denies nocturnal pain.  Difficulty dressing/grooming: Denies Difficulty climbing stairs: Denies Difficulty getting out of chair: Denies Difficulty using hands for taps, buttons, cutlery, and/or writing: Denies  Review of Systems  Constitutional: Negative for fatigue and night sweats.  HENT: Positive for mouth dryness. Negative for mouth sores and nose dryness.   Eyes: Negative for redness, itching and dryness.  Respiratory: Positive for shortness of breath. Negative for difficulty breathing.   Cardiovascular: Negative for chest pain, palpitations, hypertension, irregular heartbeat and swelling in legs/feet.  Gastrointestinal: Positive for constipation. Negative for abdominal pain, blood in stool and diarrhea.  Endocrine: Negative for increased urination.  Genitourinary: Negative for painful urination.  Musculoskeletal:  Positive for arthralgias, joint pain and morning stiffness. Negative for joint swelling, myalgias, muscle weakness, muscle tenderness and myalgias.  Skin: Negative for color change, rash, hair loss, nodules/bumps, skin tightness, ulcers and sensitivity to sunlight.  Allergic/Immunologic: Negative for susceptible to infections.  Neurological: Negative for dizziness, fainting, light-headedness, numbness, headaches, memory loss, night sweats and weakness.  Hematological: Negative for swollen glands.  Psychiatric/Behavioral: Negative for depressed mood, confusion and sleep disturbance. The patient is not nervous/anxious.     PMFS History:  Patient Active Problem List   Diagnosis Date Noted  . Long term prescription opiate use 12/14/2018  . Neuropathy 12/14/2018  . CKD stage 3 due to type 2 diabetes mellitus ([place]) 12/14/2018  . Problems with swallowing and mastication   . Achalasia   . Right inguinal hernia 07/04/2018  . Advance directive discussed with patient 04/28/2018  . Chronic pain of both lower extremities 10/26/2017  . Neuropathic pain 10/26/2017  . Chronic pain syndrome 10/26/2017  . Iron deficiency anemia 07/05/2017  . Constipation 07/05/2017  . CKD (chronic kidney disease) stage 4, GFR 15-29 ml/min ([name]) 05/17/2017  . Seropositive rheumatoid arthritis ([place]) 05/17/2017  . DM (diabetes mellitus) type II controlled, neurological manifestation ([place]) 05/17/2017  . Atherosclerotic heart disease of native coronary artery with angina pectoris ([place]) 05/17/2017  . Dysphagia 05/17/2017  . High risk medication use 10/21/2016  . Primary osteoarthritis of both knees 10/21/2016  . History of right knee joint replacement 10/21/2016  . Orthostatic hypotension 10/01/2016  . Diarrhea 08/18/2016  . Tegretol-induced dizziness 05/14/2016  .  Spinal stenosis of lumbar region without neurogenic claudication 03/23/2016  . Spondylosis without myelopathy or radiculopathy, lumbar region 03/23/2016  .  Respiratory failure, chronic ([place]) 11/21/2014  . Rheumatoid arthritis ([place]) 11/05/2014  . Rheumatic fever without heart involvement 03/04/2014  . Ventral hernia 12/17/2013  . Routine general medical examination at a health care facility 08/29/2012  . Routine history and physical examination of adult 08/29/2012  . ILD (interstitial lung disease) ([place]) 11/28/2011  . Chronic diastolic heart failure ([place]) 09/14/2011  . Diabetic polyneuropathy associated with type 2 diabetes mellitus ([place]) 08/10/2010  . Obstructive sleep apnea 12/17/2008  . ESOPHAGEAL STRICTURE 10/09/2008  . Esophageal stricture 10/09/2008  . Reflux esophagitis 09/10/2008  . Diverticulosis of colon 09/10/2008  . Gastro-esophageal reflux disease with esophagitis 09/10/2008  . Coronary atherosclerosis 03/19/2008  . Aortic valve disorder 03/19/2008  . Thoracic aorta atherosclerosis ([facility]) 03/19/2008  . Actinic keratosis 10/23/2007  . SLEEP DISORDER, CHRONIC 10/17/2006  . Disturbance in sleep behavior 10/17/2006  . 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 Essential hypertension 09/23/2006  . GERD 09/23/2006  . BENIGN PROSTATIC HYPERTROPHY 09/23/2006  . Enlarged prostate without lower urinary tract symptoms (luts) 09/23/2006  . HLD (hyperlipidemia) 09/23/2006    Past Medical History:  Diagnosis Date  . Arthritis    osteoarthritis, s/p R TKR, and digits  . CAD (coronary artery disease)    a. s/p CABG (2001)  b. s/p DES to RCA and cutting POBA to ostial PDA (2013)   c. s/p DES to SVG to OM2 (01/14/14) d. cath: 08/2015 NSTEMI w/ patent LIMA-LAD and 99% stenosis of SVG-OM w/ DES placed. CTO of SVG-RCA and SVG-D1.   . Cataract   . Chronic diastolic CHF (congestive heart failure) ([place])    a) 09/13 ECHO- LVEF 84-13%, grade 1 diastolic dysfunction, mild LA dilatation, atrial septal aneurysm, AV mobility restricted, but no sig AS by doppler; b) 09/04/08 ECHO- LVH, ef 60%, mild AS, c. echo 08/2015: EF perserved of  55-60% with inferolateral HK. Mild AS noted.  . Chronic kidney disease, stage III (moderate)   . Chronic lower back pain   . Colon polyps   . Diverticulosis   . Dyspnea 2009 since July -Sept   05/06/08-CP[facility]-  normal effort, reduced VO2 max 20.5 /65%, reduced at 8.2/ 40%, normal breathing resetvca of 55%, submaximal heart rate response 112/77%, flattened o2 pluse response at peak exercise-12 ml/beat @ 85%, No VQ mismatch abnormalities, All c/w CIRC Limitation  . Enlarged prostate   . Esophageal stricture    a. s/p dilation spring 2010  . GERD (gastroesophageal reflux disease)   . Heart murmur   . Hiatal hernia   . History of carpal tunnel syndrome    Bilateral  . History of kidney stones   . History of PFTs    mixed pattern on spiro. mild restn on lung volumes with near normal DLCO. Pattern can be explained by CABG scar. Fev1 2.2L/73%, ratio 68 (67), TLC 4.7/68%,RV 1.5L/55%,DLCO 79%  . Hyperlipidemia   . Hypertension   . Interstitial lung disease ([name])    NOS  . Iron deficiency anemia   . Nausea & vomiting    2018/2019  . On home oxygen therapy    2 L [place] at bedtime  . Osteoporosis   . Overweight (BMI 25.0-29.9)    BMI 29  . Peripheral neuropathy   . RA (rheumatoid arthritis) ([name])    [name]  . Seropositive rheumatoid arthritis ([place])   . Type II diabetes mellitus ([place])  diet controlled  . Wears glasses   . Wears partial dentures    upper    Family History  Problem Relation Age of Onset  . COPD Mother   . Heart disease Father   . Heart attack Father   . Stomach cancer Brother   . Diabetes Brother   . Colon cancer Brother 25  . Alcohol abuse Sister   . Stroke Sister   . Rectal cancer Neg Hx    Past Surgical History:  Procedure Laterality Date  . BALLOON DILATION N/A 09/12/2018   Procedure: BALLOON DILATION;  Surgeon: Irene Shipper, MD;  Location: [facility];  Service: Endoscopy;  Laterality: N/A;  . BOTOX INJECTION N/A 09/12/2018   Procedure: BOTOX INJECTION;   Surgeon: Irene Shipper, MD;  Location: [facility];  Service: Endoscopy;  Laterality: N/A;  . BOTOX INJECTION N/A 11/27/2018   Procedure: BOTOX INJECTION;  Surgeon: Irene Shipper, MD;  Location: [facility];  Service: Endoscopy;  Laterality: N/A;  . CARDIAC CATHETERIZATION  08/2004   CP- no MI, Cath- small vessell disease   . CARDIAC CATHETERIZATION  12/31/2011   80% distal LM, 100% native LAD, LCx and RCA, 30% prox SVG-OM, SVG-D1 normal, 99% distal, 80% ostial SVG-RCA distal to graft, LIMA-LAD normal; LVEF mildly decreased with posterior basal AK   . CARDIAC CATHETERIZATION  2009   with patent grafts/notes 12/31/2011  . CARDIAC CATHETERIZATION N/A 08/13/2015   Procedure: Left Heart Cath and Cors/Grafts Angiography;  Surgeon: Sherren Mocha, MD;  Location: [facility];  Service: Cardiovascular;  Laterality: N/A;  . CATARACT EXTRACTION W/ INTRAOCULAR LENS  IMPLANT, BILATERAL Bilateral   . CHOLECYSTECTOMY OPEN  11/2003   [name]  . CORONARY ANGIOPLASTY WITH STENT PLACEMENT  01/03/2012   Successful DES to SVG-RCA and cutting balloon angioplasty ostial  PDA   . CORONARY ANGIOPLASTY WITH STENT PLACEMENT  01/14/2014   "1"  . CORONARY ARTERY BYPASS GRAFT  11/1999   CABG X5  . CORONARY STENT PLACEMENT  02/2012   1 stent and balloon  . ESOPHAGEAL DILATION  11/27/2018   Procedure: ESOPHAGEAL DILATION;  Surgeon: Irene Shipper, MD;  Location: [facility];  Service: Endoscopy;;  . ESOPHAGOGASTRODUODENOSCOPY N/A 03/01/2017   Procedure: ESOPHAGOGASTRODUODENOSCOPY (EGD);  Surgeon: Irene Shipper, MD;  Location: [facility];  Service: Endoscopy;  Laterality: N/A;  . ESOPHAGOGASTRODUODENOSCOPY (EGD) WITH ESOPHAGEAL DILATION  2010  . ESOPHAGOGASTRODUODENOSCOPY (EGD) WITH PROPOFOL N/A 09/12/2018   Procedure: ESOPHAGOGASTRODUODENOSCOPY (EGD) WITH PROPOFOL;  Surgeon: Irene Shipper, MD;  [facility]: [facility];  Service: Endoscopy;  Laterality: N/A;  . ESOPHAGOGASTRODUODENOSCOPY (EGD) WITH PROPOFOL N/A  11/27/2018   Procedure: ESOPHAGOGASTRODUODENOSCOPY (EGD) WITH PROPOFOL, WITH BALLOON DILATION;  Surgeon: Irene Shipper, MD;  Location: [facility];  Service: Endoscopy;  Laterality: N/A;  . HAND SURGERY     bilateral carpal tunnel releases  . JOINT REPLACEMENT    . KNEE ARTHROSCOPY Right 2008  . LEFT AND RIGHT HEART CATHETERIZATION WITH CORONARY ANGIOGRAM  12/31/2011   Procedure: LEFT AND RIGHT HEART CATHETERIZATION WITH CORONARY ANGIOGRAM;  Surgeon: Burnell Blanks, MD;  Location: [facility] CATH LAB;  Service: Cardiovascular;;  . LEFT AND RIGHT HEART CATHETERIZATION WITH CORONARY ANGIOGRAM N/A 01/14/2014   Procedure: LEFT AND RIGHT HEART CATHETERIZATION WITH CORONARY ANGIOGRAM;  Surgeon: Peter M Martinique, MD;  Location: [facility] CATH LAB;  Service: Cardiovascular;  Laterality: N/A;  . MALONEY DILATION  03/01/2017   Procedure: Venia Minks DILATION;  Surgeon: Irene Shipper, MD;  Location: [facility];  Service:  Endoscopy;;  . PERCUTANEOUS CORONARY INTERVENTION-BALLOON ONLY  01/03/2012   Procedure: PERCUTANEOUS CORONARY INTERVENTION-BALLOON ONLY;  Surgeon: Peter M Martinique, MD;  Location: [facility] CATH LAB;  Service: Cardiovascular;;  . PERCUTANEOUS CORONARY STENT INTERVENTION (PCI-S)  12/31/2011   Procedure: PERCUTANEOUS CORONARY STENT INTERVENTION (PCI-S);  Surgeon: Burnell Blanks, MD;  Location: [name] CATH LAB;  Service: Cardiovascular;;  . PERCUTANEOUS CORONARY STENT INTERVENTION (PCI-S) N/A 01/03/2012   Procedure: PERCUTANEOUS CORONARY STENT INTERVENTION (PCI-S);  Surgeon: Peter M Martinique, MD;  Location: [facility] CATH LAB;  Service: Cardiovascular;  Laterality: N/A;  . SHOULDER ARTHROSCOPY WITH OPEN ROTATOR CUFF REPAIR AND DISTAL CLAVICLE ACROMINECTOMY Left 02/27/2013   Procedure: LEFT SHOULDER ARTHROSCOPY WITH MINI OPEN ROTATOR CUFF REPAIR AND SUBACROMIAL DECOMPRESSION AND DISTAL CLAVICLE RESECTION;  Surgeon: [name], MD;  Location: [place];  Service: Orthopedics;  Laterality: Left;  . TOTAL KNEE ARTHROPLASTY  Right 03/2010   [name]  . TRIGGER FINGER RELEASE Left 02/27/2013   Procedure: RELEASE TRIGGER FINGER/A-1 PULLEY;  Surgeon: [name], MD;  Location: [place];  Service: Orthopedics;  Laterality: Left;   Social History   Social History Narrative   No living will   Requests wife as health care POA-- alternate is daughter Hassan Rowan   Discussed DNR --he requests this (done 08/29/12)   Not sure about feeding tube---but might accept for some time   Patient lives with wife and daughter in a one story home.  Has 3 children.  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   Immunization History  Administered Date(s) Administered  . H1N1 03/27/2008  . Influenza Split 12/29/2010, 01/18/2012  . Influenza Whole 02/03/2007, 01/09/2008, 01/01/2009, 12/31/2009  . Influenza,inj,Quad PF,6+ Mos 12/13/2012, 01/15/2014, 01/15/2015, 01/06/2016, 01/03/2017, 02/09/2018  . Pneumococcal Conjugate-13 09/10/2013  . Pneumococcal Polysaccharide-23 03/05/1997, 08/17/2011  . Td 03/05/1997, 08/17/2011     Objective: Vital Signs: BP (!) 166/92 (BP Location: Left Arm, Patient Position: Sitting, Cuff Size: Normal)   Pulse 83   Resp 15   Ht 6' (1.829 m)   Wt 193 lb 12.8 oz (87.9 kg)   BMI 26.28 kg/m    Physical Exam Vitals signs and nursing note reviewed.  Constitutional:      Appearance: He is well-developed.  HENT:     Head: Normocephalic and atraumatic.  Eyes:     Conjunctiva/sclera: Conjunctivae normal.     Pupils: Pupils are equal, round, and reactive to light.  Neck:     Musculoskeletal: Normal range of motion and neck supple.  Cardiovascular:     Rate and Rhythm: Normal rate and regular rhythm.     Heart sounds: Normal heart sounds.  Pulmonary:     Effort: Pulmonary effort is normal.     Breath sounds: Normal breath sounds.  Abdominal:     General: Bowel sounds are normal.     Palpations: Abdomen is soft.  Skin:    General: Skin is warm and dry.     Capillary Refill:  Capillary refill takes less than 2 seconds.  Neurological:     Mental Status: He is alert and oriented to person, place, and time.  Psychiatric:        Behavior: Behavior normal.      Musculoskeletal Exam: C-spine limited range of motion.  Thoracic kyphosis noted.  Limited range of motion lumbar spine.  No midline spinal tenderness.  Shoulder joints have good range of motion with some discomfort.  Elbow joints and wrist joints have good range of motion with no tenderness.  He has synovial thickening of MCP joints but no synovitis.  PIP and DIP synovial thickening consistent with osteoarthritis of both hands.  Hip joints have limited range of motion with some discomfort.  Right knee replacement is warm.  Left knee has good range of motion no warmth or effusion.  No tenderness or swelling of ankle joints  CDAI Exam: CDAI Score: - Patient Global: -; Provider Global: - Swollen: -; Tender: - Joint Exam   No joint exam has been documented for this visit   There is currently no information documented on the homunculus. Go to the Rheumatology activity and complete the homunculus joint exam.  Investigation: No additional findings.  Imaging: No results found.  Recent Labs: Lab Results  Component Value Date   WBC 8.3 04/28/2018   HGB 15.1 04/28/2018   PLT 189.0 04/28/2018   NA 136 04/28/2018   K 4.8 04/28/2018   CL 95 (L) 04/28/2018   CO2 34 (H) 04/28/2018   GLUCOSE 305 (H) 04/28/2018   BUN 32 (H) 04/28/2018   CREATININE 2.24 (H) 04/28/2018   BILITOT 0.6 04/28/2018   ALKPHOS 58 04/28/2018   AST 17 04/28/2018   ALT 14 04/28/2018   PROT 8.2 04/28/2018   ALBUMIN 4.1 04/28/2018   ALBUMIN 4.1 04/28/2018   CALCIUM 10.6 (H) 04/28/2018   GFRAA 30 (L) 06/24/2017    Speciality Comments: No specialty comments available.  Procedures:  No procedures performed Allergies: Doxazosin mesylate and Methocarbamol   Assessment / Plan:     Visit Diagnoses: Seropositive rheumatoid arthritis  ([place]) -  RF +, CCP+: He has no synovitis on exam.  He has been having increased pain in both hands for the past several months.  He has PIP and DIP synovial thickening consistent with osteoarthritis of both hands.  Synovial thickening and tenderness of MCP joints but no synovitis was noted.  He is not on any immunosuppressive agents at this time.  He previously took CellCept and Imuran but discontinued due to GI side effects.  Clinically he does not need to be on immunosuppressive agents for the treatment of rheumatoid arthritis at this time.  He continues to go to pain management and is prescribed hydrocodone for pain relief.  He was advised to notify us if he develops increased joint inflammation.  He will follow-up as needed.  ILD (interstitial lung disease) ([place]) - Followed up by Dr. Chase Caller.  He has an upcoming appointment on 01/17/2019.  He denies any new or worsening pulmonary symptoms.  He is not on any immunosuppressive agents at this time.  High risk medication use - D/c Imuran and Cellcept in the past due to GI SE. he has not taking any immunosuppressive agents at this time.  History of right knee joint replacement: Right knee replacement has warmth but no effusion.  Primary osteoarthritis of left knee: Good range of motion with no discomfort.  No warmth or effusion noted.  Spondylosis without myelopathy or radiculopathy, lumbar region: Chronic pain.  He has limited range of motion with discomfort.  No midline spinal tenderness.  No symptoms of radiculopathy at this time.  Other medical conditions are listed as follows;   Chronic diastolic heart failure ([name])  CKD (chronic kidney disease) stage 4, GFR 15-29 ml/min ([name])  History of coronary artery disease  History of diabetes mellitus  History of hyperlipidemia  History of sleep apnea  History of hypertension  History of gastroesophageal reflux (GERD)  History of diverticulosis  Orders: No orders of the defined types  were placed in this encounter.  No orders of the defined types were placed in this encounter.    Follow-Up Instructions: Return if symptoms worsen or fail to improve, for Rheumatoid arthritis, Osteoarthritis, ILD.   Ofilia Neas, PA-C  Note - This record has been created using Dragon software.  Chart creation errors have been sought, but may not always  have been located. Such creation errors do not reflect on  the standard of medical care.

## 2019-01-10 ENCOUNTER — Other Ambulatory Visit: Payer: Self-pay

## 2019-01-10 ENCOUNTER — Encounter: Payer: Self-pay | Admitting: Physician Assistant

## 2019-01-10 ENCOUNTER — Ambulatory Visit (INDEPENDENT_AMBULATORY_CARE_PROVIDER_SITE_OTHER): Payer: Medicare Other | Admitting: Physician Assistant

## 2019-01-10 VITALS — BP 166/92 | HR 83 | Resp 15 | Ht 72.0 in | Wt 193.8 lb

## 2019-01-10 DIAGNOSIS — M059 Rheumatoid arthritis with rheumatoid factor, unspecified: Secondary | ICD-10-CM | POA: Diagnosis not present

## 2019-01-10 DIAGNOSIS — Z79899 Other long term (current) drug therapy: Secondary | ICD-10-CM | POA: Diagnosis not present

## 2019-01-10 DIAGNOSIS — Z8679 Personal history of other diseases of the circulatory system: Secondary | ICD-10-CM

## 2019-01-10 DIAGNOSIS — Z96651 Presence of right artificial knee joint: Secondary | ICD-10-CM | POA: Diagnosis not present

## 2019-01-10 DIAGNOSIS — M1712 Unilateral primary osteoarthritis, left knee: Secondary | ICD-10-CM | POA: Diagnosis not present

## 2019-01-10 DIAGNOSIS — N184 Chronic kidney disease, stage 4 (severe): Secondary | ICD-10-CM

## 2019-01-10 DIAGNOSIS — Z8669 Personal history of other diseases of the nervous system and sense organs: Secondary | ICD-10-CM

## 2019-01-10 DIAGNOSIS — Z8719 Personal history of other diseases of the digestive system: Secondary | ICD-10-CM

## 2019-01-10 DIAGNOSIS — Z8639 Personal history of other endocrine, nutritional and metabolic disease: Secondary | ICD-10-CM

## 2019-01-10 DIAGNOSIS — I5032 Chronic diastolic (congestive) heart failure: Secondary | ICD-10-CM

## 2019-01-10 DIAGNOSIS — M47816 Spondylosis without myelopathy or radiculopathy, lumbar region: Secondary | ICD-10-CM

## 2019-01-10 DIAGNOSIS — J849 Interstitial pulmonary disease, unspecified: Secondary | ICD-10-CM | POA: Diagnosis not present

## 2019-01-16 DIAGNOSIS — J8410 Pulmonary fibrosis, unspecified: Secondary | ICD-10-CM | POA: Diagnosis not present

## 2019-01-16 DIAGNOSIS — R0689 Other abnormalities of breathing: Secondary | ICD-10-CM | POA: Diagnosis not present

## 2019-01-17 ENCOUNTER — Ambulatory Visit: Payer: Medicare Other | Admitting: Internal Medicine

## 2019-01-17 ENCOUNTER — Other Ambulatory Visit: Payer: Self-pay | Admitting: *Deleted

## 2019-01-17 ENCOUNTER — Encounter: Payer: Self-pay | Admitting: Internal Medicine

## 2019-01-17 ENCOUNTER — Other Ambulatory Visit: Payer: Self-pay

## 2019-01-17 VITALS — BP 160/90 | HR 76 | Temp 97.2°F | Ht <= 58 in | Wt 193.4 lb

## 2019-01-17 DIAGNOSIS — J849 Interstitial pulmonary disease, unspecified: Secondary | ICD-10-CM

## 2019-01-17 DIAGNOSIS — R0600 Dyspnea, unspecified: Secondary | ICD-10-CM

## 2019-01-17 NOTE — Patient Outreach (Signed)
[facility]) Care Management  01/17/2019  [name] 1931-11-29 [id number]   Social Work referral received from [facility], Valente David to outreach patient for transportation needs.  Patient has upcoming eye appointments and is unable to drive due to injections. Successful outreach to patient today.  Patient stated that he was able to get transportation to appointment on 02/05/19 but he has appointments every six weeks and is unsure about future transport.  Inquired about transportation benefit through [facility].  Patient does have transportation benefit but has never utilized it.  Provided patient with number for [employer] and informed him that rides typically have to be scheduled at least 3 business days ahead of time.  Closing case at this time.   Ronn Melena, BSW Social Worker [phone]

## 2019-01-17 NOTE — Progress Notes (Signed)
DOB: Apr 26, 1931, 85 y.o.   MRN: [id number]  HPI  85 yo male former smoker with RA-ILD   TEST     - Coronary artery disease. s/p CABG x 5 in 2002.  Dyspnea started following lopressor Jan-July 2009 and lisinopril increae July 2009. No relief despite stopping agents July 2010  -   Myoview on April 17, 2007 was  nonischemic with an EF of 51% and inferobasal wall scar.   - CAth Oct 2009:    - . Severe native three-vessel coronary artery disease. 2. Status post multivessel coronary bypass surgery with all grafts  patent. Normal LVEF and LVEDP - PFT - Mixed pattern on spiro. Mild restn on lung volumeswith near normal DLCO.   - Fev1 2.2L/73%, FVC 3.21L/70%, Ratio 68 (67),  TLC 4.7L/68%, RV 1.5L/55%, DLCO 79%.  -CPST 05/06/2008: Normal effot.    - Reduced VO2 max 20.5/65%, REduced AT 8.2/40%, Normal breathing reservce of 55%, submaximal heart rate response 112/77%,  flattened O2 pulse response at peak exercise - 16m/beat at 85%. No VQ mismatch abnormalities. - ECHO 09/04/2008: LVH, ef 60%, mild AS and unchanged from prior   - CT chest   - Non specific Interstitial Lung disease NOS. Stable pulm infilratates RLL ggo > LLL ggo. 03/2008 -> 10/2008 -> June 2011: stable - Rehab: never attended 2009-2013 due to cost co.  2013 Walking desat test : resting 98/94% -> 3 laps x 185 feet; HR 113/pulse ox 96%    PFTs July 2013 show worsening restriction since 2010 along with reduced diffusion. The RLL baseline GGO might be worse in July 2013 compared to 2011. In addition, autommune profile shows VERY HIGH TITERS of CCP antibody > 300 which is pathognomonic of rheumtoid arthritis lung involvement. RF is only borderline elevated a 23  PFT 10/12/11>>FEV1 2.02 (70%), Fvc 2.8/72%, TLC 4.07 (59%), DLCO 62%, no BD: RESTRICTION - WORSE since 2010 -(2010:  Fev1 2.2L/73%, FVC 3.21L/70%, Ratio 68 (67),  TLC 4.7L/68%, RV 1.5L/55%, DLCO 79%)   Echo 05/04/11>>mild LVH, EF 55 to 663% grade 1 diastolic dysfx, mild AS,  mild MR, PAS 34 mmHg  CT 11/02/11 - calcification suspicious for aortic valve and some GGO esp RLL > LLL (? RLL worse compared to 2011)   LS[place]ang9/27/13: dLM 80%, LAD 100%, circumflex 100%, RCA 100%, proximal SVG-OM 30%, SVG-D1 ok, SVG-RCA 99% distal and 80% ostial PDA distal to graft, LIMA-LAD ok with some left to right collaterals.  PCI 01/03/12: Promus DES to the SVG-RCA and Cutting Balloon angioplasty to the ostial PDA.  Echocardiogram 12/31/11: EF 501-60% grade 1 diastolic dysfunction, mild aortic stenosis, mean gradient 9, mild LAE.   Walking desaturation test on 05/05/2012 185 feet x 3 laps:  did NOT desaturate. Rest pulse ox was 98%, final pulse ox was 96%. HR response 69/min at rest to 82/min at peak exertion.   PFT FVC fev1 ratio BD fev1 TLC DLCO comment intervention  2010 3.2L/70% 2.2L/73%   4.7L/68% 79%    10/12/11 2.8L/72% 2.02L/70% 72  4.07L/59% 62% Worsening restriction since 2010 Rheum will start immuran/pred. Also had PCI in nov 2013  04/28/12 2.6L/59% 1.77L/62% 67  4.0L/59% 15.7/71%     '2015 PFT showed no significant change with FEV1 56% , ratio 66,  FVC 61%.   07/31/2015 Follow up : ILD ? RA related  Pt returns for follow up . Has been have more DOE.  Was set up for a CT chest  PFT on 4/25 showed FEV1 57%,  ratio 71, FVC 58%, no sign BD response , + BD in midflows.  DLCO 40%. (this is decreased from 2015- was 59%.  TLC is similar to 2015 . Has been started on Imuran for RA ILD .  CT chest did not show any sgin change , cont Bilateral LL GGO and bronchiectasis .  Remains on O2 2l/m  At bedtime  . ONO showed desats on RA , O2 was continued.  Has been seen by [place] last year. Now on Imuran, feels Arthritis pain is better.  Says he is feels he is doing okay, gets winded with walking long distances.  Can walk 1/2 block before stopping.  Has dry cough some days.  PVX and Prevnar 13 are utd.   Followed by Dr. Estil Daft in Rheum. Remains on Imuran. Most pain is in his hands.   Denies  chest pain, orthopnea , edema or fever.    OV 12/09/2015   Chief Complaint  Patient presents with  . Follow-up    Feels about the same since the last time we saw him, cough, with some mucus    Follow-up refractory dyspnea in the setting of mild interstitial lung disease from rheumatoid arthritis and coronary artery disease and multifactorial reasons. Refractory to pulmonary rehabilitation.  He did see my nurse practitioner April 2017 and CT chest did not show any changes to his bilateral lower lobe groundglass opacities and bronchiectasis. He is being maintained on Imuran for his rheumatoid arthritis. He is on oxygen 2 L at bedtime.     Had echocardiogram May 2017 that shows elevated pulmonary artery systolic pressure, mild aortic stenosis and diastolic dysfunction. On 08/13/2015 he underwent left heart catheterization by Dr. Sherren Mocha that shows severe native three-vessel coronary artery disease status post bypass with continued patency of the LIMA to LAD and continued patency of the saphenous vein graft to OM with severe stenosis in the proximal body of the graft for which he had successful PCI of a drug-eluting stent treatment of in-stent restenosis. Recommendation is dual antiplatelet therapy lifelong.   He is with his wife currently. He says dyspnea is only some better. He still has significant dyspnea and faitgues more than his wife.   OV 06/07/2016  Chief Complaint  Patient presents with  . Follow-up    Pt feels like his breathing has not been doing good, pt c/o coughing more with greyish colored mucus, more increase sob with exertion, has some chest tightnes, does has some congestion Denies fever   Follow-up refractory dyspnea in the setting of rheumatoid arthritis andand coronary artery disease. Multifactorial dyspnea. On Imuran for rheumatoid arthritis oxygen 2 L at bedtime. He is refractory to pulmonary rehabilitation. He is believed to have some interstitial lung disease  but April 2017 CT chest suggested only mild bronchiectasis  Last seen in September 2017 and at that time as dyspnea with some better after having a cardiac stent. I discussed with his cardiologist about the possibility of having right heart catheterization but it was felt that the elevation in pulmonary artery pressure was not significant enough on the echocardiogram to warrant this. He tells me that he is deteriorated now and this shortness of breath is even worse. He has tried Symbicort in the past without much help. He is willing to try "anything" to help his dyspnea. Walking desaturation test on 06/07/2016 185 feet x 3 laps on RA:  did NOT desaturate. Rest pulse ox was 97%, final pulse ox was 95%. Did only 2 laps and  stopped in interim x 2. . HR response 64/min at rest to 70/min at peak exertion. He says he stopped due to dyspnea and chest pain - like always       OV 08/03/2016  Chief Complaint  Patient presents with  . Follow-up    Pt here after HRCT. Pt states he tried the trelegy and states it helped his breathing. Pt denies cough, CP/tightness, and f/c/s.     Refractory dyspnea in this gentleman with cardiac disease and also mixed obstructive restrictive spirometry due to air trapping and bronchiectasis and some groundglass.  Last visit I tried him on a sample of triple inhaler therapy. He says that this only worked temporarily and stop working. Nevertheless he wants another inhaler. He is frustrated by his dyspnea. He and his wife are making for relief from dyspnea. In 2016 he visited [facility] and they were considering switching his Imuran to CellCept in order to improve his pulmonary parenchymal problems. He is open to that. However he is not sure he needs follow-up with [facility] because Dr Deeann Dowse left the practice there. The suggestion of mild elevation in pulmonary artery systolic pressures and a previous echocardiogram but it was not significant enough to warrant cardiology  to do right heart catheterization. He is willing to have right heart catheterization if recommended. Overall frustrated. Pulm function test does not show any change in 2 years. And CT scan itself in 2018 appears to be stable    IMPRESSION: 1. Stable mild-to-moderate cylindrical and varicoid bronchiectasis in the bilateral dependent lower lobes. 2. Continued stability of parenchymal banding with associated subpleural reticulation and ground-glass attenuation at the areas of bronchiectasis in the dependent and basilar lower lobes, most consistent with postinfectious/postinflammatory scarring. No findings to suggest interstitial lung disease. 3. Stable smooth pleural thickening and calcification in the dependent basilar right pleural space, most compatible with chronic postinflammatory change. No pleural effusions. 4. Stable mild patchy air trapping in both lungs, suggesting small airways disease. 5. Aortic atherosclerosis. Left main and 3 vessel coronary atherosclerosis status post CABG.    OV 01/31/2017  Chief Complaint  Patient presents with  . Follow-up    Pt states that he has been sick x5 months with nausea and vomiting. C/o SOB with exertion, prod. cough with gray mucus, and occ. CP when SOB.    85 year old male with rheumatoid arthritis and interstitial lung disease. He has refractory dyspnea class III on exertion relieved by rest. He also has associated exertional chest pain.at last visit I had his rheumatologist Dr. D evaluate him in switching his Imuran to CellCeptwhich she did in 11/18/2016. He is on low-dose methotrexate was reduced GFR. He is tolerating the medication well. He complains of nausea and vomiting but this preceded the CellCept and Bactrim by 2 months.he is scheduled for an endoscopy in November 2018. He does not change because of the CellCept and Bactrim.Most recent lab 12/26/2016 fairly stable except mild reduction in hemoglobin. Dr. Keturah Barre has referred him back to  primary care physician.he continues to be overwhelmed by his dyspnea and also associated chest pain with exertion. Was recently saw cardiology and had a stable echocardiogram and EKG according to his history. Cardiologist reassured him in his view of the chest pain is not associated with cardiac disease. He therefore is insisting to me that this is because of lung disease. Walking desaturation test 185 feet 3 laps on room air with a forehead probe: Resting pulse ox 99%. Final pulse ox 98%. Resting heart  rate 80/m. Final heart rate 98 a minute. He did get dyspneic with chest pain as always. This no change in his oxygen desaturation test.\  The only changes on the last 3 weeks is complaining of increased cough congestion in his chestand a grayish sputum that is different from from baseline.   OV 04/29/2017  Chief Complaint  Patient presents with  . Follow-up    Pt states his SOB has become worse and coughing with gray mucus. Pt states abx he was placed on last visit did not clear up symptoms. Cellcept and bactrim were both stopped Tuesday, 04/26/17 pt believes.   Follow-up multifactorial refractory dyspnea in the setting of interstitial lung disease related to rheumatoid arthritis  He has been on CellCept for a while associated with Bactrim.  Earlier this week he saw a rheumatologist Dr. Keturah Barre.  He complained of significant nausea.  She called me we spoke.  Because of lack of improvement with dyspnea with the CellCept regimen recommended by [facility].  We decided to discontinue this.  At the same time was having nausea and there was concern this was related to CellCept/Bactrim.  Today he tells me that since stopping CellCept/Bactrim nausea and vomiting have significantly improved.  He still feels fatigued.  His cardiologist now has it on a monitor because of his refractory dyspnea.  He is frustrated by his dyspnea which he says is exertional and associated with central chest pain.  There are no other  new issues.  Recent review of the labs show that he has been anemic early in January 2019 and his chronic kidney disease is getting worse.  The status of these as of today is not known.  Next blood work is only in a month from now/   Walking desaturation test on 04/29/2017 185 feet x 3 laps on ROOM AIR:  did NOT desaturate. Rest pulse ox was 98%, final pulse ox was 95%. HR response 69/min at rest to 94/min at peak exertion. Patient MARQUES ERICSON  Did not Desaturate < 88% . [name] yes did  Desaturated </= 3% points. [name] yes did get tachyardic. DID get dyspneic with central chest pain - just like before   OV 08/01/2017    Chief Complaint  Patient presents with  . Follow-up    Pt states he is the same as he was at the last visit, maybe some better. Pt still becomes SOB with exertion, some mild coughing with gray mucus, and occ. chest tightness.   Follow-up multifactorial refractory dyspnea in the setting of interstitial lung disease related to rheumatoid arthritis   85 year old male with rheumatoid arthritis interstitial lung disease and also severe coronary artery disease on medical management.  He is here for follow-up.  There is a 72-monthfollow-up.  In the interim his anemia has [facility].  His exertional chest pain is somewhat better and his dyspnea somewhat better but he still does have baseline exertional dyspnea and chest pain that is refractory to any treatment.  He is no longer on CellCept or Bactrim for his interstitial lung disease after developing nausea.  He has been off immune modulator treatment for 3 months.  He did see Dr. D rheumatologist end of March 2019 and she was wondering about restarting his old Imuran.  At this point in time he is still going through anemia work-up.  He has a colonoscopy upcoming.  He will have  Plavix on hold prior to the colonoscopy.  At this point in time his walking  desaturation test is stable and documented below.  There are no other new issues.  Walking desaturation test on 08/01/2017 185 feet x 3 laps on ROOM AIR:  did not desaturate. Rest pulse ox was 98%, final pulse ox was 98%. HR response 67/min at rest to 86/min at peak exertion. Patient WOODIE DEGRAFFENREID  Did not Desaturate < 88% . [name] did not  Desaturated </= 3% points. [name] did not get tachyardic.  DID get dyspneic with central chest pain - just like before   Results for ULUS, HAZEN (MRN [id number]) as of 08/01/2017 11:25  Ref. Range 06/24/2017 11:36  Creatinine Latest Ref Range: 0.70 - 1.11 mg/dL 2.28 (H)    Results for JOMARION, MISH (MRN [id number]) as of 08/01/2017 11:25  Ref. Range 06/24/2017 11:36  WBC Latest Ref Range: 3.8 - 10.8 Thousand/uL 7.8   OV 01/17/2019  Subjective:  Patient ID: AESON SAWYERS, male , DOB: 1931/11/25 , age 46 y.o. , MRN: [id number] , ADDRESS: [address] [place] [address] Follow-up multifactorial refractory dyspnea in the setting of interstitial lung disease related to rheumatoid arthritis  01/17/2019 -   Chief Complaint  Patient presents with  . ILD (interstitial lung disease)    Feels breathing has not improved. Will take a couple minutes for breathing to get back to normal if he gets short of breath for the last six months.     HPI DESI ROWE 85 y.o. -follow-up multifactorial dyspnea setting of interstitial lung disease secondary to rheumatoid arthritis.  Also refractory dyspnea.  Last seen in April 2019.  Since then have not seen him.  At the last visit we recommended he go back on Imuran but review of the rheumatology notes from January 10, 2019 looks like he is not on any immunosuppressive's.  This because he said previous side effects of the GI tract from both Imuran and CellCept.  With the onset of the pandemic he said he is not eating at restaurants anymore and therefore he had and his wife have lost a lot of  weight.  Despite this loss of weight his shortness of breath he says is worse.  Of note every visit he comes he has progressive dyspnea.  The symptom score itself shows the dyspnea is not bad.  He continues to use 2 L of nasal cannula oxygen at night.  Last CT scan of the chest was in 2018 and last PFT was in 2017.      SYMPTOM SCALE - ILD 01/17/2019   O2 use  only 2 L at night  Shortness of Breath 0 -> 5 scale with 5 being worst (score 6 If unable to do)  At rest 0  Simple tasks - showers, clothes change, eating, shaving 2.5  Household (dishes, doing bed, laundry) 3  Shopping 2  Walking level at own pace 2  Walking keeping up with others of same age 79.5  Walking up Stairs 4  Walking up [place] 4  Total (40 - 48) Dyspnea Score 21  How bad is your cough? 0  How bad is your fatigue 0    Results for TREYDON, HENRICKS (MRN [id number]) as of 01/17/2019 12:01  Ref. Range 09/18/2013 11:28 07/29/2015 11:12  FVC-Pre Latest Units: L 2.57 2.52  FVC-%Pred-Pre Latest Units: % 61 56    Results for JACUB, WAITERS (MRN [id number]) as of  01/17/2019 12:01  Ref. Range 09/18/2013 11:28 07/29/2015 11:12  DLCO unc Latest Units: ml/min/mmHg 19.91 14.87  DLCO unc % pred Latest Units: % 59 40  IMPRESSION: 1. Stable mild-to-moderate cylindrical and varicoid bronchiectasis in the bilateral dependent lower lobes. 2. Continued stability of parenchymal banding with associated subpleural reticulation and ground-glass attenuation at the areas of bronchiectasis in the dependent and basilar lower lobes, most consistent with postinfectious/postinflammatory scarring. No findings to suggest interstitial lung disease. 3. Stable smooth pleural thickening and calcification in the dependent basilar right pleural space, most compatible with chronic postinflammatory change. No pleural effusions. 4. Stable mild patchy air trapping in both lungs, suggesting small airways disease. 5. Aortic atherosclerosis. Left main and 3  vessel coronary atherosclerosis status post CABG.   Electronically Signed   By: Ilona Sorrel M.D.   On: 07/08/2016 14:11  IMPRESSION: 1. Stable mild-to-moderate cylindrical and varicoid bronchiectasis in the bilateral dependent lower lobes. 2. Continued stability of parenchymal banding with associated subpleural reticulation and ground-glass attenuation at the areas of bronchiectasis in the dependent and basilar lower lobes, most consistent with postinfectious/postinflammatory scarring. No findings to suggest interstitial lung disease. 3. Stable smooth pleural thickening and calcification in the dependent basilar right pleural space, most compatible with chronic postinflammatory change. No pleural effusions. 4. Stable mild patchy air trapping in both lungs, suggesting small airways disease. 5. Aortic atherosclerosis. Left main and 3 vessel coronary atherosclerosis status post CABG.   Electronically Signed   By: Ilona Sorrel M.D.   On: 07/08/2016 14:11  ROS - per HPI  Results for RAYAN, INES (MRN [id number]) as of 01/17/2019 12:01  Ref. Range 04/28/2018 12:28  Hemoglobin Latest Ref Range: 13.0 - 17.0 g/dL 15.1   Results for ANTONYO, HINDERER (MRN [id number]) as of 01/17/2019 12:01  Ref. Range 04/28/2018 12:28  Creatinine Latest Ref Range: 0.40 - 1.50 mg/dL 2.24 (H)    has a past medical history of Arthritis, CAD (coronary artery disease), Cataract, Chronic diastolic CHF (congestive heart failure) ([place]), Chronic kidney disease, stage III (moderate), Chronic lower back pain, Colon polyps, Diverticulosis, Dyspnea (2009 since July -Sept), Enlarged prostate, Esophageal stricture, GERD (gastroesophageal reflux disease), Heart murmur, Hiatal hernia, History of carpal tunnel syndrome, History of kidney stones, History of PFTs, Hyperlipidemia, Hypertension, Interstitial lung disease ([place]), Iron deficiency anemia, Nausea & vomiting, On home oxygen therapy, Osteoporosis, Overweight  (BMI 25.0-29.9), Peripheral neuropathy, RA (rheumatoid arthritis) ([place]), Seropositive rheumatoid arthritis ([place]), Type II diabetes mellitus ([place]), Wears glasses, and Wears partial dentures.   reports that he quit smoking about 55 years ago. His smoking use included cigarettes. He has a 20.00 pack-year smoking history. He has never used smokeless tobacco.  Past Surgical History:  Procedure Laterality Date  . BALLOON DILATION N/A 09/12/2018   Procedure: BALLOON DILATION;  Surgeon: Irene Shipper, MD;  Location: [facility];  Service: Endoscopy;  Laterality: N/A;  . BOTOX INJECTION N/A 09/12/2018   Procedure: BOTOX INJECTION;  Surgeon: Irene Shipper, MD;  Location: [facility];  Service: Endoscopy;  Laterality: N/A;  . BOTOX INJECTION N/A 11/27/2018   Procedure: BOTOX INJECTION;  Surgeon: Irene Shipper, MD;  Location: [facility];  Service: Endoscopy;  Laterality: N/A;  . CARDIAC CATHETERIZATION  08/2004   CP- no MI, Cath- small vessell disease   . CARDIAC CATHETERIZATION  12/31/2011   80% distal LM, 100% native LAD, LCx and RCA, 30% prox SVG-OM, SVG-D1 normal, 99% distal, 80% ostial SVG-RCA distal to graft, LIMA-LAD normal; LVEF mildly decreased with posterior  basal AK   . CARDIAC CATHETERIZATION  2009   with patent grafts/notes 12/31/2011  . CARDIAC CATHETERIZATION N/A 08/13/2015   Procedure: Left Heart Cath and Cors/Grafts Angiography;  Surgeon: Sherren Mocha, MD;  Location: [facility];  Service: Cardiovascular;  Laterality: N/A;  . CATARACT EXTRACTION W/ INTRAOCULAR LENS  IMPLANT, BILATERAL Bilateral   . CHOLECYSTECTOMY OPEN  11/2003   [name]  . CORONARY ANGIOPLASTY WITH STENT PLACEMENT  01/03/2012   Successful DES to SVG-RCA and cutting balloon angioplasty ostial  PDA   . CORONARY ANGIOPLASTY WITH STENT PLACEMENT  01/14/2014   "1"  . CORONARY ARTERY BYPASS GRAFT  11/1999   CABG X5  . CORONARY STENT PLACEMENT  02/2012   1 stent and balloon  . ESOPHAGEAL DILATION  11/27/2018    Procedure: ESOPHAGEAL DILATION;  Surgeon: Irene Shipper, MD;  Location: [facility];  Service: Endoscopy;;  . ESOPHAGOGASTRODUODENOSCOPY N/A 03/01/2017   Procedure: ESOPHAGOGASTRODUODENOSCOPY (EGD);  Surgeon: Irene Shipper, MD;  Location: [facility];  Service: Endoscopy;  Laterality: N/A;  . ESOPHAGOGASTRODUODENOSCOPY (EGD) WITH ESOPHAGEAL DILATION  2010  . ESOPHAGOGASTRODUODENOSCOPY (EGD) WITH PROPOFOL N/A 09/12/2018   Procedure: ESOPHAGOGASTRODUODENOSCOPY (EGD) WITH PROPOFOL;  Surgeon: Irene Shipper, MD;  Location: [facility];  Service: Endoscopy;  Laterality: N/A;  . ESOPHAGOGASTRODUODENOSCOPY (EGD) WITH PROPOFOL N/A 11/27/2018   Procedure: ESOPHAGOGASTRODUODENOSCOPY (EGD) WITH PROPOFOL, WITH BALLOON DILATION;  Surgeon: Irene Shipper, MD;  Location: [facility];  Service: Endoscopy;  Laterality: N/A;  . HAND SURGERY     bilateral carpal tunnel releases  . JOINT REPLACEMENT    . KNEE ARTHROSCOPY Right 2008  . LEFT AND RIGHT HEART CATHETERIZATION WITH CORONARY ANGIOGRAM  12/31/2011   Procedure: LEFT AND RIGHT HEART CATHETERIZATION WITH CORONARY ANGIOGRAM;  Surgeon: Burnell Blanks, MD;  Location: [facility] C[facility] LAB;  Service: Cardiovascular;;  . LEFT AND RIGHT HEART CATHETERIZATION WITH CORONARY ANGIOGRAM N/A 01/14/2014   Procedure: LEFT AND RIGHT HEART CATHETERIZATION WITH CORONARY ANGIOGRAM;  Surgeon: Peter M Martinique, MD;  Location: [facility] CATH LAB;  Service: Cardiovascular;  Laterality: N/A;  . MALONEY DILATION  03/01/2017   Procedure: Venia Minks DILATION;  Surgeon: Irene Shipper, MD;  Location: [facility];  Service: Endoscopy;;  . PERCUTANEOUS CORONARY INTERVENTION-BALLOON ONLY  01/03/2012   Procedure: PERCUTANEOUS CORONARY INTERVENTION-BALLOON ONLY;  Surgeon: Peter M Martinique, MD;  Location: [facility] CATH LAB;  Service: Cardiovascular;;  . PERCUTANEOUS CORONARY STENT INTERVENTION (PCI-S)  12/31/2011   Procedure: PERCUTANEOUS CORONARY STENT INTERVENTION (PCI-S);  Surgeon: Burnell Blanks, MD;   Location: [facility] CATH LAB;  Service: Cardiovascular;;  . PERCUTANEOUS CORONARY STENT INTERVENTION (PCI-S) N/A 01/03/2012   Procedure: PERCUTANEOUS CORONARY STENT INTERVENTION (PCI-S);  Surgeon: Peter M Martinique, MD;  Location: [facility] CATH LAB;  Service: Cardiovascular;  Laterality: N/A;  . SHOULDER ARTHROSCOPY WITH OPEN ROTATOR CUFF REPAIR AND DISTAL CLAVICLE ACROMINECTOMY Left 02/27/2013   Procedure: LEFT SHOULDER ARTHROSCOPY WITH MINI OPEN ROTATOR CUFF REPAIR AND SUBACROMIAL DECOMPRESSION AND DISTAL CLAVICLE RESECTION;  Surgeon: [name], MD;  Location: [facility];  Service: Orthopedics;  Laterality: Left;  . TOTAL KNEE ARTHROPLASTY Right 03/2010   [name]  . TRIGGER FINGER RELEASE Left 02/27/2013   Procedure: RELEASE TRIGGER FINGER/A-1 PULLEY;  Surgeon: [name], MD;  Location: [facility];  Service: Orthopedics;  Laterality: Left;    Allergies  Allergen Reactions  . Doxazosin Mesylate Other (See Comments)    dizziness  . Methocarbamol Rash    Immunization History  Administered Date(s) Administered  . H1N1 03/27/2008  .  Influenza Split 12/29/2010, 01/18/2012  . Influenza Whole 02/03/2007, 01/09/2008, 01/01/2009, 12/31/2009  . Influenza,inj,Quad PF,6+ Mos 12/13/2012, 01/15/2014, 01/15/2015, 01/06/2016, 01/03/2017, 02/09/2018  . Pneumococcal Conjugate-13 09/10/2013  . Pneumococcal Polysaccharide-23 03/05/1997, 08/17/2011  . Td 03/05/1997, 08/17/2011    Family History  Problem Relation Age of Onset  . COPD Mother   . Heart disease Father   . Heart attack Father   . Stomach cancer Brother   . Diabetes Brother   . Colon cancer Brother 25  . Alcohol abuse Sister   . Stroke Sister   . Rectal cancer Neg Hx      Current Outpatient Medications:  .  aspirin EC 81 MG tablet, Take 81 mg by mouth at bedtime., Disp: , Rfl:  .  Blood Glucose Monitoring Suppl (ONE TOUCH ULTRA 2) w/Device KIT, Use to obtain blood sugar daily. Dx Code E11.40, Disp: 1 each, Rfl: 0 .  carvedilol (COREG)  3.125 MG tablet, TAKE 1 TABLET BY MOUTH TWICE DAILY WITH MEALS (Patient taking differently: Take 3.125 mg by mouth 2 (two) times daily with a meal. ), Disp: 180 tablet, Rfl: 3 .  clopidogrel (PLAVIX) 75 MG tablet, TAKE 1 TABLET BY MOUTH ONCE A DAY WITH BREAKFAST. (Patient taking differently: Take 75 mg by mouth daily. ), Disp: 90 tablet, Rfl: 0 .  DHA-Vitamin C-Lutein (EYE HEALTH FORMULA PO), Take 1 tablet by mouth daily. , Disp: , Rfl:  .  furosemide (LASIX) 40 MG tablet, Take 40 mg by mouth at bedtime. Monday, Wednesday, Friday., Disp: , Rfl:  .  glucose blood (ONE TOUCH ULTRA TEST) test strip, USE TO CHECK BLOOD SUGAR ONCE A DAY Dx Code E11.40, Disp: 100 each, Rfl: 3 .  HYDROcodone-acetaminophen (NORCO) 10-325 MG tablet, Take 1 tablet by mouth every 6 (six) hours as needed for severe pain. Must last 30 days., Disp: 120 tablet, Rfl: 0 .  nitroGLYCERIN (NITROSTAT) 0.4 MG SL tablet, DISSOLVE 1 TABLET UNDER TONGUE AS NEEDEDFOR CHEST PAIN. MAY REPEAT 5 MINUTES APART 3 TIMES IF NEEDED. IF NO RELIEF CALL 911 (Patient taking differently: Place under the tongue every 5 (five) minutes as needed for chest pain. IF NO RELIEF CALL 911), Disp: 25 tablet, Rfl: 1 .  ONETOUCH DELICA LANCETS 24M MISC, 1 each by In Vitro route daily. Dx Code E11.49, Disp: 100 each, Rfl: 3 .  OXYGEN, Inhale into the lungs. Per pt- Uses Oxygen 2 liters at night., Disp: , Rfl:  .  pantoprazole (PROTONIX) 40 MG tablet, TAKE 1 TABLET BY MOUTH TWICE (2) DAILY (Patient taking differently: Take 40 mg by mouth. ), Disp: 180 tablet, Rfl: 3 .  polyethylene glycol (MIRALAX / GLYCOLAX) 17 g packet, Take 51 g by mouth daily. , Disp: , Rfl:       Objective:   Vitals:   01/17/19 1144  BP: (!) 160/90  Pulse: 76  Temp: (!) 97.2 F (36.2 C)  SpO2: 96%  Weight: 193 lb 6.4 oz (87.7 kg)  Height: (!) 6" (0.152 m)    Estimated body mass index is 3,777.08 kg/m as calculated from the following:   Height as of this encounter: 6" (0.152 m).    Weight as of this encounter: 193 lb 6.4 oz (87.7 kg).  '@WEIGHTCHANGE'$ @  [employer]   01/17/19 1144  Weight: 193 lb 6.4 oz (87.7 kg)     Physical Exam  General Appearance:    Alert, cooperative, no distress, appears stated age - yes , Deconditioned looking - no , OBESE  - no, Sitting  on Wheelchair -  no  Head:    Normocephalic, without obvious abnormality, atraumatic  Eyes:    PERRL, conjunctiva/corneas clear,  Ears:    Normal TM's and external ear canals, both ears  Nose:   Nares normal, septum midline, mucosa normal, no drainage    or sinus tenderness. OXYGEN ON  - no . Patient is @ ra   Throat:   Lips, mucosa, and tongue normal; teeth and gums normal. Cyanosis on lips - no  Neck:   Supple, symmetrical, trachea midline, no adenopathy;    thyroid:  no enlargement/tenderness/nodules; no carotid   bruit or JVD  Back:     Symmetric, no curvature, ROM normal, no CVA tenderness  Lungs:     Distress - no , Wheeze no, Barrell Chest - no, Purse lip breathing - no, Crackles - yes at base   Chest Wall:    No tenderness or deformity.    Heart:    Regular rate and rhythm, S1 and S2 normal, no rub   or gallop, Murmur - no  Breast Exam:    NOT DONE  Abdomen:     Soft, non-tender, bowel sounds active all four quadrants,    no masses, no organomegaly. Visceral obesity - yes  Genitalia:   NOT DONE  Rectal:   NOT DONE  Extremities:   Extremities - normal, Has Cane - no, Clubbing - YES, Edema - no  Pulses:   2+ and symmetric all extremities  Skin:   Stigmata of Connective Tissue Disease - ? Of RA  Lymph nodes:   Cervical, supraclavicular, and axillary nodes normal  Psychiatric:  Neurologic:   Pleasant - yes, Anxious - no, Flat affect - yes  CAm-ICU - neg, Alert and Oriented x 3 - yes, Moves all 4s - yes, Speech - normal, Cognition - intact           Assessment:       ICD-10-CM   1. ILD (interstitial lung disease) ([place])  J84.9 Pulmonary Function Test  2. Dyspnea, unspecified type   R06.00        Plan:     Patient Instructions     ICD-10-CM   1. ILD (interstitial lung disease) ([place]) J84.9   2. Dyspnea, unspecified type R06.00     Not sure why shortness of breath is worse even after dietary weight loss and improvement in anemia Need to re-look if ILD is worse - if so there might be anti-fibrotic option available Your BP was 160/90 01/17/2019   Plan Do HRCT next few to several weeks Do spirometry and dlco - next few to several weeks Cotninue o2 2L [place] at night  Followup - next few to several weeks for reviwe; 30 min slot - after completing above tests -Do simple walk test at follow-up     SIGNATURE    Dr. Brand Males, M.D., F.C.C.P,  Pulmonary and Critical Care Medicine Staff Physician, [facility] Director - Interstitial Lung Disease  Program  P[facility] [place], [place], [address]  Pager: [phone], If no answer or between  15:00h - 7:00h: call [phone] Telephone: [phone]  12:14 PM 01/17/2019

## 2019-01-17 NOTE — Patient Outreach (Signed)
[facility]) Care Management  01/17/2019  [name] 08-14-1931 [id number]   Referral received from care management assistant as member self referred to the program.  Per chart, assessment was done in August for needs/resources but member denied at the time.  Per chart, he has history of hypertension, CHF, GERD, and controlled diabetes.  Call placed to member, identity verified.  This care manager introduced self and stated purpose of call.  He report he is doing well, reached back out to [facility] for assistance with transportation to eye appointments.  State he will be having injections about every 6 weeks and will not be able to drive himself.  State he lives with his wife, able to care for self independently.  Denies any additional needs.  Will place referral to social worker, will not open to nursing.  [name], [place], MSN [place] [phone]

## 2019-01-17 NOTE — Patient Instructions (Addendum)
ICD-10-CM   1. ILD (interstitial lung disease) ([place]) J84.9   2. Dyspnea, unspecified type R06.00     Not sure why shortness of breath is worse even after dietary weight loss and improvement in anemia Need to re-look if ILD is worse - if so there might be anti-fibrotic option available Your BP was 160/90 01/17/2019   Plan Do HRCT next few to several weeks Do spirometry and dlco - next few to several weeks Cotninue o2 2L [place] at night  Followup - next few to several weeks for reviwe; 30 min slot - after completing above tests -Do simple walk test at follow-up

## 2019-01-23 ENCOUNTER — Other Ambulatory Visit (HOSPITAL_COMMUNITY): Payer: Medicare Other

## 2019-01-26 ENCOUNTER — Other Ambulatory Visit: Payer: Self-pay

## 2019-01-26 ENCOUNTER — Inpatient Hospital Stay (HOSPITAL_COMMUNITY): Admission: RE | Admit: 2019-01-26 | Payer: Medicare Other | Source: Ambulatory Visit

## 2019-01-26 DIAGNOSIS — Z20822 Contact with and (suspected) exposure to covid-19: Secondary | ICD-10-CM

## 2019-01-27 LAB — NOVEL CORONAVIRUS, NAA: SARS-CoV-2, NAA: NOT DETECTED

## 2019-01-29 ENCOUNTER — Ambulatory Visit (HOSPITAL_COMMUNITY)
Admission: RE | Admit: 2019-01-29 | Discharge: 2019-01-29 | Disposition: A | Discharge status: Home / Self Care | Payer: Medicare Other | Source: Ambulatory Visit | Attending: Internal Medicine | Admitting: Internal Medicine

## 2019-01-29 ENCOUNTER — Other Ambulatory Visit: Payer: Self-pay

## 2019-01-29 DIAGNOSIS — J849 Interstitial pulmonary disease, unspecified: Secondary | ICD-10-CM | POA: Diagnosis not present

## 2019-01-29 LAB — PULMONARY FUNCTION TEST
DL/VA % pred: 118 %
DL/VA: 4.5 ml/min/mmHg/L
DLCO unc % pred: 73 %
DLCO unc: 18.48 ml/min/mmHg
FEF 25-75 Post: 1 L/sec
FEF 25-75 Pre: 0.84 L/sec
FEF2575-%Change-Post: 18 %
FEF2575-%Pred-Post: 52 %
FEF2575-%Pred-Pre: 44 %
FEV1-%Change-Post: 5 %
FEV1-%Pred-Post: 59 %
FEV1-%Pred-Pre: 56 %
FEV1-Post: 1.74 L
FEV1-Pre: 1.64 L
FEV1FVC-%Change-Post: 3 %
FEV1FVC-%Pred-Pre: 94 %
FEV6-%Change-Post: 4 %
FEV6-%Pred-Post: 63 %
FEV6-%Pred-Pre: 60 %
FEV6-Post: 2.45 L
FEV6-Pre: 2.34 L
FEV6FVC-%Change-Post: 1 %
FEV6FVC-%Pred-Post: 104 %
FEV6FVC-%Pred-Pre: 102 %
FVC-%Change-Post: 2 %
FVC-%Pred-Post: 60 %
FVC-%Pred-Pre: 59 %
FVC-Post: 2.51 L
FVC-Pre: 2.46 L
Post FEV1/FVC ratio: 69 %
Post FEV6/FVC ratio: 98 %
Pre FEV1/FVC ratio: 67 %
Pre FEV6/FVC Ratio: 96 %
RV % pred: 104 %
RV: 2.99 L
TLC % pred: 73 %
TLC: 5.48 L

## 2019-01-29 MED ORDER — ALBUTEROL SULFATE (2.5 MG/3ML) 0.083% IN NEBU
2.5000 mg | INHALATION_SOLUTION | Freq: Once | RESPIRATORY_TRACT | Status: AC
  Administered 2019-01-29: 2.5 mg via RESPIRATORY_TRACT

## 2019-01-30 ENCOUNTER — Ambulatory Visit (INDEPENDENT_AMBULATORY_CARE_PROVIDER_SITE_OTHER): Payer: Medicare Other

## 2019-01-30 DIAGNOSIS — Z23 Encounter for immunization: Secondary | ICD-10-CM

## 2019-01-31 ENCOUNTER — Other Ambulatory Visit: Payer: Self-pay | Admitting: Cardiovascular Disease

## 2019-02-05 ENCOUNTER — Other Ambulatory Visit (HOSPITAL_COMMUNITY): Payer: Medicare Other

## 2019-02-05 DIAGNOSIS — H43813 Vitreous degeneration, bilateral: Secondary | ICD-10-CM | POA: Diagnosis not present

## 2019-02-05 DIAGNOSIS — H353221 Exudative age-related macular degeneration, left eye, with active choroidal neovascularization: Secondary | ICD-10-CM | POA: Diagnosis not present

## 2019-02-05 DIAGNOSIS — H353213 Exudative age-related macular degeneration, right eye, with inactive scar: Secondary | ICD-10-CM | POA: Diagnosis not present

## 2019-02-05 DIAGNOSIS — H35372 Puckering of macula, left eye: Secondary | ICD-10-CM | POA: Diagnosis not present

## 2019-02-05 DIAGNOSIS — N184 Chronic kidney disease, stage 4 (severe): Secondary | ICD-10-CM | POA: Diagnosis not present

## 2019-02-06 ENCOUNTER — Other Ambulatory Visit: Payer: Self-pay

## 2019-02-06 ENCOUNTER — Ambulatory Visit (INDEPENDENT_AMBULATORY_CARE_PROVIDER_SITE_OTHER)
Admission: RE | Admit: 2019-02-06 | Discharge: 2019-02-06 | Disposition: A | Discharge status: Home / Self Care | Payer: Medicare Other | Source: Ambulatory Visit | Attending: Internal Medicine | Admitting: Internal Medicine

## 2019-02-06 DIAGNOSIS — J849 Interstitial pulmonary disease, unspecified: Secondary | ICD-10-CM

## 2019-02-06 DIAGNOSIS — R0602 Shortness of breath: Secondary | ICD-10-CM | POA: Diagnosis not present

## 2019-02-12 ENCOUNTER — Encounter: Payer: Self-pay | Admitting: Internal Medicine

## 2019-02-12 ENCOUNTER — Ambulatory Visit (INDEPENDENT_AMBULATORY_CARE_PROVIDER_SITE_OTHER): Payer: Medicare Other | Admitting: Internal Medicine

## 2019-02-12 ENCOUNTER — Other Ambulatory Visit: Payer: Self-pay

## 2019-02-12 VITALS — BP 128/74 | HR 60 | Ht 71.0 in | Wt 194.8 lb

## 2019-02-12 DIAGNOSIS — J849 Interstitial pulmonary disease, unspecified: Secondary | ICD-10-CM | POA: Diagnosis not present

## 2019-02-12 DIAGNOSIS — Z961 Presence of intraocular lens: Secondary | ICD-10-CM | POA: Diagnosis not present

## 2019-02-12 DIAGNOSIS — H524 Presbyopia: Secondary | ICD-10-CM | POA: Diagnosis not present

## 2019-02-12 DIAGNOSIS — R0600 Dyspnea, unspecified: Secondary | ICD-10-CM | POA: Diagnosis not present

## 2019-02-12 DIAGNOSIS — E119 Type 2 diabetes mellitus without complications: Secondary | ICD-10-CM | POA: Diagnosis not present

## 2019-02-12 DIAGNOSIS — H353213 Exudative age-related macular degeneration, right eye, with inactive scar: Secondary | ICD-10-CM | POA: Diagnosis not present

## 2019-02-12 DIAGNOSIS — H353121 Nonexudative age-related macular degeneration, left eye, early dry stage: Secondary | ICD-10-CM | POA: Diagnosis not present

## 2019-02-12 LAB — HM DIABETES EYE EXAM

## 2019-02-12 NOTE — Progress Notes (Signed)
DOB: 03-17-1932, 85 y.o.   MRN: [id number]  HPI  85 yo male former smoker with RA-ILD   TEST     - Coronary artery disease. s/p CABG x 5 in 2002.  Dyspnea started following lopressor Jan-July 2009 and lisinopril increae July 2009. No relief despite stopping agents July 2010  -   Myoview on April 17, 2007 was  nonischemic with an EF of 51% and inferobasal wall scar.   - CAth Oct 2009:    - . Severe native three-vessel coronary artery disease. 2. Status post multivessel coronary bypass surgery with all grafts  patent. Normal LVEF and LVEDP - PFT - Mixed pattern on spiro. Mild restn on lung volumeswith near normal DLCO.   - Fev1 2.2L/73%, FVC 3.21L/70%, Ratio 68 (67),  TLC 4.7L/68%, RV 1.5L/55%, DLCO 79%.  -CPST 05/06/2008: Normal effot.    - Reduced VO2 max 20.5/65%, REduced AT 8.2/40%, Normal breathing reservce of 55%, submaximal heart rate response 112/77%,  flattened O2 pulse response at peak exercise - 71m/beat at 85%. No VQ mismatch abnormalities. - ECHO 09/04/2008: LVH, ef 60%, mild AS and unchanged from prior   - CT chest   - Non specific Interstitial Lung disease NOS. Stable pulm infilratates RLL ggo > LLL ggo. 03/2008 -> 10/2008 -> June 2011: stable - Rehab: never attended 2009-2013 due to cost co.  2013 Walking desat test : resting 98/94% -> 3 laps x 185 feet; HR 113/pulse ox 96%    PFTs July 2013 show worsening restriction since 2010 along with reduced diffusion. The RLL baseline GGO might be worse in July 2013 compared to 2011. In addition, autommune profile shows VERY HIGH TITERS of CCP antibody > 300 which is pathognomonic of rheumtoid arthritis lung involvement. RF is only borderline elevated a 23  PFT 10/12/11>>FEV1 2.02 (70%), Fvc 2.8/72%, TLC 4.07 (59%), DLCO 62%, no BD: RESTRICTION - WORSE since 2010 -(2010:  Fev1 2.2L/73%, FVC 3.21L/70%, Ratio 68 (67),  TLC 4.7L/68%, RV 1.5L/55%, DLCO 79%)   Echo 05/04/11>>mild LVH, EF 55 to 667% grade 1 diastolic dysfx, mild AS,  mild MR, PAS 34 mmHg  CT 11/02/11 - calcification suspicious for aortic valve and some GGO esp RLL > LLL (? RLL worse compared to 2011)   [place]: dLM 80%, LAD 100%, circumflex 100%, RCA 100%, proximal SVG-OM 30%, SVG-D1 ok, SVG-RCA 99% distal and 80% ostial PDA distal to graft, LIMA-LAD ok with some left to right collaterals.  PCI 01/03/12: Promus DES to the SVG-RCA and Cutting Balloon angioplasty to the ostial PDA.  Echocardiogram 12/31/11: EF 534-19% grade 1 diastolic dysfunction, mild aortic stenosis, mean gradient 9, mild LAE.   Walking desaturation test on 05/05/2012 185 feet x 3 laps:  did NOT desaturate. Rest pulse ox was 98%, final pulse ox was 96%. HR response 69/min at rest to 82/min at peak exertion.   PFT FVC fev1 ratio BD fev1 TLC DLCO comment intervention  2010 3.2L/70% 2.2L/73%   4.7L/68% 79%    10/12/11 2.8L/72% 2.02L/70% 72  4.07L/59% 62% Worsening restriction since 2010 Rheum will start immuran/pred. Also had PCI in nov 2013  04/28/12 2.6L/59% 1.77L/62% 67  4.0L/59% 15.7/71%     '2015 PFT showed no significant change with FEV1 56% , ratio 66,  FVC 61%.   07/31/2015 Follow up : ILD ? RA related  Pt returns for follow up . Has been have more DOE.  Was set up for a CT chest  PFT on 4/25 showed FEV1  57%, ratio 71, FVC 58%, no sign BD response , + BD in midflows.  DLCO 40%. (this is decreased from 2015- was 59%.  TLC is similar to 2015 . Has been started on Imuran for RA ILD .  CT chest did not show any sgin change , cont Bilateral LL GGO and bronchiectasis .  Remains on O2 2l/m  At bedtime  . ONO showed desats on RA , O2 was continued.  Has been seen by [name] last year. Now on Imuran, feels Arthritis pain is better.  Says he is feels he is doing okay, gets winded with walking long distances.  Can walk 1/2 block before stopping.  Has dry cough some days.  PVX and Prevnar 13 are utd.   Followed by Dr. Estil Daft in Rheum. Remains on Imuran. Most pain is in his hands.   Denies  chest pain, orthopnea , edema or fever.    OV 12/09/2015   Chief Complaint  Patient presents with  . Follow-up    Feels about the same since the last time we saw him, cough, with some mucus    Follow-up refractory dyspnea in the setting of mild interstitial lung disease from rheumatoid arthritis and coronary artery disease and multifactorial reasons. Refractory to pulmonary rehabilitation.  He did see my nurse practitioner April 2017 and CT chest did not show any changes to his bilateral lower lobe groundglass opacities and bronchiectasis. He is being maintained on Imuran for his rheumatoid arthritis. He is on oxygen 2 L at bedtime.     Had echocardiogram May 2017 that shows elevated pulmonary artery systolic pressure, mild aortic stenosis and diastolic dysfunction. On 08/13/2015 he underwent left heart catheterization by Dr. Sherren Mocha that shows severe native three-vessel coronary artery disease status post bypass with continued patency of the LIMA to LAD and continued patency of the saphenous vein graft to OM with severe stenosis in the proximal body of the graft for which he had successful PCI of a drug-eluting stent treatment of in-stent restenosis. Recommendation is dual antiplatelet therapy lifelong.   He is with his wife currently. He says dyspnea is only some better. He still has significant dyspnea and faitgues more than his wife.   OV 06/07/2016  Chief Complaint  Patient presents with  . Follow-up    Pt feels like his breathing has not been doing good, pt c/o coughing more with greyish colored mucus, more increase sob with exertion, has some chest tightnes, does has some congestion Denies fever   Follow-up refractory dyspnea in the setting of rheumatoid arthritis andand coronary artery disease. Multifactorial dyspnea. On Imuran for rheumatoid arthritis oxygen 2 L at bedtime. He is refractory to pulmonary rehabilitation. He is believed to have some interstitial lung disease  but April 2017 CT chest suggested only mild bronchiectasis  Last seen in September 2017 and at that time as dyspnea with some better after having a cardiac stent. I discussed with his cardiologist about the possibility of having right heart catheterization but it was felt that the elevation in pulmonary artery pressure was not significant enough on the echocardiogram to warrant this. He tells me that he is deteriorated now and this shortness of breath is even worse. He has tried Symbicort in the past without much help. He is willing to try "anything" to help his dyspnea. Walking desaturation test on 06/07/2016 185 feet x 3 laps on RA:  did NOT desaturate. Rest pulse ox was 97%, final pulse ox was 95%. Did only 2 laps  and stopped in interim x 2. . HR response 64/min at rest to 70/min at peak exertion. He says he stopped due to dyspnea and chest pain - like always       OV 08/03/2016  Chief Complaint  Patient presents with  . Follow-up    Pt here after HRCT. Pt states he tried the trelegy and states it helped his breathing. Pt denies cough, CP/tightness, and f/c/s.     Refractory dyspnea in this gentleman with cardiac disease and also mixed obstructive restrictive spirometry due to air trapping and bronchiectasis and some groundglass.  Last visit I tried him on a sample of triple inhaler therapy. He says that this only worked temporarily and stop working. Nevertheless he wants another inhaler. He is frustrated by his dyspnea. He and his wife are making for relief from dyspnea. In 2016 he visited [place] and they were considering switching his Imuran to CellCept in order to improve his pulmonary parenchymal problems. He is open to that. However he is not sure he needs follow-up with [facility] because Dr Deeann Dowse left the practice there. The suggestion of mild elevation in pulmonary artery systolic pressures and a previous echocardiogram but it was not significant enough to warrant cardiology  to do right heart catheterization. He is willing to have right heart catheterization if recommended. Overall frustrated. Pulm function test does not show any change in 2 years. And CT scan itself in 2018 appears to be stable    IMPRESSION: 1. Stable mild-to-moderate cylindrical and varicoid bronchiectasis in the bilateral dependent lower lobes. 2. Continued stability of parenchymal banding with associated subpleural reticulation and ground-glass attenuation at the areas of bronchiectasis in the dependent and basilar lower lobes, most consistent with postinfectious/postinflammatory scarring. No findings to suggest interstitial lung disease. 3. Stable smooth pleural thickening and calcification in the dependent basilar right pleural space, most compatible with chronic postinflammatory change. No pleural effusions. 4. Stable mild patchy air trapping in both lungs, suggesting small airways disease. 5. Aortic atherosclerosis. Left main and 3 vessel coronary atherosclerosis status post CABG.    OV 01/31/2017  Chief Complaint  Patient presents with  . Follow-up    Pt states that he has been sick x5 months with nausea and vomiting. C/o SOB with exertion, prod. cough with gray mucus, and occ. CP when SOB.    85 year old male with rheumatoid arthritis and interstitial lung disease. He has refractory dyspnea class III on exertion relieved by rest. He also has associated exertional chest pain.at last visit I had his rheumatologist Dr. D evaluate him in switching his Imuran to CellCeptwhich she did in 11/18/2016. He is on low-dose methotrexate was reduced GFR. He is tolerating the medication well. He complains of nausea and vomiting but this preceded the CellCept and Bactrim by 2 months.he is scheduled for an endoscopy in November 2018. He does not change because of the CellCept and Bactrim.Most recent lab 12/26/2016 fairly stable except mild reduction in hemoglobin. Dr. Keturah Barre has referred him back to  primary care physician.he continues to be overwhelmed by his dyspnea and also associated chest pain with exertion. Was recently saw cardiology and had a stable echocardiogram and EKG according to his history. Cardiologist reassured him in his view of the chest pain is not associated with cardiac disease. He therefore is insisting to me that this is because of lung disease. Walking desaturation test 185 feet 3 laps on room air with a forehead probe: Resting pulse ox 99%. Final pulse ox 98%. Resting  heart rate 80/m. Final heart rate 98 a minute. He did get dyspneic with chest pain as always. This no change in his oxygen desaturation test.\  The only changes on the last 3 weeks is complaining of increased cough congestion in his chestand a grayish sputum that is different from from baseline.   OV 04/29/2017  Chief Complaint  Patient presents with  . Follow-up    Pt states his SOB has become worse and coughing with gray mucus. Pt states abx he was placed on last visit did not clear up symptoms. Cellcept and bactrim were both stopped Tuesday, 04/26/17 pt believes.   Follow-up multifactorial refractory dyspnea in the setting of interstitial lung disease related to rheumatoid arthritis  He has been on CellCept for a while associated with Bactrim.  Earlier this week he saw a rheumatologist Dr. Keturah Barre.  He complained of significant nausea.  She called me we spoke.  Because of lack of improvement with dyspnea with the CellCept regimen recommended by [facility].  We decided to discontinue this.  At the same time was having nausea and there was concern this was related to CellCept/Bactrim.  Today he tells me that since stopping CellCept/Bactrim nausea and vomiting have significantly improved.  He still feels fatigued.  His cardiologist now has it on a monitor because of his refractory dyspnea.  He is frustrated by his dyspnea which he says is exertional and associated with central chest pain.  There are no other  new issues.  Recent review of the labs show that he has been anemic early in January 2019 and his chronic kidney disease is getting worse.  The status of these as of today is not known.  Next blood work is only in a month from now/   Walking desaturation test on 04/29/2017 185 feet x 3 laps on ROOM AIR:  did NOT desaturate. Rest pulse ox was 98%, final pulse ox was 95%. HR response 69/min at rest to 94/min at peak exertion. Patient [name]  Did not Desaturate < 88% . [name] yes did  Desaturated </= 3% points. [name] yes did get tachyardic. DID get dyspneic with central chest pain - just like before   OV 08/01/2017    Chief Complaint  Patient presents with  . Follow-up    Pt states he is the same as he was at the last visit, maybe some better. Pt still becomes SOB with exertion, some mild coughing with gray mucus, and occ. chest tightness.   Follow-up multifactorial refractory dyspnea in the setting of interstitial lung disease related to rheumatoid arthritis   85 year old male with rheumatoid arthritis interstitial lung disease and also severe coronary artery disease on medical management.  He is here for follow-up.  There is a 79-monthfollow-up.  In the interim [facility].  His exertional chest pain is somewhat better and his dyspnea somewhat better but he still does have baseline exertional dyspnea and chest pain that is refractory to any treatment.  He is no longer on CellCept or Bactrim for his interstitial lung disease after developing nausea.  He has been off immune modulator treatment for 3 months.  He did see Dr. D rheumatologist end of March 2019 and she was wondering about restarting his old Imuran.  At this point in time he is still going through anemia work-up.  He has a colonoscopy upcoming.  He will  have Plavix on hold prior to the colonoscopy.  At this point in time his walking  desaturation test is stable and documented below.  There are no other new issues.  Walking desaturation test on 08/01/2017 185 feet x 3 laps on ROOM AIR:  did not desaturate. Rest pulse ox was 98%, final pulse ox was 98%. HR response 67/min at rest to 86/min at peak exertion. Patient [name]  Did not Desaturate < 88% . [name] did not  Desaturated </= 3% points. [name] did not get tachyardic.  DID get dyspneic with central chest pain - just like before   Results for [name], [name] (MRN [id number]) as of 08/01/2017 11:25  Ref. Range 06/24/2017 11:36  Creatinine Latest Ref Range: 0.70 - 1.11 mg/dL 2.28 (H)    Results for HAMZEH, TALL (MRN [id number]) as of 08/01/2017 11:25  Ref. Range 06/24/2017 11:36  WBC Latest Ref Range: 3.8 - 10.8 Thousand/uL 7.8   OV 01/17/2019  Subjective:  Patient ID: [name], male , DOB: 1931-04-10 , age 47 y.o. , MRN: [id number] , ADDRESS: [address] [place] [address] Follow-up multifactorial refractory dyspnea in the setting of interstitial lung disease related to rheumatoid arthritis  01/17/2019 -   Chief Complaint  Patient presents with  . ILD (interstitial lung disease)    Feels breathing has not improved. Will take a couple minutes for breathing to get back to normal if he gets short of breath for the last six months.     HPI [name] 85 y.o. -follow-up multifactorial dyspnea setting of interstitial lung disease secondary to rheumatoid arthritis.  Also refractory dyspnea.  Last seen in April 2019.  Since then have not seen him.  At the last visit we recommended he go back on Imuran but review of the rheumatology notes from January 10, 2019 looks like he is not on any immunosuppressive's.  This because he said previous side effects of the GI tract from both Imuran and CellCept.  With the onset of the pandemic he said he is not eating at restaurants anymore and therefore he had and his wife have lost a lot of  weight.  Despite this loss of weight his shortness of breath he says is worse.  Of note every visit he comes he has progressive dyspnea.  The symptom score itself shows the dyspnea is not bad.  He continues to use 2 L of nasal cannula oxygen at night.  Last CT scan of the chest was in 2018 and last PFT was in 2017.        Results for KAICEN, DESENA (MRN [id number]) as of 01/17/2019 12:01  Ref. Range 09/18/2013 11:28 07/29/2015 11:12  DLCO unc Latest Units: ml/min/mmHg 19.91 14.87  DLCO unc % pred Latest Units: % 59 40  IMPRESSION: 1. Stable mild-to-moderate cylindrical and varicoid bronchiectasis in the bilateral dependent lower lobes. 2. Continued stability of parenchymal banding with associated subpleural reticulation and ground-glass attenuation at the areas of bronchiectasis in the dependent and basilar lower lobes, most consistent with postinfectious/postinflammatory scarring. No findings to suggest interstitial lung disease. 3. Stable smooth pleural thickening and calcification in the dependent basilar right pleural space, most compatible with chronic postinflammatory change. No pleural effusions. 4. Stable mild patchy air trapping in both lungs, suggesting small airways disease. 5. Aortic atherosclerosis. Left main and 3 vessel coronary atherosclerosis status post CABG.   Electronically Signed   By: Janina Mayo.D.  On: 07/08/2016 14:11  IMPRESSION: 1. Stable mild-to-moderate cylindrical and varicoid bronchiectasis in the bilateral dependent lower lobes. 2. Continued stability of parenchymal banding with associated subpleural reticulation and ground-glass attenuation at the areas of bronchiectasis in the dependent and basilar lower lobes, most consistent with postinfectious/postinflammatory scarring. No findings to suggest interstitial lung disease. 3. Stable smooth pleural thickening and calcification in the dependent basilar right pleural space, most compatible  with chronic postinflammatory change. No pleural effusions. 4. Stable mild patchy air trapping in both lungs, suggesting small airways disease. 5. Aortic atherosclerosis. Left main and 3 vessel coronary atherosclerosis status post CABG.   Electronically Signed   By: Ilona Sorrel M.D.   On: 07/08/2016 14:11   OV 02/12/2019  Subjective:  Patient ID: SHANDON BURLINGAME, male , DOB: 1932-02-10 , age 55 y.o. , MRN: [id number] , ADDRESS: [address] [place] [address]   02/12/2019 -   Chief Complaint  Patient presents with  . Follow-up    PFT performed 10/26 and CT performed 11/3.  Pt states that his breathing is the same as last visit and is still become SOB with activities.   Follow-up multifactorial dyspnea in the setting of rheumatoid arthritis associated with mild interstitial lung disease and air trapping  HPI [name] 85 y.o. -last visit was approximately 3 weeks ago.  Given his worsening shortness of breath I asked him to do a high-resolution CT chest and also simple walking desaturation test this visit and also pulmonary function test.  He is here to review that.  He is here with his wife.  He now tells me that his shortness of breath is not as bad as he told me it was at the last visit.  Nevertheless it is present on and off with exertion but relieved by rest.  His pulmonary function test shows continued stability over the last 3 years.  His high-resolution CT chest that I personally visualized shows very mild ILD and some air trapping.  He says inhalers have not helped him in the past.  There are no other new issues.  He continues to maintain a lean body weight.  He uses 2 L of oxygen at night.  He is not interested in any more testing at this point.     SYMPTOM SCALE - ILD 01/17/2019  02/12/2019   O2 use  only 2 L at night 2L at ngith  Shortness of Breath 0 -> 5 scale with 5 being worst (score 6 If unable to do)   At rest 0   Simple tasks - showers, clothes  change, eating, shaving 2.5   Household (dishes, doing bed, laundry) 3   Shopping 2   Walking level at own pace 2   Walking keeping up with others of same age 48.5   Walking up Stairs 4   Walking up Hill 4   Total (40 - 48) Dyspnea Score 21   How bad is your cough? 0   How bad is your fatigue 0     Results for [name], [name] (MRN [id number]) as of 02/12/2019 11:44  Ref. Range 09/18/2013 11:28 07/29/2015 11:12 01/29/2019 13:35  FVC-Pre Latest Units: L 2.57 2.52 2.46  FVC-%Pred-Pre Latest Units: % 61 56 59   Results for [name], [name] (MRN [id number]) as of 02/12/2019 11:44  Ref. Range 09/18/2013 11:28 07/29/2015 11:12 01/29/2019 13:35  DLCO unc Latest Units: ml/min/mmHg 19.91 14.87 18.48  DLCO unc % pred Latest Units: % 59 40 73  Simple office walk 185 feet x  3 laps goal with forehead probe 02/12/2019   O2 used RA  Number laps completed 3  Comments about pace nl  Resting Pulse Ox/HR 99% and 60/min  Final Pulse Ox/HR 98% and 80/min  Desaturated </= 88% no  Desaturated <= 3% points no  Got Tachycardic >/= 90/min no  Symptoms at end of test mild  Miscellaneous comments x     xxxxxxxxxxxxxxxxxxxxxxxxxxxxxxxxxxxxxxx CLINICAL DATA:  Chronic shortness of breath. Evaluate for interstitial lung disease.  EXAM: CT CHEST WITHOUT CONTRAST  TECHNIQUE: Multidetector CT imaging of the chest was performed following the standard protocol without intravenous contrast. High resolution imaging of the lungs, as well as inspiratory and expiratory imaging, was performed.  COMPARISON:  07/08/2016 and 07/08/2015.  FINDINGS: Cardiovascular: Atherosclerotic calcification of the aorta and aortic valve. Heart size normal. No pericardial effusion.  Mediastinum/Nodes: No pathologically enlarged mediastinal or axillary lymph nodes. Hilar regions are difficult to definitively evaluate without IV contrast. Esophagus is grossly unremarkable.  Lungs/Pleura: Linear scarring in the  subpleural posterior right upper lobe. Mild cylindrical bronchiectasis. Negative for subpleural reticulation, traction bronchiectasis/bronchiolectasis, ground-glass, architectural distortion or honeycombing. Areas of subpleural scarring in the right lower lobe. Probable tiny calcified granuloma in the left lower lobe. There is air trapping. No pleural fluid. Pleural calcifications in the posterior right hemithorax. Debris is seen dependently in the upper trachea. Airway is otherwise unremarkable.  Upper Abdomen: Visualized portions of the liver, gallbladder and right adrenal gland are unremarkable. Low-attenuation lesion off the upper pole left kidney measures 4.3 cm, incompletely imaged. Visualized portions of the spleen, pancreas, stomach and bowel are grossly unremarkable. Cholecystectomy. No upper abdominal adenopathy.  Musculoskeletal: Degenerative changes in the spine. No worrisome lytic or sclerotic lesions. Flowing anterior osteophytosis in the thoracic spine.  IMPRESSION: 1. Cylindrical bronchiectasis without evidence of fibrotic interstitial lung disease. 2. Air trapping is indicative of small airways disease. 3.  Aortic atherosclerosis (ICD10-170.0).   Electronically Signed   By: Lorin Picket M.D.   On: 02/06/2019 16:00   ROS - per HPI     has a past medical history of Arthritis, CAD (coronary artery disease), Cataract, Chronic diastolic CHF (congestive heart failure) ([place]), Chronic kidney disease, stage III (moderate), Chronic lower back pain, Colon polyps, Diverticulosis, Dyspnea (2009 since July -Sept), Enlarged prostate, Esophageal stricture, GERD (gastroesophageal reflux disease), Heart murmur, Hiatal hernia, History of carpal tunnel syndrome, History of kidney stones, History of PFTs, Hyperlipidemia, Hypertension, Interstitial lung disease ([place]), Iron deficiency anemia, Nausea & vomiting, On home oxygen therapy, Osteoporosis, Overweight (BMI 25.0-29.9),  Peripheral neuropathy, RA (rheumatoid arthritis) ([place]), Seropositive rheumatoid arthritis ([place]), Type II diabetes mellitus ([place]), Wears glasses, and Wears partial dentures.   reports that he quit smoking about 55 years ago. His smoking use included cigarettes. He has a 20.00 pack-year smoking history. He has never used smokeless tobacco.  Past Surgical History:  Procedure Laterality Date  . BALLOON DILATION N/A 09/12/2018   Procedure: BALLOON DILATION;  Surgeon: Irene Shipper, MD;  Location: [facility];  Service: Endoscopy;  Laterality: N/A;  . BOTOX INJECTION N/A 09/12/2018   Procedure: BOTOX INJECTION;  Surgeon: Irene Shipper, MD;  Location: [facility];  Service: Endoscopy;  Laterality: N/A;  . BOTOX INJECTION N/A 11/27/2018   Procedure: BOTOX INJECTION;  Surgeon: Irene Shipper, MD;  [facility]: [facility];  Service: Endoscopy;  Laterality: N/A;  . CARDIAC CATHETERIZATION  08/2004   CP- no MI, Cath- small vessell disease   . CARDIAC  CATHETERIZATION  12/31/2011   80% distal LM, 100% native LAD, LCx and RCA, 30% prox SVG-OM, SVG-D1 normal, 99% distal, 80% ostial SVG-RCA distal to graft, LIMA-LAD normal; LVEF mildly decreased with posterior basal AK   . CARDIAC CATHETERIZATION  2009   with patent grafts/notes 12/31/2011  . CARDIAC CATHETERIZATION N/A 08/13/2015   Procedure: Left Heart Cath and Cors/Grafts Angiography;  Surgeon: Sherren Mocha, MD;  Location: [place] [facility];  Service: Cardiovascular;  Laterality: N/A;  . CATARACT EXTRACTION W/ INTRAOCULAR LENS  IMPLANT, BILATERAL Bilateral   . CHOLECYSTECTOMY OPEN  11/2003   [name]  . CORONARY ANGIOPLASTY WITH STENT PLACEMENT  01/03/2012   Successful DES to SVG-RCA and cutting balloon angioplasty ostial  PDA   . CORONARY ANGIOPLASTY WITH STENT PLACEMENT  01/14/2014   "1"  . CORONARY ARTERY BYPASS GRAFT  11/1999   CABG X5  . CORONARY STENT PLACEMENT  02/2012   1 stent and balloon  . ESOPHAGEAL DILATION  11/27/2018   Procedure: ESOPHAGEAL  DILATION;  Surgeon: Irene Shipper, MD;  [facility];  Service: Endoscopy;;  . ESOPHAGOGASTRODUODENOSCOPY N/A 03/01/2017   Procedure: ESOPHAGOGASTRODUODENOSCOPY (EGD);  Surgeon: Irene Shipper, MD;  Location: [facility];  Service: Endoscopy;  Laterality: N/A;  . ESOPHAGOGASTRODUODENOSCOPY (EGD) WITH ESOPHAGEAL DILATION  2010  . ESOPHAGOGASTRODUODENOSCOPY (EGD) WITH PROPOFOL N/A 09/12/2018   Procedure: ESOPHAGOGASTRODUODENOSCOPY (EGD) WITH PROPOFOL;  Surgeon: Irene Shipper, MD;  [facility];  Service: Endoscopy;  Laterality: N/A;  . ESOPHAGOGASTRODUODENOSCOPY (EGD) WITH PROPOFOL N/A 11/27/2018   Procedure: ESOPHAGOGASTRODUODENOSCOPY (EGD) WITH PROPOFOL, WITH BALLOON DILATION;  Surgeon: Irene Shipper, MD;  [facility];  Service: Endoscopy;  Laterality: N/A;  . HAND SURGERY     bilateral carpal tunnel releases  . JOINT REPLACEMENT    . KNEE ARTHROSCOPY Right 2008  . LEFT AND RIGHT HEART CATHETERIZATION WITH CORONARY ANGIOGRAM  12/31/2011   Procedure: LEFT AND RIGHT HEART CATHETERIZATION WITH CORONARY ANGIOGRAM;  Surgeon: Burnell Blanks, MD;  Location: [facility] CATH LAB;  Service: Cardiovascular;;  . LEFT AND RIGHT HEART CATHETERIZATION WITH CORONARY ANGIOGRAM N/A 01/14/2014   Procedure: LEFT AND RIGHT HEART CATHETERIZATION WITH CORONARY ANGIOGRAM;  Surgeon: Peter M Martinique, MD;  Location: [facility] CATH LAB;  Service: Cardiovascular;  Laterality: N/A;  . MALONEY DILATION  03/01/2017   Procedure: Venia Minks DILATION;  Surgeon: Irene Shipper, MD;  Location: [facility];  Service: Endoscopy;;  . PERCUTANEOUS CORONARY INTERVENTION-BALLOON ONLY  01/03/2012   Procedure: PERCUTANEOUS CORONARY INTERVENTION-BALLOON ONLY;  Surgeon: Peter M Martinique, MD;  Location: [facility] CATH LAB;  Service: Cardiovascular;;  . PERCUTANEOUS CORONARY STENT INTERVENTION (PCI-S)  12/31/2011   Procedure: PERCUTANEOUS CORONARY STENT INTERVENTION (PCI-S);  Surgeon: Burnell Blanks, MD;  Location: [facility] CATH LAB;   Service: Cardiovascular;;  . PERCUTANEOUS CORONARY STENT INTERVENTION (PCI-S) N/A 01/03/2012   Procedure: PERCUTANEOUS CORONARY STENT INTERVENTION (PCI-S);  Surgeon: Peter M Martinique, MD;  Location: [facility] CATH LAB;  Service: Cardiovascular;  Laterality: N/A;  . SHOULDER ARTHROSCOPY WITH OPEN ROTATOR CUFF REPAIR AND DISTAL CLAVICLE ACROMINECTOMY Left 02/27/2013   Procedure: LEFT SHOULDER ARTHROSCOPY WITH MINI OPEN ROTATOR CUFF REPAIR AND SUBACROMIAL DECOMPRESSION AND DISTAL CLAVICLE RESECTION;  Surgeon: [name], MD;  Location: [facility];  Service: Orthopedics;  Laterality: Left;  . TOTAL KNEE ARTHROPLASTY Right 03/2010   [name]  . TRIGGER FINGER RELEASE Left 02/27/2013   Procedure: RELEASE TRIGGER FINGER/A-1 PULLEY;  Surgeon: [name], MD;  Location: [place];  Service: Orthopedics;  Laterality: Left;  Allergies  Allergen Reactions  . Doxazosin Mesylate Other (See Comments)    dizziness  . Methocarbamol Rash    Immunization History  Administered Date(s) Administered  . H1N1 03/27/2008  . Influenza Split 12/29/2010, 01/18/2012  . Influenza Whole 02/03/2007, 01/09/2008, 01/01/2009, 12/31/2009  . Influenza,inj,Quad PF,6+ Mos 12/13/2012, 01/15/2014, 01/15/2015, 01/06/2016, 01/03/2017, 02/09/2018, 01/30/2019  . Pneumococcal Conjugate-13 09/10/2013  . Pneumococcal Polysaccharide-23 03/05/1997, 08/17/2011  . Td 03/05/1997, 08/17/2011    Family History  Problem Relation Age of Onset  . COPD Mother   . Heart disease Father   . Heart attack Father   . Stomach cancer Brother   . Diabetes Brother   . Colon cancer Brother 25  . Alcohol abuse Sister   . Stroke Sister   . Rectal cancer Neg Hx      Current Outpatient Medications:  .  aspirin EC 81 MG tablet, Take 81 mg by mouth at bedtime., Disp: , Rfl:  .  Blood Glucose Monitoring Suppl (ONE TOUCH ULTRA 2) w/Device KIT, Use to obtain blood sugar daily. Dx Code E11.40, Disp: 1 each, Rfl: 0 .  carvedilol (COREG) 3.125 MG  tablet, TAKE 1 TABLET BY MOUTH TWICE DAILY WITH MEALS (Patient taking differently: Take 3.125 mg by mouth 2 (two) times daily with a meal. ), Disp: 180 tablet, Rfl: 3 .  clopidogrel (PLAVIX) 75 MG tablet, TAKE 1 TABLET BY MOUTH ONCE A DAY WITH BREAKFAST., Disp: 90 tablet, Rfl: 2 .  DHA-Vitamin C-Lutein (EYE HEALTH FORMULA PO), Take 1 tablet by mouth daily. , Disp: , Rfl:  .  furosemide (LASIX) 40 MG tablet, Take 40 mg by mouth at bedtime. Monday, Wednesday, Friday., Disp: , Rfl:  .  glucose blood (ONE TOUCH ULTRA TEST) test strip, USE TO CHECK BLOOD SUGAR ONCE A DAY Dx Code E11.40, Disp: 100 each, Rfl: 3 .  HYDROcodone-acetaminophen (NORCO) 10-325 MG tablet, Take 1 tablet by mouth 4 (four) times daily as needed., Disp: , Rfl:  .  ONETOUCH DELICA LANCETS 35K MISC, 1 each by In Vitro route daily. Dx Code E11.49, Disp: 100 each, Rfl: 3 .  OXYGEN, Inhale into the lungs. Per pt- Uses Oxygen 2 liters at night., Disp: , Rfl:  .  pantoprazole (PROTONIX) 40 MG tablet, TAKE 1 TABLET BY MOUTH TWICE (2) DAILY (Patient taking differently: Take 40 mg by mouth. ), Disp: 180 tablet, Rfl: 3 .  polyethylene glycol (MIRALAX / GLYCOLAX) 17 g packet, Take 51 g by mouth daily. , Disp: , Rfl:  .  tamsulosin (FLOMAX) 0.4 MG CAPS capsule, Take 0.4 mg by mouth daily., Disp: , Rfl:  .  nitroGLYCERIN (NITROSTAT) 0.4 MG SL tablet, DISSOLVE 1 TABLET UNDER TONGUE AS NEEDEDFOR CHEST PAIN. MAY REPEAT 5 MINUTES APART 3 TIMES IF NEEDED. IF NO RELIEF CALL 911 (Patient not taking: Reported on 02/12/2019), Disp: 25 tablet, Rfl: 1      Objective:   Vitals:   02/12/19 1129  BP: 128/74  Pulse: 60  SpO2: 99%  Weight: 194 lb 12.8 oz (88.4 kg)  Height: '5\' 11"'$  (1.803 m)    Estimated body mass index is 27.17 kg/m as calculated from the following:   Height as of this encounter: '5\' 11"'$  (1.803 m).   Weight as of this encounter: 194 lb 12.8 oz (88.4 kg).  '@WEIGHTCHANGE'$ @  [employer]   02/12/19 1129  Weight: 194 lb 12.8 oz (88.4  kg)     Physical Exam Very focused exam shows flat affect lean body weight and bilateral lower lobe  crackles that are faint.       Assessment:     No diagnosis found.     Plan:     Patient Instructions     ICD-10-CM   1. ILD (interstitial lung disease) ([place]) J84.9   2. Dyspnea, unspecified type R06.00    Your pulmonary fibrosis or interstitial lung disease is extremely mild and it is stable in the last few years. Stability determination is based on pulmonary function test and on high-resolution CT chest October/November 2020  Plan [place] o2 2L [place] at night No role for antifibrotic Continue to stay physically fit and maintain all your health systems as optimally as you can  Followup -9 months or sooner if needed -at follow-up due interstitial lung disease symptom score and simple walking desaturation test  (> 50% of this 15 min visit spent in face to face counseling or/and coordination of care by this undersigned MD - Dr Brand Males. This includes one or more of the following documented above: discussion of test results, diagnostic or treatment recommendations, prognosis, risks and benefits of management options, instructions, education, compliance or risk-factor reduction)   SIGNATURE    Dr. Brand Males, M.D., F.C.C.P,  Pulmonary and Critical Care Medicine Staff Physician, [place] Director - Interstitial Lung Disease  Program  Pulmonary [place] [facility] [place], [place], [address]  Pager: [phone], If no answer or between  15:00h - 7:00h: call [phone] Telephone: [phone]  11:59 AM 02/12/2019

## 2019-02-12 NOTE — Patient Instructions (Addendum)
ICD-10-CM   1. ILD (interstitial lung disease) ([place]) J84.9   2. Dyspnea, unspecified type R06.00    Your pulmonary fibrosis or interstitial lung disease is extremely mild and it is stable in the last few years. Stability determination is based on pulmonary function test and on high-resolution CT chest October/November 2020  Plan Cotninue o2 2L [place] at night No role for antifibrotic Continue to stay physically fit and maintain all your health systems as optimally as you can  Followup -9 months or sooner if needed -at follow-up due interstitial lung disease symptom score and simple walking desaturation test

## 2019-02-13 DIAGNOSIS — R809 Proteinuria, unspecified: Secondary | ICD-10-CM | POA: Diagnosis not present

## 2019-02-13 DIAGNOSIS — N1832 Chronic kidney disease, stage 3b: Secondary | ICD-10-CM | POA: Diagnosis not present

## 2019-02-13 DIAGNOSIS — E1122 Type 2 diabetes mellitus with diabetic chronic kidney disease: Secondary | ICD-10-CM | POA: Diagnosis not present

## 2019-02-13 DIAGNOSIS — I129 Hypertensive chronic kidney disease with stage 1 through stage 4 chronic kidney disease, or unspecified chronic kidney disease: Secondary | ICD-10-CM | POA: Diagnosis not present

## 2019-02-16 DIAGNOSIS — R0689 Other abnormalities of breathing: Secondary | ICD-10-CM | POA: Diagnosis not present

## 2019-02-16 DIAGNOSIS — J8410 Pulmonary fibrosis, unspecified: Secondary | ICD-10-CM | POA: Diagnosis not present

## 2019-03-18 DIAGNOSIS — J8410 Pulmonary fibrosis, unspecified: Secondary | ICD-10-CM | POA: Diagnosis not present

## 2019-03-18 DIAGNOSIS — R0689 Other abnormalities of breathing: Secondary | ICD-10-CM | POA: Diagnosis not present

## 2019-03-19 ENCOUNTER — Encounter: Payer: Self-pay | Admitting: Emergency Medicine

## 2019-03-19 ENCOUNTER — Encounter: Payer: Self-pay | Admitting: Student in an Organized Health Care Education/Training Program

## 2019-03-19 ENCOUNTER — Ambulatory Visit
Admission: EM | Admit: 2019-03-19 | Discharge: 2019-03-19 | Disposition: A | Discharge status: Home / Self Care | Payer: Medicare Other | Attending: Emergency Medicine | Admitting: Emergency Medicine

## 2019-03-19 ENCOUNTER — Other Ambulatory Visit: Payer: Self-pay

## 2019-03-19 ENCOUNTER — Ambulatory Visit
Payer: Medicare Other | Attending: Student in an Organized Health Care Education/Training Program | Admitting: Student in an Organized Health Care Education/Training Program

## 2019-03-19 DIAGNOSIS — Z20828 Contact with and (suspected) exposure to other viral communicable diseases: Secondary | ICD-10-CM

## 2019-03-19 DIAGNOSIS — M792 Neuralgia and neuritis, unspecified: Secondary | ICD-10-CM

## 2019-03-19 DIAGNOSIS — M059 Rheumatoid arthritis with rheumatoid factor, unspecified: Secondary | ICD-10-CM

## 2019-03-19 DIAGNOSIS — U071 COVID-19: Secondary | ICD-10-CM

## 2019-03-19 DIAGNOSIS — N183 Chronic kidney disease, stage 3 unspecified: Secondary | ICD-10-CM

## 2019-03-19 DIAGNOSIS — M79604 Pain in right leg: Secondary | ICD-10-CM | POA: Diagnosis not present

## 2019-03-19 DIAGNOSIS — E1142 Type 2 diabetes mellitus with diabetic polyneuropathy: Secondary | ICD-10-CM

## 2019-03-19 DIAGNOSIS — G629 Polyneuropathy, unspecified: Secondary | ICD-10-CM

## 2019-03-19 DIAGNOSIS — G8929 Other chronic pain: Secondary | ICD-10-CM

## 2019-03-19 DIAGNOSIS — E1122 Type 2 diabetes mellitus with diabetic chronic kidney disease: Secondary | ICD-10-CM

## 2019-03-19 DIAGNOSIS — M79605 Pain in left leg: Secondary | ICD-10-CM

## 2019-03-19 DIAGNOSIS — G894 Chronic pain syndrome: Secondary | ICD-10-CM

## 2019-03-19 DIAGNOSIS — Z20822 Contact with and (suspected) exposure to covid-19: Secondary | ICD-10-CM

## 2019-03-19 DIAGNOSIS — Z79891 Long term (current) use of opiate analgesic: Secondary | ICD-10-CM

## 2019-03-19 MED ORDER — HYDROCODONE-ACETAMINOPHEN 10-325 MG PO TABS: 1.0000 | ORAL_TABLET | ORAL | 0 refills | Status: AC | PRN

## 2019-03-19 MED ORDER — HYDROCODONE-ACETAMINOPHEN 10-325 MG PO TABS: 1.0000 | ORAL_TABLET | ORAL | 0 refills | Status: DC | PRN

## 2019-03-19 NOTE — Discharge Instructions (Addendum)
Your COVID test is positive.    You should self-quarantine for:  *10 days since your symptoms first appeared and  *24 hours with no fever, without the use of fever-reducing medications and  *your other symptoms of COVID are improving.  Most people do not need to be re-tested at the end of the quarantine period.    Go to the emergency department if you have high fever not relieved by Tylenol, shortness of breath, severe diarrhea, or other concerning symptoms.

## 2019-03-19 NOTE — Progress Notes (Signed)
Pain Management Virtual Encounter Note - Virtual Visit via Telephone Telehealth (real-time audio visits between healthcare provider and patient).   Patient's Phone No. & Preferred Pharmacy:  [phone] (home); [phone] (mobile); (Preferred) [phone] No e-mail address on record  [place], [place], [address], [place] [address] Phone: [phone] Fax: [phone]  [place], [place] [address] [place] [address] Phone: [phone] Fax: [phone]    Pre-screening note:  Our staff contacted [name] and offered him an "in person", "face-to-face" appointment versus a telephone encounter. He indicated preferring the telephone encounter, at this time.   Reason for Virtual Visit: COVID-19*  Social distancing based on CDC and AMA recommendations.   I contacted [name] on 03/19/2019 via telephone.      I clearly identified myself as Gillis Santa, MD. I verified that I was speaking with the correct person using two identifiers (Name: [name], and date of birth: 23-Apr-1931).  Advanced Informed Consent I sought verbal advanced consent from [name] for virtual visit interactions. I informed [name] of possible security and privacy concerns, risks, and limitations associated with providing "not-in-person" medical evaluation and management services. I also informed [name] of the availability of "in-person" appointments. Finally, I informed him that there would be a charge for the virtual visit and that he could be  personally, fully or partially, financially responsible for it. [name] expressed understanding and agreed to proceed.   Historic Elements   [name] is a 85 y.o. year old, male patient evaluated today after his last encounter by our practice on 12/14/2018. [name]  has a past medical history of  Arthritis, CAD (coronary artery disease), Cataract, Chronic diastolic CHF (congestive heart failure) ([place]), Chronic kidney disease, stage III (moderate), Chronic lower back pain, Colon polyps, Diverticulosis, Dyspnea (2009 since July -Sept), Enlarged prostate, Esophageal stricture, GERD (gastroesophageal reflux disease), Heart murmur, Hiatal hernia, History of carpal tunnel syndrome, History of kidney stones, History of PFTs, Hyperlipidemia, Hypertension, Interstitial lung disease ([place]), Iron deficiency anemia, Nausea & vomiting, On home oxygen therapy, Osteoporosis, Overweight (BMI 25.0-29.9), Peripheral neuropathy, RA (rheumatoid arthritis) ([place]), Seropositive rheumatoid arthritis ([place]), Type II diabetes mellitus ([place]), Wears glasses, and Wears partial dentures. He also  has a past surgical history that includes Total knee arthroplasty (Right, 03/2010); Knee arthroscopy (Right, 2008); Cataract extraction w/ intraocular lens  implant, bilateral (Bilateral); Coronary artery bypass graft (11/1999); Coronary stent placement (02/2012); Shoulder arthroscopy with open rotator cuff repair and distal clavicle acrominectomy (Left, 02/27/2013); Trigger finger release (Left, 02/27/2013); Cholecystectomy open (11/2003); Joint replacement; Esophagogastroduodenoscopy (egd) with esophageal dilation (2010); Cardiac catheterization (08/2004); Cardiac catheterization (12/31/2011); Cardiac catheterization (2009); left and right heart catheterization with coronary angiogram (12/31/2011); percutaneous coronary stent intervention (pci-s) (12/31/2011); percutaneous coronary stent intervention (pci-s) (N/A, 01/03/2012); Percutaneous coronary intervention-balloon only (01/03/2012); left and right heart catheterization with coronary angiogram (N/A, 01/14/2014); Hand surgery; Cardiac catheterization (N/A, 08/13/2015); Esophagogastroduodenoscopy (N/A, 03/01/2017); maloney dilation (03/01/2017); Esophagogastroduodenoscopy (egd) with propofol (N/A,  09/12/2018); Botox injection (N/A, 09/12/2018); Balloon dilation (N/A, 09/12/2018); Coronary angioplasty with stent (01/03/2012); Coronary angioplasty with stent (01/14/2014); Esophagogastroduodenoscopy (egd) with propofol (N/A, 11/27/2018); Botox injection (N/A, 11/27/2018); and Esophageal dilation (11/27/2018). [name] has a current medication list which includes the following prescription(s): aspirin ec, one touch ultra 2, carvedilol, clopidogrel, dha-vitamin c-lutein, furosemide, glucose blood, hydrocodone-acetaminophen, [START ON 04/18/2019] hydrocodone-acetaminophen, [START ON 05/18/2019] hydrocodone-acetaminophen, nitroglycerin, onetouch delica  lancets 33g, oxygen-helium, pantoprazole, polyethylene glycol, and tamsulosin. He  reports that he quit smoking about 55 years ago. His smoking use included cigarettes. He has a 20.00 pack-year smoking history. He has never used smokeless tobacco. He reports that he does not drink alcohol or use drugs. [name] is allergic to doxazosin mesylate and methocarbamol.   HPI  Today, he is being contacted for medication management.   Having increased low back pain during winter months which is common. Utilizing one extra hydrocodone tablet on most days. Requesting to increase prescription quantity.  Patient continues multimodal pain regimen as prescribed.  States that it provides pain relief and improvement in functional status but could be optimized.   Pharmacotherapy Assessment  Analgesic: 02/17/2019  2   12/14/2018  Hydrocodone-Acetamin 10-325 MG  120.00  30 Bi Lat   [id number]   Gib (4800)   0  40.00 MME  Medicare   [place]   Monitoring: Pharmacotherapy: No side-effects or adverse reactions reported. [place] PMP: PDMP reviewed during this encounter.       Compliance: No problems identified. Effectiveness: Clinically acceptable. Plan: Refer to "POC".  UDS:  Summary  Date Value Ref Range Status  12/19/2018 Note  Final    Comment:     ==================================================================== [employer] Select 13 (MW) ==================================================================== Test                             Result       Flag       Units Drug Present and Declared for Prescription Verification   Hydrocodone                    3037         EXPECTED   ng/mg creat   Hydromorphone                  2885         EXPECTED   ng/mg creat   Dihydrocodeine                 545          EXPECTED   ng/mg creat   Norhydrocodone                 2240         EXPECTED   ng/mg creat    Sources of hydrocodone include scheduled prescription medications.    Hydromorphone, dihydrocodeine and norhydrocodone are expected    metabolites of hydrocodone. Hydromorphone and dihydrocodeine are    also available as scheduled prescription medications. ==================================================================== Test                      Result    Flag   Units      Ref Range   Creatinine              73               mg/dL      >=20 ==================================================================== Declared Medications:  The flagging and interpretation on this report are based on the  following declared medications.  Unexpected results may arise from  inaccuracies in the declared medications.  **Note: The testing scope of this panel includes these medications:  Hydrocodone  **Note: The testing scope of this panel does not include the  following reported medications:  Acetaminophen  Aspirin  Carvedilol  Cholecalciferol  Clopidogrel (Plavix)  Furosemide  Lutein  Nitroglycerin (Nitrostat)  Omega-3 Fatty Acids  Oxygen  Pantoprazole (Protonix)  Polyethylene Glycol (MiraLAX)  Vitamin C ==================================================================== For clinical consultation, please call [phone]. ====================================================================    Laboratory Chemistry Profile (12 mo)   Renal: 04/28/2018: BUN 32; Creatinine, Ser 2.24  Lab Results  Component Value Date   GFR 28.02 (L) 04/28/2018   GFRAA 30 (L) 06/24/2017   GFRNONAA 26 (L) 06/24/2017   Hepatic: 04/28/2018: Albumin 4.1; Albumin 4.1 Lab Results  Component Value Date   AST 17 04/28/2018   ALT 14 04/28/2018   Other: No results found for requested labs within last 8760 hours. Note: Above Lab results reviewed.  Imaging  CT Chest High Resolution CLINICAL DATA:  Chronic shortness of breath. Evaluate for interstitial lung disease.  EXAM: CT CHEST WITHOUT CONTRAST  TECHNIQUE: Multidetector CT imaging of the chest was performed following the standard protocol without intravenous contrast. High resolution imaging of the lungs, as well as inspiratory and expiratory imaging, was performed.  COMPARISON:  07/08/2016 and 07/08/2015.  FINDINGS: Cardiovascular: Atherosclerotic calcification of the aorta and aortic valve. Heart size normal. No pericardial effusion.  Mediastinum/Nodes: No pathologically enlarged mediastinal or axillary lymph nodes. Hilar regions are difficult to definitively evaluate without IV contrast. Esophagus is grossly unremarkable.  Lungs/Pleura: Linear scarring in the subpleural posterior right upper lobe. Mild cylindrical bronchiectasis. Negative for subpleural reticulation, traction bronchiectasis/bronchiolectasis, ground-glass, architectural distortion or honeycombing. Areas of subpleural scarring in the right lower lobe. Probable tiny calcified granuloma in the left lower lobe. There is air trapping. No pleural fluid. Pleural calcifications in the posterior right hemithorax. Debris is seen dependently in the upper trachea. Airway is otherwise unremarkable.  Upper Abdomen: Visualized portions of the liver, gallbladder and right adrenal gland are unremarkable. Low-attenuation lesion off the upper pole left kidney measures 4.3 cm, incompletely imaged. Visualized portions of  the spleen, pancreas, stomach and bowel are grossly unremarkable. Cholecystectomy. No upper abdominal adenopathy.  Musculoskeletal: Degenerative changes in the spine. No worrisome lytic or sclerotic lesions. Flowing anterior osteophytosis in the thoracic spine.  IMPRESSION: 1. Cylindrical bronchiectasis without evidence of fibrotic interstitial lung disease. 2. Air trapping is indicative of small airways disease. 3.  Aortic atherosclerosis (ICD10-170.0).  Electronically Signed   By: Lorin Picket M.D.   On: 02/06/2019 16:00   Assessment  The primary encounter diagnosis was Neuropathic pain. Diagnoses of Chronic pain of both lower extremities, Long term prescription opiate use, Diabetic polyneuropathy associated with type 2 diabetes mellitus ([place]), Seropositive rheumatoid arthritis ([place]), CKD stage 3 due to type 2 diabetes mellitus ([place]), Neuropathy, and Chronic pain syndrome were also pertinent to this visit.  Plan of Care   I have changed [name]'s HYDROcodone-acetaminophen. I am also having him start on HYDROcodone-acetaminophen and HYDROcodone-acetaminophen. Additionally, I am having him maintain his DHA-Vitamin C-Lutein (EYE HEALTH FORMULA PO), aspirin EC, OXYGEN, ONE TOUCH ULTRA 2, glucose blood, OneTouch Delica Lancets 48A, nitroGLYCERIN, polyethylene glycol, pantoprazole, carvedilol, furosemide, clopidogrel, and tamsulosin.  Pharmacotherapy (Medications Ordered): Meds ordered this encounter  Medications  . HYDROcodone-acetaminophen (NORCO) 10-325 MG tablet    Sig: Take 1 tablet by mouth every 4 (four) hours as needed. For  Chronic pain. Max 150 tablets/month    Dispense:  150 tablet    Refill:  0  . HYDROcodone-acetaminophen (NORCO) 10-325 MG tablet    Sig: Take 1 tablet by mouth every 4 (four) hours as needed. For  Chronic pain. Max 150 tablets/month    Dispense:  150 tablet    Refill:  0  . HYDROcodone-acetaminophen (NORCO) 10-325 MG tablet    Sig: Take 1  tablet by mouth every 4 (four) hours as needed. For  Chronic pain. Max 150 tablets/month    Dispense:  150 tablet    Refill:  0   Follow-up plan:   Return in about 3 months (around 06/17/2019) for Medication Management, virtual.     Recent Visits No visits were found meeting these conditions.  Showing recent visits within past 90 days and meeting all other requirements   Today's Visits Date Type Provider Dept  03/19/19 Office Visit Gillis Santa, MD [facility]  Showing today's visits and meeting all other requirements   Future Appointments No visits were found meeting these conditions.  Showing future appointments within next 90 days and meeting all other requirements   I discussed the assessment and treatment plan with the patient. The patient was provided an opportunity to ask questions and all were answered. The patient agreed with the plan and demonstrated an understanding of the instructions.  Patient advised to call back or seek an in-person evaluation if the symptoms or condition worsens.  Total duration of non-face-to-face encounter: 25 minutes.  Note by: Gillis Santa, MD Date: 03/19/2019; Time: 1:42 PM  Note: This dictation was prepared with Dragon dictation. Any transcriptional errors that may result from this process are unintentional.

## 2019-03-19 NOTE — ED Triage Notes (Signed)
Pt states he was exposed to covid on 12/07 from his daughter, started having a cough and a fever with congestion on 12/10.

## 2019-03-19 NOTE — ED Provider Notes (Signed)
[name]    CSN: [id number] Arrival date & time: 03/19/19  1502      History   Chief Complaint Chief Complaint  Patient presents with  . Cough    HPI  [name] is a 85 y.o. male.   Patient presents with nonproductive cough and fever x4 days.  He also reports mild nasal congestion.  He did not take his temperature but states he felt warm.  He states he was exposed to [place] by family member and then again at church.  He denies sore throat, shortness of breath, vomiting, diarrhea, or other symptoms.  No treatments attempted at home.  The history is provided by the patient and the spouse.    Past Medical History:  Diagnosis Date  . Arthritis    osteoarthritis, s/p R TKR, and digits  . CAD (coronary artery disease)    a. s/p CABG (2001)  b. s/p DES to RCA and cutting POBA to ostial PDA (2013)   c. s/p DES to SVG to OM2 (01/14/14) d. cath: 08/2015 NSTEMI w/ patent LIMA-LAD and 99% stenosis of SVG-OM w/ DES placed. CTO of SVG-RCA and SVG-D1.   . Cataract   . Chronic diastolic CHF (congestive heart failure) ([place])    a) 09/13 ECHO- LVEF 87-68%, grade 1 diastolic dysfunction, mild LA dilatation, atrial septal aneurysm, AV mobility restricted, but no sig AS by doppler; b) 09/04/08 ECHO- LVH, ef 60%, mild AS, c. echo 08/2015: EF perserved of 55-60% with inferolateral HK. Mild AS noted.  . Chronic kidney disease, stage III (moderate)   . Chronic lower back pain   . Colon polyps   . Diverticulosis   . Dyspnea 2009 since July -Sept   05/06/08-CPST-  normal effort, reduced VO2 max 20.5 /65%, reduced at 8.2/ 40%, normal breathing resetvca of 55%, submaximal heart rate response 112/77%, flattened o2 pluse response at peak exercise-12 ml/beat @ 85%, No VQ mismatch abnormalities, All c/w CIRC Limitation  . Enlarged prostate   . Esophageal stricture    a. s/p dilation spring 2010  . GERD (gastroesophageal reflux disease)   . Heart murmur   . Hiatal hernia   . History of carpal  tunnel syndrome    Bilateral  . History of kidney stones   . History of PFTs    mixed pattern on spiro. mild restn on lung volumes with near normal DLCO. Pattern can be explained by CABG scar. Fev1 2.2L/73%, ratio 68 (67), TLC 4.7/68%,RV 1.5L/55%,DLCO 79%  . Hyperlipidemia   . Hypertension   . Interstitial lung disease ([name])    NOS  . Iron deficiency anemia   . Nausea & vomiting    2018/2019  . On home oxygen therapy    2 L [place] at bedtime  . Osteoporosis   . Overweight (BMI 25.0-29.9)    BMI 29  . Peripheral neuropathy   . RA (rheumatoid arthritis) ([name])    [name]  . Seropositive rheumatoid arthritis ([place])   . Type II diabetes mellitus ([name])    diet controlled  . Wears glasses   . Wears partial dentures    upper    Patient Active Problem List   Diagnosis Date Noted  . Long term prescription opiate use 12/14/2018  . Neuropathy 12/14/2018  . CKD stage 3 due to type 2 diabetes mellitus ([place]) 12/14/2018  . Problems with swallowing and mastication   . Achalasia   . Right inguinal hernia 07/04/2018  . Advance directive discussed with patient 04/28/2018  .  Chronic pain of both lower extremities 10/26/2017  . Neuropathic pain 10/26/2017  . Chronic pain syndrome 10/26/2017  . Iron deficiency anemia 07/05/2017  . Constipation 07/05/2017  . CKD (chronic kidney disease) stage 4, GFR 15-29 ml/min ([name]) 05/17/2017  . Seropositive rheumatoid arthritis ([place]) 05/17/2017  . DM (diabetes mellitus) type II controlled, neurological manifestation ([place]) 05/17/2017  . Atherosclerotic heart disease of native coronary artery with angina pectoris ([place]) 05/17/2017  . Dysphagia 05/17/2017  . High risk medication use 10/21/2016  . Primary osteoarthritis of both knees 10/21/2016  . History of right knee joint replacement 10/21/2016  . Orthostatic hypotension 10/01/2016  . Diarrhea 08/18/2016  . Tegretol-induced dizziness 05/14/2016  . Spinal stenosis of lumbar region without neurogenic  claudication 03/23/2016  . Spondylosis without myelopathy or radiculopathy, lumbar region 03/23/2016  . Respiratory failure, chronic ([place]) 11/21/2014  . Rheumatoid arthritis ([place]) 11/05/2014  . Rheumatic fever without heart involvement 03/04/2014  . Ventral hernia 12/17/2013  . Routine general medical examination at a health care facility 08/29/2012  . Routine history and physical examination of adult 08/29/2012  . ILD (interstitial lung disease) ([place]) 11/28/2011  . Chronic diastolic heart failure ([place]) 09/14/2011  . Diabetic polyneuropathy associated with type 2 diabetes mellitus ([place]) 08/10/2010  . Obstructive sleep apnea 12/17/2008  . ESOPHAGEAL STRICTURE 10/09/2008  . Esophageal stricture 10/09/2008  . Reflux esophagitis 09/10/2008  . Diverticulosis of colon 09/10/2008  . Gastro-esophageal reflux disease with esophagitis 09/10/2008  . Coronary atherosclerosis 03/19/2008  . Aortic valve disorder 03/19/2008  . Thoracic aorta atherosclerosis ([place]) 03/19/2008  . Actinic keratosis 10/23/2007  . SLEEP DISORDER, CHRONIC 10/17/2006  . Disturbance in sleep behavior 10/17/2006  . 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 Essential hypertension 09/23/2006  . GERD 09/23/2006  . BENIGN PROSTATIC HYPERTROPHY 09/23/2006  . Enlarged prostate without lower urinary tract symptoms (luts) 09/23/2006  . HLD (hyperlipidemia) 09/23/2006    Past Surgical History:  Procedure Laterality Date  . BALLOON DILATION N/A 09/12/2018   Procedure: BALLOON DILATION;  Surgeon: Irene Shipper, MD;  Location: [facility];  Service: Endoscopy;  Laterality: N/A;  . BOTOX INJECTION N/A 09/12/2018   Procedure: BOTOX INJECTION;  Surgeon: Irene Shipper, MD;  Location: [facility];  Service: Endoscopy;  Laterality: N/A;  . BOTOX INJECTION N/A 11/27/2018   Procedure: BOTOX INJECTION;  Surgeon: Irene Shipper, MD;  Location: [facility];  Service: Endoscopy;  Laterality: N/A;  . CARDIAC CATHETERIZATION   08/2004   CP- no MI, Cath- small vessell disease   . CARDIAC CATHETERIZATION  12/31/2011   80% distal LM, 100% native LAD, LCx and RCA, 30% prox SVG-OM, SVG-D1 normal, 99% distal, 80% ostial SVG-RCA distal to graft, LIMA-LAD normal; LVEF mildly decreased with posterior basal AK   . CARDIAC CATHETERIZATION  2009   with patent grafts/notes 12/31/2011  . CARDIAC CATHETERIZATION N/A 08/13/2015   Procedure: Left Heart Cath and Cors/Grafts Angiography;  Surgeon: Sherren Mocha, MD;  [facility];  Service: Cardiovascular;  Laterality: N/A;  . CATARACT EXTRACTION W/ INTRAOCULAR LENS  IMPLANT, BILATERAL Bilateral   . CHOLECYSTECTOMY OPEN  11/2003   [name]  . CORONARY ANGIOPLASTY WITH STENT PLACEMENT  01/03/2012   Successful DES to SVG-RCA and cutting balloon angioplasty ostial  PDA   . CORONARY ANGIOPLASTY WITH STENT PLACEMENT  01/14/2014   "1"  . CORONARY ARTERY BYPASS GRAFT  11/1999   CABG X5  . CORONARY STENT PLACEMENT  02/2012   1 stent and balloon  . ESOPHAGEAL DILATION  11/27/2018   Procedure: ESOPHAGEAL DILATION;  Surgeon: Irene Shipper, MD;  Location: [facility];  Service: Endoscopy;;  . ESOPHAGOGASTRODUODENOSCOPY N/A 03/01/2017   Procedure: ESOPHAGOGASTRODUODENOSCOPY (EGD);  Surgeon: Irene Shipper, MD;  Location: [facility];  Service: Endoscopy;  Laterality: N/A;  . ESOPHAGOGASTRODUODENOSCOPY (EGD) WITH ESOPHAGEAL DILATION  2010  . ESOPHAGOGASTRODUODENOSCOPY (EGD) WITH PROPOFOL N/A 09/12/2018   Procedure: ESOPHAGOGASTRODUODENOSCOPY (EGD) WITH PROPOFOL;  Surgeon: Irene Shipper, MD;  [facility];  Service: Endoscopy;  Laterality: N/A;  . ESOPHAGOGASTRODUODENOSCOPY (EGD) WITH PROPOFOL N/A 11/27/2018   Procedure: ESOPHAGOGASTRODUODENOSCOPY (EGD) WITH PROPOFOL, WITH BALLOON DILATION;  Surgeon: Irene Shipper, MD;  Location: [facility];  Service: Endoscopy;  Laterality: N/A;  . HAND SURGERY     bilateral carpal tunnel releases  . JOINT REPLACEMENT    . KNEE ARTHROSCOPY  Right 2008  . LEFT AND RIGHT HEART CATHETERIZATION WITH CORONARY ANGIOGRAM  12/31/2011   Procedure: LEFT AND RIGHT HEART CATHETERIZATION WITH CORONARY ANGIOGRAM;  Surgeon: Burnell Blanks, MD;  Location: [facility] C[facility] LAB;  Service: Cardiovascular;;  . LEFT AND RIGHT HEART CATHETERIZATION WITH CORONARY ANGIOGRAM N/A 01/14/2014   Procedure: LEFT AND RIGHT HEART CATHETERIZATION WITH CORONARY ANGIOGRAM;  Surgeon: Peter M Martinique, MD;  Location: [facility] CATH LAB;  Service: Cardiovascular;  Laterality: N/A;  . MALONEY DILATION  03/01/2017   Procedure: Venia Minks DILATION;  Surgeon: Irene Shipper, MD;  Location: [facility];  Service: Endoscopy;;  . PERCUTANEOUS CORONARY INTERVENTION-BALLOON ONLY  01/03/2012   Procedure: PERCUTANEOUS CORONARY INTERVENTION-BALLOON ONLY;  Surgeon: Peter M Martinique, MD;  Location: [facility] CATH LAB;  Service: Cardiovascular;;  . PERCUTANEOUS CORONARY STENT INTERVENTION (PCI-S)  12/31/2011   Procedure: PERCUTANEOUS CORONARY STENT INTERVENTION (PCI-S);  Surgeon: Burnell Blanks, MD;  Location: [facility] CATH LAB;  Service: Cardiovascular;;  . PERCUTANEOUS CORONARY STENT INTERVENTION (PCI-S) N/A 01/03/2012   Procedure: PERCUTANEOUS CORONARY STENT INTERVENTION (PCI-S);  Surgeon: Peter M Martinique, MD;  Location: [facility] CATH LAB;  Service: Cardiovascular;  Laterality: N/A;  . SHOULDER ARTHROSCOPY WITH OPEN ROTATOR CUFF REPAIR AND DISTAL CLAVICLE ACROMINECTOMY Left 02/27/2013   Procedure: LEFT SHOULDER ARTHROSCOPY WITH MINI OPEN ROTATOR CUFF REPAIR AND SUBACROMIAL DECOMPRESSION AND DISTAL CLAVICLE RESECTION;  Surgeon: [name], MD;  Location: [place];  Service: Orthopedics;  Laterality: Left;  . TOTAL KNEE ARTHROPLASTY Right 03/2010   [name]  . TRIGGER FINGER RELEASE Left 02/27/2013   Procedure: RELEASE TRIGGER FINGER/A-1 PULLEY;  Surgeon: [name], MD;  Location: [place];  Service: Orthopedics;  Laterality: Left;       Home Medications    Prior to Admission medications     Medication Sig Start Date End Date Taking? Authorizing Provider  aspirin EC 81 MG tablet Take 81 mg by mouth at bedtime.    [provider]  Blood Glucose Monitoring Suppl (ONE TOUCH ULTRA 2) w/Device KIT Use to obtain blood sugar daily. Dx Code E11.40 07/18/17   [name], MD  carvedilol (COREG) 3.125 MG tablet TAKE 1 TABLET BY MOUTH TWICE DAILY WITH MEALS Patient taking differently: Take 3.125 mg by mouth 2 (two) times daily with a meal.  10/31/18   [name], MD  clopidogrel (PLAVIX) 75 MG tablet TAKE 1 TABLET BY MOUTH ONCE A DAY WITH BREAKFAST. 02/01/19   [name], MD  DHA-Vitamin C-Lutein (EYE HEALTH FORMULA PO) Take 1 tablet by mouth daily.     [provider]  furosemide (LASIX) 40 MG tablet Take 40 mg by mouth at bedtime. Monday, Wednesday,  Friday.    [provider]  glucose blood (ONE TOUCH ULTRA TEST) test strip USE TO CHECK BLOOD SUGAR ONCE A DAY Dx Code E11.40 07/18/17   [name], MD  HYDROcodone-acetaminophen (NORCO) 10-325 MG tablet Take 1 tablet by mouth every 4 (four) hours as needed. For  Chronic pain. Max 150 tablets/month 03/19/19 04/18/19  Gillis Santa, MD  HYDROcodone-acetaminophen (NORCO) 10-325 MG tablet Take 1 tablet by mouth every 4 (four) hours as needed. For  Chronic pain. Max 150 tablets/month 04/18/19 05/18/19  Gillis Santa, MD  HYDROcodone-acetaminophen (NORCO) 10-325 MG tablet Take 1 tablet by mouth every 4 (four) hours as needed. For  Chronic pain. Max 150 tablets/month 05/18/19 06/17/19  Gillis Santa, MD  nitroGLYCERIN (NITROSTAT) 0.4 MG SL tablet DISSOLVE 1 TABLET UNDER TONGUE AS NEEDEDFOR CHEST PAIN. MAY REPEAT 5 MINUTES APART 3 TIMES IF NEEDED. IF NO RELIEF CALL 911 01/06/18   [name], MD  [facility] DELICA LANCETS 40J MISC 1 each by In Vitro route daily. Dx Code E11.49 07/18/17   [name], MD  OXYGEN Inhale into the lungs. Per pt- Uses Oxygen 2 liters at night.    [provider]   pantoprazole (PROTONIX) 40 MG tablet TAKE 1 TABLET BY MOUTH TWICE (2) DAILY Patient taking differently: Take 40 mg by mouth.  10/27/18   [name], MD  polyethylene glycol (MIRALAX / GLYCOLAX) 17 g packet Take 51 g by mouth daily.     [provider]  tamsulosin (FLOMAX) 0.4 MG CAPS capsule Take 0.4 mg by mouth daily. 01/31/19   [provider]    Family History Family History  Problem Relation Age of Onset  . COPD Mother   . Heart disease Father   . Heart attack Father   . Stomach cancer Brother   . Diabetes Brother   . Colon cancer Brother 25  . Alcohol abuse Sister   . Stroke Sister   . Rectal cancer Neg Hx     Social History Social History   Tobacco Use  . Smoking status: Former Smoker    Packs/day: 1.00    Years: 20.00    Pack years: 20.00    Types: Cigarettes    Quit date: 04/06/1963    Years since quitting: 55.9  . Smokeless tobacco: Never Used  Substance Use Topics  . Alcohol use: No    Alcohol/week: 0.0 standard drinks    Comment: 01/01/2012 "last alcohol ~ 50 yr ago"  . Drug use: No     Allergies   Doxazosin mesylate and Methocarbamol   Review of Systems Review of Systems  Constitutional: Positive for fever. Negative for chills.  HENT: Negative for ear pain and sore throat.   Eyes: Negative for pain and visual disturbance.  Respiratory: Positive for cough. Negative for shortness of breath.   Cardiovascular: Negative for chest pain and palpitations.  Gastrointestinal: Negative for abdominal pain, diarrhea, nausea and vomiting.  Genitourinary: Negative for dysuria and hematuria.  Musculoskeletal: Negative for arthralgias and back pain.  Skin: Negative for color change and rash.  Neurological: Negative for seizures and syncope.  All other systems reviewed and are negative.    Physical Exam Triage Vital Signs ED Triage Vitals  Enc Vitals Group     BP 03/19/19 1512 (!) 146/79     Pulse Rate 03/19/19 1512 65     Resp  03/19/19 1512 18     Temp 03/19/19 1512 97.7 F (36.5 C)     Temp  src --      SpO2 03/19/19 1512 96 %     Weight --      Height --      Head Circumference --      Peak Flow --      Pain Score 03/19/19 1504 0     Pain Loc --      Pain Edu? --      Excl. in [place]? --    No data found.  Updated Vital Signs BP (!) 146/79   Pulse 65   Temp 97.7 F (36.5 C)   Resp 18   SpO2 96%   Visual Acuity Right Eye Distance:   Left Eye Distance:   Bilateral Distance:    Right Eye Near:   Left Eye Near:    Bilateral Near:     Physical Exam Vitals and nursing note reviewed.  Constitutional:      General: He is not in acute distress.    Appearance: He is well-developed. He is not ill-appearing.  HENT:     Head: Normocephalic and atraumatic.     Nose: Nose normal.     Mouth/Throat:     Mouth: Mucous membranes are moist.     Pharynx: Oropharynx is clear.  Eyes:     Conjunctiva/sclera: Conjunctivae normal.  Cardiovascular:     Rate and Rhythm: Normal rate and regular rhythm.     Heart sounds: No murmur.  Pulmonary:     Effort: Pulmonary effort is normal. No respiratory distress.     Breath sounds: Normal breath sounds.  Abdominal:     General: Bowel sounds are normal.     Palpations: Abdomen is soft.     Tenderness: There is no abdominal tenderness. There is no guarding or rebound.  Musculoskeletal:     Cervical back: Neck supple.  Skin:    General: Skin is warm and dry.     Findings: No rash.  Neurological:     General: No focal deficit present.     Mental Status: He is alert and oriented to person, place, and time.  Psychiatric:        Mood and Affect: Mood normal.        Behavior: Behavior normal.      UC Treatments / Results  Labs (all labs ordered are listed, but only abnormal results are displayed) Labs Reviewed  POC SARS CORONAVIRUS 2 AG -  ED    EKG   Radiology No results found.  Procedures Procedures (including critical care time)  Medications  Ordered in UC Medications - No data to display  Initial Impression / Assessment and Plan / UC Course  I have reviewed the triage vital signs and the nursing notes.  Pertinent labs & imaging results that were available during my care of the patient were reviewed by me and considered in my medical decision making (see chart for details).   COVID 19.  Rapid COVID test positive.  Instructed patient to take Tylenol as needed for fever.  Instructed him to self quarantine per CDC guidelines.  Instructed him to go to the emergency department if he has high fever, shortness of breath, severe diarrhea, or other symptoms.  Patient agrees to plan of care.     Final Clinical Impressions(s) / UC Diagnoses   Final diagnoses:  Exposure to COVID-19 virus  COVID-19     Discharge Instructions     Your COVID test is positive.    You should self-quarantine for:  *10 days since  your symptoms first appeared and  *24 hours with no fever, without the use of fever-reducing medications and  *your other symptoms of COVID are improving.  Most people do not need to be re-tested at the end of the quarantine period.    Go to the emergency department if you have high fever not relieved by Tylenol, shortness of breath, severe diarrhea, or other concerning symptoms.       ED Prescriptions    None     PDMP not reviewed this encounter.   Sharion Balloon, NP 03/19/19 [phone]

## 2019-03-20 ENCOUNTER — Encounter: Payer: Medicare Other | Admitting: Student in an Organized Health Care Education/Training Program

## 2019-03-21 ENCOUNTER — Encounter: Payer: Self-pay | Admitting: Internal Medicine

## 2019-03-23 ENCOUNTER — Other Ambulatory Visit: Payer: Self-pay | Admitting: Internal Medicine

## 2019-04-02 ENCOUNTER — Telehealth: Payer: Self-pay

## 2019-04-02 NOTE — Telephone Encounter (Signed)
Will await results of that evaluation Check on him tomorrow if no visit in the chart (I will review if he has the visit)

## 2019-04-02 NOTE — Telephone Encounter (Signed)
According to the note from 03-19-19 UC Rapid Covid was positive.

## 2019-04-02 NOTE — Telephone Encounter (Signed)
pts wife (DPR signed) said pt is having chest congestion; no cough or SOB. Pt is very tired and weak. Pt has loss sense of taste. Could not find covid result on 03/19/19. Pt does not have a lot of energy and does not want to get out of bed. pts daughter will take pt to [facility] in [place] or [place] for eval now. FYI to Dr Silvio Pate.

## 2019-04-03 ENCOUNTER — Other Ambulatory Visit: Payer: Self-pay

## 2019-04-03 ENCOUNTER — Telehealth: Payer: Self-pay | Admitting: Infectious Diseases

## 2019-04-03 ENCOUNTER — Encounter (HOSPITAL_COMMUNITY): Payer: Self-pay | Admitting: Emergency Medicine

## 2019-04-03 ENCOUNTER — Emergency Department (HOSPITAL_COMMUNITY): Payer: Medicare Other

## 2019-04-03 ENCOUNTER — Emergency Department (HOSPITAL_COMMUNITY)
Admission: EM | Admit: 2019-04-03 | Discharge: 2019-04-03 | Disposition: A | Discharge status: Home / Self Care | Payer: Medicare Other | Attending: Emergency Medicine | Admitting: Emergency Medicine

## 2019-04-03 DIAGNOSIS — Z743 Need for continuous supervision: Secondary | ICD-10-CM | POA: Diagnosis not present

## 2019-04-03 DIAGNOSIS — R0602 Shortness of breath: Secondary | ICD-10-CM | POA: Diagnosis not present

## 2019-04-03 DIAGNOSIS — E1122 Type 2 diabetes mellitus with diabetic chronic kidney disease: Secondary | ICD-10-CM | POA: Diagnosis not present

## 2019-04-03 DIAGNOSIS — I5031 Acute diastolic (congestive) heart failure: Secondary | ICD-10-CM | POA: Insufficient documentation

## 2019-04-03 DIAGNOSIS — R509 Fever, unspecified: Secondary | ICD-10-CM | POA: Diagnosis not present

## 2019-04-03 DIAGNOSIS — U071 COVID-19: Secondary | ICD-10-CM | POA: Diagnosis not present

## 2019-04-03 DIAGNOSIS — R531 Weakness: Secondary | ICD-10-CM | POA: Insufficient documentation

## 2019-04-03 DIAGNOSIS — Z79899 Other long term (current) drug therapy: Secondary | ICD-10-CM | POA: Insufficient documentation

## 2019-04-03 DIAGNOSIS — E1165 Type 2 diabetes mellitus with hyperglycemia: Secondary | ICD-10-CM | POA: Diagnosis not present

## 2019-04-03 DIAGNOSIS — N183 Chronic kidney disease, stage 3 unspecified: Secondary | ICD-10-CM | POA: Insufficient documentation

## 2019-04-03 DIAGNOSIS — I130 Hypertensive heart and chronic kidney disease with heart failure and stage 1 through stage 4 chronic kidney disease, or unspecified chronic kidney disease: Secondary | ICD-10-CM | POA: Diagnosis not present

## 2019-04-03 DIAGNOSIS — I10 Essential (primary) hypertension: Secondary | ICD-10-CM | POA: Diagnosis not present

## 2019-04-03 HISTORY — DX: COVID-19: U07.1

## 2019-04-03 NOTE — ED Triage Notes (Signed)
Pt to triage via [facility] from home.  COVID + on 12/14.  C/o SOB since yesterday.  Also c/o generalized weakness and fever.  Tylenol within last 1 hr at home.  CBG 319 per EMS.  95% on room air.  Wears 2 liters O2 at home as needed.

## 2019-04-03 NOTE — ED Provider Notes (Signed)
[facility] EMERGENCY DEPARTMENT Provider Note   CSN: [id number] Arrival date & time: 04/03/19  1159     History Chief Complaint  Patient presents with  . COVID +  . Shortness of Breath    [name] is a 85 y.o. male.  HPI Patient reports that he got diagnosed with Covid about 2 weeks ago.  He reports he felt like he was doing pretty well with that.  He was having some congestion and achiness with fever.  He has had some cough as well.  He reports yesterday he did start to feel more short of breath and some general weakness.  He does wear home oxygen at night.  2 L at baseline.  He reports last night he started to feel more short of breath.  He still did not continue to wear his oxygen through the day today.  He reports he feels better now.  He is not feeling any more short of breath at this time.  He reports he lives home with his wife.    Past Medical History:  Diagnosis Date  . Arthritis    osteoarthritis, s/p R TKR, and digits  . CAD (coronary artery disease)    a. s/p CABG (2001)  b. s/p DES to RCA and cutting POBA to ostial PDA (2013)   c. s/p DES to SVG to OM2 (01/14/14) d. cath: 08/2015 NSTEMI w/ patent LIMA-LAD and 99% stenosis of SVG-OM w/ DES placed. CTO of SVG-RCA and SVG-D1.   . Cataract   . Chronic diastolic CHF (congestive heart failure) ([place])    a) 09/13 ECHO- LVEF 40-98%, grade 1 diastolic dysfunction, mild LA dilatation, atrial septal aneurysm, AV mobility restricted, but no sig AS by doppler; b) 09/04/08 ECHO- LVH, ef 60%, mild AS, c. echo 08/2015: EF perserved of 55-60% with inferolateral HK. Mild AS noted.  . Chronic kidney disease, stage III (moderate)   . Chronic lower back pain   . Colon polyps   . COVID-19   . Diverticulosis   . Dyspnea 2009 since July -Sept   05/06/08-[facility]-  normal effort, reduced VO2 max 20.5 /65%, reduced at 8.2/ 40%, normal breathing resetvca of 55%, submaximal heart rate response 112/77%, flattened o2 pluse  response at peak exercise-12 ml/beat @ 85%, No VQ mismatch abnormalities, All c/w CIRC Limitation  . Enlarged prostate   . Esophageal stricture    a. s/p dilation spring 2010  . GERD (gastroesophageal reflux disease)   . Heart murmur   . Hiatal hernia   . History of carpal tunnel syndrome    Bilateral  . History of kidney stones   . History of PFTs    mixed pattern on spiro. mild restn on lung volumes with near normal DLCO. Pattern can be explained by CABG scar. Fev1 2.2L/73%, ratio 68 (67), TLC 4.7/68%,RV 1.5L/55%,DLCO 79%  . Hyperlipidemia   . Hypertension   . Interstitial lung disease ([name])    NOS  . Iron deficiency anemia   . Nausea & vomiting    2018/2019  . On home oxygen therapy    2 L [place] at bedtime  . Osteoporosis   . Overweight (BMI 25.0-29.9)    BMI 29  . Peripheral neuropathy   . RA (rheumatoid arthritis) ([name])    [name]  . Seropositive rheumatoid arthritis ([place])   . Type II diabetes mellitus ([name])    diet controlled  . Wears glasses   . Wears partial dentures    upper  Patient Active Problem List   Diagnosis Date Noted  . Long term prescription opiate use 12/14/2018  . Neuropathy 12/14/2018  . CKD stage 3 due to type 2 diabetes mellitus ([place]) 12/14/2018  . Problems with swallowing and mastication   . Achalasia   . Right inguinal hernia 07/04/2018  . Advance directive discussed with patient 04/28/2018  . Chronic pain of both lower extremities 10/26/2017  . Neuropathic pain 10/26/2017  . Chronic pain syndrome 10/26/2017  . Iron deficiency anemia 07/05/2017  . Constipation 07/05/2017  . CKD (chronic kidney disease) stage 4, GFR 15-29 ml/min ([name]) 05/17/2017  . Seropositive rheumatoid arthritis ([place]) 05/17/2017  . DM (diabetes mellitus) type II controlled, neurological manifestation ([place]) 05/17/2017  . Atherosclerotic heart disease of native coronary artery with angina pectoris ([place]) 05/17/2017  . Dysphagia 05/17/2017  . High risk medication use  10/21/2016  . Primary osteoarthritis of both knees 10/21/2016  . History of right knee joint replacement 10/21/2016  . Orthostatic hypotension 10/01/2016  . Diarrhea 08/18/2016  . Tegretol-induced dizziness 05/14/2016  . Spinal stenosis of lumbar region without neurogenic claudication 03/23/2016  . Spondylosis without myelopathy or radiculopathy, lumbar region 03/23/2016  . Respiratory failure, chronic ([place]) 11/21/2014  . Rheumatoid arthritis ([place]) 11/05/2014  . Rheumatic fever without heart involvement 03/04/2014  . Ventral hernia 12/17/2013  . Routine general medical examination at a health care facility 08/29/2012  . Routine history and physical examination of adult 08/29/2012  . ILD (interstitial lung disease) ([place]) 11/28/2011  . Chronic diastolic heart failure ([place]) 09/14/2011  . Diabetic polyneuropathy associated with type 2 diabetes mellitus ([place]) 08/10/2010  . Obstructive sleep apnea 12/17/2008  . ESOPHAGEAL STRICTURE 10/09/2008  . Esophageal stricture 10/09/2008  . Reflux esophagitis 09/10/2008  . Diverticulosis of colon 09/10/2008  . Gastro-esophageal reflux disease with esophagitis 09/10/2008  . Coronary atherosclerosis 03/19/2008  . Aortic valve disorder 03/19/2008  . Thoracic aorta atherosclerosis ([place]) 03/19/2008  . Actinic keratosis 10/23/2007  . SLEEP DISORDER, CHRONIC 10/17/2006  . Disturbance in sleep behavior 10/17/2006  . 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 Essential hypertension 09/23/2006  . GERD 09/23/2006  . BENIGN PROSTATIC HYPERTROPHY 09/23/2006  . Enlarged prostate without lower urinary tract symptoms (luts) 09/23/2006  . HLD (hyperlipidemia) 09/23/2006    Past Surgical History:  Procedure Laterality Date  . BALLOON DILATION N/A 09/12/2018   Procedure: BALLOON DILATION;  Surgeon: Irene Shipper, MD;  Location: [facility];  Service: Endoscopy;  Laterality: N/A;  . BOTOX INJECTION N/A 09/12/2018   Procedure: BOTOX INJECTION;   Surgeon: Irene Shipper, MD;  Location: [facility];  Service: Endoscopy;  Laterality: N/A;  . BOTOX INJECTION N/A 11/27/2018   Procedure: BOTOX INJECTION;  Surgeon: Irene Shipper, MD;  Location: [facility];  Service: Endoscopy;  Laterality: N/A;  . CARDIAC CATHETERIZATION  08/2004   CP- no MI, Cath- small vessell disease   . CARDIAC CATHETERIZATION  12/31/2011   80% distal LM, 100% native LAD, LCx and RCA, 30% prox SVG-OM, SVG-D1 normal, 99% distal, 80% ostial SVG-RCA distal to graft, LIMA-LAD normal; LVEF mildly decreased with posterior basal AK   . CARDIAC CATHETERIZATION  2009   with patent grafts/notes 12/31/2011  . CARDIAC CATHETERIZATION N/A 08/13/2015   Procedure: Left Heart Cath and Cors/Grafts Angiography;  Surgeon: Sherren Mocha, MD;  Location: [facility];  Service: Cardiovascular;  Laterality: N/A;  . CATARACT EXTRACTION W/ INTRAOCULAR LENS  IMPLANT, BILATERAL Bilateral   . CHOLECYSTECTOMY OPEN  11/2003   [name]  .  CORONARY ANGIOPLASTY WITH STENT PLACEMENT  01/03/2012   Successful DES to SVG-RCA and cutting balloon angioplasty ostial  PDA   . CORONARY ANGIOPLASTY WITH STENT PLACEMENT  01/14/2014   "1"  . CORONARY ARTERY BYPASS GRAFT  11/1999   CABG X5  . CORONARY STENT PLACEMENT  02/2012   1 stent and balloon  . ESOPHAGEAL DILATION  11/27/2018   Procedure: ESOPHAGEAL DILATION;  Surgeon: Irene Shipper, MD;  Location: [facility];  Service: Endoscopy;;  . ESOPHAGOGASTRODUODENOSCOPY N/A 03/01/2017   Procedure: ESOPHAGOGASTRODUODENOSCOPY (EGD);  Surgeon: Irene Shipper, MD;  Location: [facility];  Service: Endoscopy;  Laterality: N/A;  . ESOPHAGOGASTRODUODENOSCOPY (EGD) WITH ESOPHAGEAL DILATION  2010  . ESOPHAGOGASTRODUODENOSCOPY (EGD) WITH PROPOFOL N/A 09/12/2018   Procedure: ESOPHAGOGASTRODUODENOSCOPY (EGD) WITH PROPOFOL;  Surgeon: Irene Shipper, MD;  Location: [facility];  Service: Endoscopy;  Laterality: N/A;  . ESOPHAGOGASTRODUODENOSCOPY (EGD) WITH PROPOFOL N/A  11/27/2018   Procedure: ESOPHAGOGASTRODUODENOSCOPY (EGD) WITH PROPOFOL, WITH BALLOON DILATION;  Surgeon: Irene Shipper, MD;  Location: [facility];  Service: Endoscopy;  Laterality: N/A;  . HAND SURGERY     bilateral carpal tunnel releases  . JOINT REPLACEMENT    . KNEE ARTHROSCOPY Right 2008  . LEFT AND RIGHT HEART CATHETERIZATION WITH CORONARY ANGIOGRAM  12/31/2011   Procedure: LEFT AND RIGHT HEART CATHETERIZATION WITH CORONARY ANGIOGRAM;  Surgeon: Burnell Blanks, MD;  Location: [facility] CATH LAB;  Service: Cardiovascular;;  . LEFT AND RIGHT HEART CATHETERIZATION WITH CORONARY ANGIOGRAM N/A 01/14/2014   Procedure: LEFT AND RIGHT HEART CATHETERIZATION WITH CORONARY ANGIOGRAM;  Surgeon: Peter M Martinique, MD;  Location: [name] CATH LAB;  Service: Cardiovascular;  Laterality: N/A;  . MALONEY DILATION  03/01/2017   Procedure: Venia Minks DILATION;  Surgeon: Irene Shipper, MD;  Location: [facility];  Service: Endoscopy;;  . PERCUTANEOUS CORONARY INTERVENTION-BALLOON ONLY  01/03/2012   Procedure: PERCUTANEOUS CORONARY INTERVENTION-BALLOON ONLY;  Surgeon: Peter M Martinique, MD;  Location: [facility];  Service: Cardiovascular;;  . PERCUTANEOUS CORONARY STENT INTERVENTION (PCI-S)  12/31/2011   Procedure: PERCUTANEOUS CORONARY STENT INTERVENTION (PCI-S);  Surgeon: Burnell Blanks, MD;  Location[facility] C[facility] LAB;  Service: Cardiovascular;;  . PERCUTANEOUS CORONARY STENT INTERVENTION (PCI-S) N/A 01/03/2012   Procedure: PERCUTANEOUS CORONARY STENT INTERVENTION (PCI-S);  Surgeon: Peter M Martinique, MD;  Location: [facility] CATH LAB;  Service: Cardiovascular;  Laterality: N/A;  . SHOULDER ARTHROSCOPY WITH OPEN ROTATOR CUFF REPAIR AND DISTAL CLAVICLE ACROMINECTOMY Left 02/27/2013   Procedure: LEFT SHOULDER ARTHROSCOPY WITH MINI OPEN ROTATOR CUFF REPAIR AND SUBACROMIAL DECOMPRESSION AND DISTAL CLAVICLE RESECTION;  Surgeon: [name], MD;  Location: [facility];  Service: Orthopedics;  Laterality: Left;  . TOTAL KNEE ARTHROPLASTY  Right 03/2010   [name]  . TRIGGER FINGER RELEASE Left 02/27/2013   Procedure: RELEASE TRIGGER FINGER/A-1 PULLEY;  Surgeon: [name], MD;  Location: [facility];  Service: Orthopedics;  Laterality: Left;       Family History  Problem Relation Age of Onset  . COPD Mother   . Heart disease Father   . Heart attack Father   . Stomach cancer Brother   . Diabetes Brother   . Colon cancer Brother 25  . Alcohol abuse Sister   . Stroke Sister   . Rectal cancer Neg Hx     Social History   Tobacco Use  . Smoking status: Former Smoker    Packs/day: 1.00    Years: 20.00    Pack years: 20.00    Types: Cigarettes    Quit  date: 04/06/1963    Years since quitting: 56.0  . Smokeless tobacco: Never Used  Substance Use Topics  . Alcohol use: No    Alcohol/week: 0.0 standard drinks    Comment: 01/01/2012 "last alcohol ~ 50 yr ago"  . Drug use: No    Home Medications Prior to Admission medications   Medication Sig Start Date End Date Taking? Authorizing Provider  aspirin EC 81 MG tablet Take 81 mg by mouth at bedtime.    [provider]  Blood Glucose Monitoring Suppl (ONE TOUCH ULTRA 2) w/Device KIT Use to obtain blood sugar daily. Dx Code E11.40 07/18/17   [name], MD  carvedilol (COREG) 3.125 MG tablet TAKE 1 TABLET BY MOUTH TWICE DAILY WITH MEALS Patient taking differently: Take 3.125 mg by mouth 2 (two) times daily with a meal.  10/31/18   [name], MD  clopidogrel (PLAVIX) 75 MG tablet TAKE 1 TABLET BY MOUTH ONCE A DAY WITH BREAKFAST. 02/01/19   [name], MD  DHA-Vitamin C-Lutein (EYE HEALTH FORMULA PO) Take 1 tablet by mouth daily.     [provider]  furosemide (LASIX) 40 MG tablet Take 40 mg by mouth at bedtime. Monday, Wednesday, Friday.    [provider]  glucose blood (ONE TOUCH ULTRA TEST) test strip USE TO CHECK BLOOD SUGAR ONCE A DAY Dx Code E11.40 07/18/17   [name], MD  HYDROcodone-acetaminophen (NORCO)  10-325 MG tablet Take 1 tablet by mouth every 4 (four) hours as needed. For  Chronic pain. Max 150 tablets/month 03/19/19 04/18/19  Gillis Santa, MD  HYDROcodone-acetaminophen (NORCO) 10-325 MG tablet Take 1 tablet by mouth every 4 (four) hours as needed. For  Chronic pain. Max 150 tablets/month 04/18/19 05/18/19  Gillis Santa, MD  HYDROcodone-acetaminophen (NORCO) 10-325 MG tablet Take 1 tablet by mouth every 4 (four) hours as needed. For  Chronic pain. Max 150 tablets/month 05/18/19 06/17/19  Gillis Santa, MD  nitroGLYCERIN (NITROSTAT) 0.4 MG SL tablet DISSOLVE 1 TABLET UNDER TONGUE AS NEEDEDFOR CHEST PAIN. MAY REPEAT 5 MINUTES APART 3 TIMES IF NEEDED. IF NO RELIEF CALL 911 03/23/19   [name], MD  [facility] DELICA LANCETS 94R MISC 1 each by In Vitro route daily. Dx Code E11.49 07/18/17   [name], MD  OXYGEN Inhale into the lungs. Per pt- Uses Oxygen 2 liters at night.    [provider]  pantoprazole (PROTONIX) 40 MG tablet TAKE 1 TABLET BY MOUTH TWICE (2) DAILY Patient taking differently: Take 40 mg by mouth.  10/27/18   [name], MD  polyethylene glycol (MIRALAX / GLYCOLAX) 17 g packet Take 51 g by mouth daily.     [provider]  tamsulosin (FLOMAX) 0.4 MG CAPS capsule Take 0.4 mg by mouth daily. 01/31/19   [provider]    Allergies    Doxazosin mesylate and Methocarbamol  Review of Systems   Review of Systems 10 Systems reviewed and are negative for acute change except as noted in the HPI.  Physical Exam Updated Vital Signs BP 134/73   Pulse (!) 59   Temp 98 F (36.7 C) (Oral)   Resp (!) 21   SpO2 94%   Physical Exam Constitutional:      Comments: Patient is alert and nontoxic.  No respiratory distress at rest.  Well-nourished well-developed.  HENT:     Head: Normocephalic and atraumatic.     Mouth/Throat:     Mouth: Mucous membranes are moist.  Pharynx: Oropharynx is clear.  Eyes:     Extraocular Movements:  Extraocular movements intact.  Cardiovascular:     Rate and Rhythm: Normal rate and regular rhythm.  Pulmonary:     Effort: Pulmonary effort is normal.     Breath sounds: Normal breath sounds.  Abdominal:     General: There is no distension.     Palpations: Abdomen is soft.     Tenderness: There is no abdominal tenderness. There is no guarding.  Musculoskeletal:        General: No swelling or tenderness. Normal range of motion.  Skin:    General: Skin is warm and dry.  Neurological:     General: No focal deficit present.     Mental Status: He is oriented to person, place, and time.     Coordination: Coordination normal.  Psychiatric:        Mood and Affect: Mood normal.     ED Results / Procedures / Treatments   Labs (all labs ordered are listed, but only abnormal results are displayed) Labs Reviewed - No data to display  EKG EKG Interpretation  Date/Time:  Tuesday April 03 2019 12:03:02 EST Ventricular Rate:  66 PR Interval:  142 QRS Duration: 98 QT Interval:  402 QTC Calculation: 421 R Axis:   -12 Text Interpretation: Normal sinus rhythm ST & T wave abnormality, consider inferior ischemia Abnormal ECG baseline artifact, o/w similar to previous Confirmed by Charlesetta Shanks [id number]) on 04/03/2019 3:40:28 PM   Radiology DG Chest Portable 1 View  Result Date: 04/03/2019 CLINICAL DATA:  Shortness of breath and fevers, COVID-19 positivity EXAM: PORTABLE CHEST 1 VIEW COMPARISON:  02/06/2019 FINDINGS: Cardiac shadows within normal limits. Postsurgical changes are seen. The lungs are well aerated bilaterally without focal infiltrate or sizable effusion. No acute bony abnormality is seen. IMPRESSION: No active disease. Electronically Signed   By: Inez Catalina M.D.   On: 04/03/2019 13:06    Procedures Procedures (including critical care time)  Medications Ordered in ED Medications - No data to display  ED Course  I have reviewed the triage vital signs and the nursing  notes.  Pertinent labs & imaging results that were available during my care of the patient were reviewed by me and considered in my medical decision making (see chart for details).    MDM Rules/Calculators/A&P                      Patient is at day 20 from symptoms onset of Covid.  He did perceive to be slightly more short of breath last night.  Clinically, patient is well in appearance.  He is not in any distress.  On room air his oxygen saturation is 95%.  Patient does wear home oxygen at night at 2 L.  Chest x-ray does not show any pneumonia pattern.  At this time he does appear stable to continue outpatient management.  His information has been forwarded through [facility] provider to determine if he might be a candidate for outpatient infusion therapy through infectious disease.  Return precautions reviewed.   Final Clinical Impression(s) / ED Diagnoses Final diagnoses:  [id number]    Rx / DC Orders ED Discharge Orders    None       Charlesetta Shanks, MD 04/03/19 [phone]

## 2019-04-03 NOTE — ED Notes (Signed)
ED Provider at bedside. 

## 2019-04-03 NOTE — Telephone Encounter (Signed)
I reviewed [name]'s chart regarding consideration for treatment with bamlanivimab, a monoclonal antibody infusion for those with mild to moderate Covid symptoms and at a high risk of hospitalization.  Per chart review patient has had symptoms beyond 10 days and not eligible for infusion unfortunately.

## 2019-04-03 NOTE — Discharge Instructions (Addendum)
Your information has been forwarded to the infectious disease specialists for review.  You may be contacted to get outpatient medications for Covid.  This will be done through the infectious disease doctors.  Dr. Storm Frisk contact information is in your discharge instructions.  Call their office to review further treatment options. Continue your regular home medications.  Continue to monitor your oxygen saturation.  Use your nighttime oxygen 2 L as per usual.  Return to the emergency department if your symptoms are worsening.

## 2019-04-04 ENCOUNTER — Telehealth: Payer: Self-pay

## 2019-04-04 NOTE — Telephone Encounter (Signed)
Spoke to [employer]. She said she took him to the [facility] UC in [place] on 04-02-19 and they would not see him.   She spoke to him a few minutes ago and he said he is feeling a little better.   I was unsure what the hours were and the actual location of the Respiratory Clinic. Do you know?

## 2019-04-04 NOTE — Telephone Encounter (Signed)
I left message for patient to return phone call, as well as a message for [name].   I believe respiratory clinic is open M, W, F from 5pm-7pm.

## 2019-04-04 NOTE — Telephone Encounter (Signed)
He would need to be seen. Any openings today or can we set him up for Saturday Clinic or the Respiratory Clinic? Would recommend UC or ER if worse.

## 2019-04-04 NOTE — Telephone Encounter (Signed)
He went to the ER yesterday

## 2019-04-04 NOTE — Telephone Encounter (Signed)
Patient's daughter [name] called and stated that patient was diagnosed with COVID on 03/19/19 and he was seen in the ER yesterday due to feeling short of breath and chest pain. [name] states that patient was worked up in the ER and everything came back ok, and they sent him home. [name] states that the patient is saying that during his whole time of being diagnosed with COVID, he has felt the worst starting yesterday & today. Patient states he is refusing to go back to the ER, because they will only do the same thing they did yesterday and just send him back home. She states he is still having SHOB, mild productive cough with yellow phlegm, and hoarseness of voice. She knows he cannot come into the office, is asking if anything can be sent in to help him with these symptoms?  In Dr. Alla German absence, I will also route to [facility]. Thanks!

## 2019-04-05 NOTE — Telephone Encounter (Signed)
I left 2nd message for patient & patient's daughter to return phone call.

## 2019-04-09 ENCOUNTER — Telehealth: Payer: Self-pay

## 2019-04-09 NOTE — Telephone Encounter (Signed)
Pt said someone told him that the government was handing out free pulse ox to doctors offices and pt wants to know if Dr Silvio Pate has free pulse ox. Pt left v/m requesting cb. Pt last seen 10/27/18.

## 2019-04-10 NOTE — Telephone Encounter (Signed)
I have not heard anything about this. They are fairly inexpensive in any store (pharmacy, [place], etc)

## 2019-04-10 NOTE — Telephone Encounter (Signed)
Spoke to pt. Advised him that we did not receive any free pulse ox machines. He has been pricing them.

## 2019-04-11 NOTE — Telephone Encounter (Signed)
Okay I can review things by phone or video if possible---and discuss coming off quarantine, etc

## 2019-04-11 NOTE — Telephone Encounter (Addendum)
[name] said pt has not gotten cb. Advised attempted to call pt x 2. [name] wants to make sure he is OK to stop quarantine. Pt last seen ED on 04/03/19. Now pt is not having any real covid symptoms. Pt has been eating better last 2 days. Pt does have nausea a lot and pt does have nausea. Pt is also tired. [name] request call to pt (pt cannot do virtual) as phone visit on 04/11/18 at [facility]. Pt tested positive for covid mid Dec and has been on quarantine since then.pt has not had FU call from health dept. [name] wants pt to talk with Dr Silvio Pate to advise pt about coming out of quarantine and any other questions pt may have. [name] will try to be there as well if she can. FYI to Dr Silvio Pate.

## 2019-04-12 ENCOUNTER — Other Ambulatory Visit: Payer: Self-pay

## 2019-04-12 ENCOUNTER — Encounter: Payer: Self-pay | Admitting: Internal Medicine

## 2019-04-12 ENCOUNTER — Telehealth: Payer: Self-pay

## 2019-04-12 ENCOUNTER — Ambulatory Visit (INDEPENDENT_AMBULATORY_CARE_PROVIDER_SITE_OTHER): Payer: Medicare Other | Admitting: Internal Medicine

## 2019-04-12 DIAGNOSIS — U071 COVID-19: Secondary | ICD-10-CM

## 2019-04-12 MED ORDER — ONETOUCH ULTRA 2 W/DEVICE KIT: PACK | 0 refills | Status: DC

## 2019-04-12 NOTE — Telephone Encounter (Signed)
Spoke to [place]. Advised him pt only asked for a meter.

## 2019-04-12 NOTE — Progress Notes (Signed)
Subjective:    Patient ID: [name], male    DOB: [birth year], 86 y.o.   MRN: [id number]  HPI Phone visit due to ongoing [place] concerns  Virtual Visit via Telephone Note  I connected with [name] on 04/12/19 at  4:00 PM EST by telephone and verified that I am speaking with the correct person using two identifiers.  Location: Patient: home--wife also present Provider: office   I discussed the limitations, risks, security and privacy concerns of performing an evaluation and management service by telephone and the availability of in person appointments. I also discussed with the patient that there may be a patient responsible charge related to this service. The patient expressed understanding and agreed to proceed.   History of Present Illness: Was seen at urgent care and tested positive for COVID on 12/14 Sent home then Worsened and some SOB--seen in ER 12/29 CXR okay and sent home again  Continues on his usual oxygen--nighttime Some improvement over the past 2-3 days No congestion now Breathing is better now Eating pretty good No fevers No N/V No diarrhea Smell and taste are okay  Current Outpatient Medications on File Prior to Visit  Medication Sig Dispense Refill  . aspirin EC 81 MG tablet Take 81 mg by mouth at bedtime.    . carvedilol (COREG) 3.125 MG tablet TAKE 1 TABLET BY MOUTH TWICE DAILY WITH MEALS (Patient taking differently: Take 3.125 mg by mouth 2 (two) times daily with a meal. ) 180 tablet 3  . clopidogrel (PLAVIX) 75 MG tablet TAKE 1 TABLET BY MOUTH ONCE A DAY WITH BREAKFAST. 90 tablet 2  . DHA-Vitamin C-Lutein (EYE HEALTH FORMULA PO) Take 1 tablet by mouth daily.     . furosemide (LASIX) 40 MG tablet Take 40 mg by mouth at bedtime. Monday, Wednesday, Friday.    [name] glucose blood (ONE TOUCH ULTRA TEST) test strip USE TO CHECK BLOOD SUGAR ONCE A DAY Dx Code E11.40 100 each 3  . HYDROcodone-acetaminophen (NORCO) 10-325 MG tablet Take 1 tablet by  mouth every 4 (four) hours as needed. For  Chronic pain. Max 150 tablets/month 150 tablet 0  . [START ON 04/18/2019] HYDROcodone-acetaminophen (NORCO) 10-325 MG tablet Take 1 tablet by mouth every 4 (four) hours as needed. For  Chronic pain. Max 150 tablets/month 150 tablet 0  . [START ON 05/18/2019] HYDROcodone-acetaminophen (NORCO) 10-325 MG tablet Take 1 tablet by mouth every 4 (four) hours as needed. For  Chronic pain. Max 150 tablets/month 150 tablet 0  . nitroGLYCERIN (NITROSTAT) 0.4 MG SL tablet DISSOLVE 1 TABLET UNDER TONGUE AS NEEDEDFOR CHEST PAIN. MAY REPEAT 5 MINUTES APART 3 TIMES IF NEEDED. IF NO RELIEF CALL 911 25 tablet 0  . ONETOUCH DELICA LANCETS 46E MISC 1 each by In Vitro route daily. Dx Code E11.49 100 each 3  . OXYGEN Inhale into the lungs. Per pt- Uses Oxygen 2 liters at night.    . pantoprazole (PROTONIX) 40 MG tablet TAKE 1 TABLET BY MOUTH TWICE (2) DAILY (Patient taking differently: Take 40 mg by mouth. ) 180 tablet 3  . polyethylene glycol (MIRALAX / GLYCOLAX) 17 g packet Take 51 g by mouth daily.     . tamsulosin (FLOMAX) 0.4 MG CAPS capsule Take 0.4 mg by mouth daily.     No current facility-administered medications on file prior to visit.    Allergies  Allergen Reactions  . Doxazosin Mesylate Other (See Comments)    dizziness  . Methocarbamol Rash  Past Medical History:  Diagnosis Date  . Arthritis    osteoarthritis, s/p R TKR, and digits  . CAD (coronary artery disease)    a. s/p CABG (2001)  b. s/p DES to RCA and cutting POBA to ostial PDA (2013)   c. s/p DES to SVG to OM2 (01/14/14) d. cath: 08/2015 NSTEMI w/ patent LIMA-LAD and 99% stenosis of SVG-OM w/ DES placed. CTO of SVG-RCA and SVG-D1.   . Cataract   . Chronic diastolic CHF (congestive heart failure) ([place])    a) 09/13 ECHO- LVEF 41-63%, grade 1 diastolic dysfunction, mild LA dilatation, atrial septal aneurysm, AV mobility restricted, but no sig AS by doppler; b) 09/04/08 ECHO- LVH, ef 60%, mild AS, c.  echo 08/2015: EF perserved of 55-60% with inferolateral HK. Mild AS noted.  . Chronic kidney disease, stage III (moderate)   . Chronic lower back pain   . Colon polyps   . COVID-19   . Diverticulosis   . Dyspnea 2009 since July -Sept   05/06/08-[facility]-  normal effort, reduced VO2 max 20.5 /65%, reduced at 8.2/ 40%, normal breathing resetvca of 55%, submaximal heart rate response 112/77%, flattened o2 pluse response at peak exercise-12 ml/beat @ 85%, No VQ mismatch abnormalities, All c/w CIRC Limitation  . Enlarged prostate   . Esophageal stricture    a. s/p dilation spring 2010  . GERD (gastroesophageal reflux disease)   . Heart murmur   . Hiatal hernia   . History of carpal tunnel syndrome    Bilateral  . History of kidney stones   . History of PFTs    mixed pattern on spiro. mild restn on lung volumes with near normal DLCO. Pattern can be explained by CABG scar. Fev1 2.2L/73%, ratio 68 (67), TLC 4.7/68%,RV 1.5L/55%,DLCO 79%  . Hyperlipidemia   . Hypertension   . Interstitial lung disease ([name])    NOS  . Iron deficiency anemia   . Nausea & vomiting    2018/2019  . On home oxygen therapy    2 L [place] at bedtime  . Osteoporosis   . Overweight (BMI 25.0-29.9)    BMI 29  . Peripheral neuropathy   . RA (rheumatoid arthritis) ([name])    [name]  . Seropositive rheumatoid arthritis ([place])   . Type II diabetes mellitus ([name])    diet controlled  . Wears glasses   . Wears partial dentures    upper    Past Surgical History:  Procedure Laterality Date  . BALLOON DILATION N/A 09/12/2018   Procedure: BALLOON DILATION;  Surgeon: Irene Shipper, MD;  Location: [facility];  Service: Endoscopy;  Laterality: N/A;  . BOTOX INJECTION N/A 09/12/2018   Procedure: BOTOX INJECTION;  Surgeon: Irene Shipper, MD;  Location: [facility];  Service: Endoscopy;  Laterality: N/A;  . BOTOX INJECTION N/A 11/27/2018   Procedure: BOTOX INJECTION;  Surgeon: Irene Shipper, MD;  Location: [facility];  Service:  Endoscopy;  Laterality: N/A;  . CARDIAC CATHETERIZATION  08/2004   CP- no MI, Cath- small vessell disease   . CARDIAC CATHETERIZATION  12/31/2011   80% distal LM, 100% native LAD, LCx and RCA, 30% prox SVG-OM, SVG-D1 normal, 99% distal, 80% ostial SVG-RCA distal to graft, LIMA-LAD normal; LVEF mildly decreased with posterior basal AK   . CARDIAC CATHETERIZATION  2009   with patent grafts/notes 12/31/2011  . CARDIAC CATHETERIZATION N/A 08/13/2015   Procedure: Left Heart Cath and Cors/Grafts Angiography;  Surgeon: Sherren Mocha, MD;  Location: [facility];  Service: Cardiovascular;  Laterality: N/A;  . CATARACT EXTRACTION W/ INTRAOCULAR LENS  IMPLANT, BILATERAL Bilateral   . CHOLECYSTECTOMY OPEN  11/2003   [name]  . CORONARY ANGIOPLASTY WITH STENT PLACEMENT  01/03/2012   Successful DES to SVG-RCA and cutting balloon angioplasty ostial  PDA   . CORONARY ANGIOPLASTY WITH STENT PLACEMENT  01/14/2014   "1"  . CORONARY ARTERY BYPASS GRAFT  11/1999   CABG X5  . CORONARY STENT PLACEMENT  02/2012   1 stent and balloon  . ESOPHAGEAL DILATION  11/27/2018   Procedure: ESOPHAGEAL DILATION;  Surgeon: Irene Shipper, MD;  Location: [facility];  Service: Endoscopy;;  . ESOPHAGOGASTRODUODENOSCOPY N/A 03/01/2017   Procedure: ESOPHAGOGASTRODUODENOSCOPY (EGD);  Surgeon: Irene Shipper, MD;  Location: [facility];  Service: Endoscopy;  Laterality: N/A;  . ESOPHAGOGASTRODUODENOSCOPY (EGD) WITH ESOPHAGEAL DILATION  2010  . ESOPHAGOGASTRODUODENOSCOPY (EGD) WITH PROPOFOL N/A 09/12/2018   Procedure: ESOPHAGOGASTRODUODENOSCOPY (EGD) WITH PROPOFOL;  Surgeon: Irene Shipper, MD;  Location: [facility];  Service: Endoscopy;  Laterality: N/A;  . ESOPHAGOGASTRODUODENOSCOPY (EGD) WITH PROPOFOL N/A 11/27/2018   Procedure: ESOPHAGOGASTRODUODENOSCOPY (EGD) WITH PROPOFOL, WITH BALLOON DILATION;  Surgeon: Irene Shipper, MD;  Location: [facility];  Service: Endoscopy;  Laterality: N/A;  . HAND SURGERY     bilateral carpal  tunnel releases  . JOINT REPLACEMENT    . KNEE ARTHROSCOPY Right 2008  . LEFT AND RIGHT HEART CATHETERIZATION WITH CORONARY ANGIOGRAM  12/31/2011   Procedure: LEFT AND RIGHT HEART CATHETERIZATION WITH CORONARY ANGIOGRAM;  Surgeon: Burnell Blanks, MD;  Location: [facility] CATH LAB;  Service: Cardiovascular;;  . LEFT AND RIGHT HEART CATHETERIZATION WITH CORONARY ANGIOGRAM N/A 01/14/2014   Procedure: LEFT AND RIGHT HEART CATHETERIZATION WITH CORONARY ANGIOGRAM;  Surgeon: Peter M Martinique, MD;  Location: [facility] CATH LAB;  Service: Cardiovascular;  Laterality: N/A;  . MALONEY DILATION  03/01/2017   Procedure: Venia Minks DILATION;  Surgeon: Irene Shipper, MD;  Location: [facility];  Service: Endoscopy;;  . PERCUTANEOUS CORONARY INTERVENTION-BALLOON ONLY  01/03/2012   Procedure: PERCUTANEOUS CORONARY INTERVENTION-BALLOON ONLY;  Surgeon: Peter M Martinique, MD;  Location: [facility] CATH LAB;  Service: Cardiovascular;;  . PERCUTANEOUS CORONARY STENT INTERVENTION (PCI-S)  12/31/2011   Procedure: PERCUTANEOUS CORONARY STENT INTERVENTION (PCI-S);  Surgeon: Burnell Blanks, MD;  Location: [facility] CATH LAB;  Service: Cardiovascular;;  . PERCUTANEOUS CORONARY STENT INTERVENTION (PCI-S) N/A 01/03/2012   Procedure: PERCUTANEOUS CORONARY STENT INTERVENTION (PCI-S);  Surgeon: Peter M Martinique, MD;  Location[facility];  Service: Cardiovascular;  Laterality: N/A;  . SHOULDER ARTHROSCOPY WITH OPEN ROTATOR CUFF REPAIR AND DISTAL CLAVICLE ACROMINECTOMY Left 02/27/2013   Procedure: LEFT SHOULDER ARTHROSCOPY WITH MINI OPEN ROTATOR CUFF REPAIR AND SUBACROMIAL DECOMPRESSION AND DISTAL CLAVICLE RESECTION;  Surgeon: [name], MD;  Location: [facility];  Service: Orthopedics;  Laterality: Left;  . TOTAL KNEE ARTHROPLASTY Right 03/2010   [name]  . TRIGGER FINGER RELEASE Left 02/27/2013   Procedure: RELEASE TRIGGER FINGER/A-1 PULLEY;  Surgeon: [name], MD;  Location: [facility];  Service: Orthopedics;  Laterality: Left;    Family  History  Problem Relation Age of Onset  . COPD Mother   . Heart disease Father   . Heart attack Father   . Stomach cancer Brother   . Diabetes Brother   . Colon cancer Brother 25  . Alcohol abuse Sister   . Stroke Sister   . Rectal cancer Neg Hx     Social History   Socioeconomic History  . Marital status:  Married    Spouse name: Not on file  . Number of children: 3  . Years of education: Not on file  . Highest education level: Not on file  Occupational History  . Occupation: Designer, jewellery: RETIRED    Comment: retired  Tobacco Use  . Smoking status: Former Smoker    Packs/day: 1.00    Years: 20.00    Pack years: 20.00    Types: Cigarettes    Quit date: 04/06/1963    Years since quitting: 56.0  . Smokeless tobacco: Never Used  Substance and Sexual Activity  . Alcohol use: No    Alcohol/week: 0.0 standard drinks    Comment: 01/01/2012 "last alcohol ~ 50 yr ago"  . Drug use: No  . Sexual activity: Not Currently  Other Topics Concern  . Not on file  Social History Narrative   No living will   Requests wife as health care POA-- alternate is daughter Hassan Rowan   Discussed DNR --he requests this (done 08/29/12)   Not sure about feeding tube---but might accept for some time   Patient lives with wife and daughter in a one story home.  Has 3 children.  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   Social Determinants of Health   Financial Resource Strain:   . Difficulty of Paying Living Expenses: Not on file  Food Insecurity:   . Worried About Charity fundraiser in the Last Year: Not on file  . Ran Out of Food in the Last Year: Not on file  Transportation Needs:   . Lack of Transportation (Medical): Not on file  . Lack of Transportation (Non-Medical): Not on file  Physical Activity:   . Days of Exercise per Week: Not on file  . Minutes of Exercise per Session: Not on file  Stress:   . Feeling of Stress : Not on file  Social Connections:   .  Frequency of Communication with Friends and Family: Not on file  . Frequency of Social Gatherings with Friends and Family: Not on file  . Attends Religious Services: Not on file  . Active Member of Clubs or Organizations: Not on file  . Attends Archivist Meetings: Not on file  . Marital Status: Not on file  Intimate Partner Violence:   . Fear of Current or Ex-Partner: Not on file  . Emotionally Abused: Not on file  . Physically Abused: Not on file  . Sexually Abused: Not on file     Observations/Objective: Voice is fairly strong No apparent respiratory difficulty  Assessment and Plan:   Follow Up Instructions:    I discussed the assessment and treatment plan with the patient. The patient was provided an opportunity to ask questions and all were answered. The patient agreed with the plan and demonstrated an understanding of the instructions.   The patient was advised to call back or seek an in-person evaluation if the symptoms worsen or if the condition fails to improve as anticipated.  I provided 11 minutes of non-face-to-face time during this encounter.   Viviana Simpler, MD    Review of Systems     Objective:   Physical Exam         Assessment & Plan:

## 2019-04-12 NOTE — Assessment & Plan Note (Signed)
Has been sick for just about a month Diagnosed by rapid test 12/14 Worsened and was seen in ER 12/29---reviewed notes and benign CXR Now finally improving Discussed that he can come off quarantine now--but still stay socially distant, etc He should still get vaccine--doesn't have to rush for that

## 2019-04-12 NOTE — Telephone Encounter (Signed)
Chris with [facility] left v/m requesting refill on one touch test strips and delica lancets and for ins purpose please include how often pt is to test daily.

## 2019-04-12 NOTE — Telephone Encounter (Signed)
Pt only asked for a new meter because his stopped working. He did not say he needed anything else.

## 2019-04-18 DIAGNOSIS — R0689 Other abnormalities of breathing: Secondary | ICD-10-CM | POA: Diagnosis not present

## 2019-04-18 DIAGNOSIS — J8410 Pulmonary fibrosis, unspecified: Secondary | ICD-10-CM | POA: Diagnosis not present

## 2019-04-25 DIAGNOSIS — N1832 Chronic kidney disease, stage 3b: Secondary | ICD-10-CM | POA: Diagnosis not present

## 2019-05-01 ENCOUNTER — Encounter: Payer: Medicare Other | Admitting: Internal Medicine

## 2019-05-19 DIAGNOSIS — R0689 Other abnormalities of breathing: Secondary | ICD-10-CM | POA: Diagnosis not present

## 2019-05-19 DIAGNOSIS — J8410 Pulmonary fibrosis, unspecified: Secondary | ICD-10-CM | POA: Diagnosis not present

## 2019-05-23 DIAGNOSIS — H353221 Exudative age-related macular degeneration, left eye, with active choroidal neovascularization: Secondary | ICD-10-CM | POA: Diagnosis not present

## 2019-05-23 DIAGNOSIS — H353213 Exudative age-related macular degeneration, right eye, with inactive scar: Secondary | ICD-10-CM | POA: Diagnosis not present

## 2019-06-05 DIAGNOSIS — N1832 Chronic kidney disease, stage 3b: Secondary | ICD-10-CM | POA: Diagnosis not present

## 2019-06-11 ENCOUNTER — Encounter: Payer: Self-pay | Admitting: Student in an Organized Health Care Education/Training Program

## 2019-06-12 ENCOUNTER — Ambulatory Visit
Payer: Medicare Other | Attending: Student in an Organized Health Care Education/Training Program | Admitting: Student in an Organized Health Care Education/Training Program

## 2019-06-12 ENCOUNTER — Other Ambulatory Visit: Payer: Self-pay

## 2019-06-12 DIAGNOSIS — M792 Neuralgia and neuritis, unspecified: Secondary | ICD-10-CM

## 2019-06-12 NOTE — Progress Notes (Signed)
I attempted to call the patient however no response. Voicemail left instructing patient to call front desk office at [phone] to reschedule appointment. -Dr Sheryll Dymek  

## 2019-06-13 ENCOUNTER — Other Ambulatory Visit: Payer: Self-pay

## 2019-06-13 ENCOUNTER — Encounter: Payer: Self-pay | Admitting: Internal Medicine

## 2019-06-13 ENCOUNTER — Ambulatory Visit (INDEPENDENT_AMBULATORY_CARE_PROVIDER_SITE_OTHER): Payer: Medicare Other | Admitting: Internal Medicine

## 2019-06-13 VITALS — BP 132/80 | HR 72 | Temp 98.3°F | Ht 71.5 in | Wt 196.0 lb

## 2019-06-13 DIAGNOSIS — J9611 Chronic respiratory failure with hypoxia: Secondary | ICD-10-CM

## 2019-06-13 DIAGNOSIS — I25119 Atherosclerotic heart disease of native coronary artery with unspecified angina pectoris: Secondary | ICD-10-CM | POA: Diagnosis not present

## 2019-06-13 DIAGNOSIS — Z0000 Encounter for general adult medical examination without abnormal findings: Secondary | ICD-10-CM

## 2019-06-13 DIAGNOSIS — I5032 Chronic diastolic (congestive) heart failure: Secondary | ICD-10-CM | POA: Diagnosis not present

## 2019-06-13 DIAGNOSIS — I700 Atherosclerosis of aorta: Secondary | ICD-10-CM

## 2019-06-13 DIAGNOSIS — Z7189 Other specified counseling: Secondary | ICD-10-CM

## 2019-06-13 DIAGNOSIS — N184 Chronic kidney disease, stage 4 (severe): Secondary | ICD-10-CM | POA: Diagnosis not present

## 2019-06-13 DIAGNOSIS — E1140 Type 2 diabetes mellitus with diabetic neuropathy, unspecified: Secondary | ICD-10-CM | POA: Diagnosis not present

## 2019-06-13 DIAGNOSIS — J849 Interstitial pulmonary disease, unspecified: Secondary | ICD-10-CM

## 2019-06-13 LAB — CBC
HCT: 41.4 % (ref 39.0–52.0)
Hemoglobin: 14.1 g/dL (ref 13.0–17.0)
MCHC: 34 g/dL (ref 30.0–36.0)
MCV: 86.6 fl (ref 78.0–100.0)
Platelets: 199 10*3/uL (ref 150.0–400.0)
RBC: 4.78 Mil/uL (ref 4.22–5.81)
RDW: 14.2 % (ref 11.5–15.5)
WBC: 7 10*3/uL (ref 4.0–10.5)

## 2019-06-13 LAB — HEPATIC FUNCTION PANEL
ALT: 9 U/L (ref 0–53)
AST: 14 U/L (ref 0–37)
Albumin: 4.1 g/dL (ref 3.5–5.2)
Alkaline Phosphatase: 58 U/L (ref 39–117)
Bilirubin, Direct: 0.1 mg/dL (ref 0.0–0.3)
Total Bilirubin: 0.8 mg/dL (ref 0.2–1.2)
Total Protein: 8 g/dL (ref 6.0–8.3)

## 2019-06-13 LAB — RENAL FUNCTION PANEL
Albumin: 4.1 g/dL (ref 3.5–5.2)
BUN: 33 mg/dL — ABNORMAL HIGH (ref 6–23)
CO2: 33 mEq/L — ABNORMAL HIGH (ref 19–32)
Calcium: 9.9 mg/dL (ref 8.4–10.5)
Chloride: 96 mEq/L (ref 96–112)
Creatinine, Ser: 2.06 mg/dL — ABNORMAL HIGH (ref 0.40–1.50)
GFR: 30.78 mL/min — ABNORMAL LOW (ref >60.00)
Glucose, Bld: 287 mg/dL — ABNORMAL HIGH (ref 70–99)
Phosphorus: 2.9 mg/dL (ref 2.3–4.6)
Potassium: 4.6 mEq/L (ref 3.5–5.1)
Sodium: 133 mEq/L — ABNORMAL LOW (ref 135–145)

## 2019-06-13 LAB — LDL CHOLESTEROL, DIRECT: Direct LDL: 125 mg/dL

## 2019-06-13 LAB — LIPID PANEL
Cholesterol: 209 mg/dL — ABNORMAL HIGH (ref 0–200)
HDL: 35.5 mg/dL — ABNORMAL LOW (ref >39.00)
NonHDL: 173.14
Total CHOL/HDL Ratio: 6
Triglycerides: 275 mg/dL — ABNORMAL HIGH (ref 0.0–149.0)
VLDL: 55 mg/dL — ABNORMAL HIGH (ref 0.0–40.0)

## 2019-06-13 LAB — HEMOGLOBIN A1C: Hgb A1c MFr Bld: 8 % — ABNORMAL HIGH (ref 4.6–6.5)

## 2019-06-13 NOTE — Assessment & Plan Note (Signed)
I have personally reviewed the Medicare Annual Wellness questionnaire and have noted 1. The patient's medical and social history 2. Their use of alcohol, tobacco or illicit drugs 3. Their current medications and supplements 4. The patient's functional ability including ADL's, fall risks, home safety risks and hearing or visual             impairment. 5. Diet and physical activities 6. Evidence for depression or mood disorders  The patients weight, height, BMI and visual acuity have been recorded in the chart I have made referrals, counseling and provided education to the patient based review of the above and I have provided the pt with a written personalized care plan for preventive services.  I have provided you with a copy of your personalized plan for preventive services. Please take the time to review along with your updated medication list.  Had COVID vaccines Yearly flu vaccine No cancer screening due to age

## 2019-06-13 NOTE — Assessment & Plan Note (Signed)
Currently no Rx Follows with Dr Chase Caller

## 2019-06-13 NOTE — Assessment & Plan Note (Signed)
On CT scan Still on ASA and plavix

## 2019-06-13 NOTE — Assessment & Plan Note (Signed)
Will recheck Had labs but no access

## 2019-06-13 NOTE — Assessment & Plan Note (Signed)
Is compensated No med changes needed

## 2019-06-13 NOTE — Assessment & Plan Note (Signed)
Lab Results  Component Value Date   HGBA1C 8.9 (A) 10/27/2018   Will recheck  Reluctant to treat unless A1c goes over 9% (no metformin due to CKD, no actos--CHF. Will evaluate based on labs) Chronic extremity pain with neuropathy

## 2019-06-13 NOTE — Assessment & Plan Note (Signed)
Chronic DOE---unclear if anginal equivalent Hasn't needed nitro lately

## 2019-06-13 NOTE — Progress Notes (Signed)
Subjective:    Patient ID: [name], male    DOB: Jul 02, 1931, 86 y.o.   MRN: [id number]  HPI Here for Medicare wellness visit and follow up of chronic health conditions This visit occurred during the SARS-CoV-2 public health emergency.  Safety protocols were in place, including screening questions prior to the visit, additional usage of staff PPE, and extensive cleaning of exam room while observing appropriate contact time as indicated for disinfecting solutions.   Reviewed advanced directives Reviewed other doctors---- Dr Shelda Altes, Dr Mliss Fritz, Dr Bettye Boeck, Dr Nishan--cardiology, Dr Orlena Sheldon, Dr Henrene Pastor --GI, Dr Dot Been specialist, Dr Oren Bracket No alcohol or tobacco No surgery or hospital stays in the past year Vision is poor---no recent change Hearing aides--help some No falls Does ride bike, lift weights, walks No regular depression or anhedonia Able to do instrumental ADLs Mild memory issues---he relates to age (nothing worrisome)  Checks sugars once or twice a week Over 200 at times 187 earlier Known CKD 4--just had blood work done for nephrology  Ongoing pain in feet Continues with the pain doctor  Breathing is about the same Uses the oxygen at night--- 2l/min Hasn't needed it in the day No regular cough No edema No chest pain Gets palpitations in the early morning--brief sense of racing (5-10 seconds) No dizziness or syncope  Aortic atherosclerosis again seen on recent chest CT  Current Outpatient Medications on File Prior to Visit  Medication Sig Dispense Refill  . aspirin EC 81 MG tablet Take 81 mg by mouth at bedtime.    . Blood Glucose Monitoring Suppl (ONE TOUCH ULTRA 2) w/Device KIT Use to obtain blood sugar daily. Dx Code E11.40 1 kit 0  . carvedilol (COREG) 3.125 MG tablet TAKE 1 TABLET BY MOUTH TWICE DAILY WITH MEALS (Patient taking differently: Take 3.125 mg by mouth 2 (two) times daily with a  meal. ) 180 tablet 3  . clopidogrel (PLAVIX) 75 MG tablet TAKE 1 TABLET BY MOUTH ONCE A DAY WITH BREAKFAST. 90 tablet 2  . DHA-Vitamin C-Lutein (EYE HEALTH FORMULA PO) Take 1 tablet by mouth daily.     . furosemide (LASIX) 40 MG tablet Take 40 mg by mouth at bedtime. Monday, Wednesday, Friday.    [name] glucose blood (ONE TOUCH ULTRA TEST) test strip USE TO CHECK BLOOD SUGAR ONCE A DAY Dx Code E11.40 100 each 3  . HYDROcodone-acetaminophen (NORCO) 10-325 MG tablet Take 1 tablet by mouth every 4 (four) hours as needed. For  Chronic pain. Max 150 tablets/month 150 tablet 0  . nitroGLYCERIN (NITROSTAT) 0.4 MG SL tablet DISSOLVE 1 TABLET UNDER TONGUE AS NEEDEDFOR CHEST PAIN. MAY REPEAT 5 MINUTES APART 3 TIMES IF NEEDED. IF NO RELIEF CALL 911 25 tablet 0  . ONETOUCH DELICA LANCETS 18A MISC 1 each by In Vitro route daily. Dx Code E11.49 100 each 3  . OXYGEN Inhale into the lungs. Per pt- Uses Oxygen 2 liters at night.    . pantoprazole (PROTONIX) 40 MG tablet TAKE 1 TABLET BY MOUTH TWICE (2) DAILY (Patient taking differently: Take 40 mg by mouth. ) 180 tablet 3  . polyethylene glycol (MIRALAX / GLYCOLAX) 17 g packet Take 51 g by mouth daily.     . tamsulosin (FLOMAX) 0.4 MG CAPS capsule Take 0.4 mg by mouth daily.     No current facility-administered medications on file prior to visit.    Allergies  Allergen Reactions  . Doxazosin Mesylate Other (See Comments)    dizziness  . Methocarbamol Rash  Past Medical History:  Diagnosis Date  . Arthritis    osteoarthritis, s/p R TKR, and digits  . CAD (coronary artery disease)    a. s/p CABG (2001)  b. s/p DES to RCA and cutting POBA to ostial PDA (2013)   c. s/p DES to SVG to OM2 (01/14/14) d. cath: 08/2015 NSTEMI w/ patent LIMA-LAD and 99% stenosis of SVG-OM w/ DES placed. CTO of SVG-RCA and SVG-D1.   . Cataract   . Chronic diastolic CHF (congestive heart failure) ([place])    a) 09/13 ECHO- LVEF 08-65%, grade 1 diastolic dysfunction, mild LA dilatation,  atrial septal aneurysm, AV mobility restricted, but no sig AS by doppler; b) 09/04/08 ECHO- LVH, ef 60%, mild AS, c. echo 08/2015: EF perserved of 55-60% with inferolateral HK. Mild AS noted.  . Chronic kidney disease, stage III (moderate)   . Chronic lower back pain   . Colon polyps   . COVID-19   . Diverticulosis   . Dyspnea 2009 since July -Sept   05/06/08-[facility]-  normal effort, reduced VO2 max 20.5 /65%, reduced at 8.2/ 40%, normal breathing resetvca of 55%, submaximal heart rate response 112/77%, flattened o2 pluse response at peak exercise-12 ml/beat @ 85%, No VQ mismatch abnormalities, All c/w CIRC Limitation  . Enlarged prostate   . Esophageal stricture    a. s/p dilation spring 2010  . GERD (gastroesophageal reflux disease)   . Heart murmur   . Hiatal hernia   . History of carpal tunnel syndrome    Bilateral  . History of kidney stones   . History of PFTs    mixed pattern on spiro. mild restn on lung volumes with near normal DLCO. Pattern can be explained by CABG scar. Fev1 2.2L/73%, ratio 68 (67), TLC 4.7/68%,RV 1.5L/55%,DLCO 79%  . Hyperlipidemia   . Hypertension   . Interstitial lung disease ([name])    NOS  . Iron deficiency anemia   . Nausea & vomiting    2018/2019  . On home oxygen therapy    2 L [place] at bedtime  . Osteoporosis   . Overweight (BMI 25.0-29.9)    BMI 29  . Peripheral neuropathy   . RA (rheumatoid arthritis) ([name])    [name]  . Seropositive rheumatoid arthritis ([place])   . Type II diabetes mellitus ([name])    diet controlled  . Wears glasses   . Wears partial dentures    upper    Past Surgical History:  Procedure Laterality Date  . BALLOON DILATION N/A 09/12/2018   Procedure: BALLOON DILATION;  Surgeon: Irene Shipper, MD;  Location: [facility];  Service: Endoscopy;  Laterality: N/A;  . BOTOX INJECTION N/A 09/12/2018   Procedure: BOTOX INJECTION;  Surgeon: Irene Shipper, MD;  Location: [facility];  Service: Endoscopy;  Laterality: N/A;  . BOTOX  INJECTION N/A 11/27/2018   Procedure: BOTOX INJECTION;  Surgeon: Irene Shipper, MD;  Location: [facility];  Service: Endoscopy;  Laterality: N/A;  . CARDIAC CATHETERIZATION  08/2004   CP- no MI, Cath- small vessell disease   . CARDIAC CATHETERIZATION  12/31/2011   80% distal LM, 100% native LAD, LCx and RCA, 30% prox SVG-OM, SVG-D1 normal, 99% distal, 80% ostial SVG-RCA distal to graft, LIMA-LAD normal; LVEF mildly decreased with posterior basal AK   . CARDIAC CATHETERIZATION  2009   with patent grafts/notes 12/31/2011  . CARDIAC CATHETERIZATION N/A 08/13/2015   Procedure: Left Heart Cath and Cors/Grafts Angiography;  Surgeon: Sherren Mocha, MD;  Location: [facility];  Service: Cardiovascular;  Laterality: N/A;  . CATARACT EXTRACTION W/ INTRAOCULAR LENS  IMPLANT, BILATERAL Bilateral   . CHOLECYSTECTOMY OPEN  11/2003   [name]  . CORONARY ANGIOPLASTY WITH STENT PLACEMENT  01/03/2012   Successful DES to SVG-RCA and cutting balloon angioplasty ostial  PDA   . CORONARY ANGIOPLASTY WITH STENT PLACEMENT  01/14/2014   "1"  . CORONARY ARTERY BYPASS GRAFT  11/1999   CABG X5  . CORONARY STENT PLACEMENT  02/2012   1 stent and balloon  . ESOPHAGEAL DILATION  11/27/2018   Procedure: ESOPHAGEAL DILATION;  Surgeon: Irene Shipper, MD;  Location: [facility];  Service: Endoscopy;;  . ESOPHAGOGASTRODUODENOSCOPY N/A 03/01/2017   Procedure: ESOPHAGOGASTRODUODENOSCOPY (EGD);  Surgeon: Irene Shipper, MD;  Location: [facility];  Service: Endoscopy;  Laterality: N/A;  . ESOPHAGOGASTRODUODENOSCOPY (EGD) WITH ESOPHAGEAL DILATION  2010  . ESOPHAGOGASTRODUODENOSCOPY (EGD) WITH PROPOFOL N/A 09/12/2018   Procedure: ESOPHAGOGASTRODUODENOSCOPY (EGD) WITH PROPOFOL;  Surgeon: Irene Shipper, MD;  Location: [facility];  Service: Endoscopy;  Laterality: N/A;  . ESOPHAGOGASTRODUODENOSCOPY (EGD) WITH PROPOFOL N/A 11/27/2018   Procedure: ESOPHAGOGASTRODUODENOSCOPY (EGD) WITH PROPOFOL, WITH BALLOON DILATION;  Surgeon:  Irene Shipper, MD;  Location: [facility];  Service: Endoscopy;  Laterality: N/A;  . HAND SURGERY     bilateral carpal tunnel releases  . JOINT REPLACEMENT    . KNEE ARTHROSCOPY Right 2008  . LEFT AND RIGHT HEART CATHETERIZATION WITH CORONARY ANGIOGRAM  12/31/2011   Procedure: LEFT AND RIGHT HEART CATHETERIZATION WITH CORONARY ANGIOGRAM;  Surgeon: Burnell Blanks, MD;  Location: [facility] CATH LAB;  Service: Cardiovascular;;  . LEFT AND RIGHT HEART CATHETERIZATION WITH CORONARY ANGIOGRAM N/A 01/14/2014   Procedure: LEFT AND RIGHT HEART CATHETERIZATION WITH CORONARY ANGIOGRAM;  Surgeon: Peter M Martinique, MD;  Location: [facility] CATH LAB;  Service: Cardiovascular;  Laterality: N/A;  . MALONEY DILATION  03/01/2017   Procedure: Venia Minks DILATION;  Surgeon: Irene Shipper, MD;  Location: [facility];  Service: Endoscopy;;  . PERCUTANEOUS CORONARY INTERVENTION-BALLOON ONLY  01/03/2012   Procedure: PERCUTANEOUS CORONARY INTERVENTION-BALLOON ONLY;  Surgeon: Peter M Martinique, MD;  Location[facility];  Service: Cardiovascular;;  . PERCUTANEOUS CORONARY STENT INTERVENTION (PCI-S)  12/31/2011   Procedure: PERCUTANEOUS CORONARY STENT INTERVENTION (PCI-S);  Surgeon: Burnell Blanks, MD;  Location: [facility] C[facility];  Service: Cardiovascular;;  . PERCUTANEOUS CORONARY STENT INTERVENTION (PCI-S) N/A 01/03/2012   Procedure: PERCUTANEOUS CORONARY STENT INTERVENTION (PCI-S);  Surgeon: Peter M Martinique, MD;  Location: [facility] CATH LAB;  Service: Cardiovascular;  Laterality: N/A;  . SHOULDER ARTHROSCOPY WITH OPEN ROTATOR CUFF REPAIR AND DISTAL CLAVICLE ACROMINECTOMY Left 02/27/2013   Procedure: LEFT SHOULDER ARTHROSCOPY WITH MINI OPEN ROTATOR CUFF REPAIR AND SUBACROMIAL DECOMPRESSION AND DISTAL CLAVICLE RESECTION;  Surgeon: [name], MD;  Location: [facility];  Service: Orthopedics;  Laterality: Left;  . TOTAL KNEE ARTHROPLASTY Right 03/2010   [name]  . TRIGGER FINGER RELEASE Left 02/27/2013   Procedure: RELEASE TRIGGER  FINGER/A-1 PULLEY;  Surgeon: [name], MD;  Location: [facility];  Service: Orthopedics;  Laterality: Left;    Family History  Problem Relation Age of Onset  . COPD Mother   . Heart disease Father   . Heart attack Father   . Stomach cancer Brother   . Diabetes Brother   . Colon cancer Brother 25  . Alcohol abuse Sister   . Stroke Sister   . Rectal cancer Neg Hx     Social History   Socioeconomic History  . Marital status:  Married    Spouse name: Not on file  . Number of children: 3  . Years of education: Not on file  . Highest education level: Not on file  Occupational History  . Occupation: Designer, jewellery: RETIRED    Comment: retired  Tobacco Use  . Smoking status: Former Smoker    Packs/day: 1.00    Years: 20.00    Pack years: 20.00    Types: Cigarettes    Quit date: 04/06/1963    Years since quitting: 56.2  . Smokeless tobacco: Never Used  Substance and Sexual Activity  . Alcohol use: No    Alcohol/week: 0.0 standard drinks    Comment: 01/01/2012 "last alcohol ~ 50 yr ago"  . Drug use: No  . Sexual activity: Not Currently  Other Topics Concern  . Not on file  Social History Narrative   No living will   Requests wife as health care POA-- alternate is daughter Hassan Rowan   Discussed DNR --he requests this (done 08/29/12)   Not sure about feeding tube---but might accept for some time   Patient lives with wife and daughter in a one story home.  Has 3 children.  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   Social Determinants of Health   Financial Resource Strain:   . Difficulty of Paying Living Expenses: Not on file  Food Insecurity:   . Worried About Charity fundraiser in the Last Year: Not on file  . Ran Out of Food in the Last Year: Not on file  Transportation Needs:   . Lack of Transportation (Medical): Not on file  . Lack of Transportation (Non-Medical): Not on file  Physical Activity:   . Days of Exercise per Week: Not on file  .  Minutes of Exercise per Session: Not on file  Stress:   . Feeling of Stress : Not on file  Social Connections:   . Frequency of Communication with Friends and Family: Not on file  . Frequency of Social Gatherings with Friends and Family: Not on file  . Attends Religious Services: Not on file  . Active Member of Clubs or Organizations: Not on file  . Attends Archivist Meetings: Not on file  . Marital Status: Not on file  Intimate Partner Violence:   . Fear of Current or Ex-Partner: Not on file  . Emotionally Abused: Not on file  . Physically Abused: Not on file  . Sexually Abused: Not on file    Review of Systems Right groin hernia is stable--no pain Eating fine Weight stable Does feel he is finally over the COVID infection Sleeps okay Wears seat belt Not on Rx for RA at present Bowels are slow---miralax helps No heartburn. Does have occasional dysphagia (has had dilation and botox by Dr Henrene Pastor) Voids okay on flomax    Objective:   Physical Exam  Constitutional: He is oriented to person, place, and time. He appears well-developed. No distress.  HENT:  Mouth/Throat: Oropharynx is clear and moist. No oropharyngeal exudate.  Neck: No thyromegaly present.  Cardiovascular: Normal rate, regular rhythm and intact distal pulses. Exam reveals no gallop.  Soft systolic murmur at base  Respiratory: Effort normal. No respiratory distress. He has no wheezes.  Decreased breath sounds Coarse bibasilar crackles  GI: Soft. There is no abdominal tenderness.  Musculoskeletal:        General: No tenderness or edema.  Lymphadenopathy:    He has no cervical adenopathy.  Neurological:  He is alert and oriented to person, place, and time.  President--- "Zoila Shutter, Obama" [phone] [id number] Recall 3/3  Skin:  No foot lesions  Psychiatric: He has a normal mood and affect. His behavior is normal.           Assessment & Plan:

## 2019-06-13 NOTE — Assessment & Plan Note (Signed)
Has DNR 

## 2019-06-13 NOTE — Progress Notes (Signed)
Hearing Screening   125Hz  250Hz  500Hz  1000Hz  2000Hz  3000Hz  4000Hz  6000Hz  8000Hz   Right ear:           Left ear:           Vision Screening Comments: 02/2019 [facility]

## 2019-06-13 NOTE — Assessment & Plan Note (Signed)
Used oxygen every night

## 2019-06-14 ENCOUNTER — Ambulatory Visit
Payer: Medicare Other | Attending: Student in an Organized Health Care Education/Training Program | Admitting: Student in an Organized Health Care Education/Training Program

## 2019-06-14 ENCOUNTER — Encounter: Payer: Self-pay | Admitting: Student in an Organized Health Care Education/Training Program

## 2019-06-14 DIAGNOSIS — E1122 Type 2 diabetes mellitus with diabetic chronic kidney disease: Secondary | ICD-10-CM

## 2019-06-14 DIAGNOSIS — R809 Proteinuria, unspecified: Secondary | ICD-10-CM | POA: Diagnosis not present

## 2019-06-14 DIAGNOSIS — M792 Neuralgia and neuritis, unspecified: Secondary | ICD-10-CM | POA: Diagnosis not present

## 2019-06-14 DIAGNOSIS — G894 Chronic pain syndrome: Secondary | ICD-10-CM

## 2019-06-14 DIAGNOSIS — M79604 Pain in right leg: Secondary | ICD-10-CM

## 2019-06-14 DIAGNOSIS — M79605 Pain in left leg: Secondary | ICD-10-CM

## 2019-06-14 DIAGNOSIS — M059 Rheumatoid arthritis with rheumatoid factor, unspecified: Secondary | ICD-10-CM

## 2019-06-14 DIAGNOSIS — G629 Polyneuropathy, unspecified: Secondary | ICD-10-CM

## 2019-06-14 DIAGNOSIS — E1142 Type 2 diabetes mellitus with diabetic polyneuropathy: Secondary | ICD-10-CM | POA: Diagnosis not present

## 2019-06-14 DIAGNOSIS — Z79891 Long term (current) use of opiate analgesic: Secondary | ICD-10-CM | POA: Diagnosis not present

## 2019-06-14 DIAGNOSIS — G8929 Other chronic pain: Secondary | ICD-10-CM

## 2019-06-14 DIAGNOSIS — N184 Chronic kidney disease, stage 4 (severe): Secondary | ICD-10-CM | POA: Diagnosis not present

## 2019-06-14 DIAGNOSIS — I129 Hypertensive chronic kidney disease with stage 1 through stage 4 chronic kidney disease, or unspecified chronic kidney disease: Secondary | ICD-10-CM | POA: Diagnosis not present

## 2019-06-14 DIAGNOSIS — N183 Chronic kidney disease, stage 3 unspecified: Secondary | ICD-10-CM

## 2019-06-14 MED ORDER — HYDROCODONE-ACETAMINOPHEN 10-325 MG PO TABS: 1.0000 | ORAL_TABLET | ORAL | 0 refills | Status: AC | PRN

## 2019-06-14 NOTE — Progress Notes (Signed)
Patient: [name]  Service Category: E/M  Provider: Gillis Santa, MD  DOB: 06-Jan-1932  DOS: 06/14/2019  Location: Office  MRN: [id number]  Setting: Ambulatory outpatient  Referring Provider: [name], MD  Type: Established Patient  Specialty: Interventional Pain Management  PCP: [name], MD  Location: Home  Delivery: TeleHealth     Virtual Encounter - Pain Management PROVIDER NOTE: Information contained herein reflects review and annotations entered in association with encounter. Interpretation of such information and data should be left to medically-trained personnel. Information provided to patient can be located elsewhere in the medical record under "Patient Instructions". Document created using STT-dictation technology, any transcriptional errors that may result from process are unintentional.    Contact & Pharmacy Preferred: [phone] Home: [phone] (home) Mobile: [phone] (mobile) E-mail: No e-mail address on record  [place], [name], [place] [address], [place] [address] Phone: [phone] Fax: [phone]  [place], [place] [address] [place] [address] Phone: [phone] Fax: [phone]   Pre-screening  [name] offered "in-person" vs "virtual" encounter. He indicated preferring virtual for this encounter.   Reason COVID-19*  Social distancing based on CDC and AMA recommendations.   I contacted DOY TAAFFE on 06/14/2019 via telephone.      I clearly identified myself as Gillis Santa, MD. I verified that I was speaking with the correct person using two identifiers (Name: [name], and date of birth: Feb 18, 1932).  This visit was completed via telephone due to the restrictions of the COVID-19 pandemic. All issues as above were discussed and addressed but no physical exam was performed. If it was felt that the patient  should be evaluated in the office, they were directed there. The patient verbally consented to this visit. Patient was unable to complete an audio/visual visit due to Technical difficulties and/or Lack of internet. Due to the catastrophic nature of the COVID-19 pandemic, this visit was done through audio contact only.  Location of the patient: home address (see Epic for details)  Location of the provider: office  Consent I sought verbal advanced consent from [name] for virtual visit interactions. I informed Mr. Pustejovsky of possible security and privacy concerns, risks, and limitations associated with providing "not-in-person" medical evaluation and management services. I also informed Mr. Jipson of the availability of "in-person" appointments. Finally, I informed him that there would be a charge for the virtual visit and that he could be  personally, fully or partially, financially responsible for it. Mr. Amon expressed understanding and agreed to proceed.   Historic Elements   [name] is a 86 y.o. year old, male patient evaluated today after his last contact with our practice on 06/12/2019. [name]  has a past medical history of Arthritis, CAD (coronary artery disease), Cataract, Chronic diastolic CHF (congestive heart failure) ([place]), Chronic kidney disease, stage III (moderate), Chronic lower back pain, Colon polyps, COVID-19, Diverticulosis, Dyspnea (2009 since July -Sept), Enlarged prostate, Esophageal stricture, GERD (gastroesophageal reflux disease), Heart murmur, Hiatal hernia, History of carpal tunnel syndrome, History of kidney stones, History of PFTs, Hyperlipidemia, Hypertension, Interstitial lung disease ([place]), Iron deficiency anemia, Nausea & vomiting, On home oxygen therapy, Osteoporosis, Overweight (BMI 25.0-29.9), Peripheral neuropathy, RA (rheumatoid arthritis) ([place]), Seropositive rheumatoid arthritis ([place]), Type II diabetes mellitus ([place]), Wears glasses, and  Wears partial dentures. He also  has a past surgical history that includes Total  knee arthroplasty (Right, 03/2010); Knee arthroscopy (Right, 2008); Cataract extraction w/ intraocular lens  implant, bilateral (Bilateral); Coronary artery bypass graft (11/1999); Coronary stent placement (02/2012); Shoulder arthroscopy with open rotator cuff repair and distal clavicle acrominectomy (Left, 02/27/2013); Trigger finger release (Left, 02/27/2013); Cholecystectomy open (11/2003); Joint replacement; Esophagogastroduodenoscopy (egd) with esophageal dilation (2010); Cardiac catheterization (08/2004); Cardiac catheterization (12/31/2011); Cardiac catheterization (2009); left and right heart catheterization with coronary angiogram (12/31/2011); percutaneous coronary stent intervention (pci-s) (12/31/2011); percutaneous coronary stent intervention (pci-s) (N/A, 01/03/2012); Percutaneous coronary intervention-balloon only (01/03/2012); left and right heart catheterization with coronary angiogram (N/A, 01/14/2014); Hand surgery; Cardiac catheterization (N/A, 08/13/2015); Esophagogastroduodenoscopy (N/A, 03/01/2017); maloney dilation (03/01/2017); Esophagogastroduodenoscopy (egd) with propofol (N/A, 09/12/2018); Botox injection (N/A, 09/12/2018); Balloon dilation (N/A, 09/12/2018); Coronary angioplasty with stent (01/03/2012); Coronary angioplasty with stent (01/14/2014); Esophagogastroduodenoscopy (egd) with propofol (N/A, 11/27/2018); Botox injection (N/A, 11/27/2018); and Esophageal dilation (11/27/2018). [name] has a current medication list which includes the following prescription(s): aspirin ec, one touch ultra 2, carvedilol, clopidogrel, dha-vitamin c-lutein, furosemide, glucose blood, [START ON 06/15/2019] hydrocodone-acetaminophen, [START ON 07/15/2019] hydrocodone-acetaminophen, [START ON 08/14/2019] hydrocodone-acetaminophen, nitroglycerin, onetouch delica lancets 57X, oxygen-helium, pantoprazole, polyethylene glycol, and tamsulosin. He   reports that he quit smoking about 56 years ago. His smoking use included cigarettes. He has a 20.00 pack-year smoking history. He has never used smokeless tobacco. He reports that he does not drink alcohol or use drugs. Mr. Vandrunen is allergic to doxazosin mesylate and methocarbamol.   HPI  Today, he is being contacted for medication management.   No change in medical history since last visit.  Patient's pain is at baseline.  Patient continues multimodal pain regimen as prescribed.  States that it provides pain relief and improvement in functional status.  Pharmacotherapy Assessment  Analgesic:  05/18/2019  1   03/19/2019  Hydrocodone-Acetamin 10-325 MG  150.00  25 Bi Lat   [id number]   Gib (4800)   0  60.00 MME  Medicare   [place]   Monitoring: [place] PMP: PDMP reviewed during this encounter.       Pharmacotherapy: No side-effects or adverse reactions reported. Compliance: No problems identified. Effectiveness: Clinically acceptable. Plan: Refer to "POC".  UDS:  Summary  Date Value Ref Range Status  12/19/2018 Note  Final    Comment:    ==================================================================== [employer] Select 13 (MW) ==================================================================== Test                             Result       Flag       Units Drug Present and Declared for Prescription Verification   Hydrocodone                    3037         EXPECTED   ng/mg creat   Hydromorphone                  2885         EXPECTED   ng/mg creat   Dihydrocodeine                 545          EXPECTED   ng/mg creat   Norhydrocodone                 2240         EXPECTED   ng/mg creat    Sources of hydrocodone include scheduled prescription medications.  Hydromorphone, dihydrocodeine and norhydrocodone are expected    metabolites of hydrocodone. Hydromorphone and dihydrocodeine are    also available as scheduled prescription  medications. ==================================================================== Test                      Result    Flag   Units      Ref Range   Creatinine              73               mg/dL      >=20 ==================================================================== Declared Medications:  The flagging and interpretation on this report are based on the  following declared medications.  Unexpected results may arise from  inaccuracies in the declared medications.  **Note: The testing scope of this panel includes these medications:  Hydrocodone  **Note: The testing scope of this panel does not include the  following reported medications:  Acetaminophen  Aspirin  Carvedilol  Cholecalciferol  Clopidogrel (Plavix)  Furosemide  Lutein  Nitroglycerin (Nitrostat)  Omega-3 Fatty Acids  Oxygen  Pantoprazole (Protonix)  Polyethylene Glycol (MiraLAX)  Vitamin C ==================================================================== For clinical consultation, please call [phone]. ====================================================================    Laboratory Chemistry Profile   Renal Lab Results  Component Value Date   BUN 33 (H) 06/13/2019   CREATININE 2.06 (H) 06/13/2019   BCR 13 06/24/2017   GFR 30.78 (L) 06/13/2019   GFRAA 30 (L) 06/24/2017   GFRNONAA 26 (L) 06/24/2017    Hepatic Lab Results  Component Value Date   AST 14 06/13/2019   ALT 9 06/13/2019   ALBUMIN 4.1 06/13/2019   ALBUMIN 4.1 06/13/2019   ALKPHOS 58 06/13/2019    Electrolytes Lab Results  Component Value Date   NA 133 (L) 06/13/2019   K 4.6 06/13/2019   CL 96 06/13/2019   CALCIUM 9.9 06/13/2019   MG 1.9 04/08/2017   PHOS 2.9 06/13/2019    Bone No results found for: VD25OH, VD125OH2TOT, VU0233ID5, WY6168HF2, 25OHVITD1, 25OHVITD2, 25OHVITD3, TESTOFREE, TESTOSTERONE  Inflammation (CRP: Acute Phase) (ESR: Chronic Phase) Lab Results  Component Value Date   ESRSEDRATE 38 (H) 11/22/2011       Note: Above Lab results reviewed.  Imaging  DG Chest Portable 1 View CLINICAL DATA:  Shortness of breath and fevers, COVID-19 positivity  EXAM: PORTABLE CHEST 1 VIEW  COMPARISON:  02/06/2019  FINDINGS: Cardiac shadows within normal limits. Postsurgical changes are seen. The lungs are well aerated bilaterally without focal infiltrate or sizable effusion. No acute bony abnormality is seen.  IMPRESSION: No active disease.  Electronically Signed   By: Inez Catalina M.D.   On: 04/03/2019 13:06  Assessment  The primary encounter diagnosis was Neuropathic pain. Diagnoses of Chronic pain of both lower extremities, Long term prescription opiate use, Diabetic polyneuropathy associated with type 2 diabetes mellitus ([place]), Seropositive rheumatoid arthritis ([place]), CKD stage 3 due to type 2 diabetes mellitus ([place]), Neuropathy, and Chronic pain syndrome were also pertinent to this visit.  Plan of Care  Mr. MORRIS LONGENECKER has a current medication list which includes the following long-term medication(s): carvedilol, furosemide, nitroglycerin, and pantoprazole.  Pharmacotherapy (Medications Ordered): Meds ordered this encounter  Medications  . HYDROcodone-acetaminophen ([place]) 10-325 MG tablet    Sig: Take 1 tablet by mouth every 4 (four) hours as needed. For  Chronic pain. Max 150 tablets/month    Dispense:  150 tablet    Refill:  0  . HYDROcodone-acetaminophen (NORCO) 10-325 MG tablet  Sig: Take 1 tablet by mouth every 4 (four) hours as needed. For  Chronic pain. Max 150 tablets/month    Dispense:  150 tablet    Refill:  0  . HYDROcodone-acetaminophen (NORCO) 10-325 MG tablet    Sig: Take 1 tablet by mouth every 4 (four) hours as needed. For  Chronic pain. Max 150 tablets/month    Dispense:  150 tablet    Refill:  0   Follow-up plan:   Return in about 3 months (around 09/14/2019) for Medication Management, in person.    Recent Visits Date Type Provider Dept  06/12/19 Office  Visit Gillis Santa, MD [facility]  03/19/19 Office Visit Gillis Santa, MD [facility]  Showing recent visits within past 90 days and meeting all other requirements   Today's Visits Date Type Provider Dept  06/14/19 Office Visit Gillis Santa, MD [facility]  Showing today's visits and meeting all other requirements   Future Appointments No visits were found meeting these conditions.  Showing future appointments within next 90 days and meeting all other requirements   I discussed the assessment and treatment plan with the patient. The patient was provided an opportunity to ask questions and all were answered. The patient agreed with the plan and demonstrated an understanding of the instructions.  Patient advised to call back or seek an in-person evaluation if the symptoms or condition worsens.  Duration of encounter: 25 minutes.  Note by: Gillis Santa, MD Date: 06/14/2019; Time: 12:04 PM

## 2019-06-16 DIAGNOSIS — R0689 Other abnormalities of breathing: Secondary | ICD-10-CM | POA: Diagnosis not present

## 2019-06-16 DIAGNOSIS — J8410 Pulmonary fibrosis, unspecified: Secondary | ICD-10-CM | POA: Diagnosis not present

## 2019-06-22 ENCOUNTER — Telehealth: Payer: Self-pay | Admitting: Internal Medicine

## 2019-06-22 NOTE — Telephone Encounter (Signed)
Patient dropped off form for diabetic shoes from [employer].  Please call patient when form is completed. Form's in rx tower.

## 2019-06-25 NOTE — Telephone Encounter (Signed)
Form filled out and last OV notes printed. Form placed in Dr Alla German inbox on his desk.

## 2019-06-25 NOTE — Telephone Encounter (Signed)
I left a message on patient's voice mail to let him know form is ready for pick up.  I let him know if he wants me to fax form to company, to call me back.

## 2019-06-25 NOTE — Telephone Encounter (Signed)
Form signed No charge 

## 2019-07-06 DIAGNOSIS — E119 Type 2 diabetes mellitus without complications: Secondary | ICD-10-CM | POA: Diagnosis not present

## 2019-07-25 ENCOUNTER — Telehealth: Payer: Self-pay | Admitting: Internal Medicine

## 2019-07-25 NOTE — Chronic Care Management (AMB) (Signed)
  Chronic Care Management   Note  07/25/2019 Name: [name] MRN: [id number] DOB: 1931-10-07  [name] is a 86 y.o. year old male who is a primary care patient of [name], Theophilus Kinds, MD. I reached out to [name] by phone today in response to a referral sent by [name] PCP, [name], MD.   [name] was given information about Chronic Care Management services today including:  1. CCM service includes personalized support from designated clinical staff supervised by his physician, including individualized plan of care and coordination with other care providers 2. 24/7 contact phone numbers for assistance for urgent and routine care needs. 3. Service will only be billed when office clinical staff spend 20 minutes or more in a month to coordinate care. 4. Only one practitioner may furnish and bill the service in a calendar month. 5. The patient may stop CCM services at any time (effective at the end of the month) by phone call to the office staff.   Patient agreed to services and verbal consent obtained.   Follow up plan:   Raynicia Dukes [employer] Scheduler

## 2019-07-30 ENCOUNTER — Ambulatory Visit: Payer: Medicare Other | Attending: Internal Medicine

## 2019-07-30 ENCOUNTER — Ambulatory Visit: Payer: Medicare Other | Admitting: Orthopedic Surgery

## 2019-07-30 DIAGNOSIS — Z20822 Contact with and (suspected) exposure to covid-19: Secondary | ICD-10-CM

## 2019-07-31 LAB — NOVEL CORONAVIRUS, NAA: SARS-CoV-2, NAA: NOT DETECTED

## 2019-07-31 LAB — SARS-COV-2, NAA 2 DAY TAT

## 2019-08-01 ENCOUNTER — Ambulatory Visit: Payer: Medicare Other | Admitting: Physical Medicine and Rehabilitation

## 2019-08-06 ENCOUNTER — Other Ambulatory Visit: Payer: Self-pay | Admitting: Internal Medicine

## 2019-08-09 ENCOUNTER — Other Ambulatory Visit: Payer: Self-pay

## 2019-08-09 ENCOUNTER — Ambulatory Visit (INDEPENDENT_AMBULATORY_CARE_PROVIDER_SITE_OTHER): Payer: Medicare Other | Admitting: Orthopedic Surgery

## 2019-08-09 DIAGNOSIS — M6702 Short Achilles tendon (acquired), left ankle: Secondary | ICD-10-CM

## 2019-08-10 ENCOUNTER — Encounter: Payer: Self-pay | Admitting: Orthopedic Surgery

## 2019-08-10 NOTE — Progress Notes (Signed)
Office Visit Note   Patient: [name]           Date of Birth: Aug 30, 1931           MRN: [id number] Visit Date: 08/09/2019              Requested by: [name], MD [place],  [place] [address] PCP: [name], MD  Chief Complaint  Patient presents with  . Left Foot - Pain      HPI: Patient is an 86 year old gentleman who presents with lateral left foot pain patient states he has had a long history of neuropathy currently has been using Vicodin that he has received from his pain clinic.  Patient complains of callus laterally over the foot.  Assessment & Plan: Visit Diagnoses: No diagnosis found.  Plan: Patient was given instructions and demonstrated Achilles stretching to do 5 times a day.  Recommended a stiff soled supportive shoe.  Follow-Up Instructions: Return if symptoms worsen or fail to improve.   Ortho Exam  Patient is alert, oriented, no adenopathy, well-dressed, normal affect, normal respiratory effort. Examination patient has a good dorsalis pedis pulse he has callus over the fifth metatarsal head.  After informed consent the callus was pared without complications no signs of infection no open wound.  With the knee extended patient has dorsiflexion only to neutral with Achilles contracture.  The Achilles tendon is nontender to palpation.  Imaging: No results found. No images are attached to the encounter.  Labs: Lab Results  Component Value Date   HGBA1C 8.0 (H) 06/13/2019   HGBA1C 8.9 (A) 10/27/2018   HGBA1C 8.1 (H) 04/28/2018   ESRSEDRATE 38 (H) 11/22/2011   ESRSEDRATE 17 (H) 03/19/2008   ESRSEDRATE 11 05/17/2006     Lab Results  Component Value Date   ALBUMIN 4.1 06/13/2019   ALBUMIN 4.1 06/13/2019   ALBUMIN 4.1 04/28/2018   ALBUMIN 4.1 04/28/2018    Lab Results  Component Value Date   MG 1.9 04/08/2017   No results found for: VD25OH  No results found for: PREALBUMIN CBC EXTENDED Latest Ref Rng &  Units 06/13/2019 04/28/2018 06/24/2017  WBC 4.0 - 10.5 K/uL 7.0 8.3 7.8  RBC 4.22 - 5.81 Mil/uL 4.78 5.11 4.82  HGB 13.0 - 17.0 g/dL 14.1 15.1 12.0(L)  HCT 39.0 - 52.0 % 41.4 44.4 38.2(L)  PLT 150.0 - 400.0 K/uL 199.0 189.0 201  NEUTROABS 1,500 - 7,800 cells/uL - - 4,727  LYMPHSABS 850 - 3,900 cells/uL - - 1,693     There is no height or weight on file to calculate BMI.  Orders:  No orders of the defined types were placed in this encounter.  No orders of the defined types were placed in this encounter.    Procedures: No procedures performed  Clinical Data: No additional findings.  ROS:  All other systems negative, except as noted in the HPI. Review of Systems  Objective: Vital Signs: There were no vitals taken for this visit.  Specialty Comments:  No specialty comments available.  PMFS History: Patient Active Problem List   Diagnosis Date Noted  . COVID-19 virus infection 04/12/2019  . Long term prescription opiate use 12/14/2018  . Neuropathy 12/14/2018  . CKD stage 3 due to type 2 diabetes mellitus ([place]) 12/14/2018  . Problems with swallowing and mastication   . Achalasia   . Right inguinal hernia 07/04/2018  . Advance directive discussed with patient 04/28/2018  . Chronic pain of  both lower extremities 10/26/2017  . Neuropathic pain 10/26/2017  . Chronic pain syndrome 10/26/2017  . Iron deficiency anemia 07/05/2017  . Constipation 07/05/2017  . CKD (chronic kidney disease) stage 4, GFR 15-29 ml/min ([name]) 05/17/2017  . Seropositive rheumatoid arthritis ([place]) 05/17/2017  . DM (diabetes mellitus) type II controlled, neurological manifestation ([place]) 05/17/2017  . Atherosclerotic heart disease of native coronary artery with angina pectoris ([place]) 05/17/2017  . Dysphagia 05/17/2017  . High risk medication use 10/21/2016  . Primary osteoarthritis of both knees 10/21/2016  . History of right knee joint replacement 10/21/2016  . Orthostatic hypotension 10/01/2016  .  Tegretol-induced dizziness 05/14/2016  . Spinal stenosis of lumbar region without neurogenic claudication 03/23/2016  . Spondylosis without myelopathy or radiculopathy, lumbar region 03/23/2016  . Respiratory failure, chronic ([place]) 11/21/2014  . Rheumatoid arthritis ([place]) 11/05/2014  . Rheumatic fever without heart involvement 03/04/2014  . Ventral hernia 12/17/2013  . Routine general medical examination at a health care facility 08/29/2012  . Routine history and physical examination of adult 08/29/2012  . ILD (interstitial lung disease) ([place]) 11/28/2011  . Chronic diastolic heart failure ([place]) 09/14/2011  . Diabetic polyneuropathy associated with type 2 diabetes mellitus ([place]) 08/10/2010  . Obstructive sleep apnea 12/17/2008  . ESOPHAGEAL STRICTURE 10/09/2008  . Esophageal stricture 10/09/2008  . Reflux esophagitis 09/10/2008  . Diverticulosis of colon 09/10/2008  . Gastro-esophageal reflux disease with esophagitis 09/10/2008  . Coronary atherosclerosis 03/19/2008  . Aortic valve disorder 03/19/2008  . Thoracic aorta atherosclerosis ([place]) 03/19/2008  . Actinic keratosis 10/23/2007  . SLEEP DISORDER, CHRONIC 10/17/2006  . Disturbance in sleep behavior 10/17/2006  . 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 Essential hypertension 09/23/2006  . GERD 09/23/2006  . BENIGN PROSTATIC HYPERTROPHY 09/23/2006  . Enlarged prostate without lower urinary tract symptoms (luts) 09/23/2006  . HLD (hyperlipidemia) 09/23/2006   Past Medical History:  Diagnosis Date  . Arthritis    osteoarthritis, s/p R TKR, and digits  . CAD (coronary artery disease)    a. s/p CABG (2001)  b. s/p DES to RCA and cutting POBA to ostial PDA (2013)   c. s/p DES to SVG to OM2 (01/14/14) d. cath: 08/2015 NSTEMI w/ patent LIMA-LAD and 99% stenosis of SVG-OM w/ DES placed. CTO of SVG-RCA and SVG-D1.   . Cataract   . Chronic diastolic CHF (congestive heart failure) ([place])    a) 09/13 ECHO- LVEF  95-18%, grade 1 diastolic dysfunction, mild LA dilatation, atrial septal aneurysm, AV mobility restricted, but no sig AS by doppler; b) 09/04/08 ECHO- LVH, ef 60%, mild AS, c. echo 08/2015: EF perserved of 55-60% with inferolateral HK. Mild AS noted.  . Chronic kidney disease, stage III (moderate)   . Chronic lower back pain   . Colon polyps   . COVID-19   . Diverticulosis   . Dyspnea 2009 since July -Sept   05/06/08-CP[facility]-  normal effort, reduced VO2 max 20.5 /65%, reduced at 8.2/ 40%, normal breathing resetvca of 55%, submaximal heart rate response 112/77%, flattened o2 pluse response at peak exercise-12 ml/beat @ 85%, No VQ mismatch abnormalities, All c/w CIRC Limitation  . Enlarged prostate   . Esophageal stricture    a. s/p dilation spring 2010  . GERD (gastroesophageal reflux disease)   . Heart murmur   . Hiatal hernia   . History of carpal tunnel syndrome    Bilateral  . History of kidney stones   . History of PFTs    mixed pattern on spiro. mild restn  on lung volumes with near normal DLCO. Pattern can be explained by CABG scar. Fev1 2.2L/73%, ratio 68 (67), TLC 4.7/68%,RV 1.5L/55%,DLCO 79%  . Hyperlipidemia   . Hypertension   . Interstitial lung disease ([name])    NOS  . Iron deficiency anemia   . Nausea & vomiting    2018/2019  . On home oxygen therapy    2 L [place] at bedtime  . Osteoporosis   . Overweight (BMI 25.0-29.9)    BMI 29  . Peripheral neuropathy   . RA (rheumatoid arthritis) ([name])    [name]  . Seropositive rheumatoid arthritis ([place])   . Type II diabetes mellitus ([name])    diet controlled  . Wears glasses   . Wears partial dentures    upper    Family History  Problem Relation Age of Onset  . COPD Mother   . Heart disease Father   . Heart attack Father   . Stomach cancer Brother   . Diabetes Brother   . Colon cancer Brother 25  . Alcohol abuse Sister   . Stroke Sister   . Rectal cancer Neg Hx     Past Surgical History:  Procedure Laterality Date  .  BALLOON DILATION N/A 09/12/2018   Procedure: BALLOON DILATION;  Surgeon: Irene Shipper, MD;  Location: [facility];  Service: Endoscopy;  Laterality: N/A;  . BOTOX INJECTION N/A 09/12/2018   Procedure: BOTOX INJECTION;  Surgeon: Irene Shipper, MD;  Location: [facility];  Service: Endoscopy;  Laterality: N/A;  . BOTOX INJECTION N/A 11/27/2018   Procedure: BOTOX INJECTION;  Surgeon: Irene Shipper, MD;  Location: [facility];  Service: Endoscopy;  Laterality: N/A;  . CARDIAC CATHETERIZATION  08/2004   CP- no MI, Cath- small vessell disease   . CARDIAC CATHETERIZATION  12/31/2011   80% distal LM, 100% native LAD, LCx and RCA, 30% prox SVG-OM, SVG-D1 normal, 99% distal, 80% ostial SVG-RCA distal to graft, LIMA-LAD normal; LVEF mildly decreased with posterior basal AK   . CARDIAC CATHETERIZATION  2009   with patent grafts/notes 12/31/2011  . CARDIAC CATHETERIZATION N/A 08/13/2015   Procedure: Left Heart Cath and Cors/Grafts Angiography;  Surgeon: Sherren Mocha, MD;  Location: [facility];  Service: Cardiovascular;  Laterality: N/A;  . CATARACT EXTRACTION W/ INTRAOCULAR LENS  IMPLANT, BILATERAL Bilateral   . CHOLECYSTECTOMY OPEN  11/2003   [name]  . CORONARY ANGIOPLASTY WITH STENT PLACEMENT  01/03/2012   Successful DES to SVG-RCA and cutting balloon angioplasty ostial  PDA   . CORONARY ANGIOPLASTY WITH STENT PLACEMENT  01/14/2014   "1"  . CORONARY ARTERY BYPASS GRAFT  11/1999   CABG X5  . CORONARY STENT PLACEMENT  02/2012   1 stent and balloon  . ESOPHAGEAL DILATION  11/27/2018   Procedure: ESOPHAGEAL DILATION;  Surgeon: Irene Shipper, MD;  Location: [facility];  Service: Endoscopy;;  . ESOPHAGOGASTRODUODENOSCOPY N/A 03/01/2017   Procedure: ESOPHAGOGASTRODUODENOSCOPY (EGD);  Surgeon: Irene Shipper, MD;  Location: [facility];  Service: Endoscopy;  Laterality: N/A;  . ESOPHAGOGASTRODUODENOSCOPY (EGD) WITH ESOPHAGEAL DILATION  2010  . ESOPHAGOGASTRODUODENOSCOPY (EGD) WITH PROPOFOL N/A  09/12/2018   Procedure: ESOPHAGOGASTRODUODENOSCOPY (EGD) WITH PROPOFOL;  Surgeon: Irene Shipper, MD;  Location: [facility];  Service: Endoscopy;  Laterality: N/A;  . ESOPHAGOGASTRODUODENOSCOPY (EGD) WITH PROPOFOL N/A 11/27/2018   Procedure: ESOPHAGOGASTRODUODENOSCOPY (EGD) WITH PROPOFOL, WITH BALLOON DILATION;  Surgeon: Irene Shipper, MD;  Location: [facility];  Service: Endoscopy;  Laterality: N/A;  . HAND SURGERY  bilateral carpal tunnel releases  . JOINT REPLACEMENT    . KNEE ARTHROSCOPY Right 2008  . LEFT AND RIGHT HEART CATHETERIZATION WITH CORONARY ANGIOGRAM  12/31/2011   Procedure: LEFT AND RIGHT HEART CATHETERIZATION WITH CORONARY ANGIOGRAM;  Surgeon: Burnell Blanks, MD;  Location: [facility] C[facility] LAB;  Service: Cardiovascular;;  . LEFT AND RIGHT HEART CATHETERIZATION WITH CORONARY ANGIOGRAM N/A 01/14/2014   Procedure: LEFT AND RIGHT HEART CATHETERIZATION WITH CORONARY ANGIOGRAM;  Surgeon: Peter M Martinique, MD;  Location: [facility] CATH LAB;  Service: Cardiovascular;  Laterality: N/A;  . MALONEY DILATION  03/01/2017   Procedure: Venia Minks DILATION;  Surgeon: Irene Shipper, MD;  Location: [facility];  Service: Endoscopy;;  . PERCUTANEOUS CORONARY INTERVENTION-BALLOON ONLY  01/03/2012   Procedure: PERCUTANEOUS CORONARY INTERVENTION-BALLOON ONLY;  Surgeon: Peter M Martinique, MD;  Location: [facility] CATH LAB;  Service: Cardiovascular;;  . PERCUTANEOUS CORONARY STENT INTERVENTION (PCI-S)  12/31/2011   Procedure: PERCUTANEOUS CORONARY STENT INTERVENTION (PCI-S);  Surgeon: Burnell Blanks, MD;  Location: [facility] CATH LAB;  Service: Cardiovascular;;  . PERCUTANEOUS CORONARY STENT INTERVENTION (PCI-S) N/A 01/03/2012   Procedure: PERCUTANEOUS CORONARY STENT INTERVENTION (PCI-S);  Surgeon: Peter M Martinique, MD;  Location: [facility] CATH LAB;  Service: Cardiovascular;  Laterality: N/A;  . SHOULDER ARTHROSCOPY WITH OPEN ROTATOR CUFF REPAIR AND DISTAL CLAVICLE ACROMINECTOMY Left 02/27/2013   Procedure: LEFT SHOULDER  ARTHROSCOPY WITH MINI OPEN ROTATOR CUFF REPAIR AND SUBACROMIAL DECOMPRESSION AND DISTAL CLAVICLE RESECTION;  Surgeon: [name], MD;  Location: [facility];  Service: Orthopedics;  Laterality: Left;  . TOTAL KNEE ARTHROPLASTY Right 03/2010   [name]  . TRIGGER FINGER RELEASE Left 02/27/2013   Procedure: RELEASE TRIGGER FINGER/A-1 PULLEY;  Surgeon: [name], MD;  Location: [facility];  Service: Orthopedics;  Laterality: Left;   Social History   Occupational History  . Occupation: Designer, jewellery: RETIRED    Comment: retired  Tobacco Use  . Smoking status: Former Smoker    Packs/day: 1.00    Years: 20.00    Pack years: 20.00    Types: Cigarettes    Quit date: 04/06/1963    Years since quitting: 56.3  . Smokeless tobacco: Never Used  Substance and Sexual Activity  . Alcohol use: No    Alcohol/week: 0.0 standard drinks    Comment: 01/01/2012 "last alcohol ~ 50 yr ago"  . Drug use: No  . Sexual activity: Not Currently

## 2019-08-17 ENCOUNTER — Telehealth: Payer: Self-pay

## 2019-08-17 DIAGNOSIS — I10 Essential (primary) hypertension: Secondary | ICD-10-CM

## 2019-08-17 DIAGNOSIS — I5032 Chronic diastolic (congestive) heart failure: Secondary | ICD-10-CM

## 2019-08-17 NOTE — Telephone Encounter (Signed)
Referral created.

## 2019-08-17 NOTE — Telephone Encounter (Signed)
Per written referral from PCP, requesting referral in Epic for [name] to chronic care management pharmacy services for the following conditions:   Essential hypertension, benign  [H68]  Chronic diastolic heart failure [G88.11]  [name], PharmD Clinical Pharmacist [facility] [phone]

## 2019-08-20 ENCOUNTER — Other Ambulatory Visit: Payer: Self-pay

## 2019-08-20 ENCOUNTER — Ambulatory Visit: Payer: Medicare Other

## 2019-08-20 ENCOUNTER — Telehealth: Payer: Self-pay

## 2019-08-20 DIAGNOSIS — I10 Essential (primary) hypertension: Secondary | ICD-10-CM

## 2019-08-20 DIAGNOSIS — I25119 Atherosclerotic heart disease of native coronary artery with unspecified angina pectoris: Secondary | ICD-10-CM

## 2019-08-20 DIAGNOSIS — E1140 Type 2 diabetes mellitus with diabetic neuropathy, unspecified: Secondary | ICD-10-CM

## 2019-08-20 NOTE — Chronic Care Management (AMB) (Signed)
Chronic Care Management Pharmacy  Name: [name]  MRN: [id number] DOB: Aug 14, 1931  Chief Complaint/ HPI  [name],  86 y.o., male presents for their Initial CCM visit with the clinical pharmacist via telephone.  PCP : [name] Carbon, MD  Their chronic conditions include: hypertension, hyperlipidemia, coronary atherosclerosis, chronic diastolic heart failure, sleep apnea, ILD, chronic resp. failure, GERD, type 2 DM, CKD, polyneuropathy, rheumatoid arthritis, BPH, chronic pain, chronic sleep disorder  Patient concerns:  denies side effects, denies any medication changes in the past 3 months; would like to refill Zofran or alternative for occasional nausea  Office Visits:  06/13/19: [name] - hesitant to treat DM unless A1c > 9%, no metformin CKD, no Actos CHF  04/12/19: [name], COVID-19 eval - tested positive 03/19/19, sent home, symptoms worsened and seen in ED on 04/02/20, sent home again, some improvement over past several days  Consult Visit:  06/12/19: [name], neuropathic pain - missed appt  03/18/20: [name], neuropathic pain - increased low back pain, taking 1 extra hydrocodone most days requesting increases rx quantity from 120-150   02/12/19: [name], ILD - completed CT chest, walking desaturation text and PFTs - all stable, reports SOB has improved, reports inhalers did not help in the past, maintains lean body weight, 2L oxygen at night. rtc 9 months  11/16/18: Cardiology - multiple stents placed 2013, 2015, 2017, cont current medications  Allergies  Allergen Reactions  . Doxazosin Mesylate Other (See Comments)    dizziness  . Methocarbamol Rash   Medications: Outpatient Encounter Medications as of 08/20/2019  Medication Sig Note  . amLODipine (NORVASC) 5 MG tablet Take 5 mg by mouth daily.   [name] aspirin EC 81 MG tablet Take 81 mg by mouth at bedtime.   . Blood Glucose Monitoring Suppl (ONE TOUCH ULTRA 2) w/Device KIT Use to obtain blood sugar daily.  Dx Code E11.40   . carvedilol (COREG) 3.125 MG tablet TAKE 1 TABLET BY MOUTH TWICE DAILY WITH MEALS   . clopidogrel (PLAVIX) 75 MG tablet TAKE 1 TABLET BY MOUTH ONCE A DAY WITH BREAKFAST.   [name] DHA-Vitamin C-Lutein (EYE HEALTH FORMULA PO) Take 1 tablet by mouth daily.    . furosemide (LASIX) 40 MG tablet Take 40 mg by mouth at bedtime. Monday, Wednesday, Friday.   [name] HYDROcodone-acetaminophen (NORCO) 10-325 MG tablet Take 1 tablet by mouth every 4 (four) hours as needed. For  Chronic pain. Max 150 tablets/month   . losartan (COZAAR) 25 MG tablet Take 25 mg by mouth daily.   . nitroGLYCERIN (NITROSTAT) 0.4 MG SL tablet DISSOLVE 1 TABLET UNDER TONGUE AS NEEDEDFOR CHEST PAIN. MAY REPEAT 5 MINUTES APART 3 TIMES IF NEEDED. IF NO RELIEF CALL 911   . OXYGEN Inhale into the lungs. Per pt- Uses Oxygen 2 liters at night.   . pantoprazole (PROTONIX) 40 MG tablet Take 1 tablet (40 mg total) by mouth daily.   . polyethylene glycol (MIRALAX / GLYCOLAX) 17 g packet Take 51 g by mouth daily.  11/20/2018: 3 capfuls   . tamsulosin (FLOMAX) 0.4 MG CAPS capsule Take 0.4 mg by mouth daily.   [name] glucose blood (ONE TOUCH ULTRA TEST) test strip USE TO CHECK BLOOD SUGAR ONCE A DAY Dx Code E11.40   . ONETOUCH DELICA LANCETS 21J MISC 1 each by In Vitro route daily. Dx Code E11.49    No facility-administered encounter medications on file as of 08/20/2019.   Current Diagnosis/Assessment:   Emergency planning/management officer Strain: Low Risk   .  Difficulty of Paying Living Expenses: Not very hard   Goals Addressed            This Visit's Progress   . Pharmacy Care Plan       CARE PLAN ENTRY  Current Barriers:  . Chronic Disease Management support, education, and care coordination needs related to Hypertension, Hyperlipidemia, and Diabetes   Hypertension . Pharmacist Clinical Goal(s): o Over the next 2 weeks, patient will work with PharmD and providers to achieve BP goal <140/90 mmHg . Current regimen:  o Amlodipine 5 mg - 1  tablet daily (Lori Foster) o Losartan 25 mg - 1 tablet daily (Lori Foster) o Carvedilol 3.125 mg - 1 tablet twice daily o Furosemide 40 mg - 1 tablet on Mon, Wed, Friday . Interventions: o Counseled on recommended blood pressure monitoring technique . Patient self care activities - Over the next 2 weeks, patient will: o Check blood pressure daily in the morning, document, and provide at future appointment o Ensure daily salt intake < 2300 mg/day  Hyperlipidemia . Pharmacist Clinical Goal(s): o Over the next 3 months, patient will work with PharmD and providers to achieve LDL goal < 70 . Current regimen:  o No pharmacotherapy o Previously on: atorvastatin, simvastatin . Interventions: o Discussed dietary changes/exercise goals . Patient self care activities - Over the next 3 months, patient will: o Limit foods high in cholesterol  o Increase walking with goal of 10-15 minutes intervals, 3 days per week  Diabetes . Pharmacist Clinical Goal(s): o Over the next 3 months, patient will work with PharmD and providers to maintain A1c goal <8% . Current regimen:  o No pharmacotherapy . Interventions: o Discussed diet and exercise goals . Patient self care activities - Over the next 3 months, patient will: o Continue to check blood sugar twice weekly before breakfast, document, and provide at future appointments o Contact provider with any episodes of hypoglycemia (blood glucose < 70) o Replace high sugar breakfast options (poptarts, chocolate milk) with healthier options including non-fat greek yogurt with fruit, shredded wheat cereal with low fat milk, boiled eggs o Limit portion sizes with the diabetes plate method: 1/2 of your plate should be lean vegetables (cabbage, leafy greens, salad, tomatoes, carrots, or squash), 1/4 of your plate lean protein (grilled chicken, fish, [place], eggs, cheese), and 1/4 of your plate starchy vegetables (whole grain rice or pasta, beans, potatoes,  fruits).  Initial goal documentation      Hypertension   CMP Latest Ref Rng & Units 06/13/2019 04/28/2018 10/17/2017  Glucose 70 - 99 mg/dL 287(H) 305(H) 231(H)  BUN 6 - 23 mg/dL 33(H) 32(H) 29(H)  Creatinine 0.40 - 1.50 mg/dL 2.06(H) 2.24(H) 2.47(H)  Sodium 135 - 145 mEq/L 133(L) 136 138  Potassium 3.5 - 5.1 mEq/L 4.6 4.8 4.7  Chloride 96 - 112 mEq/L 96 95(L) 97  CO2 19 - 32 mEq/L 33(H) 34(H) 31  Calcium 8.4 - 10.5 mg/dL 9.9 10.6(H) 9.8  Total Protein 6.0 - 8.3 g/dL 8.0 8.2 -  Total Bilirubin 0.2 - 1.2 mg/dL 0.8 0.6 -  Alkaline Phos 39 - 117 U/L 58 58 -  AST 0 - 37 U/L 14 17 -  ALT 0 - 53 U/L 9 14 -   Office blood pressures are: BP Readings from Last 3 Encounters:  06/13/19 132/80  04/12/19 (!) 168/88  04/03/19 134/73   Patient has failed these meds in the past: none Patient checks BP at home: every other day Patient home BP readings:  178/77 mmHg - this morning, 129/70, 154/80, 100/50 (time of day varies); HR --> 64, 88, Oxygen --> 94%,93%  BP < 140/90 mmHg Patient is currently uncontrolled on the following medications:   Amlodipine 5 mg - 1 tablet daily (Lori Foster)  Losartan 25 mg - 1 tablet daily (Lori Foster)  Carvedilol 3.125 mg - 1 tablet BID  Furosemide 40 mg - 1 tablet M,W,F at bedtime  We discussed: reports next appt with nephrology September 18, 2019, amlodipine and losartan added this year, discussed appropriate BP monitoring technique  Plan: Continue current medications; Recommend checking BP at the same time each day before morning medications. Repeat BP check if BG > 140/90 mmHg. Follow up call in 2 weeks to assess BP.  Hyperlipidemia/CAD   Lipid Panel     Component Value Date/Time   CHOL 209 (H) 06/13/2019 1057   TRIG 275.0 (H) 06/13/2019 1057   HDL 35.50 (L) 06/13/2019 1057   CHOLHDL 6 06/13/2019 1057   VLDL 55.0 (H) 06/13/2019 1057   LDLCALC 43 10/14/2016 1429   LDLDIRECT 125.0 06/13/2019 1057     CBC Latest Ref Rng & Units 06/13/2019 04/28/2018  06/24/2017  WBC 4.0 - 10.5 K/uL 7.0 8.3 7.8  Hemoglobin 13.0 - 17.0 g/dL 14.1 15.1 12.0(L)  Hematocrit 39.0 - 52.0 % 41.4 44.4 38.2(L)  Platelets 150.0 - 400.0 K/uL 199.0 189.0 201   LDL goal < 70, TG < 150, HDL > 40 Patient has tried these meds in past:atorvastatin (stomach pain/nausea - stopped 10/2017), simvastatin 20 mg (switched to atorvastatin) Patient is currently uncontrolled on the following medications:   No statin therapy   Aspirin 81 mg - 1 tablet daily  Clopidogrel 75 mg - 1 tablet daily  We discussed: Patient is not interested in additional medications at this time. Indefinite DAPT per cardio due to multiple stents placed, CBC stable, denies abnormal bruising or bleeding. Discussed heart healthy diet/exercise.   Plan: Continue current medications; Focus on heart healthy diet and limiting cholesterol intake. Increase exercise. Consider discussion about statin retrial if dietary changes fail.  ILD/Chronic Respiratory Failure   Followed by Dr. Chase Caller   Eosinophil count:   Lab Results  Component Value Date/Time   EOSPCT 7.1 06/24/2017 11:36 AM  %                               Eos (Absolute):  Lab Results  Component Value Date/Time   EOSABS 554 (H) 06/24/2017 11:36 AM   EOSABS 0.4 05/11/2016 12:33 PM   Tobacco Status:  Social History   Tobacco Use  Smoking Status Former Smoker  . Packs/day: 1.00  . Years: 20.00  . Pack years: 20.00  . Types: Cigarettes  . Quit date: 04/06/1963  . Years since quitting: 108.4  Smokeless Tobacco Never Used   Patient has failed these meds in past: Spiriva, Advair, Symbicort, Albuterol, Trelegy - all ineffective Patient is currently controlled on the following medications:   No pharmacotherapy  Supplemental Oxygen - 2L at bedtime  Plan: Continue management with pulmonologist   Diabetes   Recent Relevant Labs: Lab Results  Component Value Date/Time   HGBA1C 8.0 (H) 06/13/2019 10:57 AM   HGBA1C 8.9 (A) 10/27/2018 12:28  PM   HGBA1C 8.1 (H) 04/28/2018 12:28 PM   MICROALBUR 7.5 (H) 04/28/2018 12:28 PM   MICROALBUR 0.5 02/04/2009 10:09 AM    Checking BG: twice weekly before breakfast  Recent FBG Readings: 184, usually  170-180s    A1c goal < 8% Patient has failed these meds in past: metformin - CKD Patient is currently controlled on the following medications:   No pharmacotherapy  Last diabetic eye exam:  Lab Results  Component Value Date/Time   HMDIABEYEEXA No Retinopathy 02/12/2019 12:00 AM    Last diabetic foot exam:  Lab Results  Component Value Date/Time   HMDIABFOOTEX done 10/27/2018 12:00 AM    We discussed: plate method, portion sizes, increasing exercise as tolerated, try and avoid chocolate milk and pop-tarts   Diet: 3 meals/day - breakfast: shredded wheat cereal, yogurt, chocolate milk, pop-tart  - lunch: vegetables mostly (creamed potatoes, cabbage, turnip salad) - dinner: bowl of soup  Exercise: walking maybe once a week, reports some weight loss in the past year  Plan: Continue control with diet and exercise; Work towards increasing walking and limiting sugary beverages and snacks. Utilize diabetes plate method to limit portions.   BPH   Patient has failed these meds in past: none reported Patient is currently controlled on the following medications:   Tamsulosin 0.4 mg - 1 capsule daily  We discussed: denies   Plan: Continue current medications  GERD   Patient has failed these meds in past: none reported Patient is currently controlled on the following medications:   Pantoprazole 40 mg - 1 tablet daily  We discussed: denies symptoms, doing well  GI notes from past year:  11/27/18: Esophageal dilation/botox injection  09/12/18: Endoscopy - dyshpagia  08/14/18: Gastroenterology - "Recurrent dysphasia secondary to esophageal dysmotility and esophageal stricture.  He responded nicely to Botox injection therapy and esophageal dilation at [facility] December 2019; constipation,  on MiraLAX and working well" --> scheduled EGD with Botox injection and dilation   Plan: Continue current medications   Chronic Pain/Neuropathy   Followed by pain specialist Patient has failed these meds in past: Lyrica (leg swelling/cost), gabapentin, (dose limited due to CKD - ineffective), desipramine, lidocaine, carbamazepine Patient is currently controlled on the following medications:   Hydrocodone-APAP 10-325 mg - 1 tablet every 4 hours PRN  We discussed: reports well controlled  Plan: Continue current medications  Medication Management  Misc: nitroglycerin 0.4 mg SL PRN  OTCs: Ocuvite supplement, MiraLAX PRN  Pharmacy/Benefits: [employer]  Adherence: refills timely   Affordability: denies concerns  CCM Follow Up: 09/05/19 at 3:30 PM (telephone) for BP log   [name], PharmD Clinical Pharmacist [facility] at [facility] [phone]

## 2019-08-20 NOTE — Patient Instructions (Signed)
Aug 20, 2019  Dear [name],  It was a pleasure meeting you during our initial appointment on Aug 20, 2019. Below is a summary of the goals we discussed and components of chronic care management. Please contact me anytime with questions or concerns.   Visit Information  Goals Addressed            This Visit's Progress   . Pharmacy Care Plan       CARE PLAN ENTRY  Current Barriers:  . Chronic Disease Management support, education, and care coordination needs related to Hypertension, Hyperlipidemia, and Diabetes   Hypertension . Pharmacist Clinical Goal(s): o Over the next 2 weeks, patient will work with PharmD and providers to achieve BP goal <140/90 mmHg . Current regimen:  o Amlodipine 5 mg - 1 tablet daily (Lori Foster) o Losartan 25 mg - 1 tablet daily (Lori Foster) o Carvedilol 3.125 mg - 1 tablet twice daily o Furosemide 40 mg - 1 tablet on Mon, Wed, Friday . Interventions: o Counseled on recommended blood pressure monitoring technique . Patient self care activities - Over the next 2 weeks, patient will: o Check blood pressure daily in the morning, document, and provide at future appointment o Ensure daily salt intake < 2300 mg/day  Hyperlipidemia . Pharmacist Clinical Goal(s): o Over the next 3 months, patient will work with PharmD and providers to achieve LDL goal < 70 . Current regimen:  o No pharmacotherapy o Previously on: atorvastatin, simvastatin . Interventions: o Discussed dietary changes/exercise goals . Patient self care activities - Over the next 3 months, patient will: o Limit foods high in cholesterol  o Increase walking with goal of 10-15 minutes intervals, 3 days per week  Diabetes . Pharmacist Clinical Goal(s): o Over the next 3 months, patient will work with PharmD and providers to maintain A1c goal <8% . Current regimen:  o No pharmacotherapy . Interventions: o Discussed diet and exercise goals . Patient self care activities - Over  the next 3 months, patient will: o Continue to check blood sugar twice weekly before breakfast, document, and provide at future appointments o Contact provider with any episodes of hypoglycemia (blood glucose < 70) o Replace high sugar breakfast options (poptarts, chocolate milk) with healthier options including non-fat greek yogurt with fruit, shredded wheat cereal with low fat milk, boiled eggs o Limit portion sizes with the diabetes plate method: 1/2 of your plate should be lean vegetables (cabbage, leafy greens, salad, tomatoes, carrots, or squash), 1/4 of your plate lean protein (grilled chicken, fish, [place], eggs, cheese), and 1/4 of your plate starchy vegetables (whole grain rice or pasta, beans, potatoes, fruits).  Initial goal documentation       [name] was given information about Chronic Care Management services today including:  1. CCM service includes personalized support from designated clinical staff supervised by his physician, including individualized plan of care and coordination with other care providers 2. 24/7 contact phone numbers for assistance for urgent and routine care needs. 3. Standard insurance, coinsurance, copays and deductibles apply for chronic care management only during months in which we provide at least 20 minutes of these services. Most insurances cover these services at 100%, however patients may be responsible for any copay, coinsurance and/or deductible if applicable. This service may help you avoid the need for more expensive face-to-face services. 4. Only one practitioner may furnish and bill the service in a calendar month. 5. The patient may stop CCM services at any time (effective at  the end of the month) by phone call to the office staff.  Patient agreed to services and verbal consent obtained.   The patient verbalized understanding of instructions provided today and agreed to receive a mailed copy of patient instruction and/or educational  materials. Telephone follow up appointment with pharmacy team member scheduled for: 09/05/19 at 3:30 PM (telephone) for blood pressure log   [name], PharmD Clinical Pharmacist [facility] at [facility] [phone]  Cholesterol Content in Foods Cholesterol is a waxy, fat-like substance that helps to carry fat in the blood. The body needs cholesterol in small amounts, but too much cholesterol can cause damage to the arteries and heart. Most people should eat less than 200 milligrams (mg) of cholesterol a day. Foods with cholesterol  Cholesterol is found in animal-based foods, such as meat, seafood, and dairy. Generally, low-fat dairy and lean meats have less cholesterol than full-fat dairy and fatty meats. The milligrams of cholesterol per serving (mg per serving) of common cholesterol-containing foods are listed below. Meat and other proteins  Egg -- one large whole egg has 186 mg.  Veal shank -- 4 oz has 141 mg.  Lean ground [place] (93% lean) -- 4 oz has 118 mg.  Fat-trimmed lamb loin -- 4 oz has 106 mg.  Lean ground beef (90% lean) -- 4 oz has 100 mg.  Lobster -- 3.5 oz has 90 mg.  Pork loin chops -- 4 oz has 86 mg.  Canned salmon -- 3.5 oz has 83 mg.  Fat-trimmed beef top loin -- 4 oz has 78 mg.  Frankfurter -- 1 frank (3.5 oz) has 77 mg.  Crab -- 3.5 oz has 71 mg.  Roasted chicken without skin, white meat -- 4 oz has 66 mg.  Light bologna -- 2 oz has 45 mg.  Deli-cut [place] -- 2 oz has 31 mg.  Canned tuna -- 3.5 oz has 31 mg.  Berniece Salines -- 1 oz has 29 mg.  Oysters and mussels (raw) -- 3.5 oz has 25 mg.  Mackerel -- 1 oz has 22 mg.  Trout -- 1 oz has 20 mg.  Pork sausage -- 1 link (1 oz) has 17 mg.  Salmon -- 1 oz has 16 mg.  Tilapia -- 1 oz has 14 mg. Dairy  Soft-serve ice cream --  cup (4 oz) has 103 mg.  Whole-milk yogurt -- 1 cup (8 oz) has 29 mg.  Cheddar cheese -- 1 oz has 28 mg.  American cheese -- 1 oz has 28 mg.  Whole  milk -- 1 cup (8 oz) has 23 mg.  2% milk -- 1 cup (8 oz) has 18 mg.  Cream cheese -- 1 tablespoon (Tbsp) has 15 mg.  Cottage cheese --  cup (4 oz) has 14 mg.  Low-fat (1%) milk -- 1 cup (8 oz) has 10 mg.  Sour cream -- 1 Tbsp has 8.5 mg.  Low-fat yogurt -- 1 cup (8 oz) has 8 mg.  Nonfat Greek yogurt -- 1 cup (8 oz) has 7 mg.  Half-and-half cream -- 1 Tbsp has 5 mg. Fats and oils  Cod liver oil -- 1 tablespoon (Tbsp) has 82 mg.  Butter -- 1 Tbsp has 15 mg.  Lard -- 1 Tbsp has 14 mg.  Bacon grease -- 1 Tbsp has 14 mg.  Mayonnaise -- 1 Tbsp has 5-10 mg.  Margarine -- 1 Tbsp has 3-10 mg. Exact amounts of cholesterol in these foods may vary depending on specific ingredients and brands.  Foods without cholesterol Most plant-based foods do not have cholesterol unless you combine them with a food that has cholesterol. Foods without cholesterol include:  Grains and cereals.  Vegetables.  Fruits.  Vegetable oils, such as olive, canola, and sunflower oil.  Legumes, such as peas, beans, and lentils.  Nuts and seeds.  Egg whites. Summary  The body needs cholesterol in small amounts, but too much cholesterol can cause damage to the arteries and heart.  Most people should eat less than 200 milligrams (mg) of cholesterol a day. This information is not intended to replace advice given to you by your health care provider. Make sure you discuss any questions you have with your health care provider. Document Revised: 03/04/2017 Document Reviewed: 11/16/2016 Elsevier Patient Education  [place].

## 2019-08-20 NOTE — Telephone Encounter (Signed)
Patient would like to know if he could refill Zofran for occasional nausea.  [name], PharmD Clinical Pharmacist [facility] at [facility] [phone]

## 2019-08-21 MED ORDER — ONDANSETRON HCL 4 MG PO TABS: ORAL_TABLET | ORAL | 1 refills | Status: DC

## 2019-08-21 NOTE — Telephone Encounter (Signed)
Left message on cell vm per dpr that rx was sent to the pharmacy.

## 2019-08-21 NOTE — Telephone Encounter (Signed)
Dr Silvio Pate is out of the office. This will have to wait for him to decide.

## 2019-08-21 NOTE — Telephone Encounter (Signed)
Please let him know I sent the refill

## 2019-08-22 ENCOUNTER — Ambulatory Visit (INDEPENDENT_AMBULATORY_CARE_PROVIDER_SITE_OTHER): Payer: Medicare Other | Admitting: Physical Medicine and Rehabilitation

## 2019-08-22 ENCOUNTER — Other Ambulatory Visit: Payer: Self-pay

## 2019-08-22 ENCOUNTER — Encounter: Payer: Self-pay | Admitting: Physical Medicine and Rehabilitation

## 2019-08-22 VITALS — BP 137/73 | HR 67 | Ht 72.0 in | Wt 192.0 lb

## 2019-08-22 DIAGNOSIS — M545 Low back pain: Secondary | ICD-10-CM | POA: Diagnosis not present

## 2019-08-22 DIAGNOSIS — M47816 Spondylosis without myelopathy or radiculopathy, lumbar region: Secondary | ICD-10-CM | POA: Diagnosis not present

## 2019-08-22 DIAGNOSIS — M48062 Spinal stenosis, lumbar region with neurogenic claudication: Secondary | ICD-10-CM | POA: Diagnosis not present

## 2019-08-22 DIAGNOSIS — G8929 Other chronic pain: Secondary | ICD-10-CM | POA: Diagnosis not present

## 2019-08-22 DIAGNOSIS — G894 Chronic pain syndrome: Secondary | ICD-10-CM | POA: Diagnosis not present

## 2019-08-22 NOTE — Progress Notes (Signed)
Pt states pain in the right side of the lower back. Pt states pain started two months ago. Moving around makes pain worse. Sitting and resting helps with pain.   .Numeric Pain Rating Scale and Functional Assessment Average Pain 10 Pain Right Now 0 My pain is intermittent and sharp Pain is worse with: walking, bending and some activites Pain improves with: rest   In the last MONTH (on 0-10 scale) has pain interfered with the following?  1. General activity like being  able to carry out your everyday physical activities such as walking, climbing stairs, carrying groceries, or moving a chair?  Rating(9)  2. Relation with others like being able to carry out your usual social activities and roles such as  activities at home, at work and in your community. Rating(9)  3. Enjoyment of life such that you have  been bothered by emotional problems such as feeling anxious, depressed or irritable?  Rating(5)

## 2019-08-23 ENCOUNTER — Encounter: Payer: Self-pay | Admitting: Physical Medicine and Rehabilitation

## 2019-08-23 NOTE — Progress Notes (Signed)
[name] - 86 y.o. male MRN [id number]  Date of birth: [birth year]  Office Visit Note: Visit Date: 08/22/2019 PCP: [name], MD Referred by: [name], MD  Subjective: Chief Complaint  Patient presents with  . Lower Back - Pain   HPI: [name] is a 86 y.o. male who comes in today For evaluation and management of chronic worsening severe at times right-sided lower back pain without any referral.  The last time I saw the patient was in 2017.  We had completed radiofrequency ablation of his lower facet joints with good relief of his axial pain that was more chronic with standing but never really touched a lot of the pain that he was having more focally on the right and left at the time.  Today he reports 10 out of 10 pain on the right without specific injury.  He said it started about 2 months ago and he says moving around and twisting makes the pain worse.  Standing is problematic.  He says sitting and resting seems to help.  He is really unsure if his current hydrocodone helps at all.  He has no radicular pain down the legs no new changes of any sort of nerve issues in the legs.  He has been seen in the office by Dr. Meridee Score for problems with his contractures in his lower extremity.  I have actually seen his wife more recently for a shoulder injection and so we had spoken at that time.  He is also treated by Dr. Gillis Santa at the [facility].  His notes were reviewed today at length.  Interestingly in 2017 instead of repeating the ablation we did get MRI of the lumbar spine and that is reviewed again below.  He does have facet arthropathy and central canal narrowing at what they termed L5-S1.  The patient does have a transitional S1 segment which seems to be lumbarized.  We try to make that clear in the past but had completed injections at the level of stenosis in the remote past I had the look this up on our old charting  system.  At the time he did end up with a lumbar corset that does seem to help while he was up moving around and he was very willing at that point just to stick with a lumbar corset.  His case is complicated by rheumatoid arthritis.  He is followed by Dr. Cy Blamer.  Review of Systems  Constitutional: Positive for malaise/fatigue.  Musculoskeletal: Positive for back pain and joint pain.  All other systems reviewed and are negative.  Otherwise per HPI.  Assessment & Plan: Visit Diagnoses:  1. Chronic right-sided low back pain without sciatica   2. Spondylosis without myelopathy or radiculopathy, lumbar region   3. Spinal stenosis of lumbar region with neurogenic claudication   4. Chronic pain syndrome     Plan: Findings:  Chronic history of lumbar spine and back pain with some history of referral into the buttocks with history of myofascial pain as well.  MRI was updated in 2017 showing stenosis at what is termed the L5-S1 level but really looks like L4-5 with a transitional S1 segment.  In the past he has had radiofrequency ablation of the lower facet joints with some relief of his back pain but never really much of a relief with his focal pain that will come and go.  He has had several month onset of this  right-sided low back pain somewhat painful with palpation today but no real focal trigger point noted.  He is very stiff through the lumbar spine on exam.  He currently is treated by Dr. Carlus Pavlov and has an upcoming appointment with him in June.  We talked today about trying lidocaine patches over this area and even perhaps trying Voltaren gel.  I think the best approach is for me to send my note to Dr. Carlus Pavlov and that would be more efficacious and efficient to have him treated there if they wanted to look at any interventional spine procedure or further evaluation for him and he agrees with this.  I did tell him I am happy to see him depending on what they think that I have seen him in the past  but he really needs more comprehensive pain management that we really do not have the ability to offer here.     Meds & Orders: No orders of the defined types were placed in this encounter.  No orders of the defined types were placed in this encounter.   Follow-up: Return if symptoms worsen or fail to improve.   Procedures: No procedures performed  No notes on file   Clinical History: Lumbar spine 03/12/2016 MRI  IMPRESSION:  L5-S1: Prominent central narrowing of the thecal sac with mild bilateral foraminal stenosis and mild left and borderline right subarticular lateral recess stenosis due to short pedicles, disc bulge, and facet arthropathy. Cross-sectional area of the thecal sac 0.6 cm^ 2, formerly 0.8 cm^2.   1. A transitional lumbosacral vertebra is assumed to represent the S1 level. Careful correlation with this numbering strategy prior to any procedural intervention would be recommended. 2. Lumbar spondylosis and degenerative disc disease along with congenitally short pedicles, causing prominent impingement at L5-S1; moderate impingement at L4-5; and mild impingement at L3-4 and S1-2, as detailed above. The findings at L5-S1 have worsened in the interval. 3. Stable mildly low-lying conus without tethering mass, although this low position of the conus is partially due to the classification scheme in which S1 is fully segmental.   He reports that he quit smoking about 56 years ago. His smoking use included cigarettes. He has a 20.00 pack-year smoking history. He has never used smokeless tobacco.  Recent Labs    10/27/18 1228 06/13/19 1057  HGBA1C 8.9* 8.0*    Objective:  VS:  HT:6' (182.9 cm)   WT:192 lb (87.1 kg)  BMI:26.03    BP:137/73  HR:67bpm  TEMP: ( )  RESP:  Physical Exam Vitals and nursing note reviewed.  Constitutional:      General: He is not in acute distress.    Appearance: Normal appearance. He is well-developed.  HENT:     Head:  Normocephalic and atraumatic.  Eyes:     Conjunctiva/sclera: Conjunctivae normal.     Pupils: Pupils are equal, round, and reactive to light.  Cardiovascular:     Rate and Rhythm: Normal rate.     Pulses: Normal pulses.     Heart sounds: Normal heart sounds.  Pulmonary:     Effort: Pulmonary effort is normal. No respiratory distress.  Musculoskeletal:        General: Tenderness present. No swelling or deformity.     Cervical back: Neck supple. No rigidity or tenderness.     Right lower leg: No edema.     Left lower leg: No edema.     Comments: He is very slow to rise from a seated position.  He has  some balance difficulty.  He is very stiff with trying to get any sort of flexion or extension from the lumbar spine.  He has mild pain concordant with extension of his lower spine.  He does have taut bands but no specific trigger point in this right focal area where he hurts.  The area where it hurts is located about the L4 region at about the same level as the top of the iliac crest.  He has no pain with hip rotation.  He has some decreased range of motion at the ankle with good strength without clonus.  Skin:    General: Skin is warm and dry.     Findings: No erythema or rash.  Neurological:     General: No focal deficit present.     Mental Status: He is alert and oriented to person, place, and time.     Sensory: No sensory deficit.     Coordination: Coordination normal.     Gait: Gait abnormal.  Psychiatric:        Mood and Affect: Mood normal.        Behavior: Behavior normal.     Ortho Exam  Imaging: No results found.  Past Medical/Family/Surgical/Social History: Medications & Allergies reviewed per EMR, new medications updated. Patient Active Problem List   Diagnosis Date Noted  . COVID-19 virus infection 04/12/2019  . Long term prescription opiate use 12/14/2018  . Neuropathy 12/14/2018  . CKD stage 3 due to type 2 diabetes mellitus ([place]) 12/14/2018  . Problems with  swallowing and mastication   . Achalasia   . Right inguinal hernia 07/04/2018  . Advance directive discussed with patient 04/28/2018  . Chronic pain of both lower extremities 10/26/2017  . Neuropathic pain 10/26/2017  . Chronic pain syndrome 10/26/2017  . Iron deficiency anemia 07/05/2017  . Constipation 07/05/2017  . CKD (chronic kidney disease) stage 4, GFR 15-29 ml/min ([name]) 05/17/2017  . Seropositive rheumatoid arthritis ([place]) 05/17/2017  . DM (diabetes mellitus) type II controlled, neurological manifestation ([place]) 05/17/2017  . Atherosclerotic heart disease of native coronary artery with angina pectoris ([name]) 05/17/2017  . Dysphagia 05/17/2017  . High risk medication use 10/21/2016  . Primary osteoarthritis of both knees 10/21/2016  . History of right knee joint replacement 10/21/2016  . Orthostatic hypotension 10/01/2016  . Tegretol-induced dizziness 05/14/2016  . Spinal stenosis of lumbar region without neurogenic claudication 03/23/2016  . Spondylosis without myelopathy or radiculopathy, lumbar region 03/23/2016  . Respiratory failure, chronic ([place]) 11/21/2014  . Rheumatoid arthritis ([place]) 11/05/2014  . Rheumatic fever without heart involvement 03/04/2014  . Ventral hernia 12/17/2013  . Routine general medical examination at a health care facility 08/29/2012  . Routine history and physical examination of adult 08/29/2012  . ILD (interstitial lung disease) ([place]) 11/28/2011  . Chronic diastolic heart failure ([place]) 09/14/2011  . Diabetic polyneuropathy associated with type 2 diabetes mellitus ([place]) 08/10/2010  . Obstructive sleep apnea 12/17/2008  . ESOPHAGEAL STRICTURE 10/09/2008  . Esophageal stricture 10/09/2008  . Reflux esophagitis 09/10/2008  . Diverticulosis of colon 09/10/2008  . Gastro-esophageal reflux disease with esophagitis 09/10/2008  . Coronary atherosclerosis 03/19/2008  . Aortic valve disorder 03/19/2008  . Thoracic aorta atherosclerosis ([place]) 03/19/2008    . Actinic keratosis 10/23/2007  . SLEEP DISORDER, CHRONIC 10/17/2006  . Disturbance in sleep behavior 10/17/2006  . 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 Essential hypertension 09/23/2006  . GERD 09/23/2006  . BENIGN PROSTATIC HYPERTROPHY 09/23/2006  . Enlarged prostate without lower urinary tract symptoms (  luts) 09/23/2006  . HLD (hyperlipidemia) 09/23/2006   Past Medical History:  Diagnosis Date  . Arthritis    osteoarthritis, s/p R TKR, and digits  . CAD (coronary artery disease)    a. s/p CABG (2001)  b. s/p DES to RCA and cutting POBA to ostial PDA (2013)   c. s/p DES to SVG to OM2 (01/14/14) d. cath: 08/2015 NSTEMI w/ patent LIMA-LAD and 99% stenosis of SVG-OM w/ DES placed. CTO of SVG-RCA and SVG-D1.   . Cataract   . Chronic diastolic CHF (congestive heart failure) ([place])    a) 09/13 ECHO- LVEF 95-28%, grade 1 diastolic dysfunction, mild LA dilatation, atrial septal aneurysm, AV mobility restricted, but no sig AS by doppler; b) 09/04/08 ECHO- LVH, ef 60%, mild AS, c. echo 08/2015: EF perserved of 55-60% with inferolateral HK. Mild AS noted.  . Chronic kidney disease, stage III (moderate)   . Chronic lower back pain   . Colon polyps   . COVID-19   . Diverticulosis   . Dyspnea 2009 since July -Sept   05/06/08-CPST-  normal effort, reduced VO2 max 20.5 /65%, reduced at 8.2/ 40%, normal breathing resetvca of 55%, submaximal heart rate response 112/77%, flattened o2 pluse response at peak exercise-12 ml/beat @ 85%, No VQ mismatch abnormalities, All c/w CIRC Limitation  . Enlarged prostate   . Esophageal stricture    a. s/p dilation spring 2010  . GERD (gastroesophageal reflux disease)   . Heart murmur   . Hiatal hernia   . History of carpal tunnel syndrome    Bilateral  . History of kidney stones   . History of PFTs    mixed pattern on spiro. mild restn on lung volumes with near normal DLCO. Pattern can be explained by CABG scar. Fev1 2.2L/73%, ratio 68  (67), TLC 4.7/68%,RV 1.5L/55%,DLCO 79%  . Hyperlipidemia   . Hypertension   . Interstitial lung disease ([name])    NOS  . Iron deficiency anemia   . Nausea & vomiting    2018/2019  . On home oxygen therapy    2 L [place] at bedtime  . Osteoporosis   . Overweight (BMI 25.0-29.9)    BMI 29  . Peripheral neuropathy   . RA (rheumatoid arthritis) ([name])    [name]  . Seropositive rheumatoid arthritis ([place])   . Type II diabetes mellitus ([name])    diet controlled  . Wears glasses   . Wears partial dentures    upper   Family History  Problem Relation Age of Onset  . COPD Mother   . Heart disease Father   . Heart attack Father   . Stomach cancer Brother   . Diabetes Brother   . Colon cancer Brother 25  . Alcohol abuse Sister   . Stroke Sister   . Rectal cancer Neg Hx    Past Surgical History:  Procedure Laterality Date  . BALLOON DILATION N/A 09/12/2018   Procedure: BALLOON DILATION;  Surgeon: Irene Shipper, MD;  Location: [facility];  Service: Endoscopy;  Laterality: N/A;  . BOTOX INJECTION N/A 09/12/2018   Procedure: BOTOX INJECTION;  Surgeon: Irene Shipper, MD;  Location: [facility];  Service: Endoscopy;  Laterality: N/A;  . BOTOX INJECTION N/A 11/27/2018   Procedure: BOTOX INJECTION;  Surgeon: Irene Shipper, MD;  Location: [facility];  Service: Endoscopy;  Laterality: N/A;  . CARDIAC CATHETERIZATION  08/2004   CP- no MI, Cath- small vessell disease   . CARDIAC CATHETERIZATION  12/31/2011  80% distal LM, 100% native LAD, LCx and RCA, 30% prox SVG-OM, SVG-D1 normal, 99% distal, 80% ostial SVG-RCA distal to graft, LIMA-LAD normal; LVEF mildly decreased with posterior basal AK   . CARDIAC CATHETERIZATION  2009   with patent grafts/notes 12/31/2011  . CARDIAC CATHETERIZATION N/A 08/13/2015   Procedure: Left Heart Cath and Cors/Grafts Angiography;  Surgeon: Sherren Mocha, MD;  Location: [facility];  Service: Cardiovascular;  Laterality: N/A;  . CATARACT EXTRACTION W/  INTRAOCULAR LENS  IMPLANT, BILATERAL Bilateral   . CHOLECYSTECTOMY OPEN  11/2003   [name]  . CORONARY ANGIOPLASTY WITH STENT PLACEMENT  01/03/2012   Successful DES to SVG-RCA and cutting balloon angioplasty ostial  PDA   . CORONARY ANGIOPLASTY WITH STENT PLACEMENT  01/14/2014   "1"  . CORONARY ARTERY BYPASS GRAFT  11/1999   CABG X5  . CORONARY STENT PLACEMENT  02/2012   1 stent and balloon  . ESOPHAGEAL DILATION  11/27/2018   Procedure: ESOPHAGEAL DILATION;  Surgeon: Irene Shipper, MD;  Location: [facility];  Service: Endoscopy;;  . ESOPHAGOGASTRODUODENOSCOPY N/A 03/01/2017   Procedure: ESOPHAGOGASTRODUODENOSCOPY (EGD);  Surgeon: Irene Shipper, MD;  Location: [facility];  Service: Endoscopy;  Laterality: N/A;  . ESOPHAGOGASTRODUODENOSCOPY (EGD) WITH ESOPHAGEAL DILATION  2010  . ESOPHAGOGASTRODUODENOSCOPY (EGD) WITH PROPOFOL N/A 09/12/2018   Procedure: ESOPHAGOGASTRODUODENOSCOPY (EGD) WITH PROPOFOL;  Surgeon: Irene Shipper, MD;  Location: [facility];  Service: Endoscopy;  Laterality: N/A;  . ESOPHAGOGASTRODUODENOSCOPY (EGD) WITH PROPOFOL N/A 11/27/2018   Procedure: ESOPHAGOGASTRODUODENOSCOPY (EGD) WITH PROPOFOL, WITH BALLOON DILATION;  Surgeon: Irene Shipper, MD;  Location: [facility];  Service: Endoscopy;  Laterality: N/A;  . HAND SURGERY     bilateral carpal tunnel releases  . JOINT REPLACEMENT    . KNEE ARTHROSCOPY Right 2008  . LEFT AND RIGHT HEART CATHETERIZATION WITH CORONARY ANGIOGRAM  12/31/2011   Procedure: LEFT AND RIGHT HEART CATHETERIZATION WITH CORONARY ANGIOGRAM;  Surgeon: Burnell Blanks, MD;  Location: [facility] CATH LAB;  Service: Cardiovascular;;  . LEFT AND RIGHT HEART CATHETERIZATION WITH CORONARY ANGIOGRAM N/A 01/14/2014   Procedure: LEFT AND RIGHT HEART CATHETERIZATION WITH CORONARY ANGIOGRAM;  Surgeon: Peter M Martinique, MD;  Location[facility] C[facility] LAB;  Service: Cardiovascular;  Laterality: N/A;  . MALONEY DILATION  03/01/2017   Procedure: Venia Minks DILATION;  Surgeon:  Irene Shipper, MD;  Location: [facility];  Service: Endoscopy;;  . PERCUTANEOUS CORONARY INTERVENTION-BALLOON ONLY  01/03/2012   Procedure: PERCUTANEOUS CORONARY INTERVENTION-BALLOON ONLY;  Surgeon: Peter M Martinique, MD;  Location: [facility] CATH LAB;  Service: Cardiovascular;;  . PERCUTANEOUS CORONARY STENT INTERVENTION (PCI-S)  12/31/2011   Procedure: PERCUTANEOUS CORONARY STENT INTERVENTION (PCI-S);  Surgeon: Burnell Blanks, MD;  Location: [facility] CATH LAB;  Service: Cardiovascular;;  . PERCUTANEOUS CORONARY STENT INTERVENTION (PCI-S) N/A 01/03/2012   Procedure: PERCUTANEOUS CORONARY STENT INTERVENTION (PCI-S);  Surgeon: Peter M Martinique, MD;  Location: [facility] CATH LAB;  Service: Cardiovascular;  Laterality: N/A;  . SHOULDER ARTHROSCOPY WITH OPEN ROTATOR CUFF REPAIR AND DISTAL CLAVICLE ACROMINECTOMY Left 02/27/2013   Procedure: LEFT SHOULDER ARTHROSCOPY WITH MINI OPEN ROTATOR CUFF REPAIR AND SUBACROMIAL DECOMPRESSION AND DISTAL CLAVICLE RESECTION;  Surgeon: [name], MD;  Location: [facility];  Service: Orthopedics;  Laterality: Left;  . TOTAL KNEE ARTHROPLASTY Right 03/2010   [name]  . TRIGGER FINGER RELEASE Left 02/27/2013   Procedure: RELEASE TRIGGER FINGER/A-1 PULLEY;  Surgeon: [name], MD;  Location: [facility];  Service: Orthopedics;  Laterality: Left;   Social History   Occupational  History  . Occupation: Designer, jewellery: RETIRED    Comment: retired  Tobacco Use  . Smoking status: Former Smoker    Packs/day: 1.00    Years: 20.00    Pack years: 20.00    Types: Cigarettes    Quit date: 04/06/1963    Years since quitting: 56.4  . Smokeless tobacco: Never Used  Substance and Sexual Activity  . Alcohol use: No    Alcohol/week: 0.0 standard drinks    Comment: 01/01/2012 "last alcohol ~ 50 yr ago"  . Drug use: No  . Sexual activity: Not Currently

## 2019-09-04 DIAGNOSIS — N184 Chronic kidney disease, stage 4 (severe): Secondary | ICD-10-CM | POA: Diagnosis not present

## 2019-09-05 ENCOUNTER — Ambulatory Visit: Payer: Medicare Other

## 2019-09-05 ENCOUNTER — Other Ambulatory Visit: Payer: Self-pay

## 2019-09-05 DIAGNOSIS — E1140 Type 2 diabetes mellitus with diabetic neuropathy, unspecified: Secondary | ICD-10-CM

## 2019-09-05 DIAGNOSIS — I10 Essential (primary) hypertension: Secondary | ICD-10-CM

## 2019-09-05 DIAGNOSIS — I25119 Atherosclerotic heart disease of native coronary artery with unspecified angina pectoris: Secondary | ICD-10-CM

## 2019-09-05 NOTE — Chronic Care Management (AMB) (Signed)
Chronic Care Management Pharmacy  Name: [name]  MRN: [id number] DOB: 17-Jul-1931  Chief Complaint/ HPI  [name],  86 y.o., male presents for their Follow-Up CCM visit with the clinical pharmacist via telephone.  PCP : [name] Carbon, MD  Their chronic conditions include: hypertension, hyperlipidemia, coronary atherosclerosis, chronic diastolic heart failure, sleep apnea, ILD, chronic resp. failure, GERD, type 2 DM, CKD, polyneuropathy, rheumatoid arthritis, BPH, chronic pain, chronic sleep disorder  Patient concerns:  denies medication concerns  Office Visits: last CCM visit 08/20/19   08/22/19: Right-sided low back pain - talked today about trying lidocaine patches over this area and even perhaps trying Voltaren gel. F/u with Dr. Carlus [name] in June.   3/10/21Silvio [name], AWV - hesitant to treat DM unless A1c > 9%, no metformin CKD, no Actos CHF  04/12/19: [name], COVID-19 eval - tested positive 03/19/19, sent home, symptoms worsened and seen in ED on 04/02/20, sent home again, some improvement over past several days  Consult Visit:  06/12/19: [name], neuropathic pain - missed appt  03/18/20: [name], neuropathic pain - increased low back pain, taking 1 extra hydrocodone most days requesting increases rx quantity from 120-150   02/13/19: Nephrology - start amlodipine 2.5 mg daily, call once week with BP, defer RAASi for now  02/12/19: [name], ILD - completed CT chest, walking desaturation text and PFTs - all stable, reports SOB has improved, reports inhalers did not help in the past, maintains lean body weight, 2L oxygen at night. rtc 9 months  11/16/18: Cardiology - multiple stents placed 2013, 2015, 2017, cont current medications  Allergies  Allergen Reactions  . Doxazosin Mesylate Other (See Comments)    dizziness  . Methocarbamol Rash   Medications: Outpatient Encounter Medications as of 09/05/2019  Medication Sig Note  . amLODipine (NORVASC) 5 MG tablet Take 5 mg  by mouth daily.   [name] aspirin EC 81 MG tablet Take 81 mg by mouth at bedtime.   . Blood Glucose Monitoring Suppl (ONE TOUCH ULTRA 2) w/Device KIT Use to obtain blood sugar daily. Dx Code E11.40   . carvedilol (COREG) 3.125 MG tablet TAKE 1 TABLET BY MOUTH TWICE DAILY WITH MEALS   . clopidogrel (PLAVIX) 75 MG tablet TAKE 1 TABLET BY MOUTH ONCE A DAY WITH BREAKFAST.   [name] DHA-Vitamin C-Lutein (EYE HEALTH FORMULA PO) Take 1 tablet by mouth daily.    . furosemide (LASIX) 40 MG tablet Take 40 mg by mouth at bedtime. Monday, Wednesday, Friday.   [name] glucose blood (ONE TOUCH ULTRA TEST) test strip USE TO CHECK BLOOD SUGAR ONCE A DAY Dx Code E11.40   . HYDROcodone-acetaminophen (NORCO) 10-325 MG tablet Take 1 tablet by mouth every 4 (four) hours as needed. For  Chronic pain. Max 150 tablets/month   . losartan (COZAAR) 25 MG tablet Take 25 mg by mouth daily.   . nitroGLYCERIN (NITROSTAT) 0.4 MG SL tablet DISSOLVE 1 TABLET UNDER TONGUE AS NEEDEDFOR CHEST PAIN. MAY REPEAT 5 MINUTES APART 3 TIMES IF NEEDED. IF NO RELIEF CALL 911   . ondansetron (ZOFRAN) 4 MG tablet TAKE 1 TABLET BY MOUTH EVERY 8 HOURS AS NEEDED FOR NAUSEA OR VOMITING   . ONETOUCH DELICA LANCETS 34L MISC 1 each by In Vitro route daily. Dx Code E11.49   . OXYGEN Inhale into the lungs. Per pt- Uses Oxygen 2 liters at night.   . pantoprazole (PROTONIX) 40 MG tablet Take 1 tablet (40 mg total) by mouth daily.   . polyethylene glycol (MIRALAX /  GLYCOLAX) 17 g packet Take 51 g by mouth daily.  11/20/2018: 3 capfuls   . tamsulosin (FLOMAX) 0.4 MG CAPS capsule Take 0.4 mg by mouth daily.    No facility-administered encounter medications on file as of 09/05/2019.   Current Diagnosis/Assessment:   Emergency planning/management officer Strain: Low Risk   . Difficulty of Paying Living Expenses: Not very hard   Goals Addressed            This Visit's Progress   . Pharmacy Care Plan       CARE PLAN ENTRY  Current Barriers:  . Chronic Disease Management support,  education, and care coordination needs related to Hypertension, Hyperlipidemia, and Diabetes   Hypertension . Pharmacist Clinical Goal(s): o Over the next 30 days, patient will work with PharmD and providers to achieve BP goal <140/90 mmHg . Current regimen:  o Amlodipine 5 mg - 1 tablet daily ([name]) o Losartan 25 mg - 1 tablet daily ([name]) o Carvedilol 3.125 mg - 1 tablet twice daily o Furosemide 40 mg - 1 tablet on Mon, Wed, Friday . Interventions: o Reviewed home blood pressure readings and left voicemail with nephrologist for further review . Patient self care activities - Over the next 30 days, patient will: o Continue to check blood pressure at least twice weekly in the morning, document, and provide at future appointment - check daily if feasible. o Ensure daily salt intake < 2300 mg/day  Hyperlipidemia . Pharmacist Clinical Goal(s): o Over the next 30 days, patient will work with PharmD and providers to achieve LDL goal < 70 . Current regimen:  o No pharmacotherapy o Previously on: atorvastatin, simvastatin . Interventions: o Discussed dietary changes/exercise goals . Patient self care activities - Over the next 30 days, patient will: o Limit foods high in cholesterol  o Increase walking with goal of 10-15 minutes intervals, 3 days per week  Diabetes . Pharmacist Clinical Goal(s): o Over the next 30 days, patient will work with PharmD and providers to maintain A1c goal <8% . Current regimen:  o No pharmacotherapy . Interventions: o Discussed diet and exercise goals . Patient self care activities - Over the next 30 days, patient will: o Continue to check blood sugar twice weekly before breakfast, document, and provide at future appointments  o Contact provider with any episodes of hypoglycemia (blood glucose < 70) o Replace high sugar breakfast options (poptarts, chocolate milk) with healthier options including non-fat greek yogurt with fruit, shredded wheat  cereal with low fat milk, boiled eggs o Limit portion sizes with the diabetes plate method: 1/2 of your plate should be lean vegetables (cabbage, leafy greens, salad, tomatoes, carrots, or squash), 1/4 of your plate lean protein (grilled chicken, fish, [place], eggs, cheese), and 1/4 of your plate starchy vegetables (whole grain rice or pasta, beans, potatoes, fruits).  Please see past updates related to this goal by clicking on the "Past Updates" button in the selected goal       Hypertension   CMP Latest Ref Rng & Units 06/13/2019 04/28/2018 10/17/2017  Glucose 70 - 99 mg/dL 287(H) 305(H) 231(H)  BUN 6 - 23 mg/dL 33(H) 32(H) 29(H)  Creatinine 0.40 - 1.50 mg/dL 2.06(H) 2.24(H) 2.47(H)  Sodium 135 - 145 mEq/L 133(L) 136 138  Potassium 3.5 - 5.1 mEq/L 4.6 4.8 4.7  Chloride 96 - 112 mEq/L 96 95(L) 97  CO2 19 - 32 mEq/L 33(H) 34(H) 31  Calcium 8.4 - 10.5 mg/dL 9.9 10.6(H) 9.8  Total Protein 6.0 -  8.3 g/dL 8.0 8.2 -  Total Bilirubin 0.2 - 1.2 mg/dL 0.8 0.6 -  Alkaline Phos 39 - 117 U/L 58 58 -  AST 0 - 37 U/L 14 17 -  ALT 0 - 53 U/L 9 14 -   Office blood pressures are: BP Readings from Last 3 Encounters:  08/22/19 137/73  06/13/19 132/80  04/12/19 (!) 168/88   Patient has failed these meds in the past: none Patient checks BP at home: every other day, before breakfast, after waking  Patient home BP readings:   5/19 - 137/71 mmHg  5/22 - 189/85 mmHg  6/1 - 183/81 mmHg  6/2 - 151/75 mmHg  BP < 140/90 mmHg Patient is currently uncontrolled on the following medications:   Amlodipine 5 mg - 1 tablet daily ([name])  Losartan 25 mg - 1 tablet daily ([name])  Carvedilol 3.125 mg - 1 tablet BID  Furosemide 40 mg - 1 tablet M,W,F at bedtime  We discussed: denies missing any doses of BP medications, reports blood work with nephrology completed 09/04/19 and appt 09/18/19   Plan: Continue current medications; Will alert nephrology of elevated BP readings for further  management. Recommend continuing to check BP at the same time each day before morning medications. Repeat BP check if BG > 140/90 mmHg. Follow up call in 1 month to assess.   Hyperlipidemia/CAD   Lipid Panel     Component Value Date/Time   CHOL 209 (H) 06/13/2019 1057   TRIG 275.0 (H) 06/13/2019 1057   HDL 35.50 (L) 06/13/2019 1057   CHOLHDL 6 06/13/2019 1057   VLDL 55.0 (H) 06/13/2019 1057   LDLCALC 43 10/14/2016 1429   LDLDIRECT 125.0 06/13/2019 1057    CBC Latest Ref Rng & Units 06/13/2019 04/28/2018 06/24/2017  WBC 4.0 - 10.5 K/uL 7.0 8.3 7.8  Hemoglobin 13.0 - 17.0 g/dL 14.1 15.1 12.0(L)  Hematocrit 39.0 - 52.0 % 41.4 44.4 38.2(L)  Platelets 150.0 - 400.0 K/uL 199.0 189.0 201   LDL goal < 70, TG < 150, HDL > 40 Patient has tried these meds in past: atorvastatin (stomach pain/nausea - stopped 10/2017), simvastatin 20 mg (switched to atorvastatin) Patient is currently uncontrolled on the following medications:   No statin therapy   Aspirin 81 mg - 1 tablet daily  Clopidogrel 75 mg - 1 tablet daily  We discussed: Patient is not interested in additional medications at this time. Indefinite DAPT per cardio due to multiple stents placed, CBC stable, denies abnormal bruising or bleeding. Discussed heart healthy diet/exercise.   Plan: Continue current medications; Focus on heart healthy diet and limiting cholesterol intake. Increase exercise. Consider discussion about statin retrial if dietary changes fail.  Diabetes   Recent Relevant Labs: Lab Results  Component Value Date/Time   HGBA1C 8.0 (H) 06/13/2019 10:57 AM   HGBA1C 8.9 (A) 10/27/2018 12:28 PM   HGBA1C 8.1 (H) 04/28/2018 12:28 PM   MICROALBUR 7.5 (H) 04/28/2018 12:28 PM   MICROALBUR 0.5 02/04/2009 10:09 AM    Checking BG: twice weekly before breakfast  Recent FBG Readings: 180s  A1c goal < 8% Patient has failed these meds in past: metformin - CKD Patient is currently controlled on the following medications:   No  pharmacotherapy  Last diabetic eye exam:  Lab Results  Component Value Date/Time   HMDIABEYEEXA No Retinopathy 02/12/2019 12:00 AM    Last diabetic foot exam:  Lab Results  Component Value Date/Time   HMDIABFOOTEX done 10/27/2018 12:00 AM    We  discussed: Pt is focusing more on dietary changes, however, BG remains about the same. Discussed option of starting oral medication to lower BG but pt declines at this time.  Diet: 3 meals/day - breakfast: shredded wheat cereal, yogurt, chocolate milk, pop-tart  - lunch: vegetables mostly (creamed potatoes, cabbage, turnip salad) - dinner: bowl of soup  Exercise: walking maybe once a week, reports some weight loss in the past year  Plan: Continue control with diet and exercise; Increase walking and limit sugary beverages and snacks (pop-tarts, chocolate milk). Utilize diabetes plate method to limit portions. F/u in 1 month to assess BG control.   Medication Management  Misc: nitroglycerin 0.4 mg SL PRN  OTCs: Ocuvite supplement, MiraLAX PRN  Pharmacy/Benefits: [employer]  Adherence: refills timely   Affordability: denies concerns  CCM Follow Up: 10/12/19 at 12:00 PM (telephone)   [name], PharmD Clinical Pharmacist [facility] at [facility] [phone]

## 2019-09-06 DIAGNOSIS — H353213 Exudative age-related macular degeneration, right eye, with inactive scar: Secondary | ICD-10-CM | POA: Diagnosis not present

## 2019-09-06 DIAGNOSIS — H35372 Puckering of macula, left eye: Secondary | ICD-10-CM | POA: Diagnosis not present

## 2019-09-06 DIAGNOSIS — H43813 Vitreous degeneration, bilateral: Secondary | ICD-10-CM | POA: Diagnosis not present

## 2019-09-06 DIAGNOSIS — H353221 Exudative age-related macular degeneration, left eye, with active choroidal neovascularization: Secondary | ICD-10-CM | POA: Diagnosis not present

## 2019-09-12 NOTE — Patient Instructions (Addendum)
Dear [name],  Below is a summary of the goals we discussed during our follow up appointment on September 05, 2019. Please contact me anytime with questions or concerns.   Visit Information  Goals Addressed            This Visit's Progress   . Pharmacy Care Plan       CARE PLAN ENTRY  Current Barriers:  . Chronic Disease Management support, education, and care coordination needs related to Hypertension, Hyperlipidemia, and Diabetes   Hypertension . Pharmacist Clinical Goal(s): o Over the next 30 days, patient will work with PharmD and providers to achieve BP goal <140/90 mmHg . Current regimen:  o Amlodipine 5 mg - 1 tablet daily (Lori Foster) o Losartan 25 mg - 1 tablet daily (Lori Foster) o Carvedilol 3.125 mg - 1 tablet twice daily o Furosemide 40 mg - 1 tablet on Mon, Wed, Friday . Interventions: o Reviewed home blood pressure readings and left voicemail with nephrologist for further review . Patient self care activities - Over the next 30 days, patient will: o Continue to check blood pressure at least twice weekly in the morning, document, and provide at future appointment - check daily if feasible. o Ensure daily salt intake < 2300 mg/day  Hyperlipidemia . Pharmacist Clinical Goal(s): o Over the next 30 days, patient will work with PharmD and providers to achieve LDL goal < 70 . Current regimen:  o No pharmacotherapy o Previously on: atorvastatin, simvastatin . Interventions: o Discussed dietary changes/exercise goals . Patient self care activities - Over the next 30 days, patient will: o Limit foods high in cholesterol  o Increase walking with goal of 10-15 minutes intervals, 3 days per week  Diabetes . Pharmacist Clinical Goal(s): o Over the next 30 days, patient will work with PharmD and providers to maintain A1c goal <8% . Current regimen:  o No pharmacotherapy . Interventions: o Discussed diet and exercise goals . Patient self care activities - Over the  next 30 days, patient will: o Continue to check blood sugar twice weekly before breakfast, document, and provide at future appointments  o Contact provider with any episodes of hypoglycemia (blood glucose < 70) o Replace high sugar breakfast options (poptarts, chocolate milk) with healthier options including non-fat greek yogurt with fruit, shredded wheat cereal with low fat milk, boiled eggs o Limit portion sizes with the diabetes plate method: 1/2 of your plate should be lean vegetables (cabbage, leafy greens, salad, tomatoes, carrots, or squash), 1/4 of your plate lean protein (grilled chicken, fish, [place], eggs, cheese), and 1/4 of your plate starchy vegetables (whole grain rice or pasta, beans, potatoes, fruits).  Please see past updates related to this goal by clicking on the "Past Updates" button in the selected goal       The patient verbalized understanding of instructions provided today and agreed to receive a mailed copy of patient instruction and/or educational materials. Telephone follow up appointment with pharmacy team member scheduled for: 10/12/19 at 12:00 PM (telephone)   [name], PharmD Clinical Pharmacist [facility] at [facility] [phone] Diabetes Mellitus and Nutrition, Adult When you have diabetes (diabetes mellitus), it is very important to have healthy eating habits because your blood sugar (glucose) levels are greatly affected by what you eat and drink. Eating healthy foods in the appropriate amounts, at about the same times every day, can help you:  Control your blood glucose.  Lower your risk of heart disease.  Improve your blood  pressure.  Reach or maintain a healthy weight. Every person with diabetes is different, and each person has different needs for a meal plan. Your health care provider may recommend that you work with a diet and nutrition specialist (dietitian) to make a meal plan that is best for you. Your meal plan may vary  depending on factors such as:  The calories you need.  The medicines you take.  Your weight.  Your blood glucose, blood pressure, and cholesterol levels.  Your activity level.  Other health conditions you have, such as heart or kidney disease. How do carbohydrates affect me? Carbohydrates, also called carbs, affect your blood glucose level more than any other type of food. Eating carbs naturally raises the amount of glucose in your blood. Carb counting is a method for keeping track of how many carbs you eat. Counting carbs is important to keep your blood glucose at a healthy level, especially if you use insulin or take certain oral diabetes medicines. It is important to know how many carbs you can safely have in each meal. This is different for every person. Your dietitian can help you calculate how many carbs you should have at each meal and for each snack. Foods that contain carbs include:  Bread, cereal, rice, pasta, and crackers.  Potatoes and corn.  Peas, beans, and lentils.  Milk and yogurt.  Fruit and juice.  Desserts, such as cakes, cookies, ice cream, and candy. How does alcohol affect me? Alcohol can cause a sudden decrease in blood glucose (hypoglycemia), especially if you use insulin or take certain oral diabetes medicines. Hypoglycemia can be a life-threatening condition. Symptoms of hypoglycemia (sleepiness, dizziness, and confusion) are similar to symptoms of having too much alcohol. If your health care provider says that alcohol is safe for you, follow these guidelines:  Limit alcohol intake to no more than 1 drink per day for nonpregnant women and 2 drinks per day for men. One drink equals 12 oz of beer, 5 oz of wine, or 1 oz of hard liquor.  Do not drink on an empty stomach.  Keep yourself hydrated with water, diet soda, or unsweetened iced tea.  Keep in mind that regular soda, juice, and other mixers may contain a lot of sugar and must be counted as  carbs. What are tips for following this plan?  Reading food labels  Start by checking the serving size on the "Nutrition Facts" label of packaged foods and drinks. The amount of calories, carbs, fats, and other nutrients listed on the label is based on one serving of the item. Many items contain more than one serving per package.  Check the total grams (g) of carbs in one serving. You can calculate the number of servings of carbs in one serving by dividing the total carbs by 15. For example, if a food has 30 g of total carbs, it would be equal to 2 servings of carbs.  Check the number of grams (g) of saturated and trans fats in one serving. Choose foods that have low or no amount of these fats.  Check the number of milligrams (mg) of salt (sodium) in one serving. Most people should limit total sodium intake to less than 2,300 mg per day.  Always check the nutrition information of foods labeled as "low-fat" or "nonfat". These foods may be higher in added sugar or refined carbs and should be avoided.  Talk to your dietitian to identify your daily goals for nutrients listed on the label. Shopping  Avoid buying canned, premade, or processed foods. These foods tend to be high in fat, sodium, and added sugar.  Shop around the outside edge of the grocery store. This includes fresh fruits and vegetables, bulk grains, fresh meats, and fresh dairy. Cooking  Use low-heat cooking methods, such as baking, instead of high-heat cooking methods like deep frying.  Cook using healthy oils, such as olive, canola, or sunflower oil.  Avoid cooking with butter, cream, or high-fat meats. Meal planning  Eat meals and snacks regularly, preferably at the same times every day. Avoid going long periods of time without eating.  Eat foods high in fiber, such as fresh fruits, vegetables, beans, and whole grains. Talk to your dietitian about how many servings of carbs you can eat at each meal.  Eat 4-6 ounces (oz)  of lean protein each day, such as lean meat, chicken, fish, eggs, or tofu. One oz of lean protein is equal to: ? 1 oz of meat, chicken, or fish. ? 1 egg. ?  cup of tofu.  Eat some foods each day that contain healthy fats, such as avocado, nuts, seeds, and fish. Lifestyle  Check your blood glucose regularly.  Exercise regularly as told by your health care provider. This may include: ? 150 minutes of moderate-intensity or vigorous-intensity exercise each week. This could be brisk walking, biking, or water aerobics. ? Stretching and doing strength exercises, such as yoga or weightlifting, at least 2 times a week.  Take medicines as told by your health care provider.  Do not use any products that contain nicotine or tobacco, such as cigarettes and e-cigarettes. If you need help quitting, ask your health care provider.  Work with a Social worker or diabetes educator to identify strategies to manage stress and any emotional and social challenges. Questions to ask a health care provider  Do I need to meet with a diabetes educator?  Do I need to meet with a dietitian?  What number can I call if I have questions?  When are the best times to check my blood glucose? Where to find more information:  American Diabetes Association: diabetes.org  Academy of Nutrition and Dietetics: www.eatright.[employer] of Diabetes and Digestive and Kidney Diseases (NIH): [employer] Summary  A healthy meal plan will help you control your blood glucose and maintain a healthy lifestyle.  Working with a diet and nutrition specialist (dietitian) can help you make a meal plan that is best for you.  Keep in mind that carbohydrates (carbs) and alcohol have immediate effects on your blood glucose levels. It is important to count carbs and to use alcohol carefully. This information is not intended to replace advice given to you by your health care provider. Make sure you discuss any questions you  have with your health care provider. Document Revised: 03/04/2017 Document Reviewed: 04/26/2016 Elsevier Patient Education  2020 [employer].

## 2019-09-18 ENCOUNTER — Encounter: Payer: Self-pay | Admitting: Student in an Organized Health Care Education/Training Program

## 2019-09-18 ENCOUNTER — Other Ambulatory Visit: Payer: Self-pay

## 2019-09-18 ENCOUNTER — Ambulatory Visit
Payer: Medicare Other | Attending: Student in an Organized Health Care Education/Training Program | Admitting: Student in an Organized Health Care Education/Training Program

## 2019-09-18 VITALS — BP 149/76 | HR 73 | Temp 98.7°F | Resp 18 | Ht 72.0 in | Wt 192.0 lb

## 2019-09-18 DIAGNOSIS — N183 Chronic kidney disease, stage 3 unspecified: Secondary | ICD-10-CM | POA: Insufficient documentation

## 2019-09-18 DIAGNOSIS — M792 Neuralgia and neuritis, unspecified: Secondary | ICD-10-CM | POA: Diagnosis not present

## 2019-09-18 DIAGNOSIS — E1142 Type 2 diabetes mellitus with diabetic polyneuropathy: Secondary | ICD-10-CM | POA: Diagnosis not present

## 2019-09-18 DIAGNOSIS — M47816 Spondylosis without myelopathy or radiculopathy, lumbar region: Secondary | ICD-10-CM | POA: Diagnosis not present

## 2019-09-18 DIAGNOSIS — M79604 Pain in right leg: Secondary | ICD-10-CM | POA: Insufficient documentation

## 2019-09-18 DIAGNOSIS — M059 Rheumatoid arthritis with rheumatoid factor, unspecified: Secondary | ICD-10-CM | POA: Diagnosis not present

## 2019-09-18 DIAGNOSIS — E1122 Type 2 diabetes mellitus with diabetic chronic kidney disease: Secondary | ICD-10-CM | POA: Diagnosis not present

## 2019-09-18 DIAGNOSIS — Z79891 Long term (current) use of opiate analgesic: Secondary | ICD-10-CM | POA: Insufficient documentation

## 2019-09-18 DIAGNOSIS — M79605 Pain in left leg: Secondary | ICD-10-CM | POA: Diagnosis not present

## 2019-09-18 DIAGNOSIS — G629 Polyneuropathy, unspecified: Secondary | ICD-10-CM | POA: Insufficient documentation

## 2019-09-18 DIAGNOSIS — N184 Chronic kidney disease, stage 4 (severe): Secondary | ICD-10-CM | POA: Diagnosis not present

## 2019-09-18 DIAGNOSIS — R809 Proteinuria, unspecified: Secondary | ICD-10-CM | POA: Diagnosis not present

## 2019-09-18 DIAGNOSIS — G8929 Other chronic pain: Secondary | ICD-10-CM | POA: Insufficient documentation

## 2019-09-18 DIAGNOSIS — G894 Chronic pain syndrome: Secondary | ICD-10-CM | POA: Diagnosis not present

## 2019-09-18 DIAGNOSIS — I129 Hypertensive chronic kidney disease with stage 1 through stage 4 chronic kidney disease, or unspecified chronic kidney disease: Secondary | ICD-10-CM | POA: Diagnosis not present

## 2019-09-18 MED ORDER — HYDROCODONE BITARTRATE ER 10 MG PO CP12: 10.0000 mg | ORAL_CAPSULE | Freq: Two times a day (BID) | ORAL | 0 refills | Status: AC

## 2019-09-18 MED ORDER — HYDROCODONE BITARTRATE ER 10 MG PO CP12: 10.0000 mg | ORAL_CAPSULE | Freq: Two times a day (BID) | ORAL | 0 refills | Status: DC

## 2019-09-18 MED ORDER — HYDROCODONE-ACETAMINOPHEN 10-325 MG PO TABS: 1.0000 | ORAL_TABLET | ORAL | 0 refills | Status: DC | PRN

## 2019-09-18 MED ORDER — HYDROCODONE-ACETAMINOPHEN 10-325 MG PO TABS: 1.0000 | ORAL_TABLET | ORAL | 0 refills | Status: AC | PRN

## 2019-09-18 NOTE — Progress Notes (Signed)
PROVIDER NOTE: Information contained herein reflects review and annotations entered in association with encounter. Interpretation of such information and data should be left to medically-trained personnel. Information provided to patient can be located elsewhere in the medical record under "Patient Instructions". Document created using STT-dictation technology, any transcriptional errors that may result from process are unintentional.    Patient: [name]  Service Category: E/M  Provider: Gillis Santa, MD  DOB: [birth year]  DOS: 09/18/2019  Specialty: Interventional Pain Management  MRN: [id number]  Setting: Ambulatory outpatient  PCP: [name], MD  Type: Established Patient    Referring Provider: [name], MD  Location: Office  Delivery: Face-to-face     HPI  Reason for encounter: [name], a 86 y.o. year old male, is here today for evaluation and management of his Neuropathic pain [M79.2]. [name] primary complain today is Medication Refill Last encounter: Practice (06/14/2019). My last encounter with him was on 06/14/2019. Pertinent problems: [name] has CKD (chronic kidney disease) stage 4, GFR 15-29 ml/min ([place]); Spinal stenosis of lumbar region without neurogenic claudication; Spondylosis without myelopathy or radiculopathy, lumbar region; Chronic pain of both lower extremities; Neuropathic pain; Chronic pain syndrome; and CKD stage 3 due to type 2 diabetes mellitus ([name]) on their pertinent problem list. Pain Assessment: Severity of Chronic pain is reported as a 5 /10. Location: Foot Right, Left/Denies. Onset: More than a month ago. Quality: Sharp, Tightness. Timing: Intermittent. Modifying factor(s): Hydrocodone. Vitals:  height is 6' (1.829 m) and weight is 192 lb (87.1 kg). His temperature is 98.7 F (37.1 C). His blood pressure is 149/76 (abnormal) and his pulse is 73. His respiration is 18 and oxygen saturation is 97%.   Patient presents today for  medication management.  He states that his hydrocodone is helpful in managing his pain although he is requesting a dose in the afternoon.  Patient takes 30 mg of hydrocodone in the morning, 20 mg in the evening; total daily dose equals 50 mg.  He states that around 3 PM he starts to have increasing low back pain.  He states that when he does take the medication, it helps to reduce his pain by approximately 50%.  Of note, he did see Dr. Ernestina Patches with physical medicine and rehab.  Patient had completed lumbar radiofrequency ablation of his lower facet joints with good relief in 2017.  Patient is now endorsing return of his axial low back pain.  He states that his radiofrequency ablation provided him with approximately 70% pain relief in regards to his low back pain for a year.  Given that has been more than 2 years since his last RFA, recommend lumbar facet medial branch nerve block at L3, L4, L5.  Depending upon results, can consider lumbar radiofrequency ablation.  Patient is on Plavix so we will need to reach out to his cardiologist to get approval to stop for 7 days prior to his spinal procedure.  In regards to his chronic pain regimen, recommend adding a long-acting hydrocodone ER.  Continue short acting hydrocodone as it is.  Pharmacotherapy Assessment   Analgesic: 08/14/2019  1   06/14/2019  Hydrocodone-Acetamin 10-325 MG  150.00  30 Bi Lat   [id number]   Gib (4800)   0  50.00 MME  Medicare   [name]     Monitoring: [place] PMP: PDMP reviewed during this encounter.       Pharmacotherapy: No side-effects or adverse reactions reported. Compliance: No problems identified. Effectiveness: Clinically acceptable.  UDS:  Summary  Date Value Ref Range Status  12/19/2018 Note  Final    Comment:    ==================================================================== [employer] Select 13 (MW) ==================================================================== Test                             Result       Flag        Units Drug Present and Declared for Prescription Verification   Hydrocodone                    3037         EXPECTED   ng/mg creat   Hydromorphone                  2885         EXPECTED   ng/mg creat   Dihydrocodeine                 545          EXPECTED   ng/mg creat   Norhydrocodone                 2240         EXPECTED   ng/mg creat    Sources of hydrocodone include scheduled prescription medications.    Hydromorphone, dihydrocodeine and norhydrocodone are expected    metabolites of hydrocodone. Hydromorphone and dihydrocodeine are    also available as scheduled prescription medications. ==================================================================== Test                      Result    Flag   Units      Ref Range   Creatinine              73               mg/dL      >=20 ==================================================================== Declared Medications:  The flagging and interpretation on this report are based on the  following declared medications.  Unexpected results may arise from  inaccuracies in the declared medications.  **Note: The testing scope of this panel includes these medications:  Hydrocodone  **Note: The testing scope of this panel does not include the  following reported medications:  Acetaminophen  Aspirin  Carvedilol  Cholecalciferol  Clopidogrel (Plavix)  Furosemide  Lutein  Nitroglycerin (Nitrostat)  Omega-3 Fatty Acids  Oxygen  Pantoprazole (Protonix)  Polyethylene Glycol (MiraLAX)  Vitamin C ==================================================================== For clinical consultation, please call [phone]. ====================================================================       ROS  Constitutional: Denies any fever or chills Gastrointestinal: No reported hemesis, hematochezia, vomiting, or acute GI distress Musculoskeletal: Denies any acute onset joint swelling, redness, loss of ROM, or weakness Neurological: No reported  episodes of acute onset apraxia, aphasia, dysarthria, agnosia, amnesia, paralysis, loss of coordination, or loss of consciousness  Medication Review  DHA-Vitamin C-Lutein, HYDROcodone Bitartrate ER, HYDROcodone-acetaminophen, ONE TOUCH ULTRA 2, OneTouch Delica Lancets 06T, Oxygen-Helium, amLODipine, aspirin EC, carvedilol, clopidogrel, furosemide, glucose blood, losartan, nitroGLYCERIN, ondansetron, pantoprazole, polyethylene glycol, and tamsulosin  History Review  Allergy: Mr. Early is allergic to doxazosin mesylate and methocarbamol. Drug: Mr. Forget  reports no history of drug use. Alcohol:  reports no history of alcohol use. Tobacco:  reports that he quit smoking about 56 years ago. His smoking use included cigarettes. He has a 20.00 pack-year smoking history. He has never used smokeless tobacco. Social: Mr. Hughlett  reports that he quit smoking about 56 years ago.  His smoking use included cigarettes. He has a 20.00 pack-year smoking history. He has never used smokeless tobacco. He reports that he does not drink alcohol and does not use drugs. Medical:  has a past medical history of Arthritis, CAD (coronary artery disease), Cataract, Chronic diastolic CHF (congestive heart failure) ([place]), Chronic kidney disease, stage III (moderate), Chronic lower back pain, Colon polyps, COVID-19, Diverticulosis, Dyspnea (2009 since July -Sept), Enlarged prostate, Esophageal stricture, GERD (gastroesophageal reflux disease), Heart murmur, Hiatal hernia, History of carpal tunnel syndrome, History of kidney stones, History of PFTs, Hyperlipidemia, Hypertension, Interstitial lung disease ([place]), Iron deficiency anemia, Nausea & vomiting, On home oxygen therapy, Osteoporosis, Overweight (BMI 25.0-29.9), Peripheral neuropathy, RA (rheumatoid arthritis) ([place]), Seropositive rheumatoid arthritis ([place]), Type II diabetes mellitus ([place]), Wears glasses, and Wears partial dentures. Surgical: Mr. Teo  has a past surgical  history that includes Total knee arthroplasty (Right, 03/2010); Knee arthroscopy (Right, 2008); Cataract extraction w/ intraocular lens  implant, bilateral (Bilateral); Coronary artery bypass graft (11/1999); Coronary stent placement (02/2012); Shoulder arthroscopy with open rotator cuff repair and distal clavicle acrominectomy (Left, 02/27/2013); Trigger finger release (Left, 02/27/2013); Cholecystectomy open (11/2003); Joint replacement; Esophagogastroduodenoscopy (egd) with esophageal dilation (2010); Cardiac catheterization (08/2004); Cardiac catheterization (12/31/2011); Cardiac catheterization (2009); left and right heart catheterization with coronary angiogram (12/31/2011); percutaneous coronary stent intervention (pci-s) (12/31/2011); percutaneous coronary stent intervention (pci-s) (N/A, 01/03/2012); Percutaneous coronary intervention-balloon only (01/03/2012); left and right heart catheterization with coronary angiogram (N/A, 01/14/2014); Hand surgery; Cardiac catheterization (N/A, 08/13/2015); Esophagogastroduodenoscopy (N/A, 03/01/2017); maloney dilation (03/01/2017); Esophagogastroduodenoscopy (egd) with propofol (N/A, 09/12/2018); Botox injection (N/A, 09/12/2018); Balloon dilation (N/A, 09/12/2018); Coronary angioplasty with stent (01/03/2012); Coronary angioplasty with stent (01/14/2014); Esophagogastroduodenoscopy (egd) with propofol (N/A, 11/27/2018); Botox injection (N/A, 11/27/2018); and Esophageal dilation (11/27/2018). Family: family history includes Alcohol abuse in his sister; COPD in his mother; Colon cancer (age of onset: 55) in his brother; Diabetes in his brother; Heart attack in his father; Heart disease in his father; Stomach cancer in his brother; Stroke in his sister.  Laboratory Chemistry Profile   Renal Lab Results  Component Value Date   BUN 33 (H) 06/13/2019   CREATININE 2.06 (H) 06/13/2019   BCR 13 06/24/2017   GFR 30.78 (L) 06/13/2019   GFRAA 30 (L) 06/24/2017   GFRNONAA 26 (L)  06/24/2017     Hepatic Lab Results  Component Value Date   AST 14 06/13/2019   ALT 9 06/13/2019   ALBUMIN 4.1 06/13/2019   ALBUMIN 4.1 06/13/2019   ALKPHOS 58 06/13/2019     Electrolytes Lab Results  Component Value Date   NA 133 (L) 06/13/2019   K 4.6 06/13/2019   CL 96 06/13/2019   CALCIUM 9.9 06/13/2019   MG 1.9 04/08/2017   PHOS 2.9 06/13/2019     Bone No results found for: VD25OH, VD125OH2TOT, [id number], WL8937DS2, 25OHVITD1, 25OHVITD2, 25OHVITD3, TESTOFREE, TESTOSTERONE   Inflammation (CRP: Acute Phase) (ESR: Chronic Phase) Lab Results  Component Value Date   ESRSEDRATE 38 (H) 11/22/2011       Note: Above Lab results reviewed.  Recent Imaging Review  DG Chest Portable 1 View CLINICAL DATA:  Shortness of breath and fevers, COVID-19 positivity  EXAM: PORTABLE CHEST 1 VIEW  COMPARISON:  02/06/2019  FINDINGS: Cardiac shadows within normal limits. Postsurgical changes are seen. The lungs are well aerated bilaterally without focal infiltrate or sizable effusion. No acute bony abnormality is seen.  IMPRESSION: No active disease.  Electronically Signed   By: Inez Catalina M.D.   On: 04/03/2019  13:06 Note: Reviewed        Physical Exam  General appearance: Well nourished, well developed, and well hydrated. In no apparent acute distress Mental status: Alert, oriented x 3 (person, place, & time)       Respiratory: No evidence of acute respiratory distress Eyes: PERLA Vitals: BP (!) 149/76   Pulse 73   Temp 98.7 F (37.1 C)   Resp 18   Ht 6' (1.829 m)   Wt 192 lb (87.1 kg)   SpO2 97%   BMI 26.04 kg/m  BMI: Estimated body mass index is 26.04 kg/m as calculated from the following:   Height as of this encounter: 6' (1.829 m).   Weight as of this encounter: 192 lb (87.1 kg). Ideal: Ideal body weight: 77.6 kg (171 lb 1.2 oz) Adjusted ideal body weight: 81.4 kg (179 lb 7.1 oz)  Lumbar Spine Area Exam  Skin & Axial Inspection: No masses, redness, or  swelling Alignment: Symmetrical Functional ROM: Decreased ROM       Stability: No instability detected Muscle Tone/Strength: Functionally intact. No obvious neuro-muscular anomalies detected. Sensory (Neurological): Musculoskeletal pain pattern Palpation: No palpable anomalies       Provocative Tests: Hyperextension/rotation test: (+) bilaterally for facet joint pain. Lumbar quadrant test (Kemp's test): (+) bilaterally for facet joint pain.  Gait & Posture Assessment  Ambulation: Unassisted Gait: Relatively normal for age and body habitus Posture: WNL  Lower Extremity Exam    Side: Right lower extremity  Side: Left lower extremity  Stability: No instability observed          Stability: No instability observed          Skin & Extremity Inspection: Skin color, temperature, and hair growth are WNL. No peripheral edema or cyanosis. No masses, redness, swelling, asymmetry, or associated skin lesions. No contractures.  Skin & Extremity Inspection: Skin color, temperature, and hair growth are WNL. No peripheral edema or cyanosis. No masses, redness, swelling, asymmetry, or associated skin lesions. No contractures.  Functional ROM: Decreased ROM                  Functional ROM: Decreased ROM                  Muscle Tone/Strength: Functionally intact. No obvious neuro-muscular anomalies detected.  Muscle Tone/Strength: Functionally intact. No obvious neuro-muscular anomalies detected.  Sensory (Neurological): Neuropathic pain pattern        Sensory (Neurological): Neuropathic pain pattern        DTR: Patellar: deferred today Achilles: deferred today Plantar: deferred today  DTR: Patellar: deferred today Achilles: deferred today Plantar: deferred today  Palpation: No palpable anomalies  Palpation: No palpable anomalies    Assessment   Status Diagnosis  Having a Flare-up Having a Flare-up Having a Flare-up 1. Neuropathic pain   2. Lumbar facet arthropathy   3. Lumbar spondylosis   4.  Chronic pain of both lower extremities   5. Long term prescription opiate use   6. Diabetic polyneuropathy associated with type 2 diabetes mellitus ([place])   7. Seropositive rheumatoid arthritis ([place])   8. CKD stage 3 due to type 2 diabetes mellitus ([place])   9. Neuropathy   10. Chronic pain syndrome      Updated Problems: Problem  Ckd Stage 3 Due to Type 2 Diabetes Mellitus ([name])  Chronic Pain of Both Lower Extremities  Neuropathic Pain  Chronic Pain Syndrome  Ckd (Chronic Kidney Disease) Stage 4, Gfr 15-29 Ml/Min ([name])   Overview:  Last Assessment & Plan:  Recent GFR down a little   Spinal Stenosis of Lumbar Region Without Neurogenic Claudication  Spondylosis Without Myelopathy Or Radiculopathy, Lumbar Region    Plan of Care   1.  Chronic pain syndrome: Continue short acting hydrocodone as prescribed, maximum 5 tablets a day.  Add extended release hydrocodone bitartrate as below, 10 mg twice daily.  2.  Lumbar facet arthropathy, lumbar spondylosis: Clinical exam consistent with facet agenic pain as the patient has increased pain with facet loading.  Is status post lumbar radiofrequency ablation in 2017 with Dr. Ernestina Patches for lumbar facet arthropathy which provided 70% pain relief for approximately 1 year.  Given return of his axial low back pain that is decreasing his functional status, discussed repeating lumbar facet medial branch nerve blocks and then possibly proceeding with lumbar radiofrequency ablation.  Patient is on Plavix so will need cardiac clearance to stop for 7 days prior to scheduled procedure.  Pharmacotherapy (Medications Ordered): Meds ordered this encounter  Medications  . HYDROcodone Bitartrate ER 10 MG CP12    Sig: Take 10 mg by mouth every 12 (twelve) hours. Must last 30 days.    Dispense:  60 capsule    Refill:  0    Chronic Pain. (STOP Act - Not applicable). Fill one day early if closed on scheduled refill date.  [name] HYDROcodone Bitartrate ER 10 MG CP12     Sig: Take 10 mg by mouth every 12 (twelve) hours. Must last 30 days.    Dispense:  60 capsule    Refill:  0    Chronic Pain. (STOP Act - Not applicable). Fill one day early if closed on scheduled refill date.  [name] HYDROcodone-acetaminophen (NORCO) 10-325 MG tablet    Sig: Take 1 tablet by mouth every 4 (four) hours as needed for severe pain. Must last 30 days.    Dispense:  150 tablet    Refill:  0    Chronic Pain. (STOP Act - Not applicable). Fill one day early if closed on scheduled refill date.  [name] HYDROcodone-acetaminophen (NORCO) 10-325 MG tablet    Sig: Take 1 tablet by mouth every 4 (four) hours as needed for severe pain. Must last 30 days.    Dispense:  150 tablet    Refill:  0    Chronic Pain. (STOP Act - Not applicable). Fill one day early if closed on scheduled refill date.   Orders:  Orders Placed This Encounter  Procedures  . LUMBAR FACET(MEDIAL BRANCH NERVE BLOCK) MBNB    Standing Status:   Future    Standing Expiration Date:   10/18/2019    Scheduling Instructions:     Procedure: Lumbar facet block (AKA.: Lumbosacral medial branch nerve block)     Side: Bilateral     Level: L3-4, L4-5,  Facets (L3, L4, L5,  Medial Branch Nerves)     Sedation: without     Timeframe: ASAA          Cardiac clearance to stop Plavix 7 days prior    Order Specific Question:   Where will this procedure be performed?    Answer:   [facility]   Follow-up plan:   Return in about 2 weeks (around 10/02/2019) for L3-L5 Fcts with sedation (stop Plavix 7 days prior).   Recent Visits No visits were found meeting these conditions. Showing recent visits within past 90 days and meeting all other requirements Today's Visits Date Type Provider Dept  09/18/19 Office Visit Gillis Santa,  MD [facility]  Showing today's visits and meeting all other requirements Future Appointments No visits were found meeting these conditions. Showing future appointments within next 90 days and meeting  all other requirements  I discussed the assessment and treatment plan with the patient. The patient was provided an opportunity to ask questions and all were answered. The patient agreed with the plan and demonstrated an understanding of the instructions.  Patient advised to call back or seek an in-person evaluation if the symptoms or condition worsens.  Duration of encounter: 6mnutes.  Note by: BGillis Santa MD Date: 09/18/2019; Time: 11:25 AM

## 2019-09-18 NOTE — Addendum Note (Signed)
Addended by: Gillis Santa on: 09/18/2019 02:16 PM   Modules accepted: Orders

## 2019-09-18 NOTE — Progress Notes (Signed)
Safety precautions to be maintained throughout the outpatient stay will include: orient to surroundings, keep bed in low position, maintain call bell within reach at all times, provide assistance with transfer out of bed and ambulation.   Nursing Pain Medication Assessment:  Safety precautions to be maintained throughout the outpatient stay will include: orient to surroundings, keep bed in low position, maintain call bell within reach at all times, provide assistance with transfer out of bed and ambulation.  Medication Inspection Compliance: Pill count conducted under aseptic conditions, in front of the patient. Neither the pills nor the bottle was removed from the patient's sight at any time. Once count was completed pills were immediately returned to the patient in their original bottle.  Medication: Hydrocodone/APAP Pill/Patch Count: 6 of 150 pills remain Pill/Patch Appearance: Markings consistent with prescribed medication Bottle Appearance: Standard pharmacy container. Clearly labeled. Filled Date: 04 / 12 / 2021 Last Medication intake:  Has not run out yet

## 2019-09-18 NOTE — Patient Instructions (Addendum)
Please get Cardiac clearance from Cardiologist to stop Plavix 7 days prior  ____________________________________________________________________________________________  Preparing for Procedure with Sedation  Procedure appointments are limited to planned procedures: . No Prescription Refills. . No disability issues will be discussed. . No medication changes will be discussed.  Instructions: . Oral Intake: Do not eat or drink anything for at least 8 hours prior to your procedure. (Exception: Blood Pressure Medication. See below.) . Transportation: Unless otherwise stated by your physician, you may drive yourself after the procedure. . Blood Pressure Medicine: Do not forget to take your blood pressure medicine with a sip of water the morning of the procedure. If your Diastolic (lower reading)is above 100 mmHg, elective cases will be cancelled/rescheduled. . Blood thinners: These will need to be stopped for procedures. Notify our staff if you are taking any blood thinners. Depending on which one you take, there will be specific instructions on how and when to stop it. . Diabetics on insulin: Notify the staff so that you can be scheduled 1st case in the morning. If your diabetes requires high dose insulin, take only  of your normal insulin dose the morning of the procedure and notify the staff that you have done so. . Preventing infections: Shower with an antibacterial soap the morning of your procedure. . Build-up your immune system: Take 1000 mg of Vitamin C with every meal (3 times a day) the day prior to your procedure. [name] Antibiotics: Inform the staff if you have a condition or reason that requires you to take antibiotics before dental procedures. . Pregnancy: If you are pregnant, call and cancel the procedure. . Sickness: If you have a cold, fever, or any active infections, call and cancel the procedure. . Arrival: You must be in the facility at least 30 minutes prior to your scheduled  procedure. . Children: Do not bring children with you. . Dress appropriately: Bring dark clothing that you would not mind if they get stained. . Valuables: Do not bring any jewelry or valuables.  Reasons to call and reschedule or cancel your procedure: (Following these recommendations will minimize the risk of a serious complication.) . Surgeries: Avoid having procedures within 2 weeks of any surgery. (Avoid for 2 weeks before or after any surgery). . Flu Shots: Avoid having procedures within 2 weeks of a flu shots or . (Avoid for 2 weeks before or after immunizations). . Barium: Avoid having a procedure within 7-10 days after having had a radiological study involving the use of radiological contrast. (Myelograms, Barium swallow or enema study). . Heart attacks: Avoid any elective procedures or surgeries for the initial 6 months after a "Myocardial Infarction" (Heart Attack). . Blood thinners: It is imperative that you stop these medications before procedures. Let us know if you if you take any blood thinner.  . Infection: Avoid procedures during or within two weeks of an infection (including chest colds or gastrointestinal problems). Symptoms associated with infections include: Localized redness, fever, chills, night sweats or profuse sweating, burning sensation when voiding, cough, congestion, stuffiness, runny nose, sore throat, diarrhea, nausea, vomiting, cold or Flu symptoms, recent or current infections. It is specially important if the infection is over the area that we intend to treat. [name] Heart and lung problems: Symptoms that may suggest an active cardiopulmonary problem include: cough, chest pain, breathing difficulties or shortness of breath, dizziness, ankle swelling, uncontrolled high or unusually low blood pressure, and/or palpitations. If you are experiencing any of these symptoms, cancel your procedure and contact your  primary care physician for an evaluation.  Remember:  Regular  Business hours are:  Monday to Thursday 8:00 AM to 4:00 PM  Provider's Schedule: Milinda Pointer, MD:  Procedure days: Tuesday and Thursday 7:30 AM to 4:00 PM  Gillis Santa, MD:  Procedure days: Monday and Wednesday 7:30 AM to 4:00 PM ____________________________________________________________________________________________  Facet Blocks Patient Information  Description: The facets are joints in the spine between the vertebrae.  Like any joints in the body, facets can become irritated and painful.  Arthritis can also effect the facets.  By injecting steroids and local anesthetic in and around these joints, we can temporarily block the nerve supply to them.  Steroids act directly on irritated nerves and tissues to reduce selling and inflammation which often leads to decreased pain.  Facet blocks may be done anywhere along the spine from the neck to the low back depending upon the location of your pain.   After numbing the skin with local anesthetic (like Novocaine), a small needle is passed onto the facet joints under x-ray guidance.  You may experience a sensation of pressure while this is being done.  The entire block usually lasts about 15-25 minutes.   Conditions which may be treated by facet blocks:   Low back/buttock pain  Neck/shoulder pain  Certain types of headaches  Preparation for the injection:  1. Do not eat any solid food or dairy products within 8 hours of your appointment. 2. You may drink clear liquid up to 3 hours before appointment.  Clear liquids include water, black coffee, juice or soda.  No milk or cream please. 3. You may take your regular medication, including pain medications, with a sip of water before your appointment.  Diabetics should hold regular insulin (if taken separately) and take 1/2 normal NPH dose the morning of the procedure.  Carry some sugar containing items with you to your appointment. 4. A driver must accompany you and be prepared to drive  you home after your procedure. 5. Bring all your current medications with you. 6. An IV may be inserted and sedation may be given at the discretion of the physician. 7. A blood pressure cuff, EKG and other monitors will often be applied during the procedure.  Some patients may need to have extra oxygen administered for a short period. 8. You will be asked to provide medical information, including your allergies and medications, prior to the procedure.  We must know immediately if you are taking blood thinners (like Coumadin/Warfarin) or if you are allergic to IV iodine contrast (dye).  We must know if you could possible be pregnant.  Possible side-effects:   Bleeding from needle site  Infection (rare, may require surgery)  Nerve injury (rare)  Numbness & tingling (temporary)  Difficulty urinating (rare, temporary)  Spinal headache (a headache worse with upright posture)  Light-headedness (temporary)  Pain at injection site (serveral days)  Decreased blood pressure (rare, temporary)  Weakness in arm/leg (temporary)  Pressure sensation in back/neck (temporary)   Call if you experience:   Fever/chills associated with headache or increased back/neck pain  Headache worsened by an upright position  New onset, weakness or numbness of an extremity below the injection site  Hives or difficulty breathing (go to the emergency room)  Inflammation or drainage at the injection site(s)  Severe back/neck pain greater than usual  New symptoms which are concerning to you  Please note:  Although the local anesthetic injected can often make your back or neck feel good  for several hours after the injection, the pain will likely return. It takes 3-7 days for steroids to work.  You may not notice any pain relief for at least one week.  If effective, we will often do a series of 2-3 injections spaced 3-6 weeks apart to maximally decrease your pain.  After the initial series, you may be a  candidate for a more permanent nerve block of the facets.  If you have any questions, please call #[phone]) [facility]

## 2019-09-22 LAB — TOXASSURE SELECT 13 (MW), URINE

## 2019-10-02 ENCOUNTER — Ambulatory Visit: Payer: Medicare Other | Admitting: Pain Medicine

## 2019-10-03 ENCOUNTER — Ambulatory Visit (HOSPITAL_BASED_OUTPATIENT_CLINIC_OR_DEPARTMENT_OTHER): Payer: Medicare Other | Admitting: Student in an Organized Health Care Education/Training Program

## 2019-10-03 ENCOUNTER — Ambulatory Visit
Admission: RE | Admit: 2019-10-03 | Discharge: 2019-10-03 | Disposition: A | Discharge status: Home / Self Care | Payer: Medicare Other | Source: Ambulatory Visit | Attending: Student in an Organized Health Care Education/Training Program | Admitting: Student in an Organized Health Care Education/Training Program

## 2019-10-03 ENCOUNTER — Encounter: Payer: Self-pay | Admitting: Student in an Organized Health Care Education/Training Program

## 2019-10-03 ENCOUNTER — Other Ambulatory Visit: Payer: Self-pay

## 2019-10-03 VITALS — BP 184/65 | HR 70 | Temp 98.2°F | Resp 20 | Ht 72.0 in | Wt 194.0 lb

## 2019-10-03 DIAGNOSIS — G894 Chronic pain syndrome: Secondary | ICD-10-CM | POA: Insufficient documentation

## 2019-10-03 DIAGNOSIS — M47816 Spondylosis without myelopathy or radiculopathy, lumbar region: Secondary | ICD-10-CM | POA: Insufficient documentation

## 2019-10-03 MED ORDER — ROPIVACAINE HCL 2 MG/ML IJ SOLN
9.0000 mL | Freq: Once | INTRAMUSCULAR | Status: AC
  Administered 2019-10-03: 10 mL via PERINEURAL
  Filled 2019-10-03: qty 10

## 2019-10-03 MED ORDER — ROPIVACAINE HCL 2 MG/ML IJ SOLN
9.0000 mL | Freq: Once | INTRAMUSCULAR | Status: DC
  Filled 2019-10-03: qty 10

## 2019-10-03 MED ORDER — DEXAMETHASONE SODIUM PHOSPHATE 10 MG/ML IJ SOLN
10.0000 mg | Freq: Once | INTRAMUSCULAR | Status: DC
  Filled 2019-10-03: qty 1

## 2019-10-03 MED ORDER — LIDOCAINE HCL 2 % IJ SOLN
20.0000 mL | Freq: Once | INTRAMUSCULAR | Status: AC
  Administered 2019-10-03: 200 mg
  Filled 2019-10-03: qty 10

## 2019-10-03 MED ORDER — DEXAMETHASONE SODIUM PHOSPHATE 10 MG/ML IJ SOLN
10.0000 mg | Freq: Once | INTRAMUSCULAR | Status: AC
  Administered 2019-10-03: 10 mg
  Filled 2019-10-03: qty 1

## 2019-10-03 NOTE — Progress Notes (Signed)
Safety precautions to be maintained throughout the outpatient stay will include: orient to surroundings, keep bed in low position, maintain call bell within reach at all times, provide assistance with transfer out of bed and ambulation.  

## 2019-10-03 NOTE — Progress Notes (Signed)
PROVIDER NOTE: Information contained herein reflects review and annotations entered in association with encounter. Interpretation of such information and data should be left to medically-trained personnel. Information provided to patient can be located elsewhere in the medical record under "Patient Instructions". Document created using STT-dictation technology, any transcriptional errors that may result from process are unintentional.    Patient: [name]  Service Category: Procedure  Provider: Gillis Santa, MD  DOB: 09-12-1931  DOS: 10/03/2019  Location: [facility]  MRN: [id number]  Setting: Ambulatory - outpatient  Referring Provider: [name], MD  Type: Established Patient  Specialty: Interventional Pain Management  PCP: [name], MD   Primary Reason for Visit: Interventional Pain Management Treatment. CC: Back Pain (low)  Procedure:          Anesthesia, Analgesia, Anxiolysis:  Type: Lumbar Facet, Medial Branch Block(s) #1  Primary Purpose: Diagnostic Region: Posterolateral Lumbosacral Spine Level:  L3, L4, L5,  Medial Branch Level(s). Injecting these levels blocks the L3-4, L4-5 lumbar facet joints. Laterality: Right  Type: Local Anesthesia  Local Anesthetic: Lidocaine 1-2%  Position: Prone   Indications: 1. Lumbar facet arthropathy   2. Lumbar spondylosis   3. Chronic pain syndrome    Pain Score: Pre-procedure: 2 /10 Post-procedure: 0-No pain/10   Last dose of Plavix 09/25/2019  Pre-op Assessment:  Mr. Laforge is a 86 y.o. (year old), male patient, seen today for interventional treatment. He  has a past surgical history that includes Total knee arthroplasty (Right, 03/2010); Knee arthroscopy (Right, 2008); Cataract extraction w/ intraocular lens  implant, bilateral (Bilateral); Coronary artery bypass graft (11/1999); Coronary stent placement (02/2012); Shoulder arthroscopy with open rotator cuff repair and distal clavicle acrominectomy  (Left, 02/27/2013); Trigger finger release (Left, 02/27/2013); Cholecystectomy open (11/2003); Joint replacement; Esophagogastroduodenoscopy (egd) with esophageal dilation (2010); Cardiac catheterization (08/2004); Cardiac catheterization (12/31/2011); Cardiac catheterization (2009); left and right heart catheterization with coronary angiogram (12/31/2011); percutaneous coronary stent intervention (pci-s) (12/31/2011); percutaneous coronary stent intervention (pci-s) (N/A, 01/03/2012); Percutaneous coronary intervention-balloon only (01/03/2012); left and right heart catheterization with coronary angiogram (N/A, 01/14/2014); Hand surgery; Cardiac catheterization (N/A, 08/13/2015); Esophagogastroduodenoscopy (N/A, 03/01/2017); maloney dilation (03/01/2017); Esophagogastroduodenoscopy (egd) with propofol (N/A, 09/12/2018); Botox injection (N/A, 09/12/2018); Balloon dilation (N/A, 09/12/2018); Coronary angioplasty with stent (01/03/2012); Coronary angioplasty with stent (01/14/2014); Esophagogastroduodenoscopy (egd) with propofol (N/A, 11/27/2018); Botox injection (N/A, 11/27/2018); and Esophageal dilation (11/27/2018). Mr. Capshaw has a current medication list which includes the following prescription(s): amlodipine, aspirin ec, one touch ultra 2, carvedilol, clopidogrel, dha-vitamin c-lutein, furosemide, glucose blood, hydrocodone bitartrate er, [START ON 11/17/2019] hydrocodone bitartrate er, hydrocodone-acetaminophen, [START ON 10/18/2019] hydrocodone-acetaminophen, losartan, nitroglycerin, ondansetron, onetouch delica lancets 40J, oxygen-helium, pantoprazole, polyethylene glycol, and tamsulosin, and the following Facility-Administered Medications: dexamethasone and ropivacaine (pf) 2 mg/ml (0.2%). His primarily concern today is the Back Pain (low)  Initial Vital Signs:  Pulse/HCG Rate: 70ECG Heart Rate: 89 Temp: 98.2 F (36.8 C) Resp: 18 BP: (!) 165/73 SpO2: 96 %  BMI: Estimated body mass index is 26.31 kg/m as calculated  from the following:   Height as of this encounter: 6' (1.829 m).   Weight as of this encounter: 194 lb (88 kg).  Risk Assessment: Allergies: Reviewed. He is allergic to doxazosin mesylate and methocarbamol.  Allergy Precautions: None required Coagulopathies: Reviewed. None identified.  Blood-thinner therapy: None at this time Active Infection(s): Reviewed. None identified. Mr. Quale is afebrile  Site Confirmation: Mr. Seidman was asked to confirm the procedure and laterality before marking the site Procedure checklist: Completed  Consent: Before the procedure and under the influence of no sedative(s), amnesic(s), or anxiolytics, the patient was informed of the treatment options, risks and possible complications. To fulfill our ethical and legal obligations, as recommended by the American Medical Association's Code of Ethics, I have informed the patient of my clinical impression; the nature and purpose of the treatment or procedure; the risks, benefits, and possible complications of the intervention; the alternatives, including doing nothing; the risk(s) and benefit(s) of the alternative treatment(s) or procedure(s); and the risk(s) and benefit(s) of doing nothing. The patient was provided information about the general risks and possible complications associated with the procedure. These may include, but are not limited to: failure to achieve desired goals, infection, bleeding, organ or nerve damage, allergic reactions, paralysis, and death. In addition, the patient was informed of those risks and complications associated to Spine-related procedures, such as failure to decrease pain; infection (i.e.: Meningitis, epidural or intraspinal abscess); bleeding (i.e.: epidural hematoma, subarachnoid hemorrhage, or any other type of intraspinal or peri-dural bleeding); organ or nerve damage (i.e.: Any type of peripheral nerve, nerve root, or spinal cord injury) with subsequent damage to sensory, motor, and/or  autonomic systems, resulting in permanent pain, numbness, and/or weakness of one or several areas of the body; allergic reactions; (i.e.: anaphylactic reaction); and/or death. Furthermore, the patient was informed of those risks and complications associated with the medications. These include, but are not limited to: allergic reactions (i.e.: anaphylactic or anaphylactoid reaction(s)); adrenal axis suppression; blood sugar elevation that in diabetics may result in ketoacidosis or comma; water retention that in patients with history of congestive heart failure may result in shortness of breath, pulmonary edema, and decompensation with resultant heart failure; weight gain; swelling or edema; medication-induced neural toxicity; particulate matter embolism and blood vessel occlusion with resultant organ, and/or nervous system infarction; and/or aseptic necrosis of one or more joints. Finally, the patient was informed that Medicine is not an exact science; therefore, there is also the possibility of unforeseen or unpredictable risks and/or possible complications that may result in a catastrophic outcome. The patient indicated having understood very clearly. We have given the patient no guarantees and we have made no promises. Enough time was given to the patient to ask questions, all of which were answered to the patient's satisfaction. Mr. Phariss has indicated that he wanted to continue with the procedure. Attestation: I, the ordering provider, attest that I have discussed with the patient the benefits, risks, side-effects, alternatives, likelihood of achieving goals, and potential problems during recovery for the procedure that I have provided informed consent. Date  Time: 10/03/2019 10:47 AM  Pre-Procedure Preparation:  Monitoring: As per clinic protocol. Respiration, ETCO2, SpO2, BP, heart rate and rhythm monitor placed and checked for adequate function Safety Precautions: Patient was assessed for positional  comfort and pressure points before starting the procedure. Time-out: I initiated and conducted the "Time-out" before starting the procedure, as per protocol. The patient was asked to participate by confirming the accuracy of the "Time Out" information. Verification of the correct person, site, and procedure were performed and confirmed by me, the nursing staff, and the patient. "Time-out" conducted as per Joint Commission's Universal Protocol (UP.01.01.01). Time: 1212  Description of Procedure:          Laterality: Right Levels:  L3, L4, L5, Medial Branch Level(s) Area Prepped: Posterior Lumbosacral Region DuraPrep (Iodine Povacrylex [0.7% available iodine] and Isopropyl Alcohol, 74% w/w) Safety Precautions: Aspiration looking for blood return was conducted prior to all injections.  At no point did we inject any substances, as a needle was being advanced. Before injecting, the patient was told to immediately notify me if he was experiencing any new onset of "ringing in the ears, or metallic taste in the mouth". No attempts were made at seeking any paresthesias. Safe injection practices and needle disposal techniques used. Medications properly checked for expiration dates. SDV (single dose vial) medications used. After the completion of the procedure, all disposable equipment used was discarded in the proper designated medical waste containers. Local Anesthesia: Protocol guidelines were followed. The patient was positioned over the fluoroscopy table. The area was prepped in the usual manner. The time-out was completed. The target area was identified using fluoroscopy. A 12-in long, straight, sterile hemostat was used with fluoroscopic guidance to locate the targets for each level blocked. Once located, the skin was marked with an approved surgical skin marker. Once all sites were marked, the skin (epidermis, dermis, and hypodermis), as well as deeper tissues (fat, connective tissue and muscle) were  infiltrated with a small amount of a short-acting local anesthetic, loaded on a 10cc syringe with a 25G, 1.5-in  Needle. An appropriate amount of time was allowed for local anesthetics to take effect before proceeding to the next step. Local Anesthetic: Lidocaine 2.0% The unused portion of the local anesthetic was discarded in the proper designated containers. Technical explanation of process:   L3 Medial Branch Nerve Block (MBB): The target area for the L3 medial branch is at the junction of the postero-lateral aspect of the superior articular process and the superior, posterior, and medial edge of the transverse process of L4. Under fluoroscopic guidance, a Quincke needle was inserted until contact was made with os over the superior postero-lateral aspect of the pedicular shadow (target area). After negative aspiration for blood, 51mL of the nerve block solution was injected without difficulty or complication. The needle was removed intact. L4 Medial Branch Nerve Block (MBB): The target area for the L4 medial branch is at the junction of the postero-lateral aspect of the superior articular process and the superior, posterior, and medial edge of the transverse process of L5. Under fluoroscopic guidance, a Quincke needle was inserted until contact was made with os over the superior postero-lateral aspect of the pedicular shadow (target area). After negative aspiration for blood, 2 mL of the nerve block solution was injected without difficulty or complication. The needle was removed intact. L5 Medial Branch Nerve Block (MBB): The target area for the L5 medial branch is at the junction of the postero-lateral aspect of the superior articular process and the superior, posterior, and medial edge of the sacral ala. Under fluoroscopic guidance, a Quincke needle was inserted until contact was made with os over the superior postero-lateral aspect of the pedicular shadow (target area). After negative aspiration for  blood, 38mL of the nerve block solution was injected without difficulty or complication. The needle was removed intact.  Nerve block solution: 8 cc solution made of 7 cc of 0.2% ropivacaine, 1 cc of Decadron 10 mg/cc.  2 cc injected at each level above on the right.  The unused portion of the solution was discarded in the proper designated containers. Procedural Needles: 22-gauge, 3.5-inch, Quincke needles used for all levels.  Once the entire procedure was completed, the treated area was cleaned, making sure to leave some of the prepping solution back to take advantage of its long term bactericidal properties.   Illustration of the posterior view of the lumbar spine and the  posterior neural structures. Laminae of L2 through S1 are labeled. DPRL5, dorsal primary ramus of L5; DPRS1, dorsal primary ramus of S1; DPR3, dorsal primary ramus of L3; FJ, facet (zygapophyseal) joint L3-L4; I, inferior articular process of L4; LB1, lateral branch of dorsal primary ramus of L1; IAB, inferior articular branches from L3 medial branch (supplies L4-L5 facet joint); IBP, intermediate branch plexus; MB3, medial branch of dorsal primary ramus of L3; NR3, third lumbar nerve root; S, superior articular process of L5; SAB, superior articular branches from L4 (supplies L4-5 facet joint also); TP3, transverse process of L3.  Vitals:   10/03/19 1121 10/03/19 1212 10/03/19 1217 10/03/19 1219  BP: (!) 165/73 (!) 193/87 (!) 188/93 (!) 184/65  Pulse: 70     Resp: 18 20 18 20   Temp:      SpO2: 96% 96% 96% 96%  Weight:      Height:         Start Time: 1212 hrs. End Time: 1219 hrs.  Imaging Guidance (Spinal):          Type of Imaging Technique: Fluoroscopy Guidance (Spinal) Indication(s): Assistance in needle guidance and placement for procedures requiring needle placement in or near specific anatomical locations not easily accessible without such assistance. Exposure Time: Please see nurses notes. Contrast: None  used. Fluoroscopic Guidance: I was personally present during the use of fluoroscopy. "Tunnel Vision Technique" used to obtain the best possible view of the target area. Parallax error corrected before commencing the procedure. "Direction-depth-direction" technique used to introduce the needle under continuous pulsed fluoroscopy. Once target was reached, antero-posterior, oblique, and lateral fluoroscopic projection used confirm needle placement in all planes. Images permanently stored in EMR. Interpretation: No contrast injected. I personally interpreted the imaging intraoperatively. Adequate needle placement confirmed in multiple planes. Permanent images saved into the patient's record.  Antibiotic Prophylaxis:   Anti-infectives (From admission, onward)   None     Indication(s): None identified  Post-operative Assessment:  Post-procedure Vital Signs:  Pulse/HCG Rate: 7089 Temp: 98.2 F (36.8 C) Resp: 20 BP: (!) 184/65 SpO2: 96 %  EBL: None  Complications: No immediate post-treatment complications observed by team, or reported by patient.  Note: The patient tolerated the entire procedure well. A repeat set of vitals were taken after the procedure and the patient was kept under observation following institutional policy, for this type of procedure. Post-procedural neurological assessment was performed, showing return to baseline, prior to discharge. The patient was provided with post-procedure discharge instructions, including a section on how to identify potential problems. Should any problems arise concerning this procedure, the patient was given instructions to immediately contact us, at any time, without hesitation. In any case, we plan to contact the patient by telephone for a follow-up status report regarding this interventional procedure.  Comments:  No additional relevant information.  Plan of Care  Orders:  Orders Placed This Encounter  Procedures  . [facility] C-ARM 1-60  MIN NO REPORT    Intraoperative interpretation by procedural physician at [facility].    Standing Status:   Standing    Number of Occurrences:   1    Order Specific Question:   Reason for exam:    Answer:   Assistance in needle guidance and placement for procedures requiring needle placement in or near specific anatomical locations not easily accessible without such assistance.   Medications ordered for procedure: Meds ordered this encounter  Medications  . lidocaine (XYLOCAINE) 2 % (with pres) injection 400 mg  .  ropivacaine (PF) 2 mg/mL (0.2%) (NAROPIN) injection 9 mL  . ropivacaine (PF) 2 mg/mL (0.2%) (NAROPIN) injection 9 mL  . dexamethasone (DECADRON) injection 10 mg  . dexamethasone (DECADRON) injection 10 mg   Medications administered: We administered lidocaine, ropivacaine (PF) 2 mg/mL (0.2%), and dexamethasone.  See the medical record for exact dosing, route, and time of administration.  Follow-up plan:   Return in about 4 weeks (around 10/31/2019) for Post Procedure Evaluation, virtual.    Recent Visits Date Type Provider Dept  09/18/19 Office Visit Gillis Santa, MD [facility]  Showing recent visits within past 90 days and meeting all other requirements Today's Visits Date Type Provider Dept  10/03/19 Procedure visit Gillis Santa, MD [facility]  Showing today's visits and meeting all other requirements Future Appointments Date Type Provider Dept  11/01/19 Appointment Gillis Santa, MD [facility]  11/13/19 Appointment Gillis Santa, MD [facility]  Showing future appointments within next 90 days and meeting all other requirements  Disposition: Discharge home  Discharge (Date  Time): 10/03/2019; 1225 hrs.   Primary Care Physician: [name], MD Location: [facility] Note by: Gillis Santa, MD Date: 10/03/2019; Time: 1:32 PM  Disclaimer:  Medicine is not an exact science. The  only guarantee in medicine is that nothing is guaranteed. It is important to note that the decision to proceed with this intervention was based on the information collected from the patient. The Data and conclusions were drawn from the patient's questionnaire, the interview, and the physical examination. Because the information was provided in large part by the patient, it cannot be guaranteed that it has not been purposely or unconsciously manipulated. Every effort has been made to obtain as much relevant data as possible for this evaluation. It is important to note that the conclusions that lead to this procedure are derived in large part from the available data. Always take into account that the treatment will also be dependent on availability of resources and existing treatment guidelines, considered by other Pain Management Practitioners as being common knowledge and practice, at the time of the intervention. For Medico-Legal purposes, it is also important to point out that variation in procedural techniques and pharmacological choices are the acceptable norm. The indications, contraindications, technique, and results of the above procedure should only be interpreted and judged by a Board-Certified Interventional Pain Specialist with extensive familiarity and expertise in the same exact procedure and technique.

## 2019-10-03 NOTE — Patient Instructions (Addendum)

## 2019-10-04 ENCOUNTER — Other Ambulatory Visit: Payer: Self-pay | Admitting: Cardiovascular Disease

## 2019-10-04 ENCOUNTER — Other Ambulatory Visit: Payer: Self-pay | Admitting: Internal Medicine

## 2019-10-11 NOTE — Progress Notes (Deleted)
Office Visit Note  Patient: [name]             Date of Birth: 16-May-1931           MRN: [id number]             PCP: [name], MD Referring: [name], MD Visit Date: 10/23/2019 Occupation: @GUAROCC @  Subjective:  No chief complaint on file.   History of Present Illness: [name] is a 86 y.o. male ***   Activities of Daily Living:  Patient reports morning stiffness for *** {minute/hour:19697}.   Patient {ACTIONS;DENIES/REPORTS:21021675::"Denies"} nocturnal pain.  Difficulty dressing/grooming: {ACTIONS;DENIES/REPORTS:21021675::"Denies"} Difficulty climbing stairs: {ACTIONS;DENIES/REPORTS:21021675::"Denies"} Difficulty getting out of chair: {ACTIONS;DENIES/REPORTS:21021675::"Denies"} Difficulty using hands for taps, buttons, cutlery, and/or writing: {ACTIONS;DENIES/REPORTS:21021675::"Denies"}  No Rheumatology ROS completed.   PMFS History:  Patient Active Problem List   Diagnosis Date Noted  . COVID-19 virus infection 04/12/2019  . Long term prescription opiate use 12/14/2018  . Neuropathy 12/14/2018  . CKD stage 3 due to type 2 diabetes mellitus ([place]) 12/14/2018  . Problems with swallowing and mastication   . Achalasia   . Right inguinal hernia 07/04/2018  . Advance directive discussed with patient 04/28/2018  . Chronic pain of both lower extremities 10/26/2017  . Neuropathic pain 10/26/2017  . Chronic pain syndrome 10/26/2017  . Iron deficiency anemia 07/05/2017  . Constipation 07/05/2017  . CKD (chronic kidney disease) stage 4, GFR 15-29 ml/min ([name]) 05/17/2017  . Seropositive rheumatoid arthritis ([place]) 05/17/2017  . DM (diabetes mellitus) type II controlled, neurological manifestation ([place]) 05/17/2017  . Atherosclerotic heart disease of native coronary artery with angina pectoris ([place]) 05/17/2017  . Dysphagia 05/17/2017  . High risk medication use 10/21/2016  . Primary osteoarthritis of both knees 10/21/2016  . History of right knee  joint replacement 10/21/2016  . Orthostatic hypotension 10/01/2016  . Tegretol-induced dizziness 05/14/2016  . Spinal stenosis of lumbar region without neurogenic claudication 03/23/2016  . Spondylosis without myelopathy or radiculopathy, lumbar region 03/23/2016  . Respiratory failure, chronic ([place]) 11/21/2014  . Rheumatoid arthritis ([place]) 11/05/2014  . Rheumatic fever without heart involvement 03/04/2014  . Ventral hernia 12/17/2013  . Routine general medical examination at a health care facility 08/29/2012  . Routine history and physical examination of adult 08/29/2012  . ILD (interstitial lung disease) ([place]) 11/28/2011  . Chronic diastolic heart failure ([place]) 09/14/2011  . Diabetic polyneuropathy associated with type 2 diabetes mellitus ([place]) 08/10/2010  . Obstructive sleep apnea 12/17/2008  . ESOPHAGEAL STRICTURE 10/09/2008  . Esophageal stricture 10/09/2008  . Reflux esophagitis 09/10/2008  . Diverticulosis of colon 09/10/2008  . Gastro-esophageal reflux disease with esophagitis 09/10/2008  . Coronary atherosclerosis 03/19/2008  . Aortic valve disorder 03/19/2008  . Thoracic aorta atherosclerosis ([place]) 03/19/2008  . Actinic keratosis 10/23/2007  . SLEEP DISORDER, CHRONIC 10/17/2006  . Disturbance in sleep behavior 10/17/2006  . 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 Essential hypertension 09/23/2006  . GERD 09/23/2006  . BENIGN PROSTATIC HYPERTROPHY 09/23/2006  . Enlarged prostate without lower urinary tract symptoms (luts) 09/23/2006  . HLD (hyperlipidemia) 09/23/2006    Past Medical History:  Diagnosis Date  . Arthritis    osteoarthritis, s/p R TKR, and digits  . CAD (coronary artery disease)    a. s/p CABG (2001)  b. s/p DES to RCA and cutting POBA to ostial PDA (2013)   c. s/p DES to SVG to OM2 (01/14/14) d. cath: 08/2015 NSTEMI w/ patent LIMA-LAD and 99% stenosis of SVG-OM w/ DES placed.  CTO of SVG-RCA and SVG-D1.   . Cataract   . Chronic  diastolic CHF (congestive heart failure) ([place])    a) 09/13 ECHO- LVEF 16-10%, grade 1 diastolic dysfunction, mild LA dilatation, atrial septal aneurysm, AV mobility restricted, but no sig AS by doppler; b) 09/04/08 ECHO- LVH, ef 60%, mild AS, c. echo 08/2015: EF perserved of 55-60% with inferolateral HK. Mild AS noted.  . Chronic kidney disease, stage III (moderate)   . Chronic lower back pain   . Colon polyps   . COVID-19   . Diverticulosis   . Dyspnea 2009 since July -Sept   05/06/08-CPST-  normal effort, reduced VO2 max 20.5 /65%, reduced at 8.2/ 40%, normal breathing resetvca of 55%, submaximal heart rate response 112/77%, flattened o2 pluse response at peak exercise-12 ml/beat @ 85%, No VQ mismatch abnormalities, All c/w CIRC Limitation  . Enlarged prostate   . Esophageal stricture    a. s/p dilation spring 2010  . GERD (gastroesophageal reflux disease)   . Heart murmur   . Hiatal hernia   . History of carpal tunnel syndrome    Bilateral  . History of kidney stones   . History of PFTs    mixed pattern on spiro. mild restn on lung volumes with near normal DLCO. Pattern can be explained by CABG scar. Fev1 2.2L/73%, ratio 68 (67), TLC 4.7/68%,RV 1.5L/55%,DLCO 79%  . Hyperlipidemia   . Hypertension   . Interstitial lung disease ([name])    NOS  . Iron deficiency anemia   . Nausea & vomiting    2018/2019  . On home oxygen therapy    2 L [place] at bedtime  . Osteoporosis   . Overweight (BMI 25.0-29.9)    BMI 29  . Peripheral neuropathy   . RA (rheumatoid arthritis) ([name])    [name]  . Seropositive rheumatoid arthritis ([place])   . Type II diabetes mellitus ([name])    diet controlled  . Wears glasses   . Wears partial dentures    upper    Family History  Problem Relation Age of Onset  . COPD Mother   . Heart disease Father   . Heart attack Father   . Stomach cancer Brother   . Diabetes Brother   . Colon cancer Brother 25  . Alcohol abuse Sister   . Stroke Sister   . Rectal  cancer Neg Hx    Past Surgical History:  Procedure Laterality Date  . BALLOON DILATION N/A 09/12/2018   Procedure: BALLOON DILATION;  Surgeon: Irene Shipper, MD;  Location: [facility];  Service: Endoscopy;  Laterality: N/A;  . BOTOX INJECTION N/A 09/12/2018   Procedure: BOTOX INJECTION;  Surgeon: Irene Shipper, MD;  Location: [facility];  Service: Endoscopy;  Laterality: N/A;  . BOTOX INJECTION N/A 11/27/2018   Procedure: BOTOX INJECTION;  Surgeon: Irene Shipper, MD;  Location: [facility];  Service: Endoscopy;  Laterality: N/A;  . CARDIAC CATHETERIZATION  08/2004   CP- no MI, Cath- small vessell disease   . CARDIAC CATHETERIZATION  12/31/2011   80% distal LM, 100% native LAD, LCx and RCA, 30% prox SVG-OM, SVG-D1 normal, 99% distal, 80% ostial SVG-RCA distal to graft, LIMA-LAD normal; LVEF mildly decreased with posterior basal AK   . CARDIAC CATHETERIZATION  2009   with patent grafts/notes 12/31/2011  . CARDIAC CATHETERIZATION N/A 08/13/2015   Procedure: Left Heart Cath and Cors/Grafts Angiography;  Surgeon: Sherren Mocha, MD;  Location: [facility];  Service: Cardiovascular;  Laterality: N/A;  .  CATARACT EXTRACTION W/ INTRAOCULAR LENS  IMPLANT, BILATERAL Bilateral   . CHOLECYSTECTOMY OPEN  11/2003   [name]  . CORONARY ANGIOPLASTY WITH STENT PLACEMENT  01/03/2012   Successful DES to SVG-RCA and cutting balloon angioplasty ostial  PDA   . CORONARY ANGIOPLASTY WITH STENT PLACEMENT  01/14/2014   "1"  . CORONARY ARTERY BYPASS GRAFT  11/1999   CABG X5  . CORONARY STENT PLACEMENT  02/2012   1 stent and balloon  . ESOPHAGEAL DILATION  11/27/2018   Procedure: ESOPHAGEAL DILATION;  Surgeon: Irene Shipper, MD;  Location: [facility];  Service: Endoscopy;;  . ESOPHAGOGASTRODUODENOSCOPY N/A 03/01/2017   Procedure: ESOPHAGOGASTRODUODENOSCOPY (EGD);  Surgeon: Irene Shipper, MD;  Location: [facility];  Service: Endoscopy;  Laterality: N/A;  . ESOPHAGOGASTRODUODENOSCOPY (EGD) WITH ESOPHAGEAL  DILATION  2010  . ESOPHAGOGASTRODUODENOSCOPY (EGD) WITH PROPOFOL N/A 09/12/2018   Procedure: ESOPHAGOGASTRODUODENOSCOPY (EGD) WITH PROPOFOL;  Surgeon: Irene Shipper, MD;  Location: [facility];  Service: Endoscopy;  Laterality: N/A;  . ESOPHAGOGASTRODUODENOSCOPY (EGD) WITH PROPOFOL N/A 11/27/2018   Procedure: ESOPHAGOGASTRODUODENOSCOPY (EGD) WITH PROPOFOL, WITH BALLOON DILATION;  Surgeon: Irene Shipper, MD;  Location: [facility];  Service: Endoscopy;  Laterality: N/A;  . HAND SURGERY     bilateral carpal tunnel releases  . JOINT REPLACEMENT    . KNEE ARTHROSCOPY Right 2008  . LEFT AND RIGHT HEART CATHETERIZATION WITH CORONARY ANGIOGRAM  12/31/2011   Procedure: LEFT AND RIGHT HEART CATHETERIZATION WITH CORONARY ANGIOGRAM;  Surgeon: Burnell Blanks, MD;  Location: [facility] CATH LAB;  Service: Cardiovascular;;  . LEFT AND RIGHT HEART CATHETERIZATION WITH CORONARY ANGIOGRAM N/A 01/14/2014   Procedure: LEFT AND RIGHT HEART CATHETERIZATION WITH CORONARY ANGIOGRAM;  Surgeon: Peter M Martinique, MD;  Location: [facility] CATH LAB;  Service: Cardiovascular;  Laterality: N/A;  . MALONEY DILATION  03/01/2017   Procedure: Venia Minks DILATION;  Surgeon: Irene Shipper, MD;  Location: [facility];  Service: Endoscopy;;  . PERCUTANEOUS CORONARY INTERVENTION-BALLOON ONLY  01/03/2012   Procedure: PERCUTANEOUS CORONARY INTERVENTION-BALLOON ONLY;  Surgeon: Peter M Martinique, MD;  Location: [facility] CATH LAB;  Service: Cardiovascular;;  . PERCUTANEOUS CORONARY STENT INTERVENTION (PCI-S)  12/31/2011   Procedure: PERCUTANEOUS CORONARY STENT INTERVENTION (PCI-S);  Surgeon: Burnell Blanks, MD;  Location: [facility] CATH LAB;  Service: Cardiovascular;;  . PERCUTANEOUS CORONARY STENT INTERVENTION (PCI-S) N/A 01/03/2012   Procedure: PERCUTANEOUS CORONARY STENT INTERVENTION (PCI-S);  Surgeon: Peter M Martinique, MD;  Location: [facility] CATH LAB;  Service: Cardiovascular;  Laterality: N/A;  . SHOULDER ARTHROSCOPY WITH OPEN ROTATOR CUFF REPAIR AND DISTAL  CLAVICLE ACROMINECTOMY Left 02/27/2013   Procedure: LEFT SHOULDER ARTHROSCOPY WITH MINI OPEN ROTATOR CUFF REPAIR AND SUBACROMIAL DECOMPRESSION AND DISTAL CLAVICLE RESECTION;  Surgeon: [name], MD;  Location: [facility];  Service: Orthopedics;  Laterality: Left;  . TOTAL KNEE ARTHROPLASTY Right 03/2010   [name]  . TRIGGER FINGER RELEASE Left 02/27/2013   Procedure: RELEASE TRIGGER FINGER/A-1 PULLEY;  Surgeon: [name], MD;  Location: [name];  Service: Orthopedics;  Laterality: Left;   Social History   Social History Narrative   No living will   Requests wife as health care POA-- alternate is daughter Hassan Rowan   Discussed DNR --he requests this (done 08/29/12)   Not sure about feeding tube---but might accept for some time   Patient lives with wife and daughter in a one story home.  Has 3 children.  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   Immunization History  Administered Date(s) Administered  . H1N1  03/27/2008  . Influenza Split 12/29/2010, 01/18/2012  . Influenza Whole 02/03/2007, 01/09/2008, 01/01/2009, 12/31/2009  . Influenza,inj,Quad PF,6+ Mos 12/13/2012, 01/15/2014, 01/15/2015, 01/06/2016, 01/03/2017, 02/09/2018, 01/30/2019  . PFIZER SARS-COV-2 Vaccination 04/26/2019, 05/17/2019  . Pneumococcal Conjugate-13 09/10/2013  . Pneumococcal Polysaccharide-23 03/05/1997, 08/17/2011  . Td 03/05/1997, 08/17/2011     Objective: Vital Signs: There were no vitals taken for this visit.   Physical Exam   Musculoskeletal Exam: ***  CDAI Exam: CDAI Score: -- Patient Global: --; Provider Global: -- Swollen: --; Tender: -- Joint Exam 10/23/2019   No joint exam has been documented for this visit   There is currently no information documented on the homunculus. Go to the Rheumatology activity and complete the homunculus joint exam.  Investigation: No additional findings.  Imaging: DG PAIN CLINIC C-ARM 1-60 MIN NO REPORT  Result Date:  10/03/2019 Fluoro was used, but no Radiologist interpretation will be provided. Please refer to "NOTES" tab for provider progress note.   Recent Labs: Lab Results  Component Value Date   WBC 7.0 06/13/2019   HGB 14.1 06/13/2019   PLT 199.0 06/13/2019   NA 133 (L) 06/13/2019   K 4.6 06/13/2019   CL 96 06/13/2019   CO2 33 (H) 06/13/2019   GLUCOSE 287 (H) 06/13/2019   BUN 33 (H) 06/13/2019   CREATININE 2.06 (H) 06/13/2019   BILITOT 0.8 06/13/2019   ALKPHOS 58 06/13/2019   AST 14 06/13/2019   ALT 9 06/13/2019   PROT 8.0 06/13/2019   ALBUMIN 4.1 06/13/2019   ALBUMIN 4.1 06/13/2019   CALCIUM 9.9 06/13/2019   GFRAA 30 (L) 06/24/2017    Speciality Comments: No specialty comments available.  Procedures:  No procedures performed Allergies: Doxazosin mesylate and Methocarbamol   Assessment / Plan:     Visit Diagnoses: No diagnosis found.  Orders: No orders of the defined types were placed in this encounter.  No orders of the defined types were placed in this encounter.   Face-to-face time spent with patient was *** minutes. Greater than 50% of time was spent in counseling and coordination of care.  Follow-Up Instructions: No follow-ups on file.   Earnestine Mealing, CMA  Note - This record has been created using Editor, commissioning.  Chart creation errors have been sought, but may not always  have been located. Such creation errors do not reflect on  the standard of medical care.

## 2019-10-12 ENCOUNTER — Telehealth: Payer: Medicare Other

## 2019-10-12 NOTE — Chronic Care Management (AMB) (Deleted)
Chronic Care Management Pharmacy  Name: XIAN ALVES  MRN: [id number] DOB: 12-Jul-1931  Chief Complaint/ HPI  [name],  86 y.o., male presents for their Follow-Up CCM visit with the clinical pharmacist via telephone.  PCP : [name], MD  Their chronic conditions include: hypertension, hyperlipidemia, coronary atherosclerosis, chronic diastolic heart failure, sleep apnea, ILD, chronic resp. failure, GERD, type 2 DM, CKD, polyneuropathy, rheumatoid arthritis, BPH, chronic pain, chronic sleep disorder  Patient concerns:  denies medication concerns  Office Visits: last CCM visit 08/20/19   08/22/19: Right-sided low back pain - talked today about trying lidocaine patches over this area and even perhaps trying Voltaren gel. F/u with Dr. Carlus Pavlov in June.   3/10/21Silvio Pate, AWV - hesitant to treat DM unless A1c > 9%, no metformin CKD, no Actos CHF  04/12/19: [name], COVID-19 eval - tested positive 03/19/19, sent home, symptoms worsened and seen in ED on 04/02/20, sent home again, some improvement over past several days  Consult Visit:  09/18/19: [name], neuropathic pain - continue hydrocodone IR as prescribed, up to 5 tablets daily. Add hydrocodone bitartrate 10 mg BID.   06/12/19: Lateef, neuropathic pain - missed appt  03/18/20: Lateef, neuropathic pain - increased low back pain, taking 1 extra hydrocodone most days requesting increases rx quantity from 120-150   02/13/19: Nephrology - start amlodipine 2.5 mg daily, call once week with BP, defer RAASi for now  02/12/19: [name], ILD - completed CT chest, walking desaturation text and PFTs - all stable, reports SOB has improved, reports inhalers did not help in the past, maintains lean body weight, 2L oxygen at night. rtc 9 months  11/16/18: Cardiology - multiple stents placed 2013, 2015, 2017, cont current medications  Allergies  Allergen Reactions  . Doxazosin Mesylate Other (See Comments)    dizziness  . Methocarbamol  Rash   Medications: Outpatient Encounter Medications as of 10/12/2019  Medication Sig Note  . amLODipine (NORVASC) 5 MG tablet Take 5 mg by mouth daily.   [name] aspirin EC 81 MG tablet Take 81 mg by mouth at bedtime.   . Blood Glucose Monitoring Suppl (ONE TOUCH ULTRA 2) w/Device KIT Use to obtain blood sugar daily. Dx Code E11.40   . carvedilol (COREG) 3.125 MG tablet TAKE 1 TABLET BY MOUTH TWICE DAILY WITH MEALS   . clopidogrel (PLAVIX) 75 MG tablet Take 1 tablet (75 mg total) by mouth daily. Must schedule overdue follow up appt  for further refills   . DHA-Vitamin C-Lutein (EYE HEALTH FORMULA PO) Take 1 tablet by mouth daily.    . furosemide (LASIX) 40 MG tablet Take 40 mg by mouth at bedtime. Monday, Wednesday, Friday.   [name] glucose blood (ONE TOUCH ULTRA TEST) test strip USE TO CHECK BLOOD SUGAR ONCE A DAY Dx Code E11.40   . HYDROcodone Bitartrate ER 10 MG CP12 Take 10 mg by mouth every 12 (twelve) hours. Must last 30 days.   Derrill Memo ON 11/17/2019] HYDROcodone Bitartrate ER 10 MG CP12 Take 10 mg by mouth every 12 (twelve) hours. Must last 30 days.   [name] HYDROcodone-acetaminophen (NORCO) 10-325 MG tablet Take 1 tablet by mouth every 4 (four) hours as needed for severe pain. Must last 30 days.   Derrill Memo ON 10/18/2019] HYDROcodone-acetaminophen (NORCO) 10-325 MG tablet Take 1 tablet by mouth every 4 (four) hours as needed for severe pain. Must last 30 days.   [name] losartan (COZAAR) 25 MG tablet Take 25 mg by mouth daily.   [name]  nitroGLYCERIN (NITROSTAT) 0.4 MG SL tablet DISSOLVE 1 TABLET UNDER TONGUE AS NEEDEDFOR CHEST PAIN. MAY REPEAT 5 MINUTES APART 3 TIMES IF NEEDED. IF NO RELIEF CALL 911   . ondansetron (ZOFRAN) 4 MG tablet TAKE 1 TABLET BY MOUTH EVERY 8 HOURS AS NEEDED FOR NAUSEA OR VOMITING   . ONETOUCH DELICA LANCETS 40J MISC 1 each by In Vitro route daily. Dx Code E11.49   . OXYGEN Inhale into the lungs. Per pt- Uses Oxygen 2 liters at night.   . pantoprazole (PROTONIX) 40 MG tablet Take 1 tablet (40  mg total) by mouth daily.   . polyethylene glycol (MIRALAX / GLYCOLAX) 17 g packet Take 51 g by mouth daily.  11/20/2018: 3 capfuls   . tamsulosin (FLOMAX) 0.4 MG CAPS capsule Take 0.4 mg by mouth daily.    No facility-administered encounter medications on file as of 10/12/2019.   Current Diagnosis/Assessment:   Emergency planning/management officer Strain: Low Risk   . Difficulty of Paying Living Expenses: Not very hard   Goals Addressed   None    Hypertension   CMP Latest Ref Rng & Units 06/13/2019 04/28/2018 10/17/2017  Glucose 70 - 99 mg/dL 287(H) 305(H) 231(H)  BUN 6 - 23 mg/dL 33(H) 32(H) 29(H)  Creatinine 0.40 - 1.50 mg/dL 2.06(H) 2.24(H) 2.47(H)  Sodium 135 - 145 mEq/L 133(L) 136 138  Potassium 3.5 - 5.1 mEq/L 4.6 4.8 4.7  Chloride 96 - 112 mEq/L 96 95(L) 97  CO2 19 - 32 mEq/L 33(H) 34(H) 31  Calcium 8.4 - 10.5 mg/dL 9.9 10.6(H) 9.8  Total Protein 6.0 - 8.3 g/dL 8.0 8.2 -  Total Bilirubin 0.2 - 1.2 mg/dL 0.8 0.6 -  Alkaline Phos 39 - 117 U/L 58 58 -  AST 0 - 37 U/L 14 17 -  ALT 0 - 53 U/L 9 14 -   Office blood pressures are: BP Readings from Last 3 Encounters:  10/03/19 (!) 184/65  09/18/19 (!) 149/76  08/22/19 137/73   Patient has failed these meds in the past: none Patient checks BP at home: every other day, before breakfast, after waking  Patient home BP readings:   5/19 - 137/71 mmHg  5/22 - 189/85 mmHg  6/1 - 183/81 mmHg  6/2 - 151/75 mmHg  BP < 140/90 mmHg Patient is currently uncontrolled on the following medications:   Amlodipine 5 mg - 1 tablet daily (Lori Foster)  Losartan 25 mg - 1 tablet daily (Lori Foster)  Carvedilol 3.125 mg - 1 tablet BID  Furosemide 40 mg - 1 tablet M,W,F at bedtime  We discussed: denies missing any doses of BP medications, reports blood work with nephrology completed 09/04/19 and appt 09/18/19   Plan: Continue current medications; Will alert nephrology of elevated BP readings for further management. Recommend continuing to check BP at the  same time each day before morning medications. Repeat BP check if BG > 140/90 mmHg. Follow up call in 1 month to assess.   Hyperlipidemia/CAD   Lipid Panel     Component Value Date/Time   CHOL 209 (H) 06/13/2019 1057   TRIG 275.0 (H) 06/13/2019 1057   HDL 35.50 (L) 06/13/2019 1057   CHOLHDL 6 06/13/2019 1057   VLDL 55.0 (H) 06/13/2019 1057   LDLCALC 43 10/14/2016 1429   LDLDIRECT 125.0 06/13/2019 1057    CBC Latest Ref Rng & Units 06/13/2019 04/28/2018 06/24/2017  WBC 4.0 - 10.5 K/uL 7.0 8.3 7.8  Hemoglobin 13.0 - 17.0 g/dL 14.1 15.1 12.0(L)  Hematocrit 39 -  52 % 41.4 44.4 38.2(L)  Platelets 150 - 400 K/uL 199.0 189.0 201   LDL goal < 70, TG < 150, HDL > 40 Patient has tried these meds in past: atorvastatin (stomach pain/nausea - stopped 10/2017), simvastatin 20 mg (switched to atorvastatin) Patient is currently uncontrolled on the following medications:   No statin therapy   Aspirin 81 mg - 1 tablet daily  Clopidogrel 75 mg - 1 tablet daily  We discussed: Patient is not interested in additional medications at this time. Indefinite DAPT per cardio due to multiple stents placed, CBC stable, denies abnormal bruising or bleeding. Discussed heart healthy diet/exercise.   Plan: Continue current medications; Focus on heart healthy diet and limiting cholesterol intake. Increase exercise. Consider discussion about statin retrial if dietary changes fail.  Diabetes   Recent Relevant Labs: Lab Results  Component Value Date/Time   HGBA1C 8.0 (H) 06/13/2019 10:57 AM   HGBA1C 8.9 (A) 10/27/2018 12:28 PM   HGBA1C 8.1 (H) 04/28/2018 12:28 PM   MICROALBUR 7.5 (H) 04/28/2018 12:28 PM   MICROALBUR 0.5 02/04/2009 10:09 AM    Checking BG: twice weekly before breakfast  Recent FBG Readings: 180s  A1c goal < 8% Patient has failed these meds in past: metformin - CKD Patient is currently controlled on the following medications:   No pharmacotherapy  Last diabetic eye exam:  Lab Results    Component Value Date/Time   HMDIABEYEEXA No Retinopathy 02/12/2019 12:00 AM    Last diabetic foot exam:  Lab Results  Component Value Date/Time   HMDIABFOOTEX done 10/27/2018 12:00 AM    We discussed: Pt is focusing more on dietary changes, however, BG remains about the same. Discussed option of starting oral medication to lower BG but pt declines at this time.  Diet: 3 meals/day - breakfast: shredded wheat cereal, yogurt, chocolate milk, pop-tart  - lunch: vegetables mostly (creamed potatoes, cabbage, turnip salad) - dinner: bowl of soup  Exercise: walking maybe once a week, reports some weight loss in the past year  Plan: Continue control with diet and exercise; Increase walking and limit sugary beverages and snacks (pop-tarts, chocolate milk). Utilize diabetes plate method to limit portions. F/u in 1 month to assess BG control.   Medication Management  Misc: nitroglycerin 0.4 mg SL PRN  OTCs: Ocuvite supplement, MiraLAX PRN  Pharmacy/Benefits: [employer]  Adherence: refills timely   Affordability: denies concerns  CCM Follow Up: 10/12/19 at 12:00 PM (telephone)   [name], PharmD Clinical Pharmacist [facility] [phone]

## 2019-10-23 ENCOUNTER — Encounter: Payer: Self-pay | Admitting: Rheumatology

## 2019-10-23 ENCOUNTER — Ambulatory Visit: Payer: Medicare Other | Admitting: Physician Assistant

## 2019-10-23 ENCOUNTER — Telehealth (INDEPENDENT_AMBULATORY_CARE_PROVIDER_SITE_OTHER): Payer: Medicare Other | Admitting: Rheumatology

## 2019-10-23 ENCOUNTER — Other Ambulatory Visit: Payer: Self-pay

## 2019-10-23 VITALS — Ht 72.0 in

## 2019-10-23 DIAGNOSIS — M059 Rheumatoid arthritis with rheumatoid factor, unspecified: Secondary | ICD-10-CM | POA: Diagnosis not present

## 2019-10-23 DIAGNOSIS — Z8719 Personal history of other diseases of the digestive system: Secondary | ICD-10-CM

## 2019-10-23 DIAGNOSIS — Z8669 Personal history of other diseases of the nervous system and sense organs: Secondary | ICD-10-CM

## 2019-10-23 DIAGNOSIS — M1712 Unilateral primary osteoarthritis, left knee: Secondary | ICD-10-CM

## 2019-10-23 DIAGNOSIS — I5032 Chronic diastolic (congestive) heart failure: Secondary | ICD-10-CM

## 2019-10-23 DIAGNOSIS — R202 Paresthesia of skin: Secondary | ICD-10-CM | POA: Diagnosis not present

## 2019-10-23 DIAGNOSIS — M47816 Spondylosis without myelopathy or radiculopathy, lumbar region: Secondary | ICD-10-CM

## 2019-10-23 DIAGNOSIS — Z8639 Personal history of other endocrine, nutritional and metabolic disease: Secondary | ICD-10-CM

## 2019-10-23 DIAGNOSIS — J849 Interstitial pulmonary disease, unspecified: Secondary | ICD-10-CM

## 2019-10-23 DIAGNOSIS — Z8679 Personal history of other diseases of the circulatory system: Secondary | ICD-10-CM

## 2019-10-23 DIAGNOSIS — N184 Chronic kidney disease, stage 4 (severe): Secondary | ICD-10-CM

## 2019-10-23 DIAGNOSIS — Z96651 Presence of right artificial knee joint: Secondary | ICD-10-CM

## 2019-10-23 DIAGNOSIS — Z79899 Other long term (current) drug therapy: Secondary | ICD-10-CM

## 2019-10-23 NOTE — Progress Notes (Signed)
Virtual Visit via Telephone Note  I connected with [name] on 10/23/19 at  4:15 PM EDT by telephone and verified that I am speaking with the correct person using two identifiers.  Location: Patient: Home  Provider: Clinic  This service was conducted via virtual visit. The patient was located at home. I was located in my office.  Consent was obtained prior to the virtual visit and is aware of possible charges through their insurance for this visit.  The patient is an established patient.  Dr. Estanislado Pandy, MD conducted the virtual visit and Hazel Sams, PA-C acted as scribe during the service.  Office staff helped with scheduling follow up visits after the service was conducted.   I discussed the limitations, risks, security and privacy concerns of performing an evaluation and management service by telephone and the availability of in person appointments. I also discussed with the patient that there may be a patient responsible charge related to this service. The patient expressed understanding and agreed to proceed.  CC: Pain in both hands History of Present Illness: Patient is a 86 year old male with a past medical history of seropositive rheumatoid arthritis, ILD, and osteoarthritis. He reports he has been having increased pain and a burning sensation in both hands.  He states he has a history of carpal tunnel release bilaterally.  He experiences nocturnal symptoms at times.  He denies any joint swelling in his hands.  He has chronic lower back pain, and he has cortisone injections performed by his back specialist.  He experiences intermittent discomfort in the right knee which is replaced. He continues to have shortness of breath intermittently.  He is followed by Dr. Chase Caller  Review of Systems  Constitutional: Positive for malaise/fatigue. Negative for fever.  Eyes: Negative for photophobia, pain, discharge and redness.  Respiratory: Positive for shortness of breath (Hx of ILD).  Negative for cough and wheezing.   Cardiovascular: Negative for chest pain and palpitations.  Gastrointestinal: Negative for blood in stool, constipation and diarrhea.  Genitourinary: Negative for dysuria.  Musculoskeletal: Positive for joint pain. Negative for back pain, myalgias and neck pain.  Skin: Negative for rash.  Neurological: Positive for tingling (Both hands). Negative for dizziness and headaches.  Psychiatric/Behavioral: Negative for depression. The patient is not nervous/anxious and does not have insomnia.       Observations/Objective: Physical Exam Neurological:     Mental Status: He is alert and oriented to person, place, and time.  Psychiatric:        Mood and Affect: Mood and affect normal.        Cognition and Memory: Memory normal.        Judgment: Judgment normal.    Patient reports morning stiffness for   several hours.   Patient denies nocturnal pain.  Difficulty dressing/grooming: Denies Difficulty climbing stairs: Denies Difficulty getting out of chair: Denies Difficulty using hands for taps, buttons, cutlery, and/or writing: Denies    Assessment and Plan: Visit Diagnoses: Seropositive rheumatoid arthritis ([place]) -  RF +, CCP+:  He has been experiencing increased pain in both hands.  He has not noticed any joint swelling.  He is experiencing paresthesias in both hands and has had some nocturnal symptoms.  He has had carpal tunnel release in the past.  We discussed scheduling a nerve conduction study but he declined at this time.  We discussed the use of carpal tunnel night splints which she is willing to try.  He is not currently taking any immunosuppressive  agents for the management of rheumatoid arthritis.  He has not had any recent rheumatoid arthritis flares.  He was advised to notify us if he develops increased joint pain or joint swelling.  He will follow-up in the office in 3 months.  ILD (interstitial lung disease) ([place]) - Followed up by Dr. Chase Caller.     He continues to have intermittent symptoms of shortness of breath.  He was advised to schedule follow-up visit with Dr. Chase Caller.  High risk medication use - D/c Imuran and Cellcept in the past due to GI SE. He has not taking any immunosuppressive agents at this time.  Paresthesia of both hands: He has been experiencing paresthesias in both hands.  He has not noticed any joint swelling.  He has a history of carpal tunnel release bilaterally he declined a nerve conduction study.  We encouraged him to use carpal tunnel night splints.  He will purchase these.  He was advised to notify us if his symptoms persist or worsen.  History of right knee joint replacement:  Chronic pain.  He has no difficulty climbing steps or getting up from a seated position.  Primary osteoarthritis of left knee:  He is not having any discomfort in his left knee joint at this time.  He has not noticed any joint swelling.  He has no difficulty climbing steps or rising from a seated position.  Spondylosis without myelopathy or radiculopathy, lumbar region: Chronic pain.    He has injections performed and is followed by pain management.  Other medical conditions are listed as follows:   Chronic diastolic heart failure ([name])  CKD (chronic kidney disease) stage 4, GFR 15-29 ml/min ([name])  History of coronary artery disease  History of diabetes mellitus  History of hyperlipidemia  History of sleep apnea  History of hypertension  History of gastroesophageal reflux (GERD)  History of diverticulosis   Follow Up Instructions: He will follow up in 3 months.    I discussed the assessment and treatment plan with the patient. The patient was provided an opportunity to ask questions and all were answered. The patient agreed with the plan and demonstrated an understanding of the instructions.   The patient was advised to call back or seek an in-person evaluation if the symptoms worsen or if the condition  fails to improve as anticipated.  I provided 20 minutes of non-face-to-face time during this encounter.   [name], MD   Scribed by-  Hazel Sams, PA-C

## 2019-10-24 ENCOUNTER — Telehealth: Payer: Self-pay | Admitting: Rheumatology

## 2019-10-24 NOTE — Telephone Encounter (Signed)
Patient advised prescription has been placed in the mail.

## 2019-10-24 NOTE — Telephone Encounter (Signed)
Patient called to schedule his 3 month follow-up appointment with Dr. Estanislado Pandy.  Patient stated he wanted an in-person appointment and scheduled for Friday, 01/25/20 at 10:45 am.    Patient also wanted to confirm that Dr. Estanislado Pandy mailed the prescription for hand splints to wear at night.

## 2019-10-31 ENCOUNTER — Encounter: Payer: Self-pay | Admitting: Student in an Organized Health Care Education/Training Program

## 2019-11-01 ENCOUNTER — Encounter: Payer: Self-pay | Admitting: Student in an Organized Health Care Education/Training Program

## 2019-11-01 ENCOUNTER — Ambulatory Visit
Payer: Medicare Other | Attending: Student in an Organized Health Care Education/Training Program | Admitting: Student in an Organized Health Care Education/Training Program

## 2019-11-01 ENCOUNTER — Other Ambulatory Visit: Payer: Self-pay

## 2019-11-01 DIAGNOSIS — M47816 Spondylosis without myelopathy or radiculopathy, lumbar region: Secondary | ICD-10-CM | POA: Diagnosis not present

## 2019-11-01 DIAGNOSIS — G894 Chronic pain syndrome: Secondary | ICD-10-CM | POA: Diagnosis not present

## 2019-11-01 DIAGNOSIS — M79604 Pain in right leg: Secondary | ICD-10-CM | POA: Diagnosis not present

## 2019-11-01 DIAGNOSIS — M79605 Pain in left leg: Secondary | ICD-10-CM

## 2019-11-01 DIAGNOSIS — M792 Neuralgia and neuritis, unspecified: Secondary | ICD-10-CM

## 2019-11-01 DIAGNOSIS — E1142 Type 2 diabetes mellitus with diabetic polyneuropathy: Secondary | ICD-10-CM

## 2019-11-01 DIAGNOSIS — G8929 Other chronic pain: Secondary | ICD-10-CM

## 2019-11-01 NOTE — Progress Notes (Signed)
Patient: [name]  Service Category: E/M  Provider: Gillis Santa, MD  DOB: Aug 13, 1931  DOS: 11/01/2019  Location: Office  MRN: [id number]  Setting: Ambulatory outpatient  Referring Provider: [name], MD  Type: Established Patient  Specialty: Interventional Pain Management  PCP: [name], MD  Location: Home  Delivery: TeleHealth     Virtual Encounter - Pain Management PROVIDER NOTE: Information contained herein reflects review and annotations entered in association with encounter. Interpretation of such information and data should be left to medically-trained personnel. Information provided to patient can be located elsewhere in the medical record under "Patient Instructions". Document created using STT-dictation technology, any transcriptional errors that may result from process are unintentional.    Contact & Pharmacy Preferred: [phone] Home: [phone] (home) Mobile: [phone] (mobile) E-mail: No e-mail address on record  [facility], [place], [place] [address], [place] [address] Phone: [phone] Fax: [phone]  [place], [place] [address] [place] [address] Phone: [phone] Fax: [phone]   Pre-screening  [name] offered "in-person" vs "virtual" encounter. He indicated preferring virtual for this encounter.   Reason COVID-19*  Social distancing based on CDC and AMA recommendations.   I contacted RAYN ENDERSON on 11/01/2019 via video conference.      I clearly identified myself as Gillis Santa, MD. I verified that I was speaking with the correct person using two identifiers (Name: [name], and date of birth: Jun 13, 1931).  Consent I sought verbal advanced consent from [name] for virtual visit interactions. I informed [name] of possible security and privacy concerns, risks, and limitations associated with  providing "not-in-person" medical evaluation and management services. I also informed [name] of the availability of "in-person" appointments. Finally, I informed him that there would be a charge for the virtual visit and that he could be  personally, fully or partially, financially responsible for it. [name] expressed understanding and agreed to proceed.   Historic Elements   [name] is a 86 y.o. year old, male patient evaluated today after his last contact with our practice on 10/03/2019. Mr. Leu  has a past medical history of Arthritis, CAD (coronary artery disease), Cataract, Chronic diastolic CHF (congestive heart failure) ([place]), Chronic kidney disease, stage III (moderate), Chronic lower back pain, Colon polyps, COVID-19, Diverticulosis, Dyspnea (2009 since July -Sept), Enlarged prostate, Esophageal stricture, GERD (gastroesophageal reflux disease), Heart murmur, Hiatal hernia, History of carpal tunnel syndrome, History of kidney stones, History of PFTs, Hyperlipidemia, Hypertension, Interstitial lung disease ([place]), Iron deficiency anemia, Nausea & vomiting, On home oxygen therapy, Osteoporosis, Overweight (BMI 25.0-29.9), Peripheral neuropathy, RA (rheumatoid arthritis) ([place]), Seropositive rheumatoid arthritis ([place]), Type II diabetes mellitus ([place]), Wears glasses, and Wears partial dentures. He also  has a past surgical history that includes Total knee arthroplasty (Right, 03/2010); Knee arthroscopy (Right, 2008); Cataract extraction w/ intraocular lens  implant, bilateral (Bilateral); Coronary artery bypass graft (11/1999); Coronary stent placement (02/2012); Shoulder arthroscopy with open rotator cuff repair and distal clavicle acrominectomy (Left, 02/27/2013); Trigger finger release (Left, 02/27/2013); Cholecystectomy open (11/2003); Joint replacement; Esophagogastroduodenoscopy (egd) with esophageal dilation (2010); Cardiac catheterization (08/2004); Cardiac catheterization  (12/31/2011); Cardiac catheterization (2009); left and right heart catheterization with coronary angiogram (12/31/2011); percutaneous coronary stent intervention (pci-s) (12/31/2011); percutaneous coronary stent intervention (pci-s) (N/A, 01/03/2012); Percutaneous coronary intervention-balloon only (01/03/2012); left and right heart catheterization with coronary angiogram (N/A, 01/14/2014); Hand  surgery; Cardiac catheterization (N/A, 08/13/2015); Esophagogastroduodenoscopy (N/A, 03/01/2017); maloney dilation (03/01/2017); Esophagogastroduodenoscopy (egd) with propofol (N/A, 09/12/2018); Botox injection (N/A, 09/12/2018); Balloon dilation (N/A, 09/12/2018); Coronary angioplasty with stent (01/03/2012); Coronary angioplasty with stent (01/14/2014); Esophagogastroduodenoscopy (egd) with propofol (N/A, 11/27/2018); Botox injection (N/A, 11/27/2018); and Esophageal dilation (11/27/2018). [name] has a current medication list which includes the following prescription(s): aspirin ec, one touch ultra 2, carvedilol, clopidogrel, dha-vitamin c-lutein, furosemide, glucose blood, [START ON 11/17/2019] hydrocodone bitartrate er, hydrocodone-acetaminophen, losartan, nitroglycerin, ondansetron, onetouch delica lancets 72Z, oxygen-helium, pantoprazole, polyethylene glycol, tamsulosin, and amlodipine. He  reports that he quit smoking about 56 years ago. His smoking use included cigarettes. He has a 20.00 pack-year smoking history. He has never used smokeless tobacco. He reports that he does not drink alcohol and does not use drugs. Mr. Savini is allergic to doxazosin mesylate and methocarbamol.   HPI  Today, he is being contacted for a post-procedure assessment.   Post-Procedure Evaluation  Procedure (10/03/2019):   Type: Lumbar Facet, Medial Branch Block(s) #1  Primary Purpose: Diagnostic Region: Posterolateral Lumbosacral Spine Level:  L3, L4, L5,  Medial Branch Level(s). Injecting these levels blocks the L3-4, L4-5 lumbar facet  joints. Laterality: Right  Sedation: Please see nurses note.  Effectiveness during initial hour after procedure(Ultra-Short Term Relief): 100 %   Local anesthetic used: Long-acting (4-6 hours) Effectiveness: Defined as any analgesic benefit obtained secondary to the administration of local anesthetics. This carries significant diagnostic value as to the etiological location, or anatomical origin, of the pain. Duration of benefit is expected to coincide with the duration of the local anesthetic used.  Effectiveness during initial 4-6 hours after procedure(Short-Term Relief): 100 %   Long-term benefit: Defined as any relief past the pharmacologic duration of the local anesthetics.  Effectiveness past the initial 6 hours after procedure(Long-Term Relief): 100 % for 2 weeks  Current benefits: Defined as benefit that persist at this time.   Analgesia:  Back to baseline Function: Back to baseline ROM: Mr. Mcdiarmid reports improvement in ROM    Laboratory Chemistry Profile   Renal Lab Results  Component Value Date   BUN 33 (H) 06/13/2019   CREATININE 2.06 (H) 06/13/2019   BCR 13 06/24/2017   GFR 30.78 (L) 06/13/2019   GFRAA 30 (L) 06/24/2017   GFRNONAA 26 (L) 06/24/2017     Hepatic Lab Results  Component Value Date   AST 14 06/13/2019   ALT 9 06/13/2019   ALBUMIN 4.1 06/13/2019   ALBUMIN 4.1 06/13/2019   ALKPHOS 58 06/13/2019     Electrolytes Lab Results  Component Value Date   NA 133 (L) 06/13/2019   K 4.6 06/13/2019   CL 96 06/13/2019   CALCIUM 9.9 06/13/2019   MG 1.9 04/08/2017   PHOS 2.9 06/13/2019     Bone No results found for: VD25OH, VD125OH2TOT, DG6440HK7, QQ5956LO7, 25OHVITD1, 25OHVITD2, 25OHVITD3, TESTOFREE, TESTOSTERONE   Inflammation (CRP: Acute Phase) (ESR: Chronic Phase) Lab Results  Component Value Date   ESRSEDRATE 38 (H) 11/22/2011       Note: Above Lab results reviewed.   Assessment  The primary encounter diagnosis was Lumbar facet  arthropathy. Diagnoses of Lumbar spondylosis, Chronic pain syndrome, Neuropathic pain, Chronic pain of both lower extremities, and Diabetic polyneuropathy associated with type 2 diabetes mellitus ([place]) were also pertinent to this visit.  Plan of Care   Lumbar facet arthropathy, lumbar spondylosis: Status post positive diagnostic lumbar facet medial branch nerve block on the right at L3, L4, L5 medial branch that provided  100% pain relief for approximately 2 weeks with gradual return of pain thereafter.  Given positive diagnostic lumbar facet medial branch nerve block, recommend repeating block and then considering lumbar radiofrequency ablation for the purpose of obtaining long-term benefits.  Risks and benefits of procedure were reviewed again and patient would like to proceed.  Patient instructed to stop Plavix 7 days prior to procedure.  Patient states that he did experience discomfort with his last procedure so would like to do this 1 with IV sedation so instructed him to discontinue eating/drinking 8 hours prior to scheduled procedure and to bring a driver.  Patient endorsed understanding.  Orders:  Orders Placed This Encounter  Procedures  . LUMBAR FACET(MEDIAL BRANCH NERVE BLOCK) MBNB    Standing Status:   Future    Standing Expiration Date:   12/02/2019    Scheduling Instructions:     Procedure: Lumbar facet block (AKA.: Lumbosacral medial branch nerve block)     Side: Bilateral     Level: L3-4, L4-5 Facets (L3, L4, L5,  Medial Branch Nerves)     Sedation: with     Timeframe: ASAA          Stop Plavix 7 days prior    Order Specific Question:   Where will this procedure be performed?    Answer:   [facility]   Follow-up plan:   Return in about 2 weeks (around 11/15/2019) for R L3,4,5, Facets #2 (stop Plavix 7 days prior).   Recent Visits Date Type Provider Dept  10/03/19 Procedure visit Gillis Santa, MD [facility]  09/18/19 Office Visit Gillis Santa, MD  [facility]  Showing recent visits within past 90 days and meeting all other requirements Today's Visits Date Type Provider Dept  11/01/19 Telemedicine Gillis Santa, MD [facility]  Showing today's visits and meeting all other requirements Future Appointments Date Type Provider Dept  11/13/19 Appointment Gillis Santa, MD [facility]  Showing future appointments within next 90 days and meeting all other requirements  I discussed the assessment and treatment plan with the patient. The patient was provided an opportunity to ask questions and all were answered. The patient agreed with the plan and demonstrated an understanding of the instructions.  Patient advised to call back or seek an in-person evaluation if the symptoms or condition worsens.  Duration of encounter: 1mnutes.  Note by: BGillis Santa MD Date: 11/01/2019; Time: 3:52 PM

## 2019-11-02 DIAGNOSIS — D1801 Hemangioma of skin and subcutaneous tissue: Secondary | ICD-10-CM | POA: Diagnosis not present

## 2019-11-02 DIAGNOSIS — L821 Other seborrheic keratosis: Secondary | ICD-10-CM | POA: Diagnosis not present

## 2019-11-02 DIAGNOSIS — C44629 Squamous cell carcinoma of skin of left upper limb, including shoulder: Secondary | ICD-10-CM | POA: Diagnosis not present

## 2019-11-02 DIAGNOSIS — D485 Neoplasm of uncertain behavior of skin: Secondary | ICD-10-CM | POA: Diagnosis not present

## 2019-11-02 DIAGNOSIS — D225 Melanocytic nevi of trunk: Secondary | ICD-10-CM | POA: Diagnosis not present

## 2019-11-02 DIAGNOSIS — L570 Actinic keratosis: Secondary | ICD-10-CM | POA: Diagnosis not present

## 2019-11-06 ENCOUNTER — Telehealth: Payer: Self-pay | Admitting: Podiatry

## 2019-11-06 NOTE — Telephone Encounter (Signed)
Patient is requesting whether he is suppose to withhold his blood thinner due to the callus trim that he is suppose to have at his appointment on 11/08/19. Please advise. His phone number is [phone]

## 2019-11-06 NOTE — Telephone Encounter (Signed)
Please advise on this. Thanks 

## 2019-11-07 NOTE — Telephone Encounter (Signed)
He should remain on his medicine since I do not intend to cut him.  Hope He did not have a bleeding incident last time.  GAM

## 2019-11-08 ENCOUNTER — Encounter: Payer: Self-pay | Admitting: Podiatry

## 2019-11-08 ENCOUNTER — Ambulatory Visit: Payer: Medicare Other | Admitting: Podiatry

## 2019-11-08 ENCOUNTER — Other Ambulatory Visit: Payer: Self-pay

## 2019-11-08 DIAGNOSIS — E1142 Type 2 diabetes mellitus with diabetic polyneuropathy: Secondary | ICD-10-CM | POA: Diagnosis not present

## 2019-11-08 DIAGNOSIS — M216X2 Other acquired deformities of left foot: Secondary | ICD-10-CM | POA: Diagnosis not present

## 2019-11-08 DIAGNOSIS — L84 Corns and callosities: Secondary | ICD-10-CM | POA: Insufficient documentation

## 2019-11-08 DIAGNOSIS — N184 Chronic kidney disease, stage 4 (severe): Secondary | ICD-10-CM | POA: Diagnosis not present

## 2019-11-08 NOTE — Progress Notes (Signed)
This patient returns to my office for at risk foot care.  This patient requires this care by a professional since this patient will be at risk due to having neuropathy, CKD and diabetes.  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   He presents to the office with his wife.  This patient has a painful callus on the outside ball of his left foot.  No self treatment or professional care was noted.  This patient presents for at risk foot care today.  General Appearance  Alert, conversant and in no acute stress.  Vascular  Dorsalis pedis and posterior tibial  pulses are palpable  bilaterally.  Capillary return is within normal limits  bilaterally. Temperature is within normal limits  bilaterally.  Neurologic  Senn-Weinstein monofilament wire test absent  bilaterally. Muscle power within normal limits bilaterally.  Nails Thick disfigured discolored nails with subungual debris  from hallux to fifth toes bilaterally. No evidence of bacterial infection or drainage bilaterally.  Orthopedic  No limitations of motion  feet .  No crepitus or effusions noted.  No bony pathology or digital deformities noted. Plantar flexed fifth metatarsal left foot.  Skin  normotropic skin with no porokeratosis noted bilaterally.  No signs of infections or ulcers noted.   Callus sub 5th metatarsal left foot.  Callus due to plantarflexed fifth metatarsal left foot.  Consent was obtained for treatment procedures.   Debridement of callus with # 15 blade.   Return office visit    prn                 Told patient to return for periodic foot care and evaluation due to potential at risk complications.   Gardiner Barefoot DPM

## 2019-11-13 ENCOUNTER — Other Ambulatory Visit: Payer: Self-pay

## 2019-11-13 ENCOUNTER — Ambulatory Visit
Payer: Medicare Other | Attending: Pain Medicine | Admitting: Student in an Organized Health Care Education/Training Program

## 2019-11-13 ENCOUNTER — Encounter: Payer: Self-pay | Admitting: Student in an Organized Health Care Education/Training Program

## 2019-11-13 DIAGNOSIS — M792 Neuralgia and neuritis, unspecified: Secondary | ICD-10-CM | POA: Diagnosis not present

## 2019-11-13 DIAGNOSIS — G894 Chronic pain syndrome: Secondary | ICD-10-CM | POA: Diagnosis not present

## 2019-11-13 DIAGNOSIS — E1142 Type 2 diabetes mellitus with diabetic polyneuropathy: Secondary | ICD-10-CM | POA: Diagnosis not present

## 2019-11-13 MED ORDER — HYDROCODONE-ACETAMINOPHEN 10-325 MG PO TABS: 1.0000 | ORAL_TABLET | ORAL | 0 refills | Status: DC | PRN

## 2019-11-13 MED ORDER — ALPHA-LIPOIC ACID 600 MG PO CAPS: 600.0000 mg | ORAL_CAPSULE | Freq: Every day | ORAL | 2 refills | Status: AC

## 2019-11-13 MED ORDER — HYDROCODONE-ACETAMINOPHEN 10-325 MG PO TABS: 1.0000 | ORAL_TABLET | ORAL | 0 refills | Status: AC | PRN

## 2019-11-13 NOTE — Patient Instructions (Signed)
You have 3 prescriptions for Hydrocodone and 1 for Alphalipoic acid

## 2019-11-13 NOTE — Progress Notes (Signed)
PROVIDER NOTE: Information contained herein reflects review and annotations entered in association with encounter. Interpretation of such information and data should be left to medically-trained personnel. Information provided to patient can be located elsewhere in the medical record under "Patient Instructions". Document created using STT-dictation technology, any transcriptional errors that may result from process are unintentional.    Patient: [name]  Service Category: E/M  Provider: Gillis Santa, MD  DOB: 17-Jun-1931  DOS: 11/13/2019  Specialty: Interventional Pain Management  MRN: [id number]  Setting: Ambulatory outpatient  PCP: [name], MD  Type: Established Patient    Referring Provider: [name], MD  Location: Office  Delivery: Face-to-face     HPI  Reason for encounter: [name], a 86 y.o. year old male, is here today for evaluation and management of his No primary diagnosis found.. [name] primary complain today is Foot Pain (bilat) and Hand Pain (bilat) Last encounter: Practice (10/03/2019). My last encounter with him was on 10/03/2019. Pertinent problems: [name] has CKD (chronic kidney disease) stage 4, GFR 15-29 ml/min ([place]); Spinal stenosis of lumbar region without neurogenic claudication; Lumbar spondylosis; Chronic pain of both lower extremities; Neuropathic pain; Chronic pain syndrome; and CKD stage 3 due to type 2 diabetes mellitus ([name]) on their pertinent problem list. Pain Assessment: Severity of Chronic pain is reported as a 2 /10. Location: Foot Right, Left/denies; HAND PAIN does not radiate. Onset: More than a month ago. Quality: Aching, Burning. Timing: Intermittent. Modifying factor(s): meds. Vitals:  height is 6' (1.829 m) and weight is 192 lb (87.1 kg). His temporal temperature is 97.2 F (36.2 C) (abnormal). His blood pressure is 115/56 (abnormal) and his pulse is 63. His respiration is 16 and oxygen saturation is 95%.   At  patient's last visit, hydrocodone extended release was prescribed however it was not covered by insurance.  Otherwise no change in medical history.  Patient is still experiencing pain relief from his diagnostic lumbar facet medial branch nerve blocks that were done couple of months ago.  Can repeat as needed and pursue RFA thereafter his second diagnostic facet block.  We will refill hydrocodone as below.  Recommend patient start alpha lipoic acid for neuropathic pain.  Pharmacotherapy Assessment   10/18/2019  1   09/18/2019  Hydrocodone-Acetamin 10-325 MG  150.00  25 Bi Lat   [id number]   Gib (4800)   0/0  60.00 MME  Medicare   [place]     Analgesic: Norco 10 mg every 4 to 6 hours as needed, maximum 5 a day MME equals 60  Monitoring: Strong PMP: PDMP reviewed during this encounter.       Pharmacotherapy: No side-effects or adverse reactions reported. Compliance: No problems identified. Effectiveness: Clinically acceptable.  Rise Patience, RN  11/13/2019 10:34 AM  Sign when Signing Visit Nursing Pain Medication Assessment:  Safety precautions to be maintained throughout the outpatient stay will include: orient to surroundings, keep bed in low position, maintain call bell within reach at all times, provide assistance with transfer out of bed and ambulation.  Medication Inspection Compliance: Pill count conducted under aseptic conditions, in front of the patient. Neither the pills nor the bottle was removed from the patient's sight at any time. Once count was completed pills were immediately returned to the patient in their original bottle.  Medication: Hydrocodone/APAP Pill/Patch Count: 3 of 150 pills remain Pill/Patch Appearance: Markings consistent with prescribed medication Bottle Appearance: Standard pharmacy container. Clearly labeled. Filled Date: 07 / 82 /  2021 Last Medication intake:  Today    UDS:  Summary  Date Value Ref Range Status  09/18/2019 Note  Final    Comment:     ==================================================================== [employer] [facility] (MW) ==================================================================== Test                             Result       Flag       Units  Drug Present and Declared for Prescription Verification   Hydrocodone                    1687         EXPECTED   ng/mg creat   Hydromorphone                  2436         EXPECTED   ng/mg creat   Dihydrocodeine                 436          EXPECTED   ng/mg creat   Norhydrocodone                 1384         EXPECTED   ng/mg creat    Sources of hydrocodone include scheduled prescription medications.    Hydromorphone, dihydrocodeine and norhydrocodone are expected    metabolites of hydrocodone. Hydromorphone and dihydrocodeine are    also available as scheduled prescription medications.  ==================================================================== Test                      Result    Flag   Units      Ref Range   Creatinine              45               mg/dL      >=20 ==================================================================== Declared Medications:  The flagging and interpretation on this report are based on the  following declared medications.  Unexpected results may arise from  inaccuracies in the declared medications.   **Note: The testing scope of this panel includes these medications:   Hydrocodone (Norco)  Hydrocodone   **Note: The testing scope of this panel does not include the  following reported medications:   Acetaminophen (Norco)  Amlodipine (Norvasc)  Aspirin  Carvedilol (Coreg)  Clopidogrel (Plavix)  Furosemide (Lasix)  Helium  Losartan (Cozaar)  Nitroglycerin (Nitrostat)  Ondansetron (Zofran)  Oxygen  Pantoprazole (Protonix)  Polyethylene Glycol  Tamsulosin (Flomax) ==================================================================== For clinical consultation, please call [phone]. ====================================================================      ROS  Constitutional: Denies any fever or chills Gastrointestinal: No reported hemesis, hematochezia, vomiting, or acute GI distress Musculoskeletal: Denies any acute onset joint swelling, redness, loss of ROM, or weakness Neurological: No reported episodes of acute onset apraxia, aphasia, dysarthria, agnosia, amnesia, paralysis, loss of coordination, or loss of consciousness  Medication Review  Alpha-Lipoic Acid, DHA-Vitamin C-Lutein, HYDROcodone-acetaminophen, ONE TOUCH ULTRA 2, OneTouch Delica Lancets 81E, Oxygen-Helium, amLODipine, aspirin EC, carvedilol, clopidogrel, furosemide, glucose blood, losartan, nitroGLYCERIN, ondansetron, pantoprazole, polyethylene glycol, and tamsulosin  History Review  Allergy: [name] is allergic to doxazosin mesylate and methocarbamol. Drug: [name]  reports no history of drug use. Alcohol:  reports no history of alcohol use. Tobacco:  reports that he quit smoking about 56 years ago. His smoking use included cigarettes. He has a 20.00 pack-year  smoking history. He has never used smokeless tobacco. Social: [name]  reports that he quit smoking about 56 years ago. His smoking use included cigarettes. He has a 20.00 pack-year smoking history. He has never used smokeless tobacco. He reports that he does not drink alcohol and does not use drugs. Medical:  has a past medical history of Arthritis, CAD (coronary artery disease), Cataract, Chronic diastolic CHF (congestive heart failure) ([place]), Chronic kidney disease, stage III (moderate), Chronic lower back pain, Colon polyps, COVID-19, Diverticulosis, Dyspnea (2009 since July -Sept), Enlarged prostate, Esophageal stricture, GERD (gastroesophageal reflux disease), Heart murmur, Hiatal hernia, History of carpal tunnel syndrome, History of kidney stones, History of PFTs, Hyperlipidemia, Hypertension, Interstitial lung disease  ([place]), Iron deficiency anemia, Nausea & vomiting, On home oxygen therapy, Osteoporosis, Overweight (BMI 25.0-29.9), Peripheral neuropathy, RA (rheumatoid arthritis) ([place]), Seropositive rheumatoid arthritis ([place]), Type II diabetes mellitus ([place]), Wears glasses, and Wears partial dentures. Surgical: [name]  has a past surgical history that includes Total knee arthroplasty (Right, 03/2010); Knee arthroscopy (Right, 2008); Cataract extraction w/ intraocular lens  implant, bilateral (Bilateral); Coronary artery bypass graft (11/1999); Coronary stent placement (02/2012); Shoulder arthroscopy with open rotator cuff repair and distal clavicle acrominectomy (Left, 02/27/2013); Trigger finger release (Left, 02/27/2013); Cholecystectomy open (11/2003); Joint replacement; Esophagogastroduodenoscopy (egd) with esophageal dilation (2010); Cardiac catheterization (08/2004); Cardiac catheterization (12/31/2011); Cardiac catheterization (2009); left and right heart catheterization with coronary angiogram (12/31/2011); percutaneous coronary stent intervention (pci-s) (12/31/2011); percutaneous coronary stent intervention (pci-s) (N/A, 01/03/2012); Percutaneous coronary intervention-balloon only (01/03/2012); left and right heart catheterization with coronary angiogram (N/A, 01/14/2014); Hand surgery; Cardiac catheterization (N/A, 08/13/2015); Esophagogastroduodenoscopy (N/A, 03/01/2017); maloney dilation (03/01/2017); Esophagogastroduodenoscopy (egd) with propofol (N/A, 09/12/2018); Botox injection (N/A, 09/12/2018); Balloon dilation (N/A, 09/12/2018); Coronary angioplasty with stent (01/03/2012); Coronary angioplasty with stent (01/14/2014); Esophagogastroduodenoscopy (egd) with propofol (N/A, 11/27/2018); Botox injection (N/A, 11/27/2018); and Esophageal dilation (11/27/2018). Family: family history includes Alcohol abuse in his sister; COPD in his mother; Colon cancer (age of onset: 70) in his brother; Diabetes in his brother; Heart attack  in his father; Heart disease in his father; Stomach cancer in his brother; Stroke in his sister.  Laboratory Chemistry Profile   Renal Lab Results  Component Value Date   BUN 33 (H) 06/13/2019   CREATININE 2.06 (H) 06/13/2019   BCR 13 06/24/2017   GFR 30.78 (L) 06/13/2019   GFRAA 30 (L) 06/24/2017   GFRNONAA 26 (L) 06/24/2017     Hepatic Lab Results  Component Value Date   AST 14 06/13/2019   ALT 9 06/13/2019   ALBUMIN 4.1 06/13/2019   ALBUMIN 4.1 06/13/2019   ALKPHOS 58 06/13/2019     Electrolytes Lab Results  Component Value Date   NA 133 (L) 06/13/2019   K 4.6 06/13/2019   CL 96 06/13/2019   CALCIUM 9.9 06/13/2019   MG 1.9 04/08/2017   PHOS 2.9 06/13/2019     Bone No results found for: VD25OH, VD125OH2TOT, RX5400QQ7, YP9509TO6, 25OHVITD1, 25OHVITD2, 25OHVITD3, TESTOFREE, TESTOSTERONE   Inflammation (CRP: Acute Phase) (ESR: Chronic Phase) Lab Results  Component Value Date   ESRSEDRATE 38 (H) 11/22/2011       Note: Above Lab results reviewed.  Recent Imaging Review  DG PAIN CLINIC C-ARM 1-60 MIN NO REPORT Fluoro was used, but no Radiologist interpretation will be provided.  Please refer to "NOTES" tab for provider progress note. Note: Reviewed        Physical Exam  General appearance: Well nourished, well developed, and well hydrated. In no apparent  acute distress Mental status: Alert, oriented x 3 (person, place, & time)       Respiratory: No evidence of acute respiratory distress Eyes: PERLA Vitals: BP (!) 115/56   Pulse 63   Temp (!) 97.2 F (36.2 C) (Temporal)   Resp 16   Ht 6' (1.829 m)   Wt 192 lb (87.1 kg)   SpO2 95%   BMI 26.04 kg/m  BMI: Estimated body mass index is 26.04 kg/m as calculated from the following:   Height as of this encounter: 6' (1.829 m).   Weight as of this encounter: 192 lb (87.1 kg). Ideal: Ideal body weight: 77.6 kg (171 lb 1.2 oz) Adjusted ideal body weight: 81.4 kg (179 lb 7.1 oz)  Lumbar Spine Area Exam  Skin &  Axial Inspection: No masses, redness, or swelling Alignment: Symmetrical Functional ROM: Decreased ROM , slightly improved after treatment      Stability: No instability detected Muscle Tone/Strength: Functionally intact. No obvious neuro-muscular anomalies detected. Sensory (Neurological): Musculoskeletal pain pattern, improved after treatment Palpation: No palpable anomalies       Provocative Tests: Hyperextension/rotation test: (+) bilaterally for facet joint pain. Lumbar quadrant test (Kemp's test): (+) bilaterally for facet joint pain.  Gait & Posture Assessment  Ambulation: Unassisted Gait: Relatively normal for age and body habitus Posture: WNL  Lower Extremity Exam    Side: Right lower extremity  Side: Left lower extremity  Stability: No instability observed          Stability: No instability observed          Skin & Extremity Inspection: Skin color, temperature, and hair growth are WNL. No peripheral edema or cyanosis. No masses, redness, swelling, asymmetry, or associated skin lesions. No contractures.  Skin & Extremity Inspection: Skin color, temperature, and hair growth are WNL. No peripheral edema or cyanosis. No masses, redness, swelling, asymmetry, or associated skin lesions. No contractures.  Functional ROM: Decreased ROM                  Functional ROM: Decreased ROM                  Muscle Tone/Strength: Functionally intact. No obvious neuro-muscular anomalies detected.  Muscle Tone/Strength: Functionally intact. No obvious neuro-muscular anomalies detected.  Sensory (Neurological): Neuropathic pain pattern        Sensory (Neurological): Neuropathic pain pattern        DTR: Patellar: deferred today Achilles: deferred today Plantar: deferred today  DTR: Patellar: deferred today Achilles: deferred today Plantar: deferred today  Palpation: No palpable anomalies  Palpation: No palpable anomalies     Assessment   Status Diagnosis   Controlled Controlled Controlled 1. Neuropathic pain   2. Diabetic polyneuropathy associated with type 2 diabetes mellitus ([place])   3. Chronic pain syndrome        Plan of Care   [name] has a current medication list which includes the following long-term medication(s): furosemide, nitroglycerin, pantoprazole, and carvedilol.  Pharmacotherapy (Medications Ordered): Meds ordered this encounter  Medications  . HYDROcodone-acetaminophen (NORCO) 10-325 MG tablet    Sig: Take 1 tablet by mouth every 4 (four) hours as needed for severe pain. Must last 30 days.    Dispense:  150 tablet    Refill:  0    Chronic Pain. (STOP Act - Not applicable). Fill one day early if closed on scheduled refill date.  [name] HYDROcodone-acetaminophen (NORCO) 10-325 MG tablet    Sig: Take 1 tablet by mouth  every 4 (four) hours as needed for severe pain. Must last 30 days.    Dispense:  150 tablet    Refill:  0    Chronic Pain. (STOP Act - Not applicable). Fill one day early if closed on scheduled refill date.  [name] HYDROcodone-acetaminophen (NORCO) 10-325 MG tablet    Sig: Take 1 tablet by mouth every 4 (four) hours as needed for severe pain. Must last 30 days.    Dispense:  150 tablet    Refill:  0    Chronic Pain. (STOP Act - Not applicable). Fill one day early if closed on scheduled refill date.  . Alpha-Lipoic Acid 600 MG CAPS    Sig: Take 1 capsule (600 mg total) by mouth daily.    Dispense:  30 capsule    Refill:  2    Do not place medication on "Automatic Refill". Fill one day early if pharmacy is closed on scheduled refill date.   Follow-up plan:   Return in about 3 months (around 02/13/2020) for Medication Management, in person.   Recent Visits Date Type Provider Dept  11/01/19 Telemedicine Gillis Santa, MD [facility]  10/03/19 Procedure visit Gillis Santa, MD [facility]  09/18/19 Office Visit Gillis Santa, MD [facility]  Showing recent visits within  past 90 days and meeting all other requirements Today's Visits Date Type Provider Dept  11/13/19 Office Visit Gillis Santa, MD [facility]  Showing today's visits and meeting all other requirements Future Appointments No visits were found meeting these conditions. Showing future appointments within next 90 days and meeting all other requirements  I discussed the assessment and treatment plan with the patient. The patient was provided an opportunity to ask questions and all were answered. The patient agreed with the plan and demonstrated an understanding of the instructions.  Patient advised to call back or seek an in-person evaluation if the symptoms or condition worsens.  Duration of encounter: 30 minutes.  Note by: Gillis Santa, MD Date: 11/13/2019; Time: 12:58 PM

## 2019-11-13 NOTE — Progress Notes (Signed)
Nursing Pain Medication Assessment:  Safety precautions to be maintained throughout the outpatient stay will include: orient to surroundings, keep bed in low position, maintain call bell within reach at all times, provide assistance with transfer out of bed and ambulation.  Medication Inspection Compliance: Pill count conducted under aseptic conditions, in front of the patient. Neither the pills nor the bottle was removed from the patient's sight at any time. Once count was completed pills were immediately returned to the patient in their original bottle.  Medication: Hydrocodone/APAP Pill/Patch Count: 3 of 150 pills remain Pill/Patch Appearance: Markings consistent with prescribed medication Bottle Appearance: Standard pharmacy container. Clearly labeled. Filled Date: 07 / 15 / 2021 Last Medication intake:  Today

## 2019-11-14 ENCOUNTER — Ambulatory Visit: Payer: Medicare Other | Admitting: Pain Medicine

## 2019-11-15 ENCOUNTER — Telehealth: Payer: Self-pay | Admitting: Internal Medicine

## 2019-11-15 NOTE — Telephone Encounter (Signed)
Notified --ok to increase Rx Carvedilol 3.125 mg to 6.25 mg by Dr. Silvio Pate .

## 2019-11-15 NOTE — Telephone Encounter (Signed)
Okay to increase the carvedilol if the nephrologist requested that

## 2019-11-15 NOTE — Telephone Encounter (Signed)
Chris from [facility] called stating patients Nephrologist has prescribed the Carvedilol for 6.25 MG but patients already prescribed for 3.125 MG of the Carvedilol by Dr.[name]. He would like Dr.[name] to give him a call asap at [phone].

## 2019-11-16 ENCOUNTER — Telehealth: Payer: Self-pay

## 2019-11-16 NOTE — Telephone Encounter (Signed)
Lefa message on VM at the pharmacy that we had already addressed this yesterday. He is to take the 6.25 mg the nephrologist wrote.

## 2019-11-16 NOTE — Telephone Encounter (Signed)
Tried to call pharmacy but line was busy. Per message from yesterday, it was fine for him to take the 6.25mg  prescribed from cardiology. Our rx was from 10-04-19.

## 2019-11-16 NOTE — Telephone Encounter (Signed)
Landon with [facility] left v/m that pt said that nephrologist changed Carvedilol to 6.25 mg and got new rx from our office for Carvedilol 3.125 mg; Landon request cb with which dosage is correct.

## 2019-11-20 ENCOUNTER — Other Ambulatory Visit: Payer: Self-pay | Admitting: Student in an Organized Health Care Education/Training Program

## 2019-11-20 ENCOUNTER — Telehealth: Payer: Self-pay | Admitting: *Deleted

## 2019-11-20 MED ORDER — XTAMPZA ER 9 MG PO C12A: 1.0000 | EXTENDED_RELEASE_CAPSULE | Freq: Two times a day (BID) | ORAL | 0 refills | Status: AC

## 2019-11-20 MED ORDER — XTAMPZA ER 9 MG PO C12A: 1.0000 | EXTENDED_RELEASE_CAPSULE | Freq: Two times a day (BID) | ORAL | 0 refills | Status: DC

## 2019-11-20 NOTE — Progress Notes (Signed)
Patient's insurance company did not approve extended release hydrocodone.  Out of long-acting analgesics that his insurance company would approve, Extampza likely has a better side effect profile and we can initiate at the lowest dose.  PMP checked and appropriate.  Please see below for prescription.  Requested Prescriptions   Signed Prescriptions Disp Refills  . oxyCODONE ER (XTAMPZA ER) 9 MG C12A 60 capsule 0    Sig: Take 1 capsule by mouth every 12 (twelve) hours. For chronic pain syndrome. Long acting opioid analgesic.  [name] oxyCODONE ER (XTAMPZA ER) 9 MG C12A 60 capsule 0    Sig: Take 1 capsule by mouth every 12 (twelve) hours. For chronic pain syndrome. Long acting opioid analgesic.  [name] oxyCODONE ER (XTAMPZA ER) 9 MG C12A 60 capsule 0    Sig: Take 1 capsule by mouth every 12 (twelve) hours. For chronic pain syndrome. Long acting opioid analgesic.

## 2019-11-20 NOTE — Telephone Encounter (Signed)
Called pharmacy to cancel script for Hydrocodone ER because not covered by insurance. Informed them of new scripts for [facility] ER. Attempted to call patient at all numbers listed, unable to reach patient.

## 2019-11-21 ENCOUNTER — Telehealth: Payer: Self-pay | Admitting: *Deleted

## 2019-11-21 NOTE — Telephone Encounter (Signed)
Spoke with patient, informed him that his insurance will not cover Hydrocodone ER, and that Dr. [name] has prescribed Xtampza. Instructed patient on how to take it, and possible side effects.

## 2019-12-14 ENCOUNTER — Ambulatory Visit (INDEPENDENT_AMBULATORY_CARE_PROVIDER_SITE_OTHER): Payer: Medicare Other | Admitting: Internal Medicine

## 2019-12-14 ENCOUNTER — Encounter: Payer: Self-pay | Admitting: Internal Medicine

## 2019-12-14 ENCOUNTER — Other Ambulatory Visit: Payer: Self-pay

## 2019-12-14 VITALS — BP 114/64 | HR 60 | Temp 97.9°F | Ht 72.0 in | Wt 200.0 lb

## 2019-12-14 DIAGNOSIS — F1120 Opioid dependence, uncomplicated: Secondary | ICD-10-CM | POA: Diagnosis not present

## 2019-12-14 DIAGNOSIS — E1140 Type 2 diabetes mellitus with diabetic neuropathy, unspecified: Secondary | ICD-10-CM

## 2019-12-14 DIAGNOSIS — I5032 Chronic diastolic (congestive) heart failure: Secondary | ICD-10-CM | POA: Diagnosis not present

## 2019-12-14 LAB — POCT GLYCOSYLATED HEMOGLOBIN (HGB A1C): Hemoglobin A1C: 8.1 % — AB (ref 4.0–5.6)

## 2019-12-14 MED ORDER — SEMAGLUTIDE 3 MG PO TABS: 3.0000 mg | ORAL_TABLET | Freq: Every day | ORAL | 11 refills | Status: DC

## 2019-12-14 NOTE — Assessment & Plan Note (Signed)
Finally getting some relief from the severe neuropathy pain Managed by Dr [name]

## 2019-12-14 NOTE — Patient Instructions (Signed)
Please start the new prescription (semaglutide 3mg ) about 30 minutes before breakfast. Stop it if you have any problems (like nausea, diarrhea, loss of appetite)

## 2019-12-14 NOTE — Assessment & Plan Note (Signed)
Compensated Carvedilol, furosemide, losartan

## 2019-12-14 NOTE — Progress Notes (Signed)
Subjective:    Patient ID: [name], male    DOB: Feb 14, 1932, 86 y.o.   MRN: [id number]  HPI Here for follow up of diabetes and other chronic health conditions With wife This visit occurred during the SARS-CoV-2 public health emergency.  Safety protocols were in place, including screening questions prior to the visit, additional usage of staff PPE, and extensive cleaning of exam room while observing appropriate contact time as indicated for disinfecting solutions.   Doing okay Checking sugars ~weekly Running high--- 190 and some over 200 Off the metformin--- seemed to be making him sick  Ongoing neuropathy pain Now more in hands Working with pain doctor and is better Hydrocodone/oxycodone   No chest pain No SOB  RA seems to be controlled  No Rx now  Current Outpatient Medications on File Prior to Visit  Medication Sig Dispense Refill  . Alpha-Lipoic Acid 600 MG CAPS Take 1 capsule (600 mg total) by mouth daily. 30 capsule 2  . amLODipine (NORVASC) 5 MG tablet Take 5 mg by mouth daily.     [name] aspirin EC 81 MG tablet Take 81 mg by mouth at bedtime.    . Blood Glucose Monitoring Suppl (ONE TOUCH ULTRA 2) w/Device KIT Use to obtain blood sugar daily. Dx Code E11.40 1 kit 0  . carvedilol (COREG) 6.25 MG tablet Take 6.25 mg by mouth 2 (two) times daily.    . clopidogrel (PLAVIX) 75 MG tablet Take 1 tablet (75 mg total) by mouth daily. Must schedule overdue follow up appt  for further refills 30 tablet 0  . DHA-Vitamin C-Lutein (EYE HEALTH FORMULA PO) Take 1 tablet by mouth daily.     . furosemide (LASIX) 40 MG tablet Take 40 mg by mouth at bedtime. Monday, Wednesday, Friday.    [name] glucose blood (ONE TOUCH ULTRA TEST) test strip USE TO CHECK BLOOD SUGAR ONCE A DAY Dx Code E11.40 100 each 3  . HYDROcodone-acetaminophen (NORCO) 10-325 MG tablet Take 1 tablet by mouth every 4 (four) hours as needed for severe pain. Must last 30 days. 150 tablet 0  . [START ON 12/15/2019]  HYDROcodone-acetaminophen (NORCO) 10-325 MG tablet Take 1 tablet by mouth every 4 (four) hours as needed for severe pain. Must last 30 days. 150 tablet 0  . [START ON 01/14/2020] HYDROcodone-acetaminophen (NORCO) 10-325 MG tablet Take 1 tablet by mouth every 4 (four) hours as needed for severe pain. Must last 30 days. 150 tablet 0  . losartan (COZAAR) 25 MG tablet Take 25 mg by mouth daily.    . nitroGLYCERIN (NITROSTAT) 0.4 MG SL tablet DISSOLVE 1 TABLET UNDER TONGUE AS NEEDEDFOR CHEST PAIN. MAY REPEAT 5 MINUTES APART 3 TIMES IF NEEDED. IF NO RELIEF CALL 911 25 tablet 0  . ondansetron (ZOFRAN) 4 MG tablet TAKE 1 TABLET BY MOUTH EVERY 8 HOURS AS NEEDED FOR NAUSEA OR VOMITING 30 tablet 1  . ONETOUCH DELICA LANCETS 13K MISC 1 each by In Vitro route daily. Dx Code E11.49 100 each 3  . oxyCODONE ER (XTAMPZA ER) 9 MG C12A Take 1 capsule by mouth every 12 (twelve) hours. For chronic pain syndrome. Long acting opioid analgesic. 60 capsule 0  . [START ON 12/20/2019] oxyCODONE ER (XTAMPZA ER) 9 MG C12A Take 1 capsule by mouth every 12 (twelve) hours. For chronic pain syndrome. Long acting opioid analgesic. 60 capsule 0  . [START ON 01/19/2020] oxyCODONE ER (XTAMPZA ER) 9 MG C12A Take 1 capsule by mouth every 12 (twelve) hours. For chronic  pain syndrome. Long acting opioid analgesic. 60 capsule 0  . OXYGEN Inhale into the lungs. Per pt- Uses Oxygen 2 liters at night.    . pantoprazole (PROTONIX) 40 MG tablet Take 1 tablet (40 mg total) by mouth daily. 90 tablet 3  . polyethylene glycol (MIRALAX / GLYCOLAX) 17 g packet Take 51 g by mouth daily.     . tamsulosin (FLOMAX) 0.4 MG CAPS capsule Take 0.4 mg by mouth daily.     No current facility-administered medications on file prior to visit.    Allergies  Allergen Reactions  . Doxazosin Mesylate Other (See Comments)    dizziness  . Methocarbamol Rash    Past Medical History:  Diagnosis Date  . Arthritis    osteoarthritis, s/p R TKR, and digits  . CAD  (coronary artery disease)    a. s/p CABG (2001)  b. s/p DES to RCA and cutting POBA to ostial PDA (2013)   c. s/p DES to SVG to OM2 (01/14/14) d. cath: 08/2015 NSTEMI w/ patent LIMA-LAD and 99% stenosis of SVG-OM w/ DES placed. CTO of SVG-RCA and SVG-D1.   . Cataract   . Chronic diastolic CHF (congestive heart failure) ([place])    a) 09/13 ECHO- LVEF 94-76%, grade 1 diastolic dysfunction, mild LA dilatation, atrial septal aneurysm, AV mobility restricted, but no sig AS by doppler; b) 09/04/08 ECHO- LVH, ef 60%, mild AS, c. echo 08/2015: EF perserved of 55-60% with inferolateral HK. Mild AS noted.  . Chronic kidney disease, stage III (moderate)   . Chronic lower back pain   . Colon polyps   . COVID-19   . Diverticulosis   . Dyspnea 2009 since July -Sept   05/06/08-CPST-  normal effort, reduced VO2 max 20.5 /65%, reduced at 8.2/ 40%, normal breathing resetvca of 55%, submaximal heart rate response 112/77%, flattened o2 pluse response at peak exercise-12 ml/beat @ 85%, No VQ mismatch abnormalities, All c/w CIRC Limitation  . Enlarged prostate   . Esophageal stricture    a. s/p dilation spring 2010  . GERD (gastroesophageal reflux disease)   . Heart murmur   . Hiatal hernia   . History of carpal tunnel syndrome    Bilateral  . History of kidney stones   . History of PFTs    mixed pattern on spiro. mild restn on lung volumes with near normal DLCO. Pattern can be explained by CABG scar. Fev1 2.2L/73%, ratio 68 (67), TLC 4.7/68%,RV 1.5L/55%,DLCO 79%  . Hyperlipidemia   . Hypertension   . Interstitial lung disease ([name])    NOS  . Iron deficiency anemia   . Nausea & vomiting    2018/2019  . On home oxygen therapy    2 L [place] at bedtime  . Osteoporosis   . Overweight (BMI 25.0-29.9)    BMI 29  . Peripheral neuropathy   . RA (rheumatoid arthritis) ([name])    [name]  . Seropositive rheumatoid arthritis ([place])   . Type II diabetes mellitus ([name])    diet controlled  . Wears glasses   . Wears  partial dentures    upper    Past Surgical History:  Procedure Laterality Date  . BALLOON DILATION N/A 09/12/2018   Procedure: BALLOON DILATION;  Surgeon: Irene Shipper, MD;  Location: [facility];  Service: Endoscopy;  Laterality: N/A;  . BOTOX INJECTION N/A 09/12/2018   Procedure: BOTOX INJECTION;  Surgeon: Irene Shipper, MD;  Location: [facility];  Service: Endoscopy;  Laterality: N/A;  . BOTOX INJECTION  N/A 11/27/2018   Procedure: BOTOX INJECTION;  Surgeon: Irene Shipper, MD;  Location: [facility];  Service: Endoscopy;  Laterality: N/A;  . CARDIAC CATHETERIZATION  08/2004   CP- no MI, Cath- small vessell disease   . CARDIAC CATHETERIZATION  12/31/2011   80% distal LM, 100% native LAD, LCx and RCA, 30% prox SVG-OM, SVG-D1 normal, 99% distal, 80% ostial SVG-RCA distal to graft, LIMA-LAD normal; LVEF mildly decreased with posterior basal AK   . CARDIAC CATHETERIZATION  2009   with patent grafts/notes 12/31/2011  . CARDIAC CATHETERIZATION N/A 08/13/2015   Procedure: Left Heart Cath and Cors/Grafts Angiography;  Surgeon: Sherren Mocha, MD;  Location: [facility];  Service: Cardiovascular;  Laterality: N/A;  . CATARACT EXTRACTION W/ INTRAOCULAR LENS  IMPLANT, BILATERAL Bilateral   . CHOLECYSTECTOMY OPEN  11/2003   [name]  . CORONARY ANGIOPLASTY WITH STENT PLACEMENT  01/03/2012   Successful DES to SVG-RCA and cutting balloon angioplasty ostial  PDA   . CORONARY ANGIOPLASTY WITH STENT PLACEMENT  01/14/2014   "1"  . CORONARY ARTERY BYPASS GRAFT  11/1999   CABG X5  . CORONARY STENT PLACEMENT  02/2012   1 stent and balloon  . ESOPHAGEAL DILATION  11/27/2018   Procedure: ESOPHAGEAL DILATION;  Surgeon: Irene Shipper, MD;  Location: [facility];  Service: Endoscopy;;  . ESOPHAGOGASTRODUODENOSCOPY N/A 03/01/2017   Procedure: ESOPHAGOGASTRODUODENOSCOPY (EGD);  Surgeon: Irene Shipper, MD;  Location: [facility];  Service: Endoscopy;  Laterality: N/A;  . ESOPHAGOGASTRODUODENOSCOPY (EGD) WITH  ESOPHAGEAL DILATION  2010  . ESOPHAGOGASTRODUODENOSCOPY (EGD) WITH PROPOFOL N/A 09/12/2018   Procedure: ESOPHAGOGASTRODUODENOSCOPY (EGD) WITH PROPOFOL;  Surgeon: Irene Shipper, MD;  Location: [facility];  Service: Endoscopy;  Laterality: N/A;  . ESOPHAGOGASTRODUODENOSCOPY (EGD) WITH PROPOFOL N/A 11/27/2018   Procedure: ESOPHAGOGASTRODUODENOSCOPY (EGD) WITH PROPOFOL, WITH BALLOON DILATION;  Surgeon: Irene Shipper, MD;  Location: [facility];  Service: Endoscopy;  Laterality: N/A;  . HAND SURGERY     bilateral carpal tunnel releases  . JOINT REPLACEMENT    . KNEE ARTHROSCOPY Right 2008  . LEFT AND RIGHT HEART CATHETERIZATION WITH CORONARY ANGIOGRAM  12/31/2011   Procedure: LEFT AND RIGHT HEART CATHETERIZATION WITH CORONARY ANGIOGRAM;  Surgeon: Burnell Blanks, MD;  Location: [facility] C[facility] LAB;  Service: Cardiovascular;;  . LEFT AND RIGHT HEART CATHETERIZATION WITH CORONARY ANGIOGRAM N/A 01/14/2014   Procedure: LEFT AND RIGHT HEART CATHETERIZATION WITH CORONARY ANGIOGRAM;  Surgeon: Peter M Martinique, MD;  Location: [facility] CATH LAB;  Service: Cardiovascular;  Laterality: N/A;  . MALONEY DILATION  03/01/2017   Procedure: Venia Minks DILATION;  Surgeon: Irene Shipper, MD;  Location: [facility];  Service: Endoscopy;;  . PERCUTANEOUS CORONARY INTERVENTION-BALLOON ONLY  01/03/2012   Procedure: PERCUTANEOUS CORONARY INTERVENTION-BALLOON ONLY;  Surgeon: Peter M Martinique, MD;  Location: [facility] CATH LAB;  Service: Cardiovascular;;  . PERCUTANEOUS CORONARY STENT INTERVENTION (PCI-S)  12/31/2011   Procedure: PERCUTANEOUS CORONARY STENT INTERVENTION (PCI-S);  Surgeon: Burnell Blanks, MD;  Location: [facility] C[facility] LAB;  Service: Cardiovascular;;  . PERCUTANEOUS CORONARY STENT INTERVENTION (PCI-S) N/A 01/03/2012   Procedure: PERCUTANEOUS CORONARY STENT INTERVENTION (PCI-S);  Surgeon: Peter M Martinique, MD;  Location: [facility] CATH LAB;  Service: Cardiovascular;  Laterality: N/A;  . SHOULDER ARTHROSCOPY WITH OPEN ROTATOR CUFF REPAIR AND  DISTAL CLAVICLE ACROMINECTOMY Left 02/27/2013   Procedure: LEFT SHOULDER ARTHROSCOPY WITH MINI OPEN ROTATOR CUFF REPAIR AND SUBACROMIAL DECOMPRESSION AND DISTAL CLAVICLE RESECTION;  Surgeon: [name], MD;  Location: [facility];  Service: Orthopedics;  Laterality: Left;  .  TOTAL KNEE ARTHROPLASTY Right 03/2010   [name]  . TRIGGER FINGER RELEASE Left 02/27/2013   Procedure: RELEASE TRIGGER FINGER/A-1 PULLEY;  Surgeon: [name], MD;  Location: [facility];  Service: Orthopedics;  Laterality: Left;    Family History  Problem Relation Age of Onset  . COPD Mother   . Heart disease Father   . Heart attack Father   . Stomach cancer Brother   . Stroke Sister   . Alcohol abuse Sister   . Colon cancer Brother 25  . Diabetes Brother   . Rectal cancer Neg Hx     Social History   Socioeconomic History  . Marital status: Married    Spouse name: Not on file  . Number of children: 3  . Years of education: Not on file  . Highest education level: Not on file  Occupational History  . Occupation: Designer, jewellery: RETIRED    Comment: retired  Tobacco Use  . Smoking status: Former Smoker    Packs/day: 1.00    Years: 20.00    Pack years: 20.00    Types: Cigarettes    Quit date: 04/06/1963    Years since quitting: 56.7  . Smokeless tobacco: Never Used  Vaping Use  . Vaping Use: Never used  Substance and Sexual Activity  . Alcohol use: No    Alcohol/week: 0.0 standard drinks    Comment: 01/01/2012 "last alcohol ~ 50 yr ago"  . Drug use: No  . Sexual activity: Not Currently  Other Topics Concern  . Not on file  Social History Narrative   No living will   Requests wife as health care POA-- alternate is daughter Hassan Rowan   Discussed DNR --he requests this (done 08/29/12)   Not sure about feeding tube---but might accept for some time   Patient lives with wife and daughter in a one story home.  Has 3 children.  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   Social  Determinants of Health   Financial Resource Strain: Low Risk   . Difficulty of Paying Living Expenses: Not very hard  Food Insecurity:   . Worried About Charity fundraiser in the Last Year: Not on file  . Ran Out of Food in the Last Year: Not on file  Transportation Needs:   . Lack of Transportation (Medical): Not on file  . Lack of Transportation (Non-Medical): Not on file  Physical Activity:   . Days of Exercise per Week: Not on file  . Minutes of Exercise per Session: Not on file  Stress:   . Feeling of Stress : Not on file  Social Connections:   . Frequency of Communication with Friends and Family: Not on file  . Frequency of Social Gatherings with Friends and Family: Not on file  . Attends Religious Services: Not on file  . Active Member of Clubs or Organizations: Not on file  . Attends Archivist Meetings: Not on file  . Marital Status: Not on file  Intimate Partner Violence:   . Fear of Current or Ex-Partner: Not on file  . Emotionally Abused: Not on file  . Physically Abused: Not on file  . Sexually Abused: Not on file   Review of Systems  Appetite is good Weight now stabilized about 20# less than his usual Sleeps okay Tries to eat healthy---limits bread, etc     Objective:   Physical Exam Constitutional:      Appearance: Normal appearance.  Cardiovascular:     Rate and Rhythm: Normal rate and regular rhythm.     Heart sounds: No gallop.      Comments: Soft aortic systolic murmur Pulmonary:     Effort: Pulmonary effort is normal.     Breath sounds: Normal breath sounds. No wheezing.  Musculoskeletal:     Cervical back: Neck supple.     Right lower leg: No edema.     Left lower leg: No edema.  Lymphadenopathy:     Cervical: No cervical adenopathy.  Skin:    Comments: No foot lesions  Neurological:     Mental Status: He is alert.            Assessment & Plan:

## 2019-12-14 NOTE — Assessment & Plan Note (Signed)
Lab Results  Component Value Date   HGBA1C 8.1 (A) 12/14/2019   Control stable----but fasting sugars often over 200 Discussed the pros/cons of Rx---mostly want better control to prevent worsening of neuropathy Will avoid SGLT2 inhibitors due to CKD Try low dose semaglutide--stop if GI intolerance

## 2019-12-17 DIAGNOSIS — N184 Chronic kidney disease, stage 4 (severe): Secondary | ICD-10-CM | POA: Diagnosis not present

## 2019-12-17 DIAGNOSIS — H353221 Exudative age-related macular degeneration, left eye, with active choroidal neovascularization: Secondary | ICD-10-CM | POA: Diagnosis not present

## 2019-12-17 DIAGNOSIS — H353213 Exudative age-related macular degeneration, right eye, with inactive scar: Secondary | ICD-10-CM | POA: Diagnosis not present

## 2019-12-17 DIAGNOSIS — D3131 Benign neoplasm of right choroid: Secondary | ICD-10-CM | POA: Diagnosis not present

## 2019-12-17 DIAGNOSIS — H35372 Puckering of macula, left eye: Secondary | ICD-10-CM | POA: Diagnosis not present

## 2019-12-19 ENCOUNTER — Other Ambulatory Visit: Payer: Self-pay | Admitting: Student in an Organized Health Care Education/Training Program

## 2019-12-19 DIAGNOSIS — G894 Chronic pain syndrome: Secondary | ICD-10-CM

## 2019-12-19 DIAGNOSIS — M792 Neuralgia and neuritis, unspecified: Secondary | ICD-10-CM

## 2019-12-19 DIAGNOSIS — E1142 Type 2 diabetes mellitus with diabetic polyneuropathy: Secondary | ICD-10-CM

## 2019-12-20 ENCOUNTER — Telehealth: Payer: Self-pay

## 2019-12-20 DIAGNOSIS — E1142 Type 2 diabetes mellitus with diabetic polyneuropathy: Secondary | ICD-10-CM

## 2019-12-20 DIAGNOSIS — M792 Neuralgia and neuritis, unspecified: Secondary | ICD-10-CM

## 2019-12-20 DIAGNOSIS — G894 Chronic pain syndrome: Secondary | ICD-10-CM

## 2019-12-20 NOTE — Telephone Encounter (Signed)
Looked in patients chart and it states that they did received medications. He needs to call pharm and inquire

## 2019-12-20 NOTE — Telephone Encounter (Signed)
The pharmacy doesn't have the 3 scripts for hydrocodone. He was supposed to send 3 refills. Please call

## 2019-12-21 NOTE — Telephone Encounter (Signed)
Spoke with [name] at pharmacy.  States they did not receive the 9-11 or 10-11 prescriptions for Hydrocodone.  He double checked and states they did not receive them.  They did however receive the September and October scripts for [facility].  Patient notified that Dr [name] was out of the office until Tuesday but that I would send him this message.

## 2019-12-21 NOTE — Telephone Encounter (Signed)
Pharmacy is telling patient they have no scripts please call pharmacy and check what the problem is  Hydrocodone and oxycodone Thank you

## 2019-12-21 NOTE — Telephone Encounter (Signed)
Pharmacy called stating they do not have script for Sept or Oct showing in there system.

## 2019-12-22 ENCOUNTER — Encounter (HOSPITAL_COMMUNITY): Payer: Self-pay | Admitting: Pharmacist

## 2019-12-25 MED ORDER — HYDROCODONE-ACETAMINOPHEN 10-325 MG PO TABS: 1.0000 | ORAL_TABLET | ORAL | 0 refills | Status: DC | PRN

## 2019-12-25 NOTE — Telephone Encounter (Signed)
Hydrocodone prescription sent 10, fill dates 12/25/2019; 01/24/2020  Requested Prescriptions   Signed Prescriptions Disp Refills  . HYDROcodone-acetaminophen (NORCO) 10-325 MG tablet 150 tablet 0    Sig: Take 1 tablet by mouth every 4 (four) hours as needed for severe pain. Must last 30 days.    Authorizing Provider: Gillis Santa  . HYDROcodone-acetaminophen (NORCO) 10-325 MG tablet 150 tablet 0    Sig: Take 1 tablet by mouth every 4 (four) hours as needed for severe pain. Must last 30 days.    Authorizing Provider: Gillis Santa

## 2019-12-25 NOTE — Telephone Encounter (Signed)
Patient notified that Dr [name] had sent in scripts.

## 2019-12-28 ENCOUNTER — Other Ambulatory Visit: Payer: Self-pay | Admitting: Cardiovascular Disease

## 2020-01-01 ENCOUNTER — Telehealth: Payer: Self-pay

## 2020-01-01 ENCOUNTER — Ambulatory Visit
Admission: EM | Admit: 2020-01-01 | Discharge: 2020-01-01 | Disposition: A | Discharge status: Home / Self Care | Payer: Medicare Other

## 2020-01-01 DIAGNOSIS — R110 Nausea: Secondary | ICD-10-CM | POA: Diagnosis not present

## 2020-01-01 DIAGNOSIS — K2100 Gastro-esophageal reflux disease with esophagitis, without bleeding: Secondary | ICD-10-CM | POA: Diagnosis not present

## 2020-01-01 NOTE — Telephone Encounter (Signed)
[facility] - Client TELEPHONE ADVICE RECORD [facility] Patient Name: [name] Gender: Male DOB: 1931-11-28 Age: 86 Y 11 M 14 D Return Phone Number: [phone] (Primary), [phone] (Secondary) Address: City/State/Zip: [place]  [address] Client [facility] - Client Client Site [place] - Day Physician Viviana Simpler- MD Contact Type Call Who Is Calling Patient / Member / Family / Caregiver Call Type Triage / Clinical Relationship To Patient Self Return Phone Number [phone] (Primary) Chief Complaint Nausea Reason for Call Symptomatic / Request for [place] states he just wakes up every morning severely nauseous. He has been feeling weak. He does have medication for nausea. Translation No Nurse Assessment Nurse: Durene Cal, RN, Brandi Date/Time (Eastern Time): 01/01/2020 11:49:46 AM Confirm and document reason for call. If symptomatic, describe symptoms. ---Caller states he just wakes up every morning severely nauseous. He has been feeling generally weak with runny nose. He does have medication for nausea. No fever. Caller has urinated at least 2x in the last 24 hours with BURNING with urination. Does the patient have any new or worsening symptoms? ---Yes Will a triage be completed? ---Yes Related visit to physician within the last 2 weeks? ---Yes Does the PT have any chronic conditions? (i.e. diabetes, asthma, this includes High risk factors for pregnancy, etc.) ---Yes List chronic conditions. ---Diabetic HTN Is this a behavioral health or substance abuse call? ---No Guidelines Guideline Title Affirmed Question Affirmed Notes Nurse Date/Time Eilene Ghazi Time) Urination Pain - Male Diabetes mellitus or weak immune system (e.g., HIV positive, cancer chemo, splenectomy, organ transplant, chronic steroids) [place], RN, Brandi 01/01/2020 11:52:19  AM Weakness (Generalized) and Fatigue [1] MODERATE weakness (i.e., interferes with work, school, normal activities) [place], RN, Velna Hatchet 01/01/2020 11:58:41 AM PLEASE NOTE: All timestamps contained within this report are represented as [place] Standard Time. CONFIDENTIALTY NOTICE: This fax transmission is intended only for the addressee. It contains information that is legally privileged, confidential or otherwise protected from use or disclosure. If you are not the intended recipient, you are strictly prohibited from reviewing, disclosing, copying using or disseminating any of this information or taking any action in reliance on or regarding this information. If you have received this fax in error, please notify us immediately by telephone so that we can arrange for its return to [place]. Phone: [phone], Toll-Free: [phone], Fax: [phone] Page: 2 of 3 Call Id: [id number] Guidelines Guideline Title Affirmed Question Affirmed Notes Nurse Date/Time Eilene Ghazi Time) [2] cause unknown (Exceptions: weakness with acute minor illness, or weakness from poor fluid intake) Disp. Time Eilene Ghazi Time) Disposition Final User 01/01/2020 11:54:00 AM See HCP within 4 Hours (or PCP triage) Durene Cal, RN, [place] 01/01/2020 12:11:37 PM See HCP within 4 Hours (or PCP triage) Yes Durene Cal, RN, Berdie Ogren Disagree/Comply Comply Caller Understands Yes PreDisposition Call Doctor Care Advice Given Per Guideline SEE HCP (OR PCP TRIAGE) WITHIN 4 HOURS: CALL BACK IF: * You become worse SEE HCP (OR PCP TRIAGE) WITHIN 4 HOURS: * IF OFFICE WILL BE OPEN: You need to be seen within the next 3 or 4 hours. Call your doctor (or NP/PA) now or as soon as the office opens. CALL BACK IF: * You become worse Comments User: Ermalene Postin, RN Date/Time Eilene Ghazi Time): 01/01/2020 11:53:00 AM HX: Hernia No vomit as of yet. User: Ermalene Postin, RN Date/Time Eilene Ghazi Time): 01/01/2020 11:57:55 AM CORRECTION:  CALLER DOES NOT HAVE BURNING WITH URINATION> he has BURNING IN FEET. Reports he  does see his urologist this coming Monday. User: Ermalene Postin, RN Date/Time Eilene Ghazi Time): 01/01/2020 12:01:16 PM caller did not check his blood sugar this am. RN encouraged caller to check blood glucose. Reports he no longer takes insulin or rx for diabetes User: Ermalene Postin, RN Date/Time (Eastern Time): 01/01/2020 12:10:15 PM Caller unable to obtain blood glucose. (difficulty getting blood from finger). General weakness interfering with ADLs User: Ermalene Postin, RN Date/Time (Eastern Time): 01/01/2020 12:11:10 PM Caller agrees to go to UC. User: Ermalene Postin, RN Date/Time Eilene Ghazi Time): 01/01/2020 12:12:09 PM RN attempted back line x2 attempts; no answer. Referrals Warm transfer to backline PLEASE NOTE: All timestamps contained within this report are represented as [place] Standard Time. CONFIDENTIALTY NOTICE: This fax transmission is intended only for the addressee. It contains information that is legally privileged, confidential or otherwise protected from use or disclosure. If you are not the intended recipient, you are strictly prohibited from reviewing, disclosing, copying using or disseminating any of this information or taking any action in reliance on or regarding this information. If you have received this fax in error, please notify us immediately by telephone so that we can arrange for its return to [place]. Phone: [phone], Toll-Free: [phone], Fax: [phone] Page: 3 of 3 Call Id: [id number] Referrals REFERRED TO PCP OFFICE Warm transfer to backline

## 2020-01-01 NOTE — ED Provider Notes (Signed)
[name]    CSN: [id number] Arrival date & time: 01/01/20  1239      History   Chief Complaint Chief Complaint  Patient presents with   Nausea    HPI [name] is a 86 y.o. male.   Reports that he has been experiencing nausea on and off for the last 2 months.  Reports that he has Zofran, and that when he takes his medication it takes about 2 to 3 hours to work.  Reports that he takes all of his medications at 1 time after he eats breakfast including his medication for reflux.  Reports that the nausea begins after he eats in the mornings before he takes his medications.  Denies vomiting, but consistent nausea.  Denies abdominal pain.  Reports that he does use MiraLAX to keep his bowels moving regularly.  Denies headache, cough, sore throat, vomiting, diarrhea, rash, fever, other symptoms.  ROS per HPI  The history is provided by the patient.    Past Medical History:  Diagnosis Date   Arthritis    osteoarthritis, s/p R TKR, and digits   CAD (coronary artery disease)    a. s/p CABG (2001)  b. s/p DES to RCA and cutting POBA to ostial PDA (2013)   c. s/p DES to SVG to OM2 (01/14/14) d. cath: 08/2015 NSTEMI w/ patent LIMA-LAD and 99% stenosis of SVG-OM w/ DES placed. CTO of SVG-RCA and SVG-D1.    Cataract    Chronic diastolic CHF (congestive heart failure) ([place])    a) 09/13 ECHO- LVEF 97-67%, grade 1 diastolic dysfunction, mild LA dilatation, atrial septal aneurysm, AV mobility restricted, but no sig AS by doppler; b) 09/04/08 ECHO- LVH, ef 60%, mild AS, c. echo 08/2015: EF perserved of 55-60% with inferolateral HK. Mild AS noted.   Chronic kidney disease, stage III (moderate)    Chronic lower back pain    Colon polyps    COVID-19    Diverticulosis    Dyspnea 2009 since July -Sept   05/06/08-CPST-  normal effort, reduced VO2 max 20.5 /65%, reduced at 8.2/ 40%, normal breathing resetvca of 55%, submaximal heart rate response 112/77%, flattened o2 pluse  response at peak exercise-12 ml/beat @ 85%, No VQ mismatch abnormalities, All c/w CIRC Limitation   Enlarged prostate    Esophageal stricture    a. s/p dilation spring 2010   GERD (gastroesophageal reflux disease)    Heart murmur    Hiatal hernia    History of carpal tunnel syndrome    Bilateral   History of kidney stones    History of PFTs    mixed pattern on spiro. mild restn on lung volumes with near normal DLCO. Pattern can be explained by CABG scar. Fev1 2.2L/73%, ratio 68 (67), TLC 4.7/68%,RV 1.5L/55%,DLCO 79%   Hyperlipidemia    Hypertension    Interstitial lung disease ([name])    NOS   Iron deficiency anemia    Nausea & vomiting    2018/2019   On home oxygen therapy    2 L [place] at bedtime   Osteoporosis    Overweight (BMI 25.0-29.9)    BMI 29   Peripheral neuropathy    RA (rheumatoid arthritis) ([place])    [name]   Seropositive rheumatoid arthritis ([place])    Type II diabetes mellitus ([place])    diet controlled   Wears glasses    Wears partial dentures    upper    Patient Active Problem List   Diagnosis Date Noted  Chronic narcotic dependence ([place]) 12/14/2019   Callus of foot 11/08/2019   Plantar flexed metatarsal bone of left foot 11/08/2019   COVID-19 virus infection 04/12/2019   Long term prescription opiate use 12/14/2018   Neuropathy 12/14/2018   CKD stage 3 due to type 2 diabetes mellitus ([place]) 12/14/2018   Problems with swallowing and mastication    Achalasia    Right inguinal hernia 07/04/2018   Advance directive discussed with patient 04/28/2018   Chronic pain of both lower extremities 10/26/2017   Neuropathic pain 10/26/2017   Chronic pain syndrome 10/26/2017   Iron deficiency anemia 07/05/2017   Constipation 07/05/2017   CKD (chronic kidney disease) stage 4, GFR 15-29 ml/min ([name]) 05/17/2017   Seropositive rheumatoid arthritis ([place]) 05/17/2017   DM (diabetes mellitus) type II controlled, neurological  manifestation ([place]) 05/17/2017   Atherosclerotic heart disease of native coronary artery with angina pectoris ([place]) 05/17/2017   Dysphagia 05/17/2017   High risk medication use 10/21/2016   Primary osteoarthritis of both knees 10/21/2016   History of right knee joint replacement 10/21/2016   Orthostatic hypotension 10/01/2016   Tegretol-induced dizziness 05/14/2016   Spinal stenosis of lumbar region without neurogenic claudication 03/23/2016   Lumbar spondylosis 03/23/2016   Respiratory failure, chronic ([place]) 11/21/2014   Rheumatoid arthritis ([place]) 11/05/2014   Rheumatic fever without heart involvement 03/04/2014   Ventral hernia 12/17/2013   Routine general medical examination at a health care facility 08/29/2012   Routine history and physical examination of adult 08/29/2012   ILD (interstitial lung disease) ([place]) 11/28/2011   Chronic diastolic heart failure ([place]) 09/14/2011   Diabetic polyneuropathy associated with type 2 diabetes mellitus ([place]) 08/10/2010   Obstructive sleep apnea 12/17/2008   ESOPHAGEAL STRICTURE 10/09/2008   Esophageal stricture 10/09/2008   Reflux esophagitis 09/10/2008   Diverticulosis of colon 09/10/2008   Gastro-esophageal reflux disease with esophagitis 09/10/2008   Coronary atherosclerosis 03/19/2008   Aortic valve disorder 03/19/2008   Thoracic aorta atherosclerosis ([place]) 03/19/2008   Actinic keratosis 10/23/2007   SLEEP DISORDER, CHRONIC 10/17/2006   Disturbance in sleep behavior 10/17/2006   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   Essential hypertension 09/23/2006   GERD 09/23/2006   BENIGN PROSTATIC HYPERTROPHY 09/23/2006   Enlarged prostate without lower urinary tract symptoms (luts) 09/23/2006   HLD (hyperlipidemia) 09/23/2006    Past Surgical History:  Procedure Laterality Date   BALLOON DILATION N/A 09/12/2018   Procedure: BALLOON DILATION;  Surgeon: Irene Shipper, MD;  Location: [facility]  ENDOSCOPY;  Service: Endoscopy;  Laterality: N/A;   BOTOX INJECTION N/A 09/12/2018   Procedure: BOTOX INJECTION;  Surgeon: Irene Shipper, MD;  Location: [facility];  Service: Endoscopy;  Laterality: N/A;   BOTOX INJECTION N/A 11/27/2018   Procedure: BOTOX INJECTION;  Surgeon: Irene Shipper, MD;  Location: [facility];  Service: Endoscopy;  Laterality: N/A;   CARDIAC CATHETERIZATION  08/2004   CP- no MI, Cath- small vessell disease    CARDIAC CATHETERIZATION  12/31/2011   80% distal LM, 100% native LAD, LCx and RCA, 30% prox SVG-OM, SVG-D1 normal, 99% distal, 80% ostial SVG-RCA distal to graft, LIMA-LAD normal; LVEF mildly decreased with posterior basal AK    CARDIAC CATHETERIZATION  2009   with patent grafts/notes 12/31/2011   CARDIAC CATHETERIZATION N/A 08/13/2015   Procedure: Left Heart Cath and Cors/Grafts Angiography;  Surgeon: Sherren Mocha, MD;  Location: [facility];  Service: Cardiovascular;  Laterality: N/A;   CATARACT EXTRACTION W/ INTRAOCULAR LENS  IMPLANT, BILATERAL Bilateral  CHOLECYSTECTOMY OPEN  11/2003   Christella Hartigan   CORONARY ANGIOPLASTY WITH STENT PLACEMENT  01/03/2012   Successful DES to SVG-RCA and cutting balloon angioplasty ostial  PDA    CORONARY ANGIOPLASTY WITH STENT PLACEMENT  01/14/2014   "1"   CORONARY ARTERY BYPASS GRAFT  11/1999   CABG X5   CORONARY STENT PLACEMENT  02/2012   1 stent and balloon   ESOPHAGEAL DILATION  11/27/2018   Procedure: ESOPHAGEAL DILATION;  Surgeon: Hilarie Fredrickson, MD;  Location: [facility];  Service: Endoscopy;;   ESOPHAGOGASTRODUODENOSCOPY N/A 03/01/2017   Procedure: ESOPHAGOGASTRODUODENOSCOPY (EGD);  Surgeon: Hilarie Fredrickson, MD;  Location: [facility];  Service: Endoscopy;  Laterality: N/A;   ESOPHAGOGASTRODUODENOSCOPY (EGD) WITH ESOPHAGEAL DILATION  2010   ESOPHAGOGASTRODUODENOSCOPY (EGD) WITH PROPOFOL N/A 09/12/2018   Procedure: ESOPHAGOGASTRODUODENOSCOPY (EGD) WITH PROPOFOL;  Surgeon: Hilarie Fredrickson, MD;  Location:  [facility];  Service: Endoscopy;  Laterality: N/A;   ESOPHAGOGASTRODUODENOSCOPY (EGD) WITH PROPOFOL N/A 11/27/2018   Procedure: ESOPHAGOGASTRODUODENOSCOPY (EGD) WITH PROPOFOL, WITH BALLOON DILATION;  Surgeon: Hilarie Fredrickson, MD;  [facility];  Service: Endoscopy;  Laterality: N/A;   HAND SURGERY     bilateral carpal tunnel releases   JOINT REPLACEMENT     KNEE ARTHROSCOPY Right 2008   LEFT AND RIGHT HEART CATHETERIZATION WITH CORONARY ANGIOGRAM  12/31/2011   Procedure: LEFT AND RIGHT HEART CATHETERIZATION WITH CORONARY ANGIOGRAM;  Surgeon: Kathleene Hazel, MD;  Location: [facility] CATH LAB;  Service: Cardiovascular;;   LEFT AND RIGHT HEART CATHETERIZATION WITH CORONARY ANGIOGRAM N/A 01/14/2014   Procedure: LEFT AND RIGHT HEART CATHETERIZATION WITH CORONARY ANGIOGRAM;  Surgeon: Peter M Swaziland, MD;  Location: [facility] CATH LAB;  Service: Cardiovascular;  Laterality: N/A;   MALONEY DILATION  03/01/2017   Procedure: Elease Hashimoto DILATION;  Surgeon: Hilarie Fredrickson, MD;  Location: [facility];  Service: Endoscopy;;   PERCUTANEOUS CORONARY INTERVENTION-BALLOON ONLY  01/03/2012   Procedure: PERCUTANEOUS CORONARY INTERVENTION-BALLOON ONLY;  Surgeon: Peter M Swaziland, MD;  Location: [facility] CATH LAB;  Service: Cardiovascular;;   PERCUTANEOUS CORONARY STENT INTERVENTION (PCI-S)  12/31/2011   Procedure: PERCUTANEOUS CORONARY STENT INTERVENTION (PCI-S);  Surgeon: Kathleene Hazel, MD;  Location: [facility] CATH LAB;  Service: Cardiovascular;;   PERCUTANEOUS CORONARY STENT INTERVENTION (PCI-S) N/A 01/03/2012   Procedure: PERCUTANEOUS CORONARY STENT INTERVENTION (PCI-S);  Surgeon: Peter M Swaziland, MD;  Location: [facility] CATH LAB;  Service: Cardiovascular;  Laterality: N/A;   SHOULDER ARTHROSCOPY WITH OPEN ROTATOR CUFF REPAIR AND DISTAL CLAVICLE ACROMINECTOMY Left 02/27/2013   Procedure: LEFT SHOULDER ARTHROSCOPY WITH MINI OPEN ROTATOR CUFF REPAIR AND SUBACROMIAL DECOMPRESSION AND DISTAL CLAVICLE RESECTION;  Surgeon:  Valeria Batman, MD;  Location: [facility] OR;  Service: Orthopedics;  Laterality: Left;   TOTAL KNEE ARTHROPLASTY Right 03/2010   Dr Brynda Greathouse   TRIGGER FINGER RELEASE Left 02/27/2013   Procedure: RELEASE TRIGGER FINGER/A-1 PULLEY;  Surgeon: Valeria Batman, MD;  Location[facility] OR;  Service: Orthopedics;  Laterality: Left;       Home Medications    Prior to Admission medications   Medication Sig Start Date End Date Taking? Authorizing Provider  Alpha-Lipoic Acid 600 MG CAPS Take 1 capsule (600 mg total) by mouth daily. 11/13/19 02/11/20 Yes Edward Jolly, MD  amLODipine (NORVASC) 5 MG tablet Take 5 mg by mouth daily.  08/06/19  Yes [provider]  aspirin EC 81 MG tablet Take 81 mg by mouth at bedtime.   Yes [provider]  Blood Glucose Monitoring Suppl (ONE TOUCH ULTRA 2) w/Device KIT Use  to obtain blood sugar daily. Dx Code E11.40 04/12/19  Yes [name], MD  carvedilol (COREG) 6.25 MG tablet Take 6.25 mg by mouth 2 (two) times daily. 11/15/19  Yes [provider]  clopidogrel (PLAVIX) 75 MG tablet Take 1 tablet (75 mg total) by mouth daily. Please make overdue appt with Dr. Johnsie Cancel before anymore refills. 2nd attempt 12/28/19  Yes [name], MD  DHA-Vitamin C-Lutein (EYE HEALTH FORMULA PO) Take 1 tablet by mouth daily.    Yes [provider]  furosemide (LASIX) 40 MG tablet Take 40 mg by mouth at bedtime. Monday, Wednesday, Friday.   Yes [provider]  glucose blood (ONE TOUCH ULTRA TEST) test strip USE TO CHECK BLOOD SUGAR ONCE A DAY Dx Code E11.40 07/18/17  Yes [name], MD  HYDROcodone-acetaminophen (NORCO) 10-325 MG tablet Take 1 tablet by mouth every 4 (four) hours as needed for severe pain. Must last 30 days. 12/25/19 01/24/20 Yes Gillis Santa, MD  HYDROcodone-acetaminophen (NORCO) 10-325 MG tablet Take 1 tablet by mouth every 4 (four) hours as needed for severe pain. Must last 30 days. 01/24/20 02/23/20 Yes Gillis Santa, MD    losartan (COZAAR) 25 MG tablet Take 25 mg by mouth daily. 08/06/19  Yes [provider]  nitroGLYCERIN (NITROSTAT) 0.4 MG SL tablet DISSOLVE 1 TABLET UNDER TONGUE AS NEEDEDFOR CHEST PAIN. MAY REPEAT 5 MINUTES APART 3 TIMES IF NEEDED. IF NO RELIEF CALL 911 03/23/19  Yes Viviana Simpler I, MD  ondansetron (ZOFRAN) 4 MG tablet TAKE 1 TABLET BY MOUTH EVERY 8 HOURS AS NEEDED FOR NAUSEA OR VOMITING 08/21/19  Yes [name], MD  [facility] DELICA LANCETS 82N MISC 1 each by In Vitro route daily. Dx Code E11.49 07/18/17  Yes [name], MD  oxyCODONE ER [facility] ER) 9 MG C12A Take 1 capsule by mouth every 12 (twelve) hours. For chronic pain syndrome. Long acting opioid analgesic. 12/20/19 01/19/20 Yes Gillis Santa, MD  oxyCODONE ER (XTAMPZA ER) 9 MG C12A Take 1 capsule by mouth every 12 (twelve) hours. For chronic pain syndrome. Long acting opioid analgesic. 01/19/20 02/18/20 Yes Gillis Santa, MD  OXYGEN Inhale into the lungs. Per pt- Uses Oxygen 2 liters at night.   Yes [provider]  pantoprazole (PROTONIX) 40 MG tablet Take 1 tablet (40 mg total) by mouth daily. 08/06/19  Yes [name], MD  polyethylene glycol (MIRALAX / GLYCOLAX) 17 g packet Take 51 g by mouth daily.    Yes [provider]  Semaglutide 3 MG TABS Take 3 mg by mouth daily before breakfast. 12/14/19  Yes [name], MD  tamsulosin (FLOMAX) 0.4 MG CAPS capsule Take 0.4 mg by mouth daily. 01/31/19  Yes [provider]    Family History Family History  Problem Relation Age of Onset   COPD Mother    Heart disease Father    Heart attack Father    Stomach cancer Brother    Stroke Sister    Alcohol abuse Sister    Colon cancer Brother 28   Diabetes Brother    Rectal cancer Neg Hx     Social History Social History   Tobacco Use   Smoking status: Former Smoker    Packs/day: 1.00    Years: 20.00    Pack years: 20.00    Types: Cigarettes    Quit date: 04/06/1963     Years since quitting: 56.7   Smokeless tobacco: Never Used  Scientific laboratory technician  Use: Never used  Substance Use Topics   Alcohol use: No    Alcohol/week: 0.0 standard drinks    Comment: 01/01/2012 "last alcohol ~ 50 yr ago"   Drug use: No     Allergies   Doxazosin mesylate and Methocarbamol   Review of Systems Review of Systems   Physical Exam Triage Vital Signs ED Triage Vitals  Enc Vitals Group     BP 01/01/20 1251 (!) 150/75     Pulse Rate 01/01/20 1251 (!) 58     Resp 01/01/20 1251 18     Temp 01/01/20 1251 98.2 F (36.8 C)     Temp Source 01/01/20 1251 Oral     SpO2 01/01/20 1251 94 %     Weight --      Height --      Head Circumference --      Peak Flow --      Pain Score 01/01/20 1255 0     Pain Loc --      Pain Edu? --      Excl. in [place]? --    No data found.  Updated Vital Signs BP (!) 150/75 (BP Location: Left Arm)    Pulse (!) 58    Temp 98.2 F (36.8 C) (Oral)    Resp 18    SpO2 94% Comment: pt states he runs 93 at home and is on O2 at night  Visual Acuity Right Eye Distance:   Left Eye Distance:   Bilateral Distance:    Right Eye Near:   Left Eye Near:    Bilateral Near:     Physical Exam Vitals and nursing note reviewed.  Constitutional:      General: He is not in acute distress.    Appearance: Normal appearance. He is well-developed and normal weight. He is not ill-appearing.  HENT:     Head: Normocephalic and atraumatic.  Eyes:     Conjunctiva/sclera: Conjunctivae normal.  Cardiovascular:     Rate and Rhythm: Normal rate and regular rhythm.     Heart sounds: Normal heart sounds. No murmur heard.   Pulmonary:     Effort: Pulmonary effort is normal. No respiratory distress.     Breath sounds: Normal breath sounds. No stridor. No wheezing, rhonchi or rales.  Chest:     Chest wall: No tenderness.  Abdominal:     General: Bowel sounds are normal. There is no distension.     Palpations: Abdomen is soft. There is no mass.      Tenderness: There is no abdominal tenderness. There is no right CVA tenderness, left CVA tenderness, guarding or rebound.     Hernia: No hernia is present.  Musculoskeletal:        General: Normal range of motion.     Cervical back: Normal range of motion and neck supple.  Skin:    General: Skin is warm and dry.     Capillary Refill: Capillary refill takes less than 2 seconds.  Neurological:     General: No focal deficit present.     Mental Status: He is alert and oriented to person, place, and time.  Psychiatric:        Mood and Affect: Mood normal.        Behavior: Behavior normal.        Thought Content: Thought content normal.      UC Treatments / Results  Labs (all labs ordered are listed, but only abnormal results are displayed) Labs Reviewed - No  data to display  EKG   Radiology No results found.  Procedures Procedures (including critical care time)  Medications Ordered in UC Medications - No data to display  Initial Impression / Assessment and Plan / UC Course  I have reviewed the triage vital signs and the nursing notes.  Pertinent labs & imaging results that were available during my care of the patient were reviewed by me and considered in my medical decision making (see chart for details).     Nausea GERD  Presents today with ongoing nausea for the last 2 months Has been taking Zofran with little relief Discussed how he is taking his medications Discussed that he should take his reflux medications on an empty stomach before he eats breakfast, and that if it is taking 2 to 3 hours for his Zofran to work then it is likely not working at all Instructed to follow-up with this office or with primary care if this is not working out well for him Follow-up with the ER for trouble swallowing, trouble breathing, other concerning symptoms Patient verbalized understanding is in agreement treatment plan  Final Clinical Impressions(s) / UC Diagnoses   Final  diagnoses:  Nausea without vomiting  Gastroesophageal reflux disease with esophagitis without hemorrhage     Discharge Instructions     Take your reflux medication in the morning on an empty stomach  Continue with home medication regimen as prescribed.  Follow up as needed    ED Prescriptions    None     PDMP not reviewed this encounter.   Faustino Congress, NP 01/01/20 1330

## 2020-01-01 NOTE — Telephone Encounter (Signed)
Per chart review tab pt went to [facility].

## 2020-01-01 NOTE — ED Triage Notes (Signed)
Pt has had ongoing nausea without vomiting for approx 2 mos.  Has Zofran at home but states it takes 3-4 hours to work. Reports rare episodes of dizziness and feeling lightheaded.  Last episode this morning.  Denies CP.  PCP referred him to UC.

## 2020-01-01 NOTE — Discharge Instructions (Signed)
Take your reflux medication in the morning on an empty stomach  Continue with home medication regimen as prescribed.  Follow up as needed

## 2020-01-02 NOTE — Telephone Encounter (Signed)
Left message on VM per DPR. 

## 2020-01-02 NOTE — Telephone Encounter (Signed)
He was seen and diagnosed with GERD only (presumably). I guess he was not taking his protonix on an empty stomach-----please have him increase protonix to bid for now----30 minutes before breakfast if possible and then at bedtime. If he has ongoing symptoms, we will set him back up with Dr Henrene Pastor

## 2020-01-05 ENCOUNTER — Ambulatory Visit (INDEPENDENT_AMBULATORY_CARE_PROVIDER_SITE_OTHER): Payer: Medicare Other

## 2020-01-05 ENCOUNTER — Other Ambulatory Visit: Payer: Self-pay

## 2020-01-05 DIAGNOSIS — Z23 Encounter for immunization: Secondary | ICD-10-CM

## 2020-01-07 DIAGNOSIS — L821 Other seborrheic keratosis: Secondary | ICD-10-CM | POA: Diagnosis not present

## 2020-01-07 DIAGNOSIS — N184 Chronic kidney disease, stage 4 (severe): Secondary | ICD-10-CM | POA: Diagnosis not present

## 2020-01-07 DIAGNOSIS — E1122 Type 2 diabetes mellitus with diabetic chronic kidney disease: Secondary | ICD-10-CM | POA: Diagnosis not present

## 2020-01-07 DIAGNOSIS — L905 Scar conditions and fibrosis of skin: Secondary | ICD-10-CM | POA: Diagnosis not present

## 2020-01-07 DIAGNOSIS — R809 Proteinuria, unspecified: Secondary | ICD-10-CM | POA: Diagnosis not present

## 2020-01-07 DIAGNOSIS — L820 Inflamed seborrheic keratosis: Secondary | ICD-10-CM | POA: Diagnosis not present

## 2020-01-07 DIAGNOSIS — L814 Other melanin hyperpigmentation: Secondary | ICD-10-CM | POA: Diagnosis not present

## 2020-01-07 DIAGNOSIS — I129 Hypertensive chronic kidney disease with stage 1 through stage 4 chronic kidney disease, or unspecified chronic kidney disease: Secondary | ICD-10-CM | POA: Diagnosis not present

## 2020-01-07 DIAGNOSIS — L570 Actinic keratosis: Secondary | ICD-10-CM | POA: Diagnosis not present

## 2020-01-08 ENCOUNTER — Telehealth: Payer: Self-pay | Admitting: Cardiovascular Disease

## 2020-01-08 ENCOUNTER — Other Ambulatory Visit: Payer: Self-pay | Admitting: Cardiovascular Disease

## 2020-01-08 MED ORDER — CLOPIDOGREL BISULFATE 75 MG PO TABS
75.0000 mg | ORAL_TABLET | Freq: Every day | ORAL | 0 refills | Status: DC
Start: 2020-01-08 — End: 2020-01-22

## 2020-01-08 NOTE — Telephone Encounter (Signed)
*  STAT* If patient is at the pharmacy, call can be transferred to refill team.   1. Which medications need to be refilled? (please list name of each medication and dose if known)  clopidogrel (PLAVIX) 75 MG tablet  2. Which pharmacy/location (including street and city if local pharmacy) is medication to be sent to? [place], [place]  3. Do they need a 30 day or 90 day supply? 90 day supply   Pt has an appt scheduled for 01/22/20.

## 2020-01-11 NOTE — Progress Notes (Deleted)
Office Visit Note  Patient: [name]             Date of Birth: Oct 21, 1931           MRN: [id number]             PCP: [name], MD Referring: [name], MD Visit Date: 01/25/2020 Occupation: @GUAROCC @  Subjective:  No chief complaint on file.   History of Present Illness: [name] is a 86 y.o. male ***   Activities of Daily Living:  Patient reports morning stiffness for *** {minute/hour:19697}.   Patient {ACTIONS;DENIES/REPORTS:21021675::"Denies"} nocturnal pain.  Difficulty dressing/grooming: {ACTIONS;DENIES/REPORTS:21021675::"Denies"} Difficulty climbing stairs: {ACTIONS;DENIES/REPORTS:21021675::"Denies"} Difficulty getting out of chair: {ACTIONS;DENIES/REPORTS:21021675::"Denies"} Difficulty using hands for taps, buttons, cutlery, and/or writing: {ACTIONS;DENIES/REPORTS:21021675::"Denies"}  No Rheumatology ROS completed.   PMFS History:  Patient Active Problem List   Diagnosis Date Noted  . Chronic narcotic dependence ([place]) 12/14/2019  . Callus of foot 11/08/2019  . Plantar flexed metatarsal bone of left foot 11/08/2019  . COVID-19 virus infection 04/12/2019  . Long term prescription opiate use 12/14/2018  . Neuropathy 12/14/2018  . CKD stage 3 due to type 2 diabetes mellitus ([place]) 12/14/2018  . Problems with swallowing and mastication   . Achalasia   . Right inguinal hernia 07/04/2018  . Advance directive discussed with patient 04/28/2018  . Chronic pain of both lower extremities 10/26/2017  . Neuropathic pain 10/26/2017  . Chronic pain syndrome 10/26/2017  . Iron deficiency anemia 07/05/2017  . Constipation 07/05/2017  . CKD (chronic kidney disease) stage 4, GFR 15-29 ml/min ([name]) 05/17/2017  . Seropositive rheumatoid arthritis ([place]) 05/17/2017  . DM (diabetes mellitus) type II controlled, neurological manifestation ([place]) 05/17/2017  . Atherosclerotic heart disease of native coronary artery with angina pectoris ([place]) 05/17/2017  .  Dysphagia 05/17/2017  . High risk medication use 10/21/2016  . Primary osteoarthritis of both knees 10/21/2016  . History of right knee joint replacement 10/21/2016  . Orthostatic hypotension 10/01/2016  . Tegretol-induced dizziness 05/14/2016  . Spinal stenosis of lumbar region without neurogenic claudication 03/23/2016  . Lumbar spondylosis 03/23/2016  . Respiratory failure, chronic ([place]) 11/21/2014  . Rheumatoid arthritis ([place]) 11/05/2014  . Rheumatic fever without heart involvement 03/04/2014  . Ventral hernia 12/17/2013  . Routine general medical examination at a health care facility 08/29/2012  . Routine history and physical examination of adult 08/29/2012  . ILD (interstitial lung disease) ([place]) 11/28/2011  . Chronic diastolic heart failure ([place]) 09/14/2011  . Diabetic polyneuropathy associated with type 2 diabetes mellitus ([place]) 08/10/2010  . Obstructive sleep apnea 12/17/2008  . ESOPHAGEAL STRICTURE 10/09/2008  . Esophageal stricture 10/09/2008  . Reflux esophagitis 09/10/2008  . Diverticulosis of colon 09/10/2008  . Gastro-esophageal reflux disease with esophagitis 09/10/2008  . Coronary atherosclerosis 03/19/2008  . Aortic valve disorder 03/19/2008  . Thoracic aorta atherosclerosis ([place]) 03/19/2008  . Actinic keratosis 10/23/2007  . SLEEP DISORDER, CHRONIC 10/17/2006  . Disturbance in sleep behavior 10/17/2006  . 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 Essential hypertension 09/23/2006  . GERD 09/23/2006  . BENIGN PROSTATIC HYPERTROPHY 09/23/2006  . Enlarged prostate without lower urinary tract symptoms (luts) 09/23/2006  . HLD (hyperlipidemia) 09/23/2006    Past Medical History:  Diagnosis Date  . Arthritis    osteoarthritis, s/p R TKR, and digits  . CAD (coronary artery disease)    a. s/p CABG (2001)  b. s/p DES to RCA and cutting POBA to ostial PDA (2013)   c. s/p DES to SVG  to OM2 (01/14/14) d. cath: 08/2015 NSTEMI w/ patent LIMA-LAD and  99% stenosis of SVG-OM w/ DES placed. CTO of SVG-RCA and SVG-D1.   . Cataract   . Chronic diastolic CHF (congestive heart failure) ([place])    a) 09/13 ECHO- LVEF 67-67%, grade 1 diastolic dysfunction, mild LA dilatation, atrial septal aneurysm, AV mobility restricted, but no sig AS by doppler; b) 09/04/08 ECHO- LVH, ef 60%, mild AS, c. echo 08/2015: EF perserved of 55-60% with inferolateral HK. Mild AS noted.  . Chronic kidney disease, stage III (moderate)   . Chronic lower back pain   . Colon polyps   . COVID-19   . Diverticulosis   . Dyspnea 2009 since July -Sept   05/06/08-CPST-  normal effort, reduced VO2 max 20.5 /65%, reduced at 8.2/ 40%, normal breathing resetvca of 55%, submaximal heart rate response 112/77%, flattened o2 pluse response at peak exercise-12 ml/beat @ 85%, No VQ mismatch abnormalities, All c/w CIRC Limitation  . Enlarged prostate   . Esophageal stricture    a. s/p dilation spring 2010  . GERD (gastroesophageal reflux disease)   . Heart murmur   . Hiatal hernia   . History of carpal tunnel syndrome    Bilateral  . History of kidney stones   . History of PFTs    mixed pattern on spiro. mild restn on lung volumes with near normal DLCO. Pattern can be explained by CABG scar. Fev1 2.2L/73%, ratio 68 (67), TLC 4.7/68%,RV 1.5L/55%,DLCO 79%  . Hyperlipidemia   . Hypertension   . Interstitial lung disease ([name])    NOS  . Iron deficiency anemia   . Nausea & vomiting    2018/2019  . On home oxygen therapy    2 L [place] at bedtime  . Osteoporosis   . Overweight (BMI 25.0-29.9)    BMI 29  . Peripheral neuropathy   . RA (rheumatoid arthritis) ([name])    [name]  . Seropositive rheumatoid arthritis ([place])   . Type II diabetes mellitus ([name])    diet controlled  . Wears glasses   . Wears partial dentures    upper    Family History  Problem Relation Age of Onset  . COPD Mother   . Heart disease Father   . Heart attack Father   . Stomach cancer Brother   . Stroke Sister    . Alcohol abuse Sister   . Colon cancer Brother 25  . Diabetes Brother   . Rectal cancer Neg Hx    Past Surgical History:  Procedure Laterality Date  . BALLOON DILATION N/A 09/12/2018   Procedure: BALLOON DILATION;  Surgeon: Irene Shipper, MD;  Location: [facility];  Service: Endoscopy;  Laterality: N/A;  . BOTOX INJECTION N/A 09/12/2018   Procedure: BOTOX INJECTION;  Surgeon: Irene Shipper, MD;  Location: [facility];  Service: Endoscopy;  Laterality: N/A;  . BOTOX INJECTION N/A 11/27/2018   Procedure: BOTOX INJECTION;  Surgeon: Irene Shipper, MD;  Location: [facility];  Service: Endoscopy;  Laterality: N/A;  . CARDIAC CATHETERIZATION  08/2004   CP- no MI, Cath- small vessell disease   . CARDIAC CATHETERIZATION  12/31/2011   80% distal LM, 100% native LAD, LCx and RCA, 30% prox SVG-OM, SVG-D1 normal, 99% distal, 80% ostial SVG-RCA distal to graft, LIMA-LAD normal; LVEF mildly decreased with posterior basal AK   . CARDIAC CATHETERIZATION  2009   with patent grafts/notes 12/31/2011  . CARDIAC CATHETERIZATION N/A 08/13/2015   Procedure: Left Heart Cath and Cors/Grafts  Angiography;  Surgeon: Sherren Mocha, MD;  Location: [facility];  Service: Cardiovascular;  Laterality: N/A;  . CATARACT EXTRACTION W/ INTRAOCULAR LENS  IMPLANT, BILATERAL Bilateral   . CHOLECYSTECTOMY OPEN  11/2003   [name]  . CORONARY ANGIOPLASTY WITH STENT PLACEMENT  01/03/2012   Successful DES to SVG-RCA and cutting balloon angioplasty ostial  PDA   . CORONARY ANGIOPLASTY WITH STENT PLACEMENT  01/14/2014   "1"  . CORONARY ARTERY BYPASS GRAFT  11/1999   CABG X5  . CORONARY STENT PLACEMENT  02/2012   1 stent and balloon  . ESOPHAGEAL DILATION  11/27/2018   Procedure: ESOPHAGEAL DILATION;  Surgeon: Irene Shipper, MD;  Location: [facility];  Service: Endoscopy;;  . ESOPHAGOGASTRODUODENOSCOPY N/A 03/01/2017   Procedure: ESOPHAGOGASTRODUODENOSCOPY (EGD);  Surgeon: Irene Shipper, MD;  Location: [facility];   Service: Endoscopy;  Laterality: N/A;  . ESOPHAGOGASTRODUODENOSCOPY (EGD) WITH ESOPHAGEAL DILATION  2010  . ESOPHAGOGASTRODUODENOSCOPY (EGD) WITH PROPOFOL N/A 09/12/2018   Procedure: ESOPHAGOGASTRODUODENOSCOPY (EGD) WITH PROPOFOL;  Surgeon: Irene Shipper, MD;  Location: [facility];  Service: Endoscopy;  Laterality: N/A;  . ESOPHAGOGASTRODUODENOSCOPY (EGD) WITH PROPOFOL N/A 11/27/2018   Procedure: ESOPHAGOGASTRODUODENOSCOPY (EGD) WITH PROPOFOL, WITH BALLOON DILATION;  Surgeon: Irene Shipper, MD;  Location: [facility];  Service: Endoscopy;  Laterality: N/A;  . HAND SURGERY     bilateral carpal tunnel releases  . JOINT REPLACEMENT    . KNEE ARTHROSCOPY Right 2008  . LEFT AND RIGHT HEART CATHETERIZATION WITH CORONARY ANGIOGRAM  12/31/2011   Procedure: LEFT AND RIGHT HEART CATHETERIZATION WITH CORONARY ANGIOGRAM;  Surgeon: Burnell Blanks, MD;  Location: [facility];  Service: Cardiovascular;;  . LEFT AND RIGHT HEART CATHETERIZATION WITH CORONARY ANGIOGRAM N/A 01/14/2014   Procedure: LEFT AND RIGHT HEART CATHETERIZATION WITH CORONARY ANGIOGRAM;  Surgeon: Peter M Martinique, MD;  Location: [facility] C[facility] LAB;  Service: Cardiovascular;  Laterality: N/A;  . MALONEY DILATION  03/01/2017   Procedure: Venia Minks DILATION;  Surgeon: Irene Shipper, MD;  Location: [facility];  Service: Endoscopy;;  . PERCUTANEOUS CORONARY INTERVENTION-BALLOON ONLY  01/03/2012   Procedure: PERCUTANEOUS CORONARY INTERVENTION-BALLOON ONLY;  Surgeon: Peter M Martinique, MD;  Location[facility] C[facility] LAB;  Service: Cardiovascular;;  . PERCUTANEOUS CORONARY STENT INTERVENTION (PCI-S)  12/31/2011   Procedure: PERCUTANEOUS CORONARY STENT INTERVENTION (PCI-S);  Surgeon: Burnell Blanks, MD;  Location: [facility] CATH LAB;  Service: Cardiovascular;;  . PERCUTANEOUS CORONARY STENT INTERVENTION (PCI-S) N/A 01/03/2012   Procedure: PERCUTANEOUS CORONARY STENT INTERVENTION (PCI-S);  Surgeon: Peter M Martinique, MD;  Location: [facility] CATH LAB;  Service: Cardiovascular;   Laterality: N/A;  . SHOULDER ARTHROSCOPY WITH OPEN ROTATOR CUFF REPAIR AND DISTAL CLAVICLE ACROMINECTOMY Left 02/27/2013   Procedure: LEFT SHOULDER ARTHROSCOPY WITH MINI OPEN ROTATOR CUFF REPAIR AND SUBACROMIAL DECOMPRESSION AND DISTAL CLAVICLE RESECTION;  Surgeon: [name], MD;  Location: [facility];  Service: Orthopedics;  Laterality: Left;  . TOTAL KNEE ARTHROPLASTY Right 03/2010   [name]  . TRIGGER FINGER RELEASE Left 02/27/2013   Procedure: RELEASE TRIGGER FINGER/A-1 PULLEY;  Surgeon: [name], MD;  Location: [facility];  Service: Orthopedics;  Laterality: Left;   Social History   Social History Narrative   No living will   Requests wife as health care POA-- alternate is daughter Hassan Rowan   Discussed DNR --he requests this (done 08/29/12)   Not sure about feeding tube---but might accept for some time   Patient lives with wife and daughter in a one story home.  Has 3 children.  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   Immunization History  Administered Date(s) Administered  . H1N1 03/27/2008  . Influenza Split 12/29/2010, 01/18/2012  . Influenza Whole 02/03/2007, 01/09/2008, 01/01/2009, 12/31/2009  . Influenza,inj,Quad PF,6+ Mos 12/13/2012, 01/15/2014, 01/15/2015, 01/06/2016, 01/03/2017, 02/09/2018, 01/30/2019, 01/05/2020  . PFIZER SARS-COV-2 Vaccination 04/26/2019, 05/17/2019  . Pneumococcal Conjugate-13 09/10/2013  . Pneumococcal Polysaccharide-23 03/05/1997, 08/17/2011  . Td 03/05/1997, 08/17/2011     Objective: Vital Signs: There were no vitals taken for this visit.   Physical Exam   Musculoskeletal Exam: ***  CDAI Exam: CDAI Score: -- Patient Global: --; Provider Global: -- Swollen: --; Tender: -- Joint Exam 01/25/2020   No joint exam has been documented for this visit   There is currently no information documented on the homunculus. Go to the Rheumatology activity and complete the homunculus joint exam.  Investigation: No additional  findings.  Imaging: No results found.  Recent Labs: Lab Results  Component Value Date   WBC 7.0 06/13/2019   HGB 14.1 06/13/2019   PLT 199.0 06/13/2019   NA 133 (L) 06/13/2019   K 4.6 06/13/2019   CL 96 06/13/2019   CO2 33 (H) 06/13/2019   GLUCOSE 287 (H) 06/13/2019   BUN 33 (H) 06/13/2019   CREATININE 2.06 (H) 06/13/2019   BILITOT 0.8 06/13/2019   ALKPHOS 58 06/13/2019   AST 14 06/13/2019   ALT 9 06/13/2019   PROT 8.0 06/13/2019   ALBUMIN 4.1 06/13/2019   ALBUMIN 4.1 06/13/2019   CALCIUM 9.9 06/13/2019   GFRAA 30 (L) 06/24/2017    Speciality Comments: No specialty comments available.  Procedures:  No procedures performed Allergies: Doxazosin mesylate and Methocarbamol   Assessment / Plan:     Visit Diagnoses: Seropositive rheumatoid arthritis ([place])  ILD (interstitial lung disease) ([place])  High risk medication use  Paresthesia of both hands  History of right knee joint replacement  Primary osteoarthritis of left knee  Spondylosis without myelopathy or radiculopathy, lumbar region  Chronic diastolic heart failure ([name])  CKD (chronic kidney disease) stage 4, GFR 15-29 ml/min ([name])  History of coronary artery disease  History of diabetes mellitus  History of hyperlipidemia  History of sleep apnea  History of gastroesophageal reflux (GERD)  History of diverticulosis  History of hypertension  Orders: No orders of the defined types were placed in this encounter.  No orders of the defined types were placed in this encounter.   Face-to-face time spent with patient was *** minutes. Greater than 50% of time was spent in counseling and coordination of care.  Follow-Up Instructions: No follow-ups on file.   Ofilia Neas, PA-C  Note - This record has been created using Dragon software.  Chart creation errors have been sought, but may not always  have been located. Such creation errors do not reflect on  the standard of medical care.

## 2020-01-14 NOTE — Progress Notes (Signed)
CARDIOLOGY OFFICE NOTE  Date:  01/22/2020    [name] Date of Birth: Jan 03, 1932 Medical Record #[id number]  PCP:  [name], MD  Cardiologist:  Johnsie Cancel    Chief Complaint  Patient presents with  . Follow-up    Seen for Dr. Johnsie Cancel    History of Present Illness: [name] is a 86 y.o. male who presents today for a 14 month check. Seen for Dr. Johnsie Cancel.   He has a history of known CAD with prior CABG and prior DES to SVG to RCA and PCI/cutting balloon to PD in 2013. Had DES to SVG to OM2 in 2015 and another DES to SVG to OM1/OM2 in 2017. He has chronic fatigue and dyspnea with ILD, RA, DM, HTN, HLD, GERD and mild AS with last echo in 2019. He has botox injections for dysphagia per [name].   Last seen in August of 2020 by telehealth visit with Dr. Johnsie Cancel.   Comes in today. Here alone. Little early in the day for him. He needs Plavix refilled. No bleeding/bruising. He has had recent labs by both Renal and Rheumatology per his report. He is "making the rounds" - seeing all his doctors over the next few weeks. No chest pain. He notes an occasional palpitation but nothing that sounds bothersome to him. He has chronic pain. He has had some chronic nausea in the morning - feels like "morning sickness" to him - his PPI was changed - this helped some - he sees Gi in about 2 weeks. He is not dizzy. No syncope noted. Overall, he feels like he is ok from our standpoint. Using oxygen at night.   Past Medical History:  Diagnosis Date  . Arthritis    osteoarthritis, s/p R TKR, and digits  . CAD (coronary artery disease)    a. s/p CABG (2001)  b. s/p DES to RCA and cutting POBA to ostial PDA (2013)   c. s/p DES to SVG to OM2 (01/14/14) d. cath: 08/2015 NSTEMI w/ patent LIMA-LAD and 99% stenosis of SVG-OM w/ DES placed. CTO of SVG-RCA and SVG-D1.   . Cataract   . Chronic diastolic CHF (congestive heart failure) ([place])    a) 09/13 ECHO- LVEF 88-91%, grade 1 diastolic  dysfunction, mild LA dilatation, atrial septal aneurysm, AV mobility restricted, but no sig AS by doppler; b) 09/04/08 ECHO- LVH, ef 60%, mild AS, c. echo 08/2015: EF perserved of 55-60% with inferolateral HK. Mild AS noted.  . Chronic kidney disease, stage III (moderate) ([place])   . Chronic lower back pain   . Colon polyps   . COVID-19   . Diverticulosis   . Dyspnea 2009 since July -Sept   05/06/08-CPST-  normal effort, reduced VO2 max 20.5 /65%, reduced at 8.2/ 40%, normal breathing resetvca of 55%, submaximal heart rate response 112/77%, flattened o2 pluse response at peak exercise-12 ml/beat @ 85%, No VQ mismatch abnormalities, All c/w CIRC Limitation  . Enlarged prostate   . Esophageal stricture    a. s/p dilation spring 2010  . GERD (gastroesophageal reflux disease)   . Heart murmur   . Hiatal hernia   . History of carpal tunnel syndrome    Bilateral  . History of kidney stones   . History of PFTs    mixed pattern on spiro. mild restn on lung volumes with near normal DLCO. Pattern can be explained by CABG scar. Fev1 2.2L/73%, ratio 68 (67), TLC 4.7/68%,RV 1.5L/55%,DLCO 79%  . Hyperlipidemia   .  Hypertension   . Interstitial lung disease ([name])    NOS  . Iron deficiency anemia   . Nausea & vomiting    2018/2019  . On home oxygen therapy    2 L [place] at bedtime  . Osteoporosis   . Overweight (BMI 25.0-29.9)    BMI 29  . Peripheral neuropathy   . RA (rheumatoid arthritis) ([name])    [name]  . Seropositive rheumatoid arthritis ([place])   . Type II diabetes mellitus ([name])    diet controlled  . Wears glasses   . Wears partial dentures    upper    Past Surgical History:  Procedure Laterality Date  . BALLOON DILATION N/A 09/12/2018   Procedure: BALLOON DILATION;  Surgeon: Irene Shipper, MD;  Location: [facility];  Service: Endoscopy;  Laterality: N/A;  . BOTOX INJECTION N/A 09/12/2018   Procedure: BOTOX INJECTION;  Surgeon: Irene Shipper, MD;  Location: [facility];  Service:  Endoscopy;  Laterality: N/A;  . BOTOX INJECTION N/A 11/27/2018   Procedure: BOTOX INJECTION;  Surgeon: Irene Shipper, MD;  Location: [facility];  Service: Endoscopy;  Laterality: N/A;  . CARDIAC CATHETERIZATION  08/2004   CP- no MI, Cath- small vessell disease   . CARDIAC CATHETERIZATION  12/31/2011   80% distal LM, 100% native LAD, LCx and RCA, 30% prox SVG-OM, SVG-D1 normal, 99% distal, 80% ostial SVG-RCA distal to graft, LIMA-LAD normal; LVEF mildly decreased with posterior basal AK   . CARDIAC CATHETERIZATION  2009   with patent grafts/notes 12/31/2011  . CARDIAC CATHETERIZATION N/A 08/13/2015   Procedure: Left Heart Cath and Cors/Grafts Angiography;  Surgeon: Sherren Mocha, MD;  Location: [facility];  Service: Cardiovascular;  Laterality: N/A;  . CATARACT EXTRACTION W/ INTRAOCULAR LENS  IMPLANT, BILATERAL Bilateral   . CHOLECYSTECTOMY OPEN  11/2003   [name]  . CORONARY ANGIOPLASTY WITH STENT PLACEMENT  01/03/2012   Successful DES to SVG-RCA and cutting balloon angioplasty ostial  PDA   . CORONARY ANGIOPLASTY WITH STENT PLACEMENT  01/14/2014   "1"  . CORONARY ARTERY BYPASS GRAFT  11/1999   CABG X5  . CORONARY STENT PLACEMENT  02/2012   1 stent and balloon  . ESOPHAGEAL DILATION  11/27/2018   Procedure: ESOPHAGEAL DILATION;  Surgeon: Irene Shipper, MD;  Location: [facility];  Service: Endoscopy;;  . ESOPHAGOGASTRODUODENOSCOPY N/A 03/01/2017   Procedure: ESOPHAGOGASTRODUODENOSCOPY (EGD);  Surgeon: Irene Shipper, MD;  Location: [facility];  Service: Endoscopy;  Laterality: N/A;  . ESOPHAGOGASTRODUODENOSCOPY (EGD) WITH ESOPHAGEAL DILATION  2010  . ESOPHAGOGASTRODUODENOSCOPY (EGD) WITH PROPOFOL N/A 09/12/2018   Procedure: ESOPHAGOGASTRODUODENOSCOPY (EGD) WITH PROPOFOL;  Surgeon: Irene Shipper, MD;  Location: [facility];  Service: Endoscopy;  Laterality: N/A;  . ESOPHAGOGASTRODUODENOSCOPY (EGD) WITH PROPOFOL N/A 11/27/2018   Procedure: ESOPHAGOGASTRODUODENOSCOPY (EGD) WITH PROPOFOL,  WITH BALLOON DILATION;  Surgeon: Irene Shipper, MD;  Location: [facility];  Service: Endoscopy;  Laterality: N/A;  . HAND SURGERY     bilateral carpal tunnel releases  . JOINT REPLACEMENT    . KNEE ARTHROSCOPY Right 2008  . LEFT AND RIGHT HEART CATHETERIZATION WITH CORONARY ANGIOGRAM  12/31/2011   Procedure: LEFT AND RIGHT HEART CATHETERIZATION WITH CORONARY ANGIOGRAM;  Surgeon: Burnell Blanks, MD;  Location: [facility] CATH LAB;  Service: Cardiovascular;;  . LEFT AND RIGHT HEART CATHETERIZATION WITH CORONARY ANGIOGRAM N/A 01/14/2014   Procedure: LEFT AND RIGHT HEART CATHETERIZATION WITH CORONARY ANGIOGRAM;  Surgeon: Peter M Martinique, MD;  Location: [facility] CATH LAB;  Service: Cardiovascular;  Laterality: N/A;  Venia Minks DILATION  03/01/2017   Procedure: Venia Minks DILATION;  Surgeon: Irene Shipper, MD;  Location: [facility];  Service: Endoscopy;;  . PERCUTANEOUS CORONARY INTERVENTION-BALLOON ONLY  01/03/2012   Procedure: PERCUTANEOUS CORONARY INTERVENTION-BALLOON ONLY;  Surgeon: Peter M Martinique, MD;  Location: [facility] CATH LAB;  Service: Cardiovascular;;  . PERCUTANEOUS CORONARY STENT INTERVENTION (PCI-S)  12/31/2011   Procedure: PERCUTANEOUS CORONARY STENT INTERVENTION (PCI-S);  Surgeon: Burnell Blanks, MD;  Location: [facility] CATH LAB;  Service: Cardiovascular;;  . PERCUTANEOUS CORONARY STENT INTERVENTION (PCI-S) N/A 01/03/2012   Procedure: PERCUTANEOUS CORONARY STENT INTERVENTION (PCI-S);  Surgeon: Peter M Martinique, MD;  Location: [facility] CATH LAB;  Service: Cardiovascular;  Laterality: N/A;  . SHOULDER ARTHROSCOPY WITH OPEN ROTATOR CUFF REPAIR AND DISTAL CLAVICLE ACROMINECTOMY Left 02/27/2013   Procedure: LEFT SHOULDER ARTHROSCOPY WITH MINI OPEN ROTATOR CUFF REPAIR AND SUBACROMIAL DECOMPRESSION AND DISTAL CLAVICLE RESECTION;  Surgeon: [name], MD;  Location: [place];  Service: Orthopedics;  Laterality: Left;  . TOTAL KNEE ARTHROPLASTY Right 03/2010   [name]  . TRIGGER FINGER RELEASE Left 02/27/2013    Procedure: RELEASE TRIGGER FINGER/A-1 PULLEY;  Surgeon: [name], MD;  Location: [place];  Service: Orthopedics;  Laterality: Left;     Medications: Current Meds  Medication Sig  . Alpha-Lipoic Acid 600 MG CAPS Take 1 capsule (600 mg total) by mouth daily.  [name] amLODipine (NORVASC) 5 MG tablet Take 5 mg by mouth daily.   [name] aspirin EC 81 MG tablet Take 81 mg by mouth at bedtime.  . Blood Glucose Monitoring Suppl (ONE TOUCH ULTRA 2) w/Device KIT Use to obtain blood sugar daily. Dx Code E11.40  . carvedilol (COREG) 6.25 MG tablet Take 6.25 mg by mouth 2 (two) times daily.  . clopidogrel (PLAVIX) 75 MG tablet Take 1 tablet (75 mg total) by mouth daily.  [name] DHA-Vitamin C-Lutein (EYE HEALTH FORMULA PO) Take 1 tablet by mouth daily.   . furosemide (LASIX) 40 MG tablet Take 40 mg by mouth at bedtime. Monday, Wednesday, Friday.  [name] glucose blood (ONE TOUCH ULTRA TEST) test strip USE TO CHECK BLOOD SUGAR ONCE A DAY Dx Code E11.40  . [START ON 01/24/2020] HYDROcodone-acetaminophen (NORCO) 10-325 MG tablet Take 1 tablet by mouth every 4 (four) hours as needed for severe pain. Must last 30 days.  [name] losartan (COZAAR) 25 MG tablet Take 25 mg by mouth daily.  . nitroGLYCERIN (NITROSTAT) 0.4 MG SL tablet DISSOLVE 1 TABLET UNDER TONGUE AS NEEDEDFOR CHEST PAIN. MAY REPEAT 5 MINUTES APART 3 TIMES IF NEEDED. IF NO RELIEF CALL 911  . ondansetron (ZOFRAN) 4 MG tablet TAKE 1 TABLET BY MOUTH EVERY 8 HOURS AS NEEDED FOR NAUSEA OR VOMITING  . ONETOUCH DELICA LANCETS 99I MISC 1 each by In Vitro route daily. Dx Code E11.49  . oxyCODONE ER (XTAMPZA ER) 9 MG C12A Take 1 capsule by mouth every 12 (twelve) hours. For chronic pain syndrome. Long acting opioid analgesic.  [name] OXYGEN Inhale into the lungs. Per pt- Uses Oxygen 2 liters at night.  . pantoprazole (PROTONIX) 40 MG tablet Take 1 tablet (40 mg total) by mouth daily.  . polyethylene glycol (MIRALAX / GLYCOLAX) 17 g packet Take 51 g by mouth daily.   . Semaglutide  3 MG TABS Take 3 mg by mouth daily before breakfast.  . tamsulosin (FLOMAX) 0.4 MG CAPS capsule Take 0.4 mg by mouth daily.  . [DISCONTINUED] clopidogrel (PLAVIX) 75 MG tablet Take 1 tablet (75 mg total) by  mouth daily. Please keep upcoming appt in October before anymore refills. Thank you     Allergies: Allergies  Allergen Reactions  . Doxazosin Mesylate Other (See Comments)    dizziness  . Methocarbamol Rash    Social History: The patient  reports that he quit smoking about 56 years ago. His smoking use included cigarettes. He has a 20.00 pack-year smoking history. He has quit using smokeless tobacco. He reports that he does not drink alcohol and does not use drugs.   Family History: The patient's family history includes Alcohol abuse in his sister; COPD in his mother; Colon cancer (age of onset: 30) in his brother; Diabetes in his brother; Heart attack in his father; Heart disease in his father; Stomach cancer in his brother; Stroke in his sister.   Review of Systems: Please see the history of present illness.   All other systems are reviewed and negative.   Physical Exam: VS:  BP 132/78 (BP Location: Left Arm, Patient Position: Sitting, Cuff Size: Normal)   Pulse 66   Ht 6' (1.829 m)   Wt 200 lb (90.7 kg)   SpO2 97%   BMI 27.12 kg/m  .  BMI Body mass index is 27.12 kg/m.  Wt Readings from Last 3 Encounters:  01/22/20 200 lb (90.7 kg)  01/21/20 202 lb (91.6 kg)  12/14/19 200 lb (90.7 kg)    General: Elderly. Alert and in no acute distress.   Cardiac: Regular rate and rhythm. Outflow murmur noted. No edema.  Respiratory:  Lungs are clear to auscultation bilaterally with normal work of breathing.  GI: Soft and nontender.  MS: No deformity or atrophy. Gait and ROM intact.  Skin: Warm and dry. Color is normal.  Neuro:  Strength and sensation are intact and no gross focal deficits noted.  Psych: Alert, appropriate and with normal affect.   LABORATORY DATA:  EKG:  EKG  is not ordered today.    Lab Results  Component Value Date   WBC 7.0 06/13/2019   HGB 14.1 06/13/2019   HCT 41.4 06/13/2019   PLT 199.0 06/13/2019   GLUCOSE 287 (H) 06/13/2019   CHOL 209 (H) 06/13/2019   TRIG 275.0 (H) 06/13/2019   HDL 35.50 (L) 06/13/2019   LDLDIRECT 125.0 06/13/2019   LDLCALC 43 10/14/2016   ALT 9 06/13/2019   AST 14 06/13/2019   NA 133 (L) 06/13/2019   K 4.6 06/13/2019   CL 96 06/13/2019   CREATININE 2.06 (H) 06/13/2019   BUN 33 (H) 06/13/2019   CO2 33 (H) 06/13/2019   TSH 2.100 04/08/2017   PSA 1.83 02/17/2009   INR 1.12 08/13/2015   HGBA1C 8.1 (A) 12/14/2019   MICROALBUR 7.5 (H) 04/28/2018     BNP (last 3 results) No results for input(s): BNP in the last 8760 hours.  ProBNP (last 3 results) No results for input(s): PROBNP in the last 8760 hours.   Other Studies Reviewed Today:  Echo Study Conclusions 01/2018  - Left ventricle: The cavity size was normal. Wall thickness was  increased in a pattern of moderate LVH. Systolic function was  normal. The estimated ejection fraction was in the range of 50%  to 55%. There is akinesis of the inferior myocardium. Doppler  parameters are consistent with abnormal left ventricular  relaxation (grade 1 diastolic dysfunction).  - Aortic valve: Valve mobility was restricted. There was mild  stenosis. There was trivial regurgitation.  - Mitral valve: Calcified annulus.  - Atrial septum: There was an atrial septal  aneurysm.  - Pericardium, extracardiac: A trivial pericardial effusion was  identified.    Left Heart Cath and Cors/Grafts Angiography 08/2015  Conclusion   Prox RCA lesion, 100% stenosed.  LIMA .  The LIMA to LAD graft is widely patent without stenosis.  Sequential SVG .  The sequential saphenous vein graft to OM1 and OM 2 has critical stenosis within the previously stented segment in the proximal body of the graft.  SVG .  Graft known to be totally occluded, not  selectively injected  Origin lesion, 100% stenosed.  SVG .  The graft is chronically occluded  Prox Graft lesion, 100% stenosed.  Prox LAD lesion, 100% stenosed.  Ost LM lesion, 80% stenosed.  Ost 2nd Mrg to 2nd Mrg lesion, 90% stenosed.  Prox Graft to Mid Graft lesion, before 1st Mrg, 99% stenosed. Post intervention, there is a 0% residual stenosis. The lesion was previously treated with a stent (unknown type).   1. Severe native three-vessel coronary artery disease with severe left main stenosis, total occlusion of the LAD, total occlusion of the RCA, and severe native left circumflex stenosis  2. Status post aortocoronary bypass surgery with continued patency of the LIMA to LAD and continued patency of the saphenous vein graft to OM with severe stenosis in the proximal body of the graft (in-stent)  3. Chronic total occlusion of the saphenous graft RCA and saphenous vein graft to diagonal  4. Successful PCI of the saphenous vein graft OM with placement of a drug-eluting stent(treatment of in-stent restenosis)  Recommendation: dual antiplatelet therapy with aspirin and Plavix at least 12 months. Consider long-term therapy if tolerated in this patient with previous bypass grafting who has required multiple PCI procedures.   ASSESSMENT & PLAN:    1. CAD with remote CABG and multiple interventions - last in May of 2017 - he is managed medically - no worrisome symptoms - would favor continued medical therapy - he seems to be in overall declining health to me.   2. ILD - chronic DOE - this seems stable - has upcoming pulmonary visit planned.   3. Chronic dysphagia - getting botox injections per GI and dilatation - now with some "morning sickness nausea" - seeing GI soon - remains on PPI  4. HTN - BP is fine on his current regimen - no changes made today.   5. CKD - followed by Renal  6. Advanced age - seems to be in slow decline but overall seems to be holding his own. No  worrisome issues noted. He has had recent labs per his report.   7. Mild AS - no cardinal symptoms noted. Would favor conservative management.   Current medicines are reviewed with the patient today.  The patient does not have concerns regarding medicines other than what has been noted above.  The following changes have been made:  See above.  Labs/ tests ordered today include:   No orders of the defined types were placed in this encounter.    Disposition:   FU with Dr. Johnsie Cancel in 6 months. Plavix is refilled today.   Patient is agreeable to this plan and will call if any problems develop in the interim.   SignedTruitt Merle, NP  01/22/2020 8:33 AM  [place] [address] [place], [place]  [address] Phone: [phone] Fax: [phone]

## 2020-01-16 DIAGNOSIS — J8410 Pulmonary fibrosis, unspecified: Secondary | ICD-10-CM | POA: Diagnosis not present

## 2020-01-16 DIAGNOSIS — R0689 Other abnormalities of breathing: Secondary | ICD-10-CM | POA: Diagnosis not present

## 2020-01-17 NOTE — Progress Notes (Signed)
Office Visit Note  Patient: [name]             Date of Birth: 04/27/1931           MRN: [id number]             PCP: [name], MD Referring: [name], MD Visit Date: 01/21/2020 Occupation: @GUAROCC @  Subjective:  Follow-up on rheumatoid arthritis.   History of Present Illness: [name] is a 86 y.o. male with history of rheumatoid arthritis, ILD and osteoarthritis.  He denies any joint pain or joint swelling.  He is not having any stiffness currently.  He was having some symptoms of carpal tunnel syndrome which is improved since he has been using braces.  He states since he stopped the medications for rheumatoid arthritis his GI symptoms have resolved.  He continues to have some shortness of breath.  He has no appointment coming up with Dr. Chase Caller and would like to establish an appointment.  Activities of Daily Living:  Patient reports morning stiffness for 0 minutes.   Patient Denies nocturnal pain.  Difficulty dressing/grooming: Denies Difficulty climbing stairs: Denies Difficulty getting out of chair: Denies Difficulty using hands for taps, buttons, cutlery, and/or writing: Denies  Review of Systems  Constitutional: Negative for fatigue.  HENT: Positive for mouth dryness. Negative for mouth sores and nose dryness.   Eyes: Positive for itching and visual disturbance. Negative for pain and dryness.  Respiratory: Positive for shortness of breath and difficulty breathing. Negative for cough and hemoptysis.        Patient complains of SOB and difficulty breathing, no worse than normal.   Cardiovascular: Negative for chest pain, palpitations and swelling in legs/feet.  Gastrointestinal: Positive for constipation. Negative for abdominal pain, blood in stool and diarrhea.  Endocrine: Negative for increased urination.  Genitourinary: Negative for painful urination.  Musculoskeletal: Negative for arthralgias, joint pain, joint swelling, myalgias, muscle  weakness, morning stiffness, muscle tenderness and myalgias.  Skin: Negative for color change, rash and redness.  Allergic/Immunologic: Negative for susceptible to infections.  Neurological: Positive for dizziness and numbness. Negative for headaches, memory loss and weakness.       Occasional dizziness, PCP is aware.   Hematological: Negative for swollen glands.  Psychiatric/Behavioral: Negative for confusion and sleep disturbance.    PMFS History:  Patient Active Problem List   Diagnosis Date Noted  . Chronic narcotic dependence ([place]) 12/14/2019  . Callus of foot 11/08/2019  . Plantar flexed metatarsal bone of left foot 11/08/2019  . COVID-19 virus infection 04/12/2019  . Long term prescription opiate use 12/14/2018  . Neuropathy 12/14/2018  . CKD stage 3 due to type 2 diabetes mellitus ([place]) 12/14/2018  . Problems with swallowing and mastication   . Achalasia   . Right inguinal hernia 07/04/2018  . Advance directive discussed with patient 04/28/2018  . Chronic pain of both lower extremities 10/26/2017  . Neuropathic pain 10/26/2017  . Chronic pain syndrome 10/26/2017  . Iron deficiency anemia 07/05/2017  . Constipation 07/05/2017  . CKD (chronic kidney disease) stage 4, GFR 15-29 ml/min ([name]) 05/17/2017  . Seropositive rheumatoid arthritis ([place]) 05/17/2017  . DM (diabetes mellitus) type II controlled, neurological manifestation ([place]) 05/17/2017  . Atherosclerotic heart disease of native coronary artery with angina pectoris ([place]) 05/17/2017  . Dysphagia 05/17/2017  . High risk medication use 10/21/2016  . Primary osteoarthritis of both knees 10/21/2016  . History of right knee joint replacement 10/21/2016  . Orthostatic hypotension 10/01/2016  .  Tegretol-induced dizziness 05/14/2016  . Spinal stenosis of lumbar region without neurogenic claudication 03/23/2016  . Lumbar spondylosis 03/23/2016  . Respiratory failure, chronic ([place]) 11/21/2014  . Rheumatoid arthritis ([place])  11/05/2014  . Rheumatic fever without heart involvement 03/04/2014  . Ventral hernia 12/17/2013  . Routine general medical examination at a health care facility 08/29/2012  . Routine history and physical examination of adult 08/29/2012  . ILD (interstitial lung disease) ([place]) 11/28/2011  . Chronic diastolic heart failure ([place]) 09/14/2011  . Diabetic polyneuropathy associated with type 2 diabetes mellitus ([place]) 08/10/2010  . Obstructive sleep apnea 12/17/2008  . ESOPHAGEAL STRICTURE 10/09/2008  . Esophageal stricture 10/09/2008  . Reflux esophagitis 09/10/2008  . Diverticulosis of colon 09/10/2008  . Gastro-esophageal reflux disease with esophagitis 09/10/2008  . Coronary atherosclerosis 03/19/2008  . Aortic valve disorder 03/19/2008  . Thoracic aorta atherosclerosis ([place]) 03/19/2008  . Actinic keratosis 10/23/2007  . SLEEP DISORDER, CHRONIC 10/17/2006  . Disturbance in sleep behavior 10/17/2006  . 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 Essential hypertension 09/23/2006  . GERD 09/23/2006  . BENIGN PROSTATIC HYPERTROPHY 09/23/2006  . Enlarged prostate without lower urinary tract symptoms (luts) 09/23/2006  . HLD (hyperlipidemia) 09/23/2006    Past Medical History:  Diagnosis Date  . Arthritis    osteoarthritis, s/p R TKR, and digits  . CAD (coronary artery disease)    a. s/p CABG (2001)  b. s/p DES to RCA and cutting POBA to ostial PDA (2013)   c. s/p DES to SVG to OM2 (01/14/14) d. cath: 08/2015 NSTEMI w/ patent LIMA-LAD and 99% stenosis of SVG-OM w/ DES placed. CTO of SVG-RCA and SVG-D1.   . Cataract   . Chronic diastolic CHF (congestive heart failure) ([place])    a) 09/13 ECHO- LVEF 67-89%, grade 1 diastolic dysfunction, mild LA dilatation, atrial septal aneurysm, AV mobility restricted, but no sig AS by doppler; b) 09/04/08 ECHO- LVH, ef 60%, mild AS, c. echo 08/2015: EF perserved of 55-60% with inferolateral HK. Mild AS noted.  . Chronic kidney disease,  stage III (moderate) ([place])   . Chronic lower back pain   . Colon polyps   . COVID-19   . Diverticulosis   . Dyspnea 2009 since July -Sept   05/06/08-CPST-  normal effort, reduced VO2 max 20.5 /65%, reduced at 8.2/ 40%, normal breathing resetvca of 55%, submaximal heart rate response 112/77%, flattened o2 pluse response at peak exercise-12 ml/beat @ 85%, No VQ mismatch abnormalities, All c/w CIRC Limitation  . Enlarged prostate   . Esophageal stricture    a. s/p dilation spring 2010  . GERD (gastroesophageal reflux disease)   . Heart murmur   . Hiatal hernia   . History of carpal tunnel syndrome    Bilateral  . History of kidney stones   . History of PFTs    mixed pattern on spiro. mild restn on lung volumes with near normal DLCO. Pattern can be explained by CABG scar. Fev1 2.2L/73%, ratio 68 (67), TLC 4.7/68%,RV 1.5L/55%,DLCO 79%  . Hyperlipidemia   . Hypertension   . Interstitial lung disease ([name])    NOS  . Iron deficiency anemia   . Nausea & vomiting    2018/2019  . On home oxygen therapy    2 L [place] at bedtime  . Osteoporosis   . Overweight (BMI 25.0-29.9)    BMI 29  . Peripheral neuropathy   . RA (rheumatoid arthritis) ([name])    [name]  . Seropositive rheumatoid arthritis ([place])   .  Type II diabetes mellitus ([name])    diet controlled  . Wears glasses   . Wears partial dentures    upper    Family History  Problem Relation Age of Onset  . COPD Mother   . Heart disease Father   . Heart attack Father   . Stomach cancer Brother   . Stroke Sister   . Alcohol abuse Sister   . Colon cancer Brother 25  . Diabetes Brother   . Rectal cancer Neg Hx    Past Surgical History:  Procedure Laterality Date  . BALLOON DILATION N/A 09/12/2018   Procedure: BALLOON DILATION;  Surgeon: Irene Shipper, MD;  Location: [facility];  Service: Endoscopy;  Laterality: N/A;  . BOTOX INJECTION N/A 09/12/2018   Procedure: BOTOX INJECTION;  Surgeon: Irene Shipper, MD;  Location: [facility];   Service: Endoscopy;  Laterality: N/A;  . BOTOX INJECTION N/A 11/27/2018   Procedure: BOTOX INJECTION;  Surgeon: Irene Shipper, MD;  Location: [facility];  Service: Endoscopy;  Laterality: N/A;  . CARDIAC CATHETERIZATION  08/2004   CP- no MI, Cath- small vessell disease   . CARDIAC CATHETERIZATION  12/31/2011   80% distal LM, 100% native LAD, LCx and RCA, 30% prox SVG-OM, SVG-D1 normal, 99% distal, 80% ostial SVG-RCA distal to graft, LIMA-LAD normal; LVEF mildly decreased with posterior basal AK   . CARDIAC CATHETERIZATION  2009   with patent grafts/notes 12/31/2011  . CARDIAC CATHETERIZATION N/A 08/13/2015   Procedure: Left Heart Cath and Cors/Grafts Angiography;  Surgeon: Sherren Mocha, MD;  Location: [facility];  Service: Cardiovascular;  Laterality: N/A;  . CATARACT EXTRACTION W/ INTRAOCULAR LENS  IMPLANT, BILATERAL Bilateral   . CHOLECYSTECTOMY OPEN  11/2003   [name]  . CORONARY ANGIOPLASTY WITH STENT PLACEMENT  01/03/2012   Successful DES to SVG-RCA and cutting balloon angioplasty ostial  PDA   . CORONARY ANGIOPLASTY WITH STENT PLACEMENT  01/14/2014   "1"  . CORONARY ARTERY BYPASS GRAFT  11/1999   CABG X5  . CORONARY STENT PLACEMENT  02/2012   1 stent and balloon  . ESOPHAGEAL DILATION  11/27/2018   Procedure: ESOPHAGEAL DILATION;  Surgeon: Irene Shipper, MD;  Location: [facility];  Service: Endoscopy;;  . ESOPHAGOGASTRODUODENOSCOPY N/A 03/01/2017   Procedure: ESOPHAGOGASTRODUODENOSCOPY (EGD);  Surgeon: Irene Shipper, MD;  Location: [facility];  Service: Endoscopy;  Laterality: N/A;  . ESOPHAGOGASTRODUODENOSCOPY (EGD) WITH ESOPHAGEAL DILATION  2010  . ESOPHAGOGASTRODUODENOSCOPY (EGD) WITH PROPOFOL N/A 09/12/2018   Procedure: ESOPHAGOGASTRODUODENOSCOPY (EGD) WITH PROPOFOL;  Surgeon: Irene Shipper, MD;  [facility]: [facility];  Service: Endoscopy;  Laterality: N/A;  . ESOPHAGOGASTRODUODENOSCOPY (EGD) WITH PROPOFOL N/A 11/27/2018   Procedure: ESOPHAGOGASTRODUODENOSCOPY (EGD) WITH  PROPOFOL, WITH BALLOON DILATION;  Surgeon: Irene Shipper, MD;  Location: [facility];  Service: Endoscopy;  Laterality: N/A;  . HAND SURGERY     bilateral carpal tunnel releases  . JOINT REPLACEMENT    . KNEE ARTHROSCOPY Right 2008  . LEFT AND RIGHT HEART CATHETERIZATION WITH CORONARY ANGIOGRAM  12/31/2011   Procedure: LEFT AND RIGHT HEART CATHETERIZATION WITH CORONARY ANGIOGRAM;  Surgeon: Burnell Blanks, MD;  Location: [facility] C[facility] LAB;  Service: Cardiovascular;;  . LEFT AND RIGHT HEART CATHETERIZATION WITH CORONARY ANGIOGRAM N/A 01/14/2014   Procedure: LEFT AND RIGHT HEART CATHETERIZATION WITH CORONARY ANGIOGRAM;  Surgeon: Peter M Martinique, MD;  Location: [facility] CATH LAB;  Service: Cardiovascular;  Laterality: N/A;  . MALONEY DILATION  03/01/2017   Procedure: Venia Minks DILATION;  Surgeon: [name]  N, MD;  Location: [facility];  Service: Endoscopy;;  . PERCUTANEOUS CORONARY INTERVENTION-BALLOON ONLY  01/03/2012   Procedure: PERCUTANEOUS CORONARY INTERVENTION-BALLOON ONLY;  Surgeon: Peter M Martinique, MD;  Location: [facility] CATH LAB;  Service: Cardiovascular;;  . PERCUTANEOUS CORONARY STENT INTERVENTION (PCI-S)  12/31/2011   Procedure: PERCUTANEOUS CORONARY STENT INTERVENTION (PCI-S);  Surgeon: Burnell Blanks, MD;  Location: [facility] CATH LAB;  Service: Cardiovascular;;  . PERCUTANEOUS CORONARY STENT INTERVENTION (PCI-S) N/A 01/03/2012   Procedure: PERCUTANEOUS CORONARY STENT INTERVENTION (PCI-S);  Surgeon: Peter M Martinique, MD;  Location: [facility] CATH LAB;  Service: Cardiovascular;  Laterality: N/A;  . SHOULDER ARTHROSCOPY WITH OPEN ROTATOR CUFF REPAIR AND DISTAL CLAVICLE ACROMINECTOMY Left 02/27/2013   Procedure: LEFT SHOULDER ARTHROSCOPY WITH MINI OPEN ROTATOR CUFF REPAIR AND SUBACROMIAL DECOMPRESSION AND DISTAL CLAVICLE RESECTION;  Surgeon: [name], MD;  Location: [facility];  Service: Orthopedics;  Laterality: Left;  . TOTAL KNEE ARTHROPLASTY Right 03/2010   [name]  . TRIGGER FINGER RELEASE Left  02/27/2013   Procedure: RELEASE TRIGGER FINGER/A-1 PULLEY;  Surgeon: [name], MD;  Location: [facility];  Service: Orthopedics;  Laterality: Left;   Social History   Social History Narrative   No living will   Requests wife as health care POA-- alternate is daughter Hassan Rowan   Discussed DNR --he requests this (done 08/29/12)   Not sure about feeding tube---but might accept for some time   Patient lives with wife and daughter in a one story home.  Has 3 children.  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   Immunization History  Administered Date(s) Administered  . H1N1 03/27/2008  . Influenza Split 12/29/2010, 01/18/2012  . Influenza Whole 02/03/2007, 01/09/2008, 01/01/2009, 12/31/2009  . Influenza,inj,Quad PF,6+ Mos 12/13/2012, 01/15/2014, 01/15/2015, 01/06/2016, 01/03/2017, 02/09/2018, 01/30/2019, 01/05/2020  . PFIZER SARS-COV-2 Vaccination 04/26/2019, 05/17/2019  . Pneumococcal Conjugate-13 09/10/2013  . Pneumococcal Polysaccharide-23 03/05/1997, 08/17/2011  . Td 03/05/1997, 08/17/2011     Objective: Vital Signs: BP 112/65 (BP Location: Left Arm, Patient Position: Sitting, Cuff Size: Small)   Pulse (!) 58   Ht 6' (1.829 m)   Wt 202 lb (91.6 kg)   BMI 27.40 kg/m    Physical Exam Vitals and nursing note reviewed.  Constitutional:      Appearance: He is well-developed.  HENT:     Head: Normocephalic and atraumatic.  Eyes:     Conjunctiva/sclera: Conjunctivae normal.     Pupils: Pupils are equal, round, and reactive to light.  Cardiovascular:     Rate and Rhythm: Normal rate and regular rhythm.     Heart sounds: Normal heart sounds.  Pulmonary:     Effort: Pulmonary effort is normal.     Breath sounds: Normal breath sounds.  Abdominal:     General: Bowel sounds are normal.     Palpations: Abdomen is soft.  Musculoskeletal:     Cervical back: Normal range of motion and neck supple.  Skin:    General: Skin is warm and dry.     Capillary Refill:  Capillary refill takes less than 2 seconds.  Neurological:     Mental Status: He is alert and oriented to person, place, and time.  Psychiatric:        Behavior: Behavior normal.      Musculoskeletal Exam: He has limited range of motion of cervical spine.  He has thoracic kyphosis.  Shoulder joints, elbow joints, wrist joints were in good range of motion.  He had no synovitis over MCPs.  PIP and DIP prominence was noted.  Hip joints had some limitation with range of motion.  Knee joint is replaced.  Left knee joint was in good range of motion without any warmth swelling or effusion.  He had no tenderness over ankles or MTPs.  CDAI Exam: CDAI Score: 0  Patient Global: 0 mm; Provider Global: 0 mm Swollen: 0 ; Tender: 0  Joint Exam 01/21/2020   No joint exam has been documented for this visit   There is currently no information documented on the homunculus. Go to the Rheumatology activity and complete the homunculus joint exam.  Investigation: No additional findings.  Imaging: No results found.  Recent Labs: Lab Results  Component Value Date   WBC 7.0 06/13/2019   HGB 14.1 06/13/2019   PLT 199.0 06/13/2019   NA 133 (L) 06/13/2019   K 4.6 06/13/2019   CL 96 06/13/2019   CO2 33 (H) 06/13/2019   GLUCOSE 287 (H) 06/13/2019   BUN 33 (H) 06/13/2019   CREATININE 2.06 (H) 06/13/2019   BILITOT 0.8 06/13/2019   ALKPHOS 58 06/13/2019   AST 14 06/13/2019   ALT 9 06/13/2019   PROT 8.0 06/13/2019   ALBUMIN 4.1 06/13/2019   ALBUMIN 4.1 06/13/2019   CALCIUM 9.9 06/13/2019   GFRAA 30 (L) 06/24/2017   12/17/2018 labs from [facility] showed protein creatinine ratio 585, CMP GFR 36, magnesium 2.1, hemoglobin 13.3 Speciality Comments: No specialty comments available.  Procedures:  No procedures performed Allergies: Doxazosin mesylate and Methocarbamol   Assessment / Plan:     Visit Diagnoses: Seropositive rheumatoid arthritis ([name])-his rheumatoid arthritis in  remission.  He had noticed joint swelling no synovitis.  ILD (interstitial lung disease) ([name])-he has history of ILD.  He has not seen Dr. Richardean Chimera in a long time.  Have advised him to schedule an appointment with Dr. Chase Caller.  He continues to have some shortness of breath.  Although the denies any worsening of his symptoms.  High risk medication use-he has taken CellCept and Imuran in the past but discontinued due to side effects.  Paresthesia of both hands-symptoms have improved since he has been using carpal tunnel braces.  History of right knee joint replacement-doing well.  Primary osteoarthritis of left knee-he currently does not have much discomfort.  Spondylosis without myelopathy or radiculopathy, lumbar region-chronic pain which is manageable.  Other medical problems are listed as follows:  Chronic diastolic heart failure ([name])  CKD (chronic kidney disease) stage 4, GFR 15-29 ml/min ([name])  History of coronary artery disease  History of diabetes mellitus  History of hyperlipidemia  History of sleep apnea  History of hypertension  History of gastroesophageal reflux (GERD)  History of diverticulosis  Educated about COVID-19 virus infection-he is fully immunized against COVID-19.  Have advised him to get a booster.  Use of mask, social distancing and hand hygiene was discussed.  I also discussed use of monoclonal antibody infusion in case he develops COVID-19 infection.  Orders: No orders of the defined types were placed in this encounter.  No orders of the defined types were placed in this encounter.     Follow-Up Instructions: Return in about 6 months (around 07/21/2020) for Rheumatoid arthritis,ILD.   [name], MD  Note - This record has been created using Editor, commissioning.  Chart creation errors have been sought, but may not always  have been located. Such creation errors do not reflect on  the standard of medical care.

## 2020-01-21 ENCOUNTER — Encounter: Payer: Self-pay | Admitting: Rheumatology

## 2020-01-21 ENCOUNTER — Ambulatory Visit (INDEPENDENT_AMBULATORY_CARE_PROVIDER_SITE_OTHER): Payer: Medicare Other | Admitting: Rheumatology

## 2020-01-21 ENCOUNTER — Other Ambulatory Visit: Payer: Self-pay

## 2020-01-21 VITALS — BP 112/65 | HR 58 | Ht 72.0 in | Wt 202.0 lb

## 2020-01-21 DIAGNOSIS — R202 Paresthesia of skin: Secondary | ICD-10-CM

## 2020-01-21 DIAGNOSIS — J849 Interstitial pulmonary disease, unspecified: Secondary | ICD-10-CM | POA: Diagnosis not present

## 2020-01-21 DIAGNOSIS — Z96651 Presence of right artificial knee joint: Secondary | ICD-10-CM | POA: Diagnosis not present

## 2020-01-21 DIAGNOSIS — Z7189 Other specified counseling: Secondary | ICD-10-CM

## 2020-01-21 DIAGNOSIS — Z8719 Personal history of other diseases of the digestive system: Secondary | ICD-10-CM

## 2020-01-21 DIAGNOSIS — N184 Chronic kidney disease, stage 4 (severe): Secondary | ICD-10-CM

## 2020-01-21 DIAGNOSIS — M47816 Spondylosis without myelopathy or radiculopathy, lumbar region: Secondary | ICD-10-CM

## 2020-01-21 DIAGNOSIS — I5032 Chronic diastolic (congestive) heart failure: Secondary | ICD-10-CM

## 2020-01-21 DIAGNOSIS — Z79899 Other long term (current) drug therapy: Secondary | ICD-10-CM

## 2020-01-21 DIAGNOSIS — M059 Rheumatoid arthritis with rheumatoid factor, unspecified: Secondary | ICD-10-CM | POA: Diagnosis not present

## 2020-01-21 DIAGNOSIS — M1712 Unilateral primary osteoarthritis, left knee: Secondary | ICD-10-CM

## 2020-01-21 DIAGNOSIS — Z8669 Personal history of other diseases of the nervous system and sense organs: Secondary | ICD-10-CM

## 2020-01-21 DIAGNOSIS — Z8639 Personal history of other endocrine, nutritional and metabolic disease: Secondary | ICD-10-CM

## 2020-01-21 DIAGNOSIS — Z8679 Personal history of other diseases of the circulatory system: Secondary | ICD-10-CM

## 2020-01-21 NOTE — Patient Instructions (Signed)
COVID-19 vaccine recommendations:   COVID-19 vaccine is recommended for everyone (unless you are allergic to a vaccine component), even if you are on a medication that suppresses your immune system.   Do not take Tylenol or any anti-inflammatory medications (NSAIDs) 24 hours prior to the COVID-19 vaccination.   There is no direct evidence about the efficacy of the COVID-19 vaccine in individuals who are on medications that suppress the immune system.   Even if you are fully vaccinated, and you are on any medications that suppress your immune system, please continue to wear a mask, maintain at least six feet social distance and practice hand hygiene.   If you develop a COVID-19 infection, please contact your PCP or our office to determine if you need antibody infusion.  The booster vaccine is now available for immunocompromised patients. It is advised that if you had Pfizer vaccine you should get Pfizer booster.  If you had a Moderna vaccine then you should get a Moderna booster. Johnson and Johnson does not have a booster vaccine at this time.  Please see the following web sites for updated information.   [email or web address]    

## 2020-01-22 ENCOUNTER — Other Ambulatory Visit: Payer: Self-pay

## 2020-01-22 ENCOUNTER — Encounter: Payer: Self-pay | Admitting: Nurse Practitioner

## 2020-01-22 ENCOUNTER — Ambulatory Visit (INDEPENDENT_AMBULATORY_CARE_PROVIDER_SITE_OTHER): Payer: Medicare Other | Admitting: Nurse Practitioner

## 2020-01-22 VITALS — BP 132/78 | HR 66 | Ht 72.0 in | Wt 200.0 lb

## 2020-01-22 DIAGNOSIS — I10 Essential (primary) hypertension: Secondary | ICD-10-CM | POA: Diagnosis not present

## 2020-01-22 DIAGNOSIS — I2510 Atherosclerotic heart disease of native coronary artery without angina pectoris: Secondary | ICD-10-CM | POA: Diagnosis not present

## 2020-01-22 DIAGNOSIS — J849 Interstitial pulmonary disease, unspecified: Secondary | ICD-10-CM

## 2020-01-22 MED ORDER — CLOPIDOGREL BISULFATE 75 MG PO TABS: 75.0000 mg | ORAL_TABLET | Freq: Every day | ORAL | 3 refills | Status: DC

## 2020-01-22 NOTE — Patient Instructions (Addendum)
After Visit Summary:  We will be checking the following labs today - NONE   Medication Instructions:    Continue with your current medicines.   I refilled the Plavix today.    If you need a refill on your cardiac medications before your next appointment, please call your pharmacy.     Testing/Procedures To Be Arranged:  N/A  Follow-Up:   See Dr. Johnsie Cancel in 6 months - You will receive a reminder letter in the mail two months in advance. If you don't receive a letter, please call our office to schedule the follow-up appointment.     At [facility], you and your health needs are our priority.  As part of our continuing mission to provide you with exceptional heart care, we have created designated Provider Care Teams.  These Care Teams include your primary Cardiologist (physician) and Advanced Practice Providers (APPs -  Physician Assistants and Nurse Practitioners) who all work together to provide you with the care you need, when you need it.  Special Instructions:  . Stay safe, wash your hands for at least 20 seconds and wear a mask when needed.  . It was good to talk with you today.    Call the [place] office at [phone] if you have any questions, problems or concerns.

## 2020-01-25 ENCOUNTER — Ambulatory Visit: Payer: Medicare Other | Admitting: Rheumatology

## 2020-01-25 ENCOUNTER — Ambulatory Visit: Payer: Medicare Other | Admitting: Physician Assistant

## 2020-01-25 DIAGNOSIS — M47816 Spondylosis without myelopathy or radiculopathy, lumbar region: Secondary | ICD-10-CM

## 2020-01-25 DIAGNOSIS — M059 Rheumatoid arthritis with rheumatoid factor, unspecified: Secondary | ICD-10-CM

## 2020-01-25 DIAGNOSIS — Z96651 Presence of right artificial knee joint: Secondary | ICD-10-CM

## 2020-01-25 DIAGNOSIS — I5032 Chronic diastolic (congestive) heart failure: Secondary | ICD-10-CM

## 2020-01-25 DIAGNOSIS — Z8669 Personal history of other diseases of the nervous system and sense organs: Secondary | ICD-10-CM

## 2020-01-25 DIAGNOSIS — J849 Interstitial pulmonary disease, unspecified: Secondary | ICD-10-CM

## 2020-01-25 DIAGNOSIS — N184 Chronic kidney disease, stage 4 (severe): Secondary | ICD-10-CM

## 2020-01-25 DIAGNOSIS — M1712 Unilateral primary osteoarthritis, left knee: Secondary | ICD-10-CM

## 2020-01-25 DIAGNOSIS — R202 Paresthesia of skin: Secondary | ICD-10-CM

## 2020-01-25 DIAGNOSIS — Z8679 Personal history of other diseases of the circulatory system: Secondary | ICD-10-CM

## 2020-01-25 DIAGNOSIS — Z8719 Personal history of other diseases of the digestive system: Secondary | ICD-10-CM

## 2020-01-25 DIAGNOSIS — Z8639 Personal history of other endocrine, nutritional and metabolic disease: Secondary | ICD-10-CM

## 2020-01-25 DIAGNOSIS — Z79899 Other long term (current) drug therapy: Secondary | ICD-10-CM

## 2020-01-29 ENCOUNTER — Telehealth: Payer: Self-pay | Admitting: Internal Medicine

## 2020-01-29 MED ORDER — GLUCOSE BLOOD VI STRP: ORAL_STRIP | 3 refills | Status: DC

## 2020-01-29 MED ORDER — ONETOUCH DELICA LANCETS 33G MISC: 1.0000 | Freq: Every day | 3 refills | Status: DC

## 2020-01-29 NOTE — Telephone Encounter (Signed)
Rx sent electronically.  

## 2020-01-29 NOTE — Telephone Encounter (Signed)
Pt called [facility] pharmacy To get refill one touch lancets and test strips They need rx

## 2020-01-31 ENCOUNTER — Encounter: Payer: Self-pay | Admitting: Podiatry

## 2020-01-31 ENCOUNTER — Ambulatory Visit: Payer: Medicare Other | Admitting: Podiatry

## 2020-01-31 ENCOUNTER — Other Ambulatory Visit: Payer: Self-pay

## 2020-01-31 DIAGNOSIS — N184 Chronic kidney disease, stage 4 (severe): Secondary | ICD-10-CM

## 2020-01-31 DIAGNOSIS — E1142 Type 2 diabetes mellitus with diabetic polyneuropathy: Secondary | ICD-10-CM | POA: Diagnosis not present

## 2020-01-31 DIAGNOSIS — L84 Corns and callosities: Secondary | ICD-10-CM | POA: Diagnosis not present

## 2020-01-31 DIAGNOSIS — E1122 Type 2 diabetes mellitus with diabetic chronic kidney disease: Secondary | ICD-10-CM | POA: Diagnosis not present

## 2020-01-31 DIAGNOSIS — N183 Chronic kidney disease, stage 3 unspecified: Secondary | ICD-10-CM

## 2020-01-31 DIAGNOSIS — M216X2 Other acquired deformities of left foot: Secondary | ICD-10-CM

## 2020-01-31 NOTE — Progress Notes (Signed)
This patient returns to my office for at risk foot care.  This patient requires this care by a professional since this patient will be at risk due to having neuropathy, CKD and diabetes.  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   He presents to the office with his wife.  This patient has a painful callus on the outside ball of his left foot.  No self treatment or professional care was noted.  This patient presents for at risk foot care today.  General Appearance  Alert, conversant and in no acute stress.  Vascular  Dorsalis pedis and posterior tibial  pulses are palpable  bilaterally.  Capillary return is within normal limits  bilaterally. Temperature is within normal limits  bilaterally.  Neurologic  Senn-Weinstein monofilament wire test absent  bilaterally. Muscle power within normal limits bilaterally.  Nails Thick disfigured discolored nails with subungual debris  from hallux to fifth toes bilaterally. No evidence of bacterial infection or drainage bilaterally.  Orthopedic  No limitations of motion  feet .  No crepitus or effusions noted.  No bony pathology or digital deformities noted. Plantar flexed fifth metatarsal left foot.  Skin  normotropic skin with no porokeratosis noted bilaterally.  No signs of infections or ulcers noted.   Callus sub 5th metatarsal left foot.  Callus due to plantarflexed fifth metatarsal left foot.  Consent was obtained for treatment procedures.   Debridement of callus with # 15 blade.   Return office visit    10 weeks                Told patient to return for periodic foot care and evaluation due to potential at risk complications.   Gardiner Barefoot DPM

## 2020-02-05 ENCOUNTER — Ambulatory Visit: Payer: Medicare Other | Admitting: Internal Medicine

## 2020-02-05 ENCOUNTER — Encounter: Payer: Self-pay | Admitting: Internal Medicine

## 2020-02-05 VITALS — BP 140/80 | HR 62 | Ht 72.0 in | Wt 198.0 lb

## 2020-02-05 DIAGNOSIS — K224 Dyskinesia of esophagus: Secondary | ICD-10-CM | POA: Diagnosis not present

## 2020-02-05 DIAGNOSIS — K220 Achalasia of cardia: Secondary | ICD-10-CM

## 2020-02-05 DIAGNOSIS — Z7902 Long term (current) use of antithrombotics/antiplatelets: Secondary | ICD-10-CM

## 2020-02-05 DIAGNOSIS — K222 Esophageal obstruction: Secondary | ICD-10-CM

## 2020-02-05 DIAGNOSIS — R1310 Dysphagia, unspecified: Secondary | ICD-10-CM

## 2020-02-05 DIAGNOSIS — L720 Epidermal cyst: Secondary | ICD-10-CM | POA: Diagnosis not present

## 2020-02-05 NOTE — Patient Instructions (Signed)
You have been scheduled for an endoscopy. Please follow written instructions given to you at your visit today. If you use inhalers (even only as needed), please bring them with you on the day of your procedure.   

## 2020-02-05 NOTE — Progress Notes (Signed)
HISTORY OF PRESENT ILLNESS:  [name] is a 86 y.o. male with multiple significant medical problems as listed below including coronary artery disease for which she is on Plavix and had interstitial lung disease.  He has a history of chronic functional constipation as well as problems with dysphagia secondary to esophageal stricture and dysmotility for which she is undergone prior esophageal dilation and Botox injection therapy at [facility] December 2019.  He also has a history of iron deficiency anemia secondary to small bowel AVMs (also worked up at [facility]).  He was last seen in this office Aug 14, 2018 regarding recurrent dysphagia.  See that dictation for details.  He subsequently underwent upper endoscopy with Botox injection therapy/balloon dilation of the esophagus to 20 mm off Plavix November 27, 2018.  He states that he did well for a few months thereafter but has had recurrent problems with dysphagia.  This has worsened over time.  He needs to eat slowly.  Occasionally regurgitates food during the day, but not at night.  He is accompanied by his wife.  He has completed his Covid vaccination series.  Review of blood work from March 2021 shows hemoglobin of 14.1  REVIEW OF SYSTEMS:  All non-GI ROS negative unless otherwise stated in the HPI except for shortness of breath, visual change  Past Medical History:  Diagnosis Date  . Arthritis    osteoarthritis, s/p R TKR, and digits  . CAD (coronary artery disease)    a. s/p CABG (2001)  b. s/p DES to RCA and cutting POBA to ostial PDA (2013)   c. s/p DES to SVG to OM2 (01/14/14) d. cath: 08/2015 NSTEMI w/ patent LIMA-LAD and 99% stenosis of SVG-OM w/ DES placed. CTO of SVG-RCA and SVG-D1.   . Cataract   . Chronic diastolic CHF (congestive heart failure) ([place])    a) 09/13 ECHO- LVEF 44-03%, grade 1 diastolic dysfunction, mild LA dilatation, atrial septal aneurysm, AV mobility restricted, but no sig AS by doppler; b) 09/04/08 ECHO- LVH, ef 60%, mild AS, c.  echo 08/2015: EF perserved of 55-60% with inferolateral HK. Mild AS noted.  . Chronic kidney disease, stage III (moderate) ([place])   . Chronic lower back pain   . Colon polyps   . COVID-19   . Diverticulosis   . Dyspnea 2009 since July -Sept   05/06/08-CPST-  normal effort, reduced VO2 max 20.5 /65%, reduced at 8.2/ 40%, normal breathing resetvca of 55%, submaximal heart rate response 112/77%, flattened o2 pluse response at peak exercise-12 ml/beat @ 85%, No VQ mismatch abnormalities, All c/w CIRC Limitation  . Enlarged prostate   . Esophageal stricture    a. s/p dilation spring 2010  . GERD (gastroesophageal reflux disease)   . Heart murmur   . Hiatal hernia   . History of carpal tunnel syndrome    Bilateral  . History of kidney stones   . History of PFTs    mixed pattern on spiro. mild restn on lung volumes with near normal DLCO. Pattern can be explained by CABG scar. Fev1 2.2L/73%, ratio 68 (67), TLC 4.7/68%,RV 1.5L/55%,DLCO 79%  . Hyperlipidemia   . Hypertension   . Interstitial lung disease ([name])    NOS  . Iron deficiency anemia   . Nausea & vomiting    2018/2019  . On home oxygen therapy    2 L [place] at bedtime  . Osteoporosis   . Overweight (BMI 25.0-29.9)    BMI 29  . Peripheral neuropathy   .  RA (rheumatoid arthritis) ([name])    [name]  . Seropositive rheumatoid arthritis ([place])   . Type II diabetes mellitus ([name])    diet controlled  . Wears glasses   . Wears partial dentures    upper    Past Surgical History:  Procedure Laterality Date  . BALLOON DILATION N/A 09/12/2018   Procedure: BALLOON DILATION;  Surgeon: Irene Shipper, MD;  Location[facility];  Service: Endoscopy;  Laterality: N/A;  . BOTOX INJECTION N/A 09/12/2018   Procedure: BOTOX INJECTION;  Surgeon: Irene Shipper, MD;  [facility]: [facility];  Service: Endoscopy;  Laterality: N/A;  . BOTOX INJECTION N/A 11/27/2018   Procedure: BOTOX INJECTION;  Surgeon: Irene Shipper, MD;  [facility];   Service: Endoscopy;  Laterality: N/A;  . CARDIAC CATHETERIZATION  08/2004   CP- no MI, Cath- small vessell disease   . CARDIAC CATHETERIZATION  12/31/2011   80% distal LM, 100% native LAD, LCx and RCA, 30% prox SVG-OM, SVG-D1 normal, 99% distal, 80% ostial SVG-RCA distal to graft, LIMA-LAD normal; LVEF mildly decreased with posterior basal AK   . CARDIAC CATHETERIZATION  2009   with patent grafts/notes 12/31/2011  . CARDIAC CATHETERIZATION N/A 08/13/2015   Procedure: Left Heart Cath and Cors/Grafts Angiography;  Surgeon: Sherren Mocha, MD;  Location: [place] [facility];  Service: Cardiovascular;  Laterality: N/A;  . CATARACT EXTRACTION W/ INTRAOCULAR LENS  IMPLANT, BILATERAL Bilateral   . CHOLECYSTECTOMY OPEN  11/2003   [name]  . CORONARY ANGIOPLASTY WITH STENT PLACEMENT  01/03/2012   Successful DES to SVG-RCA and cutting balloon angioplasty ostial  PDA   . CORONARY ANGIOPLASTY WITH STENT PLACEMENT  01/14/2014   "1"  . CORONARY ARTERY BYPASS GRAFT  11/1999   CABG X5  . CORONARY STENT PLACEMENT  02/2012   1 stent and balloon  . ESOPHAGEAL DILATION  11/27/2018   Procedure: ESOPHAGEAL DILATION;  Surgeon: Irene Shipper, MD;  [facility];  Service: Endoscopy;;  . ESOPHAGOGASTRODUODENOSCOPY N/A 03/01/2017   Procedure: ESOPHAGOGASTRODUODENOSCOPY (EGD);  Surgeon: Irene Shipper, MD;  Location: [facility];  Service: Endoscopy;  Laterality: N/A;  . ESOPHAGOGASTRODUODENOSCOPY (EGD) WITH ESOPHAGEAL DILATION  2010  . ESOPHAGOGASTRODUODENOSCOPY (EGD) WITH PROPOFOL N/A 09/12/2018   Procedure: ESOPHAGOGASTRODUODENOSCOPY (EGD) WITH PROPOFOL;  Surgeon: Irene Shipper, MD;  [facility];  Service: Endoscopy;  Laterality: N/A;  . ESOPHAGOGASTRODUODENOSCOPY (EGD) WITH PROPOFOL N/A 11/27/2018   Procedure: ESOPHAGOGASTRODUODENOSCOPY (EGD) WITH PROPOFOL, WITH BALLOON DILATION;  Surgeon: Irene Shipper, MD;  [facility];  Service: Endoscopy;  Laterality: N/A;  . HAND SURGERY     bilateral  carpal tunnel releases  . JOINT REPLACEMENT    . KNEE ARTHROSCOPY Right 2008  . LEFT AND RIGHT HEART CATHETERIZATION WITH CORONARY ANGIOGRAM  12/31/2011   Procedure: LEFT AND RIGHT HEART CATHETERIZATION WITH CORONARY ANGIOGRAM;  Surgeon: Burnell Blanks, MD;  Location: [facility] CATH LAB;  Service: Cardiovascular;;  . LEFT AND RIGHT HEART CATHETERIZATION WITH CORONARY ANGIOGRAM N/A 01/14/2014   Procedure: LEFT AND RIGHT HEART CATHETERIZATION WITH CORONARY ANGIOGRAM;  Surgeon: Peter M Martinique, MD;  Location: [facility] CATH LAB;  Service: Cardiovascular;  Laterality: N/A;  . MALONEY DILATION  03/01/2017   Procedure: Venia Minks DILATION;  Surgeon: Irene Shipper, MD;  Location: [facility];  Service: Endoscopy;;  . PERCUTANEOUS CORONARY INTERVENTION-BALLOON ONLY  01/03/2012   Procedure: PERCUTANEOUS CORONARY INTERVENTION-BALLOON ONLY;  Surgeon: Peter M Martinique, MD;  Location: [facility] CATH LAB;  Service: Cardiovascular;;  . PERCUTANEOUS CORONARY STENT INTERVENTION (  PCI-S)  12/31/2011   Procedure: PERCUTANEOUS CORONARY STENT INTERVENTION (PCI-S);  Surgeon: Burnell Blanks, MD;  Location: [facility] CATH LAB;  Service: Cardiovascular;;  . PERCUTANEOUS CORONARY STENT INTERVENTION (PCI-S) N/A 01/03/2012   Procedure: PERCUTANEOUS CORONARY STENT INTERVENTION (PCI-S);  Surgeon: Peter M Martinique, MD;  Location: [facility] CATH LAB;  Service: Cardiovascular;  Laterality: N/A;  . SHOULDER ARTHROSCOPY WITH OPEN ROTATOR CUFF REPAIR AND DISTAL CLAVICLE ACROMINECTOMY Left 02/27/2013   Procedure: LEFT SHOULDER ARTHROSCOPY WITH MINI OPEN ROTATOR CUFF REPAIR AND SUBACROMIAL DECOMPRESSION AND DISTAL CLAVICLE RESECTION;  Surgeon: [name], MD;  Location: [place];  Service: Orthopedics;  Laterality: Left;  . TOTAL KNEE ARTHROPLASTY Right 03/2010   [name]  . TRIGGER FINGER RELEASE Left 02/27/2013   Procedure: RELEASE TRIGGER FINGER/A-1 PULLEY;  Surgeon: [name], MD;  Location: [place];  Service: Orthopedics;  Laterality: Left;     Social History ANTHONE PRIEUR  reports that he quit smoking about 56 years ago. His smoking use included cigarettes. He has a 20.00 pack-year smoking history. He has quit using smokeless tobacco. He reports that he does not drink alcohol and does not use drugs.  family history includes Alcohol abuse in his sister; COPD in his mother; Colon cancer (age of onset: 38) in his brother; Diabetes in his brother; Heart attack in his father; Heart disease in his father; Stomach cancer in his brother; Stroke in his sister.  Allergies  Allergen Reactions  . Doxazosin Mesylate Other (See Comments)    dizziness  . Methocarbamol Rash       PHYSICAL EXAMINATION: Vital signs: BP 140/80   Pulse 62   Ht 6' (1.829 m)   Wt 198 lb (89.8 kg)   SpO2 94%   BMI 26.85 kg/m   Constitutional: generally well-appearing, no acute distress Psychiatric: alert and oriented x3, cooperative Eyes: extraocular movements intact, anicteric, conjunctiva pink Mouth: oral pharynx moist, no lesions Neck: supple no lymphadenopathy Cardiovascular: heart regular rate and rhythm, no murmur Lungs: clear to auscultation bilaterally Abdomen: soft, nontender, nondistended, no obvious ascites, no peritoneal signs, normal bowel sounds, no organomegaly Rectal: Omitted Extremities: no clubbing, cyanosis, or lower extremity edema bilaterally Skin: no lesions on visible extremities Neuro: No focal deficits. No asterixis.    ASSESSMENT:  1.  Dysphagia.  Known esophageal stricture and dysmotility.  Achalasia type picture previously responding to Botox therapy.  Now with recurrent symptoms. 2.  Multiple significant medical problems.  On Plavix.  Interstitial lung disease. 3.  History of iron deficiency anemia secondary to small bowel AVMs.  Last hemoglobin normal as noted above   PLAN:  1.  Schedule upper endoscopy with Botox injection/esophageal dilation at the hospital with monitored anesthesia care.  The patient is HIGH  RISK due to his age, comorbidities, and the need to interrupt Plavix therapy for his therapeutic procedure.The nature of the procedure, as well as the risks, benefits, and alternatives were carefully and thoroughly reviewed with the patient. Ample time for discussion and questions allowed. The patient understood, was satisfied, and agreed to proceed. 2.  Hold Plavix 5 to 7 days prior to the procedure.  Dissipate resumption of treatment immediately post procedure.  He will continue his daily aspirin throughout.

## 2020-02-05 NOTE — H&P (View-Only) (Signed)
HISTORY OF PRESENT ILLNESS:  [name] is a 86 y.o. male with multiple significant medical problems as listed below including coronary artery disease for which she is on Plavix and had interstitial lung disease.  He has a history of chronic functional constipation as well as problems with dysphagia secondary to esophageal stricture and dysmotility for which she is undergone prior esophageal dilation and Botox injection therapy at [facility] December 2019.  He also has a history of iron deficiency anemia secondary to small bowel AVMs (also worked up at [facility]).  He was last seen in this office Aug 14, 2018 regarding recurrent dysphagia.  See that dictation for details.  He subsequently underwent upper endoscopy with Botox injection therapy/balloon dilation of the esophagus to 20 mm off Plavix November 27, 2018.  He states that he did well for a few months thereafter but has had recurrent problems with dysphagia.  This has worsened over time.  He needs to eat slowly.  Occasionally regurgitates food during the day, but not at night.  He is accompanied by his wife.  He has completed his Covid vaccination series.  Review of blood work from March 2021 shows hemoglobin of 14.1  REVIEW OF SYSTEMS:  All non-GI ROS negative unless otherwise stated in the HPI except for shortness of breath, visual change  Past Medical History:  Diagnosis Date  . Arthritis    osteoarthritis, s/p R TKR, and digits  . CAD (coronary artery disease)    a. s/p CABG (2001)  b. s/p DES to RCA and cutting POBA to ostial PDA (2013)   c. s/p DES to SVG to OM2 (01/14/14) d. cath: 08/2015 NSTEMI w/ patent LIMA-LAD and 99% stenosis of SVG-OM w/ DES placed. CTO of SVG-RCA and SVG-D1.   . Cataract   . Chronic diastolic CHF (congestive heart failure) ([place])    a) 09/13 ECHO- LVEF 49-82%, grade 1 diastolic dysfunction, mild LA dilatation, atrial septal aneurysm, AV mobility restricted, but no sig AS by doppler; b) 09/04/08 ECHO- LVH, ef 60%, mild AS, c.  echo 08/2015: EF perserved of 55-60% with inferolateral HK. Mild AS noted.  . Chronic kidney disease, stage III (moderate) ([place])   . Chronic lower back pain   . Colon polyps   . COVID-19   . Diverticulosis   . Dyspnea 2009 since July -Sept   05/06/08-CPST-  normal effort, reduced VO2 max 20.5 /65%, reduced at 8.2/ 40%, normal breathing resetvca of 55%, submaximal heart rate response 112/77%, flattened o2 pluse response at peak exercise-12 ml/beat @ 85%, No VQ mismatch abnormalities, All c/w CIRC Limitation  . Enlarged prostate   . Esophageal stricture    a. s/p dilation spring 2010  . GERD (gastroesophageal reflux disease)   . Heart murmur   . Hiatal hernia   . History of carpal tunnel syndrome    Bilateral  . History of kidney stones   . History of PFTs    mixed pattern on spiro. mild restn on lung volumes with near normal DLCO. Pattern can be explained by CABG scar. Fev1 2.2L/73%, ratio 68 (67), TLC 4.7/68%,RV 1.5L/55%,DLCO 79%  . Hyperlipidemia   . Hypertension   . Interstitial lung disease ([name])    NOS  . Iron deficiency anemia   . Nausea & vomiting    2018/2019  . On home oxygen therapy    2 L [employer] at bedtime  . Osteoporosis   . Overweight (BMI 25.0-29.9)    BMI 29  . Peripheral neuropathy   .  RA (rheumatoid arthritis) ([name])    [name]  . Seropositive rheumatoid arthritis ([place])   . Type II diabetes mellitus ([name])    diet controlled  . Wears glasses   . Wears partial dentures    upper    Past Surgical History:  Procedure Laterality Date  . BALLOON DILATION N/A 09/12/2018   Procedure: BALLOON DILATION;  Surgeon: Irene Shipper, MD;  Location: [facility];  Service: Endoscopy;  Laterality: N/A;  . BOTOX INJECTION N/A 09/12/2018   Procedure: BOTOX INJECTION;  Surgeon: Irene Shipper, MD;  [facility];  Service: Endoscopy;  Laterality: N/A;  . BOTOX INJECTION N/A 11/27/2018   Procedure: BOTOX INJECTION;  Surgeon: Irene Shipper, MD;  [facility];   Service: Endoscopy;  Laterality: N/A;  . CARDIAC CATHETERIZATION  08/2004   CP- no MI, Cath- small vessell disease   . CARDIAC CATHETERIZATION  12/31/2011   80% distal LM, 100% native LAD, LCx and RCA, 30% prox SVG-OM, SVG-D1 normal, 99% distal, 80% ostial SVG-RCA distal to graft, LIMA-LAD normal; LVEF mildly decreased with posterior basal AK   . CARDIAC CATHETERIZATION  2009   with patent grafts/notes 12/31/2011  . CARDIAC CATHETERIZATION N/A 08/13/2015   Procedure: Left Heart Cath and Cors/Grafts Angiography;  Surgeon: Sherren Mocha, MD;  Location: [place] CV LAB;  Service: Cardiovascular;  Laterality: N/A;  . CATARACT EXTRACTION W/ INTRAOCULAR LENS  IMPLANT, BILATERAL Bilateral   . CHOLECYSTECTOMY OPEN  11/2003   [name]  . CORONARY ANGIOPLASTY WITH STENT PLACEMENT  01/03/2012   Successful DES to SVG-RCA and cutting balloon angioplasty ostial  PDA   . CORONARY ANGIOPLASTY WITH STENT PLACEMENT  01/14/2014   "1"  . CORONARY ARTERY BYPASS GRAFT  11/1999   CABG X5  . CORONARY STENT PLACEMENT  02/2012   1 stent and balloon  . ESOPHAGEAL DILATION  11/27/2018   Procedure: ESOPHAGEAL DILATION;  Surgeon: Irene Shipper, MD;  [facility];  Service: Endoscopy;;  . ESOPHAGOGASTRODUODENOSCOPY N/A 03/01/2017   Procedure: ESOPHAGOGASTRODUODENOSCOPY (EGD);  Surgeon: Irene Shipper, MD;  Location: [facility];  Service: Endoscopy;  Laterality: N/A;  . ESOPHAGOGASTRODUODENOSCOPY (EGD) WITH ESOPHAGEAL DILATION  2010  . ESOPHAGOGASTRODUODENOSCOPY (EGD) WITH PROPOFOL N/A 09/12/2018   Procedure: ESOPHAGOGASTRODUODENOSCOPY (EGD) WITH PROPOFOL;  Surgeon: Irene Shipper, MD;  [facility];  Service: Endoscopy;  Laterality: N/A;  . ESOPHAGOGASTRODUODENOSCOPY (EGD) WITH PROPOFOL N/A 11/27/2018   Procedure: ESOPHAGOGASTRODUODENOSCOPY (EGD) WITH PROPOFOL, WITH BALLOON DILATION;  Surgeon: Irene Shipper, MD;  [facility];  Service: Endoscopy;  Laterality: N/A;  . HAND SURGERY     bilateral  carpal tunnel releases  . JOINT REPLACEMENT    . KNEE ARTHROSCOPY Right 2008  . LEFT AND RIGHT HEART CATHETERIZATION WITH CORONARY ANGIOGRAM  12/31/2011   Procedure: LEFT AND RIGHT HEART CATHETERIZATION WITH CORONARY ANGIOGRAM;  Surgeon: Burnell Blanks, MD;  Location: [facility] CATH LAB;  Service: Cardiovascular;;  . LEFT AND RIGHT HEART CATHETERIZATION WITH CORONARY ANGIOGRAM N/A 01/14/2014   Procedure: LEFT AND RIGHT HEART CATHETERIZATION WITH CORONARY ANGIOGRAM;  Surgeon: Peter M Martinique, MD;  Location: [facility] CATH LAB;  Service: Cardiovascular;  Laterality: N/A;  . MALONEY DILATION  03/01/2017   Procedure: Venia Minks DILATION;  Surgeon: Irene Shipper, MD;  Location: [facility];  Service: Endoscopy;;  . PERCUTANEOUS CORONARY INTERVENTION-BALLOON ONLY  01/03/2012   Procedure: PERCUTANEOUS CORONARY INTERVENTION-BALLOON ONLY;  Surgeon: Peter M Martinique, MD;  Location: [facility] CATH LAB;  Service: Cardiovascular;;  . PERCUTANEOUS CORONARY STENT INTERVENTION (  PCI-S)  12/31/2011   Procedure: PERCUTANEOUS CORONARY STENT INTERVENTION (PCI-S);  Surgeon: Burnell Blanks, MD;  Location: [facility] CATH LAB;  Service: Cardiovascular;;  . PERCUTANEOUS CORONARY STENT INTERVENTION (PCI-S) N/A 01/03/2012   Procedure: PERCUTANEOUS CORONARY STENT INTERVENTION (PCI-S);  Surgeon: Peter M Martinique, MD;  Location: [facility] CATH LAB;  Service: Cardiovascular;  Laterality: N/A;  . SHOULDER ARTHROSCOPY WITH OPEN ROTATOR CUFF REPAIR AND DISTAL CLAVICLE ACROMINECTOMY Left 02/27/2013   Procedure: LEFT SHOULDER ARTHROSCOPY WITH MINI OPEN ROTATOR CUFF REPAIR AND SUBACROMIAL DECOMPRESSION AND DISTAL CLAVICLE RESECTION;  Surgeon: [name], MD;  Location: [place];  Service: Orthopedics;  Laterality: Left;  . TOTAL KNEE ARTHROPLASTY Right 03/2010   [name]  . TRIGGER FINGER RELEASE Left 02/27/2013   Procedure: RELEASE TRIGGER FINGER/A-1 PULLEY;  Surgeon: [name], MD;  Location: [place];  Service: Orthopedics;  Laterality: Left;     Social History [name]  reports that he quit smoking about 56 years ago. His smoking use included cigarettes. He has a 20.00 pack-year smoking history. He has quit using smokeless tobacco. He reports that he does not drink alcohol and does not use drugs.  family history includes Alcohol abuse in his sister; COPD in his mother; Colon cancer (age of onset: 75) in his brother; Diabetes in his brother; Heart attack in his father; Heart disease in his father; Stomach cancer in his brother; Stroke in his sister.  Allergies  Allergen Reactions  . Doxazosin Mesylate Other (See Comments)    dizziness  . Methocarbamol Rash       PHYSICAL EXAMINATION: Vital signs: BP 140/80   Pulse 62   Ht 6' (1.829 m)   Wt 198 lb (89.8 kg)   SpO2 94%   BMI 26.85 kg/m   Constitutional: generally well-appearing, no acute distress Psychiatric: alert and oriented x3, cooperative Eyes: extraocular movements intact, anicteric, conjunctiva pink Mouth: oral pharynx moist, no lesions Neck: supple no lymphadenopathy Cardiovascular: heart regular rate and rhythm, no murmur Lungs: clear to auscultation bilaterally Abdomen: soft, nontender, nondistended, no obvious ascites, no peritoneal signs, normal bowel sounds, no organomegaly Rectal: Omitted Extremities: no clubbing, cyanosis, or lower extremity edema bilaterally Skin: no lesions on visible extremities Neuro: No focal deficits. No asterixis.    ASSESSMENT:  1.  Dysphagia.  Known esophageal stricture and dysmotility.  Achalasia type picture previously responding to Botox therapy.  Now with recurrent symptoms. 2.  Multiple significant medical problems.  On Plavix.  Interstitial lung disease. 3.  History of iron deficiency anemia secondary to small bowel AVMs.  Last hemoglobin normal as noted above   PLAN:  1.  Schedule upper endoscopy with Botox injection/esophageal dilation at the hospital with monitored anesthesia care.  The patient is HIGH  RISK due to his age, comorbidities, and the need to interrupt Plavix therapy for his therapeutic procedure.The nature of the procedure, as well as the risks, benefits, and alternatives were carefully and thoroughly reviewed with the patient. Ample time for discussion and questions allowed. The patient understood, was satisfied, and agreed to proceed. 2.  Hold Plavix 5 to 7 days prior to the procedure.  Dissipate resumption of treatment immediately post procedure.  He will continue his daily aspirin throughout.

## 2020-02-06 ENCOUNTER — Telehealth: Payer: Self-pay | Admitting: Internal Medicine

## 2020-02-06 NOTE — Telephone Encounter (Signed)
I believe this one we told him to hold his Plavix for a week but It wasn't necessary for me to ge permission.  Do you want to contact Dr. Johnsie Cancel or would you like me to?

## 2020-02-07 NOTE — Telephone Encounter (Signed)
Spoke with patient and clarified that per Dr. Johnsie Cancel he could hold his Plavix for 5 days prior to his procedure.  Patient agreed.

## 2020-02-07 NOTE — Telephone Encounter (Signed)
His last intervention / stent was over 4 years ago  [place] to hold antiplatelet agents 5 days before

## 2020-02-07 NOTE — Telephone Encounter (Signed)
Great.  Thanks

## 2020-02-07 NOTE — Telephone Encounter (Signed)
Magda Paganini, sure.  Please note that this issue was formally addressed by cardiology, at our request, May 2020.  Permission to hold Plavix 5 days prior to the procedure was given.  You may reach out again.  Please note that this procedure is therapeutic including Botox injection and balloon dilation therapy.  Higher risk of bleeding in general.  I will CC Dr. Johnsie Cancel to make sure that there were not any interval clinical issues, since last year, that would alter the previous recommendation and current plans.  Thanks

## 2020-02-12 ENCOUNTER — Other Ambulatory Visit: Payer: Self-pay

## 2020-02-12 ENCOUNTER — Encounter: Payer: Self-pay | Admitting: Student in an Organized Health Care Education/Training Program

## 2020-02-12 ENCOUNTER — Ambulatory Visit
Payer: Medicare Other | Attending: Student in an Organized Health Care Education/Training Program | Admitting: Student in an Organized Health Care Education/Training Program

## 2020-02-12 VITALS — BP 118/76 | HR 66 | Temp 97.6°F | Resp 18 | Ht 72.0 in | Wt 200.0 lb

## 2020-02-12 DIAGNOSIS — M47816 Spondylosis without myelopathy or radiculopathy, lumbar region: Secondary | ICD-10-CM | POA: Insufficient documentation

## 2020-02-12 DIAGNOSIS — E1142 Type 2 diabetes mellitus with diabetic polyneuropathy: Secondary | ICD-10-CM | POA: Diagnosis not present

## 2020-02-12 DIAGNOSIS — G894 Chronic pain syndrome: Secondary | ICD-10-CM | POA: Diagnosis not present

## 2020-02-12 DIAGNOSIS — Z79891 Long term (current) use of opiate analgesic: Secondary | ICD-10-CM | POA: Diagnosis not present

## 2020-02-12 DIAGNOSIS — M792 Neuralgia and neuritis, unspecified: Secondary | ICD-10-CM | POA: Diagnosis not present

## 2020-02-12 MED ORDER — PREGABALIN 25 MG PO CAPS
ORAL_CAPSULE | ORAL | 0 refills | Status: DC
Start: 2020-02-12 — End: 2020-06-11

## 2020-02-12 MED ORDER — HYDROCODONE-ACETAMINOPHEN 10-325 MG PO TABS: 2.0000 | ORAL_TABLET | Freq: Three times a day (TID) | ORAL | 0 refills | Status: AC | PRN

## 2020-02-12 MED ORDER — HYDROCODONE-ACETAMINOPHEN 10-325 MG PO TABS: 2.0000 | ORAL_TABLET | Freq: Three times a day (TID) | ORAL | 0 refills | Status: DC | PRN

## 2020-02-12 MED ORDER — HYDROCODONE-ACETAMINOPHEN 10-325 MG PO TABS: 2.0000 | ORAL_TABLET | Freq: Two times a day (BID) | ORAL | 0 refills | Status: AC | PRN

## 2020-02-12 NOTE — Progress Notes (Signed)
Nursing Pain Medication Assessment:  Safety precautions to be maintained throughout the outpatient stay will include: orient to surroundings, keep bed in low position, maintain call bell within reach at all times, provide assistance with transfer out of bed and ambulation.  Medication Inspection Compliance: Pill count conducted under aseptic conditions, in front of the patient. Neither the pills nor the bottle was removed from the patient's sight at any time. Once count was completed pills were immediately returned to the patient in their original bottle.  Medication: Hydrocodone/APAP Pill/Patch Count: 19 of 150 pills remain Pill/Patch Appearance: Markings consistent with prescribed medication Bottle Appearance: Standard pharmacy container. Clearly labeled. Filled Date: 10/ 11 / 2021 Last Medication intake:  Today  xtampza 33/60 Filled 01-19-2020 Last took today

## 2020-02-12 NOTE — Progress Notes (Signed)
PROVIDER NOTE: Information contained herein reflects review and annotations entered in association with encounter. Interpretation of such information and data should be left to medically-trained personnel. Information provided to patient can be located elsewhere in the medical record under "Patient Instructions". Document created using STT-dictation technology, any transcriptional errors that may result from process are unintentional.    Patient: [name]  Service Category: E/M  Provider: Gillis Santa, MD  DOB: Jul 03, 1931  DOS: 02/12/2020  Specialty: Interventional Pain Management  MRN: [id number]  Setting: Ambulatory outpatient  PCP: [name], MD  Type: Established Patient    Referring Provider: [name], MD  Location: Office  Delivery: Face-to-face     HPI  [name], a 86 y.o. year old male, is here today because of his Neuropathic pain [M79.2]. [name] primary complain today is Foot Pain (bilateral) Last encounter: My last encounter with him was on 12/19/2019. Pertinent problems: [name] has CKD (chronic kidney disease) stage 4, GFR 15-29 ml/min ([place]); Spinal stenosis of lumbar region without neurogenic claudication; Lumbar spondylosis; Chronic pain of both lower extremities; Neuropathic pain; Chronic pain syndrome; and CKD stage 3 due to type 2 diabetes mellitus ([name]) on their pertinent problem list. Pain Assessment: Severity of Chronic pain is reported as a 2 /10. Location: Foot Right, Left/denies. Onset: More than a month ago. Quality: Burning, Aching, Sharp. Timing: Constant. Modifying factor(s): medication. Vitals:  height is 6' (1.829 m) and weight is 200 lb (90.7 kg). His temperature is 97.6 F (36.4 C). His blood pressure is 118/76 and his pulse is 66. His respiration is 18 and oxygen saturation is 96%.   Reason for encounter: medication management.   [name] follows up today for medication management. He is not finding much benefit with the  addition of an extended release oxycodone, xtampza to help manage his chronic pain syndrome. He continues hydrocodone 10 mg every 4-5 hours as needed for breakthrough pain. He is encountering more episodes of nausea. He does take Zofran as needed. He describes burning pain in both of his feet. Denies any bladder or bowel dysfunction. Was previously on gabapentin but became less effective.  We discussed discontinuing his Xtampza ER since the patient is not noticing any analgesic benefit. Continue hydrocodone as prescribed I recommend that we wean slowly as the patient is experiencing persistent and chronic nausea. We discussed the addition of Lyrica as below. Risks and benefits reviewed and patient would like to proceed. Addition of Lyrica could help with hydrocodone wean.  Pharmacotherapy Assessment   Analgesic: Hydrocodone 10 mg every 4-6 hours as needed, quantity 150/month; MME equals 50    Monitoring: [place] PMP: PDMP not reviewed this encounter.       Pharmacotherapy: No side-effects or adverse reactions reported. Compliance: No problems identified. Effectiveness: Clinically acceptable.  [name], RN  02/12/2020 10:51 AM  Sign when Signing Visit Nursing Pain Medication Assessment:  Safety precautions to be maintained throughout the outpatient stay will include: orient to surroundings, keep bed in low position, maintain call bell within reach at all times, provide assistance with transfer out of bed and ambulation.  Medication Inspection Compliance: Pill count conducted under aseptic conditions, in front of the patient. Neither the pills nor the bottle was removed from the patient's sight at any time. Once count was completed pills were immediately returned to the patient in their original bottle.  Medication: Hydrocodone/APAP Pill/Patch Count: 19 of 150 pills remain Pill/Patch Appearance: Markings consistent with prescribed medication Bottle  Appearance: Standard pharmacy container. Clearly  labeled. Filled Date: 10/ 11 / 2021 Last Medication intake:  Today  xtampza 33/60 Filled 01-19-2020 Last took today   UDS:  Summary  Date Value Ref Range Status  09/18/2019 Note  Final    Comment:    ==================================================================== [employer] Select 13 (MW) ==================================================================== Test                             Result       Flag       Units  Drug Present and Declared for Prescription Verification   Hydrocodone                    1687         EXPECTED   ng/mg creat   Hydromorphone                  2436         EXPECTED   ng/mg creat   Dihydrocodeine                 436          EXPECTED   ng/mg creat   Norhydrocodone                 1384         EXPECTED   ng/mg creat    Sources of hydrocodone include scheduled prescription medications.    Hydromorphone, dihydrocodeine and norhydrocodone are expected    metabolites of hydrocodone. Hydromorphone and dihydrocodeine are    also available as scheduled prescription medications.  ==================================================================== Test                      Result    Flag   Units      Ref Range   Creatinine              45               mg/dL      >=00 ==================================================================== Declared Medications:  The flagging and interpretation on this report are based on the  following declared medications.  Unexpected results may arise from  inaccuracies in the declared medications.   **Note: The testing scope of this panel includes these medications:   Hydrocodone (Norco)  Hydrocodone   **Note: The testing scope of this panel does not include the  following reported medications:   Acetaminophen (Norco)  Amlodipine (Norvasc)  Aspirin  Carvedilol (Coreg)  Clopidogrel (Plavix)  Furosemide (Lasix)  Helium  Losartan (Cozaar)  Nitroglycerin (Nitrostat)  Ondansetron (Zofran)  Oxygen  Pantoprazole  (Protonix)  Polyethylene Glycol  Tamsulosin (Flomax) ==================================================================== For clinical consultation, please call [phone]. ====================================================================      ROS  Constitutional: Denies any fever or chills Gastrointestinal: No reported hemesis, hematochezia, vomiting, or acute GI distress Musculoskeletal: Denies any acute onset joint swelling, redness, loss of ROM, or weakness Neurological: No reported episodes of acute onset apraxia, aphasia, dysarthria, agnosia, amnesia, paralysis, loss of coordination, or loss of consciousness  Medication Review  DHA-Vitamin C-Lutein, HYDROcodone-acetaminophen, ONE TOUCH ULTRA 2, OneTouch Delica Lancets 33G, Oxygen-Helium, Semaglutide, amLODipine, aspirin EC, carvedilol, clopidogrel, furosemide, glucose blood, losartan, nitroGLYCERIN, ondansetron, pantoprazole, polyethylene glycol, pregabalin, and tamsulosin  History Review  Allergy: Mr. Lamountain is allergic to doxazosin mesylate and methocarbamol. Drug: Mr. Pichardo  reports no history of drug use. Alcohol:  reports no history of alcohol use. Tobacco:  reports  that he quit smoking about 56 years ago. His smoking use included cigarettes. He has a 20.00 pack-year smoking history. He has quit using smokeless tobacco. Social: Mr. Dung  reports that he quit smoking about 56 years ago. His smoking use included cigarettes. He has a 20.00 pack-year smoking history. He has quit using smokeless tobacco. He reports that he does not drink alcohol and does not use drugs. Medical:  has a past medical history of Arthritis, CAD (coronary artery disease), Cataract, Chronic diastolic CHF (congestive heart failure) ([place]), Chronic kidney disease, stage III (moderate) ([place]), Chronic lower back pain, Colon polyps, COVID-19, Diverticulosis, Dyspnea (2009 since July -Sept), Enlarged prostate, Esophageal stricture, GERD (gastroesophageal  reflux disease), Heart murmur, Hiatal hernia, History of carpal tunnel syndrome, History of kidney stones, History of PFTs, Hyperlipidemia, Hypertension, Interstitial lung disease ([place]), Iron deficiency anemia, Nausea & vomiting, On home oxygen therapy, Osteoporosis, Overweight (BMI 25.0-29.9), Peripheral neuropathy, RA (rheumatoid arthritis) ([place]), Seropositive rheumatoid arthritis ([place]), Type II diabetes mellitus ([place]), Wears glasses, and Wears partial dentures. Surgical: Mr. Kramp  has a past surgical history that includes Total knee arthroplasty (Right, 03/2010); Knee arthroscopy (Right, 2008); Cataract extraction w/ intraocular lens  implant, bilateral (Bilateral); Coronary artery bypass graft (11/1999); Coronary stent placement (02/2012); Shoulder arthroscopy with open rotator cuff repair and distal clavicle acrominectomy (Left, 02/27/2013); Trigger finger release (Left, 02/27/2013); Cholecystectomy open (11/2003); Joint replacement; Esophagogastroduodenoscopy (egd) with esophageal dilation (2010); Cardiac catheterization (08/2004); Cardiac catheterization (12/31/2011); Cardiac catheterization (2009); left and right heart catheterization with coronary angiogram (12/31/2011); percutaneous coronary stent intervention (pci-s) (12/31/2011); percutaneous coronary stent intervention (pci-s) (N/A, 01/03/2012); Percutaneous coronary intervention-balloon only (01/03/2012); left and right heart catheterization with coronary angiogram (N/A, 01/14/2014); Hand surgery; Cardiac catheterization (N/A, 08/13/2015); Esophagogastroduodenoscopy (N/A, 03/01/2017); maloney dilation (03/01/2017); Esophagogastroduodenoscopy (egd) with propofol (N/A, 09/12/2018); Botox injection (N/A, 09/12/2018); Balloon dilation (N/A, 09/12/2018); Coronary angioplasty with stent (01/03/2012); Coronary angioplasty with stent (01/14/2014); Esophagogastroduodenoscopy (egd) with propofol (N/A, 11/27/2018); Botox injection (N/A, 11/27/2018); and Esophageal dilation  (11/27/2018). Family: family history includes Alcohol abuse in his sister; COPD in his mother; Colon cancer (age of onset: 82) in his brother; Diabetes in his brother; Heart attack in his father; Heart disease in his father; Stomach cancer in his brother; Stroke in his sister.  Laboratory Chemistry Profile   Renal Lab Results  Component Value Date   BUN 33 (H) 06/13/2019   CREATININE 2.06 (H) 06/13/2019   BCR 13 06/24/2017   GFR 30.78 (L) 06/13/2019   GFRAA 30 (L) 06/24/2017   GFRNONAA 26 (L) 06/24/2017     Hepatic Lab Results  Component Value Date   AST 14 06/13/2019   ALT 9 06/13/2019   ALBUMIN 4.1 06/13/2019   ALBUMIN 4.1 06/13/2019   ALKPHOS 58 06/13/2019     Electrolytes Lab Results  Component Value Date   NA 133 (L) 06/13/2019   K 4.6 06/13/2019   CL 96 06/13/2019   CALCIUM 9.9 06/13/2019   MG 1.9 04/08/2017   PHOS 2.9 06/13/2019     Bone No results found for: VD25OH, VD125OH2TOT, [id number], EP3295JO8, 25OHVITD1, 25OHVITD2, 25OHVITD3, TESTOFREE, TESTOSTERONE   Inflammation (CRP: Acute Phase) (ESR: Chronic Phase) Lab Results  Component Value Date   ESRSEDRATE 38 (H) 11/22/2011       Note: Above Lab results reviewed.   Physical Exam  General appearance: Well nourished, well developed, and well hydrated. In no apparent acute distress Mental status: Alert, oriented x 3 (person, place, & time)       Respiratory: No evidence  of acute respiratory distress Eyes: PERLA Vitals: BP 118/76   Pulse 66   Temp 97.6 F (36.4 C)   Resp 18   Ht 6' (1.829 m)   Wt 200 lb (90.7 kg)   SpO2 96%   BMI 27.12 kg/m  BMI: Estimated body mass index is 27.12 kg/m as calculated from the following:   Height as of this encounter: 6' (1.829 m).   Weight as of this encounter: 200 lb (90.7 kg). Ideal: Ideal body weight: 77.6 kg (171 lb 1.2 oz) Adjusted ideal body weight: 82.8 kg (182 lb 10.3 oz)  Lumbar Spine Area Exam  Skin & Axial Inspection:No masses, redness, or  swelling Alignment:Symmetrical Functional XKG:YJEHUDJSH ROM, slightly improved after treatment Stability:No instability detected Muscle Tone/Strength:Functionally intact. No obvious neuro-muscular anomalies detected. Sensory (Neurological):Musculoskeletal pain pattern, improved after treatment Palpation:No palpable anomalies Provocative Tests: Hyperextension/rotation test:(+)bilaterally for facet joint pain. Lumbar quadrant test (Kemp's test):(+)bilaterally for facet joint pain.  Gait & Posture Assessment  Ambulation:Unassisted Gait:Relatively normal for age and body habitus Posture:WNL Lower Extremity Exam    Side:Right lower extremity  Side:Left lower extremity  Stability:No instability observed  Stability:No instability observed  Skin & Extremity Inspection:Skin color, temperature, and hair growth are WNL. No peripheral edema or cyanosis. No masses, redness, swelling, asymmetry, or associated skin lesions. No contractures.  Skin & Extremity Inspection:Skin color, temperature, and hair growth are WNL. No peripheral edema or cyanosis. No masses, redness, swelling, asymmetry, or associated skin lesions. No contractures.  Functional FWY:OVZCHYIFO ROM   Functional YDX:AJOINOMVE ROM   Muscle Tone/Strength:Functionally intact. No obvious neuro-muscular anomalies detected.  Muscle Tone/Strength:Functionally intact. No obvious neuro-muscular anomalies detected.  Sensory (Neurological):Neuropathic pain pattern  Sensory (Neurological):Neuropathic pain pattern  DTR: Patellar:deferred today Achilles:deferred today Plantar:deferred today  DTR: Patellar:deferred today Achilles:deferred today Plantar:deferred today  Palpation:No palpable anomalies  Palpation:No palpable anomalies     Assessment   Status Diagnosis  Persistent Persistent Persistent 1. Neuropathic pain   2.  Diabetic polyneuropathy associated with type 2 diabetes mellitus ([place])   3. Lumbar facet arthropathy   4. Lumbar spondylosis   5. Long term prescription opiate use   6. Chronic pain syndrome       Plan of Care   Mr. CORRIE BRANNEN has a current medication list which includes the following long-term medication(s): furosemide, nitroglycerin, pantoprazole, and pregabalin.   1. Discontinue Xtampza ER given no analgesic benefit and increased side effects of nausea 2. Continue hydrocodone as prescribed, will wean to quantity 120 for his third prescription. 3. Patient has tried and failed gabapentin. Start Lyrica as below.  Pharmacotherapy (Medications Ordered): Meds ordered this encounter  Medications  . HYDROcodone-acetaminophen (NORCO) 10-325 MG tablet    Sig: Take 2 tablets by mouth 3 (three) times daily as needed for severe pain. Must last 30 days. Max 5 tablets/day    Dispense:  150 tablet    Refill:  0    Chronic Pain. (STOP Act - Not applicable). Fill one day early if closed on scheduled refill date.  [name] HYDROcodone-acetaminophen (NORCO) 10-325 MG tablet    Sig: Take 2 tablets by mouth 3 (three) times daily as needed for severe pain. Must last 30 days. Max 5 tablets/day    Dispense:  150 tablet    Refill:  0    Chronic Pain. (STOP Act - Not applicable). Fill one day early if closed on scheduled refill date.  [name] HYDROcodone-acetaminophen (NORCO) 10-325 MG tablet    Sig: Take 2 tablets by mouth 2 (two)  times daily as needed for severe pain. Must last 30 days. Max 4 tablets/day    Dispense:  120 tablet    Refill:  0    Chronic Pain. (STOP Act - Not applicable). Fill one day early if closed on scheduled refill date.  . pregabalin (LYRICA) 25 MG capsule    Sig: Take 1 capsule (25 mg total) by mouth at bedtime for 20 days, THEN 2 capsules (50 mg total) at bedtime.    Dispense:  160 capsule    Refill:  0   Follow-up plan:   Return in about 3 months (around 05/14/2020) for Medication  Management, in person.   Recent Visits No visits were found meeting these conditions. Showing recent visits within past 90 days and meeting all other requirements Today's Visits Date Type Provider Dept  02/12/20 Office Visit Gillis Santa, MD [facility]  Showing today's visits and meeting all other requirements Future Appointments No visits were found meeting these conditions. Showing future appointments within next 90 days and meeting all other requirements  I discussed the assessment and treatment plan with the patient. The patient was provided an opportunity to ask questions and all were answered. The patient agreed with the plan and demonstrated an understanding of the instructions.  Patient advised to call back or seek an in-person evaluation if the symptoms or condition worsens.  Duration of encounter: 30 minutes.  Note by: Gillis Santa, MD Date: 02/12/2020; Time: 1:25 PM

## 2020-02-14 DIAGNOSIS — H353121 Nonexudative age-related macular degeneration, left eye, early dry stage: Secondary | ICD-10-CM | POA: Diagnosis not present

## 2020-02-14 DIAGNOSIS — Z961 Presence of intraocular lens: Secondary | ICD-10-CM | POA: Diagnosis not present

## 2020-02-14 DIAGNOSIS — H353213 Exudative age-related macular degeneration, right eye, with inactive scar: Secondary | ICD-10-CM | POA: Diagnosis not present

## 2020-02-14 DIAGNOSIS — E119 Type 2 diabetes mellitus without complications: Secondary | ICD-10-CM | POA: Diagnosis not present

## 2020-02-14 LAB — HM DIABETES EYE EXAM

## 2020-02-16 ENCOUNTER — Other Ambulatory Visit (HOSPITAL_COMMUNITY)
Admission: RE | Admit: 2020-02-16 | Discharge: 2020-02-16 | Disposition: A | Discharge status: Home / Self Care | Payer: Medicare Other | Source: Ambulatory Visit | Attending: Internal Medicine | Admitting: Internal Medicine

## 2020-02-16 DIAGNOSIS — Z01812 Encounter for preprocedural laboratory examination: Secondary | ICD-10-CM | POA: Insufficient documentation

## 2020-02-16 DIAGNOSIS — J8410 Pulmonary fibrosis, unspecified: Secondary | ICD-10-CM | POA: Diagnosis not present

## 2020-02-16 DIAGNOSIS — Z20822 Contact with and (suspected) exposure to covid-19: Secondary | ICD-10-CM | POA: Diagnosis not present

## 2020-02-16 DIAGNOSIS — R0689 Other abnormalities of breathing: Secondary | ICD-10-CM | POA: Diagnosis not present

## 2020-02-16 LAB — SARS CORONAVIRUS 2 (TAT 6-24 HRS): SARS Coronavirus 2: NEGATIVE

## 2020-02-19 NOTE — Anesthesia Preprocedure Evaluation (Addendum)
Anesthesia Evaluation  Patient identified by MRN, date of birth, ID band Patient awake    Reviewed: Allergy & Precautions, NPO status , Patient's Chart, lab work & pertinent test results, reviewed documented beta blocker date and time   History of Anesthesia Complications Negative for: history of anesthetic complications  Airway Mallampati: II  TM Distance: >3 FB Neck ROM: Full    Dental  (+) Dental Advisory Given, Partial Upper,    Pulmonary sleep apnea and Oxygen sleep apnea , former smoker,  Interstitial lung disease   Pulmonary exam normal        Cardiovascular hypertension, Pt. on medications and Pt. on home beta blockers + CAD (on Plavix), + Cardiac Stents (2013, 2015, 2017), + CABG (2001) and +CHF  Normal cardiovascular exam  TTE 01/2018: moderate LVH, EF 50-55%, akinesis of inferior myocardium, grade 1 diastolic dysfunction, mild AS, atrial septal aneurysm    Neuro/Psych negative neurological ROS     GI/Hepatic Neg liver ROS, hiatal hernia, GERD  Medicated and Controlled,Achalasia   Endo/Other  diabetes, Type 2  Renal/GU Renal InsufficiencyRenal disease     Musculoskeletal  (+) Arthritis , Rheumatoid disorders,    Abdominal   Peds  Hematology negative hematology ROS (+)   Anesthesia Other Findings Day of surgery medications reviewed with the patient.  Reproductive/Obstetrics                            Anesthesia Physical  Anesthesia Plan  ASA: III  Anesthesia Plan: MAC   Post-op Pain Management:    Induction:   PONV Risk Score and Plan: 1 and Treatment may vary due to age or medical condition and Propofol infusion  Airway Management Planned: Natural Airway and Nasal Cannula  Additional Equipment: None  Intra-op Plan:   Post-operative Plan:   Informed Consent: I have reviewed the patients History and Physical, chart, labs and discussed the procedure including the  risks, benefits and alternatives for the proposed anesthesia with the patient or authorized representative who has indicated his/her understanding and acceptance.     Dental advisory given  Plan Discussed with: CRNA and Anesthesiologist  Anesthesia Plan Comments:        Anesthesia Quick Evaluation

## 2020-02-20 ENCOUNTER — Ambulatory Visit (HOSPITAL_COMMUNITY): Payer: Medicare Other | Admitting: Anesthesiology

## 2020-02-20 ENCOUNTER — Ambulatory Visit (HOSPITAL_COMMUNITY)
Admission: RE | Admit: 2020-02-20 | Discharge: 2020-02-20 | Disposition: A | Discharge status: Home / Self Care | Payer: Medicare Other | Attending: Internal Medicine | Admitting: Internal Medicine

## 2020-02-20 ENCOUNTER — Encounter (HOSPITAL_COMMUNITY)
Admission: RE | Disposition: A | Discharge status: Home / Self Care | Payer: Self-pay | Source: Home / Self Care | Attending: Internal Medicine

## 2020-02-20 ENCOUNTER — Encounter (HOSPITAL_COMMUNITY): Payer: Self-pay | Admitting: Internal Medicine

## 2020-02-20 ENCOUNTER — Other Ambulatory Visit: Payer: Self-pay

## 2020-02-20 DIAGNOSIS — R1310 Dysphagia, unspecified: Secondary | ICD-10-CM | POA: Insufficient documentation

## 2020-02-20 DIAGNOSIS — K440 Diaphragmatic hernia with obstruction, without gangrene: Secondary | ICD-10-CM | POA: Diagnosis not present

## 2020-02-20 DIAGNOSIS — E785 Hyperlipidemia, unspecified: Secondary | ICD-10-CM | POA: Diagnosis not present

## 2020-02-20 DIAGNOSIS — Z800 Family history of malignant neoplasm of digestive organs: Secondary | ICD-10-CM | POA: Diagnosis not present

## 2020-02-20 DIAGNOSIS — I2510 Atherosclerotic heart disease of native coronary artery without angina pectoris: Secondary | ICD-10-CM | POA: Insufficient documentation

## 2020-02-20 DIAGNOSIS — Z87891 Personal history of nicotine dependence: Secondary | ICD-10-CM | POA: Diagnosis not present

## 2020-02-20 DIAGNOSIS — J849 Interstitial pulmonary disease, unspecified: Secondary | ICD-10-CM | POA: Diagnosis not present

## 2020-02-20 DIAGNOSIS — Z7902 Long term (current) use of antithrombotics/antiplatelets: Secondary | ICD-10-CM | POA: Insufficient documentation

## 2020-02-20 DIAGNOSIS — G4733 Obstructive sleep apnea (adult) (pediatric): Secondary | ICD-10-CM | POA: Diagnosis not present

## 2020-02-20 DIAGNOSIS — K222 Esophageal obstruction: Secondary | ICD-10-CM | POA: Diagnosis not present

## 2020-02-20 DIAGNOSIS — K220 Achalasia of cardia: Secondary | ICD-10-CM | POA: Diagnosis not present

## 2020-02-20 HISTORY — PX: ESOPHAGOGASTRODUODENOSCOPY (EGD) WITH PROPOFOL: SHX5813

## 2020-02-20 HISTORY — PX: BOTOX INJECTION: SHX5754

## 2020-02-20 HISTORY — PX: BALLOON DILATION: SHX5330

## 2020-02-20 LAB — GLUCOSE, CAPILLARY: Glucose-Capillary: 216 mg/dL — ABNORMAL HIGH (ref 70–99)

## 2020-02-20 SURGERY — ESOPHAGOGASTRODUODENOSCOPY (EGD) WITH PROPOFOL
Anesthesia: Monitor Anesthesia Care

## 2020-02-20 MED ORDER — LACTATED RINGERS IV SOLN
INTRAVENOUS | Status: DC
  Administered 2020-02-20: 1000 mL via INTRAVENOUS

## 2020-02-20 MED ORDER — LACTATED RINGERS IV SOLN: INTRAVENOUS | Status: DC | PRN

## 2020-02-20 MED ORDER — GLYCOPYRROLATE 0.2 MG/ML IJ SOLN
INTRAMUSCULAR | Status: DC | PRN
  Administered 2020-02-20: .1 mg via INTRAVENOUS

## 2020-02-20 MED ORDER — PROPOFOL 500 MG/50ML IV EMUL
INTRAVENOUS | Status: DC | PRN
  Administered 2020-02-20: 300 ug/kg/min via INTRAVENOUS

## 2020-02-20 MED ORDER — PROPOFOL 1000 MG/100ML IV EMUL
INTRAVENOUS | Status: AC
  Filled 2020-02-20: qty 100

## 2020-02-20 MED ORDER — LIDOCAINE HCL (CARDIAC) PF 100 MG/5ML IV SOSY
PREFILLED_SYRINGE | INTRAVENOUS | Status: DC | PRN
  Administered 2020-02-20: 75 mg via INTRAVENOUS

## 2020-02-20 MED ORDER — SODIUM CHLORIDE (PF) 0.9 % IJ SOLN
INTRAMUSCULAR | Status: AC
  Filled 2020-02-20: qty 10

## 2020-02-20 MED ORDER — ONABOTULINUMTOXINA 100 UNITS IJ SOLR
INTRAMUSCULAR | Status: AC
  Filled 2020-02-20: qty 100

## 2020-02-20 MED ORDER — SODIUM CHLORIDE 0.9 % IV SOLN: INTRAVENOUS | Status: DC

## 2020-02-20 MED ORDER — SODIUM CHLORIDE (PF) 0.9 % IJ SOLN
INTRAMUSCULAR | Status: DC | PRN
  Administered 2020-02-20: 4 mL via SUBMUCOSAL

## 2020-02-20 SURGICAL SUPPLY — 14 items

## 2020-02-20 NOTE — Op Note (Signed)
[facility] Patient Name: [name] Procedure Date: 02/20/2020 MRN: [id number] Attending MD: Docia Chuck. Henrene Pastor MD, MD Date of Birth: [birth year] CSN: [id number] Age: 86 Admit Type: Outpatient Procedure:                Upper GI endoscopy with Botox injection; with                            balloon dilation?"20 mm max Indications:              Therapeutic procedure, Dysphagia, achalasia Providers:                Docia Chuck. Henrene Pastor MD, MD, Benetta Spar, Technician,                            Laverda Sorenson, Technician, Enrigue Catena, CRNA,                            Cleda Daub, RN Referring MD:             Dr.Aubreyana Saltz Medicines:                Monitored Anesthesia Care Complications:            No immediate complications. Estimated Blood Loss:     Estimated blood loss: none. Procedure:                Pre-Anesthesia Assessment:                           - Prior to the procedure, a History and Physical                            was performed, and patient medications and                            allergies were reviewed. The patient's tolerance of                            previous anesthesia was also reviewed. The risks                            and benefits of the procedure and the sedation                            options and risks were discussed with the patient.                            All questions were answered, and informed consent                            was obtained. Prior Anticoagulants: The patient has                            taken Plavix (clopidogrel), last dose was 6 days  prior to procedure. ASA Grade Assessment: III - A                            patient with severe systemic disease. After                            reviewing the risks and benefits, the patient was                            deemed in satisfactory condition to undergo the                            procedure.                           After obtaining  informed consent, the endoscope was                            passed under direct vision. Throughout the                            procedure, the patient's blood pressure, pulse, and                            oxygen saturations were monitored continuously. The                            GIF-H190 ([id number] Olympus gastroscope was                            introduced through the mouth, and advanced to the                            second part of duodenum. The upper GI endoscopy was                            accomplished without difficulty. The patient                            tolerated the procedure well. Scope In: Scope Out: Findings:      The esophagus revealed mild stricturing at the gastroesophageal junction       (40 cm). The area was successfully injected with 100 units botulinum       toxin (25 units to each of 4 quadrants 1 cm above the mucosal Z-line at       a 45 degree angle). Subsequently, A TTS dilator was passed through the       scope. Dilation with an 18-19-20 mm balloon dilator was performed to 20       mm.      The stomach was normal, save small hiatal hernia.      The examined duodenum was normal.      The cardia and gastric fundus were normal on retroflexion. Impression:               1. Achalasia. Status post Botox injection followed  by balloon dilation to 20 mm                           2. Otherwise unremarkable EGD. Moderate Sedation:      none Recommendation:           - Patient has a contact number available for                            emergencies. The signs and symptoms of potential                            delayed complications were discussed with the                            patient. Return to normal activities tomorrow.                            Written discharge instructions were provided to the                            patient.                           - Post dilation diet.                           - Continue  present medications.                           - Resume Plavix (clopidogrel) at prior dose today. Procedure Code(s):        --- Professional ---                           [phone], Esophagogastroduodenoscopy, flexible,                            transoral; with transendoscopic balloon dilation of                            esophagus (less than 30 mm diameter) Diagnosis Code(s):        --- Professional ---                           R13.10, Dysphagia, unspecified CPT copyright 2019 [employer]. All rights reserved. The codes documented in this report are preliminary and upon coder review may  be revised to meet current compliance requirements. Docia Chuck. Henrene Pastor MD, MD 02/20/2020 9:09:42 AM This report has been signed electronically. Number of Addenda: 0

## 2020-02-20 NOTE — Discharge Instructions (Signed)
YOU HAD AN ENDOSCOPIC PROCEDURE TODAY: Refer to the procedure report and other information in the discharge instructions given to you for any specific questions about what was found during the examination. If this information does not answer your questions, please call [place] office at [phone] to clarify.   YOU SHOULD EXPECT: Some feelings of bloating in the abdomen. Passage of more gas than usual. Walking can help get rid of the air that was put into your GI tract during the procedure and reduce the bloating. If you had a lower endoscopy (such as a colonoscopy or flexible sigmoidoscopy) you may notice spotting of blood in your stool or on the toilet paper. Some abdominal soreness may be present for a day or two, also.  DIET: Your first meal following the procedure should be a light meal and then it is ok to progress to your normal diet. A half-sandwich or bowl of soup is an example of a good first meal. Heavy or fried foods are harder to digest and may make you feel nauseous or bloated. Drink plenty of fluids but you should avoid alcoholic beverages for 24 hours. If you had a esophageal dilation, please see attached instructions for diet.    ACTIVITY: Your care partner should take you home directly after the procedure. You should plan to take it easy, moving slowly for the rest of the day. You can resume normal activity the day after the procedure however YOU SHOULD NOT DRIVE, use power tools, machinery or perform tasks that involve climbing or major physical exertion for 24 hours (because of the sedation medicines used during the test).   SYMPTOMS TO REPORT IMMEDIATELY: A gastroenterologist can be reached at any hour. Please call [phone]  for any of the following symptoms:   Following upper endoscopy (EGD, EUS, ERCP, esophageal dilation) Vomiting of blood or coffee ground material  New, significant abdominal pain  New, significant chest pain or pain under the shoulder blades  Painful or  persistently difficult swallowing  New shortness of breath  Black, tarry-looking or red, bloody stools  FOLLOW UP:  If any biopsies were taken you will be contacted by phone or by letter within the next 1-3 weeks. Call [phone]  if you have not heard about the biopsies in 3 weeks.  Please also call with any specific questions about appointments or follow up tests.  

## 2020-02-20 NOTE — Interval H&P Note (Signed)
History and Physical Interval Note:  02/20/2020 8:40 AM  [name]  has presented today for surgery, with the diagnosis of achalasia.  The various methods of treatment have been discussed with the patient and family. After consideration of risks, benefits and other options for treatment, the patient has consented to  Procedure(s): ESOPHAGOGASTRODUODENOSCOPY (EGD) WITH PROPOFOL (N/A) BOTOX INJECTION (N/A) as a surgical intervention.  The patient's history has been reviewed, patient examined, no change in status, stable for surgery.  I have reviewed the patient's chart and labs.  Questions were answered to the patient's satisfaction.     [name]

## 2020-02-20 NOTE — Anesthesia Procedure Notes (Signed)
Procedure Name: MAC Date/Time: 02/20/2020 8:39 AM Performed by: Lissa Morales, CRNA Pre-anesthesia Checklist: Patient identified, Emergency Drugs available, Suction available, Patient being monitored and Timeout performed Patient Re-evaluated:Patient Re-evaluated prior to induction Oxygen Delivery Method: Simple face mask Preoxygenation: Pre-oxygenation with 100% oxygen Placement Confirmation: positive ETCO2

## 2020-02-20 NOTE — Transfer of Care (Addendum)
Immediate Anesthesia Transfer of Care Note  Patient: JAMEIRE KOUBA  Procedure(s) Performed: ESOPHAGOGASTRODUODENOSCOPY (EGD) WITH PROPOFOL (N/A ) BOTOX INJECTION (N/A ) BALLOON DILATION (N/A )  Patient Location: PACU  Anesthesia Type:MAC  Level of Consciousness: awake, alert , oriented and patient cooperative  Airway & Oxygen Therapy: Patient Spontanous Breathing and Patient connected to face mask oxygen  Post-op Assessment: Report given to RN, Post -op Vital signs reviewed and stable and Patient moving all extremities X 4  Post vital signs: stable  Last Vitals:  Vitals Value Taken Time  BP    Temp    Pulse 69 02/20/20 0900  Resp 23 02/20/20 0900  SpO2 100 % 02/20/20 0900  Vitals shown include unvalidated device data.  Last Pain:  Vitals:   02/20/20 0816  TempSrc: Oral  PainSc: 0-No pain         Complications: No complications documented.

## 2020-02-20 NOTE — Anesthesia Postprocedure Evaluation (Signed)
Anesthesia Post Note  Patient: [name]  Procedure(s) Performed: ESOPHAGOGASTRODUODENOSCOPY (EGD) WITH PROPOFOL (N/A ) BOTOX INJECTION (N/A ) BALLOON DILATION (N/A )     Patient location during evaluation: PACU Anesthesia Type: MAC Level of consciousness: awake and alert Pain management: pain level controlled Vital Signs Assessment: post-procedure vital signs reviewed and stable Respiratory status: spontaneous breathing and respiratory function stable Cardiovascular status: stable Postop Assessment: no apparent nausea or vomiting Anesthetic complications: no   No complications documented.  Last Vitals:  Vitals:   02/20/20 0925 02/20/20 0930  BP: (!) 152/58 (!) 145/68  Pulse: 74 73  Resp: (!) 21 (!) 22  Temp:    SpO2: 96% 98%    Last Pain:  Vitals:   02/20/20 0930  TempSrc:   PainSc: 0-No pain                 Kanaya Gunnarson DANIEL

## 2020-02-24 ENCOUNTER — Encounter (HOSPITAL_COMMUNITY): Payer: Self-pay | Admitting: Internal Medicine

## 2020-02-26 ENCOUNTER — Encounter: Payer: Self-pay | Admitting: Internal Medicine

## 2020-02-26 ENCOUNTER — Ambulatory Visit: Payer: Medicare Other | Admitting: Internal Medicine

## 2020-02-26 ENCOUNTER — Other Ambulatory Visit: Payer: Self-pay

## 2020-02-26 VITALS — BP 114/74 | HR 67 | Temp 98.4°F | Ht 72.0 in | Wt 197.0 lb

## 2020-02-26 DIAGNOSIS — Z7185 Encounter for immunization safety counseling: Secondary | ICD-10-CM | POA: Diagnosis not present

## 2020-02-26 DIAGNOSIS — J849 Interstitial pulmonary disease, unspecified: Secondary | ICD-10-CM | POA: Diagnosis not present

## 2020-02-26 DIAGNOSIS — R0600 Dyspnea, unspecified: Secondary | ICD-10-CM | POA: Diagnosis not present

## 2020-02-26 NOTE — Progress Notes (Signed)
DOB: 02-Feb-1932, 86 y.o.   MRN: [id number]  HPI  86 yo male former smoker with RA-ILD   TEST     - Coronary artery disease. s/p CABG x 5 in 2002.  Dyspnea started following lopressor Jan-July 2009 and lisinopril increae July 2009. No relief despite stopping agents July 2010  -   Myoview on April 17, 2007 was  nonischemic with an EF of 51% and inferobasal wall scar.   - CAth Oct 2009:    - . Severe native three-vessel coronary artery disease. 2. Status post multivessel coronary bypass surgery with all grafts  patent. Normal LVEF and LVEDP - PFT - Mixed pattern on spiro. Mild restn on lung volumeswith near normal DLCO.   - Fev1 2.2L/73%, FVC 3.21L/70%, Ratio 68 (67),  TLC 4.7L/68%, RV 1.5L/55%, DLCO 79%.  -CPST 05/06/2008: Normal effot.    - Reduced VO2 max 20.5/65%, REduced AT 8.2/40%, Normal breathing reservce of 55%, submaximal heart rate response 112/77%,  flattened O2 pulse response at peak exercise - 15m/beat at 85%. No VQ mismatch abnormalities. - ECHO 09/04/2008: LVH, ef 60%, mild AS and unchanged from prior   - CT chest   - Non specific Interstitial Lung disease NOS. Stable pulm infilratates RLL ggo > LLL ggo. 03/2008 -> 10/2008 -> June 2011: stable - Rehab: never attended 2009-2013 due to cost co.  2013 Walking desat test : resting 98/94% -> 3 laps x 185 feet; HR 113/pulse ox 96%    PFTs July 2013 show worsening restriction since 2010 along with reduced diffusion. The RLL baseline GGO might be worse in July 2013 compared to 2011. In addition, autommune profile shows VERY HIGH TITERS of CCP antibody > 300 which is pathognomonic of rheumtoid arthritis lung involvement. RF is only borderline elevated a 23  PFT 10/12/11>>FEV1 2.02 (70%), Fvc 2.8/72%, TLC 4.07 (59%), DLCO 62%, no BD: RESTRICTION - WORSE since 2010 -(2010:  Fev1 2.2L/73%, FVC 3.21L/70%, Ratio 68 (67),  TLC 4.7L/68%, RV 1.5L/55%, DLCO 79%)   Echo 05/04/11>>mild LVH, EF 55 to 667% grade 1 diastolic dysfx, mild  AS, mild MR, PAS 34 mmHg  CT 11/02/11 - calcification suspicious for aortic valve and some GGO esp RLL > LLL (? RLL worse compared to 2011)   LHancock9/27/13: dLM 80%, LAD 100%, circumflex 100%, RCA 100%, proximal SVG-OM 30%, SVG-D1 ok, SVG-RCA 99% distal and 80% ostial PDA distal to graft, LIMA-LAD ok with some left to right collaterals.  PCI 01/03/12: Promus DES to the SVG-RCA and Cutting Balloon angioplasty to the ostial PDA.  Echocardiogram 12/31/11: EF 589-38% grade 1 diastolic dysfunction, mild aortic stenosis, mean gradient 9, mild LAE.   Walking desaturation test on 05/05/2012 185 feet x 3 laps:  did NOT desaturate. Rest pulse ox was 98%, final pulse ox was 96%. HR response 69/min at rest to 82/min at peak exertion.   PFT FVC fev1 ratio BD fev1 TLC DLCO comment intervention  2010 3.2L/70% 2.2L/73%   4.7L/68% 79%    10/12/11 2.8L/72% 2.02L/70% 72  4.07L/59% 62% Worsening restriction since 2010 Rheum will start immuran/pred. Also had PCI in nov 2013  04/28/12 2.6L/59% 1.77L/62% 67  4.0L/59% 15.7/71%     '2015 PFT showed no significant change with FEV1 56% , ratio 66,  FVC 61%.   07/31/2015 Follow up : ILD ? RA related  Pt returns for follow up . Has been have more DOE.  Was set up for a CT chest  PFT on 4/25 showed  FEV1 57%, ratio 71, FVC 58%, no sign BD response , + BD in midflows.  DLCO 40%. (this is decreased from 2015- was 59%.  TLC is similar to 2015 . Has been started on Imuran for RA ILD .  CT chest did not show any sgin change , cont Bilateral LL GGO and bronchiectasis .  Remains on O2 2l/m  At bedtime  . ONO showed desats on RA , O2 was continued.  Has been seen by [facility] last year. Now on Imuran, feels Arthritis pain is better.  Says he is feels he is doing okay, gets winded with walking long distances.  Can walk 1/2 block before stopping.  Has dry cough some days.  PVX and Prevnar 13 are utd.   Followed by Dr. Estil Daft in Rheum. Remains on Imuran. Most pain is in his hands.    Denies chest pain, orthopnea , edema or fever.    OV 12/09/2015   Chief Complaint  Patient presents with  . Follow-up    Feels about the same since the last time we saw him, cough, with some mucus    Follow-up refractory dyspnea in the setting of mild interstitial lung disease from rheumatoid arthritis and coronary artery disease and multifactorial reasons. Refractory to pulmonary rehabilitation.  He did see my nurse practitioner April 2017 and CT chest did not show any changes to his bilateral lower lobe groundglass opacities and bronchiectasis. He is being maintained on Imuran for his rheumatoid arthritis. He is on oxygen 2 L at bedtime.     Had echocardiogram May 2017 that shows elevated pulmonary artery systolic pressure, mild aortic stenosis and diastolic dysfunction. On 08/13/2015 he underwent left heart catheterization by Dr. Sherren Mocha that shows severe native three-vessel coronary artery disease status post bypass with continued patency of the LIMA to LAD and continued patency of the saphenous vein graft to OM with severe stenosis in the proximal body of the graft for which he had successful PCI of a drug-eluting stent treatment of in-stent restenosis. Recommendation is dual antiplatelet therapy lifelong.   He is with his wife currently. He says dyspnea is only some better. He still has significant dyspnea and faitgues more than his wife.   OV 06/07/2016  Chief Complaint  Patient presents with  . Follow-up    Pt feels like his breathing has not been doing good, pt c/o coughing more with greyish colored mucus, more increase sob with exertion, has some chest tightnes, does has some congestion Denies fever   Follow-up refractory dyspnea in the setting of rheumatoid arthritis andand coronary artery disease. Multifactorial dyspnea. On Imuran for rheumatoid arthritis oxygen 2 L at bedtime. He is refractory to pulmonary rehabilitation. He is believed to have some interstitial  lung disease but April 2017 CT chest suggested only mild bronchiectasis  Last seen in September 2017 and at that time as dyspnea with some better after having a cardiac stent. I discussed with his cardiologist about the possibility of having right heart catheterization but it was felt that the elevation in pulmonary artery pressure was not significant enough on the echocardiogram to warrant this. He tells me that he is deteriorated now and this shortness of breath is even worse. He has tried Symbicort in the past without much help. He is willing to try "anything" to help his dyspnea. Walking desaturation test on 06/07/2016 185 feet x 3 laps on RA:  did NOT desaturate. Rest pulse ox was 97%, final pulse ox was 95%. Did only 2  laps and stopped in interim x 2. . HR response 64/min at rest to 70/min at peak exertion. He says he stopped due to dyspnea and chest pain - like always       OV 08/03/2016  Chief Complaint  Patient presents with  . Follow-up    Pt here after HRCT. Pt states he tried the trelegy and states it helped his breathing. Pt denies cough, CP/tightness, and f/c/s.     Refractory dyspnea in this gentleman with cardiac disease and also mixed obstructive restrictive spirometry due to air trapping and bronchiectasis and some groundglass.  Last visit I tried him on a sample of triple inhaler therapy. He says that this only worked temporarily and stop working. Nevertheless he wants another inhaler. He is frustrated by his dyspnea. He and his wife are making for relief from dyspnea. In 2016 he visited [place] and they were considering switching his Imuran to CellCept in order to improve his pulmonary parenchymal problems. He is open to that. However he is not sure he needs follow-up with [facility] because Dr Deeann Dowse left the practice there. The suggestion of mild elevation in pulmonary artery systolic pressures and a previous echocardiogram but it was not significant enough to  warrant cardiology to do right heart catheterization. He is willing to have right heart catheterization if recommended. Overall frustrated. Pulm function test does not show any change in 2 years. And CT scan itself in 2018 appears to be stable    IMPRESSION: 1. Stable mild-to-moderate cylindrical and varicoid bronchiectasis in the bilateral dependent lower lobes. 2. Continued stability of parenchymal banding with associated subpleural reticulation and ground-glass attenuation at the areas of bronchiectasis in the dependent and basilar lower lobes, most consistent with postinfectious/postinflammatory scarring. No findings to suggest interstitial lung disease. 3. Stable smooth pleural thickening and calcification in the dependent basilar right pleural space, most compatible with chronic postinflammatory change. No pleural effusions. 4. Stable mild patchy air trapping in both lungs, suggesting small airways disease. 5. Aortic atherosclerosis. Left main and 3 vessel coronary atherosclerosis status post CABG.    OV 01/31/2017  Chief Complaint  Patient presents with  . Follow-up    Pt states that he has been sick x5 months with nausea and vomiting. C/o SOB with exertion, prod. cough with gray mucus, and occ. CP when SOB.    86 year old male with rheumatoid arthritis and interstitial lung disease. He has refractory dyspnea class III on exertion relieved by rest. He also has associated exertional chest pain.at last visit I had his rheumatologist Dr. D evaluate him in switching his Imuran to CellCeptwhich she did in 11/18/2016. He is on low-dose methotrexate was reduced GFR. He is tolerating the medication well. He complains of nausea and vomiting but this preceded the CellCept and Bactrim by 2 months.he is scheduled for an endoscopy in November 2018. He does not change because of the CellCept and Bactrim.Most recent lab 12/26/2016 fairly stable except mild reduction in hemoglobin. Dr. Keturah Barre has  referred him back to primary care physician.he continues to be overwhelmed by his dyspnea and also associated chest pain with exertion. Was recently saw cardiology and had a stable echocardiogram and EKG according to his history. Cardiologist reassured him in his view of the chest pain is not associated with cardiac disease. He therefore is insisting to me that this is because of lung disease. Walking desaturation test 185 feet 3 laps on room air with a forehead probe: Resting pulse ox 99%. Final pulse ox 98%.  Resting heart rate 80/m. Final heart rate 98 a minute. He did get dyspneic with chest pain as always. This no change in his oxygen desaturation test.\  The only changes on the last 3 weeks is complaining of increased cough congestion in his chestand a grayish sputum that is different from from baseline.   OV 04/29/2017  Chief Complaint  Patient presents with  . Follow-up    Pt states his SOB has become worse and coughing with gray mucus. Pt states abx he was placed on last visit did not clear up symptoms. Cellcept and bactrim were both stopped Tuesday, 04/26/17 pt believes.   Follow-up multifactorial refractory dyspnea in the setting of interstitial lung disease related to rheumatoid arthritis  He has been on CellCept for a while associated with Bactrim.  Earlier this week he saw a rheumatologist Dr. Keturah Barre.  He complained of significant nausea.  She called me we spoke.  Because of lack of improvement with dyspnea with the CellCept regimen recommended by [facility].  We decided to discontinue this.  At the same time was having nausea and there was concern this was related to CellCept/Bactrim.  Today he tells me that since stopping CellCept/Bactrim nausea and vomiting have significantly improved.  He still feels fatigued.  His cardiologist now has it on a monitor because of his refractory dyspnea.  He is frustrated by his dyspnea which he says is exertional and associated with central chest pain.   There are no other new issues.  Recent review of the labs show that he has been anemic early in January 2019 and his chronic kidney disease is getting worse.  The status of these as of today is not known.  Next blood work is only in a month from now/   Walking desaturation test on 04/29/2017 185 feet x 3 laps on ROOM AIR:  did NOT desaturate. Rest pulse ox was 98%, final pulse ox was 95%. HR response 69/min at rest to 94/min at peak exertion. Patient [name]  Did not Desaturate < 88% . [name] yes did  Desaturated </= 3% points. [name] yes did get tachyardic. DID get dyspneic with central chest pain - just like before   OV 08/01/2017    Chief Complaint  Patient presents with  . Follow-up    Pt states he is the same as he was at the last visit, maybe some better. Pt still becomes SOB with exertion, some mild coughing with gray mucus, and occ. chest tightness.   Follow-up multifactorial refractory dyspnea in the setting of interstitial lung disease related to rheumatoid arthritis   86 year old male with rheumatoid arthritis interstitial lung disease and also severe coronary artery disease on medical management.  He is here for follow-up.  There is a 70-monthfollow-up.  In the interim his anemia has [facility].  His exertional chest pain is somewhat better and his dyspnea somewhat better but he still does have baseline exertional dyspnea and chest pain that is refractory to any treatment.  He is no longer on CellCept or Bactrim for his interstitial lung disease after developing nausea.  He has been off immune modulator treatment for 3 months.  He did see Dr. D rheumatologist end of March 2019 and she was wondering about restarting his old Imuran.  At this point in time he is still going through anemia work-up.  He has a colonoscopy upcoming.  He  will have Plavix on hold prior to the colonoscopy.  At this point in  time his walking desaturation test is stable and documented below.  There are no other new issues.  Walking desaturation test on 08/01/2017 185 feet x 3 laps on ROOM AIR:  did not desaturate. Rest pulse ox was 98%, final pulse ox was 98%. HR response 67/min at rest to 86/min at peak exertion. Patient [name]  Did not Desaturate < 88% . [name] did not  Desaturated </= 3% points. [name] did not get tachyardic.  DID get dyspneic with central chest pain - just like before   Results for LIEUTENANT, ABARCA (MRN [id number]) as of 08/01/2017 11:25  Ref. Range 06/24/2017 11:36  Creatinine Latest Ref Range: 0.70 - 1.11 mg/dL 2.28 (H)    Results for LEJUAN, BOTTO (MRN [id number]) as of 08/01/2017 11:25  Ref. Range 06/24/2017 11:36  WBC Latest Ref Range: 3.8 - 10.8 Thousand/uL 7.8   OV 01/17/2019  Subjective:  Patient ID: [name], male , DOB: [birth year] , age 54 y.o. , MRN: [id number] , ADDRESS: [address] [place] [address] Follow-up multifactorial refractory dyspnea in the setting of interstitial lung disease related to rheumatoid arthritis  01/17/2019 -   Chief Complaint  Patient presents with  . ILD (interstitial lung disease)    Feels breathing has not improved. Will take a couple minutes for breathing to get back to normal if he gets short of breath for the last six months.     HPI [name] 86 y.o. -follow-up multifactorial dyspnea setting of  interstitial lung disease secondary to rheumatoid arthritis.  Also refractory dyspnea.  Last seen in April 2019.  Since then have not seen him.  At the last visit we recommended he go back on Imuran but review of the rheumatology notes from January 10, 2019 looks like he is not on any immunosuppressive's.  This because he said previous side effects of the GI tract from both Imuran and CellCept.  With the onset of the pandemic he said he is not eating at restaurants anymore and therefore he had and his wife  have lost a lot of weight.  Despite this loss of weight his shortness of breath he says is worse.  Of note every visit he comes he has progressive dyspnea.  The symptom score itself shows the dyspnea is not bad.  He continues to use 2 L of nasal cannula oxygen at night.  Last CT scan of the chest was in 2018 and last PFT was in 2017.      SYMPTOM SCALE - ILD 01/17/2019   O2 use  only 2 L at night  Shortness of Breath 0 -> 5 scale with 5 being worst (score 6 If unable to do)  At rest 0  Simple tasks - showers, clothes change, eating, shaving 2.5  Household (dishes, doing bed, laundry) 3  Shopping 2  Walking level at own pace 2  Walking keeping up with others of same age 75.5  Walking up Stairs 4  Walking up Hill 4  Total (40 - 48) Dyspnea Score 21  How bad is your cough? 0  How bad is your fatigue 0    Results for ROBERTSON, COLCLOUGH (MRN [id number]) as of 01/17/2019 12:01  Ref. Range 09/18/2013 11:28 07/29/2015 11:12  FVC-Pre Latest Units: L 2.57 2.52  FVC-%Pred-Pre Latest Units: % 61 56    Results for SEDRICK, TOBER (MRN  [id number]) as of 01/17/2019 12:01  Ref. Range 09/18/2013 11:28 07/29/2015 11:12  DLCO unc Latest Units: ml/min/mmHg 19.91 14.87  DLCO unc % pred Latest Units: % 59 40  IMPRESSION: 1. Stable mild-to-moderate cylindrical and varicoid bronchiectasis in the bilateral dependent lower lobes. 2. Continued stability of parenchymal banding with associated subpleural reticulation and ground-glass attenuation at the areas of bronchiectasis in the dependent and basilar lower lobes, most consistent with postinfectious/postinflammatory scarring. No findings to suggest interstitial lung disease. 3. Stable smooth pleural thickening and calcification in the dependent basilar right pleural space, most compatible with chronic postinflammatory change. No pleural effusions. 4. Stable mild patchy air trapping in both lungs, suggesting small airways disease. 5. Aortic  atherosclerosis. Left main and 3 vessel coronary atherosclerosis status post CABG.   Electronically Signed   By: Ilona Sorrel M.D.   On: 07/08/2016 14:11  IMPRESSION: 1. Stable mild-to-moderate cylindrical and varicoid bronchiectasis in the bilateral dependent lower lobes. 2. Continued stability of parenchymal banding with associated subpleural reticulation and ground-glass attenuation at the areas of bronchiectasis in the dependent and basilar lower lobes, most consistent with postinfectious/postinflammatory scarring. No findings to suggest interstitial lung disease. 3. Stable smooth pleural thickening and calcification in the dependent basilar right pleural space, most compatible with chronic postinflammatory change. No pleural effusions. 4. Stable mild patchy air trapping in both lungs, suggesting small airways disease. 5. Aortic atherosclerosis. Left main and 3 vessel coronary atherosclerosis status post CABG.   Electronically Signed   By: Ilona Sorrel M.D.   On: 07/08/2016 14:11  ROS - per HPI  Results for DEMETRIOUS, RAINFORD (MRN [id number]) as of 01/17/2019 12:01  Ref. Range 04/28/2018 12:28  Hemoglobin Latest Ref Range: 13.0 - 17.0 g/dL 15.1   Results for KILLIAN, SCHWER (MRN [id number]) as of 01/17/2019 12:01  Ref. Range 04/28/2018 12:28  Creatinine Latest Ref Range: 0.40 - 1.50 mg/dL 2.24 (H)   2020 -   Chief Complaint  Patient presents with  . Follow-up    PFT performed 10/26 and CT performed 11/3.  Pt states that his breathing is the same as last visit and is still become SOB with activities.   Follow-up multifactorial dyspnea in the setting of rheumatoid arthritis associated with mild interstitial lung disease v cylindrical bronchiectasis and air trapping  HPI [name] 86 y.o. -last visit was approximately 3 weeks ago.  Given his worsening shortness of breath I asked him to do a high-resolution CT chest and also simple walking desaturation test this  visit and also pulmonary function test.  He is here to review that.  He is here with his wife.  He now tells me that his shortness of breath is not as bad as he told me it was at the last visit.  Nevertheless it is present on and off with exertion but relieved by rest.  His pulmonary function test shows continued stability over the last 3 years.  His high-resolution CT chest that I personally visualized shows very mild ILD and some air trapping.  He says inhalers have not helped him in the past.  There are no other new issues.  He continues to maintain a lean body weight.  He uses 2 L of oxygen at night.  He is not interested in any more testing at this point.     SYMPTOM SCALE - ILD 01/17/2019  02/12/2019   O2 use  only 2 L at night 2L at ngith  Shortness of Breath 0 -> 5 scale with  5 being worst (score 6 If unable to do)   At rest 0   Simple tasks - showers, clothes change, eating, shaving 2.5   Household (dishes, doing bed, laundry) 3   Shopping 2   Walking level at own pace 2   Walking keeping up with others of same age 21.5   Walking up Stairs 4   Walking up Hill 4   Total (40 - 48) Dyspnea Score 21   How bad is your cough? 0   How bad is your fatigue 0     Results for MARLAND, REINE (MRN [id number]) as of 02/12/2019 11:44  Ref. Range 09/18/2013 11:28 07/29/2015 11:12 01/29/2019 13:35  FVC-Pre Latest Units: L 2.57 2.52 2.46  FVC-%Pred-Pre Latest Units: % 61 56 59   Results for DERRELL, MILANES (MRN [id number]) as of 02/12/2019 11:44  Ref. Range 09/18/2013 11:28 07/29/2015 11:12 01/29/2019 13:35  DLCO unc Latest Units: ml/min/mmHg 19.91 14.87 18.48  DLCO unc % pred Latest Units: % 59 40 73     Simple office walk 185 feet x  3 laps goal with forehead probe 02/12/2019   O2 used RA  Number laps completed 3  Comments about pace nl  Resting Pulse Ox/HR 99% and 60/min  Final Pulse Ox/HR 98% and 80/min  Desaturated </= 88% no  Desaturated <= 3% points no  Got Tachycardic >/= 90/min no   Symptoms at end of test mild  Miscellaneous comments x     xxxxxxxxxxxxxxxxxxxxxxxxxxxxxxxxxxxxxxx CLINICAL DATA:  Chronic shortness of breath. Evaluate for interstitial lung disease.  EXAM: CT CHEST WITHOUT CONTRAST  TECHNIQUE: Multidetector CT imaging of the chest was performed following the standard protocol without intravenous contrast. High resolution imaging of the lungs, as well as inspiratory and expiratory imaging, was performed.  COMPARISON:  07/08/2016 and 07/08/2015.  FINDINGS: Cardiovascular: Atherosclerotic calcification of the aorta and aortic valve. Heart size normal. No pericardial effusion.  Mediastinum/Nodes: No pathologically enlarged mediastinal or axillary lymph nodes. Hilar regions are difficult to definitively evaluate without IV contrast. Esophagus is grossly unremarkable.  Lungs/Pleura: Linear scarring in the subpleural posterior right upper lobe. Mild cylindrical bronchiectasis. Negative for subpleural reticulation, traction bronchiectasis/bronchiolectasis, ground-glass, architectural distortion or honeycombing. Areas of subpleural scarring in the right lower lobe. Probable tiny calcified granuloma in the left lower lobe. There is air trapping. No pleural fluid. Pleural calcifications in the posterior right hemithorax. Debris is seen dependently in the upper trachea. Airway is otherwise unremarkable.  Upper Abdomen: Visualized portions of the liver, gallbladder and right adrenal gland are unremarkable. Low-attenuation lesion off the upper pole left kidney measures 4.3 cm, incompletely imaged. Visualized portions of the spleen, pancreas, stomach and bowel are grossly unremarkable. Cholecystectomy. No upper abdominal adenopathy.  Musculoskeletal: Degenerative changes in the spine. No worrisome lytic or sclerotic lesions. Flowing anterior osteophytosis in the thoracic spine.  IMPRESSION: 1. Cylindrical bronchiectasis without  evidence of fibrotic interstitial lung disease. 2. Air trapping is indicative of small airways disease. 3.  Aortic atherosclerosis (ICD10-170.0).   Electronically Signed   By: Lorin Picket M.D.   On: 02/06/2019 16:00   ROS - per HPI    OV 02/26/2020  Subjective:  Patient ID: [name], male , DOB: [birth year] , age 69 y.o. , MRN: [id number] , ADDRESS: [address] [place] [address] PCP [name], MD Patient Care Team: [name], MD as PCP - General [name], MD as PCP - Cardiology (Cardiology) [name], [facility] as Pharmacist (Pharmacist)  This Provider for this visit: Treatment Team:  Attending Provider: Brand Males, MD    02/26/2020 -   Chief Complaint  Patient presents with  . Follow-up    ILD, SOB still the same    Follow-up rheumatoid arthritis with mixed restrictive/obstructive lung function slightly low DLCO and significant shortness of breath  -High-resolution CT chest mild ILD (also has velcro crackles at lung base) with air trapping according to Dr. Chase Caller indeterminate/alternative pattern   -Cylindrical bronchiectasis without evidence of fibrotic lung disease per radiology  Out of proportion dyspnea   HPI [name] 86 y.o. - -presents for 1 year follow-up.  He tells me that I have grown old along with him in the last 10 years have been following him.  He is now 86 years old.  He is maintaining his weight.  He continues to have out of proportion dyspnea but he admits is not any worse.  His last pulmonary function test and CT scan was a year ago and it shows stability.  There are no new issues.  He has seen rheumatologist who is then advised that he see me as well.  He continues his oxygen at night.  His symptom score shows stability.  His walking desaturation test shows stability.  He did get a bit dyspneic.  I did note the symptom score is not fully reliable.  He plans to get his Covid booster  vaccine.     SYMPTOM SCALE - ILD 01/17/2019  02/26/2020 197#  O2 use  only 2 L at night At night  Shortness of Breath 0 -> 5 scale with 5 being worst (score 6 If unable to do)   At rest 0 0  Simple tasks - showers, clothes change, eating, shaving 2.5 0  Household (dishes, doing bed, laundry) 3 0  Shopping 2 0  Walking level at own pace 2 0  Walking keeping up with others of same age 65.5 Does not do  Walking up Stairs 4 Does not do  Walking up [place] 4 Does not do  Total (40 - 48) Dyspnea Score 21   How bad is your cough? 0 none  How bad is your fatigue 0 none     Simple office walk 185 feet x  3 laps goal with forehead probe 02/26/2020   O2 used ra  Number laps completed 3  Comments about pace slow  Resting Pulse Ox/HR 97% and 62/min  Final Pulse Ox/HR 95% and 80/min  Desaturated </= 88% no  Desaturated <= 3% points no  Got Tachycardic >/= 90/min no  Symptoms at end of test Dyspnea, 2nd lap  Miscellaneous comments x    PFT Results Latest Ref Rng & Units 01/29/2019 07/29/2015 09/18/2013  FVC-Pre L 2.46 2.52 2.57  FVC-Predicted Pre % 59 56 61  FVC-Post L 2.51 2.60 2.60  FVC-Predicted Post % 60 58 61  Pre FEV1/FVC % % 67 66 66  Post FEV1/FCV % % 69 71 70  FEV1-Pre L 1.64 1.67 1.69  FEV1-Predicted Pre % 56 52 56  FEV1-Post L 1.74 1.84 1.81  DLCO uncorrected ml/min/mmHg 18.48 14.87 19.91  DLCO UNC% % 73 40 59  DLCO corrected ml/min/mmHg - - 19.91  DLCO COR %Predicted % - - 59  DLVA Predicted % 118 77 107  TLC L 5.48 4.81 4.70  TLC % Predicted % 73 62 64  RV % Predicted % 104 77 72   IMPRESSION: 1. Cylindrical bronchiectasis without evidence of fibrotic interstitial lung disease. 2.  Air trapping is indicative of small airways disease. 3.  Aortic atherosclerosis (ICD10-170.0).   Electronically Signed   By: Lorin Picket M.D.   On: 02/06/2019 16:00  ROS - per HPI     has a past medical history of Arthritis, CAD (coronary artery disease), Cataract,  Chronic diastolic CHF (congestive heart failure) ([place]), Chronic kidney disease, stage III (moderate) ([place]), Chronic lower back pain, Colon polyps, COVID-19, Diverticulosis, Dyspnea (2009 since July -Sept), Enlarged prostate, Esophageal stricture, GERD (gastroesophageal reflux disease), Heart murmur, Hiatal hernia, History of carpal tunnel syndrome, History of kidney stones, History of PFTs, Hyperlipidemia, Hypertension, Interstitial lung disease ([place]), Iron deficiency anemia, Nausea & vomiting, On home oxygen therapy, Osteoporosis, Overweight (BMI 25.0-29.9), Peripheral neuropathy, RA (rheumatoid arthritis) ([place]), Seropositive rheumatoid arthritis ([place]), Type II diabetes mellitus ([place]), Wears glasses, and Wears partial dentures.   reports that he quit smoking about 56 years ago. His smoking use included cigarettes. He has a 20.00 pack-year smoking history. He has quit using smokeless tobacco.  Past Surgical History:  Procedure Laterality Date  . BALLOON DILATION N/A 09/12/2018   Procedure: BALLOON DILATION;  Surgeon: Irene Shipper, MD;  Location: [facility];  Service: Endoscopy;  Laterality: N/A;  . BALLOON DILATION N/A 02/20/2020   Procedure: BALLOON DILATION;  Surgeon: Irene Shipper, MD;  Location: [facility];  Service: Endoscopy;  Laterality: N/A;  . BOTOX INJECTION N/A 09/12/2018   Procedure: BOTOX INJECTION;  Surgeon: Irene Shipper, MD;  Location: [facility];  Service: Endoscopy;  Laterality: N/A;  . BOTOX INJECTION N/A 11/27/2018   Procedure: BOTOX INJECTION;  Surgeon: Irene Shipper, MD;  Location: [facility];  Service: Endoscopy;  Laterality: N/A;  . BOTOX INJECTION N/A 02/20/2020   Procedure: BOTOX INJECTION;  Surgeon: Irene Shipper, MD;  Location: [facility];  Service: Endoscopy;  Laterality: N/A;  . CARDIAC CATHETERIZATION  08/2004   CP- no MI, Cath- small vessell disease   . CARDIAC CATHETERIZATION  12/31/2011   80% distal LM, 100% native LAD, LCx and RCA, 30% prox SVG-OM, SVG-D1  normal, 99% distal, 80% ostial SVG-RCA distal to graft, LIMA-LAD normal; LVEF mildly decreased with posterior basal AK   . CARDIAC CATHETERIZATION  2009   with patent grafts/notes 12/31/2011  . CARDIAC CATHETERIZATION N/A 08/13/2015   Procedure: Left Heart Cath and Cors/Grafts Angiography;  Surgeon: Sherren Mocha, MD;  Location: [facility];  Service: Cardiovascular;  Laterality: N/A;  . CATARACT EXTRACTION W/ INTRAOCULAR LENS  IMPLANT, BILATERAL Bilateral   . CHOLECYSTECTOMY OPEN  11/2003   [name]  . CORONARY ANGIOPLASTY WITH STENT PLACEMENT  01/03/2012   Successful DES to SVG-RCA and cutting balloon angioplasty ostial  PDA   . CORONARY ANGIOPLASTY WITH STENT PLACEMENT  01/14/2014   "1"  . CORONARY ARTERY BYPASS GRAFT  11/1999   CABG X5  . CORONARY STENT PLACEMENT  02/2012   1 stent and balloon  . ESOPHAGEAL DILATION  11/27/2018   Procedure: ESOPHAGEAL DILATION;  Surgeon: Irene Shipper, MD;  Location: [facility];  Service: Endoscopy;;  . ESOPHAGOGASTRODUODENOSCOPY N/A 03/01/2017   Procedure: ESOPHAGOGASTRODUODENOSCOPY (EGD);  Surgeon: Irene Shipper, MD;  Location: [facility];  Service: Endoscopy;  Laterality: N/A;  . ESOPHAGOGASTRODUODENOSCOPY (EGD) WITH ESOPHAGEAL DILATION  2010  . ESOPHAGOGASTRODUODENOSCOPY (EGD) WITH PROPOFOL N/A 09/12/2018   Procedure: ESOPHAGOGASTRODUODENOSCOPY (EGD) WITH PROPOFOL;  Surgeon: Irene Shipper, MD;  Location: [facility];  Service: Endoscopy;  Laterality: N/A;  . ESOPHAGOGASTRODUODENOSCOPY (EGD) WITH PROPOFOL N/A 11/27/2018   Procedure: ESOPHAGOGASTRODUODENOSCOPY (EGD)  WITH PROPOFOL, WITH BALLOON DILATION;  Surgeon: Irene Shipper, MD;  Location: [facility];  Service: Endoscopy;  Laterality: N/A;  . ESOPHAGOGASTRODUODENOSCOPY (EGD) WITH PROPOFOL N/A 02/20/2020   Procedure: ESOPHAGOGASTRODUODENOSCOPY (EGD) WITH PROPOFOL;  Surgeon: Irene Shipper, MD;  Location: [facility];  Service: Endoscopy;  Laterality: N/A;  . HAND SURGERY     bilateral carpal  tunnel releases  . JOINT REPLACEMENT    . KNEE ARTHROSCOPY Right 2008  . LEFT AND RIGHT HEART CATHETERIZATION WITH CORONARY ANGIOGRAM  12/31/2011   Procedure: LEFT AND RIGHT HEART CATHETERIZATION WITH CORONARY ANGIOGRAM;  Surgeon: Burnell Blanks, MD;  Location: [facility] C[facility] LAB;  Service: Cardiovascular;;  . LEFT AND RIGHT HEART CATHETERIZATION WITH CORONARY ANGIOGRAM N/A 01/14/2014   Procedure: LEFT AND RIGHT HEART CATHETERIZATION WITH CORONARY ANGIOGRAM;  Surgeon: Peter M Martinique, MD;  Location: [facility];  Service: Cardiovascular;  Laterality: N/A;  . MALONEY DILATION  03/01/2017   Procedure: Venia Minks DILATION;  Surgeon: Irene Shipper, MD;  Location: [facility];  Service: Endoscopy;;  . PERCUTANEOUS CORONARY INTERVENTION-BALLOON ONLY  01/03/2012   Procedure: PERCUTANEOUS CORONARY INTERVENTION-BALLOON ONLY;  Surgeon: Peter M Martinique, MD;  Location: [facility] CATH LAB;  Service: Cardiovascular;;  . PERCUTANEOUS CORONARY STENT INTERVENTION (PCI-S)  12/31/2011   Procedure: PERCUTANEOUS CORONARY STENT INTERVENTION (PCI-S);  Surgeon: Burnell Blanks, MD;  Location: [facility] CATH LAB;  Service: Cardiovascular;;  . PERCUTANEOUS CORONARY STENT INTERVENTION (PCI-S) N/A 01/03/2012   Procedure: PERCUTANEOUS CORONARY STENT INTERVENTION (PCI-S);  Surgeon: Peter M Martinique, MD;  Location: [facility] CATH LAB;  Service: Cardiovascular;  Laterality: N/A;  . SHOULDER ARTHROSCOPY WITH OPEN ROTATOR CUFF REPAIR AND DISTAL CLAVICLE ACROMINECTOMY Left 02/27/2013   Procedure: LEFT SHOULDER ARTHROSCOPY WITH MINI OPEN ROTATOR CUFF REPAIR AND SUBACROMIAL DECOMPRESSION AND DISTAL CLAVICLE RESECTION;  Surgeon: [name], MD;  Location: [facility];  Service: Orthopedics;  Laterality: Left;  . TOTAL KNEE ARTHROPLASTY Right 03/2010   [name]  . TRIGGER FINGER RELEASE Left 02/27/2013   Procedure: RELEASE TRIGGER FINGER/A-1 PULLEY;  Surgeon: [name], MD;  Location: [facility];  Service: Orthopedics;  Laterality: Left;     Allergies  Allergen Reactions  . Doxazosin Mesylate Other (See Comments)    dizziness  . Methocarbamol Rash    Immunization History  Administered Date(s) Administered  . H1N1 03/27/2008  . Influenza Split 12/29/2010, 01/18/2012  . Influenza Whole 02/03/2007, 01/09/2008, 01/01/2009, 12/31/2009  . Influenza,inj,Quad PF,6+ Mos 12/13/2012, 01/15/2014, 01/15/2015, 01/06/2016, 01/03/2017, 02/09/2018, 01/30/2019, 01/05/2020  . PFIZER SARS-COV-2 Vaccination 04/26/2019, 05/17/2019  . Pneumococcal Conjugate-13 09/10/2013  . Pneumococcal Polysaccharide-23 03/05/1997, 08/17/2011  . Td 03/05/1997, 08/17/2011    Family History  Problem Relation Age of Onset  . COPD Mother   . Heart disease Father   . Heart attack Father   . Stomach cancer Brother   . Stroke Sister   . Alcohol abuse Sister   . Colon cancer Brother 25  . Diabetes Brother   . Rectal cancer Neg Hx      Current Outpatient Medications:  .  amLODipine (NORVASC) 5 MG tablet, Take 5 mg by mouth daily. , Disp: , Rfl:  .  aspirin EC 81 MG tablet, Take 81 mg by mouth at bedtime., Disp: , Rfl:  .  Blood Glucose Monitoring Suppl (ONE TOUCH ULTRA 2) w/Device KIT, Use to obtain blood sugar daily. Dx Code E11.40, Disp: 1 kit, Rfl: 0 .  carvedilol (COREG) 6.25 MG tablet, Take 6.25 mg by mouth 2 (two) times daily., Disp: ,  Rfl:  .  clopidogrel (PLAVIX) 75 MG tablet, Take 1 tablet (75 mg total) by mouth daily., Disp: 90 tablet, Rfl: 3 .  DHA-Vitamin C-Lutein (EYE HEALTH FORMULA PO), Take 1 tablet by mouth daily. , Disp: , Rfl:  .  furosemide (LASIX) 40 MG tablet, Take 40 mg by mouth 4 (four) times a week. Monday, Wednesday, Friday & Sunday, Disp: , Rfl:  .  glucose blood (ONE TOUCH ULTRA TEST) test strip, USE TO CHECK BLOOD SUGAR ONCE A DAY Dx Code E11.40, Disp: 100 each, Rfl: 3 .  HYDROcodone-acetaminophen (NORCO) 10-325 MG tablet, Take 2 tablets by mouth 3 (three) times daily as needed for severe pain. Must last 30 days. Max 5  tablets/day, Disp: 150 tablet, Rfl: 0 .  [START ON 03/13/2020] HYDROcodone-acetaminophen (NORCO) 10-325 MG tablet, Take 2 tablets by mouth 3 (three) times daily as needed for severe pain. Must last 30 days. Max 5 tablets/day, Disp: 150 tablet, Rfl: 0 .  [START ON 04/12/2020] HYDROcodone-acetaminophen (NORCO) 10-325 MG tablet, Take 2 tablets by mouth 2 (two) times daily as needed for severe pain. Must last 30 days. Max 4 tablets/day (Patient taking differently: Take 2 tablets by mouth in the morning and at bedtime. Must last 30 days. Max 4 tablets/day), Disp: 120 tablet, Rfl: 0 .  losartan (COZAAR) 25 MG tablet, Take 25 mg by mouth at bedtime. , Disp: , Rfl:  .  nitroGLYCERIN (NITROSTAT) 0.4 MG SL tablet, DISSOLVE 1 TABLET UNDER TONGUE AS NEEDEDFOR CHEST PAIN. MAY REPEAT 5 MINUTES APART 3 TIMES IF NEEDED. IF NO RELIEF CALL 911 (Patient taking differently: Place 0.4 mg under the tongue every 5 (five) minutes x 3 doses as needed for chest pain. DISSOLVE 1 TABLET UNDER TONGUE AS NEEDEDFOR CHEST PAIN. MAY REPEAT 5 MINUTES APART 3 TIMES IF NEEDED. IF NO RELIEF CALL 911), Disp: 25 tablet, Rfl: 0 .  ondansetron (ZOFRAN) 4 MG tablet, TAKE 1 TABLET BY MOUTH EVERY 8 HOURS AS NEEDED FOR NAUSEA OR VOMITING (Patient taking differently: Take 4 mg by mouth every 8 (eight) hours as needed (nausea/vomiting). ), Disp: 30 tablet, Rfl: 1 .  OneTouch Delica Lancets 25E MISC, 1 each by In Vitro route daily. Dx Code E11.49, Disp: 100 each, Rfl: 3 .  OXYGEN, Inhale into the lungs. Per pt- Uses Oxygen 2 liters at night., Disp: , Rfl:  .  pantoprazole (PROTONIX) 40 MG tablet, Take 1 tablet (40 mg total) by mouth daily., Disp: 90 tablet, Rfl: 3 .  polyethylene glycol (MIRALAX / GLYCOLAX) 17 g packet, Take 51 g by mouth every other day. , Disp: , Rfl:  .  pregabalin (LYRICA) 25 MG capsule, Take 1 capsule (25 mg total) by mouth at bedtime for 20 days, THEN 2 capsules (50 mg total) at bedtime. (Patient taking differently: Take 1 capsule  (25 mg) by mouth at bedtime.), Disp: 160 capsule, Rfl: 0 .  Semaglutide 3 MG TABS, Take 3 mg by mouth daily before breakfast., Disp: 30 tablet, Rfl: 11 .  tamsulosin (FLOMAX) 0.4 MG CAPS capsule, Take 0.4 mg by mouth at bedtime. , Disp: , Rfl:       Objective:   Vitals:   02/26/20 1412  BP: 114/74  Pulse: 67  Temp: 98.4 F (36.9 C)  TempSrc: Oral  SpO2: 95%  Weight: 197 lb (89.4 kg)  Height: 6' (1.829 m)    Estimated body mass index is 26.72 kg/m as calculated from the following:   Height as of this encounter: 6' (1.829 m).  Weight as of this encounter: 197 lb (89.4 kg).  _0 @  Filed Weights   02/26/20 1412  Weight: 197 lb (89.4 kg)     Physical Exam  General: No distress. Looks well. [place] Neuro: Alert and Oriented x 3. GCS 15. Speech normal Psych: Pleasant Resp:  Barrel Chest - no.  Wheeze - no, Crackles - yes velcro at bae, No overt respiratory distress CVS: Normal heart sounds. Murmurs - no Ext: Stigmata of Connective Tissue Disease - no HEENT: Normal upper airway. PEERL +. No post nasal drip        Assessment:       ICD-10-CM   1. Dyspnea, unspecified type  R06.00 Pulmonary function test  2. Interstitial pulmonary disease ([name])  J84.9 Pulmonary function test  3. Vaccine counseling  Z71.85        Plan:     Patient Instructions     ICD-10-CM   1. Dyspnea, unspecified type  R06.00   2. Interstitial pulmonary disease ([place])  J84.9   3. Vaccine counseling  Z71.85     Your ung disease is extremely mild and it is stable in the last few years.   Plan [place] o2 2L  at night No role for antifibrotic Continue to stay physically fit and maintain all your health systems as optimally as you can Please get your covid booster asap  Followup -6-9 months get spirometry/dlco  - return for 15 min slot but after breathing test   - simple walk test and ILD symptom score at followup      SIGNATURE    Dr. Brand Males, M.D.,  F.C.C.P,  Pulmonary and Critical Care Medicine Staff Physician, [place] Director - Interstitial Lung Disease  Program  Pulmonary [place] at [place], [place], [address]  Pager: [phone], If no answer or between  15:00h - 7:00h: call [phone] Telephone: [phone]  2:41 PM 02/26/2020

## 2020-02-26 NOTE — Patient Instructions (Addendum)
ICD-10-CM   1. Dyspnea, unspecified type  R06.00   2. Interstitial pulmonary disease ([name])  J84.9   3. Vaccine counseling  Z71.85     Your ung disease is extremely mild and it is stable in the last few years.   Plan [place] o2 2L  at night No role for antifibrotic Continue to stay physically fit and maintain all your health systems as optimally as you can Please get your covid booster asap  Followup -6-9 months get spirometry/dlco  - return for 15 min slot but after breathing test   - simple walk test and ILD symptom score at followup

## 2020-03-17 DIAGNOSIS — R0689 Other abnormalities of breathing: Secondary | ICD-10-CM | POA: Diagnosis not present

## 2020-03-17 DIAGNOSIS — J8410 Pulmonary fibrosis, unspecified: Secondary | ICD-10-CM | POA: Diagnosis not present

## 2020-03-24 ENCOUNTER — Telehealth: Payer: Self-pay

## 2020-03-24 DIAGNOSIS — M47816 Spondylosis without myelopathy or radiculopathy, lumbar region: Secondary | ICD-10-CM

## 2020-03-24 DIAGNOSIS — G894 Chronic pain syndrome: Secondary | ICD-10-CM

## 2020-03-24 NOTE — Telephone Encounter (Signed)
Orders Placed This Encounter  Procedures  . LUMBAR FACET(MEDIAL BRANCH NERVE BLOCK) MBNB    Standing Status:   Future    Standing Expiration Date:   04/24/2020    Scheduling Instructions:     Procedure: Lumbar facet block (AKA.: Lumbosacral medial branch nerve block)     Side: Bilateral     Level: L3-4, L4-5, & L5-S1 Facets ( L3, L4, L5,  Medial Branch Nerves)     Sedation: Patient's choice.     Timeframe: ASAA     Stop Plavix 7 days prior    Order Specific Question:   Where will this procedure be performed?    Answer:   [facility]     Stop Plavix 7 days prior.

## 2020-03-24 NOTE — Telephone Encounter (Signed)
He is having back pain and wants to get a procedure. Does he need a virtual visit? He said he has had one shot and Dr. [name] told him to call back if he needed another one. What kind of procedure do I need to schedule if its ok to do so

## 2020-04-03 ENCOUNTER — Other Ambulatory Visit: Payer: Self-pay | Admitting: Student in an Organized Health Care Education/Training Program

## 2020-04-09 ENCOUNTER — Ambulatory Visit (HOSPITAL_BASED_OUTPATIENT_CLINIC_OR_DEPARTMENT_OTHER): Payer: Medicare Other | Admitting: Student in an Organized Health Care Education/Training Program

## 2020-04-09 ENCOUNTER — Ambulatory Visit: Payer: Medicare Other | Admitting: Student in an Organized Health Care Education/Training Program

## 2020-04-09 ENCOUNTER — Ambulatory Visit
Admission: RE | Admit: 2020-04-09 | Discharge: 2020-04-09 | Disposition: A | Discharge status: Home / Self Care | Payer: Medicare Other | Source: Ambulatory Visit | Attending: Student in an Organized Health Care Education/Training Program | Admitting: Student in an Organized Health Care Education/Training Program

## 2020-04-09 ENCOUNTER — Other Ambulatory Visit: Payer: Self-pay

## 2020-04-09 ENCOUNTER — Encounter: Payer: Self-pay | Admitting: Student in an Organized Health Care Education/Training Program

## 2020-04-09 VITALS — BP 189/88 | HR 78 | Temp 97.2°F | Resp 16 | Ht 72.0 in | Wt 201.0 lb

## 2020-04-09 DIAGNOSIS — M4696 Unspecified inflammatory spondylopathy, lumbar region: Secondary | ICD-10-CM | POA: Diagnosis not present

## 2020-04-09 DIAGNOSIS — M47816 Spondylosis without myelopathy or radiculopathy, lumbar region: Secondary | ICD-10-CM | POA: Insufficient documentation

## 2020-04-09 DIAGNOSIS — G894 Chronic pain syndrome: Secondary | ICD-10-CM | POA: Insufficient documentation

## 2020-04-09 MED ORDER — LIDOCAINE HCL 2 % IJ SOLN
20.0000 mL | Freq: Once | INTRAMUSCULAR | Status: AC
  Administered 2020-04-09: 400 mg

## 2020-04-09 MED ORDER — ROPIVACAINE HCL 2 MG/ML IJ SOLN
INTRAMUSCULAR | Status: AC
  Filled 2020-04-09: qty 20

## 2020-04-09 MED ORDER — DEXAMETHASONE SODIUM PHOSPHATE 10 MG/ML IJ SOLN
INTRAMUSCULAR | Status: AC
  Filled 2020-04-09: qty 2

## 2020-04-09 MED ORDER — LIDOCAINE HCL 2 % IJ SOLN
INTRAMUSCULAR | Status: AC
  Filled 2020-04-09: qty 20

## 2020-04-09 MED ORDER — DEXAMETHASONE SODIUM PHOSPHATE 10 MG/ML IJ SOLN
10.0000 mg | Freq: Once | INTRAMUSCULAR | Status: AC
  Administered 2020-04-09: 10 mg

## 2020-04-09 MED ORDER — ROPIVACAINE HCL 2 MG/ML IJ SOLN: 9.0000 mL | Freq: Once | INTRAMUSCULAR | Status: DC

## 2020-04-09 MED ORDER — SODIUM CHLORIDE 0.9% FLUSH: 1.0000 mL | Freq: Once | INTRAVENOUS | Status: DC

## 2020-04-09 NOTE — Progress Notes (Signed)
PROVIDER NOTE: Information contained herein reflects review and annotations entered in association with encounter. Interpretation of such information and data should be left to medically-trained personnel. Information provided to patient can be located elsewhere in the medical record under "Patient Instructions". Document created using STT-dictation technology, any transcriptional errors that may result from process are unintentional.    Patient: [name]  Service Category: Procedure  Provider: Gillis Santa, MD  DOB: [birth year]  DOS: 04/09/2020  Location: [facility]  MRN: [id number]  Setting: Ambulatory - outpatient  Referring Provider: [name], MD  Type: Established Patient  Specialty: Interventional Pain Management  PCP: [name], MD   Primary Reason for Visit: Interventional Pain Management Treatment. CC: Back Pain  Procedure:          Anesthesia, Analgesia, Anxiolysis:  Type: Lumbar Facet, Medial Branch Block(s) #2  Primary Purpose: Therapeutic Region: Posterolateral Lumbosacral Spine Level:  L3, L4, L5,  Medial Branch Level(s). Injecting these levels blocks the L3-4, L4-5 lumbar facet joints. Laterality: Right  Type: Local Anesthesia  Local Anesthetic: Lidocaine 1-2%  Position: Prone   Indications: 1. Lumbar facet arthropathy   2. Lumbar spondylosis   3. Chronic pain syndrome    Pain Score: Pre-procedure: 3 /10 Post-procedure: 0-No pain (moving/standing)/10   Last dose of Plavix 04/03/2020  Pre-op Assessment:  [name] is a 87 y.o. (year old), male patient, seen today for interventional treatment. He  has a past surgical history that includes Total knee arthroplasty (Right, 03/2010); Knee arthroscopy (Right, 2008); Cataract extraction w/ intraocular lens  implant, bilateral (Bilateral); Coronary artery bypass graft (11/1999); Coronary stent placement (02/2012); Shoulder arthroscopy with open rotator cuff repair and distal clavicle  acrominectomy (Left, 02/27/2013); Trigger finger release (Left, 02/27/2013); Cholecystectomy open (11/2003); Joint replacement; Esophagogastroduodenoscopy (egd) with esophageal dilation (2010); Cardiac catheterization (08/2004); Cardiac catheterization (12/31/2011); Cardiac catheterization (2009); left and right heart catheterization with coronary angiogram (12/31/2011); percutaneous coronary stent intervention (pci-s) (12/31/2011); percutaneous coronary stent intervention (pci-s) (N/A, 01/03/2012); Percutaneous coronary intervention-balloon only (01/03/2012); left and right heart catheterization with coronary angiogram (N/A, 01/14/2014); Hand surgery; Cardiac catheterization (N/A, 08/13/2015); Esophagogastroduodenoscopy (N/A, 03/01/2017); maloney dilation (03/01/2017); Esophagogastroduodenoscopy (egd) with propofol (N/A, 09/12/2018); Botox injection (N/A, 09/12/2018); Balloon dilation (N/A, 09/12/2018); Coronary angioplasty with stent (01/03/2012); Coronary angioplasty with stent (01/14/2014); Esophagogastroduodenoscopy (egd) with propofol (N/A, 11/27/2018); Botox injection (N/A, 11/27/2018); Esophageal dilation (11/27/2018); Esophagogastroduodenoscopy (egd) with propofol (N/A, 02/20/2020); Botox injection (N/A, 02/20/2020); and Balloon dilation (N/A, 02/20/2020). [name] has a current medication list which includes the following prescription(s): amlodipine, one touch ultra 2, carvedilol, dha-vitamin c-lutein, furosemide, glucose blood, hydrocodone-acetaminophen, [START ON 04/12/2020] hydrocodone-acetaminophen, linaclotide, losartan, nitroglycerin, ondansetron, onetouch delica lancets 21H, oxygen-helium, pantoprazole, polyethylene glycol, pregabalin, semaglutide, tamsulosin, aspirin ec, and clopidogrel, and the following Facility-Administered Medications: ropivacaine (pf) 2 mg/ml (0.2%) and sodium chloride flush. His primarily concern today is the Back Pain  Initial Vital Signs:  Pulse/HCG Rate: 75  Temp: (!) 97.2 F (36.2  C) Resp: 18 BP: (!) 166/76 SpO2: 98 %  BMI: Estimated body mass index is 27.26 kg/m as calculated from the following:   Height as of this encounter: 6' (1.829 m).   Weight as of this encounter: 201 lb (91.2 kg).  Risk Assessment: Allergies: Reviewed. He is allergic to doxazosin mesylate and methocarbamol.  Allergy Precautions: None required Coagulopathies: Reviewed. None identified.  Blood-thinner therapy: None at this time Active Infection(s): Reviewed. None identified. [name] is afebrile  Site Confirmation: [name] was asked to confirm the procedure and  laterality before marking the site Procedure checklist: Completed Consent: Before the procedure and under the influence of no sedative(s), amnesic(s), or anxiolytics, the patient was informed of the treatment options, risks and possible complications. To fulfill our ethical and legal obligations, as recommended by the American Medical Association's Code of Ethics, I have informed the patient of my clinical impression; the nature and purpose of the treatment or procedure; the risks, benefits, and possible complications of the intervention; the alternatives, including doing nothing; the risk(s) and benefit(s) of the alternative treatment(s) or procedure(s); and the risk(s) and benefit(s) of doing nothing. The patient was provided information about the general risks and possible complications associated with the procedure. These may include, but are not limited to: failure to achieve desired goals, infection, bleeding, organ or nerve damage, allergic reactions, paralysis, and death. In addition, the patient was informed of those risks and complications associated to Spine-related procedures, such as failure to decrease pain; infection (i.e.: Meningitis, epidural or intraspinal abscess); bleeding (i.e.: epidural hematoma, subarachnoid hemorrhage, or any other type of intraspinal or peri-dural bleeding); organ or nerve damage (i.e.: Any  type of peripheral nerve, nerve root, or spinal cord injury) with subsequent damage to sensory, motor, and/or autonomic systems, resulting in permanent pain, numbness, and/or weakness of one or several areas of the body; allergic reactions; (i.e.: anaphylactic reaction); and/or death. Furthermore, the patient was informed of those risks and complications associated with the medications. These include, but are not limited to: allergic reactions (i.e.: anaphylactic or anaphylactoid reaction(s)); adrenal axis suppression; blood sugar elevation that in diabetics may result in ketoacidosis or comma; water retention that in patients with history of congestive heart failure may result in shortness of breath, pulmonary edema, and decompensation with resultant heart failure; weight gain; swelling or edema; medication-induced neural toxicity; particulate matter embolism and blood vessel occlusion with resultant organ, and/or nervous system infarction; and/or aseptic necrosis of one or more joints. Finally, the patient was informed that Medicine is not an exact science; therefore, there is also the possibility of unforeseen or unpredictable risks and/or possible complications that may result in a catastrophic outcome. The patient indicated having understood very clearly. We have given the patient no guarantees and we have made no promises. Enough time was given to the patient to ask questions, all of which were answered to the patient's satisfaction. Mr. Myler has indicated that he wanted to continue with the procedure. Attestation: I, the ordering provider, attest that I have discussed with the patient the benefits, risks, side-effects, alternatives, likelihood of achieving goals, and potential problems during recovery for the procedure that I have provided informed consent. Date  Time: 04/09/2020 11:27 AM  Pre-Procedure Preparation:  Monitoring: As per clinic protocol. Respiration, ETCO2, SpO2, BP, heart rate and  rhythm monitor placed and checked for adequate function Safety Precautions: Patient was assessed for positional comfort and pressure points before starting the procedure. Time-out: I initiated and conducted the "Time-out" before starting the procedure, as per protocol. The patient was asked to participate by confirming the accuracy of the "Time Out" information. Verification of the correct person, site, and procedure were performed and confirmed by me, the nursing staff, and the patient. "Time-out" conducted as per Joint Commission's Universal Protocol (UP.01.01.01). Time: 1152  Description of Procedure:          Laterality: Right Levels:  L3, L4, L5, Medial Branch Level(s) Area Prepped: Posterior Lumbosacral Region DuraPrep (Iodine Povacrylex [0.7% available iodine] and Isopropyl Alcohol, 74% w/w) Safety Precautions: Aspiration looking for  blood return was conducted prior to all injections. At no point did we inject any substances, as a needle was being advanced. Before injecting, the patient was told to immediately notify me if he was experiencing any new onset of "ringing in the ears, or metallic taste in the mouth". No attempts were made at seeking any paresthesias. Safe injection practices and needle disposal techniques used. Medications properly checked for expiration dates. SDV (single dose vial) medications used. After the completion of the procedure, all disposable equipment used was discarded in the proper designated medical waste containers. Local Anesthesia: Protocol guidelines were followed. The patient was positioned over the fluoroscopy table. The area was prepped in the usual manner. The time-out was completed. The target area was identified using fluoroscopy. A 12-in long, straight, sterile hemostat was used with fluoroscopic guidance to locate the targets for each level blocked. Once located, the skin was marked with an approved surgical skin marker. Once all sites were marked, the skin  (epidermis, dermis, and hypodermis), as well as deeper tissues (fat, connective tissue and muscle) were infiltrated with a small amount of a short-acting local anesthetic, loaded on a 10cc syringe with a 25G, 1.5-in  Needle. An appropriate amount of time was allowed for local anesthetics to take effect before proceeding to the next step. Local Anesthetic: Lidocaine 2.0% The unused portion of the local anesthetic was discarded in the proper designated containers. Technical explanation of process:   L3 Medial Branch Nerve Block (MBB): The target area for the L3 medial branch is at the junction of the postero-lateral aspect of the superior articular process and the superior, posterior, and medial edge of the transverse process of L4. Under fluoroscopic guidance, a Quincke needle was inserted until contact was made with os over the superior postero-lateral aspect of the pedicular shadow (target area). After negative aspiration for blood, 59mL of the nerve block solution was injected without difficulty or complication. The needle was removed intact. L4 Medial Branch Nerve Block (MBB): The target area for the L4 medial branch is at the junction of the postero-lateral aspect of the superior articular process and the superior, posterior, and medial edge of the transverse process of L5. Under fluoroscopic guidance, a Quincke needle was inserted until contact was made with os over the superior postero-lateral aspect of the pedicular shadow (target area). After negative aspiration for blood, 2 mL of the nerve block solution was injected without difficulty or complication. The needle was removed intact. L5 Medial Branch Nerve Block (MBB): The target area for the L5 medial branch is at the junction of the postero-lateral aspect of the superior articular process and the superior, posterior, and medial edge of the sacral ala. Under fluoroscopic guidance, a Quincke needle was inserted until contact was made with os over the  superior postero-lateral aspect of the pedicular shadow (target area). After negative aspiration for blood, 36mL of the nerve block solution was injected without difficulty or complication. The needle was removed intact.  Nerve block solution: 8 cc solution made of 6 cc of 0.2% ropivacaine, 2 cc of Decadron 10 mg/cc.  2 cc injected at each level above on the right.  The unused portion of the solution was discarded in the proper designated containers. Procedural Needles: 22-gauge, 3.5-inch, Quincke needles used for all levels.  Once the entire procedure was completed, the treated area was cleaned, making sure to leave some of the prepping solution back to take advantage of its long term bactericidal properties.   Illustration of the  posterior view of the lumbar spine and the posterior neural structures. Laminae of L2 through S1 are labeled. DPRL5, dorsal primary ramus of L5; DPRS1, dorsal primary ramus of S1; DPR3, dorsal primary ramus of L3; FJ, facet (zygapophyseal) joint L3-L4; I, inferior articular process of L4; LB1, lateral branch of dorsal primary ramus of L1; IAB, inferior articular branches from L3 medial branch (supplies L4-L5 facet joint); IBP, intermediate branch plexus; MB3, medial branch of dorsal primary ramus of L3; NR3, third lumbar nerve root; S, superior articular process of L5; SAB, superior articular branches from L4 (supplies L4-5 facet joint also); TP3, transverse process of L3.  Vitals:   04/09/20 1130 04/09/20 1148 04/09/20 1150 04/09/20 1200  BP: (!) 166/76  (!) 193/89 (!) 189/88  Pulse: 75 80 80 78  Resp: 18  19 16   Temp: (!) 97.2 F (36.2 C)     TempSrc: Temporal     SpO2: 98%  97% 96%  Weight: 201 lb (91.2 kg)     Height: 6' (1.829 m)        Start Time: 1153 hrs. End Time:   hrs.  Imaging Guidance (Spinal):          Type of Imaging Technique: Fluoroscopy Guidance (Spinal) Indication(s): Assistance in needle guidance and placement for procedures requiring needle  placement in or near specific anatomical locations not easily accessible without such assistance. Exposure Time: Please see nurses notes. Contrast: None used. Fluoroscopic Guidance: I was personally present during the use of fluoroscopy. "Tunnel Vision Technique" used to obtain the best possible view of the target area. Parallax error corrected before commencing the procedure. "Direction-depth-direction" technique used to introduce the needle under continuous pulsed fluoroscopy. Once target was reached, antero-posterior, oblique, and lateral fluoroscopic projection used confirm needle placement in all planes. Images permanently stored in EMR. Interpretation: No contrast injected. I personally interpreted the imaging intraoperatively. Adequate needle placement confirmed in multiple planes. Permanent images saved into the patient's record.  Antibiotic Prophylaxis:   Anti-infectives (From admission, onward)   None     Indication(s): None identified  Post-operative Assessment:  Post-procedure Vital Signs:  Pulse/HCG Rate: 78 (nsr)  Temp: (!) 97.2 F (36.2 C) Resp: 16 BP: (!) 189/88 SpO2: 96 %  EBL: None  Complications: No immediate post-treatment complications observed by team, or reported by patient.  Note: The patient tolerated the entire procedure well. A repeat set of vitals were taken after the procedure and the patient was kept under observation following institutional policy, for this type of procedure. Post-procedural neurological assessment was performed, showing return to baseline, prior to discharge. The patient was provided with post-procedure discharge instructions, including a section on how to identify potential problems. Should any problems arise concerning this procedure, the patient was given instructions to immediately contact us, at any time, without hesitation. In any case, we plan to contact the patient by telephone for a follow-up status report regarding this  interventional procedure.  Comments:  No additional relevant information.  Plan of Care  Orders:  Orders Placed This Encounter  Procedures  . [facility] C-ARM 1-60 MIN NO REPORT    Intraoperative interpretation by procedural physician at [place].    Standing Status:   Standing    Number of Occurrences:   1    Order Specific Question:   Reason for exam:    Answer:   Assistance in needle guidance and placement for procedures requiring needle placement in or near specific anatomical locations not easily accessible without  such assistance.   Medications ordered for procedure: Meds ordered this encounter  Medications  . lidocaine (XYLOCAINE) 2 % (with pres) injection 400 mg  . sodium chloride flush (NS) 0.9 % injection 1 mL  . dexamethasone (DECADRON) injection 10 mg  . ropivacaine (PF) 2 mg/mL (0.2%) (NAROPIN) injection 9 mL   Medications administered: We administered lidocaine and dexamethasone.  See the medical record for exact dosing, route, and time of administration.  Follow-up plan:   Return for Keep sch. appt.    Recent Visits Date Type Provider Dept  02/12/20 Office Visit Gillis Santa, MD [facility]  Showing recent visits within past 90 days and meeting all other requirements Today's Visits Date Type Provider Dept  04/09/20 Procedure visit Gillis Santa, MD [facility]  Showing today's visits and meeting all other requirements Future Appointments Date Type Provider Dept  05/13/20 Appointment Gillis Santa, MD [facility]  Showing future appointments within next 90 days and meeting all other requirements  Disposition: Discharge home  Discharge (Date  Time): 04/09/2020; 1204 hrs.   Primary Care Physician: [name], MD Location: [facility] Note by: Gillis Santa, MD Date: 04/09/2020; Time: 12:19 PM  Disclaimer:  Medicine is not an exact science. The only guarantee in medicine is  that nothing is guaranteed. It is important to note that the decision to proceed with this intervention was based on the information collected from the patient. The Data and conclusions were drawn from the patient's questionnaire, the interview, and the physical examination. Because the information was provided in large part by the patient, it cannot be guaranteed that it has not been purposely or unconsciously manipulated. Every effort has been made to obtain as much relevant data as possible for this evaluation. It is important to note that the conclusions that lead to this procedure are derived in large part from the available data. Always take into account that the treatment will also be dependent on availability of resources and existing treatment guidelines, considered by other Pain Management Practitioners as being common knowledge and practice, at the time of the intervention. For Medico-Legal purposes, it is also important to point out that variation in procedural techniques and pharmacological choices are the acceptable norm. The indications, contraindications, technique, and results of the above procedure should only be interpreted and judged by a Board-Certified Interventional Pain Specialist with extensive familiarity and expertise in the same exact procedure and technique.

## 2020-04-09 NOTE — Patient Instructions (Signed)

## 2020-04-09 NOTE — Progress Notes (Signed)
Safety precautions to be maintained throughout the outpatient stay will include: orient to surroundings, keep bed in low position, maintain call bell within reach at all times, provide assistance with transfer out of bed and ambulation.  

## 2020-04-10 ENCOUNTER — Encounter (HOSPITAL_COMMUNITY): Payer: Self-pay | Admitting: Emergency Medicine

## 2020-04-10 ENCOUNTER — Telehealth: Payer: Self-pay | Admitting: Cardiovascular Disease

## 2020-04-10 ENCOUNTER — Inpatient Hospital Stay (HOSPITAL_COMMUNITY)
Admission: EM | Admit: 2020-04-10 | Discharge: 2020-04-13 | DRG: 303 | Disposition: A | Discharge status: Home Health | Payer: Medicare Other | Attending: Internal Medicine | Admitting: Internal Medicine

## 2020-04-10 ENCOUNTER — Telehealth: Payer: Self-pay | Admitting: *Deleted

## 2020-04-10 ENCOUNTER — Emergency Department (HOSPITAL_COMMUNITY): Payer: Medicare Other

## 2020-04-10 ENCOUNTER — Ambulatory Visit: Payer: Medicare Other | Admitting: Podiatry

## 2020-04-10 ENCOUNTER — Telehealth: Payer: Self-pay | Admitting: Student in an Organized Health Care Education/Training Program

## 2020-04-10 ENCOUNTER — Other Ambulatory Visit: Payer: Self-pay | Admitting: Internal Medicine

## 2020-04-10 DIAGNOSIS — I10 Essential (primary) hypertension: Secondary | ICD-10-CM | POA: Diagnosis not present

## 2020-04-10 DIAGNOSIS — Z955 Presence of coronary angioplasty implant and graft: Secondary | ICD-10-CM | POA: Diagnosis not present

## 2020-04-10 DIAGNOSIS — R072 Precordial pain: Secondary | ICD-10-CM | POA: Diagnosis not present

## 2020-04-10 DIAGNOSIS — M069 Rheumatoid arthritis, unspecified: Secondary | ICD-10-CM | POA: Diagnosis present

## 2020-04-10 DIAGNOSIS — M810 Age-related osteoporosis without current pathological fracture: Secondary | ICD-10-CM | POA: Diagnosis present

## 2020-04-10 DIAGNOSIS — R079 Chest pain, unspecified: Secondary | ICD-10-CM | POA: Diagnosis not present

## 2020-04-10 DIAGNOSIS — R0902 Hypoxemia: Secondary | ICD-10-CM | POA: Diagnosis not present

## 2020-04-10 DIAGNOSIS — I472 Ventricular tachycardia: Secondary | ICD-10-CM | POA: Diagnosis not present

## 2020-04-10 DIAGNOSIS — N400 Enlarged prostate without lower urinary tract symptoms: Secondary | ICD-10-CM | POA: Diagnosis present

## 2020-04-10 DIAGNOSIS — J849 Interstitial pulmonary disease, unspecified: Secondary | ICD-10-CM | POA: Diagnosis not present

## 2020-04-10 DIAGNOSIS — N1832 Chronic kidney disease, stage 3b: Secondary | ICD-10-CM | POA: Diagnosis present

## 2020-04-10 DIAGNOSIS — K219 Gastro-esophageal reflux disease without esophagitis: Secondary | ICD-10-CM | POA: Diagnosis present

## 2020-04-10 DIAGNOSIS — I252 Old myocardial infarction: Secondary | ICD-10-CM | POA: Diagnosis not present

## 2020-04-10 DIAGNOSIS — Z7982 Long term (current) use of aspirin: Secondary | ICD-10-CM | POA: Diagnosis not present

## 2020-04-10 DIAGNOSIS — Z87891 Personal history of nicotine dependence: Secondary | ICD-10-CM

## 2020-04-10 DIAGNOSIS — Z79899 Other long term (current) drug therapy: Secondary | ICD-10-CM

## 2020-04-10 DIAGNOSIS — E1165 Type 2 diabetes mellitus with hyperglycemia: Secondary | ICD-10-CM | POA: Diagnosis present

## 2020-04-10 DIAGNOSIS — Z9981 Dependence on supplemental oxygen: Secondary | ICD-10-CM

## 2020-04-10 DIAGNOSIS — E1142 Type 2 diabetes mellitus with diabetic polyneuropathy: Secondary | ICD-10-CM | POA: Diagnosis not present

## 2020-04-10 DIAGNOSIS — G4733 Obstructive sleep apnea (adult) (pediatric): Secondary | ICD-10-CM | POA: Diagnosis present

## 2020-04-10 DIAGNOSIS — I130 Hypertensive heart and chronic kidney disease with heart failure and stage 1 through stage 4 chronic kidney disease, or unspecified chronic kidney disease: Secondary | ICD-10-CM | POA: Diagnosis present

## 2020-04-10 DIAGNOSIS — E1149 Type 2 diabetes mellitus with other diabetic neurological complication: Secondary | ICD-10-CM | POA: Diagnosis present

## 2020-04-10 DIAGNOSIS — Z96651 Presence of right artificial knee joint: Secondary | ICD-10-CM | POA: Diagnosis present

## 2020-04-10 DIAGNOSIS — I2582 Chronic total occlusion of coronary artery: Secondary | ICD-10-CM | POA: Diagnosis present

## 2020-04-10 DIAGNOSIS — E1122 Type 2 diabetes mellitus with diabetic chronic kidney disease: Secondary | ICD-10-CM | POA: Diagnosis present

## 2020-04-10 DIAGNOSIS — R0602 Shortness of breath: Secondary | ICD-10-CM | POA: Diagnosis not present

## 2020-04-10 DIAGNOSIS — I5032 Chronic diastolic (congestive) heart failure: Secondary | ICD-10-CM | POA: Diagnosis not present

## 2020-04-10 DIAGNOSIS — E119 Type 2 diabetes mellitus without complications: Secondary | ICD-10-CM | POA: Diagnosis not present

## 2020-04-10 DIAGNOSIS — E781 Pure hyperglyceridemia: Secondary | ICD-10-CM | POA: Diagnosis present

## 2020-04-10 DIAGNOSIS — E1140 Type 2 diabetes mellitus with diabetic neuropathy, unspecified: Secondary | ICD-10-CM | POA: Diagnosis not present

## 2020-04-10 DIAGNOSIS — N184 Chronic kidney disease, stage 4 (severe): Secondary | ICD-10-CM | POA: Diagnosis present

## 2020-04-10 DIAGNOSIS — Z8616 Personal history of COVID-19: Secondary | ICD-10-CM | POA: Diagnosis not present

## 2020-04-10 DIAGNOSIS — I25119 Atherosclerotic heart disease of native coronary artery with unspecified angina pectoris: Secondary | ICD-10-CM | POA: Diagnosis not present

## 2020-04-10 DIAGNOSIS — N179 Acute kidney failure, unspecified: Secondary | ICD-10-CM | POA: Diagnosis present

## 2020-04-10 DIAGNOSIS — E7849 Other hyperlipidemia: Secondary | ICD-10-CM | POA: Diagnosis present

## 2020-04-10 DIAGNOSIS — T380X5A Adverse effect of glucocorticoids and synthetic analogues, initial encounter: Secondary | ICD-10-CM | POA: Diagnosis present

## 2020-04-10 DIAGNOSIS — K220 Achalasia of cardia: Secondary | ICD-10-CM | POA: Diagnosis present

## 2020-04-10 DIAGNOSIS — Z8249 Family history of ischemic heart disease and other diseases of the circulatory system: Secondary | ICD-10-CM | POA: Diagnosis not present

## 2020-04-10 DIAGNOSIS — R0789 Other chest pain: Secondary | ICD-10-CM | POA: Diagnosis not present

## 2020-04-10 DIAGNOSIS — I350 Nonrheumatic aortic (valve) stenosis: Secondary | ICD-10-CM | POA: Diagnosis present

## 2020-04-10 DIAGNOSIS — Z7902 Long term (current) use of antithrombotics/antiplatelets: Secondary | ICD-10-CM

## 2020-04-10 DIAGNOSIS — D72828 Other elevated white blood cell count: Secondary | ICD-10-CM | POA: Diagnosis present

## 2020-04-10 DIAGNOSIS — I5022 Chronic systolic (congestive) heart failure: Secondary | ICD-10-CM | POA: Diagnosis present

## 2020-04-10 DIAGNOSIS — I5030 Unspecified diastolic (congestive) heart failure: Secondary | ICD-10-CM | POA: Diagnosis present

## 2020-04-10 LAB — BASIC METABOLIC PANEL
Anion gap: 15 (ref 5–15)
BUN: 45 mg/dL — ABNORMAL HIGH (ref 8–23)
CO2: 21 mmol/L — ABNORMAL LOW (ref 22–32)
Calcium: 9.8 mg/dL (ref 8.9–10.3)
Chloride: 99 mmol/L (ref 98–111)
Creatinine, Ser: 2.33 mg/dL — ABNORMAL HIGH (ref 0.61–1.24)
GFR, Estimated: 27 mL/min — ABNORMAL LOW (ref >60)
Glucose, Bld: 588 mg/dL (ref 70–99)
Potassium: 4.5 mmol/L (ref 3.5–5.1)
Sodium: 135 mmol/L (ref 135–145)

## 2020-04-10 LAB — CBC
HCT: 40.6 % (ref 39.0–52.0)
Hemoglobin: 13.3 g/dL (ref 13.0–17.0)
MCH: 28.5 pg (ref 26.0–34.0)
MCHC: 32.8 g/dL (ref 30.0–36.0)
MCV: 87.1 fL (ref 80.0–100.0)
Platelets: 191 10*3/uL (ref 150–400)
RBC: 4.66 MIL/uL (ref 4.22–5.81)
RDW: 13.2 % (ref 11.5–15.5)
WBC: 18.9 10*3/uL — ABNORMAL HIGH (ref 4.0–10.5)
nRBC: 0 % (ref 0.0–0.2)

## 2020-04-10 LAB — TROPONIN I (HIGH SENSITIVITY)
Troponin I (High Sensitivity): 29 ng/L — ABNORMAL HIGH (ref <18)
Troponin I (High Sensitivity): 37 ng/L — ABNORMAL HIGH (ref <18)
Troponin I (High Sensitivity): 49 ng/L — ABNORMAL HIGH (ref <18)

## 2020-04-10 LAB — CBG MONITORING, ED
Glucose-Capillary: 350 mg/dL — ABNORMAL HIGH (ref 70–99)
Glucose-Capillary: 313 mg/dL — ABNORMAL HIGH (ref 70–99)

## 2020-04-10 MED ORDER — FAMOTIDINE 20 MG PO TABS
20.0000 mg | ORAL_TABLET | Freq: Once | ORAL | Status: AC
  Administered 2020-04-10: 20 mg via ORAL
  Filled 2020-04-10: qty 1

## 2020-04-10 MED ORDER — CARVEDILOL 12.5 MG PO TABS
6.2500 mg | ORAL_TABLET | Freq: Once | ORAL | Status: AC
  Administered 2020-04-10: 6.25 mg via ORAL
  Filled 2020-04-10: qty 2

## 2020-04-10 MED ORDER — ALUM & MAG HYDROXIDE-SIMETH 200-200-20 MG/5ML PO SUSP
15.0000 mL | Freq: Once | ORAL | Status: AC
  Administered 2020-04-10: 15 mL via ORAL
  Filled 2020-04-10: qty 30

## 2020-04-10 MED ORDER — INSULIN ASPART 100 UNIT/ML ~~LOC~~ SOLN
8.0000 [IU] | Freq: Once | SUBCUTANEOUS | Status: AC
  Administered 2020-04-10: 8 [IU] via SUBCUTANEOUS

## 2020-04-10 NOTE — Telephone Encounter (Signed)
I spoke with [name] daughter, informed her that there is nothing related to the facet block that would cause chest pain. I advised her take him to the ED, given that the chest pain was relieved by NTG.

## 2020-04-10 NOTE — Telephone Encounter (Signed)
Pt c/o of Chest Pain: STAT if CP now or developed within 24 hours  1. Are you having CP right now? Yes  2. Are you experiencing any other symptoms (ex. SOB, nausea, vomiting, sweating)? Dizziness   3. How long have you been experiencing CP? Started last night   4. Is your CP continuous or coming and going? Continuous, but not hurting the same now   5. Have you taken Nitroglycerin? Took 2 prior to EMT's arriving  ?  EMT came out and said everything seems fine gave him Aspirin and left after he was feeling better advising him to call. Daughter states CP has came back and the EMT's have only been gone for 10 minutes.

## 2020-04-10 NOTE — ED Triage Notes (Signed)
Pt reports chest pain and sob since last night, took two nitro last night that were old, took 2 more newer nitro this morning which helped his pain. Denies any swelling to legs, states urinating a little less than normal despite lasix QID. A/ox4, nad.

## 2020-04-10 NOTE — Telephone Encounter (Signed)
Pts daughter calling in as a stat call. She states pt was having continuous CP, took 2 expired Nitro's with no relief. He called EMS who gave him ASA which relieved pain for a short while. His daughter picked up a new prescription of nitro which he has not taken as of yet. The last dose of nitro he took was > 1 hour ago.  I advised pts daughter to give him one SL Nitro. If CP is not relieved w/in 61min, take a second. If pt continues to experience CP or it returns he may take a 3rd nitro tab, call 911 and report to the ED for evaluation.   Will forward to Dr. Johnsie Cancel and his RN for follow up and recommendation. Pts daughter understands his RN will call to follow up later today.

## 2020-04-10 NOTE — Telephone Encounter (Signed)
Patient had procedure yesterday. During the night he started having some pain in his chest and had to take nitro-glyrecin, it eased up, later again had like acid reflux, took some meds and it eased up, again had chest pain and they called ambulance but everything checked out. Could meds from procedure cause this? Sent call to [facility] to speak with them

## 2020-04-10 NOTE — ED Provider Notes (Addendum)
[facility] EMERGENCY DEPARTMENT Provider Note   CSN: [id number] Arrival date & time: 04/10/20  1409     History Chief Complaint  Patient presents with  . Chest Pain    [name] is a 87 y.o. male.  The history is provided by the patient and medical records.  Chest Pain  [name] is a 87 y.o. male who presents to the Emergency Department complaining of chest pain. He presents the emergency department complaining of central chest pain that began last night. He had one episode last night and a second one today. He did eat a hot dog last night. He had a steroid injection in his back yesterday. EMS was called and he took four aspirin and took nitroglycerin in his pain has been completely resolved. The episode of pain was very severe today and lasted about one hour. He denies any associated diaphoresis, shortness of breath, nausea, vomiting, abdominal pain, leg swelling or pain. At the time of ED assessment he is requesting discharge.  Incidentally he did have an injection yesterday for his chronic back pain with steroids.    Past Medical History:  Diagnosis Date  . Arthritis    osteoarthritis, s/p R TKR, and digits  . CAD (coronary artery disease)    a. s/p CABG (2001)  b. s/p DES to RCA and cutting POBA to ostial PDA (2013)   c. s/p DES to SVG to OM2 (01/14/14) d. cath: 08/2015 NSTEMI w/ patent LIMA-LAD and 99% stenosis of SVG-OM w/ DES placed. CTO of SVG-RCA and SVG-D1.   . Cataract   . Chronic diastolic CHF (congestive heart failure) ([place])    a) 09/13 ECHO- LVEF 23-55%, grade 1 diastolic dysfunction, mild LA dilatation, atrial septal aneurysm, AV mobility restricted, but no sig AS by doppler; b) 09/04/08 ECHO- LVH, ef 60%, mild AS, c. echo 08/2015: EF perserved of 55-60% with inferolateral HK. Mild AS noted.  . Chronic kidney disease, stage III (moderate) ([place])   . Chronic lower back pain   . Colon polyps   . COVID-19   . Diverticulosis   . Dyspnea  2009 since July -Sept   05/06/08-CPST-  normal effort, reduced VO2 max 20.5 /65%, reduced at 8.2/ 40%, normal breathing resetvca of 55%, submaximal heart rate response 112/77%, flattened o2 pluse response at peak exercise-12 ml/beat @ 85%, No VQ mismatch abnormalities, All c/w CIRC Limitation  . Enlarged prostate   . Esophageal stricture    a. s/p dilation spring 2010  . GERD (gastroesophageal reflux disease)   . Heart murmur   . Hiatal hernia   . History of carpal tunnel syndrome    Bilateral  . History of kidney stones   . History of PFTs    mixed pattern on spiro. mild restn on lung volumes with near normal DLCO. Pattern can be explained by CABG scar. Fev1 2.2L/73%, ratio 68 (67), TLC 4.7/68%,RV 1.5L/55%,DLCO 79%  . Hyperlipidemia   . Hypertension   . Interstitial lung disease ([name])    NOS  . Iron deficiency anemia   . Nausea & vomiting    2018/2019  . On home oxygen therapy    2 L [place] at bedtime  . Osteoporosis   . Overweight (BMI 25.0-29.9)    BMI 29  . Peripheral neuropathy   . RA (rheumatoid arthritis) ([name])    [name]  . Seropositive rheumatoid arthritis ([place])   . Type II diabetes mellitus ([name])    diet controlled  . Wears  glasses   . Wears partial dentures    upper    Patient Active Problem List   Diagnosis Date Noted  . Chronic narcotic dependence ([place]) 12/14/2019  . Callus of foot 11/08/2019  . Plantar flexed metatarsal bone of left foot 11/08/2019  . COVID-19 virus infection 04/12/2019  . Long term prescription opiate use 12/14/2018  . Neuropathy 12/14/2018  . CKD stage 3 due to type 2 diabetes mellitus ([place]) 12/14/2018  . Problems with swallowing and mastication   . Achalasia   . Right inguinal hernia 07/04/2018  . Advance directive discussed with patient 04/28/2018  . Chronic pain of both lower extremities 10/26/2017  . Neuropathic pain 10/26/2017  . Chronic pain syndrome 10/26/2017  . Iron deficiency anemia 07/05/2017  . Constipation 07/05/2017   . CKD (chronic kidney disease) stage 4, GFR 15-29 ml/min ([name]) 05/17/2017  . Seropositive rheumatoid arthritis ([place]) 05/17/2017  . DM (diabetes mellitus) type II controlled, neurological manifestation ([place]) 05/17/2017  . Atherosclerotic heart disease of native coronary artery with angina pectoris ([place]) 05/17/2017  . Dysphagia 05/17/2017  . High risk medication use 10/21/2016  . Primary osteoarthritis of both knees 10/21/2016  . History of right knee joint replacement 10/21/2016  . Orthostatic hypotension 10/01/2016  . Tegretol-induced dizziness 05/14/2016  . Spinal stenosis of lumbar region without neurogenic claudication 03/23/2016  . Lumbar facet arthropathy 03/23/2016  . Respiratory failure, chronic ([place]) 11/21/2014  . Rheumatoid arthritis ([place]) 11/05/2014  . Rheumatic fever without heart involvement 03/04/2014  . Ventral hernia 12/17/2013  . Routine general medical examination at a health care facility 08/29/2012  . Routine history and physical examination of adult 08/29/2012  . ILD (interstitial lung disease) ([place]) 11/28/2011  . Chronic diastolic heart failure ([place]) 09/14/2011  . Diabetic polyneuropathy associated with type 2 diabetes mellitus ([place]) 08/10/2010  . Obstructive sleep apnea 12/17/2008  . ESOPHAGEAL STRICTURE 10/09/2008  . Esophageal stricture 10/09/2008  . Reflux esophagitis 09/10/2008  . Diverticulosis of colon 09/10/2008  . Gastro-esophageal reflux disease with esophagitis 09/10/2008  . Coronary atherosclerosis 03/19/2008  . Aortic valve disorder 03/19/2008  . Thoracic aorta atherosclerosis ([place]) 03/19/2008  . Actinic keratosis 10/23/2007  . SLEEP DISORDER, CHRONIC 10/17/2006  . Disturbance in sleep behavior 10/17/2006  . 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 Essential hypertension 09/23/2006  . GERD 09/23/2006  . BENIGN PROSTATIC HYPERTROPHY 09/23/2006  . Enlarged prostate without lower urinary tract symptoms (luts) 09/23/2006  .  HLD (hyperlipidemia) 09/23/2006    Past Surgical History:  Procedure Laterality Date  . BALLOON DILATION N/A 09/12/2018   Procedure: BALLOON DILATION;  Surgeon: Irene Shipper, MD;  Location: [facility];  Service: Endoscopy;  Laterality: N/A;  . BALLOON DILATION N/A 02/20/2020   Procedure: BALLOON DILATION;  Surgeon: Irene Shipper, MD;  Location: [facility] ENDOSCOPY;  Service: Endoscopy;  Laterality: N/A;  . BOTOX INJECTION N/A 09/12/2018   Procedure: BOTOX INJECTION;  Surgeon: Irene Shipper, MD;  Location: [facility] ENDOSCOPY;  Service: Endoscopy;  Laterality: N/A;  . BOTOX INJECTION N/A 11/27/2018   Procedure: BOTOX INJECTION;  Surgeon: Irene Shipper, MD;  Location: [facility] ENDOSCOPY;  Service: Endoscopy;  Laterality: N/A;  . BOTOX INJECTION N/A 02/20/2020   Procedure: BOTOX INJECTION;  Surgeon: Irene Shipper, MD;  Location: [facility] ENDOSCOPY;  Service: Endoscopy;  Laterality: N/A;  . CARDIAC CATHETERIZATION  08/2004   CP- no MI, Cath- small vessell disease   . CARDIAC CATHETERIZATION  12/31/2011   80% distal LM, 100% native LAD, LCx and RCA, 30%  prox SVG-OM, SVG-D1 normal, 99% distal, 80% ostial SVG-RCA distal to graft, LIMA-LAD normal; LVEF mildly decreased with posterior basal AK   . CARDIAC CATHETERIZATION  2009   with patent grafts/notes 12/31/2011  . CARDIAC CATHETERIZATION N/A 08/13/2015   Procedure: Left Heart Cath and Cors/Grafts Angiography;  Surgeon: Tonny Bollman, MD;  Location: [facility] INVASIVE CV LAB;  Service: Cardiovascular;  Laterality: N/A;  . CATARACT EXTRACTION W/ INTRAOCULAR LENS  IMPLANT, BILATERAL Bilateral   . CHOLECYSTECTOMY OPEN  11/2003   Christella Hartigan  . CORONARY ANGIOPLASTY WITH STENT PLACEMENT  01/03/2012   Successful DES to SVG-RCA and cutting balloon angioplasty ostial  PDA   . CORONARY ANGIOPLASTY WITH STENT PLACEMENT  01/14/2014   "1"  . CORONARY ARTERY BYPASS GRAFT  11/1999   CABG X5  . CORONARY STENT PLACEMENT  02/2012   1 stent and balloon  . ESOPHAGEAL DILATION  11/27/2018   Procedure:  ESOPHAGEAL DILATION;  Surgeon: Hilarie Fredrickson, MD;  Location: [facility] ENDOS[facility];  Service: Endoscopy;;  . ESOPHAGOGASTRODUODENOSCOPY N/A 03/01/2017   Procedure: ESOPHAGOGASTRODUODENOSCOPY (EGD);  Surgeon: Hilarie Fredrickson, MD;  Location: [facility];  Service: Endoscopy;  Laterality: N/A;  . ESOPHAGOGASTRODUODENOSCOPY (EGD) WITH ESOPHAGEAL DILATION  2010  . ESOPHAGOGASTRODUODENOSCOPY (EGD) WITH PROPOFOL N/A 09/12/2018   Procedure: ESOPHAGOGASTRODUODENOSCOPY (EGD) WITH PROPOFOL;  Surgeon: Hilarie Fredrickson, MD;  Location: [facility];  Service: Endoscopy;  Laterality: N/A;  . ESOPHAGOGASTRODUODENOSCOPY (EGD) WITH PROPOFOL N/A 11/27/2018   Procedure: ESOPHAGOGASTRODUODENOSCOPY (EGD) WITH PROPOFOL, WITH BALLOON DILATION;  Surgeon: Hilarie Fredrickson, MD;  Location: [facility];  Service: Endoscopy;  Laterality: N/A;  . ESOPHAGOGASTRODUODENOSCOPY (EGD) WITH PROPOFOL N/A 02/20/2020   Procedure: ESOPHAGOGASTRODUODENOSCOPY (EGD) WITH PROPOFOL;  Surgeon: Hilarie Fredrickson, MD;  Location: [facility] ENDOSCOPY;  Service: Endoscopy;  Laterality: N/A;  . HAND SURGERY     bilateral carpal tunnel releases  . JOINT REPLACEMENT    . KNEE ARTHROSCOPY Right 2008  . LEFT AND RIGHT HEART CATHETERIZATION WITH CORONARY ANGIOGRAM  12/31/2011   Procedure: LEFT AND RIGHT HEART CATHETERIZATION WITH CORONARY ANGIOGRAM;  Surgeon: Kathleene Hazel, MD;  Location: [facility] CATH LAB;  Service: Cardiovascular;;  . LEFT AND RIGHT HEART CATHETERIZATION WITH CORONARY ANGIOGRAM N/A 01/14/2014   Procedure: LEFT AND RIGHT HEART CATHETERIZATION WITH CORONARY ANGIOGRAM;  Surgeon: Peter M Swaziland, MD;  Location: [facility] CATH LAB;  Service: Cardiovascular;  Laterality: N/A;  . MALONEY DILATION  03/01/2017   Procedure: Elease Hashimoto DILATION;  Surgeon: Hilarie Fredrickson, MD;  Location: [facility];  Service: Endoscopy;;  . PERCUTANEOUS CORONARY INTERVENTION-BALLOON ONLY  01/03/2012   Procedure: PERCUTANEOUS CORONARY INTERVENTION-BALLOON ONLY;  Surgeon: Peter M Swaziland, MD;  Location:  [facility] CATH LAB;  Service: Cardiovascular;;  . PERCUTANEOUS CORONARY STENT INTERVENTION (PCI-S)  12/31/2011   Procedure: PERCUTANEOUS CORONARY STENT INTERVENTION (PCI-S);  Surgeon: Kathleene Hazel, MD;  Location: [facility] CATH LAB;  Service: Cardiovascular;;  . PERCUTANEOUS CORONARY STENT INTERVENTION (PCI-S) N/A 01/03/2012   Procedure: PERCUTANEOUS CORONARY STENT INTERVENTION (PCI-S);  Surgeon: Peter M Swaziland, MD;  Location: [facility] CATH LAB;  Service: Cardiovascular;  Laterality: N/A;  . SHOULDER ARTHROSCOPY WITH OPEN ROTATOR CUFF REPAIR AND DISTAL CLAVICLE ACROMINECTOMY Left 02/27/2013   Procedure: LEFT SHOULDER ARTHROSCOPY WITH MINI OPEN ROTATOR CUFF REPAIR AND SUBACROMIAL DECOMPRESSION AND DISTAL CLAVICLE RESECTION;  Surgeon: Valeria Batman, MD;  Location: [facility] OR;  Service: Orthopedics;  Laterality: Left;  . TOTAL KNEE ARTHROPLASTY Right 03/2010   Dr Brynda Greathouse  . TRIGGER FINGER RELEASE Left 02/27/2013   Procedure: RELEASE TRIGGER FINGER/A-1 PULLEY;  Surgeon:  Valeria Batman, MD;  [facility];  Service: Orthopedics;  Laterality: Left;       Family History  Problem Relation Age of Onset  . COPD Mother   . Heart disease Father   . Heart attack Father   . Stomach cancer Brother   . Stroke Sister   . Alcohol abuse Sister   . Colon cancer Brother 25  . Diabetes Brother   . Rectal cancer Neg Hx     Social History   Tobacco Use  . Smoking status: Former Smoker    Packs/day: 1.00    Years: 20.00    Pack years: 20.00    Types: Cigarettes    Quit date: 04/06/1963    Years since quitting: 57.0  . Smokeless tobacco: Former Clinical biochemist  . Vaping Use: Never used  Substance Use Topics  . Alcohol use: No    Alcohol/week: 0.0 standard drinks    Comment: 01/01/2012 "last alcohol ~ 50 yr ago"  . Drug use: No    Home Medications Prior to Admission medications   Medication Sig Start Date End Date Taking? Authorizing Provider  amLODipine (NORVASC) 5 MG tablet Take 5 mg by mouth daily.   08/06/19   [provider]  aspirin EC 81 MG tablet Take 81 mg by mouth at bedtime. Patient not taking: Reported on 04/09/2020    [provider]  Blood Glucose Monitoring Suppl (ONE TOUCH ULTRA 2) w/Device KIT Use to obtain blood sugar daily. Dx Code E11.40 04/12/19   Tillman Abide I, MD  carvedilol (COREG) 6.25 MG tablet Take 6.25 mg by mouth 2 (two) times daily. 11/15/19   [provider]  clopidogrel (PLAVIX) 75 MG tablet Take 1 tablet (75 mg total) by mouth daily. Patient not taking: Reported on 04/09/2020 01/22/20   Rosalio Macadamia, NP  DHA-Vitamin C-Lutein (EYE HEALTH FORMULA PO) Take 1 tablet by mouth daily.     [provider]  furosemide (LASIX) 40 MG tablet Take 40 mg by mouth 4 (four) times a week. Monday, Wednesday, Friday & Sunday    [provider]  glucose blood (ONE TOUCH ULTRA TEST) test strip USE TO CHECK BLOOD SUGAR ONCE A DAY Dx Code E11.40 01/29/20   Karie Schwalbe, MD  HYDROcodone-acetaminophen (NORCO) 10-325 MG tablet Take 2 tablets by mouth 3 (three) times daily as needed for severe pain. Must last 30 days. Max 5 tablets/day 03/13/20 04/12/20  Edward Jolly, MD  HYDROcodone-acetaminophen (NORCO) 10-325 MG tablet Take 2 tablets by mouth 2 (two) times daily as needed for severe pain. Must last 30 days. Max 4 tablets/day Patient taking differently: Take 2 tablets by mouth in the morning and at bedtime. Must last 30 days. Max 4 tablets/day 04/12/20 05/12/20  Edward Jolly, MD  linaclotide Karlene Einstein) 145 MCG CAPS capsule Take by mouth. 10/21/17   [provider]  losartan (COZAAR) 25 MG tablet Take 25 mg by mouth at bedtime.  08/06/19   [provider]  nitroGLYCERIN (NITROSTAT) 0.4 MG SL tablet DISSOLVE 1 TABLET UNDER TONGUE AS NEEDEDFOR CHEST PAIN. MAY REPEAT 5 MINUTES APART 3 TIMES IF NEEDED. IF NO RELIEF CALL 911 04/10/20   Tillman Abide I, MD  ondansetron (ZOFRAN) 4 MG tablet TAKE 1 TABLET BY MOUTH EVERY 8 HOURS AS NEEDED FOR  NAUSEA OR VOMITING Patient taking differently: Take 4 mg by mouth every 8 (eight) hours as needed (nausea/vomiting). 08/21/19   Karie Schwalbe, MD  OneTouch Delica Lancets 33G MISC  1 each by In Vitro route daily. Dx Code E11.49 01/29/20   [name], MD  OXYGEN Inhale into the lungs. Per pt- Uses Oxygen 2 liters at night.    [provider]  pantoprazole (PROTONIX) 40 MG tablet Take 1 tablet (40 mg total) by mouth daily. 08/06/19   [name], MD  polyethylene glycol (MIRALAX / GLYCOLAX) 17 g packet Take 51 g by mouth every other day.     [provider]  pregabalin (LYRICA) 25 MG capsule Take 1 capsule (25 mg total) by mouth at bedtime for 20 days, THEN 2 capsules (50 mg total) at bedtime. Patient taking differently: Take 1 capsule (25 mg) by mouth at bedtime. 02/12/20 05/12/20  Gillis Santa, MD  Semaglutide 3 MG TABS Take 3 mg by mouth daily before breakfast. 12/14/19   [name], MD  tamsulosin (FLOMAX) 0.4 MG CAPS capsule Take 0.4 mg by mouth at bedtime.  01/31/19   [provider]    Allergies    Doxazosin mesylate and Methocarbamol  Review of Systems   Review of Systems  Cardiovascular: Positive for chest pain.  All other systems reviewed and are negative.   Physical Exam Updated Vital Signs BP (!) 145/96 (BP Location: Right Arm)   Pulse 67   Temp 98.5 F (36.9 C) (Oral)   Resp 16   SpO2 96%   Physical Exam Vitals and nursing note reviewed.  Constitutional:      Appearance: He is well-developed and well-nourished.  HENT:     Head: Normocephalic and atraumatic.  Cardiovascular:     Rate and Rhythm: Normal rate and regular rhythm.     Heart sounds: Murmur heard.    Pulmonary:     Effort: Pulmonary effort is normal. No respiratory distress.     Breath sounds: Normal breath sounds.  Abdominal:     Palpations: Abdomen is soft.     Tenderness: There is no abdominal tenderness. There is no guarding or rebound.   Musculoskeletal:        General: No swelling, tenderness or edema.  Skin:    General: Skin is warm and dry.  Neurological:     Mental Status: He is alert and oriented to person, place, and time.  Psychiatric:        Mood and Affect: Mood and affect normal.        Behavior: Behavior normal.     ED Results / Procedures / Treatments   Labs (all labs ordered are listed, but only abnormal results are displayed) Labs Reviewed  BASIC METABOLIC PANEL - Abnormal; Notable for the following components:      Result Value   CO2 21 (*)    Glucose, Bld 588 (*)    BUN 45 (*)    Creatinine, Ser 2.33 (*)    GFR, Estimated 27 (*)    All other components within normal limits  CBC - Abnormal; Notable for the following components:   WBC 18.9 (*)    All other components within normal limits  CBG MONITORING, ED - Abnormal; Notable for the following components:   Glucose-Capillary 350 (*)    All other components within normal limits  CBG MONITORING, ED - Abnormal; Notable for the following components:   Glucose-Capillary 313 (*)    All other components within normal limits  TROPONIN I (HIGH SENSITIVITY) - Abnormal; Notable for the following components:   Troponin I (High Sensitivity) 29 (*)    All other components within normal limits  TROPONIN I (HIGH SENSITIVITY) - Abnormal; Notable for the following components:   Troponin I (High Sensitivity) 37 (*)    All other components within normal limits  TROPONIN I (HIGH SENSITIVITY) - Abnormal; Notable for the following components:   Troponin I (High Sensitivity) 49 (*)    All other components within normal limits  SARS CORONAVIRUS 2 (TAT 6-24 HRS)    EKG EKG Interpretation  Date/Time:  Thursday April 10 2020 14:21:47 EST Ventricular Rate:  79 PR Interval:  156 QRS Duration: 110 QT Interval:  382 QTC Calculation: 438 R Axis:   -13 Text Interpretation: Normal sinus rhythm Normal ECG Confirmed by Quintella Reichert [phone]) on 04/10/2020 7:10:42  PM   Radiology DG Chest 2 View  Result Date: 04/10/2020 CLINICAL DATA:  Acute chest pain and shortness of breath for 1 day. EXAM: CHEST - 2 VIEW COMPARISON:  04/03/2019 chest radiograph and prior studies FINDINGS: The cardiomediastinal silhouette is unremarkable. CABG changes are again noted. There is no evidence of focal airspace disease, pulmonary edema, suspicious pulmonary nodule/mass, pleural effusion, or pneumothorax. No acute bony abnormalities are identified. IMPRESSION: No active cardiopulmonary disease. Electronically Signed   By: Margarette Canada M.D.   On: 04/10/2020 14:45   DG PAIN CLINIC C-ARM 1-60 MIN NO REPORT  Result Date: 04/09/2020 Fluoro was used, but no Radiologist interpretation will be provided. Please refer to "NOTES" tab for provider progress note.   Procedures Procedures (including critical care time)  Medications Ordered in ED Medications  insulin aspart (novoLOG) injection 8 Units (8 Units Subcutaneous Given 04/10/20 2059)  alum & mag hydroxide-simeth (MAALOX/MYLANTA) 200-200-20 MG/5ML suspension 15 mL (15 mLs Oral Given 04/10/20 2059)  famotidine (PEPCID) tablet 20 mg (20 mg Oral Given 04/10/20 2100)  carvedilol (COREG) tablet 6.25 mg (6.25 mg Oral Given 04/10/20 2236)    ED Course  I have reviewed the triage vital signs and the nursing notes.  Pertinent labs & imaging results that were available during my care of the patient were reviewed by me and considered in my medical decision making (see chart for details).    MDM Rules/Calculators/A&P                         Patient with history of significant coronary artery disease status post cabbage and multiple stents, most recently in 2017 as well as interstitial lung disease, rheumatoid arthritis, CKD with baseline creatinine of two here for evaluation of two episodes of chest pain. EKG is without acute ischemic changes. The patient did require multiple doses of nitroglycerin prior to resolution of his pain. Initial  troponin is mildly elevated. Second troponin is slightly increased compared to initial one. At time of evaluation the department he is requesting discharge home but discussed abnormal troponins, recommend additional troponin to be checked. Third troponin returned and is more elevated. Cardiology consulted regarding recommendations - recommendation for observation for serial troponins. Medicine consulted for admission. Patient is in agreement with treatment plan.  CBC with leukocytosis, likely secondary to recent steroid injection, no evidence of acute infectious process at this time. BMP with significant hyperglycemia, no evidence of DKA. Hyperglycemia likely secondary to steroids, blood sugar improving after insulin administration.  Final Clinical Impression(s) / ED Diagnoses Final diagnoses:  Precordial pain    Rx / DC Orders ED Discharge Orders    None       Quintella Reichert, MD 04/10/20 2311    Quintella Reichert, MD 04/10/20 2314

## 2020-04-10 NOTE — Telephone Encounter (Signed)
Called patient back. Informed him that Dr. Johnsie Cancel advised him to go to the ED. Patient verbalized understanding.

## 2020-04-10 NOTE — Telephone Encounter (Signed)
Should go to ER now.

## 2020-04-10 NOTE — Telephone Encounter (Signed)
Denies post procedure problems. 

## 2020-04-11 ENCOUNTER — Observation Stay (HOSPITAL_BASED_OUTPATIENT_CLINIC_OR_DEPARTMENT_OTHER): Payer: Medicare Other

## 2020-04-11 ENCOUNTER — Encounter (HOSPITAL_COMMUNITY): Payer: Self-pay | Admitting: Internal Medicine

## 2020-04-11 ENCOUNTER — Other Ambulatory Visit: Payer: Self-pay

## 2020-04-11 DIAGNOSIS — I5032 Chronic diastolic (congestive) heart failure: Secondary | ICD-10-CM

## 2020-04-11 DIAGNOSIS — R072 Precordial pain: Secondary | ICD-10-CM

## 2020-04-11 DIAGNOSIS — E1140 Type 2 diabetes mellitus with diabetic neuropathy, unspecified: Secondary | ICD-10-CM | POA: Diagnosis not present

## 2020-04-11 DIAGNOSIS — I10 Essential (primary) hypertension: Secondary | ICD-10-CM | POA: Diagnosis not present

## 2020-04-11 DIAGNOSIS — E119 Type 2 diabetes mellitus without complications: Secondary | ICD-10-CM | POA: Diagnosis not present

## 2020-04-11 DIAGNOSIS — R079 Chest pain, unspecified: Secondary | ICD-10-CM

## 2020-04-11 LAB — CBC
HCT: 43.2 % (ref 39.0–52.0)
Hemoglobin: 14.1 g/dL (ref 13.0–17.0)
MCH: 28.4 pg (ref 26.0–34.0)
MCHC: 32.6 g/dL (ref 30.0–36.0)
MCV: 87.1 fL (ref 80.0–100.0)
Platelets: 195 10*3/uL (ref 150–400)
RBC: 4.96 MIL/uL (ref 4.22–5.81)
RDW: 13.3 % (ref 11.5–15.5)
WBC: 20.3 10*3/uL — ABNORMAL HIGH (ref 4.0–10.5)
nRBC: 0 % (ref 0.0–0.2)

## 2020-04-11 LAB — CBG MONITORING, ED
Glucose-Capillary: 239 mg/dL — ABNORMAL HIGH (ref 70–99)
Glucose-Capillary: 216 mg/dL — ABNORMAL HIGH (ref 70–99)
Glucose-Capillary: 201 mg/dL — ABNORMAL HIGH (ref 70–99)
Glucose-Capillary: 181 mg/dL — ABNORMAL HIGH (ref 70–99)
Glucose-Capillary: 238 mg/dL — ABNORMAL HIGH (ref 70–99)

## 2020-04-11 LAB — GLUCOSE, CAPILLARY: Glucose-Capillary: 199 mg/dL — ABNORMAL HIGH (ref 70–99)

## 2020-04-11 LAB — BASIC METABOLIC PANEL
Anion gap: 13 (ref 5–15)
BUN: 42 mg/dL — ABNORMAL HIGH (ref 8–23)
CO2: 24 mmol/L (ref 22–32)
Calcium: 10.1 mg/dL (ref 8.9–10.3)
Chloride: 102 mmol/L (ref 98–111)
Creatinine, Ser: 1.98 mg/dL — ABNORMAL HIGH (ref 0.61–1.24)
GFR, Estimated: 32 mL/min — ABNORMAL LOW (ref >60)
Glucose, Bld: 264 mg/dL — ABNORMAL HIGH (ref 70–99)
Potassium: 4.2 mmol/L (ref 3.5–5.1)
Sodium: 139 mmol/L (ref 135–145)

## 2020-04-11 LAB — TROPONIN I (HIGH SENSITIVITY)
Troponin I (High Sensitivity): 68 ng/L — ABNORMAL HIGH (ref <18)
Troponin I (High Sensitivity): 72 ng/L — ABNORMAL HIGH (ref <18)
Troponin I (High Sensitivity): 47 ng/L — ABNORMAL HIGH (ref <18)
Troponin I (High Sensitivity): 71 ng/L — ABNORMAL HIGH (ref <18)

## 2020-04-11 LAB — ECHOCARDIOGRAM COMPLETE
AR max vel: 1.2 cm2
AV Area VTI: 1.22 cm2
AV Area mean vel: 1.15 cm2
AV Mean grad: 14 mmHg
AV Peak grad: 22.2 mmHg
Ao pk vel: 2.36 m/s
Area-P 1/2: 2 cm2
S' Lateral: 3.4 cm

## 2020-04-11 LAB — HEMOGLOBIN A1C
Hgb A1c MFr Bld: 8.9 % — ABNORMAL HIGH (ref 4.8–5.6)
Mean Plasma Glucose: 208.73 mg/dL

## 2020-04-11 LAB — SARS CORONAVIRUS 2 (TAT 6-24 HRS): SARS Coronavirus 2: NEGATIVE

## 2020-04-11 MED ORDER — CARVEDILOL 6.25 MG PO TABS
6.2500 mg | ORAL_TABLET | Freq: Two times a day (BID) | ORAL | Status: DC
Start: 2020-04-11 — End: 2020-04-12
  Administered 2020-04-11 (×2): 6.25 mg via ORAL
  Filled 2020-04-11: qty 1
  Filled 2020-04-11: qty 2
  Filled 2020-04-11: qty 1

## 2020-04-11 MED ORDER — NITROGLYCERIN 0.4 MG SL SUBL: 0.4000 mg | SUBLINGUAL_TABLET | SUBLINGUAL | Status: DC | PRN

## 2020-04-11 MED ORDER — ASPIRIN EC 81 MG PO TBEC
81.0000 mg | DELAYED_RELEASE_TABLET | Freq: Every day | ORAL | Status: DC
  Administered 2020-04-11 – 2020-04-12 (×3): 81 mg via ORAL
  Filled 2020-04-11 (×3): qty 1

## 2020-04-11 MED ORDER — CLOPIDOGREL BISULFATE 75 MG PO TABS
75.0000 mg | ORAL_TABLET | Freq: Every day | ORAL | Status: DC
  Administered 2020-04-11 – 2020-04-13 (×3): 75 mg via ORAL
  Filled 2020-04-11 (×3): qty 1

## 2020-04-11 MED ORDER — LOSARTAN POTASSIUM 25 MG PO TABS
25.0000 mg | ORAL_TABLET | Freq: Every day | ORAL | Status: DC
Start: 2020-04-12 — End: 2020-04-12

## 2020-04-11 MED ORDER — HYDRALAZINE HCL 20 MG/ML IJ SOLN: 10.0000 mg | Freq: Four times a day (QID) | INTRAMUSCULAR | Status: DC | PRN

## 2020-04-11 MED ORDER — HYDROCODONE-ACETAMINOPHEN 10-325 MG PO TABS
2.0000 | ORAL_TABLET | Freq: Two times a day (BID) | ORAL | Status: DC | PRN
  Administered 2020-04-11 – 2020-04-13 (×4): 2 via ORAL
  Filled 2020-04-11 (×4): qty 2

## 2020-04-11 MED ORDER — ENOXAPARIN SODIUM 40 MG/0.4ML ~~LOC~~ SOLN
40.0000 mg | SUBCUTANEOUS | Status: DC
  Administered 2020-04-11 – 2020-04-12 (×2): 40 mg via SUBCUTANEOUS
  Filled 2020-04-11 (×2): qty 0.4

## 2020-04-11 MED ORDER — TAMSULOSIN HCL 0.4 MG PO CAPS
0.4000 mg | ORAL_CAPSULE | Freq: Every day | ORAL | Status: DC
  Administered 2020-04-11 – 2020-04-12 (×3): 0.4 mg via ORAL
  Filled 2020-04-11 (×3): qty 1

## 2020-04-11 MED ORDER — PREGABALIN 25 MG PO CAPS
25.0000 mg | ORAL_CAPSULE | Freq: Every day | ORAL | Status: DC
  Administered 2020-04-11 – 2020-04-12 (×3): 25 mg via ORAL
  Filled 2020-04-11 (×3): qty 1

## 2020-04-11 MED ORDER — AMLODIPINE BESYLATE 5 MG PO TABS
5.0000 mg | ORAL_TABLET | Freq: Every day | ORAL | Status: DC
  Administered 2020-04-11 – 2020-04-13 (×3): 5 mg via ORAL
  Filled 2020-04-11 (×3): qty 1

## 2020-04-11 MED ORDER — PANTOPRAZOLE SODIUM 40 MG PO TBEC
40.0000 mg | DELAYED_RELEASE_TABLET | Freq: Every day | ORAL | Status: DC
  Administered 2020-04-11 – 2020-04-13 (×3): 40 mg via ORAL
  Filled 2020-04-11 (×3): qty 1

## 2020-04-11 MED ORDER — INSULIN ASPART 100 UNIT/ML ~~LOC~~ SOLN
0.0000 [IU] | SUBCUTANEOUS | Status: DC
  Administered 2020-04-11: 2 [IU] via SUBCUTANEOUS
  Administered 2020-04-11: 3 [IU] via SUBCUTANEOUS
  Administered 2020-04-11: 5 [IU] via SUBCUTANEOUS
  Administered 2020-04-11: 2 [IU] via SUBCUTANEOUS
  Administered 2020-04-11 (×2): 3 [IU] via SUBCUTANEOUS
  Administered 2020-04-12 (×3): 2 [IU] via SUBCUTANEOUS
  Administered 2020-04-12: 1 [IU] via SUBCUTANEOUS
  Administered 2020-04-12: 5 [IU] via SUBCUTANEOUS
  Administered 2020-04-12: 2 [IU] via SUBCUTANEOUS
  Administered 2020-04-13: 3 [IU] via SUBCUTANEOUS
  Administered 2020-04-13 (×3): 2 [IU] via SUBCUTANEOUS

## 2020-04-11 MED ORDER — FUROSEMIDE 40 MG PO TABS
40.0000 mg | ORAL_TABLET | ORAL | Status: DC
  Administered 2020-04-11: 40 mg via ORAL
  Filled 2020-04-11: qty 2

## 2020-04-11 NOTE — ED Notes (Signed)
Wife dorothy updated, [phone]

## 2020-04-11 NOTE — ED Notes (Signed)
Room tray ordered.

## 2020-04-11 NOTE — Plan of Care (Signed)

## 2020-04-11 NOTE — H&P (Signed)
History and Physical    [name] XVQ:[id number] DOB: [birth year] DOA: 04/10/2020  PCP: [name], MD  Patient coming from: Home.  Chief Complaint: Chest pain.  HPI: [name] is a 87 y.o. male with history of CAD status post CABG and stenting, interstitial lung disease and rheumatoid arthritis presently not on any immunosuppressants, diabetes mellitus type 2, hypertension, mild aortic stenosis and chronic pain who had received recently epidural discharged about 2 days ago started experiencing chest pressure over the last 24 hours which has been gradually persistent even at rest.  Denies any associated shortness of breath.  Pain is mostly in the lower part of the sternum.  No vomiting diarrhea.  ED Course: In the ER patient chest pain improved with sublingual nitroglycerin.  EKG shows normal sinus rhythm high sensitive troponin was mildly elevated and is showing a small increasing trend.  Cardiologist recommended admission for observation.  Labs also show mild leukocytosis and markedly elevated blood sugar with a blood sugar in 588 with anion gap of 15.  Review of Systems: As per HPI, rest all negative.   Past Medical History:  Diagnosis Date  . Arthritis    osteoarthritis, s/p R TKR, and digits  . CAD (coronary artery disease)    a. s/p CABG (2001)  b. s/p DES to RCA and cutting POBA to ostial PDA (2013)   c. s/p DES to SVG to OM2 (01/14/14) d. cath: 08/2015 NSTEMI w/ patent LIMA-LAD and 99% stenosis of SVG-OM w/ DES placed. CTO of SVG-RCA and SVG-D1.   . Cataract   . Chronic diastolic CHF (congestive heart failure) ([place])    a) 09/13 ECHO- LVEF 09-32%, grade 1 diastolic dysfunction, mild LA dilatation, atrial septal aneurysm, AV mobility restricted, but no sig AS by doppler; b) 09/04/08 ECHO- LVH, ef 60%, mild AS, c. echo 08/2015: EF perserved of 55-60% with inferolateral HK. Mild AS noted.  . Chronic kidney disease, stage III (moderate) ([place])   . Chronic lower back pain    . Colon polyps   . COVID-19   . Diverticulosis   . Dyspnea 2009 since July -Sept   05/06/08-CPST-  normal effort, reduced VO2 max 20.5 /65%, reduced at 8.2/ 40%, normal breathing resetvca of 55%, submaximal heart rate response 112/77%, flattened o2 pluse response at peak exercise-12 ml/beat @ 85%, No VQ mismatch abnormalities, All c/w CIRC Limitation  . Enlarged prostate   . Esophageal stricture    a. s/p dilation spring 2010  . GERD (gastroesophageal reflux disease)   . Heart murmur   . Hiatal hernia   . History of carpal tunnel syndrome    Bilateral  . History of kidney stones   . History of PFTs    mixed pattern on spiro. mild restn on lung volumes with near normal DLCO. Pattern can be explained by CABG scar. Fev1 2.2L/73%, ratio 68 (67), TLC 4.7/68%,RV 1.5L/55%,DLCO 79%  . Hyperlipidemia   . Hypertension   . Interstitial lung disease ([name])    NOS  . Iron deficiency anemia   . Nausea & vomiting    2018/2019  . On home oxygen therapy    2 L [place] at bedtime  . Osteoporosis   . Overweight (BMI 25.0-29.9)    BMI 29  . Peripheral neuropathy   . RA (rheumatoid arthritis) ([name])    [name]  . Seropositive rheumatoid arthritis ([place])   . Type II diabetes mellitus ([name])    diet controlled  . Wears glasses   .  Wears partial dentures    upper    Past Surgical History:  Procedure Laterality Date  . BALLOON DILATION N/A 09/12/2018   Procedure: BALLOON DILATION;  Surgeon: Hilarie Fredrickson, MD;  Location: [facility];  Service: Endoscopy;  Laterality: N/A;  . BALLOON DILATION N/A 02/20/2020   Procedure: BALLOON DILATION;  Surgeon: Hilarie Fredrickson, MD;  Location: [facility];  Service: Endoscopy;  Laterality: N/A;  . BOTOX INJECTION N/A 09/12/2018   Procedure: BOTOX INJECTION;  Surgeon: Hilarie Fredrickson, MD;  Location: [facility];  Service: Endoscopy;  Laterality: N/A;  . BOTOX INJECTION N/A 11/27/2018   Procedure: BOTOX INJECTION;  Surgeon: Hilarie Fredrickson, MD;  Location: [facility];   Service: Endoscopy;  Laterality: N/A;  . BOTOX INJECTION N/A 02/20/2020   Procedure: BOTOX INJECTION;  Surgeon: Hilarie Fredrickson, MD;  Location: [facility];  Service: Endoscopy;  Laterality: N/A;  . CARDIAC CATHETERIZATION  08/2004   CP- no MI, Cath- small vessell disease   . CARDIAC CATHETERIZATION  12/31/2011   80% distal LM, 100% native LAD, LCx and RCA, 30% prox SVG-OM, SVG-D1 normal, 99% distal, 80% ostial SVG-RCA distal to graft, LIMA-LAD normal; LVEF mildly decreased with posterior basal AK   . CARDIAC CATHETERIZATION  2009   with patent grafts/notes 12/31/2011  . CARDIAC CATHETERIZATION N/A 08/13/2015   Procedure: Left Heart Cath and Cors/Grafts Angiography;  Surgeon: Tonny Bollman, MD;  Location: [facility] INVASIVE CV LAB;  Service: Cardiovascular;  Laterality: N/A;  . CATARACT EXTRACTION W/ INTRAOCULAR LENS  IMPLANT, BILATERAL Bilateral   . CHOLECYSTECTOMY OPEN  11/2003   Christella Hartigan  . CORONARY ANGIOPLASTY WITH STENT PLACEMENT  01/03/2012   Successful DES to SVG-RCA and cutting balloon angioplasty ostial  PDA   . CORONARY ANGIOPLASTY WITH STENT PLACEMENT  01/14/2014   "1"  . CORONARY ARTERY BYPASS GRAFT  11/1999   CABG X5  . CORONARY STENT PLACEMENT  02/2012   1 stent and balloon  . ESOPHAGEAL DILATION  11/27/2018   Procedure: ESOPHAGEAL DILATION;  Surgeon: Hilarie Fredrickson, MD;  Location: [facility];  Service: Endoscopy;;  . ESOPHAGOGASTRODUODENOSCOPY N/A 03/01/2017   Procedure: ESOPHAGOGASTRODUODENOSCOPY (EGD);  Surgeon: Hilarie Fredrickson, MD;  Location: [facility];  Service: Endoscopy;  Laterality: N/A;  . ESOPHAGOGASTRODUODENOSCOPY (EGD) WITH ESOPHAGEAL DILATION  2010  . ESOPHAGOGASTRODUODENOSCOPY (EGD) WITH PROPOFOL N/A 09/12/2018   Procedure: ESOPHAGOGASTRODUODENOSCOPY (EGD) WITH PROPOFOL;  Surgeon: Hilarie Fredrickson, MD;  Location: [facility];  Service: Endoscopy;  Laterality: N/A;  . ESOPHAGOGASTRODUODENOSCOPY (EGD) WITH PROPOFOL N/A 11/27/2018   Procedure: ESOPHAGOGASTRODUODENOSCOPY (EGD) WITH  PROPOFOL, WITH BALLOON DILATION;  Surgeon: Hilarie Fredrickson, MD;  Location: [facility];  Service: Endoscopy;  Laterality: N/A;  . ESOPHAGOGASTRODUODENOSCOPY (EGD) WITH PROPOFOL N/A 02/20/2020   Procedure: ESOPHAGOGASTRODUODENOSCOPY (EGD) WITH PROPOFOL;  Surgeon: Hilarie Fredrickson, MD;  Location: [facility];  Service: Endoscopy;  Laterality: N/A;  . HAND SURGERY     bilateral carpal tunnel releases  . JOINT REPLACEMENT    . KNEE ARTHROSCOPY Right 2008  . LEFT AND RIGHT HEART CATHETERIZATION WITH CORONARY ANGIOGRAM  12/31/2011   Procedure: LEFT AND RIGHT HEART CATHETERIZATION WITH CORONARY ANGIOGRAM;  Surgeon: Kathleene Hazel, MD;  Location: [facility] CATH LAB;  Service: Cardiovascular;;  . LEFT AND RIGHT HEART CATHETERIZATION WITH CORONARY ANGIOGRAM N/A 01/14/2014   Procedure: LEFT AND RIGHT HEART CATHETERIZATION WITH CORONARY ANGIOGRAM;  Surgeon: Peter M Swaziland, MD;  Location: [facility] CATH LAB;  Service: Cardiovascular;  Laterality: N/A;  . MALONEY DILATION  03/01/2017   Procedure:  MALONEY DILATION;  Surgeon: Irene Shipper, MD;  Location: [facility];  Service: Endoscopy;;  . PERCUTANEOUS CORONARY INTERVENTION-BALLOON ONLY  01/03/2012   Procedure: PERCUTANEOUS CORONARY INTERVENTION-BALLOON ONLY;  Surgeon: Peter M Martinique, MD;  Location: [facility] CATH LAB;  Service: Cardiovascular;;  . PERCUTANEOUS CORONARY STENT INTERVENTION (PCI-S)  12/31/2011   Procedure: PERCUTANEOUS CORONARY STENT INTERVENTION (PCI-S);  Surgeon: Burnell Blanks, MD;  Location: [facility] CATH LAB;  Service: Cardiovascular;;  . PERCUTANEOUS CORONARY STENT INTERVENTION (PCI-S) N/A 01/03/2012   Procedure: PERCUTANEOUS CORONARY STENT INTERVENTION (PCI-S);  Surgeon: Peter M Martinique, MD;  Location: [name] CATH LAB;  Service: Cardiovascular;  Laterality: N/A;  . SHOULDER ARTHROSCOPY WITH OPEN ROTATOR CUFF REPAIR AND DISTAL CLAVICLE ACROMINECTOMY Left 02/27/2013   Procedure: LEFT SHOULDER ARTHROSCOPY WITH MINI OPEN ROTATOR CUFF REPAIR AND SUBACROMIAL  DECOMPRESSION AND DISTAL CLAVICLE RESECTION;  Surgeon: [name], MD;  Location: [place];  Service: Orthopedics;  Laterality: Left;  . TOTAL KNEE ARTHROPLASTY Right 03/2010   [name]  . [name] FINGER RELEASE Left 02/27/2013   Procedure: RELEASE [name] FINGER/A-1 PULLEY;  Surgeon: [name], MD;  Location: [facility];  Service: Orthopedics;  Laterality: Left;     reports that he quit smoking about 57 years ago. His smoking use included cigarettes. He has a 20.00 pack-year smoking history. He has quit using smokeless tobacco. He reports that he does not drink alcohol and does not use drugs.  Allergies  Allergen Reactions  . Doxazosin Mesylate Other (See Comments)    dizziness  . Methocarbamol Rash    Family History  Problem Relation Age of Onset  . COPD Mother   . Heart disease Father   . Heart attack Father   . Stomach cancer Brother   . Stroke Sister   . Alcohol abuse Sister   . Colon cancer Brother 25  . Diabetes Brother   . Rectal cancer Neg Hx     Prior to Admission medications   Medication Sig Start Date End Date Taking? Authorizing Provider  amLODipine (NORVASC) 5 MG tablet Take 5 mg by mouth daily.  08/06/19  Yes [provider]  aspirin EC 81 MG tablet Take 81 mg by mouth at bedtime.   Yes [provider]  Blood Glucose Monitoring Suppl (ONE TOUCH ULTRA 2) w/Device KIT Use to obtain blood sugar daily. Dx Code E11.40 04/12/19  Yes [name], MD  carvedilol (COREG) 6.25 MG tablet Take 6.25 mg by mouth 2 (two) times daily. 11/15/19  Yes [provider]  clopidogrel (PLAVIX) 75 MG tablet Take 1 tablet (75 mg total) by mouth daily. 01/22/20  Yes Burtis Junes, NP  DHA-Vitamin C-Lutein (EYE HEALTH FORMULA PO) Take 1 tablet by mouth daily.    Yes [provider]  furosemide (LASIX) 40 MG tablet Take 40 mg by mouth 4 (four) times a week. Monday, Wednesday, Friday & Sunday   Yes [provider]  glucose blood (ONE  TOUCH ULTRA TEST) test strip USE TO CHECK BLOOD SUGAR ONCE A DAY Dx Code E11.40 01/29/20  Yes [name], MD  HYDROcodone-acetaminophen (NORCO) 10-325 MG tablet Take 2 tablets by mouth 2 (two) times daily as needed for severe pain. Must last 30 days. Max 4 tablets/day Patient taking differently: Take 2 tablets by mouth in the morning and at bedtime. Must last 30 days. Max 4 tablets/day 04/12/20 05/12/20 Yes Gillis Santa, MD  losartan (COZAAR) 25 MG tablet Take 25 mg by mouth at bedtime.  08/06/19  Yes  [provider]  nitroGLYCERIN (NITROSTAT) 0.4 MG SL tablet DISSOLVE 1 TABLET UNDER TONGUE AS NEEDEDFOR CHEST PAIN. MAY REPEAT 5 MINUTES APART 3 TIMES IF NEEDED. IF NO RELIEF CALL 911 Patient taking differently: Place 0.4 mg under the tongue every 5 (five) minutes as needed for chest pain. 04/10/20  Yes [name], MD  ondansetron (ZOFRAN) 4 MG tablet TAKE 1 TABLET BY MOUTH EVERY 8 HOURS AS NEEDED FOR NAUSEA OR VOMITING Patient taking differently: Take 4 mg by mouth every 8 (eight) hours as needed (nausea/vomiting). 08/21/19  Yes [name], MD  OneTouch Delica Lancets 44B MISC 1 each by In Vitro route daily. Dx Code E11.49 01/29/20  Yes [name], MD  OXYGEN Inhale into the lungs. Per pt- Uses Oxygen 2 liters at night.   Yes [provider]  pantoprazole (PROTONIX) 40 MG tablet Take 1 tablet (40 mg total) by mouth daily. 08/06/19  Yes [name], MD  polyethylene glycol (MIRALAX / GLYCOLAX) 17 g packet Take 51 g by mouth every other day.    Yes [provider]  pregabalin (LYRICA) 25 MG capsule Take 1 capsule (25 mg total) by mouth at bedtime for 20 days, THEN 2 capsules (50 mg total) at bedtime. Patient taking differently: Take 1 capsule (25 mg) by mouth at bedtime. 02/12/20 05/12/20 Yes Gillis Santa, MD  Semaglutide 3 MG TABS Take 3 mg by mouth daily before breakfast. 12/14/19  Yes [name], MD  tamsulosin (FLOMAX) 0.4 MG CAPS capsule Take 0.4  mg by mouth at bedtime.  01/31/19  Yes [provider]    Physical Exam: Constitutional: Moderately built and nourished. Vitals:   04/10/20 1945 04/10/20 2100 04/10/20 2200 04/10/20 2232  BP: (!) 195/77 (!) 186/81 (!) 170/74 (!) 145/96  Pulse: 78 71 67 67  Resp: $Remo'16 20 19 16  'ADvtO$ Temp:      TempSrc:      SpO2: 97% 97% 98% 96%   Eyes: Anicteric no pallor. ENMT: No discharge from the ears eyes nose or mouth. Neck: No mass felt.  No neck rigidity. Respiratory: No rhonchi or crepitations. Cardiovascular: S1-S2 heard. Abdomen: Soft nontender bowel sounds present. Musculoskeletal: No edema. Skin: No rash. Neurologic: Alert awake oriented to time place and person.  Moves all extremities. Psychiatric: Appears normal.  Normal affect.   Labs on Admission: I have personally reviewed following labs and imaging studies  CBC: Recent Labs  Lab 04/10/20 1437  WBC 18.9*  HGB 13.3  HCT 40.6  MCV 87.1  PLT 201   Basic Metabolic Panel: Recent Labs  Lab 04/10/20 1437  NA 135  K 4.5  CL 99  CO2 21*  GLUCOSE 588*  BUN 45*  CREATININE 2.33*  CALCIUM 9.8   GFR: Estimated Creatinine Clearance: 25 mL/min (A) (by C-G formula based on SCr of 2.33 mg/dL (H)). Liver Function Tests: No results for input(s): AST, ALT, ALKPHOS, BILITOT, PROT, ALBUMIN in the last 168 hours. No results for input(s): LIPASE, AMYLASE in the last 168 hours. No results for input(s): AMMONIA in the last 168 hours. Coagulation Profile: No results for input(s): INR, PROTIME in the last 168 hours. Cardiac Enzymes: No results for input(s): CKTOTAL, CKMB, CKMBINDEX, TROPONINI in the last 168 hours. BNP (last 3 results) No results for input(s): PROBNP in the last 8760 hours. HbA1C: No results for input(s): HGBA1C in the last 72 hours. CBG: Recent Labs  Lab 04/10/20 2057 04/10/20 2235  GLUCAP 350* 313*   Lipid Profile:  No results for input(s): CHOL, HDL, LDLCALC, TRIG, CHOLHDL, LDLDIRECT in the last 72  hours. Thyroid Function Tests: No results for input(s): TSH, T4TOTAL, FREET4, T3FREE, THYROIDAB in the last 72 hours. Anemia Panel: No results for input(s): VITAMINB12, FOLATE, FERRITIN, TIBC, IRON, RETICCTPCT in the last 72 hours. Urine analysis:    Component Value Date/Time   COLORURINE YELLOW 02/19/2013 [place] 02/19/2013 1037   LABSPEC 1.020 02/19/2013 1037   PHURINE 5.0 02/19/2013 1037   GLUCOSEU NEGATIVE 02/19/2013 1037   HGBUR SMALL (A) 02/19/2013 1037   BILIRUBINUR NEGATIVE 02/19/2013 1037   KETONESUR NEGATIVE 02/19/2013 1037   PROTEINUR 30 (A) 02/19/2013 1037   UROBILINOGEN 0.2 02/19/2013 1037   NITRITE NEGATIVE 02/19/2013 1037   LEUKOCYTESUR NEGATIVE 02/19/2013 1037   Sepsis Labs: $RemoveBefo'@[email or web address]'AAbpnCEDINc$ (procalcitonin:4,lacticidven:4) )No results found for this or any previous visit (from the past 240 hour(s)).   Radiological Exams on Admission: DG Chest 2 View  Result Date: 04/10/2020 CLINICAL DATA:  Acute chest pain and shortness of breath for 1 day. EXAM: CHEST - 2 VIEW COMPARISON:  04/03/2019 chest radiograph and prior studies FINDINGS: The cardiomediastinal silhouette is unremarkable. CABG changes are again noted. There is no evidence of focal airspace disease, pulmonary edema, suspicious pulmonary nodule/mass, pleural effusion, or pneumothorax. No acute bony abnormalities are identified. IMPRESSION: No active cardiopulmonary disease. Electronically Signed   By: Margarette Canada M.D.   On: 04/10/2020 14:45   DG PAIN CLINIC C-ARM 1-60 MIN NO REPORT  Result Date: 04/09/2020 Fluoro was used, but no Radiologist interpretation will be provided. Please refer to "NOTES" tab for provider progress note.   EKG: Independently reviewed.  Normal sinus rhythm.  Assessment/Plan Principal Problem:   Chest pain Active Problems:   Familial multiple lipoprotein-type hyperlipidemia   Obstructive sleep apnea   Essential hypertension   Chronic diastolic heart failure ([name])   ILD  (interstitial lung disease) ([name])   CKD (chronic kidney disease) stage 4, GFR 15-29 ml/min ([name])   DM (diabetes mellitus) type II controlled, neurological manifestation ([place])    1. Chest pain with history of CAD concerning for angina presently resolved with sublingual nitroglycerin cardiology was notified.  Troponin showing mildly elevated increasing trend.  Will trend cardiac markers patient is on aspirin Plavix and beta-blockers.  As needed nitroglycerin.  Presently chest pain-free. 2. Diabetes mellitus type 2 with severe hyperglycemia could be from recent use of epidural injection.  Check hemoglobin A1c.  Patient did receive NovoLog 8 units in the ER.  We will closely follow CBGs metabolic panel and have further plans. 3. Hypertension uncontrolled continue home dose of amlodipine beta-blockers and Cozaar. 4. Chronic kidney disease stage III -creatinine appears to be at baseline.  Follow metabolic panel closely.  Note that patient is also on ARB and Lasix. 5. History of interstitial lung disease and rheumatoid arthritis being followed by rheumatologist.  Presently not on immunosuppressants. 6. History of chronic diastolic CHF on Lasix 4 times a week.  Appears compensated. 7. Leukocytosis could be from recent epidural shot.  Follow CBC closely.  Presently afebrile.  Covid test is pending.   DVT prophylaxis: Lovenox. Code Status: Full code. Family Communication: Discussed with patient. Disposition Plan: Home. Consults called: Cardiology. Admission status: Observation.   Rise Patience MD [facility] Pager [phone].  If 7PM-7AM, please contact night-coverage [email or web address] Password [email or web address]  04/11/2020, 1:46 AM

## 2020-04-11 NOTE — Progress Notes (Signed)
  Echocardiogram 2D Echocardiogram has been performed.  [name] 04/11/2020, 4:48 PM

## 2020-04-11 NOTE — Progress Notes (Signed)
[name] is a 87 y.o. male with history of CAD status post CABG and stenting, interstitial lung disease and rheumatoid arthritis presently not on any immunosuppressants, diabetes mellitus type 2, hypertension, mild aortic stenosis and chronic pain who had received recently epidural discharged about 2 days ago started experiencing chest pressure over the last 24 hours which has been gradually persistent even at rest.  Denies any associated shortness of breath.  Pain is mostly in the lower part of the sternum.  No vomiting diarrhea.  ED Course: In the ER patient chest pain improved with sublingual nitroglycerin.  EKG shows normal sinus rhythm high sensitive troponin was mildly elevated and is showing a small increasing trend.  Cardiologist recommended admission for observation.  Labs also show mild leukocytosis and markedly elevated blood sugar with a blood sugar in 588 with anion gap of 15.  04/11/20: Patient was seen and examined at his bedside in the ED.  His chest pain has resolved at the time of this visit.  He denies any dyspnea while at rest.  Noted uptrend of troponinS, peaked at 67.  Currently n.p.o.  Cardiology has been consulted.  Please refer to H&P dictated by my partner Dr. Hal Hope on 04/11/2020 for further details of the assessment and plan.

## 2020-04-11 NOTE — Consult Note (Addendum)
Cardiology Consultation:   Patient ID: [name]; [id number]; 02-06-1932   Admit date: 04/10/2020 Date of Consult: 04/11/2020  Primary Care Provider: [name], MD Primary Cardiologist: Dr. [name], MD   Patient Profile:   [name] is a 87 y.o. male with a hx of CAD s/p CABG (2001) with prior DES to RCA and cutting balloon to ostial PDA (2013); DES to SVG to OM2 (2015); DES to SVG to 1st Mg, 2nd Mg (2017) with known CTO of SVG to RCA and SVG to D1 on most recent [place] 2017. Also with a hx of ILD, RA not presently on immunosuppressants, DM2, HTN, HLD, mild AS per echo in 2019 and achalasia with recent EGD showing stricturing 02/2020 who is being seen today for the evaluation of chest pain at the request of Dr. Nevada Crane.   History of Present Illness:   [name] is an 87yo M with a hx as stated above who presented to [facility] 04/11/20 with chest pain which began approximately 2 days prior to presentation. He states that the chest pain initially began while attempting to go to sleep on Wednesday evening. He states that he tried to take one of his SL NTG tablets however they were expired. He then sent his wife to the pharmacy for a refill. He also states that he called the fire department medic to his home and he was given 4 ASA tablets. On his wife's return, he took on SL NTG at which time his chest pain went away and has not returned. He called our office the next day with recommendations to go to the ED given his cardiac hx and recent chest pain.   He states that over the last several weeks, he has felt more fatigued that usual. He lives at home with his wife and daughter and seems relatively active for his age. He states that his mailbox is several hundred feet down his driveway and he has noticed that he has been more SOB with return here recently. He stated that the chest pain was anterior with no radiation, reminiscent of his prior angina. He denies recent LE edema, orthopnea,  palpitations, recent illness with fever or chills. He is compliant with his medications.   In the ED, chest pain was relived with SL NTG. EKG showed NSR with no acute changes. HsT found to be 29>>37>>49>>68. Creatinine elevated above baseline at 2.33. WBC elevated at 18.9. BS elevated at 588. He feels that his glucose is elevated due to a recent steroid back injection earlier in the week.   He is followed by Dr. Johnsie Cancel for his cardiology care. He was last seen by Truitt Merle, NP 01/22/20 in follow up at which time he had some reports of "morning sickness" for which he was taking a PPI but otherwise was doing ok from a CV standpoint.   Past Medical History:  Diagnosis Date  . Arthritis    osteoarthritis, s/p R TKR, and digits  . CAD (coronary artery disease)    a. s/p CABG (2001)  b. s/p DES to RCA and cutting POBA to ostial PDA (2013)   c. s/p DES to SVG to OM2 (01/14/14) d. cath: 08/2015 NSTEMI w/ patent LIMA-LAD and 99% stenosis of SVG-OM w/ DES placed. CTO of SVG-RCA and SVG-D1.   . Cataract   . Chronic diastolic CHF (congestive heart failure) ([place])    a) 09/13 ECHO- LVEF 27-25%, grade 1 diastolic dysfunction, mild LA dilatation, atrial septal aneurysm, AV  mobility restricted, but no sig AS by doppler; b) 09/04/08 ECHO- LVH, ef 60%, mild AS, c. echo 08/2015: EF perserved of 55-60% with inferolateral HK. Mild AS noted.  . Chronic kidney disease, stage III (moderate) ([place])   . Chronic lower back pain   . Colon polyps   . COVID-19   . Diverticulosis   . Dyspnea 2009 since July -Sept   05/06/08-CPST-  normal effort, reduced VO2 max 20.5 /65%, reduced at 8.2/ 40%, normal breathing resetvca of 55%, submaximal heart rate response 112/77%, flattened o2 pluse response at peak exercise-12 ml/beat @ 85%, No VQ mismatch abnormalities, All c/w CIRC Limitation  . Enlarged prostate   . Esophageal stricture    a. s/p dilation spring 2010  . GERD (gastroesophageal reflux disease)   . Heart murmur   .  Hiatal hernia   . History of carpal tunnel syndrome    Bilateral  . History of kidney stones   . History of PFTs    mixed pattern on spiro. mild restn on lung volumes with near normal DLCO. Pattern can be explained by CABG scar. Fev1 2.2L/73%, ratio 68 (67), TLC 4.7/68%,RV 1.5L/55%,DLCO 79%  . Hyperlipidemia   . Hypertension   . Interstitial lung disease ([name])    NOS  . Iron deficiency anemia   . Nausea & vomiting    2018/2019  . On home oxygen therapy    2 L [place] at bedtime  . Osteoporosis   . Overweight (BMI 25.0-29.9)    BMI 29  . Peripheral neuropathy   . RA (rheumatoid arthritis) ([name])    [name]  . Seropositive rheumatoid arthritis ([place])   . Type II diabetes mellitus ([name])    diet controlled  . Wears glasses   . Wears partial dentures    upper    Past Surgical History:  Procedure Laterality Date  . BALLOON DILATION N/A 09/12/2018   Procedure: BALLOON DILATION;  Surgeon: Irene Shipper, MD;  Location: [facility];  Service: Endoscopy;  Laterality: N/A;  . BALLOON DILATION N/A 02/20/2020   Procedure: BALLOON DILATION;  Surgeon: Irene Shipper, MD;  Location: [facility];  Service: Endoscopy;  Laterality: N/A;  . BOTOX INJECTION N/A 09/12/2018   Procedure: BOTOX INJECTION;  Surgeon: Irene Shipper, MD;  Location: [facility];  Service: Endoscopy;  Laterality: N/A;  . BOTOX INJECTION N/A 11/27/2018   Procedure: BOTOX INJECTION;  Surgeon: Irene Shipper, MD;  Location: [facility];  Service: Endoscopy;  Laterality: N/A;  . BOTOX INJECTION N/A 02/20/2020   Procedure: BOTOX INJECTION;  Surgeon: Irene Shipper, MD;  Location: [facility];  Service: Endoscopy;  Laterality: N/A;  . CARDIAC CATHETERIZATION  08/2004   CP- no MI, Cath- small vessell disease   . CARDIAC CATHETERIZATION  12/31/2011   80% distal LM, 100% native LAD, LCx and RCA, 30% prox SVG-OM, SVG-D1 normal, 99% distal, 80% ostial SVG-RCA distal to graft, LIMA-LAD normal; LVEF mildly decreased with posterior basal AK    . CARDIAC CATHETERIZATION  2009   with patent grafts/notes 12/31/2011  . CARDIAC CATHETERIZATION N/A 08/13/2015   Procedure: Left Heart Cath and Cors/Grafts Angiography;  Surgeon: Sherren Mocha, MD;  Location: [facility];  Service: Cardiovascular;  Laterality: N/A;  . CATARACT EXTRACTION W/ INTRAOCULAR LENS  IMPLANT, BILATERAL Bilateral   . CHOLECYSTECTOMY OPEN  11/2003   [name]  . CORONARY ANGIOPLASTY WITH STENT PLACEMENT  01/03/2012   Successful DES to SVG-RCA and cutting balloon angioplasty ostial  PDA   .  CORONARY ANGIOPLASTY WITH STENT PLACEMENT  01/14/2014   "1"  . CORONARY ARTERY BYPASS GRAFT  11/1999   CABG X5  . CORONARY STENT PLACEMENT  02/2012   1 stent and balloon  . ESOPHAGEAL DILATION  11/27/2018   Procedure: ESOPHAGEAL DILATION;  Surgeon: Irene Shipper, MD;  Location: [facility];  Service: Endoscopy;;  . ESOPHAGOGASTRODUODENOSCOPY N/A 03/01/2017   Procedure: ESOPHAGOGASTRODUODENOSCOPY (EGD);  Surgeon: Irene Shipper, MD;  Location: [facility];  Service: Endoscopy;  Laterality: N/A;  . ESOPHAGOGASTRODUODENOSCOPY (EGD) WITH ESOPHAGEAL DILATION  2010  . ESOPHAGOGASTRODUODENOSCOPY (EGD) WITH PROPOFOL N/A 09/12/2018   Procedure: ESOPHAGOGASTRODUODENOSCOPY (EGD) WITH PROPOFOL;  Surgeon: Irene Shipper, MD;  Location: [facility];  Service: Endoscopy;  Laterality: N/A;  . ESOPHAGOGASTRODUODENOSCOPY (EGD) WITH PROPOFOL N/A 11/27/2018   Procedure: ESOPHAGOGASTRODUODENOSCOPY (EGD) WITH PROPOFOL, WITH BALLOON DILATION;  Surgeon: Irene Shipper, MD;  Location: [facility];  Service: Endoscopy;  Laterality: N/A;  . ESOPHAGOGASTRODUODENOSCOPY (EGD) WITH PROPOFOL N/A 02/20/2020   Procedure: ESOPHAGOGASTRODUODENOSCOPY (EGD) WITH PROPOFOL;  Surgeon: Irene Shipper, MD;  Location: [facility];  Service: Endoscopy;  Laterality: N/A;  . HAND SURGERY     bilateral carpal tunnel releases  . JOINT REPLACEMENT    . KNEE ARTHROSCOPY Right 2008  . LEFT AND RIGHT HEART CATHETERIZATION WITH  CORONARY ANGIOGRAM  12/31/2011   Procedure: LEFT AND RIGHT HEART CATHETERIZATION WITH CORONARY ANGIOGRAM;  Surgeon: Burnell Blanks, MD;  Location: [facility] C[facility] LAB;  Service: Cardiovascular;;  . LEFT AND RIGHT HEART CATHETERIZATION WITH CORONARY ANGIOGRAM N/A 01/14/2014   Procedure: LEFT AND RIGHT HEART CATHETERIZATION WITH CORONARY ANGIOGRAM;  Surgeon: Peter M Martinique, MD;  Location: [facility] CATH LAB;  Service: Cardiovascular;  Laterality: N/A;  . MALONEY DILATION  03/01/2017   Procedure: Venia Minks DILATION;  Surgeon: Irene Shipper, MD;  Location: [facility];  Service: Endoscopy;;  . PERCUTANEOUS CORONARY INTERVENTION-BALLOON ONLY  01/03/2012   Procedure: PERCUTANEOUS CORONARY INTERVENTION-BALLOON ONLY;  Surgeon: Peter M Martinique, MD;  Location: [facility] CATH LAB;  Service: Cardiovascular;;  . PERCUTANEOUS CORONARY STENT INTERVENTION (PCI-S)  12/31/2011   Procedure: PERCUTANEOUS CORONARY STENT INTERVENTION (PCI-S);  Surgeon: Burnell Blanks, MD;  Location: [facility] CATH LAB;  Service: Cardiovascular;;  . PERCUTANEOUS CORONARY STENT INTERVENTION (PCI-S) N/A 01/03/2012   Procedure: PERCUTANEOUS CORONARY STENT INTERVENTION (PCI-S);  Surgeon: Peter M Martinique, MD;  Location: [facility] CATH LAB;  Service: Cardiovascular;  Laterality: N/A;  . SHOULDER ARTHROSCOPY WITH OPEN ROTATOR CUFF REPAIR AND DISTAL CLAVICLE ACROMINECTOMY Left 02/27/2013   Procedure: LEFT SHOULDER ARTHROSCOPY WITH MINI OPEN ROTATOR CUFF REPAIR AND SUBACROMIAL DECOMPRESSION AND DISTAL CLAVICLE RESECTION;  Surgeon: [name], MD;  Location: [place];  Service: Orthopedics;  Laterality: Left;  . TOTAL KNEE ARTHROPLASTY Right 03/2010   [name]  . TRIGGER FINGER RELEASE Left 02/27/2013   Procedure: RELEASE TRIGGER FINGER/A-1 PULLEY;  Surgeon: [name], MD;  Location: [place];  Service: Orthopedics;  Laterality: Left;     Prior to Admission medications   Medication Sig Start Date End Date Taking? Authorizing Provider  amLODipine (NORVASC) 5  MG tablet Take 5 mg by mouth daily.  08/06/19  Yes [provider]  aspirin EC 81 MG tablet Take 81 mg by mouth at bedtime.   Yes [provider]  Blood Glucose Monitoring Suppl (ONE TOUCH ULTRA 2) w/Device KIT Use to obtain blood sugar daily. Dx Code E11.40 04/12/19  Yes [name], MD  carvedilol (COREG) 6.25 MG tablet Take 6.25 mg by mouth 2 (two) times  daily. 11/15/19  Yes [provider]  clopidogrel (PLAVIX) 75 MG tablet Take 1 tablet (75 mg total) by mouth daily. 01/22/20  Yes Burtis Junes, NP  DHA-Vitamin C-Lutein (EYE HEALTH FORMULA PO) Take 1 tablet by mouth daily.    Yes [provider]  furosemide (LASIX) 40 MG tablet Take 40 mg by mouth 4 (four) times a week. Monday, Wednesday, Friday & Sunday   Yes [provider]  glucose blood (ONE TOUCH ULTRA TEST) test strip USE TO CHECK BLOOD SUGAR ONCE A DAY Dx Code E11.40 01/29/20  Yes [name], MD  HYDROcodone-acetaminophen (NORCO) 10-325 MG tablet Take 2 tablets by mouth 2 (two) times daily as needed for severe pain. Must last 30 days. Max 4 tablets/day Patient taking differently: Take 2 tablets by mouth in the morning and at bedtime. Must last 30 days. Max 4 tablets/day 04/12/20 05/12/20 Yes Gillis Santa, MD  losartan (COZAAR) 25 MG tablet Take 25 mg by mouth at bedtime.  08/06/19  Yes [provider]  nitroGLYCERIN (NITROSTAT) 0.4 MG SL tablet DISSOLVE 1 TABLET UNDER TONGUE AS NEEDEDFOR CHEST PAIN. MAY REPEAT 5 MINUTES APART 3 TIMES IF NEEDED. IF NO RELIEF CALL 911 Patient taking differently: Place 0.4 mg under the tongue every 5 (five) minutes as needed for chest pain. 04/10/20  Yes [name], MD  ondansetron (ZOFRAN) 4 MG tablet TAKE 1 TABLET BY MOUTH EVERY 8 HOURS AS NEEDED FOR NAUSEA OR VOMITING Patient taking differently: Take 4 mg by mouth every 8 (eight) hours as needed (nausea/vomiting). 08/21/19  Yes [name], MD  OneTouch Delica Lancets 81O MISC 1 each by  In Vitro route daily. Dx Code E11.49 01/29/20  Yes [name], MD  OXYGEN Inhale into the lungs. Per pt- Uses Oxygen 2 liters at night.   Yes [provider]  pantoprazole (PROTONIX) 40 MG tablet Take 1 tablet (40 mg total) by mouth daily. 08/06/19  Yes [name], MD  polyethylene glycol (MIRALAX / GLYCOLAX) 17 g packet Take 51 g by mouth every other day.    Yes [provider]  pregabalin (LYRICA) 25 MG capsule Take 1 capsule (25 mg total) by mouth at bedtime for 20 days, THEN 2 capsules (50 mg total) at bedtime. Patient taking differently: Take 1 capsule (25 mg) by mouth at bedtime. 02/12/20 05/12/20 Yes Gillis Santa, MD  Semaglutide 3 MG TABS Take 3 mg by mouth daily before breakfast. 12/14/19  Yes [name], MD  tamsulosin (FLOMAX) 0.4 MG CAPS capsule Take 0.4 mg by mouth at bedtime.  01/31/19  Yes [provider]    Inpatient Medications: Scheduled Meds: . amLODipine  5 mg Oral Daily  . aspirin EC  81 mg Oral QHS  . carvedilol  6.25 mg Oral BID WC  . clopidogrel  75 mg Oral Daily  . enoxaparin (LOVENOX) injection  40 mg Subcutaneous Q24H  . furosemide  40 mg Oral Once per day on Sun Mon Wed Fri  . insulin aspart  0-9 Units Subcutaneous Q4H  . [START ON 04/12/2020] losartan  25 mg Oral QHS  . pantoprazole  40 mg Oral Daily  . pregabalin  25 mg Oral QHS  . tamsulosin  0.4 mg Oral QHS   Continuous Infusions:  PRN Meds: [START ON 04/12/2020] HYDROcodone-acetaminophen, nitroGLYCERIN  Allergies:    Allergies  Allergen Reactions  . Doxazosin Mesylate Other (See Comments)    dizziness  . Methocarbamol Rash    Social History:  Social History   Socioeconomic History  . Marital status: Married    Spouse name: Not on file  . Number of children: 3  . Years of education: Not on file  . Highest education level: Not on file  Occupational History  . Occupation: Designer, jewellery: RETIRED    Comment: retired  Tobacco Use  . Smoking  status: Former Smoker    Packs/day: 1.00    Years: 20.00    Pack years: 20.00    Types: Cigarettes    Quit date: 04/06/1963    Years since quitting: 57.0  . Smokeless tobacco: Former Network engineer  . Vaping Use: Never used  Substance and Sexual Activity  . Alcohol use: No    Alcohol/week: 0.0 standard drinks    Comment: 01/01/2012 "last alcohol ~ 50 yr ago"  . Drug use: No  . Sexual activity: Not Currently  Other Topics Concern  . Not on file  Social History Narrative   No living will   Requests wife as health care POA-- alternate is daughter Hassan Rowan   Discussed DNR --he requests this (done 08/29/12)   Not sure about feeding tube---but might accept for some time   Patient lives with wife and daughter in a one story home.  Has 3 children.  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   Social Determinants of Health   Financial Resource Strain: Low Risk   . Difficulty of Paying Living Expenses: Not very hard  Food Insecurity: Not on file  Transportation Needs: Not on file  Physical Activity: Not on file  Stress: Not on file  Social Connections: Not on file  Intimate Partner Violence: Not on file    Family History:   Family History  Problem Relation Age of Onset  . COPD Mother   . Heart disease Father   . Heart attack Father   . Stomach cancer Brother   . Stroke Sister   . Alcohol abuse Sister   . Colon cancer Brother 25  . Diabetes Brother   . Rectal cancer Neg Hx    Family Status:  Family Status  Relation Name Status  . Mother  Deceased at age 32       died of copd  . Father  Deceased at age 74       died of heart disease  . Brother  Deceased       died if DM coplications  . Brother  Deceased       died of colon ca  . Sister  Deceased       died of alcoholism  . Sister  Deceased  . Sister  Deceased  . Sister  Alive  . Sister  Alive  . Brother  Deceased  . Neg Hx  (Not Specified)    ROS:  Please see the history of present illness.  All  other ROS reviewed and negative.     Physical Exam/Data:   Vitals:   04/11/20 1030 04/11/20 1045 04/11/20 1100 04/11/20 1115  BP:   139/71   Pulse:   (!) 54   Resp: $Remo'16 17 17 15  'LcXqL$ Temp:      TempSrc:      SpO2:   97%    No intake or output data in the 24 hours ending 04/11/20 1306 There were no vitals filed for this visit. There is no height or weight on file to calculate BMI.   General: Elderly, NAD Neck: Negative  for carotid bruits. No JVD Lungs:Clear to ausculation bilaterally. No wheezes, rales, or rhonchi. Breathing is unlabored. Cardiovascular: RRR with S1 S2. No murmurs Abdomen: Soft, non-tender, non-distended. No obvious abdominal masses. Extremities: No edema. Radial pulses 2+ bilaterally Neuro: Alert and oriented. No focal deficits. No facial asymmetry. MAE spontaneously. Psych: Responds to questions appropriately with normal affect.     EKG:  The EKG was personally reviewed and demonstrates: 04/11/19 Telemetry:  Telemetry was personally reviewed and demonstrates: 04/11/20 SR/SB with rates in the 50-60's   Relevant CV Studies:  Echo Study Conclusions 02/03/18  - Left ventricle: The cavity size was normal. Wall thickness was  increased in a pattern of moderate LVH. Systolic function was  normal. The estimated ejection fraction was in the range of 50%  to 55%. There is akinesis of the inferior myocardium. Doppler  parameters are consistent with abnormal left ventricular  relaxation (grade 1 diastolic dysfunction).  - Aortic valve: Valve mobility was restricted. There was mild  stenosis. There was trivial regurgitation.  - Mitral valve: Calcified annulus.  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  - Pericardium, extracardiac: A trivial pericardial effusion was  identified.    Left Heart Cath and Cors/Grafts Angiography 08/2015  Conclusion   Prox RCA lesion, 100% stenosed.  LIMA .  The LIMA to LAD graft is widely patent without  stenosis.  Sequential SVG .  The sequential saphenous vein graft to OM1 and OM 2 has critical stenosis within the previously stented segment in the proximal body of the graft.  SVG .  Graft known to be totally occluded, not selectively injected  Origin lesion, 100% stenosed.  SVG .  The graft is chronically occluded  Prox Graft lesion, 100% stenosed.  Prox LAD lesion, 100% stenosed.  Ost LM lesion, 80% stenosed.  Ost 2nd Mrg to 2nd Mrg lesion, 90% stenosed.  Prox Graft to Mid Graft lesion, before 1st Mrg, 99% stenosed. Post intervention, there is a 0% residual stenosis. The lesion was previously treated with a stent (unknown type).   1. Severe native three-vessel coronary artery disease with severe left main stenosis, total occlusion of the LAD, total occlusion of the RCA, and severe native left circumflex stenosis  2. Status post aortocoronary bypass surgery with continued patency of the LIMA to LAD and continued patency of the saphenous vein graft to OM with severe stenosis in the proximal body of the graft (in-stent)  3. Chronic total occlusion of the saphenous graft RCA and saphenous vein graft to diagonal  4. Successful PCI of the saphenous vein graft OM with placement of a drug-eluting stent(treatment of in-stent restenosis)  Recommendation: dual antiplatelet therapy with aspirin and Plavix at least 12 months. Consider long-term therapy if tolerated in this patient with previous bypass grafting who has required multiple PCI procedures.  Laboratory Data:  Chemistry Recent Labs  Lab 04/10/20 1437 04/11/20 0317  NA 135 139  K 4.5 4.2  CL 99 102  CO2 21* 24  GLUCOSE 588* 264*  BUN 45* 42*  CREATININE 2.33* 1.98*  CALCIUM 9.8 10.1  GFRNONAA 27* 32*  ANIONGAP 15 13    Total Protein  Date Value Ref Range Status  06/13/2019 8.0 6.0 - 8.3 g/dL Final  05/11/2016 7.4 6.0 - 8.5 g/dL Final   Albumin  Date Value Ref Range Status  06/13/2019 4.1 3.5 - 5.2 g/dL  Final  06/13/2019 4.1 3.5 - 5.2 g/dL Final  05/11/2016 3.9 3.5 - 4.7 g/dL Final   AST  Date Value Ref Range Status  06/13/2019 14 0 - 37 U/L Final   ALT  Date Value Ref Range Status  06/13/2019 9 0 - 53 U/L Final   Alkaline Phosphatase  Date Value Ref Range Status  06/13/2019 58 39 - 117 U/L Final   Total Bilirubin  Date Value Ref Range Status  06/13/2019 0.8 0.2 - 1.2 mg/dL Final   Bilirubin Total  Date Value Ref Range Status  05/11/2016 0.3 0.0 - 1.2 mg/dL Final   Hematology Recent Labs  Lab 04/10/20 1437 04/11/20 0317  WBC 18.9* 20.3*  RBC 4.66 4.96  HGB 13.3 14.1  HCT 40.6 43.2  MCV 87.1 87.1  MCH 28.5 28.4  MCHC 32.8 32.6  RDW 13.2 13.3  PLT 191 195   Cardiac EnzymesNo results for input(s): TROPONINI in the last 168 hours. No results for input(s): TROPIPOC in the last 168 hours.  BNPNo results for input(s): BNP, PROBNP in the last 168 hours.  DDimer No results for input(s): DDIMER in the last 168 hours. TSH:  Lab Results  Component Value Date   TSH 2.100 04/08/2017   Lipids: Lab Results  Component Value Date   CHOL 209 (H) 06/13/2019   HDL 35.50 (L) 06/13/2019   LDLCALC 43 10/14/2016   LDLDIRECT 125.0 06/13/2019   TRIG 275.0 (H) 06/13/2019   CHOLHDL 6 06/13/2019   HgbA1c: Lab Results  Component Value Date   HGBA1C 8.9 (H) 04/11/2020    Radiology/Studies:  DG Chest 2 View  Result Date: 04/10/2020 CLINICAL DATA:  Acute chest pain and shortness of breath for 1 day. EXAM: CHEST - 2 VIEW COMPARISON:  04/03/2019 chest radiograph and prior studies FINDINGS: The cardiomediastinal silhouette is unremarkable. CABG changes are again noted. There is no evidence of focal airspace disease, pulmonary edema, suspicious pulmonary nodule/mass, pleural effusion, or pneumothorax. No acute bony abnormalities are identified. IMPRESSION: No active cardiopulmonary disease. Electronically Signed   By: Harmon Pier M.D.   On: 04/10/2020 14:45   DG PAIN CLINIC C-ARM 1-60  MIN NO REPORT  Result Date: 04/09/2020 Fluoro was used, but no Radiologist interpretation will be provided. Please refer to "NOTES" tab for provider progress note.  Assessment and Plan:   1. Angina with hx of complex CAD s/p remote CABG and multiple PCI/DES interventions>>>last 2017: -Pt presented to [facility] 04/11/20 with a two day hx of chest pain initially began while attempting to go to sleep on Wednesday evening. He describes the pain as anterior pressure/pain without radiation. Pain was relieved with ASA and SL NTG. He states that pain was relatively constant until SL NTG, so approximately several hours in duration. Pain similar to prior angina in the past. Since then he reports fatigue and exertional SOB. EKG showed NSR with no acute changes. HsT found to be 29>>37>>49>>68>>72. Creatinine elevated above baseline at 2.33. WBC elevated at 18.9. BS elevated at 588>>howver likely in the setting of recent steroid back injection.  -Continue ASA, carvedilol  -Would hold losartan given elevated renal function for now -Not a candidate for invasive ischemic workup at this time given renal function and leukocytosis -Given one isolated chest pain episode, would optimize medical therapy for now by considering long acting nitrates and if recurrent episodes, consider re-cath however given his advanced age, other co-morbid conditions and relatively poor renal function, would hold off as long as possible. -May benefit from non-invasive Lexiscan stress testing  -Will obtain echocardiogram   2. Uncontrolled DM2: -BG on presentation at 588>>however reports recent steroid back  injection  -Prior Hb A1c, 8.9 -SSI for glucose control while inpatient status  -Will need close OP follow up with PCP  -Consider SLGT2 inhibitor for cardiac benefit   3. HTN: -BP pn presentation found to be elevated at 162/83>>185/73>>139/71>>155/73 -On PTA amlodipine, carvedilol, MWFSunday Lasix dosing,and losartan    4.  ILD/RA: -Followed by OP rheumatology  -Not currently on immunosuppressive therapies  5. Chronic diastolic CHF: -Last echocardiogram 2019 with low normal EF at 50-55%, akinesis of the inferior myocardium, G1DD, mild AS.  -Maintained on MWFSunday Lasix $RemoveBeforeD'40mg'[id number]$   -Appears euvolemic today  -Would repeat echocardiogram this admission   6.  Aortic stenosis   Mild on echo in 2019      7  ILD: -Chronic dyspnea however reports some recent worsening. Follows with pulmonary in the OP setting, Dr. Chase Caller -Continue PRN supplemental oxygen  -Management per IM   8. Hypertriglyceridemia: -Trig this admission>>275  9. Achalasia: -s/p recent EGD 02/2020 which showed stricture treated with dilation and Botox injection -Followed by OP GI   Patient denies problems swallowing food     For questions or updates, please contact [place] [facility] Please consult [email or web address] for contact info under Cardiology/STEMI.   SignedKathyrn Drown NP-C [place] Pager: [phone] 04/11/2020 1:06 PM  Patient seen and examined   I agree with findings as noted by Tonny Branch Pt is followed by P Nishan.   Hx of significant CAD  Last intervention was 2017 for instent restenosis   Seen in clinc this fall  Doing OK   Had back injected Wed   Wed PM had CP     Yesterday felt fatigue   Today fatigue   Came to ED Note all occurred after steroid injection to back the day before.  ON exam, pt is currently comfortable Lungs are relatively clear Cardiac exam:   RRR  No S3  Gr III/VI systolic murmur LSB to base Abd is supple  Ext are without edema  EKG with T wave inversions inferiorly   Old  REcomm:   CP  Episode of pain that was relieved wth NTG  Pt with known severe dz    With severity of dz I would reocmm following clinically   Maximize medical Rx   Optimize BP control   I would not plan for invasive or ischemic eval unless fails medical Rx   Will continue to follow   Dorris Carnes MD

## 2020-04-12 DIAGNOSIS — Z9981 Dependence on supplemental oxygen: Secondary | ICD-10-CM | POA: Diagnosis not present

## 2020-04-12 DIAGNOSIS — Z8249 Family history of ischemic heart disease and other diseases of the circulatory system: Secondary | ICD-10-CM | POA: Diagnosis not present

## 2020-04-12 DIAGNOSIS — I2582 Chronic total occlusion of coronary artery: Secondary | ICD-10-CM | POA: Diagnosis present

## 2020-04-12 DIAGNOSIS — Z79899 Other long term (current) drug therapy: Secondary | ICD-10-CM | POA: Diagnosis not present

## 2020-04-12 DIAGNOSIS — G4733 Obstructive sleep apnea (adult) (pediatric): Secondary | ICD-10-CM | POA: Diagnosis present

## 2020-04-12 DIAGNOSIS — I252 Old myocardial infarction: Secondary | ICD-10-CM | POA: Diagnosis not present

## 2020-04-12 DIAGNOSIS — Z955 Presence of coronary angioplasty implant and graft: Secondary | ICD-10-CM | POA: Diagnosis not present

## 2020-04-12 DIAGNOSIS — Z87891 Personal history of nicotine dependence: Secondary | ICD-10-CM | POA: Diagnosis not present

## 2020-04-12 DIAGNOSIS — J849 Interstitial pulmonary disease, unspecified: Secondary | ICD-10-CM | POA: Diagnosis present

## 2020-04-12 DIAGNOSIS — I472 Ventricular tachycardia: Secondary | ICD-10-CM | POA: Diagnosis not present

## 2020-04-12 DIAGNOSIS — M810 Age-related osteoporosis without current pathological fracture: Secondary | ICD-10-CM | POA: Diagnosis present

## 2020-04-12 DIAGNOSIS — N179 Acute kidney failure, unspecified: Secondary | ICD-10-CM | POA: Diagnosis present

## 2020-04-12 DIAGNOSIS — R079 Chest pain, unspecified: Secondary | ICD-10-CM | POA: Diagnosis not present

## 2020-04-12 DIAGNOSIS — E1122 Type 2 diabetes mellitus with diabetic chronic kidney disease: Secondary | ICD-10-CM | POA: Diagnosis present

## 2020-04-12 DIAGNOSIS — R072 Precordial pain: Secondary | ICD-10-CM | POA: Diagnosis present

## 2020-04-12 DIAGNOSIS — I5032 Chronic diastolic (congestive) heart failure: Secondary | ICD-10-CM | POA: Diagnosis present

## 2020-04-12 DIAGNOSIS — K219 Gastro-esophageal reflux disease without esophagitis: Secondary | ICD-10-CM | POA: Diagnosis present

## 2020-04-12 DIAGNOSIS — Z7902 Long term (current) use of antithrombotics/antiplatelets: Secondary | ICD-10-CM | POA: Diagnosis not present

## 2020-04-12 DIAGNOSIS — E1165 Type 2 diabetes mellitus with hyperglycemia: Secondary | ICD-10-CM | POA: Diagnosis present

## 2020-04-12 DIAGNOSIS — Z7982 Long term (current) use of aspirin: Secondary | ICD-10-CM | POA: Diagnosis not present

## 2020-04-12 DIAGNOSIS — D72828 Other elevated white blood cell count: Secondary | ICD-10-CM | POA: Diagnosis present

## 2020-04-12 DIAGNOSIS — M069 Rheumatoid arthritis, unspecified: Secondary | ICD-10-CM | POA: Diagnosis present

## 2020-04-12 DIAGNOSIS — E1142 Type 2 diabetes mellitus with diabetic polyneuropathy: Secondary | ICD-10-CM | POA: Diagnosis present

## 2020-04-12 DIAGNOSIS — I25119 Atherosclerotic heart disease of native coronary artery with unspecified angina pectoris: Secondary | ICD-10-CM | POA: Diagnosis present

## 2020-04-12 DIAGNOSIS — Z8616 Personal history of COVID-19: Secondary | ICD-10-CM | POA: Diagnosis not present

## 2020-04-12 DIAGNOSIS — I130 Hypertensive heart and chronic kidney disease with heart failure and stage 1 through stage 4 chronic kidney disease, or unspecified chronic kidney disease: Secondary | ICD-10-CM | POA: Diagnosis present

## 2020-04-12 LAB — BASIC METABOLIC PANEL
Anion gap: 13 (ref 5–15)
BUN: 44 mg/dL — ABNORMAL HIGH (ref 8–23)
CO2: 27 mmol/L (ref 22–32)
Calcium: 9.9 mg/dL (ref 8.9–10.3)
Chloride: 101 mmol/L (ref 98–111)
Creatinine, Ser: 2.06 mg/dL — ABNORMAL HIGH (ref 0.61–1.24)
GFR, Estimated: 31 mL/min — ABNORMAL LOW (ref >60)
Glucose, Bld: 162 mg/dL — ABNORMAL HIGH (ref 70–99)
Potassium: 3.6 mmol/L (ref 3.5–5.1)
Sodium: 141 mmol/L (ref 135–145)

## 2020-04-12 LAB — GLUCOSE, CAPILLARY
Glucose-Capillary: 190 mg/dL — ABNORMAL HIGH (ref 70–99)
Glucose-Capillary: 160 mg/dL — ABNORMAL HIGH (ref 70–99)
Glucose-Capillary: 141 mg/dL — ABNORMAL HIGH (ref 70–99)
Glucose-Capillary: 212 mg/dL — ABNORMAL HIGH (ref 70–99)
Glucose-Capillary: 281 mg/dL — ABNORMAL HIGH (ref 70–99)
Glucose-Capillary: 186 mg/dL — ABNORMAL HIGH (ref 70–99)
Glucose-Capillary: 186 mg/dL — ABNORMAL HIGH (ref 70–99)

## 2020-04-12 LAB — CBC
HCT: 41.8 % (ref 39.0–52.0)
Hemoglobin: 14.1 g/dL (ref 13.0–17.0)
MCH: 28.8 pg (ref 26.0–34.0)
MCHC: 33.7 g/dL (ref 30.0–36.0)
MCV: 85.5 fL (ref 80.0–100.0)
Platelets: 182 10*3/uL (ref 150–400)
RBC: 4.89 MIL/uL (ref 4.22–5.81)
RDW: 13.4 % (ref 11.5–15.5)
WBC: 10.4 10*3/uL (ref 4.0–10.5)
nRBC: 0 % (ref 0.0–0.2)

## 2020-04-12 LAB — MAGNESIUM: Magnesium: 2.1 mg/dL (ref 1.7–2.4)

## 2020-04-12 MED ORDER — CARVEDILOL 3.125 MG PO TABS
3.1250 mg | ORAL_TABLET | Freq: Two times a day (BID) | ORAL | Status: DC
  Administered 2020-04-12: 3.125 mg via ORAL
  Filled 2020-04-12: qty 1

## 2020-04-12 MED ORDER — POTASSIUM CHLORIDE CRYS ER 20 MEQ PO TBCR
40.0000 meq | EXTENDED_RELEASE_TABLET | Freq: Once | ORAL | Status: AC
  Administered 2020-04-12: 40 meq via ORAL
  Filled 2020-04-12: qty 2

## 2020-04-12 MED ORDER — FUROSEMIDE 40 MG PO TABS: 40.0000 mg | ORAL_TABLET | ORAL | Status: DC

## 2020-04-12 MED ORDER — CARVEDILOL 6.25 MG PO TABS
6.2500 mg | ORAL_TABLET | Freq: Two times a day (BID) | ORAL | Status: DC
  Administered 2020-04-13: 6.25 mg via ORAL
  Filled 2020-04-12: qty 1

## 2020-04-12 MED ORDER — HYDRALAZINE HCL 25 MG PO TABS
25.0000 mg | ORAL_TABLET | Freq: Three times a day (TID) | ORAL | Status: DC
Start: 2020-04-12 — End: 2020-04-12
  Administered 2020-04-12: 25 mg via ORAL
  Filled 2020-04-12: qty 1

## 2020-04-12 MED ORDER — ENOXAPARIN SODIUM 30 MG/0.3ML ~~LOC~~ SOLN
30.0000 mg | SUBCUTANEOUS | Status: DC
  Administered 2020-04-13: 30 mg via SUBCUTANEOUS
  Filled 2020-04-12: qty 0.3

## 2020-04-12 MED ORDER — HYDRALAZINE HCL 25 MG PO TABS
25.0000 mg | ORAL_TABLET | Freq: Two times a day (BID) | ORAL | Status: DC
  Administered 2020-04-12 – 2020-04-13 (×2): 25 mg via ORAL
  Filled 2020-04-12 (×2): qty 1

## 2020-04-12 NOTE — Plan of Care (Signed)
  Problem: Education: Goal: Knowledge of General Education information will improve Description: Including pain rating scale, medication(s)/side effects and non-pharmacologic comfort measures 04/12/2020 0706 by Camillia Herter, RN Outcome: Progressing 04/11/2020 1812 by Camillia Herter, RN Outcome: Progressing   Problem: Health Behavior/Discharge Planning: Goal: Ability to manage health-related needs will improve 04/12/2020 0706 by Camillia Herter, RN Outcome: Progressing 04/11/2020 1812 by Camillia Herter, RN Outcome: Progressing   Problem: Clinical Measurements: Goal: Ability to maintain clinical measurements within normal limits will improve 04/12/2020 0706 by Camillia Herter, RN Outcome: Progressing 04/11/2020 1812 by Camillia Herter, RN Outcome: Progressing Goal: Will remain free from infection 04/12/2020 0706 by Camillia Herter, RN Outcome: Progressing 04/11/2020 1812 by Camillia Herter, RN Outcome: Progressing Goal: Diagnostic test results will improve 04/12/2020 0706 by Camillia Herter, RN Outcome: Progressing 04/11/2020 1812 by Camillia Herter, RN Outcome: Progressing Goal: Respiratory complications will improve 04/12/2020 0706 by Camillia Herter, RN Outcome: Progressing 04/11/2020 1812 by Camillia Herter, RN Outcome: Progressing Goal: Cardiovascular complication will be avoided 04/12/2020 0706 by Camillia Herter, RN Outcome: Progressing 04/11/2020 1812 by Camillia Herter, RN Outcome: Progressing   Problem: Activity: Goal: Risk for activity intolerance will decrease 04/12/2020 0706 by Camillia Herter, RN Outcome: Progressing 04/11/2020 1812 by Camillia Herter, RN Outcome: Progressing   Problem: Nutrition: Goal: Adequate nutrition will be maintained 04/12/2020 0706 by Camillia Herter, RN Outcome: Progressing 04/11/2020 1812 by Camillia Herter, RN Outcome: Progressing   Problem: Coping: Goal: Level of anxiety will decrease 04/12/2020 0706 by Camillia Herter, RN Outcome: Progressing 04/11/2020 1812 by Camillia Herter, RN Outcome: Progressing   Problem: Elimination: Goal: Will not experience complications related to bowel motility 04/12/2020 0706 by Camillia Herter, RN Outcome: Progressing 04/11/2020 1812 by Camillia Herter, RN Outcome: Progressing Goal: Will not experience complications related to urinary retention 04/12/2020 0706 by Camillia Herter, RN Outcome: Progressing 04/11/2020 1812 by Camillia Herter, RN Outcome: Progressing   Problem: Pain Managment: Goal: General experience of comfort will improve 04/12/2020 0706 by Camillia Herter, RN Outcome: Progressing 04/11/2020 1812 by Camillia Herter, RN Outcome: Progressing   Problem: Safety: Goal: Ability to remain free from injury will improve 04/12/2020 0706 by Camillia Herter, RN Outcome: Progressing 04/11/2020 1812 by Camillia Herter, RN Outcome: Progressing   Problem: Skin Integrity: Goal: Risk for impaired skin integrity will decrease 04/12/2020 0706 by Camillia Herter, RN Outcome: Progressing 04/11/2020 1812 by Camillia Herter, RN Outcome: Progressing

## 2020-04-12 NOTE — Progress Notes (Signed)
Progress Note  Patient Name: [name] Date of Encounter: 04/12/2020  [facility] Cardiologist: [name], MD   Subjective   Pt says he feels good  No CP  No SOB  Walked in hall  No palptiatins    No dizzienss  Inpatient Medications    Scheduled Meds: . amLODipine  5 mg Oral Daily  . aspirin EC  81 mg Oral QHS  . carvedilol  6.25 mg Oral BID WC  . clopidogrel  75 mg Oral Daily  . enoxaparin (LOVENOX) injection  40 mg Subcutaneous Q24H  . furosemide  40 mg Oral Once per day on Sun Mon Wed Fri  . insulin aspart  0-9 Units Subcutaneous Q4H  . losartan  25 mg Oral QHS  . pantoprazole  40 mg Oral Daily  . pregabalin  25 mg Oral QHS  . tamsulosin  0.4 mg Oral QHS   Continuous Infusions:  PRN Meds: hydrALAZINE, HYDROcodone-acetaminophen, nitroGLYCERIN   Vital Signs    Vitals:   04/11/20 1822 04/11/20 1955 04/12/20 0016 04/12/20 0528  BP: (!) 169/85 (!) 162/7 119/69 (!) 154/79  Pulse: 70  (!) 56 (!) 55  Resp: 18 18 18 18   Temp: 97.8 F (36.6 C) 98.2 F (36.8 C) 97.8 F (36.6 C) (!) 97.3 F (36.3 C)  TempSrc: Oral Oral Oral Oral  SpO2: 100%  95% 97%  Weight:    89.1 kg  Height:    6' (1.829 m)   No intake or output data in the 24 hours ending 04/12/20 0545 Last 3 Weights 04/12/2020 04/09/2020 02/26/2020  Weight (lbs) 196 lb 6.9 oz 201 lb 197 lb  Weight (kg) 89.1 kg 91.173 kg 89.359 kg      Telemetry    SR   22 beat NSVT  And 17 beat NSVT - Personally Reviewed  ECG    NO new  - Personally Reviewed  Physical Exam   GEN: No acute distress.   Neck: No JVD Cardiac: RRR, no murmurs, rubs, or gallops.  Respiratory: Clear to auscultation bilaterally. GI: Soft, nontender, non-distended  MS: No edema; No deformity. Neuro:  Nonfocal  Psych: Normal affect   Labs    High Sensitivity Troponin:   Recent Labs  Lab 04/10/20 2122 04/11/20 0317 04/11/20 1250 04/11/20 1450 04/11/20 1807  TROPONINIHS 49* 68* 72* 47* 71*      Chemistry Recent Labs   Lab 04/10/20 1437 04/11/20 0317  NA 135 139  K 4.5 4.2  CL 99 102  CO2 21* 24  GLUCOSE 588* 264*  BUN 45* 42*  CREATININE 2.33* 1.98*  CALCIUM 9.8 10.1  GFRNONAA 27* 32*  ANIONGAP 15 13     Hematology Recent Labs  Lab 04/10/20 1437 04/11/20 0317  WBC 18.9* 20.3*  RBC 4.66 4.96  HGB 13.3 14.1  HCT 40.6 43.2  MCV 87.1 87.1  MCH 28.5 28.4  MCHC 32.8 32.6  RDW 13.2 13.3  PLT 191 195    BNPNo results for input(s): BNP, PROBNP in the last 168 hours.   DDimer No results for input(s): DDIMER in the last 168 hours.   Radiology    DG Chest 2 View  Result Date: 04/10/2020 CLINICAL DATA:  Acute chest pain and shortness of breath for 1 day. EXAM: CHEST - 2 VIEW COMPARISON:  04/03/2019 chest radiograph and prior studies FINDINGS: The cardiomediastinal silhouette is unremarkable. CABG changes are again noted. There is no evidence of focal airspace disease, pulmonary edema, suspicious pulmonary nodule/mass, pleural effusion, or  pneumothorax. No acute bony abnormalities are identified. IMPRESSION: No active cardiopulmonary disease. Electronically Signed   By: Margarette Canada M.D.   On: 04/10/2020 14:45   ECHOCARDIOGRAM COMPLETE  Result Date: 04/11/2020    ECHOCARDIOGRAM REPORT   Patient Name:   [name] Date of Exam: 04/11/2020 Medical Rec #:  [id number]       Height:       72.0 in Accession #:    [id number]      Weight:       201.0 lb Date of Birth:  1931-04-09       BSA:          2.135 m Patient Age:    87 years        BP:           155/73 mmHg Patient Gender: M               HR:           54 bpm. Exam Location:  Inpatient Procedure: 2D Echo Indications:    chest pain  History:        Patient has prior history of Echocardiogram examinations, most                 recent 01/26/2018. Chronic kidney disease. Interstitial lung                 disease., Aortic Valve Disease; Risk Factors:Sleep Apnea,                 Diabetes and Hypertension.  Sonographer:    Johny Chess Referring Phys:  [place]  1. Left ventricular ejection fraction, by estimation, is 55 to 60%. The left ventricle has normal function. The left ventricle has no regional wall motion abnormalities. There is mild left ventricular hypertrophy. Left ventricular diastolic parameters are consistent with Grade I diastolic dysfunction (impaired relaxation).  2. Right ventricular systolic function is mildly reduced. The right ventricular size is normal. There is normal pulmonary artery systolic pressure. The estimated right ventricular systolic pressure is 27.0 mmHg.  3. Left atrial size was mildly dilated.  4. The mitral valve is normal in structure. Trivial mitral valve regurgitation. No evidence of mitral stenosis.  5. The aortic valve is tricuspid. Aortic valve regurgitation is trivial. Mild aortic valve stenosis. Aortic valve mean gradient measures 14.0 mmHg.  6. The inferior vena cava is normal in size with greater than 50% respiratory variability, suggesting right atrial pressure of 3 mmHg. FINDINGS  Left Ventricle: Left ventricular ejection fraction, by estimation, is 55 to 60%. The left ventricle has normal function. The left ventricle has no regional wall motion abnormalities. The left ventricular internal cavity size was normal in size. There is  mild left ventricular hypertrophy. Left ventricular diastolic parameters are consistent with Grade I diastolic dysfunction (impaired relaxation). Right Ventricle: The right ventricular size is normal. No increase in right ventricular wall thickness. Right ventricular systolic function is mildly reduced. There is normal pulmonary artery systolic pressure. The tricuspid regurgitant velocity is 2.47 m/s, and with an assumed right atrial pressure of 3 mmHg, the estimated right ventricular systolic pressure is 62.3 mmHg. Left Atrium: Left atrial size was mildly dilated. Right Atrium: Right atrial size was normal in size. Pericardium: There is no evidence of pericardial  effusion. Mitral Valve: The mitral valve is normal in structure. There is mild calcification of the mitral valve leaflet(s). Mild mitral annular calcification. Trivial mitral valve regurgitation. No evidence of  mitral valve stenosis. Tricuspid Valve: The tricuspid valve is normal in structure. Tricuspid valve regurgitation is trivial. Aortic Valve: The aortic valve is tricuspid. Aortic valve regurgitation is trivial. Mild aortic stenosis is present. Aortic valve mean gradient measures 14.0 mmHg. Aortic valve peak gradient measures 22.2 mmHg. Aortic valve area, by VTI measures 1.22 cm. Pulmonic Valve: The pulmonic valve was normal in structure. Pulmonic valve regurgitation is trivial. Aorta: The aortic root is normal in size and structure. Venous: The inferior vena cava is normal in size with greater than 50% respiratory variability, suggesting right atrial pressure of 3 mmHg. IAS/Shunts: No atrial level shunt detected by color flow Doppler.  LEFT VENTRICLE PLAX 2D LVIDd:         4.90 cm  Diastology LVIDs:         3.40 cm  LV e' medial:    5.66 cm/s LV PW:         1.20 cm  LV E/e' medial:  12.2 LV IVS:        1.00 cm  LV e' lateral:   9.36 cm/s LVOT diam:     2.20 cm  LV E/e' lateral: 7.4 LV SV:         72 LV SV Index:   34 LVOT Area:     3.80 cm  RIGHT VENTRICLE             IVC RV S prime:     10.00 cm/s  IVC diam: 1.50 cm TAPSE (M-mode): 1.2 cm LEFT ATRIUM             Index       RIGHT ATRIUM           Index LA diam:        4.10 cm 1.92 cm/m  RA Area:     12.70 cm LA Vol (A2C):   65.0 ml 30.44 ml/m RA Volume:   29.60 ml  13.86 ml/m LA Vol (A4C):   43.7 ml 20.47 ml/m LA Biplane Vol: 54.5 ml 25.53 ml/m  AORTIC VALVE AV Area (Vmax):    1.20 cm AV Area (Vmean):   1.15 cm AV Area (VTI):     1.22 cm AV Vmax:           235.50 cm/s AV Vmean:          169.500 cm/s AV VTI:            0.587 m AV Peak Grad:      22.2 mmHg AV Mean Grad:      14.0 mmHg LVOT Vmax:         74.45 cm/s LVOT Vmean:        51.200 cm/s LVOT  VTI:          0.189 m LVOT/AV VTI ratio: 0.32  AORTA Ao Root diam: 3.20 cm MITRAL VALVE               TRICUSPID VALVE MV Area (PHT): 2.00 cm    TR Peak grad:   24.4 mmHg MV Decel Time: 380 msec    TR Vmax:        247.00 cm/s MV E velocity: 69.00 cm/s MV A velocity: 94.30 cm/s  SHUNTS MV E/A ratio:  0.73        Systemic VTI:  0.19 m                            Systemic Diam: 2.20 cm [employer]  MD Electronically signed by Loralie Champagne MD Signature Date/Time: 04/11/2020/5:58:20 PM    Final     Cardiac Studies   Echo   04/11/20  Left ventricular ejection fraction, by estimation, is 55 to 60%. The left ventricle has normal function. The left ventricle has no regional wall motion abnormalities. There is mild left ventricular hypertrophy. Left ventricular diastolic parameters are consistent with Grade I diastolic dysfunction (impaired relaxation). 2. Right ventricular systolic function is mildly reduced. The right ventricular size is normal. There is normal pulmonary artery systolic pressure. The estimated right ventricular systolic pressure is 86.5 mmHg. 3. Left atrial size was mildly dilated. 4. The mitral valve is normal in structure. Trivial mitral valve regurgitation. No evidence of mitral stenosis. 5. The aortic valve is tricuspid. Aortic valve regurgitation is trivial. Mild aortic valve stenosis. Aortic valve mean gradient measures 14.0 mmHg. 6. The inferior vena cava is normal in size with greater than 50% respiratory variability, suggesting right atrial pressure of 3 mmHg. EchoStudy Conclusions10/2019  - Left ventricle: The cavity size was normal. Wall thickness was  increased in a pattern of moderate LVH. Systolic function was  normal. The estimated ejection fraction was in the range of 50%  to 55%. There is akinesis of the inferior myocardium. Doppler  parameters are consistent with abnormal left ventricular  relaxation (grade 1 diastolic dysfunction).  - Aortic valve:  Valve mobility was restricted. There was mild  stenosis. There was trivial regurgitation.  - Mitral valve: Calcified annulus.  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  - Pericardium, extracardiac: A trivial pericardial effusion was  identified.   Left Heart Cath and Cors/Grafts Angiography5/2017  Conclusion   Prox RCA lesion, 100% stenosed.  LIMA .  The LIMA to LAD graft is widely patent without stenosis.  Sequential SVG .  The sequential saphenous vein graft to OM1 and OM 2 has critical stenosis within the previously stented segment in the proximal body of the graft.  SVG .  Graft known to be totally occluded, not selectively injected  Origin lesion, 100% stenosed.  SVG .  The graft is chronically occluded  Prox Graft lesion, 100% stenosed.  Prox LAD lesion, 100% stenosed.  Ost LM lesion, 80% stenosed.  Ost 2nd Mrg to 2nd Mrg lesion, 90% stenosed.  Prox Graft to Mid Graft lesion, before 1st Mrg, 99% stenosed. Post intervention, there is a 0% residual stenosis. The lesion was previously treated with a stent (unknown type).   1. Severe native three-vessel coronary artery disease with severe left main stenosis, total occlusion of the LAD, total occlusion of the RCA, and severe native left circumflex stenosis  2. Status post aortocoronary bypass surgery with continued patency of the LIMA to LAD and continued patency of the saphenous vein graft to OM with severe stenosis in the proximal body of the graft (in-stent)  3. Chronic total occlusion of the saphenous graft RCA and saphenous vein graft to diagonal  4. Successful PCI of the saphenous vein graft OM with placement of a drug-eluting stent(treatment of in-stent restenosis)  Recommendation: dual antiplatelet therapy with aspirin and Plavix at least 12 months. Consider long-term therapy if tolerated in this patient with previous bypass grafting who has required multiple PCI  procedures.     Patient Profile   TAYT MOYERS is a 87 y.o. male with a hx of CAD s/p CABG (2001) with prior DES to RCA and cutting balloon to ostial PDA (2013); DES to SVG to OM2 (2015); DES to  SVG to 1st Mg, 2nd Mg (2017) with known CTO of SVG to RCA and SVG to D1 on most recent [facility] 2017. Also with a hx of ILD, RA not presently on immunosuppressants, DM2, HTN, HLD, mild AS per echo in 2019 and achalasia with recent EGD showing stricturing 02/2020 who is being seen today for the evaluation of chest pain at the request of Dr. Nevada Crane.   Assessment & Plan    1  Chest pain/ fatigue  Pt feeling better   Triv elevation of trop  No real trend  Known CAD   Feels good   Note nonsustained VT   Normal LV funciton   ON b blocker  Dose pulled down some because HR low  Will increase back to aseline      2  HTN  BP has been high   QUes if reltated to steroid injection he got a couple days ago (along with WBC) Note that changes have been made with meds   Need to be cautious with his CAD    NOte hydralzaine added 25 bid    Continue Coreg   WIll increae dose back to 6.25 bid  COntinue lasix but change to MWF  3  AS  Mild on last echo    4  Pulm  Hx interstitial lung dz   Follows in rheum  5  INcrease WBC   Repeat today     Pt wants to go home   I would watch today for BP and symptoms   For questions or updates, please contact [place] Please consult [email or web address] for contact info under        Signed, Dorris Carnes, MD  04/12/2020, 5:45 AM

## 2020-04-12 NOTE — Evaluation (Addendum)
Physical Therapy Evaluation Patient Details Name: [name] MRN: [id number] DOB: [birth year] Today's Date: 04/12/2020   History of Present Illness  [name] is a 87 y.o. male with a history of CAD status post CABG and stenting, interstitial lung disease and rheumatoid arthritis presently not on any immunosuppressants, diabetes mellitus type 2, hypertension, mild aortic stenosis and chronic pain who had received recently epidural discharged about 2 days ago started experiencing chest pressure over the last 24 hours which has been gradually persistent even at rest.   Clinical Impression  Pt fully participated in session. Pt I prior to admission without use of AD. Pt has wife and daughter at home 24 hrs to assist. Pt I with bed mobility and S during ambulation due to mild balance deficits. Pt with drop in BP but not symptomatic. Pt will benefit from skilled PT while in the acute setting to improve balance and gait to decrease risk of falling prior to discharge.   Orthostatic BPs  Supine 156/80  Sitting 143/70  Standing 115/57      Follow Up Recommendations No PT follow up    Equipment Recommendations  None recommended by PT    Recommendations for Other Services       Precautions / Restrictions Precautions Precautions: Fall Restrictions Weight Bearing Restrictions: No      Mobility  Bed Mobility Overal bed mobility: Independent                  Transfers Overall transfer level: Independent Equipment used: None                Ambulation/Gait Ambulation/Gait assistance: Supervision;Min guard Gait Distance (Feet): 150 Feet Assistive device: None       General Gait Details: pt with 1 LOB initially requiring min guard, pt progressed to ambulating with S. pt stated he felt slightly off balance compared to normal  Stairs            Wheelchair Mobility    Modified Rankin (Stroke Patients Only)       Balance Overall balance assessment:  Mild deficits observed, not formally tested                                           Pertinent Vitals/Pain Pain Assessment: No/denies pain    Home Living Family/patient expects to be discharged to:: Private residence Living Arrangements: Spouse/significant other;Children Available Help at Discharge: Family;Available 24 hours/day Type of Home: House Home Access: Stairs to enter Entrance Stairs-Rails: Left Entrance Stairs-Number of Steps: 2   Home Equipment: [place] - 4 wheels;Cane - single point;Grab bars - tub/shower;Grab bars - toilet      Prior Function Level of Independence: Independent               Hand Dominance   Dominant Hand: Right    Extremity/Trunk Assessment   Upper Extremity Assessment Upper Extremity Assessment: Overall WFL for tasks assessed    Lower Extremity Assessment Lower Extremity Assessment: Overall WFL for tasks assessed       Communication   Communication: No difficulties  Cognition  cognition WFL pt flat and slightly HOH requiring therapist to repeat questions  General Comments  education to patient to use RW at home initially due to mild balance deficits and to use when outside to decrease risk of falling, pt verbalized understanding    Exercises     Assessment/Plan    PT Assessment Patient needs continued PT services  PT Problem List Decreased balance       PT Treatment Interventions Gait training;Balance training    PT Goals (Current goals can be found in the Care Plan section)  Acute Rehab PT Goals Patient Stated Goal: I want to go home this afternoon PT Goal Formulation: With patient Time For Goal Achievement: 04/26/20 Potential to Achieve Goals: Good    Frequency Min 3X/week   Barriers to discharge        Co-evaluation               AM-PAC PT "6 Clicks" Mobility  Outcome Measure Help needed turning from your back to your  side while in a flat bed without using bedrails?: None Help needed moving from lying on your back to sitting on the side of a flat bed without using bedrails?: None Help needed moving to and from a bed to a chair (including a wheelchair)?: None Help needed standing up from a chair using your arms (e.g., wheelchair or bedside chair)?: None Help needed to walk in hospital room?: A Little Help needed climbing 3-5 steps with a railing? : A Little 6 Click Score: 22    End of Session Equipment Utilized During Treatment: Gait belt Activity Tolerance: Patient tolerated treatment well Patient left: in bed;with call bell/phone within reach;with bed alarm set Nurse Communication: Mobility status PT Visit Diagnosis: Unsteadiness on feet (R26.81)    Time: 5573-2202 PT Time Calculation (min) (ACUTE ONLY): 18 min   Charges:   PT Evaluation $PT Eval Low Complexity: 1 Low          Lyanne Co, DPT Acute Rehabilitation Services [phone]  [place] 04/12/2020, 8:42 AM

## 2020-04-12 NOTE — Progress Notes (Signed)
PROGRESS NOTE  [name] QIO:[id number] DOB: 1932/03/22 DOA: 04/10/2020 PCP: [name], MD  HPI/Recap of past 24 hours: [place] a 87 y.o.malewithhistory of CAD status post CABG and stenting, interstitial lung disease and rheumatoid arthritis presently not on any immunosuppressants, diabetes mellitus type 2, hypertension, mild aortic stenosis and chronic pain who recently received epidural steroid injection about 2 days prior to his presentation who presented with complaints of chest pressure over the last 24 hours which has been gradually even at rest.  Denies any associated shortness of breath. Pain is mostly in the lower part of the sternum. No GI symptoms.  ED Course:Improved with sublingual nitroglycerin. EKG shows normal sinus rhythm high sensitive troponin was mildly elevated and is showing a small increasing trend. Seen by cardiology.  Of note his serum glucose was 588 and his BP was elevated.    04/12/20:  Patient was seen and examined at his bedside.  He denies any chest pain or dyspnea at rest at the time of this visit however his BP has been markedly elevated.  Likely in the setting of recent spinal steroid injection.  He is currently undergoing adjustment of his cardiac medications.  Requires close monitoring to avoid any adverse effects or overcorrection of his BP.  Assessment/Plan: Principal Problem:   Chest pain Active Problems:   Familial multiple lipoprotein-type hyperlipidemia   Obstructive sleep apnea   Essential hypertension   Chronic diastolic heart failure ([name])   ILD (interstitial lung disease) ([name])   CKD (chronic kidney disease) stage 4, GFR 15-29 ml/min ([name])   DM (diabetes mellitus) type II controlled, neurological manifestation ([name])   Chest pain in the setting of known coronary artery disease Patient presented with complaints of chest pain after receiving spinal steroid injection 2 days prior. He has a history coronary artery  disease status post CABG, severe native three-vessel coronary artery disease including LAD, mild aortic stenosis. Suspect triggered by recent spinal steroid injection causing his blood pressure and blood sugar to spike His troponin as well slightly elevated peaked at 72 then trended down No evidence of acute ischemia on twelve-lead EKG. His chest pain has resolved on the day of this visit. Appreciate cardiology's assistance  Uncontrolled hypertension in the setting of recent spinal steroid injections BP is not at goal BP/cardiac medications medications are being adjusted. Appreciate cardiology's assistance Continue to closely monitor blood pressure  Nonsustained V. Tach This morning 22-runs of V. tach, nonsustained Coreg dose increased by cardiology 6.25 mg twice daily. Received a dose of p.o. potassium supplement for goal K of greater than 4.0 Magnesium 2.1  Coronary artery disease status post CABG Continue aspirin and Plavix Denies any anginal symptoms at the time of this exam.  Chronic diastolic CHF Last 2D echo done on 04/11/2020 shows normal LVEF 55 to 60% with grade 1 diastolic dysfunction Obtain BNP Currently on p.o. Lasix 40 mg Monday Wednesday Friday Start strict I's and O's and daily weight.  Type 2 diabetes with hyperglycemia in the setting of recent steroid injection. Last hemoglobin A1c 8.9 on 04/11/2020 Continue insulin sliding scale  AKI on CKD 3B Baseline creatinine appears to be 1.9 with GFR of 32 Presented with creatinine of 2.3 with GFR of 27 Monitor urine output Losartan on hold due to AKI. Avoid nephrotoxins Daily renal panel  GERD Continue home PPI  Diabetic polyneuropathy Continue home Lyrica  BPH Continue home Flomax Monitor urine output    Code Status: Full code  Family Communication: None  at bedside  Disposition Plan: Likely will DC tomorrow on 04/13/20   Consultants:  Cardiology  Procedures:  2D  echo  Antimicrobials:  None  DVT prophylaxis: Subcu Lovenox daily  Status is: Inpatient    Dispo:  Patient From: Home  Planned Disposition: Home  Expected discharge date: 04/13/2020  Medically stable for discharge: No, ongoing management of uncontrolled hypertension.         Objective: Vitals:   04/12/20 0016 04/12/20 0528 04/12/20 0738 04/12/20 0822  BP: 119/69 (!) 154/79  (!) 171/77  Pulse: (!) 56 (!) 55  (!) 59  Resp: 18 18 17    Temp: 97.8 F (36.6 C) (!) 97.3 F (36.3 C) 97.9 F (36.6 C)   TempSrc: Oral Oral Oral   SpO2: 95% 97%  94%  Weight:  89.1 kg    Height:  6' (1.829 m)     No intake or output data in the 24 hours ending 04/12/20 1016 Filed Weights   04/12/20 0528  Weight: 89.1 kg    Exam:  . General: 87 y.o. year-old male well developed well nourished in no acute distress.  Alert and oriented x3. . Cardiovascular: Regular rate and rhythm with no rubs or gallops.  No thyromegaly or JVD noted.   [name] Respiratory: Clear to auscultation with no wheezes or rales. Good inspiratory effort. . Abdomen: Soft nontender nondistended with normal bowel sounds x4 quadrants. . Musculoskeletal: No lower extremity edema. 2/4 pulses in all 4 extremities. . Skin: No ulcerative lesions noted or rashes . Psychiatry: Mood is appropriate for condition and setting   Data Reviewed: CBC: Recent Labs  Lab 04/10/20 1437 04/11/20 0317  WBC 18.9* 20.3*  HGB 13.3 14.1  HCT 40.6 43.2  MCV 87.1 87.1  PLT 191 425   Basic Metabolic Panel: Recent Labs  Lab 04/10/20 1437 04/11/20 0317 04/12/20 0623  NA 135 139 141  K 4.5 4.2 3.6  CL 99 102 101  CO2 21* 24 27  GLUCOSE 588* 264* 162*  BUN 45* 42* 44*  CREATININE 2.33* 1.98* 2.06*  CALCIUM 9.8 10.1 9.9  MG  --   --  2.1   GFR: Estimated Creatinine Clearance: 28.3 mL/min (A) (by C-G formula based on SCr of 2.06 mg/dL (H)). Liver Function Tests: No results for input(s): AST, ALT, ALKPHOS, BILITOT, PROT, ALBUMIN in  the last 168 hours. No results for input(s): LIPASE, AMYLASE in the last 168 hours. No results for input(s): AMMONIA in the last 168 hours. Coagulation Profile: No results for input(s): INR, PROTIME in the last 168 hours. Cardiac Enzymes: No results for input(s): CKTOTAL, CKMB, CKMBINDEX, TROPONINI in the last 168 hours. BNP (last 3 results) No results for input(s): PROBNP in the last 8760 hours. HbA1C: Recent Labs    04/11/20 0317  HGBA1C 8.9*   CBG: Recent Labs  Lab 04/11/20 1640 04/11/20 2241 04/12/20 0229 04/12/20 0526 04/12/20 0801  GLUCAP 238* 199* 190* 160* 141*   Lipid Profile: No results for input(s): CHOL, HDL, LDLCALC, TRIG, CHOLHDL, LDLDIRECT in the last 72 hours. Thyroid Function Tests: No results for input(s): TSH, T4TOTAL, FREET4, T3FREE, THYROIDAB in the last 72 hours. Anemia Panel: No results for input(s): VITAMINB12, FOLATE, FERRITIN, TIBC, IRON, RETICCTPCT in the last 72 hours. Urine analysis:    Component Value Date/Time   COLORURINE YELLOW 02/19/2013 [place] 02/19/2013 1037   LABSPEC 1.020 02/19/2013 1037   PHURINE 5.0 02/19/2013 1037   GLUCOSEU NEGATIVE 02/19/2013 1037   HGBUR SMALL (A) 02/19/2013  [place] 02/19/2013 1037   KETONESUR NEGATIVE 02/19/2013 1037   PROTEINUR 30 (A) 02/19/2013 1037   UROBILINOGEN 0.2 02/19/2013 1037   NITRITE NEGATIVE 02/19/2013 1037   LEUKOCYTESUR NEGATIVE 02/19/2013 1037   Sepsis Labs: @LABRCNTIP (procalcitonin:4,lacticidven:4)  ) Recent Results (from the past 240 hour(s))  SARS CORONAVIRUS 2 (TAT 6-24 HRS) Nasopharyngeal Nasopharyngeal Swab     Status: None   Collection Time: 04/11/20  2:40 AM   Specimen: Nasopharyngeal Swab  Result Value Ref Range Status   SARS Coronavirus 2 NEGATIVE NEGATIVE Final    Comment: (NOTE) SARS-CoV-2 target nucleic acids are NOT DETECTED.  The SARS-CoV-2 RNA is generally detectable in upper and lower respiratory specimens during the acute  phase of infection. Negative results do not preclude SARS-CoV-2 infection, do not rule out co-infections with other pathogens, and should not be used as the sole basis for treatment or other patient management decisions. Negative results must be combined with clinical observations, patient history, and epidemiological information. The expected result is Negative.  Fact Sheet for Patients: SugarRoll.be  Fact Sheet for Healthcare Providers: https://www.woods-mathews.com/  This test is not yet approved or cleared by the [place] FDA and  has been authorized for detection and/or diagnosis of SARS-CoV-2 by FDA under an Emergency Use Authorization (EUA). This EUA will remain  in effect (meaning this test can be used) for the duration of the COVID-19 declaration under Se ction 564(b)(1) of the Act, 21 U.S.C. section 360bbb-3(b)(1), unless the authorization is terminated or revoked sooner.  Performed at [facility], [place] [address], [place], [place] [address]       Studies: ECHOCARDIOGRAM COMPLETE  Result Date: 04/11/2020    ECHOCARDIOGRAM REPORT   Patient Name:   [name] Date of Exam: 04/11/2020 Medical Rec #:  [id number]       Height:       72.0 in Accession #:    [id number]      Weight:       201.0 lb Date of Birth:  1932/02/13       BSA:          2.135 m Patient Age:    89 years        BP:           155/73 mmHg Patient Gender: M               HR:           54 bpm. Exam Location:  Inpatient Procedure: 2D Echo Indications:    chest pain  History:        Patient has prior history of Echocardiogram examinations, most                 recent 01/26/2018. Chronic kidney disease. Interstitial lung                 disease., Aortic Valve Disease; Risk Factors:Sleep Apnea,                 Diabetes and Hypertension.  Sonographer:    Johny Chess Referring Phys: [place]  1. Left ventricular ejection fraction, by  estimation, is 55 to 60%. The left ventricle has normal function. The left ventricle has no regional wall motion abnormalities. There is mild left ventricular hypertrophy. Left ventricular diastolic parameters are consistent with Grade I diastolic dysfunction (impaired relaxation).  2. Right ventricular systolic function is mildly reduced. The right ventricular size is normal. There is normal  pulmonary artery systolic pressure. The estimated right ventricular systolic pressure is 13.0 mmHg.  3. Left atrial size was mildly dilated.  4. The mitral valve is normal in structure. Trivial mitral valve regurgitation. No evidence of mitral stenosis.  5. The aortic valve is tricuspid. Aortic valve regurgitation is trivial. Mild aortic valve stenosis. Aortic valve mean gradient measures 14.0 mmHg.  6. The inferior vena cava is normal in size with greater than 50% respiratory variability, suggesting right atrial pressure of 3 mmHg. FINDINGS  Left Ventricle: Left ventricular ejection fraction, by estimation, is 55 to 60%. The left ventricle has normal function. The left ventricle has no regional wall motion abnormalities. The left ventricular internal cavity size was normal in size. There is  mild left ventricular hypertrophy. Left ventricular diastolic parameters are consistent with Grade I diastolic dysfunction (impaired relaxation). Right Ventricle: The right ventricular size is normal. No increase in right ventricular wall thickness. Right ventricular systolic function is mildly reduced. There is normal pulmonary artery systolic pressure. The tricuspid regurgitant velocity is 2.47 m/s, and with an assumed right atrial pressure of 3 mmHg, the estimated right ventricular systolic pressure is 86.5 mmHg. Left Atrium: Left atrial size was mildly dilated. Right Atrium: Right atrial size was normal in size. Pericardium: There is no evidence of pericardial effusion. Mitral Valve: The mitral valve is normal in structure. There is  mild calcification of the mitral valve leaflet(s). Mild mitral annular calcification. Trivial mitral valve regurgitation. No evidence of mitral valve stenosis. Tricuspid Valve: The tricuspid valve is normal in structure. Tricuspid valve regurgitation is trivial. Aortic Valve: The aortic valve is tricuspid. Aortic valve regurgitation is trivial. Mild aortic stenosis is present. Aortic valve mean gradient measures 14.0 mmHg. Aortic valve peak gradient measures 22.2 mmHg. Aortic valve area, by VTI measures 1.22 cm. Pulmonic Valve: The pulmonic valve was normal in structure. Pulmonic valve regurgitation is trivial. Aorta: The aortic root is normal in size and structure. Venous: The inferior vena cava is normal in size with greater than 50% respiratory variability, suggesting right atrial pressure of 3 mmHg. IAS/Shunts: No atrial level shunt detected by color flow Doppler.  LEFT VENTRICLE PLAX 2D LVIDd:         4.90 cm  Diastology LVIDs:         3.40 cm  LV e' medial:    5.66 cm/s LV PW:         1.20 cm  LV E/e' medial:  12.2 LV IVS:        1.00 cm  LV e' lateral:   9.36 cm/s LVOT diam:     2.20 cm  LV E/e' lateral: 7.4 LV SV:         72 LV SV Index:   34 LVOT Area:     3.80 cm  RIGHT VENTRICLE             IVC RV S prime:     10.00 cm/s  IVC diam: 1.50 cm TAPSE (M-mode): 1.2 cm LEFT ATRIUM             Index       RIGHT ATRIUM           Index LA diam:        4.10 cm 1.92 cm/m  RA Area:     12.70 cm LA Vol (A2C):   65.0 ml 30.44 ml/m RA Volume:   29.60 ml  13.86 ml/m LA Vol (A4C):   43.7 ml 20.47 ml/m LA Biplane Vol: 54.5  ml 25.53 ml/m  AORTIC VALVE AV Area (Vmax):    1.20 cm AV Area (Vmean):   1.15 cm AV Area (VTI):     1.22 cm AV Vmax:           235.50 cm/s AV Vmean:          169.500 cm/s AV VTI:            0.587 m AV Peak Grad:      22.2 mmHg AV Mean Grad:      14.0 mmHg LVOT Vmax:         74.45 cm/s LVOT Vmean:        51.200 cm/s LVOT VTI:          0.189 m LVOT/AV VTI ratio: 0.32  AORTA Ao Root diam: 3.20 cm  MITRAL VALVE               TRICUSPID VALVE MV Area (PHT): 2.00 cm    TR Peak grad:   24.4 mmHg MV Decel Time: 380 msec    TR Vmax:        247.00 cm/s MV E velocity: 69.00 cm/s MV A velocity: 94.30 cm/s  SHUNTS MV E/A ratio:  0.73        Systemic VTI:  0.19 m                            Systemic Diam: 2.20 cm Loralie Champagne MD Electronically signed by Loralie Champagne MD Signature Date/Time: 04/11/2020/5:58:20 PM    Final     Scheduled Meds: . amLODipine  5 mg Oral Daily  . aspirin EC  81 mg Oral QHS  . [START ON 04/13/2020] carvedilol  6.25 mg Oral BID WC  . clopidogrel  75 mg Oral Daily  . enoxaparin (LOVENOX) injection  40 mg Subcutaneous Q24H  . [START ON 04/14/2020] furosemide  40 mg Oral Q M,W,F  . hydrALAZINE  25 mg Oral BID  . insulin aspart  0-9 Units Subcutaneous Q4H  . pantoprazole  40 mg Oral Daily  . pregabalin  25 mg Oral QHS  . tamsulosin  0.4 mg Oral QHS    Continuous Infusions:   LOS: 0 days     Kayleen Memos, MD [facility] Pager [phone]  If 7PM-7AM, please contact night-coverage [email or web address] Password [facility] 04/12/2020, 10:16 AM

## 2020-04-12 NOTE — Progress Notes (Signed)
Pt had 22 beats of VT  K+ 3.6 this am will check Mg+ , his coreg was held for HR in low 50s will decrease coreg to 3.125 mg for now until seen on rounds.

## 2020-04-13 DIAGNOSIS — I5032 Chronic diastolic (congestive) heart failure: Secondary | ICD-10-CM | POA: Diagnosis not present

## 2020-04-13 DIAGNOSIS — R079 Chest pain, unspecified: Secondary | ICD-10-CM | POA: Diagnosis not present

## 2020-04-13 LAB — BASIC METABOLIC PANEL
Anion gap: 9 (ref 5–15)
BUN: 39 mg/dL — ABNORMAL HIGH (ref 8–23)
CO2: 27 mmol/L (ref 22–32)
Calcium: 9.4 mg/dL (ref 8.9–10.3)
Chloride: 102 mmol/L (ref 98–111)
Creatinine, Ser: 2 mg/dL — ABNORMAL HIGH (ref 0.61–1.24)
GFR, Estimated: 32 mL/min — ABNORMAL LOW (ref >60)
Glucose, Bld: 211 mg/dL — ABNORMAL HIGH (ref 70–99)
Potassium: 4.2 mmol/L (ref 3.5–5.1)
Sodium: 138 mmol/L (ref 135–145)

## 2020-04-13 LAB — GLUCOSE, CAPILLARY
Glucose-Capillary: 179 mg/dL — ABNORMAL HIGH (ref 70–99)
Glucose-Capillary: 186 mg/dL — ABNORMAL HIGH (ref 70–99)
Glucose-Capillary: 190 mg/dL — ABNORMAL HIGH (ref 70–99)
Glucose-Capillary: 237 mg/dL — ABNORMAL HIGH (ref 70–99)

## 2020-04-13 LAB — BRAIN NATRIURETIC PEPTIDE: B Natriuretic Peptide: 76.9 pg/mL (ref 0.0–100.0)

## 2020-04-13 MED ORDER — HYDRALAZINE HCL 25 MG PO TABS: 25.0000 mg | ORAL_TABLET | Freq: Two times a day (BID) | ORAL | 0 refills | Status: DC

## 2020-04-13 MED ORDER — FUROSEMIDE 40 MG PO TABS
40.0000 mg | ORAL_TABLET | ORAL | 0 refills | Status: DC
Start: 2020-04-14 — End: 2021-03-06

## 2020-04-13 NOTE — TOC Transition Note (Signed)
Transition of Care [facility]) - CM/SW Discharge Note   Patient Details  Name: [name] MRN: [id number] Date of Birth: 1932-02-22  Transition of Care [facility]) CM/SW Contact:  Carles Collet, RN Phone Number: 04/13/2020, 12:13 PM   Clinical Narrative:   Damaris Schooner w patient at bedside to set up home health services. He is agreeable to any agency. [facility], [facility] could not provide services. Alvis Lemmings accepted referral and will be in contact with patient to set up first home appointment. Patient denies needs for DME.  He states that his daughter will provide transport home.     Final next level of care: [place] Barriers to Discharge: No Barriers Identified   Patient Goals and CMS Choice Patient states their goals for this hospitalization and ongoing recovery are:: to go home CMS Medicare.gov Compare Post Acute Care list provided to:: Patient Choice offered to / list presented to : Patient  Discharge Placement                       Discharge Plan and Services                DME Arranged: N/A         HH Arranged: RN,Nurse's Aide HH Agency: [place] Date [facility] Agency Contacted: 04/13/20 Time [place]: [id number] Representative spoke with at [place]: [address] ([place]) Interventions     Readmission Risk Interventions No flowsheet data found.

## 2020-04-13 NOTE — Progress Notes (Signed)
Physical Therapy Treatment Patient Details Name: [name] MRN: [id number] DOB: [birth year] Today's Date: 04/13/2020    History of Present Illness [name] is a 87 y.o. male with a hx of CAD s/p CABG (2001)  and stenting, interstitial lung disease and rheumatoid arthritis presently not on any immunosuppressants, diabetes mellitus type 2, hypertension, mild aortic stenosis and chronic pain who recently received epidural steroid injection about 2 days prior to his presentation who presented with complaints of chest pressure over the last 24 hours which has been gradually even at rest    PT Comments    Pt fully participated in session. Pt states he feels better today and more balanced during ambulation. Pt performed ambulation wtihout AD without LOB. Pt able to perform supine therex without assist, education to perform at home. Pt educated on using RW outside at this time and to have S at home when performing the stairs. Pt verbalized understanding. Pt will benefit from skilled PT to address balance and gait to maximize independence while in the acute setting. No follow up PT recommended     Follow Up Recommendations  No PT follow up     Equipment Recommendations  None recommended by PT    Recommendations for Other Services       Precautions / Restrictions Precautions Precautions: Fall Restrictions Weight Bearing Restrictions: No    Mobility  Bed Mobility Overal bed mobility: Independent             General bed mobility comments: HOB elevated today  Transfers Overall transfer level: Independent Equipment used: None                Ambulation/Gait Ambulation/Gait assistance: Supervision;Min guard Gait Distance (Feet): 60 Feet Assistive device: 1 person hand held assist       General Gait Details: ambulated initially with 1 HHA without pushing on therapy, progressed to S from therapist, no LOB noted   Stairs             Wheelchair Mobility     Modified Rankin (Stroke Patients Only)       Balance Overall balance assessment: Mild deficits observed, not formally tested                                          Cognition Arousal/Alertness: Awake/alert Behavior During Therapy: WFL for tasks assessed/performed Overall Cognitive Status: Within Functional Limits for tasks assessed                                        Exercises General Exercises - Lower Extremity Ankle Circles/Pumps: AROM;Strengthening;Both;10 reps;Supine Gluteal Sets: AROM;Strengthening;Both;10 reps;Supine Heel Slides: AROM;Strengthening;Both;10 reps;Supine Hip ABduction/ADduction: AROM;Strengthening;Both;10 reps;Supine Straight Leg Raises: AROM;Strengthening;Both;10 reps;Supine    General Comments  VSS      Pertinent Vitals/Pain      Home Living                      Prior Function            PT Goals (current goals can now be found in the care plan section) Acute Rehab PT Goals Patient Stated Goal: I want to go home this afternoon PT Goal Formulation: With patient Time For Goal Achievement: 04/26/20 Potential to Achieve Goals: Good Progress towards PT goals: Progressing  toward goals    Frequency    Min 3X/week      PT Plan Current plan remains appropriate    Co-evaluation              AM-PAC PT "6 Clicks" Mobility   Outcome Measure  Help needed turning from your back to your side while in a flat bed without using bedrails?: None Help needed moving from lying on your back to sitting on the side of a flat bed without using bedrails?: None Help needed moving to and from a bed to a chair (including a wheelchair)?: None Help needed standing up from a chair using your arms (e.g., wheelchair or bedside chair)?: None Help needed to walk in hospital room?: A Little Help needed climbing 3-5 steps with a railing? : A Little 6 Click Score: 22    End of Session Equipment Utilized During  Treatment: Gait belt Activity Tolerance: Patient tolerated treatment well Patient left: in chair;with call bell/phone within reach Nurse Communication: Mobility status PT Visit Diagnosis: Unsteadiness on feet (R26.81)     Time: 4401-0272 PT Time Calculation (min) (ACUTE ONLY): 23 min  Charges:  $Gait Training: 8-22 mins $Therapeutic Exercise: 8-22 mins                     Lyanne Co, DPT Acute Rehabilitation Services [phone]   [place] 04/13/2020, 9:04 AM

## 2020-04-13 NOTE — Discharge Instructions (Signed)

## 2020-04-13 NOTE — Discharge Summary (Signed)
Discharge Summary  [name] UVO:[id number] DOB: 13-Nov-1931  PCP: Karie Schwalbe, MD  Admit date: 04/10/2020 Discharge date: 04/13/2020  Time spent: 35 minutes  Recommendations for Outpatient Follow-up:  1. Follow-up with your cardiologist within a week 2. Follow-up with your rheumatologist in 1 to 2 weeks 3. Follow-up with your PCP within a week. 4. Take your medications as prescribed  Discharge Diagnoses:  Active Hospital Problems   Diagnosis Date Noted  . Chest pain 04/10/2020  . CKD (chronic kidney disease) stage 4, GFR 15-29 ml/min ([name]) 05/17/2017  . DM (diabetes mellitus) type II controlled, neurological manifestation ([name]) 05/17/2017  . ILD (interstitial lung disease) ([name]) 11/28/2011  . Chronic diastolic heart failure ([name]) 09/14/2011  . Obstructive sleep apnea 12/17/2008  . 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 Essential hypertension 09/23/2006    Resolved Hospital Problems  No resolved problems to display.    Discharge Condition: Stable  Diet recommendation: Resume previous diet.  Vitals:   04/12/20 2313 04/13/20 0300  BP: 140/77 (!) 163/77  Pulse: 62 (!) 58  Resp: 17 18  Temp: 97.7 F (36.5 C) 97.7 F (36.5 C)  SpO2: 97% 97%    History of present illness:  [name] a 87 y.o.malewithhistory of CAD status post CABG and stenting, interstitial lung disease and rheumatoid arthritis presently not on any immunosuppressants, diabetes mellitus type 2, hypertension, mild aortic stenosis and chronic pain who recently received epidural steroid injection about 2 days prior to his presentation. He presents with complaints of chest pressure over the last 24 hours which has been gradually worsening even at rest.     ED Course:Improved with sublingual nitroglycerin. EKG shows normal sinus rhythm high sensitive troponin was mildly elevated and is showed a small increasing trend. Seen by cardiology.   Thought his presenting  symptoms were likely related to his recent spinal steroid injection. Of note his serum glucose was 588 and his BP was markedly elevated.   His chest pain resolved.  Cardiology made some adjustment to his medications for markedly elevated BP.  04/13/20: Patient was seen and examined at his bedside. There were no acute events overnight. His BP is much improved on current cardiac regimen. He denies any chest pain dyspnea or palpitations at the time of this visit. He has no new complaints. He is eager to go home. He was seen by cardiology and okay to discharge from a cardiac standpoint.   Hospital Course:  Principal Problem:   Chest pain Active Problems:   Familial multiple lipoprotein-type hyperlipidemia   Obstructive sleep apnea   Essential hypertension   Chronic diastolic heart failure ([name])   ILD (interstitial lung disease) ([name])   CKD (chronic kidney disease) stage 4, GFR 15-29 ml/min ([name])   DM (diabetes mellitus) type II controlled, neurological manifestation ([name])  Resolved chest pain in the setting of known coronary artery disease Patient presented with complaints of chest pain after receiving spinal steroid injection 2 days prior. He has a history coronary artery disease status post CABG, severe native three-vessel coronary artery disease including LAD, mild aortic stenosis. Suspect triggered by recent spinal steroid injection causing his blood pressure and blood sugar to spike His troponin as well slightly elevated peaked at 72 then trended down No evidence of acute ischemia on twelve-lead EKG. His chest pain has resolved. Appreciate cardiology's assistance  Improved uncontrolled hypertension in the setting of recent spinal steroid injections BP is much improved on current regimen. He is currently on Norvasc 5 mg  daily, Coreg 6.25 mg twice daily, p.o. Lasix 40 mg Monday Wednesday Friday, p.o. hydralazine 25 mg twice daily. Follow-up with cardiology within a week.  Nonsustained  V. Tach The morning of 04/12/2020 he had 22-runs of V. tach, nonsustained Coreg dose increased by cardiology 6.25 mg twice daily. Serum potassium 4.2, serum magnesium 2.1 Follow-up with cardiology within a week.  Coronary artery disease status post CABG Continue aspirin and Plavix Denies any anginal symptoms at the time of this visit.  Chronic diastolic CHF Last 2D echo done on 04/11/2020 shows normal LVEF 55 to 60% with grade 1 diastolic dysfunction BNP 76. Continue p.o. Lasix 40 mg Monday Wednesday Friday  Type 2 diabetes with hyperglycemia in the setting of recent steroid injection. Last hemoglobin A1c 8.9 on 04/11/2020 Resume home regimen. Follow-up with your PCP within a week  Resolving AKI on CKD 3B Baseline creatinine appears to be 1.9 with GFR of 32 Presented with creatinine of 2.3 with GFR of 27 Creatinine 2.00 on 04/13/2020. Losartan on hold due to recent AKI. Follow-up with your PCP within a week Continue to avoid nephrotoxins  GERD Continue home PPI  Diabetic polyneuropathy Continue home Lyrica  BPH Continue home Flomax     Code Status: Full code    Consultants:  Cardiology  Procedures:  2D echo  Antimicrobials:  None   Discharge Exam: BP (!) 163/77 (BP Location: Right Arm)   Pulse (!) 58   Temp 97.7 F (36.5 C) (Oral)   Resp 18   Ht 6' (1.829 m)   Wt 88.3 kg   SpO2 97%   BMI 26.40 kg/m  . General: 87 y.o. year-old male well developed well nourished in no acute distress.  Alert and oriented x3. . Cardiovascular: Regular rate and rhythm with no rubs or gallops.  No thyromegaly or JVD noted.   [name] Respiratory: Clear to auscultation with no wheezes or rales. Good inspiratory effort. . Abdomen: Soft nontender nondistended with normal bowel sounds x4 quadrants. . Musculoskeletal: No lower extremity edema bilaterally.  [name] Psychiatry: Mood is appropriate for condition and setting  Discharge Instructions You were cared for by a  hospitalist during your hospital stay. If you have any questions about your discharge medications or the care you received while you were in the hospital after you are discharged, you can call the unit and asked to speak with the hospitalist on call if the hospitalist that took care of you is not available. Once you are discharged, your primary care physician will handle any further medical issues. Please note that NO REFILLS for any discharge medications will be authorized once you are discharged, as it is imperative that you return to your primary care physician (or establish a relationship with a primary care physician if you do not have one) for your aftercare needs so that they can reassess your need for medications and monitor your lab values.   Allergies as of 04/13/2020      Reactions   Doxazosin Mesylate Other (See Comments)   dizziness   Methocarbamol Rash      Medication List    STOP taking these medications   losartan 25 MG tablet Commonly known as: COZAAR     TAKE these medications   amLODipine 5 MG tablet Commonly known as: NORVASC Take 5 mg by mouth daily.   aspirin EC 81 MG tablet Take 81 mg by mouth at bedtime.   carvedilol 6.25 MG tablet Commonly known as: COREG Take 6.25 mg by mouth 2 (two)  times daily.   clopidogrel 75 MG tablet Commonly known as: PLAVIX Take 1 tablet (75 mg total) by mouth daily.   EYE HEALTH FORMULA PO Take 1 tablet by mouth daily.   furosemide 40 MG tablet Commonly known as: LASIX Take 1 tablet (40 mg total) by mouth every Monday, Wednesday, and Friday. Start taking on: April 14, 2020 What changed:   when to take this  additional instructions   glucose blood test strip Commonly known as: ONE TOUCH ULTRA TEST USE TO CHECK BLOOD SUGAR ONCE A DAY Dx Code E11.40   hydrALAZINE 25 MG tablet Commonly known as: APRESOLINE Take 1 tablet (25 mg total) by mouth 2 (two) times daily.   HYDROcodone-acetaminophen 10-325 MG  tablet Commonly known as: Norco Take 2 tablets by mouth 2 (two) times daily as needed for severe pain. Must last 30 days. Max 4 tablets/day What changed: when to take this   nitroGLYCERIN 0.4 MG SL tablet Commonly known as: NITROSTAT DISSOLVE 1 TABLET UNDER TONGUE AS NEEDEDFOR CHEST PAIN. MAY REPEAT 5 MINUTES APART 3 TIMES IF NEEDED. IF NO RELIEF CALL 911 What changed: See the new instructions.   ondansetron 4 MG tablet Commonly known as: ZOFRAN TAKE 1 TABLET BY MOUTH EVERY 8 HOURS AS NEEDED FOR NAUSEA OR VOMITING What changed:   how much to take  how to take this  when to take this  reasons to take this  additional instructions   ONE TOUCH ULTRA 2 w/Device Kit Use to obtain blood sugar daily. Dx Code Z61.09   OneTouch Delica Lancets 60A Misc 1 each by In Vitro route daily. Dx Code E11.49   OXYGEN Inhale into the lungs. Per pt- Uses Oxygen 2 liters at night.   pantoprazole 40 MG tablet Commonly known as: PROTONIX Take 1 tablet (40 mg total) by mouth daily.   polyethylene glycol 17 g packet Commonly known as: MIRALAX / GLYCOLAX Take 51 g by mouth every other day.   pregabalin 25 MG capsule Commonly known as: Lyrica Take 1 capsule (25 mg total) by mouth at bedtime for 20 days, THEN 2 capsules (50 mg total) at bedtime. Start taking on: February 12, 2020 What changed: See the new instructions.   Semaglutide 3 MG Tabs Take 3 mg by mouth daily before breakfast.   tamsulosin 0.4 MG Caps capsule Commonly known as: FLOMAX Take 0.4 mg by mouth at bedtime.      Allergies  Allergen Reactions  . Doxazosin Mesylate Other (See Comments)    dizziness  . Methocarbamol Rash    Follow-up Information    [name], MD Follow up.   Specialty: Cardiology Why: the office should call you Monday to arrange follow up appt, if you have not heard by Tuesday please call out office.  Contact information: [address]  [address] [phone]        [name], MD. Call in 1 day(s).   Specialties: Internal Medicine, Pediatrics Why: Please call for a post hospital follow-up appointment. Contact information: [place] [address] [phone]        [name], MD. Call in 1 day(s).   Specialty: Rheumatology Why: Please call for a post hospital follow-up appointment. Contact information: [address] [place] [address] [phone]                The results of significant diagnostics from this hospitalization (including imaging, microbiology, ancillary and laboratory) are listed  below for reference.    Significant Diagnostic Studies: DG Chest 2 View  Result Date: 04/10/2020 CLINICAL DATA:  Acute chest pain and shortness of breath for 1 day. EXAM: CHEST - 2 VIEW COMPARISON:  04/03/2019 chest radiograph and prior studies FINDINGS: The cardiomediastinal silhouette is unremarkable. CABG changes are again noted. There is no evidence of focal airspace disease, pulmonary edema, suspicious pulmonary nodule/mass, pleural effusion, or pneumothorax. No acute bony abnormalities are identified. IMPRESSION: No active cardiopulmonary disease. Electronically Signed   By: Margarette Canada M.D.   On: 04/10/2020 14:45   DG PAIN CLINIC C-ARM 1-60 MIN NO REPORT  Result Date: 04/09/2020 Fluoro was used, but no Radiologist interpretation will be provided. Please refer to "NOTES" tab for provider progress note.  ECHOCARDIOGRAM COMPLETE  Result Date: 04/11/2020    ECHOCARDIOGRAM REPORT   Patient Name:   [name] Date of Exam: 04/11/2020 Medical Rec #:  [id number]       Height:       72.0 in Accession #:    [id number]      Weight:       201.0 lb Date of Birth:  Jun 30, 1931       BSA:          2.135 m Patient Age:    44 years        BP:           155/73 mmHg Patient Gender: M               HR:           54 bpm. Exam Location:  Inpatient Procedure: 2D Echo Indications:     chest pain  History:        Patient has prior history of Echocardiogram examinations, most                 recent 01/26/2018. Chronic kidney disease. Interstitial lung                 disease., Aortic Valve Disease; Risk Factors:Sleep Apnea,                 Diabetes and Hypertension.  Sonographer:    Johny Chess Referring Phys: [facility]  1. Left ventricular ejection fraction, by estimation, is 55 to 60%. The left ventricle has normal function. The left ventricle has no regional wall motion abnormalities. There is mild left ventricular hypertrophy. Left ventricular diastolic parameters are consistent with Grade I diastolic dysfunction (impaired relaxation).  2. Right ventricular systolic function is mildly reduced. The right ventricular size is normal. There is normal pulmonary artery systolic pressure. The estimated right ventricular systolic pressure is 00.8 mmHg.  3. Left atrial size was mildly dilated.  4. The mitral valve is normal in structure. Trivial mitral valve regurgitation. No evidence of mitral stenosis.  5. The aortic valve is tricuspid. Aortic valve regurgitation is trivial. Mild aortic valve stenosis. Aortic valve mean gradient measures 14.0 mmHg.  6. The inferior vena cava is normal in size with greater than 50% respiratory variability, suggesting right atrial pressure of 3 mmHg. FINDINGS  Left Ventricle: Left ventricular ejection fraction, by estimation, is 55 to 60%. The left ventricle has normal function. The left ventricle has no regional wall motion abnormalities. The left ventricular internal cavity size was normal in size. There is  mild left ventricular hypertrophy. Left ventricular diastolic parameters are consistent with Grade I diastolic dysfunction (impaired relaxation). Right Ventricle: The right ventricular size is normal.  No increase in right ventricular wall thickness. Right ventricular systolic function is mildly reduced. There is normal pulmonary  artery systolic pressure. The tricuspid regurgitant velocity is 2.47 m/s, and with an assumed right atrial pressure of 3 mmHg, the estimated right ventricular systolic pressure is 71.1 mmHg. Left Atrium: Left atrial size was mildly dilated. Right Atrium: Right atrial size was normal in size. Pericardium: There is no evidence of pericardial effusion. Mitral Valve: The mitral valve is normal in structure. There is mild calcification of the mitral valve leaflet(s). Mild mitral annular calcification. Trivial mitral valve regurgitation. No evidence of mitral valve stenosis. Tricuspid Valve: The tricuspid valve is normal in structure. Tricuspid valve regurgitation is trivial. Aortic Valve: The aortic valve is tricuspid. Aortic valve regurgitation is trivial. Mild aortic stenosis is present. Aortic valve mean gradient measures 14.0 mmHg. Aortic valve peak gradient measures 22.2 mmHg. Aortic valve area, by VTI measures 1.22 cm. Pulmonic Valve: The pulmonic valve was normal in structure. Pulmonic valve regurgitation is trivial. Aorta: The aortic root is normal in size and structure. Venous: The inferior vena cava is normal in size with greater than 50% respiratory variability, suggesting right atrial pressure of 3 mmHg. IAS/Shunts: No atrial level shunt detected by color flow Doppler.  LEFT VENTRICLE PLAX 2D LVIDd:         4.90 cm  Diastology LVIDs:         3.40 cm  LV e' medial:    5.66 cm/s LV PW:         1.20 cm  LV E/e' medial:  12.2 LV IVS:        1.00 cm  LV e' lateral:   9.36 cm/s LVOT diam:     2.20 cm  LV E/e' lateral: 7.4 LV SV:         72 LV SV Index:   34 LVOT Area:     3.80 cm  RIGHT VENTRICLE             IVC RV S prime:     10.00 cm/s  IVC diam: 1.50 cm TAPSE (M-mode): 1.2 cm LEFT ATRIUM             Index       RIGHT ATRIUM           Index LA diam:        4.10 cm 1.92 cm/m  RA Area:     12.70 cm LA Vol (A2C):   65.0 ml 30.44 ml/m RA Volume:   29.60 ml  13.86 ml/m LA Vol (A4C):   43.7 ml 20.47 ml/m LA  Biplane Vol: 54.5 ml 25.53 ml/m  AORTIC VALVE AV Area (Vmax):    1.20 cm AV Area (Vmean):   1.15 cm AV Area (VTI):     1.22 cm AV Vmax:           235.50 cm/s AV Vmean:          169.500 cm/s AV VTI:            0.587 m AV Peak Grad:      22.2 mmHg AV Mean Grad:      14.0 mmHg LVOT Vmax:         74.45 cm/s LVOT Vmean:        51.200 cm/s LVOT VTI:          0.189 m LVOT/AV VTI ratio: 0.32  AORTA Ao Root diam: 3.20 cm MITRAL VALVE  TRICUSPID VALVE MV Area (PHT): 2.00 cm    TR Peak grad:   24.4 mmHg MV Decel Time: 380 msec    TR Vmax:        247.00 cm/s MV E velocity: 69.00 cm/s MV A velocity: 94.30 cm/s  SHUNTS MV E/A ratio:  0.73        Systemic VTI:  0.19 m                            Systemic Diam: 2.20 cm Loralie Champagne MD Electronically signed by Loralie Champagne MD Signature Date/Time: 04/11/2020/5:58:20 PM    Final     Microbiology: Recent Results (from the past 240 hour(s))  SARS CORONAVIRUS 2 (TAT 6-24 HRS) Nasopharyngeal Nasopharyngeal Swab     Status: None   Collection Time: 04/11/20  2:40 AM   Specimen: Nasopharyngeal Swab  Result Value Ref Range Status   SARS Coronavirus 2 NEGATIVE NEGATIVE Final    Comment: (NOTE) SARS-CoV-2 target nucleic acids are NOT DETECTED.  The SARS-CoV-2 RNA is generally detectable in upper and lower respiratory specimens during the acute phase of infection. Negative results do not preclude SARS-CoV-2 infection, do not rule out co-infections with other pathogens, and should not be used as the sole basis for treatment or other patient management decisions. Negative results must be combined with clinical observations, patient history, and epidemiological information. The expected result is Negative.  Fact Sheet for Patients: SugarRoll.be  Fact Sheet for Healthcare Providers: https://www.woods-mathews.com/  This test is not yet approved or cleared by the [place] FDA and  has been authorized for  detection and/or diagnosis of SARS-CoV-2 by FDA under an Emergency Use Authorization (EUA). This EUA will remain  in effect (meaning this test can be used) for the duration of the COVID-19 declaration under Se ction 564(b)(1) of the Act, 21 U.S.C. section 360bbb-3(b)(1), unless the authorization is terminated or revoked sooner.  Performed at [facility], [place] [address], [place], [place] [address]      Labs: Basic Metabolic Panel: Recent Labs  Lab 04/10/20 1437 04/11/20 0317 04/12/20 0623 04/13/20 0614  NA 135 139 141 138  K 4.5 4.2 3.6 4.2  CL 99 102 101 102  CO2 21* $Remov'24 27 27  'pZccLU$ GLUCOSE 588* 264* 162* 211*  BUN 45* 42* 44* 39*  CREATININE 2.33* 1.98* 2.06* 2.00*  CALCIUM 9.8 10.1 9.9 9.4  MG  --   --  2.1  --    Liver Function Tests: No results for input(s): AST, ALT, ALKPHOS, BILITOT, PROT, ALBUMIN in the last 168 hours. No results for input(s): LIPASE, AMYLASE in the last 168 hours. No results for input(s): AMMONIA in the last 168 hours. CBC: Recent Labs  Lab 04/10/20 1437 04/11/20 0317 04/12/20 1002  WBC 18.9* 20.3* 10.4  HGB 13.3 14.1 14.1  HCT 40.6 43.2 41.8  MCV 87.1 87.1 85.5  PLT 191 195 182   Cardiac Enzymes: No results for input(s): CKTOTAL, CKMB, CKMBINDEX, TROPONINI in the last 168 hours. BNP: BNP (last 3 results) Recent Labs    04/13/20 0614  BNP 76.9    ProBNP (last 3 results) No results for input(s): PROBNP in the last 8760 hours.  CBG: Recent Labs  Lab 04/12/20 1750 04/12/20 1949 04/13/20 0007 04/13/20 0357 04/13/20 0731  GLUCAP 186* 186* 186* 179* 190*       Signed:  Kayleen Memos, MD [facility] 04/13/2020, 11:05 AM

## 2020-04-13 NOTE — Progress Notes (Signed)
Progress Note  Patient Name: [name] Date of Encounter: 04/13/2020  [facility] Cardiologist: [name], MD   Subjective    Feels good  I want to go home  NO CP  NO fatigue No dizzienss Inpatient Medications    Scheduled Meds: . amLODipine  5 mg Oral Daily  . aspirin EC  81 mg Oral QHS  . carvedilol  6.25 mg Oral BID WC  . clopidogrel  75 mg Oral Daily  . enoxaparin (LOVENOX) injection  30 mg Subcutaneous Q24H  . [START ON 04/14/2020] furosemide  40 mg Oral Q M,W,F  . hydrALAZINE  25 mg Oral BID  . insulin aspart  0-9 Units Subcutaneous Q4H  . pantoprazole  40 mg Oral Daily  . pregabalin  25 mg Oral QHS  . tamsulosin  0.4 mg Oral QHS   Continuous Infusions:  PRN Meds: hydrALAZINE, HYDROcodone-acetaminophen, nitroGLYCERIN   Vital Signs    Vitals:   04/12/20 1944 04/12/20 2112 04/12/20 2313 04/13/20 0300  BP: (!) 153/74 (!) 155/79 140/77 (!) 163/77  Pulse: 63  62 (!) 58  Resp: 18  17 18   Temp: 97.8 F (36.6 C)  97.7 F (36.5 C) 97.7 F (36.5 C)  TempSrc: Oral  Oral Oral  SpO2: 96%  97% 97%  Weight:    88.3 kg  Height:        Intake/Output Summary (Last 24 hours) at 04/13/2020 0713 Last data filed at 04/13/2020 0600 Gross per 24 hour  Intake 222 ml  Output --  Net 222 ml   Last 3 Weights 04/13/2020 04/12/2020 04/09/2020  Weight (lbs) 194 lb 10.7 oz 196 lb 6.9 oz 201 lb  Weight (kg) 88.3 kg 89.1 kg 91.173 kg      Telemetry    -SB/SR   Personally Reviewed  ECG    NO new  - Personally Reviewed  Physical Exam   GEN: No acute distress.   Neck: No JVD Cardiac: RRR, no murmurs, rubs, or gallops.  Respiratory: Clear to auscultation bilaterally. GI: Soft, nontender, non-distended  MS: No edema; No deformity. Neuro:  Nonfocal  Psych: Normal affect   Labs    High Sensitivity Troponin:   Recent Labs  Lab 04/10/20 2122 04/11/20 0317 04/11/20 1250 04/11/20 1450 04/11/20 1807  TROPONINIHS 49* 68* 72* 47* 71*      Chemistry Recent Labs   Lab 04/11/20 0317 04/12/20 0623 04/13/20 0614  NA 139 141 138  K 4.2 3.6 4.2  CL 102 101 102  CO2 24 27 27   GLUCOSE 264* 162* 211*  BUN 42* 44* 39*  CREATININE 1.98* 2.06* 2.00*  CALCIUM 10.1 9.9 9.4  GFRNONAA 32* 31* 32*  ANIONGAP 13 13 9      Hematology Recent Labs  Lab 04/10/20 1437 04/11/20 0317 04/12/20 1002  WBC 18.9* 20.3* 10.4  RBC 4.66 4.96 4.89  HGB 13.3 14.1 14.1  HCT 40.6 43.2 41.8  MCV 87.1 87.1 85.5  MCH 28.5 28.4 28.8  MCHC 32.8 32.6 33.7  RDW 13.2 13.3 13.4  PLT 191 195 182    BNPNo results for input(s): BNP, PROBNP in the last 168 hours.   DDimer No results for input(s): DDIMER in the last 168 hours.   Radiology    ECHOCARDIOGRAM COMPLETE  Result Date: 04/11/2020    ECHOCARDIOGRAM REPORT   Patient Name:   [name] Date of Exam: 04/11/2020 Medical Rec #:  [id number]       Height:  72.0 in Accession #:    [id number]      Weight:       201.0 lb Date of Birth:  Jun 12, 1931       BSA:          2.135 m Patient Age:    87 years        BP:           155/73 mmHg Patient Gender: M               HR:           54 bpm. Exam Location:  Inpatient Procedure: 2D Echo Indications:    chest pain  History:        Patient has prior history of Echocardiogram examinations, most                 recent 01/26/2018. Chronic kidney disease. Interstitial lung                 disease., Aortic Valve Disease; Risk Factors:Sleep Apnea,                 Diabetes and Hypertension.  Sonographer:    Johny Chess Referring Phys: [place]  1. Left ventricular ejection fraction, by estimation, is 55 to 60%. The left ventricle has normal function. The left ventricle has no regional wall motion abnormalities. There is mild left ventricular hypertrophy. Left ventricular diastolic parameters are consistent with Grade I diastolic dysfunction (impaired relaxation).  2. Right ventricular systolic function is mildly reduced. The right ventricular size is normal. There  is normal pulmonary artery systolic pressure. The estimated right ventricular systolic pressure is 18.8 mmHg.  3. Left atrial size was mildly dilated.  4. The mitral valve is normal in structure. Trivial mitral valve regurgitation. No evidence of mitral stenosis.  5. The aortic valve is tricuspid. Aortic valve regurgitation is trivial. Mild aortic valve stenosis. Aortic valve mean gradient measures 14.0 mmHg.  6. The inferior vena cava is normal in size with greater than 50% respiratory variability, suggesting right atrial pressure of 3 mmHg. FINDINGS  Left Ventricle: Left ventricular ejection fraction, by estimation, is 55 to 60%. The left ventricle has normal function. The left ventricle has no regional wall motion abnormalities. The left ventricular internal cavity size was normal in size. There is  mild left ventricular hypertrophy. Left ventricular diastolic parameters are consistent with Grade I diastolic dysfunction (impaired relaxation). Right Ventricle: The right ventricular size is normal. No increase in right ventricular wall thickness. Right ventricular systolic function is mildly reduced. There is normal pulmonary artery systolic pressure. The tricuspid regurgitant velocity is 2.47 m/s, and with an assumed right atrial pressure of 3 mmHg, the estimated right ventricular systolic pressure is 41.6 mmHg. Left Atrium: Left atrial size was mildly dilated. Right Atrium: Right atrial size was normal in size. Pericardium: There is no evidence of pericardial effusion. Mitral Valve: The mitral valve is normal in structure. There is mild calcification of the mitral valve leaflet(s). Mild mitral annular calcification. Trivial mitral valve regurgitation. No evidence of mitral valve stenosis. Tricuspid Valve: The tricuspid valve is normal in structure. Tricuspid valve regurgitation is trivial. Aortic Valve: The aortic valve is tricuspid. Aortic valve regurgitation is trivial. Mild aortic stenosis is present. Aortic  valve mean gradient measures 14.0 mmHg. Aortic valve peak gradient measures 22.2 mmHg. Aortic valve area, by VTI measures 1.22 cm. Pulmonic Valve: The pulmonic valve was normal in structure. Pulmonic valve regurgitation is trivial.  Aorta: The aortic root is normal in size and structure. Venous: The inferior vena cava is normal in size with greater than 50% respiratory variability, suggesting right atrial pressure of 3 mmHg. IAS/Shunts: No atrial level shunt detected by color flow Doppler.  LEFT VENTRICLE PLAX 2D LVIDd:         4.90 cm  Diastology LVIDs:         3.40 cm  LV e' medial:    5.66 cm/s LV PW:         1.20 cm  LV E/e' medial:  12.2 LV IVS:        1.00 cm  LV e' lateral:   9.36 cm/s LVOT diam:     2.20 cm  LV E/e' lateral: 7.4 LV SV:         72 LV SV Index:   34 LVOT Area:     3.80 cm  RIGHT VENTRICLE             IVC RV S prime:     10.00 cm/s  IVC diam: 1.50 cm TAPSE (M-mode): 1.2 cm LEFT ATRIUM             Index       RIGHT ATRIUM           Index LA diam:        4.10 cm 1.92 cm/m  RA Area:     12.70 cm LA Vol (A2C):   65.0 ml 30.44 ml/m RA Volume:   29.60 ml  13.86 ml/m LA Vol (A4C):   43.7 ml 20.47 ml/m LA Biplane Vol: 54.5 ml 25.53 ml/m  AORTIC VALVE AV Area (Vmax):    1.20 cm AV Area (Vmean):   1.15 cm AV Area (VTI):     1.22 cm AV Vmax:           235.50 cm/s AV Vmean:          169.500 cm/s AV VTI:            0.587 m AV Peak Grad:      22.2 mmHg AV Mean Grad:      14.0 mmHg LVOT Vmax:         74.45 cm/s LVOT Vmean:        51.200 cm/s LVOT VTI:          0.189 m LVOT/AV VTI ratio: 0.32  AORTA Ao Root diam: 3.20 cm MITRAL VALVE               TRICUSPID VALVE MV Area (PHT): 2.00 cm    TR Peak grad:   24.4 mmHg MV Decel Time: 380 msec    TR Vmax:        247.00 cm/s MV E velocity: 69.00 cm/s MV A velocity: 94.30 cm/s  SHUNTS MV E/A ratio:  0.73        Systemic VTI:  0.19 m                            Systemic Diam: 2.20 cm Loralie Champagne MD Electronically signed by Loralie Champagne MD Signature  Date/Time: 04/11/2020/5:58:20 PM    Final     Cardiac Studies   Echo   04/11/20  Left ventricular ejection fraction, by estimation, is 55 to 60%. The left ventricle has normal function. The left ventricle has no regional wall motion abnormalities. There is mild left ventricular hypertrophy. Left ventricular diastolic parameters are consistent with Grade I diastolic dysfunction (  impaired relaxation). 2. Right ventricular systolic function is mildly reduced. The right ventricular size is normal. There is normal pulmonary artery systolic pressure. The estimated right ventricular systolic pressure is 40.8 mmHg. 3. Left atrial size was mildly dilated. 4. The mitral valve is normal in structure. Trivial mitral valve regurgitation. No evidence of mitral stenosis. 5. The aortic valve is tricuspid. Aortic valve regurgitation is trivial. Mild aortic valve stenosis. Aortic valve mean gradient measures 14.0 mmHg. 6. The inferior vena cava is normal in size with greater than 50% respiratory variability, suggesting right atrial pressure of 3 mmHg. EchoStudy Conclusions10/2019  - Left ventricle: The cavity size was normal. Wall thickness was  increased in a pattern of moderate LVH. Systolic function was  normal. The estimated ejection fraction was in the range of 50%  to 55%. There is akinesis of the inferior myocardium. Doppler  parameters are consistent with abnormal left ventricular  relaxation (grade 1 diastolic dysfunction).  - Aortic valve: Valve mobility was restricted. There was mild  stenosis. There was trivial regurgitation.  - Mitral valve: Calcified annulus.  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  - Pericardium, extracardiac: A trivial pericardial effusion was  identified.   Left Heart Cath and Cors/Grafts Angiography5/2017  Conclusion   Prox RCA lesion, 100% stenosed.  LIMA .  The LIMA to LAD graft is widely patent without  stenosis.  Sequential SVG .  The sequential saphenous vein graft to OM1 and OM 2 has critical stenosis within the previously stented segment in the proximal body of the graft.  SVG .  Graft known to be totally occluded, not selectively injected  Origin lesion, 100% stenosed.  SVG .  The graft is chronically occluded  Prox Graft lesion, 100% stenosed.  Prox LAD lesion, 100% stenosed.  Ost LM lesion, 80% stenosed.  Ost 2nd Mrg to 2nd Mrg lesion, 90% stenosed.  Prox Graft to Mid Graft lesion, before 1st Mrg, 99% stenosed. Post intervention, there is a 0% residual stenosis. The lesion was previously treated with a stent (unknown type).   1. Severe native three-vessel coronary artery disease with severe left main stenosis, total occlusion of the LAD, total occlusion of the RCA, and severe native left circumflex stenosis  2. Status post aortocoronary bypass surgery with continued patency of the LIMA to LAD and continued patency of the saphenous vein graft to OM with severe stenosis in the proximal body of the graft (in-stent)  3. Chronic total occlusion of the saphenous graft RCA and saphenous vein graft to diagonal  4. Successful PCI of the saphenous vein graft OM with placement of a drug-eluting stent(treatment of in-stent restenosis)  Recommendation: dual antiplatelet therapy with aspirin and Plavix at least 12 months. Consider long-term therapy if tolerated in this patient with previous bypass grafting who has required multiple PCI procedures.     Patient Profile   HARTLEY URTON is a 87 y.o. male with a hx of CAD s/p CABG (2001) with prior DES to RCA and cutting balloon to ostial PDA (2013); DES to SVG to OM2 (2015); DES to SVG to 1st Mg, 2nd Mg (2017) with known CTO of SVG to RCA and SVG to D1 on most recent [place] 2017. Also with a hx of ILD, RA not presently on immunosuppressants, DM2, HTN, HLD, mild AS per echo in 2019 and achalasia with recent EGD showing  stricturing 02/2020 who is being seen today for the evaluation of chest pain at the request of Dr. [place] Crane.  Assessment & Plan    1  Chest pain/ fatigue  Pt feeling better   Triv elevation of trop  No real trend  Known CAD   Feels good   Keep on current regimen   WIll make sure he has f/u    2  HTN  BP high yeseteday   Meds adjusted  ( ? Some effect of steroids) Tolerating    Will make sure he has close f/u    3  AS  Mild on last echo    4  Pulm  Hx interstitial lung dz   Follows in rheum  5  INcrease WBC   Came down to 10    OK to go home   IWll make sure he hs f/u close in clinci   For questions or updates, please contact [facility] Please consult [email or web address] for contact info under        Signed, Dorris Carnes, MD  04/13/2020, 7:13 AM

## 2020-04-14 ENCOUNTER — Telehealth: Payer: Self-pay

## 2020-04-14 NOTE — Progress Notes (Signed)
CARDIOLOGY OFFICE NOTE  Date:  04/14/2020    [name] Date of Birth: [birth year] Medical Record #[id number]  PCP:  [name] Carbon, MD  Cardiologist:  [name] chief complaint on file.   History of Present Illness:   87 y.o.  history of known CAD with prior CABG and prior DES to SVG to RCA and PCI/cutting balloon to PD in 2013. Had DES to SVG to OM2 in 2015 and another DES to SVG to OM1/OM2 in 2017. He has chronic fatigue and dyspnea with ILD, RA, DM, HTN, HLD, GERD and mild AS with last echo 04/11/20 showing mean gradient of 14 mmHg  He has botox injections for dysphagia per Dr. Henrene [name]. He uses oxygen at night  Seen in hospital 1/6-1/9 for chest pain poorly controlled BP and hyperglycemia with BS >500. He had received a spinal epidural injection of steroids 48 hours prior Had some bradycardia and NSVT coreg dose decreased R/O echo as above with normal EF no RWMA and AS remaining mild CXR with NAD   Med changes losartan stopped with Cr around 2  D/c on norvasc 5 mg, coreg 6.25 bid lasix 40 mg , hydralazine 25 bid He was to f/u with Dr [name] for his Multiple medical problems and DR [name] from rheumatology   Doing well since d/c has f/u with [name] for BS     Past Medical History:  Diagnosis Date  . Arthritis    osteoarthritis, s/p R TKR, and digits  . CAD (coronary artery disease)    a. s/p CABG (2001)  b. s/p DES to RCA and cutting POBA to ostial PDA (2013)   c. s/p DES to SVG to OM2 (01/14/14) d. cath: 08/2015 NSTEMI w/ patent LIMA-LAD and 99% stenosis of SVG-OM w/ DES placed. CTO of SVG-RCA and SVG-D1.   . Cataract   . Chronic diastolic CHF (congestive heart failure) ([place])    a) 09/13 ECHO- LVEF 18-29%, grade 1 diastolic dysfunction, mild LA dilatation, atrial septal aneurysm, AV mobility restricted, but no sig AS by doppler; b) 09/04/08 ECHO- LVH, ef 60%, mild AS, c. echo 08/2015: EF perserved of 55-60% with inferolateral HK. Mild AS noted.  . Chronic kidney  disease, stage III (moderate) ([place])   . Chronic lower back pain   . Colon polyps   . COVID-19   . Diverticulosis   . Dyspnea 2009 since July -Sept   05/06/08-CPST-  normal effort, reduced VO2 max 20.5 /65%, reduced at 8.2/ 40%, normal breathing resetvca of 55%, submaximal heart rate response 112/77%, flattened o2 pluse response at peak exercise-12 ml/beat @ 85%, No VQ mismatch abnormalities, All c/w CIRC Limitation  . Enlarged prostate   . Esophageal stricture    a. s/p dilation spring 2010  . GERD (gastroesophageal reflux disease)   . Heart murmur   . Hiatal hernia   . History of carpal tunnel syndrome    Bilateral  . History of kidney stones   . History of PFTs    mixed pattern on spiro. mild restn on lung volumes with near normal DLCO. Pattern can be explained by CABG scar. Fev1 2.2L/73%, ratio 68 (67), TLC 4.7/68%,RV 1.5L/55%,DLCO 79%  . Hyperlipidemia   . Hypertension   . Interstitial lung disease ([name])    NOS  . Iron deficiency anemia   . Nausea & vomiting    2018/2019  . On home oxygen therapy    2 L [place] at bedtime  . Osteoporosis   .  Overweight (BMI 25.0-29.9)    BMI 29  . Peripheral neuropathy   . RA (rheumatoid arthritis) ([name])    Dr [name]  . Seropositive rheumatoid arthritis ([place])   . Type II diabetes mellitus ([name])    diet controlled  . Wears glasses   . Wears partial dentures    upper    Past Surgical History:  Procedure Laterality Date  . BALLOON DILATION N/A 09/12/2018   Procedure: BALLOON DILATION;  Surgeon: [name] Shipper, MD;  Location: [facility];  Service: Endoscopy;  Laterality: N/A;  . BALLOON DILATION N/A 02/20/2020   Procedure: BALLOON DILATION;  Surgeon: [name] Shipper, MD;  Location: [facility];  Service: Endoscopy;  Laterality: N/A;  . BOTOX INJECTION N/A 09/12/2018   Procedure: BOTOX INJECTION;  Surgeon: [name] Shipper, MD;  Location: [facility];  Service: Endoscopy;  Laterality: N/A;  . BOTOX INJECTION N/A 11/27/2018   Procedure: BOTOX  INJECTION;  Surgeon: [name] Shipper, MD;  Location: [facility];  Service: Endoscopy;  Laterality: N/A;  . BOTOX INJECTION N/A 02/20/2020   Procedure: BOTOX INJECTION;  Surgeon: [name] Shipper, MD;  Location: [facility];  Service: Endoscopy;  Laterality: N/A;  . CARDIAC CATHETERIZATION  08/2004   CP- no MI, Cath- small vessell disease   . CARDIAC CATHETERIZATION  12/31/2011   80% distal LM, 100% native LAD, LCx and RCA, 30% prox SVG-OM, SVG-D1 normal, 99% distal, 80% ostial SVG-RCA distal to graft, LIMA-LAD normal; LVEF mildly decreased with posterior basal AK   . CARDIAC CATHETERIZATION  2009   with patent grafts/notes 12/31/2011  . CARDIAC CATHETERIZATION N/A 08/13/2015   Procedure: Left Heart Cath and Cors/Grafts Angiography;  Surgeon: Sherren Mocha, MD;  Location: [facility];  Service: Cardiovascular;  Laterality: N/A;  . CATARACT EXTRACTION W/ INTRAOCULAR LENS  IMPLANT, BILATERAL Bilateral   . CHOLECYSTECTOMY OPEN  11/2003   [name]  . CORONARY ANGIOPLASTY WITH STENT PLACEMENT  01/03/2012   Successful DES to SVG-RCA and cutting balloon angioplasty ostial  PDA   . CORONARY ANGIOPLASTY WITH STENT PLACEMENT  01/14/2014   "1"  . CORONARY ARTERY BYPASS GRAFT  11/1999   CABG X5  . CORONARY STENT PLACEMENT  02/2012   1 stent and balloon  . ESOPHAGEAL DILATION  11/27/2018   Procedure: ESOPHAGEAL DILATION;  Surgeon: [name] Shipper, MD;  Location: [facility];  Service: Endoscopy;;  . ESOPHAGOGASTRODUODENOSCOPY N/A 03/01/2017   Procedure: ESOPHAGOGASTRODUODENOSCOPY (EGD);  Surgeon: [name] Shipper, MD;  Location: [facility];  Service: Endoscopy;  Laterality: N/A;  . ESOPHAGOGASTRODUODENOSCOPY (EGD) WITH ESOPHAGEAL DILATION  2010  . ESOPHAGOGASTRODUODENOSCOPY (EGD) WITH PROPOFOL N/A 09/12/2018   Procedure: ESOPHAGOGASTRODUODENOSCOPY (EGD) WITH PROPOFOL;  Surgeon: [name] Shipper, MD;  Location: [facility];  Service: Endoscopy;  Laterality: N/A;  . ESOPHAGOGASTRODUODENOSCOPY (EGD) WITH PROPOFOL  N/A 11/27/2018   Procedure: ESOPHAGOGASTRODUODENOSCOPY (EGD) WITH PROPOFOL, WITH BALLOON DILATION;  Surgeon: [name] Shipper, MD;  Location: [facility];  Service: Endoscopy;  Laterality: N/A;  . ESOPHAGOGASTRODUODENOSCOPY (EGD) WITH PROPOFOL N/A 02/20/2020   Procedure: ESOPHAGOGASTRODUODENOSCOPY (EGD) WITH PROPOFOL;  Surgeon: [name] Shipper, MD;  Location: [facility];  Service: Endoscopy;  Laterality: N/A;  . HAND SURGERY     bilateral carpal tunnel releases  . JOINT REPLACEMENT    . KNEE ARTHROSCOPY Right 2008  . LEFT AND RIGHT HEART CATHETERIZATION WITH CORONARY ANGIOGRAM  12/31/2011   Procedure: LEFT AND RIGHT HEART CATHETERIZATION WITH CORONARY ANGIOGRAM;  Surgeon: Burnell Blanks, MD;  Location: [facility] C[facility] LAB;  Service:  Cardiovascular;;  . LEFT AND RIGHT HEART CATHETERIZATION WITH CORONARY ANGIOGRAM N/A 01/14/2014   Procedure: LEFT AND RIGHT HEART CATHETERIZATION WITH CORONARY ANGIOGRAM;  Surgeon: Meylin Stenzel M Martinique, MD;  Location: [facility] CATH LAB;  Service: Cardiovascular;  Laterality: N/A;  . MALONEY DILATION  03/01/2017   Procedure: [name] Minks DILATION;  Surgeon: [name] Shipper, MD;  Location: [facility];  Service: Endoscopy;;  . PERCUTANEOUS CORONARY INTERVENTION-BALLOON ONLY  01/03/2012   Procedure: PERCUTANEOUS CORONARY INTERVENTION-BALLOON ONLY;  Surgeon: Alfred Eckley M Martinique, MD;  Location: [facility] CATH LAB;  Service: Cardiovascular;;  . PERCUTANEOUS CORONARY STENT INTERVENTION (PCI-S)  12/31/2011   Procedure: PERCUTANEOUS CORONARY STENT INTERVENTION (PCI-S);  Surgeon: Burnell Blanks, MD;  Location: [facility] CATH LAB;  Service: Cardiovascular;;  . PERCUTANEOUS CORONARY STENT INTERVENTION (PCI-S) N/A 01/03/2012   Procedure: PERCUTANEOUS CORONARY STENT INTERVENTION (PCI-S);  Surgeon: Vaidehi Braddy M Martinique, MD;  Location: [facility] CATH LAB;  Service: Cardiovascular;  Laterality: N/A;  . SHOULDER ARTHROSCOPY WITH OPEN ROTATOR CUFF REPAIR AND DISTAL CLAVICLE ACROMINECTOMY Left 02/27/2013   Procedure: LEFT SHOULDER  ARTHROSCOPY WITH MINI OPEN ROTATOR CUFF REPAIR AND SUBACROMIAL DECOMPRESSION AND DISTAL CLAVICLE RESECTION;  Surgeon: [name], MD;  Location: [facility];  Service: Orthopedics;  Laterality: Left;  . TOTAL KNEE ARTHROPLASTY Right 03/2010   [name]  . TRIGGER FINGER RELEASE Left 02/27/2013   Procedure: RELEASE TRIGGER FINGER/A-1 PULLEY;  Surgeon: [name], MD;  Location: [facility];  Service: Orthopedics;  Laterality: Left;     Medications: No outpatient medications have been marked as taking for the 04/15/20 encounter (Appointment) with [name], MD.     Allergies: Allergies  Allergen Reactions  . Doxazosin Mesylate Other (See Comments)    dizziness  . Methocarbamol Rash    Social History: The patient  reports that he quit smoking about 57 years ago. His smoking use included cigarettes. He has a 20.00 pack-year smoking history. He has quit using smokeless tobacco. He reports that he does not drink alcohol and does not use drugs.   Family History: The patient's family history includes Alcohol abuse in his sister; COPD in his mother; Colon cancer (age of onset: 68) in his brother; Diabetes in his brother; Heart attack in his father; Heart disease in his father; Stomach cancer in his brother; Stroke in his sister.   Review of Systems: Please see the history of present illness.   All other systems are reviewed and negative.   Physical Exam: VS:  There were no vitals taken for this visit. [name]  BMI There is no height or weight on file to calculate BMI.  Wt Readings from Last 3 Encounters:  04/13/20 88.3 kg  04/09/20 91.2 kg  02/26/20 89.4 kg    Affect appropriate Frail elderly male  HEENT: normal Neck supple with no adenopathy JVP normal no bruits no thyromegaly Lungs clear with no wheezing and good diaphragmatic motion Heart:  S1/S2 preserved AS  murmur, no rub, gallop or click PMI normal Abdomen: benighn, BS positve, no tenderness, no AAA no bruit.  No HSM  or HJR Distal pulses intact with no bruits No edema Neuro non-focal Skin warm and dry No muscular weakness    LABORATORY DATA:  EKG:   04/11/20 SR rate 78 normal     Lab Results  Component Value Date   WBC 10.4 04/12/2020   HGB 14.1 04/12/2020   HCT 41.8 04/12/2020   PLT 182 04/12/2020   GLUCOSE 211 (H) 04/13/2020   CHOL 209 (H) 06/13/2019  TRIG 275.0 (H) 06/13/2019   HDL 35.50 (L) 06/13/2019   LDLDIRECT 125.0 06/13/2019   LDLCALC 43 10/14/2016   ALT 9 06/13/2019   AST 14 06/13/2019   NA 138 04/13/2020   K 4.2 04/13/2020   CL 102 04/13/2020   CREATININE 2.00 (H) 04/13/2020   BUN 39 (H) 04/13/2020   CO2 27 04/13/2020   TSH 2.100 04/08/2017   PSA 1.83 02/17/2009   INR 1.12 08/13/2015   HGBA1C 8.9 (H) 04/11/2020   MICROALBUR 7.5 (H) 04/28/2018     BNP (last 3 results) Recent Labs    04/13/20 0614  BNP 76.9    ProBNP (last 3 results) No results for input(s): PROBNP in the last 8760 hours.   Other Studies Reviewed Today:  Echo Study Conclusions 04/11/20 1. Left ventricular ejection fraction, by estimation, is 55 to 60%. The  left ventricle has normal function. The left ventricle has no regional  wall motion abnormalities. There is mild left ventricular hypertrophy.  Left ventricular diastolic parameters  are consistent with Grade I diastolic dysfunction (impaired relaxation).  2. Right ventricular systolic function is mildly reduced. The right  ventricular size is normal. There is normal pulmonary artery systolic  pressure. The estimated right ventricular systolic pressure is 16.6 mmHg.  3. Left atrial size was mildly dilated.  4. The mitral valve is normal in structure. Trivial mitral valve  regurgitation. No evidence of mitral stenosis.  5. The aortic valve is tricuspid. Aortic valve regurgitation is trivial.  Mild aortic valve stenosis. Aortic valve mean gradient measures 14.0 mmHg.  6. The inferior vena cava is normal in size with greater than 50%   respiratory variability, suggesting right atrial pressure of 3 mmHg.    Left Heart Cath and Cors/Grafts Angiography 08/2015  Conclusion   Prox RCA lesion, 100% stenosed.  LIMA .  The LIMA to LAD graft is widely patent without stenosis.  Sequential SVG .  The sequential saphenous vein graft to OM1 and OM 2 has critical stenosis within the previously stented segment in the proximal body of the graft.  SVG .  Graft known to be totally occluded, not selectively injected  Origin lesion, 100% stenosed.  SVG .  The graft is chronically occluded  Prox Graft lesion, 100% stenosed.  Prox LAD lesion, 100% stenosed.  Ost LM lesion, 80% stenosed.  Ost 2nd Mrg to 2nd Mrg lesion, 90% stenosed.  Prox Graft to Mid Graft lesion, before 1st Mrg, 99% stenosed. Post intervention, there is a 0% residual stenosis. The lesion was previously treated with a stent (unknown type).   1. Severe native three-vessel coronary artery disease with severe left main stenosis, total occlusion of the LAD, total occlusion of the RCA, and severe native left circumflex stenosis  2. Status post aortocoronary bypass surgery with continued patency of the LIMA to LAD and continued patency of the saphenous vein graft to OM with severe stenosis in the proximal body of the graft (in-stent)  3. Chronic total occlusion of the saphenous graft RCA and saphenous vein graft to diagonal  4. Successful PCI of the saphenous vein graft OM with placement of a drug-eluting stent(treatment of in-stent restenosis)  Recommendation: dual antiplatelet therapy with aspirin and Plavix at least 12 months. Consider long-term therapy if tolerated in this patient with previous bypass grafting who has required multiple PCI procedures.   ASSESSMENT & PLAN:    1. CAD with remote CABG and multiple interventions - last in May of 2017 - he is managed medically  Recent hospitalization appears to be non cardiac chest pain With normal ECG  no new RWMAls on echo and no trend in troponins with peak only 71  2. ILD - chronic DOE - this seems stable - has upcoming pulmonary visit planned.   3. Chronic dysphagia - getting botox injections per GI and dilatation - now with some "morning sickness nausea" - seeing GI soon - remains on PPI  4. HTN - BP is fine on his current regimen - no changes made today.   5. CKD - followed by Renal baseline around 2   6. Mild AS - no cardinal symptoms noted. Would favor conservative management. Mean gradient only 14 peak 22 mmHg unchanged TTE 04/11/20   Current medicines are reviewed with the patient today.  The patient does not have concerns regarding medicines other than what has been noted above.  The following changes have been made:  See above.  Labs/ tests ordered today include:   No orders of the defined types were placed in this encounter.    Disposition:   FU with me in 6 months   Patient is agreeable to this plan and will call if any problems develop in the interim.   Signed: [name], MD  04/14/2020 5:31 PM  [facility] [address] [place], [place]  [address] Phone: [phone] Fax: [phone]

## 2020-04-14 NOTE — Telephone Encounter (Signed)
Transition Care Management Follow-up Telephone Call  Date of discharge and from where: [facility]13/2020[name]  How have you been since you were released from the hospital? Patient states he is feeling better.  Any questions or concerns? No  Items Reviewed:  Did the pt receive and understand the discharge instructions provided? Yes   Medications obtained and verified? Yes   Other? No   Any new allergies since your discharge? No   Dietary orders reviewed? Yes  Do you have support at home? Yes   Home Care and Equipment/Supplies: Were home health services ordered? yes If so, what is the name of the agency? [facility]  Has the agency set up a time to come to the patient's home? no Were any new equipment or medical supplies ordered?  No What is the name of the medical supply agency? N/A Were you able to get the supplies/equipment? not applicable Do you have any questions related to the use of the equipment or supplies? No  Functional Questionnaire: (I = Independent and D = Dependent) ADLs: I  Bathing/Dressing- I  Meal Prep- I  Eating- I  Maintaining continence- I  Transferring/Ambulation- I  Managing Meds- I  Follow up appointments reviewed:   PCP Hospital f/u appt confirmed? Yes  Scheduled to see Dr. Silvio Pate on 04/23/2020 @ 11 am.  [facility] f/u appt confirmed? Yes  Scheduled to see cardiology.  Are transportation arrangements needed? No   If their condition worsens, is the pt aware to call PCP or go to the Emergency Dept.? Yes  Was the patient provided with contact information for the PCP's office or ED? Yes  Was to pt encouraged to call back with questions or concerns? Yes

## 2020-04-15 ENCOUNTER — Other Ambulatory Visit: Payer: Self-pay

## 2020-04-15 ENCOUNTER — Encounter: Payer: Self-pay | Admitting: Cardiovascular Disease

## 2020-04-15 ENCOUNTER — Ambulatory Visit: Payer: Medicare Other | Admitting: Cardiovascular Disease

## 2020-04-15 VITALS — BP 114/66 | HR 67 | Ht 72.0 in | Wt 196.0 lb

## 2020-04-15 DIAGNOSIS — R739 Hyperglycemia, unspecified: Secondary | ICD-10-CM

## 2020-04-15 DIAGNOSIS — Z951 Presence of aortocoronary bypass graft: Secondary | ICD-10-CM | POA: Diagnosis not present

## 2020-04-15 NOTE — Telephone Encounter (Signed)
Noted  

## 2020-04-15 NOTE — Patient Instructions (Signed)
Medication Instructions:  *If you need a refill on your cardiac medications before your next appointment, please call your pharmacy*  Lab Work: If you have labs (blood work) drawn today and your tests are completely normal, you will receive your results only by: . MyChart Message (if you have MyChart) OR . A paper copy in the mail If you have any lab test that is abnormal or we need to change your treatment, we will call you to review the results.  Follow-Up: At [facility], you and your health needs are our priority.  As part of our continuing mission to provide you with exceptional heart care, we have created designated Provider Care Teams.  These Care Teams include your primary Cardiologist (physician) and Advanced Practice Providers (APPs -  Physician Assistants and Nurse Practitioners) who all work together to provide you with the care you need, when you need it.  We recommend signing up for the patient portal called "MyChart".  Sign up information is provided on this After Visit Summary.  MyChart is used to connect with patients for Virtual Visits (Telemedicine).  Patients are able to view lab/test results, encounter notes, upcoming appointments, etc.  Non-urgent messages can be sent to your provider as well.   To learn more about what you can do with MyChart, go to [email or web address].    Your next appointment:   6 month(s)  The format for your next appointment:   In Person  Provider:   You may see [name], MD or one of the following Advanced Practice Providers on your designated Care Team:    [name], NP  [name], NP  [name], NP    

## 2020-04-17 DIAGNOSIS — J8410 Pulmonary fibrosis, unspecified: Secondary | ICD-10-CM | POA: Diagnosis not present

## 2020-04-17 DIAGNOSIS — R0689 Other abnormalities of breathing: Secondary | ICD-10-CM | POA: Diagnosis not present

## 2020-04-21 ENCOUNTER — Ambulatory Visit: Payer: Medicare Other | Admitting: Podiatry

## 2020-04-23 ENCOUNTER — Encounter: Payer: Self-pay | Admitting: Internal Medicine

## 2020-04-23 ENCOUNTER — Ambulatory Visit (INDEPENDENT_AMBULATORY_CARE_PROVIDER_SITE_OTHER): Payer: Medicare Other | Admitting: Internal Medicine

## 2020-04-23 ENCOUNTER — Other Ambulatory Visit: Payer: Self-pay

## 2020-04-23 DIAGNOSIS — R1310 Dysphagia, unspecified: Secondary | ICD-10-CM

## 2020-04-23 DIAGNOSIS — I472 Ventricular tachycardia, unspecified: Secondary | ICD-10-CM | POA: Insufficient documentation

## 2020-04-23 DIAGNOSIS — I5032 Chronic diastolic (congestive) heart failure: Secondary | ICD-10-CM

## 2020-04-23 DIAGNOSIS — I25119 Atherosclerotic heart disease of native coronary artery with unspecified angina pectoris: Secondary | ICD-10-CM | POA: Diagnosis not present

## 2020-04-23 DIAGNOSIS — N1832 Chronic kidney disease, stage 3b: Secondary | ICD-10-CM | POA: Diagnosis not present

## 2020-04-23 NOTE — Assessment & Plan Note (Signed)
A number of spells in the hospital EF is normal Carvedilol increased and doing better

## 2020-04-23 NOTE — Progress Notes (Signed)
Subjective:    Patient ID: [name], male    DOB: [birth year], 87 y.o.   MRN: [id number]  HPI Here with daughter for hospital follow up This visit occurred during the SARS-CoV-2 public health emergency.  Safety protocols were in place, including screening questions prior to the visit, additional usage of staff PPE, and extensive cleaning of exam room while observing appropriate contact time as indicated for disinfecting solutions.   Reviewed hospital records including labs, admission and discharge notes CXR was unremarkable --no other imaging  Had injection in spine  The next day--started with chest pain Called EMS---pain went away so they left (after taking nitro) He called the cardiologist--and he recommended ER evaluation Found to have elevated troponins Sugars up to ~600 BP was elevated---adjustments made to BP meds (amlodipine, carvedilol, hydralazine). Also furosemide No change in typical DOE--no edema Sleeps in recliner---with head elevated. No PND.   Has monitored sugars--- 118-181 fasting this week  Renal function worsened as well  Had bursts of vent tach while in the hospital Daughter felt he was flushed and red but he had no palpitations Carvedilol was increased and this seemed to get better  Current Outpatient Medications on File Prior to Visit  Medication Sig Dispense Refill  . amLODipine (NORVASC) 5 MG tablet Take 5 mg by mouth daily.     [name] aspirin EC 81 MG tablet Take 81 mg by mouth at bedtime.    . Blood Glucose Monitoring Suppl (ONE TOUCH ULTRA 2) w/Device KIT Use to obtain blood sugar daily. Dx Code E11.40 1 kit 0  . carvedilol (COREG) 6.25 MG tablet Take 6.25 mg by mouth 2 (two) times daily.    . clopidogrel (PLAVIX) 75 MG tablet Take 1 tablet (75 mg total) by mouth daily. 90 tablet 3  . DHA-Vitamin C-Lutein (EYE HEALTH FORMULA PO) Take 1 tablet by mouth daily.     . furosemide (LASIX) 40 MG tablet Take 1 tablet (40 mg total) by mouth every Monday,  Wednesday, and Friday. 12 tablet 0  . glucose blood (ONE TOUCH ULTRA TEST) test strip USE TO CHECK BLOOD SUGAR ONCE A DAY Dx Code E11.40 100 each 3  . hydrALAZINE (APRESOLINE) 25 MG tablet Take 1 tablet (25 mg total) by mouth 2 (two) times daily. 60 tablet 0  . HYDROcodone-acetaminophen (NORCO) 10-325 MG tablet Take 2 tablets by mouth 2 (two) times daily as needed for severe pain. Must last 30 days. Max 4 tablets/day (Patient taking differently: Take 2 tablets by mouth in the morning and at bedtime. Must last 30 days. Max 4 tablets/day) 120 tablet 0  . nitroGLYCERIN (NITROSTAT) 0.4 MG SL tablet DISSOLVE 1 TABLET UNDER TONGUE AS NEEDEDFOR CHEST PAIN. MAY REPEAT 5 MINUTES APART 3 TIMES IF NEEDED. IF NO RELIEF CALL 911 (Patient taking differently: Place 0.4 mg under the tongue every 5 (five) minutes as needed for chest pain.) 25 tablet 0  . ondansetron (ZOFRAN) 4 MG tablet TAKE 1 TABLET BY MOUTH EVERY 8 HOURS AS NEEDED FOR NAUSEA OR VOMITING (Patient taking differently: Take 4 mg by mouth every 8 (eight) hours as needed (nausea/vomiting).) 30 tablet 1  . OneTouch Delica Lancets 76B MISC 1 each by In Vitro route daily. Dx Code E11.49 100 each 3  . OXYGEN Inhale into the lungs. Per pt- Uses Oxygen 2 liters at night.    . pantoprazole (PROTONIX) 40 MG tablet Take 1 tablet (40 mg total) by mouth daily. 90 tablet 3  . polyethylene glycol (MIRALAX /  GLYCOLAX) 17 g packet Take 51 g by mouth every other day.     . pregabalin (LYRICA) 25 MG capsule Take 1 capsule (25 mg total) by mouth at bedtime for 20 days, THEN 2 capsules (50 mg total) at bedtime. (Patient taking differently: Take 1 capsule (25 mg) by mouth at bedtime.) 160 capsule 0  . tamsulosin (FLOMAX) 0.4 MG CAPS capsule Take 0.4 mg by mouth at bedtime.      No current facility-administered medications on file prior to visit.    Allergies  Allergen Reactions  . Doxazosin Mesylate Other (See Comments)    dizziness  . Methocarbamol Rash    Past  Medical History:  Diagnosis Date  . Arthritis    osteoarthritis, s/p R TKR, and digits  . CAD (coronary artery disease)    a. s/p CABG (2001)  b. s/p DES to RCA and cutting POBA to ostial PDA (2013)   c. s/p DES to SVG to OM2 (01/14/14) d. cath: 08/2015 NSTEMI w/ patent LIMA-LAD and 99% stenosis of SVG-OM w/ DES placed. CTO of SVG-RCA and SVG-D1.   . Cataract   . Chronic diastolic CHF (congestive heart failure) ([place])    a) 09/13 ECHO- LVEF 33-82%, grade 1 diastolic dysfunction, mild LA dilatation, atrial septal aneurysm, AV mobility restricted, but no sig AS by doppler; b) 09/04/08 ECHO- LVH, ef 60%, mild AS, c. echo 08/2015: EF perserved of 55-60% with inferolateral HK. Mild AS noted.  . Chronic kidney disease, stage III (moderate) ([place])   . Chronic lower back pain   . Colon polyps   . COVID-19   . Diverticulosis   . Dyspnea 2009 since July -Sept   05/06/08-CP[facility]-  normal effort, reduced VO2 max 20.5 /65%, reduced at 8.2/ 40%, normal breathing resetvca of 55%, submaximal heart rate response 112/77%, flattened o2 pluse response at peak exercise-12 ml/beat @ 85%, No VQ mismatch abnormalities, All c/w CIRC Limitation  . Enlarged prostate   . Esophageal stricture    a. s/p dilation spring 2010  . GERD (gastroesophageal reflux disease)   . Heart murmur   . Hiatal hernia   . History of carpal tunnel syndrome    Bilateral  . History of kidney stones   . History of PFTs    mixed pattern on spiro. mild restn on lung volumes with near normal DLCO. Pattern can be explained by CABG scar. Fev1 2.2L/73%, ratio 68 (67), TLC 4.7/68%,RV 1.5L/55%,DLCO 79%  . Hyperlipidemia   . Hypertension   . Interstitial lung disease ([name])    NOS  . Iron deficiency anemia   . Nausea & vomiting    2018/2019  . On home oxygen therapy    2 L [place] at bedtime  . Osteoporosis   . Overweight (BMI 25.0-29.9)    BMI 29  . Peripheral neuropathy   . RA (rheumatoid arthritis) ([name])    [name]  . Seropositive rheumatoid  arthritis ([place])   . Type II diabetes mellitus ([name])    diet controlled  . Wears glasses   . Wears partial dentures    upper    Past Surgical History:  Procedure Laterality Date  . BALLOON DILATION N/A 09/12/2018   Procedure: BALLOON DILATION;  Surgeon: Irene Shipper, MD;  Location: [facility];  Service: Endoscopy;  Laterality: N/A;  . BALLOON DILATION N/A 02/20/2020   Procedure: BALLOON DILATION;  Surgeon: Irene Shipper, MD;  Location: [facility];  Service: Endoscopy;  Laterality: N/A;  . BOTOX INJECTION N/A 09/12/2018  Procedure: BOTOX INJECTION;  Surgeon: Irene Shipper, MD;  Location: [facility];  Service: Endoscopy;  Laterality: N/A;  . BOTOX INJECTION N/A 11/27/2018   Procedure: BOTOX INJECTION;  Surgeon: Irene Shipper, MD;  Location: [facility];  Service: Endoscopy;  Laterality: N/A;  . BOTOX INJECTION N/A 02/20/2020   Procedure: BOTOX INJECTION;  Surgeon: Irene Shipper, MD;  Location: [facility];  Service: Endoscopy;  Laterality: N/A;  . CARDIAC CATHETERIZATION  08/2004   CP- no MI, Cath- small vessell disease   . CARDIAC CATHETERIZATION  12/31/2011   80% distal LM, 100% native LAD, LCx and RCA, 30% prox SVG-OM, SVG-D1 normal, 99% distal, 80% ostial SVG-RCA distal to graft, LIMA-LAD normal; LVEF mildly decreased with posterior basal AK   . CARDIAC CATHETERIZATION  2009   with patent grafts/notes 12/31/2011  . CARDIAC CATHETERIZATION N/A 08/13/2015   Procedure: Left Heart Cath and Cors/Grafts Angiography;  Surgeon: Sherren Mocha, MD;  Location: [facility];  Service: Cardiovascular;  Laterality: N/A;  . CATARACT EXTRACTION W/ INTRAOCULAR LENS  IMPLANT, BILATERAL Bilateral   . CHOLECYSTECTOMY OPEN  11/2003   [name]  . CORONARY ANGIOPLASTY WITH STENT PLACEMENT  01/03/2012   Successful DES to SVG-RCA and cutting balloon angioplasty ostial  PDA   . CORONARY ANGIOPLASTY WITH STENT PLACEMENT  01/14/2014   "1"  . CORONARY ARTERY BYPASS GRAFT  11/1999   CABG X5  . CORONARY  STENT PLACEMENT  02/2012   1 stent and balloon  . ESOPHAGEAL DILATION  11/27/2018   Procedure: ESOPHAGEAL DILATION;  Surgeon: Irene Shipper, MD;  Location: [facility];  Service: Endoscopy;;  . ESOPHAGOGASTRODUODENOSCOPY N/A 03/01/2017   Procedure: ESOPHAGOGASTRODUODENOSCOPY (EGD);  Surgeon: Irene Shipper, MD;  Location: [facility];  Service: Endoscopy;  Laterality: N/A;  . ESOPHAGOGASTRODUODENOSCOPY (EGD) WITH ESOPHAGEAL DILATION  2010  . ESOPHAGOGASTRODUODENOSCOPY (EGD) WITH PROPOFOL N/A 09/12/2018   Procedure: ESOPHAGOGASTRODUODENOSCOPY (EGD) WITH PROPOFOL;  Surgeon: Irene Shipper, MD;  Location: [facility];  Service: Endoscopy;  Laterality: N/A;  . ESOPHAGOGASTRODUODENOSCOPY (EGD) WITH PROPOFOL N/A 11/27/2018   Procedure: ESOPHAGOGASTRODUODENOSCOPY (EGD) WITH PROPOFOL, WITH BALLOON DILATION;  Surgeon: Irene Shipper, MD;  Location: [facility];  Service: Endoscopy;  Laterality: N/A;  . ESOPHAGOGASTRODUODENOSCOPY (EGD) WITH PROPOFOL N/A 02/20/2020   Procedure: ESOPHAGOGASTRODUODENOSCOPY (EGD) WITH PROPOFOL;  Surgeon: Irene Shipper, MD;  Location: [facility];  Service: Endoscopy;  Laterality: N/A;  . HAND SURGERY     bilateral carpal tunnel releases  . JOINT REPLACEMENT    . KNEE ARTHROSCOPY Right 2008  . LEFT AND RIGHT HEART CATHETERIZATION WITH CORONARY ANGIOGRAM  12/31/2011   Procedure: LEFT AND RIGHT HEART CATHETERIZATION WITH CORONARY ANGIOGRAM;  Surgeon: Burnell Blanks, MD;  Location: [facility] CATH LAB;  Service: Cardiovascular;;  . LEFT AND RIGHT HEART CATHETERIZATION WITH CORONARY ANGIOGRAM N/A 01/14/2014   Procedure: LEFT AND RIGHT HEART CATHETERIZATION WITH CORONARY ANGIOGRAM;  Surgeon: Peter M Martinique, MD;  Location: [facility] CATH LAB;  Service: Cardiovascular;  Laterality: N/A;  . MALONEY DILATION  03/01/2017   Procedure: Venia Minks DILATION;  Surgeon: Irene Shipper, MD;  Location: [facility];  Service: Endoscopy;;  . PERCUTANEOUS CORONARY INTERVENTION-BALLOON ONLY  01/03/2012    Procedure: PERCUTANEOUS CORONARY INTERVENTION-BALLOON ONLY;  Surgeon: Peter M Martinique, MD;  Location: [facility] CATH LAB;  Service: Cardiovascular;;  . PERCUTANEOUS CORONARY STENT INTERVENTION (PCI-S)  12/31/2011   Procedure: PERCUTANEOUS CORONARY STENT INTERVENTION (PCI-S);  Surgeon: Burnell Blanks, MD;  Location: [facility] CATH LAB;  Service: Cardiovascular;;  . PERCUTANEOUS CORONARY  STENT INTERVENTION (PCI-S) N/A 01/03/2012   Procedure: PERCUTANEOUS CORONARY STENT INTERVENTION (PCI-S);  Surgeon: Peter M Martinique, MD;  Location: [facility] CATH LAB;  Service: Cardiovascular;  Laterality: N/A;  . SHOULDER ARTHROSCOPY WITH OPEN ROTATOR CUFF REPAIR AND DISTAL CLAVICLE ACROMINECTOMY Left 02/27/2013   Procedure: LEFT SHOULDER ARTHROSCOPY WITH MINI OPEN ROTATOR CUFF REPAIR AND SUBACROMIAL DECOMPRESSION AND DISTAL CLAVICLE RESECTION;  Surgeon: [name], MD;  Location: [facility];  Service: Orthopedics;  Laterality: Left;  . TOTAL KNEE ARTHROPLASTY Right 03/2010   [name]  . TRIGGER FINGER RELEASE Left 02/27/2013   Procedure: RELEASE TRIGGER FINGER/A-1 PULLEY;  Surgeon: [name], MD;  Location: [facility];  Service: Orthopedics;  Laterality: Left;    Family History  Problem Relation Age of Onset  . COPD Mother   . Heart disease Father   . Heart attack Father   . Stomach cancer Brother   . Stroke Sister   . Alcohol abuse Sister   . Colon cancer Brother 25  . Diabetes Brother   . Rectal cancer Neg Hx     Social History   Socioeconomic History  . Marital status: Married    Spouse name: Not on file  . Number of children: 3  . Years of education: Not on file  . Highest education level: Not on file  Occupational History  . Occupation: Designer, jewellery: RETIRED    Comment: retired  Tobacco Use  . Smoking status: Former Smoker    Packs/day: 1.00    Years: 20.00    Pack years: 20.00    Types: Cigarettes    Quit date: 04/06/1963    Years since quitting: 57.0  . Smokeless tobacco: Former  Network engineer  . Vaping Use: Never used  Substance and Sexual Activity  . Alcohol use: No    Alcohol/week: 0.0 standard drinks    Comment: 01/01/2012 "last alcohol ~ 50 yr ago"  . Drug use: No  . Sexual activity: Not Currently  Other Topics Concern  . Not on file  Social History Narrative   No living will   Requests wife as health care POA-- alternate is daughter Hassan Rowan   Discussed DNR --he requests this (done 08/29/12)   Not sure about feeding tube---but might accept for some time   Patient lives with wife and daughter in a one story home.  Has 3 children.  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   Social Determinants of Health   Financial Resource Strain: Low Risk   . Difficulty of Paying Living Expenses: Not very hard  Food Insecurity: Not on file  Transportation Needs: Not on file  Physical Activity: Not on file  Stress: Not on file  Social Connections: Not on file  Intimate Partner Violence: Not on file  ; Review of Systems No headaches Nocturia x 1-2 and stable Eating okay--has lost 4# since then Eating smaller bites and soft foods to keep from stimulating his esophagus Lots of gas--was going to check with Dr Henrene Pastor about this    Objective:   Physical Exam Constitutional:      Appearance: Normal appearance.  Cardiovascular:     Rate and Rhythm: Normal rate and regular rhythm.     Heart sounds: No gallop.      Comments: Soft aortic systolic murmur Pulmonary:     Effort: Pulmonary effort is normal.     Breath sounds: No wheezing.     Comments: Decreased breath sounds and bibasilar  early inspiratory crackles Musculoskeletal:     Cervical back: Neck supple.     Right lower leg: No edema.     Left lower leg: No edema.  Lymphadenopathy:     Cervical: No cervical adenopathy.  Neurological:     Mental Status: He is alert.  Psychiatric:        Mood and Affect: Mood normal.        Behavior: Behavior normal.            Assessment & Plan:

## 2020-04-23 NOTE — Assessment & Plan Note (Signed)
Admitted for chest pain after steroid injection in back Sounds like it might have been stable angina (related to BP and sugar elevations?) Dr Johnsie Cancel thinks chest pain may have been non cardiac No action needed about this

## 2020-04-23 NOTE — Assessment & Plan Note (Signed)
Will follow up with Dr Henrene Pastor about this

## 2020-04-23 NOTE — Assessment & Plan Note (Addendum)
Had brief reduction of GFR under 30--but back to 32 Was taken off losartan but he has restarted it

## 2020-04-23 NOTE — Assessment & Plan Note (Signed)
This seems compensated on lasix 3 days per week On hydralazine as well Off losartan due to decreased renal function

## 2020-04-24 ENCOUNTER — Ambulatory Visit: Payer: Medicare Other | Admitting: Podiatry

## 2020-04-28 DIAGNOSIS — H35372 Puckering of macula, left eye: Secondary | ICD-10-CM | POA: Diagnosis not present

## 2020-04-28 DIAGNOSIS — H353213 Exudative age-related macular degeneration, right eye, with inactive scar: Secondary | ICD-10-CM | POA: Diagnosis not present

## 2020-04-28 DIAGNOSIS — D3131 Benign neoplasm of right choroid: Secondary | ICD-10-CM | POA: Diagnosis not present

## 2020-04-28 DIAGNOSIS — H353221 Exudative age-related macular degeneration, left eye, with active choroidal neovascularization: Secondary | ICD-10-CM | POA: Diagnosis not present

## 2020-04-28 NOTE — Progress Notes (Deleted)
Office Visit Note  Patient: [name]             Date of Birth: 08-24-1931           MRN: [id number]             PCP: [name], MD Referring: [name], MD Visit Date: 05/12/2020 Occupation: @GUAROCC @  Subjective:  No chief complaint on file.   History of Present Illness: [name] is a 87 y.o. male ***   Activities of Daily Living:  Patient reports morning stiffness for *** {minute/hour:19697}.   Patient {ACTIONS;DENIES/REPORTS:21021675::"Denies"} nocturnal pain.  Difficulty dressing/grooming: {ACTIONS;DENIES/REPORTS:21021675::"Denies"} Difficulty climbing stairs: {ACTIONS;DENIES/REPORTS:21021675::"Denies"} Difficulty getting out of chair: {ACTIONS;DENIES/REPORTS:21021675::"Denies"} Difficulty using hands for taps, buttons, cutlery, and/or writing: {ACTIONS;DENIES/REPORTS:21021675::"Denies"}  No Rheumatology ROS completed.   PMFS History:  Patient Active Problem List   Diagnosis Date Noted  . Ventricular tachycardia ([place]) 04/23/2020  . Chest pain 04/10/2020  . Chronic narcotic dependence ([place]) 12/14/2019  . Callus of foot 11/08/2019  . Plantar flexed metatarsal bone of left foot 11/08/2019  . COVID-19 virus infection 04/12/2019  . Long term prescription opiate use 12/14/2018  . Neuropathy 12/14/2018  . Stage 3b chronic kidney disease ([place]) 12/14/2018  . Problems with swallowing and mastication   . Achalasia   . Right inguinal hernia 07/04/2018  . Advance directive discussed with patient 04/28/2018  . Chronic pain of both lower extremities 10/26/2017  . Neuropathic pain 10/26/2017  . Chronic pain syndrome 10/26/2017  . Iron deficiency anemia 07/05/2017  . Constipation 07/05/2017  . CKD (chronic kidney disease) stage 4, GFR 15-29 ml/min ([name]) 05/17/2017  . Seropositive rheumatoid arthritis ([place]) 05/17/2017  . DM (diabetes mellitus) type II controlled, neurological manifestation ([name]) 05/17/2017  . Atherosclerotic heart disease of native  coronary artery with angina pectoris ([place]) 05/17/2017  . Dysphagia 05/17/2017  . High risk medication use 10/21/2016  . Primary osteoarthritis of both knees 10/21/2016  . History of right knee joint replacement 10/21/2016  . Orthostatic hypotension 10/01/2016  . Tegretol-induced dizziness 05/14/2016  . Spinal stenosis of lumbar region without neurogenic claudication 03/23/2016  . Lumbar facet arthropathy 03/23/2016  . Respiratory failure, chronic ([place]) 11/21/2014  . Rheumatoid arthritis ([place]) 11/05/2014  . Rheumatic fever without heart involvement 03/04/2014  . Ventral hernia 12/17/2013  . Routine general medical examination at a health care facility 08/29/2012  . Routine history and physical examination of adult 08/29/2012  . ILD (interstitial lung disease) ([place]) 11/28/2011  . Chronic diastolic heart failure ([place]) 09/14/2011  . Diabetic polyneuropathy associated with type 2 diabetes mellitus ([place]) 08/10/2010  . Obstructive sleep apnea 12/17/2008  . ESOPHAGEAL STRICTURE 10/09/2008  . Esophageal stricture 10/09/2008  . Reflux esophagitis 09/10/2008  . Diverticulosis of colon 09/10/2008  . Gastro-esophageal reflux disease with esophagitis 09/10/2008  . Coronary atherosclerosis 03/19/2008  . Aortic valve disorder 03/19/2008  . Thoracic aorta atherosclerosis ([place]) 03/19/2008  . Actinic keratosis 10/23/2007  . SLEEP DISORDER, CHRONIC 10/17/2006  . Disturbance in sleep behavior 10/17/2006  . 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 Essential hypertension 09/23/2006  . GERD 09/23/2006  . BENIGN PROSTATIC HYPERTROPHY 09/23/2006  . Enlarged prostate without lower urinary tract symptoms (luts) 09/23/2006  . HLD (hyperlipidemia) 09/23/2006    Past Medical History:  Diagnosis Date  . Arthritis    osteoarthritis, s/p R TKR, and digits  . CAD (coronary artery disease)    a. s/p CABG (2001)  b. s/p DES to RCA and cutting POBA to ostial PDA (  2013)   c. s/p DES to SVG  to OM2 (01/14/14) d. cath: 08/2015 NSTEMI w/ patent LIMA-LAD and 99% stenosis of SVG-OM w/ DES placed. CTO of SVG-RCA and SVG-D1.   . Cataract   . Chronic diastolic CHF (congestive heart failure) ([place])    a) 09/13 ECHO- LVEF 31-49%, grade 1 diastolic dysfunction, mild LA dilatation, atrial septal aneurysm, AV mobility restricted, but no sig AS by doppler; b) 09/04/08 ECHO- LVH, ef 60%, mild AS, c. echo 08/2015: EF perserved of 55-60% with inferolateral HK. Mild AS noted.  . Chronic kidney disease, stage III (moderate) ([place])   . Chronic lower back pain   . Colon polyps   . COVID-19   . Diverticulosis   . Dyspnea 2009 since July -Sept   05/06/08-CPST-  normal effort, reduced VO2 max 20.5 /65%, reduced at 8.2/ 40%, normal breathing resetvca of 55%, submaximal heart rate response 112/77%, flattened o2 pluse response at peak exercise-12 ml/beat @ 85%, No VQ mismatch abnormalities, All c/w CIRC Limitation  . Enlarged prostate   . Esophageal stricture    a. s/p dilation spring 2010  . GERD (gastroesophageal reflux disease)   . Heart murmur   . Hiatal hernia   . History of carpal tunnel syndrome    Bilateral  . History of kidney stones   . History of PFTs    mixed pattern on spiro. mild restn on lung volumes with near normal DLCO. Pattern can be explained by CABG scar. Fev1 2.2L/73%, ratio 68 (67), TLC 4.7/68%,RV 1.5L/55%,DLCO 79%  . Hyperlipidemia   . Hypertension   . Interstitial lung disease ([name])    NOS  . Iron deficiency anemia   . Nausea & vomiting    2018/2019  . On home oxygen therapy    2 L [place] at bedtime  . Osteoporosis   . Overweight (BMI 25.0-29.9)    BMI 29  . Peripheral neuropathy   . RA (rheumatoid arthritis) ([name])    [name]  . Seropositive rheumatoid arthritis ([place])   . Type II diabetes mellitus ([name])    diet controlled  . Wears glasses   . Wears partial dentures    upper    Family History  Problem Relation Age of Onset  . COPD Mother   . Heart disease Father    . Heart attack Father   . Stomach cancer Brother   . Stroke Sister   . Alcohol abuse Sister   . Colon cancer Brother 25  . Diabetes Brother   . Rectal cancer Neg Hx    Past Surgical History:  Procedure Laterality Date  . BALLOON DILATION N/A 09/12/2018   Procedure: BALLOON DILATION;  Surgeon: Irene Shipper, MD;  Location: [facility];  Service: Endoscopy;  Laterality: N/A;  . BALLOON DILATION N/A 02/20/2020   Procedure: BALLOON DILATION;  Surgeon: Irene Shipper, MD;  Location: [facility];  Service: Endoscopy;  Laterality: N/A;  . BOTOX INJECTION N/A 09/12/2018   Procedure: BOTOX INJECTION;  Surgeon: Irene Shipper, MD;  Location: [facility];  Service: Endoscopy;  Laterality: N/A;  . BOTOX INJECTION N/A 11/27/2018   Procedure: BOTOX INJECTION;  Surgeon: Irene Shipper, MD;  Location: [facility];  Service: Endoscopy;  Laterality: N/A;  . BOTOX INJECTION N/A 02/20/2020   Procedure: BOTOX INJECTION;  Surgeon: Irene Shipper, MD;  Location: [facility];  Service: Endoscopy;  Laterality: N/A;  . CARDIAC CATHETERIZATION  08/2004   CP- no MI, Cath- small vessell disease   . CARDIAC  CATHETERIZATION  12/31/2011   80% distal LM, 100% native LAD, LCx and RCA, 30% prox SVG-OM, SVG-D1 normal, 99% distal, 80% ostial SVG-RCA distal to graft, LIMA-LAD normal; LVEF mildly decreased with posterior basal AK   . CARDIAC CATHETERIZATION  2009   with patent grafts/notes 12/31/2011  . CARDIAC CATHETERIZATION N/A 08/13/2015   Procedure: Left Heart Cath and Cors/Grafts Angiography;  Surgeon: Sherren Mocha, MD;  Location: [facility];  Service: Cardiovascular;  Laterality: N/A;  . CATARACT EXTRACTION W/ INTRAOCULAR LENS  IMPLANT, BILATERAL Bilateral   . CHOLECYSTECTOMY OPEN  11/2003   [name]  . CORONARY ANGIOPLASTY WITH STENT PLACEMENT  01/03/2012   Successful DES to SVG-RCA and cutting balloon angioplasty ostial  PDA   . CORONARY ANGIOPLASTY WITH STENT PLACEMENT  01/14/2014   "1"  . CORONARY ARTERY  BYPASS GRAFT  11/1999   CABG X5  . CORONARY STENT PLACEMENT  02/2012   1 stent and balloon  . ESOPHAGEAL DILATION  11/27/2018   Procedure: ESOPHAGEAL DILATION;  Surgeon: Irene Shipper, MD;  Location: [facility];  Service: Endoscopy;;  . ESOPHAGOGASTRODUODENOSCOPY N/A 03/01/2017   Procedure: ESOPHAGOGASTRODUODENOSCOPY (EGD);  Surgeon: Irene Shipper, MD;  Location: [facility];  Service: Endoscopy;  Laterality: N/A;  . ESOPHAGOGASTRODUODENOSCOPY (EGD) WITH ESOPHAGEAL DILATION  2010  . ESOPHAGOGASTRODUODENOSCOPY (EGD) WITH PROPOFOL N/A 09/12/2018   Procedure: ESOPHAGOGASTRODUODENOSCOPY (EGD) WITH PROPOFOL;  Surgeon: Irene Shipper, MD;  Location: [facility];  Service: Endoscopy;  Laterality: N/A;  . ESOPHAGOGASTRODUODENOSCOPY (EGD) WITH PROPOFOL N/A 11/27/2018   Procedure: ESOPHAGOGASTRODUODENOSCOPY (EGD) WITH PROPOFOL, WITH BALLOON DILATION;  Surgeon: Irene Shipper, MD;  Location: [facility];  Service: Endoscopy;  Laterality: N/A;  . ESOPHAGOGASTRODUODENOSCOPY (EGD) WITH PROPOFOL N/A 02/20/2020   Procedure: ESOPHAGOGASTRODUODENOSCOPY (EGD) WITH PROPOFOL;  Surgeon: Irene Shipper, MD;  Location: [facility];  Service: Endoscopy;  Laterality: N/A;  . HAND SURGERY     bilateral carpal tunnel releases  . JOINT REPLACEMENT    . KNEE ARTHROSCOPY Right 2008  . LEFT AND RIGHT HEART CATHETERIZATION WITH CORONARY ANGIOGRAM  12/31/2011   Procedure: LEFT AND RIGHT HEART CATHETERIZATION WITH CORONARY ANGIOGRAM;  Surgeon: Burnell Blanks, MD;  Location: [facility] CATH LAB;  Service: Cardiovascular;;  . LEFT AND RIGHT HEART CATHETERIZATION WITH CORONARY ANGIOGRAM N/A 01/14/2014   Procedure: LEFT AND RIGHT HEART CATHETERIZATION WITH CORONARY ANGIOGRAM;  Surgeon: Peter M Martinique, MD;  Location: [facility] CATH LAB;  Service: Cardiovascular;  Laterality: N/A;  . MALONEY DILATION  03/01/2017   Procedure: Venia Minks DILATION;  Surgeon: Irene Shipper, MD;  Location: [facility];  Service: Endoscopy;;  . PERCUTANEOUS CORONARY  INTERVENTION-BALLOON ONLY  01/03/2012   Procedure: PERCUTANEOUS CORONARY INTERVENTION-BALLOON ONLY;  Surgeon: Peter M Martinique, MD;  Location: [facility] CATH LAB;  Service: Cardiovascular;;  . PERCUTANEOUS CORONARY STENT INTERVENTION (PCI-S)  12/31/2011   Procedure: PERCUTANEOUS CORONARY STENT INTERVENTION (PCI-S);  Surgeon: Burnell Blanks, MD;  Location: [facility] CATH LAB;  Service: Cardiovascular;;  . PERCUTANEOUS CORONARY STENT INTERVENTION (PCI-S) N/A 01/03/2012   Procedure: PERCUTANEOUS CORONARY STENT INTERVENTION (PCI-S);  Surgeon: Peter M Martinique, MD;  Location[facility] CATH LAB;  Service: Cardiovascular;  Laterality: N/A;  . SHOULDER ARTHROSCOPY WITH OPEN ROTATOR CUFF REPAIR AND DISTAL CLAVICLE ACROMINECTOMY Left 02/27/2013   Procedure: LEFT SHOULDER ARTHROSCOPY WITH MINI OPEN ROTATOR CUFF REPAIR AND SUBACROMIAL DECOMPRESSION AND DISTAL CLAVICLE RESECTION;  Surgeon: [name], MD;  Location: [facility];  Service: Orthopedics;  Laterality: Left;  . TOTAL KNEE ARTHROPLASTY Right 03/2010   [name]  .  TRIGGER FINGER RELEASE Left 02/27/2013   Procedure: RELEASE TRIGGER FINGER/A-1 PULLEY;  Surgeon: [name], MD;  Location: [facility];  Service: Orthopedics;  Laterality: Left;   Social History   Social History Narrative   No living will   Requests wife as health care POA-- alternate is daughter Hassan Rowan   Discussed DNR --he requests this (done 08/29/12)   Not sure about feeding tube---but might accept for some time   Patient lives with wife and daughter in a one story home.  Has 3 children.  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   Immunization History  Administered Date(s) Administered  . H1N1 03/27/2008  . Influenza Split 12/29/2010, 01/18/2012  . Influenza Whole 02/03/2007, 01/09/2008, 01/01/2009, 12/31/2009  . Influenza,inj,Quad PF,6+ Mos 12/13/2012, 01/15/2014, 01/15/2015, 01/06/2016, 01/03/2017, 02/09/2018, 01/30/2019, 01/05/2020  . PFIZER(Purple Top)SARS-COV-2 Vaccination  04/26/2019, 05/17/2019  . Pneumococcal Conjugate-13 09/10/2013  . Pneumococcal Polysaccharide-23 03/05/1997, 08/17/2011  . Td 03/05/1997, 08/17/2011     Objective: Vital Signs: There were no vitals taken for this visit.   Physical Exam   Musculoskeletal Exam: ***  CDAI Exam: CDAI Score: -- Patient Global: --; Provider Global: -- Swollen: --; Tender: -- Joint Exam 05/12/2020   No joint exam has been documented for this visit   There is currently no information documented on the homunculus. Go to the Rheumatology activity and complete the homunculus joint exam.  Investigation: No additional findings.  Imaging: DG Chest 2 View  Result Date: 04/10/2020 CLINICAL DATA:  Acute chest pain and shortness of breath for 1 day. EXAM: CHEST - 2 VIEW COMPARISON:  04/03/2019 chest radiograph and prior studies FINDINGS: The cardiomediastinal silhouette is unremarkable. CABG changes are again noted. There is no evidence of focal airspace disease, pulmonary edema, suspicious pulmonary nodule/mass, pleural effusion, or pneumothorax. No acute bony abnormalities are identified. IMPRESSION: No active cardiopulmonary disease. Electronically Signed   By: Margarette Canada M.D.   On: 04/10/2020 14:45   DG PAIN CLINIC C-ARM 1-60 MIN NO REPORT  Result Date: 04/09/2020 Fluoro was used, but no Radiologist interpretation will be provided. Please refer to "NOTES" tab for provider progress note.  ECHOCARDIOGRAM COMPLETE  Result Date: 04/11/2020    ECHOCARDIOGRAM REPORT   Patient Name:   SIMS LADAY Date of Exam: 04/11/2020 Medical Rec #:  [id number]       Height:       72.0 in Accession #:    [id number]      Weight:       201.0 lb Date of Birth:  September 18, 1931       BSA:          2.135 m Patient Age:    9 years        BP:           155/73 mmHg Patient Gender: M               HR:           54 bpm. Exam Location:  Inpatient Procedure: 2D Echo Indications:    chest pain  History:        Patient has prior history of  Echocardiogram examinations, most                 recent 01/26/2018. Chronic kidney disease. Interstitial lung                 disease., Aortic Valve Disease; Risk Factors:Sleep Apnea,  Diabetes and Hypertension.  Sonographer:    Johny Chess Referring Phys: [place]  1. Left ventricular ejection fraction, by estimation, is 55 to 60%. The left ventricle has normal function. The left ventricle has no regional wall motion abnormalities. There is mild left ventricular hypertrophy. Left ventricular diastolic parameters are consistent with Grade I diastolic dysfunction (impaired relaxation).  2. Right ventricular systolic function is mildly reduced. The right ventricular size is normal. There is normal pulmonary artery systolic pressure. The estimated right ventricular systolic pressure is 36.6 mmHg.  3. Left atrial size was mildly dilated.  4. The mitral valve is normal in structure. Trivial mitral valve regurgitation. No evidence of mitral stenosis.  5. The aortic valve is tricuspid. Aortic valve regurgitation is trivial. Mild aortic valve stenosis. Aortic valve mean gradient measures 14.0 mmHg.  6. The inferior vena cava is normal in size with greater than 50% respiratory variability, suggesting right atrial pressure of 3 mmHg. FINDINGS  Left Ventricle: Left ventricular ejection fraction, by estimation, is 55 to 60%. The left ventricle has normal function. The left ventricle has no regional wall motion abnormalities. The left ventricular internal cavity size was normal in size. There is  mild left ventricular hypertrophy. Left ventricular diastolic parameters are consistent with Grade I diastolic dysfunction (impaired relaxation). Right Ventricle: The right ventricular size is normal. No increase in right ventricular wall thickness. Right ventricular systolic function is mildly reduced. There is normal pulmonary artery systolic pressure. The tricuspid regurgitant velocity  is 2.47 m/s, and with an assumed right atrial pressure of 3 mmHg, the estimated right ventricular systolic pressure is 29.4 mmHg. Left Atrium: Left atrial size was mildly dilated. Right Atrium: Right atrial size was normal in size. Pericardium: There is no evidence of pericardial effusion. Mitral Valve: The mitral valve is normal in structure. There is mild calcification of the mitral valve leaflet(s). Mild mitral annular calcification. Trivial mitral valve regurgitation. No evidence of mitral valve stenosis. Tricuspid Valve: The tricuspid valve is normal in structure. Tricuspid valve regurgitation is trivial. Aortic Valve: The aortic valve is tricuspid. Aortic valve regurgitation is trivial. Mild aortic stenosis is present. Aortic valve mean gradient measures 14.0 mmHg. Aortic valve peak gradient measures 22.2 mmHg. Aortic valve area, by VTI measures 1.22 cm. Pulmonic Valve: The pulmonic valve was normal in structure. Pulmonic valve regurgitation is trivial. Aorta: The aortic root is normal in size and structure. Venous: The inferior vena cava is normal in size with greater than 50% respiratory variability, suggesting right atrial pressure of 3 mmHg. IAS/Shunts: No atrial level shunt detected by color flow Doppler.  LEFT VENTRICLE PLAX 2D LVIDd:         4.90 cm  Diastology LVIDs:         3.40 cm  LV e' medial:    5.66 cm/s LV PW:         1.20 cm  LV E/e' medial:  12.2 LV IVS:        1.00 cm  LV e' lateral:   9.36 cm/s LVOT diam:     2.20 cm  LV E/e' lateral: 7.4 LV SV:         72 LV SV Index:   34 LVOT Area:     3.80 cm  RIGHT VENTRICLE             IVC RV S prime:     10.00 cm/s  IVC diam: 1.50 cm TAPSE (M-mode): 1.2 cm LEFT ATRIUM  Index       RIGHT ATRIUM           Index LA diam:        4.10 cm 1.92 cm/m  RA Area:     12.70 cm LA Vol (A2C):   65.0 ml 30.44 ml/m RA Volume:   29.60 ml  13.86 ml/m LA Vol (A4C):   43.7 ml 20.47 ml/m LA Biplane Vol: 54.5 ml 25.53 ml/m  AORTIC VALVE AV Area (Vmax):     1.20 cm AV Area (Vmean):   1.15 cm AV Area (VTI):     1.22 cm AV Vmax:           235.50 cm/s AV Vmean:          169.500 cm/s AV VTI:            0.587 m AV Peak Grad:      22.2 mmHg AV Mean Grad:      14.0 mmHg LVOT Vmax:         74.45 cm/s LVOT Vmean:        51.200 cm/s LVOT VTI:          0.189 m LVOT/AV VTI ratio: 0.32  AORTA Ao Root diam: 3.20 cm MITRAL VALVE               TRICUSPID VALVE MV Area (PHT): 2.00 cm    TR Peak grad:   24.4 mmHg MV Decel Time: 380 msec    TR Vmax:        247.00 cm/s MV E velocity: 69.00 cm/s MV A velocity: 94.30 cm/s  SHUNTS MV E/A ratio:  0.73        Systemic VTI:  0.19 m                            Systemic Diam: 2.20 cm Loralie Champagne MD Electronically signed by Loralie Champagne MD Signature Date/Time: 04/11/2020/5:58:20 PM    Final     Recent Labs: Lab Results  Component Value Date   WBC 10.4 04/12/2020   HGB 14.1 04/12/2020   PLT 182 04/12/2020   NA 138 04/13/2020   K 4.2 04/13/2020   CL 102 04/13/2020   CO2 27 04/13/2020   GLUCOSE 211 (H) 04/13/2020   BUN 39 (H) 04/13/2020   CREATININE 2.00 (H) 04/13/2020   BILITOT 0.8 06/13/2019   ALKPHOS 58 06/13/2019   AST 14 06/13/2019   ALT 9 06/13/2019   PROT 8.0 06/13/2019   ALBUMIN 4.1 06/13/2019   ALBUMIN 4.1 06/13/2019   CALCIUM 9.4 04/13/2020   GFRAA 30 (L) 06/24/2017    Speciality Comments: D/c Imuran and Cellcept in the past due to GI SE  Procedures:  No procedures performed Allergies: Doxazosin mesylate and Methocarbamol   Assessment / Plan:     Visit Diagnoses: No diagnosis found.  Orders: No orders of the defined types were placed in this encounter.  No orders of the defined types were placed in this encounter.   Face-to-face time spent with patient was *** minutes. Greater than 50% of time was spent in counseling and coordination of care.  Follow-Up Instructions: No follow-ups on file.   Earnestine Mealing, CMA  Note - This record has been created using Editor, commissioning.  Chart creation  errors have been sought, but may not always  have been located. Such creation errors do not reflect on  the standard of medical care.

## 2020-04-30 ENCOUNTER — Emergency Department (HOSPITAL_COMMUNITY): Payer: Medicare Other

## 2020-04-30 ENCOUNTER — Emergency Department (HOSPITAL_COMMUNITY)
Admission: EM | Admit: 2020-04-30 | Discharge: 2020-04-30 | Disposition: A | Discharge status: Home / Self Care | Payer: Medicare Other | Attending: Emergency Medicine | Admitting: Emergency Medicine

## 2020-04-30 ENCOUNTER — Encounter (HOSPITAL_COMMUNITY): Payer: Self-pay | Admitting: Emergency Medicine

## 2020-04-30 DIAGNOSIS — W19XXXA Unspecified fall, initial encounter: Secondary | ICD-10-CM

## 2020-04-30 DIAGNOSIS — Y9301 Activity, walking, marching and hiking: Secondary | ICD-10-CM | POA: Insufficient documentation

## 2020-04-30 DIAGNOSIS — Z7982 Long term (current) use of aspirin: Secondary | ICD-10-CM | POA: Insufficient documentation

## 2020-04-30 DIAGNOSIS — I10 Essential (primary) hypertension: Secondary | ICD-10-CM | POA: Diagnosis not present

## 2020-04-30 DIAGNOSIS — E1142 Type 2 diabetes mellitus with diabetic polyneuropathy: Secondary | ICD-10-CM | POA: Diagnosis not present

## 2020-04-30 DIAGNOSIS — Z96651 Presence of right artificial knee joint: Secondary | ICD-10-CM | POA: Insufficient documentation

## 2020-04-30 DIAGNOSIS — Y92019 Unspecified place in single-family (private) house as the place of occurrence of the external cause: Secondary | ICD-10-CM | POA: Insufficient documentation

## 2020-04-30 DIAGNOSIS — Z955 Presence of coronary angioplasty implant and graft: Secondary | ICD-10-CM | POA: Diagnosis not present

## 2020-04-30 DIAGNOSIS — R42 Dizziness and giddiness: Secondary | ICD-10-CM | POA: Insufficient documentation

## 2020-04-30 DIAGNOSIS — Z8616 Personal history of COVID-19: Secondary | ICD-10-CM | POA: Insufficient documentation

## 2020-04-30 DIAGNOSIS — I5032 Chronic diastolic (congestive) heart failure: Secondary | ICD-10-CM | POA: Insufficient documentation

## 2020-04-30 DIAGNOSIS — I130 Hypertensive heart and chronic kidney disease with heart failure and stage 1 through stage 4 chronic kidney disease, or unspecified chronic kidney disease: Secondary | ICD-10-CM | POA: Insufficient documentation

## 2020-04-30 DIAGNOSIS — Z951 Presence of aortocoronary bypass graft: Secondary | ICD-10-CM | POA: Diagnosis not present

## 2020-04-30 DIAGNOSIS — E785 Hyperlipidemia, unspecified: Secondary | ICD-10-CM | POA: Insufficient documentation

## 2020-04-30 DIAGNOSIS — M545 Low back pain, unspecified: Secondary | ICD-10-CM | POA: Insufficient documentation

## 2020-04-30 DIAGNOSIS — N184 Chronic kidney disease, stage 4 (severe): Secondary | ICD-10-CM | POA: Insufficient documentation

## 2020-04-30 DIAGNOSIS — E1136 Type 2 diabetes mellitus with diabetic cataract: Secondary | ICD-10-CM | POA: Insufficient documentation

## 2020-04-30 DIAGNOSIS — R52 Pain, unspecified: Secondary | ICD-10-CM | POA: Diagnosis not present

## 2020-04-30 DIAGNOSIS — Z7901 Long term (current) use of anticoagulants: Secondary | ICD-10-CM | POA: Insufficient documentation

## 2020-04-30 DIAGNOSIS — S0990XA Unspecified injury of head, initial encounter: Secondary | ICD-10-CM

## 2020-04-30 DIAGNOSIS — Z79899 Other long term (current) drug therapy: Secondary | ICD-10-CM | POA: Insufficient documentation

## 2020-04-30 DIAGNOSIS — M549 Dorsalgia, unspecified: Secondary | ICD-10-CM | POA: Diagnosis not present

## 2020-04-30 DIAGNOSIS — W01198A Fall on same level from slipping, tripping and stumbling with subsequent striking against other object, initial encounter: Secondary | ICD-10-CM | POA: Insufficient documentation

## 2020-04-30 DIAGNOSIS — M5459 Other low back pain: Secondary | ICD-10-CM | POA: Diagnosis not present

## 2020-04-30 DIAGNOSIS — I25119 Atherosclerotic heart disease of native coronary artery with unspecified angina pectoris: Secondary | ICD-10-CM | POA: Insufficient documentation

## 2020-04-30 DIAGNOSIS — Z743 Need for continuous supervision: Secondary | ICD-10-CM | POA: Diagnosis not present

## 2020-04-30 DIAGNOSIS — E1169 Type 2 diabetes mellitus with other specified complication: Secondary | ICD-10-CM | POA: Insufficient documentation

## 2020-04-30 DIAGNOSIS — R0902 Hypoxemia: Secondary | ICD-10-CM | POA: Diagnosis not present

## 2020-04-30 DIAGNOSIS — E1122 Type 2 diabetes mellitus with diabetic chronic kidney disease: Secondary | ICD-10-CM | POA: Diagnosis not present

## 2020-04-30 DIAGNOSIS — Z043 Encounter for examination and observation following other accident: Secondary | ICD-10-CM | POA: Diagnosis not present

## 2020-04-30 DIAGNOSIS — Z87891 Personal history of nicotine dependence: Secondary | ICD-10-CM | POA: Diagnosis not present

## 2020-04-30 DIAGNOSIS — R404 Transient alteration of awareness: Secondary | ICD-10-CM | POA: Diagnosis not present

## 2020-04-30 LAB — CBC
HCT: 45 % (ref 39.0–52.0)
Hemoglobin: 15.2 g/dL (ref 13.0–17.0)
MCH: 29.3 pg (ref 26.0–34.0)
MCHC: 33.8 g/dL (ref 30.0–36.0)
MCV: 86.9 fL (ref 80.0–100.0)
Platelets: 195 10*3/uL (ref 150–400)
RBC: 5.18 MIL/uL (ref 4.22–5.81)
RDW: 13 % (ref 11.5–15.5)
WBC: 9.4 10*3/uL (ref 4.0–10.5)
nRBC: 0 % (ref 0.0–0.2)

## 2020-04-30 LAB — BASIC METABOLIC PANEL
Anion gap: 11 (ref 5–15)
BUN: 38 mg/dL — ABNORMAL HIGH (ref 8–23)
CO2: 29 mmol/L (ref 22–32)
Calcium: 9.9 mg/dL (ref 8.9–10.3)
Chloride: 99 mmol/L (ref 98–111)
Creatinine, Ser: 2.11 mg/dL — ABNORMAL HIGH (ref 0.61–1.24)
GFR, Estimated: 30 mL/min — ABNORMAL LOW (ref >60)
Glucose, Bld: 252 mg/dL — ABNORMAL HIGH (ref 70–99)
Potassium: 4 mmol/L (ref 3.5–5.1)
Sodium: 139 mmol/L (ref 135–145)

## 2020-04-30 LAB — URINALYSIS, ROUTINE W REFLEX MICROSCOPIC
Bacteria, UA: NONE SEEN
Bilirubin Urine: NEGATIVE
Glucose, UA: >=500 mg/dL — AB
Ketones, ur: NEGATIVE mg/dL
Leukocytes,Ua: NEGATIVE
Nitrite: NEGATIVE
Protein, ur: 30 mg/dL — AB
Specific Gravity, Urine: 1.014 (ref 1.005–1.030)
pH: 6 (ref 5.0–8.0)

## 2020-04-30 LAB — CBG MONITORING, ED: Glucose-Capillary: 253 mg/dL — ABNORMAL HIGH (ref 70–99)

## 2020-04-30 MED ORDER — HYDROCODONE-ACETAMINOPHEN 5-325 MG PO TABS
2.0000 | ORAL_TABLET | Freq: Once | ORAL | Status: AC
Start: 2020-04-30 — End: 2020-04-30
  Administered 2020-04-30: 2 via ORAL
  Filled 2020-04-30: qty 2

## 2020-04-30 MED ORDER — SODIUM CHLORIDE 0.9 % IV BOLUS
1000.0000 mL | Freq: Once | INTRAVENOUS | Status: AC
  Administered 2020-04-30: 1000 mL via INTRAVENOUS

## 2020-04-30 NOTE — ED Notes (Signed)
Pt in CT.

## 2020-04-30 NOTE — Discharge Instructions (Signed)
Your lab work today overall looks good, head scan and x-rays look good as well.  It does seem that you were a bit dehydrated and this could have led to you feeling a bit woozy and falling last night.  You were given IV fluids today, make sure you are drinking plenty of fluids at home and make sure you stand up slowly and give yourself time to adjust before you start walking to prevent future falls.  Please follow-up with your primary care doctor.  Return to the emergency department for any new or worsening symptoms.

## 2020-04-30 NOTE — ED Provider Notes (Signed)
I provided a substantive portion of the care of this patient.  I personally performed the entirety of the medical decision making for this encounter.    87 year old male who presents after having a fall last night.  Work-up here including head CT negative.  Patient also had a lumbosacral series which was negative.  Discharged home   Lacretia Leigh, MD 04/30/20 1433

## 2020-04-30 NOTE — ED Notes (Signed)
Patient transported to CT 

## 2020-04-30 NOTE — ED Provider Notes (Signed)
[facility] EMERGENCY DEPARTMENT Provider Note   CSN: [id number] Arrival date & time: 04/30/20  1022     History Chief Complaint  Patient presents with  . Fall   [name] is a 87 y.o. male who presents with concern for low back pain after fall at home last night.  Patient states that he woke in the night because he was too hot. He states he stood up out of bed to turn on the heat, and when walking down the thermostat he became very dizzy lightheaded. States he fell to the ground. He states he hit his head on the dresser as he fell, however landed on the carpeted floor on his buttocks. He states that his wife and his daughter were able to help him get back up and get back into bed without issue. He states he presents to the emergency room today due to concern for low back pain.  At time of my initial evaluation the patient he complains only of his low back pain.  He denies any headache, dizziness, blurred vision, double vision, nausea, vomiting since the fall.  He denies any chest pain, palpitations, shortness of breath.  He follows closely with his primary care doctor.  He denies any numbness, tingling, weakness in his lower extremities, denies pain, tingling in his groin, denies urinary retention or incontinence since the fall.  Patient is on aspirin and Plavix.  I have personally reviewed this patient's medical records.  His history of hyperlipidemia, GERD, CKD, type 2 diabetes, hypertension, coronary artery disease, congestive heart failure, osteoporosis.  HPI     Past Medical History:  Diagnosis Date  . Arthritis    osteoarthritis, s/p R TKR, and digits  . CAD (coronary artery disease)    a. s/p CABG (2001)  b. s/p DES to RCA and cutting POBA to ostial PDA (2013)   c. s/p DES to SVG to OM2 (01/14/14) d. cath: 08/2015 NSTEMI w/ patent LIMA-LAD and 99% stenosis of SVG-OM w/ DES placed. CTO of SVG-RCA and SVG-D1.   . Cataract   . Chronic diastolic CHF  (congestive heart failure) ([place])    a) 09/13 ECHO- LVEF 67-59%, grade 1 diastolic dysfunction, mild LA dilatation, atrial septal aneurysm, AV mobility restricted, but no sig AS by doppler; b) 09/04/08 ECHO- LVH, ef 60%, mild AS, c. echo 08/2015: EF perserved of 55-60% with inferolateral HK. Mild AS noted.  . Chronic kidney disease, stage III (moderate) ([place])   . Chronic lower back pain   . Colon polyps   . COVID-19   . Diverticulosis   . Dyspnea 2009 since July -Sept   05/06/08-CPST-  normal effort, reduced VO2 max 20.5 /65%, reduced at 8.2/ 40%, normal breathing resetvca of 55%, submaximal heart rate response 112/77%, flattened o2 pluse response at peak exercise-12 ml/beat @ 85%, No VQ mismatch abnormalities, All c/w CIRC Limitation  . Enlarged prostate   . Esophageal stricture    a. s/p dilation spring 2010  . GERD (gastroesophageal reflux disease)   . Heart murmur   . Hiatal hernia   . History of carpal tunnel syndrome    Bilateral  . History of kidney stones   . History of PFTs    mixed pattern on spiro. mild restn on lung volumes with near normal DLCO. Pattern can be explained by CABG scar. Fev1 2.2L/73%, ratio 68 (67), TLC 4.7/68%,RV 1.5L/55%,DLCO 79%  . Hyperlipidemia   . Hypertension   . Interstitial lung disease ([place])  NOS  . Iron deficiency anemia   . Nausea & vomiting    2018/2019  . On home oxygen therapy    2 L [place] at bedtime  . Osteoporosis   . Overweight (BMI 25.0-29.9)    BMI 29  . Peripheral neuropathy   . RA (rheumatoid arthritis) ([name])    [name]  . Seropositive rheumatoid arthritis ([employer])   . Type II diabetes mellitus ([name])    diet controlled  . Wears glasses   . Wears partial dentures    upper    Patient Active Problem List   Diagnosis Date Noted  . Ventricular tachycardia ([place]) 04/23/2020  . Chest pain 04/10/2020  . Chronic narcotic dependence ([place]) 12/14/2019  . Callus of foot 11/08/2019  . Plantar flexed metatarsal bone of left foot 11/08/2019   . COVID-19 virus infection 04/12/2019  . Long term prescription opiate use 12/14/2018  . Neuropathy 12/14/2018  . Stage 3b chronic kidney disease ([place]) 12/14/2018  . Problems with swallowing and mastication   . Achalasia   . Right inguinal hernia 07/04/2018  . Advance directive discussed with patient 04/28/2018  . Chronic pain of both lower extremities 10/26/2017  . Neuropathic pain 10/26/2017  . Chronic pain syndrome 10/26/2017  . Iron deficiency anemia 07/05/2017  . Constipation 07/05/2017  . CKD (chronic kidney disease) stage 4, GFR 15-29 ml/min ([name]) 05/17/2017  . Seropositive rheumatoid arthritis ([place]) 05/17/2017  . DM (diabetes mellitus) type II controlled, neurological manifestation ([place]) 05/17/2017  . Atherosclerotic heart disease of native coronary artery with angina pectoris ([place]) 05/17/2017  . Dysphagia 05/17/2017  . High risk medication use 10/21/2016  . Primary osteoarthritis of both knees 10/21/2016  . History of right knee joint replacement 10/21/2016  . Orthostatic hypotension 10/01/2016  . Tegretol-induced dizziness 05/14/2016  . Spinal stenosis of lumbar region without neurogenic claudication 03/23/2016  . Lumbar facet arthropathy 03/23/2016  . Respiratory failure, chronic ([place]) 11/21/2014  . Rheumatoid arthritis ([place]) 11/05/2014  . Rheumatic fever without heart involvement 03/04/2014  . Ventral hernia 12/17/2013  . Routine general medical examination at a health care facility 08/29/2012  . Routine history and physical examination of adult 08/29/2012  . ILD (interstitial lung disease) ([place]) 11/28/2011  . Chronic diastolic heart failure ([place]) 09/14/2011  . Diabetic polyneuropathy associated with type 2 diabetes mellitus ([place]) 08/10/2010  . Obstructive sleep apnea 12/17/2008  . ESOPHAGEAL STRICTURE 10/09/2008  . Esophageal stricture 10/09/2008  . Reflux esophagitis 09/10/2008  . Diverticulosis of colon 09/10/2008  . Gastro-esophageal reflux disease with  esophagitis 09/10/2008  . Coronary atherosclerosis 03/19/2008  . Aortic valve disorder 03/19/2008  . Thoracic aorta atherosclerosis ([place]) 03/19/2008  . Actinic keratosis 10/23/2007  . SLEEP DISORDER, CHRONIC 10/17/2006  . Disturbance in sleep behavior 10/17/2006  . 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 Essential hypertension 09/23/2006  . GERD 09/23/2006  . BENIGN PROSTATIC HYPERTROPHY 09/23/2006  . Enlarged prostate without lower urinary tract symptoms (luts) 09/23/2006  . HLD (hyperlipidemia) 09/23/2006    Past Surgical History:  Procedure Laterality Date  . BALLOON DILATION N/A 09/12/2018   Procedure: BALLOON DILATION;  Surgeon: Irene Shipper, MD;  Location: [facility];  Service: Endoscopy;  Laterality: N/A;  . BALLOON DILATION N/A 02/20/2020   Procedure: BALLOON DILATION;  Surgeon: Irene Shipper, MD;  Location: [facility];  Service: Endoscopy;  Laterality: N/A;  . BOTOX INJECTION N/A 09/12/2018   Procedure: BOTOX INJECTION;  Surgeon: Irene Shipper, MD;  Location: [facility];  Service: Endoscopy;  Laterality:  N/A;  . BOTOX INJECTION N/A 11/27/2018   Procedure: BOTOX INJECTION;  Surgeon: Irene Shipper, MD;  Location: [facility];  Service: Endoscopy;  Laterality: N/A;  . BOTOX INJECTION N/A 02/20/2020   Procedure: BOTOX INJECTION;  Surgeon: Irene Shipper, MD;  Location: [facility];  Service: Endoscopy;  Laterality: N/A;  . CARDIAC CATHETERIZATION  08/2004   CP- no MI, Cath- small vessell disease   . CARDIAC CATHETERIZATION  12/31/2011   80% distal LM, 100% native LAD, LCx and RCA, 30% prox SVG-OM, SVG-D1 normal, 99% distal, 80% ostial SVG-RCA distal to graft, LIMA-LAD normal; LVEF mildly decreased with posterior basal AK   . CARDIAC CATHETERIZATION  2009   with patent grafts/notes 12/31/2011  . CARDIAC CATHETERIZATION N/A 08/13/2015   Procedure: Left Heart Cath and Cors/Grafts Angiography;  Surgeon: Sherren Mocha, MD;  Location: [facility];   Service: Cardiovascular;  Laterality: N/A;  . CATARACT EXTRACTION W/ INTRAOCULAR LENS  IMPLANT, BILATERAL Bilateral   . CHOLECYSTECTOMY OPEN  11/2003   [name]  . CORONARY ANGIOPLASTY WITH STENT PLACEMENT  01/03/2012   Successful DES to SVG-RCA and cutting balloon angioplasty ostial  PDA   . CORONARY ANGIOPLASTY WITH STENT PLACEMENT  01/14/2014   "1"  . CORONARY ARTERY BYPASS GRAFT  11/1999   CABG X5  . CORONARY STENT PLACEMENT  02/2012   1 stent and balloon  . ESOPHAGEAL DILATION  11/27/2018   Procedure: ESOPHAGEAL DILATION;  Surgeon: Irene Shipper, MD;  Location: [facility];  Service: Endoscopy;;  . ESOPHAGOGASTRODUODENOSCOPY N/A 03/01/2017   Procedure: ESOPHAGOGASTRODUODENOSCOPY (EGD);  Surgeon: Irene Shipper, MD;  Location: [facility];  Service: Endoscopy;  Laterality: N/A;  . ESOPHAGOGASTRODUODENOSCOPY (EGD) WITH ESOPHAGEAL DILATION  2010  . ESOPHAGOGASTRODUODENOSCOPY (EGD) WITH PROPOFOL N/A 09/12/2018   Procedure: ESOPHAGOGASTRODUODENOSCOPY (EGD) WITH PROPOFOL;  Surgeon: Irene Shipper, MD;  Location: [facility];  Service: Endoscopy;  Laterality: N/A;  . ESOPHAGOGASTRODUODENOSCOPY (EGD) WITH PROPOFOL N/A 11/27/2018   Procedure: ESOPHAGOGASTRODUODENOSCOPY (EGD) WITH PROPOFOL, WITH BALLOON DILATION;  Surgeon: Irene Shipper, MD;  Location: [facility];  Service: Endoscopy;  Laterality: N/A;  . ESOPHAGOGASTRODUODENOSCOPY (EGD) WITH PROPOFOL N/A 02/20/2020   Procedure: ESOPHAGOGASTRODUODENOSCOPY (EGD) WITH PROPOFOL;  Surgeon: Irene Shipper, MD;  Location: [facility];  Service: Endoscopy;  Laterality: N/A;  . HAND SURGERY     bilateral carpal tunnel releases  . JOINT REPLACEMENT    . KNEE ARTHROSCOPY Right 2008  . LEFT AND RIGHT HEART CATHETERIZATION WITH CORONARY ANGIOGRAM  12/31/2011   Procedure: LEFT AND RIGHT HEART CATHETERIZATION WITH CORONARY ANGIOGRAM;  Surgeon: Burnell Blanks, MD;  Location: [facility] CATH LAB;  Service: Cardiovascular;;  . LEFT AND RIGHT HEART CATHETERIZATION  WITH CORONARY ANGIOGRAM N/A 01/14/2014   Procedure: LEFT AND RIGHT HEART CATHETERIZATION WITH CORONARY ANGIOGRAM;  Surgeon: Peter M Martinique, MD;  Location: [facility] CATH LAB;  Service: Cardiovascular;  Laterality: N/A;  . MALONEY DILATION  03/01/2017   Procedure: Venia Minks DILATION;  Surgeon: Irene Shipper, MD;  Location: [facility];  Service: Endoscopy;;  . PERCUTANEOUS CORONARY INTERVENTION-BALLOON ONLY  01/03/2012   Procedure: PERCUTANEOUS CORONARY INTERVENTION-BALLOON ONLY;  Surgeon: Peter M Martinique, MD;  Location: [facility] CATH LAB;  Service: Cardiovascular;;  . PERCUTANEOUS CORONARY STENT INTERVENTION (PCI-S)  12/31/2011   Procedure: PERCUTANEOUS CORONARY STENT INTERVENTION (PCI-S);  Surgeon: Burnell Blanks, MD;  Location: [facility] CATH LAB;  Service: Cardiovascular;;  . PERCUTANEOUS CORONARY STENT INTERVENTION (PCI-S) N/A 01/03/2012   Procedure: PERCUTANEOUS CORONARY STENT INTERVENTION (PCI-S);  Surgeon: Peter M Martinique,  MD;  Location: [facility] CATH LAB;  Service: Cardiovascular;  Laterality: N/A;  . SHOULDER ARTHROSCOPY WITH OPEN ROTATOR CUFF REPAIR AND DISTAL CLAVICLE ACROMINECTOMY Left 02/27/2013   Procedure: LEFT SHOULDER ARTHROSCOPY WITH MINI OPEN ROTATOR CUFF REPAIR AND SUBACROMIAL DECOMPRESSION AND DISTAL CLAVICLE RESECTION;  Surgeon: [name], MD;  Location: [facility];  Service: Orthopedics;  Laterality: Left;  . TOTAL KNEE ARTHROPLASTY Right 03/2010   [name]  . TRIGGER FINGER RELEASE Left 02/27/2013   Procedure: RELEASE TRIGGER FINGER/A-1 PULLEY;  Surgeon: [name], MD;  Location: [facility];  Service: Orthopedics;  Laterality: Left;      Family History  Problem Relation Age of Onset  . COPD Mother   . Heart disease Father   . Heart attack Father   . Stomach cancer Brother   . Stroke Sister   . Alcohol abuse Sister   . Colon cancer Brother 25  . Diabetes Brother   . Rectal cancer Neg Hx     Social History   Tobacco Use  . Smoking status: Former Smoker    Packs/day: 1.00     Years: 20.00    Pack years: 20.00    Types: Cigarettes    Quit date: 04/06/1963    Years since quitting: 57.1  . Smokeless tobacco: Former Network engineer  . Vaping Use: Never used  Substance Use Topics  . Alcohol use: No    Alcohol/week: 0.0 standard drinks    Comment: 01/01/2012 "last alcohol ~ 50 yr ago"  . Drug use: No    Home Medications Prior to Admission medications   Medication Sig Start Date End Date Taking? Authorizing Provider  amLODipine (NORVASC) 5 MG tablet Take 5 mg by mouth daily.  08/06/19   [provider]  aspirin EC 81 MG tablet Take 81 mg by mouth at bedtime.    [provider]  Blood Glucose Monitoring Suppl (ONE TOUCH ULTRA 2) w/Device KIT Use to obtain blood sugar daily. Dx Code E11.40 04/12/19   Viviana Simpler I, MD  carvedilol (COREG) 6.25 MG tablet Take 6.25 mg by mouth 2 (two) times daily. 11/15/19   [provider]  clopidogrel (PLAVIX) 75 MG tablet Take 1 tablet (75 mg total) by mouth daily. 01/22/20   Burtis Junes, NP  DHA-Vitamin C-Lutein (EYE HEALTH FORMULA PO) Take 1 tablet by mouth daily.     [provider]  furosemide (LASIX) 40 MG tablet Take 1 tablet (40 mg total) by mouth every Monday, Wednesday, and Friday. 04/14/20 05/14/20  Kayleen Memos, DO  glucose blood (ONE TOUCH ULTRA TEST) test strip USE TO CHECK BLOOD SUGAR ONCE A DAY Dx Code E11.40 01/29/20   [name], MD  hydrALAZINE (APRESOLINE) 25 MG tablet Take 1 tablet (25 mg total) by mouth 2 (two) times daily. 04/13/20 05/13/20  Kayleen Memos, DO  HYDROcodone-acetaminophen (NORCO) 10-325 MG tablet Take 2 tablets by mouth 2 (two) times daily as needed for severe pain. Must last 30 days. Max 4 tablets/day Patient taking differently: Take 2 tablets by mouth in the morning and at bedtime. Must last 30 days. Max 4 tablets/day 04/12/20 05/12/20  Gillis Santa, MD  nitroGLYCERIN (NITROSTAT) 0.4 MG SL tablet DISSOLVE 1 TABLET UNDER TONGUE AS NEEDEDFOR CHEST PAIN. MAY  REPEAT 5 MINUTES APART 3 TIMES IF NEEDED. IF NO RELIEF CALL 911 Patient taking differently: Place 0.4 mg under the tongue every 5 (five) minutes as needed for chest pain. 04/10/20   [name],  MD  ondansetron (ZOFRAN) 4 MG tablet TAKE 1 TABLET BY MOUTH EVERY 8 HOURS AS NEEDED FOR NAUSEA OR VOMITING Patient taking differently: Take 4 mg by mouth every 8 (eight) hours as needed (nausea/vomiting). 08/21/19   [name], MD  OneTouch Delica Lancets 32P MISC 1 each by In Vitro route daily. Dx Code E11.49 01/29/20   [name], MD  OXYGEN Inhale into the lungs. Per pt- Uses Oxygen 2 liters at night.    [provider]  pantoprazole (PROTONIX) 40 MG tablet Take 1 tablet (40 mg total) by mouth daily. 08/06/19   [name], MD  polyethylene glycol (MIRALAX / GLYCOLAX) 17 g packet Take 51 g by mouth every other day.     [provider]  pregabalin (LYRICA) 25 MG capsule Take 1 capsule (25 mg total) by mouth at bedtime for 20 days, THEN 2 capsules (50 mg total) at bedtime. Patient taking differently: Take 1 capsule (25 mg) by mouth at bedtime. 02/12/20 05/12/20  Gillis Santa, MD  tamsulosin (FLOMAX) 0.4 MG CAPS capsule Take 0.4 mg by mouth at bedtime.  01/31/19   [provider]    Allergies    Doxazosin mesylate and Methocarbamol  Review of Systems   Review of Systems  Constitutional: Negative.   HENT: Negative.   Eyes: Negative.   Respiratory: Negative.   Cardiovascular: Negative.   Gastrointestinal: Negative.   Genitourinary: Negative.   Musculoskeletal: Positive for back pain.  Neurological: Positive for dizziness and light-headedness.  Hematological: Negative.   Psychiatric/Behavioral: Negative.    Physical Exam Updated Vital Signs BP (!) 145/100 (BP Location: Right Arm)   Pulse 86   Temp 98.4 F (36.9 C) (Oral)   Resp 18   SpO2 96%   Physical Exam Vitals and nursing note reviewed.  HENT:     Head: Normocephalic and atraumatic.      Nose: Nose normal.     Mouth/Throat:     Mouth: Mucous membranes are dry.     Palate: No mass.     Pharynx: Oropharynx is clear. Uvula midline. No oropharyngeal exudate or posterior oropharyngeal erythema.     Tonsils: No tonsillar exudate.  Eyes:     General: Lids are normal. Vision grossly intact.        Right eye: No discharge.        Left eye: No discharge.     Extraocular Movements: Extraocular movements intact.     Conjunctiva/sclera: Conjunctivae normal.     Pupils: Pupils are equal, round, and reactive to light.  Neck:     Trachea: Trachea and phonation normal.  Cardiovascular:     Rate and Rhythm: Normal rate and regular rhythm.     Pulses: Normal pulses.          Radial pulses are 2+ on the right side and 2+ on the left side.       Dorsalis pedis pulses are 2+ on the right side and 2+ on the left side.     Heart sounds: Normal heart sounds. No murmur heard.   Pulmonary:     Effort: Pulmonary effort is normal. No respiratory distress.     Breath sounds: Normal breath sounds. No wheezing or rales.  Chest:     Chest wall: No deformity, swelling, tenderness, crepitus or edema.  Abdominal:     General: Bowel sounds are normal. There is no distension.     Palpations: Abdomen is soft.     Tenderness: There is no  abdominal tenderness. There is no guarding or rebound.  Musculoskeletal:        General: No deformity.     Right shoulder: Normal.     Left shoulder: Normal.     Right upper arm: Normal.     Left upper arm: Normal.     Right elbow: Normal.     Left elbow: Normal.     Right forearm: Normal.     Left forearm: Normal.     Right wrist: Normal.     Left wrist: Normal.     Right hand: Normal.     Left hand: Normal.     Cervical back: Normal, full passive range of motion without pain, normal range of motion and neck supple. No rigidity, spasms, tenderness, bony tenderness or crepitus. No pain with movement, spinous process tenderness or muscular tenderness.      Thoracic back: Normal. No spasms, tenderness or bony tenderness.     Lumbar back: Tenderness and bony tenderness present. No swelling, edema, deformity or spasms. Negative right straight leg raise test and negative left straight leg raise test.       Back:     Right hip: Normal.     Left hip: Normal.     Right upper leg: Normal.     Left upper leg: Normal.     Right knee: Normal.     Left knee: Normal.     Right lower leg: Normal. No edema.     Left lower leg: Normal. No edema.     Right ankle: Normal.     Right Achilles Tendon: Normal.     Left ankle: Normal.     Left Achilles Tendon: Normal.     Right foot: Normal.     Left foot: Normal.  Lymphadenopathy:     Cervical: No cervical adenopathy.  Skin:    General: Skin is warm and dry.     Capillary Refill: Capillary refill takes less than 2 seconds.  Neurological:     General: No focal deficit present.     Mental Status: He is alert and oriented to person, place, and time. Mental status is at baseline.     Cranial Nerves: Cranial nerves are intact.     Sensory: Sensation is intact.     Motor: Motor function is intact.     Gait: Gait is intact.  Psychiatric:        Mood and Affect: Mood normal.    ED Results / Procedures / Treatments   Labs (all labs ordered are listed, but only abnormal results are displayed) Labs Reviewed  BASIC METABOLIC PANEL - Abnormal; Notable for the following components:      Result Value   Glucose, Bld 252 (*)    BUN 38 (*)    Creatinine, Ser 2.11 (*)    GFR, Estimated 30 (*)    All other components within normal limits  URINALYSIS, ROUTINE W REFLEX MICROSCOPIC - Abnormal; Notable for the following components:   Glucose, UA >=500 (*)    Hgb urine dipstick SMALL (*)    Protein, ur 30 (*)    All other components within normal limits  CBG MONITORING, ED - Abnormal; Notable for the following components:   Glucose-Capillary 253 (*)    All other components within normal limits  CBC     EKG EKG Interpretation  Date/Time:  Wednesday April 30 2020 14:44:56 EST Ventricular Rate:  69 PR Interval:  146 QRS Duration: 104 QT Interval:  404 QTC Calculation:  432 R Axis:   -3 Text Interpretation: Normal sinus rhythm Normal ECG Confirmed by Dewaine Conger [phone]) on 04/30/2020 3:17:13 PM    Radiology DG Lumbar Spine Complete  Result Date: 04/30/2020 CLINICAL DATA:  Reason for exam: fall Patient reports fall today with bilateral lower back pain approximately at L3. Per ed triage notes: "Patient BIB [facility] after a fall last night. Patient was walking when he got dizzy and fell. Was assisted .*comment was truncated*fall EXAM: LUMBAR SPINE - COMPLETE 4+ VIEW COMPARISON:  None. FINDINGS: There is no evidence of lumbar spine fracture. Alignment is normal. Intervertebral disc spaces are maintained. Degenerative osteophytosis of the spine. There is mild loss of vertebral body height at T11 anteriorly measuring approximately 15% Atherosclerotic calcification of the aorta. IMPRESSION: 1. No acute findings lumbar spine. 2. Mild loss of vertebral body height anteriorly at T11. Age indeterminate compression fracture 3. Moderate disc osteophytic disease. 4.  Atherosclerotic calcification of the aorta. Electronically Signed   By: Suzy Bouchard M.D.   On: 04/30/2020 12:29   CT Head Wo Contrast  Result Date: 04/30/2020 CLINICAL DATA:  Fall last night, dizzy, head trauma EXAM: CT HEAD WITHOUT CONTRAST TECHNIQUE: Contiguous axial images were obtained from the base of the skull through the vertex without intravenous contrast. COMPARISON:  None. FINDINGS: Brain: Brain atrophy noted with minor white matter microvascular changes about the lateral ventricles. No acute intracranial hemorrhage, new infarction, midline shift, herniation, mass lesion or extra-axial fluid collection. No focal mass effect or edema. Cisterns are patent. Cerebellar atrophy as well. Vascular: No hyperdense vessel or unexpected  calcification. Skull: Normal. Negative for fracture or focal lesion. Sinuses/Orbits: No acute finding. Other: None. IMPRESSION: Age related brain atrophy and white matter microvascular changes. No acute intracranial abnormality by noncontrast CT. Electronically Signed   By: Jerilynn Mages.  Shick M.D.   On: 04/30/2020 13:20   DG Hip Unilat W or Wo Pelvis 2-3 Views Right  Result Date: 04/30/2020 CLINICAL DATA:  Fall. EXAM: DG HIP (WITH OR WITHOUT PELVIS) 2-3V RIGHT COMPARISON:  MRI lumbar spine 03/12/2016. FINDINGS: Diffuse osteopenia. Degenerative changes lumbar spine and both hips. No acute bony abnormality. No evidence of fracture or dislocation. Prominent right inguinal hernia. No bowel distention. Pelvic calcifications consistent phleboliths. Aortoiliac atherosclerotic vascular calcification. IMPRESSION: 1. Degenerative changes lumbar spine and both hips. No acute bony abnormality. 2. Prominent right inguinal hernia. 3. Aortoiliac atherosclerotic vascular disease. Electronically Signed   By: [name]  Register   On: 04/30/2020 12:58    Procedures Procedures   Medications Ordered in ED Medications  HYDROcodone-acetaminophen (NORCO/VICODIN) 5-325 MG per tablet 2 tablet (2 tablets Oral Given 04/30/20 1323)    ED Course  I have reviewed the triage vital signs and the nursing notes.  Pertinent labs & imaging results that were available during my care of the patient were reviewed by me and considered in my medical decision making (see chart for details).    MDM Rules/Calculators/A&P                          87 year old male presents after fall at home last night with concern for low back pain.  Patient on aspirin and Plavix, with history of head trauma during fall last night.  Patient hypertensive on intake to 175/85.  Vital signs otherwise normal.  Initial physical exam is very reassuring.  Patient is neurologically intact.  Cardiopulmonary exam is unremarkable.  Patient with tenderness to palpation of the  lumbar spine paraspinous musculature  bilaterally.  Analgesia offered. Will proceed with basic laboratory studies, EKG, CT head, plain films of the lumbar spine and hips.  CBC unremarkable.  Elevated creatinine on CMP, however at patient baseline.  EKG with NSR. CT head negative for acute intracranial abnormality. Plain film of the hips with pelvis negative for acute fracture or dislocation.  Plain film of the lumbar spine negative for acute fracture or dislocation. Orthostatic vital signs revealed orthostasis with transition from sitting to standing with drop in SBP from 174 to 103. Patient was able to stand but was unable to ambulate independently. He can ambulate independently without difficulty at baseline.  We will proceed with IV fluid bolus and reevaluation of vital signs.  Care of this patient signed out to coming ED provider, Benedetto Goad, PA-C at time of shift change.  All pertinent HPI, physical exam, and laboratory/imaging studies were reviewed with her prior to my departure.  I appreciate her collaboration of care this patient.  Patient may likely be discharged home following ministration of IV fluids given otherwise reassuring work-up.  This chart was dictated using voice recognition software, Dragon. Despite the best efforts of this provider to proofread and correct errors, errors may still occur which can change documentation meaning.  Final Clinical Impression(s) / ED Diagnoses Final diagnoses:  None    Rx / DC Orders ED Discharge Orders    None       Aura Dials 04/30/20 1548    Lacretia Leigh, MD 05/02/20 [phone]

## 2020-04-30 NOTE — ED Provider Notes (Signed)
Care assumed from [facility], please see her note for full details, but in brief [name] is a 87 y.o. male who presents after he had a fall last night when getting up quickly to change the thermostat, he hit his head on the dresser and fell down onto his bottom and was having some hip and low back pain.  Prior care team report he did not have focal point midline tenderness and had more diffuse tenderness across the lumbar spine.  Labs, urinalysis, EKG, head CT and x-rays of the lumbar spine and hip ordered.  Lab work has been reassuring.  EKG without concerning changes.  Head CT is clear. Hip x-rays with some degenerative changes but no acute bony abnormality.  No acute findings within the L-spine, there is some mild height loss at T11 but patient does not have point tenderness in this area.  Plan was for discharge, but with standing patient had positive orthostatic vitals, started to feel woozy and was unable to ambulate, will give 1 L of fluids and then reevaluate, if this is improved patient can be discharged home.   Physical Exam  BP (!) 161/67 (BP Location: Right Arm)   Pulse 85   Temp 98.4 F (36.9 C) (Oral)   Resp 18   SpO2 94%   Physical Exam Vitals and nursing note reviewed.  Constitutional:      General: He is not in acute distress.    Appearance: He is well-developed and well-nourished. He is not diaphoretic.     Comments: Very pleasant elderly gentleman, alert and in no acute distress  HENT:     Head: Normocephalic and atraumatic.  Eyes:     General:        Right eye: No discharge.        Left eye: No discharge.  Pulmonary:     Effort: Pulmonary effort is normal. No respiratory distress.  Skin:    General: Skin is warm and dry.  Neurological:     Mental Status: He is alert and oriented to person, place, and time.     Coordination: Coordination normal.     Comments: Speech is clear, able to follow commands Moves extremities without ataxia, coordination  intact Ambulatory in the hallway with steady gait, no assistance required  Psychiatric:        Mood and Affect: Mood and affect normal.        Behavior: Behavior normal.     ED Course/Procedures   Labs Reviewed  BASIC METABOLIC PANEL - Abnormal; Notable for the following components:      Result Value   Glucose, Bld 252 (*)    BUN 38 (*)    Creatinine, Ser 2.11 (*)    GFR, Estimated 30 (*)    All other components within normal limits  URINALYSIS, ROUTINE W REFLEX MICROSCOPIC - Abnormal; Notable for the following components:   Glucose, UA >=500 (*)    Hgb urine dipstick SMALL (*)    Protein, ur 30 (*)    All other components within normal limits  CBG MONITORING, ED - Abnormal; Notable for the following components:   Glucose-Capillary 253 (*)    All other components within normal limits  CBC   DG Lumbar Spine Complete  Result Date: 04/30/2020 CLINICAL DATA:  Reason for exam: fall Patient reports fall today with bilateral lower back pain approximately at L3. Per ed triage notes: "Patient BIB [facility] after a fall last night. Patient was walking when he got dizzy  and fell. Was assisted .*comment was truncated*fall EXAM: LUMBAR SPINE - COMPLETE 4+ VIEW COMPARISON:  None. FINDINGS: There is no evidence of lumbar spine fracture. Alignment is normal. Intervertebral disc spaces are maintained. Degenerative osteophytosis of the spine. There is mild loss of vertebral body height at T11 anteriorly measuring approximately 15% Atherosclerotic calcification of the aorta. IMPRESSION: 1. No acute findings lumbar spine. 2. Mild loss of vertebral body height anteriorly at T11. Age indeterminate compression fracture 3. Moderate disc osteophytic disease. 4.  Atherosclerotic calcification of the aorta. Electronically Signed   By: Suzy Bouchard M.D.   On: 04/30/2020 12:29   CT Head Wo Contrast  Result Date: 04/30/2020 CLINICAL DATA:  Fall last night, dizzy, head trauma EXAM: CT HEAD WITHOUT CONTRAST  TECHNIQUE: Contiguous axial images were obtained from the base of the skull through the vertex without intravenous contrast. COMPARISON:  None. FINDINGS: Brain: Brain atrophy noted with minor white matter microvascular changes about the lateral ventricles. No acute intracranial hemorrhage, new infarction, midline shift, herniation, mass lesion or extra-axial fluid collection. No focal mass effect or edema. Cisterns are patent. Cerebellar atrophy as well. Vascular: No hyperdense vessel or unexpected calcification. Skull: Normal. Negative for fracture or focal lesion. Sinuses/Orbits: No acute finding. Other: None. IMPRESSION: Age related brain atrophy and white matter microvascular changes. No acute intracranial abnormality by noncontrast CT. Electronically Signed   By: Jerilynn Mages.  Shick M.D.   On: 04/30/2020 13:20   DG Hip Unilat W or Wo Pelvis 2-3 Views Right  Result Date: 04/30/2020 CLINICAL DATA:  Fall. EXAM: DG HIP (WITH OR WITHOUT PELVIS) 2-3V RIGHT COMPARISON:  MRI lumbar spine 03/12/2016. FINDINGS: Diffuse osteopenia. Degenerative changes lumbar spine and both hips. No acute bony abnormality. No evidence of fracture or dislocation. Prominent right inguinal hernia. No bowel distention. Pelvic calcifications consistent phleboliths. Aortoiliac atherosclerotic vascular calcification. IMPRESSION: 1. Degenerative changes lumbar spine and both hips. No acute bony abnormality. 2. Prominent right inguinal hernia. 3. Aortoiliac atherosclerotic vascular disease. Electronically Signed   By: [name]  Register   On: 04/30/2020 12:58    Procedures  MDM   On reevaluation after IV fluids patient is no longer orthostatic and was able to ambulate to the bathroom without difficulty.  He is feeling much better, denies any wooziness, or lightheadedness when walking.  At this time he is stable for discharge home.  Encouraged him to increase p.o. fluid intake and to stand up slowly when getting up to walk around the house.  PCP  follow-up encouraged and return precautions discussed.  Discharged home in good condition.      Jacqlyn Larsen, PA-C 04/30/20 1856    Breck Coons, MD 04/30/20 2252

## 2020-04-30 NOTE — ED Triage Notes (Signed)
Patient BIB [facility] after a fall last night. Patient was walking when he got dizzy and fell. Was assisted back to bed by family, now complains of lower back pain.

## 2020-04-30 NOTE — ED Notes (Signed)
Pt ambulatory 30 ft w/ no issue. Merleen Nicely PA notified.

## 2020-05-01 ENCOUNTER — Ambulatory Visit: Payer: Medicare Other | Admitting: Podiatry

## 2020-05-05 ENCOUNTER — Telehealth: Payer: Self-pay

## 2020-05-05 NOTE — Telephone Encounter (Signed)
Michela Pitcher he is not in as much pain as he was in. Thanks [place] for the call.

## 2020-05-08 ENCOUNTER — Telehealth: Payer: Self-pay

## 2020-05-08 NOTE — Telephone Encounter (Signed)
He has fallen and he wants to know if he can switch his medicine to every 4 hours instead of 3 times a day. Please call him and let him know

## 2020-05-08 NOTE — Telephone Encounter (Signed)
Spoke with patient re; his phone message about falling and wanting additional pain medicine.  States he fell a little over 1 week ago on Tuesday, went to the hospital and was assessed.  They found no injury and he was released.  I told him that we would not up his pain medication for an acute situation.  He takes hydrocodone - apap 10-325 2 tablets 2 times daily, not to exceed 4 tablets per day.  I did tell them that if they felt he needed pain medication at the ED they could prescribe him this for the acute pain.  He states they did not give him anything.  I told him he could add additional 500 mg of tylenol to his regimen if he felt he needed it to help with the pain.  Patient verbalizes u/o information.

## 2020-05-12 ENCOUNTER — Ambulatory Visit: Payer: Medicare Other | Admitting: Rheumatology

## 2020-05-12 DIAGNOSIS — Z79899 Other long term (current) drug therapy: Secondary | ICD-10-CM

## 2020-05-12 DIAGNOSIS — Z8679 Personal history of other diseases of the circulatory system: Secondary | ICD-10-CM

## 2020-05-12 DIAGNOSIS — J849 Interstitial pulmonary disease, unspecified: Secondary | ICD-10-CM

## 2020-05-12 DIAGNOSIS — M1712 Unilateral primary osteoarthritis, left knee: Secondary | ICD-10-CM

## 2020-05-12 DIAGNOSIS — Z96651 Presence of right artificial knee joint: Secondary | ICD-10-CM

## 2020-05-12 DIAGNOSIS — N184 Chronic kidney disease, stage 4 (severe): Secondary | ICD-10-CM

## 2020-05-12 DIAGNOSIS — M47816 Spondylosis without myelopathy or radiculopathy, lumbar region: Secondary | ICD-10-CM

## 2020-05-12 DIAGNOSIS — Z8639 Personal history of other endocrine, nutritional and metabolic disease: Secondary | ICD-10-CM

## 2020-05-12 DIAGNOSIS — M059 Rheumatoid arthritis with rheumatoid factor, unspecified: Secondary | ICD-10-CM

## 2020-05-12 DIAGNOSIS — I5032 Chronic diastolic (congestive) heart failure: Secondary | ICD-10-CM

## 2020-05-12 DIAGNOSIS — Z8719 Personal history of other diseases of the digestive system: Secondary | ICD-10-CM

## 2020-05-12 DIAGNOSIS — Z8669 Personal history of other diseases of the nervous system and sense organs: Secondary | ICD-10-CM

## 2020-05-12 DIAGNOSIS — R202 Paresthesia of skin: Secondary | ICD-10-CM

## 2020-05-13 ENCOUNTER — Encounter: Payer: Self-pay | Admitting: Student in an Organized Health Care Education/Training Program

## 2020-05-13 ENCOUNTER — Other Ambulatory Visit: Payer: Self-pay

## 2020-05-13 ENCOUNTER — Ambulatory Visit
Payer: Medicare Other | Attending: Student in an Organized Health Care Education/Training Program | Admitting: Student in an Organized Health Care Education/Training Program

## 2020-05-13 ENCOUNTER — Encounter: Payer: Medicare Other | Admitting: Student in an Organized Health Care Education/Training Program

## 2020-05-13 DIAGNOSIS — M47816 Spondylosis without myelopathy or radiculopathy, lumbar region: Secondary | ICD-10-CM

## 2020-05-13 DIAGNOSIS — M792 Neuralgia and neuritis, unspecified: Secondary | ICD-10-CM

## 2020-05-13 DIAGNOSIS — E1142 Type 2 diabetes mellitus with diabetic polyneuropathy: Secondary | ICD-10-CM

## 2020-05-13 DIAGNOSIS — G894 Chronic pain syndrome: Secondary | ICD-10-CM

## 2020-05-13 NOTE — Progress Notes (Signed)
Patient: [name]  Service Category: E/M  Provider: Gillis Santa, MD  DOB: Feb 19, 1932  DOS: 05/13/2020  Location: Office  MRN: [id number]  Setting: Ambulatory outpatient  Referring Provider: [name], MD  Type: Established Patient  Specialty: Interventional Pain Management  PCP: [name], MD  Location: Home  Delivery: TeleHealth     Virtual Encounter - Pain Management PROVIDER NOTE: Information contained herein reflects review and annotations entered in association with encounter. Interpretation of such information and data should be left to medically-trained personnel. Information provided to patient can be located elsewhere in the medical record under "Patient Instructions". Document created using STT-dictation technology, any transcriptional errors that may result from process are unintentional.    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 [place] [address] Phone: [phone] Fax: [phone]   Pre-screening  [name] offered "in-person" vs "virtual" encounter. He indicated preferring virtual for this encounter.   Reason COVID-19*  Social distancing based on CDC and AMA recommendations.   I contacted [name] on 05/13/2020 via telephone.      I clearly identified myself as Gillis Santa, MD. I verified that I was speaking with the correct person using two identifiers (Name: [name], and date of birth: [birth year]).  Consent I sought verbal advanced consent from [name] for virtual visit interactions. I informed [name] of possible security and privacy concerns, risks, and limitations associated with providing  "not-in-person" medical evaluation and management services. I also informed [name] of the availability of "in-person" appointments. Finally, I informed him that there would be a charge for the virtual visit and that he could be  personally, fully or partially, financially responsible for it. [name] expressed understanding and agreed to proceed.   Historic Elements   [name] is a 87 y.o. year old, male patient evaluated today after our last contact on 04/10/2020. [name]  has a past medical history of Arthritis, CAD (coronary artery disease), Cataract, Chronic diastolic CHF (congestive heart failure) ([place]), Chronic kidney disease, stage III (moderate) ([place]), Chronic lower back pain, Colon polyps, COVID-19, Diverticulosis, Dyspnea (2009 since July -Sept), Enlarged prostate, Esophageal stricture, GERD (gastroesophageal reflux disease), Heart murmur, Hiatal hernia, History of carpal tunnel syndrome, History of kidney stones, History of PFTs, Hyperlipidemia, Hypertension, Interstitial lung disease ([place]), Iron deficiency anemia, Nausea & vomiting, On home oxygen therapy, Osteoporosis, Overweight (BMI 25.0-29.9), Peripheral neuropathy, RA (rheumatoid arthritis) ([place]), Seropositive rheumatoid arthritis ([place]), Type II diabetes mellitus ([place]), Wears glasses, and Wears partial dentures. He also  has a past surgical history that includes Total knee arthroplasty (Right, 03/2010); Knee arthroscopy (Right, 2008); Cataract extraction w/ intraocular lens  implant, bilateral (Bilateral); Coronary artery bypass graft (11/1999); Coronary stent placement (02/2012); Shoulder arthroscopy with open rotator cuff repair and distal clavicle acrominectomy (Left, 02/27/2013); Trigger finger release (Left, 02/27/2013); Cholecystectomy open (11/2003); Joint replacement; Esophagogastroduodenoscopy (egd) with esophageal dilation (2010); Cardiac catheterization (08/2004); Cardiac catheterization (12/31/2011); Cardiac  catheterization (2009); left and right heart catheterization with coronary angiogram (12/31/2011); percutaneous coronary stent intervention (pci-s) (12/31/2011); percutaneous coronary stent intervention (pci-s) (N/A, 01/03/2012); Percutaneous coronary intervention-balloon only (01/03/2012); left and right heart catheterization with coronary angiogram (N/A, 01/14/2014); Hand surgery; Cardiac catheterization (N/A, 08/13/2015); Esophagogastroduodenoscopy (N/A,  03/01/2017); maloney dilation (03/01/2017); Esophagogastroduodenoscopy (egd) with propofol (N/A, 09/12/2018); Botox injection (N/A, 09/12/2018); Balloon dilation (N/A, 09/12/2018); Coronary angioplasty with stent (01/03/2012); Coronary angioplasty with stent (01/14/2014); Esophagogastroduodenoscopy (egd) with propofol (N/A, 11/27/2018); Botox injection (N/A, 11/27/2018); Esophageal dilation (11/27/2018); Esophagogastroduodenoscopy (egd) with propofol (N/A, 02/20/2020); Botox injection (N/A, 02/20/2020); and Balloon dilation (N/A, 02/20/2020). [name] has a current medication list which includes the following prescription(s): amlodipine, aspirin ec, one touch ultra 2, carvedilol, clopidogrel, dha-vitamin c-lutein, furosemide, glucose blood, hydralazine, nitroglycerin, ondansetron, onetouch delica lancets 33g, oxygen-helium, pantoprazole, polyethylene glycol, pregabalin, and tamsulosin. He  reports that he quit smoking about 57 years ago. His smoking use included cigarettes. He has a 20.00 pack-year smoking history. He has quit using smokeless tobacco. He reports that he does not drink alcohol and does not use drugs. [name] is allergic to doxazosin mesylate and methocarbamol.   HPI  Today, he is being contacted for worsening of previously known (established) problem   Virtual visit to discuss how [name] is doing given acute pain from his fall that he sustained on 04/30/2020.  He states that he stood up to change the temperature on his thermostat and hit his  head on the dresser and was having increased low back and hip pain.  At the emergency department they did a CT of his head, x-rays of lumbar spine and hip which were unremarkable.  No acute findings and his head CT was clear.  He was given a liter of fluid at the time.  He states that his pain is improving from the fall.  He continues to deal with chronic low back pain.  Unfortunately his diagnostic right L3, L4, L5 facet medial branch nerve block increased his blood sugars fairly significantly.  We can consider doing a radiofrequency ablation in the future however I would like to see how [name] recovers from his fall first.  We will avoid steroid medications if we pursue a RFA of his right L3, L4, L5 medial branch nerves.  I informed the patient that he still has a prescription of hydrocodone that he can pick up from his pharmacy.  I will see [name] back in approximately 1 month for medication management and also to discuss radiofrequency ablation once we see how his acute low back and hip pain has resolved.  Patient endorsed understanding.  Pharmacotherapy Assessment  Analgesic: Hydrocodone 10 mg every 4-6 hours as needed, quantity 150/month; MME equals 50    Monitoring: [place] PMP: PDMP reviewed during this encounter.       Pharmacotherapy: No side-effects or adverse reactions reported. Compliance: No problems identified. Effectiveness: Clinically acceptable. Plan: Refer to "POC".  UDS:  Summary  Date Value Ref Range Status  09/18/2019 Note  Final    Comment:    ==================================================================== [employer] Select 13 (MW) ==================================================================== Test                             Result       Flag       Units  Drug Present and Declared for Prescription Verification   Hydrocodone                    1687         EXPECTED   ng/mg creat   Hydromorphone                  2436         EXPECTED  ng/mg creat    Dihydrocodeine                 436          EXPECTED   ng/mg creat   Norhydrocodone                 1384         EXPECTED   ng/mg creat    Sources of hydrocodone include scheduled prescription medications.    Hydromorphone, dihydrocodeine and norhydrocodone are expected    metabolites of hydrocodone. Hydromorphone and dihydrocodeine are    also available as scheduled prescription medications.  ==================================================================== Test                      Result    Flag   Units      Ref Range   Creatinine              45               mg/dL      >=20 ==================================================================== Declared Medications:  The flagging and interpretation on this report are based on the  following declared medications.  Unexpected results may arise from  inaccuracies in the declared medications.   **Note: The testing scope of this panel includes these medications:   Hydrocodone ([place])  Hydrocodone   **Note: The testing scope of this panel does not include the  following reported medications:   Acetaminophen (Norco)  Amlodipine (Norvasc)  Aspirin  Carvedilol (Coreg)  Clopidogrel (Plavix)  Furosemide (Lasix)  Helium  Losartan (Cozaar)  Nitroglycerin (Nitrostat)  Ondansetron (Zofran)  Oxygen  Pantoprazole (Protonix)  Polyethylene Glycol  Tamsulosin (Flomax) ==================================================================== For clinical consultation, please call [phone]. ====================================================================     Laboratory Chemistry Profile   Renal Lab Results  Component Value Date   BUN 38 (H) 04/30/2020   CREATININE 2.11 (H) 04/30/2020   BCR 13 06/24/2017   GFR 30.78 (L) 06/13/2019   GFRAA 30 (L) 06/24/2017   GFRNONAA 30 (L) 04/30/2020     Hepatic Lab Results  Component Value Date   AST 14 06/13/2019   ALT 9 06/13/2019   ALBUMIN 4.1 06/13/2019   ALBUMIN 4.1 06/13/2019    ALKPHOS 58 06/13/2019     Electrolytes Lab Results  Component Value Date   NA 139 04/30/2020   K 4.0 04/30/2020   CL 99 04/30/2020   CALCIUM 9.9 04/30/2020   MG 2.1 04/12/2020   PHOS 2.9 06/13/2019     Bone No results found for: VD25OH, VD125OH2TOT, AG5364WO0, HO1224MG5, 25OHVITD1, 25OHVITD2, 25OHVITD3, TESTOFREE, TESTOSTERONE   Inflammation (CRP: Acute Phase) (ESR: Chronic Phase) Lab Results  Component Value Date   ESRSEDRATE 38 (H) 11/22/2011       Note: Above Lab results reviewed.  Imaging  CT Head Wo Contrast CLINICAL DATA:  Fall last night, dizzy, head trauma  EXAM: CT HEAD WITHOUT CONTRAST  TECHNIQUE: Contiguous axial images were obtained from the base of the skull through the vertex without intravenous contrast.  COMPARISON:  None.  FINDINGS: Brain: Brain atrophy noted with minor white matter microvascular changes about the lateral ventricles. No acute intracranial hemorrhage, new infarction, midline shift, herniation, mass lesion or extra-axial fluid collection. No focal mass effect or edema. Cisterns are patent. Cerebellar atrophy as well.  Vascular: No hyperdense vessel or unexpected calcification.  Skull: Normal. Negative for fracture or focal lesion.  Sinuses/Orbits: No acute finding.  Other: None.  IMPRESSION: Age related brain atrophy and  white matter microvascular changes.  No acute intracranial abnormality by noncontrast CT.  Electronically Signed   By: Jerilynn Mages.  Shick M.D.   On: 04/30/2020 13:20 DG Hip Unilat W or Wo Pelvis 2-3 Views Right CLINICAL DATA:  Fall.  EXAM: DG HIP (WITH OR WITHOUT PELVIS) 2-3V RIGHT  COMPARISON:  MRI lumbar spine 03/12/2016.  FINDINGS: Diffuse osteopenia. Degenerative changes lumbar spine and both hips. No acute bony abnormality. No evidence of fracture or dislocation. Prominent right inguinal hernia. No bowel distention. Pelvic calcifications consistent phleboliths. Aortoiliac atherosclerotic vascular  calcification.  IMPRESSION: 1. Degenerative changes lumbar spine and both hips. No acute bony abnormality. 2. Prominent right inguinal hernia. 3. Aortoiliac atherosclerotic vascular disease.  Electronically Signed   By: [name]  Register   On: 04/30/2020 12:58 DG Lumbar Spine Complete CLINICAL DATA:  Reason for exam: fall Patient reports fall today with bilateral lower back pain approximately at L3. Per ed triage notes: "Patient BIB [facility] after a fall last night. Patient was walking when he got dizzy and fell. Was assisted .*comment was truncated*fall  EXAM: LUMBAR SPINE - COMPLETE 4+ VIEW  COMPARISON:  None.  FINDINGS: There is no evidence of lumbar spine fracture. Alignment is normal. Intervertebral disc spaces are maintained. Degenerative osteophytosis of the spine.  There is mild loss of vertebral body height at T11 anteriorly measuring approximately 15%  Atherosclerotic calcification of the aorta.  IMPRESSION: 1. No acute findings lumbar spine. 2. Mild loss of vertebral body height anteriorly at T11. Age indeterminate compression fracture 3. Moderate disc osteophytic disease. 4.  Atherosclerotic calcification of the aorta.  Electronically Signed   By: Suzy Bouchard M.D.   On: 04/30/2020 12:29  Assessment  The primary encounter diagnosis was Lumbar facet arthropathy. Diagnoses of Lumbar spondylosis, Neuropathic pain, Diabetic polyneuropathy associated with type 2 diabetes mellitus ([place]), and Chronic pain syndrome were also pertinent to this visit.  Plan of Care   Follow-up in 1 month for medication management.  Consider radiofrequency ablation of the right L3, L4, L5 medial branch nerves in the future.  Avoid steroid therapy.  Patient has had 2+ diagnostic lumbar facet medial branch nerve blocks in the past.  Follow-up plan:   Return in about 4 weeks (around 06/10/2020) for Medication Management, in person.   Recent Visits Date Type Provider Dept  04/09/20  Procedure visit Gillis Santa, MD [facility]  Showing recent visits within past 90 days and meeting all other requirements Today's Visits Date Type Provider Dept  05/13/20 Telemedicine Gillis Santa, MD [facility]  Showing today's visits and meeting all other requirements Future Appointments No visits were found meeting these conditions. Showing future appointments within next 90 days and meeting all other requirements  I discussed the assessment and treatment plan with the patient. The patient was provided an opportunity to ask questions and all were answered. The patient agreed with the plan and demonstrated an understanding of the instructions.  Patient advised to call back or seek an in-person evaluation if the symptoms or condition worsens.  Duration of encounter: 15 minutes.  Note by: Gillis Santa, MD Date: 05/13/2020; Time: 2:47 PM

## 2020-05-18 DIAGNOSIS — J8410 Pulmonary fibrosis, unspecified: Secondary | ICD-10-CM | POA: Diagnosis not present

## 2020-05-18 DIAGNOSIS — R0689 Other abnormalities of breathing: Secondary | ICD-10-CM | POA: Diagnosis not present

## 2020-05-22 ENCOUNTER — Encounter: Payer: Medicare Other | Admitting: Student in an Organized Health Care Education/Training Program

## 2020-05-22 ENCOUNTER — Encounter: Payer: Self-pay | Admitting: Podiatry

## 2020-05-22 ENCOUNTER — Other Ambulatory Visit: Payer: Self-pay

## 2020-05-22 ENCOUNTER — Ambulatory Visit: Payer: Medicare Other | Admitting: Podiatry

## 2020-05-22 DIAGNOSIS — N184 Chronic kidney disease, stage 4 (severe): Secondary | ICD-10-CM | POA: Diagnosis not present

## 2020-05-22 DIAGNOSIS — M79674 Pain in right toe(s): Secondary | ICD-10-CM | POA: Diagnosis not present

## 2020-05-22 DIAGNOSIS — E1142 Type 2 diabetes mellitus with diabetic polyneuropathy: Secondary | ICD-10-CM | POA: Diagnosis not present

## 2020-05-22 DIAGNOSIS — L84 Corns and callosities: Secondary | ICD-10-CM | POA: Diagnosis not present

## 2020-05-22 DIAGNOSIS — M216X2 Other acquired deformities of left foot: Secondary | ICD-10-CM

## 2020-05-22 DIAGNOSIS — M79675 Pain in left toe(s): Secondary | ICD-10-CM | POA: Diagnosis not present

## 2020-05-22 DIAGNOSIS — B351 Tinea unguium: Secondary | ICD-10-CM | POA: Diagnosis not present

## 2020-05-22 NOTE — Progress Notes (Signed)
This patient returns to my office for at risk foot care.  This patient requires this care by a professional since this patient will be at risk due to having neuropathy, CKD and diabetes.  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     This patient has a painful callus on the outside ball of his left foot.  No self treatment or professional care was noted. Patient also requests nail care today. This patient presents for at risk foot care today.  General Appearance  Alert, conversant and in no acute stress.  Vascular  Dorsalis pedis and posterior tibial  pulses are weakly  palpable  bilaterally.  Capillary return is within normal limits  bilaterally. Cold feet bilaterally. Absent digital hair  B/L.  Neurologic  Senn-Weinstein monofilament wire test absent  bilaterally. Muscle power within normal limits bilaterally.  Nails Thick disfigured discolored nails with subungual debris  from hallux to fifth toes bilaterally. No evidence of bacterial infection or drainage bilaterally.  Orthopedic  No limitations of motion  feet .  No crepitus or effusions noted.  No bony pathology or digital deformities noted. Plantar flexed fifth metatarsal left foot.  Skin  normotropic skin with no porokeratosis noted bilaterally.  No signs of infections or ulcers noted.   Callus sub 5th metatarsal left foot.  Callus due to plantarflexed fifth metatarsal left foot.  Onychomycosis  Consent was obtained for treatment procedures.   Debridement of callus with # 15 blade.  Debride nails with nail nipper followed by dremel usage.   Return office visit    10 weeks                Told patient to return for periodic foot care and evaluation due to potential at risk complications.   Gardiner Barefoot DPM

## 2020-06-04 ENCOUNTER — Other Ambulatory Visit: Payer: Self-pay | Admitting: Internal Medicine

## 2020-06-04 NOTE — Progress Notes (Deleted)
Office Visit Note  Patient: [name]             Date of Birth: 1932/03/15           MRN: [id number]             PCP: [name], MD Referring: [name], MD Visit Date: 06/05/2020 Occupation: @GUAROCC @  Subjective:  No chief complaint on file.   History of Present Illness: [name] is a 87 y.o. male ***   Activities of Daily Living:  Patient reports morning stiffness for *** {minute/hour:19697}.   Patient {ACTIONS;DENIES/REPORTS:21021675::"Denies"} nocturnal pain.  Difficulty dressing/grooming: {ACTIONS;DENIES/REPORTS:21021675::"Denies"} Difficulty climbing stairs: {ACTIONS;DENIES/REPORTS:21021675::"Denies"} Difficulty getting out of chair: {ACTIONS;DENIES/REPORTS:21021675::"Denies"} Difficulty using hands for taps, buttons, cutlery, and/or writing: {ACTIONS;DENIES/REPORTS:21021675::"Denies"}  No Rheumatology ROS completed.   PMFS History:  Patient Active Problem List   Diagnosis Date Noted  . Pain due to onychomycosis of toenails of both feet 05/22/2020  . Ventricular tachycardia ([place]) 04/23/2020  . Chest pain 04/10/2020  . Chronic narcotic dependence ([place]) 12/14/2019  . Callus of foot 11/08/2019  . Plantar flexed metatarsal bone of left foot 11/08/2019  . COVID-19 virus infection 04/12/2019  . Long term prescription opiate use 12/14/2018  . Neuropathy 12/14/2018  . Stage 3b chronic kidney disease ([place]) 12/14/2018  . Problems with swallowing and mastication   . Achalasia   . Right inguinal hernia 07/04/2018  . Advance directive discussed with patient 04/28/2018  . Chronic pain of both lower extremities 10/26/2017  . Neuropathic pain 10/26/2017  . Chronic pain syndrome 10/26/2017  . Iron deficiency anemia 07/05/2017  . Constipation 07/05/2017  . CKD (chronic kidney disease) stage 4, GFR 15-29 ml/min ([name]) 05/17/2017  . Seropositive rheumatoid arthritis ([place]) 05/17/2017  . DM (diabetes mellitus) type II controlled, neurological  manifestation ([place]) 05/17/2017  . Atherosclerotic heart disease of native coronary artery with angina pectoris ([place]) 05/17/2017  . Dysphagia 05/17/2017  . High risk medication use 10/21/2016  . Primary osteoarthritis of both knees 10/21/2016  . History of right knee joint replacement 10/21/2016  . Orthostatic hypotension 10/01/2016  . Tegretol-induced dizziness 05/14/2016  . Spinal stenosis of lumbar region without neurogenic claudication 03/23/2016  . Lumbar facet arthropathy 03/23/2016  . Respiratory failure, chronic ([place]) 11/21/2014  . Rheumatoid arthritis ([place]) 11/05/2014  . Rheumatic fever without heart involvement 03/04/2014  . Ventral hernia 12/17/2013  . Routine general medical examination at a health care facility 08/29/2012  . Routine history and physical examination of adult 08/29/2012  . ILD (interstitial lung disease) ([place]) 11/28/2011  . Chronic diastolic heart failure ([place]) 09/14/2011  . Diabetic polyneuropathy associated with type 2 diabetes mellitus ([facility]) 08/10/2010  . Obstructive sleep apnea 12/17/2008  . ESOPHAGEAL STRICTURE 10/09/2008  . Esophageal stricture 10/09/2008  . Reflux esophagitis 09/10/2008  . Diverticulosis of colon 09/10/2008  . Gastro-esophageal reflux disease with esophagitis 09/10/2008  . Coronary atherosclerosis 03/19/2008  . Aortic valve disorder 03/19/2008  . Thoracic aorta atherosclerosis ([place]) 03/19/2008  . Actinic keratosis 10/23/2007  . SLEEP DISORDER, CHRONIC 10/17/2006  . Disturbance in sleep behavior 10/17/2006  . 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 Essential hypertension 09/23/2006  . GERD 09/23/2006  . BENIGN PROSTATIC HYPERTROPHY 09/23/2006  . Enlarged prostate without lower urinary tract symptoms (luts) 09/23/2006  . HLD (hyperlipidemia) 09/23/2006    Past Medical History:  Diagnosis Date  . Arthritis    osteoarthritis, s/p R TKR, and digits  . CAD (coronary artery disease)    a. s/p CABG (2001)  b. s/p DES to RCA and cutting POBA to ostial PDA (2013)   c. s/p DES to SVG to OM2 (01/14/14) d. cath: 08/2015 NSTEMI w/ patent LIMA-LAD and 99% stenosis of SVG-OM w/ DES placed. CTO of SVG-RCA and SVG-D1.   . Cataract   . Chronic diastolic CHF (congestive heart failure) ([place])    a) 09/13 ECHO- LVEF 56-21%, grade 1 diastolic dysfunction, mild LA dilatation, atrial septal aneurysm, AV mobility restricted, but no sig AS by doppler; b) 09/04/08 ECHO- LVH, ef 60%, mild AS, c. echo 08/2015: EF perserved of 55-60% with inferolateral HK. Mild AS noted.  . Chronic kidney disease, stage III (moderate) ([place])   . Chronic lower back pain   . Colon polyps   . COVID-19   . Diverticulosis   . Dyspnea 2009 since July -Sept   05/06/08-CPST-  normal effort, reduced VO2 max 20.5 /65%, reduced at 8.2/ 40%, normal breathing resetvca of 55%, submaximal heart rate response 112/77%, flattened o2 pluse response at peak exercise-12 ml/beat @ 85%, No VQ mismatch abnormalities, All c/w CIRC Limitation  . Enlarged prostate   . Esophageal stricture    a. s/p dilation spring 2010  . GERD (gastroesophageal reflux disease)   . Heart murmur   . Hiatal hernia   . History of carpal tunnel syndrome    Bilateral  . History of kidney stones   . History of PFTs    mixed pattern on spiro. mild restn on lung volumes with near normal DLCO. Pattern can be explained by CABG scar. Fev1 2.2L/73%, ratio 68 (67), TLC 4.7/68%,RV 1.5L/55%,DLCO 79%  . Hyperlipidemia   . Hypertension   . Interstitial lung disease ([name])    NOS  . Iron deficiency anemia   . Nausea & vomiting    2018/2019  . On home oxygen therapy    2 L [place] at bedtime  . Osteoporosis   . Overweight (BMI 25.0-29.9)    BMI 29  . Peripheral neuropathy   . RA (rheumatoid arthritis) ([name])    [name]  . Seropositive rheumatoid arthritis ([place])   . Type II diabetes mellitus ([name])    diet controlled  . Wears glasses   . Wears partial dentures    upper    Family History   Problem Relation Age of Onset  . COPD Mother   . Heart disease Father   . Heart attack Father   . Stomach cancer Brother   . Stroke Sister   . Alcohol abuse Sister   . Colon cancer Brother 25  . Diabetes Brother   . Rectal cancer Neg Hx    Past Surgical History:  Procedure Laterality Date  . BALLOON DILATION N/A 09/12/2018   Procedure: BALLOON DILATION;  Surgeon: Irene Shipper, MD;  Location[facility];  Service: Endoscopy;  Laterality: N/A;  . BALLOON DILATION N/A 02/20/2020   Procedure: BALLOON DILATION;  Surgeon: Irene Shipper, MD;  Location: [facility];  Service: Endoscopy;  Laterality: N/A;  . BOTOX INJECTION N/A 09/12/2018   Procedure: BOTOX INJECTION;  Surgeon: Irene Shipper, MD;  Location: [facility];  Service: Endoscopy;  Laterality: N/A;  . BOTOX INJECTION N/A 11/27/2018   Procedure: BOTOX INJECTION;  Surgeon: Irene Shipper, MD;  Location: [facility];  Service: Endoscopy;  Laterality: N/A;  . BOTOX INJECTION N/A 02/20/2020   Procedure: BOTOX INJECTION;  Surgeon: Irene Shipper, MD;  Location: [facility];  Service: Endoscopy;  Laterality: N/A;  . CARDIAC CATHETERIZATION  08/2004   CP-  no MI, Cath- small vessell disease   . CARDIAC CATHETERIZATION  12/31/2011   80% distal LM, 100% native LAD, LCx and RCA, 30% prox SVG-OM, SVG-D1 normal, 99% distal, 80% ostial SVG-RCA distal to graft, LIMA-LAD normal; LVEF mildly decreased with posterior basal AK   . CARDIAC CATHETERIZATION  2009   with patent grafts/notes 12/31/2011  . CARDIAC CATHETERIZATION N/A 08/13/2015   Procedure: Left Heart Cath and Cors/Grafts Angiography;  Surgeon: Sherren Mocha, MD;  Location: [facility];  Service: Cardiovascular;  Laterality: N/A;  . CATARACT EXTRACTION W/ INTRAOCULAR LENS  IMPLANT, BILATERAL Bilateral   . CHOLECYSTECTOMY OPEN  11/2003   [name]  . CORONARY ANGIOPLASTY WITH STENT PLACEMENT  01/03/2012   Successful DES to SVG-RCA and cutting balloon angioplasty ostial  PDA   . CORONARY  ANGIOPLASTY WITH STENT PLACEMENT  01/14/2014   "1"  . CORONARY ARTERY BYPASS GRAFT  11/1999   CABG X5  . CORONARY STENT PLACEMENT  02/2012   1 stent and balloon  . ESOPHAGEAL DILATION  11/27/2018   Procedure: ESOPHAGEAL DILATION;  Surgeon: Irene Shipper, MD;  Location: [facility];  Service: Endoscopy;;  . ESOPHAGOGASTRODUODENOSCOPY N/A 03/01/2017   Procedure: ESOPHAGOGASTRODUODENOSCOPY (EGD);  Surgeon: Irene Shipper, MD;  Location: [facility];  Service: Endoscopy;  Laterality: N/A;  . ESOPHAGOGASTRODUODENOSCOPY (EGD) WITH ESOPHAGEAL DILATION  2010  . ESOPHAGOGASTRODUODENOSCOPY (EGD) WITH PROPOFOL N/A 09/12/2018   Procedure: ESOPHAGOGASTRODUODENOSCOPY (EGD) WITH PROPOFOL;  Surgeon: Irene Shipper, MD;  Location: [facility];  Service: Endoscopy;  Laterality: N/A;  . ESOPHAGOGASTRODUODENOSCOPY (EGD) WITH PROPOFOL N/A 11/27/2018   Procedure: ESOPHAGOGASTRODUODENOSCOPY (EGD) WITH PROPOFOL, WITH BALLOON DILATION;  Surgeon: Irene Shipper, MD;  Location: [facility];  Service: Endoscopy;  Laterality: N/A;  . ESOPHAGOGASTRODUODENOSCOPY (EGD) WITH PROPOFOL N/A 02/20/2020   Procedure: ESOPHAGOGASTRODUODENOSCOPY (EGD) WITH PROPOFOL;  Surgeon: Irene Shipper, MD;  Location: [facility];  Service: Endoscopy;  Laterality: N/A;  . HAND SURGERY     bilateral carpal tunnel releases  . JOINT REPLACEMENT    . KNEE ARTHROSCOPY Right 2008  . LEFT AND RIGHT HEART CATHETERIZATION WITH CORONARY ANGIOGRAM  12/31/2011   Procedure: LEFT AND RIGHT HEART CATHETERIZATION WITH CORONARY ANGIOGRAM;  Surgeon: Burnell Blanks, MD;  Location: [facility] CATH LAB;  Service: Cardiovascular;;  . LEFT AND RIGHT HEART CATHETERIZATION WITH CORONARY ANGIOGRAM N/A 01/14/2014   Procedure: LEFT AND RIGHT HEART CATHETERIZATION WITH CORONARY ANGIOGRAM;  Surgeon: Peter M Martinique, MD;  Location: [facility] CATH LAB;  Service: Cardiovascular;  Laterality: N/A;  . MALONEY DILATION  03/01/2017   Procedure: Venia Minks DILATION;  Surgeon: Irene Shipper, MD;   Location: [facility];  Service: Endoscopy;;  . PERCUTANEOUS CORONARY INTERVENTION-BALLOON ONLY  01/03/2012   Procedure: PERCUTANEOUS CORONARY INTERVENTION-BALLOON ONLY;  Surgeon: Peter M Martinique, MD;  Location: [facility] CATH LAB;  Service: Cardiovascular;;  . PERCUTANEOUS CORONARY STENT INTERVENTION (PCI-S)  12/31/2011   Procedure: PERCUTANEOUS CORONARY STENT INTERVENTION (PCI-S);  Surgeon: Burnell Blanks, MD;  Location: [facility] C[facility] LAB;  Service: Cardiovascular;;  . PERCUTANEOUS CORONARY STENT INTERVENTION (PCI-S) N/A 01/03/2012   Procedure: PERCUTANEOUS CORONARY STENT INTERVENTION (PCI-S);  Surgeon: Peter M Martinique, MD;  Location[facility] CATH LAB;  Service: Cardiovascular;  Laterality: N/A;  . SHOULDER ARTHROSCOPY WITH OPEN ROTATOR CUFF REPAIR AND DISTAL CLAVICLE ACROMINECTOMY Left 02/27/2013   Procedure: LEFT SHOULDER ARTHROSCOPY WITH MINI OPEN ROTATOR CUFF REPAIR AND SUBACROMIAL DECOMPRESSION AND DISTAL CLAVICLE RESECTION;  Surgeon: [name], MD;  Location: [facility];  Service: Orthopedics;  Laterality: Left;  .  TOTAL KNEE ARTHROPLASTY Right 03/2010   [name]  . TRIGGER FINGER RELEASE Left 02/27/2013   Procedure: RELEASE TRIGGER FINGER/A-1 PULLEY;  Surgeon: [name], MD;  Location: [facility];  Service: Orthopedics;  Laterality: Left;   Social History   Social History Narrative   No living will   Requests wife as health care POA-- alternate is daughter Hassan Rowan   Discussed DNR --he requests this (done 08/29/12)   Not sure about feeding tube---but might accept for some time   Patient lives with wife and daughter in a one story home.  Has 3 children.  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   Immunization History  Administered Date(s) Administered  . H1N1 03/27/2008  . Influenza Split 12/29/2010, 01/18/2012  . Influenza Whole 02/03/2007, 01/09/2008, 01/01/2009, 12/31/2009  . Influenza,inj,Quad PF,6+ Mos 12/13/2012, 01/15/2014, 01/15/2015, 01/06/2016, 01/03/2017,  02/09/2018, 01/30/2019, 01/05/2020  . PFIZER(Purple Top)SARS-COV-2 Vaccination 04/26/2019, 05/17/2019  . Pneumococcal Conjugate-13 09/10/2013  . Pneumococcal Polysaccharide-23 03/05/1997, 08/17/2011  . Td 03/05/1997, 08/17/2011     Objective: Vital Signs: There were no vitals taken for this visit.   Physical Exam   Musculoskeletal Exam: ***  CDAI Exam: CDAI Score: -- Patient Global: --; Provider Global: -- Swollen: --; Tender: -- Joint Exam 06/05/2020   No joint exam has been documented for this visit   There is currently no information documented on the homunculus. Go to the Rheumatology activity and complete the homunculus joint exam.  Investigation: No additional findings.  Imaging: No results found.  Recent Labs: Lab Results  Component Value Date   WBC 9.4 04/30/2020   HGB 15.2 04/30/2020   PLT 195 04/30/2020   NA 139 04/30/2020   K 4.0 04/30/2020   CL 99 04/30/2020   CO2 29 04/30/2020   GLUCOSE 252 (H) 04/30/2020   BUN 38 (H) 04/30/2020   CREATININE 2.11 (H) 04/30/2020   BILITOT 0.8 06/13/2019   ALKPHOS 58 06/13/2019   AST 14 06/13/2019   ALT 9 06/13/2019   PROT 8.0 06/13/2019   ALBUMIN 4.1 06/13/2019   ALBUMIN 4.1 06/13/2019   CALCIUM 9.9 04/30/2020   GFRAA 30 (L) 06/24/2017    Speciality Comments: D/c Imuran and Cellcept in the past due to GI SE  Procedures:  No procedures performed Allergies: Doxazosin mesylate and Methocarbamol   Assessment / Plan:     Visit Diagnoses: No diagnosis found.  Orders: No orders of the defined types were placed in this encounter.  No orders of the defined types were placed in this encounter.   Face-to-face time spent with patient was *** minutes. Greater than 50% of time was spent in counseling and coordination of care.  Follow-Up Instructions: No follow-ups on file.   Earnestine Mealing, CMA  Note - This record has been created using Editor, commissioning.  Chart creation errors have been sought, but may not  always  have been located. Such creation errors do not reflect on  the standard of medical care.

## 2020-06-05 ENCOUNTER — Ambulatory Visit: Payer: Medicare Other | Admitting: Rheumatology

## 2020-06-05 DIAGNOSIS — Z8679 Personal history of other diseases of the circulatory system: Secondary | ICD-10-CM

## 2020-06-05 DIAGNOSIS — Z8669 Personal history of other diseases of the nervous system and sense organs: Secondary | ICD-10-CM

## 2020-06-05 DIAGNOSIS — Z8719 Personal history of other diseases of the digestive system: Secondary | ICD-10-CM

## 2020-06-05 DIAGNOSIS — M1712 Unilateral primary osteoarthritis, left knee: Secondary | ICD-10-CM

## 2020-06-05 DIAGNOSIS — I5032 Chronic diastolic (congestive) heart failure: Secondary | ICD-10-CM

## 2020-06-05 DIAGNOSIS — Z8639 Personal history of other endocrine, nutritional and metabolic disease: Secondary | ICD-10-CM

## 2020-06-05 DIAGNOSIS — J849 Interstitial pulmonary disease, unspecified: Secondary | ICD-10-CM

## 2020-06-05 DIAGNOSIS — M47816 Spondylosis without myelopathy or radiculopathy, lumbar region: Secondary | ICD-10-CM

## 2020-06-05 DIAGNOSIS — Z96651 Presence of right artificial knee joint: Secondary | ICD-10-CM

## 2020-06-05 DIAGNOSIS — N184 Chronic kidney disease, stage 4 (severe): Secondary | ICD-10-CM

## 2020-06-05 DIAGNOSIS — Z79899 Other long term (current) drug therapy: Secondary | ICD-10-CM

## 2020-06-05 DIAGNOSIS — M059 Rheumatoid arthritis with rheumatoid factor, unspecified: Secondary | ICD-10-CM

## 2020-06-05 DIAGNOSIS — R202 Paresthesia of skin: Secondary | ICD-10-CM

## 2020-06-11 ENCOUNTER — Ambulatory Visit
Payer: Medicare Other | Attending: Student in an Organized Health Care Education/Training Program | Admitting: Student in an Organized Health Care Education/Training Program

## 2020-06-11 ENCOUNTER — Encounter: Payer: Self-pay | Admitting: Student in an Organized Health Care Education/Training Program

## 2020-06-11 ENCOUNTER — Other Ambulatory Visit: Payer: Self-pay

## 2020-06-11 VITALS — BP 133/77 | HR 66 | Temp 97.1°F | Resp 16 | Ht 72.0 in | Wt 198.0 lb

## 2020-06-11 DIAGNOSIS — M47816 Spondylosis without myelopathy or radiculopathy, lumbar region: Secondary | ICD-10-CM

## 2020-06-11 DIAGNOSIS — Z79891 Long term (current) use of opiate analgesic: Secondary | ICD-10-CM

## 2020-06-11 DIAGNOSIS — M79605 Pain in left leg: Secondary | ICD-10-CM | POA: Insufficient documentation

## 2020-06-11 DIAGNOSIS — E1142 Type 2 diabetes mellitus with diabetic polyneuropathy: Secondary | ICD-10-CM

## 2020-06-11 DIAGNOSIS — M059 Rheumatoid arthritis with rheumatoid factor, unspecified: Secondary | ICD-10-CM | POA: Diagnosis not present

## 2020-06-11 DIAGNOSIS — M792 Neuralgia and neuritis, unspecified: Secondary | ICD-10-CM | POA: Diagnosis not present

## 2020-06-11 DIAGNOSIS — N183 Chronic kidney disease, stage 3 unspecified: Secondary | ICD-10-CM | POA: Insufficient documentation

## 2020-06-11 DIAGNOSIS — G894 Chronic pain syndrome: Secondary | ICD-10-CM | POA: Diagnosis not present

## 2020-06-11 DIAGNOSIS — G8929 Other chronic pain: Secondary | ICD-10-CM

## 2020-06-11 DIAGNOSIS — M79604 Pain in right leg: Secondary | ICD-10-CM | POA: Diagnosis not present

## 2020-06-11 DIAGNOSIS — E1122 Type 2 diabetes mellitus with diabetic chronic kidney disease: Secondary | ICD-10-CM | POA: Diagnosis not present

## 2020-06-11 MED ORDER — HYDROCODONE-ACETAMINOPHEN 10-325 MG PO TABS: 1.0000 | ORAL_TABLET | Freq: Two times a day (BID) | ORAL | 0 refills | Status: DC | PRN

## 2020-06-11 MED ORDER — HYDROCODONE-ACETAMINOPHEN 10-325 MG PO TABS: 1.0000 | ORAL_TABLET | Freq: Two times a day (BID) | ORAL | 0 refills | Status: AC | PRN

## 2020-06-11 MED ORDER — PREGABALIN 50 MG PO CAPS
50.0000 mg | ORAL_CAPSULE | Freq: Every day | ORAL | 0 refills | Status: DC
Start: 2020-06-11 — End: 2020-09-09

## 2020-06-11 NOTE — Patient Instructions (Signed)
Preparing for Procedure with Sedation Instructions: . Oral Intake: Do not eat or drink anything for at least 8 hours prior to your procedure. . Transportation: Public transportation is not allowed. Bring an adult driver. The driver must be physically present in our waiting room before any procedure can be started. . Physical Assistance: Bring an adult capable of physically assisting you, in the event you need help. . Blood Pressure Medicine: Take your blood pressure medicine with a sip of water the morning of the procedure. . Insulin: Take only  of your normal insulin dose. . Preventing infections: Shower with an antibacterial soap the morning of your procedure. . Build-up your immune system: Take 1000 mg of Vitamin C with every meal (3 times a day) the day prior to your procedure. . Pregnancy: If you are pregnant, call and cancel the procedure. . Sickness: If you have a cold, fever, or any active infections, call and cancel the procedure. . Arrival: You must be in the facility at least 30 minutes prior to your scheduled procedure. . Children: Do not bring children with you. . Dress appropriately: Bring dark clothing that you would not mind if they get stained. . Valuables: Do not bring any jewelry or valuables. Procedure appointments are reserved for interventional treatments only. . No Prescription Refills. . No medication changes will be discussed during procedure appointments. . No disability issues will be discussed. .  

## 2020-06-11 NOTE — Progress Notes (Signed)
PROVIDER NOTE: Information contained herein reflects review and annotations entered in association with encounter. Interpretation of such information and data should be left to medically-trained personnel. Information provided to patient can be located elsewhere in the medical record under "Patient Instructions". Document created using STT-dictation technology, any transcriptional errors that may result from process are unintentional.    Patient: [name]  Service Category: E/M  Provider: Gillis Santa, MD  DOB: June 13, 1931  DOS: 06/11/2020  Specialty: Interventional Pain Management  MRN: [id number]  Setting: Ambulatory outpatient  PCP: [name], MD  Type: Established Patient    Referring Provider: [name], MD  Location: Office  Delivery: Face-to-face     HPI  [name], a 87 y.o. year old male, is here today because of his Lumbar facet arthropathy [M47.816]. [name] primary complain today is Back Pain Last encounter: My last encounter with him was on 04/10/2020. Pertinent problems: [name] has CKD (chronic kidney disease) stage 4, GFR 15-29 ml/min ([place]); Spinal stenosis of lumbar region without neurogenic claudication; Lumbar facet arthropathy; Chronic pain of both lower extremities; Neuropathic pain; Chronic pain syndrome; and Stage 3b chronic kidney disease ([place]) on their pertinent problem list. Pain Assessment: Severity of Chronic pain is reported as a 4/10. Location: Back Lower/denies. Onset: More than a month ago. Quality: Aching. Timing: Intermittent. Modifying factor(s): sitting. Vitals:  height is 6' (1.829 m) and weight is 198 lb (89.8 kg). His temperature is 97.1 F (36.2 C) (abnormal). His blood pressure is 133/77 and his pulse is 66. His respiration is 16 and oxygen saturation is 97%.   Reason for encounter: medication management.  & Worsening low back pain  [name] follows up today for medication management.  No significant changes in his  medical history since his last visit.  Patient had a fall at the end of January and is still having pretty significant lower back pain since his fall more predominant on the right.  He has a history of lumbar facet arthropathy and lumbar spondylosis for which she has received diagnostic lumbar facet medial branch nerve blocks which provided approximately 80% pain relief for 3 to 4 weeks with gradual return of pain thereafter.  Given this change in his baseline with his low back pain, we discussed performing a right lumbar radiofrequency ablation at L3, L4, L5.  Risks and benefits of this procedure were discussed in great detail and patient states that he will think about this further before scheduling.  I have placed a as needed order as below.  Otherwise I will refill his hydrocodone as previously prescribed.  The patient takes 10 to 20 mg twice daily as needed for pain.  He is also finding some benefit with Lyrica at 50 mg nightly so I will refill that as well.  Pharmacotherapy Assessment   Analgesic: Hydrocodone 10 to 20 mg twice daily as needed    Monitoring: [place] PMP: PDMP not reviewed this encounter.       Pharmacotherapy: No side-effects or adverse reactions reported. Compliance: No problems identified. Effectiveness: Clinically acceptable.  [name], RN  06/11/2020  1:28 PM  Signed Nursing Pain Medication Assessment:  Safety precautions to be maintained throughout the outpatient stay will include: orient to surroundings, keep bed in low position, maintain call bell within reach at all times, provide assistance with transfer out of bed and ambulation.  Medication Inspection Compliance: Pill count conducted under aseptic conditions, in front of the patient. Neither the pills nor the  bottle was removed from the patient's sight at any time. Once count was completed pills were immediately returned to the patient in their original bottle.  Medication: Hydrocodone/APAP Pill/Patch Count: 17 of 120  pills remain Pill/Patch Appearance: Markings consistent with prescribed medication Bottle Appearance: Standard pharmacy container. Clearly labeled. Filled Date: 02 / 07 / 2022 Last Medication intake:  Today    UDS:  Summary  Date Value Ref Range Status  09/18/2019 Note  Final    Comment:    ==================================================================== [facility] (MW) ==================================================================== Test                             Result       Flag       Units  Drug Present and Declared for Prescription Verification   Hydrocodone                    1687         EXPECTED   ng/mg creat   Hydromorphone                  2436         EXPECTED   ng/mg creat   Dihydrocodeine                 436          EXPECTED   ng/mg creat   Norhydrocodone                 1384         EXPECTED   ng/mg creat    Sources of hydrocodone include scheduled prescription medications.    Hydromorphone, dihydrocodeine and norhydrocodone are expected    metabolites of hydrocodone. Hydromorphone and dihydrocodeine are    also available as scheduled prescription medications.  ==================================================================== Test                      Result    Flag   Units      Ref Range   Creatinine              45               mg/dL      >=20 ==================================================================== Declared Medications:  The flagging and interpretation on this report are based on the  following declared medications.  Unexpected results may arise from  inaccuracies in the declared medications.   **Note: The testing scope of this panel includes these medications:   Hydrocodone (Norco)  Hydrocodone   **Note: The testing scope of this panel does not include the  following reported medications:   Acetaminophen (Norco)  Amlodipine (Norvasc)  Aspirin  Carvedilol (Coreg)  Clopidogrel (Plavix)  Furosemide (Lasix)  Helium   Losartan (Cozaar)  Nitroglycerin (Nitrostat)  Ondansetron (Zofran)  Oxygen  Pantoprazole (Protonix)  Polyethylene Glycol  Tamsulosin (Flomax) ==================================================================== For clinical consultation, please call [phone]. ====================================================================      ROS  Constitutional: Denies any fever or chills Gastrointestinal: No reported hemesis, hematochezia, vomiting, or acute GI distress Musculoskeletal: +LBP Neurological: No reported episodes of acute onset apraxia, aphasia, dysarthria, agnosia, amnesia, paralysis, loss of coordination, or loss of consciousness  Medication Review  DHA-Vitamin C-Lutein, HYDROcodone-acetaminophen, ONE TOUCH ULTRA 2, OneTouch Delica Lancets 43H, Oxygen-Helium, amLODipine, aspirin EC, carvedilol, clopidogrel, furosemide, glucose blood, hydrALAZINE, nitroGLYCERIN, ondansetron, pantoprazole, polyethylene glycol, pregabalin, and tamsulosin  History Review  Allergy: Mr. Levenhagen is allergic to  doxazosin mesylate and methocarbamol. Drug: Mr. Sibal  reports no history of drug use. Alcohol:  reports no history of alcohol use. Tobacco:  reports that he quit smoking about 57 years ago. His smoking use included cigarettes. He has a 20.00 pack-year smoking history. He has quit using smokeless tobacco. Social: Mr. Kendzierski  reports that he quit smoking about 57 years ago. His smoking use included cigarettes. He has a 20.00 pack-year smoking history. He has quit using smokeless tobacco. He reports that he does not drink alcohol and does not use drugs. Medical:  has a past medical history of Arthritis, CAD (coronary artery disease), Cataract, Chronic diastolic CHF (congestive heart failure) ([place]), Chronic kidney disease, stage III (moderate) ([place]), Chronic lower back pain, Colon polyps, COVID-19, Diverticulosis, Dyspnea (2009 since July -Sept), Enlarged prostate, Esophageal stricture, GERD  (gastroesophageal reflux disease), Heart murmur, Hiatal hernia, History of carpal tunnel syndrome, History of kidney stones, History of PFTs, Hyperlipidemia, Hypertension, Interstitial lung disease ([place]), Iron deficiency anemia, Nausea & vomiting, On home oxygen therapy, Osteoporosis, Overweight (BMI 25.0-29.9), Peripheral neuropathy, RA (rheumatoid arthritis) ([place]), Seropositive rheumatoid arthritis ([place]), Type II diabetes mellitus ([place]), Wears glasses, and Wears partial dentures. Surgical: Mr. Sorg  has a past surgical history that includes Total knee arthroplasty (Right, 03/2010); Knee arthroscopy (Right, 2008); Cataract extraction w/ intraocular lens  implant, bilateral (Bilateral); Coronary artery bypass graft (11/1999); Coronary stent placement (02/2012); Shoulder arthroscopy with open rotator cuff repair and distal clavicle acrominectomy (Left, 02/27/2013); Trigger finger release (Left, 02/27/2013); Cholecystectomy open (11/2003); Joint replacement; Esophagogastroduodenoscopy (egd) with esophageal dilation (2010); Cardiac catheterization (08/2004); Cardiac catheterization (12/31/2011); Cardiac catheterization (2009); left and right heart catheterization with coronary angiogram (12/31/2011); percutaneous coronary stent intervention (pci-s) (12/31/2011); percutaneous coronary stent intervention (pci-s) (N/A, 01/03/2012); Percutaneous coronary intervention-balloon only (01/03/2012); left and right heart catheterization with coronary angiogram (N/A, 01/14/2014); Hand surgery; Cardiac catheterization (N/A, 08/13/2015); Esophagogastroduodenoscopy (N/A, 03/01/2017); maloney dilation (03/01/2017); Esophagogastroduodenoscopy (egd) with propofol (N/A, 09/12/2018); Botox injection (N/A, 09/12/2018); Balloon dilation (N/A, 09/12/2018); Coronary angioplasty with stent (01/03/2012); Coronary angioplasty with stent (01/14/2014); Esophagogastroduodenoscopy (egd) with propofol (N/A, 11/27/2018); Botox injection (N/A, 11/27/2018);  Esophageal dilation (11/27/2018); Esophagogastroduodenoscopy (egd) with propofol (N/A, 02/20/2020); Botox injection (N/A, 02/20/2020); and Balloon dilation (N/A, 02/20/2020). Family: family history includes Alcohol abuse in his sister; COPD in his mother; Colon cancer (age of onset: 108) in his brother; Diabetes in his brother; Heart attack in his father; Heart disease in his father; Stomach cancer in his brother; Stroke in his sister.  Laboratory Chemistry Profile   Renal Lab Results  Component Value Date   BUN 38 (H) 04/30/2020   CREATININE 2.11 (H) 04/30/2020   BCR 13 06/24/2017   GFR 30.78 (L) 06/13/2019   GFRAA 30 (L) 06/24/2017   GFRNONAA 30 (L) 04/30/2020     Hepatic Lab Results  Component Value Date   AST 14 06/13/2019   ALT 9 06/13/2019   ALBUMIN 4.1 06/13/2019   ALBUMIN 4.1 06/13/2019   ALKPHOS 58 06/13/2019     Electrolytes Lab Results  Component Value Date   NA 139 04/30/2020   K 4.0 04/30/2020   CL 99 04/30/2020   CALCIUM 9.9 04/30/2020   MG 2.1 04/12/2020   PHOS 2.9 06/13/2019     Bone No results found for: VD25OH, VD125OH2TOT, SW5462VO3, JK0938HW2, 25OHVITD1, 25OHVITD2, 25OHVITD3, TESTOFREE, TESTOSTERONE   Inflammation (CRP: Acute Phase) (ESR: Chronic Phase) Lab Results  Component Value Date   ESRSEDRATE 38 (H) 11/22/2011       Note: Above Lab results reviewed.  Recent Imaging Review  CT Head Wo Contrast CLINICAL DATA:  Fall last night, dizzy, head trauma  EXAM: CT HEAD WITHOUT CONTRAST  TECHNIQUE: Contiguous axial images were obtained from the base of the skull through the vertex without intravenous contrast.  COMPARISON:  None.  FINDINGS: Brain: Brain atrophy noted with minor white matter microvascular changes about the lateral ventricles. No acute intracranial hemorrhage, new infarction, midline shift, herniation, mass lesion or extra-axial fluid collection. No focal mass effect or edema. Cisterns are patent. Cerebellar atrophy as  well.  Vascular: No hyperdense vessel or unexpected calcification.  Skull: Normal. Negative for fracture or focal lesion.  Sinuses/Orbits: No acute finding.  Other: None.  IMPRESSION: Age related brain atrophy and white matter microvascular changes.  No acute intracranial abnormality by noncontrast CT.  Electronically Signed   By: Jerilynn Mages.  Shick M.D.   On: 04/30/2020 13:20 DG Hip Unilat W or Wo Pelvis 2-3 Views Right CLINICAL DATA:  Fall.  EXAM: DG HIP (WITH OR WITHOUT PELVIS) 2-3V RIGHT  COMPARISON:  MRI lumbar spine 03/12/2016.  FINDINGS: Diffuse osteopenia. Degenerative changes lumbar spine and both hips. No acute bony abnormality. No evidence of fracture or dislocation. Prominent right inguinal hernia. No bowel distention. Pelvic calcifications consistent phleboliths. Aortoiliac atherosclerotic vascular calcification.  IMPRESSION: 1. Degenerative changes lumbar spine and both hips. No acute bony abnormality. 2. Prominent right inguinal hernia. 3. Aortoiliac atherosclerotic vascular disease.  Electronically Signed   By: [name]  Register   On: 04/30/2020 12:58 DG Lumbar Spine Complete CLINICAL DATA:  Reason for exam: fall Patient reports fall today with bilateral lower back pain approximately at L3. Per ed triage notes: "Patient BIB [facility] after a fall last night. Patient was walking when he got dizzy and fell. Was assisted .*comment was truncated*fall  EXAM: LUMBAR SPINE - COMPLETE 4+ VIEW  COMPARISON:  None.  FINDINGS: There is no evidence of lumbar spine fracture. Alignment is normal. Intervertebral disc spaces are maintained. Degenerative osteophytosis of the spine.  There is mild loss of vertebral body height at T11 anteriorly measuring approximately 15%  Atherosclerotic calcification of the aorta.  IMPRESSION: 1. No acute findings lumbar spine. 2. Mild loss of vertebral body height anteriorly at T11. Age indeterminate compression fracture 3.  Moderate disc osteophytic disease. 4.  Atherosclerotic calcification of the aorta.  Electronically Signed   By: Suzy Bouchard M.D.   On: 04/30/2020 12:29 Note: Reviewed        Physical Exam  General appearance: Well nourished, well developed, and well hydrated. In no apparent acute distress Mental status: Alert, oriented x 3 (person, place, & time)       Respiratory: No evidence of acute respiratory distress Eyes: PERLA Vitals: BP 133/77   Pulse 66   Temp (!) 97.1 F (36.2 C)   Resp 16   Ht 6' (1.829 m)   Wt 198 lb (89.8 kg)   SpO2 97%   BMI 26.85 kg/m  BMI: Estimated body mass index is 26.85 kg/m as calculated from the following:   Height as of this encounter: 6' (1.829 m).   Weight as of this encounter: 198 lb (89.8 kg). Ideal: Ideal body weight: 77.6 kg (171 lb 1.2 oz) Adjusted ideal body weight: 82.5 kg (181 lb 13.5 oz)   Lumbar Spine Area Exam  Skin & Axial Inspection:No masses, redness, or swelling Alignment:Symmetrical Functional DUK:GURKYHCWC ROM, slightly improved after treatment Stability:No instability detected Muscle Tone/Strength:Functionally intact. No obvious neuro-muscular anomalies detected. Sensory (Neurological):Musculoskeletal pain pattern, improved after treatment Palpation:No palpable  anomalies Provocative Tests: Hyperextension/rotation test:(+)bilaterally for facet joint pain. Lumbar quadrant test (Kemp's test):(+)bilaterally for facet joint pain.  Gait & Posture Assessment  Ambulation:Unassisted Gait:Relatively normal for age and body habitus Posture:WNL Lower Extremity Exam    Side:Right lower extremity  Side:Left lower extremity  Stability:No instability observed  Stability:No instability observed  Skin & Extremity Inspection:Skin color, temperature, and hair growth are WNL. No peripheral edema or cyanosis. No masses, redness, swelling, asymmetry, or associated skin lesions. No  contractures.  Skin & Extremity Inspection:Skin color, temperature, and hair growth are WNL. No peripheral edema or cyanosis. No masses, redness, swelling, asymmetry, or associated skin lesions. No contractures.  Functional DBZ:MCEYEMVVK ROM   Functional PQA:ESLPNPYYF ROM   Muscle Tone/Strength:Functionally intact. No obvious neuro-muscular anomalies detected.  Muscle Tone/Strength:Functionally intact. No obvious neuro-muscular anomalies detected.  Sensory (Neurological):Neuropathic pain pattern  Sensory (Neurological):Neuropathic pain pattern  DTR: Patellar:deferred today Achilles:deferred today Plantar:deferred today  DTR: Patellar:deferred today Achilles:deferred today Plantar:deferred today  Palpation:No palpable anomalies  Palpation:No palpable anomalies    Assessment   Status Diagnosis  Worsening Worsening Worsening 1. Lumbar facet arthropathy   2. Lumbar spondylosis   3. Neuropathic pain   4. Diabetic polyneuropathy associated with type 2 diabetes mellitus ([place])   5. Seropositive rheumatoid arthritis ([place])   6. Chronic pain of both lower extremities   7. CKD stage 3 due to type 2 diabetes mellitus ([place])   8. Long term prescription opiate use   9. Chronic pain syndrome      Plan of Care  Mr. JAIREN GOLDFARB has a current medication list which includes the following long-term medication(s): furosemide, hydralazine, nitroglycerin, pantoprazole, and pregabalin.  Pharmacotherapy (Medications Ordered): Meds ordered this encounter  Medications  . HYDROcodone-acetaminophen (NORCO) 10-325 MG tablet    Sig: Take 1-2 tablets by mouth 2 (two) times daily as needed for severe pain. Must last 30 days.    Dispense:  120 tablet    Refill:  0    Chronic Pain. (STOP Act - Not applicable). Fill one day early if closed on scheduled refill date.  [name] HYDROcodone-acetaminophen (NORCO) 10-325 MG tablet    Sig: Take 1-2 tablets  by mouth 2 (two) times daily as needed for severe pain. Must last 30 days.    Dispense:  120 tablet    Refill:  0    Chronic Pain. (STOP Act - Not applicable). Fill one day early if closed on scheduled refill date.  [name] HYDROcodone-acetaminophen (NORCO) 10-325 MG tablet    Sig: Take 1-2 tablets by mouth 2 (two) times daily as needed for severe pain. Must last 30 days.    Dispense:  120 tablet    Refill:  0    Chronic Pain. (STOP Act - Not applicable). Fill one day early if closed on scheduled refill date.  . pregabalin (LYRICA) 50 MG capsule    Sig: Take 1 capsule (50 mg total) by mouth at bedtime.    Dispense:  90 capsule    Refill:  0    Fill one day early if pharmacy is closed on scheduled refill date. May substitute for generic if available.   Orders:  Orders Placed This Encounter  Procedures  . Radiofrequency,Lumbar    Scheduling timeframe: (PRN procedure) Mr. Livingstone will call when needed. Clinical indication: Axial low back pain. Lumbosacral Spondylosis (M47.897).  Sedation: Usually done with sedation. (May be done without sedation if so desired by patient.) Requirements: NPO x 8 hrs.; Driver; Stop blood thinners.    Standing  Status:   Standing    Number of Occurrences:   1    Standing Expiration Date:   06/11/2021    Scheduling Instructions:     Procedure: Lumbar Facet Joint Medial Branch Radiofrequency Ablation (RFA) with sedation     Level: L3-4, L4-5, Facets ( L3, L4, L5,Medial Branch Nerves)     Laterality: RIGHT    Order Specific Question:   Where will this procedure be performed?    Answer:   [facility]   Follow-up plan:   Return in about 3 months (around 09/11/2020) for Medication Management, in person.      Recent Visits Date Type Provider Dept  05/13/20 Telemedicine Gillis Santa, MD [facility]  04/09/20 Procedure visit Gillis Santa, MD [facility]  Showing recent visits within past 90 days and meeting all other requirements Today's  Visits Date Type Provider Dept  06/11/20 Office Visit Gillis Santa, MD [facility]  Showing today's visits and meeting all other requirements Future Appointments Date Type Provider Dept  09/09/20 Appointment Gillis Santa, MD [facility]  Showing future appointments within next 90 days and meeting all other requirements  I discussed the assessment and treatment plan with the patient. The patient was provided an opportunity to ask questions and all were answered. The patient agreed with the plan and demonstrated an understanding of the instructions.  Patient advised to call back or seek an in-person evaluation if the symptoms or condition worsens.  Duration of encounter: 22mnutes.  Note by: BGillis Santa MD Date: 06/11/2020; Time: 2:36 PM

## 2020-06-11 NOTE — Progress Notes (Signed)
Nursing Pain Medication Assessment:  Safety precautions to be maintained throughout the outpatient stay will include: orient to surroundings, keep bed in low position, maintain call bell within reach at all times, provide assistance with transfer out of bed and ambulation.  Medication Inspection Compliance: Pill count conducted under aseptic conditions, in front of the patient. Neither the pills nor the bottle was removed from the patient's sight at any time. Once count was completed pills were immediately returned to the patient in their original bottle.  Medication: Hydrocodone/APAP Pill/Patch Count: 17 of 120 pills remain Pill/Patch Appearance: Markings consistent with prescribed medication Bottle Appearance: Standard pharmacy container. Clearly labeled. Filled Date: 02 / 07 / 2022 Last Medication intake:  Today

## 2020-06-12 ENCOUNTER — Telehealth: Payer: Self-pay

## 2020-06-12 NOTE — Chronic Care Management (AMB) (Addendum)
Chronic Care Management Pharmacy Assistant   Name: MICKIE KOZIKOWSKI  MRN: [id number] DOB: 30-Apr-1931  Reason for Encounter: Disease State- Diabetes   Conditions to be addressed/monitored: DMII  Recent office visits:  01/01/20- Urgent care- GERD  Recent consult visits:  06/11/20- Dr. Gillis Santa- Pain management 05/22/20- Dr. Gardiner Barefoot- Podiatry 05/13/20- Dr. Gillis Santa- Pain management - Televisit 04/23/20- Dr. Silvio Pate- PCP  04/15/20- Dr. Johnsie Cancel- Cardiology 04/09/20- Dr. Gillis Santa- Pain management- procedure 02/26/20- Dr. Brand Males- Pulmonology 02/20/20- Dr. [name]- EGD procedure 02/14/20- Dr. Rutherford Guys- Ophthalmology  02/12/20- Dr. Gillis Santa - Pain management- Started lyrica $RemoveBefore'25mg'[id number]$  then titrate up to 50 mg at bedtime 02/05/20- Dr. [name]- Gastroenterology 01/31/20- Dr. Gardiner Barefoot- Podiatry 01/22/20- Truitt Merle- NP- Cardiology 01/21/20- Dr. [name]- Rheumatology 01/07/20- Dr. Harrie Jeans- Nephrology 01/07/20- Dr. Lennie Odor- Dermatology 12/17/19- Dr. Ernst Breach- Retina ophthalmology   [facility] visits:  Admitted to the hospital on 04/30/20 due to a fall. Discharge date was 04/30/20. Discharged from [facility].   Admitted to the hospital on 04/10/20 due to a chest pain. Discharge date was 04/13/20. Discharged from [facility].     Medications: Outpatient Encounter Medications as of 06/12/2020  Medication Sig   amLODipine (NORVASC) 5 MG tablet Take 5 mg by mouth daily.    aspirin EC 81 MG tablet Take 81 mg by mouth at bedtime.   Blood Glucose Monitoring Suppl (ONE TOUCH ULTRA 2) w/Device KIT Use to obtain blood sugar daily. Dx Code E11.40   carvedilol (COREG) 6.25 MG tablet Take 6.25 mg by mouth 2 (two) times daily.   clopidogrel (PLAVIX) 75 MG tablet Take 1 tablet (75 mg total) by mouth daily.   DHA-Vitamin C-Lutein (EYE HEALTH FORMULA PO) Take 1 tablet by mouth daily.    furosemide (LASIX) 40 MG tablet Take 1 tablet (40 mg  total) by mouth every Monday, Wednesday, and Friday.   glucose blood (ONE TOUCH ULTRA TEST) test strip USE TO CHECK BLOOD SUGAR ONCE A DAY Dx Code E11.40   hydrALAZINE (APRESOLINE) 25 MG tablet Take 1 tablet (25 mg total) by mouth 2 (two) times daily.   HYDROcodone-acetaminophen (NORCO) 10-325 MG tablet Take 1-2 tablets by mouth 2 (two) times daily as needed for severe pain. Must last 30 days.   [START ON 07/11/2020] HYDROcodone-acetaminophen (NORCO) 10-325 MG tablet Take 1-2 tablets by mouth 2 (two) times daily as needed for severe pain. Must last 30 days.   [START ON 08/10/2020] HYDROcodone-acetaminophen (NORCO) 10-325 MG tablet Take 1-2 tablets by mouth 2 (two) times daily as needed for severe pain. Must last 30 days.   nitroGLYCERIN (NITROSTAT) 0.4 MG SL tablet DISSOLVE 1 TABLET UNDER TONGUE AS NEEDEDFOR CHEST PAIN. MAY REPEAT 5 MINUTES APART 3 TIMES IF NEEDED. IF NO RELIEF CALL 911   ondansetron (ZOFRAN) 4 MG tablet TAKE 1 TABLET BY MOUTH EVERY 8 HOURS AS NEEDED FOR NAUSEA OR VOMITING (Patient taking differently: Take 4 mg by mouth every 8 (eight) hours as needed (nausea/vomiting).)   OneTouch Delica Lancets 38V MISC 1 each by In Vitro route daily. Dx Code E11.49   OXYGEN Inhale into the lungs. Per pt- Uses Oxygen 2 liters at night.   pantoprazole (PROTONIX) 40 MG tablet Take 1 tablet (40 mg total) by mouth daily.   polyethylene glycol (MIRALAX / GLYCOLAX) 17 g packet Take 51 g by mouth every other day.    pregabalin (LYRICA) 50 MG capsule Take 1 capsule (50 mg total) by mouth at  bedtime.   tamsulosin (FLOMAX) 0.4 MG CAPS capsule Take 0.4 mg by mouth at bedtime.    No facility-administered encounter medications on file as of 06/12/2020.     Recent Relevant Labs: Lab Results  Component Value Date/Time   HGBA1C 8.9 (H) 04/11/2020 03:17 AM   HGBA1C 8.1 (A) 12/14/2019 12:17 PM   HGBA1C 8.0 (H) 06/13/2019 10:57 AM   MICROALBUR 7.5 (H) 04/28/2018 12:28 PM   MICROALBUR 0.5 02/04/2009 10:09 AM     Kidney Function Lab Results  Component Value Date/Time   CREATININE 2.11 (H) 04/30/2020 10:39 AM   CREATININE 2.00 (H) 04/13/2020 06:14 AM   CREATININE 2.28 (H) 06/24/2017 11:36 AM   CREATININE 2.43 (H) 05/27/2017 02:09 PM   GFR 30.78 (L) 06/13/2019 10:57 AM   GFRNONAA 30 (L) 04/30/2020 10:39 AM   GFRNONAA 26 (L) 06/24/2017 11:36 AM   GFRAA 30 (L) 06/24/2017 11:36 AM    Current antihyperglycemic regimen:  No pharmacotherapy Patient states he is not taking anything for his diabetes. States he was taken off metformin because they thought it was making him sick. He notes he still feels sick and nauseated without taking it.  What recent interventions/DTPs have been made to improve glycemic control:  No recent interventions  Have there been any recent hospitalizations or ED visits since last visit with CPP? Yes  Patient denies hypoglycemic symptoms, including Pale, Sweaty, Shaky, Hungry, Nervous/irritable and Vision changes  Patient denies hyperglycemic symptoms, including blurry vision, excessive thirst, fatigue, polyuria and weakness  How often are you checking your blood sugar? 1-2 times weeks  What are your blood sugars ranging?  Fasting: 180, 181 Before meals: N/A After meals: N/A Bedtime: N/A  On insulin? No  During the week, how often does your blood glucose drop below 70? Never  Are you checking your feet daily/regularly? Yes  Adherence Review: Is the patient currently on a STATIN medication? No Is the patient currently on ACE/ARB medication? No Does the patient have >5 day gap between last estimated fill dates? CPP to review  Star Rating Drugs:  Medication:  Last Fill: Day Supply No star rating medications identified.   Also scheduled patient for follow up with [name] 07/22/20 at 10:30am. Patient reminded to have all medications and BG readings available for review.  Follow-Up:  Pharmacist Review  [name], CPP notified  Margaretmary Dys,  [place] [phone]  Patient's A1c goal is < 9%. Unable to restart metformin due to CKD. Agree to remain off medication at this time. I have reviewed the care management and care coordination activities outlined in this encounter and I am certifying that I agree with the content of this note. No further action required.  [name], PharmD Clinical Pharmacist [facility] [phone]

## 2020-06-15 DIAGNOSIS — R0689 Other abnormalities of breathing: Secondary | ICD-10-CM | POA: Diagnosis not present

## 2020-06-15 DIAGNOSIS — J8410 Pulmonary fibrosis, unspecified: Secondary | ICD-10-CM | POA: Diagnosis not present

## 2020-06-17 DIAGNOSIS — K222 Esophageal obstruction: Secondary | ICD-10-CM | POA: Diagnosis not present

## 2020-06-17 DIAGNOSIS — R809 Proteinuria, unspecified: Secondary | ICD-10-CM | POA: Diagnosis not present

## 2020-06-17 DIAGNOSIS — E1122 Type 2 diabetes mellitus with diabetic chronic kidney disease: Secondary | ICD-10-CM | POA: Diagnosis not present

## 2020-06-17 DIAGNOSIS — I2510 Atherosclerotic heart disease of native coronary artery without angina pectoris: Secondary | ICD-10-CM | POA: Diagnosis not present

## 2020-06-17 DIAGNOSIS — N184 Chronic kidney disease, stage 4 (severe): Secondary | ICD-10-CM | POA: Diagnosis not present

## 2020-06-17 DIAGNOSIS — I129 Hypertensive chronic kidney disease with stage 1 through stage 4 chronic kidney disease, or unspecified chronic kidney disease: Secondary | ICD-10-CM | POA: Diagnosis not present

## 2020-06-18 ENCOUNTER — Other Ambulatory Visit: Payer: Self-pay

## 2020-06-18 ENCOUNTER — Encounter: Payer: Self-pay | Admitting: Internal Medicine

## 2020-06-18 ENCOUNTER — Ambulatory Visit (INDEPENDENT_AMBULATORY_CARE_PROVIDER_SITE_OTHER): Payer: Medicare Other | Admitting: Internal Medicine

## 2020-06-18 VITALS — BP 98/62 | HR 61 | Temp 97.7°F | Ht 72.0 in | Wt 195.0 lb

## 2020-06-18 DIAGNOSIS — J9611 Chronic respiratory failure with hypoxia: Secondary | ICD-10-CM | POA: Diagnosis not present

## 2020-06-18 DIAGNOSIS — H353220 Exudative age-related macular degeneration, left eye, stage unspecified: Secondary | ICD-10-CM

## 2020-06-18 DIAGNOSIS — I5032 Chronic diastolic (congestive) heart failure: Secondary | ICD-10-CM

## 2020-06-18 DIAGNOSIS — Z0000 Encounter for general adult medical examination without abnormal findings: Secondary | ICD-10-CM

## 2020-06-18 DIAGNOSIS — F1120 Opioid dependence, uncomplicated: Secondary | ICD-10-CM

## 2020-06-18 DIAGNOSIS — N184 Chronic kidney disease, stage 4 (severe): Secondary | ICD-10-CM

## 2020-06-18 DIAGNOSIS — E1140 Type 2 diabetes mellitus with diabetic neuropathy, unspecified: Secondary | ICD-10-CM | POA: Diagnosis not present

## 2020-06-18 DIAGNOSIS — I25119 Atherosclerotic heart disease of native coronary artery with unspecified angina pectoris: Secondary | ICD-10-CM | POA: Diagnosis not present

## 2020-06-18 DIAGNOSIS — H353290 Exudative age-related macular degeneration, unspecified eye, stage unspecified: Secondary | ICD-10-CM | POA: Insufficient documentation

## 2020-06-18 NOTE — Assessment & Plan Note (Signed)
Has stable DOE ---likely more from pulmonary disease then cardiac, but hard to tell Is on plavix, carvedilol No recent nitro--carries it

## 2020-06-18 NOTE — Assessment & Plan Note (Signed)
Lab Results  Component Value Date   HGBA1C 8.9 (H) 04/11/2020   Limited therapeutic options with CKD Would accept sugars if A1c stays under 9 Will check again in 3 months Severe neuropathy---lyrica/gabapentin/narcotics

## 2020-06-18 NOTE — Assessment & Plan Note (Signed)
I have personally reviewed the Medicare Annual Wellness questionnaire and have noted 1. The patient's medical and social history 2. Their use of alcohol, tobacco or illicit drugs 3. Their current medications and supplements 4. The patient's functional ability including ADL's, fall risks, home safety risks and hearing or visual             impairment. 5. Diet and physical activities 6. Evidence for depression or mood disorders  The patients weight, height, BMI and visual acuity have been recorded in the chart I have made referrals, counseling and provided education to the patient based review of the above and I have provided the pt with a written personalized care plan for preventive services.  I have provided you with a copy of your personalized plan for preventive services. Please take the time to review along with your updated medication list.  Family opposes COVID booster---I recommended it Annual flu vaccine Keep walking Consider shingrix

## 2020-06-18 NOTE — Progress Notes (Signed)
Subjective:    Patient ID: [name], male    DOB: June 09, 1931, 87 y.o.   MRN: [id number]  HPI Here with wife for Medicare wellness visit and follow up of chronic health conditions This visit occurred during the SARS-CoV-2 public health emergency.  Safety protocols were in place, including screening questions prior to the visit, additional usage of staff PPE, and extensive cleaning of exam room while observing appropriate contact time as indicated for disinfecting solutions.   Reviewed form and advanced directives Reviewed other doctors No alcohol or tobacco Tries to walk regularly Vision is not good--has macular degeneration with shots in left eye Hearing aides--not wearing them Had just 1 fall--did have injury No depression or anhedonia Wife does all the housework He does all his own ADLs No serious memory issues  Recent fall ER evaluation---did have soft tissue injury but he is better Ongoing back and neuropathy pain Sees Dr [name] for pain---done with ESI (really shot up his blood sugar) May get nerve ablation Still on daily hydrocodone  Heart okay No recent chest pain Chronic stable DOE No PND---just DOE with walking Sleeps in chair--then goes to recliner later Uses oxygen all night and prn  Just saw nephrologist yesterday Blood work done GFR usually in 20's--I can't see the results  Checks sugars twice a week Usually 170's fasting Ongoing neuropathic pain  Current Outpatient Medications on File Prior to Visit  Medication Sig Dispense Refill  . amLODipine (NORVASC) 5 MG tablet Take 5 mg by mouth daily.     [name] aspirin EC 81 MG tablet Take 81 mg by mouth at bedtime.    . Blood Glucose Monitoring Suppl (ONE TOUCH ULTRA 2) w/Device KIT Use to obtain blood sugar daily. Dx Code E11.40 1 kit 0  . carvedilol (COREG) 6.25 MG tablet Take 6.25 mg by mouth 2 (two) times daily.    . clopidogrel (PLAVIX) 75 MG tablet Take 1 tablet (75 mg total) by mouth daily. 90 tablet 3   . DHA-Vitamin C-Lutein (EYE HEALTH FORMULA PO) Take 1 tablet by mouth daily.     [name] glucose blood (ONE TOUCH ULTRA TEST) test strip USE TO CHECK BLOOD SUGAR ONCE A DAY Dx Code E11.40 100 each 3  . HYDROcodone-acetaminophen (NORCO) 10-325 MG tablet Take 1-2 tablets by mouth 2 (two) times daily as needed for severe pain. Must last 30 days. 120 tablet 0  . [START ON 07/11/2020] HYDROcodone-acetaminophen (NORCO) 10-325 MG tablet Take 1-2 tablets by mouth 2 (two) times daily as needed for severe pain. Must last 30 days. 120 tablet 0  . [START ON 08/10/2020] HYDROcodone-acetaminophen (NORCO) 10-325 MG tablet Take 1-2 tablets by mouth 2 (two) times daily as needed for severe pain. Must last 30 days. 120 tablet 0  . losartan (COZAAR) 25 MG tablet Take 25 mg by mouth daily.    . nitroGLYCERIN (NITROSTAT) 0.4 MG SL tablet DISSOLVE 1 TABLET UNDER TONGUE AS NEEDEDFOR CHEST PAIN. MAY REPEAT 5 MINUTES APART 3 TIMES IF NEEDED. IF NO RELIEF CALL 911 25 tablet 0  . OneTouch Delica Lancets 22W MISC 1 each by In Vitro route daily. Dx Code E11.49 100 each 3  . OXYGEN Inhale into the lungs. Per pt- Uses Oxygen 2 liters at night.    . pantoprazole (PROTONIX) 40 MG tablet Take 1 tablet (40 mg total) by mouth daily. 90 tablet 3  . polyethylene glycol (MIRALAX / GLYCOLAX) 17 g packet Take 51 g by mouth every other day.     [name]  pregabalin (LYRICA) 50 MG capsule Take 1 capsule (50 mg total) by mouth at bedtime. 90 capsule 0  . tamsulosin (FLOMAX) 0.4 MG CAPS capsule Take 0.4 mg by mouth at bedtime.     . furosemide (LASIX) 40 MG tablet Take 1 tablet (40 mg total) by mouth every Monday, Wednesday, and Friday. 12 tablet 0   No current facility-administered medications on file prior to visit.    Allergies  Allergen Reactions  . Doxazosin Mesylate Other (See Comments)    dizziness  . Methocarbamol Rash    Past Medical History:  Diagnosis Date  . Arthritis    osteoarthritis, s/p R TKR, and digits  . CAD (coronary artery  disease)    a. s/p CABG (2001)  b. s/p DES to RCA and cutting POBA to ostial PDA (2013)   c. s/p DES to SVG to OM2 (01/14/14) d. cath: 08/2015 NSTEMI w/ patent LIMA-LAD and 99% stenosis of SVG-OM w/ DES placed. CTO of SVG-RCA and SVG-D1.   . Cataract   . Chronic diastolic CHF (congestive heart failure) ([employer])    a) 09/13 ECHO- LVEF 16-10%, grade 1 diastolic dysfunction, mild LA dilatation, atrial septal aneurysm, AV mobility restricted, but no sig AS by doppler; b) 09/04/08 ECHO- LVH, ef 60%, mild AS, c. echo 08/2015: EF perserved of 55-60% with inferolateral HK. Mild AS noted.  . Chronic kidney disease, stage III (moderate) ([place])   . Chronic lower back pain   . Colon polyps   . COVID-19   . Diverticulosis   . Dyspnea 2009 since July -Sept   05/06/08-CPST-  normal effort, reduced VO2 max 20.5 /65%, reduced at 8.2/ 40%, normal breathing resetvca of 55%, submaximal heart rate response 112/77%, flattened o2 pluse response at peak exercise-12 ml/beat @ 85%, No VQ mismatch abnormalities, All c/w CIRC Limitation  . Enlarged prostate   . Esophageal stricture    a. s/p dilation spring 2010  . GERD (gastroesophageal reflux disease)   . Heart murmur   . Hiatal hernia   . History of carpal tunnel syndrome    Bilateral  . History of kidney stones   . History of PFTs    mixed pattern on spiro. mild restn on lung volumes with near normal DLCO. Pattern can be explained by CABG scar. Fev1 2.2L/73%, ratio 68 (67), TLC 4.7/68%,RV 1.5L/55%,DLCO 79%  . Hyperlipidemia   . Hypertension   . Interstitial lung disease ([name])    NOS  . Iron deficiency anemia   . Nausea & vomiting    2018/2019  . On home oxygen therapy    2 L  at bedtime  . Osteoporosis   . Overweight (BMI 25.0-29.9)    BMI 29  . Peripheral neuropathy   . RA (rheumatoid arthritis) ([name])    [name]  . Seropositive rheumatoid arthritis ([place])   . Type II diabetes mellitus ([name])    diet controlled  . Wears glasses   . Wears partial  dentures    upper    Past Surgical History:  Procedure Laterality Date  . BALLOON DILATION N/A 09/12/2018   Procedure: BALLOON DILATION;  Surgeon: Irene Shipper, MD;  Location: [facility];  Service: Endoscopy;  Laterality: N/A;  . BALLOON DILATION N/A 02/20/2020   Procedure: BALLOON DILATION;  Surgeon: Irene Shipper, MD;  Location: [facility];  Service: Endoscopy;  Laterality: N/A;  . BOTOX INJECTION N/A 09/12/2018   Procedure: BOTOX INJECTION;  Surgeon: Irene Shipper, MD;  Location: [facility];  Service: Endoscopy;  Laterality: N/A;  . BOTOX INJECTION N/A 11/27/2018   Procedure: BOTOX INJECTION;  Surgeon: Irene Shipper, MD;  Location: [facility];  Service: Endoscopy;  Laterality: N/A;  . BOTOX INJECTION N/A 02/20/2020   Procedure: BOTOX INJECTION;  Surgeon: Irene Shipper, MD;  Location: [facility];  Service: Endoscopy;  Laterality: N/A;  . CARDIAC CATHETERIZATION  08/2004   CP- no MI, Cath- small vessell disease   . CARDIAC CATHETERIZATION  12/31/2011   80% distal LM, 100% native LAD, LCx and RCA, 30% prox SVG-OM, SVG-D1 normal, 99% distal, 80% ostial SVG-RCA distal to graft, LIMA-LAD normal; LVEF mildly decreased with posterior basal AK   . CARDIAC CATHETERIZATION  2009   with patent grafts/notes 12/31/2011  . CARDIAC CATHETERIZATION N/A 08/13/2015   Procedure: Left Heart Cath and Cors/Grafts Angiography;  Surgeon: Sherren Mocha, MD;  Location: [place] [facility];  Service: Cardiovascular;  Laterality: N/A;  . CATARACT EXTRACTION W/ INTRAOCULAR LENS  IMPLANT, BILATERAL Bilateral   . CHOLECYSTECTOMY OPEN  11/2003   [name]  . CORONARY ANGIOPLASTY WITH STENT PLACEMENT  01/03/2012   Successful DES to SVG-RCA and cutting balloon angioplasty ostial  PDA   . CORONARY ANGIOPLASTY WITH STENT PLACEMENT  01/14/2014   "1"  . CORONARY ARTERY BYPASS GRAFT  11/1999   CABG X5  . CORONARY STENT PLACEMENT  02/2012   1 stent and balloon  . ESOPHAGEAL DILATION  11/27/2018   Procedure: ESOPHAGEAL  DILATION;  Surgeon: Irene Shipper, MD;  Location: [facility];  Service: Endoscopy;;  . ESOPHAGOGASTRODUODENOSCOPY N/A 03/01/2017   Procedure: ESOPHAGOGASTRODUODENOSCOPY (EGD);  Surgeon: Irene Shipper, MD;  Location: [facility];  Service: Endoscopy;  Laterality: N/A;  . ESOPHAGOGASTRODUODENOSCOPY (EGD) WITH ESOPHAGEAL DILATION  2010  . ESOPHAGOGASTRODUODENOSCOPY (EGD) WITH PROPOFOL N/A 09/12/2018   Procedure: ESOPHAGOGASTRODUODENOSCOPY (EGD) WITH PROPOFOL;  Surgeon: Irene Shipper, MD;  Location: [facility];  Service: Endoscopy;  Laterality: N/A;  . ESOPHAGOGASTRODUODENOSCOPY (EGD) WITH PROPOFOL N/A 11/27/2018   Procedure: ESOPHAGOGASTRODUODENOSCOPY (EGD) WITH PROPOFOL, WITH BALLOON DILATION;  Surgeon: Irene Shipper, MD;  Location: [facility];  Service: Endoscopy;  Laterality: N/A;  . ESOPHAGOGASTRODUODENOSCOPY (EGD) WITH PROPOFOL N/A 02/20/2020   Procedure: ESOPHAGOGASTRODUODENOSCOPY (EGD) WITH PROPOFOL;  Surgeon: Irene Shipper, MD;  Location: [facility];  Service: Endoscopy;  Laterality: N/A;  . HAND SURGERY     bilateral carpal tunnel releases  . JOINT REPLACEMENT    . KNEE ARTHROSCOPY Right 2008  . LEFT AND RIGHT HEART CATHETERIZATION WITH CORONARY ANGIOGRAM  12/31/2011   Procedure: LEFT AND RIGHT HEART CATHETERIZATION WITH CORONARY ANGIOGRAM;  Surgeon: Burnell Blanks, MD;  Location[facility] C[facility] LAB;  Service: Cardiovascular;;  . LEFT AND RIGHT HEART CATHETERIZATION WITH CORONARY ANGIOGRAM N/A 01/14/2014   Procedure: LEFT AND RIGHT HEART CATHETERIZATION WITH CORONARY ANGIOGRAM;  Surgeon: Peter M Martinique, MD;  Location: [facility] CATH LAB;  Service: Cardiovascular;  Laterality: N/A;  . MALONEY DILATION  03/01/2017   Procedure: Venia Minks DILATION;  Surgeon: Irene Shipper, MD;  Location: [facility];  Service: Endoscopy;;  . PERCUTANEOUS CORONARY INTERVENTION-BALLOON ONLY  01/03/2012   Procedure: PERCUTANEOUS CORONARY INTERVENTION-BALLOON ONLY;  Surgeon: Peter M Martinique, MD;  Location: [facility] CATH  LAB;  Service: Cardiovascular;;  . PERCUTANEOUS CORONARY STENT INTERVENTION (PCI-S)  12/31/2011   Procedure: PERCUTANEOUS CORONARY STENT INTERVENTION (PCI-S);  Surgeon: Burnell Blanks, MD;  Location: [facility] CATH LAB;  Service: Cardiovascular;;  . PERCUTANEOUS CORONARY STENT INTERVENTION (PCI-S) N/A 01/03/2012   Procedure: PERCUTANEOUS CORONARY STENT INTERVENTION (PCI-S);  Surgeon: Ander Slade  Martinique, MD;  Location: [facility] CATH LAB;  Service: Cardiovascular;  Laterality: N/A;  . SHOULDER ARTHROSCOPY WITH OPEN ROTATOR CUFF REPAIR AND DISTAL CLAVICLE ACROMINECTOMY Left 02/27/2013   Procedure: LEFT SHOULDER ARTHROSCOPY WITH MINI OPEN ROTATOR CUFF REPAIR AND SUBACROMIAL DECOMPRESSION AND DISTAL CLAVICLE RESECTION;  Surgeon: [name], MD;  Location: [place];  Service: Orthopedics;  Laterality: Left;  . TOTAL KNEE ARTHROPLASTY Right 03/2010   [name]  . TRIGGER FINGER RELEASE Left 02/27/2013   Procedure: RELEASE TRIGGER FINGER/A-1 PULLEY;  Surgeon: [name], MD;  Location: [place];  Service: Orthopedics;  Laterality: Left;    Family History  Problem Relation Age of Onset  . COPD Mother   . Heart disease Father   . Heart attack Father   . Stomach cancer Brother   . Stroke Sister   . Alcohol abuse Sister   . Colon cancer Brother 25  . Diabetes Brother   . Rectal cancer Neg Hx     Social History   Socioeconomic History  . Marital status: Married    Spouse name: Not on file  . Number of children: 3  . Years of education: Not on file  . Highest education level: Not on file  Occupational History  . Occupation: Designer, jewellery: RETIRED    Comment: retired  Tobacco Use  . Smoking status: Former Smoker    Packs/day: 1.00    Years: 20.00    Pack years: 20.00    Types: Cigarettes    Quit date: 04/06/1963    Years since quitting: 57.2  . Smokeless tobacco: Former Network engineer  . Vaping Use: Never used  Substance and Sexual Activity  . Alcohol use: No     Alcohol/week: 0.0 standard drinks    Comment: 01/01/2012 "last alcohol ~ 50 yr ago"  . Drug use: No  . Sexual activity: Not Currently  Other Topics Concern  . Not on file  Social History Narrative   No living will   Requests wife as health care POA-- alternate is daughter Hassan Rowan   Discussed DNR --he requests this (done 08/29/12)   Not sure about feeding tube---but might accept for some time   Patient lives with wife and daughter in a one story home.  Has 3 children.  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   Social Determinants of Health   Financial Resource Strain: Low Risk   . Difficulty of Paying Living Expenses: Not very hard  Food Insecurity: Not on file  Transportation Needs: Not on file  Physical Activity: Not on file  Stress: Not on file  Social Connections: Not on file  Intimate Partner Violence: Not on file   Review of Systems Appetite is not good Has lost some weight Sleeps okay Wears seat belt No heartburn on PPI. Still with dysphagia despite dilations by Dr Veto Kemps are fine--no blood No suspicious skin lesions Teeth okay--keeps up with dentist    Objective:   Physical Exam HENT:     Mouth/Throat:     Comments: No lesions Eyes:     Conjunctiva/sclera: Conjunctivae normal.     Pupils: Pupils are equal, round, and reactive to light.  Cardiovascular:     Rate and Rhythm: Normal rate and regular rhythm.     Pulses: Normal pulses.     Heart sounds: No gallop.      Comments: Soft aortic systolic murmur Pulmonary:     Effort: Pulmonary effort is normal.  Comments: Decreased breath sounds Coarse basilar crackles Abdominal:     Palpations: Abdomen is soft.     Tenderness: There is no abdominal tenderness.  Musculoskeletal:     Cervical back: Neck supple.     Right lower leg: No edema.     Left lower leg: No edema.  Lymphadenopathy:     Cervical: No cervical adenopathy.  Skin:    Comments: No foot lesions  Neurological:      Mental Status: He is alert.     Comments: March 2002---no 2022 President--- "Zoila Shutter, Obama" [phone] D-l-o-l-w Recall 3/3  Decreased sensation in feet  Psychiatric:        Mood and Affect: Mood normal.        Behavior: Behavior normal.            Assessment & Plan:

## 2020-06-18 NOTE — Assessment & Plan Note (Signed)
Does get regular injections

## 2020-06-18 NOTE — Assessment & Plan Note (Signed)
Monitored by Dr [name]

## 2020-06-18 NOTE — Assessment & Plan Note (Signed)
Uses oxygen night and PRn Keeps up with Dr Chase Caller

## 2020-06-18 NOTE — Assessment & Plan Note (Signed)
Compensated on furosemide/carvedilol

## 2020-06-18 NOTE — Assessment & Plan Note (Signed)
Had labs yesterday at nephrologist Will try to get results

## 2020-06-18 NOTE — Progress Notes (Signed)
Hearing Screening   125Hz  250Hz  500Hz  1000Hz  2000Hz  3000Hz  4000Hz  6000Hz  8000Hz   Right ear:           Left ear:           Comments: Has hearing aids. Not wearing them today.  Vision Screening Comments: November 2021

## 2020-06-24 ENCOUNTER — Telehealth: Payer: Self-pay | Admitting: *Deleted

## 2020-06-24 NOTE — Telephone Encounter (Signed)
Spoke to pt. He denies CP or SOB. He is asking if he can take Tylenol. I advised him it would be tomorrow before I could get back to him on that.

## 2020-06-24 NOTE — Telephone Encounter (Signed)
Patient called stating that he has questions about symptoms of a heart attack. Patient stated that he has had right arm muscle pain for over a week that comes and goes. Patent stated that the pain started after he picked up his oxygen tank. Patient denies any chest pain, SOB, nausea or left arm pain or tingling. Patient stated that he has to use oxygen at night so he has a history of difficulty breathing. Patient was given 911 and ER precautions. Patient stated that he forgot to mention the arm pain when he was in last week for a visit. Patient wants to know if Dr. Silvio Pate thinks he should come back in or what he would recommend that he do for the pain.

## 2020-06-24 NOTE — Telephone Encounter (Signed)
If it is right arm pain--and from an apparent injury-----no need for visit at this point. Confirm no CP or SOB (or any change from his usual) May just need rest If ongoing pain, will need office evaluation

## 2020-06-25 NOTE — Telephone Encounter (Signed)
Yes---he can take tylenol. Up to 3000mg  a day

## 2020-06-25 NOTE — Telephone Encounter (Signed)
Left detailed message on VM per DPR. 

## 2020-06-26 ENCOUNTER — Other Ambulatory Visit: Payer: Self-pay

## 2020-06-26 ENCOUNTER — Ambulatory Visit: Payer: Medicare Other | Admitting: Rheumatology

## 2020-06-26 ENCOUNTER — Ambulatory Visit: Payer: Self-pay

## 2020-06-26 ENCOUNTER — Encounter: Payer: Self-pay | Admitting: Rheumatology

## 2020-06-26 VITALS — BP 113/73 | HR 64 | Resp 16 | Ht 72.0 in | Wt 193.0 lb

## 2020-06-26 DIAGNOSIS — M1712 Unilateral primary osteoarthritis, left knee: Secondary | ICD-10-CM

## 2020-06-26 DIAGNOSIS — Z8679 Personal history of other diseases of the circulatory system: Secondary | ICD-10-CM | POA: Diagnosis not present

## 2020-06-26 DIAGNOSIS — M25511 Pain in right shoulder: Secondary | ICD-10-CM

## 2020-06-26 DIAGNOSIS — Z8639 Personal history of other endocrine, nutritional and metabolic disease: Secondary | ICD-10-CM | POA: Diagnosis not present

## 2020-06-26 DIAGNOSIS — Z96651 Presence of right artificial knee joint: Secondary | ICD-10-CM | POA: Diagnosis not present

## 2020-06-26 DIAGNOSIS — M059 Rheumatoid arthritis with rheumatoid factor, unspecified: Secondary | ICD-10-CM

## 2020-06-26 DIAGNOSIS — I5032 Chronic diastolic (congestive) heart failure: Secondary | ICD-10-CM | POA: Diagnosis not present

## 2020-06-26 DIAGNOSIS — M47816 Spondylosis without myelopathy or radiculopathy, lumbar region: Secondary | ICD-10-CM

## 2020-06-26 DIAGNOSIS — Z79899 Other long term (current) drug therapy: Secondary | ICD-10-CM

## 2020-06-26 DIAGNOSIS — J849 Interstitial pulmonary disease, unspecified: Secondary | ICD-10-CM | POA: Diagnosis not present

## 2020-06-26 DIAGNOSIS — Z8719 Personal history of other diseases of the digestive system: Secondary | ICD-10-CM

## 2020-06-26 DIAGNOSIS — N184 Chronic kidney disease, stage 4 (severe): Secondary | ICD-10-CM | POA: Diagnosis not present

## 2020-06-26 DIAGNOSIS — Z8669 Personal history of other diseases of the nervous system and sense organs: Secondary | ICD-10-CM

## 2020-06-26 DIAGNOSIS — M79601 Pain in right arm: Secondary | ICD-10-CM

## 2020-06-26 NOTE — Progress Notes (Signed)
Office Visit Note  Patient: [name]             Date of Birth: [birth year]           MRN: [id number]             PCP: [name], MD Referring: [name], MD Visit Date: 06/26/2020 Occupation: @GUAROCC @  Subjective:  Other (Pain in right arm )   History of Present Illness: [name] is a 87 y.o. male with history of seropositive rheumatoid arthritis, ILD and osteoarthritis.  According the patient for the last 2 weeks he has been having pain in his right arm.  He denies any history of injury.  He has some discomfort in his shoulder.  Although he has no difficulty raising his arm.  None of the other joints are painful or swollen.  He denies any increased shortness of breath.  Activities of Daily Living:  Patient reports morning stiffness for 10 minutes.   Patient Denies nocturnal pain.  Difficulty dressing/grooming: Denies Difficulty climbing stairs: Reports Difficulty getting out of chair: Denies Difficulty using hands for taps, buttons, cutlery, and/or writing: Denies  Review of Systems  Constitutional: Positive for fatigue.  HENT: Negative for mouth sores, mouth dryness and nose dryness.   Eyes: Positive for pain and itching. Negative for dryness.  Respiratory: Positive for shortness of breath and difficulty breathing.   Cardiovascular: Negative for chest pain and palpitations.  Gastrointestinal: Negative for blood in stool, constipation and diarrhea.  Endocrine: Negative for increased urination.  Genitourinary: Negative for difficulty urinating.  Musculoskeletal: Positive for morning stiffness. Negative for arthralgias, joint pain, joint swelling, myalgias, muscle tenderness and myalgias.  Skin: Negative for color change, rash and redness.  Allergic/Immunologic: Negative for susceptible to infections.  Neurological: Positive for dizziness and weakness. Negative for numbness, headaches and memory loss.  Hematological: Negative for bruising/bleeding  tendency.  Psychiatric/Behavioral: Negative for confusion.    PMFS History:  Patient Active Problem List   Diagnosis Date Noted  . Macular degeneration, wet ([place]) 06/18/2020  . Pain due to onychomycosis of toenails of both feet 05/22/2020  . Ventricular tachycardia ([place]) 04/23/2020  . Chest pain 04/10/2020  . Chronic narcotic dependence ([place]) 12/14/2019  . Plantar flexed metatarsal bone of left foot 11/08/2019  . COVID-19 virus infection 04/12/2019  . Long term prescription opiate use 12/14/2018  . Neuropathy 12/14/2018  . Problems with swallowing and mastication   . Achalasia   . Right inguinal hernia 07/04/2018  . Advance directive discussed with patient 04/28/2018  . Chronic pain of both lower extremities 10/26/2017  . Neuropathic pain 10/26/2017  . Chronic pain syndrome 10/26/2017  . Iron deficiency anemia 07/05/2017  . Constipation 07/05/2017  . CKD (chronic kidney disease) stage 4, GFR 15-29 ml/min ([name]) 05/17/2017  . Seropositive rheumatoid arthritis ([place]) 05/17/2017  . DM (diabetes mellitus) type II controlled, neurological manifestation ([place]) 05/17/2017  . Atherosclerotic heart disease of native coronary artery with angina pectoris ([place]) 05/17/2017  . Dysphagia 05/17/2017  . High risk medication use 10/21/2016  . Primary osteoarthritis of both knees 10/21/2016  . History of right knee joint replacement 10/21/2016  . Orthostatic hypotension 10/01/2016  . Spinal stenosis of lumbar region without neurogenic claudication 03/23/2016  . Lumbar facet arthropathy 03/23/2016  . Respiratory failure, chronic ([place]) 11/21/2014  . Rheumatoid arthritis ([place]) 11/05/2014  . Rheumatic fever without heart involvement 03/04/2014  . Ventral hernia 12/17/2013  . Routine general medical examination at a health care facility  08/29/2012  . ILD (interstitial lung disease) ([place]) 11/28/2011  . Chronic diastolic heart failure ([place]) 09/14/2011  . Diabetic polyneuropathy associated with type 2  diabetes mellitus ([place]) 08/10/2010  . Obstructive sleep apnea 12/17/2008  . ESOPHAGEAL STRICTURE 10/09/2008  . Esophageal stricture 10/09/2008  . Reflux esophagitis 09/10/2008  . Diverticulosis of colon 09/10/2008  . Gastro-esophageal reflux disease with esophagitis 09/10/2008  . Coronary atherosclerosis 03/19/2008  . Aortic valve disorder 03/19/2008  . Thoracic aorta atherosclerosis ([place]) 03/19/2008  . SLEEP DISORDER, CHRONIC 10/17/2006  . Disturbance in sleep behavior 10/17/2006  . 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 Essential hypertension 09/23/2006  . GERD 09/23/2006  . BENIGN PROSTATIC HYPERTROPHY 09/23/2006  . Enlarged prostate without lower urinary tract symptoms (luts) 09/23/2006  . HLD (hyperlipidemia) 09/23/2006    Past Medical History:  Diagnosis Date  . Arthritis    osteoarthritis, s/p R TKR, and digits  . CAD (coronary artery disease)    a. s/p CABG (2001)  b. s/p DES to RCA and cutting POBA to ostial PDA (2013)   c. s/p DES to SVG to OM2 (01/14/14) d. cath: 08/2015 NSTEMI w/ patent LIMA-[name] and 99% stenosis of SVG-OM w/ DES placed. CTO of SVG-RCA and SVG-D1.   . Cataract   . Chronic diastolic CHF (congestive heart failure) ([place])    a) 09/13 ECHO- LVEF 84-16%, grade 1 diastolic dysfunction, mild LA dilatation, atrial septal aneurysm, AV mobility restricted, but no sig AS by doppler; b) 09/04/08 ECHO- LVH, ef 60%, mild AS, c. echo 08/2015: EF perserved of 55-60% with inferolateral HK. Mild AS noted.  . Chronic kidney disease, stage III (moderate) ([place])   . Chronic lower back pain   . Colon polyps   . COVID-19   . Diverticulosis   . Dyspnea 2009 since July -Sept   05/06/08-CPST-  normal effort, reduced VO2 max 20.5 /65%, reduced at 8.2/ 40%, normal breathing resetvca of 55%, submaximal heart rate response 112/77%, flattened o2 pluse response at peak exercise-12 ml/beat @ 85%, No VQ mismatch abnormalities, All c/w CIRC Limitation  . Enlarged  prostate   . Esophageal stricture    a. s/p dilation spring 2010  . GERD (gastroesophageal reflux disease)   . Heart murmur   . Hiatal hernia   . History of carpal tunnel syndrome    Bilateral  . History of kidney stones   . History of PFTs    mixed pattern on spiro. mild restn on lung volumes with near normal DLCO. Pattern can be explained by CABG scar. Fev1 2.2L/73%, ratio 68 (67), TLC 4.7/68%,RV 1.5L/55%,DLCO 79%  . Hyperlipidemia   . Hypertension   . Interstitial lung disease ([name])    NOS  . Iron deficiency anemia   . Nausea & vomiting    2018/2019  . On home oxygen therapy    2 L [place] at bedtime  . Osteoporosis   . Overweight (BMI 25.0-29.9)    BMI 29  . Peripheral neuropathy   . RA (rheumatoid arthritis) ([name])    [name]  . Seropositive rheumatoid arthritis ([place])   . Type II diabetes mellitus ([name])    diet controlled  . Wears glasses   . Wears partial dentures    upper    Family History  Problem Relation Age of Onset  . COPD Mother   . Heart disease Father   . Heart attack Father   . Stomach cancer Brother   . Stroke Sister   . Alcohol abuse Sister   .  Colon cancer Brother 25  . Diabetes Brother   . Rectal cancer Neg Hx    Past Surgical History:  Procedure Laterality Date  . BALLOON DILATION N/A 09/12/2018   Procedure: BALLOON DILATION;  Surgeon: Irene Shipper, MD;  Location[facility];  Service: Endoscopy;  Laterality: N/A;  . BALLOON DILATION N/A 02/20/2020   Procedure: BALLOON DILATION;  Surgeon: Irene Shipper, MD;  Location: [facility];  Service: Endoscopy;  Laterality: N/A;  . BOTOX INJECTION N/A 09/12/2018   Procedure: BOTOX INJECTION;  Surgeon: Irene Shipper, MD;  Location: [facility];  Service: Endoscopy;  Laterality: N/A;  . BOTOX INJECTION N/A 11/27/2018   Procedure: BOTOX INJECTION;  Surgeon: Irene Shipper, MD;  Location: [facility];  Service: Endoscopy;  Laterality: N/A;  . BOTOX INJECTION N/A 02/20/2020   Procedure: BOTOX INJECTION;   Surgeon: Irene Shipper, MD;  Location: [facility];  Service: Endoscopy;  Laterality: N/A;  . CARDIAC CATHETERIZATION  08/2004   CP- no MI, Cath- small vessell disease   . CARDIAC CATHETERIZATION  12/31/2011   80% distal LM, 100% native [name], LCx and RCA, 30% prox SVG-OM, SVG-D1 normal, 99% distal, 80% ostial SVG-RCA distal to graft, LIMA-[name] normal; LVEF mildly decreased with posterior basal AK   . CARDIAC CATHETERIZATION  2009   with patent grafts/notes 12/31/2011  . CARDIAC CATHETERIZATION N/A 08/13/2015   Procedure: Left Heart Cath and Cors/Grafts Angiography;  Surgeon: Sherren Mocha, MD;  Location: [facility];  Service: Cardiovascular;  Laterality: N/A;  . CATARACT EXTRACTION W/ INTRAOCULAR LENS  IMPLANT, BILATERAL Bilateral   . CHOLECYSTECTOMY OPEN  11/2003   [name]  . CORONARY ANGIOPLASTY WITH STENT PLACEMENT  01/03/2012   Successful DES to SVG-RCA and cutting balloon angioplasty ostial  PDA   . CORONARY ANGIOPLASTY WITH STENT PLACEMENT  01/14/2014   "1"  . CORONARY ARTERY BYPASS GRAFT  11/1999   CABG X5  . CORONARY STENT PLACEMENT  02/2012   1 stent and balloon  . ESOPHAGEAL DILATION  11/27/2018   Procedure: ESOPHAGEAL DILATION;  Surgeon: Irene Shipper, MD;  Location: [facility];  Service: Endoscopy;;  . ESOPHAGOGASTRODUODENOSCOPY N/A 03/01/2017   Procedure: ESOPHAGOGASTRODUODENOSCOPY (EGD);  Surgeon: Irene Shipper, MD;  Location: [facility];  Service: Endoscopy;  Laterality: N/A;  . ESOPHAGOGASTRODUODENOSCOPY (EGD) WITH ESOPHAGEAL DILATION  2010  . ESOPHAGOGASTRODUODENOSCOPY (EGD) WITH PROPOFOL N/A 09/12/2018   Procedure: ESOPHAGOGASTRODUODENOSCOPY (EGD) WITH PROPOFOL;  Surgeon: Irene Shipper, MD;  Location: [facility];  Service: Endoscopy;  Laterality: N/A;  . ESOPHAGOGASTRODUODENOSCOPY (EGD) WITH PROPOFOL N/A 11/27/2018   Procedure: ESOPHAGOGASTRODUODENOSCOPY (EGD) WITH PROPOFOL, WITH BALLOON DILATION;  Surgeon: Irene Shipper, MD;  Location: [facility];  Service:  Endoscopy;  Laterality: N/A;  . ESOPHAGOGASTRODUODENOSCOPY (EGD) WITH PROPOFOL N/A 02/20/2020   Procedure: ESOPHAGOGASTRODUODENOSCOPY (EGD) WITH PROPOFOL;  Surgeon: Irene Shipper, MD;  Location: [facility];  Service: Endoscopy;  Laterality: N/A;  . HAND SURGERY     bilateral carpal tunnel releases  . JOINT REPLACEMENT    . KNEE ARTHROSCOPY Right 2008  . LEFT AND RIGHT HEART CATHETERIZATION WITH CORONARY ANGIOGRAM  12/31/2011   Procedure: LEFT AND RIGHT HEART CATHETERIZATION WITH CORONARY ANGIOGRAM;  Surgeon: Burnell Blanks, MD;  Location: [facility] CATH LAB;  Service: Cardiovascular;;  . LEFT AND RIGHT HEART CATHETERIZATION WITH CORONARY ANGIOGRAM N/A 01/14/2014   Procedure: LEFT AND RIGHT HEART CATHETERIZATION WITH CORONARY ANGIOGRAM;  Surgeon: Peter M Martinique, MD;  Location: [facility] CATH LAB;  Service: Cardiovascular;  Laterality: N/A;  .  MALONEY DILATION  03/01/2017   Procedure: MALONEY DILATION;  Surgeon: Irene Shipper, MD;  Location: [facility];  Service: Endoscopy;;  . PERCUTANEOUS CORONARY INTERVENTION-BALLOON ONLY  01/03/2012   Procedure: PERCUTANEOUS CORONARY INTERVENTION-BALLOON ONLY;  Surgeon: Peter M Martinique, MD;  Location: [facility] CATH LAB;  Service: Cardiovascular;;  . PERCUTANEOUS CORONARY STENT INTERVENTION (PCI-S)  12/31/2011   Procedure: PERCUTANEOUS CORONARY STENT INTERVENTION (PCI-S);  Surgeon: Burnell Blanks, MD;  Location[facility] C[facility];  Service: Cardiovascular;;  . PERCUTANEOUS CORONARY STENT INTERVENTION (PCI-S) N/A 01/03/2012   Procedure: PERCUTANEOUS CORONARY STENT INTERVENTION (PCI-S);  Surgeon: Peter M Martinique, MD;  Location: [facility] CATH LAB;  Service: Cardiovascular;  Laterality: N/A;  . SHOULDER ARTHROSCOPY WITH OPEN ROTATOR CUFF REPAIR AND DISTAL CLAVICLE ACROMINECTOMY Left 02/27/2013   Procedure: LEFT SHOULDER ARTHROSCOPY WITH MINI OPEN ROTATOR CUFF REPAIR AND SUBACROMIAL DECOMPRESSION AND DISTAL CLAVICLE RESECTION;  Surgeon: [name], MD;  Location: [facility];   Service: Orthopedics;  Laterality: Left;  . TOTAL KNEE ARTHROPLASTY Right 03/2010   [name]  . TRIGGER FINGER RELEASE Left 02/27/2013   Procedure: RELEASE TRIGGER FINGER/A-1 PULLEY;  Surgeon: [name], MD;  Location: [facility];  Service: Orthopedics;  Laterality: Left;   Social History   Social History Narrative   No living will   Requests wife as health care POA-- alternate is daughter Hassan Rowan   Discussed DNR --he requests this (done 08/29/12)   Not sure about feeding tube---but might accept for some time   Patient lives with wife and daughter in a one story home.  Has 3 children.  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   Immunization History  Administered Date(s) Administered  . H1N1 03/27/2008  . Influenza Split 12/29/2010, 01/18/2012  . Influenza Whole 02/03/2007, 01/09/2008, 01/01/2009, 12/31/2009  . Influenza,inj,Quad PF,6+ Mos 12/13/2012, 01/15/2014, 01/15/2015, 01/06/2016, 01/03/2017, 02/09/2018, 01/30/2019, 01/05/2020  . PFIZER(Purple Top)SARS-COV-2 Vaccination 04/26/2019, 05/17/2019  . Pneumococcal Conjugate-13 09/10/2013  . Pneumococcal Polysaccharide-23 03/05/1997, 08/17/2011  . Td 03/05/1997, 08/17/2011     Objective: Vital Signs: BP 113/73 (BP Location: Left Arm, Patient Position: Sitting, Cuff Size: Normal)   Pulse 64   Resp 16   Ht 6' (1.829 m)   Wt 193 lb (87.5 kg)   BMI 26.18 kg/m    Physical Exam Vitals and nursing note reviewed.  Constitutional:      Appearance: He is well-developed.  HENT:     Head: Normocephalic and atraumatic.  Eyes:     Conjunctiva/sclera: Conjunctivae normal.     Pupils: Pupils are equal, round, and reactive to light.  Cardiovascular:     Rate and Rhythm: Normal rate and regular rhythm.     Heart sounds: Normal heart sounds.  Pulmonary:     Effort: Pulmonary effort is normal.     Breath sounds: Normal breath sounds.  Abdominal:     General: Bowel sounds are normal.     Palpations: Abdomen is soft.   Musculoskeletal:     Cervical back: Normal range of motion and neck supple.  Skin:    General: Skin is warm and dry.     Capillary Refill: Capillary refill takes less than 2 seconds.  Neurological:     Mental Status: He is alert and oriented to person, place, and time.  Psychiatric:        Behavior: Behavior normal.      Musculoskeletal Exam: C-spine was in good range of motion.  Shoulder joints with good range of motion without discomfort.  He  had tenderness over midshaft of his right humerus.  Elbow joints, wrist joints, MCPs PIPs and DIPs with good range of motion with no synovitis.  He had no discomfort range of motion of his hip joints knee joints ankles or MTPs.  No synovitis was noted on the examination today.  CDAI Exam: CDAI Score: 0.2  Patient Global: 2 mm; Provider Global: 0 mm Swollen: 0 ; Tender: 0  Joint Exam 06/26/2020   No joint exam has been documented for this visit   There is currently no information documented on the homunculus. Go to the Rheumatology activity and complete the homunculus joint exam.  Investigation: No additional findings.  Imaging: XR Humerus Right  Result Date: 06/26/2020 No cortical irregularity was noted.  No soft tissue changes were noted. Impression: Unremarkable x-ray of the humerus.  XR Shoulder Right  Result Date: 06/26/2020 No glenohumeral joint space narrowing was noted.  No chondrocalcinosis was noted.  No acromioclavicular narrowing was noted. Impression: Unremarkable x-ray of the shoulder joint.   Recent Labs: Lab Results  Component Value Date   WBC 9.4 04/30/2020   HGB 15.2 04/30/2020   PLT 195 04/30/2020   NA 139 04/30/2020   K 4.0 04/30/2020   CL 99 04/30/2020   CO2 29 04/30/2020   GLUCOSE 252 (H) 04/30/2020   BUN 38 (H) 04/30/2020   CREATININE 2.11 (H) 04/30/2020   BILITOT 0.8 06/13/2019   ALKPHOS 58 06/13/2019   AST 14 06/13/2019   ALT 9 06/13/2019   PROT 8.0 06/13/2019   ALBUMIN 4.1 06/13/2019   ALBUMIN  4.1 06/13/2019   CALCIUM 9.9 04/30/2020   GFRAA 30 (L) 06/24/2017    Speciality Comments: D/c Imuran and Cellcept in the past due to GI SE  Procedures:  No procedures performed Allergies: Doxazosin mesylate and Methocarbamol   Assessment / Plan:     Visit Diagnoses: Seropositive rheumatoid arthritis ([place]) - his rheumatoid arthritis in remission  ILD (interstitial lung disease) ([place]) - he has history of ILD.  Patient denies any increased shortness of breath.  Right arm pain -he has been having pain and discomfort in the right mid arm for the last 2 weeks.  There is no history of trauma.  He had some tenderness in the mid arm on palpation.  Plan: XR Humerus Right.  X-ray was unremarkable.  He states the pain has been consistent and has not eased off.  I will make a referral to Dr. Durward Fortes.  Acute pain of right shoulder -he has some discomfort on raising his right arm but had full range of motion.  There is no point tenderness over his shoulder joint.  Plan: XR Shoulder Right.  X-ray of the shoulder joint was unremarkable.  High risk medication use - he has taken CellCept and Imuran in the past but discontinued due to side effects.  History of right knee joint replacement-doing well.  Primary osteoarthritis of left knee-he had no discomfort and swelling on my examination.  Spondylosis without myelopathy or radiculopathy, lumbar region-he denies any lower back pain.  Other medical problems are listed as follows:  History of hypertension  Chronic diastolic heart failure ([name])  History of coronary artery disease  History of hyperlipidemia  CKD (chronic kidney disease) stage 4, GFR 15-29 ml/min ([name])  History of diabetes mellitus  History of diverticulosis  History of gastroesophageal reflux (GERD)  History of sleep apnea  Orders: Orders Placed This Encounter  Procedures  . XR Humerus Right  . XR Shoulder Right  .  AMB referral to orthopedics   No orders of the  defined types were placed in this encounter.    Follow-Up Instructions: Return in about 6 months (around 12/27/2020) for Rheumatoid arthritis.   [name], MD  Note - This record has been created using Editor, commissioning.  Chart creation errors have been sought, but may not always  have been located. Such creation errors do not reflect on  the standard of medical care.

## 2020-06-27 NOTE — Telephone Encounter (Signed)
Yes---it would be safe to try the 4% lidocaine patch (and a good option). It is only supposed to be on for 12 hours in any one day though

## 2020-06-27 NOTE — Telephone Encounter (Signed)
Spoke to pt. Advised him what Dr Silvio Pate suggested.

## 2020-06-27 NOTE — Telephone Encounter (Signed)
Pt called and he is still having problem with rt arm; pt saw rheumatologist on 06/26/20 and she did evaluation and xrays and could not tell pt why having arm pain. Pt has appt with Dr Durward Fortes 07/02/20. Pt said tylenol not helping a lot. Pt has lidocaine 4% patches he got OTC and wants to know if Dr Silvio Pate thinks OK for pt to use the lidocaine patch on rt arm. Cardiologist does not want pt to take or use anything that has cortisone in it due to elevating blood sugar. Pt having pain around muscle in upper rt arm on and off. Movement does not change pain level of rt arm. Now pt has pain level of 5 in rt upper arm. No CP or SOB. Pt request cb after Dr Silvio Pate reviews the note.

## 2020-07-02 ENCOUNTER — Ambulatory Visit: Payer: Medicare Other | Admitting: Orthopaedic Surgery

## 2020-07-02 ENCOUNTER — Encounter: Payer: Self-pay | Admitting: Orthopaedic Surgery

## 2020-07-02 ENCOUNTER — Other Ambulatory Visit: Payer: Self-pay

## 2020-07-02 VITALS — Ht 72.0 in | Wt 193.0 lb

## 2020-07-02 DIAGNOSIS — G8929 Other chronic pain: Secondary | ICD-10-CM

## 2020-07-02 DIAGNOSIS — M25511 Pain in right shoulder: Secondary | ICD-10-CM

## 2020-07-02 DIAGNOSIS — M79601 Pain in right arm: Secondary | ICD-10-CM

## 2020-07-02 NOTE — Progress Notes (Signed)
Office Visit Note   Patient: [name]           Date of Birth: [birth year]           MRN: [id number] Visit Date: 07/02/2020              Requested by: [name], MD [address] [place],  [place] [address] PCP: [name] Carbon, MD   Assessment & Plan: Visit Diagnoses:  1. Chronic right shoulder pain   2. Arm pain, right     Plan: [name] has been experiencing right arm pain for approximately a month.  It is hard for him to localize the discomfort but it seems to be in the area of his shoulder and mid arm.  He was seen by [name] who obtained films.  I reviewed these on the PACS system.  There was some bulky changes at the [facility] joint with prominent anterior chromium but I did not see any degenerative changes or acute changes about the glenohumeral joint.  Films of the humerus were negative for any obvious pathology.  He does have limited flexion of both of his shoulders and I suspect the pain may be referred from his shoulder.  He does have's some limited motion of his neck but does not seem to have any referred pain to either upper extremity.  He has had a difficult time with cortisone in the past as it "runs out my blood sugar".  So, I a will try a course of physical therapy and see if it makes a difference and see him back in the next 3 to 4 weeks.  Follow-Up Instructions: Return in about 1 month (around 08/02/2020).   Orders:  Orders Placed This Encounter  Procedures  . Ambulatory referral to Physical Therapy   No orders of the defined types were placed in this encounter.     Procedures: No procedures performed   Clinical Data: No additional findings.   Subjective: Chief Complaint  Patient presents with  . Right Upper Arm - Pain  Patient presents today for right upper arm pain. He said that it began hurting a month ago. No injury. His pain stays in his proximal arm, but occasionally is also in his lower arm. No shoulder pain.  No numbness or tingling. The pain is not constant. He states that it does hurt him at night. His PCP told him to take Tylenol but it does not help. He saw [name] and had x-rays taken.  Films are reviewed on the PACS system and described as above.  HPI  Review of Systems   Objective: Vital Signs: Ht 6' (1.829 m)   Wt 193 lb (87.5 kg)   BMI 26.18 kg/m   Physical Exam Constitutional:      Appearance: He is well-developed.  Eyes:     Pupils: Pupils are equal, round, and reactive to light.  Pulmonary:     Effort: Pulmonary effort is normal.  Skin:    General: Skin is warm and dry.  Neurological:     Mental Status: He is alert and oriented to person, place, and time.  Psychiatric:        Behavior: Behavior normal.     Ortho Exam right shoulder with flexion about 110 120 degrees at that point he was tight.  Also with a similar exam on the left.  Had some slight decreased external rotation on both sides.  He appears to have good strength.  Biceps intact.  Not much tenderness at the [facility] joint or the anterior subacromial region.  Appears to have good strength.  Has some areas of tenderness along the proximal arm but no skin changes.  X-rays were negative. Specialty Comments:  No specialty comments available.  Imaging: No results found.   PMFS History: Patient Active Problem List   Diagnosis Date Noted  . Arm pain, right 07/02/2020  . Macular degeneration, wet ([place]) 06/18/2020  . Pain due to onychomycosis of toenails of both feet 05/22/2020  . Ventricular tachycardia ([place]) 04/23/2020  . Chest pain 04/10/2020  . Chronic narcotic dependence ([place]) 12/14/2019  . Plantar flexed metatarsal bone of left foot 11/08/2019  . COVID-19 virus infection 04/12/2019  . Long term prescription opiate use 12/14/2018  . Neuropathy 12/14/2018  . Problems with swallowing and mastication   . Achalasia   . Right inguinal hernia 07/04/2018  . Advance directive discussed with patient 04/28/2018   . Chronic pain of both lower extremities 10/26/2017  . Neuropathic pain 10/26/2017  . Chronic pain syndrome 10/26/2017  . Iron deficiency anemia 07/05/2017  . Constipation 07/05/2017  . CKD (chronic kidney disease) stage 4, GFR 15-29 ml/min ([name]) 05/17/2017  . Seropositive rheumatoid arthritis ([place]) 05/17/2017  . DM (diabetes mellitus) type II controlled, neurological manifestation ([place]) 05/17/2017  . Atherosclerotic heart disease of native coronary artery with angina pectoris ([place]) 05/17/2017  . Dysphagia 05/17/2017  . High risk medication use 10/21/2016  . Primary osteoarthritis of both knees 10/21/2016  . History of right knee joint replacement 10/21/2016  . Orthostatic hypotension 10/01/2016  . Spinal stenosis of lumbar region without neurogenic claudication 03/23/2016  . Lumbar facet arthropathy 03/23/2016  . Respiratory failure, chronic ([place]) 11/21/2014  . Rheumatoid arthritis ([place]) 11/05/2014  . Rheumatic fever without heart involvement 03/04/2014  . Ventral hernia 12/17/2013  . Routine general medical examination at a health care facility 08/29/2012  . ILD (interstitial lung disease) ([place]) 11/28/2011  . Chronic diastolic heart failure ([place]) 09/14/2011  . Diabetic polyneuropathy associated with type 2 diabetes mellitus ([place]) 08/10/2010  . Obstructive sleep apnea 12/17/2008  . ESOPHAGEAL STRICTURE 10/09/2008  . Esophageal stricture 10/09/2008  . Reflux esophagitis 09/10/2008  . Diverticulosis of colon 09/10/2008  . Gastro-esophageal reflux disease with esophagitis 09/10/2008  . Coronary atherosclerosis 03/19/2008  . Aortic valve disorder 03/19/2008  . Thoracic aorta atherosclerosis ([place]) 03/19/2008  . SLEEP DISORDER, CHRONIC 10/17/2006  . Disturbance in sleep behavior 10/17/2006  . 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 Essential hypertension 09/23/2006  . GERD 09/23/2006  . BENIGN PROSTATIC HYPERTROPHY 09/23/2006  . Enlarged prostate without  lower urinary tract symptoms (luts) 09/23/2006  . HLD (hyperlipidemia) 09/23/2006   Past Medical History:  Diagnosis Date  . Arthritis    osteoarthritis, s/p R TKR, and digits  . CAD (coronary artery disease)    a. s/p CABG (2001)  b. s/p DES to RCA and cutting POBA to ostial PDA (2013)   c. s/p DES to SVG to OM2 (01/14/14) d. cath: 08/2015 NSTEMI w/ patent LIMA-LAD and 99% stenosis of SVG-OM w/ DES placed. CTO of SVG-RCA and SVG-D1.   . Cataract   . Chronic diastolic CHF (congestive heart failure) ([place])    a) 09/13 ECHO- LVEF 63-33%, grade 1 diastolic dysfunction, mild LA dilatation, atrial septal aneurysm, AV mobility restricted, but no sig AS by doppler; b) 09/04/08 ECHO- LVH, ef 60%, mild AS, c. echo 08/2015: EF perserved of 55-60% with inferolateral HK. Mild AS noted.  . Chronic kidney disease, stage  III (moderate) ([place])   . Chronic lower back pain   . Colon polyps   . COVID-19   . Diverticulosis   . Dyspnea 2009 since July -Sept   05/06/08-CPST-  normal effort, reduced VO2 max 20.5 /65%, reduced at 8.2/ 40%, normal breathing resetvca of 55%, submaximal heart rate response 112/77%, flattened o2 pluse response at peak exercise-12 ml/beat @ 85%, No VQ mismatch abnormalities, All c/w CIRC Limitation  . Enlarged prostate   . Esophageal stricture    a. s/p dilation spring 2010  . GERD (gastroesophageal reflux disease)   . Heart murmur   . Hiatal hernia   . History of carpal tunnel syndrome    Bilateral  . History of kidney stones   . History of PFTs    mixed pattern on spiro. mild restn on lung volumes with near normal DLCO. Pattern can be explained by CABG scar. Fev1 2.2L/73%, ratio 68 (67), TLC 4.7/68%,RV 1.5L/55%,DLCO 79%  . Hyperlipidemia   . Hypertension   . Interstitial lung disease ([name])    NOS  . Iron deficiency anemia   . Nausea & vomiting    2018/2019  . On home oxygen therapy    2 L [place] at bedtime  . Osteoporosis   . Overweight (BMI 25.0-29.9)    BMI 29  . Peripheral  neuropathy   . RA (rheumatoid arthritis) ([name])    [name]  . Seropositive rheumatoid arthritis ([place])   . Type II diabetes mellitus ([name])    diet controlled  . Wears glasses   . Wears partial dentures    upper    Family History  Problem Relation Age of Onset  . COPD Mother   . Heart disease Father   . Heart attack Father   . Stomach cancer Brother   . Stroke Sister   . Alcohol abuse Sister   . Colon cancer Brother 25  . Diabetes Brother   . Rectal cancer Neg Hx     Past Surgical History:  Procedure Laterality Date  . BALLOON DILATION N/A 09/12/2018   Procedure: BALLOON DILATION;  Surgeon: Irene Shipper, MD;  Location[facility];  Service: Endoscopy;  Laterality: N/A;  . BALLOON DILATION N/A 02/20/2020   Procedure: BALLOON DILATION;  Surgeon: Irene Shipper, MD;  Location: [facility];  Service: Endoscopy;  Laterality: N/A;  . BOTOX INJECTION N/A 09/12/2018   Procedure: BOTOX INJECTION;  Surgeon: Irene Shipper, MD;  Location: [facility];  Service: Endoscopy;  Laterality: N/A;  . BOTOX INJECTION N/A 11/27/2018   Procedure: BOTOX INJECTION;  Surgeon: Irene Shipper, MD;  Location: [facility];  Service: Endoscopy;  Laterality: N/A;  . BOTOX INJECTION N/A 02/20/2020   Procedure: BOTOX INJECTION;  Surgeon: Irene Shipper, MD;  Location: [facility];  Service: Endoscopy;  Laterality: N/A;  . CARDIAC CATHETERIZATION  08/2004   CP- no MI, Cath- small vessell disease   . CARDIAC CATHETERIZATION  12/31/2011   80% distal LM, 100% native LAD, LCx and RCA, 30% prox SVG-OM, SVG-D1 normal, 99% distal, 80% ostial SVG-RCA distal to graft, LIMA-LAD normal; LVEF mildly decreased with posterior basal AK   . CARDIAC CATHETERIZATION  2009   with patent grafts/notes 12/31/2011  . CARDIAC CATHETERIZATION N/A 08/13/2015   Procedure: Left Heart Cath and Cors/Grafts Angiography;  Surgeon: Sherren Mocha, MD;  Location: [facility];  Service: Cardiovascular;  Laterality: N/A;  . CATARACT  EXTRACTION W/ INTRAOCULAR LENS  IMPLANT, BILATERAL Bilateral   . CHOLECYSTECTOMY OPEN  11/2003   [name]  . CORONARY ANGIOPLASTY WITH STENT PLACEMENT  01/03/2012   Successful DES to SVG-RCA and cutting balloon angioplasty ostial  PDA   . CORONARY ANGIOPLASTY WITH STENT PLACEMENT  01/14/2014   "1"  . CORONARY ARTERY BYPASS GRAFT  11/1999   CABG X5  . CORONARY STENT PLACEMENT  02/2012   1 stent and balloon  . ESOPHAGEAL DILATION  11/27/2018   Procedure: ESOPHAGEAL DILATION;  Surgeon: Irene Shipper, MD;  Location: [facility];  Service: Endoscopy;;  . ESOPHAGOGASTRODUODENOSCOPY N/A 03/01/2017   Procedure: ESOPHAGOGASTRODUODENOSCOPY (EGD);  Surgeon: Irene Shipper, MD;  Location: [facility];  Service: Endoscopy;  Laterality: N/A;  . ESOPHAGOGASTRODUODENOSCOPY (EGD) WITH ESOPHAGEAL DILATION  2010  . ESOPHAGOGASTRODUODENOSCOPY (EGD) WITH PROPOFOL N/A 09/12/2018   Procedure: ESOPHAGOGASTRODUODENOSCOPY (EGD) WITH PROPOFOL;  Surgeon: Irene Shipper, MD;  Location: [facility];  Service: Endoscopy;  Laterality: N/A;  . ESOPHAGOGASTRODUODENOSCOPY (EGD) WITH PROPOFOL N/A 11/27/2018   Procedure: ESOPHAGOGASTRODUODENOSCOPY (EGD) WITH PROPOFOL, WITH BALLOON DILATION;  Surgeon: Irene Shipper, MD;  Location: [facility];  Service: Endoscopy;  Laterality: N/A;  . ESOPHAGOGASTRODUODENOSCOPY (EGD) WITH PROPOFOL N/A 02/20/2020   Procedure: ESOPHAGOGASTRODUODENOSCOPY (EGD) WITH PROPOFOL;  Surgeon: Irene Shipper, MD;  Location: [facility];  Service: Endoscopy;  Laterality: N/A;  . HAND SURGERY     bilateral carpal tunnel releases  . JOINT REPLACEMENT    . KNEE ARTHROSCOPY Right 2008  . LEFT AND RIGHT HEART CATHETERIZATION WITH CORONARY ANGIOGRAM  12/31/2011   Procedure: LEFT AND RIGHT HEART CATHETERIZATION WITH CORONARY ANGIOGRAM;  Surgeon: Burnell Blanks, MD;  Location: [facility] C[facility] LAB;  Service: Cardiovascular;;  . LEFT AND RIGHT HEART CATHETERIZATION WITH CORONARY ANGIOGRAM N/A 01/14/2014   Procedure: LEFT  AND RIGHT HEART CATHETERIZATION WITH CORONARY ANGIOGRAM;  Surgeon: Peter M Martinique, MD;  Location: [facility] CATH LAB;  Service: Cardiovascular;  Laterality: N/A;  . MALONEY DILATION  03/01/2017   Procedure: [name] Minks DILATION;  Surgeon: Irene Shipper, MD;  Location: [facility];  Service: Endoscopy;;  . PERCUTANEOUS CORONARY INTERVENTION-BALLOON ONLY  01/03/2012   Procedure: PERCUTANEOUS CORONARY INTERVENTION-BALLOON ONLY;  Surgeon: Peter M Martinique, MD;  Location[facility] LAB;  Service: Cardiovascular;;  . PERCUTANEOUS CORONARY STENT INTERVENTION (PCI-S)  12/31/2011   Procedure: PERCUTANEOUS CORONARY STENT INTERVENTION (PCI-S);  Surgeon: Burnell Blanks, MD;  Location: [facility] CATH LAB;  Service: Cardiovascular;;  . PERCUTANEOUS CORONARY STENT INTERVENTION (PCI-S) N/A 01/03/2012   Procedure: PERCUTANEOUS CORONARY STENT INTERVENTION (PCI-S);  Surgeon: Peter M Martinique, MD;  Location: [facility] CATH LAB;  Service: Cardiovascular;  Laterality: N/A;  . SHOULDER ARTHROSCOPY WITH OPEN ROTATOR CUFF REPAIR AND DISTAL CLAVICLE ACROMINECTOMY Left 02/27/2013   Procedure: LEFT SHOULDER ARTHROSCOPY WITH MINI OPEN ROTATOR CUFF REPAIR AND SUBACROMIAL DECOMPRESSION AND DISTAL CLAVICLE RESECTION;  Surgeon: [name], MD;  Location: [facility];  Service: Orthopedics;  Laterality: Left;  . TOTAL KNEE ARTHROPLASTY Right 03/2010   [name]  . TRIGGER FINGER RELEASE Left 02/27/2013   Procedure: RELEASE TRIGGER FINGER/A-1 PULLEY;  Surgeon: [name], MD;  Location: [facility];  Service: Orthopedics;  Laterality: Left;   Social History   Occupational History  . Occupation: Designer, jewellery: RETIRED    Comment: retired  Tobacco Use  . Smoking status: Former Smoker    Packs/day: 1.00    Years: 20.00    Pack years: 20.00    Types: Cigarettes    Quit date: 04/06/1963    Years since quitting: 57.2  . Smokeless tobacco:  Former Network engineer  . Vaping Use: Never used  Substance and Sexual Activity  . Alcohol use: No     Alcohol/week: 0.0 standard drinks    Comment: 01/01/2012 "last alcohol ~ 50 yr ago"  . Drug use: No  . Sexual activity: Not Currently

## 2020-07-07 NOTE — Progress Notes (Deleted)
Office Visit Note  Patient: [name]             Date of Birth: 04/29/1931           MRN: [id number]             PCP: [name], MD Referring: [name], MD Visit Date: 07/21/2020 Occupation: @GUAROCC @  Subjective:  No chief complaint on file.   History of Present Illness: [name] is a 87 y.o. male ***   Activities of Daily Living:  Patient reports morning stiffness for *** {minute/hour:19697}.   Patient {ACTIONS;DENIES/REPORTS:21021675::"Denies"} nocturnal pain.  Difficulty dressing/grooming: {ACTIONS;DENIES/REPORTS:21021675::"Denies"} Difficulty climbing stairs: {ACTIONS;DENIES/REPORTS:21021675::"Denies"} Difficulty getting out of chair: {ACTIONS;DENIES/REPORTS:21021675::"Denies"} Difficulty using hands for taps, buttons, cutlery, and/or writing: {ACTIONS;DENIES/REPORTS:21021675::"Denies"}  No Rheumatology ROS completed.   PMFS History:  Patient Active Problem List   Diagnosis Date Noted  . Arm pain, right 07/02/2020  . Macular degeneration, wet ([place]) 06/18/2020  . Pain due to onychomycosis of toenails of both feet 05/22/2020  . Ventricular tachycardia ([place]) 04/23/2020  . Chest pain 04/10/2020  . Chronic narcotic dependence ([place]) 12/14/2019  . Plantar flexed metatarsal bone of left foot 11/08/2019  . COVID-19 virus infection 04/12/2019  . Long term prescription opiate use 12/14/2018  . Neuropathy 12/14/2018  . Problems with swallowing and mastication   . Achalasia   . Right inguinal hernia 07/04/2018  . Advance directive discussed with patient 04/28/2018  . Chronic pain of both lower extremities 10/26/2017  . Neuropathic pain 10/26/2017  . Chronic pain syndrome 10/26/2017  . Iron deficiency anemia 07/05/2017  . Constipation 07/05/2017  . CKD (chronic kidney disease) stage 4, GFR 15-29 ml/min ([name]) 05/17/2017  . Seropositive rheumatoid arthritis ([place]) 05/17/2017  . DM (diabetes mellitus) type II controlled, neurological manifestation  ([place]) 05/17/2017  . Atherosclerotic heart disease of native coronary artery with angina pectoris ([place]) 05/17/2017  . Dysphagia 05/17/2017  . High risk medication use 10/21/2016  . Primary osteoarthritis of both knees 10/21/2016  . History of right knee joint replacement 10/21/2016  . Orthostatic hypotension 10/01/2016  . Spinal stenosis of lumbar region without neurogenic claudication 03/23/2016  . Lumbar facet arthropathy 03/23/2016  . Respiratory failure, chronic ([place]) 11/21/2014  . Rheumatoid arthritis ([place]) 11/05/2014  . Rheumatic fever without heart involvement 03/04/2014  . Ventral hernia 12/17/2013  . Routine general medical examination at a health care facility 08/29/2012  . ILD (interstitial lung disease) ([place]) 11/28/2011  . Chronic diastolic heart failure ([place]) 09/14/2011  . Diabetic polyneuropathy associated with type 2 diabetes mellitus ([place]) 08/10/2010  . Obstructive sleep apnea 12/17/2008  . ESOPHAGEAL STRICTURE 10/09/2008  . Esophageal stricture 10/09/2008  . Reflux esophagitis 09/10/2008  . Diverticulosis of colon 09/10/2008  . Gastro-esophageal reflux disease with esophagitis 09/10/2008  . Coronary atherosclerosis 03/19/2008  . Aortic valve disorder 03/19/2008  . Thoracic aorta atherosclerosis ([place]) 03/19/2008  . SLEEP DISORDER, CHRONIC 10/17/2006  . Disturbance in sleep behavior 10/17/2006  . 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 Essential hypertension 09/23/2006  . GERD 09/23/2006  . BENIGN PROSTATIC HYPERTROPHY 09/23/2006  . Enlarged prostate without lower urinary tract symptoms (luts) 09/23/2006  . HLD (hyperlipidemia) 09/23/2006    Past Medical History:  Diagnosis Date  . Arthritis    osteoarthritis, s/p R TKR, and digits  . CAD (coronary artery disease)    a. s/p CABG (2001)  b. s/p DES to RCA and cutting POBA to ostial PDA (2013)   c. s/p DES to SVG to OM2 (  01/14/14) d. cath: 08/2015 NSTEMI w/ patent LIMA-LAD and 99% stenosis  of SVG-OM w/ DES placed. CTO of SVG-RCA and SVG-D1.   . Cataract   . Chronic diastolic CHF (congestive heart failure) ([place])    a) 09/13 ECHO- LVEF 76-19%, grade 1 diastolic dysfunction, mild LA dilatation, atrial septal aneurysm, AV mobility restricted, but no sig AS by doppler; b) 09/04/08 ECHO- LVH, ef 60%, mild AS, c. echo 08/2015: EF perserved of 55-60% with inferolateral HK. Mild AS noted.  . Chronic kidney disease, stage III (moderate) ([place])   . Chronic lower back pain   . Colon polyps   . COVID-19   . Diverticulosis   . Dyspnea 2009 since July -Sept   05/06/08-CPST-  normal effort, reduced VO2 max 20.5 /65%, reduced at 8.2/ 40%, normal breathing resetvca of 55%, submaximal heart rate response 112/77%, flattened o2 pluse response at peak exercise-12 ml/beat @ 85%, No VQ mismatch abnormalities, All c/w CIRC Limitation  . Enlarged prostate   . Esophageal stricture    a. s/p dilation spring 2010  . GERD (gastroesophageal reflux disease)   . Heart murmur   . Hiatal hernia   . History of carpal tunnel syndrome    Bilateral  . History of kidney stones   . History of PFTs    mixed pattern on spiro. mild restn on lung volumes with near normal DLCO. Pattern can be explained by CABG scar. Fev1 2.2L/73%, ratio 68 (67), TLC 4.7/68%,RV 1.5L/55%,DLCO 79%  . Hyperlipidemia   . Hypertension   . Interstitial lung disease ([name])    NOS  . Iron deficiency anemia   . Nausea & vomiting    2018/2019  . On home oxygen therapy    2 L [place] at bedtime  . Osteoporosis   . Overweight (BMI 25.0-29.9)    BMI 29  . Peripheral neuropathy   . RA (rheumatoid arthritis) ([name])    [name]  . Seropositive rheumatoid arthritis ([place])   . Type II diabetes mellitus ([name])    diet controlled  . Wears glasses   . Wears partial dentures    upper    Family History  Problem Relation Age of Onset  . COPD Mother   . Heart disease Father   . Heart attack Father   . Stomach cancer Brother   . Stroke Sister   .  Alcohol abuse Sister   . Colon cancer Brother 25  . Diabetes Brother   . Rectal cancer Neg Hx    Past Surgical History:  Procedure Laterality Date  . BALLOON DILATION N/A 09/12/2018   Procedure: BALLOON DILATION;  Surgeon: Irene Shipper, MD;  Location: [facility];  Service: Endoscopy;  Laterality: N/A;  . BALLOON DILATION N/A 02/20/2020   Procedure: BALLOON DILATION;  Surgeon: Irene Shipper, MD;  Location: [facility];  Service: Endoscopy;  Laterality: N/A;  . BOTOX INJECTION N/A 09/12/2018   Procedure: BOTOX INJECTION;  Surgeon: Irene Shipper, MD;  Location: [facility];  Service: Endoscopy;  Laterality: N/A;  . BOTOX INJECTION N/A 11/27/2018   Procedure: BOTOX INJECTION;  Surgeon: Irene Shipper, MD;  Location: [facility];  Service: Endoscopy;  Laterality: N/A;  . BOTOX INJECTION N/A 02/20/2020   Procedure: BOTOX INJECTION;  Surgeon: Irene Shipper, MD;  Location: [facility];  Service: Endoscopy;  Laterality: N/A;  . CARDIAC CATHETERIZATION  08/2004   CP- no MI, Cath- small vessell disease   . CARDIAC CATHETERIZATION  12/31/2011   80% distal LM, 100% native  LAD, LCx and RCA, 30% prox SVG-OM, SVG-D1 normal, 99% distal, 80% ostial SVG-RCA distal to graft, LIMA-LAD normal; LVEF mildly decreased with posterior basal AK   . CARDIAC CATHETERIZATION  2009   with patent grafts/notes 12/31/2011  . CARDIAC CATHETERIZATION N/A 08/13/2015   Procedure: Left Heart Cath and Cors/Grafts Angiography;  Surgeon: Sherren Mocha, MD;  Location: [facility];  Service: Cardiovascular;  Laterality: N/A;  . CATARACT EXTRACTION W/ INTRAOCULAR LENS  IMPLANT, BILATERAL Bilateral   . CHOLECYSTECTOMY OPEN  11/2003   [name]  . CORONARY ANGIOPLASTY WITH STENT PLACEMENT  01/03/2012   Successful DES to SVG-RCA and cutting balloon angioplasty ostial  PDA   . CORONARY ANGIOPLASTY WITH STENT PLACEMENT  01/14/2014   "1"  . CORONARY ARTERY BYPASS GRAFT  11/1999   CABG X5  . CORONARY STENT PLACEMENT  02/2012   1  stent and balloon  . ESOPHAGEAL DILATION  11/27/2018   Procedure: ESOPHAGEAL DILATION;  Surgeon: Irene Shipper, MD;  Location: [facility];  Service: Endoscopy;;  . ESOPHAGOGASTRODUODENOSCOPY N/A 03/01/2017   Procedure: ESOPHAGOGASTRODUODENOSCOPY (EGD);  Surgeon: Irene Shipper, MD;  Location: [facility];  Service: Endoscopy;  Laterality: N/A;  . ESOPHAGOGASTRODUODENOSCOPY (EGD) WITH ESOPHAGEAL DILATION  2010  . ESOPHAGOGASTRODUODENOSCOPY (EGD) WITH PROPOFOL N/A 09/12/2018   Procedure: ESOPHAGOGASTRODUODENOSCOPY (EGD) WITH PROPOFOL;  Surgeon: Irene Shipper, MD;  Location: [facility];  Service: Endoscopy;  Laterality: N/A;  . ESOPHAGOGASTRODUODENOSCOPY (EGD) WITH PROPOFOL N/A 11/27/2018   Procedure: ESOPHAGOGASTRODUODENOSCOPY (EGD) WITH PROPOFOL, WITH BALLOON DILATION;  Surgeon: Irene Shipper, MD;  Location: [facility];  Service: Endoscopy;  Laterality: N/A;  . ESOPHAGOGASTRODUODENOSCOPY (EGD) WITH PROPOFOL N/A 02/20/2020   Procedure: ESOPHAGOGASTRODUODENOSCOPY (EGD) WITH PROPOFOL;  Surgeon: Irene Shipper, MD;  Location: [facility];  Service: Endoscopy;  Laterality: N/A;  . HAND SURGERY     bilateral carpal tunnel releases  . JOINT REPLACEMENT    . KNEE ARTHROSCOPY Right 2008  . LEFT AND RIGHT HEART CATHETERIZATION WITH CORONARY ANGIOGRAM  12/31/2011   Procedure: LEFT AND RIGHT HEART CATHETERIZATION WITH CORONARY ANGIOGRAM;  Surgeon: Burnell Blanks, MD;  Location: [facility] C[facility] LAB;  Service: Cardiovascular;;  . LEFT AND RIGHT HEART CATHETERIZATION WITH CORONARY ANGIOGRAM N/A 01/14/2014   Procedure: LEFT AND RIGHT HEART CATHETERIZATION WITH CORONARY ANGIOGRAM;  Surgeon: Peter M Martinique, MD;  Location[facility] C[facility] LAB;  Service: Cardiovascular;  Laterality: N/A;  . MALONEY DILATION  03/01/2017   Procedure: Venia Minks DILATION;  Surgeon: Irene Shipper, MD;  Location: [facility];  Service: Endoscopy;;  . PERCUTANEOUS CORONARY INTERVENTION-BALLOON ONLY  01/03/2012   Procedure: PERCUTANEOUS CORONARY  INTERVENTION-BALLOON ONLY;  Surgeon: Peter M Martinique, MD;  Location: [facility] CATH LAB;  Service: Cardiovascular;;  . PERCUTANEOUS CORONARY STENT INTERVENTION (PCI-S)  12/31/2011   Procedure: PERCUTANEOUS CORONARY STENT INTERVENTION (PCI-S);  Surgeon: Burnell Blanks, MD;  Location: [facility] CATH LAB;  Service: Cardiovascular;;  . PERCUTANEOUS CORONARY STENT INTERVENTION (PCI-S) N/A 01/03/2012   Procedure: PERCUTANEOUS CORONARY STENT INTERVENTION (PCI-S);  Surgeon: Peter M Martinique, MD;  Location[facility] CATH LAB;  Service: Cardiovascular;  Laterality: N/A;  . SHOULDER ARTHROSCOPY WITH OPEN ROTATOR CUFF REPAIR AND DISTAL CLAVICLE ACROMINECTOMY Left 02/27/2013   Procedure: LEFT SHOULDER ARTHROSCOPY WITH MINI OPEN ROTATOR CUFF REPAIR AND SUBACROMIAL DECOMPRESSION AND DISTAL CLAVICLE RESECTION;  Surgeon: [name], MD;  Location: [facility] [place];  Service: Orthopedics;  Laterality: Left;  . TOTAL KNEE ARTHROPLASTY Right 03/2010   [name]  . TRIGGER FINGER RELEASE Left 02/27/2013   Procedure: RELEASE  TRIGGER FINGER/A-1 PULLEY;  Surgeon: [name], MD;  Location: [facility];  Service: Orthopedics;  Laterality: Left;   Social History   Social History Narrative   No living will   Requests wife as health care POA-- alternate is daughter Hassan Rowan   Discussed DNR --he requests this (done 08/29/12)   Not sure about feeding tube---but might accept for some time   Patient lives with wife and daughter in a one story home.  Has 3 children.  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   Immunization History  Administered Date(s) Administered  . H1N1 03/27/2008  . Influenza Split 12/29/2010, 01/18/2012  . Influenza Whole 02/03/2007, 01/09/2008, 01/01/2009, 12/31/2009  . Influenza,inj,Quad PF,6+ Mos 12/13/2012, 01/15/2014, 01/15/2015, 01/06/2016, 01/03/2017, 02/09/2018, 01/30/2019, 01/05/2020  . PFIZER(Purple Top)SARS-COV-2 Vaccination 04/26/2019, 05/17/2019  . Pneumococcal Conjugate-13 09/10/2013  .  Pneumococcal Polysaccharide-23 03/05/1997, 08/17/2011  . Td 03/05/1997, 08/17/2011     Objective: Vital Signs: There were no vitals taken for this visit.   Physical Exam   Musculoskeletal Exam: ***  CDAI Exam: CDAI Score: -- Patient Global: --; Provider Global: -- Swollen: --; Tender: -- Joint Exam 07/21/2020   No joint exam has been documented for this visit   There is currently no information documented on the homunculus. Go to the Rheumatology activity and complete the homunculus joint exam.  Investigation: No additional findings.  Imaging: XR Humerus Right  Result Date: 06/26/2020 No cortical irregularity was noted.  No soft tissue changes were noted. Impression: Unremarkable x-ray of the humerus.  XR Shoulder Right  Result Date: 06/26/2020 No glenohumeral joint space narrowing was noted.  No chondrocalcinosis was noted.  No acromioclavicular narrowing was noted. Impression: Unremarkable x-ray of the shoulder joint.   Recent Labs: Lab Results  Component Value Date   WBC 9.4 04/30/2020   HGB 15.2 04/30/2020   PLT 195 04/30/2020   NA 139 04/30/2020   K 4.0 04/30/2020   CL 99 04/30/2020   CO2 29 04/30/2020   GLUCOSE 252 (H) 04/30/2020   BUN 38 (H) 04/30/2020   CREATININE 2.11 (H) 04/30/2020   BILITOT 0.8 06/13/2019   ALKPHOS 58 06/13/2019   AST 14 06/13/2019   ALT 9 06/13/2019   PROT 8.0 06/13/2019   ALBUMIN 4.1 06/13/2019   ALBUMIN 4.1 06/13/2019   CALCIUM 9.9 04/30/2020   GFRAA 30 (L) 06/24/2017    Speciality Comments: D/c Imuran and Cellcept in the past due to GI SE  Procedures:  No procedures performed Allergies: Doxazosin mesylate and Methocarbamol   Assessment / Plan:     Visit Diagnoses: No diagnosis found.  Orders: No orders of the defined types were placed in this encounter.  No orders of the defined types were placed in this encounter.   Face-to-face time spent with patient was *** minutes. Greater than 50% of time was spent in  counseling and coordination of care.  Follow-Up Instructions: No follow-ups on file.   Earnestine Mealing, CMA  Note - This record has been created using Editor, commissioning.  Chart creation errors have been sought, but may not always  have been located. Such creation errors do not reflect on  the standard of medical care.

## 2020-07-11 ENCOUNTER — Ambulatory Visit: Payer: Medicare Other | Admitting: Rehabilitative and Restorative Service Providers"

## 2020-07-11 ENCOUNTER — Encounter: Payer: Self-pay | Admitting: Rehabilitative and Restorative Service Providers"

## 2020-07-11 ENCOUNTER — Other Ambulatory Visit: Payer: Self-pay

## 2020-07-11 DIAGNOSIS — G8929 Other chronic pain: Secondary | ICD-10-CM | POA: Diagnosis not present

## 2020-07-11 DIAGNOSIS — R293 Abnormal posture: Secondary | ICD-10-CM

## 2020-07-11 DIAGNOSIS — M6281 Muscle weakness (generalized): Secondary | ICD-10-CM | POA: Diagnosis not present

## 2020-07-11 DIAGNOSIS — M25511 Pain in right shoulder: Secondary | ICD-10-CM | POA: Diagnosis not present

## 2020-07-11 NOTE — Patient Instructions (Signed)
Access Code: [email or web address] URL: [email or web address] Date: 07/11/2020 Prepared by: Scot Jun  Exercises Seated Scapular Retraction - 2 x daily - 7 x weekly - 2 sets - 10 reps - 5 hold Standing Isometric Shoulder Extension at Table - 2 x daily - 7 x weekly - 1 sets - 10 reps - 5 hold Seated Thoracic Lumbar Extension - 2 x daily - 7 x weekly - 2 sets - 10 reps - 2 hold Standing Shoulder Posterior Capsule Stretch - 2 x daily - 7 x weekly - 1 sets - 5 reps - 15 hold

## 2020-07-11 NOTE — Therapy (Addendum)
[facility] Physical Therapy [address] [place], [place], [address] Phone: [phone]   Fax:  [phone]  Physical Therapy Evaluation/Discharge  Patient Details  Name: [name] MRN: [id number] Date of Birth: 1931-06-16 Referring Provider (PT): Dr. Durward Fortes   Encounter Date: 07/11/2020   PT End of Session - 07/11/20 1135     Visit Number 1    Number of Visits 8    Date for PT Re-Evaluation 09/05/20    Authorization Type [employer] Medicare    Progress Note Due on Visit 10    PT Start Time 1145    PT Stop Time 1215    PT Time Calculation (min) 30 min    Activity Tolerance Patient tolerated treatment well    Behavior During Therapy [facility] for tasks assessed/performed             Past Medical History:  Diagnosis Date   Arthritis    osteoarthritis, s/p R TKR, and digits   CAD (coronary artery disease)    a. s/p CABG (2001)  b. s/p DES to RCA and cutting POBA to ostial PDA (2013)   c. s/p DES to SVG to OM2 (01/14/14) d. cath: 08/2015 NSTEMI w/ patent LIMA-LAD and 99% stenosis of SVG-OM w/ DES placed. CTO of SVG-RCA and SVG-D1.    Cataract    Chronic diastolic CHF (congestive heart failure) ([place])    a) 09/13 ECHO- LVEF 54-65%, grade 1 diastolic dysfunction, mild LA dilatation, atrial septal aneurysm, AV mobility restricted, but no sig AS by doppler; b) 09/04/08 ECHO- LVH, ef 60%, mild AS, c. echo 08/2015: EF perserved of 55-60% with inferolateral HK. Mild AS noted.   Chronic kidney disease, stage III (moderate) ([name])    Chronic lower back pain    Colon polyps    COVID-19    Diverticulosis    Dyspnea 2009 since July -Sept   05/06/08-CPST-  normal effort, reduced VO2 max 20.5 /65%, reduced at 8.2/ 40%, normal breathing resetvca of 55%, submaximal heart rate response 112/77%, flattened o2 pluse response at peak exercise-12 ml/beat @ 85%, No VQ mismatch abnormalities, All c/w CIRC Limitation   Enlarged prostate    Esophageal stricture    a. s/p dilation spring  2010   GERD (gastroesophageal reflux disease)    Heart murmur    Hiatal hernia    History of carpal tunnel syndrome    Bilateral   History of kidney stones    History of PFTs    mixed pattern on spiro. mild restn on lung volumes with near normal DLCO. Pattern can be explained by CABG scar. Fev1 2.2L/73%, ratio 68 (67), TLC 4.7/68%,RV 1.5L/55%,DLCO 79%   Hyperlipidemia    Hypertension    Interstitial lung disease ([name])    NOS   Iron deficiency anemia    Nausea & vomiting    2018/2019   On home oxygen therapy    2 L [place] at bedtime   Osteoporosis    Overweight (BMI 25.0-29.9)    BMI 29   Peripheral neuropathy    RA (rheumatoid arthritis) ([place])    [name]   Seropositive rheumatoid arthritis ([place])    Type II diabetes mellitus ([place])    diet controlled   Wears glasses    Wears partial dentures    upper    Past Surgical History:  Procedure Laterality Date   BALLOON DILATION N/A 09/12/2018   Procedure: BALLOON DILATION;  Surgeon: Irene Shipper, MD;  Location: [facility];  Service: Endoscopy;  Laterality: N/A;  BALLOON DILATION N/A 02/20/2020   Procedure: BALLOON DILATION;  Surgeon: Irene Shipper, MD;  [facility];  Service: Endoscopy;  Laterality: N/A;   BOTOX INJECTION N/A 09/12/2018   Procedure: BOTOX INJECTION;  Surgeon: Irene Shipper, MD;  [facility]: [facility];  Service: Endoscopy;  Laterality: N/A;   BOTOX INJECTION N/A 11/27/2018   Procedure: BOTOX INJECTION;  Surgeon: Irene Shipper, MD;  [facility]: [facility];  Service: Endoscopy;  Laterality: N/A;   BOTOX INJECTION N/A 02/20/2020   Procedure: BOTOX INJECTION;  Surgeon: Irene Shipper, MD;  [facility];  Service: Endoscopy;  Laterality: N/A;   CARDIAC CATHETERIZATION  08/2004   CP- no MI, Cath- small vessell disease    CARDIAC CATHETERIZATION  12/31/2011   80% distal LM, 100% native LAD, LCx and RCA, 30% prox SVG-OM, SVG-D1 normal, 99% distal, 80% ostial SVG-RCA distal to graft, LIMA-LAD normal;  LVEF mildly decreased with posterior basal AK    CARDIAC CATHETERIZATION  2009   with patent grafts/notes 12/31/2011   CARDIAC CATHETERIZATION N/A 08/13/2015   Procedure: Left Heart Cath and Cors/Grafts Angiography;  Surgeon: Sherren Mocha, MD;  Location: [facility];  Service: Cardiovascular;  Laterality: N/A;   CATARACT EXTRACTION W/ INTRAOCULAR LENS  IMPLANT, BILATERAL Bilateral    CHOLECYSTECTOMY OPEN  11/2003   [name]   CORONARY ANGIOPLASTY WITH STENT PLACEMENT  01/03/2012   Successful DES to SVG-RCA and cutting balloon angioplasty ostial  PDA    CORONARY ANGIOPLASTY WITH STENT PLACEMENT  01/14/2014   "1"   CORONARY ARTERY BYPASS GRAFT  11/1999   CABG X5   CORONARY STENT PLACEMENT  02/2012   1 stent and balloon   ESOPHAGEAL DILATION  11/27/2018   Procedure: ESOPHAGEAL DILATION;  Surgeon: Irene Shipper, MD;  [facility];  Service: Endoscopy;;   ESOPHAGOGASTRODUODENOSCOPY N/A 03/01/2017   Procedure: ESOPHAGOGASTRODUODENOSCOPY (EGD);  Surgeon: Irene Shipper, MD;  [facility];  Service: Endoscopy;  Laterality: N/A;   ESOPHAGOGASTRODUODENOSCOPY (EGD) WITH ESOPHAGEAL DILATION  2010   ESOPHAGOGASTRODUODENOSCOPY (EGD) WITH PROPOFOL N/A 09/12/2018   Procedure: ESOPHAGOGASTRODUODENOSCOPY (EGD) WITH PROPOFOL;  Surgeon: Irene Shipper, MD;  [facility];  Service: Endoscopy;  Laterality: N/A;   ESOPHAGOGASTRODUODENOSCOPY (EGD) WITH PROPOFOL N/A 11/27/2018   Procedure: ESOPHAGOGASTRODUODENOSCOPY (EGD) WITH PROPOFOL, WITH BALLOON DILATION;  Surgeon: Irene Shipper, MD;  [facility];  Service: Endoscopy;  Laterality: N/A;   ESOPHAGOGASTRODUODENOSCOPY (EGD) WITH PROPOFOL N/A 02/20/2020   Procedure: ESOPHAGOGASTRODUODENOSCOPY (EGD) WITH PROPOFOL;  Surgeon: Irene Shipper, MD;  [facility];  Service: Endoscopy;  Laterality: N/A;   HAND SURGERY     bilateral carpal tunnel releases   JOINT REPLACEMENT     KNEE ARTHROSCOPY Right 2008   LEFT AND RIGHT  HEART CATHETERIZATION WITH CORONARY ANGIOGRAM  12/31/2011   Procedure: LEFT AND RIGHT HEART CATHETERIZATION WITH CORONARY ANGIOGRAM;  Surgeon: Burnell Blanks, MD;  Location: [facility] C[facility] LAB;  Service: Cardiovascular;;   LEFT AND RIGHT HEART CATHETERIZATION WITH CORONARY ANGIOGRAM N/A 01/14/2014   Procedure: LEFT AND RIGHT HEART CATHETERIZATION WITH CORONARY ANGIOGRAM;  Surgeon: Peter M Martinique, MD;  Location: [facility] CATH LAB;  Service: Cardiovascular;  Laterality: N/A;   MALONEY DILATION  03/01/2017   Procedure: Venia Minks DILATION;  Surgeon: Irene Shipper, MD;  [facility];  Service: Endoscopy;;   PERCUTANEOUS CORONARY INTERVENTION-BALLOON ONLY  01/03/2012   Procedure: PERCUTANEOUS CORONARY INTERVENTION-BALLOON ONLY;  Surgeon: Peter M Martinique, MD;  Location: [facility] CATH LAB;  Service: Cardiovascular;;  PERCUTANEOUS CORONARY STENT INTERVENTION (PCI-S)  12/31/2011   Procedure: PERCUTANEOUS CORONARY STENT INTERVENTION (PCI-S);  Surgeon: Burnell Blanks, MD;  Location: [facility] CATH LAB;  Service: Cardiovascular;;   PERCUTANEOUS CORONARY STENT INTERVENTION (PCI-S) N/A 01/03/2012   Procedure: PERCUTANEOUS CORONARY STENT INTERVENTION (PCI-S);  Surgeon: Peter M Martinique, MD;  Location: [facility] CATH LAB;  Service: Cardiovascular;  Laterality: N/A;   SHOULDER ARTHROSCOPY WITH OPEN ROTATOR CUFF REPAIR AND DISTAL CLAVICLE ACROMINECTOMY Left 02/27/2013   Procedure: LEFT SHOULDER ARTHROSCOPY WITH MINI OPEN ROTATOR CUFF REPAIR AND SUBACROMIAL DECOMPRESSION AND DISTAL CLAVICLE RESECTION;  Surgeon: [name], MD;  Location: [facility];  Service: Orthopedics;  Laterality: Left;   TOTAL KNEE ARTHROPLASTY Right 03/2010   [name]   TRIGGER FINGER RELEASE Left 02/27/2013   Procedure: RELEASE TRIGGER FINGER/A-1 PULLEY;  Surgeon: [name], MD;  Location: [place];  Service: Orthopedics;  Laterality: Left;    There were no vitals filed for this visit.    Subjective Assessment - 07/11/20 1149     Subjective  Pt. stated Rt shoulder/arm was hurting for bit over a month but has improved this morning.  Pt. indicated insidous onset, no sleeping difficulty.  Reported it was just there, didn't change much c activity.    Pertinent History "long time ago shoulder surgery" with good improvement reported.    Limitations Lifting    Currently in Pain? No/denies    Pain Score 0-No pain   10/10   Pain Location Arm    Pain Orientation Right   upper arm   Pain Descriptors / Indicators Aching;Dull    Pain Type Chronic pain    Pain Onset More than a month ago    Pain Frequency Intermittent    Aggravating Factors  Pt. indicated insidisous consistent symptoms when hurting, lifting arm up sometimes hurt    Pain Relieving Factors OTC medicine, a little improvement                [facility] PT Assessment - 07/11/20 0001       Assessment   Medical Diagnosis Rt shoulder pain    Referring Provider (PT) Dr. Durward Fortes    Onset Date/Surgical Date 06/03/20   insidious onset   Hand Dominance Right      Precautions   Precautions None      Restrictions   Weight Bearing Restrictions No      Balance Screen   Has the patient fallen in the past 6 months Yes    How many times? 1   brused stomach   Has the patient had a decrease in activity level because of a fear of falling?  No    Is the patient reluctant to leave their home because of a fear of falling?  No      Home Environment   Additional Comments Lt side rail c 2 steps to enter house      Prior [place], sleeping.      Observation/Other Assessments   Focus on Therapeutic Outcomes (FOTO)  intake 62 %, predicted 67 %      Posture/Postural Control   Posture/Postural Control Postural limitations    Postural Limitations Rounded Shoulders;Forward head;Increased thoracic kyphosis      ROM / Strength   AROM / PROM / Strength Strength;PROM;AROM      AROM   Overall AROM Comments AROM equal bilateral without symptom complaints including  flexion, abduction, ER c arm at side in sittting, HBB movement.  In general assessment,  bilateral UE elevation limited to approx. 85% of WFL but equal bilateral at this time.    AROM Assessment Site Shoulder    Right/Left Shoulder Left;Right      PROM   Overall PROM Comments No complaints increased c over pressure for UE bilateral    PROM Assessment Site Shoulder    Right/Left Shoulder Left;Right      Strength   Overall Strength Comments Mild discomfort c flexion MMT Rt UE    Strength Assessment Site Shoulder;Elbow;Forearm    Right/Left Shoulder Left;Right    Right Shoulder Flexion 4+/5   15.9, 16 lbs   Right Shoulder ABduction 4+/5   13.3, 15 lbs   Right Shoulder External Rotation 5/5   16.1, 15.9   Left Shoulder Flexion 4+/5   13.7, 13.7 lbs   Left Shoulder ABduction 4/5   8.3, 8.3 lbs   Left Shoulder Internal Rotation 5/5    Left Shoulder External Rotation 5/5   15.7, 17.6 lbs   Right/Left Elbow Left;Right    Right Elbow Flexion 5/5    Right Elbow Extension 5/5    Left Elbow Flexion 5/5    Left Elbow Extension 5/5    Right/Left Forearm Left;Right                        Objective measurements completed on examination: See above findings.       [facility] Adult PT Treatment/Exercise - 07/11/20 0001       Exercises   Exercises Other Exercises    Other Exercises  HEP instruction/performance c cues for techniques, handout provided.  Trial set performed of each for comprehension and symptom assessment.  HEP consisted of thoracic extension in chair, scap retraction, isometric gh ext lower trap activation, posterior capsule cross arm stretch                    PT Education - 07/11/20 1134     Education Details HEP, POC    Person(s) Educated Patient    Methods Explanation;Verbal cues;Handout    Comprehension Returned demonstration;Verbalized understanding              PT Short Term Goals - 07/11/20 1135       PT SHORT TERM GOAL #1   Title  Patient will demonstrate independent use of home exercise program to maintain progress from in clinic treatments.    Time 3    Period Weeks    Status New    Target Date 08/01/20               PT Long Term Goals - 07/11/20 1217       PT LONG TERM GOAL #1   Title Patient will demonstrate/report pain at worst less than or equal to 2/10 to facilitate minimal limitation in daily activity secondary to pain symptoms.    Time 8    Period Weeks    Status New    Target Date 09/05/20      PT LONG TERM GOAL #2   Title Patient will demonstrate independent use of home exercise program to facilitate ability to maintain/progress functional gains from skilled physical therapy services.    Time 8    Period Weeks    Status New    Target Date 09/05/20      PT LONG TERM GOAL #3   Title Pt. will demonstrate Rt UE MMT 5/5 s symptoms to facilitate usual lifting, carrying, yard work at [employer].  Time 8    Period Weeks    Status New    Target Date 09/05/20      PT LONG TERM GOAL #4   Title Pt. will demonstrate FOTO outcome > or = 67 to indicated reduced disability due to condition.    Time 8    Period Weeks    Status New    Target Date 09/05/20                    Plan - 07/11/20 1135     Clinical Impression Statement Patient is a 87 y.o. who comes to clinic with complaints of Rt shoulder pain with mobility, strength and movement coordination deficits that impair their ability to perform usual daily and recreational functional activities without increase difficulty/symptoms at this time.  Patient to benefit from skilled PT services to address impairments and limitations to improve to previous level of function without restriction secondary to condition.    Personal Factors and Comorbidities Comorbidity 3+    Comorbidities KIdney disease, CHF, hyperlipidemia, osteoporosis, Type 2 DM, HTN, CAD    Examination-Activity Limitations Lift;Reach Overhead;Carry    Examination-Participation  Restrictions Yard Work    Stability/Clinical Decision Making Stable/Uncomplicated    Clinical Decision Making Low    Rehab Potential Fair    PT Frequency 1x / week   1-2x/week   PT Duration 8 weeks    PT Treatment/Interventions ADLs/Self Care Home Management;Cryotherapy;Electrical Stimulation;Moist Heat;Traction;Balance training;Therapeutic exercise;Therapeutic activities;Iontophoresis 4mg /ml Dexamethasone;Functional mobility training;Stair training;Gait training;DME Instruction;Ultrasound;Neuromuscular re-education;Patient/family education;Passive range of motion;Joint Manipulations;Dry needling;Taping;Vasopneumatic Device;Manual techniques    PT Next Visit Plan Review knowledge of HEP, possible d/c if symptoms better.    PT [place] and Agree with Plan of Care Patient             Patient will benefit from skilled therapeutic intervention in order to improve the following deficits and impairments:  Hypomobility,Pain,Impaired UE functional use,Decreased strength,Decreased activity tolerance,Decreased mobility,Impaired perceived functional ability,Improper body mechanics,Postural dysfunction,Decreased range of motion  Visit Diagnosis: Chronic right shoulder pain  Muscle weakness (generalized)  Abnormal posture     Problem List Patient Active Problem List   Diagnosis Date Noted   Arm pain, right 07/02/2020   Macular degeneration, wet ([place]) 06/18/2020   Pain due to onychomycosis of toenails of both feet 05/22/2020   Ventricular tachycardia ([place]) 04/23/2020   Chest pain 04/10/2020   Chronic narcotic dependence ([place]) 12/14/2019   Plantar flexed metatarsal bone of left foot 11/08/2019   COVID-19 virus infection 04/12/2019   Long term prescription opiate use 12/14/2018   Neuropathy 12/14/2018   Problems with swallowing and mastication    Achalasia    Right inguinal hernia 07/04/2018   Advance directive discussed with patient 04/28/2018   Chronic  pain of both lower extremities 10/26/2017   Neuropathic pain 10/26/2017   Chronic pain syndrome 10/26/2017   Iron deficiency anemia 07/05/2017   Constipation 07/05/2017   CKD (chronic kidney disease) stage 4, GFR 15-29 ml/min ([name]) 05/17/2017   Seropositive rheumatoid arthritis ([place]) 05/17/2017   DM (diabetes mellitus) type II controlled, neurological manifestation ([place]) 05/17/2017   Atherosclerotic heart disease of native coronary artery with angina pectoris ([place]) 05/17/2017   Dysphagia 05/17/2017   High risk medication use 10/21/2016   Primary osteoarthritis of both knees 10/21/2016   History of right knee joint replacement 10/21/2016   Orthostatic hypotension 10/01/2016   Spinal stenosis of lumbar region without neurogenic claudication 03/23/2016   Lumbar facet  arthropathy 03/23/2016   Respiratory failure, chronic ([place]) 11/21/2014   Rheumatoid arthritis ([place]) 11/05/2014   Rheumatic fever without heart involvement 03/04/2014   Ventral hernia 12/17/2013   Routine general medical examination at a health care facility 08/29/2012   ILD (interstitial lung disease) ([place]) 11/28/2011   Chronic diastolic heart failure ([place]) 09/14/2011   Diabetic polyneuropathy associated with type 2 diabetes mellitus ([place]) 08/10/2010   Obstructive sleep apnea 12/17/2008   ESOPHAGEAL STRICTURE 10/09/2008   Esophageal stricture 10/09/2008   Reflux esophagitis 09/10/2008   Diverticulosis of colon 09/10/2008   Gastro-esophageal reflux disease with esophagitis 09/10/2008   Coronary atherosclerosis 03/19/2008   Aortic valve disorder 03/19/2008   Thoracic aorta atherosclerosis ([place]) 03/19/2008   SLEEP DISORDER, CHRONIC 10/17/2006   Disturbance in sleep behavior 10/17/2006   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   Essential hypertension 09/23/2006   GERD 09/23/2006   BENIGN PROSTATIC HYPERTROPHY 09/23/2006   Enlarged prostate without lower urinary tract symptoms (luts) 09/23/2006   HLD  (hyperlipidemia) 09/23/2006    Scot Jun, PT, DPT, OCS, ATC 07/11/20  12:20 PM  PHYSICAL THERAPY DISCHARGE SUMMARY  Visits from Start of Care: 1  Current functional level related to goals / functional outcomes: See note   Remaining deficits: See note   Education / Equipment: HEP   Patient agrees to discharge. Patient goals were not met. Patient is being discharged due to not returning since the last visit.  Scot Jun, PT, DPT, OCS, ATC 09/16/20  3:48 PM       [facility] [address] [place], [place], [address] Phone: [phone]   Fax:  [phone]  Name: [name] MRN: [id number] Date of Birth: 01/29/1932

## 2020-07-16 DIAGNOSIS — R0689 Other abnormalities of breathing: Secondary | ICD-10-CM | POA: Diagnosis not present

## 2020-07-16 DIAGNOSIS — J8410 Pulmonary fibrosis, unspecified: Secondary | ICD-10-CM | POA: Diagnosis not present

## 2020-07-18 ENCOUNTER — Telehealth: Payer: Self-pay

## 2020-07-18 NOTE — Chronic Care Management (AMB) (Addendum)
Chronic Care Management Pharmacy Assistant   Name: [name]  MRN: [id number] DOB: 1931/04/19  Reason for Encounter - Reminder Call   Recent office visits:  06/18/2020  Dr Viviana Simpler, PCP  Recent consult visits:  07/02/2020  Dr.Peter Durward Fortes, Orthopedics 06/26/2020  Dr.Shaili Deveshwar, Rheumatology 02/20/2020  [place], [place], Endoscopy    Hospital visits:  04/10/20-04/13/20 - Admitted to the hospital due to chest pain   04/30/2020  [name] Visit  Medications: Outpatient Encounter Medications as of 07/18/2020  Medication Sig   amLODipine (NORVASC) 5 MG tablet Take 5 mg by mouth daily.    aspirin EC 81 MG tablet Take 81 mg by mouth at bedtime.   Blood Glucose Monitoring Suppl (ONE TOUCH ULTRA 2) w/Device KIT Use to obtain blood sugar daily. Dx Code E11.40   carvedilol (COREG) 6.25 MG tablet Take 6.25 mg by mouth 2 (two) times daily.   clopidogrel (PLAVIX) 75 MG tablet Take 1 tablet (75 mg total) by mouth daily.   DHA-Vitamin C-Lutein (EYE HEALTH FORMULA PO) Take 1 tablet by mouth daily.    furosemide (LASIX) 40 MG tablet Take 1 tablet (40 mg total) by mouth every Monday, Wednesday, and Friday.   glucose blood (ONE TOUCH ULTRA TEST) test strip USE TO CHECK BLOOD SUGAR ONCE A DAY Dx Code E11.40   HYDROcodone-acetaminophen (NORCO) 10-325 MG tablet Take 1-2 tablets by mouth 2 (two) times daily as needed for severe pain. Must last 30 days.   [START ON 08/10/2020] HYDROcodone-acetaminophen (NORCO) 10-325 MG tablet Take 1-2 tablets by mouth 2 (two) times daily as needed for severe pain. Must last 30 days.   losartan (COZAAR) 25 MG tablet Take 25 mg by mouth daily.   nitroGLYCERIN (NITROSTAT) 0.4 MG SL tablet DISSOLVE 1 TABLET UNDER TONGUE AS NEEDEDFOR CHEST PAIN. MAY REPEAT 5 MINUTES APART 3 TIMES IF NEEDED. IF NO RELIEF CALL 629   OneTouch Delica Lancets 47M MISC 1 each by In Vitro route daily. Dx Code E11.49   OXYGEN Inhale into the lungs. Per pt- Uses Oxygen 2  liters at night.   polyethylene glycol (MIRALAX / GLYCOLAX) 17 g packet Take 51 g by mouth every other day.    pregabalin (LYRICA) 50 MG capsule Take 1 capsule (50 mg total) by mouth at bedtime.   tamsulosin (FLOMAX) 0.4 MG CAPS capsule Take 0.4 mg by mouth at bedtime.    No facility-administered encounter medications on file as of 07/18/2020.    [name] was contacted to remind him of his upcoming telephone visit with [name] on 07/22/2020 at 10:30 am.  he was reminded to have all medications, supplements and any blood glucose and blood pressure readings available for review at appointment. Left voicemail for patient.   Star Rating Drugs: Medication:  Last Fill: Day Supply Losartan 67m. 04/24/2020 90ds   MDebbora Dus CPP notified  VAvel Sensor CHamiltonAssistant [phone] I have reviewed the care management and care coordination activities outlined in this encounter and I am certifying that I agree with the content of this note. No further action required.  MDebbora Dus PharmD Clinical Pharmacist [facility]Bob[facility]Primary [facility] [place] [phone]

## 2020-07-21 ENCOUNTER — Ambulatory Visit: Payer: Medicare Other | Admitting: Rheumatology

## 2020-07-22 ENCOUNTER — Other Ambulatory Visit: Payer: Self-pay

## 2020-07-22 ENCOUNTER — Ambulatory Visit (INDEPENDENT_AMBULATORY_CARE_PROVIDER_SITE_OTHER): Payer: Medicare Other

## 2020-07-22 DIAGNOSIS — I10 Essential (primary) hypertension: Secondary | ICD-10-CM

## 2020-07-22 DIAGNOSIS — E1140 Type 2 diabetes mellitus with diabetic neuropathy, unspecified: Secondary | ICD-10-CM

## 2020-07-22 NOTE — Progress Notes (Addendum)
Chronic Care Management Pharmacy Note  07/22/2020 Name:  [name] MRN:  [id number] DOB:  [birth year]  Subjective: [name] is an 87 y.o. year old male who is a primary patient of [name], Theophilus Kinds, MD.  The CCM team was consulted for assistance with disease management and care coordination needs.    Engaged with patient by telephone for follow up visit in response to provider referral for pharmacy case management and/or care coordination services.   Consent to Services:  The patient was given information about Chronic Care Management services, agreed to services, and gave verbal consent prior to initiation of services.  Please see initial visit note for detailed documentation.   Patient Care Team: [name], MD as PCP - General [name], MD as PCP - Cardiology (Cardiology) [name], [facility] as Pharmacist (Pharmacist)  Recent office visits:  06/18/2020 - Dr Viviana Simpler  PCP - Continue current medications 04/23/2020 -  Dr Viviana Simpler  PCP - Hospital follow up, chest pain after steroid injection, elevated troponins, sugars 600s, BP elevated, carvedilol increased, continue current medicatons  12/14/2019 - Dr Viviana Simpler  PCP - Try low dose semaglutide--stop if GI intolerance  Recent consult visits:  07/02/2020  Dr.Peter Durward Fortes, Orthopedics 06/26/2020  Dr.Shaili Deveshwar, Rheumatology 06/11/2020 - Dr. [name], Pain management  05/22/2020 - Podiatry 04/15/2020 Cardiology - Hospital follow up, Losartan d/c in hospital due to Scr 2, pt doing better, chest pain appeared to be noncardiac.  02/20/2020 Dr.John Henrene Pastor, Endoscopy  Hospital visits: 04/30/2020  ED Visit - Fall 04/11/2020 - Hospital admission - precordial pain, Losartan held due to AKI,   Objective:  Lab Results  Component Value Date   CREATININE 2.11 (H) 04/30/2020   BUN 38 (H) 04/30/2020   GFR 30.78 (L) 06/13/2019   GFRNONAA 30 (L) 04/30/2020   GFRAA 30 (L) 06/24/2017   NA 139 04/30/2020    K 4.0 04/30/2020   CALCIUM 9.9 04/30/2020   CO2 29 04/30/2020   GLUCOSE 252 (H) 04/30/2020    Lab Results  Component Value Date/Time   HGBA1C 8.9 (H) 04/11/2020 03:17 AM   HGBA1C 8.1 (A) 12/14/2019 12:17 PM   HGBA1C 8.0 (H) 06/13/2019 10:57 AM   GFR 30.78 (L) 06/13/2019 10:57 AM   GFR 28.02 (L) 04/28/2018 12:28 PM   MICROALBUR 7.5 (H) 04/28/2018 12:28 PM   MICROALBUR 0.5 02/04/2009 10:09 AM    Last diabetic Eye exam:  Lab Results  Component Value Date/Time   HMDIABEYEEXA No Retinopathy 02/14/2020 12:00 AM    Last diabetic Foot exam: Podiatry 05/2020   Lab Results  Component Value Date   CHOL 209 (H) 06/13/2019   HDL 35.50 (L) 06/13/2019   LDLCALC 43 10/14/2016   LDLDIRECT 125.0 06/13/2019   TRIG 275.0 (H) 06/13/2019   CHOLHDL 6 06/13/2019    Hepatic Function Latest Ref Rng & Units 06/13/2019 06/13/2019 04/28/2018  Total Protein 6.0 - 8.3 g/dL - 8.0 8.2  Albumin 3.5 - 5.2 g/dL 4.1 4.1 4.1  AST 0 - 37 U/L - 14 17  ALT 0 - 53 U/L - 9 14  Alk Phosphatase 39 - 117 U/L - 58 58  Total Bilirubin 0.2 - 1.2 mg/dL - 0.8 0.6  Bilirubin, Direct 0.0 - 0.3 mg/dL - 0.1 0.0    Lab Results  Component Value Date/Time   TSH 2.100 04/08/2017 09:52 AM   TSH 2.51 08/17/2011 09:37 AM    CBC Latest Ref Rng & Units 04/30/2020 04/12/2020 04/11/2020  WBC  4.0 - 10.5 K/uL 9.4 10.4 20.3(H)  Hemoglobin 13.0 - 17.0 g/dL 15.2 14.1 14.1  Hematocrit 39.0 - 52.0 % 45.0 41.8 43.2  Platelets 150 - 400 K/uL 195 182 195   No results found for: VD25OH  Clinical ASCVD: Yes  The ASCVD Risk score Mikey Bussing DC Jr., et al., 2013) failed to calculate for the following reasons:   The 2013 ASCVD risk score is only valid for ages 39 to 77   The patient has a prior MI or stroke diagnosis    Depression screen [facility] 2/9 06/18/2020 06/11/2020 04/09/2020  Decreased Interest 0 0 0  Down, Depressed, Hopeless 0 0 0  PHQ - 2 Score 0 0 0  Some recent data might be hidden   Social History   Tobacco Use  Smoking Status  Former Smoker  . Packs/day: 1.00  . Years: 20.00  . Pack years: 20.00  . Types: Cigarettes  . Quit date: 04/06/1963  . Years since quitting: 57.3  Smokeless Tobacco Former User   BP Readings from Last 3 Encounters:  06/26/20 113/73  06/18/20 98/62  06/11/20 133/77   Pulse Readings from Last 3 Encounters:  06/26/20 64  06/18/20 61  06/11/20 66   Wt Readings from Last 3 Encounters:  07/02/20 193 lb (87.5 kg)  06/26/20 193 lb (87.5 kg)  06/18/20 195 lb (88.5 kg)   BMI Readings from Last 3 Encounters:  07/02/20 26.18 kg/m  06/26/20 26.18 kg/m  06/18/20 26.45 kg/m    Assessment/Interventions: Review of patient past medical history, allergies, medications, health status, including review of consultants reports, laboratory and other test data, was performed as part of comprehensive evaluation and provision of chronic care management services.   SDOH:  (Social Determinants of Health) assessments and interventions performed: Yes SDOH Interventions   Flowsheet Row Most Recent Value  SDOH Interventions   Financial Strain Interventions Intervention Not Indicated  [Medications affordable]     SDOH Screenings   Alcohol Screen: Not on file  Depression (PHQ2-9): Low Risk   . PHQ-2 Score: 0  Financial Resource Strain: Low Risk   . Difficulty of Paying Living Expenses: Not very hard  Food Insecurity: Not on file  Housing: Not on file  Physical Activity: Not on file  Social Connections: Not on file  Stress: Not on file  Tobacco Use: Medium Risk  . Smoking Tobacco Use: Former Smoker  . Smokeless Tobacco Use: Former Soil scientist Needs: Not on file    [place]  Allergies  Allergen Reactions  . Doxazosin Mesylate Other (See Comments)    dizziness  . Methocarbamol Rash    Medications Reviewed Today    Reviewed by [name], [facility] (Pharmacist) on 07/25/20 at [place] List Status: <None>  Medication Order Taking? Sig Documenting Provider Last Dose Status  Informant  amLODipine (NORVASC) 5 MG tablet [id number] Yes Take 5 mg by mouth daily.  [provider] Taking Active Self  aspirin EC 81 MG tablet [id number] Yes Take 81 mg by mouth at bedtime. [provider] Taking Active Self           Med Note [place] Crane, MISTY D   Thu Apr 10, 2020 11:34 PM)    Blood Glucose Monitoring Suppl (ONE TOUCH ULTRA 2) w/Device KIT [id number] Yes Use to obtain blood sugar daily. Dx Code V40.08 [name], MD Taking Active Self  carvedilol (COREG) 6.25 MG tablet [id number] Yes Take 6.25 mg by mouth 2 (two) times daily. [provider]  Taking Active Self  clopidogrel (PLAVIX) 75 MG tablet [id number] Yes Take 1 tablet (75 mg total) by mouth daily. Burtis Junes, NP Taking Active Self           Med Note [place] Crane, MISTY D   Thu Apr 10, 2020 11:31 PM)    DHA-Vitamin C-Lutein (EYE HEALTH FORMULA PO) [id number] Yes Take 1 tablet by mouth daily.  [provider] Taking Active Self  furosemide (LASIX) 40 MG tablet [id number] Yes Take 1 tablet (40 mg total) by mouth every Monday, Wednesday, and Friday. Irene Pap N, DO Taking Expired 05/14/20 2359   glucose blood (ONE TOUCH ULTRA TEST) test strip [id number] Yes USE TO CHECK BLOOD SUGAR ONCE A DAY Dx Code E11.40 [name], MD Taking Active Self  HYDROcodone-acetaminophen (NORCO) 10-325 MG tablet [id number] Yes Take 1-2 tablets by mouth 2 (two) times daily as needed for severe pain. Must last 30 days. Gillis Santa, MD Taking Active   HYDROcodone-acetaminophen [facility]) 10-325 MG tablet [id number] Yes Take 1-2 tablets by mouth 2 (two) times daily as needed for severe pain. Must last 30 days. Gillis Santa, MD Taking Active   losartan (COZAAR) 25 MG tablet [id number]  Take 25 mg by mouth daily. [provider]  Active   nitroGLYCERIN (NITROSTAT) 0.4 MG SL tablet [id number]  DISSOLVE 1 TABLET UNDER TONGUE AS NEEDEDFOR CHEST PAIN. MAY REPEAT 5 MINUTES APART 3 TIMES IF NEEDED. IF NO RELIEF CALL 911  [name], MD  Active   OneTouch Delica Lancets 87F MISC [id number]  1 each by In Vitro route daily. Dx Code A41.66 [name], MD  Active Self  OXYGEN [id number]  Inhale into the lungs. Per pt- Uses Oxygen 2 liters at night. [provider]  Active Self  pantoprazole (PROTONIX) 40 MG tablet [id number]  TAKE 1 TABLET BY MOUTH TWICE (2) DAILY [name], Richard I, MD  Active   polyethylene glycol (MIRALAX / GLYCOLAX) 17 g packet [id number]  Take 51 g by mouth every other day.  [provider]  Active Self           Med Note Juanda Crumble R   Tue Jan 22, 2020  8:16 AM)    pregabalin (LYRICA) 50 MG capsule [id number]  Take 1 capsule (50 mg total) by mouth at bedtime. Gillis Santa, MD  Active   Semaglutide (RYBELSUS) 3 MG TABS [id number]  Take 1 tablet by mouth daily. [name], MD  Active   tamsulosin (FLOMAX) 0.4 MG CAPS capsule [id number]  Take 0.4 mg by mouth at bedtime.  [provider]  Active Self  Med List Note [name], RN 06/11/20 1408): Med agreement signed 03-17-2017 UDS 10/26/17 MR 09-09-2020 09-19-19 PA request for Hydrocodone Bitartrate submitted for [facility]  Key [id number]  Tennis Must 11-19-2019  PA for hydrocodone Bitartrate ER 10 mg sent by [place]  kt           Patient Active Problem List   Diagnosis Date Noted  . Arm pain, right 07/02/2020  . Macular degeneration, wet ([place]) 06/18/2020  . Pain due to onychomycosis of toenails of both feet 05/22/2020  . Ventricular tachycardia ([place]) 04/23/2020  . Chest pain 04/10/2020  . Chronic narcotic dependence ([place]) 12/14/2019  . Plantar flexed metatarsal bone of left foot 11/08/2019  . COVID-19 virus infection 04/12/2019  . Long term prescription opiate use 12/14/2018  . Neuropathy 12/14/2018  . Problems with swallowing and mastication   . Achalasia   . Right  inguinal hernia 07/04/2018  . Advance directive discussed with patient 04/28/2018  . Chronic pain of both lower extremities  10/26/2017  . Neuropathic pain 10/26/2017  . Chronic pain syndrome 10/26/2017  . Iron deficiency anemia 07/05/2017  . Constipation 07/05/2017  . CKD (chronic kidney disease) stage 4, GFR 15-29 ml/min ([name]) 05/17/2017  . Seropositive rheumatoid arthritis ([place]) 05/17/2017  . DM (diabetes mellitus) type II controlled, neurological manifestation ([place]) 05/17/2017  . Atherosclerotic heart disease of native coronary artery with angina pectoris ([place]) 05/17/2017  . Dysphagia 05/17/2017  . High risk medication use 10/21/2016  . Primary osteoarthritis of both knees 10/21/2016  . History of right knee joint replacement 10/21/2016  . Orthostatic hypotension 10/01/2016  . Spinal stenosis of lumbar region without neurogenic claudication 03/23/2016  . Lumbar facet arthropathy 03/23/2016  . Respiratory failure, chronic ([place]) 11/21/2014  . Rheumatoid arthritis ([place]) 11/05/2014  . Rheumatic fever without heart involvement 03/04/2014  . Ventral hernia 12/17/2013  . Routine general medical examination at a health care facility 08/29/2012  . ILD (interstitial lung disease) ([place]) 11/28/2011  . Chronic diastolic heart failure ([place]) 09/14/2011  . Diabetic polyneuropathy associated with type 2 diabetes mellitus ([place]) 08/10/2010  . Obstructive sleep apnea 12/17/2008  . ESOPHAGEAL STRICTURE 10/09/2008  . Esophageal stricture 10/09/2008  . Reflux esophagitis 09/10/2008  . Diverticulosis of colon 09/10/2008  . Gastro-esophageal reflux disease with esophagitis 09/10/2008  . Coronary atherosclerosis 03/19/2008  . Aortic valve disorder 03/19/2008  . Thoracic aorta atherosclerosis ([place]) 03/19/2008  . SLEEP DISORDER, CHRONIC 10/17/2006  . Disturbance in sleep behavior 10/17/2006  . 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 Essential hypertension 09/23/2006  . GERD 09/23/2006  . BENIGN PROSTATIC HYPERTROPHY 09/23/2006  . Enlarged prostate without lower urinary tract symptoms (luts)  09/23/2006  . HLD (hyperlipidemia) 09/23/2006    Immunization History  Administered Date(s) Administered  . H1N1 03/27/2008  . Influenza Split 12/29/2010, 01/18/2012  . Influenza Whole 02/03/2007, 01/09/2008, 01/01/2009, 12/31/2009  . Influenza,inj,Quad PF,6+ Mos 12/13/2012, 01/15/2014, 01/15/2015, 01/06/2016, 01/03/2017, 02/09/2018, 01/30/2019, 01/05/2020  . PFIZER(Purple Top)SARS-COV-2 Vaccination 04/26/2019, 05/17/2019  . Pneumococcal Conjugate-13 09/10/2013  . Pneumococcal Polysaccharide-23 03/05/1997, 08/17/2011  . Td 03/05/1997, 08/17/2011    Conditions to be addressed/monitored:  Hypertension, Hyperlipidemia and Diabetes  Care Plan : [place]  Updates made by [name], [facility] since 07/25/2020 12:00 AM    Problem: CHL AMB "PATIENT-SPECIFIC PROBLEM"     Long-Range Goal: Disease Management   Start Date: 07/22/2020  Priority: High  Note:   Current Barriers:  . Unable to achieve control of diabetes   Pharmacist Clinical Goal(s):  [name] Patient will adhere to plan to optimize therapeutic regimen for diabetes as evidenced by report of adherence to recommended medication management changes through collaboration with PharmD and provider.   Interventions: . 1:1 collaboration with [name], MD regarding development and update of comprehensive plan of care as evidenced by provider attestation and co-signature . Inter-disciplinary care team collaboration (see longitudinal plan of care) . Comprehensive medication review performed; medication list updated in electronic medical record  Hypertension (BP goal <140/90) -Controlled - per home and office readings  -Current treatment:  Amlodipine 5 mg - 1 tablet daily   Losartan 25 mg - 1 tablet daily  Carvedilol 6.5 mg - 1 tablet BID  Furosemide 40 mg - 1 tablet M,W,F at bedtime -Medications previously tried: hydralazine   -Current home readings: checks about once a month, arm cuff   -Pt reports BP has been  normal,  asked about normal for him - states about 134/66 -Current exercise habits: walks up and down drive way 2-3 times daily -Denies hypotensive/hypertensive symptoms; dizziness on occasion, SOB, on oxygen at nighttime -Educated on Symptoms of hypotension and importance of maintaining adequate hydration; -Counseled to monitor BP at home monthly, document, and provide log at future appointments -Recommended to continue current medication  Hyperlipidemia: (LDL goal < 70) -Not ideally controlled - LDL 125 -Current treatment:  Aspirin 81 mg - 1 tablet daily  Clopidogrel 75 mg - 1 tablet daily -Medications previously tried: atorvastatin (stomach pain/nausea - stopped 10/2017), simvastatin 20 mg (switched to atorvastatin) - Indefinite DAPT per cardio due to multiple stents placed, CBC stable. -We could consider restarting lower dose statin given CV history, but with age/patient goals in mind. Will hold off at this time. Will focus on risk factors - diabetes control. -Educated on Heart healthy diet;  -Recommended to continue current medication  Diabetes (A1c goal <8%) -Uncontrolled - A1c 8.9% (04/11/20) -Current medications: . None -Medications previously tried: metformin - CKD, Rybelsus - patient does not recall, started fall 2022 -Current home glucose readings - checks in morning twice a week . fasting glucose: 189, 187, 165, 200s occasionally . post prandial glucose: none  . denies any less than 70 -Denies hypoglycemic/hyperglycemic symptoms -Current meal patterns:  . breakfast: shredded wheat cereal, yogurt, chocolate milk, pop-tart   . lunch: vegetables mostly (creamed potatoes, cabbage, turnip salad) . dinner: bowl of soup . snacks: none -Current exercise: walks up and down drive way 2-3 times daily -Educated on A1c and blood sugar goals; Complications of diabetes including kidney damage, retinal damage, and cardiovascular disease; -Counseled to check feet daily and get yearly  eye exams - up to date -Collaborated with PCP - will see if patient can restart Rybelsus if no issues were noted  Patient Goals/Self-Care Activities . Patient will:  - check glucose daily before breakfast or bedtime, document, and provide at future appointments  - continue current medications unless further instructed by clinic  Follow Up Plan: Telephone follow up appointment with care management team member scheduled for: 30 days     -Assistant will call in 2 weeks (around Aug 08, 2020) to review blood sugar log and Rybelsus tolerance/cost.  Medication Assistance: None required.  Patient affirms current coverage meets needs.  Patient's preferred pharmacy is: [facility], [address] [place] [address] Phone: [phone] Fax: [phone]  Uses pill box? Yes - he and his wife fill pillbox weekly with BID dosing, wife makes sure he takes it  Pt endorses 100% compliance  Care Plan and Follow Up Patient Decision:  Patient agrees to Care Plan and Follow-up.  [name], PharmD Clinical Pharmacist [facility] at [facility] [phone]  Encounter details: CCM Time Spent      Value Time User   Time spent with patient (minutes)  65 07/25/2020  4:29 PM [name], [facility]   Time spent performing Chart review  30 07/25/2020  4:29 PM [name], [facility]   Total time (minutes)  95 07/25/2020  4:29 PM [name], RPH     Moderate to High Complex Decision Making      Value Time User   Moderate to High complex decision making  Yes 07/25/2020  4:29 PM [name], [facility]     CCM Services: Complex  Prior to outreach and patient consent for Chronic Care Management, I referred this patient for services after reviewing the  nominated patient list or from a personal encounter with the patient.  I have personally reviewed this encounter including the documentation in this note and have collaborated with the care  management provider regarding care management and care coordination activities to include development and update of the comprehensive care plan. I am certifying that I agree with the content of this note and encounter as supervising physician.

## 2020-07-23 ENCOUNTER — Other Ambulatory Visit: Payer: Self-pay | Admitting: Student in an Organized Health Care Education/Training Program

## 2020-07-23 ENCOUNTER — Telehealth: Payer: Self-pay

## 2020-07-23 ENCOUNTER — Other Ambulatory Visit: Payer: Self-pay | Admitting: Internal Medicine

## 2020-07-23 DIAGNOSIS — M792 Neuralgia and neuritis, unspecified: Secondary | ICD-10-CM

## 2020-07-23 DIAGNOSIS — M47816 Spondylosis without myelopathy or radiculopathy, lumbar region: Secondary | ICD-10-CM

## 2020-07-23 DIAGNOSIS — G894 Chronic pain syndrome: Secondary | ICD-10-CM

## 2020-07-23 NOTE — Telephone Encounter (Signed)
Pt started on Rybelsus 12/13/20. No refill history and patient does not recall trial or reason for d/c. Could have been a cost issue? Checking to see if we could restart this unless PCP aware of an issue. Fasting BG 190s lately.  [name], PharmD Clinical Pharmacist [facility] at [facility] [phone]

## 2020-07-24 MED ORDER — RYBELSUS 3 MG PO TABS: 1.0000 | ORAL_TABLET | Freq: Every day | ORAL | 11 refills | Status: DC

## 2020-07-24 NOTE — Telephone Encounter (Signed)
Attempted to call patient and let him know. Left VM with call back information.

## 2020-07-24 NOTE — Telephone Encounter (Signed)
He is not taking it, but open to starting it again. If approved, please send prescription for Rybelsus 3 mg daily to [facility]. Will check price.   Thanks,  [name], PharmD Clinical Pharmacist [facility] at [facility] [phone]

## 2020-07-24 NOTE — Telephone Encounter (Signed)
Rx sent electronically to [facility]

## 2020-07-24 NOTE — Telephone Encounter (Signed)
Go ahead and send the Rx for a year--rybelsus 3mg  There should still have been refills but go ahead and send again

## 2020-07-24 NOTE — Telephone Encounter (Signed)
I had given #11 refills when done in the fall---not sure I wanted to push the dose too much If he is doing well with it--and now sugars high--we can change him to the 7mg  dose and refill for a year  Thanks!

## 2020-07-25 NOTE — Patient Instructions (Addendum)
Dear [name],  Below is a summary of the goals we discussed during our follow up appointment on July 22, 2020. Please contact me anytime with questions or concerns.   Visit Information  Patient Care Plan: CCM Pharmacy Care Plan    Problem Identified: [facility] AMB "PATIENT-SPECIFIC PROBLEM"     Long-Range Goal: Disease Management   Start Date: 07/22/2020  Priority: High  Note:   Current Barriers:  . Unable to achieve control of diabetes   Pharmacist Clinical Goal(s):  [name] Patient will adhere to plan to optimize therapeutic regimen for diabetes as evidenced by report of adherence to recommended medication management changes through collaboration with PharmD and provider.   Interventions: . 1:1 collaboration with [name], MD regarding development and update of comprehensive plan of care as evidenced by provider attestation and co-signature . Inter-disciplinary care team collaboration (see longitudinal plan of care) . Comprehensive medication review performed; medication list updated in electronic medical record  Hypertension (BP goal <140/90) -Controlled - per home and office readings  -Current treatment:  Amlodipine 5 mg - 1 tablet daily   Losartan 25 mg - 1 tablet daily  Carvedilol 6.5 mg - 1 tablet BID  Furosemide 40 mg - 1 tablet M,W,F at bedtime -Medications previously tried: hydralazine   -Current home readings: checks about once a month, arm cuff   -Pt reports BP has been normal, asked about normal for him - states about 134/66 -Current exercise habits: walks up and down drive way 2-3 times daily -Denies hypotensive/hypertensive symptoms; dizziness on occasion, SOB, on oxygen at nighttime -Educated on Symptoms of hypotension and importance of maintaining adequate hydration; -Counseled to monitor BP at home monthly, document, and provide log at future appointments -Recommended to continue current medication  Hyperlipidemia: (LDL goal < 70) -Not ideally  controlled - LDL 125 -Current treatment:  Aspirin 81 mg - 1 tablet daily  Clopidogrel 75 mg - 1 tablet daily -Medications previously tried: atorvastatin (stomach pain/nausea - stopped 10/2017), simvastatin 20 mg (switched to atorvastatin) - Indefinite DAPT per cardio due to multiple stents placed, CBC stable. -We could consider restarting lower dose statin given CV history, but with age/patient goals in mind. Will hold off at this time. Will focus on risk factors - diabetes control. -Educated on Heart healthy diet;  -Recommended to continue current medication  Diabetes (A1c goal <8%) -Uncontrolled - A1c 8.9% (04/11/20) -Current medications: . None -Medications previously tried: metformin - CKD, Rybelsus - patient does not recall, started fall 2022 -Current home glucose readings - checks in morning twice a week . fasting glucose: 189, 187, 165, 200s occasionally . post prandial glucose: none  . denies any less than 70 -Denies hypoglycemic/hyperglycemic symptoms -Current meal patterns:  . breakfast: shredded wheat cereal, yogurt, chocolate milk, pop-tart   . lunch: vegetables mostly (creamed potatoes, cabbage, turnip salad) . dinner: bowl of soup . snacks: none -Current exercise: walks up and down drive way 2-3 times daily -Educated on A1c and blood sugar goals; Complications of diabetes including kidney damage, retinal damage, and cardiovascular disease; -Counseled to check feet daily and get yearly eye exams - up to date -Collaborated with PCP - will see if patient can restart Rybelsus if no issues were noted  Patient Goals/Self-Care Activities . Patient will:  - check glucose daily before breakfast or bedtime, document, and provide at future appointments  - continue current medications unless further instructed by clinic  Follow Up Plan: Telephone follow up appointment with care management team member  scheduled for: 30 days      The patient verbalized understanding of  instructions, educational materials, and care plan provided today and agreed to receive a mailed copy of patient instructions, educational materials, and care plan.    Assistant will call in 2 weeks (around Aug 08, 2020) to review blood sugar log and Rybelsus tolerance/cost.  [name], PharmD Clinical Pharmacist [facility] at [facility] [phone]   Basics of Medicine Management Taking your medicines correctly is an important part of managing or preventing medical problems. Make sure you know what disease or condition your medicine is treating, and how and when to take it. If you do not take your medicine correctly, it may not work well and may cause unpleasant side effects, including serious health problems. What should I do when I am taking medicines?  Read all the labels and inserts that come with your medicines. Review the information often.  Talk with your pharmacist if you get a refill and notice a change in the size, color, or shape of your medicines.  Know the potential side effects for each medicine that you take.  Try to get all your medicines from the same pharmacy. The pharmacist will have all your information and will understand how your medicines will affect each other (interact).  Tell your health care provider about all your medicines, including over-the-counter medicines, vitamins, and herbal or dietary supplements. He or she will make sure that nothing will interact with any of your prescribed medicines.   How can I take my medicines safely?  Take medicines only as told by your health care provider. ? Do not take more of your medicine than instructed. ? Do not take anyone else's medicines. ? Do not share your medicines with others. ? Do not stop taking your medicines unless your health care provider tells you to do so. ? You may need to avoid alcohol or certain foods or liquids when taking certain medicines. Follow your health care provider's  instructions.  Do not split, mash, or chew your medicines unless your health care provider tells you to do so. Tell your health care provider if you have trouble swallowing your medicines.  For liquid medicine, use the dosing container that was provided. How should I organize my medicines? Know your medicines  Know what each of your medicines looks like. This includes size, color, and shape. Tell your health care provider if you are having trouble recognizing all the medicines that you are taking.  If you cannot tell your medicines apart because they look similar, keep them in original bottles.  If you cannot read the labels on the bottles, tell your pharmacist to put your medicines in containers with large print.  Review your medicines and your schedule with family members, a friend, or a caregiver. Use a pill organizer  Use a tool to organize your medicine schedule. Tools include a weekly pillbox, a written chart, a notebook, or a calendar.  Your tool should help you remember the following things about each medicine: ? The name of the medicine. ? The amount (dose) to take. ? The schedule. This is the day and time the medicine should be taken. ? The appearance. This includes color, shape, size, and stamp. ? How to take your medicines. This includes instructions to take them with food, without food, with fluids, or with other medicines.  Create reminders for taking your medicines. Use sticky notes, or alarms on your watch, mobile device, or phone calendar.  You  may choose to use a more advanced management system. These systems have storage, alarms, and visual and audio prompts.  Some medicines can be taken on an "as-needed" basis. These include medicines for nausea or pain. If you take an as-needed medicine, write down the name and dose, as well as the date and time that you took it.   How should I plan for travel?  Take your pillbox, medicines, and organization system with you when  traveling.  Have your medicines refilled before you travel. This will ensure that you do not run out of your medicines while you are away from home.  Always carry an updated list of your medicines with you. If there is an emergency, a first responder can quickly see what medicines you are taking.  Do not pack your medicines in checked luggage in case your luggage is lost or delayed.  If any of your medicines is considered a controlled substance, make sure you bring a letter from your health care provider with you. How should I store and discard my medicines? For safe storage:  Store medicines in a cool, dry area away from light, or as directed by your health care provider. Do not store medicines in the bathroom. Heat and humidity will affect them.  Do not store your medicines with other chemicals, or with medicines for pets or other household members.  Keep medicines away from children and pets. Do not leave them on counters or bedside tables. Store them in high cabinets or on high shelves. For safe disposal:  Check expiration dates regularly. Do not take expired medicines. Discard medicines that are older than the expiration date.  Learn a safe way to dispose of your medicines. You may: ? Use a local government, hospital, or pharmacy medicine-take-back program. ? Mix the medicines with inedible substances, put them in a sealed bag or empty container, and throw them in the trash. What should I remember?  Tell your health care provider if you: ? Experience side effects. ? Have new symptoms. ? Have other concerns about taking your medicines.  Review your medicines regularly with your health care provider. Other medicines, diet, medical conditions, weight changes, and daily habits can all affect how medicines work. Ask if you need to continue taking each medicine, and discuss how well each one is working.  Refill your medicines early to avoid running out of them.  In case of an  accidental overdose, call your local [place] at [phone] or visit your local emergency department immediately. This is important. Summary  Taking your medicines correctly is an important part of managing or preventing medical problems.  You need to make sure that you understand what you are taking a medicine for, as well as how and when you need to take it.  Know your medicines and use a pill organizer to help you take your medicines correctly.  In case of an accidental overdose, call your local [place] at [phone] or visit your local emergency department immediately. This is important. This information is not intended to replace advice given to you by your health care provider. Make sure you discuss any questions you have with your health care provider. Document Revised: 03/17/2017 Document Reviewed: 03/17/2017 Elsevier Patient Education  2021 [employer].

## 2020-07-25 NOTE — Progress Notes (Signed)
Contacted [name], advised him that Rybelsus was called into his pharmacy. Advised patient I would call him back in 2 weeks to review DM log and to make sure he was keeping record. Patient agreed to follow up call.    Follow-Up:  Pharmacist Review  [name], CPP notified  Margaretmary Dys, [place] [facility] Assistant [phone]

## 2020-07-31 ENCOUNTER — Ambulatory Visit: Payer: Medicare Other | Admitting: Podiatry

## 2020-08-01 DIAGNOSIS — L820 Inflamed seborrheic keratosis: Secondary | ICD-10-CM | POA: Diagnosis not present

## 2020-08-01 DIAGNOSIS — D1801 Hemangioma of skin and subcutaneous tissue: Secondary | ICD-10-CM | POA: Diagnosis not present

## 2020-08-01 DIAGNOSIS — D225 Melanocytic nevi of trunk: Secondary | ICD-10-CM | POA: Diagnosis not present

## 2020-08-01 DIAGNOSIS — Z85828 Personal history of other malignant neoplasm of skin: Secondary | ICD-10-CM | POA: Diagnosis not present

## 2020-08-01 DIAGNOSIS — L570 Actinic keratosis: Secondary | ICD-10-CM | POA: Diagnosis not present

## 2020-08-01 DIAGNOSIS — L905 Scar conditions and fibrosis of skin: Secondary | ICD-10-CM | POA: Diagnosis not present

## 2020-08-04 ENCOUNTER — Encounter: Payer: Medicare Other | Admitting: Rehabilitative and Restorative Service Providers"

## 2020-08-07 ENCOUNTER — Ambulatory Visit: Payer: Medicare Other | Admitting: Podiatry

## 2020-08-08 ENCOUNTER — Telehealth: Payer: Self-pay

## 2020-08-08 NOTE — Chronic Care Management (AMB) (Addendum)
Chronic Care Management Pharmacy Assistant   Name: [name]  MRN: [id number] DOB: 1931/11/18  Reason for Encounter: Disease State and Medication Review- Diabetes and Rybelsus tolerance  Recent office visits:  None since last ccm contact  Recent consult visits:  None since last ccm contact  Hospital visits:  04/30/20- ED Visit due to fall 04/10/20- Admission due to Precordial pain 02/20/20- Admission for Endoscopy  Medications: Outpatient Encounter Medications as of 08/08/2020  Medication Sig   amLODipine (NORVASC) 5 MG tablet Take 5 mg by mouth daily.    aspirin EC 81 MG tablet Take 81 mg by mouth at bedtime.   Blood Glucose Monitoring Suppl (ONE TOUCH ULTRA 2) w/Device KIT Use to obtain blood sugar daily. Dx Code E11.40   carvedilol (COREG) 6.25 MG tablet Take 6.25 mg by mouth 2 (two) times daily.   clopidogrel (PLAVIX) 75 MG tablet Take 1 tablet (75 mg total) by mouth daily.   DHA-Vitamin C-Lutein (EYE HEALTH FORMULA PO) Take 1 tablet by mouth daily.    furosemide (LASIX) 40 MG tablet Take 1 tablet (40 mg total) by mouth every Monday, Wednesday, and Friday.   glucose blood (ONE TOUCH ULTRA TEST) test strip USE TO CHECK BLOOD SUGAR ONCE A DAY Dx Code E11.40   HYDROcodone-acetaminophen (NORCO) 10-325 MG tablet Take 1-2 tablets by mouth 2 (two) times daily as needed for severe pain. Must last 30 days.   [START ON 08/10/2020] HYDROcodone-acetaminophen (NORCO) 10-325 MG tablet Take 1-2 tablets by mouth 2 (two) times daily as needed for severe pain. Must last 30 days.   losartan (COZAAR) 25 MG tablet Take 25 mg by mouth daily.   nitroGLYCERIN (NITROSTAT) 0.4 MG SL tablet DISSOLVE 1 TABLET UNDER TONGUE AS NEEDEDFOR CHEST PAIN. MAY REPEAT 5 MINUTES APART 3 TIMES IF NEEDED. IF NO RELIEF CALL [phone]   OneTouch Delica Lancets 16L MISC 1 each by In Vitro route daily. Dx Code E11.49   OXYGEN Inhale into the lungs. Per pt- Uses Oxygen 2 liters at night.   pantoprazole (PROTONIX) 40 MG tablet  TAKE 1 TABLET BY MOUTH TWICE (2) DAILY   polyethylene glycol (MIRALAX / GLYCOLAX) 17 g packet Take 51 g by mouth every other day.    pregabalin (LYRICA) 50 MG capsule Take 1 capsule (50 mg total) by mouth at bedtime.   Semaglutide (RYBELSUS) 3 MG TABS Take 1 tablet by mouth daily.   tamsulosin (FLOMAX) 0.4 MG CAPS capsule Take 0.4 mg by mouth at bedtime.    No facility-administered encounter medications on file as of 08/08/2020.     Recent Relevant Labs: Lab Results  Component Value Date/Time   HGBA1C 8.9 (H) 04/11/2020 03:17 AM   HGBA1C 8.1 (A) 12/14/2019 12:17 PM   HGBA1C 8.0 (H) 06/13/2019 10:57 AM   MICROALBUR 7.5 (H) 04/28/2018 12:28 PM   MICROALBUR 0.5 02/04/2009 10:09 AM    Kidney Function Lab Results  Component Value Date/Time   CREATININE 2.11 (H) 04/30/2020 10:39 AM   CREATININE 2.00 (H) 04/13/2020 06:14 AM   CREATININE 2.28 (H) 06/24/2017 11:36 AM   CREATININE 2.43 (H) 05/27/2017 02:09 PM   GFR 30.78 (L) 06/13/2019 10:57 AM   GFRNONAA 30 (L) 04/30/2020 10:39 AM   GFRNONAA 26 (L) 06/24/2017 11:36 AM   GFRAA 30 (L) 06/24/2017 11:36 AM   Diabetes  Current antihyperglycemic regimen:  Rybelsus 102m - 1 tablet by mouth daily- started taking 07/26/20   Patient verbally confirms he is taking the above medications as directed. Yes  What recent interventions/DTPs have been made to improve glycemic control:  Started Rybelsus  Have there been any recent hospitalizations or ED visits since last visit with CPP? No  Patient denies hypoglycemic symptoms, including Pale, Sweaty, Shaky, Hungry, Nervous/irritable and Vision changes  Patient denies hyperglycemic symptoms, including blurry vision, excessive thirst, fatigue, polyuria and weakness  How often are you checking your blood sugar? States he checks BG on Mon, Wed, Fri and Saturday  What are your blood sugars ranging?  Fasting:  08/07/20- 118 08/02/20-235 07/30/20-231 07/28/20-194 07/26/20-211- Started Rybelsus on this day.   07/22/20-258  On insulin? No   During the week, how often does your blood glucose drop below 70? Never  Are you checking your feet daily/regularly? Yes denies any wounds or blisters. Does state feet hurt.  Adherence Review: Is the patient currently on a STATIN medication? No Is the patient currently on ACE/ARB medication? Yes Does the patient have >5 day gap between last estimated fill dates? No  Star Rating Drugs:  Medication:  Last Fill: Day Supply Losartan 25 mg 07/24/20 90 Rybelsus 3 mg 07/24/20 90  Patient states he is tolerating the medication well. He stated medication was $30 for 1 month supply.   Follow-Up:  Pharmacist Review  [name], CPP notified  Margaretmary Dys, [place] Assistant [phone]  I have reviewed the care management and care coordination activities outlined in this encounter and I am certifying that I agree with the content of this note. After 30 days on starter dose (Rybelsus 3 mg), recommend increasing to Rybelsus 7 mg daily.  Will have CMA call back for BG log in 7 days to consider increasing dose at that time.   [name], PharmD Clinical Pharmacist [facility] at [facility] [phone]

## 2020-08-15 DIAGNOSIS — J8410 Pulmonary fibrosis, unspecified: Secondary | ICD-10-CM | POA: Diagnosis not present

## 2020-08-15 DIAGNOSIS — R0689 Other abnormalities of breathing: Secondary | ICD-10-CM | POA: Diagnosis not present

## 2020-08-20 NOTE — Telephone Encounter (Signed)
Please call patient to review BG log and tolerance again around 08/26/20 to consider increasing dose. The 3 mg dose is just a starter dose. Also review storage and administration for Rybelsus:  Storage -   Store tablets in the closed Rybelsus bottle not in a pillbox or other container. Store at room temperature in a dry place aware from moisture.   Administration - Take Rybelsus upon waking on an empty stomach with a sip of plain water (do no exceed 4 ounces). Wait 30 minutes before the first food, beverage, or other oral medications of the day. Swallow whole, do not crush or split tablets.  [name], PharmD Clinical Pharmacist [facility] at [facility] [phone]

## 2020-08-21 DIAGNOSIS — H353221 Exudative age-related macular degeneration, left eye, with active choroidal neovascularization: Secondary | ICD-10-CM | POA: Diagnosis not present

## 2020-08-21 DIAGNOSIS — D3131 Benign neoplasm of right choroid: Secondary | ICD-10-CM | POA: Diagnosis not present

## 2020-08-21 DIAGNOSIS — H35372 Puckering of macula, left eye: Secondary | ICD-10-CM | POA: Diagnosis not present

## 2020-08-21 DIAGNOSIS — H353213 Exudative age-related macular degeneration, right eye, with inactive scar: Secondary | ICD-10-CM | POA: Diagnosis not present

## 2020-08-25 ENCOUNTER — Telehealth: Payer: Self-pay | Admitting: *Deleted

## 2020-08-25 NOTE — Telephone Encounter (Signed)
Patient called stating that he was put on a pill for his diabetes which was Rybelsus. Patient stated about a week after starting the medication he started noticing blood in his urine. Patient stated that the blood is not bright red but his urine has a tinge of pink in it. Patient stated that he has had some abdominal pain that is about a pain level of 3. Patient stated that he has been having some burning with urination. Patient wants to know if he should stop the medication or what Dr. Silvio Pate would recommend. Pharmacy:  [facility]

## 2020-08-25 NOTE — Telephone Encounter (Signed)
Patient notified as instructed by telephone and verbalized understanding. Patient scheduled an appointment with Dr. Silvio Pate tomorrow 08/26/20 at 11:45 am. Patient given ER precautions and he verbalized understanding.

## 2020-08-25 NOTE — Telephone Encounter (Signed)
The rybelsus could certainly cause abdominal symptoms but not likely the reason for the urinary symptoms. Please get him set up for an appt by tomorrow to be sure no infection (can add on at end if no appts left tomorrow)

## 2020-08-26 ENCOUNTER — Ambulatory Visit (INDEPENDENT_AMBULATORY_CARE_PROVIDER_SITE_OTHER): Payer: Medicare Other | Admitting: Internal Medicine

## 2020-08-26 ENCOUNTER — Encounter: Payer: Self-pay | Admitting: Internal Medicine

## 2020-08-26 ENCOUNTER — Other Ambulatory Visit: Payer: Self-pay

## 2020-08-26 VITALS — BP 122/58 | HR 83 | Temp 97.5°F | Ht 72.0 in | Wt 196.0 lb

## 2020-08-26 DIAGNOSIS — R319 Hematuria, unspecified: Secondary | ICD-10-CM

## 2020-08-26 DIAGNOSIS — N3289 Other specified disorders of bladder: Secondary | ICD-10-CM | POA: Insufficient documentation

## 2020-08-26 LAB — POC URINALSYSI DIPSTICK (AUTOMATED)
Bilirubin, UA: NEGATIVE
Blood, UA: POSITIVE
Glucose, UA: POSITIVE — AB
Ketones, UA: NEGATIVE
Leukocytes, UA: NEGATIVE
Nitrite, UA: NEGATIVE
Protein, UA: POSITIVE — AB
Spec Grav, UA: 1.015 (ref 1.010–1.025)
Urobilinogen, UA: 0.2 E.U./dL
pH, UA: 6 (ref 5.0–8.0)

## 2020-08-26 MED ORDER — CEPHALEXIN 500 MG PO CAPS: 500.0000 mg | ORAL_CAPSULE | Freq: Three times a day (TID) | ORAL | 1 refills | Status: DC

## 2020-08-26 NOTE — Assessment & Plan Note (Signed)
Some pain--may be from recent start of semaglutide Wouldn't cause the gross hematuria Past stone--symptoms not consistent with this--but possible Mild tenderness---will send culture and treat empirically for 3 days CT non contrast If persists, will need urology for cysto

## 2020-08-26 NOTE — Progress Notes (Signed)
Subjective:    Patient ID: [name], male    DOB: 09-05-1931, 87 y.o.   MRN: [id number]  HPI Here due to urinary symptoms and abdominal pain With wife This visit occurred during the SARS-CoV-2 public health emergency.  Safety protocols were in place, including screening questions prior to the visit, additional usage of staff PPE, and extensive cleaning of exam room while observing appropriate contact time as indicated for disinfecting solutions.   Some abdominal pain--could be related to the new rybelsus Not taking it on empty stomach as needed This is mostly better today  Noted some pink discoloration in urine about 3 weeks Not gross blood---"not real red"--but wife notes it is red Some burning--but not much  No fever Did have kidney stone long ago--15 years ago  Asks about taking "super papaya" from daughter (digestive enzyme)  Current Outpatient Medications on File Prior to Visit  Medication Sig Dispense Refill  . amLODipine (NORVASC) 5 MG tablet Take 5 mg by mouth daily.     [name] aspirin EC 81 MG tablet Take 81 mg by mouth at bedtime.    . Blood Glucose Monitoring Suppl (ONE TOUCH ULTRA 2) w/Device KIT Use to obtain blood sugar daily. Dx Code E11.40 1 kit 0  . carvedilol (COREG) 6.25 MG tablet Take 6.25 mg by mouth 2 (two) times daily.    . clopidogrel (PLAVIX) 75 MG tablet Take 1 tablet (75 mg total) by mouth daily. 90 tablet 3  . DHA-Vitamin C-Lutein (EYE HEALTH FORMULA PO) Take 1 tablet by mouth daily.     [name] glucose blood (ONE TOUCH ULTRA TEST) test strip USE TO CHECK BLOOD SUGAR ONCE A DAY Dx Code E11.40 100 each 3  . HYDROcodone-acetaminophen (NORCO) 10-325 MG tablet Take 1-2 tablets by mouth 2 (two) times daily as needed for severe pain. Must last 30 days. 120 tablet 0  . losartan (COZAAR) 25 MG tablet Take 25 mg by mouth daily.    . nitroGLYCERIN (NITROSTAT) 0.4 MG SL tablet DISSOLVE 1 TABLET UNDER TONGUE AS NEEDEDFOR CHEST PAIN. MAY REPEAT 5 MINUTES APART 3 TIMES IF  NEEDED. IF NO RELIEF CALL 911 25 tablet 0  . OneTouch Delica Lancets 29J MISC 1 each by In Vitro route daily. Dx Code E11.49 100 each 3  . OXYGEN Inhale into the lungs. Per pt- Uses Oxygen 2 liters at night.    . pantoprazole (PROTONIX) 40 MG tablet TAKE 1 TABLET BY MOUTH TWICE (2) DAILY 180 tablet 3  . polyethylene glycol (MIRALAX / GLYCOLAX) 17 g packet Take 51 g by mouth every other day.     . pregabalin (LYRICA) 50 MG capsule Take 1 capsule (50 mg total) by mouth at bedtime. 90 capsule 0  . Semaglutide (RYBELSUS) 3 MG TABS Take 1 tablet by mouth daily. 30 tablet 11  . tamsulosin (FLOMAX) 0.4 MG CAPS capsule Take 0.4 mg by mouth at bedtime.     . furosemide (LASIX) 40 MG tablet Take 1 tablet (40 mg total) by mouth every Monday, Wednesday, and Friday. 12 tablet 0   No current facility-administered medications on file prior to visit.    Allergies  Allergen Reactions  . Doxazosin Mesylate Other (See Comments)    dizziness  . Methocarbamol Rash    Past Medical History:  Diagnosis Date  . Arthritis    osteoarthritis, s/p R TKR, and digits  . CAD (coronary artery disease)    a. s/p CABG (2001)  b. s/p DES to RCA and cutting  POBA to ostial PDA (2013)   c. s/p DES to SVG to OM2 (01/14/14) d. cath: 08/2015 NSTEMI w/ patent LIMA-LAD and 99% stenosis of SVG-OM w/ DES placed. CTO of SVG-RCA and SVG-D1.   . Cataract   . Chronic diastolic CHF (congestive heart failure) ([place])    a) 09/13 ECHO- LVEF 33-54%, grade 1 diastolic dysfunction, mild LA dilatation, atrial septal aneurysm, AV mobility restricted, but no sig AS by doppler; b) 09/04/08 ECHO- LVH, ef 60%, mild AS, c. echo 08/2015: EF perserved of 55-60% with inferolateral HK. Mild AS noted.  . Chronic kidney disease, stage III (moderate) ([place])   . Chronic lower back pain   . Colon polyps   . COVID-19   . Diverticulosis   . Dyspnea 2009 since July -Sept   05/06/08-[facility]-  normal effort, reduced VO2 max 20.5 /65%, reduced at 8.2/ 40%, normal  breathing resetvca of 55%, submaximal heart rate response 112/77%, flattened o2 pluse response at peak exercise-12 ml/beat @ 85%, No VQ mismatch abnormalities, All c/w CIRC Limitation  . Enlarged prostate   . Esophageal stricture    a. s/p dilation spring 2010  . GERD (gastroesophageal reflux disease)   . Heart murmur   . Hiatal hernia   . History of carpal tunnel syndrome    Bilateral  . History of kidney stones   . History of PFTs    mixed pattern on spiro. mild restn on lung volumes with near normal DLCO. Pattern can be explained by CABG scar. Fev1 2.2L/73%, ratio 68 (67), TLC 4.7/68%,RV 1.5L/55%,DLCO 79%  . Hyperlipidemia   . Hypertension   . Interstitial lung disease ([name])    NOS  . Iron deficiency anemia   . Nausea & vomiting    2018/2019  . On home oxygen therapy    2 L [place] at bedtime  . Osteoporosis   . Overweight (BMI 25.0-29.9)    BMI 29  . Peripheral neuropathy   . RA (rheumatoid arthritis) ([name])    [name]  . Seropositive rheumatoid arthritis ([place])   . Type II diabetes mellitus ([name])    diet controlled  . Wears glasses   . Wears partial dentures    upper    Past Surgical History:  Procedure Laterality Date  . BALLOON DILATION N/A 09/12/2018   Procedure: BALLOON DILATION;  Surgeon: Irene Shipper, MD;  Location: [facility];  Service: Endoscopy;  Laterality: N/A;  . BALLOON DILATION N/A 02/20/2020   Procedure: BALLOON DILATION;  Surgeon: Irene Shipper, MD;  Location: [facility];  Service: Endoscopy;  Laterality: N/A;  . BOTOX INJECTION N/A 09/12/2018   Procedure: BOTOX INJECTION;  Surgeon: Irene Shipper, MD;  Location: [facility];  Service: Endoscopy;  Laterality: N/A;  . BOTOX INJECTION N/A 11/27/2018   Procedure: BOTOX INJECTION;  Surgeon: Irene Shipper, MD;  Location: [facility];  Service: Endoscopy;  Laterality: N/A;  . BOTOX INJECTION N/A 02/20/2020   Procedure: BOTOX INJECTION;  Surgeon: Irene Shipper, MD;  Location: [facility];  Service:  Endoscopy;  Laterality: N/A;  . CARDIAC CATHETERIZATION  08/2004   CP- no MI, Cath- small vessell disease   . CARDIAC CATHETERIZATION  12/31/2011   80% distal LM, 100% native LAD, LCx and RCA, 30% prox SVG-OM, SVG-D1 normal, 99% distal, 80% ostial SVG-RCA distal to graft, LIMA-LAD normal; LVEF mildly decreased with posterior basal AK   . CARDIAC CATHETERIZATION  2009   with patent grafts/notes 12/31/2011  . CARDIAC CATHETERIZATION N/A 08/13/2015   Procedure:  Left Heart Cath and Cors/Grafts Angiography;  Surgeon: Sherren Mocha, MD;  Location: [facility];  Service: Cardiovascular;  Laterality: N/A;  . CATARACT EXTRACTION W/ INTRAOCULAR LENS  IMPLANT, BILATERAL Bilateral   . CHOLECYSTECTOMY OPEN  11/2003   [name]  . CORONARY ANGIOPLASTY WITH STENT PLACEMENT  01/03/2012   Successful DES to SVG-RCA and cutting balloon angioplasty ostial  PDA   . CORONARY ANGIOPLASTY WITH STENT PLACEMENT  01/14/2014   "1"  . CORONARY ARTERY BYPASS GRAFT  11/1999   CABG X5  . CORONARY STENT PLACEMENT  02/2012   1 stent and balloon  . ESOPHAGEAL DILATION  11/27/2018   Procedure: ESOPHAGEAL DILATION;  Surgeon: Irene Shipper, MD;  Location: [facility];  Service: Endoscopy;;  . ESOPHAGOGASTRODUODENOSCOPY N/A 03/01/2017   Procedure: ESOPHAGOGASTRODUODENOSCOPY (EGD);  Surgeon: Irene Shipper, MD;  Location: [facility];  Service: Endoscopy;  Laterality: N/A;  . ESOPHAGOGASTRODUODENOSCOPY (EGD) WITH ESOPHAGEAL DILATION  2010  . ESOPHAGOGASTRODUODENOSCOPY (EGD) WITH PROPOFOL N/A 09/12/2018   Procedure: ESOPHAGOGASTRODUODENOSCOPY (EGD) WITH PROPOFOL;  Surgeon: Irene Shipper, MD;  Location: [facility];  Service: Endoscopy;  Laterality: N/A;  . ESOPHAGOGASTRODUODENOSCOPY (EGD) WITH PROPOFOL N/A 11/27/2018   Procedure: ESOPHAGOGASTRODUODENOSCOPY (EGD) WITH PROPOFOL, WITH BALLOON DILATION;  Surgeon: Irene Shipper, MD;  Location: [facility];  Service: Endoscopy;  Laterality: N/A;  . ESOPHAGOGASTRODUODENOSCOPY (EGD) WITH  PROPOFOL N/A 02/20/2020   Procedure: ESOPHAGOGASTRODUODENOSCOPY (EGD) WITH PROPOFOL;  Surgeon: Irene Shipper, MD;  Location: [facility];  Service: Endoscopy;  Laterality: N/A;  . HAND SURGERY     bilateral carpal tunnel releases  . JOINT REPLACEMENT    . KNEE ARTHROSCOPY Right 2008  . LEFT AND RIGHT HEART CATHETERIZATION WITH CORONARY ANGIOGRAM  12/31/2011   Procedure: LEFT AND RIGHT HEART CATHETERIZATION WITH CORONARY ANGIOGRAM;  Surgeon: Burnell Blanks, MD;  Location: [facility];  Service: Cardiovascular;;  . LEFT AND RIGHT HEART CATHETERIZATION WITH CORONARY ANGIOGRAM N/A 01/14/2014   Procedure: LEFT AND RIGHT HEART CATHETERIZATION WITH CORONARY ANGIOGRAM;  Surgeon: Peter M Martinique, MD;  Location: [facility] C[facility] LAB;  Service: Cardiovascular;  Laterality: N/A;  . MALONEY DILATION  03/01/2017   Procedure: Venia Minks DILATION;  Surgeon: Irene Shipper, MD;  Location: [facility];  Service: Endoscopy;;  . PERCUTANEOUS CORONARY INTERVENTION-BALLOON ONLY  01/03/2012   Procedure: PERCUTANEOUS CORONARY INTERVENTION-BALLOON ONLY;  Surgeon: Peter M Martinique, MD;  Location: [facility];  Service: Cardiovascular;;  . PERCUTANEOUS CORONARY STENT INTERVENTION (PCI-S)  12/31/2011   Procedure: PERCUTANEOUS CORONARY STENT INTERVENTION (PCI-S);  Surgeon: Burnell Blanks, MD;  Location: [facility] CATH LAB;  Service: Cardiovascular;;  . PERCUTANEOUS CORONARY STENT INTERVENTION (PCI-S) N/A 01/03/2012   Procedure: PERCUTANEOUS CORONARY STENT INTERVENTION (PCI-S);  Surgeon: Peter M Martinique, MD;  Location: [facility] CATH LAB;  Service: Cardiovascular;  Laterality: N/A;  . SHOULDER ARTHROSCOPY WITH OPEN ROTATOR CUFF REPAIR AND DISTAL CLAVICLE ACROMINECTOMY Left 02/27/2013   Procedure: LEFT SHOULDER ARTHROSCOPY WITH MINI OPEN ROTATOR CUFF REPAIR AND SUBACROMIAL DECOMPRESSION AND DISTAL CLAVICLE RESECTION;  Surgeon: [name], MD;  Location: [facility];  Service: Orthopedics;  Laterality: Left;  . TOTAL KNEE ARTHROPLASTY Right  03/2010   [name]  . TRIGGER FINGER RELEASE Left 02/27/2013   Procedure: RELEASE TRIGGER FINGER/A-1 PULLEY;  Surgeon: [name], MD;  Location: [facility];  Service: Orthopedics;  Laterality: Left;    Family History  Problem Relation Age of Onset  . COPD Mother   . Heart disease Father   . Heart attack Father   .  Stomach cancer Brother   . Stroke Sister   . Alcohol abuse Sister   . Colon cancer Brother 25  . Diabetes Brother   . Rectal cancer Neg Hx     Social History   Socioeconomic History  . Marital status: Married    Spouse name: Not on file  . Number of children: 3  . Years of education: Not on file  . Highest education level: Not on file  Occupational History  . Occupation: Designer, jewellery: RETIRED    Comment: retired  Tobacco Use  . Smoking status: Former Smoker    Packs/day: 1.00    Years: 20.00    Pack years: 20.00    Types: Cigarettes    Quit date: 04/06/1963    Years since quitting: 57.4  . Smokeless tobacco: Former Network engineer  . Vaping Use: Never used  Substance and Sexual Activity  . Alcohol use: No    Alcohol/week: 0.0 standard drinks    Comment: 01/01/2012 "last alcohol ~ 50 yr ago"  . Drug use: No  . Sexual activity: Not Currently  Other Topics Concern  . Not on file  Social History Narrative   No living will   Requests wife as health care POA-- alternate is daughter Hassan Rowan   Discussed DNR --he requests this (done 08/29/12)   Not sure about feeding tube---but might accept for some time   Patient lives with wife and daughter in a one story home.  Has 3 children.  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   Social Determinants of Health   Financial Resource Strain: Low Risk   . Difficulty of Paying Living Expenses: Not very hard  Food Insecurity: Not on file  Transportation Needs: Not on file  Physical Activity: Not on file  Stress: Not on file  Social Connections: Not on file  Intimate Partner Violence: Not  on file   Review of Systems No N/V Appetite is pretty good Sleeps okay--in recliner    Objective:   Physical Exam Constitutional:      Appearance: Normal appearance.  Abdominal:     Palpations: Abdomen is soft.     Tenderness: There is no right CVA tenderness, left CVA tenderness, guarding or rebound.     Comments: Mild tenderness in suprapubic area  Neurological:     Mental Status: He is alert.            Assessment & Plan:

## 2020-08-26 NOTE — Progress Notes (Addendum)
BG log (fasting)             08/05/20  168             08/09/20  181  08/15/20  146  08/23/20  186  The patient has appointment later this morning to see Dr.Letak for possible UTI.The patient informed me that he did not read the instructions on how to take the medication Rybelsus. I discussed with the patient to take early in the morning on an empty stomach with only a sip of water. Wait 30 minutes before having anything to eat or drink, swallow the medication whole. The patient was also putting the Rybelsus in his pill box. He will keep the medication in the bottle from now on as he states he will pick up a new prescription of Rybelsus 3mg  today  [name], CPP notified  Avel Sensor, [place] Assistant [phone]  I have reviewed the care management and care coordination activities outlined in this encounter and I am certifying that I agree with the content of this note. Will wait until PCP visit to consider dose increase since pt is having some abdominal pain today and query related to the Rybelsus. CCM follow up in July 2022.  [name], PharmD Clinical Pharmacist [facility] [phone]

## 2020-08-27 LAB — URINE CULTURE
MICRO NUMBER:: 11928227
SPECIMEN QUALITY:: ADEQUATE

## 2020-08-28 ENCOUNTER — Ambulatory Visit: Payer: Medicare Other | Admitting: Podiatry

## 2020-09-02 ENCOUNTER — Other Ambulatory Visit: Payer: Self-pay

## 2020-09-02 ENCOUNTER — Ambulatory Visit (INDEPENDENT_AMBULATORY_CARE_PROVIDER_SITE_OTHER)
Admission: RE | Admit: 2020-09-02 | Discharge: 2020-09-02 | Disposition: A | Discharge status: Home / Self Care | Payer: Medicare Other | Source: Ambulatory Visit | Attending: Internal Medicine | Admitting: Internal Medicine

## 2020-09-02 DIAGNOSIS — N281 Cyst of kidney, acquired: Secondary | ICD-10-CM | POA: Diagnosis not present

## 2020-09-02 DIAGNOSIS — R319 Hematuria, unspecified: Secondary | ICD-10-CM

## 2020-09-02 DIAGNOSIS — N200 Calculus of kidney: Secondary | ICD-10-CM | POA: Diagnosis not present

## 2020-09-02 DIAGNOSIS — S22089A Unspecified fracture of T11-T12 vertebra, initial encounter for closed fracture: Secondary | ICD-10-CM | POA: Diagnosis not present

## 2020-09-03 ENCOUNTER — Telehealth: Payer: Self-pay | Admitting: Internal Medicine

## 2020-09-03 DIAGNOSIS — D494 Neoplasm of unspecified behavior of bladder: Secondary | ICD-10-CM

## 2020-09-03 NOTE — Telephone Encounter (Signed)
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

## 2020-09-03 NOTE — Telephone Encounter (Signed)
Repeat of prior note I had gotten and we had discussed. No further critical results

## 2020-09-03 NOTE — Telephone Encounter (Signed)
Dr Jobe Igo would like you to call him back in reference to some unexpected critical results. Please call him back asap. EM

## 2020-09-04 NOTE — Telephone Encounter (Signed)
Sent referral over to [facility] and spoke with Mickel Baas, triage nurse, explained the issue. Patient was already scheduled with Dr Tammi Klippel for 10/27/20, Mickel Baas will send notes to Dr Tammi Klippel for review to get his feedback on when can patient be worked in. Dr Tammi Klippel is out of the office this afternoon and may not be able to answer until Monday next week. We will follow up on this at that time. Advised patient of the process at this time.  Patient also wanted to let Dr Silvio Pate know that he has not had any bleeding for about a week now.  FYI to Dr Silvio Pate

## 2020-09-04 NOTE — Telephone Encounter (Signed)
Okay He clearly needs to be seen sooner--with an almost certain large bladder cancer. Next week sometime

## 2020-09-04 NOTE — Telephone Encounter (Signed)
Noted, yes, working with [facility] to get patient scheduled sooner. Per patient he has transportation on Mondays, Wednesdays, Fridays.

## 2020-09-08 ENCOUNTER — Encounter: Payer: Medicare Other | Admitting: Rehabilitative and Restorative Service Providers"

## 2020-09-09 ENCOUNTER — Encounter: Payer: Self-pay | Admitting: Student in an Organized Health Care Education/Training Program

## 2020-09-09 ENCOUNTER — Other Ambulatory Visit: Payer: Self-pay

## 2020-09-09 ENCOUNTER — Ambulatory Visit
Payer: Medicare Other | Attending: Student in an Organized Health Care Education/Training Program | Admitting: Student in an Organized Health Care Education/Training Program

## 2020-09-09 VITALS — BP 119/70 | HR 69 | Temp 97.0°F | Resp 18 | Ht 72.0 in | Wt 196.0 lb

## 2020-09-09 DIAGNOSIS — M059 Rheumatoid arthritis with rheumatoid factor, unspecified: Secondary | ICD-10-CM | POA: Insufficient documentation

## 2020-09-09 DIAGNOSIS — M79605 Pain in left leg: Secondary | ICD-10-CM | POA: Insufficient documentation

## 2020-09-09 DIAGNOSIS — E1142 Type 2 diabetes mellitus with diabetic polyneuropathy: Secondary | ICD-10-CM | POA: Diagnosis not present

## 2020-09-09 DIAGNOSIS — M79604 Pain in right leg: Secondary | ICD-10-CM | POA: Diagnosis not present

## 2020-09-09 DIAGNOSIS — M792 Neuralgia and neuritis, unspecified: Secondary | ICD-10-CM | POA: Diagnosis not present

## 2020-09-09 DIAGNOSIS — G8929 Other chronic pain: Secondary | ICD-10-CM

## 2020-09-09 DIAGNOSIS — M47816 Spondylosis without myelopathy or radiculopathy, lumbar region: Secondary | ICD-10-CM | POA: Insufficient documentation

## 2020-09-09 DIAGNOSIS — G894 Chronic pain syndrome: Secondary | ICD-10-CM | POA: Diagnosis not present

## 2020-09-09 MED ORDER — PREGABALIN 50 MG PO CAPS: 50.0000 mg | ORAL_CAPSULE | Freq: Every day | ORAL | 0 refills | Status: DC

## 2020-09-09 MED ORDER — HYDROCODONE-ACETAMINOPHEN 10-325 MG PO TABS: 1.0000 | ORAL_TABLET | Freq: Two times a day (BID) | ORAL | 0 refills | Status: AC | PRN

## 2020-09-09 MED ORDER — HYDROCODONE-ACETAMINOPHEN 10-325 MG PO TABS: 1.0000 | ORAL_TABLET | Freq: Two times a day (BID) | ORAL | 0 refills | Status: DC | PRN

## 2020-09-09 NOTE — Progress Notes (Signed)
Nursing Pain Medication Assessment:  Safety precautions to be maintained throughout the outpatient stay will include: orient to surroundings, keep bed in low position, maintain call bell within reach at all times, provide assistance with transfer out of bed and ambulation.  Medication Inspection Compliance: Pill count conducted under aseptic conditions, in front of the patient. Neither the pills nor the bottle was removed from the patient's sight at any time. Once count was completed pills were immediately returned to the patient in their original bottle.  Medication: Hydrocodone/APAP Pill/Patch Count: 71 of 120 pills remain Pill/Patch Appearance: Markings consistent with prescribed medication Bottle Appearance: Standard pharmacy container. Clearly labeled. Filled Date:05 /09 / 2022 Last Medication intake:  Today

## 2020-09-09 NOTE — Progress Notes (Signed)
PROVIDER NOTE: Information contained herein reflects review and annotations entered in association with encounter. Interpretation of such information and data should be left to medically-trained personnel. Information provided to patient can be located elsewhere in the medical record under "Patient Instructions". Document created using STT-dictation technology, any transcriptional errors that may result from process are unintentional.    Patient: [name]  Service Category: E/M  Provider: Gillis Santa, MD  DOB: 1932-01-07  DOS: 09/09/2020  Specialty: Interventional Pain Management  MRN: [id number]  Setting: Ambulatory outpatient  PCP: [name], MD  Type: Established Patient    Referring Provider: [name], MD  Location: Office  Delivery: Face-to-face     HPI  [name], a 87 y.o. year old male, is here today because of his Lumbar facet arthropathy [M47.816]. [name] primary complain today is Foot Pain (bilateral) Last encounter: My last encounter with him was on 07/23/2020. Pertinent problems: [name] has CKD (chronic kidney disease) stage 4, GFR 15-29 ml/min ([place]); Spinal stenosis of lumbar region without neurogenic claudication; Lumbar facet arthropathy; Chronic pain of both lower extremities; Neuropathic pain; and Chronic pain syndrome on their pertinent problem list. Pain Assessment: Severity of Chronic pain is reported as a 5 /10. Location: Foot Right,Left/denies. Onset: More than a month ago. Quality: Sharp. Timing: Constant. Modifying factor(s): nothing. Vitals:  height is 6' (1.829 m) and weight is 196 lb (88.9 kg). His temperature is 97 F (36.1 C) (abnormal). His blood pressure is 119/70 and his pulse is 69. His respiration is 18 and oxygen saturation is 95%.   Reason for encounter: medication management.    Patient presents today for medication management.  Since his last clinic visit patient had symptoms of hematuria for which she had a CT renal  study done.  This showed a calcified mass on his bladder wall and urological consultation was recommended.  He has not seen urology yet.  He does have a remote inferior endplate T11 fracture that was noted on CT.  He has fusion of SI joints bilaterally.  He is complaining of bilateral foot pain as well as low back pain.  I will refill his hydrocodone and Lyrica as below.  In the past, we have discussed lumbar radiofrequency ablation on the right side at L3, L4, L5 for her low back pain related to lumbar facet arthropathy and spondylosis.  Patient states that he would like to move forward with this.  We will plan to schedule for this as well.  Patient is also due for a repeat urine toxicology screen for medication compliance monitoring which we will obtain today as well.  Pharmacotherapy Assessment   Analgesic: Hydrocodone 10 to 20 mg twice daily as needed    Monitoring: [place] PMP: PDMP reviewed during this encounter.       Pharmacotherapy: No side-effects or adverse reactions reported. Compliance: No problems identified. Effectiveness: Clinically acceptable.  [name], RN  09/09/2020  1:28 PM  Signed Nursing Pain Medication Assessment:  Safety precautions to be maintained throughout the outpatient stay will include: orient to surroundings, keep bed in low position, maintain call bell within reach at all times, provide assistance with transfer out of bed and ambulation.  Medication Inspection Compliance: Pill count conducted under aseptic conditions, in front of the patient. Neither the pills nor the bottle was removed from the patient's sight at any time. Once count was completed pills were immediately returned to the patient in their original bottle.  Medication: Hydrocodone/APAP Pill/Patch  Count: 71 of 120 pills remain Pill/Patch Appearance: Markings consistent with prescribed medication Bottle Appearance: Standard pharmacy container. Clearly labeled. Filled Date:05 /09 / 2022 Last Medication  intake:  Today    UDS:  Summary  Date Value Ref Range Status  09/18/2019 Note  Final    Comment:    ==================================================================== ToxASSURE Select 13 (MW) ==================================================================== Test                             Result       Flag       Units  Drug Present and Declared for Prescription Verification   Hydrocodone                    1687         EXPECTED   ng/mg creat   Hydromorphone                  2436         EXPECTED   ng/mg creat   Dihydrocodeine                 436          EXPECTED   ng/mg creat   Norhydrocodone                 1384         EXPECTED   ng/mg creat    Sources of hydrocodone include scheduled prescription medications.    Hydromorphone, dihydrocodeine and norhydrocodone are expected    metabolites of hydrocodone. Hydromorphone and dihydrocodeine are    also available as scheduled prescription medications.  ==================================================================== Test                      Result    Flag   Units      Ref Range   Creatinine              45               mg/dL      >=20 ==================================================================== Declared Medications:  The flagging and interpretation on this report are based on the  following declared medications.  Unexpected results may arise from  inaccuracies in the declared medications.   **Note: The testing scope of this panel includes these medications:   Hydrocodone (Norco)  Hydrocodone   **Note: The testing scope of this panel does not include the  following reported medications:   Acetaminophen (Norco)  Amlodipine (Norvasc)  Aspirin  Carvedilol (Coreg)  Clopidogrel (Plavix)  Furosemide (Lasix)  Helium  Losartan (Cozaar)  Nitroglycerin (Nitrostat)  Ondansetron (Zofran)  Oxygen  Pantoprazole (Protonix)  Polyethylene Glycol  Tamsulosin  (Flomax) ==================================================================== For clinical consultation, please call [phone]. ====================================================================      ROS  Constitutional: Denies any fever or chills Gastrointestinal: No reported hemesis, hematochezia, vomiting, or acute GI distress Musculoskeletal: Low back, bilateral leg pain Neurological: No reported episodes of acute onset apraxia, aphasia, dysarthria, agnosia, amnesia, paralysis, loss of coordination, or loss of consciousness  Medication Review  DHA-Vitamin C-Lutein, HYDROcodone-acetaminophen, ONE TOUCH ULTRA 2, OneTouch Delica Lancets 63K, Oxygen-Helium, Semaglutide, amLODipine, aspirin EC, carvedilol, cephALEXin, clopidogrel, furosemide, glucose blood, losartan, nitroGLYCERIN, pantoprazole, polyethylene glycol, pregabalin, and tamsulosin  History Review  Allergy: [name] is allergic to doxazosin mesylate and methocarbamol. Drug: [name]  reports no history of drug use. Alcohol:  reports no history of alcohol use. Tobacco:  reports that  he quit smoking about 57 years ago. His smoking use included cigarettes. He has a 20.00 pack-year smoking history. He has quit using smokeless tobacco. Social: [name]  reports that he quit smoking about 57 years ago. His smoking use included cigarettes. He has a 20.00 pack-year smoking history. He has quit using smokeless tobacco. He reports that he does not drink alcohol and does not use drugs. Medical:  has a past medical history of Arthritis, CAD (coronary artery disease), Cataract, Chronic diastolic CHF (congestive heart failure) ([place]), Chronic kidney disease, stage III (moderate) ([place]), Chronic lower back pain, Colon polyps, COVID-19, Diverticulosis, Dyspnea (2009 since July -Sept), Enlarged prostate, Esophageal stricture, GERD (gastroesophageal reflux disease), Heart murmur, Hiatal hernia, History of carpal tunnel syndrome, History  of kidney stones, History of PFTs, Hyperlipidemia, Hypertension, Interstitial lung disease ([place]), Iron deficiency anemia, Nausea & vomiting, On home oxygen therapy, Osteoporosis, Overweight (BMI 25.0-29.9), Peripheral neuropathy, RA (rheumatoid arthritis) ([place]), Seropositive rheumatoid arthritis ([place]), Type II diabetes mellitus ([place]), Wears glasses, and Wears partial dentures. Surgical: [name]  has a past surgical history that includes Total knee arthroplasty (Right, 03/2010); Knee arthroscopy (Right, 2008); Cataract extraction w/ intraocular lens  implant, bilateral (Bilateral); Coronary artery bypass graft (11/1999); Coronary stent placement (02/2012); Shoulder arthroscopy with open rotator cuff repair and distal clavicle acrominectomy (Left, 02/27/2013); Trigger finger release (Left, 02/27/2013); Cholecystectomy open (11/2003); Joint replacement; Esophagogastroduodenoscopy (egd) with esophageal dilation (2010); Cardiac catheterization (08/2004); Cardiac catheterization (12/31/2011); Cardiac catheterization (2009); left and right heart catheterization with coronary angiogram (12/31/2011); percutaneous coronary stent intervention (pci-s) (12/31/2011); percutaneous coronary stent intervention (pci-s) (N/A, 01/03/2012); Percutaneous coronary intervention-balloon only (01/03/2012); left and right heart catheterization with coronary angiogram (N/A, 01/14/2014); Hand surgery; Cardiac catheterization (N/A, 08/13/2015); Esophagogastroduodenoscopy (N/A, 03/01/2017); maloney dilation (03/01/2017); Esophagogastroduodenoscopy (egd) with propofol (N/A, 09/12/2018); Botox injection (N/A, 09/12/2018); Balloon dilation (N/A, 09/12/2018); Coronary angioplasty with stent (01/03/2012); Coronary angioplasty with stent (01/14/2014); Esophagogastroduodenoscopy (egd) with propofol (N/A, 11/27/2018); Botox injection (N/A, 11/27/2018); Esophageal dilation (11/27/2018); Esophagogastroduodenoscopy (egd) with propofol (N/A, 02/20/2020); Botox injection  (N/A, 02/20/2020); and Balloon dilation (N/A, 02/20/2020). Family: family history includes Alcohol abuse in his sister; COPD in his mother; Colon cancer (age of onset: 73) in his brother; Diabetes in his brother; Heart attack in his father; Heart disease in his father; Stomach cancer in his brother; Stroke in his sister.  Laboratory Chemistry Profile   Renal Lab Results  Component Value Date   BUN 38 (H) 04/30/2020   CREATININE 2.11 (H) 04/30/2020   BCR 13 06/24/2017   GFR 30.78 (L) 06/13/2019   GFRAA 30 (L) 06/24/2017   GFRNONAA 30 (L) 04/30/2020     Hepatic Lab Results  Component Value Date   AST 14 06/13/2019   ALT 9 06/13/2019   ALBUMIN 4.1 06/13/2019   ALBUMIN 4.1 06/13/2019   ALKPHOS 58 06/13/2019     Electrolytes Lab Results  Component Value Date   NA 139 04/30/2020   K 4.0 04/30/2020   CL 99 04/30/2020   CALCIUM 9.9 04/30/2020   MG 2.1 04/12/2020   PHOS 2.9 06/13/2019     Bone No results found for: VD25OH, VD125OH2TOT, DD2202RK2, HC6237SE8, 25OHVITD1, 25OHVITD2, 25OHVITD3, TESTOFREE, TESTOSTERONE   Inflammation (CRP: Acute Phase) (ESR: Chronic Phase) Lab Results  Component Value Date   ESRSEDRATE 38 (H) 11/22/2011       Note: Above Lab results reviewed.  Recent Imaging Review  CT RENAL STONE STUDY CLINICAL DATA:  Hematuria. Symptoms for 3 weeks. Personal history of kidney stone. Hematuria has since resolved.  EXAM: CT ABDOMEN AND PELVIS WITHOUT CONTRAST  TECHNIQUE: Multidetector CT imaging of the abdomen and pelvis was performed following the standard protocol without IV contrast.  COMPARISON:  Renal ultrasound 11/27/2013.  FINDINGS: Lower chest: Minimal atelectasis is present at bases. Lungs otherwise clear. No pleural pericardial effusion is present. Artery calcifications are present. Heart size is normal.  Hepatobiliary: No focal liver abnormality is seen. Status post cholecystectomy. No biliary dilatation.  Pancreas: Unremarkable. No  pancreatic ductal dilatation or surrounding inflammatory changes.  Spleen: Normal in size without focal abnormality.  Adrenals/Urinary Tract: Adrenal glands are normal bilaterally. Foraminal densities bilateral renal atrophy noted. A water density exophytic cyst the upper pole of the left kidney measures up to 5 cm. A second adjacent lesion 10 mm in width. 15 mm lesion is present in the lower pole of the left kidney. No obstruction is present. Multiple punctate nonobstructing stones are present in both kidneys. The ureters are unremarkable. No obstruction is present.  Diffuse bladder wall thickening is present. Partially calcified mass is present bladder wall roof of the bladder measuring 1.9 x 3.1 x 2.5 cm.  Stomach/Bowel: The stomach and duodenum are within normal limits. Proximal small bowel is normal. Multiple loops of distal small bowel extend into a right inguinal hernia. No dilated or strangulated loops are present. The entering and exiting loops are unremarkable. More distal small bowel is normal. Appendix is within normal limits. The ascending and transverse colon are normal. The descending and sigmoid colon are within normal limits.  Vascular/Lymphatic: Atherosclerotic calcifications are present throughout the aorta and branch vessels without aneurysm. No significant adenopathy is present.  Reproductive: The prostate is enlarged, measuring up to 5.6 cm.  Other: Bilateral inguinal hernias are present. Larger is on right, containing multiple loops of small bowel as discussed obstruction. Additional ventral hernia is present. No free fluid or free air is present.  Musculoskeletal: Remote inferior endplate T11 fracture present with associated kyphosis in 12 disc disease. Increased density bone likely related to the fracture. No other focal sclerotic areas are present to suggest metastases. SI joints are fused bilaterally. Pelvis is otherwise within normal limits. Mild  degenerative changes are noted in the hips.  IMPRESSION: 1. Partially calcified mass at the bladder wall roof of the bladder measures 1.9 x 3.1 x 2.5 cm. 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 Recommend urology consultation. 2. Multiple punctate nonobstructing stones in both kidneys. 3. Bilateral renal cysts. 4. Diffuse bladder wall thickening compatible with chronic outlet obstruction. A portion of this could be related to the malignancy. 5. Enlarged prostate gland. 6. Remote inferior endplate T11 fracture with associated kyphosis and disc disease. 7. Fusion of the SI joints bilaterally. 8. Aortic Atherosclerosis (ICD10-I70.0).  Electronically Signed   By: San Morelle M.D.   On: 09/03/2020 10:35 Note: Reviewed        Physical Exam  General appearance: Well nourished, well developed, and well hydrated. In no apparent acute distress Mental status: Alert, oriented x 3 (person, place, & time)       Respiratory: No evidence of acute respiratory distress Eyes: PERLA Vitals: BP 119/70   Pulse 69   Temp (!) 97 F (36.1 C)   Resp 18   Ht 6' (1.829 m)   Wt 196 lb (88.9 kg)   SpO2 95%   BMI 26.58 kg/m  BMI: Estimated body mass index is 26.58 kg/m as calculated from the following:   Height as of this encounter: 6' (1.829 m).   Weight as  of this encounter: 196 lb (88.9 kg). Ideal: Ideal body weight: 77.6 kg (171 lb 1.2 oz) Adjusted ideal body weight: 82.1 kg (181 lb 0.7 oz)   Lumbar Spine Area Exam  Skin & Axial Inspection:No masses, redness, or swelling Alignment:Symmetrical Functional WCB:[id number] ROM, slightly improved after treatment Stability:No instability detected Muscle Tone/Strength:Functionally intact. No obvious neuro-muscular anomalies detected. Sensory (Neurological):Musculoskeletal pain pattern, improved after treatment Palpation:No palpable anomalies Provocative Tests: Hyperextension/rotation test:(+)bilaterally for facet  joint pain. Lumbar quadrant test (Kemp's test):(+)bilaterally for facet joint pain.  Gait & Posture Assessment  Ambulation:Unassisted Gait:Relatively normal for age and body habitus Posture:WNL Lower Extremity Exam    Side:Right lower extremity  Side:Left lower extremity  Stability:No instability observed  Stability:No instability observed  Skin & Extremity Inspection:Skin color, temperature, and hair growth are WNL. No peripheral edema or cyanosis. No masses, redness, swelling, asymmetry, or associated skin lesions. No contractures.  Skin & Extremity Inspection:Skin color, temperature, and hair growth are WNL. No peripheral edema or cyanosis. No masses, redness, swelling, asymmetry, or associated skin lesions. No contractures.  Functional OHY:WVPXTGGYI ROM   Functional RSW:NIOEVOJJK ROM   Muscle Tone/Strength:Functionally intact. No obvious neuro-muscular anomalies detected.  Muscle Tone/Strength:Functionally intact. No obvious neuro-muscular anomalies detected.  Sensory (Neurological):Neuropathic pain pattern  Sensory (Neurological):Neuropathic pain pattern  DTR: Patellar:deferred today Achilles:deferred today Plantar:deferred today  DTR: Patellar:deferred today Achilles:deferred today Plantar:deferred today  Palpation:No palpable anomalies  Palpation:No palpable anomalies    Assessment   Status Diagnosis  Persistent Persistent Persistent 1. Lumbar facet arthropathy   2. Lumbar spondylosis   3. Neuropathic pain   4. Diabetic polyneuropathy associated with type 2 diabetes mellitus ([place])   5. Seropositive rheumatoid arthritis ([place])   6. Chronic pain of both lower extremities   7. Chronic pain syndrome       Plan of Care   [name] has a current medication list which includes the following long-term medication(s): furosemide, nitroglycerin, pantoprazole, and  pregabalin.  Pharmacotherapy (Medications Ordered): Meds ordered this encounter  Medications  . HYDROcodone-acetaminophen (NORCO) 10-325 MG tablet    Sig: Take 1-2 tablets by mouth 2 (two) times daily as needed for severe pain. Must last 30 days.    Dispense:  120 tablet    Refill:  0    Chronic Pain. (STOP Act - Not applicable). Fill one day early if closed on scheduled refill date.  [name] HYDROcodone-acetaminophen (NORCO) 10-325 MG tablet    Sig: Take 1-2 tablets by mouth 2 (two) times daily as needed for severe pain. Must last 30 days.    Dispense:  120 tablet    Refill:  0    Chronic Pain. (STOP Act - Not applicable). Fill one day early if closed on scheduled refill date.  [name] HYDROcodone-acetaminophen (NORCO) 10-325 MG tablet    Sig: Take 1-2 tablets by mouth 2 (two) times daily as needed for severe pain. Must last 30 days.    Dispense:  120 tablet    Refill:  0    Chronic Pain. (STOP Act - Not applicable). Fill one day early if closed on scheduled refill date.  . pregabalin (LYRICA) 50 MG capsule    Sig: Take 1 capsule (50 mg total) by mouth at bedtime.    Dispense:  90 capsule    Refill:  0    Fill one day early if pharmacy is closed on scheduled refill date. May substitute for generic if available.   Plan for right L3, L4, L5 lumbar radiofrequency ablation for lumbar facet  joint syndrome.  Patient is status post 2 diagnostic lumbar facet medial branch nerve blocks on the right side at L3, L4, L5 on 10/03/2019 and 04/09/2020 we provided greater than 75% pain relief for 4 weeks.  For the purpose of obtaining longer-term pain relief, recommend lumbar RFA.  Orders:  Orders Placed This Encounter  Procedures  . ToxASSURE Select 13 (MW), Urine    Volume: 30 ml(s). Minimum 3 ml of urine is needed. Document temperature of fresh sample. Indications: Long term (current) use of opiate analgesic [phone])    Order Specific Question:   Release to patient    Answer:   Immediate   Follow-up  plan:   Return in about 2 weeks (around 09/23/2020) for R L3,4,5 RFA with sedation (stop Plavix 7 days prior).   Recent Visits Date Type Provider Dept  06/11/20 Office Visit Gillis Santa, MD [facility]  Showing recent visits within past 90 days and meeting all other requirements Today's Visits Date Type Provider Dept  09/09/20 Office Visit Gillis Santa, MD [facility]  Showing today's visits and meeting all other requirements Future Appointments Date Type Provider Dept  09/17/20 Appointment Gillis Santa, MD [facility]  12/04/20 Appointment Gillis Santa, MD [facility]  Showing future appointments within next 90 days and meeting all other requirements  I discussed the assessment and treatment plan with the patient. The patient was provided an opportunity to ask questions and all were answered. The patient agreed with the plan and demonstrated an understanding of the instructions.  Patient advised to call back or seek an in-person evaluation if the symptoms or condition worsens.  Duration of encounter: 14mnutes.  Note by: BGillis Santa MD Date: 09/09/2020; Time: 2:29 PM

## 2020-09-09 NOTE — Patient Instructions (Addendum)
Stop Plavix 7 days prior to RFA.  May take Aspirin 81 mg instead.  Do not eat or drink for 8 hours, bring a driver.      Preparing for Procedure with Sedation Instructions: . Oral Intake: Do not eat or drink anything for at least 8 hours prior to your procedure. . Transportation: Public transportation is not allowed. Bring an adult driver. The driver must be physically present in our waiting room before any procedure can be started. [name] Physical Assistance: Bring an adult capable of physically assisting you, in the event you need help. . Blood Pressure Medicine: Take your blood pressure medicine with a sip of water the morning of the procedure. . Insulin: Take only  of your normal insulin dose. . Preventing infections: Shower with an antibacterial soap the morning of your procedure. . Build-up your immune system: Take 1000 mg of Vitamin C with every meal (3 times a day) the day prior to your procedure. . Pregnancy: If you are pregnant, call and cancel the procedure. . Sickness: If you have a cold, fever, or any active infections, call and cancel the procedure. . Arrival: You must be in the facility at least 30 minutes prior to your scheduled procedure. . Children: Do not bring children with you. . Dress appropriately: Bring dark clothing that you would not mind if they get stained. . Valuables: Do not bring any jewelry or valuables. Procedure appointments are reserved for interventional treatments only. [name] No Prescription Refills. . No medication changes will be discussed during procedure appointments. No disability issues will be discussed.  Radiofrequency Lesioning Radiofrequency lesioning is a procedure that is performed to relieve pain. The procedure is often used for back, neck, or arm pain. Radiofrequency lesioning involves the use of a machine that creates radio waves to make heat. During the procedure, the heat is applied to the nerve that carries the pain signal. The heat damages the  nerve and interferes with the pain signal. Pain relief usually starts about 2 weeks after the procedure and lasts for 6 months to 1 year. You will be awake during the procedure. You will need to be able to talk with the health care provider during the procedure. Tell a health care provider about:  Any allergies you have.  All medicines you are taking, including vitamins, herbs, eye drops, creams, and over-the-counter medicines.  Any problems you or family members have had with anesthetic medicines.  Any blood disorders you have.  Any surgeries you have had.  Any medical conditions you have or have had.  Whether you are pregnant or may be pregnant. What are the risks? Generally, this is a safe procedure. However, problems may occur, including:  Pain or soreness at the injection site.  Allergic reaction to medicines given during the procedure.  Bleeding.  Infection at the injection site.  Damage to nerves or blood vessels. What happens before the procedure? Staying hydrated Follow instructions from your health care provider about hydration, which may include:  Up to 2 hours before the procedure - you may continue to drink clear liquids, such as water, clear fruit juice, black coffee, and plain tea. Eating and drinking Follow instructions from your health care provider about eating and drinking, which may include:  8 hours before the procedure - stop eating heavy meals or foods, such as meat, fried foods, or fatty foods.  6 hours before the procedure - stop eating light meals or foods, such as toast or cereal.  6 hours before the  procedure - stop drinking milk or drinks that contain milk.  2 hours before the procedure - stop drinking clear liquids. Medicines Ask your health care provider about:  Changing or stopping your regular medicines. This is especially important if you are taking diabetes medicines or blood thinners.  Taking medicines such as aspirin and ibuprofen.  These medicines can thin your blood. Do not take these medicines unless your health care provider tells you to take them.  Taking over-the-counter medicines, vitamins, herbs, and supplements. General instructions  Plan to have someone take you home from the hospital or clinic.  If you will be going home right after the procedure, plan to have someone with you for 24 hours.  Ask your health care provider what steps will be taken to help prevent infection. These may include: ? Removing hair at the procedure site. ? Washing skin with a germ-killing soap. ? Taking antibiotic medicine. What happens during the procedure?  An IV will be inserted into one of your veins.  You will be given one or more of the following: ? A medicine to help you relax (sedative). ? A medicine to numb the area (local anesthetic).  Your health care provider will insert a radiofrequency needle into the area to be treated. This is done with the help of a type of X-ray (fluoroscopy).  A wire that carries the radio waves (electrode) will be put through the radiofrequency needle.  An electrical pulse will be sent through the electrode to verify the correct nerve that is causing your pain. You will feel a tingling sensation, and you may have muscle twitching.  The tissue around the needle tip will be heated by an electric current that comes from the radiofrequency machine. This will numb the nerves.  The needle will be removed.  A bandage (dressing) will be put on the insertion area. The procedure may vary among health care providers and hospitals.   What happens after the procedure?  Your blood pressure, heart rate, breathing rate, and blood oxygen level will be monitored until you leave the hospital or clinic.  Return to your normal activities as told by your health care provider. Ask your health care provider what activities are safe for you.  Do not drive for 24 hours if you were given a sedative during your  procedure. Summary  Radiofrequency lesioning is a procedure that is performed to relieve pain. The procedure is often used for back, neck, or arm pain.  Radiofrequency lesioning involves the use of a machine that creates radio waves to make heat.  Plan to have someone take you home from the hospital or clinic. Do not drive for 24 hours if you were given a sedative during your procedure.  Return to your normal activities as told by your health care provider. Ask your health care provider what activities are safe for you. This information is not intended to replace advice given to you by your health care provider. Make sure you discuss any questions you have with your health care provider. Document Revised: 12/08/2017 Document Reviewed: 12/08/2017 Elsevier Patient Education  [employer].

## 2020-09-10 ENCOUNTER — Telehealth: Payer: Self-pay | Admitting: Internal Medicine

## 2020-09-10 NOTE — Telephone Encounter (Signed)
Duplicate

## 2020-09-10 NOTE — Telephone Encounter (Signed)
Patient called in to follow up on Urology appointment

## 2020-09-10 NOTE — Telephone Encounter (Signed)
Advised patient on the situation and will follow up tomorrow-after 12 per patient.

## 2020-09-10 NOTE — Telephone Encounter (Signed)
[place] [facility] to follow up and spoke with Kim-Dr Tresa Moore has not reviewed message on the patient still. Maudie Mercury will send another note to follow up on this. I will call back again tomorrow to check.

## 2020-09-11 NOTE — Telephone Encounter (Signed)
Spoke with [facility], Dr Tammi Klippel is out of the office today 6/9 doing surgeries, he has surgeries tomorrow 6/10. They have upgraded messages to Dr Tammi Klippel on this patient to URGENT. He has received the messages but has not responded to any of them. They are short staffed right now as well.  Me and Ashtyn have reached out to the office to get patient in sooner. We will follow up with their office on Monday also.   We will make Dr Silvio Pate and the patient aware.  Patient advised and understands.

## 2020-09-15 DIAGNOSIS — R0689 Other abnormalities of breathing: Secondary | ICD-10-CM | POA: Diagnosis not present

## 2020-09-15 DIAGNOSIS — J8410 Pulmonary fibrosis, unspecified: Secondary | ICD-10-CM | POA: Diagnosis not present

## 2020-09-17 ENCOUNTER — Telehealth: Payer: Self-pay | Admitting: Internal Medicine

## 2020-09-17 ENCOUNTER — Encounter: Payer: Self-pay | Admitting: Student in an Organized Health Care Education/Training Program

## 2020-09-17 ENCOUNTER — Ambulatory Visit (HOSPITAL_BASED_OUTPATIENT_CLINIC_OR_DEPARTMENT_OTHER): Payer: Medicare Other | Admitting: Student in an Organized Health Care Education/Training Program

## 2020-09-17 ENCOUNTER — Other Ambulatory Visit: Payer: Self-pay

## 2020-09-17 ENCOUNTER — Ambulatory Visit
Admission: RE | Admit: 2020-09-17 | Discharge: 2020-09-17 | Disposition: A | Discharge status: Home / Self Care | Payer: Medicare Other | Source: Ambulatory Visit | Attending: Student in an Organized Health Care Education/Training Program | Admitting: Student in an Organized Health Care Education/Training Program

## 2020-09-17 VITALS — BP 160/78 | HR 77 | Temp 96.7°F | Resp 20 | Ht 72.0 in | Wt 196.0 lb

## 2020-09-17 DIAGNOSIS — G894 Chronic pain syndrome: Secondary | ICD-10-CM

## 2020-09-17 DIAGNOSIS — M47816 Spondylosis without myelopathy or radiculopathy, lumbar region: Secondary | ICD-10-CM | POA: Insufficient documentation

## 2020-09-17 LAB — TOXASSURE SELECT 13 (MW), URINE

## 2020-09-17 MED ORDER — LIDOCAINE HCL 2 % IJ SOLN
20.0000 mL | Freq: Once | INTRAMUSCULAR | Status: AC
  Administered 2020-09-17: 200 mg

## 2020-09-17 MED ORDER — DIAZEPAM 5 MG PO TABS
ORAL_TABLET | ORAL | Status: AC
  Filled 2020-09-17: qty 1

## 2020-09-17 MED ORDER — DEXAMETHASONE SODIUM PHOSPHATE 10 MG/ML IJ SOLN
10.0000 mg | Freq: Once | INTRAMUSCULAR | Status: AC
  Administered 2020-09-17: 10 mg

## 2020-09-17 MED ORDER — ROPIVACAINE HCL 2 MG/ML IJ SOLN
INTRAMUSCULAR | Status: AC
  Filled 2020-09-17: qty 20

## 2020-09-17 MED ORDER — ROPIVACAINE HCL 2 MG/ML IJ SOLN
9.0000 mL | Freq: Once | INTRAMUSCULAR | Status: AC
  Administered 2020-09-17: 20 mL via PERINEURAL

## 2020-09-17 MED ORDER — DEXAMETHASONE SODIUM PHOSPHATE 10 MG/ML IJ SOLN
INTRAMUSCULAR | Status: AC
  Filled 2020-09-17: qty 1

## 2020-09-17 MED ORDER — DIAZEPAM 5 MG PO TABS
5.0000 mg | ORAL_TABLET | Freq: Once | ORAL | Status: AC
  Administered 2020-09-17: 5 mg via ORAL

## 2020-09-17 NOTE — Progress Notes (Signed)
PROVIDER NOTE: Information contained herein reflects review and annotations entered in association with encounter. Interpretation of such information and data should be left to medically-trained personnel. Information provided to patient can be located elsewhere in the medical record under "Patient Instructions". Document created using STT-dictation technology, any transcriptional errors that may result from process are unintentional.    Patient: [name]  Service Category: Procedure  Provider: Gillis Santa, MD  DOB: 09/28/1931  DOS: 09/17/2020  Location: [facility]  MRN: [id number]  Setting: Ambulatory - outpatient  Referring Provider: [name], MD  Type: Established Patient  Specialty: Interventional Pain Management  PCP: [name], MD   Primary Reason for Visit: Interventional Pain Management Treatment. CC: Back Pain (Lumbar right )  Procedure:          Anesthesia, Analgesia, Anxiolysis:  Type: Thermal Lumbar Facet, Medial Branch Radiofrequency Ablation/Neurotomy           Primary Purpose: Therapeutic Region: Posterolateral Lumbosacral Spine Level: L3, L4, L5, Medial Branch Level(s). These levels will denervate the L3-4, L4-5,  lumbar facet joints. Laterality: Right  Type: Local + PO Valium Local Anesthetic: Lidocaine 1-2%  Position: Prone   Indications: 1. Lumbar facet arthropathy   2. Lumbar spondylosis   3. Chronic pain syndrome    [name] has been dealing with the above chronic pain for longer than three months and has either failed to respond, was unable to tolerate, or simply did not get enough benefit from other more conservative therapies including, but not limited to: 1. Over-the-counter medications 2. Anti-inflammatory medications 3. Muscle relaxants 4. Membrane stabilizers 5. Opioids 6. Physical therapy and/or chiropractic manipulation 7. Modalities (Heat, ice, etc.) 8. Invasive techniques such as nerve blocks. [name]  has attained more than 50% relief of the pain from a series of diagnostic injections conducted in separate occasions.  Pain Score: Pre-procedure: 5 /10 Post-procedure: 0-No pain/10  Pre-op H&P Assessment:  [name] is a 86 y.o. (year old), male patient, seen today for interventional treatment. He  has a past surgical history that includes Total knee arthroplasty (Right, 03/2010); Knee arthroscopy (Right, 2008); Cataract extraction w/ intraocular lens  implant, bilateral (Bilateral); Coronary artery bypass graft (11/1999); Coronary stent placement (02/2012); Shoulder arthroscopy with open rotator cuff repair and distal clavicle acrominectomy (Left, 02/27/2013); Trigger finger release (Left, 02/27/2013); Cholecystectomy open (11/2003); Joint replacement; Esophagogastroduodenoscopy (egd) with esophageal dilation (2010); Cardiac catheterization (08/2004); Cardiac catheterization (12/31/2011); Cardiac catheterization (2009); left and right heart catheterization with coronary angiogram (12/31/2011); percutaneous coronary stent intervention (pci-s) (12/31/2011); percutaneous coronary stent intervention (pci-s) (N/A, 01/03/2012); Percutaneous coronary intervention-balloon only (01/03/2012); left and right heart catheterization with coronary angiogram (N/A, 01/14/2014); Hand surgery; Cardiac catheterization (N/A, 08/13/2015); Esophagogastroduodenoscopy (N/A, 03/01/2017); maloney dilation (03/01/2017); Esophagogastroduodenoscopy (egd) with propofol (N/A, 09/12/2018); Botox injection (N/A, 09/12/2018); Balloon dilation (N/A, 09/12/2018); Coronary angioplasty with stent (01/03/2012); Coronary angioplasty with stent (01/14/2014); Esophagogastroduodenoscopy (egd) with propofol (N/A, 11/27/2018); Botox injection (N/A, 11/27/2018); Esophageal dilation (11/27/2018); Esophagogastroduodenoscopy (egd) with propofol (N/A, 02/20/2020); Botox injection (N/A, 02/20/2020); and Balloon dilation (N/A, 02/20/2020). [name] has a current medication  list which includes the following prescription(s): amlodipine, aspirin ec, one touch ultra 2, carvedilol, clopidogrel, dha-vitamin c-lutein, glucose blood, hydrocodone-acetaminophen, [START ON 10/10/2020] hydrocodone-acetaminophen, [START ON 11/09/2020] hydrocodone-acetaminophen, losartan, nitroglycerin, onetouch delica lancets 63O, oxygen-helium, pantoprazole, polyethylene glycol, pregabalin, rybelsus, tamsulosin, cephalexin, and furosemide. His primarily concern today is the Back Pain (Lumbar right )  Initial Vital Signs:  Pulse/HCG Rate: 77ECG Heart Rate: 88 Temp: (!) 96.7  F (35.9 C) Resp: 16 BP: (!) 174/88 SpO2: 97 %  BMI: Estimated body mass index is 26.58 kg/m as calculated from the following:   Height as of this encounter: 6' (1.829 m).   Weight as of this encounter: 196 lb (88.9 kg).  Risk Assessment: Allergies: Reviewed. He is allergic to doxazosin mesylate and methocarbamol.  Allergy Precautions: None required Coagulopathies: Reviewed. None identified.  Blood-thinner therapy: None at this time Active Infection(s): Reviewed. None identified. [name] is afebrile  Site Confirmation: [name] was asked to confirm the procedure and laterality before marking the site Procedure checklist: Completed Consent: Before the procedure and under the influence of no sedative(s), amnesic(s), or anxiolytics, the patient was informed of the treatment options, risks and possible complications. To fulfill our ethical and legal obligations, as recommended by the American Medical Association's Code of Ethics, I have informed the patient of my clinical impression; the nature and purpose of the treatment or procedure; the risks, benefits, and possible complications of the intervention; the alternatives, including doing nothing; the risk(s) and benefit(s) of the alternative treatment(s) or procedure(s); and the risk(s) and benefit(s) of doing nothing. The patient was provided information about the  general risks and possible complications associated with the procedure. These may include, but are not limited to: failure to achieve desired goals, infection, bleeding, organ or nerve damage, allergic reactions, paralysis, and death. In addition, the patient was informed of those risks and complications associated to Spine-related procedures, such as failure to decrease pain; infection (i.e.: Meningitis, epidural or intraspinal abscess); bleeding (i.e.: epidural hematoma, subarachnoid hemorrhage, or any other type of intraspinal or peri-dural bleeding); organ or nerve damage (i.e.: Any type of peripheral nerve, nerve root, or spinal cord injury) with subsequent damage to sensory, motor, and/or autonomic systems, resulting in permanent pain, numbness, and/or weakness of one or several areas of the body; allergic reactions; (i.e.: anaphylactic reaction); and/or death. Furthermore, the patient was informed of those risks and complications associated with the medications. These include, but are not limited to: allergic reactions (i.e.: anaphylactic or anaphylactoid reaction(s)); adrenal axis suppression; blood sugar elevation that in diabetics may result in ketoacidosis or comma; water retention that in patients with history of congestive heart failure may result in shortness of breath, pulmonary edema, and decompensation with resultant heart failure; weight gain; swelling or edema; medication-induced neural toxicity; particulate matter embolism and blood vessel occlusion with resultant organ, and/or nervous system infarction; and/or aseptic necrosis of one or more joints. Finally, the patient was informed that Medicine is not an exact science; therefore, there is also the possibility of unforeseen or unpredictable risks and/or possible complications that may result in a catastrophic outcome. The patient indicated having understood very clearly. We have given the patient no guarantees and we have made no promises.  Enough time was given to the patient to ask questions, all of which were answered to the patient's satisfaction. Mr. Seago has indicated that he wanted to continue with the procedure. Attestation: I, the ordering provider, attest that I have discussed with the patient the benefits, risks, side-effects, alternatives, likelihood of achieving goals, and potential problems during recovery for the procedure that I have provided informed consent. Date  Time: 09/17/2020  9:50 AM  Pre-Procedure Preparation:  Monitoring: As per clinic protocol. Respiration, ETCO2, SpO2, BP, heart rate and rhythm monitor placed and checked for adequate function Safety Precautions: Patient was assessed for positional comfort and pressure points before starting the procedure. Time-out: I initiated and conducted the "Time-out"  before starting the procedure, as per protocol. The patient was asked to participate by confirming the accuracy of the "Time Out" information. Verification of the correct person, site, and procedure were performed and confirmed by me, the nursing staff, and the patient. "Time-out" conducted as per Joint Commission's Universal Protocol (UP.01.01.01). Time: 1021  Description of Procedure:          Laterality: Right Levels:  L3, L4, L5, Medial Branch Level(s), at the L3-4, L4-5, lumbar facet joints. Area Prepped: Lumbosacral DuraPrep (Iodine Povacrylex [0.7% available iodine] and Isopropyl Alcohol, 74% w/w) Safety Precautions: Aspiration looking for blood return was conducted prior to all injections. At no point did we inject any substances, as a needle was being advanced. Before injecting, the patient was told to immediately notify me if he was experiencing any new onset of "ringing in the ears, or metallic taste in the mouth". No attempts were made at seeking any paresthesias. Safe injection practices and needle disposal techniques used. Medications properly checked for expiration dates. SDV (single dose  vial) medications used. After the completion of the procedure, all disposable equipment used was discarded in the proper designated medical waste containers. Local Anesthesia: Protocol guidelines were followed. The patient was positioned over the fluoroscopy table. The area was prepped in the usual manner. The time-out was completed. The target area was identified using fluoroscopy. A 12-in long, straight, sterile hemostat was used with fluoroscopic guidance to locate the targets for each level blocked. Once located, the skin was marked with an approved surgical skin marker. Once all sites were marked, the skin (epidermis, dermis, and hypodermis), as well as deeper tissues (fat, connective tissue and muscle) were infiltrated with a small amount of a short-acting local anesthetic, loaded on a 10cc syringe with a 25G, 1.5-in  Needle. An appropriate amount of time was allowed for local anesthetics to take effect before proceeding to the next step. Local Anesthetic: Lidocaine 2.0% The unused portion of the local anesthetic was discarded in the proper designated containers. Technical explanation of process:  Radiofrequency Ablation (RFA) L3 Medial Branch Nerve RFA: The target area for the L3 medial branch is at the junction of the postero-lateral aspect of the superior articular process and the superior, posterior, and medial edge of the transverse process of L4. Under fluoroscopic guidance, a Radiofrequency needle was inserted until contact was made with os over the superior postero-lateral aspect of the pedicular shadow (target area). Sensory and motor testing was conducted to properly adjust the position of the needle. Once satisfactory placement of the needle was achieved, the numbing solution was slowly injected after negative aspiration for blood. 2.0 mL of the nerve block solution was injected without difficulty or complication. After waiting for at least 3 minutes, the ablation was performed. Once completed,  the needle was removed intact. L4 Medial Branch Nerve RFA: The target area for the L4 medial branch is at the junction of the postero-lateral aspect of the superior articular process and the superior, posterior, and medial edge of the transverse process of L5. Under fluoroscopic guidance, a Radiofrequency needle was inserted until contact was made with os over the superior postero-lateral aspect of the pedicular shadow (target area). Sensory and motor testing was conducted to properly adjust the position of the needle. Once satisfactory placement of the needle was achieved, the numbing solution was slowly injected after negative aspiration for blood. 2.0 mL of the nerve block solution was injected without difficulty or complication. After waiting for at least 3 minutes, the ablation  was performed. Once completed, the needle was removed intact. L5 Medial Branch Nerve RFA: The target area for the L5 medial branch is at the junction of the postero-lateral aspect of the superior articular process of S1 and the superior, posterior, and medial edge of the sacral ala. Under fluoroscopic guidance, a Radiofrequency needle was inserted until contact was made with os over the superior postero-lateral aspect of the pedicular shadow (target area). Sensory and motor testing was conducted to properly adjust the position of the needle. Once satisfactory placement of the needle was achieved, the numbing solution was slowly injected after negative aspiration for blood. 2.0 mL of the nerve block solution was injected without difficulty or complication. After waiting for at least 3 minutes, the ablation was performed. Once completed, the needle was removed intact. Radiofrequency lesioning (ablation):  Radiofrequency Generator: NeuroTherm [id number] Sensory Stimulation Parameters: 50 Hz was used to locate & identify the nerve, making sure that the needle was positioned such that there was no sensory stimulation below 0.3 V or above 0.7  V. Motor Stimulation Parameters: 2 Hz was used to evaluate the motor component. Care was taken not to lesion any nerves that demonstrated motor stimulation of the lower extremities at an output of less than 2.5 times that of the sensory threshold, or a maximum of 2.0 V. Lesioning Technique Parameters: Standard Radiofrequency settings. (Not bipolar or pulsed.) Temperature Settings: 80 degrees C Lesioning time: 60 seconds Intra-operative Compliance: Compliant Materials & Medications: Needle(s) (Electrode/Cannula) Type: Teflon-coated, curved tip, Radiofrequency needle(s) Gauge: 22G Length: 10cm Numbing solution: 6 cc of 0.2% ROPIVACAINE , 2 cc injected at each level above after sensory-motor testing prior to lesioning. The unused portion of the solution was discarded in the proper designated containers.  Once the entire procedure was completed, the treated area was cleaned, making sure to leave some of the prepping solution back to take advantage of its long term bactericidal properties.    Illustration of the posterior view of the lumbar spine and the posterior neural structures. Laminae of L2 through S1 are labeled. DPRL5, dorsal primary ramus of L5; DPRS1, dorsal primary ramus of S1; DPR3, dorsal primary ramus of L3; FJ, facet (zygapophyseal) joint L3-L4; I, inferior articular process of L4; LB1, lateral branch of dorsal primary ramus of L1; IAB, inferior articular branches from L3 medial branch (supplies L4-L5 facet joint); IBP, intermediate branch plexus; MB3, medial branch of dorsal primary ramus of L3; NR3, third lumbar nerve root; S, superior articular process of L5; SAB, superior articular branches from L4 (supplies L4-5 facet joint also); TP3, transverse process of L3.  Vitals:   09/17/20 0952 09/17/20 1024 09/17/20 1029 09/17/20 1034  BP: (!) 174/88 (!) 168/76 (!) 160/77 (!) 160/78  Pulse: 77     Resp: 16 20 20 20   Temp: (!) 96.7 F (35.9 C)     TempSrc: Temporal     SpO2: 97% 96% 96%  95%  Weight: 196 lb (88.9 kg)     Height: 6' (1.829 m)      Start Time: 1021 hrs. End Time: 1035 hrs.  Imaging Guidance (Spinal):          Type of Imaging Technique: Fluoroscopy Guidance (Spinal) Indication(s): Assistance in needle guidance and placement for procedures requiring needle placement in or near specific anatomical locations not easily accessible without such assistance. Exposure Time: Please see nurses notes. Contrast: None used. Fluoroscopic Guidance: I was personally present during the use of fluoroscopy. "Tunnel Vision Technique" used to obtain the best possible  view of the target area. Parallax error corrected before commencing the procedure. "Direction-depth-direction" technique used to introduce the needle under continuous pulsed fluoroscopy. Once target was reached, antero-posterior, oblique, and lateral fluoroscopic projection used confirm needle placement in all planes. Images permanently stored in EMR. Interpretation: No contrast injected. I personally interpreted the imaging intraoperatively. Adequate needle placement confirmed in multiple planes. Permanent images saved into the patient's record.   Indication(s): None identified  Post-operative Assessment:  Post-procedure Vital Signs:  Pulse/HCG Rate: 7786 Temp: (!) 96.7 F (35.9 C) Resp: 20 BP: (!) 160/78 SpO2: 95 %  EBL: None  Complications: No immediate post-treatment complications observed by team, or reported by patient.  Note: The patient tolerated the entire procedure well. A repeat set of vitals were taken after the procedure and the patient was kept under observation following institutional policy, for this type of procedure. Post-procedural neurological assessment was performed, showing return to baseline, prior to discharge. The patient was provided with post-procedure discharge instructions, including a section on how to identify potential problems. Should any problems arise concerning this procedure,  the patient was given instructions to immediately contact us, at any time, without hesitation. In any case, we plan to contact the patient by telephone for a follow-up status report regarding this interventional procedure.  Comments:  No additional relevant information.  Plan of Care  Orders:  Orders Placed This Encounter  Procedures   DG [facility] C-ARM 1-60 MIN NO REPORT    Intraoperative interpretation by procedural physician at [facility].    Standing Status:   Standing    Number of Occurrences:   1    Order Specific Question:   Reason for exam:    Answer:   Assistance in needle guidance and placement for procedures requiring needle placement in or near specific anatomical locations not easily accessible without such assistance.   Chronic Opioid Analgesic:  Hydrocodone 10 to 20 mg twice daily as needed    Medications ordered for procedure: Meds ordered this encounter  Medications   lidocaine (XYLOCAINE) 2 % (with pres) injection 400 mg   ropivacaine (PF) 2 mg/mL (0.2%) (NAROPIN) injection 9 mL   dexamethasone (DECADRON) injection 10 mg   diazepam (VALIUM) tablet 5 mg   Medications administered: We administered lidocaine, ropivacaine (PF) 2 mg/mL (0.2%), dexamethasone, and diazepam.  See the medical record for exact dosing, route, and time of administration.  Follow-up plan:   Return in about 6 weeks (around 10/29/2020) for Post Procedure Evaluation, virtual.     Recent Visits Date Type Provider Dept  09/09/20 Office Visit Gillis Santa, MD [facility]  Showing recent visits within past 90 days and meeting all other requirements Today's Visits Date Type Provider Dept  09/17/20 Procedure visit Gillis Santa, MD [facility]  Showing today's visits and meeting all other requirements Future Appointments Date Type Provider Dept  09/29/20 Appointment Gillis Santa, MD [facility]  12/04/20 Appointment Gillis Santa, MD [facility]  Showing future appointments within next 90 days and meeting all other requirements Disposition: Discharge home  Discharge (Date  Time): 09/17/2020; 1045 hrs.   Primary Care Physician: [name], MD Location: [facility] Note by: Gillis Santa, MD Date: 09/17/2020; Time: 12:11 PM  Disclaimer:  Medicine is not an exact science. The only guarantee in medicine is that nothing is guaranteed. It is important to note that the decision to proceed with this intervention was based on the information collected from the  patient. The Data and conclusions were drawn from the patient's questionnaire, the interview, and the physical examination. Because the information was provided in large part by the patient, it cannot be guaranteed that it has not been purposely or unconsciously manipulated. Every effort has been made to obtain as much relevant data as possible for this evaluation. It is important to note that the conclusions that lead to this procedure are derived in large part from the available data. Always take into account that the treatment will also be dependent on availability of resources and existing treatment guidelines, considered by other Pain Management Practitioners as being common knowledge and practice, at the time of the intervention. For Medico-Legal purposes, it is also important to point out that variation in procedural techniques and pharmacological choices are the acceptable norm. The indications, contraindications, technique, and results of the above procedure should only be interpreted and judged by a Board-Certified Interventional Pain Specialist with extensive familiarity and expertise in the same exact procedure and technique.

## 2020-09-17 NOTE — Progress Notes (Signed)
Safety precautions to be maintained throughout the outpatient stay will include: orient to surroundings, keep bed in low position, maintain call bell within reach at all times, provide assistance with transfer out of bed and ambulation.  

## 2020-09-17 NOTE — Telephone Encounter (Signed)
Carmelina Peal he is going soon

## 2020-09-17 NOTE — Patient Instructions (Signed)

## 2020-09-17 NOTE — Telephone Encounter (Signed)
Mr. Maxon called in and wanted to let Dr. Silvio Pate know that he is going to see the Neurologist on 6/21

## 2020-09-18 ENCOUNTER — Telehealth: Payer: Self-pay

## 2020-09-18 NOTE — Telephone Encounter (Signed)
Post procedure phone call.  Patient states he is doing good.  

## 2020-09-19 ENCOUNTER — Ambulatory Visit (INDEPENDENT_AMBULATORY_CARE_PROVIDER_SITE_OTHER): Payer: Medicare Other | Admitting: Internal Medicine

## 2020-09-19 ENCOUNTER — Encounter: Payer: Self-pay | Admitting: Internal Medicine

## 2020-09-19 ENCOUNTER — Other Ambulatory Visit: Payer: Self-pay

## 2020-09-19 DIAGNOSIS — N3289 Other specified disorders of bladder: Secondary | ICD-10-CM | POA: Diagnosis not present

## 2020-09-19 DIAGNOSIS — E1142 Type 2 diabetes mellitus with diabetic polyneuropathy: Secondary | ICD-10-CM | POA: Diagnosis not present

## 2020-09-19 LAB — POCT GLYCOSYLATED HEMOGLOBIN (HGB A1C): Hemoglobin A1C: 7.9 % — AB (ref 4.0–5.6)

## 2020-09-19 NOTE — Patient Instructions (Signed)
Please continue to use your cane or walker all the time for stability.

## 2020-09-19 NOTE — Progress Notes (Signed)
Subjective:    Patient ID: [name], male    DOB: 22-Jun-1931, 87 y.o.   MRN: [id number]  HPI Here with wife for follow up of diabetes This visit occurred during the SARS-CoV-2 public health emergency.  Safety protocols were in place, including screening questions prior to the visit, additional usage of staff PPE, and extensive cleaning of exam room while observing appropriate contact time as indicated for disinfecting solutions.   Did get the appt with urology  Sugar 247 this morning 280 yesterday Has tolerated the semaglutide--but no change in appetite Overall, doesn't eat as much as in the past Weight is stable  Current Outpatient Medications on File Prior to Visit  Medication Sig Dispense Refill   amLODipine (NORVASC) 5 MG tablet Take 5 mg by mouth daily.      aspirin EC 81 MG tablet Take 81 mg by mouth at bedtime.     Blood Glucose Monitoring Suppl (ONE TOUCH ULTRA 2) w/Device KIT Use to obtain blood sugar daily. Dx Code E11.40 1 kit 0   carvedilol (COREG) 6.25 MG tablet Take 6.25 mg by mouth 2 (two) times daily.     clopidogrel (PLAVIX) 75 MG tablet Take 1 tablet (75 mg total) by mouth daily. 90 tablet 3   DHA-Vitamin C-Lutein (EYE HEALTH FORMULA PO) Take 1 tablet by mouth daily.      glucose blood (ONE TOUCH ULTRA TEST) test strip USE TO CHECK BLOOD SUGAR ONCE A DAY Dx Code E11.40 100 each 3   HYDROcodone-acetaminophen (NORCO) 10-325 MG tablet Take 1-2 tablets by mouth 2 (two) times daily as needed for severe pain. Must last 30 days. 120 tablet 0   [START ON 10/10/2020] HYDROcodone-acetaminophen (NORCO) 10-325 MG tablet Take 1-2 tablets by mouth 2 (two) times daily as needed for severe pain. Must last 30 days. 120 tablet 0   [START ON 11/09/2020] HYDROcodone-acetaminophen (NORCO) 10-325 MG tablet Take 1-2 tablets by mouth 2 (two) times daily as needed for severe pain. Must last 30 days. 120 tablet 0   losartan (COZAAR) 25 MG tablet Take 25 mg by mouth daily.     nitroGLYCERIN  (NITROSTAT) 0.4 MG SL tablet DISSOLVE 1 TABLET UNDER TONGUE AS NEEDEDFOR CHEST PAIN. MAY REPEAT 5 MINUTES APART 3 TIMES IF NEEDED. IF NO RELIEF CALL 911 25 tablet 0   OneTouch Delica Lancets 70W MISC 1 each by In Vitro route daily. Dx Code E11.49 100 each 3   OXYGEN Inhale into the lungs. Per pt- Uses Oxygen 2 liters at night.     pantoprazole (PROTONIX) 40 MG tablet TAKE 1 TABLET BY MOUTH TWICE (2) DAILY 180 tablet 3   polyethylene glycol (MIRALAX / GLYCOLAX) 17 g packet Take 51 g by mouth every other day.      pregabalin (LYRICA) 50 MG capsule Take 1 capsule (50 mg total) by mouth at bedtime. 90 capsule 0   Semaglutide (RYBELSUS) 3 MG TABS Take 1 tablet by mouth daily. 30 tablet 11   tamsulosin (FLOMAX) 0.4 MG CAPS capsule Take 0.4 mg by mouth at bedtime. Takes Monday, Wednesday, Friday     furosemide (LASIX) 40 MG tablet Take 1 tablet (40 mg total) by mouth every Monday, Wednesday, and Friday. 12 tablet 0   No current facility-administered medications on file prior to visit.    Allergies  Allergen Reactions   Doxazosin Mesylate Other (See Comments)    dizziness   Methocarbamol Rash    Past Medical History:  Diagnosis Date   Arthritis  osteoarthritis, s/p R TKR, and digits   CAD (coronary artery disease)    a. s/p CABG (2001)  b. s/p DES to RCA and cutting POBA to ostial PDA (2013)   c. s/p DES to SVG to OM2 (01/14/14) d. cath: 08/2015 NSTEMI w/ patent LIMA-LAD and 99% stenosis of SVG-OM w/ DES placed. CTO of SVG-RCA and SVG-D1.    Cataract    Chronic diastolic CHF (congestive heart failure) ([place])    a) 09/13 ECHO- LVEF 16-10%, grade 1 diastolic dysfunction, mild LA dilatation, atrial septal aneurysm, AV mobility restricted, but no sig AS by doppler; b) 09/04/08 ECHO- LVH, ef 60%, mild AS, c. echo 08/2015: EF perserved of 55-60% with inferolateral HK. Mild AS noted.   Chronic kidney disease, stage III (moderate) ([name])    Chronic lower back pain    Colon polyps    COVID-19     Diverticulosis    Dyspnea 2009 since July -Sept   05/06/08-CPST-  normal effort, reduced VO2 max 20.5 /65%, reduced at 8.2/ 40%, normal breathing resetvca of 55%, submaximal heart rate response 112/77%, flattened o2 pluse response at peak exercise-12 ml/beat @ 85%, No VQ mismatch abnormalities, All c/w CIRC Limitation   Enlarged prostate    Esophageal stricture    a. s/p dilation spring 2010   GERD (gastroesophageal reflux disease)    Heart murmur    Hiatal hernia    History of carpal tunnel syndrome    Bilateral   History of kidney stones    History of PFTs    mixed pattern on spiro. mild restn on lung volumes with near normal DLCO. Pattern can be explained by CABG scar. Fev1 2.2L/73%, ratio 68 (67), TLC 4.7/68%,RV 1.5L/55%,DLCO 79%   Hyperlipidemia    Hypertension    Interstitial lung disease ([name])    NOS   Iron deficiency anemia    Nausea & vomiting    2018/2019   On home oxygen therapy    2 L [place] at bedtime   Osteoporosis    Overweight (BMI 25.0-29.9)    BMI 29   Peripheral neuropathy    RA (rheumatoid arthritis) ([place])    [name]   Seropositive rheumatoid arthritis ([place])    Type II diabetes mellitus ([place])    diet controlled   Wears glasses    Wears partial dentures    upper    Past Surgical History:  Procedure Laterality Date   BALLOON DILATION N/A 09/12/2018   Procedure: BALLOON DILATION;  Surgeon: Irene Shipper, MD;  Location: [facility];  Service: Endoscopy;  Laterality: N/A;   BALLOON DILATION N/A 02/20/2020   Procedure: BALLOON DILATION;  Surgeon: Irene Shipper, MD;  Location: [facility];  Service: Endoscopy;  Laterality: N/A;   BOTOX INJECTION N/A 09/12/2018   Procedure: BOTOX INJECTION;  Surgeon: Irene Shipper, MD;  Location: [facility];  Service: Endoscopy;  Laterality: N/A;   BOTOX INJECTION N/A 11/27/2018   Procedure: BOTOX INJECTION;  Surgeon: Irene Shipper, MD;  Location: [facility];  Service: Endoscopy;  Laterality: N/A;   BOTOX INJECTION N/A  02/20/2020   Procedure: BOTOX INJECTION;  Surgeon: Irene Shipper, MD;  Location: [facility];  Service: Endoscopy;  Laterality: N/A;   CARDIAC CATHETERIZATION  08/2004   CP- no MI, Cath- small vessell disease    CARDIAC CATHETERIZATION  12/31/2011   80% distal LM, 100% native LAD, LCx and RCA, 30% prox SVG-OM, SVG-D1 normal, 99% distal, 80% ostial SVG-RCA distal to graft, LIMA-LAD normal; LVEF mildly  decreased with posterior basal AK    CARDIAC CATHETERIZATION  2009   with patent grafts/notes 12/31/2011   CARDIAC CATHETERIZATION N/A 08/13/2015   Procedure: Left Heart Cath and Cors/Grafts Angiography;  Surgeon: Tonny Bollman, MD;  Location: [facility] INVASIVE CV LAB;  Service: Cardiovascular;  Laterality: N/A;   CATARACT EXTRACTION W/ INTRAOCULAR LENS  IMPLANT, BILATERAL Bilateral    CHOLECYSTECTOMY OPEN  11/2003   Christella Hartigan   CORONARY ANGIOPLASTY WITH STENT PLACEMENT  01/03/2012   Successful DES to SVG-RCA and cutting balloon angioplasty ostial  PDA    CORONARY ANGIOPLASTY WITH STENT PLACEMENT  01/14/2014   "1"   CORONARY ARTERY BYPASS GRAFT  11/1999   CABG X5   CORONARY STENT PLACEMENT  02/2012   1 stent and balloon   ESOPHAGEAL DILATION  11/27/2018   Procedure: ESOPHAGEAL DILATION;  Surgeon: Hilarie Fredrickson, MD;  Location: [facility];  Service: Endoscopy;;   ESOPHAGOGASTRODUODENOSCOPY N/A 03/01/2017   Procedure: ESOPHAGOGASTRODUODENOSCOPY (EGD);  Surgeon: Hilarie Fredrickson, MD;  Location: [facility];  Service: Endoscopy;  Laterality: N/A;   ESOPHAGOGASTRODUODENOSCOPY (EGD) WITH ESOPHAGEAL DILATION  2010   ESOPHAGOGASTRODUODENOSCOPY (EGD) WITH PROPOFOL N/A 09/12/2018   Procedure: ESOPHAGOGASTRODUODENOSCOPY (EGD) WITH PROPOFOL;  Surgeon: Hilarie Fredrickson, MD;  Location: [facility];  Service: Endoscopy;  Laterality: N/A;   ESOPHAGOGASTRODUODENOSCOPY (EGD) WITH PROPOFOL N/A 11/27/2018   Procedure: ESOPHAGOGASTRODUODENOSCOPY (EGD) WITH PROPOFOL, WITH BALLOON DILATION;  Surgeon: Hilarie Fredrickson, MD;  Location: [facility];  Service: Endoscopy;  Laterality: N/A;   ESOPHAGOGASTRODUODENOSCOPY (EGD) WITH PROPOFOL N/A 02/20/2020   Procedure: ESOPHAGOGASTRODUODENOSCOPY (EGD) WITH PROPOFOL;  Surgeon: Hilarie Fredrickson, MD;  Location: [facility];  Service: Endoscopy;  Laterality: N/A;   HAND SURGERY     bilateral carpal tunnel releases   JOINT REPLACEMENT     KNEE ARTHROSCOPY Right 2008   LEFT AND RIGHT HEART CATHETERIZATION WITH CORONARY ANGIOGRAM  12/31/2011   Procedure: LEFT AND RIGHT HEART CATHETERIZATION WITH CORONARY ANGIOGRAM;  Surgeon: Kathleene Hazel, MD;  Location: [facility] CATH LAB;  Service: Cardiovascular;;   LEFT AND RIGHT HEART CATHETERIZATION WITH CORONARY ANGIOGRAM N/A 01/14/2014   Procedure: LEFT AND RIGHT HEART CATHETERIZATION WITH CORONARY ANGIOGRAM;  Surgeon: Peter M Swaziland, MD;  Location: [facility] CATH LAB;  Service: Cardiovascular;  Laterality: N/A;   MALONEY DILATION  03/01/2017   Procedure: Elease Hashimoto DILATION;  Surgeon: Hilarie Fredrickson, MD;  Location: [facility];  Service: Endoscopy;;   PERCUTANEOUS CORONARY INTERVENTION-BALLOON ONLY  01/03/2012   Procedure: PERCUTANEOUS CORONARY INTERVENTION-BALLOON ONLY;  Surgeon: Peter M Swaziland, MD;  Location: [facility] CATH LAB;  Service: Cardiovascular;;   PERCUTANEOUS CORONARY STENT INTERVENTION (PCI-S)  12/31/2011   Procedure: PERCUTANEOUS CORONARY STENT INTERVENTION (PCI-S);  Surgeon: Kathleene Hazel, MD;  Location: [facility] CATH LAB;  Service: Cardiovascular;;   PERCUTANEOUS CORONARY STENT INTERVENTION (PCI-S) N/A 01/03/2012   Procedure: PERCUTANEOUS CORONARY STENT INTERVENTION (PCI-S);  Surgeon: Peter M Swaziland, MD;  Location: [facility] CATH LAB;  Service: Cardiovascular;  Laterality: N/A;   SHOULDER ARTHROSCOPY WITH OPEN ROTATOR CUFF REPAIR AND DISTAL CLAVICLE ACROMINECTOMY Left 02/27/2013   Procedure: LEFT SHOULDER ARTHROSCOPY WITH MINI OPEN ROTATOR CUFF REPAIR AND SUBACROMIAL DECOMPRESSION AND DISTAL CLAVICLE RESECTION;  Surgeon: Valeria Batman, MD;  Location: [facility]  OR;  Service: Orthopedics;  Laterality: Left;   TOTAL KNEE ARTHROPLASTY Right 03/2010   Dr Brynda Greathouse   TRIGGER FINGER RELEASE Left 02/27/2013   Procedure: RELEASE TRIGGER FINGER/A-1 PULLEY;  Surgeon: Valeria Batman, MD;  Location: [facility] OR;  Service: Orthopedics;  Laterality: Left;  Family History  Problem Relation Age of Onset   COPD Mother    Heart disease Father    Heart attack Father    Stomach cancer Brother    Stroke Sister    Alcohol abuse Sister    Colon cancer Brother 7   Diabetes Brother    Rectal cancer Neg Hx     Social History   Socioeconomic History   Marital status: Married    Spouse name: Not on file   Number of children: 3   Years of education: Not on file   Highest education level: Not on file  Occupational History   Occupation: Designer, jewellery: RETIRED    Comment: retired  Tobacco Use   Smoking status: Former    Packs/day: 1.00    Years: 20.00    Pack years: 20.00    Types: Cigarettes    Quit date: 04/06/1963    Years since quitting: 57.4   Smokeless tobacco: Former  Scientific laboratory technician Use: Never used  Substance and Sexual Activity   Alcohol use: No    Alcohol/week: 0.0 standard drinks    Comment: 01/01/2012 "last alcohol ~ 50 yr ago"   Drug use: No   Sexual activity: Not Currently  Other Topics Concern   Not on file  Social History Narrative   No living will   Requests wife as health care POA-- alternate is daughter Hassan Rowan   Discussed DNR --he requests this (done 08/29/12)   Not sure about feeding tube---but might accept for some time   Patient lives with wife and daughter in a one story home.  Has 3 children.  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   Social Determinants of Health   Financial Resource Strain: Low Risk    Difficulty of Paying Living Expenses: Not very hard  Food Insecurity: Not on file  Transportation Needs: Not on file  Physical Activity: Not on file  Stress: Not on file  Social Connections:  Not on file  Intimate Partner Violence: Not on file   Review of Systems Had recent procedure--nerve burning for pain---seems to have helped Walking with cane    Objective:   Physical Exam Constitutional:      Appearance: Normal appearance.  Neurological:     Mental Status: He is alert.           Assessment & Plan:

## 2020-09-19 NOTE — Assessment & Plan Note (Signed)
Has urology appt next week Will need cysto/biopsy

## 2020-09-19 NOTE — Assessment & Plan Note (Addendum)
Has been on the semaglutide about a month ago No other Rx  Lab Results  Component Value Date   HGBA1C 7.9 (A) 09/19/2020   Control is much better now and acceptable Will hold off on increase of the semaglutide

## 2020-09-22 ENCOUNTER — Other Ambulatory Visit: Payer: Self-pay

## 2020-09-22 ENCOUNTER — Encounter: Payer: Self-pay | Admitting: Podiatry

## 2020-09-22 ENCOUNTER — Ambulatory Visit: Payer: Medicare Other | Admitting: Podiatry

## 2020-09-22 DIAGNOSIS — B351 Tinea unguium: Secondary | ICD-10-CM

## 2020-09-22 DIAGNOSIS — M216X2 Other acquired deformities of left foot: Secondary | ICD-10-CM

## 2020-09-22 DIAGNOSIS — L84 Corns and callosities: Secondary | ICD-10-CM

## 2020-09-22 DIAGNOSIS — M79674 Pain in right toe(s): Secondary | ICD-10-CM

## 2020-09-22 DIAGNOSIS — N184 Chronic kidney disease, stage 4 (severe): Secondary | ICD-10-CM

## 2020-09-22 DIAGNOSIS — M79675 Pain in left toe(s): Secondary | ICD-10-CM | POA: Diagnosis not present

## 2020-09-22 DIAGNOSIS — E1142 Type 2 diabetes mellitus with diabetic polyneuropathy: Secondary | ICD-10-CM

## 2020-09-22 NOTE — Progress Notes (Signed)
This patient returns to my office for at risk foot care.  This patient requires this care by a professional since this patient will be at risk due to having neuropathy, CKD and diabetes.  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     This patient has a painful callus on the outside ball of his left foot.  No self treatment or professional care was noted. Patient also requests nail care today. This patient presents for at risk foot care today. ° °General Appearance  Alert, conversant and in no acute stress. ° °Vascular  Dorsalis pedis and posterior tibial  pulses are weakly  palpable  bilaterally.  Capillary return is within normal limits  bilaterally. Cold feet bilaterally. Absent digital hair  B/L. ° °Neurologic  Senn-Weinstein monofilament wire test absent  bilaterally. Muscle power within normal limits bilaterally. ° °Nails Thick disfigured discolored nails with subungual debris  from hallux to fifth toes bilaterally. No evidence of bacterial infection or drainage bilaterally. ° °Orthopedic  No limitations of motion  feet .  No crepitus or effusions noted.  No bony pathology or digital deformities noted. Plantar flexed fifth metatarsal left foot. ° °Skin  normotropic skin with no porokeratosis noted bilaterally.  No signs of infections or ulcers noted.   Callus sub 5th metatarsal left foot. ° °Callus due to plantarflexed fifth metatarsal left foot.  Onychomycosis ° °Consent was obtained for treatment procedures.   Debridement of callus with # 15 blade.  Debride nails with nail nipper followed by dremel usage. ° ° °Return office visit    12 weeks                Told patient to return for periodic foot care and evaluation due to potential at risk complications. ° ° °[name] DPM   °

## 2020-09-23 DIAGNOSIS — R310 Gross hematuria: Secondary | ICD-10-CM | POA: Diagnosis not present

## 2020-09-23 DIAGNOSIS — D414 Neoplasm of uncertain behavior of bladder: Secondary | ICD-10-CM | POA: Diagnosis not present

## 2020-09-24 DIAGNOSIS — E119 Type 2 diabetes mellitus without complications: Secondary | ICD-10-CM | POA: Diagnosis not present

## 2020-09-25 ENCOUNTER — Other Ambulatory Visit: Payer: Self-pay | Admitting: Urology

## 2020-09-25 ENCOUNTER — Telehealth: Payer: Self-pay | Admitting: Physician Assistant

## 2020-09-25 NOTE — Telephone Encounter (Signed)
   Name: [name]  DOB: 08/02/1931  MRN: [id number]   Primary Cardiologist: [name], MD  Chart reviewed as part of pre-operative protocol coverage. Patient was contacted 09/25/2020 in reference to pre-operative risk assessment for pending surgery as outlined below.  [name] was last seen in Jan 2022 by Dr. Johnsie Cancel.  Since that day, [name] has done well. He has a remote history of CABG with subsequent PCI. Per Dr. Johnsie Cancel, [place] to hold antiplatelets as requested and to proceed with bladder surgery.   Therefore, based on ACC/AHA guidelines, the patient would be at acceptable risk for the planned procedure without further cardiovascular testing.   The patient was advised that if he develops new symptoms prior to surgery to contact our office to arrange for a follow-up visit, and he verbalized understanding.  I will route this recommendation to the requesting party via Epic fax function and remove from pre-op pool. Please call with questions.  Tami Lin Margene Cherian, PA 09/25/2020, 4:32 PM

## 2020-09-25 NOTE — Telephone Encounter (Signed)
   [facility] Pre-operative Risk Assessment    Patient Name: [name]  DOB: December 17, 1931  MRN: [id number]   HEARTCARE STAFF: - Please ensure there is not already an duplicate clearance open for this procedure. - Under Visit Info/Reason for Call, type in Other and utilize the format Clearance MM/DD/YY or Clearance TBD. Do not use dashes or single digits. - If request is for dental extraction, please clarify the # of teeth to be extracted. - If the patient is currently at the dentist's office, call Pre-Op APP to address. If the patient is not currently in the dentist office, please route to the Pre-Op pool  Request for surgical clearance:  What type of surgery is being performed? Bladder tumor removed   When is this surgery scheduled? 10/08/20   What type of clearance is required (medical clearance vs. Pharmacy clearance to hold med vs. Both)? Both   Are there any medications that need to be held prior to surgery and how long? Aspirin for 5 days; Plavix for 1 week prior to surgery   Practice name and name of physician performing surgery? [facility]; Dr. Jacalyn Lefevre   What is the office phone number? [phone] Ext [phone]   7.   What is the office fax number? [phone]  8.   Anesthesia type (None, local, MAC, general) ? General    Phillip Pough 09/25/2020, 9:27 AM  _________________________________________________________________   (provider comments below)

## 2020-09-25 NOTE — Telephone Encounter (Signed)
Dr. Johnsie Cancel This patient has a hx of CABG in 2001 with subsequent PCI, last intervention was 2017. He was hospitalized with CP in Jan 2022, felt to be noncardiac (flat CE, no RWMA on echo). You saw him just after that admission with no changes to medical therapy.  We are asked for clearance for bladder tumor resection as well as holding ASA x 5 days and plavix x 7 days.  OK to hold antiplatelets?

## 2020-09-26 ENCOUNTER — Other Ambulatory Visit: Payer: Self-pay

## 2020-09-26 ENCOUNTER — Encounter (HOSPITAL_COMMUNITY)
Admission: RE | Admit: 2020-09-26 | Discharge: 2020-09-26 | Disposition: A | Discharge status: Home / Self Care | Payer: Medicare Other | Source: Ambulatory Visit | Attending: Anesthesiology | Admitting: Anesthesiology

## 2020-09-26 ENCOUNTER — Encounter (HOSPITAL_COMMUNITY): Payer: Self-pay

## 2020-09-26 DIAGNOSIS — Z01812 Encounter for preprocedural laboratory examination: Secondary | ICD-10-CM | POA: Diagnosis not present

## 2020-09-26 NOTE — Patient Instructions (Addendum)
DUE TO COVID-19 ONLY ONE VISITOR IS ALLOWED TO COME WITH YOU AND STAY IN THE WAITING ROOM ONLY DURING PRE OP AND PROCEDURE DAY OF SURGERY.    TWO VISITOR  MAY VISIT WITH YOU AFTER SURGERY IN YOUR PRIVATE ROOM DURING VISITING HOURS ONLY!  YOU NEED TO HAVE A COVID 19 TEST ON__   NOT needed_____ @_______ , THIS TEST MUST BE DONE BEFORE SURGERY,  COVID TESTING SITE [address] [address], IT IS ON THE RIGHT GOING OUT [address] APPROXIMATELY  2 MINUTES PAST [employer] ON THE RIGHT. ONCE YOUR COVID TEST IS COMPLETED,  PLEASE BEGIN THE QUARANTINE INSTRUCTIONS AS OUTLINED IN YOUR HANDOUT.                [name]  09/26/2020   Your procedure is scheduled on: 10-08-20   Report to [facility] Main  Entrance   Report to admitting at      1030 AM     Call this number if you have problems the morning of surgery [phone]    Remember: Do not eat food  :After Midnight. You may have clear liquids until 0930 am then nothing by mouth     CLEAR LIQUID DIET   Foods Allowed                                                                     Foods Excluded water Black Coffee and tea, regular and decaf                             liquids that you cannot  Plain Jell-O any favor except red or purple                                           see through such as: Fruit ices (not with fruit pulp)                                                      milk, soups, orange juice  Iced Popsicles                                                         All solid food Carbonated beverages, regular and diet                                    Cranberry, grape and apple juices Sports drinks like Gatorade Lightly seasoned clear broth or consume(fat free) Sugar, honey syrup  _____________________________________________________________________    BRUSH YOUR TEETH MORNING OF SURGERY AND RINSE YOUR MOUTH OUT, NO CHEWING GUM CANDY OR MINTS.     Take these medicines  the morning of  surgery with A SIP OF WATER: pantoprazole, carvedilol, amlodipine, tylenol if needed  DO NOT TAKE ANY DIABETIC MEDICATIONS DAY OF YOUR SURGERY                               You may not have any metal on your body including hair pins and              piercings  Do not wear jewelry, otions, powders or perfumes, deodorant                       Men may shave face and neck.   Do not bring valuables to the hospital. [place].  Contacts, dentures or bridgework may not be worn into surgery.       Patients discharged the day of surgery will not be allowed to drive home. IF YOU ARE HAVING SURGERY AND GOING HOME THE SAME DAY, YOU MUST HAVE AN ADULT TO DRIVE YOU HOME AND BE WITH YOU FOR 24 HOURS. YOU MAY GO HOME BY TAXI OR UBER OR ORTHERWISE, BUT AN ADULT MUST ACCOMPANY YOU HOME AND STAY WITH YOU FOR 24 HOURS.  Name and phone number of your driver:  Special Instructions: N/A              Please read over the following fact sheets you were given: _____________________________________________________________________             [facility] - Preparing for Surgery Before surgery, you can play an important role.  Because skin is not sterile, your skin needs to be as free of germs as possible.  You can reduce the number of germs on your skin by washing with CHG (chlorahexidine gluconate) soap before surgery.  CHG is an antiseptic cleaner which kills germs and bonds with the skin to continue killing germs even after washing. Please DO NOT use if you have an allergy to CHG or antibacterial soaps.  If your skin becomes reddened/irritated stop using the CHG and inform your nurse when you arrive at Short Stay. Do not shave (including legs and underarms) for at least 48 hours prior to the first CHG shower.  You may shave your face/neck. Please follow these instructions carefully:  1.  Shower with CHG Soap the night before surgery and the  morning  of Surgery.  2.  If you choose to wash your hair, wash your hair first as usual with your  normal  shampoo.  3.  After you shampoo, rinse your hair and body thoroughly to remove the  shampoo.                           4.  Use CHG as you would any other liquid soap.  You can apply chg directly  to the skin and wash                       Gently with a scrungie or clean washcloth.  5.  Apply the CHG Soap to your body ONLY FROM THE NECK DOWN.   Do not use on face/ open                           Wound or open sores.  Avoid contact with eyes, ears mouth and genitals (private parts).                       Wash face,  Genitals (private parts) with your normal soap.             6.  Wash thoroughly, paying special attention to the area where your surgery  will be performed.  7.  Thoroughly rinse your body with warm water from the neck down.  8.  DO NOT shower/wash with your normal soap after using and rinsing off  the CHG Soap.                9.  Pat yourself dry with a clean towel.            10.  Wear clean pajamas.            11.  Place clean sheets on your bed the night of your first shower and do not  sleep with pets. Day of Surgery : Do not apply any lotions/deodorants the morning of surgery.  Please wear clean clothes to the hospital/surgery center.  FAILURE TO FOLLOW THESE INSTRUCTIONS MAY RESULT IN THE CANCELLATION OF YOUR SURGERY PATIENT SIGNATURE_________________________________  NURSE SIGNATURE__________________________________  ________________________________________________________________________ How to Manage Your Diabetes Before and After Surgery  Why is it important to control my blood sugar before and after surgery? Improving blood sugar levels before and after surgery helps healing and can limit problems. A way of improving blood sugar control is eating a healthy diet by:  Eating less sugar and carbohydrates  Increasing activity/exercise  Talking with your doctor about reaching  your blood sugar goals High blood sugars (greater than 180 mg/dL) can raise your risk of infections and slow your recovery, so you will need to focus on controlling your diabetes during the weeks before surgery. Make sure that the doctor who takes care of your diabetes knows about your planned surgery including the date and location.  How do I manage my blood sugar before surgery? Check your blood sugar at least 4 times a day, starting 2 days before surgery, to make sure that the level is not too high or low. Check your blood sugar the morning of your surgery when you wake up and every 2 hours until you get to the Short Stay unit. If your blood sugar is less than 70 mg/dL, you will need to treat for low blood sugar: Do not take insulin. Treat a low blood sugar (less than 70 mg/dL) with  cup of clear juice (cranberry or apple), 4 glucose tablets, OR glucose gel. Recheck blood sugar in 15 minutes after treatment (to make sure it is greater than 70 mg/dL). If your blood sugar is not greater than 70 mg/dL on recheck, call [phone] for further instructions. Report your blood sugar to the short stay nurse when you get to Short Stay.  If you are admitted to the hospital after surgery: Your blood sugar will be checked by the staff and you will probably be given insulin after surgery (instead of oral diabetes medicines) to make sure you have good blood sugar levels. The goal for blood sugar control after surgery is 80-180 mg/dL.   WHAT DO I DO ABOUT MY DIABETES MEDICATION?  Do not take oral diabetes medicines (pills) the morning of surgery.

## 2020-09-26 NOTE — Progress Notes (Addendum)
PCP - Viviana Simpler lov 09-19-20 epic Cardiologist - Clearance Fabian Sharp PA 09-25-20 epic  PPM/ICD -  Device Orders -  Rep Notified -   Chest x-ray - 04-10-20 EKG - 05-01-20  Stress Test -  ECHO - 04-11-20 Cardiac Cath -  HGBA1c-- 09-19-20 7.9 epic  Sleep Study -  CPAP -   Fasting Blood Sugar -  Checks Blood Sugar _____ times a day  Blood Thinner Instructions:plavix stop 7 days 81 mg asa hols 5 days  Aspirin Instructions:  ERAS Protcol - PRE-SURGERY Ensure or G2-   COVID TEST- Ambulatory  Activity--Able to walk to mailbox without SOB  Anesthesia review: CABG 2001 , CHF,DM II, CKD III, murmur, mild aortic stenosis, RA, HTN, Wears o2 2L hs, interstial lung disease  Patient denies shortness of breath, fever, cough and chest pain at PAT appointment   All instructions explained to the patient, with a verbal understanding of the material. Patient agrees to go over the instructions while at home for a better understanding. Patient also instructed to self quarantine after being tested for COVID-19. The opportunity to ask questions was provided.

## 2020-09-26 NOTE — Telephone Encounter (Signed)
S/w pt and he has been made aware of medications instructions for his blood thinners, ASA and Plavix. Pt aware hold ASA x 5 days prior to surgery and Plavix x 7 days prior to his surgery. Pt thanked me for the call and the help. I will re-send clearance notes now that I have s/w the pt as well.

## 2020-09-26 NOTE — Telephone Encounter (Signed)
Left message for pt to call back and ask for me Arbie Cookey with pre op) so that I may go over his instructions for his blood thinners. I will send notes to surgeon's office.

## 2020-09-29 ENCOUNTER — Ambulatory Visit
Payer: Medicare Other | Attending: Student in an Organized Health Care Education/Training Program | Admitting: Student in an Organized Health Care Education/Training Program

## 2020-09-29 ENCOUNTER — Other Ambulatory Visit: Payer: Self-pay

## 2020-09-29 ENCOUNTER — Encounter (HOSPITAL_COMMUNITY)
Admission: RE | Admit: 2020-09-29 | Discharge: 2020-09-29 | Disposition: A | Discharge status: Home / Self Care | Payer: Medicare Other | Source: Ambulatory Visit | Attending: Urology | Admitting: Urology

## 2020-09-29 ENCOUNTER — Encounter: Payer: Self-pay | Admitting: Student in an Organized Health Care Education/Training Program

## 2020-09-29 DIAGNOSIS — G894 Chronic pain syndrome: Secondary | ICD-10-CM

## 2020-09-29 DIAGNOSIS — M47816 Spondylosis without myelopathy or radiculopathy, lumbar region: Secondary | ICD-10-CM

## 2020-09-29 DIAGNOSIS — Z01812 Encounter for preprocedural laboratory examination: Secondary | ICD-10-CM | POA: Insufficient documentation

## 2020-09-29 LAB — BASIC METABOLIC PANEL
Anion gap: 6 (ref 5–15)
BUN: 35 mg/dL — ABNORMAL HIGH (ref 8–23)
CO2: 29 mmol/L (ref 22–32)
Calcium: 9.4 mg/dL (ref 8.9–10.3)
Chloride: 101 mmol/L (ref 98–111)
Creatinine, Ser: 1.91 mg/dL — ABNORMAL HIGH (ref 0.61–1.24)
GFR, Estimated: 34 mL/min — ABNORMAL LOW (ref >60)
Glucose, Bld: 185 mg/dL — ABNORMAL HIGH (ref 70–99)
Potassium: 4.9 mmol/L (ref 3.5–5.1)
Sodium: 136 mmol/L (ref 135–145)

## 2020-09-29 LAB — CBC
HCT: 40.2 % (ref 39.0–52.0)
Hemoglobin: 13 g/dL (ref 13.0–17.0)
MCH: 27.6 pg (ref 26.0–34.0)
MCHC: 32.3 g/dL (ref 30.0–36.0)
MCV: 85.4 fL (ref 80.0–100.0)
Platelets: 197 10*3/uL (ref 150–400)
RBC: 4.71 MIL/uL (ref 4.22–5.81)
RDW: 13.5 % (ref 11.5–15.5)
WBC: 9 10*3/uL (ref 4.0–10.5)
nRBC: 0 % (ref 0.0–0.2)

## 2020-09-29 LAB — GLUCOSE, CAPILLARY: Glucose-Capillary: 186 mg/dL — ABNORMAL HIGH (ref 70–99)

## 2020-09-29 NOTE — Progress Notes (Signed)
Lab. Results: Cr: 1.91

## 2020-09-29 NOTE — Assessment & Plan Note (Signed)
Status post right L3, L4, L5 lumbar radiofrequency ablation on 09/17/2020.  75% pain relief in regards to his low back and right hip and right buttock pain.  He states that he is able to ambulate with less pain and that he is doing much better from a overall chronic pain standpoint since his RFA.  We will continue to to monitor his symptoms.  Can consider repeating RFA and as early as 6 months if he does have increased low back or right buttock pain.

## 2020-09-29 NOTE — Progress Notes (Signed)
Patient: [name]  Service Category: E/M  Provider: Gillis Santa, MD  DOB: [birth year]  DOS: 09/29/2020  Location: Office  MRN: [id number]  Setting: Ambulatory outpatient  Referring Provider: [name], MD  Type: Established Patient  Specialty: Interventional Pain Management  PCP: [name], MD  Location: Remote location  Delivery: TeleHealth     Virtual Encounter - Pain Management PROVIDER NOTE: Information contained herein reflects review and annotations entered in association with encounter. Interpretation of such information and data should be left to medically-trained personnel. Information provided to patient can be located elsewhere in the medical record under "Patient Instructions". Document created using STT-dictation technology, any transcriptional errors that may result from process are unintentional.    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 [place] [address] Phone: [phone] Fax: [phone]   Pre-screening  [name] offered "in-person" vs "virtual" encounter. He indicated preferring virtual for this encounter.   Reason COVID-19*  Social distancing based on CDC and AMA recommendations.   I contacted [name] on 09/29/2020 via video conference.      I clearly identified myself as Gillis Santa, MD. I verified that I was speaking with the correct person using two identifiers (Name: [name], and date of birth: 11/19/1931).  Consent I sought verbal advanced consent from [name] for virtual visit interactions. I informed [name] of possible security and privacy concerns, risks, and limitations  associated with providing "not-in-person" medical evaluation and management services. I also informed [name] of the availability of "in-person" appointments. Finally, I informed him that there would be a charge for the virtual visit and that he could be  personally, fully or partially, financially responsible for it. [name] expressed understanding and agreed to proceed.   Historic Elements   [name] is a 87 y.o. year old, male patient evaluated today after our last contact on 09/17/2020. [name]  has a past medical history of Arthritis, CAD (coronary artery disease), Cataract, Chronic diastolic CHF (congestive heart failure) ([place]), Chronic kidney disease, stage III (moderate) ([place]), Chronic lower back pain, Colon polyps, COVID-19, Diverticulosis, Dyspnea (2009 since July -Sept), Enlarged prostate, Esophageal stricture, GERD (gastroesophageal reflux disease), Heart murmur, Hiatal hernia, History of carpal tunnel syndrome, History of kidney stones, History of PFTs, Hyperlipidemia, Hypertension, Interstitial lung disease ([place]), Iron deficiency anemia, Nausea & vomiting, On home oxygen therapy, Osteoporosis, Overweight (BMI 25.0-29.9), Peripheral neuropathy, RA (rheumatoid arthritis) ([place]), Seropositive rheumatoid arthritis ([place]), Type II diabetes mellitus ([place]), Wears glasses, and Wears partial dentures. He also  has a past surgical history that includes Total knee arthroplasty (Right, 03/2010); Knee arthroscopy (Right, 2008); Cataract extraction w/ intraocular lens  implant, bilateral (Bilateral); Coronary artery bypass graft (11/1999); Coronary stent placement (02/2012); Shoulder arthroscopy with open rotator cuff repair and distal clavicle acrominectomy (Left, 02/27/2013); Trigger finger release (Left, 02/27/2013); Cholecystectomy open (11/2003); Joint replacement; Esophagogastroduodenoscopy (egd) with esophageal dilation (2010); Cardiac catheterization (08/2004); Cardiac catheterization  (12/31/2011); Cardiac catheterization (2009); left and right heart catheterization with coronary angiogram (12/31/2011); percutaneous coronary stent intervention (pci-s) (12/31/2011); percutaneous coronary stent intervention (pci-s) (N/A, 01/03/2012); Percutaneous coronary intervention-balloon only (01/03/2012); left and right heart catheterization with coronary angiogram (N/A, 01/14/2014); Hand surgery; Cardiac catheterization (N/A, 08/13/2015);  Esophagogastroduodenoscopy (N/A, 03/01/2017); maloney dilation (03/01/2017); Esophagogastroduodenoscopy (egd) with propofol (N/A, 09/12/2018); Botox injection (N/A, 09/12/2018); Balloon dilation (N/A, 09/12/2018); Coronary angioplasty with stent (01/03/2012); Coronary angioplasty with stent (01/14/2014); Esophagogastroduodenoscopy (egd) with propofol (N/A, 11/27/2018); Botox injection (N/A, 11/27/2018); Esophageal dilation (11/27/2018); Esophagogastroduodenoscopy (egd) with propofol (N/A, 02/20/2020); Botox injection (N/A, 02/20/2020); and Balloon dilation (N/A, 02/20/2020). [name] has a current medication list which includes the following prescription(s): amlodipine, aspirin ec, one touch ultra 2, carvedilol, clopidogrel, dha-vitamin c-lutein, furosemide, glucose blood, hydrocodone-acetaminophen, [START ON 10/10/2020] hydrocodone-acetaminophen, [START ON 11/09/2020] hydrocodone-acetaminophen, losartan, nitroglycerin, onetouch delica lancets 69G, oxygen-helium, pantoprazole, polyethylene glycol, pregabalin, rybelsus, tamsulosin, and multiple vitamins-minerals. He  reports that he quit smoking about 57 years ago. His smoking use included cigarettes. He has a 20.00 pack-year smoking history. He has quit using smokeless tobacco. He reports that he does not drink alcohol and does not use drugs. [name] is allergic to doxazosin mesylate and methocarbamol.   HPI  Today, he is being contacted for a post-procedure assessment.  Post-Procedure Evaluation  Procedure (09/17/2020):   Type:  Thermal Lumbar Facet, Medial Branch Radiofrequency Ablation/Neurotomy           Primary Purpose: Therapeutic Region: Posterolateral Lumbosacral Spine Level: L3, L4, L5, Medial Branch Level(s). These levels will denervate the L3-4, L4-5,  lumbar facet joints. Laterality: Right  Sedation: Please see nurses note.  Effectiveness during initial hour after procedure(Ultra-Short Term Relief): 100 %  Local anesthetic used: Long-acting (4-6 hours) Effectiveness: Defined as any analgesic benefit obtained secondary to the administration of local anesthetics. This carries significant diagnostic value as to the etiological location, or anatomical origin, of the pain. Duration of benefit is expected to coincide with the duration of the local anesthetic used.  Effectiveness during initial 4-6 hours after procedure(Short-Term Relief): 100 %   Long-term benefit: Defined as any relief past the pharmacologic duration of the local anesthetics.  Effectiveness past the initial 6 hours after procedure(Long-Term Relief): 75 % (ongoing)   Current benefits: Defined as benefit that persist at this time.   Analgesia:  >75% relief Function: Somewhat improved ROM: Somewhat improved  Laboratory Chemistry Profile   Renal Lab Results  Component Value Date   BUN 38 (H) 04/30/2020   CREATININE 2.11 (H) 04/30/2020   BCR 13 06/24/2017   GFR 30.78 (L) 06/13/2019   GFRAA 30 (L) 06/24/2017   GFRNONAA 30 (L) 04/30/2020     Hepatic Lab Results  Component Value Date   AST 14 06/13/2019   ALT 9 06/13/2019   ALBUMIN 4.1 06/13/2019   ALBUMIN 4.1 06/13/2019   ALKPHOS 58 06/13/2019     Electrolytes Lab Results  Component Value Date   NA 139 04/30/2020   K 4.0 04/30/2020   CL 99 04/30/2020   CALCIUM 9.9 04/30/2020   MG 2.1 04/12/2020   PHOS 2.9 06/13/2019     Bone No results found for: VD25OH, VD125OH2TOT, EX5284XL2, GM0102VO5, 25OHVITD1, 25OHVITD2, 25OHVITD3, TESTOFREE, TESTOSTERONE   Inflammation (CRP: Acute  Phase) (ESR: Chronic Phase) Lab Results  Component Value Date   ESRSEDRATE 38 (H) 11/22/2011       Note: Above Lab results reviewed.  Assessment  The primary encounter diagnosis was Lumbar facet arthropathy. Diagnoses of Lumbar spondylosis and Chronic pain syndrome were also pertinent to this visit.  Plan of Care  Problem-specific:  Lumbar facet arthropathy Status post right L3, L4, L5 lumbar radiofrequency ablation on 09/17/2020.  75% pain relief in regards to his low back and right hip and right buttock pain.  He states that he is able to ambulate with less pain and that he is doing much better from a overall chronic pain standpoint since his RFA.  We will continue to to monitor his symptoms.  Can consider repeating RFA and as early as 6 months if he does have increased low back or right buttock pain.  [name] has a current medication list which includes the following long-term medication(s): furosemide, nitroglycerin, pantoprazole, and pregabalin.    Follow-up plan:   Return for Keep sch. appt.    Recent Visits Date Type Provider Dept  09/17/20 Procedure visit Gillis Santa, MD [facility]  09/09/20 Office Visit Gillis Santa, MD [facility]  Showing recent visits within past 90 days and meeting all other requirements Today's Visits Date Type Provider Dept  09/29/20 Telemedicine Gillis Santa, MD [facility]  Showing today's visits and meeting all other requirements Future Appointments Date Type Provider Dept  12/04/20 Appointment Gillis Santa, MD [facility]  Showing future appointments within next 90 days and meeting all other requirements I discussed the assessment and treatment plan with the patient. The patient was provided an opportunity to ask questions and all were answered. The patient agreed with the plan and demonstrated an understanding of the instructions.  Patient advised to call back or seek an in-person  evaluation if the symptoms or condition worsens.  Duration of encounter: 50minutes.  Note by: Gillis Santa, MD Date: 09/29/2020; Time: 1:27 PM

## 2020-09-30 NOTE — Progress Notes (Signed)
Anesthesia Chart Review   Case: [id number] Date/Time: 10/08/20 1215   Procedure: TRANSURETHRAL RESECTION OF BLADDER TUMOR WITH GEMCITABINE - 80 MINS   Anesthesia type: General   Pre-op diagnosis: BLADDER TUMOR   Location: [facility]OM 07 / [facility]   Surgeons: [name] Fries, MD       DISCUSSION:87 y.o. former smoker with h/o HTN, GERD, CKD Stage III, CHF, CAD (CABG 2001, DES 2013, DES 2015, DES 2017), DM II, RA, 2L O2 at bedtime, bladder tumor scheduled for above procedure 10/08/2020 with Dr. Jacalyn Lefevre.   Per cardiology preoperative evaluation 09/25/2020, "Chart reviewed as part of pre-operative protocol coverage. Patient was contacted 09/25/2020 in reference to pre-operative risk assessment for pending surgery as outlined below.  [name] was last seen in Jan 2022 by [name].  Since that day, [name] has done well. He has a remote history of CABG with subsequent PCI. Per [name], [name] to hold antiplatelets as requested and to proceed with bladder surgery.    Therefore, based on ACC/AHA guidelines, the patient would be at acceptable risk for the planned procedure without further cardiovascular testing."  Pt reports advised to hold Plavix 7 days prior to procedure.  VS: BP (!) 154/74   Pulse 69   Temp 36.6 C (Oral)   Resp 18   Ht 6' (1.829 m)   Wt 88.2 kg   SpO2 96%   BMI 26.36 kg/m   PROVIDERS: [name] Carbon, MD is PCP   [name] Rouge, MD is Cardiologist  LABS: Labs reviewed: Acceptable for surgery. (all labs ordered are listed, but only abnormal results are displayed)  Labs Reviewed  BASIC METABOLIC PANEL - Abnormal; Notable for the following components:      Result Value   Glucose, Bld 185 (*)    BUN 35 (*)    Creatinine, Ser 1.91 (*)    GFR, Estimated 34 (*)    All other components within normal limits  GLUCOSE, CAPILLARY - Abnormal; Notable for the following components:   Glucose-Capillary 186 (*)    All other components within normal limits  CBC      IMAGES:   EKG: 05/01/2020 Rate 74 bpm  NSR  CV: Echo 04/11/2020  1. Left ventricular ejection fraction, by estimation, is 55 to 60%. The  left ventricle has normal function. The left ventricle has no regional  wall motion abnormalities. There is mild left ventricular hypertrophy.  Left ventricular diastolic parameters  are consistent with Grade I diastolic dysfunction (impaired relaxation).   2. Right ventricular systolic function is mildly reduced. The right  ventricular size is normal. There is normal pulmonary artery systolic  pressure. The estimated right ventricular systolic pressure is 76.1 mmHg.   3. Left atrial size was mildly dilated.   4. The mitral valve is normal in structure. Trivial mitral valve  regurgitation. No evidence of mitral stenosis.   5. The aortic valve is tricuspid. Aortic valve regurgitation is trivial.  Mild aortic valve stenosis. Aortic valve mean gradient measures 14.0 mmHg.   6. The inferior vena cava is normal in size with greater than 50%  respiratory variability, suggesting right atrial pressure of 3 mmHg. Past Medical History:  Diagnosis Date   Arthritis    osteoarthritis, s/p R TKR, and digits   CAD (coronary artery disease)    a. s/p CABG (2001)  b. s/p DES to RCA and cutting POBA to ostial PDA (2013)   c. s/p DES to SVG to OM2 (01/14/14)  d. cath: 08/2015 NSTEMI w/ patent LIMA-LAD and 99% stenosis of SVG-OM w/ DES placed. CTO of SVG-RCA and SVG-D1.    Cataract    Chronic diastolic CHF (congestive heart failure) ([place])    a) 09/13 ECHO- LVEF 19-62%, grade 1 diastolic dysfunction, mild LA dilatation, atrial septal aneurysm, AV mobility restricted, but no sig AS by doppler; b) 09/04/08 ECHO- LVH, ef 60%, mild AS, c. echo 08/2015: EF perserved of 55-60% with inferolateral HK. Mild AS noted.   Chronic kidney disease, stage III (moderate) ([name])    Chronic lower back pain    Colon polyps    COVID-19    Diverticulosis    Dyspnea 2009 since July  -Sept   05/06/08-CPST-  normal effort, reduced VO2 max 20.5 /65%, reduced at 8.2/ 40%, normal breathing resetvca of 55%, submaximal heart rate response 112/77%, flattened o2 pluse response at peak exercise-12 ml/beat @ 85%, No VQ mismatch abnormalities, All c/w CIRC Limitation   Enlarged prostate    Esophageal stricture    a. s/p dilation spring 2010   GERD (gastroesophageal reflux disease)    Heart murmur    Hiatal hernia    History of carpal tunnel syndrome    Bilateral   History of kidney stones    History of PFTs    mixed pattern on spiro. mild restn on lung volumes with near normal DLCO. Pattern can be explained by CABG scar. Fev1 2.2L/73%, ratio 68 (67), TLC 4.7/68%,RV 1.5L/55%,DLCO 79%   Hyperlipidemia    Hypertension    Interstitial lung disease ([name])    NOS   Iron deficiency anemia    Nausea & vomiting    2018/2019   On home oxygen therapy    2 L [place] at bedtime   Osteoporosis    Overweight (BMI 25.0-29.9)    BMI 29   Peripheral neuropathy    RA (rheumatoid arthritis) ([place])    [name]   Seropositive rheumatoid arthritis ([place])    Type II diabetes mellitus ([place])    diet controlled   Wears glasses    Wears partial dentures    upper    Past Surgical History:  Procedure Laterality Date   BALLOON DILATION N/A 09/12/2018   Procedure: BALLOON DILATION;  Surgeon: Irene Shipper, MD;  [facility];  Service: Endoscopy;  Laterality: N/A;   BALLOON DILATION N/A 02/20/2020   Procedure: BALLOON DILATION;  Surgeon: Irene Shipper, MD;  [facility];  Service: Endoscopy;  Laterality: N/A;   BOTOX INJECTION N/A 09/12/2018   Procedure: BOTOX INJECTION;  Surgeon: Irene Shipper, MD;  [facility];  Service: Endoscopy;  Laterality: N/A;   BOTOX INJECTION N/A 11/27/2018   Procedure: BOTOX INJECTION;  Surgeon: Irene Shipper, MD;  [facility];  Service: Endoscopy;  Laterality: N/A;   BOTOX INJECTION N/A 02/20/2020   Procedure: BOTOX INJECTION;   Surgeon: Irene Shipper, MD;  [facility];  Service: Endoscopy;  Laterality: N/A;   CARDIAC CATHETERIZATION  08/2004   CP- no MI, Cath- small vessell disease    CARDIAC CATHETERIZATION  12/31/2011   80% distal LM, 100% native LAD, LCx and RCA, 30% prox SVG-OM, SVG-D1 normal, 99% distal, 80% ostial SVG-RCA distal to graft, LIMA-LAD normal; LVEF mildly decreased with posterior basal AK    CARDIAC CATHETERIZATION  2009   with patent grafts/notes 12/31/2011   CARDIAC CATHETERIZATION N/A 08/13/2015   Procedure: Left Heart Cath and Cors/Grafts Angiography;  Surgeon: Sherren Mocha, MD;  Location: [place]  CV LAB;  Service: Cardiovascular;  Laterality: N/A;   CATARACT EXTRACTION W/ INTRAOCULAR LENS  IMPLANT, BILATERAL Bilateral    CHOLECYSTECTOMY OPEN  11/2003   [name]   CORONARY ANGIOPLASTY WITH STENT PLACEMENT  01/03/2012   Successful DES to SVG-RCA and cutting balloon angioplasty ostial  PDA    CORONARY ANGIOPLASTY WITH STENT PLACEMENT  01/14/2014   "1"   CORONARY ARTERY BYPASS GRAFT  11/1999   CABG X5   CORONARY STENT PLACEMENT  02/2012   1 stent and balloon   ESOPHAGEAL DILATION  11/27/2018   Procedure: ESOPHAGEAL DILATION;  Surgeon: Irene Shipper, MD;  [facility];  Service: Endoscopy;;   ESOPHAGOGASTRODUODENOSCOPY N/A 03/01/2017   Procedure: ESOPHAGOGASTRODUODENOSCOPY (EGD);  Surgeon: Irene Shipper, MD;  Location: [facility];  Service: Endoscopy;  Laterality: N/A;   ESOPHAGOGASTRODUODENOSCOPY (EGD) WITH ESOPHAGEAL DILATION  2010   ESOPHAGOGASTRODUODENOSCOPY (EGD) WITH PROPOFOL N/A 09/12/2018   Procedure: ESOPHAGOGASTRODUODENOSCOPY (EGD) WITH PROPOFOL;  Surgeon: Irene Shipper, MD;  [facility];  Service: Endoscopy;  Laterality: N/A;   ESOPHAGOGASTRODUODENOSCOPY (EGD) WITH PROPOFOL N/A 11/27/2018   Procedure: ESOPHAGOGASTRODUODENOSCOPY (EGD) WITH PROPOFOL, WITH BALLOON DILATION;  Surgeon: Irene Shipper, MD;  [facility];  Service: Endoscopy;   Laterality: N/A;   ESOPHAGOGASTRODUODENOSCOPY (EGD) WITH PROPOFOL N/A 02/20/2020   Procedure: ESOPHAGOGASTRODUODENOSCOPY (EGD) WITH PROPOFOL;  Surgeon: Irene Shipper, MD;  [facility];  Service: Endoscopy;  Laterality: N/A;   HAND SURGERY     bilateral carpal tunnel releases   JOINT REPLACEMENT     KNEE ARTHROSCOPY Right 2008   LEFT AND RIGHT HEART CATHETERIZATION WITH CORONARY ANGIOGRAM  12/31/2011   Procedure: LEFT AND RIGHT HEART CATHETERIZATION WITH CORONARY ANGIOGRAM;  Surgeon: Burnell Blanks, MD;  Location: [facility] CATH LAB;  Service: Cardiovascular;;   LEFT AND RIGHT HEART CATHETERIZATION WITH CORONARY ANGIOGRAM N/A 01/14/2014   Procedure: LEFT AND RIGHT HEART CATHETERIZATION WITH CORONARY ANGIOGRAM;  Surgeon: Peter M Martinique, MD;  Location: [facility] C[facility] LAB;  Service: Cardiovascular;  Laterality: N/A;   MALONEY DILATION  03/01/2017   Procedure: [name] Minks DILATION;  Surgeon: Irene Shipper, MD;  [facility];  Service: Endoscopy;;   PERCUTANEOUS CORONARY INTERVENTION-BALLOON ONLY  01/03/2012   Procedure: PERCUTANEOUS CORONARY INTERVENTION-BALLOON ONLY;  Surgeon: Peter M Martinique, MD;  Location: [facility] CATH LAB;  Service: Cardiovascular;;   PERCUTANEOUS CORONARY STENT INTERVENTION (PCI-S)  12/31/2011   Procedure: PERCUTANEOUS CORONARY STENT INTERVENTION (PCI-S);  Surgeon: Burnell Blanks, MD;  Location: [facility] C[facility] LAB;  Service: Cardiovascular;;   PERCUTANEOUS CORONARY STENT INTERVENTION (PCI-S) N/A 01/03/2012   Procedure: PERCUTANEOUS CORONARY STENT INTERVENTION (PCI-S);  Surgeon: Peter M Martinique, MD;  Location: [facility] CATH LAB;  Service: Cardiovascular;  Laterality: N/A;   SHOULDER ARTHROSCOPY WITH OPEN ROTATOR CUFF REPAIR AND DISTAL CLAVICLE ACROMINECTOMY Left 02/27/2013   Procedure: LEFT SHOULDER ARTHROSCOPY WITH MINI OPEN ROTATOR CUFF REPAIR AND SUBACROMIAL DECOMPRESSION AND DISTAL CLAVICLE RESECTION;  Surgeon: [name], MD;  Location: [facility];  Service: Orthopedics;   Laterality: Left;   TOTAL KNEE ARTHROPLASTY Right 03/2010   [name]   TRIGGER FINGER RELEASE Left 02/27/2013   Procedure: RELEASE TRIGGER FINGER/A-1 PULLEY;  Surgeon: [name], MD;  Location: [place];  Service: Orthopedics;  Laterality: Left;    MEDICATIONS:  amLODipine (NORVASC) 5 MG tablet   aspirin EC 81 MG tablet   Blood Glucose Monitoring Suppl (ONE TOUCH ULTRA 2) w/Device KIT   carvedilol (COREG) 6.25 MG tablet   clopidogrel (PLAVIX) 75 MG tablet  DHA-Vitamin C-Lutein (EYE HEALTH FORMULA PO)   furosemide (LASIX) 40 MG tablet   glucose blood (ONE TOUCH ULTRA TEST) test strip   HYDROcodone-acetaminophen (NORCO) 10-325 MG tablet   [START ON 10/10/2020] HYDROcodone-acetaminophen (NORCO) 10-325 MG tablet   [START ON 11/09/2020] HYDROcodone-acetaminophen (NORCO) 10-325 MG tablet   losartan (COZAAR) 25 MG tablet   Multiple Vitamins-Minerals (EYE MULTIVITAMIN PO)   nitroGLYCERIN (NITROSTAT) 0.4 MG SL tablet   OneTouch Delica Lancets 74M MISC   OXYGEN   pantoprazole (PROTONIX) 40 MG tablet   polyethylene glycol (MIRALAX / GLYCOLAX) 17 g packet   pregabalin (LYRICA) 50 MG capsule   Semaglutide (RYBELSUS) 3 MG TABS   tamsulosin (FLOMAX) 0.4 MG CAPS capsule   No current facility-administered medications for this encounter.    Konrad Felix, PA-C [facility] [phone]

## 2020-10-01 DIAGNOSIS — R310 Gross hematuria: Secondary | ICD-10-CM | POA: Diagnosis not present

## 2020-10-07 DIAGNOSIS — N184 Chronic kidney disease, stage 4 (severe): Secondary | ICD-10-CM | POA: Diagnosis not present

## 2020-10-08 ENCOUNTER — Other Ambulatory Visit: Payer: Self-pay

## 2020-10-08 ENCOUNTER — Ambulatory Visit (HOSPITAL_COMMUNITY): Payer: Medicare Other | Admitting: Anesthesiology

## 2020-10-08 ENCOUNTER — Encounter (HOSPITAL_COMMUNITY)
Admission: RE | Disposition: A | Discharge status: Home / Self Care | Payer: Self-pay | Source: Home / Self Care | Attending: Urology

## 2020-10-08 ENCOUNTER — Encounter (HOSPITAL_COMMUNITY): Payer: Self-pay | Admitting: Urology

## 2020-10-08 ENCOUNTER — Ambulatory Visit (HOSPITAL_COMMUNITY)
Admission: RE | Admit: 2020-10-08 | Discharge: 2020-10-08 | Disposition: A | Discharge status: Home / Self Care | Payer: Medicare Other | Attending: Urology | Admitting: Urology

## 2020-10-08 ENCOUNTER — Ambulatory Visit (HOSPITAL_COMMUNITY): Payer: Medicare Other | Admitting: Physician Assistant

## 2020-10-08 DIAGNOSIS — I2510 Atherosclerotic heart disease of native coronary artery without angina pectoris: Secondary | ICD-10-CM | POA: Insufficient documentation

## 2020-10-08 DIAGNOSIS — D494 Neoplasm of unspecified behavior of bladder: Secondary | ICD-10-CM | POA: Diagnosis not present

## 2020-10-08 DIAGNOSIS — Z79899 Other long term (current) drug therapy: Secondary | ICD-10-CM | POA: Diagnosis not present

## 2020-10-08 DIAGNOSIS — Z7902 Long term (current) use of antithrombotics/antiplatelets: Secondary | ICD-10-CM | POA: Diagnosis not present

## 2020-10-08 DIAGNOSIS — F17200 Nicotine dependence, unspecified, uncomplicated: Secondary | ICD-10-CM | POA: Diagnosis not present

## 2020-10-08 DIAGNOSIS — G4733 Obstructive sleep apnea (adult) (pediatric): Secondary | ICD-10-CM | POA: Diagnosis not present

## 2020-10-08 DIAGNOSIS — C679 Malignant neoplasm of bladder, unspecified: Secondary | ICD-10-CM | POA: Diagnosis not present

## 2020-10-08 DIAGNOSIS — Z87442 Personal history of urinary calculi: Secondary | ICD-10-CM | POA: Insufficient documentation

## 2020-10-08 DIAGNOSIS — D509 Iron deficiency anemia, unspecified: Secondary | ICD-10-CM | POA: Diagnosis not present

## 2020-10-08 DIAGNOSIS — Z951 Presence of aortocoronary bypass graft: Secondary | ICD-10-CM | POA: Diagnosis not present

## 2020-10-08 DIAGNOSIS — N35911 Unspecified urethral stricture, male, meatal: Secondary | ICD-10-CM | POA: Diagnosis not present

## 2020-10-08 DIAGNOSIS — C674 Malignant neoplasm of posterior wall of bladder: Secondary | ICD-10-CM | POA: Diagnosis not present

## 2020-10-08 DIAGNOSIS — Z7982 Long term (current) use of aspirin: Secondary | ICD-10-CM | POA: Diagnosis not present

## 2020-10-08 DIAGNOSIS — E785 Hyperlipidemia, unspecified: Secondary | ICD-10-CM | POA: Diagnosis not present

## 2020-10-08 HISTORY — PX: TRANSURETHRAL RESECTION OF BLADDER TUMOR WITH MITOMYCIN-C: SHX6459

## 2020-10-08 LAB — GLUCOSE, CAPILLARY
Glucose-Capillary: 176 mg/dL — ABNORMAL HIGH (ref 70–99)
Glucose-Capillary: 167 mg/dL — ABNORMAL HIGH (ref 70–99)

## 2020-10-08 SURGERY — TRANSURETHRAL RESECTION OF BLADDER TUMOR WITH MITOMYCIN-C
Anesthesia: General

## 2020-10-08 MED ORDER — PROPOFOL 10 MG/ML IV BOLUS
INTRAVENOUS | Status: DC | PRN
  Administered 2020-10-08: 120 mg via INTRAVENOUS

## 2020-10-08 MED ORDER — FENTANYL CITRATE (PF) 100 MCG/2ML IJ SOLN
INTRAMUSCULAR | Status: AC
  Filled 2020-10-08: qty 2

## 2020-10-08 MED ORDER — FENTANYL CITRATE (PF) 100 MCG/2ML IJ SOLN
INTRAMUSCULAR | Status: AC
  Administered 2020-10-08: 50 ug via INTRAVENOUS
  Filled 2020-10-08: qty 2

## 2020-10-08 MED ORDER — ONDANSETRON HCL 4 MG/2ML IJ SOLN: 4.0000 mg | Freq: Once | INTRAMUSCULAR | Status: DC | PRN

## 2020-10-08 MED ORDER — PHENYLEPHRINE 40 MCG/ML (10ML) SYRINGE FOR IV PUSH (FOR BLOOD PRESSURE SUPPORT)
PREFILLED_SYRINGE | INTRAVENOUS | Status: DC | PRN
  Administered 2020-10-08 (×2): 120 ug via INTRAVENOUS

## 2020-10-08 MED ORDER — FENTANYL CITRATE (PF) 100 MCG/2ML IJ SOLN: 25.0000 ug | INTRAMUSCULAR | Status: DC | PRN

## 2020-10-08 MED ORDER — BELLADONNA ALKALOIDS-OPIUM 16.2-60 MG RE SUPP
RECTAL | Status: AC
  Filled 2020-10-08: qty 1

## 2020-10-08 MED ORDER — BELLADONNA ALKALOIDS-OPIUM 16.2-60 MG RE SUPP
1.0000 | Freq: Once | RECTAL | Status: AC
  Administered 2020-10-08: 1 via RECTAL

## 2020-10-08 MED ORDER — LIDOCAINE 2% (20 MG/ML) 5 ML SYRINGE
INTRAMUSCULAR | Status: DC | PRN
  Administered 2020-10-08: 80 mg via INTRAVENOUS

## 2020-10-08 MED ORDER — 0.9 % SODIUM CHLORIDE (POUR BTL) OPTIME
TOPICAL | Status: DC | PRN
  Administered 2020-10-08: 1000 mL

## 2020-10-08 MED ORDER — CEFAZOLIN SODIUM-DEXTROSE 2-4 GM/100ML-% IV SOLN
2.0000 g | INTRAVENOUS | Status: AC
  Administered 2020-10-08: 2 g via INTRAVENOUS
  Filled 2020-10-08: qty 100

## 2020-10-08 MED ORDER — CHLORHEXIDINE GLUCONATE 0.12 % MT SOLN
15.0000 mL | Freq: Once | OROMUCOSAL | Status: AC
  Administered 2020-10-08: 15 mL via OROMUCOSAL

## 2020-10-08 MED ORDER — GEMCITABINE CHEMO FOR BLADDER INSTILLATION 2000 MG
2000.0000 mg | Freq: Once | INTRAVENOUS | Status: AC
  Administered 2020-10-08: 2000 mg via INTRAVESICAL
  Filled 2020-10-08: qty 2000

## 2020-10-08 MED ORDER — FENTANYL CITRATE (PF) 100 MCG/2ML IJ SOLN
INTRAMUSCULAR | Status: DC | PRN
  Administered 2020-10-08: 25 ug via INTRAVENOUS
  Administered 2020-10-08: 50 ug via INTRAVENOUS

## 2020-10-08 MED ORDER — SODIUM CHLORIDE 0.9 % IR SOLN
Status: DC | PRN
  Administered 2020-10-08: 12000 mL via INTRAVESICAL

## 2020-10-08 MED ORDER — LACTATED RINGERS IV SOLN: INTRAVENOUS | Status: DC

## 2020-10-08 MED ORDER — ORAL CARE MOUTH RINSE: 15.0000 mL | Freq: Once | OROMUCOSAL | Status: AC

## 2020-10-08 SURGICAL SUPPLY — 17 items
BAG URINE DRAIN 2000ML AR STRL (UROLOGICAL SUPPLIES) IMPLANT
BAG URO CATCHER STRL LF (MISCELLANEOUS) ×2 IMPLANT
CATH FOLEY 2WAY 5CC 20FR (CATHETERS) ×2 IMPLANT
CLOTH BEACON ORANGE TIMEOUT ST (SAFETY) ×2 IMPLANT
DRAPE FOOT SWITCH (DRAPES) ×2 IMPLANT
ELECT REM PT RETURN 15FT ADLT (MISCELLANEOUS) ×2 IMPLANT
GLOVE SURG ENC MOIS LTX SZ6.5 (GLOVE) ×2 IMPLANT
GOWN STRL REUS W/TWL LRG LVL3 (GOWN DISPOSABLE) ×2 IMPLANT
KIT TURNOVER KIT A (KITS) ×2 IMPLANT
LOOP CUT BIPOLAR 24F LRG (ELECTROSURGICAL) ×2 IMPLANT
MANIFOLD NEPTUNE II (INSTRUMENTS) ×2 IMPLANT
PACK CYSTO (CUSTOM PROCEDURE TRAY) ×2 IMPLANT
PLUG CATH AND CAP STER (CATHETERS) ×2 IMPLANT
SYR TOOMEY IRRIG 70ML (MISCELLANEOUS)
SYRINGE TOOMEY IRRIG 70ML (MISCELLANEOUS) IMPLANT
TUBING CONNECTING 10 (TUBING) ×2 IMPLANT
TUBING UROLOGY SET (TUBING) ×2 IMPLANT

## 2020-10-08 NOTE — Transfer of Care (Signed)
Immediate Anesthesia Transfer of Care Note  Patient: [name]  Procedure(s) Performed: TRANSURETHRAL RESECTION OF BLADDER TUMOR WITH GEMCITABINE  Patient Location: PACU  Anesthesia Type:General  Level of Consciousness: awake, alert  and oriented  Airway & Oxygen Therapy: Patient Spontanous Breathing and Patient connected to face mask oxygen  Post-op Assessment: Report given to RN and Post -op Vital signs reviewed and stable  Post vital signs: Reviewed and stable  Last Vitals:  Vitals Value Taken Time  BP 148/81 10/08/20 1330  Temp    Pulse 66 10/08/20 1332  Resp 17 10/08/20 1332  SpO2 100 % 10/08/20 1332  Vitals shown include unvalidated device data.  Last Pain:  Vitals:   10/08/20 1036  TempSrc: Oral         Complications: No notable events documented.

## 2020-10-08 NOTE — Op Note (Signed)
PATIENT:  CARR SHARTZER  PRE-OPERATIVE DIAGNOSIS: Bladder tumor  POST-OPERATIVE DIAGNOSIS: Same  PROCEDURE:  Procedure(s): 1. TRANSURETHRAL RESECTION OF BLADDER TUMOR (TURBT) (>2cm.) 2. Instillation of intravesical chemotherapy (Gemcitabine) 3. Urethral dilation  SURGEON:  Jacalyn Lefevre, MD  ANESTHESIA:   General  EBL:  Minimal  DRAINS: Urethral catheter (20 Fr coude Foley)   SPECIMEN:  Bladder tumor  DISPOSITION OF SPECIMEN:  PATHOLOGY  FINDINGS: 1.  Normal anterior urethra 2.  Trilobar prostatic urethra with large intravesical median lobe 3.  Bilateral orthotopic ureteral orifices 4.  Severe trabeculation 5.  Papillary bladder mass greater than 2 cm in size seen from posterior superior bladder wall  Indication: 87 year old man who presented with gross hematuria found to have partially calcified papillary bladder mass posterior bladder wall on imaging confirmed on cystoscopy.  Description of operation: The patient was taken to the operating room and administered general anesthesia. They were then placed on the table and moved to the dorsal lithotomy position after which the genitalia was sterilely prepped and draped. An official timeout was then performed.  Attempts at placing 26 French resectoscope in the urethral meatus were unsuccessful.  Urethral dilation then took place from 59 [place] to [place].  Next,the 26 French resectoscope with the 30 lens and visual obturator were then passed into the bladder under direct visualization. Urethra appeared normal. The visual obturator was then removed and the Gyrus resectoscope element with 30  lens was then inserted and the bladder was fully and systematically inspected. Ureteral orifices were noted to be in the normal anatomic positions.   The bladder mass was seen at the posterior superior wall.  It was then resected with bipolar electrocautery.  Resection continued till there is no further evidence of bladder tumor visible.   Reinspection of the bladder revealed all obvious tumor had been fully resected and there was no evidence of perforation.  Hemostasis was adequate with irrigant turned. The [place] syringe was then used to irrigate the bladder and remove all of the portions of bladder tumor which were sent to pathology. I then removed the resectoscope.  A 20 French Foley catheter was then inserted in the bladder and irrigated. The irrigant returned slightly pink with no clots. The patient was awakened and taken to the recovery room.  While in the recovery room 2 g of gemcitabine in 52.6 cc of sterile water was instilled in the bladder through the catheter and the catheter was plugged. This will remain indwelling for approximately one hour. It will then be drained from the bladder and the catheter will be removed and the patient discharged home.  PLAN OF CARE: Discharge to home after PACU  PATIENT DISPOSITION:  PACU - hemodynamically stable.

## 2020-10-08 NOTE — H&P (Signed)
CC/HPI: cc: bladder mass   09/23/20: 87 year old man with a history of nephrolithiasis present to the ED with 3 weeks of gross hematuria. CT scan 09/02/2020 showed calcified bladder mass the stone. Patient's gross hematuria has resolved however he still has evidence of microscopic hematuria on urinalysis today. Patient is on Plavix for CAD. He has had a cardiac bypass in 2001. Patient has stop Plavix in the past. He also has a history of kidney stones and has undergone shockwave lithotripsy x2.   10/01/20: Here for diagnostic cystoscopy     ALLERGIES: No Known Drug Allergies    MEDICATIONS: Aspirin  Plavix 75 mg tablet  Amlodipine Besylate 5 mg tablet  Coreg 6.25 mg tablet  Flomax 0.4 mg capsule  Lasix 40 mg tablet  Lyrica 50 mg capsule  Nitrostat  Protonix 40 mg tablet, delayed release  Rybelsus 3 mg tablet  Vitamin C     GU PSH: None   NON-GU PSH: Coronary Artery Bypass Grafting Hand/finger Surgery, Bilateral Knee Arthroscopy, Right Shoulder Arthroscopy/surgery, Left     GU PMH: Bladder tumor/neoplasm - 09/23/2020 Gross hematuria - 09/23/2020 Chronic kidney disease stage 4 (GFR 15-30) Unil Inguinal Hernia W/O obst or gang,non-recurrent    NON-GU PMH: Achalasia of cardia Arthritis Atherosclerotic heart disease of native coronary artery with unspecified angina pectoris Bilateral primary osteoarthritis of knee Cardiac murmur, unspecified Chronic diastolic (congestive) heart failure Constipation, unspecified Diabetes Type 2 Diverticulosis of large intestine without perforation or abscess without bleeding Dysphagia, unspecified GERD Glaucoma Interstitial pulmonary disease, unspecified Iron deficiency anemia, unspecified Neuralgia and neuritis, unspecified Obstructive sleep apnea (adult) (pediatric) Opioid dependence, uncomplicated Orthostatic hypotension Polyneuropathy, unspecified Presence of right artificial knee joint Rheumatoid arthritis with rheumatoid factor,  unspecified Ventricular tachycardia    FAMILY HISTORY: 3 daughters - Runs in Family Alcoholism - Sister Colon Cancer - Brother copd - Mother Diabetes - Brother Heart Disease - Father Prostate Cancer - Brother   SOCIAL HISTORY: Marital Status: Married Preferred Language: English; Ethnicity: Not Hispanic Or Latino; Race: White Current Smoking Status: Patient smokes occasionally.   Tobacco Use Assessment Completed: Used Tobacco in last 30 days? Has never drank.  Drinks 2 caffeinated drinks per day.    REVIEW OF SYSTEMS:    GU Review Male:   Patient denies frequent urination, hard to postpone urination, burning/ pain with urination, get up at night to urinate, leakage of urine, stream starts and stops, trouble starting your stream, have to strain to urinate , erection problems, and penile pain.  Gastrointestinal (Upper):   Patient denies nausea, vomiting, and indigestion/ heartburn.  Gastrointestinal (Lower):   Patient denies diarrhea and constipation.  Constitutional:   Patient denies fever, night sweats, weight loss, and fatigue.  Skin:   Patient denies skin rash/ lesion and itching.  Eyes:   Patient denies blurred vision and double vision.  Ears/ Nose/ Throat:   Patient denies sore throat and sinus problems.  Hematologic/Lymphatic:   Patient denies swollen glands and easy bruising.  Cardiovascular:   Patient denies leg swelling and chest pains.  Respiratory:   Patient denies cough and shortness of breath.  Endocrine:   Patient denies excessive thirst.  Musculoskeletal:   Patient denies back pain and joint pain.  Neurological:   Patient denies dizziness and headaches.  Psychologic:   Patient denies depression and anxiety.   VITAL SIGNS: None   MULTI-SYSTEM PHYSICAL EXAMINATION:    Constitutional: Well-nourished. No physical deformities. Normally developed. Good grooming.  Neck: Neck symmetrical, not swollen. Normal tracheal position.  Respiratory:  No labored breathing, no use  of accessory muscles.   Skin: No paleness, no jaundice, no cyanosis. No lesion, no ulcer, no rash.  Neurologic / Psychiatric: Oriented to time, oriented to place, oriented to person. No depression, no anxiety, no agitation.  Gastrointestinal: No rigidity, non obese abdomen.   Eyes: Normal conjunctivae. Normal eyelids.  Ears, Nose, Mouth, and Throat: Left ear no scars, no lesions, no masses. Right ear no scars, no lesions, no masses. Nose no scars, no lesions, no masses. Normal hearing. Normal lips.  Musculoskeletal: Normal gait and station of head and neck.     Complexity of Data:  Records Review:   Previous Patient Records  Urine Test Review:   Urinalysis   PROCEDURES:         Flexible Cystoscopy - 52000  Risks, benefits, and some of the potential complications of the procedure were discussed at length with the patient including infection, bleeding, voiding discomfort, urinary retention, fever, chills, sepsis, and others. All questions were answered. Informed consent was obtained. Sterile technique and intraurethral analgesia were used.  Meatus:  Normal size. Normal location. Normal condition.  Urethra:  No strictures.  External Sphincter:  Normal.  Verumontanum:  Normal.  Prostate:  Borderline obstructing lateral lobes.  Bladder Neck:  Non-obstructing.  Ureteral Orifices:  Normal location. Normal size. Normal shape. Effluxed clear urine.  Bladder:  Mild trabeculation. Nodular bladder mass from right posterior wall. Difficult to visualize due to bleeding.      The lower urinary tract was carefully examined. The procedure was well-tolerated and without complications. Antibiotic instructions were given. Instructions were given to call the office immediately for bloody urine, difficulty urinating, urinary retention, painful or frequent urination, fever, chills, nausea, vomiting or other illness. The patient stated that he understood these instructions and would comply with them.          Urinalysis w/Scope Dipstick Dipstick Cont'd Micro  Color: Red Bilirubin: Neg mg/dL WBC/hpf: 6 - 10/hpf  Appearance: Cloudy Ketones: Trace mg/dL RBC/hpf: >60/hpf  Specific Gravity: 1.025 Blood: 3+ ery/uL Bacteria: NS (Not Seen)  pH: 6.5 Protein: 2+ mg/dL Cystals: NS (Not Seen)  Glucose: Trace mg/dL Urobilinogen: 0.2 mg/dL Casts: NS (Not Seen)    Nitrites: Neg Trichomonas: Not Present    Leukocyte Esterase: 2+ leu/uL Mucous: Not Present      Epithelial Cells: NS (Not Seen)      Yeast: NS (Not Seen)      Sperm: Not Present    ASSESSMENT:      ICD-10 Details  1 GU:   Bladder tumor/neoplasm - D41.4 Undiagnosed New Problem  2   Gross hematuria - R31.0 Undiagnosed New Problem   PLAN:           Document Letter(s):  Created for Patient: Clinical Summary         Notes:   Cystoscopy confirms bladder mass concerning for bladder cancer. Risks and benefits of TURBT and intravesical gemcitabine discussed with patient including but not limited to bleeding, infection, damage to surrounding structures, bladder perforation, pain, need for foley catheter, need for additional treatment. Surgery scheduled for 10/08/20.

## 2020-10-08 NOTE — Anesthesia Postprocedure Evaluation (Signed)
Anesthesia Post Note  Patient: [name]  Procedure(s) Performed: TRANSURETHRAL RESECTION OF BLADDER TUMOR WITH GEMCITABINE     Patient location during evaluation: PACU Anesthesia Type: General Level of consciousness: awake and alert and oriented Pain management: pain level controlled Vital Signs Assessment: post-procedure vital signs reviewed and stable Respiratory status: spontaneous breathing, nonlabored ventilation and respiratory function stable Cardiovascular status: blood pressure returned to baseline and stable Postop Assessment: no apparent nausea or vomiting Anesthetic complications: no   No notable events documented.  Last Vitals:  Vitals:   10/08/20 1400 10/08/20 1415  BP: (!) 165/82 (!) 168/72  Pulse: 66 67  Resp: 15 14  Temp:    SpO2: 100% 97%    Last Pain:  Vitals:   10/08/20 1345  TempSrc:   PainSc: 3                  [name],[name] A.

## 2020-10-08 NOTE — Discharge Instructions (Signed)
Transurethral Resection of Bladder Tumor (TURBT)   Definition:  Transurethral Resection of the Bladder Tumor is a surgical procedure used to diagnose and remove tumors within the bladder. TURBT is the most common treatment for early stage bladder cancer.  General instructions:     Your recent bladder surgery requires very little post hospital care but some definite precautions.  Despite the fact that no skin incisions were used, the area around the bladder incisions are raw and covered with scabs to promote healing and prevent bleeding. Certain precautions are needed to insure that the scabs are not disturbed over the next 2-4 weeks while the healing proceeds.  Because the raw surface inside your bladder and the irritating effects of urine you may expect frequency of urination and/or urgency (a stronger desire to urinate) and perhaps even getting up at night more often. This will usually resolve or improve slowly over the healing period. You may see some blood in your urine over the first 6 weeks. Do not be alarmed, even if the urine was clear for a while. Get off your feet and drink lots of fluids until clearing occurs. If you start to pass clots or don't improve call us.  Catheter: (If you are discharged with a catheter.)  1. Keep your catheter secured to your leg at all times with tape or the supplied strap. 2. You may experience leakage of urine around your catheter- as long as the  catheter continues to drain, this is normal.  If your catheter stops draining  go to the ER. 3. You may also have blood in your urine, even after it has been clear for  several days; you may even pass some small blood clots or other material.  This  is normal as well.  If this happens, sit down and drink plenty of water to help  make urine to flush out your bladder.  If the blood in your urine becomes worse  after doing this, contact our office or return to the ER. 4. You may use the leg bag (small bag)  during the day, but use the large bag at  night.  Diet:  You may return to your normal diet immediately. Because of the raw surface of your bladder, alcohol, spicy foods, foods high in acid and drinks with caffeine may cause irritation or frequency and should be used in moderation. To keep your urine flowing freely and avoid constipation, drink plenty of fluids during the day (8-10 glasses). Tip: Avoid cranberry juice because it is very acidic.  Activity:  Your physical activity doesn't need to be restricted. However, if you are very active, you may see some blood in the urine. We suggest that you reduce your activity under the circumstances until the bleeding has stopped.  Bowels:  It is important to keep your bowels regular during the postoperative period. Straining with bowel movements can cause bleeding. A bowel movement every other day is reasonable. Use a mild laxative if needed, such as milk of magnesia 2-3 tablespoons, or 2 Dulcolax tablets. Call if you continue to have problems. If you had been taking narcotics for pain, before, during or after your surgery, you may be constipated. Take a laxative if necessary.    Medication:  You should resume your pre-surgery medications unless told not to. In addition you may be given an antibiotic to prevent or treat infection. Antibiotics are not always necessary. All medication should be taken as prescribed until the bottles are finished unless you are having   an unusual reaction to one of the drugs.    

## 2020-10-08 NOTE — Interval H&P Note (Signed)
History and Physical Interval Note:  10/08/2020 12:09 PM  [name]  has presented today for surgery, with the diagnosis of BLADDER TUMOR.  The various methods of treatment have been discussed with the patient and family. After consideration of risks, benefits and other options for treatment, the patient has consented to  Procedure(s) with comments: TRANSURETHRAL RESECTION OF BLADDER TUMOR WITH GEMCITABINE (N/A) - 75 MINS as a surgical intervention.  The patient's history has been reviewed, patient examined, no change in status, stable for surgery.  I have reviewed the patient's chart and labs.  Questions were answered to the patient's satisfaction.     Deran Barro D Euva Rundell

## 2020-10-08 NOTE — Anesthesia Procedure Notes (Signed)
Procedure Name: LMA Insertion Date/Time: 10/08/2020 12:33 PM Performed by: [place] Ilda Laskin C, CRNA Pre-anesthesia Checklist: Patient identified, Emergency Drugs available, Suction available and Patient being monitored Patient Re-evaluated:Patient Re-evaluated prior to induction Oxygen Delivery Method: Circle system utilized Preoxygenation: Pre-oxygenation with 100% oxygen Induction Type: IV induction Ventilation: Mask ventilation without difficulty LMA: LMA inserted LMA Size: 4.0 Number of attempts: 1 Airway Equipment and Method: Bite block Placement Confirmation: positive ETCO2 Tube secured with: Tape Dental Injury: Teeth and Oropharynx as per pre-operative assessment

## 2020-10-08 NOTE — Anesthesia Preprocedure Evaluation (Addendum)
Anesthesia Evaluation  Patient identified by MRN, date of birth, ID band Patient awake    Reviewed: Allergy & Precautions, NPO status , Patient's Chart, lab work & pertinent test results, reviewed documented beta blocker date and time   Airway Mallampati: II  TM Distance: >3 FB Neck ROM: Full    Dental  (+) Dental Advisory Given, Partial Upper, Missing, Poor Dentition   Pulmonary shortness of breath and with exertion, sleep apnea and Oxygen sleep apnea , former smoker,  Interstitial lung disease on home O2 at night   Pulmonary exam normal breath sounds clear to auscultation       Cardiovascular hypertension, Pt. on medications and Pt. on home beta blockers + angina with exertion + CAD, + Cardiac Stents, + CABG and +CHF  Normal cardiovascular exam+ Valvular Problems/Murmurs  Rhythm:Regular Rate:Normal  CABG  2001 PTCA with Stents 2013, 2015   Neuro/Psych Diabetic peripheral neuropathy  Neuromuscular disease negative psych ROS   GI/Hepatic Neg liver ROS, hiatal hernia, GERD  Medicated and Controlled,Hx/o esophageal stricture   Endo/Other  diabetes, Well Controlled, Type 2Hyperlipidemia  Renal/GU Renal InsufficiencyRenal diseaseHx/o renal calculi   Bladder mass    Musculoskeletal  (+) Arthritis , Osteoarthritis and Rheumatoid disorders,    Abdominal   Peds  Hematology  (+) Blood dyscrasia, anemia , Plavix therapy- last dose 10/01/20   Anesthesia Other Findings   Reproductive/Obstetrics                           Anesthesia Physical Anesthesia Plan  ASA: 3  Anesthesia Plan: General   Post-op Pain Management:    Induction: Intravenous  PONV Risk Score and Plan: 3 and Treatment may vary due to age or medical condition, Ondansetron and Dexamethasone  Airway Management Planned: LMA  Additional Equipment:   Intra-op Plan:   Post-operative Plan: Extubation in OR  Informed Consent: I  have reviewed the patients History and Physical, chart, labs and discussed the procedure including the risks, benefits and alternatives for the proposed anesthesia with the patient or authorized representative who has indicated his/her understanding and acceptance.     Dental advisory given  Plan Discussed with: CRNA and Anesthesiologist  Anesthesia Plan Comments:        Anesthesia Quick Evaluation

## 2020-10-09 ENCOUNTER — Encounter (HOSPITAL_COMMUNITY): Payer: Self-pay | Admitting: Urology

## 2020-10-09 LAB — SURGICAL PATHOLOGY

## 2020-10-10 ENCOUNTER — Telehealth: Payer: Self-pay | Admitting: Student in an Organized Health Care Education/Training Program

## 2020-10-10 ENCOUNTER — Encounter: Payer: Self-pay | Admitting: Student in an Organized Health Care Education/Training Program

## 2020-10-10 NOTE — Telephone Encounter (Signed)
Daughter left msg on phone asking for advise about pain medications post surgery. Please call Francee Gentile [phone]

## 2020-10-13 ENCOUNTER — Telehealth: Payer: Self-pay

## 2020-10-13 ENCOUNTER — Telehealth: Payer: Self-pay | Admitting: *Deleted

## 2020-10-13 NOTE — Telephone Encounter (Signed)
Patient s daughter called and wanted [place] to call her dad to talk about his pain in his feet and him feeling like he is going through withdrawals.  Called patient.  He states he is taking his pain medication as prescribed.  States he is not having any pain anywhere.  States he just does not want to be left alone.  Daughter states that she was going to call the surgeon and see if they have any suggestions.

## 2020-10-13 NOTE — Telephone Encounter (Signed)
Spoke to patient again and was advised that they have talked with the urologist and they do not feel that his symptoms would have anything to do with the procedure that he had done. Patient denies a fever, burning with urination, chest pain or any other symptoms. Patient stated that he does plan on starting back on his diabetic medication tomorrow morning. Patient's daughter was given ER precautions and she verbalized understanding. Patient said to make sure that he is called back tomorrow on his phone number.  This was discussed with Dr. Danise Mina.

## 2020-10-13 NOTE — Telephone Encounter (Signed)
Patient's daughter [name] left a voicemail stating that her dad had a surgical procedure done by a urologist Wednesday. [name] stated that her dad told her that he feels that he is going thru withdrawal.  [name] requested a call back.  Called [name] back and she was with her dad. Spoke to patient and was advised that he was without his Hydrocodone Wednesday and Thursday and started back on it Friday. Patient stated that he has not taken his diabetic medication since Wednesday because he has forgotten to take it. Patient is on Rybelsus. Patient stated that his non-fasting blood sugar this morning was 303. Patient stated that he does not want to be left alone and is not sleeping. Patient checked his blood sugar while on the phone and it is 228 and he ate around 12:30.  Patient stated that he plans on starting back on his diabetic medication in the morning.

## 2020-10-14 NOTE — Telephone Encounter (Signed)
Please check on him this morning--it does sound like he may have had some withdrawal (not sure why he went 2 days without the narcotic)

## 2020-10-14 NOTE — Telephone Encounter (Signed)
Spoke to pt. He said he is feeling better this morning.

## 2020-10-15 DIAGNOSIS — J8410 Pulmonary fibrosis, unspecified: Secondary | ICD-10-CM | POA: Diagnosis not present

## 2020-10-15 DIAGNOSIS — R0689 Other abnormalities of breathing: Secondary | ICD-10-CM | POA: Diagnosis not present

## 2020-10-17 DIAGNOSIS — C674 Malignant neoplasm of posterior wall of bladder: Secondary | ICD-10-CM | POA: Diagnosis not present

## 2020-10-27 ENCOUNTER — Telehealth: Payer: Self-pay

## 2020-10-27 NOTE — Telephone Encounter (Signed)
Patient called in to report the following:  Continued (not increased) weakness and malaise since having spot removed from his Bladder 3 weeks ago. Denies SOB and dizziness other than his long term baseline. No visual changes Denies Chest Pain No signs of bleeding bowel, bladder or skin No abnormal bruising or increase swelling No confusion, he is alert and oriented X 3  Patient states he continues to walk to road 2-3x day and occasionally has to stop and rest which isn't abnormal for him.    Main concern is ongoing weakness and wondering if his blood should be checked for low blood count and if he needs to be placed on iron supplementation.  Current BP:  150/76, HR: 71   Spoke with Dr. Einar Pheasant regarding patient's stable symptoms.  Because patient is determined stable, we have made him an appointment (acute) with Romilda Garret, NP for follow up on Wednesday 7/27 at 2pm.    Patient given instructions that if any symptoms progress and/or worsen such as weakness/fatigue, SOB or notes any signs of bleeding, or VS dropping to less than 100/60 or elevated HR over 100 then he should go to ER, otherwise keep appointment for Wednesday with [facility].    Dr. Einar Pheasant agrees with outcome.  Will cc to Romilda Garret, NP as an [place].

## 2020-10-28 ENCOUNTER — Telehealth: Payer: Self-pay | Admitting: Internal Medicine

## 2020-10-28 NOTE — Telephone Encounter (Signed)
Agree with plan 

## 2020-10-28 NOTE — Telephone Encounter (Signed)
Patient called states he has been having a lot of Gerd symptoms seeking advise.

## 2020-10-29 ENCOUNTER — Ambulatory Visit (INDEPENDENT_AMBULATORY_CARE_PROVIDER_SITE_OTHER): Payer: Medicare Other | Admitting: Nurse Practitioner

## 2020-10-29 ENCOUNTER — Other Ambulatory Visit: Payer: Self-pay

## 2020-10-29 ENCOUNTER — Encounter: Payer: Self-pay | Admitting: Nurse Practitioner

## 2020-10-29 VITALS — BP 128/76 | HR 78 | Temp 98.2°F | Resp 22 | Ht 72.0 in | Wt 191.0 lb

## 2020-10-29 DIAGNOSIS — N3090 Cystitis, unspecified without hematuria: Secondary | ICD-10-CM

## 2020-10-29 DIAGNOSIS — R531 Weakness: Secondary | ICD-10-CM | POA: Diagnosis not present

## 2020-10-29 DIAGNOSIS — R110 Nausea: Secondary | ICD-10-CM | POA: Diagnosis not present

## 2020-10-29 DIAGNOSIS — R300 Dysuria: Secondary | ICD-10-CM | POA: Insufficient documentation

## 2020-10-29 DIAGNOSIS — N3289 Other specified disorders of bladder: Secondary | ICD-10-CM

## 2020-10-29 LAB — GLUCOSE, POCT (MANUAL RESULT ENTRY): POC Glucose: 141 mg/dl — AB (ref 70–99)

## 2020-10-29 LAB — POCT URINALYSIS DIP (CLINITEK)
Bilirubin, UA: NEGATIVE
Glucose, UA: NEGATIVE mg/dL
Nitrite, UA: POSITIVE — AB
POC PROTEIN,UA: 100 — AB
Spec Grav, UA: 1.02 (ref 1.010–1.025)
Urobilinogen, UA: 0.2 E.U./dL
pH, UA: 6 (ref 5.0–8.0)

## 2020-10-29 MED ORDER — SULFAMETHOXAZOLE-TRIMETHOPRIM 800-160 MG PO TABS: 1.0000 | ORAL_TABLET | Freq: Two times a day (BID) | ORAL | 0 refills | Status: AC

## 2020-10-29 NOTE — Progress Notes (Signed)
Acute Office Visit  Subjective:    Patient ID: [name], male    DOB: 01-07-1932, 87 y.o.   MRN: [id number]  Chief Complaint  Patient presents with   Weakness    Since after having surgery beginning of July. Getting worse. NO chest pain. Has SOB but it is his baseline. Would like to check Iron levels and any other labs to make sure his blood levels are not too low   Patient is accompanied by his daughter.  Patient is in today for weakness that has increased since his bladder procedure. Nausea, wanting to sleep a lot Having some dysuria but states that it is improved. Also having urinary frequency, chills, nausea. Denies chest pain does indorse SHOB but that is his baseline per patient.  States he did reach out to his cardiologist but cannot get into the office for months. History of CHF and CABG. Has an appointment with urology next Monday 11/03/2020 for his next chemotherapy instillation.  [facility], Dr. Claudia Desanctis.  Patient is known type II diabetic.  On Rybelsus for glucose control.  States he checks his glucose at home Monday, Wednesday, and Fridays.  He believes his last glucose reading was 186.  This visit occurred during the SARS-CoV-2 public health emergency.  Safety protocols were in place, including screening questions prior to the visit, additional usage of staff PPE, and extensive cleaning of exam room while observing appropriate contact time as indicated for disinfecting solutions.   Past Medical History:  Diagnosis Date   Arthritis    osteoarthritis, s/p R TKR, and digits   CAD (coronary artery disease)    a. s/p CABG (2001)  b. s/p DES to RCA and cutting POBA to ostial PDA (2013)   c. s/p DES to SVG to OM2 (01/14/14) d. cath: 08/2015 NSTEMI w/ patent LIMA-LAD and 99% stenosis of SVG-OM w/ DES placed. CTO of SVG-RCA and SVG-D1.    Cataract    Chronic diastolic CHF (congestive heart failure) ([place])    a) 09/13 ECHO- LVEF 03-00%, grade 1 diastolic  dysfunction, mild LA dilatation, atrial septal aneurysm, AV mobility restricted, but no sig AS by doppler; b) 09/04/08 ECHO- LVH, ef 60%, mild AS, c. echo 08/2015: EF perserved of 55-60% with inferolateral HK. Mild AS noted.   Chronic kidney disease, stage III (moderate) ([name])    Chronic lower back pain    Colon polyps    COVID-19    Diverticulosis    Dyspnea 2009 since July -Sept   05/06/08-CPST-  normal effort, reduced VO2 max 20.5 /65%, reduced at 8.2/ 40%, normal breathing resetvca of 55%, submaximal heart rate response 112/77%, flattened o2 pluse response at peak exercise-12 ml/beat @ 85%, No VQ mismatch abnormalities, All c/w CIRC Limitation   Enlarged prostate    Esophageal stricture    a. s/p dilation spring 2010   GERD (gastroesophageal reflux disease)    Heart murmur    Hiatal hernia    History of carpal tunnel syndrome    Bilateral   History of kidney stones    History of PFTs    mixed pattern on spiro. mild restn on lung volumes with near normal DLCO. Pattern can be explained by CABG scar. Fev1 2.2L/73%, ratio 68 (67), TLC 4.7/68%,RV 1.5L/55%,DLCO 79%   Hyperlipidemia    Hypertension    Interstitial lung disease ([name])    NOS   Iron deficiency anemia    Nausea & vomiting    2018/2019   On home oxygen therapy  2 L [place] at bedtime   Osteoporosis    Overweight (BMI 25.0-29.9)    BMI 29   Peripheral neuropathy    RA (rheumatoid arthritis) ([place])    [name]   Seropositive rheumatoid arthritis ([place])    Type II diabetes mellitus ([place])    diet controlled   Wears glasses    Wears partial dentures    upper    Past Surgical History:  Procedure Laterality Date   BALLOON DILATION N/A 09/12/2018   Procedure: BALLOON DILATION;  Surgeon: Irene Shipper, MD;  Location: [facility];  Service: Endoscopy;  Laterality: N/A;   BALLOON DILATION N/A 02/20/2020   Procedure: BALLOON DILATION;  Surgeon: Irene Shipper, MD;  [facility];  Service: Endoscopy;  Laterality: N/A;    BOTOX INJECTION N/A 09/12/2018   Procedure: BOTOX INJECTION;  Surgeon: Irene Shipper, MD;  [facility];  Service: Endoscopy;  Laterality: N/A;   BOTOX INJECTION N/A 11/27/2018   Procedure: BOTOX INJECTION;  Surgeon: Irene Shipper, MD;  [facility];  Service: Endoscopy;  Laterality: N/A;   BOTOX INJECTION N/A 02/20/2020   Procedure: BOTOX INJECTION;  Surgeon: Irene Shipper, MD;  [facility];  Service: Endoscopy;  Laterality: N/A;   CARDIAC CATHETERIZATION  08/2004   CP- no MI, Cath- small vessell disease    CARDIAC CATHETERIZATION  12/31/2011   80% distal LM, 100% native LAD, LCx and RCA, 30% prox SVG-OM, SVG-D1 normal, 99% distal, 80% ostial SVG-RCA distal to graft, LIMA-LAD normal; LVEF mildly decreased with posterior basal AK    CARDIAC CATHETERIZATION  2009   with patent grafts/notes 12/31/2011   CARDIAC CATHETERIZATION N/A 08/13/2015   Procedure: Left Heart Cath and Cors/Grafts Angiography;  Surgeon: Sherren Mocha, MD;  Location: [facility];  Service: Cardiovascular;  Laterality: N/A;   CATARACT EXTRACTION W/ INTRAOCULAR LENS  IMPLANT, BILATERAL Bilateral    CHOLECYSTECTOMY OPEN  11/2003   [name]   CORONARY ANGIOPLASTY WITH STENT PLACEMENT  01/03/2012   Successful DES to SVG-RCA and cutting balloon angioplasty ostial  PDA    CORONARY ANGIOPLASTY WITH STENT PLACEMENT  01/14/2014   "1"   CORONARY ARTERY BYPASS GRAFT  11/1999   CABG X5   CORONARY STENT PLACEMENT  02/2012   1 stent and balloon   ESOPHAGEAL DILATION  11/27/2018   Procedure: ESOPHAGEAL DILATION;  Surgeon: Irene Shipper, MD;  [facility];  Service: Endoscopy;;   ESOPHAGOGASTRODUODENOSCOPY N/A 03/01/2017   Procedure: ESOPHAGOGASTRODUODENOSCOPY (EGD);  Surgeon: Irene Shipper, MD;  Location: [facility];  Service: Endoscopy;  Laterality: N/A;   ESOPHAGOGASTRODUODENOSCOPY (EGD) WITH ESOPHAGEAL DILATION  2010   ESOPHAGOGASTRODUODENOSCOPY (EGD) WITH PROPOFOL N/A 09/12/2018   Procedure:  ESOPHAGOGASTRODUODENOSCOPY (EGD) WITH PROPOFOL;  Surgeon: Irene Shipper, MD;  [facility];  Service: Endoscopy;  Laterality: N/A;   ESOPHAGOGASTRODUODENOSCOPY (EGD) WITH PROPOFOL N/A 11/27/2018   Procedure: ESOPHAGOGASTRODUODENOSCOPY (EGD) WITH PROPOFOL, WITH BALLOON DILATION;  Surgeon: Irene Shipper, MD;  [facility];  Service: Endoscopy;  Laterality: N/A;   ESOPHAGOGASTRODUODENOSCOPY (EGD) WITH PROPOFOL N/A 02/20/2020   Procedure: ESOPHAGOGASTRODUODENOSCOPY (EGD) WITH PROPOFOL;  Surgeon: Irene Shipper, MD;  [facility];  Service: Endoscopy;  Laterality: N/A;   HAND SURGERY     bilateral carpal tunnel releases   JOINT REPLACEMENT     KNEE ARTHROSCOPY Right 2008   LEFT AND RIGHT HEART CATHETERIZATION WITH CORONARY ANGIOGRAM  12/31/2011   Procedure: LEFT AND RIGHT HEART CATHETERIZATION WITH CORONARY ANGIOGRAM;  Surgeon:  Burnell Blanks, MD;  Location: [facility] CATH LAB;  Service: Cardiovascular;;   LEFT AND RIGHT HEART CATHETERIZATION WITH CORONARY ANGIOGRAM N/A 01/14/2014   Procedure: LEFT AND RIGHT HEART CATHETERIZATION WITH CORONARY ANGIOGRAM;  Surgeon: Peter M Martinique, MD;  Location: [facility] CATH LAB;  Service: Cardiovascular;  Laterality: N/A;   MALONEY DILATION  03/01/2017   Procedure: Venia Minks DILATION;  Surgeon: Irene Shipper, MD;  [facility];  Service: Endoscopy;;   PERCUTANEOUS CORONARY INTERVENTION-BALLOON ONLY  01/03/2012   Procedure: PERCUTANEOUS CORONARY INTERVENTION-BALLOON ONLY;  Surgeon: Peter M Martinique, MD;  Location: [facility] CATH LAB;  Service: Cardiovascular;;   PERCUTANEOUS CORONARY STENT INTERVENTION (PCI-S)  12/31/2011   Procedure: PERCUTANEOUS CORONARY STENT INTERVENTION (PCI-S);  Surgeon: Burnell Blanks, MD;  Location: [facility] CATH LAB;  Service: Cardiovascular;;   PERCUTANEOUS CORONARY STENT INTERVENTION (PCI-S) N/A 01/03/2012   Procedure: PERCUTANEOUS CORONARY STENT INTERVENTION (PCI-S);  Surgeon: Peter M Martinique, MD;  Location: [facility] CATH LAB;   Service: Cardiovascular;  Laterality: N/A;   SHOULDER ARTHROSCOPY WITH OPEN ROTATOR CUFF REPAIR AND DISTAL CLAVICLE ACROMINECTOMY Left 02/27/2013   Procedure: LEFT SHOULDER ARTHROSCOPY WITH MINI OPEN ROTATOR CUFF REPAIR AND SUBACROMIAL DECOMPRESSION AND DISTAL CLAVICLE RESECTION;  Surgeon: [name], MD;  Location: [facility] [place];  Service: Orthopedics;  Laterality: Left;   TOTAL KNEE ARTHROPLASTY Right 03/2010   [name]   TRANSURETHRAL RESECTION OF BLADDER TUMOR WITH MITOMYCIN-C N/A 10/08/2020   Procedure: TRANSURETHRAL RESECTION OF BLADDER TUMOR WITH GEMCITABINE;  Surgeon: Robley Fries, MD;  Location: [facility];  Service: Urology;  Laterality: N/A;  75 MINS   TRIGGER FINGER RELEASE Left 02/27/2013   Procedure: RELEASE TRIGGER FINGER/A-1 PULLEY;  Surgeon: [name], MD;  Location: [facility];  Service: Orthopedics;  Laterality: Left;    Family History  Problem Relation Age of Onset   COPD Mother    Heart disease Father    Heart attack Father    Stomach cancer Brother    Stroke Sister    Alcohol abuse Sister    Colon cancer Brother 40   Diabetes Brother    Rectal cancer Neg Hx     Social History   Socioeconomic History   Marital status: Married    Spouse name: Not on file   Number of children: 3   Years of education: Not on file   Highest education level: Not on file  Occupational History   Occupation: Designer, jewellery: RETIRED    Comment: retired  Tobacco Use   Smoking status: Former    Packs/day: 1.00    Years: 20.00    Pack years: 20.00    Types: Cigarettes    Quit date: 04/06/1963    Years since quitting: 74.6   Smokeless tobacco: Former  Scientific laboratory technician Use: Never used  Substance and Sexual Activity   Alcohol use: No    Alcohol/week: 0.0 standard drinks    Comment: 01/01/2012 "last alcohol ~ 50 yr ago"   Drug use: No   Sexual activity: Not Currently  Other Topics Concern   Not on file  Social History Narrative   No living will   Requests wife  as health care POA-- alternate is daughter Hassan Rowan   Discussed DNR --he requests this (done 08/29/12)   Not sure about feeding tube---but might accept for some time   Patient lives with wife and daughter in a one story home.  Has 3 children.  Retired from working in Teacher, adult education care. Education: 9th  grade.   Social Determinants of Health   Financial Resource Strain: Low Risk    Difficulty of Paying Living Expenses: Not very hard  Food Insecurity: Not on file  Transportation Needs: Not on file  Physical Activity: Not on file  Stress: Not on file  Social Connections: Not on file  Intimate Partner Violence: Not on file    Outpatient Medications Prior to Visit  Medication Sig Dispense Refill   amLODipine (NORVASC) 5 MG tablet Take 5 mg by mouth at bedtime.     aspirin EC 81 MG tablet Take 81 mg by mouth at bedtime.     Blood Glucose Monitoring Suppl (ONE TOUCH ULTRA 2) w/Device KIT Use to obtain blood sugar daily. Dx Code E11.40 1 kit 0   carvedilol (COREG) 6.25 MG tablet Take 6.25 mg by mouth 2 (two) times daily.     clopidogrel (PLAVIX) 75 MG tablet Take 1 tablet (75 mg total) by mouth daily. (Patient taking differently: Take 75 mg by mouth at bedtime.) 90 tablet 3   DHA-Vitamin C-Lutein (EYE HEALTH FORMULA PO) Take 1 tablet by mouth daily.      glucose blood (ONE TOUCH ULTRA TEST) test strip USE TO CHECK BLOOD SUGAR ONCE A DAY Dx Code E11.40 100 each 3   HYDROcodone-acetaminophen (NORCO) 10-325 MG tablet Take 1-2 tablets by mouth 2 (two) times daily as needed for severe pain. Must last 30 days. 120 tablet 0   [START ON 11/09/2020] HYDROcodone-acetaminophen (NORCO) 10-325 MG tablet Take 1-2 tablets by mouth 2 (two) times daily as needed for severe pain. Must last 30 days. 120 tablet 0   losartan (COZAAR) 25 MG tablet Take 25 mg by mouth every evening.     Multiple Vitamins-Minerals (EYE MULTIVITAMIN PO) Take 1 tablet by mouth daily.     nitroGLYCERIN (NITROSTAT) 0.4 MG SL tablet DISSOLVE 1 TABLET  UNDER TONGUE AS NEEDEDFOR CHEST PAIN. MAY REPEAT 5 MINUTES APART 3 TIMES IF NEEDED. IF NO RELIEF CALL 911 25 tablet 0   OneTouch Delica Lancets 90Z MISC 1 each by In Vitro route daily. Dx Code E11.49 100 each 3   OXYGEN Inhale into the lungs. Per pt- Uses Oxygen 2 liters at night.     pantoprazole (PROTONIX) 40 MG tablet TAKE 1 TABLET BY MOUTH TWICE (2) DAILY 180 tablet 3   polyethylene glycol (MIRALAX / GLYCOLAX) 17 g packet Take 51 g by mouth every other day.      pregabalin (LYRICA) 50 MG capsule Take 1 capsule (50 mg total) by mouth at bedtime. 90 capsule 0   Semaglutide (RYBELSUS) 3 MG TABS Take 1 tablet by mouth daily. 30 tablet 11   tamsulosin (FLOMAX) 0.4 MG CAPS capsule Take 0.4 mg by mouth at bedtime. Takes Monday, Wednesday, Friday     furosemide (LASIX) 40 MG tablet Take 1 tablet (40 mg total) by mouth every Monday, Wednesday, and Friday. 12 tablet 0   No facility-administered medications prior to visit.    Allergies  Allergen Reactions   Doxazosin Mesylate Other (See Comments)    dizziness   Methocarbamol Rash    Review of Systems  Constitutional:  Positive for chills. Negative for fever.  Respiratory:  Positive for shortness of breath.   Cardiovascular:  Negative for chest pain and leg swelling.  Gastrointestinal:  Positive for abdominal pain and nausea. Negative for diarrhea and vomiting.  Genitourinary:  Positive for dysuria and frequency. Negative for decreased urine volume, hematuria and urgency.  Neurological:  Positive for weakness.  Objective:    Physical Exam Vitals and nursing note reviewed.  Constitutional:      Appearance: Normal appearance.  Cardiovascular:     Rate and Rhythm: Normal rate and regular rhythm.     Heart sounds: Murmur heard.  Pulmonary:     Effort: Pulmonary effort is normal.     Breath sounds: Normal breath sounds.  Abdominal:     General: Bowel sounds are normal.     Tenderness: There is abdominal tenderness. There is no right  CVA tenderness or left CVA tenderness.     Comments: Suprapubic tenderness.  Musculoskeletal:     Comments: 5/5 strength bilateral upper and bilateral lower extremities.  Lymphadenopathy:     Cervical: No cervical adenopathy.  Skin:    General: Skin is warm.  Neurological:     Mental Status: He is alert. Mental status is at baseline.     Deep Tendon Reflexes:     Reflex Scores:      Bicep reflexes are 1+ on the right side and 1+ on the left side.      Patellar reflexes are 1+ on the right side and 1+ on the left side.   BP 128/76   Pulse 78   Temp 98.2 F (36.8 C)   Resp (!) 22   Ht 6' (1.829 m)   Wt 191 lb (86.6 kg)   SpO2 96%   BMI 25.90 kg/m  Wt Readings from Last 3 Encounters:  10/29/20 191 lb (86.6 kg)  10/08/20 194 lb 7.1 oz (88.2 kg)  09/29/20 194 lb 6 oz (88.2 kg)    Health Maintenance Due  Topic Date Due   Zoster Vaccines- Shingrix (1 of 2) Never done   COVID-19 Vaccine (3 - Pfizer risk series) 06/14/2019    There are no preventive care reminders to display for this patient.   Lab Results  Component Value Date   TSH 2.100 04/08/2017   Lab Results  Component Value Date   WBC 9.0 09/29/2020   HGB 13.0 09/29/2020   HCT 40.2 09/29/2020   MCV 85.4 09/29/2020   PLT 197 09/29/2020   Lab Results  Component Value Date   NA 136 09/29/2020   K 4.9 09/29/2020   CO2 29 09/29/2020   GLUCOSE 185 (H) 09/29/2020   BUN 35 (H) 09/29/2020   CREATININE 1.91 (H) 09/29/2020   BILITOT 0.8 06/13/2019   ALKPHOS 58 06/13/2019   AST 14 06/13/2019   ALT 9 06/13/2019   PROT 8.0 06/13/2019   ALBUMIN 4.1 06/13/2019   ALBUMIN 4.1 06/13/2019   CALCIUM 9.4 09/29/2020   ANIONGAP 6 09/29/2020   GFR 30.78 (L) 06/13/2019   Lab Results  Component Value Date   CHOL 209 (H) 06/13/2019   Lab Results  Component Value Date   HDL 35.50 (L) 06/13/2019   Lab Results  Component Value Date   LDLCALC 43 10/14/2016   Lab Results  Component Value Date   TRIG 275.0 (H)  06/13/2019   Lab Results  Component Value Date   CHOLHDL 6 06/13/2019   Lab Results  Component Value Date   HGBA1C 7.9 (A) 09/19/2020       Assessment & Plan:   Problem List Items Addressed This Visit       Genitourinary   Cystitis    Obtain clean-catch urine in office.  UA showed 3+ leukocytes, positive nitrites, 3+ red blood cells.  Given recent intervention with cystoscopy and indwelling catheter will treat and send off for culture.  I did reach out to [facility] urology and spoke with Dr. Donell Sievert nurse.  Gave verbal report of UA findings and that I was going to treat for a UTI.  She stated she would send a message to Dr. Claudia Desanctis. Patient and daughter concerned as he supposed to have an appointment on Monday for chemotherapy infusion to his bladder. Will start Bactrim DS twice daily for 10 days.  Did take renal function into account using chronic cough Gault formula patient's creatinine clearance is 33 and renal dosing.  Begins at creatinine clearance of 30.  Did call and speak to assist and verified appropriate dosing.       Relevant Orders   Urine Culture     Other   Nausea    Given UA, I believe is related to infection.  Assured patient was antibiotics are on board he should start feeling better.  If not please call the office or message MyChart for further instructions and/or medications.       Bladder mass    Had a cystoscopy on 10-08-2020 through [facility], Dr. Claudia Desanctis.  Since then patient has had global weakness that surpassed this is normal.  Has reached out to urology office and they did not think it was the medication or procedure.  Patient and daughter also reached out to cardiology to make an appointment but will schedule several months out.  They recommended he see his PCP.       Weakness - Primary    Patient endorses above average global weakness since urological intervention 10-08-2020.  Has reached out to specialist and referred to see PCP. Did perform ECG in  office.  I reviewed normal sinus rhythm compared to previous ECG, no acute change.       Relevant Orders   POCT URINALYSIS DIP (CLINITEK) (Completed)   Glucose (CBG) (Completed)   Comprehensive metabolic panel   CBC with Differential/Platelet   EKG 12-Lead (Completed)     No orders of the defined types were placed in this encounter.  Follow-up as scheduled with Dr. Williemae Area, NP

## 2020-10-29 NOTE — Assessment & Plan Note (Signed)
Had a cystoscopy on 10-08-2020 through [facility], Dr. Claudia Desanctis.  Since then patient has had global weakness that surpassed this is normal.  Has reached out to urology office and they did not think it was the medication or procedure.  Patient and daughter also reached out to cardiology to make an appointment but will schedule several months out.  They recommended he see his PCP.

## 2020-10-29 NOTE — Assessment & Plan Note (Addendum)
Obtain clean-catch urine in office.  UA showed 3+ leukocytes, positive nitrites, 3+ red blood cells.  Given recent intervention with cystoscopy and indwelling catheter will treat and send off for culture.  I did reach out to [facility] urology and spoke with Dr. Donell Sievert nurse.  Gave verbal report of UA findings and that I was going to treat for a UTI.  She stated she would send a message to Dr. Claudia Desanctis. Patient and daughter concerned as he supposed to have an appointment on Monday for chemotherapy infusion to his bladder. Will start Bactrim DS twice daily for 10 days.  Did take renal function into account using chronic cough Gault formula patient's creatinine clearance is 33 and renal dosing.  Begins at creatinine clearance of 30.  Did call and speak to assist and verified appropriate dosing.

## 2020-10-29 NOTE — Assessment & Plan Note (Signed)
Given UA, I believe is related to infection.  Assured patient was antibiotics are on board he should start feeling better.  If not please call the office or message MyChart for further instructions and/or medications.

## 2020-10-29 NOTE — Assessment & Plan Note (Addendum)
Patient endorses above average global weakness since urological intervention 10-08-2020.  Has reached out to specialist and referred to see PCP. Did perform ECG in office.  I reviewed normal sinus rhythm compared to previous ECG, no acute change.

## 2020-10-29 NOTE — Patient Instructions (Signed)
I will reach out to Dr. Jacalyn Lefevre about your urine I will send antibiotics to your pharmacy after I speak to Dr. Claudia Desanctis. Follow up as scheduled. Sooner if needed

## 2020-10-29 NOTE — Telephone Encounter (Signed)
Patient returned your call, please call patient one more time.   

## 2020-10-29 NOTE — Telephone Encounter (Signed)
Left message for pt to call back.  Pt states he is having a lot of gas/burping. He states he is taking his protonix BID but wanted to know if there is something else he can try. Discussed with pt that he might try gas-x that is available over the counter. Pt states he will try this.

## 2020-10-30 LAB — COMPREHENSIVE METABOLIC PANEL
ALT: 8 U/L (ref 0–53)
AST: 13 U/L (ref 0–37)
Albumin: 3.7 g/dL (ref 3.5–5.2)
Alkaline Phosphatase: 64 U/L (ref 39–117)
BUN: 23 mg/dL (ref 6–23)
CO2: 30 mEq/L (ref 19–32)
Calcium: 10.1 mg/dL (ref 8.4–10.5)
Chloride: 98 mEq/L (ref 96–112)
Creatinine, Ser: 2 mg/dL — ABNORMAL HIGH (ref 0.40–1.50)
GFR: 29.52 mL/min — ABNORMAL LOW (ref >60.00)
Glucose, Bld: 111 mg/dL — ABNORMAL HIGH (ref 70–99)
Potassium: 4.9 mEq/L (ref 3.5–5.1)
Sodium: 137 mEq/L (ref 135–145)
Total Bilirubin: 0.4 mg/dL (ref 0.2–1.2)
Total Protein: 7.7 g/dL (ref 6.0–8.3)

## 2020-10-30 LAB — CBC WITH DIFFERENTIAL/PLATELET
Basophils Absolute: 0.2 10*3/uL — ABNORMAL HIGH (ref 0.0–0.1)
Basophils Relative: 1.8 % (ref 0.0–3.0)
Eosinophils Absolute: 0.5 10*3/uL (ref 0.0–0.7)
Eosinophils Relative: 5.7 % — ABNORMAL HIGH (ref 0.0–5.0)
HCT: 37 % — ABNORMAL LOW (ref 39.0–52.0)
Hemoglobin: 12.2 g/dL — ABNORMAL LOW (ref 13.0–17.0)
Lymphocytes Relative: 28.1 % (ref 12.0–46.0)
Lymphs Abs: 2.6 10*3/uL (ref 0.7–4.0)
MCHC: 33.1 g/dL (ref 30.0–36.0)
MCV: 82.4 fl (ref 78.0–100.0)
Monocytes Absolute: 0.9 10*3/uL (ref 0.1–1.0)
Monocytes Relative: 9.2 % (ref 3.0–12.0)
Neutro Abs: 5.2 10*3/uL (ref 1.4–7.7)
Neutrophils Relative %: 55.2 % (ref 43.0–77.0)
Platelets: 347 10*3/uL (ref 150.0–400.0)
RBC: 4.49 Mil/uL (ref 4.22–5.81)
RDW: 14.6 % (ref 11.5–15.5)
WBC: 9.3 10*3/uL (ref 4.0–10.5)

## 2020-10-31 ENCOUNTER — Encounter: Payer: Self-pay | Admitting: Nurse Practitioner

## 2020-10-31 LAB — URINE CULTURE
MICRO NUMBER:: 12169604
SPECIMEN QUALITY:: ADEQUATE

## 2020-11-03 DIAGNOSIS — R809 Proteinuria, unspecified: Secondary | ICD-10-CM | POA: Diagnosis not present

## 2020-11-03 DIAGNOSIS — I2510 Atherosclerotic heart disease of native coronary artery without angina pectoris: Secondary | ICD-10-CM | POA: Diagnosis not present

## 2020-11-03 DIAGNOSIS — N3289 Other specified disorders of bladder: Secondary | ICD-10-CM | POA: Diagnosis not present

## 2020-11-03 DIAGNOSIS — C674 Malignant neoplasm of posterior wall of bladder: Secondary | ICD-10-CM | POA: Diagnosis not present

## 2020-11-03 DIAGNOSIS — Z5111 Encounter for antineoplastic chemotherapy: Secondary | ICD-10-CM | POA: Diagnosis not present

## 2020-11-03 DIAGNOSIS — K222 Esophageal obstruction: Secondary | ICD-10-CM | POA: Diagnosis not present

## 2020-11-03 DIAGNOSIS — N390 Urinary tract infection, site not specified: Secondary | ICD-10-CM | POA: Diagnosis not present

## 2020-11-03 DIAGNOSIS — N184 Chronic kidney disease, stage 4 (severe): Secondary | ICD-10-CM | POA: Diagnosis not present

## 2020-11-03 DIAGNOSIS — E1122 Type 2 diabetes mellitus with diabetic chronic kidney disease: Secondary | ICD-10-CM | POA: Diagnosis not present

## 2020-11-03 DIAGNOSIS — I129 Hypertensive chronic kidney disease with stage 1 through stage 4 chronic kidney disease, or unspecified chronic kidney disease: Secondary | ICD-10-CM | POA: Diagnosis not present

## 2020-11-04 ENCOUNTER — Ambulatory Visit: Payer: Medicare Other | Admitting: Physician Assistant

## 2020-11-10 DIAGNOSIS — C674 Malignant neoplasm of posterior wall of bladder: Secondary | ICD-10-CM | POA: Diagnosis not present

## 2020-11-10 DIAGNOSIS — Z5111 Encounter for antineoplastic chemotherapy: Secondary | ICD-10-CM | POA: Diagnosis not present

## 2020-11-10 DIAGNOSIS — R8271 Bacteriuria: Secondary | ICD-10-CM | POA: Diagnosis not present

## 2020-11-14 ENCOUNTER — Other Ambulatory Visit: Payer: Self-pay | Admitting: Internal Medicine

## 2020-11-15 DIAGNOSIS — J8410 Pulmonary fibrosis, unspecified: Secondary | ICD-10-CM | POA: Diagnosis not present

## 2020-11-15 DIAGNOSIS — R0689 Other abnormalities of breathing: Secondary | ICD-10-CM | POA: Diagnosis not present

## 2020-11-20 ENCOUNTER — Telehealth: Payer: Self-pay | Admitting: Podiatry

## 2020-11-20 ENCOUNTER — Encounter: Payer: Self-pay | Admitting: Podiatry

## 2020-11-20 NOTE — Telephone Encounter (Signed)
Called patient lvm to reschedule 9/26 appt and sent reschedule letter

## 2020-11-24 DIAGNOSIS — C674 Malignant neoplasm of posterior wall of bladder: Secondary | ICD-10-CM | POA: Diagnosis not present

## 2020-11-24 DIAGNOSIS — Z5111 Encounter for antineoplastic chemotherapy: Secondary | ICD-10-CM | POA: Diagnosis not present

## 2020-11-27 ENCOUNTER — Telehealth: Payer: Self-pay

## 2020-11-27 ENCOUNTER — Other Ambulatory Visit: Payer: Self-pay | Admitting: Student in an Organized Health Care Education/Training Program

## 2020-11-27 DIAGNOSIS — M47816 Spondylosis without myelopathy or radiculopathy, lumbar region: Secondary | ICD-10-CM

## 2020-11-27 DIAGNOSIS — M792 Neuralgia and neuritis, unspecified: Secondary | ICD-10-CM

## 2020-11-27 DIAGNOSIS — G894 Chronic pain syndrome: Secondary | ICD-10-CM

## 2020-11-27 NOTE — Telephone Encounter (Signed)
[name], from [place], l/m on triage line on behalf of patient. Patient has reached Medicare gap-donut hole, his Rybelsus used to cost $47 and now will cost over $238 for 30 tablets and patient wanted to let Dr Silvio Pate know that he will not be able to afford this right now.  Any other suggestions or recommendations? Please let patient know. Thank you

## 2020-11-27 NOTE — Telephone Encounter (Signed)
He is in the donut hole so his part D plan is not covering medications until the 1st of the year. The new injectables are not cheap. I can look in to the patient assistance program for the Rybelsus to see if they have a [employer]. I will work on that tomorrow.

## 2020-11-27 NOTE — Chronic Care Management (AMB) (Addendum)
Chronic Care Management Pharmacy Assistant   Name: [name]  MRN: [id number] DOB: 1931-10-28  Reason for Encounter: Diabetes Review  Recent office visits:  10/29/20 - Family Medicine - Patient presented for pain and weakness after bladder procedure. Labs ordered(glucose,creatinine elevated). UA abnormal. Will start Bactrim DS twice daily for 10 days. Urology referral. 09/19/20 - PCP - Patient presented for follow up diabetes.Labs ordered (A1c 7.9). No medication changes. Follow up 3 months. 08/26/20 - PCP - Patient presented for abdominal pain, dysuria. UA ordered. CT-non contrast. No medication changes.  Recent consult visits:  09/29/20 - [facility] pain management - Patient presented for follow up from Lumbar Radiofrequency ablation. Refilled hydrocodone 10/325mg  take 1-2 tablets every 6-8 hours as needed 09/22/20 - Podiatry - Patient presented for toenail trimming. No medication changes. 09/09/20 - [facility] - Patient presents for follow up Lumbar pain management. UDS, refilled hydrocodone and lyrica.  Hospital visits:  10/08/20 - [facility] - Cystoscopy  Medications: Outpatient Encounter Medications as of 11/27/2020  Medication Sig Note   amLODipine (NORVASC) 5 MG tablet Take 5 mg by mouth at bedtime.    aspirin EC 81 MG tablet Take 81 mg by mouth at bedtime.    Blood Glucose Monitoring Suppl (ONE TOUCH ULTRA 2) w/Device KIT Use to obtain blood sugar daily. Dx Code E11.40    carvedilol (COREG) 6.25 MG tablet Take 6.25 mg by mouth 2 (two) times daily.    clopidogrel (PLAVIX) 75 MG tablet Take 1 tablet (75 mg total) by mouth daily. (Patient taking differently: Take 75 mg by mouth at bedtime.)    DHA-Vitamin C-Lutein (EYE HEALTH FORMULA PO) Take 1 tablet by mouth daily.     furosemide (LASIX) 40 MG tablet Take 1 tablet (40 mg total) by mouth every Monday, Wednesday, and Friday.    glucose blood (ONE TOUCH ULTRA TEST) test strip USE TO CHECK BLOOD SUGAR ONCE A DAY Dx  Code E11.40    HYDROcodone-acetaminophen (NORCO) 10-325 MG tablet Take 1-2 tablets by mouth 2 (two) times daily as needed for severe pain. Must last 30 days. 5/72/6203: Duplicate. Hasn't started.   losartan (COZAAR) 25 MG tablet Take 25 mg by mouth every evening.    Multiple Vitamins-Minerals (EYE MULTIVITAMIN PO) Take 1 tablet by mouth daily.    nitroGLYCERIN (NITROSTAT) 0.4 MG SL tablet DISSOLVE 1 TABLET UNDER TONGUE AS NEEDEDFOR CHEST PAIN. MAY REPEAT 5 MINUTES APART 3 TIMES IF NEEDED. IF NO RELIEF CALL 559    OneTouch Delica Lancets 74B MISC 1 each by In Vitro route daily. Dx Code E11.49    OXYGEN Inhale into the lungs. Per pt- Uses Oxygen 2 liters at night.    pantoprazole (PROTONIX) 40 MG tablet TAKE 1 TABLET BY MOUTH TWICE (2) DAILY    polyethylene glycol (MIRALAX / GLYCOLAX) 17 g packet Take 51 g by mouth every other day.     pregabalin (LYRICA) 50 MG capsule Take 1 capsule (50 mg total) by mouth at bedtime.    Semaglutide (RYBELSUS) 3 MG TABS Take 1 tablet by mouth daily.    tamsulosin (FLOMAX) 0.4 MG CAPS capsule Take 0.4 mg by mouth at bedtime. Takes Monday, Wednesday, Friday    No facility-administered encounter medications on file as of 11/27/2020.    Recent Relevant Labs: Lab Results  Component Value Date/Time   HGBA1C 7.9 (A) 09/19/2020 01:37 PM   HGBA1C 8.9 (H) 04/11/2020 03:17 AM   HGBA1C 8.1 (A) 12/14/2019 12:17 PM   HGBA1C 8.0 (  H) 06/13/2019 10:57 AM   MICROALBUR 7.5 (H) 04/28/2018 12:28 PM   MICROALBUR 0.5 02/04/2009 10:09 AM    Kidney Function Lab Results  Component Value Date/Time   CREATININE 2.00 (H) 10/29/2020 03:08 PM   CREATININE 1.91 (H) 09/29/2020 01:16 PM   CREATININE 2.28 (H) 06/24/2017 11:36 AM   CREATININE 2.43 (H) 05/27/2017 02:09 PM   GFR 29.52 (L) 10/29/2020 03:08 PM   GFRNONAA 34 (L) 09/29/2020 01:16 PM   GFRNONAA 26 (L) 06/24/2017 11:36 AM   GFRAA 30 (L) 06/24/2017 11:36 AM    Attempted contact with [name] 3 times on 11/27/20,  12/01/20, 12/02/20. Unsuccessful outreach. Will attempt contact next month.   Current antihyperglycemic regimen:  Rybelsus 3 mg - take 1 tablet daily on empty stomach with full glass of water  PCP appointment on 12/26/20   [name], CPP notified  [name], [place] Assistant [phone]  I have reviewed the care management and care coordination activities outlined in this encounter and I am certifying that I agree with the content of this note. No further action required.  [name], PharmD Clinical Pharmacist [facility] [phone]

## 2020-12-01 ENCOUNTER — Encounter: Payer: Medicare Other | Admitting: Student in an Organized Health Care Education/Training Program

## 2020-12-01 DIAGNOSIS — Z5111 Encounter for antineoplastic chemotherapy: Secondary | ICD-10-CM | POA: Diagnosis not present

## 2020-12-01 DIAGNOSIS — C674 Malignant neoplasm of posterior wall of bladder: Secondary | ICD-10-CM | POA: Diagnosis not present

## 2020-12-01 NOTE — Telephone Encounter (Signed)
Spoke to pt on Friday (8-26) about information I needed from him to complete the patient assistance program forms. I have the forms at my desk in a folder labeled "Waiting on Patient".  One issue I see is he is on 3mg  and they only have 3 mg as a titration to 7mg . I am not sure if these are scored and he cut the 7mg  in half or do his numbers reflect the need to increase to the 7mg ?

## 2020-12-01 NOTE — Telephone Encounter (Signed)
Awesome! He just dropped off the information I was needing. I will work on the application.

## 2020-12-01 NOTE — Telephone Encounter (Signed)
Forms filled out and faxed to [employer]. Forms are at my desk under the shred file if there are any issues with it.

## 2020-12-02 ENCOUNTER — Other Ambulatory Visit: Payer: Self-pay

## 2020-12-02 ENCOUNTER — Encounter: Payer: Self-pay | Admitting: Student in an Organized Health Care Education/Training Program

## 2020-12-02 ENCOUNTER — Ambulatory Visit
Payer: Medicare Other | Attending: Student in an Organized Health Care Education/Training Program | Admitting: Student in an Organized Health Care Education/Training Program

## 2020-12-02 VITALS — BP 135/68 | HR 73 | Temp 98.0°F | Resp 18 | Ht 72.0 in | Wt 196.0 lb

## 2020-12-02 DIAGNOSIS — G8929 Other chronic pain: Secondary | ICD-10-CM | POA: Diagnosis not present

## 2020-12-02 DIAGNOSIS — E1142 Type 2 diabetes mellitus with diabetic polyneuropathy: Secondary | ICD-10-CM | POA: Diagnosis not present

## 2020-12-02 DIAGNOSIS — M792 Neuralgia and neuritis, unspecified: Secondary | ICD-10-CM | POA: Insufficient documentation

## 2020-12-02 DIAGNOSIS — G894 Chronic pain syndrome: Secondary | ICD-10-CM | POA: Diagnosis not present

## 2020-12-02 DIAGNOSIS — E1122 Type 2 diabetes mellitus with diabetic chronic kidney disease: Secondary | ICD-10-CM | POA: Diagnosis not present

## 2020-12-02 DIAGNOSIS — M79605 Pain in left leg: Secondary | ICD-10-CM | POA: Diagnosis not present

## 2020-12-02 DIAGNOSIS — N183 Chronic kidney disease, stage 3 unspecified: Secondary | ICD-10-CM | POA: Diagnosis not present

## 2020-12-02 DIAGNOSIS — M79604 Pain in right leg: Secondary | ICD-10-CM | POA: Diagnosis not present

## 2020-12-02 DIAGNOSIS — M47816 Spondylosis without myelopathy or radiculopathy, lumbar region: Secondary | ICD-10-CM | POA: Diagnosis not present

## 2020-12-02 MED ORDER — PREGABALIN 50 MG PO CAPS
50.0000 mg | ORAL_CAPSULE | Freq: Every day | ORAL | 2 refills | Status: DC
Start: 2020-12-02 — End: 2021-03-05

## 2020-12-02 MED ORDER — HYDROCODONE-ACETAMINOPHEN 10-325 MG PO TABS: 1.0000 | ORAL_TABLET | Freq: Two times a day (BID) | ORAL | 0 refills | Status: AC | PRN

## 2020-12-02 MED ORDER — HYDROCODONE-ACETAMINOPHEN 10-325 MG PO TABS: 1.0000 | ORAL_TABLET | Freq: Two times a day (BID) | ORAL | 0 refills | Status: DC | PRN

## 2020-12-02 NOTE — Progress Notes (Signed)
PROVIDER NOTE: Information contained herein reflects review and annotations entered in association with encounter. Interpretation of such information and data should be left to medically-trained personnel. Information provided to patient can be located elsewhere in the medical record under "Patient Instructions". Document created using STT-dictation technology, any transcriptional errors that may result from process are unintentional.    Patient: [name]  Service Category: E/M  Provider: Gillis Santa, MD  DOB: [birth year]  DOS: 12/02/2020  Specialty: Interventional Pain Management  MRN: [id number]  Setting: Ambulatory outpatient  PCP: [name], MD  Type: Established Patient    Referring Provider: [name], MD  Location: Office  Delivery: Face-to-face     HPI  [name], a 87 y.o. year old male, is here today because of his Lumbar facet arthropathy [M47.816]. [name] primary complain today is Foot Pain (bilateral) Last encounter: My last encounter with him was on 07/23/2020. Pertinent problems: [name] has CKD (chronic kidney disease) stage 4, GFR 15-29 ml/min ([place]); Spinal stenosis of lumbar region without neurogenic claudication; Lumbar facet arthropathy; Chronic pain of both lower extremities; Neuropathic pain; and Chronic pain syndrome on their pertinent problem list. Pain Assessment: Severity of Chronic pain is reported as a 3 /10. Location: Foot Right, Left/ . Onset: More than a month ago. Quality: Sharp. Timing: Constant. Modifying factor(s): meds. Vitals:  height is 6' (1.829 m) and weight is 196 lb (88.9 kg). His temporal temperature is 98 F (36.7 C). His blood pressure is 135/68 and his pulse is 73. His respiration is 18 and oxygen saturation is 98%.   Reason for encounter: medication management.    Patient presents today for medication management.  Is undergoing chemotherapy for bladder cancer.  Continues to have persistent low back pain as well.   Is also endorsing bilateral burning foot pain.  This is related to diabetic polyneuropathy.  We discussed Qutenza, capsaicin 8% topical system.  Recent benefits were reviewed.  Patient states that he would like to consider this after he finishes chemotherapy.  This is reasonable.  I will refill his Lyrica and hydrocodone as below.  No change in dose.   Pharmacotherapy Assessment  Analgesic: Hydrocodone 10 to 20 mg twice daily as needed    Monitoring: [place] PMP: PDMP reviewed during this encounter.       Pharmacotherapy: No side-effects or adverse reactions reported. Compliance: No problems identified. Effectiveness: Clinically acceptable.  UDS:  Summary  Date Value Ref Range Status  09/09/2020 Note  Final    Comment:    ==================================================================== [employer] Select 13 (MW) ==================================================================== Test                             Result       Flag       Units  Drug Present and Declared for Prescription Verification   Hydrocodone                    2490         EXPECTED   ng/mg creat   Hydromorphone                  2240         EXPECTED   ng/mg creat   Dihydrocodeine                 352          EXPECTED   ng/mg creat  Norhydrocodone                 1583         EXPECTED   ng/mg creat    Sources of hydrocodone include scheduled prescription medications.    Hydromorphone, dihydrocodeine and norhydrocodone are expected    metabolites of hydrocodone. Hydromorphone and dihydrocodeine are    also available as scheduled prescription medications.  ==================================================================== Test                      Result    Flag   Units      Ref Range   Creatinine              90               mg/dL      >=20 ==================================================================== Declared Medications:  The flagging and interpretation on this report are based on the  following  declared medications.  Unexpected results may arise from  inaccuracies in the declared medications.   **Note: The testing scope of this panel includes these medications:   Hydrocodone (Norco)   **Note: The testing scope of this panel does not include the  following reported medications:   Acetaminophen (Norco)  Amlodipine (Norvasc)  Aspirin  Carvedilol (Coreg)  Cephalexin (Keflex)  Clopidogrel (Plavix)  Furosemide (Lasix)  Helium  Losartan (Cozaar)  Lutein  Nitroglycerin (Nitrostat)  Omega-3 Fatty Acids  Oxygen  Pantoprazole (Protonix)  Polyethylene Glycol (MiraLAX)  Pregabalin (Lyrica)  Semaglutide (Rybelsus)  Tamsulosin (Flomax)  Vitamin C ==================================================================== For clinical consultation, please call [phone]. ====================================================================       ROS  Constitutional: Denies any fever or chills Gastrointestinal: No reported hemesis, hematochezia, vomiting, or acute GI distress Musculoskeletal:  Low back, bilateral leg pain Neurological:  Burning in bilateral feet  Medication Review  DHA-Vitamin C-Lutein, HYDROcodone-acetaminophen, Multiple Vitamins-Minerals, ONE TOUCH ULTRA 2, OneTouch Delica Lancets 09T, Oxygen-Helium, Semaglutide, amLODipine, aspirin EC, carvedilol, clopidogrel, furosemide, glucose blood, losartan, nitroGLYCERIN, pantoprazole, polyethylene glycol, pregabalin, and tamsulosin  History Review  Allergy: [name] is allergic to doxazosin mesylate and methocarbamol. Drug: [name]  reports no history of drug use. Alcohol:  reports no history of alcohol use. Tobacco:  reports that he quit smoking about 57 years ago. His smoking use included cigarettes. He has a 20.00 pack-year smoking history. He has quit using smokeless tobacco. Social: [name]  reports that he quit smoking about 57 years ago. His smoking use included cigarettes. He has a 20.00 pack-year  smoking history. He has quit using smokeless tobacco. He reports that he does not drink alcohol and does not use drugs. Medical:  has a past medical history of Arthritis, CAD (coronary artery disease), Cataract, Chronic diastolic CHF (congestive heart failure) ([place]), Chronic kidney disease, stage III (moderate) ([place]), Chronic lower back pain, Colon polyps, COVID-19, Diverticulosis, Dyspnea (2009 since July -Sept), Enlarged prostate, Esophageal stricture, GERD (gastroesophageal reflux disease), Heart murmur, Hiatal hernia, History of carpal tunnel syndrome, History of kidney stones, History of PFTs, Hyperlipidemia, Hypertension, Interstitial lung disease ([place]), Iron deficiency anemia, Nausea & vomiting, On home oxygen therapy, Osteoporosis, Overweight (BMI 25.0-29.9), Peripheral neuropathy, RA (rheumatoid arthritis) ([place]), Seropositive rheumatoid arthritis ([place]), Type II diabetes mellitus ([place]), Wears glasses, and Wears partial dentures. Surgical: [name]  has a past surgical history that includes Total knee arthroplasty (Right, 03/2010); Knee arthroscopy (Right, 2008); Cataract extraction w/ intraocular lens  implant, bilateral (Bilateral); Coronary artery bypass graft (11/1999); Coronary stent placement (02/2012); Shoulder arthroscopy  with open rotator cuff repair and distal clavicle acrominectomy (Left, 02/27/2013); Trigger finger release (Left, 02/27/2013); Cholecystectomy open (11/2003); Joint replacement; Esophagogastroduodenoscopy (egd) with esophageal dilation (2010); Cardiac catheterization (08/2004); Cardiac catheterization (12/31/2011); Cardiac catheterization (2009); left and right heart catheterization with coronary angiogram (12/31/2011); percutaneous coronary stent intervention (pci-s) (12/31/2011); percutaneous coronary stent intervention (pci-s) (N/A, 01/03/2012); Percutaneous coronary intervention-balloon only (01/03/2012); left and right heart catheterization with coronary angiogram (N/A,  01/14/2014); Hand surgery; Cardiac catheterization (N/A, 08/13/2015); Esophagogastroduodenoscopy (N/A, 03/01/2017); maloney dilation (03/01/2017); Esophagogastroduodenoscopy (egd) with propofol (N/A, 09/12/2018); Botox injection (N/A, 09/12/2018); Balloon dilation (N/A, 09/12/2018); Coronary angioplasty with stent (01/03/2012); Coronary angioplasty with stent (01/14/2014); Esophagogastroduodenoscopy (egd) with propofol (N/A, 11/27/2018); Botox injection (N/A, 11/27/2018); Esophageal dilation (11/27/2018); Esophagogastroduodenoscopy (egd) with propofol (N/A, 02/20/2020); Botox injection (N/A, 02/20/2020); Balloon dilation (N/A, 02/20/2020); and Transurethral resection of bladder tumor with mitomycin-c (N/A, 10/08/2020). Family: family history includes Alcohol abuse in his sister; COPD in his mother; Colon cancer (age of onset: 47) in his brother; Diabetes in his brother; Heart attack in his father; Heart disease in his father; Stomach cancer in his brother; Stroke in his sister.  Laboratory Chemistry Profile   Renal Lab Results  Component Value Date   BUN 23 10/29/2020   CREATININE 2.00 (H) 10/29/2020   BCR 13 06/24/2017   GFR 29.52 (L) 10/29/2020   GFRAA 30 (L) 06/24/2017   GFRNONAA 34 (L) 09/29/2020     Hepatic Lab Results  Component Value Date   AST 13 10/29/2020   ALT 8 10/29/2020   ALBUMIN 3.7 10/29/2020   ALKPHOS 64 10/29/2020     Electrolytes Lab Results  Component Value Date   NA 137 10/29/2020   K 4.9 10/29/2020   CL 98 10/29/2020   CALCIUM 10.1 10/29/2020   MG 2.1 04/12/2020   PHOS 2.9 06/13/2019     Bone No results found for: VD25OH, VD125OH2TOT, EX5284XL2, GM0102VO5, 25OHVITD1, 25OHVITD2, 25OHVITD3, TESTOFREE, TESTOSTERONE   Inflammation (CRP: Acute Phase) (ESR: Chronic Phase) Lab Results  Component Value Date   ESRSEDRATE 38 (H) 11/22/2011       Note: Above Lab results reviewed.  Recent Imaging Review  DG PAIN CLINIC C-ARM 1-60 MIN NO REPORT Fluoro was used, but no  Radiologist interpretation will be provided.  Please refer to "NOTES" tab for provider progress note. Note: Reviewed        Physical Exam  General appearance: Well nourished, well developed, and well hydrated. In no apparent acute distress Mental status: Alert, oriented x 3 (person, place, & time)       Respiratory: No evidence of acute respiratory distress Eyes: PERLA Vitals: BP 135/68 (BP Location: Right Arm, Patient Position: Sitting, Cuff Size: Normal)   Pulse 73   Temp 98 F (36.7 C) (Temporal)   Resp 18   Ht 6' (1.829 m)   Wt 196 lb (88.9 kg)   SpO2 98%   BMI 26.58 kg/m  BMI: Estimated body mass index is 26.58 kg/m as calculated from the following:   Height as of this encounter: 6' (1.829 m).   Weight as of this encounter: 196 lb (88.9 kg). Ideal: Ideal body weight: 77.6 kg (171 lb 1.2 oz) Adjusted ideal body weight: 82.1 kg (181 lb 0.7 oz)   Lumbar Spine Area Exam  Skin & Axial Inspection: No masses, redness, or swelling Alignment: Symmetrical Functional ROM: Decreased ROM , slightly improved after treatment      Stability: No instability detected Muscle Tone/Strength: Functionally intact. No obvious neuro-muscular anomalies detected. Sensory (Neurological): Musculoskeletal  pain pattern, improved after treatment Palpation: No palpable anomalies       Provocative Tests: Hyperextension/rotation test: (+) bilaterally for facet joint pain. Lumbar quadrant test (Kemp's test): (+) bilaterally for facet joint pain.   Gait & Posture Assessment  Ambulation: Unassisted Gait: Relatively normal for age and body habitus Posture: WNL   Lower Extremity Exam      Side: Right lower extremity   Side: Left lower extremity  Stability: No instability observed           Stability: No instability observed          Skin & Extremity Inspection: Skin color, temperature, and hair growth are WNL. No peripheral edema or cyanosis. No masses, redness, swelling, asymmetry, or associated skin  lesions. No contractures.   Skin & Extremity Inspection: Skin color, temperature, and hair growth are WNL. No peripheral edema or cyanosis. No masses, redness, swelling, asymmetry, or associated skin lesions. No contractures.  Functional ROM: Decreased ROM                   Functional ROM: Decreased ROM                  Muscle Tone/Strength: Functionally intact. No obvious neuro-muscular anomalies detected.   Muscle Tone/Strength: Functionally intact. No obvious neuro-muscular anomalies detected.  Sensory (Neurological): Neuropathic pain pattern         Sensory (Neurological): Neuropathic pain pattern        DTR: Patellar: deferred today Achilles: deferred today Plantar: deferred today   DTR: Patellar: deferred today Achilles: deferred today Plantar: deferred today  Palpation: No palpable anomalies   Palpation: No palpable anomalies      Assessment   Status Diagnosis  Persistent Persistent Persistent 1. Lumbar facet arthropathy   2. Lumbar spondylosis   3. Neuropathic pain   4. Diabetic polyneuropathy associated with type 2 diabetes mellitus ([place])   5. Chronic pain of both lower extremities   6. CKD stage 3 due to type 2 diabetes mellitus ([place])   7. Chronic pain syndrome       Plan of Care   [name] has a current medication list which includes the following long-term medication(s): furosemide, pantoprazole, nitroglycerin, and pregabalin.  Pharmacotherapy (Medications Ordered): Meds ordered this encounter  Medications   HYDROcodone-acetaminophen (NORCO) 10-325 MG tablet    Sig: Take 1-2 tablets by mouth 2 (two) times daily as needed for severe pain. Must last 30 days.    Dispense:  120 tablet    Refill:  0    Chronic Pain. (STOP Act - Not applicable). Fill one day early if closed on scheduled refill date.   HYDROcodone-acetaminophen (NORCO) 10-325 MG tablet    Sig: Take 1-2 tablets by mouth 2 (two) times daily as needed for severe pain. Must last 30 days.     Dispense:  120 tablet    Refill:  0    Chronic Pain. (STOP Act - Not applicable). Fill one day early if closed on scheduled refill date.   HYDROcodone-acetaminophen (NORCO) 10-325 MG tablet    Sig: Take 1-2 tablets by mouth 2 (two) times daily as needed for severe pain. Must last 30 days.    Dispense:  120 tablet    Refill:  0    Chronic Pain. (STOP Act - Not applicable). Fill one day early if closed on scheduled refill date.   pregabalin (LYRICA) 50 MG capsule    Sig: Take 1 capsule (50 mg total) by mouth  at bedtime.    Dispense:  90 capsule    Refill:  2    Fill one day early if pharmacy is closed on scheduled refill date. May substitute for generic if available.   Consider capsaicin 8%, Qutenza topical system for neuropathic pain of bilateral feet related to diabetic polyneuropathy.  Patient will contact us after he has completed chemotherapy for bladder cancer to discuss this therapy further.  Follow-up in 3 months for medication management.  Follow-up plan:   Return in about 3 months (around 03/04/2021) for Medication Management, in person.   Recent Visits Date Type Provider Dept  09/29/20 Telemedicine Gillis Santa, MD [facility]  09/17/20 Procedure visit Gillis Santa, MD [facility]  09/09/20 Office Visit Gillis Santa, MD [facility]  Showing recent visits within past 90 days and meeting all other requirements Today's Visits Date Type Provider Dept  12/02/20 Office Visit Gillis Santa, MD [facility]  Showing today's visits and meeting all other requirements Future Appointments No visits were found meeting these conditions. Showing future appointments within next 90 days and meeting all other requirements I discussed the assessment and treatment plan with the patient. The patient was provided an opportunity to ask questions and all were answered. The patient agreed with the plan and demonstrated an understanding of the  instructions.  Patient advised to call back or seek an in-person evaluation if the symptoms or condition worsens.  Duration of encounter: 63mnutes.  Note by: BGillis Santa MD Date: 12/02/2020; Time: 3:26 PM

## 2020-12-02 NOTE — Progress Notes (Signed)
Nursing Pain Medication Assessment:  Safety precautions to be maintained throughout the outpatient stay will include: orient to surroundings, keep bed in low position, maintain call bell within reach at all times, provide assistance with transfer out of bed and ambulation.  Medication Inspection Compliance: Pill count conducted under aseptic conditions, in front of the patient. Neither the pills nor the bottle was removed from the patient's sight at any time. Once count was completed pills were immediately returned to the patient in their original bottle.  Medication: Hydrocodone/APAP Pill/Patch Count:  94 of 120 pills remain Pill/Patch Appearance: Markings consistent with prescribed medication Bottle Appearance: Standard pharmacy container. Clearly labeled. Filled Date: 08 / 08 / 2022 Last Medication intake:  Today

## 2020-12-04 ENCOUNTER — Encounter: Payer: Medicare Other | Admitting: Student in an Organized Health Care Education/Training Program

## 2020-12-09 DIAGNOSIS — Z20822 Contact with and (suspected) exposure to covid-19: Secondary | ICD-10-CM | POA: Diagnosis not present

## 2020-12-09 DIAGNOSIS — Z03818 Encounter for observation for suspected exposure to other biological agents ruled out: Secondary | ICD-10-CM | POA: Diagnosis not present

## 2020-12-12 NOTE — Progress Notes (Deleted)
Office Visit Note  Patient: [name]             Date of Birth: December 16, 1931           MRN: [id number]             PCP: [name], MD Referring: [name], MD Visit Date: 12/25/2020 Occupation: @GUAROCC @  Subjective:  No chief complaint on file.   History of Present Illness: [name] is a 87 y.o. male ***   Activities of Daily Living:  Patient reports morning stiffness for *** {minute/hour:19697}.   Patient {ACTIONS;DENIES/REPORTS:21021675::"Denies"} nocturnal pain.  Difficulty dressing/grooming: {ACTIONS;DENIES/REPORTS:21021675::"Denies"} Difficulty climbing stairs: {ACTIONS;DENIES/REPORTS:21021675::"Denies"} Difficulty getting out of chair: {ACTIONS;DENIES/REPORTS:21021675::"Denies"} Difficulty using hands for taps, buttons, cutlery, and/or writing: {ACTIONS;DENIES/REPORTS:21021675::"Denies"}  No Rheumatology ROS completed.   PMFS History:  Patient Active Problem List   Diagnosis Date Noted   Cystitis 10/29/2020   Weakness 10/29/2020   Bladder mass 08/26/2020   Macular degeneration, wet ([place]) 06/18/2020   Pain due to onychomycosis of toenails of both feet 05/22/2020   Ventricular tachycardia ([place]) 04/23/2020   Chronic narcotic dependence ([place]) 12/14/2019   Plantar flexed metatarsal bone of left foot 11/08/2019   Long term prescription opiate use 12/14/2018   Neuropathy 12/14/2018   Problems with swallowing and mastication    Achalasia    Right inguinal hernia 07/04/2018   Advance directive discussed with patient 04/28/2018   Chronic pain of both lower extremities 10/26/2017   Neuropathic pain 10/26/2017   Chronic pain syndrome 10/26/2017   Iron deficiency anemia 07/05/2017   Constipation 07/05/2017   CKD (chronic kidney disease) stage 4, GFR 15-29 ml/min ([name]) 05/17/2017   Seropositive rheumatoid arthritis ([place]) 05/17/2017   DM (diabetes mellitus) type II controlled, neurological manifestation ([place]) 05/17/2017   Atherosclerotic heart  disease of native coronary artery with angina pectoris ([place]) 05/17/2017   Dysphagia 05/17/2017   High risk medication use 10/21/2016   Primary osteoarthritis of both knees 10/21/2016   History of right knee joint replacement 10/21/2016   Orthostatic hypotension 10/01/2016   Spinal stenosis of lumbar region without neurogenic claudication 03/23/2016   Lumbar facet arthropathy 03/23/2016   Respiratory failure, chronic ([place]) 11/21/2014   Rheumatoid arthritis ([place]) 11/05/2014   Rheumatic fever without heart involvement 03/04/2014   Ventral hernia 12/17/2013   Routine general medical examination at a health care facility 08/29/2012   ILD (interstitial lung disease) ([place]) 11/28/2011   Chronic diastolic heart failure ([place]) 09/14/2011   Nausea 02/16/2011   Diabetic polyneuropathy associated with type 2 diabetes mellitus ([place]) 08/10/2010   Type 2 diabetes mellitus with diabetic polyneuropathy ([place]) 08/10/2010   Obstructive sleep apnea 12/17/2008   ESOPHAGEAL STRICTURE 10/09/2008   Esophageal stricture 10/09/2008   Reflux esophagitis 09/10/2008   Diverticulosis of colon 09/10/2008   Gastro-esophageal reflux disease with esophagitis 09/10/2008   Coronary atherosclerosis 03/19/2008   Aortic valve disorder 03/19/2008   Thoracic aorta atherosclerosis ([place]) 03/19/2008   SLEEP DISORDER, CHRONIC 10/17/2006   Disturbance in sleep behavior 10/17/2006   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   Essential hypertension 09/23/2006   GERD 09/23/2006   BENIGN PROSTATIC HYPERTROPHY 09/23/2006   Enlarged prostate without lower urinary tract symptoms (luts) 09/23/2006   HLD (hyperlipidemia) 09/23/2006    Past Medical History:  Diagnosis Date   Arthritis    osteoarthritis, s/p R TKR, and digits   CAD (coronary artery disease)    a. s/p CABG (2001)  b. s/p DES to RCA and cutting POBA to ostial  PDA (2013)   c. s/p DES to SVG to OM2 (01/14/14) d. cath: 08/2015 NSTEMI w/ patent LIMA-LAD  and 99% stenosis of SVG-OM w/ DES placed. CTO of SVG-RCA and SVG-D1.    Cataract    Chronic diastolic CHF (congestive heart failure) ([place])    a) 09/13 ECHO- LVEF 40-10%, grade 1 diastolic dysfunction, mild LA dilatation, atrial septal aneurysm, AV mobility restricted, but no sig AS by doppler; b) 09/04/08 ECHO- LVH, ef 60%, mild AS, c. echo 08/2015: EF perserved of 55-60% with inferolateral HK. Mild AS noted.   Chronic kidney disease, stage III (moderate) ([name])    Chronic lower back pain    Colon polyps    COVID-19    Diverticulosis    Dyspnea 2009 since July -Sept   05/06/08-CPST-  normal effort, reduced VO2 max 20.5 /65%, reduced at 8.2/ 40%, normal breathing resetvca of 55%, submaximal heart rate response 112/77%, flattened o2 pluse response at peak exercise-12 ml/beat @ 85%, No VQ mismatch abnormalities, All c/w CIRC Limitation   Enlarged prostate    Esophageal stricture    a. s/p dilation spring 2010   GERD (gastroesophageal reflux disease)    Heart murmur    Hiatal hernia    History of carpal tunnel syndrome    Bilateral   History of kidney stones    History of PFTs    mixed pattern on spiro. mild restn on lung volumes with near normal DLCO. Pattern can be explained by CABG scar. Fev1 2.2L/73%, ratio 68 (67), TLC 4.7/68%,RV 1.5L/55%,DLCO 79%   Hyperlipidemia    Hypertension    Interstitial lung disease ([name])    NOS   Iron deficiency anemia    Nausea & vomiting    2018/2019   On home oxygen therapy    2 L [place] at bedtime   Osteoporosis    Overweight (BMI 25.0-29.9)    BMI 29   Peripheral neuropathy    RA (rheumatoid arthritis) ([name])    [name]   Seropositive rheumatoid arthritis ([place])    Type II diabetes mellitus ([place])    diet controlled   Wears glasses    Wears partial dentures    upper    Family History  Problem Relation Age of Onset   COPD Mother    Heart disease Father    Heart attack Father    Stomach cancer Brother    Stroke Sister    Alcohol abuse Sister     Colon cancer Brother 25   Diabetes Brother    Rectal cancer Neg Hx    Past Surgical History:  Procedure Laterality Date   BALLOON DILATION N/A 09/12/2018   Procedure: BALLOON DILATION;  Surgeon: Irene Shipper, MD;  Location: [facility];  Service: Endoscopy;  Laterality: N/A;   BALLOON DILATION N/A 02/20/2020   Procedure: BALLOON DILATION;  Surgeon: Irene Shipper, MD;  Location: [facility];  Service: Endoscopy;  Laterality: N/A;   BOTOX INJECTION N/A 09/12/2018   Procedure: BOTOX INJECTION;  Surgeon: Irene Shipper, MD;  [facility]: [facility];  Service: Endoscopy;  Laterality: N/A;   BOTOX INJECTION N/A 11/27/2018   Procedure: BOTOX INJECTION;  Surgeon: Irene Shipper, MD;  [facility]: [facility];  Service: Endoscopy;  Laterality: N/A;   BOTOX INJECTION N/A 02/20/2020   Procedure: BOTOX INJECTION;  Surgeon: Irene Shipper, MD;  [facility];  Service: Endoscopy;  Laterality: N/A;   CARDIAC CATHETERIZATION  08/2004   CP- no MI, Cath- small vessell disease  CARDIAC CATHETERIZATION  12/31/2011   80% distal LM, 100% native LAD, LCx and RCA, 30% prox SVG-OM, SVG-D1 normal, 99% distal, 80% ostial SVG-RCA distal to graft, LIMA-LAD normal; LVEF mildly decreased with posterior basal AK    CARDIAC CATHETERIZATION  2009   with patent grafts/notes 12/31/2011   CARDIAC CATHETERIZATION N/A 08/13/2015   Procedure: Left Heart Cath and Cors/Grafts Angiography;  Surgeon: Sherren Mocha, MD;  Location: [facility];  Service: Cardiovascular;  Laterality: N/A;   CATARACT EXTRACTION W/ INTRAOCULAR LENS  IMPLANT, BILATERAL Bilateral    CHOLECYSTECTOMY OPEN  11/2003   [name]   CORONARY ANGIOPLASTY WITH STENT PLACEMENT  01/03/2012   Successful DES to SVG-RCA and cutting balloon angioplasty ostial  PDA    CORONARY ANGIOPLASTY WITH STENT PLACEMENT  01/14/2014   "1"   CORONARY ARTERY BYPASS GRAFT  11/1999   CABG X5   CORONARY STENT PLACEMENT  02/2012   1 stent and balloon   ESOPHAGEAL DILATION   11/27/2018   Procedure: ESOPHAGEAL DILATION;  Surgeon: Irene Shipper, MD;  [facility];  Service: Endoscopy;;   ESOPHAGOGASTRODUODENOSCOPY N/A 03/01/2017   Procedure: ESOPHAGOGASTRODUODENOSCOPY (EGD);  Surgeon: Irene Shipper, MD;  Location[facility];  Service: Endoscopy;  Laterality: N/A;   ESOPHAGOGASTRODUODENOSCOPY (EGD) WITH ESOPHAGEAL DILATION  2010   ESOPHAGOGASTRODUODENOSCOPY (EGD) WITH PROPOFOL N/A 09/12/2018   Procedure: ESOPHAGOGASTRODUODENOSCOPY (EGD) WITH PROPOFOL;  Surgeon: Irene Shipper, MD;  [facility];  Service: Endoscopy;  Laterality: N/A;   ESOPHAGOGASTRODUODENOSCOPY (EGD) WITH PROPOFOL N/A 11/27/2018   Procedure: ESOPHAGOGASTRODUODENOSCOPY (EGD) WITH PROPOFOL, WITH BALLOON DILATION;  Surgeon: Irene Shipper, MD;  [facility];  Service: Endoscopy;  Laterality: N/A;   ESOPHAGOGASTRODUODENOSCOPY (EGD) WITH PROPOFOL N/A 02/20/2020   Procedure: ESOPHAGOGASTRODUODENOSCOPY (EGD) WITH PROPOFOL;  Surgeon: Irene Shipper, MD;  [facility];  Service: Endoscopy;  Laterality: N/A;   HAND SURGERY     bilateral carpal tunnel releases   JOINT REPLACEMENT     KNEE ARTHROSCOPY Right 2008   LEFT AND RIGHT HEART CATHETERIZATION WITH CORONARY ANGIOGRAM  12/31/2011   Procedure: LEFT AND RIGHT HEART CATHETERIZATION WITH CORONARY ANGIOGRAM;  Surgeon: Burnell Blanks, MD;  Location: [facility] CATH LAB;  Service: Cardiovascular;;   LEFT AND RIGHT HEART CATHETERIZATION WITH CORONARY ANGIOGRAM N/A 01/14/2014   Procedure: LEFT AND RIGHT HEART CATHETERIZATION WITH CORONARY ANGIOGRAM;  Surgeon: Peter M Martinique, MD;  Location[facility];  Service: Cardiovascular;  Laterality: N/A;   MALONEY DILATION  03/01/2017   Procedure: Venia Minks DILATION;  Surgeon: Irene Shipper, MD;  [facility]: [facility];  Service: Endoscopy;;   PERCUTANEOUS CORONARY INTERVENTION-BALLOON ONLY  01/03/2012   Procedure: PERCUTANEOUS CORONARY INTERVENTION-BALLOON ONLY;  Surgeon: Peter M Martinique, MD;   Location[facility] CATH LAB;  Service: Cardiovascular;;   PERCUTANEOUS CORONARY STENT INTERVENTION (PCI-S)  12/31/2011   Procedure: PERCUTANEOUS CORONARY STENT INTERVENTION (PCI-S);  Surgeon: Burnell Blanks, MD;  Location: [facility] CATH LAB;  Service: Cardiovascular;;   PERCUTANEOUS CORONARY STENT INTERVENTION (PCI-S) N/A 01/03/2012   Procedure: PERCUTANEOUS CORONARY STENT INTERVENTION (PCI-S);  Surgeon: Peter M Martinique, MD;  Location: [facility] CATH LAB;  Service: Cardiovascular;  Laterality: N/A;   SHOULDER ARTHROSCOPY WITH OPEN ROTATOR CUFF REPAIR AND DISTAL CLAVICLE ACROMINECTOMY Left 02/27/2013   Procedure: LEFT SHOULDER ARTHROSCOPY WITH MINI OPEN ROTATOR CUFF REPAIR AND SUBACROMIAL DECOMPRESSION AND DISTAL CLAVICLE RESECTION;  Surgeon: [name], MD;  Location: [place];  Service: Orthopedics;  Laterality: Left;   TOTAL KNEE ARTHROPLASTY Right 03/2010   [name]  TRANSURETHRAL RESECTION OF BLADDER TUMOR WITH MITOMYCIN-C N/A 10/08/2020   Procedure: TRANSURETHRAL RESECTION OF BLADDER TUMOR WITH GEMCITABINE;  Surgeon: Robley Fries, MD;  Location: [facility];  Service: Urology;  Laterality: N/A;  75 MINS   TRIGGER FINGER RELEASE Left 02/27/2013   Procedure: RELEASE TRIGGER FINGER/A-1 PULLEY;  Surgeon: [name], MD;  Location: [facility];  Service: Orthopedics;  Laterality: Left;   Social History   Social History Narrative   No living will   Requests wife as health care POA-- alternate is daughter Hassan Rowan   Discussed DNR --he requests this (done 08/29/12)   Not sure about feeding tube---but might accept for some time   Patient lives with wife and daughter in a one story home.  Has 3 children.  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   Immunization History  Administered Date(s) Administered   H1N1 03/27/2008   Influenza Split 12/29/2010, 01/18/2012   Influenza Whole 02/03/2007, 01/09/2008, 01/01/2009, 12/31/2009   Influenza,inj,Quad PF,6+ Mos 12/13/2012, 01/15/2014,  01/15/2015, 01/06/2016, 01/03/2017, 02/09/2018, 01/30/2019, 01/05/2020   PFIZER(Purple Top)SARS-COV-2 Vaccination 04/26/2019, 05/17/2019   Pneumococcal Conjugate-13 09/10/2013   Pneumococcal Polysaccharide-23 03/05/1997, 08/17/2011   Td 03/05/1997, 08/17/2011     Objective: Vital Signs: There were no vitals taken for this visit.   Physical Exam   Musculoskeletal Exam: ***  CDAI Exam: CDAI Score: -- Patient Global: --; Provider Global: -- Swollen: --; Tender: -- Joint Exam 12/25/2020   No joint exam has been documented for this visit   There is currently no information documented on the homunculus. Go to the Rheumatology activity and complete the homunculus joint exam.  Investigation: No additional findings.  Imaging: No results found.  Recent Labs: Lab Results  Component Value Date   WBC 9.3 10/29/2020   HGB 12.2 (L) 10/29/2020   PLT 347.0 10/29/2020   NA 137 10/29/2020   K 4.9 10/29/2020   CL 98 10/29/2020   CO2 30 10/29/2020   GLUCOSE 111 (H) 10/29/2020   BUN 23 10/29/2020   CREATININE 2.00 (H) 10/29/2020   BILITOT 0.4 10/29/2020   ALKPHOS 64 10/29/2020   AST 13 10/29/2020   ALT 8 10/29/2020   PROT 7.7 10/29/2020   ALBUMIN 3.7 10/29/2020   CALCIUM 10.1 10/29/2020   GFRAA 30 (L) 06/24/2017    Speciality Comments: D/c Imuran and Cellcept in the past due to GI SE  Procedures:  No procedures performed Allergies: Doxazosin mesylate and Methocarbamol   Assessment / Plan:     Visit Diagnoses: No diagnosis found.  Orders: No orders of the defined types were placed in this encounter.  No orders of the defined types were placed in this encounter.   Face-to-face time spent with patient was *** minutes. Greater than 50% of time was spent in counseling and coordination of care.  Follow-Up Instructions: No follow-ups on file.   Earnestine Mealing, CMA  Note - This record has been created using Editor, commissioning.  Chart creation errors have been sought, but  may not always  have been located. Such creation errors do not reflect on  the standard of medical care.

## 2020-12-16 ENCOUNTER — Telehealth: Payer: Self-pay | Admitting: Internal Medicine

## 2020-12-16 DIAGNOSIS — R0689 Other abnormalities of breathing: Secondary | ICD-10-CM | POA: Diagnosis not present

## 2020-12-16 DIAGNOSIS — J8410 Pulmonary fibrosis, unspecified: Secondary | ICD-10-CM | POA: Diagnosis not present

## 2020-12-16 NOTE — Telephone Encounter (Signed)
Pt called stating that he filled out paper work about 2 weeks ago regarding help on his medication, would like for you to give him a call

## 2020-12-19 NOTE — Telephone Encounter (Signed)
Left a message for pt. Advised him they stated there was a patient signature missing. The paperwork has not been scanned in so I cannot print it out right now.

## 2020-12-25 ENCOUNTER — Ambulatory Visit: Payer: Medicare Other | Admitting: Rheumatology

## 2020-12-25 DIAGNOSIS — M79601 Pain in right arm: Secondary | ICD-10-CM

## 2020-12-25 DIAGNOSIS — J849 Interstitial pulmonary disease, unspecified: Secondary | ICD-10-CM

## 2020-12-25 DIAGNOSIS — Z8669 Personal history of other diseases of the nervous system and sense organs: Secondary | ICD-10-CM

## 2020-12-25 DIAGNOSIS — M1712 Unilateral primary osteoarthritis, left knee: Secondary | ICD-10-CM

## 2020-12-25 DIAGNOSIS — M47816 Spondylosis without myelopathy or radiculopathy, lumbar region: Secondary | ICD-10-CM

## 2020-12-25 DIAGNOSIS — Z79899 Other long term (current) drug therapy: Secondary | ICD-10-CM

## 2020-12-25 DIAGNOSIS — Z8679 Personal history of other diseases of the circulatory system: Secondary | ICD-10-CM

## 2020-12-25 DIAGNOSIS — I5032 Chronic diastolic (congestive) heart failure: Secondary | ICD-10-CM

## 2020-12-25 DIAGNOSIS — M059 Rheumatoid arthritis with rheumatoid factor, unspecified: Secondary | ICD-10-CM

## 2020-12-25 DIAGNOSIS — N184 Chronic kidney disease, stage 4 (severe): Secondary | ICD-10-CM

## 2020-12-25 DIAGNOSIS — Z8719 Personal history of other diseases of the digestive system: Secondary | ICD-10-CM

## 2020-12-25 DIAGNOSIS — Z96651 Presence of right artificial knee joint: Secondary | ICD-10-CM

## 2020-12-25 DIAGNOSIS — M25511 Pain in right shoulder: Secondary | ICD-10-CM

## 2020-12-25 DIAGNOSIS — Z8639 Personal history of other endocrine, nutritional and metabolic disease: Secondary | ICD-10-CM

## 2020-12-26 ENCOUNTER — Other Ambulatory Visit: Payer: Self-pay

## 2020-12-26 ENCOUNTER — Ambulatory Visit (INDEPENDENT_AMBULATORY_CARE_PROVIDER_SITE_OTHER): Payer: Medicare Other | Admitting: Internal Medicine

## 2020-12-26 ENCOUNTER — Encounter: Payer: Self-pay | Admitting: Internal Medicine

## 2020-12-26 VITALS — BP 124/62 | HR 72 | Temp 98.6°F | Ht 72.0 in | Wt 189.0 lb

## 2020-12-26 DIAGNOSIS — N184 Chronic kidney disease, stage 4 (severe): Secondary | ICD-10-CM | POA: Diagnosis not present

## 2020-12-26 DIAGNOSIS — I25119 Atherosclerotic heart disease of native coronary artery with unspecified angina pectoris: Secondary | ICD-10-CM | POA: Diagnosis not present

## 2020-12-26 DIAGNOSIS — Z23 Encounter for immunization: Secondary | ICD-10-CM | POA: Diagnosis not present

## 2020-12-26 DIAGNOSIS — J849 Interstitial pulmonary disease, unspecified: Secondary | ICD-10-CM | POA: Diagnosis not present

## 2020-12-26 DIAGNOSIS — I5032 Chronic diastolic (congestive) heart failure: Secondary | ICD-10-CM | POA: Diagnosis not present

## 2020-12-26 DIAGNOSIS — E1142 Type 2 diabetes mellitus with diabetic polyneuropathy: Secondary | ICD-10-CM

## 2020-12-26 LAB — POCT GLYCOSYLATED HEMOGLOBIN (HGB A1C): Hemoglobin A1C: 6.9 % — AB (ref 4.0–5.6)

## 2020-12-26 LAB — HM DIABETES FOOT EXAM

## 2020-12-26 NOTE — Assessment & Plan Note (Signed)
DOE may be anginal equivalent----has nitro for prn use Plavix, coreg

## 2020-12-26 NOTE — Assessment & Plan Note (Signed)
Stable GFR Continues with the nephrologist

## 2020-12-26 NOTE — Assessment & Plan Note (Signed)
Lab Results  Component Value Date   HGBA1C 6.9 (A) 12/26/2020   Better even without the rybelsus No other Rx at this point

## 2020-12-26 NOTE — Assessment & Plan Note (Signed)
Compensated Uses the furosemide 3 days per week

## 2020-12-26 NOTE — Progress Notes (Signed)
Subjective:    Patient ID: [name], male    DOB: 13-Nov-1931, 87 y.o.   MRN: [id number]  HPI Here for follow up of diabetes and other chronic medical conditions With wife This visit occurred during the SARS-CoV-2 public health emergency.  Safety protocols were in place, including screening questions prior to the visit, additional usage of staff PPE, and extensive cleaning of exam room while observing appropriate contact time as indicated for disinfecting solutions.   Recent COVID infection Feeling better now--never really had any severe symptoms No cough or change in chronic SOB  Sugar 138 today---this is typical Currently not taking the rybelsus---needs patient assistance  Did have bladder cancer Has 2 more scheduled treatments scheduled  Continues with Dr [name] Pain is variable---severe foot pain is intermittent  No chest pain Same SOB Some dizziness but no syncope No falls No edema  Recent labs---GFR 29  Current Outpatient Medications on File Prior to Visit  Medication Sig Dispense Refill   amLODipine (NORVASC) 5 MG tablet Take 5 mg by mouth at bedtime.     aspirin EC 81 MG tablet Take 81 mg by mouth at bedtime.     Blood Glucose Monitoring Suppl (ONE TOUCH ULTRA 2) w/Device KIT Use to obtain blood sugar daily. Dx Code E11.40 1 kit 0   carvedilol (COREG) 6.25 MG tablet Take 6.25 mg by mouth 2 (two) times daily.     clopidogrel (PLAVIX) 75 MG tablet Take 1 tablet (75 mg total) by mouth daily. (Patient taking differently: Take 75 mg by mouth at bedtime.) 90 tablet 3   DHA-Vitamin C-Lutein (EYE HEALTH FORMULA PO) Take 1 tablet by mouth daily.      furosemide (LASIX) 40 MG tablet Take 1 tablet (40 mg total) by mouth every Monday, Wednesday, and Friday. 12 tablet 0   glucose blood (ONE TOUCH ULTRA TEST) test strip USE TO CHECK BLOOD SUGAR ONCE A DAY Dx Code E11.40 100 each 3   HYDROcodone-acetaminophen (NORCO) 10-325 MG tablet Take 1-2 tablets by mouth 2 (two) times  daily as needed for severe pain. Must last 30 days. 120 tablet 0   [START ON 01/09/2021] HYDROcodone-acetaminophen (NORCO) 10-325 MG tablet Take 1-2 tablets by mouth 2 (two) times daily as needed for severe pain. Must last 30 days. 120 tablet 0   [START ON 02/08/2021] HYDROcodone-acetaminophen (NORCO) 10-325 MG tablet Take 1-2 tablets by mouth 2 (two) times daily as needed for severe pain. Must last 30 days. 120 tablet 0   losartan (COZAAR) 25 MG tablet Take 25 mg by mouth every evening.     nitroGLYCERIN (NITROSTAT) 0.4 MG SL tablet DISSOLVE 1 TABLET UNDER TONGUE AS NEEDEDFOR CHEST PAIN. MAY REPEAT 5 MINUTES APART 3 TIMES IF NEEDED. IF NO RELIEF CALL 911 25 tablet 0   OneTouch Delica Lancets 89F MISC 1 each by In Vitro route daily. Dx Code E11.49 100 each 3   OXYGEN Inhale into the lungs. Per pt- Uses Oxygen 2 liters at night.     pantoprazole (PROTONIX) 40 MG tablet TAKE 1 TABLET BY MOUTH TWICE (2) DAILY 180 tablet 3   polyethylene glycol (MIRALAX / GLYCOLAX) 17 g packet Take 51 g by mouth every other day.      pregabalin (LYRICA) 50 MG capsule Take 1 capsule (50 mg total) by mouth at bedtime. 90 capsule 2   tamsulosin (FLOMAX) 0.4 MG CAPS capsule Take 0.4 mg by mouth at bedtime. Takes Monday, Wednesday, Friday     Multiple Vitamins-Minerals (EYE MULTIVITAMIN  PO) Take 1 tablet by mouth daily. (Patient not taking: Reported on 12/26/2020)     Semaglutide (RYBELSUS) 3 MG TABS Take 1 tablet by mouth daily. (Patient not taking: Reported on 12/26/2020) 30 tablet 11   No current facility-administered medications on file prior to visit.    Allergies  Allergen Reactions   Doxazosin Mesylate Other (See Comments)    dizziness   Methocarbamol Rash    Past Medical History:  Diagnosis Date   Arthritis    osteoarthritis, s/p R TKR, and digits   CAD (coronary artery disease)    a. s/p CABG (2001)  b. s/p DES to RCA and cutting POBA to ostial PDA (2013)   c. s/p DES to SVG to OM2 (01/14/14) d. cath:  08/2015 NSTEMI w/ patent LIMA-LAD and 99% stenosis of SVG-OM w/ DES placed. CTO of SVG-RCA and SVG-D1.    Cataract    Chronic diastolic CHF (congestive heart failure) ([place])    a) 09/13 ECHO- LVEF 20-10%, grade 1 diastolic dysfunction, mild LA dilatation, atrial septal aneurysm, AV mobility restricted, but no sig AS by doppler; b) 09/04/08 ECHO- LVH, ef 60%, mild AS, c. echo 08/2015: EF perserved of 55-60% with inferolateral HK. Mild AS noted.   Chronic kidney disease, stage III (moderate) ([name])    Chronic lower back pain    Colon polyps    COVID-19    Diverticulosis    Dyspnea 2009 since July -Sept   05/06/08-CPST-  normal effort, reduced VO2 max 20.5 /65%, reduced at 8.2/ 40%, normal breathing resetvca of 55%, submaximal heart rate response 112/77%, flattened o2 pluse response at peak exercise-12 ml/beat @ 85%, No VQ mismatch abnormalities, All c/w CIRC Limitation   Enlarged prostate    Esophageal stricture    a. s/p dilation spring 2010   GERD (gastroesophageal reflux disease)    Heart murmur    Hiatal hernia    History of carpal tunnel syndrome    Bilateral   History of kidney stones    History of PFTs    mixed pattern on spiro. mild restn on lung volumes with near normal DLCO. Pattern can be explained by CABG scar. Fev1 2.2L/73%, ratio 68 (67), TLC 4.7/68%,RV 1.5L/55%,DLCO 79%   Hyperlipidemia    Hypertension    Interstitial lung disease ([name])    NOS   Iron deficiency anemia    Nausea & vomiting    2018/2019   On home oxygen therapy    2 L [place] at bedtime   Osteoporosis    Overweight (BMI 25.0-29.9)    BMI 29   Peripheral neuropathy    RA (rheumatoid arthritis) ([place])    [name]   Seropositive rheumatoid arthritis ([place])    Type II diabetes mellitus ([place])    diet controlled   Wears glasses    Wears partial dentures    upper    Past Surgical History:  Procedure Laterality Date   BALLOON DILATION N/A 09/12/2018   Procedure: BALLOON DILATION;  Surgeon: Irene Shipper, MD;   Location: [facility];  Service: Endoscopy;  Laterality: N/A;   BALLOON DILATION N/A 02/20/2020   Procedure: BALLOON DILATION;  Surgeon: Irene Shipper, MD;  Location: [facility];  Service: Endoscopy;  Laterality: N/A;   BOTOX INJECTION N/A 09/12/2018   Procedure: BOTOX INJECTION;  Surgeon: Irene Shipper, MD;  Location: [facility];  Service: Endoscopy;  Laterality: N/A;   BOTOX INJECTION N/A 11/27/2018   Procedure: BOTOX INJECTION;  Surgeon: Irene Shipper, MD;  Location:  [facility];  Service: Endoscopy;  Laterality: N/A;   BOTOX INJECTION N/A 02/20/2020   Procedure: BOTOX INJECTION;  Surgeon: Irene Shipper, MD;  Location: [facility];  Service: Endoscopy;  Laterality: N/A;   CARDIAC CATHETERIZATION  08/2004   CP- no MI, Cath- small vessell disease    CARDIAC CATHETERIZATION  12/31/2011   80% distal LM, 100% native LAD, LCx and RCA, 30% prox SVG-OM, SVG-D1 normal, 99% distal, 80% ostial SVG-RCA distal to graft, LIMA-LAD normal; LVEF mildly decreased with posterior basal AK    CARDIAC CATHETERIZATION  2009   with patent grafts/notes 12/31/2011   CARDIAC CATHETERIZATION N/A 08/13/2015   Procedure: Left Heart Cath and Cors/Grafts Angiography;  Surgeon: Sherren Mocha, MD;  Location: [facility];  Service: Cardiovascular;  Laterality: N/A;   CATARACT EXTRACTION W/ INTRAOCULAR LENS  IMPLANT, BILATERAL Bilateral    CHOLECYSTECTOMY OPEN  11/2003   [name]   CORONARY ANGIOPLASTY WITH STENT PLACEMENT  01/03/2012   Successful DES to SVG-RCA and cutting balloon angioplasty ostial  PDA    CORONARY ANGIOPLASTY WITH STENT PLACEMENT  01/14/2014   "1"   CORONARY ARTERY BYPASS GRAFT  11/1999   CABG X5   CORONARY STENT PLACEMENT  02/2012   1 stent and balloon   ESOPHAGEAL DILATION  11/27/2018   Procedure: ESOPHAGEAL DILATION;  Surgeon: Irene Shipper, MD;  Location: [facility];  Service: Endoscopy;;   ESOPHAGOGASTRODUODENOSCOPY N/A 03/01/2017   Procedure: ESOPHAGOGASTRODUODENOSCOPY (EGD);  Surgeon:  Irene Shipper, MD;  Location: [facility];  Service: Endoscopy;  Laterality: N/A;   ESOPHAGOGASTRODUODENOSCOPY (EGD) WITH ESOPHAGEAL DILATION  2010   ESOPHAGOGASTRODUODENOSCOPY (EGD) WITH PROPOFOL N/A 09/12/2018   Procedure: ESOPHAGOGASTRODUODENOSCOPY (EGD) WITH PROPOFOL;  Surgeon: Irene Shipper, MD;  [facility]: [facility];  Service: Endoscopy;  Laterality: N/A;   ESOPHAGOGASTRODUODENOSCOPY (EGD) WITH PROPOFOL N/A 11/27/2018   Procedure: ESOPHAGOGASTRODUODENOSCOPY (EGD) WITH PROPOFOL, WITH BALLOON DILATION;  Surgeon: Irene Shipper, MD;  Location: [facility];  Service: Endoscopy;  Laterality: N/A;   ESOPHAGOGASTRODUODENOSCOPY (EGD) WITH PROPOFOL N/A 02/20/2020   Procedure: ESOPHAGOGASTRODUODENOSCOPY (EGD) WITH PROPOFOL;  Surgeon: Irene Shipper, MD;  Location: [facility];  Service: Endoscopy;  Laterality: N/A;   HAND SURGERY     bilateral carpal tunnel releases   JOINT REPLACEMENT     KNEE ARTHROSCOPY Right 2008   LEFT AND RIGHT HEART CATHETERIZATION WITH CORONARY ANGIOGRAM  12/31/2011   Procedure: LEFT AND RIGHT HEART CATHETERIZATION WITH CORONARY ANGIOGRAM;  Surgeon: Burnell Blanks, MD;  Location: [facility] CATH LAB;  Service: Cardiovascular;;   LEFT AND RIGHT HEART CATHETERIZATION WITH CORONARY ANGIOGRAM N/A 01/14/2014   Procedure: LEFT AND RIGHT HEART CATHETERIZATION WITH CORONARY ANGIOGRAM;  Surgeon: Peter M Martinique, MD;  Location: [facility] CATH LAB;  Service: Cardiovascular;  Laterality: N/A;   MALONEY DILATION  03/01/2017   Procedure: Venia Minks DILATION;  Surgeon: Irene Shipper, MD;  Location: [facility];  Service: Endoscopy;;   PERCUTANEOUS CORONARY INTERVENTION-BALLOON ONLY  01/03/2012   Procedure: PERCUTANEOUS CORONARY INTERVENTION-BALLOON ONLY;  Surgeon: Peter M Martinique, MD;  Location: [facility] CATH LAB;  Service: Cardiovascular;;   PERCUTANEOUS CORONARY STENT INTERVENTION (PCI-S)  12/31/2011   Procedure: PERCUTANEOUS CORONARY STENT INTERVENTION (PCI-S);  Surgeon: Burnell Blanks, MD;   Location: [facility] CATH LAB;  Service: Cardiovascular;;   PERCUTANEOUS CORONARY STENT INTERVENTION (PCI-S) N/A 01/03/2012   Procedure: PERCUTANEOUS CORONARY STENT INTERVENTION (PCI-S);  Surgeon: Peter M Martinique, MD;  Location: [facility] CATH LAB;  Service: Cardiovascular;  Laterality: N/A;   SHOULDER ARTHROSCOPY WITH OPEN ROTATOR CUFF  REPAIR AND DISTAL CLAVICLE ACROMINECTOMY Left 02/27/2013   Procedure: LEFT SHOULDER ARTHROSCOPY WITH MINI OPEN ROTATOR CUFF REPAIR AND SUBACROMIAL DECOMPRESSION AND DISTAL CLAVICLE RESECTION;  Surgeon: Valeria Batman, MD;  Location: [facility] OR;  Service: Orthopedics;  Laterality: Left;   TOTAL KNEE ARTHROPLASTY Right 03/2010   Dr Brynda Greathouse   TRANSURETHRAL RESECTION OF BLADDER TUMOR WITH MITOMYCIN-C N/A 10/08/2020   Procedure: TRANSURETHRAL RESECTION OF BLADDER TUMOR WITH GEMCITABINE;  Surgeon: Noel Christmas, MD;  Location: [facility];  Service: Urology;  Laterality: N/A;  75 MINS   TRIGGER FINGER RELEASE Left 02/27/2013   Procedure: RELEASE TRIGGER FINGER/A-1 PULLEY;  Surgeon: Valeria Batman, MD;  Location: [facility] OR;  Service: Orthopedics;  Laterality: Left;    Family History  Problem Relation Age of Onset   COPD Mother    Heart disease Father    Heart attack Father    Stomach cancer Brother    Stroke Sister    Alcohol abuse Sister    Colon cancer Brother 25   Diabetes Brother    Rectal cancer Neg Hx     Social History   Socioeconomic History   Marital status: Married    Spouse name: Not on file   Number of children: 3   Years of education: Not on file   Highest education level: Not on file  Occupational History   Occupation: Personnel officer: RETIRED    Comment: retired  Tobacco Use   Smoking status: Former    Packs/day: 1.00    Years: 20.00    Pack years: 20.00    Types: Cigarettes    Quit date: 04/06/1963    Years since quitting: 57.7   Smokeless tobacco: Former  Building services engineer Use: Never used  Substance and Sexual Activity   Alcohol use: No     Alcohol/week: 0.0 standard drinks    Comment: 01/01/2012 "last alcohol ~ 50 yr ago"   Drug use: No   Sexual activity: Not Currently  Other Topics Concern   Not on file  Social History Narrative   No living will   Requests wife as health care POA-- alternate is daughter Steward Drone   Discussed DNR --he requests this (done 08/29/12)   Not sure about feeding tube---but might accept for some time   Patient lives with wife and daughter in a one story home.  Has 3 children.  Retired from working in Therapist, music care. Education: 9th grade.   Social Determinants of Health   Financial Resource Strain: Low Risk    Difficulty of Paying Living Expenses: Not very hard  Food Insecurity: Not on file  Transportation Needs: Not on file  Physical Activity: Not on file  Stress: Not on file  Social Connections: Not on file  Intimate Partner Violence: Not on file   Review of Systems Appetite is improving since COVID Weight down a few pounds     Objective:   Physical Exam Constitutional:      Appearance: Normal appearance.  Cardiovascular:     Rate and Rhythm: Normal rate and regular rhythm.     Pulses: Normal pulses.     Heart sounds: No murmur heard.   No gallop.  Pulmonary:     Effort: Pulmonary effort is normal.     Breath sounds: No wheezing.     Comments: Decreased breath sounds No sig crackles today Musculoskeletal:     Cervical back: Neck supple.     Right lower leg: No edema.  Left lower leg: No edema.  Lymphadenopathy:     Cervical: No cervical adenopathy.  Skin:    Findings: No rash.     Comments: No foot lesions  Neurological:     Mental Status: He is alert.     Comments: Significantly decreased sensation in feet  Psychiatric:        Mood and Affect: Mood normal.        Behavior: Behavior normal.           Assessment & Plan:

## 2020-12-26 NOTE — Assessment & Plan Note (Signed)
Stable DOE

## 2020-12-26 NOTE — Telephone Encounter (Signed)
Patient came in office today for visit with Dr. Silvio Pate. I have printed out the ppw and had fill in missing signature. Will fax today and send updated copy to scan

## 2020-12-29 ENCOUNTER — Ambulatory Visit: Payer: Medicare Other | Admitting: Podiatry

## 2020-12-29 DIAGNOSIS — Z5111 Encounter for antineoplastic chemotherapy: Secondary | ICD-10-CM | POA: Diagnosis not present

## 2020-12-29 DIAGNOSIS — C674 Malignant neoplasm of posterior wall of bladder: Secondary | ICD-10-CM | POA: Diagnosis not present

## 2021-01-05 DIAGNOSIS — R8271 Bacteriuria: Secondary | ICD-10-CM | POA: Diagnosis not present

## 2021-01-05 DIAGNOSIS — C674 Malignant neoplasm of posterior wall of bladder: Secondary | ICD-10-CM | POA: Diagnosis not present

## 2021-01-08 ENCOUNTER — Telehealth: Payer: Self-pay

## 2021-01-08 DIAGNOSIS — D3131 Benign neoplasm of right choroid: Secondary | ICD-10-CM | POA: Diagnosis not present

## 2021-01-08 DIAGNOSIS — H353221 Exudative age-related macular degeneration, left eye, with active choroidal neovascularization: Secondary | ICD-10-CM | POA: Diagnosis not present

## 2021-01-08 DIAGNOSIS — H353213 Exudative age-related macular degeneration, right eye, with inactive scar: Secondary | ICD-10-CM | POA: Diagnosis not present

## 2021-01-08 DIAGNOSIS — H35372 Puckering of macula, left eye: Secondary | ICD-10-CM | POA: Diagnosis not present

## 2021-01-08 NOTE — Progress Notes (Addendum)
  Chronic Care Management Pharmacy Assistant   Name: [name]  MRN: [id number] DOB: 12/26/1931  Reason for Encounter: Diabetes Disease State   Recent office visits:  12/26/2020 - Richard [name], MD - Patient presented for follow up for Diabetes. Lab: A1c. No medication changes.   Recent consult visits:  01/02/2021 - Bilal Lateef, MD - Patient presented for foot pain. No medication changes.   Hospital visits:  None since last CCM contact  Medications: Outpatient Encounter Medications as of 01/08/2021  Medication Sig   amLODipine (NORVASC) 5 MG tablet Take 5 mg by mouth at bedtime.   aspirin EC 81 MG tablet Take 81 mg by mouth at bedtime.   Blood Glucose Monitoring Suppl (ONE TOUCH ULTRA 2) w/Device KIT Use to obtain blood sugar daily. Dx Code E11.40   carvedilol (COREG) 6.25 MG tablet Take 6.25 mg by mouth 2 (two) times daily.   clopidogrel (PLAVIX) 75 MG tablet Take 1 tablet (75 mg total) by mouth daily. (Patient taking differently: Take 75 mg by mouth at bedtime.)   DHA-Vitamin C-Lutein (EYE HEALTH FORMULA PO) Take 1 tablet by mouth daily.    furosemide (LASIX) 40 MG tablet Take 1 tablet (40 mg total) by mouth every Monday, Wednesday, and Friday.   glucose blood (ONE TOUCH ULTRA TEST) test strip USE TO CHECK BLOOD SUGAR ONCE A DAY Dx Code E11.40   HYDROcodone-acetaminophen (NORCO) 10-325 MG tablet Take 1-2 tablets by mouth 2 (two) times daily as needed for severe pain. Must last 30 days.   [START ON 01/09/2021] HYDROcodone-acetaminophen (NORCO) 10-325 MG tablet Take 1-2 tablets by mouth 2 (two) times daily as needed for severe pain. Must last 30 days.   [START ON 02/08/2021] HYDROcodone-acetaminophen (NORCO) 10-325 MG tablet Take 1-2 tablets by mouth 2 (two) times daily as needed for severe pain. Must last 30 days.   losartan (COZAAR) 25 MG tablet Take 25 mg by mouth every evening.   Multiple Vitamins-Minerals (EYE MULTIVITAMIN PO) Take 1 tablet by mouth daily. (Patient not  taking: Reported on 12/26/2020)   nitroGLYCERIN (NITROSTAT) 0.4 MG SL tablet DISSOLVE 1 TABLET UNDER TONGUE AS NEEDEDFOR CHEST PAIN. MAY REPEAT 5 MINUTES APART 3 TIMES IF NEEDED. IF NO RELIEF CALL 911   OneTouch Delica Lancets 33G MISC 1 each by In Vitro route daily. Dx Code E11.49   OXYGEN Inhale into the lungs. Per pt- Uses Oxygen 2 liters at night.   pantoprazole (PROTONIX) 40 MG tablet TAKE 1 TABLET BY MOUTH TWICE (2) DAILY   polyethylene glycol (MIRALAX / GLYCOLAX) 17 g packet Take 51 g by mouth every other day.    pregabalin (LYRICA) 50 MG capsule Take 1 capsule (50 mg total) by mouth at bedtime.   Semaglutide (RYBELSUS) 3 MG TABS Take 1 tablet by mouth daily. (Patient not taking: Reported on 12/26/2020)   tamsulosin (FLOMAX) 0.4 MG CAPS capsule Take 0.4 mg by mouth at bedtime. Takes Monday, Wednesday, Friday   No facility-administered encounter medications on file as of 01/08/2021.    Recent Relevant Labs: Lab Results  Component Value Date/Time   HGBA1C 6.9 (A) 12/26/2020 12:42 PM   HGBA1C 7.9 (A) 09/19/2020 01:37 PM   HGBA1C 8.9 (H) 04/11/2020 03:17 AM   HGBA1C 8.0 (H) 06/13/2019 10:57 AM   MICROALBUR 7.5 (H) 04/28/2018 12:28 PM   MICROALBUR 0.5 02/04/2009 10:09 AM    Kidney Function Lab Results  Component Value Date/Time   CREATININE 2.00 (H) 10/29/2020 03:08 PM   CREATININE 1.91 (H) 09/29/2020 01:16   PM   CREATININE 2.28 (H) 06/24/2017 11:36 AM   CREATININE 2.43 (H) 05/27/2017 02:09 PM   GFR 29.52 (L) 10/29/2020 03:08 PM   GFRNONAA 34 (L) 09/29/2020 01:16 PM   GFRNONAA 26 (L) 06/24/2017 11:36 AM   GFRAA 30 (L) 06/24/2017 11:36 AM   Attempted contact with the patient 3 times on 10/06, 10/07 and 10/12. Unsuccessful outreach. Will attempt contact next month.   Current antihyperglycemic regimen:  Patient is no longer taking Rybelsus. Dr. Silvio Pate on 12/26/2020 visit indicated Diabetes was better even without the Rybelsus.   Adherence Review: Is the patient currently on a  STATIN medication? No Is the patient currently on ACE/ARB medication? Yes Does the patient have >5 day gap between last estimated fill dates? No  Care Gaps: Annual wellness visit in last year? Yes 06/20/2020 Most recent A1C reading: 6.9 on 12/26/2020 Most Recent BP reading: 124/62 on 12/26/2020  Last eye exam / retinopathy screening: 02/14/2020 Last diabetic foot exam: 12/26/2020  Counseled patient on importance of annual eye and foot exam.   Star Rating Drugs:  Medication:  Last Fill: Day Supply Losartan 47m 10/27/2020 90  No appointments scheduled within the next 30 days.  MDebbora Dus CPP notified  AMarijean Niemann RUtahClinical Pharmacy Assistant [phone] Time Spent: 20 Minutes  I have reviewed the care management and care coordination activities outlined in this encounter and I am certifying that I agree with the content of this note. No further action required.  MDebbora Dus PharmD Clinical Pharmacist LJacksboroPrimary Care at [phone]

## 2021-01-09 DIAGNOSIS — Z20822 Contact with and (suspected) exposure to covid-19: Secondary | ICD-10-CM | POA: Diagnosis not present

## 2021-01-09 DIAGNOSIS — Z03818 Encounter for observation for suspected exposure to other biological agents ruled out: Secondary | ICD-10-CM | POA: Diagnosis not present

## 2021-01-12 DIAGNOSIS — Z5111 Encounter for antineoplastic chemotherapy: Secondary | ICD-10-CM | POA: Diagnosis not present

## 2021-01-12 DIAGNOSIS — C674 Malignant neoplasm of posterior wall of bladder: Secondary | ICD-10-CM | POA: Diagnosis not present

## 2021-01-15 DIAGNOSIS — R0689 Other abnormalities of breathing: Secondary | ICD-10-CM | POA: Diagnosis not present

## 2021-01-15 DIAGNOSIS — J8410 Pulmonary fibrosis, unspecified: Secondary | ICD-10-CM | POA: Diagnosis not present

## 2021-01-17 ENCOUNTER — Other Ambulatory Visit: Payer: Self-pay | Admitting: Internal Medicine

## 2021-01-19 DIAGNOSIS — Z5111 Encounter for antineoplastic chemotherapy: Secondary | ICD-10-CM | POA: Diagnosis not present

## 2021-01-19 DIAGNOSIS — C674 Malignant neoplasm of posterior wall of bladder: Secondary | ICD-10-CM | POA: Diagnosis not present

## 2021-01-26 DIAGNOSIS — C674 Malignant neoplasm of posterior wall of bladder: Secondary | ICD-10-CM | POA: Diagnosis not present

## 2021-01-26 DIAGNOSIS — Z5111 Encounter for antineoplastic chemotherapy: Secondary | ICD-10-CM | POA: Diagnosis not present

## 2021-01-26 NOTE — Progress Notes (Deleted)
Office Visit Note  Patient: [name]             Date of Birth: 06-Sep-1931           MRN: [id number]             PCP: [name], MD Referring: [name], MD Visit Date: 02/06/2021 Occupation: @GUAROCC @  Subjective:  No chief complaint on file.   History of Present Illness: [name] is a 87 y.o. male ***   Activities of Daily Living:  Patient reports morning stiffness for *** {minute/hour:19697}.   Patient {ACTIONS;[name]/REPORTS:[id number]::"[name]"} nocturnal pain.  Difficulty dressing/grooming: {ACTIONS;[name]/REPORTS:[id number]::"[name]"} Difficulty climbing stairs: {ACTIONS;[name]/REPORTS:[id number]::"[name]"} Difficulty getting out of chair: {ACTIONS;[name]/REPORTS:[id number]::"[name]"} Difficulty using hands for taps, buttons, cutlery, and/or writing: {ACTIONS;[name]/REPORTS:[id number]::"[name]"}  No Rheumatology ROS completed.   PMFS History:  Patient Active Problem List   Diagnosis Date Noted   Cystitis 10/29/2020   Weakness 10/29/2020   Bladder mass 08/26/2020   Macular degeneration, wet ([place]) 06/18/2020   Pain due to onychomycosis of toenails of both feet 05/22/2020   Ventricular tachycardia 04/23/2020   Chronic narcotic dependence ([place]) 12/14/2019   Plantar flexed metatarsal bone of left foot 11/08/2019   Long term prescription opiate use 12/14/2018   Neuropathy 12/14/2018   Problems with swallowing and mastication    Achalasia    Right inguinal hernia 07/04/2018   Advance directive discussed with patient 04/28/2018   Chronic pain of both lower extremities 10/26/2017   Neuropathic pain 10/26/2017   Chronic pain syndrome 10/26/2017   Iron deficiency anemia 07/05/2017   Constipation 07/05/2017   CKD (chronic kidney disease) stage 4, GFR 15-29 ml/min ([name]) 05/17/2017   Seropositive rheumatoid arthritis ([place]) 05/17/2017   DM (diabetes mellitus) type II controlled, neurological manifestation ([place]) 05/17/2017   Atherosclerotic heart  disease of native coronary artery with angina pectoris ([place]) 05/17/2017   Dysphagia 05/17/2017   High risk medication use 10/21/2016   Primary osteoarthritis of both knees 10/21/2016   History of right knee joint replacement 10/21/2016   Orthostatic hypotension 10/01/2016   Spinal stenosis of lumbar region without neurogenic claudication 03/23/2016   Lumbar facet arthropathy 03/23/2016   Respiratory failure, chronic ([place]) 11/21/2014   Rheumatoid arthritis ([place]) 11/05/2014   Rheumatic fever without heart involvement 03/04/2014   Ventral hernia 12/17/2013   Routine general medical examination at a health care facility 08/29/2012   ILD (interstitial lung disease) ([place]) 11/28/2011   Chronic diastolic heart failure ([place]) 09/14/2011   Nausea 02/16/2011   Diabetic polyneuropathy associated with type 2 diabetes mellitus ([place]) 08/10/2010   Type 2 diabetes mellitus with diabetic polyneuropathy ([place]) 08/10/2010   Obstructive sleep apnea 12/17/2008   ESOPHAGEAL STRICTURE 10/09/2008   Esophageal stricture 10/09/2008   Reflux esophagitis 09/10/2008   Diverticulosis of colon 09/10/2008   Gastro-esophageal reflux disease with esophagitis 09/10/2008   Coronary atherosclerosis 03/19/2008   Aortic valve disorder 03/19/2008   Thoracic aorta atherosclerosis ([place]) 03/19/2008   SLEEP DISORDER, CHRONIC 10/17/2006   Disturbance in sleep behavior 10/17/2006   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   Essential hypertension 09/23/2006   GERD 09/23/2006   BENIGN PROSTATIC HYPERTROPHY 09/23/2006   Enlarged prostate without lower urinary tract symptoms (luts) 09/23/2006   HLD (hyperlipidemia) 09/23/2006    Past Medical History:  Diagnosis Date   Arthritis    osteoarthritis, s/p R TKR, and digits   CAD (coronary artery disease)    a. s/p CABG (2001)  b. s/p DES to RCA and cutting POBA to ostial PDA (  2013)   c. s/p DES to SVG to OM2 (01/14/14) d. cath: 08/2015 NSTEMI w/ patent LIMA-LAD  and 99% stenosis of SVG-OM w/ DES placed. CTO of SVG-RCA and SVG-D1.    Cataract    Chronic diastolic CHF (congestive heart failure) ([place])    a) 09/13 ECHO- LVEF 11-91%, grade 1 diastolic dysfunction, mild LA dilatation, atrial septal aneurysm, AV mobility restricted, but no sig AS by doppler; b) 09/04/08 ECHO- LVH, ef 60%, mild AS, c. echo 08/2015: EF perserved of 55-60% with inferolateral HK. Mild AS noted.   Chronic kidney disease, stage III (moderate) ([name])    Chronic lower back pain    Colon polyps    COVID-19    Diverticulosis    Dyspnea 2009 since July -Sept   05/06/08-CPST-  normal effort, reduced VO2 max 20.5 /65%, reduced at 8.2/ 40%, normal breathing resetvca of 55%, submaximal heart rate response 112/77%, flattened o2 pluse response at peak exercise-12 ml/beat @ 85%, No VQ mismatch abnormalities, All c/w CIRC Limitation   Enlarged prostate    Esophageal stricture    a. s/p dilation spring 2010   GERD (gastroesophageal reflux disease)    Heart murmur    Hiatal hernia    History of carpal tunnel syndrome    Bilateral   History of kidney stones    History of PFTs    mixed pattern on spiro. mild restn on lung volumes with near normal DLCO. Pattern can be explained by CABG scar. Fev1 2.2L/73%, ratio 68 (67), TLC 4.7/68%,RV 1.5L/55%,DLCO 79%   Hyperlipidemia    Hypertension    Interstitial lung disease ([name])    NOS   Iron deficiency anemia    Nausea & vomiting    2018/2019   On home oxygen therapy    2 L [place] at bedtime   Osteoporosis    Overweight (BMI 25.0-29.9)    BMI 29   Peripheral neuropathy    RA (rheumatoid arthritis) ([name])    [name]   Seropositive rheumatoid arthritis ([place])    Type II diabetes mellitus ([place])    diet controlled   Wears glasses    Wears partial dentures    upper    Family History  Problem Relation Age of Onset   COPD Mother    Heart disease Father    Heart attack Father    Stomach cancer Brother    Stroke Sister    Alcohol abuse Sister     Colon cancer Brother 25   Diabetes Brother    Rectal cancer Neg Hx    Past Surgical History:  Procedure Laterality Date   BALLOON DILATION N/A 09/12/2018   Procedure: BALLOON DILATION;  Surgeon: Irene Shipper, MD;  Location: [facility];  Service: Endoscopy;  Laterality: N/A;   BALLOON DILATION N/A 02/20/2020   Procedure: BALLOON DILATION;  Surgeon: Irene Shipper, MD;  Location: [facility];  Service: Endoscopy;  Laterality: N/A;   BOTOX INJECTION N/A 09/12/2018   Procedure: BOTOX INJECTION;  Surgeon: Irene Shipper, MD;  Location: [facility];  Service: Endoscopy;  Laterality: N/A;   BOTOX INJECTION N/A 11/27/2018   Procedure: BOTOX INJECTION;  Surgeon: Irene Shipper, MD;  Location: [facility];  Service: Endoscopy;  Laterality: N/A;   BOTOX INJECTION N/A 02/20/2020   Procedure: BOTOX INJECTION;  Surgeon: Irene Shipper, MD;  [facility]: [facility];  Service: Endoscopy;  Laterality: N/A;   CARDIAC CATHETERIZATION  08/2004   CP- no MI, Cath- small vessell disease    CARDIAC  CATHETERIZATION  12/31/2011   80% distal LM, 100% native LAD, LCx and RCA, 30% prox SVG-OM, SVG-D1 normal, 99% distal, 80% ostial SVG-RCA distal to graft, LIMA-LAD normal; LVEF mildly decreased with posterior basal AK    CARDIAC CATHETERIZATION  2009   with patent grafts/notes 12/31/2011   CARDIAC CATHETERIZATION N/A 08/13/2015   Procedure: Left Heart Cath and Cors/Grafts Angiography;  Surgeon: Sherren Mocha, MD;  Location: [facility];  Service: Cardiovascular;  Laterality: N/A;   CATARACT EXTRACTION W/ INTRAOCULAR LENS  IMPLANT, BILATERAL Bilateral    CHOLECYSTECTOMY OPEN  11/2003   [name]   CORONARY ANGIOPLASTY WITH STENT PLACEMENT  01/03/2012   Successful DES to SVG-RCA and cutting balloon angioplasty ostial  PDA    CORONARY ANGIOPLASTY WITH STENT PLACEMENT  01/14/2014   "1"   CORONARY ARTERY BYPASS GRAFT  11/1999   CABG X5   CORONARY STENT PLACEMENT  02/2012   1 stent and balloon   ESOPHAGEAL DILATION   11/27/2018   Procedure: ESOPHAGEAL DILATION;  Surgeon: Irene Shipper, MD;  [facility]: [facility];  Service: Endoscopy;;   ESOPHAGOGASTRODUODENOSCOPY N/A 03/01/2017   Procedure: ESOPHAGOGASTRODUODENOSCOPY (EGD);  Surgeon: Irene Shipper, MD;  Location: [facility];  Service: Endoscopy;  Laterality: N/A;   ESOPHAGOGASTRODUODENOSCOPY (EGD) WITH ESOPHAGEAL DILATION  2010   ESOPHAGOGASTRODUODENOSCOPY (EGD) WITH PROPOFOL N/A 09/12/2018   Procedure: ESOPHAGOGASTRODUODENOSCOPY (EGD) WITH PROPOFOL;  Surgeon: Irene Shipper, MD;  [facility];  Service: Endoscopy;  Laterality: N/A;   ESOPHAGOGASTRODUODENOSCOPY (EGD) WITH PROPOFOL N/A 11/27/2018   Procedure: ESOPHAGOGASTRODUODENOSCOPY (EGD) WITH PROPOFOL, WITH BALLOON DILATION;  Surgeon: Irene Shipper, MD;  [facility];  Service: Endoscopy;  Laterality: N/A;   ESOPHAGOGASTRODUODENOSCOPY (EGD) WITH PROPOFOL N/A 02/20/2020   Procedure: ESOPHAGOGASTRODUODENOSCOPY (EGD) WITH PROPOFOL;  Surgeon: Irene Shipper, MD;  [facility];  Service: Endoscopy;  Laterality: N/A;   HAND SURGERY     bilateral carpal tunnel releases   JOINT REPLACEMENT     KNEE ARTHROSCOPY Right 2008   LEFT AND RIGHT HEART CATHETERIZATION WITH CORONARY ANGIOGRAM  12/31/2011   Procedure: LEFT AND RIGHT HEART CATHETERIZATION WITH CORONARY ANGIOGRAM;  Surgeon: Burnell Blanks, MD;  Location[facility] C[facility] LAB;  Service: Cardiovascular;;   LEFT AND RIGHT HEART CATHETERIZATION WITH CORONARY ANGIOGRAM N/A 01/14/2014   Procedure: LEFT AND RIGHT HEART CATHETERIZATION WITH CORONARY ANGIOGRAM;  Surgeon: Peter M Martinique, MD;  Location: [facility] CATH LAB;  Service: Cardiovascular;  Laterality: N/A;   MALONEY DILATION  03/01/2017   Procedure: Venia Minks DILATION;  Surgeon: Irene Shipper, MD;  Location: [facility];  Service: Endoscopy;;   PERCUTANEOUS CORONARY INTERVENTION-BALLOON ONLY  01/03/2012   Procedure: PERCUTANEOUS CORONARY INTERVENTION-BALLOON ONLY;  Surgeon: Peter M Martinique, MD;   Location: [facility] CATH LAB;  Service: Cardiovascular;;   PERCUTANEOUS CORONARY STENT INTERVENTION (PCI-S)  12/31/2011   Procedure: PERCUTANEOUS CORONARY STENT INTERVENTION (PCI-S);  Surgeon: Burnell Blanks, MD;  Location: [facility] CATH LAB;  Service: Cardiovascular;;   PERCUTANEOUS CORONARY STENT INTERVENTION (PCI-S) N/A 01/03/2012   Procedure: PERCUTANEOUS CORONARY STENT INTERVENTION (PCI-S);  Surgeon: Peter M Martinique, MD;  Location: [facility] CATH LAB;  Service: Cardiovascular;  Laterality: N/A;   SHOULDER ARTHROSCOPY WITH OPEN ROTATOR CUFF REPAIR AND DISTAL CLAVICLE ACROMINECTOMY Left 02/27/2013   Procedure: LEFT SHOULDER ARTHROSCOPY WITH MINI OPEN ROTATOR CUFF REPAIR AND SUBACROMIAL DECOMPRESSION AND DISTAL CLAVICLE RESECTION;  Surgeon: [name], MD;  Location: [facility];  Service: Orthopedics;  Laterality: Left;   TOTAL KNEE ARTHROPLASTY Right 03/2010   [name]  TRANSURETHRAL RESECTION OF BLADDER TUMOR WITH MITOMYCIN-C N/A 10/08/2020   Procedure: TRANSURETHRAL RESECTION OF BLADDER TUMOR WITH GEMCITABINE;  Surgeon: Robley Fries, MD;  Location: [facility];  Service: Urology;  Laterality: N/A;  75 MINS   TRIGGER FINGER RELEASE Left 02/27/2013   Procedure: RELEASE TRIGGER FINGER/A-1 PULLEY;  Surgeon: [name], MD;  Location: [facility];  Service: Orthopedics;  Laterality: Left;   Social History   Social History Narrative   No living will   Requests wife as health care POA-- alternate is daughter Hassan Rowan   Discussed DNR --he requests this (done 08/29/12)   Not sure about feeding tube---but might accept for some time   Patient lives with wife and daughter in a one story home.  Has 3 children.  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   Immunization History  Administered Date(s) Administered   Fluad Quad(high Dose 65+) 12/26/2020   H1N1 03/27/2008   Influenza Split 12/29/2010, 01/18/2012   Influenza Whole 02/03/2007, 01/09/2008, 01/01/2009, 12/31/2009   Influenza,inj,Quad  PF,6+ Mos 12/13/2012, 01/15/2014, 01/15/2015, 01/06/2016, 01/03/2017, 02/09/2018, 01/30/2019, 01/05/2020   PFIZER(Purple Top)SARS-COV-2 Vaccination 04/26/2019, 05/17/2019   Pneumococcal Conjugate-13 09/10/2013   Pneumococcal Polysaccharide-23 03/05/1997, 08/17/2011   Td 03/05/1997, 08/17/2011     Objective: Vital Signs: There were no vitals taken for this visit.   Physical Exam   Musculoskeletal Exam: ***  CDAI Exam: CDAI Score: -- Patient Global: --; Provider Global: -- Swollen: --; Tender: -- Joint Exam 02/06/2021   No joint exam has been documented for this visit   There is currently no information documented on the homunculus. Go to the Rheumatology activity and complete the homunculus joint exam.  Investigation: No additional findings.  Imaging: No results found.  Recent Labs: Lab Results  Component Value Date   WBC 9.3 10/29/2020   HGB 12.2 (L) 10/29/2020   PLT 347.0 10/29/2020   NA 137 10/29/2020   K 4.9 10/29/2020   CL 98 10/29/2020   CO2 30 10/29/2020   GLUCOSE 111 (H) 10/29/2020   BUN 23 10/29/2020   CREATININE 2.00 (H) 10/29/2020   BILITOT 0.4 10/29/2020   ALKPHOS 64 10/29/2020   AST 13 10/29/2020   ALT 8 10/29/2020   PROT 7.7 10/29/2020   ALBUMIN 3.7 10/29/2020   CALCIUM 10.1 10/29/2020   GFRAA 30 (L) 06/24/2017    Speciality Comments: D/c Imuran and Cellcept in the past due to GI SE  Procedures:  No procedures performed Allergies: Doxazosin mesylate and Methocarbamol   Assessment / Plan:     Visit Diagnoses: No diagnosis found.  Orders: No orders of the defined types were placed in this encounter.  No orders of the defined types were placed in this encounter.   Face-to-face time spent with patient was *** minutes. Greater than 50% of time was spent in counseling and coordination of care.  Follow-Up Instructions: No follow-ups on file.   Earnestine Mealing, CMA  Note - This record has been created using Editor, commissioning.  Chart  creation errors have been sought, but may not always  have been located. Such creation errors do not reflect on  the standard of medical care.

## 2021-01-27 ENCOUNTER — Emergency Department (HOSPITAL_COMMUNITY): Payer: Medicare Other

## 2021-01-27 ENCOUNTER — Telehealth: Payer: Self-pay

## 2021-01-27 ENCOUNTER — Telehealth (HOSPITAL_COMMUNITY): Payer: Self-pay | Admitting: Emergency Medicine

## 2021-01-27 ENCOUNTER — Emergency Department (HOSPITAL_COMMUNITY)
Admission: EM | Admit: 2021-01-27 | Discharge: 2021-01-27 | Disposition: A | Discharge status: Home / Self Care | Payer: Medicare Other | Source: Home / Self Care | Attending: Emergency Medicine | Admitting: Emergency Medicine

## 2021-01-27 DIAGNOSIS — N390 Urinary tract infection, site not specified: Secondary | ICD-10-CM | POA: Insufficient documentation

## 2021-01-27 DIAGNOSIS — G4733 Obstructive sleep apnea (adult) (pediatric): Secondary | ICD-10-CM | POA: Diagnosis not present

## 2021-01-27 DIAGNOSIS — I5032 Chronic diastolic (congestive) heart failure: Secondary | ICD-10-CM | POA: Insufficient documentation

## 2021-01-27 DIAGNOSIS — Z743 Need for continuous supervision: Secondary | ICD-10-CM | POA: Diagnosis not present

## 2021-01-27 DIAGNOSIS — I2510 Atherosclerotic heart disease of native coronary artery without angina pectoris: Secondary | ICD-10-CM | POA: Insufficient documentation

## 2021-01-27 DIAGNOSIS — E119 Type 2 diabetes mellitus without complications: Secondary | ICD-10-CM | POA: Diagnosis not present

## 2021-01-27 DIAGNOSIS — M059 Rheumatoid arthritis with rheumatoid factor, unspecified: Secondary | ICD-10-CM | POA: Diagnosis not present

## 2021-01-27 DIAGNOSIS — I130 Hypertensive heart and chronic kidney disease with heart failure and stage 1 through stage 4 chronic kidney disease, or unspecified chronic kidney disease: Secondary | ICD-10-CM | POA: Insufficient documentation

## 2021-01-27 DIAGNOSIS — R319 Hematuria, unspecified: Secondary | ICD-10-CM | POA: Diagnosis not present

## 2021-01-27 DIAGNOSIS — G894 Chronic pain syndrome: Secondary | ICD-10-CM | POA: Diagnosis not present

## 2021-01-27 DIAGNOSIS — N200 Calculus of kidney: Secondary | ICD-10-CM | POA: Diagnosis not present

## 2021-01-27 DIAGNOSIS — I10 Essential (primary) hypertension: Secondary | ICD-10-CM | POA: Diagnosis not present

## 2021-01-27 DIAGNOSIS — E1142 Type 2 diabetes mellitus with diabetic polyneuropathy: Secondary | ICD-10-CM | POA: Insufficient documentation

## 2021-01-27 DIAGNOSIS — J849 Interstitial pulmonary disease, unspecified: Secondary | ICD-10-CM | POA: Diagnosis not present

## 2021-01-27 DIAGNOSIS — Z66 Do not resuscitate: Secondary | ICD-10-CM | POA: Diagnosis not present

## 2021-01-27 DIAGNOSIS — N3289 Other specified disorders of bladder: Secondary | ICD-10-CM | POA: Diagnosis not present

## 2021-01-27 DIAGNOSIS — Z8616 Personal history of COVID-19: Secondary | ICD-10-CM | POA: Insufficient documentation

## 2021-01-27 DIAGNOSIS — Z7982 Long term (current) use of aspirin: Secondary | ICD-10-CM | POA: Insufficient documentation

## 2021-01-27 DIAGNOSIS — R509 Fever, unspecified: Secondary | ICD-10-CM | POA: Diagnosis not present

## 2021-01-27 DIAGNOSIS — Z7902 Long term (current) use of antithrombotics/antiplatelets: Secondary | ICD-10-CM | POA: Insufficient documentation

## 2021-01-27 DIAGNOSIS — M545 Low back pain, unspecified: Secondary | ICD-10-CM | POA: Diagnosis not present

## 2021-01-27 DIAGNOSIS — I700 Atherosclerosis of aorta: Secondary | ICD-10-CM | POA: Diagnosis not present

## 2021-01-27 DIAGNOSIS — J9611 Chronic respiratory failure with hypoxia: Secondary | ICD-10-CM | POA: Diagnosis not present

## 2021-01-27 DIAGNOSIS — I499 Cardiac arrhythmia, unspecified: Secondary | ICD-10-CM | POA: Diagnosis not present

## 2021-01-27 DIAGNOSIS — E872 Acidosis, unspecified: Secondary | ICD-10-CM | POA: Diagnosis not present

## 2021-01-27 DIAGNOSIS — E1122 Type 2 diabetes mellitus with diabetic chronic kidney disease: Secondary | ICD-10-CM | POA: Insufficient documentation

## 2021-01-27 DIAGNOSIS — J449 Chronic obstructive pulmonary disease, unspecified: Secondary | ICD-10-CM | POA: Diagnosis not present

## 2021-01-27 DIAGNOSIS — E1165 Type 2 diabetes mellitus with hyperglycemia: Secondary | ICD-10-CM | POA: Diagnosis not present

## 2021-01-27 DIAGNOSIS — N184 Chronic kidney disease, stage 4 (severe): Secondary | ICD-10-CM | POA: Insufficient documentation

## 2021-01-27 DIAGNOSIS — Z20822 Contact with and (suspected) exposure to covid-19: Secondary | ICD-10-CM | POA: Insufficient documentation

## 2021-01-27 DIAGNOSIS — Z96651 Presence of right artificial knee joint: Secondary | ICD-10-CM | POA: Insufficient documentation

## 2021-01-27 DIAGNOSIS — E1140 Type 2 diabetes mellitus with diabetic neuropathy, unspecified: Secondary | ICD-10-CM | POA: Diagnosis not present

## 2021-01-27 DIAGNOSIS — N3001 Acute cystitis with hematuria: Secondary | ICD-10-CM | POA: Diagnosis not present

## 2021-01-27 DIAGNOSIS — R0689 Other abnormalities of breathing: Secondary | ICD-10-CM | POA: Diagnosis not present

## 2021-01-27 DIAGNOSIS — A411 Sepsis due to other specified staphylococcus: Secondary | ICD-10-CM | POA: Diagnosis not present

## 2021-01-27 DIAGNOSIS — Z951 Presence of aortocoronary bypass graft: Secondary | ICD-10-CM | POA: Insufficient documentation

## 2021-01-27 DIAGNOSIS — K219 Gastro-esophageal reflux disease without esophagitis: Secondary | ICD-10-CM | POA: Diagnosis not present

## 2021-01-27 DIAGNOSIS — Z9981 Dependence on supplemental oxygen: Secondary | ICD-10-CM | POA: Diagnosis not present

## 2021-01-27 DIAGNOSIS — Z79899 Other long term (current) drug therapy: Secondary | ICD-10-CM | POA: Insufficient documentation

## 2021-01-27 DIAGNOSIS — Z87891 Personal history of nicotine dependence: Secondary | ICD-10-CM | POA: Insufficient documentation

## 2021-01-27 DIAGNOSIS — R7881 Bacteremia: Secondary | ICD-10-CM | POA: Diagnosis not present

## 2021-01-27 DIAGNOSIS — R6889 Other general symptoms and signs: Secondary | ICD-10-CM | POA: Diagnosis not present

## 2021-01-27 DIAGNOSIS — D72829 Elevated white blood cell count, unspecified: Secondary | ICD-10-CM | POA: Insufficient documentation

## 2021-01-27 DIAGNOSIS — C679 Malignant neoplasm of bladder, unspecified: Secondary | ICD-10-CM | POA: Diagnosis not present

## 2021-01-27 DIAGNOSIS — R918 Other nonspecific abnormal finding of lung field: Secondary | ICD-10-CM | POA: Diagnosis not present

## 2021-01-27 DIAGNOSIS — E785 Hyperlipidemia, unspecified: Secondary | ICD-10-CM | POA: Diagnosis not present

## 2021-01-27 LAB — PROTIME-INR
INR: 1.1 (ref 0.8–1.2)
Prothrombin Time: 13.7 seconds (ref 11.4–15.2)

## 2021-01-27 LAB — CBC WITH DIFFERENTIAL/PLATELET
Abs Immature Granulocytes: 0.12 10*3/uL — ABNORMAL HIGH (ref 0.00–0.07)
Basophils Absolute: 0.1 10*3/uL (ref 0.0–0.1)
Basophils Relative: 0 %
Eosinophils Absolute: 0 10*3/uL (ref 0.0–0.5)
Eosinophils Relative: 0 %
HCT: 39.4 % (ref 39.0–52.0)
Hemoglobin: 12.4 g/dL — ABNORMAL LOW (ref 13.0–17.0)
Immature Granulocytes: 1 %
Lymphocytes Relative: 5 %
Lymphs Abs: 0.9 10*3/uL (ref 0.7–4.0)
MCH: 26.7 pg (ref 26.0–34.0)
MCHC: 31.5 g/dL (ref 30.0–36.0)
MCV: 84.9 fL (ref 80.0–100.0)
Monocytes Absolute: 0.7 10*3/uL (ref 0.1–1.0)
Monocytes Relative: 4 %
Neutro Abs: 16.9 10*3/uL — ABNORMAL HIGH (ref 1.7–7.7)
Neutrophils Relative %: 90 %
Platelets: 240 10*3/uL (ref 150–400)
RBC: 4.64 MIL/uL (ref 4.22–5.81)
RDW: 14.5 % (ref 11.5–15.5)
WBC: 18.7 10*3/uL — ABNORMAL HIGH (ref 4.0–10.5)
nRBC: 0 % (ref 0.0–0.2)

## 2021-01-27 LAB — COMPREHENSIVE METABOLIC PANEL
ALT: 20 U/L (ref 0–44)
AST: 30 U/L (ref 15–41)
Albumin: 3.3 g/dL — ABNORMAL LOW (ref 3.5–5.0)
Alkaline Phosphatase: 79 U/L (ref 38–126)
Anion gap: 9 (ref 5–15)
BUN: 35 mg/dL — ABNORMAL HIGH (ref 8–23)
CO2: 24 mmol/L (ref 22–32)
Calcium: 9.6 mg/dL (ref 8.9–10.3)
Chloride: 104 mmol/L (ref 98–111)
Creatinine, Ser: 2.19 mg/dL — ABNORMAL HIGH (ref 0.61–1.24)
GFR, Estimated: 28 mL/min — ABNORMAL LOW (ref >60)
Glucose, Bld: 236 mg/dL — ABNORMAL HIGH (ref 70–99)
Potassium: 4.5 mmol/L (ref 3.5–5.1)
Sodium: 137 mmol/L (ref 135–145)
Total Bilirubin: 1 mg/dL (ref 0.3–1.2)
Total Protein: 8.1 g/dL (ref 6.5–8.1)

## 2021-01-27 LAB — URINALYSIS, ROUTINE W REFLEX MICROSCOPIC
Bilirubin Urine: NEGATIVE
Glucose, UA: 50 mg/dL — AB
Ketones, ur: 5 mg/dL — AB
Nitrite: NEGATIVE
Protein, ur: 100 mg/dL — AB
Specific Gravity, Urine: 1.014 (ref 1.005–1.030)
WBC, UA: >50 WBC/hpf — ABNORMAL HIGH (ref 0–5)
pH: 6 (ref 5.0–8.0)

## 2021-01-27 LAB — BLOOD CULTURE ID PANEL (REFLEXED) - BCID2

## 2021-01-27 LAB — LACTIC ACID, PLASMA: Lactic Acid, Venous: 1.2 mmol/L (ref 0.5–1.9)

## 2021-01-27 LAB — RESP PANEL BY RT-PCR (FLU A&B, COVID) ARPGX2
Influenza A by PCR: NEGATIVE
Influenza B by PCR: NEGATIVE
SARS Coronavirus 2 by RT PCR: NEGATIVE

## 2021-01-27 LAB — APTT: aPTT: 27 seconds (ref 24–36)

## 2021-01-27 LAB — LIPASE, BLOOD: Lipase: 32 U/L (ref 11–51)

## 2021-01-27 MED ORDER — CEFPODOXIME PROXETIL 100 MG PO TABS: 100.0000 mg | ORAL_TABLET | Freq: Two times a day (BID) | ORAL | 0 refills | Status: DC

## 2021-01-27 MED ORDER — SODIUM CHLORIDE 0.9 % IV SOLN
2.0000 g | Freq: Once | INTRAVENOUS | Status: AC
  Administered 2021-01-27: 2 g via INTRAVENOUS
  Filled 2021-01-27: qty 20

## 2021-01-27 MED ORDER — LACTATED RINGERS IV BOLUS
1000.0000 mL | Freq: Once | INTRAVENOUS | Status: AC
  Administered 2021-01-27: 1000 mL via INTRAVENOUS

## 2021-01-27 NOTE — Discharge Instructions (Addendum)
Please return for worsening symptoms, inability to eat or drink, confusion.  Take tylenol 2 pills 4 times a day.  Drink plenty of fluids.

## 2021-01-27 NOTE — Telephone Encounter (Signed)
Spoke to pt to see how he was doing after recent ER visit. He said he is doing a lot better. I have scheduled him for a ER F/U on 02-04-21.

## 2021-01-27 NOTE — Telephone Encounter (Signed)
Patient with positive blood cultures. Unable to reach patient about need to return for IV abx and admission. I discussed results with his daughter Francee Gentile who will contact her father and have him return for IV abx and admission.

## 2021-01-27 NOTE — ED Triage Notes (Signed)
Pt from home c/o fever, weakness, tachypnea.  [age]F w EMS, 1g tylenol admin RR 40-50/min Hx bladder cancer, just finished chemo  20g L hand

## 2021-01-27 NOTE — ED Notes (Signed)
Pt discharged and wheeled out of the ED in a wheel chair without difficulty. 

## 2021-01-27 NOTE — ED Provider Notes (Signed)
[facility] EMERGENCY DEPARTMENT Provider Note   CSN: [id number] Arrival date & time: 01/27/21  0345     History Chief Complaint  Patient presents with   Fever    [name] is a 87 y.o. male.  87 yo M with a chief complaint of fevers and chills.  Going on for about 48 hours now.  Patient has a little bit of a cough but denies any other specific symptoms.  Has been eating a little bit less.  Some mild abdominal discomfort.  He denies diarrhea denies sick contacts denies rash.  History of bladder cancer.  The history is provided by the patient.  Fever Associated symptoms: cough   Associated symptoms: no chest pain, no chills, no confusion, no congestion, no diarrhea, no headaches, no myalgias, no rash and no vomiting   Illness Severity:  Moderate Onset quality:  Gradual Duration:  2 days Timing:  Constant Progression:  Worsening Chronicity:  New Associated symptoms: abdominal pain, cough and fever   Associated symptoms: no chest pain, no congestion, no diarrhea, no headaches, no myalgias, no rash, no shortness of breath and no vomiting       Past Medical History:  Diagnosis Date   Arthritis    osteoarthritis, s/p R TKR, and digits   CAD (coronary artery disease)    a. s/p CABG (2001)  b. s/p DES to RCA and cutting POBA to ostial PDA (2013)   c. s/p DES to SVG to OM2 (01/14/14) d. cath: 08/2015 NSTEMI w/ patent LIMA-LAD and 99% stenosis of SVG-OM w/ DES placed. CTO of SVG-RCA and SVG-D1.    Cataract    Chronic diastolic CHF (congestive heart failure) ([place])    a) 09/13 ECHO- LVEF 42-59%, grade 1 diastolic dysfunction, mild LA dilatation, atrial septal aneurysm, AV mobility restricted, but no sig AS by doppler; b) 09/04/08 ECHO- LVH, ef 60%, mild AS, c. echo 08/2015: EF perserved of 55-60% with inferolateral HK. Mild AS noted.   Chronic kidney disease, stage III (moderate) ([name])    Chronic lower back pain    Colon polyps    COVID-19    Diverticulosis     Dyspnea 2009 since July -Sept   05/06/08-CPST-  normal effort, reduced VO2 max 20.5 /65%, reduced at 8.2/ 40%, normal breathing resetvca of 55%, submaximal heart rate response 112/77%, flattened o2 pluse response at peak exercise-12 ml/beat @ 85%, No VQ mismatch abnormalities, All c/w CIRC Limitation   Enlarged prostate    Esophageal stricture    a. s/p dilation spring 2010   GERD (gastroesophageal reflux disease)    Heart murmur    Hiatal hernia    History of carpal tunnel syndrome    Bilateral   History of kidney stones    History of PFTs    mixed pattern on spiro. mild restn on lung volumes with near normal DLCO. Pattern can be explained by CABG scar. Fev1 2.2L/73%, ratio 68 (67), TLC 4.7/68%,RV 1.5L/55%,DLCO 79%   Hyperlipidemia    Hypertension    Interstitial lung disease ([name])    NOS   Iron deficiency anemia    Nausea & vomiting    2018/2019   On home oxygen therapy    2 L [place] at bedtime   Osteoporosis    Overweight (BMI 25.0-29.9)    BMI 29   Peripheral neuropathy    RA (rheumatoid arthritis) ([place])    [name]   Seropositive rheumatoid arthritis ([place])    Type II diabetes mellitus ([place])  diet controlled   Wears glasses    Wears partial dentures    upper    Patient Active Problem List   Diagnosis Date Noted   Cystitis 10/29/2020   Weakness 10/29/2020   Bladder mass 08/26/2020   Macular degeneration, wet ([place]) 06/18/2020   Pain due to onychomycosis of toenails of both feet 05/22/2020   Ventricular tachycardia 04/23/2020   Chronic narcotic dependence ([place]) 12/14/2019   Plantar flexed metatarsal bone of left foot 11/08/2019   Long term prescription opiate use 12/14/2018   Neuropathy 12/14/2018   Problems with swallowing and mastication    Achalasia    Right inguinal hernia 07/04/2018   Advance directive discussed with patient 04/28/2018   Chronic pain of both lower extremities 10/26/2017   Neuropathic pain 10/26/2017   Chronic pain syndrome 10/26/2017    Iron deficiency anemia 07/05/2017   Constipation 07/05/2017   CKD (chronic kidney disease) stage 4, GFR 15-29 ml/min ([name]) 05/17/2017   Seropositive rheumatoid arthritis ([place]) 05/17/2017   DM (diabetes mellitus) type II controlled, neurological manifestation ([place]) 05/17/2017   Atherosclerotic heart disease of native coronary artery with angina pectoris ([place]) 05/17/2017   Dysphagia 05/17/2017   High risk medication use 10/21/2016   Primary osteoarthritis of both knees 10/21/2016   History of right knee joint replacement 10/21/2016   Orthostatic hypotension 10/01/2016   Spinal stenosis of lumbar region without neurogenic claudication 03/23/2016   Lumbar facet arthropathy 03/23/2016   Respiratory failure, chronic ([place]) 11/21/2014   Rheumatoid arthritis ([place]) 11/05/2014   Rheumatic fever without heart involvement 03/04/2014   Ventral hernia 12/17/2013   Routine general medical examination at a health care facility 08/29/2012   ILD (interstitial lung disease) ([place]) 11/28/2011   Chronic diastolic heart failure ([place]) 09/14/2011   Nausea 02/16/2011   Diabetic polyneuropathy associated with type 2 diabetes mellitus ([place]) 08/10/2010   Type 2 diabetes mellitus with diabetic polyneuropathy ([place]) 08/10/2010   Obstructive sleep apnea 12/17/2008   ESOPHAGEAL STRICTURE 10/09/2008   Esophageal stricture 10/09/2008   Reflux esophagitis 09/10/2008   Diverticulosis of colon 09/10/2008   Gastro-esophageal reflux disease with esophagitis 09/10/2008   Coronary atherosclerosis 03/19/2008   Aortic valve disorder 03/19/2008   Thoracic aorta atherosclerosis ([place]) 03/19/2008   SLEEP DISORDER, CHRONIC 10/17/2006   Disturbance in sleep behavior 10/17/2006   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   Essential hypertension 09/23/2006   GERD 09/23/2006   BENIGN PROSTATIC HYPERTROPHY 09/23/2006   Enlarged prostate without lower urinary tract symptoms (luts) 09/23/2006   HLD (hyperlipidemia)  09/23/2006    Past Surgical History:  Procedure Laterality Date   BALLOON DILATION N/A 09/12/2018   Procedure: BALLOON DILATION;  Surgeon: Irene Shipper, MD;  Location: [facility];  Service: Endoscopy;  Laterality: N/A;   BALLOON DILATION N/A 02/20/2020   Procedure: BALLOON DILATION;  Surgeon: Irene Shipper, MD;  Location: [facility];  Service: Endoscopy;  Laterality: N/A;   BOTOX INJECTION N/A 09/12/2018   Procedure: BOTOX INJECTION;  Surgeon: Irene Shipper, MD;  Location: [facility];  Service: Endoscopy;  Laterality: N/A;   BOTOX INJECTION N/A 11/27/2018   Procedure: BOTOX INJECTION;  Surgeon: Irene Shipper, MD;  [facility]: [facility];  Service: Endoscopy;  Laterality: N/A;   BOTOX INJECTION N/A 02/20/2020   Procedure: BOTOX INJECTION;  Surgeon: Irene Shipper, MD;  Location: [facility];  Service: Endoscopy;  Laterality: N/A;   CARDIAC CATHETERIZATION  08/2004   CP- no MI, Cath- small vessell disease    CARDIAC CATHETERIZATION  12/31/2011  80% distal LM, 100% native LAD, LCx and RCA, 30% prox SVG-OM, SVG-D1 normal, 99% distal, 80% ostial SVG-RCA distal to graft, LIMA-LAD normal; LVEF mildly decreased with posterior basal AK    CARDIAC CATHETERIZATION  2009   with patent grafts/notes 12/31/2011   CARDIAC CATHETERIZATION N/A 08/13/2015   Procedure: Left Heart Cath and Cors/Grafts Angiography;  Surgeon: Sherren Mocha, MD;  Location: [place] [facility];  Service: Cardiovascular;  Laterality: N/A;   CATARACT EXTRACTION W/ INTRAOCULAR LENS  IMPLANT, BILATERAL Bilateral    CHOLECYSTECTOMY OPEN  11/2003   [name]   CORONARY ANGIOPLASTY WITH STENT PLACEMENT  01/03/2012   Successful DES to SVG-RCA and cutting balloon angioplasty ostial  PDA    CORONARY ANGIOPLASTY WITH STENT PLACEMENT  01/14/2014   "1"   CORONARY ARTERY BYPASS GRAFT  11/1999   CABG X5   CORONARY STENT PLACEMENT  02/2012   1 stent and balloon   ESOPHAGEAL DILATION  11/27/2018   Procedure: ESOPHAGEAL DILATION;  Surgeon: Irene Shipper, MD;  [facility];  Service: Endoscopy;;   ESOPHAGOGASTRODUODENOSCOPY N/A 03/01/2017   Procedure: ESOPHAGOGASTRODUODENOSCOPY (EGD);  Surgeon: Irene Shipper, MD;  Location: [facility];  Service: Endoscopy;  Laterality: N/A;   ESOPHAGOGASTRODUODENOSCOPY (EGD) WITH ESOPHAGEAL DILATION  2010   ESOPHAGOGASTRODUODENOSCOPY (EGD) WITH PROPOFOL N/A 09/12/2018   Procedure: ESOPHAGOGASTRODUODENOSCOPY (EGD) WITH PROPOFOL;  Surgeon: Irene Shipper, MD;  [facility];  Service: Endoscopy;  Laterality: N/A;   ESOPHAGOGASTRODUODENOSCOPY (EGD) WITH PROPOFOL N/A 11/27/2018   Procedure: ESOPHAGOGASTRODUODENOSCOPY (EGD) WITH PROPOFOL, WITH BALLOON DILATION;  Surgeon: Irene Shipper, MD;  [facility];  Service: Endoscopy;  Laterality: N/A;   ESOPHAGOGASTRODUODENOSCOPY (EGD) WITH PROPOFOL N/A 02/20/2020   Procedure: ESOPHAGOGASTRODUODENOSCOPY (EGD) WITH PROPOFOL;  Surgeon: Irene Shipper, MD;  [facility];  Service: Endoscopy;  Laterality: N/A;   HAND SURGERY     bilateral carpal tunnel releases   JOINT REPLACEMENT     KNEE ARTHROSCOPY Right 2008   LEFT AND RIGHT HEART CATHETERIZATION WITH CORONARY ANGIOGRAM  12/31/2011   Procedure: LEFT AND RIGHT HEART CATHETERIZATION WITH CORONARY ANGIOGRAM;  Surgeon: Burnell Blanks, MD;  Location[facility] C[facility] LAB;  Service: Cardiovascular;;   LEFT AND RIGHT HEART CATHETERIZATION WITH CORONARY ANGIOGRAM N/A 01/14/2014   Procedure: LEFT AND RIGHT HEART CATHETERIZATION WITH CORONARY ANGIOGRAM;  Surgeon: Peter M Martinique, MD;  Location: [facility] CATH LAB;  Service: Cardiovascular;  Laterality: N/A;   MALONEY DILATION  03/01/2017   Procedure: Venia Minks DILATION;  Surgeon: Irene Shipper, MD;  [facility];  Service: Endoscopy;;   PERCUTANEOUS CORONARY INTERVENTION-BALLOON ONLY  01/03/2012   Procedure: PERCUTANEOUS CORONARY INTERVENTION-BALLOON ONLY;  Surgeon: Peter M Martinique, MD;  Location: [facility] CATH LAB;  Service: Cardiovascular;;    PERCUTANEOUS CORONARY STENT INTERVENTION (PCI-S)  12/31/2011   Procedure: PERCUTANEOUS CORONARY STENT INTERVENTION (PCI-S);  Surgeon: Burnell Blanks, MD;  Location: [facility] CATH LAB;  Service: Cardiovascular;;   PERCUTANEOUS CORONARY STENT INTERVENTION (PCI-S) N/A 01/03/2012   Procedure: PERCUTANEOUS CORONARY STENT INTERVENTION (PCI-S);  Surgeon: Peter M Martinique, MD;  Location: [facility] CATH LAB;  Service: Cardiovascular;  Laterality: N/A;   SHOULDER ARTHROSCOPY WITH OPEN ROTATOR CUFF REPAIR AND DISTAL CLAVICLE ACROMINECTOMY Left 02/27/2013   Procedure: LEFT SHOULDER ARTHROSCOPY WITH MINI OPEN ROTATOR CUFF REPAIR AND SUBACROMIAL DECOMPRESSION AND DISTAL CLAVICLE RESECTION;  Surgeon: [name], MD;  Location: [place];  Service: Orthopedics;  Laterality: Left;   TOTAL KNEE ARTHROPLASTY Right 03/2010   [name]   TRANSURETHRAL RESECTION OF BLADDER  TUMOR WITH MITOMYCIN-C N/A 10/08/2020   Procedure: TRANSURETHRAL RESECTION OF BLADDER TUMOR WITH GEMCITABINE;  Surgeon: Robley Fries, MD;  Location: [facility];  Service: Urology;  Laterality: N/A;  75 MINS   TRIGGER FINGER RELEASE Left 02/27/2013   Procedure: RELEASE TRIGGER FINGER/A-1 PULLEY;  Surgeon: [name], MD;  Location: [place];  Service: Orthopedics;  Laterality: Left;       Family History  Problem Relation Age of Onset   COPD Mother    Heart disease Father    Heart attack Father    Stomach cancer Brother    Stroke Sister    Alcohol abuse Sister    Colon cancer Brother 25   Diabetes Brother    Rectal cancer Neg Hx     Social History   Tobacco Use   Smoking status: Former    Packs/day: 1.00    Years: 20.00    Pack years: 20.00    Types: Cigarettes    Quit date: 04/06/1963    Years since quitting: 57.8   Smokeless tobacco: Former  Scientific laboratory technician Use: Never used  Substance Use Topics   Alcohol use: No    Alcohol/week: 0.0 standard drinks    Comment: 01/01/2012 "last alcohol ~ 2 yr ago"   Drug use: No    Home  Medications Prior to Admission medications   Medication Sig Start Date End Date Taking? Authorizing Provider  cefpodoxime (VANTIN) 100 MG tablet Take 1 tablet (100 mg total) by mouth 2 (two) times daily for 7 days. 01/27/21 02/03/21 Yes Deno Etienne, DO  amLODipine (NORVASC) 5 MG tablet Take 5 mg by mouth at bedtime. 08/06/19   [provider]  aspirin EC 81 MG tablet Take 81 mg by mouth at bedtime.    [provider]  Blood Glucose Monitoring Suppl (ONE TOUCH ULTRA 2) w/Device KIT Use to obtain blood sugar daily. Dx Code E11.40 04/12/19   Viviana Simpler I, MD  carvedilol (COREG) 6.25 MG tablet Take 6.25 mg by mouth 2 (two) times daily. 11/15/19   [provider]  clopidogrel (PLAVIX) 75 MG tablet Take 1 tablet (75 mg total) by mouth daily. Patient taking differently: Take 75 mg by mouth at bedtime. 01/22/20   Burtis Junes, NP  DHA-Vitamin C-Lutein (EYE HEALTH FORMULA PO) Take 1 tablet by mouth daily.     [provider]  furosemide (LASIX) 40 MG tablet Take 1 tablet (40 mg total) by mouth every Monday, Wednesday, and Friday. 04/14/20 12/26/20  Kayleen Memos, DO  glucose blood (ONE TOUCH ULTRA TEST) test strip USE TO CHECK BLOOD SUGAR ONCE A DAY Dx Code E11.40 01/29/20   [name], MD  HYDROcodone-acetaminophen (NORCO) 10-325 MG tablet Take 1-2 tablets by mouth 2 (two) times daily as needed for severe pain. Must last 30 days. 01/09/21 02/08/21  Gillis Santa, MD  HYDROcodone-acetaminophen (NORCO) 10-325 MG tablet Take 1-2 tablets by mouth 2 (two) times daily as needed for severe pain. Must last 30 days. 02/08/21 03/10/21  Gillis Santa, MD  losartan (COZAAR) 25 MG tablet Take 25 mg by mouth every evening.    [provider]  Multiple Vitamins-Minerals (EYE MULTIVITAMIN PO) Take 1 tablet by mouth daily. Patient not taking: Reported on 12/26/2020    [provider]  nitroGLYCERIN (NITROSTAT) 0.4 MG SL tablet DISSOLVE 1 TABLET UNDER TONGUE AS  NEEDEDFOR CHEST PAIN. MAY REPEAT 5 MINUTES APART 3 TIMES IF NEEDED. IF NO RELIEF CALL 911 01/19/21  [name], MD  OneTouch Delica Lancets 84X MISC 1 each by In Vitro route daily. Dx Code E11.49 01/29/20   [name], MD  OXYGEN Inhale into the lungs. Per pt- Uses Oxygen 2 liters at night.    [provider]  pantoprazole (PROTONIX) 40 MG tablet TAKE 1 TABLET BY MOUTH TWICE (2) DAILY 07/23/20   Viviana Simpler I, MD  polyethylene glycol (MIRALAX / GLYCOLAX) 17 g packet Take 51 g by mouth every other day.     [provider]  pregabalin (LYRICA) 50 MG capsule Take 1 capsule (50 mg total) by mouth at bedtime. 12/02/20 08/29/21  Gillis Santa, MD  Semaglutide (RYBELSUS) 3 MG TABS Take 1 tablet by mouth daily. Patient not taking: Reported on 12/26/2020 07/24/20   Viviana Simpler I, MD  tamsulosin (FLOMAX) 0.4 MG CAPS capsule Take 0.4 mg by mouth at bedtime. Takes Monday, Wednesday, Friday 01/31/19   [provider]    Allergies    Doxazosin mesylate and Methocarbamol  Review of Systems   Review of Systems  Constitutional:  Positive for fever. Negative for chills.  HENT:  Negative for congestion and facial swelling.   Eyes:  Negative for discharge and visual disturbance.  Respiratory:  Positive for cough. Negative for shortness of breath.   Cardiovascular:  Negative for chest pain and palpitations.  Gastrointestinal:  Positive for abdominal pain. Negative for diarrhea and vomiting.  Musculoskeletal:  Negative for arthralgias and myalgias.  Skin:  Negative for color change and rash.  Neurological:  Negative for tremors, syncope and headaches.  Psychiatric/Behavioral:  Negative for confusion and dysphoric mood.    Physical Exam Updated Vital Signs BP (!) 141/57   Pulse 61   Temp 98.4 F (36.9 C)   Resp (!) 22   SpO2 92%   Physical Exam Vitals and nursing note reviewed.  Constitutional:      Appearance: He is well-developed.  HENT:     Head:  Normocephalic and atraumatic.  Eyes:     Pupils: Pupils are equal, round, and reactive to light.  Neck:     Vascular: No JVD.  Cardiovascular:     Rate and Rhythm: Normal rate and regular rhythm.     Heart sounds: No murmur heard.   No friction rub. No gallop.  Pulmonary:     Effort: No respiratory distress.     Breath sounds: No wheezing.     Comments: Tachypnea  Abdominal:     General: There is no distension.     Tenderness: There is abdominal tenderness. There is no guarding or rebound.     Comments: Mild tenderness over the suprapubic region  Musculoskeletal:        General: Normal range of motion.     Cervical back: Normal range of motion and neck supple.  Skin:    Coloration: Skin is not pale.     Findings: No rash.  Neurological:     Mental Status: He is alert and oriented to person, place, and time.  Psychiatric:        Behavior: Behavior normal.    ED Results / Procedures / Treatments   Labs (all labs ordered are listed, but only abnormal results are displayed) Labs Reviewed  COMPREHENSIVE METABOLIC PANEL - Abnormal; Notable for the following components:      Result Value   Glucose, Bld 236 (*)    BUN 35 (*)    Creatinine, Ser 2.19 (*)    Albumin 3.3 (*)  GFR, Estimated 28 (*)    All other components within normal limits  CBC WITH DIFFERENTIAL/PLATELET - Abnormal; Notable for the following components:   WBC 18.7 (*)    Hemoglobin 12.4 (*)    Neutro Abs 16.9 (*)    Abs Immature Granulocytes 0.12 (*)    All other components within normal limits  URINALYSIS, ROUTINE W REFLEX MICROSCOPIC - Abnormal; Notable for the following components:   APPearance CLOUDY (*)    Glucose, UA 50 (*)    Hgb urine dipstick MODERATE (*)    Ketones, ur 5 (*)    Protein, ur 100 (*)    Leukocytes,Ua LARGE (*)    WBC, UA >50 (*)    Bacteria, UA FEW (*)    Non Squamous Epithelial 0-5 (*)    All other components within normal limits  RESP PANEL BY RT-PCR (FLU A&B, COVID) [id number]   CULTURE, BLOOD (ROUTINE X 2)  CULTURE, BLOOD (ROUTINE X 2)  URINE CULTURE  LACTIC ACID, PLASMA  PROTIME-INR  APTT  LIPASE, BLOOD  LACTIC ACID, PLASMA    EKG EKG Interpretation  Date/Time:  Tuesday January 27 2021 03:49:57 EDT Ventricular Rate:  76 PR Interval:  146 QRS Duration: 98 QT Interval:  378 QTC Calculation: 425 R Axis:   -22 Text Interpretation: Normal sinus rhythm Normal ECG No significant change since last tracing Confirmed by Deno Etienne [phone]) on 01/27/2021 4:00:55 AM  Radiology CT ABDOMEN PELVIS WO CONTRAST  Result Date: 01/27/2021 CLINICAL DATA:  Fever and weakness. Bladder cancer with recent chemotherapy EXAM: CT ABDOMEN AND PELVIS WITHOUT CONTRAST TECHNIQUE: Multidetector CT imaging of the abdomen and pelvis was performed following the standard protocol without IV contrast. COMPARISON:  09/02/2020 FINDINGS: Lower chest: Partly calcified pleural thickening in the right posterior chest. Coronary atherosclerosis Hepatobiliary: No focal liver abnormality.Cholecystectomy. No bile duct dilatation. Pancreas: Unremarkable. Spleen: Unremarkable. Adrenals/Urinary Tract: Negative adrenals. No hydronephrosis or ureteral stone. Generalized cortical atrophy. Left upper pole renal cyst measuring 4.8 cm. Thick walled bladder with bubbles of gas superiorly where there is implied bladder wall thinning. Masslike appearance of the upper bladder is improved. Punctate bilateral renal calculi. No perforation or visible intraluminal hemorrhage. Stomach/Bowel:  No obstruction. No visible inflammation. Vascular/Lymphatic: No acute vascular abnormality. No mass or adenopathy. Reproductive:Enlarged prostate up lifting the bladder base. Other: No ascites or pneumoperitoneum. Bilateral inguinal hernia, particularly large on the right where there are numerous internal bowel loops. Musculoskeletal: No acute abnormalities. Remote T11 compression fracture. IMPRESSION: 1. No acute finding. 2. Improved  bladder mass. 3. Chronic inguinal hernias, very large on the right with multiple internal bowel loops. Electronically Signed   By: Jorje Guild M.D.   On: 01/27/2021 05:14   DG Chest Port 1 View  Result Date: 01/27/2021 CLINICAL DATA:  87 year old male with possible sepsis. EXAM: PORTABLE CHEST 1 VIEW COMPARISON:  Chest x-ray 04/10/2020. FINDINGS: Lung volumes are low. No consolidative airspace disease. No pleural effusions. No pneumothorax. No pulmonary nodule or mass noted. Pulmonary vasculature and the cardiomediastinal silhouette are within normal limits. Atherosclerotic calcifications in the thoracic aorta. Status post median sternotomy for CABG. IMPRESSION: 1. Low lung volumes without radiographic evidence of acute cardiopulmonary disease. 2. Aortic atherosclerosis. Electronically Signed   By: Vinnie Langton M.D.   On: 01/27/2021 05:10    Procedures Procedures   Medications Ordered in ED Medications  lactated ringers bolus 1,000 mL (1,000 mLs Intravenous New Bag/Given 01/27/21 0431)  cefTRIAXone (ROCEPHIN) 2 g in sodium chloride 0.9 % 100 mL IVPB (  0 g Intravenous Stopped 01/27/21 0553)    ED Course  I have reviewed the triage vital signs and the nursing notes.  Pertinent labs & imaging results that were available during my care of the patient were reviewed by me and considered in my medical decision making (see chart for details).    MDM Rules/Calculators/A&P                           87 yo M with a chief complaints of fever.  Going on for about 48 hours now.  No known sick contacts.  No rashes.  Complains of some mild diffuse abdominal discomfort.  Patient appears somewhat uncomfortable on initial exam.  Will obtain a laboratory evaluation bolus of IV fluids chest x-ray UA CT abdomen pelvis reassess.  Just had Tylenol prior to arrival.  COVID-negative.  Significant leukocytosis of 18,000.  Chest x-ray viewed by me without focal infiltrate.  CT abdomen pelvis without obvious  cause of the patient's fever.  UA without obvious infection too numerous to count whites and rare bacteria.  With him having some urinary symptoms we will start him on antibiotics.  Have him follow-up with his urologist and his family doctor.  6:01 AM:  I have discussed the diagnosis/risks/treatment options with the patient and believe the pt to be eligible for discharge home to follow-up with PCP, Urology. We also discussed returning to the ED immediately if new or worsening sx occur. We discussed the sx which are most concerning (e.g., sudden worsening pain, fever, inability to tolerate by mouth) that necessitate immediate return. Medications administered to the patient during their visit and any new prescriptions provided to the patient are listed below.  Medications given during this visit Medications  lactated ringers bolus 1,000 mL (1,000 mLs Intravenous New Bag/Given 01/27/21 0431)  cefTRIAXone (ROCEPHIN) 2 g in sodium chloride 0.9 % 100 mL IVPB (0 g Intravenous Stopped 01/27/21 0553)     The patient appears reasonably screen and/or stabilized for discharge and I doubt any other medical condition or other [employer] requiring further screening, evaluation, or treatment in the ED at this time prior to discharge.   Final Clinical Impression(s) / ED Diagnoses Final diagnoses:  Lower urinary tract infectious disease    Rx / DC Orders ED Discharge Orders          Ordered    cefpodoxime (VANTIN) 100 MG tablet  2 times daily        01/27/21 0559             Deno Etienne, DO 01/27/21 0601

## 2021-01-27 NOTE — ED Provider Notes (Signed)
Emergency Medicine Provider Triage Evaluation Note  [name] , a 87 y.o. male  was evaluated in triage.  Pt complains of possible sepsis.  Fever started approx 8 hours ago, temporal 101.18F with EMS.  Took 1g tylenol prior to EMS.  Hx bladder cancer, last chemo was 01/19/21.  States he feels weak.  Has been tachypneic with EMS.  Denies recent sick contacts.  Denies N/V/D.  Review of Systems  Positive: Fever, sepsis, weak Negative: vomiting  Physical Exam  BP (!) 136/59 (BP Location: Left Arm)   Pulse 74   Temp 98.5 F (36.9 C) (Oral)   Resp (!) 26   SpO2 97%  Gen:   Awake, no distress   Resp:  Normal effort  MSK:   Moves extremities without difficulty  Other:    Medical Decision Making  Medically screening exam initiated at 3:49 AM.  Appropriate orders placed.  KALEP FULL was informed that the remainder of the evaluation will be completed by another provider, this initial triage assessment does not replace that evaluation, and the importance of remaining in the ED until their evaluation is complete.  Fever in cancer patient receiving active chemotherapy treatment.  Sepsis work-up initiated.  Charge RN aware, moving to acute bed now.   Larene Pickett, PA-C 01/27/21 [place], [place], DO 01/27/21 [phone]

## 2021-01-28 ENCOUNTER — Emergency Department (HOSPITAL_COMMUNITY): Payer: Medicare Other

## 2021-01-28 ENCOUNTER — Inpatient Hospital Stay (HOSPITAL_COMMUNITY)
Admission: EM | Admit: 2021-01-28 | Discharge: 2021-01-30 | DRG: 872 | Disposition: A | Discharge status: Home / Self Care | Payer: Medicare Other | Attending: Internal Medicine | Admitting: Internal Medicine

## 2021-01-28 ENCOUNTER — Other Ambulatory Visit: Payer: Self-pay

## 2021-01-28 ENCOUNTER — Encounter (HOSPITAL_COMMUNITY): Payer: Self-pay | Admitting: *Deleted

## 2021-01-28 DIAGNOSIS — A419 Sepsis, unspecified organism: Secondary | ICD-10-CM | POA: Diagnosis present

## 2021-01-28 DIAGNOSIS — I130 Hypertensive heart and chronic kidney disease with heart failure and stage 1 through stage 4 chronic kidney disease, or unspecified chronic kidney disease: Secondary | ICD-10-CM | POA: Diagnosis present

## 2021-01-28 DIAGNOSIS — J449 Chronic obstructive pulmonary disease, unspecified: Secondary | ICD-10-CM | POA: Diagnosis present

## 2021-01-28 DIAGNOSIS — N184 Chronic kidney disease, stage 4 (severe): Secondary | ICD-10-CM | POA: Diagnosis present

## 2021-01-28 DIAGNOSIS — E1142 Type 2 diabetes mellitus with diabetic polyneuropathy: Secondary | ICD-10-CM | POA: Diagnosis present

## 2021-01-28 DIAGNOSIS — E1165 Type 2 diabetes mellitus with hyperglycemia: Secondary | ICD-10-CM | POA: Diagnosis present

## 2021-01-28 DIAGNOSIS — Z8719 Personal history of other diseases of the digestive system: Secondary | ICD-10-CM

## 2021-01-28 DIAGNOSIS — Z20822 Contact with and (suspected) exposure to covid-19: Secondary | ICD-10-CM | POA: Diagnosis present

## 2021-01-28 DIAGNOSIS — G894 Chronic pain syndrome: Secondary | ICD-10-CM | POA: Diagnosis present

## 2021-01-28 DIAGNOSIS — Z7902 Long term (current) use of antithrombotics/antiplatelets: Secondary | ICD-10-CM

## 2021-01-28 DIAGNOSIS — Z888 Allergy status to other drugs, medicaments and biological substances status: Secondary | ICD-10-CM

## 2021-01-28 DIAGNOSIS — Z955 Presence of coronary angioplasty implant and graft: Secondary | ICD-10-CM

## 2021-01-28 DIAGNOSIS — R7881 Bacteremia: Secondary | ICD-10-CM | POA: Diagnosis present

## 2021-01-28 DIAGNOSIS — Z951 Presence of aortocoronary bypass graft: Secondary | ICD-10-CM

## 2021-01-28 DIAGNOSIS — Z833 Family history of diabetes mellitus: Secondary | ICD-10-CM

## 2021-01-28 DIAGNOSIS — Z9981 Dependence on supplemental oxygen: Secondary | ICD-10-CM

## 2021-01-28 DIAGNOSIS — I10 Essential (primary) hypertension: Secondary | ICD-10-CM | POA: Diagnosis present

## 2021-01-28 DIAGNOSIS — G4733 Obstructive sleep apnea (adult) (pediatric): Secondary | ICD-10-CM | POA: Diagnosis present

## 2021-01-28 DIAGNOSIS — K219 Gastro-esophageal reflux disease without esophagitis: Secondary | ICD-10-CM | POA: Diagnosis present

## 2021-01-28 DIAGNOSIS — I5032 Chronic diastolic (congestive) heart failure: Secondary | ICD-10-CM | POA: Diagnosis present

## 2021-01-28 DIAGNOSIS — Z825 Family history of asthma and other chronic lower respiratory diseases: Secondary | ICD-10-CM

## 2021-01-28 DIAGNOSIS — R319 Hematuria, unspecified: Secondary | ICD-10-CM

## 2021-01-28 DIAGNOSIS — I5030 Unspecified diastolic (congestive) heart failure: Secondary | ICD-10-CM | POA: Diagnosis present

## 2021-01-28 DIAGNOSIS — Z823 Family history of stroke: Secondary | ICD-10-CM

## 2021-01-28 DIAGNOSIS — H353290 Exudative age-related macular degeneration, unspecified eye, stage unspecified: Secondary | ICD-10-CM | POA: Diagnosis present

## 2021-01-28 DIAGNOSIS — M059 Rheumatoid arthritis with rheumatoid factor, unspecified: Secondary | ICD-10-CM | POA: Diagnosis present

## 2021-01-28 DIAGNOSIS — E1122 Type 2 diabetes mellitus with diabetic chronic kidney disease: Secondary | ICD-10-CM | POA: Diagnosis present

## 2021-01-28 DIAGNOSIS — I252 Old myocardial infarction: Secondary | ICD-10-CM

## 2021-01-28 DIAGNOSIS — J9611 Chronic respiratory failure with hypoxia: Secondary | ICD-10-CM | POA: Diagnosis present

## 2021-01-28 DIAGNOSIS — J849 Interstitial pulmonary disease, unspecified: Secondary | ICD-10-CM | POA: Diagnosis present

## 2021-01-28 DIAGNOSIS — I2510 Atherosclerotic heart disease of native coronary artery without angina pectoris: Secondary | ICD-10-CM | POA: Diagnosis present

## 2021-01-28 DIAGNOSIS — C679 Malignant neoplasm of bladder, unspecified: Secondary | ICD-10-CM | POA: Diagnosis present

## 2021-01-28 DIAGNOSIS — Z800 Family history of malignant neoplasm of digestive organs: Secondary | ICD-10-CM

## 2021-01-28 DIAGNOSIS — N390 Urinary tract infection, site not specified: Secondary | ICD-10-CM | POA: Diagnosis present

## 2021-01-28 DIAGNOSIS — H9190 Unspecified hearing loss, unspecified ear: Secondary | ICD-10-CM | POA: Diagnosis present

## 2021-01-28 DIAGNOSIS — E785 Hyperlipidemia, unspecified: Secondary | ICD-10-CM | POA: Diagnosis present

## 2021-01-28 DIAGNOSIS — Z7982 Long term (current) use of aspirin: Secondary | ICD-10-CM

## 2021-01-28 DIAGNOSIS — E1149 Type 2 diabetes mellitus with other diabetic neurological complication: Secondary | ICD-10-CM | POA: Diagnosis present

## 2021-01-28 DIAGNOSIS — I5022 Chronic systolic (congestive) heart failure: Secondary | ICD-10-CM | POA: Diagnosis present

## 2021-01-28 DIAGNOSIS — E872 Acidosis, unspecified: Secondary | ICD-10-CM | POA: Diagnosis present

## 2021-01-28 DIAGNOSIS — Z87442 Personal history of urinary calculi: Secondary | ICD-10-CM

## 2021-01-28 DIAGNOSIS — M545 Low back pain, unspecified: Secondary | ICD-10-CM | POA: Diagnosis present

## 2021-01-28 DIAGNOSIS — M069 Rheumatoid arthritis, unspecified: Secondary | ICD-10-CM | POA: Diagnosis present

## 2021-01-28 DIAGNOSIS — Z8249 Family history of ischemic heart disease and other diseases of the circulatory system: Secondary | ICD-10-CM

## 2021-01-28 DIAGNOSIS — A411 Sepsis due to other specified staphylococcus: Principal | ICD-10-CM | POA: Diagnosis present

## 2021-01-28 DIAGNOSIS — Z87891 Personal history of nicotine dependence: Secondary | ICD-10-CM

## 2021-01-28 DIAGNOSIS — Z79899 Other long term (current) drug therapy: Secondary | ICD-10-CM

## 2021-01-28 DIAGNOSIS — Z66 Do not resuscitate: Secondary | ICD-10-CM | POA: Diagnosis present

## 2021-01-28 LAB — URINALYSIS, ROUTINE W REFLEX MICROSCOPIC
Bilirubin Urine: NEGATIVE
Glucose, UA: 50 mg/dL — AB
Ketones, ur: NEGATIVE mg/dL
Nitrite: NEGATIVE
Protein, ur: 100 mg/dL — AB
RBC / HPF: >50 RBC/hpf — ABNORMAL HIGH (ref 0–5)
Specific Gravity, Urine: 1.016 (ref 1.005–1.030)
WBC, UA: >50 WBC/hpf — ABNORMAL HIGH (ref 0–5)
pH: 5 (ref 5.0–8.0)

## 2021-01-28 LAB — CBC WITH DIFFERENTIAL/PLATELET
Abs Immature Granulocytes: 0.05 10*3/uL (ref 0.00–0.07)
Basophils Absolute: 0.1 10*3/uL (ref 0.0–0.1)
Basophils Relative: 1 %
Eosinophils Absolute: 0.5 10*3/uL (ref 0.0–0.5)
Eosinophils Relative: 5 %
HCT: 38.5 % — ABNORMAL LOW (ref 39.0–52.0)
Hemoglobin: 12.2 g/dL — ABNORMAL LOW (ref 13.0–17.0)
Immature Granulocytes: 1 %
Lymphocytes Relative: 13 %
Lymphs Abs: 1.3 10*3/uL (ref 0.7–4.0)
MCH: 27 pg (ref 26.0–34.0)
MCHC: 31.7 g/dL (ref 30.0–36.0)
MCV: 85.2 fL (ref 80.0–100.0)
Monocytes Absolute: 0.6 10*3/uL (ref 0.1–1.0)
Monocytes Relative: 6 %
Neutro Abs: 7.5 10*3/uL (ref 1.7–7.7)
Neutrophils Relative %: 74 %
Platelets: 220 10*3/uL (ref 150–400)
RBC: 4.52 MIL/uL (ref 4.22–5.81)
RDW: 14.7 % (ref 11.5–15.5)
WBC: 9.9 10*3/uL (ref 4.0–10.5)
nRBC: 0 % (ref 0.0–0.2)

## 2021-01-28 LAB — COMPREHENSIVE METABOLIC PANEL
ALT: 24 U/L (ref 0–44)
AST: 28 U/L (ref 15–41)
Albumin: 3.2 g/dL — ABNORMAL LOW (ref 3.5–5.0)
Alkaline Phosphatase: 72 U/L (ref 38–126)
Anion gap: 10 (ref 5–15)
BUN: 32 mg/dL — ABNORMAL HIGH (ref 8–23)
CO2: 24 mmol/L (ref 22–32)
Calcium: 9.6 mg/dL (ref 8.9–10.3)
Chloride: 103 mmol/L (ref 98–111)
Creatinine, Ser: 1.99 mg/dL — ABNORMAL HIGH (ref 0.61–1.24)
GFR, Estimated: 32 mL/min — ABNORMAL LOW (ref >60)
Glucose, Bld: 315 mg/dL — ABNORMAL HIGH (ref 70–99)
Potassium: 4.3 mmol/L (ref 3.5–5.1)
Sodium: 137 mmol/L (ref 135–145)
Total Bilirubin: 0.9 mg/dL (ref 0.3–1.2)
Total Protein: 7.9 g/dL (ref 6.5–8.1)

## 2021-01-28 LAB — LACTIC ACID, PLASMA
Lactic Acid, Venous: 2.1 mmol/L (ref 0.5–1.9)
Lactic Acid, Venous: 1.6 mmol/L (ref 0.5–1.9)

## 2021-01-28 LAB — CBG MONITORING, ED
Glucose-Capillary: 175 mg/dL — ABNORMAL HIGH (ref 70–99)
Glucose-Capillary: 164 mg/dL — ABNORMAL HIGH (ref 70–99)
Glucose-Capillary: 136 mg/dL — ABNORMAL HIGH (ref 70–99)

## 2021-01-28 LAB — APTT: aPTT: 30 seconds (ref 24–36)

## 2021-01-28 LAB — CULTURE, BLOOD (ROUTINE X 2): Special Requests: ADEQUATE

## 2021-01-28 LAB — URINE CULTURE: Culture: >=100000 — AB

## 2021-01-28 LAB — PROTIME-INR
INR: 1.1 (ref 0.8–1.2)
Prothrombin Time: 13.7 seconds (ref 11.4–15.2)

## 2021-01-28 MED ORDER — LOSARTAN POTASSIUM 25 MG PO TABS
25.0000 mg | ORAL_TABLET | Freq: Every evening | ORAL | Status: DC
  Administered 2021-01-28 – 2021-01-29 (×2): 25 mg via ORAL
  Filled 2021-01-28 (×2): qty 1

## 2021-01-28 MED ORDER — HYDROCODONE-ACETAMINOPHEN 10-325 MG PO TABS
1.0000 | ORAL_TABLET | Freq: Two times a day (BID) | ORAL | Status: DC | PRN
  Administered 2021-01-29: 2 via ORAL
  Filled 2021-01-28: qty 2

## 2021-01-28 MED ORDER — ONDANSETRON HCL 4 MG PO TABS: 4.0000 mg | ORAL_TABLET | Freq: Four times a day (QID) | ORAL | Status: DC | PRN

## 2021-01-28 MED ORDER — ONDANSETRON HCL 4 MG/2ML IJ SOLN: 4.0000 mg | Freq: Four times a day (QID) | INTRAMUSCULAR | Status: DC | PRN

## 2021-01-28 MED ORDER — SODIUM CHLORIDE 0.9 % IV SOLN
2.0000 g | INTRAVENOUS | Status: DC
  Administered 2021-01-29: 2 g via INTRAVENOUS
  Filled 2021-01-28: qty 20

## 2021-01-28 MED ORDER — ASPIRIN EC 81 MG PO TBEC
81.0000 mg | DELAYED_RELEASE_TABLET | Freq: Every day | ORAL | Status: DC
  Administered 2021-01-28 – 2021-01-29 (×2): 81 mg via ORAL
  Filled 2021-01-28 (×2): qty 1

## 2021-01-28 MED ORDER — SODIUM CHLORIDE 0.9 % IV BOLUS
500.0000 mL | Freq: Once | INTRAVENOUS | Status: AC
  Administered 2021-01-28: 500 mL via INTRAVENOUS

## 2021-01-28 MED ORDER — NITROGLYCERIN 0.4 MG SL SUBL: 0.4000 mg | SUBLINGUAL_TABLET | SUBLINGUAL | Status: DC | PRN

## 2021-01-28 MED ORDER — PREGABALIN 25 MG PO CAPS
50.0000 mg | ORAL_CAPSULE | Freq: Every day | ORAL | Status: DC
  Administered 2021-01-28 – 2021-01-29 (×2): 50 mg via ORAL
  Filled 2021-01-28 (×2): qty 2

## 2021-01-28 MED ORDER — CLOPIDOGREL BISULFATE 75 MG PO TABS
75.0000 mg | ORAL_TABLET | Freq: Every day | ORAL | Status: DC
  Administered 2021-01-28 – 2021-01-29 (×2): 75 mg via ORAL
  Filled 2021-01-28 (×2): qty 1

## 2021-01-28 MED ORDER — CARVEDILOL 6.25 MG PO TABS
6.2500 mg | ORAL_TABLET | Freq: Two times a day (BID) | ORAL | Status: DC
  Administered 2021-01-28 – 2021-01-30 (×4): 6.25 mg via ORAL
  Filled 2021-01-28 (×2): qty 2
  Filled 2021-01-28: qty 1
  Filled 2021-01-28: qty 2
  Filled 2021-01-28: qty 1

## 2021-01-28 MED ORDER — SODIUM CHLORIDE 0.9 % IV SOLN: 250.0000 mL | INTRAVENOUS | Status: DC | PRN

## 2021-01-28 MED ORDER — ACETAMINOPHEN 325 MG PO TABS: 650.0000 mg | ORAL_TABLET | Freq: Four times a day (QID) | ORAL | Status: DC | PRN

## 2021-01-28 MED ORDER — SODIUM CHLORIDE 0.9 % IV SOLN
2.0000 g | Freq: Once | INTRAVENOUS | Status: AC
  Administered 2021-01-28: 2 g via INTRAVENOUS
  Filled 2021-01-28: qty 20

## 2021-01-28 MED ORDER — FUROSEMIDE 40 MG PO TABS
40.0000 mg | ORAL_TABLET | ORAL | Status: DC
  Administered 2021-01-28 – 2021-01-30 (×2): 40 mg via ORAL
  Filled 2021-01-28: qty 1
  Filled 2021-01-28: qty 2

## 2021-01-28 MED ORDER — PANTOPRAZOLE SODIUM 40 MG PO TBEC
40.0000 mg | DELAYED_RELEASE_TABLET | Freq: Two times a day (BID) | ORAL | Status: DC
  Administered 2021-01-28 – 2021-01-30 (×4): 40 mg via ORAL
  Filled 2021-01-28 (×4): qty 1

## 2021-01-28 MED ORDER — VANCOMYCIN HCL 1500 MG/300ML IV SOLN
1500.0000 mg | Freq: Once | INTRAVENOUS | Status: AC
  Administered 2021-01-28: 1500 mg via INTRAVENOUS
  Filled 2021-01-28: qty 300

## 2021-01-28 MED ORDER — VANCOMYCIN HCL 1250 MG/250ML IV SOLN: 1250.0000 mg | INTRAVENOUS | Status: DC

## 2021-01-28 MED ORDER — ENOXAPARIN SODIUM 30 MG/0.3ML IJ SOSY
30.0000 mg | PREFILLED_SYRINGE | INTRAMUSCULAR | Status: DC
  Administered 2021-01-28 – 2021-01-29 (×2): 30 mg via SUBCUTANEOUS
  Filled 2021-01-28 (×2): qty 0.3

## 2021-01-28 MED ORDER — SODIUM CHLORIDE 0.9% FLUSH
3.0000 mL | Freq: Two times a day (BID) | INTRAVENOUS | Status: DC
  Administered 2021-01-28 – 2021-01-30 (×5): 3 mL via INTRAVENOUS

## 2021-01-28 MED ORDER — INSULIN ASPART 100 UNIT/ML IJ SOLN
0.0000 [IU] | Freq: Three times a day (TID) | INTRAMUSCULAR | Status: DC
  Administered 2021-01-28 – 2021-01-29 (×2): 2 [IU] via SUBCUTANEOUS
  Administered 2021-01-29: 3 [IU] via SUBCUTANEOUS
  Administered 2021-01-30: 2 [IU] via SUBCUTANEOUS

## 2021-01-28 MED ORDER — SODIUM CHLORIDE 0.9% FLUSH: 3.0000 mL | INTRAVENOUS | Status: DC | PRN

## 2021-01-28 MED ORDER — AMLODIPINE BESYLATE 5 MG PO TABS
5.0000 mg | ORAL_TABLET | Freq: Every day | ORAL | Status: DC
  Administered 2021-01-28 – 2021-01-29 (×2): 5 mg via ORAL
  Filled 2021-01-28 (×3): qty 1

## 2021-01-28 MED ORDER — TAMSULOSIN HCL 0.4 MG PO CAPS
0.4000 mg | ORAL_CAPSULE | Freq: Every day | ORAL | Status: DC
  Administered 2021-01-28 – 2021-01-29 (×2): 0.4 mg via ORAL
  Filled 2021-01-28 (×2): qty 1

## 2021-01-28 MED ORDER — ACETAMINOPHEN 650 MG RE SUPP: 650.0000 mg | Freq: Four times a day (QID) | RECTAL | Status: DC | PRN

## 2021-01-28 NOTE — ED Provider Notes (Signed)
[facility] EMERGENCY DEPARTMENT Provider Note   CSN: [id number] Arrival date & time: 01/28/21  0944     History Chief Complaint  Patient presents with   Abnormal Lab    [name] is a 87 y.o. male.  HPI  87 year old male history of coronary artery disease, COPD, CKD, receiving intra bladder therapy for cancer was last treatment on Monday.  Yesterday, Tuesday, patient reports that he had fever to 104.  He reports some associated dyspnea, body aches, and generalized weakness.  He reports some burning with urination.  He denies nausea, vomiting, diarrhea, headache, head injury, cough, chest pain, skin lesions.  He was seen yesterday and had blood cultures obtained.  He received Rocephin in the ED.  He was called back due to positive blood cultures with staph hominis.  Urine grew out mixed species.  1 blood culture has become positive and second is still pending.  No sensitivities are reported at this time. Your records from yesterday revealed a white blood cell count of 18,700, lactic acid 1.2.  Previous urine grew out staph epi, oxacillin resistant October 29, 2020.  Patient treated at that time.  No urinalysis reported in system since that time.    Past Medical History:  Diagnosis Date   Arthritis    osteoarthritis, s/p R TKR, and digits   CAD (coronary artery disease)    a. s/p CABG (2001)  b. s/p DES to RCA and cutting POBA to ostial PDA (2013)   c. s/p DES to SVG to OM2 (01/14/14) d. cath: 08/2015 NSTEMI w/ patent LIMA-LAD and 99% stenosis of SVG-OM w/ DES placed. CTO of SVG-RCA and SVG-D1.    Cataract    Chronic diastolic CHF (congestive heart failure) ([name])    a) 09/13 ECHO- LVEF 44-01%, grade 1 diastolic dysfunction, mild LA dilatation, atrial septal aneurysm, AV mobility restricted, but no sig AS by doppler; b) 09/04/08 ECHO- LVH, ef 60%, mild AS, c. echo 08/2015: EF perserved of 55-60% with inferolateral HK. Mild AS noted.   Chronic kidney disease, stage III  (moderate) ([name])    Chronic lower back pain    Colon polyps    COVID-19    Diverticulosis    Dyspnea 2009 since July -Sept   05/06/08-CPST-  normal effort, reduced VO2 max 20.5 /65%, reduced at 8.2/ 40%, normal breathing resetvca of 55%, submaximal heart rate response 112/77%, flattened o2 pluse response at peak exercise-12 ml/beat @ 85%, No VQ mismatch abnormalities, All c/w CIRC Limitation   Enlarged prostate    Esophageal stricture    a. s/p dilation spring 2010   GERD (gastroesophageal reflux disease)    Heart murmur    Hiatal hernia    History of carpal tunnel syndrome    Bilateral   History of kidney stones    History of PFTs    mixed pattern on spiro. mild restn on lung volumes with near normal DLCO. Pattern can be explained by CABG scar. Fev1 2.2L/73%, ratio 68 (67), TLC 4.7/68%,RV 1.5L/55%,DLCO 79%   Hyperlipidemia    Hypertension    Interstitial lung disease ([name])    NOS   Iron deficiency anemia    Nausea & vomiting    2018/2019   On home oxygen therapy    2 L [place] at bedtime   Osteoporosis    Overweight (BMI 25.0-29.9)    BMI 29   Peripheral neuropathy    RA (rheumatoid arthritis) ([name])    [name]   Seropositive rheumatoid arthritis (  [place])    Type II diabetes mellitus ([place])    diet controlled   Wears glasses    Wears partial dentures    upper    Patient Active Problem List   Diagnosis Date Noted   Cystitis 10/29/2020   Weakness 10/29/2020   Bladder mass 08/26/2020   Macular degeneration, wet ([place]) 06/18/2020   Pain due to onychomycosis of toenails of both feet 05/22/2020   Ventricular tachycardia 04/23/2020   Chronic narcotic dependence ([place]) 12/14/2019   Plantar flexed metatarsal bone of left foot 11/08/2019   Long term prescription opiate use 12/14/2018   Neuropathy 12/14/2018   Problems with swallowing and mastication    Achalasia    Right inguinal hernia 07/04/2018   Advance directive discussed with patient 04/28/2018   Chronic pain of both  lower extremities 10/26/2017   Neuropathic pain 10/26/2017   Chronic pain syndrome 10/26/2017   Iron deficiency anemia 07/05/2017   Constipation 07/05/2017   CKD (chronic kidney disease) stage 4, GFR 15-29 ml/min ([place]) 05/17/2017   Seropositive rheumatoid arthritis ([place]) 05/17/2017   DM (diabetes mellitus) type II controlled, neurological manifestation ([place]) 05/17/2017   Atherosclerotic heart disease of native coronary artery with angina pectoris ([place]) 05/17/2017   Dysphagia 05/17/2017   High risk medication use 10/21/2016   Primary osteoarthritis of both knees 10/21/2016   History of right knee joint replacement 10/21/2016   Orthostatic hypotension 10/01/2016   Spinal stenosis of lumbar region without neurogenic claudication 03/23/2016   Lumbar facet arthropathy 03/23/2016   Respiratory failure, chronic ([place]) 11/21/2014   Rheumatoid arthritis ([place]) 11/05/2014   Rheumatic fever without heart involvement 03/04/2014   Ventral hernia 12/17/2013   Routine general medical examination at a health care facility 08/29/2012   ILD (interstitial lung disease) ([name]) 11/28/2011   Chronic diastolic heart failure ([place]) 09/14/2011   Nausea 02/16/2011   Diabetic polyneuropathy associated with type 2 diabetes mellitus ([place]) 08/10/2010   Type 2 diabetes mellitus with diabetic polyneuropathy ([place]) 08/10/2010   Obstructive sleep apnea 12/17/2008   ESOPHAGEAL STRICTURE 10/09/2008   Esophageal stricture 10/09/2008   Reflux esophagitis 09/10/2008   Diverticulosis of colon 09/10/2008   Gastro-esophageal reflux disease with esophagitis 09/10/2008   Coronary atherosclerosis 03/19/2008   Aortic valve disorder 03/19/2008   Thoracic aorta atherosclerosis ([place]) 03/19/2008   SLEEP DISORDER, CHRONIC 10/17/2006   Disturbance in sleep behavior 10/17/2006   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   Essential hypertension 09/23/2006   GERD 09/23/2006   BENIGN PROSTATIC HYPERTROPHY 09/23/2006    Enlarged prostate without lower urinary tract symptoms (luts) 09/23/2006   HLD (hyperlipidemia) 09/23/2006    Past Surgical History:  Procedure Laterality Date   BALLOON DILATION N/A 09/12/2018   Procedure: BALLOON DILATION;  Surgeon: Irene Shipper, MD;  Location: [facility];  Service: Endoscopy;  Laterality: N/A;   BALLOON DILATION N/A 02/20/2020   Procedure: BALLOON DILATION;  Surgeon: Irene Shipper, MD;  Location: [facility];  Service: Endoscopy;  Laterality: N/A;   BOTOX INJECTION N/A 09/12/2018   Procedure: BOTOX INJECTION;  Surgeon: Irene Shipper, MD;  Location: [facility];  Service: Endoscopy;  Laterality: N/A;   BOTOX INJECTION N/A 11/27/2018   Procedure: BOTOX INJECTION;  Surgeon: Irene Shipper, MD;  Location: [facility];  Service: Endoscopy;  Laterality: N/A;   BOTOX INJECTION N/A 02/20/2020   Procedure: BOTOX INJECTION;  Surgeon: Irene Shipper, MD;  Location: [facility];  Service: Endoscopy;  Laterality: N/A;   CARDIAC CATHETERIZATION  08/2004   CP- no  MI, Cath- small vessell disease    CARDIAC CATHETERIZATION  12/31/2011   80% distal LM, 100% native LAD, LCx and RCA, 30% prox SVG-OM, SVG-D1 normal, 99% distal, 80% ostial SVG-RCA distal to graft, LIMA-LAD normal; LVEF mildly decreased with posterior basal AK    CARDIAC CATHETERIZATION  2009   with patent grafts/notes 12/31/2011   CARDIAC CATHETERIZATION N/A 08/13/2015   Procedure: Left Heart Cath and Cors/Grafts Angiography;  Surgeon: Sherren Mocha, MD;  Location: [place] [facility];  Service: Cardiovascular;  Laterality: N/A;   CATARACT EXTRACTION W/ INTRAOCULAR LENS  IMPLANT, BILATERAL Bilateral    CHOLECYSTECTOMY OPEN  11/2003   [name]   CORONARY ANGIOPLASTY WITH STENT PLACEMENT  01/03/2012   Successful DES to SVG-RCA and cutting balloon angioplasty ostial  PDA    CORONARY ANGIOPLASTY WITH STENT PLACEMENT  01/14/2014   "1"   CORONARY ARTERY BYPASS GRAFT  11/1999   CABG X5   CORONARY STENT PLACEMENT  02/2012   1  stent and balloon   ESOPHAGEAL DILATION  11/27/2018   Procedure: ESOPHAGEAL DILATION;  Surgeon: Irene Shipper, MD;  [facility]: [facility];  Service: Endoscopy;;   ESOPHAGOGASTRODUODENOSCOPY N/A 03/01/2017   Procedure: ESOPHAGOGASTRODUODENOSCOPY (EGD);  Surgeon: Irene Shipper, MD;  Location: [facility];  Service: Endoscopy;  Laterality: N/A;   ESOPHAGOGASTRODUODENOSCOPY (EGD) WITH ESOPHAGEAL DILATION  2010   ESOPHAGOGASTRODUODENOSCOPY (EGD) WITH PROPOFOL N/A 09/12/2018   Procedure: ESOPHAGOGASTRODUODENOSCOPY (EGD) WITH PROPOFOL;  Surgeon: Irene Shipper, MD;  [facility];  Service: Endoscopy;  Laterality: N/A;   ESOPHAGOGASTRODUODENOSCOPY (EGD) WITH PROPOFOL N/A 11/27/2018   Procedure: ESOPHAGOGASTRODUODENOSCOPY (EGD) WITH PROPOFOL, WITH BALLOON DILATION;  Surgeon: Irene Shipper, MD;  [facility];  Service: Endoscopy;  Laterality: N/A;   ESOPHAGOGASTRODUODENOSCOPY (EGD) WITH PROPOFOL N/A 02/20/2020   Procedure: ESOPHAGOGASTRODUODENOSCOPY (EGD) WITH PROPOFOL;  Surgeon: Irene Shipper, MD;  [facility];  Service: Endoscopy;  Laterality: N/A;   HAND SURGERY     bilateral carpal tunnel releases   JOINT REPLACEMENT     KNEE ARTHROSCOPY Right 2008   LEFT AND RIGHT HEART CATHETERIZATION WITH CORONARY ANGIOGRAM  12/31/2011   Procedure: LEFT AND RIGHT HEART CATHETERIZATION WITH CORONARY ANGIOGRAM;  Surgeon: Burnell Blanks, MD;  Location: [facility] CATH LAB;  Service: Cardiovascular;;   LEFT AND RIGHT HEART CATHETERIZATION WITH CORONARY ANGIOGRAM N/A 01/14/2014   Procedure: LEFT AND RIGHT HEART CATHETERIZATION WITH CORONARY ANGIOGRAM;  Surgeon: Peter M Martinique, MD;  Location: [facility] CATH LAB;  Service: Cardiovascular;  Laterality: N/A;   MALONEY DILATION  03/01/2017   Procedure: Venia Minks DILATION;  Surgeon: Irene Shipper, MD;  [facility]: [facility];  Service: Endoscopy;;   PERCUTANEOUS CORONARY INTERVENTION-BALLOON ONLY  01/03/2012   Procedure: PERCUTANEOUS CORONARY  INTERVENTION-BALLOON ONLY;  Surgeon: Peter M Martinique, MD;  Location: [facility] CATH LAB;  Service: Cardiovascular;;   PERCUTANEOUS CORONARY STENT INTERVENTION (PCI-S)  12/31/2011   Procedure: PERCUTANEOUS CORONARY STENT INTERVENTION (PCI-S);  Surgeon: Burnell Blanks, MD;  Location: [facility] CATH LAB;  Service: Cardiovascular;;   PERCUTANEOUS CORONARY STENT INTERVENTION (PCI-S) N/A 01/03/2012   Procedure: PERCUTANEOUS CORONARY STENT INTERVENTION (PCI-S);  Surgeon: Peter M Martinique, MD;  Location: [facility] CATH LAB;  Service: Cardiovascular;  Laterality: N/A;   SHOULDER ARTHROSCOPY WITH OPEN ROTATOR CUFF REPAIR AND DISTAL CLAVICLE ACROMINECTOMY Left 02/27/2013   Procedure: LEFT SHOULDER ARTHROSCOPY WITH MINI OPEN ROTATOR CUFF REPAIR AND SUBACROMIAL DECOMPRESSION AND DISTAL CLAVICLE RESECTION;  Surgeon: [name], MD;  Location: [place];  Service: Orthopedics;  Laterality: Left;   TOTAL  KNEE ARTHROPLASTY Right 03/2010   [name]   TRANSURETHRAL RESECTION OF BLADDER TUMOR WITH MITOMYCIN-C N/A 10/08/2020   Procedure: TRANSURETHRAL RESECTION OF BLADDER TUMOR WITH GEMCITABINE;  Surgeon: Robley Fries, MD;  Location: [facility];  Service: Urology;  Laterality: N/A;  75 MINS   TRIGGER FINGER RELEASE Left 02/27/2013   Procedure: RELEASE TRIGGER FINGER/A-1 PULLEY;  Surgeon: [name], MD;  Location: [place];  Service: Orthopedics;  Laterality: Left;       Family History  Problem Relation Age of Onset   COPD Mother    Heart disease Father    Heart attack Father    Stomach cancer Brother    Stroke Sister    Alcohol abuse Sister    Colon cancer Brother 25   Diabetes Brother    Rectal cancer Neg Hx     Social History   Tobacco Use   Smoking status: Former    Packs/day: 1.00    Years: 20.00    Pack years: 20.00    Types: Cigarettes    Quit date: 04/06/1963    Years since quitting: 57.8   Smokeless tobacco: Former  Scientific laboratory technician Use: Never used  Substance Use Topics   Alcohol use: No     Alcohol/week: 0.0 standard drinks    Comment: 01/01/2012 "last alcohol ~ 61 yr ago"   Drug use: No    Home Medications Prior to Admission medications   Medication Sig Start Date End Date Taking? Authorizing Provider  amLODipine (NORVASC) 5 MG tablet Take 5 mg by mouth at bedtime. 08/06/19   [provider]  aspirin EC 81 MG tablet Take 81 mg by mouth at bedtime.    [provider]  Blood Glucose Monitoring Suppl (ONE TOUCH ULTRA 2) w/Device KIT Use to obtain blood sugar daily. Dx Code E11.40 04/12/19   Viviana Simpler I, MD  carvedilol (COREG) 6.25 MG tablet Take 6.25 mg by mouth 2 (two) times daily. 11/15/19   [provider]  cefpodoxime (VANTIN) 100 MG tablet Take 1 tablet (100 mg total) by mouth 2 (two) times daily for 7 days. 01/27/21 02/03/21  Deno Etienne, DO  clopidogrel (PLAVIX) 75 MG tablet Take 1 tablet (75 mg total) by mouth daily. Patient taking differently: Take 75 mg by mouth at bedtime. 01/22/20   Burtis Junes, NP  DHA-Vitamin C-Lutein (EYE HEALTH FORMULA PO) Take 1 tablet by mouth daily.     [provider]  furosemide (LASIX) 40 MG tablet Take 1 tablet (40 mg total) by mouth every Monday, Wednesday, and Friday. 04/14/20 12/26/20  Kayleen Memos, DO  glucose blood (ONE TOUCH ULTRA TEST) test strip USE TO CHECK BLOOD SUGAR ONCE A DAY Dx Code E11.40 01/29/20   [name], MD  HYDROcodone-acetaminophen (NORCO) 10-325 MG tablet Take 1-2 tablets by mouth 2 (two) times daily as needed for severe pain. Must last 30 days. 01/09/21 02/08/21  Gillis Santa, MD  HYDROcodone-acetaminophen (NORCO) 10-325 MG tablet Take 1-2 tablets by mouth 2 (two) times daily as needed for severe pain. Must last 30 days. 02/08/21 03/10/21  Gillis Santa, MD  losartan (COZAAR) 25 MG tablet Take 25 mg by mouth every evening.    [provider]  Multiple Vitamins-Minerals (EYE MULTIVITAMIN PO) Take 1 tablet by mouth daily. Patient not taking: Reported on 12/26/2020     [provider]  nitroGLYCERIN (NITROSTAT) 0.4 MG SL tablet DISSOLVE 1 TABLET UNDER TONGUE AS NEEDEDFOR CHEST PAIN. MAY REPEAT 5  MINUTES APART 3 TIMES IF NEEDED. IF NO RELIEF CALL 911 01/19/21   [name], MD  OneTouch Delica Lancets 96G MISC 1 each by In Vitro route daily. Dx Code E11.49 01/29/20   [name], MD  OXYGEN Inhale into the lungs. Per pt- Uses Oxygen 2 liters at night.    [provider]  pantoprazole (PROTONIX) 40 MG tablet TAKE 1 TABLET BY MOUTH TWICE (2) DAILY 07/23/20   Viviana Simpler I, MD  polyethylene glycol (MIRALAX / GLYCOLAX) 17 g packet Take 51 g by mouth every other day.     [provider]  pregabalin (LYRICA) 50 MG capsule Take 1 capsule (50 mg total) by mouth at bedtime. 12/02/20 08/29/21  Gillis Santa, MD  Semaglutide (RYBELSUS) 3 MG TABS Take 1 tablet by mouth daily. Patient not taking: Reported on 12/26/2020 07/24/20   Viviana Simpler I, MD  tamsulosin (FLOMAX) 0.4 MG CAPS capsule Take 0.4 mg by mouth at bedtime. Takes Monday, Wednesday, Friday 01/31/19   [provider]    Allergies    Doxazosin mesylate and Methocarbamol  Review of Systems   Review of Systems  All other systems reviewed and are negative.  Physical Exam Updated Vital Signs BP (!) 156/71 (BP Location: Right Arm)   Pulse 69   Temp 98.4 F (36.9 C) (Oral)   Resp 18   SpO2 95%   Physical Exam Vitals and nursing note reviewed.  Constitutional:      Appearance: Normal appearance.  HENT:     Head: Normocephalic.     Right Ear: External ear normal.     Left Ear: External ear normal.     Nose: Nose normal.     Mouth/Throat:     Pharynx: Oropharynx is clear.  Eyes:     Extraocular Movements: Extraocular movements intact.  Cardiovascular:     Rate and Rhythm: Normal rate and regular rhythm.     Pulses: Normal pulses.  Pulmonary:     Effort: Pulmonary effort is normal.     Breath sounds: Normal breath sounds.  Abdominal:     General:  Abdomen is flat.     Palpations: Abdomen is soft.  Musculoskeletal:        General: Normal range of motion.     Cervical back: Normal range of motion.  Skin:    General: Skin is warm and dry.     Capillary Refill: Capillary refill takes less than 2 seconds.  Neurological:     General: No focal deficit present.     Mental Status: He is alert.  Psychiatric:        Mood and Affect: Mood normal.        Behavior: Behavior normal.    ED Results / Procedures / Treatments   Labs (all labs ordered are listed, but only abnormal results are displayed) Labs Reviewed  CBC WITH DIFFERENTIAL/PLATELET - Abnormal; Notable for the following components:      Result Value   Hemoglobin 12.2 (*)    HCT 38.5 (*)    All other components within normal limits  CULTURE, BLOOD (SINGLE)  URINE CULTURE  PROTIME-INR  APTT  LACTIC ACID, PLASMA  LACTIC ACID, PLASMA  COMPREHENSIVE METABOLIC PANEL  URINALYSIS, ROUTINE W REFLEX MICROSCOPIC    EKG None  Radiology CT ABDOMEN PELVIS WO CONTRAST  Result Date: 01/27/2021 CLINICAL DATA:  Fever and weakness. Bladder cancer with recent chemotherapy EXAM: CT ABDOMEN AND PELVIS WITHOUT CONTRAST TECHNIQUE: Multidetector CT imaging of the abdomen and  pelvis was performed following the standard protocol without IV contrast. COMPARISON:  09/02/2020 FINDINGS: Lower chest: Partly calcified pleural thickening in the right posterior chest. Coronary atherosclerosis Hepatobiliary: No focal liver abnormality.Cholecystectomy. No bile duct dilatation. Pancreas: Unremarkable. Spleen: Unremarkable. Adrenals/Urinary Tract: Negative adrenals. No hydronephrosis or ureteral stone. Generalized cortical atrophy. Left upper pole renal cyst measuring 4.8 cm. Thick walled bladder with bubbles of gas superiorly where there is implied bladder wall thinning. Masslike appearance of the upper bladder is improved. Punctate bilateral renal calculi. No perforation or visible intraluminal hemorrhage.  Stomach/Bowel:  No obstruction. No visible inflammation. Vascular/Lymphatic: No acute vascular abnormality. No mass or adenopathy. Reproductive:Enlarged prostate up lifting the bladder base. Other: No ascites or pneumoperitoneum. Bilateral inguinal hernia, particularly large on the right where there are numerous internal bowel loops. Musculoskeletal: No acute abnormalities. Remote T11 compression fracture. IMPRESSION: 1. No acute finding. 2. Improved bladder mass. 3. Chronic inguinal hernias, very large on the right with multiple internal bowel loops. Electronically Signed   By: Jorje Guild M.D.   On: 01/27/2021 05:14   DG Chest Port 1 View  Result Date: 01/27/2021 CLINICAL DATA:  87 year old male with possible sepsis. EXAM: PORTABLE CHEST 1 VIEW COMPARISON:  Chest x-Malashia Kamaka 04/10/2020. FINDINGS: Lung volumes are low. No consolidative airspace disease. No pleural effusions. No pneumothorax. No pulmonary nodule or mass noted. Pulmonary vasculature and the cardiomediastinal silhouette are within normal limits. Atherosclerotic calcifications in the thoracic aorta. Status post median sternotomy for CABG. IMPRESSION: 1. Low lung volumes without radiographic evidence of acute cardiopulmonary disease. 2. Aortic atherosclerosis. Electronically Signed   By: Vinnie Langton M.D.   On: 01/27/2021 05:10    Procedures Procedures   Medications Ordered in ED Medications - No data to display  ED Course  I have reviewed the triage vital signs and the nursing notes.  Pertinent labs & imaging results that were available during my care of the patient were reviewed by me and considered in my medical decision making (see chart for details).    MDM Rules/Calculators/A&P                         87 yo male with bladder ca who had fever and leukocytosis with positive blood cxs and mixed urine cx. Rocephin given yesterday potentially covered urine pathogens with last grown urine culture, staph epi.  Yesterday's blood  culture coag negative stap hominis potentially skin contaminant, but unclear in this gentleman who has some immunosuppression, leukocytosis, and fever yesterday. Consulted with pharmacy and plan Rocephin and vancomycin.  Will consult with medicine for admission Today leukocytosis improved Kidney function stable. Hyperglycemia without evidence of dka- will give small fluid bolus Discussed with Dr. Eliberto Ivory who will see for admission  Final Clinical Impression(s) / ED Diagnoses Final diagnoses:  Bacteremia  Urinary tract infection with hematuria, site unspecified    Rx / DC Orders ED Discharge Orders     None        [name], MD 01/28/21 1248

## 2021-01-28 NOTE — Progress Notes (Signed)
Pharmacy Antibiotic Note  [name] is a 87 y.o. male admitted on 01/28/2021 presenting after being called to return to ED with positive BCx from previous visit and febrile PTA, also tx for UTI at that time with hx of bladder cancer.  BCx 1/4 staph hominis, previous UCx oxacillin res staph.  Pharmacy has been consulted for vancomycin dosing.  Plan: Vancomycin 1500 mg IV x 1, then 1250 mg IV q 36h (eAUC 466, Goal AUC 400-550, SCr 1.99) Monitor renal function, Re-Cx results and clinical progression to narrow Vancomycin levels as needed      Temp (24hrs), Avg:98.4 F (36.9 C), Min:98.4 F (36.9 C), Max:98.4 F (36.9 C)  Recent Labs  Lab 01/27/21 0418 01/28/21 1034  WBC 18.7* 9.9  CREATININE 2.19* 1.99*  LATICACIDVEN 1.2  --     CrCl cannot be calculated (Unknown ideal weight.).    Allergies  Allergen Reactions   Doxazosin Mesylate Other (See Comments)    dizziness   Methocarbamol Rash    Bertis Ruddy, PharmD Clinical Pharmacist ED Pharmacist Phone # [phone] 01/28/2021 11:57 AM

## 2021-01-28 NOTE — ED Triage Notes (Signed)
Pt was here yesterday for fever/chills, weakness. Was called last night to come back due to + blood cultures.

## 2021-01-28 NOTE — ED Notes (Signed)
RN ordered pt a lunch tray

## 2021-01-28 NOTE — ED Notes (Signed)
Goty patgirnt imnto a gown on the monitor patient is resting with call bell in reach and family at bedside

## 2021-01-28 NOTE — ED Provider Notes (Signed)
Emergency Medicine Provider Triage Evaluation Note  [name] , a 87 y.o. male  was evaluated in triage.  Pt complains of positive blood cultures. Patient was seen here yesterday, diagnosed with UTI, pharmacy did not have rx and had to order the medicine, has not started his antibiotics. Reports feeling well today, sleepy, didn't sleep well last night.  Blind, unable to tell if there is blood in his urine.  On chemo for bladder cancer, last chemo was Monday. Review of Systems  Positive: dysuria Negative: Fever, chills, sweats, nausea, vomiting, abdominal pain  Physical Exam  BP (!) 156/71 (BP Location: Right Arm)   Pulse 69   Temp 98.4 F (36.9 C) (Oral)   Resp 18   SpO2 95%  Gen:   Awake, no distress   Resp:  Normal effort  MSK:   Moves extremities without difficulty  Other:    Medical Decision Making  Medically screening exam initiated at 10:23 AM.  Appropriate orders placed.  [name] was informed that the remainder of the evaluation will be completed by another provider, this initial triage assessment does not replace that evaluation, and the importance of remaining in the ED until their evaluation is complete.     Tacy Learn, PA-C 01/28/21 1027    [name], MD 01/30/21 1136

## 2021-01-28 NOTE — H&P (Signed)
History and Physical    EON ZUNKER VVO:[id number] DOB: 08/07/1931 DOA: 01/28/2021  PCP: [name], MD Consultants:  cardiology: Dr. Johnsie Cancel, GI: [name], Pain: Dr. [name], urology: Dr. Claudia Desanctis, rheum: Dr. Estanislado Pandy  Patient coming from:  Home - lives with wife   Chief Complaint: fever and positive blood cultures   HPI: [name] is a 87 y.o. male with medical history significant of bladder cancer, hypertension, OSA, coronary atherosclerosis with hx of CABG, chronic pain syndrome, chronic kidney disease stage IV, ILD< GERD & dyphagia, chronic diastolic heart failure, Y6RS, HLD, RA who presented to ED after receiving a call and telling him he had a positive blood culture and needed to come to ED. He has not had any fever since early Tuesday AM and last took a tylenol around 5pm yesterday. He does admit to right sided flank pain that started a few days ago. Pain comes and goes and rated as  4/5. Described as constant, achy. No radiation. He does have dysuria, urgency and frequency. Has suprapubic pain, but this is chronic.   He was seen yesterday in ED after he had a bladder cancer treatment (BCG) on Monday and had a fever that night to 104 so came to ED.   Seen in ED on 1025 for fever to 104 found to have what appeared to be a dirty urine blood cultures obtained and given 1 dose of Rocephin and sent home. They called out abx, but they didn't pick this up.  He was called back in due to positive blood culture for Staph hominis.   He denies any headache, vision changes, chest pain, palpitations, shortness of breath, cough, stomach pain, N/V/D. He does have chronic suprapubic pain.   ED Course: vitals: Afebrile, blood pressure 156/71, heart rate 69, respiratory rate 18, oxygen 95% on room air. Pertinent labs: Lactic acid 2.1, glucose 315, BUN 32, creatinine 1.99, UA: Large leukocytes, more than 50 WBC, rare bacteria, greater than 50 RBCs.  Chest x-ray on 10/25: No acute findings.  CT  Abdo pelvis,: No acute finding, improved bladder mass, chronic inguinal hernias, very large on the right with multiple internal bowel loops.  Patient given Rocephin and vancomycin in ED.  blood cultures and urine cultures obtained. Given 500cc bolus and TRH was asked to admit.   Review of Systems: As per HPI; otherwise review of systems reviewed and negative.   Ambulatory Status:  Ambulates with a cane   Past Medical History:  Diagnosis Date   Arthritis    osteoarthritis, s/p R TKR, and digits   CAD (coronary artery disease)    a. s/p CABG (2001)  b. s/p DES to RCA and cutting POBA to ostial PDA (2013)   c. s/p DES to SVG to OM2 (01/14/14) d. cath: 08/2015 NSTEMI w/ patent LIMA-LAD and 99% stenosis of SVG-OM w/ DES placed. CTO of SVG-RCA and SVG-D1.    Cataract    Chronic diastolic CHF (congestive heart failure) ([place])    a) 09/13 ECHO- LVEF 85-46%, grade 1 diastolic dysfunction, mild LA dilatation, atrial septal aneurysm, AV mobility restricted, but no sig AS by doppler; b) 09/04/08 ECHO- LVH, ef 60%, mild AS, c. echo 08/2015: EF perserved of 55-60% with inferolateral HK. Mild AS noted.   Chronic kidney disease, stage III (moderate) ([name])    Chronic lower back pain    Colon polyps    COVID-19    Diverticulosis    Dyspnea 2009 since July -Sept   05/06/08-CPST-  normal  effort, reduced VO2 max 20.5 /65%, reduced at 8.2/ 40%, normal breathing resetvca of 55%, submaximal heart rate response 112/77%, flattened o2 pluse response at peak exercise-12 ml/beat @ 85%, No VQ mismatch abnormalities, All c/w CIRC Limitation   Enlarged prostate    Esophageal stricture    a. s/p dilation spring 2010   GERD (gastroesophageal reflux disease)    Heart murmur    Hiatal hernia    History of carpal tunnel syndrome    Bilateral   History of kidney stones    History of PFTs    mixed pattern on spiro. mild restn on lung volumes with near normal DLCO. Pattern can be explained by CABG scar. Fev1 2.2L/73%, ratio 68  (67), TLC 4.7/68%,RV 1.5L/55%,DLCO 79%   Hyperlipidemia    Hypertension    Interstitial lung disease ([name])    NOS   Iron deficiency anemia    Nausea & vomiting    2018/2019   On home oxygen therapy    2 L [place] at bedtime   Osteoporosis    Overweight (BMI 25.0-29.9)    BMI 29   Peripheral neuropathy    RA (rheumatoid arthritis) ([place])    [name]   Seropositive rheumatoid arthritis ([place])    Type II diabetes mellitus ([place])    diet controlled   Wears glasses    Wears partial dentures    upper    Past Surgical History:  Procedure Laterality Date   BALLOON DILATION N/A 09/12/2018   Procedure: BALLOON DILATION;  Surgeon: Irene Shipper, MD;  Location: [facility];  Service: Endoscopy;  Laterality: N/A;   BALLOON DILATION N/A 02/20/2020   Procedure: BALLOON DILATION;  Surgeon: Irene Shipper, MD;  [facility];  Service: Endoscopy;  Laterality: N/A;   BOTOX INJECTION N/A 09/12/2018   Procedure: BOTOX INJECTION;  Surgeon: Irene Shipper, MD;  [facility];  Service: Endoscopy;  Laterality: N/A;   BOTOX INJECTION N/A 11/27/2018   Procedure: BOTOX INJECTION;  Surgeon: Irene Shipper, MD;  [facility];  Service: Endoscopy;  Laterality: N/A;   BOTOX INJECTION N/A 02/20/2020   Procedure: BOTOX INJECTION;  Surgeon: Irene Shipper, MD;  [facility];  Service: Endoscopy;  Laterality: N/A;   CARDIAC CATHETERIZATION  08/2004   CP- no MI, Cath- small vessell disease    CARDIAC CATHETERIZATION  12/31/2011   80% distal LM, 100% native LAD, LCx and RCA, 30% prox SVG-OM, SVG-D1 normal, 99% distal, 80% ostial SVG-RCA distal to graft, LIMA-LAD normal; LVEF mildly decreased with posterior basal AK    CARDIAC CATHETERIZATION  2009   with patent grafts/notes 12/31/2011   CARDIAC CATHETERIZATION N/A 08/13/2015   Procedure: Left Heart Cath and Cors/Grafts Angiography;  Surgeon: Sherren Mocha, MD;  Location: [place] [facility];  Service: Cardiovascular;  Laterality: N/A;    CATARACT EXTRACTION W/ INTRAOCULAR LENS  IMPLANT, BILATERAL Bilateral    CHOLECYSTECTOMY OPEN  11/2003   [name]   CORONARY ANGIOPLASTY WITH STENT PLACEMENT  01/03/2012   Successful DES to SVG-RCA and cutting balloon angioplasty ostial  PDA    CORONARY ANGIOPLASTY WITH STENT PLACEMENT  01/14/2014   "1"   CORONARY ARTERY BYPASS GRAFT  11/1999   CABG X5   CORONARY STENT PLACEMENT  02/2012   1 stent and balloon   ESOPHAGEAL DILATION  11/27/2018   Procedure: ESOPHAGEAL DILATION;  Surgeon: Irene Shipper, MD;  [facility];  Service: Endoscopy;;   ESOPHAGOGASTRODUODENOSCOPY N/A 03/01/2017   Procedure: ESOPHAGOGASTRODUODENOSCOPY (EGD);  Surgeon: Irene Shipper, MD;  Location: [facility];  Service: Endoscopy;  Laterality: N/A;   ESOPHAGOGASTRODUODENOSCOPY (EGD) WITH ESOPHAGEAL DILATION  2010   ESOPHAGOGASTRODUODENOSCOPY (EGD) WITH PROPOFOL N/A 09/12/2018   Procedure: ESOPHAGOGASTRODUODENOSCOPY (EGD) WITH PROPOFOL;  Surgeon: Irene Shipper, MD;  [facility];  Service: Endoscopy;  Laterality: N/A;   ESOPHAGOGASTRODUODENOSCOPY (EGD) WITH PROPOFOL N/A 11/27/2018   Procedure: ESOPHAGOGASTRODUODENOSCOPY (EGD) WITH PROPOFOL, WITH BALLOON DILATION;  Surgeon: Irene Shipper, MD;  [facility];  Service: Endoscopy;  Laterality: N/A;   ESOPHAGOGASTRODUODENOSCOPY (EGD) WITH PROPOFOL N/A 02/20/2020   Procedure: ESOPHAGOGASTRODUODENOSCOPY (EGD) WITH PROPOFOL;  Surgeon: Irene Shipper, MD;  [facility];  Service: Endoscopy;  Laterality: N/A;   HAND SURGERY     bilateral carpal tunnel releases   JOINT REPLACEMENT     KNEE ARTHROSCOPY Right 2008   LEFT AND RIGHT HEART CATHETERIZATION WITH CORONARY ANGIOGRAM  12/31/2011   Procedure: LEFT AND RIGHT HEART CATHETERIZATION WITH CORONARY ANGIOGRAM;  Surgeon: Burnell Blanks, MD;  Location: [facility] CATH LAB;  Service: Cardiovascular;;   LEFT AND RIGHT HEART CATHETERIZATION WITH CORONARY ANGIOGRAM N/A 01/14/2014   Procedure: LEFT AND  RIGHT HEART CATHETERIZATION WITH CORONARY ANGIOGRAM;  Surgeon: Peter M Martinique, MD;  Location: [facility] CATH LAB;  Service: Cardiovascular;  Laterality: N/A;   MALONEY DILATION  03/01/2017   Procedure: Venia Minks DILATION;  Surgeon: Irene Shipper, MD;  [facility];  Service: Endoscopy;;   PERCUTANEOUS CORONARY INTERVENTION-BALLOON ONLY  01/03/2012   Procedure: PERCUTANEOUS CORONARY INTERVENTION-BALLOON ONLY;  Surgeon: Peter M Martinique, MD;  Location: [facility] CATH LAB;  Service: Cardiovascular;;   PERCUTANEOUS CORONARY STENT INTERVENTION (PCI-S)  12/31/2011   Procedure: PERCUTANEOUS CORONARY STENT INTERVENTION (PCI-S);  Surgeon: Burnell Blanks, MD;  Location: [facility] CATH LAB;  Service: Cardiovascular;;   PERCUTANEOUS CORONARY STENT INTERVENTION (PCI-S) N/A 01/03/2012   Procedure: PERCUTANEOUS CORONARY STENT INTERVENTION (PCI-S);  Surgeon: Peter M Martinique, MD;  Location[facility] CATH LAB;  Service: Cardiovascular;  Laterality: N/A;   SHOULDER ARTHROSCOPY WITH OPEN ROTATOR CUFF REPAIR AND DISTAL CLAVICLE ACROMINECTOMY Left 02/27/2013   Procedure: LEFT SHOULDER ARTHROSCOPY WITH MINI OPEN ROTATOR CUFF REPAIR AND SUBACROMIAL DECOMPRESSION AND DISTAL CLAVICLE RESECTION;  Surgeon: [name], MD;  Location: [place];  Service: Orthopedics;  Laterality: Left;   TOTAL KNEE ARTHROPLASTY Right 03/2010   [name]   TRANSURETHRAL RESECTION OF BLADDER TUMOR WITH MITOMYCIN-C N/A 10/08/2020   Procedure: TRANSURETHRAL RESECTION OF BLADDER TUMOR WITH GEMCITABINE;  Surgeon: Robley Fries, MD;  [facility];  Service: Urology;  Laterality: N/A;  75 MINS   TRIGGER FINGER RELEASE Left 02/27/2013   Procedure: RELEASE TRIGGER FINGER/A-1 PULLEY;  Surgeon: [name], MD;  Location: [place];  Service: Orthopedics;  Laterality: Left;    Social History   Socioeconomic History   Marital status: Married    Spouse name: Not on file   Number of children: 3   Years of education: Not on file   Highest education level:  Not on file  Occupational History   Occupation: Designer, jewellery: RETIRED    Comment: retired  Tobacco Use   Smoking status: Former    Packs/day: 1.00    Years: 20.00    Pack years: 20.00    Types: Cigarettes    Quit date: 04/06/1963    Years since quitting: 57.8   Smokeless tobacco: Former  Scientific laboratory technician Use: Never used  Substance and Sexual Activity   Alcohol use: No  Alcohol/week: 0.0 standard drinks    Comment: 01/01/2012 "last alcohol ~ 50 yr ago"   Drug use: No   Sexual activity: Not Currently  Other Topics Concern   Not on file  Social History Narrative   No living will   Requests wife as health care POA-- alternate is daughter Hassan Rowan   Discussed DNR --he requests this (done 08/29/12)   Not sure about feeding tube---but might accept for some time   Patient lives with wife and daughter in a one story home.  Has 3 children.  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   Social Determinants of Health   Financial Resource Strain: Low Risk    Difficulty of Paying Living Expenses: Not very hard  Food Insecurity: Not on file  Transportation Needs: Not on file  Physical Activity: Not on file  Stress: Not on file  Social Connections: Not on file  Intimate Partner Violence: Not on file    Allergies  Allergen Reactions   Doxazosin Mesylate Other (See Comments)    dizziness   Methocarbamol Rash    Family History  Problem Relation Age of Onset   COPD Mother    Heart disease Father    Heart attack Father    Stomach cancer Brother    Stroke Sister    Alcohol abuse Sister    Colon cancer Brother 25   Diabetes Brother    Rectal cancer Neg Hx     Prior to Admission medications   Medication Sig Start Date End Date Taking? Authorizing Provider  amLODipine (NORVASC) 5 MG tablet Take 5 mg by mouth at bedtime. 08/06/19   [provider]  aspirin EC 81 MG tablet Take 81 mg by mouth at bedtime.    [provider]  Blood Glucose  Monitoring Suppl (ONE TOUCH ULTRA 2) w/Device KIT Use to obtain blood sugar daily. Dx Code E11.40 04/12/19   Viviana Simpler I, MD  carvedilol (COREG) 6.25 MG tablet Take 6.25 mg by mouth 2 (two) times daily. 11/15/19   [provider]  cefpodoxime (VANTIN) 100 MG tablet Take 1 tablet (100 mg total) by mouth 2 (two) times daily for 7 days. 01/27/21 02/03/21  Deno Etienne, DO  clopidogrel (PLAVIX) 75 MG tablet Take 1 tablet (75 mg total) by mouth daily. Patient taking differently: Take 75 mg by mouth at bedtime. 01/22/20   Burtis Junes, NP  DHA-Vitamin C-Lutein (EYE HEALTH FORMULA PO) Take 1 tablet by mouth daily.     [provider]  furosemide (LASIX) 40 MG tablet Take 1 tablet (40 mg total) by mouth every Monday, Wednesday, and Friday. 04/14/20 12/26/20  Kayleen Memos, DO  glucose blood (ONE TOUCH ULTRA TEST) test strip USE TO CHECK BLOOD SUGAR ONCE A DAY Dx Code E11.40 01/29/20   [name], MD  HYDROcodone-acetaminophen (NORCO) 10-325 MG tablet Take 1-2 tablets by mouth 2 (two) times daily as needed for severe pain. Must last 30 days. 01/09/21 02/08/21  Gillis Santa, MD  HYDROcodone-acetaminophen (NORCO) 10-325 MG tablet Take 1-2 tablets by mouth 2 (two) times daily as needed for severe pain. Must last 30 days. 02/08/21 03/10/21  Gillis Santa, MD  losartan (COZAAR) 25 MG tablet Take 25 mg by mouth every evening.    [provider]  Multiple Vitamins-Minerals (EYE MULTIVITAMIN PO) Take 1 tablet by mouth daily. Patient not taking: Reported on 12/26/2020    [provider]  nitroGLYCERIN (NITROSTAT) 0.4 MG SL tablet DISSOLVE 1 TABLET UNDER  TONGUE AS NEEDEDFOR CHEST PAIN. MAY REPEAT 5 MINUTES APART 3 TIMES IF NEEDED. IF NO RELIEF CALL 911 01/19/21   [name], MD  OneTouch Delica Lancets 93X MISC 1 each by In Vitro route daily. Dx Code E11.49 01/29/20   [name], MD  OXYGEN Inhale into the lungs. Per pt- Uses Oxygen 2 liters at night.    [provider]  pantoprazole (PROTONIX) 40 MG tablet TAKE 1 TABLET BY MOUTH TWICE (2) DAILY 07/23/20   Viviana Simpler I, MD  polyethylene glycol (MIRALAX / GLYCOLAX) 17 g packet Take 51 g by mouth every other day.     [provider]  pregabalin (LYRICA) 50 MG capsule Take 1 capsule (50 mg total) by mouth at bedtime. 12/02/20 08/29/21  Gillis Santa, MD  Semaglutide (RYBELSUS) 3 MG TABS Take 1 tablet by mouth daily. Patient not taking: Reported on 12/26/2020 07/24/20   Viviana Simpler I, MD  tamsulosin (FLOMAX) 0.4 MG CAPS capsule Take 0.4 mg by mouth at bedtime. Takes Monday, Wednesday, Friday 01/31/19   [provider]    Physical Exam: Vitals:   01/28/21 0958 01/28/21 1130 01/28/21 1154 01/28/21 1237  BP: (!) 156/71 (!) 166/69 (!) 166/69 (!) 136/56  Pulse: 69 62 (!) 58 (!) 56  Resp: _0 (!) 24  Temp: 98.4 F (36.9 C)     TempSrc: Oral     SpO2: 95% 97% 94% 95%     General:  Appears calm and comfortable and is in NAD Eyes:  PERRL, EOMI, normal lids, iris ENT:  hard of hearing, lips & tongue, mmm; appropriate dentition: dentures upper Neck:  no LAD, masses or thyromegaly; no carotid bruits Cardiovascular:  RRR, loud systolic murmur. No LE edema.  Respiratory:   bilateral lower lobe velcro sounds. Otherwise normal exam with no wheezing/rales. Good air movement.   Normal respiratory effort. Abdomen:  soft, NT, ND, NABS Back:   normal alignment, no CVAT Skin:  no rash or induration seen on limited exam Musculoskeletal:  grossly normal tone BUE/BLE, good ROM, no bony abnormality Lower extremity:  No LE edema.  Limited foot exam with no ulcerations.  2+ distal pulses. Psychiatric:  grossly normal mood and affect, speech fluent and appropriate, AOx3 Neurologic:  CN 2-12 grossly intact, moves all extremities in coordinated fashion, sensation intact    Radiological Exams on Admission: Independently reviewed - see discussion in A/P where applicable  CT ABDOMEN  PELVIS WO CONTRAST  Result Date: 01/27/2021 CLINICAL DATA:  Fever and weakness. Bladder cancer with recent chemotherapy EXAM: CT ABDOMEN AND PELVIS WITHOUT CONTRAST TECHNIQUE: Multidetector CT imaging of the abdomen and pelvis was performed following the standard protocol without IV contrast. COMPARISON:  09/02/2020 FINDINGS: Lower chest: Partly calcified pleural thickening in the right posterior chest. Coronary atherosclerosis Hepatobiliary: No focal liver abnormality.Cholecystectomy. No bile duct dilatation. Pancreas: Unremarkable. Spleen: Unremarkable. Adrenals/Urinary Tract: Negative adrenals. No hydronephrosis or ureteral stone. Generalized cortical atrophy. Left upper pole renal cyst measuring 4.8 cm. Thick walled bladder with bubbles of gas superiorly where there is implied bladder wall thinning. Masslike appearance of the upper bladder is improved. Punctate bilateral renal calculi. No perforation or visible intraluminal hemorrhage. Stomach/Bowel:  No obstruction. No visible inflammation. Vascular/Lymphatic: No acute vascular abnormality. No mass or adenopathy. Reproductive:Enlarged prostate up lifting the bladder base. Other: No ascites or pneumoperitoneum. Bilateral inguinal hernia, particularly large on the right where there are numerous internal bowel loops. Musculoskeletal: No acute abnormalities. Remote T11 compression fracture. IMPRESSION: 1.  No acute finding. 2. Improved bladder mass. 3. Chronic inguinal hernias, very large on the right with multiple internal bowel loops. Electronically Signed   By: Jorje Guild M.D.   On: 01/27/2021 05:14   DG Chest Port 1 View  Result Date: 01/27/2021 CLINICAL DATA:  87 year old male with possible sepsis. EXAM: PORTABLE CHEST 1 VIEW COMPARISON:  Chest x-ray 04/10/2020. FINDINGS: Lung volumes are low. No consolidative airspace disease. No pleural effusions. No pneumothorax. No pulmonary nodule or mass noted. Pulmonary vasculature and the cardiomediastinal  silhouette are within normal limits. Atherosclerotic calcifications in the thoracic aorta. Status post median sternotomy for CABG. IMPRESSION: 1. Low lung volumes without radiographic evidence of acute cardiopulmonary disease. 2. Aortic atherosclerosis. Electronically Signed   By: Vinnie Langton M.D.   On: 01/27/2021 05:10    EKG: Independently reviewed.  NSR with rate 76; nonspecific ST changes with no evidence of acute ischemia (10/25)   Labs on Admission: I have personally reviewed the available labs and imaging studies at the time of the admission.  Pertinent labs:  Lactic acid 2.1>1.6 glucose 315,  BUN 32,  creatinine 1.99 (1.98-2.11) GFR: 29-30 UA: Large leukocytes, more than 50 WBC, rare bacteria, greater than 50 RBCs.    Assessment/Plan Principal Problem:   UTI with bacteremia  -87 year old man originally presenting on 10/25 with fever to 104 and dysuria.  Urine appeared to be consistent with UTI.  Given dose of Rocephin and sent home.  Blood culture grew out staph hominis and asked to return to ED. -Admit to MedSurg for UTI and gram positive bacteremia for IV antibiotics.  -antibiotics discussed with pharmacy and will continue Rocephin and vancomycin for now -Blood culture may have been a contaminant repeat pending-Urine culture pending -Dysuria could be from BCG treatment versus UTI. -original blood cx with no mycobacterium. (Very rare from bcg)   Active Problems:    Bladder cancer ([place]) -hx of BCG treatments, last treatment on Monday -follows outpatient with Dr. Claudia Desanctis     DM (diabetes mellitus) type II controlled, neurological manifestation ([place]) -typically well controlled, off rybelsus -recent A1c of 6.9 in 12/2020 -sugar elevated today at 315.  -start sliding scaled sensitive insulin, if continues to stay elevated will need to add long acting for better control.  -routine accuchecks per protocol  -continue lyrica     CKD (chronic kidney disease) stage 4, GFR 15-29  ml/min ([name]) -at baseline -continue to monitor, avoid nephrotoxic drugs -intake/output     Coronary atherosclerosis/HLD/hx of CABG -prior DES in 2013, 2015 and 2017 and remote CABG -no active chest pain -continue medical management with ASA, plavix,     Essential hypertension - well-controlled continue home medication of norvasc, losartan and coreg     GERD -continue PPI    Chronic diastolic heart failure ([place]) -appears euvolemic -continue medical management with ARB, beta blocker and lasix  -daily weights/intake and output  -last echo in 04/2020: EF of 55 to 60% with normal LV function.  Grade 1 diastolic dysfunction.  Due to right ventricular systolic function.  Mild aortic valve stenosis.    Obstructive sleep apnea -  Uses 2 L oxygen via nasal cannula nightly this was ordered for him    Chronic pain syndrome -Continue home pain meds of Norco -PMP website verified and follows outpatient with Dr. [name]     Rheumatoid arthritis ([place]) -stable. On no immunotherapy.    ILD Stable.   Mild AS -stable, follows outpatient cardiology   There is no height or weight  on file to calculate BMI.   Level of care: Med-Surg DVT prophylaxis:  Lovenox Code Status: DNR- confirmed with patient Family Communication: None present; I spoke with the patient's daughter by telephone at the time of admission. Francee Gentile [phone] Disposition Plan:  The patient is from: home  Anticipated d/c is to: home    Requires inpatient hospitalization for IV antibiotics and is at significant risk of worsening, requires constant monitoring and assessment.    Patient is currently: stable  Consults called: none  Admission status:  observation   Dragon dictation used in completing this note.   Orma Flaming MD [facility]   How to contact the [facility] Attending or Consulting provider [place] or covering provider during after hours [place], for this patient?  Check the care team in [facility] and  look for a) attending/consulting [facility] provider listed and b) the [facility] team listed Log into [email or web address] and use 's universal password to access. If you do not have the password, please contact the hospital operator. Locate the [facility] provider you are looking for under [facility] and page to a number that you can be directly reached. If you still have difficulty reaching the provider, please page the [facility] (Director on Call) for the Hospitalists listed on amion for assistance.   01/28/2021, 1:56 PM

## 2021-01-29 ENCOUNTER — Observation Stay (HOSPITAL_COMMUNITY): Payer: Medicare Other

## 2021-01-29 DIAGNOSIS — E1142 Type 2 diabetes mellitus with diabetic polyneuropathy: Secondary | ICD-10-CM | POA: Diagnosis present

## 2021-01-29 DIAGNOSIS — E1140 Type 2 diabetes mellitus with diabetic neuropathy, unspecified: Secondary | ICD-10-CM

## 2021-01-29 DIAGNOSIS — N3001 Acute cystitis with hematuria: Secondary | ICD-10-CM

## 2021-01-29 DIAGNOSIS — N390 Urinary tract infection, site not specified: Secondary | ICD-10-CM | POA: Diagnosis present

## 2021-01-29 DIAGNOSIS — Z9981 Dependence on supplemental oxygen: Secondary | ICD-10-CM | POA: Diagnosis not present

## 2021-01-29 DIAGNOSIS — E119 Type 2 diabetes mellitus without complications: Secondary | ICD-10-CM | POA: Diagnosis not present

## 2021-01-29 DIAGNOSIS — G4733 Obstructive sleep apnea (adult) (pediatric): Secondary | ICD-10-CM | POA: Diagnosis present

## 2021-01-29 DIAGNOSIS — E785 Hyperlipidemia, unspecified: Secondary | ICD-10-CM | POA: Diagnosis present

## 2021-01-29 DIAGNOSIS — Z66 Do not resuscitate: Secondary | ICD-10-CM | POA: Diagnosis present

## 2021-01-29 DIAGNOSIS — R7881 Bacteremia: Secondary | ICD-10-CM | POA: Diagnosis not present

## 2021-01-29 DIAGNOSIS — J449 Chronic obstructive pulmonary disease, unspecified: Secondary | ICD-10-CM | POA: Diagnosis present

## 2021-01-29 DIAGNOSIS — G894 Chronic pain syndrome: Secondary | ICD-10-CM

## 2021-01-29 DIAGNOSIS — J849 Interstitial pulmonary disease, unspecified: Secondary | ICD-10-CM | POA: Diagnosis present

## 2021-01-29 DIAGNOSIS — E872 Acidosis, unspecified: Secondary | ICD-10-CM | POA: Diagnosis present

## 2021-01-29 DIAGNOSIS — M059 Rheumatoid arthritis with rheumatoid factor, unspecified: Secondary | ICD-10-CM | POA: Diagnosis present

## 2021-01-29 DIAGNOSIS — Z20822 Contact with and (suspected) exposure to covid-19: Secondary | ICD-10-CM | POA: Diagnosis present

## 2021-01-29 DIAGNOSIS — I10 Essential (primary) hypertension: Secondary | ICD-10-CM | POA: Diagnosis not present

## 2021-01-29 DIAGNOSIS — I5032 Chronic diastolic (congestive) heart failure: Secondary | ICD-10-CM | POA: Diagnosis not present

## 2021-01-29 DIAGNOSIS — A419 Sepsis, unspecified organism: Secondary | ICD-10-CM | POA: Diagnosis present

## 2021-01-29 DIAGNOSIS — I2510 Atherosclerotic heart disease of native coronary artery without angina pectoris: Secondary | ICD-10-CM | POA: Diagnosis present

## 2021-01-29 DIAGNOSIS — M545 Low back pain, unspecified: Secondary | ICD-10-CM | POA: Diagnosis present

## 2021-01-29 DIAGNOSIS — E1122 Type 2 diabetes mellitus with diabetic chronic kidney disease: Secondary | ICD-10-CM | POA: Diagnosis present

## 2021-01-29 DIAGNOSIS — C679 Malignant neoplasm of bladder, unspecified: Secondary | ICD-10-CM | POA: Diagnosis present

## 2021-01-29 DIAGNOSIS — E1165 Type 2 diabetes mellitus with hyperglycemia: Secondary | ICD-10-CM | POA: Diagnosis present

## 2021-01-29 DIAGNOSIS — Z951 Presence of aortocoronary bypass graft: Secondary | ICD-10-CM | POA: Diagnosis not present

## 2021-01-29 DIAGNOSIS — N184 Chronic kidney disease, stage 4 (severe): Secondary | ICD-10-CM | POA: Diagnosis not present

## 2021-01-29 DIAGNOSIS — A411 Sepsis due to other specified staphylococcus: Secondary | ICD-10-CM | POA: Diagnosis present

## 2021-01-29 DIAGNOSIS — K219 Gastro-esophageal reflux disease without esophagitis: Secondary | ICD-10-CM | POA: Diagnosis present

## 2021-01-29 DIAGNOSIS — J9611 Chronic respiratory failure with hypoxia: Secondary | ICD-10-CM | POA: Diagnosis present

## 2021-01-29 DIAGNOSIS — I130 Hypertensive heart and chronic kidney disease with heart failure and stage 1 through stage 4 chronic kidney disease, or unspecified chronic kidney disease: Secondary | ICD-10-CM | POA: Diagnosis present

## 2021-01-29 LAB — CBC WITH DIFFERENTIAL/PLATELET
Abs Immature Granulocytes: 0.03 10*3/uL (ref 0.00–0.07)
Basophils Absolute: 0 10*3/uL (ref 0.0–0.1)
Basophils Relative: 1 %
Eosinophils Absolute: 0.1 10*3/uL (ref 0.0–0.5)
Eosinophils Relative: 1 %
HCT: 39.9 % (ref 39.0–52.0)
Hemoglobin: 12.7 g/dL — ABNORMAL LOW (ref 13.0–17.0)
Immature Granulocytes: 0 %
Lymphocytes Relative: 18 %
Lymphs Abs: 1.4 10*3/uL (ref 0.7–4.0)
MCH: 26.3 pg (ref 26.0–34.0)
MCHC: 31.8 g/dL (ref 30.0–36.0)
MCV: 82.8 fL (ref 80.0–100.0)
Monocytes Absolute: 0.5 10*3/uL (ref 0.1–1.0)
Monocytes Relative: 6 %
Neutro Abs: 6 10*3/uL (ref 1.7–7.7)
Neutrophils Relative %: 74 %
Platelets: 240 10*3/uL (ref 150–400)
RBC: 4.82 MIL/uL (ref 4.22–5.81)
RDW: 14.2 % (ref 11.5–15.5)
WBC: 8.1 10*3/uL (ref 4.0–10.5)
nRBC: 0 % (ref 0.0–0.2)

## 2021-01-29 LAB — BASIC METABOLIC PANEL
Anion gap: 11 (ref 5–15)
BUN: 27 mg/dL — ABNORMAL HIGH (ref 8–23)
CO2: 26 mmol/L (ref 22–32)
Calcium: 9.8 mg/dL (ref 8.9–10.3)
Chloride: 103 mmol/L (ref 98–111)
Creatinine, Ser: 1.94 mg/dL — ABNORMAL HIGH (ref 0.61–1.24)
GFR, Estimated: 33 mL/min — ABNORMAL LOW (ref >60)
Glucose, Bld: 158 mg/dL — ABNORMAL HIGH (ref 70–99)
Potassium: 3.6 mmol/L (ref 3.5–5.1)
Sodium: 140 mmol/L (ref 135–145)

## 2021-01-29 LAB — CBC
HCT: 39.5 % (ref 39.0–52.0)
Hemoglobin: 12.8 g/dL — ABNORMAL LOW (ref 13.0–17.0)
MCH: 26.8 pg (ref 26.0–34.0)
MCHC: 32.4 g/dL (ref 30.0–36.0)
MCV: 82.8 fL (ref 80.0–100.0)
Platelets: 221 10*3/uL (ref 150–400)
RBC: 4.77 MIL/uL (ref 4.22–5.81)
RDW: 14.5 % (ref 11.5–15.5)
WBC: 9.7 10*3/uL (ref 4.0–10.5)
nRBC: 0 % (ref 0.0–0.2)

## 2021-01-29 LAB — CBG MONITORING, ED: Glucose-Capillary: 163 mg/dL — ABNORMAL HIGH (ref 70–99)

## 2021-01-29 LAB — ECHOCARDIOGRAM COMPLETE
AR max vel: 1.49 cm2
AV Area VTI: 1.35 cm2
AV Area mean vel: 1.34 cm2
AV Mean grad: 14 mmHg
AV Peak grad: 24.4 mmHg
Ao pk vel: 2.47 m/s
Area-P 1/2: 3.39 cm2
S' Lateral: 3.2 cm
Weight: 3022.95 oz

## 2021-01-29 LAB — GLUCOSE, CAPILLARY
Glucose-Capillary: 215 mg/dL — ABNORMAL HIGH (ref 70–99)
Glucose-Capillary: 197 mg/dL — ABNORMAL HIGH (ref 70–99)
Glucose-Capillary: 202 mg/dL — ABNORMAL HIGH (ref 70–99)

## 2021-01-29 MED ORDER — HEPARIN SODIUM (PORCINE) 5000 UNIT/ML IJ SOLN: 5000.0000 [IU] | Freq: Three times a day (TID) | INTRAMUSCULAR | Status: DC

## 2021-01-29 NOTE — Progress Notes (Signed)
PROGRESS NOTE    [name]  OEU:[id number] DOB: 05/20/1931 DOA: 01/28/2021 PCP: [name], MD    Brief Narrative:  [name] was admitted to the hospital with the working diagnosis of urinary tract infection, complicated with sepsis.   87 year old male past medical history for bladder cancer, hypertension, obstructive sleep apnea, coronary artery disease status post CABG, chronic pain syndrome, chronic kidney disease stage IV, dyslipidemia, GERD, type 2 diabetes mellitus, and rheumatoid arthritis who was called to come back to the emergency department due to positive blood cultures.  He initially presented to the emergency room on 10/25 because of fever, 24 hours prior he did receive BCG treatment for bladder cancer.  Patient was diagnosed with urine tract infection, cultures were obtained, he received ceftriaxone and he was discharged home. This time on his initial evaluation his blood pressure was 156/71, heart rate 69, respiratory rate 18, oxygen saturation 95% on room air, his lungs had rales bilaterally at bases, no wheezing, heart S1-S2, present, rhythmic, soft abdomen, no lower extremity edema.  Sodium 137, potassium 4.3, chloride 103, bicarb 24, glucose 315, BUN 32, creatinine 1.99, lactic acid 2.1, white count 9.9 (down from 18.7), hemoglobin 12.2, hematocrit 38.5, platelets 220.  SARS COVID-19 negative.  Urinalysis specific gravity 1.016, large leukocytes, > 50 white cells, > 50 red cells.  10/25 CT abdomen/pelvis with no acute findings.  Improved bladder mass.  Chronic inguinal hernias (very large on the right with multiple internal bowel loops).  10/25 chest radiograph no infiltrates.  EKG 55 bpm, left axis deviation, normal intervals, sinus rhythm with PVCs, trigeminy pattern, no significant ST segment or T wave changes.  Patient was placed on intravenous antibiotic therapy with ceftriaxone.   Assessment & Plan:   Principal Problem:   UTI (urinary tract  infection) Active Problems:   Obstructive sleep apnea   Essential hypertension   Coronary atherosclerosis   GERD   Chronic diastolic heart failure ([name])   CKD (chronic kidney disease) stage 4, GFR 15-29 ml/min ([name])   DM (diabetes mellitus) type II controlled, neurological manifestation ([name])   HLD (hyperlipidemia)   Rheumatoid arthritis ([name])   Chronic pain syndrome   Bacteremia due to Gram-positive bacteria   Bladder cancer ([name])   Urinary tract infection with sepsis, present on admission, in the setting of bladder cancer. Patient with no fevers and wbc is trending down. Blood culture one bottle with staph hominis, possible a contamination.  Urine culture positive for staph hemolyticus, possible another contamination.  Plan to follow up on final culture results from urine and repeat blood culture. For now will continue with ceftriaxone and will check echocardiogram. Discontinue vancomycin.   Continue close follow up on cell count and temperature curve. Out of bed to chair tid with meals, PT and OT.  Follow as outpatient for bladder cancer, he has no bladder catheter.   2. CKD stage 4 stable renal function with serum cr at 1,94 with K at 3,6 and serum bicarbonate at 26. Plan to continue close monitoring of renal function and electrolytes Resume furosemide per his home regimen.   3. T2DM with hyperglycemia. Glucose has improved, today fasting is 158. Continue insulin sliding scale for glucose cover and monitoring.  For neuropathy continue with pregabalin.   4. CAD sp CABG No chest pain. Continue with aspirin and clopidogrel.   5. HTN/ chronic diastolic heart failure Continue blood pressure monitoring, resumed furosemide. Continue with amlodipine, losartan and carvedilol.   6. GERD continue antiacid therapy.  7. RA and ILD stable with no signs of exacerbation   Patient continue to be at high risk for worsening sepsis   Status is: Observation  The patient will require  care spanning > 2 midnights and should be moved to inpatient because: IV antibiotic therapy   DVT prophylaxis: Heparin   Code Status:    full  Family Communication:    I spoke over the phone with the patient's daughter about patient's  condition, plan of care, prognosis and all questions were addressed.   Antimicrobials:  Ceftriaxone    Subjective: Patient with no nausea or vomiting, no chest pain, continue to be weak and deconditioned, not back to his baseline,   Objective: Vitals:   01/29/21 0900 01/29/21 1000 01/29/21 1100 01/29/21 1155  BP: (!) 167/75 (!) 139/108 (!) 159/72 (!) 176/78  Pulse: 63 66 63 66  Resp: 19 (!) 24 (!) 22 19  Temp:    97.7 F (36.5 C)  TempSrc:    Oral  SpO2: 96% 96% 95% 96%  Weight: 85.7 kg       Intake/Output Summary (Last 24 hours) at 01/29/2021 1205 Last data filed at 01/28/2021 1401 Gross per 24 hour  Intake 600 ml  Output --  Net 600 ml   Filed Weights   01/29/21 0900  Weight: 85.7 kg    Examination:   General: Not in pain or dyspnea, deconditioned  Neurology: Awake and alert, non focal  E ENT: no pallor, no icterus, oral mucosa moist Cardiovascular: No JVD. S1-S2 present, rhythmic, no gallops, rubs, or positive systolic murmur 2-4/5 right sternal border. No lower extremity edema. Pulmonary: positive breath sounds bilaterally, with no wheezing, rhonchi or rales. Gastrointestinal. Abdomen soft and non tender Skin. No rashes Musculoskeletal: no joint deformities     Data Reviewed: I have personally reviewed following labs and imaging studies  CBC: Recent Labs  Lab 01/27/21 0418 01/28/21 1034 01/29/21 0259  WBC 18.7* 9.9 9.7  NEUTROABS 16.9* 7.5  --   HGB 12.4* 12.2* 12.8*  HCT 39.4 38.5* 39.5  MCV 84.9 85.2 82.8  PLT 240 220 809   Basic Metabolic Panel: Recent Labs  Lab 01/27/21 0418 01/28/21 1034 01/29/21 0259  NA 137 137 140  K 4.5 4.3 3.6  CL 104 103 103  CO2 24 24 26   GLUCOSE 236* 315* 158*  BUN 35* 32*  27*  CREATININE 2.19* 1.99* 1.94*  CALCIUM 9.6 9.6 9.8   GFR: Estimated Creatinine Clearance: 29.4 mL/min (A) (by C-G formula based on SCr of 1.94 mg/dL (H)). Liver Function Tests: Recent Labs  Lab 01/27/21 0418 01/28/21 1034  AST 30 28  ALT 20 24  ALKPHOS 79 72  BILITOT 1.0 0.9  PROT 8.1 7.9  ALBUMIN 3.3* 3.2*   Recent Labs  Lab 01/27/21 0418  LIPASE 32   No results for input(s): AMMONIA in the last 168 hours. Coagulation Profile: Recent Labs  Lab 01/27/21 0418 01/28/21 1034  INR 1.1 1.1   Cardiac Enzymes: No results for input(s): CKTOTAL, CKMB, CKMBINDEX, TROPONINI in the last 168 hours. BNP (last 3 results) No results for input(s): PROBNP in the last 8760 hours. HbA1C: No results for input(s): HGBA1C in the last 72 hours. CBG: Recent Labs  Lab 01/28/21 1851 01/28/21 2032 01/28/21 2254 01/29/21 0804 01/29/21 1159  GLUCAP 175* 164* 136* 163* 215*   Lipid Profile: No results for input(s): CHOL, HDL, LDLCALC, TRIG, CHOLHDL, LDLDIRECT in the last 72 hours. Thyroid Function Tests: No results for input(s): TSH,  T4TOTAL, FREET4, T3FREE, THYROIDAB in the last 72 hours. Anemia Panel: No results for input(s): VITAMINB12, FOLATE, FERRITIN, TIBC, IRON, RETICCTPCT in the last 72 hours.    Radiology Studies: I have reviewed all of the imaging during this hospital visit personally     Scheduled Meds:  amLODipine  5 mg Oral QHS   aspirin EC  81 mg Oral QHS   carvedilol  6.25 mg Oral BID   clopidogrel  75 mg Oral QHS   enoxaparin (LOVENOX) injection  30 mg Subcutaneous Q24H   furosemide  40 mg Oral Q M,W,F   insulin aspart  0-9 Units Subcutaneous TID WC   losartan  25 mg Oral QPM   pantoprazole  40 mg Oral BID   pregabalin  50 mg Oral QHS   sodium chloride flush  3 mL Intravenous Q12H   tamsulosin  0.4 mg Oral QHS   Continuous Infusions:  sodium chloride     cefTRIAXone (ROCEPHIN)  IV     [START ON 01/30/2021] vancomycin       LOS: 0 days         Shaton Lore Gerome Apley, MD

## 2021-01-29 NOTE — Progress Notes (Signed)
  Echocardiogram 2D Echocardiogram has been performed.  Merrie Roof F 01/29/2021, 2:42 PM

## 2021-01-29 NOTE — Evaluation (Addendum)
Physical Therapy Evaluation Patient Details Name: [name] MRN: [id number] DOB: 06-13-1931 Today's Date: 01/29/2021  History of Present Illness  Pt adm 74/08 with UTI, complicated with sepsis. PMH - bladder CA, HTN, OSA, CAD, CABG, chronic pain, ckd, DM, RA, TKR, chf  Clinical Impression  Pt presents to PT with unsteady gait due to illness and inactivity. Expect pt will make good progress back to baseline with mobility. Will follow acutely but may notl need PT after DC. Will update recommendations as pt progresses        Recommendations for follow up therapy are one component of a multi-disciplinary discharge planning process, led by the attending physician.  Recommendations may be updated based on patient status, additional functional criteria and insurance authorization.  Follow Up Recommendations No PT follow up    Assistance Recommended at Discharge Intermittent Supervision/Assistance  Functional Status Assessment Patient has had a recent decline in their functional status and demonstrates the ability to make significant improvements in function in a reasonable and predictable amount of time.  Equipment Recommendations  None recommended by PT    Recommendations for Other Services Other (comment) (mobility specialist)     Precautions / Restrictions Precautions Precautions: Fall      Mobility  Bed Mobility Overal bed mobility: Needs Assistance Bed Mobility: Supine to Sit     Supine to sit: Min guard;HOB elevated     General bed mobility comments: Assist for lines/safety. Incr time to perform    Transfers Overall transfer level: Needs assistance Equipment used: Rolling walker (2 wheels) Transfers: Sit to/from Stand Sit to Stand: Min assist           General transfer comment: Assist to bring hips up and for balance    Ambulation/Gait Ambulation/Gait assistance: Min assist Gait Distance (Feet): 175 Feet Assistive device: Rolling walker (2 wheels) Gait  Pattern/deviations: Step-through pattern;Decreased stride length;Trunk flexed Gait velocity: decr Gait velocity interpretation: 1.31 - 2.62 ft/sec, indicative of limited community ambulator General Gait Details: Assist for balance.  Stairs            Wheelchair Mobility    Modified Rankin (Stroke Patients Only)       Balance Overall balance assessment: Needs assistance Sitting-balance support: No upper extremity supported;Feet supported Sitting balance-Leahy Scale: Good     Standing balance support: Bilateral upper extremity supported Standing balance-Leahy Scale: Poor Standing balance comment: walker and min guard for static standing                             Pertinent Vitals/Pain Pain Assessment: No/denies pain    Home Living Family/patient expects to be discharged to:: Private residence Living Arrangements: Spouse/significant other;Children Available Help at Discharge: Family;Available 24 hours/day Type of Home: House Home Access: Stairs to enter Entrance Stairs-Rails: Left Entrance Stairs-Number of Steps: 2     Home Equipment: Rollator (4 wheels);Cane - single point;Grab bars - tub/shower;Grab bars - toilet;BSC      Prior Function Prior Level of Function : Independent/Modified Independent             Mobility Comments: Using straight cane at baseline       Hand Dominance   Dominant Hand: Right    Extremity/Trunk Assessment   Upper Extremity Assessment Upper Extremity Assessment: Defer to OT evaluation    Lower Extremity Assessment Lower Extremity Assessment: Generalized weakness    Cervical / Trunk Assessment Cervical / Trunk Assessment: Kyphotic  Communication   Communication: No  difficulties  Cognition Arousal/Alertness: Awake/alert Behavior During Therapy: WFL for tasks assessed/performed Overall Cognitive Status: Within Functional Limits for tasks assessed                                           General Comments      Exercises     Assessment/Plan    PT Assessment Patient needs continued PT services  PT Problem List Decreased strength;Decreased balance;Decreased mobility;Decreased activity tolerance       PT Treatment Interventions DME instruction;Functional mobility training;Balance training;Patient/family education;Therapeutic activities;Gait training;Therapeutic exercise    PT Goals (Current goals can be found in the Care Plan section)  Acute Rehab PT Goals Patient Stated Goal: get better and return home PT Goal Formulation: With patient Time For Goal Achievement: 02/12/21 Potential to Achieve Goals: Good    Frequency Min 3X/week   Barriers to discharge        Co-evaluation               AM-PAC PT "6 Clicks" Mobility  Outcome Measure Help needed turning from your back to your side while in a flat bed without using bedrails?: A Little Help needed moving from lying on your back to sitting on the side of a flat bed without using bedrails?: A Little Help needed moving to and from a bed to a chair (including a wheelchair)?: A Little Help needed standing up from a chair using your arms (e.g., wheelchair or bedside chair)?: A Little Help needed to walk in hospital room?: A Little Help needed climbing 3-5 steps with a railing? : A Little 6 Click Score: 18    End of Session Equipment Utilized During Treatment: Gait belt Activity Tolerance: Patient limited by fatigue Patient left: in chair;with call bell/phone within reach;with family/visitor present;with chair alarm set Nurse Communication: Mobility status PT Visit Diagnosis: Unsteadiness on feet (R26.81);Other abnormalities of gait and mobility (R26.89);Muscle weakness (generalized) (M62.81)    Time: 9753-0051 PT Time Calculation (min) (ACUTE ONLY): 22 min   Charges:   PT Evaluation $PT Eval Moderate Complexity: [place] Pager  [phone] Office [place] 01/29/2021, 5:03 PM

## 2021-01-29 NOTE — ED Notes (Signed)
Breakfast orders placed 

## 2021-01-30 DIAGNOSIS — C679 Malignant neoplasm of bladder, unspecified: Secondary | ICD-10-CM | POA: Diagnosis not present

## 2021-01-30 DIAGNOSIS — N390 Urinary tract infection, site not specified: Secondary | ICD-10-CM | POA: Diagnosis not present

## 2021-01-30 DIAGNOSIS — E119 Type 2 diabetes mellitus without complications: Secondary | ICD-10-CM

## 2021-01-30 DIAGNOSIS — N184 Chronic kidney disease, stage 4 (severe): Secondary | ICD-10-CM | POA: Diagnosis not present

## 2021-01-30 LAB — BASIC METABOLIC PANEL
Anion gap: 9 (ref 5–15)
BUN: 31 mg/dL — ABNORMAL HIGH (ref 8–23)
CO2: 26 mmol/L (ref 22–32)
Calcium: 9.4 mg/dL (ref 8.9–10.3)
Chloride: 100 mmol/L (ref 98–111)
Creatinine, Ser: 1.99 mg/dL — ABNORMAL HIGH (ref 0.61–1.24)
GFR, Estimated: 32 mL/min — ABNORMAL LOW (ref >60)
Glucose, Bld: 150 mg/dL — ABNORMAL HIGH (ref 70–99)
Potassium: 3.5 mmol/L (ref 3.5–5.1)
Sodium: 135 mmol/L (ref 135–145)

## 2021-01-30 LAB — URINE CULTURE: Culture: >=100000 — AB

## 2021-01-30 LAB — GLUCOSE, CAPILLARY: Glucose-Capillary: 161 mg/dL — ABNORMAL HIGH (ref 70–99)

## 2021-01-30 MED ORDER — DOXYCYCLINE HYCLATE 100 MG PO TABS: 100.0000 mg | ORAL_TABLET | Freq: Two times a day (BID) | ORAL | Status: DC

## 2021-01-30 MED ORDER — DOXYCYCLINE HYCLATE 100 MG PO TABS: 100.0000 mg | ORAL_TABLET | Freq: Two times a day (BID) | ORAL | 0 refills | Status: AC

## 2021-01-30 NOTE — Progress Notes (Signed)
[name] to be D/C'd  per MD order.  Discussed with the patient and all questions fully answered.  VSS, Skin clean, dry and intact without evidence of skin break down, no evidence of skin tears noted.  IV catheter discontinued intact. Site without signs and symptoms of complications. Dressing and pressure applied.  An After Visit Summary was printed and given to the patient.   Patient instructed to return to ED, call 911, or call MD for any changes in condition.   Patient to be escorted via [place], and D/C home via private auto.

## 2021-01-30 NOTE — Progress Notes (Signed)
Mobility Specialist Progress Note    01/30/21 1036  Mobility  Activity Ambulated in hall  Level of Assistance Contact guard assist, steadying assist  [place] wheel walker  Distance Ambulated (ft) 240 ft  Mobility Ambulated with assistance in hallway  Mobility Response Tolerated well  Mobility performed by Mobility specialist  $Mobility charge 1 Mobility   Pt received in bed and agreeable. No complaints on walk. Pulse ox read 78% but pt felt fine and when returned to bed pulse ox read 98% with RN present.   Hildred Alamin Mobility Specialist  Mobility Specialist Phone: [phone]

## 2021-01-30 NOTE — Discharge Summary (Addendum)
Discharge Summary  CAILEN MIHALIK RUE:[id number] DOB: [birth year]  PCP: [name], MD  Admit date: 01/28/2021 Discharge date: 01/30/2021  Time spent: 49mins, more than 50% time spent on coordination of care, 2 daughters updated over the phone prior to discharge  Case discussed with infectious disease Dr. Graylon Good prior to discharge  Recommendations for Outpatient Follow-up:  F/u with PCP on 11/2   for hospital discharge follow up, repeat cbc/bmp at follow up F/u with rheumatology and pulmonology as scheduled Follow-up with urology as scheduled  Only new medication at discharge is doxycycline Continue all home medications, no change to home medications  Discharge Diagnoses:  Active Hospital Problems   Diagnosis Date Noted   UTI (urinary tract infection) 01/28/2021   Sepsis secondary to UTI ([facility]) 01/29/2021   Bacteremia due to Gram-positive bacteria 01/28/2021   Bladder cancer ([facility]) 01/28/2021   Chronic pain syndrome 10/26/2017   CKD (chronic kidney disease) stage 4, GFR 15-29 ml/min ([place]) 05/17/2017   DM (diabetes mellitus) type II controlled, neurological manifestation ([place]) 05/17/2017   Rheumatoid arthritis ([facility]) 11/05/2014   Chronic diastolic heart failure ([facility]) 09/14/2011   Obstructive sleep apnea 12/17/2008   Coronary atherosclerosis 03/19/2008   Essential hypertension 09/23/2006   GERD 09/23/2006   HLD (hyperlipidemia) 09/23/2006    Resolved Hospital Problems  No resolved problems to display.    Discharge Condition: stable  Diet recommendation: heart healthy/carb modified  Filed Weights   01/29/21 0900  Weight: 85.7 kg    History of present illness: ( per admitting MD Dr Rogers Blocker) Chief Complaint: fever and positive blood cultures    HPI: [name] is a 87 y.o. male with medical history significant of bladder cancer, hypertension, OSA, coronary atherosclerosis with hx of CABG, chronic pain syndrome, chronic kidney disease stage IV, ILD< GERD &  dyphagia, chronic diastolic heart failure, J4NW, HLD, RA who presented to ED after receiving a call and telling him he had a positive blood culture and needed to come to ED. He has not had any fever since early Tuesday AM and last took a tylenol around 5pm yesterday. He does admit to right sided flank pain that started a few days ago. Pain comes and goes and rated as  4/5. Described as constant, achy. No radiation. He does have dysuria, urgency and frequency. Has suprapubic pain, but this is chronic.    He was seen yesterday in ED after he had a bladder cancer treatment (BCG) on Monday and had a fever that night to 104 so came to ED.    Seen in ED on 1025 for fever to 104 found to have what appeared to be a dirty urine blood cultures obtained and given 1 dose of Rocephin and sent home. They called out abx, but they didn't pick this up.  He was called back in due to positive blood culture for Staph hominis.    He denies any headache, vision changes, chest pain, palpitations, shortness of breath, cough, stomach pain, N/V/D. He does have chronic suprapubic pain.    ED Course: vitals: Afebrile, blood pressure 156/71, heart rate 69, respiratory rate 18, oxygen 95% on room air. Pertinent labs: Lactic acid 2.1, glucose 315, BUN 32, creatinine 1.99, UA: Large leukocytes, more than 50 WBC, rare bacteria, greater than 50 RBCs.  Chest x-ray on 10/25: No acute findings.  CT [place] pelvis,: No acute finding, improved bladder mass, chronic inguinal hernias, very large on the right with multiple internal bowel loops.  Patient given Rocephin and vancomycin  in ED.  blood cultures and urine cultures obtained. Given 500cc bolus and TRH was asked to admit.   Hospital Course:  Principal Problem:   UTI (urinary tract infection) Active Problems:   Obstructive sleep apnea   Essential hypertension   Coronary atherosclerosis   GERD   Chronic diastolic heart failure ([name])   CKD (chronic kidney disease) stage 4, GFR 15-29  ml/min ([name])   DM (diabetes mellitus) type II controlled, neurological manifestation ([name])   HLD (hyperlipidemia)   Rheumatoid arthritis ([name])   Chronic pain syndrome   Bacteremia due to Gram-positive bacteria   Bladder cancer ([name])   Sepsis secondary to UTI ([place])  Complicated UTI in the setting of bladder cancer, sepsis present on admission with leukocytosis, fever, lactic acidosis, resolved -Blood culture obtained on 10/25 1 out of 2 grew staph hominis -echocardiogram no vegetation  -Urine culture grow staph hemolyticus that sensitive to d nitrofurantoin, oxycycline and vancomycin, resistant to the rest antibiotic tested -Case discussed with infectious disease Dr. Graylon Good over the phone who advised the blood culture is likely contaminant can treat as complicated UTI, patient received vanc on 10/26, will discharge on doxycycline to finish total 7 days antibiotic treatment  Bladder cancer followed by urology received BCG treatment Monday Ct ab on presentation showed "1. No acute finding. 2. Improved bladder mass. 3. Chronic inguinal hernias, very large on the right with multiple internal bowel loops."  Stage IV CKD Renal function at baseline  Noninsulin-dependent type 2 diabetes, most recent A1c 6.9 in September 23/2022, present with hyperglycemia Hypoglycemia on presentation most likely due to infection and stress, blood glucose has improved at discharge On GLP-1 agonist at home, continue semaglutide F/u with pcp  HTN/ CAD s/p CABG/dCHF Stable at baseline, continue home meds  History of seropositive rheumatoid arthritis, mild interstitial lung disease, chronic hypoxia on home o2 No acute issues, denies sob, no cough, cxr no acute findings, does has mild bibasilar crackles on exam Follow-up with rheumatology Dr. Estanislado Pandy and pulmonology Dr. Chase Caller as scheduled in November.   Procedures: none  Consultations: Phone conversation with infectious disease Dr. Baxter Flattery on  10/28  Discharge Exam: BP (!) 144/70 (BP Location: Left Arm)   Pulse (!) 57   Temp 97.7 F (36.5 C) (Oral)   Resp 18   Wt 85.7 kg   SpO2 98%   BMI 25.62 kg/m   General: NAD, hard of hearing Cardiovascular: RRR Respiratory: mild bibasilar crackles   Discharge Instructions You were cared for by a hospitalist during your hospital stay. If you have any questions about your discharge medications or the care you received while you were in the hospital after you are discharged, you can call the unit and asked to speak with the hospitalist on call if the hospitalist that took care of you is not available. Once you are discharged, your primary care physician will handle any further medical issues. Please note that NO REFILLS for any discharge medications will be authorized once you are discharged, as it is imperative that you return to your primary care physician (or establish a relationship with a primary care physician if you do not have one) for your aftercare needs so that they can reassess your need for medications and monitor your lab values.   Allergies as of 01/30/2021       Reactions   Doxazosin Mesylate Other (See Comments)   dizziness   Methocarbamol Rash        Medication List     STOP taking  these medications    cefpodoxime 100 MG tablet Commonly known as: VANTIN       TAKE these medications    amLODipine 5 MG tablet Commonly known as: NORVASC Take 5 mg by mouth at bedtime.   aspirin EC 81 MG tablet Take 81 mg by mouth at bedtime.   carvedilol 6.25 MG tablet Commonly known as: COREG Take 6.25 mg by mouth 2 (two) times daily.   clopidogrel 75 MG tablet Commonly known as: PLAVIX Take 1 tablet (75 mg total) by mouth daily. What changed: when to take this   doxycycline 100 MG tablet Commonly known as: VIBRA-TABS Take 1 tablet (100 mg total) by mouth every 12 (twelve) hours for 5 days.   EYE HEALTH FORMULA PO Take 1 tablet by mouth daily.   furosemide  40 MG tablet Commonly known as: LASIX Take 1 tablet (40 mg total) by mouth every Monday, Wednesday, and Friday.   glucose blood test strip Commonly known as: ONE TOUCH ULTRA TEST USE TO CHECK BLOOD SUGAR ONCE A DAY Dx Code E11.40   HYDROcodone-acetaminophen 10-325 MG tablet Commonly known as: Norco Take 1-2 tablets by mouth 2 (two) times daily as needed for severe pain. Must last 30 days. What changed: Another medication with the same name was removed. Continue taking this medication, and follow the directions you see here.   losartan 25 MG tablet Commonly known as: COZAAR Take 25 mg by mouth every evening.   nitroGLYCERIN 0.4 MG SL tablet Commonly known as: NITROSTAT DISSOLVE 1 TABLET UNDER TONGUE AS NEEDEDFOR CHEST PAIN. MAY REPEAT 5 MINUTES APART 3 TIMES IF NEEDED. IF NO RELIEF CALL 911 What changed: See the new instructions.   ONE TOUCH ULTRA 2 w/Device Kit Use to obtain blood sugar daily. Dx Code V76.16   OneTouch Delica Lancets 07P Misc 1 each by In Vitro route daily. Dx Code E11.49   OXYGEN Inhale into the lungs. Per pt- Uses Oxygen 2 liters at night.   pantoprazole 40 MG tablet Commonly known as: PROTONIX TAKE 1 TABLET BY MOUTH TWICE (2) DAILY What changed: See the new instructions.   polyethylene glycol 17 g packet Commonly known as: MIRALAX / GLYCOLAX Take 17 g by mouth every other day.   pregabalin 50 MG capsule Commonly known as: Lyrica Take 1 capsule (50 mg total) by mouth at bedtime.   Rybelsus 3 MG Tabs Generic drug: Semaglutide Take 1 tablet by mouth daily.   tamsulosin 0.4 MG Caps capsule Commonly known as: FLOMAX Take 0.4 mg by mouth at bedtime.       Allergies  Allergen Reactions   Doxazosin Mesylate Other (See Comments)    dizziness   Methocarbamol Rash      The results of significant diagnostics from this hospitalization (including imaging, microbiology, ancillary and laboratory) are listed below for reference.    Significant  Diagnostic Studies: CT ABDOMEN PELVIS WO CONTRAST  Result Date: 01/27/2021 CLINICAL DATA:  Fever and weakness. Bladder cancer with recent chemotherapy EXAM: CT ABDOMEN AND PELVIS WITHOUT CONTRAST TECHNIQUE: Multidetector CT imaging of the abdomen and pelvis was performed following the standard protocol without IV contrast. COMPARISON:  09/02/2020 FINDINGS: Lower chest: Partly calcified pleural thickening in the right posterior chest. Coronary atherosclerosis Hepatobiliary: No focal liver abnormality.Cholecystectomy. No bile duct dilatation. Pancreas: Unremarkable. Spleen: Unremarkable. Adrenals/Urinary Tract: Negative adrenals. No hydronephrosis or ureteral stone. Generalized cortical atrophy. Left upper pole renal cyst measuring 4.8 cm. Thick walled bladder with bubbles of gas superiorly where there is implied bladder  wall thinning. Masslike appearance of the upper bladder is improved. Punctate bilateral renal calculi. No perforation or visible intraluminal hemorrhage. Stomach/Bowel:  No obstruction. No visible inflammation. Vascular/Lymphatic: No acute vascular abnormality. No mass or adenopathy. Reproductive:Enlarged prostate up lifting the bladder base. Other: No ascites or pneumoperitoneum. Bilateral inguinal hernia, particularly large on the right where there are numerous internal bowel loops. Musculoskeletal: No acute abnormalities. Remote T11 compression fracture. IMPRESSION: 1. No acute finding. 2. Improved bladder mass. 3. Chronic inguinal hernias, very large on the right with multiple internal bowel loops. Electronically Signed   By: Jorje Guild M.D.   On: 01/27/2021 05:14   DG Chest Port 1 View  Result Date: 01/27/2021 CLINICAL DATA:  87 year old male with possible sepsis. EXAM: PORTABLE CHEST 1 VIEW COMPARISON:  Chest x-ray 04/10/2020. FINDINGS: Lung volumes are low. No consolidative airspace disease. No pleural effusions. No pneumothorax. No pulmonary nodule or mass noted. Pulmonary  vasculature and the cardiomediastinal silhouette are within normal limits. Atherosclerotic calcifications in the thoracic aorta. Status post median sternotomy for CABG. IMPRESSION: 1. Low lung volumes without radiographic evidence of acute cardiopulmonary disease. 2. Aortic atherosclerosis. Electronically Signed   By: Vinnie Langton M.D.   On: 01/27/2021 05:10   ECHOCARDIOGRAM COMPLETE  Result Date: 01/29/2021    ECHOCARDIOGRAM REPORT   Patient Name:   CHRISTEN BEDOYA Date of Exam: 01/29/2021 Medical Rec #:  [id number]       Height:       72.0 in Accession #:    [id number]      Weight:       188.9 lb Date of Birth:  1931-09-20       BSA:          2.080 m Patient Age:    27 years        BP:           176/78 mmHg Patient Gender: M               HR:           65 bpm. Exam Location:  Inpatient Procedure: 2D Echo, Cardiac Doppler and Color Doppler Indications:    Bacteremia  History:        Patient has prior history of Echocardiogram examinations, most                 recent 04/11/2020. Aortic Valve Disease; Risk Factors:Sleep Apnea,                 Diabetes and Hypertension. CKD. Interstitial lung disease.  Sonographer:    Merrie Roof RDCS Referring Phys: [id number] [place]  1. Left ventricular ejection fraction, by estimation, is 60 to 65%. The left ventricle has normal function. The left ventricle has no regional wall motion abnormalities. Left ventricular diastolic parameters are consistent with Grade I diastolic dysfunction (impaired relaxation).  2. Right ventricular systolic function is normal. The right ventricular size is mildly enlarged. Tricuspid regurgitation signal is inadequate for assessing PA pressure.  3. The mitral valve is degenerative. No evidence of mitral valve regurgitation. No evidence of mitral stenosis.  4. The aortic valve is calcified. There is moderate calcification of the aortic valve. There is moderate thickening of the aortic valve. Aortic valve regurgitation is  not visualized. Mild to moderate aortic valve stenosis. Aortic valve area, by VTI measures 1.35 cm. Aortic valve mean gradient measures 14.0 mmHg. Aortic valve Vmax measures 2.47 m/s. Conclusion(s)/Recommendation(s): Poor windows for evaluation for endocarditis. Would recommend TEE  if clinical suspicion is high. FINDINGS  Left Ventricle: Left ventricular ejection fraction, by estimation, is 60 to 65%. The left ventricle has normal function. The left ventricle has no regional wall motion abnormalities. The left ventricular internal cavity size was normal in size. There is  no left ventricular hypertrophy. Left ventricular diastolic parameters are consistent with Grade I diastolic dysfunction (impaired relaxation). Normal left ventricular filling pressure. Right Ventricle: The right ventricular size is mildly enlarged. No increase in right ventricular wall thickness. Right ventricular systolic function is normal. Tricuspid regurgitation signal is inadequate for assessing PA pressure. Left Atrium: Left atrial size was normal in size. Right Atrium: Right atrial size was normal in size. Pericardium: There is no evidence of pericardial effusion. Mitral Valve: The mitral valve is degenerative in appearance. There is mild thickening of the mitral valve leaflet(s). There is mild calcification of the mitral valve leaflet(s). Mild to moderate mitral annular calcification. No evidence of mitral valve regurgitation. No evidence of mitral valve stenosis. Tricuspid Valve: The tricuspid valve is normal in structure. Tricuspid valve regurgitation is not demonstrated. No evidence of tricuspid stenosis. Aortic Valve: The aortic valve is calcified. There is moderate calcification of the aortic valve. There is moderate thickening of the aortic valve. Aortic valve regurgitation is not visualized. Mild to moderate aortic stenosis is present. Aortic valve mean gradient measures 14.0 mmHg. Aortic valve peak gradient measures 24.4 mmHg.  Aortic valve area, by VTI measures 1.35 cm. Pulmonic Valve: The pulmonic valve was normal in structure. Pulmonic valve regurgitation is not visualized. No evidence of pulmonic stenosis. Aorta: The aortic root is normal in size and structure. Venous: The inferior vena cava was not well visualized. IAS/Shunts: No atrial level shunt detected by color flow Doppler.  LEFT VENTRICLE PLAX 2D LVIDd:         4.50 cm   Diastology LVIDs:         3.20 cm   LV e' medial:    5.55 cm/s LV PW:         1.00 cm   LV E/e' medial:  8.6 LV IVS:        0.90 cm   LV e' lateral:   7.51 cm/s LVOT diam:     2.30 cm   LV E/e' lateral: 6.3 LV SV:         72 LV SV Index:   35 LVOT Area:     4.15 cm  RIGHT VENTRICLE RV Basal diam:  4.20 cm RV Mid diam:    3.70 cm LEFT ATRIUM             Index        RIGHT ATRIUM           Index LA diam:        3.90 cm 1.88 cm/m   RA Area:     14.90 cm LA Vol (A2C):   73.8 ml 35.49 ml/m  RA Volume:   35.20 ml  16.93 ml/m LA Vol (A4C):   62.6 ml 30.10 ml/m LA Biplane Vol: 69.1 ml 33.23 ml/m  AORTIC VALVE AV Area (Vmax):    1.49 cm AV Area (Vmean):   1.34 cm AV Area (VTI):     1.35 cm AV Vmax:           247.00 cm/s AV Vmean:          178.000 cm/s AV VTI:            0.533 m AV Peak Grad:  24.4 mmHg AV Mean Grad:      14.0 mmHg LVOT Vmax:         88.80 cm/s LVOT Vmean:        57.200 cm/s LVOT VTI:          0.173 m LVOT/AV VTI ratio: 0.32  AORTA Ao Root diam: 3.30 cm MITRAL VALVE MV Area (PHT): 3.39 cm     SHUNTS MV Decel Time: 224 msec     Systemic VTI:  0.17 m MV E velocity: 47.60 cm/s   Systemic Diam: 2.30 cm MV A velocity: 101.00 cm/s MV E/A ratio:  0.47 Fransico Him MD Electronically signed by Fransico Him MD Signature Date/Time: 01/29/2021/3:58:02 PM    Final     Microbiology: Recent Results (from the past 240 hour(s))  Blood Culture (routine x 2)     Status: None (Preliminary result)   Collection Time: 01/27/21  4:00 AM   Specimen: BLOOD LEFT ARM  Result Value Ref Range Status   Specimen  Description BLOOD LEFT ARM  Final   Special Requests   Final    BOTTLES DRAWN AEROBIC AND ANAEROBIC Blood Culture adequate volume   Culture   Final    NO GROWTH 2 DAYS Performed at [facility], [address] [place], [place] [address]    Report Status PENDING  Incomplete  Blood Culture (routine x 2)     Status: Abnormal   Collection Time: 01/27/21  4:14 AM   Specimen: BLOOD RIGHT ARM  Result Value Ref Range Status   Specimen Description BLOOD RIGHT ARM  Final   Special Requests   Final    BOTTLES DRAWN AEROBIC ONLY Blood Culture adequate volume   Culture  Setup Time   Final    GRAM POSITIVE COCCI IN CLUSTERS AEROBIC BOTTLE ONLY CRITICAL RESULT CALLED TO, READ BACK BY AND VERIFIED WITH: PHARMD EDEN BREWINGTON 01/27/2021 @1929  BY JW    Culture (A)  Final    STAPHYLOCOCCUS HOMINIS THE SIGNIFICANCE OF ISOLATING THIS ORGANISM FROM A SINGLE SET OF BLOOD CULTURES WHEN MULTIPLE SETS ARE DRAWN IS UNCERTAIN. PLEASE NOTIFY THE MICROBIOLOGY DEPARTMENT WITHIN ONE WEEK IF SPECIATION AND SENSITIVITIES ARE REQUIRED. Performed at [facility], [place] [address], [place], [place] [address]    Report Status 01/28/2021 FINAL  Final  Blood Culture ID Panel (Reflexed)     Status: Abnormal   Collection Time: 01/27/21  4:14 AM  Result Value Ref Range Status   Enterococcus faecalis NOT DETECTED NOT DETECTED Final   Enterococcus Faecium NOT DETECTED NOT DETECTED Final   Listeria monocytogenes NOT DETECTED NOT DETECTED Final   Staphylococcus species DETECTED (A) NOT DETECTED Final    Comment: CRITICAL RESULT CALLED TO, READ BACK BY AND VERIFIED WITH: PHARMD EDEN BREWINGTON 01/27/2021 @1929  BY JW    Staphylococcus aureus (BCID) NOT DETECTED NOT DETECTED Final   Staphylococcus epidermidis NOT DETECTED NOT DETECTED Final   Staphylococcus lugdunensis NOT DETECTED NOT DETECTED Final   Streptococcus species NOT DETECTED NOT DETECTED Final   Streptococcus agalactiae NOT DETECTED NOT DETECTED Final    Streptococcus pneumoniae NOT DETECTED NOT DETECTED Final   Streptococcus pyogenes NOT DETECTED NOT DETECTED Final   A.calcoaceticus-baumannii NOT DETECTED NOT DETECTED Final   Bacteroides fragilis NOT DETECTED NOT DETECTED Final   Enterobacterales NOT DETECTED NOT DETECTED Final   Enterobacter cloacae complex NOT DETECTED NOT DETECTED Final   Escherichia coli NOT DETECTED NOT DETECTED Final   Klebsiella aerogenes NOT DETECTED NOT DETECTED Final  Klebsiella oxytoca NOT DETECTED NOT DETECTED Final   Klebsiella pneumoniae NOT DETECTED NOT DETECTED Final   Proteus species NOT DETECTED NOT DETECTED Final   Salmonella species NOT DETECTED NOT DETECTED Final   Serratia marcescens NOT DETECTED NOT DETECTED Final   Haemophilus influenzae NOT DETECTED NOT DETECTED Final   Neisseria meningitidis NOT DETECTED NOT DETECTED Final   Pseudomonas aeruginosa NOT DETECTED NOT DETECTED Final   Stenotrophomonas maltophilia NOT DETECTED NOT DETECTED Final   Candida albicans NOT DETECTED NOT DETECTED Final   Candida auris NOT DETECTED NOT DETECTED Final   Candida glabrata NOT DETECTED NOT DETECTED Final   Candida krusei NOT DETECTED NOT DETECTED Final   Candida parapsilosis NOT DETECTED NOT DETECTED Final   Candida tropicalis NOT DETECTED NOT DETECTED Final   Cryptococcus neoformans/gattii NOT DETECTED NOT DETECTED Final    Comment: Performed at [facility], [place] [address] [place], [place] [address]  Resp Panel by RT-PCR (Flu A&B, Covid) Nasopharyngeal Swab     Status: None   Collection Time: 01/27/21  4:43 AM   Specimen: Nasopharyngeal Swab; Nasopharyngeal(NP) swabs in vial transport medium  Result Value Ref Range Status   SARS Coronavirus 2 by RT PCR NEGATIVE NEGATIVE Final    Comment: (NOTE) SARS-CoV-2 target nucleic acids are NOT DETECTED.  The SARS-CoV-2 RNA is generally detectable in upper respiratory specimens during the acute phase of infection. The lowest concentration of SARS-CoV-2  viral copies this assay can detect is 138 copies/mL. A negative result does not preclude SARS-Cov-2 infection and should not be used as the sole basis for treatment or other patient management decisions. A negative result may occur with  improper specimen collection/handling, submission of specimen other than nasopharyngeal swab, presence of viral mutation(s) within the areas targeted by this assay, and inadequate number of viral copies(<138 copies/mL). A negative result must be combined with clinical observations, patient history, and epidemiological information. The expected result is Negative.  Fact Sheet for Patients:  EntrepreneurPulse.com.au  Fact Sheet for Healthcare Providers:  IncredibleEmployment.be  This test is no t yet approved or cleared by the [place] FDA and  has been authorized for detection and/or diagnosis of SARS-CoV-2 by FDA under an Emergency Use Authorization (EUA). This EUA will remain  in effect (meaning this test can be used) for the duration of the COVID-19 declaration under Section 564(b)(1) of the Act, 21 U.S.C.section 360bbb-3(b)(1), unless the authorization is terminated  or revoked sooner.       Influenza A by PCR NEGATIVE NEGATIVE Final   Influenza B by PCR NEGATIVE NEGATIVE Final    Comment: (NOTE) The Xpert Xpress SARS-CoV-2/FLU/RSV plus assay is intended as an aid in the diagnosis of influenza from Nasopharyngeal swab specimens and should not be used as a sole basis for treatment. Nasal washings and aspirates are unacceptable for Xpert Xpress SARS-CoV-2/FLU/RSV testing.  Fact Sheet for Patients: EntrepreneurPulse.com.au  Fact Sheet for Healthcare Providers: IncredibleEmployment.be  This test is not yet approved or cleared by the [place] FDA and has been authorized for detection and/or diagnosis of SARS-CoV-2 by FDA under an Emergency Use Authorization  (EUA). This EUA will remain in effect (meaning this test can be used) for the duration of the COVID-19 declaration under Section 564(b)(1) of the Act, 21 U.S.C. section 360bbb-3(b)(1), unless the authorization is terminated or revoked.  Performed at [facility], [place] [address], [name], [place] [address]   Urine Culture     Status: Abnormal   Collection Time: 01/27/21  4:44 AM   Specimen: In/Out Cath Urine  Result Value Ref Range Status   Specimen Description IN/OUT CATH URINE  Final   Special Requests   Final    NONE Performed at [facility], [address], [place], [place] [address]    Culture (A)  Final    >=100,000 COLONIES/mL MULTIPLE SPECIES PRESENT, SUGGEST RECOLLECTION   Report Status 01/28/2021 FINAL  Final  Blood culture (routine single)     Status: None (Preliminary result)   Collection Time: 01/28/21 10:34 AM   Specimen: BLOOD  Result Value Ref Range Status   Specimen Description BLOOD LEFT ANTECUBITAL  Final   Special Requests   Final    BOTTLES DRAWN AEROBIC AND ANAEROBIC Blood Culture adequate volume   Culture   Final    NO GROWTH < 24 HOURS Performed at [facility], [place] [address] [place], [place] [address]    Report Status PENDING  Incomplete  Urine Culture     Status: Abnormal   Collection Time: 01/28/21 10:34 AM   Specimen: Urine, Clean Catch  Result Value Ref Range Status   Specimen Description URINE, CLEAN CATCH  Final   Special Requests   Final    Immunocompromised Performed at [facility], [place] [address] [place], [place] [address]    Culture >=100,000 COLONIES/mL STAPHYLOCOCCUS HAEMOLYTICUS (A)  Final   Report Status 01/30/2021 FINAL  Final   Organism ID, Bacteria STAPHYLOCOCCUS HAEMOLYTICUS (A)  Final      Susceptibility   Staphylococcus haemolyticus - MIC*    CIPROFLOXACIN >=8 RESISTANT Resistant     GENTAMICIN >=16 RESISTANT Resistant     NITROFURANTOIN <=16 SENSITIVE Sensitive     OXACILLIN >=4 RESISTANT  Resistant     TETRACYCLINE 2 SENSITIVE Sensitive     VANCOMYCIN 1 SENSITIVE Sensitive     TRIMETH/SULFA >=320 RESISTANT Resistant     CLINDAMYCIN >=8 RESISTANT Resistant     RIFAMPIN >=32 RESISTANT Resistant     Inducible Clindamycin NEGATIVE Sensitive     * >=100,000 COLONIES/mL STAPHYLOCOCCUS HAEMOLYTICUS     Labs: Basic Metabolic Panel: Recent Labs  Lab 01/27/21 0418 01/28/21 1034 01/29/21 0259 01/30/21 0156  NA 137 137 140 135  K 4.5 4.3 3.6 3.5  CL 104 103 103 100  CO2 $Re'24 24 26 26  'taL$ GLUCOSE 236* 315* 158* 150*  BUN 35* 32* 27* 31*  CREATININE 2.19* 1.99* 1.94* 1.99*  CALCIUM 9.6 9.6 9.8 9.4   Liver Function Tests: Recent Labs  Lab 01/27/21 0418 01/28/21 1034  AST 30 28  ALT 20 24  ALKPHOS 79 72  BILITOT 1.0 0.9  PROT 8.1 7.9  ALBUMIN 3.3* 3.2*   Recent Labs  Lab 01/27/21 0418  LIPASE 32   No results for input(s): AMMONIA in the last 168 hours. CBC: Recent Labs  Lab 01/27/21 0418 01/28/21 1034 01/29/21 0259 01/29/21 1342  WBC 18.7* 9.9 9.7 8.1  NEUTROABS 16.9* 7.5  --  6.0  HGB 12.4* 12.2* 12.8* 12.7*  HCT 39.4 38.5* 39.5 39.9  MCV 84.9 85.2 82.8 82.8  PLT 240 220 221 240   Cardiac Enzymes: No results for input(s): CKTOTAL, CKMB, CKMBINDEX, TROPONINI in the last 168 hours. BNP: BNP (last 3 results) Recent Labs    04/13/20 0614  BNP 76.9    ProBNP (last 3 results) No results for input(s): PROBNP in the last 8760 hours.  CBG: Recent Labs  Lab 01/29/21 0804 01/29/21 1159 01/29/21 1702 01/29/21 2040 01/30/21 2951  GLUCAP 163* 215* 197* 202* 161*       Signed:  Florencia Reasons MD, PhD, FACP  [facility] 01/30/2021, 10:53 AM

## 2021-01-30 NOTE — Consult Note (Signed)
[facility] CM Inpatient Consult   01/30/2021  [name] [birth year] [id number]  [place] [facility]    Patient was screened for [place] [facility] CM) post hospital services with noted extreme high risk score for unplanned readmission. According to encounter review, patient has been active in the Chronic Care Management program at primary care office, [facility].   Plan: Will continue to follow for progression.  Of note, [facility] Management services does not replace or interfere with any services that are arranged by inpatient case management or social work.   Netta Cedars, MSN, RN [facility] [employer] [phone]  Toll free office [phone]

## 2021-01-30 NOTE — Progress Notes (Signed)
OT Cancellation Note  Patient Details Name: [name] MRN: [id number] DOB: Aug 30, 1931   Cancelled Treatment:    Reason Eval/Treat Not Completed: OT screened, no needs identified, will sign off. Pt getting dressed with his wife upon arrival. Performing at baseline. No questions. All acute OT needs met and will screen/sign off.   [place], OTR/L Acute Rehab Pager: [phone] Office: [phone] 01/30/2021, 12:43 PM

## 2021-02-01 LAB — CULTURE, BLOOD (ROUTINE X 2)
Culture: NO GROWTH
Special Requests: ADEQUATE

## 2021-02-02 ENCOUNTER — Telehealth: Payer: Self-pay | Admitting: Rheumatology

## 2021-02-02 ENCOUNTER — Telehealth: Payer: Self-pay

## 2021-02-02 LAB — CULTURE, BLOOD (SINGLE)
Culture: NO GROWTH
Special Requests: ADEQUATE

## 2021-02-02 NOTE — Telephone Encounter (Signed)
Transition Care Management Follow-up Telephone Call Date of discharge and from where: 01/30/21 from [facility] How have you been since you were released from the hospital? Patient states he is feeling better. Any questions or concerns? No  Items Reviewed: Did the pt receive and understand the discharge instructions provided? Yes  Medications obtained and verified? Yes  Other? No  Any new allergies since your discharge? No  Dietary orders reviewed? Yes Do you have support at home? Yes   Home Care and Equipment/Supplies: Were home health services ordered? no If so, what is the name of the agency? N/A  Has the agency set up a time to come to the patient's home? not applicable Were any new equipment or medical supplies ordered?  No What is the name of the medical supply agency? N/A Were you able to get the supplies/equipment? not applicable Do you have any questions related to the use of the equipment or supplies? No  Functional Questionnaire: (I = Independent and D = Dependent) ADLs: I with some assistance  Bathing/Dressing- I with some assistance   Meal Prep-  wife prepares meals  Eating- I  Maintaining continence- I  Transferring/Ambulation- I with assistance using a cane or walker  Managing Meds- I with some assistance from wife  Follow up appointments reviewed:  PCP Hospital f/u appt confirmed? Yes  Scheduled to see Dr. Wilhemena Durie on 02/04/21 @ 12:15pm. [facility] f/u appt confirmed? Yes  Scheduled to see S. Dr. Estanislado Pandy  on 02/06/21 @ 10:45am. Are transportation arrangements needed? No  If their condition worsens, is the pt aware to call PCP or go to the Emergency Dept.? Yes Was the patient provided with contact information for the PCP's office or ED? Yes Was to pt encouraged to call back with questions or concerns? Yes

## 2021-02-02 NOTE — Telephone Encounter (Signed)
Attempted to contact patient and left message for patient to call the office.  

## 2021-02-02 NOTE — Telephone Encounter (Signed)
Patient's daughter rescheduled patient's appointment because patient was just released from hospital, and too weak to come for appointment. Daughter questions if patient is on any medication from [place] for his arthritis? Please call to advise.

## 2021-02-03 NOTE — Telephone Encounter (Signed)
[name] returned call to the office and I advised her we do not have Danuel on any medication at this time.

## 2021-02-03 NOTE — Telephone Encounter (Signed)
Attempted to contact patient and left message for patient to call the office.  

## 2021-02-04 ENCOUNTER — Encounter: Payer: Self-pay | Admitting: Internal Medicine

## 2021-02-04 ENCOUNTER — Other Ambulatory Visit: Payer: Self-pay

## 2021-02-04 ENCOUNTER — Ambulatory Visit (INDEPENDENT_AMBULATORY_CARE_PROVIDER_SITE_OTHER): Payer: Medicare Other | Admitting: Internal Medicine

## 2021-02-04 DIAGNOSIS — N390 Urinary tract infection, site not specified: Secondary | ICD-10-CM | POA: Diagnosis not present

## 2021-02-04 DIAGNOSIS — E1142 Type 2 diabetes mellitus with diabetic polyneuropathy: Secondary | ICD-10-CM

## 2021-02-04 DIAGNOSIS — N184 Chronic kidney disease, stage 4 (severe): Secondary | ICD-10-CM

## 2021-02-04 DIAGNOSIS — C679 Malignant neoplasm of bladder, unspecified: Secondary | ICD-10-CM | POA: Diagnosis not present

## 2021-02-04 NOTE — Assessment & Plan Note (Signed)
Last GFR ~30 and pretty stable No reason to recheck this now

## 2021-02-04 NOTE — Assessment & Plan Note (Signed)
Done with BCG Getting cysto next week to assess response to Rx

## 2021-02-04 NOTE — Assessment & Plan Note (Signed)
Lab Results  Component Value Date   HGBA1C 6.9 (A) 12/26/2020   Not able to get the low dose semaglutide--but working through patient assistance Sugars acceptable for now Not many other choices Would accept A1c even up to 9% in this 87 year old to avoid more medication

## 2021-02-04 NOTE — Assessment & Plan Note (Signed)
Had UTI after procedure for BCG treatment False positive blood culture Now finishing up the doxycycline

## 2021-02-04 NOTE — Progress Notes (Signed)
Subjective:    Patient ID: [name], male    DOB: Jul 14, 1931, 87 y.o.   MRN: [id number]  HPI Here with daughter for hospital follow up This visit occurred during the SARS-CoV-2 public health emergency.  Safety protocols were in place, including screening questions prior to the visit, additional usage of staff PPE, and extensive cleaning of exam room while observing appropriate contact time as indicated for disinfecting solutions.   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     aspirin EC 81 MG tablet Take 81 mg by mouth at bedtime.     Blood Glucose Monitoring Suppl (ONE TOUCH ULTRA 2) w/Device KIT Use to obtain blood sugar daily. Dx Code E11.40 1 kit 0   carvedilol (COREG) 6.25 MG tablet Take 6.25 mg by mouth 2 (two) times daily.     clopidogrel (PLAVIX) 75 MG tablet Take 1 tablet (75 mg total) by mouth daily. (Patient taking differently: Take 75 mg by mouth at bedtime.) 90 tablet 3   doxycycline (VIBRA-TABS) 100 MG tablet Take 1 tablet (100 mg total) by mouth every 12 (twelve) hours for 5 days. 10 tablet 0   glucose blood (ONE TOUCH ULTRA TEST) test strip USE TO CHECK BLOOD SUGAR ONCE A DAY Dx Code E11.40 100 each 3    HYDROcodone-acetaminophen (NORCO) 10-325 MG tablet Take 1-2 tablets by mouth 2 (two) times daily as needed for severe pain. Must last 30 days. 120 tablet 0   losartan (COZAAR) 25 MG tablet Take 25 mg by mouth every evening.     Multiple Vitamins-Minerals (ICAPS PO) Take by mouth.     nitroGLYCERIN (NITROSTAT) 0.4 MG SL tablet DISSOLVE 1 TABLET UNDER TONGUE AS NEEDEDFOR CHEST PAIN. MAY REPEAT 5 MINUTES APART 3 TIMES IF NEEDED. IF NO RELIEF CALL 911 (Patient taking differently: Place 0.4 mg under the tongue every 5 (five) minutes as needed for chest pain.) 25 tablet 0   OneTouch Delica Lancets 58P MISC 1 each by In Vitro route daily. Dx Code E11.49 100 each 3   OXYGEN Inhale into the lungs. Per pt- Uses Oxygen 2 liters at night.     pantoprazole (PROTONIX) 40 MG tablet TAKE 1 TABLET BY MOUTH TWICE (2) DAILY (Patient taking differently: Take 40 mg by mouth 2 (two) times daily.) 180 tablet 3   polyethylene glycol (MIRALAX / GLYCOLAX) 17 g packet Take 17 g by mouth every other day.     pregabalin (LYRICA) 50 MG capsule Take 1 capsule (50 mg total) by mouth at bedtime. 90 capsule 2   tamsulosin (FLOMAX) 0.4 MG CAPS capsule Take 0.4 mg by mouth at bedtime.     furosemide (LASIX) 40 MG tablet Take 1 tablet (40 mg total) by mouth  every Monday, Wednesday, and Friday. 12 tablet 0   Semaglutide (RYBELSUS) 3 MG TABS Take 1 tablet by mouth daily. (Patient not taking: Reported on 02/04/2021) 30 tablet 11   No current facility-administered medications on file prior to visit.    Allergies  Allergen Reactions   Doxazosin Mesylate Other (See Comments)    dizziness   Methocarbamol Rash    Past Medical History:  Diagnosis Date   Arthritis    osteoarthritis, s/p R TKR, and digits   CAD (coronary artery disease)    a. s/p CABG (2001)  b. s/p DES to RCA and cutting POBA to ostial PDA (2013)   c. s/p DES to SVG to OM2 (01/14/14) d. cath: 08/2015 NSTEMI w/ patent LIMA-LAD and 99% stenosis of SVG-OM w/ DES placed.  CTO of SVG-RCA and SVG-D1.    Cataract    Chronic diastolic CHF (congestive heart failure) ([place])    a) 09/13 ECHO- LVEF 27-25%, grade 1 diastolic dysfunction, mild LA dilatation, atrial septal aneurysm, AV mobility restricted, but no sig AS by doppler; b) 09/04/08 ECHO- LVH, ef 60%, mild AS, c. echo 08/2015: EF perserved of 55-60% with inferolateral HK. Mild AS noted.   Chronic kidney disease, stage III (moderate) ([name])    Chronic lower back pain    Colon polyps    COVID-19    Diverticulosis    Dyspnea 2009 since July -Sept   05/06/08-CPST-  normal effort, reduced VO2 max 20.5 /65%, reduced at 8.2/ 40%, normal breathing resetvca of 55%, submaximal heart rate response 112/77%, flattened o2 pluse response at peak exercise-12 ml/beat @ 85%, No VQ mismatch abnormalities, All c/w CIRC Limitation   Enlarged prostate    Esophageal stricture    a. s/p dilation spring 2010   GERD (gastroesophageal reflux disease)    Heart murmur    Hiatal hernia    History of carpal tunnel syndrome    Bilateral   History of kidney stones    History of PFTs    mixed pattern on spiro. mild restn on lung volumes with near normal DLCO. Pattern can be explained by CABG scar. Fev1 2.2L/73%, ratio 68 (67), TLC 4.7/68%,RV 1.5L/55%,DLCO 79%   Hyperlipidemia    Hypertension    Interstitial lung disease ([name])    NOS   Iron deficiency anemia    Nausea & vomiting    2018/2019   On home oxygen therapy    2 L [place] at bedtime   Osteoporosis    Overweight (BMI 25.0-29.9)    BMI 29   Peripheral neuropathy    RA (rheumatoid arthritis) ([place])    [name]   Seropositive rheumatoid arthritis ([place])    Type II diabetes mellitus ([place])    diet controlled   Wears glasses    Wears partial dentures    upper    Past Surgical History:  Procedure Laterality Date   BALLOON DILATION N/A 09/12/2018   Procedure: BALLOON DILATION;  Surgeon: Irene Shipper, MD;  Location: [facility];  Service: Endoscopy;  Laterality: N/A;   BALLOON  DILATION N/A 02/20/2020   Procedure: BALLOON DILATION;  Surgeon: Irene Shipper, MD;  Location: [facility];  Service: Endoscopy;  Laterality: N/A;   BOTOX INJECTION N/A 09/12/2018   Procedure: BOTOX INJECTION;  Surgeon: Irene Shipper, MD;  Location: [facility];  Service: Endoscopy;  Laterality: N/A;   BOTOX INJECTION N/A 11/27/2018   Procedure: BOTOX INJECTION;  Surgeon: Irene Shipper, MD;  Location: [facility];  Service: Endoscopy;  Laterality:  N/A;   BOTOX INJECTION N/A 02/20/2020   Procedure: BOTOX INJECTION;  Surgeon: Irene Shipper, MD;  Location: [facility];  Service: Endoscopy;  Laterality: N/A;   CARDIAC CATHETERIZATION  08/2004   CP- no MI, Cath- small vessell disease    CARDIAC CATHETERIZATION  12/31/2011   80% distal LM, 100% native LAD, LCx and RCA, 30% prox SVG-OM, SVG-D1 normal, 99% distal, 80% ostial SVG-RCA distal to graft, LIMA-LAD normal; LVEF mildly decreased with posterior basal AK    CARDIAC CATHETERIZATION  2009   with patent grafts/notes 12/31/2011   CARDIAC CATHETERIZATION N/A 08/13/2015   Procedure: Left Heart Cath and Cors/Grafts Angiography;  Surgeon: Sherren Mocha, MD;  Location: [facility];  Service: Cardiovascular;  Laterality: N/A;   CATARACT EXTRACTION W/ INTRAOCULAR LENS  IMPLANT, BILATERAL Bilateral    CHOLECYSTECTOMY OPEN  11/2003   [name]   CORONARY ANGIOPLASTY WITH STENT PLACEMENT  01/03/2012   Successful DES to SVG-RCA and cutting balloon angioplasty ostial  PDA    CORONARY ANGIOPLASTY WITH STENT PLACEMENT  01/14/2014   "1"   CORONARY ARTERY BYPASS GRAFT  11/1999   CABG X5   CORONARY STENT PLACEMENT  02/2012   1 stent and balloon   ESOPHAGEAL DILATION  11/27/2018   Procedure: ESOPHAGEAL DILATION;  Surgeon: Irene Shipper, MD;  [facility]: [facility];  Service: Endoscopy;;   ESOPHAGOGASTRODUODENOSCOPY N/A 03/01/2017   Procedure: ESOPHAGOGASTRODUODENOSCOPY (EGD);  Surgeon: Irene Shipper, MD;  Location: [facility];  Service: Endoscopy;   Laterality: N/A;   ESOPHAGOGASTRODUODENOSCOPY (EGD) WITH ESOPHAGEAL DILATION  2010   ESOPHAGOGASTRODUODENOSCOPY (EGD) WITH PROPOFOL N/A 09/12/2018   Procedure: ESOPHAGOGASTRODUODENOSCOPY (EGD) WITH PROPOFOL;  Surgeon: Irene Shipper, MD;  [facility];  Service: Endoscopy;  Laterality: N/A;   ESOPHAGOGASTRODUODENOSCOPY (EGD) WITH PROPOFOL N/A 11/27/2018   Procedure: ESOPHAGOGASTRODUODENOSCOPY (EGD) WITH PROPOFOL, WITH BALLOON DILATION;  Surgeon: Irene Shipper, MD;  [facility]: [facility];  Service: Endoscopy;  Laterality: N/A;   ESOPHAGOGASTRODUODENOSCOPY (EGD) WITH PROPOFOL N/A 02/20/2020   Procedure: ESOPHAGOGASTRODUODENOSCOPY (EGD) WITH PROPOFOL;  Surgeon: Irene Shipper, MD;  [facility]: [facility];  Service: Endoscopy;  Laterality: N/A;   HAND SURGERY     bilateral carpal tunnel releases   JOINT REPLACEMENT     KNEE ARTHROSCOPY Right 2008   LEFT AND RIGHT HEART CATHETERIZATION WITH CORONARY ANGIOGRAM  12/31/2011   Procedure: LEFT AND RIGHT HEART CATHETERIZATION WITH CORONARY ANGIOGRAM;  Surgeon: Burnell Blanks, MD;  Location: [facility] CATH LAB;  Service: Cardiovascular;;   LEFT AND RIGHT HEART CATHETERIZATION WITH CORONARY ANGIOGRAM N/A 01/14/2014   Procedure: LEFT AND RIGHT HEART CATHETERIZATION WITH CORONARY ANGIOGRAM;  Surgeon: Peter M Martinique, MD;  Location: [facility] CATH LAB;  Service: Cardiovascular;  Laterality: N/A;   MALONEY DILATION  03/01/2017   Procedure: Venia Minks DILATION;  Surgeon: Irene Shipper, MD;  Location: [facility];  Service: Endoscopy;;   PERCUTANEOUS CORONARY INTERVENTION-BALLOON ONLY  01/03/2012   Procedure: PERCUTANEOUS CORONARY INTERVENTION-BALLOON ONLY;  Surgeon: Peter M Martinique, MD;  Location: [facility] CATH LAB;  Service: Cardiovascular;;   PERCUTANEOUS CORONARY STENT INTERVENTION (PCI-S)  12/31/2011   Procedure: PERCUTANEOUS CORONARY STENT INTERVENTION (PCI-S);  Surgeon: Burnell Blanks, MD;  Location: [facility] C[facility] LAB;  Service: Cardiovascular;;   PERCUTANEOUS  CORONARY STENT INTERVENTION (PCI-S) N/A 01/03/2012   Procedure: PERCUTANEOUS CORONARY STENT INTERVENTION (PCI-S);  Surgeon: Peter M Martinique, MD;  Location: [facility] CATH LAB;  Service: Cardiovascular;  Laterality: N/A;   SHOULDER ARTHROSCOPY WITH OPEN ROTATOR CUFF REPAIR AND DISTAL CLAVICLE ACROMINECTOMY Left 02/27/2013  Procedure: LEFT SHOULDER ARTHROSCOPY WITH MINI OPEN ROTATOR CUFF REPAIR AND SUBACROMIAL DECOMPRESSION AND DISTAL CLAVICLE RESECTION;  Surgeon: [name], MD;  Location: [place];  Service: Orthopedics;  Laterality: Left;   TOTAL KNEE ARTHROPLASTY Right 03/2010   [name]   TRANSURETHRAL RESECTION OF BLADDER TUMOR WITH MITOMYCIN-C N/A 10/08/2020   Procedure: TRANSURETHRAL RESECTION OF BLADDER TUMOR WITH GEMCITABINE;  Surgeon: Robley Fries, MD;  [facility]: [facility];  Service: Urology;  Laterality: N/A;  75 MINS   TRIGGER FINGER RELEASE Left 02/27/2013   Procedure: RELEASE TRIGGER FINGER/A-1 PULLEY;  Surgeon: [name], MD;  Location: [place];  Service: Orthopedics;  Laterality: Left;    Family History  Problem Relation Age of Onset   COPD Mother    Heart disease Father    Heart attack Father    Stomach cancer Brother    Stroke Sister    Alcohol abuse Sister    Colon cancer Brother 69   Diabetes Brother    Rectal cancer Neg Hx     Social History   Socioeconomic History   Marital status: Married    Spouse name: Not on file   Number of children: 3   Years of education: Not on file   Highest education level: Not on file  Occupational History   Occupation: Designer, jewellery: RETIRED    Comment: retired  Tobacco Use   Smoking status: Former    Packs/day: 1.00    Years: 20.00    Pack years: 20.00    Types: Cigarettes    Quit date: 04/06/1963    Years since quitting: 57.8   Smokeless tobacco: Former  Scientific laboratory technician Use: Never used  Substance and Sexual Activity   Alcohol use: No    Alcohol/week: 0.0 standard drinks    Comment: 01/01/2012 "last  alcohol ~ 50 yr ago"   Drug use: No   Sexual activity: Not Currently  Other Topics Concern   Not on file  Social History Narrative   No living will   Requests wife as health care POA-- alternate is daughter Hassan Rowan   Discussed DNR --he requests this (done 08/29/12)   Not sure about feeding tube---but might accept for some time   Patient lives with wife and daughter in a one story home.  Has 3 children.  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   Social Determinants of Health   Financial Resource Strain: Low Risk    Difficulty of Paying Living Expenses: Not very hard  Food Insecurity: Not on file  Transportation Needs: Not on file  Physical Activity: Not on file  Stress: Not on file  Social Connections: Not on file  Intimate Partner Violence: Not on file   Review of Systems No N/V Appetite is okay     Objective:   Physical Exam Constitutional:      Appearance: Normal appearance.  Cardiovascular:     Rate and Rhythm: Normal rate and regular rhythm.     Heart sounds: No murmur heard.   No gallop.  Pulmonary:     Effort: Pulmonary effort is normal.     Breath sounds: No wheezing.     Comments: Mild bibasilar crackles Abdominal:     Palpations: Abdomen is soft.     Tenderness: There is no abdominal tenderness.  Musculoskeletal:     Cervical back: Neck supple.  Lymphadenopathy:     Cervical: No cervical adenopathy.  Skin:    Findings:  No rash.  Neurological:     Mental Status: He is alert.  Psychiatric:        Mood and Affect: Mood normal.        Behavior: Behavior normal.           Assessment & Plan:

## 2021-02-05 ENCOUNTER — Encounter: Payer: Self-pay | Admitting: Podiatry

## 2021-02-05 ENCOUNTER — Ambulatory Visit: Payer: Medicare Other | Admitting: Podiatry

## 2021-02-05 DIAGNOSIS — B351 Tinea unguium: Secondary | ICD-10-CM

## 2021-02-05 DIAGNOSIS — M79675 Pain in left toe(s): Secondary | ICD-10-CM | POA: Diagnosis not present

## 2021-02-05 DIAGNOSIS — L84 Corns and callosities: Secondary | ICD-10-CM

## 2021-02-05 DIAGNOSIS — M216X2 Other acquired deformities of left foot: Secondary | ICD-10-CM

## 2021-02-05 DIAGNOSIS — N184 Chronic kidney disease, stage 4 (severe): Secondary | ICD-10-CM

## 2021-02-05 DIAGNOSIS — M79674 Pain in right toe(s): Secondary | ICD-10-CM | POA: Diagnosis not present

## 2021-02-05 DIAGNOSIS — E1142 Type 2 diabetes mellitus with diabetic polyneuropathy: Secondary | ICD-10-CM

## 2021-02-05 NOTE — Progress Notes (Signed)
This patient returns to my office for at risk foot care.  This patient requires this care by a professional since this patient will be at risk due to having neuropathy, CKD and diabetes.  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     This patient has a painful callus on the outside ball of his left foot.  No self treatment or professional care was noted. Patient also requests nail care today. This patient presents for at risk foot care today. ° °General Appearance  Alert, conversant and in no acute stress. ° °Vascular  Dorsalis pedis and posterior tibial  pulses are weakly  palpable  bilaterally.  Capillary return is within normal limits  bilaterally. Cold feet bilaterally. Absent digital hair  B/L. ° °Neurologic  Senn-Weinstein monofilament wire test absent  bilaterally. Muscle power within normal limits bilaterally. ° °Nails Thick disfigured discolored nails with subungual debris  from hallux to fifth toes bilaterally. No evidence of bacterial infection or drainage bilaterally. ° °Orthopedic  No limitations of motion  feet .  No crepitus or effusions noted.  No bony pathology or digital deformities noted. Plantar flexed fifth metatarsal left foot. ° °Skin  normotropic skin with no porokeratosis noted bilaterally.  No signs of infections or ulcers noted.   Callus sub 5th metatarsal left foot. ° °Callus due to plantarflexed fifth metatarsal left foot.  Onychomycosis ° °Consent was obtained for treatment procedures.   Debridement of callus with # 15 blade.  Debride nails with nail nipper followed by dremel usage. ° ° °Return office visit    12 weeks                Told patient to return for periodic foot care and evaluation due to potential at risk complications. ° ° °Placido Hangartner DPM   °

## 2021-02-06 ENCOUNTER — Ambulatory Visit: Payer: Medicare Other | Admitting: Rheumatology

## 2021-02-06 DIAGNOSIS — Z8669 Personal history of other diseases of the nervous system and sense organs: Secondary | ICD-10-CM

## 2021-02-06 DIAGNOSIS — Z8639 Personal history of other endocrine, nutritional and metabolic disease: Secondary | ICD-10-CM

## 2021-02-06 DIAGNOSIS — Z96651 Presence of right artificial knee joint: Secondary | ICD-10-CM

## 2021-02-06 DIAGNOSIS — I5032 Chronic diastolic (congestive) heart failure: Secondary | ICD-10-CM

## 2021-02-06 DIAGNOSIS — M25511 Pain in right shoulder: Secondary | ICD-10-CM

## 2021-02-06 DIAGNOSIS — Z8719 Personal history of other diseases of the digestive system: Secondary | ICD-10-CM

## 2021-02-06 DIAGNOSIS — Z79899 Other long term (current) drug therapy: Secondary | ICD-10-CM

## 2021-02-06 DIAGNOSIS — M059 Rheumatoid arthritis with rheumatoid factor, unspecified: Secondary | ICD-10-CM

## 2021-02-06 DIAGNOSIS — Z8679 Personal history of other diseases of the circulatory system: Secondary | ICD-10-CM

## 2021-02-06 DIAGNOSIS — M47816 Spondylosis without myelopathy or radiculopathy, lumbar region: Secondary | ICD-10-CM

## 2021-02-06 DIAGNOSIS — M79601 Pain in right arm: Secondary | ICD-10-CM

## 2021-02-06 DIAGNOSIS — M1712 Unilateral primary osteoarthritis, left knee: Secondary | ICD-10-CM

## 2021-02-06 DIAGNOSIS — N184 Chronic kidney disease, stage 4 (severe): Secondary | ICD-10-CM

## 2021-02-06 DIAGNOSIS — J849 Interstitial pulmonary disease, unspecified: Secondary | ICD-10-CM

## 2021-02-09 DIAGNOSIS — C674 Malignant neoplasm of posterior wall of bladder: Secondary | ICD-10-CM | POA: Diagnosis not present

## 2021-02-10 ENCOUNTER — Telehealth: Payer: Self-pay

## 2021-02-10 NOTE — Chronic Care Management (AMB) (Addendum)
Chronic Care Management Pharmacy Assistant   Name: [name]  MRN: [id number] DOB: [birth year]  Reason for Encounter: Diabetes Disease State   Recent office visits:  02/04/21-PCP-Patient presented for follow up UTI follow up hospital stay.Finished with BCG,finished antibiotics,Cysto next week.follow up 3 months  Recent consult visits:  02/17/21- Ophthalmology-[facility]-Patient presented for diabetic eye exam.No retinopathy. 02/05/21-Podiatry-Patient presented for callus debridement. No medication changes   Hospital visits:  01/28/21 thru 01/30/21-[facility]-Patient presented for results of positive blood culture after bladder cancer treatment.He was called back in due to positive blood culture for Staph hominis. Patient given Rocephin and vancomycin in ED.  blood cultures and urine cultures obtained.Given 500cc bolus and TRH was asked to admit.    Medications: Outpatient Encounter Medications as of 02/10/2021  Medication Sig   amLODipine (NORVASC) 5 MG tablet Take 5 mg by mouth at bedtime.   aspirin EC 81 MG tablet Take 81 mg by mouth at bedtime.   Blood Glucose Monitoring Suppl (ONE TOUCH ULTRA 2) w/Device KIT Use to obtain blood sugar daily. Dx Code E11.40   carvedilol (COREG) 6.25 MG tablet Take 6.25 mg by mouth 2 (two) times daily.   clopidogrel (PLAVIX) 75 MG tablet Take 1 tablet (75 mg total) by mouth daily. (Patient taking differently: Take 75 mg by mouth at bedtime.)   furosemide (LASIX) 40 MG tablet Take 1 tablet (40 mg total) by mouth every Monday, Wednesday, and Friday.   glucose blood (ONE TOUCH ULTRA TEST) test strip USE TO CHECK BLOOD SUGAR ONCE A DAY Dx Code E11.40   losartan (COZAAR) 25 MG tablet Take 25 mg by mouth every evening.   Multiple Vitamins-Minerals (ICAPS PO) Take by mouth.   nitroGLYCERIN (NITROSTAT) 0.4 MG SL tablet DISSOLVE 1 TABLET UNDER TONGUE AS NEEDEDFOR CHEST PAIN. MAY REPEAT 5 MINUTES APART 3 TIMES IF NEEDED. IF NO RELIEF CALL 911  (Patient taking differently: Place 0.4 mg under the tongue every 5 (five) minutes as needed for chest pain.)   OneTouch Delica Lancets 33G MISC 1 each by In Vitro route daily. Dx Code E11.49   OXYGEN Inhale into the lungs. Per pt- Uses Oxygen 2 liters at night.   pantoprazole (PROTONIX) 40 MG tablet TAKE 1 TABLET BY MOUTH TWICE (2) DAILY (Patient taking differently: Take 40 mg by mouth 2 (two) times daily.)   polyethylene glycol (MIRALAX / GLYCOLAX) 17 g packet Take 17 g by mouth every other day.   pregabalin (LYRICA) 50 MG capsule Take 1 capsule (50 mg total) by mouth at bedtime.   Semaglutide (RYBELSUS) 3 MG TABS Take 1 tablet by mouth daily. (Patient not taking: Reported on 02/04/2021)   tamsulosin (FLOMAX) 0.4 MG CAPS capsule Take 0.4 mg by mouth at bedtime.   No facility-administered encounter medications on file as of 02/10/2021.      Recent Relevant Labs: Lab Results  Component Value Date/Time   HGBA1C 6.9 (A) 12/26/2020 12:42 PM   HGBA1C 7.9 (A) 09/19/2020 01:37 PM   HGBA1C 8.9 (H) 04/11/2020 03:17 AM   HGBA1C 8.0 (H) 06/13/2019 10:57 AM   MICROALBUR 7.5 (H) 04/28/2018 12:28 PM   MICROALBUR 0.5 02/04/2009 10:09 AM    Kidney Function Lab Results  Component Value Date/Time   CREATININE 1.99 (H) 01/30/2021 01:56 AM   CREATININE 1.94 (H) 01/29/2021 02:59 AM   CREATININE 2.28 (H) 06/24/2017 11:36 AM   CREATININE 2.43 (H) 05/27/2017 02:09 PM   GFR 29.52 (L) 10/29/2020 03:08 PM   GFRNONAA 32 (  L) 01/30/2021 01:56 AM   GFRNONAA 26 (L) 06/24/2017 11:36 AM   GFRAA 30 (L) 06/24/2017 11:36 AM     Contacted patient on 02/20/21 to discuss diabetes disease state.   Current antihyperglycemic regimen:    No pharmacotherapy  at this time   What diet changes have been made to improve diabetes control? No diet changes, the patient reports he walks 3 times a day and writes down the steps he takes.  What recent interventions/DTPs have been made to improve glycemic control:     12/26/20-PCP-Better without Rybelsus  Have there been any recent hospitalizations or ED visits since last visit with CPP? Yes 01/28/21 thru 01/30/21-[facility]-Patient presented for results of positive blood culture after bladder cancer treatment. He was called back in due to positive blood culture for Staph hominis. Patient given Rocephin and vancomycin in ED.  blood cultures and urine cultures obtained.Given 500cc bolus and TRH was asked to admit.    Patient denies hypoglycemic symptoms, including Pale, Sweaty, Shaky, Hungry, Nervous/irritable, and Vision changes  Patient denies hyperglycemic symptoms, including blurry vision, excessive thirst, fatigue, polyuria, and weakness  How often are you checking your blood sugar?  Several times a week   What are your blood sugars ranging? The patient only had 2 readings to report Fasting:  178,187  During the week, how often does your blood glucose drop below 70? Never  Are you checking your feet daily/regularly? Yes  Adherence Review: Is the patient currently on a STATIN medication? No Is the patient currently on ACE/ARB medication? Yes Does the patient have >5 day gap between last estimated fill dates? No According to pharmacy, refill on losartan 25mg   is ready for pick up now.  Care Gaps: Annual wellness visit in last year? Yes Most recent A1C reading:6.9  12/26/20 Most Recent BP reading:104/60  67-P 02/04/21  Last eye exam / retinopathy screening:02/17/21 Last diabetic foot exam:12/26/20  Counseled patient on importance of annual eye and foot exam. The patient is up to date   Star Rating Drugs:  Medication:  Last Fill: [facility] Supply Losartan 25mg  10/27/20 90      per [place], losartan 25mg  is ready for pick up now.  Cardiology appointment on 03/04/21 and Pulmonology appointment with on 02/20/21  [name], CPP notified  Avel Sensor, [place] Assistant [phone]  I have reviewed the  care management and care coordination activities outlined in this encounter and I am certifying that I agree with the content of this note. No further action required.  [name], PharmD Clinical Pharmacist [facility] [phone]

## 2021-02-12 ENCOUNTER — Other Ambulatory Visit: Payer: Self-pay | Admitting: Urology

## 2021-02-15 DIAGNOSIS — J8410 Pulmonary fibrosis, unspecified: Secondary | ICD-10-CM | POA: Diagnosis not present

## 2021-02-15 DIAGNOSIS — R0689 Other abnormalities of breathing: Secondary | ICD-10-CM | POA: Diagnosis not present

## 2021-02-17 DIAGNOSIS — E119 Type 2 diabetes mellitus without complications: Secondary | ICD-10-CM | POA: Diagnosis not present

## 2021-02-17 DIAGNOSIS — H353221 Exudative age-related macular degeneration, left eye, with active choroidal neovascularization: Secondary | ICD-10-CM | POA: Diagnosis not present

## 2021-02-17 DIAGNOSIS — Z961 Presence of intraocular lens: Secondary | ICD-10-CM | POA: Diagnosis not present

## 2021-02-17 DIAGNOSIS — H353213 Exudative age-related macular degeneration, right eye, with inactive scar: Secondary | ICD-10-CM | POA: Diagnosis not present

## 2021-02-20 ENCOUNTER — Other Ambulatory Visit: Payer: Self-pay

## 2021-02-20 ENCOUNTER — Encounter: Payer: Self-pay | Admitting: Internal Medicine

## 2021-02-20 ENCOUNTER — Ambulatory Visit (INDEPENDENT_AMBULATORY_CARE_PROVIDER_SITE_OTHER): Payer: Medicare Other | Admitting: Internal Medicine

## 2021-02-20 VITALS — BP 130/70 | HR 69 | Temp 97.5°F | Ht 72.0 in | Wt 191.4 lb

## 2021-02-20 DIAGNOSIS — J849 Interstitial pulmonary disease, unspecified: Secondary | ICD-10-CM | POA: Diagnosis not present

## 2021-02-20 NOTE — Progress Notes (Signed)
DOB: 03-17-1932, 87 y.o.   MRN: [id number]  HPI  87 yo male former smoker with RA-ILD   TEST     - Coronary artery disease. s/p CABG x 5 in 2002.  Dyspnea started following lopressor Jan-July 2009 and lisinopril increae July 2009. No relief despite stopping agents July 2010  -   Myoview on April 17, 2007 was  nonischemic with an EF of 51% and inferobasal wall scar.   - CAth Oct 2009:    - . Severe native three-vessel coronary artery disease. 2. Status post multivessel coronary bypass surgery with all grafts  patent. Normal LVEF and LVEDP - PFT - Mixed pattern on spiro. Mild restn on lung volumeswith near normal DLCO.   - Fev1 2.2L/73%, FVC 3.21L/70%, Ratio 68 (67),  TLC 4.7L/68%, RV 1.5L/55%, DLCO 79%.  -CPST 05/06/2008: Normal effot.    - Reduced VO2 max 20.5/65%, REduced AT 8.2/40%, Normal breathing reservce of 55%, submaximal heart rate response 112/77%,  flattened O2 pulse response at peak exercise - 71m/beat at 85%. No VQ mismatch abnormalities. - ECHO 09/04/2008: LVH, ef 60%, mild AS and unchanged from prior   - CT chest   - Non specific Interstitial Lung disease NOS. Stable pulm infilratates RLL ggo > LLL ggo. 03/2008 -> 10/2008 -> June 2011: stable - Rehab: never attended 2009-2013 due to cost co.  2013 Walking desat test : resting 98/94% -> 3 laps x 185 feet; HR 113/pulse ox 96%    PFTs July 2013 show worsening restriction since 2010 along with reduced diffusion. The RLL baseline GGO might be worse in July 2013 compared to 2011. In addition, autommune profile shows VERY HIGH TITERS of CCP antibody > 300 which is pathognomonic of rheumtoid arthritis lung involvement. RF is only borderline elevated a 23  PFT 10/12/11>>FEV1 2.02 (70%), Fvc 2.8/72%, TLC 4.07 (59%), DLCO 62%, no BD: RESTRICTION - WORSE since 2010 -(2010:  Fev1 2.2L/73%, FVC 3.21L/70%, Ratio 68 (67),  TLC 4.7L/68%, RV 1.5L/55%, DLCO 79%)   Echo 05/04/11>>mild LVH, EF 55 to 667% grade 1 diastolic dysfx, mild AS,  mild MR, PAS 34 mmHg  CT 11/02/11 - calcification suspicious for aortic valve and some GGO esp RLL > LLL (? RLL worse compared to 2011)   [place] dLM 80%, LAD 100%, circumflex 100%, RCA 100%, proximal SVG-OM 30%, SVG-D1 ok, SVG-RCA 99% distal and 80% ostial PDA distal to graft, LIMA-LAD ok with some left to right collaterals.  PCI 01/03/12: Promus DES to the SVG-RCA and Cutting Balloon angioplasty to the ostial PDA.  Echocardiogram 12/31/11: EF 534-19% grade 1 diastolic dysfunction, mild aortic stenosis, mean gradient 9, mild LAE.   Walking desaturation test on 05/05/2012 185 feet x 3 laps:  did NOT desaturate. Rest pulse ox was 98%, final pulse ox was 96%. HR response 69/min at rest to 82/min at peak exertion.   PFT FVC fev1 ratio BD fev1 TLC DLCO comment intervention  2010 3.2L/70% 2.2L/73%   4.7L/68% 79%    10/12/11 2.8L/72% 2.02L/70% 72  4.07L/59% 62% Worsening restriction since 2010 Rheum will start immuran/pred. Also had PCI in nov 2013  04/28/12 2.6L/59% 1.77L/62% 67  4.0L/59% 15.7/71%     '2015 PFT showed no significant change with FEV1 56% , ratio 66,  FVC 61%.   07/31/2015 Follow up : ILD ? RA related  Pt returns for follow up . Has been have more DOE.  Was set up for a CT chest  PFT on 4/25 showed FEV1  57%, ratio 71, FVC 58%, no sign BD response , + BD in midflows.  DLCO 40%. (this is decreased from 2015- was 59%.  TLC is similar to 2015 . Has been started on Imuran for RA ILD .  CT chest did not show any sgin change , cont Bilateral LL GGO and bronchiectasis .  Remains on O2 2l/m  At bedtime  . ONO showed desats on RA , O2 was continued.  Has been seen by [facility] last year. Now on Imuran, feels Arthritis pain is better.  Says he is feels he is doing okay, gets winded with walking long distances.  Can walk 1/2 block before stopping.  Has dry cough some days.  PVX and Prevnar 13 are utd.   Followed by Dr. Estil Daft in Rheum. Remains on Imuran. Most pain is in his hands.   Denies  chest pain, orthopnea , edema or fever.    OV 12/09/2015   Chief Complaint  Patient presents with   Follow-up    Feels about the same since the last time we saw him, cough, with some mucus    Follow-up refractory dyspnea in the setting of mild interstitial lung disease from rheumatoid arthritis and coronary artery disease and multifactorial reasons. Refractory to pulmonary rehabilitation.  He did see my nurse practitioner April 2017 and CT chest did not show any changes to his bilateral lower lobe groundglass opacities and bronchiectasis. He is being maintained on Imuran for his rheumatoid arthritis. He is on oxygen 2 L at bedtime.     Had echocardiogram May 2017 that shows elevated pulmonary artery systolic pressure, mild aortic stenosis and diastolic dysfunction. On 08/13/2015 he underwent left heart catheterization by Dr. Sherren Mocha that shows severe native three-vessel coronary artery disease status post bypass with continued patency of the LIMA to LAD and continued patency of the saphenous vein graft to OM with severe stenosis in the proximal body of the graft for which he had successful PCI of a drug-eluting stent treatment of in-stent restenosis. Recommendation is dual antiplatelet therapy lifelong.   He is with his wife currently. He says dyspnea is only some better. He still has significant dyspnea and faitgues more than his wife.   OV 06/07/2016  Chief Complaint  Patient presents with   Follow-up    Pt feels like his breathing has not been doing good, pt [name]/o coughing more with greyish colored mucus, more increase sob with exertion, has some chest tightnes, does has some congestion Denies fever   Follow-up refractory dyspnea in the setting of rheumatoid arthritis andand coronary artery disease. Multifactorial dyspnea. On Imuran for rheumatoid arthritis oxygen 2 L at bedtime. He is refractory to pulmonary rehabilitation. He is believed to have some interstitial lung disease but  April 2017 CT chest suggested only mild bronchiectasis  Last seen in September 2017 and at that time as dyspnea with some better after having a cardiac stent. I discussed with his cardiologist about the possibility of having right heart catheterization but it was felt that the elevation in pulmonary artery pressure was not significant enough on the echocardiogram to warrant this. He tells me that he is deteriorated now and this shortness of breath is even worse. He has tried Symbicort in the past without much help. He is willing to try "anything" to help his dyspnea. Walking desaturation test on 06/07/2016 185 feet x 3 laps on RA:  did NOT desaturate. Rest pulse ox was 97%, final pulse ox was 95%. Did only 2 laps  and stopped in interim x 2. . HR response 64/min at rest to 70/min at peak exertion. He says he stopped due to dyspnea and chest pain - like always       OV 08/03/2016  Chief Complaint  Patient presents with   Follow-up    Pt here after HRCT. Pt states he tried the trelegy and states it helped his breathing. Pt denies cough, CP/tightness, and f/[name]/s.     Refractory dyspnea in this gentleman with cardiac disease and also mixed obstructive restrictive spirometry due to air trapping and bronchiectasis and some groundglass.  Last visit I tried him on a sample of triple inhaler therapy. He says that this only worked temporarily and stop working. Nevertheless he wants another inhaler. He is frustrated by his dyspnea. He and his wife are making for relief from dyspnea. In 2016 he visited [facility] and they were considering switching his Imuran to CellCept in order to improve his pulmonary parenchymal problems. He is open to that. However he is not sure he needs follow-up with [facility] because Dr Deeann Dowse left the practice there. The suggestion of mild elevation in pulmonary artery systolic pressures and a previous echocardiogram but it was not significant enough to warrant cardiology to do  right heart catheterization. He is willing to have right heart catheterization if recommended. Overall frustrated. Pulm function test does not show any change in 2 years. And CT scan itself in 2018 appears to be stable    IMPRESSION: 1. Stable mild-to-moderate cylindrical and varicoid bronchiectasis in the bilateral dependent lower lobes. 2. Continued stability of parenchymal banding with associated subpleural reticulation and ground-glass attenuation at the areas of bronchiectasis in the dependent and basilar lower lobes, most consistent with postinfectious/postinflammatory scarring. No findings to suggest interstitial lung disease. 3. Stable smooth pleural thickening and calcification in the dependent basilar right pleural space, most compatible with chronic postinflammatory change. No pleural effusions. 4. Stable mild patchy air trapping in both lungs, suggesting small airways disease. 5. Aortic atherosclerosis. Left main and 3 vessel coronary atherosclerosis status post CABG.      OV 01/31/2017  Chief Complaint  Patient presents with   Follow-up    Pt states that he has been sick x5 months with nausea and vomiting. [name]/o SOB with exertion, prod. cough with gray mucus, and occ. CP when SOB.    87 year old male with rheumatoid arthritis and interstitial lung disease. He has refractory dyspnea class III on exertion relieved by rest. He also has associated exertional chest pain.at last visit I had his rheumatologist Dr. D evaluate him in switching his Imuran to CellCeptwhich she did in 11/18/2016. He is on low-dose methotrexate was reduced GFR. He is tolerating the medication well. He complains of nausea and vomiting but this preceded the CellCept and Bactrim by 2 months.he is scheduled for an endoscopy in November 2018. He does not change because of the CellCept and Bactrim.Most recent lab 12/26/2016 fairly stable except mild reduction in hemoglobin. Dr. Keturah Barre has referred him back to  primary care physician.he continues to be overwhelmed by his dyspnea and also associated chest pain with exertion. Was recently saw cardiology and had a stable echocardiogram and EKG according to his history. Cardiologist reassured him in his view of the chest pain is not associated with cardiac disease. He therefore is insisting to me that this is because of lung disease. Walking desaturation test 185 feet 3 laps on room air with a forehead probe: Resting pulse ox 99%. Final pulse ox  98%. Resting heart rate 80/m. Final heart rate 98 a minute. He did get dyspneic with chest pain as always. This no change in his oxygen desaturation test._0  The only changes on the last 3 weeks is complaining of increased cough congestion in his chestand a grayish sputum that is different from from baseline.   OV 04/29/2017  Chief Complaint  Patient presents with   Follow-up    Pt states his SOB has become worse and coughing with gray mucus. Pt states abx he was placed on last visit did not clear up symptoms. Cellcept and bactrim were both stopped Tuesday, 04/26/17 pt believes.   Follow-up multifactorial refractory dyspnea in the setting of interstitial lung disease related to rheumatoid arthritis  He has been on CellCept for a while associated with Bactrim.  Earlier this week he saw a rheumatologist Dr. Keturah Barre.  He complained of significant nausea.  She called me we spoke.  Because of lack of improvement with dyspnea with the CellCept regimen recommended by [facility].  We decided to discontinue this.  At the same time was having nausea and there was concern this was related to CellCept/Bactrim.  Today he tells me that since stopping CellCept/Bactrim nausea and vomiting have significantly improved.  He still feels fatigued.  His cardiologist now has it on a monitor because of his refractory dyspnea.  He is frustrated by his dyspnea which he says is exertional and associated with central chest pain.  There are no other new  issues.  Recent review of the labs show that he has been anemic early in January 2019 and his chronic kidney disease is getting worse.  The status of these as of today is not known.  Next blood work is only in a month from now/   Walking desaturation test on 04/29/2017 185 feet x 3 laps on ROOM AIR:  did NOT desaturate. Rest pulse ox was 98%, final pulse ox was 95%. HR response 69/min at rest to 94/min at peak exertion. Patient [name]  Did not Desaturate < 88% . [name] yes did  Desaturated </= 3% points. [name] yes did get tachyardic. DID get dyspneic with central chest pain - just like before   OV 08/01/2017    Chief Complaint  Patient presents with   Follow-up    Pt states he is the same as he was at the last visit, maybe some better. Pt still becomes SOB with exertion, some mild coughing with gray mucus, and occ. chest tightness.   Follow-up multifactorial refractory dyspnea in the setting of interstitial lung disease related to rheumatoid arthritis   87 year old male with rheumatoid arthritis interstitial lung disease and also severe coronary artery disease on medical management.  He is here for follow-up.  There is a 17-monthfollow-up.  In the interim [facility].  His exertional chest pain is somewhat better and his dyspnea somewhat better but he still does have baseline exertional dyspnea and chest pain that is refractory to any treatment.  He is no longer on CellCept or Bactrim for his interstitial lung disease after developing nausea.  He has been off immune modulator treatment for 3 months.  He did see Dr. D rheumatologist end of March 2019 and she was wondering about restarting his old Imuran.  At this point in time he is still going through anemia work-up.  He has a colonoscopy upcoming.  He will have Plavix on hold prior to the colonoscopy.  At this point in time his walking  desaturation test is stable and documented below.  There are no other new issues.  Walking desaturation test on 08/01/2017 185 feet x 3 laps on ROOM AIR:  did not desaturate. Rest pulse ox was 98%, final pulse ox was 98%. HR response 67/min at rest to 86/min at peak exertion. Patient [name]  Did not Desaturate < 88% . [name] did not  Desaturated </= 3% points. [name] did not get tachyardic.  DID get dyspneic with central chest pain - just like before   Results for [name], [name] (MRN [id number]) as of 08/01/2017 11:25  Ref. Range 06/24/2017 11:36  Creatinine Latest Ref Range: 0.70 - 1.11 mg/dL 2.28 (H)    Results for [name], [name] (MRN [id number]) as of 08/01/2017 11:25  Ref. Range 06/24/2017 11:36  WBC Latest Ref Range: 3.8 - 10.8 Thousand/uL 7.8   OV 01/17/2019  Subjective:  Patient ID: [name], male , DOB: [birth year] , age 79 y.o. , MRN: [id number] , AD[address]ESS: [address] [place] [address] Follow-up multifactorial refractory dyspnea in the setting of interstitial lung disease related to rheumatoid arthritis  01/17/2019 -   Chief Complaint  Patient presents with   ILD (interstitial lung disease)    Feels breathing has not improved. Will take a couple minutes for breathing to get back to normal if he gets short of breath for the last six months.     HPI [name] 87 y.o. -follow-up multifactorial dyspnea setting of  interstitial lung disease secondary to rheumatoid arthritis.  Also refractory dyspnea.  Last seen in April 2019.  Since then have not seen him.  At the last visit we recommended he go back on Imuran but review of the rheumatology notes from January 10, 2019 looks like he is not on any immunosuppressive's.  This because he said previous side effects of the GI tract from both Imuran and CellCept.  With the onset of the pandemic he said he is not eating at restaurants anymore and therefore he had and his wife have lost a lot of  weight.  Despite this loss of weight his shortness of breath he says is worse.  Of note every visit he comes he has progressive dyspnea.  The symptom score itself shows the dyspnea is not bad.  He continues to use 2 L of nasal cannula oxygen at night.  Last CT scan of the chest was in 2018 and last PFT was in 2017.      SYMPTOM SCALE - ILD 01/17/2019   O2 use  only 2 L at night  Shortness of Breath 0 -> 5 scale with 5 being worst (score 6 If unable to do)  At rest 0  Simple tasks - showers, clothes change, eating, shaving 2.5  Household (dishes, doing bed, laundry) 3  Shopping 2  Walking level at own pace 2  Walking keeping up with others of same age 36.5  Walking up Stairs 4  Walking up Hill 4  Total (40 - 48) Dyspnea Score 21  How bad is your cough? 0  How bad is your fatigue 0    Results for [name], [name] (MRN [id number]) as of 01/17/2019 12:01  Ref. Range 09/18/2013 11:28 07/29/2015 11:12  FVC-Pre Latest Units: L 2.57 2.52  FVC-%Pred-Pre Latest Units: % 61 56    Results for [name], [name] (  MRN [id number]) as of 01/17/2019 12:01  Ref. Range 09/18/2013 11:28 07/29/2015 11:12  DLCO unc Latest Units: ml/min/mmHg 19.91 14.87  DLCO unc % pred Latest Units: % 59 40  IMPRESSION: 1. Stable mild-to-moderate cylindrical and varicoid bronchiectasis in the bilateral dependent lower lobes. 2. Continued stability of parenchymal banding with associated subpleural reticulation and ground-glass attenuation at the areas of bronchiectasis in the dependent and basilar lower lobes, most consistent with postinfectious/postinflammatory scarring. No findings to suggest interstitial lung disease. 3. Stable smooth pleural thickening and calcification in the dependent basilar right pleural space, most compatible with chronic postinflammatory change. No pleural effusions. 4. Stable mild patchy air trapping in both lungs, suggesting small airways disease. 5. Aortic atherosclerosis. Left main and 3  vessel coronary atherosclerosis status post CABG.     Electronically Signed   By: Ilona Sorrel M.D.   On: 07/08/2016 14:11  IMPRESSION: 1. Stable mild-to-moderate cylindrical and varicoid bronchiectasis in the bilateral dependent lower lobes. 2. Continued stability of parenchymal banding with associated subpleural reticulation and ground-glass attenuation at the areas of bronchiectasis in the dependent and basilar lower lobes, most consistent with postinfectious/postinflammatory scarring. No findings to suggest interstitial lung disease. 3. Stable smooth pleural thickening and calcification in the dependent basilar right pleural space, most compatible with chronic postinflammatory change. No pleural effusions. 4. Stable mild patchy air trapping in both lungs, suggesting small airways disease. 5. Aortic atherosclerosis. Left main and 3 vessel coronary atherosclerosis status post CABG.     Electronically Signed   By: Ilona Sorrel M.D.   On: 07/08/2016 14:11  ROS - per HPI  Results for VENKAT, ANKNEY (MRN [id number]) as of 01/17/2019 12:01  Ref. Range 04/28/2018 12:28  Hemoglobin Latest Ref Range: 13.0 - 17.0 g/dL 15.1   Results for [name], [name] (MRN [id number]) as of 01/17/2019 12:01  Ref. Range 04/28/2018 12:28  Creatinine Latest Ref Range: 0.40 - 1.50 mg/dL 2.24 (H)   2020 -   Chief Complaint  Patient presents with   Follow-up    PFT performed 10/26 and CT performed 11/3.  Pt states that his breathing is the same as last visit and is still become SOB with activities.   Follow-up multifactorial dyspnea in the setting of rheumatoid arthritis associated with mild interstitial lung disease v cylindrical bronchiectasis and air trapping  HPI [name] 87 y.o. -last visit was approximately 3 weeks ago.  Given his worsening shortness of breath I asked him to do a high-resolution CT chest and also simple walking desaturation test this visit and also pulmonary function  test.  He is here to review that.  He is here with his wife.  He now tells me that his shortness of breath is not as bad as he told me it was at the last visit.  Nevertheless it is present on and off with exertion but relieved by rest.  His pulmonary function test shows continued stability over the last 3 years.  His high-resolution CT chest that I personally visualized shows very mild ILD and some air trapping.  He says inhalers have not helped him in the past.  There are no other new issues.  He continues to maintain a lean body weight.  He uses 2 L of oxygen at night.  He is not interested in any more testing at this point.     SYMPTOM SCALE - ILD 01/17/2019  02/12/2019   O2 use  only 2 L at night 2L at [employer] of Breath  0 -> 5 scale with 5 being worst (score 6 If unable to do)   At rest 0   Simple tasks - showers, clothes change, eating, shaving 2.5   Household (dishes, doing bed, laundry) 3   Shopping 2   Walking level at own pace 2   Walking keeping up with others of same age 59.5   Walking up Stairs 4   Walking up Hill 4   Total (40 - 48) Dyspnea Score 21   How bad is your cough? 0   How bad is your fatigue 0     Results for BLAKELEY, MARGRAF (MRN [id number]) as of 02/12/2019 11:44  Ref. Range 09/18/2013 11:28 07/29/2015 11:12 01/29/2019 13:35  FVC-Pre Latest Units: L 2.57 2.52 2.46  FVC-%Pred-Pre Latest Units: % 61 56 59   Results for STEN, DEMATTEO (MRN [id number]) as of 02/12/2019 11:44  Ref. Range 09/18/2013 11:28 07/29/2015 11:12 01/29/2019 13:35  DLCO unc Latest Units: ml/min/mmHg 19.91 14.87 18.48  DLCO unc % pred Latest Units: % 59 40 73     Simple office walk 185 feet x  3 laps goal with forehead probe 02/12/2019   O2 used RA  Number laps completed 3  Comments about pace nl  Resting Pulse Ox/HR 99% and 60/min  Final Pulse Ox/HR 98% and 80/min  Desaturated </= 88% no  Desaturated <= 3% points no  Got Tachycardic >/= 90/min no  Symptoms at end of test mild   Miscellaneous comments x     xxxxxxxxxxxxxxxxxxxxxxxxxxxxxxxxxxxxxxx CLINICAL DATA:  Chronic shortness of breath. Evaluate for interstitial lung disease.   EXAM: CT CHEST WITHOUT CONTRAST   TECHNIQUE: Multidetector CT imaging of the chest was performed following the standard protocol without intravenous contrast. High resolution imaging of the lungs, as well as inspiratory and expiratory imaging, was performed.   COMPARISON:  07/08/2016 and 07/08/2015.   FINDINGS: Cardiovascular: Atherosclerotic calcification of the aorta and aortic valve. Heart size normal. No pericardial effusion.   Mediastinum/Nodes: No pathologically enlarged mediastinal or axillary lymph nodes. Hilar regions are difficult to definitively evaluate without IV contrast. Esophagus is grossly unremarkable.   Lungs/Pleura: Linear scarring in the subpleural posterior right upper lobe. Mild cylindrical bronchiectasis. Negative for subpleural reticulation, traction bronchiectasis/bronchiolectasis, ground-glass, architectural distortion or honeycombing. Areas of subpleural scarring in the right lower lobe. Probable tiny calcified granuloma in the left lower lobe. There is air trapping. No pleural fluid. Pleural calcifications in the posterior right hemithorax. Debris is seen dependently in the upper trachea. Airway is otherwise unremarkable.   Upper Abdomen: Visualized portions of the liver, gallbladder and right adrenal gland are unremarkable. Low-attenuation lesion off the upper pole left kidney measures 4.3 cm, incompletely imaged. Visualized portions of the spleen, pancreas, stomach and bowel are grossly unremarkable. Cholecystectomy. No upper abdominal adenopathy.   Musculoskeletal: Degenerative changes in the spine. No worrisome lytic or sclerotic lesions. Flowing anterior osteophytosis in the thoracic spine.   IMPRESSION: 1. Cylindrical bronchiectasis without evidence of fibrotic interstitial  lung disease. 2. Air trapping is indicative of small airways disease. 3.  Aortic atherosclerosis (ICD10-170.0).     Electronically Signed   By: Lorin Picket M.D.   On: 02/06/2019 16:00   ROS - per HPI    OV 02/26/2020  Subjective:  Patient ID: [name], male , DOB: November 20, 1931 , age 61 y.o. , MRN: [id number] , [address]ESS: [address] [place] [address] PCP [name], MD Patient Care Team: [name], MD as PCP -  General [name], MD as PCP - Cardiology (Cardiology) [name], [facility] as Pharmacist (Pharmacist)  This Provider for this visit: Treatment Team:  Attending Provider: Brand Males, MD    02/26/2020 -   Chief Complaint  Patient presents with   Follow-up    ILD, SOB still the same    Follow-up rheumatoid arthritis with mixed restrictive/obstructive lung function slightly low DLCO and significant shortness of breath  -High-resolution CT chest mild ILD (also has velcro crackles at lung base) with air trapping according to Dr. Chase Caller indeterminate/alternative pattern   -Cylindrical bronchiectasis without evidence of fibrotic lung disease per radiology  Out of proportion dyspnea   HPI [name] 87 y.o. - -presents for 1 year follow-up.  He tells me that I have grown old along with him in the last 10 years have been following him.  He is now 87 years old.  He is maintaining his weight.  He continues to have out of proportion dyspnea but he admits is not any worse.  His last pulmonary function test and CT scan was a year ago and it shows stability.  There are no new issues.  He has seen rheumatologist who is then advised that he see me as well.  He continues his oxygen at night.  His symptom score shows stability.  His walking desaturation test shows stability.  He did get a bit dyspneic.  I did note the symptom score is not fully reliable.  He plans to get his Covid booster vaccine.    IMPRESSION: 1.  Cylindrical bronchiectasis without evidence of fibrotic interstitial lung disease. 2. Air trapping is indicative of small airways disease. 3.  Aortic atherosclerosis (ICD10-170.0).     Electronically Signed   By: Lorin Picket M.D.   On: 02/06/2019 16:00   ROS  OV 02/20/2021  Subjective:  Patient ID: [name], male , DOB: [birth year] , age 14 y.o. , MRN: [id number] , ADDRESS: [address] [place] [address] PCP [name], MD Patient Care Team: [name], MD as PCP - General [name], MD as PCP - Cardiology (Cardiology) [name], [facility] as Pharmacist (Pharmacist)  This Provider for this visit: Treatment Team:  Attending Provider: Brand Males, MD    02/20/2021 -   Chief Complaint  Patient presents with   Follow-up    Pt states his breathing has gotten worse since last visit. States that he has an occ cough.    Follow-up rheumatoid arthritis with mixed restrictive/obstructive lung function slightly low DLCO and significant shortness of breath  -High-resolution CT chest mild ILD (also has velcro crackles at lung base) with air trapping according to Dr. Chase Caller indeterminate/alternative pattern   -Cylindrical bronchiectasis without evidence of fibrotic lung disease per radiology  Out of proportion dyspnea  HPI [name] 87 y.o. -presents for follow-up.  He presents with his wife and daughter.  Meeting daughter I think for the first time or after a long time.  He again tells me that as always that his dyspnea is getting worse.  He is also more physically deconditioned.  Daughter states he is mostly sedentary does not do any household work.  He does things around the house.  They all agree that the dyspnea is definitely worse.  Daughter wondered if any of the medicines can directly contribute to dyspnea.  I reviewed the medications patient does not have Brilinta but on Plavix.  He is on antihypertensives.  He review of  his medical chart indicates that he has diastolic dysfunction, arctic stenosis, anemia, chronic kidney disease - -all of which can contribute to dyspnea.  He also has anemia.  In addition he is more progressively physically deconditioned his albumin is now less than 4 and he is on protein calorie malnutrition.  He when he walks around the house he uses oxygen for relief but he never desaturates.  This for subjectively.  He continues with nighttime oxygen.  I explained that sometimes for palliative relief of dyspnea morphine would be indicated.  They are not enthused about this option.  At this point in time we will get more work-up done.  Did explain to the multifactorial nature of dyspnea can be hard to solve.    CT Chest data  No results found.    SYMPTOM SCALE - ILD 01/17/2019  02/26/2020 197# 02/20/2021 191#  O2 use  only 2 L at night At night   Shortness of Breath 0 -> 5 scale with 5 being worst (score 6 If unable to do)    At rest 0 0 0  Simple tasks - showers, clothes change, eating, shaving 2.5 0 3  Household (dishes, doing bed, laundry) 3 0 na  Shopping 2 0 na  Walking level at own pace 2 0 3  Walking keeping up with others of same age 57.5 Does not do Does not do  Walking up Stairs 4 Does not do Does not do  Walking up [place] 4 Does not do Does notot  Total (40 - 48) Dyspnea Score 21  6  How bad is your cough? 0 none 0  How bad is your fatigue 0 none 2  nausea   0  vomit   0  diarreh   0  anxiety   0  depression   0  0   Simple office walk 185 feet x  3 laps goal with forehead probe 02/26/2020  02/20/2021   O2 used ra ra  Number laps completed 3 Did only 1.5 of intended 3 laps  Comments about pace slow slow  Resting Pulse Ox/HR 97% and 62/min 100% and 67  Final Pulse Ox/HR 95% and 80/min 98% and 74  Desaturated </= 88% no no  Desaturated <= 3% points no no  Got Tachycardic >/= 90/min no no  Symptoms at end of test Dyspnea, 2nd lap Moderate dyspnea and stopped   Miscellaneous comments x      PFT  PFT Results Latest Ref Rng & Units 01/29/2019 07/29/2015 09/18/2013  FVC-Pre L 2.46 2.52 2.57  FVC-Predicted Pre % 59 56 61  FVC-Post L 2.51 2.60 2.60  FVC-Predicted Post % 60 58 61  Pre FEV1/FVC % % 67 66 66  Post FEV1/FCV % % 69 71 70  FEV1-Pre L 1.64 1.67 1.69  FEV1-Predicted Pre % 56 52 56  FEV1-Post L 1.74 1.84 1.81  DLCO uncorrected ml/min/mmHg 18.48 14.87 19.91  DLCO UNC% % 73 40 59  DLCO corrected ml/min/mmHg - - 19.91  DLCO COR %Predicted % - - 59  DLVA Predicted % 118 77 107  TLC L 5.48 4.81 4.70  TLC % Predicted % 73 62 64  RV % Predicted % 104 77 72     Latest Reference Range & Units 09/29/20 13:16 10/29/20 15:08 01/27/21 04:18 01/28/21 10:34 01/29/21 02:59 01/29/21 13:42  Hemoglobin 13.0 - 17.0 g/dL 13.0 12.2 (L) 12.4 (L) 12.2 (L) 12.8 (L) 12.7 (L)  (L): Data is abnormally low   Latest Reference Range &  Units 09/29/20 13:16 10/29/20 15:08 01/27/21 04:18 01/28/21 10:34 01/29/21 02:59 01/30/21 01:56  Creatinine 0.61 - 1.24 mg/dL 1.91 (H) 2.00 (H) 2.19 (H) 1.99 (H) 1.94 (H) 1.99 (H)  (H): Data is abnormally high  Latest Reference Range & Units 10/17/06 08:29 04/21/07 08:28 10/23/07 11:53 08/02/08 09:46 02/04/09 10:09 07/16/09 11:18 03/23/10 11:30 10/14/10 09:15 04/27/11 11:53 08/17/11 09:37 09/09/11 18:24 12/31/11 02:49 04/06/12 23:34 02/19/13 10:38 12/25/14 10:13 03/17/15 11:43 02/18/16 12:40 04/08/16 14:34 05/11/16 12:33 08/03/16 09:27 08/17/16 11:34 08/25/16 15:48 10/19/16 08:52 10/26/16 09:52 11/02/16 13:50 11/10/16 10:30 11/23/16 10:28 04/29/17 12:29 10/17/17 11:51 04/28/18 12:28 06/13/19 10:57 10/29/20 15:08 01/27/21 04:18 01/28/21 10:34  Albumin 3.5 - 5.0 g/dL 4.0 4.0 4.0 3.8 3.9 4.1 3.9 4.4 4.0 3.8 3.8 3.7 3.7 3.7 4.0 4.0 4.0 4.2 3.9 3.6 4.2 4.1 3.7 3.9 4.0 3.9 3.8 4.2 4.1 4.1 4.1 4.1 4.1 3.7 3.3 (L) 3.2 (L)  (L): Data is abnormally low  ECHO 01/29/21  Sonographer:    Merrie Roof RDCS  Referring Phys: [id number] [place]     1. Left ventricular ejection fraction, by estimation, is 60 to 65%. The  left ventricle has normal function. The left ventricle has no regional  wall motion abnormalities. Left ventricular diastolic parameters are  consistent with Grade I diastolic  dysfunction (impaired relaxation).   2. Right ventricular systolic function is normal. The right ventricular  size is mildly enlarged. Tricuspid regurgitation signal is inadequate for  assessing PA pressure.   3. The mitral valve is degenerative. No evidence of mitral valve  regurgitation. No evidence of mitral stenosis.   4. The aortic valve is calcified. There is moderate calcification of the  aortic valve. There is moderate thickening of the aortic valve. Aortic  valve regurgitation is not visualized. Mild to moderate aortic valve  stenosis. Aortic valve area, by VTI  measures 1.35 cm. Aortic valve mean gradient measures 14.0 mmHg. Aortic  valve Vmax measures 2.47 m/s.   Conclusion(s)/Recommendation(s): Poor windows for evaluation for  endocarditis. Would recommend TEE if clinical suspicion is high.    has a past medical history of Arthritis, CAD (coronary artery disease), Cataract, Chronic diastolic CHF (congestive heart failure) ([place]), Chronic kidney disease, stage III (moderate) ([place]), Chronic lower back pain, Colon polyps, COVID-19, Diverticulosis, Dyspnea (2009 since July -Sept), Enlarged prostate, Esophageal stricture, GERD (gastroesophageal reflux disease), Heart murmur, Hiatal hernia, History of carpal tunnel syndrome, History of kidney stones, History of PFTs, Hyperlipidemia, Hypertension, Interstitial lung disease ([place]), Iron deficiency anemia, Nausea & vomiting, On home oxygen therapy, Osteoporosis, Overweight (BMI 25.0-29.9), Peripheral neuropathy, RA (rheumatoid arthritis) ([place]), Seropositive rheumatoid arthritis ([place]), Type II diabetes mellitus ([place]), Wears glasses, and Wears partial dentures.    reports that he quit smoking about 57 years ago. His smoking use included cigarettes. He has a 20.00 pack-year smoking history. He has quit using smokeless tobacco.  Past Surgical History:  Procedure Laterality Date   BALLOON DILATION N/A 09/12/2018   Procedure: BALLOON DILATION;  Surgeon: Irene Shipper, MD;  Location: [facility];  Service: Endoscopy;  Laterality: N/A;   BALLOON DILATION N/A 02/20/2020   Procedure: BALLOON DILATION;  Surgeon: Irene Shipper, MD;  [facility];  Service: Endoscopy;  Laterality: N/A;   BOTOX INJECTION N/A 09/12/2018   Procedure: BOTOX INJECTION;  Surgeon: Irene Shipper, MD;  [facility];  Service: Endoscopy;  Laterality: N/A;   BOTOX INJECTION N/A 11/27/2018   Procedure: BOTOX INJECTION;  Surgeon: Irene Shipper, MD;  [facility];  Service: Endoscopy;  Laterality: N/A;   BOTOX INJECTION N/A 02/20/2020   Procedure: BOTOX INJECTION;  Surgeon: Irene Shipper, MD;  [facility];  Service: Endoscopy;  Laterality: N/A;   CARDIAC CATHETERIZATION  08/2004   CP- no MI, Cath- small vessell disease    CARDIAC CATHETERIZATION  12/31/2011   80% distal LM, 100% native LAD, LCx and RCA, 30% prox SVG-OM, SVG-D1 normal, 99% distal, 80% ostial SVG-RCA distal to graft, LIMA-LAD normal; LVEF mildly decreased with posterior basal AK    CARDIAC CATHETERIZATION  2009   with patent grafts/notes 12/31/2011   CARDIAC CATHETERIZATION N/A 08/13/2015   Procedure: Left Heart Cath and Cors/Grafts Angiography;  Surgeon: Sherren Mocha, MD;  Location: [facility];  Service: Cardiovascular;  Laterality: N/A;   CATARACT EXTRACTION W/ INTRAOCULAR LENS  IMPLANT, BILATERAL Bilateral    CHOLECYSTECTOMY OPEN  11/2003   [name]   CORONARY ANGIOPLASTY WITH STENT PLACEMENT  01/03/2012   Successful DES to SVG-RCA and cutting balloon angioplasty ostial  PDA    CORONARY ANGIOPLASTY WITH STENT PLACEMENT  01/14/2014   "1"   CORONARY ARTERY BYPASS GRAFT  11/1999    CABG X5   CORONARY STENT PLACEMENT  02/2012   1 stent and balloon   ESOPHAGEAL DILATION  11/27/2018   Procedure: ESOPHAGEAL DILATION;  Surgeon: Irene Shipper, MD;  [facility];  Service: Endoscopy;;   ESOPHAGOGASTRODUODENOSCOPY N/A 03/01/2017   Procedure: ESOPHAGOGASTRODUODENOSCOPY (EGD);  Surgeon: Irene Shipper, MD;  [facility];  Service: Endoscopy;  Laterality: N/A;   ESOPHAGOGASTRODUODENOSCOPY (EGD) WITH ESOPHAGEAL DILATION  2010   ESOPHAGOGASTRODUODENOSCOPY (EGD) WITH PROPOFOL N/A 09/12/2018   Procedure: ESOPHAGOGASTRODUODENOSCOPY (EGD) WITH PROPOFOL;  Surgeon: Irene Shipper, MD;  [facility];  Service: Endoscopy;  Laterality: N/A;   ESOPHAGOGASTRODUODENOSCOPY (EGD) WITH PROPOFOL N/A 11/27/2018   Procedure: ESOPHAGOGASTRODUODENOSCOPY (EGD) WITH PROPOFOL, WITH BALLOON DILATION;  Surgeon: Irene Shipper, MD;  [facility];  Service: Endoscopy;  Laterality: N/A;   ESOPHAGOGASTRODUODENOSCOPY (EGD) WITH PROPOFOL N/A 02/20/2020   Procedure: ESOPHAGOGASTRODUODENOSCOPY (EGD) WITH PROPOFOL;  Surgeon: Irene Shipper, MD;  [facility];  Service: Endoscopy;  Laterality: N/A;   HAND SURGERY     bilateral carpal tunnel releases   JOINT REPLACEMENT     KNEE ARTHROSCOPY Right 2008   LEFT AND RIGHT HEART CATHETERIZATION WITH CORONARY ANGIOGRAM  12/31/2011   Procedure: LEFT AND RIGHT HEART CATHETERIZATION WITH CORONARY ANGIOGRAM;  Surgeon: Burnell Blanks, MD;  Location: [facility] CATH LAB;  Service: Cardiovascular;;   LEFT AND RIGHT HEART CATHETERIZATION WITH CORONARY ANGIOGRAM N/A 01/14/2014   Procedure: LEFT AND RIGHT HEART CATHETERIZATION WITH CORONARY ANGIOGRAM;  Surgeon: Peter M Martinique, MD;  Location: [facility] CATH LAB;  Service: Cardiovascular;  Laterality: N/A;   MALONEY DILATION  03/01/2017   Procedure: Venia Minks DILATION;  Surgeon: Irene Shipper, MD;  [facility];  Service: Endoscopy;;   PERCUTANEOUS CORONARY INTERVENTION-BALLOON ONLY  01/03/2012    Procedure: PERCUTANEOUS CORONARY INTERVENTION-BALLOON ONLY;  Surgeon: Peter M Martinique, MD;  Location: [facility] CATH LAB;  Service: Cardiovascular;;   PERCUTANEOUS CORONARY STENT INTERVENTION (PCI-S)  12/31/2011   Procedure: PERCUTANEOUS CORONARY STENT INTERVENTION (PCI-S);  Surgeon: Burnell Blanks, MD;  Location: [facility] CATH LAB;  Service: Cardiovascular;;   PERCUTANEOUS CORONARY STENT INTERVENTION (PCI-S) N/A 01/03/2012   Procedure: PERCUTANEOUS CORONARY STENT INTERVENTION (PCI-S);  Surgeon: Peter M Martinique, MD;  Location: [facility] CATH LAB;  Service: Cardiovascular;  Laterality: N/A;   SHOULDER ARTHROSCOPY WITH OPEN ROTATOR  CUFF REPAIR AND DISTAL CLAVICLE ACROMINECTOMY Left 02/27/2013   Procedure: LEFT SHOULDER ARTHROSCOPY WITH MINI OPEN ROTATOR CUFF REPAIR AND SUBACROMIAL DECOMPRESSION AND DISTAL CLAVICLE RESECTION;  Surgeon: [name], MD;  Location: [facility];  Service: Orthopedics;  Laterality: Left;   TOTAL KNEE ARTHROPLASTY Right 03/2010   [name]   TRANSURETHRAL RESECTION OF BLADDER TUMOR WITH MITOMYCIN-[name] N/A 10/08/2020   Procedure: TRANSURETHRAL RESECTION OF BLADDER TUMOR WITH GEMCITABINE;  Surgeon: Robley Fries, MD;  [facility];  Service: Urology;  Laterality: N/A;  75 MINS   TRIGGER FINGER RELEASE Left 02/27/2013   Procedure: RELEASE TRIGGER FINGER/A-1 PULLEY;  Surgeon: [name], MD;  Location: [facility];  Service: Orthopedics;  Laterality: Left;    Allergies  Allergen Reactions   Doxazosin Mesylate Other (See Comments)    dizziness   Methocarbamol Rash    Immunization History  Administered Date(s) Administered   Fluad Quad(high Dose 65+) 12/26/2020   H1N1 03/27/2008   Influenza Split 12/29/2010, 01/18/2012   Influenza Whole 02/03/2007, 01/09/2008, 01/01/2009, 12/31/2009   Influenza,inj,Quad PF,6+ Mos 12/13/2012, 01/15/2014, 01/15/2015, 01/06/2016, 01/03/2017, 02/09/2018, 01/30/2019, 01/05/2020   PFIZER(Purple Top)SARS-COV-2 Vaccination 04/26/2019, 05/17/2019    Pneumococcal Conjugate-13 09/10/2013   Pneumococcal Polysaccharide-23 03/05/1997, 08/17/2011   Td 03/05/1997, 08/17/2011    Family History  Problem Relation Age of Onset   COPD Mother    Heart disease Father    Heart attack Father    Stomach cancer Brother    Stroke Sister    Alcohol abuse Sister    Colon cancer Brother 25   Diabetes Brother    Rectal cancer Neg Hx      Current Outpatient Medications:    amLODipine (NORVASC) 5 MG tablet, Take 5 mg by mouth at bedtime., Disp: , Rfl:    aspirin EC 81 MG tablet, Take 81 mg by mouth at bedtime., Disp: , Rfl:    Blood Glucose Monitoring Suppl (ONE TOUCH ULTRA 2) w/Device KIT, Use to obtain blood sugar daily. Dx Code E11.40, Disp: 1 kit, Rfl: 0   carvedilol (COREG) 6.25 MG tablet, Take 6.25 mg by mouth 2 (two) times daily., Disp: , Rfl:    clopidogrel (PLAVIX) 75 MG tablet, Take 1 tablet (75 mg total) by mouth daily. (Patient taking differently: Take 75 mg by mouth at bedtime.), Disp: 90 tablet, Rfl: 3   glucose blood (ONE TOUCH ULTRA TEST) test strip, USE TO CHECK BLOOD SUGAR ONCE A DAY Dx Code E11.40, Disp: 100 each, Rfl: 3   losartan (COZAAR) 25 MG tablet, Take 25 mg by mouth every evening., Disp: , Rfl:    Multiple Vitamins-Minerals (ICAPS PO), Take by mouth., Disp: , Rfl:    nitroGLYCERIN (NITROSTAT) 0.4 MG SL tablet, DISSOLVE 1 TABLET UNDER TONGUE AS NEEDEDFOR CHEST PAIN. MAY REPEAT 5 MINUTES APART 3 TIMES IF NEEDED. IF NO RELIEF CALL 911 (Patient taking differently: Place 0.4 mg under the tongue every 5 (five) minutes as needed for chest pain.), Disp: 25 tablet, Rfl: 0   OneTouch Delica Lancets 14N MISC, 1 each by In Vitro route daily. Dx Code E11.49, Disp: 100 each, Rfl: 3   OXYGEN, Inhale into the lungs. Per pt- Uses Oxygen 2 liters at night., Disp: , Rfl:    pantoprazole (PROTONIX) 40 MG tablet, TAKE 1 TABLET BY MOUTH TWICE (2) DAILY (Patient taking differently: Take 40 mg by mouth 2 (two) times daily.), Disp: 180 tablet, Rfl: 3    polyethylene glycol (MIRALAX / GLYCOLAX) 17 g packet, Take 17 g by  mouth every other day., Disp: , Rfl:    pregabalin (LYRICA) 50 MG capsule, Take 1 capsule (50 mg total) by mouth at bedtime., Disp: 90 capsule, Rfl: 2   tamsulosin (FLOMAX) 0.4 MG CAPS capsule, Take 0.4 mg by mouth at bedtime., Disp: , Rfl:    furosemide (LASIX) 40 MG tablet, Take 1 tablet (40 mg total) by mouth every Monday, Wednesday, and Friday., Disp: 12 tablet, Rfl: 0      Objective:   Vitals:   02/20/21 1124  BP: 130/70  Pulse: 69  Temp: (!) 97.5 F (36.4 [name])  TempSrc: Oral  SpO2: 98%  Weight: 191 lb 6.4 oz (86.8 kg)  Height: 6' (1.829 m)    Estimated body mass index is 25.96 kg/m as calculated from the following:   Height as of this encounter: 6' (1.829 m).   Weight as of this encounter: 191 lb 6.4 oz (86.8 kg).  _0 @  Filed Weights   02/20/21 1124  Weight: 191 lb 6.4 oz (86.8 kg)     Physical Exam General: No distress. Looks deconditioned Neuro: Alert and Oriented x 3. GCS 15. Speech normal Psych: Pleasant Resp:  Barrel Chest - no.  Wheeze - no, Crackles - yes at base, No overt respiratory distress CVS: Normal heart sounds. Murmurs - no Ext: Stigmata of Connective Tissue Disease - no HEENT: Normal upper airway. PEERL +. No post nasal drip        Assessment:       ICD-10-CM   1. Interstitial pulmonary disease ([name])  J84.9 CK total and CKMB (cardiac)not at [facility]    Acetylcholine Receptor Ab, All    CT Chest High Resolution    Acetylcholine Receptor Ab, All    CK total and CKMB (cardiac)not at [facility]    CANCELED: CT Chest High Resolution    CANCELED: CT Chest High Resolution         Plan:     Patient Instructions     ICD-10-CM   1. Dyspnea, unspecified type  R06.00   2. Interstitial pulmonary disease ([place])  J84.9   3. Vaccine counseling  Z71.85     Your lung disease is extremely mild and it is stable in the last few year but possible is worse recently and we need to rule  it out  Your shortness of breath is multifactorial and related to physical deconditioning, anemia, kidney disease, low nutritional state and cardiac and lung issues.   Plan Shortness of breath can be very difficult to manage and treat  [place] o2 2L [place] at night  Get high-resolution CT chest supine and prone   Do blood work for total CK, CK-MB, acetylcholine receptor antibody -to evaluate for any muscle issues causing shortness of breath  Continue oxygen use at night and also for subjective reasons in the daytime   Followup - video visit with nurse practitioner in the next few to several weeks to discuss CT scan results    SIGNATURE    Dr. Brand Males, M.D., [name],  Pulmonary and Critical Care Medicine Staff Physician, [place] Director - Interstitial Lung Disease  Program  Pulmonary [place] at [place], [place], [address]  Pager: [phone], If no answer or between  15:00h - 7:00h: call [phone] Telephone: [phone]  12:35 PM 02/20/2021

## 2021-02-20 NOTE — Patient Instructions (Addendum)
ICD-10-CM   1. Dyspnea, unspecified type  R06.00   2. Interstitial pulmonary disease ([place])  J84.9   3. Vaccine counseling  Z71.85     Your lung disease is extremely mild and it is stable in the last few year but possible is worse recently and we need to rule it out  Your shortness of breath is multifactorial and related to physical deconditioning, anemia, kidney disease, low nutritional state and cardiac and lung issues.   Plan Shortness of breath can be very difficult to manage and treat  [place] o2 2L [place] at night  Get high-resolution CT chest supine and prone   Do blood work for total CK, CK-MB, acetylcholine receptor antibody -to evaluate for any muscle issues causing shortness of breath  Continue oxygen use at night and also for subjective reasons in the daytime   Followup - video visit with nurse practitioner in the next few to several weeks to discuss CT scan results

## 2021-02-21 ENCOUNTER — Encounter: Payer: Self-pay | Admitting: Internal Medicine

## 2021-02-21 DIAGNOSIS — M792 Neuralgia and neuritis, unspecified: Secondary | ICD-10-CM

## 2021-02-21 LAB — CK TOTAL AND CKMB (NOT AT ARMC)
CK, MB: 1.3 ng/mL (ref 0–5.0)
Relative Index: 3.7 (ref 0–4.0)
Total CK: 35 U/L — ABNORMAL LOW (ref 44–196)

## 2021-02-23 NOTE — Telephone Encounter (Signed)
MR please advise. Thanks! 

## 2021-02-24 DIAGNOSIS — R300 Dysuria: Secondary | ICD-10-CM | POA: Diagnosis not present

## 2021-02-25 LAB — ACETYLCHOLINE RECEPTOR AB, ALL
AChR Binding Ab, Serum: 0.1 nmol/L (ref 0.00–0.24)
Acetylchol Block Ab: 28 % — ABNORMAL HIGH (ref 0–25)

## 2021-03-01 NOTE — Telephone Encounter (Signed)
Is not low. It is normal even if lab says low. On other hand is Acetylcholine receptor Antibody level is borderline high.   Plan  - refer to neurology -    Latest Reference Range & Units 02/20/21 12:12  CK Total 44 - 196 U/L 35 (L)  (L): Data is abnormally low  Acetylchol Block Ab 0 - 25 % 28 High    Comment: **Results verified by repeat testing**                                 Negative:      0 - 25                                 Borderline:   26 - 30                                 Positive:         >30

## 2021-03-03 ENCOUNTER — Encounter: Payer: Medicare Other | Admitting: Student in an Organized Health Care Education/Training Program

## 2021-03-04 ENCOUNTER — Ambulatory Visit (INDEPENDENT_AMBULATORY_CARE_PROVIDER_SITE_OTHER): Payer: Medicare Other | Admitting: Physician Assistant

## 2021-03-04 ENCOUNTER — Encounter: Payer: Self-pay | Admitting: Neurology

## 2021-03-04 ENCOUNTER — Other Ambulatory Visit: Payer: Self-pay

## 2021-03-04 ENCOUNTER — Encounter: Payer: Self-pay | Admitting: Physician Assistant

## 2021-03-04 ENCOUNTER — Ambulatory Visit (INDEPENDENT_AMBULATORY_CARE_PROVIDER_SITE_OTHER)
Admission: RE | Admit: 2021-03-04 | Discharge: 2021-03-04 | Disposition: A | Discharge status: Home / Self Care | Payer: Medicare Other | Source: Ambulatory Visit | Attending: Internal Medicine | Admitting: Internal Medicine

## 2021-03-04 ENCOUNTER — Ambulatory Visit
Payer: Medicare Other | Attending: Student in an Organized Health Care Education/Training Program | Admitting: Student in an Organized Health Care Education/Training Program

## 2021-03-04 VITALS — BP 126/60 | HR 74 | Ht 72.0 in | Wt 189.6 lb

## 2021-03-04 DIAGNOSIS — J849 Interstitial pulmonary disease, unspecified: Secondary | ICD-10-CM

## 2021-03-04 DIAGNOSIS — N1832 Chronic kidney disease, stage 3b: Secondary | ICD-10-CM

## 2021-03-04 DIAGNOSIS — I2510 Atherosclerotic heart disease of native coronary artery without angina pectoris: Secondary | ICD-10-CM

## 2021-03-04 DIAGNOSIS — C679 Malignant neoplasm of bladder, unspecified: Secondary | ICD-10-CM

## 2021-03-04 DIAGNOSIS — E7800 Pure hypercholesterolemia, unspecified: Secondary | ICD-10-CM | POA: Diagnosis not present

## 2021-03-04 DIAGNOSIS — I5032 Chronic diastolic (congestive) heart failure: Secondary | ICD-10-CM | POA: Diagnosis not present

## 2021-03-04 DIAGNOSIS — J479 Bronchiectasis, uncomplicated: Secondary | ICD-10-CM | POA: Diagnosis not present

## 2021-03-04 DIAGNOSIS — I10 Essential (primary) hypertension: Secondary | ICD-10-CM | POA: Diagnosis not present

## 2021-03-04 DIAGNOSIS — J929 Pleural plaque without asbestos: Secondary | ICD-10-CM | POA: Diagnosis not present

## 2021-03-04 DIAGNOSIS — I350 Nonrheumatic aortic (valve) stenosis: Secondary | ICD-10-CM

## 2021-03-04 DIAGNOSIS — M47816 Spondylosis without myelopathy or radiculopathy, lumbar region: Secondary | ICD-10-CM

## 2021-03-04 DIAGNOSIS — J948 Other specified pleural conditions: Secondary | ICD-10-CM | POA: Diagnosis not present

## 2021-03-04 NOTE — Assessment & Plan Note (Signed)
He is no longer on statin therapy for unclear reasons.  Given his advanced age, I am not certain of the benefit of resuming statin therapy at this time.  If his LDL continues to increase, we could consider resuming a low-dose of rosuvastatin.

## 2021-03-04 NOTE — Assessment & Plan Note (Signed)
Blood pressure is well controlled on a combination of amlodipine, carvedilol, losartan.

## 2021-03-04 NOTE — Patient Instructions (Signed)
Medication Instructions:  Your physician recommends that you continue on your current medications as directed. Please refer to the Current Medication list given to you today.  *If you need a refill on your cardiac medications before your next appointment, please call your pharmacy*   Lab Work: None If you have labs (blood work) drawn today and your tests are completely normal, you will receive your results only by: [place] (if you have MyChart) OR A paper copy in the mail If you have any lab test that is abnormal or we need to change your treatment, we will call you to review the results.   Follow-Up: At [facility], you and your health needs are our priority.  As part of our continuing mission to provide you with exceptional heart care, we have created designated Provider Care Teams.  These Care Teams include your primary Cardiologist (physician) and Advanced Practice Providers (APPs -  Physician Assistants and Nurse Practitioners) who all work together to provide you with the care you need, when you need it.    Your next appointment:   6 month(s)  The format for your next appointment:   In Person  Provider:   [name], MD {

## 2021-03-04 NOTE — Progress Notes (Signed)
I attempted to call the patient however no response. Voicemail left instructing patient to call front desk office at [phone] to reschedule appointment. Ok to add on for VV tomorrow AM (03/05/21) -Dr [name]

## 2021-03-04 NOTE — Assessment & Plan Note (Signed)
EF normal by echocardiogram in 10/22.  He has shortness of breath mainly related to interstitial lung disease.  Volume status overall appears stable.  Continue furosemide 40 mg every Monday, Wednesday, Friday.

## 2021-03-04 NOTE — Assessment & Plan Note (Signed)
Recent creatinine stable. 

## 2021-03-04 NOTE — Assessment & Plan Note (Signed)
Mild to moderate aortic stenosis with a mean gradient 14 mmHg by echocardiogram in 10/22.

## 2021-03-04 NOTE — Assessment & Plan Note (Signed)
He has undergone resection and completed BCG therapy.  He has a follow-up cystoscopy with urology soon.

## 2021-03-04 NOTE — Progress Notes (Signed)
Cardiology Office Note:    Date:  03/04/2021   ID:  [name], [name] May 19, 1931, MRN [id number]  PCP:  [name] Carbon, MD   [facility] Providers Cardiologist:  [name] Rouge, MD     Referring MD: [name] Carbon, MD   Chief Complaint:  F/u for CAD, AS, CHF    Patient Profile:   [name] is a 87 y.o. male with:  Coronary artery disease  S/p CABG S/p DES to [facility] and cutting POBA to [place] in 12/2011 S/p DES to S-OM2 in 01/2014 S/p DES to S-OM1/OM2 due to ISR Aortic stenosis Echo 10/22: EF 60-65, mean AV gradient 14 mmHg (mild-moderate AS) (HFpEF) heart failure with preserved ejection fraction  ILD (Interstitial Lung Disease)  Chronic kidney disease  Losartan DC'd secondary to worsening creatinine Rheumatoid arthritis  Hypertension  Hyperlipidemia  Diabetes mellitus  Aortic atherosclerosis Dysphagia Undergoes regular Botox injections with GI Bladder CA S/p TURBT in 10/2020; BCG Rx  History of Present Illness: [name] was last seen by [name] in January 2022.  He was admitted in 10/22 with urosepsis and 1/2 Blood Cx with Staph hominis.  Echocardiogram showed mild AS with normal EF and no apparent vegetation.  He returns for routine Cardiology f/u.  He is here with his wife.  He is overall doing well.  He is finished with BCG injections.  He sees urology soon for a repeat cysto.  He is not having chest pain, syncope or leg edema.  He has chronic shortness of breath and is followed by [name] for his ILD (Interstitial Lung Disease).    ASSESSMENT & PLAN:   CAD (coronary artery disease) History of CABG in 2001 and multiple PCIs since.  He is currently doing well without angina.  Continue aspirin 81 mg daily, carvedilol 6.25 mg twice daily, clopidogrel 75 mg daily.  Follow-up in 6 months.  (HFpEF) heart failure with preserved ejection fraction ([name]) EF normal by echocardiogram in 10/22.  He has shortness of breath mainly related to interstitial lung  disease.  Volume status overall appears stable.  Continue furosemide 40 mg every Monday, Wednesday, Friday.  Essential hypertension Blood pressure is well controlled on a combination of amlodipine, carvedilol, losartan.  HLD (hyperlipidemia) He is no longer on statin therapy for unclear reasons.  Given his advanced age, I am not certain of the benefit of resuming statin therapy at this time.  If his LDL continues to increase, we could consider resuming a low-dose of rosuvastatin.  Aortic stenosis Mild to moderate aortic stenosis with a mean gradient 14 mmHg by echocardiogram in 10/22.  ILD (interstitial lung disease) ([place]) He follows up with [name] with pulmonology.  Bladder cancer [facility]) He has undergone resection and completed BCG therapy.  He has a follow-up cystoscopy with urology soon.  Stage 3b chronic kidney disease ([place]) Recent creatinine stable.           Dispo:  Return in about 6 months (around 09/01/2021) for Routine Follow Up w/ [name].    Prior CV studies: Echocardiogram 01/09/2021 EF 60-65, no RWMA, GR 1 DD, normal RVSF, mild-moderate aortic stenosis (mean gradient 14 mmHg, V-max 247 cm/s, DI 0.32)  Echocardiogram 04/11/2020 EF 55-60, GR 1 DD, RVSP 27.4, mild aortic stenosis (mean 14 mmHg, V-max 235.5 cm/s, DI 0.32)  Event monitor 04/12/2017 Sinus rhythm, isolated PACs, PVCs; no significant arrhythmia  Cardiac Catheterization 08/13/15 LM ost 80 LAD prox 100 OM2 90 RCA prox 100 L-LAD ok S-OM1/OM2 prox  28 ISR S-RCA 100 (CTO) S-D1 prox 100 (CTO) PCI:  3.5 x 12 mm Resolute DES to S-OM1/OM2  Nuclear stress test 01/08/2014 Severe ischemia in mid LAD territory, large inferior scar, EF 45; high risk     Past Medical History:  Diagnosis Date   Arthritis    osteoarthritis, s/p R TKR, and digits   CAD (coronary artery disease)    a. s/p CABG (2001)  b. s/p DES to RCA and cutting POBA to ostial PDA (2013)   c. s/p DES to SVG to OM2 (01/14/14) d. cath:  08/2015 NSTEMI w/ patent LIMA-LAD and 99% stenosis of SVG-OM w/ DES placed. CTO of SVG-RCA and SVG-D1.    Cataract    Chronic diastolic CHF (congestive heart failure) ([place])    a) 09/13 ECHO- LVEF 69-67%, grade 1 diastolic dysfunction, mild LA dilatation, atrial septal aneurysm, AV mobility restricted, but no sig AS by doppler; b) 09/04/08 ECHO- LVH, ef 60%, mild AS, c. echo 08/2015: EF perserved of 55-60% with inferolateral HK. Mild AS noted.   Chronic kidney disease, stage III (moderate) ([name])    Chronic lower back pain    Colon polyps    COVID-19    Diverticulosis    Dyspnea 2009 since July -Sept   05/06/08-CPST-  normal effort, reduced VO2 max 20.5 /65%, reduced at 8.2/ 40%, normal breathing resetvca of 55%, submaximal heart rate response 112/77%, flattened o2 pluse response at peak exercise-12 ml/beat @ 85%, No VQ mismatch abnormalities, All c/w CIRC Limitation   Enlarged prostate    Esophageal stricture    a. s/p dilation spring 2010   GERD (gastroesophageal reflux disease)    Heart murmur    Hiatal hernia    History of carpal tunnel syndrome    Bilateral   History of kidney stones    History of PFTs    mixed pattern on spiro. mild restn on lung volumes with near normal DLCO. Pattern can be explained by CABG scar. Fev1 2.2L/73%, ratio 68 (67), TLC 4.7/68%,RV 1.5L/55%,DLCO 79%   Hyperlipidemia    Hypertension    Interstitial lung disease ([name])    NOS   Iron deficiency anemia    Nausea & vomiting    2018/2019   On home oxygen therapy    2 L [place] at bedtime   Osteoporosis    Overweight (BMI 25.0-29.9)    BMI 29   Peripheral neuropathy    RA (rheumatoid arthritis) ([place])    [name]   Seropositive rheumatoid arthritis ([place])    Type II diabetes mellitus ([place])    diet controlled   Wears glasses    Wears partial dentures    upper   Current Medications: Current Meds  Medication Sig   amLODipine (NORVASC) 5 MG tablet Take 5 mg by mouth at bedtime.   aspirin EC 81 MG tablet  Take 81 mg by mouth at bedtime.   Blood Glucose Monitoring Suppl (ONE TOUCH ULTRA 2) w/Device KIT Use to obtain blood sugar daily. Dx Code E11.40   carvedilol (COREG) 6.25 MG tablet Take 6.25 mg by mouth 2 (two) times daily.   clopidogrel (PLAVIX) 75 MG tablet Take 1 tablet (75 mg total) by mouth daily.   furosemide (LASIX) 40 MG tablet Take 1 tablet (40 mg total) by mouth every Monday, Wednesday, and Friday.   glucose blood (ONE TOUCH ULTRA TEST) test strip USE TO CHECK BLOOD SUGAR ONCE A DAY Dx Code E11.40   losartan (COZAAR) 25 MG tablet Take 25 mg by  mouth every evening.   Multiple Vitamins-Minerals (ICAPS PO) Take 1 tablet by mouth daily.   nitroGLYCERIN (NITROSTAT) 0.4 MG SL tablet DISSOLVE 1 TABLET UNDER TONGUE AS NEEDEDFOR CHEST PAIN. MAY REPEAT 5 MINUTES APART 3 TIMES IF NEEDED. IF NO RELIEF CALL 161   OneTouch Delica Lancets 09U MISC 1 each by In Vitro route daily. Dx Code E11.49   OXYGEN Inhale into the lungs. Per pt- Uses Oxygen 2 liters at night.   pantoprazole (PROTONIX) 40 MG tablet TAKE 1 TABLET BY MOUTH TWICE (2) DAILY   polyethylene glycol (MIRALAX / GLYCOLAX) 17 g packet Take 17 g by mouth every other day.   pregabalin (LYRICA) 50 MG capsule Take 1 capsule (50 mg total) by mouth at bedtime.   tamsulosin (FLOMAX) 0.4 MG CAPS capsule Take 0.4 mg by mouth at bedtime.    Allergies:   Doxazosin mesylate and Methocarbamol   Social History   Tobacco Use   Smoking status: Former    Packs/day: 1.00    Years: 20.00    Pack years: 20.00    Types: Cigarettes    Quit date: 04/06/1963    Years since quitting: 57.9   Smokeless tobacco: Former  Scientific laboratory technician Use: Never used  Substance Use Topics   Alcohol use: No    Alcohol/week: 0.0 standard drinks    Comment: 01/01/2012 "last alcohol ~ 12 yr ago"   Drug use: No    Family Hx: The patient's family history includes Alcohol abuse in his sister; COPD in his mother; Colon cancer (age of onset: 24) in his brother; Diabetes in  his brother; Heart attack in his father; Heart disease in his father; Stomach cancer in his brother; Stroke in his sister. There is no history of Rectal cancer.  Review of Systems  Gastrointestinal:  Negative for hematochezia.  Genitourinary:  Negative for hematuria.    EKGs/Labs/Other Test Reviewed:    EKG:  EKG is not ordered today.  The ekg ordered today demonstrates n/a  Recent Labs: 04/12/2020: Magnesium 2.1 04/13/2020: B Natriuretic Peptide 76.9 01/28/2021: ALT 24 01/29/2021: Hemoglobin 12.7; Platelets 240 01/30/2021: BUN 31; Creatinine, Ser 1.99; Potassium 3.5; Sodium 135   Recent Lipid Panel Lab Results  Component Value Date/Time   CHOL 209 (H) 06/13/2019 10:57 AM   TRIG 275.0 (H) 06/13/2019 10:57 AM   HDL 35.50 (L) 06/13/2019 10:57 AM   LDLCALC 43 10/14/2016 02:29 PM   LDLDIRECT 125.0 06/13/2019 10:57 AM     Risk Assessment/Calculations:          Physical Exam:    VS:  BP 126/60   Pulse 74   Ht 6' (1.829 m)   Wt 189 lb 9.6 oz (86 kg)   SpO2 95%   BMI 25.71 kg/m     Wt Readings from Last 3 Encounters:  03/04/21 189 lb 9.6 oz (86 kg)  02/20/21 191 lb 6.4 oz (86.8 kg)  02/04/21 186 lb (84.4 kg)    Constitutional:      Appearance: Healthy appearance. Not in distress.  Neck:     Vascular: No JVR. JVD normal.  Pulmonary:     Effort: Pulmonary effort is normal.     Breath sounds: No wheezing. No rales.  Cardiovascular:     Normal rate. Regular rhythm. Normal S1. Normal S2.      Murmurs: There is a grade 2/6 crescendo-decrescendo systolic murmur at the URSB.  Edema:    Peripheral edema absent.  Abdominal:     Palpations: Abdomen  is soft.  Skin:    General: Skin is warm and dry.  Neurological:     General: No focal deficit present.     Mental Status: Alert and oriented to person, place and time.     Cranial Nerves: Cranial nerves are intact.        Medication Adjustments/Labs and Tests Ordered: Current medicines are reviewed at length with the patient  today.  Concerns regarding medicines are outlined above.  Tests Ordered: No orders of the defined types were placed in this encounter.  Medication Changes: No orders of the defined types were placed in this encounter.  Signed, Richardson Dopp, PA-C  03/04/2021 4:43 PM    [facility] [address], [place],   [address] Phone: [phone]; Fax: [phone]

## 2021-03-04 NOTE — Assessment & Plan Note (Signed)
He follows up with Dr. Chase Caller with pulmonology.

## 2021-03-04 NOTE — Assessment & Plan Note (Signed)
History of CABG in 2001 and multiple PCIs since.  He is currently doing well without angina.  Continue aspirin 81 mg daily, carvedilol 6.25 mg twice daily, clopidogrel 75 mg daily.  Follow-up in 6 months.

## 2021-03-05 ENCOUNTER — Inpatient Hospital Stay: Admission: RE | Admit: 2021-03-05 | Payer: Medicare Other | Source: Ambulatory Visit

## 2021-03-05 ENCOUNTER — Ambulatory Visit
Payer: Medicare Other | Attending: Student in an Organized Health Care Education/Training Program | Admitting: Student in an Organized Health Care Education/Training Program

## 2021-03-05 ENCOUNTER — Encounter: Payer: Self-pay | Admitting: Student in an Organized Health Care Education/Training Program

## 2021-03-05 DIAGNOSIS — M792 Neuralgia and neuritis, unspecified: Secondary | ICD-10-CM | POA: Diagnosis not present

## 2021-03-05 DIAGNOSIS — E1142 Type 2 diabetes mellitus with diabetic polyneuropathy: Secondary | ICD-10-CM | POA: Diagnosis not present

## 2021-03-05 DIAGNOSIS — G8929 Other chronic pain: Secondary | ICD-10-CM | POA: Diagnosis not present

## 2021-03-05 DIAGNOSIS — M79605 Pain in left leg: Secondary | ICD-10-CM

## 2021-03-05 DIAGNOSIS — M79604 Pain in right leg: Secondary | ICD-10-CM

## 2021-03-05 DIAGNOSIS — G894 Chronic pain syndrome: Secondary | ICD-10-CM

## 2021-03-05 DIAGNOSIS — M47816 Spondylosis without myelopathy or radiculopathy, lumbar region: Secondary | ICD-10-CM | POA: Diagnosis not present

## 2021-03-05 MED ORDER — HYDROCODONE-ACETAMINOPHEN 10-325 MG PO TABS: 1.0000 | ORAL_TABLET | Freq: Four times a day (QID) | ORAL | 0 refills | Status: AC | PRN

## 2021-03-05 MED ORDER — PREGABALIN 25 MG PO CAPS: 25.0000 mg | ORAL_CAPSULE | Freq: Two times a day (BID) | ORAL | 2 refills | Status: DC

## 2021-03-05 NOTE — Progress Notes (Signed)
Patient: [name]  Service Category: E/M  Provider: Gillis Santa, MD  DOB: 1931/09/06  DOS: 03/05/2021  Location: Office  MRN: [id number]  Setting: Ambulatory outpatient  Referring Provider: [name], MD  Type: Established Patient  Specialty: Interventional Pain Management  PCP: [name] Carbon, MD  Location: Remote location  Delivery: TeleHealth     Virtual Encounter - Pain Management PROVIDER NOTE: Information contained herein reflects review and annotations entered in association with encounter. Interpretation of such information and data should be left to medically-trained personnel. Information provided to patient can be located elsewhere in the medical record under "Patient Instructions". Document created using STT-dictation technology, any transcriptional errors that may result from process are unintentional.    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 [place] [address] Phone: [phone] Fax: [phone]   Pre-screening  [name] offered "in-person" vs "virtual" encounter. He indicated preferring virtual for this encounter.   Reason COVID-19*  Social distancing based on CDC and AMA recommendations.   I contacted [name] on 03/05/2021 via telephone.      I clearly identified myself as Gillis Santa, MD. I verified that I was speaking with the correct person using two identifiers (Name: [name], and date of birth: 1931/11/11).  Consent I sought verbal advanced consent from [name] for virtual visit interactions. I informed [name] of possible security and privacy concerns, risks, and limitations associated  with providing "not-in-person" medical evaluation and management services. I also informed [name] of the availability of "in-person" appointments. Finally, I informed him that there would be a charge for the virtual visit and that he could be  personally, fully or partially, financially responsible for it. [name] expressed understanding and agreed to proceed.   Historic Elements   [name] is a 87 y.o. year old, male patient evaluated today after our last contact on 03/04/2021. [name]  has a past medical history of Arthritis, CAD (coronary artery disease), Cataract, Chronic diastolic CHF (congestive heart failure) ([place]), Chronic kidney disease, stage III (moderate) ([place]), Chronic lower back pain, Colon polyps, COVID-19, Diverticulosis, Dyspnea (2009 since July -Sept), Enlarged prostate, Esophageal stricture, GERD (gastroesophageal reflux disease), Heart murmur, Hiatal hernia, History of carpal tunnel syndrome, History of kidney stones, History of PFTs, Hyperlipidemia, Hypertension, Interstitial lung disease ([place]), Iron deficiency anemia, Nausea & vomiting, On home oxygen therapy, Osteoporosis, Overweight (BMI 25.0-29.9), Peripheral neuropathy, RA (rheumatoid arthritis) ([place]), Seropositive rheumatoid arthritis ([place]), Type II diabetes mellitus ([place]), Wears glasses, and Wears partial dentures. He also  has a past surgical history that includes Total knee arthroplasty (Right, 03/2010); Knee arthroscopy (Right, 2008); Cataract extraction w/ intraocular lens  implant, bilateral (Bilateral); Coronary artery bypass graft (11/1999); Coronary stent placement (02/2012); Shoulder arthroscopy with open rotator cuff repair and distal clavicle acrominectomy (Left, 02/27/2013); Trigger finger release (Left, 02/27/2013); Cholecystectomy open (11/2003); Joint replacement; Esophagogastroduodenoscopy (egd) with esophageal dilation (2010); Cardiac catheterization (08/2004); Cardiac catheterization (12/31/2011);  Cardiac catheterization (2009); left and right heart catheterization with coronary angiogram (12/31/2011); percutaneous coronary stent intervention (pci-s) (12/31/2011); percutaneous coronary stent intervention (pci-s) (N/A, 01/03/2012); Percutaneous coronary intervention-balloon only (01/03/2012); left and right heart catheterization with coronary angiogram (N/A, 01/14/2014); Hand surgery; Cardiac catheterization (N/A, 08/13/2015); Esophagogastroduodenoscopy (  N/A, 03/01/2017); maloney dilation (03/01/2017); Esophagogastroduodenoscopy (egd) with propofol (N/A, 09/12/2018); Botox injection (N/A, 09/12/2018); Balloon dilation (N/A, 09/12/2018); Coronary angioplasty with stent (01/03/2012); Coronary angioplasty with stent (01/14/2014); Esophagogastroduodenoscopy (egd) with propofol (N/A, 11/27/2018); Botox injection (N/A, 11/27/2018); Esophageal dilation (11/27/2018); Esophagogastroduodenoscopy (egd) with propofol (N/A, 02/20/2020); Botox injection (N/A, 02/20/2020); Balloon dilation (N/A, 02/20/2020); and Transurethral resection of bladder tumor with mitomycin-c (N/A, 10/08/2020). [name] has a current medication list which includes the following prescription(s): amlodipine, aspirin ec, one touch ultra 2, carvedilol, clopidogrel, furosemide, glucose blood, [START ON 03/11/2021] hydrocodone-acetaminophen, [START ON 04/10/2021] hydrocodone-acetaminophen, [START ON 05/10/2021] hydrocodone-acetaminophen, losartan, multiple vitamins-minerals, nitroglycerin, onetouch delica lancets 93G, oxygen-helium, pantoprazole, polyethylene glycol, pregabalin, and tamsulosin. He  reports that he quit smoking about 57 years ago. His smoking use included cigarettes. He has a 20.00 pack-year smoking history. He has quit using smokeless tobacco. He reports that he does not drink alcohol and does not use drugs. [name] is allergic to doxazosin mesylate and methocarbamol.   HPI  Today, he is being contacted for medication management.  No change in  medical history since last visit.  Patient's pain is at baseline. Increased right lower back pain due to lumbar DDD, spinal stenosis. Patient is requesting to add Lyrica during the day.  He does have some sedation when he takes it.  Recommend decreasing dose to 25 mg but doing that BID to see if that regimen provides better pain relief during the day.  If he tolerates that well without any side effects can increase to 50 mg twice daily.   Pharmacotherapy Assessment   Analgesic: Hydrocodone 10 q6 hrs MME 40    Monitoring: [place] PMP: PDMP reviewed during this encounter.       Pharmacotherapy: No side-effects or adverse reactions reported. Compliance: No problems identified. Effectiveness: Clinically acceptable. Plan: Refer to "POC". UDS:  Summary  Date Value Ref Range Status  09/09/2020 Note  Final    Comment:    ==================================================================== [employer] Select 13 (MW) ==================================================================== Test                             Result       Flag       Units  Drug Present and Declared for Prescription Verification   Hydrocodone                    2490         EXPECTED   ng/mg creat   Hydromorphone                  2240         EXPECTED   ng/mg creat   Dihydrocodeine                 352          EXPECTED   ng/mg creat   Norhydrocodone                 1583         EXPECTED   ng/mg creat    Sources of hydrocodone include scheduled prescription medications.    Hydromorphone, dihydrocodeine and norhydrocodone are expected    metabolites of hydrocodone. Hydromorphone and dihydrocodeine are    also available as scheduled prescription medications.  ==================================================================== Test                      Result    Flag   Units  Ref Range   Creatinine              90               mg/dL      >=20 ==================================================================== Declared  Medications:  The flagging and interpretation on this report are based on the  following declared medications.  Unexpected results may arise from  inaccuracies in the declared medications.   **Note: The testing scope of this panel includes these medications:   Hydrocodone (Norco)   **Note: The testing scope of this panel does not include the  following reported medications:   Acetaminophen (Norco)  Amlodipine (Norvasc)  Aspirin  Carvedilol (Coreg)  Cephalexin (Keflex)  Clopidogrel (Plavix)  Furosemide (Lasix)  Helium  Losartan (Cozaar)  Lutein  Nitroglycerin (Nitrostat)  Omega-3 Fatty Acids  Oxygen  Pantoprazole (Protonix)  Polyethylene Glycol (MiraLAX)  Pregabalin (Lyrica)  Semaglutide (Rybelsus)  Tamsulosin (Flomax)  Vitamin C ==================================================================== For clinical consultation, please call [phone]. ====================================================================      Laboratory Chemistry Profile   Renal Lab Results  Component Value Date   BUN 31 (H) 01/30/2021   CREATININE 1.99 (H) 01/30/2021   BCR 13 06/24/2017   GFR 29.52 (L) 10/29/2020   GFRAA 30 (L) 06/24/2017   GFRNONAA 32 (L) 01/30/2021    Hepatic Lab Results  Component Value Date   AST 28 01/28/2021   ALT 24 01/28/2021   ALBUMIN 3.2 (L) 01/28/2021   ALKPHOS 72 01/28/2021   LIPASE 32 01/27/2021    Electrolytes Lab Results  Component Value Date   NA 135 01/30/2021   K 3.5 01/30/2021   CL 100 01/30/2021   CALCIUM 9.4 01/30/2021   MG 2.1 04/12/2020   PHOS 2.9 06/13/2019    Bone No results found for: VD25OH, VD125OH2TOT, UD1497WY6, VZ8588FO2, 25OHVITD1, 25OHVITD2, 25OHVITD3, TESTOFREE, TESTOSTERONE  Inflammation (CRP: Acute Phase) (ESR: Chronic Phase) Lab Results  Component Value Date   ESRSEDRATE 38 (H) 11/22/2011   LATICACIDVEN 1.6 01/28/2021         Note: Above Lab results reviewed.  Imaging  CT Chest High Resolution CLINICAL  DATA:  87 year old male with history of chronic shortness of breath and history of bronchiectasis. Follow-up study.  EXAM: CT CHEST WITHOUT CONTRAST  TECHNIQUE: Multidetector CT imaging of the chest was performed following the standard protocol without intravenous contrast. High resolution imaging of the lungs, as well as inspiratory and expiratory imaging, was performed.  COMPARISON:  Chest CT 02/06/2019.  FINDINGS: Cardiovascular: Heart size is normal. There is no significant pericardial fluid, thickening or pericardial calcification. There is aortic atherosclerosis, as well as atherosclerosis of the great vessels of the mediastinum and the coronary arteries, including calcified atherosclerotic plaque in the left main, left anterior descending, left circumflex and right coronary arteries. Status post median sternotomy for CABG including LIMA to the LAD. Extremely severe calcifications of the aortic valve.  Mediastinum/Nodes: No pathologically enlarged mediastinal or hilar lymph nodes. Please note that accurate exclusion of hilar adenopathy is limited on noncontrast CT scans. Esophagus is unremarkable in appearance. No axillary lymphadenopathy.  Lungs/Pleura: Chronic pleural thickening in the right hemithorax where there is also some pleural calcification, similar to the prior study. No calcified pleural plaque in the left hemithorax. High-resolution images again demonstrate widespread areas of cylindrical bronchiectasis, most evident throughout the mid to lower lungs bilaterally. There is some associated thickening of the peribronchovascular interstitium and some mild regional architectural distortion. No generalized areas of septal thickening, subpleural reticulation or honeycombing are noted.  Inspiratory and expiratory imaging again demonstrates some mild air trapping indicative of mild small airways disease. No acute consolidative airspace disease. No pleural  effusions.  Upper Abdomen: Incompletely imaged exophytic lesion extending off the medial aspect of the upper pole of the left kidney, incompletely characterized on today's non-contrast CT examination, but similar to the prior study and statistically likely to represent a cyst. Aortic atherosclerosis. Status post cholecystectomy.  Musculoskeletal: Progression of previously noted compression fracture of T12, now with 60% loss of anterior vertebral body height. There are no aggressive appearing lytic or blastic lesions noted in the visualized portions of the skeleton.  IMPRESSION: 1. Chronic cylindrical bronchiectasis and mild scarring in the lungs, similar to the prior study. 2. No evidence of interstitial lung disease. 3. Mild air trapping indicative of mild small airways disease. 4. Right pleural thickening calcification, presumably related to remote right-sided pleural hemorrhage and/or pleural infection. 5. Aortic atherosclerosis, in addition to left main and 3 vessel coronary artery disease. Status post median sternotomy for CABG including LIMA to the LAD. 6. There are extremely severe calcifications of the aortic valve. Echocardiographic correlation for evaluation of potential valvular dysfunction is strongly recommended.  Aortic Atherosclerosis (ICD10-I70.0).  Electronically Signed   By: Vinnie Langton M.D.   On: 03/04/2021 21:09  Assessment  The primary encounter diagnosis was Lumbar facet arthropathy. Diagnoses of Lumbar spondylosis, Neuropathic pain, Diabetic polyneuropathy associated with type 2 diabetes mellitus ([place]), Chronic pain of both lower extremities, and Chronic pain syndrome were also pertinent to this visit.  Plan of Care  Problem-specific:  No problem-specific Assessment & Plan notes found for this encounter.  Mr. DILLARD PASCAL has a current medication list which includes the following long-term medication(s): furosemide, nitroglycerin, pantoprazole,  and pregabalin.  Pharmacotherapy (Medications Ordered): Meds ordered this encounter  Medications   HYDROcodone-acetaminophen (NORCO) 10-325 MG tablet    Sig: Take 1 tablet by mouth every 6 (six) hours as needed for severe pain. Must last 30 days.    Dispense:  120 tablet    Refill:  0    Chronic Pain: STOP Act (Not applicable) Fill 1 day early if closed on refill date. Avoid benzodiazepines within 8 hours of opioids   pregabalin (LYRICA) 25 MG capsule    Sig: Take 1 capsule (25 mg total) by mouth 2 (two) times daily.    Dispense:  60 capsule    Refill:  2    Fill one day early if pharmacy is closed on scheduled refill date. May substitute for generic if available.   HYDROcodone-acetaminophen (NORCO) 10-325 MG tablet    Sig: Take 1 tablet by mouth every 6 (six) hours as needed for severe pain. Must last 30 days.    Dispense:  120 tablet    Refill:  0    Chronic Pain: STOP Act (Not applicable) Fill 1 day early if closed on refill date. Avoid benzodiazepines within 8 hours of opioids   HYDROcodone-acetaminophen (NORCO) 10-325 MG tablet    Sig: Take 1 tablet by mouth every 6 (six) hours as needed for severe pain. Must last 30 days.    Dispense:  120 tablet    Refill:  0    Chronic Pain: STOP Act (Not applicable) Fill 1 day early if closed on refill date. Avoid benzodiazepines within 8 hours of opioids   Orders:  No orders of the defined types were placed in this encounter.  Follow-up plan:   Return in about 13 weeks (around 06/04/2021) for Medication Management, in  person.    Recent Visits Date Type Provider Dept  03/04/21 Office Visit Gillis Santa, MD [facility]  03/03/21 Appointment Gillis Santa, MD [facility]  Showing recent visits within past 90 days and meeting all other requirements Today's Visits Date Type Provider Dept  03/05/21 Office Visit Gillis Santa, MD [facility]  Showing today's visits and meeting all other requirements Future  Appointments No visits were found meeting these conditions. Showing future appointments within next 90 days and meeting all other requirements I discussed the assessment and treatment plan with the patient. The patient was provided an opportunity to ask questions and all were answered. The patient agreed with the plan and demonstrated an understanding of the instructions.  Patient advised to call back or seek an in-person evaluation if the symptoms or condition worsens.  Duration of encounter: 57mnutes.  Note by: BGillis Santa MD Date: 03/05/2021; Time: 10:28 AM

## 2021-03-06 ENCOUNTER — Other Ambulatory Visit: Payer: Self-pay

## 2021-03-06 MED ORDER — CLOPIDOGREL BISULFATE 75 MG PO TABS: 75.0000 mg | ORAL_TABLET | Freq: Every day | ORAL | 3 refills | Status: DC

## 2021-03-06 NOTE — Progress Notes (Addendum)
Anesthesia Review:  PCP: DR Viviana Simpler  LOV 02/04/21  Cardiologist : Richardson Dopp, [place] LOV 03/04/21  Pulm- DR [name]- LOV 02/20/21.   Chest x-ray :1v-01/27/21  EKG :01/29/21  Echo :01/29/21  03/04/21- Ct Chest  Stress test: Cardiac Cath :  Activity level: cannot do a flight of stairs without difficulty  Sleep Study/ CPAP : none  Fasting Blood Sugar :      / Checks Blood Sugar -- times a day:   Blood Thinner/ Instructions /Last Dose: ASA / Instructions/ Last Dose :   81 mg Aspirin  Plavix - stop 5-7 days prior per pt and daughter  DM- type 2  Checks glucose 2-3 x per week per pt  Hgba1c-03/10/21- 7.5  routed to DR [facility] on 03/10/21.   BMP done 03/10/2021 routed to DR pace on 03/10/21.  Janett Billow [facility] aware.

## 2021-03-09 NOTE — Progress Notes (Signed)
[name]  03/09/2021   Your procedure is scheduled on:     03/17/2021   Report to [facility] Main  Entrance   Report to admitting at    [phone]     Call this number if you have problems the morning of surgery [phone]    Remember: Do not eat food , candy gum or mints :After Midnight. You may have clear liquids from midnight until __  0800am   CLEAR LIQUID DIET   Foods Allowed                                                                       Coffee and tea, regular and decaf                              Plain Jell-O any favor except red or purple                                            Fruit ices (not with fruit pulp)                                      Iced Popsicles                                     Carbonated beverages, regular and diet                                    Cranberry, grape and apple juices Sports drinks like Gatorade Lightly seasoned clear broth or consume(fat free) Sugar   _____________________________________________________________________    BRUSH YOUR TEETH MORNING OF SURGERY AND RINSE YOUR MOUTH OUT, NO CHEWING GUM CANDY OR MINTS.     Take these medicines the morning of surgery with A SIP OF WATER:    Coreg, protonix, lyrica  DO NOT TAKE ANY DIABETIC MEDICATIONS DAY OF YOUR SURGERY                               You may not have any metal on your body including hair pins and              piercings  Do not wear jewelry, make-up, lotions, powders or perfumes, deodorant             Do not wear nail polish on your fingernails.  Do not shave  48 hours prior to surgery.              Men may shave face and neck.   Do not bring valuables to the hospital. [place].  Contacts, dentures or bridgework may not be worn into surgery.  Leave suitcase in the car. After surgery it may be brought to your room.     Patients discharged the day of surgery will not be  allowed to drive home. IF YOU ARE HAVING SURGERY AND GOING HOME THE SAME DAY, YOU MUST HAVE AN ADULT TO DRIVE YOU HOME AND BE WITH YOU FOR 24 HOURS. YOU MAY GO HOME BY TAXI OR UBER OR ORTHERWISE, BUT AN ADULT MUST ACCOMPANY YOU HOME AND STAY WITH YOU FOR 24 HOURS.  Name and phone number of your driver:  Special Instructions: N/A              Please read over the following fact sheets you were given: _____________________________________________________________________  [facility] - Preparing for Surgery Before surgery, you can play an important role.  Because skin is not sterile, your skin needs to be as free of germs as possible.  You can reduce the number of germs on your skin by washing with CHG (chlorahexidine gluconate) soap before surgery.  CHG is an antiseptic cleaner which kills germs and bonds with the skin to continue killing germs even after washing. Please DO NOT use if you have an allergy to CHG or antibacterial soaps.  If your skin becomes reddened/irritated stop using the CHG and inform your nurse when you arrive at Short Stay. Do not shave (including legs and underarms) for at least 48 hours prior to the first CHG shower.  You may shave your face/neck. Please follow these instructions carefully:  1.  Shower with CHG Soap the night before surgery and the  morning of Surgery.  2.  If you choose to wash your hair, wash your hair first as usual with your  normal  shampoo.  3.  After you shampoo, rinse your hair and body thoroughly to remove the  shampoo.                           4.  Use CHG as you would any other liquid soap.  You can apply chg directly  to the skin and wash                       Gently with a scrungie or clean washcloth.  5.  Apply the CHG Soap to your body ONLY FROM THE NECK DOWN.   Do not use on face/ open                           Wound or open sores. Avoid contact with eyes, ears mouth and genitals (private parts).                       Wash face,  Genitals  (private parts) with your normal soap.             6.  Wash thoroughly, paying special attention to the area where your surgery  will be performed.  7.  Thoroughly rinse your body with warm water from the neck down.  8.  DO NOT shower/wash with your normal soap after using and rinsing off  the CHG Soap.                9.  Pat yourself dry with a clean towel.            10.  Wear clean pajamas.  11.  Place clean sheets on your bed the night of your first shower and do not  sleep with pets. Day of Surgery : Do not apply any lotions/deodorants the morning of surgery.  Please wear clean clothes to the hospital/surgery center.  FAILURE TO FOLLOW THESE INSTRUCTIONS MAY RESULT IN THE CANCELLATION OF YOUR SURGERY PATIENT SIGNATURE_________________________________  NURSE SIGNATURE__________________________________  ________________________________________________________________________

## 2021-03-10 ENCOUNTER — Other Ambulatory Visit: Payer: Self-pay

## 2021-03-10 ENCOUNTER — Encounter (HOSPITAL_COMMUNITY)
Admission: RE | Admit: 2021-03-10 | Discharge: 2021-03-10 | Disposition: A | Discharge status: Home / Self Care | Payer: Medicare Other | Source: Ambulatory Visit | Attending: Urology | Admitting: Urology

## 2021-03-10 ENCOUNTER — Encounter (HOSPITAL_COMMUNITY): Payer: Self-pay

## 2021-03-10 VITALS — BP 153/72 | HR 74 | Temp 98.0°F | Resp 18 | Ht 72.0 in | Wt 198.1 lb

## 2021-03-10 DIAGNOSIS — Z01812 Encounter for preprocedural laboratory examination: Secondary | ICD-10-CM | POA: Insufficient documentation

## 2021-03-10 DIAGNOSIS — E1142 Type 2 diabetes mellitus with diabetic polyneuropathy: Secondary | ICD-10-CM | POA: Insufficient documentation

## 2021-03-10 DIAGNOSIS — N184 Chronic kidney disease, stage 4 (severe): Secondary | ICD-10-CM | POA: Diagnosis not present

## 2021-03-10 HISTORY — DX: Malignant (primary) neoplasm, unspecified: C80.1

## 2021-03-10 LAB — BASIC METABOLIC PANEL
Anion gap: 10 (ref 5–15)
BUN: 51 — AB (ref 4–21)
BUN: 55 mg/dL — ABNORMAL HIGH (ref 8–23)
CO2: 26 mmol/L (ref 22–32)
CO2: 28 — AB (ref 13–22)
Calcium: 9.6 mg/dL (ref 8.9–10.3)
Chloride: 103 (ref 99–108)
Chloride: 104 mmol/L (ref 98–111)
Creatinine, Ser: 3.26 mg/dL — ABNORMAL HIGH (ref 0.61–1.24)
Creatinine: 3.1 — AB (ref 0.6–1.3)
GFR, Estimated: 18 mL/min — ABNORMAL LOW (ref >60)
Glucose, Bld: 249 mg/dL — ABNORMAL HIGH (ref 70–99)
Glucose: 230
Potassium: 5 mmol/L (ref 3.5–5.1)
Potassium: 5.1 (ref 3.4–5.3)
Sodium: 140 mmol/L (ref 135–145)
Sodium: 143 (ref 137–147)

## 2021-03-10 LAB — CBC
HCT: 37.8 % — ABNORMAL LOW (ref 39.0–52.0)
Hemoglobin: 11.8 g/dL — ABNORMAL LOW (ref 13.0–17.0)
MCH: 26.6 pg (ref 26.0–34.0)
MCHC: 31.2 g/dL (ref 30.0–36.0)
MCV: 85.3 fL (ref 80.0–100.0)
Platelets: 283 10*3/uL (ref 150–400)
RBC: 4.43 MIL/uL (ref 4.22–5.81)
RDW: 14.7 % (ref 11.5–15.5)
WBC: 9 10*3/uL (ref 4.0–10.5)
nRBC: 0 % (ref 0.0–0.2)

## 2021-03-10 LAB — HEMOGLOBIN A1C
Hgb A1c MFr Bld: 7.5 % — ABNORMAL HIGH (ref 4.8–5.6)
Mean Plasma Glucose: 168.55 mg/dL

## 2021-03-10 LAB — COMPREHENSIVE METABOLIC PANEL
Albumin: 3.9 (ref 3.5–5.0)
Calcium: 9.9 (ref 8.7–10.7)

## 2021-03-10 LAB — GLUCOSE, CAPILLARY: Glucose-Capillary: 244 mg/dL — ABNORMAL HIGH (ref 70–99)

## 2021-03-10 NOTE — Progress Notes (Signed)
Anesthesia Chart Review   Case: [id number] Date/Time: 03/17/21 1045   Procedures:      DIAGNOSTIC CYSTOSCOPY WITH POSSIBLE BIOPSY - 1 HR     TRANSURETHRAL RESECTION OF BLADDER TUMOR (TURBT)   Anesthesia type: General   Pre-op diagnosis: BLADDER CANCER   Location: [place] [facility]   Surgeons: Robley Fries, MD       DISCUSSION:87 y.o. former smoker with h/o GERD, HTN, CKD Stage IV, CAD (CABG 2001, DES 2013, 2015), CHF, mild to moderate AV stenosis (valve area 1.35 cm2, mean gradient 14.0 mmHg), RA, DM II, bladder cancer scheduled for above procedure 03/17/2021 with Dr. Jacalyn Lefevre.   Pt last seen by cardiology 03/04/2021. Per OV note stable at this visit.    Pt with chronic dyspnea. Uses 2L O2 at bedtime. Last seen by pulmonology 02/20/2021.  Elevated creatinine at PAT visit, forwarded to Dr. Claudia Desanctis.  Recheck DOS.  VS: BP (!) 153/72   Pulse 74   Temp 36.7 C (Oral)   Resp 18   Ht 6' (1.829 m)   Wt 89.8 kg   SpO2 96%   BMI 26.86 kg/m   PROVIDERS: [name], MD is PCP   Cardiologist:  [name], MD     LABS:  forwarded to surgeon (all labs ordered are listed, but only abnormal results are displayed)  Labs Reviewed  HEMOGLOBIN A1C - Abnormal; Notable for the following components:      Result Value   Hgb A1c MFr Bld 7.5 (*)    All other components within normal limits  BASIC METABOLIC PANEL - Abnormal; Notable for the following components:   Glucose, Bld 249 (*)    BUN 55 (*)    Creatinine, Ser 3.26 (*)    GFR, Estimated 18 (*)    All other components within normal limits  CBC - Abnormal; Notable for the following components:   Hemoglobin 11.8 (*)    HCT 37.8 (*)    All other components within normal limits  GLUCOSE, CAPILLARY - Abnormal; Notable for the following components:   Glucose-Capillary 244 (*)    All other components within normal limits     IMAGES:   EKG: 01/28/2021 Rate 55 bpm  Sinus rhythm  Ventricular  trigeminy Borderline left axis deviation Borderline T wave abnormalities   CV: Echo 01/29/2021 1. Left ventricular ejection fraction, by estimation, is 60 to 65%. The  left ventricle has normal function. The left ventricle has no regional  wall motion abnormalities. Left ventricular diastolic parameters are  consistent with Grade I diastolic  dysfunction (impaired relaxation).   2. Right ventricular systolic function is normal. The right ventricular  size is mildly enlarged. Tricuspid regurgitation signal is inadequate for  assessing PA pressure.   3. The mitral valve is degenerative. No evidence of mitral valve  regurgitation. No evidence of mitral stenosis.   4. The aortic valve is calcified. There is moderate calcification of the  aortic valve. There is moderate thickening of the aortic valve. Aortic  valve regurgitation is not visualized. Mild to moderate aortic valve  stenosis. Aortic valve area, by VTI  measures 1.35 cm. Aortic valve mean gradient measures 14.0 mmHg. Aortic  valve Vmax measures 2.47 m/s.  Past Medical History:  Diagnosis Date   Arthritis    osteoarthritis, s/p R TKR, and digits   CAD (coronary artery disease)    a. s/p CABG (2001)  b. s/p DES to RCA and cutting POBA to ostial PDA (2013)  c. s/p DES to SVG to OM2 (01/14/14) d. cath: 08/2015 NSTEMI w/ patent LIMA-LAD and 99% stenosis of SVG-OM w/ DES placed. CTO of SVG-RCA and SVG-D1.    Cancer [facility])    Cataract    Chronic diastolic CHF (congestive heart failure) ([place])    a) 09/13 ECHO- LVEF 60-10%, grade 1 diastolic dysfunction, mild LA dilatation, atrial septal aneurysm, AV mobility restricted, but no sig AS by doppler; b) 09/04/08 ECHO- LVH, ef 60%, mild AS, c. echo 08/2015: EF perserved of 55-60% with inferolateral HK. Mild AS noted.   Chronic kidney disease, stage III (moderate) ([name])    Chronic lower back pain    Colon polyps    COVID-19    Diverticulosis    Dyspnea 2009 since July -Sept   05/06/08-CPST-   normal effort, reduced VO2 max 20.5 /65%, reduced at 8.2/ 40%, normal breathing resetvca of 55%, submaximal heart rate response 112/77%, flattened o2 pluse response at peak exercise-12 ml/beat @ 85%, No VQ mismatch abnormalities, All c/w CIRC Limitation   Enlarged prostate    Esophageal stricture    a. s/p dilation spring 2010   GERD (gastroesophageal reflux disease)    Heart murmur    Hiatal hernia    History of carpal tunnel syndrome    Bilateral   History of kidney stones    History of PFTs    mixed pattern on spiro. mild restn on lung volumes with near normal DLCO. Pattern can be explained by CABG scar. Fev1 2.2L/73%, ratio 68 (67), TLC 4.7/68%,RV 1.5L/55%,DLCO 79%   Hyperlipidemia    Hypertension    Interstitial lung disease ([name])    NOS   Iron deficiency anemia    Myocardial infarction ([name])    Nausea & vomiting    2018/2019   On home oxygen therapy    2 L Raymond at bedtime   Osteoporosis    Overweight (BMI 25.0-29.9)    BMI 29   Peripheral neuropathy    RA (rheumatoid arthritis) ([place])    [name]   Seropositive rheumatoid arthritis ([place])    Type II diabetes mellitus ([place])    diet controlled   Wears glasses    Wears partial dentures    upper    Past Surgical History:  Procedure Laterality Date   BALLOON DILATION N/A 09/12/2018   Procedure: BALLOON DILATION;  Surgeon: Irene Shipper, MD;  [facility];  Service: Endoscopy;  Laterality: N/A;   BALLOON DILATION N/A 02/20/2020   Procedure: BALLOON DILATION;  Surgeon: Irene Shipper, MD;  [facility];  Service: Endoscopy;  Laterality: N/A;   BOTOX INJECTION N/A 09/12/2018   Procedure: BOTOX INJECTION;  Surgeon: Irene Shipper, MD;  [facility];  Service: Endoscopy;  Laterality: N/A;   BOTOX INJECTION N/A 11/27/2018   Procedure: BOTOX INJECTION;  Surgeon: Irene Shipper, MD;  [facility];  Service: Endoscopy;  Laterality: N/A;   BOTOX INJECTION N/A 02/20/2020   Procedure: BOTOX  INJECTION;  Surgeon: Irene Shipper, MD;  [facility];  Service: Endoscopy;  Laterality: N/A;   CARDIAC CATHETERIZATION  08/2004   CP- no MI, Cath- small vessell disease    CARDIAC CATHETERIZATION  12/31/2011   80% distal LM, 100% native LAD, LCx and RCA, 30% prox SVG-OM, SVG-D1 normal, 99% distal, 80% ostial SVG-RCA distal to graft, LIMA-LAD normal; LVEF mildly decreased with posterior basal AK    CARDIAC CATHETERIZATION  2009   with patent grafts/notes 12/31/2011   CARDIAC CATHETERIZATION N/A  08/13/2015   Procedure: Left Heart Cath and Cors/Grafts Angiography;  Surgeon: Sherren Mocha, MD;  Location: [place] CV LAB;  Service: Cardiovascular;  Laterality: N/A;   CATARACT EXTRACTION W/ INTRAOCULAR LENS  IMPLANT, BILATERAL Bilateral    CHOLECYSTECTOMY OPEN  11/2003   [name]   CORONARY ANGIOPLASTY WITH STENT PLACEMENT  01/03/2012   Successful DES to SVG-RCA and cutting balloon angioplasty ostial  PDA    CORONARY ANGIOPLASTY WITH STENT PLACEMENT  01/14/2014   "1"   CORONARY ARTERY BYPASS GRAFT  11/1999   CABG X5   CORONARY STENT PLACEMENT  02/2012   1 stent and balloon   ESOPHAGEAL DILATION  11/27/2018   Procedure: ESOPHAGEAL DILATION;  Surgeon: Irene Shipper, MD;  [facility];  Service: Endoscopy;;   ESOPHAGOGASTRODUODENOSCOPY N/A 03/01/2017   Procedure: ESOPHAGOGASTRODUODENOSCOPY (EGD);  Surgeon: Irene Shipper, MD;  Location: [facility];  Service: Endoscopy;  Laterality: N/A;   ESOPHAGOGASTRODUODENOSCOPY (EGD) WITH ESOPHAGEAL DILATION  2010   ESOPHAGOGASTRODUODENOSCOPY (EGD) WITH PROPOFOL N/A 09/12/2018   Procedure: ESOPHAGOGASTRODUODENOSCOPY (EGD) WITH PROPOFOL;  Surgeon: Irene Shipper, MD;  [facility];  Service: Endoscopy;  Laterality: N/A;   ESOPHAGOGASTRODUODENOSCOPY (EGD) WITH PROPOFOL N/A 11/27/2018   Procedure: ESOPHAGOGASTRODUODENOSCOPY (EGD) WITH PROPOFOL, WITH BALLOON DILATION;  Surgeon: Irene Shipper, MD;  [facility];  Service: Endoscopy;   Laterality: N/A;   ESOPHAGOGASTRODUODENOSCOPY (EGD) WITH PROPOFOL N/A 02/20/2020   Procedure: ESOPHAGOGASTRODUODENOSCOPY (EGD) WITH PROPOFOL;  Surgeon: Irene Shipper, MD;  [facility];  Service: Endoscopy;  Laterality: N/A;   HAND SURGERY     bilateral carpal tunnel releases   JOINT REPLACEMENT     KNEE ARTHROSCOPY Right 2008   LEFT AND RIGHT HEART CATHETERIZATION WITH CORONARY ANGIOGRAM  12/31/2011   Procedure: LEFT AND RIGHT HEART CATHETERIZATION WITH CORONARY ANGIOGRAM;  Surgeon: Burnell Blanks, MD;  Location: [facility] CATH LAB;  Service: Cardiovascular;;   LEFT AND RIGHT HEART CATHETERIZATION WITH CORONARY ANGIOGRAM N/A 01/14/2014   Procedure: LEFT AND RIGHT HEART CATHETERIZATION WITH CORONARY ANGIOGRAM;  Surgeon: Peter M Martinique, MD;  Location: [facility] CATH LAB;  Service: Cardiovascular;  Laterality: N/A;   MALONEY DILATION  03/01/2017   Procedure: Venia Minks DILATION;  Surgeon: Irene Shipper, MD;  [facility];  Service: Endoscopy;;   PERCUTANEOUS CORONARY INTERVENTION-BALLOON ONLY  01/03/2012   Procedure: PERCUTANEOUS CORONARY INTERVENTION-BALLOON ONLY;  Surgeon: Peter M Martinique, MD;  Location: [facility] CATH LAB;  Service: Cardiovascular;;   PERCUTANEOUS CORONARY STENT INTERVENTION (PCI-S)  12/31/2011   Procedure: PERCUTANEOUS CORONARY STENT INTERVENTION (PCI-S);  Surgeon: Burnell Blanks, MD;  Location: [facility] CATH LAB;  Service: Cardiovascular;;   PERCUTANEOUS CORONARY STENT INTERVENTION (PCI-S) N/A 01/03/2012   Procedure: PERCUTANEOUS CORONARY STENT INTERVENTION (PCI-S);  Surgeon: Peter M Martinique, MD;  Location: [facility] CATH LAB;  Service: Cardiovascular;  Laterality: N/A;   SHOULDER ARTHROSCOPY WITH OPEN ROTATOR CUFF REPAIR AND DISTAL CLAVICLE ACROMINECTOMY Left 02/27/2013   Procedure: LEFT SHOULDER ARTHROSCOPY WITH MINI OPEN ROTATOR CUFF REPAIR AND SUBACROMIAL DECOMPRESSION AND DISTAL CLAVICLE RESECTION;  Surgeon: [name], MD;  Location: [place];  Service: Orthopedics;   Laterality: Left;   TOTAL KNEE ARTHROPLASTY Right 03/2010   [name]   TRANSURETHRAL RESECTION OF BLADDER TUMOR WITH MITOMYCIN-C N/A 10/08/2020   Procedure: TRANSURETHRAL RESECTION OF BLADDER TUMOR WITH GEMCITABINE;  Surgeon: Robley Fries, MD;  [facility];  Service: Urology;  Laterality: N/A;  75 MINS   TRIGGER FINGER RELEASE Left 02/27/2013   Procedure: RELEASE TRIGGER FINGER/A-1 PULLEY;  Surgeon: [name], MD;  Location: [place];  Service: Orthopedics;  Laterality: Left;    MEDICATIONS:  amLODipine (NORVASC) 5 MG tablet   aspirin EC 81 MG tablet   Blood Glucose Monitoring Suppl (ONE TOUCH ULTRA 2) w/Device KIT   carvedilol (COREG) 6.25 MG tablet   clopidogrel (PLAVIX) 75 MG tablet   furosemide (LASIX) 40 MG tablet   glucose blood (ONE TOUCH ULTRA TEST) test strip   [START ON 03/11/2021] HYDROcodone-acetaminophen (NORCO) 10-325 MG tablet   [START ON 04/10/2021] HYDROcodone-acetaminophen (NORCO) 10-325 MG tablet   [START ON 05/10/2021] HYDROcodone-acetaminophen (NORCO) 10-325 MG tablet   losartan (COZAAR) 25 MG tablet   Multiple Vitamins-Minerals (ICAPS AREDS 2 PO)   nitroGLYCERIN (NITROSTAT) 0.4 MG SL tablet   OneTouch Delica Lancets 69V MISC   OXYGEN   pantoprazole (PROTONIX) 40 MG tablet   polyethylene glycol (MIRALAX / GLYCOLAX) 17 g packet   pregabalin (LYRICA) 25 MG capsule   pregabalin (LYRICA) 50 MG capsule   tamsulosin (FLOMAX) 0.4 MG CAPS capsule   No current facility-administered medications for this encounter.

## 2021-03-16 DIAGNOSIS — R809 Proteinuria, unspecified: Secondary | ICD-10-CM | POA: Diagnosis not present

## 2021-03-16 DIAGNOSIS — N390 Urinary tract infection, site not specified: Secondary | ICD-10-CM | POA: Diagnosis not present

## 2021-03-16 DIAGNOSIS — N179 Acute kidney failure, unspecified: Secondary | ICD-10-CM | POA: Diagnosis not present

## 2021-03-16 DIAGNOSIS — N184 Chronic kidney disease, stage 4 (severe): Secondary | ICD-10-CM | POA: Diagnosis not present

## 2021-03-16 DIAGNOSIS — I129 Hypertensive chronic kidney disease with stage 1 through stage 4 chronic kidney disease, or unspecified chronic kidney disease: Secondary | ICD-10-CM | POA: Diagnosis not present

## 2021-03-16 DIAGNOSIS — K222 Esophageal obstruction: Secondary | ICD-10-CM | POA: Diagnosis not present

## 2021-03-16 DIAGNOSIS — E1122 Type 2 diabetes mellitus with diabetic chronic kidney disease: Secondary | ICD-10-CM | POA: Diagnosis not present

## 2021-03-16 DIAGNOSIS — N3289 Other specified disorders of bladder: Secondary | ICD-10-CM | POA: Diagnosis not present

## 2021-03-16 DIAGNOSIS — I2510 Atherosclerotic heart disease of native coronary artery without angina pectoris: Secondary | ICD-10-CM | POA: Diagnosis not present

## 2021-03-16 NOTE — H&P (Signed)
CC/HPI: cc: Bladder cancer   10/17/20: 87 year old man who presented with gross hematuria found to have bladder mass on imaging confirmed on cystoscopy. He underwent TURBT which showed greater than 2 cm bladder tumor that was resected on 10/08/2020. He also had intravesical gemcitabine postoperatively. He returns today for pathology results and void trial. Pathology shows high-grade Ta disease however muscle was not present in the specimen. He had withdrawals from hydrocodone following surgery. He is back on his regular pain medication and doing little bit better. He is still experiencing some weakness and fatigue following the surgery. He is here with his wife in 2 daughters today.   02/09/21: 87 year old man who initially presented with gross hematuria found to have high-grade Ta urothelial cell carcinoma diagnosed July 2022. He has completed induction BCG. He is here today for surveillance cystoscopy. No gross hematuria.     ALLERGIES: No Known Drug Allergies    MEDICATIONS: Aspirin  Plavix 75 mg tablet  Amlodipine Besylate 5 mg tablet  Coreg 6.25 mg tablet  Flomax 0.4 mg capsule  Lasix 40 mg tablet  Lyrica 50 mg capsule  Nitrostat  Protonix 40 mg tablet, delayed release  Rybelsus 3 mg tablet  Vitamin C     GU PSH: Bladder Instill AntiCA Agent - 01/26/2021, 01/19/2021, 01/12/2021, 12/29/2020, 12/01/2020, 11/24/2020, 11/10/2020, 11/03/2020 Cystoscopy - 10/01/2020 Cystoscopy TURBT 2-5 cm - 10/08/2020     NON-GU PSH: Coronary Artery Bypass Grafting Hand/finger Surgery, Bilateral Knee Arthroscopy, Right Shoulder Arthroscopy/surgery, Left     GU PMH: Bladder Cancer Posterior - 01/26/2021, - 01/19/2021, - 01/12/2021, - 01/05/2021, - 12/29/2020, - 12/01/2020, - 11/24/2020, - 11/10/2020, - 11/03/2020, - 10/17/2020 Bladder tumor/neoplasm - 10/01/2020, - 09/23/2020 Gross hematuria - 10/01/2020, - 09/23/2020 Chronic kidney disease stage 4 (GFR 15-30) Unil Inguinal Hernia W/O obst or gang,non-recurrent     NON-GU PMH: Encounter for antineoplastic chemotherapy - 11/03/2020 Achalasia of cardia Arthritis Atherosclerotic heart disease of native coronary artery with unspecified angina pectoris Bilateral primary osteoarthritis of knee Cardiac murmur, unspecified Chronic diastolic (congestive) heart failure Constipation, unspecified Diabetes Type 2 Diverticulosis of large intestine without perforation or abscess without bleeding Dysphagia, unspecified GERD Glaucoma Interstitial pulmonary disease, unspecified Iron deficiency anemia, unspecified Neuralgia and neuritis, unspecified Obstructive sleep apnea (adult) (pediatric) Opioid dependence, uncomplicated Orthostatic hypotension Polyneuropathy, unspecified Presence of right artificial knee joint Rheumatoid arthritis with rheumatoid factor, unspecified Ventricular tachycardia    FAMILY HISTORY: 3 daughters - Runs in Family Alcoholism - Sister Colon Cancer - Brother copd - Mother Diabetes - Brother Heart Disease - Father Prostate Cancer - Brother   SOCIAL HISTORY: Marital Status: Married Preferred Language: English; Ethnicity: Not Hispanic Or Latino; Race: White Current Smoking Status: Patient smokes occasionally.   Tobacco Use Assessment Completed: Used Tobacco in last 30 days? Has never drank.  Drinks 2 caffeinated drinks per day.    REVIEW OF SYSTEMS:    GU Review Male:   Patient denies frequent urination, hard to postpone urination, burning/ pain with urination, get up at night to urinate, leakage of urine, stream starts and stops, trouble starting your stream, have to strain to urinate , erection problems, and penile pain.  Gastrointestinal (Upper):   Patient denies nausea, vomiting, and indigestion/ heartburn.  Gastrointestinal (Lower):   Patient denies diarrhea and constipation.  Constitutional:   Patient denies fever, night sweats, weight loss, and fatigue.  Skin:   Patient denies skin rash/ lesion and itching.  Eyes:    Patient denies blurred vision and double vision.  Ears/ Nose/ Throat:  Patient denies sore throat and sinus problems.  Hematologic/Lymphatic:   Patient denies swollen glands and easy bruising.  Cardiovascular:   Patient denies leg swelling and chest pains.  Respiratory:   Patient denies cough and shortness of breath.  Endocrine:   Patient denies excessive thirst.  Musculoskeletal:   Patient denies back pain and joint pain.  Neurological:   Patient denies headaches and dizziness.  Psychologic:   Patient denies depression and anxiety.   VITAL SIGNS: None   MULTI-SYSTEM PHYSICAL EXAMINATION:    Constitutional: Well-nourished. No physical deformities. Normally developed. Good grooming.  Neck: Neck symmetrical, not swollen. Normal tracheal position.  Respiratory: No labored breathing, no use of accessory muscles.   Skin: No paleness, no jaundice, no cyanosis. No lesion, no ulcer, no rash.  Neurologic / Psychiatric: Oriented to time, oriented to place, oriented to person. No depression, no anxiety, no agitation.  Gastrointestinal: No rigidity, non obese abdomen.   Eyes: Normal conjunctivae. Normal eyelids.  Ears, Nose, Mouth, and Throat: Left ear no scars, no lesions, no masses. Right ear no scars, no lesions, no masses. Nose no scars, no lesions, no masses. Normal hearing. Normal lips.  Musculoskeletal: Normal gait and station of head and neck.     Complexity of Data:  Records Review:   Previous Patient Records, POC Tool  Urine Test Review:   Urinalysis  Notes:                     01/30/2021: BUN 31, creatinine 1.99   PROCEDURES:         Flexible Cystoscopy - 52000  Risks, benefits, and some of the potential complications of the procedure were discussed at length with the patient including infection, bleeding, voiding discomfort, urinary retention, fever, chills, sepsis, and others. All questions were answered. Informed consent was obtained. Sterile technique and intraurethral analgesia  were used.  Meatus:  Normal size. Normal location. Normal condition.  Urethra:  No strictures.  External Sphincter:  Normal.  Verumontanum:  Normal.  Prostate:  Obstructing lateral lobes  Bladder Neck:  Non-obstructing.  Ureteral Orifices:  Normal location. Normal size. Normal shape. Effluxed clear urine.  Bladder:  Severe trabeculation. Significant amount of debris in bladder preventing good view of bladder mucosa. No distinct bladder mass seen.      The lower urinary tract was carefully examined. The procedure was well-tolerated and without complications. Antibiotic instructions were given. Instructions were given to call the office immediately for bloody urine, difficulty urinating, urinary retention, painful or frequent urination, fever, chills, nausea, vomiting or other illness. The patient stated that he understood these instructions and would comply with them.         Urinalysis w/Scope Dipstick Dipstick Cont'd Micro  Color: Yellow Bilirubin: Neg mg/dL WBC/hpf: 40 - 60/hpf  Appearance: Turbid Ketones: Neg mg/dL RBC/hpf: 20 - 40/hpf  Specific Gravity: 1.025 Blood: 3+ ery/uL Bacteria: Rare (0-9/hpf)  pH: 5.5 Protein: Trace mg/dL Cystals: NS (Not Seen)  Glucose: Neg mg/dL Urobilinogen: 0.2 mg/dL Casts: NS (Not Seen)    Nitrites: Neg Trichomonas: Not Present    Leukocyte Esterase: 3+ leu/uL Mucous: Present      Epithelial Cells: NS (Not Seen)      Yeast: NS (Not Seen)      Sperm: Not Present    ASSESSMENT:      ICD-10 Details  1 GU:   Bladder Cancer Posterior - C67.4 Chronic, Stable   PLAN:           Document Letter(s):  Created for Patient: Clinical Summary         Notes:   1. Bladder cancer: It is very difficult to see and bladder due to debris present. I am unsure if there is bladder cancer recurrence noted. I discussed repeating cystoscopy in 3 months versus diagnostic cystoscopy with possible biopsy/TURBT in the operating room. I did discuss my hesitancy due to risk of  anesthesia based on patient's age. Patient, wife and daughters would like to proceed with cystoscopy in the operating room. Risks and benefits of the procedure were discussed with the patient in detail and he is elected to proceed. He will be scheduled for surgery the next fill date.

## 2021-03-17 ENCOUNTER — Encounter (HOSPITAL_COMMUNITY): Payer: Self-pay | Admitting: Urology

## 2021-03-17 ENCOUNTER — Encounter (HOSPITAL_COMMUNITY)
Admission: RE | Disposition: A | Discharge status: Home / Self Care | Payer: Self-pay | Source: Ambulatory Visit | Attending: Urology

## 2021-03-17 ENCOUNTER — Ambulatory Visit (HOSPITAL_COMMUNITY): Payer: Medicare Other | Admitting: Physician Assistant

## 2021-03-17 ENCOUNTER — Ambulatory Visit (HOSPITAL_COMMUNITY): Payer: Medicare Other | Admitting: Certified Registered Nurse Anesthetist

## 2021-03-17 ENCOUNTER — Ambulatory Visit (HOSPITAL_COMMUNITY)
Admission: RE | Admit: 2021-03-17 | Discharge: 2021-03-17 | Disposition: A | Discharge status: Home / Self Care | Payer: Medicare Other | Source: Ambulatory Visit | Attending: Urology | Admitting: Urology

## 2021-03-17 DIAGNOSIS — G4733 Obstructive sleep apnea (adult) (pediatric): Secondary | ICD-10-CM | POA: Diagnosis not present

## 2021-03-17 DIAGNOSIS — C674 Malignant neoplasm of posterior wall of bladder: Secondary | ICD-10-CM | POA: Diagnosis not present

## 2021-03-17 DIAGNOSIS — K2100 Gastro-esophageal reflux disease with esophagitis, without bleeding: Secondary | ICD-10-CM | POA: Diagnosis not present

## 2021-03-17 DIAGNOSIS — F17200 Nicotine dependence, unspecified, uncomplicated: Secondary | ICD-10-CM | POA: Diagnosis not present

## 2021-03-17 DIAGNOSIS — C679 Malignant neoplasm of bladder, unspecified: Secondary | ICD-10-CM | POA: Diagnosis not present

## 2021-03-17 DIAGNOSIS — Z08 Encounter for follow-up examination after completed treatment for malignant neoplasm: Secondary | ICD-10-CM | POA: Insufficient documentation

## 2021-03-17 DIAGNOSIS — R0689 Other abnormalities of breathing: Secondary | ICD-10-CM | POA: Diagnosis not present

## 2021-03-17 DIAGNOSIS — Z8551 Personal history of malignant neoplasm of bladder: Secondary | ICD-10-CM | POA: Insufficient documentation

## 2021-03-17 DIAGNOSIS — J8410 Pulmonary fibrosis, unspecified: Secondary | ICD-10-CM | POA: Diagnosis not present

## 2021-03-17 HISTORY — PX: CYSTOSCOPY WITH BIOPSY: SHX5122

## 2021-03-17 HISTORY — PX: TRANSURETHRAL RESECTION OF BLADDER TUMOR: SHX2575

## 2021-03-17 LAB — GLUCOSE, CAPILLARY
Glucose-Capillary: 161 mg/dL — ABNORMAL HIGH (ref 70–99)
Glucose-Capillary: 144 mg/dL — ABNORMAL HIGH (ref 70–99)

## 2021-03-17 SURGERY — CYSTOSCOPY, WITH BIOPSY
Anesthesia: General

## 2021-03-17 MED ORDER — ONDANSETRON HCL 4 MG/2ML IJ SOLN
INTRAMUSCULAR | Status: DC | PRN
  Administered 2021-03-17: 4 mg via INTRAVENOUS

## 2021-03-17 MED ORDER — HYDRALAZINE HCL 20 MG/ML IJ SOLN
10.0000 mg | Freq: Once | INTRAMUSCULAR | Status: AC
  Administered 2021-03-17: 10 mg via INTRAVENOUS

## 2021-03-17 MED ORDER — CHLORHEXIDINE GLUCONATE 0.12 % MT SOLN: 15.0000 mL | Freq: Once | OROMUCOSAL | Status: AC

## 2021-03-17 MED ORDER — LACTATED RINGERS IV SOLN: INTRAVENOUS | Status: DC

## 2021-03-17 MED ORDER — PROPOFOL 10 MG/ML IV BOLUS
INTRAVENOUS | Status: DC | PRN
  Administered 2021-03-17: 150 mg via INTRAVENOUS

## 2021-03-17 MED ORDER — DEXAMETHASONE SODIUM PHOSPHATE 10 MG/ML IJ SOLN
INTRAMUSCULAR | Status: DC | PRN
Start: 2021-03-17 — End: 2021-03-17
  Administered 2021-03-17: 4 mg via INTRAVENOUS

## 2021-03-17 MED ORDER — LIDOCAINE HCL (PF) 2 % IJ SOLN
INTRAMUSCULAR | Status: AC
  Filled 2021-03-17: qty 5

## 2021-03-17 MED ORDER — HYDRALAZINE HCL 20 MG/ML IJ SOLN
INTRAMUSCULAR | Status: AC
  Filled 2021-03-17: qty 1

## 2021-03-17 MED ORDER — LIDOCAINE 2% (20 MG/ML) 5 ML SYRINGE
INTRAMUSCULAR | Status: DC | PRN
  Administered 2021-03-17: 80 mg via INTRAVENOUS

## 2021-03-17 MED ORDER — ACETAMINOPHEN 10 MG/ML IV SOLN: 1000.0000 mg | Freq: Once | INTRAVENOUS | Status: DC | PRN

## 2021-03-17 MED ORDER — FENTANYL CITRATE PF 50 MCG/ML IJ SOSY: 25.0000 ug | PREFILLED_SYRINGE | INTRAMUSCULAR | Status: DC | PRN

## 2021-03-17 MED ORDER — EPHEDRINE SULFATE-NACL 50-0.9 MG/10ML-% IV SOSY
PREFILLED_SYRINGE | INTRAVENOUS | Status: DC | PRN
  Administered 2021-03-17: 5 mg via INTRAVENOUS

## 2021-03-17 MED ORDER — FENTANYL CITRATE (PF) 100 MCG/2ML IJ SOLN
INTRAMUSCULAR | Status: DC | PRN
  Administered 2021-03-17 (×4): 25 ug via INTRAVENOUS

## 2021-03-17 MED ORDER — CEPHALEXIN 250 MG PO CAPS: 250.0000 mg | ORAL_CAPSULE | Freq: Every day | ORAL | 0 refills | Status: AC

## 2021-03-17 MED ORDER — FENTANYL CITRATE (PF) 100 MCG/2ML IJ SOLN
INTRAMUSCULAR | Status: AC
  Filled 2021-03-17: qty 2

## 2021-03-17 MED ORDER — ORAL CARE MOUTH RINSE
15.0000 mL | Freq: Once | OROMUCOSAL | Status: AC
  Administered 2021-03-17: 15 mL via OROMUCOSAL

## 2021-03-17 MED ORDER — CEFAZOLIN SODIUM-DEXTROSE 2-4 GM/100ML-% IV SOLN
2.0000 g | INTRAVENOUS | Status: AC
  Administered 2021-03-17: 2 g via INTRAVENOUS
  Filled 2021-03-17: qty 100

## 2021-03-17 SURGICAL SUPPLY — 16 items
BAG URINE DRAIN 2000ML AR STRL (UROLOGICAL SUPPLIES) ×2 IMPLANT
BAG URO CATCHER STRL LF (MISCELLANEOUS) ×3 IMPLANT
CLOTH BEACON ORANGE TIMEOUT ST (SAFETY) ×3 IMPLANT
DRAPE FOOT SWITCH (DRAPES) ×3 IMPLANT
ELECT REM PT RETURN 15FT ADLT (MISCELLANEOUS) ×3 IMPLANT
GLOVE SURG ENC MOIS LTX SZ6.5 (GLOVE) ×3 IMPLANT
GOWN STRL REUS W/TWL LRG LVL3 (GOWN DISPOSABLE) ×3 IMPLANT
KIT TURNOVER KIT A (KITS) ×2 IMPLANT
LOOP CUT BIPOLAR 24F LRG (ELECTROSURGICAL) ×2 IMPLANT
MANIFOLD NEPTUNE II (INSTRUMENTS) ×3 IMPLANT
PACK CYSTO (CUSTOM PROCEDURE TRAY) ×3 IMPLANT
SYR TOOMEY IRRIG 70ML (MISCELLANEOUS) ×3
SYRINGE TOOMEY IRRIG 70ML (MISCELLANEOUS) IMPLANT
TUBING CONNECTING 10 (TUBING) ×2 IMPLANT
TUBING CONNECTING 10' (TUBING) ×1
TUBING UROLOGY SET (TUBING) ×3 IMPLANT

## 2021-03-17 NOTE — Interval H&P Note (Signed)
History and Physical Interval Note:  03/17/2021 11:31 AM  [name]  has presented today for surgery, with the diagnosis of BLADDER CANCER.  The various methods of treatment have been discussed with the patient and family. After consideration of risks, benefits and other options for treatment, the patient has consented to  Procedure(s) with comments: DIAGNOSTIC CYSTOSCOPY WITH POSSIBLE BIOPSY (N/A) - 1 HR TRANSURETHRAL RESECTION OF BLADDER TUMOR (TURBT) (N/A) as a surgical intervention.  The patient's history has been reviewed, patient examined, no change in status, stable for surgery.  I have reviewed the patient's chart and labs.  Questions were answered to the patient's satisfaction.     Azlee Monforte D Casimir Barcellos

## 2021-03-17 NOTE — Transfer of Care (Signed)
Immediate Anesthesia Transfer of Care Note  Patient: [name]  Procedure(s) Performed: DIAGNOSTIC CYSTOSCOPY WITH BIOPSY TRANSURETHRAL RESECTION OF BLADDER TUMOR (TURBT)  Patient Location: PACU  Anesthesia Type:General  Level of Consciousness: awake, alert , oriented and patient cooperative  Airway & Oxygen Therapy: Patient Spontanous Breathing and Patient connected to face mask oxygen  Post-op Assessment: Report given to RN, Post -op Vital signs reviewed and stable and Patient moving all extremities  Post vital signs: Reviewed and stable  Last Vitals:  Vitals Value Taken Time  BP 165/74 03/17/21 1237  Temp    Pulse 65 03/17/21 1239  Resp 28 03/17/21 1239  SpO2 100 % 03/17/21 1239  Vitals shown include unvalidated device data.  Last Pain:  Vitals:   03/17/21 0920  TempSrc: Oral         Complications: No notable events documented.

## 2021-03-17 NOTE — Anesthesia Procedure Notes (Signed)
Procedure Name: LMA Insertion Date/Time: 03/17/2021 11:52 AM Performed by: Milford Cage, CRNA Pre-anesthesia Checklist: Patient identified, Emergency Drugs available, Suction available and Patient being monitored Patient Re-evaluated:Patient Re-evaluated prior to induction Oxygen Delivery Method: Circle system utilized Preoxygenation: Pre-oxygenation with 100% oxygen Induction Type: IV induction Ventilation: Mask ventilation without difficulty LMA: LMA inserted LMA Size: 4.0 Number of attempts: 1 Tube secured with: Tape Dental Injury: Teeth and Oropharynx as per pre-operative assessment

## 2021-03-17 NOTE — Discharge Instructions (Signed)
Transurethral Resection of Bladder Tumor (TURBT)   Definition:  Transurethral Resection of the Bladder Tumor is a surgical procedure used to diagnose and remove tumors within the bladder. TURBT is the most common treatment for early stage bladder cancer.  General instructions:     Your recent bladder surgery requires very little post hospital care but some definite precautions.  Despite the fact that no skin incisions were used, the area around the bladder incisions are raw and covered with scabs to promote healing and prevent bleeding. Certain precautions are needed to insure that the scabs are not disturbed over the next 2-4 weeks while the healing proceeds.  Because the raw surface inside your bladder and the irritating effects of urine you may expect frequency of urination and/or urgency (a stronger desire to urinate) and perhaps even getting up at night more often. This will usually resolve or improve slowly over the healing period. You may see some blood in your urine over the first 6 weeks. Do not be alarmed, even if the urine was clear for a while. Get off your feet and drink lots of fluids until clearing occurs. If you start to pass clots or don't improve call us.  Catheter: (If you are discharged with a catheter.)  1. Keep your catheter secured to your leg at all times with tape or the supplied strap. 2. You may experience leakage of urine around your catheter- as long as the  catheter continues to drain, this is normal.  If your catheter stops draining  go to the ER. 3. You may also have blood in your urine, even after it has been clear for  several days; you may even pass some small blood clots or other material.  This  is normal as well.  If this happens, sit down and drink plenty of water to help  make urine to flush out your bladder.  If the blood in your urine becomes worse  after doing this, contact our office or return to the ER. 4. You may use the leg bag (small bag)  during the day, but use the large bag at  night.  Diet:  You may return to your normal diet immediately. Because of the raw surface of your bladder, alcohol, spicy foods, foods high in acid and drinks with caffeine may cause irritation or frequency and should be used in moderation. To keep your urine flowing freely and avoid constipation, drink plenty of fluids during the day (8-10 glasses). Tip: Avoid cranberry juice because it is very acidic.  Activity:  Your physical activity doesn't need to be restricted. However, if you are very active, you may see some blood in the urine. We suggest that you reduce your activity under the circumstances until the bleeding has stopped.  Bowels:  It is important to keep your bowels regular during the postoperative period. Straining with bowel movements can cause bleeding. A bowel movement every other day is reasonable. Use a mild laxative if needed, such as milk of magnesia 2-3 tablespoons, or 2 Dulcolax tablets. Call if you continue to have problems. If you had been taking narcotics for pain, before, during or after your surgery, you may be constipated. Take a laxative if necessary.    Medication:  You should resume your pre-surgery medications unless told not to. In addition you may be given an antibiotic to prevent or treat infection. Antibiotics are not always necessary. All medication should be taken as prescribed until the bottles are finished unless you are having   an unusual reaction to one of the drugs.    

## 2021-03-17 NOTE — Op Note (Signed)
PATIENT:  [name]  PRE-OPERATIVE DIAGNOSIS: history of bladder cancer   POST-OPERATIVE DIAGNOSIS: Same  PROCEDURE:  cystoscopy, bladder biopsy, fulguration  SURGEON:  Jacalyn Lefevre, MD  ANESTHESIA:   General  EBL:  Minimal  DRAINS: Urethral catheter (18 Fr. Foley)   SPECIMEN:  posterior wall bladder biopsy  DISPOSITION OF SPECIMEN:  PATHOLOGY  FINDINGS: Normal anterior 2.   Trilobar prostatic hypertrophy with intravesical median lobe 3.  Moderate to severe trabeculations 4.  Well-healed prior TUR scar on posterior superior bladder wall with adjacent erythema 5.  Bilateral orthotopic ureteral orifices  Indication: 87 year old man with a history of high-grade Ta bladder cancer with difficult visualization on surveillance cystoscopy.  He is here today for diagnostic cystoscopy with possible biopsy.  Description of operation: The patient was taken to the operating room and administered general anesthesia. They were then placed on the table and moved to the dorsal lithotomy position after which the genitalia was sterilely prepped and draped. An official timeout was then performed.  Urethral sounds were used to dilate the patient's meatus from 22 [place] to 30 [place].  Next a 36 French rigid cystoscope was placed in the urethral meatus and advanced in the bladder and direct visualization.  Findings noted above.  There is significant debris in the bladder so the cystoscope was removed and the resectoscope was replaced.  The [place] syringe was used to irrigate out the debris.  Next inspection of the bladder revealed erythematous area adjacent to the prior TUR scar in the posterior superior bladder wall.  A cold cup bladder biopsy was then used to take 2 biopsies of this area.  Next the bipolar loop was used to fulgurate the area.  Hemostasis deemed adequate with the irrigant turned off.  This resectoscope was removed.  A 18 French coud Foley catheter was then placed with 10 cc sterile  water in the balloon.  Patient was awakened from anesthesia transferred PACU in stable condition.  PLAN OF CARE: Patient to keep Foley catheter for 1 week.  PATIENT DISPOSITION:  PACU - hemodynamically stable.

## 2021-03-17 NOTE — Anesthesia Preprocedure Evaluation (Signed)
Anesthesia Evaluation  Patient identified by MRN, date of birth, ID band Patient awake    Reviewed: Allergy & Precautions, NPO status , Patient's Chart, lab work & pertinent test results  Airway Mallampati: II  TM Distance: >3 FB Neck ROM: Full    Dental  (+) Partial Upper   Pulmonary shortness of breath (2L overnight) and Long-Term Oxygen Therapy, former smoker,    Pulmonary exam normal        Cardiovascular hypertension, Pt. on medications and Pt. on home beta blockers + CAD, + Past MI, + Cardiac Stents, + CABG (2001) and +CHF   Rhythm:Regular Rate:Normal     Neuro/Psych negative neurological ROS  negative psych ROS   GI/Hepatic hiatal hernia, GERD  Medicated,  Endo/Other  diabetes, Well Controlled, Type 2  Renal/GU CRFRenal disease Bladder dysfunction      Musculoskeletal  (+) Arthritis , Osteoarthritis,    Abdominal Normal abdominal exam  (+)   Peds  Hematology  (+) anemia ,   Anesthesia Other Findings   Reproductive/Obstetrics                             Anesthesia Physical Anesthesia Plan  ASA: 3  Anesthesia Plan: General   Post-op Pain Management:    Induction: Intravenous  PONV Risk Score and Plan: 2 and Ondansetron, Dexamethasone and Treatment may vary due to age or medical condition  Airway Management Planned: Mask and LMA  Additional Equipment: None  Intra-op Plan:   Post-operative Plan: Extubation in OR  Informed Consent: I have reviewed the patients History and Physical, chart, labs and discussed the procedure including the risks, benefits and alternatives for the proposed anesthesia with the patient or authorized representative who has indicated his/her understanding and acceptance.     Dental advisory given  Plan Discussed with: CRNA  Anesthesia Plan Comments: (Lab Results      Component                Value               Date                      WBC                       9.0                 03/10/2021                HGB                      11.8 (L)            03/10/2021                HCT                      37.8 (L)            03/10/2021                MCV                      85.3                03/10/2021                PLT  283                 03/10/2021           Lab Results      Component                Value               Date                      NA                       140                 03/10/2021                K                        5.0                 03/10/2021                CO2                      26                  03/10/2021                GLUCOSE                  249 (H)             03/10/2021                BUN                      55 (H)              03/10/2021                CREATININE               3.26 (H)            03/10/2021                CALCIUM                  9.6                 03/10/2021                GFRNONAA                 18 (L)              03/10/2021          )        Anesthesia Quick Evaluation

## 2021-03-18 ENCOUNTER — Telehealth: Payer: Self-pay

## 2021-03-18 ENCOUNTER — Ambulatory Visit: Payer: Medicare Other | Admitting: Neurology

## 2021-03-18 ENCOUNTER — Encounter (HOSPITAL_COMMUNITY): Payer: Self-pay | Admitting: Urology

## 2021-03-18 NOTE — Telephone Encounter (Signed)
Left message on VM checking on pt after cystoscope yesterday.

## 2021-03-18 NOTE — Anesthesia Postprocedure Evaluation (Signed)
Anesthesia Post Note  Patient: GOLDMAN BIRCHALL  Procedure(s) Performed: DIAGNOSTIC CYSTOSCOPY WITH BIOPSY TRANSURETHRAL RESECTION OF BLADDER TUMOR (TURBT)     Patient location during evaluation: PACU Anesthesia Type: General Level of consciousness: awake and alert Pain management: pain level controlled Vital Signs Assessment: post-procedure vital signs reviewed and stable Respiratory status: spontaneous breathing, nonlabored ventilation, respiratory function stable and patient connected to nasal cannula oxygen Cardiovascular status: blood pressure returned to baseline and stable Postop Assessment: no apparent nausea or vomiting Anesthetic complications: no   No notable events documented.  Last Vitals:  Vitals:   03/17/21 1415 03/17/21 1436  BP: (!) 178/83 (!) 189/82  Pulse: 76 80  Resp: 18 (!) 21  Temp: 36.7 C   SpO2: 96% 96%    Last Pain:  Vitals:   03/17/21 1436  TempSrc:   PainSc: 0-No pain                 Belenda Cruise P Tristian Bouska

## 2021-03-19 LAB — SURGICAL PATHOLOGY

## 2021-03-23 DIAGNOSIS — C674 Malignant neoplasm of posterior wall of bladder: Secondary | ICD-10-CM | POA: Diagnosis not present

## 2021-03-24 ENCOUNTER — Telehealth: Payer: Self-pay | Admitting: Internal Medicine

## 2021-03-24 NOTE — Telephone Encounter (Signed)
Pt's daughter has called with concerns of the HYDROCODONE medication. Currently the patient takes 2 in the morning and 2 at night but she belives this may be the reason he does not feel well (nauseated).  What is the recommended intake for the medication? Please advise [name]- [phone]

## 2021-03-24 NOTE — Telephone Encounter (Signed)
Spoke to [employer]. Suggested she reach out to Dr [name] who wrote the rx. He may want to change the medication.

## 2021-03-25 ENCOUNTER — Other Ambulatory Visit: Payer: Self-pay

## 2021-03-25 ENCOUNTER — Telehealth: Payer: Self-pay | Admitting: Primary Care

## 2021-03-25 ENCOUNTER — Encounter: Payer: Self-pay | Admitting: Primary Care

## 2021-03-25 ENCOUNTER — Telehealth (INDEPENDENT_AMBULATORY_CARE_PROVIDER_SITE_OTHER): Payer: Medicare Other | Admitting: Primary Care

## 2021-03-25 DIAGNOSIS — J479 Bronchiectasis, uncomplicated: Secondary | ICD-10-CM | POA: Diagnosis not present

## 2021-03-25 DIAGNOSIS — R0600 Dyspnea, unspecified: Secondary | ICD-10-CM | POA: Diagnosis not present

## 2021-03-25 DIAGNOSIS — I25110 Atherosclerotic heart disease of native coronary artery with unstable angina pectoris: Secondary | ICD-10-CM

## 2021-03-25 NOTE — Telephone Encounter (Signed)
Can you review HRCT and labs. Looks like scarring/bronchiectasis are stable, no evidence of ILD.  Right pleural thickening calcification also stable. He has severe calcifications of aortic valve along with left main and three vessel disease. Chronic shortness of breath, no interested in morphine for symptom management at this time. He does not have a purulent or productive cough. CK 35, Acetylchol block ab 28%.   Anything further work of needed or recommendations?

## 2021-03-25 NOTE — Progress Notes (Signed)
Virtual Visit via Video Note  I connected with [name] on 03/25/21 at  2:00 PM EST by a video enabled telemedicine application and verified that I am speaking with the correct person using two identifiers.  Location: Patient: Home Provider: Office    I discussed the limitations of evaluation and management by telemedicine and the availability of in person appointments. The patient expressed understanding and agreed to proceed.  History of Present Illness:   Previous [facility] pulmonary encounter: 02/20/21- Dr. Chase Caller     Chief Complaint  Patient presents with   Follow-up      Pt states his breathing has gotten worse since last visit. States that he has an occ cough.      Follow-up rheumatoid arthritis with mixed restrictive/obstructive lung function slightly low DLCO and significant shortness of breath             -High-resolution CT chest mild ILD (also has velcro crackles at lung base) with air trapping according to Dr. Chase Caller indeterminate/alternative pattern              -Cylindrical bronchiectasis without evidence of fibrotic lung disease per radiology   Out of proportion dyspnea   HPI [name] 87 y.o. -presents for follow-up.  He presents with his wife and daughter.  Meeting daughter I think for the first time or after a long time.  He again tells me that as always that his dyspnea is getting worse.  He is also more physically deconditioned.  Daughter states he is mostly sedentary does not do any household work.  He does things around the house.  They all agree that the dyspnea is definitely worse.  Daughter wondered if any of the medicines can directly contribute to dyspnea.  I reviewed the medications patient does not have Brilinta but on Plavix.  He is on antihypertensives.  He review of his medical chart indicates that he has diastolic dysfunction, arctic stenosis, anemia, chronic kidney disease - -all of which can contribute to dyspnea.  He also has anemia.  In  addition he is more progressively physically deconditioned his albumin is now less than 4 and he is on protein calorie malnutrition.  He when he walks around the house he uses oxygen for relief but he never desaturates.  This for subjectively.  He continues with nighttime oxygen.  I explained that sometimes for palliative relief of dyspnea morphine would be indicated.  They are not enthused about this option.  At this point in time we will get more work-up done.  Did explain to the multifactorial nature of dyspnea can be hard to solve.   CT Chest data   No results found.   03/25/2021- Interim hx  Patient presents today for virtual visit to review recent CT results. Accompanied by his daughter. He is currently at his baseline. He continues to have progressive dyspnea symptoms. He has not active cough, chest tightness or wheezing. He uses oxygen as needed during daytime and at night. They are not interested in morphine for palliative relief at this time. CT chest showed no evidence of ILD, widespread bronchiectasis most evident throughout mid-lower lungs bilaterally and mild scarring are similar to previous study. Chronic right pleural thickening calcification, similar to prior study. Incidental findings aortic atherosclerosis, left main and three vessel disease. Along with severe calcification of aortic valve. We reviewed these results today.    Observations/Objective:  Patient is alert and oriented. HOH. Appears tired laying in chair but no respiratory distress. Not  wearing supplemental oxygen.   Imaging: 03/04/21 HRCT>> Chronic cylindrical bronchiectasis and mild scarring in the lungs, similar to the prior study. No evidence of interstitial lung disease. Mild air trapping indicative of mild small airways disease. Right pleural thickening calcification, presumably related to remote right-sided pleural hemorrhage and/or pleural infection. Aortic atherosclerosis, in addition to left main and 3 vessel  coronary artery disease. There are extremely severe calcifications of the aortic valve  02/06/2019 CT chest Cylindrical bronchiectasis without evidence of fibrotic interstitial lung disease. 2. Air trapping is indicative of small airways disease. 3.  Aortic atherosclerosis (ICD10-170.0).  01/29/21 Echocardiogram >> EF 60-65%. Grade 1 diastolic dysfunction. Moderate calcification of aortic valve. Mild-moderate aortic valve stenosis.   Labs: 02/20/21>> CK was 35; Acetylcholine binding ab 28%   Assessment and Plan:  Dyspnea: - Progressive dyspnea, no interested in morphine for symptom management at this time. Scarring/bronchiectasis are stable, no evidence of ILD.  Right pleural thickening calcification also stable. He has severe calcifications of aortic valve along with left main and three vessel disease. No change to plan. Encourage patient use supplemental oxygen   Bronchiectasis: - Does not appear exacerbated. He does not have a purulent or productive cough. Low threshold for mucolytic's and abx if needed.   CAD: - Likely contributing to dyspnea, currently off statin regimen. Recommend he follow-up with cardiology.  Follow Up Instructions:   - 3 months with Dr. Judd Gaudier or sooner if needed  I discussed the assessment and treatment plan with the patient. The patient was provided an opportunity to ask questions and all were answered. The patient agreed with the plan and demonstrated an understanding of the instructions.   The patient was advised to call back or seek an in-person evaluation if the symptoms worsen or if the condition fails to improve as anticipated.  I provided 25 minutes of non-face-to-face time during this encounter.   Martyn Ehrich, NP

## 2021-03-31 DIAGNOSIS — N184 Chronic kidney disease, stage 4 (severe): Secondary | ICD-10-CM | POA: Diagnosis not present

## 2021-04-01 NOTE — Telephone Encounter (Signed)
The acetylcholine receptor antibody slightly positive.  It is a low titer positivity.  I do not know what this means but I did read that it could be other neuromuscular syndromes  The CT chest is reassuring  Unknown for over 12 years and through all this he has had persistent refractory dyspnea over and above what we can explain with his multiple medical problems.  Plan - Refer to neurology -so we can ensure there is no neuromuscular issues causing dyspnea

## 2021-04-03 NOTE — Telephone Encounter (Signed)
Raquel Sarna can you please let patient know Dr. Chase Caller reviewed CT chest which is reassuring. Acetylcholine receptor slightly positive, he wanted [place] to refer to neurology to ensure there is no underlying neuromuscular disease causes dyspnea symptoms. Can you please place referral. Thanks

## 2021-04-03 NOTE — Telephone Encounter (Signed)
Called and spoke with pt letting him know the info per BW and MR and pt verbalized understanding. Pt already has an upcoming appt with neurology 2/17. Nothing further needed.

## 2021-04-07 ENCOUNTER — Other Ambulatory Visit: Payer: Self-pay

## 2021-04-07 ENCOUNTER — Emergency Department (HOSPITAL_COMMUNITY)
Admission: EM | Admit: 2021-04-07 | Discharge: 2021-04-07 | Disposition: A | Discharge status: Home / Self Care | Payer: Medicare Other | Attending: Student | Admitting: Student

## 2021-04-07 ENCOUNTER — Emergency Department (HOSPITAL_COMMUNITY): Payer: Medicare Other

## 2021-04-07 ENCOUNTER — Encounter (HOSPITAL_COMMUNITY): Payer: Self-pay

## 2021-04-07 DIAGNOSIS — Z8551 Personal history of malignant neoplasm of bladder: Secondary | ICD-10-CM | POA: Insufficient documentation

## 2021-04-07 DIAGNOSIS — R300 Dysuria: Secondary | ICD-10-CM | POA: Diagnosis not present

## 2021-04-07 DIAGNOSIS — D72829 Elevated white blood cell count, unspecified: Secondary | ICD-10-CM | POA: Insufficient documentation

## 2021-04-07 DIAGNOSIS — Z79899 Other long term (current) drug therapy: Secondary | ICD-10-CM | POA: Diagnosis not present

## 2021-04-07 DIAGNOSIS — R109 Unspecified abdominal pain: Secondary | ICD-10-CM | POA: Diagnosis present

## 2021-04-07 DIAGNOSIS — N200 Calculus of kidney: Secondary | ICD-10-CM

## 2021-04-07 DIAGNOSIS — Z7982 Long term (current) use of aspirin: Secondary | ICD-10-CM | POA: Diagnosis not present

## 2021-04-07 DIAGNOSIS — N3001 Acute cystitis with hematuria: Secondary | ICD-10-CM | POA: Diagnosis not present

## 2021-04-07 DIAGNOSIS — N202 Calculus of kidney with calculus of ureter: Secondary | ICD-10-CM | POA: Diagnosis not present

## 2021-04-07 DIAGNOSIS — Z7902 Long term (current) use of antithrombotics/antiplatelets: Secondary | ICD-10-CM | POA: Diagnosis not present

## 2021-04-07 DIAGNOSIS — N201 Calculus of ureter: Secondary | ICD-10-CM | POA: Diagnosis not present

## 2021-04-07 DIAGNOSIS — K4090 Unilateral inguinal hernia, without obstruction or gangrene, not specified as recurrent: Secondary | ICD-10-CM | POA: Diagnosis not present

## 2021-04-07 LAB — URINALYSIS, MICROSCOPIC (REFLEX)
Bacteria, UA: NONE SEEN
RBC / HPF: >50 RBC/hpf (ref 0–5)
WBC, UA: >50 WBC/hpf (ref 0–5)

## 2021-04-07 LAB — URINALYSIS, ROUTINE W REFLEX MICROSCOPIC
Bilirubin Urine: NEGATIVE
Glucose, UA: 250 mg/dL — AB
Ketones, ur: NEGATIVE mg/dL
Nitrite: POSITIVE — AB
Protein, ur: 100 mg/dL — AB
Specific Gravity, Urine: 1.015 (ref 1.005–1.030)
pH: 6 (ref 5.0–8.0)

## 2021-04-07 LAB — CBC WITH DIFFERENTIAL/PLATELET
Abs Immature Granulocytes: 0.05 10*3/uL (ref 0.00–0.07)
Basophils Absolute: 0 10*3/uL (ref 0.0–0.1)
Basophils Relative: 0 %
Eosinophils Absolute: 0.1 10*3/uL (ref 0.0–0.5)
Eosinophils Relative: 1 %
HCT: 40.7 % (ref 39.0–52.0)
Hemoglobin: 13.1 g/dL (ref 13.0–17.0)
Immature Granulocytes: 1 %
Lymphocytes Relative: 16 %
Lymphs Abs: 1.7 10*3/uL (ref 0.7–4.0)
MCH: 26.5 pg (ref 26.0–34.0)
MCHC: 32.2 g/dL (ref 30.0–36.0)
MCV: 82.2 fL (ref 80.0–100.0)
Monocytes Absolute: 0.7 10*3/uL (ref 0.1–1.0)
Monocytes Relative: 7 %
Neutro Abs: 8.2 10*3/uL — ABNORMAL HIGH (ref 1.7–7.7)
Neutrophils Relative %: 75 %
Platelets: 270 10*3/uL (ref 150–400)
RBC: 4.95 MIL/uL (ref 4.22–5.81)
RDW: 15.1 % (ref 11.5–15.5)
WBC: 10.8 10*3/uL — ABNORMAL HIGH (ref 4.0–10.5)
nRBC: 0 % (ref 0.0–0.2)

## 2021-04-07 LAB — COMPREHENSIVE METABOLIC PANEL
ALT: 13 U/L (ref 0–44)
AST: 18 U/L (ref 15–41)
Albumin: 3.6 g/dL (ref 3.5–5.0)
Alkaline Phosphatase: 90 U/L (ref 38–126)
Anion gap: 12 (ref 5–15)
BUN: 42 mg/dL — ABNORMAL HIGH (ref 8–23)
CO2: 25 mmol/L (ref 22–32)
Calcium: 10.1 mg/dL (ref 8.9–10.3)
Chloride: 99 mmol/L (ref 98–111)
Creatinine, Ser: 2.54 mg/dL — ABNORMAL HIGH (ref 0.61–1.24)
GFR, Estimated: 24 mL/min — ABNORMAL LOW (ref >60)
Glucose, Bld: 319 mg/dL — ABNORMAL HIGH (ref 70–99)
Potassium: 4.7 mmol/L (ref 3.5–5.1)
Sodium: 136 mmol/L (ref 135–145)
Total Bilirubin: 0.9 mg/dL (ref 0.3–1.2)
Total Protein: 8.6 g/dL — ABNORMAL HIGH (ref 6.5–8.1)

## 2021-04-07 LAB — CBG MONITORING, ED: Glucose-Capillary: 214 mg/dL — ABNORMAL HIGH (ref 70–99)

## 2021-04-07 MED ORDER — DOXYCYCLINE HYCLATE 100 MG PO TABS
100.0000 mg | ORAL_TABLET | Freq: Once | ORAL | Status: AC
  Administered 2021-04-07: 100 mg via ORAL
  Filled 2021-04-07: qty 1

## 2021-04-07 MED ORDER — DOXYCYCLINE HYCLATE 100 MG PO CAPS: 100.0000 mg | ORAL_CAPSULE | Freq: Two times a day (BID) | ORAL | 0 refills | Status: DC

## 2021-04-07 MED ORDER — SODIUM CHLORIDE 0.9 % IV SOLN
1.0000 g | Freq: Once | INTRAVENOUS | Status: AC
  Administered 2021-04-07: 1 g via INTRAVENOUS
  Filled 2021-04-07: qty 10

## 2021-04-07 NOTE — ED Provider Notes (Signed)
Emergency Medicine Provider Triage Evaluation Note  [name] , a 88 y.o. male  was evaluated in triage.  Pt complains of hyperglycemia and dysuria. States that his symptoms have been ongoing for the past 2 weeks. Denies any pain. States that he had surgery for bladder cancer with chemo a few months ago and had hematuria up until 3 weeks ago. Denies fevers, chills, n/v/d.  Review of Systems  Positive:  Negative: See above  Physical Exam  BP 140/73 (BP Location: Left Arm)    Pulse 98    Temp 98.8 F (37.1 C) (Oral)    Resp 20    SpO2 98%  Gen:   Awake, no distress   Resp:  Normal effort  MSK:   Moves extremities without difficulty  Other:    Medical Decision Making  Medically screening exam initiated at 12:06 PM.  Appropriate orders placed.  [name] was informed that the remainder of the evaluation will be completed by another provider, this initial triage assessment does not replace that evaluation, and the importance of remaining in the ED until their evaluation is complete.     [name] 04/07/21 1208    [name], MD 04/08/21 1425

## 2021-04-07 NOTE — ED Notes (Signed)
Pt verbalized understanding of d/c instructions, meds, and followup care. Denies questions. VSS, no distress noted. Steady gait to exit with all belongings. Assisted to wheelchair and out to neighbor's car.

## 2021-04-07 NOTE — ED Provider Notes (Signed)
[facility] EMERGENCY DEPARTMENT Provider Note   CSN: [id number] Arrival date & time: 04/07/21  1106     History  Chief Complaint  Patient presents with   dysuria/ hyerglycemia    [name] is a 88 y.o. male who presents emergency department for evaluation of right flank pain and dysuria.  Patient states that he recently had a cystoscopy and bladder cancer removal in the middle of December.  He had a UTI immediately following that and initially took Keflex.  Neri symptoms have since returned but he denies fever, chest pain, shortness of breath, nausea, vomiting or any other systemic symptoms.  HPI     Home Medications Prior to Admission medications   Medication Sig Start Date End Date Taking? Authorizing Provider  doxycycline (VIBRAMYCIN) 100 MG capsule Take 1 capsule (100 mg total) by mouth 2 (two) times daily. 04/07/21  Yes Lyrical Sowle, MD  amLODipine (NORVASC) 5 MG tablet Take 5 mg by mouth at bedtime. 08/06/19   [provider]  aspirin EC 81 MG tablet Take 81 mg by mouth at bedtime.    [provider]  Blood Glucose Monitoring Suppl (ONE TOUCH ULTRA 2) w/Device KIT Use to obtain blood sugar daily. Dx Code E11.40 04/12/19   Viviana Simpler I, MD  carvedilol (COREG) 6.25 MG tablet Take 6.25 mg by mouth 2 (two) times daily. 11/15/19   [provider]  clopidogrel (PLAVIX) 75 MG tablet Take 1 tablet (75 mg total) by mouth daily. 03/06/21   [name], MD  furosemide (LASIX) 40 MG tablet Take 40 mg by mouth every Monday, Wednesday, and Friday.    [provider]  glucose blood (ONE TOUCH ULTRA TEST) test strip USE TO CHECK BLOOD SUGAR ONCE A DAY Dx Code E11.40 01/29/20   [name], MD  HYDROcodone-acetaminophen (NORCO) 10-325 MG tablet Take 1 tablet by mouth every 6 (six) hours as needed for severe pain. Must last 30 days. Patient taking differently: Take 1-2 tablets by mouth 2 (two) times daily as needed for severe  pain. Must last 30 days. 03/11/21 04/10/21  Gillis Santa, MD  HYDROcodone-acetaminophen (NORCO) 10-325 MG tablet Take 1 tablet by mouth every 6 (six) hours as needed for severe pain. Must last 30 days. 04/10/21 05/10/21  Gillis Santa, MD  HYDROcodone-acetaminophen (NORCO) 10-325 MG tablet Take 1 tablet by mouth every 6 (six) hours as needed for severe pain. Must last 30 days. 05/10/21 06/09/21  Gillis Santa, MD  Multiple Vitamins-Minerals (ICAPS AREDS 2 PO) Take 1 capsule by mouth daily.    [provider]  nitroGLYCERIN (NITROSTAT) 0.4 MG SL tablet DISSOLVE 1 TABLET UNDER TONGUE AS NEEDEDFOR CHEST PAIN. MAY REPEAT 5 MINUTES APART 3 TIMES IF NEEDED. IF NO RELIEF CALL 911 01/19/21   [name], MD  OneTouch Delica Lancets 34L MISC 1 each by In Vitro route daily. Dx Code E11.49 01/29/20   Viviana Simpler I, MD  OXYGEN Inhale 2 L into the lungs at bedtime. Per family Uses Oxygen 2 liters sometimes during the day    [provider]  pantoprazole (PROTONIX) 40 MG tablet TAKE 1 TABLET BY MOUTH TWICE (2) DAILY 07/23/20   [name], MD  polyethylene glycol (MIRALAX / GLYCOLAX) 17 g packet Take 17 g by mouth every other day.    [provider]  pregabalin (LYRICA) 25 MG capsule Take 1 capsule (25 mg total) by mouth 2 (two) times daily. 03/05/21   Gillis Santa, MD  pregabalin (LYRICA) 50 MG capsule Take 50 mg by mouth at bedtime.    [provider]  tamsulosin (FLOMAX) 0.4 MG CAPS capsule Take 0.4 mg by mouth at bedtime. 01/31/19   [provider]      Allergies    Metronidazole, Doxazosin mesylate, and Methocarbamol    Review of Systems   Review of Systems  Genitourinary:  Positive for dysuria and flank pain.   Physical Exam Updated Vital Signs BP (!) 161/117    Pulse 91    Temp 98.5 F (36.9 C) (Oral)    Resp 18    SpO2 100%  Physical Exam Vitals and nursing note reviewed.  Constitutional:      General: He is not in acute distress.     Appearance: He is well-developed.  HENT:     Head: Normocephalic and atraumatic.  Eyes:     Conjunctiva/sclera: Conjunctivae normal.  Cardiovascular:     Rate and Rhythm: Normal rate and regular rhythm.     Heart sounds: No murmur heard. Pulmonary:     Effort: Pulmonary effort is normal. No respiratory distress.     Breath sounds: Normal breath sounds.  Abdominal:     Palpations: Abdomen is soft.     Tenderness: There is abdominal tenderness (Suprapubic). There is right CVA tenderness.  Musculoskeletal:        General: No swelling.     Cervical back: Neck supple.  Skin:    General: Skin is warm and dry.     Capillary Refill: Capillary refill takes less than 2 seconds.  Neurological:     Mental Status: He is alert.  Psychiatric:        Mood and Affect: Mood normal.    ED Results / Procedures / Treatments   Labs (all labs ordered are listed, but only abnormal results are displayed) Labs Reviewed  CBC WITH DIFFERENTIAL/PLATELET - Abnormal; Notable for the following components:      Result Value   WBC 10.8 (*)    Neutro Abs 8.2 (*)    All other components within normal limits  COMPREHENSIVE METABOLIC PANEL - Abnormal; Notable for the following components:   Glucose, Bld 319 (*)    BUN 42 (*)    Creatinine, Ser 2.54 (*)    Total Protein 8.6 (*)    GFR, Estimated 24 (*)    All other components within normal limits  URINALYSIS, ROUTINE W REFLEX MICROSCOPIC - Abnormal; Notable for the following components:   Glucose, UA 250 (*)    Hgb urine dipstick LARGE (*)    Protein, ur 100 (*)    Nitrite POSITIVE (*)    Leukocytes,Ua MODERATE (*)    All other components within normal limits  CBG MONITORING, ED - Abnormal; Notable for the following components:   Glucose-Capillary 214 (*)    All other components within normal limits  URINALYSIS, MICROSCOPIC (REFLEX)    EKG None  Radiology CT Renal Stone Study  Result Date: 04/07/2021 CLINICAL DATA:  Flank pain.  Dysuria. EXAM:  CT ABDOMEN AND PELVIS WITHOUT CONTRAST TECHNIQUE: Multidetector CT imaging of the abdomen and pelvis was performed following the standard protocol without IV contrast. COMPARISON:  January 27, 2021. FINDINGS: Lower chest: No acute abnormality. Hepatobiliary: No focal liver abnormality is seen. Status post cholecystectomy. No biliary dilatation. Pancreas: Unremarkable. No pancreatic ductal dilatation or surrounding inflammatory changes. Spleen: Normal in size without focal abnormality. Adrenals/Urinary Tract: Adrenal glands appear normal. Large left renal cyst is noted. Small nonobstructive left renal  calculus is noted. Mild right ureteral dilatation is noted secondary to 6 mm calculus seen in distal right ureter. Mild urinary bladder wall thickening is noted concerning for possible cystitis. Stomach/Bowel: Stomach appears normal. There is no evidence of bowel obstruction or inflammation. Large right inguinal hernia is noted which contains loops of small bowel, but does not result in obstruction. Vascular/Lymphatic: Aortic atherosclerosis. No enlarged abdominal or pelvic lymph nodes. Reproductive: Moderate prostatic enlargement is noted. Other: Small fat containing left inguinal hernia is noted. No ascites is noted. Musculoskeletal: No acute or significant osseous findings. IMPRESSION: Mild right ureteral dilatation is noted secondary to 6 mm calculus seen in dilated distal right ureter. Mild urinary bladder wall thickening is noted concerning for possible cystitis. Small nonobstructive left renal calculus. Moderate prostatic enlargement. Large right inguinal hernia is again noted which contains multiple loops of small bowel, but does not result in obstruction. Aortic Atherosclerosis (ICD10-I70.0). Electronically Signed   By: Marijo Conception M.D.   On: 04/07/2021 21:38    Procedures Procedures  I reviewed the cardiac monitor.  Normal sinus rhythm.  No elevations or depressions, no dysrhythmia  Medications  Ordered in ED Medications  cefTRIAXone (ROCEPHIN) 1 g in sodium chloride 0.9 % 100 mL IVPB (0 g Intravenous Stopped 04/07/21 2200)  doxycycline (VIBRA-TABS) tablet 100 mg (100 mg Oral Given 04/07/21 2112)    ED Course/ Medical Decision Making/ A&P                           Medical Decision Making  Patient seen emergency department for evaluation of flank pain and dysuria.  Physical exam reveals right CVA tenderness as well as suprapubic tenderness, nontender abdominal hernia but is otherwise unremarkable.  Laboratory evaluation with a mild leukocytosis to 10.8, creatinine elevated to 2.54 which is near the patient's baseline, glucose elevated at 319 and repeat POC glucose here is 214.  Urinalysis with positive nitrites, moderate leukoesterase, greater than 50 white blood cells, greater than 50 red blood cells.  A CT stone study was performed showing an obstructive 6 mm ureteral calculus and evidence of cystitis as well as a large abdominal hernia but no evidence of obstruction.  I personally reviewed old culture data showing that the patient grew fairly resistant staph hemolyticus and I spoke with urology about appropriate antibiotic choices and his new kidney stone.  Urology is recommending initiation of doxycycline and he will closely follow the patient in the outpatient setting in the setting of the stone.  He does not believe the patient is suffering from septic stone I agree with this.  Patient is requesting discharge and he was discharged with a prescription for doxycycline and close urologic follow-up.        Final Clinical Impression(s) / ED Diagnoses Final diagnoses:  Acute cystitis with hematuria  Kidney stone    Rx / DC Orders ED Discharge Orders          Ordered    doxycycline (VIBRAMYCIN) 100 MG capsule  2 times daily        04/07/21 2150              Grabiela Wohlford, Debe Coder, MD 04/08/21 0010

## 2021-04-07 NOTE — ED Triage Notes (Signed)
Patient complains of 2 weeks of dysuria and hyperglycemia. Patient states I have a kidney infection. Reports some difficulty with urination but voided prior to triage

## 2021-04-14 ENCOUNTER — Telehealth: Payer: Self-pay

## 2021-04-14 NOTE — Telephone Encounter (Signed)
I spoke with pt and he has had dizziness on and off for 1 - 2 wks. I asked pt who asked wife what his BP was earlier and she said 169/66 then changed to 139/66.No available appts at [facility] on 04/15/21; Larene Beach CMA said Dr Silvio Pate who is out of office now has not mentioned any add ons on 04/15/21. Pt said  he cannot go to [facility]; I scheduled pt an appt on 04/15/21 at [facility] on 04/15/21 at 12noon. UC & ED precautions given and pt voiced understanding. Sending note to Dr Silvio Pate and Larene Beach CMA.

## 2021-04-14 NOTE — Telephone Encounter (Signed)
[facility] - Client TELEPHONE ADVICE RECORD [facility]urse Patient Name: [name] Gender: Male DOB: 06/14/1931 Age: 88 Y 34 M 27 D Return Phone Number: [phone] (Primary), [phone] (Secondary) Address: [place]  [address] Client [facility] - Client Client Site [facility] [place] - Day Provider Viviana Simpler- MD Contact Type Call Who Is Calling Patient / Member / Family / Caregiver Call Type Triage / Clinical Relationship To Patient Self Return Phone Number [phone] (Primary) Chief Complaint Dizziness Reason for Call Symptomatic / Request for [name] states he has been feeling dizzy lately. Additional Comment Did not speak with the patient, only the office. Translation No Nurse Assessment Nurse: Linward Headland, RN, Ana Date/Time (Eastern Time): 04/14/2021 3:12:47 PM Confirm and document reason for call. If symptomatic, describe symptoms. ---Caller states he has been feeling dizzy lately, has been being treated for cancer, he is currently cancer free after treatment. States last Tuesday he was diagnosed with bladder infection and kidney stone. States he is currently on ABX, has 3 more days left. States since then has been feeling dizzy. Does the patient have any new or worsening symptoms? ---Yes Will a triage be completed? ---Yes Related visit to physician within the last 2 weeks? ---Yes Does the PT have any chronic conditions? (i.e. diabetes, asthma, this includes High risk factors for pregnancy, etc.) ---Yes List chronic conditions. ---bladder cancer, diabetes, HTN Is this a behavioral health or substance abuse call? ---No Guidelines Guideline Title Affirmed Question Affirmed Notes Nurse Date/Time (Eastern Time) Dizziness - Lightheadedness [1] MODERATE dizziness (e.g., interferes with normal activities) AND [2] has NOT been  evaluated by physician for this (Exception: dizziness Linward Headland, RN, Wilhemena Durie 04/14/2021 3:15:37 PM PLEASE NOTE: All timestamps contained within this report are represented as [place] Standard Time. CONFIDENTIALTY NOTICE: This fax transmission is intended only for the addressee. It contains information that is legally privileged, confidential or otherwise protected from use or disclosure. If you are not the intended recipient, you are strictly prohibited from reviewing, disclosing, copying using or disseminating any of this information or taking any action in reliance on or regarding this information. If you have received this fax in error, please notify us immediately by telephone so that we can arrange for its return to [place]. Phone: [phone], Toll-Free: [phone], Fax: [phone] Page: 2 of 2 Call Id: [id number] Guidelines Guideline Title Affirmed Question Affirmed Notes Nurse Date/Time Eilene Ghazi Time) caused by heat exposure, sudden standing, or poor fluid intake) Disp. Time Eilene Ghazi Time) Disposition Final User 04/14/2021 3:03:03 PM Attempt made - message left Meyer Cory 04/14/2021 3:23:06 PM See PCP within 24 Hours Yes Linward Headland, RN, Dennis Disagree/Comply Comply Caller Understands Yes PreDisposition Search internet for information Care Advice Given Per Guideline SEE PCP WITHIN 24 HOURS: * IF OFFICE WILL BE OPEN: You need to be examined within the next 24 hours. Call your doctor (or NP/PA) when the office opens and make an appointment. DRINK FLUIDS: * Drink several glasses of fruit juice, other clear fluids or water. * This will improve hydration and blood glucose. * If the weather is hot or you have a fever, make sure the fluids are cold. LIE DOWN AND REST: * Lie down with feet elevated for 1 hour. * This will improve circulation and increase blood flow to the brain. CALL BACK IF: * Passes out (faints) * You become worse CARE ADVICE given per Dizziness (Adult)  guideline. Comments User:  Jodelle Green, RN Date/Time Eilene Ghazi Time): 04/14/2021 3:22:52 PM Transferred caller so he could make appt within 24 hours. Referrals REFERRED TO PCP OFFIC

## 2021-04-15 ENCOUNTER — Telehealth: Payer: Self-pay

## 2021-04-15 ENCOUNTER — Ambulatory Visit
Admission: RE | Admit: 2021-04-15 | Discharge: 2021-04-15 | Disposition: A | Discharge status: Home / Self Care | Payer: Medicare Other | Source: Ambulatory Visit | Attending: Medical Oncology | Admitting: Medical Oncology

## 2021-04-15 VITALS — BP 118/69 | HR 70 | Temp 98.2°F | Resp 18

## 2021-04-15 DIAGNOSIS — R42 Dizziness and giddiness: Secondary | ICD-10-CM | POA: Diagnosis not present

## 2021-04-15 DIAGNOSIS — C679 Malignant neoplasm of bladder, unspecified: Secondary | ICD-10-CM

## 2021-04-15 DIAGNOSIS — R531 Weakness: Secondary | ICD-10-CM

## 2021-04-15 DIAGNOSIS — Z87440 Personal history of urinary (tract) infections: Secondary | ICD-10-CM

## 2021-04-15 LAB — POCT URINALYSIS DIP (MANUAL ENTRY)
Bilirubin, UA: NEGATIVE
Glucose, UA: NEGATIVE mg/dL
Ketones, POC UA: NEGATIVE mg/dL
Nitrite, UA: NEGATIVE
Protein Ur, POC: 100 mg/dL — AB
Spec Grav, UA: 1.025 (ref 1.010–1.025)
Urobilinogen, UA: 0.2 E.U./dL
pH, UA: 6 (ref 5.0–8.0)

## 2021-04-15 LAB — POCT FASTING CBG KUC MANUAL ENTRY: POCT Glucose (KUC): 263 mg/dL — AB (ref 70–99)

## 2021-04-15 MED ORDER — ONDANSETRON HCL 4 MG PO TABS: 4.0000 mg | ORAL_TABLET | Freq: Four times a day (QID) | ORAL | 0 refills | Status: DC

## 2021-04-15 NOTE — ED Provider Notes (Addendum)
[facility] [name]    CSN: [id number] Arrival date & time: 04/15/21  1134      History   Chief Complaint Chief Complaint  Patient presents with   Dizziness   Hyperglycemia   Hypertension    HPI [name] is a 88 y.o. male.   HPI  Dizziness: Patient presents with his daughter.  Patient recently had a cystoscopy and bladder cancer removal on 03/17/2021.  He then had a urinary tract infection which was treated with Keflex and then he returned to the emergency department on 04/07/2021 for similar symptoms and dizziness.  Again a UTI was suspected and he was started on doxycycline.  At that time his WBC count was 10.8, GFR 24, creatinine 2.54. He had a CT scan, according to ER note,  showed an "obstructive 6 mm ureteral  calculus and evidence of cystitis as well as a large abdominal hernia but no evidence of obstruction." At that time he was suspected to have "septic stone." Today he is brought in by his daughter. They state that he has improved since this ER visit.  Abdominal and flank pain along with polyuria and dysuria have almost fully resolved.  He does continue to have some dizziness, elevated glucose nausea.  His bowel movements are regular and nonbloody.  No syncope, head injury, postural dizziness, worsening dizziness, chest pain, SOB, constipation, decreased urine flow, or neuro changes.  He is eating and drinking like normal. No mental status changes.   Past Medical History:  Diagnosis Date   Arthritis    osteoarthritis, s/p R TKR, and digits   CAD (coronary artery disease)    a. s/p CABG (2001)  b. s/p DES to RCA and cutting POBA to ostial PDA (2013)   c. s/p DES to SVG to OM2 (01/14/14) d. cath: 08/2015 NSTEMI w/ patent LIMA-LAD and 99% stenosis of SVG-OM w/ DES placed. CTO of SVG-RCA and SVG-D1.    Cancer [facility])    Cataract    Chronic diastolic CHF (congestive heart failure) ([place])    a) 09/13 ECHO- LVEF 40-98%, grade 1 diastolic dysfunction, mild LA dilatation,  atrial septal aneurysm, AV mobility restricted, but no sig AS by doppler; b) 09/04/08 ECHO- LVH, ef 60%, mild AS, c. echo 08/2015: EF perserved of 55-60% with inferolateral HK. Mild AS noted.   Chronic kidney disease, stage III (moderate) ([name])    Chronic lower back pain    Colon polyps    COVID-19    Diverticulosis    Dyspnea 2009 since July -Sept   05/06/08-CPST-  normal effort, reduced VO2 max 20.5 /65%, reduced at 8.2/ 40%, normal breathing resetvca of 55%, submaximal heart rate response 112/77%, flattened o2 pluse response at peak exercise-12 ml/beat @ 85%, No VQ mismatch abnormalities, All c/w CIRC Limitation   Enlarged prostate    Esophageal stricture    a. s/p dilation spring 2010   GERD (gastroesophageal reflux disease)    Heart murmur    Hiatal hernia    History of carpal tunnel syndrome    Bilateral   History of kidney stones    History of PFTs    mixed pattern on spiro. mild restn on lung volumes with near normal DLCO. Pattern can be explained by CABG scar. Fev1 2.2L/73%, ratio 68 (67), TLC 4.7/68%,RV 1.5L/55%,DLCO 79%   Hyperlipidemia    Hypertension    Interstitial lung disease ([name])    NOS   Iron deficiency anemia    Myocardial infarction ([name])    Nausea &  vomiting    2018/2019   On home oxygen therapy    2 L [place] at bedtime   Osteoporosis    Overweight (BMI 25.0-29.9)    BMI 29   Peripheral neuropathy    RA (rheumatoid arthritis) ([place])    [name]   Seropositive rheumatoid arthritis ([place])    Type II diabetes mellitus ([place])    diet controlled   Wears glasses    Wears partial dentures    upper    Patient Active Problem List   Diagnosis Date Noted   UTI (urinary tract infection) 01/28/2021   Bladder cancer ([place]) 01/28/2021   Cystitis 10/29/2020   Weakness 10/29/2020   Macular degeneration, wet ([place]) 06/18/2020   Pain due to onychomycosis of toenails of both feet 05/22/2020   Ventricular tachycardia 04/23/2020   Chronic narcotic dependence ([place])  12/14/2019   Plantar flexed metatarsal bone of left foot 11/08/2019   Long term prescription opiate use 12/14/2018   Neuropathy 12/14/2018   Stage 3b chronic kidney disease ([place]) 12/14/2018   Problems with swallowing and mastication    Achalasia    Right inguinal hernia 07/04/2018   Advance directive discussed with patient 04/28/2018   Chronic pain of both lower extremities 10/26/2017   Neuropathic pain 10/26/2017   Chronic pain syndrome 10/26/2017   Iron deficiency anemia 07/05/2017   Constipation 07/05/2017   CKD (chronic kidney disease) stage 4, GFR 15-29 ml/min ([name]) 05/17/2017   Seropositive rheumatoid arthritis ([place]) 05/17/2017   DM (diabetes mellitus) type II controlled, neurological manifestation ([place]) 05/17/2017   CAD (coronary artery disease) 05/17/2017   Dysphagia 05/17/2017   High risk medication use 10/21/2016   Primary osteoarthritis of both knees 10/21/2016   History of right knee joint replacement 10/21/2016   Orthostatic hypotension 10/01/2016   Spinal stenosis of lumbar region without neurogenic claudication 03/23/2016   Lumbar facet arthropathy 03/23/2016   Respiratory failure, chronic ([place]) 11/21/2014   Rheumatoid arthritis ([place]) 11/05/2014   Rheumatic fever without heart involvement 03/04/2014   Ventral hernia 12/17/2013   ILD (interstitial lung disease) ([place]) 11/28/2011   (HFpEF) heart failure with preserved ejection fraction ([place]) 09/14/2011   Nausea 02/16/2011   Diabetic polyneuropathy associated with type 2 diabetes mellitus ([place]) 08/10/2010   Type 2 diabetes mellitus with diabetic polyneuropathy ([place]) 08/10/2010   Obstructive sleep apnea 12/17/2008   ESOPHAGEAL STRICTURE 10/09/2008   Esophageal stricture 10/09/2008   Reflux esophagitis 09/10/2008   Diverticulosis of colon 09/10/2008   Gastro-esophageal reflux disease with esophagitis 09/10/2008   Coronary atherosclerosis 03/19/2008   Aortic stenosis 03/19/2008   Thoracic aorta atherosclerosis ([place])  03/19/2008   SLEEP DISORDER, CHRONIC 10/17/2006   Disturbance in sleep behavior 10/17/2006   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   Essential hypertension 09/23/2006   GERD 09/23/2006   BENIGN PROSTATIC HYPERTROPHY 09/23/2006   Enlarged prostate without lower urinary tract symptoms (luts) 09/23/2006   HLD (hyperlipidemia) 09/23/2006    Past Surgical History:  Procedure Laterality Date   BALLOON DILATION N/A 09/12/2018   Procedure: BALLOON DILATION;  Surgeon: Irene Shipper, MD;  [facility]: [facility];  Service: Endoscopy;  Laterality: N/A;   BALLOON DILATION N/A 02/20/2020   Procedure: BALLOON DILATION;  Surgeon: Irene Shipper, MD;  [facility];  Service: Endoscopy;  Laterality: N/A;   BOTOX INJECTION N/A 09/12/2018   Procedure: BOTOX INJECTION;  Surgeon: Irene Shipper, MD;  [facility];  Service: Endoscopy;  Laterality: N/A;   BOTOX INJECTION N/A 11/27/2018   Procedure: BOTOX  INJECTION;  Surgeon: Irene Shipper, MD;  [facility];  Service: Endoscopy;  Laterality: N/A;   BOTOX INJECTION N/A 02/20/2020   Procedure: BOTOX INJECTION;  Surgeon: Irene Shipper, MD;  [facility];  Service: Endoscopy;  Laterality: N/A;   CARDIAC CATHETERIZATION  08/2004   CP- no MI, Cath- small vessell disease    CARDIAC CATHETERIZATION  12/31/2011   80% distal LM, 100% native LAD, LCx and RCA, 30% prox SVG-OM, SVG-D1 normal, 99% distal, 80% ostial SVG-RCA distal to graft, LIMA-LAD normal; LVEF mildly decreased with posterior basal AK    CARDIAC CATHETERIZATION  2009   with patent grafts/notes 12/31/2011   CARDIAC CATHETERIZATION N/A 08/13/2015   Procedure: Left Heart Cath and Cors/Grafts Angiography;  Surgeon: Sherren Mocha, MD;  Location: [facility];  Service: Cardiovascular;  Laterality: N/A;   CATARACT EXTRACTION W/ INTRAOCULAR LENS  IMPLANT, BILATERAL Bilateral    CHOLECYSTECTOMY OPEN  11/2003   [name]   CORONARY ANGIOPLASTY WITH STENT  PLACEMENT  01/03/2012   Successful DES to SVG-RCA and cutting balloon angioplasty ostial  PDA    CORONARY ANGIOPLASTY WITH STENT PLACEMENT  01/14/2014   "1"   CORONARY ARTERY BYPASS GRAFT  11/1999   CABG X5   CORONARY STENT PLACEMENT  02/2012   1 stent and balloon   CYSTOSCOPY WITH BIOPSY N/A 03/17/2021   Procedure: DIAGNOSTIC CYSTOSCOPY WITH BIOPSY;  Surgeon: Robley Fries, MD;  [facility];  Service: Urology;  Laterality: N/A;  1 HR   ESOPHAGEAL DILATION  11/27/2018   Procedure: ESOPHAGEAL DILATION;  Surgeon: Irene Shipper, MD;  [facility];  Service: Endoscopy;;   ESOPHAGOGASTRODUODENOSCOPY N/A 03/01/2017   Procedure: ESOPHAGOGASTRODUODENOSCOPY (EGD);  Surgeon: Irene Shipper, MD;  [facility];  Service: Endoscopy;  Laterality: N/A;   ESOPHAGOGASTRODUODENOSCOPY (EGD) WITH ESOPHAGEAL DILATION  2010   ESOPHAGOGASTRODUODENOSCOPY (EGD) WITH PROPOFOL N/A 09/12/2018   Procedure: ESOPHAGOGASTRODUODENOSCOPY (EGD) WITH PROPOFOL;  Surgeon: Irene Shipper, MD;  [facility];  Service: Endoscopy;  Laterality: N/A;   ESOPHAGOGASTRODUODENOSCOPY (EGD) WITH PROPOFOL N/A 11/27/2018   Procedure: ESOPHAGOGASTRODUODENOSCOPY (EGD) WITH PROPOFOL, WITH BALLOON DILATION;  Surgeon: Irene Shipper, MD;  [facility];  Service: Endoscopy;  Laterality: N/A;   ESOPHAGOGASTRODUODENOSCOPY (EGD) WITH PROPOFOL N/A 02/20/2020   Procedure: ESOPHAGOGASTRODUODENOSCOPY (EGD) WITH PROPOFOL;  Surgeon: Irene Shipper, MD;  [facility];  Service: Endoscopy;  Laterality: N/A;   HAND SURGERY     bilateral carpal tunnel releases   JOINT REPLACEMENT     KNEE ARTHROSCOPY Right 2008   LEFT AND RIGHT HEART CATHETERIZATION WITH CORONARY ANGIOGRAM  12/31/2011   Procedure: LEFT AND RIGHT HEART CATHETERIZATION WITH CORONARY ANGIOGRAM;  Surgeon: Burnell Blanks, MD;  Location[name] CATH LAB;  Service: Cardiovascular;;   LEFT AND RIGHT HEART CATHETERIZATION WITH CORONARY ANGIOGRAM N/A  01/14/2014   Procedure: LEFT AND RIGHT HEART CATHETERIZATION WITH CORONARY ANGIOGRAM;  Surgeon: Peter M Martinique, MD;  Location: [facility] CATH LAB;  Service: Cardiovascular;  Laterality: N/A;   MALONEY DILATION  03/01/2017   Procedure: Venia Minks DILATION;  Surgeon: Irene Shipper, MD;  [facility];  Service: Endoscopy;;   PERCUTANEOUS CORONARY INTERVENTION-BALLOON ONLY  01/03/2012   Procedure: PERCUTANEOUS CORONARY INTERVENTION-BALLOON ONLY;  Surgeon: Peter M Martinique, MD;  [facility];  Service: Cardiovascular;;   PERCUTANEOUS CORONARY STENT INTERVENTION (PCI-S)  12/31/2011   Procedure: PERCUTANEOUS CORONARY STENT INTERVENTION (PCI-S);  Surgeon: Burnell Blanks, MD;  Location: [facility] CATH LAB;  Service: Cardiovascular;;  PERCUTANEOUS CORONARY STENT INTERVENTION (PCI-S) N/A 01/03/2012   Procedure: PERCUTANEOUS CORONARY STENT INTERVENTION (PCI-S);  Surgeon: Peter M Martinique, MD;  Location[facility] CATH LAB;  Service: Cardiovascular;  Laterality: N/A;   SHOULDER ARTHROSCOPY WITH OPEN ROTATOR CUFF REPAIR AND DISTAL CLAVICLE ACROMINECTOMY Left 02/27/2013   Procedure: LEFT SHOULDER ARTHROSCOPY WITH MINI OPEN ROTATOR CUFF REPAIR AND SUBACROMIAL DECOMPRESSION AND DISTAL CLAVICLE RESECTION;  Surgeon: [name], MD;  Location: [facility];  Service: Orthopedics;  Laterality: Left;   TOTAL KNEE ARTHROPLASTY Right 03/2010   [name]   TRANSURETHRAL RESECTION OF BLADDER TUMOR N/A 03/17/2021   Procedure: TRANSURETHRAL RESECTION OF BLADDER TUMOR (TURBT);  Surgeon: Robley Fries, MD;  [facility];  Service: Urology;  Laterality: N/A;   TRANSURETHRAL RESECTION OF BLADDER TUMOR WITH MITOMYCIN-C N/A 10/08/2020   Procedure: TRANSURETHRAL RESECTION OF BLADDER TUMOR WITH GEMCITABINE;  Surgeon: Robley Fries, MD;  [facility];  Service: Urology;  Laterality: N/A;  75 MINS   TRIGGER FINGER RELEASE Left 02/27/2013   Procedure: RELEASE TRIGGER FINGER/A-1 PULLEY;  Surgeon: [name], MD;   Location: [facility];  Service: Orthopedics;  Laterality: Left;       Home Medications    Prior to Admission medications   Medication Sig Start Date End Date Taking? Authorizing Provider  amLODipine (NORVASC) 5 MG tablet Take 5 mg by mouth at bedtime. 08/06/19   [provider]  aspirin EC 81 MG tablet Take 81 mg by mouth at bedtime.    [provider]  Blood Glucose Monitoring Suppl (ONE TOUCH ULTRA 2) w/Device KIT Use to obtain blood sugar daily. Dx Code E11.40 04/12/19   Viviana Simpler I, MD  carvedilol (COREG) 6.25 MG tablet Take 6.25 mg by mouth 2 (two) times daily. 11/15/19   [provider]  clopidogrel (PLAVIX) 75 MG tablet Take 1 tablet (75 mg total) by mouth daily. 03/06/21   [name], MD  doxycycline (VIBRAMYCIN) 100 MG capsule Take 1 capsule (100 mg total) by mouth 2 (two) times daily. 04/07/21   Kommor, Debe Coder, MD  furosemide (LASIX) 40 MG tablet Take 40 mg by mouth every Monday, Wednesday, and Friday.    [provider]  glucose blood (ONE TOUCH ULTRA TEST) test strip USE TO CHECK BLOOD SUGAR ONCE A DAY Dx Code E11.40 01/29/20   [name], MD  HYDROcodone-acetaminophen (NORCO) 10-325 MG tablet Take 1 tablet by mouth every 6 (six) hours as needed for severe pain. Must last 30 days. 04/10/21 05/10/21  Gillis Santa, MD  HYDROcodone-acetaminophen (NORCO) 10-325 MG tablet Take 1 tablet by mouth every 6 (six) hours as needed for severe pain. Must last 30 days. 05/10/21 06/09/21  Gillis Santa, MD  Multiple Vitamins-Minerals (ICAPS AREDS 2 PO) Take 1 capsule by mouth daily.    [provider]  nitroGLYCERIN (NITROSTAT) 0.4 MG SL tablet DISSOLVE 1 TABLET UNDER TONGUE AS NEEDEDFOR CHEST PAIN. MAY REPEAT 5 MINUTES APART 3 TIMES IF NEEDED. IF NO RELIEF CALL 911 01/19/21   [name], MD  OneTouch Delica Lancets 54T MISC 1 each by In Vitro route daily. Dx Code E11.49 01/29/20   Viviana Simpler I, MD  OXYGEN Inhale 2 L into the lungs at  bedtime. Per family Uses Oxygen 2 liters sometimes during the day    [provider]  pantoprazole (PROTONIX) 40 MG tablet TAKE 1 TABLET BY MOUTH TWICE (2) DAILY 07/23/20   [name], MD  polyethylene glycol (MIRALAX / GLYCOLAX) 17 g  packet Take 17 g by mouth every other day.    [provider]  pregabalin (LYRICA) 25 MG capsule Take 1 capsule (25 mg total) by mouth 2 (two) times daily. 03/05/21   Gillis Santa, MD  pregabalin (LYRICA) 50 MG capsule Take 50 mg by mouth at bedtime.    [provider]  tamsulosin (FLOMAX) 0.4 MG CAPS capsule Take 0.4 mg by mouth at bedtime. 01/31/19   [provider]    Family History Family History  Problem Relation Age of Onset   COPD Mother    Heart disease Father    Heart attack Father    Stomach cancer Brother    Stroke Sister    Alcohol abuse Sister    Colon cancer Brother 41   Diabetes Brother    Rectal cancer Neg Hx     Social History Social History   Tobacco Use   Smoking status: Former    Packs/day: 1.00    Years: 20.00    Pack years: 20.00    Types: Cigarettes    Quit date: 04/06/1963    Years since quitting: 58.0   Smokeless tobacco: Former  Scientific laboratory technician Use: Never used  Substance Use Topics   Alcohol use: No    Alcohol/week: 0.0 standard drinks    Comment: 01/01/2012 "last alcohol ~ 65 yr ago"   Drug use: No     Allergies   Metronidazole, Doxazosin mesylate, and Methocarbamol   Review of Systems Review of Systems  As stated above in HPI Physical Exam Triage Vital Signs ED Triage Vitals  Enc Vitals Group     BP 04/15/21 1158 118/69     Pulse Rate 04/15/21 1158 70     Resp 04/15/21 1158 18     Temp 04/15/21 1158 98.2 F (36.8 C)     Temp Source 04/15/21 1158 Oral     SpO2 04/15/21 1158 95 %     Weight --      Height --      Head Circumference --      Peak Flow --      Pain Score 04/15/21 1139 0     Pain Loc --      Pain Edu? --      Excl. in [place]? --    No  data found.  Updated Vital Signs BP 118/69 (BP Location: Right Arm)    Pulse 70    Temp 98.2 F (36.8 C) (Oral)    Resp 18    SpO2 95%   Physical Exam Vitals and nursing note reviewed.  Constitutional:      General: He is not in acute distress.    Appearance: Normal appearance. He is not ill-appearing, toxic-appearing or diaphoretic.  HENT:     Head: Normocephalic and atraumatic.     Nose: Nose normal.     Mouth/Throat:     Mouth: Mucous membranes are moist.  Eyes:     Extraocular Movements: Extraocular movements intact.     Pupils: Pupils are equal, round, and reactive to light.     Comments: No pallor  Neck:     Vascular: No carotid bruit.  Cardiovascular:     Rate and Rhythm: Normal rate and regular rhythm.     Heart sounds: Normal heart sounds.  Pulmonary:     Effort: Pulmonary effort is normal.     Breath sounds: Normal breath sounds.  Abdominal:     General: Bowel sounds are normal. There is no  distension.     Palpations: Abdomen is soft. There is no mass.     Tenderness: There is no abdominal tenderness. There is no right CVA tenderness, left CVA tenderness, guarding or rebound.  Musculoskeletal:     Cervical back: Neck supple.  Lymphadenopathy:     Cervical: No cervical adenopathy.  Skin:    General: Skin is warm.     Coloration: Skin is not pale.  Neurological:     General: No focal deficit present.     Mental Status: He is alert and oriented to person, place, and time.     Cranial Nerves: No cranial nerve deficit.     Sensory: No sensory deficit.     Motor: No weakness.     Coordination: Coordination normal.     Gait: Gait normal.     Deep Tendon Reflexes: Reflexes normal.  Psychiatric:        Mood and Affect: Mood normal.        Behavior: Behavior normal.        Thought Content: Thought content normal.        Judgment: Judgment normal.     UC Treatments / Results  Labs (all labs ordered are listed, but only abnormal results are displayed) Labs  Reviewed - No data to display  EKG   Radiology No results found.  Procedures Procedures (including critical care time)  Medications Ordered in UC Medications - No data to display  Initial Impression / Assessment and Plan / UC Course  I have reviewed the triage vital signs and the nursing notes.  Pertinent labs & imaging results that were available during my care of the patient were reviewed by me and considered in my medical decision making (see chart for details).     New. Patient is medically complex.  Since he is improving clinically and his UA suggests the same we will culture and have him keep his follow up visits with his surgeon and nephrologist tomorrow. He will continue his doxycyline ABX. He will try to lower his sugar intake which they feel is very possible as he is not on a low sugar diet. Zofran for his nausea which may be from his ABX. Should he have any worsening of symptoms or red flag signs and symptoms he will go to the ER.  Final Clinical Impressions(s) / UC Diagnoses   Final diagnoses:  None   Discharge Instructions   None    ED Prescriptions   None    PDMP not reviewed this encounter.   Hughie Closs, PA-C 04/15/21 1252    Hughie Closs, PA-C 04/15/21 1254    Hughie Closs, PA-C 04/15/21 1303

## 2021-04-15 NOTE — Telephone Encounter (Signed)
Spoke to pt about seeing Dr Silvio Pate. We have offered a 130 pm appt tomorrow (1-12) at 130. Check in at 115. He said he would call back if his daughter can bring him.

## 2021-04-15 NOTE — Telephone Encounter (Signed)
Tried to call pt to see how he was doing after ER Visit and to see if he has set up an appointment with urology. I did not see one set up.

## 2021-04-15 NOTE — ED Triage Notes (Signed)
Pt states he is here for dizziness and general unwell feeling. Daughter confirms this and states he has said his BP is high and CBG is also high. Pt states he is on abx for his kidney infection.

## 2021-04-15 NOTE — Telephone Encounter (Signed)
Spoke to pt. He said he has the OV at the [facility] today. He will call us if he needs to see Dr Silvio Pate.

## 2021-04-16 ENCOUNTER — Other Ambulatory Visit: Payer: Self-pay

## 2021-04-16 ENCOUNTER — Ambulatory Visit (INDEPENDENT_AMBULATORY_CARE_PROVIDER_SITE_OTHER): Payer: Medicare Other | Admitting: Internal Medicine

## 2021-04-16 ENCOUNTER — Encounter: Payer: Self-pay | Admitting: Internal Medicine

## 2021-04-16 DIAGNOSIS — R42 Dizziness and giddiness: Secondary | ICD-10-CM | POA: Diagnosis not present

## 2021-04-16 LAB — URINE CULTURE: Culture: NO GROWTH

## 2021-04-16 NOTE — Progress Notes (Signed)
Subjective:    Patient ID: [name], male    DOB: 1932/03/22, 88 y.o.   MRN: [id number]  HPI Here with wife and daughter due to dizziness  Had bladder tumor removed last month--Dr Claudia Desanctis Had BCG treatments---done now Had repeat cystoscopy since then--not doing well since  Had UTI diagnosed 1/3 in ER---went due to feeling bad Also had kidney stone found Started on antibiotic--now on doxy  Having dizziness---"like you almost pass out" Seen in urgent care yesterday----urine sent out again [place] and hit head last night Sugars running high yesterday---292 at urgent care (under 200 at home)  Feels wobbly Dizzy when standing Doesn't monitor weight ---but has lost a lot No chest pain Same SOB  Current Outpatient Medications on File Prior to Visit  Medication Sig Dispense Refill   amLODipine (NORVASC) 5 MG tablet Take 5 mg by mouth at bedtime.     aspirin EC 81 MG tablet Take 81 mg by mouth at bedtime.     Blood Glucose Monitoring Suppl (ONE TOUCH ULTRA 2) w/Device KIT Use to obtain blood sugar daily. Dx Code E11.40 1 kit 0   carvedilol (COREG) 6.25 MG tablet Take 6.25 mg by mouth 2 (two) times daily.     clopidogrel (PLAVIX) 75 MG tablet Take 1 tablet (75 mg total) by mouth daily. 90 tablet 3   doxycycline (VIBRAMYCIN) 100 MG capsule Take 1 capsule (100 mg total) by mouth 2 (two) times daily. 20 capsule 0   furosemide (LASIX) 40 MG tablet Take 40 mg by mouth every Monday, Wednesday, and Friday.     glucose blood (ONE TOUCH ULTRA TEST) test strip USE TO CHECK BLOOD SUGAR ONCE A DAY Dx Code E11.40 100 each 3   HYDROcodone-acetaminophen (NORCO) 10-325 MG tablet Take 1 tablet by mouth every 6 (six) hours as needed for severe pain. Must last 30 days. 120 tablet 0   [START ON 05/10/2021] HYDROcodone-acetaminophen (NORCO) 10-325 MG tablet Take 1 tablet by mouth every 6 (six) hours as needed for severe pain. Must last 30 days. 120 tablet 0   Multiple Vitamins-Minerals (ICAPS AREDS 2 PO)  Take 1 capsule by mouth daily.     nitroGLYCERIN (NITROSTAT) 0.4 MG SL tablet DISSOLVE 1 TABLET UNDER TONGUE AS NEEDEDFOR CHEST PAIN. MAY REPEAT 5 MINUTES APART 3 TIMES IF NEEDED. IF NO RELIEF CALL 911 25 tablet 0   ondansetron (ZOFRAN) 4 MG tablet Take 1 tablet (4 mg total) by mouth every 6 (six) hours. 12 tablet 0   OneTouch Delica Lancets 26O MISC 1 each by In Vitro route daily. Dx Code E11.49 100 each 3   OXYGEN Inhale 2 L into the lungs at bedtime. Per family Uses Oxygen 2 liters sometimes during the day     pantoprazole (PROTONIX) 40 MG tablet TAKE 1 TABLET BY MOUTH TWICE (2) DAILY 180 tablet 3   polyethylene glycol (MIRALAX / GLYCOLAX) 17 g packet Take 17 g by mouth every other day.     pregabalin (LYRICA) 25 MG capsule Take 1 capsule (25 mg total) by mouth 2 (two) times daily. 60 capsule 2   pregabalin (LYRICA) 50 MG capsule Take 50 mg by mouth at bedtime.     tamsulosin (FLOMAX) 0.4 MG CAPS capsule Take 0.4 mg by mouth at bedtime.     No current facility-administered medications on file prior to visit.    Allergies  Allergen Reactions   Metronidazole     Kidney Dr does not want pt to take  Doxazosin Mesylate Other (See Comments)    dizziness   Methocarbamol Rash    Past Medical History:  Diagnosis Date   Arthritis    osteoarthritis, s/p R TKR, and digits   CAD (coronary artery disease)    a. s/p CABG (2001)  b. s/p DES to RCA and cutting POBA to ostial PDA (2013)   c. s/p DES to SVG to OM2 (01/14/14) d. cath: 08/2015 NSTEMI w/ patent LIMA-LAD and 99% stenosis of SVG-OM w/ DES placed. CTO of SVG-RCA and SVG-D1.    Cancer [facility])    Cataract    Chronic diastolic CHF (congestive heart failure) ([place])    a) 09/13 ECHO- LVEF 72-53%, grade 1 diastolic dysfunction, mild LA dilatation, atrial septal aneurysm, AV mobility restricted, but no sig AS by doppler; b) 09/04/08 ECHO- LVH, ef 60%, mild AS, c. echo 08/2015: EF perserved of 55-60% with inferolateral HK. Mild AS noted.   Chronic  kidney disease, stage III (moderate) ([name])    Chronic lower back pain    Colon polyps    COVID-19    Diverticulosis    Dyspnea 2009 since July -Sept   05/06/08-[facility]-  normal effort, reduced VO2 max 20.5 /65%, reduced at 8.2/ 40%, normal breathing resetvca of 55%, submaximal heart rate response 112/77%, flattened o2 pluse response at peak exercise-12 ml/beat @ 85%, No VQ mismatch abnormalities, All c/w CIRC Limitation   Enlarged prostate    Esophageal stricture    a. s/p dilation spring 2010   GERD (gastroesophageal reflux disease)    Heart murmur    Hiatal hernia    History of carpal tunnel syndrome    Bilateral   History of kidney stones    History of PFTs    mixed pattern on spiro. mild restn on lung volumes with near normal DLCO. Pattern can be explained by CABG scar. Fev1 2.2L/73%, ratio 68 (67), TLC 4.7/68%,RV 1.5L/55%,DLCO 79%   Hyperlipidemia    Hypertension    Interstitial lung disease ([name])    NOS   Iron deficiency anemia    Myocardial infarction ([name])    Nausea & vomiting    2018/2019   On home oxygen therapy    2 L [place] at bedtime   Osteoporosis    Overweight (BMI 25.0-29.9)    BMI 29   Peripheral neuropathy    RA (rheumatoid arthritis) ([place])    [name]   Seropositive rheumatoid arthritis ([place])    Type II diabetes mellitus ([place])    diet controlled   Wears glasses    Wears partial dentures    upper    Past Surgical History:  Procedure Laterality Date   BALLOON DILATION N/A 09/12/2018   Procedure: BALLOON DILATION;  Surgeon: Irene Shipper, MD;  Location: [facility];  Service: Endoscopy;  Laterality: N/A;   BALLOON DILATION N/A 02/20/2020   Procedure: BALLOON DILATION;  Surgeon: Irene Shipper, MD;  [facility];  Service: Endoscopy;  Laterality: N/A;   BOTOX INJECTION N/A 09/12/2018   Procedure: BOTOX INJECTION;  Surgeon: Irene Shipper, MD;  [facility];  Service: Endoscopy;  Laterality: N/A;   BOTOX INJECTION N/A 11/27/2018   Procedure:  BOTOX INJECTION;  Surgeon: Irene Shipper, MD;  [facility];  Service: Endoscopy;  Laterality: N/A;   BOTOX INJECTION N/A 02/20/2020   Procedure: BOTOX INJECTION;  Surgeon: Irene Shipper, MD;  [facility];  Service: Endoscopy;  Laterality: N/A;   CARDIAC CATHETERIZATION  08/2004   CP- no MI,  Cath- small vessell disease    CARDIAC CATHETERIZATION  12/31/2011   80% distal LM, 100% native LAD, LCx and RCA, 30% prox SVG-OM, SVG-D1 normal, 99% distal, 80% ostial SVG-RCA distal to graft, LIMA-LAD normal; LVEF mildly decreased with posterior basal AK    CARDIAC CATHETERIZATION  2009   with patent grafts/notes 12/31/2011   CARDIAC CATHETERIZATION N/A 08/13/2015   Procedure: Left Heart Cath and Cors/Grafts Angiography;  Surgeon: Sherren Mocha, MD;  Location: [place] [facility];  Service: Cardiovascular;  Laterality: N/A;   CATARACT EXTRACTION W/ INTRAOCULAR LENS  IMPLANT, BILATERAL Bilateral    CHOLECYSTECTOMY OPEN  11/2003   [name]   CORONARY ANGIOPLASTY WITH STENT PLACEMENT  01/03/2012   Successful DES to SVG-RCA and cutting balloon angioplasty ostial  PDA    CORONARY ANGIOPLASTY WITH STENT PLACEMENT  01/14/2014   "1"   CORONARY ARTERY BYPASS GRAFT  11/1999   CABG X5   CORONARY STENT PLACEMENT  02/2012   1 stent and balloon   CYSTOSCOPY WITH BIOPSY N/A 03/17/2021   Procedure: DIAGNOSTIC CYSTOSCOPY WITH BIOPSY;  Surgeon: Robley Fries, MD;  [facility];  Service: Urology;  Laterality: N/A;  1 HR   ESOPHAGEAL DILATION  11/27/2018   Procedure: ESOPHAGEAL DILATION;  Surgeon: Irene Shipper, MD;  [facility];  Service: Endoscopy;;   ESOPHAGOGASTRODUODENOSCOPY N/A 03/01/2017   Procedure: ESOPHAGOGASTRODUODENOSCOPY (EGD);  Surgeon: Irene Shipper, MD;  Location: [facility];  Service: Endoscopy;  Laterality: N/A;   ESOPHAGOGASTRODUODENOSCOPY (EGD) WITH ESOPHAGEAL DILATION  2010   ESOPHAGOGASTRODUODENOSCOPY (EGD) WITH PROPOFOL N/A 09/12/2018   Procedure:  ESOPHAGOGASTRODUODENOSCOPY (EGD) WITH PROPOFOL;  Surgeon: Irene Shipper, MD;  [facility];  Service: Endoscopy;  Laterality: N/A;   ESOPHAGOGASTRODUODENOSCOPY (EGD) WITH PROPOFOL N/A 11/27/2018   Procedure: ESOPHAGOGASTRODUODENOSCOPY (EGD) WITH PROPOFOL, WITH BALLOON DILATION;  Surgeon: Irene Shipper, MD;  [facility];  Service: Endoscopy;  Laterality: N/A;   ESOPHAGOGASTRODUODENOSCOPY (EGD) WITH PROPOFOL N/A 02/20/2020   Procedure: ESOPHAGOGASTRODUODENOSCOPY (EGD) WITH PROPOFOL;  Surgeon: Irene Shipper, MD;  [facility];  Service: Endoscopy;  Laterality: N/A;   HAND SURGERY     bilateral carpal tunnel releases   JOINT REPLACEMENT     KNEE ARTHROSCOPY Right 2008   LEFT AND RIGHT HEART CATHETERIZATION WITH CORONARY ANGIOGRAM  12/31/2011   Procedure: LEFT AND RIGHT HEART CATHETERIZATION WITH CORONARY ANGIOGRAM;  Surgeon: Burnell Blanks, MD;  Location: [facility] C[facility] LAB;  Service: Cardiovascular;;   LEFT AND RIGHT HEART CATHETERIZATION WITH CORONARY ANGIOGRAM N/A 01/14/2014   Procedure: LEFT AND RIGHT HEART CATHETERIZATION WITH CORONARY ANGIOGRAM;  Surgeon: Peter M Martinique, MD;  Location: [facility] CATH LAB;  Service: Cardiovascular;  Laterality: N/A;   MALONEY DILATION  03/01/2017   Procedure: Venia Minks DILATION;  Surgeon: Irene Shipper, MD;  [facility];  Service: Endoscopy;;   PERCUTANEOUS CORONARY INTERVENTION-BALLOON ONLY  01/03/2012   Procedure: PERCUTANEOUS CORONARY INTERVENTION-BALLOON ONLY;  Surgeon: Peter M Martinique, MD;  Location: [facility] CATH LAB;  Service: Cardiovascular;;   PERCUTANEOUS CORONARY STENT INTERVENTION (PCI-S)  12/31/2011   Procedure: PERCUTANEOUS CORONARY STENT INTERVENTION (PCI-S);  Surgeon: Burnell Blanks, MD;  Location: [facility] CATH LAB;  Service: Cardiovascular;;   PERCUTANEOUS CORONARY STENT INTERVENTION (PCI-S) N/A 01/03/2012   Procedure: PERCUTANEOUS CORONARY STENT INTERVENTION (PCI-S);  Surgeon: Peter M Martinique, MD;  Location: [facility] CATH LAB;   Service: Cardiovascular;  Laterality: N/A;   SHOULDER ARTHROSCOPY WITH OPEN ROTATOR CUFF REPAIR AND DISTAL CLAVICLE ACROMINECTOMY Left 02/27/2013   Procedure: LEFT SHOULDER ARTHROSCOPY  WITH MINI OPEN ROTATOR CUFF REPAIR AND SUBACROMIAL DECOMPRESSION AND DISTAL CLAVICLE RESECTION;  Surgeon: [name], MD;  Location: [place];  Service: Orthopedics;  Laterality: Left;   TOTAL KNEE ARTHROPLASTY Right 03/2010   [name]   TRANSURETHRAL RESECTION OF BLADDER TUMOR N/A 03/17/2021   Procedure: TRANSURETHRAL RESECTION OF BLADDER TUMOR (TURBT);  Surgeon: Robley Fries, MD;  [facility];  Service: Urology;  Laterality: N/A;   TRANSURETHRAL RESECTION OF BLADDER TUMOR WITH MITOMYCIN-C N/A 10/08/2020   Procedure: TRANSURETHRAL RESECTION OF BLADDER TUMOR WITH GEMCITABINE;  Surgeon: Robley Fries, MD;  Location[facility];  Service: Urology;  Laterality: N/A;  75 MINS   TRIGGER FINGER RELEASE Left 02/27/2013   Procedure: RELEASE TRIGGER FINGER/A-1 PULLEY;  Surgeon: [name], MD;  Location: [place];  Service: Orthopedics;  Laterality: Left;    Family History  Problem Relation Age of Onset   COPD Mother    Heart disease Father    Heart attack Father    Stomach cancer Brother    Stroke Sister    Alcohol abuse Sister    Colon cancer Brother 55   Diabetes Brother    Rectal cancer Neg Hx     Social History   Socioeconomic History   Marital status: Married    Spouse name: Not on file   Number of children: 3   Years of education: Not on file   Highest education level: Not on file  Occupational History   Occupation: Designer, jewellery: RETIRED    Comment: retired  Tobacco Use   Smoking status: Former    Packs/day: 1.00    Years: 20.00    Pack years: 20.00    Types: Cigarettes    Quit date: 04/06/1963    Years since quitting: 58.0   Smokeless tobacco: Former  Scientific laboratory technician Use: Never used  Substance and Sexual Activity   Alcohol use: No    Alcohol/week: 0.0  standard drinks    Comment: 01/01/2012 "last alcohol ~ 50 yr ago"   Drug use: No   Sexual activity: Not Currently  Other Topics Concern   Not on file  Social History Narrative   No living will   Requests wife as health care POA-- alternate is daughter Hassan Rowan   Discussed DNR --he requests this (done 08/29/12)   Not sure about feeding tube---but might accept for some time   Patient lives with wife and daughter in a one story home.  Has 3 children.  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   Social Determinants of Health   Financial Resource Strain: Low Risk    Difficulty of Paying Living Expenses: Not very hard  Food Insecurity: Not on file  Transportation Needs: Not on file  Physical Activity: Not on file  Stress: Not on file  Social Connections: Not on file  Intimate Partner Violence: Not on file   Review of Systems Eating so-so Not sleeping well    Objective:   Physical Exam Constitutional:      Appearance: Normal appearance.  Cardiovascular:     Rate and Rhythm: Regular rhythm. Bradycardia present.     Heart sounds: No murmur heard.   No gallop.     Comments: Rate in upper 50's Pulmonary:     Effort: Pulmonary effort is normal.     Breath sounds: No wheezing.     Comments: Decreased breath sounds No crackles now Abdominal:     Palpations:  Abdomen is soft.     Tenderness: There is no abdominal tenderness.  Musculoskeletal:     Cervical back: Neck supple.  Lymphadenopathy:     Cervical: No cervical adenopathy.  Neurological:     Mental Status: He is alert.           Assessment & Plan:

## 2021-04-16 NOTE — Patient Instructions (Signed)
Please stop the carvedilol.  Stop the furosemide. Monitor your weight every day---if you go up a fair bit (like 7-10#, you will probably need to restart)

## 2021-04-16 NOTE — Assessment & Plan Note (Addendum)
Since UTI and recent illness Heart rate below 60, has lost weight No fluid overload  Will stop the furosemide but check weight daily to decide if prn needed Will stop carvedilol given his rate and BP  Recheck ~3 weeks (sooner if not feeling better)

## 2021-04-17 DIAGNOSIS — R0689 Other abnormalities of breathing: Secondary | ICD-10-CM | POA: Diagnosis not present

## 2021-04-17 DIAGNOSIS — J8410 Pulmonary fibrosis, unspecified: Secondary | ICD-10-CM | POA: Diagnosis not present

## 2021-04-20 ENCOUNTER — Telehealth: Payer: Self-pay | Admitting: Internal Medicine

## 2021-04-20 NOTE — Telephone Encounter (Signed)
Spoke to daughter, [name]. Advised it was furosemide and carvedilol. He is off of losartan, too. Says she thinks he may have passed the kidney stone over the weekend. Says he is still feeling weak, some blurred vision, and has fallen a few more times. Asking if he needs to have his urine rechecked because of the symptoms. He finishes the Doxy today.

## 2021-04-20 NOTE — Telephone Encounter (Signed)
Spoke to daughter, [name]. She will check his BP and do as needed. Will let us know if the BP goes up.

## 2021-04-20 NOTE — Telephone Encounter (Signed)
Pt daughter called in requesting a call back regarding pt medication concerns . Stated she need to confirm what medication pt needs to stop taking . Please advise #[phone]

## 2021-04-22 DIAGNOSIS — R311 Benign essential microscopic hematuria: Secondary | ICD-10-CM | POA: Diagnosis not present

## 2021-04-22 DIAGNOSIS — N201 Calculus of ureter: Secondary | ICD-10-CM | POA: Diagnosis not present

## 2021-04-23 DIAGNOSIS — N201 Calculus of ureter: Secondary | ICD-10-CM | POA: Diagnosis not present

## 2021-04-28 ENCOUNTER — Telehealth: Payer: Self-pay | Admitting: Cardiovascular Disease

## 2021-04-28 NOTE — Telephone Encounter (Signed)
° °  Pre-operative Risk Assessment    Patient Name: [name]  DOB: 25-Sep-1931 MRN: [id number]      Request for Surgical Clearance    Procedure:   shock wave lithotripsy  Date of Surgery:  Clearance TBD                                 Surgeon:  Dr. Kateri Plummer  Surgeon's Group or Practice Name:  [facility]  Phone number:  [phone] Fax number:  [phone]   Type of Clearance Requested:   - Medical  - Pharmacy:  Hold Clopidogrel (Plavix) 5 days prior to procedure    Type of Anesthesia:  Local    Additional requests/questions:    [name]   04/28/2021, 12:20 PM

## 2021-04-28 NOTE — Telephone Encounter (Signed)
Dr. Johnsie Cancel Pt has a history of CABG with subsequent PCI's most recently in 2017. He remains on DAPT. We are asked to hold plavix for lithotripsy - OK to hold?  Please respond to preop pool.  Thanks

## 2021-04-29 NOTE — Telephone Encounter (Signed)
Rang straight to VM, left msg

## 2021-05-05 NOTE — Telephone Encounter (Signed)
Attempted to contact as part of preoperative protocol.  Left message to call back.  05/05/2021 at 7:39 AM

## 2021-05-06 ENCOUNTER — Other Ambulatory Visit: Payer: Self-pay | Admitting: Urology

## 2021-05-06 ENCOUNTER — Telehealth: Payer: Self-pay | Admitting: Cardiovascular Disease

## 2021-05-06 NOTE — Telephone Encounter (Signed)
Left a message for the patient to call back and speak to the preop APP of the day

## 2021-05-06 NOTE — Telephone Encounter (Signed)
Looks like pre op provider has reached to the pt. I will forward this call to pre op pool.

## 2021-05-06 NOTE — Telephone Encounter (Signed)
I attempted to call the daughter's number, she did not pick up. I left her a message. If family is calling back again, please use CHAT Message to contact the on call preop APP of the day so the call get transferred to [place] directly

## 2021-05-06 NOTE — Telephone Encounter (Signed)
Pts method of contact is his daughter Francee Gentile.. she can be reached @ [phone]

## 2021-05-06 NOTE — Telephone Encounter (Signed)
° °  Name: [name]  DOB: Sep 17, 1931  MRN: [id number]   Primary Cardiologist: [name], MD  Chart reviewed as part of pre-operative protocol coverage. Patient was contacted 05/06/2021 in reference to pre-operative risk assessment for pending surgery as outlined below.  [name] was last seen on 03/04/2021 by Richardson Dopp PA-C.  Since that day, [name] has done well without or worsening dyspnea.  Therefore, based on ACC/AHA guidelines, the patient would be at acceptable risk for the planned procedure without further cardiovascular testing.   Patient may hold aspirin and Plavix for 5 days prior to the procedure and restart as once possible afterward at the surgeon's discretion.  The patient was advised that if he develops new symptoms prior to surgery to contact our office to arrange for a follow-up visit, and he verbalized understanding.  I will route this recommendation to the requesting party via Epic fax function and remove from pre-op pool. Please call with questions.  [place], [place] 05/06/2021, 3:11 PM

## 2021-05-06 NOTE — Telephone Encounter (Signed)
Patient's daughter is returning call. Call transferred to [place], [place].

## 2021-05-07 ENCOUNTER — Ambulatory Visit: Payer: Medicare Other | Admitting: Podiatry

## 2021-05-07 ENCOUNTER — Ambulatory Visit: Payer: Medicare Other | Admitting: Internal Medicine

## 2021-05-07 ENCOUNTER — Encounter: Payer: Self-pay | Admitting: Podiatry

## 2021-05-07 ENCOUNTER — Other Ambulatory Visit: Payer: Self-pay

## 2021-05-07 DIAGNOSIS — E1142 Type 2 diabetes mellitus with diabetic polyneuropathy: Secondary | ICD-10-CM

## 2021-05-07 DIAGNOSIS — N184 Chronic kidney disease, stage 4 (severe): Secondary | ICD-10-CM

## 2021-05-07 DIAGNOSIS — M216X2 Other acquired deformities of left foot: Secondary | ICD-10-CM | POA: Diagnosis not present

## 2021-05-07 DIAGNOSIS — L84 Corns and callosities: Secondary | ICD-10-CM

## 2021-05-07 DIAGNOSIS — B351 Tinea unguium: Secondary | ICD-10-CM | POA: Diagnosis not present

## 2021-05-07 DIAGNOSIS — M79674 Pain in right toe(s): Secondary | ICD-10-CM

## 2021-05-07 DIAGNOSIS — M79675 Pain in left toe(s): Secondary | ICD-10-CM | POA: Diagnosis not present

## 2021-05-07 NOTE — Progress Notes (Signed)
This patient returns to my office for at risk foot care.  This patient requires this care by a professional since this patient will be at risk due to having neuropathy, CKD and diabetes.  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     This patient has a painful callus on the outside ball of his left foot.  No self treatment or professional care was noted. Patient also requests nail care today. This patient presents for at risk foot care today.  General Appearance  Alert, conversant and in no acute stress.  Vascular  Dorsalis pedis and posterior tibial  pulses are weakly  palpable  bilaterally.  Capillary return is within normal limits  bilaterally. Cold feet bilaterally. Absent digital hair  B/L.  Neurologic  Senn-Weinstein monofilament wire test absent  bilaterally. Muscle power within normal limits bilaterally.  Nails Thick disfigured discolored nails with subungual debris  from hallux to fifth toes bilaterally. No evidence of bacterial infection or drainage bilaterally.  Orthopedic  No limitations of motion  feet .  No crepitus or effusions noted.  No bony pathology or digital deformities noted. Plantar flexed fifth metatarsal left foot.  Skin  normotropic skin with no porokeratosis noted bilaterally.  No signs of infections or ulcers noted.   Callus sub 5th metatarsal left foot.  Callus due to plantarflexed fifth metatarsal left foot.  Onychomycosis  Consent was obtained for treatment procedures.   Debridement of callus with # 15 blade.  Debride nails with nail nipper followed by dremel usage.   Return office visit    12 weeks                Told patient to return for periodic foot care and evaluation due to potential at risk complications.   Gardiner Barefoot DPM

## 2021-05-08 ENCOUNTER — Ambulatory Visit (INDEPENDENT_AMBULATORY_CARE_PROVIDER_SITE_OTHER): Payer: Medicare Other | Admitting: Internal Medicine

## 2021-05-08 ENCOUNTER — Encounter: Payer: Self-pay | Admitting: Internal Medicine

## 2021-05-08 DIAGNOSIS — I5032 Chronic diastolic (congestive) heart failure: Secondary | ICD-10-CM | POA: Diagnosis not present

## 2021-05-08 DIAGNOSIS — R300 Dysuria: Secondary | ICD-10-CM | POA: Diagnosis not present

## 2021-05-08 DIAGNOSIS — R42 Dizziness and giddiness: Secondary | ICD-10-CM

## 2021-05-08 MED ORDER — CEFUROXIME AXETIL 250 MG PO TABS: 250.0000 mg | ORAL_TABLET | Freq: Two times a day (BID) | ORAL | 0 refills | Status: DC

## 2021-05-08 NOTE — Assessment & Plan Note (Signed)
Better now off furosemide and bisoprolol Weight up 8# but no fluid overload now Stay off those meds

## 2021-05-08 NOTE — Assessment & Plan Note (Signed)
Not clear that this is infectious Recent culture negative Will go ahead with 3 days of antibiotics just in case--since having lithotripsy in 3 days

## 2021-05-08 NOTE — Assessment & Plan Note (Signed)
Compensated now Discussed using the furosemide only if home weight over 190#

## 2021-05-08 NOTE — Progress Notes (Signed)
Subjective:    Patient ID: [name], male    DOB: 03-15-1932, 88 y.o.   MRN: [id number]  HPI Here for follow up of dizziness With daughter  Dizziness is gone No falls Has gained some weight back  Goes to bathroom every 20-30 minutes Thinks it is since the BCG Some burning dysuria Small amounts  No chest pain No change in chronic SOB  Current Outpatient Medications on File Prior to Visit  Medication Sig Dispense Refill   amLODipine (NORVASC) 5 MG tablet Take 5 mg by mouth at bedtime.     aspirin EC 81 MG tablet Take 81 mg by mouth at bedtime.     Blood Glucose Monitoring Suppl (ONE TOUCH ULTRA 2) w/Device KIT Use to obtain blood sugar daily. Dx Code E11.40 1 kit 0   clopidogrel (PLAVIX) 75 MG tablet Take 1 tablet (75 mg total) by mouth daily. 90 tablet 3   glucose blood (ONE TOUCH ULTRA TEST) test strip USE TO CHECK BLOOD SUGAR ONCE A DAY Dx Code E11.40 100 each 3   HYDROcodone-acetaminophen (NORCO) 10-325 MG tablet Take 1 tablet by mouth every 6 (six) hours as needed for severe pain. Must last 30 days. 120 tablet 0   [START ON 05/10/2021] HYDROcodone-acetaminophen (NORCO) 10-325 MG tablet Take 1 tablet by mouth every 6 (six) hours as needed for severe pain. Must last 30 days. 120 tablet 0   Multiple Vitamins-Minerals (ICAPS AREDS 2 PO) Take 1 capsule by mouth daily.     nitroGLYCERIN (NITROSTAT) 0.4 MG SL tablet DISSOLVE 1 TABLET UNDER TONGUE AS NEEDEDFOR CHEST PAIN. MAY REPEAT 5 MINUTES APART 3 TIMES IF NEEDED. IF NO RELIEF CALL 911 25 tablet 0   ondansetron (ZOFRAN) 4 MG tablet Take 1 tablet (4 mg total) by mouth every 6 (six) hours. 12 tablet 0   OneTouch Delica Lancets 50P MISC 1 each by In Vitro route daily. Dx Code E11.49 100 each 3   OXYGEN Inhale 2 L into the lungs at bedtime. Per family Uses Oxygen 2 liters sometimes during the day     pantoprazole (PROTONIX) 40 MG tablet TAKE 1 TABLET BY MOUTH TWICE (2) DAILY 180 tablet 3   polyethylene glycol (MIRALAX / GLYCOLAX)  17 g packet Take 17 g by mouth every other day.     pregabalin (LYRICA) 25 MG capsule Take 1 capsule (25 mg total) by mouth 2 (two) times daily. 60 capsule 2   pregabalin (LYRICA) 50 MG capsule Take 50 mg by mouth at bedtime.     tamsulosin (FLOMAX) 0.4 MG CAPS capsule Take 0.4 mg by mouth at bedtime.     No current facility-administered medications on file prior to visit.    Allergies  Allergen Reactions   Metronidazole     Kidney Dr does not want pt to take    Doxazosin Mesylate Other (See Comments)    dizziness   Methocarbamol Rash    Past Medical History:  Diagnosis Date   Arthritis    osteoarthritis, s/p R TKR, and digits   CAD (coronary artery disease)    a. s/p CABG (2001)  b. s/p DES to RCA and cutting POBA to ostial PDA (2013)   c. s/p DES to SVG to OM2 (01/14/14) d. cath: 08/2015 NSTEMI w/ patent LIMA-LAD and 99% stenosis of SVG-OM w/ DES placed. CTO of SVG-RCA and SVG-D1.    Cancer [facility])    Cataract    Chronic diastolic CHF (congestive heart failure) ([place])    a)  09/13 ECHO- LVEF 38-46%, grade 1 diastolic dysfunction, mild LA dilatation, atrial septal aneurysm, AV mobility restricted, but no sig AS by doppler; b) 09/04/08 ECHO- LVH, ef 60%, mild AS, c. echo 08/2015: EF perserved of 55-60% with inferolateral HK. Mild AS noted.   Chronic kidney disease, stage III (moderate) ([name])    Chronic lower back pain    Colon polyps    COVID-19    Diverticulosis    Dyspnea 2009 since July -Sept   05/06/08-CPST-  normal effort, reduced VO2 max 20.5 /65%, reduced at 8.2/ 40%, normal breathing resetvca of 55%, submaximal heart rate response 112/77%, flattened o2 pluse response at peak exercise-12 ml/beat @ 85%, No VQ mismatch abnormalities, All c/w CIRC Limitation   Enlarged prostate    Esophageal stricture    a. s/p dilation spring 2010   GERD (gastroesophageal reflux disease)    Heart murmur    Hiatal hernia    History of carpal tunnel syndrome    Bilateral   History of kidney  stones    History of PFTs    mixed pattern on spiro. mild restn on lung volumes with near normal DLCO. Pattern can be explained by CABG scar. Fev1 2.2L/73%, ratio 68 (67), TLC 4.7/68%,RV 1.5L/55%,DLCO 79%   Hyperlipidemia    Hypertension    Interstitial lung disease ([name])    NOS   Iron deficiency anemia    Myocardial infarction ([name])    Nausea & vomiting    2018/2019   On home oxygen therapy    2 L [place] at bedtime   Osteoporosis    Overweight (BMI 25.0-29.9)    BMI 29   Peripheral neuropathy    RA (rheumatoid arthritis) ([place])    [name]   Seropositive rheumatoid arthritis ([place])    Type II diabetes mellitus ([place])    diet controlled   Wears glasses    Wears partial dentures    upper    Past Surgical History:  Procedure Laterality Date   BALLOON DILATION N/A 09/12/2018   Procedure: BALLOON DILATION;  Surgeon: Irene Shipper, MD;  [facility]: [facility];  Service: Endoscopy;  Laterality: N/A;   BALLOON DILATION N/A 02/20/2020   Procedure: BALLOON DILATION;  Surgeon: Irene Shipper, MD;  [facility];  Service: Endoscopy;  Laterality: N/A;   BOTOX INJECTION N/A 09/12/2018   Procedure: BOTOX INJECTION;  Surgeon: Irene Shipper, MD;  [facility];  Service: Endoscopy;  Laterality: N/A;   BOTOX INJECTION N/A 11/27/2018   Procedure: BOTOX INJECTION;  Surgeon: Irene Shipper, MD;  [facility];  Service: Endoscopy;  Laterality: N/A;   BOTOX INJECTION N/A 02/20/2020   Procedure: BOTOX INJECTION;  Surgeon: Irene Shipper, MD;  [facility];  Service: Endoscopy;  Laterality: N/A;   CARDIAC CATHETERIZATION  08/2004   CP- no MI, Cath- small vessell disease    CARDIAC CATHETERIZATION  12/31/2011   80% distal LM, 100% native LAD, LCx and RCA, 30% prox SVG-OM, SVG-D1 normal, 99% distal, 80% ostial SVG-RCA distal to graft, LIMA-LAD normal; LVEF mildly decreased with posterior basal AK    CARDIAC CATHETERIZATION  2009   with patent grafts/notes 12/31/2011    CARDIAC CATHETERIZATION N/A 08/13/2015   Procedure: Left Heart Cath and Cors/Grafts Angiography;  Surgeon: Sherren Mocha, MD;  Location: [facility];  Service: Cardiovascular;  Laterality: N/A;   CATARACT EXTRACTION W/ INTRAOCULAR LENS  IMPLANT, BILATERAL Bilateral    CHOLECYSTECTOMY OPEN  11/2003   [name]   CORONARY  ANGIOPLASTY WITH STENT PLACEMENT  01/03/2012   Successful DES to SVG-RCA and cutting balloon angioplasty ostial  PDA    CORONARY ANGIOPLASTY WITH STENT PLACEMENT  01/14/2014   "1"   CORONARY ARTERY BYPASS GRAFT  11/1999   CABG X5   CORONARY STENT PLACEMENT  02/2012   1 stent and balloon   CYSTOSCOPY WITH BIOPSY N/A 03/17/2021   Procedure: DIAGNOSTIC CYSTOSCOPY WITH BIOPSY;  Surgeon: Robley Fries, MD;  [facility];  Service: Urology;  Laterality: N/A;  1 HR   ESOPHAGEAL DILATION  11/27/2018   Procedure: ESOPHAGEAL DILATION;  Surgeon: Irene Shipper, MD;  [facility];  Service: Endoscopy;;   ESOPHAGOGASTRODUODENOSCOPY N/A 03/01/2017   Procedure: ESOPHAGOGASTRODUODENOSCOPY (EGD);  Surgeon: Irene Shipper, MD;  Location: [facility];  Service: Endoscopy;  Laterality: N/A;   ESOPHAGOGASTRODUODENOSCOPY (EGD) WITH ESOPHAGEAL DILATION  2010   ESOPHAGOGASTRODUODENOSCOPY (EGD) WITH PROPOFOL N/A 09/12/2018   Procedure: ESOPHAGOGASTRODUODENOSCOPY (EGD) WITH PROPOFOL;  Surgeon: Irene Shipper, MD;  [facility];  Service: Endoscopy;  Laterality: N/A;   ESOPHAGOGASTRODUODENOSCOPY (EGD) WITH PROPOFOL N/A 11/27/2018   Procedure: ESOPHAGOGASTRODUODENOSCOPY (EGD) WITH PROPOFOL, WITH BALLOON DILATION;  Surgeon: Irene Shipper, MD;  [facility];  Service: Endoscopy;  Laterality: N/A;   ESOPHAGOGASTRODUODENOSCOPY (EGD) WITH PROPOFOL N/A 02/20/2020   Procedure: ESOPHAGOGASTRODUODENOSCOPY (EGD) WITH PROPOFOL;  Surgeon: Irene Shipper, MD;  [facility];  Service: Endoscopy;  Laterality: N/A;   HAND SURGERY     bilateral carpal tunnel releases   JOINT  REPLACEMENT     KNEE ARTHROSCOPY Right 2008   LEFT AND RIGHT HEART CATHETERIZATION WITH CORONARY ANGIOGRAM  12/31/2011   Procedure: LEFT AND RIGHT HEART CATHETERIZATION WITH CORONARY ANGIOGRAM;  Surgeon: Burnell Blanks, MD;  Location: [facility] CATH LAB;  Service: Cardiovascular;;   LEFT AND RIGHT HEART CATHETERIZATION WITH CORONARY ANGIOGRAM N/A 01/14/2014   Procedure: LEFT AND RIGHT HEART CATHETERIZATION WITH CORONARY ANGIOGRAM;  Surgeon: Peter M Martinique, MD;  Location: [facility] CATH LAB;  Service: Cardiovascular;  Laterality: N/A;   MALONEY DILATION  03/01/2017   Procedure: Venia Minks DILATION;  Surgeon: Irene Shipper, MD;  [facility];  Service: Endoscopy;;   PERCUTANEOUS CORONARY INTERVENTION-BALLOON ONLY  01/03/2012   Procedure: PERCUTANEOUS CORONARY INTERVENTION-BALLOON ONLY;  Surgeon: Peter M Martinique, MD;  Location: [facility] CATH LAB;  Service: Cardiovascular;;   PERCUTANEOUS CORONARY STENT INTERVENTION (PCI-S)  12/31/2011   Procedure: PERCUTANEOUS CORONARY STENT INTERVENTION (PCI-S);  Surgeon: Burnell Blanks, MD;  Location: [facility] CATH LAB;  Service: Cardiovascular;;   PERCUTANEOUS CORONARY STENT INTERVENTION (PCI-S) N/A 01/03/2012   Procedure: PERCUTANEOUS CORONARY STENT INTERVENTION (PCI-S);  Surgeon: Peter M Martinique, MD;  Location: [facility] CATH LAB;  Service: Cardiovascular;  Laterality: N/A;   SHOULDER ARTHROSCOPY WITH OPEN ROTATOR CUFF REPAIR AND DISTAL CLAVICLE ACROMINECTOMY Left 02/27/2013   Procedure: LEFT SHOULDER ARTHROSCOPY WITH MINI OPEN ROTATOR CUFF REPAIR AND SUBACROMIAL DECOMPRESSION AND DISTAL CLAVICLE RESECTION;  Surgeon: [name], MD;  Location: [place];  Service: Orthopedics;  Laterality: Left;   TOTAL KNEE ARTHROPLASTY Right 03/2010   [name]   TRANSURETHRAL RESECTION OF BLADDER TUMOR N/A 03/17/2021   Procedure: TRANSURETHRAL RESECTION OF BLADDER TUMOR (TURBT);  Surgeon: Robley Fries, MD;  [facility];  Service: Urology;  Laterality: N/A;   TRANSURETHRAL  RESECTION OF BLADDER TUMOR WITH MITOMYCIN-C N/A 10/08/2020   Procedure: TRANSURETHRAL RESECTION OF BLADDER TUMOR WITH GEMCITABINE;  Surgeon: Robley Fries, MD;  [facility];  Service: Urology;  Laterality: N/A;  75  MINS   TRIGGER FINGER RELEASE Left 02/27/2013   Procedure: RELEASE TRIGGER FINGER/A-1 PULLEY;  Surgeon: [name], MD;  Location: [facility];  Service: Orthopedics;  Laterality: Left;    Family History  Problem Relation Age of Onset   COPD Mother    Heart disease Father    Heart attack Father    Stomach cancer Brother    Stroke Sister    Alcohol abuse Sister    Colon cancer Brother 12   Diabetes Brother    Rectal cancer Neg Hx     Social History   Socioeconomic History   Marital status: Married    Spouse name: Not on file   Number of children: 3   Years of education: Not on file   Highest education level: Not on file  Occupational History   Occupation: Designer, jewellery: RETIRED    Comment: retired  Tobacco Use   Smoking status: Former    Packs/day: 1.00    Years: 20.00    Pack years: 20.00    Types: Cigarettes    Quit date: 04/06/1963    Years since quitting: 58.1   Smokeless tobacco: Former  Scientific laboratory technician Use: Never used  Substance and Sexual Activity   Alcohol use: No    Alcohol/week: 0.0 standard drinks    Comment: 01/01/2012 "last alcohol ~ 50 yr ago"   Drug use: No   Sexual activity: Not Currently  Other Topics Concern   Not on file  Social History Narrative   No living will   Requests wife as health care POA-- alternate is daughter Hassan Rowan   Discussed DNR --he requests this (done 08/29/12)   Not sure about feeding tube---but might accept for some time   Patient lives with wife and daughter in a one story home.  Has 3 children.  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   Social Determinants of Health   Financial Resource Strain: Low Risk    Difficulty of Paying Living Expenses: Not very hard  Food  Insecurity: Not on file  Transportation Needs: Not on file  Physical Activity: Not on file  Stress: Not on file  Social Connections: Not on file  Intimate Partner Violence: Not on file   Review of Systems Appetite is better Weight up 8#     Objective:   Physical Exam Constitutional:      Appearance: Normal appearance.  Cardiovascular:     Rate and Rhythm: Normal rate and regular rhythm.     Heart sounds: No murmur heard.   No gallop.  Pulmonary:     Effort: Pulmonary effort is normal.     Breath sounds: No wheezing or rales.     Comments: Decreased breath sounds but clear Musculoskeletal:     Cervical back: Neck supple.     Right lower leg: No edema.     Left lower leg: No edema.  Lymphadenopathy:     Cervical: No cervical adenopathy.  Neurological:     Mental Status: He is alert.           Assessment & Plan:

## 2021-05-08 NOTE — Progress Notes (Signed)
Patient to arrive at [facility] on 05/11/2021. History and medications reviewed with daughter [name]. She states patient does not have any mental acuity deficiencies but is HOH and relies on her "to explain things." Patient uses oxygen at night for COPD and occasionally during the day time with increased activity. [facility] notified, patient will require anesthesia provider per [place] due to O2 dependence. Patients last dose of plavix and ASA was on 05/05/2021 per ok his cardiologist. Pre-procedure instructions given. Driver secured.

## 2021-05-09 NOTE — Anesthesia Preprocedure Evaluation (Addendum)
Anesthesia Evaluation  Patient identified by MRN, date of birth, ID band Patient awake    Reviewed: Allergy & Precautions, NPO status , Patient's Chart, lab work & pertinent test results  Airway Mallampati: II  TM Distance: >3 FB Neck ROM: Full    Dental  (+) Dental Advisory Given, Partial Upper   Pulmonary shortness of breath (2L O2 _0 ), sleep apnea and Oxygen sleep apnea , former smoker,  ILD   Pulmonary exam normal breath sounds clear to auscultation       Cardiovascular hypertension, + CAD, + Past MI, + Cardiac Stents, + CABG and +CHF  Normal cardiovascular exam Rhythm:Regular Rate:Normal     Neuro/Psych  Neuromuscular disease    GI/Hepatic hiatal hernia, GERD  Medicated,  Endo/Other  diabetes, Type 2  Renal/GU Renal InsufficiencyRenal diseaseRIGHT URETERAL CALCULUS     Musculoskeletal  (+) Arthritis , Rheumatoid disorders,    Abdominal   Peds  Hematology  (+) Blood dyscrasia (Plavix), ,   Anesthesia Other Findings   Reproductive/Obstetrics                           Anesthesia Physical  Anesthesia Plan  ASA: 3  Anesthesia Plan: MAC   Post-op Pain Management:    Induction: Intravenous  PONV Risk Score and Plan: 1 and Treatment may vary due to age or medical condition and TIVA  Airway Management Planned: Simple Face Mask and Natural Airway  Additional Equipment: None  Intra-op Plan:   Post-operative Plan:   Informed Consent: I have reviewed the patients History and Physical, chart, labs and discussed the procedure including the risks, benefits and alternatives for the proposed anesthesia with the patient or authorized representative who has indicated his/her understanding and acceptance.     Dental advisory given  Plan Discussed with: CRNA  Anesthesia Plan Comments:        Anesthesia Quick Evaluation

## 2021-05-11 ENCOUNTER — Ambulatory Visit (HOSPITAL_BASED_OUTPATIENT_CLINIC_OR_DEPARTMENT_OTHER): Payer: Medicare Other | Admitting: Anesthesiology

## 2021-05-11 ENCOUNTER — Encounter (HOSPITAL_BASED_OUTPATIENT_CLINIC_OR_DEPARTMENT_OTHER): Payer: Self-pay | Admitting: Urology

## 2021-05-11 ENCOUNTER — Other Ambulatory Visit: Payer: Self-pay | Admitting: Student in an Organized Health Care Education/Training Program

## 2021-05-11 ENCOUNTER — Other Ambulatory Visit: Payer: Self-pay

## 2021-05-11 ENCOUNTER — Ambulatory Visit (HOSPITAL_BASED_OUTPATIENT_CLINIC_OR_DEPARTMENT_OTHER)
Admission: RE | Admit: 2021-05-11 | Discharge: 2021-05-11 | Disposition: A | Discharge status: Home / Self Care | Payer: Medicare Other | Attending: Urology | Admitting: Urology

## 2021-05-11 ENCOUNTER — Ambulatory Visit (HOSPITAL_COMMUNITY): Payer: Medicare Other

## 2021-05-11 ENCOUNTER — Encounter (HOSPITAL_BASED_OUTPATIENT_CLINIC_OR_DEPARTMENT_OTHER)
Admission: RE | Disposition: A | Discharge status: Home / Self Care | Payer: Self-pay | Source: Home / Self Care | Attending: Urology

## 2021-05-11 DIAGNOSIS — N2889 Other specified disorders of kidney and ureter: Secondary | ICD-10-CM | POA: Diagnosis not present

## 2021-05-11 DIAGNOSIS — Z955 Presence of coronary angioplasty implant and graft: Secondary | ICD-10-CM | POA: Diagnosis not present

## 2021-05-11 DIAGNOSIS — I110 Hypertensive heart disease with heart failure: Secondary | ICD-10-CM | POA: Diagnosis not present

## 2021-05-11 DIAGNOSIS — I10 Essential (primary) hypertension: Secondary | ICD-10-CM | POA: Insufficient documentation

## 2021-05-11 DIAGNOSIS — N201 Calculus of ureter: Secondary | ICD-10-CM

## 2021-05-11 DIAGNOSIS — C679 Malignant neoplasm of bladder, unspecified: Secondary | ICD-10-CM | POA: Insufficient documentation

## 2021-05-11 DIAGNOSIS — M792 Neuralgia and neuritis, unspecified: Secondary | ICD-10-CM

## 2021-05-11 DIAGNOSIS — J449 Chronic obstructive pulmonary disease, unspecified: Secondary | ICD-10-CM | POA: Insufficient documentation

## 2021-05-11 DIAGNOSIS — Z87442 Personal history of urinary calculi: Secondary | ICD-10-CM | POA: Diagnosis not present

## 2021-05-11 DIAGNOSIS — I509 Heart failure, unspecified: Secondary | ICD-10-CM | POA: Diagnosis not present

## 2021-05-11 DIAGNOSIS — E1142 Type 2 diabetes mellitus with diabetic polyneuropathy: Secondary | ICD-10-CM

## 2021-05-11 DIAGNOSIS — I2510 Atherosclerotic heart disease of native coronary artery without angina pectoris: Secondary | ICD-10-CM | POA: Diagnosis not present

## 2021-05-11 DIAGNOSIS — E119 Type 2 diabetes mellitus without complications: Secondary | ICD-10-CM | POA: Insufficient documentation

## 2021-05-11 DIAGNOSIS — I252 Old myocardial infarction: Secondary | ICD-10-CM | POA: Insufficient documentation

## 2021-05-11 DIAGNOSIS — G894 Chronic pain syndrome: Secondary | ICD-10-CM

## 2021-05-11 HISTORY — PX: EXTRACORPOREAL SHOCK WAVE LITHOTRIPSY: SHX1557

## 2021-05-11 LAB — GLUCOSE, CAPILLARY: Glucose-Capillary: 149 mg/dL — ABNORMAL HIGH (ref 70–99)

## 2021-05-11 SURGERY — LITHOTRIPSY, ESWL
Anesthesia: Monitor Anesthesia Care | Laterality: Right

## 2021-05-11 MED ORDER — FENTANYL CITRATE (PF) 100 MCG/2ML IJ SOLN
INTRAMUSCULAR | Status: DC | PRN
  Administered 2021-05-11 (×2): 25 ug via INTRAVENOUS

## 2021-05-11 MED ORDER — DIAZEPAM 5 MG PO TABS
10.0000 mg | ORAL_TABLET | ORAL | Status: AC
  Administered 2021-05-11: 10 mg via ORAL

## 2021-05-11 MED ORDER — ONDANSETRON HCL 4 MG/2ML IJ SOLN
INTRAMUSCULAR | Status: AC
  Filled 2021-05-11: qty 2

## 2021-05-11 MED ORDER — SODIUM CHLORIDE 0.9 % IV SOLN: INTRAVENOUS | Status: DC

## 2021-05-11 MED ORDER — FENTANYL CITRATE (PF) 100 MCG/2ML IJ SOLN
INTRAMUSCULAR | Status: AC
  Filled 2021-05-11: qty 2

## 2021-05-11 MED ORDER — PROPOFOL 10 MG/ML IV BOLUS
INTRAVENOUS | Status: DC | PRN
Start: 2021-05-11 — End: 2021-05-11
  Administered 2021-05-11: 30 ug/kg/min via INTRAVENOUS

## 2021-05-11 MED ORDER — DIAZEPAM 5 MG PO TABS
ORAL_TABLET | ORAL | Status: AC
  Filled 2021-05-11: qty 2

## 2021-05-11 MED ORDER — DIPHENHYDRAMINE HCL 25 MG PO CAPS
ORAL_CAPSULE | ORAL | Status: AC
  Filled 2021-05-11: qty 1

## 2021-05-11 MED ORDER — OXYCODONE HCL 5 MG PO TABS: 5.0000 mg | ORAL_TABLET | ORAL | Status: DC | PRN

## 2021-05-11 MED ORDER — ONDANSETRON HCL 4 MG/2ML IJ SOLN
INTRAMUSCULAR | Status: DC | PRN
  Administered 2021-05-11: 4 mg via INTRAVENOUS

## 2021-05-11 MED ORDER — CIPROFLOXACIN HCL 500 MG PO TABS
500.0000 mg | ORAL_TABLET | ORAL | Status: AC
  Administered 2021-05-11: 500 mg via ORAL

## 2021-05-11 MED ORDER — CIPROFLOXACIN HCL 500 MG PO TABS
ORAL_TABLET | ORAL | Status: AC
  Filled 2021-05-11: qty 1

## 2021-05-11 MED ORDER — DIPHENHYDRAMINE HCL 25 MG PO CAPS
25.0000 mg | ORAL_CAPSULE | ORAL | Status: AC
  Administered 2021-05-11: 25 mg via ORAL

## 2021-05-11 NOTE — Op Note (Signed)
See [facility] OP note scanned into chart. Also because of the size, density, location and other factors that cannot be anticipated I feel this will likely be a staged procedure. This fact supersedes any indication in the scanned [facility] operative note to the contrary.  

## 2021-05-11 NOTE — H&P (Signed)
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 He has completed induction BCG. Surveillance cystoscopy was difficult to visualize bladder mucosa due to debris. Patient underwent cystoscopy with bladder biopsy and fulguration last week. Bladder biopsy showed scant fibrous tissue. No notable recurrence noted. He is here today for Foley catheter removal and pathology results.   04/22/21: 88 year old male who presents today with concerns of urinary frequency and urgency associated with dysuria and generalized fatigue. 2 weeks ago he was found to have a right distal stone measuring approximately 6 mm this was easily visualized on CT scout image. He feels that he has passed the stone although has not seen the stone pass. He continues with tamsulosin. His urinalysis has bacteria and red blood cells. He denies fevers. His is on Plavix   04/23/2021: 88 year old male who presents today with urinary frequency of every 30 to 45 minutes, dysuria, and feeling like he is not emptying his bladder. He has a known right distal stone measuring approximately 6 mm. He elected to pursue ESWL instead of ureteroscopy. We are awaiting ESWL scheduling. He understands he will need to be off Plavix for this. He reports that he has not had a fever and does not feel like he has had a fever or had worsening dizziness. He has baseline dizziness and weakness which is not new for him. He has pain medication which he has been using sparingly. He is currently on tamsulosin.     ALLERGIES: No Known Drug Allergies    MEDICATIONS: Aspirin  Augmentin 500 mg-125 mg tablet 1 tablet PO Q 12 H  Plavix 75 mg tablet  Amlodipine Besylate 5 mg tablet  Coreg 6.25 mg tablet  Flomax 0.4 mg capsule  Lasix 40 mg tablet  Lyrica 50 mg capsule  Nitrostat  Protonix 40 mg tablet, delayed release  Rybelsus 3 mg tablet  Vitamin C      GU PSH: Bladder Instill AntiCA Agent - 01/26/2021, 01/19/2021, 01/12/2021, 12/29/2020, 12/01/2020, 11/24/2020, 11/10/2020, 11/03/2020 Cystoscopy - 02/09/2021, 10/01/2020 Cystoscopy TURBT <2 cm - 03/17/2021 Cystoscopy TURBT 2-5 cm - 10/08/2020     NON-GU PSH: Coronary Artery Bypass Grafting Hand/finger Surgery, Bilateral Knee Arthroscopy, Right Shoulder Arthroscopy/surgery, Left     GU PMH: Gross hematuria - 04/22/2021, - 10/01/2020, - 09/23/2020 Ureteral calculus - 04/22/2021 Bladder Cancer Posterior - 03/23/2021, - 02/09/2021, - 01/26/2021, - 01/19/2021, - 01/12/2021, - 01/05/2021, - 12/29/2020, - 12/01/2020, - 11/24/2020, - 11/10/2020, - 11/03/2020, - 10/17/2020 Bladder tumor/neoplasm - 10/01/2020, - 09/23/2020 Chronic kidney disease stage 4 (GFR 15-30) Unil Inguinal Hernia W/O obst or gang,non-recurrent    NON-GU PMH: Encounter for antineoplastic chemotherapy - 11/03/2020 Achalasia of cardia Arthritis Atherosclerotic heart disease of native coronary artery with unspecified angina pectoris Bilateral primary osteoarthritis of knee Cardiac murmur, unspecified Chronic diastolic (congestive) heart failure Constipation, unspecified Diabetes Type 2 Diverticulosis of large intestine without perforation or abscess without bleeding Dysphagia, unspecified GERD Glaucoma Interstitial pulmonary disease, unspecified Iron deficiency anemia, unspecified Neuralgia and neuritis, unspecified Obstructive sleep apnea (adult) (pediatric) Opioid dependence, uncomplicated Orthostatic hypotension Polyneuropathy, unspecified Presence of right artificial knee joint Rheumatoid arthritis with rheumatoid factor, unspecified Ventricular tachycardia    FAMILY HISTORY: 3 daughters - Runs in Family Alcoholism - Sister Colon Cancer - Brother copd - Mother Diabetes - Brother Heart Disease - Father Prostate Cancer - Brother   SOCIAL HISTORY: Marital Status: Married Preferred Language: English; Ethnicity: Not Hispanic Or  Latino; Race: White Current Smoking Status: Patient smokes occasionally.   Tobacco Use Assessment Completed: Used Tobacco in last 30 days? Has never drank.  Drinks 2 caffeinated drinks per day.    REVIEW OF SYSTEMS:    GU Review Male:   Patient reports frequent urination, hard to postpone urination, burning/ pain with urination, and get up at night to urinate. Patient denies leakage of urine, stream starts and stops, trouble starting your stream, have to strain to urinate , erection problems, and penile pain.  Gastrointestinal (Upper):   Patient denies vomiting, nausea, and indigestion/ heartburn.  Gastrointestinal (Lower):   Patient denies diarrhea and constipation.  Constitutional:   Patient denies fever, night sweats, weight loss, and fatigue.  Skin:   Patient denies skin rash/ lesion and itching.  Eyes:   Patient denies blurred vision and double vision.  Musculoskeletal:   Patient reports back pain. Patient denies joint pain.  Neurological:   Patient reports dizziness. Patient denies headaches.   VITAL SIGNS:      04/23/2021 01:49 PM  Weight 193 lb / 87.54 kg  Height 72 in / 182.88 cm  BP 151/88 mmHg  Pulse 65 /min  Temperature 98.2 F / 36.7 C  BMI 26.2 kg/m   MULTI-SYSTEM PHYSICAL EXAMINATION:    Constitutional: Well-nourished. No physical deformities. Normally developed. Good grooming.  Cardiovascular: Normal temperature, normal extremity pulses, no swelling, no varicosities.  Skin: No paleness, no jaundice, no cyanosis. No lesion, no ulcer, no rash.  Neurologic / Psychiatric: Oriented to time, oriented to place, oriented to person. No depression, no anxiety, no agitation.  Gastrointestinal: No mass, no tenderness, no rigidity, non obese abdomen.     Complexity of Data:  Source Of History:  Patient  Records Review:   Previous Doctor Records, Previous Patient Records  Urine Test Review:   Urinalysis, Urine Culture  X-Ray Review: KUB: Reviewed Films. Reviewed Report.  Discussed With Patient.     04/23/21  Urinalysis  Urine Appearance Turbid   Urine Color Yellow   Urine Glucose Neg mg/dL  Urine Bilirubin Neg mg/dL  Urine Ketones Neg mg/dL  Urine Specific Gravity 1.020   Urine Blood 2+ ery/uL  Urine pH 6.0   Urine Protein 2+ mg/dL  Urine Urobilinogen 0.2 mg/dL  Urine Nitrites Neg   Urine Leukocyte Esterase 3+ leu/uL  Urine WBC/hpf 40 - 60/hpf   Urine RBC/hpf 20 - 40/hpf   Urine Epithelial Cells NS (Not Seen)   Urine Bacteria NS (Not Seen)   Urine Mucous Present   Urine Yeast NS (Not Seen)   Urine Trichomonas Not Present   Urine Cystals NS (Not Seen)   Urine Casts NS (Not Seen)   Urine Sperm Not Present    PROCEDURES:         KUB - [id number]  A single view of the abdomen is obtained.A single view of the abdomen is obtained. there is a 16mm opacity within the right pelvic inlet that is near the expected anatomical course of the right UVJ. OVerlying bowel gas.       Patient confirmed No Neulasta OnPro Device.           Urinalysis w/Scope Dipstick Dipstick Cont'd Micro  Color: Yellow Bilirubin: Neg mg/dL WBC/hpf: 40 - 60/hpf  Appearance: Turbid Ketones: Neg mg/dL RBC/hpf: 20 - 40/hpf  Specific Gravity: 1.020 Blood: 2+ ery/uL Bacteria: NS (Not Seen)  pH: 6.0 Protein: 2+ mg/dL Cystals: NS (Not Seen)  Glucose: Neg mg/dL Urobilinogen: 0.2 mg/dL Casts: NS (Not Seen)  Nitrites: Neg Trichomonas: Not Present    Leukocyte Esterase: 3+ leu/uL Mucous: Present      Epithelial Cells: NS (Not Seen)      Yeast: NS (Not Seen)      Sperm: Not Present    ASSESSMENT:      ICD-10 Details  1 GU:   Ureteral calculus - N20.1 Right, Acute, Systemic Symptoms   PLAN:            Medications New Meds: Pyridium 200 mg tablet 1 tablet PO BID   #10  0 Refill(s)  Pharmacy Name:  [facility]  Address:  [address]   [place], [place] [address]  Phone:  [phone]  Fax:  [phone]            Orders X-Rays: KUB           Schedule         Document Letter(s):  Created for Patient: Clinical Summary         Notes:   Joaquim Lai has not moved much from previous KUB. He is not febrile. His vital signs are within normal limits. I gave him the option of a ureteral stent placement urgently today. He decided against this option due to not wanting to undergo anesthesia again. I advised the use of Pyridium. I also encouraged him to increase his water intake. I advised very strict return precautions for worsening symptomatology. His PVR was 106 mL which is very reassuring today. He is currently on antibiotics for presumed infection and culture results should present tomorrow.

## 2021-05-11 NOTE — Interval H&P Note (Signed)
History and Physical Interval Note:  05/11/2021 10:08 AM  [name]  has presented today for surgery, with the diagnosis of RIGHT URETERAL CALCULUS.  The various methods of treatment have been discussed with the patient and family. After consideration of risks, benefits and other options for treatment, the patient has consented to  Procedure(s): EXTRACORPOREAL SHOCK WAVE LITHOTRIPSY (ESWL) (Right) as a surgical intervention.  The patient's history has been reviewed, patient examined, no change in status, stable for surgery.  I have reviewed the patient's chart and labs.  Questions were answered to the patient's satisfaction.     [name]

## 2021-05-11 NOTE — Discharge Instructions (Addendum)
See [facility] discharge instructions in chart.   RESUME PLAVIX IN 10 HOURS OR AFTER THE BLOOD IN THE URINE HAS CLEARED.    Post Anesthesia Home Care Instructions  Activity: Get plenty of rest for the remainder of the day. A responsible individual must stay with you for 24 hours following the procedure.  For the next 24 hours, DO NOT: -Drive a car -Paediatric nurse -Drink alcoholic beverages -Take any medication unless instructed by your physician -Make any legal decisions or sign important papers.  Meals: Start with liquid foods such as gelatin or soup. Progress to regular foods as tolerated. Avoid greasy, spicy, heavy foods. If nausea and/or vomiting occur, drink only clear liquids until the nausea and/or vomiting subsides. Call your physician if vomiting continues.  Special Instructions/Symptoms: Your throat may feel dry or sore from the anesthesia or the breathing tube placed in your throat during surgery. If this causes discomfort, gargle with warm salt water. The discomfort should disappear within 24 hours.  If you had a scopolamine patch placed behind your ear for the management of post- operative nausea and/or vomiting:  1. The medication in the patch is effective for 72 hours, after which it should be removed.  Wrap patch in a tissue and discard in the trash. Wash hands thoroughly with soap and water. 2. You may remove the patch earlier than 72 hours if you experience unpleasant side effects which may include dry mouth, dizziness or visual disturbances. 3. Avoid touching the patch. Wash your hands with soap and water after contact with the patch.

## 2021-05-11 NOTE — Transfer of Care (Signed)
Immediate Anesthesia Transfer of Care Note  Patient: [name]  Procedure(s) Performed: EXTRACORPOREAL SHOCK WAVE LITHOTRIPSY (ESWL) (Right)  Patient Location: Short Stay  Anesthesia Type:MAC  Level of Consciousness: awake and patient cooperative  Airway & Oxygen Therapy: Patient Spontanous Breathing  Post-op Assessment: Report given to RN and Post -op Vital signs reviewed and stable  Post vital signs: Reviewed and stable  Last Vitals:  Vitals Value Taken Time  BP 192/94 05/11/21 1144  Temp 36.4 C 05/11/21 1144  Pulse 84 05/11/21 1144  Resp 20 05/11/21 1144  SpO2 96 % 05/11/21 1144    Last Pain:  Vitals:   05/11/21 1017  TempSrc: Oral  PainSc: 0-No pain         Complications: No notable events documented.

## 2021-05-11 NOTE — Anesthesia Postprocedure Evaluation (Signed)
Anesthesia Post Note  Patient: [name]  Procedure(s) Performed: EXTRACORPOREAL SHOCK WAVE LITHOTRIPSY (ESWL) (Right)     Patient location during evaluation: Phase II Anesthesia Type: MAC Level of consciousness: awake and alert Pain management: pain level controlled Vital Signs Assessment: post-procedure vital signs reviewed and stable Respiratory status: spontaneous breathing, nonlabored ventilation and respiratory function stable Cardiovascular status: stable and blood pressure returned to baseline Postop Assessment: no apparent nausea or vomiting Anesthetic complications: no   No notable events documented.  Last Vitals:  Vitals:   05/11/21 1017 05/11/21 1144  BP: (!) 192/83 (!) 192/94  Pulse: 93 84  Resp: 16 20  Temp: (!) 36.4 C (!) 36.4 C  SpO2: 98% 96%    Last Pain:  Vitals:   05/11/21 1017  TempSrc: Oral  PainSc: 0-No pain                 Santa Lighter

## 2021-05-12 ENCOUNTER — Encounter (HOSPITAL_BASED_OUTPATIENT_CLINIC_OR_DEPARTMENT_OTHER): Payer: Self-pay | Admitting: Urology

## 2021-05-18 DIAGNOSIS — J8410 Pulmonary fibrosis, unspecified: Secondary | ICD-10-CM | POA: Diagnosis not present

## 2021-05-18 DIAGNOSIS — R0689 Other abnormalities of breathing: Secondary | ICD-10-CM | POA: Diagnosis not present

## 2021-05-22 ENCOUNTER — Ambulatory Visit: Payer: Medicare Other | Admitting: Neurology

## 2021-05-22 ENCOUNTER — Encounter: Payer: Self-pay | Admitting: Neurology

## 2021-05-22 ENCOUNTER — Other Ambulatory Visit: Payer: Self-pay

## 2021-05-22 VITALS — BP 159/90 | HR 102 | Ht 72.0 in | Wt 182.0 lb

## 2021-05-22 DIAGNOSIS — G7000 Myasthenia gravis without (acute) exacerbation: Secondary | ICD-10-CM | POA: Diagnosis not present

## 2021-05-22 DIAGNOSIS — N201 Calculus of ureter: Secondary | ICD-10-CM | POA: Diagnosis not present

## 2021-05-22 DIAGNOSIS — R300 Dysuria: Secondary | ICD-10-CM | POA: Diagnosis not present

## 2021-05-22 MED ORDER — PYRIDOSTIGMINE BROMIDE 60 MG PO TABS: 60.0000 mg | ORAL_TABLET | Freq: Two times a day (BID) | ORAL | 1 refills | Status: DC

## 2021-05-22 NOTE — Progress Notes (Signed)
[name] Note - Initial Visit   Date: 05/22/21  [name] MRN: [id number] DOB: 01/15/1932   Dear Dr Chase Caller:  Thank you for your kind referral of [name] for consultation of myasthenia gravis. Although his history is well known to you, please allow [name] to reiterate it for the purpose of our medical record. The patient was accompanied to the [name] by daughter, [name], who also provides collateral information.     History of Present Illness: [name] is a 88 y.o. right-handed male with CAD, GERD, interstitial lung disease, rheumatoid arthritis (off imuran since 2019), aortic stenosis, CKD stage 3, hyperlipidemia presenting for evaluation of myasthenia gravis. He has previously seen me in 2018 for painful neuropathy.  He is currently followed by pain management for refractory pain.   Today, he comes for evaluation of myasthenia gravis after his AChR blocking receptor antibody returned mildly positive (28%).  Antibodies were checked by his pulmonologist due to progressive shortness of breath.  Shortness of breath has been longstanding, but has been getting worse over the past year.  He has diastolic dysfunction, aortic stenosis, anemia, CKD, and ILD which can contributed to dyspnea. His most recent CT chest did not show interstitial lung disease, there was significant bronchiectasis.  He uses oxygen at night time.  Shortness of breath is worse with exertion and therefore he is very sedentary.  He sleeps on 2 pillows.    He complains of difficulty swallowing for many years and sees [name] at [facility] who has performed botox injections for esophageal stricture.  He denies slurred speech.    No double vision, droopiness of the eyes, or jaw fatigue.  No arm or leg weakness.  He has painful neuropathy involving the feet for many years and sees pain management.  He also has low back pain and gets facet injections.  He walks with a cane,  no falls.   He lives at home with wife and oldest daughter.  He is able to do his own ADLs.   Out-side paper records, electronic medical record, and images have been reviewed where available and summarized as:  Myasthenia panel 02/20/2021:  AChR binding neg, AChR blocking 28* (normal < 25), AChR modulating neg  Lab Results  Component Value Date   HGBA1C 7.5 (H) 03/10/2021   Lab Results  Component Value Date   VITAMINB12 253 08/10/2016   Lab Results  Component Value Date   TSH 2.100 04/08/2017   Lab Results  Component Value Date   ESRSEDRATE 38 (H) 11/22/2011    Past Medical History:  Diagnosis Date   Arthritis    osteoarthritis, s/p R TKR, and digits   CAD (coronary artery disease)    a. s/p CABG (2001)  b. s/p DES to RCA and cutting POBA to ostial PDA (2013)   c. s/p DES to SVG to OM2 (01/14/14) d. cath: 08/2015 NSTEMI w/ patent LIMA-LAD and 99% stenosis of SVG-OM w/ DES placed. CTO of SVG-RCA and SVG-D1.    Cancer [facility])    Cataract    Chronic diastolic CHF (congestive heart failure) ([place])    a) 09/13 ECHO- LVEF 30-05%, grade 1 diastolic dysfunction, mild LA dilatation, atrial septal aneurysm, AV mobility restricted, but no sig AS by doppler; b) 09/04/08 ECHO- LVH, ef 60%, mild AS, c. echo 08/2015: EF perserved of 55-60% with inferolateral HK. Mild AS noted.   Chronic kidney disease, stage III (moderate) ([name])    Chronic lower back pain  Colon polyps    COVID-19    Diverticulosis    Dyspnea 2009 since July -Sept   05/06/08-CPST-  normal effort, reduced VO2 max 20.5 /65%, reduced at 8.2/ 40%, normal breathing resetvca of 55%, submaximal heart rate response 112/77%, flattened o2 pluse response at peak exercise-12 ml/beat @ 85%, No VQ mismatch abnormalities, All c/w CIRC Limitation   Enlarged prostate    Esophageal stricture    a. s/p dilation spring 2010   GERD (gastroesophageal reflux disease)    Heart murmur    Hiatal hernia    History of carpal tunnel syndrome     Bilateral   History of kidney stones    History of PFTs    mixed pattern on spiro. mild restn on lung volumes with near normal DLCO. Pattern can be explained by CABG scar. Fev1 2.2L/73%, ratio 68 (67), TLC 4.7/68%,RV 1.5L/55%,DLCO 79%   Hyperlipidemia    Hypertension    Interstitial lung disease ([name])    NOS   Iron deficiency anemia    Myocardial infarction ([name])    Nausea & vomiting    2018/2019   On home oxygen therapy    2 L [place] at bedtime   Osteoporosis    Overweight (BMI 25.0-29.9)    BMI 29   Peripheral neuropathy    RA (rheumatoid arthritis) ([place])    [name]   Seropositive rheumatoid arthritis ([place])    Type II diabetes mellitus ([place])    diet controlled   Wears glasses    Wears partial dentures    upper    Past Surgical History:  Procedure Laterality Date   BALLOON DILATION N/A 09/12/2018   Procedure: BALLOON DILATION;  Surgeon: Irene Shipper, MD;  Location: [facility];  Service: Endoscopy;  Laterality: N/A;   BALLOON DILATION N/A 02/20/2020   Procedure: BALLOON DILATION;  Surgeon: Irene Shipper, MD;  [facility];  Service: Endoscopy;  Laterality: N/A;   BOTOX INJECTION N/A 09/12/2018   Procedure: BOTOX INJECTION;  Surgeon: Irene Shipper, MD;  [facility];  Service: Endoscopy;  Laterality: N/A;   BOTOX INJECTION N/A 11/27/2018   Procedure: BOTOX INJECTION;  Surgeon: Irene Shipper, MD;  [facility];  Service: Endoscopy;  Laterality: N/A;   BOTOX INJECTION N/A 02/20/2020   Procedure: BOTOX INJECTION;  Surgeon: Irene Shipper, MD;  [facility];  Service: Endoscopy;  Laterality: N/A;   CARDIAC CATHETERIZATION  08/2004   CP- no MI, Cath- small vessell disease    CARDIAC CATHETERIZATION  12/31/2011   80% distal LM, 100% native LAD, LCx and RCA, 30% prox SVG-OM, SVG-D1 normal, 99% distal, 80% ostial SVG-RCA distal to graft, LIMA-LAD normal; LVEF mildly decreased with posterior basal AK    CARDIAC CATHETERIZATION  2009   with patent  grafts/notes 12/31/2011   CARDIAC CATHETERIZATION N/A 08/13/2015   Procedure: Left Heart Cath and Cors/Grafts Angiography;  Surgeon: Sherren Mocha, MD;  Location: [facility];  Service: Cardiovascular;  Laterality: N/A;   CATARACT EXTRACTION W/ INTRAOCULAR LENS  IMPLANT, BILATERAL Bilateral    CHOLECYSTECTOMY OPEN  11/2003   [name]   CORONARY ANGIOPLASTY WITH STENT PLACEMENT  01/03/2012   Successful DES to SVG-RCA and cutting balloon angioplasty ostial  PDA    CORONARY ANGIOPLASTY WITH STENT PLACEMENT  01/14/2014   "1"   CORONARY ARTERY BYPASS GRAFT  11/1999   CABG X5   CORONARY STENT PLACEMENT  02/2012   1 stent and balloon   CYSTOSCOPY WITH BIOPSY N/A  03/17/2021   Procedure: DIAGNOSTIC CYSTOSCOPY WITH BIOPSY;  Surgeon: Robley Fries, MD;  [facility];  Service: Urology;  Laterality: N/A;  1 HR   ESOPHAGEAL DILATION  11/27/2018   Procedure: ESOPHAGEAL DILATION;  Surgeon: Irene Shipper, MD;  [facility];  Service: Endoscopy;;   ESOPHAGOGASTRODUODENOSCOPY N/A 03/01/2017   Procedure: ESOPHAGOGASTRODUODENOSCOPY (EGD);  Surgeon: Irene Shipper, MD;  Location: [facility];  Service: Endoscopy;  Laterality: N/A;   ESOPHAGOGASTRODUODENOSCOPY (EGD) WITH ESOPHAGEAL DILATION  2010   ESOPHAGOGASTRODUODENOSCOPY (EGD) WITH PROPOFOL N/A 09/12/2018   Procedure: ESOPHAGOGASTRODUODENOSCOPY (EGD) WITH PROPOFOL;  Surgeon: Irene Shipper, MD;  [facility];  Service: Endoscopy;  Laterality: N/A;   ESOPHAGOGASTRODUODENOSCOPY (EGD) WITH PROPOFOL N/A 11/27/2018   Procedure: ESOPHAGOGASTRODUODENOSCOPY (EGD) WITH PROPOFOL, WITH BALLOON DILATION;  Surgeon: Irene Shipper, MD;  [facility];  Service: Endoscopy;  Laterality: N/A;   ESOPHAGOGASTRODUODENOSCOPY (EGD) WITH PROPOFOL N/A 02/20/2020   Procedure: ESOPHAGOGASTRODUODENOSCOPY (EGD) WITH PROPOFOL;  Surgeon: Irene Shipper, MD;  [facility];  Service: Endoscopy;  Laterality: N/A;   EXTRACORPOREAL SHOCK WAVE  LITHOTRIPSY Right 05/11/2021   Procedure: EXTRACORPOREAL SHOCK WAVE LITHOTRIPSY (ESWL);  Surgeon: [name], MD;  [facility];  Service: Urology;  Laterality: Right;   HAND SURGERY     bilateral carpal tunnel releases   JOINT REPLACEMENT     KNEE ARTHROSCOPY Right 2008   LEFT AND RIGHT HEART CATHETERIZATION WITH CORONARY ANGIOGRAM  12/31/2011   Procedure: LEFT AND RIGHT HEART CATHETERIZATION WITH CORONARY ANGIOGRAM;  Surgeon: Burnell Blanks, MD;  Location: [facility] CATH LAB;  Service: Cardiovascular;;   LEFT AND RIGHT HEART CATHETERIZATION WITH CORONARY ANGIOGRAM N/A 01/14/2014   Procedure: LEFT AND RIGHT HEART CATHETERIZATION WITH CORONARY ANGIOGRAM;  Surgeon: Peter M Martinique, MD;  Location: [facility] LAB;  Service: Cardiovascular;  Laterality: N/A;   MALONEY DILATION  03/01/2017   Procedure: Venia Minks DILATION;  Surgeon: Irene Shipper, MD;  [facility];  Service: Endoscopy;;   PERCUTANEOUS CORONARY INTERVENTION-BALLOON ONLY  01/03/2012   Procedure: PERCUTANEOUS CORONARY INTERVENTION-BALLOON ONLY;  Surgeon: Peter M Martinique, MD;  Location: [facility] CATH LAB;  Service: Cardiovascular;;   PERCUTANEOUS CORONARY STENT INTERVENTION (PCI-S)  12/31/2011   Procedure: PERCUTANEOUS CORONARY STENT INTERVENTION (PCI-S);  Surgeon: Burnell Blanks, MD;  Location: [facility] CATH LAB;  Service: Cardiovascular;;   PERCUTANEOUS CORONARY STENT INTERVENTION (PCI-S) N/A 01/03/2012   Procedure: PERCUTANEOUS CORONARY STENT INTERVENTION (PCI-S);  Surgeon: Peter M Martinique, MD;  Location: [facility] CATH LAB;  Service: Cardiovascular;  Laterality: N/A;   SHOULDER ARTHROSCOPY WITH OPEN ROTATOR CUFF REPAIR AND DISTAL CLAVICLE ACROMINECTOMY Left 02/27/2013   Procedure: LEFT SHOULDER ARTHROSCOPY WITH MINI OPEN ROTATOR CUFF REPAIR AND SUBACROMIAL DECOMPRESSION AND DISTAL CLAVICLE RESECTION;  Surgeon: [name], MD;  Location: [place];  Service: Orthopedics;  Laterality: Left;   TOTAL KNEE  ARTHROPLASTY Right 03/2010   [name]   TRANSURETHRAL RESECTION OF BLADDER TUMOR N/A 03/17/2021   Procedure: TRANSURETHRAL RESECTION OF BLADDER TUMOR (TURBT);  Surgeon: Robley Fries, MD;  [facility];  Service: Urology;  Laterality: N/A;   TRANSURETHRAL RESECTION OF BLADDER TUMOR WITH MITOMYCIN-C N/A 10/08/2020   Procedure: TRANSURETHRAL RESECTION OF BLADDER TUMOR WITH GEMCITABINE;  Surgeon: Robley Fries, MD;  [facility];  Service: Urology;  Laterality: N/A;  75 MINS   TRIGGER FINGER RELEASE Left 02/27/2013   Procedure: RELEASE TRIGGER FINGER/A-1 PULLEY;  Surgeon: [name], MD;  Location: [place];  Service: Orthopedics;  Laterality: Left;     Medications:  Outpatient Encounter Medications as of 05/22/2021  Medication Sig Note   amLODipine (NORVASC) 5 MG tablet Take 5 mg by mouth at bedtime.    aspirin EC 81 MG tablet Take 81 mg by mouth at bedtime.    Blood Glucose Monitoring Suppl (ONE TOUCH ULTRA 2) w/Device KIT Use to obtain blood sugar daily. Dx Code E11.40    cefUROXime (CEFTIN) 250 MG tablet Take 1 tablet (250 mg total) by mouth 2 (two) times daily with a meal.    clopidogrel (PLAVIX) 75 MG tablet Take 1 tablet (75 mg total) by mouth daily.    glucose blood (ONE TOUCH ULTRA TEST) test strip USE TO CHECK BLOOD SUGAR ONCE A DAY Dx Code E11.40    HYDROcodone-acetaminophen (NORCO) 10-325 MG tablet Take 1 tablet by mouth every 6 (six) hours as needed for severe pain. Must last 30 days.    Multiple Vitamins-Minerals (ICAPS AREDS 2 PO) Take 1 capsule by mouth daily.    nitroGLYCERIN (NITROSTAT) 0.4 MG SL tablet DISSOLVE 1 TABLET UNDER TONGUE AS NEEDEDFOR CHEST PAIN. MAY REPEAT 5 MINUTES APART 3 TIMES IF NEEDED. IF NO RELIEF CALL 911    ondansetron (ZOFRAN) 4 MG tablet Take 1 tablet (4 mg total) by mouth every 6 (six) hours.    OneTouch Delica Lancets 88C MISC 1 each by In Vitro route daily. Dx Code E11.49    OXYGEN Inhale 2 L into the lungs at bedtime. Per family  Uses Oxygen 2 liters sometimes during the day    pantoprazole (PROTONIX) 40 MG tablet TAKE 1 TABLET BY MOUTH TWICE (2) DAILY    polyethylene glycol (MIRALAX / GLYCOLAX) 17 g packet Take 17 g by mouth every other day.    pregabalin (LYRICA) 25 MG capsule Take 1 capsule (25 mg total) by mouth 2 (two) times daily. 05/22/2021: Per patient is has started medication    tamsulosin (FLOMAX) 0.4 MG CAPS capsule Take 0.4 mg by mouth at bedtime.    [DISCONTINUED] pregabalin (LYRICA) 50 MG capsule Take 50 mg by mouth at bedtime. (Patient not taking: Reported on 05/22/2021)    No facility-administered encounter medications on file as of 05/22/2021.    Allergies:  Allergies  Allergen Reactions   Metronidazole     Kidney Dr does not want pt to take    Doxazosin Mesylate Other (See Comments)    dizziness   Methocarbamol Rash    Family History: Family History  Problem Relation Age of Onset   COPD Mother    Heart disease Father    Heart attack Father    Stomach cancer Brother    Stroke Sister    Alcohol abuse Sister    Colon cancer Brother 76   Diabetes Brother    Rectal cancer Neg Hx     Social History: Social History   Tobacco Use   Smoking status: Former    Packs/day: 1.00    Years: 20.00    Pack years: 20.00    Types: Cigarettes    Quit date: 04/06/1963    Years since quitting: 58.1   Smokeless tobacco: Former  Scientific laboratory technician Use: Never used  Substance Use Topics   Alcohol use: No    Alcohol/week: 0.0 standard drinks    Comment: 01/01/2012 "last alcohol ~ 50 yr ago"   Drug use: No   Social History   Social History Narrative   No living will   Requests wife as health care POA--  alternate is daughter Hassan Rowan   Discussed DNR --he requests this (done 08/29/12)   Not sure about feeding tube---but might accept for some time   Patient lives with wife and daughter in a one story home.  Has 3 children.  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      Right Handed      Vital Signs:  BP (!) 159/90    Pulse (!) 102    Ht 6' (1.829 m)    Wt 182 lb (82.6 kg)    SpO2 95%    BMI 24.68 kg/m    Neurological Exam: MENTAL STATUS including orientation to time, place, person, recent and remote memory, attention span and concentration, language, and fund of knowledge is normal.  Speech is mildly dysarthric.  CRANIAL NERVES: II:  No visual field defects   III-IV-VI: Pupils equal round and reactive to light.  Normal conjugate, extra-ocular eye movements in all directions of gaze.  No nystagmus.  Mild ptosis bilaterally at rest, no worsening with sustained upgaze.   V:  Normal facial sensation.    VII:  Normal facial symmetry and movements.  Facial muscles are 5/5 with buccinator, orbicularis oculi and oris testing.  VIII:  Normal hearing and vestibular function.   IX-X:  Normal palatal movement.   XI:  Normal shoulder shrug and head rotation.   XII:  Normal tongue strength (5/5), and range of motion, no deviation or fasciculation.  MOTOR:  No atrophy, fasciculations or abnormal movements.  No pronator drift.  No fatigable weakness.  Neck flexion and extension is 5/5 Upper Extremity:  Right  Left  Deltoid  5/5   5/5   Biceps  5/5   5/5   Triceps  5/5   5/5   Wrist extensors  5/5   5/5   Wrist flexors  5/5   5/5   Finger extensors  5/5   5/5   Finger flexors  5/5   5/5   Dorsal interossei  5/5   5/5   Abductor pollicis  5/5   5/5   Tone (Ashworth scale)  0  0   Lower Extremity:  Right  Left  Hip flexors  5/5   5/5   Knee flexors  5/5   5/5   Knee extensors  5/5   5/5   Dorsiflexors  5/5   5/5   Plantarflexors  5/5   5/5   Toe extensors  5/5   5/5   Toe flexors  5/5   5/5   Tone (Ashworth scale)  0  0   MSRs:  Right        Left                  brachioradialis 2+  2+  biceps 2+  2+  triceps 2+  2+  patellar 2+  2+  ankle jerk 0  0  Hoffman no  no  plantar response down  down   SENSORY:  Vibration is absent at the feet bilaterally.   Temperature intact throughout.  There is hyperesthesia with pin prick over the feet.  COORDINATION/GAIT: Normal finger-to- nose-finger.  Gait is mildly wide-based, assisted with cane, stable.  IMPRESSION: AChR blocking antibody positivity is very borderline (28%, normal < 25%).  AChR binding and modulating which are more diagnostic are both negative. He does not have classic presentation for myasthenia which generally has diurnal fluctuation and respiratory involvement to be a late feature, often leading to crisis. His exam  shows intact bulbar and limb strength.  Although he has dyspnea, dysphagia, and ptosis which can be seen with myasthenia, his symptoms may also be explained by other medical conditions. With him having rheumatoid arthritis, it is possible that his antibody positivity is more of a systemic inflammatory response. To better determine the clinical significance of this, I suggested NCS/EMG with repetitive nerve stimulation, however, patient declined testing due to pain associated with it.  Therefore, I offered an empiric trial of mestinon which he can take 45m 1 hour prior to meals to see if this helps with dysphagia and/or dyspnea.  He was in agreement of this and will send me a MyChart message in 2 weeks to determine if he had a symptomatic response.   Total time spent reviewing records, interview, history/exam, documentation, and coordination of care on day of encounter:  60 min   Thank you for allowing me to participate in patient's care.  If I can answer any additional questions, I would be pleased to do so.    Sincerely,    Hamdan Toscano K. PPosey Pronto DO

## 2021-05-22 NOTE — Patient Instructions (Addendum)
Start mestinon 60mg  twice daily, 1 hour before meals  Please send a MyChart message in 2 weeks

## 2021-05-27 DIAGNOSIS — D220 Melanocytic nevi of lip: Secondary | ICD-10-CM | POA: Diagnosis not present

## 2021-05-27 DIAGNOSIS — D2261 Melanocytic nevi of right upper limb, including shoulder: Secondary | ICD-10-CM | POA: Diagnosis not present

## 2021-05-27 DIAGNOSIS — D2262 Melanocytic nevi of left upper limb, including shoulder: Secondary | ICD-10-CM | POA: Diagnosis not present

## 2021-05-27 DIAGNOSIS — D225 Melanocytic nevi of trunk: Secondary | ICD-10-CM | POA: Diagnosis not present

## 2021-05-27 DIAGNOSIS — L570 Actinic keratosis: Secondary | ICD-10-CM | POA: Diagnosis not present

## 2021-05-27 DIAGNOSIS — L218 Other seborrheic dermatitis: Secondary | ICD-10-CM | POA: Diagnosis not present

## 2021-06-04 ENCOUNTER — Encounter: Payer: Medicare Other | Admitting: Student in an Organized Health Care Education/Training Program

## 2021-06-05 ENCOUNTER — Encounter: Payer: Self-pay | Admitting: Neurology

## 2021-06-09 NOTE — Telephone Encounter (Signed)
Patient did not respond to a trial of mestinon and stopped it because of diarrhea.  Overall I do not think that he has myasthenia as his symptomology and presentation is not typical for neuromuscular juncture.  Very borderline AChR antibody is most likely false positive.   ?

## 2021-06-11 ENCOUNTER — Encounter: Payer: Medicare Other | Admitting: Student in an Organized Health Care Education/Training Program

## 2021-06-15 DIAGNOSIS — N390 Urinary tract infection, site not specified: Secondary | ICD-10-CM | POA: Diagnosis not present

## 2021-06-15 DIAGNOSIS — R809 Proteinuria, unspecified: Secondary | ICD-10-CM | POA: Diagnosis not present

## 2021-06-15 DIAGNOSIS — N179 Acute kidney failure, unspecified: Secondary | ICD-10-CM | POA: Diagnosis not present

## 2021-06-15 DIAGNOSIS — R0689 Other abnormalities of breathing: Secondary | ICD-10-CM | POA: Diagnosis not present

## 2021-06-15 DIAGNOSIS — N3289 Other specified disorders of bladder: Secondary | ICD-10-CM | POA: Diagnosis not present

## 2021-06-15 DIAGNOSIS — J8410 Pulmonary fibrosis, unspecified: Secondary | ICD-10-CM | POA: Diagnosis not present

## 2021-06-15 DIAGNOSIS — E1122 Type 2 diabetes mellitus with diabetic chronic kidney disease: Secondary | ICD-10-CM | POA: Diagnosis not present

## 2021-06-15 DIAGNOSIS — I2510 Atherosclerotic heart disease of native coronary artery without angina pectoris: Secondary | ICD-10-CM | POA: Diagnosis not present

## 2021-06-15 DIAGNOSIS — N184 Chronic kidney disease, stage 4 (severe): Secondary | ICD-10-CM | POA: Diagnosis not present

## 2021-06-15 DIAGNOSIS — K222 Esophageal obstruction: Secondary | ICD-10-CM | POA: Diagnosis not present

## 2021-06-15 DIAGNOSIS — I129 Hypertensive chronic kidney disease with stage 1 through stage 4 chronic kidney disease, or unspecified chronic kidney disease: Secondary | ICD-10-CM | POA: Diagnosis not present

## 2021-06-16 ENCOUNTER — Ambulatory Visit
Payer: Medicare Other | Attending: Student in an Organized Health Care Education/Training Program | Admitting: Student in an Organized Health Care Education/Training Program

## 2021-06-16 ENCOUNTER — Other Ambulatory Visit: Payer: Self-pay

## 2021-06-16 ENCOUNTER — Encounter: Payer: Self-pay | Admitting: Student in an Organized Health Care Education/Training Program

## 2021-06-16 DIAGNOSIS — M792 Neuralgia and neuritis, unspecified: Secondary | ICD-10-CM | POA: Diagnosis not present

## 2021-06-16 DIAGNOSIS — M79604 Pain in right leg: Secondary | ICD-10-CM

## 2021-06-16 DIAGNOSIS — M79605 Pain in left leg: Secondary | ICD-10-CM

## 2021-06-16 DIAGNOSIS — M47816 Spondylosis without myelopathy or radiculopathy, lumbar region: Secondary | ICD-10-CM | POA: Diagnosis not present

## 2021-06-16 DIAGNOSIS — E1142 Type 2 diabetes mellitus with diabetic polyneuropathy: Secondary | ICD-10-CM | POA: Diagnosis not present

## 2021-06-16 DIAGNOSIS — G894 Chronic pain syndrome: Secondary | ICD-10-CM

## 2021-06-16 DIAGNOSIS — G8929 Other chronic pain: Secondary | ICD-10-CM

## 2021-06-16 MED ORDER — HYDROCODONE-ACETAMINOPHEN 10-325 MG PO TABS: 1.0000 | ORAL_TABLET | Freq: Four times a day (QID) | ORAL | 0 refills | Status: AC | PRN

## 2021-06-16 MED ORDER — PREGABALIN 25 MG PO CAPS: 50.0000 mg | ORAL_CAPSULE | Freq: Two times a day (BID) | ORAL | 2 refills | Status: DC

## 2021-06-16 NOTE — Progress Notes (Signed)
Patient: [name]  Service Category: E/M  Provider: Edward Jolly, MD  DOB: 1932-01-11  DOS: 06/16/2021  Location: Office  MRN: [id number]  Setting: Ambulatory outpatient  Referring Provider: Karie Schwalbe, MD  Type: Established Patient  Specialty: Interventional Pain Management  PCP: Karie Schwalbe, MD  Location: Remote location  Delivery: TeleHealth     Virtual Encounter - Pain Management PROVIDER NOTE: Information contained herein reflects review and annotations entered in association with encounter. Interpretation of such information and data should be left to medically-trained personnel. Information provided to patient can be located elsewhere in the medical record under "Patient Instructions". Document created using STT-dictation technology, any transcriptional errors that may result from process are unintentional.    Contact & Pharmacy Preferred: [phone] Home: [phone] (home) Mobile: [phone] (mobile) E-mail: Restaurant manager, [email or web address]  [facility] - [place], [place] - [address], [address] [place] [address] Phone: [phone] Fax: [phone]  [facility] - [place], [place] - [address] [place] [address] Phone: [phone] Fax: [phone]   Pre-screening  Mr. Gagen offered "in-person" vs "virtual" encounter. He indicated preferring virtual for this encounter.   Reason COVID-19*  Social distancing based on CDC and AMA recommendations.   I contacted RYHAN PENNEY on 06/16/2021 via telephone.      I clearly identified myself as Edward Jolly, MD. I verified that I was speaking with the correct person using two identifiers (Name: STILLMAN CAHOON, and date of birth: July 05, 1931).  Consent I sought verbal advanced consent from Everrett Coombe for virtual visit interactions. I informed Mr. Pinnow of possible security and privacy concerns, risks, and limitations associated  with providing "not-in-person" medical evaluation and management services. I also informed Mr. Kokal of the availability of "in-person" appointments. Finally, I informed him that there would be a charge for the virtual visit and that he could be  personally, fully or partially, financially responsible for it. Mr. Cristina expressed understanding and agreed to proceed.   Historic Elements   Mr. CAESON GALINSKY is a 88 y.o. year old, male patient evaluated today after our last contact on 05/11/2021. Mr. Mclarney  has a past medical history of Arthritis, CAD (coronary artery disease), Cancer ([name]), Cataract, Chronic diastolic CHF (congestive heart failure) ([name]), Chronic kidney disease, stage III (moderate) ([name]), Chronic lower back pain, Colon polyps, COVID-19, Diverticulosis, Dyspnea (2009 since July -Sept), Enlarged prostate, Esophageal stricture, GERD (gastroesophageal reflux disease), Heart murmur, Hiatal hernia, History of carpal tunnel syndrome, History of kidney stones, History of PFTs, Hyperlipidemia, Hypertension, Interstitial lung disease ([name]), Iron deficiency anemia, Myocardial infarction ([name]), Nausea & vomiting, On home oxygen therapy, Osteoporosis, Overweight (BMI 25.0-29.9), Peripheral neuropathy, RA (rheumatoid arthritis) ([name]), Seropositive rheumatoid arthritis ([name]), Type II diabetes mellitus ([name]), Wears glasses, and Wears partial dentures. He also  has a past surgical history that includes Total knee arthroplasty (Right, 03/2010); Knee arthroscopy (Right, 2008); Cataract extraction w/ intraocular lens  implant, bilateral (Bilateral); Coronary artery bypass graft (11/1999); Coronary stent placement (02/2012); Shoulder arthroscopy with open rotator cuff repair and distal clavicle acrominectomy (Left, 02/27/2013); Trigger finger release (Left, 02/27/2013); Cholecystectomy open (11/2003); Joint replacement; Esophagogastroduodenoscopy (egd) with esophageal dilation (2010); Cardiac catheterization  (08/2004); Cardiac catheterization (12/31/2011); Cardiac catheterization (2009); left and right heart catheterization with coronary angiogram (12/31/2011); percutaneous coronary stent intervention (pci-s) (12/31/2011); percutaneous coronary stent intervention (pci-s) (N/A, 01/03/2012); Percutaneous coronary intervention-balloon only (01/03/2012); left and right heart catheterization with coronary angiogram (N/A, 01/14/2014); Hand surgery;  Cardiac catheterization (N/A, 08/13/2015); Esophagogastroduodenoscopy (N/A, 03/01/2017); maloney dilation (03/01/2017); Esophagogastroduodenoscopy (egd) with propofol (N/A, 09/12/2018); Botox injection (N/A, 09/12/2018); Balloon dilation (N/A, 09/12/2018); Coronary angioplasty with stent (01/03/2012); Coronary angioplasty with stent (01/14/2014); Esophagogastroduodenoscopy (egd) with propofol (N/A, 11/27/2018); Botox injection (N/A, 11/27/2018); Esophageal dilation (11/27/2018); Esophagogastroduodenoscopy (egd) with propofol (N/A, 02/20/2020); Botox injection (N/A, 02/20/2020); Balloon dilation (N/A, 02/20/2020); Transurethral resection of bladder tumor with mitomycin-c (N/A, 10/08/2020); Cystoscopy with biopsy (N/A, 03/17/2021); Transurethral resection of bladder tumor (N/A, 03/17/2021); and Extracorporeal shock wave lithotripsy (Right, 05/11/2021). Mr. Heckmann has a current medication list which includes the following prescription(s): amlodipine, aspirin ec, one touch ultra 2, clopidogrel, glucose blood, hydrocodone-acetaminophen, [START ON 07/16/2021] hydrocodone-acetaminophen, [START ON 08/15/2021] hydrocodone-acetaminophen, multiple vitamins-minerals, nitroglycerin, ondansetron, onetouch delica lancets 33g, oxygen-helium, pantoprazole, polyethylene glycol, pyridostigmine, tamsulosin, cefuroxime, fluticasone, ketoconazole, and pregabalin. He  reports that he quit smoking about 58 years ago. His smoking use included cigarettes. He has a 20.00 pack-year smoking history. He has quit using smokeless  tobacco. He reports that he does not drink alcohol and does not use drugs. Mr. Alvi is allergic to metronidazole, doxazosin mesylate, and methocarbamol.   HPI  Today, he is being contacted for medication management.  Work-up for myasthenia gravis although diagnosis is not convincing.  He has been evaluated by neurology.  Of note he also had an episode of nephrolithiasis treated with extracorporeal shockwave lithotripsy.  Otherwise, patient's pain is at baseline although he states he is having more numbness and tingling in both of his feet.  He is currently on Lyrica 25 mg twice a day and we discussed increasing his dose to 50 mg twice a day.   Patient continues multimodal pain regimen as prescribed.  States that it provides pain relief and improvement in functional status.  We are doing a virtual visit is Mr. Bendavid daughter was involved in a car accident this morning and she is unable to bring him in.  Pharmacotherapy Assessment   Opioid Analgesic: Hydrocodone 10 q6 hrs MME 40    Monitoring: [place] PMP: PDMP reviewed during this encounter.       Pharmacotherapy: No side-effects or adverse reactions reported. Compliance: No problems identified. Effectiveness: Clinically acceptable. Plan: Refer to "POC". UDS:  Summary  Date Value Ref Range Status  09/09/2020 Note  Final    Comment:    ==================================================================== [employer] Select 13 (MW) ==================================================================== Test                             Result       Flag       Units  Drug Present and Declared for Prescription Verification   Hydrocodone                    2490         EXPECTED   ng/mg creat   Hydromorphone                  2240         EXPECTED   ng/mg creat   Dihydrocodeine                 352          EXPECTED   ng/mg creat   Norhydrocodone                 1583         EXPECTED   ng/mg creat    Sources of hydrocodone include  scheduled  prescription medications.    Hydromorphone, dihydrocodeine and norhydrocodone are expected    metabolites of hydrocodone. Hydromorphone and dihydrocodeine are    also available as scheduled prescription medications.  ==================================================================== Test                      Result    Flag   Units      Ref Range   Creatinine              90               mg/dL      >=82 ==================================================================== Declared Medications:  The flagging and interpretation on this report are based on the  following declared medications.  Unexpected results may arise from  inaccuracies in the declared medications.   **Note: The testing scope of this panel includes these medications:   Hydrocodone (Norco)   **Note: The testing scope of this panel does not include the  following reported medications:   Acetaminophen (Norco)  Amlodipine (Norvasc)  Aspirin  Carvedilol (Coreg)  Cephalexin (Keflex)  Clopidogrel (Plavix)  Furosemide (Lasix)  Helium  Losartan (Cozaar)  Lutein  Nitroglycerin (Nitrostat)  Omega-3 Fatty Acids  Oxygen  Pantoprazole (Protonix)  Polyethylene Glycol (MiraLAX)  Pregabalin (Lyrica)  Semaglutide (Rybelsus)  Tamsulosin (Flomax)  Vitamin C ==================================================================== For clinical consultation, please call [phone]. ====================================================================      Laboratory Chemistry Profile   Renal Lab Results  Component Value Date   BUN 42 (H) 04/07/2021   CREATININE 2.54 (H) 04/07/2021   BCR 13 06/24/2017   GFR 29.52 (L) 10/29/2020   GFRAA 30 (L) 06/24/2017   GFRNONAA 24 (L) 04/07/2021    Hepatic Lab Results  Component Value Date   AST 18 04/07/2021   ALT 13 04/07/2021   ALBUMIN 3.6 04/07/2021   ALKPHOS 90 04/07/2021   LIPASE 32 01/27/2021    Electrolytes Lab Results  Component Value Date   NA 136 04/07/2021    K 4.7 04/07/2021   CL 99 04/07/2021   CALCIUM 10.1 04/07/2021   MG 2.1 04/12/2020   PHOS 2.9 06/13/2019    Bone No results found for: VD25OH, VD125OH2TOT, HQ4696EX5, MW4132GM0, 25OHVITD1, 25OHVITD2, 25OHVITD3, TESTOFREE, TESTOSTERONE  Inflammation (CRP: Acute Phase) (ESR: Chronic Phase) Lab Results  Component Value Date   ESRSEDRATE 38 (H) 11/22/2011   LATICACIDVEN 1.6 01/28/2021         Note: Above Lab results reviewed.  Imaging  DG Abd 1 View CLINICAL DATA:  Renal stone  EXAM: ABDOMEN - 1 VIEW  COMPARISON:  CT done on 04/07/2021 and radiographs done on 04/23/2021  FINDINGS: Bowel gas pattern is nonspecific. Moderate amount of stool is seen in the colon. Kidneys are partly obscured by bowel contents limiting evaluation for renal stones. There is 8 mm calcific density in the right side of pelvis in the distal course of right ureter.  IMPRESSION: There is 8 mm calcification in the distal course of right ureter, possibly ureteral stone.  Electronically Signed   By: Ernie Avena M.D.   On: 05/11/2021 16:41  Assessment  The primary encounter diagnosis was Lumbar facet arthropathy. Diagnoses of Lumbar spondylosis, Neuropathic pain, Diabetic polyneuropathy associated with type 2 diabetes mellitus ([name]), Chronic pain of both lower extremities, and Chronic pain syndrome were also pertinent to this visit.  Plan of Care   Mr. ODYSSEUS BOULWARE has a current medication list which includes the following long-term medication(s): nitroglycerin, pantoprazole, pyridostigmine, and pregabalin.  Pharmacotherapy (  Medications Ordered): Meds ordered this encounter  Medications   HYDROcodone-acetaminophen (NORCO) 10-325 MG tablet    Sig: Take 1 tablet by mouth every 6 (six) hours as needed for severe pain. Must last 30 days.    Dispense:  120 tablet    Refill:  0    Chronic Pain: STOP Act (Not applicable) Fill 1 day early if closed on refill date. Avoid benzodiazepines within  8 hours of opioids   HYDROcodone-acetaminophen (NORCO) 10-325 MG tablet    Sig: Take 1 tablet by mouth every 6 (six) hours as needed for severe pain. Must last 30 days.    Dispense:  120 tablet    Refill:  0    Chronic Pain: STOP Act (Not applicable) Fill 1 day early if closed on refill date. Avoid benzodiazepines within 8 hours of opioids   HYDROcodone-acetaminophen (NORCO) 10-325 MG tablet    Sig: Take 1 tablet by mouth every 6 (six) hours as needed for severe pain. Must last 30 days.    Dispense:  120 tablet    Refill:  0    Chronic Pain: STOP Act (Not applicable) Fill 1 day early if closed on refill date. Avoid benzodiazepines within 8 hours of opioids   pregabalin (LYRICA) 25 MG capsule    Sig: Take 2 capsules (50 mg total) by mouth 2 (two) times daily.    Dispense:  120 capsule    Refill:  2    Fill one day early if pharmacy is closed on scheduled refill date. May substitute for generic if available.    Follow-up plan:   Return in about 3 months (around 09/16/2021) for Medication Management, in person (UDS).      Recent Visits No visits were found meeting these conditions. Showing recent visits within past 90 days and meeting all other requirements Today's Visits Date Type Provider Dept  06/16/21 Office Visit Edward Jolly, MD [facility]  Showing today's visits and meeting all other requirements Future Appointments No visits were found meeting these conditions. Showing future appointments within next 90 days and meeting all other requirements  I discussed the assessment and treatment plan with the patient. The patient was provided an opportunity to ask questions and all were answered. The patient agreed with the plan and demonstrated an understanding of the instructions.  Patient advised to call back or seek an in-person evaluation if the symptoms or condition worsens.  Duration of encounter: .  Note by: Edward Jolly, MD Date: 06/16/2021; Time: 11:05  AM

## 2021-06-17 ENCOUNTER — Other Ambulatory Visit: Payer: Self-pay | Admitting: Internal Medicine

## 2021-06-17 DIAGNOSIS — N201 Calculus of ureter: Secondary | ICD-10-CM | POA: Diagnosis not present

## 2021-06-17 DIAGNOSIS — R300 Dysuria: Secondary | ICD-10-CM | POA: Diagnosis not present

## 2021-06-17 DIAGNOSIS — R8279 Other abnormal findings on microbiological examination of urine: Secondary | ICD-10-CM | POA: Diagnosis not present

## 2021-06-24 DIAGNOSIS — N201 Calculus of ureter: Secondary | ICD-10-CM | POA: Diagnosis not present

## 2021-06-25 ENCOUNTER — Other Ambulatory Visit: Payer: Self-pay | Admitting: Urology

## 2021-06-26 NOTE — Progress Notes (Addendum)
Anesthesia Review: ? ?PCP: DR Viviana Simpler- LOV 05/08/21  ?Cardiologist : DR Jonell Cluck- LOV with Margaret Pyle on 03/04/21  ?Neuro- 05/22/21- LOV with Narda Amber  ?Pulm- LOV with Daine Gravel on 03/25/21  ?Chest x-ray : 01/27/21-1v  ?03/04/21-CT Chest  ?EKG : 01/29/21  ?Echo :01/29/21  ?Stress test: ?Cardiac Cath :  2017  ?Activity level:  ?Sleep Study/ CPAP : ?Fasting Blood Sugar :      / Checks Blood Sugar -- times a day:   ?Blood Thinner/ Instructions /Last Dose: ?ASA / Instructions/ Last Dose :   ?81 mg Aspirin ?Plavix  ?Diet controlled type 2 DM  ?Hgba1c- 06/29/21-  ?05/11/21- Lithotripsy  ?

## 2021-06-26 NOTE — Progress Notes (Signed)
DUE TO COVID-19 ONLY ONE VISITOR IS ALLOWED TO COME WITH YOU AND STAY IN THE WAITING ROOM ONLY DURING PRE OP AND PROCEDURE DAY OF SURGERY.  2 VISITOR  MAY VISIT WITH YOU AFTER SURGERY IN YOUR PRIVATE ROOM DURING VISITING HOURS ONLY! ?YOU MAY HAVE ONE PERSON SPEND THE NITE WITH YOU IN YOUR ROOM AFTER SURGERY.   ? ? Your procedure is scheduled on:  ?      06/30/21  ? Report to [facility] Main  Entrance ? ? Report to admitting at      0730am           AM ?DO NOT BRING INSURANCE CARD, PICTURE ID OR WALLET DAY OF SURGERY.  ?  ? ? Call this number if you have problems the morning of surgery [phone]  ? ? REMEMBER: NO  SOLID FOODS , CANDY, GUM OR MINTS AFTER MIDNITE THE NITE BEFORE SURGERY .       [name] CLEAR LIQUIDS UNTIL       0645am          DAY OF SURGERY.      ? ? ?CLEAR LIQUID DIET ? ? ?Foods Allowed      ?WATER ?BLACK COFFEE ( SUGAR OK, NO MILK, CREAM OR CREAMER) REGULAR AND DECAF  ?TEA ( SUGAR OK NO MILK, CREAM, OR CREAMER) REGULAR AND DECAF  ?PLAIN JELLO ( NO RED)  ?FRUIT ICES ( NO RED, NO FRUIT PULP)  ?POPSICLES ( NO RED)  ?JUICE- APPLE, WHITE GRAPE AND WHITE CRANBERRY  ?SPORT DRINK LIKE GATORADE ( NO RED)  ?CLEAR BROTH ( VEGETABLE , CHICKEN OR BEEF)                                                               ? ?    ? ?BRUSH YOUR TEETH MORNING OF SURGERY AND RINSE YOUR MOUTH OUT, NO CHEWING GUM CANDY OR MINTS. ?  ? ? Take these medicines the morning of surgery with A SIP OF WATER: bprotonix, lyrica, mestinon  ? ? ?DO NOT TAKE ANY DIABETIC MEDICATIONS DAY OF YOUR SURGERY ?                  ?            You may not have any metal on your body including hair pins and  ?            piercings  Do not wear jewelry, make-up, lotions, powders or perfumes, deodorant ?            Do not wear nail polish on your fingernails.   ?           IF YOU ARE A MALE AND WANT TO SHAVE UNDER ARMS OR LEGS PRIOR TO SURGERY YOU MUST DO SO AT LEAST 48 HOURS PRIOR TO SURGERY.  ?            Men may shave face and neck. ? ? Do  not bring valuables to the hospital. [place] NOT ?            RESPONSIBLE   FOR VALUABLES. ? Contacts, dentures or bridgework may not be worn into surgery. ? Leave suitcase in the car. After surgery it may be brought to your room. ? ?  ?  Patients discharged the day of surgery will not be allowed to drive home. IF YOU ARE HAVING SURGERY AND GOING HOME THE SAME DAY, YOU MUST HAVE AN ADULT TO DRIVE YOU HOME AND BE WITH YOU FOR 24 HOURS. YOU MAY GO HOME BY TAXI OR UBER OR ORTHERWISE, BUT AN ADULT MUST ACCOMPANY YOU HOME AND STAY WITH YOU FOR 24 HOURS. ?  ? ?            Please read over the following fact sheets you were given: ?_____________________________________________________________________ ? ?[place] - Preparing for Surgery ?Before surgery, you can play an important role.  Because skin is not sterile, your skin needs to be as free of germs as possible.  You can reduce the number of germs on your skin by washing with CHG (chlorahexidine gluconate) soap before surgery.  CHG is an antiseptic cleaner which kills germs and bonds with the skin to continue killing germs even after washing. ?Please DO NOT use if you have an allergy to CHG or antibacterial soaps.  If your skin becomes reddened/irritated stop using the CHG and inform your nurse when you arrive at Short Stay. ?Do not shave (including legs and underarms) for at least 48 hours prior to the first CHG shower.  You may shave your face/neck. ?Please follow these instructions carefully: ? 1.  Shower with CHG Soap the night before surgery and the  morning of Surgery. ? 2.  If you choose to wash your hair, wash your hair first as usual with your  normal  shampoo. ? 3.  After you shampoo, rinse your hair and body thoroughly to remove the  shampoo.                           4.  Use CHG as you would any other liquid soap.  You can apply chg directly  to the skin and wash  ?                     Gently with a scrungie or clean washcloth. ? 5.  Apply the CHG  Soap to your body ONLY FROM THE NECK DOWN.   Do not use on face/ open      ?                     Wound or open sores. Avoid contact with eyes, ears mouth and genitals (private parts).  ?                     Production manager,  Genitals (private parts) with your normal soap. ?            6.  Wash thoroughly, paying special attention to the area where your surgery  will be performed. ? 7.  Thoroughly rinse your body with warm water from the neck down. ? 8.  DO NOT shower/wash with your normal soap after using and rinsing off  the CHG Soap. ?               9.  Pat yourself dry with a clean towel. ?           10.  Wear clean pajamas. ?           11.  Place clean sheets on your bed the night of your first shower and do not  sleep with pets. ?Day of Surgery : ?Do not apply any lotions/deodorants the  morning of surgery.  Please wear clean clothes to the hospital/surgery center. ? ?FAILURE TO FOLLOW THESE INSTRUCTIONS MAY RESULT IN THE CANCELLATION OF YOUR SURGERY ?PATIENT SIGNATURE_________________________________ ? ?NURSE SIGNATURE__________________________________ ? ?________________________________________________________________________  ? ? ?           ?

## 2021-06-29 ENCOUNTER — Encounter (HOSPITAL_COMMUNITY): Payer: Self-pay | Admitting: Certified Registered Nurse Anesthetist

## 2021-06-29 ENCOUNTER — Encounter (HOSPITAL_COMMUNITY)
Admission: RE | Admit: 2021-06-29 | Discharge: 2021-06-29 | Disposition: A | Discharge status: Home / Self Care | Payer: Medicare Other | Source: Ambulatory Visit | Attending: Anesthesiology | Admitting: Anesthesiology

## 2021-06-29 DIAGNOSIS — E1142 Type 2 diabetes mellitus with diabetic polyneuropathy: Secondary | ICD-10-CM

## 2021-06-29 NOTE — Progress Notes (Signed)
Preop nurse called and spoke with daughter on 06/29/21 this am.  Daughter verified that pt was cancelling surgery and she had called [facility] . PT to go to see PCP on 06/30/21 per daughter.   ?

## 2021-06-29 NOTE — Progress Notes (Signed)
Called and LVMM for Selita on 06/29/21 atam in regards to pt calle dnad staeted he was sick and rescheduling procedure andwas not coming in for preop appt. Hassan Rowan, Scheduler also aware.   ?

## 2021-06-30 ENCOUNTER — Telehealth: Payer: Self-pay | Admitting: *Deleted

## 2021-06-30 ENCOUNTER — Encounter (HOSPITAL_COMMUNITY): Admission: RE | Payer: Self-pay | Source: Home / Self Care

## 2021-06-30 ENCOUNTER — Ambulatory Visit: Payer: Medicare Other | Admitting: Family

## 2021-06-30 ENCOUNTER — Ambulatory Visit (HOSPITAL_COMMUNITY): Admission: RE | Admit: 2021-06-30 | Payer: Medicare Other | Source: Home / Self Care | Admitting: Urology

## 2021-06-30 ENCOUNTER — Ambulatory Visit: Payer: Medicare Other | Admitting: Internal Medicine

## 2021-06-30 SURGERY — CYSTOURETEROSCOPY, WITH RETROGRADE PYELOGRAM AND STENT INSERTION
Anesthesia: General | Laterality: Right

## 2021-06-30 NOTE — Telephone Encounter (Signed)
Spoke to patient's daughter and she agreed that her dad should see Dr. Silvio Pate. Appointment today with Eugenia Pancoast NP cancelled. Patient scheduled to see Dr. Silvio Pate tomorrow 07/01/21 at 9:45 am. Patient's daughter was given ER precautions and verbalized understanding. [name] stated if she is not able to bring her dad tomorrow for his appointment she will have her sister bring him. ?

## 2021-06-30 NOTE — Telephone Encounter (Signed)
Patient scheduled to see Eugenia Pancoast NP today at 2:20 pm for nausea. Tabitha requested that patient be called and triaged.  ?Called and spoke to patient's daughter [name] and was advised that her dad has had nausea off and on for at least two weeks. [name] stated that her dad is urinating as usual. [name] stated that she thinks that he is having nausea because he is taking his pain medication on an empty stomach. [name] stated that her dad was scheduled for surgery but cancelled it because he was feeling so bad. [name] stated that his urologist advised her dad to see his PCP to make sure that nothing else is going on with him. ?After speaking to Dr. Silvio Pate it was advised that patient should probably see him since he is very complicated. ?Called and left a message on voicemail for [name] to call the office back to change her dad's appointment. ?

## 2021-06-30 NOTE — Telephone Encounter (Signed)
It definitely makes sense to change the appt to me ?

## 2021-07-01 ENCOUNTER — Other Ambulatory Visit: Payer: Self-pay

## 2021-07-01 ENCOUNTER — Ambulatory Visit (INDEPENDENT_AMBULATORY_CARE_PROVIDER_SITE_OTHER): Payer: Medicare Other | Admitting: Internal Medicine

## 2021-07-01 ENCOUNTER — Encounter: Payer: Self-pay | Admitting: Internal Medicine

## 2021-07-01 DIAGNOSIS — M79604 Pain in right leg: Secondary | ICD-10-CM

## 2021-07-01 DIAGNOSIS — R42 Dizziness and giddiness: Secondary | ICD-10-CM | POA: Diagnosis not present

## 2021-07-01 DIAGNOSIS — R1310 Dysphagia, unspecified: Secondary | ICD-10-CM

## 2021-07-01 DIAGNOSIS — M79605 Pain in left leg: Secondary | ICD-10-CM

## 2021-07-01 DIAGNOSIS — G8929 Other chronic pain: Secondary | ICD-10-CM

## 2021-07-01 MED ORDER — POLYETHYLENE GLYCOL 3350 17 G PO PACK: 17.0000 g | PACK | ORAL | 0 refills | Status: DC

## 2021-07-01 NOTE — Assessment & Plan Note (Signed)
Speech slightly slurred and daughter concerned the narcotics are too much ?Discussed using more norco more prn based--not 1 every 6 hours ?

## 2021-07-01 NOTE — Progress Notes (Signed)
? ?Subjective:  ? ? Patient ID: [name], male    DOB: [birth year], 88 y.o.   MRN: [id number] ? ?HPI ?Here due to nausea and dizziness ?With daughter and wife here ? ?Had another kidney stone ?Had lithotripsy last month ?Was due for follow up yesterday--but couldn't make it due to feeling sick ?Planned repeat cysto to recheck bladder also ? ?Has had nausea--no vomiting ?"Swimmy headed, dizzy, off balance" ?The dizziness has gone on a week or so ?Very weak ? ?Went back on the lasix 40 three times a week --started a few weeks ago at the direction of Dr Shelda Altes ? ?Had increased the hydrocodone per Dr [name] ?Went up to 2 bid--but told that was too much for his stomach when at [employer] ?Now cut back to 1 every 6 hours ? ?No chest pain ?Same dyspnea--no change ? ?Current Outpatient Medications on File Prior to Visit  ?Medication Sig Dispense Refill  ? amLODipine (NORVASC) 5 MG tablet Take 5 mg by mouth at bedtime.    ? aspirin EC 81 MG tablet Take 81 mg by mouth at bedtime.    ? Blood Glucose Monitoring Suppl (ONE TOUCH ULTRA 2) w/Device KIT Use to obtain blood sugar daily. Dx Code E11.40 1 kit 0  ? carvedilol (COREG) 6.25 MG tablet Take 6.25 mg by mouth 2 (two) times daily with a meal.    ? cefUROXime (CEFTIN) 250 MG tablet Take 1 tablet (250 mg total) by mouth 2 (two) times daily with a meal. 6 tablet 0  ? clopidogrel (PLAVIX) 75 MG tablet Take 1 tablet (75 mg total) by mouth daily. 90 tablet 3  ? doxycycline (VIBRA-TABS) 100 MG tablet Take 100 mg by mouth 2 (two) times daily.    ? finasteride (PROSCAR) 5 MG tablet Take 5 mg by mouth daily.    ? fluticasone (CUTIVATE) 0.05 % cream Apply 1 application. topically daily.    ? furosemide (LASIX) 40 MG tablet Take 40 mg by mouth every Monday, Wednesday, and Friday.    ? glucose blood (ONE TOUCH ULTRA TEST) test strip USE TO CHECK BLOOD SUGAR ONCE A DAY Dx Code E11.40 100 each 3  ? HYDROcodone-acetaminophen (NORCO) 10-325 MG tablet Take 1 tablet by mouth  every 6 (six) hours as needed for severe pain. Must last 30 days. (Patient taking differently: Take 2 tablets by mouth in the morning and at bedtime. Must last 30 days.) 120 tablet 0  ? [START ON 07/16/2021] HYDROcodone-acetaminophen (NORCO) 10-325 MG tablet Take 1 tablet by mouth every 6 (six) hours as needed for severe pain. Must last 30 days. 120 tablet 0  ? [START ON 08/15/2021] HYDROcodone-acetaminophen (NORCO) 10-325 MG tablet Take 1 tablet by mouth every 6 (six) hours as needed for severe pain. Must last 30 days. 120 tablet 0  ? ketoconazole (NIZORAL) 2 % shampoo Apply 1 application. topically 2 (two) times a week.    ? losartan (COZAAR) 25 MG tablet Take 25 mg by mouth daily.    ? Multiple Vitamins-Minerals (ICAPS AREDS 2 PO) Take 1 capsule by mouth daily.    ? nitroGLYCERIN (NITROSTAT) 0.4 MG SL tablet DISSOLVE 1 TABLET UNDER TONGUE AS NEEDEDFOR CHEST PAIN. MAY REPEAT 5 MINUTES APART 3 TIMES IF NEEDED. IF NO RELIEF CALL 911 25 tablet 0  ? ondansetron (ZOFRAN) 4 MG tablet Take 1 tablet (4 mg total) by mouth every 6 (six) hours. (Patient taking differently: Take 4 mg by mouth every 6 (six) hours as needed for nausea or  vomiting.) 12 tablet 0  ? OneTouch Delica Lancets 34L MISC 1 each by In Vitro route daily. Dx Code E11.49 100 each 3  ? OXYGEN Inhale 2 L into the lungs at bedtime. May use in the day time if he gets "winded"    ? pantoprazole (PROTONIX) 40 MG tablet TAKE 1 TABLET BY MOUTH TWICE (2) DAILY 180 tablet 3  ? pregabalin (LYRICA) 25 MG capsule Take 2 capsules (50 mg total) by mouth 2 (two) times daily. (Patient taking differently: Take 25 mg by mouth 2 (two) times daily.) 120 capsule 2  ? pyridostigmine (MESTINON) 60 MG tablet Take 1 tablet (60 mg total) by mouth in the morning and at bedtime. 30 tablet 1  ? tamsulosin (FLOMAX) 0.4 MG CAPS capsule Take 0.4 mg by mouth at bedtime.    ? ?No current facility-administered medications on file prior to visit.  ? ? ?Allergies  ?Allergen Reactions  ?  Metronidazole   ?  Kidney Dr does not want pt to take   ? Doxazosin Mesylate Other (See Comments)  ?  dizziness  ? Methocarbamol Rash  ? ? ?Past Medical History:  ?Diagnosis Date  ? Arthritis   ? osteoarthritis, s/p R TKR, and digits  ? CAD (coronary artery disease)   ? a. s/p CABG (2001)  b. s/p DES to RCA and cutting POBA to ostial PDA (2013)   c. s/p DES to SVG to OM2 (01/14/14) d. cath: 08/2015 NSTEMI w/ patent LIMA-LAD and 99% stenosis of SVG-OM w/ DES placed. CTO of SVG-RCA and SVG-D1.   ? Cancer [facility])   ? Cataract   ? Chronic diastolic CHF (congestive heart failure) ([place])   ? a) 09/13 ECHO- LVEF 93-79%, grade 1 diastolic dysfunction, mild LA dilatation, atrial septal aneurysm, AV mobility restricted, but no sig AS by doppler; b) 09/04/08 ECHO- LVH, ef 60%, mild AS, c. echo 08/2015: EF perserved of 55-60% with inferolateral HK. Mild AS noted.  ? Chronic kidney disease, stage III (moderate) ([name])   ? Chronic lower back pain   ? Colon polyps   ? COVID-19   ? Diverticulosis   ? Dyspnea 2009 since July -Sept  ? 05/06/08-CPST-  normal effort, reduced VO2 max 20.5 /65%, reduced at 8.2/ 40%, normal breathing resetvca of 55%, submaximal heart rate response 112/77%, flattened o2 pluse response at peak exercise-12 ml/beat @ 85%, No VQ mismatch abnormalities, All c/w CIRC Limitation  ? Enlarged prostate   ? Esophageal stricture   ? a. s/p dilation spring 2010  ? GERD (gastroesophageal reflux disease)   ? Heart murmur   ? Hiatal hernia   ? History of carpal tunnel syndrome   ? Bilateral  ? History of kidney stones   ? History of PFTs   ? mixed pattern on spiro. mild restn on lung volumes with near normal DLCO. Pattern can be explained by CABG scar. Fev1 2.2L/73%, ratio 68 (67), TLC 4.7/68%,RV 1.5L/55%,DLCO 79%  ? Hyperlipidemia   ? Hypertension   ? Interstitial lung disease ([place])   ? NOS  ? Iron deficiency anemia   ? Myocardial infarction [facility])   ? Nausea & vomiting   ? 2018/2019  ? On home oxygen therapy   ? 2 L [place] at  bedtime  ? Osteoporosis   ? Overweight (BMI 25.0-29.9)   ? BMI 29  ? Peripheral neuropathy   ? RA (rheumatoid arthritis) ([place])   ? [name]  ? Seropositive rheumatoid arthritis ([place])   ? Type II diabetes mellitus (  [place])   ? diet controlled  ? Wears glasses   ? Wears partial dentures   ? upper  ? ? ?Past Surgical History:  ?Procedure Laterality Date  ? BALLOON DILATION N/A 09/12/2018  ? Procedure: BALLOON DILATION;  Surgeon: Irene Shipper, MD;  Location: [facility];  Service: Endoscopy;  Laterality: N/A;  ? BALLOON DILATION N/A 02/20/2020  ? Procedure: BALLOON DILATION;  Surgeon: Irene Shipper, MD;  Location: [facility];  Service: Endoscopy;  Laterality: N/A;  ? BOTOX INJECTION N/A 09/12/2018  ? Procedure: BOTOX INJECTION;  Surgeon: Irene Shipper, MD;  Location: [facility];  Service: Endoscopy;  Laterality: N/A;  ? BOTOX INJECTION N/A 11/27/2018  ? Procedure: BOTOX INJECTION;  Surgeon: Irene Shipper, MD;  Location: [facility];  Service: Endoscopy;  Laterality: N/A;  ? BOTOX INJECTION N/A 02/20/2020  ? Procedure: BOTOX INJECTION;  Surgeon: Irene Shipper, MD;  Location: [facility];  Service: Endoscopy;  Laterality: N/A;  ? CARDIAC CATHETERIZATION  08/2004  ? CP- no MI, Cath- small vessell disease   ? CARDIAC CATHETERIZATION  12/31/2011  ? 80% distal LM, 100% native LAD, LCx and RCA, 30% prox SVG-OM, SVG-D1 normal, 99% distal, 80% ostial SVG-RCA distal to graft, LIMA-LAD normal; LVEF mildly decreased with posterior basal AK   ? CARDIAC CATHETERIZATION  2009  ? with patent grafts/notes 12/31/2011  ? CARDIAC CATHETERIZATION N/A 08/13/2015  ? Procedure: Left Heart Cath and Cors/Grafts Angiography;  Surgeon: Sherren Mocha, MD;  Location: [facility];  Service: Cardiovascular;  Laterality: N/A;  ? CATARACT EXTRACTION W/ INTRAOCULAR LENS  IMPLANT, BILATERAL Bilateral   ? CHOLECYSTECTOMY OPEN  11/2003  ? [name]  ? CORONARY ANGIOPLASTY WITH STENT PLACEMENT  01/03/2012  ? Successful DES to SVG-RCA and cutting balloon  angioplasty ostial  PDA   ? CORONARY ANGIOPLASTY WITH STENT PLACEMENT  01/14/2014  ? "1"  ? CORONARY ARTERY BYPASS GRAFT  11/1999  ? CABG X5  ? CORONARY STENT PLACEMENT  02/2012  ? 1 stent and balloon  ? CYSTOSCOPY [place]

## 2021-07-01 NOTE — Patient Instructions (Signed)
Please take the furosemide only if your weight is over 185# at home. ?Don't take the hydrocodone routinely---only take it if you are having significant pain. ?

## 2021-07-01 NOTE — Assessment & Plan Note (Signed)
I think this is mostly prerenal ?Asked him again to change the furosemide to weight based---take '40mg'$  only if his home weight is over 185# ?

## 2021-07-01 NOTE — Assessment & Plan Note (Signed)
Some ongoing dysphagia despite pantoprazole 40 bid ?Plans to go back to Dr Henrene Pastor ? ?Nausea not clearly GI ?I think it may be more related to the orthostasis and perhaps the narcotics ?

## 2021-07-03 ENCOUNTER — Telehealth: Payer: Self-pay

## 2021-07-03 NOTE — Telephone Encounter (Signed)
Patient stats that he was told to go on low sodium diet by a doctor not sure who it was. Wanted to know if you could tell him how much sodium he needs to stay below a day.  ?

## 2021-07-06 ENCOUNTER — Encounter: Payer: Self-pay | Admitting: Physician Assistant

## 2021-07-06 ENCOUNTER — Ambulatory Visit: Payer: Medicare Other | Admitting: Physician Assistant

## 2021-07-06 ENCOUNTER — Telehealth: Payer: Self-pay

## 2021-07-06 VITALS — BP 102/72 | HR 94 | Ht 72.0 in | Wt 176.4 lb

## 2021-07-06 DIAGNOSIS — R1310 Dysphagia, unspecified: Secondary | ICD-10-CM | POA: Diagnosis not present

## 2021-07-06 DIAGNOSIS — R634 Abnormal weight loss: Secondary | ICD-10-CM

## 2021-07-06 DIAGNOSIS — K220 Achalasia of cardia: Secondary | ICD-10-CM | POA: Diagnosis not present

## 2021-07-06 DIAGNOSIS — K224 Dyskinesia of esophagus: Secondary | ICD-10-CM

## 2021-07-06 NOTE — Telephone Encounter (Signed)
? ? ?  Patient Name: [name]  ?DOB: Nov 04, 1931 ?MRN: [id number] ? ?Primary Cardiologist: [name], MD ? ?Chart reviewed as part of pre-operative protocol coverage. Given past medical history and time since last visit, based on ACC/AHA guidelines, [name] would be at acceptable risk for the planned procedure without further cardiovascular testing.  ? ?I spoke with the patient's daughter, he has not had any recent cardiac complaints.  He previously was cleared for a different procedure in January.  His daughter has been instructed to hold his Plavix for 5 days prior to his upcoming GI procedure.  We deferred to the surgeon's office to decide at the earliest time to restart the Plavix after the procedure. ? ?The patient was advised that if he develops new symptoms prior to surgery to contact our office to arrange for a follow-up visit, and he verbalized understanding. ? ?I will route this recommendation to the requesting party via Epic fax function and remove from pre-op pool. ? ?Please call with questions. ? ?Almyra Deforest, [place] ?07/06/2021, 5:33 PM ? ?

## 2021-07-06 NOTE — Telephone Encounter (Signed)
 [facility] Pre-operative Risk Assessment  ?   ?Request for surgical clearance:     Endoscopy Procedure ? ?What type of surgery is being performed?     Endoscopy ? ?When is this surgery scheduled?     08/24/2021 ? ?What type of clearance is required ?   Pharmacy ? ?Are there any medications that need to be held prior to surgery and how long? Plavix starting 5 days prior ? ?Practice name and name of physician performing surgery?      [facility] ? ?What is your office phone and fax number?      Phone- [phone]  Fax- [phone] ? ?Anesthesia type (None, local, MAC, general) ?       [facility] ? ?

## 2021-07-06 NOTE — Telephone Encounter (Signed)
Left message for pt's daughter, [name]. Read Dr Alla German note on message.  ?

## 2021-07-06 NOTE — Patient Instructions (Signed)
If you are age 86 or older, your body mass index should be between 23-30. Your Body mass index is 23.92 kg/m?[name] If this is out of the aforementioned range listed, please consider follow up with your Primary Care Provider. ?________________________________________________________ ? ?The [facility] providers would like to encourage you to use [facility] to communicate with providers for non-urgent requests or questions.  Due to long hold times on the telephone, sending your provider a message by [facility] may be a faster and more efficient way to get a response.  Please allow 48 business hours for a response.  Please remember that this is for non-urgent requests.  ?_______________________________________________________ ? ?You have been scheduled for an endoscopy. Please follow written instructions given to you at your visit today. ?If you use inhalers (even only as needed), please bring them with you on the day of your procedure. ? ?You will be contacted by our office prior to your procedure for directions on holding your Plavix.  If you do not hear from our office 1 week prior to your scheduled procedure, please call [phone] to discuss.  ? ?Thank you for entrusting me with your care and choosing [facility]. ? ?Nicoletta Ba, PA-C ?

## 2021-07-06 NOTE — Progress Notes (Signed)
? ?Subjective:  ? ? Patient ID: [name], male    DOB: 09/21/1931, 88 y.o.   MRN: [id number] ? ?HPI ?[name] is a pleasant 88 year old white male, established with [name], who was last seen at the time of EGD in November 2021. ?He comes in today with complaints of recurrent dysphagia to liquids and solids over the past 2 to 3 months, somewhat progressive.  He says sometimes water will cause more problems than anything.  He has been having frequent episodes of regurgitation. ?Also generally having difficulty with solid foods, bread and meat.  No complaints of heartburn or indigestion.  He has lost some weight over the past couple of months. ? ?He has diagnosis of probable achalasia/chronic dysmotility.  At the time of last EGD in November 2021 he was noted to have a mild stricture at the GE junction, he was injected with Botox in 4 quadrants then underwent TTS dilation from 18 to 20 mm, also noted to have a small hiatal hernia. ?He and his daughter both said that he had very good response to that and did not have any trouble with dysphagia until over the past few months. ?He had also had EGD in 2020 with Botox and dilation here, and EGD with dilation and Botox at [facility] in 2019. ?Patient has history of iron deficiency anemia secondary to small bowel AVMs. ?He is on chronic Plavix with history of coronary artery disease.he is status post CABG, and 3 drug-eluting stents, the last was done in 2015 ,history of sleep apnea, interstitial lung disease with chronic respiratory failure requiring oxygen at 2 L nasal cannula at nighttime.  Also with history of aortic stenosis, last echo October 2022 EF 60 to 65%, mean aortic valve gradient 14 mmHg entheses mild to moderate) AVA 1.35 cm? ? ?Hx hypertension, rheumatoid arthritis, chronic kidney disease stage IV.  Fairly recent diagnosis of bladder cancer with resection, and is being worked up for a kidney stone which will need to be removed.. ? ? ?Review of Systems.Pertinent  positive and negative review of systems were noted in the above HPI section.  All other review of systems was otherwise negative.  ? ?Outpatient Encounter Medications as of 07/06/2021  ?Medication Sig  ? amLODipine (NORVASC) 5 MG tablet Take 5 mg by mouth at bedtime.  ? aspirin EC 81 MG tablet Take 81 mg by mouth at bedtime.  ? Blood Glucose Monitoring Suppl (ONE TOUCH ULTRA 2) w/Device KIT Use to obtain blood sugar daily. Dx Code E11.40  ? carvedilol (COREG) 6.25 MG tablet Take 6.25 mg by mouth 2 (two) times daily with a meal.  ? cefUROXime (CEFTIN) 250 MG tablet Take 1 tablet (250 mg total) by mouth 2 (two) times daily with a meal.  ? clopidogrel (PLAVIX) 75 MG tablet Take 1 tablet (75 mg total) by mouth daily.  ? finasteride (PROSCAR) 5 MG tablet Take 5 mg by mouth daily.  ? fluticasone (CUTIVATE) 0.05 % cream Apply 1 application. topically daily.  ? glucose blood (ONE TOUCH ULTRA TEST) test strip USE TO CHECK BLOOD SUGAR ONCE A DAY Dx Code E11.40  ? HYDROcodone-acetaminophen (NORCO) 10-325 MG tablet Take 1 tablet by mouth every 6 (six) hours as needed for severe pain. Must last 30 days. (Patient taking differently: Take 2 tablets by mouth in the morning and at bedtime. Must last 30 days.)  ? [START ON 07/16/2021] HYDROcodone-acetaminophen (NORCO) 10-325 MG tablet Take 1 tablet by mouth every 6 (six) hours as needed for severe pain.  Must last 30 days.  ? [START ON 08/15/2021] HYDROcodone-acetaminophen (NORCO) 10-325 MG tablet Take 1 tablet by mouth every 6 (six) hours as needed for severe pain. Must last 30 days.  ? ketoconazole (NIZORAL) 2 % shampoo Apply 1 application. topically 2 (two) times a week.  ? losartan (COZAAR) 25 MG tablet Take 25 mg by mouth daily.  ? Multiple Vitamins-Minerals (ICAPS AREDS 2 PO) Take 1 capsule by mouth daily.  ? nitroGLYCERIN (NITROSTAT) 0.4 MG SL tablet DISSOLVE 1 TABLET UNDER TONGUE AS NEEDEDFOR CHEST PAIN. MAY REPEAT 5 MINUTES APART 3 TIMES IF NEEDED. IF NO RELIEF CALL 911  ?  ondansetron (ZOFRAN) 4 MG tablet Take 1 tablet (4 mg total) by mouth every 6 (six) hours.  ? OneTouch Delica Lancets 31S MISC 1 each by In Vitro route daily. Dx Code E11.49  ? OXYGEN Inhale 2 L into the lungs at bedtime. May use in the day time if he gets "winded"  ? pantoprazole (PROTONIX) 40 MG tablet TAKE 1 TABLET BY MOUTH TWICE (2) DAILY  ? polyethylene glycol (MIRALAX / GLYCOLAX) 17 g packet Take 17 g by mouth every other day.  ? pregabalin (LYRICA) 25 MG capsule Take 2 capsules (50 mg total) by mouth 2 (two) times daily.  ? tamsulosin (FLOMAX) 0.4 MG CAPS capsule Take 0.4 mg by mouth at bedtime.  ? [DISCONTINUED] doxycycline (VIBRA-TABS) 100 MG tablet Take 100 mg by mouth 2 (two) times daily.  ? [DISCONTINUED] furosemide (LASIX) 40 MG tablet Take 40 mg by mouth daily as needed. If your weight is over 185#  ? [DISCONTINUED] pyridostigmine (MESTINON) 60 MG tablet Take 1 tablet (60 mg total) by mouth in the morning and at bedtime. (Patient not taking: Reported on 07/06/2021)  ? ?No facility-administered encounter medications on file as of 07/06/2021.  ? ?Allergies  ?Allergen Reactions  ? Metronidazole   ?  Kidney Dr does not want pt to take   ? Doxazosin Mesylate Other (See Comments)  ?  dizziness  ? Methocarbamol Rash  ? ?Patient Active Problem List  ? Diagnosis Date Noted  ? UTI (urinary tract infection) 01/28/2021  ? Bladder cancer ([place]) 01/28/2021  ? Dysuria 10/29/2020  ? Weakness 10/29/2020  ? Macular degeneration, wet ([place]) 06/18/2020  ? Pain due to onychomycosis of toenails of both feet 05/22/2020  ? Ventricular tachycardia ([place]) 04/23/2020  ? Chronic narcotic dependence ([place]) 12/14/2019  ? Plantar flexed metatarsal bone of left foot 11/08/2019  ? Long term prescription opiate use 12/14/2018  ? Neuropathy 12/14/2018  ? Stage 3b chronic kidney disease ([place]) 12/14/2018  ? Problems with swallowing and mastication   ? Achalasia   ? Right inguinal hernia 07/04/2018  ? Advance directive discussed with patient  04/28/2018  ? Chronic pain of both lower extremities 10/26/2017  ? Neuropathic pain 10/26/2017  ? Chronic pain syndrome 10/26/2017  ? Iron deficiency anemia 07/05/2017  ? Constipation 07/05/2017  ? CKD (chronic kidney disease) stage 4, GFR 15-29 ml/min ([name]) 05/17/2017  ? Seropositive rheumatoid arthritis ([place]) 05/17/2017  ? DM (diabetes mellitus) type II controlled, neurological manifestation ([place]) 05/17/2017  ? CAD (coronary artery disease) 05/17/2017  ? Dysphagia 05/17/2017  ? High risk medication use 10/21/2016  ? Primary osteoarthritis of both knees 10/21/2016  ? History of right knee joint replacement 10/21/2016  ? Orthostatic hypotension 10/01/2016  ? Spinal stenosis of lumbar region without neurogenic claudication 03/23/2016  ? Lumbar facet arthropathy 03/23/2016  ? Respiratory failure, chronic ([place]) 11/21/2014  ? Rheumatoid arthritis ([place]) 11/05/2014  ?  Rheumatic fever without heart involvement 03/04/2014  ? Ventral hernia 12/17/2013  ? ILD (interstitial lung disease) ([name]) 11/28/2011  ? (HFpEF) heart failure with preserved ejection fraction ([name]) 09/14/2011  ? Orthostatic dizziness 04/27/2011  ? Diabetic polyneuropathy associated with type 2 diabetes mellitus ([name]) 08/10/2010  ? Type 2 diabetes mellitus with diabetic polyneuropathy ([name]) 08/10/2010  ? Obstructive sleep apnea 12/17/2008  ? ESOPHAGEAL STRICTURE 10/09/2008  ? Esophageal stricture 10/09/2008  ? Reflux esophagitis 09/10/2008  ? Diverticulosis of colon 09/10/2008  ? Gastro-esophageal reflux disease with esophagitis 09/10/2008  ? Coronary atherosclerosis 03/19/2008  ? Aortic stenosis 03/19/2008  ? Thoracic aorta atherosclerosis ([name]) 03/19/2008  ? SLEEP DISORDER, CHRONIC 10/17/2006  ? Disturbance in sleep behavior 10/17/2006  ? 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  ? Essential hypertension 09/23/2006  ? GERD 09/23/2006  ? BENIGN PROSTATIC HYPERTROPHY 09/23/2006  ? Enlarged prostate without lower urinary tract symptoms  (luts) 09/23/2006  ? HLD (hyperlipidemia) 09/23/2006  ? ?Social History  ? ?Socioeconomic History  ? Marital status: Married  ?  Spouse name: Not on file  ? Number of children: 3  ? Years of education: Not on file  ?

## 2021-07-07 NOTE — Progress Notes (Signed)
Assessment and plan noted.  Case discussed with PA-C ?

## 2021-07-08 NOTE — Patient Instructions (Addendum)
DUE TO COVID-19 ONLY TWO VISITORS  (aged 86 and older)  IS ALLOWED TO COME WITH YOU AND STAY IN THE WAITING ROOM ONLY DURING PRE OP AND PROCEDURE.   ?**NO VISITORS ARE ALLOWED IN THE SHORT STAY AREA OR RECOVERY ROOM!!** ? ? ?You are not required to quarantine, however you are required to wear a well-fitted mask when you are out and around people not in your household.  ?Hand Hygiene often ?Do NOT share personal items ?Notify your provider if you are in close contact with someone who has COVID or you develop fever 100.4 or greater, new onset of sneezing, cough, sore throat, shortness of breath or body aches. ?     ? Your procedure is scheduled on:  Tuesday, 07-28-21 ? ? Report to [facility] Main Entrance ? ?  Report to admitting at 8:15 AM ? ? Call this number if you have problems the morning of surgery [phone] ? ? Do not eat food :After Midnight. ? ? After Midnight you may have the following liquids until 7:30 AM DAY OF SURGERY ? ?Water ?Black Coffee (sugar ok, NO MILK/CREAM OR CREAMERS)  ?Tea (sugar ok, NO MILK/CREAM OR CREAMERS) regular and decaf                             ?Plain Jell-O (NO RED)                                           ?Fruit ices (not with fruit pulp, NO RED)                                     ?Popsicles (NO RED)                                                                  ?Juice: apple, WHITE grape, WHITE cranberry ?Sports drinks like Gatorade (NO RED) ?Clear broth(vegetable,chicken,beef) ?         ? ?FOLLOW  ANY ADDITIONAL PRE OP INSTRUCTIONS YOU RECEIVED FROM YOUR SURGEON'S OFFICE!!! ?  ?  ?Oral Hygiene is also important to reduce your risk of infection.                                    ?Remember - BRUSH YOUR TEETH THE MORNING OF SURGERY WITH YOUR REGULAR TOOTHPASTE ? ? Do NOT smoke after Midnight ? ?Take these medicines the morning of surgery with A SIP OF WATER:  Amlodipine, Carvedilol, Finasteride, Pantoprazole, Pregabalin.  Hydrocodone if needed ? ?How to Manage Your  Diabetes ?Before and After Surgery ? ?Why is it important to control my blood sugar before and after surgery? ?Improving blood sugar levels before and after surgery helps healing and can limit problems. ?A way of improving blood sugar control is eating a healthy diet by: ? Eating less sugar and carbohydrates ? Increasing activity/exercise ? Talking with your doctor about reaching your blood sugar goals ?High blood sugars (greater than 180  mg/dL) can raise your risk of infections and slow your recovery, so you will need to focus on controlling your diabetes during the weeks before surgery. ?Make sure that the doctor who takes care of your diabetes knows about your planned surgery including the date and location. ? ?How do I manage my blood sugar before surgery? ?Check your blood sugar at least 4 times a day, starting 2 days before surgery, to make sure that the level is not too high or low. ?Check your blood sugar the morning of your surgery when you wake up and every 2 hours until you get to the Short Stay unit. ?If your blood sugar is less than 70 mg/dL, you will need to treat for low blood sugar: ?Do not take insulin. ?Treat a low blood sugar (less than 70 mg/dL) with ? cup of clear juice (cranberry or apple), 4 glucose tablets, OR glucose gel. ?Recheck blood sugar in 15 minutes after treatment (to make sure it is greater than 70 mg/dL). If your blood sugar is not greater than 70 mg/dL on recheck, call [phone] for further instructions. ?Report your blood sugar to the short stay nurse when you get to Short Stay. ? ?If you are admitted to the hospital after surgery: ?Your blood sugar will be checked by the staff and you will probably be given insulin after surgery (instead of oral diabetes medicines) to make sure you have good blood sugar levels. ?The goal for blood sugar control after surgery is 80-180 mg/dL. ? ? ?WHAT DO I DO ABOUT MY DIABETES MEDICATION? ? ?Do not take oral diabetes medicines (pills) the  morning of surgery. ? ? ?Reviewed and Endorsed by [facility] Patient Education Committee, August 2015  ? ?Bring CPAP mask and tubing day of surgery. ?                  ?           You may not have any metal on your body including jewelry, and body piercing ? ?           Do not wear lotions, powders, cologne, or deodorant ? ?            Men may shave face and neck. ? ? Do not bring valuables to the hospital. [place]. ? ? Contacts, dentures or bridgework may not be worn into surgery. ? ?Patients discharged on the day of surgery will not be allowed to drive home.  Someone NEEDS to stay with you for the first 24 hours after anesthesia. ? ?Special Instructions: Bring a copy of your healthcare power of attorney and living will documents         the day of surgery if you haven't scanned them before. ? ?Please read over the following fact sheets you were given: IF [place] [phone] ? ?[facility] - Preparing for Surgery ?Before surgery, you can play an important role.  Because skin is not sterile, your skin needs to be as free of germs as possible.  You can reduce the number of germs on your skin by washing with CHG (chlorahexidine gluconate) soap before surgery.  CHG is an antiseptic cleaner which kills germs and bonds with the skin to continue killing germs even after washing. ?Please DO NOT use if you have an allergy to CHG or antibacterial soaps.  If your skin becomes reddened/irritated stop using the CHG and inform your  nurse when you arrive at Short Stay. ?Do not shave (including legs and underarms) for at least 48 hours prior to the first CHG shower.  You may shave your face/neck. ? ?Please follow these instructions carefully: ? 1.  Shower with CHG Soap the night before surgery and the  morning of surgery. ? 2.  If you choose to wash your hair, wash your hair first as usual with your normal  shampoo. ? 3.  After you  shampoo, rinse your hair and body thoroughly to remove the shampoo.                            ? 4.  Use CHG as you would any other liquid soap.  You can apply chg directly to the skin and wash.  Gently with a scrungie or clean washcloth. ? 5.  Apply the CHG Soap to your body ONLY FROM THE NECK DOWN.   Do   not use on face/ open      ?                     Wound or open sores. Avoid contact with eyes, ears mouth and   genitals (private parts).  ?                     Production manager,  Genitals (private parts) with your normal soap. ?            6.  Wash thoroughly, paying special attention to the area where your    surgery  will be performed. ? 7.  Thoroughly rinse your body with warm water from the neck down. ? 8.  DO NOT shower/wash with your normal soap after using and rinsing off the CHG Soap. ?               9.  Pat yourself dry with a clean towel. ?           10.  Wear clean pajamas. ?           11.  Place clean sheets on your bed the night of your first shower and do not  sleep with pets. ?Day of Surgery : ?Do not apply any lotions/deodorants the morning of surgery.  Please wear clean clothes to the hospital/surgery center. ? ?FAILURE TO FOLLOW THESE INSTRUCTIONS MAY RESULT IN THE CANCELLATION OF YOUR SURGERY ? ?PATIENT SIGNATURE_________________________________ ? ?NURSE SIGNATURE__________________________________ ? ?________________________________________________________________________  ?  ?

## 2021-07-09 NOTE — Telephone Encounter (Signed)
Left voicemail on patient's daughter, [name] about message from Cardiology. Advised if she had any questions to call back.  ?

## 2021-07-13 ENCOUNTER — Other Ambulatory Visit: Payer: Self-pay | Admitting: Neurology

## 2021-07-13 ENCOUNTER — Other Ambulatory Visit: Payer: Self-pay | Admitting: Student in an Organized Health Care Education/Training Program

## 2021-07-13 DIAGNOSIS — G8929 Other chronic pain: Secondary | ICD-10-CM

## 2021-07-13 DIAGNOSIS — M792 Neuralgia and neuritis, unspecified: Secondary | ICD-10-CM

## 2021-07-13 DIAGNOSIS — G894 Chronic pain syndrome: Secondary | ICD-10-CM

## 2021-07-13 DIAGNOSIS — M47816 Spondylosis without myelopathy or radiculopathy, lumbar region: Secondary | ICD-10-CM

## 2021-07-13 DIAGNOSIS — E1142 Type 2 diabetes mellitus with diabetic polyneuropathy: Secondary | ICD-10-CM

## 2021-07-14 ENCOUNTER — Encounter (HOSPITAL_COMMUNITY)
Admission: RE | Admit: 2021-07-14 | Discharge: 2021-07-14 | Disposition: A | Discharge status: Home / Self Care | Payer: Medicare Other | Source: Ambulatory Visit | Attending: Urology | Admitting: Urology

## 2021-07-14 ENCOUNTER — Encounter (HOSPITAL_COMMUNITY): Payer: Self-pay

## 2021-07-14 ENCOUNTER — Other Ambulatory Visit: Payer: Self-pay

## 2021-07-14 VITALS — BP 141/75 | HR 90 | Temp 97.8°F | Resp 18 | Ht 72.0 in | Wt 179.0 lb

## 2021-07-14 DIAGNOSIS — I110 Hypertensive heart disease with heart failure: Secondary | ICD-10-CM | POA: Insufficient documentation

## 2021-07-14 DIAGNOSIS — N201 Calculus of ureter: Secondary | ICD-10-CM | POA: Diagnosis not present

## 2021-07-14 DIAGNOSIS — Z955 Presence of coronary angioplasty implant and graft: Secondary | ICD-10-CM | POA: Diagnosis not present

## 2021-07-14 DIAGNOSIS — E1142 Type 2 diabetes mellitus with diabetic polyneuropathy: Secondary | ICD-10-CM | POA: Insufficient documentation

## 2021-07-14 DIAGNOSIS — I2510 Atherosclerotic heart disease of native coronary artery without angina pectoris: Secondary | ICD-10-CM | POA: Diagnosis not present

## 2021-07-14 DIAGNOSIS — Z951 Presence of aortocoronary bypass graft: Secondary | ICD-10-CM | POA: Diagnosis not present

## 2021-07-14 DIAGNOSIS — I350 Nonrheumatic aortic (valve) stenosis: Secondary | ICD-10-CM | POA: Diagnosis not present

## 2021-07-14 DIAGNOSIS — I5032 Chronic diastolic (congestive) heart failure: Secondary | ICD-10-CM | POA: Diagnosis not present

## 2021-07-14 DIAGNOSIS — Z7902 Long term (current) use of antithrombotics/antiplatelets: Secondary | ICD-10-CM | POA: Insufficient documentation

## 2021-07-14 DIAGNOSIS — Z01812 Encounter for preprocedural laboratory examination: Secondary | ICD-10-CM | POA: Diagnosis not present

## 2021-07-14 DIAGNOSIS — J849 Interstitial pulmonary disease, unspecified: Secondary | ICD-10-CM | POA: Diagnosis not present

## 2021-07-14 DIAGNOSIS — E119 Type 2 diabetes mellitus without complications: Secondary | ICD-10-CM

## 2021-07-14 HISTORY — DX: Nonrheumatic aortic (valve) stenosis: I35.0

## 2021-07-14 LAB — CBC
HCT: 42.5 % (ref 39.0–52.0)
Hemoglobin: 13.3 g/dL (ref 13.0–17.0)
MCH: 26.5 pg (ref 26.0–34.0)
MCHC: 31.3 g/dL (ref 30.0–36.0)
MCV: 84.7 fL (ref 80.0–100.0)
Platelets: 187 10*3/uL (ref 150–400)
RBC: 5.02 MIL/uL (ref 4.22–5.81)
RDW: 15.1 % (ref 11.5–15.5)
WBC: 9.4 10*3/uL (ref 4.0–10.5)
nRBC: 0 % (ref 0.0–0.2)

## 2021-07-14 LAB — BASIC METABOLIC PANEL
Anion gap: 8 (ref 5–15)
BUN: 37 mg/dL — ABNORMAL HIGH (ref 8–23)
CO2: 31 mmol/L (ref 22–32)
Calcium: 9.9 mg/dL (ref 8.9–10.3)
Chloride: 100 mmol/L (ref 98–111)
Creatinine, Ser: 1.89 mg/dL — ABNORMAL HIGH (ref 0.61–1.24)
GFR, Estimated: 34 mL/min — ABNORMAL LOW (ref >60)
Glucose, Bld: 326 mg/dL — ABNORMAL HIGH (ref 70–99)
Potassium: 4.4 mmol/L (ref 3.5–5.1)
Sodium: 139 mmol/L (ref 135–145)

## 2021-07-14 LAB — GLUCOSE, CAPILLARY: Glucose-Capillary: 340 mg/dL — ABNORMAL HIGH (ref 70–99)

## 2021-07-14 LAB — HEMOGLOBIN A1C
Hgb A1c MFr Bld: 7.5 % — ABNORMAL HIGH (ref 4.8–5.6)
Mean Plasma Glucose: 168.55 mg/dL

## 2021-07-14 NOTE — Progress Notes (Addendum)
Please place orders in epic for surgery.  Please disregard. Orders were in place ?

## 2021-07-14 NOTE — Progress Notes (Addendum)
PCP-Richard Silvio Pate, MD ?Cardiologist - [name] Clearance in epic for an upcoming Endoscopy procedure ? ?PPM/ICD -  ?Device Orders -  ?Rep Notified -  ? ?Chest x-ray - 01-27-21 epic ?EKG - 01-29-21 epic ?Stress Test - 2016 epic ?ECHO - 01-29-21 epic ?Cardiac Cath - 2017 epic ? ?Sleep Study -  ?CPAP -  ? ?Fasting Blood Sugar - 190 ?Checks Blood Sugar __3 x a week___  ? ?Blood Thinner Instructions: ?Aspirin Instructions: 81 mg and Plavix  stop 5 days and  ASA stop 1 day  ? ?ERAS Protcol - ?PRE-SURGERY Ensure or G2-  ? ?COVID TEST-  ?COVID vaccine - ? ?Activity-- SOB when walks with walker in driveway not new for pt. ? ?Anesthesia review: Aortic stenosis, CAD,CHF, CKD, Murmur, MI, Home o2 at night , HTN, DM, OSA, BS 340 At preop Willeen Cass, NP aware, Creatine 1.89 ? ?Patient denies shortness of breath, fever, cough and chest pain at PAT appointment ? ? ?All instructions explained to the patient, with a verbal understanding of the material. Patient agrees to go over the instructions while at home for a better understanding. Patient also instructed to self quarantine after being tested for COVID-19. The opportunity to ask questions was provided. ?  ?

## 2021-07-16 ENCOUNTER — Encounter (HOSPITAL_COMMUNITY): Payer: Self-pay

## 2021-07-16 ENCOUNTER — Encounter: Payer: Self-pay | Admitting: Internal Medicine

## 2021-07-16 DIAGNOSIS — J8410 Pulmonary fibrosis, unspecified: Secondary | ICD-10-CM | POA: Diagnosis not present

## 2021-07-16 DIAGNOSIS — R0689 Other abnormalities of breathing: Secondary | ICD-10-CM | POA: Diagnosis not present

## 2021-07-16 NOTE — Anesthesia Preprocedure Evaluation (Addendum)
Anesthesia Evaluation  ?Patient identified by MRN, date of birth, ID band ?Patient awake ? ? ? ?Reviewed: ?Allergy & Precautions, NPO status , Patient's Chart, lab work & pertinent test results ? ?History of Anesthesia Complications ?Negative for: history of anesthetic complications ? ?Airway ?Mallampati: II ? ?TM Distance: >3 FB ?Neck ROM: Full ? ? ? Dental ? ?(+) Missing,  ?  ?Pulmonary ?sleep apnea and Oxygen sleep apnea , former smoker,  ?ILD ?  ?Pulmonary exam normal ? ? ? ? ? ? ? Cardiovascular ?hypertension, Pt. on medications ?+ CAD, + Past MI, + Cardiac Stents, + CABG (2001) and +CHF  ?Normal cardiovascular exam ? ?TTE 01/2021: EF 60-65%, grade I DD, mild RVE, mild to moderate AS ?  ?Neuro/Psych ?negative neurological ROS ? negative psych ROS  ? GI/Hepatic ?Neg liver ROS, hiatal hernia, GERD  Medicated and Controlled,  ?Endo/Other  ?diabetes, Type 2 ? Renal/GU ?Renal InsufficiencyRenal disease (Cr 1.89)  ?negative genitourinary ?  ?Musculoskeletal ? ?(+) Arthritis , Rheumatoid disorders,   ? Abdominal ?  ?Peds ? Hematology ?negative hematology ROS ?(+)   ?Anesthesia Other Findings ?Day of surgery medications reviewed with patient. ? Reproductive/Obstetrics ?negative OB ROS ? ?  ? ? ? ? ? ? ? ? ? ? ? ? ? ?  ?  ? ? ? ? ? ? ?Anesthesia Physical ?Anesthesia Plan ? ?ASA: 3 ? ?Anesthesia Plan: General  ? ?Post-op Pain Management: Tylenol PO (pre-op)*  ? ?Induction: Intravenous ? ?PONV Risk Score and Plan: 2 and Treatment may vary due to age or medical condition, Ondansetron and Dexamethasone ? ?Airway Management Planned: LMA ? ?Additional Equipment: None ? ?Intra-op Plan:  ? ?Post-operative Plan: Extubation in OR ? ?Informed Consent: I have reviewed the patients History and Physical, chart, labs and discussed the procedure including the risks, benefits and alternatives for the proposed anesthesia with the patient or authorized representative who has indicated his/her  understanding and acceptance.  ? ? ? ?Dental advisory given ? ?Plan Discussed with: CRNA ? ?Anesthesia Plan Comments: (Patient has acute right hip pain that began last night. He has been nonambulatory since that time. He normally ambulates independently. He has pain in his right groin and lateral hip and does not tolerate any movement including rotation of his leg. Dicussed with Dr. Rhona Raider (orthopedics). Plan for xray right hip prior to procedure. Daiva Huge, MD ? ?*right hip xray negative for acute fracture/dislocation)  ? ? ?Anesthesia Quick Evaluation ? ?

## 2021-07-16 NOTE — Progress Notes (Signed)
Anesthesia Chart Review: ? ? Case: [id number] Date/Time: 07/28/21 1015  ? Procedures:  ?    CYSTOSCOPY WITH RETROGRADE PYELOGRAM, URETEROSCOPY AND STENT PLACEMENT (Right) ?    HOLMIUM LASER APPLICATION (Right)  ? Anesthesia type: General  ? Pre-op diagnosis: URETERAL CALCULUS  ? Location: [facility] PROCEDURE ROOM / [facility]  ? Surgeons: Robley Fries, MD  ? ?  ? ? ?DISCUSSION: ?Pt is 88 years old with hx CAD (s/p CABG 2001; s/p DES to RCA and cutting POBA to ostial PDA 2013; s/p DES to SVG-OM2 2015; DES to SVG-OM 2017, CTO SVG-RCA and SVG-D1) ? ?Other hx includes: aortic stenosis (mild-mod by 01/29/21 echo), chronic diastolic HF, HTN, DM, ILD (mild per pulmonology notes), uses home O2 2L at night ? ? ?Recently evaluated by neurologist Narda Amber, DO for myasthenia gravis; telephone note 06/09/21 documents "Overall I do not think that he has myasthenia as his symptomology and presentation is not typical for neuromuscular juncture.  Very borderline AChR antibody is most likely false positive." ? ?- Glucose 326, A1c 7.5. Pt aware if glucose elevated day of surgery, his procedure may be cancelled.  ? ?- Pt to stop plavix 5 days before surgery ? ? ?VS: BP (!) 141/75   Pulse 90   Temp 36.6 ?C (Oral)   Resp 18   Ht 6' (1.829 m)   Wt 81.2 kg   SpO2 98%   BMI 24.28 kg/m?  ? ?PROVIDERS: ?- PCP is [name], MD ?- Cardiologist is [name], MD. Last office visit 03/04/21 with Richardson Dopp, PA ?- Pulmonologist is Brand Males, MD. Last office visit 02/20/21 ? ? ?LABS:  Glucose 326, A1c 7.5. Cr 1.89 is consistent with prior results/CKD  ?(all labs ordered are listed, but only abnormal results are displayed) ? ?Labs Reviewed  ?HEMOGLOBIN A1C - Abnormal; Notable for the following components:  ?    Result Value  ? Hgb A1c MFr Bld 7.5 (*)   ? All other components within normal limits  ?BASIC METABOLIC PANEL - Abnormal; Notable for the following components:  ? Glucose, Bld 326 (*)   ? BUN 37 (*)   ? Creatinine, Ser  1.89 (*)   ? GFR, Estimated 34 (*)   ? All other components within normal limits  ?GLUCOSE, CAPILLARY - Abnormal; Notable for the following components:  ? Glucose-Capillary 340 (*)   ? All other components within normal limits  ?CBC  ? ? ? ?IMAGES: ?CT chest hi res 03/04/21:  ?1. Chronic cylindrical bronchiectasis and mild scarring in the lungs, similar to the prior study. ?2. No evidence of interstitial lung disease. ?3. Mild air trapping indicative of mild small airways disease. ?4. Right pleural thickening calcification, presumably related to remote right-sided pleural hemorrhage and/or pleural infection. ?5. Aortic atherosclerosis, in addition to left main and 3 vessel coronary artery disease. Status post median sternotomy for CABG including LIMA to the LAD. ?6. There are extremely severe calcifications of the aortic valve. Echocardiographic correlation for evaluation of potential valvular dysfunction is strongly recommended ? ? ?EKG 01/28/21: Sinus rhythm. Ventricular trigeminy is new. Borderline left axis deviation. Borderline T wave abnormalities ? ? ?CV: ?Echo 01/29/21:  ?1. Left ventricular ejection fraction, by estimation, is 60 to 65%. The left ventricle has normal function. The left ventricle has no regional wall motion abnormalities. Left ventricular diastolic parameters are consistent with Grade I diastolic dysfunction (impaired relaxation).  ?2. Right ventricular systolic function is normal. The right ventricular size is mildly enlarged. Tricuspid  regurgitation signal is inadequate for assessing PA pressure.  ?3. The mitral valve is degenerative. No evidence of mitral valve regurgitation. No evidence of mitral stenosis.  ?4. The aortic valve is calcified. There is moderate calcification of the aortic valve. There is moderate thickening of the aortic valve. Aortic valve regurgitation is not visualized. Mild to moderate aortic valve stenosis. Aortic valve area, by VTI measures 1.35 cm?[name] Aortic valve  mean gradient measures 14.0 mmHg. Aortic valve Vmax measures 2.47 m/s. ? ?Cardiac cath 08/13/15:  ?Prox RCA lesion, 100% stenosed. ?LIMA . ?The LIMA to LAD graft is widely patent without stenosis. ?Sequential SVG . ?The sequential saphenous vein graft to OM1 and OM 2 has critical stenosis within the previously stented segment in the proximal body of the graft. ?SVG . ?Graft known to be totally occluded, not selectively injected ?Origin lesion, 100% stenosed. ?SVG . ?The graft is chronically occluded ?Prox Graft lesion, 100% stenosed. ?Prox LAD lesion, 100% stenosed. ?Ost LM lesion, 80% stenosed. ?Ost 2nd Mrg to 2nd Mrg lesion, 90% stenosed. ?Prox Graft to Mid Graft lesion, before 1st Mrg, 99% stenosed. Post intervention, there is a 0% residual stenosis. The lesion was previously treated with a stent (unknown type). ?1. Severe native three-vessel coronary artery disease with severe left main stenosis, total occlusion of the LAD, total occlusion of the RCA, and severe native left circumflex stenosis ?2. Status post aortocoronary bypass surgery with continued patency of the LIMA to LAD and continued patency of the saphenous vein graft to OM with severe stenosis in the proximal body of the graft (in-stent) ?3. Chronic total occlusion of the saphenous graft RCA and saphenous vein graft to diagonal ?4. Successful PCI of the saphenous vein graft OM with placement of a drug-eluting stent(treatment of in-stent restenosis) ? ? ?Past Medical History:  ?Diagnosis Date  ? Aortic stenosis   ? Arthritis   ? osteoarthritis, s/p R TKR, and digits  ? CAD (coronary artery disease)   ? a. s/p CABG (2001)  b. s/p DES to RCA and cutting POBA to ostial PDA (2013)   c. s/p DES to SVG to OM2 (01/14/14) d. cath: 08/2015 NSTEMI w/ patent LIMA-LAD and 99% stenosis of SVG-OM w/ DES placed. CTO of SVG-RCA and SVG-D1.   ? Cancer [facility])   ? bladder  and skin  ? Cataract   ? Chronic diastolic CHF (congestive heart failure) ([place])   ? a) 09/13 ECHO-  LVEF 97-41%, grade 1 diastolic dysfunction, mild LA dilatation, atrial septal aneurysm, AV mobility restricted, but no sig AS by doppler; b) 09/04/08 ECHO- LVH, ef 60%, mild AS, c. echo 08/2015: EF perserved of 55-60% with inferolateral HK. Mild AS noted.  ? Chronic kidney disease, stage III (moderate) ([name])   ? Chronic lower back pain   ? Colon polyps   ? COVID-19   ? Diverticulosis   ? Dyspnea 2009 since July -Sept  ? 05/06/08-[facility]ST-  normal effort, reduced VO2 max 20.5 /65%, reduced at 8.2/ 40%, normal breathing resetvca of 55%, submaximal heart rate response 112/77%, flattened o2 pluse response at peak exercise-12 ml/beat @ 85%, No VQ mismatch abnormalities, All c/w CIRC Limitation  ? Enlarged prostate   ? Esophageal stricture   ? a. s/p dilation spring 2010  ? GERD (gastroesophageal reflux disease)   ? Heart murmur   ? Hiatal hernia   ? History of carpal tunnel syndrome   ? Bilateral  ? History of kidney stones   ? History of PFTs   ? mixed  pattern on spiro. mild restn on lung volumes with near normal DLCO. Pattern can be explained by CABG scar. Fev1 2.2L/73%, ratio 68 (67), TLC 4.7/68%,RV 1.5L/55%,DLCO 79%  ? Hyperlipidemia   ? Hypertension   ? Interstitial lung disease ([place])   ? NOS  ? Iron deficiency anemia   ? Myocardial infarction [facility])   ? Nausea & vomiting   ? 2018/2019  ? On home oxygen therapy   ? 2 L [place] at bedtime  ? Osteoporosis   ? Overweight (BMI 25.0-29.9)   ? BMI 29  ? Peripheral neuropathy   ? RA (rheumatoid arthritis) ([place])   ? [name]  ? Seropositive rheumatoid arthritis ([place])   ? Type II diabetes mellitus ([place])   ? diet controlled  ? Wears glasses   ? Wears partial dentures   ? upper  ? ? ?Past Surgical History:  ?Procedure Laterality Date  ? BALLOON DILATION N/A 09/12/2018  ? Procedure: BALLOON DILATION;  Surgeon: Irene Shipper, MD;  Location: [facility];  Service: Endoscopy;  Laterality: N/A;  ? BALLOON DILATION N/A 02/20/2020  ? Procedure: BALLOON DILATION;  Surgeon: Irene Shipper, MD;   Location: [facility];  Service: Endoscopy;  Laterality: N/A;  ? BOTOX INJECTION N/A 09/12/2018  ? Procedure: BOTOX INJECTION;  Surgeon: Irene Shipper, MD;  Location: [facility];  Service: Endoscopy;  Laterality: N/A

## 2021-07-21 ENCOUNTER — Other Ambulatory Visit: Payer: Self-pay

## 2021-07-27 NOTE — Progress Notes (Addendum)
? ?Office Visit Note ? ?Patient: [name]             ?Date of Birth: 1932-02-16           ?MRN: [id number]             ?PCP: [name], MD ?Referring: [name], MD ?Visit Date: 07/29/2021 ?Occupation: '@GUAROCC'$ @ ? ?Subjective:  ?Pain and swelling in right index finger ? ?History of Present Illness: [name] is a 88 y.o. male with history of rheumatoid arthritis and ILD.  He states last Friday he started having pain and swelling in his right index finger which she describes over the MCP.  He states the swelling gradually resolved and today he does not have any swelling.  He denies any episodes of recurrent swelling.  None of the other joints are painful or swollen.  He denies any increased shortness of breath. ? ?Activities of Daily Living:  ?Patient reports morning stiffness for 5-10 minutes.   ?Patient Denies nocturnal pain.  ?Difficulty dressing/grooming: Reports ?Difficulty climbing stairs: Reports ?Difficulty getting out of chair: Denies ?Difficulty using hands for taps, buttons, cutlery, and/or writing: Denies ? ?Review of Systems  ?Constitutional:  Positive for fatigue.  ?HENT:  Negative for mouth sores, mouth dryness and nose dryness.   ?Eyes:  Negative for pain, itching and dryness.  ?Respiratory:  Positive for shortness of breath. Negative for difficulty breathing.   ?Cardiovascular:  Negative for chest pain and palpitations.  ?Gastrointestinal:  Positive for constipation. Negative for blood in stool and diarrhea.  ?Endocrine: Positive for increased urination.  ?Genitourinary:  Positive for difficulty urinating.  ?Musculoskeletal:  Positive for joint pain, joint pain, joint swelling and morning stiffness. Negative for myalgias, muscle tenderness and myalgias.  ?Skin:  Negative for color change, rash and redness.  ?Allergic/Immunologic: Negative for susceptible to infections.  ?Neurological:  Positive for weakness. Negative for dizziness, numbness, headaches and memory loss.   ?Hematological:  Positive for bruising/bleeding tendency.  ?Psychiatric/Behavioral:  Negative for confusion.   ? ?PMFS History:  ?Patient Active Problem List  ? Diagnosis Date Noted  ? UTI (urinary tract infection) 01/28/2021  ? Bladder cancer ([place]) 01/28/2021  ? Dysuria 10/29/2020  ? Weakness 10/29/2020  ? Macular degeneration, wet ([place]) 06/18/2020  ? Pain due to onychomycosis of toenails of both feet 05/22/2020  ? Ventricular tachycardia ([place]) 04/23/2020  ? Chronic narcotic dependence ([place]) 12/14/2019  ? Plantar flexed metatarsal bone of left foot 11/08/2019  ? Long term prescription opiate use 12/14/2018  ? Neuropathy 12/14/2018  ? Stage 3b chronic kidney disease ([place]) 12/14/2018  ? Problems with swallowing and mastication   ? Achalasia   ? Right inguinal hernia 07/04/2018  ? Advance directive discussed with patient 04/28/2018  ? Chronic pain of both lower extremities 10/26/2017  ? Neuropathic pain 10/26/2017  ? Chronic pain syndrome 10/26/2017  ? Iron deficiency anemia 07/05/2017  ? Constipation 07/05/2017  ? CKD (chronic kidney disease) stage 4, GFR 15-29 ml/min ([name]) 05/17/2017  ? Seropositive rheumatoid arthritis ([place]) 05/17/2017  ? DM (diabetes mellitus) type II controlled, neurological manifestation ([place]) 05/17/2017  ? CAD (coronary artery disease) 05/17/2017  ? Dysphagia 05/17/2017  ? High risk medication use 10/21/2016  ? Primary osteoarthritis of both knees 10/21/2016  ? History of right knee joint replacement 10/21/2016  ? Orthostatic hypotension 10/01/2016  ? Spinal stenosis of lumbar region without neurogenic claudication 03/23/2016  ? Lumbar facet arthropathy 03/23/2016  ? Respiratory failure, chronic ([place]) 11/21/2014  ? Rheumatoid  arthritis ([place]) 11/05/2014  ? Rheumatic fever without heart involvement 03/04/2014  ? Ventral hernia 12/17/2013  ? ILD (interstitial lung disease) ([place]) 11/28/2011  ? (HFpEF) heart failure with preserved ejection fraction ([place]) 09/14/2011  ? Orthostatic dizziness  04/27/2011  ? Diabetic polyneuropathy associated with type 2 diabetes mellitus ([place]) 08/10/2010  ? Type 2 diabetes mellitus with diabetic polyneuropathy ([place]) 08/10/2010  ? Obstructive sleep apnea 12/17/2008  ? ESOPHAGEAL STRICTURE 10/09/2008  ? Esophageal stricture 10/09/2008  ? Reflux esophagitis 09/10/2008  ? Diverticulosis of colon 09/10/2008  ? Gastro-esophageal reflux disease with esophagitis 09/10/2008  ? Coronary atherosclerosis 03/19/2008  ? Aortic stenosis 03/19/2008  ? Thoracic aorta atherosclerosis ([place]) 03/19/2008  ? SLEEP DISORDER, CHRONIC 10/17/2006  ? Disturbance in sleep behavior 10/17/2006  ? 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  ? Essential hypertension 09/23/2006  ? GERD 09/23/2006  ? BENIGN PROSTATIC HYPERTROPHY 09/23/2006  ? Enlarged prostate without lower urinary tract symptoms (luts) 09/23/2006  ? HLD (hyperlipidemia) 09/23/2006  ?  ?Past Medical History:  ?Diagnosis Date  ? Aortic stenosis   ? Arthritis   ? osteoarthritis, s/p R TKR, and digits  ? CAD (coronary artery disease)   ? a. s/p CABG (2001)  b. s/p DES to RCA and cutting POBA to ostial PDA (2013)   c. s/p DES to SVG to OM2 (01/14/14) d. cath: 08/2015 NSTEMI w/ patent LIMA-LAD and 99% stenosis of SVG-OM w/ DES placed. CTO of SVG-RCA and SVG-D1.   ? [facility])   ? bladder  and skin  ? Cataract   ? Chronic diastolic CHF (congestive heart failure) ([place])   ? a) 09/13 ECHO- LVEF 70-17%, grade 1 diastolic dysfunction, mild LA dilatation, atrial septal aneurysm, AV mobility restricted, but no sig AS by doppler; b) 09/04/08 ECHO- LVH, ef 60%, mild AS, c. echo 08/2015: EF perserved of 55-60% with inferolateral HK. Mild AS noted.  ? Chronic kidney disease, stage III (moderate) ([name])   ? Chronic lower back pain   ? Colon polyps   ? COVID-19   ? Diverticulosis   ? Dyspnea 2009 since July -Sept  ? 05/06/08-CPST-  normal effort, reduced VO2 max 20.5 /65%, reduced at 8.2/ 40%, normal breathing resetvca of 55%, submaximal heart  rate response 112/77%, flattened o2 pluse response at peak exercise-12 ml/beat @ 85%, No VQ mismatch abnormalities, All c/w CIRC Limitation  ? Enlarged prostate   ? Esophageal stricture   ? a. s/p dilation spring 2010  ? GERD (gastroesophageal reflux disease)   ? Heart murmur   ? Hiatal hernia   ? History of carpal tunnel syndrome   ? Bilateral  ? History of kidney stones   ? History of PFTs   ? mixed pattern on spiro. mild restn on lung volumes with near normal DLCO. Pattern can be explained by CABG scar. Fev1 2.2L/73%, ratio 68 (67), TLC 4.7/68%,RV 1.5L/55%,DLCO 79%  ? Hyperlipidemia   ? Hypertension   ? Interstitial lung disease ([place])   ? NOS  ? Iron deficiency anemia   ? Myocardial infarction [facility])   ? Nausea & vomiting   ? 2018/2019  ? On home oxygen therapy   ? 2 L [place] at bedtime  ? Osteoporosis   ? Overweight (BMI 25.0-29.9)   ? BMI 29  ? Peripheral neuropathy   ? RA (rheumatoid arthritis) ([place])   ? [name]  ? Seropositive rheumatoid arthritis ([place])   ? Type II diabetes mellitus ([place])   ? diet controlled  ? Wears glasses   ?  Wears partial dentures   ? upper  ?  ?Family History  ?Problem Relation Age of Onset  ? COPD Mother   ? Heart disease Father   ? Heart attack Father   ? Stomach cancer Brother   ? Stroke Sister   ? Alcohol abuse Sister   ? Colon cancer Brother 25  ? Diabetes Brother   ? Rectal cancer Neg Hx   ? ?Past Surgical History:  ?Procedure Laterality Date  ? BALLOON DILATION N/A 09/12/2018  ? Procedure: BALLOON DILATION;  Surgeon: Irene Shipper, MD;  Location: [facility];  Service: Endoscopy;  Laterality: N/A;  ? BALLOON DILATION N/A 02/20/2020  ? Procedure: BALLOON DILATION;  Surgeon: Irene Shipper, MD;  Location: [facility];  Service: Endoscopy;  Laterality: N/A;  ? BOTOX INJECTION N/A 09/12/2018  ? Procedure: BOTOX INJECTION;  Surgeon: Irene Shipper, MD;  Location: [facility];  Service: Endoscopy;  Laterality: N/A;  ? BOTOX INJECTION N/A 11/27/2018  ? Procedure: BOTOX INJECTION;  Surgeon:  Irene Shipper, MD;  Location: [facility];  Service: Endoscopy;  Laterality: N/A;  ? BOTOX INJECTION N/A 02/20/2020  ? Procedure: BOTOX INJECTION;  Surgeon: Irene Shipper, MD;  Location: [facility];  Service:

## 2021-07-28 ENCOUNTER — Ambulatory Visit (HOSPITAL_COMMUNITY): Payer: Medicare Other

## 2021-07-28 ENCOUNTER — Encounter (HOSPITAL_COMMUNITY): Payer: Self-pay | Admitting: Urology

## 2021-07-28 ENCOUNTER — Ambulatory Visit (HOSPITAL_COMMUNITY)
Admission: RE | Admit: 2021-07-28 | Discharge: 2021-07-28 | Disposition: A | Discharge status: Home / Self Care | Payer: Medicare Other | Attending: Urology | Admitting: Urology

## 2021-07-28 ENCOUNTER — Encounter (HOSPITAL_COMMUNITY)
Admission: RE | Disposition: A | Discharge status: Home / Self Care | Payer: Self-pay | Source: Home / Self Care | Attending: Urology

## 2021-07-28 ENCOUNTER — Ambulatory Visit (HOSPITAL_BASED_OUTPATIENT_CLINIC_OR_DEPARTMENT_OTHER): Payer: Medicare Other | Admitting: Emergency Medicine

## 2021-07-28 ENCOUNTER — Ambulatory Visit (HOSPITAL_COMMUNITY): Payer: Medicare Other | Admitting: Emergency Medicine

## 2021-07-28 DIAGNOSIS — N3289 Other specified disorders of bladder: Secondary | ICD-10-CM | POA: Diagnosis not present

## 2021-07-28 DIAGNOSIS — Z951 Presence of aortocoronary bypass graft: Secondary | ICD-10-CM | POA: Insufficient documentation

## 2021-07-28 DIAGNOSIS — N132 Hydronephrosis with renal and ureteral calculous obstruction: Secondary | ICD-10-CM | POA: Diagnosis not present

## 2021-07-28 DIAGNOSIS — N201 Calculus of ureter: Secondary | ICD-10-CM | POA: Diagnosis not present

## 2021-07-28 DIAGNOSIS — I2510 Atherosclerotic heart disease of native coronary artery without angina pectoris: Secondary | ICD-10-CM | POA: Diagnosis not present

## 2021-07-28 DIAGNOSIS — I509 Heart failure, unspecified: Secondary | ICD-10-CM | POA: Diagnosis not present

## 2021-07-28 DIAGNOSIS — C679 Malignant neoplasm of bladder, unspecified: Secondary | ICD-10-CM | POA: Diagnosis not present

## 2021-07-28 DIAGNOSIS — E119 Type 2 diabetes mellitus without complications: Secondary | ICD-10-CM

## 2021-07-28 DIAGNOSIS — N184 Chronic kidney disease, stage 4 (severe): Secondary | ICD-10-CM | POA: Diagnosis not present

## 2021-07-28 DIAGNOSIS — I252 Old myocardial infarction: Secondary | ICD-10-CM | POA: Diagnosis not present

## 2021-07-28 DIAGNOSIS — Z87891 Personal history of nicotine dependence: Secondary | ICD-10-CM

## 2021-07-28 DIAGNOSIS — Z955 Presence of coronary angioplasty implant and graft: Secondary | ICD-10-CM | POA: Diagnosis not present

## 2021-07-28 DIAGNOSIS — I5032 Chronic diastolic (congestive) heart failure: Secondary | ICD-10-CM | POA: Diagnosis not present

## 2021-07-28 DIAGNOSIS — K219 Gastro-esophageal reflux disease without esophagitis: Secondary | ICD-10-CM | POA: Insufficient documentation

## 2021-07-28 DIAGNOSIS — R310 Gross hematuria: Secondary | ICD-10-CM | POA: Diagnosis not present

## 2021-07-28 DIAGNOSIS — M069 Rheumatoid arthritis, unspecified: Secondary | ICD-10-CM | POA: Diagnosis not present

## 2021-07-28 DIAGNOSIS — I110 Hypertensive heart disease with heart failure: Secondary | ICD-10-CM

## 2021-07-28 DIAGNOSIS — Z87442 Personal history of urinary calculi: Secondary | ICD-10-CM | POA: Diagnosis not present

## 2021-07-28 DIAGNOSIS — Z8551 Personal history of malignant neoplasm of bladder: Secondary | ICD-10-CM | POA: Diagnosis not present

## 2021-07-28 DIAGNOSIS — I130 Hypertensive heart and chronic kidney disease with heart failure and stage 1 through stage 4 chronic kidney disease, or unspecified chronic kidney disease: Secondary | ICD-10-CM | POA: Insufficient documentation

## 2021-07-28 DIAGNOSIS — E1122 Type 2 diabetes mellitus with diabetic chronic kidney disease: Secondary | ICD-10-CM | POA: Diagnosis not present

## 2021-07-28 DIAGNOSIS — N131 Hydronephrosis with ureteral stricture, not elsewhere classified: Secondary | ICD-10-CM | POA: Diagnosis not present

## 2021-07-28 DIAGNOSIS — M25551 Pain in right hip: Secondary | ICD-10-CM | POA: Diagnosis not present

## 2021-07-28 HISTORY — PX: CYSTOSCOPY WITH RETROGRADE PYELOGRAM, URETEROSCOPY AND STENT PLACEMENT: SHX5789

## 2021-07-28 LAB — GLUCOSE, CAPILLARY
Glucose-Capillary: 178 mg/dL — ABNORMAL HIGH (ref 70–99)
Glucose-Capillary: 181 mg/dL — ABNORMAL HIGH (ref 70–99)

## 2021-07-28 SURGERY — CYSTOURETEROSCOPY, WITH RETROGRADE PYELOGRAM AND STENT INSERTION
Anesthesia: General | Site: Urethra | Laterality: Right

## 2021-07-28 MED ORDER — IOHEXOL 300 MG/ML  SOLN
INTRAMUSCULAR | Status: DC | PRN
  Administered 2021-07-28: 10 mL

## 2021-07-28 MED ORDER — ACETAMINOPHEN 500 MG PO TABS
1000.0000 mg | ORAL_TABLET | Freq: Once | ORAL | Status: AC
  Administered 2021-07-28: 1000 mg via ORAL
  Filled 2021-07-28: qty 2

## 2021-07-28 MED ORDER — SODIUM CHLORIDE 0.9 % IR SOLN
Status: DC | PRN
  Administered 2021-07-28: 6000 mL

## 2021-07-28 MED ORDER — PHENYLEPHRINE 80 MCG/ML (10ML) SYRINGE FOR IV PUSH (FOR BLOOD PRESSURE SUPPORT)
PREFILLED_SYRINGE | INTRAVENOUS | Status: DC | PRN
  Administered 2021-07-28 (×2): 40 ug via INTRAVENOUS

## 2021-07-28 MED ORDER — CEFAZOLIN SODIUM-DEXTROSE 1-4 GM/50ML-% IV SOLN
1.0000 g | Freq: Once | INTRAVENOUS | Status: AC
  Administered 2021-07-28: 1 g via INTRAVENOUS
  Filled 2021-07-28: qty 50

## 2021-07-28 MED ORDER — LIDOCAINE 2% (20 MG/ML) 5 ML SYRINGE
INTRAMUSCULAR | Status: DC | PRN
Start: 2021-07-28 — End: 2021-07-28
  Administered 2021-07-28: 80 mg via INTRAVENOUS

## 2021-07-28 MED ORDER — CEFUROXIME AXETIL 250 MG PO TABS
250.0000 mg | ORAL_TABLET | Freq: Two times a day (BID) | ORAL | 0 refills | Status: DC
Start: 2021-07-28 — End: 2021-10-07

## 2021-07-28 MED ORDER — ONDANSETRON HCL 4 MG/2ML IJ SOLN
INTRAMUSCULAR | Status: DC | PRN
Start: 2021-07-28 — End: 2021-07-28
  Administered 2021-07-28: 4 mg via INTRAVENOUS

## 2021-07-28 MED ORDER — CHLORHEXIDINE GLUCONATE 0.12 % MT SOLN
15.0000 mL | Freq: Once | OROMUCOSAL | Status: AC
  Administered 2021-07-28: 15 mL via OROMUCOSAL

## 2021-07-28 MED ORDER — SODIUM CHLORIDE (PF) 0.9 % IJ SOLN
INTRAMUSCULAR | Status: AC
  Filled 2021-07-28: qty 10

## 2021-07-28 MED ORDER — OXYCODONE HCL 5 MG/5ML PO SOLN: 5.0000 mg | Freq: Once | ORAL | Status: DC | PRN

## 2021-07-28 MED ORDER — OXYCODONE HCL 5 MG PO TABS: 5.0000 mg | ORAL_TABLET | Freq: Once | ORAL | Status: DC | PRN

## 2021-07-28 MED ORDER — FENTANYL CITRATE PF 50 MCG/ML IJ SOSY: 25.0000 ug | PREFILLED_SYRINGE | INTRAMUSCULAR | Status: DC | PRN

## 2021-07-28 MED ORDER — LACTATED RINGERS IV SOLN
INTRAVENOUS | Status: DC
Start: 2021-07-28 — End: 2021-07-28

## 2021-07-28 MED ORDER — FENTANYL CITRATE (PF) 100 MCG/2ML IJ SOLN
INTRAMUSCULAR | Status: DC | PRN
  Administered 2021-07-28 (×2): 25 ug via INTRAVENOUS

## 2021-07-28 MED ORDER — CEFAZOLIN SODIUM 1 G IJ SOLR
INTRAMUSCULAR | Status: AC
  Filled 2021-07-28: qty 10

## 2021-07-28 MED ORDER — ORAL CARE MOUTH RINSE: 15.0000 mL | Freq: Once | OROMUCOSAL | Status: AC

## 2021-07-28 MED ORDER — PROPOFOL 10 MG/ML IV BOLUS
INTRAVENOUS | Status: AC
  Filled 2021-07-28: qty 20

## 2021-07-28 MED ORDER — CEFAZOLIN SODIUM-DEXTROSE 1-4 GM/50ML-% IV SOLN
INTRAVENOUS | Status: DC | PRN
  Administered 2021-07-28: 1 g via INTRAVENOUS

## 2021-07-28 MED ORDER — DEXAMETHASONE SODIUM PHOSPHATE 10 MG/ML IJ SOLN
INTRAMUSCULAR | Status: DC | PRN
  Administered 2021-07-28: 4 mg via INTRAVENOUS

## 2021-07-28 MED ORDER — PHENYLEPHRINE 80 MCG/ML (10ML) SYRINGE FOR IV PUSH (FOR BLOOD PRESSURE SUPPORT)
PREFILLED_SYRINGE | INTRAVENOUS | Status: AC
  Filled 2021-07-28: qty 20

## 2021-07-28 MED ORDER — FENTANYL CITRATE (PF) 100 MCG/2ML IJ SOLN
INTRAMUSCULAR | Status: AC
Start: 2021-07-28 — End: ?
  Filled 2021-07-28: qty 2

## 2021-07-28 MED ORDER — DEXAMETHASONE SODIUM PHOSPHATE 10 MG/ML IJ SOLN
INTRAMUSCULAR | Status: AC
  Filled 2021-07-28: qty 1

## 2021-07-28 MED ORDER — EPHEDRINE 5 MG/ML INJ
INTRAVENOUS | Status: AC
  Filled 2021-07-28: qty 5

## 2021-07-28 MED ORDER — SODIUM CHLORIDE 0.9 % IR SOLN
Status: DC | PRN
  Administered 2021-07-28: 1000 mL

## 2021-07-28 MED ORDER — PROPOFOL 10 MG/ML IV BOLUS
INTRAVENOUS | Status: DC | PRN
  Administered 2021-07-28: 130 mg via INTRAVENOUS

## 2021-07-28 MED ORDER — ONDANSETRON HCL 4 MG/2ML IJ SOLN
INTRAMUSCULAR | Status: AC
  Filled 2021-07-28: qty 2

## 2021-07-28 MED ORDER — FLUCONAZOLE IN SODIUM CHLORIDE 200-0.9 MG/100ML-% IV SOLN
200.0000 mg | Freq: Once | INTRAVENOUS | Status: AC
  Administered 2021-07-28: 200 mg via INTRAVENOUS
  Filled 2021-07-28: qty 100

## 2021-07-28 SURGICAL SUPPLY — 21 items
BAG URO CATCHER STRL LF (MISCELLANEOUS) ×3 IMPLANT
BASKET ZERO TIP NITINOL 2.4FR (BASKET) IMPLANT
CATH URETL OPEN 5X70 (CATHETERS) ×3 IMPLANT
CLOTH BEACON ORANGE TIMEOUT ST (SAFETY) ×3 IMPLANT
DRSG TEGADERM 2-3/8X2-3/4 SM (GAUZE/BANDAGES/DRESSINGS) IMPLANT
EXTRACTOR STONE 1.7FRX115CM (UROLOGICAL SUPPLIES) IMPLANT
GLOVE BIO SURGEON STRL SZ 6.5 (GLOVE) ×3 IMPLANT
GUIDEWIRE STR DUAL SENSOR (WIRE) ×3 IMPLANT
KIT TURNOVER KIT A (KITS) IMPLANT
LASER FIB FLEXIVA PULSE ID 365 (Laser) ×1 IMPLANT
LOOP CUT BIPOLAR 24F LRG (ELECTROSURGICAL) ×2 IMPLANT
MANIFOLD NEPTUNE II (INSTRUMENTS) ×3 IMPLANT
PACK CYSTO (CUSTOM PROCEDURE TRAY) ×3 IMPLANT
SHEATH NAVIGATOR HD 11/13X28 (SHEATH) IMPLANT
SHEATH NAVIGATOR HD 11/13X36 (SHEATH) IMPLANT
SYR TOOMEY IRRIG 70ML (MISCELLANEOUS) ×3
SYRINGE TOOMEY IRRIG 70ML (MISCELLANEOUS) ×1 IMPLANT
TRACTIP FLEXIVA PULS ID 200XHI (Laser) ×1 IMPLANT
TRACTIP FLEXIVA PULSE ID 200 (Laser)
TUBING CONNECTING 10 (TUBING) ×3 IMPLANT
TUBING UROLOGY SET (TUBING) ×3 IMPLANT

## 2021-07-28 NOTE — Anesthesia Procedure Notes (Signed)
Procedure Name: LMA Insertion ?Date/Time: 07/28/2021 10:32 AM ?Performed by: Eben Burow, CRNA ?Pre-anesthesia Checklist: Patient identified, Emergency Drugs available, Suction available, Patient being monitored and Timeout performed ?Patient Re-evaluated:Patient Re-evaluated prior to induction ?Oxygen Delivery Method: Circle system utilized ?Preoxygenation: Pre-oxygenation with 100% oxygen ?Induction Type: IV induction ?Ventilation: Mask ventilation without difficulty ?LMA: LMA inserted and LMA with gastric port inserted ?LMA Size: 4.0 ?Number of attempts: 1 ?Tube secured with: Tape ?Dental Injury: Teeth and Oropharynx as per pre-operative assessment  ? ? ? ? ?

## 2021-07-28 NOTE — Op Note (Signed)
Preoperative diagnosis: right ureteral calculus, hx of bladder cancer ? ?Postoperative diagnosis: erythema of bladder mucosa, right hydronephrosis ? ?Procedure: ? ?Cystoscopy ?Right diagnostic ureteroscopy ?right 6F x 26 ureteral stent placement - with tether ?right retrograde pyelography with interpretation ? ?Surgeon: Jacalyn Lefevre, MD ? ?Anesthesia: General ? ?Complications: None ? ?Intraoperative findings:  ?Normal urethra ?Bilateral lobe hypertrophy prostatic urethra with median lobe ?Bilateral orthotropic ureteral orifices ?Right moderate hydronephrosis to level of bladder with tortuous ureter ?Severe trabeculations, prior TUR scar on posterior superior wall, mild erythema surrounding this area ? ?EBL: Minimal ? ?Specimens: ?none ? ?Disposition of specimens: [facility] for stone analysis ? ?Indication: [name] is a 88 y.o.   patient with a 8 mm right ureteral stone and associated right symptoms who underwent ESWL and had concern for residual distal ureteral calculi fragments as well as history of bladder cancer. After reviewing the management options for treatment, the patient elected to proceed with the above surgical procedure(s). We have discussed the potential benefits and risks of the procedure, side effects of the proposed treatment, the likelihood of the patient achieving the goals of the procedure, and any potential problems that might occur during the procedure or recuperation. Informed consent has been obtained. ? ? ?Description of procedure: ? ?The patient was taken to the operating room and general anesthesia was induced.  The patient was placed in the dorsal lithotomy position, prepped and draped in the usual sterile fashion, and preoperative antibiotics were administered. A preoperative time-out was performed.  ? ?Cystourethroscopy was performed.  The patient?s urethra was examined and demonstrated bilobar prostatic hypertrophy with a median lobe. The bladder was then  systematically examined in its entirety. There was no evidence for any bladder tumors, stones, or other mucosal pathology.   ? ?The area of prior TUR scar on the posterior bladder mucosa was slightly erythematous.  This area was then fulgurated with the bipolar loop using the resectoscope. ? ?Next, attention then turned to the right ureteral orifice and a ureteral catheter was used to intubate the ureteral orifice.  Omnipaque contrast was injected through the ureteral catheter and a retrograde pyelogram was performed with findings as dictated above. ? ?A 0.38 sensor guidewire was then advanced up the right ureter into the renal pelvis under fluoroscopic guidance. The 6 Fr semirigid ureteroscope was then advanced into the ureter next to the guidewire to the UPJ and no stone was encountered.  The ureteroscope was removed. ? ? ?The wire was then backloaded through the cystoscope and a ureteral stent was advance over the wire using Seldinger technique.  The stent was positioned appropriately under fluoroscopic and cystoscopic guidance.  The wire was then removed with an adequate stent curl noted in the renal pelvis as well as in the bladder. ? ?The bladder was then emptied and the procedure ended.  The patient appeared to tolerate the procedure well and without complications.  The patient was able to be awakened and transferred to the recovery unit in satisfactory condition.  ? ?Disposition: The tether of the stent was left on and secured to the ventral aspect of the patient's penis.  Instructions for removing the stent have been provided to the patient.   ?

## 2021-07-28 NOTE — Interval H&P Note (Signed)
History and Physical Interval Note: ?Patient initially canceled his previous surgery due to feeling tired and unable to get out of bed.  It is now been rescheduled.  Urine culture from the office was reviewed which showed less than 30,000 and of yeast.  He will be given fluconazole and Ancef for prophylaxis.  Overnight he developed right hip pain and was unable to walk into the hospital.  The anesthesiologist has ordered a hip x-ray to ensure no fracture prior to putting patient in lithotomy.  I have also added to the consent possible bladder biopsy with fulguration given his history of bladder cancer and need for surveillance cystoscopy. ? ?07/28/2021 ?9:57 AM ? ?[name]  has presented today for surgery, with the diagnosis of URETERAL CALCULUS.  The various methods of treatment have been discussed with the patient and family. After consideration of risks, benefits and other options for treatment, the patient has consented to  Procedure(s): ?CYSTOSCOPY WITH RETROGRADE PYELOGRAM, URETEROSCOPY AND STENT PLACEMENT (Right) ?HOLMIUM LASER APPLICATION (Right) as a surgical intervention.  The patient's history has been reviewed, patient examined, no change in status, stable for surgery.  I have reviewed the patient's chart and labs.  Questions were answered to the patient's satisfaction.   ? ? ?Adelyne Marchese D Ritchie Klee ? ? ?

## 2021-07-28 NOTE — Transfer of Care (Signed)
Immediate Anesthesia Transfer of Care Note ? ?Patient: [name] ? ?Procedure(s) Performed: CYSTOSCOPY WITH FULGERATION, STENT PLACEMENT, RETROGRADE PYELOGRAM,  DIAGNOSTIC URETEROSCOPY (Right: Urethra) ? ?Patient Location: PACU ? ?Anesthesia Type:General ? ?Level of Consciousness: awake, drowsy and patient cooperative ? ?Airway & Oxygen Therapy: Patient Spontanous Breathing and Patient connected to face mask oxygen ? ?Post-op Assessment: Report given to RN and Post -op Vital signs reviewed and stable ? ?Post vital signs: Reviewed and stable ? ?Last Vitals:  ?Vitals Value Taken Time  ?BP 128/63 07/28/21 1121  ?Temp    ?Pulse 58 07/28/21 1124  ?Resp 14 07/28/21 1124  ?SpO2 100 % 07/28/21 1124  ?Vitals shown include unvalidated device data. ? ?Last Pain:  ?Vitals:  ? 07/28/21 0856  ?TempSrc:   ?PainSc: 4   ?   ? ?Patients Stated Pain Goal: 3 (07/28/21 0856) ? ?Complications: No notable events documented. ?

## 2021-07-28 NOTE — Discharge Instructions (Signed)
DISCHARGE INSTRUCTIONS FOR KIDNEY STONE/URETERAL STENT  ? ?MEDICATIONS:  ?1. Resume all your other meds from home  ?2. AZO over the counter can help with the burning/stinging when you urinate. ?3. Cephalexin is to help prevent UTI ? ? ?ACTIVITY:  ?1. No strenuous activity x 1week  ?2. No driving while on narcotic pain medications  ?3. Drink plenty of water  ?4. Continue to walk at home - you can still get blood clots when you are at home, so keep active, but don't over do it.  ?5. May return to work/school tomorrow or when you feel ready  ? ?BATHING:  ?1. You can shower and we recommend daily showers  ?2. You have a string coming from your urethra: The stent string is attached to your ureteral stent. Do not pull on this.  ? ?SIGNS/SYMPTOMS TO CALL:  ?Please call us if you have a fever greater than 101.5, uncontrolled nausea/vomiting, uncontrolled pain, dizziness, unable to urinate, bloody urine, chest pain, shortness of breath, leg swelling, leg pain, redness around wound, drainage from wound, or any other concerns or questions.  ? ?You can reach [place] at [phone].  ? ?FOLLOW-UP:  ?1. You have a string attached to your stent, you may remove it on Friday, May 28 in the morning. To do this, pull the string until the stent is completely removed. You may feel an odd sensation in your back.  ?

## 2021-07-28 NOTE — Anesthesia Postprocedure Evaluation (Signed)
Anesthesia Post Note ? ?Patient: [name] ? ?Procedure(s) Performed: CYSTOSCOPY WITH FULGERATION, STENT PLACEMENT, RETROGRADE PYELOGRAM,  DIAGNOSTIC URETEROSCOPY (Right: Urethra) ? ?  ? ?Patient location during evaluation: PACU ?Anesthesia Type: General ?Level of consciousness: awake and alert ?Pain management: pain level controlled ?Vital Signs Assessment: post-procedure vital signs reviewed and stable ?Respiratory status: spontaneous breathing, nonlabored ventilation and respiratory function stable ?Cardiovascular status: blood pressure returned to baseline ?Postop Assessment: no apparent nausea or vomiting ?Anesthetic complications: no ? ? ?No notable events documented. ? ?Last Vitals:  ?Vitals:  ? 07/28/21 1201 07/28/21 1300  ?BP: 137/70 132/65  ?Pulse: 72 72  ?Resp: 17 16  ?Temp:    ?SpO2: 96% 96%  ?  ?Last Pain:  ?Vitals:  ? 07/28/21 1300  ?TempSrc:   ?PainSc: 0-No pain  ? ? ?  ?  ?  ?  ?  ?  ? ?Marthenia Rolling ? ? ? ? ?

## 2021-07-28 NOTE — H&P (Signed)
CC/HPI: CC/HPI: cc: Bladder cancer, UTIs, urolithiasis  ? ? 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 He has completed induction BCG. Surveillance cystoscopy was difficult to visualize bladder mucosa due to debris. He underwent cystoscopy with bladder biopsy and fulguration. Bladder biopsy showed scant fibrous tissue. No notable recurrence noted. In the interim patient developed a distal ureteral calculus and underwent ESWL in February 2023. He did pass stones after that but is still experiencing some right flank discomfort. He is also experiencing dysuria. Dysuria has been pretty constant since being diagnosed with bladder cancer. Urinalysis today shows significant pyuria.  ? ?06/24/2021: 88 year old man with a history of high-grade Ta bladder cancer diagnosed in July 2022 also with distal ureteral calculus who underwent ESWL last month comes in with persistent right flank pain. Limited renal ultrasound today shows mild to moderate hydronephrosis. KUB shows that the stone fragments have not moved. Patient has persistent pyuria. We will postpone cystoscopy today in favor of doing in the operating room when we do diagnostic right ureteroscopy with possible laser lithotripsy.  ? ?  ?ALLERGIES: No Known Drug Allergies ?  ? ?MEDICATIONS: Aspirin  ?Doxycycline Hyclate 100 mg tablet 1 tablet PO BID  ?Finasteride 5 mg tablet 1 tablet PO Daily  ?Plavix 75 mg tablet  ?Amlodipine Besylate 5 mg tablet  ?Coreg 6.25 mg tablet  ?Flomax 0.4 mg capsule  ?Lasix 40 mg tablet  ?Lyrica 50 mg capsule  ?Nitrostat  ?Protonix 40 mg tablet, delayed release  ?Rybelsus 3 mg tablet  ?Vitamin C  ?  ? ?GU PSH: Bladder Instill AntiCA Agent - 01/26/2021, 01/19/2021, 01/12/2021, 12/29/2020, 12/01/2020, 11/24/2020, 11/10/2020, 11/03/2020 ?Cystoscopy - 02/09/2021, 10/01/2020 ?Cystoscopy TURBT <2 cm - 03/17/2021 ?Cystoscopy TURBT 2-5 cm - 10/08/2020 ?ESWL -  05/11/2021 ? ?  ? ?NON-GU PSH: Coronary Artery Bypass Grafting ?Hand/finger Surgery, Bilateral ?Knee Arthroscopy, Right ?Shoulder Arthroscopy/surgery, Left ? ?  ? ?GU PMH: Bladder Cancer Posterior - 06/17/2021, - 03/23/2021, - 02/09/2021, - 01/26/2021, - 01/19/2021, - 01/12/2021, - 01/05/2021, - 12/29/2020, - 12/01/2020, - 11/24/2020, - 11/10/2020, - 11/03/2020, - 10/17/2020 ?Dysuria - 06/17/2021, - 05/22/2021 ?Ureteral calculus - 06/17/2021, - 05/22/2021, - 04/23/2021, - 04/22/2021 ?Urinary Frequency - 05/22/2021 ?Gross hematuria - 04/22/2021, - 10/01/2020, - 09/23/2020 ?Bladder tumor/neoplasm - 10/01/2020, - 09/23/2020 ?Chronic kidney disease stage 4 (GFR 15-30) ?Unil Inguinal Hernia W/O obst or gang,non-recurrent ?  ? ?NON-GU PMH: Encounter for antineoplastic chemotherapy - 11/03/2020 ?Achalasia of cardia ?Arthritis ?Atherosclerotic heart disease of native coronary artery with unspecified angina pectoris ?Bilateral primary osteoarthritis of knee ?Cardiac murmur, unspecified ?Chronic diastolic (congestive) heart failure ?Constipation, unspecified ?Diabetes Type 2 ?Diverticulosis of large intestine without perforation or abscess without bleeding ?Dysphagia, unspecified ?GERD ?Glaucoma ?Interstitial pulmonary disease, unspecified ?Iron deficiency anemia, unspecified ?Neuralgia and neuritis, unspecified ?Obstructive sleep apnea (adult) (pediatric) ?Opioid dependence, uncomplicated ?Orthostatic hypotension ?Polyneuropathy, unspecified ?Presence of right artificial knee joint ?Rheumatoid arthritis with rheumatoid factor, unspecified ?Ventricular tachycardia ?  ? ?FAMILY HISTORY: 3 daughters - Runs in Family ?Alcoholism - Sister ?Colon Cancer - Brother ?copd - Mother ?Diabetes - Brother ?Heart Disease - Father ?Prostate Cancer - Brother  ? ?SOCIAL HISTORY: Marital Status: Married ?Preferred Language: [place]; Ethnicity: Not Hispanic Or Latino; Race: White ?Current Smoking Status: Patient smokes occasionally.  ? ?Tobacco Use Assessment Completed:  Used Tobacco in last 30 days? ?Has never drank.  ?Drinks 2 caffeinated drinks per day. ?  ? ?REVIEW OF SYSTEMS:    ?GU Review Male:  Patient reports burning/ pain with urination. Patient denies frequent urination, hard to postpone urination, get up at night to urinate, leakage of urine, stream starts and stops, trouble starting your stream, have to strain to urinate , erection problems, and penile pain.  ?Gastrointestinal (Upper):   Patient denies nausea, vomiting, and indigestion/ heartburn.  ?Gastrointestinal (Lower):   Patient denies diarrhea and constipation.  ?Constitutional:   Patient denies fever, night sweats, weight loss, and fatigue.  ?Skin:   Patient denies skin rash/ lesion and itching.  ?Eyes:   Patient denies blurred vision and double vision.  ?Ears/ Nose/ Throat:   Patient denies sore throat and sinus problems.  ?Hematologic/Lymphatic:   Patient denies swollen glands and easy bruising.  ?Cardiovascular:   Patient denies leg swelling and chest pains.  ?Respiratory:   Patient denies cough and shortness of breath.  ?Endocrine:   Patient denies excessive thirst.  ?Musculoskeletal:   Patient denies back pain and joint pain.  ?Neurological:   Patient denies headaches and dizziness.  ?Psychologic:   Patient denies depression and anxiety.  ? ?VITAL SIGNS: None  ? ?MULTI-SYSTEM PHYSICAL EXAMINATION:    ?Constitutional: Well-nourished. No physical deformities. Normally developed. Good grooming.  ?Neck: Neck symmetrical, not swollen. Normal tracheal position.  ?Respiratory: No labored breathing, no use of accessory muscles.   ?Skin: No paleness, no jaundice, no cyanosis. No lesion, no ulcer, no rash.  ?Neurologic / Psychiatric: Oriented to time, oriented to place, oriented to person. No depression, no anxiety, no agitation.  ?Eyes: Normal conjunctivae. Normal eyelids.  ?Ears, Nose, Mouth, and Throat: Left ear no scars, no lesions, no masses. Right ear no scars, no lesions, no masses. Nose no scars, no lesions,  no masses. Normal hearing. Normal lips.  ?Musculoskeletal: Normal gait and station of head and neck.  ? ?  ?Complexity of Data:  ?Records Review:   Previous Patient Records, POC Tool  ?Urine Test Review:   Urinalysis  ?X-Ray Review: KUB: Reviewed Films. Discussed With Patient. KUB reveals calcifications in same location in the right distal ureter that do not appear to have moved ?Renal Ultrasound (Limited): Reviewed Films. Discussed With Patient.  ?  ? ?PROCEDURES:    ?     Renal Ultrasound (Limited) - 26834  ?Kidney: Right ?Length: 12.8 cm Depth: 6.2 cm ?Cortical Width: 1.2 cm Width: 4.5 cm ? ?  ?Right Kidney/Ureter:  Mild hydro/hydroureter  ?Bladder:  PVR 22.9 ml  ?  ?  ?Patient confirmed No Neulasta OnPro Device.  ? ? ?  ? ?     KUB - 19622  ?A single view of the abdomen is obtained.  ?  ?  ?Patient confirmed No Neulasta OnPro Device.  ?  ? ? ?     Urinalysis w/Scope ?Dipstick Dipstick Cont'd Micro  ?Color: Straw Bilirubin: Neg mg/dL WBC/hpf: >60/hpf  ?Appearance: Turbid Ketones: Neg mg/dL RBC/hpf: 3 - 10/hpf  ?Specific Gravity: 1.020 Blood: 2+ ery/uL Bacteria: NS (Not Seen)  ?pH: 5.5 Protein: 2+ mg/dL Cystals: NS (Not Seen)  ?Glucose: Neg mg/dL Urobilinogen: 0.2 mg/dL Casts: NS (Not Seen)  ?  Nitrites: Neg Trichomonas: Not Present  ?  Leukocyte Esterase: 3+ leu/uL Mucous: Present  ?    Epithelial Cells: NS (Not Seen)  ?    Yeast: NS (Not Seen)  ?    Sperm: Not Present  ? ? ?ASSESSMENT:  ?    ICD-10 Details  ?1 GU:   Bladder Cancer Posterior - C67.4 Chronic, Stable  ?2   Ureteral calculus - N20.1  Acute, Uncomplicated  ?3   Hydronephrosis - K59.9 Acute, Uncomplicated  ? ?PLAN:    ? ?      Orders ?Labs CULTURE, URINE  ?X-Rays: KUB  ?  Renal Ultrasound - right  ? ? ?      Schedule ? ? ?      Document ?Letter(s):  Created for Patient: Clinical Summary  ? ? ?     Notes:   1. Right hydronephrosis with distal ureteral calculus: Patient likely with retained distal right ureteral calculus fragments. He is having  persistent pain and would recommend cystoscopy with ureteroscopy, retrograde pyelogram, laser lithotripsy and stent placement. Risks and benefits of the procedure were discussed with the patient and his family in

## 2021-07-29 ENCOUNTER — Ambulatory Visit: Payer: Self-pay

## 2021-07-29 ENCOUNTER — Encounter (HOSPITAL_COMMUNITY): Payer: Self-pay | Admitting: Urology

## 2021-07-29 ENCOUNTER — Ambulatory Visit: Payer: Medicare Other | Admitting: Rheumatology

## 2021-07-29 ENCOUNTER — Telehealth: Payer: Self-pay | Admitting: Cardiovascular Disease

## 2021-07-29 VITALS — BP 128/71 | HR 87 | Ht 72.0 in | Wt 185.6 lb

## 2021-07-29 DIAGNOSIS — N184 Chronic kidney disease, stage 4 (severe): Secondary | ICD-10-CM

## 2021-07-29 DIAGNOSIS — Z96651 Presence of right artificial knee joint: Secondary | ICD-10-CM

## 2021-07-29 DIAGNOSIS — M47816 Spondylosis without myelopathy or radiculopathy, lumbar region: Secondary | ICD-10-CM

## 2021-07-29 DIAGNOSIS — M79601 Pain in right arm: Secondary | ICD-10-CM

## 2021-07-29 DIAGNOSIS — Z8679 Personal history of other diseases of the circulatory system: Secondary | ICD-10-CM | POA: Diagnosis not present

## 2021-07-29 DIAGNOSIS — M1712 Unilateral primary osteoarthritis, left knee: Secondary | ICD-10-CM | POA: Diagnosis not present

## 2021-07-29 DIAGNOSIS — Z79899 Other long term (current) drug therapy: Secondary | ICD-10-CM

## 2021-07-29 DIAGNOSIS — Z8719 Personal history of other diseases of the digestive system: Secondary | ICD-10-CM | POA: Diagnosis not present

## 2021-07-29 DIAGNOSIS — Z8669 Personal history of other diseases of the nervous system and sense organs: Secondary | ICD-10-CM

## 2021-07-29 DIAGNOSIS — I5032 Chronic diastolic (congestive) heart failure: Secondary | ICD-10-CM

## 2021-07-29 DIAGNOSIS — Z8639 Personal history of other endocrine, nutritional and metabolic disease: Secondary | ICD-10-CM

## 2021-07-29 DIAGNOSIS — M79644 Pain in right finger(s): Secondary | ICD-10-CM | POA: Diagnosis not present

## 2021-07-29 DIAGNOSIS — J849 Interstitial pulmonary disease, unspecified: Secondary | ICD-10-CM | POA: Diagnosis not present

## 2021-07-29 DIAGNOSIS — M25511 Pain in right shoulder: Secondary | ICD-10-CM

## 2021-07-29 DIAGNOSIS — M059 Rheumatoid arthritis with rheumatoid factor, unspecified: Secondary | ICD-10-CM

## 2021-07-29 NOTE — Telephone Encounter (Signed)
New Message: ? ? ? ?Patient wants to know if it is alright for him to take Bufferin? ? ? ? ?Pt c/o medication issue: ? ?1. Name of Medication: Bufferin ? ?2. How are you currently taking this medication (dosage and times per day)?  ? ?3. Are you having a reaction (difficulty breathing--STAT)?  ? ?4. What is your medication issue? Caan he take this medicine with his condition and his other medicine ? ?

## 2021-07-29 NOTE — Telephone Encounter (Signed)
Returned call to Pt. ? ?Advised to avoid ibuprofen.  Would be safer to take tylenol for pain. ? ?Pt indicates understanding. ?

## 2021-07-29 NOTE — Patient Instructions (Signed)
Hydroxychloroquine Tablets ?What is this medication? ?HYDROXYCHLOROQUINE (hye drox ee KLOR oh kwin) treats autoimmune conditions, such as rheumatoid arthritis and lupus. It works by slowing down an overactive immune system. It may also be used to prevent and treat malaria. It works by killing the parasite that causes malaria. It belongs to a group of medications called DMARDs. ?This medicine may be used for other purposes; ask your health care provider or pharmacist if you have questions. ?COMMON BRAND NAME(S): Plaquenil, Quineprox ?What should I tell my care team before I take this medication? ?They need to know if you have any of these conditions: ?Diabetes ?Eye disease, vision problems ?G6PD deficiency ?Heart disease ?History of irregular heartbeat ?If you often drink alcohol ?Kidney disease ?Liver disease ?Porphyria ?Psoriasis ?An unusual or allergic reaction to chloroquine, hydroxychloroquine, other medications, foods, dyes, or preservatives ?Pregnant or trying to get pregnant ?Breast-feeding ?How should I use this medication? ?Take this medication by mouth with a glass of water. Take it as directed on the prescription label. Do not cut, crush or chew this medication. Swallow the tablets whole. Take it with food. Do not take it more than directed. Take all of this medication unless your care team tells you to stop it early. Keep taking it even if you think you are better. ?Take products with antacids in them at a different time of day than this medication. Take this medication 4 hours before or 4 hours after antacids. Talk to your care team if you have questions. ?Talk to your care team about the use of this medication in children. While this medication may be prescribed for selected conditions, precautions do apply. ?Overdosage: If you think you have taken too much of this medicine contact a poison control center or emergency room at once. ?NOTE: This medicine is only for you. Do not share this medicine with  others. ?What if I miss a dose? ?If you miss a dose, take it as soon as you can. If it is almost time for your next dose, take only that dose. Do not take double or extra doses. ?What may interact with this medication? ?Do not take this medication with any of the following: ?Cisapride ?Dronedarone ?Pimozide ?Thioridazine ?This medication may also interact with the following: ?Ampicillin ?Antacids ?Cimetidine ?Cyclosporine ?Digoxin ?Kaolin ?Medications for diabetes, like insulin, glipizide, glyburide ?Medications for seizures like carbamazepine, phenobarbital, phenytoin ?Mefloquine ?Methotrexate ?Other medications that prolong the QT interval (cause an abnormal heart rhythm) ?Praziquantel ?This list may not describe all possible interactions. Give your health care provider a list of all the medicines, herbs, non-prescription drugs, or dietary supplements you use. Also tell them if you smoke, drink alcohol, or use illegal drugs. Some items may interact with your medicine. ?What should I watch for while using this medication? ?Visit your care team for regular checks on your progress. Tell your care team if your symptoms do not start to get better or if they get worse. ?You may need blood work done while you are taking this medication. If you take other medications that can affect heart rhythm, you may need more testing. Talk to your care team if you have questions. ?Your vision may be tested before and during use of this medication. Tell your care team right away if you have any change in your eyesight. ?This medication may cause serious skin reactions. They can happen weeks to months after starting the medication. Contact your care team right away if you notice fevers or flu-like symptoms with a rash. The   rash may be red or purple and then turn into blisters or peeling of the skin. Or, you might notice a red rash with swelling of the face, lips or lymph nodes in your neck or under your arms. ?If you or your family  notice any changes in your behavior, such as new or worsening depression, thoughts of harming yourself, anxiety, or other unusual or disturbing thoughts, or memory loss, call your care team right away. ?What side effects may I notice from receiving this medication? ?Side effects that you should report to your care team as soon as possible: ?Allergic reactions--skin rash, itching, hives, swelling of the face, lips, tongue, or throat ?Aplastic anemia--unusual weakness or fatigue, dizziness, headache, trouble breathing, increased bleeding or bruising ?Change in vision ?Heart rhythm changes--fast or irregular heartbeat, dizziness, feeling faint or lightheaded, chest pain, trouble breathing ?Infection--fever, chills, cough, or sore throat ?Low blood sugar (hypoglycemia)--tremors or shaking, anxiety, sweating, cold or clammy skin, confusion, dizziness, rapid heartbeat ?Muscle injury--unusual weakness or fatigue, muscle pain, dark yellow or brown urine, decrease in amount of urine ?Pain, tingling, or numbness in the hands or feet ?Rash, fever, and swollen lymph nodes ?Redness, blistering, peeling, or loosening of the skin, including inside the mouth ?Thoughts of suicide or self-harm, worsening mood, or feelings of depression ?Unusual bruising or bleeding ?Side effects that usually do not require medical attention (report to your care team if they continue or are bothersome): ?Diarrhea ?Headache ?Nausea ?Stomach pain ?Vomiting ?This list may not describe all possible side effects. Call your doctor for medical advice about side effects. You may report side effects to FDA at 1-800-FDA-1088. ?Where should I keep my medication? ?Keep out of the reach of children and pets. ?Store at room temperature up to 30 degrees C (86 degrees F). Protect from light. Get rid of any unused medication after the expiration date. ?To get rid of medications that are no longer needed or have expired: ?Take the medication to a medication take-back  program. Check with your pharmacy or law enforcement to find a location. ?If you cannot return the medication, check the label or package insert to see if the medication should be thrown out in the garbage or flushed down the toilet. If you are not sure, ask your care team. If it is safe to put it in the trash, empty the medication out of the container. Mix the medication with cat litter, dirt, coffee grounds, or other unwanted substance. Seal the mixture in a bag or container. Put it in the trash. ?NOTE: This sheet is a summary. It may not cover all possible information. If you have questions about this medicine, talk to your doctor, pharmacist, or health care provider. ?? 2023 Elsevier/Gold Standard (2020-08-07 00:00:00) ? ?

## 2021-07-31 ENCOUNTER — Encounter: Payer: Self-pay | Admitting: Rheumatology

## 2021-08-01 LAB — GLUCOSE 6 PHOSPHATE DEHYDROGENASE: G-6PDH: 17.9 U/g Hgb (ref 7.0–20.5)

## 2021-08-01 LAB — CYCLIC CITRUL PEPTIDE ANTIBODY, IGG: Cyclic Citrullin Peptide Ab: >250 UNITS — ABNORMAL HIGH

## 2021-08-01 LAB — SEDIMENTATION RATE: Sed Rate: 56 mm/h — ABNORMAL HIGH (ref 0–20)

## 2021-08-01 LAB — URIC ACID: Uric Acid, Serum: 7.1 mg/dL (ref 4.0–8.0)

## 2021-08-01 LAB — RHEUMATOID FACTOR: Rheumatoid fact SerPl-aCnc: 64 IU/mL — ABNORMAL HIGH (ref <14)

## 2021-08-02 NOTE — Progress Notes (Signed)
Sedimentation rate is elevated.  Rheumatoid factor positive, anti-CCP positive, uric acid is mildly elevated, G6PD normal.  Patient was having a increased joint pain and discomfort in the last visit.  We discussed starting on hydroxychloroquine 200 mg p.o. daily if approved by his ophthalmologist.  If patient have discussed the use of hydroxychloroquine with the ophthalmologist we can send the prescription.  He should have CBC with CMP with GFR and 1 month and then every 3 months.  We should repeat hydroxychloroquine level in 4 months at least 4 hours after the hydroxychloroquine dose.

## 2021-08-03 ENCOUNTER — Telehealth: Payer: Self-pay

## 2021-08-03 ENCOUNTER — Telehealth: Payer: Self-pay | Admitting: Student in an Organized Health Care Education/Training Program

## 2021-08-03 NOTE — Telephone Encounter (Signed)
[name] called stating she was returning Dr. Arlean Hopping call.   ?

## 2021-08-03 NOTE — Telephone Encounter (Signed)
Attempted to call to inform him of Dr Elmon Else recommendations.  Unable to leave message.  ?

## 2021-08-04 ENCOUNTER — Encounter: Payer: Self-pay | Admitting: Student in an Organized Health Care Education/Training Program

## 2021-08-04 ENCOUNTER — Telehealth: Payer: Self-pay | Admitting: Student in an Organized Health Care Education/Training Program

## 2021-08-04 NOTE — Telephone Encounter (Signed)
The nurse tried to call you back to let you know and your mailbox was full so she was unable to leave a message.  The medication is on that you can buy over the counter. Alpha Lipoic Acid '600mg'$ . Hope this helps you. ?

## 2021-08-04 NOTE — Telephone Encounter (Signed)
Please schedule an appointment to start on Arava.

## 2021-08-04 NOTE — Telephone Encounter (Signed)
Spoke with [name] and advised her that Dr. Estanislado Pandy would like to schedule an appointment to discuss [name]. Advised her that we would like for her to attend the appointment and we will need to schedule this as a morning appointment. [name] states she will call back when she speaks with [name] about what other appointments he has scheduled.  ?

## 2021-08-04 NOTE — Telephone Encounter (Signed)
Patient notified

## 2021-08-04 NOTE — Telephone Encounter (Signed)
Attempted to contact the patient and left message for patient to call the office.  

## 2021-08-06 ENCOUNTER — Ambulatory Visit: Payer: Medicare Other | Admitting: Podiatry

## 2021-08-06 ENCOUNTER — Encounter: Payer: Self-pay | Admitting: Podiatry

## 2021-08-06 DIAGNOSIS — N184 Chronic kidney disease, stage 4 (severe): Secondary | ICD-10-CM | POA: Diagnosis not present

## 2021-08-06 DIAGNOSIS — B351 Tinea unguium: Secondary | ICD-10-CM

## 2021-08-06 DIAGNOSIS — L84 Corns and callosities: Secondary | ICD-10-CM | POA: Diagnosis not present

## 2021-08-06 DIAGNOSIS — E1142 Type 2 diabetes mellitus with diabetic polyneuropathy: Secondary | ICD-10-CM | POA: Diagnosis not present

## 2021-08-06 DIAGNOSIS — M79675 Pain in left toe(s): Secondary | ICD-10-CM | POA: Diagnosis not present

## 2021-08-06 DIAGNOSIS — M216X2 Other acquired deformities of left foot: Secondary | ICD-10-CM | POA: Diagnosis not present

## 2021-08-06 DIAGNOSIS — M79674 Pain in right toe(s): Secondary | ICD-10-CM | POA: Diagnosis not present

## 2021-08-06 NOTE — Progress Notes (Signed)
This patient returns to my office for at risk foot care.  This patient requires this care by a professional since this patient will be at risk due to having neuropathy, CKD and diabetes.  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     This patient has a painful callus on the outside ball of his left foot.  No self treatment or professional care was noted. Patient also requests nail care today. This patient presents for at risk foot care today. ? ?General Appearance  Alert, conversant and in no acute stress. ? ?Vascular  Dorsalis pedis and posterior tibial  pulses are weakly  palpable  bilaterally.  Capillary return is within normal limits  bilaterally. Cold feet bilaterally. Absent digital hair  B/L. ? ?Neurologic  Senn-Weinstein monofilament wire test absent  bilaterally. Muscle power within normal limits bilaterally. ? ?Nails Thick disfigured discolored nails with subungual debris  from hallux to fifth toes bilaterally. No evidence of bacterial infection or drainage bilaterally. ? ?Orthopedic  No limitations of motion  feet .  No crepitus or effusions noted.  No bony pathology or digital deformities noted. Plantar flexed fifth metatarsal left foot. ? ?Skin  normotropic skin with no porokeratosis noted bilaterally.  No signs of infections or ulcers noted.   Callus sub 5th metatarsal left foot. ? ?Callus due to plantarflexed fifth metatarsal left foot.  Onychomycosis ? ?Consent was obtained for treatment procedures.   Debridement of callus with # 15 blade.  Debride nails with nail nipper followed by dremel usage. ? ? ?Return office visit    12 weeks                Told patient to return for periodic foot care and evaluation due to potential at risk complications. ? ? ?Gardiner Barefoot DPM   ?

## 2021-08-07 ENCOUNTER — Ambulatory Visit: Payer: Medicare Other | Admitting: Internal Medicine

## 2021-08-10 ENCOUNTER — Ambulatory Visit: Payer: Medicare Other | Admitting: Internal Medicine

## 2021-08-10 DIAGNOSIS — D3131 Benign neoplasm of right choroid: Secondary | ICD-10-CM | POA: Diagnosis not present

## 2021-08-10 DIAGNOSIS — H35372 Puckering of macula, left eye: Secondary | ICD-10-CM | POA: Diagnosis not present

## 2021-08-10 DIAGNOSIS — H353213 Exudative age-related macular degeneration, right eye, with inactive scar: Secondary | ICD-10-CM | POA: Diagnosis not present

## 2021-08-10 DIAGNOSIS — H353221 Exudative age-related macular degeneration, left eye, with active choroidal neovascularization: Secondary | ICD-10-CM | POA: Diagnosis not present

## 2021-08-12 NOTE — Progress Notes (Signed)
Office Visit Note  Patient: [name]             Date of Birth: February 20, 1932           MRN: [id number]             PCP: [name], MD Referring: [name], MD Visit Date: 08/26/2021 Occupation: '@GUAROCC'$ @  Subjective:  Pain in multiple joints  History of Present Illness: [name] is a 88 y.o. male with history of rheumatoid arthritis and osteoarthritis overlap.  He was accompanied by his daughter today.  Patient states that he went to see Dr. Royce Macadamia and he approved the use of hydroxychloroquine or leflunomide if needed.  He states that he continues to have discomfort in his bilateral hands and his bilateral feet.  He has not noticed any joint swelling.  He continues to have discomfort in his right knee joint which has been replaced.  He is on the lower dose of hydrocodone now and has been experiencing more discomfort.  Activities of Daily Living:  Patient reports morning stiffness for 0  none .   Patient Reports nocturnal pain.  Difficulty dressing/grooming: Denies Difficulty climbing stairs: Denies Difficulty getting out of chair: Denies Difficulty using hands for taps, buttons, cutlery, and/or writing: Denies  Review of Systems  Constitutional:  Positive for fatigue.  HENT:  Positive for mouth dryness.   Eyes:  Positive for dryness.  Respiratory:  Positive for shortness of breath.   Cardiovascular:  Negative for swelling in legs/feet.  Gastrointestinal:  Negative for constipation.  Endocrine: Positive for cold intolerance and increased urination.  Genitourinary:  Positive for difficulty urinating and painful urination.  Musculoskeletal:  Positive for joint pain, gait problem and joint pain.  Skin:  Negative for rash.  Allergic/Immunologic: Negative for susceptible to infections.  Neurological:  Positive for numbness and weakness.  Hematological:  Negative for bruising/bleeding tendency.  Psychiatric/Behavioral:  Positive for sleep disturbance.     PMFS History:  Patient Active Problem List   Diagnosis Date Noted   Esophageal dysmotility    UTI (urinary tract infection) 01/28/2021   Bladder cancer ([place]) 01/28/2021   Dysuria 10/29/2020   Weakness 10/29/2020   Macular degeneration, wet ([place]) 06/18/2020   Pain due to onychomycosis of toenails of both feet 05/22/2020   Ventricular tachycardia ([place]) 04/23/2020   Chronic narcotic dependence ([place]) 12/14/2019   Plantar flexed metatarsal bone of left foot 11/08/2019   Long term prescription opiate use 12/14/2018   Neuropathy 12/14/2018   Stage 3b chronic kidney disease ([place]) 12/14/2018   Problems with swallowing and mastication    Achalasia    Right inguinal hernia 07/04/2018   Advance directive discussed with patient 04/28/2018   Chronic pain of both lower extremities 10/26/2017   Neuropathic pain 10/26/2017   Chronic pain syndrome 10/26/2017   Iron deficiency anemia 07/05/2017   Constipation 07/05/2017   CKD (chronic kidney disease) stage 4, GFR 15-29 ml/min ([name]) 05/17/2017   Seropositive rheumatoid arthritis ([place]) 05/17/2017   DM (diabetes mellitus) type II controlled, neurological manifestation ([place]) 05/17/2017   CAD (coronary artery disease) 05/17/2017   Dysphagia 05/17/2017   High risk medication use 10/21/2016   Primary osteoarthritis of both knees 10/21/2016   History of right knee joint replacement 10/21/2016   Orthostatic hypotension 10/01/2016   Spinal stenosis of lumbar region without neurogenic claudication 03/23/2016   Lumbar facet arthropathy 03/23/2016   Respiratory failure, chronic ([place]) 11/21/2014   Rheumatoid arthritis ([place]) 11/05/2014  Rheumatic fever without heart involvement 03/04/2014   Ventral hernia 12/17/2013   ILD (interstitial lung disease) ([place]) 11/28/2011   (HFpEF) heart failure with preserved ejection fraction ([place]) 09/14/2011   Orthostatic dizziness 04/27/2011   Diabetic polyneuropathy associated with type 2 diabetes mellitus ([place])  08/10/2010   Type 2 diabetes mellitus with diabetic polyneuropathy ([place]) 08/10/2010   Obstructive sleep apnea 12/17/2008   ESOPHAGEAL STRICTURE 10/09/2008   Esophageal stricture 10/09/2008   Reflux esophagitis 09/10/2008   Diverticulosis of colon 09/10/2008   Gastro-esophageal reflux disease with esophagitis 09/10/2008   Coronary atherosclerosis 03/19/2008   Aortic stenosis 03/19/2008   Thoracic aorta atherosclerosis ([place]) 03/19/2008   SLEEP DISORDER, CHRONIC 10/17/2006   Disturbance in sleep behavior 10/17/2006   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   Essential hypertension 09/23/2006   GERD 09/23/2006   BENIGN PROSTATIC HYPERTROPHY 09/23/2006   Enlarged prostate without lower urinary tract symptoms (luts) 09/23/2006   HLD (hyperlipidemia) 09/23/2006    Past Medical History:  Diagnosis Date   Aortic stenosis    Arthritis    osteoarthritis, s/p R TKR, and digits   CAD (coronary artery disease)    a. s/p CABG (2001)  b. s/p DES to RCA and cutting POBA to ostial PDA (2013)   c. s/p DES to SVG to OM2 (01/14/14) d. cath: 08/2015 NSTEMI w/ patent LIMA-LAD and 99% stenosis of SVG-OM w/ DES placed. CTO of SVG-RCA and SVG-D1.    Cancer [facility])    bladder  and skin   Cataract    Chronic diastolic CHF (congestive heart failure) ([place])    a) 09/13 ECHO- LVEF 56-31%, grade 1 diastolic dysfunction, mild LA dilatation, atrial septal aneurysm, AV mobility restricted, but no sig AS by doppler; b) 09/04/08 ECHO- LVH, ef 60%, mild AS, c. echo 08/2015: EF perserved of 55-60% with inferolateral HK. Mild AS noted.   Chronic kidney disease, stage III (moderate) ([name])    Chronic lower back pain    Colon polyps    COVID-19    Diverticulosis    Dyspnea 2009 since July -Sept   05/06/08-CPST-  normal effort, reduced VO2 max 20.5 /65%, reduced at 8.2/ 40%, normal breathing resetvca of 55%, submaximal heart rate response 112/77%, flattened o2 pluse response at peak exercise-12 ml/beat @  85%, No VQ mismatch abnormalities, All c/w CIRC Limitation   Enlarged prostate    Esophageal stricture    a. s/p dilation spring 2010   GERD (gastroesophageal reflux disease)    Heart murmur    Hiatal hernia    History of carpal tunnel syndrome    Bilateral   History of kidney stones    History of PFTs    mixed pattern on spiro. mild restn on lung volumes with near normal DLCO. Pattern can be explained by CABG scar. Fev1 2.2L/73%, ratio 68 (67), TLC 4.7/68%,RV 1.5L/55%,DLCO 79%   Hyperlipidemia    Hypertension    Interstitial lung disease ([name])    NOS   Iron deficiency anemia    Myocardial infarction ([name])    Nausea & vomiting    2018/2019   On home oxygen therapy    2 L [place] at bedtime   Osteoporosis    Overweight (BMI 25.0-29.9)    BMI 29   Peripheral neuropathy    RA (rheumatoid arthritis) ([place])    [name]   Seropositive rheumatoid arthritis ([place])    Type II diabetes mellitus ([place])    diet controlled   Wears glasses    Wears partial dentures  upper    Family History  Problem Relation Age of Onset   COPD Mother    Heart disease Father    Heart attack Father    Stomach cancer Brother    Stroke Sister    Alcohol abuse Sister    Colon cancer Brother 25   Diabetes Brother    Rectal cancer Neg Hx    Past Surgical History:  Procedure Laterality Date   BALLOON DILATION N/A 09/12/2018   Procedure: BALLOON DILATION;  Surgeon: Irene Shipper, MD;  [facility];  Service: Endoscopy;  Laterality: N/A;   BALLOON DILATION N/A 02/20/2020   Procedure: BALLOON DILATION;  Surgeon: Irene Shipper, MD;  [facility];  Service: Endoscopy;  Laterality: N/A;   BALLOON DILATION N/A 08/24/2021   Procedure: BALLOON DILATION;  Surgeon: Irene Shipper, MD;  Location[facility];  Service: Gastroenterology;  Laterality: N/A;   BOTOX INJECTION N/A 09/12/2018   Procedure: BOTOX INJECTION;  Surgeon: Irene Shipper, MD;  [facility];  Service: Endoscopy;   Laterality: N/A;   BOTOX INJECTION N/A 11/27/2018   Procedure: BOTOX INJECTION;  Surgeon: Irene Shipper, MD;  [facility];  Service: Endoscopy;  Laterality: N/A;   BOTOX INJECTION N/A 02/20/2020   Procedure: BOTOX INJECTION;  Surgeon: Irene Shipper, MD;  [facility];  Service: Endoscopy;  Laterality: N/A;   BOTOX INJECTION  08/24/2021   Procedure: BOTOX INJECTION;  Surgeon: Irene Shipper, MD;  [facility];  Service: Gastroenterology;;   CARDIAC CATHETERIZATION  08/2004   CP- no MI, Cath- small vessell disease    CARDIAC CATHETERIZATION  12/31/2011   80% distal LM, 100% native LAD, LCx and RCA, 30% prox SVG-OM, SVG-D1 normal, 99% distal, 80% ostial SVG-RCA distal to graft, LIMA-LAD normal; LVEF mildly decreased with posterior basal AK    CARDIAC CATHETERIZATION  2009   with patent grafts/notes 12/31/2011   CARDIAC CATHETERIZATION N/A 08/13/2015   Procedure: Left Heart Cath and Cors/Grafts Angiography;  Surgeon: Sherren Mocha, MD;  Location: [place] [facility];  Service: Cardiovascular;  Laterality: N/A;   CATARACT EXTRACTION W/ INTRAOCULAR LENS  IMPLANT, BILATERAL Bilateral    CHOLECYSTECTOMY OPEN  11/2003   [name]   CORONARY ANGIOPLASTY WITH STENT PLACEMENT  01/03/2012   Successful DES to SVG-RCA and cutting balloon angioplasty ostial  PDA    CORONARY ANGIOPLASTY WITH STENT PLACEMENT  01/14/2014   "1"   CORONARY ARTERY BYPASS GRAFT  11/1999   CABG X5   CORONARY STENT PLACEMENT  02/2012   1 stent and balloon   CYSTOSCOPY WITH BIOPSY N/A 03/17/2021   Procedure: DIAGNOSTIC CYSTOSCOPY WITH BIOPSY;  Surgeon: Robley Fries, MD;  [facility];  Service: Urology;  Laterality: N/A;  1 HR   CYSTOSCOPY WITH RETROGRADE PYELOGRAM, URETEROSCOPY AND STENT PLACEMENT Right 07/28/2021   Procedure: CYSTOSCOPY WITH FULGERATION, STENT PLACEMENT, RETROGRADE PYELOGRAM,  DIAGNOSTIC URETEROSCOPY;  Surgeon: Robley Fries, MD;  [facility];  Service: Urology;  Laterality:  Right;   ESOPHAGEAL DILATION  11/27/2018   Procedure: ESOPHAGEAL DILATION;  Surgeon: Irene Shipper, MD;  [facility];  Service: Endoscopy;;   ESOPHAGOGASTRODUODENOSCOPY N/A 03/01/2017   Procedure: ESOPHAGOGASTRODUODENOSCOPY (EGD);  Surgeon: Irene Shipper, MD;  [facility];  Service: Endoscopy;  Laterality: N/A;   ESOPHAGOGASTRODUODENOSCOPY (EGD) WITH ESOPHAGEAL DILATION  2010   ESOPHAGOGASTRODUODENOSCOPY (EGD) WITH PROPOFOL N/A 09/12/2018   Procedure: ESOPHAGOGASTRODUODENOSCOPY (EGD) WITH PROPOFOL;  Surgeon: Irene Shipper, MD;  [facility];  Service: Endoscopy;  Laterality: N/A;   ESOPHAGOGASTRODUODENOSCOPY (EGD) WITH PROPOFOL N/A 11/27/2018   Procedure: ESOPHAGOGASTRODUODENOSCOPY (EGD) WITH PROPOFOL, WITH BALLOON DILATION;  Surgeon: Irene Shipper, MD;  [facility];  Service: Endoscopy;  Laterality: N/A;   ESOPHAGOGASTRODUODENOSCOPY (EGD) WITH PROPOFOL N/A 02/20/2020   Procedure: ESOPHAGOGASTRODUODENOSCOPY (EGD) WITH PROPOFOL;  Surgeon: Irene Shipper, MD;  [facility];  Service: Endoscopy;  Laterality: N/A;   ESOPHAGOGASTRODUODENOSCOPY (EGD) WITH PROPOFOL N/A 08/24/2021   Procedure: ESOPHAGOGASTRODUODENOSCOPY (EGD) WITH PROPOFOL;  Surgeon: Irene Shipper, MD;  [facility];  Service: Gastroenterology;  Laterality: N/A;   EXTRACORPOREAL SHOCK WAVE LITHOTRIPSY Right 05/11/2021   Procedure: EXTRACORPOREAL SHOCK WAVE LITHOTRIPSY (ESWL);  Surgeon: [name], MD;  Location: [facility];  Service: Urology;  Laterality: Right;   HAND SURGERY     bilateral carpal tunnel releases   JOINT REPLACEMENT     KNEE ARTHROSCOPY Right 2008   LEFT AND RIGHT HEART CATHETERIZATION WITH CORONARY ANGIOGRAM  12/31/2011   Procedure: LEFT AND RIGHT HEART CATHETERIZATION WITH CORONARY ANGIOGRAM;  Surgeon: Burnell Blanks, MD;  Location: [facility] C[facility] LAB;  Service: Cardiovascular;;   LEFT AND RIGHT HEART CATHETERIZATION WITH CORONARY  ANGIOGRAM N/A 01/14/2014   Procedure: LEFT AND RIGHT HEART CATHETERIZATION WITH CORONARY ANGIOGRAM;  Surgeon: Peter M Martinique, MD;  Location: [facility] CATH LAB;  Service: Cardiovascular;  Laterality: N/A;   MALONEY DILATION  03/01/2017   Procedure: Venia Minks DILATION;  Surgeon: Irene Shipper, MD;  [facility];  Service: Endoscopy;;   PERCUTANEOUS CORONARY INTERVENTION-BALLOON ONLY  01/03/2012   Procedure: PERCUTANEOUS CORONARY INTERVENTION-BALLOON ONLY;  Surgeon: Peter M Martinique, MD;  Location: [facility] C[facility] LAB;  Service: Cardiovascular;;   PERCUTANEOUS CORONARY STENT INTERVENTION (PCI-S)  12/31/2011   Procedure: PERCUTANEOUS CORONARY STENT INTERVENTION (PCI-S);  Surgeon: Burnell Blanks, MD;  Location: [facility] CATH LAB;  Service: Cardiovascular;;   PERCUTANEOUS CORONARY STENT INTERVENTION (PCI-S) N/A 01/03/2012   Procedure: PERCUTANEOUS CORONARY STENT INTERVENTION (PCI-S);  Surgeon: Peter M Martinique, MD;  Location: [facility] CATH LAB;  Service: Cardiovascular;  Laterality: N/A;   SHOULDER ARTHROSCOPY WITH OPEN ROTATOR CUFF REPAIR AND DISTAL CLAVICLE ACROMINECTOMY Left 02/27/2013   Procedure: LEFT SHOULDER ARTHROSCOPY WITH MINI OPEN ROTATOR CUFF REPAIR AND SUBACROMIAL DECOMPRESSION AND DISTAL CLAVICLE RESECTION;  Surgeon: [name], MD;  Location: [place];  Service: Orthopedics;  Laterality: Left;   TOTAL KNEE ARTHROPLASTY Right 03/2010   [name]   TRANSURETHRAL RESECTION OF BLADDER TUMOR N/A 03/17/2021   Procedure: TRANSURETHRAL RESECTION OF BLADDER TUMOR (TURBT);  Surgeon: Robley Fries, MD;  [facility];  Service: Urology;  Laterality: N/A;   TRANSURETHRAL RESECTION OF BLADDER TUMOR WITH MITOMYCIN-C N/A 10/08/2020   Procedure: TRANSURETHRAL RESECTION OF BLADDER TUMOR WITH GEMCITABINE;  Surgeon: Robley Fries, MD;  [facility];  Service: Urology;  Laterality: N/A;  75 MINS   TRIGGER FINGER RELEASE Left 02/27/2013   Procedure: RELEASE TRIGGER FINGER/A-1 PULLEY;  Surgeon: [name], MD;  Location: [place];  Service: Orthopedics;  Laterality: Left;   Social History   Social History Narrative   No living will   Requests wife as health care POA-- alternate is daughter Hassan Rowan   Discussed DNR --he requests this (done 08/29/12)   Not sure about feeding tube---but might accept for some time   Patient lives with wife and daughter in a one story home.  Has 3 children.  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  Right Handed    Immunization History  Administered Date(s) Administered   Fluad Quad(high Dose 65+) 12/26/2020   H1N1 03/27/2008   Influenza Split 12/29/2010, 01/18/2012   Influenza Whole 02/03/2007, 01/09/2008, 01/01/2009, 12/31/2009   Influenza,inj,Quad PF,6+ Mos 12/13/2012, 01/15/2014, 01/15/2015, 01/06/2016, 01/03/2017, 02/09/2018, 01/30/2019, 01/05/2020   PFIZER(Purple Top)SARS-COV-2 Vaccination 04/26/2019, 05/17/2019   Pneumococcal Conjugate-13 09/10/2013   Pneumococcal Polysaccharide-23 03/05/1997, 08/17/2011   Td 03/05/1997, 08/17/2011     Objective: Vital Signs: BP 123/64 (BP Location: Left Arm, Patient Position: Sitting, Cuff Size: Small)   Pulse 81   Resp 12   Ht 6' (1.829 m)   Wt 191 lb 3.2 oz (86.7 kg)   BMI 25.93 kg/m    Physical Exam Vitals and nursing note reviewed.  Constitutional:      Appearance: He is well-developed.  HENT:     Head: Normocephalic and atraumatic.  Eyes:     Conjunctiva/sclera: Conjunctivae normal.     Pupils: Pupils are equal, round, and reactive to light.  Cardiovascular:     Rate and Rhythm: Normal rate and regular rhythm.     Heart sounds: Normal heart sounds.  Pulmonary:     Effort: Pulmonary effort is normal.     Breath sounds: Normal breath sounds.     Comments: Crackles at bilateral lung bases Abdominal:     General: Bowel sounds are normal.     Palpations: Abdomen is soft.  Musculoskeletal:     Cervical back: Normal range of motion and neck supple.  Skin:    General:  Skin is warm and dry.     Capillary Refill: Capillary refill takes less than 2 seconds.  Neurological:     Mental Status: He is alert and oriented to person, place, and time.  Psychiatric:        Behavior: Behavior normal.     Musculoskeletal Exam: C-spine was in good range of motion.  He had good range of motion of bilateral shoulder joints with no discomfort.  Elbow joints with good range of motion with no synovitis.  He had no synovitis of wrist joints or MCPs.  He had bilateral PIP and DIP thickening with incomplete fist formation.  Hip joints were in good range of motion.  Right knee joint was replaced without any warmth or effusion.  Left knee joint was in good range of motion.  He had no tenderness over ankles or MTPs.  CDAI Exam: CDAI Score: 0.4  Patient Global: 4 mm; Provider Global: 0 mm Swollen: 0 ; Tender: 0  Joint Exam 08/26/2021   No joint exam has been documented for this visit   There is currently no information documented on the homunculus. Go to the Rheumatology activity and complete the homunculus joint exam.  Investigation: No additional findings.  Imaging: DG C-Arm 1-60 Min-No Report  Result Date: 07/28/2021 Fluoroscopy was utilized by the requesting physician.  No radiographic interpretation.   DG HIP UNILAT WITH PELVIS 2-3 VIEWS RIGHT  Result Date: 07/28/2021 CLINICAL DATA:  Right hip pain EXAM: DG HIP (WITH OR WITHOUT PELVIS) 2-3V RIGHT COMPARISON:  None. FINDINGS: There is no evidence of displaced hip fracture or dislocation. Mild bilateral femoroacetabular arthrosis. Nonobstructive pattern of overlying bowel gas. IMPRESSION: No evidence of displaced hip fracture or dislocation. Mild bilateral femoroacetabular arthrosis. Electronically Signed   By: Delanna Ahmadi M.D.   On: 07/28/2021 10:11    Recent Labs: Lab Results  Component Value Date   WBC 9.4 07/14/2021   HGB 13.3 07/14/2021   PLT  187 07/14/2021   NA 139 07/14/2021   K 4.4 07/14/2021   CL 100  07/14/2021   CO2 31 07/14/2021   GLUCOSE 326 (H) 07/14/2021   BUN 37 (H) 07/14/2021   CREATININE 1.89 (H) 07/14/2021   BILITOT 0.9 04/07/2021   ALKPHOS 90 04/07/2021   AST 18 04/07/2021   ALT 13 04/07/2021   PROT 8.6 (H) 04/07/2021   ALBUMIN 3.6 04/07/2021   CALCIUM 9.9 07/14/2021   GFRAA 30 (L) 06/24/2017    Speciality Comments: D/c Imuran and Cellcept in the past due to GI SE  Procedures:  No procedures performed Allergies: Metronidazole, Doxazosin mesylate, and Methocarbamol   Assessment / Plan:     Visit Diagnoses: Seropositive rheumatoid arthritis ([place]) - his rheumatoid arthritis in remission for several years.  I did not see any synovitis on my examination.  At this point I do not think he needs any immunosuppressive agents for arthritis.  I had a detailed discussion with the patient and his daughter.  I advised him to contact me in case he develops any increased swelling.  I also advised him to take pictures of the joints of the become swollen.  If he develops recurrence of rheumatoid arthritis the options will be hydroxychloroquine or low-dose leflunomide.  ILD (interstitial lung disease) ([place]) - he has history of ILD.  He had crackles in bilateral lung bases.  He denies any increased shortness of breath.  He had intolerance to CellCept and Imuran in the past.  High risk medication use - he has taken CellCept and Imuran in the past but discontinued due to side effects.  Primary osteoarthritis of both hands-he has been experiencing pain and discomfort in her bilateral hands.  No synovitis was noted.  He had limited range of motion due to severe osteoarthritis.  History of right knee joint replacement-he continues to have some discomfort off and on.  No synovitis was noted.  Primary osteoarthritis of left knee-he has intermittent discomfort in his knee joints.  Spondylosis without myelopathy or radiculopathy, lumbar region-chronic pain.  Other medical problems are listed as  follows:  History of hypertension  Chronic diastolic heart failure ([name])  CKD (chronic kidney disease) stage 4, GFR 15-29 ml/min ([name])  History of coronary artery disease  History of hyperlipidemia  History of gastroesophageal reflux (GERD)  History of diabetes mellitus  History of diverticulosis  History of sleep apnea  Orders: No orders of the defined types were placed in this encounter.  No orders of the defined types were placed in this encounter.   Follow-Up Instructions: Return in about 1 year (around 08/27/2022) for Rheumatoid arthritis,ILD.   [name], MD  Note - This record has been created using Editor, commissioning.  Chart creation errors have been sought, but may not always  have been located. Such creation errors do not reflect on  the standard of medical care.

## 2021-08-15 DIAGNOSIS — R0689 Other abnormalities of breathing: Secondary | ICD-10-CM | POA: Diagnosis not present

## 2021-08-15 DIAGNOSIS — J8410 Pulmonary fibrosis, unspecified: Secondary | ICD-10-CM | POA: Diagnosis not present

## 2021-08-17 ENCOUNTER — Encounter (HOSPITAL_COMMUNITY): Payer: Self-pay | Admitting: Internal Medicine

## 2021-08-19 ENCOUNTER — Other Ambulatory Visit: Payer: Self-pay | Admitting: Internal Medicine

## 2021-08-19 DIAGNOSIS — C44622 Squamous cell carcinoma of skin of right upper limb, including shoulder: Secondary | ICD-10-CM | POA: Diagnosis not present

## 2021-08-19 DIAGNOSIS — D0461 Carcinoma in situ of skin of right upper limb, including shoulder: Secondary | ICD-10-CM | POA: Diagnosis not present

## 2021-08-19 DIAGNOSIS — L218 Other seborrheic dermatitis: Secondary | ICD-10-CM | POA: Diagnosis not present

## 2021-08-19 DIAGNOSIS — D0339 Melanoma in situ of other parts of face: Secondary | ICD-10-CM | POA: Diagnosis not present

## 2021-08-19 DIAGNOSIS — L538 Other specified erythematous conditions: Secondary | ICD-10-CM | POA: Diagnosis not present

## 2021-08-19 DIAGNOSIS — L570 Actinic keratosis: Secondary | ICD-10-CM | POA: Diagnosis not present

## 2021-08-19 DIAGNOSIS — D485 Neoplasm of uncertain behavior of skin: Secondary | ICD-10-CM | POA: Diagnosis not present

## 2021-08-19 DIAGNOSIS — L820 Inflamed seborrheic keratosis: Secondary | ICD-10-CM | POA: Diagnosis not present

## 2021-08-20 ENCOUNTER — Ambulatory Visit: Payer: Medicare Other | Admitting: Rheumatology

## 2021-08-21 DIAGNOSIS — N3000 Acute cystitis without hematuria: Secondary | ICD-10-CM | POA: Diagnosis not present

## 2021-08-24 ENCOUNTER — Encounter (HOSPITAL_COMMUNITY): Payer: Self-pay | Admitting: Internal Medicine

## 2021-08-24 ENCOUNTER — Encounter (HOSPITAL_COMMUNITY)
Admission: RE | Disposition: A | Discharge status: Home / Self Care | Payer: Self-pay | Source: Home / Self Care | Attending: Internal Medicine

## 2021-08-24 ENCOUNTER — Ambulatory Visit (HOSPITAL_COMMUNITY): Payer: Medicare Other | Admitting: Certified Registered Nurse Anesthetist

## 2021-08-24 ENCOUNTER — Ambulatory Visit (HOSPITAL_BASED_OUTPATIENT_CLINIC_OR_DEPARTMENT_OTHER): Payer: Medicare Other | Admitting: Certified Registered Nurse Anesthetist

## 2021-08-24 ENCOUNTER — Other Ambulatory Visit: Payer: Self-pay

## 2021-08-24 ENCOUNTER — Ambulatory Visit (HOSPITAL_COMMUNITY)
Admission: RE | Admit: 2021-08-24 | Discharge: 2021-08-24 | Disposition: A | Discharge status: Home / Self Care | Payer: Medicare Other | Attending: Internal Medicine | Admitting: Internal Medicine

## 2021-08-24 DIAGNOSIS — J849 Interstitial pulmonary disease, unspecified: Secondary | ICD-10-CM | POA: Insufficient documentation

## 2021-08-24 DIAGNOSIS — R1310 Dysphagia, unspecified: Secondary | ICD-10-CM | POA: Insufficient documentation

## 2021-08-24 DIAGNOSIS — K219 Gastro-esophageal reflux disease without esophagitis: Secondary | ICD-10-CM | POA: Diagnosis not present

## 2021-08-24 DIAGNOSIS — E1122 Type 2 diabetes mellitus with diabetic chronic kidney disease: Secondary | ICD-10-CM | POA: Insufficient documentation

## 2021-08-24 DIAGNOSIS — I130 Hypertensive heart and chronic kidney disease with heart failure and stage 1 through stage 4 chronic kidney disease, or unspecified chronic kidney disease: Secondary | ICD-10-CM | POA: Diagnosis not present

## 2021-08-24 DIAGNOSIS — I110 Hypertensive heart disease with heart failure: Secondary | ICD-10-CM

## 2021-08-24 DIAGNOSIS — Z87891 Personal history of nicotine dependence: Secondary | ICD-10-CM | POA: Insufficient documentation

## 2021-08-24 DIAGNOSIS — G4730 Sleep apnea, unspecified: Secondary | ICD-10-CM | POA: Insufficient documentation

## 2021-08-24 DIAGNOSIS — R634 Abnormal weight loss: Secondary | ICD-10-CM

## 2021-08-24 DIAGNOSIS — Z7902 Long term (current) use of antithrombotics/antiplatelets: Secondary | ICD-10-CM | POA: Insufficient documentation

## 2021-08-24 DIAGNOSIS — Z951 Presence of aortocoronary bypass graft: Secondary | ICD-10-CM | POA: Insufficient documentation

## 2021-08-24 DIAGNOSIS — Z955 Presence of coronary angioplasty implant and graft: Secondary | ICD-10-CM | POA: Diagnosis not present

## 2021-08-24 DIAGNOSIS — I509 Heart failure, unspecified: Secondary | ICD-10-CM | POA: Diagnosis not present

## 2021-08-24 DIAGNOSIS — K224 Dyskinesia of esophagus: Secondary | ICD-10-CM

## 2021-08-24 DIAGNOSIS — Z79899 Other long term (current) drug therapy: Secondary | ICD-10-CM | POA: Diagnosis not present

## 2021-08-24 DIAGNOSIS — K449 Diaphragmatic hernia without obstruction or gangrene: Secondary | ICD-10-CM | POA: Diagnosis not present

## 2021-08-24 DIAGNOSIS — I252 Old myocardial infarction: Secondary | ICD-10-CM | POA: Diagnosis not present

## 2021-08-24 DIAGNOSIS — I2510 Atherosclerotic heart disease of native coronary artery without angina pectoris: Secondary | ICD-10-CM | POA: Diagnosis not present

## 2021-08-24 DIAGNOSIS — K220 Achalasia of cardia: Secondary | ICD-10-CM

## 2021-08-24 DIAGNOSIS — N184 Chronic kidney disease, stage 4 (severe): Secondary | ICD-10-CM | POA: Insufficient documentation

## 2021-08-24 DIAGNOSIS — D485 Neoplasm of uncertain behavior of skin: Secondary | ICD-10-CM | POA: Diagnosis not present

## 2021-08-24 HISTORY — PX: BALLOON DILATION: SHX5330

## 2021-08-24 HISTORY — PX: ESOPHAGOGASTRODUODENOSCOPY (EGD) WITH PROPOFOL: SHX5813

## 2021-08-24 HISTORY — PX: BOTOX INJECTION: SHX5754

## 2021-08-24 LAB — GLUCOSE, CAPILLARY: Glucose-Capillary: 162 mg/dL — ABNORMAL HIGH (ref 70–99)

## 2021-08-24 SURGERY — ESOPHAGOGASTRODUODENOSCOPY (EGD) WITH PROPOFOL
Anesthesia: Monitor Anesthesia Care

## 2021-08-24 MED ORDER — PROPOFOL 500 MG/50ML IV EMUL
INTRAVENOUS | Status: AC
  Filled 2021-08-24: qty 50

## 2021-08-24 MED ORDER — PROPOFOL 500 MG/50ML IV EMUL
INTRAVENOUS | Status: DC | PRN
Start: 2021-08-24 — End: 2021-08-24
  Administered 2021-08-24: 100 ug/kg/min via INTRAVENOUS

## 2021-08-24 MED ORDER — SODIUM CHLORIDE (PF) 0.9 % IJ SOLN
INTRAMUSCULAR | Status: AC
  Filled 2021-08-24: qty 10

## 2021-08-24 MED ORDER — PROPOFOL 10 MG/ML IV BOLUS
INTRAVENOUS | Status: DC | PRN
  Administered 2021-08-24 (×2): 10 mg via INTRAVENOUS

## 2021-08-24 MED ORDER — SODIUM CHLORIDE (PF) 0.9 % IJ SOLN
INTRAMUSCULAR | Status: DC | PRN
  Administered 2021-08-24: 4 mL via SUBMUCOSAL

## 2021-08-24 MED ORDER — ONABOTULINUMTOXINA 100 UNITS IJ SOLR
INTRAMUSCULAR | Status: AC
Start: 2021-08-24 — End: ?
  Filled 2021-08-24: qty 100

## 2021-08-24 MED ORDER — LACTATED RINGERS IV SOLN
INTRAVENOUS | Status: AC | PRN
  Administered 2021-08-24: 10 mL/h via INTRAVENOUS

## 2021-08-24 MED ORDER — SODIUM CHLORIDE 0.9 % IV SOLN: INTRAVENOUS | Status: DC

## 2021-08-24 SURGICAL SUPPLY — 15 items

## 2021-08-24 NOTE — Anesthesia Preprocedure Evaluation (Signed)
Anesthesia Evaluation  Patient identified by MRN, date of birth, ID band Patient awake    Reviewed: Allergy & Precautions, NPO status , Patient's Chart, lab work & pertinent test results  History of Anesthesia Complications Negative for: history of anesthetic complications  Airway Mallampati: II  TM Distance: >3 FB Neck ROM: Full    Dental  (+) Missing, Partial Upper   Pulmonary sleep apnea and Oxygen sleep apnea , former smoker,  ILD   Pulmonary exam normal        Cardiovascular hypertension, Pt. on medications + CAD, + Past MI, + Cardiac Stents, + CABG (2001) and +CHF  Normal cardiovascular exam   TTE 01/2021: EF 60-65%, grade I DD, mild RVE, mild to moderate AS (AVA 1.35, MG 14)   Neuro/Psych negative neurological ROS  negative psych ROS   GI/Hepatic Neg liver ROS, hiatal hernia, GERD  Medicated and Controlled,  Endo/Other  diabetes, Type 2  Renal/GU Renal InsufficiencyRenal disease (Cr 1.89)  negative genitourinary   Musculoskeletal  (+) Arthritis , Rheumatoid disorders,    Abdominal   Peds  Hematology negative hematology ROS (+)   Anesthesia Other Findings   Reproductive/Obstetrics negative OB ROS                             Anesthesia Physical  Anesthesia Plan  ASA: 3  Anesthesia Plan: MAC   Post-op Pain Management: Minimal or no pain anticipated   Induction: Intravenous  PONV Risk Score and Plan: 1 and Propofol infusion, TIVA and Treatment may vary due to age or medical condition  Airway Management Planned: Natural Airway, Nasal Cannula and Simple Face Mask  Additional Equipment: None  Intra-op Plan:   Post-operative Plan:   Informed Consent: I have reviewed the patients History and Physical, chart, labs and discussed the procedure including the risks, benefits and alternatives for the proposed anesthesia with the patient or authorized representative who has  indicated his/her understanding and acceptance.       Plan Discussed with:   Anesthesia Plan Comments:         Anesthesia Quick Evaluation

## 2021-08-24 NOTE — Anesthesia Postprocedure Evaluation (Signed)
Anesthesia Post Note  Patient: [name]  Procedure(s) Performed: ESOPHAGOGASTRODUODENOSCOPY (EGD) WITH PROPOFOL BOTOX INJECTION BALLOON DILATION     Patient location during evaluation: Endoscopy Anesthesia Type: MAC Level of consciousness: awake and alert Pain management: pain level controlled Vital Signs Assessment: post-procedure vital signs reviewed and stable Respiratory status: spontaneous breathing, nonlabored ventilation and respiratory function stable Cardiovascular status: blood pressure returned to baseline and stable Postop Assessment: no apparent nausea or vomiting Anesthetic complications: no   No notable events documented.  Last Vitals:  Vitals:   08/24/21 1240 08/24/21 1250  BP: (!) 157/78   Pulse: 73 77  Resp: 20 20  Temp:    SpO2: 97% 95%    Last Pain:  Vitals:   08/24/21 1230  TempSrc: Temporal  PainSc: 0-No pain                 Lidia Collum

## 2021-08-24 NOTE — Transfer of Care (Signed)
Immediate Anesthesia Transfer of Care Note  Patient: [name]  Procedure(s) Performed: ESOPHAGOGASTRODUODENOSCOPY (EGD) WITH PROPOFOL BOTOX INJECTION BALLOON DILATION  Patient Location: Endoscopy Unit  Anesthesia Type:MAC  Level of Consciousness: drowsy  Airway & Oxygen Therapy: Patient Spontanous Breathing and Patient connected to face mask  Post-op Assessment: Report given to RN and Post -op Vital signs reviewed and stable  Post vital signs: Reviewed and stable  Last Vitals:  Vitals Value Taken Time  BP    Temp    Pulse    Resp    SpO2      Last Pain:  Vitals:   08/24/21 1113  TempSrc: Temporal  PainSc: 0-No pain         Complications: No notable events documented.

## 2021-08-24 NOTE — Op Note (Signed)
[facility] Patient Name: [name] Procedure Date: 08/24/2021 MRN: [id number] Attending MD: Docia Chuck. Henrene Pastor , MD Date of Birth: 24-Mar-1932 CSN: [id number] Age: 88 Admit Type: Inpatient Procedure:                Upper GI endoscopy with submucosal injection of                            Botox; balloon dilation?"20 mm Indications:              Therapeutic procedure, Dysphagia. Has responded in                            the past 2 Botox injection with dilation. Providers:                Docia Chuck. Henrene Pastor, MD, Truddie Coco, RN, Cherylynn Ridges,                            Technician Referring MD:             Viviana Simpler MD Medicines:                Monitored Anesthesia Care Complications:            No immediate complications. Estimated Blood Loss:     Estimated blood loss: none. Procedure:                Pre-Anesthesia Assessment:                           - Prior to the procedure, a History and Physical                            was performed, and patient medications and                            allergies were reviewed. The patient's tolerance of                            previous anesthesia was also reviewed. The risks                            and benefits of the procedure and the sedation                            options and risks were discussed with the patient.                            All questions were answered, and informed consent                            was obtained. Prior Anticoagulants: The patient has                            taken Plavix (clopidogrel), last dose was 5 days  prior to procedure. ASA Grade Assessment: III - A                            patient with severe systemic disease. After                            reviewing the risks and benefits, the patient was                            deemed in satisfactory condition to undergo the                            procedure.                           After obtaining  informed consent, the endoscope was                            passed under direct vision. Throughout the                            procedure, the patient's blood pressure, pulse, and                            oxygen saturations were monitored continuously. The                            [id number]-[id number] ([id number]) Olympus endoscope was introduced                            through the mouth, and advanced to the second part                            of duodenum. The upper GI endoscopy was                            accomplished without difficulty. The patient                            tolerated the procedure well. Scope In: Scope Out: Findings:      The esophagus was grossly normal. A TTS dilator was passed through the       scope. Dilation with an 18-19-20 mm balloon dilator was performed to 20       mm. Prior to balloon dilation, the area at the gastroesophageal junction       was successfully injected with 100 units botulinum toxin. 25 unit       increments were injected into each of 4 quadrants approximately 1 cm       above the mucosal Z-line and at a 45 degree angle.      The stomach was normal save small hiatal hernia.      The examined duodenum was normal.      The cardia and gastric fundus were normal on retroflexion. Impression:               - Normal esophagus.  Dilated. Injected with                            botulinum toxin.                           - Normal stomach.                           - Normal examined duodenum.                           - No specimens collected. Moderate Sedation:      none Recommendation:           - Patient has a contact number available for                            emergencies. The signs and symptoms of potential                            delayed complications were discussed with the                            patient. Return to normal activities tomorrow.                            Written discharge instructions were provided to the                             patient.                           -Post dilation diet.                           - Continue present medications.                           ?" Resume Plavix today                           ?" Resume care with your primary provider. GI                            follow-up as needed Procedure Code(s):        --- Professional ---                           [phone], Esophagogastroduodenoscopy, flexible,                            transoral; with transendoscopic balloon dilation of                            esophagus (less than 30 mm diameter)                           43236, 59, Esophagogastroduodenoscopy,  flexible,                            transoral; with directed submucosal injection(s),                            any substance Diagnosis Code(s):        --- Professional ---                           R13.10, Dysphagia, unspecified CPT copyright 2019 American Medical Association. All rights reserved. The codes documented in this report are preliminary and upon coder review may  be revised to meet current compliance requirements. Docia Chuck. Henrene Pastor, MD 08/24/2021 12:28:26 PM This report has been signed electronically. Number of Addenda: 0

## 2021-08-24 NOTE — Anesthesia Procedure Notes (Signed)
Procedure Name: MAC Date/Time: 08/24/2021 11:52 AM Performed by: Victoriano Lain, CRNA Pre-anesthesia Checklist: Patient identified, Emergency Drugs available, Suction available, Patient being monitored and Timeout performed Patient Re-evaluated:Patient Re-evaluated prior to induction Oxygen Delivery Method: Simple face mask Dental Injury: Teeth and Oropharynx as per pre-operative assessment

## 2021-08-24 NOTE — H&P (Signed)
Subjective _0 Expand by Default  Patient ID: [name], male    DOB: March 26, 1932, 88 y.o.   MRN: [id number]   HPI [name] is a pleasant 88 year old white male, established with [name], who was last seen at the time of EGD in November 2021. He comes in today with complaints of recurrent dysphagia to liquids and solids over the past 2 to 3 months, somewhat progressive.  He says sometimes water will cause more problems than anything.  He has been having frequent episodes of regurgitation. Also generally having difficulty with solid foods, bread and meat.  No complaints of heartburn or indigestion.  He has lost some weight over the past couple of months.   He has diagnosis of probable achalasia/chronic dysmotility.  At the time of last EGD in November 2021 he was noted to have a mild stricture at the GE junction, he was injected with Botox in 4 quadrants then underwent TTS dilation from 18 to 20 mm, also noted to have a small hiatal hernia. He and his daughter both said that he had very good response to that and did not have any trouble with dysphagia until over the past few months. He had also had EGD in 2020 with Botox and dilation here, and EGD with dilation and Botox at [facility] in 2019. Patient has history of iron deficiency anemia secondary to small bowel AVMs. He is on chronic Plavix with history of coronary artery disease.he is status post CABG, and 3 drug-eluting stents, the last was done in 2015 ,history of sleep apnea, interstitial lung disease with chronic respiratory failure requiring oxygen at 2 L nasal cannula at nighttime.  Also with history of aortic stenosis, last echo October 2022 EF 60 to 65%, mean aortic valve gradient 14 mmHg entheses mild to moderate) AVA 1.35 cm   Hx hypertension, rheumatoid arthritis, chronic kidney disease stage IV.  Fairly recent diagnosis of bladder cancer with resection, and is being worked up for a kidney stone which will need to be removed..     Review of  Systems.Pertinent positive and negative review of systems were noted in the above HPI section.  All other review of systems was otherwise negative.        Outpatient Encounter Medications as of 07/06/2021  Medication Sig   amLODipine (NORVASC) 5 MG tablet Take 5 mg by mouth at bedtime.   aspirin EC 81 MG tablet Take 81 mg by mouth at bedtime.   Blood Glucose Monitoring Suppl (ONE TOUCH ULTRA 2) w/Device KIT Use to obtain blood sugar daily. Dx Code E11.40   carvedilol (COREG) 6.25 MG tablet Take 6.25 mg by mouth 2 (two) times daily with a meal.   cefUROXime (CEFTIN) 250 MG tablet Take 1 tablet (250 mg total) by mouth 2 (two) times daily with a meal.   clopidogrel (PLAVIX) 75 MG tablet Take 1 tablet (75 mg total) by mouth daily.   finasteride (PROSCAR) 5 MG tablet Take 5 mg by mouth daily.   fluticasone (CUTIVATE) 0.05 % cream Apply 1 application. topically daily.   glucose blood (ONE TOUCH ULTRA TEST) test strip USE TO CHECK BLOOD SUGAR ONCE A DAY Dx Code E11.40   HYDROcodone-acetaminophen (NORCO) 10-325 MG tablet Take 1 tablet by mouth every 6 (six) hours as needed for severe pain. Must last 30 days. (Patient taking differently: Take 2 tablets by mouth in the morning and at bedtime. Must last 30 days.)   [START ON 07/16/2021] HYDROcodone-acetaminophen (NORCO) 10-325 MG tablet Take 1 tablet by  mouth every 6 (six) hours as needed for severe pain. Must last 30 days.   [START ON 08/15/2021] HYDROcodone-acetaminophen (NORCO) 10-325 MG tablet Take 1 tablet by mouth every 6 (six) hours as needed for severe pain. Must last 30 days.   ketoconazole (NIZORAL) 2 % shampoo Apply 1 application. topically 2 (two) times a week.   losartan (COZAAR) 25 MG tablet Take 25 mg by mouth daily.   Multiple Vitamins-Minerals (ICAPS AREDS 2 PO) Take 1 capsule by mouth daily.   nitroGLYCERIN (NITROSTAT) 0.4 MG SL tablet DISSOLVE 1 TABLET UNDER TONGUE AS NEEDEDFOR CHEST PAIN. MAY REPEAT 5 MINUTES APART 3 TIMES IF NEEDED. IF NO  RELIEF CALL 911   ondansetron (ZOFRAN) 4 MG tablet Take 1 tablet (4 mg total) by mouth every 6 (six) hours.   OneTouch Delica Lancets 22W MISC 1 each by In Vitro route daily. Dx Code E11.49   OXYGEN Inhale 2 L into the lungs at bedtime. May use in the day time if he gets "winded"   pantoprazole (PROTONIX) 40 MG tablet TAKE 1 TABLET BY MOUTH TWICE (2) DAILY   polyethylene glycol (MIRALAX / GLYCOLAX) 17 g packet Take 17 g by mouth every other day.   pregabalin (LYRICA) 25 MG capsule Take 2 capsules (50 mg total) by mouth 2 (two) times daily.   tamsulosin (FLOMAX) 0.4 MG CAPS capsule Take 0.4 mg by mouth at bedtime.   [DISCONTINUED] doxycycline (VIBRA-TABS) 100 MG tablet Take 100 mg by mouth 2 (two) times daily.   [DISCONTINUED] furosemide (LASIX) 40 MG tablet Take 40 mg by mouth daily as needed. If your weight is over 185#   [DISCONTINUED] pyridostigmine (MESTINON) 60 MG tablet Take 1 tablet (60 mg total) by mouth in the morning and at bedtime. (Patient not taking: Reported on 07/06/2021)    No facility-administered encounter medications on file as of 07/06/2021.         Allergies  Allergen Reactions   Metronidazole        Kidney Dr does not want pt to take    Doxazosin Mesylate Other (See Comments)      dizziness   Methocarbamol Rash        Patient Active Problem List    Diagnosis Date Noted   UTI (urinary tract infection) 01/28/2021   Bladder cancer ([place]) 01/28/2021   Dysuria 10/29/2020   Weakness 10/29/2020   Macular degeneration, wet ([place]) 06/18/2020   Pain due to onychomycosis of toenails of both feet 05/22/2020   Ventricular tachycardia ([place]) 04/23/2020   Chronic narcotic dependence ([place]) 12/14/2019   Plantar flexed metatarsal bone of left foot 11/08/2019   Long term prescription opiate use 12/14/2018   Neuropathy 12/14/2018   Stage 3b chronic kidney disease ([place]) 12/14/2018   Problems with swallowing and mastication     Achalasia     Right inguinal hernia 07/04/2018    Advance directive discussed with patient 04/28/2018   Chronic pain of both lower extremities 10/26/2017   Neuropathic pain 10/26/2017   Chronic pain syndrome 10/26/2017   Iron deficiency anemia 07/05/2017   Constipation 07/05/2017   CKD (chronic kidney disease) stage 4, GFR 15-29 ml/min ([name]) 05/17/2017   Seropositive rheumatoid arthritis ([place]) 05/17/2017   DM (diabetes mellitus) type II controlled, neurological manifestation ([place]) 05/17/2017   CAD (coronary artery disease) 05/17/2017   Dysphagia 05/17/2017   High risk medication use 10/21/2016   Primary osteoarthritis of both knees 10/21/2016   History of right knee joint replacement 10/21/2016   Orthostatic hypotension 10/01/2016  Spinal stenosis of lumbar region without neurogenic claudication 03/23/2016   Lumbar facet arthropathy 03/23/2016   Respiratory failure, chronic ([place]) 11/21/2014   Rheumatoid arthritis ([place]) 11/05/2014   Rheumatic fever without heart involvement 03/04/2014   Ventral hernia 12/17/2013   ILD (interstitial lung disease) ([place]) 11/28/2011   (HFpEF) heart failure with preserved ejection fraction ([place]) 09/14/2011   Orthostatic dizziness 04/27/2011   Diabetic polyneuropathy associated with type 2 diabetes mellitus ([place]) 08/10/2010   Type 2 diabetes mellitus with diabetic polyneuropathy ([place]) 08/10/2010   Obstructive sleep apnea 12/17/2008   ESOPHAGEAL STRICTURE 10/09/2008   Esophageal stricture 10/09/2008   Reflux esophagitis 09/10/2008   Diverticulosis of colon 09/10/2008   Gastro-esophageal reflux disease with esophagitis 09/10/2008   Coronary atherosclerosis 03/19/2008   Aortic stenosis 03/19/2008   Thoracic aorta atherosclerosis ([place]) 03/19/2008   SLEEP DISORDER, CHRONIC 10/17/2006   Disturbance in sleep behavior 10/17/2006   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   Essential hypertension 09/23/2006   GERD 09/23/2006   BENIGN PROSTATIC HYPERTROPHY 09/23/2006   Enlarged prostate  without lower urinary tract symptoms (luts) 09/23/2006   HLD (hyperlipidemia) 09/23/2006    Social History         Socioeconomic History   Marital status: Married      Spouse name: Not on file   Number of children: 3   Years of education: Not on file   Highest education level: Not on file  Occupational History   Occupation: Set designer: RETIRED      Comment: retired  Tobacco Use   Smoking status: Former      Packs/day: 1.00      Years: 20.00      Pack years: 20.00      Types: Cigarettes      Quit date: 04/06/1963      Years since quitting: 58.2   Smokeless tobacco: Former  Scientific laboratory technician Use: Never used  Substance and Sexual Activity   Alcohol use: No      Alcohol/week: 0.0 standard drinks      Comment: 01/01/2012 "last alcohol ~ 64 yr ago"   Drug use: No   Sexual activity: Not Currently  Other Topics Concern   Not on file  Social History Narrative    No living will    Requests wife as health care POA-- alternate is daughter Hassan Rowan    Discussed DNR --he requests this (done 08/29/12)    Not sure about feeding tube---but might accept for some time    Patient lives with wife and daughter in a one story home.  Has 3 children.  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         Right Handed     Social Determinants of Health       Financial Resource Strain: Low Risk    Difficulty of Paying Living Expenses: Not very hard  Food Insecurity: Not on file  Transportation Needs: Not on file  Physical Activity: Not on file  Stress: Not on file  Social Connections: Not on file  Intimate Partner Violence: Not on file      [name] family history includes Alcohol abuse in his sister; COPD in his mother; Colon cancer (age of onset: 65) in his brother; Diabetes in his brother; Heart attack in his father; Heart disease in his father; Stomach cancer in his brother; Stroke in his sister.         Objective:    Objective _0   Expand by Default       Vitals:    07/06/21 1505  BP: 102/72  Pulse: 94      Physical Exam Well-developed well-nourished f elderly white male in no acute distress.  Accompanied by his daughter  Weight, 176 BMI 23.9   HEENT; nontraumatic normocephalic, EOMI, PE R LA, sclera anicteric.,  Hard of hearing Oropharynx; not examined today Neck; supple, no JVD Cardiovascular; regular rate and rhythm with S1-S2, no murmur rub or gallop Pulmonary; decreased breath sounds bilaterally Abdomen; soft, nontender, nondistended, no palpable mass or hepatosplenomegaly, bowel sounds are active Rectal; not done today Skin; benign exam, no jaundice rash or appreciable lesions Extremities; no clubbing cyanosis or edema skin warm and dry Neuro/Psych; alert and oriented x4, grossly nonfocal mood and affect appropriate            Assessment & Plan:    #23 89 year old white male with history of achalasia/chronic dysmotility who comes in today with 2 to 56-monthhistory of recurrent dysphagia to solids and liquids, frequent episodes of regurgitation and weight loss. Last EGD November 2021 with Botox at the GE junction, and TTS dilation from 18 to 20 mm with very good response.  Patient did not have any dysphagia complaints over the past 1 year plus until the past couple of months.   #2 chronic antiplatelet therapy-on Plavix #3 coronary artery disease, status post CABG and drug-eluting stents x3, the last in October 2015 #4 aortic stenosis-mild to moderate aortic valve gradient 14 mmHg #5 interstitial lung disease/chronic respiratory failure/requiring nocturnal O2 2 L #6 chronic kidney disease stage IV #5.  Rheumatoid arthritis #6.  Hypertension #7.  Recent diagnosis of bladder cancer status post resection #8.  Ureterolithiasis-undergoing work-up   Plan; Patient advised to stay on a very soft/mashed potato consistency diet.  He has been drinking boost on a daily basis and encouraged to continue drinking 2 boosts  daily Continue Protonix 40 mg twice daily Patient will be scheduled for EGD with Botox injections and esophageal dilation, at the hospital with Dr. PHenrene Pastor  Procedure was discussed in detail with the patient and his daughter including indications risk benefits and they are agreeable to proceed. Plavix will need to be held for 5 days prior to the procedure.  We will communicate with his cardiologist Dr.Nishan to assure this is reasonable for this patient.   Amy SGenia HaroldPA-C 07/06/2021  Patient presents today for upper endoscopy with Botox injection of the esophagus and esophageal dilation as outlined above.  He tells me that he is doing well and has had no interval clinical change since his recent complete H&P as outlined above.  I saw him at the bedside.  Physical exam is unchanged.  He has been off his Plavix for a full 5 days, as instructed.  JDocia Chuck PGeri [place]in[place], M.D. LPeacehealth Cottage Grove Community HospitalDivision of Gastroenterology

## 2021-08-24 NOTE — Discharge Instructions (Signed)
YOU HAD AN ENDOSCOPIC PROCEDURE TODAY: Refer to the procedure report and other information in the discharge instructions given to you for any specific questions about what was found during the examination. If this information does not answer your questions, please call [place] office at [phone] to clarify.   YOU SHOULD EXPECT: Some feelings of bloating in the abdomen. Passage of more gas than usual. Walking can help get rid of the air that was put into your GI tract during the procedure and reduce the bloating. If you had a lower endoscopy (such as a colonoscopy or flexible sigmoidoscopy) you may notice spotting of blood in your stool or on the toilet paper. Some abdominal soreness may be present for a day or two, also.  DIET: Your first meal following the procedure should be a light meal and then it is ok to progress to your normal diet. A half-sandwich or bowl of soup is an example of a good first meal. Heavy or fried foods are harder to digest and may make you feel nauseous or bloated. Drink plenty of fluids but you should avoid alcoholic beverages for 24 hours. If you had a esophageal dilation, please see attached instructions for diet.    ACTIVITY: Your care partner should take you home directly after the procedure. You should plan to take it easy, moving slowly for the rest of the day. You can resume normal activity the day after the procedure however YOU SHOULD NOT DRIVE, use power tools, machinery or perform tasks that involve climbing or major physical exertion for 24 hours (because of the sedation medicines used during the test).   SYMPTOMS TO REPORT IMMEDIATELY: A gastroenterologist can be reached at any hour. Please call [phone]  for any of the following symptoms:   Following upper endoscopy (EGD, EUS, ERCP, esophageal dilation) Vomiting of blood or coffee ground material  New, significant abdominal pain  New, significant chest pain or pain under the shoulder blades  Painful or  persistently difficult swallowing  New shortness of breath  Black, tarry-looking or red, bloody stools  FOLLOW UP:  If any biopsies were taken you will be contacted by phone or by letter within the next 1-3 weeks. Call [phone]  if you have not heard about the biopsies in 3 weeks.  Please also call with any specific questions about appointments or follow up tests.  

## 2021-08-25 ENCOUNTER — Encounter (HOSPITAL_COMMUNITY): Payer: Self-pay | Admitting: Internal Medicine

## 2021-08-26 ENCOUNTER — Encounter: Payer: Self-pay | Admitting: Rheumatology

## 2021-08-26 ENCOUNTER — Ambulatory Visit: Payer: Medicare Other | Admitting: Rheumatology

## 2021-08-26 VITALS — BP 123/64 | HR 81 | Resp 12 | Ht 72.0 in | Wt 191.2 lb

## 2021-08-26 DIAGNOSIS — M19041 Primary osteoarthritis, right hand: Secondary | ICD-10-CM

## 2021-08-26 DIAGNOSIS — Z8719 Personal history of other diseases of the digestive system: Secondary | ICD-10-CM

## 2021-08-26 DIAGNOSIS — M059 Rheumatoid arthritis with rheumatoid factor, unspecified: Secondary | ICD-10-CM | POA: Diagnosis not present

## 2021-08-26 DIAGNOSIS — Z8679 Personal history of other diseases of the circulatory system: Secondary | ICD-10-CM

## 2021-08-26 DIAGNOSIS — Z8639 Personal history of other endocrine, nutritional and metabolic disease: Secondary | ICD-10-CM | POA: Diagnosis not present

## 2021-08-26 DIAGNOSIS — Z79899 Other long term (current) drug therapy: Secondary | ICD-10-CM | POA: Diagnosis not present

## 2021-08-26 DIAGNOSIS — Z96651 Presence of right artificial knee joint: Secondary | ICD-10-CM | POA: Diagnosis not present

## 2021-08-26 DIAGNOSIS — M47816 Spondylosis without myelopathy or radiculopathy, lumbar region: Secondary | ICD-10-CM | POA: Diagnosis not present

## 2021-08-26 DIAGNOSIS — M1712 Unilateral primary osteoarthritis, left knee: Secondary | ICD-10-CM

## 2021-08-26 DIAGNOSIS — I5032 Chronic diastolic (congestive) heart failure: Secondary | ICD-10-CM | POA: Diagnosis not present

## 2021-08-26 DIAGNOSIS — N184 Chronic kidney disease, stage 4 (severe): Secondary | ICD-10-CM

## 2021-08-26 DIAGNOSIS — Z8669 Personal history of other diseases of the nervous system and sense organs: Secondary | ICD-10-CM

## 2021-08-26 DIAGNOSIS — J849 Interstitial pulmonary disease, unspecified: Secondary | ICD-10-CM

## 2021-08-26 DIAGNOSIS — M19042 Primary osteoarthritis, left hand: Secondary | ICD-10-CM

## 2021-09-01 DIAGNOSIS — N184 Chronic kidney disease, stage 4 (severe): Secondary | ICD-10-CM | POA: Diagnosis not present

## 2021-09-02 DIAGNOSIS — R300 Dysuria: Secondary | ICD-10-CM | POA: Diagnosis not present

## 2021-09-02 DIAGNOSIS — N130 Hydronephrosis with ureteropelvic junction obstruction: Secondary | ICD-10-CM | POA: Diagnosis not present

## 2021-09-02 DIAGNOSIS — C674 Malignant neoplasm of posterior wall of bladder: Secondary | ICD-10-CM | POA: Diagnosis not present

## 2021-09-03 ENCOUNTER — Encounter: Payer: Medicare Other | Admitting: Student in an Organized Health Care Education/Training Program

## 2021-09-07 DIAGNOSIS — R809 Proteinuria, unspecified: Secondary | ICD-10-CM | POA: Diagnosis not present

## 2021-09-07 DIAGNOSIS — N3289 Other specified disorders of bladder: Secondary | ICD-10-CM | POA: Diagnosis not present

## 2021-09-07 DIAGNOSIS — I2510 Atherosclerotic heart disease of native coronary artery without angina pectoris: Secondary | ICD-10-CM | POA: Diagnosis not present

## 2021-09-07 DIAGNOSIS — K222 Esophageal obstruction: Secondary | ICD-10-CM | POA: Diagnosis not present

## 2021-09-07 DIAGNOSIS — N184 Chronic kidney disease, stage 4 (severe): Secondary | ICD-10-CM | POA: Diagnosis not present

## 2021-09-07 DIAGNOSIS — E1122 Type 2 diabetes mellitus with diabetic chronic kidney disease: Secondary | ICD-10-CM | POA: Diagnosis not present

## 2021-09-07 DIAGNOSIS — I129 Hypertensive chronic kidney disease with stage 1 through stage 4 chronic kidney disease, or unspecified chronic kidney disease: Secondary | ICD-10-CM | POA: Diagnosis not present

## 2021-09-14 ENCOUNTER — Other Ambulatory Visit: Payer: Self-pay | Admitting: Student in an Organized Health Care Education/Training Program

## 2021-09-14 DIAGNOSIS — M47816 Spondylosis without myelopathy or radiculopathy, lumbar region: Secondary | ICD-10-CM

## 2021-09-14 DIAGNOSIS — G894 Chronic pain syndrome: Secondary | ICD-10-CM

## 2021-09-14 DIAGNOSIS — E1142 Type 2 diabetes mellitus with diabetic polyneuropathy: Secondary | ICD-10-CM

## 2021-09-14 DIAGNOSIS — G8929 Other chronic pain: Secondary | ICD-10-CM

## 2021-09-14 DIAGNOSIS — M792 Neuralgia and neuritis, unspecified: Secondary | ICD-10-CM

## 2021-09-15 ENCOUNTER — Ambulatory Visit
Payer: Medicare Other | Attending: Student in an Organized Health Care Education/Training Program | Admitting: Student in an Organized Health Care Education/Training Program

## 2021-09-15 ENCOUNTER — Encounter: Payer: Self-pay | Admitting: Student in an Organized Health Care Education/Training Program

## 2021-09-15 VITALS — BP 136/69 | HR 84 | Temp 98.2°F | Resp 16 | Ht 72.0 in | Wt 193.0 lb

## 2021-09-15 DIAGNOSIS — G894 Chronic pain syndrome: Secondary | ICD-10-CM | POA: Diagnosis not present

## 2021-09-15 DIAGNOSIS — E1122 Type 2 diabetes mellitus with diabetic chronic kidney disease: Secondary | ICD-10-CM | POA: Diagnosis not present

## 2021-09-15 DIAGNOSIS — N183 Chronic kidney disease, stage 3 unspecified: Secondary | ICD-10-CM | POA: Insufficient documentation

## 2021-09-15 DIAGNOSIS — M792 Neuralgia and neuritis, unspecified: Secondary | ICD-10-CM | POA: Diagnosis not present

## 2021-09-15 DIAGNOSIS — M79604 Pain in right leg: Secondary | ICD-10-CM | POA: Insufficient documentation

## 2021-09-15 DIAGNOSIS — D0461 Carcinoma in situ of skin of right upper limb, including shoulder: Secondary | ICD-10-CM | POA: Diagnosis not present

## 2021-09-15 DIAGNOSIS — G8929 Other chronic pain: Secondary | ICD-10-CM | POA: Insufficient documentation

## 2021-09-15 DIAGNOSIS — R0689 Other abnormalities of breathing: Secondary | ICD-10-CM | POA: Diagnosis not present

## 2021-09-15 DIAGNOSIS — E1142 Type 2 diabetes mellitus with diabetic polyneuropathy: Secondary | ICD-10-CM | POA: Insufficient documentation

## 2021-09-15 DIAGNOSIS — M79605 Pain in left leg: Secondary | ICD-10-CM | POA: Insufficient documentation

## 2021-09-15 DIAGNOSIS — J8410 Pulmonary fibrosis, unspecified: Secondary | ICD-10-CM | POA: Diagnosis not present

## 2021-09-15 DIAGNOSIS — M47816 Spondylosis without myelopathy or radiculopathy, lumbar region: Secondary | ICD-10-CM | POA: Insufficient documentation

## 2021-09-15 MED ORDER — HYDROCODONE-ACETAMINOPHEN 10-325 MG PO TABS: 1.0000 | ORAL_TABLET | Freq: Four times a day (QID) | ORAL | 0 refills | Status: AC | PRN

## 2021-09-15 MED ORDER — PREGABALIN 50 MG PO CAPS: 50.0000 mg | ORAL_CAPSULE | Freq: Two times a day (BID) | ORAL | 5 refills | Status: DC

## 2021-09-15 MED ORDER — HYDROCODONE-ACETAMINOPHEN 10-325 MG PO TABS: 1.0000 | ORAL_TABLET | Freq: Four times a day (QID) | ORAL | 0 refills | Status: DC | PRN

## 2021-09-15 NOTE — Progress Notes (Signed)
Nursing Pain Medication Assessment:  Safety precautions to be maintained throughout the outpatient stay will include: orient to surroundings, keep bed in low position, maintain call bell within reach at all times, provide assistance with transfer out of bed and ambulation.  Medication Inspection Compliance: [name] did not comply with our request to bring his pills to be counted. He was reminded that bringing the medication bottles, even when empty, is a requirement.  Medication: None brought in. Pill/Patch Count: None available to be counted. Bottle Appearance: No container available. Did not bring bottle(s) to appointment. Filled Date: N/A Last Medication intake:  Today

## 2021-09-15 NOTE — Patient Instructions (Addendum)
09/20/2021 10/20/2021 11/19/2021   Capsaicin Patches What is this medication? CAPSAICIN (cap SAY sin) relieves minor pain in your muscles and joints. It works by making your skin feel warm or cool, which blocks pain signals going to the brain. This medicine may be used for other purposes; ask your health care provider or pharmacist if you have questions. COMMON BRAND NAME(S): Qutenza What should I tell my care team before I take this medication? They need to know if you have any of these conditions: Broken or irritated skin High blood pressure History of heart attack or stroke An unusual or allergic reaction to capsaicin, hot peppers, other medications, foods, dyes, or preservatives Pregnant or trying to get pregnant Breast-feeding How should I use this medication? This medication is for external use only. It is applied by your care team in a hospital or clinic setting. Talk to your care team about the use of this medication in children. Special care may be needed. Overdosage: If you think you have taken too much of this medicine contact a poison control center or emergency room at once. NOTE: This medicine is only for you. Do not share this medicine with others. What if I miss a dose? This does not apply. What may interact with this medication? Interactions are not expected. Do not use any other skin products on the affected area without asking your care team. This list may not describe all possible interactions. Give your health care provider a list of all the medicines, herbs, non-prescription drugs, or dietary supplements you use. Also tell them if you smoke, drink alcohol, or use illegal drugs. Some items may interact with your medicine. What should I watch for while using this medication? Your condition will be monitored carefully while you are receiving this medication. Your blood pressure may go up during the procedure. Do not touch the medication patch during treatment. This  medication causes red, burning skin. You may need pain medication for during and after the procedure. This medication can make you more sensitive to heat for a few days after treatment. Be careful in hot showers or baths. Keep out of the sun. Exercise may make the treated skin feel hotter. Tell your care team if your symptoms do not start to get better or if they get worse. What side effects may I notice from receiving this medication? Side effects that you should report to your care team as soon as possible: Allergic reactions--skin rash, itching, hives, swelling of the face, lips, tongue, or throat Side effects that usually do not require medical attention (report these to your care team if they continue or are bothersome): Mild skin irritation, redness, or dryness This list may not describe all possible side effects. Call your doctor for medical advice about side effects. You may report side effects to FDA at 1-800-FDA-1088. Where should I keep my medication? This medication is given in a hospital or clinic. It will not be stored at home. NOTE: This sheet is a summary. It may not cover all possible information. If you have questions about this medicine, talk to your doctor, pharmacist, or health care provider.  2023 [employer] (2021-01-20 00:00:00)

## 2021-09-15 NOTE — Progress Notes (Signed)
PROVIDER NOTE: Information contained herein reflects review and annotations entered in association with encounter. Interpretation of such information and data should be left to medically-trained personnel. Information provided to patient can be located elsewhere in the medical record under "Patient Instructions". Document created using STT-dictation technology, any transcriptional errors that may result from process are unintentional.    Patient: [name]  Service Category: E/M  Provider: Gillis Santa, MD  DOB: 11-Sep-1931  DOS: 09/15/2021  Specialty: Interventional Pain Management  MRN: [id number]  Setting: Ambulatory outpatient  PCP: [name], MD  Type: Established Patient    Referring Provider: [name], MD  Location: Office  Delivery: Face-to-face     HPI  [name], a 88 y.o. year old male, is here today because of his Lumbar facet arthropathy [M47.816]. Mr. Foerster primary complain today is Foot Pain (bilat)  Last encounter: My last encounter with him was on 06/16/21  Pertinent problems: Mr. Brafford has CKD (chronic kidney disease) stage 4, GFR 15-29 ml/min ([place]); Spinal stenosis of lumbar region without neurogenic claudication; Lumbar facet arthropathy; Chronic pain of both lower extremities; Neuropathic pain; Chronic pain syndrome; and Stage 3b chronic kidney disease ([place]) on their pertinent problem list. Pain Assessment: Severity of Chronic pain is reported as a 3 /10. Location: Foot Right, Left/denies. Onset: More than a month ago. Quality: Aching, Burning. Timing: Intermittent. Modifying factor(s): meds. Vitals:  height is 6' (1.829 m) and weight is 193 lb (87.5 kg). His temporal temperature is 98.2 F (36.8 C). His blood pressure is 136/69 and his pulse is 84. His respiration is 16 and oxygen saturation is 95%.   Reason for encounter: medication management.    Patient presents today for medication management.  Is endorsing bilateral burning foot pain.   This is related to diabetic polyneuropathy.  We discussed Qutenza, capsaicin 8% topical system.  Recent benefits were reviewed.  Patient states that he would like this therapy.   We will order Qutenza therapy Refill hydrocodone and Lyrica as below Renew urine toxicology screen for medication compliance and monitoring.   Pharmacotherapy Assessment  Analgesic: Hydrocodone 10 to 20 mg twice daily as needed    Monitoring: [place] PMP: PDMP reviewed during this encounter.       Pharmacotherapy: No side-effects or adverse reactions reported. Compliance: No problems identified. Effectiveness: Clinically acceptable.  UDS:  Summary  Date Value Ref Range Status  09/09/2020 Note  Final    Comment:    ==================================================================== ToxASSURE Select 13 (MW) ==================================================================== Test                             Result       Flag       Units  Drug Present and Declared for Prescription Verification   Hydrocodone                    2490         EXPECTED   ng/mg creat   Hydromorphone                  2240         EXPECTED   ng/mg creat   Dihydrocodeine                 352          EXPECTED   ng/mg creat   Norhydrocodone  1583         EXPECTED   ng/mg creat    Sources of hydrocodone include scheduled prescription medications.    Hydromorphone, dihydrocodeine and norhydrocodone are expected    metabolites of hydrocodone. Hydromorphone and dihydrocodeine are    also available as scheduled prescription medications.  ==================================================================== Test                      Result    Flag   Units      Ref Range   Creatinine              90               mg/dL      >=20 ==================================================================== Declared Medications:  The flagging and interpretation on this report are based on the  following declared medications.  Unexpected  results may arise from  inaccuracies in the declared medications.   **Note: The testing scope of this panel includes these medications:   Hydrocodone (Norco)   **Note: The testing scope of this panel does not include the  following reported medications:   Acetaminophen (Norco)  Amlodipine (Norvasc)  Aspirin  Carvedilol (Coreg)  Cephalexin (Keflex)  Clopidogrel (Plavix)  Furosemide (Lasix)  Helium  Losartan (Cozaar)  Lutein  Nitroglycerin (Nitrostat)  Omega-3 Fatty Acids  Oxygen  Pantoprazole (Protonix)  Polyethylene Glycol (MiraLAX)  Pregabalin (Lyrica)  Semaglutide (Rybelsus)  Tamsulosin (Flomax)  Vitamin C ==================================================================== For clinical consultation, please call [phone]. ====================================================================       ROS  Constitutional: Denies any fever or chills Gastrointestinal: No reported hemesis, hematochezia, vomiting, or acute GI distress Musculoskeletal:  Low back, bilateral leg pain Neurological:  Burning in bilateral feet  Medication Review  Ferrous Sulfate, HYDROcodone-acetaminophen, Multiple Vitamins-Minerals, ONE TOUCH ULTRA 2, OneTouch Delica Lancets 80K, Oxygen-Helium, amLODipine, aspirin EC, cefUROXime, clopidogrel, finasteride, fluticasone, glucose blood, ketoconazole, losartan, nitroGLYCERIN, ondansetron, pantoprazole, polyethylene glycol, pregabalin, and tamsulosin  History Review  Allergy: [name] is allergic to metronidazole, doxazosin mesylate, and methocarbamol. Drug: Mr. Jarriel  reports no history of drug use. Alcohol:  reports no history of alcohol use. Tobacco:  reports that he quit smoking about 58 years ago. His smoking use included cigarettes. He has a 20.00 pack-year smoking history. He has been exposed to tobacco smoke. He has quit using smokeless tobacco. Social: Mr. Fornwalt  reports that he quit smoking about 58 years ago. His smoking use  included cigarettes. He has a 20.00 pack-year smoking history. He has been exposed to tobacco smoke. He has quit using smokeless tobacco. He reports that he does not drink alcohol and does not use drugs. Medical:  has a past medical history of Aortic stenosis, Arthritis, CAD (coronary artery disease), Cancer ([place]), Cataract, Chronic diastolic CHF (congestive heart failure) ([place]), Chronic kidney disease, stage III (moderate) ([place]), Chronic lower back pain, Colon polyps, COVID-19, Diverticulosis, Dyspnea (2009 since July -Sept), Enlarged prostate, Esophageal stricture, GERD (gastroesophageal reflux disease), Heart murmur, Hiatal hernia, History of carpal tunnel syndrome, History of kidney stones, History of PFTs, Hyperlipidemia, Hypertension, Interstitial lung disease ([place]), Iron deficiency anemia, Myocardial infarction ([place]), Nausea & vomiting, On home oxygen therapy, Osteoporosis, Overweight (BMI 25.0-29.9), Peripheral neuropathy, RA (rheumatoid arthritis) ([place]), Seropositive rheumatoid arthritis ([place]), Type II diabetes mellitus ([place]), Wears glasses, and Wears partial dentures. Surgical: Mr. Shannahan  has a past surgical history that includes Total knee arthroplasty (Right, 03/2010); Knee arthroscopy (Right, 2008); Cataract extraction w/ intraocular lens  implant, bilateral (Bilateral); Coronary artery bypass graft (  11/1999); Coronary stent placement (02/2012); Shoulder arthroscopy with open rotator cuff repair and distal clavicle acrominectomy (Left, 02/27/2013); Trigger finger release (Left, 02/27/2013); Cholecystectomy open (11/2003); Joint replacement; Esophagogastroduodenoscopy (egd) with esophageal dilation (2010); Cardiac catheterization (08/2004); Cardiac catheterization (12/31/2011); Cardiac catheterization (2009); left and right heart catheterization with coronary angiogram (12/31/2011); percutaneous coronary stent intervention (pci-s) (12/31/2011); percutaneous coronary stent intervention (pci-s) (N/A,  01/03/2012); Percutaneous coronary intervention-balloon only (01/03/2012); left and right heart catheterization with coronary angiogram (N/A, 01/14/2014); Hand surgery; Cardiac catheterization (N/A, 08/13/2015); Esophagogastroduodenoscopy (N/A, 03/01/2017); maloney dilation (03/01/2017); Esophagogastroduodenoscopy (egd) with propofol (N/A, 09/12/2018); Botox injection (N/A, 09/12/2018); Balloon dilation (N/A, 09/12/2018); Coronary angioplasty with stent (01/03/2012); Coronary angioplasty with stent (01/14/2014); Esophagogastroduodenoscopy (egd) with propofol (N/A, 11/27/2018); Botox injection (N/A, 11/27/2018); Esophageal dilation (11/27/2018); Esophagogastroduodenoscopy (egd) with propofol (N/A, 02/20/2020); Botox injection (N/A, 02/20/2020); Balloon dilation (N/A, 02/20/2020); Transurethral resection of bladder tumor with mitomycin-c (N/A, 10/08/2020); Cystoscopy with biopsy (N/A, 03/17/2021); Transurethral resection of bladder tumor (N/A, 03/17/2021); Extracorporeal shock wave lithotripsy (Right, 05/11/2021); Cystoscopy with retrograde pyelogram, ureteroscopy and stent placement (Right, 07/28/2021); Esophagogastroduodenoscopy (egd) with propofol (N/A, 08/24/2021); Botox injection (08/24/2021); and Balloon dilation (N/A, 08/24/2021). Family: family history includes Alcohol abuse in his sister; COPD in his mother; Colon cancer (age of onset: 68) in his brother; Diabetes in his brother; Heart attack in his father; Heart disease in his father; Stomach cancer in his brother; Stroke in his sister.  Laboratory Chemistry Profile   Renal Lab Results  Component Value Date   BUN 37 (H) 07/14/2021   CREATININE 1.89 (H) 07/14/2021   BCR 13 06/24/2017   GFR 29.52 (L) 10/29/2020   GFRAA 30 (L) 06/24/2017   GFRNONAA 34 (L) 07/14/2021     Hepatic Lab Results  Component Value Date   AST 18 04/07/2021   ALT 13 04/07/2021   ALBUMIN 3.6 04/07/2021   ALKPHOS 90 04/07/2021   LIPASE 32 01/27/2021     Electrolytes Lab Results   Component Value Date   NA 139 07/14/2021   K 4.4 07/14/2021   CL 100 07/14/2021   CALCIUM 9.9 07/14/2021   MG 2.1 04/12/2020   PHOS 2.9 06/13/2019     Bone No results found for: "VD25OH", "VD125OH2TOT", "OH6073XT0", "GY6948NI6", "25OHVITD1", "25OHVITD2", "25OHVITD3", "TESTOFREE", "TESTOSTERONE"   Inflammation (CRP: Acute Phase) (ESR: Chronic Phase) Lab Results  Component Value Date   ESRSEDRATE 56 (H) 07/29/2021   LATICACIDVEN 1.6 01/28/2021       Note: Above Lab results reviewed.  Recent Imaging Review  DG C-Arm 1-60 Min-No Report Fluoroscopy was utilized by the requesting physician.  No radiographic  interpretation.  DG HIP UNILAT WITH PELVIS 2-3 VIEWS RIGHT CLINICAL DATA:  Right hip pain  EXAM: DG HIP (WITH OR WITHOUT PELVIS) 2-3V RIGHT  COMPARISON:  None.  FINDINGS: There is no evidence of displaced hip fracture or dislocation. Mild bilateral femoroacetabular arthrosis. Nonobstructive pattern of overlying bowel gas.  IMPRESSION: No evidence of displaced hip fracture or dislocation. Mild bilateral femoroacetabular arthrosis.  Electronically Signed   By: Delanna Ahmadi M.D.   On: 07/28/2021 10:11  Note: Reviewed        Physical Exam  General appearance: Well nourished, well developed, and well hydrated. In no apparent acute distress Mental status: Alert, oriented x 3 (person, place, & time)       Respiratory: No evidence of acute respiratory distress Eyes: PERLA Vitals: BP 136/69   Pulse 84   Temp 98.2 F (36.8 C) (Temporal)   Resp 16   Ht 6' (1.829 m)  Wt 193 lb (87.5 kg)   SpO2 95%   BMI 26.18 kg/m  BMI: Estimated body mass index is 26.18 kg/m as calculated from the following:   Height as of this encounter: 6' (1.829 m).   Weight as of this encounter: 193 lb (87.5 kg). Ideal: Ideal body weight: 77.6 kg (171 lb 1.2 oz) Adjusted ideal body weight: 81.6 kg (179 lb 13.5 oz)   Lumbar Spine Area Exam  Skin & Axial Inspection: No masses, redness,  or swelling Alignment: Symmetrical Functional ROM: Decreased ROM , slightly improved after treatment      Stability: No instability detected Muscle Tone/Strength: Functionally intact. No obvious neuro-muscular anomalies detected. Sensory (Neurological): Musculoskeletal pain pattern, improved after treatment Palpation: No palpable anomalies       Provocative Tests: Hyperextension/rotation test: (+) bilaterally for facet joint pain. Lumbar quadrant test (Kemp's test): (+) bilaterally for facet joint pain.   Gait & Posture Assessment  Ambulation: Unassisted Gait: Relatively normal for age and body habitus Posture: WNL   Lower Extremity Exam      Side: Right lower extremity   Side: Left lower extremity  Stability: No instability observed           Stability: No instability observed          Skin & Extremity Inspection: Skin color, temperature, and hair growth are WNL. No peripheral edema or cyanosis. No masses, redness, swelling, asymmetry, or associated skin lesions. No contractures.   Skin & Extremity Inspection: Skin color, temperature, and hair growth are WNL. No peripheral edema or cyanosis. No masses, redness, swelling, asymmetry, or associated skin lesions. No contractures.  Functional ROM: Decreased ROM                   Functional ROM: Decreased ROM                  Muscle Tone/Strength: Functionally intact. No obvious neuro-muscular anomalies detected.   Muscle Tone/Strength: Functionally intact. No obvious neuro-muscular anomalies detected.  Sensory (Neurological): Neuropathic pain pattern         Sensory (Neurological): Neuropathic pain pattern        DTR: Patellar: deferred today Achilles: deferred today Plantar: deferred today   DTR: Patellar: deferred today Achilles: deferred today Plantar: deferred today  Palpation: No palpable anomalies   Palpation: No palpable anomalies      Assessment   Status Diagnosis  Persistent Persistent Persistent 1. Lumbar facet  arthropathy   2. Lumbar spondylosis   3. Neuropathic pain   4. Diabetic polyneuropathy associated with type 2 diabetes mellitus ([place])   5. Chronic pain of both lower extremities   6. CKD stage 3 due to type 2 diabetes mellitus ([place])   7. Chronic pain syndrome       Plan of Care   Mr. MEGAN HAYDUK has a current medication list which includes the following long-term medication(s): nitroglycerin, pantoprazole, pregabalin, and pregabalin.  1. Lumbar facet arthropathy - HYDROcodone-acetaminophen (NORCO) 10-325 MG tablet; Take 1 tablet by mouth every 6 (six) hours as needed for severe pain. Must last 30 days.  Dispense: 120 tablet; Refill: 0 - HYDROcodone-acetaminophen (NORCO) 10-325 MG tablet; Take 1 tablet by mouth every 6 (six) hours as needed for severe pain. Must last 30 days.  Dispense: 120 tablet; Refill: 0 - HYDROcodone-acetaminophen (NORCO) 10-325 MG tablet; Take 1 tablet by mouth every 6 (six) hours as needed for severe pain. Must last 30 days.  Dispense: 120 tablet; Refill: 0 - pregabalin (LYRICA)  50 MG capsule; Take 1 capsule (50 mg total) by mouth 2 (two) times daily.  Dispense: 60 capsule; Refill: 5  2. Lumbar spondylosis - HYDROcodone-acetaminophen (NORCO) 10-325 MG tablet; Take 1 tablet by mouth every 6 (six) hours as needed for severe pain. Must last 30 days.  Dispense: 120 tablet; Refill: 0 - HYDROcodone-acetaminophen (NORCO) 10-325 MG tablet; Take 1 tablet by mouth every 6 (six) hours as needed for severe pain. Must last 30 days.  Dispense: 120 tablet; Refill: 0 - HYDROcodone-acetaminophen (NORCO) 10-325 MG tablet; Take 1 tablet by mouth every 6 (six) hours as needed for severe pain. Must last 30 days.  Dispense: 120 tablet; Refill: 0 - pregabalin (LYRICA) 50 MG capsule; Take 1 capsule (50 mg total) by mouth 2 (two) times daily.  Dispense: 60 capsule; Refill: 5  3. Neuropathic pain - HYDROcodone-acetaminophen (NORCO) 10-325 MG tablet; Take 1 tablet by mouth every 6 (six)  hours as needed for severe pain. Must last 30 days.  Dispense: 120 tablet; Refill: 0 - HYDROcodone-acetaminophen (NORCO) 10-325 MG tablet; Take 1 tablet by mouth every 6 (six) hours as needed for severe pain. Must last 30 days.  Dispense: 120 tablet; Refill: 0 - HYDROcodone-acetaminophen (NORCO) 10-325 MG tablet; Take 1 tablet by mouth every 6 (six) hours as needed for severe pain. Must last 30 days.  Dispense: 120 tablet; Refill: 0 - pregabalin (LYRICA) 50 MG capsule; Take 1 capsule (50 mg total) by mouth 2 (two) times daily.  Dispense: 60 capsule; Refill: 5 - NEUROLYSIS; Future  4. Diabetic polyneuropathy associated with type 2 diabetes mellitus ([name]) - HYDROcodone-acetaminophen (NORCO) 10-325 MG tablet; Take 1 tablet by mouth every 6 (six) hours as needed for severe pain. Must last 30 days.  Dispense: 120 tablet; Refill: 0 - HYDROcodone-acetaminophen (NORCO) 10-325 MG tablet; Take 1 tablet by mouth every 6 (six) hours as needed for severe pain. Must last 30 days.  Dispense: 120 tablet; Refill: 0 - HYDROcodone-acetaminophen (NORCO) 10-325 MG tablet; Take 1 tablet by mouth every 6 (six) hours as needed for severe pain. Must last 30 days.  Dispense: 120 tablet; Refill: 0 - pregabalin (LYRICA) 50 MG capsule; Take 1 capsule (50 mg total) by mouth 2 (two) times daily.  Dispense: 60 capsule; Refill: 5 - NEUROLYSIS; Future  5. Chronic pain of both lower extremities - HYDROcodone-acetaminophen (NORCO) 10-325 MG tablet; Take 1 tablet by mouth every 6 (six) hours as needed for severe pain. Must last 30 days.  Dispense: 120 tablet; Refill: 0 - HYDROcodone-acetaminophen (NORCO) 10-325 MG tablet; Take 1 tablet by mouth every 6 (six) hours as needed for severe pain. Must last 30 days.  Dispense: 120 tablet; Refill: 0 - HYDROcodone-acetaminophen (NORCO) 10-325 MG tablet; Take 1 tablet by mouth every 6 (six) hours as needed for severe pain. Must last 30 days.  Dispense: 120 tablet; Refill: 0 - pregabalin (LYRICA)  50 MG capsule; Take 1 capsule (50 mg total) by mouth 2 (two) times daily.  Dispense: 60 capsule; Refill: 5  6. CKD stage 3 due to type 2 diabetes mellitus ([place])  7. Chronic pain syndrome - HYDROcodone-acetaminophen (NORCO) 10-325 MG tablet; Take 1 tablet by mouth every 6 (six) hours as needed for severe pain. Must last 30 days.  Dispense: 120 tablet; Refill: 0 - HYDROcodone-acetaminophen (NORCO) 10-325 MG tablet; Take 1 tablet by mouth every 6 (six) hours as needed for severe pain. Must last 30 days.  Dispense: 120 tablet; Refill: 0 - HYDROcodone-acetaminophen (NORCO) 10-325 MG tablet; Take 1 tablet by mouth  every 6 (six) hours as needed for severe pain. Must last 30 days.  Dispense: 120 tablet; Refill: 0 - pregabalin (LYRICA) 50 MG capsule; Take 1 capsule (50 mg total) by mouth 2 (two) times daily.  Dispense: 60 capsule; Refill: 5 - NEUROLYSIS; Future - ToxASSURE Select 13 (MW), Urine    Pharmacotherapy (Medications Ordered): Meds ordered this encounter  Medications   HYDROcodone-acetaminophen (NORCO) 10-325 MG tablet    Sig: Take 1 tablet by mouth every 6 (six) hours as needed for severe pain. Must last 30 days.    Dispense:  120 tablet    Refill:  0    Chronic Pain: STOP Act (Not applicable) Fill 1 day early if closed on refill date. Avoid benzodiazepines within 8 hours of opioids   HYDROcodone-acetaminophen (NORCO) 10-325 MG tablet    Sig: Take 1 tablet by mouth every 6 (six) hours as needed for severe pain. Must last 30 days.    Dispense:  120 tablet    Refill:  0    Chronic Pain: STOP Act (Not applicable) Fill 1 day early if closed on refill date. Avoid benzodiazepines within 8 hours of opioids   HYDROcodone-acetaminophen (NORCO) 10-325 MG tablet    Sig: Take 1 tablet by mouth every 6 (six) hours as needed for severe pain. Must last 30 days.    Dispense:  120 tablet    Refill:  0    Chronic Pain: STOP Act (Not applicable) Fill 1 day early if closed on refill date. Avoid  benzodiazepines within 8 hours of opioids   pregabalin (LYRICA) 50 MG capsule    Sig: Take 1 capsule (50 mg total) by mouth 2 (two) times daily.    Dispense:  60 capsule    Refill:  5    Fill one day early if pharmacy is closed on scheduled refill date. May substitute for generic if available.    Orders Placed This Encounter  Procedures   NEUROLYSIS    Please order Qutenza patches from pharmacy    Standing Status:   Future    Standing Expiration Date:   12/16/2021    Order Specific Question:   Where will this procedure be performed?    Answer:   [facility]   ToxASSURE Select 13 (MW), Urine    Volume: 30 ml(s). Minimum 3 ml of urine is needed. Document temperature of fresh sample. Indications: Long term (current) use of opiate analgesic (P71.062)    Order Specific Question:   Release to patient    Answer:   Immediate    - capsaicin 8%, Qutenza topical system for neuropathic pain of bilateral feet related to diabetic polyneuropathy.    Follow-up in 3 months for medication management.  Follow-up plan:   Return in about 4 weeks (around 10/13/2021) for qutenza.   Recent Visits No visits were found meeting these conditions. Showing recent visits within past 90 days and meeting all other requirements Today's Visits Date Type Provider Dept  09/15/21 Office Visit Gillis Santa, MD [facility]  Showing today's visits and meeting all other requirements Future Appointments No visits were found meeting these conditions. Showing future appointments within next 90 days and meeting all other requirements  I discussed the assessment and treatment plan with the patient. The patient was provided an opportunity to ask questions and all were answered. The patient agreed with the plan and demonstrated an understanding of the instructions.  Patient advised to call back or seek an in-person evaluation if the symptoms or  condition worsens.  Duration of encounter:  33mnutes.  Note by: BGillis Santa MD Date: 09/15/2021; Time: 2:45 PM

## 2021-09-16 DIAGNOSIS — R350 Frequency of micturition: Secondary | ICD-10-CM | POA: Diagnosis not present

## 2021-09-16 DIAGNOSIS — R3915 Urgency of urination: Secondary | ICD-10-CM | POA: Diagnosis not present

## 2021-09-19 ENCOUNTER — Other Ambulatory Visit: Payer: Self-pay | Admitting: Internal Medicine

## 2021-09-19 LAB — TOXASSURE SELECT 13 (MW), URINE

## 2021-10-07 ENCOUNTER — Ambulatory Visit (INDEPENDENT_AMBULATORY_CARE_PROVIDER_SITE_OTHER): Payer: Medicare Other | Admitting: Internal Medicine

## 2021-10-07 ENCOUNTER — Encounter: Payer: Self-pay | Admitting: Internal Medicine

## 2021-10-07 DIAGNOSIS — M79604 Pain in right leg: Secondary | ICD-10-CM | POA: Diagnosis not present

## 2021-10-07 DIAGNOSIS — R300 Dysuria: Secondary | ICD-10-CM | POA: Diagnosis not present

## 2021-10-07 DIAGNOSIS — R110 Nausea: Secondary | ICD-10-CM

## 2021-10-07 DIAGNOSIS — K4090 Unilateral inguinal hernia, without obstruction or gangrene, not specified as recurrent: Secondary | ICD-10-CM | POA: Diagnosis not present

## 2021-10-07 DIAGNOSIS — G8929 Other chronic pain: Secondary | ICD-10-CM | POA: Diagnosis not present

## 2021-10-07 DIAGNOSIS — M79605 Pain in left leg: Secondary | ICD-10-CM | POA: Diagnosis not present

## 2021-10-07 LAB — POC URINALSYSI DIPSTICK (AUTOMATED)
Bilirubin, UA: NEGATIVE
Glucose, UA: NEGATIVE
Ketones, UA: NEGATIVE
Nitrite, UA: NEGATIVE
Protein, UA: POSITIVE — AB
Spec Grav, UA: 1.015 (ref 1.010–1.025)
Urobilinogen, UA: 0.2 E.U./dL
pH, UA: 6 (ref 5.0–8.0)

## 2021-10-07 NOTE — Assessment & Plan Note (Signed)
Shows me his hernia at end of visit (with family out)--wondering if it could be leaking or related to his urinary problems He does have large reducible right inguinal hernia into scrotum----reassured it doesn't seem to be causing his urinary symptoms

## 2021-10-07 NOTE — Addendum Note (Signed)
Addended by: Pilar Grammes on: 10/07/2021 12:55 PM   Modules accepted: Orders

## 2021-10-07 NOTE — Assessment & Plan Note (Addendum)
And frequency I am concerned about possible infection---despite the preventative cephalexin Will check urinalysis I am concerned about the myrbetriq as well---could cause retention  Urinalysis shows leukocytes and blood Will send culture and adjust antibiotics prn (stop the cephalexin)

## 2021-10-07 NOTE — Patient Instructions (Signed)
Please stop the cephalexin and the myrbetriq. Cut the lyrica to 25 mg twice a day AND 1/2 of the hydrocodone at night. You can increase these back up one at a time if your pain gets much worse

## 2021-10-07 NOTE — Progress Notes (Signed)
Subjective:    Patient ID: [name], male    DOB: January 31, 1932, 88 y.o.   MRN: [id number]  HPI Here due to weakness and not feeling well still With daughter and wife  Has had nauseated, dizzy, bleary eyed, weak Goes back 2 weeks No recent fever or illness  Hasn't been taking plavix---stopped for EGD and never restarted Dysphagia is better since the EGD/dilation Having problems with urination since cystoscopy---has dysuria Nocturia x 4 or more and goes as much as every 5 minutes during the day Still with dysuria despite the cephalexin Is on the mybetriq but no clear response to it  Current Outpatient Medications on File Prior to Visit  Medication Sig Dispense Refill   amLODipine (NORVASC) 5 MG tablet Take 5 mg by mouth at bedtime.     aspirin EC 81 MG tablet Take 81 mg by mouth at bedtime.     Blood Glucose Monitoring Suppl (ONE TOUCH ULTRA 2) w/Device KIT Use to obtain blood sugar daily. Dx Code E11.40 1 kit 0   cephALEXin (KEFLEX) 250 MG capsule Take 250 mg by mouth daily.     Ferrous Sulfate 28 MG TABS Take 28 mg by mouth 2 (two) times a week.     finasteride (PROSCAR) 5 MG tablet Take 5 mg by mouth daily.     fluticasone (CUTIVATE) 0.05 % cream Apply 1 application. topically daily as needed (irritation).     glucose blood (ONE TOUCH ULTRA TEST) test strip USE TO CHECK BLOOD SUGAR ONCE A DAY Dx Code E11.40 100 each 3   HYDROcodone-acetaminophen (NORCO) 10-325 MG tablet Take 1 tablet by mouth every 6 (six) hours as needed for severe pain. Must last 30 days. 120 tablet 0   [START ON 10/20/2021] HYDROcodone-acetaminophen (NORCO) 10-325 MG tablet Take 1 tablet by mouth every 6 (six) hours as needed for severe pain. Must last 30 days. 120 tablet 0   [START ON 11/19/2021] HYDROcodone-acetaminophen (NORCO) 10-325 MG tablet Take 1 tablet by mouth every 6 (six) hours as needed for severe pain. Must last 30 days. 120 tablet 0   ketoconazole (NIZORAL) 2 % shampoo Apply 1 application.  topically 2 (two) times a week.     losartan (COZAAR) 25 MG tablet Take 25 mg by mouth daily.     Multiple Vitamins-Minerals (ICAPS AREDS 2 PO) Take 1 capsule by mouth daily.     MYRBETRIQ 25 MG TB24 tablet Take 25 mg by mouth daily.     nitroGLYCERIN (NITROSTAT) 0.4 MG SL tablet DISSOLVE 1 TABLET UNDER TONGUE AS NEEDEDFOR CHEST PAIN. MAY REPEAT 5 MINUTES APART 3 TIMES IF NEEDED 25 tablet 0   ondansetron (ZOFRAN) 4 MG tablet Take 1 tablet (4 mg total) by mouth every 6 (six) hours. 12 tablet 0   OneTouch Delica Lancets 82N MISC 1 each by In Vitro route daily. Dx Code E11.49 100 each 3   OXYGEN Inhale 2 L into the lungs at bedtime. May use in the day time if he gets "winded"     pantoprazole (PROTONIX) 40 MG tablet TAKE 1 TABLET BY MOUTH TWICE (2) DAILY 180 tablet 3   polyethylene glycol (MIRALAX / GLYCOLAX) 17 g packet Take 17 g by mouth every other day. (Patient taking differently: Take 17 g by mouth daily.) 1 each 0   pregabalin (LYRICA) 50 MG capsule Take 1 capsule (50 mg total) by mouth 2 (two) times daily. 60 capsule 5   tamsulosin (FLOMAX) 0.4 MG CAPS capsule Take 0.4 mg by  mouth at bedtime.     clopidogrel (PLAVIX) 75 MG tablet Take 1 tablet (75 mg total) by mouth daily. (Patient not taking: Reported on 10/07/2021) 90 tablet 3   No current facility-administered medications on file prior to visit.    Allergies  Allergen Reactions   Metronidazole     Kidney Dr does not want pt to take    Doxazosin Mesylate Other (See Comments)    dizziness   Methocarbamol Rash    Past Medical History:  Diagnosis Date   Aortic stenosis    Arthritis    osteoarthritis, s/p R TKR, and digits   CAD (coronary artery disease)    a. s/p CABG (2001)  b. s/p DES to RCA and cutting POBA to ostial PDA (2013)   c. s/p DES to SVG to OM2 (01/14/14) d. cath: 08/2015 NSTEMI w/ patent LIMA-LAD and 99% stenosis of SVG-OM w/ DES placed. CTO of SVG-RCA and SVG-D1.    Cancer [facility])    bladder  and skin   Cataract     Chronic diastolic CHF (congestive heart failure) ([place])    a) 09/13 ECHO- LVEF 09-62%, grade 1 diastolic dysfunction, mild LA dilatation, atrial septal aneurysm, AV mobility restricted, but no sig AS by doppler; b) 09/04/08 ECHO- LVH, ef 60%, mild AS, c. echo 08/2015: EF perserved of 55-60% with inferolateral HK. Mild AS noted.   Chronic kidney disease, stage III (moderate) ([name])    Chronic lower back pain    Colon polyps    COVID-19    Diverticulosis    Dyspnea 2009 since July -Sept   05/06/08-CPST-  normal effort, reduced VO2 max 20.5 /65%, reduced at 8.2/ 40%, normal breathing resetvca of 55%, submaximal heart rate response 112/77%, flattened o2 pluse response at peak exercise-12 ml/beat @ 85%, No VQ mismatch abnormalities, All c/w CIRC Limitation   Enlarged prostate    Esophageal stricture    a. s/p dilation spring 2010   GERD (gastroesophageal reflux disease)    Heart murmur    Hiatal hernia    History of carpal tunnel syndrome    Bilateral   History of kidney stones    History of PFTs    mixed pattern on spiro. mild restn on lung volumes with near normal DLCO. Pattern can be explained by CABG scar. Fev1 2.2L/73%, ratio 68 (67), TLC 4.7/68%,RV 1.5L/55%,DLCO 79%   Hyperlipidemia    Hypertension    Interstitial lung disease ([name])    NOS   Iron deficiency anemia    Myocardial infarction ([name])    Nausea & vomiting    2018/2019   On home oxygen therapy    2 L [place] at bedtime   Osteoporosis    Overweight (BMI 25.0-29.9)    BMI 29   Peripheral neuropathy    RA (rheumatoid arthritis) ([place])    [name]   Seropositive rheumatoid arthritis ([place])    Type II diabetes mellitus ([place])    diet controlled   Wears glasses    Wears partial dentures    upper    Past Surgical History:  Procedure Laterality Date   BALLOON DILATION N/A 09/12/2018   Procedure: BALLOON DILATION;  Surgeon: Irene Shipper, MD;  [facility]: [facility];  Service: Endoscopy;  Laterality: N/A;   BALLOON DILATION N/A  02/20/2020   Procedure: BALLOON DILATION;  Surgeon: Irene Shipper, MD;  [facility];  Service: Endoscopy;  Laterality: N/A;   BALLOON DILATION N/A 08/24/2021   Procedure: BALLOON DILATION;  Surgeon: [name]  N, MD;  [facility];  Service: Gastroenterology;  Laterality: N/A;   BOTOX INJECTION N/A 09/12/2018   Procedure: BOTOX INJECTION;  Surgeon: Irene Shipper, MD;  [facility];  Service: Endoscopy;  Laterality: N/A;   BOTOX INJECTION N/A 11/27/2018   Procedure: BOTOX INJECTION;  Surgeon: Irene Shipper, MD;  [facility];  Service: Endoscopy;  Laterality: N/A;   BOTOX INJECTION N/A 02/20/2020   Procedure: BOTOX INJECTION;  Surgeon: Irene Shipper, MD;  [facility];  Service: Endoscopy;  Laterality: N/A;   BOTOX INJECTION  08/24/2021   Procedure: BOTOX INJECTION;  Surgeon: Irene Shipper, MD;  [facility];  Service: Gastroenterology;;   CARDIAC CATHETERIZATION  08/2004   CP- no MI, Cath- small vessell disease    CARDIAC CATHETERIZATION  12/31/2011   80% distal LM, 100% native LAD, LCx and RCA, 30% prox SVG-OM, SVG-D1 normal, 99% distal, 80% ostial SVG-RCA distal to graft, LIMA-LAD normal; LVEF mildly decreased with posterior basal AK    CARDIAC CATHETERIZATION  2009   with patent grafts/notes 12/31/2011   CARDIAC CATHETERIZATION N/A 08/13/2015   Procedure: Left Heart Cath and Cors/Grafts Angiography;  Surgeon: Sherren Mocha, MD;  Location: [place] [facility];  Service: Cardiovascular;  Laterality: N/A;   CATARACT EXTRACTION W/ INTRAOCULAR LENS  IMPLANT, BILATERAL Bilateral    CHOLECYSTECTOMY OPEN  11/2003   [name]   CORONARY ANGIOPLASTY WITH STENT PLACEMENT  01/03/2012   Successful DES to SVG-RCA and cutting balloon angioplasty ostial  PDA    CORONARY ANGIOPLASTY WITH STENT PLACEMENT  01/14/2014   "1"   CORONARY ARTERY BYPASS GRAFT  11/1999   CABG X5   CORONARY STENT PLACEMENT  02/2012   1 stent and balloon   CYSTOSCOPY WITH BIOPSY  N/A 03/17/2021   Procedure: DIAGNOSTIC CYSTOSCOPY WITH BIOPSY;  Surgeon: Robley Fries, MD;  [facility];  Service: Urology;  Laterality: N/A;  1 HR   CYSTOSCOPY WITH RETROGRADE PYELOGRAM, URETEROSCOPY AND STENT PLACEMENT Right 07/28/2021   Procedure: CYSTOSCOPY WITH FULGERATION, STENT PLACEMENT, RETROGRADE PYELOGRAM,  DIAGNOSTIC URETEROSCOPY;  Surgeon: Robley Fries, MD;  [facility];  Service: Urology;  Laterality: Right;   ESOPHAGEAL DILATION  11/27/2018   Procedure: ESOPHAGEAL DILATION;  Surgeon: Irene Shipper, MD;  [facility];  Service: Endoscopy;;   ESOPHAGOGASTRODUODENOSCOPY N/A 03/01/2017   Procedure: ESOPHAGOGASTRODUODENOSCOPY (EGD);  Surgeon: Irene Shipper, MD;  [facility];  Service: Endoscopy;  Laterality: N/A;   ESOPHAGOGASTRODUODENOSCOPY (EGD) WITH ESOPHAGEAL DILATION  2010   ESOPHAGOGASTRODUODENOSCOPY (EGD) WITH PROPOFOL N/A 09/12/2018   Procedure: ESOPHAGOGASTRODUODENOSCOPY (EGD) WITH PROPOFOL;  Surgeon: Irene Shipper, MD;  [facility];  Service: Endoscopy;  Laterality: N/A;   ESOPHAGOGASTRODUODENOSCOPY (EGD) WITH PROPOFOL N/A 11/27/2018   Procedure: ESOPHAGOGASTRODUODENOSCOPY (EGD) WITH PROPOFOL, WITH BALLOON DILATION;  Surgeon: Irene Shipper, MD;  [facility];  Service: Endoscopy;  Laterality: N/A;   ESOPHAGOGASTRODUODENOSCOPY (EGD) WITH PROPOFOL N/A 02/20/2020   Procedure: ESOPHAGOGASTRODUODENOSCOPY (EGD) WITH PROPOFOL;  Surgeon: Irene Shipper, MD;  [facility];  Service: Endoscopy;  Laterality: N/A;   ESOPHAGOGASTRODUODENOSCOPY (EGD) WITH PROPOFOL N/A 08/24/2021   Procedure: ESOPHAGOGASTRODUODENOSCOPY (EGD) WITH PROPOFOL;  Surgeon: Irene Shipper, MD;  [facility];  Service: Gastroenterology;  Laterality: N/A;   EXTRACORPOREAL SHOCK WAVE LITHOTRIPSY Right 05/11/2021   Procedure: EXTRACORPOREAL SHOCK WAVE LITHOTRIPSY (ESWL);  Surgeon: [name], MD;  Location: [facility];   Service: Urology;  Laterality: Right;   HAND SURGERY  bilateral carpal tunnel releases   JOINT REPLACEMENT     KNEE ARTHROSCOPY Right 2008   LEFT AND RIGHT HEART CATHETERIZATION WITH CORONARY ANGIOGRAM  12/31/2011   Procedure: LEFT AND RIGHT HEART CATHETERIZATION WITH CORONARY ANGIOGRAM;  Surgeon: Burnell Blanks, MD;  Location: [facility] CATH LAB;  Service: Cardiovascular;;   LEFT AND RIGHT HEART CATHETERIZATION WITH CORONARY ANGIOGRAM N/A 01/14/2014   Procedure: LEFT AND RIGHT HEART CATHETERIZATION WITH CORONARY ANGIOGRAM;  Surgeon: Peter M Martinique, MD;  Location: [facility] CATH LAB;  Service: Cardiovascular;  Laterality: N/A;   MALONEY DILATION  03/01/2017   Procedure: Venia Minks DILATION;  Surgeon: Irene Shipper, MD;  [facility];  Service: Endoscopy;;   PERCUTANEOUS CORONARY INTERVENTION-BALLOON ONLY  01/03/2012   Procedure: PERCUTANEOUS CORONARY INTERVENTION-BALLOON ONLY;  Surgeon: Peter M Martinique, MD;  Location: [facility] CATH LAB;  Service: Cardiovascular;;   PERCUTANEOUS CORONARY STENT INTERVENTION (PCI-S)  12/31/2011   Procedure: PERCUTANEOUS CORONARY STENT INTERVENTION (PCI-S);  Surgeon: Burnell Blanks, MD;  Location: [facility] CATH LAB;  Service: Cardiovascular;;   PERCUTANEOUS CORONARY STENT INTERVENTION (PCI-S) N/A 01/03/2012   Procedure: PERCUTANEOUS CORONARY STENT INTERVENTION (PCI-S);  Surgeon: Peter M Martinique, MD;  Location: [facility] CATH LAB;  Service: Cardiovascular;  Laterality: N/A;   SHOULDER ARTHROSCOPY WITH OPEN ROTATOR CUFF REPAIR AND DISTAL CLAVICLE ACROMINECTOMY Left 02/27/2013   Procedure: LEFT SHOULDER ARTHROSCOPY WITH MINI OPEN ROTATOR CUFF REPAIR AND SUBACROMIAL DECOMPRESSION AND DISTAL CLAVICLE RESECTION;  Surgeon: [name], MD;  Location: [place];  Service: Orthopedics;  Laterality: Left;   TOTAL KNEE ARTHROPLASTY Right 03/2010   [name]   TRANSURETHRAL RESECTION OF BLADDER TUMOR N/A 03/17/2021   Procedure: TRANSURETHRAL RESECTION OF BLADDER TUMOR (TURBT);  Surgeon:  Robley Fries, MD;  [facility];  Service: Urology;  Laterality: N/A;   TRANSURETHRAL RESECTION OF BLADDER TUMOR WITH MITOMYCIN-C N/A 10/08/2020   Procedure: TRANSURETHRAL RESECTION OF BLADDER TUMOR WITH GEMCITABINE;  Surgeon: Robley Fries, MD;  [facility];  Service: Urology;  Laterality: N/A;  75 MINS   TRIGGER FINGER RELEASE Left 02/27/2013   Procedure: RELEASE TRIGGER FINGER/A-1 PULLEY;  Surgeon: [name], MD;  Location: [place];  Service: Orthopedics;  Laterality: Left;    Family History  Problem Relation Age of Onset   COPD Mother    Heart disease Father    Heart attack Father    Stomach cancer Brother    Stroke Sister    Alcohol abuse Sister    Colon cancer Brother 50   Diabetes Brother    Rectal cancer Neg Hx     Social History   Socioeconomic History   Marital status: Married    Spouse name: Not on file   Number of children: 3   Years of education: Not on file   Highest education level: Not on file  Occupational History   Occupation: Designer, jewellery: RETIRED    Comment: retired  Tobacco Use   Smoking status: Former    Packs/day: 1.00    Years: 20.00    Total pack years: 20.00    Types: Cigarettes    Quit date: 04/06/1963    Years since quitting: 58.5    Passive exposure: Current   Smokeless tobacco: Former  Scientific laboratory technician Use: Never used  Substance and Sexual Activity   Alcohol use: No    Alcohol/week: 0.0 standard drinks of alcohol    Comment: 01/01/2012 "last alcohol ~ 50 yr ago"   Drug use: No  Sexual activity: Not Currently  Other Topics Concern   Not on file  Social History Narrative   No living will   Requests wife as health care POA-- alternate is daughter Hassan Rowan   Discussed DNR --he requests this (done 08/29/12)   Not sure about feeding tube---but might accept for some time   Patient lives with wife and daughter in a one story home.  Has 3 children.  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       Right Handed    Social Determinants of Health   Financial Resource Strain: Low Risk  (07/25/2020)   Overall Financial Resource Strain (CARDIA)    Difficulty of Paying Living Expenses: Not very hard  Food Insecurity: Not on file  Transportation Needs: Not on file  Physical Activity: Not on file  Stress: Not on file  Social Connections: Not on file  Intimate Partner Violence: Not on file   Review of Systems No cough Stable DOE Not sleeping well Is eating fair---but gets nausea and then has to stop No weight loss Pain seems to respond to narcotics---??related to nausea (has cut back to just at nighttime)    Objective:   Physical Exam Constitutional:      Appearance: Normal appearance.  Cardiovascular:     Rate and Rhythm: Normal rate and regular rhythm.     Heart sounds: No murmur heard.    No gallop.  Pulmonary:     Effort: Pulmonary effort is normal.     Breath sounds: Normal breath sounds. No wheezing or rales.  Abdominal:     Palpations: Abdomen is soft.     Tenderness: There is no abdominal tenderness.  Musculoskeletal:     Cervical back: Neck supple.     Right lower leg: No edema.     Left lower leg: No edema.  Lymphadenopathy:     Cervical: No cervical adenopathy.  Neurological:     Mental Status: He is alert.            Assessment & Plan:

## 2021-10-07 NOTE — Assessment & Plan Note (Signed)
Likely multifactorial I am concerned about his polypharmacy No longer having dysphagia--so I don't think this is GI Will review his pain meds

## 2021-10-07 NOTE — Assessment & Plan Note (Signed)
Unclear how much things are helping Asked him to stay on lower lyrica dose 25 bid Cut the hydrocodone 10 to 1/2 at bedtime

## 2021-10-09 ENCOUNTER — Encounter: Payer: Self-pay | Admitting: Internal Medicine

## 2021-10-09 LAB — URINE CULTURE
MICRO NUMBER:: 13606670
SPECIMEN QUALITY:: ADEQUATE

## 2021-10-12 ENCOUNTER — Encounter: Payer: Self-pay | Admitting: Student in an Organized Health Care Education/Training Program

## 2021-10-12 ENCOUNTER — Telehealth: Payer: Self-pay

## 2021-10-12 DIAGNOSIS — E1142 Type 2 diabetes mellitus with diabetic polyneuropathy: Secondary | ICD-10-CM

## 2021-10-12 DIAGNOSIS — M792 Neuralgia and neuritis, unspecified: Secondary | ICD-10-CM

## 2021-10-12 NOTE — Telephone Encounter (Signed)
Left detailed message on VM for daughter.

## 2021-10-12 NOTE — Telephone Encounter (Signed)
[name] is calling in stating that he was seen last week and they lowered his pain medication and due to this he is having withdrawals. [name] is wondering if there is anything that we can do to help this, but [name] states she doesn't really want this for him as it would be something else to be addicted to but would like Dr.letvak's advice.

## 2021-10-12 NOTE — Addendum Note (Signed)
Addended by: Pilar Grammes on: 10/12/2021 04:44 PM   Modules accepted: Orders

## 2021-10-12 NOTE — Telephone Encounter (Signed)
[name] called back. Said he is shaking, crying, afraid to be left alone. She is pretty sure he is taking more than 1 tablet of hydrocodone daily. Most likely 3  day, maybe 4. Lyrica '50mg'$  in am and pm. How should he be weaned from 3 daily.

## 2021-10-12 NOTE — Telephone Encounter (Signed)
Spoke to pt's daughter, [name]. She will have him take 1 hydrocodone in the am and 1 in pm

## 2021-10-13 MED ORDER — DULOXETINE HCL 20 MG PO CPEP: 20.0000 mg | ORAL_CAPSULE | Freq: Every day | ORAL | 2 refills | Status: DC

## 2021-10-15 ENCOUNTER — Encounter: Payer: Self-pay | Admitting: Student in an Organized Health Care Education/Training Program

## 2021-10-15 DIAGNOSIS — J8410 Pulmonary fibrosis, unspecified: Secondary | ICD-10-CM | POA: Diagnosis not present

## 2021-10-15 DIAGNOSIS — R0689 Other abnormalities of breathing: Secondary | ICD-10-CM | POA: Diagnosis not present

## 2021-10-20 ENCOUNTER — Encounter: Payer: Self-pay | Admitting: Student in an Organized Health Care Education/Training Program

## 2021-10-28 ENCOUNTER — Ambulatory Visit: Payer: Medicare Other | Admitting: Physician Assistant

## 2021-10-28 DIAGNOSIS — L905 Scar conditions and fibrosis of skin: Secondary | ICD-10-CM | POA: Diagnosis not present

## 2021-10-28 DIAGNOSIS — L989 Disorder of the skin and subcutaneous tissue, unspecified: Secondary | ICD-10-CM | POA: Diagnosis not present

## 2021-10-28 DIAGNOSIS — D0339 Melanoma in situ of other parts of face: Secondary | ICD-10-CM | POA: Diagnosis not present

## 2021-11-02 DIAGNOSIS — R300 Dysuria: Secondary | ICD-10-CM | POA: Diagnosis not present

## 2021-11-02 DIAGNOSIS — C674 Malignant neoplasm of posterior wall of bladder: Secondary | ICD-10-CM | POA: Diagnosis not present

## 2021-11-02 DIAGNOSIS — N130 Hydronephrosis with ureteropelvic junction obstruction: Secondary | ICD-10-CM | POA: Diagnosis not present

## 2021-11-09 ENCOUNTER — Encounter: Payer: Self-pay | Admitting: Internal Medicine

## 2021-11-09 ENCOUNTER — Ambulatory Visit (INDEPENDENT_AMBULATORY_CARE_PROVIDER_SITE_OTHER): Payer: Medicare Other | Admitting: Internal Medicine

## 2021-11-09 DIAGNOSIS — R300 Dysuria: Secondary | ICD-10-CM

## 2021-11-09 DIAGNOSIS — F1120 Opioid dependence, uncomplicated: Secondary | ICD-10-CM | POA: Diagnosis not present

## 2021-11-09 NOTE — Assessment & Plan Note (Signed)
Ongoing pain Some degree of retention Doesn't want Foley Okay to try cranberry and pyridium

## 2021-11-09 NOTE — Progress Notes (Signed)
Subjective:    Patient ID: [name], male    DOB: 09/14/1931, 88 y.o.   MRN: [id number]  HPI Here due to continued dysuria With daughter  [facility] note from last week  "My bladder hurts" Better if he is lying down Has to void every hour or so Didn't want offered Foley  All this goes back to when he had the resection of the bladder tumor Family trying to have him drink more water and avoiding caffeine  Ongoing neuropathy---lyrica has not been helping It makes him tired when he takes it Does get relief from the hydrocodone  Had felt better off the hydrocodone----but needed it for pain  Current Outpatient Medications on File Prior to Visit  Medication Sig Dispense Refill   amLODipine (NORVASC) 5 MG tablet Take 5 mg by mouth at bedtime.     aspirin EC 81 MG tablet Take 81 mg by mouth at bedtime.     Blood Glucose Monitoring Suppl (ONE TOUCH ULTRA 2) w/Device KIT Use to obtain blood sugar daily. Dx Code E11.40 1 kit 0   clopidogrel (PLAVIX) 75 MG tablet Take 1 tablet (75 mg total) by mouth daily. 90 tablet 3   Ferrous Sulfate 28 MG TABS Take 28 mg by mouth 2 (two) times a week.     finasteride (PROSCAR) 5 MG tablet Take 5 mg by mouth daily.     fluticasone (CUTIVATE) 0.05 % cream Apply 1 application. topically daily as needed (irritation).     glucose blood (ONE TOUCH ULTRA TEST) test strip USE TO CHECK BLOOD SUGAR ONCE A DAY Dx Code E11.40 100 each 3   HYDROcodone-acetaminophen (NORCO) 10-325 MG tablet Take 1 tablet by mouth every 6 (six) hours as needed for severe pain. Must last 30 days. 120 tablet 0   [START ON 11/19/2021] HYDROcodone-acetaminophen (NORCO) 10-325 MG tablet Take 1 tablet by mouth every 6 (six) hours as needed for severe pain. Must last 30 days. 120 tablet 0   ketoconazole (NIZORAL) 2 % shampoo Apply 1 application. topically 2 (two) times a week.     losartan (COZAAR) 25 MG tablet Take 25 mg by mouth daily.     Multiple Vitamins-Minerals  (ICAPS AREDS 2 PO) Take 1 capsule by mouth daily.     nitroGLYCERIN (NITROSTAT) 0.4 MG SL tablet DISSOLVE 1 TABLET UNDER TONGUE AS NEEDEDFOR CHEST PAIN. MAY REPEAT 5 MINUTES APART 3 TIMES IF NEEDED 25 tablet 0   OneTouch Delica Lancets 91T MISC 1 each by In Vitro route daily. Dx Code E11.49 100 each 3   OXYGEN Inhale 2 L into the lungs at bedtime. May use in the day time if he gets "winded"     pantoprazole (PROTONIX) 40 MG tablet TAKE 1 TABLET BY MOUTH TWICE (2) DAILY 180 tablet 3   polyethylene glycol (MIRALAX / GLYCOLAX) 17 g packet Take 17 g by mouth every other day. (Patient taking differently: Take 17 g by mouth daily.) 1 each 0   tamsulosin (FLOMAX) 0.4 MG CAPS capsule Take 0.4 mg by mouth at bedtime.     ondansetron (ZOFRAN) 4 MG tablet Take 1 tablet (4 mg total) by mouth every 6 (six) hours. (Patient not taking: Reported on 11/09/2021) 12 tablet 0   No current facility-administered medications on file prior to visit.    Allergies  Allergen Reactions   Metronidazole     Kidney Dr does not want pt to take    Doxazosin Mesylate Other (See Comments)  dizziness   Methocarbamol Rash    Past Medical History:  Diagnosis Date   Aortic stenosis    Arthritis    osteoarthritis, s/p R TKR, and digits   CAD (coronary artery disease)    a. s/p CABG (2001)  b. s/p DES to RCA and cutting POBA to ostial PDA (2013)   c. s/p DES to SVG to OM2 (01/14/14) d. cath: 08/2015 NSTEMI w/ patent LIMA-LAD and 99% stenosis of SVG-OM w/ DES placed. CTO of SVG-RCA and SVG-D1.    Cancer [facility])    bladder  and skin   Cataract    Chronic diastolic CHF (congestive heart failure) ([name])    a) 09/13 ECHO- LVEF 50-55%, grade 1 diastolic dysfunction, mild LA dilatation, atrial septal aneurysm, AV mobility restricted, but no sig AS by doppler; b) 09/04/08 ECHO- LVH, ef 60%, mild AS, c. echo 08/2015: EF perserved of 55-60% with inferolateral HK. Mild AS noted.   Chronic kidney disease, stage III (moderate) ([name])     Chronic lower back pain    Colon polyps    COVID-19    Diverticulosis    Dyspnea 2009 since July -Sept   05/06/08-CPST-  normal effort, reduced VO2 max 20.5 /65%, reduced at 8.2/ 40%, normal breathing resetvca of 55%, submaximal heart rate response 112/77%, flattened o2 pluse response at peak exercise-12 ml/beat @ 85%, No VQ mismatch abnormalities, All c/w CIRC Limitation   Enlarged prostate    Esophageal stricture    a. s/p dilation spring 2010   GERD (gastroesophageal reflux disease)    Heart murmur    Hiatal hernia    History of carpal tunnel syndrome    Bilateral   History of kidney stones    History of PFTs    mixed pattern on spiro. mild restn on lung volumes with near normal DLCO. Pattern can be explained by CABG scar. Fev1 2.2L/73%, ratio 68 (67), TLC 4.7/68%,RV 1.5L/55%,DLCO 79%   Hyperlipidemia    Hypertension    Interstitial lung disease ([name])    NOS   Iron deficiency anemia    Myocardial infarction ([name])    Nausea & vomiting    2018/2019   On home oxygen therapy    2 L  at bedtime   Osteoporosis    Overweight (BMI 25.0-29.9)    BMI 29   Peripheral neuropathy    RA (rheumatoid arthritis) ([name])    Dr Titus Dubin   Seropositive rheumatoid arthritis ([name])    Type II diabetes mellitus ([name])    diet controlled   Wears glasses    Wears partial dentures    upper    Past Surgical History:  Procedure Laterality Date   BALLOON DILATION N/A 09/12/2018   Procedure: BALLOON DILATION;  Surgeon: Hilarie Fredrickson, MD;  Location: [facility];  Service: Endoscopy;  Laterality: N/A;   BALLOON DILATION N/A 02/20/2020   Procedure: BALLOON DILATION;  Surgeon: Hilarie Fredrickson, MD;  Location: [facility];  Service: Endoscopy;  Laterality: N/A;   BALLOON DILATION N/A 08/24/2021   Procedure: BALLOON DILATION;  Surgeon: Hilarie Fredrickson, MD;  Location: [facility];  Service: Gastroenterology;  Laterality: N/A;   BOTOX INJECTION N/A 09/12/2018   Procedure: BOTOX INJECTION;  Surgeon: Hilarie Fredrickson, MD;  [facility];  Service: Endoscopy;  Laterality: N/A;   BOTOX INJECTION N/A 11/27/2018   Procedure: BOTOX INJECTION;  Surgeon: Hilarie Fredrickson, MD;  [facility];  Service: Endoscopy;  Laterality: N/A;   BOTOX INJECTION N/A 02/20/2020  Procedure: BOTOX INJECTION;  Surgeon: Irene Shipper, MD;  Location[facility];  Service: Endoscopy;  Laterality: N/A;   BOTOX INJECTION  08/24/2021   Procedure: BOTOX INJECTION;  Surgeon: Irene Shipper, MD;  [facility];  Service: Gastroenterology;;   CARDIAC CATHETERIZATION  08/2004   CP- no MI, Cath- small vessell disease    CARDIAC CATHETERIZATION  12/31/2011   80% distal LM, 100% native LAD, LCx and RCA, 30% prox SVG-OM, SVG-D1 normal, 99% distal, 80% ostial SVG-RCA distal to graft, LIMA-LAD normal; LVEF mildly decreased with posterior basal AK    CARDIAC CATHETERIZATION  2009   with patent grafts/notes 12/31/2011   CARDIAC CATHETERIZATION N/A 08/13/2015   Procedure: Left Heart Cath and Cors/Grafts Angiography;  Surgeon: Sherren Mocha, MD;  Location: [place] [facility];  Service: Cardiovascular;  Laterality: N/A;   CATARACT EXTRACTION W/ INTRAOCULAR LENS  IMPLANT, BILATERAL Bilateral    CHOLECYSTECTOMY OPEN  11/2003   [name]   CORONARY ANGIOPLASTY WITH STENT PLACEMENT  01/03/2012   Successful DES to SVG-RCA and cutting balloon angioplasty ostial  PDA    CORONARY ANGIOPLASTY WITH STENT PLACEMENT  01/14/2014   "1"   CORONARY ARTERY BYPASS GRAFT  11/1999   CABG X5   CORONARY STENT PLACEMENT  02/2012   1 stent and balloon   CYSTOSCOPY WITH BIOPSY N/A 03/17/2021   Procedure: DIAGNOSTIC CYSTOSCOPY WITH BIOPSY;  Surgeon: Robley Fries, MD;  [facility];  Service: Urology;  Laterality: N/A;  1 HR   CYSTOSCOPY WITH RETROGRADE PYELOGRAM, URETEROSCOPY AND STENT PLACEMENT Right 07/28/2021   Procedure: CYSTOSCOPY WITH FULGERATION, STENT PLACEMENT, RETROGRADE PYELOGRAM,  DIAGNOSTIC URETEROSCOPY;  Surgeon: Robley Fries,  MD;  [facility];  Service: Urology;  Laterality: Right;   ESOPHAGEAL DILATION  11/27/2018   Procedure: ESOPHAGEAL DILATION;  Surgeon: Irene Shipper, MD;  [facility];  Service: Endoscopy;;   ESOPHAGOGASTRODUODENOSCOPY N/A 03/01/2017   Procedure: ESOPHAGOGASTRODUODENOSCOPY (EGD);  Surgeon: Irene Shipper, MD;  [facility];  Service: Endoscopy;  Laterality: N/A;   ESOPHAGOGASTRODUODENOSCOPY (EGD) WITH ESOPHAGEAL DILATION  2010   ESOPHAGOGASTRODUODENOSCOPY (EGD) WITH PROPOFOL N/A 09/12/2018   Procedure: ESOPHAGOGASTRODUODENOSCOPY (EGD) WITH PROPOFOL;  Surgeon: Irene Shipper, MD;  [facility];  Service: Endoscopy;  Laterality: N/A;   ESOPHAGOGASTRODUODENOSCOPY (EGD) WITH PROPOFOL N/A 11/27/2018   Procedure: ESOPHAGOGASTRODUODENOSCOPY (EGD) WITH PROPOFOL, WITH BALLOON DILATION;  Surgeon: Irene Shipper, MD;  [facility];  Service: Endoscopy;  Laterality: N/A;   ESOPHAGOGASTRODUODENOSCOPY (EGD) WITH PROPOFOL N/A 02/20/2020   Procedure: ESOPHAGOGASTRODUODENOSCOPY (EGD) WITH PROPOFOL;  Surgeon: Irene Shipper, MD;  [facility];  Service: Endoscopy;  Laterality: N/A;   ESOPHAGOGASTRODUODENOSCOPY (EGD) WITH PROPOFOL N/A 08/24/2021   Procedure: ESOPHAGOGASTRODUODENOSCOPY (EGD) WITH PROPOFOL;  Surgeon: Irene Shipper, MD;  [facility];  Service: Gastroenterology;  Laterality: N/A;   EXTRACORPOREAL SHOCK WAVE LITHOTRIPSY Right 05/11/2021   Procedure: EXTRACORPOREAL SHOCK WAVE LITHOTRIPSY (ESWL);  Surgeon: [name], MD;  [facility];  Service: Urology;  Laterality: Right;   HAND SURGERY     bilateral carpal tunnel releases   JOINT REPLACEMENT     KNEE ARTHROSCOPY Right 2008   LEFT AND RIGHT HEART CATHETERIZATION WITH CORONARY ANGIOGRAM  12/31/2011   Procedure: LEFT AND RIGHT HEART CATHETERIZATION WITH CORONARY ANGIOGRAM;  Surgeon: Burnell Blanks, MD;  Location: [facility] C[facility] LAB;  Service: Cardiovascular;;    LEFT AND RIGHT HEART CATHETERIZATION WITH CORONARY ANGIOGRAM N/A 01/14/2014   Procedure: LEFT AND RIGHT HEART  CATHETERIZATION WITH CORONARY ANGIOGRAM;  Surgeon: Peter M Martinique, MD;  Location: [facility] CATH LAB;  Service: Cardiovascular;  Laterality: N/A;   MALONEY DILATION  03/01/2017   Procedure: Venia Minks DILATION;  Surgeon: Irene Shipper, MD;  [facility];  Service: Endoscopy;;   PERCUTANEOUS CORONARY INTERVENTION-BALLOON ONLY  01/03/2012   Procedure: PERCUTANEOUS CORONARY INTERVENTION-BALLOON ONLY;  Surgeon: Peter M Martinique, MD;  Location: [facility] CATH LAB;  Service: Cardiovascular;;   PERCUTANEOUS CORONARY STENT INTERVENTION (PCI-S)  12/31/2011   Procedure: PERCUTANEOUS CORONARY STENT INTERVENTION (PCI-S);  Surgeon: Burnell Blanks, MD;  Location: [facility] C[facility] LAB;  Service: Cardiovascular;;   PERCUTANEOUS CORONARY STENT INTERVENTION (PCI-S) N/A 01/03/2012   Procedure: PERCUTANEOUS CORONARY STENT INTERVENTION (PCI-S);  Surgeon: Peter M Martinique, MD;  Location: [facility] CATH LAB;  Service: Cardiovascular;  Laterality: N/A;   SHOULDER ARTHROSCOPY WITH OPEN ROTATOR CUFF REPAIR AND DISTAL CLAVICLE ACROMINECTOMY Left 02/27/2013   Procedure: LEFT SHOULDER ARTHROSCOPY WITH MINI OPEN ROTATOR CUFF REPAIR AND SUBACROMIAL DECOMPRESSION AND DISTAL CLAVICLE RESECTION;  Surgeon: [name], MD;  Location: [facility];  Service: Orthopedics;  Laterality: Left;   TOTAL KNEE ARTHROPLASTY Right 03/2010   [name]   TRANSURETHRAL RESECTION OF BLADDER TUMOR N/A 03/17/2021   Procedure: TRANSURETHRAL RESECTION OF BLADDER TUMOR (TURBT);  Surgeon: Robley Fries, MD;  [facility];  Service: Urology;  Laterality: N/A;   TRANSURETHRAL RESECTION OF BLADDER TUMOR WITH MITOMYCIN-C N/A 10/08/2020   Procedure: TRANSURETHRAL RESECTION OF BLADDER TUMOR WITH GEMCITABINE;  Surgeon: Robley Fries, MD;  Location[facility];  Service: Urology;  Laterality: N/A;  75 MINS   TRIGGER FINGER RELEASE Left 02/27/2013   Procedure: RELEASE  TRIGGER FINGER/A-1 PULLEY;  Surgeon: [name], MD;  Location: [place];  Service: Orthopedics;  Laterality: Left;    Family History  Problem Relation Age of Onset   COPD Mother    Heart disease Father    Heart attack Father    Stomach cancer Brother    Stroke Sister    Alcohol abuse Sister    Colon cancer Brother 31   Diabetes Brother    Rectal cancer Neg Hx     Social History   Socioeconomic History   Marital status: Married    Spouse name: Not on file   Number of children: 3   Years of education: Not on file   Highest education level: Not on file  Occupational History   Occupation: Designer, jewellery: RETIRED    Comment: retired  Tobacco Use   Smoking status: Former    Packs/day: 1.00    Years: 20.00    Total pack years: 20.00    Types: Cigarettes    Quit date: 04/06/1963    Years since quitting: 58.6    Passive exposure: Current   Smokeless tobacco: Former  Scientific laboratory technician Use: Never used  Substance and Sexual Activity   Alcohol use: No    Alcohol/week: 0.0 standard drinks of alcohol    Comment: 01/01/2012 "last alcohol ~ 50 yr ago"   Drug use: No   Sexual activity: Not Currently  Other Topics Concern   Not on file  Social History Narrative   No living will   Requests wife as health care POA-- alternate is daughter Hassan Rowan   Discussed DNR --he requests this (done 08/29/12)   Not sure about feeding tube---but might accept for some time   Patient lives with wife and daughter in a one story home.  Has  3 children.  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      Right Handed    Social Determinants of Health   Financial Resource Strain: Low Risk  (07/25/2020)   Overall Financial Resource Strain (CARDIA)    Difficulty of Paying Living Expenses: Not very hard  Food Insecurity: Not on file  Transportation Needs: Not on file  Physical Activity: Not on file  Stress: Not on file  Social Connections: Not on file  Intimate Partner  Violence: Not on file   Review of Systems No fever No vomiting--but stomach is unsettled    Objective:   Physical Exam Constitutional:      Comments: Looks groggy and slightly slurred speech  Abdominal:     Palpations: Abdomen is soft.     Comments: Mild suprapubic tenderness  Neurological:     Mental Status: He is alert.            Assessment & Plan:

## 2021-11-09 NOTE — Assessment & Plan Note (Signed)
It would be best to wean down to lowest tolerable dose of hydrocodone. Fort now--10/325 bid Wean off the lyrica---doesn't seem to be helping

## 2021-11-09 NOTE — Patient Instructions (Signed)
Cut the lyrica to just at bedtime for the next week---if no significant increase in pain, stop it then.  Continue the hydrocodone 10/325 twice a day for now. Once you are off the lyrica, you can try cutting the hydrocodone in 1/2 ('5mg'$ ) and taking it when you need it (even more than twice a day if needed).  It would be okay to try over the counter cranberry tabs and pyridium as needed for the bladder pain.

## 2021-11-12 ENCOUNTER — Ambulatory Visit: Payer: Medicare Other | Admitting: Podiatry

## 2021-11-15 DIAGNOSIS — R0689 Other abnormalities of breathing: Secondary | ICD-10-CM | POA: Diagnosis not present

## 2021-11-15 DIAGNOSIS — J8410 Pulmonary fibrosis, unspecified: Secondary | ICD-10-CM | POA: Diagnosis not present

## 2021-11-19 DIAGNOSIS — L570 Actinic keratosis: Secondary | ICD-10-CM | POA: Diagnosis not present

## 2021-11-19 DIAGNOSIS — Z09 Encounter for follow-up examination after completed treatment for conditions other than malignant neoplasm: Secondary | ICD-10-CM | POA: Diagnosis not present

## 2021-11-19 DIAGNOSIS — Z86007 Personal history of in-situ neoplasm of skin: Secondary | ICD-10-CM | POA: Diagnosis not present

## 2021-11-19 DIAGNOSIS — Z86006 Personal history of melanoma in-situ: Secondary | ICD-10-CM | POA: Diagnosis not present

## 2021-11-19 DIAGNOSIS — Z08 Encounter for follow-up examination after completed treatment for malignant neoplasm: Secondary | ICD-10-CM | POA: Diagnosis not present

## 2021-11-23 ENCOUNTER — Telehealth: Payer: Self-pay

## 2021-11-23 NOTE — Telephone Encounter (Signed)
[name]'s daughter is calling in stating that for a few months [name] has been feeling nauseous, at the last appointment Dr.[name] took away some of his medications and changed some but it still hasn't helped. Daughter is concerned about a stomach ulcer.

## 2021-11-23 NOTE — Telephone Encounter (Signed)
Spoke to [employer] per [employer]. She will contact Dr Henrene Pastor.

## 2021-11-24 ENCOUNTER — Telehealth: Payer: Self-pay | Admitting: Physician Assistant

## 2021-11-24 NOTE — Telephone Encounter (Signed)
PT's daughter called in about her father's constant nausea. When he lays down he's fine, when he sits up it comes back. She is very concerned and seeking options. Please advise. Thank you.

## 2021-11-25 ENCOUNTER — Ambulatory Visit: Payer: Medicare Other | Admitting: Physician Assistant

## 2021-11-25 NOTE — Telephone Encounter (Signed)
Left message for pt to call back.  Daughter states pts is nauseated almost daily. He is off Lyrica now and decreasing the dose of hydrocodone. When he sits up he or stands he gets nauseated. When he lays down he feels better. Pt had a script for zofran on med list, daughter states he has not been taking it as he does not think it helps. She has tried to get him to take ginger drops and aloe drops when she can get him to do it. She wants to know what [name] recommends. Please advise.

## 2021-11-25 NOTE — Telephone Encounter (Signed)
Last seen by Dr Henrene Pastor.

## 2021-11-26 MED ORDER — ONDANSETRON HCL 4 MG PO TABS: 4.0000 mg | ORAL_TABLET | Freq: Three times a day (TID) | ORAL | 1 refills | Status: DC

## 2021-11-26 NOTE — Telephone Encounter (Signed)
Spoke with pts daughter and she is aware. New prescription of zofran sent in for pt.

## 2021-11-26 NOTE — Telephone Encounter (Signed)
There are innumerable causes for nausea including medical illness and medication. He might consider taking his Zofran running, rather than as needed.  For example, take Zofran 4 mg morning, afternoon, and evening

## 2021-11-27 ENCOUNTER — Telehealth: Payer: Self-pay | Admitting: Internal Medicine

## 2021-11-27 NOTE — Telephone Encounter (Signed)
Patient daughter called in stating that patient is feeling better ,however he just doesn't have any energy. He's been in the bed for the last couple of weeks. She would just like advice on anything he could possibly take to give him some energy?Vitamin B12?

## 2021-11-27 NOTE — Telephone Encounter (Signed)
Spoke to pt's daughter, [name], per [place]. Advised her that Dr Silvio Pate usually recommends 500 mcg of dissolvable B12 or 1000 mcg of regular tab. She will et some for him to try.

## 2021-12-04 DIAGNOSIS — Z85820 Personal history of malignant melanoma of skin: Secondary | ICD-10-CM | POA: Diagnosis not present

## 2021-12-04 DIAGNOSIS — C4339 Malignant melanoma of other parts of face: Secondary | ICD-10-CM | POA: Diagnosis not present

## 2021-12-04 DIAGNOSIS — Z08 Encounter for follow-up examination after completed treatment for malignant neoplasm: Secondary | ICD-10-CM | POA: Diagnosis not present

## 2021-12-04 DIAGNOSIS — L923 Foreign body granuloma of the skin and subcutaneous tissue: Secondary | ICD-10-CM | POA: Diagnosis not present

## 2021-12-09 ENCOUNTER — Encounter: Payer: Self-pay | Admitting: Primary Care

## 2021-12-09 ENCOUNTER — Ambulatory Visit: Payer: Medicare Other | Admitting: Primary Care

## 2021-12-09 DIAGNOSIS — J849 Interstitial pulmonary disease, unspecified: Secondary | ICD-10-CM | POA: Diagnosis not present

## 2021-12-09 MED ORDER — STIOLTO RESPIMAT 2.5-2.5 MCG/ACT IN AERS: 2.0000 | INHALATION_SPRAY | Freq: Every day | RESPIRATORY_TRACT | 0 refills | Status: DC

## 2021-12-09 NOTE — Progress Notes (Signed)
_0  ID: [name], male    DOB: 1931/07/09, 88 y.o.   MRN: [id number]  Chief Complaint  Patient presents with   Follow-up    Referring provider: [name], MD  HPI: 88 year old male, former smoker. PMH significant for ILD, OSA, resp failure. Patient of Dr. Chase Caller.   Previous [facility] pulmonary encounter: 02/20/21- Dr. Chase Caller     Chief Complaint  Patient presents with   Follow-up      Pt states his breathing has gotten worse since last visit. States that he has an occ cough.      Follow-up rheumatoid arthritis with mixed restrictive/obstructive lung function slightly low DLCO and significant shortness of breath             -High-resolution CT chest mild ILD (also has velcro crackles at lung base) with air trapping according to Dr. Chase Caller indeterminate/alternative pattern              -Cylindrical bronchiectasis without evidence of fibrotic lung disease per radiology   Out of proportion dyspnea   HPI [name] 88 y.o. -presents for follow-up.  He presents with his wife and daughter.  Meeting daughter I think for the first time or after a long time.  He again tells me that as always that his dyspnea is getting worse.  He is also more physically deconditioned.  Daughter states he is mostly sedentary does not do any household work.  He does things around the house.  They all agree that the dyspnea is definitely worse.  Daughter wondered if any of the medicines can directly contribute to dyspnea.  I reviewed the medications patient does not have Brilinta but on Plavix.  He is on antihypertensives.  He review of his medical chart indicates that he has diastolic dysfunction, arctic stenosis, anemia, chronic kidney disease - -all of which can contribute to dyspnea.  He also has anemia.  In addition he is more progressively physically deconditioned his albumin is now less than 4 and he is on protein calorie malnutrition.  He when he walks around the house he uses  oxygen for relief but he never desaturates.  This for subjectively.  He continues with nighttime oxygen.  I explained that sometimes for palliative relief of dyspnea morphine would be indicated.  They are not enthused about this option.  At this point in time we will get more work-up done.  Did explain to the multifactorial nature of dyspnea can be hard to solve.   CT Chest data   No results found.   03/25/2021 Patient presents today for virtual visit to review recent CT results. Accompanied by his daughter. He is currently at his baseline. He continues to have progressive dyspnea symptoms. He has not active cough, chest tightness or wheezing. He uses oxygen as needed during daytime and at night. They are not interested in morphine for palliative relief at this time. CT chest showed no evidence of ILD, widespread bronchiectasis most evident throughout mid-lower lungs bilaterally and mild scarring are similar to previous study. Chronic right pleural thickening calcification, similar to prior study. Incidental findings aortic atherosclerosis, left main and three vessel disease. Along with severe calcification of aortic valve. We reviewed these results today.   The acetylcholine receptor antibody slightly positive.  It is a low titer positivity.  I do not know what this means but I did read that it could be other neuromuscular syndromes   The CT chest is reassuring   Unknown for  over 12 years and through all this he has had persistent refractory dyspnea over and above what we can explain with his multiple medical problems.  Plan - Refer to neurology -so we can ensure there is no neuromuscular issues causing dyspnea  12/09/2021 - Interim hx  Patient present today for overdue follow-up.  Patient has longstanding history of progressive shortness of breath.  Most recent CT of chest in November 2022 showed no evidence of ILD, however, there was significant bronchiectasis.  During his last visit  acetylcholine receptor antibody was mildly positive at 28%.  He was referred to neurology.  He saw Dr. Posey Pronto with Neurology, symptoms were not felt to be classic presentation for myasthenia. Antibody felt to be possibly more of a systemic inflammatory response d.t hx RA. It was recommended that he have NCS/EMG testing but patient declined. Neurology recommended empiric trial mestinon 30m 1 hour prior to meals and follow up in 2 weeks after new start of medication. Appears he either did not tolerate medication or saw no improvement while taking. Unclear how long he tried medication. Shortness of breath is unchanged. No worsening symptoms. Low energy. He clears his throat several times throughout the day. Sputum is not purulent that he is aware of. No recent issues with swallowing.  He walks his drive way twice a day approx 400 steps, checks his oxygen with exertion and O2 remains round 95% on room air. He wears oxygen at night. Denies f/c/s, chest tightness, wheezing.  Imaging: 03/04/21 HRCT>> Chronic cylindrical bronchiectasis and mild scarring in the lungs, similar to the prior study. No evidence of interstitial lung disease. Mild air trapping indicative of mild small airways disease. Right pleural thickening calcification, presumably related to remote right-sided pleural hemorrhage and/or pleural infection. Aortic atherosclerosis, in addition to left main and 3 vessel coronary artery disease. There are extremely severe calcifications of the aortic valve  Allergies  Allergen Reactions   Metronidazole     Kidney Dr does not want pt to take    Doxazosin Mesylate Other (See Comments)    dizziness   Methocarbamol Rash    Immunization History  Administered Date(s) Administered   Fluad Quad(high Dose 65+) 12/26/2020, 12/11/2021   H1N1 03/27/2008   Influenza Split 12/29/2010, 01/18/2012   Influenza Whole 02/03/2007, 01/09/2008, 01/01/2009, 12/31/2009   Influenza,inj,Quad PF,6+ Mos 12/13/2012,  01/15/2014, 01/15/2015, 01/06/2016, 01/03/2017, 02/09/2018, 01/30/2019, 01/05/2020   PFIZER(Purple Top)SARS-COV-2 Vaccination 04/26/2019, 05/17/2019   Pneumococcal Conjugate-13 09/10/2013   Pneumococcal Polysaccharide-23 03/05/1997, 08/17/2011   Td 03/05/1997, 08/17/2011    Past Medical History:  Diagnosis Date   Aortic stenosis    Arthritis    osteoarthritis, s/p R TKR, and digits   CAD (coronary artery disease)    a. s/p CABG (2001)  b. s/p DES to RCA and cutting POBA to ostial PDA (2013)   c. s/p DES to SVG to OM2 (01/14/14) d. cath: 08/2015 NSTEMI w/ patent LIMA-LAD and 99% stenosis of SVG-OM w/ DES placed. CTO of SVG-RCA and SVG-D1.    Cancer ([facility]    bladder  and skin   Cataract    Chronic diastolic CHF (congestive heart failure) ([facility]    a) 09/13 ECHO- LVEF 572-53% grade 1 diastolic dysfunction, mild LA dilatation, atrial septal aneurysm, AV mobility restricted, but no sig AS by doppler; b) 09/04/08 ECHO- LVH, ef 60%, mild AS, c. echo 08/2015: EF perserved of 55-60% with inferolateral HK. Mild AS noted.   Chronic kidney disease, stage III (moderate) ([name])    Chronic lower  back pain    Colon polyps    COVID-19    Diverticulosis    Dyspnea 2009 since July -Sept   05/06/08-[facility]-  normal effort, reduced VO2 max 20.5 /65%, reduced at 8.2/ 40%, normal breathing resetvca of 55%, submaximal heart rate response 112/77%, flattened o2 pluse response at peak exercise-12 ml/beat @ 85%, No VQ mismatch abnormalities, All c/w CIRC Limitation   Enlarged prostate    Esophageal stricture    a. s/p dilation spring 2010   GERD (gastroesophageal reflux disease)    Heart murmur    Hiatal hernia    History of carpal tunnel syndrome    Bilateral   History of kidney stones    History of PFTs    mixed pattern on spiro. mild restn on lung volumes with near normal DLCO. Pattern can be explained by CABG scar. Fev1 2.2L/73%, ratio 68 (67), TLC 4.7/68%,RV 1.5L/55%,DLCO 79%   Hyperlipidemia    Hypertension     Interstitial lung disease ([name])    NOS   Iron deficiency anemia    Myocardial infarction ([name])    Nausea & vomiting    2018/2019   On home oxygen therapy    2 L [place] at bedtime   Osteoporosis    Overweight (BMI 25.0-29.9)    BMI 29   Peripheral neuropathy    RA (rheumatoid arthritis) ([place])    [name]   Seropositive rheumatoid arthritis ([place])    Type II diabetes mellitus ([place])    diet controlled   Wears glasses    Wears partial dentures    upper    Tobacco History: Social History   Tobacco Use  Smoking Status Former   Packs/day: 1.00   Years: 20.00   Total pack years: 20.00   Types: Cigarettes   Quit date: 04/06/1963   Years since quitting: 58.7   Passive exposure: Current  Smokeless Tobacco Former   Counseling given: Not Answered   Outpatient Medications Prior to Visit  Medication Sig Dispense Refill   amLODipine (NORVASC) 5 MG tablet Take 5 mg by mouth at bedtime.     aspirin EC 81 MG tablet Take 81 mg by mouth at bedtime.     Blood Glucose Monitoring Suppl (ONE TOUCH ULTRA 2) w/Device KIT Use to obtain blood sugar daily. Dx Code E11.40 1 kit 0   clopidogrel (PLAVIX) 75 MG tablet Take 1 tablet (75 mg total) by mouth daily. 90 tablet 3   Ferrous Sulfate 28 MG TABS Take 28 mg by mouth 2 (two) times a week.     finasteride (PROSCAR) 5 MG tablet Take 5 mg by mouth daily.     fluticasone (CUTIVATE) 0.05 % cream Apply 1 application. topically daily as needed (irritation).     glucose blood (ONE TOUCH ULTRA TEST) test strip USE TO CHECK BLOOD SUGAR ONCE A DAY Dx Code E11.40 100 each 3   ketoconazole (NIZORAL) 2 % shampoo Apply 1 application. topically 2 (two) times a week.     losartan (COZAAR) 25 MG tablet Take 25 mg by mouth daily.     Multiple Vitamins-Minerals (ICAPS AREDS 2 PO) Take 1 capsule by mouth daily.     nitroGLYCERIN (NITROSTAT) 0.4 MG SL tablet DISSOLVE 1 TABLET UNDER TONGUE AS NEEDEDFOR CHEST PAIN. MAY REPEAT 5 MINUTES APART 3 TIMES IF NEEDED 25  tablet 0   ondansetron (ZOFRAN) 4 MG tablet Take 1 tablet (4 mg total) by mouth every 8 (eight) hours. 90 tablet 1   OneTouch Delica Lancets 65K  MISC 1 each by In Vitro route daily. Dx Code E11.49 100 each 3   OXYGEN Inhale 2 L into the lungs at bedtime. May use in the day time if he gets "winded"     pantoprazole (PROTONIX) 40 MG tablet TAKE 1 TABLET BY MOUTH TWICE (2) DAILY 180 tablet 3   polyethylene glycol (MIRALAX / GLYCOLAX) 17 g packet Take 17 g by mouth every other day. (Patient taking differently: Take 17 g by mouth daily.) 1 each 0   tamsulosin (FLOMAX) 0.4 MG CAPS capsule Take 0.4 mg by mouth at bedtime.     HYDROcodone-acetaminophen (NORCO) 10-325 MG tablet Take 1 tablet by mouth every 6 (six) hours as needed for severe pain. Must last 30 days. 120 tablet 0   No facility-administered medications prior to visit.   Review of Systems  Review of Systems  Constitutional: Negative.   HENT:  Positive for congestion.   Respiratory:  Positive for shortness of breath. Negative for cough, chest tightness and wheezing.     Physical Exam  BP (!) 144/78 (BP Location: Left Arm, Patient Position: Sitting, Cuff Size: Normal)   Pulse 97   Temp 97.6 F (36.4 C) (Oral)   Ht 6' (1.829 m)   Wt 185 lb 9.6 oz (84.2 kg)   SpO2 97%   BMI 25.17 kg/m  Physical Exam Constitutional:      Appearance: Normal appearance.  HENT:     Head: Normocephalic and atraumatic.  Cardiovascular:     Rate and Rhythm: Normal rate and regular rhythm.  Pulmonary:     Effort: Pulmonary effort is normal.     Breath sounds: Rales present.  Skin:    General: Skin is warm and dry.  Neurological:     General: No focal deficit present.     Mental Status: He is alert and oriented to person, place, and time. Mental status is at baseline.  Psychiatric:        Mood and Affect: Mood normal.        Behavior: Behavior normal.        Thought Content: Thought content normal.        Judgment: Judgment normal.       Lab Results:  CBC    Component Value Date/Time   WBC 9.4 07/14/2021 1111   RBC 5.02 07/14/2021 1111   HGB 13.3 07/14/2021 1111   HGB 8.8 (L) 04/08/2017 0952   HCT 42.5 07/14/2021 1111   HCT 28.5 (L) 04/08/2017 0952   PLT 187 07/14/2021 1111   PLT 329 04/08/2017 0952   MCV 84.7 07/14/2021 1111   MCV 70 (L) 04/08/2017 0952   MCH 26.5 07/14/2021 1111   MCHC 31.3 07/14/2021 1111   RDW 15.1 07/14/2021 1111   RDW 17.0 (H) 04/08/2017 0952   LYMPHSABS 1.7 04/07/2021 1212   LYMPHSABS 1.7 05/11/2016 1233   MONOABS 0.7 04/07/2021 1212   EOSABS 0.1 04/07/2021 1212   EOSABS 0.4 05/11/2016 1233   BASOSABS 0.0 04/07/2021 1212   BASOSABS 0.1 05/11/2016 1233    BMET    Component Value Date/Time   NA 139 07/14/2021 1111   NA 143 03/10/2021 0000   K 4.4 07/14/2021 1111   CL 100 07/14/2021 1111   CO2 31 07/14/2021 1111   GLUCOSE 326 (H) 07/14/2021 1111   BUN 37 (H) 07/14/2021 1111   BUN 51 (A) 03/10/2021 0000   CREATININE 1.89 (H) 07/14/2021 1111   CREATININE 2.28 (H) 06/24/2017 1136   CALCIUM 9.9 07/14/2021 1111  GFRNONAA 34 (L) 07/14/2021 1111   GFRNONAA 26 (L) 06/24/2017 1136   GFRAA 30 (L) 06/24/2017 1136    BNP    Component Value Date/Time   BNP 76.9 04/13/2020 0614    ProBNP    Component Value Date/Time   PROBNP 626 (H) 04/08/2017 0952   PROBNP 37.0 01/22/2014 1143    Imaging: No results found.   Assessment & Plan:   ILD (interstitial lung disease) ([place]) - Long standing hx shortness of breath. Dyspnea cause not identified, likely multifactorial d/t COPD, ILD, deconditioning and possible neuromuscular disease. Symptoms remain unchanged. Most recent CT chest in Nov 2022 showed no evidence of ILD, however, there was significant bronchiectasis. He was referred to neurology during last visit d.t dyspnea/dysphagia symptoms, concern for underlying neuromuscular disorder with elevated acetylcholine receptor antibody. He saw Dr. Posey Pronto with Neurology, symptoms were not  felt to be classic presentation for myasthenia. Antibody felt to be possibly more of a systemic inflammatory response d.t hx RA. It was recommended that he have NCS/EMG testing but patient declined. Neurology recommended empiric trial mestinon 40m 1 hour prior to meals and follow up in 2 weeks. Advised he follow back with neurology. We will trial him on Stiolto as he has had some moderate obstructive airway disease on pulmonary function testing and he's a former smoker. Continue oxygen at bedtime. Recommend he take mucinex 1 to 2 tablets twice daily as needed to loosen congestion. Needs follow-up spirometry/DLCO at follow-up. Encourage patient stay active and work on conditioning. FU in 3 months with Dr. RChase Caller    EMartyn Ehrich NP 12/13/2021

## 2021-12-09 NOTE — Patient Instructions (Addendum)
Dyspnea is multifactorial due to COPD, coronary artery disease, deconditioning  Recommendations - Trial Stiolto- take 2 puffs daily in the morning x2 weeks (if you notice an benefit in breathing please notify our office and we can send a prescription) -Take Mucinex 1 to 2 tablets twice daily as needed to loosen congestion  - Recommend that you return to neurology for a follow-up appointment due to possible neuromuscular disease causing shortness of breath and dyspnea (Dr. Posey Pronto with [place] [facility] - [phone])  - Continue to stay active as possible with daily walks - Continue to wear oxygen at bedtime  Orders - Spirometry with DLCO  Follow-up - 3 months with Dr. Chase Caller with PFTs 30 min piror

## 2021-12-11 ENCOUNTER — Ambulatory Visit (INDEPENDENT_AMBULATORY_CARE_PROVIDER_SITE_OTHER): Payer: Medicare Other | Admitting: Internal Medicine

## 2021-12-11 ENCOUNTER — Encounter: Payer: Self-pay | Admitting: Internal Medicine

## 2021-12-11 VITALS — BP 130/84 | HR 90 | Temp 97.8°F | Ht 72.0 in | Wt 185.0 lb

## 2021-12-11 DIAGNOSIS — G8929 Other chronic pain: Secondary | ICD-10-CM | POA: Diagnosis not present

## 2021-12-11 DIAGNOSIS — Z23 Encounter for immunization: Secondary | ICD-10-CM | POA: Diagnosis not present

## 2021-12-11 DIAGNOSIS — M79604 Pain in right leg: Secondary | ICD-10-CM | POA: Diagnosis not present

## 2021-12-11 DIAGNOSIS — E1142 Type 2 diabetes mellitus with diabetic polyneuropathy: Secondary | ICD-10-CM | POA: Diagnosis not present

## 2021-12-11 DIAGNOSIS — G894 Chronic pain syndrome: Secondary | ICD-10-CM | POA: Diagnosis not present

## 2021-12-11 DIAGNOSIS — M79605 Pain in left leg: Secondary | ICD-10-CM | POA: Diagnosis not present

## 2021-12-11 DIAGNOSIS — M47816 Spondylosis without myelopathy or radiculopathy, lumbar region: Secondary | ICD-10-CM | POA: Diagnosis not present

## 2021-12-11 DIAGNOSIS — M792 Neuralgia and neuritis, unspecified: Secondary | ICD-10-CM | POA: Diagnosis not present

## 2021-12-11 DIAGNOSIS — R300 Dysuria: Secondary | ICD-10-CM

## 2021-12-11 MED ORDER — HYDROCODONE-ACETAMINOPHEN 10-325 MG PO TABS: 1.0000 | ORAL_TABLET | Freq: Two times a day (BID) | ORAL | 0 refills | Status: AC | PRN

## 2021-12-11 MED ORDER — PHENAZOPYRIDINE HCL 100 MG PO TABS: 100.0000 mg | ORAL_TABLET | Freq: Three times a day (TID) | ORAL | 1 refills | Status: DC | PRN

## 2021-12-11 NOTE — Progress Notes (Signed)
Subjective:    Patient ID: [name], male    DOB: 01-Apr-1932, 88 y.o.   MRN: [id number]  HPI Here with daughter and wife for follow up of multiple issues  Still feels "terrible" Goes back 6-12 months Ongoing neuropathy pain  Done with pain specialist Only using the norco 10/325 --just bid Off the lyrica--neuropathy does seem worse Less sleepy now of it  Still with urinary frequency and dysuria Tried pyridium---but stopped It may have helped some  Current Outpatient Medications on File Prior to Visit  Medication Sig Dispense Refill   amLODipine (NORVASC) 5 MG tablet Take 5 mg by mouth at bedtime.     aspirin EC 81 MG tablet Take 81 mg by mouth at bedtime.     Blood Glucose Monitoring Suppl (ONE TOUCH ULTRA 2) w/Device KIT Use to obtain blood sugar daily. Dx Code E11.40 1 kit 0   clopidogrel (PLAVIX) 75 MG tablet Take 1 tablet (75 mg total) by mouth daily. 90 tablet 3   Ferrous Sulfate 28 MG TABS Take 28 mg by mouth 2 (two) times a week.     finasteride (PROSCAR) 5 MG tablet Take 5 mg by mouth daily.     fluticasone (CUTIVATE) 0.05 % cream Apply 1 application. topically daily as needed (irritation).     glucose blood (ONE TOUCH ULTRA TEST) test strip USE TO CHECK BLOOD SUGAR ONCE A DAY Dx Code E11.40 100 each 3   HYDROcodone-acetaminophen (NORCO) 10-325 MG tablet Take 1 tablet by mouth every 6 (six) hours as needed for severe pain. Must last 30 days. 120 tablet 0   ketoconazole (NIZORAL) 2 % shampoo Apply 1 application. topically 2 (two) times a week.     losartan (COZAAR) 25 MG tablet Take 25 mg by mouth daily.     Multiple Vitamins-Minerals (ICAPS AREDS 2 PO) Take 1 capsule by mouth daily.     nitroGLYCERIN (NITROSTAT) 0.4 MG SL tablet DISSOLVE 1 TABLET UNDER TONGUE AS NEEDEDFOR CHEST PAIN. MAY REPEAT 5 MINUTES APART 3 TIMES IF NEEDED 25 tablet 0   ondansetron (ZOFRAN) 4 MG tablet Take 1 tablet (4 mg total) by mouth every 8 (eight) hours. 90 tablet 1   OneTouch Delica  Lancets 92T MISC 1 each by In Vitro route daily. Dx Code E11.49 100 each 3   OXYGEN Inhale 2 L into the lungs at bedtime. May use in the day time if he gets "winded"     pantoprazole (PROTONIX) 40 MG tablet TAKE 1 TABLET BY MOUTH TWICE (2) DAILY 180 tablet 3   polyethylene glycol (MIRALAX / GLYCOLAX) 17 g packet Take 17 g by mouth every other day. (Patient taking differently: Take 17 g by mouth daily.) 1 each 0   tamsulosin (FLOMAX) 0.4 MG CAPS capsule Take 0.4 mg by mouth at bedtime.     Tiotropium Bromide-Olodaterol (STIOLTO RESPIMAT) 2.5-2.5 MCG/ACT AERS Inhale 2 puffs into the lungs daily. 4 g 0   No current facility-administered medications on file prior to visit.    Allergies  Allergen Reactions   Metronidazole     Kidney Dr does not want pt to take    Doxazosin Mesylate Other (See Comments)    dizziness   Methocarbamol Rash    Past Medical History:  Diagnosis Date   Aortic stenosis    Arthritis    osteoarthritis, s/p R TKR, and digits   CAD (coronary artery disease)    a. s/p CABG (2001)  b. s/p DES to RCA and cutting  POBA to ostial PDA (2013)   c. s/p DES to SVG to OM2 (01/14/14) d. cath: 08/2015 NSTEMI w/ patent LIMA-LAD and 99% stenosis of SVG-OM w/ DES placed. CTO of SVG-RCA and SVG-D1.    Cancer [facility])    bladder  and skin   Cataract    Chronic diastolic CHF (congestive heart failure) ([place])    a) 09/13 ECHO- LVEF 54-65%, grade 1 diastolic dysfunction, mild LA dilatation, atrial septal aneurysm, AV mobility restricted, but no sig AS by doppler; b) 09/04/08 ECHO- LVH, ef 60%, mild AS, c. echo 08/2015: EF perserved of 55-60% with inferolateral HK. Mild AS noted.   Chronic kidney disease, stage III (moderate) ([name])    Chronic lower back pain    Colon polyps    COVID-19    Diverticulosis    Dyspnea 2009 since July -Sept   05/06/08-CPST-  normal effort, reduced VO2 max 20.5 /65%, reduced at 8.2/ 40%, normal breathing resetvca of 55%, submaximal heart rate response 112/77%,  flattened o2 pluse response at peak exercise-12 ml/beat @ 85%, No VQ mismatch abnormalities, All c/w CIRC Limitation   Enlarged prostate    Esophageal stricture    a. s/p dilation spring 2010   GERD (gastroesophageal reflux disease)    Heart murmur    Hiatal hernia    History of carpal tunnel syndrome    Bilateral   History of kidney stones    History of PFTs    mixed pattern on spiro. mild restn on lung volumes with near normal DLCO. Pattern can be explained by CABG scar. Fev1 2.2L/73%, ratio 68 (67), TLC 4.7/68%,RV 1.5L/55%,DLCO 79%   Hyperlipidemia    Hypertension    Interstitial lung disease ([name])    NOS   Iron deficiency anemia    Myocardial infarction ([name])    Nausea & vomiting    2018/2019   On home oxygen therapy    2 L [place] at bedtime   Osteoporosis    Overweight (BMI 25.0-29.9)    BMI 29   Peripheral neuropathy    RA (rheumatoid arthritis) ([place])    [name]   Seropositive rheumatoid arthritis ([place])    Type II diabetes mellitus ([place])    diet controlled   Wears glasses    Wears partial dentures    upper    Past Surgical History:  Procedure Laterality Date   BALLOON DILATION N/A 09/12/2018   Procedure: BALLOON DILATION;  Surgeon: Irene Shipper, MD;  Location: [facility];  Service: Endoscopy;  Laterality: N/A;   BALLOON DILATION N/A 02/20/2020   Procedure: BALLOON DILATION;  Surgeon: Irene Shipper, MD;  [facility]: [facility];  Service: Endoscopy;  Laterality: N/A;   BALLOON DILATION N/A 08/24/2021   Procedure: BALLOON DILATION;  Surgeon: Irene Shipper, MD;  Location: [facility];  Service: Gastroenterology;  Laterality: N/A;   BOTOX INJECTION N/A 09/12/2018   Procedure: BOTOX INJECTION;  Surgeon: Irene Shipper, MD;  [facility];  Service: Endoscopy;  Laterality: N/A;   BOTOX INJECTION N/A 11/27/2018   Procedure: BOTOX INJECTION;  Surgeon: Irene Shipper, MD;  [facility];  Service: Endoscopy;  Laterality: N/A;   BOTOX INJECTION N/A 02/20/2020    Procedure: BOTOX INJECTION;  Surgeon: Irene Shipper, MD;  [facility];  Service: Endoscopy;  Laterality: N/A;   BOTOX INJECTION  08/24/2021   Procedure: BOTOX INJECTION;  Surgeon: Irene Shipper, MD;  [facility];  Service: Gastroenterology;;   CARDIAC CATHETERIZATION  08/2004   CP-  no MI, Cath- small vessell disease    CARDIAC CATHETERIZATION  12/31/2011   80% distal LM, 100% native LAD, LCx and RCA, 30% prox SVG-OM, SVG-D1 normal, 99% distal, 80% ostial SVG-RCA distal to graft, LIMA-LAD normal; LVEF mildly decreased with posterior basal AK    CARDIAC CATHETERIZATION  2009   with patent grafts/notes 12/31/2011   CARDIAC CATHETERIZATION N/A 08/13/2015   Procedure: Left Heart Cath and Cors/Grafts Angiography;  Surgeon: Sherren Mocha, MD;  Location: [place] [facility];  Service: Cardiovascular;  Laterality: N/A;   CATARACT EXTRACTION W/ INTRAOCULAR LENS  IMPLANT, BILATERAL Bilateral    CHOLECYSTECTOMY OPEN  11/2003   [name]   CORONARY ANGIOPLASTY WITH STENT PLACEMENT  01/03/2012   Successful DES to SVG-RCA and cutting balloon angioplasty ostial  PDA    CORONARY ANGIOPLASTY WITH STENT PLACEMENT  01/14/2014   "1"   CORONARY ARTERY BYPASS GRAFT  11/1999   CABG X5   CORONARY STENT PLACEMENT  02/2012   1 stent and balloon   CYSTOSCOPY WITH BIOPSY N/A 03/17/2021   Procedure: DIAGNOSTIC CYSTOSCOPY WITH BIOPSY;  Surgeon: Robley Fries, MD;  [facility];  Service: Urology;  Laterality: N/A;  1 HR   CYSTOSCOPY WITH RETROGRADE PYELOGRAM, URETEROSCOPY AND STENT PLACEMENT Right 07/28/2021   Procedure: CYSTOSCOPY WITH FULGERATION, STENT PLACEMENT, RETROGRADE PYELOGRAM,  DIAGNOSTIC URETEROSCOPY;  Surgeon: Robley Fries, MD;  Location: [facility];  Service: Urology;  Laterality: Right;   ESOPHAGEAL DILATION  11/27/2018   Procedure: ESOPHAGEAL DILATION;  Surgeon: Irene Shipper, MD;  [facility];  Service: Endoscopy;;   ESOPHAGOGASTRODUODENOSCOPY N/A 03/01/2017    Procedure: ESOPHAGOGASTRODUODENOSCOPY (EGD);  Surgeon: Irene Shipper, MD;  [facility];  Service: Endoscopy;  Laterality: N/A;   ESOPHAGOGASTRODUODENOSCOPY (EGD) WITH ESOPHAGEAL DILATION  2010   ESOPHAGOGASTRODUODENOSCOPY (EGD) WITH PROPOFOL N/A 09/12/2018   Procedure: ESOPHAGOGASTRODUODENOSCOPY (EGD) WITH PROPOFOL;  Surgeon: Irene Shipper, MD;  [facility];  Service: Endoscopy;  Laterality: N/A;   ESOPHAGOGASTRODUODENOSCOPY (EGD) WITH PROPOFOL N/A 11/27/2018   Procedure: ESOPHAGOGASTRODUODENOSCOPY (EGD) WITH PROPOFOL, WITH BALLOON DILATION;  Surgeon: Irene Shipper, MD;  [facility];  Service: Endoscopy;  Laterality: N/A;   ESOPHAGOGASTRODUODENOSCOPY (EGD) WITH PROPOFOL N/A 02/20/2020   Procedure: ESOPHAGOGASTRODUODENOSCOPY (EGD) WITH PROPOFOL;  Surgeon: Irene Shipper, MD;  [facility];  Service: Endoscopy;  Laterality: N/A;   ESOPHAGOGASTRODUODENOSCOPY (EGD) WITH PROPOFOL N/A 08/24/2021   Procedure: ESOPHAGOGASTRODUODENOSCOPY (EGD) WITH PROPOFOL;  Surgeon: Irene Shipper, MD;  [facility];  Service: Gastroenterology;  Laterality: N/A;   EXTRACORPOREAL SHOCK WAVE LITHOTRIPSY Right 05/11/2021   Procedure: EXTRACORPOREAL SHOCK WAVE LITHOTRIPSY (ESWL);  Surgeon: [name], MD;  Location: [facility];  Service: Urology;  Laterality: Right;   HAND SURGERY     bilateral carpal tunnel releases   JOINT REPLACEMENT     KNEE ARTHROSCOPY Right 2008   LEFT AND RIGHT HEART CATHETERIZATION WITH CORONARY ANGIOGRAM  12/31/2011   Procedure: LEFT AND RIGHT HEART CATHETERIZATION WITH CORONARY ANGIOGRAM;  Surgeon: Burnell Blanks, MD;  Location: [facility] CATH LAB;  Service: Cardiovascular;;   LEFT AND RIGHT HEART CATHETERIZATION WITH CORONARY ANGIOGRAM N/A 01/14/2014   Procedure: LEFT AND RIGHT HEART CATHETERIZATION WITH CORONARY ANGIOGRAM;  Surgeon: Peter M Martinique, MD;  Location: [facility] CATH LAB;  Service: Cardiovascular;  Laterality: N/A;    MALONEY DILATION  03/01/2017   Procedure: Venia Minks DILATION;  Surgeon: Irene Shipper, MD;  [facility];  Service: Endoscopy;;   PERCUTANEOUS CORONARY INTERVENTION-BALLOON ONLY  01/03/2012   Procedure: PERCUTANEOUS CORONARY INTERVENTION-BALLOON ONLY;  Surgeon: Peter M Martinique, MD;  Location: [facility] CATH LAB;  Service: Cardiovascular;;   PERCUTANEOUS CORONARY STENT INTERVENTION (PCI-S)  12/31/2011   Procedure: PERCUTANEOUS CORONARY STENT INTERVENTION (PCI-S);  Surgeon: Burnell Blanks, MD;  Location[facility];  Service: Cardiovascular;;   PERCUTANEOUS CORONARY STENT INTERVENTION (PCI-S) N/A 01/03/2012   Procedure: PERCUTANEOUS CORONARY STENT INTERVENTION (PCI-S);  Surgeon: Peter M Martinique, MD;  Location: [facility] CATH LAB;  Service: Cardiovascular;  Laterality: N/A;   SHOULDER ARTHROSCOPY WITH OPEN ROTATOR CUFF REPAIR AND DISTAL CLAVICLE ACROMINECTOMY Left 02/27/2013   Procedure: LEFT SHOULDER ARTHROSCOPY WITH MINI OPEN ROTATOR CUFF REPAIR AND SUBACROMIAL DECOMPRESSION AND DISTAL CLAVICLE RESECTION;  Surgeon: [name], MD;  Location: [place];  Service: Orthopedics;  Laterality: Left;   TOTAL KNEE ARTHROPLASTY Right 03/2010   [name]   TRANSURETHRAL RESECTION OF BLADDER TUMOR N/A 03/17/2021   Procedure: TRANSURETHRAL RESECTION OF BLADDER TUMOR (TURBT);  Surgeon: Robley Fries, MD;  [facility];  Service: Urology;  Laterality: N/A;   TRANSURETHRAL RESECTION OF BLADDER TUMOR WITH MITOMYCIN-C N/A 10/08/2020   Procedure: TRANSURETHRAL RESECTION OF BLADDER TUMOR WITH GEMCITABINE;  Surgeon: Robley Fries, MD;  Location: [facility];  Service: Urology;  Laterality: N/A;  75 MINS   TRIGGER FINGER RELEASE Left 02/27/2013   Procedure: RELEASE TRIGGER FINGER/A-1 PULLEY;  Surgeon: [name], MD;  Location: [place];  Service: Orthopedics;  Laterality: Left;    Family History  Problem Relation Age of Onset   COPD Mother    Heart disease Father    Heart attack Father    Stomach  cancer Brother    Stroke Sister    Alcohol abuse Sister    Colon cancer Brother 38   Diabetes Brother    Rectal cancer Neg Hx     Social History   Socioeconomic History   Marital status: Married    Spouse name: Not on file   Number of children: 3   Years of education: Not on file   Highest education level: Not on file  Occupational History   Occupation: Designer, jewellery: RETIRED    Comment: retired  Tobacco Use   Smoking status: Former    Packs/day: 1.00    Years: 20.00    Total pack years: 20.00    Types: Cigarettes    Quit date: 04/06/1963    Years since quitting: 58.7    Passive exposure: Current   Smokeless tobacco: Former  Scientific laboratory technician Use: Never used  Substance and Sexual Activity   Alcohol use: No    Alcohol/week: 0.0 standard drinks of alcohol    Comment: 01/01/2012 "last alcohol ~ 50 yr ago"   Drug use: No   Sexual activity: Not Currently  Other Topics Concern   Not on file  Social History Narrative   No living will   Requests wife as health care POA-- alternate is daughter Hassan Rowan   Discussed DNR --he requests this (done 08/29/12)   Not sure about feeding tube---but might accept for some time   Patient lives with wife and daughter in a one story home.  Has 3 children.  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      Right Handed    Social Determinants of Health   Financial Resource Strain: Low Risk  (07/25/2020)   Overall Financial Resource Strain (CARDIA)    Difficulty of Paying Living Expenses: Not very  hard  Food Insecurity: Not on file  Transportation Needs: Not on file  Physical Activity: Not on file  Stress: Not on file  Social Connections: Not on file  Intimate Partner Violence: Not on file   Review of Systems Trouble sleeping Appetite is better lately    Objective:   Physical Exam Constitutional:      Appearance: Normal appearance.  Neurological:     Mental Status: He is alert.  Psychiatric:     Comments:  Still frustrated and some depression over symptoms            Assessment & Plan:

## 2021-12-11 NOTE — Assessment & Plan Note (Signed)
Has element of irritable bladder Could consider nortriptyline but I am concerned about more side effects Will try pyridium

## 2021-12-11 NOTE — Assessment & Plan Note (Signed)
This is the major factor for his feeling so bad Will restart the lyrica at 25-'50mg'$ --but only at bedtime Agreed with daughter trying the capsaicin for his feet  Will continue the norco 10/325 bid for now---and wean the dose if his symptoms are finally better

## 2021-12-11 NOTE — Addendum Note (Signed)
Addended by: Pilar Grammes on: 12/11/2021 12:51 PM   Modules accepted: Orders

## 2021-12-11 NOTE — Patient Instructions (Signed)
Please try over the counter capsaicin cream for your feet. It takes a while to be effective. I have prescribed pyridium for your bladder---you can take it up to three times a day. Restart the lyrica at '25mg'$  at bedtime--and then you can increase this to '50mg'$  if it helps.

## 2021-12-13 NOTE — Assessment & Plan Note (Addendum)
-   Long standing hx shortness of breath. Dyspnea cause not identified, likely multifactorial d/t COPD, ILD, deconditioning and possible neuromuscular disease. Symptoms remain unchanged. Most recent CT chest in Nov 2022 showed no evidence of ILD, however, there was significant bronchiectasis. He was referred to neurology during last visit d.t dyspnea/dysphagia symptoms, concern for underlying neuromuscular disorder with elevated acetylcholine receptor antibody. He saw Dr. Posey Pronto with Neurology, symptoms were not felt to be classic presentation for myasthenia. Antibody felt to be possibly more of a systemic inflammatory response d.t hx RA. It was recommended that he have NCS/EMG testing but patient declined. Neurology recommended empiric trial mestinon '60mg'$  1 hour prior to meals and follow up in 2 weeks. Advised he follow back with neurology. We will trial him on Stiolto as he has had some moderate obstructive airway disease on pulmonary function testing and he's a former smoker. Continue oxygen at bedtime. Recommend he take mucinex 1 to 2 tablets twice daily as needed to loosen congestion. Needs follow-up spirometry/DLCO at follow-up. Encourage patient stay active and work on conditioning. FU in 3 months with Dr. Chase Caller.

## 2021-12-14 ENCOUNTER — Telehealth: Payer: Self-pay | Admitting: Internal Medicine

## 2021-12-14 DIAGNOSIS — N184 Chronic kidney disease, stage 4 (severe): Secondary | ICD-10-CM | POA: Diagnosis not present

## 2021-12-14 NOTE — Telephone Encounter (Signed)
Spoke to daughter. I do not have the form. Did not see it in his media file. I called the pt and asked him to have them fax it attn to me so I know I got it.

## 2021-12-14 NOTE — Telephone Encounter (Signed)
Pt daughter Darlen call in requesting a call be regarding status of paperwork # [phone]

## 2021-12-15 ENCOUNTER — Encounter: Payer: Medicare Other | Admitting: Student in an Organized Health Care Education/Training Program

## 2021-12-16 ENCOUNTER — Encounter: Payer: Self-pay | Admitting: Physician Assistant

## 2021-12-16 ENCOUNTER — Ambulatory Visit: Payer: Medicare Other | Attending: Physician Assistant | Admitting: Physician Assistant

## 2021-12-16 VITALS — BP 126/70 | HR 87 | Ht 72.0 in | Wt 179.6 lb

## 2021-12-16 DIAGNOSIS — I5032 Chronic diastolic (congestive) heart failure: Secondary | ICD-10-CM | POA: Diagnosis not present

## 2021-12-16 DIAGNOSIS — J8410 Pulmonary fibrosis, unspecified: Secondary | ICD-10-CM | POA: Diagnosis not present

## 2021-12-16 DIAGNOSIS — N184 Chronic kidney disease, stage 4 (severe): Secondary | ICD-10-CM

## 2021-12-16 DIAGNOSIS — I10 Essential (primary) hypertension: Secondary | ICD-10-CM

## 2021-12-16 DIAGNOSIS — I350 Nonrheumatic aortic (valve) stenosis: Secondary | ICD-10-CM

## 2021-12-16 DIAGNOSIS — E7800 Pure hypercholesterolemia, unspecified: Secondary | ICD-10-CM | POA: Diagnosis not present

## 2021-12-16 DIAGNOSIS — I2510 Atherosclerotic heart disease of native coronary artery without angina pectoris: Secondary | ICD-10-CM

## 2021-12-16 DIAGNOSIS — R0689 Other abnormalities of breathing: Secondary | ICD-10-CM | POA: Diagnosis not present

## 2021-12-16 NOTE — Progress Notes (Signed)
Cardiology Office Note:    Date:  12/16/2021   ID:  [name], DOB Jul 29, 1931, MRN [id number]  PCP:  [name] Carbon, MD  [facility] Providers Cardiologist:  [name] Rouge, MD     Referring MD: [name] Carbon, MD   Chief Complaint:  F/u for CAD, AS    Patient Profile: Coronary artery disease  S/p CABG S/p DES to [facility] and cutting POBA to [place] in 12/2011 S/p DES to S-OM2 in 01/2014 S/p DES to S-OM1/OM2 due to ISR in 08/2015 Aortic stenosis Echo 10/22: EF 60-65, mean AV gradient 14 mmHg (mild-moderate AS) (HFpEF) heart failure with preserved ejection fraction  ILD (Interstitial Lung Disease)  Chronic kidney disease  [name]. Merry [name] Rheumatoid arthritis  Hypertension  Hyperlipidemia  Diabetes mellitus  Aortic atherosclerosis Dysphagia Undergoes regular Botox injections with GI Bladder CA S/p TURBT in 10/2020; BCG Rx  Prior CV Studies: Echocardiogram 01/09/2021 EF 60-65, no RWMA, GR 1 DD, normal RVSF, mild-moderate aortic stenosis (mean gradient 14 mmHg, V-max 247 cm/s, DI 0.32)   Echocardiogram 04/11/2020 EF 55-60, GR 1 DD, RVSP 27.4, mild aortic stenosis (mean 14 mmHg, V-max 235.5 cm/s, DI 0.32)   Event monitor 04/12/2017 Sinus rhythm, isolated PACs, PVCs; no significant arrhythmia   Cardiac Catheterization 08/13/15 LM ost 80 LAD prox 100 OM2 90 RCA prox 100 L-LAD ok S-OM1/OM2 prox 99 ISR S-RCA 100 (CTO) S-D1 prox 100 (CTO) PCI:  3.5 x 12 mm Resolute DES to S-OM1/OM2   Nuclear stress test 01/08/2014 Severe ischemia in mid LAD territory, large inferior scar, EF 45; high risk  History of Present Illness:   [name] is a 88 y.o. male with the above problem list.  He was last seen in Nov 22. He returns for f/u. He is here with his daughter. He remains weak and deconditioned. His breathing is overall stable. He has not had chest pain. He has not had syncope, orthopnea, leg edema.        Past Medical History:  Diagnosis Date   Aortic stenosis     Arthritis    osteoarthritis, s/p R TKR, and digits   CAD (coronary artery disease)    a. s/p CABG (2001)  b. s/p DES to RCA and cutting POBA to ostial PDA (2013)   c. s/p DES to SVG to OM2 (01/14/14) d. cath: 08/2015 NSTEMI w/ patent LIMA-LAD and 99% stenosis of SVG-OM w/ DES placed. CTO of SVG-RCA and SVG-D1.    Cancer [facility])    bladder  and skin   Cataract    Chronic diastolic CHF (congestive heart failure) ([place])    a) 09/13 ECHO- LVEF 78-93%, grade 1 diastolic dysfunction, mild LA dilatation, atrial septal aneurysm, AV mobility restricted, but no sig AS by doppler; b) 09/04/08 ECHO- LVH, ef 60%, mild AS, c. echo 08/2015: EF perserved of 55-60% with inferolateral HK. Mild AS noted.   Chronic kidney disease, stage III (moderate) ([name])    Chronic lower back pain    Colon polyps    COVID-19    Diverticulosis    Dyspnea 2009 since July -Sept   05/06/08-CPST-  normal effort, reduced VO2 max 20.5 /65%, reduced at 8.2/ 40%, normal breathing resetvca of 55%, submaximal heart rate response 112/77%, flattened o2 pluse response at peak exercise-12 ml/beat @ 85%, No VQ mismatch abnormalities, All c/w CIRC Limitation   Enlarged prostate    Esophageal stricture    a. s/p dilation spring 2010   GERD (gastroesophageal reflux disease)  Heart murmur    Hiatal hernia    History of carpal tunnel syndrome    Bilateral   History of kidney stones    History of PFTs    mixed pattern on spiro. mild restn on lung volumes with near normal DLCO. Pattern can be explained by CABG scar. Fev1 2.2L/73%, ratio 68 (67), TLC 4.7/68%,RV 1.5L/55%,DLCO 79%   Hyperlipidemia    Hypertension    Interstitial lung disease ([name])    NOS   Iron deficiency anemia    Myocardial infarction ([name])    Nausea & vomiting    2018/2019   On home oxygen therapy    2 L [place] at bedtime   Osteoporosis    Overweight (BMI 25.0-29.9)    BMI 29   Peripheral neuropathy    RA (rheumatoid arthritis) ([place])    [name]   Seropositive  rheumatoid arthritis ([place])    Type II diabetes mellitus ([place])    diet controlled   Wears glasses    Wears partial dentures    upper   Current Medications: Current Meds  Medication Sig   amLODipine (NORVASC) 5 MG tablet Take 5 mg by mouth at bedtime.   aspirin EC 81 MG tablet Take 81 mg by mouth at bedtime.   Blood Glucose Monitoring Suppl (ONE TOUCH ULTRA 2) w/Device KIT Use to obtain blood sugar daily. Dx Code E11.40   clopidogrel (PLAVIX) 75 MG tablet Take 1 tablet (75 mg total) by mouth daily.   Ferrous Sulfate 28 MG TABS Take 28 mg by mouth 2 (two) times a week.   finasteride (PROSCAR) 5 MG tablet Take 5 mg by mouth daily.   fluticasone (CUTIVATE) 0.05 % cream Apply 1 application. topically daily as needed (irritation).   glucose blood (ONE TOUCH ULTRA TEST) test strip USE TO CHECK BLOOD SUGAR ONCE A DAY Dx Code E11.40   HYDROcodone-acetaminophen (NORCO) 10-325 MG tablet Take 1 tablet by mouth 2 (two) times daily as needed for severe pain. Must last 30 days.   ketoconazole (NIZORAL) 2 % shampoo Apply 1 application. topically 2 (two) times a week.   losartan (COZAAR) 25 MG tablet Take 25 mg by mouth daily.   Multiple Vitamins-Minerals (ICAPS AREDS 2 PO) Take 1 capsule by mouth daily.   nitroGLYCERIN (NITROSTAT) 0.4 MG SL tablet DISSOLVE 1 TABLET UNDER TONGUE AS NEEDEDFOR CHEST PAIN. MAY REPEAT 5 MINUTES APART 3 TIMES IF NEEDED   ondansetron (ZOFRAN) 4 MG tablet Take 1 tablet (4 mg total) by mouth every 8 (eight) hours.   OneTouch Delica Lancets 54O MISC 1 each by In Vitro route daily. Dx Code E11.49   OXYGEN Inhale 2 L into the lungs at bedtime. May use in the day time if he gets "winded"   pantoprazole (PROTONIX) 40 MG tablet TAKE 1 TABLET BY MOUTH TWICE (2) DAILY   polyethylene glycol (MIRALAX / GLYCOLAX) 17 g packet Take 17 g by mouth every other day.   pregabalin (LYRICA) 25 MG capsule Take 25-50 mg by mouth at bedtime.   tamsulosin (FLOMAX) 0.4 MG CAPS capsule Take 0.4 mg by  mouth at bedtime.   Tiotropium Bromide-Olodaterol (STIOLTO RESPIMAT) 2.5-2.5 MCG/ACT AERS Inhale 2 puffs into the lungs daily.    Allergies:   Metronidazole, Doxazosin mesylate, and Methocarbamol   Social History   Tobacco Use   Smoking status: Former    Packs/day: 1.00    Years: 20.00    Total pack years: 20.00    Types: Cigarettes    Quit  date: 04/06/1963    Years since quitting: 58.7    Passive exposure: Current   Smokeless tobacco: Former  Scientific laboratory technician Use: Never used  Substance Use Topics   Alcohol use: No    Alcohol/week: 0.0 standard drinks of alcohol    Comment: 01/01/2012 "last alcohol ~ 89 yr ago"   Drug use: No    Family Hx: The patient's family history includes Alcohol abuse in his sister; COPD in his mother; Colon cancer (age of onset: 59) in his brother; Diabetes in his brother; Heart attack in his father; Heart disease in his father; Stomach cancer in his brother; Stroke in his sister. There is no history of Rectal cancer.  Review of Systems  Constitutional: Negative for fever.  Respiratory:  Negative for sputum production.   Endocrine: Positive for cold intolerance.  Gastrointestinal:  Negative for hematemesis.  Genitourinary:  Negative for hematuria.     EKGs/Labs/Other Test Reviewed:    EKG:  EKG is  ordered today.  The ekg ordered today demonstrates NSR, HR 87, LAD, non-specific ST-TW changes, QTc 445 ms, no significant change from last tracing.   Recent Labs: 04/07/2021: ALT 13 07/14/2021: BUN 37; Creatinine, Ser 1.89; Hemoglobin 13.3; Platelets 187; Potassium 4.4; Sodium 139   Recent Lipid Panel No results for input(s): "CHOL", "TRIG", "HDL", "VLDL", "LDLCALC", "LDLDIRECT" in the last 8760 hours.   Risk Assessment/Calculations/Metrics:              Physical Exam:    VS:  BP 126/70   Pulse 87   Ht 6' (1.829 m)   Wt 179 lb 9.6 oz (81.5 kg)   SpO2 98%   BMI 24.36 kg/m     Wt Readings from Last 3 Encounters:  12/16/21 179 lb 9.6 oz  (81.5 kg)  12/11/21 185 lb (83.9 kg)  12/09/21 185 lb 9.6 oz (84.2 kg)    Constitutional:      Appearance: Healthy appearance. Not in distress.  Neck:     Vascular: JVD normal.  Pulmonary:     Effort: Pulmonary effort is normal.     Breath sounds: No wheezing. Rales (Crackles in R base) present.  Cardiovascular:     Normal rate. Regular rhythm. Normal S1. Normal S2.      Murmurs: There is a grade 2/6 crescendo-decrescendo systolic murmur at the URSB.  Edema:    Peripheral edema absent.  Abdominal:     Palpations: Abdomen is soft.  Skin:    General: Skin is warm and dry.  Neurological:     General: No focal deficit present.     Mental Status: Alert and oriented to person, place and time.         ASSESSMENT & PLAN:   CAD (coronary artery disease) Hx of CABG and multiple PCI procedures since. His last PCI was in 2017 with DES to S-OM1/OM2. He is not having chest pain to suggest angina. He is cold and would like to stop taking Plavix. I think it would be best to keep him on Plavix given his prior interventions. However, I do think it would be reasonable to consider taking him off of ASA. I will review with [name]. Johnsie Cancel. For now, continue ASA 81 mg once daily, Plavix 75 mg once daily. F/u in 6 mos.   Aortic stenosis Mild to mod AS by echocardiogram in 01/2021. Vmax was 247 and DI was 0.32. Will arrange repeat echocardiogram.   (HFpEF) heart failure with preserved ejection fraction ([name]) Volume appears stable.  He is not on diuretic Rx. He has crackles in his R base. Last CT reviewed. He has chronic pleural thickening in the R hemithorax and cylindrical bronchiectasis noted in the mid and lower lung fields. The pt notes that he has been told many times in the past that he has crackles in the R base. He is not having any infectious symptoms or symptoms of volume excess. I suspect his lung exam findings are consistent with his ILD (Interstitial Lung Disease).   Essential hypertension BP  controlled on current meds including Norvasc, Losartan. Continue current Rx.   HLD (hyperlipidemia) He is off statin Rx for unclear reasons. Obtain CMET, Lipids at the time of his Echocardiogram. If LDL > 70, consider adding low dose statin Rx back to his regimen.   CKD (chronic kidney disease) stage 4, GFR 15-29 ml/min ([name]) He is followed by [name]. Milus Height with Nephrology.             Dispo:  Return in about 6 months (around 06/16/2022) for Routine Follow Up w/ [name]. Johnsie Cancel .   Medication Adjustments/Labs and Tests Ordered: Current medicines are reviewed at length with the patient today.  Concerns regarding medicines are outlined above.  Tests Ordered: Orders Placed This Encounter  Procedures   Comp Met (CMET)   Lipid panel   EKG 12-Lead   ECHOCARDIOGRAM COMPLETE   Medication Changes: No orders of the defined types were placed in this encounter.  Signed, Richardson Dopp, PA-C  12/16/2021 4:31 PM    [address], [place], [place]  [id number] Phone: [phone]; Fax: [phone]

## 2021-12-16 NOTE — Assessment & Plan Note (Signed)
Mild to mod AS by echocardiogram in 01/2021. Vmax was 247 and DI was 0.32. Will arrange repeat echocardiogram.

## 2021-12-16 NOTE — Assessment & Plan Note (Signed)
Hx of CABG and multiple PCI procedures since. His last PCI was in 2017 with DES to S-OM1/OM2. He is not having chest pain to suggest angina. He is cold and would like to stop taking Plavix. I think it would be best to keep him on Plavix given his prior interventions. However, I do think it would be reasonable to consider taking him off of ASA. I will review with Dr. Johnsie Cancel. For now, continue ASA 81 mg once daily, Plavix 75 mg once daily. F/u in 6 mos.

## 2021-12-16 NOTE — Assessment & Plan Note (Signed)
Volume appears stable. He is not on diuretic Rx. He has crackles in his R base. Last CT reviewed. He has chronic pleural thickening in the R hemithorax and cylindrical bronchiectasis noted in the mid and lower lung fields. The pt notes that he has been told many times in the past that he has crackles in the R base. He is not having any infectious symptoms or symptoms of volume excess. I suspect his lung exam findings are consistent with his ILD (Interstitial Lung Disease).

## 2021-12-16 NOTE — Assessment & Plan Note (Signed)
He is off statin Rx for unclear reasons. Obtain CMET, Lipids at the time of his Echocardiogram. If LDL > 70, consider adding low dose statin Rx back to his regimen.

## 2021-12-16 NOTE — Assessment & Plan Note (Signed)
BP controlled on current meds including Norvasc, Losartan. Continue current Rx.

## 2021-12-16 NOTE — Assessment & Plan Note (Signed)
He is followed by Dr. Milus Height with Nephrology.

## 2021-12-16 NOTE — Patient Instructions (Signed)
Medication Instructions:  Your physician recommends that you continue on your current medications as directed. Please refer to the Current Medication list given to you today.  *If you need a refill on your cardiac medications before your next appointment, please call your pharmacy*   Lab Work: [place] ECHOCARDIOGRAM:  FASTING LIPID & CMET   If you have labs (blood work) drawn today and your tests are completely normal, you will receive your results only by: [place] (if you have MyChart) OR A paper copy in the mail If you have any lab test that is abnormal or we need to change your treatment, we will call you to review the results.   Testing/Procedures: Your physician has requested that you have an echocardiogram AFTER 01/29/22. Echocardiography is a painless test that uses sound waves to create images of your heart. It provides your doctor with information about the size and shape of your heart and how well your heart's chambers and valves are working. This procedure takes approximately one hour. There are no restrictions for this procedure.    Follow-Up: At [facility], you and your health needs are our priority.  As part of our continuing mission to provide you with exceptional heart care, we have created designated Provider Care Teams.  These Care Teams include your primary Cardiologist (physician) and Advanced Practice Providers (APPs -  Physician Assistants and Nurse Practitioners) who all work together to provide you with the care you need, when you need it.  We recommend signing up for the patient portal called "MyChart".  Sign up information is provided on this After Visit Summary.  MyChart is used to connect with patients for Virtual Visits (Telemedicine).  Patients are able to view lab/test results, encounter notes, upcoming appointments, etc.  Non-urgent messages can be sent to your provider as well.   To learn more about what you can do with  MyChart, go to [email or web address].    Your next appointment:   6 month(s)  The format for your next appointment:   In Person  Provider:   [name], MD     Other Instructions   Important Information About Sugar

## 2021-12-17 DIAGNOSIS — E1122 Type 2 diabetes mellitus with diabetic chronic kidney disease: Secondary | ICD-10-CM | POA: Diagnosis not present

## 2021-12-17 DIAGNOSIS — I129 Hypertensive chronic kidney disease with stage 1 through stage 4 chronic kidney disease, or unspecified chronic kidney disease: Secondary | ICD-10-CM | POA: Diagnosis not present

## 2021-12-17 DIAGNOSIS — N3289 Other specified disorders of bladder: Secondary | ICD-10-CM | POA: Diagnosis not present

## 2021-12-17 DIAGNOSIS — K222 Esophageal obstruction: Secondary | ICD-10-CM | POA: Diagnosis not present

## 2021-12-17 DIAGNOSIS — N184 Chronic kidney disease, stage 4 (severe): Secondary | ICD-10-CM | POA: Diagnosis not present

## 2021-12-17 DIAGNOSIS — I2510 Atherosclerotic heart disease of native coronary artery without angina pectoris: Secondary | ICD-10-CM | POA: Diagnosis not present

## 2021-12-17 DIAGNOSIS — R809 Proteinuria, unspecified: Secondary | ICD-10-CM | POA: Diagnosis not present

## 2021-12-23 NOTE — Telephone Encounter (Signed)
Patient called in wanting to know did you receive the form this time around?

## 2021-12-23 NOTE — Telephone Encounter (Signed)
Spoke to pt. He said they have not received it from [place]. I refaxed it. He will let them know.

## 2021-12-24 ENCOUNTER — Ambulatory Visit: Payer: Medicare Other | Admitting: Podiatry

## 2021-12-30 NOTE — Telephone Encounter (Signed)
He could try Anoro which is a dry powder inhaler or Bevespi which is an HFA inhaler. Can you ask which he would prefer and we can send in RX.

## 2022-01-01 MED ORDER — BEVESPI AEROSPHERE 9-4.8 MCG/ACT IN AERO: 2.0000 | INHALATION_SPRAY | Freq: Two times a day (BID) | RESPIRATORY_TRACT | 5 refills | Status: DC

## 2022-01-04 ENCOUNTER — Other Ambulatory Visit: Payer: Self-pay | Admitting: Cardiovascular Disease

## 2022-01-04 NOTE — Telephone Encounter (Signed)
Rx for [name] was sent to pharmacy for pt 9/29.

## 2022-01-06 ENCOUNTER — Ambulatory Visit: Payer: Medicare Other | Admitting: Podiatry

## 2022-01-07 ENCOUNTER — Other Ambulatory Visit: Payer: Self-pay | Admitting: Internal Medicine

## 2022-01-07 ENCOUNTER — Telehealth: Payer: Self-pay | Admitting: Internal Medicine

## 2022-01-07 NOTE — Telephone Encounter (Signed)
Pt's daughter called asking is it fine for pt to take extra strength tylenol? Pt's daughter stated EMS was called for father on today, 01/07/22 because pt was having chest pain & difficulty breathing. EMS stated vitals was fine, pt could've been dehydrated. Pt's daughter wants advice on what is ok for father to take? Call back # [phone]

## 2022-01-08 ENCOUNTER — Encounter: Payer: Self-pay | Admitting: Cardiovascular Disease

## 2022-01-08 NOTE — Telephone Encounter (Signed)
I spoke with Katharine Look (DPR signed) and Katharine Look notified as instructed by Dr Silvio Pate and Katharine Look voiced understanding and appreciative of call.

## 2022-01-15 ENCOUNTER — Telehealth: Payer: Self-pay | Admitting: Cardiovascular Disease

## 2022-01-15 DIAGNOSIS — R0689 Other abnormalities of breathing: Secondary | ICD-10-CM | POA: Diagnosis not present

## 2022-01-15 DIAGNOSIS — E119 Type 2 diabetes mellitus without complications: Secondary | ICD-10-CM | POA: Diagnosis not present

## 2022-01-15 DIAGNOSIS — J8410 Pulmonary fibrosis, unspecified: Secondary | ICD-10-CM | POA: Diagnosis not present

## 2022-01-15 NOTE — Telephone Encounter (Signed)
Called patient back to let him know, per our pharmacist, it is fine to take arthritidis tylenol. Patient verbalized understanding.

## 2022-01-15 NOTE — Telephone Encounter (Signed)
Pt c/o medication issue:  1. Name of Medication: Arthritidis Tylenol  2. How are you currently taking this medication (dosage and times per day)?   3. Are you having a reaction (difficulty breathing--STAT)?   4. What is your medication issue? Patient wants to know if it is alright for him to take this medicine

## 2022-01-15 NOTE — Telephone Encounter (Signed)
Yes should be fine

## 2022-01-18 ENCOUNTER — Encounter: Payer: Self-pay | Admitting: Internal Medicine

## 2022-01-18 ENCOUNTER — Ambulatory Visit (INDEPENDENT_AMBULATORY_CARE_PROVIDER_SITE_OTHER): Payer: Medicare Other | Admitting: Internal Medicine

## 2022-01-18 DIAGNOSIS — I5032 Chronic diastolic (congestive) heart failure: Secondary | ICD-10-CM

## 2022-01-18 DIAGNOSIS — G479 Sleep disorder, unspecified: Secondary | ICD-10-CM | POA: Diagnosis not present

## 2022-01-18 DIAGNOSIS — E1140 Type 2 diabetes mellitus with diabetic neuropathy, unspecified: Secondary | ICD-10-CM

## 2022-01-18 DIAGNOSIS — I25119 Atherosclerotic heart disease of native coronary artery with unspecified angina pectoris: Secondary | ICD-10-CM | POA: Diagnosis not present

## 2022-01-18 LAB — POCT GLYCOSYLATED HEMOGLOBIN (HGB A1C): Hemoglobin A1C: 8.7 % — AB (ref 4.0–5.6)

## 2022-01-18 MED ORDER — NORTRIPTYLINE HCL 10 MG PO CAPS: 10.0000 mg | ORAL_CAPSULE | Freq: Every day | ORAL | 3 refills | Status: DC

## 2022-01-18 MED ORDER — GLIPIZIDE 5 MG PO TABS: 5.0000 mg | ORAL_TABLET | Freq: Two times a day (BID) | ORAL | 3 refills | Status: DC

## 2022-01-18 NOTE — Assessment & Plan Note (Signed)
Recent anginal spell Nitro did help Going back to cardiology soonstill on losartan 25, asa 81, plavix

## 2022-01-18 NOTE — Assessment & Plan Note (Signed)
Now seems to be most related to nocturia---which is worse since bladder procedure and seems to be more irritable bladder Tolerated past nortriptyline--though not effective for neuropathy Will try for the bladder--- 10-20 at bedtime

## 2022-01-18 NOTE — Progress Notes (Signed)
Subjective:    Patient ID: [name], male    DOB: Jul 05, 1931, 88 y.o.   MRN: [id number]  HPI Here for follow up of diabetes and other chronic health conditions With wife and daughter  Still doesn't feel good Did have a better day--and was able to get to church once Doesn't feel sick--just weak and no energy  Back on lyrica at bedtime--- taking $RemoveBeforeDEI'50mg'DJTAmWmmwWcjnCph$  at bedtime Sleeping okay--though has nocturia x 7-8 per night  Still on the oxycodone $RemoveBefor'10mg'fhICBAEJhynR$  bid  Did have anginal attack Needed nitro and EMTs came EKG fairly consistent--?T wave inversions F and V6 Chronic SOB---about the same  Doesn't feel like he is depressed Just trouble sleeping and chronic pain  Current Outpatient Medications on File Prior to Visit  Medication Sig Dispense Refill   acetaminophen (TYLENOL) 650 MG CR tablet Take 650 mg by mouth every 6 (six) hours as needed for pain.     amLODipine (NORVASC) 5 MG tablet Take 5 mg by mouth at bedtime.     aspirin EC 81 MG tablet Take 81 mg by mouth at bedtime.     Blood Glucose Monitoring Suppl (ONE TOUCH ULTRA 2) w/Device KIT Use to obtain blood sugar daily. Dx Code E11.40 1 kit 0   clopidogrel (PLAVIX) 75 MG tablet TAKE 1 TABLET BY MOUTH ONCE A DAY 90 tablet 3   Ferrous Sulfate 28 MG TABS Take 28 mg by mouth 2 (two) times a week.     finasteride (PROSCAR) 5 MG tablet Take 5 mg by mouth daily.     fluticasone (CUTIVATE) 0.05 % cream Apply 1 application. topically daily as needed (irritation).     glucose blood (ONE TOUCH ULTRA TEST) test strip USE TO CHECK BLOOD SUGAR ONCE A DAY Dx Code E11.40 100 each 3   Glycopyrrolate-Formoterol (BEVESPI AEROSPHERE) 9-4.8 MCG/ACT AERO Inhale 2 puffs into the lungs 2 (two) times daily. 10.7 g 5   ketoconazole (NIZORAL) 2 % shampoo Apply 1 application. topically 2 (two) times a week.     losartan (COZAAR) 25 MG tablet Take 50 mg by mouth daily.     Multiple Vitamins-Minerals (ICAPS AREDS 2 PO) Take 1 capsule by mouth daily.      nitroGLYCERIN (NITROSTAT) 0.4 MG SL tablet DISSOLVE 1 TABLET UNDER TONGUE AS NEEDEDFOR CHEST PAIN. MAY REPEAT 5 MINUTES APART 3 TIMES IF NEEDED 25 tablet 0   ondansetron (ZOFRAN) 4 MG tablet Take 1 tablet (4 mg total) by mouth every 8 (eight) hours. 90 tablet 1   OneTouch Delica Lancets 59Y MISC 1 each by In Vitro route daily. Dx Code E11.49 100 each 3   OXYGEN Inhale 2 L into the lungs at bedtime. May use in the day time if he gets "winded"     pantoprazole (PROTONIX) 40 MG tablet TAKE 1 TABLET BY MOUTH TWICE (2) DAILY 180 tablet 3   polyethylene glycol (MIRALAX / GLYCOLAX) 17 g packet Take 17 g by mouth every other day. 1 each 0   pregabalin (LYRICA) 25 MG capsule Take 50 mg by mouth at bedtime.     tamsulosin (FLOMAX) 0.4 MG CAPS capsule Take 0.4 mg by mouth at bedtime.     No current facility-administered medications on file prior to visit.    Allergies  Allergen Reactions   Metronidazole     Kidney Dr does not want pt to take    Doxazosin Mesylate Other (See Comments)    dizziness   Methocarbamol Rash    Past Medical  History:  Diagnosis Date   Aortic stenosis    Arthritis    osteoarthritis, s/p R TKR, and digits   CAD (coronary artery disease)    a. s/p CABG (2001)  b. s/p DES to RCA and cutting POBA to ostial PDA (2013)   c. s/p DES to SVG to OM2 (01/14/14) d. cath: 08/2015 NSTEMI w/ patent LIMA-LAD and 99% stenosis of SVG-OM w/ DES placed. CTO of SVG-RCA and SVG-D1.    Cancer [facility])    bladder  and skin   Cataract    Chronic diastolic CHF (congestive heart failure) ([name])    a) 09/13 ECHO- LVEF 50-55%, grade 1 diastolic dysfunction, mild LA dilatation, atrial septal aneurysm, AV mobility restricted, but no sig AS by doppler; b) 09/04/08 ECHO- LVH, ef 60%, mild AS, c. echo 08/2015: EF perserved of 55-60% with inferolateral HK. Mild AS noted.   Chronic kidney disease, stage III (moderate) ([name])    Chronic lower back pain    Colon polyps    COVID-19    Diverticulosis    Dyspnea  2009 since July -Sept   05/06/08-CPST-  normal effort, reduced VO2 max 20.5 /65%, reduced at 8.2/ 40%, normal breathing resetvca of 55%, submaximal heart rate response 112/77%, flattened o2 pluse response at peak exercise-12 ml/beat @ 85%, No VQ mismatch abnormalities, All c/w CIRC Limitation   Enlarged prostate    Esophageal stricture    a. s/p dilation spring 2010   GERD (gastroesophageal reflux disease)    Heart murmur    Hiatal hernia    History of carpal tunnel syndrome    Bilateral   History of kidney stones    History of PFTs    mixed pattern on spiro. mild restn on lung volumes with near normal DLCO. Pattern can be explained by CABG scar. Fev1 2.2L/73%, ratio 68 (67), TLC 4.7/68%,RV 1.5L/55%,DLCO 79%   Hyperlipidemia    Hypertension    Interstitial lung disease ([name])    NOS   Iron deficiency anemia    Myocardial infarction ([name])    Nausea & vomiting    2018/2019   On home oxygen therapy    2 L [place] at bedtime   Osteoporosis    Overweight (BMI 25.0-29.9)    BMI 29   Peripheral neuropathy    RA (rheumatoid arthritis) ([name])    Dr Titus Dubin   Seropositive rheumatoid arthritis ([name])    Type II diabetes mellitus ([name])    diet controlled   Wears glasses    Wears partial dentures    upper    Past Surgical History:  Procedure Laterality Date   BALLOON DILATION N/A 09/12/2018   Procedure: BALLOON DILATION;  Surgeon: Hilarie Fredrickson, MD;  Location: [facility];  Service: Endoscopy;  Laterality: N/A;   BALLOON DILATION N/A 02/20/2020   Procedure: BALLOON DILATION;  Surgeon: Hilarie Fredrickson, MD;  Location: [facility];  Service: Endoscopy;  Laterality: N/A;   BALLOON DILATION N/A 08/24/2021   Procedure: BALLOON DILATION;  Surgeon: Hilarie Fredrickson, MD;  Location: [facility];  Service: Gastroenterology;  Laterality: N/A;   BOTOX INJECTION N/A 09/12/2018   Procedure: BOTOX INJECTION;  Surgeon: Hilarie Fredrickson, MD;  [facility];  Service: Endoscopy;  Laterality: N/A;   BOTOX  INJECTION N/A 11/27/2018   Procedure: BOTOX INJECTION;  Surgeon: Hilarie Fredrickson, MD;  [facility];  Service: Endoscopy;  Laterality: N/A;   BOTOX INJECTION N/A 02/20/2020   Procedure: BOTOX INJECTION;  Surgeon: Hilarie Fredrickson, MD;  [facility];  Service: Endoscopy;  Laterality: N/A;   BOTOX INJECTION  08/24/2021   Procedure: BOTOX INJECTION;  Surgeon: Irene Shipper, MD;  Location: [facility];  Service: Gastroenterology;;   CARDIAC CATHETERIZATION  08/2004   CP- no MI, Cath- small vessell disease    CARDIAC CATHETERIZATION  12/31/2011   80% distal LM, 100% native LAD, LCx and RCA, 30% prox SVG-OM, SVG-D1 normal, 99% distal, 80% ostial SVG-RCA distal to graft, LIMA-LAD normal; LVEF mildly decreased with posterior basal AK    CARDIAC CATHETERIZATION  2009   with patent grafts/notes 12/31/2011   CARDIAC CATHETERIZATION N/A 08/13/2015   Procedure: Left Heart Cath and Cors/Grafts Angiography;  Surgeon: Sherren Mocha, MD;  Location: [facility];  Service: Cardiovascular;  Laterality: N/A;   CATARACT EXTRACTION W/ INTRAOCULAR LENS  IMPLANT, BILATERAL Bilateral    CHOLECYSTECTOMY OPEN  11/2003   [name]   CORONARY ANGIOPLASTY WITH STENT PLACEMENT  01/03/2012   Successful DES to SVG-RCA and cutting balloon angioplasty ostial  PDA    CORONARY ANGIOPLASTY WITH STENT PLACEMENT  01/14/2014   "1"   CORONARY ARTERY BYPASS GRAFT  11/1999   CABG X5   CORONARY STENT PLACEMENT  02/2012   1 stent and balloon   CYSTOSCOPY WITH BIOPSY N/A 03/17/2021   Procedure: DIAGNOSTIC CYSTOSCOPY WITH BIOPSY;  Surgeon: Robley Fries, MD;  [facility];  Service: Urology;  Laterality: N/A;  1 HR   CYSTOSCOPY WITH RETROGRADE PYELOGRAM, URETEROSCOPY AND STENT PLACEMENT Right 07/28/2021   Procedure: CYSTOSCOPY WITH FULGERATION, STENT PLACEMENT, RETROGRADE PYELOGRAM,  DIAGNOSTIC URETEROSCOPY;  Surgeon: Robley Fries, MD;  [facility];  Service: Urology;  Laterality: Right;   ESOPHAGEAL  DILATION  11/27/2018   Procedure: ESOPHAGEAL DILATION;  Surgeon: Irene Shipper, MD;  [facility];  Service: Endoscopy;;   ESOPHAGOGASTRODUODENOSCOPY N/A 03/01/2017   Procedure: ESOPHAGOGASTRODUODENOSCOPY (EGD);  Surgeon: Irene Shipper, MD;  Location: [facility];  Service: Endoscopy;  Laterality: N/A;   ESOPHAGOGASTRODUODENOSCOPY (EGD) WITH ESOPHAGEAL DILATION  2010   ESOPHAGOGASTRODUODENOSCOPY (EGD) WITH PROPOFOL N/A 09/12/2018   Procedure: ESOPHAGOGASTRODUODENOSCOPY (EGD) WITH PROPOFOL;  Surgeon: Irene Shipper, MD;  [facility];  Service: Endoscopy;  Laterality: N/A;   ESOPHAGOGASTRODUODENOSCOPY (EGD) WITH PROPOFOL N/A 11/27/2018   Procedure: ESOPHAGOGASTRODUODENOSCOPY (EGD) WITH PROPOFOL, WITH BALLOON DILATION;  Surgeon: Irene Shipper, MD;  [facility];  Service: Endoscopy;  Laterality: N/A;   ESOPHAGOGASTRODUODENOSCOPY (EGD) WITH PROPOFOL N/A 02/20/2020   Procedure: ESOPHAGOGASTRODUODENOSCOPY (EGD) WITH PROPOFOL;  Surgeon: Irene Shipper, MD;  [facility];  Service: Endoscopy;  Laterality: N/A;   ESOPHAGOGASTRODUODENOSCOPY (EGD) WITH PROPOFOL N/A 08/24/2021   Procedure: ESOPHAGOGASTRODUODENOSCOPY (EGD) WITH PROPOFOL;  Surgeon: Irene Shipper, MD;  [facility];  Service: Gastroenterology;  Laterality: N/A;   EXTRACORPOREAL SHOCK WAVE LITHOTRIPSY Right 05/11/2021   Procedure: EXTRACORPOREAL SHOCK WAVE LITHOTRIPSY (ESWL);  Surgeon: [name], MD;  Location: [facility];  Service: Urology;  Laterality: Right;   HAND SURGERY     bilateral carpal tunnel releases   JOINT REPLACEMENT     KNEE ARTHROSCOPY Right 2008   LEFT AND RIGHT HEART CATHETERIZATION WITH CORONARY ANGIOGRAM  12/31/2011   Procedure: LEFT AND RIGHT HEART CATHETERIZATION WITH CORONARY ANGIOGRAM;  Surgeon: Burnell Blanks, MD;  Location[facility];  Service: Cardiovascular;;   LEFT AND RIGHT HEART CATHETERIZATION WITH CORONARY ANGIOGRAM N/A 01/14/2014    Procedure: LEFT AND RIGHT HEART CATHETERIZATION WITH CORONARY ANGIOGRAM;  Surgeon: Peter M Martinique, MD;  Location: [facility];  Service: Cardiovascular;  Laterality: N/A;   MALONEY DILATION  03/01/2017   Procedure: Venia Minks DILATION;  Surgeon: Irene Shipper, MD;  [facility];  Service: Endoscopy;;   PERCUTANEOUS CORONARY INTERVENTION-BALLOON ONLY  01/03/2012   Procedure: PERCUTANEOUS CORONARY INTERVENTION-BALLOON ONLY;  Surgeon: Peter M Martinique, MD;  Location: [facility] CATH LAB;  Service: Cardiovascular;;   PERCUTANEOUS CORONARY STENT INTERVENTION (PCI-S)  12/31/2011   Procedure: PERCUTANEOUS CORONARY STENT INTERVENTION (PCI-S);  Surgeon: Burnell Blanks, MD;  Location: [facility] CATH LAB;  Service: Cardiovascular;;   PERCUTANEOUS CORONARY STENT INTERVENTION (PCI-S) N/A 01/03/2012   Procedure: PERCUTANEOUS CORONARY STENT INTERVENTION (PCI-S);  Surgeon: Peter M Martinique, MD;  Location: [facility] CATH LAB;  Service: Cardiovascular;  Laterality: N/A;   SHOULDER ARTHROSCOPY WITH OPEN ROTATOR CUFF REPAIR AND DISTAL CLAVICLE ACROMINECTOMY Left 02/27/2013   Procedure: LEFT SHOULDER ARTHROSCOPY WITH MINI OPEN ROTATOR CUFF REPAIR AND SUBACROMIAL DECOMPRESSION AND DISTAL CLAVICLE RESECTION;  Surgeon: [name], MD;  Location: [facility];  Service: Orthopedics;  Laterality: Left;   TOTAL KNEE ARTHROPLASTY Right 03/2010   [name]   TRANSURETHRAL RESECTION OF BLADDER TUMOR N/A 03/17/2021   Procedure: TRANSURETHRAL RESECTION OF BLADDER TUMOR (TURBT);  Surgeon: Robley Fries, MD;  [facility];  Service: Urology;  Laterality: N/A;   TRANSURETHRAL RESECTION OF BLADDER TUMOR WITH MITOMYCIN-C N/A 10/08/2020   Procedure: TRANSURETHRAL RESECTION OF BLADDER TUMOR WITH GEMCITABINE;  Surgeon: Robley Fries, MD;  Location[facility];  Service: Urology;  Laterality: N/A;  75 MINS   TRIGGER FINGER RELEASE Left 02/27/2013   Procedure: RELEASE TRIGGER FINGER/A-1 PULLEY;  Surgeon: [name], MD;  Location: [facility];   Service: Orthopedics;  Laterality: Left;    Family History  Problem Relation Age of Onset   COPD Mother    Heart disease Father    Heart attack Father    Stomach cancer Brother    Stroke Sister    Alcohol abuse Sister    Colon cancer Brother 80   Diabetes Brother    Rectal cancer Neg Hx     Social History   Socioeconomic History   Marital status: Married    Spouse name: Not on file   Number of children: 3   Years of education: Not on file   Highest education level: Not on file  Occupational History   Occupation: Designer, jewellery: RETIRED    Comment: retired  Tobacco Use   Smoking status: Former    Packs/day: 1.00    Years: 20.00    Total pack years: 20.00    Types: Cigarettes    Quit date: 04/06/1963    Years since quitting: 58.8    Passive exposure: Current   Smokeless tobacco: Former  Scientific laboratory technician Use: Never used  Substance and Sexual Activity   Alcohol use: No    Alcohol/week: 0.0 standard drinks of alcohol    Comment: 01/01/2012 "last alcohol ~ 50 yr ago"   Drug use: No   Sexual activity: Not Currently  Other Topics Concern   Not on file  Social History Narrative   No living will   Requests wife as health care POA-- alternate is daughter Hassan Rowan   Discussed DNR --he requests this (done 08/29/12)   Not sure about feeding tube---but might accept for some time   Patient lives with wife and daughter in a one story home.  Has 3 children.  Retired from working in Teacher, adult education care. Education: 9th  grade.      Right Handed    Social Determinants of Health   Financial Resource Strain: Low Risk  (07/25/2020)   Overall Financial Resource Strain (CARDIA)    Difficulty of Paying Living Expenses: Not very hard  Food Insecurity: Not on file  Transportation Needs: Not on file  Physical Activity: Not on file  Stress: Not on file  Social Connections: Not on file  Intimate Partner Violence: Not on file   Review of Systems Appetite is good Weight stable Checks  sugars regularly--had some over 200 last week     Objective:   Physical Exam Constitutional:      Appearance: Normal appearance.  Cardiovascular:     Rate and Rhythm: Normal rate and regular rhythm.     Heart sounds:     No gallop.     Comments: Feet warm with faint pulses Soft systolic murmur Pulmonary:     Effort: Pulmonary effort is normal.     Breath sounds: No wheezing.  Musculoskeletal:     Cervical back: Neck supple.  Lymphadenopathy:     Cervical: No cervical adenopathy.  Skin:    Comments: No foot lesions  Neurological:     Mental Status: He is alert.     Comments: Decreased sensation in feet  Psychiatric:     Comments: Frustrated but not really depressed            Assessment & Plan:

## 2022-01-18 NOTE — Patient Instructions (Signed)
Please start glipizide for your diabetes and let me know if your fasting sugars stay over 160. Try the nortriptyline at bedtime (along with the lyrica)----to see if it helps your bladder. Start with 1 ('10mg'$ ) but increase to 2 ('20mg'$ ) after 1-2 weeks if you are still going to the bathroom a lot.

## 2022-01-18 NOTE — Assessment & Plan Note (Addendum)
Will check A1c Still no meds for sugar---but lyrica has helped a little ('50mg'$  bedtime) Also tylenol bid Lab Results  Component Value Date   HGBA1C 8.7 (A) 01/18/2022   Control is much worse Will add glipizide '5mg'$  daily

## 2022-01-18 NOTE — Assessment & Plan Note (Signed)
Still seems to be compensated

## 2022-01-23 ENCOUNTER — Other Ambulatory Visit: Payer: Self-pay | Admitting: Internal Medicine

## 2022-01-23 DIAGNOSIS — G8929 Other chronic pain: Secondary | ICD-10-CM

## 2022-01-23 DIAGNOSIS — G894 Chronic pain syndrome: Secondary | ICD-10-CM

## 2022-01-23 DIAGNOSIS — M47816 Spondylosis without myelopathy or radiculopathy, lumbar region: Secondary | ICD-10-CM

## 2022-01-23 DIAGNOSIS — M792 Neuralgia and neuritis, unspecified: Secondary | ICD-10-CM

## 2022-01-23 DIAGNOSIS — E1142 Type 2 diabetes mellitus with diabetic polyneuropathy: Secondary | ICD-10-CM

## 2022-01-25 NOTE — Telephone Encounter (Signed)
At his last OV, daughter and pt confirmed he is taking '50mg'$  at bedtime. He was taking #2 '25mg'$  until they were finished.

## 2022-02-01 ENCOUNTER — Ambulatory Visit (HOSPITAL_COMMUNITY): Payer: Medicare Other | Attending: Physician Assistant

## 2022-02-01 ENCOUNTER — Ambulatory Visit (HOSPITAL_BASED_OUTPATIENT_CLINIC_OR_DEPARTMENT_OTHER): Payer: Medicare Other

## 2022-02-01 DIAGNOSIS — I2510 Atherosclerotic heart disease of native coronary artery without angina pectoris: Secondary | ICD-10-CM

## 2022-02-01 DIAGNOSIS — I5032 Chronic diastolic (congestive) heart failure: Secondary | ICD-10-CM | POA: Diagnosis not present

## 2022-02-01 DIAGNOSIS — I350 Nonrheumatic aortic (valve) stenosis: Secondary | ICD-10-CM | POA: Diagnosis not present

## 2022-02-01 LAB — ECHOCARDIOGRAM COMPLETE
AR max vel: 1.26 cm2
AV Area VTI: 1.23 cm2
AV Area mean vel: 1.2 cm2
AV Mean grad: 13.6 mmHg
AV Peak grad: 23 mmHg
Ao pk vel: 2.4 m/s
Area-P 1/2: 4.08 cm2
P 1/2 time: 284 msec
S' Lateral: 3.8 cm

## 2022-02-02 LAB — COMPREHENSIVE METABOLIC PANEL
ALT: 8 IU/L (ref 0–44)
AST: 12 IU/L (ref 0–40)
Albumin/Globulin Ratio: 0.9 — ABNORMAL LOW (ref 1.2–2.2)
Albumin: 3.7 g/dL (ref 3.7–4.7)
Alkaline Phosphatase: 74 IU/L (ref 44–121)
BUN/Creatinine Ratio: 13 (ref 10–24)
BUN: 31 mg/dL — ABNORMAL HIGH (ref 8–27)
Bilirubin Total: 0.4 mg/dL (ref 0.0–1.2)
CO2: 25 mmol/L (ref 20–29)
Calcium: 9.9 mg/dL (ref 8.6–10.2)
Chloride: 100 mmol/L (ref 96–106)
Creatinine, Ser: 2.35 mg/dL — ABNORMAL HIGH (ref 0.76–1.27)
Globulin, Total: 4.2 g/dL (ref 1.5–4.5)
Glucose: 135 mg/dL — ABNORMAL HIGH (ref 70–99)
Potassium: 4.8 mmol/L (ref 3.5–5.2)
Sodium: 138 mmol/L (ref 134–144)
Total Protein: 7.9 g/dL (ref 6.0–8.5)
eGFR: 26 mL/min/{1.73_m2} — ABNORMAL LOW (ref >59)

## 2022-02-02 LAB — LIPID PANEL
Chol/HDL Ratio: 4.4 ratio (ref 0.0–5.0)
Cholesterol, Total: 181 mg/dL (ref 100–199)
HDL: 41 mg/dL (ref >39)
LDL Chol Calc (NIH): 114 mg/dL — ABNORMAL HIGH (ref 0–99)
Triglycerides: 145 mg/dL (ref 0–149)
VLDL Cholesterol Cal: 26 mg/dL (ref 5–40)

## 2022-02-03 ENCOUNTER — Encounter: Payer: Self-pay | Admitting: Internal Medicine

## 2022-02-04 ENCOUNTER — Telehealth: Payer: Self-pay | Admitting: Physician Assistant

## 2022-02-04 DIAGNOSIS — Z79899 Other long term (current) drug therapy: Secondary | ICD-10-CM

## 2022-02-04 MED ORDER — ROSUVASTATIN CALCIUM 10 MG PO TABS: 10.0000 mg | ORAL_TABLET | Freq: Every day | ORAL | 3 refills | Status: DC

## 2022-02-04 NOTE — Telephone Encounter (Signed)
Attempted to reach patient with lab results. [name] has posted on his MyChart as well. No answer at his primary number, secondary number listed is his daughter and we have DPR permission for her. Left detailed message of [name]'s comments and need to start Crestor. Informed her that we have sent medication to pharmacy on file and placed pt on lab schedule for 05/12/21, but to call office back if this date didn't work or if they had any questions about any of this.

## 2022-02-04 NOTE — Telephone Encounter (Signed)
-----   Message from Liliane Shi, [place] sent at 02/03/2022  6:10 PM EDT ----- Results sent to [name] via [facility]. See MyChart comment. PLAN: -Send copy to Nephrologist -Start Crestor 10 mg once daily -Lipids and LFTs in 3 mos Richardson Dopp, [place]    02/03/2022 6:07 PM

## 2022-02-04 NOTE — Telephone Encounter (Signed)
Daughter called wanting to know if it's save for patient to take this medication as he does have stage 3 kidney issues.  She wanted to know if it was discussed with his kidney doctor.  Please advise.

## 2022-02-05 ENCOUNTER — Encounter: Payer: Self-pay | Admitting: Physician Assistant

## 2022-02-05 NOTE — Telephone Encounter (Signed)
Returned call to pt's daughter, [name], [place] on file.  She has been made aware that the max dose for Crestor, for Kidney Diease, is 10 mg which is what the pt is prescribed.

## 2022-02-05 NOTE — Telephone Encounter (Signed)
Yes it is ok to take Crestor. Max dose of Crestor with his chronic kidney disease is 10 mg, which is what is prescribed.  Richardson Dopp, PA-C    02/05/2022 11:05 AM

## 2022-02-09 ENCOUNTER — Ambulatory Visit: Payer: Medicare Other | Admitting: Podiatry

## 2022-02-11 DIAGNOSIS — H353221 Exudative age-related macular degeneration, left eye, with active choroidal neovascularization: Secondary | ICD-10-CM | POA: Diagnosis not present

## 2022-02-11 DIAGNOSIS — H43821 Vitreomacular adhesion, right eye: Secondary | ICD-10-CM | POA: Diagnosis not present

## 2022-02-11 DIAGNOSIS — H35372 Puckering of macula, left eye: Secondary | ICD-10-CM | POA: Diagnosis not present

## 2022-02-11 DIAGNOSIS — D3131 Benign neoplasm of right choroid: Secondary | ICD-10-CM | POA: Diagnosis not present

## 2022-02-11 DIAGNOSIS — H353213 Exudative age-related macular degeneration, right eye, with inactive scar: Secondary | ICD-10-CM | POA: Diagnosis not present

## 2022-02-15 ENCOUNTER — Ambulatory Visit: Payer: Medicare Other | Admitting: Podiatry

## 2022-02-15 DIAGNOSIS — J8410 Pulmonary fibrosis, unspecified: Secondary | ICD-10-CM | POA: Diagnosis not present

## 2022-02-15 DIAGNOSIS — R0689 Other abnormalities of breathing: Secondary | ICD-10-CM | POA: Diagnosis not present

## 2022-02-22 ENCOUNTER — Telehealth: Payer: Self-pay | Admitting: Internal Medicine

## 2022-02-22 MED ORDER — HYDROCODONE-ACETAMINOPHEN 5-325 MG PO TABS: 1.0000 | ORAL_TABLET | ORAL | 0 refills | Status: DC | PRN

## 2022-02-22 NOTE — Telephone Encounter (Signed)
Left message on VM per DPR. This was the pt's home # not the daughter.

## 2022-02-22 NOTE — Telephone Encounter (Signed)
Pt daughter called in requesting Hydrocodone be called in for pt . Did not see RX on med list . Please advise # [phone]

## 2022-03-08 DIAGNOSIS — N184 Chronic kidney disease, stage 4 (severe): Secondary | ICD-10-CM | POA: Diagnosis not present

## 2022-03-11 ENCOUNTER — Other Ambulatory Visit: Payer: Self-pay | Admitting: Internal Medicine

## 2022-03-11 NOTE — Telephone Encounter (Signed)
Last filled 02-04-22 #30 Last OV 01-18-22 Next OV 03-12-22 (tomorrow) No CSA or UDS on file for our office. A UDS from Pain Dr 09-15-21. [place]

## 2022-03-12 ENCOUNTER — Ambulatory Visit (INDEPENDENT_AMBULATORY_CARE_PROVIDER_SITE_OTHER): Payer: Medicare Other | Admitting: Internal Medicine

## 2022-03-12 ENCOUNTER — Encounter: Payer: Self-pay | Admitting: Internal Medicine

## 2022-03-12 ENCOUNTER — Other Ambulatory Visit: Payer: Self-pay | Admitting: Internal Medicine

## 2022-03-12 VITALS — BP 136/86 | HR 97 | Temp 97.8°F | Ht 72.0 in | Wt 195.0 lb

## 2022-03-12 DIAGNOSIS — F1120 Opioid dependence, uncomplicated: Secondary | ICD-10-CM

## 2022-03-12 DIAGNOSIS — G8929 Other chronic pain: Secondary | ICD-10-CM

## 2022-03-12 DIAGNOSIS — M79604 Pain in right leg: Secondary | ICD-10-CM | POA: Diagnosis not present

## 2022-03-12 DIAGNOSIS — M79605 Pain in left leg: Secondary | ICD-10-CM | POA: Diagnosis not present

## 2022-03-12 MED ORDER — HYDROCODONE-ACETAMINOPHEN 10-325 MG PO TABS: 1.0000 | ORAL_TABLET | Freq: Two times a day (BID) | ORAL | 0 refills | Status: DC

## 2022-03-12 NOTE — Assessment & Plan Note (Signed)
PDMP reviewed No concerns 

## 2022-03-12 NOTE — Progress Notes (Signed)
Subjective:    Patient ID: [name], male    DOB: May 14, 1931, 88 y.o.   MRN: [id number]  HPI Here due to increasing problems with pain--and need to restart chronic narcotic therapy With daughter and wife  Has tried on lower dose of hydrocodone-but the pain is much worse With higher dose---foot pain is definitely decreased He feels the 10/325 twice a day is the best  Continues on the lyrica--bedtime  Current Outpatient Medications on File Prior to Visit  Medication Sig Dispense Refill   acetaminophen (TYLENOL) 650 MG CR tablet Take 650 mg by mouth every 6 (six) hours as needed for pain.     amLODipine (NORVASC) 5 MG tablet Take 5 mg by mouth at bedtime.     aspirin EC 81 MG tablet Take 81 mg by mouth at bedtime.     Blood Glucose Monitoring Suppl (ONE TOUCH ULTRA 2) w/Device KIT Use to obtain blood sugar daily. Dx Code E11.40 1 kit 0   clopidogrel (PLAVIX) 75 MG tablet TAKE 1 TABLET BY MOUTH ONCE A DAY 90 tablet 3   Ferrous Sulfate 28 MG TABS Take 28 mg by mouth 2 (two) times a week.     finasteride (PROSCAR) 5 MG tablet Take 5 mg by mouth daily.     fluticasone (CUTIVATE) 0.05 % cream Apply 1 application. topically daily as needed (irritation).     glipiZIDE (GLUCOTROL) 5 MG tablet Take 1 tablet (5 mg total) by mouth 2 (two) times daily before a meal. 90 tablet 3   glucose blood (ONE TOUCH ULTRA TEST) test strip USE TO CHECK BLOOD SUGAR ONCE A DAY Dx Code E11.40 100 each 3   Glycopyrrolate-Formoterol (BEVESPI AEROSPHERE) 9-4.8 MCG/ACT AERO Inhale 2 puffs into the lungs 2 (two) times daily. 10.7 g 5   HYDROcodone-acetaminophen (NORCO/VICODIN) 5-325 MG tablet TAKE 1 TABLET BY MOUTH EVERY 4 HOURS AS NEEDED FOR MODERATE PAIN 30 tablet 0   ketoconazole (NIZORAL) 2 % shampoo Apply 1 application. topically 2 (two) times a week.     losartan (COZAAR) 25 MG tablet Take 50 mg by mouth daily.     Multiple Vitamins-Minerals (ICAPS AREDS 2 PO) Take 1 capsule by mouth daily.      nitroGLYCERIN (NITROSTAT) 0.4 MG SL tablet DISSOLVE 1 TABLET UNDER TONGUE AS NEEDEDFOR CHEST PAIN. MAY REPEAT 5 MINUTES APART 3 TIMES IF NEEDED 25 tablet 0   nortriptyline (PAMELOR) 10 MG capsule Take 1-2 capsules (10-20 mg total) by mouth at bedtime. 60 capsule 3   ondansetron (ZOFRAN) 4 MG tablet Take 1 tablet (4 mg total) by mouth every 8 (eight) hours. 90 tablet 1   OneTouch Delica Lancets 99M MISC 1 each by In Vitro route daily. Dx Code E11.49 100 each 3   OXYGEN Inhale 2 L into the lungs at bedtime. May use in the day time if he gets "winded"     pantoprazole (PROTONIX) 40 MG tablet TAKE 1 TABLET BY MOUTH TWICE (2) DAILY 180 tablet 3   polyethylene glycol (MIRALAX / GLYCOLAX) 17 g packet Take 17 g by mouth every other day. 1 each 0   pregabalin (LYRICA) 50 MG capsule Take 1 capsule (50 mg total) by mouth at bedtime. 30 capsule 5   rosuvastatin (CRESTOR) 10 MG tablet Take 1 tablet (10 mg total) by mouth daily. 90 tablet 3   tamsulosin (FLOMAX) 0.4 MG CAPS capsule Take 0.4 mg by mouth at bedtime.     No current facility-administered medications on file prior to  visit.    Allergies  Allergen Reactions   Metronidazole     Kidney Dr does not want pt to take    Doxazosin Mesylate Other (See Comments)    dizziness   Methocarbamol Rash    Past Medical History:  Diagnosis Date   Aortic stenosis    Arthritis    osteoarthritis, s/p R TKR, and digits   CAD (coronary artery disease)    a. s/p CABG (2001)  b. s/p DES to RCA and cutting POBA to ostial PDA (2013)   c. s/p DES to SVG to OM2 (01/14/14) d. cath: 08/2015 NSTEMI w/ patent LIMA-LAD and 99% stenosis of SVG-OM w/ DES placed. CTO of SVG-RCA and SVG-D1.    Cancer [facility])    bladder  and skin   Cataract    Chronic diastolic CHF (congestive heart failure) ([place])    a) 09/13 ECHO- LVEF 72-53%, grade 1 diastolic dysfunction, mild LA dilatation, atrial septal aneurysm, AV mobility restricted, but no sig AS by doppler; b) 09/04/08 ECHO- LVH, ef  60%, mild AS, c. echo 08/2015: EF perserved of 55-60% with inferolateral HK. Mild AS noted.   Chronic kidney disease, stage III (moderate) ([name])    Chronic lower back pain    Colon polyps    COVID-19    Diverticulosis    Dyspnea 2009 since July -Sept   05/06/08-CPST-  normal effort, reduced VO2 max 20.5 /65%, reduced at 8.2/ 40%, normal breathing resetvca of 55%, submaximal heart rate response 112/77%, flattened o2 pluse response at peak exercise-12 ml/beat @ 85%, No VQ mismatch abnormalities, All c/w CIRC Limitation   Enlarged prostate    Esophageal stricture    a. s/p dilation spring 2010   GERD (gastroesophageal reflux disease)    Heart murmur    Hiatal hernia    History of carpal tunnel syndrome    Bilateral   History of kidney stones    History of PFTs    mixed pattern on spiro. mild restn on lung volumes with near normal DLCO. Pattern can be explained by CABG scar. Fev1 2.2L/73%, ratio 68 (67), TLC 4.7/68%,RV 1.5L/55%,DLCO 79%   Hyperlipidemia    Hypertension    Interstitial lung disease ([name])    NOS   Iron deficiency anemia    Myocardial infarction ([name])    Nausea & vomiting    2018/2019   On home oxygen therapy    2 L [place] at bedtime   Osteoporosis    Overweight (BMI 25.0-29.9)    BMI 29   Peripheral neuropathy    RA (rheumatoid arthritis) ([place])    [name]   Seropositive rheumatoid arthritis ([place])    Type II diabetes mellitus ([place])    diet controlled   Wears glasses    Wears partial dentures    upper    Past Surgical History:  Procedure Laterality Date   BALLOON DILATION N/A 09/12/2018   Procedure: BALLOON DILATION;  Surgeon: Irene Shipper, MD;  Location: [facility];  Service: Endoscopy;  Laterality: N/A;   BALLOON DILATION N/A 02/20/2020   Procedure: BALLOON DILATION;  Surgeon: Irene Shipper, MD;  [facility]: [facility];  Service: Endoscopy;  Laterality: N/A;   BALLOON DILATION N/A 08/24/2021   Procedure: BALLOON DILATION;  Surgeon: Irene Shipper, MD;   Location: [facility];  Service: Gastroenterology;  Laterality: N/A;   BOTOX INJECTION N/A 09/12/2018   Procedure: BOTOX INJECTION;  Surgeon: Irene Shipper, MD;  [facility];  Service: Endoscopy;  Laterality: N/A;  BOTOX INJECTION N/A 11/27/2018   Procedure: BOTOX INJECTION;  Surgeon: Irene Shipper, MD;  [facility];  Service: Endoscopy;  Laterality: N/A;   BOTOX INJECTION N/A 02/20/2020   Procedure: BOTOX INJECTION;  Surgeon: Irene Shipper, MD;  [facility];  Service: Endoscopy;  Laterality: N/A;   BOTOX INJECTION  08/24/2021   Procedure: BOTOX INJECTION;  Surgeon: Irene Shipper, MD;  [facility]: [facility];  Service: Gastroenterology;;   CARDIAC CATHETERIZATION  08/2004   CP- no MI, Cath- small vessell disease    CARDIAC CATHETERIZATION  12/31/2011   80% distal LM, 100% native LAD, LCx and RCA, 30% prox SVG-OM, SVG-D1 normal, 99% distal, 80% ostial SVG-RCA distal to graft, LIMA-LAD normal; LVEF mildly decreased with posterior basal AK    CARDIAC CATHETERIZATION  2009   with patent grafts/notes 12/31/2011   CARDIAC CATHETERIZATION N/A 08/13/2015   Procedure: Left Heart Cath and Cors/Grafts Angiography;  Surgeon: Sherren Mocha, MD;  Location: [place] [facility];  Service: Cardiovascular;  Laterality: N/A;   CATARACT EXTRACTION W/ INTRAOCULAR LENS  IMPLANT, BILATERAL Bilateral    CHOLECYSTECTOMY OPEN  11/2003   [name]   CORONARY ANGIOPLASTY WITH STENT PLACEMENT  01/03/2012   Successful DES to SVG-RCA and cutting balloon angioplasty ostial  PDA    CORONARY ANGIOPLASTY WITH STENT PLACEMENT  01/14/2014   "1"   CORONARY ARTERY BYPASS GRAFT  11/1999   CABG X5   CORONARY STENT PLACEMENT  02/2012   1 stent and balloon   CYSTOSCOPY WITH BIOPSY N/A 03/17/2021   Procedure: DIAGNOSTIC CYSTOSCOPY WITH BIOPSY;  Surgeon: Robley Fries, MD;  [facility];  Service: Urology;  Laterality: N/A;  1 HR   CYSTOSCOPY WITH RETROGRADE PYELOGRAM, URETEROSCOPY AND STENT PLACEMENT  Right 07/28/2021   Procedure: CYSTOSCOPY WITH FULGERATION, STENT PLACEMENT, RETROGRADE PYELOGRAM,  DIAGNOSTIC URETEROSCOPY;  Surgeon: Robley Fries, MD;  Location[facility];  Service: Urology;  Laterality: Right;   ESOPHAGEAL DILATION  11/27/2018   Procedure: ESOPHAGEAL DILATION;  Surgeon: Irene Shipper, MD;  [facility];  Service: Endoscopy;;   ESOPHAGOGASTRODUODENOSCOPY N/A 03/01/2017   Procedure: ESOPHAGOGASTRODUODENOSCOPY (EGD);  Surgeon: Irene Shipper, MD;  Location[facility];  Service: Endoscopy;  Laterality: N/A;   ESOPHAGOGASTRODUODENOSCOPY (EGD) WITH ESOPHAGEAL DILATION  2010   ESOPHAGOGASTRODUODENOSCOPY (EGD) WITH PROPOFOL N/A 09/12/2018   Procedure: ESOPHAGOGASTRODUODENOSCOPY (EGD) WITH PROPOFOL;  Surgeon: Irene Shipper, MD;  [facility];  Service: Endoscopy;  Laterality: N/A;   ESOPHAGOGASTRODUODENOSCOPY (EGD) WITH PROPOFOL N/A 11/27/2018   Procedure: ESOPHAGOGASTRODUODENOSCOPY (EGD) WITH PROPOFOL, WITH BALLOON DILATION;  Surgeon: Irene Shipper, MD;  [facility];  Service: Endoscopy;  Laterality: N/A;   ESOPHAGOGASTRODUODENOSCOPY (EGD) WITH PROPOFOL N/A 02/20/2020   Procedure: ESOPHAGOGASTRODUODENOSCOPY (EGD) WITH PROPOFOL;  Surgeon: Irene Shipper, MD;  [facility];  Service: Endoscopy;  Laterality: N/A;   ESOPHAGOGASTRODUODENOSCOPY (EGD) WITH PROPOFOL N/A 08/24/2021   Procedure: ESOPHAGOGASTRODUODENOSCOPY (EGD) WITH PROPOFOL;  Surgeon: Irene Shipper, MD;  [facility];  Service: Gastroenterology;  Laterality: N/A;   EXTRACORPOREAL SHOCK WAVE LITHOTRIPSY Right 05/11/2021   Procedure: EXTRACORPOREAL SHOCK WAVE LITHOTRIPSY (ESWL);  Surgeon: [name], MD;  Location[facility];  Service: Urology;  Laterality: Right;   HAND SURGERY     bilateral carpal tunnel releases   JOINT REPLACEMENT     KNEE ARTHROSCOPY Right 2008   LEFT AND RIGHT HEART CATHETERIZATION WITH CORONARY ANGIOGRAM  12/31/2011   Procedure: LEFT  AND RIGHT HEART CATHETERIZATION WITH CORONARY ANGIOGRAM;  Surgeon: Burnell Blanks, MD;  Location: [facility] CATH LAB;  Service: Cardiovascular;;   LEFT AND RIGHT HEART CATHETERIZATION WITH CORONARY ANGIOGRAM N/A 01/14/2014   Procedure: LEFT AND RIGHT HEART CATHETERIZATION WITH CORONARY ANGIOGRAM;  Surgeon: Peter M Martinique, MD;  Location: [facility] CATH LAB;  Service: Cardiovascular;  Laterality: N/A;   MALONEY DILATION  03/01/2017   Procedure: Venia Minks DILATION;  Surgeon: Irene Shipper, MD;  [facility];  Service: Endoscopy;;   PERCUTANEOUS CORONARY INTERVENTION-BALLOON ONLY  01/03/2012   Procedure: PERCUTANEOUS CORONARY INTERVENTION-BALLOON ONLY;  Surgeon: Peter M Martinique, MD;  Location: [facility] CATH LAB;  Service: Cardiovascular;;   PERCUTANEOUS CORONARY STENT INTERVENTION (PCI-S)  12/31/2011   Procedure: PERCUTANEOUS CORONARY STENT INTERVENTION (PCI-S);  Surgeon: Burnell Blanks, MD;  Location: [facility] CATH LAB;  Service: Cardiovascular;;   PERCUTANEOUS CORONARY STENT INTERVENTION (PCI-S) N/A 01/03/2012   Procedure: PERCUTANEOUS CORONARY STENT INTERVENTION (PCI-S);  Surgeon: Peter M Martinique, MD;  Location: [facility] CATH LAB;  Service: Cardiovascular;  Laterality: N/A;   SHOULDER ARTHROSCOPY WITH OPEN ROTATOR CUFF REPAIR AND DISTAL CLAVICLE ACROMINECTOMY Left 02/27/2013   Procedure: LEFT SHOULDER ARTHROSCOPY WITH MINI OPEN ROTATOR CUFF REPAIR AND SUBACROMIAL DECOMPRESSION AND DISTAL CLAVICLE RESECTION;  Surgeon: [name], MD;  Location: [facility];  Service: Orthopedics;  Laterality: Left;   TOTAL KNEE ARTHROPLASTY Right 03/2010   [name]   TRANSURETHRAL RESECTION OF BLADDER TUMOR N/A 03/17/2021   Procedure: TRANSURETHRAL RESECTION OF BLADDER TUMOR (TURBT);  Surgeon: Robley Fries, MD;  [facility];  Service: Urology;  Laterality: N/A;   TRANSURETHRAL RESECTION OF BLADDER TUMOR WITH MITOMYCIN-C N/A 10/08/2020   Procedure: TRANSURETHRAL RESECTION OF BLADDER TUMOR WITH GEMCITABINE;  Surgeon:  Robley Fries, MD;  Location[facility];  Service: Urology;  Laterality: N/A;  75 MINS   TRIGGER FINGER RELEASE Left 02/27/2013   Procedure: RELEASE TRIGGER FINGER/A-1 PULLEY;  Surgeon: [name], MD;  Location: [facility];  Service: Orthopedics;  Laterality: Left;    Family History  Problem Relation Age of Onset   COPD Mother    Heart disease Father    Heart attack Father    Stomach cancer Brother    Stroke Sister    Alcohol abuse Sister    Colon cancer Brother 7   Diabetes Brother    Rectal cancer Neg Hx     Social History   Socioeconomic History   Marital status: Married    Spouse name: Not on file   Number of children: 3   Years of education: Not on file   Highest education level: Not on file  Occupational History   Occupation: Designer, jewellery: RETIRED    Comment: retired  Tobacco Use   Smoking status: Former    Packs/day: 1.00    Years: 20.00    Total pack years: 20.00    Types: Cigarettes    Quit date: 04/06/1963    Years since quitting: 58.9    Passive exposure: Current   Smokeless tobacco: Former  Scientific laboratory technician Use: Never used  Substance and Sexual Activity   Alcohol use: No    Alcohol/week: 0.0 standard drinks of alcohol    Comment: 01/01/2012 "last alcohol ~ 50 yr ago"   Drug use: No   Sexual activity: Not Currently  Other Topics Concern   Not on file  Social History Narrative   No living will   Requests wife as health care POA-- alternate is daughter Hassan Rowan   Discussed DNR --  he requests this (done 08/29/12)   Not sure about feeding tube---but might accept for some time   Patient lives with wife and daughter in a one story home.  Has 3 children.  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      Right Handed    Social Determinants of Health   Financial Resource Strain: Low Risk  (07/25/2020)   Overall Financial Resource Strain (CARDIA)    Difficulty of Paying Living Expenses: Not very hard  Food Insecurity: Not on file   Transportation Needs: Not on file  Physical Activity: Not on file  Stress: Not on file  Social Connections: Not on file  Intimate Partner Violence: Not on file   Review of Systems Sugars okay-- last 128 Does get to sleep but only short periods (under an hour)--spends a long time in bed (discussed sleep compression) Frequent nocturia     Objective:   Physical Exam Constitutional:      Appearance: Normal appearance.  Neurological:     Mental Status: He is alert.  Psychiatric:        Mood and Affect: Mood normal.        Behavior: Behavior normal.            Assessment & Plan:

## 2022-03-12 NOTE — Assessment & Plan Note (Signed)
Mostly from neuropathy Didn't like capsaicin Continues on the lyrica '50mg'$  at bedtime Will increase the hydrocodone back to 10/325 bid (formerly 4 times a day) Discussed trials with TENS unit and alpha lipoic acid

## 2022-03-12 NOTE — Patient Instructions (Signed)
You should try alpha lipoic acid (over the counter)to see if it helps your neuropathy. Some people have had relief with a TENS unit.

## 2022-03-15 DIAGNOSIS — I129 Hypertensive chronic kidney disease with stage 1 through stage 4 chronic kidney disease, or unspecified chronic kidney disease: Secondary | ICD-10-CM | POA: Diagnosis not present

## 2022-03-15 DIAGNOSIS — E1122 Type 2 diabetes mellitus with diabetic chronic kidney disease: Secondary | ICD-10-CM | POA: Diagnosis not present

## 2022-03-15 DIAGNOSIS — K222 Esophageal obstruction: Secondary | ICD-10-CM | POA: Diagnosis not present

## 2022-03-15 DIAGNOSIS — R809 Proteinuria, unspecified: Secondary | ICD-10-CM | POA: Diagnosis not present

## 2022-03-15 DIAGNOSIS — I2510 Atherosclerotic heart disease of native coronary artery without angina pectoris: Secondary | ICD-10-CM | POA: Diagnosis not present

## 2022-03-15 DIAGNOSIS — N184 Chronic kidney disease, stage 4 (severe): Secondary | ICD-10-CM | POA: Diagnosis not present

## 2022-03-15 DIAGNOSIS — N3289 Other specified disorders of bladder: Secondary | ICD-10-CM | POA: Diagnosis not present

## 2022-03-17 DIAGNOSIS — J8410 Pulmonary fibrosis, unspecified: Secondary | ICD-10-CM | POA: Diagnosis not present

## 2022-03-17 DIAGNOSIS — R0689 Other abnormalities of breathing: Secondary | ICD-10-CM | POA: Diagnosis not present

## 2022-03-19 ENCOUNTER — Other Ambulatory Visit: Payer: Self-pay | Admitting: *Deleted

## 2022-03-19 DIAGNOSIS — J849 Interstitial pulmonary disease, unspecified: Secondary | ICD-10-CM

## 2022-03-22 ENCOUNTER — Encounter: Payer: Self-pay | Admitting: Internal Medicine

## 2022-03-22 ENCOUNTER — Ambulatory Visit: Payer: Medicare Other | Admitting: Internal Medicine

## 2022-03-22 ENCOUNTER — Ambulatory Visit (INDEPENDENT_AMBULATORY_CARE_PROVIDER_SITE_OTHER): Payer: Medicare Other | Admitting: Internal Medicine

## 2022-03-22 VITALS — BP 124/66 | HR 89 | Temp 98.6°F | Ht 72.0 in | Wt 195.0 lb

## 2022-03-22 DIAGNOSIS — Z7185 Encounter for immunization safety counseling: Secondary | ICD-10-CM

## 2022-03-22 DIAGNOSIS — J849 Interstitial pulmonary disease, unspecified: Secondary | ICD-10-CM

## 2022-03-22 DIAGNOSIS — R0609 Other forms of dyspnea: Secondary | ICD-10-CM

## 2022-03-22 DIAGNOSIS — J479 Bronchiectasis, uncomplicated: Secondary | ICD-10-CM | POA: Diagnosis not present

## 2022-03-22 LAB — PULMONARY FUNCTION TEST
DL/VA % pred: 114 %
DL/VA: 4.3 ml/min/mmHg/L
DLCO cor % pred: 78 %
DLCO cor: 19.43 ml/min/mmHg
DLCO unc % pred: 78 %
DLCO unc: 19.43 ml/min/mmHg
FEF 25-75 Pre: 0.77 L/sec
FEF2575-%Pred-Pre: 43 %
FEV1-%Pred-Pre: 51 %
FEV1-Pre: 1.43 L
FEV1FVC-%Pred-Pre: 97 %
FEV6-%Pred-Pre: 56 %
FEV6-Pre: 2.1 L
FEV6FVC-%Pred-Pre: 107 %
FVC-%Pred-Pre: 52 %
FVC-Pre: 2.1 L
Pre FEV1/FVC ratio: 68 %
Pre FEV6/FVC Ratio: 100 %

## 2022-03-22 NOTE — Patient Instructions (Addendum)
ICD-10-CM   1. DOE (dyspnea on exertion)  R06.09     2. Bronchiectasis without complication ([place])  F68.1     3. Vaccine counseling  Z71.85       Your shortness of breath is multifactorial and related to physical deconditioning, anemia, kidney disease, low nutritional state and cardiac and lung issues.  Neuro eval non-contributory   Lung function broadly stable over time  Plan Shortness of breath can be very difficult to manage and treat  Cotninue o2 2L [place] at night  Get high-resolution CT chest supine and prone  in 6 months  Continue oxygen use at night and also for subjective reasons in the daytime  Try spiriva respimat 2 puff once daily  Get RESV vaccine commercially  Followup - 6 months or sooner if needed

## 2022-03-22 NOTE — Progress Notes (Signed)
88 yo male former smoker with RA-ILD   TEST     - Coronary artery disease. s/p CABG x 5 in 2002.  Dyspnea started following lopressor Jan-July 2009 and lisinopril increae July 2009. No relief despite stopping agents July 2010  -   Myoview on April 17, 2007 was  nonischemic with an EF of 51% and inferobasal wall scar.   - CAth Oct 2009:    - . Severe native three-vessel coronary artery disease. 2. Status post multivessel coronary bypass surgery with all grafts  patent. Normal LVEF and LVEDP - PFT - Mixed pattern on spiro. Mild restn on lung volumeswith near normal DLCO.   - Fev1 2.2L/73%, FVC 3.21L/70%, Ratio 68 (67),  TLC 4.7L/68%, RV 1.5L/55%, DLCO 79%.  -CPST 05/06/2008: Normal effot.    - Reduced VO2 max 20.5/65%, REduced AT 8.2/40%, Normal breathing reservce of 55%, submaximal heart rate response 112/77%,  flattened O2 pulse response at peak exercise - 51m/beat at 85%. No VQ mismatch abnormalities. - ECHO 09/04/2008: LVH, ef 60%, mild AS and unchanged from prior   - CT chest   - Non specific Interstitial Lung disease NOS. Stable pulm infilratates RLL ggo > LLL ggo. 03/2008 -> 10/2008 -> June 2011: stable - Rehab: never attended 2009-2013 due to cost co.  2013 Walking desat test : resting 98/94% -> 3 laps x 185 feet; HR 113/pulse ox 96%    PFTs July 2013 show worsening restriction since 2010 along with reduced diffusion. The RLL baseline GGO might be worse in July 2013 compared to 2011. In addition, autommune profile shows VERY HIGH TITERS of CCP antibody > 300 which is pathognomonic of rheumtoid arthritis lung involvement. RF is only borderline elevated a 23  PFT 10/12/11>>FEV1 2.02 (70%), Fvc 2.8/72%, TLC 4.07 (59%), DLCO 62%, no BD: RESTRICTION - WORSE since 2010 -(2010:  Fev1 2.2L/73%, FVC 3.21L/70%, Ratio 68 (67),  TLC 4.7L/68%, RV 1.5L/55%, DLCO 79%)   Echo 05/04/11>>mild LVH, EF 55 to 678% grade 1 diastolic dysfx, mild AS, mild MR, PAS 34 mmHg  CT 11/02/11 - calcification  suspicious for aortic valve and some GGO esp RLL > LLL (? RLL worse compared to 2011)   LMinier9/27/13: dLM 80%, LAD 100%, circumflex 100%, RCA 100%, proximal SVG-OM 30%, SVG-D1 ok, SVG-RCA 99% distal and 80% ostial PDA distal to graft, LIMA-LAD ok with some left to right collaterals.  PCI 01/03/12: Promus DES to the SVG-RCA and Cutting Balloon angioplasty to the ostial PDA.  Echocardiogram 12/31/11: EF 529-56% grade 1 diastolic dysfunction, mild aortic stenosis, mean gradient 9, mild LAE.   Walking desaturation test on 05/05/2012 185 feet x 3 laps:  did NOT desaturate. Rest pulse ox was 98%, final pulse ox was 96%. HR response 69/min at rest to 82/min at peak exertion.   PFT FVC fev1 ratio BD fev1 TLC DLCO comment intervention  2010 3.2L/70% 2.2L/73%   4.7L/68% 79%    10/12/11 2.8L/72% 2.02L/70% 72  4.07L/59% 62% Worsening restriction since 2010 Rheum will start immuran/pred. Also had PCI in nov 2013  04/28/12 2.6L/59% 1.77L/62% 67  4.0L/59% 15.7/71%     '2015 PFT showed no significant change with FEV1 56% , ratio 66,  FVC 61%.   07/31/2015 Follow up : ILD ? RA related  Pt returns for follow up . Has been have more DOE.  Was set up for a CT chest  PFT on 4/25 showed FEV1 57%, ratio 71, FVC 58%, no sign BD response , + BD  in midflows.  DLCO 40%. (this is decreased from 2015- was 59%.  TLC is similar to 2015 . Has been started on Imuran for RA ILD .  CT chest did not show any sgin change , cont Bilateral LL GGO and bronchiectasis .  Remains on O2 2l/m  At bedtime  . ONO showed desats on RA , O2 was continued.  Has been seen by [facility] last year. Now on Imuran, feels Arthritis pain is better.  Says he is feels he is doing okay, gets winded with walking long distances.  Can walk 1/2 block before stopping.  Has dry cough some days.  PVX and Prevnar 13 are utd.   Followed by Dr. Estil Daft in Rheum. Remains on Imuran. Most pain is in his hands.   Denies chest pain, orthopnea , edema or fever.    OV  12/09/2015   Chief Complaint  Patient presents with   Follow-up    Feels about the same since the last time we saw him, cough, with some mucus    Follow-up refractory dyspnea in the setting of mild interstitial lung disease from rheumatoid arthritis and coronary artery disease and multifactorial reasons. Refractory to pulmonary rehabilitation.  He did see my nurse practitioner April 2017 and CT chest did not show any changes to his bilateral lower lobe groundglass opacities and bronchiectasis. He is being maintained on Imuran for his rheumatoid arthritis. He is on oxygen 2 L at bedtime.     Had echocardiogram May 2017 that shows elevated pulmonary artery systolic pressure, mild aortic stenosis and diastolic dysfunction. On 08/13/2015 he underwent left heart catheterization by Dr. Sherren Mocha that shows severe native three-vessel coronary artery disease status post bypass with continued patency of the LIMA to LAD and continued patency of the saphenous vein graft to OM with severe stenosis in the proximal body of the graft for which he had successful PCI of a drug-eluting stent treatment of in-stent restenosis. Recommendation is dual antiplatelet therapy lifelong.   He is with his wife currently. He says dyspnea is only some better. He still has significant dyspnea and faitgues more than his wife.   OV 06/07/2016  Chief Complaint  Patient presents with   Follow-up    Pt feels like his breathing has not been doing good, pt c/o coughing more with greyish colored mucus, more increase sob with exertion, has some chest tightnes, does has some congestion Denies fever   Follow-up refractory dyspnea in the setting of rheumatoid arthritis andand coronary artery disease. Multifactorial dyspnea. On Imuran for rheumatoid arthritis oxygen 2 L at bedtime. He is refractory to pulmonary rehabilitation. He is believed to have some interstitial lung disease but April 2017 CT chest suggested only mild  bronchiectasis  Last seen in September 2017 and at that time as dyspnea with some better after having a cardiac stent. I discussed with his cardiologist about the possibility of having right heart catheterization but it was felt that the elevation in pulmonary artery pressure was not significant enough on the echocardiogram to warrant this. He tells me that he is deteriorated now and this shortness of breath is even worse. He has tried Symbicort in the past without much help. He is willing to try "anything" to help his dyspnea. Walking desaturation test on 06/07/2016 185 feet x 3 laps on RA:  did NOT desaturate. Rest pulse ox was 97%, final pulse ox was 95%. Did only 2 laps and stopped in interim x 2. . HR response 64/min at rest  to 70/min at peak exertion. He says he stopped due to dyspnea and chest pain - like always       OV 08/03/2016  Chief Complaint  Patient presents with   Follow-up    Pt here after HRCT. Pt states he tried the trelegy and states it helped his breathing. Pt denies cough, CP/tightness, and f/c/s.     Refractory dyspnea in this gentleman with cardiac disease and also mixed obstructive restrictive spirometry due to air trapping and bronchiectasis and some groundglass.  Last visit I tried him on a sample of triple inhaler therapy. He says that this only worked temporarily and stop working. Nevertheless he wants another inhaler. He is frustrated by his dyspnea. He and his wife are making for relief from dyspnea. In 2016 he visited [facility] and they were considering switching his Imuran to CellCept in order to improve his pulmonary parenchymal problems. He is open to that. However he is not sure he needs follow-up with [facility] because Dr Deeann Dowse left the practice there. The suggestion of mild elevation in pulmonary artery systolic pressures and a previous echocardiogram but it was not significant enough to warrant cardiology to do right heart catheterization. He is  willing to have right heart catheterization if recommended. Overall frustrated. Pulm function test does not show any change in 2 years. And CT scan itself in 2018 appears to be stable    IMPRESSION: 1. Stable mild-to-moderate cylindrical and varicoid bronchiectasis in the bilateral dependent lower lobes. 2. Continued stability of parenchymal banding with associated subpleural reticulation and ground-glass attenuation at the areas of bronchiectasis in the dependent and basilar lower lobes, most consistent with postinfectious/postinflammatory scarring. No findings to suggest interstitial lung disease. 3. Stable smooth pleural thickening and calcification in the dependent basilar right pleural space, most compatible with chronic postinflammatory change. No pleural effusions. 4. Stable mild patchy air trapping in both lungs, suggesting small airways disease. 5. Aortic atherosclerosis. Left main and 3 vessel coronary atherosclerosis status post CABG.      OV 01/31/2017  Chief Complaint  Patient presents with   Follow-up    Pt states that he has been sick x5 months with nausea and vomiting. C/o SOB with exertion, prod. cough with gray mucus, and occ. CP when SOB.    88 year old male with rheumatoid arthritis and interstitial lung disease. He has refractory dyspnea class III on exertion relieved by rest. He also has associated exertional chest pain.at last visit I had his rheumatologist Dr. D evaluate him in switching his Imuran to CellCeptwhich she did in 11/18/2016. He is on low-dose methotrexate was reduced GFR. He is tolerating the medication well. He complains of nausea and vomiting but this preceded the CellCept and Bactrim by 2 months.he is scheduled for an endoscopy in November 2018. He does not change because of the CellCept and Bactrim.Most recent lab 12/26/2016 fairly stable except mild reduction in hemoglobin. Dr. Keturah Barre has referred him back to primary care physician.he continues to be  overwhelmed by his dyspnea and also associated chest pain with exertion. Was recently saw cardiology and had a stable echocardiogram and EKG according to his history. Cardiologist reassured him in his view of the chest pain is not associated with cardiac disease. He therefore is insisting to me that this is because of lung disease. Walking desaturation test 185 feet 3 laps on room air with a forehead probe: Resting pulse ox 99%. Final pulse ox 98%. Resting heart rate 80/m. Final heart rate 98 a minute. He  did get dyspneic with chest pain as always. This no change in his oxygen desaturation test.\  The only changes on the last 3 weeks is complaining of increased cough congestion in his chestand a grayish sputum that is different from from baseline.   OV 04/29/2017  Chief Complaint  Patient presents with   Follow-up    Pt states his SOB has become worse and coughing with gray mucus. Pt states abx he was placed on last visit did not clear up symptoms. Cellcept and bactrim were both stopped Tuesday, 04/26/17 pt believes.   Follow-up multifactorial refractory dyspnea in the setting of interstitial lung disease related to rheumatoid arthritis  He has been on CellCept for a while associated with Bactrim.  Earlier this week he saw a rheumatologist Dr. Keturah Barre.  He complained of significant nausea.  She called me we spoke.  Because of lack of improvement with dyspnea with the CellCept regimen recommended by [facility].  We decided to discontinue this.  At the same time was having nausea and there was concern this was related to CellCept/Bactrim.  Today he tells me that since stopping CellCept/Bactrim nausea and vomiting have significantly improved.  He still feels fatigued.  His cardiologist now has it on a monitor because of his refractory dyspnea.  He is frustrated by his dyspnea which he says is exertional and associated with central chest pain.  There are no other new issues.  Recent review of the labs show  that he has been anemic early in January 2019 and his chronic kidney disease is getting worse.  The status of these as of today is not known.  Next blood work is only in a month from now/   Walking desaturation test on 04/29/2017 185 feet x 3 laps on ROOM AIR:  did NOT desaturate. Rest pulse ox was 98%, final pulse ox was 95%. HR response 69/min at rest to 94/min at peak exertion. Patient [name]  Did not Desaturate < 88% . [name] yes did  Desaturated </= 3% points. [name] yes did get tachyardic. DID get dyspneic with central chest pain - just like before   OV 08/01/2017    Chief Complaint  Patient presents with   Follow-up    Pt states he is the same as he was at the last visit, maybe some better. Pt still becomes SOB with exertion, some mild coughing with gray mucus, and occ. chest tightness.   Follow-up multifactorial refractory dyspnea in the setting of interstitial lung disease related to rheumatoid arthritis   88 year old male with rheumatoid arthritis interstitial lung disease and also severe coronary artery disease on medical management.  He is here for follow-up.  There is a 30-monthfollow-up.  In the interim his anemia [facility].  His exertional chest pain is somewhat better and his dyspnea somewhat better but he still does have baseline exertional dyspnea and chest pain that is refractory to any treatment.  He is no longer on CellCept or Bactrim for his interstitial lung disease after developing nausea.  He has been off immune modulator treatment for 3 months.  He did see Dr. D rheumatologist end of March 2019 and she was wondering about restarting his old Imuran.  At this point in time he is still going through anemia work-up.  He has a colonoscopy upcoming.  He will have Plavix on hold prior to the colonoscopy.  At  this point in time his walking desaturation test is stable and documented  below.  There are no other new issues.  Walking desaturation test on 08/01/2017 185 feet x 3 laps on ROOM AIR:  did not desaturate. Rest pulse ox was 98%, final pulse ox was 98%. HR response 67/min at rest to 86/min at peak exertion. Patient JAMEY HARMAN  Did not Desaturate < 88% . [name] did not  Desaturated </= 3% points. [name] did not get tachyardic.  DID get dyspneic with central chest pain - just like before   Results for ETHELBERT, [name] (MRN [id number]) as of 08/01/2017 11:25  Ref. Range 06/24/2017 11:36  Creatinine Latest Ref Range: 0.70 - 1.11 mg/dL 2.28 (H)    Results for [name], [name] (MRN [id number]) as of 08/01/2017 11:25  Ref. Range 06/24/2017 11:36  WBC Latest Ref Range: 3.8 - 10.8 Thousand/uL 7.8   OV 01/17/2019  Subjective:  Patient ID: [name], male , DOB: 28-Jan-1932 , age 73 y.o. , MRN: [id number] , ADDRESS: [address] [place] [address] Follow-up multifactorial refractory dyspnea in the setting of interstitial lung disease related to rheumatoid arthritis  01/17/2019 -   Chief Complaint  Patient presents with   ILD (interstitial lung disease)    Feels breathing has not improved. Will take a couple minutes for breathing to get back to normal if he gets short of breath for the last six months.     HPI [name] 88 y.o. -follow-up multifactorial dyspnea setting of  interstitial lung disease secondary to rheumatoid arthritis.  Also refractory dyspnea.  Last seen in April 2019.  Since then have not seen him.  At the last visit we recommended he go back on Imuran but review of the rheumatology notes from January 10, 2019 looks like he is not on any immunosuppressive's.  This because he said previous side effects of the GI tract from both Imuran and CellCept.  With the onset of the pandemic he said he is not eating at restaurants anymore and therefore he had and his wife have lost a lot of weight.  Despite this loss of weight his  shortness of breath he says is worse.  Of note every visit he comes he has progressive dyspnea.  The symptom score itself shows the dyspnea is not bad.  He continues to use 2 L of nasal cannula oxygen at night.  Last CT scan of the chest was in 2018 and last PFT was in 2017.      SYMPTOM SCALE - ILD 01/17/2019   O2 use  only 2 L at night  Shortness of Breath 0 -> 5 scale with 5 being worst (score 6 If unable to do)  At rest 0  Simple tasks - showers, clothes change, eating, shaving 2.5  Household (dishes, doing bed, laundry) 3  Shopping 2  Walking level at own pace 2  Walking keeping up with others of same age 38.5  Walking up Stairs 4  Walking up Hill 4  Total (40 - 48) Dyspnea Score 21  How bad is your cough? 0  How bad is your fatigue 0    Results for [name], [name] (MRN [id number]) as of 01/17/2019 12:01  Ref. Range 09/18/2013 11:28 07/29/2015 11:12  FVC-Pre Latest Units: L 2.57 2.52  FVC-%Pred-Pre Latest Units: % 61 56    Results for [name], [name] (MRN [id number]) as of 01/17/2019 12:01  Ref. Range 09/18/2013 11:28 07/29/2015  11:12  DLCO unc Latest Units: ml/min/mmHg 19.91 14.87  DLCO unc % pred Latest Units: % 59 40  IMPRESSION: 1. Stable mild-to-moderate cylindrical and varicoid bronchiectasis in the bilateral dependent lower lobes. 2. Continued stability of parenchymal banding with associated subpleural reticulation and ground-glass attenuation at the areas of bronchiectasis in the dependent and basilar lower lobes, most consistent with postinfectious/postinflammatory scarring. No findings to suggest interstitial lung disease. 3. Stable smooth pleural thickening and calcification in the dependent basilar right pleural space, most compatible with chronic postinflammatory change. No pleural effusions. 4. Stable mild patchy air trapping in both lungs, suggesting small airways disease. 5. Aortic atherosclerosis. Left main and 3 vessel coronary atherosclerosis status  post CABG.     Electronically Signed   By: Ilona Sorrel M.D.   On: 07/08/2016 14:11  IMPRESSION: 1. Stable mild-to-moderate cylindrical and varicoid bronchiectasis in the bilateral dependent lower lobes. 2. Continued stability of parenchymal banding with associated subpleural reticulation and ground-glass attenuation at the areas of bronchiectasis in the dependent and basilar lower lobes, most consistent with postinfectious/postinflammatory scarring. No findings to suggest interstitial lung disease. 3. Stable smooth pleural thickening and calcification in the dependent basilar right pleural space, most compatible with chronic postinflammatory change. No pleural effusions. 4. Stable mild patchy air trapping in both lungs, suggesting small airways disease. 5. Aortic atherosclerosis. Left main and 3 vessel coronary atherosclerosis status post CABG.     Electronically Signed   By: Ilona Sorrel M.D.   On: 07/08/2016 14:11  ROS - per HPI  Results for [name], [name] (MRN [id number]) as of 01/17/2019 12:01  Ref. Range 04/28/2018 12:28  Hemoglobin Latest Ref Range: 13.0 - 17.0 g/dL 15.1   Results for [name], [name] (MRN [id number]) as of 01/17/2019 12:01  Ref. Range 04/28/2018 12:28  Creatinine Latest Ref Range: 0.40 - 1.50 mg/dL 2.24 (H)   2020 -   Chief Complaint  Patient presents with   Follow-up    PFT performed 10/26 and CT performed 11/3.  Pt states that his breathing is the same as last visit and is still become SOB with activities.   Follow-up multifactorial dyspnea in the setting of rheumatoid arthritis associated with mild interstitial lung disease v cylindrical bronchiectasis and air trapping  HPI [name] 88 y.o. -last visit was approximately 3 weeks ago.  Given his worsening shortness of breath I asked him to do a high-resolution CT chest and also simple walking desaturation test this visit and also pulmonary function test.  He is here to review that.  He is  here with his wife.  He now tells me that his shortness of breath is not as bad as he told me it was at the last visit.  Nevertheless it is present on and off with exertion but relieved by rest.  His pulmonary function test shows continued stability over the last 3 years.  His high-resolution CT chest that I personally visualized shows very mild ILD and some air trapping.  He says inhalers have not helped him in the past.  There are no other new issues.  He continues to maintain a lean body weight.  He uses 2 L of oxygen at night.  He is not interested in any more testing at this point.       xxxxxxxx[id number] CLINICAL DATA:  Chronic shortness of breath. Evaluate for interstitial lung disease.   EXAM: CT CHEST WITHOUT CONTRAST   TECHNIQUE: Multidetector CT imaging of the chest was performed following the standard  protocol without intravenous contrast. High resolution imaging of the lungs, as well as inspiratory and expiratory imaging, was performed.   COMPARISON:  07/08/2016 and 07/08/2015.   FINDINGS: Cardiovascular: Atherosclerotic calcification of the aorta and aortic valve. Heart size normal. No pericardial effusion.   Mediastinum/Nodes: No pathologically enlarged mediastinal or axillary lymph nodes. Hilar regions are difficult to definitively evaluate without IV contrast. Esophagus is grossly unremarkable.   Lungs/Pleura: Linear scarring in the subpleural posterior right upper lobe. Mild cylindrical bronchiectasis. Negative for subpleural reticulation, traction bronchiectasis/bronchiolectasis, ground-glass, architectural distortion or honeycombing. Areas of subpleural scarring in the right lower lobe. Probable tiny calcified granuloma in the left lower lobe. There is air trapping. No pleural fluid. Pleural calcifications in the posterior right hemithorax. Debris is seen dependently in the upper trachea. Airway is otherwise unremarkable.   Upper  Abdomen: Visualized portions of the liver, gallbladder and right adrenal gland are unremarkable. Low-attenuation lesion off the upper pole left kidney measures 4.3 cm, incompletely imaged. Visualized portions of the spleen, pancreas, stomach and bowel are grossly unremarkable. Cholecystectomy. No upper abdominal adenopathy.   Musculoskeletal: Degenerative changes in the spine. No worrisome lytic or sclerotic lesions. Flowing anterior osteophytosis in the thoracic spine.   IMPRESSION: 1. Cylindrical bronchiectasis without evidence of fibrotic interstitial lung disease. 2. Air trapping is indicative of small airways disease. 3.  Aortic atherosclerosis (ICD10-170.0).     Electronically Signed   By: Lorin Picket M.D.   On: 02/06/2019 16:00   ROS - per HPI    OV 02/26/2020  Subjective:  Patient ID: [name], male , DOB: [birth year] , age 46 y.o. , MRN: [id number] , ADDRESS: [address] [place] [address] PCP [name], MD Patient Care Team: [name], MD as PCP - General [name], MD as PCP - Cardiology (Cardiology) [name], [facility] as Pharmacist (Pharmacist)  This Provider for this visit: Treatment Team:  Attending Provider: Brand Males, MD    02/26/2020 -   Chief Complaint  Patient presents with   Follow-up    ILD, SOB still the same    Follow-up rheumatoid arthritis with mixed restrictive/obstructive lung function slightly low DLCO and significant shortness of breath  -High-resolution CT chest mild ILD (also has velcro crackles at lung base) with air trapping according to Dr. Chase Caller indeterminate/alternative pattern   -Cylindrical bronchiectasis without evidence of fibrotic lung disease per radiology  Out of proportion dyspnea   HPI BURNETTE SAUTTER 88 y.o. - -presents for 1 year follow-up.  He tells me that I have grown old along with him in the last 10 years have been following him.  He is now 88 years  old.  He is maintaining his weight.  He continues to have out of proportion dyspnea but he admits is not any worse.  His last pulmonary function test and CT scan was a year ago and it shows stability.  There are no new issues.  He has seen rheumatologist who is then advised that he see me as well.  He continues his oxygen at night.  His symptom score shows stability.  His walking desaturation test shows stability.  He did get a bit dyspneic.  I did note the symptom score is not fully reliable.  He plans to get his Covid booster vaccine.    IMPRESSION: 1. Cylindrical bronchiectasis without evidence of fibrotic interstitial lung disease. 2. Air trapping is indicative of small airways disease. 3.  Aortic atherosclerosis (ICD10-170.0).  Electronically Signed   By: Lorin Picket M.D.   On: 02/06/2019 16:00   ROS  OV 02/20/2021  Subjective:  Patient ID: GATSBY CHISMAR, male , DOB: 1931/11/04 , age 82 y.o. , MRN: [id number] , ADDRESS: [address] [place] [address] PCP [name], MD Patient Care Team: [name], MD as PCP - General [name], MD as PCP - Cardiology (Cardiology) [name], [facility] as Pharmacist (Pharmacist)  This Provider for this visit: Treatment Team:  Attending Provider: Brand Males, MD    02/20/2021 -   Chief Complaint  Patient presents with   Follow-up    Pt states his breathing has gotten worse since last visit. States that he has an occ cough.      HPI IOAN [name] 88 y.o. -presents for follow-up.  He presents with his wife and daughter.  Meeting daughter I think for the first time or after a long time.  He again tells me that as always that his dyspnea is getting worse.  He is also more physically deconditioned.  Daughter states he is mostly sedentary does not do any household work.  He does things around the house.  They all agree that the dyspnea is definitely worse.  Daughter wondered if any of the  medicines can directly contribute to dyspnea.  I reviewed the medications patient does not have Brilinta but on Plavix.  He is on antihypertensives.  He review of his medical chart indicates that he has diastolic dysfunction, arctic stenosis, anemia, chronic kidney disease - -all of which can contribute to dyspnea.  He also has anemia.  In addition he is more progressively physically deconditioned his albumin is now less than 4 and he is on protein calorie malnutrition.  He when he walks around the house he uses oxygen for relief but he never desaturates.  This for subjectively.  He continues with nighttime oxygen.  I explained that sometimes for palliative relief of dyspnea morphine would be indicated.  They are not enthused about this option.  At this point in time we will get more work-up done.  Did explain to the multifactorial nature of dyspnea can be hard to solve.    12/09/2021 - Interim hx  Patient present today for overdue follow-up.  Patient has longstanding history of progressive shortness of breath.  Most recent CT of chest in November 2022 showed no evidence of ILD, however, there was significant bronchiectasis.  During his last visit acetylcholine receptor antibody was mildly positive at 28%.  He was referred to neurology.  He saw Dr. Posey Pronto with Neurology, symptoms were not felt to be classic presentation for myasthenia. Antibody felt to be possibly more of a systemic inflammatory response d.t hx RA. It was recommended that he have NCS/EMG testing but patient declined. Neurology recommended empiric trial mestinon 60m 1 hour prior to meals and follow up in 2 weeks after new start of medication. Appears he either did not tolerate medication or saw no improvement while taking. Unclear how long he tried medication. Shortness of breath is unchanged. No worsening symptoms. Low energy. He clears his throat several times throughout the day. Sputum is not purulent that he is aware of. No recent issues  with swallowing.  He walks his drive way twice a day approx 400 steps, checks his oxygen with exertion and O2 remains round 95% on room air. He wears oxygen at night. Denies f/c/s, chest tightness, wheezing.  Imaging: 03/04/21 HRCT>> Chronic cylindrical bronchiectasis and  mild scarring in the lungs, similar to the prior study. No evidence of interstitial lung disease. Mild air trapping indicative of mild small airways disease. Right pleural thickening calcification, presumably related to remote right-sided pleural hemorrhage and/or pleural infection. Aortic atherosclerosis, in addition to left main and 3 vessel coronary artery disease. There are extremely severe calcifications of the aortic valve   OV 03/22/2022  Subjective:  Patient ID: [name], male , DOB: 1932/03/08 , age 89 y.o. , MRN: [id number] , AD[address]ESS: [address] [place] [address] PCP [name], MD Patient Care Team: [name], MD as PCP - General [name], MD as PCP - Cardiology (Cardiology) [name], [facility] as Pharmacist (Pharmacist) Alda Berthold, DO as Consulting Physician (Neurology)  This Provider for this visit: Treatment Team:  Attending Provider: Brand Males, MD    03/22/2022 -   Chief Complaint  Patient presents with   Follow-up    Follow up for bronchiectasis. Pt states that he has been doing well since last office visit. PFT done today. Pt states that he has issues with the Stiolto resp due to his arthritis. But states he felt no difference with the inhaler. Was placed on Bevspi but felt no difference on the inhaler   Follow-up rheumatoid arthritis with mixed restrictive/obstructive lung function slightly low DLCO and significant shortness of breath  -High-resolution CT chest mild ILD (also has velcro crackles at lung base) with air trapping according to Dr. Chase Caller indeterminate/alternative pattern   -Cylindrical bronchiectasis without evidence of  fibrotic lung disease per radiology  Out of proportion dyspnea  HPI [name] 88 y.o. -returns for follow-up.  I personally saw him a year ago after that he saw a nurse practitioner.  He did not have his follow-up high-resolution CT chest as advised.  He had pulmonary function test that shows stable DLCO with the FVC is down.  I do not know what to make of that.  Last visit I did check for acetylcholine receptor antibody which was positive.  So neurology.  He refused EMG nerve conduction studies.  He took empiric Mestinon according to the daughter it did not help him.  He continues to be shortness of breath.  He is stable.  Walking desaturation test also stable.  But he is still quite symptomatic and overall has fatigue.  He still has his crackles.  He has not had his RSV vaccine.  I counseled him about this.     SYMPTOM SCALE - ILD 01/17/2019  02/26/2020 197# 02/20/2021 191# 03/22/2022 195#  O2 use  only 2 L at night At night    Shortness of Breath 0 -> 5 scale with 5 being worst (score 6 If unable to do)     At rest 0 0 0 3  Simple tasks - showers, clothes change, eating, shaving 2.5 0 3 1  Household (dishes, doing bed, laundry) 3 0 na na  Shopping 2 0 na na  Walking level at own pace 2 0 3 3  Walking keeping up with others of same age 21.5 Does not do Does not do na  Walking up Stairs 4 Does not do Does not do ha  Walking up [place] 4 Does not do Does notot na  Total (40 - 48) Dyspnea Score _0 How bad is your cough? 0 none 0 0  How bad is your fatigue 0 none 2 5  nausea   0 0  vomit  0 0  diarreh   0 00  anxiety   0   depression   0 0  0   Simple office walk 185 feet x  3 laps goal with forehead probe 02/26/2020  02/20/2021  03/22/2022   O2 used ra ra ra  Number laps completed 3 Did only 1.5 of intended 3 laps Did only 1 of 3 laps  Comments about pace slow slow slow  Resting Pulse Ox/HR 97% and 62/min 100% and 67 99% and HR 89  Final Pulse Ox/HR 95% and 80/min  98% and 74 95% and HR 93  Desaturated </= 88% no no no  Desaturated <= 3% points no no Ues 4  Got Tachycardic >/= 90/min no no yes  Symptoms at end of test Dyspnea, 2nd lap Moderate dyspnea and stopped Dyspneia +  Miscellaneous comments x        PFT     Latest Ref Rng & Units 03/22/2022   11:50 AM 01/29/2019    1:35 PM 07/29/2015   11:12 AM 09/18/2013   11:28 AM  PFT Results  FVC-Pre L 2.10  P 2.46  2.52  2.57   FVC-Predicted Pre % 52  P 59  56  61   FVC-Post L  2.51  2.60  2.60   FVC-Predicted Post %  60  58  61   Pre FEV1/FVC % % 68  P 67  66  66   Post FEV1/FCV % %  69  71  70   FEV1-Pre L 1.43  P 1.64  1.67  1.69   FEV1-Predicted Pre % 51  P 56  52  56   FEV1-Post L  1.74  1.84  1.81   DLCO uncorrected ml/min/mmHg 19.43  P 18.48  14.87  19.91   DLCO UNC% % 78  P 73  40  59   DLCO corrected ml/min/mmHg 19.43  P   19.91   DLCO COR %Predicted % 78  P   59   DLVA Predicted % 114  P 118  77  107   TLC L  5.48  4.81  4.70   TLC % Predicted %  73  62  64   RV % Predicted %  104  77  72     P Preliminary result       has a past medical history of Aortic stenosis, Arthritis, CAD (coronary artery disease), Cancer ([place]), Cataract, Chronic diastolic CHF (congestive heart failure) ([place]), Chronic kidney disease, stage III (moderate) ([name]), Chronic lower back pain, Colon polyps, COVID-19, Diverticulosis, Dyspnea (2009 since July -Sept), Enlarged prostate, Esophageal stricture, GERD (gastroesophageal reflux disease), Heart murmur, Hiatal hernia, History of carpal tunnel syndrome, History of kidney stones, History of PFTs, Hyperlipidemia, Hypertension, Interstitial lung disease ([place]), Iron deficiency anemia, Myocardial infarction ([place]), Nausea & vomiting, On home oxygen therapy, Osteoporosis, Overweight (BMI 25.0-29.9), Peripheral neuropathy, RA (rheumatoid arthritis) ([place]), Seropositive rheumatoid arthritis ([place]), Type II diabetes mellitus ([place]), Wears glasses, and Wears partial  dentures.   reports that he quit smoking about 59 years ago. His smoking use included cigarettes. He has a 20.00 pack-year smoking history. He has been exposed to tobacco smoke. He has quit using smokeless tobacco.  Past Surgical History:  Procedure Laterality Date   BALLOON DILATION N/A 09/12/2018   Procedure: BALLOON DILATION;  Surgeon: Irene Shipper, MD;  [facility]: [facility];  Service: Endoscopy;  Laterality: N/A;   BALLOON DILATION N/A 02/20/2020   Procedure: BALLOON DILATION;  Surgeon: [name]  N, MD;  [facility];  Service: Endoscopy;  Laterality: N/A;   BALLOON DILATION N/A 08/24/2021   Procedure: BALLOON DILATION;  Surgeon: Irene Shipper, MD;  [facility];  Service: Gastroenterology;  Laterality: N/A;   BOTOX INJECTION N/A 09/12/2018   Procedure: BOTOX INJECTION;  Surgeon: Irene Shipper, MD;  [facility];  Service: Endoscopy;  Laterality: N/A;   BOTOX INJECTION N/A 11/27/2018   Procedure: BOTOX INJECTION;  Surgeon: Irene Shipper, MD;  [facility];  Service: Endoscopy;  Laterality: N/A;   BOTOX INJECTION N/A 02/20/2020   Procedure: BOTOX INJECTION;  Surgeon: Irene Shipper, MD;  [facility];  Service: Endoscopy;  Laterality: N/A;   BOTOX INJECTION  08/24/2021   Procedure: BOTOX INJECTION;  Surgeon: Irene Shipper, MD;  [facility];  Service: Gastroenterology;;   CARDIAC CATHETERIZATION  08/2004   CP- no MI, Cath- small vessell disease    CARDIAC CATHETERIZATION  12/31/2011   80% distal LM, 100% native LAD, LCx and RCA, 30% prox SVG-OM, SVG-D1 normal, 99% distal, 80% ostial SVG-RCA distal to graft, LIMA-LAD normal; LVEF mildly decreased with posterior basal AK    CARDIAC CATHETERIZATION  2009   with patent grafts/notes 12/31/2011   CARDIAC CATHETERIZATION N/A 08/13/2015   Procedure: Left Heart Cath and Cors/Grafts Angiography;  Surgeon: Sherren Mocha, MD;  Location: [facility];  Service: Cardiovascular;  Laterality:  N/A;   CATARACT EXTRACTION W/ INTRAOCULAR LENS  IMPLANT, BILATERAL Bilateral    CHOLECYSTECTOMY OPEN  11/2003   [name]   CORONARY ANGIOPLASTY WITH STENT PLACEMENT  01/03/2012   Successful DES to SVG-RCA and cutting balloon angioplasty ostial  PDA    CORONARY ANGIOPLASTY WITH STENT PLACEMENT  01/14/2014   "1"   CORONARY ARTERY BYPASS GRAFT  11/1999   CABG X5   CORONARY STENT PLACEMENT  02/2012   1 stent and balloon   CYSTOSCOPY WITH BIOPSY N/A 03/17/2021   Procedure: DIAGNOSTIC CYSTOSCOPY WITH BIOPSY;  Surgeon: Robley Fries, MD;  [facility];  Service: Urology;  Laterality: N/A;  1 HR   CYSTOSCOPY WITH RETROGRADE PYELOGRAM, URETEROSCOPY AND STENT PLACEMENT Right 07/28/2021   Procedure: CYSTOSCOPY WITH FULGERATION, STENT PLACEMENT, RETROGRADE PYELOGRAM,  DIAGNOSTIC URETEROSCOPY;  Surgeon: Robley Fries, MD;  [facility];  Service: Urology;  Laterality: Right;   ESOPHAGEAL DILATION  11/27/2018   Procedure: ESOPHAGEAL DILATION;  Surgeon: Irene Shipper, MD;  [facility];  Service: Endoscopy;;   ESOPHAGOGASTRODUODENOSCOPY N/A 03/01/2017   Procedure: ESOPHAGOGASTRODUODENOSCOPY (EGD);  Surgeon: Irene Shipper, MD;  [facility];  Service: Endoscopy;  Laterality: N/A;   ESOPHAGOGASTRODUODENOSCOPY (EGD) WITH ESOPHAGEAL DILATION  2010   ESOPHAGOGASTRODUODENOSCOPY (EGD) WITH PROPOFOL N/A 09/12/2018   Procedure: ESOPHAGOGASTRODUODENOSCOPY (EGD) WITH PROPOFOL;  Surgeon: Irene Shipper, MD;  [facility];  Service: Endoscopy;  Laterality: N/A;   ESOPHAGOGASTRODUODENOSCOPY (EGD) WITH PROPOFOL N/A 11/27/2018   Procedure: ESOPHAGOGASTRODUODENOSCOPY (EGD) WITH PROPOFOL, WITH BALLOON DILATION;  Surgeon: Irene Shipper, MD;  [facility];  Service: Endoscopy;  Laterality: N/A;   ESOPHAGOGASTRODUODENOSCOPY (EGD) WITH PROPOFOL N/A 02/20/2020   Procedure: ESOPHAGOGASTRODUODENOSCOPY (EGD) WITH PROPOFOL;  Surgeon: Irene Shipper, MD;  [facility];  Service:  Endoscopy;  Laterality: N/A;   ESOPHAGOGASTRODUODENOSCOPY (EGD) WITH PROPOFOL N/A 08/24/2021   Procedure: ESOPHAGOGASTRODUODENOSCOPY (EGD) WITH PROPOFOL;  Surgeon: Irene Shipper, MD;  [facility];  Service: Gastroenterology;  Laterality: N/A;   EXTRACORPOREAL SHOCK WAVE LITHOTRIPSY Right 05/11/2021   Procedure: EXTRACORPOREAL SHOCK  WAVE LITHOTRIPSY (ESWL);  Surgeon: [name], MD;  Location: [facility];  Service: Urology;  Laterality: Right;   HAND SURGERY     bilateral carpal tunnel releases   JOINT REPLACEMENT     KNEE ARTHROSCOPY Right 2008   LEFT AND RIGHT HEART CATHETERIZATION WITH CORONARY ANGIOGRAM  12/31/2011   Procedure: LEFT AND RIGHT HEART CATHETERIZATION WITH CORONARY ANGIOGRAM;  Surgeon: Burnell Blanks, MD;  [facility] C[facility] LAB;  Service: Cardiovascular;;   LEFT AND RIGHT HEART CATHETERIZATION WITH CORONARY ANGIOGRAM N/A 01/14/2014   Procedure: LEFT AND RIGHT HEART CATHETERIZATION WITH CORONARY ANGIOGRAM;  Surgeon: Peter M Martinique, MD;  Location: [facility] CATH LAB;  Service: Cardiovascular;  Laterality: N/A;   MALONEY DILATION  03/01/2017   Procedure: Venia Minks DILATION;  Surgeon: Irene Shipper, MD;  [facility];  Service: Endoscopy;;   PERCUTANEOUS CORONARY INTERVENTION-BALLOON ONLY  01/03/2012   Procedure: PERCUTANEOUS CORONARY INTERVENTION-BALLOON ONLY;  Surgeon: Peter M Martinique, MD;  Location: [facility] CATH LAB;  Service: Cardiovascular;;   PERCUTANEOUS CORONARY STENT INTERVENTION (PCI-S)  12/31/2011   Procedure: PERCUTANEOUS CORONARY STENT INTERVENTION (PCI-S);  Surgeon: Burnell Blanks, MD;  Location: [facility] CATH LAB;  Service: Cardiovascular;;   PERCUTANEOUS CORONARY STENT INTERVENTION (PCI-S) N/A 01/03/2012   Procedure: PERCUTANEOUS CORONARY STENT INTERVENTION (PCI-S);  Surgeon: Peter M Martinique, MD;  Location: [facility] CATH LAB;  Service: Cardiovascular;  Laterality: N/A;   SHOULDER ARTHROSCOPY WITH OPEN ROTATOR CUFF REPAIR AND DISTAL CLAVICLE  ACROMINECTOMY Left 02/27/2013   Procedure: LEFT SHOULDER ARTHROSCOPY WITH MINI OPEN ROTATOR CUFF REPAIR AND SUBACROMIAL DECOMPRESSION AND DISTAL CLAVICLE RESECTION;  Surgeon: [name], MD;  Location: [facility];  Service: Orthopedics;  Laterality: Left;   TOTAL KNEE ARTHROPLASTY Right 03/2010   [name]   TRANSURETHRAL RESECTION OF BLADDER TUMOR N/A 03/17/2021   Procedure: TRANSURETHRAL RESECTION OF BLADDER TUMOR (TURBT);  Surgeon: Robley Fries, MD;  [facility];  Service: Urology;  Laterality: N/A;   TRANSURETHRAL RESECTION OF BLADDER TUMOR WITH MITOMYCIN-C N/A 10/08/2020   Procedure: TRANSURETHRAL RESECTION OF BLADDER TUMOR WITH GEMCITABINE;  Surgeon: Robley Fries, MD;  [facility];  Service: Urology;  Laterality: N/A;  75 MINS   TRIGGER FINGER RELEASE Left 02/27/2013   Procedure: RELEASE TRIGGER FINGER/A-1 PULLEY;  Surgeon: [name], MD;  Location: [facility];  Service: Orthopedics;  Laterality: Left;    Allergies  Allergen Reactions   Metronidazole     Kidney Dr does not want pt to take    Doxazosin Mesylate Other (See Comments)    dizziness   Methocarbamol Rash    Immunization History  Administered Date(s) Administered   Fluad Quad(high Dose 65+) 12/26/2020, 12/11/2021   H1N1 03/27/2008   Influenza Split 12/29/2010, 01/18/2012   Influenza Whole 02/03/2007, 01/09/2008, 01/01/2009, 12/31/2009   Influenza,inj,Quad PF,6+ Mos 12/13/2012, 01/15/2014, 01/15/2015, 01/06/2016, 01/03/2017, 02/09/2018, 01/30/2019, 01/05/2020   PFIZER(Purple Top)SARS-COV-2 Vaccination 04/26/2019, 05/17/2019   Pneumococcal Conjugate-13 09/10/2013   Pneumococcal Polysaccharide-23 03/05/1997, 08/17/2011   Td 03/05/1997, 08/17/2011    Family History  Problem Relation Age of Onset   COPD Mother    Heart disease Father    Heart attack Father    Stomach cancer Brother    Stroke Sister    Alcohol abuse Sister    Colon cancer Brother 25   Diabetes Brother    Rectal cancer Neg  Hx      Current Outpatient Medications:    acetaminophen (TYLENOL) 650 MG CR tablet, Take 650 mg by mouth  every 6 (six) hours as needed for pain., Disp: , Rfl:    amLODipine (NORVASC) 5 MG tablet, Take 5 mg by mouth at bedtime., Disp: , Rfl:    aspirin EC 81 MG tablet, Take 81 mg by mouth at bedtime., Disp: , Rfl:    Blood Glucose Monitoring Suppl (ONE TOUCH ULTRA 2) w/Device KIT, Use to obtain blood sugar daily. Dx Code E11.40, Disp: 1 kit, Rfl: 0   clopidogrel (PLAVIX) 75 MG tablet, TAKE 1 TABLET BY MOUTH ONCE A DAY, Disp: 90 tablet, Rfl: 3   Ferrous Sulfate 28 MG TABS, Take 28 mg by mouth 2 (two) times a week., Disp: , Rfl:    finasteride (PROSCAR) 5 MG tablet, Take 5 mg by mouth daily., Disp: , Rfl:    fluticasone (CUTIVATE) 0.05 % cream, Apply 1 application. topically daily as needed (irritation)., Disp: , Rfl:    glipiZIDE (GLUCOTROL) 5 MG tablet, Take 1 tablet (5 mg total) by mouth 2 (two) times daily before a meal., Disp: 90 tablet, Rfl: 3   glucose blood (ONE TOUCH ULTRA TEST) test strip, USE TO CHECK BLOOD SUGAR ONCE A DAY Dx Code E11.40, Disp: 100 each, Rfl: 3   HYDROcodone-acetaminophen (NORCO) 10-325 MG tablet, Take 1 tablet by mouth in the morning and at bedtime., Disp: 60 tablet, Rfl: 0   ketoconazole (NIZORAL) 2 % shampoo, Apply 1 application. topically 2 (two) times a week., Disp: , Rfl:    losartan (COZAAR) 25 MG tablet, Take 50 mg by mouth daily., Disp: , Rfl:    Multiple Vitamins-Minerals (ICAPS AREDS 2 PO), Take 1 capsule by mouth daily., Disp: , Rfl:    nitroGLYCERIN (NITROSTAT) 0.4 MG SL tablet, DISSOLVE 1 TABLET UNDER TONGUE AS NEEDEDFOR CHEST PAIN. MAY REPEAT 5 MINUTES APART 3 TIMES IF NEEDED, Disp: 25 tablet, Rfl: 0   nortriptyline (PAMELOR) 10 MG capsule, Take 1-2 capsules (10-20 mg total) by mouth at bedtime., Disp: 60 capsule, Rfl: 3   ondansetron (ZOFRAN) 4 MG tablet, Take 1 tablet (4 mg total) by mouth every 8 (eight) hours., Disp: 90 tablet, Rfl: 1   OneTouch  Delica Lancets 64B MISC, 1 each by In Vitro route daily. Dx Code E11.49, Disp: 100 each, Rfl: 3   OXYGEN, Inhale 2 L into the lungs at bedtime. May use in the day time if he gets "winded", Disp: , Rfl:    pantoprazole (PROTONIX) 40 MG tablet, TAKE 1 TABLET BY MOUTH TWICE (2) DAILY, Disp: 180 tablet, Rfl: 3   polyethylene glycol (MIRALAX / GLYCOLAX) 17 g packet, Take 17 g by mouth every other day., Disp: 1 each, Rfl: 0   pregabalin (LYRICA) 50 MG capsule, Take 1 capsule (50 mg total) by mouth at bedtime., Disp: 30 capsule, Rfl: 5   rosuvastatin (CRESTOR) 10 MG tablet, Take 1 tablet (10 mg total) by mouth daily., Disp: 90 tablet, Rfl: 3   tamsulosin (FLOMAX) 0.4 MG CAPS capsule, Take 0.4 mg by mouth at bedtime., Disp: , Rfl:    Glycopyrrolate-Formoterol (BEVESPI AEROSPHERE) 9-4.8 MCG/ACT AERO, Inhale 2 puffs into the lungs 2 (two) times daily. (Patient not taking: Reported on 03/22/2022), Disp: 10.7 g, Rfl: 5      Objective:   Vitals:   03/22/22 1256  BP: 124/66  Pulse: 89  Temp: 98.6 F (37 C)  TempSrc: Oral  SpO2: 94%  Weight: 195 lb (88.5 kg)  Height: 6' (1.829 m)    Estimated body mass index is 26.45 kg/m as calculated from the following:  Height as of this encounter: 6' (1.829 m).   Weight as of this encounter: 195 lb (88.5 kg).  _0 @  Filed Weights   03/22/22 1256  Weight: 195 lb (88.5 kg)     Physical Exam General: No distress. Looks well Neuro: Alert and Oriented x 3. GCS 15. Speech normal Psych: Pleasant Resp:  Barrel Chest - no.  Wheeze - no, Crackles - YES BASE, No overt respiratory distress CVS: Normal heart sounds. Murmurs - no Ext: Stigmata of Connective Tissue Disease - no HEENT: Normal upper airway. PEERL +. No post nasal drip        Assessment:       ICD-10-CM   1. DOE (dyspnea on exertion)  R06.09     2. Bronchiectasis without complication ([place])  P54.6 CT Chest Wo Contrast    3. Vaccine counseling  Z71.85          Plan:      Patient Instructions     ICD-10-CM   1. DOE (dyspnea on exertion)  R06.09     2. Bronchiectasis without complication ([place])  F68.1     3. Vaccine counseling  Z71.85       Your shortness of breath is multifactorial and related to physical deconditioning, anemia, kidney disease, low nutritional state and cardiac and lung issues.  Neuro eval non-contributory   Lung function broadly stable over time  Plan Shortness of breath can be very difficult to manage and treat  Cotninue o2 2L [place] at night  Get high-resolution CT chest supine and prone  in 6 months  Continue oxygen use at night and also for subjective reasons in the daytime  Try spiriva respimat 2 puff once daily  Get RESV vaccine commercially  Followup - 6 months or sooner if needed    SIGNATURE    Dr. Brand Males, M.D., F.C.C.P,  Pulmonary and Critical Care Medicine Staff Physician, [place] Director - Interstitial Lung Disease  Program  Pulmonary [place] at [place], [place], [address]  Pager: [phone], If no answer or between  15:00h - 7:00h: call [phone] Telephone: [phone]  1:33 PM 03/22/2022

## 2022-03-22 NOTE — Patient Instructions (Signed)
Full PFT performed today. °

## 2022-03-22 NOTE — Progress Notes (Signed)
Full PFT performed today. °

## 2022-03-23 ENCOUNTER — Encounter: Payer: Self-pay | Admitting: Internal Medicine

## 2022-03-24 ENCOUNTER — Other Ambulatory Visit: Payer: Self-pay

## 2022-03-24 MED ORDER — SPIRIVA RESPIMAT 2.5 MCG/ACT IN AERS: 2.0000 | INHALATION_SPRAY | Freq: Every day | RESPIRATORY_TRACT | 5 refills | Status: DC

## 2022-03-24 NOTE — Telephone Encounter (Signed)
Try spiriva respimat 2.5

## 2022-04-01 ENCOUNTER — Telehealth: Payer: Self-pay | Admitting: Internal Medicine

## 2022-04-01 ENCOUNTER — Other Ambulatory Visit: Payer: Self-pay | Admitting: Internal Medicine

## 2022-04-01 NOTE — Telephone Encounter (Signed)
[name] from [facility] called in and was wanting to speak with you in regards to HYDROcodone-acetaminophen ([place]) 10-325 MG tablet. She stated that patient signed up for the bubble packing program, which the patient gets every month. She wants to fill the medication in advance but she is having an issue because Hydrocodone isn't due until the 8th of January. She can be reached at [phone]. Thank you!

## 2022-04-01 NOTE — Telephone Encounter (Signed)
Spoke to [place] at [employer]. He will pass the message to [facility].

## 2022-04-03 ENCOUNTER — Emergency Department (HOSPITAL_COMMUNITY): Payer: Medicare Other

## 2022-04-03 ENCOUNTER — Encounter (HOSPITAL_COMMUNITY): Payer: Self-pay | Admitting: Internal Medicine

## 2022-04-03 ENCOUNTER — Inpatient Hospital Stay (HOSPITAL_COMMUNITY)
Admission: EM | Admit: 2022-04-03 | Discharge: 2022-04-12 | DRG: 280 | Disposition: A | Discharge status: Hospice | Payer: Medicare Other | Attending: Family Medicine | Admitting: Family Medicine

## 2022-04-03 DIAGNOSIS — J9611 Chronic respiratory failure with hypoxia: Secondary | ICD-10-CM | POA: Diagnosis not present

## 2022-04-03 DIAGNOSIS — Z87891 Personal history of nicotine dependence: Secondary | ICD-10-CM

## 2022-04-03 DIAGNOSIS — F419 Anxiety disorder, unspecified: Secondary | ICD-10-CM | POA: Diagnosis present

## 2022-04-03 DIAGNOSIS — Z96651 Presence of right artificial knee joint: Secondary | ICD-10-CM | POA: Diagnosis present

## 2022-04-03 DIAGNOSIS — Y92239 Unspecified place in hospital as the place of occurrence of the external cause: Secondary | ICD-10-CM | POA: Diagnosis not present

## 2022-04-03 DIAGNOSIS — N179 Acute kidney failure, unspecified: Secondary | ICD-10-CM | POA: Diagnosis not present

## 2022-04-03 DIAGNOSIS — Z79899 Other long term (current) drug therapy: Secondary | ICD-10-CM | POA: Diagnosis not present

## 2022-04-03 DIAGNOSIS — Z961 Presence of intraocular lens: Secondary | ICD-10-CM | POA: Diagnosis present

## 2022-04-03 DIAGNOSIS — I350 Nonrheumatic aortic (valve) stenosis: Secondary | ICD-10-CM | POA: Diagnosis present

## 2022-04-03 DIAGNOSIS — I509 Heart failure, unspecified: Secondary | ICD-10-CM | POA: Diagnosis not present

## 2022-04-03 DIAGNOSIS — I5043 Acute on chronic combined systolic (congestive) and diastolic (congestive) heart failure: Secondary | ICD-10-CM | POA: Diagnosis present

## 2022-04-03 DIAGNOSIS — J849 Interstitial pulmonary disease, unspecified: Secondary | ICD-10-CM

## 2022-04-03 DIAGNOSIS — J189 Pneumonia, unspecified organism: Secondary | ICD-10-CM | POA: Diagnosis present

## 2022-04-03 DIAGNOSIS — Z800 Family history of malignant neoplasm of digestive organs: Secondary | ICD-10-CM

## 2022-04-03 DIAGNOSIS — R000 Tachycardia, unspecified: Secondary | ICD-10-CM | POA: Diagnosis present

## 2022-04-03 DIAGNOSIS — N189 Chronic kidney disease, unspecified: Secondary | ICD-10-CM | POA: Diagnosis not present

## 2022-04-03 DIAGNOSIS — E1165 Type 2 diabetes mellitus with hyperglycemia: Secondary | ICD-10-CM | POA: Diagnosis present

## 2022-04-03 DIAGNOSIS — Z955 Presence of coronary angioplasty implant and graft: Secondary | ICD-10-CM

## 2022-04-03 DIAGNOSIS — H3530 Unspecified macular degeneration: Secondary | ICD-10-CM | POA: Diagnosis present

## 2022-04-03 DIAGNOSIS — E875 Hyperkalemia: Secondary | ICD-10-CM | POA: Diagnosis not present

## 2022-04-03 DIAGNOSIS — M810 Age-related osteoporosis without current pathological fracture: Secondary | ICD-10-CM | POA: Diagnosis present

## 2022-04-03 DIAGNOSIS — E785 Hyperlipidemia, unspecified: Secondary | ICD-10-CM | POA: Diagnosis not present

## 2022-04-03 DIAGNOSIS — Z833 Family history of diabetes mellitus: Secondary | ICD-10-CM

## 2022-04-03 DIAGNOSIS — R0789 Other chest pain: Secondary | ICD-10-CM | POA: Diagnosis not present

## 2022-04-03 DIAGNOSIS — I252 Old myocardial infarction: Secondary | ICD-10-CM

## 2022-04-03 DIAGNOSIS — R079 Chest pain, unspecified: Principal | ICD-10-CM

## 2022-04-03 DIAGNOSIS — R7989 Other specified abnormal findings of blood chemistry: Secondary | ICD-10-CM | POA: Diagnosis present

## 2022-04-03 DIAGNOSIS — N184 Chronic kidney disease, stage 4 (severe): Secondary | ICD-10-CM | POA: Diagnosis not present

## 2022-04-03 DIAGNOSIS — M069 Rheumatoid arthritis, unspecified: Secondary | ICD-10-CM | POA: Diagnosis present

## 2022-04-03 DIAGNOSIS — R54 Age-related physical debility: Secondary | ICD-10-CM | POA: Diagnosis present

## 2022-04-03 DIAGNOSIS — I130 Hypertensive heart and chronic kidney disease with heart failure and stage 1 through stage 4 chronic kidney disease, or unspecified chronic kidney disease: Secondary | ICD-10-CM | POA: Diagnosis not present

## 2022-04-03 DIAGNOSIS — Z7984 Long term (current) use of oral hypoglycemic drugs: Secondary | ICD-10-CM

## 2022-04-03 DIAGNOSIS — D72829 Elevated white blood cell count, unspecified: Secondary | ICD-10-CM | POA: Diagnosis not present

## 2022-04-03 DIAGNOSIS — Z7401 Bed confinement status: Secondary | ICD-10-CM | POA: Diagnosis not present

## 2022-04-03 DIAGNOSIS — J470 Bronchiectasis with acute lower respiratory infection: Secondary | ICD-10-CM | POA: Diagnosis present

## 2022-04-03 DIAGNOSIS — J9610 Chronic respiratory failure, unspecified whether with hypoxia or hypercapnia: Secondary | ICD-10-CM | POA: Diagnosis present

## 2022-04-03 DIAGNOSIS — K219 Gastro-esophageal reflux disease without esophagitis: Secondary | ICD-10-CM | POA: Diagnosis present

## 2022-04-03 DIAGNOSIS — Z87442 Personal history of urinary calculi: Secondary | ICD-10-CM

## 2022-04-03 DIAGNOSIS — J90 Pleural effusion, not elsewhere classified: Secondary | ICD-10-CM | POA: Diagnosis not present

## 2022-04-03 DIAGNOSIS — Z9981 Dependence on supplemental oxygen: Secondary | ICD-10-CM

## 2022-04-03 DIAGNOSIS — E1122 Type 2 diabetes mellitus with diabetic chronic kidney disease: Secondary | ICD-10-CM | POA: Diagnosis present

## 2022-04-03 DIAGNOSIS — Z743 Need for continuous supervision: Secondary | ICD-10-CM | POA: Diagnosis not present

## 2022-04-03 DIAGNOSIS — I255 Ischemic cardiomyopathy: Secondary | ICD-10-CM | POA: Diagnosis present

## 2022-04-03 DIAGNOSIS — I493 Ventricular premature depolarization: Secondary | ICD-10-CM | POA: Diagnosis present

## 2022-04-03 DIAGNOSIS — I25700 Atherosclerosis of coronary artery bypass graft(s), unspecified, with unstable angina pectoris: Secondary | ICD-10-CM

## 2022-04-03 DIAGNOSIS — E1142 Type 2 diabetes mellitus with diabetic polyneuropathy: Secondary | ICD-10-CM | POA: Diagnosis present

## 2022-04-03 DIAGNOSIS — Z8249 Family history of ischemic heart disease and other diseases of the circulatory system: Secondary | ICD-10-CM

## 2022-04-03 DIAGNOSIS — Z825 Family history of asthma and other chronic lower respiratory diseases: Secondary | ICD-10-CM

## 2022-04-03 DIAGNOSIS — Z811 Family history of alcohol abuse and dependence: Secondary | ICD-10-CM

## 2022-04-03 DIAGNOSIS — Z86010 Personal history of colonic polyps: Secondary | ICD-10-CM

## 2022-04-03 DIAGNOSIS — Z515 Encounter for palliative care: Secondary | ICD-10-CM | POA: Diagnosis not present

## 2022-04-03 DIAGNOSIS — Z66 Do not resuscitate: Secondary | ICD-10-CM | POA: Diagnosis present

## 2022-04-03 DIAGNOSIS — I5020 Unspecified systolic (congestive) heart failure: Secondary | ICD-10-CM

## 2022-04-03 DIAGNOSIS — I25119 Atherosclerotic heart disease of native coronary artery with unspecified angina pectoris: Secondary | ICD-10-CM | POA: Diagnosis present

## 2022-04-03 DIAGNOSIS — N400 Enlarged prostate without lower urinary tract symptoms: Secondary | ICD-10-CM | POA: Diagnosis present

## 2022-04-03 DIAGNOSIS — Z951 Presence of aortocoronary bypass graft: Secondary | ICD-10-CM

## 2022-04-03 DIAGNOSIS — R0600 Dyspnea, unspecified: Secondary | ICD-10-CM | POA: Diagnosis not present

## 2022-04-03 DIAGNOSIS — M059 Rheumatoid arthritis with rheumatoid factor, unspecified: Secondary | ICD-10-CM | POA: Diagnosis present

## 2022-04-03 DIAGNOSIS — R739 Hyperglycemia, unspecified: Secondary | ICD-10-CM | POA: Diagnosis not present

## 2022-04-03 DIAGNOSIS — Z9841 Cataract extraction status, right eye: Secondary | ICD-10-CM

## 2022-04-03 DIAGNOSIS — Z8616 Personal history of COVID-19: Secondary | ICD-10-CM

## 2022-04-03 DIAGNOSIS — F32A Depression, unspecified: Secondary | ICD-10-CM | POA: Diagnosis not present

## 2022-04-03 DIAGNOSIS — R471 Dysarthria and anarthria: Secondary | ICD-10-CM | POA: Diagnosis present

## 2022-04-03 DIAGNOSIS — Z8551 Personal history of malignant neoplasm of bladder: Secondary | ICD-10-CM

## 2022-04-03 DIAGNOSIS — Z9842 Cataract extraction status, left eye: Secondary | ICD-10-CM

## 2022-04-03 DIAGNOSIS — Z823 Family history of stroke: Secondary | ICD-10-CM

## 2022-04-03 DIAGNOSIS — J479 Bronchiectasis, uncomplicated: Secondary | ICD-10-CM

## 2022-04-03 DIAGNOSIS — T501X5A Adverse effect of loop [high-ceiling] diuretics, initial encounter: Secondary | ICD-10-CM | POA: Diagnosis not present

## 2022-04-03 DIAGNOSIS — I214 Non-ST elevation (NSTEMI) myocardial infarction: Secondary | ICD-10-CM | POA: Diagnosis not present

## 2022-04-03 DIAGNOSIS — Z7189 Other specified counseling: Secondary | ICD-10-CM | POA: Diagnosis not present

## 2022-04-03 DIAGNOSIS — I5021 Acute systolic (congestive) heart failure: Secondary | ICD-10-CM | POA: Diagnosis not present

## 2022-04-03 DIAGNOSIS — Z7982 Long term (current) use of aspirin: Secondary | ICD-10-CM

## 2022-04-03 DIAGNOSIS — Z888 Allergy status to other drugs, medicaments and biological substances status: Secondary | ICD-10-CM

## 2022-04-03 DIAGNOSIS — R404 Transient alteration of awareness: Secondary | ICD-10-CM | POA: Diagnosis not present

## 2022-04-03 LAB — CBC
HCT: 35.3 % — ABNORMAL LOW (ref 39.0–52.0)
Hemoglobin: 11.5 g/dL — ABNORMAL LOW (ref 13.0–17.0)
MCH: 28.6 pg (ref 26.0–34.0)
MCHC: 32.6 g/dL (ref 30.0–36.0)
MCV: 87.8 fL (ref 80.0–100.0)
Platelets: 194 10*3/uL (ref 150–400)
RBC: 4.02 MIL/uL — ABNORMAL LOW (ref 4.22–5.81)
RDW: 14.3 % (ref 11.5–15.5)
WBC: 10.6 10*3/uL — ABNORMAL HIGH (ref 4.0–10.5)
nRBC: 0 % (ref 0.0–0.2)

## 2022-04-03 LAB — RESP PANEL BY RT-PCR (RSV, FLU A&B, COVID)  RVPGX2
Influenza A by PCR: NEGATIVE
Influenza B by PCR: NEGATIVE
Resp Syncytial Virus by PCR: NEGATIVE
SARS Coronavirus 2 by RT PCR: NEGATIVE

## 2022-04-03 LAB — LACTIC ACID, PLASMA
Lactic Acid, Venous: 1.7 mmol/L (ref 0.5–1.9)
Lactic Acid, Venous: 1.2 mmol/L (ref 0.5–1.9)

## 2022-04-03 LAB — CBG MONITORING, ED: Glucose-Capillary: 293 mg/dL — ABNORMAL HIGH (ref 70–99)

## 2022-04-03 LAB — BASIC METABOLIC PANEL
Anion gap: 7 (ref 5–15)
BUN: 45 mg/dL — ABNORMAL HIGH (ref 8–23)
CO2: 27 mmol/L (ref 22–32)
Calcium: 9.3 mg/dL (ref 8.9–10.3)
Chloride: 97 mmol/L — ABNORMAL LOW (ref 98–111)
Creatinine, Ser: 2.85 mg/dL — ABNORMAL HIGH (ref 0.61–1.24)
GFR, Estimated: 21 mL/min — ABNORMAL LOW (ref >60)
Glucose, Bld: 304 mg/dL — ABNORMAL HIGH (ref 70–99)
Potassium: 5.4 mmol/L — ABNORMAL HIGH (ref 3.5–5.1)
Sodium: 131 mmol/L — ABNORMAL LOW (ref 135–145)

## 2022-04-03 LAB — TROPONIN I (HIGH SENSITIVITY)
Troponin I (High Sensitivity): 123 ng/L (ref <18)
Troponin I (High Sensitivity): 109 ng/L (ref <18)

## 2022-04-03 LAB — GLUCOSE, CAPILLARY
Glucose-Capillary: 212 mg/dL — ABNORMAL HIGH (ref 70–99)
Glucose-Capillary: 146 mg/dL — ABNORMAL HIGH (ref 70–99)

## 2022-04-03 LAB — BRAIN NATRIURETIC PEPTIDE: B Natriuretic Peptide: 447.8 pg/mL — ABNORMAL HIGH (ref 0.0–100.0)

## 2022-04-03 LAB — PROCALCITONIN: Procalcitonin: 0.19 ng/mL

## 2022-04-03 MED ORDER — FINASTERIDE 5 MG PO TABS
5.0000 mg | ORAL_TABLET | Freq: Every day | ORAL | Status: DC
  Administered 2022-04-03 – 2022-04-12 (×10): 5 mg via ORAL
  Filled 2022-04-03 (×10): qty 1

## 2022-04-03 MED ORDER — CLOPIDOGREL BISULFATE 75 MG PO TABS
75.0000 mg | ORAL_TABLET | Freq: Every day | ORAL | Status: DC
  Administered 2022-04-03 – 2022-04-12 (×10): 75 mg via ORAL
  Filled 2022-04-03 (×10): qty 1

## 2022-04-03 MED ORDER — SODIUM CHLORIDE 0.9% FLUSH
3.0000 mL | Freq: Two times a day (BID) | INTRAVENOUS | Status: DC
  Administered 2022-04-03 – 2022-04-11 (×16): 3 mL via INTRAVENOUS

## 2022-04-03 MED ORDER — HYDRALAZINE HCL 20 MG/ML IJ SOLN: 10.0000 mg | INTRAMUSCULAR | Status: DC | PRN

## 2022-04-03 MED ORDER — SODIUM CHLORIDE 0.9 % IV SOLN
500.0000 mg | Freq: Once | INTRAVENOUS | Status: AC
  Administered 2022-04-03: 500 mg via INTRAVENOUS
  Filled 2022-04-03: qty 5

## 2022-04-03 MED ORDER — SODIUM CHLORIDE 0.9 % IV SOLN
1.0000 g | Freq: Once | INTRAVENOUS | Status: AC
  Administered 2022-04-03: 1 g via INTRAVENOUS
  Filled 2022-04-03: qty 10

## 2022-04-03 MED ORDER — FUROSEMIDE 10 MG/ML IJ SOLN
40.0000 mg | Freq: Two times a day (BID) | INTRAMUSCULAR | Status: AC
  Administered 2022-04-04: 40 mg via INTRAVENOUS
  Filled 2022-04-03: qty 4

## 2022-04-03 MED ORDER — ACETAMINOPHEN 325 MG PO TABS
650.0000 mg | ORAL_TABLET | Freq: Four times a day (QID) | ORAL | Status: DC | PRN
  Administered 2022-04-10 – 2022-04-11 (×3): 650 mg via ORAL
  Filled 2022-04-03 (×3): qty 2

## 2022-04-03 MED ORDER — ALBUTEROL SULFATE (2.5 MG/3ML) 0.083% IN NEBU
2.5000 mg | INHALATION_SOLUTION | Freq: Four times a day (QID) | RESPIRATORY_TRACT | Status: DC | PRN
  Administered 2022-04-05 – 2022-04-10 (×5): 2.5 mg via RESPIRATORY_TRACT
  Filled 2022-04-03 (×5): qty 3

## 2022-04-03 MED ORDER — SODIUM CHLORIDE 0.9 % IV SOLN
2.0000 g | INTRAVENOUS | Status: AC
  Administered 2022-04-04 – 2022-04-07 (×4): 2 g via INTRAVENOUS
  Filled 2022-04-03 (×4): qty 20

## 2022-04-03 MED ORDER — SODIUM CHLORIDE 0.9 % IV SOLN
500.0000 mg | INTRAVENOUS | Status: AC
  Administered 2022-04-04 – 2022-04-07 (×4): 500 mg via INTRAVENOUS
  Filled 2022-04-03 (×4): qty 5

## 2022-04-03 MED ORDER — TIOTROPIUM BROMIDE MONOHYDRATE 2.5 MCG/ACT IN AERS: 2.0000 | INHALATION_SPRAY | Freq: Every day | RESPIRATORY_TRACT | Status: DC

## 2022-04-03 MED ORDER — PREGABALIN 25 MG PO CAPS
50.0000 mg | ORAL_CAPSULE | Freq: Every day | ORAL | Status: DC
  Administered 2022-04-03 – 2022-04-11 (×9): 50 mg via ORAL
  Filled 2022-04-03 (×9): qty 2

## 2022-04-03 MED ORDER — NORTRIPTYLINE HCL 10 MG PO CAPS
10.0000 mg | ORAL_CAPSULE | Freq: Every day | ORAL | Status: DC
  Administered 2022-04-04 – 2022-04-07 (×4): 20 mg via ORAL
  Administered 2022-04-08: 10 mg via ORAL
  Administered 2022-04-09 – 2022-04-11 (×3): 20 mg via ORAL
  Filled 2022-04-03 (×10): qty 2

## 2022-04-03 MED ORDER — GUAIFENESIN ER 600 MG PO TB12
600.0000 mg | ORAL_TABLET | Freq: Two times a day (BID) | ORAL | Status: DC
  Administered 2022-04-03 – 2022-04-12 (×19): 600 mg via ORAL
  Filled 2022-04-03 (×19): qty 1

## 2022-04-03 MED ORDER — FUROSEMIDE 10 MG/ML IJ SOLN
40.0000 mg | Freq: Two times a day (BID) | INTRAMUSCULAR | Status: DC
  Administered 2022-04-03: 40 mg via INTRAVENOUS
  Filled 2022-04-03: qty 4

## 2022-04-03 MED ORDER — HEPARIN BOLUS VIA INFUSION
4000.0000 [IU] | Freq: Once | INTRAVENOUS | Status: AC
  Administered 2022-04-03: 4000 [IU] via INTRAVENOUS
  Filled 2022-04-03: qty 4000

## 2022-04-03 MED ORDER — INSULIN ASPART 100 UNIT/ML IJ SOLN
0.0000 [IU] | Freq: Three times a day (TID) | INTRAMUSCULAR | Status: DC
  Administered 2022-04-03: 5 [IU] via SUBCUTANEOUS
  Administered 2022-04-04: 8 [IU] via SUBCUTANEOUS
  Administered 2022-04-04: 3 [IU] via SUBCUTANEOUS
  Administered 2022-04-04: 5 [IU] via SUBCUTANEOUS
  Administered 2022-04-05: 3 [IU] via SUBCUTANEOUS
  Administered 2022-04-05 (×2): 5 [IU] via SUBCUTANEOUS
  Administered 2022-04-06 (×2): 3 [IU] via SUBCUTANEOUS
  Administered 2022-04-06: 5 [IU] via SUBCUTANEOUS
  Administered 2022-04-07: 8 [IU] via SUBCUTANEOUS
  Administered 2022-04-07: 5 [IU] via SUBCUTANEOUS
  Administered 2022-04-07: 3 [IU] via SUBCUTANEOUS
  Administered 2022-04-08: 8 [IU] via SUBCUTANEOUS
  Administered 2022-04-08: 5 [IU] via SUBCUTANEOUS
  Administered 2022-04-08 – 2022-04-09 (×2): 3 [IU] via SUBCUTANEOUS
  Administered 2022-04-09: 8 [IU] via SUBCUTANEOUS
  Administered 2022-04-09: 11 [IU] via SUBCUTANEOUS
  Administered 2022-04-10 (×3): 5 [IU] via SUBCUTANEOUS
  Administered 2022-04-11: 8 [IU] via SUBCUTANEOUS
  Administered 2022-04-11: 5 [IU] via SUBCUTANEOUS
  Administered 2022-04-11: 8 [IU] via SUBCUTANEOUS
  Administered 2022-04-12: 5 [IU] via SUBCUTANEOUS

## 2022-04-03 MED ORDER — ROSUVASTATIN CALCIUM 5 MG PO TABS
10.0000 mg | ORAL_TABLET | Freq: Every day | ORAL | Status: DC
  Administered 2022-04-03 – 2022-04-07 (×5): 10 mg via ORAL
  Filled 2022-04-03 (×5): qty 2

## 2022-04-03 MED ORDER — INSULIN ASPART 100 UNIT/ML IJ SOLN
0.0000 [IU] | Freq: Every day | INTRAMUSCULAR | Status: DC
  Administered 2022-04-08: 2 [IU] via SUBCUTANEOUS
  Administered 2022-04-09: 3 [IU] via SUBCUTANEOUS
  Administered 2022-04-10: 2 [IU] via SUBCUTANEOUS
  Administered 2022-04-11: 4 [IU] via SUBCUTANEOUS

## 2022-04-03 MED ORDER — ASPIRIN 81 MG PO TBEC
81.0000 mg | DELAYED_RELEASE_TABLET | Freq: Every day | ORAL | Status: DC
  Administered 2022-04-03 – 2022-04-11 (×9): 81 mg via ORAL
  Filled 2022-04-03 (×9): qty 1

## 2022-04-03 MED ORDER — PANTOPRAZOLE SODIUM 40 MG PO TBEC
40.0000 mg | DELAYED_RELEASE_TABLET | Freq: Every day | ORAL | Status: DC
  Administered 2022-04-03 – 2022-04-12 (×10): 40 mg via ORAL
  Filled 2022-04-03 (×10): qty 1

## 2022-04-03 MED ORDER — UMECLIDINIUM BROMIDE 62.5 MCG/ACT IN AEPB
1.0000 | INHALATION_SPRAY | Freq: Every day | RESPIRATORY_TRACT | Status: DC
  Administered 2022-04-04 – 2022-04-12 (×9): 1 via RESPIRATORY_TRACT
  Filled 2022-04-03 (×2): qty 7

## 2022-04-03 MED ORDER — ACETAMINOPHEN 650 MG RE SUPP: 650.0000 mg | Freq: Four times a day (QID) | RECTAL | Status: DC | PRN

## 2022-04-03 MED ORDER — HEPARIN (PORCINE) 25000 UT/250ML-% IV SOLN
1350.0000 [IU]/h | INTRAVENOUS | Status: DC
  Administered 2022-04-03: 1050 [IU]/h via INTRAVENOUS
  Administered 2022-04-04: 1200 [IU]/h via INTRAVENOUS
  Filled 2022-04-03 (×3): qty 250

## 2022-04-03 MED ORDER — HYDROCODONE-ACETAMINOPHEN 10-325 MG PO TABS
1.0000 | ORAL_TABLET | Freq: Four times a day (QID) | ORAL | Status: DC | PRN
  Administered 2022-04-03 – 2022-04-12 (×13): 1 via ORAL
  Filled 2022-04-03 (×13): qty 1

## 2022-04-03 MED ORDER — TAMSULOSIN HCL 0.4 MG PO CAPS
0.4000 mg | ORAL_CAPSULE | Freq: Every day | ORAL | Status: DC
  Administered 2022-04-03 – 2022-04-11 (×9): 0.4 mg via ORAL
  Filled 2022-04-03 (×9): qty 1

## 2022-04-03 NOTE — ED Provider Notes (Signed)
[facility] EMERGENCY DEPARTMENT Provider Note   CSN: [id number] Arrival date & time: 04/03/22  1203     History  Chief Complaint  Patient presents with   Chest Pain    [name] is a 88 y.o. male.  88 year old male with prior medical history as detailed below presents for evaluation.  Patient reports onset of chest discomfort around 8 AM this morning.  Patient reports taking nitroglycerin without improvement.  Patient reports that his pain is worse with exertion.  At rest on arrival to the ED the patient appears to be comfortable.  He reports that with rest his pain is resolved.  The history is provided by the patient and medical records.       Home Medications Prior to Admission medications   Medication Sig Start Date End Date Taking? Authorizing Provider  acetaminophen (TYLENOL) 650 MG CR tablet Take 650 mg by mouth every 6 (six) hours as needed for pain.    [provider]  amLODipine (NORVASC) 5 MG tablet Take 5 mg by mouth at bedtime. 08/06/19   [provider]  aspirin EC 81 MG tablet Take 81 mg by mouth at bedtime.    [provider]  Blood Glucose Monitoring Suppl (ONE TOUCH ULTRA 2) w/Device KIT Use to obtain blood sugar daily. Dx Code E11.40 04/12/19   Viviana Simpler I, MD  clopidogrel (PLAVIX) 75 MG tablet TAKE 1 TABLET BY MOUTH ONCE A DAY 01/04/22   [name], MD  Ferrous Sulfate 28 MG TABS Take 28 mg by mouth 2 (two) times a week.    [provider]  finasteride (PROSCAR) 5 MG tablet Take 5 mg by mouth daily. 06/17/21   [provider]  fluticasone (CUTIVATE) 0.05 % cream Apply 1 application. topically daily as needed (irritation). 05/27/21   [provider]  glipiZIDE (GLUCOTROL) 5 MG tablet Take 1 tablet (5 mg total) by mouth 2 (two) times daily before a meal. 01/18/22   Viviana Simpler I, MD  glucose blood (ONE TOUCH ULTRA TEST) test strip USE TO CHECK BLOOD SUGAR ONCE A DAY Dx Code  E11.40 01/29/20   [name], MD  HYDROcodone-acetaminophen (NORCO) 10-325 MG tablet Take 1 tablet by mouth in the morning and at bedtime. 03/12/22   [name], MD  ketoconazole (NIZORAL) 2 % shampoo Apply 1 application. topically 2 (two) times a week. 05/27/21   [provider]  losartan (COZAAR) 25 MG tablet Take 50 mg by mouth daily.    [provider]  Multiple Vitamins-Minerals (ICAPS AREDS 2 PO) Take 1 capsule by mouth daily.    [provider]  nitroGLYCERIN (NITROSTAT) 0.4 MG SL tablet DISSOLVE 1 TABLET UNDER TONGUE AS NEEDEDFOR CHEST PAIN. MAY REPEAT 5 MINUTES APART 3 TIMES IF NEEDED 03/12/22   [name], MD  nortriptyline (PAMELOR) 10 MG capsule Take 1-2 capsules (10-20 mg total) by mouth at bedtime. 01/18/22   [name], MD  ondansetron (ZOFRAN) 4 MG tablet Take 1 tablet (4 mg total) by mouth every 8 (eight) hours. 11/26/21   Irene Shipper, MD  OneTouch Delica Lancets 40J MISC 1 each by In Vitro route daily. Dx Code E11.49 01/29/20   Viviana Simpler I, MD  OXYGEN Inhale 2 L into the lungs at bedtime. May use in the day time if he gets "winded"    [provider]  pantoprazole (PROTONIX) 40 MG tablet TAKE 1 TABLET BY MOUTH TWICE (2) DAILY 08/19/21  [name], MD  polyethylene glycol (MIRALAX / GLYCOLAX) 17 g packet Take 17 g by mouth every other day. 07/01/21   [name], MD  pregabalin (LYRICA) 50 MG capsule Take 1 capsule (50 mg total) by mouth at bedtime. 01/25/22   [name], MD  rosuvastatin (CRESTOR) 10 MG tablet Take 1 tablet (10 mg total) by mouth daily. 02/04/22   Richardson Dopp T, PA-C  tamsulosin (FLOMAX) 0.4 MG CAPS capsule Take 0.4 mg by mouth at bedtime. 01/31/19   [provider]  Tiotropium Bromide Monohydrate (SPIRIVA RESPIMAT) 2.5 MCG/ACT AERS Inhale 2 puffs into the lungs daily. 03/24/22   Brand Males, MD      Allergies    Metronidazole, Doxazosin mesylate, and  Methocarbamol    Review of Systems   Review of Systems  All other systems reviewed and are negative.   Physical Exam Updated Vital Signs BP 124/72 Comment: usually wears 2L O2  Pulse 94 Comment: usually wears 2L O2  Resp (!) 28 Comment: usually wears 2L O2  Ht 6' (1.829 m)   Wt 88.5 kg   SpO2 98% Comment: usually wears 2L O2  BMI 26.45 kg/m  Physical Exam Vitals and nursing note reviewed.  Constitutional:      General: He is not in acute distress.    Appearance: Normal appearance. He is well-developed.  HENT:     Head: Normocephalic and atraumatic.  Eyes:     Conjunctiva/sclera: Conjunctivae normal.     Pupils: Pupils are equal, round, and reactive to light.  Cardiovascular:     Rate and Rhythm: Normal rate and regular rhythm.     Heart sounds: Normal heart sounds.  Pulmonary:     Effort: Pulmonary effort is normal. No respiratory distress.     Comments: Decreased breath sounds at bilateral bases Abdominal:     General: There is no distension.     Palpations: Abdomen is soft.     Tenderness: There is no abdominal tenderness.  Musculoskeletal:        General: No deformity. Normal range of motion.     Cervical back: Normal range of motion and neck supple.  Skin:    General: Skin is warm and dry.  Neurological:     General: No focal deficit present.     Mental Status: He is alert and oriented to person, place, and time.     ED Results / Procedures / Treatments   Labs (all labs ordered are listed, but only abnormal results are displayed) Labs Reviewed  BASIC METABOLIC PANEL - Abnormal; Notable for the following components:      Result Value   Sodium 131 (*)    Potassium 5.4 (*)    Chloride 97 (*)    Glucose, Bld 304 (*)    BUN 45 (*)    Creatinine, Ser 2.85 (*)    GFR, Estimated 21 (*)    All other components within normal limits  CBC - Abnormal; Notable for the following components:   WBC 10.6 (*)    RBC 4.02 (*)    Hemoglobin 11.5 (*)    HCT 35.3 (*)     All other components within normal limits  CBG MONITORING, ED - Abnormal; Notable for the following components:   Glucose-Capillary 293 (*)    All other components within normal limits  TROPONIN I (HIGH SENSITIVITY) - Abnormal; Notable for the following components:   Troponin I (High Sensitivity) 123 (*)    All other components within  normal limits  CULTURE, BLOOD (ROUTINE X 2)  CULTURE, BLOOD (ROUTINE X 2)  RESP PANEL BY RT-PCR (RSV, FLU A&B, COVID)  [id number]  BRAIN NATRIURETIC PEPTIDE  LACTIC ACID, PLASMA  LACTIC ACID, PLASMA    EKG EKG Interpretation  Date/Time:  Saturday April 03 2022 12:19:36 EST Ventricular Rate:  100 PR Interval:  156 QRS Duration: 102 QT Interval:  315 QTC Calculation: 407 R Axis:   -21 Text Interpretation: Sinus tachycardia Borderline left axis deviation Borderline repolarization abnormality Confirmed by Dene Gentry [phone]) on 04/03/2022 12:28:25 PM  Radiology DG Chest Port 1 View  Result Date: 04/03/2022 CLINICAL DATA:  Chest pain EXAM: PORTABLE CHEST 1 VIEW COMPARISON:  Previous chest radiograph done on 01/27/2021, CT chest done on 03/04/2021 FINDINGS: Transverse diameter of heart is slightly increased. There is previous coronary bypass surgery. Central pulmonary vessels are prominent. Increased interstitial markings are seen in parahilar regions and lower lung fields. There is minimal blunting of right lateral CP angle. There is no pneumothorax. IMPRESSION: Increased interstitial markings are seen in right parahilar region and both lower lung fields. Findings suggest possible multifocal pneumonia. Part of this finding may be due to asymmetric interstitial edema. Minimal right pleural effusion. Electronically Signed   By: Elmer Picker M.D.   On: 04/03/2022 13:10    Procedures Procedures    Medications Ordered in ED Medications  cefTRIAXone (ROCEPHIN) 1 g in sodium chloride 0.9 % 100 mL IVPB (has no administration in time range)   azithromycin (ZITHROMAX) 500 mg in sodium chloride 0.9 % 250 mL IVPB (has no administration in time range)    ED Course/ Medical Decision Making/ A&P                           Medical Decision Making Amount and/or Complexity of Data Reviewed Labs: ordered. Radiology: ordered.  Risk Decision regarding hospitalization.    Medical Screen Complete  This patient presented to the ED with complaint of chest pain.  This complaint involves an extensive number of treatment options. The initial differential diagnosis includes, but is not limited to, ACS, unstable angina, heart failure, metabolic abnormality, pneumonia, etc.  This presentation is: Acute, Chronic, Self-Limited, Previously Undiagnosed, Uncertain Prognosis, Complicated, Systemic Symptoms, and Threat to Life/Bodily Function  Patient with known history of CAD presents with complaint of chest discomfort and pain. He appears to be comfortable on arrival.  Notable lab abnormalities include sodium of 131, potassium of 5.4, BUN 45, creatinine 2.85, troponin of 123, BNP of 447.  Case discussed briefly with Dr.Critoru with cardiology.  Cardiology will consult.  Hospitalist service made aware of case and will evaluate for admission.   Additional history obtained:  External records from outside sources obtained and reviewed including prior ED visits and prior Inpatient records.    Lab Tests:  I ordered and personally interpreted labs.  The pertinent results include: CBC, BMP, BNP, troponin,   Imaging Studies ordered:  I ordered imaging studies including chest x-ray I independently visualized and interpreted obtained imaging which showed edema versus infiltrate I agree with the radiologist interpretation.   Cardiac Monitoring:  The patient was maintained on a cardiac monitor.  I personally viewed and interpreted the cardiac monitor which showed an underlying rhythm of: NSR   Medicines ordered:  I ordered medication  including antibiotics for suspected infection Reevaluation of the patient after these medicines showed that the patient: stayed the same Problem List / ED Course:  Chest pain  Reevaluation:  After the interventions noted above, I reevaluated the patient and found that they have: stayed the same   Disposition:  After consideration of the diagnostic results and the patients response to treatment, I feel that the patent would benefit from admission.   CRITICAL CARE Performed by: Valarie Merino   Total critical care time: 30 minutes  Critical care time was exclusive of separately billable procedures and treating other patients.  Critical care was necessary to treat or prevent imminent or life-threatening deterioration.  Critical care was time spent personally by me on the following activities: development of treatment plan with patient and/or surrogate as well as nursing, discussions with consultants, evaluation of patient's response to treatment, examination of patient, obtaining history from patient or surrogate, ordering and performing treatments and interventions, ordering and review of laboratory studies, ordering and review of radiographic studies, pulse oximetry and re-evaluation of patient's condition.          Final Clinical Impression(s) / ED Diagnoses Final diagnoses:  Chest pain, unspecified type    Rx / DC Orders ED Discharge Orders     None         Valarie Merino, MD 04/03/22 1600

## 2022-04-03 NOTE — H&P (Addendum)
History and Physical    Patient: [name] OFB:[id number] DOB: [birth year] DOA: 04/03/2022 DOS: the patient was seen and examined on 04/03/2022 PCP: [name], MD  Patient coming from: Home via EMS  Chief Complaint:  Chief Complaint  Patient presents with   Chest Pain   HPI: [name] is a 88 y.o. male with medical history significant of diastolic CHF, CAD, interstitial lung disease, nocturnal hypoxemia on 2 L of nasal cannula oxygen, bladder cancer, rheumatoid arthritis, and CKD stage IV presents with complaints of chest pain over the last 2 days.  Patient reports having substernal chest discomfort with any kind of exertion for which he states it feels like he cannot breathe.  Reports having a mild cough.  His daughter notes that he had been told to stop his diuretic about a week ago and to resume if weight got higher than 195 pounds but patient has not been checking his weight routinely.  Denies having any leg swelling or orthopnea symptoms.  Normally patient only uses 2 L of oxygen at night, but has been requiring more over the last few days.  Patient is followed by Dr. Royce Macadamia nephrology in outpatient setting.  Upon admission into the emergency department patient was noted to be afebrile with pulse 94-101, respirations 20-28, and O2 saturation currently maintained on 2 L.  Labs noted WBC 10.6, hemoglobin 11.5, sodium 131, potassium 5.4, BUN 45, creatinine 2.85, glucose 304, BNP 447.8.  EKG noted more pronounced ST segment depression T wave inversion in the inferior and lateral leads.  Chest x-ray noted increased interstitial markings in the right perihilar region and both lower lung fields concerning for possible multifocal pneumonia versus asymmetric edema with minimal pleural effusion on the right.  Patient has been started on Parikh antibiotics Rocephin and azithromycin.  Cardiology had been consulted and ordered patient be started on heparin per pharmacy.   Review of  Systems: As mentioned in the history of present illness. All other systems reviewed and are negative. Past Medical History:  Diagnosis Date   Aortic stenosis    Arthritis    osteoarthritis, s/p R TKR, and digits   CAD (coronary artery disease)    a. s/p CABG (2001)  b. s/p DES to RCA and cutting POBA to ostial PDA (2013)   c. s/p DES to SVG to OM2 (01/14/14) d. cath: 08/2015 NSTEMI w/ patent LIMA-LAD and 99% stenosis of SVG-OM w/ DES placed. CTO of SVG-RCA and SVG-D1.    Cancer [facility])    bladder  and skin   Cataract    Chronic diastolic CHF (congestive heart failure) ([place])    a) 09/13 ECHO- LVEF 78-24%, grade 1 diastolic dysfunction, mild LA dilatation, atrial septal aneurysm, AV mobility restricted, but no sig AS by doppler; b) 09/04/08 ECHO- LVH, ef 60%, mild AS, c. echo 08/2015: EF perserved of 55-60% with inferolateral HK. Mild AS noted.   Chronic kidney disease, stage III (moderate) ([name])    Chronic lower back pain    Colon polyps    COVID-19    Diverticulosis    Dyspnea 2009 since July -Sept   05/06/08-CPST-  normal effort, reduced VO2 max 20.5 /65%, reduced at 8.2/ 40%, normal breathing resetvca of 55%, submaximal heart rate response 112/77%, flattened o2 pluse response at peak exercise-12 ml/beat @ 85%, No VQ mismatch abnormalities, All c/w CIRC Limitation   Enlarged prostate    Esophageal stricture    a. s/p dilation spring 2010   GERD (gastroesophageal reflux disease)  Heart murmur    Hiatal hernia    History of carpal tunnel syndrome    Bilateral   History of kidney stones    History of PFTs    mixed pattern on spiro. mild restn on lung volumes with near normal DLCO. Pattern can be explained by CABG scar. Fev1 2.2L/73%, ratio 68 (67), TLC 4.7/68%,RV 1.5L/55%,DLCO 79%   Hyperlipidemia    Hypertension    Interstitial lung disease ([name])    NOS   Iron deficiency anemia    Myocardial infarction ([name])    Nausea & vomiting    2018/2019   On home oxygen therapy    2 L [place] at  bedtime   Osteoporosis    Overweight (BMI 25.0-29.9)    BMI 29   Peripheral neuropathy    RA (rheumatoid arthritis) ([place])    [name]   Seropositive rheumatoid arthritis ([place])    Type II diabetes mellitus ([place])    diet controlled   Wears glasses    Wears partial dentures    upper   Past Surgical History:  Procedure Laterality Date   BALLOON DILATION N/A 09/12/2018   Procedure: BALLOON DILATION;  Surgeon: Irene Shipper, MD;  Location: [facility];  Service: Endoscopy;  Laterality: N/A;   BALLOON DILATION N/A 02/20/2020   Procedure: BALLOON DILATION;  Surgeon: Irene Shipper, MD;  [facility]: [facility];  Service: Endoscopy;  Laterality: N/A;   BALLOON DILATION N/A 08/24/2021   Procedure: BALLOON DILATION;  Surgeon: Irene Shipper, MD;  Location: [facility];  Service: Gastroenterology;  Laterality: N/A;   BOTOX INJECTION N/A 09/12/2018   Procedure: BOTOX INJECTION;  Surgeon: Irene Shipper, MD;  [facility];  Service: Endoscopy;  Laterality: N/A;   BOTOX INJECTION N/A 11/27/2018   Procedure: BOTOX INJECTION;  Surgeon: Irene Shipper, MD;  [facility];  Service: Endoscopy;  Laterality: N/A;   BOTOX INJECTION N/A 02/20/2020   Procedure: BOTOX INJECTION;  Surgeon: Irene Shipper, MD;  [facility];  Service: Endoscopy;  Laterality: N/A;   BOTOX INJECTION  08/24/2021   Procedure: BOTOX INJECTION;  Surgeon: Irene Shipper, MD;  [facility];  Service: Gastroenterology;;   CARDIAC CATHETERIZATION  08/2004   CP- no MI, Cath- small vessell disease    CARDIAC CATHETERIZATION  12/31/2011   80% distal LM, 100% native LAD, LCx and RCA, 30% prox SVG-OM, SVG-D1 normal, 99% distal, 80% ostial SVG-RCA distal to graft, LIMA-LAD normal; LVEF mildly decreased with posterior basal AK    CARDIAC CATHETERIZATION  2009   with patent grafts/notes 12/31/2011   CARDIAC CATHETERIZATION N/A 08/13/2015   Procedure: Left Heart Cath and Cors/Grafts Angiography;  Surgeon: Sherren Mocha, MD;  Location: [place] CV LAB;  Service: Cardiovascular;  Laterality: N/A;   CATARACT EXTRACTION W/ INTRAOCULAR LENS  IMPLANT, BILATERAL Bilateral    CHOLECYSTECTOMY OPEN  11/2003   [name]   CORONARY ANGIOPLASTY WITH STENT PLACEMENT  01/03/2012   Successful DES to SVG-RCA and cutting balloon angioplasty ostial  PDA    CORONARY ANGIOPLASTY WITH STENT PLACEMENT  01/14/2014   "1"   CORONARY ARTERY BYPASS GRAFT  11/1999   CABG X5   CORONARY STENT PLACEMENT  02/2012   1 stent and balloon   CYSTOSCOPY WITH BIOPSY N/A 03/17/2021   Procedure: DIAGNOSTIC CYSTOSCOPY WITH BIOPSY;  Surgeon: Robley Fries, MD;  [facility];  Service: Urology;  Laterality: N/A;  1 HR   CYSTOSCOPY WITH RETROGRADE PYELOGRAM, URETEROSCOPY AND STENT PLACEMENT  Right 07/28/2021   Procedure: CYSTOSCOPY WITH FULGERATION, STENT PLACEMENT, RETROGRADE PYELOGRAM,  DIAGNOSTIC URETEROSCOPY;  Surgeon: Robley Fries, MD;  [facility];  Service: Urology;  Laterality: Right;   ESOPHAGEAL DILATION  11/27/2018   Procedure: ESOPHAGEAL DILATION;  Surgeon: Irene Shipper, MD;  [facility];  Service: Endoscopy;;   ESOPHAGOGASTRODUODENOSCOPY N/A 03/01/2017   Procedure: ESOPHAGOGASTRODUODENOSCOPY (EGD);  Surgeon: Irene Shipper, MD;  [facility];  Service: Endoscopy;  Laterality: N/A;   ESOPHAGOGASTRODUODENOSCOPY (EGD) WITH ESOPHAGEAL DILATION  2010   ESOPHAGOGASTRODUODENOSCOPY (EGD) WITH PROPOFOL N/A 09/12/2018   Procedure: ESOPHAGOGASTRODUODENOSCOPY (EGD) WITH PROPOFOL;  Surgeon: Irene Shipper, MD;  [facility];  Service: Endoscopy;  Laterality: N/A;   ESOPHAGOGASTRODUODENOSCOPY (EGD) WITH PROPOFOL N/A 11/27/2018   Procedure: ESOPHAGOGASTRODUODENOSCOPY (EGD) WITH PROPOFOL, WITH BALLOON DILATION;  Surgeon: Irene Shipper, MD;  [facility];  Service: Endoscopy;  Laterality: N/A;   ESOPHAGOGASTRODUODENOSCOPY (EGD) WITH PROPOFOL N/A 02/20/2020   Procedure: ESOPHAGOGASTRODUODENOSCOPY  (EGD) WITH PROPOFOL;  Surgeon: Irene Shipper, MD;  [facility];  Service: Endoscopy;  Laterality: N/A;   ESOPHAGOGASTRODUODENOSCOPY (EGD) WITH PROPOFOL N/A 08/24/2021   Procedure: ESOPHAGOGASTRODUODENOSCOPY (EGD) WITH PROPOFOL;  Surgeon: Irene Shipper, MD;  [facility];  Service: Gastroenterology;  Laterality: N/A;   EXTRACORPOREAL SHOCK WAVE LITHOTRIPSY Right 05/11/2021   Procedure: EXTRACORPOREAL SHOCK WAVE LITHOTRIPSY (ESWL);  Surgeon: [name], MD;  Location: [facility];  Service: Urology;  Laterality: Right;   HAND SURGERY     bilateral carpal tunnel releases   JOINT REPLACEMENT     KNEE ARTHROSCOPY Right 2008   LEFT AND RIGHT HEART CATHETERIZATION WITH CORONARY ANGIOGRAM  12/31/2011   Procedure: LEFT AND RIGHT HEART CATHETERIZATION WITH CORONARY ANGIOGRAM;  Surgeon: Burnell Blanks, MD;  Location: [facility] CATH LAB;  Service: Cardiovascular;;   LEFT AND RIGHT HEART CATHETERIZATION WITH CORONARY ANGIOGRAM N/A 01/14/2014   Procedure: LEFT AND RIGHT HEART CATHETERIZATION WITH CORONARY ANGIOGRAM;  Surgeon: Peter M Martinique, MD;  Location: [facility] CATH LAB;  Service: Cardiovascular;  Laterality: N/A;   MALONEY DILATION  03/01/2017   Procedure: Venia Minks DILATION;  Surgeon: Irene Shipper, MD;  [facility];  Service: Endoscopy;;   PERCUTANEOUS CORONARY INTERVENTION-BALLOON ONLY  01/03/2012   Procedure: PERCUTANEOUS CORONARY INTERVENTION-BALLOON ONLY;  Surgeon: Peter M Martinique, MD;  Location: [facility] CATH LAB;  Service: Cardiovascular;;   PERCUTANEOUS CORONARY STENT INTERVENTION (PCI-S)  12/31/2011   Procedure: PERCUTANEOUS CORONARY STENT INTERVENTION (PCI-S);  Surgeon: Burnell Blanks, MD;  Location: [facility] CATH LAB;  Service: Cardiovascular;;   PERCUTANEOUS CORONARY STENT INTERVENTION (PCI-S) N/A 01/03/2012   Procedure: PERCUTANEOUS CORONARY STENT INTERVENTION (PCI-S);  Surgeon: Peter M Martinique, MD;  Location: [facility] CATH LAB;  Service: Cardiovascular;   Laterality: N/A;   SHOULDER ARTHROSCOPY WITH OPEN ROTATOR CUFF REPAIR AND DISTAL CLAVICLE ACROMINECTOMY Left 02/27/2013   Procedure: LEFT SHOULDER ARTHROSCOPY WITH MINI OPEN ROTATOR CUFF REPAIR AND SUBACROMIAL DECOMPRESSION AND DISTAL CLAVICLE RESECTION;  Surgeon: [name], MD;  Location: [place];  Service: Orthopedics;  Laterality: Left;   TOTAL KNEE ARTHROPLASTY Right 03/2010   [name]   TRANSURETHRAL RESECTION OF BLADDER TUMOR N/A 03/17/2021   Procedure: TRANSURETHRAL RESECTION OF BLADDER TUMOR (TURBT);  Surgeon: Robley Fries, MD;  [facility];  Service: Urology;  Laterality: N/A;   TRANSURETHRAL RESECTION OF BLADDER TUMOR WITH MITOMYCIN-C N/A 10/08/2020   Procedure: TRANSURETHRAL RESECTION OF BLADDER TUMOR WITH GEMCITABINE;  Surgeon: Robley Fries, MD;  [facility];  Service:  Urology;  Laterality: N/A;  75 MINS   TRIGGER FINGER RELEASE Left 02/27/2013   Procedure: RELEASE TRIGGER FINGER/A-1 PULLEY;  Surgeon: [name], MD;  Location: [place];  Service: Orthopedics;  Laterality: Left;   Social History:  reports that he quit smoking about 59 years ago. His smoking use included cigarettes. He has a 20.00 pack-year smoking history. He has been exposed to tobacco smoke. He has quit using smokeless tobacco. He reports that he does not drink alcohol and does not use drugs.  Allergies  Allergen Reactions   Metronidazole     Kidney Dr does not want pt to take    Doxazosin Mesylate Other (See Comments)    dizziness   Methocarbamol Rash    Family History  Problem Relation Age of Onset   COPD Mother    Heart disease Father    Heart attack Father    Stomach cancer Brother    Stroke Sister    Alcohol abuse Sister    Colon cancer Brother 25   Diabetes Brother    Rectal cancer Neg Hx     Prior to Admission medications   Medication Sig Start Date End Date Taking? Authorizing Provider  acetaminophen (TYLENOL) 650 MG CR tablet Take 650 mg by mouth every 6 (six)  hours as needed for pain.    [provider]  amLODipine (NORVASC) 5 MG tablet Take 5 mg by mouth at bedtime. 08/06/19   [provider]  aspirin EC 81 MG tablet Take 81 mg by mouth at bedtime.    [provider]  Blood Glucose Monitoring Suppl (ONE TOUCH ULTRA 2) w/Device KIT Use to obtain blood sugar daily. Dx Code E11.40 04/12/19   Viviana Simpler I, MD  clopidogrel (PLAVIX) 75 MG tablet TAKE 1 TABLET BY MOUTH ONCE A DAY 01/04/22   [name], MD  Ferrous Sulfate 28 MG TABS Take 28 mg by mouth 2 (two) times a week.    [provider]  finasteride (PROSCAR) 5 MG tablet Take 5 mg by mouth daily. 06/17/21   [provider]  fluticasone (CUTIVATE) 0.05 % cream Apply 1 application. topically daily as needed (irritation). 05/27/21   [provider]  glipiZIDE (GLUCOTROL) 5 MG tablet Take 1 tablet (5 mg total) by mouth 2 (two) times daily before a meal. 01/18/22   Viviana Simpler I, MD  glucose blood (ONE TOUCH ULTRA TEST) test strip USE TO CHECK BLOOD SUGAR ONCE A DAY Dx Code E11.40 01/29/20   [name], MD  HYDROcodone-acetaminophen (NORCO) 10-325 MG tablet Take 1 tablet by mouth in the morning and at bedtime. 03/12/22   [name], MD  ketoconazole (NIZORAL) 2 % shampoo Apply 1 application. topically 2 (two) times a week. 05/27/21   [provider]  losartan (COZAAR) 25 MG tablet Take 50 mg by mouth daily.    [provider]  Multiple Vitamins-Minerals (ICAPS AREDS 2 PO) Take 1 capsule by mouth daily.    [provider]  nitroGLYCERIN (NITROSTAT) 0.4 MG SL tablet DISSOLVE 1 TABLET UNDER TONGUE AS NEEDEDFOR CHEST PAIN. MAY REPEAT 5 MINUTES APART 3 TIMES IF NEEDED 03/12/22   [name], MD  nortriptyline (PAMELOR) 10 MG capsule Take 1-2 capsules (10-20 mg total) by mouth at bedtime. 01/18/22   [name], MD  ondansetron (ZOFRAN) 4 MG tablet Take 1 tablet (4 mg total) by mouth every 8 (eight) hours.  11/26/21   Irene Shipper, MD  OneTouch [place] Eva  Lancets 33G MISC 1 each by In Vitro route daily. Dx Code E11.49 01/29/20   Viviana Simpler I, MD  OXYGEN Inhale 2 L into the lungs at bedtime. May use in the day time if he gets "winded"    [provider]  pantoprazole (PROTONIX) 40 MG tablet TAKE 1 TABLET BY MOUTH TWICE (2) DAILY 08/19/21   [name], MD  polyethylene glycol (MIRALAX / GLYCOLAX) 17 g packet Take 17 g by mouth every other day. 07/01/21   [name], MD  pregabalin (LYRICA) 50 MG capsule Take 1 capsule (50 mg total) by mouth at bedtime. 01/25/22   [name], MD  rosuvastatin (CRESTOR) 10 MG tablet Take 1 tablet (10 mg total) by mouth daily. 02/04/22   Richardson Dopp T, PA-C  tamsulosin (FLOMAX) 0.4 MG CAPS capsule Take 0.4 mg by mouth at bedtime. 01/31/19   [provider]  Tiotropium Bromide Monohydrate (SPIRIVA RESPIMAT) 2.5 MCG/ACT AERS Inhale 2 puffs into the lungs daily. 03/24/22   Brand Males, MD    Physical Exam: Vitals:   04/03/22 1226 04/03/22 1300 04/03/22 1330 04/03/22 1500  BP:  128/74 124/72   Pulse:  96 94   Resp:  (!) 28 (!) 28   Temp:    97.8 F (36.6 C)  TempSrc:    Oral  SpO2:  98% 98%   Weight: 88.5 kg     Height: 6' (1.829 m)       Constitutional: Elderly male who appears to be in no acute distress at this time. Eyes: PERRL, lids and conjunctivae normal ENMT: Mucous membranes are moist. Posterior pharynx clear of any exudate or lesions.Normal dentition.  Neck: normal, supple, JVD present. Respiratory: Decreased overall aeration with mild upper expiratory wheeze appreciated.   Cardiovascular: Regular rate and rhythm with early systolic murmur.  No extremity edema. 2+ pedal pulses. No carotid bruits.  Abdomen: no tenderness, no masses palpated. No hepatosplenomegaly. Bowel sounds positive.  Musculoskeletal: no clubbing / cyanosis. No joint deformity upper and lower extremities. Good ROM, no contractures. Normal  muscle tone.  Skin: no rashes, lesions, ulcers. No induration Neurologic: CN 2-12 grossly intact. Sensation intact, DTR normal. Strength 5/5 in all 4.  Psychiatric: Normal judgment and insight. Alert and oriented x 3. Normal mood.   Data Reviewed:  EKG reveals sinus tachycardia with ST wave depressions T wave inversions noted in inferior and lateral leads  Assessment and Plan:   Elevated troponin CAD s/p CABG  Acute.   Patient presents with complaints of chest pain with exertion concerning for unstable angina.  High-sensitivity troponin elevated at 123.  Patient with prior history of CABG back in 2001 and multiple prior PCI's.  Last cardiac catheterization from 08/2015 noted patent LIMA- LAD, occluded SVG-RCA, SVG to diagonal with PCI/DES of SVG to OM for severe stenosis of proximal body of graft for in-stent restenosis and recommended life long DAPT with ASA/plavix.  Patient has been started on -Admit to a cardiac telemetry bed -Continue to trend high-sensitivity troponin. -Heparin drip per pharmacy -Continue aspirin, Plavix, and  statin -Follow-up echocardiogram -Appreciate cardiology consultative services, we will follow-up for any further recommendations  Possible community-acquired pneumonia chronic respiratory failure with hypoxemia Patient requiring 2 L nasal cannula oxygen continuously but normally only on 2 L of nasal cannula oxygen at night.  Reports that he has had a cough.  Chest x-ray concerning for either  multifocal pneumonia and/or asymmetric pulmonary edema.  On differential also includes the possibility of aspiration  given prior history of achalasia and dysphagia. -Continue nasal cannula oxygen maintain O2 saturation greater than 92% -Follow-up influenza, COVID-19, and RSV screening -Check procalcitonin -Continue Rocephin and azithromycin.  De-escalate when medically appropriate.  Heart failure with reduced EF Suspect acute on chronic.  On physical exam patient does  not appear grossly fluid overloaded.  Had been told to stop Lasix last week unless weight greater than 195.  BNP currently elevated at 447.8.  Last echocardiogram noted EF to be 40 to 45% with grade 1 diastolic dysfunction 04/7791.   -Strict I&Os and daily weights -Follow-up echocardiogram -Lasix 40 mg IV x 2.  Reassess and determine if need of further diuresis in a.m.  Leukocytosis Acute.  WBC elevated 10.6.  Question if this is reactive in nature or secondary to acute infection. -Check procalcitonin  Hyperkalemia Acute.  Potassium elevated at 5.4.  Patient is not on any potassium supplements at this time. -Recheck potassium in a.m.  Acute kidney injury superimposed on chronic kidney disease stage IiV On admission creatinine elevated up to 2.85 with BUN 45.  Creatinine previously noted to be 2.35 on 10/30. -Continue to monitor kidney function with diuresis -Formally consult nephrology if needed kidney function does not appear to be improving  Diabetes mellitus type 2 with hyperglycemia, without long-term use of insulin Glucose elevated at 304.  Last hemoglobin A1c 8.7 in 01/18/2022.  Home medications only include glipizide and is not on insulin. -Hypoglycemia protocol -Hold glipizide -CBGs before every meal with moderate SSI -NovoLog 0 - 5 units nightly -Adjust insulin regimen as needed  Bronchiectasis Patient followed by Dr. Estanislado Pandy of rheumatology and Dr. Chase Caller of pulmonology in the outpatient setting for bronchiectasis and mild interstitial lung disease in the setting [place] arthritis. -Continue outpatient follow-up   Essential hypertension -Reassess once med reconciliation completed.  History of bladder cancer -Continue outpatient follow-up with urology  History of seropositive rheumatoid arthritis  Hyperlipidemia -Continue Crestor  GERD -Continue Protonix  DVT prophylaxis: Advance Care Planning:   Code Status: Full Code   Consults: Cardiology  Family  Communication: Family updated at bedside  Severity of Illness: The appropriate patient status for this patient is INPATIENT. Inpatient status is judged to be reasonable and necessary in order to provide the required intensity of service to ensure the patient's safety. The patient's presenting symptoms, physical exam findings, and initial radiographic and laboratory data in the context of their chronic comorbidities is felt to place them at high risk for further clinical deterioration. Furthermore, it is not anticipated that the patient will be medically stable for discharge from the hospital within 2 midnights of admission.   * I certify that at the point of admission it is my clinical judgment that the patient will require inpatient hospital care spanning beyond 2 midnights from the point of admission due to high intensity of service, high risk for further deterioration and high frequency of surveillance required.*  Author: Norval Morton, MD 04/03/2022 3:06 PM  For on call review [email or web address].

## 2022-04-03 NOTE — ED Triage Notes (Signed)
Pt comes from home for CP starting around 8am, patient took 2 doses nitro and EMS administered another which relieved pain. Patient A&Ox4, pivoted to stretcher with assistance. CBG 418, significant cardiac hx and new dx of diabetes.

## 2022-04-03 NOTE — Progress Notes (Signed)
ANTICOAGULATION CONSULT NOTE - Initial Consult  Pharmacy Consult for heparin  Indication: chest pain/ACS  Allergies  Allergen Reactions   Metronidazole     Kidney Dr does not want pt to take    Doxazosin Mesylate Other (See Comments)    dizziness   Methocarbamol Rash    Patient Measurements: Height: 6' (182.9 cm) Weight: 88.5 kg (195 lb) IBW/kg (Calculated) : 77.6 Heparin Dosing Weight: 88.5kg   Vital Signs: Temp: 97.8 F (36.6 C) (12/30 1500) Temp Source: Oral (12/30 1500) BP: 124/72 (12/30 1330) Pulse Rate: 94 (12/30 1330)  Labs: Recent Labs    04/03/22 1221  HGB 11.5*  HCT 35.3*  PLT 194  CREATININE 2.85*  TROPONINIHS 123*    Estimated Creatinine Clearance: 19.7 mL/min (A) (by C-G formula based on SCr of 2.85 mg/dL (H)).   Medical History: Past Medical History:  Diagnosis Date   Aortic stenosis    Arthritis    osteoarthritis, s/p R TKR, and digits   CAD (coronary artery disease)    a. s/p CABG (2001)  b. s/p DES to RCA and cutting POBA to ostial PDA (2013)   c. s/p DES to SVG to OM2 (01/14/14) d. cath: 08/2015 NSTEMI w/ patent LIMA-LAD and 99% stenosis of SVG-OM w/ DES placed. CTO of SVG-RCA and SVG-D1.    Cancer [facility])    bladder  and skin   Cataract    Chronic diastolic CHF (congestive heart failure) ([place])    a) 09/13 ECHO- LVEF 19-14%, grade 1 diastolic dysfunction, mild LA dilatation, atrial septal aneurysm, AV mobility restricted, but no sig AS by doppler; b) 09/04/08 ECHO- LVH, ef 60%, mild AS, c. echo 08/2015: EF perserved of 55-60% with inferolateral HK. Mild AS noted.   Chronic kidney disease, stage III (moderate) ([name])    Chronic lower back pain    Colon polyps    COVID-19    Diverticulosis    Dyspnea 2009 since July -Sept   05/06/08-CPST-  normal effort, reduced VO2 max 20.5 /65%, reduced at 8.2/ 40%, normal breathing resetvca of 55%, submaximal heart rate response 112/77%, flattened o2 pluse response at peak exercise-12 ml/beat @ 85%, No VQ  mismatch abnormalities, All c/w CIRC Limitation   Enlarged prostate    Esophageal stricture    a. s/p dilation spring 2010   GERD (gastroesophageal reflux disease)    Heart murmur    Hiatal hernia    History of carpal tunnel syndrome    Bilateral   History of kidney stones    History of PFTs    mixed pattern on spiro. mild restn on lung volumes with near normal DLCO. Pattern can be explained by CABG scar. Fev1 2.2L/73%, ratio 68 (67), TLC 4.7/68%,RV 1.5L/55%,DLCO 79%   Hyperlipidemia    Hypertension    Interstitial lung disease ([name])    NOS   Iron deficiency anemia    Myocardial infarction ([name])    Nausea & vomiting    2018/2019   On home oxygen therapy    2 L [place] at bedtime   Osteoporosis    Overweight (BMI 25.0-29.9)    BMI 29   Peripheral neuropathy    RA (rheumatoid arthritis) ([place])    [name]   Seropositive rheumatoid arthritis ([place])    Type II diabetes mellitus ([place])    diet controlled   Wears glasses    Wears partial dentures    upper    Assessment: Patient admitted with CC of chest pain. Cardiology consulted, trop elevated to 128.  Patient with CKD, hopeful to avoid cath per cards note. Patient not on anticoagulation PTA. CBC stable.   Goal of Therapy:  Heparin level 0.3-0.7 units/ml Monitor platelets by anticoagulation protocol: Yes   Plan:  Give 4000 units bolus x 1 Start heparin infusion at 1050 units/hr Check anti-Xa level in 8 hours and daily while on heparin Continue to monitor H&H and platelets  Esmeralda Arthur, PharmD, BCCCP  04/03/2022,3:07 PM

## 2022-04-03 NOTE — Consult Note (Addendum)
Cardiology Consultation   Patient ID: [name] MRN: [id number]; DOB: 1932-03-07  Admit date: 04/03/2022 Date of Consult: 04/03/2022  PCP:  [name] Carbon, MD   [place] Prov[facility] Cardiologist:  [name] Rouge, MD     Patient Profile:   [name] is a 88 y.o. male with a hx of CAD status post CABG x5 '01 (LIMA-LAD, SVG-OM1/OM2, SVG-RCA, SVG-D1), multiple PCIs, interstitial lung disease, RA, diabetes, hypertension, hyperlipidemia, aortic stenosis, CKD stage IV and bladder CA s/p TURBT '22 who is being seen 04/03/2022 for the evaluation of chest pain at the request of Dr. Francia [name].  History of Present Illness:   [name] is an 88 year old male with past medical history noted above.  He has been followed by [name] as an outpatient.  Underwent four-vessel CABG in 2011 with subsequent PCI's as follows, DES to SVG to RCA and cutting POBA to PDA '13, DES to SVG to OM 2 '15, DES to SVG to OM1/OM 2 due to in-stent restenosis '17.  Echocardiogram 01/2021 with LVEF of 60 to 65%, mean AV gradient 14 mmHg noted as mild to moderate aortic stenosis.  Cardiac event monitor 04/2017 with sinus rhythm, isolated PACs, PVCs without significant arrhythmia.   He was last seen in the office on 12/2021 with [name] Dopp, PA for routine follow-up and reported being in his usual state of health.  At this visit he requested to stop Plavix, but it was recommended that he continue given his sensitive coronary disease.  It was unclear why he was off statin therapy, this was resumed with Crestor 10 mg daily.  Planned for outpatient echocardiogram done 01/2022 and showed LVEF of 40 to 62%, grade 1 diastolic dysfunction, normal RV size and function, moderately elevated PA pressure, mild aortic stenosis, basal inferior akinesis.   Follows with [name] with nephrology as an outpatient.   Presented to the ED on 12/30 with complaints of diffuse epigastric pain that started earlier in the  morning.  He self administered 2 doses of sublingual nitroglycerin.  On EMS arrival they administered an additional dose which relieved patient's symptoms. He did report symptoms were similar to what he has experienced with prior angina but unable to elaborate on details.  Labs in the ED showed sodium 131, potassium 5.4, creatinine 2.8, GFR 21, high-sensitivity troponin 123, WBC 10.6, hemoglobin 11.5, glucose 304.  EKG showed sinus tachycardia, 100 bpm, biphasic T wave in inferolateral leads.  Chest x-ray with increased interstitial markings concerning for possible multifocal pneumonia versus asymmetric interstitial edema.  Cardiology asked to evaluate.   Past Medical History:  Diagnosis Date   Aortic stenosis    Arthritis    osteoarthritis, s/p R TKR, and digits   CAD (coronary artery disease)    a. s/p CABG (2001)  b. s/p DES to RCA and cutting POBA to ostial PDA (2013)   c. s/p DES to SVG to OM2 (01/14/14) d. cath: 08/2015 NSTEMI w/ patent LIMA-LAD and 99% stenosis of SVG-OM w/ DES placed. CTO of SVG-RCA and SVG-D1.    Cancer [facility])    bladder  and skin   Cataract    Chronic diastolic CHF (congestive heart failure) ([employer])    a) 09/13 ECHO- LVEF 94-76%, grade 1 diastolic dysfunction, mild LA dilatation, atrial septal aneurysm, AV mobility restricted, but no sig AS by doppler; b) 09/04/08 ECHO- LVH, ef 60%, mild AS, c. echo 08/2015: EF perserved of 55-60% with inferolateral HK. Mild AS noted.   Chronic kidney  disease, stage III (moderate) ([name])    Chronic lower back pain    Colon polyps    COVID-19    Diverticulosis    Dyspnea 2009 since July -Sept   05/06/08-CPST-  normal effort, reduced VO2 max 20.5 /65%, reduced at 8.2/ 40%, normal breathing resetvca of 55%, submaximal heart rate response 112/77%, flattened o2 pluse response at peak exercise-12 ml/beat @ 85%, No VQ mismatch abnormalities, All c/w CIRC Limitation   Enlarged prostate    Esophageal stricture    a. s/p dilation spring 2010    GERD (gastroesophageal reflux disease)    Heart murmur    Hiatal hernia    History of carpal tunnel syndrome    Bilateral   History of kidney stones    History of PFTs    mixed pattern on spiro. mild restn on lung volumes with near normal DLCO. Pattern can be explained by CABG scar. Fev1 2.2L/73%, ratio 68 (67), TLC 4.7/68%,RV 1.5L/55%,DLCO 79%   Hyperlipidemia    Hypertension    Interstitial lung disease ([name])    NOS   Iron deficiency anemia    Myocardial infarction ([name])    Nausea & vomiting    2018/2019   On home oxygen therapy    2 L [place] at bedtime   Osteoporosis    Overweight (BMI 25.0-29.9)    BMI 29   Peripheral neuropathy    RA (rheumatoid arthritis) ([place])    [name]   Seropositive rheumatoid arthritis ([place])    Type II diabetes mellitus ([place])    diet controlled   Wears glasses    Wears partial dentures    upper    Past Surgical History:  Procedure Laterality Date   BALLOON DILATION N/A 09/12/2018   Procedure: BALLOON DILATION;  Surgeon: Irene Shipper, MD;  Location: [facility];  Service: Endoscopy;  Laterality: N/A;   BALLOON DILATION N/A 02/20/2020   Procedure: BALLOON DILATION;  Surgeon: Irene Shipper, MD;  [facility];  Service: Endoscopy;  Laterality: N/A;   BALLOON DILATION N/A 08/24/2021   Procedure: BALLOON DILATION;  Surgeon: Irene Shipper, MD;  Location: [facility];  Service: Gastroenterology;  Laterality: N/A;   BOTOX INJECTION N/A 09/12/2018   Procedure: BOTOX INJECTION;  Surgeon: Irene Shipper, MD;  [facility];  Service: Endoscopy;  Laterality: N/A;   BOTOX INJECTION N/A 11/27/2018   Procedure: BOTOX INJECTION;  Surgeon: Irene Shipper, MD;  [facility];  Service: Endoscopy;  Laterality: N/A;   BOTOX INJECTION N/A 02/20/2020   Procedure: BOTOX INJECTION;  Surgeon: Irene Shipper, MD;  [facility];  Service: Endoscopy;  Laterality: N/A;   BOTOX INJECTION  08/24/2021   Procedure: BOTOX INJECTION;  Surgeon:  Irene Shipper, MD;  [facility];  Service: Gastroenterology;;   CARDIAC CATHETERIZATION  08/2004   CP- no MI, Cath- small vessell disease    CARDIAC CATHETERIZATION  12/31/2011   80% distal LM, 100% native LAD, LCx and RCA, 30% prox SVG-OM, SVG-D1 normal, 99% distal, 80% ostial SVG-RCA distal to graft, LIMA-LAD normal; LVEF mildly decreased with posterior basal AK    CARDIAC CATHETERIZATION  2009   with patent grafts/notes 12/31/2011   CARDIAC CATHETERIZATION N/A 08/13/2015   Procedure: Left Heart Cath and Cors/Grafts Angiography;  Surgeon: Sherren Mocha, MD;  Location: [facility];  Service: Cardiovascular;  Laterality: N/A;   CATARACT EXTRACTION W/ INTRAOCULAR LENS  IMPLANT, BILATERAL Bilateral    CHOLECYSTECTOMY OPEN  11/2003   [name]  CORONARY ANGIOPLASTY WITH STENT PLACEMENT  01/03/2012   Successful DES to SVG-RCA and cutting balloon angioplasty ostial  PDA    CORONARY ANGIOPLASTY WITH STENT PLACEMENT  01/14/2014   "1"   CORONARY ARTERY BYPASS GRAFT  11/1999   CABG X5   CORONARY STENT PLACEMENT  02/2012   1 stent and balloon   CYSTOSCOPY WITH BIOPSY N/A 03/17/2021   Procedure: DIAGNOSTIC CYSTOSCOPY WITH BIOPSY;  Surgeon: Robley Fries, MD;  [facility];  Service: Urology;  Laterality: N/A;  1 HR   CYSTOSCOPY WITH RETROGRADE PYELOGRAM, URETEROSCOPY AND STENT PLACEMENT Right 07/28/2021   Procedure: CYSTOSCOPY WITH FULGERATION, STENT PLACEMENT, RETROGRADE PYELOGRAM,  DIAGNOSTIC URETEROSCOPY;  Surgeon: Robley Fries, MD;  [facility];  Service: Urology;  Laterality: Right;   ESOPHAGEAL DILATION  11/27/2018   Procedure: ESOPHAGEAL DILATION;  Surgeon: Irene Shipper, MD;  [facility];  Service: Endoscopy;;   ESOPHAGOGASTRODUODENOSCOPY N/A 03/01/2017   Procedure: ESOPHAGOGASTRODUODENOSCOPY (EGD);  Surgeon: Irene Shipper, MD;  Location[facility];  Service: Endoscopy;  Laterality: N/A;   ESOPHAGOGASTRODUODENOSCOPY (EGD) WITH ESOPHAGEAL DILATION   2010   ESOPHAGOGASTRODUODENOSCOPY (EGD) WITH PROPOFOL N/A 09/12/2018   Procedure: ESOPHAGOGASTRODUODENOSCOPY (EGD) WITH PROPOFOL;  Surgeon: Irene Shipper, MD;  [facility];  Service: Endoscopy;  Laterality: N/A;   ESOPHAGOGASTRODUODENOSCOPY (EGD) WITH PROPOFOL N/A 11/27/2018   Procedure: ESOPHAGOGASTRODUODENOSCOPY (EGD) WITH PROPOFOL, WITH BALLOON DILATION;  Surgeon: Irene Shipper, MD;  [facility];  Service: Endoscopy;  Laterality: N/A;   ESOPHAGOGASTRODUODENOSCOPY (EGD) WITH PROPOFOL N/A 02/20/2020   Procedure: ESOPHAGOGASTRODUODENOSCOPY (EGD) WITH PROPOFOL;  Surgeon: Irene Shipper, MD;  [facility];  Service: Endoscopy;  Laterality: N/A;   ESOPHAGOGASTRODUODENOSCOPY (EGD) WITH PROPOFOL N/A 08/24/2021   Procedure: ESOPHAGOGASTRODUODENOSCOPY (EGD) WITH PROPOFOL;  Surgeon: Irene Shipper, MD;  [facility];  Service: Gastroenterology;  Laterality: N/A;   EXTRACORPOREAL SHOCK WAVE LITHOTRIPSY Right 05/11/2021   Procedure: EXTRACORPOREAL SHOCK WAVE LITHOTRIPSY (ESWL);  Surgeon: [name], MD;  [facility];  Service: Urology;  Laterality: Right;   HAND SURGERY     bilateral carpal tunnel releases   JOINT REPLACEMENT     KNEE ARTHROSCOPY Right 2008   LEFT AND RIGHT HEART CATHETERIZATION WITH CORONARY ANGIOGRAM  12/31/2011   Procedure: LEFT AND RIGHT HEART CATHETERIZATION WITH CORONARY ANGIOGRAM;  Surgeon: Burnell Blanks, MD;  [facility] C[facility] LAB;  Service: Cardiovascular;;   LEFT AND RIGHT HEART CATHETERIZATION WITH CORONARY ANGIOGRAM N/A 01/14/2014   Procedure: LEFT AND RIGHT HEART CATHETERIZATION WITH CORONARY ANGIOGRAM;  Surgeon: Peter M Martinique, MD;  Location: [facility] CATH LAB;  Service: Cardiovascular;  Laterality: N/A;   MALONEY DILATION  03/01/2017   Procedure: [name] Minks DILATION;  Surgeon: Irene Shipper, MD;  [facility];  Service: Endoscopy;;   PERCUTANEOUS CORONARY INTERVENTION-BALLOON ONLY  01/03/2012    Procedure: PERCUTANEOUS CORONARY INTERVENTION-BALLOON ONLY;  Surgeon: Peter M Martinique, MD;  Location: [facility] CATH LAB;  Service: Cardiovascular;;   PERCUTANEOUS CORONARY STENT INTERVENTION (PCI-S)  12/31/2011   Procedure: PERCUTANEOUS CORONARY STENT INTERVENTION (PCI-S);  Surgeon: Burnell Blanks, MD;  Location: [facility] CATH LAB;  Service: Cardiovascular;;   PERCUTANEOUS CORONARY STENT INTERVENTION (PCI-S) N/A 01/03/2012   Procedure: PERCUTANEOUS CORONARY STENT INTERVENTION (PCI-S);  Surgeon: Peter M Martinique, MD;  Location: [facility] CATH LAB;  Service: Cardiovascular;  Laterality: N/A;   SHOULDER ARTHROSCOPY WITH OPEN ROTATOR CUFF REPAIR AND DISTAL CLAVICLE ACROMINECTOMY Left 02/27/2013   Procedure: LEFT SHOULDER ARTHROSCOPY WITH MINI OPEN ROTATOR CUFF REPAIR  AND SUBACROMIAL DECOMPRESSION AND DISTAL CLAVICLE RESECTION;  Surgeon: [name], MD;  Location: [facility];  Service: Orthopedics;  Laterality: Left;   TOTAL KNEE ARTHROPLASTY Right 03/2010   [name]   TRANSURETHRAL RESECTION OF BLADDER TUMOR N/A 03/17/2021   Procedure: TRANSURETHRAL RESECTION OF BLADDER TUMOR (TURBT);  Surgeon: Robley Fries, MD;  [facility];  Service: Urology;  Laterality: N/A;   TRANSURETHRAL RESECTION OF BLADDER TUMOR WITH MITOMYCIN-C N/A 10/08/2020   Procedure: TRANSURETHRAL RESECTION OF BLADDER TUMOR WITH GEMCITABINE;  Surgeon: Robley Fries, MD;  [facility];  Service: Urology;  Laterality: N/A;  75 MINS   TRIGGER FINGER RELEASE Left 02/27/2013   Procedure: RELEASE TRIGGER FINGER/A-1 PULLEY;  Surgeon: [name], MD;  Location: [facility];  Service: Orthopedics;  Laterality: Left;     Home Medications:  Prior to Admission medications   Medication Sig Start Date End Date Taking? Authorizing Provider  acetaminophen (TYLENOL) 650 MG CR tablet Take 650 mg by mouth every 6 (six) hours as needed for pain.    [provider]  amLODipine (NORVASC) 5 MG tablet Take 5 mg by mouth at bedtime. 08/06/19    [provider]  aspirin EC 81 MG tablet Take 81 mg by mouth at bedtime.    [provider]  Blood Glucose Monitoring Suppl (ONE TOUCH ULTRA 2) w/Device KIT Use to obtain blood sugar daily. Dx Code E11.40 04/12/19   Viviana Simpler I, MD  clopidogrel (PLAVIX) 75 MG tablet TAKE 1 TABLET BY MOUTH ONCE A DAY 01/04/22   [name], MD  Ferrous Sulfate 28 MG TABS Take 28 mg by mouth 2 (two) times a week.    [provider]  finasteride (PROSCAR) 5 MG tablet Take 5 mg by mouth daily. 06/17/21   [provider]  fluticasone (CUTIVATE) 0.05 % cream Apply 1 application. topically daily as needed (irritation). 05/27/21   [provider]  glipiZIDE (GLUCOTROL) 5 MG tablet Take 1 tablet (5 mg total) by mouth 2 (two) times daily before a meal. 01/18/22   Viviana Simpler I, MD  glucose blood (ONE TOUCH ULTRA TEST) test strip USE TO CHECK BLOOD SUGAR ONCE A DAY Dx Code E11.40 01/29/20   [name] Carbon, MD  HYDROcodone-acetaminophen (NORCO) 10-325 MG tablet Take 1 tablet by mouth in the morning and at bedtime. 03/12/22   [name] Carbon, MD  ketoconazole (NIZORAL) 2 % shampoo Apply 1 application. topically 2 (two) times a week. 05/27/21   [provider]  losartan (COZAAR) 25 MG tablet Take 50 mg by mouth daily.    [provider]  Multiple Vitamins-Minerals (ICAPS AREDS 2 PO) Take 1 capsule by mouth daily.    [provider]  nitroGLYCERIN (NITROSTAT) 0.4 MG SL tablet DISSOLVE 1 TABLET UNDER TONGUE AS NEEDEDFOR CHEST PAIN. MAY REPEAT 5 MINUTES APART 3 TIMES IF NEEDED 03/12/22   [name] Carbon, MD  nortriptyline (PAMELOR) 10 MG capsule Take 1-2 capsules (10-20 mg total) by mouth at bedtime. 01/18/22   [name] Carbon, MD  ondansetron (ZOFRAN) 4 MG tablet Take 1 tablet (4 mg total) by mouth every 8 (eight) hours. 11/26/21   Irene Shipper, MD  OneTouch Delica Lancets 46O MISC 1 each by In Vitro route daily. Dx Code E11.49 01/29/20   Viviana Simpler I, MD  OXYGEN Inhale 2 L into the lungs at bedtime. May use in the day time if he gets "winded"    [provider]  pantoprazole (PROTONIX) 40 MG tablet TAKE 1 TABLET BY MOUTH TWICE (2) DAILY 08/19/21   Viviana Simpler I, MD  polyethylene glycol (MIRALAX / GLYCOLAX) 17 g packet Take 17 g by mouth every other day. 07/01/21   [name] Carbon, MD  pregabalin (LYRICA) 50 MG capsule Take 1 capsule (50 mg total) by mouth at bedtime. 01/25/22   [name] Carbon, MD  rosuvastatin (CRESTOR) 10 MG tablet Take 1 tablet (10 mg total) by mouth daily. 02/04/22   [name] T, PA-C  tamsulosin (FLOMAX) 0.4 MG CAPS capsule Take 0.4 mg by mouth at bedtime. 01/31/19   [provider]  Tiotropium Bromide Monohydrate (SPIRIVA RESPIMAT) 2.5 MCG/ACT AERS Inhale 2 puffs into the lungs daily. 03/24/22   Brand Males, MD    Inpatient Medications: Scheduled Meds:  Continuous Infusions:  azithromycin (ZITHROMAX) 500 mg in sodium chloride 0.9 % 250 mL IVPB     cefTRIAXone (ROCEPHIN)  IV     PRN Meds:   Allergies:    Allergies  Allergen Reactions   Metronidazole     Kidney Dr does not want pt to take    Doxazosin Mesylate Other (See Comments)    dizziness   Methocarbamol Rash    Social History:   Social History   Socioeconomic History   Marital status: Married    Spouse name: Not on file   Number of children: 3   Years of education: Not on file   Highest education level: Not on file  Occupational History   Occupation: Designer, jewellery: RETIRED    Comment: retired  Tobacco Use   Smoking status: Former    Packs/day: 1.00    Years: 20.00    Total pack years: 20.00    Types: Cigarettes    Quit date: 04/06/1963    Years since quitting: 59.0    Passive exposure: Current   Smokeless tobacco: Former  Scientific laboratory technician Use: Never used  Substance and Sexual Activity   Alcohol use: No    Alcohol/week: 0.0 standard drinks of alcohol    Comment: 01/01/2012 "last  alcohol ~ 50 yr ago"   Drug use: No   Sexual activity: Not Currently  Other Topics Concern   Not on file  Social History Narrative   No living will   Requests wife as health care POA-- alternate is daughter [name]   Discussed DNR --he requests this (done 08/29/12)   Not sure about feeding tube---but might accept for some time   Patient lives with wife and daughter in a one story home.  Has 3 children.  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      Right Handed    Social Determinants of Health   Financial Resource Strain: Low Risk  (07/25/2020)   Overall Financial Resource Strain (CARDIA)    Difficulty of Paying Living Expenses: Not very hard  Food Insecurity: Not on file  Transportation Needs: Not on file  Physical Activity: Not on file  Stress: Not on file  Social Connections: Not on file  Intimate Partner Violence: Not on file    Family History:    Family History  Problem Relation Age of Onset   COPD Mother    Heart disease Father    Heart attack Father    Stomach cancer Brother    Stroke Sister    Alcohol abuse Sister    Colon cancer Brother 25   Diabetes Brother    Rectal cancer  Neg Hx      ROS:  Please see the history of present illness.   All other ROS reviewed and negative.     Physical Exam/Data:   Vitals:   04/03/22 1218 04/03/22 1226 04/03/22 1300 04/03/22 1330  BP: 130/74  128/74 124/72  Pulse: (!) 101  96 94  Resp: 20  (!) 28 (!) 28  SpO2: 99%  98% 98%  Weight:  88.5 kg    Height:  6' (1.829 m)     No intake or output data in the 24 hours ending 04/03/22 1341    04/03/2022   12:26 PM 03/22/2022   12:56 PM 03/12/2022   12:00 PM  Last 3 Weights  Weight (lbs) 195 lb 195 lb 195 lb  Weight (kg) 88.451 kg 88.451 kg 88.451 kg     Body mass index is 26.45 kg/m.   Physical Exam per MD  EKG:  The EKG was personally reviewed and demonstrates:  sinus tachycardia, 100 bpm, biphasic T wave in inferolateral leads   Relevant CV  Studies:  Cath: 08/2015  Prox RCA lesion, 100% stenosed. LIMA . The LIMA to LAD graft is widely patent without stenosis. Sequential SVG . The sequential saphenous vein graft to OM1 and OM 2 has critical stenosis within the previously stented segment in the proximal body of the graft. SVG . Graft known to be totally occluded, not selectively injected Origin lesion, 100% stenosed. SVG . The graft is chronically occluded Prox Graft lesion, 100% stenosed. Prox LAD lesion, 100% stenosed. Ost LM lesion, 80% stenosed. Ost 2nd Mrg to 2nd Mrg lesion, 90% stenosed. Prox Graft to Mid Graft lesion, before 1st Mrg, 99% stenosed. Post intervention, there is a 0% residual stenosis. The lesion was previously treated with a stent (unknown type).   1. Severe native three-vessel coronary artery disease with severe left main stenosis, total occlusion of the LAD, total occlusion of the RCA, and severe native left circumflex stenosis   2. Status post aortocoronary bypass surgery with continued patency of the LIMA to LAD and continued patency of the saphenous vein graft to OM with severe stenosis in the proximal body of the graft (in-stent)   3. Chronic total occlusion of the saphenous graft RCA and saphenous vein graft to diagonal   4. Successful PCI of the saphenous vein graft OM with placement of a drug-eluting stent(treatment of in-stent restenosis)   Recommendation: dual antiplatelet therapy with aspirin and Plavix at least 12 months. Consider long-term therapy if tolerated in this patient with previous bypass grafting who has required multiple PCI procedures.  Diagnostic Dominance: Right  Intervention    Echo: 01/2022  IMPRESSIONS     1. Left ventricular ejection fraction, by estimation, is 40 to 45%. The  left ventricle has mildly decreased function. The left ventricle  demonstrates regional wall motion abnormalities (see scoring  diagram/findings for description). Left ventricular   diastolic parameters are consistent with Grade I diastolic dysfunction  (impaired relaxation).   2. Right ventricular systolic function is normal. The right ventricular  size is normal. There is moderately elevated pulmonary artery systolic  pressure. The estimated right ventricular systolic pressure is 10.2 mmHg.   3. The mitral valve is normal in structure. Mild mitral valve  regurgitation. No evidence of mitral stenosis.   4. The aortic valve is calcified. There is moderate calcification of the  aortic valve. There is moderate thickening of the aortic valve. Aortic  valve regurgitation is not visualized. Mild aortic valve stenosis. Aortic  valve mean gradient measures 13.6  mmHg. Aortic valve Vmax measures 2.40 m/s.   5. The inferior vena cava is normal in size with greater than 50%  respiratory variability, suggesting right atrial pressure of 3 mmHg.   FINDINGS   Left Ventricle: Left ventricular ejection fraction, by estimation, is 40  to 45%. The left ventricle has mildly decreased function. The left  ventricle demonstrates regional wall motion abnormalities. The left  ventricular internal cavity size was normal  in size. There is no left ventricular hypertrophy. Left ventricular  diastolic parameters are consistent with Grade I diastolic dysfunction  (impaired relaxation).     LV Wall Scoring:  The basal inferior segment is akinetic.   Right Ventricle: The right ventricular size is normal. No increase in  right ventricular wall thickness. Right ventricular systolic function is  normal. There is moderately elevated pulmonary artery systolic pressure.  The tricuspid regurgitant velocity is  2.52 m/s, and with an assumed right atrial pressure of 33 mmHg, the  estimated right ventricular systolic pressure is 70.3 mmHg.   Left Atrium: Left atrial size was normal in size.   Right Atrium: Right atrial size was normal in size.   Pericardium: There is no evidence of pericardial  effusion.   Mitral Valve: The mitral valve is normal in structure. Mild mitral annular  calcification. Mild mitral valve regurgitation. No evidence of mitral  valve stenosis.   Tricuspid Valve: The tricuspid valve is normal in structure. Tricuspid  valve regurgitation is trivial. No evidence of tricuspid stenosis.   Aortic Valve: The aortic valve is calcified. There is moderate  calcification of the aortic valve. There is moderate thickening of the  aortic valve. Aortic valve regurgitation is not visualized. Aortic  regurgitation PHT measures 284 msec. Mild aortic  stenosis is present. Aortic valve mean gradient measures 13.6 mmHg. Aortic  valve peak gradient measures 23.0 mmHg. Aortic valve area, by VTI measures  1.23 cm.   Pulmonic Valve: The pulmonic valve was normal in structure. Pulmonic valve  regurgitation is not visualized. No evidence of pulmonic stenosis.   Aorta: The aortic root is normal in size and structure.   Venous: The inferior vena cava is normal in size with greater than 50%  respiratory variability, suggesting right atrial pressure of 3 mmHg.   IAS/Shunts: No atrial level shunt detected by color flow Doppler.       Laboratory Data:  High Sensitivity Troponin:   Recent Labs  Lab 04/03/22 1221  TROPONINIHS 123*     Chemistry Recent Labs  Lab 04/03/22 1221  NA 131*  K 5.4*  CL 97*  CO2 27  GLUCOSE 304*  BUN 45*  CREATININE 2.85*  CALCIUM 9.3  GFRNONAA 21*  ANIONGAP 7    No results for input(s): "PROT", "ALBUMIN", "AST", "ALT", "ALKPHOS", "BILITOT" in the last 168 hours. Lipids No results for input(s): "CHOL", "TRIG", "HDL", "LABVLDL", "LDLCALC", "CHOLHDL" in the last 168 hours.  Hematology Recent Labs  Lab 04/03/22 1221  WBC 10.6*  RBC 4.02*  HGB 11.5*  HCT 35.3*  MCV 87.8  MCH 28.6  MCHC 32.6  RDW 14.3  PLT 194   Thyroid No results for input(s): "TSH", "FREET4" in the last 168 hours.  BNPNo results for input(s): "BNP", "PROBNP"  in the last 168 hours.  DDimer No results for input(s): "DDIMER" in the last 168 hours.   Radiology/Studies:  DG Chest Port 1 View  Result Date: 04/03/2022 CLINICAL DATA:  Chest pain EXAM: PORTABLE CHEST 1 VIEW  COMPARISON:  Previous chest radiograph done on 01/27/2021, CT chest done on 03/04/2021 FINDINGS: Transverse diameter of heart is slightly increased. There is previous coronary bypass surgery. Central pulmonary vessels are prominent. Increased interstitial markings are seen in parahilar regions and lower lung fields. There is minimal blunting of right lateral CP angle. There is no pneumothorax. IMPRESSION: Increased interstitial markings are seen in right parahilar region and both lower lung fields. Findings suggest possible multifocal pneumonia. Part of this finding may be due to asymmetric interstitial edema. Minimal right pleural effusion. Electronically Signed   By: Elmer Picker M.D.   On: 04/03/2022 13:10     Assessment and Plan:   [name] is a 88 y.o. male with a hx of CAD status post CABG x5 '01 (LIMA-LAD, SVG-OM1/OM2, SVG-RCA, SVG-D1), multiple PCIs, interstitial lung disease, RA, diabetes, hypertension, hyperlipidemia, aortic stenosis and bladder CA s/p TURBT '22 who is being seen 04/03/2022 for the evaluation of chest pain at the request of Dr. Francia [name].  Chest pain Elevated troponin CAD s/p CABG x5 '01, multiple PCIs -- Complex CAD history with CABG and multiple PCI's.  Last cardiac catheterization 08/2015 with patent LIMA- LAD, occluded SVG-RCA, SVG to diagonal with PCI/DES of SVG to OM for severe stenosis of proximal body of graft for in-stent restenosis. Life long DAPT with ASA/plavix  -- developed epigastric pain this morning, nitro responsive -- initial hsTn 123, repeat pending -- start IV heparin, continue asa, plavix, statin -- repeat echo  -- given his degree of CKD, ideally would like to avoid cardiac cath and contrast exposure. Further recommendations  based on labs and echo findings  HFrEF -- Echo 01/2022 showed LVEF of 40 to 10%, grade 1 diastolic dysfunction, basal inferior wall akinesis.  CXR on admission concerning for pulmonary edema versus multifocal pneumonia -- BNP pending -- repeat echo  Community-acquired pneumonia -- Chest x-ray concerning for multifocal pneumonia, WBC 10 -- Started on antibiotics per EDP -- work up per primary   CKD stage IV -- Follows with nephrology as an outpatient -- Creatinine baseline ~ 2  Hypertension -- Stable -- on amlodipine 73m daily, no BB with ILD  Hyperlipidemia -- On Crestor 10 mg daily (max dose given his renal disease)  Diabetes -- Hgb A1c 8.7 (01/2022)  Per Primary Hyperkalemia ILD RA   Risk Assessment/Risk Scores:   New York Heart Association (NYHA) Functional Class NYHA Class II   For questions or updates, please contact [place] consult [email or web address] for contact info under    Signed, LReino Bellis NP  04/03/2022 1:41 PM   I have seen and examined the patient along with LReino Bellis NP .  I have reviewed the chart, notes and new data.  I agree with PA/NP's note.  Key new complaints: He had low retrosternal/epigastric pressure-like sensation associate with shortness of breath that improved with nitroglycerin.  This happened twice.  He reports that the discomfort is different from his previous anginal events, but is not able to recall what his angina felt like in the past.  He is currently asymptomatic.  He reports being chronically short of breath due to lung disease. Key examination changes: Regular rate and rhythm, normal S1 and S2 (still distinct), 2/6 early peaking aortic ejection murmur, no pericardial rub.  There are bilateral crackles in the bases of both lungs that do not clear with cough.  Jugular venous pulsations are a little difficult to see, but appear to be elevated, probably 8 cm above the sternal  angle.  There is no  peripheral edema Key new findings / data: ECG shows more pronounced ST segment depression T wave inversion in the inferior and lateral leads.  Minimally elevated first troponin at 125, repeat pending.   PLAN: High level of suspicion for unstable angina, most likely due to recurrent stenosis in the SVG-OM1-OM 2; but his renal function is a major disincentive to performing invasive evaluation and revascularization.  He is not certain that he would want to be on hemodialysis.  He reports that his brother was on hemodialysis but then decided to stop treatment because of poor quality of life. Intravenous heparin, aspirin, beta-blockers.  Repeat the echo to see if there is an extensive new wall motion abnormality.  Consider nuclear perfusion imaging to guide decision regarding cardiac catheterization.  Sanda Klein, MD, [place] [phone] 04/03/2022, 3:11 PM

## 2022-04-04 ENCOUNTER — Inpatient Hospital Stay (HOSPITAL_COMMUNITY): Payer: Medicare Other

## 2022-04-04 DIAGNOSIS — I5021 Acute systolic (congestive) heart failure: Secondary | ICD-10-CM | POA: Diagnosis not present

## 2022-04-04 DIAGNOSIS — R7989 Other specified abnormal findings of blood chemistry: Secondary | ICD-10-CM | POA: Diagnosis not present

## 2022-04-04 LAB — CBC
HCT: 36.9 % — ABNORMAL LOW (ref 39.0–52.0)
Hemoglobin: 11.7 g/dL — ABNORMAL LOW (ref 13.0–17.0)
MCH: 27.9 pg (ref 26.0–34.0)
MCHC: 31.7 g/dL (ref 30.0–36.0)
MCV: 87.9 fL (ref 80.0–100.0)
Platelets: 184 10*3/uL (ref 150–400)
RBC: 4.2 MIL/uL — ABNORMAL LOW (ref 4.22–5.81)
RDW: 14.4 % (ref 11.5–15.5)
WBC: 10.8 10*3/uL — ABNORMAL HIGH (ref 4.0–10.5)
nRBC: 0 % (ref 0.0–0.2)

## 2022-04-04 LAB — BASIC METABOLIC PANEL
Anion gap: 13 (ref 5–15)
BUN: 47 mg/dL — ABNORMAL HIGH (ref 8–23)
CO2: 22 mmol/L (ref 22–32)
Calcium: 9.4 mg/dL (ref 8.9–10.3)
Chloride: 99 mmol/L (ref 98–111)
Creatinine, Ser: 2.91 mg/dL — ABNORMAL HIGH (ref 0.61–1.24)
GFR, Estimated: 20 mL/min — ABNORMAL LOW (ref >60)
Glucose, Bld: 163 mg/dL — ABNORMAL HIGH (ref 70–99)
Potassium: 4.8 mmol/L (ref 3.5–5.1)
Sodium: 134 mmol/L — ABNORMAL LOW (ref 135–145)

## 2022-04-04 LAB — ECHOCARDIOGRAM COMPLETE
AR max vel: 1.13 cm2
AV Area VTI: 1.03 cm2
AV Area mean vel: 0.98 cm2
AV Mean grad: 12 mmHg
AV Peak grad: 18 mmHg
Ao pk vel: 2.12 m/s
Height: 72 in
S' Lateral: 4.4 cm
Single Plane A4C EF: 31.7 %
Weight: 3060.8 oz

## 2022-04-04 LAB — GLUCOSE, CAPILLARY
Glucose-Capillary: 187 mg/dL — ABNORMAL HIGH (ref 70–99)
Glucose-Capillary: 275 mg/dL — ABNORMAL HIGH (ref 70–99)
Glucose-Capillary: 267 mg/dL — ABNORMAL HIGH (ref 70–99)
Glucose-Capillary: 208 mg/dL — ABNORMAL HIGH (ref 70–99)
Glucose-Capillary: 152 mg/dL — ABNORMAL HIGH (ref 70–99)
Glucose-Capillary: 166 mg/dL — ABNORMAL HIGH (ref 70–99)

## 2022-04-04 LAB — HEPARIN LEVEL (UNFRACTIONATED)
Heparin Unfractionated: <0.1 IU/mL — ABNORMAL LOW (ref 0.30–0.70)
Heparin Unfractionated: 0.51 IU/mL (ref 0.30–0.70)
Heparin Unfractionated: 0.33 IU/mL (ref 0.30–0.70)
Heparin Unfractionated: 0.22 IU/mL — ABNORMAL LOW (ref 0.30–0.70)

## 2022-04-04 LAB — TROPONIN I (HIGH SENSITIVITY)
Troponin I (High Sensitivity): 111 ng/L (ref <18)
Troponin I (High Sensitivity): 100 ng/L (ref <18)

## 2022-04-04 MED ORDER — ISOSORBIDE MONONITRATE ER 30 MG PO TB24
30.0000 mg | ORAL_TABLET | Freq: Every day | ORAL | Status: DC
  Administered 2022-04-04 – 2022-04-06 (×3): 30 mg via ORAL
  Filled 2022-04-04 (×3): qty 1

## 2022-04-04 MED ORDER — HEPARIN BOLUS VIA INFUSION
2000.0000 [IU] | Freq: Once | INTRAVENOUS | Status: AC
  Administered 2022-04-04: 2000 [IU] via INTRAVENOUS
  Filled 2022-04-04: qty 2000

## 2022-04-04 MED ORDER — HEPARIN BOLUS VIA INFUSION
1000.0000 [IU] | Freq: Once | INTRAVENOUS | Status: AC
  Administered 2022-04-04: 1000 [IU] via INTRAVENOUS
  Filled 2022-04-04: qty 1000

## 2022-04-04 MED ORDER — PERFLUTREN LIPID MICROSPHERE
1.0000 mL | INTRAVENOUS | Status: AC | PRN
  Administered 2022-04-04: 2 mL via INTRAVENOUS

## 2022-04-04 MED ORDER — NITROGLYCERIN 0.4 MG SL SUBL
0.4000 mg | SUBLINGUAL_TABLET | SUBLINGUAL | Status: DC | PRN
  Administered 2022-04-04 – 2022-04-12 (×21): 0.4 mg via SUBLINGUAL
  Filled 2022-04-04 (×18): qty 1

## 2022-04-04 MED ORDER — NITROGLYCERIN 2 % TD OINT: 1.0000 [in_us] | TOPICAL_OINTMENT | Freq: Four times a day (QID) | TRANSDERMAL | Status: DC

## 2022-04-04 NOTE — Progress Notes (Signed)
[facility] for heparin  Indication: chest pain/ACS  Allergies  Allergen Reactions   Metronidazole     Kidney Dr does not want pt to take    Doxazosin Mesylate Other (See Comments)    dizziness   Methocarbamol Rash    Patient Measurements: Height: 6' (182.9 cm) Weight: 88.5 kg (195 lb) IBW/kg (Calculated) : 77.6 Heparin Dosing Weight: 88.5kg   Vital Signs: Temp: 98.1 F (36.7 C) (12/31 0000) Temp Source: Oral (12/31 0000) BP: 128/70 (12/31 0000) Pulse Rate: 98 (12/31 0000)  Labs: Recent Labs    04/03/22 1221 04/03/22 1441 04/04/22 0028  HGB 11.5*  --  11.7*  HCT 35.3*  --  36.9*  PLT 194  --  184  HEPARINUNFRC  --   --  <0.10*  CREATININE 2.85*  --  2.91*  TROPONINIHS 123* 109*  --      Estimated Creatinine Clearance: 19.3 mL/min (A) (by C-G formula based on SCr of 2.91 mg/dL (H)).   Medical History: Past Medical History:  Diagnosis Date   Aortic stenosis    Arthritis    osteoarthritis, s/p R TKR, and digits   CAD (coronary artery disease)    a. s/p CABG (2001)  b. s/p DES to RCA and cutting POBA to ostial PDA (2013)   c. s/p DES to SVG to OM2 (01/14/14) d. cath: 08/2015 NSTEMI w/ patent LIMA-LAD and 99% stenosis of SVG-OM w/ DES placed. CTO of SVG-RCA and SVG-D1.    Cancer [facility])    bladder  and skin   Cataract    Chronic diastolic CHF (congestive heart failure) ([place])    a) 09/13 ECHO- LVEF 07-86%, grade 1 diastolic dysfunction, mild LA dilatation, atrial septal aneurysm, AV mobility restricted, but no sig AS by doppler; b) 09/04/08 ECHO- LVH, ef 60%, mild AS, c. echo 08/2015: EF perserved of 55-60% with inferolateral HK. Mild AS noted.   Chronic kidney disease, stage III (moderate) ([name])    Chronic lower back pain    Colon polyps    COVID-19    Diverticulosis    Dyspnea 2009 since July -Sept   05/06/08-CPST-  normal effort, reduced VO2 max 20.5 /65%, reduced at 8.2/ 40%, normal breathing resetvca of 55%, submaximal heart  rate response 112/77%, flattened o2 pluse response at peak exercise-12 ml/beat @ 85%, No VQ mismatch abnormalities, All c/w CIRC Limitation   Enlarged prostate    Esophageal stricture    a. s/p dilation spring 2010   GERD (gastroesophageal reflux disease)    Heart murmur    Hiatal hernia    History of carpal tunnel syndrome    Bilateral   History of kidney stones    History of PFTs    mixed pattern on spiro. mild restn on lung volumes with near normal DLCO. Pattern can be explained by CABG scar. Fev1 2.2L/73%, ratio 68 (67), TLC 4.7/68%,RV 1.5L/55%,DLCO 79%   Hyperlipidemia    Hypertension    Interstitial lung disease ([name])    NOS   Iron deficiency anemia    Myocardial infarction ([name])    Nausea & vomiting    2018/2019   On home oxygen therapy    2 L [place] at bedtime   Osteoporosis    Overweight (BMI 25.0-29.9)    BMI 29   Peripheral neuropathy    RA (rheumatoid arthritis) ([place])    [name]   Seropositive rheumatoid arthritis ([place])    Type II diabetes mellitus ([place])    diet  controlled   Wears glasses    Wears partial dentures    upper    Assessment: Patient admitted with CC of chest pain. Cardiology consulted, trop elevated to 128. Patient with CKD, hopeful to avoid cath per cards note. Patient not on anticoagulation PTA. CBC stable.  12/31 AM update:  Heparin level low  Goal of Therapy:  Heparin level 0.3-0.7 units/ml Monitor platelets by anticoagulation protocol: Yes   Plan:  Heparin 2000 units bolus Inc heparin to 1200 units/hr 8 hour heparin level  Narda Bonds, PharmD, BCPS Clinical Pharmacist Phone: [phone]

## 2022-04-04 NOTE — Progress Notes (Signed)
  Echocardiogram 2D Echocardiogram has been performed.  [name] 04/04/2022, 11:33 AM

## 2022-04-04 NOTE — Progress Notes (Signed)
[facility] for heparin  Indication: chest pain/ACS  Allergies  Allergen Reactions   Metronidazole     Kidney Dr does not want pt to take    Doxazosin Mesylate Other (See Comments)    dizziness   Methocarbamol Rash    Patient Measurements: Height: 6' (182.9 cm) Weight: 86.8 kg (191 lb 4.8 oz) IBW/kg (Calculated) : 77.6 Heparin Dosing Weight: 88.5kg   Vital Signs: Temp: 98.1 F (36.7 C) (12/31 1937) Temp Source: Oral (12/31 1937) BP: 111/73 (12/31 1937) Pulse Rate: 97 (12/31 1937)  Labs: Recent Labs    04/03/22 1221 04/03/22 1441 04/04/22 0028 04/04/22 0028 04/04/22 0721 04/04/22 0821 04/04/22 1014 04/04/22 1824  HGB 11.5*  --  11.7*  --   --   --   --   --   HCT 35.3*  --  36.9*  --   --   --   --   --   PLT 194  --  184  --   --   --   --   --   HEPARINUNFRC  --   --  <0.10*   < > 0.51  --  0.33 0.22*  CREATININE 2.85*  --  2.91*  --   --   --   --   --   TROPONINIHS 123* 109*  --   --   --  111* 100*  --    < > = values in this interval not displayed.     Estimated Creatinine Clearance: 19.3 mL/min (A) (by C-G formula based on SCr of 2.91 mg/dL (H)).   Medical History: Past Medical History:  Diagnosis Date   Aortic stenosis    Arthritis    osteoarthritis, s/p R TKR, and digits   CAD (coronary artery disease)    a. s/p CABG (2001)  b. s/p DES to RCA and cutting POBA to ostial PDA (2013)   c. s/p DES to SVG to OM2 (01/14/14) d. cath: 08/2015 NSTEMI w/ patent LIMA-LAD and 99% stenosis of SVG-OM w/ DES placed. CTO of SVG-RCA and SVG-D1.    Cancer [facility])    bladder  and skin   Cataract    Chronic diastolic CHF (congestive heart failure) ([place])    a) 09/13 ECHO- LVEF 28-36%, grade 1 diastolic dysfunction, mild LA dilatation, atrial septal aneurysm, AV mobility restricted, but no sig AS by doppler; b) 09/04/08 ECHO- LVH, ef 60%, mild AS, c. echo 08/2015: EF perserved of 55-60% with inferolateral HK. Mild AS noted.   Chronic  kidney disease, stage III (moderate) ([name])    Chronic lower back pain    Colon polyps    COVID-19    Diverticulosis    Dyspnea 2009 since July -Sept   05/06/08-CPST-  normal effort, reduced VO2 max 20.5 /65%, reduced at 8.2/ 40%, normal breathing resetvca of 55%, submaximal heart rate response 112/77%, flattened o2 pluse response at peak exercise-12 ml/beat @ 85%, No VQ mismatch abnormalities, All c/w CIRC Limitation   Enlarged prostate    Esophageal stricture    a. s/p dilation spring 2010   GERD (gastroesophageal reflux disease)    Heart murmur    Hiatal hernia    History of carpal tunnel syndrome    Bilateral   History of kidney stones    History of PFTs    mixed pattern on spiro. mild restn on lung volumes with near normal DLCO. Pattern can be explained by CABG scar. Fev1 2.2L/73%, ratio 68 (67), TLC  4.7/68%,RV 1.5L/55%,DLCO 79%   Hyperlipidemia    Hypertension    Interstitial lung disease ([name])    NOS   Iron deficiency anemia    Myocardial infarction ([name])    Nausea & vomiting    2018/2019   On home oxygen therapy    2 L [place] at bedtime   Osteoporosis    Overweight (BMI 25.0-29.9)    BMI 29   Peripheral neuropathy    RA (rheumatoid arthritis) ([place])    [name]   Seropositive rheumatoid arthritis ([place])    Type II diabetes mellitus ([place])    diet controlled   Wears glasses    Wears partial dentures    upper    Assessment: Patient admitted with CC of chest pain. Cardiology consulted, trop elevated to 128. Patient with CKD, hopeful to avoid cath per cards note. Patient not on anticoagulation PTA.  Heparin level subtherapeutic at 0.22 on 1200 units/hr  Goal of Therapy:  Heparin level 0.3-0.7 units/ml Monitor platelets by anticoagulation protocol: Yes   Plan:  -heparin bolus 1000 units then increase to 1350 units/hr -heparin level and CBC in am  Hildred Laser, PharmD Clinical Pharmacist **Pharmacist phone directory can now be found on [email or web address] ([email or web address]).  Listed  under [place].

## 2022-04-04 NOTE — Progress Notes (Signed)
Rounding Note    Patient Name: [name] Date of Encounter: 04/04/2022  [place] Cardiologist: [name], MD   Subjective   Chest discomfort.  Inpatient Medications    Scheduled Meds:  aspirin EC  81 mg Oral QHS   clopidogrel  75 mg Oral Daily   finasteride  5 mg Oral Daily   furosemide  40 mg Intravenous BID   guaiFENesin  600 mg Oral BID   insulin aspart  0-15 Units Subcutaneous TID WC   insulin aspart  0-5 Units Subcutaneous QHS   nortriptyline  10-20 mg Oral QHS   pantoprazole  40 mg Oral Daily   pregabalin  50 mg Oral QHS   rosuvastatin  10 mg Oral Daily   sodium chloride flush  3 mL Intravenous Q12H   tamsulosin  0.4 mg Oral QHS   umeclidinium bromide  1 puff Inhalation Daily   Continuous Infusions:  azithromycin     cefTRIAXone (ROCEPHIN)  IV     heparin 1,200 Units/hr (04/04/22 0330)   PRN Meds: acetaminophen **OR** acetaminophen, albuterol, hydrALAZINE, HYDROcodone-acetaminophen, nitroGLYCERIN   Vital Signs    Vitals:   04/04/22 0806 04/04/22 0808 04/04/22 0819 04/04/22 0942  BP: 135/75 127/83 (!) 95/59   Pulse: (!) 113 (!) 111 (!) 112   Resp: 17     Temp: 98.1 F (36.7 C)     TempSrc: Oral     SpO2: 95% 96% (!) 89% 96%  Weight:      Height:        Intake/Output Summary (Last 24 hours) at 04/04/2022 1023 Last data filed at 04/04/2022 0700 Gross per 24 hour  Intake 521.63 ml  Output 900 ml  Net -378.37 ml      04/04/2022    6:55 AM 04/03/2022   12:26 PM 03/22/2022   12:56 PM  Last 3 Weights  Weight (lbs) 191 lb 4.8 oz 195 lb 195 lb  Weight (kg) 86.773 kg 88.451 kg 88.451 kg      Telemetry    Sinus tachy 101 - Personally Reviewed  ECG    As below - Personally Reviewed  Physical Exam   GEN: No acute distress.  Ple[place] Neck: No JVD Cardiac: RRR, 2/6 systolic murmur, rubs, or gallops.  Respiratory: Clear to auscultation bilaterally. GI: Soft, nontender, non-distended  MS: No edema; No  deformity. Neuro:  Nonfocal  Psych: Normal affect   Labs    High Sensitivity Troponin:   Recent Labs  Lab 04/03/22 1221 04/03/22 1441 04/04/22 0821  TROPONINIHS 123* 109* 111*     Chemistry Recent Labs  Lab 04/03/22 1221 04/04/22 0028  NA 131* 134*  K 5.4* 4.8  CL 97* 99  CO2 27 22  GLUCOSE 304* 163*  BUN 45* 47*  CREATININE 2.85* 2.91*  CALCIUM 9.3 9.4  GFRNONAA 21* 20*  ANIONGAP 7 13    Lipids No results for input(s): "CHOL", "TRIG", "HDL", "LABVLDL", "LDLCALC", "CHOLHDL" in the last 168 hours.  Hematology Recent Labs  Lab 04/03/22 1221 04/04/22 0028  WBC 10.6* 10.8*  RBC 4.02* 4.20*  HGB 11.5* 11.7*  HCT 35.3* 36.9*  MCV 87.8 87.9  MCH 28.6 27.9  MCHC 32.6 31.7  RDW 14.3 14.4  PLT 194 184   Thyroid No results for input(s): "TSH", "FREET4" in the last 168 hours.  BNP Recent Labs  Lab 04/03/22 1221  BNP 447.8*    DDimer No results for input(s): "DDIMER" in the last 168 hours.   Radiology  DG Chest Port 1 View  Result Date: 04/03/2022 CLINICAL DATA:  Chest pain EXAM: PORTABLE CHEST 1 VIEW COMPARISON:  Previous chest radiograph done on 01/27/2021, CT chest done on 03/04/2021 FINDINGS: Transverse diameter of heart is slightly increased. There is previous coronary bypass surgery. Central pulmonary vessels are prominent. Increased interstitial markings are seen in parahilar regions and lower lung fields. There is minimal blunting of right lateral CP angle. There is no pneumothorax. IMPRESSION: Increased interstitial markings are seen in right parahilar region and both lower lung fields. Findings suggest possible multifocal pneumonia. Part of this finding may be due to asymmetric interstitial edema. Minimal right pleural effusion. Electronically Signed   By: Elmer Picker M.D.   On: 04/03/2022 13:10     Cardiac Catheterization 08/13/15 LM ost 80 LAD prox 100 OM2 90 RCA prox 100 L-LAD ok S-OM1/OM2 prox 99 ISR S-RCA 100 (CTO) S-D1 prox 100  (CTO) PCI:  3.5 x 12 mm Resolute DES to S-OM1/OM2   Diagnostic Dominance: Right  Intervention       Patient Profile     88 y.o. male with chronic kidney disease creatinine approximately 2 at baseline with known coronary artery disease post bypass in 2001 with most recent stent placement to SVG OM.  Has occluded SVG to RCA and SVG to D1.  Here with non-ST elevation myocardial infarction.  Assessment & Plan    Non-ST elevation myocardial infarction - Given his advanced chronic kidney disease, we will treat medically.  We are concerned about the potential for worsening renal function in the setting of coronary intervention.  His brother had hemodialysis and decided to stop on his own volition because of poor lifestyle he states.  He does not wish to go in this direction. - It is likely that his SVG to OM graft has once again restenosed.  His troponin however is only mildly elevated ranging from 123 down to 111.  Prior to this admission his troponins have been mildly elevated chronically at around 40-70. -EKG shows ST depression V3 through V6.  This finding is new when compared to prior ECG. -I will give him isosorbide 30 mg a day.  He seems to recall taking this medication in the past.  I warned him of potential headache.  We will discontinue the nitroglycerin paste.  Ischemic cardiomyopathy - Most recent echocardiogram February 01, 2022 showed EF of 40 to 45% with basal inferior akinesis.  Received IV Lasix.  Aortic stenosis - Mild to moderate, 14 mmHg.  Chronic kidney disease stage IV - Follows with nephrology, Dr. Royce Macadamia as outpatient.  Chronic pain - Recent visit with Dr. Silvio Pate noted increasing problems with pain and request to restart chronic narcotic therapy.  Anticipate stopping the IV heparin tomorrow morning.  If feeling better, anticipate discharge tomorrow.  He is eager to try to go home.    For questions or updates, please contact [place] Please consult  [email or web address] for contact info under        Signed, Candee Furbish, MD  04/04/2022, 10:23 AM

## 2022-04-04 NOTE — Progress Notes (Signed)
[facility] for heparin  Indication: chest pain/ACS  Allergies  Allergen Reactions   Metronidazole     Kidney Dr does not want pt to take    Doxazosin Mesylate Other (See Comments)    dizziness   Methocarbamol Rash    Patient Measurements: Height: 6' (182.9 cm) Weight: 86.8 kg (191 lb 4.8 oz) IBW/kg (Calculated) : 77.6 Heparin Dosing Weight: 88.5kg   Vital Signs: Temp: 98.1 F (36.7 C) (12/31 0806) Temp Source: Oral (12/31 0806) BP: 95/59 (12/31 0819) Pulse Rate: 112 (12/31 0819)  Labs: Recent Labs    04/03/22 1221 04/03/22 1441 04/04/22 0028 04/04/22 0721 04/04/22 0821 04/04/22 1014  HGB 11.5*  --  11.7*  --   --   --   HCT 35.3*  --  36.9*  --   --   --   PLT 194  --  184  --   --   --   HEPARINUNFRC  --   --  <0.10* 0.51  --  0.33  CREATININE 2.85*  --  2.91*  --   --   --   TROPONINIHS 123* 109*  --   --  111*  --      Estimated Creatinine Clearance: 19.3 mL/min (A) (by C-G formula based on SCr of 2.91 mg/dL (H)).   Medical History: Past Medical History:  Diagnosis Date   Aortic stenosis    Arthritis    osteoarthritis, s/p R TKR, and digits   CAD (coronary artery disease)    a. s/p CABG (2001)  b. s/p DES to RCA and cutting POBA to ostial PDA (2013)   c. s/p DES to SVG to OM2 (01/14/14) d. cath: 08/2015 NSTEMI w/ patent LIMA-LAD and 99% stenosis of SVG-OM w/ DES placed. CTO of SVG-RCA and SVG-D1.    Cancer [facility])    bladder  and skin   Cataract    Chronic diastolic CHF (congestive heart failure) ([place])    a) 09/13 ECHO- LVEF 62-69%, grade 1 diastolic dysfunction, mild LA dilatation, atrial septal aneurysm, AV mobility restricted, but no sig AS by doppler; b) 09/04/08 ECHO- LVH, ef 60%, mild AS, c. echo 08/2015: EF perserved of 55-60% with inferolateral HK. Mild AS noted.   Chronic kidney disease, stage III (moderate) ([name])    Chronic lower back pain    Colon polyps    COVID-19    Diverticulosis    Dyspnea 2009 since  July -Sept   05/06/08-CPST-  normal effort, reduced VO2 max 20.5 /65%, reduced at 8.2/ 40%, normal breathing resetvca of 55%, submaximal heart rate response 112/77%, flattened o2 pluse response at peak exercise-12 ml/beat @ 85%, No VQ mismatch abnormalities, All c/w CIRC Limitation   Enlarged prostate    Esophageal stricture    a. s/p dilation spring 2010   GERD (gastroesophageal reflux disease)    Heart murmur    Hiatal hernia    History of carpal tunnel syndrome    Bilateral   History of kidney stones    History of PFTs    mixed pattern on spiro. mild restn on lung volumes with near normal DLCO. Pattern can be explained by CABG scar. Fev1 2.2L/73%, ratio 68 (67), TLC 4.7/68%,RV 1.5L/55%,DLCO 79%   Hyperlipidemia    Hypertension    Interstitial lung disease ([name])    NOS   Iron deficiency anemia    Myocardial infarction ([name])    Nausea & vomiting    2018/2019   On home oxygen therapy  2 L [place] at bedtime   Osteoporosis    Overweight (BMI 25.0-29.9)    BMI 29   Peripheral neuropathy    RA (rheumatoid arthritis) ([place])    [name]   Seropositive rheumatoid arthritis ([place])    Type II diabetes mellitus ([place])    diet controlled   Wears glasses    Wears partial dentures    upper    Assessment: Patient admitted with CC of chest pain. Cardiology consulted, trop elevated to 128. Patient with CKD, hopeful to avoid cath per cards note. Patient not on anticoagulation PTA.  Heparin level therapeutic at 0.33. CBC stable. No issues with infusion or bleeding noted.   Goal of Therapy:  Heparin level 0.3-0.7 units/ml Monitor platelets by anticoagulation protocol: Yes   Plan:  Continue heparin 1200 units/hr 8 hour heparin level Check CBC, heparin level daily  Monitor for s/sx of bleeding   Francena Hanly, PharmD Pharmacy Resident  04/04/2022 11:03 AM

## 2022-04-04 NOTE — Progress Notes (Signed)
PROGRESS NOTE    [name]  GDJ:[id number] DOB: Oct 26, 1931 DOA: 04/03/2022 PCP: [name], MD   Brief Narrative:    [name] is a 88 y.o. male with medical history significant of diastolic CHF, CAD, interstitial lung disease, nocturnal hypoxemia on 2 L of nasal cannula oxygen, bladder cancer, rheumatoid arthritis, and CKD stage IV presents with complaints of chest pain over the last 2 days.  He has been admitted for chest pain evaluation with concerns for unstable angina in the setting of elevated troponin with prior history of significant CAD with CABG x 5 and multiple PCI's.  He is also noted to have possible community-acquired pneumonia and has been started on Rocephin and azithromycin.  2D echocardiogram pending, appreciate cardiology consultation with plans to start Imdur today given chest pain.  Plan to discontinue IV heparin drip in a.m. with potential discharge at that point.  Assessment & Plan:   Principal Problem:   Elevated troponin Active Problems:   Atherosclerotic heart disease of native coronary artery with angina pectoris ([name])   Respiratory failure, chronic ([name])   CAP (community acquired pneumonia)   Heart failure with reduced ejection fraction ([name])   Leukocytosis   Hyperkalemia   Acute kidney injury superimposed on chronic kidney disease ([place])   Uncontrolled type 2 diabetes mellitus with hyperglycemia ([name])   ILD (interstitial lung disease) ([name])   GERD   History of bladder cancer  Assessment and Plan:  Elevated troponin CAD s/p CABG  Acute.   Patient presents with complaints of chest pain with exertion concerning for unstable angina.  High-sensitivity troponin elevated at 123.  Patient with prior history of CABG back in 2001 and multiple prior PCI's.  Last cardiac catheterization from 08/2015 noted patent LIMA- LAD, occluded SVG-RCA, SVG to diagonal with PCI/DES of SVG to OM for severe stenosis of proximal body of graft for in-stent restenosis  and recommended life long DAPT with ASA/plavix.  Patient has been started on -Admit to a cardiac telemetry bed -Continue to trend high-sensitivity troponin. -Heparin drip per pharmacy -Continue aspirin, Plavix, and  statin -Follow-up echocardiogram -Appreciate cardiology consultative services with plan to continue heparin drip through today and anticipate discharge in a.m. if stable.   Possible community-acquired pneumonia chronic respiratory failure with hypoxemia Patient requiring 2 L nasal cannula oxygen continuously but normally only on 2 L of nasal cannula oxygen at night.  Reports that he has had a cough.  Chest x-ray concerning for either  multifocal pneumonia and/or asymmetric pulmonary edema.  On differential also includes the possibility of aspiration given prior history of achalasia and dysphagia. -Continue nasal cannula oxygen maintain O2 saturation greater than 92% -Follow-up influenza, COVID-19, and RSV screening -Procalcitonin not significantly elevated -Continue Rocephin and azithromycin.  De-escalate to oral on discharge   Heart failure with reduced EF Suspect acute on chronic.  On physical exam patient does not appear grossly fluid overloaded.  Had been told to stop Lasix last week unless weight greater than 195.  BNP currently elevated at 447.8.  Last echocardiogram noted EF to be 40 to 45% with grade 1 diastolic dysfunction 62/2297.   -Strict I&Os and daily weights -Follow-up echocardiogram -Lasix 40 mg IV x 2.  Reassess and determine if need of further diuresis in a.m.   Leukocytosis Acute.  WBC elevated 10.6.  Question if this is reactive in nature or secondary to acute infection. -Procalcitonin is not significantly elevated, but continue antibiotics for now and monitor    Acute kidney  injury superimposed on chronic kidney disease stage IiV On admission creatinine elevated up to 2.85 with BUN 45.  Creatinine previously noted to be 2.35 on 10/30. -Continues to have  some creatinine elevation due to IV Lasix with adequate urine output noted; discontinue IV Lasix today -Follows with Dr. Royce Macadamia outpatient   Diabetes mellitus type 2 with hyperglycemia, without long-term use of insulin Glucose elevated at 304.  Last hemoglobin A1c 8.7 in 01/18/2022.  Home medications only include glipizide and is not on insulin. -Hypoglycemia protocol -Hold glipizide -CBGs before every meal with moderate SSI -NovoLog 0 - 5 units nightly -Adjust insulin regimen as needed -Blood glucose levels improved   Bronchiectasis Patient followed by Dr. Estanislado Pandy of rheumatology and Dr. Chase Caller of pulmonology in the outpatient setting for bronchiectasis and mild interstitial lung disease in the setting [place] arthritis. -Continue outpatient follow-up    Essential hypertension -Currently with soft blood pressure readings, started on Imdur today per cardiology   History of bladder cancer -Continue outpatient follow-up with urology   History of seropositive rheumatoid arthritis   Hyperlipidemia -Continue Crestor   GERD -Continue Protonix    DVT prophylaxis:Heparin drip Code Status: Full Family Communication: None at bedside Disposition Plan: Per Cardiology Status is: Inpatient Remains inpatient appropriate because: Need for ongoing IV medication.   Consultants:  Cardiology  Procedures:  None  Antimicrobials:  Anti-infectives (From admission, onward)    Start     Dose/Rate Route Frequency Ordered Stop   04/04/22 1400  cefTRIAXone (ROCEPHIN) 2 g in sodium chloride 0.9 % 100 mL IVPB        2 g 200 mL/hr over 30 Minutes Intravenous Every 24 hours 04/03/22 1606 04/08/22 1359   04/04/22 1400  azithromycin (ZITHROMAX) 500 mg in sodium chloride 0.9 % 250 mL IVPB        500 mg 250 mL/hr over 60 Minutes Intravenous Every 24 hours 04/03/22 1606 04/08/22 1359   04/03/22 1345  cefTRIAXone (ROCEPHIN) 1 g in sodium chloride 0.9 % 100 mL IVPB        1 g 200 mL/hr over 30  Minutes Intravenous  Once 04/03/22 1338 04/03/22 1546   04/03/22 1345  azithromycin (ZITHROMAX) 500 mg in sodium chloride 0.9 % 250 mL IVPB        500 mg 250 mL/hr over 60 Minutes Intravenous  Once 04/03/22 1338 04/03/22 2236      Subjective: Patient seen and evaluated today with noted complaints of chest pain this morning that has improved with the use of nitroglycerin.  Objective: Vitals:   04/03/22 2030 04/04/22 0000 04/04/22 0400 04/04/22 0655  BP:  128/70 110/85   Pulse:  98 91   Resp: 20 18    Temp: 98 F (36.7 C) 98.1 F (36.7 C) (!) 97.5 F (36.4 C)   TempSrc: Oral Oral Oral   SpO2:  96% 94%   Weight:    86.8 kg  Height:        Intake/Output Summary (Last 24 hours) at 04/04/2022 0805 Last data filed at 04/04/2022 0700 Gross per 24 hour  Intake 521.63 ml  Output 900 ml  Net -378.37 ml   Filed Weights   04/03/22 1226 04/04/22 0655  Weight: 88.5 kg 86.8 kg    Examination:  General exam: Appears calm and comfortable  Respiratory system: Clear to auscultation. Respiratory effort normal.  Currently on 3 L nasal cannula Cardiovascular system: S1 & S2 heard, RRR.  Gastrointestinal system: Abdomen is soft Central nervous system: Alert and awake  Extremities: No edema Skin: No significant lesions noted Psychiatry: Flat affect.    Data Reviewed: I have personally reviewed following labs and imaging studies  CBC: Recent Labs  Lab 04/03/22 1221 04/04/22 0028  WBC 10.6* 10.8*  HGB 11.5* 11.7*  HCT 35.3* 36.9*  MCV 87.8 87.9  PLT 194 462   Basic Metabolic Panel: Recent Labs  Lab 04/03/22 1221 04/04/22 0028  NA 131* 134*  K 5.4* 4.8  CL 97* 99  CO2 27 22  GLUCOSE 304* 163*  BUN 45* 47*  CREATININE 2.85* 2.91*  CALCIUM 9.3 9.4   GFR: Estimated Creatinine Clearance: 19.3 mL/min (A) (by C-G formula based on SCr of 2.91 mg/dL (H)). Liver Function Tests: No results for input(s): "AST", "ALT", "ALKPHOS", "BILITOT", "PROT", "ALBUMIN" in the last 168  hours. No results for input(s): "LIPASE", "AMYLASE" in the last 168 hours. No results for input(s): "AMMONIA" in the last 168 hours. Coagulation Profile: No results for input(s): "INR", "PROTIME" in the last 168 hours. Cardiac Enzymes: No results for input(s): "CKTOTAL", "CKMB", "CKMBINDEX", "TROPONINI" in the last 168 hours. BNP (last 3 results) No results for input(s): "PROBNP" in the last 8760 hours. HbA1C: No results for input(s): "HGBA1C" in the last 72 hours. CBG: Recent Labs  Lab 04/03/22 1226 04/03/22 1750 04/03/22 2102 04/04/22 0802  GLUCAP 293* 212* 146* 187*   Lipid Profile: No results for input(s): "CHOL", "HDL", "LDLCALC", "TRIG", "CHOLHDL", "LDLDIRECT" in the last 72 hours. Thyroid Function Tests: No results for input(s): "TSH", "T4TOTAL", "FREET4", "T3FREE", "THYROIDAB" in the last 72 hours. Anemia Panel: No results for input(s): "VITAMINB12", "FOLATE", "FERRITIN", "TIBC", "IRON", "RETICCTPCT" in the last 72 hours. Sepsis Labs: Recent Labs  Lab 04/03/22 1441 04/03/22 1633  PROCALCITON  --  0.19  LATICACIDVEN 1.7 1.2    Recent Results (from the past 240 hour(s))  Culture, blood (routine x 2)     Status: None (Preliminary result)   Collection Time: 04/03/22  2:41 PM   Specimen: BLOOD RIGHT HAND  Result Value Ref Range Status   Specimen Description BLOOD RIGHT HAND  Final   Special Requests   Final    BOTTLES DRAWN AEROBIC AND ANAEROBIC Blood Culture results may not be optimal due to an inadequate volume of blood received in culture bottles   Culture   Final    NO GROWTH < 24 HOURS Performed at [facility], [place] [address] [place], [place] [address]    Report Status PENDING  Incomplete  Resp panel by RT-PCR (RSV, Flu A&B, Covid) Anterior Nasal Swab     Status: None   Collection Time: 04/03/22  3:00 PM   Specimen: Anterior Nasal Swab  Result Value Ref Range Status   SARS Coronavirus 2 by RT PCR NEGATIVE NEGATIVE Final    Comment:  (NOTE) SARS-CoV-2 target nucleic acids are NOT DETECTED.  The SARS-CoV-2 RNA is generally detectable in upper respiratory specimens during the acute phase of infection. The lowest concentration of SARS-CoV-2 viral copies this assay can detect is 138 copies/mL. A negative result does not preclude SARS-Cov-2 infection and should not be used as the sole basis for treatment or other patient management decisions. A negative result may occur with  improper specimen collection/handling, submission of specimen other than nasopharyngeal swab, presence of viral mutation(s) within the areas targeted by this assay, and inadequate number of viral copies(<138 copies/mL). A negative result must be combined with clinical observations, patient history, and epidemiological information. The expected result is Negative.  Fact Sheet for  Patients:  EntrepreneurPulse.com.au  Fact Sheet for Healthcare Providers:  IncredibleEmployment.be  This test is no t yet approved or cleared by the [place] FDA and  has been authorized for detection and/or diagnosis of SARS-CoV-2 by FDA under an Emergency Use Authorization (EUA). This EUA will remain  in effect (meaning this test can be used) for the duration of the COVID-19 declaration under Section 564(b)(1) of the Act, 21 U.S.C.section 360bbb-3(b)(1), unless the authorization is terminated  or revoked sooner.       Influenza A by PCR NEGATIVE NEGATIVE Final   Influenza B by PCR NEGATIVE NEGATIVE Final    Comment: (NOTE) The Xpert Xpress SARS-CoV-2/FLU/RSV plus assay is intended as an aid in the diagnosis of influenza from Nasopharyngeal swab specimens and should not be used as a sole basis for treatment. Nasal washings and aspirates are unacceptable for Xpert Xpress SARS-CoV-2/FLU/RSV testing.  Fact Sheet for Patients: EntrepreneurPulse.com.au  Fact Sheet for Healthcare  Providers: IncredibleEmployment.be  This test is not yet approved or cleared by the [place] FDA and has been authorized for detection and/or diagnosis of SARS-CoV-2 by FDA under an Emergency Use Authorization (EUA). This EUA will remain in effect (meaning this test can be used) for the duration of the COVID-19 declaration under Section 564(b)(1) of the Act, 21 U.S.C. section 360bbb-3(b)(1), unless the authorization is terminated or revoked.     Resp Syncytial Virus by PCR NEGATIVE NEGATIVE Final    Comment: (NOTE) Fact Sheet for Patients: EntrepreneurPulse.com.au  Fact Sheet for Healthcare Providers: IncredibleEmployment.be  This test is not yet approved or cleared by the [place] FDA and has been authorized for detection and/or diagnosis of SARS-CoV-2 by FDA under an Emergency Use Authorization (EUA). This EUA will remain in effect (meaning this test can be used) for the duration of the COVID-19 declaration under Section 564(b)(1) of the Act, 21 U.S.C. section 360bbb-3(b)(1), unless the authorization is terminated or revoked.  Performed at [facility], [place] [name] [place], [place] [address]   Culture, blood (routine x 2)     Status: None (Preliminary result)   Collection Time: 04/03/22  4:33 PM   Specimen: BLOOD  Result Value Ref Range Status   Specimen Description BLOOD RIGHT ANTECUBITAL  Final   Special Requests   Final    BOTTLES DRAWN AEROBIC ONLY Blood Culture results may not be optimal due to an inadequate volume of blood received in culture bottles   Culture   Final    NO GROWTH < 12 HOURS Performed at [facility], [place] [address], [place], [place] [address]    Report Status PENDING  Incomplete         Radiology Studies: DG Chest Port 1 View  Result Date: 04/03/2022 CLINICAL DATA:  Chest pain EXAM: PORTABLE CHEST 1 VIEW COMPARISON:  Previous chest radiograph done on  01/27/2021, CT chest done on 03/04/2021 FINDINGS: Transverse diameter of heart is slightly increased. There is previous coronary bypass surgery. Central pulmonary vessels are prominent. Increased interstitial markings are seen in parahilar regions and lower lung fields. There is minimal blunting of right lateral CP angle. There is no pneumothorax. IMPRESSION: Increased interstitial markings are seen in right parahilar region and both lower lung fields. Findings suggest possible multifocal pneumonia. Part of this finding may be due to asymmetric interstitial edema. Minimal right pleural effusion. Electronically Signed   By: Elmer Picker M.D.   On: 04/03/2022 13:10        Scheduled Meds:  aspirin EC  81 mg Oral QHS   clopidogrel  75 mg Oral Daily   finasteride  5 mg Oral Daily   furosemide  40 mg Intravenous BID   guaiFENesin  600 mg Oral BID   insulin aspart  0-15 Units Subcutaneous TID WC   insulin aspart  0-5 Units Subcutaneous QHS   nortriptyline  10-20 mg Oral QHS   pantoprazole  40 mg Oral Daily   pregabalin  50 mg Oral QHS   rosuvastatin  10 mg Oral Daily   sodium chloride flush  3 mL Intravenous Q12H   tamsulosin  0.4 mg Oral QHS   umeclidinium bromide  1 puff Inhalation Daily   Continuous Infusions:  azithromycin     cefTRIAXone (ROCEPHIN)  IV     heparin 1,200 Units/hr (04/04/22 0330)     LOS: 1 day    Time spent: 35 minutes    Devine Dant Darleen Crocker, DO [facility]  If 7PM-7AM, please contact night-coverage [email or web address] 04/04/2022, 8:05 AM

## 2022-04-05 DIAGNOSIS — R7989 Other specified abnormal findings of blood chemistry: Secondary | ICD-10-CM | POA: Diagnosis not present

## 2022-04-05 LAB — CBC
HCT: 32.9 % — ABNORMAL LOW (ref 39.0–52.0)
Hemoglobin: 11.2 g/dL — ABNORMAL LOW (ref 13.0–17.0)
MCH: 29.2 pg (ref 26.0–34.0)
MCHC: 34 g/dL (ref 30.0–36.0)
MCV: 85.7 fL (ref 80.0–100.0)
Platelets: 190 10*3/uL (ref 150–400)
RBC: 3.84 MIL/uL — ABNORMAL LOW (ref 4.22–5.81)
RDW: 14.1 % (ref 11.5–15.5)
WBC: 9.7 10*3/uL (ref 4.0–10.5)
nRBC: 0 % (ref 0.0–0.2)

## 2022-04-05 LAB — GLUCOSE, CAPILLARY
Glucose-Capillary: 174 mg/dL — ABNORMAL HIGH (ref 70–99)
Glucose-Capillary: 224 mg/dL — ABNORMAL HIGH (ref 70–99)
Glucose-Capillary: 202 mg/dL — ABNORMAL HIGH (ref 70–99)
Glucose-Capillary: 167 mg/dL — ABNORMAL HIGH (ref 70–99)

## 2022-04-05 LAB — MAGNESIUM: Magnesium: 2 mg/dL (ref 1.7–2.4)

## 2022-04-05 LAB — HEPARIN LEVEL (UNFRACTIONATED): Heparin Unfractionated: 0.29 IU/mL — ABNORMAL LOW (ref 0.30–0.70)

## 2022-04-05 LAB — BASIC METABOLIC PANEL
Anion gap: 12 (ref 5–15)
BUN: 58 mg/dL — ABNORMAL HIGH (ref 8–23)
CO2: 23 mmol/L (ref 22–32)
Calcium: 9 mg/dL (ref 8.9–10.3)
Chloride: 97 mmol/L — ABNORMAL LOW (ref 98–111)
Creatinine, Ser: 3.29 mg/dL — ABNORMAL HIGH (ref 0.61–1.24)
GFR, Estimated: 17 mL/min — ABNORMAL LOW (ref >60)
Glucose, Bld: 165 mg/dL — ABNORMAL HIGH (ref 70–99)
Potassium: 4.3 mmol/L (ref 3.5–5.1)
Sodium: 132 mmol/L — ABNORMAL LOW (ref 135–145)

## 2022-04-05 MED ORDER — HYDROXYZINE HCL 25 MG PO TABS
25.0000 mg | ORAL_TABLET | Freq: Three times a day (TID) | ORAL | Status: DC | PRN
  Administered 2022-04-05 – 2022-04-07 (×5): 25 mg via ORAL
  Filled 2022-04-05 (×5): qty 1

## 2022-04-05 MED ORDER — METOPROLOL SUCCINATE ER 25 MG PO TB24
12.5000 mg | ORAL_TABLET | Freq: Every day | ORAL | Status: DC
  Administered 2022-04-05: 12.5 mg via ORAL
  Filled 2022-04-05: qty 1

## 2022-04-05 MED ORDER — METOPROLOL TARTRATE 5 MG/5ML IV SOLN
5.0000 mg | Freq: Once | INTRAVENOUS | Status: AC
  Administered 2022-04-05: 5 mg via INTRAVENOUS
  Filled 2022-04-05: qty 5

## 2022-04-05 NOTE — Progress Notes (Signed)
PROGRESS NOTE    KAYRON HICKLIN  UXN:[id number] DOB: 1931/10/22 DOA: 04/03/2022 PCP: [name], MD   Brief Narrative:    DARRON STUCK is a 89 y.o. male with medical history significant of diastolic CHF, CAD, interstitial lung disease, nocturnal hypoxemia on 2 L of nasal cannula oxygen, bladder cancer, rheumatoid arthritis, and CKD stage IV presents with complaints of chest pain over the last 2 days.  He has been admitted for chest pain evaluation with concerns for unstable angina in the setting of elevated troponin with prior history of significant CAD with CABG x 5 and multiple PCI's.  He is also noted to have possible community-acquired pneumonia and has been started on Rocephin and azithromycin.  2D echocardiogram pending, appreciate cardiology consultation with plans to start Imdur today given chest pain.  Plan to discontinue IV heparin drip in a.m.  This AM developed respiratory distress. Discussed with daughter that I think he needs a palliative care consult.   Assessment & Plan:   Principal Problem:   Elevated troponin Active Problems:   Atherosclerotic heart disease of native coronary artery with angina pectoris ([name])   Respiratory failure, chronic ([name])   CAP (community acquired pneumonia)   Heart failure with reduced ejection fraction ([name])   Leukocytosis   Hyperkalemia   Acute kidney injury superimposed on chronic kidney disease ([place])   Uncontrolled type 2 diabetes mellitus with hyperglycemia ([name])   ILD (interstitial lung disease) ([name])   GERD   History of bladder cancer  Assessment and Plan:  Elevated troponin CAD s/p CABG  Acute.   Patient presents with complaints of chest pain with exertion concerning for unstable angina.  High-sensitivity troponin elevated at 123.  Patient with prior history of CABG back in 2001 and multiple prior PCI's.  Last cardiac catheterization from 08/2015 noted patent LIMA- LAD, occluded SVG-RCA, SVG to diagonal with PCI/DES of SVG  to OM for severe stenosis of proximal body of graft for in-stent restenosis and recommended life long DAPT with ASA/plavix.  Patient has been started on -Admit to a cardiac telemetry bed -Continue to trend high-sensitivity troponin. -Heparin drip stopped -Continue aspirin, Plavix, and  statin -Appreciate cardiology: We had discussion that he is at increased risk for morbidity mortality. Continue with isosorbide 30 mg as antianginal as well. No room for Entresto. Kidney function does not allow for Jardiance or Farxiga. No room for spironolactone either.    Possible community-acquired pneumonia chronic respiratory failure with hypoxemia Patient requiring 2 L nasal cannula oxygen continuously but normally only on 2 L of nasal cannula oxygen at night.  Reports that he has had a cough.  Chest x-ray concerning for either  multifocal pneumonia and/or asymmetric pulmonary edema.  On differential also includes the possibility of aspiration given prior history of achalasia and dysphagia. -Continue nasal cannula oxygen maintain O2 saturation greater than 92% -Follow-up influenza, COVID-19, and RSV screening -Procalcitonin not significantly elevated -Continue Rocephin and azithromycin.  De-escalate to oral on discharge   Heart failure with reduced EF Suspect acute on chronic.  On physical exam patient does not appear grossly fluid overloaded.  Had been told to stop Lasix last week unless weight greater than 195.  BNP currently elevated at 447.8.  Last echocardiogram noted EF to be 40 to 45% with grade 1 diastolic dysfunction 25/4270.   -Strict I&Os and daily weights -see above   Leukocytosis Acute.  WBC elevated 10.6.  Question if this is reactive in nature or secondary to acute infection. -Procalcitonin  is not significantly elevated, but continue antibiotics for now and monitor    Acute kidney injury superimposed on chronic kidney disease stage IiV On admission creatinine elevated up to 2.85 with BUN  45.  Creatinine previously noted to be 2.35 on 10/30. -Continues to have some creatinine elevation due to IV Lasix with adequate urine output noted; discontinue IV Lasix today -Follows with Dr. Royce Macadamia outpatient   Diabetes mellitus type 2 with hyperglycemia, without long-term use of insulin Glucose elevated at 304.  Last hemoglobin A1c 8.7 in 01/18/2022.  Home medications only include glipizide and is not on insulin. -Hypoglycemia protocol -Hold glipizide -CBGs before every meal with moderate SSI -NovoLog 0 - 5 units nightly -Adjust insulin regimen as needed -Blood glucose levels improved   Bronchiectasis Patient followed by Dr. Estanislado Pandy of rheumatology and Dr. Chase Caller of pulmonology in the outpatient setting for bronchiectasis and mild interstitial lung disease in the setting [place] arthritis. -Continue outpatient follow-up  -aggressive pulm toilet   Essential hypertension -Currently with soft blood pressure readings, started on Imdur today per cardiology   History of bladder cancer -Continue outpatient follow-up with urology   History of seropositive rheumatoid arthritis   Hyperlipidemia -Continue Crestor   GERD -Continue Protonix    DVT prophylaxis:Heparin drip Code Status: Full Family Communication:called daughter Disposition Plan: Per Cardiology Status is: Inpatient Remains inpatient appropriate because: Need palliative care    Consultants:  Cardiology Palliative care  Subjective: In respiratory distress after having breathing treatment  Objective: Vitals:   04/04/22 1937 04/05/22 0609 04/05/22 0824 04/05/22 0845  BP: 111/73 125/77  136/80  Pulse: 97 100  (!) 111  Resp: '18 20  20  '$ Temp: 98.1 F (36.7 C) 97.7 F (36.5 C)  98.7 F (37.1 C)  TempSrc: Oral Oral  Oral  SpO2: 97% 96% 99% 96%  Weight:  87.3 kg    Height:        Intake/Output Summary (Last 24 hours) at 04/05/2022 1203 Last data filed at 04/05/2022 7628 Gross per 24 hour  Intake 627.06  ml  Output 1700 ml  Net -1072.94 ml   Filed Weights   04/03/22 1226 04/04/22 0655 04/05/22 0609  Weight: 88.5 kg 86.8 kg 87.3 kg    Examination:   General: Appearance:     Overweight male who appears uncomfortable     Lungs:     Increased work of breathing  Heart:    Tachycardic.   MS:   All extremities are intact.   Neurologic:   Awake, alert, poor insight into disease process       Data Reviewed: I have personally reviewed following labs and imaging studies  CBC: Recent Labs  Lab 04/03/22 1221 04/04/22 0028 04/05/22 0141  WBC 10.6* 10.8* 9.7  HGB 11.5* 11.7* 11.2*  HCT 35.3* 36.9* 32.9*  MCV 87.8 87.9 85.7  PLT 194 184 315   Basic Metabolic Panel: Recent Labs  Lab 04/03/22 1221 04/04/22 0028 04/05/22 0141  NA 131* 134* 132*  K 5.4* 4.8 4.3  CL 97* 99 97*  CO2 '27 22 23  '$ GLUCOSE 304* 163* 165*  BUN 45* 47* 58*  CREATININE 2.85* 2.91* 3.29*  CALCIUM 9.3 9.4 9.0  MG  --   --  2.0   GFR: Estimated Creatinine Clearance: 17 mL/min (A) (by C-G formula based on SCr of 3.29 mg/dL (H)). Liver Function Tests: No results for input(s): "AST", "ALT", "ALKPHOS", "BILITOT", "PROT", "ALBUMIN" in the last 168 hours. No results for input(s): "LIPASE", "AMYLASE" in the  last 168 hours. No results for input(s): "AMMONIA" in the last 168 hours. Coagulation Profile: No results for input(s): "INR", "PROTIME" in the last 168 hours. Cardiac Enzymes: No results for input(s): "CKTOTAL", "CKMB", "CKMBINDEX", "TROPONINI" in the last 168 hours. BNP (last 3 results) No results for input(s): "PROBNP" in the last 8760 hours. HbA1C: No results for input(s): "HGBA1C" in the last 72 hours. CBG: Recent Labs  Lab 04/04/22 1119 04/04/22 1408 04/04/22 1655 04/04/22 2111 04/05/22 0849  GLUCAP 267* 208* 152* 166* 174*   Lipid Profile: No results for input(s): "CHOL", "HDL", "LDLCALC", "TRIG", "CHOLHDL", "LDLDIRECT" in the last 72 hours. Thyroid Function Tests: No results for  input(s): "TSH", "T4TOTAL", "FREET4", "T3FREE", "THYROIDAB" in the last 72 hours. Anemia Panel: No results for input(s): "VITAMINB12", "FOLATE", "FERRITIN", "TIBC", "IRON", "RETICCTPCT" in the last 72 hours. Sepsis Labs: Recent Labs  Lab 04/03/22 1441 04/03/22 1633  PROCALCITON  --  0.19  LATICACIDVEN 1.7 1.2    Recent Results (from the past 240 hour(s))  Culture, blood (routine x 2)     Status: None (Preliminary result)   Collection Time: 04/03/22  2:41 PM   Specimen: BLOOD RIGHT HAND  Result Value Ref Range Status   Specimen Description BLOOD RIGHT HAND  Final   Special Requests   Final    BOTTLES DRAWN AEROBIC AND ANAEROBIC Blood Culture results may not be optimal due to an inadequate volume of blood received in culture bottles   Culture   Final    NO GROWTH 2 DAYS Performed at [facility], [place] [address], [place], [place] [address]    Report Status PENDING  Incomplete  Resp panel by RT-PCR (RSV, Flu A&B, Covid) Anterior Nasal Swab     Status: None   Collection Time: 04/03/22  3:00 PM   Specimen: Anterior Nasal Swab  Result Value Ref Range Status   SARS Coronavirus 2 by RT PCR NEGATIVE NEGATIVE Final    Comment: (NOTE) SARS-CoV-2 target nucleic acids are NOT DETECTED.  The SARS-CoV-2 RNA is generally detectable in upper respiratory specimens during the acute phase of infection. The lowest concentration of SARS-CoV-2 viral copies this assay can detect is 138 copies/mL. A negative result does not preclude SARS-Cov-2 infection and should not be used as the sole basis for treatment or other patient management decisions. A negative result may occur with  improper specimen collection/handling, submission of specimen other than nasopharyngeal swab, presence of viral mutation(s) within the areas targeted by this assay, and inadequate number of viral copies(<138 copies/mL). A negative result must be combined with clinical observations, patient history, and  epidemiological information. The expected result is Negative.  Fact Sheet for Patients:  EntrepreneurPulse.com.au  Fact Sheet for Healthcare Providers:  IncredibleEmployment.be  This test is no t yet approved or cleared by the [place] FDA and  has been authorized for detection and/or diagnosis of SARS-CoV-2 by FDA under an Emergency Use Authorization (EUA). This EUA will remain  in effect (meaning this test can be used) for the duration of the COVID-19 declaration under Section 564(b)(1) of the Act, 21 U.S.C.section 360bbb-3(b)(1), unless the authorization is terminated  or revoked sooner.       Influenza A by PCR NEGATIVE NEGATIVE Final   Influenza B by PCR NEGATIVE NEGATIVE Final    Comment: (NOTE) The Xpert Xpress SARS-CoV-2/FLU/RSV plus assay is intended as an aid in the diagnosis of influenza from Nasopharyngeal swab specimens and should not be used as a sole basis for treatment. Nasal washings  and aspirates are unacceptable for Xpert Xpress SARS-CoV-2/FLU/RSV testing.  Fact Sheet for Patients: EntrepreneurPulse.com.au  Fact Sheet for Healthcare Providers: IncredibleEmployment.be  This test is not yet approved or cleared by the [place] FDA and has been authorized for detection and/or diagnosis of SARS-CoV-2 by FDA under an Emergency Use Authorization (EUA). This EUA will remain in effect (meaning this test can be used) for the duration of the COVID-19 declaration under Section 564(b)(1) of the Act, 21 U.S.C. section 360bbb-3(b)(1), unless the authorization is terminated or revoked.     Resp Syncytial Virus by PCR NEGATIVE NEGATIVE Final    Comment: (NOTE) Fact Sheet for Patients: EntrepreneurPulse.com.au  Fact Sheet for Healthcare Providers: IncredibleEmployment.be  This test is not yet approved or cleared by the [place] FDA and has been  authorized for detection and/or diagnosis of SARS-CoV-2 by FDA under an Emergency Use Authorization (EUA). This EUA will remain in effect (meaning this test can be used) for the duration of the COVID-19 declaration under Section 564(b)(1) of the Act, 21 U.S.C. section 360bbb-3(b)(1), unless the authorization is terminated or revoked.  Performed at [facility], [place] [address] [place], [place] [address]   Culture, blood (routine x 2)     Status: None (Preliminary result)   Collection Time: 04/03/22  4:33 PM   Specimen: BLOOD  Result Value Ref Range Status   Specimen Description BLOOD RIGHT ANTECUBITAL  Final   Special Requests   Final    BOTTLES DRAWN AEROBIC ONLY Blood Culture results may not be optimal due to an inadequate volume of blood received in culture bottles   Culture   Final    NO GROWTH 2 DAYS Performed at [facility], [place] [address] [place], [place] [address]    Report Status PENDING  Incomplete         Radiology Studies: ECHOCARDIOGRAM COMPLETE  Result Date: 04/04/2022    ECHOCARDIOGRAM REPORT   Patient Name:   JAYZIAH BANKHEAD Date of Exam: 04/04/2022 Medical Rec #:  [id number]       Height:       72.0 in Accession #:    [id number]      Weight:       191.3 lb Date of Birth:  01/15/1932       BSA:          2.091 m Patient Age:    16 years        BP:           95/59 mmHg Patient Gender: M               HR:           107 bpm. Exam Location:  Inpatient Procedure: 2D Echo, Cardiac Doppler, Color Doppler and Intracardiac            Opacification Agent Indications:    CHF-Acute systolic  History:        Patient has prior history of Echocardiogram examinations, most                 recent 02/01/2022. CHF, CAD, Prior CABG, Aortic Valve Disease,                 Arrythmias:Tachycardia; Risk Factors:Sleep Apnea, Hypertension,                 Diabetes and Dyslipidemia. CKD.  Sonographer:    Clayton Lefort RDCS (AE) Referring Phys: Rosalene Billings ROBERTS IMPRESSIONS  1. No LV  thrombus  seen with Definity contrast. Left ventricular ejection fraction, by estimation, is 25 to 30%. The left ventricle has severely decreased function. The left ventricle demonstrates regional wall motion abnormalities (see scoring diagram/findings for description). The left ventricular internal cavity size was mildly dilated. There is mild concentric left ventricular hypertrophy. Left ventricular diastolic parameters are consistent with Grade II diastolic dysfunction (pseudonormalization). Elevated left atrial pressure. There is severe hypokinesis of the left ventricular, entire inferolateral wall. There is severe hypokinesis of the left ventricular, entire apical segment. There is mild hypokinesis of the left ventricular, basal-mid anteroseptal wall, anterior wall and inferior wall.  2. Right ventricular systolic function is normal. The right ventricular size is normal. Tricuspid regurgitation signal is inadequate for assessing PA pressure.  3. Left atrial size was mildly dilated.  4. The mitral valve is normal in structure. Mild to moderate mitral valve regurgitation. Moderate mitral annular calcification.  5. Low flow low gradient aortic stenosis. The aortic valve was not well visualized. There is moderate calcification of the aortic valve. There is moderate thickening of the aortic valve. Aortic valve regurgitation is not visualized. Moderate to severe aortic valve stenosis.  6. The inferior vena cava is normal in size with greater than 50% respiratory variability, suggesting right atrial pressure of 3 mmHg. Comparison(s): The left ventricular function is significantly worse. The left ventricular wall motion abnormalities are new. FINDINGS  Left Ventricle: No LV thrombus seen with Definity contrast. Left ventricular ejection fraction, by estimation, is 25 to 30%. The left ventricle has severely decreased function. The left ventricle demonstrates regional wall motion abnormalities. Severe hypokinesis of the  left ventricular, entire inferolateral wall. Severe hypokinesis of the left ventricular, entire apical segment. Mild hypokinesis of the left ventricular, basal-mid anteroseptal wall, anterior wall and inferior wall. Definity contrast agent was given IV to delineate the left ventricular endocardial borders. The left ventricular internal cavity size was mildly dilated. There is mild concentric left ventricular hypertrophy. Left ventricular diastolic parameters are consistent with Grade  II diastolic dysfunction (pseudonormalization). Elevated left atrial pressure. Right Ventricle: The right ventricular size is normal. No increase in right ventricular wall thickness. Right ventricular systolic function is normal. Tricuspid regurgitation signal is inadequate for assessing PA pressure. Left Atrium: Left atrial size was mildly dilated. Right Atrium: Right atrial size was normal in size. Pericardium: There is no evidence of pericardial effusion. Mitral Valve: The mitral valve is normal in structure. There is mild thickening of the mitral valve leaflet(s). Moderate mitral annular calcification. Mild to moderate mitral valve regurgitation, with centrally-directed jet. Tricuspid Valve: The tricuspid valve is normal in structure. Tricuspid valve regurgitation is not demonstrated. Aortic Valve: Low flow low gradient aortic stenosis. The aortic valve was not well visualized. There is moderate calcification of the aortic valve. There is moderate thickening of the aortic valve. Aortic valve regurgitation is not visualized. Moderate to severe aortic stenosis is present. Aortic valve mean gradient measures 12.0 mmHg. Aortic valve peak gradient measures 18.0 mmHg. Aortic valve area, by VTI measures 1.03 cm. Pulmonic Valve: The pulmonic valve was normal in structure. Pulmonic valve regurgitation is not visualized. Aorta: The aortic root and ascending aorta are structurally normal, with no evidence of dilitation. Venous: The inferior  vena cava is normal in size with greater than 50% respiratory variability, suggesting right atrial pressure of 3 mmHg. IAS/Shunts: No atrial level shunt detected by color flow Doppler.  LEFT VENTRICLE PLAX 2D LVIDd:         5.30 cm LVIDs:  4.40 cm LV PW:         1.20 cm LV IVS:        1.20 cm LVOT diam:     2.20 cm LV SV:         38 LV SV Index:   18 LVOT Area:     3.80 cm  LV Volumes (MOD) LV vol d, MOD A4C: 183.0 ml LV vol s, MOD A4C: 125.0 ml LV SV MOD A4C:     183.0 ml RIGHT VENTRICLE             IVC RV Basal diam:  3.00 cm     IVC diam: 1.90 cm RV S prime:     11.50 cm/s TAPSE (M-mode): 2.2 cm LEFT ATRIUM             Index        RIGHT ATRIUM           Index LA diam:        4.20 cm 2.01 cm/m   RA Area:     15.60 cm LA Vol (A2C):   52.0 ml 24.87 ml/m  RA Volume:   37.20 ml  17.79 ml/m LA Vol (A4C):   56.1 ml 26.83 ml/m LA Biplane Vol: 54.1 ml 25.88 ml/m  AORTIC VALVE AV Area (Vmax):    1.13 cm AV Area (Vmean):   0.98 cm AV Area (VTI):     1.03 cm AV Vmax:           212.00 cm/s AV Vmean:          164.000 cm/s AV VTI:            0.367 m AV Peak Grad:      18.0 mmHg AV Mean Grad:      12.0 mmHg LVOT Vmax:         63.20 cm/s LVOT Vmean:        42.300 cm/s LVOT VTI:          0.099 m LVOT/AV VTI ratio: 0.27  AORTA Ao Root diam: 3.20 cm Ao Asc diam:  3.30 cm  SHUNTS Systemic VTI:  0.10 m Systemic Diam: 2.20 cm Dani Gobble Croitoru MD Electronically signed by Sanda Klein MD Signature Date/Time: 04/04/2022/12:58:25 PM    Final    DG Chest Port 1 View  Result Date: 04/03/2022 CLINICAL DATA:  Chest pain EXAM: PORTABLE CHEST 1 VIEW COMPARISON:  Previous chest radiograph done on 01/27/2021, CT chest done on 03/04/2021 FINDINGS: Transverse diameter of heart is slightly increased. There is previous coronary bypass surgery. Central pulmonary vessels are prominent. Increased interstitial markings are seen in parahilar regions and lower lung fields. There is minimal blunting of right lateral CP angle. There is  no pneumothorax. IMPRESSION: Increased interstitial markings are seen in right parahilar region and both lower lung fields. Findings suggest possible multifocal pneumonia. Part of this finding may be due to asymmetric interstitial edema. Minimal right pleural effusion. Electronically Signed   By: Elmer Picker M.D.   On: 04/03/2022 13:10        Scheduled Meds:  aspirin EC  81 mg Oral QHS   clopidogrel  75 mg Oral Daily   finasteride  5 mg Oral Daily   guaiFENesin  600 mg Oral BID   insulin aspart  0-15 Units Subcutaneous TID WC   insulin aspart  0-5 Units Subcutaneous QHS   isosorbide mononitrate  30 mg Oral Daily   metoprolol succinate  12.5 mg Oral Daily  nortriptyline  10-20 mg Oral QHS   pantoprazole  40 mg Oral Daily   pregabalin  50 mg Oral QHS   rosuvastatin  10 mg Oral Daily   sodium chloride flush  3 mL Intravenous Q12H   tamsulosin  0.4 mg Oral QHS   umeclidinium bromide  1 puff Inhalation Daily   Continuous Infusions:  azithromycin Stopped (04/04/22 1643)   cefTRIAXone (ROCEPHIN)  IV Stopped (04/04/22 1507)     LOS: 2 days    Time spent: 35 minutes    Geradine Girt, DO [facility]  If 7PM-7AM, please contact night-coverage [email or web address] 04/05/2022, 12:03 PM

## 2022-04-05 NOTE — Progress Notes (Signed)
   04/05/22 1815  Assess: MEWS Score  BP 120/80  MAP (mmHg) 92  Pulse Rate (!) 120  ECG Heart Rate (!) 119  SpO2 93 %  Assess: MEWS Score  MEWS Temp 0  MEWS Systolic 0  MEWS Pulse 2  MEWS RR 0  MEWS LOC 0  MEWS Score 2  MEWS Score Color Yellow  Assess: if the MEWS score is Yellow or Red  Were vital signs taken at a resting state? Yes  Focused Assessment Change from prior assessment (see assessment flowsheet)  Does the patient meet 2 or more of the SIRS criteria? No  Does the patient have a confirmed or suspected source of infection? No  MEWS guidelines implemented *See Row Information* Yes  Treat  Pain Scale 0-10  Pain Score 8  Pain Location Chest  Pain Orientation Anterior;Lower  Pain Intervention(s) Medication (See eMAR)  Take Vital Signs  Increase Vital Sign Frequency  Yellow: Q 2hr X 2 then Q 4hr X 2, if remains yellow, continue Q 4hrs  Escalate  MEWS: Escalate Yellow: discuss with charge nurse/RN and consider discussing with provider and RRT  Notify: Charge Nurse/RN  Name of Charge Nurse/RN Notified Kerrie Buffalo  Date Charge Nurse/RN Notified 04/05/22  Time Charge Nurse/RN Notified 1826  Document  Patient Outcome Stabilized after interventions  Progress note created (see row info) Yes  Assess: SIRS CRITERIA  SIRS Temperature  0  SIRS Pulse 1  SIRS Respirations  0  SIRS WBC 0  SIRS Score Sum  1

## 2022-04-05 NOTE — Evaluation (Signed)
Physical Therapy Evaluation Patient Details Name: [name] MRN: [id number] DOB: [birth year] Today's Date: 04/05/2022  History of Present Illness  Patient is a 89 y/o male who presents on 12/30 with chest pain. Found to have NSTEMI. CXR- multifocal PNA vs asymmetric edema with pleural effusion on right. PMH includes bladder ca, HTN, OSA, CAD, CABG, chronic pain, CKD, DM, RA, CHF, TKR.  Clinical Impression  Patient presents with generalized weakness/deconditioning, dyspnea on exertion, decreased activity tolerance and impaired mobility s/p above. Pt lives at home with his wife and daughter (reports daughter cannot assist him at all but wife can?) Today, pt requires Min A for bed mobility, Mod A for standing and min guard assist for ambulation with use of RW for support. Sp02 dropped to mid 80s on RA, donned 3L/min 02 [place] for activity and able to maintain 02 >90%. Pt reports he uses 02 at home at night so has it if he needs it. Encouraged pt to wear it for walking/activity at home during the day for now. Will need physical assist at home for standing from low surfaces. Will follow acutely to maximize independence and mobility prior to return home.       Recommendations for follow up therapy are one component of a multi-disciplinary discharge planning process, led by the attending physician.  Recommendations may be updated based on patient status, additional functional criteria and insurance authorization.  Follow Up Recommendations Home health PT      Assistance Recommended at Discharge Intermittent Supervision/Assistance  Patient can return home with the following  A little help with walking and/or transfers;A little help with bathing/dressing/bathroom;Assist for transportation;Assistance with cooking/housework    Equipment Recommendations None recommended by PT  Recommendations for Other Services       Functional Status Assessment Patient has had a recent decline in their functional  status and demonstrates the ability to make significant improvements in function in a reasonable and predictable amount of time.     Precautions / Restrictions Precautions Precautions: Fall;Other (comment) Precaution Comments: watch 02 Restrictions Weight Bearing Restrictions: No      Mobility  Bed Mobility Overal bed mobility: Needs Assistance Bed Mobility: Supine to Sit, Sit to Supine     Supine to sit: Min assist, HOB elevated Sit to supine: Supervision, HOB elevated   General bed mobility comments: Pt reaching out for therapist's hand to get assist with trunk elevation, increased time. Able to bring LEs back into bed.    Transfers Overall transfer level: Needs assistance Equipment used: Rolling walker (2 wheels) Transfers: Sit to/from Stand Sit to Stand: Mod assist           General transfer comment: Mod A to power to standing with cues for hand placement/technique and pt wanting to hold onto therapist's arm despite cues. Stood from [employer].    Ambulation/Gait Ambulation/Gait assistance: Min guard Gait Distance (Feet): 100 Feet Assistive device: Rolling walker (2 wheels) Gait Pattern/deviations: Step-through pattern, Decreased stride length   Gait velocity interpretation: 1.31 - 2.62 ft/sec, indicative of limited community ambulator   General Gait Details: Slow, mostly steady gait with use of RW for support, close Min guard for safety. 2-3/4 DOE. Sp02 dropped to mid 80s on RA, donned 3L/min 02 [place] for activity and able to maintain 02 >90%.  Stairs            Wheelchair Mobility    Modified Rankin (Stroke Patients Only)       Balance Overall balance assessment: Needs assistance Sitting-balance support:  Feet supported, No upper extremity supported Sitting balance-Leahy Scale: Good     Standing balance support: During functional activity, Bilateral upper extremity supported Standing balance-Leahy Scale: Poor Standing balance comment: Requires UE  support for dynamic tasks/walking, able to stand statically without UE support.                             Pertinent Vitals/Pain Pain Assessment Pain Assessment: No/denies pain    Home Living Family/patient expects to be discharged to:: Private residence Living Arrangements: Spouse/significant other;Children Available Help at Discharge: Family;Available 24 hours/day Type of Home: House Home Access: Ramped entrance       Home Layout: One level Home Equipment: Rollator (4 wheels);Cane - single point;BSC/3in1;Grab bars - tub/shower      Prior Function Prior Level of Function : Independent/Modified Independent             Mobility Comments: Uses rollator for household ambulation and SPC for community ambulation ADLs Comments: independent, does not drive. Wears 02 at night.     Hand Dominance   Dominant Hand: Right    Extremity/Trunk Assessment   Upper Extremity Assessment Upper Extremity Assessment: Defer to OT evaluation    Lower Extremity Assessment Lower Extremity Assessment: Generalized weakness (but functional)       Communication   Communication: HOH  Cognition Arousal/Alertness: Awake/alert Behavior During Therapy: WFL for tasks assessed/performed Overall Cognitive Status: Within Functional Limits for tasks assessed                                          General Comments      Exercises     Assessment/Plan    PT Assessment Patient needs continued PT services  PT Problem List Decreased strength;Decreased mobility;Cardiopulmonary status limiting activity;Decreased activity tolerance       PT Treatment Interventions Therapeutic activities;Gait training;Therapeutic exercise;Patient/family education;Balance training;Functional mobility training    PT Goals (Current goals can be found in the Care Plan section)  Acute Rehab PT Goals Patient Stated Goal: to go home today PT Goal Formulation: With patient Time For Goal  Achievement: 04/19/22 Potential to Achieve Goals: Good    Frequency Min 3X/week     Co-evaluation               AM-PAC PT "6 Clicks" Mobility  Outcome Measure Help needed turning from your back to your side while in a flat bed without using bedrails?: A Little Help needed moving from lying on your back to sitting on the side of a flat bed without using bedrails?: A Little Help needed moving to and from a bed to a chair (including a wheelchair)?: A Lot Help needed standing up from a chair using your arms (e.g., wheelchair or bedside chair)?: A Lot Help needed to walk in hospital room?: A Little Help needed climbing 3-5 steps with a railing? : A Lot 6 Click Score: 15    End of Session Equipment Utilized During Treatment: Oxygen;Gait belt Activity Tolerance: Patient tolerated treatment well;Treatment limited secondary to medical complications (Comment) (drop in Sp02 on RA) Patient left: in bed;with call bell/phone within reach;with bed alarm set Nurse Communication: Mobility status PT Visit Diagnosis: Muscle weakness (generalized) (M62.81);Difficulty in walking, not elsewhere classified (R26.2);Unsteadiness on feet (R26.81)    Time: 8937-3428 PT Time Calculation (min) (ACUTE ONLY): 23 min   Charges:   PT  Evaluation $PT Eval Moderate Complexity: 1 Mod PT Treatments $Gait Training: 8-22 mins        Marisa Severin, PT, DPT Acute Rehabilitation Services Secure chat preferred Office [place] 04/05/2022, 1:21 PM

## 2022-04-05 NOTE — Progress Notes (Signed)
Rounding Note    Patient Name: [name] Date of Encounter: 04/05/2022  [place] Cardiologist: [name], MD   Subjective   Feels better. No CP. EF reduced now on ECHO.  Would like to try to go home.  Inpatient Medications    Scheduled Meds:  aspirin EC  81 mg Oral QHS   clopidogrel  75 mg Oral Daily   finasteride  5 mg Oral Daily   guaiFENesin  600 mg Oral BID   insulin aspart  0-15 Units Subcutaneous TID WC   insulin aspart  0-5 Units Subcutaneous QHS   isosorbide mononitrate  30 mg Oral Daily   nortriptyline  10-20 mg Oral QHS   pantoprazole  40 mg Oral Daily   pregabalin  50 mg Oral QHS   rosuvastatin  10 mg Oral Daily   sodium chloride flush  3 mL Intravenous Q12H   tamsulosin  0.4 mg Oral QHS   umeclidinium bromide  1 puff Inhalation Daily   Continuous Infusions:  azithromycin Stopped (04/04/22 1643)   cefTRIAXone (ROCEPHIN)  IV Stopped (04/04/22 1507)   heparin 1,350 Units/hr (04/05/22 0209)   PRN Meds: acetaminophen **OR** acetaminophen, albuterol, hydrALAZINE, HYDROcodone-acetaminophen, nitroGLYCERIN   Vital Signs    Vitals:   04/04/22 1702 04/04/22 1937 04/05/22 0609 04/05/22 0824  BP: 95/68 111/73 125/77   Pulse:  97 100   Resp:  18 20   Temp:  98.1 F (36.7 C) 97.7 F (36.5 C)   TempSrc:  Oral Oral   SpO2:  97% 96% 99%  Weight:   87.3 kg   Height:        Intake/Output Summary (Last 24 hours) at 04/05/2022 0843 Last data filed at 04/05/2022 0610 Gross per 24 hour  Intake 627.06 ml  Output 1700 ml  Net -1072.94 ml      04/05/2022    6:09 AM 04/04/2022    6:55 AM 04/03/2022   12:26 PM  Last 3 Weights  Weight (lbs) 192 lb 7.4 oz 191 lb 4.8 oz 195 lb  Weight (kg) 87.3 kg 86.773 kg 88.451 kg      Telemetry    Sinus tachy 101-110, occasional PVCs no sustained ventricular tachycardia- Personally Reviewed  ECG    As below - Personally Reviewed  Physical Exam   GEN: No acute distress.  Pleasant Neck: No  JVD Cardiac: RRR, 2/6 systolic murmur, rubs, or gallops.  Respiratory: Clear to auscultation bilaterally. GI: Soft, nontender, non-distended  MS: No edema; No deformity. Neuro:  Nonfocal  Psych: Normal affect   Labs    High Sensitivity Troponin:   Recent Labs  Lab 04/03/22 1221 04/03/22 1441 04/04/22 0821 04/04/22 1014  TROPONINIHS 123* 109* 111* 100*     Chemistry Recent Labs  Lab 04/03/22 1221 04/04/22 0028 04/05/22 0141  NA 131* 134* 132*  K 5.4* 4.8 4.3  CL 97* 99 97*  CO2 '27 22 23  '$ GLUCOSE 304* 163* 165*  BUN 45* 47* 58*  CREATININE 2.85* 2.91* 3.29*  CALCIUM 9.3 9.4 9.0  MG  --   --  2.0  GFRNONAA 21* 20* 17*  ANIONGAP '7 13 12    '$ Lipids No results for input(s): "CHOL", "TRIG", "HDL", "LABVLDL", "LDLCALC", "CHOLHDL" in the last 168 hours.  Hematology Recent Labs  Lab 04/03/22 1221 04/04/22 0028 04/05/22 0141  WBC 10.6* 10.8* 9.7  RBC 4.02* 4.20* 3.84*  HGB 11.5* 11.7* 11.2*  HCT 35.3* 36.9* 32.9*  MCV 87.8 87.9 85.7  MCH 28.6  27.9 29.2  MCHC 32.6 31.7 34.0  RDW 14.3 14.4 14.1  PLT 194 184 190   Thyroid No results for input(s): "TSH", "FREET4" in the last 168 hours.  BNP Recent Labs  Lab 04/03/22 1221  BNP 447.8*    DDimer No results for input(s): "DDIMER" in the last 168 hours.   Radiology    ECHOCARDIOGRAM COMPLETE  Result Date: 04/04/2022    ECHOCARDIOGRAM REPORT   Patient Name:   [name] Date of Exam: 04/04/2022 Medical Rec #:  [id number]       Height:       72.0 in Accession #:    [id number]      Weight:       191.3 lb Date of Birth:  1931/09/17       BSA:          2.091 m Patient Age:    89 years        BP:           95/59 mmHg Patient Gender: M               HR:           107 bpm. Exam Location:  Inpatient Procedure: 2D Echo, Cardiac Doppler, Color Doppler and Intracardiac            Opacification Agent Indications:    CHF-Acute systolic  History:        Patient has prior history of Echocardiogram examinations, most                  recent 02/01/2022. CHF, CAD, Prior CABG, Aortic Valve Disease,                 Arrythmias:Tachycardia; Risk Factors:Sleep Apnea, Hypertension,                 Diabetes and Dyslipidemia. CKD.  Sonographer:    Clayton Lefort RDCS (AE) Referring Phys: Rosalene Billings ROBERTS IMPRESSIONS  1. No LV thrombus seen with Definity contrast. Left ventricular ejection fraction, by estimation, is 25 to 30%. The left ventricle has severely decreased function. The left ventricle demonstrates regional wall motion abnormalities (see scoring diagram/findings for description). The left ventricular internal cavity size was mildly dilated. There is mild concentric left ventricular hypertrophy. Left ventricular diastolic parameters are consistent with Grade II diastolic dysfunction (pseudonormalization). Elevated left atrial pressure. There is severe hypokinesis of the left ventricular, entire inferolateral wall. There is severe hypokinesis of the left ventricular, entire apical segment. There is mild hypokinesis of the left ventricular, basal-mid anteroseptal wall, anterior wall and inferior wall.  2. Right ventricular systolic function is normal. The right ventricular size is normal. Tricuspid regurgitation signal is inadequate for assessing PA pressure.  3. Left atrial size was mildly dilated.  4. The mitral valve is normal in structure. Mild to moderate mitral valve regurgitation. Moderate mitral annular calcification.  5. Low flow low gradient aortic stenosis. The aortic valve was not well visualized. There is moderate calcification of the aortic valve. There is moderate thickening of the aortic valve. Aortic valve regurgitation is not visualized. Moderate to severe aortic valve stenosis.  6. The inferior vena cava is normal in size with greater than 50% respiratory variability, suggesting right atrial pressure of 3 mmHg. Comparison(s): The left ventricular function is significantly worse. The left ventricular wall motion abnormalities are  new. FINDINGS  Left Ventricle: No LV thrombus seen with Definity contrast. Left ventricular ejection fraction, by estimation, is 25 to 30%.  The left ventricle has severely decreased function. The left ventricle demonstrates regional wall motion abnormalities. Severe hypokinesis of the left ventricular, entire inferolateral wall. Severe hypokinesis of the left ventricular, entire apical segment. Mild hypokinesis of the left ventricular, basal-mid anteroseptal wall, anterior wall and inferior wall. Definity contrast agent was given IV to delineate the left ventricular endocardial borders. The left ventricular internal cavity size was mildly dilated. There is mild concentric left ventricular hypertrophy. Left ventricular diastolic parameters are consistent with Grade  II diastolic dysfunction (pseudonormalization). Elevated left atrial pressure. Right Ventricle: The right ventricular size is normal. No increase in right ventricular wall thickness. Right ventricular systolic function is normal. Tricuspid regurgitation signal is inadequate for assessing PA pressure. Left Atrium: Left atrial size was mildly dilated. Right Atrium: Right atrial size was normal in size. Pericardium: There is no evidence of pericardial effusion. Mitral Valve: The mitral valve is normal in structure. There is mild thickening of the mitral valve leaflet(s). Moderate mitral annular calcification. Mild to moderate mitral valve regurgitation, with centrally-directed jet. Tricuspid Valve: The tricuspid valve is normal in structure. Tricuspid valve regurgitation is not demonstrated. Aortic Valve: Low flow low gradient aortic stenosis. The aortic valve was not well visualized. There is moderate calcification of the aortic valve. There is moderate thickening of the aortic valve. Aortic valve regurgitation is not visualized. Moderate to severe aortic stenosis is present. Aortic valve mean gradient measures 12.0 mmHg. Aortic valve peak gradient measures  18.0 mmHg. Aortic valve area, by VTI measures 1.03 cm. Pulmonic Valve: The pulmonic valve was normal in structure. Pulmonic valve regurgitation is not visualized. Aorta: The aortic root and ascending aorta are structurally normal, with no evidence of dilitation. Venous: The inferior vena cava is normal in size with greater than 50% respiratory variability, suggesting right atrial pressure of 3 mmHg. IAS/Shunts: No atrial level shunt detected by color flow Doppler.  LEFT VENTRICLE PLAX 2D LVIDd:         5.30 cm LVIDs:         4.40 cm LV PW:         1.20 cm LV IVS:        1.20 cm LVOT diam:     2.20 cm LV SV:         38 LV SV Index:   18 LVOT Area:     3.80 cm  LV Volumes (MOD) LV vol d, MOD A4C: 183.0 ml LV vol s, MOD A4C: 125.0 ml LV SV MOD A4C:     183.0 ml RIGHT VENTRICLE             IVC RV Basal diam:  3.00 cm     IVC diam: 1.90 cm RV S prime:     11.50 cm/s TAPSE (M-mode): 2.2 cm LEFT ATRIUM             Index        RIGHT ATRIUM           Index LA diam:        4.20 cm 2.01 cm/m   RA Area:     15.60 cm LA Vol (A2C):   52.0 ml 24.87 ml/m  RA Volume:   37.20 ml  17.79 ml/m LA Vol (A4C):   56.1 ml 26.83 ml/m LA Biplane Vol: 54.1 ml 25.88 ml/m  AORTIC VALVE AV Area (Vmax):    1.13 cm AV Area (Vmean):   0.98 cm AV Area (VTI):     1.03 cm AV Vmax:  212.00 cm/s AV Vmean:          164.000 cm/s AV VTI:            0.367 m AV Peak Grad:      18.0 mmHg AV Mean Grad:      12.0 mmHg LVOT Vmax:         63.20 cm/s LVOT Vmean:        42.300 cm/s LVOT VTI:          0.099 m LVOT/AV VTI ratio: 0.27  AORTA Ao Root diam: 3.20 cm Ao Asc diam:  3.30 cm  SHUNTS Systemic VTI:  0.10 m Systemic Diam: 2.20 cm Dani Gobble Croitoru MD Electronically signed by Sanda Klein MD Signature Date/Time: 04/04/2022/12:58:25 PM    Final    DG Chest Port 1 View  Result Date: 04/03/2022 CLINICAL DATA:  Chest pain EXAM: PORTABLE CHEST 1 VIEW COMPARISON:  Previous chest radiograph done on 01/27/2021, CT chest done on 03/04/2021  FINDINGS: Transverse diameter of heart is slightly increased. There is previous coronary bypass surgery. Central pulmonary vessels are prominent. Increased interstitial markings are seen in parahilar regions and lower lung fields. There is minimal blunting of right lateral CP angle. There is no pneumothorax. IMPRESSION: Increased interstitial markings are seen in right parahilar region and both lower lung fields. Findings suggest possible multifocal pneumonia. Part of this finding may be due to asymmetric interstitial edema. Minimal right pleural effusion. Electronically Signed   By: Elmer Picker M.D.   On: 04/03/2022 13:10     Cardiac Catheterization 08/13/15 LM ost 80 LAD prox 100 OM2 90 RCA prox 100 L-LAD ok S-OM1/OM2 prox 99 ISR S-RCA 100 (CTO) S-D1 prox 100 (CTO) PCI:  3.5 x 12 mm Resolute DES to S-OM1/OM2   Diagnostic Dominance: Right  Intervention       Patient Profile     89 y.o. male with chronic kidney disease creatinine approximately 2 at baseline with known coronary artery disease post bypass in 2001 with most recent stent placement to SVG OM.  Has occluded SVG to RCA and SVG to D1.  Here with non-ST elevation myocardial infarction.  Assessment & Plan    Non-ST elevation myocardial infarction - Given his advanced chronic kidney disease, we will treat medically.  We once again discussed this morning especially in light of his reduced ejection fraction.  We are concerned about the potential for worsening renal function in the setting of coronary intervention.  His brother had hemodialysis and decided to stop on his own volition because of poor lifestyle he states.  He does not wish to go in this direction. - It is likely that his SVG to OM graft has once again restenosed.  EF is down.  His troponin however is only mildly elevated ranging from 123 down to 111.  Prior to this admission his troponins have been mildly elevated chronically at around 40-70. -EKG shows ST  depression V3 through V6.  This finding is new when compared to prior ECG. - Continue with isosorbide 30 mg a day which was started 12/31.  He seems to recall taking this medication in the past.  No headache. --Plavix '75mg'$  for at least one year.  Ischemic cardiomyopathy - ECHO this admit with EF 25-30%, global hypokinesis. Prior  echocardiogram February 01, 2022 showed EF of 40 to 45% with basal inferior akinesis.  Received IV Lasix 12/31. Creat increased again. Hold off on further lasix. Appears euvolemic.  --Does not have any room from BP perspective for  GDMT at this time.  We will however utilize Toprol-XL 12.5 mg.  Continue with isosorbide 30 mg as antianginal as well.  No room for Entresto.  Kidney function does not allow for Jardiance or Farxiga.  No room for spironolactone either.  Aortic stenosis - Mild to moderate, 14 mmHg previously in OCT. Now with reduced EF may have more significant low flow low gradient severe AS.  Would not be a candidate for TAVR.  Chronic kidney disease stage IV - Follows with nephrology, Dr. Royce Macadamia as outpatient. Creatinine worse this AM from 2.9 to 3.3.   Chronic pain - Recent visit with Dr. Silvio Pate noted increasing problems with pain and request to restart chronic narcotic therapy.  We had discussion that he is at increased risk for morbidity mortality.  We will go ahead and try to discharge he is eager to try to go home. Spoke with [name] his daughter.  We will get close follow-up.   For questions or updates, please contact [place] Please consult [email or web address] for contact info under        Signed, Candee Furbish, MD  04/05/2022, 8:43 AM

## 2022-04-06 ENCOUNTER — Inpatient Hospital Stay (HOSPITAL_COMMUNITY): Payer: Medicare Other

## 2022-04-06 DIAGNOSIS — R7989 Other specified abnormal findings of blood chemistry: Secondary | ICD-10-CM | POA: Diagnosis not present

## 2022-04-06 LAB — BASIC METABOLIC PANEL
Anion gap: 14 (ref 5–15)
BUN: 65 mg/dL — ABNORMAL HIGH (ref 8–23)
CO2: 22 mmol/L (ref 22–32)
Calcium: 9.2 mg/dL (ref 8.9–10.3)
Chloride: 96 mmol/L — ABNORMAL LOW (ref 98–111)
Creatinine, Ser: 3 mg/dL — ABNORMAL HIGH (ref 0.61–1.24)
GFR, Estimated: 19 mL/min — ABNORMAL LOW (ref >60)
Glucose, Bld: 164 mg/dL — ABNORMAL HIGH (ref 70–99)
Potassium: 4.3 mmol/L (ref 3.5–5.1)
Sodium: 132 mmol/L — ABNORMAL LOW (ref 135–145)

## 2022-04-06 LAB — CBC
HCT: 34.5 % — ABNORMAL LOW (ref 39.0–52.0)
Hemoglobin: 11.5 g/dL — ABNORMAL LOW (ref 13.0–17.0)
MCH: 28.2 pg (ref 26.0–34.0)
MCHC: 33.3 g/dL (ref 30.0–36.0)
MCV: 84.6 fL (ref 80.0–100.0)
Platelets: 207 10*3/uL (ref 150–400)
RBC: 4.08 MIL/uL — ABNORMAL LOW (ref 4.22–5.81)
RDW: 14 % (ref 11.5–15.5)
WBC: 9.1 10*3/uL (ref 4.0–10.5)
nRBC: 0 % (ref 0.0–0.2)

## 2022-04-06 LAB — GLUCOSE, CAPILLARY
Glucose-Capillary: 186 mg/dL — ABNORMAL HIGH (ref 70–99)
Glucose-Capillary: 208 mg/dL — ABNORMAL HIGH (ref 70–99)
Glucose-Capillary: 189 mg/dL — ABNORMAL HIGH (ref 70–99)
Glucose-Capillary: 212 mg/dL — ABNORMAL HIGH (ref 70–99)
Glucose-Capillary: 227 mg/dL — ABNORMAL HIGH (ref 70–99)
Glucose-Capillary: 159 mg/dL — ABNORMAL HIGH (ref 70–99)

## 2022-04-06 MED ORDER — ISOSORBIDE MONONITRATE ER 30 MG PO TB24
30.0000 mg | ORAL_TABLET | Freq: Once | ORAL | Status: AC
  Administered 2022-04-06: 30 mg via ORAL
  Filled 2022-04-06: qty 1

## 2022-04-06 MED ORDER — FUROSEMIDE 10 MG/ML IJ SOLN
40.0000 mg | Freq: Once | INTRAMUSCULAR | Status: AC
  Administered 2022-04-06: 40 mg via INTRAVENOUS
  Filled 2022-04-06: qty 4

## 2022-04-06 MED ORDER — ISOSORBIDE MONONITRATE ER 60 MG PO TB24
60.0000 mg | ORAL_TABLET | Freq: Every day | ORAL | Status: DC
  Administered 2022-04-07 – 2022-04-12 (×6): 60 mg via ORAL
  Filled 2022-04-06 (×6): qty 1

## 2022-04-06 MED ORDER — SALINE SPRAY 0.65 % NA SOLN: 1.0000 | NASAL | Status: DC | PRN

## 2022-04-06 MED ORDER — METOPROLOL SUCCINATE ER 25 MG PO TB24
25.0000 mg | ORAL_TABLET | Freq: Every day | ORAL | Status: DC
  Administered 2022-04-06 – 2022-04-07 (×2): 25 mg via ORAL
  Filled 2022-04-06 (×2): qty 1

## 2022-04-06 NOTE — Progress Notes (Signed)
Heart Failure Navigator Progress Note  Assessed for Heart & Vascular TOC clinic readiness.  Patient does not meet criteria due to has a [facility] close follow up on 04/16/22.   Navigator will sign off at this time.   Earnestine Leys, BSN, Clinical cytogeneticist Only

## 2022-04-06 NOTE — Telephone Encounter (Signed)
Currently admitted to hospital  

## 2022-04-06 NOTE — Evaluation (Signed)
Occupational Therapy Evaluation Patient Details Name: [name] MRN: [id number] DOB: Mar 24, 1932 Today's Date: 04/06/2022   History of Present Illness Patient is a 89 y/o male who presents on 12/30 with chest pain. Found to have NSTEMI. CXR- multifocal PNA vs asymmetric edema with pleural effusion on right. PMH includes bladder ca, HTN, OSA, CAD, CABG, chronic pain, CKD, DM, RA, CHF, TKR.   Clinical Impression   [name] was evaluated s/p the above admission list, he is generally mod I at baseline with AD and O2 use at night. Upon evaluation pt was limited by elevated RR, anxiety due to SOB, generalized weakness, poor activity tolerance, decreased cardiopulmonary endurance and increased O2 needs. Initially pt was declining participation due to SOB, however he was on wet/soiled sheets and agreeable to stand to replace with dry linen. Overall he required min A for bed mobility and heavy mod A to stand EOB for ~1 minute with RW prior to requesting to return supine. SpO2 >90% on 4L [place] throughout. Due to deficits listed below, he requires up to max A for ADLs. OT to continue to follow acutely. Recommend d/c to SNF for continued therapy prior to d/c home with his wife.      Recommendations for follow up therapy are one component of a multi-disciplinary discharge planning process, led by the attending physician.  Recommendations may be updated based on patient status, additional functional criteria and insurance authorization.   Follow Up Recommendations  Skilled nursing-short term rehab (<3 hours/day) (vs. home health pending pt progress)     Assistance Recommended at Discharge Frequent or constant Supervision/Assistance  Patient can return home with the following A lot of help with walking and/or transfers;A lot of help with bathing/dressing/bathroom;Assistance with cooking/housework;Direct supervision/assist for financial management;Direct supervision/assist for medications management;Assist for  transportation;Help with stairs or ramp for entrance    Functional Status Assessment  Patient has had a recent decline in their functional status and demonstrates the ability to make significant improvements in function in a reasonable and predictable amount of time.  Equipment Recommendations  Other (comment) (pending progress, pt is well equipped)    Recommendations for Other Services       Precautions / Restrictions Precautions Precautions: Fall;Other (comment) Precaution Comments: watch 02 Restrictions Weight Bearing Restrictions: No      Mobility Bed Mobility Overal bed mobility: Needs Assistance Bed Mobility: Supine to Sit, Sit to Supine     Supine to sit: Min assist Sit to supine: Min assist        Transfers Overall transfer level: Needs assistance Equipment used: Rolling walker (2 wheels) Transfers: Sit to/from Stand Sit to Stand: Mod assist                  Balance Overall balance assessment: Needs assistance Sitting-balance support: Feet supported, No upper extremity supported Sitting balance-Leahy Scale: Good     Standing balance support: During functional activity, Bilateral upper extremity supported Standing balance-Leahy Scale: Poor                             ADL either performed or assessed with clinical judgement   ADL Overall ADL's : Needs assistance/impaired Eating/Feeding: Set up;Sitting   Grooming: Set up;Sitting   Upper Body Bathing: Moderate assistance;Sitting   Lower Body Bathing: Maximal assistance;Sit to/from stand   Upper Body Dressing : Minimal assistance;Sitting   Lower Body Dressing: Maximal assistance;Sit to/from stand   Toilet Transfer: Moderate assistance;Stand-pivot;BSC/3in1;Rolling walker (2 wheels)  Toileting- Clothing Manipulation and Hygiene: Maximal assistance;Sit to/from stand       Functional mobility during ADLs: Moderate assistance;Rolling walker (2 wheels) General ADL Comments: limited  by anxiety, SOB, weakness, poor balance, poor activity tolerance     Vision Baseline Vision/History: 0 No visual deficits Vision Assessment?: No apparent visual deficits     Perception Perception Perception Tested?: No   Praxis Praxis Praxis tested?: Not tested    Pertinent Vitals/Pain Pain Assessment Pain Assessment: No/denies pain     Hand Dominance Right   Extremity/Trunk Assessment Upper Extremity Assessment Upper Extremity Assessment: Generalized weakness   Lower Extremity Assessment Lower Extremity Assessment: Defer to PT evaluation   Cervical / Trunk Assessment Cervical / Trunk Assessment: Kyphotic   Communication Communication Communication: HOH   Cognition Arousal/Alertness: Awake/alert Behavior During Therapy: Anxious Overall Cognitive Status: Difficult to assess                                 General Comments: no family present, pt very anxious about SOB. required max cues to PLB, unable to maintain technique.     General Comments  VSS on 4L [place], cues for PLB, pt continuously asking for RN    Exercises     Shoulder Instructions      Home Living Family/patient expects to be discharged to:: Private residence Living Arrangements: Spouse/significant other;Children Available Help at Discharge: Family;Available 24 hours/day Type of Home: House Home Access: Ramped entrance     Home Layout: One level     Bathroom Shower/Tub: Occupational psychologist: Standard     Home Equipment: Rollator (4 wheels);Cane - single point;BSC/3in1;Grab bars - tub/shower          Prior Functioning/Environment Prior Level of Function : Independent/Modified Independent             Mobility Comments: Uses rollator for household ambulation and SPC for community ambulation ADLs Comments: independent, does not drive. Wears 02 at night.        OT Problem List: Decreased strength;Decreased activity tolerance;Decreased range of  motion;Impaired balance (sitting and/or standing);Decreased cognition;Decreased safety awareness;Decreased knowledge of use of DME or AE;Decreased knowledge of precautions;Cardiopulmonary status limiting activity      OT Treatment/Interventions: Self-care/ADL training;Therapeutic exercise;DME and/or AE instruction;Therapeutic activities;Patient/family education;Balance training    OT Goals(Current goals can be found in the care plan section) Acute Rehab OT Goals Patient Stated Goal: to breathe better OT Goal Formulation: With patient Time For Goal Achievement: 04/20/22 Potential to Achieve Goals: Fair ADL Goals Pt Will Perform Grooming: with min guard assist;standing Pt Will Perform Lower Body Dressing: with mod assist;sit to/from stand Pt Will Transfer to Toilet: with min assist;ambulating Pt Will Perform Toileting - Clothing Manipulation and hygiene: with min assist;sitting/lateral leans Additional ADL Goal #1: Pt will tolerate at least 5 minutes of OOB functional activity to demonstrated increased tolerance  OT Frequency: Min 2X/week    Co-evaluation              AM-PAC OT "6 Clicks" Daily Activity     Outcome Measure Help from another person eating meals?: A Little Help from another person taking care of personal grooming?: A Little Help from another person toileting, which includes using toliet, bedpan, or urinal?: A Lot Help from another person bathing (including washing, rinsing, drying)?: A Lot Help from another person to put on and taking off regular upper body clothing?: A Little Help from another person  to put on and taking off regular lower body clothing?: A Lot 6 Click Score: 15   End of Session Equipment Utilized During Treatment: Rolling walker (2 wheels);Oxygen Nurse Communication: Mobility status (SOB, male purwick off)  Activity Tolerance: Patient tolerated treatment well Patient left: in bed;with call bell/phone within reach;with bed alarm set  OT Visit  Diagnosis: Unsteadiness on feet (R26.81);Muscle weakness (generalized) (M62.81)                Time: 4665-9935 OT Time Calculation (min): 19 min Charges:  OT General Charges $OT Visit: 1 Visit OT Evaluation $OT Eval Moderate Complexity: 1 Mod    Denai Caba D Causey 04/06/2022, 11:57 AM

## 2022-04-06 NOTE — Progress Notes (Signed)
PROGRESS NOTE    [name]  IEP:[id number] DOB: 10-09-1931 DOA: 04/03/2022 PCP: [name], MD   Brief Narrative:    [name] is a 89 y.o. male with medical history significant of diastolic CHF, CAD, interstitial lung disease, nocturnal hypoxemia on 2 L of nasal cannula oxygen, bladder cancer, rheumatoid arthritis, and CKD stage IV presents with complaints of chest pain over the last 2 days.  He has been admitted for chest pain evaluation with concerns for unstable angina in the setting of elevated troponin with prior history of significant CAD with CABG x 5 and multiple PCI's.  He is also noted to have possible community-acquired pneumonia and has been started on Rocephin and azithromycin.  2D echocardiogram pending, appreciate cardiology consultation with plans to start Imdur today given chest pain.  Plan to discontinue IV heparin drip in a.m.  This AM developed respiratory distress. 1/1: Discussed with daughter that I think he needs a palliative care consult.  Per cards: My suspicion is that his 2017 stent obtuse marginal graft is down and has been down for the last several weeks. He has been progressively getting weaker at home. His daughter [name] reports this. Unfortunately, I do not think that invasive management would be beneficial to him and I do not think that he is a candidate for cardiac catheterization. We have had multiple discussions about this. He does not have any specific target PCI from reviewing cath films.    Assessment & Plan:   Principal Problem:   Elevated troponin Active Problems:   Atherosclerotic heart disease of native coronary artery with angina pectoris ([name])   Respiratory failure, chronic ([name])   CAP (community acquired pneumonia)   Heart failure with reduced ejection fraction ([name])   Leukocytosis   Hyperkalemia   Acute kidney injury superimposed on chronic kidney disease ([place])   Uncontrolled type 2 diabetes mellitus with hyperglycemia  ([name])   ILD (interstitial lung disease) ([name])   GERD   History of bladder cancer  Assessment and Plan:  Elevated troponin CAD s/p CABG  Acute.   Patient presents with complaints of chest pain with exertion concerning for unstable angina.  High-sensitivity troponin elevated at 123.  Patient with prior history of CABG back in 2001 and multiple prior PCI's.  Last cardiac catheterization from 08/2015 noted patent LIMA- LAD, occluded SVG-RCA, SVG to diagonal with PCI/DES of SVG to OM for severe stenosis of proximal body of graft for in-stent restenosis and recommended life long DAPT with ASA/plavix.  Patient has been started on -Admit to a cardiac telemetry bed -Continue to trend high-sensitivity troponin. -Heparin drip stopped -Continue aspirin, Plavix, and  statin -Appreciate cardiology: We again had discussion that he is at increased risk for morbidity mortality.  We discussed palliative care due to his likely low yield with coronary intervention and significant risks renal failure if he undergoes this procedure on top of his overall declining status including his NSTEMI, worsening heart failure, and tenuous renal function.  Discussed this with primary team who also planned to involve palliative care since his recurrent chest pain has limited discharge.      Possible community-acquired pneumonia chronic respiratory failure with hypoxemia Patient requiring 2 L nasal cannula oxygen continuously but normally only on 2 L of nasal cannula oxygen at night.  Reports that he has had a cough.  Chest x-ray concerning for either  multifocal pneumonia and/or asymmetric pulmonary edema.  On differential also includes the possibility of aspiration given prior history of  achalasia and dysphagia. -Continue nasal cannula oxygen maintain O2 saturation greater than 92% -Follow-up influenza, COVID-19, and RSV screening -Procalcitonin not significantly elevated -Continue Rocephin and azithromycin.  De-escalate to oral  on discharge if not fully treated -x ray on 1/2 shows continued fluid so IV lasix given   Heart failure with reduced EF Suspect acute on chronic.  On physical exam patient does not appear grossly fluid overloaded.  Had been told to stop Lasix last week unless weight greater than 195.  BNP currently elevated at 447.8.  Last echocardiogram noted EF to be 40 to 45% with grade 1 diastolic dysfunction 33/8250.   -Strict I&Os and daily weights -IV lasix x 1 1/2   Leukocytosis Acute.  WBC elevated 10.6.  Question if this is reactive in nature or secondary to acute infection. -Procalcitonin is not significantly elevated, but continue antibiotics for now and monitor    Acute kidney injury superimposed on chronic kidney disease stage IV On admission creatinine elevated up to 2.85 with BUN 45.  Creatinine previously noted to be 2.35 on 10/30. -Continues to have some creatinine elevation due to IV Lasix with adequate urine output noted; discontinue IV Lasix today -Follows with Dr. Royce Macadamia outpatient   Diabetes mellitus type 2 with hyperglycemia, without long-term use of insulin Glucose elevated at 304.  Last hemoglobin A1c 8.7 in 01/18/2022.  Home medications only include glipizide and is not on insulin. -Hypoglycemia protocol -Hold glipizide -CBGs before every meal with moderate SSI -NovoLog 0 - 5 units nightly -Adjust insulin regimen as needed -Blood glucose levels improved   Bronchiectasis Patient followed by Dr. Estanislado Pandy of rheumatology and Dr. Chase Caller of pulmonology in the outpatient setting for bronchiectasis and mild interstitial lung disease in the setting [place] arthritis. -Continue outpatient follow-up  -aggressive pulm toilet   Essential hypertension -Currently with soft blood pressure readings, started on Imdur today per cardiology   History of bladder cancer -Continue outpatient follow-up with urology   History of seropositive rheumatoid arthritis    Hyperlipidemia -Continue Crestor   GERD -Continue Protonix    DVT prophylaxis:Heparin drip Code Status: Full Family Communication:called daughter 1/1 Disposition Plan: Per Cardiology Status is: Inpatient Remains inpatient appropriate because: Needs palliative care consultation   Consultants:  Cardiology Palliative care  Subjective: Continue to c/o SOB/weakness  Objective: Vitals:   04/06/22 0915 04/06/22 0956 04/06/22 1000 04/06/22 1201  BP: 129/81 (!) 128/94 133/79 121/77  Pulse: (!) 114 (!) 125  (!) 109  Resp:    19  Temp:    98 F (36.7 C)  TempSrc:    Oral  SpO2:  98%  97%  Weight:      Height:        Intake/Output Summary (Last 24 hours) at 04/06/2022 1340 Last data filed at 04/06/2022 0500 Gross per 24 hour  Intake 710 ml  Output 700 ml  Net 10 ml   Filed Weights   04/04/22 0655 04/05/22 0609 04/06/22 0448  Weight: 86.8 kg 87.3 kg 87.4 kg    Examination:   General: Appearance:     Overweight male who appears uncomfortable     Lungs:     Increased work of breathing, diminished   Heart:    Tachycardic.   MS:   All extremities are intact.   Neurologic:   Awake, cooperative       Data Reviewed: I have personally reviewed following labs and imaging studies  CBC: Recent Labs  Lab 04/03/22 1221 04/04/22 0028 04/05/22 0141 04/06/22 0245  WBC 10.6* 10.8* 9.7 9.1  HGB 11.5* 11.7* 11.2* 11.5*  HCT 35.3* 36.9* 32.9* 34.5*  MCV 87.8 87.9 85.7 84.6  PLT 194 184 190 161   Basic Metabolic Panel: Recent Labs  Lab 04/03/22 1221 04/04/22 0028 04/05/22 0141 04/06/22 0245  NA 131* 134* 132* 132*  K 5.4* 4.8 4.3 4.3  CL 97* 99 97* 96*  CO2 '27 22 23 22  '$ GLUCOSE 304* 163* 165* 164*  BUN 45* 47* 58* 65*  CREATININE 2.85* 2.91* 3.29* 3.00*  CALCIUM 9.3 9.4 9.0 9.2  MG  --   --  2.0  --    GFR: Estimated Creatinine Clearance: 18.7 mL/min (A) (by C-G formula based on SCr of 3 mg/dL (H)). Liver Function Tests: No results for input(s): "AST",  "ALT", "ALKPHOS", "BILITOT", "PROT", "ALBUMIN" in the last 168 hours. No results for input(s): "LIPASE", "AMYLASE" in the last 168 hours. No results for input(s): "AMMONIA" in the last 168 hours. Coagulation Profile: No results for input(s): "INR", "PROTIME" in the last 168 hours. Cardiac Enzymes: No results for input(s): "CKTOTAL", "CKMB", "CKMBINDEX", "TROPONINI" in the last 168 hours. BNP (last 3 results) No results for input(s): "PROBNP" in the last 8760 hours. HbA1C: No results for input(s): "HGBA1C" in the last 72 hours. CBG: Recent Labs  Lab 04/05/22 1205 04/05/22 1705 04/05/22 2104 04/06/22 0758 04/06/22 1204  GLUCAP 224* 202* 167* 186* 208*   Lipid Profile: No results for input(s): "CHOL", "HDL", "LDLCALC", "TRIG", "CHOLHDL", "LDLDIRECT" in the last 72 hours. Thyroid Function Tests: No results for input(s): "TSH", "T4TOTAL", "FREET4", "T3FREE", "THYROIDAB" in the last 72 hours. Anemia Panel: No results for input(s): "VITAMINB12", "FOLATE", "FERRITIN", "TIBC", "IRON", "RETICCTPCT" in the last 72 hours. Sepsis Labs: Recent Labs  Lab 04/03/22 1441 04/03/22 1633  PROCALCITON  --  0.19  LATICACIDVEN 1.7 1.2    Recent Results (from the past 240 hour(s))  Culture, blood (routine x 2)     Status: None (Preliminary result)   Collection Time: 04/03/22  2:41 PM   Specimen: BLOOD RIGHT HAND  Result Value Ref Range Status   Specimen Description BLOOD RIGHT HAND  Final   Special Requests   Final    BOTTLES DRAWN AEROBIC AND ANAEROBIC Blood Culture results may not be optimal due to an inadequate volume of blood received in culture bottles   Culture   Final    NO GROWTH 3 DAYS Performed at [facility], [place] [address], [place], [place] [address]    Report Status PENDING  Incomplete  Resp panel by RT-PCR (RSV, Flu A&B, Covid) Anterior Nasal Swab     Status: None   Collection Time: 04/03/22  3:00 PM   Specimen: Anterior Nasal Swab  Result Value Ref Range Status    SARS Coronavirus 2 by RT PCR NEGATIVE NEGATIVE Final    Comment: (NOTE) SARS-CoV-2 target nucleic acids are NOT DETECTED.  The SARS-CoV-2 RNA is generally detectable in upper respiratory specimens during the acute phase of infection. The lowest concentration of SARS-CoV-2 viral copies this assay can detect is 138 copies/mL. A negative result does not preclude SARS-Cov-2 infection and should not be used as the sole basis for treatment or other patient management decisions. A negative result may occur with  improper specimen collection/handling, submission of specimen other than nasopharyngeal swab, presence of viral mutation(s) within the areas targeted by this assay, and inadequate number of viral copies(<138 copies/mL). A negative result must be combined with clinical observations, patient history, and epidemiological information. The  expected result is Negative.  Fact Sheet for Patients:  EntrepreneurPulse.com.au  Fact Sheet for Healthcare Providers:  IncredibleEmployment.be  This test is no t yet approved or cleared by the [place] FDA and  has been authorized for detection and/or diagnosis of SARS-CoV-2 by FDA under an Emergency Use Authorization (EUA). This EUA will remain  in effect (meaning this test can be used) for the duration of the COVID-19 declaration under Section 564(b)(1) of the Act, 21 U.S.C.section 360bbb-3(b)(1), unless the authorization is terminated  or revoked sooner.       Influenza A by PCR NEGATIVE NEGATIVE Final   Influenza B by PCR NEGATIVE NEGATIVE Final    Comment: (NOTE) The Xpert Xpress SARS-CoV-2/FLU/RSV plus assay is intended as an aid in the diagnosis of influenza from Nasopharyngeal swab specimens and should not be used as a sole basis for treatment. Nasal washings and aspirates are unacceptable for Xpert Xpress SARS-CoV-2/FLU/RSV testing.  Fact Sheet for  Patients: EntrepreneurPulse.com.au  Fact Sheet for Healthcare Providers: IncredibleEmployment.be  This test is not yet approved or cleared by the [place] FDA and has been authorized for detection and/or diagnosis of SARS-CoV-2 by FDA under an Emergency Use Authorization (EUA). This EUA will remain in effect (meaning this test can be used) for the duration of the COVID-19 declaration under Section 564(b)(1) of the Act, 21 U.S.C. section 360bbb-3(b)(1), unless the authorization is terminated or revoked.     Resp Syncytial Virus by PCR NEGATIVE NEGATIVE Final    Comment: (NOTE) Fact Sheet for Patients: EntrepreneurPulse.com.au  Fact Sheet for Healthcare Providers: IncredibleEmployment.be  This test is not yet approved or cleared by the [place] FDA and has been authorized for detection and/or diagnosis of SARS-CoV-2 by FDA under an Emergency Use Authorization (EUA). This EUA will remain in effect (meaning this test can be used) for the duration of the COVID-19 declaration under Section 564(b)(1) of the Act, 21 U.S.C. section 360bbb-3(b)(1), unless the authorization is terminated or revoked.  Performed at [name], [place] [address] [place], [place] [address]   Culture, blood (routine x 2)     Status: None (Preliminary result)   Collection Time: 04/03/22  4:33 PM   Specimen: BLOOD  Result Value Ref Range Status   Specimen Description BLOOD RIGHT ANTECUBITAL  Final   Special Requests   Final    BOTTLES DRAWN AEROBIC ONLY Blood Culture results may not be optimal due to an inadequate volume of blood received in culture bottles   Culture   Final    NO GROWTH 3 DAYS Performed at [facility], [place] [address], [place], [place] [address]    Report Status PENDING  Incomplete         Radiology Studies: DG CHEST PORT 1 VIEW  Result Date: 04/06/2022 CLINICAL DATA:  Dyspnea. EXAM:  PORTABLE CHEST 1 VIEW COMPARISON:  04/03/2022 FINDINGS: Stable mild cardiac enlargement. Previous median sternotomy and CABG procedure. Moderate increased interstitial markings are identified throughout both lungs. Blunting of the costophrenic angles. Decreased aeration to the left base may represent atelectasis or airspace disease. IMPRESSION: 1. Moderate increased interstitial markings throughout both lungs compatible with CHF. 2. Decreased aeration to the left base may represent atelectasis or airspace disease. Electronically Signed   By: Kerby Moors M.D.   On: 04/06/2022 13:13        Scheduled Meds:  aspirin EC  81 mg Oral QHS   clopidogrel  75 mg Oral Daily   finasteride  5 mg  Oral Daily   furosemide  40 mg Intravenous Once   guaiFENesin  600 mg Oral BID   insulin aspart  0-15 Units Subcutaneous TID WC   insulin aspart  0-5 Units Subcutaneous QHS   isosorbide mononitrate  30 mg Oral Once   [START ON 04/07/2022] isosorbide mononitrate  60 mg Oral Daily   metoprolol succinate  25 mg Oral Daily   nortriptyline  10-20 mg Oral QHS   pantoprazole  40 mg Oral Daily   pregabalin  50 mg Oral QHS   rosuvastatin  10 mg Oral Daily   sodium chloride flush  3 mL Intravenous Q12H   tamsulosin  0.4 mg Oral QHS   umeclidinium bromide  1 puff Inhalation Daily   Continuous Infusions:  azithromycin 500 mg (04/05/22 1534)   cefTRIAXone (ROCEPHIN)  IV 2 g (04/05/22 1441)     LOS: 3 days    Time spent: 35 minutes    Geradine Girt, DO [facility]  If 7PM-7AM, please contact night-coverage [email or web address] 04/06/2022, 1:40 PM

## 2022-04-06 NOTE — Progress Notes (Addendum)
Progress Note  Patient Name: [name] Date of Encounter: 04/06/2022  Primary Cardiologist: [name], MD   Subjective   Patient has return of chest pain this morning that was responsive to nitroglycerin but returned a couple hours later.  EKG did not show any significant changes from admission EKG.  Vitals showed mild elevation in his tachycardia but stable blood pressure and good oxygen saturation on supplemental O2.  Stated that he was more willing to accept the risk of dialysis with coronary intervention.  Discussed the very low likelihood of finding a lesion to be reversible by stenting.  Discussed the extent of his cardiac disease and renal disease as well as his poor prognosis overall.  Discussed palliative care.  Inpatient Medications    Scheduled Meds:  aspirin EC  81 mg Oral QHS   clopidogrel  75 mg Oral Daily   finasteride  5 mg Oral Daily   furosemide  40 mg Intravenous Once   guaiFENesin  600 mg Oral BID   insulin aspart  0-15 Units Subcutaneous TID WC   insulin aspart  0-5 Units Subcutaneous QHS   isosorbide mononitrate  30 mg Oral Daily   metoprolol succinate  25 mg Oral Daily   nortriptyline  10-20 mg Oral QHS   pantoprazole  40 mg Oral Daily   pregabalin  50 mg Oral QHS   rosuvastatin  10 mg Oral Daily   sodium chloride flush  3 mL Intravenous Q12H   tamsulosin  0.4 mg Oral QHS   umeclidinium bromide  1 puff Inhalation Daily   Continuous Infusions:  azithromycin 500 mg (04/05/22 1534)   cefTRIAXone (ROCEPHIN)  IV 2 g (04/05/22 1441)   PRN Meds: acetaminophen **OR** acetaminophen, albuterol, hydrALAZINE, HYDROcodone-acetaminophen, hydrOXYzine, nitroGLYCERIN, sodium chloride   Vital Signs    Vitals:   04/06/22 0754 04/06/22 0915 04/06/22 0956 04/06/22 1000  BP: 131/79 129/81 (!) 128/94 133/79  Pulse: (!) 114 (!) 114 (!) 125   Resp: 18     Temp: 97.7 F (36.5 C)     TempSrc: Oral     SpO2: 96%  98%   Weight:      Height:         Intake/Output Summary (Last 24 hours) at 04/06/2022 1158 Last data filed at 04/06/2022 0500 Gross per 24 hour  Intake 710 ml  Output 700 ml  Net 10 ml   Filed Weights   04/04/22 0655 04/05/22 0609 04/06/22 0448  Weight: 86.8 kg 87.3 kg 87.4 kg    Telemetry    Sinus tachycardia up to the 130s with occasional PVC's- Personally Reviewed  ECG    ST depression in V3 through V6, new on admission but stable today- Personally Reviewed  Physical Exam   GEN: No acute distress  Cardiac: Tachycardic regular rhythm  Respiratory: Clear to auscultation bilaterally. GI: Soft, nontender, non-distended  MS: No edema; No deformity.  No chest wall tenderness to palpation Neuro:  Nonfocal  Psych: Normal affect   Labs    Chemistry Recent Labs  Lab 04/04/22 0028 04/05/22 0141 04/06/22 0245  NA 134* 132* 132*  K 4.8 4.3 4.3  CL 99 97* 96*  CO2 '22 23 22  '$ GLUCOSE 163* 165* 164*  BUN 47* 58* 65*  CREATININE 2.91* 3.29* 3.00*  CALCIUM 9.4 9.0 9.2  GFRNONAA 20* 17* 19*  ANIONGAP '13 12 14     '$ Hematology Recent Labs  Lab 04/04/22 0028 04/05/22 0141 04/06/22 0245  WBC 10.8* 9.7 9.1  RBC 4.20* 3.84* 4.08*  HGB 11.7* 11.2* 11.5*  HCT 36.9* 32.9* 34.5*  MCV 87.9 85.7 84.6  MCH 27.9 29.2 28.2  MCHC 31.7 34.0 33.3  RDW 14.4 14.1 14.0  PLT 184 190 207    Cardiac EnzymesNo results for input(s): "TROPONINI" in the last 168 hours. No results for input(s): "TROPIPOC" in the last 168 hours.   BNP Recent Labs  Lab 04/03/22 1221  BNP 447.8*     DDimer No results for input(s): "DDIMER" in the last 168 hours.   Radiology    No results found.  Cardiac Studies   Cardiac Catheterization 08/13/15 LM ost 80 LAD prox 100 OM2 90 RCA prox 100 L-LAD ok S-OM1/OM2 prox 99 ISR S-RCA 100 (CTO) S-D1 prox 100 (CTO) PCI:  3.5 x 12 mm Resolute DES to S-OM1/OM2    Diagnostic Dominance: Right  Intervention     Patient Profile     89 y.o. male CAD s/p CABG x 5 in 2001 as well  as multiple PCI, ILD, aortic stenosis, CKD stage IV, and HFrEF admitted for NSTEMI with worsening heart failure (EF 25 to 30%) and worsening renal function.  Assessment & Plan    Non-ST elevation myocardial infarction - Given his advanced chronic kidney disease and progressing near end-stage heart failure recommend continuing medical management.  We again discussed his overall prognosis which is very poor and that a heart cath now would likely not show any stentable areas. - It is likely that his SVG to OM graft has once again restenosed.  EF is down.  His troponin however is only mildly elevated ranging from 123 down to 111.  Prior to this admission his troponins have been mildly elevated chronically at around 40-70. - He continues to have recurrent angina.  Repeat EKG is stable and shows ST depression V3 through V6.  This finding is new when compared EKG prior to admission but stable during admission. - We will increase isosorbide to 60 mg a day. --Plavix '75mg'$  for at least one year.   Ischemic cardiomyopathy - ECHO this admit with EF 25-30%, global hypokinesis. Prior  echocardiogram February 01, 2022 showed EF of 40 to 45% with basal inferior akinesis.  Received IV Lasix 12/31. Creat increased again. Hold off on further lasix. Appears euvolemic.  -- With renal function and improving but borderline low blood pressures we have little room for GDMT.  Will increase Toprol XL 25 mg daily and Imdur to 60 mg daily as an antianginal.  No room for Entresto, SGLT2, spironolactone.   Aortic stenosis - Mild to moderate, 14 mmHg previously in OCT. Now with reduced EF may have more significant low flow low gradient severe AS.  Would not be a candidate for TAVR.   Chronic kidney disease stage IV - Follows with nephrology, Dr. Royce Macadamia as outpatient. Creatinine improved this morning from 3.3-3.0.    Chronic pain - Recent visit with Dr. Silvio Pate noted increasing problems with pain and request to restart chronic  narcotic therapy.   We again had discussion that he is at increased risk for morbidity mortality.  We discussed palliative care due to his likely low yield with coronary intervention and significant risks renal failure if he undergoes this procedure on top of his overall declining status including his NSTEMI, worsening heart failure, and tenuous renal function.  Discussed this with primary team who also planned to involve palliative care since his recurrent chest pain has limited discharge.    For questions or updates, please contact  [facility] Please consult [email or web address] for contact info under Cardiology/STEMI.      Signed, Johny Blamer, DO  04/06/2022, 11:58 AM    Internal Medicine Resident, PGY-1 Pager# [phone]  Personally seen and examined. Agree with above.  89 year old with progressive decline in systolic function over the last several months, recurrent angina, known multivessel coronary artery disease with last intervention in 2017 SVG to obtuse marginal branch with known patent LIMA to LAD, occluded SVG to RCA and SVG to diagonal.  Personally reviewed cardiac catheterization.  My suspicion is that his 2017 stent obtuse marginal graft is down and has been down for the last several weeks.  He has been progressively getting weaker at home.  His daughter [name] reports this.  Unfortunately, I do not think that invasive management would be beneficial to him and I do not think that he is a candidate for cardiac catheterization.  We have had multiple discussions about this.  He does not have any specific target PCI from reviewing cath films.  Feeling short of breath at times.  Occasional chest discomfort.  Giving him nitroglycerin as needed.  We are increasing his isosorbide from 30 up to 60.  We have started metoprolol low-dose for goal-directed medical therapy as well as antianginal support.  Tachycardia noted.  He is on both aspirin and Plavix.  Discussed his case with Dr.  Eliseo Squires as well.  She is consulting the palliative care team for further goals of care discussion.  He reports that he does not like coming over to the hospital. I think at this point given his severe multivessel coronary disease with markedly reduced ejection fraction currently and ongoing clinical decline at home and given his advanced age of 89 years old, he would be a candidate for hospice.  His mortality over the next 6 months is high.   Candee Furbish, MD

## 2022-04-06 NOTE — Progress Notes (Signed)
Palliative-   Consult received, chart reviewed.   I called and spoke with patient's daughter [name]. Plan to meet with patient and his daughter tomorrow at [place], AGNP-C Palliative Medicine  No charge

## 2022-04-06 NOTE — Progress Notes (Signed)
PT Cancellation Note  Patient Details Name: [name] MRN: [id number] DOB: 1932/02/11   Cancelled Treatment:    Reason Eval/Treat Not Completed: Patient declined, pt stating he doesn't feel good and is too tired.     Shary Decamp [facility] 04/06/2022, 12:23 PM [place] Office [phone]

## 2022-04-06 NOTE — Progress Notes (Signed)
Pt c/o chest pain 8/10 . X 3 doses of SL NTG given. EKG performed. HOB elevated. Pt reported pain relief. Remains tachycardic. On-call cardiologist paged regarding rate control. Response pending. Pt feeling better.     04/06/22 0626  Vitals  BP 120/76  MAP (mmHg) 89  BP Location Right Arm  BP Method Automatic  Patient Position (if appropriate) Sitting  Pulse Rate (!) 120  Pulse Rate Source Monitor  ECG Heart Rate (!) 118  Resp 18  MEWS COLOR  MEWS Score Color Yellow  Oxygen Therapy  SpO2 96 %  O2 Device Nasal Cannula  Pain Assessment  Pain Scale 0-10  Pain Score 0  MEWS Score  MEWS Temp 0  MEWS Systolic 0  MEWS Pulse 2  MEWS RR 0  MEWS LOC 0  MEWS Score 2

## 2022-04-07 DIAGNOSIS — I214 Non-ST elevation (NSTEMI) myocardial infarction: Principal | ICD-10-CM

## 2022-04-07 DIAGNOSIS — J9611 Chronic respiratory failure with hypoxia: Secondary | ICD-10-CM | POA: Diagnosis not present

## 2022-04-07 DIAGNOSIS — R7989 Other specified abnormal findings of blood chemistry: Secondary | ICD-10-CM | POA: Diagnosis not present

## 2022-04-07 DIAGNOSIS — Z7189 Other specified counseling: Secondary | ICD-10-CM

## 2022-04-07 DIAGNOSIS — I509 Heart failure, unspecified: Secondary | ICD-10-CM

## 2022-04-07 DIAGNOSIS — J849 Interstitial pulmonary disease, unspecified: Secondary | ICD-10-CM | POA: Diagnosis not present

## 2022-04-07 LAB — GLUCOSE, CAPILLARY
Glucose-Capillary: 185 mg/dL — ABNORMAL HIGH (ref 70–99)
Glucose-Capillary: 211 mg/dL — ABNORMAL HIGH (ref 70–99)
Glucose-Capillary: 203 mg/dL — ABNORMAL HIGH (ref 70–99)
Glucose-Capillary: 252 mg/dL — ABNORMAL HIGH (ref 70–99)
Glucose-Capillary: 173 mg/dL — ABNORMAL HIGH (ref 70–99)

## 2022-04-07 LAB — CBC
HCT: 35.1 % — ABNORMAL LOW (ref 39.0–52.0)
Hemoglobin: 11.8 g/dL — ABNORMAL LOW (ref 13.0–17.0)
MCH: 28.3 pg (ref 26.0–34.0)
MCHC: 33.6 g/dL (ref 30.0–36.0)
MCV: 84.2 fL (ref 80.0–100.0)
Platelets: 237 10*3/uL (ref 150–400)
RBC: 4.17 MIL/uL — ABNORMAL LOW (ref 4.22–5.81)
RDW: 13.9 % (ref 11.5–15.5)
WBC: 10.7 10*3/uL — ABNORMAL HIGH (ref 4.0–10.5)
nRBC: 0 % (ref 0.0–0.2)

## 2022-04-07 LAB — BASIC METABOLIC PANEL
Anion gap: 14 (ref 5–15)
BUN: 72 mg/dL — ABNORMAL HIGH (ref 8–23)
CO2: 21 mmol/L — ABNORMAL LOW (ref 22–32)
Calcium: 9.3 mg/dL (ref 8.9–10.3)
Chloride: 96 mmol/L — ABNORMAL LOW (ref 98–111)
Creatinine, Ser: 2.83 mg/dL — ABNORMAL HIGH (ref 0.61–1.24)
GFR, Estimated: 21 mL/min — ABNORMAL LOW (ref >60)
Glucose, Bld: 175 mg/dL — ABNORMAL HIGH (ref 70–99)
Potassium: 4 mmol/L (ref 3.5–5.1)
Sodium: 131 mmol/L — ABNORMAL LOW (ref 135–145)

## 2022-04-07 MED ORDER — OXYCODONE HCL 5 MG/5ML PO SOLN
2.5000 mg | ORAL | Status: DC | PRN
  Administered 2022-04-07 – 2022-04-12 (×10): 2.5 mg via ORAL
  Filled 2022-04-07 (×12): qty 5

## 2022-04-07 MED ORDER — LORAZEPAM 0.5 MG PO TABS
0.2500 mg | ORAL_TABLET | Freq: Four times a day (QID) | ORAL | Status: DC | PRN
  Administered 2022-04-07 – 2022-04-12 (×6): 0.25 mg via ORAL
  Filled 2022-04-07 (×7): qty 1

## 2022-04-07 MED ORDER — FUROSEMIDE 10 MG/ML IJ SOLN
40.0000 mg | Freq: Once | INTRAMUSCULAR | Status: AC
  Administered 2022-04-07: 40 mg via INTRAVENOUS
  Filled 2022-04-07: qty 4

## 2022-04-07 NOTE — Progress Notes (Signed)
PROGRESS NOTE    [name]  TTS:[id number] DOB: Mar 08, 1932 DOA: 04/03/2022 PCP: [name], MD   Brief Narrative:    [name] is a 88 y.o. male with medical history significant of diastolic CHF, CAD, interstitial lung disease, nocturnal hypoxemia on 2 L of nasal cannula oxygen, bladder cancer, rheumatoid arthritis, and CKD stage IV presents with complaints of chest pain over the last 2 days.  He has been admitted for chest pain evaluation with concerns for unstable angina in the setting of elevated troponin with prior history of significant CAD with CABG x 5 and multiple PCI's.  He is also noted to have possible community-acquired pneumonia and has been started on Rocephin and azithromycin.  2D echocardiogram pending, appreciate cardiology consultation with plans to start Imdur today given chest pain.  Plan to discontinue IV heparin drip in a.m.  This AM developed respiratory distress. 1/1: Discussed with daughter that I think he needs a palliative care consult.  Per cards: Multiple conversations have taken place regarding the severity of his illness. He is at high risk for mortality/further morbidity over the next 51-month    Assessment & Plan:   Principal Problem:   Elevated troponin Active Problems:   Atherosclerotic heart disease of native coronary artery with angina pectoris ([name])   Respiratory failure, chronic ([name])   CAP (community acquired pneumonia)   Heart failure with reduced ejection fraction ([name])   Leukocytosis   Hyperkalemia   Acute kidney injury superimposed on chronic kidney disease ([place]   Uncontrolled type 2 diabetes mellitus with hyperglycemia ([name])   ILD (interstitial lung disease) ([name])   GERD   History of bladder cancer  Assessment and Plan:  Elevated troponin CAD s/p CABG  Acute.   Patient presents with complaints of chest pain with exertion concerning for unstable angina.  High-sensitivity troponin elevated at 123.  Patient with prior  history of CABG back in 2001 and multiple prior PCI's.  Last cardiac catheterization from 08/2015 noted patent LIMA- LAD, occluded SVG-RCA, SVG to diagonal with PCI/DES of SVG to OM for severe stenosis of proximal body of graft for in-stent restenosis and recommended life long DAPT with ASA/plavix.  -Heparin drip stopped -Continue aspirin, Plavix, and  statin -Appreciate cardiology: We again had discussion that he is at increased risk for morbidity mortality.  We discussed palliative care due to his likely low yield with coronary intervention and significant risks renal failure if he undergoes this procedure on top of his overall declining status including his NSTEMI, worsening heart failure, and tenuous renal function.  Discussed this with primary team who also planned to involve palliative care since his recurrent chest pain has limited discharge.      Possible community-acquired pneumonia chronic respiratory failure with hypoxemia Patient requiring 2 L nasal cannula oxygen continuously but normally only on 2 L of nasal cannula oxygen at night.  Reports that he has had a cough.  Chest x-ray concerning for either  multifocal pneumonia and/or asymmetric pulmonary edema.  On differential also includes the possibility of aspiration given prior history of achalasia and dysphagia. -Continue nasal cannula oxygen maintain O2 saturation greater than 92% -Follow-up influenza, COVID-19, and RSV screening -Procalcitonin not significantly elevated -Continue Rocephin and azithromycin.  De-escalate to oral on discharge if not fully treated -x ray on 1/2 shows continued fluid so IV lasix given   Heart failure with reduced EF Suspect acute on chronic.  On physical exam patient does not appear grossly fluid overloaded.  Had  been told to stop Lasix last week unless weight greater than 195.  BNP currently elevated at 447.8.  Last echocardiogram noted EF to be 40 to 45% with grade 1 diastolic dysfunction 42/5956.    -Strict I&Os and daily weights -IV lasix x 1 1/2   Leukocytosis Acute.  WBC elevated 10.6.  Question if this is reactive in nature or secondary to acute infection. -Procalcitonin is not significantly elevated, but continue antibiotics for now and monitor    Acute kidney injury superimposed on chronic kidney disease stage IV On admission creatinine elevated up to 2.85 with BUN 45.  Creatinine previously noted to be 2.35 on 10/30. -Continues to have some creatinine elevation due to IV Lasix with adequate urine output noted; discontinue IV Lasix today -Follows with Dr. Royce Macadamia outpatient   Diabetes mellitus type 2 with hyperglycemia, without long-term use of insulin Glucose elevated at 304.  Last hemoglobin A1c 8.7 in 01/18/2022.  Home medications only include glipizide and is not on insulin. -Hypoglycemia protocol -Hold glipizide -CBGs before every meal with moderate SSI -NovoLog 0 - 5 units nightly -Adjust insulin regimen as needed   Bronchiectasis Patient followed by Dr. Estanislado Pandy of rheumatology and Dr. Chase Caller of pulmonology in the outpatient setting for bronchiectasis and mild interstitial lung disease in the setting [place] arthritis. -Continue outpatient follow-up  -aggressive pulm toilet   Essential hypertension -Currently with soft blood pressure readings, started on Imdur today per cardiology   History of bladder cancer -Continue outpatient follow-up with urology   History of seropositive rheumatoid arthritis   Hyperlipidemia -Continue Crestor   GERD -Continue Protonix    DVT prophylaxis:Heparin drip Code Status: Full Family Communication:called daughter 1/1 Disposition Plan: Per Cardiology Status is: Inpatient Remains inpatient appropriate because: Needs palliative care consultation   Consultants:  Cardiology Palliative care  Subjective: Continues to c/o SOB   Objective: Vitals:   04/07/22 0826 04/07/22 0934 04/07/22 0947 04/07/22 1112  BP:  129/86  (!) 129/92 132/86  Pulse: (!) 110  (!) 112 (!) 112  Resp: 18   20  Temp: 97.8 F (36.6 C)   97.7 F (36.5 C)  TempSrc: Axillary   Oral  SpO2: 92% 93%  94%  Weight:      Height:        Intake/Output Summary (Last 24 hours) at 04/07/2022 1229 Last data filed at 04/07/2022 1100 Gross per 24 hour  Intake --  Output 2250 ml  Net -2250 ml   Filed Weights   04/05/22 0609 04/06/22 0448 04/07/22 0506  Weight: 87.3 kg 87.4 kg 85.6 kg    Examination:   General: Appearance:     Overweight male who appears winded     Lungs:     Increased work of breathing, diminished , on [place]  Heart:    Tachycardic.   MS:   All extremities are intact.   Neurologic:   Awake, cooperative       Data Reviewed: I have personally reviewed following labs and imaging studies  CBC: Recent Labs  Lab 04/03/22 1221 04/04/22 0028 04/05/22 0141 04/06/22 0245 04/07/22 0148  WBC 10.6* 10.8* 9.7 9.1 10.7*  HGB 11.5* 11.7* 11.2* 11.5* 11.8*  HCT 35.3* 36.9* 32.9* 34.5* 35.1*  MCV 87.8 87.9 85.7 84.6 84.2  PLT 194 184 190 207 387   Basic Metabolic Panel: Recent Labs  Lab 04/03/22 1221 04/04/22 0028 04/05/22 0141 04/06/22 0245 04/07/22 0148  NA 131* 134* 132* 132* 131*  K 5.4* 4.8 4.3 4.3 4.0  CL  97* 99 97* 96* 96*  CO2 '27 22 23 22 '$ 21*  GLUCOSE 304* 163* 165* 164* 175*  BUN 45* 47* 58* 65* 72*  CREATININE 2.85* 2.91* 3.29* 3.00* 2.83*  CALCIUM 9.3 9.4 9.0 9.2 9.3  MG  --   --  2.0  --   --    GFR: Estimated Creatinine Clearance: 19.8 mL/min (A) (by C-G formula based on SCr of 2.83 mg/dL (H)). Liver Function Tests: No results for input(s): "AST", "ALT", "ALKPHOS", "BILITOT", "PROT", "ALBUMIN" in the last 168 hours. No results for input(s): "LIPASE", "AMYLASE" in the last 168 hours. No results for input(s): "AMMONIA" in the last 168 hours. Coagulation Profile: No results for input(s): "INR", "PROTIME" in the last 168 hours. Cardiac Enzymes: No results for input(s): "CKTOTAL", "CKMB",  "CKMBINDEX", "TROPONINI" in the last 168 hours. BNP (last 3 results) No results for input(s): "PROBNP" in the last 8760 hours. HbA1C: No results for input(s): "HGBA1C" in the last 72 hours. CBG: Recent Labs  Lab 04/06/22 1600 04/06/22 1800 04/06/22 2212 04/07/22 0826 04/07/22 1111  GLUCAP 212* 227* 159* 185* 211*   Lipid Profile: No results for input(s): "CHOL", "HDL", "LDLCALC", "TRIG", "CHOLHDL", "LDLDIRECT" in the last 72 hours. Thyroid Function Tests: No results for input(s): "TSH", "T4TOTAL", "FREET4", "T3FREE", "THYROIDAB" in the last 72 hours. Anemia Panel: No results for input(s): "VITAMINB12", "FOLATE", "FERRITIN", "TIBC", "IRON", "RETICCTPCT" in the last 72 hours. Sepsis Labs: Recent Labs  Lab 04/03/22 1441 04/03/22 1633  PROCALCITON  --  0.19  LATICACIDVEN 1.7 1.2    Recent Results (from the past 240 hour(s))  Culture, blood (routine x 2)     Status: None (Preliminary result)   Collection Time: 04/03/22  2:41 PM   Specimen: BLOOD RIGHT HAND  Result Value Ref Range Status   Specimen Description BLOOD RIGHT HAND  Final   Special Requests   Final    BOTTLES DRAWN AEROBIC AND ANAEROBIC Blood Culture results may not be optimal due to an inadequate volume of blood received in culture bottles   Culture   Final    NO GROWTH 4 DAYS Performed at [facility], [place] [address] [place],  [address]    Report Status PENDING  Incomplete  Resp panel by RT-PCR (RSV, Flu A&B, Covid) Anterior Nasal Swab     Status: None   Collection Time: 04/03/22  3:00 PM   Specimen: Anterior Nasal Swab  Result Value Ref Range Status   SARS Coronavirus 2 by RT PCR NEGATIVE NEGATIVE Final    Comment: (NOTE) SARS-CoV-2 target nucleic acids are NOT DETECTED.  The SARS-CoV-2 RNA is generally detectable in upper respiratory specimens during the acute phase of infection. The lowest concentration of SARS-CoV-2 viral copies this assay can detect is 138 copies/mL. A negative result  does not preclude SARS-Cov-2 infection and should not be used as the sole basis for treatment or other patient management decisions. A negative result may occur with  improper specimen collection/handling, submission of specimen other than nasopharyngeal swab, presence of viral mutation(s) within the areas targeted by this assay, and inadequate number of viral copies(<138 copies/mL). A negative result must be combined with clinical observations, patient history, and epidemiological information. The expected result is Negative.  Fact Sheet for Patients:  EntrepreneurPulse.com.au  Fact Sheet for Healthcare Providers:  IncredibleEmployment.be  This test is no t yet approved or cleared by the [place] FDA and  has been authorized for detection and/or diagnosis of SARS-CoV-2 by FDA under an Emergency Use  Authorization (EUA). This EUA will remain  in effect (meaning this test can be used) for the duration of the COVID-19 declaration under Section 564(b)(1) of the Act, 21 U.S.C.section 360bbb-3(b)(1), unless the authorization is terminated  or revoked sooner.       Influenza A by PCR NEGATIVE NEGATIVE Final   Influenza B by PCR NEGATIVE NEGATIVE Final    Comment: (NOTE) The Xpert Xpress SARS-CoV-2/FLU/RSV plus assay is intended as an aid in the diagnosis of influenza from Nasopharyngeal swab specimens and should not be used as a sole basis for treatment. Nasal washings and aspirates are unacceptable for Xpert Xpress SARS-CoV-2/FLU/RSV testing.  Fact Sheet for Patients: EntrepreneurPulse.com.au  Fact Sheet for Healthcare Providers: IncredibleEmployment.be  This test is not yet approved or cleared by the [place] FDA and has been authorized for detection and/or diagnosis of SARS-CoV-2 by FDA under an Emergency Use Authorization (EUA). This EUA will remain in effect (meaning this test can be used) for  the duration of the COVID-19 declaration under Section 564(b)(1) of the Act, 21 U.S.C. section 360bbb-3(b)(1), unless the authorization is terminated or revoked.     Resp Syncytial Virus by PCR NEGATIVE NEGATIVE Final    Comment: (NOTE) Fact Sheet for Patients: EntrepreneurPulse.com.au  Fact Sheet for Healthcare Providers: IncredibleEmployment.be  This test is not yet approved or cleared by the [place] FDA and has been authorized for detection and/or diagnosis of SARS-CoV-2 by FDA under an Emergency Use Authorization (EUA). This EUA will remain in effect (meaning this test can be used) for the duration of the COVID-19 declaration under Section 564(b)(1) of the Act, 21 U.S.C. section 360bbb-3(b)(1), unless the authorization is terminated or revoked.  Performed at [facility], [place] [address] [place], [place] [address]   Culture, blood (routine x 2)     Status: None (Preliminary result)   Collection Time: 04/03/22  4:33 PM   Specimen: BLOOD  Result Value Ref Range Status   Specimen Description BLOOD RIGHT ANTECUBITAL  Final   Special Requests   Final    BOTTLES DRAWN AEROBIC ONLY Blood Culture results may not be optimal due to an inadequate volume of blood received in culture bottles   Culture   Final    NO GROWTH 4 DAYS Performed at [facility], [place] [address], [place], [place] [address]    Report Status PENDING  Incomplete         Radiology Studies: DG CHEST PORT 1 VIEW  Result Date: 04/06/2022 CLINICAL DATA:  Dyspnea. EXAM: PORTABLE CHEST 1 VIEW COMPARISON:  04/03/2022 FINDINGS: Stable mild cardiac enlargement. Previous median sternotomy and CABG procedure. Moderate increased interstitial markings are identified throughout both lungs. Blunting of the costophrenic angles. Decreased aeration to the left base may represent atelectasis or airspace disease. IMPRESSION: 1. Moderate increased interstitial markings  throughout both lungs compatible with CHF. 2. Decreased aeration to the left base may represent atelectasis or airspace disease. Electronically Signed   By: Kerby Moors M.D.   On: 04/06/2022 13:13        Scheduled Meds:  aspirin EC  81 mg Oral QHS   clopidogrel  75 mg Oral Daily   finasteride  5 mg Oral Daily   guaiFENesin  600 mg Oral BID   insulin aspart  0-15 Units Subcutaneous TID WC   insulin aspart  0-5 Units Subcutaneous QHS   isosorbide mononitrate  60 mg Oral Daily   metoprolol succinate  25 mg Oral Daily   nortriptyline  10-20 mg Oral QHS   pantoprazole  40 mg Oral Daily   pregabalin  50 mg Oral QHS   rosuvastatin  10 mg Oral Daily   sodium chloride flush  3 mL Intravenous Q12H   tamsulosin  0.4 mg Oral QHS   umeclidinium bromide  1 puff Inhalation Daily   Continuous Infusions:  azithromycin 500 mg (04/06/22 1523)   cefTRIAXone (ROCEPHIN)  IV 2 g (04/06/22 1408)     LOS: 4 days    Time spent: 35 minutes    Geradine Girt, DO [facility]  If 7PM-7AM, please contact night-coverage [email or web address] 04/07/2022, 12:29 PM

## 2022-04-07 NOTE — Progress Notes (Signed)
RN returned 2.78m unused oxycodone '5mg'$ /528mto pharmacy. Medication wasted in stericycle in main pharmacy. Witness is JeHouston SirenPhEcolab CaLuisa HartPharmD, BCPS Clinical Pharmacist 04/07/2022 6:58 PM

## 2022-04-07 NOTE — Progress Notes (Addendum)
Rounding Note    Patient Name: [name] Date of Encounter: 04/07/2022  [facility] Cardiologist: [name], MD   Subjective   Patient is resting in bed this morning and reports feeling "50% better than yesterday."  When asked to clarify he says that he is no longer having chest pain.  Patient denies headache or other side effects of increased doses of Imdur and Toprol XL.  Inpatient Medications    Scheduled Meds:  aspirin EC  81 mg Oral QHS   clopidogrel  75 mg Oral Daily   finasteride  5 mg Oral Daily   furosemide  40 mg Intravenous Once   guaiFENesin  600 mg Oral BID   insulin aspart  0-15 Units Subcutaneous TID WC   insulin aspart  0-5 Units Subcutaneous QHS   isosorbide mononitrate  60 mg Oral Daily   metoprolol succinate  25 mg Oral Daily   nortriptyline  10-20 mg Oral QHS   pantoprazole  40 mg Oral Daily   pregabalin  50 mg Oral QHS   rosuvastatin  10 mg Oral Daily   sodium chloride flush  3 mL Intravenous Q12H   tamsulosin  0.4 mg Oral QHS   umeclidinium bromide  1 puff Inhalation Daily   Continuous Infusions:  azithromycin 500 mg (04/06/22 1523)   cefTRIAXone (ROCEPHIN)  IV 2 g (04/06/22 1408)   PRN Meds: acetaminophen **OR** acetaminophen, albuterol, hydrALAZINE, HYDROcodone-acetaminophen, hydrOXYzine, nitroGLYCERIN, sodium chloride   Vital Signs    Vitals:   04/06/22 1554 04/06/22 2146 04/07/22 0006 04/07/22 0506  BP: (!) 117/97 126/75 116/77 121/70  Pulse: (!) 111 (!) 110 (!) 52 96  Resp: '20  16 20  '$ Temp: 97.6 F (36.4 C) 97.8 F (36.6 C) 97.8 F (36.6 C) 97.6 F (36.4 C)  TempSrc: Oral Oral Oral Oral  SpO2: 94% 96% 95% 90%  Weight:    85.6 kg  Height:        Intake/Output Summary (Last 24 hours) at 04/07/2022 0747 Last data filed at 04/07/2022 0051 Gross per 24 hour  Intake --  Output 1500 ml  Net -1500 ml      04/07/2022    5:06 AM 04/06/2022    4:48 AM 04/05/2022    6:09 AM  Last 3 Weights  Weight (lbs) 188 lb 11.4 oz  192 lb 10.9 oz 192 lb 7.4 oz  Weight (kg) 85.6 kg 87.4 kg 87.3 kg      Telemetry    Sinus tachycardia, rates in the low 100s. - Personally Reviewed  ECG    No new tracing  Physical Exam   GEN: No acute distress.  Chronically ill appearing.  Neck: No JVD Cardiac: RRR, tachycardia. No murmurs, rubs, or gallops.  Respiratory: Right side rhonchi. Left side without adventitious sounds GI: Soft, nontender, non-distended  MS: No edema; No deformity. Neuro:  Nonfocal  Psych: Normal affect   Labs    High Sensitivity Troponin:   Recent Labs  Lab 04/03/22 1221 04/03/22 1441 04/04/22 0821 04/04/22 1014  TROPONINIHS 123* 109* 111* 100*     Chemistry Recent Labs  Lab 04/05/22 0141 04/06/22 0245 04/07/22 0148  NA 132* 132* 131*  K 4.3 4.3 4.0  CL 97* 96* 96*  CO2 23 22 21*  GLUCOSE 165* 164* 175*  BUN 58* 65* 72*  CREATININE 3.29* 3.00* 2.83*  CALCIUM 9.0 9.2 9.3  MG 2.0  --   --   GFRNONAA 17* 19* 21*  ANIONGAP 12 14 14  Lipids No results for input(s): "CHOL", "TRIG", "HDL", "LABVLDL", "LDLCALC", "CHOLHDL" in the last 168 hours.  Hematology Recent Labs  Lab 04/05/22 0141 04/06/22 0245 04/07/22 0148  WBC 9.7 9.1 10.7*  RBC 3.84* 4.08* 4.17*  HGB 11.2* 11.5* 11.8*  HCT 32.9* 34.5* 35.1*  MCV 85.7 84.6 84.2  MCH 29.2 28.2 28.3  MCHC 34.0 33.3 33.6  RDW 14.1 14.0 13.9  PLT 190 207 237   Thyroid No results for input(s): "TSH", "FREET4" in the last 168 hours.  BNP Recent Labs  Lab 04/03/22 1221  BNP 447.8*    DDimer No results for input(s): "DDIMER" in the last 168 hours.   Radiology    DG CHEST PORT 1 VIEW  Result Date: 04/06/2022 CLINICAL DATA:  Dyspnea. EXAM: PORTABLE CHEST 1 VIEW COMPARISON:  04/03/2022 FINDINGS: Stable mild cardiac enlargement. Previous median sternotomy and CABG procedure. Moderate increased interstitial markings are identified throughout both lungs. Blunting of the costophrenic angles. Decreased aeration to the left base may  represent atelectasis or airspace disease. IMPRESSION: 1. Moderate increased interstitial markings throughout both lungs compatible with CHF. 2. Decreased aeration to the left base may represent atelectasis or airspace disease. Electronically Signed   By: Kerby Moors M.D.   On: 04/06/2022 13:13    Cardiac Studies   Cardiac Catheterization 08/13/15 LM ost 80 LAD prox 100 OM2 90 RCA prox 100 L-LAD ok S-OM1/OM2 prox 99 ISR S-RCA 100 (CTO) S-D1 prox 100 (CTO) PCI:  3.5 x 12 mm Resolute DES to S-OM1/OM2    Diagnostic Dominance: Right  Intervention     04/04/22 TTE  IMPRESSIONS     1. No LV thrombus seen with Definity contrast. Left ventricular ejection  fraction, by estimation, is 25 to 30%. The left ventricle has severely  decreased function. The left ventricle demonstrates regional wall motion  abnormalities (see scoring  diagram/findings for description). The left ventricular internal cavity  size was mildly dilated. There is mild concentric left ventricular  hypertrophy. Left ventricular diastolic parameters are consistent with  Grade II diastolic dysfunction  (pseudonormalization). Elevated left atrial pressure. There is severe  hypokinesis of the left ventricular, entire inferolateral wall. There is  severe hypokinesis of the left ventricular, entire apical segment. There  is mild hypokinesis of the left  ventricular, basal-mid anteroseptal wall, anterior wall and inferior wall.   2. Right ventricular systolic function is normal. The right ventricular  size is normal. Tricuspid regurgitation signal is inadequate for assessing  PA pressure.   3. Left atrial size was mildly dilated.   4. The mitral valve is normal in structure. Mild to moderate mitral valve  regurgitation. Moderate mitral annular calcification.   5. Low flow low gradient aortic stenosis. The aortic valve was not well  visualized. There is moderate calcification of the aortic valve. There is  moderate  thickening of the aortic valve. Aortic valve regurgitation is not  visualized. Moderate to severe  aortic valve stenosis.   6. The inferior vena cava is normal in size with greater than 50%  respiratory variability, suggesting right atrial pressure of 3 mmHg.   Comparison(s): The left ventricular function is significantly worse. The  left ventricular wall motion abnormalities are new.   FINDINGS   Left Ventricle: No LV thrombus seen with Definity contrast. Left  ventricular ejection fraction, by estimation, is 25 to 30%. The left  ventricle has severely decreased function. The left ventricle demonstrates  regional wall motion abnormalities. Severe  hypokinesis of the left ventricular, entire inferolateral  wall. Severe  hypokinesis of the left ventricular, entire apical segment. Mild  hypokinesis of the left ventricular, basal-mid anteroseptal wall, anterior  wall and inferior wall. Definity contrast  agent was given IV to delineate the left ventricular endocardial borders.  The left ventricular internal cavity size was mildly dilated. There is  mild concentric left ventricular hypertrophy. Left ventricular diastolic  parameters are consistent with Grade   II diastolic dysfunction (pseudonormalization). Elevated left atrial  pressure.   Right Ventricle: The right ventricular size is normal. No increase in  right ventricular wall thickness. Right ventricular systolic function is  normal. Tricuspid regurgitation signal is inadequate for assessing PA  pressure.   Left Atrium: Left atrial size was mildly dilated.   Right Atrium: Right atrial size was normal in size.   Pericardium: There is no evidence of pericardial effusion.   Mitral Valve: The mitral valve is normal in structure. There is mild  thickening of the mitral valve leaflet(s). Moderate mitral annular  calcification. Mild to moderate mitral valve regurgitation, with  centrally-directed jet.   Tricuspid Valve: The  tricuspid valve is normal in structure. Tricuspid  valve regurgitation is not demonstrated.   Aortic Valve: Low flow low gradient aortic stenosis. The aortic valve was  not well visualized. There is moderate calcification of the aortic valve.  There is moderate thickening of the aortic valve. Aortic valve  regurgitation is not visualized. Moderate  to severe aortic stenosis is present. Aortic valve mean gradient measures  12.0 mmHg. Aortic valve peak gradient measures 18.0 mmHg. Aortic valve  area, by VTI measures 1.03 cm.   Pulmonic Valve: The pulmonic valve was normal in structure. Pulmonic valve  regurgitation is not visualized.   Aorta: The aortic root and ascending aorta are structurally normal, with  no evidence of dilitation.   Venous: The inferior vena cava is normal in size with greater than 50%  respiratory variability, suggesting right atrial pressure of 3 mmHg.   IAS/Shunts: No atrial level shunt detected by color flow Doppler.    Patient Profile     89 y.o. male with chronic kidney disease creatinine approximately 2 at baseline with known coronary artery disease post bypass in 2001 with most recent stent placement to SVG OM.  Has occluded SVG to RCA and SVG to D1.  Here with non-ST elevation myocardial infarction.   Assessment & Plan    NSTEMI CAD s/p CABG  Hx of CAD status post CABG x5. Last cardiac catheterization May 2017 with patent LIMA-LAD, occluded SVG-RCA, SVG to diagonal with PCI/DES of SVG to OM for severe stenosis of proximal body of graft for in stent restenosis. Admitted with increasing weakness and epigastric pain that was responsive to nitro. Troponin 123 -> 111, up from baseline of 40-70. New ST depression in precordial leads V3 to V6.   Continue medical management given CKD stage IV. Suspect re-stenosis of SVG to OM graft. Unlikely to have PCI targets. Imdur increased to '60mg'$  daily yesterday with positive response. Continue Plavix '75mg'$ .  Acute on  chronic HFrEF (worsening EF) Ischemic cardiomyopathy  TTE this admission shows LVEF 25-30%, down from 40-45% this past October. Has received some lasix with subsequent creatinine bump.   Continue Toprol XL '25mg'$  QD Continue Imdur '60mg'$  QD for afterload reduction May need intermittent lasix to maintain euvolemia.  Unable to add additional GDMT 2/2 CKD and borderline BP Palliative care is now involved with family meeting planned for 3pm today. Agree with their involvement.  Aortic Stenosis, moderate to  severe (low flow, low gradient)  Mean gradient 12.1mHg. Peak gradient 179mg. VTI 1.03 cm2. Patient would not be a TAVR candidate.  CKD stage IV  Patient followed by Dr. FoRoyce Macadamiaith nephrology. Creatinine improved to 2.83 today.  Per primary team:  Chronic pain      For questions or updates, please contact [place] consult [email or web address] for contact info under        Signed, EvLily KocherPA-C  04/07/2022, 7:47 AM    Personally seen and examined. Agree with above.  Palliative care discussion today at 3 PM.  This will be excellent to establish goals of care.  Multiple conversations have taken place regarding the severity of his illness.  He is at high risk for mortality/further morbidity over the next 6-64-monthWe have given him an additional 40 mg of IV Lasix this morning.  Shortness of breath has improved.  Chest pain has improved.  MarCandee FurbishD

## 2022-04-07 NOTE — Progress Notes (Signed)
Mobility Specialist - Progress Note   04/07/22 1549  Mobility  Activity Ambulated with assistance to bathroom  Level of Assistance Minimal assist, patient does 75% or more  Assistive Device Front wheel walker  Distance Ambulated (ft) 20 ft  Activity Response Tolerated well  Mobility Referral Yes  $Mobility charge 1 Mobility   Pt was received in bed and requesting assistance to BR. Pt was MinA throughout ambulation. Pt with successful BM. Pt was returned to bed with all needs met.  Franki Monte  Mobility Specialist Please contact via Solicitor or Rehab office at [phone]

## 2022-04-07 NOTE — Consult Note (Signed)
Consultation Note Date: 04/07/2022   Patient Name: [name]  DOB: 07-16-1931  MRN: [id number]  Age / Sex: 89 y.o., male  PCP: [name], MD Referring Physician: Geradine Girt, DO  Reason for Consultation: GOC  HPI/Patient Profile: 89 y.o. male  with past medical history of RA with lung involvement, CKD, CHF, DM, CAD s/p CABG x5, macular degeneration admitted on 04/03/2022 with shortness of breath and chest pain. Workup reveals pneumonia, NSTEMI, worsening heart failure with TEE indicating CHF at 25-30% which is worse from October EF 40-45%. Acute on chronic kidney injury- Cr 2.83, trending down from max 3.29 after receiving lasix. Per Cardiology no good options for interventions. Palliative medicine consulted for [place].    Primary Decision Maker PATIENT  Discussion: I have reviewed medical records including Care Everywhere, progress notes from this and prior admissions, labs and imaging, discussed with RN.  On evaluation patient is awake alert oriented. He is SOB and report speaking is difficult. He defers conversation to his family, but he is able to participate in decision making.   We discussed a brief life review of the patient. 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   As far as functional and nutritional status- family has noted decline in the last year. On this admission he is able to ambulate with minimal assistance using a walker. Prior to admission he was independent with ADL's.    We discussed patient's current illness and what it means in the larger context of patient's on-going co-morbidities.  Natural disease trajectory and expectations at EOL were discussed.  I attempted to elicit values and goals of care important to the patient.    The difference between aggressive  medical intervention and comfort care was considered in light of the patient's goals of care.   Advance directives, concepts specific to code status, artificial feeding and hydration, and rehospitalization were considered and discussed.  Discussed with patient/family the importance of continued conversation with family and the medical providers regarding overall plan of care and treatment options, ensuring decisions are within the context of the patient's values and GOCs.    Hospice and Palliative Care services outpatient were explained and offered. Hard choices book was provided.  Questions and concerns were addressed. The family was encouraged to call with questions or concerns.   SUMMARY OF RECOMMENDATIONS -Code status changed to DNR -Maximize this hospitalization and then discharge home with hospice -TOC referral for hospice services referral -Oxycodone liquid 2.'5mg'$  po q2hr prn for shortness of breath -lorazepam .'025mg'$  sublingual for anxiety    Code Status/Advance Care Planning: DNR   Prognosis:   < 3 months  Discharge Planning: Home with Hospice  Primary Diagnoses: Present on Admission:  Elevated troponin  Atherosclerotic heart disease of native coronary artery with angina pectoris ([place])  ILD (interstitial lung disease) ([place])  Respiratory failure, chronic ([place])  CAP (community acquired pneumonia)  Heart failure with reduced ejection fraction ([name])  Leukocytosis  Hyperkalemia  Uncontrolled type 2 diabetes mellitus with hyperglycemia ([name])  GERD  Acute kidney injury superimposed on chronic kidney disease ([place])   Review of Systems  Constitutional:  Positive for activity change, appetite change and fatigue.    Physical Exam Vitals and nursing note reviewed.  Constitutional:      Appearance: He is ill-appearing.     Comments:  lethargic  Pulmonary:     Comments: Shortness of breath at rest Neurological:     Mental Status: He is oriented to person, place, and time.      Vital Signs: BP 132/86 (BP Location: Left Arm)   Pulse (!) 112   Temp 97.7 F (36.5 C) (Oral)   Resp 20   Ht 6' (1.829 m)   Wt 85.6 kg   SpO2 94%   BMI 25.59 kg/m  Pain Scale: 0-10   Pain Score: 0-No pain   SpO2: SpO2: 94 % O2 Device:SpO2: 94 % O2 Flow Rate: .O2 Flow Rate (L/min): 4 L/min  IO: Intake/output summary:  Intake/Output Summary (Last 24 hours) at 04/07/2022 1625 Last data filed at 04/07/2022 1100 Gross per 24 hour  Intake --  Output 1450 ml  Net -1450 ml    LBM: Last BM Date : 04/06/22 Baseline Weight: Weight: 88.5 kg Most recent weight: Weight: 85.6 kg       Thank you for this consult. Palliative medicine will continue to follow and assist as needed.  Time Total: 90 minutes Greater than 50%  of this time was spent counseling and coordinating care related to the above assessment and plan.  Signed by: Mariana Kaufman, AGNP-C Palliative Medicine    Please contact Palliative Medicine Team phone at [phone] for questions and concerns.  For individual provider: See Shea Evans

## 2022-04-07 NOTE — TOC Initial Note (Signed)
Transition of Care [facility]) - Initial/Assessment Note    Patient Details  Name: [name] MRN: [id number] Date of Birth: 10-11-1931  Transition of Care [facility] CM/SW Contact:    Bethena Roys, RN Phone Number: 04/07/2022, 4:47 PM  Clinical Narrative:  Risk for readmission assessment completed. PTA patient was from home with spouse. Palliative Meeting held today with daughter- family has agreed to [facility] in the Home. Case Manager spoke with daughter [name] regarding the Medicare.gov list for Hospice choice and the family chose [facility]. Case Manager submitted the referral and [facility] will review the patient on 04-08-22. Case Manager will continue to follow for transition of care needs.            Expected Discharge Plan: Home w Hospice Care Barriers to Discharge: Continued Medical Work up   Patient Goals and CMS Choice Patient states their goals for this hospitalization and ongoing recovery are:: Plan is for home with Hospcie. CMS Medicare.gov Compare Post Acute Care list provided to:: Patient Choice offered to / list presented to : Patient, Adult Children      Expected Discharge Plan and Services In-house Referral: NA Discharge Planning Services: CM Consult Post Acute Care Choice: Hospice Living arrangements for the past 2 months: Single Family Home                   DME Agency: NA   Prior Living Arrangements/Services Living arrangements for the past 2 months: Single Family Home Lives with:: Spouse (children alternate care) Patient language and need for interpreter reviewed:: Yes Do you feel safe going back to the place where you live?: Yes      Need for Family Participation in Patient Care: Yes (Comment) Care giver support system in place?: Yes (comment) Current home services: DME (patient has oxygen via [employer].) Criminal Activity/Legal Involvement Pertinent to Current Situation/Hospitalization: No - Comment as needed  Permission  Sought/Granted Permission sought to share information with : Family Supports, Customer service manager, Case Optician, dispensing granted to share information with : Yes, Verbal Permission Granted     Permission granted to share info w AGENCY: [place]        Emotional Assessment Appearance:: Appears stated age Attitude/Demeanor/Rapport: Unable to Assess Affect (typically observed): Unable to Assess   Alcohol / Substance Use: Not Applicable Psych Involvement: No (comment)  Admission diagnosis:  Elevated troponin [R79.89] Chest pain, unspecified type [R07.9] Patient Active Problem List   Diagnosis Date Noted   Elevated troponin 04/03/2022   CAP (community acquired pneumonia) 04/03/2022   Heart failure with reduced ejection fraction ([place]) 04/03/2022   Leukocytosis 04/03/2022   Hyperkalemia 04/03/2022   Acute kidney injury superimposed on chronic kidney disease ([place]) 04/03/2022   History of bladder cancer 04/03/2022   Esophageal dysmotility    UTI (urinary tract infection) 01/28/2021   Bladder cancer ([place]) 01/28/2021   Weakness 10/29/2020   Macular degeneration, wet ([place]) 06/18/2020   Pain due to onychomycosis of toenails of both feet 05/22/2020   Ventricular tachycardia ([place]) 04/23/2020   Chronic narcotic dependence ([place]) 12/14/2019   Neuropathy 12/14/2018   Stage 3b chronic kidney disease ([place]) 12/14/2018   Problems with swallowing and mastication    Achalasia    Right inguinal hernia 07/04/2018   Advance directive discussed with patient 04/28/2018   Chronic pain of both lower extremities 10/26/2017   Neuropathic pain 10/26/2017   Chronic pain syndrome 10/26/2017   Iron deficiency anemia 07/05/2017   Constipation 07/05/2017   CKD (chronic  kidney disease) stage 4, GFR 15-29 ml/min ([name]) 05/17/2017   Seropositive rheumatoid arthritis ([place]) 05/17/2017   Uncontrolled type 2 diabetes mellitus with hyperglycemia ([place]) 05/17/2017   Atherosclerotic heart disease of  native coronary artery with angina pectoris ([place]) 05/17/2017   Dysphagia 05/17/2017   High risk medication use 10/21/2016   Primary osteoarthritis of both knees 10/21/2016   History of right knee joint replacement 10/21/2016   Orthostatic hypotension 10/01/2016   Spinal stenosis of lumbar region without neurogenic claudication 03/23/2016   Lumbar facet arthropathy 03/23/2016   Respiratory failure, chronic ([place]) 11/21/2014   Rheumatoid arthritis ([place]) 11/05/2014   Rheumatic fever without heart involvement 03/04/2014   Ventral hernia 12/17/2013   ILD (interstitial lung disease) ([place]) 11/28/2011   Heart failure with mildly reduced ejection fraction (HFmrEF) ([place]) 09/14/2011   Orthostatic dizziness 04/27/2011   Nausea 02/16/2011   Diabetic polyneuropathy associated with type 2 diabetes mellitus ([place]) 08/10/2010   Type 2 diabetes mellitus with diabetic polyneuropathy ([place]) 08/10/2010   Obstructive sleep apnea 12/17/2008   ESOPHAGEAL STRICTURE 10/09/2008   Esophageal stricture 10/09/2008   Reflux esophagitis 09/10/2008   Diverticulosis of colon 09/10/2008   Gastro-esophageal reflux disease with esophagitis 09/10/2008   Coronary atherosclerosis 03/19/2008   Aortic stenosis 03/19/2008   Thoracic aorta atherosclerosis ([place]) 03/19/2008   Sleep disturbance 10/17/2006   Disturbance in sleep behavior 10/17/2006   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   Essential hypertension 09/23/2006   GERD 09/23/2006   BENIGN PROSTATIC HYPERTROPHY 09/23/2006   Enlarged prostate without lower urinary tract symptoms (luts) 09/23/2006   HLD (hyperlipidemia) 09/23/2006   PCP:  [name], MD Pharmacy:   [facility], [place], [place] [address], [place] [address] Phone: [phone] Fax: [place], [place] [address] Phone:  [phone] Fax: [phone]  [id number] [place]: [phone]: [phone]    Social Determinants of Health (SDOH) Social History: SDOH Screenings   Depression (PHQ2-9): Low Risk  (12/02/2020)  Financial Resource Strain: Low Risk  (07/25/2020)  Tobacco Use: Medium Risk (04/03/2022)   Readmission Risk Interventions    04/07/2022    4:44 PM  Readmission Risk Prevention Plan  Transportation Screening Complete  Medication Review (RN Care Manager) Referral to Pharmacy  HRI or [place]  SW Recovery Care/Counseling Consult Complete  Palliative Care Screening Complete  [place]

## 2022-04-07 NOTE — Progress Notes (Signed)
PT Cancellation Note  Patient Details Name: BENGIE KAUCHER MRN: [id number] DOB: December 22, 1931   Cancelled Treatment:    Reason Eval/Treat Not Completed: Other (comment) Pt declined PT today due to not feeling well.  Reports had a bad breathing episode and doesn't want to get it started again.  RN reports pt's sats remained stable and she gave him meds for anxiety.  Pt keeping eyes closed, increased resp rate, weak voice, rest breaks with talking.  Tried to encourage even just transfer to chair but pt declined.  Did note pt with palliative meeting scheduled for 1500 today.  Pt requesting therapy f/u tomorrow.  Abran Richard, PT Acute Rehab [facility] [phone]   Karlton Lemon 04/07/2022, 11:38 AM

## 2022-04-08 DIAGNOSIS — J9611 Chronic respiratory failure with hypoxia: Secondary | ICD-10-CM | POA: Diagnosis not present

## 2022-04-08 DIAGNOSIS — R7989 Other specified abnormal findings of blood chemistry: Secondary | ICD-10-CM | POA: Diagnosis not present

## 2022-04-08 DIAGNOSIS — I25119 Atherosclerotic heart disease of native coronary artery with unspecified angina pectoris: Secondary | ICD-10-CM | POA: Diagnosis not present

## 2022-04-08 DIAGNOSIS — J189 Pneumonia, unspecified organism: Secondary | ICD-10-CM | POA: Diagnosis not present

## 2022-04-08 LAB — BASIC METABOLIC PANEL
Anion gap: 13 (ref 5–15)
BUN: 82 mg/dL — ABNORMAL HIGH (ref 8–23)
CO2: 24 mmol/L (ref 22–32)
Calcium: 9.2 mg/dL (ref 8.9–10.3)
Chloride: 98 mmol/L (ref 98–111)
Creatinine, Ser: 2.76 mg/dL — ABNORMAL HIGH (ref 0.61–1.24)
GFR, Estimated: 21 mL/min — ABNORMAL LOW (ref >60)
Glucose, Bld: 205 mg/dL — ABNORMAL HIGH (ref 70–99)
Potassium: 4.1 mmol/L (ref 3.5–5.1)
Sodium: 135 mmol/L (ref 135–145)

## 2022-04-08 LAB — CULTURE, BLOOD (ROUTINE X 2)
Culture: NO GROWTH
Culture: NO GROWTH

## 2022-04-08 LAB — GLUCOSE, CAPILLARY
Glucose-Capillary: 200 mg/dL — ABNORMAL HIGH (ref 70–99)
Glucose-Capillary: 284 mg/dL — ABNORMAL HIGH (ref 70–99)
Glucose-Capillary: 248 mg/dL — ABNORMAL HIGH (ref 70–99)
Glucose-Capillary: 243 mg/dL — ABNORMAL HIGH (ref 70–99)

## 2022-04-08 MED ORDER — FUROSEMIDE 10 MG/ML IJ SOLN
40.0000 mg | Freq: Once | INTRAMUSCULAR | Status: AC
  Administered 2022-04-08: 40 mg via INTRAVENOUS
  Filled 2022-04-08: qty 4

## 2022-04-08 MED ORDER — METOPROLOL SUCCINATE ER 50 MG PO TB24
50.0000 mg | ORAL_TABLET | Freq: Every day | ORAL | Status: DC
  Administered 2022-04-08: 50 mg via ORAL
  Filled 2022-04-08: qty 1

## 2022-04-08 NOTE — Plan of Care (Signed)

## 2022-04-08 NOTE — Progress Notes (Signed)
OT Cancellation Note  Patient Details Name: MENG WINTERTON MRN: [id number] DOB: 1931-04-16   Cancelled Treatment:    Reason Eval/Treat Not Completed: Fatigue/lethargy limiting ability to participate pt reports fatigue declining session, will f/u as time allows.  Harley Alto., COTA/L Acute Rehabilitation Services [phone]   Precious Haws 04/08/2022, 4:13 PM

## 2022-04-08 NOTE — Progress Notes (Signed)
PROGRESS NOTE    [name]  AST:[id number] DOB: [birth year] DOA: 04/03/2022 PCP: [name], MD   Brief Narrative:    [name] is a 89 y.o. male with medical history significant of diastolic CHF, CAD, interstitial lung disease, nocturnal hypoxemia on 2 L of nasal cannula oxygen, bladder cancer, rheumatoid arthritis, and CKD stage IV presents with complaints of chest pain over the last 2 days.  He has been admitted for chest pain evaluation with concerns for unstable angina in the setting of elevated troponin with prior history of significant CAD with CABG x 5 and multiple PCI's.  He is also noted to have possible community-acquired pneumonia and has been started on Rocephin and azithromycin.  2D echocardiogram pending, appreciate cardiology consultation with plans to start Imdur today given chest pain. Palliative care consulted and plan to go home with hospice once medically stable.  Chest pain improved, breathing still an issue-- dosing lasix daily.    Assessment & Plan:   Principal Problem:   Elevated troponin Active Problems:   Atherosclerotic heart disease of native coronary artery with angina pectoris ([name])   Respiratory failure, chronic ([name])   CAP (community acquired pneumonia)   Heart failure with reduced ejection fraction ([name])   Leukocytosis   Hyperkalemia   Acute kidney injury superimposed on chronic kidney disease ([place])   Uncontrolled type 2 diabetes mellitus with hyperglycemia ([name])   ILD (interstitial lung disease) ([name])   GERD   History of bladder cancer  Assessment and Plan:  Elevated troponin CAD s/p CABG  Acute.   Patient presents with complaints of chest pain with exertion concerning for unstable angina.  High-sensitivity troponin elevated at 123.  Patient with prior history of CABG back in 2001 and multiple prior PCI's.  Last cardiac catheterization from 08/2015 noted patent LIMA- LAD, occluded SVG-RCA, SVG to diagonal with PCI/DES of SVG to OM  for severe stenosis of proximal body of graft for in-stent restenosis and recommended life long DAPT with ASA/plavix.  -Heparin drip stopped -Continue aspirin, Plavix, and  statin -Appreciate cardiology: Appreciate palliative care team's discussion, DNR.  Good hospice candidate. He is not a candidate for cardiac rehabilitation.  He cannot have ACE inhibitor or spironolactone secondary to kidney injury/chronic kidney disease.   Possible community-acquired pneumonia chronic respiratory failure with hypoxemia Patient requiring 2 L nasal cannula oxygen continuously but normally only on 2 L of nasal cannula oxygen at night.  Reports that he has had a cough.  Chest x-ray concerning for either  multifocal pneumonia and/or asymmetric pulmonary edema.  On differential also includes the possibility of aspiration given prior history of achalasia and dysphagia. -Continue nasal cannula oxygen maintain O2 saturation greater than 92% -Follow-up influenza, COVID-19, and RSV screening -Procalcitonin not significantly elevated -Continue Rocephin and azithromycin.  De-escalate to oral on discharge if not fully treated -x ray on 1/2 shows continued fluid so IV lasix given   Heart failure with reduced EF Suspect acute on chronic.  On physical exam patient does not appear grossly fluid overloaded.  Had been told to stop Lasix last week unless weight greater than 195.  BNP currently elevated at 447.8.  Last echocardiogram noted EF to be 40 to 45% with grade 1 diastolic dysfunction 98/9211.   -Strict I&Os and daily weights -IV lasix x 1 1/4 again   Leukocytosis Acute.  WBC elevated 10.6.  Question if this is reactive in nature or secondary to acute infection. -Procalcitonin is not significantly elevated, but continue antibiotics  for now and monitor    Acute kidney injury superimposed on chronic kidney disease stage IV On admission creatinine elevated up to 2.85 with BUN 45.  Creatinine previously noted to be 2.35  on 10/30. -Continues to have some creatinine elevation due to IV Lasix with adequate urine output noted; discontinue IV Lasix today -Follows with Dr. Royce Macadamia outpatient   Diabetes mellitus type 2 with hyperglycemia, without long-term use of insulin Glucose elevated at 304.  Last hemoglobin A1c 8.7 in 01/18/2022.  Home medications only include glipizide and is not on insulin. -Hypoglycemia protocol -Hold glipizide -CBGs before every meal with moderate SSI -NovoLog 0 - 5 units nightly -Adjust insulin regimen as needed   Bronchiectasis Patient followed by Dr. Estanislado Pandy of rheumatology and Dr. Chase Caller of pulmonology in the outpatient setting for bronchiectasis and mild interstitial lung disease in the setting [place] arthritis. -Continue outpatient follow-up  -aggressive pulm toilet   Essential hypertension -Currently with soft blood pressure readings, started on Imdur today per cardiology   History of bladder cancer -Continue outpatient follow-up with urology   History of seropositive rheumatoid arthritis   Hyperlipidemia -Continue Crestor   GERD -Continue Protonix    DVT prophylaxis:Heparin drip Code Status: Full Family Communication:called daughter 1/1 Disposition Plan: home with hospice Status is: Inpatient Remains inpatient appropriate because: home when medically stable   Consultants:  Cardiology Palliative care  Subjective: No chest pain overnight   Objective: Vitals:   04/07/22 2000 04/08/22 0400 04/08/22 0801 04/08/22 1146  BP:  120/80 (!) 135/109 114/63  Pulse:  90 (!) 107 100  Resp:  '18 18 16  '$ Temp:  97.7 F (36.5 C) 97.6 F (36.4 C) 97.6 F (36.4 C)  TempSrc:  Axillary Axillary Oral  SpO2: 97% 96% 96% 96%  Weight:  83.2 kg    Height:        Intake/Output Summary (Last 24 hours) at 04/08/2022 1326 Last data filed at 04/08/2022 1026 Gross per 24 hour  Intake 240 ml  Output 400 ml  Net -160 ml   Filed Weights   04/06/22 0448 04/07/22 0506  04/08/22 0400  Weight: 87.4 kg 85.6 kg 83.2 kg    Examination:    General: Appearance:    frail male in no acute distress     Lungs:     Clear to auscultation bilaterally, respirations unlabored  Heart:    Tachycardic.   MS:   All extremities are intact.   Neurologic:   Awake, alert- flat affect         Data Reviewed: I have personally reviewed following labs and imaging studies  CBC: Recent Labs  Lab 04/03/22 1221 04/04/22 0028 04/05/22 0141 04/06/22 0245 04/07/22 0148  WBC 10.6* 10.8* 9.7 9.1 10.7*  HGB 11.5* 11.7* 11.2* 11.5* 11.8*  HCT 35.3* 36.9* 32.9* 34.5* 35.1*  MCV 87.8 87.9 85.7 84.6 84.2  PLT 194 184 190 207 350   Basic Metabolic Panel: Recent Labs  Lab 04/04/22 0028 04/05/22 0141 04/06/22 0245 04/07/22 0148 04/08/22 0907  NA 134* 132* 132* 131* 135  K 4.8 4.3 4.3 4.0 4.1  CL 99 97* 96* 96* 98  CO2 '22 23 22 '$ 21* 24  GLUCOSE 163* 165* 164* 175* 205*  BUN 47* 58* 65* 72* 82*  CREATININE 2.91* 3.29* 3.00* 2.83* 2.76*  CALCIUM 9.4 9.0 9.2 9.3 9.2  MG  --  2.0  --   --   --    GFR: Estimated Creatinine Clearance: 20.3 mL/min (A) (by C-G formula based  on SCr of 2.76 mg/dL (H)). Liver Function Tests: No results for input(s): "AST", "ALT", "ALKPHOS", "BILITOT", "PROT", "ALBUMIN" in the last 168 hours. No results for input(s): "LIPASE", "AMYLASE" in the last 168 hours. No results for input(s): "AMMONIA" in the last 168 hours. Coagulation Profile: No results for input(s): "INR", "PROTIME" in the last 168 hours. Cardiac Enzymes: No results for input(s): "CKTOTAL", "CKMB", "CKMBINDEX", "TROPONINI" in the last 168 hours. BNP (last 3 results) No results for input(s): "PROBNP" in the last 8760 hours. HbA1C: No results for input(s): "HGBA1C" in the last 72 hours. CBG: Recent Labs  Lab 04/07/22 1255 04/07/22 1631 04/07/22 2142 04/08/22 0756 04/08/22 1145  GLUCAP 203* 252* 173* 200* 284*   Lipid Profile: No results for input(s): "CHOL", "HDL",  "LDLCALC", "TRIG", "CHOLHDL", "LDLDIRECT" in the last 72 hours. Thyroid Function Tests: No results for input(s): "TSH", "T4TOTAL", "FREET4", "T3FREE", "THYROIDAB" in the last 72 hours. Anemia Panel: No results for input(s): "VITAMINB12", "FOLATE", "FERRITIN", "TIBC", "IRON", "RETICCTPCT" in the last 72 hours. Sepsis Labs: Recent Labs  Lab 04/03/22 1441 04/03/22 1633  PROCALCITON  --  0.19  LATICACIDVEN 1.7 1.2    Recent Results (from the past 240 hour(s))  Culture, blood (routine x 2)     Status: None   Collection Time: 04/03/22  2:41 PM   Specimen: BLOOD RIGHT HAND  Result Value Ref Range Status   Specimen Description BLOOD RIGHT HAND  Final   Special Requests   Final    BOTTLES DRAWN AEROBIC AND ANAEROBIC Blood Culture results may not be optimal due to an inadequate volume of blood received in culture bottles   Culture   Final    NO GROWTH 5 DAYS Performed at [facility], [place] [address], [place], [place] [address]    Report Status 04/08/2022 FINAL  Final  Resp panel by RT-PCR (RSV, Flu A&B, Covid) Anterior Nasal Swab     Status: None   Collection Time: 04/03/22  3:00 PM   Specimen: Anterior Nasal Swab  Result Value Ref Range Status   SARS Coronavirus 2 by RT PCR NEGATIVE NEGATIVE Final    Comment: (NOTE) SARS-CoV-2 target nucleic acids are NOT DETECTED.  The SARS-CoV-2 RNA is generally detectable in upper respiratory specimens during the acute phase of infection. The lowest concentration of SARS-CoV-2 viral copies this assay can detect is 138 copies/mL. A negative result does not preclude SARS-Cov-2 infection and should not be used as the sole basis for treatment or other patient management decisions. A negative result may occur with  improper specimen collection/handling, submission of specimen other than nasopharyngeal swab, presence of viral mutation(s) within the areas targeted by this assay, and inadequate number of viral copies(<138 copies/mL). A negative  result must be combined with clinical observations, patient history, and epidemiological information. The expected result is Negative.  Fact Sheet for Patients:  EntrepreneurPulse.com.au  Fact Sheet for Healthcare Providers:  IncredibleEmployment.be  This test is no t yet approved or cleared by the [place] FDA and  has been authorized for detection and/or diagnosis of SARS-CoV-2 by FDA under an Emergency Use Authorization (EUA). This EUA will remain  in effect (meaning this test can be used) for the duration of the COVID-19 declaration under Section 564(b)(1) of the Act, 21 U.S.C.section 360bbb-3(b)(1), unless the authorization is terminated  or revoked sooner.       Influenza A by PCR NEGATIVE NEGATIVE Final   Influenza B by PCR NEGATIVE NEGATIVE Final    Comment: (NOTE) The Xpert  Xpress SARS-CoV-2/FLU/RSV plus assay is intended as an aid in the diagnosis of influenza from Nasopharyngeal swab specimens and should not be used as a sole basis for treatment. Nasal washings and aspirates are unacceptable for Xpert Xpress SARS-CoV-2/FLU/RSV testing.  Fact Sheet for Patients: EntrepreneurPulse.com.au  Fact Sheet for Healthcare Providers: IncredibleEmployment.be  This test is not yet approved or cleared by the [place] FDA and has been authorized for detection and/or diagnosis of SARS-CoV-2 by FDA under an Emergency Use Authorization (EUA). This EUA will remain in effect (meaning this test can be used) for the duration of the COVID-19 declaration under Section 564(b)(1) of the Act, 21 U.S.C. section 360bbb-3(b)(1), unless the authorization is terminated or revoked.     Resp Syncytial Virus by PCR NEGATIVE NEGATIVE Final    Comment: (NOTE) Fact Sheet for Patients: EntrepreneurPulse.com.au  Fact Sheet for Healthcare Providers: IncredibleEmployment.be  This  test is not yet approved or cleared by the [place] FDA and has been authorized for detection and/or diagnosis of SARS-CoV-2 by FDA under an Emergency Use Authorization (EUA). This EUA will remain in effect (meaning this test can be used) for the duration of the COVID-19 declaration under Section 564(b)(1) of the Act, 21 U.S.C. section 360bbb-3(b)(1), unless the authorization is terminated or revoked.  Performed at [facility], [place] [address] [place], [place] [address]   Culture, blood (routine x 2)     Status: None   Collection Time: 04/03/22  4:33 PM   Specimen: BLOOD  Result Value Ref Range Status   Specimen Description BLOOD RIGHT ANTECUBITAL  Final   Special Requests   Final    BOTTLES DRAWN AEROBIC ONLY Blood Culture results may not be optimal due to an inadequate volume of blood received in culture bottles   Culture   Final    NO GROWTH 5 DAYS Performed at [facility], [place] [address], [place], [place] [address]    Report Status 04/08/2022 FINAL  Final         Radiology Studies: No results found.      Scheduled Meds:  aspirin EC  81 mg Oral QHS   clopidogrel  75 mg Oral Daily   finasteride  5 mg Oral Daily   furosemide  40 mg Intravenous Once   guaiFENesin  600 mg Oral BID   insulin aspart  0-15 Units Subcutaneous TID WC   insulin aspart  0-5 Units Subcutaneous QHS   isosorbide mononitrate  60 mg Oral Daily   metoprolol succinate  50 mg Oral Daily   nortriptyline  10-20 mg Oral QHS   pantoprazole  40 mg Oral Daily   pregabalin  50 mg Oral QHS   sodium chloride flush  3 mL Intravenous Q12H   tamsulosin  0.4 mg Oral QHS   umeclidinium bromide  1 puff Inhalation Daily   Continuous Infusions:     LOS: 5 days    Time spent: 35 minutes    Geradine Girt, DO [facility]  If 7PM-7AM, please contact night-coverage [email or web address] 04/08/2022, 1:26 PM

## 2022-04-08 NOTE — Progress Notes (Signed)
Daily Progress Note   Patient Name: [name]       Date: 04/08/2022 DOB: 19-Jan-1932  Age: 89 y.o. MRN#: [id number] Attending Physician: Geradine Girt, DO Primary Care Physician: [name], MD Admit Date: 04/03/2022  Reason for Consultation/Follow-up: Establishing goals of care  Patient Profile/HPI:  89 y.o. male  with past medical history of RA with lung involvement, CKD, CHF, DM, CAD s/p CABG x5, macular degeneration admitted on 04/03/2022 with shortness of breath and chest pain. Workup reveals pneumonia, NSTEMI, worsening heart failure with TEE indicating CHF at 25-30% which is worse from October EF 40-45%. Acute on chronic kidney injury- Cr 2.83, trending down from max 3.29 after receiving lasix. Per Cardiology no good options for interventions. Palliative medicine consulted for [place].     Subjective: [name] was sleeping on my exam. Respiratory effort normal on oxygen. He did not arouse to my voice. Appeared comfortable.  Spoke with [name]- [facility] has been in contact with her.      Physical Exam Vitals and nursing note reviewed.  Constitutional:      Appearance: He is ill-appearing.  Pulmonary:     Effort: Pulmonary effort is normal.  Neurological:     Comments: Sleeping             Vital Signs: BP 114/63 (BP Location: Left Arm)   Pulse 100   Temp 97.6 F (36.4 C) (Oral)   Resp 16   Ht 6' (1.829 m)   Wt 83.2 kg   SpO2 96%   BMI 24.88 kg/m  SpO2: SpO2: 96 % O2 Device: O2 Device: Nasal Cannula O2 Flow Rate: O2 Flow Rate (L/min): 5 L/min  Intake/output summary:  Intake/Output Summary (Last 24 hours) at 04/08/2022 1401 Last data filed at 04/08/2022 1026 Gross per 24 hour  Intake 240 ml  Output 400 ml  Net -160 ml   LBM: Last BM Date :  04/07/22 Baseline Weight: Weight: 88.5 kg Most recent weight: Weight: 83.2 kg       Palliative Assessment/Data: PPS: 50%      Patient Active Problem List   Diagnosis Date Noted   Elevated troponin 04/03/2022   CAP (community acquired pneumonia) 04/03/2022   Heart failure with reduced ejection fraction ([place]) 04/03/2022   Leukocytosis 04/03/2022   Hyperkalemia 04/03/2022   Acute kidney  injury superimposed on chronic kidney disease ([place]) 04/03/2022   History of bladder cancer 04/03/2022   Esophageal dysmotility    UTI (urinary tract infection) 01/28/2021   Bladder cancer ([place]) 01/28/2021   Weakness 10/29/2020   Macular degeneration, wet ([place]) 06/18/2020   Pain due to onychomycosis of toenails of both feet 05/22/2020   Ventricular tachycardia ([place]) 04/23/2020   Chronic narcotic dependence ([place]) 12/14/2019   Neuropathy 12/14/2018   Stage 3b chronic kidney disease ([place]) 12/14/2018   Problems with swallowing and mastication    Achalasia    Right inguinal hernia 07/04/2018   Advance directive discussed with patient 04/28/2018   Chronic pain of both lower extremities 10/26/2017   Neuropathic pain 10/26/2017   Chronic pain syndrome 10/26/2017   Iron deficiency anemia 07/05/2017   Constipation 07/05/2017   CKD (chronic kidney disease) stage 4, GFR 15-29 ml/min ([name]) 05/17/2017   Seropositive rheumatoid arthritis ([place]) 05/17/2017   Uncontrolled type 2 diabetes mellitus with hyperglycemia ([place]) 05/17/2017   Atherosclerotic heart disease of native coronary artery with angina pectoris ([place]) 05/17/2017   Dysphagia 05/17/2017   High risk medication use 10/21/2016   Primary osteoarthritis of both knees 10/21/2016   History of right knee joint replacement 10/21/2016   Orthostatic hypotension 10/01/2016   Spinal stenosis of lumbar region without neurogenic claudication 03/23/2016   Lumbar facet arthropathy 03/23/2016   Respiratory failure, chronic ([place]) 11/21/2014   Rheumatoid  arthritis ([place]) 11/05/2014   Rheumatic fever without heart involvement 03/04/2014   Ventral hernia 12/17/2013   ILD (interstitial lung disease) ([place]) 11/28/2011   Heart failure with mildly reduced ejection fraction (HFmrEF) ([place]) 09/14/2011   Orthostatic dizziness 04/27/2011   Nausea 02/16/2011   Diabetic polyneuropathy associated with type 2 diabetes mellitus ([place]) 08/10/2010   Type 2 diabetes mellitus with diabetic polyneuropathy ([place]) 08/10/2010   Obstructive sleep apnea 12/17/2008   ESOPHAGEAL STRICTURE 10/09/2008   Esophageal stricture 10/09/2008   Reflux esophagitis 09/10/2008   Diverticulosis of colon 09/10/2008   Gastro-esophageal reflux disease with esophagitis 09/10/2008   Coronary atherosclerosis 03/19/2008   Aortic stenosis 03/19/2008   Thoracic aorta atherosclerosis ([place]) 03/19/2008   Sleep disturbance 10/17/2006   Disturbance in sleep behavior 10/17/2006   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   Essential hypertension 09/23/2006   GERD 09/23/2006   BENIGN PROSTATIC HYPERTROPHY 09/23/2006   Enlarged prostate without lower urinary tract symptoms (luts) 09/23/2006   HLD (hyperlipidemia) 09/23/2006    Palliative Care Assessment & Plan    Assessment/Recommendations/Plan  Continue current plan- limited interventions with goal of maximizing this hospitalization and then discharge home with hospice Continue liquid oxycodone for shortness of breath, lorazepam for anxiety   Code Status: DNR  Prognosis:  < 3 months  Discharge Planning: Home with Hospice  Care plan was discussed with patient's daughter and care team.   Thank you for allowing the Palliative Medicine Team to assist in the care of this patient.  Greater than 50%  of this time was spent counseling and coordinating care related to the above assessment and plan.  Mariana Kaufman, AGNP-C Palliative Medicine   Please contact Palliative Medicine Team phone at [phone] for questions  and concerns.

## 2022-04-08 NOTE — Progress Notes (Signed)
Pt asleep in bed Physical Therapy Treatment Patient Details Name: [name] MRN: [id number] DOB: Feb 09, 1932 Today's Date: 04/08/2022   History of Present Illness Patient is a 89 y/o male who presents on 12/30 with chest pain. Found to have NSTEMI. CXR- multifocal PNA vs asymmetric edema with pleural effusion on right. PMH includes bladder ca, HTN, OSA, CAD, CABG, chronic pain, CKD, DM, RA, CHF, TKR.    PT Comments    Pt asleep in bed on entry, easily wakes and reluctantly agrees to get up to recliner for breakfast. Encouraged pt get up so that he will maintain his strength. Pt is modA for bed mobility and stand step transfer to recliner.  Pt refuses further mobility due to wanting to eat breakfast. D/c plans remain appropriate. Will have Mobility specialist continue ambulation with pt.   Recommendations for follow up therapy are one component of a multi-disciplinary discharge planning process, led by the attending physician.  Recommendations may be updated based on patient status, additional functional criteria and insurance authorization.  Follow Up Recommendations  Home health PT (with Hospice care)     Assistance Recommended at Discharge Intermittent Supervision/Assistance  Patient can return home with the following A little help with walking and/or transfers;A little help with bathing/dressing/bathroom;Assist for transportation;Assistance with cooking/housework;Help with stairs or ramp for entrance   Equipment Recommendations  None recommended by PT       Precautions / Restrictions Precautions Precautions: Fall;Other (comment) Precaution Comments: watch 02 Restrictions Weight Bearing Restrictions: No     Mobility  Bed Mobility Overal bed mobility: Needs Assistance Bed Mobility: Supine to Sit     Supine to sit: Mod assist     General bed mobility comments: pt able to initiate movements of LE to EoB, reaching for bed rail and bringing trunk to upright but ultimately  needs modA for pad scoot of LE out of bed, rotation of shoulder and bringing trunk to upright    Transfers Overall transfer level: Needs assistance Equipment used: 1 person hand held assist Transfers: Sit to/from Stand, Bed to chair/wheelchair/BSC Sit to Stand: Mod assist   Step pivot transfers: Mod assist, From elevated surface       General transfer comment: pt refuses RW use to stand, requires elevated surface and modA for power up and steady before taking steps to recliner with modA for steadying,    Ambulation/Gait               General Gait Details: refuses as would like to eat breakfast         Balance Overall balance assessment: Needs assistance Sitting-balance support: Feet supported, No upper extremity supported Sitting balance-Leahy Scale: Good     Standing balance support: During functional activity, Bilateral upper extremity supported Standing balance-Leahy Scale: Poor Standing balance comment: Requires UE support for dynamic tasks/walking, able to stand statically without UE support.                            Cognition Arousal/Alertness: Awake/alert Behavior During Therapy: Flat affect Overall Cognitive Status: Difficult to assess                                 General Comments: requires increased encouragement but once moving is agreeable           General Comments General comments (skin integrity, edema, etc.): VSS on 4L O2 via [place]  Pertinent Vitals/Pain Pain Assessment Pain Assessment: Faces Faces Pain Scale: Hurts little more Pain Location: generalized pain with movement Pain Descriptors / Indicators: Grimacing, Guarding Pain Intervention(s): Limited activity within patient's tolerance, Monitored during session, Repositioned     PT Goals (current goals can now be found in the care plan section) Acute Rehab PT Goals Patient Stated Goal: to go home today PT Goal Formulation: With patient Time For Goal  Achievement: 04/19/22 Potential to Achieve Goals: Good Progress towards PT goals: Progressing toward goals    Frequency    Min 3X/week      PT Plan Current plan remains appropriate       AM-PAC PT "6 Clicks" Mobility   Outcome Measure  Help needed turning from your back to your side while in a flat bed without using bedrails?: A Lot Help needed moving from lying on your back to sitting on the side of a flat bed without using bedrails?: A Lot Help needed moving to and from a bed to a chair (including a wheelchair)?: A Lot Help needed standing up from a chair using your arms (e.g., wheelchair or bedside chair)?: A Lot Help needed to walk in hospital room?: A Little Help needed climbing 3-5 steps with a railing? : A Lot 6 Click Score: 13    End of Session Equipment Utilized During Treatment: Oxygen;Gait belt Activity Tolerance: Patient tolerated treatment well Patient left: with call bell/phone within reach;with chair alarm set;in chair Nurse Communication: Mobility status PT Visit Diagnosis: Muscle weakness (generalized) (M62.81);Difficulty in walking, not elsewhere classified (R26.2);Unsteadiness on feet (R26.81)     Time: 7793-9030 PT Time Calculation (min) (ACUTE ONLY): 24 min  Charges:  $Therapeutic Activity: 23-37 mins                     Rockford Leinen B. Migdalia Dk PT, DPT Acute Rehabilitation Services Please use secure chat or  Call Office [phone]    [place] 04/08/2022, 9:59 AM

## 2022-04-08 NOTE — Progress Notes (Signed)
Manufacturing engineer [facility]) Hospital Liaison Note  Referral received for patient/family interest in hospice at home. [place] liaison spoke with patient's daughter [name] to confirm interest. Interest confirmed.   Hospice eligibility pending.   Plan is to discharge within the next 1-2 days.   No DME needs at this time.   Please send comfort medications/prescriptions home with patient at discharge.   Please call with any questions or concerns. Thank you  Roselee Nova, [place] Hospital Liaison [phone]

## 2022-04-08 NOTE — TOC Progression Note (Signed)
Transition of Care [facility]) - Progression Note    Patient Details  Name: [name] MRN: [id number] Date of Birth: 02-20-1932  Transition [facility]) CM/SW Contact  Graves-Bigelow, Ocie Cornfield, RN Phone Number: 04/08/2022, 4:20 PM  Clinical Narrative:  Case Manager spoke with daughter [name], patient, patients spouse, and granddaughter regarding disposition needs. Family would like for [facility] to order a hospital bed, oxygen, and overbed table to be ordered. Patient currently has oxygen in the home via [facility]. Daughter [name] is the contact person for DME delivery. [name] had questions regarding how soon a RN would be out to the home. Case Manager explained unsure of a visit time; however, [place] will be in contact. Case Manager did make Nicholaus Corolla aware of the above information. Family is aware that the patient will be evaluated for Hospice services once he arrives home. Family wants the patient to transport home via ambulance. Case Manager will continue to follow for additional transition of care needs.   Expected Discharge Plan: Home w Hospice Care Barriers to Discharge: Continued Medical Work up  Expected Discharge Plan and Services In-house Referral: NA Discharge Planning Services: CM Consult Post Acute Care Choice: Hospice Living arrangements for the past 2 months: Single Family Home                   DME Agency: NA   Social Determinants of Health (SDOH) Interventions SDOH Screenings   Depression (PHQ2-9): Low Risk  (12/02/2020)  Financial Resource Strain: Low Risk  (07/25/2020)  Tobacco Use: Medium Risk (04/03/2022)    Readmission Risk Interventions    04/07/2022    4:44 PM  Readmission Risk Prevention Plan  Transportation Screening Complete  Medication Review (RN Care Manager) Referral to Pharmacy  HRI or [place] Complete  SW Recovery Care/Counseling Consult Complete  Palliative Care Screening Complete  [place] Not  Applicable

## 2022-04-08 NOTE — Progress Notes (Addendum)
Rounding Note    Patient Name: [name] Date of Encounter: 04/08/2022  [place] Cardiologist: [name], MD   Subjective   Pt is doing well this morning without significant return of his intermittent angina. He has no new or worsening complaints.   Inpatient Medications    Scheduled Meds:  aspirin EC  81 mg Oral QHS   clopidogrel  75 mg Oral Daily   finasteride  5 mg Oral Daily   guaiFENesin  600 mg Oral BID   insulin aspart  0-15 Units Subcutaneous TID WC   insulin aspart  0-5 Units Subcutaneous QHS   isosorbide mononitrate  60 mg Oral Daily   metoprolol succinate  50 mg Oral Daily   nortriptyline  10-20 mg Oral QHS   pantoprazole  40 mg Oral Daily   pregabalin  50 mg Oral QHS   sodium chloride flush  3 mL Intravenous Q12H   tamsulosin  0.4 mg Oral QHS   umeclidinium bromide  1 puff Inhalation Daily   Continuous Infusions:  PRN Meds: acetaminophen **OR** acetaminophen, albuterol, hydrALAZINE, HYDROcodone-acetaminophen, LORazepam, nitroGLYCERIN, oxyCODONE, sodium chloride   Vital Signs    Vitals:   04/07/22 1935 04/07/22 2000 04/08/22 0400 04/08/22 0801  BP: 112/81  120/80 (!) 135/109  Pulse: 99  90 (!) 107  Resp: '20  18 18  '$ Temp: 97.7 F (36.5 C)  97.7 F (36.5 C) 97.6 F (36.4 C)  TempSrc: Axillary  Axillary Axillary  SpO2: 97% 97% 96% 96%  Weight:   83.2 kg   Height:        Intake/Output Summary (Last 24 hours) at 04/08/2022 1035 Last data filed at 04/08/2022 1026 Gross per 24 hour  Intake 240 ml  Output 1150 ml  Net -910 ml      04/08/2022    4:00 AM 04/07/2022    5:06 AM 04/06/2022    4:48 AM  Last 3 Weights  Weight (lbs) 183 lb 6.8 oz 188 lb 11.4 oz 192 lb 10.9 oz  Weight (kg) 83.2 kg 85.6 kg 87.4 kg      Telemetry    Sinus tachycardia with rates predominately 100-110, occasional PVCs - Personally Reviewed  ECG    ST depression in V3 through V6  - Personally Reviewed  Physical Exam   GEN: No acute distress.    Cardiac: tachycardic with regular rhythm, no murmurs, rubs, or gallops.  Respiratory: Clear to auscultation bilaterally. GI: Soft, nontender, non-distended  MS: No edema; No deformity. Neuro:  Nonfocal  Psych: Normal affect   Labs    High Sensitivity Troponin:   Recent Labs  Lab 04/03/22 1221 04/03/22 1441 04/04/22 0821 04/04/22 1014  TROPONINIHS 123* 109* 111* 100*     Chemistry Recent Labs  Lab 04/05/22 0141 04/06/22 0245 04/07/22 0148 04/08/22 0907  NA 132* 132* 131* 135  K 4.3 4.3 4.0 4.1  CL 97* 96* 96* 98  CO2 23 22 21* 24  GLUCOSE 165* 164* 175* 205*  BUN 58* 65* 72* 82*  CREATININE 3.29* 3.00* 2.83* 2.76*  CALCIUM 9.0 9.2 9.3 9.2  MG 2.0  --   --   --   GFRNONAA 17* 19* 21* 21*  ANIONGAP '12 14 14 13    '$ Lipids No results for input(s): "CHOL", "TRIG", "HDL", "LABVLDL", "LDLCALC", "CHOLHDL" in the last 168 hours.  Hematology Recent Labs  Lab 04/05/22 0141 04/06/22 0245 04/07/22 0148  WBC 9.7 9.1 10.7*  RBC 3.84* 4.08* 4.17*  HGB 11.2* 11.5*  11.8*  HCT 32.9* 34.5* 35.1*  MCV 85.7 84.6 84.2  MCH 29.2 28.2 28.3  MCHC 34.0 33.3 33.6  RDW 14.1 14.0 13.9  PLT 190 207 237   Thyroid No results for input(s): "TSH", "FREET4" in the last 168 hours.  BNP Recent Labs  Lab 04/03/22 1221  BNP 447.8*    DDimer No results for input(s): "DDIMER" in the last 168 hours.   Radiology    DG CHEST PORT 1 VIEW  Result Date: 04/06/2022 CLINICAL DATA:  Dyspnea. EXAM: PORTABLE CHEST 1 VIEW COMPARISON:  04/03/2022 FINDINGS: Stable mild cardiac enlargement. Previous median sternotomy and CABG procedure. Moderate increased interstitial markings are identified throughout both lungs. Blunting of the costophrenic angles. Decreased aeration to the left base may represent atelectasis or airspace disease. IMPRESSION: 1. Moderate increased interstitial markings throughout both lungs compatible with CHF. 2. Decreased aeration to the left base may represent atelectasis or airspace  disease. Electronically Signed   By: Kerby Moors M.D.   On: 04/06/2022 13:13    Cardiac Studies   Cardiac Catheterization 08/13/15 LM ost 80 LAD prox 100 OM2 90 RCA prox 100 L-LAD ok S-OM1/OM2 prox 99 ISR S-RCA 100 (CTO) S-D1 prox 100 (CTO) PCI:  3.5 x 12 mm Resolute DES to S-OM1/OM2    Diagnostic Dominance: Right  Intervention      04/04/22 TTE   IMPRESSIONS     1. No LV thrombus seen with Definity contrast. Left ventricular ejection  fraction, by estimation, is 25 to 30%. The left ventricle has severely  decreased function. The left ventricle demonstrates regional wall motion  abnormalities (see scoring  diagram/findings for description). The left ventricular internal cavity  size was mildly dilated. There is mild concentric left ventricular  hypertrophy. Left ventricular diastolic parameters are consistent with  Grade II diastolic dysfunction  (pseudonormalization). Elevated left atrial pressure. There is severe  hypokinesis of the left ventricular, entire inferolateral wall. There is  severe hypokinesis of the left ventricular, entire apical segment. There  is mild hypokinesis of the left  ventricular, basal-mid anteroseptal wall, anterior wall and inferior wall.   2. Right ventricular systolic function is normal. The right ventricular  size is normal. Tricuspid regurgitation signal is inadequate for assessing  PA pressure.   3. Left atrial size was mildly dilated.   4. The mitral valve is normal in structure. Mild to moderate mitral valve  regurgitation. Moderate mitral annular calcification.   5. Low flow low gradient aortic stenosis. The aortic valve was not well  visualized. There is moderate calcification of the aortic valve. There is  moderate thickening of the aortic valve. Aortic valve regurgitation is not  visualized. Moderate to severe  aortic valve stenosis.   6. The inferior vena cava is normal in size with greater than 50%  respiratory  variability, suggesting right atrial pressure of 3 mmHg.   Comparison(s): The left ventricular function is significantly worse. The  left ventricular wall motion abnormalities are new.   FINDINGS   Left Ventricle: No LV thrombus seen with Definity contrast. Left  ventricular ejection fraction, by estimation, is 25 to 30%. The left  ventricle has severely decreased function. The left ventricle demonstrates  regional wall motion abnormalities. Severe  hypokinesis of the left ventricular, entire inferolateral wall. Severe  hypokinesis of the left ventricular, entire apical segment. Mild  hypokinesis of the left ventricular, basal-mid anteroseptal wall, anterior  wall and inferior wall. Definity contrast  agent was given IV to delineate the left ventricular endocardial  borders.  The left ventricular internal cavity size was mildly dilated. There is  mild concentric left ventricular hypertrophy. Left ventricular diastolic  parameters are consistent with Grade   II diastolic dysfunction (pseudonormalization). Elevated left atrial  pressure.   Right Ventricle: The right ventricular size is normal. No increase in  right ventricular wall thickness. Right ventricular systolic function is  normal. Tricuspid regurgitation signal is inadequate for assessing PA  pressure.   Left Atrium: Left atrial size was mildly dilated.   Right Atrium: Right atrial size was normal in size.   Pericardium: There is no evidence of pericardial effusion.   Mitral Valve: The mitral valve is normal in structure. There is mild  thickening of the mitral valve leaflet(s). Moderate mitral annular  calcification. Mild to moderate mitral valve regurgitation, with  centrally-directed jet.   Tricuspid Valve: The tricuspid valve is normal in structure. Tricuspid  valve regurgitation is not demonstrated.   Aortic Valve: Low flow low gradient aortic stenosis. The aortic valve was  not well visualized. There is moderate  calcification of the aortic valve.  There is moderate thickening of the aortic valve. Aortic valve  regurgitation is not visualized. Moderate  to severe aortic stenosis is present. Aortic valve mean gradient measures  12.0 mmHg. Aortic valve peak gradient measures 18.0 mmHg. Aortic valve  area, by VTI measures 1.03 cm.   Pulmonic Valve: The pulmonic valve was normal in structure. Pulmonic valve  regurgitation is not visualized.   Aorta: The aortic root and ascending aorta are structurally normal, with  no evidence of dilitation.   Venous: The inferior vena cava is normal in size with greater than 50%  respiratory variability, suggesting right atrial pressure of 3 mmHg.   IAS/Shunts: No atrial level shunt detected by color flow Doppler.   Patient Profile     89 y.o. male with chronic kidney disease creatinine approximately 2 at baseline with known coronary artery disease post bypass in 2001 with most recent stent placement to SVG OM.  Has occluded SVG to RCA and SVG to D1.  Here with non-ST elevation myocardial infarction.    Assessment & Plan    NSTEMI CAD s/p CABG   Hx of CAD status post CABG x5. Last cardiac catheterization May 2017 with patent LIMA-LAD, occluded SVG-RCA, SVG to diagonal with PCI/DES of SVG to OM for severe stenosis of proximal body of graft for in stent restenosis. Admitted with increasing weakness and epigastric pain that was responsive to nitro. Troponin 123 -> 111, up from baseline of 40-70. New ST depression in precordial leads V3 to V6.    Continue medical management given CKD stage IV. Suspect re-stenosis of SVG to OM graft. Unlikely to have PCI targets. Imdur 60 mg daily . Plavix 75 mg.   Acute on chronic HFrEF (worsening EF) Ischemic cardiomyopathy   TTE this admission shows LVEF 25-30%, down from 40-45% this past October. Has received some lasix with subsequent creatinine bump.    Increase Toprol XL 50 mg QD for persistent tachycardia  Continue  Imdur 60 mg QD for afterload reduction May need intermittent lasix to maintain euvolemia.  Unable to add additional GDMT 2/2 CKD and borderline BP Palliative care involved, will optimize care this admission and plan for hospice outpatient, now DNR   Aortic Stenosis, moderate to severe (low flow, low gradient)   Mean gradient 12.54mHg. Peak gradient 168mg. VTI 1.03 cm2. Patient would not be a TAVR candidate.   CKD stage IV   Patient followed by  Dr. Royce Macadamia with nephrology. Creatinine improved to 2.76 today.   Per primary team:   Chronic pain     For questions or updates, please contact [place] Please consult [email or web address] for contact info under        Signed, Johny Blamer, DO  04/08/2022, 10:35 AM    Internal Medicine Resident, PGY-1 Pager# [phone]  Personally seen and examined. Agree with above.  Sitting up in chair, ate all of his breakfast.  Feels better this morning.  No significant crackles on exam.  Mildly elevated troponin-medically managed non-ST elevation myocardial infarction.  Plavix, aspirin, beta-blocker (we increased his Toprol to 50 mg daily), isosorbide.  He is not a candidate for cardiac catheterization.  Prior catheterization reviewed.  Likely has all 3 SVG grafts down and LIMA to LAD open.  There are no good targets.  Appreciate palliative care team's discussion, DNR.  Good hospice candidate. He is not a candidate for cardiac rehabilitation.  He cannot have ACE inhibitor or spironolactone secondary to kidney injury/chronic kidney disease.  Candee Furbish, MD

## 2022-04-09 DIAGNOSIS — I5020 Unspecified systolic (congestive) heart failure: Secondary | ICD-10-CM | POA: Diagnosis not present

## 2022-04-09 DIAGNOSIS — R7989 Other specified abnormal findings of blood chemistry: Secondary | ICD-10-CM | POA: Diagnosis not present

## 2022-04-09 LAB — BASIC METABOLIC PANEL
Anion gap: 11 (ref 5–15)
BUN: 91 mg/dL — ABNORMAL HIGH (ref 8–23)
CO2: 25 mmol/L (ref 22–32)
Calcium: 9.5 mg/dL (ref 8.9–10.3)
Chloride: 98 mmol/L (ref 98–111)
Creatinine, Ser: 2.65 mg/dL — ABNORMAL HIGH (ref 0.61–1.24)
GFR, Estimated: 22 mL/min — ABNORMAL LOW (ref >60)
Glucose, Bld: 262 mg/dL — ABNORMAL HIGH (ref 70–99)
Potassium: 4.2 mmol/L (ref 3.5–5.1)
Sodium: 134 mmol/L — ABNORMAL LOW (ref 135–145)

## 2022-04-09 LAB — GLUCOSE, CAPILLARY
Glucose-Capillary: 307 mg/dL — ABNORMAL HIGH (ref 70–99)
Glucose-Capillary: 195 mg/dL — ABNORMAL HIGH (ref 70–99)
Glucose-Capillary: 262 mg/dL — ABNORMAL HIGH (ref 70–99)
Glucose-Capillary: 288 mg/dL — ABNORMAL HIGH (ref 70–99)

## 2022-04-09 MED ORDER — FUROSEMIDE 40 MG PO TABS
40.0000 mg | ORAL_TABLET | Freq: Every day | ORAL | Status: DC
  Administered 2022-04-10 – 2022-04-12 (×3): 40 mg via ORAL
  Filled 2022-04-09 (×3): qty 1

## 2022-04-09 MED ORDER — FUROSEMIDE 10 MG/ML IJ SOLN
40.0000 mg | Freq: Once | INTRAMUSCULAR | Status: AC
  Administered 2022-04-09: 40 mg via INTRAVENOUS
  Filled 2022-04-09: qty 4

## 2022-04-09 MED ORDER — INSULIN GLARGINE-YFGN 100 UNIT/ML ~~LOC~~ SOLN
10.0000 [IU] | Freq: Every day | SUBCUTANEOUS | Status: DC
  Administered 2022-04-09 – 2022-04-10 (×2): 10 [IU] via SUBCUTANEOUS
  Filled 2022-04-09 (×3): qty 0.1

## 2022-04-09 MED ORDER — METOPROLOL SUCCINATE ER 100 MG PO TB24
100.0000 mg | ORAL_TABLET | Freq: Every day | ORAL | Status: DC
  Administered 2022-04-09 – 2022-04-12 (×4): 100 mg via ORAL
  Filled 2022-04-09 (×4): qty 1

## 2022-04-09 NOTE — TOC Progression Note (Addendum)
Transition of Care [facility]) - Progression Note    Patient Details  Name: [name] MRN: [id number] Date of Birth: 16-Dec-1931  Transition of Care [facility]) CM/SW Contact  Graves-Bigelow, Ocie Cornfield, RN Phone Number: 04/09/2022, 12:50 PM  Clinical Narrative: Case Manager was trying to arrange DME delivery for hospital bed and overbed table for today. [facility] uses [facility] and the office had confirmed with daughter [name] for delivery for Monday. Daughter misunderstood that the HB would not be delivered until Monday. Barrister's clerk is working with [employer] to see if the DME HB, overbed table, and oxygen can be delivered on 04-10-22 to the home instead of Monday. Daughter feels that the patient will be more comfortable in the hospital bed instead of his recliner. Patient will transition home via ambulance once the DME arrives. Weekend Case Manager to follow.    1351 04-09-22 Case Manager received an updated notice from [facility] regarding DME- HB and Overbed table will be delivered to the home on Saturday.  Expected Discharge Plan: Home w Hospice Care Barriers to Discharge: Continued Medical Work up  Expected Discharge Plan and Services In-house Referral: NA Discharge Planning Services: CM Consult Post Acute Care Choice: Hospice Living arrangements for the past 2 months: Single Family Home                   DME Agency: NA    Social Determinants of Health (SDOH) Interventions SDOH Screenings   Depression (PHQ2-9): Low Risk  (12/02/2020)  Financial Resource Strain: Low Risk  (07/25/2020)  Tobacco Use: Medium Risk (04/03/2022)    Readmission Risk Interventions    04/07/2022    4:44 PM  Readmission Risk Prevention Plan  Transportation Screening Complete  Medication Review (RN Care Manager) Referral to Pharmacy  HRI or [place] Complete  SW Recovery Care/Counseling Consult Complete  Palliative Care Screening Complete  [place] Not Applicable

## 2022-04-09 NOTE — Progress Notes (Addendum)
Rounding Note    Patient Name: [name] Date of Encounter: 04/09/2022  [place] Cardiologist: [name], MD   Subjective   Pt is doing ok this morning without any return of his angina yesterday or overnight. He is having some mild dyspnea this morning. Overall he states he is doing alright.   Inpatient Medications    Scheduled Meds:  aspirin EC  81 mg Oral QHS   clopidogrel  75 mg Oral Daily   finasteride  5 mg Oral Daily   guaiFENesin  600 mg Oral BID   insulin aspart  0-15 Units Subcutaneous TID WC   insulin aspart  0-5 Units Subcutaneous QHS   isosorbide mononitrate  60 mg Oral Daily   metoprolol succinate  50 mg Oral Daily   nortriptyline  10-20 mg Oral QHS   pantoprazole  40 mg Oral Daily   pregabalin  50 mg Oral QHS   sodium chloride flush  3 mL Intravenous Q12H   tamsulosin  0.4 mg Oral QHS   umeclidinium bromide  1 puff Inhalation Daily   Continuous Infusions:  PRN Meds: acetaminophen **OR** acetaminophen, albuterol, hydrALAZINE, HYDROcodone-acetaminophen, LORazepam, nitroGLYCERIN, oxyCODONE, sodium chloride   Vital Signs    Vitals:   04/08/22 2124 04/09/22 0000 04/09/22 0548 04/09/22 0743  BP: 112/73 120/77 133/79   Pulse: (!) 102 (!) 102 (!) 109   Resp: '17 19 16   '$ Temp: 98 F (36.7 C) (!) 97.4 F (36.3 C) 97.8 F (36.6 C)   TempSrc: Oral Oral Oral   SpO2: 96% 95% 97% 96%  Weight:   86.1 kg   Height:        Intake/Output Summary (Last 24 hours) at 04/09/2022 0828 Last data filed at 04/09/2022 0556 Gross per 24 hour  Intake 240 ml  Output 2450 ml  Net -2210 ml      04/09/2022    5:48 AM 04/08/2022    4:00 AM 04/07/2022    5:06 AM  Last 3 Weights  Weight (lbs) 189 lb 13.1 oz 183 lb 6.8 oz 188 lb 11.4 oz  Weight (kg) 86.1 kg 83.2 kg 85.6 kg      Telemetry    Sinus tachycardia with rates predominately 100-110, occasional PVCs  - Personally Reviewed  ECG    ST depression in V3 through V6 (04/06/2022) - Personally  Reviewed  Physical Exam   GEN: No acute distress.  Neck: No JVD  Cardiac: tachycardic with regular rhythm, no murmurs, rubs, or gallops.  Respiratory: Clear to auscultation bilaterally. GI: Soft, nontender, non-distended  MS: No edema; No deformity. Neuro:  Nonfocal  Psych: Normal affect   Labs    High Sensitivity Troponin:   Recent Labs  Lab 04/03/22 1221 04/03/22 1441 04/04/22 0821 04/04/22 1014  TROPONINIHS 123* 109* 111* 100*     Chemistry Recent Labs  Lab 04/05/22 0141 04/06/22 0245 04/07/22 0148 04/08/22 0907 04/09/22 0545  NA 132*   < > 131* 135 134*  K 4.3   < > 4.0 4.1 4.2  CL 97*   < > 96* 98 98  CO2 23   < > 21* 24 25  GLUCOSE 165*   < > 175* 205* 262*  BUN 58*   < > 72* 82* 91*  CREATININE 3.29*   < > 2.83* 2.76* 2.65*  CALCIUM 9.0   < > 9.3 9.2 9.5  MG 2.0  --   --   --   --   GFRNONAA 17*   < >  21* 21* 22*  ANIONGAP 12   < > '14 13 11   '$ < > = values in this interval not displayed.    Lipids No results for input(s): "CHOL", "TRIG", "HDL", "LABVLDL", "LDLCALC", "CHOLHDL" in the last 168 hours.  Hematology Recent Labs  Lab 04/05/22 0141 04/06/22 0245 04/07/22 0148  WBC 9.7 9.1 10.7*  RBC 3.84* 4.08* 4.17*  HGB 11.2* 11.5* 11.8*  HCT 32.9* 34.5* 35.1*  MCV 85.7 84.6 84.2  MCH 29.2 28.2 28.3  MCHC 34.0 33.3 33.6  RDW 14.1 14.0 13.9  PLT 190 207 237   Thyroid No results for input(s): "TSH", "FREET4" in the last 168 hours.  BNP Recent Labs  Lab 04/03/22 1221  BNP 447.8*    DDimer No results for input(s): "DDIMER" in the last 168 hours.   Radiology    No results found.  Cardiac Studies   Cardiac Catheterization 08/13/15 LM ost 80 LAD prox 100 OM2 90 RCA prox 100 L-LAD ok S-OM1/OM2 prox 99 ISR S-RCA 100 (CTO) S-D1 prox 100 (CTO) PCI:  3.5 x 12 mm Resolute DES to S-OM1/OM2    Diagnostic Dominance: Right  Intervention      04/04/22 TTE   IMPRESSIONS     1. No LV thrombus seen with Definity contrast. Left ventricular  ejection  fraction, by estimation, is 25 to 30%. The left ventricle has severely  decreased function. The left ventricle demonstrates regional wall motion  abnormalities (see scoring  diagram/findings for description). The left ventricular internal cavity  size was mildly dilated. There is mild concentric left ventricular  hypertrophy. Left ventricular diastolic parameters are consistent with  Grade II diastolic dysfunction  (pseudonormalization). Elevated left atrial pressure. There is severe  hypokinesis of the left ventricular, entire inferolateral wall. There is  severe hypokinesis of the left ventricular, entire apical segment. There  is mild hypokinesis of the left  ventricular, basal-mid anteroseptal wall, anterior wall and inferior wall.   2. Right ventricular systolic function is normal. The right ventricular  size is normal. Tricuspid regurgitation signal is inadequate for assessing  PA pressure.   3. Left atrial size was mildly dilated.   4. The mitral valve is normal in structure. Mild to moderate mitral valve  regurgitation. Moderate mitral annular calcification.   5. Low flow low gradient aortic stenosis. The aortic valve was not well  visualized. There is moderate calcification of the aortic valve. There is  moderate thickening of the aortic valve. Aortic valve regurgitation is not  visualized. Moderate to severe  aortic valve stenosis.   6. The inferior vena cava is normal in size with greater than 50%  respiratory variability, suggesting right atrial pressure of 3 mmHg.   Comparison(s): The left ventricular function is significantly worse. The  left ventricular wall motion abnormalities are new.   FINDINGS   Left Ventricle: No LV thrombus seen with Definity contrast. Left  ventricular ejection fraction, by estimation, is 25 to 30%. The left  ventricle has severely decreased function. The left ventricle demonstrates  regional wall motion abnormalities. Severe   hypokinesis of the left ventricular, entire inferolateral wall. Severe  hypokinesis of the left ventricular, entire apical segment. Mild  hypokinesis of the left ventricular, basal-mid anteroseptal wall, anterior  wall and inferior wall. Definity contrast  agent was given IV to delineate the left ventricular endocardial borders.  The left ventricular internal cavity size was mildly dilated. There is  mild concentric left ventricular hypertrophy. Left ventricular diastolic  parameters are consistent with  Grade   II diastolic dysfunction (pseudonormalization). Elevated left atrial  pressure.   Right Ventricle: The right ventricular size is normal. No increase in  right ventricular wall thickness. Right ventricular systolic function is  normal. Tricuspid regurgitation signal is inadequate for assessing PA  pressure.   Left Atrium: Left atrial size was mildly dilated.   Right Atrium: Right atrial size was normal in size.   Pericardium: There is no evidence of pericardial effusion.   Mitral Valve: The mitral valve is normal in structure. There is mild  thickening of the mitral valve leaflet(s). Moderate mitral annular  calcification. Mild to moderate mitral valve regurgitation, with  centrally-directed jet.   Tricuspid Valve: The tricuspid valve is normal in structure. Tricuspid  valve regurgitation is not demonstrated.   Aortic Valve: Low flow low gradient aortic stenosis. The aortic valve was  not well visualized. There is moderate calcification of the aortic valve.  There is moderate thickening of the aortic valve. Aortic valve  regurgitation is not visualized. Moderate  to severe aortic stenosis is present. Aortic valve mean gradient measures  12.0 mmHg. Aortic valve peak gradient measures 18.0 mmHg. Aortic valve  area, by VTI measures 1.03 cm.   Pulmonic Valve: The pulmonic valve was normal in structure. Pulmonic valve  regurgitation is not visualized.   Aorta: The aortic  root and ascending aorta are structurally normal, with  no evidence of dilitation.   Venous: The inferior vena cava is normal in size with greater than 50%  respiratory variability, suggesting right atrial pressure of 3 mmHg.   IAS/Shunts: No atrial level shunt detected by color flow Doppler.   Patient Profile     89 y.o. male with chronic kidney disease creatinine approximately 2 at baseline with known coronary artery disease post bypass in 2001 with most recent stent placement to SVG OM.  Has occluded SVG to RCA and SVG to D1.  Here with non-ST elevation myocardial infarction.     Assessment & Plan    NSTEMI CAD s/p CABG   Hx of CAD status post CABG x5. Last cardiac catheterization May 2017 with patent LIMA-LAD, occluded SVG-RCA, SVG to diagonal with PCI/DES of SVG to OM for severe stenosis of proximal body of graft for in stent restenosis. Admitted with increasing weakness and epigastric pain that was responsive to nitro. Troponin 123 -> 111, up from baseline of 40-70. New ST depression in precordial leads V3 to V6.    Continue medical management given CKD stage IV. Suspect re-stenosis of SVG to OM graft. Unlikely to have PCI targets. Imdur 60 mg daily . Plavix 75 mg.   Acute on chronic HFrEF (worsening EF) Ischemic cardiomyopathy   TTE this admission shows LVEF 25-30%, down from 40-45% this past October. Has received some lasix with subsequent creatinine bump.    Increase Toprol XL 100 mg QD for persistent tachycardia  Continue Imdur 60 mg QD for afterload reduction May need intermittent lasix to maintain euvolemia. Agree with primary team plan to add daily oral lasix on discharge.  Unable to add additional GDMT 2/2 CKD and borderline BP Palliative care involved, will optimize care this admission and plan for hospice outpatient, now DNR   Aortic Stenosis, moderate to severe (low flow, low gradient)   Mean gradient 12.30mHg. Peak gradient 14mg. VTI 1.03 cm2. Patient would not  be a TAVR candidate.   CKD stage IV   Patient followed by Dr. FoRoyce Macadamiaith nephrology. Creatinine improved to 2.65 today.   Per primary team:  Chronic pain    [facility] will sign off.   Medication Recommendations:  Toprol 100 mg daily, Imdur 60 mg daily, Oral Lasix daily dosing per primary Other recommendations (labs, testing, etc):  None Follow up as an outpatient:  As needed, planning for outpatient hospice   For questions or updates, please contact [facility] Please consult [email or web address] for contact info under     Signed, Johny Blamer, DO  04/09/2022, 8:28 AM   Internal Medicine Resident, PGY-1 Pager# [phone]  Personally seen and examined. Agree with above.  89 year old with moderate to severe low-flow low gradient aortic stenosis chronic kidney disease stage IV with acute on chronic systolic heart failure EF 25 to 30%, gradually decreasing over the past several months.  Earlier this morning was anxious, asked to be sat up in bed.  He has been given IV Lasix 40 mg.  He may now transition to p.o. Lasix 40 mg daily.  In regards to his non-STEMI, continuing with aspirin and Plavix for medical management.  He is not a candidate for invasive therapies.  He is also on isosorbide 60 mg to help with anginal symptoms.  Seems to be doing fairly well with this.  We will increase his Toprol to 100 mg from 50.  Heart rate can certainly tolerate this.  Appreciate palliative care team discussion for goals of care.  He will be discharged on hospice therapy.  Please let us know if we can be of further assistance.  Candee Furbish, MD

## 2022-04-09 NOTE — Care Management Important Message (Signed)
Important Message  Patient Details  Name: ARDON FRANKLIN MRN: [id number] Date of Birth: 02-05-1932   Medicare Important Message Given:  Yes     Shelda Altes 04/09/2022, 10:05 AM

## 2022-04-09 NOTE — Inpatient Diabetes Management (Signed)
Inpatient Diabetes Program Recommendations  AACE/ADA: New Consensus Statement on Inpatient Glycemic Control (2015)  Target Ranges:  Prepandial:   less than 140 mg/dL      Peak postprandial:   less than 180 mg/dL (1-2 hours)      Critically ill patients:  140 - 180 mg/dL    Latest Reference Range & Units 04/08/22 07:56 04/08/22 11:45 04/08/22 17:35 04/08/22 22:16  Glucose-Capillary 70 - 99 mg/dL 200 (H)  3 units Novolog '@0900'$  284 (H)  8 units Novolog '@1338'$  248 (H)  5 units Novolog '@1850'$  243 (H)  2 units Novolog   (H): Data is abnormally high  Latest Reference Range & Units 04/09/22 09:03  Glucose-Capillary 70 - 99 mg/dL 307 (H)  11 units Novolog   (H): Data is abnormally high     Home DM Meds: Glipizide 5 mg BID   Current Orders: Novolog Moderate Correction Scale/ SSI (0-15 units) TID AC + HS    MD- Note elevated glucose levels.   Home Glipizide on hold.  If within goals of care for this pt, please consider adding basal insulin:  Semglee 12 units Daily (0.15 units/kg)     --Will follow patient during hospitalization--  Wyn Quaker RN, MSN, [facility] Diabetes Coordinator Inpatient Glycemic Control Team Team Pager: [phone] (8a-5p)

## 2022-04-09 NOTE — Progress Notes (Signed)
PROGRESS NOTE    [name]  NOB:[id number] DOB: [birth year] DOA: 04/03/2022 PCP: [name], MD   Brief Narrative:    [name] is a 89 y.o. male with medical history significant of diastolic CHF, CAD, interstitial lung disease, nocturnal hypoxemia on 2 L of nasal cannula oxygen, bladder cancer, rheumatoid arthritis, and CKD stage IV presents with complaints of chest pain over the last 2 days.  He has been admitted for chest pain evaluation with concerns for unstable angina in the setting of elevated troponin with prior history of significant CAD with CABG x 5 and multiple PCI's.  He is also noted to have possible community-acquired pneumonia and has been started on Rocephin and azithromycin.  2D echocardiogram pending, appreciate cardiology consultation with plans to start Imdur today given chest pain. Palliative care consulted and plan to go home with hospice once medically stable.  Chest pain improved, breathing slowly getting to baseline.  Plan to d/c home with hospice after hospital bed delivered (hopefully Saturday)   Assessment & Plan:   Principal Problem:   Elevated troponin Active Problems:   Atherosclerotic heart disease of native coronary artery with angina pectoris ([name])   Respiratory failure, chronic ([name])   CAP (community acquired pneumonia)   Heart failure with reduced ejection fraction ([name])   Leukocytosis   Hyperkalemia   Acute kidney injury superimposed on chronic kidney disease ([place])   Uncontrolled type 2 diabetes mellitus with hyperglycemia ([name])   ILD (interstitial lung disease) ([name])   GERD   History of bladder cancer  Assessment and Plan:  Elevated troponin CAD s/p CABG  Acute.   Patient presents with complaints of chest pain with exertion concerning for unstable angina.  High-sensitivity troponin elevated at 123.  Patient with prior history of CABG back in 2001 and multiple prior PCI's.  Last cardiac catheterization from 08/2015 noted patent  LIMA- LAD, occluded SVG-RCA, SVG to diagonal with PCI/DES of SVG to OM for severe stenosis of proximal body of graft for in-stent restenosis and recommended life long DAPT with ASA/plavix.  -Heparin drip stopped -Continue aspirin, Plavix, and  statin -Appreciate cardiology: Appreciate palliative care team's discussion, DNR.  Good hospice candidate. He is not a candidate for cardiac rehabilitation.  ok to add lasix daily  Possible community-acquired pneumonia chronic respiratory failure with hypoxemia Patient requiring 2 L nasal cannula oxygen continuously but normally only on 2 L of nasal cannula oxygen at night.  Reports that he has had a cough.  Chest x-ray concerning for either  multifocal pneumonia and/or asymmetric pulmonary edema.  On differential also includes the possibility of aspiration given prior history of achalasia and dysphagia. -Continue nasal cannula oxygen maintain O2 saturation greater than 92% -Follow-up influenza, COVID-19, and RSV screening -Procalcitonin not significantly elevated -Continue Rocephin and azithromycin.  De-escalate to oral on discharge if not fully treated -x ray on 1/2 shows continued fluid so IV lasix has been dosed daily-- will add PO lasix daily   Heart failure with reduced EF Suspect acute on chronic.  On physical exam patient does not appear grossly fluid overloaded.  Had been told to stop Lasix last week unless weight greater than 195.  BNP currently elevated at 447.8.  Last echocardiogram noted EF to be 40 to 45% with grade 1 diastolic dysfunction 94/7654.   -Strict I&Os and daily weights -IV lasix x 1 1/4 again   Leukocytosis Acute.  WBC elevated 10.6.  Question if this is reactive in nature or secondary to acute  infection. -Procalcitonin is not significantly elevated, but continue antibiotics for now and monitor    Acute kidney injury superimposed on chronic kidney disease stage IV On admission creatinine elevated up to 2.85 with BUN 45.   Creatinine previously noted to be 2.35 on 10/30. -Continues to have some creatinine elevation due to IV Lasix with adequate urine output noted; discontinue IV Lasix today -Follows with Dr. Royce Macadamia outpatient   Diabetes mellitus type 2 with hyperglycemia, without long-term use of insulin Glucose elevated at 304.  Last hemoglobin A1c 8.7 in 01/18/2022.  Home medications only include glipizide and is not on insulin. -Hold glipizide -CBGs before every meal with moderate SSI -NovoLog 0 - 5 units nightly -Adjust insulin regimen as needed   Bronchiectasis Patient followed by Dr. Estanislado Pandy of rheumatology and Dr. Chase Caller of pulmonology in the outpatient setting for bronchiectasis and mild interstitial lung disease in the setting Metoyer arthritis. -Continue outpatient follow-up  -aggressive pulm toilet   Essential hypertension -Currently with soft blood pressure readings, started on Imdur today per cardiology   History of bladder cancer -Continue outpatient follow-up with urology   History of seropositive rheumatoid arthritis   Hyperlipidemia -Continue Crestor   GERD -Continue Protonix    DVT prophylaxis:Heparin drip Code Status: Full Family Communication:called daughter 1/5 Disposition Plan: home with hospice Status is: Inpatient Remains inpatient appropriate because: home after hospital bed delivered   Consultants:  Cardiology Palliative care  Subjective: Breathing still not his normal, no further chest pain Feels congested   Objective: Vitals:   04/09/22 0743 04/09/22 0755 04/09/22 1050 04/09/22 1224  BP:  124/86 123/77 115/77  Pulse:  (!) 108 (!) 112 (!) 108  Resp:    20  Temp:  97.8 F (36.6 C)  97.7 F (36.5 C)  TempSrc:  Oral  Oral  SpO2: 96% 93% 94% 95%  Weight:      Height:        Intake/Output Summary (Last 24 hours) at 04/09/2022 1248 Last data filed at 04/09/2022 1226 Gross per 24 hour  Intake --  Output 3100 ml  Net -3100 ml   Filed Weights    04/07/22 0506 04/08/22 0400 04/09/22 0548  Weight: 85.6 kg 83.2 kg 86.1 kg    Examination:    General: Appearance:    frail male in no acute distress     Lungs:     Diminished,  respirations unlabored  Heart:    Tachycardic.   MS:   All extremities are intact.   Neurologic:   Awake, alert- flat affect         Data Reviewed: I have personally reviewed following labs and imaging studies  CBC: Recent Labs  Lab 04/03/22 1221 04/04/22 0028 04/05/22 0141 04/06/22 0245 04/07/22 0148  WBC 10.6* 10.8* 9.7 9.1 10.7*  HGB 11.5* 11.7* 11.2* 11.5* 11.8*  HCT 35.3* 36.9* 32.9* 34.5* 35.1*  MCV 87.8 87.9 85.7 84.6 84.2  PLT 194 184 190 207 782   Basic Metabolic Panel: Recent Labs  Lab 04/05/22 0141 04/06/22 0245 04/07/22 0148 04/08/22 0907 04/09/22 0545  NA 132* 132* 131* 135 134*  K 4.3 4.3 4.0 4.1 4.2  CL 97* 96* 96* 98 98  CO2 23 22 21* 24 25  GLUCOSE 165* 164* 175* 205* 262*  BUN 58* 65* 72* 82* 91*  CREATININE 3.29* 3.00* 2.83* 2.76* 2.65*  CALCIUM 9.0 9.2 9.3 9.2 9.5  MG 2.0  --   --   --   --    GFR: Estimated  Creatinine Clearance: 21.1 mL/min (A) (by C-G formula based on SCr of 2.65 mg/dL (H)). Liver Function Tests: No results for input(s): "AST", "ALT", "ALKPHOS", "BILITOT", "PROT", "ALBUMIN" in the last 168 hours. No results for input(s): "LIPASE", "AMYLASE" in the last 168 hours. No results for input(s): "AMMONIA" in the last 168 hours. Coagulation Profile: No results for input(s): "INR", "PROTIME" in the last 168 hours. Cardiac Enzymes: No results for input(s): "CKTOTAL", "CKMB", "CKMBINDEX", "TROPONINI" in the last 168 hours. BNP (last 3 results) No results for input(s): "PROBNP" in the last 8760 hours. HbA1C: No results for input(s): "HGBA1C" in the last 72 hours. CBG: Recent Labs  Lab 04/08/22 0756 04/08/22 1145 04/08/22 1735 04/08/22 2216 04/09/22 0903  GLUCAP 200* 284* 248* 243* 307*   Lipid Profile: No results for input(s): "CHOL",  "HDL", "LDLCALC", "TRIG", "CHOLHDL", "LDLDIRECT" in the last 72 hours. Thyroid Function Tests: No results for input(s): "TSH", "T4TOTAL", "FREET4", "T3FREE", "THYROIDAB" in the last 72 hours. Anemia Panel: No results for input(s): "VITAMINB12", "FOLATE", "FERRITIN", "TIBC", "IRON", "RETICCTPCT" in the last 72 hours. Sepsis Labs: Recent Labs  Lab 04/03/22 1441 04/03/22 1633  PROCALCITON  --  0.19  LATICACIDVEN 1.7 1.2    Recent Results (from the past 240 hour(s))  Culture, blood (routine x 2)     Status: None   Collection Time: 04/03/22  2:41 PM   Specimen: BLOOD RIGHT HAND  Result Value Ref Range Status   Specimen Description BLOOD RIGHT HAND  Final   Special Requests   Final    BOTTLES DRAWN AEROBIC AND ANAEROBIC Blood Culture results may not be optimal due to an inadequate volume of blood received in culture bottles   Culture   Final    NO GROWTH 5 DAYS Performed at [facility], [place] [address], [place], [place] [address]    Report Status 04/08/2022 FINAL  Final  Resp panel by RT-PCR (RSV, Flu A&B, Covid) Anterior Nasal Swab     Status: None   Collection Time: 04/03/22  3:00 PM   Specimen: Anterior Nasal Swab  Result Value Ref Range Status   SARS Coronavirus 2 by RT PCR NEGATIVE NEGATIVE Final    Comment: (NOTE) SARS-CoV-2 target nucleic acids are NOT DETECTED.  The SARS-CoV-2 RNA is generally detectable in upper respiratory specimens during the acute phase of infection. The lowest concentration of SARS-CoV-2 viral copies this assay can detect is 138 copies/mL. A negative result does not preclude SARS-Cov-2 infection and should not be used as the sole basis for treatment or other patient management decisions. A negative result may occur with  improper specimen collection/handling, submission of specimen other than nasopharyngeal swab, presence of viral mutation(s) within the areas targeted by this assay, and inadequate number of viral copies(<138 copies/mL). A  negative result must be combined with clinical observations, patient history, and epidemiological information. The expected result is Negative.  Fact Sheet for Patients:  EntrepreneurPulse.com.au  Fact Sheet for Healthcare Providers:  IncredibleEmployment.be  This test is no t yet approved or cleared by the [place] FDA and  has been authorized for detection and/or diagnosis of SARS-CoV-2 by FDA under an Emergency Use Authorization (EUA). This EUA will remain  in effect (meaning this test can be used) for the duration of the COVID-19 declaration under Section 564(b)(1) of the Act, 21 U.S.C.section 360bbb-3(b)(1), unless the authorization is terminated  or revoked sooner.       Influenza A by PCR NEGATIVE NEGATIVE Final   Influenza B by PCR NEGATIVE  NEGATIVE Final    Comment: (NOTE) The Xpert Xpress SARS-CoV-2/FLU/RSV plus assay is intended as an aid in the diagnosis of influenza from Nasopharyngeal swab specimens and should not be used as a sole basis for treatment. Nasal washings and aspirates are unacceptable for Xpert Xpress SARS-CoV-2/FLU/RSV testing.  Fact Sheet for Patients: EntrepreneurPulse.com.au  Fact Sheet for Healthcare Providers: IncredibleEmployment.be  This test is not yet approved or cleared by the [place] FDA and has been authorized for detection and/or diagnosis of SARS-CoV-2 by FDA under an Emergency Use Authorization (EUA). This EUA will remain in effect (meaning this test can be used) for the duration of the COVID-19 declaration under Section 564(b)(1) of the Act, 21 U.S.C. section 360bbb-3(b)(1), unless the authorization is terminated or revoked.     Resp Syncytial Virus by PCR NEGATIVE NEGATIVE Final    Comment: (NOTE) Fact Sheet for Patients: EntrepreneurPulse.com.au  Fact Sheet for Healthcare  Providers: IncredibleEmployment.be  This test is not yet approved or cleared by the [place] FDA and has been authorized for detection and/or diagnosis of SARS-CoV-2 by FDA under an Emergency Use Authorization (EUA). This EUA will remain in effect (meaning this test can be used) for the duration of the COVID-19 declaration under Section 564(b)(1) of the Act, 21 U.S.C. section 360bbb-3(b)(1), unless the authorization is terminated or revoked.  Performed at [facility], [place] [address] [place], [place] [address]   Culture, blood (routine x 2)     Status: None   Collection Time: 04/03/22  4:33 PM   Specimen: BLOOD  Result Value Ref Range Status   Specimen Description BLOOD RIGHT ANTECUBITAL  Final   Special Requests   Final    BOTTLES DRAWN AEROBIC ONLY Blood Culture results may not be optimal due to an inadequate volume of blood received in culture bottles   Culture   Final    NO GROWTH 5 DAYS Performed at [facility], [place] [address], [place], [place] [address]    Report Status 04/08/2022 FINAL  Final         Radiology Studies: No results found.      Scheduled Meds:  aspirin EC  81 mg Oral QHS   clopidogrel  75 mg Oral Daily   finasteride  5 mg Oral Daily   [START ON 04/10/2022] furosemide  40 mg Oral Daily   guaiFENesin  600 mg Oral BID   insulin aspart  0-15 Units Subcutaneous TID WC   insulin aspart  0-5 Units Subcutaneous QHS   isosorbide mononitrate  60 mg Oral Daily   metoprolol succinate  100 mg Oral Daily   nortriptyline  10-20 mg Oral QHS   pantoprazole  40 mg Oral Daily   pregabalin  50 mg Oral QHS   sodium chloride flush  3 mL Intravenous Q12H   tamsulosin  0.4 mg Oral QHS   umeclidinium bromide  1 puff Inhalation Daily   Continuous Infusions:     LOS: 6 days    Time spent: 35 minutes    Geradine Girt, DO [facility]  If 7PM-7AM, please contact night-coverage [email or web address] 04/09/2022, 12:48 PM

## 2022-04-09 NOTE — Progress Notes (Signed)
Occupational Therapy Discharge Patient Details Name: [name] MRN: [id number] DOB: [birth year] Today's Date: 04/09/2022 Time:  -     Patient discharged from OT services secondary to  plan is hospice 04/10/22  .  Please see latest therapy progress note for current level of functioning and progress toward goals.    Progress and discharge plan discussed with patient and/or caregiver: Patient/Caregiver agrees with plan   GO     Jeri Modena 04/09/2022, 4:07 PM

## 2022-04-09 NOTE — Progress Notes (Signed)
[facility]) Hospital Liaison Note  Per [facility], the DME will be delivered tomorrow. Patient's daughter [name] aware and agreeable to this plan.   Plan is for patient to discharge home once DME has arrived.   Please send patient home with comfort medications/prescriptions at discharge.   Please call with any questions or concerns. Thank you  Roselee Nova, [facility] Liaison [phone]

## 2022-04-10 DIAGNOSIS — R7989 Other specified abnormal findings of blood chemistry: Secondary | ICD-10-CM | POA: Diagnosis not present

## 2022-04-10 LAB — GLUCOSE, CAPILLARY
Glucose-Capillary: 239 mg/dL — ABNORMAL HIGH (ref 70–99)
Glucose-Capillary: 254 mg/dL — ABNORMAL HIGH (ref 70–99)
Glucose-Capillary: 217 mg/dL — ABNORMAL HIGH (ref 70–99)
Glucose-Capillary: 205 mg/dL — ABNORMAL HIGH (ref 70–99)

## 2022-04-10 MED ORDER — PHENOL 1.4 % MT LIQD
1.0000 | OROMUCOSAL | Status: DC | PRN
  Administered 2022-04-10: 1 via OROMUCOSAL
  Filled 2022-04-10: qty 177

## 2022-04-10 MED ORDER — ORAL CARE MOUTH RINSE: 15.0000 mL | OROMUCOSAL | Status: DC | PRN

## 2022-04-10 NOTE — TOC Progression Note (Addendum)
Transition of Care [facility]) - Progression Note    Patient Details  Name: [name] MRN: [id number] Date of Birth: 03-11-1932  [facility] CM/SW Contact  Bartholomew Crews, RN Phone Number: [phone] 04/10/2022, 10:11 AM  Clinical Narrative:     Spoke with patient's daughter, [name], on her cell phone to discuss post acute transition. [name] has not heard from DME provider today, but was told yesterday that deliveries may be delayed by pending inclement weather. Discussed potential delays with transport d/t inclement weather. Verified home address. Provided RNCM contact information. [name] will notify RNCM of DME delivery updates. Liaison at [facility], [place], aware. TOC following for transition needs.   UPDATE: [facility] unable to deliver DME until Monday. Fabio Pierce has updated [name]. Will dc home on Monday.   Expected Discharge Plan: Home w Hospice Care Barriers to Discharge: Continued Medical Work up  Expected Discharge Plan and Services In-house Referral: NA Discharge Planning Services: CM Consult Post Acute Care [facility]: Hospice Living arrangements for the past 2 months: Single Family Home Expected Discharge Date: 04/10/22                 DME Agency: NA                   Social Determinants of Health (SDOH) Interventions SDOH Screenings   Depression (PHQ2-9): Low Risk  (12/02/2020)  Financial Resource Strain: Low Risk  (07/25/2020)  Tobacco Use: Medium Risk (04/03/2022)    Readmission Risk Interventions    04/07/2022    4:44 PM  Readmission Risk Prevention Plan  Transportation Screening Complete  Medication Review (RN Care Manager) Referral to Pharmacy  [place] or [place] Complete  SW Recovery Care/Counseling Consult Complete  Palliative Care Screening Complete  [place] Not Applicable

## 2022-04-10 NOTE — Progress Notes (Signed)
PROGRESS NOTE    [name]  BZJ:[id number] DOB: 1932/01/06 DOA: 04/03/2022 PCP: [name] Carbon, MD   Brief Narrative:    [name] is a 89 y.o. male with medical history significant of diastolic CHF, CAD, interstitial lung disease, nocturnal hypoxemia on 2 L of nasal cannula oxygen, bladder cancer, rheumatoid arthritis, and CKD stage IV presents with complaints of chest pain over the last 2 days.  He has been admitted for chest pain evaluation with concerns for unstable angina in the setting of elevated troponin with prior history of significant CAD with CABG x 5 and multiple PCI's.  He is also noted to have possible community-acquired pneumonia and has been started on Rocephin and azithromycin.  2D echocardiogram pending, appreciate cardiology consultation with plans to start Imdur today given chest pain. Palliative care consulted and plan to go home with hospice once medically stable.  Chest pain improved, breathing slowly getting to baseline.  Plan to d/c home with hospice after hospital bed delivered (hopefully Saturday)   Assessment & Plan:   Principal Problem:   Elevated troponin Active Problems:   Atherosclerotic heart disease of native coronary artery with angina pectoris ([name])   Respiratory failure, chronic ([name])   CAP (community acquired pneumonia)   Heart failure with reduced ejection fraction ([name])   Leukocytosis   Hyperkalemia   Acute kidney injury superimposed on chronic kidney disease ([name])   Uncontrolled type 2 diabetes mellitus with hyperglycemia ([name])   ILD (interstitial lung disease) ([name])   GERD   History of bladder cancer  Assessment and Plan: Goals of care discussion, DNR status: Plan to discharge home with hospice based on multiple goals of care discussions this admission. Comfort medications have been ordered and will be prescribed at discharge. Discharge delayed by unavailability of DME at home.   Elevated troponin CAD s/p CABG  Acute.    Patient presents with complaints of chest pain with exertion concerning for unstable angina.  High-sensitivity troponin elevated at 123.  Patient with prior history of CABG back in 2001 and multiple prior PCI's.  Last cardiac catheterization from 08/2015 noted patent LIMA- LAD, occluded SVG-RCA, SVG to diagonal with PCI/DES of SVG to OM for severe stenosis of proximal body of graft for in-stent restenosis and recommended life long DAPT with ASA/plavix.  -Heparin drip stopped -Continue aspirin, Plavix, and  statin -Appreciate cardiology: Appreciate palliative care team's discussion, DNR.  Good hospice candidate. He is not a candidate for cardiac rehabilitation.  ok to add lasix daily  Possible community-acquired pneumonia chronic respiratory failure with hypoxemia Patient requiring 2 L nasal cannula oxygen continuously but normally only on 2 L of nasal cannula oxygen at night.  Reports that he has had a cough.  Chest x-ray concerning for either  multifocal pneumonia and/or asymmetric pulmonary edema.  On differential also includes the possibility of aspiration given prior history of achalasia and dysphagia. -Continue nasal cannula oxygen maintain O2 saturation greater than 92% -Follow-up influenza, COVID-19, and RSV screening -Procalcitonin not significantly elevated -Continue Rocephin and azithromycin.  De-escalate to oral on discharge if not fully treated -x ray on 1/2 shows continued fluid so IV lasix has been dosed daily-- will add PO lasix daily   Heart failure with reduced EF Suspect acute on chronic.  On physical exam patient does not appear grossly fluid overloaded.  Had been told to stop Lasix last week unless weight greater than 195.  BNP currently elevated at 447.8.  Last echocardiogram noted EF to be 40  to 45% with grade 1 diastolic dysfunction 72/5366.   -Strict I&Os and daily weights -IV lasix x 1 1/4 again   Leukocytosis Acute.  WBC elevated 10.6.  Question if this is reactive in  nature or secondary to acute infection. -Procalcitonin is not significantly elevated, but continue antibiotics for now and monitor    Acute kidney injury superimposed on chronic kidney disease stage IV On admission creatinine elevated up to 2.85 with BUN 45.  Creatinine previously noted to be 2.35 on 10/30. -Continues to have some creatinine elevation due to IV Lasix with adequate urine output noted; discontinue IV Lasix today -Follows with Dr. Royce [name] outpatient   Diabetes mellitus type 2 with hyperglycemia, without long-term use of insulin Glucose elevated at 304.  Last hemoglobin A1c 8.7 in 01/18/2022.  Home medications only include glipizide and is not on insulin. -Hold glipizide -CBGs before every meal with moderate SSI -NovoLog 0 - 5 units nightly -Adjust insulin regimen as needed   Bronchiectasis Patient followed by Dr. Estanislado [name] of rheumatology and Dr. Chase [name] of pulmonology in the outpatient setting for bronchiectasis and mild interstitial lung disease in the setting [place] arthritis. -Continue outpatient follow-up  -aggressive pulm toilet   Essential hypertension -Currently with soft blood pressure readings, started on Imdur today per cardiology   History of bladder cancer -Continue outpatient follow-up with urology   History of seropositive rheumatoid arthritis   Hyperlipidemia -Continue Crestor   GERD -Continue Protonix  Family Communication: daughter 1/5 Disposition Plan: home with hospice after hospital bed delivered which is apparently delayed until 1/8.  Consultants:  Cardiology Palliative care  Subjective: Breathing better today, has no complaints.   Objective: BP 111/81 (BP Location: Right Arm)   Pulse (!) 101   Temp 97.6 F (36.4 C) (Axillary)   Resp 20   Ht 6' (1.829 m)   Wt 85.7 kg   SpO2 96%   BMI 25.62 kg/m   Gen: Frail elderly male in no distress Pulm: Nonlabored  CV: Borderline tachycardia, no edema GI: Soft, NT, ND, +BS  Neuro:  Alert and oriented. No new focal deficits. Ext: Warm, no deformities Skin: No acute rashes, lesions or ulcers on visualized skin   Time spent: 35 minutes    [name] Pour, MD [facility]  If 7PM-7AM, please contact night-coverage [email or web address] 04/10/2022, 4:04 PM

## 2022-04-11 DIAGNOSIS — N179 Acute kidney failure, unspecified: Secondary | ICD-10-CM | POA: Diagnosis not present

## 2022-04-11 DIAGNOSIS — J189 Pneumonia, unspecified organism: Secondary | ICD-10-CM | POA: Diagnosis not present

## 2022-04-11 DIAGNOSIS — Z7189 Other specified counseling: Secondary | ICD-10-CM

## 2022-04-11 DIAGNOSIS — R7989 Other specified abnormal findings of blood chemistry: Secondary | ICD-10-CM | POA: Diagnosis not present

## 2022-04-11 DIAGNOSIS — I5020 Unspecified systolic (congestive) heart failure: Secondary | ICD-10-CM | POA: Diagnosis not present

## 2022-04-11 LAB — GLUCOSE, CAPILLARY
Glucose-Capillary: 244 mg/dL — ABNORMAL HIGH (ref 70–99)
Glucose-Capillary: 251 mg/dL — ABNORMAL HIGH (ref 70–99)
Glucose-Capillary: 290 mg/dL — ABNORMAL HIGH (ref 70–99)
Glucose-Capillary: 322 mg/dL — ABNORMAL HIGH (ref 70–99)

## 2022-04-11 MED ORDER — INSULIN GLARGINE-YFGN 100 UNIT/ML ~~LOC~~ SOLN
15.0000 [IU] | Freq: Every day | SUBCUTANEOUS | Status: DC
  Administered 2022-04-11 – 2022-04-12 (×2): 15 [IU] via SUBCUTANEOUS
  Filled 2022-04-11 (×2): qty 0.15

## 2022-04-11 NOTE — Progress Notes (Signed)
[employer] [facility]) Hospital Liaison Note  Plan is for DME to be delivered tomorrow per [facility]. [facility] will see patient for admission visit once discharged and time is arranged and agreed upon with daughter. Please call with any questions or concerns. Thank you  Roselee Nova, [place] [facility] Liaison  [phone]

## 2022-04-11 NOTE — Progress Notes (Signed)
Daily Progress Note   Patient Name: [name]       Date: 04/11/2022 DOB: Aug 12, 1931  Age: 89 y.o. MRN#: [id number] Attending Physician: [name], MD Primary Care Physician: [name], MD Admit Date: 04/03/2022  Reason for Consultation/Follow-up: Establishing goals of care  Patient Profile/HPI:   89 y.o. male  with past medical history of RA with lung involvement, CKD, CHF, DM, CAD s/p CABG x5, macular degeneration admitted on 04/03/2022 with shortness of breath and chest pain. Workup reveals pneumonia, NSTEMI, worsening heart failure with TEE indicating CHF at 25-30% which is worse from October EF 40-45%. Acute on chronic kidney injury- Cr 2.83, trending down from max 3.29 after receiving lasix. Per Cardiology no good options for interventions. Palliative medicine consulted for [facility].      Subjective: Chart reviewed including labs, progress notes, imaging from this and previous encounters.  Noted plan for d/c tomorrow- awaiting equipment.  On eval [name] is awake and alert, speech is garbled. PRN medication use reviewed.  He appears comfortable.   Review of Systems  Respiratory:  Positive for shortness of breath.   Neurological:  Positive for weakness.     Physical Exam Vitals and nursing note reviewed.  Constitutional:      Appearance: He is ill-appearing.  Pulmonary:     Comments: Dyspnea when speaking  Neurological:     Mental Status: He is alert.     Comments: UTA orientation d/t garbled speech             Vital Signs: BP 109/78 (BP Location: Left Arm)   Pulse 90   Temp 97.6 F (36.4 C) (Oral)   Resp 18   Ht 6' (1.829 m)   Wt 85.8 kg   SpO2 97%   BMI 25.65 kg/m  SpO2: SpO2: 97 % O2 Device: O2 Device: Nasal Cannula O2 Flow Rate: O2 Flow Rate (L/min):  3 L/min  Intake/output summary:  Intake/Output Summary (Last 24 hours) at 04/11/2022 1241 Last data filed at 04/11/2022 1100 Gross per 24 hour  Intake 500 ml  Output 700 ml  Net -200 ml   LBM: Last BM Date : 04/10/22 Baseline Weight: Weight: 88.5 kg Most recent weight: Weight: 85.8 kg       Palliative Assessment/Data: PPS: 40%      Patient Active  Problem List   Diagnosis Date Noted   Elevated troponin 04/03/2022   CAP (community acquired pneumonia) 04/03/2022   Heart failure with reduced ejection fraction ([place]) 04/03/2022   Leukocytosis 04/03/2022   Hyperkalemia 04/03/2022   Acute kidney injury superimposed on chronic kidney disease ([place]) 04/03/2022   History of bladder cancer 04/03/2022   Esophageal dysmotility    UTI (urinary tract infection) 01/28/2021   Bladder cancer ([place]) 01/28/2021   Weakness 10/29/2020   Macular degeneration, wet ([place]) 06/18/2020   Pain due to onychomycosis of toenails of both feet 05/22/2020   Ventricular tachycardia ([place]) 04/23/2020   Chronic narcotic dependence ([place]) 12/14/2019   Neuropathy 12/14/2018   Stage 3b chronic kidney disease ([place]) 12/14/2018   Problems with swallowing and mastication    Achalasia    Right inguinal hernia 07/04/2018   Advance directive discussed with patient 04/28/2018   Chronic pain of both lower extremities 10/26/2017   Neuropathic pain 10/26/2017   Chronic pain syndrome 10/26/2017   Iron deficiency anemia 07/05/2017   Constipation 07/05/2017   CKD (chronic kidney disease) stage 4, GFR 15-29 ml/min ([name]) 05/17/2017   Seropositive rheumatoid arthritis ([place]) 05/17/2017   Uncontrolled type 2 diabetes mellitus with hyperglycemia ([place]) 05/17/2017   Atherosclerotic heart disease of native coronary artery with angina pectoris ([place]) 05/17/2017   Dysphagia 05/17/2017   High risk medication use 10/21/2016   Primary osteoarthritis of both knees 10/21/2016   History of right knee joint replacement 10/21/2016    Orthostatic hypotension 10/01/2016   Spinal stenosis of lumbar region without neurogenic claudication 03/23/2016   Lumbar facet arthropathy 03/23/2016   Respiratory failure, chronic ([place]) 11/21/2014   Rheumatoid arthritis ([place]) 11/05/2014   Rheumatic fever without heart involvement 03/04/2014   Ventral hernia 12/17/2013   ILD (interstitial lung disease) ([place]) 11/28/2011   Heart failure with mildly reduced ejection fraction (HFmrEF) ([place]) 09/14/2011   Orthostatic dizziness 04/27/2011   Nausea 02/16/2011   Diabetic polyneuropathy associated with type 2 diabetes mellitus ([place]) 08/10/2010   Type 2 diabetes mellitus with diabetic polyneuropathy ([place]) 08/10/2010   Obstructive sleep apnea 12/17/2008   ESOPHAGEAL STRICTURE 10/09/2008   Esophageal stricture 10/09/2008   Reflux esophagitis 09/10/2008   Diverticulosis of colon 09/10/2008   Gastro-esophageal reflux disease with esophagitis 09/10/2008   Coronary atherosclerosis 03/19/2008   Aortic stenosis 03/19/2008   Thoracic aorta atherosclerosis ([place]) 03/19/2008   Sleep disturbance 10/17/2006   Disturbance in sleep behavior 10/17/2006   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   Essential hypertension 09/23/2006   GERD 09/23/2006   BENIGN PROSTATIC HYPERTROPHY 09/23/2006   Enlarged prostate without lower urinary tract symptoms (luts) 09/23/2006   HLD (hyperlipidemia) 09/23/2006    Palliative Care Assessment & Plan    Assessment/Recommendations/Plan  Continue current plan of care Comfort measures as ordered Plan for d/c with hospice tomorrow after bed is delivered   Code Status: DNR  Prognosis:  < 3 months  Discharge Planning: Home with Hospice  Care plan was discussed with care team.   Thank you for allowing the Palliative Medicine Team to assist in the care of this patient.   Greater than 50%  of this time was spent counseling and coordinating care related to the above assessment and plan.  Mariana Kaufman, AGNP-C Palliative Medicine   Please contact Palliative Medicine Team phone at [phone] for questions and concerns.

## 2022-04-11 NOTE — Progress Notes (Signed)
PROGRESS NOTE    [name]  BOF:[id number] DOB: [birth year] DOA: 04/03/2022 PCP: [name], MD   Brief Narrative:  [name] is an 89 y.o. male with medical history significant of diastolic CHF, CAD, interstitial lung disease, nocturnal hypoxemia on 2 L of nasal cannula oxygen, bladder cancer, rheumatoid arthritis, and CKD stage IV presents with complaints of chest pain over the last 2 days.  He has been admitted for chest pain evaluation with concerns for unstable angina in the setting of elevated troponin with prior history of significant CAD with CABG x 5 and multiple PCI's.  He is also noted to have possible community-acquired pneumonia and has been started on Rocephin and azithromycin.  2D echocardiogram pending, appreciate cardiology consultation with plans to start Imdur today given chest pain. Palliative care consulted and plan to go home with hospice once medically stable.  Chest pain improved, breathing slowly getting to baseline.  Plan to d/c home with hospice after hospital bed delivered which is delayed until 1/8.  Assessment & Plan:   Principal Problem:   Elevated troponin Active Problems:   Atherosclerotic heart disease of native coronary artery with angina pectoris ([name])   Respiratory failure, chronic ([name])   CAP (community acquired pneumonia)   Heart failure with reduced ejection fraction ([name])   Leukocytosis   Hyperkalemia   Acute kidney injury superimposed on chronic kidney disease ([place])   Uncontrolled type 2 diabetes mellitus with hyperglycemia ([name])   ILD (interstitial lung disease) ([name])   GERD   History of bladder cancer  Assessment and Plan: Goals of care discussion, DNR status: Plan to discharge home with hospice based on multiple goals of care discussions this admission. Comfort medications have been ordered and will be prescribed at discharge. Discharge delayed by unavailability of DME at home.   Elevated troponin CAD s/p CABG  Acute.    Patient presents with complaints of chest pain with exertion concerning for unstable angina.  High-sensitivity troponin elevated at 123.  Patient with prior history of CABG back in 2001 and multiple prior PCI's.  Last cardiac catheterization from 08/2015 noted patent LIMA- LAD, occluded SVG-RCA, SVG to diagonal with PCI/DES of SVG to OM for severe stenosis of proximal body of graft for in-stent restenosis and recommended life long DAPT with ASA/plavix.  -Heparin drip stopped -Continue aspirin, plavix, cardioselective BB. -Appreciate cardiology: Appreciate palliative care team's discussion, DNR.  Good hospice candidate. He is not a candidate for cardiac rehabilitation.  ok to add lasix daily  Possible community-acquired pneumonia chronic respiratory failure with hypoxemia Patient requiring 2 L nasal cannula oxygen continuously but normally only on 2 L of nasal cannula oxygen at night.  Reports that he has had a cough.  Chest x-ray concerning for either  multifocal pneumonia and/or asymmetric pulmonary edema.  On differential also includes the possibility of aspiration given prior history of achalasia and dysphagia. Complete abx for PNA. Diuresed.  - Continuing supplemental oxygen continuously to maintain adequate oxygenation as well as avoid respiratory distress.  - Continue po lasix as below  Acute on chronic HFrEF:  Suspect acute on chronic.  On physical exam patient does not appear grossly fluid overloaded.  Had been told to stop Lasix last week unless weight greater than 195.  BNP currently elevated at 447.8.  Last echocardiogram noted EF to be 40 to 45% with grade 1 diastolic dysfunction 77/8242.   - Lasix '40mg'$  po daily.  - Strict I&Os and daily weights   Leukocytosis: Stable. Received  5 days of ceftriaxone and azithromycin without change, remained afebrile throughout. Could consider recheck at follow up if goals of care were not hospice-centered.  Acute kidney injury superimposed on chronic  kidney disease stage IV On admission creatinine elevated up to 2.85 with BUN 45.  Creatinine previously noted to be 2.35 on 10/30. -Continues to have some creatinine elevation due to IV Lasix with adequate urine output noted. Will not plan to continue BMP checks or outpatient nephrology follow up given his goals of care. -Follows with Dr. Royce Macadamia outpatient   Diabetes mellitus type 2 with hyperglycemia, without long-term use of insulin Hb A1c 8.7% in 01/18/2022.  - Giving basal-bolus insulin while inpatient. Will continue that for now, but should be able to return to [place] at discharge alone (vs. purely comfort measures off glycemic medication). Would avoid metformin with low CrCl. There is concern for inability to administer insulin at home as well. With hospice at home, would not encourage CBG checks either. He would not be expected to be very symptomatic from that level of hyperglycemia and risks hypoglycemia as his oral intake declines.   Bronchiectasis: Patient followed by Dr. Estanislado Pandy of rheumatology and Dr. Chase Caller of pulmonology in the outpatient setting for bronchiectasis and ILD with RA. - Continue controlled inhaler and prn. Continue antitussive and pulmonary hygiene   Essential hypertension: Stable - Continue isosorbide as antianginal.   History of bladder cancer - Has followed with urology. Has no new LUTS. Continue tamsulosin, finasteride.     Hyperlipidemia - No need to continue rosuvastatin with home hospice.   GERD - Continue protonix  Disposition Plan: home with hospice after hospital bed delivered which is apparently delayed until 1/8.  Consultants:  Cardiology Palliative care  Subjective: Again no complaints today, eager to get home.   Objective: BP 111/81 (BP Location: Right Arm)   Pulse (!) 101   Temp 97.6 F (36.4 C) (Axillary)   Resp 20   Ht 6' (1.829 m)   Wt 85.7 kg   SpO2 96%   BMI 25.62 kg/m   Elderly frail ill-appearing male in no acute  distress No wheezing, though does have prolonged expiratory phase Tachycardia Awake, alert, interactive, dysarthria stable, MAE  Time spent: 35 minutes    [name], MD [facility]  If 7PM-7AM, please contact night-coverage [email or web address] 04/10/2022, 4:04 PM

## 2022-04-12 ENCOUNTER — Other Ambulatory Visit (HOSPITAL_COMMUNITY): Payer: Self-pay

## 2022-04-12 ENCOUNTER — Telehealth: Payer: Self-pay | Admitting: Internal Medicine

## 2022-04-12 DIAGNOSIS — R7989 Other specified abnormal findings of blood chemistry: Secondary | ICD-10-CM | POA: Diagnosis not present

## 2022-04-12 LAB — GLUCOSE, CAPILLARY
Glucose-Capillary: 218 mg/dL — ABNORMAL HIGH (ref 70–99)
Glucose-Capillary: 221 mg/dL — ABNORMAL HIGH (ref 70–99)

## 2022-04-12 MED ORDER — OXYCODONE HCL 5 MG/5ML PO SOLN
2.5000 mg | ORAL | 0 refills | Status: DC | PRN
  Filled 2022-04-12: qty 15, 1d supply, fill #0

## 2022-04-12 MED ORDER — FUROSEMIDE 40 MG PO TABS
40.0000 mg | ORAL_TABLET | Freq: Every day | ORAL | 0 refills | Status: DC
  Filled 2022-04-12: qty 30, 30d supply, fill #0

## 2022-04-12 MED ORDER — METOPROLOL SUCCINATE ER 100 MG PO TB24
100.0000 mg | ORAL_TABLET | Freq: Every day | ORAL | 0 refills | Status: DC
  Filled 2022-04-12: qty 30, 30d supply, fill #0

## 2022-04-12 MED ORDER — CLOPIDOGREL BISULFATE 75 MG PO TABS
75.0000 mg | ORAL_TABLET | Freq: Every day | ORAL | 0 refills | Status: DC
  Filled 2022-04-12: qty 30, 30d supply, fill #0

## 2022-04-12 MED ORDER — LORAZEPAM 0.5 MG PO TABS
0.2500 mg | ORAL_TABLET | Freq: Four times a day (QID) | ORAL | 0 refills | Status: DC | PRN
  Filled 2022-04-12: qty 5, 3d supply, fill #0

## 2022-04-12 MED ORDER — ISOSORBIDE MONONITRATE ER 60 MG PO TB24
60.0000 mg | ORAL_TABLET | Freq: Every day | ORAL | 0 refills | Status: DC
  Filled 2022-04-12: qty 30, 30d supply, fill #0

## 2022-04-12 MED ORDER — ALBUTEROL SULFATE (2.5 MG/3ML) 0.083% IN NEBU
2.5000 mg | INHALATION_SOLUTION | Freq: Four times a day (QID) | RESPIRATORY_TRACT | 0 refills | Status: DC | PRN
  Filled 2022-04-12: qty 90, 8d supply, fill #0

## 2022-04-12 NOTE — Telephone Encounter (Signed)
Kyra from [facility] called in wants to know if Dr Silvio Pate will be pt attending provider . Please advise # [phone]

## 2022-04-12 NOTE — Care Management Important Message (Signed)
Important Message  Patient Details  Name: [name] MRN: [id number] Date of Birth: 06-Sep-1931   Medicare Important Message Given:  Yes     Shelda Altes 04/12/2022, 10:13 AM

## 2022-04-12 NOTE — Discharge Summary (Signed)
Physician Discharge Summary   Patient: [name] MRN: [id number] DOB: September 27, 1931  Admit date:     04/03/2022  Discharge date: 04/12/22  Discharge Physician: [name]   PCP: [name], MD   Recommendations at discharge:  Follow up with [facility].  Discharge Diagnoses: Principal Problem:   Elevated troponin Active Problems:   Atherosclerotic heart disease of native coronary artery with angina pectoris ([name])   Respiratory failure, chronic ([name])   CAP (community acquired pneumonia)   Heart failure with reduced ejection fraction ([name])   Leukocytosis   Hyperkalemia   Acute kidney injury superimposed on chronic kidney disease ([place])   Uncontrolled type 2 diabetes mellitus with hyperglycemia ([name])   ILD (interstitial lung disease) ([place])   GERD   History of bladder cancer  Hospital Course: [name] is an 89 y.o. male with medical history significant of diastolic CHF, CAD, interstitial lung disease, nocturnal hypoxemia on 2 L of nasal cannula oxygen, bladder cancer, rheumatoid arthritis, and CKD stage IV presents with complaints of chest pain over the last 2 days.  He has been admitted for chest pain evaluation with concerns for unstable angina in the setting of elevated troponin with prior history of significant CAD with CABG x 5 and multiple PCI's.  He is also noted to have possible community-acquired pneumonia and has been started on Rocephin and azithromycin.  2D echocardiogram pending, appreciate cardiology consultation with plans to start Imdur today given chest pain. Palliative care consulted and plan to go home with hospice once medically stable.  Chest pain improved, breathing slowly getting to baseline.  Plan to d/c home with hospice after hospital bed delivered which is delayed until 1/8.  Assessment and Plan: Elevated troponin CAD s/p CABG  Acute.   Patient presents with complaints of chest pain with exertion concerning for  unstable angina.  High-sensitivity troponin elevated at 123.  Patient with prior history of CABG back in 2001 and multiple prior PCI's.  Last cardiac catheterization from 08/2015 noted patent LIMA- LAD, occluded SVG-RCA, SVG to diagonal with PCI/DES of SVG to OM for severe stenosis of proximal body of graft for in-stent restenosis and recommended life long DAPT with ASA/plavix.  -Heparin drip stopped -Continue aspirin, plavix, cardioselective BB, imdur. -Appreciate cardiology: Appreciate palliative care team's discussion, DNR.  Good hospice candidate. He is not a candidate for cardiac rehabilitation.  ok to add lasix daily   Possible community-acquired pneumonia chronic respiratory failure with hypoxemia Patient requiring 2 L nasal cannula oxygen continuously but normally only on 2 L of nasal cannula oxygen at night.  Reports that he has had a cough.  Chest x-ray concerning for either  multifocal pneumonia and/or asymmetric pulmonary edema.  On differential also includes the possibility of aspiration given prior history of achalasia and dysphagia. Complete abx for PNA. Diuresed.  - Continuing supplemental oxygen continuously to maintain adequate oxygenation as well as avoid respiratory distress.  - Continue po lasix as below   Acute on chronic HFrEF:  Suspect acute on chronic.  On physical exam patient does not appear grossly fluid overloaded.  Had been told to stop Lasix last week unless weight greater than 195.  BNP currently elevated at 447.8.  Last echocardiogram noted EF to be 40 to 45% with grade 1 diastolic dysfunction 40/3474.   - Lasix '40mg'$  po daily.  - Strict I&Os and daily weights   Leukocytosis: Stable. Received 5 days of ceftriaxone and azithromycin without change, remained afebrile throughout. Could  consider recheck at follow up if goals of care were not hospice-centered.   Acute kidney injury superimposed on chronic kidney disease stage IV On admission creatinine elevated up to 2.85  with BUN 45.  Creatinine previously noted to be 2.35 on 10/30. - Will not plan to continue BMP checks or outpatient nephrology follow up given his goals of care. - Follows with Dr. Royce Macadamia outpatient   Diabetes mellitus type 2 with hyperglycemia, without long-term use of insulin Hb A1c 8.7% in 01/18/2022.  - Giving basal-bolus insulin while inpatient. Will continue that for now, but should be able to return to [place] at discharge alone (vs. purely comfort measures off glycemic medication). Would avoid metformin with low CrCl. There is concern for inability to administer insulin at home as well. With hospice at home, would not encourage CBG checks either. He would not be expected to be very symptomatic from that level of hyperglycemia and risks hypoglycemia as his oral intake declines.   Bronchiectasis: Patient followed by Dr. Estanislado Pandy of rheumatology and Dr. Chase Caller of pulmonology in the outpatient setting for bronchiectasis and ILD with RA. - Continue controlled inhaler, will add neb and albuterol prn. Continue antitussive and pulmonary hygiene   Essential hypertension: Stable - Continue isosorbide as antianginal.   History of bladder cancer - Has followed with urology. Has no new LUTS. Continue tamsulosin, finasteride.     Hyperlipidemia - No need to continue rosuvastatin with home hospice.   GERD - Continue protonix  Consultants: Cardiology, palliative care Procedures performed: None  Disposition:  Home with hospice Diet recommendation: As desired DISCHARGE MEDICATION: Allergies as of 04/12/2022       Reactions   Metronidazole    Kidney Dr does not want pt to take    Doxazosin Mesylate Other (See Comments)   dizziness   Methocarbamol Rash        Medication List     STOP taking these medications    amLODipine 5 MG tablet Commonly known as: NORVASC   Ferrous Sulfate 28 MG Tabs   losartan 25 MG tablet Commonly known as: COZAAR   rosuvastatin 10 MG tablet Commonly  known as: CRESTOR       TAKE these medications    acetaminophen 325 MG tablet Commonly known as: TYLENOL Take 650 mg by mouth every 6 (six) hours as needed for mild pain.   albuterol (2.5 MG/3ML) 0.083% nebulizer solution Commonly known as: PROVENTIL Take 3 mLs (2.5 mg total) by nebulization every 6 (six) hours as needed for wheezing or shortness of breath.   aspirin EC 81 MG tablet Take 81 mg by mouth at bedtime.   clopidogrel 75 MG tablet Commonly known as: PLAVIX Take 1 tablet (75 mg total) by mouth daily.   finasteride 5 MG tablet Commonly known as: PROSCAR Take 5 mg by mouth daily.   fluticasone 0.05 % cream Commonly known as: CUTIVATE Apply 1 application. topically daily as needed (irritation).   furosemide 40 MG tablet Commonly known as: LASIX Take 1 tablet (40 mg total) by mouth daily.   glipiZIDE 5 MG tablet Commonly known as: GLUCOTROL Take 1 tablet (5 mg total) by mouth 2 (two) times daily before a meal.   glucose blood test strip Commonly known as: ONE TOUCH ULTRA TEST USE TO CHECK BLOOD SUGAR ONCE A DAY Dx Code E11.40   HYDROcodone-acetaminophen 10-325 MG tablet Commonly known as: NORCO TAKE 1 TABLET BY MOUTH EVERY MORNING ANDAT BEDTIME What changed: See the new instructions.   ICAPS AREDS  2 PO Take 1 capsule by mouth daily.   isosorbide mononitrate 60 MG 24 hr tablet Commonly known as: IMDUR Take 1 tablet (60 mg total) by mouth daily.   ketoconazole 2 % shampoo Commonly known as: NIZORAL Apply 1 application. topically 2 (two) times a week.   LORazepam 0.5 MG tablet Commonly known as: ATIVAN Take 0.5 tablets (0.25 mg total) by mouth every 6 (six) hours as needed for anxiety.   metoprolol succinate 100 MG 24 hr tablet Commonly known as: TOPROL-XL Take 1 tablet (100 mg total) by mouth daily. Take with or immediately following a meal.   nitroGLYCERIN 0.4 MG SL tablet Commonly known as: NITROSTAT DISSOLVE 1 TABLET UNDER TONGUE AS NEEDEDFOR  CHEST PAIN. MAY REPEAT 5 MINUTES APART 3 TIMES IF NEEDED What changed: See the new instructions.   nortriptyline 10 MG capsule Commonly known as: PAMELOR Take 1-2 capsules (10-20 mg total) by mouth at bedtime.   ondansetron 4 MG tablet Commonly known as: ZOFRAN Take 1 tablet (4 mg total) by mouth every 8 (eight) hours. What changed:  when to take this reasons to take this   ONE TOUCH ULTRA 2 w/Device Kit Use to obtain blood sugar daily. Dx Code F16.38   OneTouch Delica Lancets 46K Misc 1 each by In Vitro route daily. Dx Code E11.49   oxyCODONE 5 MG/5ML solution Commonly known as: ROXICODONE Take 2.5 mLs (2.5 mg total) by mouth every 2 (two) hours as needed (shortness of breath).   OXYGEN Inhale 2 L into the lungs at bedtime. May use in the day time if he gets "winded"   pantoprazole 40 MG tablet Commonly known as: PROTONIX TAKE 1 TABLET BY MOUTH TWICE (2) DAILY What changed: See the new instructions.   polyethylene glycol 17 g packet Commonly known as: MIRALAX / GLYCOLAX Take 17 g by mouth every other day.   pregabalin 50 MG capsule Commonly known as: LYRICA Take 1 capsule (50 mg total) by mouth at bedtime.   Spiriva Respimat 2.5 MCG/ACT Aers Generic drug: Tiotropium Bromide Monohydrate Inhale 2 puffs into the lungs daily.   tamsulosin 0.4 MG Caps capsule Commonly known as: FLOMAX Take 0.4 mg by mouth at bedtime.               Durable Medical Equipment  (From admission, onward)           Start     Ordered   04/12/22 0000  For home use only DME Nebulizer machine       Question Answer Comment  Patient needs a nebulizer to treat with the following condition Bronchiectasis ([place])   Length of Need 6 Months      04/12/22 0951            Follow-up Information     [name], MD Follow up.   Specialties: Internal Medicine, Pediatrics Contact information: [place] [address] [phone]                 Discharge Exam: Danley Danker Weights   04/10/22 0346 04/11/22 0525 04/12/22 0514  Weight: 85.7 kg 85.8 kg 85.4 kg  BP 120/80   Pulse 93   Temp (!) 97.5 F (36.4 C) (Oral)   Resp 20   Ht 6' (1.829 m)   Wt 85.4 kg   SpO2 91%   BMI 25.53 kg/m   Gen: Chronically ill-appearing elderly gentleman in no distress  Pulm: Coarse with good aeration. No wheezes. Slightly labored having taken  his oxygen off.  CV: RRR, no pitting edema GI: Soft, NT, ND, +BS  Neuro: Alert and oriented. No new focal deficits. Ext: Warm, no deformities Skin: No new rashes, lesions or ulcers on visualized skin   Condition at discharge: stable  The results of significant diagnostics from this hospitalization (including imaging, microbiology, ancillary and laboratory) are listed below for reference.   Imaging Studies: DG CHEST PORT 1 VIEW  Result Date: 04/06/2022 CLINICAL DATA:  Dyspnea. EXAM: PORTABLE CHEST 1 VIEW COMPARISON:  04/03/2022 FINDINGS: Stable mild cardiac enlargement. Previous median sternotomy and CABG procedure. Moderate increased interstitial markings are identified throughout both lungs. Blunting of the costophrenic angles. Decreased aeration to the left base may represent atelectasis or airspace disease. IMPRESSION: 1. Moderate increased interstitial markings throughout both lungs compatible with CHF. 2. Decreased aeration to the left base may represent atelectasis or airspace disease. Electronically Signed   By: Kerby Moors M.D.   On: 04/06/2022 13:13   ECHOCARDIOGRAM COMPLETE  Result Date: 04/04/2022    ECHOCARDIOGRAM REPORT   Patient Name:   [name] Date of Exam: 04/04/2022 Medical Rec #:  [id number]       Height:       72.0 in Accession #:    [id number]      Weight:       191.3 lb Date of Birth:  1931-06-06       BSA:          2.091 m Patient Age:    33 years        BP:           95/59 mmHg Patient Gender: M               HR:           107 bpm. Exam Location:  Inpatient Procedure: 2D Echo,  Cardiac Doppler, Color Doppler and Intracardiac            Opacification Agent Indications:    CHF-Acute systolic  History:        Patient has prior history of Echocardiogram examinations, most                 recent 02/01/2022. CHF, CAD, Prior CABG, Aortic Valve Disease,                 Arrythmias:Tachycardia; Risk Factors:Sleep Apnea, Hypertension,                 Diabetes and Dyslipidemia. CKD.  Sonographer:    Clayton Lefort RDCS (AE) Referring Phys: Rosalene Billings ROBERTS IMPRESSIONS  1. No LV thrombus seen with Definity contrast. Left ventricular ejection fraction, by estimation, is 25 to 30%. The left ventricle has severely decreased function. The left ventricle demonstrates regional wall motion abnormalities (see scoring diagram/findings for description). The left ventricular internal cavity size was mildly dilated. There is mild concentric left ventricular hypertrophy. Left ventricular diastolic parameters are consistent with Grade II diastolic dysfunction (pseudonormalization). Elevated left atrial pressure. There is severe hypokinesis of the left ventricular, entire inferolateral wall. There is severe hypokinesis of the left ventricular, entire apical segment. There is mild hypokinesis of the left ventricular, basal-mid anteroseptal wall, anterior wall and inferior wall.  2. Right ventricular systolic function is normal. The right ventricular size is normal. Tricuspid regurgitation signal is inadequate for assessing PA pressure.  3. Left atrial size was mildly dilated.  4. The mitral valve is normal in structure. Mild to moderate mitral valve regurgitation. Moderate mitral annular calcification.  5. Low flow low gradient aortic stenosis. The aortic valve was not well visualized. There is moderate calcification of the aortic valve. There is moderate thickening of the aortic valve. Aortic valve regurgitation is not visualized. Moderate to severe aortic valve stenosis.  6. The inferior vena cava is normal in size  with greater than 50% respiratory variability, suggesting right atrial pressure of 3 mmHg. Comparison(s): The left ventricular function is significantly worse. The left ventricular wall motion abnormalities are new. FINDINGS  Left Ventricle: No LV thrombus seen with Definity contrast. Left ventricular ejection fraction, by estimation, is 25 to 30%. The left ventricle has severely decreased function. The left ventricle demonstrates regional wall motion abnormalities. Severe hypokinesis of the left ventricular, entire inferolateral wall. Severe hypokinesis of the left ventricular, entire apical segment. Mild hypokinesis of the left ventricular, basal-mid anteroseptal wall, anterior wall and inferior wall. Definity contrast agent was given IV to delineate the left ventricular endocardial borders. The left ventricular internal cavity size was mildly dilated. There is mild concentric left ventricular hypertrophy. Left ventricular diastolic parameters are consistent with Grade  II diastolic dysfunction (pseudonormalization). Elevated left atrial pressure. Right Ventricle: The right ventricular size is normal. No increase in right ventricular wall thickness. Right ventricular systolic function is normal. Tricuspid regurgitation signal is inadequate for assessing PA pressure. Left Atrium: Left atrial size was mildly dilated. Right Atrium: Right atrial size was normal in size. Pericardium: There is no evidence of pericardial effusion. Mitral Valve: The mitral valve is normal in structure. There is mild thickening of the mitral valve leaflet(s). Moderate mitral annular calcification. Mild to moderate mitral valve regurgitation, with centrally-directed jet. Tricuspid Valve: The tricuspid valve is normal in structure. Tricuspid valve regurgitation is not demonstrated. Aortic Valve: Low flow low gradient aortic stenosis. The aortic valve was not well visualized. There is moderate calcification of the aortic valve. There is  moderate thickening of the aortic valve. Aortic valve regurgitation is not visualized. Moderate to severe aortic stenosis is present. Aortic valve mean gradient measures 12.0 mmHg. Aortic valve peak gradient measures 18.0 mmHg. Aortic valve area, by VTI measures 1.03 cm. Pulmonic Valve: The pulmonic valve was normal in structure. Pulmonic valve regurgitation is not visualized. Aorta: The aortic root and ascending aorta are structurally normal, with no evidence of dilitation. Venous: The inferior vena cava is normal in size with greater than 50% respiratory variability, suggesting right atrial pressure of 3 mmHg. IAS/Shunts: No atrial level shunt detected by color flow Doppler.  LEFT VENTRICLE PLAX 2D LVIDd:         5.30 cm LVIDs:         4.40 cm LV PW:         1.20 cm LV IVS:        1.20 cm LVOT diam:     2.20 cm LV SV:         38 LV SV Index:   18 LVOT Area:     3.80 cm  LV Volumes (MOD) LV vol d, MOD A4C: 183.0 ml LV vol s, MOD A4C: 125.0 ml LV SV MOD A4C:     183.0 ml RIGHT VENTRICLE             IVC RV Basal diam:  3.00 cm     IVC diam: 1.90 cm RV S prime:     11.50 cm/s TAPSE (M-mode): 2.2 cm LEFT ATRIUM             Index  RIGHT ATRIUM           Index LA diam:        4.20 cm 2.01 cm/m   RA Area:     15.60 cm LA Vol (A2C):   52.0 ml 24.87 ml/m  RA Volume:   37.20 ml  17.79 ml/m LA Vol (A4C):   56.1 ml 26.83 ml/m LA Biplane Vol: 54.1 ml 25.88 ml/m  AORTIC VALVE AV Area (Vmax):    1.13 cm AV Area (Vmean):   0.98 cm AV Area (VTI):     1.03 cm AV Vmax:           212.00 cm/s AV Vmean:          164.000 cm/s AV VTI:            0.367 m AV Peak Grad:      18.0 mmHg AV Mean Grad:      12.0 mmHg LVOT Vmax:         63.20 cm/s LVOT Vmean:        42.300 cm/s LVOT VTI:          0.099 m LVOT/AV VTI ratio: 0.27  AORTA Ao Root diam: 3.20 cm Ao Asc diam:  3.30 cm  SHUNTS Systemic VTI:  0.10 m Systemic Diam: 2.20 cm Dani Gobble Croitoru MD Electronically signed by Sanda Klein MD Signature Date/Time: 04/04/2022/12:58:25  PM    Final    DG Chest Port 1 View  Result Date: 04/03/2022 CLINICAL DATA:  Chest pain EXAM: PORTABLE CHEST 1 VIEW COMPARISON:  Previous chest radiograph done on 01/27/2021, CT chest done on 03/04/2021 FINDINGS: Transverse diameter of heart is slightly increased. There is previous coronary bypass surgery. Central pulmonary vessels are prominent. Increased interstitial markings are seen in parahilar regions and lower lung fields. There is minimal blunting of right lateral CP angle. There is no pneumothorax. IMPRESSION: Increased interstitial markings are seen in right parahilar region and both lower lung fields. Findings suggest possible multifocal pneumonia. Part of this finding may be due to asymmetric interstitial edema. Minimal right pleural effusion. Electronically Signed   By: Elmer Picker M.D.   On: 04/03/2022 13:10    Microbiology: Results for orders placed or performed during the hospital encounter of 04/03/22  Culture, blood (routine x 2)     Status: None   Collection Time: 04/03/22  2:41 PM   Specimen: BLOOD RIGHT HAND  Result Value Ref Range Status   Specimen Description BLOOD RIGHT HAND  Final   Special Requests   Final    BOTTLES DRAWN AEROBIC AND ANAEROBIC Blood Culture results may not be optimal due to an inadequate volume of blood received in culture bottles   Culture   Final    NO GROWTH 5 DAYS Performed at [facility], [place] [address] [place], [place] [address]    Report Status 04/08/2022 FINAL  Final  Resp panel by RT-PCR (RSV, Flu A&B, Covid) Anterior Nasal Swab     Status: None   Collection Time: 04/03/22  3:00 PM   Specimen: Anterior Nasal Swab  Result Value Ref Range Status   SARS Coronavirus 2 by RT PCR NEGATIVE NEGATIVE Final    Comment: (NOTE) SARS-CoV-2 target nucleic acids are NOT DETECTED.  The SARS-CoV-2 RNA is generally detectable in upper respiratory specimens during the acute phase of infection. The lowest concentration of SARS-CoV-2  viral copies this assay can detect is 138 copies/mL. A negative result does not preclude SARS-Cov-2 infection and should not be  used as the sole basis for treatment or other patient management decisions. A negative result may occur with  improper specimen collection/handling, submission of specimen other than nasopharyngeal swab, presence of viral mutation(s) within the areas targeted by this assay, and inadequate number of viral copies(<138 copies/mL). A negative result must be combined with clinical observations, patient history, and epidemiological information. The expected result is Negative.  Fact Sheet for Patients:  EntrepreneurPulse.com.au  Fact Sheet for Healthcare Providers:  IncredibleEmployment.be  This test is no t yet approved or cleared by the [place] FDA and  has been authorized for detection and/or diagnosis of SARS-CoV-2 by FDA under an Emergency Use Authorization (EUA). This EUA will remain  in effect (meaning this test can be used) for the duration of the COVID-19 declaration under Section 564(b)(1) of the Act, 21 U.S.C.section 360bbb-3(b)(1), unless the authorization is terminated  or revoked sooner.       Influenza A by PCR NEGATIVE NEGATIVE Final   Influenza B by PCR NEGATIVE NEGATIVE Final    Comment: (NOTE) The Xpert Xpress SARS-CoV-2/FLU/RSV plus assay is intended as an aid in the diagnosis of influenza from Nasopharyngeal swab specimens and should not be used as a sole basis for treatment. Nasal washings and aspirates are unacceptable for Xpert Xpress SARS-CoV-2/FLU/RSV testing.  Fact Sheet for Patients: EntrepreneurPulse.com.au  Fact Sheet for Healthcare Providers: IncredibleEmployment.be  This test is not yet approved or cleared by the [place] FDA and has been authorized for detection and/or diagnosis of SARS-CoV-2 by FDA under an Emergency Use Authorization  (EUA). This EUA will remain in effect (meaning this test can be used) for the duration of the COVID-19 declaration under Section 564(b)(1) of the Act, 21 U.S.C. section 360bbb-3(b)(1), unless the authorization is terminated or revoked.     Resp Syncytial Virus by PCR NEGATIVE NEGATIVE Final    Comment: (NOTE) Fact Sheet for Patients: EntrepreneurPulse.com.au  Fact Sheet for Healthcare Providers: IncredibleEmployment.be  This test is not yet approved or cleared by the [place] FDA and has been authorized for detection and/or diagnosis of SARS-CoV-2 by FDA under an Emergency Use Authorization (EUA). This EUA will remain in effect (meaning this test can be used) for the duration of the COVID-19 declaration under Section 564(b)(1) of the Act, 21 U.S.C. section 360bbb-3(b)(1), unless the authorization is terminated or revoked.  Performed at [facility], [place] [address] [place], [name] [address]   Culture, blood (routine x 2)     Status: None   Collection Time: 04/03/22  4:33 PM   Specimen: BLOOD  Result Value Ref Range Status   Specimen Description BLOOD RIGHT ANTECUBITAL  Final   Special Requests   Final    BOTTLES DRAWN AEROBIC ONLY Blood Culture results may not be optimal due to an inadequate volume of blood received in culture bottles   Culture   Final    NO GROWTH 5 DAYS Performed at [facility], [place] [address] [place], [place] [address]    Report Status 04/08/2022 FINAL  Final   *Note: Due to a large number of results and/or encounters for the requested time period, some results have not been displayed. A complete set of results can be found in Results Review.    Labs: CBC: Recent Labs  Lab 04/06/22 0245 04/07/22 0148  WBC 9.1 10.7*  HGB 11.5* 11.8*  HCT 34.5* 35.1*  MCV 84.6 84.2  PLT 207 315   Basic Metabolic Panel: Recent Labs  Lab 04/06/22 0245  04/07/22 0148 04/08/22 0907 04/09/22 0545  NA 132*  131* 135 134*  K 4.3 4.0 4.1 4.2  CL 96* 96* 98 98  CO2 22 21* 24 25  GLUCOSE 164* 175* 205* 262*  BUN 65* 72* 82* 91*  CREATININE 3.00* 2.83* 2.76* 2.65*  CALCIUM 9.2 9.3 9.2 9.5   Liver Function Tests: No results for input(s): "AST", "ALT", "ALKPHOS", "BILITOT", "PROT", "ALBUMIN" in the last 168 hours. CBG: Recent Labs  Lab 04/11/22 0805 04/11/22 1135 04/11/22 1820 04/11/22 2056 04/12/22 0837  GLUCAP 244* 251* 290* 322* 218*    Discharge time spent: greater than 30 minutes.  Signed: [name], MD [facility] 04/12/2022

## 2022-04-12 NOTE — TOC Transition Note (Addendum)
Transition of Care [facility]) - CM/SW Discharge Note   Patient Details  Name: [name] MRN: [id number] Date of Birth: 14-May-1931  Transition of [facility]) CM/SW Contact:  [name] Roys, RN Phone Number: 04/12/2022, 11:17 AM   Clinical Narrative:  Patient will transition home today with a referral to [facility] for Hospice Services. [facility] to have a RN visit the home for the admission visit. DME has arrived at the home and daughter [name] is agreeable to Case Manager calling [facility] to schedule transportation for 11:40 am. Case Manager has scheduled transportation- Diplomatic Services operational officer and MD are aware. No further needs identified at this time.    04-12-22 Case Manager received notification that the patient did not receive medications via the [place] prior to leaving. Daughter is willing to come back to the hospital for medications. Staff RN to go over discharge instructions with [name]. No further needs identified.  Final next level of care: Home w Hospice Care Barriers to Discharge: No Barriers Identified   Patient Goals and CMS Choice CMS Medicare.gov Compare Post Acute Care list provided to:: Patient Choice offered to / list presented to : Patient, Adult Children  Discharge Plan and Services Additional resources added to the After Visit Summary for   In-house Referral: NA Discharge Planning Services: CM Consult Post Acute Care Choice: Hospice            DME Agency: NA     Social Determinants of Health (SDOH) Interventions SDOH Screenings   Depression (PHQ2-9): Low Risk  (12/02/2020)  Financial Resource Strain: Low Risk  (07/25/2020)  Tobacco Use: Medium Risk (04/03/2022)  Readmission Risk Interventions    04/07/2022    4:44 PM  Readmission Risk Prevention Plan  Transportation Screening Complete  Medication Review (RN Care Manager) Referral to Pharmacy  [place] or [place] Complete  SW Recovery Care/Counseling Consult Complete  Palliative Care Screening  Complete  [place] Not Applicable

## 2022-04-13 ENCOUNTER — Telehealth: Payer: Self-pay

## 2022-04-13 NOTE — Telephone Encounter (Signed)
Spoke to pt's daughter, [name]. She said he is resting right now. She would like to establish home visits. I canceled his appt for 04-22-22.

## 2022-04-14 NOTE — Telephone Encounter (Signed)
I will probably try to go out there next week

## 2022-04-15 ENCOUNTER — Telehealth: Payer: Self-pay | Admitting: Internal Medicine

## 2022-04-15 MED ORDER — ISOSORBIDE MONONITRATE ER 60 MG PO TB24: 60.0000 mg | ORAL_TABLET | Freq: Every day | ORAL | 11 refills | Status: DC

## 2022-04-15 MED ORDER — METOPROLOL SUCCINATE ER 100 MG PO TB24: 100.0000 mg | ORAL_TABLET | Freq: Every day | ORAL | 11 refills | Status: DC

## 2022-04-15 MED ORDER — FUROSEMIDE 40 MG PO TABS: 40.0000 mg | ORAL_TABLET | Freq: Every day | ORAL | 11 refills | Status: DC

## 2022-04-15 NOTE — Telephone Encounter (Signed)
Sent prescriptions to the pharmacy. Called and spoke to [place]. She said Wednesday afternoon will be fine. Although, she feels he may not be here.

## 2022-04-15 NOTE — Telephone Encounter (Signed)
Pharmacy would like to know if medications lasix '40mg'$  ,motoprolol ext release 100 mg ,isosorbide-mono nitrate er '60mg'$  can be sent in for this patient?They stated that he has just been released from the hospital and these are the medications that he's on now.He is under hopsice care now,under Dr Silvio Pate.

## 2022-04-15 NOTE — Addendum Note (Signed)
Addended by: Pilar Grammes on: 04/15/2022 04:56 PM   Modules accepted: Orders

## 2022-04-16 ENCOUNTER — Ambulatory Visit: Payer: Medicare Other | Admitting: Physician Assistant

## 2022-04-16 NOTE — Telephone Encounter (Signed)
Noted I will call them on Monday

## 2022-04-17 DIAGNOSIS — R0689 Other abnormalities of breathing: Secondary | ICD-10-CM | POA: Diagnosis not present

## 2022-04-17 DIAGNOSIS — J8410 Pulmonary fibrosis, unspecified: Secondary | ICD-10-CM | POA: Diagnosis not present

## 2022-04-19 ENCOUNTER — Telehealth: Payer: Self-pay | Admitting: Internal Medicine

## 2022-04-19 MED ORDER — MORPHINE SULFATE (CONCENTRATE) 10 MG /0.5 ML PO SOLN: 5.0000 mg | ORAL | 0 refills | Status: AC | PRN

## 2022-04-19 MED ORDER — LORAZEPAM 0.5 MG PO TABS: 0.2500 mg | ORAL_TABLET | Freq: Four times a day (QID) | ORAL | 0 refills | Status: AC | PRN

## 2022-04-19 NOTE — Telephone Encounter (Signed)
Spoke to [place].

## 2022-04-19 NOTE — Telephone Encounter (Signed)
I did not see morphine on his list---but that is what they should be using. Discontinued the oxycodone and hydrocodone  Not worth checking urine at this point. I will plan to visit Wednesday afternoon

## 2022-04-19 NOTE — Telephone Encounter (Signed)
Lisa from Hospice called in and was wondering if Dr. Silvio Pate would like for an urine specimen to be collected on the patient. She stated she placed a catheter and it came back thick and white. She stated that the patient may pass in the next day or two. She is requesting a refill of Morphine 5 MG and he is taking it every 2hrs as needed. Also needs a refill of Lorazepam 0.5 MG and he is taking it every 4hrs as needed. Please advise. Thank you!

## 2022-04-19 NOTE — Addendum Note (Signed)
Addended by: Viviana Simpler I on: 04/19/2022 12:12 PM   Modules accepted: Orders

## 2022-04-20 ENCOUNTER — Encounter: Payer: Self-pay | Admitting: Internal Medicine

## 2022-04-20 NOTE — Telephone Encounter (Signed)
April from Hospice called over and wanted to get clarification on Lorazepam she is heading to the patients house now. She stated that he usually takes 0.5 MG every 4 hrs as needed but it was sent in as every 6 hrs as needed. She just want to make sure they can still do every 4 hrs the whole 0.5 MG as prescribed by Dr. Gilford Rile. She can be reached at [phone] or can call her on her personal number at [phone] if work phone doesn't go through. Thank you!

## 2022-04-20 NOTE — Telephone Encounter (Signed)
Called and spoke to [place]. She thought it was an in office visit. I advised it was a home visit. She does want to keep the appointment.

## 2022-04-20 NOTE — Telephone Encounter (Signed)
Left a detailed message on verified VM for April.

## 2022-04-21 ENCOUNTER — Ambulatory Visit: Payer: Medicare Other | Admitting: Internal Medicine

## 2022-04-21 ENCOUNTER — Encounter: Payer: Self-pay | Admitting: Internal Medicine

## 2022-04-21 VITALS — BP 100/56 | HR 90 | Resp 10

## 2022-04-21 DIAGNOSIS — J9611 Chronic respiratory failure with hypoxia: Secondary | ICD-10-CM | POA: Diagnosis not present

## 2022-04-21 DIAGNOSIS — J849 Interstitial pulmonary disease, unspecified: Secondary | ICD-10-CM

## 2022-04-21 DIAGNOSIS — I5020 Unspecified systolic (congestive) heart failure: Secondary | ICD-10-CM | POA: Diagnosis not present

## 2022-04-21 NOTE — Assessment & Plan Note (Signed)
Now in terminal stage Comfort care Expect death within a week

## 2022-04-21 NOTE — Assessment & Plan Note (Signed)
Now dying Off meds without clear fluid issues On hospice care Discussed use of roxanol and lorazepam for comfort

## 2022-04-21 NOTE — Progress Notes (Signed)
Subjective:    Patient ID: [name], male    DOB: [birth year], 89 y.o.   MRN: [id number]  HPI Home visit for follow up of recent hospitalization and starting hospice care Wife, sitter Shirlean Mylar) and 2 daughters  Reviewed past hospitalization CHF exacerbation and possible pneumonia Decision for hospice care Has declined significantly  Not eating anything Bedbound Hospice coming for bed bath every other day Meds all stopped other than lorazpeam, morphine. Hyoscymine ----prn  Agitated at first Even fell out of bed   Has had significant apnea periods No persistent breathing problems  Current Outpatient Medications on File Prior to Visit  Medication Sig Dispense Refill   LORazepam (ATIVAN) 0.5 MG tablet Take 0.5 tablets (0.25 mg total) by mouth every 6 (six) hours as needed for anxiety. 5 tablet 0   Morphine Sulfate (MORPHINE CONCENTRATE) 10 mg / 0.5 ml concentrated solution Take 0.25-0.5 mLs (5-10 mg total) by mouth every hour as needed for severe pain. 30 mL 0   OXYGEN Inhale 2 L into the lungs at bedtime. May use in the day time if he gets "winded"     No current facility-administered medications on file prior to visit.    Allergies  Allergen Reactions   Metronidazole     Kidney Dr does not want pt to take    Doxazosin Mesylate Other (See Comments)    dizziness   Methocarbamol Rash    Past Medical History:  Diagnosis Date   Aortic stenosis    Arthritis    osteoarthritis, s/p R TKR, and digits   CAD (coronary artery disease)    a. s/p CABG (2001)  b. s/p DES to RCA and cutting POBA to ostial PDA (2013)   c. s/p DES to SVG to OM2 (01/14/14) d. cath: 08/2015 NSTEMI w/ patent LIMA-LAD and 99% stenosis of SVG-OM w/ DES placed. CTO of SVG-RCA and SVG-D1.    Cancer [facility])    bladder  and skin   Cataract    Chronic diastolic CHF (congestive heart failure) ([place])    a) 09/13 ECHO- LVEF 38-75%, grade 1 diastolic dysfunction, mild LA dilatation, atrial septal aneurysm, AV  mobility restricted, but no sig AS by doppler; b) 09/04/08 ECHO- LVH, ef 60%, mild AS, c. echo 08/2015: EF perserved of 55-60% with inferolateral HK. Mild AS noted.   Chronic kidney disease, stage III (moderate) ([name])    Chronic lower back pain    Colon polyps    COVID-19    Diverticulosis    Dyspnea 2009 since July -Sept   05/06/08-CPST-  normal effort, reduced VO2 max 20.5 /65%, reduced at 8.2/ 40%, normal breathing resetvca of 55%, submaximal heart rate response 112/77%, flattened o2 pluse response at peak exercise-12 ml/beat @ 85%, No VQ mismatch abnormalities, All c/w CIRC Limitation   Enlarged prostate    Esophageal stricture    a. s/p dilation spring 2010   GERD (gastroesophageal reflux disease)    Heart murmur    Hiatal hernia    History of carpal tunnel syndrome    Bilateral   History of kidney stones    History of PFTs    mixed pattern on spiro. mild restn on lung volumes with near normal DLCO. Pattern can be explained by CABG scar. Fev1 2.2L/73%, ratio 68 (67), TLC 4.7/68%,RV 1.5L/55%,DLCO 79%   Hyperlipidemia    Hypertension    Interstitial lung disease ([name])    NOS   Iron deficiency anemia    Myocardial infarction ([place])  Nausea & vomiting    2018/2019   On home oxygen therapy    2 L [place] at bedtime   Osteoporosis    Overweight (BMI 25.0-29.9)    BMI 29   Peripheral neuropathy    RA (rheumatoid arthritis) ([place])    [name]   Seropositive rheumatoid arthritis ([place])    Type II diabetes mellitus ([place])    diet controlled   Wears glasses    Wears partial dentures    upper    Past Surgical History:  Procedure Laterality Date   BALLOON DILATION N/A 09/12/2018   Procedure: BALLOON DILATION;  Surgeon: Irene Shipper, MD;  Location: [facility];  Service: Endoscopy;  Laterality: N/A;   BALLOON DILATION N/A 02/20/2020   Procedure: BALLOON DILATION;  Surgeon: Irene Shipper, MD;  [facility]: [facility];  Service: Endoscopy;  Laterality: N/A;   BALLOON DILATION N/A  08/24/2021   Procedure: BALLOON DILATION;  Surgeon: Irene Shipper, MD;  Location: [facility];  Service: Gastroenterology;  Laterality: N/A;   BOTOX INJECTION N/A 09/12/2018   Procedure: BOTOX INJECTION;  Surgeon: Irene Shipper, MD;  [facility];  Service: Endoscopy;  Laterality: N/A;   BOTOX INJECTION N/A 11/27/2018   Procedure: BOTOX INJECTION;  Surgeon: Irene Shipper, MD;  [facility];  Service: Endoscopy;  Laterality: N/A;   BOTOX INJECTION N/A 02/20/2020   Procedure: BOTOX INJECTION;  Surgeon: Irene Shipper, MD;  [facility];  Service: Endoscopy;  Laterality: N/A;   BOTOX INJECTION  08/24/2021   Procedure: BOTOX INJECTION;  Surgeon: Irene Shipper, MD;  [facility];  Service: Gastroenterology;;   CARDIAC CATHETERIZATION  08/2004   CP- no MI, Cath- small vessell disease    CARDIAC CATHETERIZATION  12/31/2011   80% distal LM, 100% native LAD, LCx and RCA, 30% prox SVG-OM, SVG-D1 normal, 99% distal, 80% ostial SVG-RCA distal to graft, LIMA-LAD normal; LVEF mildly decreased with posterior basal AK    CARDIAC CATHETERIZATION  2009   with patent grafts/notes 12/31/2011   CARDIAC CATHETERIZATION N/A 08/13/2015   Procedure: Left Heart Cath and Cors/Grafts Angiography;  Surgeon: Sherren Mocha, MD;  Location: [facility];  Service: Cardiovascular;  Laterality: N/A;   CATARACT EXTRACTION W/ INTRAOCULAR LENS  IMPLANT, BILATERAL Bilateral    CHOLECYSTECTOMY OPEN  11/2003   [name]   CORONARY ANGIOPLASTY WITH STENT PLACEMENT  01/03/2012   Successful DES to SVG-RCA and cutting balloon angioplasty ostial  PDA    CORONARY ANGIOPLASTY WITH STENT PLACEMENT  01/14/2014   "1"   CORONARY ARTERY BYPASS GRAFT  11/1999   CABG X5   CORONARY STENT PLACEMENT  02/2012   1 stent and balloon   CYSTOSCOPY WITH BIOPSY N/A 03/17/2021   Procedure: DIAGNOSTIC CYSTOSCOPY WITH BIOPSY;  Surgeon: Robley Fries, MD;  [facility];  Service: Urology;  Laterality: N/A;  1 HR    CYSTOSCOPY WITH RETROGRADE PYELOGRAM, URETEROSCOPY AND STENT PLACEMENT Right 07/28/2021   Procedure: CYSTOSCOPY WITH FULGERATION, STENT PLACEMENT, RETROGRADE PYELOGRAM,  DIAGNOSTIC URETEROSCOPY;  Surgeon: Robley Fries, MD;  [facility];  Service: Urology;  Laterality: Right;   ESOPHAGEAL DILATION  11/27/2018   Procedure: ESOPHAGEAL DILATION;  Surgeon: Irene Shipper, MD;  [facility];  Service: Endoscopy;;   ESOPHAGOGASTRODUODENOSCOPY N/A 03/01/2017   Procedure: ESOPHAGOGASTRODUODENOSCOPY (EGD);  Surgeon: Irene Shipper, MD;  [facility];  Service: Endoscopy;  Laterality: N/A;   ESOPHAGOGASTRODUODENOSCOPY (EGD) WITH ESOPHAGEAL DILATION  2010   ESOPHAGOGASTRODUODENOSCOPY (EGD)  WITH PROPOFOL N/A 09/12/2018   Procedure: ESOPHAGOGASTRODUODENOSCOPY (EGD) WITH PROPOFOL;  Surgeon: Irene Shipper, MD;  [facility];  Service: Endoscopy;  Laterality: N/A;   ESOPHAGOGASTRODUODENOSCOPY (EGD) WITH PROPOFOL N/A 11/27/2018   Procedure: ESOPHAGOGASTRODUODENOSCOPY (EGD) WITH PROPOFOL, WITH BALLOON DILATION;  Surgeon: Irene Shipper, MD;  [facility];  Service: Endoscopy;  Laterality: N/A;   ESOPHAGOGASTRODUODENOSCOPY (EGD) WITH PROPOFOL N/A 02/20/2020   Procedure: ESOPHAGOGASTRODUODENOSCOPY (EGD) WITH PROPOFOL;  Surgeon: Irene Shipper, MD;  [facility];  Service: Endoscopy;  Laterality: N/A;   ESOPHAGOGASTRODUODENOSCOPY (EGD) WITH PROPOFOL N/A 08/24/2021   Procedure: ESOPHAGOGASTRODUODENOSCOPY (EGD) WITH PROPOFOL;  Surgeon: Irene Shipper, MD;  [facility];  Service: Gastroenterology;  Laterality: N/A;   EXTRACORPOREAL SHOCK WAVE LITHOTRIPSY Right 05/11/2021   Procedure: EXTRACORPOREAL SHOCK WAVE LITHOTRIPSY (ESWL);  Surgeon: [name], MD;  Location[facility];  Service: Urology;  Laterality: Right;   HAND SURGERY     bilateral carpal tunnel releases   JOINT REPLACEMENT     KNEE ARTHROSCOPY Right 2008   LEFT AND RIGHT  HEART CATHETERIZATION WITH CORONARY ANGIOGRAM  12/31/2011   Procedure: LEFT AND RIGHT HEART CATHETERIZATION WITH CORONARY ANGIOGRAM;  Surgeon: Burnell Blanks, MD;  Location: [facility] CATH LAB;  Service: Cardiovascular;;   LEFT AND RIGHT HEART CATHETERIZATION WITH CORONARY ANGIOGRAM N/A 01/14/2014   Procedure: LEFT AND RIGHT HEART CATHETERIZATION WITH CORONARY ANGIOGRAM;  Surgeon: Peter M Martinique, MD;  Location: [facility] CATH LAB;  Service: Cardiovascular;  Laterality: N/A;   MALONEY DILATION  03/01/2017   Procedure: Venia Minks DILATION;  Surgeon: Irene Shipper, MD;  [facility];  Service: Endoscopy;;   PERCUTANEOUS CORONARY INTERVENTION-BALLOON ONLY  01/03/2012   Procedure: PERCUTANEOUS CORONARY INTERVENTION-BALLOON ONLY;  Surgeon: Peter M Martinique, MD;  Location: [facility] CATH LAB;  Service: Cardiovascular;;   PERCUTANEOUS CORONARY STENT INTERVENTION (PCI-S)  12/31/2011   Procedure: PERCUTANEOUS CORONARY STENT INTERVENTION (PCI-S);  Surgeon: Burnell Blanks, MD;  Location: [facility] CATH LAB;  Service: Cardiovascular;;   PERCUTANEOUS CORONARY STENT INTERVENTION (PCI-S) N/A 01/03/2012   Procedure: PERCUTANEOUS CORONARY STENT INTERVENTION (PCI-S);  Surgeon: Peter M Martinique, MD;  Location[facility] CATH LAB;  Service: Cardiovascular;  Laterality: N/A;   SHOULDER ARTHROSCOPY WITH OPEN ROTATOR CUFF REPAIR AND DISTAL CLAVICLE ACROMINECTOMY Left 02/27/2013   Procedure: LEFT SHOULDER ARTHROSCOPY WITH MINI OPEN ROTATOR CUFF REPAIR AND SUBACROMIAL DECOMPRESSION AND DISTAL CLAVICLE RESECTION;  Surgeon: [name], MD;  Location: [facility];  Service: Orthopedics;  Laterality: Left;   TOTAL KNEE ARTHROPLASTY Right 03/2010   [name]   TRANSURETHRAL RESECTION OF BLADDER TUMOR N/A 03/17/2021   Procedure: TRANSURETHRAL RESECTION OF BLADDER TUMOR (TURBT);  Surgeon: Robley Fries, MD;  [facility];  Service: Urology;  Laterality: N/A;   TRANSURETHRAL RESECTION OF BLADDER TUMOR WITH MITOMYCIN-C N/A 10/08/2020    Procedure: TRANSURETHRAL RESECTION OF BLADDER TUMOR WITH GEMCITABINE;  Surgeon: Robley Fries, MD;  [facility];  Service: Urology;  Laterality: N/A;  75 MINS   TRIGGER FINGER RELEASE Left 02/27/2013   Procedure: RELEASE TRIGGER FINGER/A-1 PULLEY;  Surgeon: [name], MD;  Location: [facility];  Service: Orthopedics;  Laterality: Left;    Family History  Problem Relation Age of Onset   COPD Mother    Heart disease Father    Heart attack Father    Stomach cancer Brother    Stroke Sister    Alcohol abuse Sister    Colon cancer Brother 25   Diabetes Brother  Rectal cancer Neg Hx     Social History   Socioeconomic History   Marital status: Married    Spouse name: Not on file   Number of children: 3   Years of education: Not on file   Highest education level: Not on file  Occupational History   Occupation: Designer, jewellery: RETIRED    Comment: retired  Tobacco Use   Smoking status: Former    Packs/day: 1.00    Years: 20.00    Total pack years: 20.00    Types: Cigarettes    Quit date: 04/06/1963    Years since quitting: 59.0    Passive exposure: Current   Smokeless tobacco: Former  Scientific laboratory technician Use: Never used  Substance and Sexual Activity   Alcohol use: No    Alcohol/week: 0.0 standard drinks of alcohol    Comment: 01/01/2012 "last alcohol ~ 50 yr ago"   Drug use: No   Sexual activity: Not Currently  Other Topics Concern   Not on file  Social History Narrative   No living will   Requests wife as health care POA-- alternate is daughter Hassan Rowan   Discussed DNR --he requests this (done 08/29/12)   Not sure about feeding tube---but might accept for some time   Patient lives with wife and daughter in a one story home.  Has 3 children.  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      Right Handed    Social Determinants of Health   Financial Resource Strain: Low Risk  (07/25/2020)   Overall Financial Resource Strain (CARDIA)     Difficulty of Paying Living Expenses: Not very hard  Food Insecurity: Not on file  Transportation Needs: Not on file  Physical Activity: Not on file  Stress: Not on file  Social Connections: Not on file  Intimate Partner Violence: Not on file   Review of Systems Bowels not moving---has had manual disimpaction at times Some mottling feet and hands Now has Foley---still making urine but very little    Objective:   Physical Exam Constitutional:      Comments: Not awake--restless, slight moaning but not in distress  Cardiovascular:     Rate and Rhythm: Normal rate and regular rhythm.     Heart sounds: No murmur heard.    No gallop.  Pulmonary:     Comments: Decreased breath sounds and slight right basilar crackles Abdominal:     Palpations: Abdomen is soft.     Tenderness: There is no abdominal tenderness.  Musculoskeletal:     Cervical back: No tenderness.     Right lower leg: No edema.     Left lower leg: No edema.  Lymphadenopathy:     Cervical: No cervical adenopathy.            Assessment & Plan:

## 2022-04-21 NOTE — Assessment & Plan Note (Signed)
Part of the issue with his chronic hypoxia No Rx now

## 2022-04-22 ENCOUNTER — Ambulatory Visit: Payer: Medicare Other | Admitting: Internal Medicine

## 2022-04-26 ENCOUNTER — Ambulatory Visit: Payer: Medicare Other | Admitting: Neurology

## 2022-05-06 ENCOUNTER — Ambulatory Visit: Payer: Medicare Other | Admitting: Podiatry

## 2022-05-06 DEATH — deceased

## 2022-05-12 ENCOUNTER — Other Ambulatory Visit: Payer: Medicare Other

## 2022-06-21 ENCOUNTER — Ambulatory Visit: Payer: Medicare Other | Admitting: Cardiovascular Disease

## 2022-09-13 ENCOUNTER — Ambulatory Visit (HOSPITAL_COMMUNITY): Payer: Medicare Other
